# Patient Record
Sex: Female | Born: 1950 | Race: White | Hispanic: No | Marital: Married | Smoking: Never smoker
Health system: Southern US, Community
[De-identification: ages and names within clinical notes are randomized; demographics above are authoritative.]

## PROBLEM LIST (undated history)

## (undated) DIAGNOSIS — N183 Chronic kidney disease, stage 3 unspecified: Secondary | ICD-10-CM

## (undated) DIAGNOSIS — Z7901 Long term (current) use of anticoagulants: Secondary | ICD-10-CM

## (undated) DIAGNOSIS — K219 Gastro-esophageal reflux disease without esophagitis: Secondary | ICD-10-CM

## (undated) DIAGNOSIS — T502X5A Adverse effect of carbonic-anhydrase inhibitors, benzothiadiazides and other diuretics, initial encounter: Secondary | ICD-10-CM

## (undated) DIAGNOSIS — I480 Paroxysmal atrial fibrillation: Secondary | ICD-10-CM

## (undated) DIAGNOSIS — D649 Anemia, unspecified: Secondary | ICD-10-CM

## (undated) DIAGNOSIS — M109 Gout, unspecified: Secondary | ICD-10-CM

## (undated) DIAGNOSIS — M5137 Other intervertebral disc degeneration, lumbosacral region: Secondary | ICD-10-CM

## (undated) DIAGNOSIS — Q8989 Other specified congenital malformations: Secondary | ICD-10-CM

## (undated) DIAGNOSIS — M069 Rheumatoid arthritis, unspecified: Secondary | ICD-10-CM

## (undated) DIAGNOSIS — I700 Atherosclerosis of aorta: Secondary | ICD-10-CM

## (undated) DIAGNOSIS — J449 Chronic obstructive pulmonary disease, unspecified: Secondary | ICD-10-CM

## (undated) DIAGNOSIS — I10 Essential (primary) hypertension: Secondary | ICD-10-CM

## (undated) DIAGNOSIS — G4733 Obstructive sleep apnea (adult) (pediatric): Secondary | ICD-10-CM

## (undated) DIAGNOSIS — Z8739 Personal history of other diseases of the musculoskeletal system and connective tissue: Secondary | ICD-10-CM

## (undated) DIAGNOSIS — F1190 Opioid use, unspecified, uncomplicated: Secondary | ICD-10-CM

## (undated) DIAGNOSIS — F40240 Claustrophobia: Secondary | ICD-10-CM

## (undated) DIAGNOSIS — M797 Fibromyalgia: Secondary | ICD-10-CM

## (undated) DIAGNOSIS — C4491 Basal cell carcinoma of skin, unspecified: Secondary | ICD-10-CM

## (undated) DIAGNOSIS — Z79899 Other long term (current) drug therapy: Secondary | ICD-10-CM

## (undated) DIAGNOSIS — Z7960 Long term (current) use of unspecified immunomodulators and immunosuppressants: Secondary | ICD-10-CM

## (undated) DIAGNOSIS — I5030 Unspecified diastolic (congestive) heart failure: Secondary | ICD-10-CM

## (undated) DIAGNOSIS — E876 Hypokalemia: Secondary | ICD-10-CM

## (undated) DIAGNOSIS — R7303 Prediabetes: Secondary | ICD-10-CM

## (undated) DIAGNOSIS — M51379 Other intervertebral disc degeneration, lumbosacral region without mention of lumbar back pain or lower extremity pain: Secondary | ICD-10-CM

## (undated) DIAGNOSIS — G43909 Migraine, unspecified, not intractable, without status migrainosus: Secondary | ICD-10-CM

## (undated) DIAGNOSIS — M75122 Complete rotator cuff tear or rupture of left shoulder, not specified as traumatic: Secondary | ICD-10-CM

## (undated) DIAGNOSIS — F329 Major depressive disorder, single episode, unspecified: Secondary | ICD-10-CM

## (undated) DIAGNOSIS — I5032 Chronic diastolic (congestive) heart failure: Secondary | ICD-10-CM

## (undated) DIAGNOSIS — M1990 Unspecified osteoarthritis, unspecified site: Secondary | ICD-10-CM

## (undated) DIAGNOSIS — J189 Pneumonia, unspecified organism: Secondary | ICD-10-CM

## (undated) DIAGNOSIS — G4730 Sleep apnea, unspecified: Secondary | ICD-10-CM

## (undated) DIAGNOSIS — R296 Repeated falls: Secondary | ICD-10-CM

## (undated) DIAGNOSIS — M359 Systemic involvement of connective tissue, unspecified: Secondary | ICD-10-CM

## (undated) DIAGNOSIS — I499 Cardiac arrhythmia, unspecified: Secondary | ICD-10-CM

## (undated) DIAGNOSIS — Q898 Other specified congenital malformations: Secondary | ICD-10-CM

## (undated) DIAGNOSIS — K635 Polyp of colon: Secondary | ICD-10-CM

## (undated) DIAGNOSIS — R51 Headache: Secondary | ICD-10-CM

## (undated) DIAGNOSIS — I2510 Atherosclerotic heart disease of native coronary artery without angina pectoris: Secondary | ICD-10-CM

## (undated) DIAGNOSIS — Z9889 Other specified postprocedural states: Secondary | ICD-10-CM

## (undated) DIAGNOSIS — I5189 Other ill-defined heart diseases: Secondary | ICD-10-CM

## (undated) DIAGNOSIS — H269 Unspecified cataract: Secondary | ICD-10-CM

## (undated) DIAGNOSIS — E785 Hyperlipidemia, unspecified: Secondary | ICD-10-CM

## (undated) DIAGNOSIS — R519 Headache, unspecified: Secondary | ICD-10-CM

## (undated) HISTORY — DX: Polyp of colon: K63.5

## (undated) HISTORY — DX: Chronic kidney disease, stage 3 unspecified: N18.30

## (undated) HISTORY — DX: Basal cell carcinoma of skin, unspecified: C44.91

## (undated) HISTORY — DX: Sleep apnea, unspecified: G47.30

## (undated) HISTORY — DX: Anemia, unspecified: D64.9

## (undated) HISTORY — PX: KNEE ARTHROSCOPY: SHX127

## (undated) HISTORY — DX: Rheumatoid arthritis, unspecified: M06.9

## (undated) HISTORY — DX: Other specified congenital malformations: Q89.8

## (undated) HISTORY — DX: Hypokalemia: E87.6

## (undated) HISTORY — PX: SINUSOTOMY: SHX291

## (undated) HISTORY — PX: CHOLECYSTECTOMY: SHX55

## (undated) HISTORY — DX: Morbid (severe) obesity due to excess calories: E66.01

## (undated) HISTORY — PX: EYE SURGERY: SHX253

## (undated) HISTORY — PX: ABDOMINAL HYSTERECTOMY: SHX81

## (undated) HISTORY — DX: Other specified congenital malformations: Q89.89

## (undated) HISTORY — DX: Personal history of other diseases of the musculoskeletal system and connective tissue: Z87.39

## (undated) HISTORY — DX: Unspecified cataract: H26.9

## (undated) HISTORY — DX: Chronic diastolic (congestive) heart failure: I50.32

## (undated) HISTORY — PX: VAGINAL HYSTERECTOMY: SHX2639

## (undated) HISTORY — DX: Hyperlipidemia, unspecified: E78.5

## (undated) HISTORY — DX: Essential (primary) hypertension: I10

---

## 1998-06-15 ENCOUNTER — Other Ambulatory Visit: Admission: RE | Admit: 1998-06-15 | Discharge: 1998-06-15 | Payer: Self-pay | Admitting: Obstetrics and Gynecology

## 1998-11-04 ENCOUNTER — Encounter: Payer: Self-pay | Admitting: Emergency Medicine

## 1998-11-04 ENCOUNTER — Emergency Department (HOSPITAL_COMMUNITY): Admission: EM | Admit: 1998-11-04 | Discharge: 1998-11-04 | Payer: Self-pay | Admitting: Emergency Medicine

## 1999-08-01 ENCOUNTER — Encounter: Payer: Self-pay | Admitting: Emergency Medicine

## 1999-08-01 ENCOUNTER — Encounter: Admission: RE | Admit: 1999-08-01 | Discharge: 1999-08-01 | Payer: Self-pay | Admitting: Emergency Medicine

## 1999-08-30 ENCOUNTER — Encounter: Payer: Self-pay | Admitting: Emergency Medicine

## 1999-08-30 ENCOUNTER — Emergency Department (HOSPITAL_COMMUNITY): Admission: EM | Admit: 1999-08-30 | Discharge: 1999-08-30 | Payer: Self-pay | Admitting: Emergency Medicine

## 1999-09-14 ENCOUNTER — Encounter: Payer: Self-pay | Admitting: Emergency Medicine

## 1999-09-14 ENCOUNTER — Encounter: Admission: RE | Admit: 1999-09-14 | Discharge: 1999-09-14 | Payer: Self-pay | Admitting: Emergency Medicine

## 1999-12-01 ENCOUNTER — Other Ambulatory Visit: Admission: RE | Admit: 1999-12-01 | Discharge: 1999-12-01 | Payer: Self-pay | Admitting: Obstetrics and Gynecology

## 2000-02-04 ENCOUNTER — Encounter: Payer: Self-pay | Admitting: Emergency Medicine

## 2000-03-07 ENCOUNTER — Ambulatory Visit (HOSPITAL_COMMUNITY): Admission: RE | Admit: 2000-03-07 | Discharge: 2000-03-07 | Payer: Self-pay | Admitting: Orthopedic Surgery

## 2000-11-15 ENCOUNTER — Ambulatory Visit (HOSPITAL_COMMUNITY): Admission: RE | Admit: 2000-11-15 | Discharge: 2000-11-15 | Payer: Self-pay | Admitting: Gastroenterology

## 2000-11-15 ENCOUNTER — Encounter (INDEPENDENT_AMBULATORY_CARE_PROVIDER_SITE_OTHER): Payer: Self-pay | Admitting: *Deleted

## 2000-11-30 ENCOUNTER — Encounter: Payer: Self-pay | Admitting: Emergency Medicine

## 2000-11-30 ENCOUNTER — Encounter: Admission: RE | Admit: 2000-11-30 | Discharge: 2000-11-30 | Payer: Self-pay | Admitting: Emergency Medicine

## 2000-12-05 ENCOUNTER — Other Ambulatory Visit: Admission: RE | Admit: 2000-12-05 | Discharge: 2000-12-05 | Payer: Self-pay | Admitting: Obstetrics and Gynecology

## 2001-02-27 DIAGNOSIS — K635 Polyp of colon: Secondary | ICD-10-CM

## 2001-02-27 HISTORY — DX: Polyp of colon: K63.5

## 2002-01-02 ENCOUNTER — Other Ambulatory Visit: Admission: RE | Admit: 2002-01-02 | Discharge: 2002-01-02 | Payer: Self-pay | Admitting: Obstetrics and Gynecology

## 2002-08-21 ENCOUNTER — Encounter: Payer: Self-pay | Admitting: Emergency Medicine

## 2002-08-21 ENCOUNTER — Encounter: Admission: RE | Admit: 2002-08-21 | Discharge: 2002-08-21 | Payer: Self-pay | Admitting: Emergency Medicine

## 2003-01-14 ENCOUNTER — Other Ambulatory Visit: Admission: RE | Admit: 2003-01-14 | Discharge: 2003-01-14 | Payer: Self-pay | Admitting: Obstetrics and Gynecology

## 2003-01-20 ENCOUNTER — Encounter: Admission: RE | Admit: 2003-01-20 | Discharge: 2003-01-20 | Payer: Self-pay | Admitting: Emergency Medicine

## 2003-12-22 ENCOUNTER — Encounter: Admission: RE | Admit: 2003-12-22 | Discharge: 2003-12-22 | Payer: Self-pay | Admitting: Emergency Medicine

## 2004-01-26 ENCOUNTER — Encounter: Admission: RE | Admit: 2004-01-26 | Discharge: 2004-01-26 | Payer: Self-pay | Admitting: Emergency Medicine

## 2004-02-02 ENCOUNTER — Encounter: Admission: RE | Admit: 2004-02-02 | Discharge: 2004-02-02 | Payer: Self-pay | Admitting: Emergency Medicine

## 2004-05-16 ENCOUNTER — Encounter: Admission: RE | Admit: 2004-05-16 | Discharge: 2004-05-16 | Payer: Self-pay | Admitting: Emergency Medicine

## 2004-05-18 ENCOUNTER — Ambulatory Visit (HOSPITAL_COMMUNITY): Admission: RE | Admit: 2004-05-18 | Discharge: 2004-05-18 | Payer: Self-pay | Admitting: Emergency Medicine

## 2004-08-13 ENCOUNTER — Encounter: Admission: RE | Admit: 2004-08-13 | Discharge: 2004-08-13 | Payer: Self-pay | Admitting: Emergency Medicine

## 2005-04-10 ENCOUNTER — Encounter: Admission: RE | Admit: 2005-04-10 | Discharge: 2005-04-10 | Payer: Self-pay | Admitting: Emergency Medicine

## 2006-01-02 ENCOUNTER — Encounter: Admission: RE | Admit: 2006-01-02 | Discharge: 2006-01-02 | Payer: Self-pay | Admitting: Emergency Medicine

## 2006-01-23 ENCOUNTER — Encounter: Admission: RE | Admit: 2006-01-23 | Discharge: 2006-01-23 | Payer: Self-pay | Admitting: Emergency Medicine

## 2007-02-04 ENCOUNTER — Encounter: Admission: RE | Admit: 2007-02-04 | Discharge: 2007-02-04 | Payer: Self-pay | Admitting: Emergency Medicine

## 2007-03-15 ENCOUNTER — Encounter: Payer: Self-pay | Admitting: Orthopedic Surgery

## 2007-03-31 ENCOUNTER — Encounter: Payer: Self-pay | Admitting: Orthopedic Surgery

## 2007-05-06 ENCOUNTER — Encounter: Admission: RE | Admit: 2007-05-06 | Discharge: 2007-05-06 | Payer: Self-pay | Admitting: Orthopedic Surgery

## 2007-10-16 ENCOUNTER — Encounter: Admission: RE | Admit: 2007-10-16 | Discharge: 2007-10-16 | Payer: Self-pay | Admitting: Emergency Medicine

## 2009-08-09 ENCOUNTER — Encounter: Admission: RE | Admit: 2009-08-09 | Discharge: 2009-08-09 | Payer: Self-pay | Admitting: Emergency Medicine

## 2010-02-02 ENCOUNTER — Encounter: Payer: Self-pay | Admitting: Emergency Medicine

## 2010-02-27 ENCOUNTER — Encounter: Payer: Self-pay | Admitting: Emergency Medicine

## 2010-03-30 ENCOUNTER — Encounter: Payer: Self-pay | Admitting: Emergency Medicine

## 2010-05-23 ENCOUNTER — Inpatient Hospital Stay (HOSPITAL_COMMUNITY)
Admission: EM | Admit: 2010-05-23 | Discharge: 2010-05-25 | DRG: 544 | Disposition: A | Discharge status: Home / Self Care | Payer: Federal, State, Local not specified - PPO | Source: Ambulatory Visit | Attending: Cardiology | Admitting: Cardiology

## 2010-05-23 ENCOUNTER — Emergency Department (HOSPITAL_COMMUNITY): Payer: Federal, State, Local not specified - PPO

## 2010-05-23 DIAGNOSIS — J4489 Other specified chronic obstructive pulmonary disease: Secondary | ICD-10-CM | POA: Diagnosis present

## 2010-05-23 DIAGNOSIS — I2510 Atherosclerotic heart disease of native coronary artery without angina pectoris: Secondary | ICD-10-CM | POA: Diagnosis present

## 2010-05-23 DIAGNOSIS — Z7901 Long term (current) use of anticoagulants: Secondary | ICD-10-CM

## 2010-05-23 DIAGNOSIS — I5032 Chronic diastolic (congestive) heart failure: Secondary | ICD-10-CM | POA: Diagnosis present

## 2010-05-23 DIAGNOSIS — M810 Age-related osteoporosis without current pathological fracture: Secondary | ICD-10-CM | POA: Diagnosis present

## 2010-05-23 DIAGNOSIS — R0789 Other chest pain: Secondary | ICD-10-CM

## 2010-05-23 DIAGNOSIS — I10 Essential (primary) hypertension: Secondary | ICD-10-CM | POA: Diagnosis present

## 2010-05-23 DIAGNOSIS — Z880 Allergy status to penicillin: Secondary | ICD-10-CM

## 2010-05-23 DIAGNOSIS — E785 Hyperlipidemia, unspecified: Secondary | ICD-10-CM | POA: Diagnosis present

## 2010-05-23 DIAGNOSIS — J449 Chronic obstructive pulmonary disease, unspecified: Secondary | ICD-10-CM | POA: Diagnosis present

## 2010-05-23 DIAGNOSIS — I509 Heart failure, unspecified: Secondary | ICD-10-CM | POA: Diagnosis present

## 2010-05-23 DIAGNOSIS — M069 Rheumatoid arthritis, unspecified: Secondary | ICD-10-CM | POA: Diagnosis present

## 2010-05-23 DIAGNOSIS — G2581 Restless legs syndrome: Secondary | ICD-10-CM | POA: Diagnosis present

## 2010-05-23 DIAGNOSIS — E876 Hypokalemia: Secondary | ICD-10-CM | POA: Diagnosis present

## 2010-05-23 DIAGNOSIS — Z6841 Body Mass Index (BMI) 40.0 and over, adult: Secondary | ICD-10-CM

## 2010-05-23 DIAGNOSIS — I4891 Unspecified atrial fibrillation: Principal | ICD-10-CM | POA: Diagnosis present

## 2010-05-23 LAB — DIFFERENTIAL
Basophils Absolute: 0.1 10*3/uL (ref 0.0–0.1)
Basophils Relative: 1 % (ref 0–1)
Eosinophils Absolute: 0.7 10*3/uL (ref 0.0–0.7)
Eosinophils Relative: 11 % — ABNORMAL HIGH (ref 0–5)
Lymphocytes Relative: 42 % (ref 12–46)
Lymphs Abs: 2.6 10*3/uL (ref 0.7–4.0)
Monocytes Absolute: 0.6 10*3/uL (ref 0.1–1.0)
Monocytes Relative: 9 % (ref 3–12)
Neutro Abs: 2.3 10*3/uL (ref 1.7–7.7)
Neutrophils Relative %: 38 % — ABNORMAL LOW (ref 43–77)

## 2010-05-23 LAB — BASIC METABOLIC PANEL
BUN: 21 mg/dL (ref 6–23)
CO2: 31 mEq/L (ref 19–32)
Calcium: 9.7 mg/dL (ref 8.4–10.5)
Chloride: 92 mEq/L — ABNORMAL LOW (ref 96–112)
Creatinine, Ser: 0.86 mg/dL (ref 0.4–1.2)
GFR calc Af Amer: >60 mL/min (ref >60)
GFR calc non Af Amer: >60 mL/min (ref >60)
Glucose, Bld: 131 mg/dL — ABNORMAL HIGH (ref 70–99)
Potassium: 2.4 mEq/L — CL (ref 3.5–5.1)
Sodium: 135 mEq/L (ref 135–145)

## 2010-05-23 LAB — CK TOTAL AND CKMB (NOT AT ARMC)
CK, MB: 2.8 ng/mL (ref 0.3–4.0)
CK, MB: 4 ng/mL (ref 0.3–4.0)
Relative Index: 2.4 (ref 0.0–2.5)
Relative Index: 3.3 — ABNORMAL HIGH (ref 0.0–2.5)
Total CK: 115 U/L (ref 7–177)
Total CK: 121 U/L (ref 7–177)

## 2010-05-23 LAB — CBC
HCT: 40.5 % (ref 36.0–46.0)
Hemoglobin: 13.2 g/dL (ref 12.0–15.0)
MCH: 28 pg (ref 26.0–34.0)
MCHC: 32.6 g/dL (ref 30.0–36.0)
MCV: 85.8 fL (ref 78.0–100.0)
Platelets: 220 10*3/uL (ref 150–400)
RBC: 4.72 MIL/uL (ref 3.87–5.11)
RDW: 16.1 % — ABNORMAL HIGH (ref 11.5–15.5)
WBC: 6.2 10*3/uL (ref 4.0–10.5)

## 2010-05-23 LAB — MAGNESIUM: Magnesium: 1.9 mg/dL (ref 1.5–2.5)

## 2010-05-23 LAB — TROPONIN I: Troponin I: 0.03 ng/mL (ref 0.00–0.06)

## 2010-05-23 LAB — TSH: TSH: 4.799 u[IU]/mL — ABNORMAL HIGH (ref 0.350–4.500)

## 2010-05-23 LAB — HEPARIN LEVEL (UNFRACTIONATED): Heparin Unfractionated: <0.1 IU/mL — ABNORMAL LOW (ref 0.30–0.70)

## 2010-05-23 LAB — PROTIME-INR: Prothrombin Time: 12.6 seconds (ref 11.6–15.2)

## 2010-05-24 DIAGNOSIS — I2510 Atherosclerotic heart disease of native coronary artery without angina pectoris: Secondary | ICD-10-CM

## 2010-05-24 DIAGNOSIS — I517 Cardiomegaly: Secondary | ICD-10-CM

## 2010-05-24 HISTORY — PX: CARDIAC CATHETERIZATION: SHX172

## 2010-05-24 LAB — CARDIAC PANEL(CRET KIN+CKTOT+MB+TROPI)
CK, MB: 3.8 ng/mL (ref 0.3–4.0)
CK, MB: 3.1 ng/mL (ref 0.3–4.0)
Relative Index: 3.6 — ABNORMAL HIGH (ref 0.0–2.5)
Relative Index: 2.2 (ref 0.0–2.5)
Total CK: 107 U/L (ref 7–177)
Total CK: 144 U/L (ref 7–177)

## 2010-05-24 LAB — HEMOGLOBIN A1C: Hgb A1c MFr Bld: 6.3 % — ABNORMAL HIGH (ref <5.7)

## 2010-05-24 LAB — LIPID PANEL
Cholesterol: 142 mg/dL (ref 0–200)
LDL Cholesterol: 62 mg/dL (ref 0–99)
Total CHOL/HDL Ratio: 3.4 RATIO

## 2010-05-24 LAB — HEPARIN LEVEL (UNFRACTIONATED): Heparin Unfractionated: <0.1 IU/mL — ABNORMAL LOW (ref 0.30–0.70)

## 2010-05-24 LAB — BASIC METABOLIC PANEL
BUN: 15 mg/dL (ref 6–23)
Calcium: 9.7 mg/dL (ref 8.4–10.5)
Creatinine, Ser: 0.68 mg/dL (ref 0.4–1.2)
GFR calc Af Amer: >60 mL/min (ref >60)
GFR calc non Af Amer: >60 mL/min (ref >60)

## 2010-05-24 LAB — CBC
MCH: 28.8 pg (ref 26.0–34.0)
MCHC: 33.6 g/dL (ref 30.0–36.0)
Platelets: 210 10*3/uL (ref 150–400)

## 2010-05-24 LAB — POTASSIUM: Potassium: 3.6 mEq/L (ref 3.5–5.1)

## 2010-05-24 NOTE — H&P (Addendum)
NAME:  Tina Pacheco, Tina Pacheco               ACCOUNT NO.:  [id number]  MEDICAL RECORD NO.:  [id number]           PATIENT TYPE:  E  LOCATION:  [facility]                         FACILITY:  [facility]  PHYSICIAN:  Madolyn Frieze. Jens Som, MD, FACCDATE OF BIRTH:  31-Oct-1950  DATE OF ADMISSION:  05/23/2010 DATE OF DISCHARGE:                             HISTORY & PHYSICAL   PRIMARY CARDIOLOGIST:  New.  PRIMARY MEDICAL DOCTOR:  Reuben Likes, MD  CHIEF COMPLAINT:  Chest tightness.  HISTORY OF PRESENT ILLNESS:  Ms. Kommer is a 60 year old female with no prior cardiac history with history of rheumatoid arthritis, __________ hypertension and hyperlipidemia who was admitted with complaints of chest tightness.  This morning, she woke up at 5 o'clock as usual.  She went to the bathroom.  While starting to walk back to __________ her bedroom, she began feeling funny.  She denies any exertional chest pain that induces this sort chest tightness and discomfort associated with bilateral arm pain.  She has had arm pain before with prior bursitis, but has never had chest tightness before. She became worried and called EMS.  No nausea, vomiting, diaphoresis, or shortness of breath.  She felt somewhat shaky, continues to feel shaky now.  Initial EKG showed atrial fibrillation with RVR with rate in the 130s.  EKG showed normal sinus rhythm with no acute changes.  She is found to have a potassium of 2.4.  She was given baby aspirin as well as 2 sublingual nitroglycerin and after about 20 minutes, her chest pain resolved completely.  She does get short of breath on occasion, but has been doing better intermittently, which has included going up and down, several steps of stairs without chest pain.  Pain is not worse with inspiration.  Cardiac enzymes are negative x1 and a second set showed troponin is 0.13 with negative CKs and MBs.  PAST MEDICAL HISTORY: 1. Rheumatoid arthritis. 2. Hypertension. 3. Hyperlipidemia. 4.  Asthma/COPD. 5. Basal cell carcinoma due to 2011. 6. Hyperplastic colon polyp in 2003. 7. Possible sleep apnea, has not been normally evaluated. 8. Status post knee arthroscopy bilaterally. 9. Status post hysterectomy. 10.Status post cholecystectomy. 11.Status post C-section.  The patient denies any prior history of known heart problems including heart workup.  OUTPATIENT MEDICATIONS: 1. Trazodone 10 mg at bedtime. 2. Amlodipine 10 mg daily. 3. Leflunomide 20 mg daily. 4. Lyrica 75 mg t.i.d. 5. Meloxicam 7.5 mg daily. 6. Methotrexate 2.5 mg 6 tablets once a week. 7. Metolazone 2.5 mg every other day. 8. Nexium 40 mg p.r.n. 9. Oxybutynin 10 mg b.i.d. 10.Torsemide 20 mg daily. 11.Singulair 10 mg daily.  The patient has received 40 mEq x2 in the ER as well as started on heparin per pharmacy.  She states she was recently changed from Hyzaar to torsemide and metolazone for lower extremity edema.  ALLERGIES:  She used to be allergic to PENICILLIN but apparently grew out of it and has tolerated PENICILLIN since.  SOCIAL HISTORY:  The patient is accompanied by her family today.  She denies any tobacco or drug use.  She drinks alcohol very rarely.  FAMILY HISTORY:  She  states her sister recently had a heart test, which she was told and need to be followed, but she does not know the details of this.  She noticed that her father had atherosclerosis with something to do with some sort of tests __________ but she does not know about this either.  REVIEW OF SYSTEMS:  No fevers, chills, sweats, shortness of breath, orthopnea, PND, nausea, vomiting, diarrhea, bright blood per rectum, melena, or hematemesis.  Positive for lower extremity edema and chest tightness as above.  All other systems reviewed and otherwise negative except as noted in the HPI.  LABORATORY DATA:  WBC is 6.2, hemoglobin 13.2, hematocrit 40.5, and platelet count 220.  Sodium 135,  potassium 2.4, chloride 92, CO2 is  31, glucose 131, BUN 21, creatinine 0.86.  Cardiac enzymes negative x1 and second set showed CK of 121, MB 4.0, troponin 0.13.  His initial EKG showed a AFib with RVR with a rate of 134 beats per minute.  Follow-up showed normal sinus rhythm with prolonged Q waves in III and aVF without acute changes.  RADIOLOGY:  Chest x-ray showed chronic bronchitis, chronic interstitial lung disease.  PHYSICAL EXAMINATION:  VITAL SIGNS:  Temperature 97.0, pulse 70, respirations 14, blood pressure 125/60, and pulse ox 98% on 3 liters. She was 97% on room air on admission. GENERAL:  This is a very pleasant white female in no acute distress. HEENT:  Normocephalic, atraumatic with extraocular movements intact. Sclerae clear.  Nares without discharge. NECK:  Supple without carotid bruit. HEART:  Auscultation of the heart reveals regular rate and rhythm with S1 and S2 without murmurs, rubs, or gallops. LUNGS:  Lung sounds are moderately decreased throughout, but clear without wheezes, rales, or rhonchi. ABDOMEN:  Soft, nontender and nondistended with positive bowel sounds. EXTREMITIES:  Warm and dry with 1+ lower extremity edema bilaterally. NEUROLOGICAL:  She is awake, alert, and oriented x3.  Responds to questions appropriately with a normal affect.  ASSESSMENT AND PLAN:  The patient was seen and examined by Dr. Jens Som and myself.  This is a 60 year old female with past medical history of hypertension and hyperlipidemia, chronic obstructive pulmonary disease/asthma and rheumatoid arthritis who was asked to evaluate in the ER for chest pain, as well as what appears to be the new onset of atrial fibrillation.  She has no prior cardiac history.  She went to the bathroom today this morning and developed chest tightness with radiation to both the upper extremities without nausea, shortness of breath or diaphoresis.  Pain is not pleuritic or positional.  It resolved after 20 minutes after aspirin and  sublingual nitro.  EKG shows AFib with inferior ST depression and converted to sinus spontaneously with EMS. Followup EKG shows normal sinus rhythm with no acute ST or T changes. Potassium is 2.4 and troponin is 0.13. 1. Atrial fibrillation.  The patient is at embolic risk with risk     factors of hypertension and __________ we will add metoprolol for     rate control and discontinue her amlodipine at this time.  She will     need Coumadin long-term after catheterization, so she will be     initiated on heparin per pharmacy.  We will check a TSH as well as     an echocardiogram. 2. Chest pain.  Positive troponin may be due to atrial fibrillation     but also associated with ST depression.  We feel cath is warranted     at this time.  Risks versus  benefits were requested with the     patient and she agrees to proceed this.  We will add aspirin and     heparin.  Lopressor will be started for rate control for AFib.  A     statin will be added pending the results of her lipid panel. 3. Hypokalemia.  We will hold her Demadex and Zaroxolyn.  She had been     taking these for lower extremity edema.  She already received the 8     mEq of potassium in the ER, so we will give her an additional 40 in     approximately 4 hours since her last dose.  She did not tolerate IV     potassium and is refusing it.  We will recheck her potassium in the     morning.  We would recommend to discontinue her Zaroxolyn long-     term, and resume her Demadex at discharge with close outpatient     followup of her potassium level.     Oneda Duffett, P.A.C.   ______________________________ Madolyn Frieze. Jens Som, MD, [facility]    DD/MEDQ  D:  05/23/2010  T:  05/24/2010  Job:  [id number]  cc:   Reuben Likes, M.D.  Electronically Signed by Olga Millers MD [facility] on 05/24/2010 05:09:58 PM Electronically Signed by Ronie Spies  on 06/01/2010 02:16:58 PM

## 2010-05-25 LAB — COMPREHENSIVE METABOLIC PANEL
ALT: 33 U/L (ref 0–35)
Alkaline Phosphatase: 83 U/L (ref 39–117)
BUN: 11 mg/dL (ref 6–23)
CO2: 25 mEq/L (ref 19–32)
Calcium: 9.6 mg/dL (ref 8.4–10.5)
GFR calc non Af Amer: >60 mL/min (ref >60)
Glucose, Bld: 100 mg/dL — ABNORMAL HIGH (ref 70–99)
Sodium: 136 mEq/L (ref 135–145)

## 2010-05-25 LAB — CBC
MCH: 28.1 pg (ref 26.0–34.0)
MCHC: 32.2 g/dL (ref 30.0–36.0)
MCV: 87.4 fL (ref 78.0–100.0)
Platelets: 218 10*3/uL (ref 150–400)
RBC: 4.3 MIL/uL (ref 3.87–5.11)

## 2010-05-27 ENCOUNTER — Other Ambulatory Visit (INDEPENDENT_AMBULATORY_CARE_PROVIDER_SITE_OTHER): Payer: Federal, State, Local not specified - PPO | Admitting: *Deleted

## 2010-05-27 DIAGNOSIS — Z7901 Long term (current) use of anticoagulants: Secondary | ICD-10-CM | POA: Insufficient documentation

## 2010-05-27 DIAGNOSIS — Z79899 Other long term (current) drug therapy: Secondary | ICD-10-CM

## 2010-05-27 DIAGNOSIS — I4891 Unspecified atrial fibrillation: Secondary | ICD-10-CM

## 2010-05-27 LAB — POCT INR: INR: 1

## 2010-05-27 LAB — BASIC METABOLIC PANEL
CO2: 26 mEq/L (ref 19–32)
Calcium: 9.4 mg/dL (ref 8.4–10.5)
Sodium: 140 mEq/L (ref 135–145)

## 2010-05-27 NOTE — Patient Instructions (Signed)
Continue on same dosage 5mg  daily.  Recheck on Wednesday.

## 2010-05-30 ENCOUNTER — Encounter: Payer: Self-pay | Admitting: Cardiovascular Disease

## 2010-05-30 NOTE — Procedures (Signed)
  NAME:  Tina Pacheco, Tina Pacheco               ACCOUNT NO.:  [id number]  MEDICAL RECORD NO.:  [id number]           PATIENT TYPE:  O  LOCATION:  6531                         FACILITY:  [facility]  PHYSICIAN:  Veverly Fells. Excell Seltzer, MD  DATE OF BIRTH:  Nov 26, 1950  DATE OF PROCEDURE:  05/24/2010 DATE OF DISCHARGE:                           CARDIAC CATHETERIZATION   PROCEDURE: 1. Left heart catheterization. 2. Selective coronary angiography. 3. Left ventricular angiography.  PROCEDURAL INDICATION:  [name] is a 60 year old obese woman who presented to the hospital with atrial fibrillation with RVR.  Her cardiac markers were elevated with a troponin up to 0.27.  She had chest pain during the time that she was an atrial fib and also had some ST depression on her ECG.  She was referred for cardiac catheterization to rule out obstructive CAD.  Risks and indication of the procedure were reviewed with the patient. Informed consent was obtained.  The right wrist was prepped, draped, and anesthetized with 1% lidocaine.  Using the modified Seldinger technique, a 5-French sheath was placed in the right radial artery.  5000 units of unfractionated heparin was administered intravenously, 3 mg of verapamil was administered through the radial sheath.  Standard Judkins catheters were used for coronary angiography and left ventriculography.  Catheter exchange were performed over guidewire.  The patient tolerated the procedure well.  There were no immediate complications.  PROCEDURAL FINDINGS:  Aortic pressure 165/77 with a mean of 114, left ventricular pressure 172/27.  CORONARY ANGIOGRAPHY:  The right coronary artery is patent.  There are minor luminal irregularities throughout the proximal and mid vessel. There is no high-grade stenosis present.  Distally, the vessel divides into a PDA and posterolateral branch.  There is no significant stenosis throughout the course of the right coronary artery.  Left  mainstem:  The left main is widely patent.  There is no significant obstructive disease.  The vessel divides into the LAD and left circumflex.  Left circumflex:  The left circumflex is a large vessel that gives off two obtuse marginal branches.  There is no stenosis throughout the territory of the left circumflex.  LAD:  The LAD is also a large vessel that courses to the LV apex.  The caliber of the LAD decreases after the first diagonal branch.  The vessel has 30% ostial stenosis and 30-40% mid stenosis, and the diagonal branches are widely patent.  There is no high-grade stenosis throughout the course of the LAD.  Left ventriculography shows normal LV function.  Ejection fraction is estimated at 60%.  There is no significant mitral regurgitation.  FINAL ASSESSMENT: 1. Mild nonobstructive coronary artery disease as described above. 2. Normal left ventricular systolic function.  RECOMMENDATIONS:  The patient will be started on Coumadin for atrial fibrillation.  If her rhythm and electrolytes are stable in the morning, she will likely be able to be discharged.     Veverly Fells. Excell Seltzer, MD     MDC/MEDQ  D:  05/24/2010  T:  05/25/2010  Job:  [id number]  Electronically Signed by Tonny Bollman MD on 05/30/2010 01:13:52 PM

## 2010-06-01 ENCOUNTER — Ambulatory Visit (INDEPENDENT_AMBULATORY_CARE_PROVIDER_SITE_OTHER): Payer: Federal, State, Local not specified - PPO | Admitting: Emergency Medicine

## 2010-06-01 ENCOUNTER — Telehealth: Payer: Self-pay

## 2010-06-01 DIAGNOSIS — Z7901 Long term (current) use of anticoagulants: Secondary | ICD-10-CM

## 2010-06-01 DIAGNOSIS — I4891 Unspecified atrial fibrillation: Secondary | ICD-10-CM

## 2010-06-01 NOTE — Telephone Encounter (Signed)
Pt states when she went to hospital via EMS her family did not bring her cholesterol med she had previously been rx by her primary MD Crestor unsure of dosage.  While in hospital pt was started on and discharged on Pravastatin 40mg  qd.  Pt wants to know should she stay on the Pravastatin or switch back to Crestor.  Please call and advise. Thanks

## 2010-06-01 NOTE — Telephone Encounter (Signed)
Spoke to pt, she has not been seen by our office yet since hospital other than coumadin visits. She has NP appt 4/25 with Dr. Mariah Milling. Advised pt to continue the Pravastatin (pt would rather do since cheaper), and she has bloodwork at PCP before f/u on 4/25. Told pt that she can discuss with Dr. Mariah Milling at visit depending on cholesterol results whether he would want to continue the Pravastatin. Pt ok with this.

## 2010-06-01 NOTE — Patient Instructions (Signed)
Start taking 1.5 tablets daily.  Recheck in 1 week.

## 2010-06-07 NOTE — Discharge Summary (Signed)
NAME:  Tina Pacheco, Tina Pacheco               ACCOUNT NO.:  [id number]  MEDICAL RECORD NO.:  [id number]           PATIENT TYPE:  O  LOCATION:  6531                         FACILITY:  [facility]  PHYSICIAN:  Madolyn Frieze. Jens Som, MD, FACCDATE OF BIRTH:  1950-07-21  DATE OF ADMISSION:  05/23/2010 DATE OF DISCHARGE:  05/25/2010                              DISCHARGE SUMMARY   PRIMARY CARDIOLOGIST:  Antonieta Iba, MD  PRIMARY CARE PHYSICIAN:  Reuben Likes, MD  DISCHARGE DIAGNOSES: 1. Nonobstructive coronary artery disease.     a.     Cardiac catheterization on May 24, 2010:  Mild      nonobstructive coronary artery disease with normal left      ventricular function. 2. Paroxysmal atrial fibrillation.     a.     Initiated on anticoagulation with Coumadin, follow up in      Coumadin Clinic in [place] on May 27, 2010, at 10 a.m., rate      controlled with Toprol 50 mg p.o. daily. 3. Elevated TSH with normal free T4 (TSH 4.799, free T4 1.13). 4. Chronic diastolic congestive heart failure.     a.     A 2-D echocardiogram on May 24, 2010:  Normal left      ventricular size with mild left ventricular hypertrophy, left      ventricular ejection fraction 60% to 65% with grade 2 diastolic      dysfunction, moderate with elevated left ventricular filling      pressure.  Normal right ventricular size and systolic function.      Aortic sclerosis without significant stenosis. 5. Dyslipidemia.     a.     Low-dose statin, Pravachol 40 mg p.o. at bedtime, liver      function and lipid tests scheduled for Jul 06, 2010, at 8:40 a.m. 6. Morbid obesity, body mass index 53.5. 7. Hypertension.     a.     Systolic blood pressure 123-167 this admission, diastolic      blood pressure 46-88 mmHg this admission. 8. Hypokalemia (potassium nadir 2.4).  SECONDARY DIAGNOSES: 1. Asthma/chronic obstructive pulmonary disease. 2. Rheumatoid arthritis. 3. Suspicion of sleep apnea. 4. History of basal cell  carcinoma. 5. Hyperplastic colonic polyp, 2003. 6. Status post knee arthroscopy bilaterally. 7. Status post hysterectomy. 8. Status post cholecystectomy. 9. Status post cesarean section.  ALLERGIES AND INTOLERANCES:  CODEINE (nausea/vomiting).  PROCEDURES: 1. EKG, May 23, 2010:  Atrial fibrillation with inferior ST     depression. 2. EKG, May 23, 2010:  NSR with no acute ST-T-wave changes. 3. Chest x-ray, May 23, 2010:  Chronic lung changes.  No acute     findings. 4. Cardiac catheterization, May 24, 2010:  LM widely patent.  LAD     with 30% offices stenosis, 30% to 40% mid stenosis, courses to the     apex.  Left circumflex large vessel with two OM branches without     stenosis.  RCA patent with minor luminal irregularities, divides     into PDA and posterolateral, no significant stenosis throughout.     LVEF normal at 60%. 5. A 2-D echocardiogram  on May 24, 2010:  Please see discharge     diagnoses section under diastolic congestive heart failure.  HISTORY OF PRESENT ILLNESS:  Tina Pacheco is a 60 year old female with the above-noted complex medical history and as of yet no history of coronary artery disease who presents for further evaluation of chest tightness.  The patient was in her usual state of health when she awoke at approximately 5 a.m. on date of presentation, walked to the bathroom, and upon return had "funny feeling."  She denied exertional symptoms but did have chest tightness and bilateral upper extremity discomfort.  She has not had similar symptoms in her chest before and became concerned and had contacted EMS.  She denied associated symptoms.  She was "shaky" only.  When EMS arrived, initial EKG showed atrial fibrillation with RVR into the 130s.  The patient was treated with baby aspirin as well as two sublingual nitroglycerin and her symptoms resolved after about 20 minutes.  The patient does describe history of shortness of breath but it  is intermittent, and if anything has been improving lately.  She denies any exertional chest discomfort recently or ever.  She denies any pleuritic component to her symptoms.  In the emergency department, she was noted to have a potassium of 2.4 and cardiac enzymes mildly elevated.  Plan was to admit the patient for following trend of cardiac enzymes as well as further eval/therapy of new diagnosis of paroxysmal atrial fibrillation which spontaneously converted prior to her admission.  The patient was admitted for cardiac enzyme trend review, and further eval/therapy of new onset atrial fibrillation.  HOSPITAL COURSE:  The patient was admitted and cardiac enzymes were cycled.  Cardiac enzymes had flat/downtrend with CK going from 121-107, MB going from 4.0-3.8, and troponins initially going up to 0.27 but then back down to 0.16 on third set (CK-MB relative index negative on third set of enzymes).  Check of lipids revealed total cholesterol of 142, triglycerides of 192, HDL of 42 and LDL of 62.  The patient initiated on low-dose statin therapy with liver function tests and lipid panel scheduled in 6 weeks in [place].  TSH was checked and noted to be mildly elevated at 4.799, but free T4 was within normal limits at 1.13.  The patient followed up by her primary care physician.  Due to the patient's thromboembolic risk given mild nonobstructive coronary disease, diastolic CHF, hypertension, and female sex, she was initiated on anticoagulation with Coumadin and will have INR checked in [place] this Friday, May 27, 2010.  Of note, a 2-D echocardiogram was checked and did show normal left ventricular systolic function with grade 2 diastolic dysfunction.  The patient's diastolic congestive heart failure meds will remain unchanged, but she will have a basic metabolic panel checked at the end of the week and will be followed closely in [place] by her primary cardiologist, Dr.  Julien Nordmann, followup scheduled for June 22, 2010, at 10 a.m.  The patient did receive instructions on how to use Zaroxolyn 2.5 mg p.r.n. for increased edema by Dr. Olga Millers.  On the morning of May 25, 2010, she was seen by Dr. Jens Som and deemed stable for discharge with meds and follow up as outlined in the following sections.  At the time of her discharge, she received new medication list, prescriptions, followup instructions, and postcath instructions.  All questions and concerns were addressed prior to her leaving the hospital.  DISCHARGE LABORATORY DATA:  WBC 6.6, HGB 12.1, HCT 37.6, PLT  count is 218,000.  Protime 13.2, INR 0.98.  Sodium 136, potassium 4.3, chloride 107, bicarb 25, BUN 11, creatinine 0.76, glucose 100.  Total bilirubin 1.8, alkaline phosphatase 83, AST 61, ALT 33, calcium 9.6, magnesium 1.9.  Hemoglobin A1c 6.3%.  First set of cardiac enzymes, CK 121, MB 4.0 with relative index of 3.3, and troponin of 0.13.  Second set, CK 107, MB 3.8, relative index 3.6, troponin 0.27.  Third set, CK 144, MB 3.1, relative index of 2.2, and troponin of 0.16.  Lipids, please see hospital course.  Free T4 1.13.  TSH 4.799.  FOLLOWUP PLANS AND APPOINTMENTS: 1. Coumadin Clinic[facility], May 27, 2010. 2. Primary care physician, Dr. Leslee Home, in 1-2 weeks (the patient     to contact and schedule). 3. Dr. Mariah Milling of [facility], June 22, 2010, at 10     a.m. 4. Liver function tests and lipid panel [facility], Jul 06, 2010, at 8:40 a.m. (the patient to be fasting).  DISCHARGE MEDICATIONS: 1. Coumadin 5 mg p.o. daily. 2. Potassium 20 mEq p.o. daily and extra dose if taking metolazone. 3. Metolazone 2.5 mg p.o. daily p.r.n. 4. Advair Diskus 500/50 one puff daily p.r.n. 5. Fish oil 1 capsule daily. 6. Folic acid 1 tablet daily. 7. Hydrocodone/APAP 5/500 one to two tablets p.o.  b.i.d. p.r.n. 8. Leflunomide 20 mg 1 tablet daily. 9. Lyrica 75 mg 1 capsule t.i.d. 10.Meloxicam 7.5 mg 1 tablet p.o. daily. 11.Methotrexate 2.5 mg 6 tablets p.o. weekly (every Saturday). 12.Multivitamin daily. 13.Nexium 40 mg 1 capsule p.o. daily p.r.n. 14.Odansetron 8 mg 1 tablet sublingual b.i.d. 15.Oxybutynin XL 10 mg 1 tablet p.o. daily. 16.Singulair 10 mg 1 tablet p.o. daily. 17.Terazosin 10 mg 1 tablet p.o. daily. 18.Torsemide 20 mg 1 tablet p.o. daily. 19.Tussionex 1 teaspoon q.12 h. p.r.n. 20.Vitamin D3 1 tablet daily. 21.Zolpidem 10 mg 1 tablet p.o. at bedtime p.r.n.  Duration of discharge encounter including physician time was 45 minutes.     Jarrett Ables, PAC   ______________________________ Madolyn Frieze. Jens Som, MD, [facility]    MS/MEDQ  D:  05/25/2010  T:  05/26/2010  Job:  [id number]  cc:   Antonieta Iba, MD Reuben Likes, M.D.  Electronically Signed by Jarrett Ables PAC on 06/02/2010 02:41:50 PM Electronically Signed by Olga Millers MD [facility] on 06/07/2010 05:43:49 PM

## 2010-06-08 ENCOUNTER — Ambulatory Visit (INDEPENDENT_AMBULATORY_CARE_PROVIDER_SITE_OTHER): Payer: Federal, State, Local not specified - PPO | Admitting: Emergency Medicine

## 2010-06-08 DIAGNOSIS — Z7901 Long term (current) use of anticoagulants: Secondary | ICD-10-CM

## 2010-06-08 DIAGNOSIS — I4891 Unspecified atrial fibrillation: Secondary | ICD-10-CM

## 2010-06-08 LAB — POCT INR: INR: 2

## 2010-06-13 ENCOUNTER — Other Ambulatory Visit: Payer: Self-pay | Admitting: Cardiovascular Disease

## 2010-06-13 ENCOUNTER — Other Ambulatory Visit: Payer: Self-pay | Admitting: Emergency Medicine

## 2010-06-13 MED ORDER — WARFARIN SODIUM 5 MG PO TABS: 5.0000 mg | ORAL_TABLET | Freq: Every day | ORAL | Status: DC

## 2010-06-13 MED ORDER — WARFARIN SODIUM 5 MG PO TABS: 5.0000 mg | ORAL_TABLET | ORAL | Status: DC

## 2010-06-15 ENCOUNTER — Ambulatory Visit (INDEPENDENT_AMBULATORY_CARE_PROVIDER_SITE_OTHER): Payer: Federal, State, Local not specified - PPO | Admitting: Emergency Medicine

## 2010-06-15 DIAGNOSIS — Z7901 Long term (current) use of anticoagulants: Secondary | ICD-10-CM

## 2010-06-15 DIAGNOSIS — I4891 Unspecified atrial fibrillation: Secondary | ICD-10-CM

## 2010-06-15 LAB — POCT INR: INR: 3.6

## 2010-06-22 ENCOUNTER — Ambulatory Visit (INDEPENDENT_AMBULATORY_CARE_PROVIDER_SITE_OTHER): Payer: Federal, State, Local not specified - PPO | Admitting: Emergency Medicine

## 2010-06-22 ENCOUNTER — Encounter: Payer: Self-pay | Admitting: Cardiovascular Disease

## 2010-06-22 ENCOUNTER — Ambulatory Visit (INDEPENDENT_AMBULATORY_CARE_PROVIDER_SITE_OTHER): Payer: Federal, State, Local not specified - PPO | Admitting: Cardiovascular Disease

## 2010-06-22 DIAGNOSIS — I2510 Atherosclerotic heart disease of native coronary artery without angina pectoris: Secondary | ICD-10-CM | POA: Insufficient documentation

## 2010-06-22 DIAGNOSIS — I519 Heart disease, unspecified: Secondary | ICD-10-CM

## 2010-06-22 DIAGNOSIS — E669 Obesity, unspecified: Secondary | ICD-10-CM

## 2010-06-22 DIAGNOSIS — E785 Hyperlipidemia, unspecified: Secondary | ICD-10-CM | POA: Insufficient documentation

## 2010-06-22 DIAGNOSIS — I5189 Other ill-defined heart diseases: Secondary | ICD-10-CM | POA: Insufficient documentation

## 2010-06-22 DIAGNOSIS — I4891 Unspecified atrial fibrillation: Secondary | ICD-10-CM

## 2010-06-22 DIAGNOSIS — Z7901 Long term (current) use of anticoagulants: Secondary | ICD-10-CM

## 2010-06-22 DIAGNOSIS — I10 Essential (primary) hypertension: Secondary | ICD-10-CM | POA: Insufficient documentation

## 2010-06-22 NOTE — Progress Notes (Signed)
   Patient ID: Tina Pacheco, female    DOB: October 16, 1950, 60 y.o.   MRN: [id number]  HPI Comments: Tina Pacheco is a 60 year old woman who was recently admitted to the hospital at [facility] on March 26 for paroxysmal atrial fibrillation, echocardiogram showing normal LV unction with diastolic dysfunction, hyperlipidemia, obesity, hypertension, cardiac catheterization March 27 showing mild nonobstructive coronary artery disease also a history of asthma/COPD, suspected sleep apnea, rheumatoid arthritis who presents to establish care.  Since her discharge, she reports that she has been doing well. She has developed a sinus infection. She denies any significant worsening of her shortness of breath. Her weight has been stable. She reports that a dry weight for her is approximately 300 pounds. She had gained significant weight up to 325 pounds. She takes metolazone and torsemide for swelling. Currently she feels it is reasonably well controlled. She was started on metoprolol succinate 50 mg daily in the hospital for rate and rhythm control. This has caused some mild fatigue.  In the hospital, with diuresis, she had hypokalemia. She was also started on warfarin. Since her discharge, her most recent INR was greater than 4.  Most recent EKG done last week with Dr. Lorenz Coaster shows normal sinus rhythm with rate of 59 beats per minute with no significant ST or T wave changes     Review of Systems  Constitutional: Negative.   HENT: Positive for congestion, rhinorrhea and postnasal drip.   Eyes: Negative.   Respiratory: Positive for shortness of breath.   Cardiovascular: Positive for leg swelling.  Gastrointestinal: Negative.   Musculoskeletal: Negative.   Skin: Negative.   Neurological: Negative.   Hematological: Negative.   Psychiatric/Behavioral: Negative.   All other systems reviewed and are negative.    BP 128/72  Pulse 60  Ht 5' 4.5" (1.638 m)  Wt 311 lb (141.069 kg)  BMI 52.56  kg/m2   Physical Exam  Nursing note and vitals reviewed. Constitutional: She is oriented to person, place, and time. She appears well-developed and well-nourished.       Obesity.  HENT:  Head: Normocephalic.  Nose: Nose normal.  Mouth/Throat: Oropharynx is clear and moist.  Eyes: Conjunctivae are normal. Pupils are equal, round, and reactive to light.  Neck: Normal range of motion. Neck supple. No JVD present.  Cardiovascular: Normal rate, regular rhythm, S1 normal, S2 normal, normal heart sounds and intact distal pulses.  Exam reveals no gallop and no friction rub.   No murmur heard. Pulmonary/Chest: Effort normal and breath sounds normal. No respiratory distress. She has no wheezes. She has no rales. She exhibits no tenderness.  Abdominal: Soft. Bowel sounds are normal. She exhibits no distension. There is no tenderness.  Musculoskeletal: Normal range of motion. She exhibits edema. She exhibits no tenderness.  Lymphadenopathy:    She has no cervical adenopathy.  Neurological: She is alert and oriented to person, place, and time. Coordination normal.  Skin: Skin is warm and dry. No rash noted. No erythema.  Psychiatric: She has a normal mood and affect. Her behavior is normal. Judgment and thought content normal.         Assessment and Plan

## 2010-06-22 NOTE — Assessment & Plan Note (Signed)
We have suggested she stay on her current medication regimen. She was started on Pravachol.

## 2010-06-22 NOTE — Assessment & Plan Note (Addendum)
Rhythm is sinus on clinical exam, recent EKG showing sinus rhythm. We'll continue metoprolol. She is on anticoagulation. If she holds sinus rhythm, we could possibly discontinue her warfarin in the future.

## 2010-06-22 NOTE — Assessment & Plan Note (Signed)
Blood pressure is well controlled on today's visit. No changes made to the medications. 

## 2010-06-22 NOTE — Assessment & Plan Note (Signed)
Currently with no symptoms of angina. No further workup at this time. Continue current medication regimen. 

## 2010-06-22 NOTE — Assessment & Plan Note (Signed)
We'll continue to discuss her weight with her on future visits. She may benefit from surgical intervention if she is unable to lose weight.

## 2010-06-22 NOTE — Patient Instructions (Signed)
You are doing well. No medication changes were made. Please call us if you have new issues that need to be addressed before your next appt.  We will call you for a follow up Appt. In 3 months

## 2010-06-22 NOTE — Assessment & Plan Note (Signed)
History of diastolic dysfunction. Also seen on echocardiogram. She will need chronic diuresis, need to watch her salt and fluid intake. She can take metolazone as needed, torsemide daily.

## 2010-06-29 ENCOUNTER — Ambulatory Visit (INDEPENDENT_AMBULATORY_CARE_PROVIDER_SITE_OTHER): Payer: Federal, State, Local not specified - PPO | Admitting: Emergency Medicine

## 2010-06-29 DIAGNOSIS — I4891 Unspecified atrial fibrillation: Secondary | ICD-10-CM

## 2010-06-29 DIAGNOSIS — Z7901 Long term (current) use of anticoagulants: Secondary | ICD-10-CM

## 2010-07-06 ENCOUNTER — Ambulatory Visit (INDEPENDENT_AMBULATORY_CARE_PROVIDER_SITE_OTHER): Payer: Federal, State, Local not specified - PPO | Admitting: Emergency Medicine

## 2010-07-06 ENCOUNTER — Other Ambulatory Visit (INDEPENDENT_AMBULATORY_CARE_PROVIDER_SITE_OTHER): Payer: Federal, State, Local not specified - PPO | Admitting: *Deleted

## 2010-07-06 DIAGNOSIS — Z7901 Long term (current) use of anticoagulants: Secondary | ICD-10-CM

## 2010-07-06 DIAGNOSIS — E785 Hyperlipidemia, unspecified: Secondary | ICD-10-CM

## 2010-07-06 DIAGNOSIS — I4891 Unspecified atrial fibrillation: Secondary | ICD-10-CM

## 2010-07-06 LAB — LIPID PANEL
Cholesterol: 140 mg/dL (ref 0–200)
HDL: 38 mg/dL — ABNORMAL LOW (ref >39)
LDL Cholesterol: 59 mg/dL (ref 0–99)
Total CHOL/HDL Ratio: 3.7 Ratio
Triglycerides: 213 mg/dL — ABNORMAL HIGH (ref <150)
VLDL: 43 mg/dL — ABNORMAL HIGH (ref 0–40)

## 2010-07-06 LAB — HEPATIC FUNCTION PANEL
ALT: 25 U/L (ref 0–35)
Albumin: 4 g/dL (ref 3.5–5.2)
Total Protein: 7.2 g/dL (ref 6.0–8.3)

## 2010-07-12 ENCOUNTER — Encounter: Payer: Self-pay | Admitting: Cardiovascular Disease

## 2010-07-13 ENCOUNTER — Ambulatory Visit (INDEPENDENT_AMBULATORY_CARE_PROVIDER_SITE_OTHER): Payer: Federal, State, Local not specified - PPO | Admitting: Emergency Medicine

## 2010-07-13 DIAGNOSIS — Z7901 Long term (current) use of anticoagulants: Secondary | ICD-10-CM

## 2010-07-13 DIAGNOSIS — I4891 Unspecified atrial fibrillation: Secondary | ICD-10-CM

## 2010-07-13 LAB — POCT INR: INR: 2.9

## 2010-07-15 NOTE — Procedures (Signed)
[place]  Patient:    MAYMIE, BRUNKE Visit Number: [id number] MRN: [id number]          Service Type: END Location: ENDO Attending Physician:  Charna Elizabeth Dictated by:   Anselmo Rod, M.D. Proc. Date: 11/15/00 Admit Date:  11/15/2000   CC:         Reuben Likes, M.D.   Procedure Report  DATE OF BIRTH:  [birth year]  PROCEDURE PERFORMED:  Colonoscopy with snare polypectomy x 1 and biopsy x 1.  ENDOSCOPIST:  Anselmo Rod, M.D.  INSTRUMENT USED:  Olympus video colonoscope.  INDICATION FOR PROCEDURE:  A 59 year old white female undergoing colonoscopy for guaiac-positive stools.  Rule out colonic polyps, masses, hemorrhoids, etc.  PREPROCEDURE PREPARATION:  Informed consent was procured from the patient. The patient was fasted for eight hours prior to the procedure and prepped with a bottle of magnesium citrate and a gallon of NuLytely the night prior to the procedure.  PREPROCEDURE PHYSICAL:  VITAL SIGNS:  The patient had stable vital signs.  NECK:  Supple.  CHEST:  Clear to auscultation.  CARDIAC:  S1, S2 regular.  ABDOMEN:  Soft with normal bowel sounds.  DESCRIPTION OF PROCEDURE:  The patient was placed in the left lateral decubitus position and sedated with 75 mg of Demerol and 10 mg of Versed intravenously.  Once the patient was adequately sedated and maintained on low flow oxygen and continuous cardiac monitoring, the Olympus video colonoscope was advanced from the rectum to the cecum without difficulty.  The two small polyps were seen in the rectum; one was snared and one was biopsied.  No other masses were seen.  The appendix, orifice, and the ileocecal valve were clearly visualized and photographed.  IMPRESSION:  Healthy-appearing colon except for two small polyps removed from the rectum.  RECOMMENDATIONS: 1. Await pathology results. 2. Repeat guaiac studies on an outpatient basis. 3. Follow up in  the office in the next four weeks. Dictated by:   Anselmo Rod, M.D. Attending Physician:  Charna Elizabeth DD:  11/15/00 TD:  11/15/00 Job: [id number]

## 2010-07-20 ENCOUNTER — Ambulatory Visit (INDEPENDENT_AMBULATORY_CARE_PROVIDER_SITE_OTHER): Payer: Federal, State, Local not specified - PPO | Admitting: Emergency Medicine

## 2010-07-20 DIAGNOSIS — I4891 Unspecified atrial fibrillation: Secondary | ICD-10-CM

## 2010-07-20 DIAGNOSIS — Z7901 Long term (current) use of anticoagulants: Secondary | ICD-10-CM

## 2010-07-26 ENCOUNTER — Other Ambulatory Visit: Payer: Self-pay | Admitting: Emergency Medicine

## 2010-07-26 MED ORDER — METOPROLOL SUCCINATE ER 50 MG PO TB24: 50.0000 mg | ORAL_TABLET | Freq: Every day | ORAL | Status: DC

## 2010-07-27 ENCOUNTER — Encounter: Payer: Self-pay | Admitting: Cardiovascular Disease

## 2010-08-03 ENCOUNTER — Ambulatory Visit (INDEPENDENT_AMBULATORY_CARE_PROVIDER_SITE_OTHER): Payer: Federal, State, Local not specified - PPO | Admitting: Emergency Medicine

## 2010-08-03 DIAGNOSIS — Z7901 Long term (current) use of anticoagulants: Secondary | ICD-10-CM

## 2010-08-03 DIAGNOSIS — I4891 Unspecified atrial fibrillation: Secondary | ICD-10-CM

## 2010-08-03 LAB — POCT INR: INR: 3

## 2010-08-17 ENCOUNTER — Ambulatory Visit (INDEPENDENT_AMBULATORY_CARE_PROVIDER_SITE_OTHER): Payer: Federal, State, Local not specified - PPO | Admitting: Emergency Medicine

## 2010-08-17 DIAGNOSIS — I4891 Unspecified atrial fibrillation: Secondary | ICD-10-CM

## 2010-08-17 DIAGNOSIS — Z7901 Long term (current) use of anticoagulants: Secondary | ICD-10-CM

## 2010-08-17 LAB — POCT INR: INR: 2

## 2010-08-30 ENCOUNTER — Emergency Department (HOSPITAL_COMMUNITY): Payer: Federal, State, Local not specified - PPO

## 2010-08-30 ENCOUNTER — Observation Stay (HOSPITAL_COMMUNITY)
Admission: EM | Admit: 2010-08-30 | Discharge: 2010-08-31 | Disposition: A | Discharge status: Home / Self Care | Payer: Federal, State, Local not specified - PPO | Attending: Cardiology | Admitting: Cardiology

## 2010-08-30 DIAGNOSIS — M069 Rheumatoid arthritis, unspecified: Secondary | ICD-10-CM | POA: Insufficient documentation

## 2010-08-30 DIAGNOSIS — I10 Essential (primary) hypertension: Secondary | ICD-10-CM | POA: Insufficient documentation

## 2010-08-30 DIAGNOSIS — I5032 Chronic diastolic (congestive) heart failure: Secondary | ICD-10-CM | POA: Insufficient documentation

## 2010-08-30 DIAGNOSIS — R079 Chest pain, unspecified: Secondary | ICD-10-CM | POA: Insufficient documentation

## 2010-08-30 DIAGNOSIS — E876 Hypokalemia: Secondary | ICD-10-CM | POA: Insufficient documentation

## 2010-08-30 DIAGNOSIS — I4891 Unspecified atrial fibrillation: Secondary | ICD-10-CM

## 2010-08-30 DIAGNOSIS — E785 Hyperlipidemia, unspecified: Secondary | ICD-10-CM | POA: Insufficient documentation

## 2010-08-30 DIAGNOSIS — I2510 Atherosclerotic heart disease of native coronary artery without angina pectoris: Secondary | ICD-10-CM | POA: Insufficient documentation

## 2010-08-30 LAB — POCT I-STAT, CHEM 8
BUN: 26 mg/dL — ABNORMAL HIGH (ref 6–23)
Chloride: 98 mEq/L (ref 96–112)
Creatinine, Ser: 0.9 mg/dL (ref 0.50–1.10)
Glucose, Bld: 120 mg/dL — ABNORMAL HIGH (ref 70–99)
HCT: 42 % (ref 36.0–46.0)
Potassium: 2.5 mEq/L — CL (ref 3.5–5.1)

## 2010-08-30 LAB — CBC
MCH: 27.9 pg (ref 26.0–34.0)
MCV: 84.3 fL (ref 78.0–100.0)
Platelets: 190 10*3/uL (ref 150–400)
RDW: 16.4 % — ABNORMAL HIGH (ref 11.5–15.5)
WBC: 6 10*3/uL (ref 4.0–10.5)

## 2010-08-30 LAB — TSH: TSH: 1.96 u[IU]/mL (ref 0.350–4.500)

## 2010-08-30 LAB — GLUCOSE, CAPILLARY
Glucose-Capillary: 100 mg/dL — ABNORMAL HIGH (ref 70–99)
Glucose-Capillary: 113 mg/dL — ABNORMAL HIGH (ref 70–99)

## 2010-08-30 LAB — TROPONIN I: Troponin I: <0.3 ng/mL (ref <0.30)

## 2010-08-30 LAB — PROTIME-INR: INR: 1.5 — ABNORMAL HIGH (ref 0.00–1.49)

## 2010-08-30 LAB — BASIC METABOLIC PANEL
Calcium: 10.4 mg/dL (ref 8.4–10.5)
Creatinine, Ser: 0.82 mg/dL (ref 0.50–1.10)
GFR calc Af Amer: >60 mL/min (ref >60)

## 2010-08-30 LAB — CK TOTAL AND CKMB (NOT AT ARMC)
CK, MB: 3.7 ng/mL (ref 0.3–4.0)
Total CK: 81 U/L (ref 7–177)

## 2010-08-30 LAB — DIFFERENTIAL
Eosinophils Absolute: 0.6 10*3/uL (ref 0.0–0.7)
Eosinophils Relative: 10 % — ABNORMAL HIGH (ref 0–5)
Lymphs Abs: 1.8 10*3/uL (ref 0.7–4.0)
Monocytes Relative: 7 % (ref 3–12)

## 2010-08-31 LAB — BASIC METABOLIC PANEL
CO2: 24 mEq/L (ref 19–32)
Chloride: 98 mEq/L (ref 96–112)
Creatinine, Ser: 0.64 mg/dL (ref 0.50–1.10)
GFR calc Af Amer: >60 mL/min (ref >60)
Sodium: 134 mEq/L — ABNORMAL LOW (ref 135–145)

## 2010-08-31 LAB — PROTIME-INR: Prothrombin Time: 16.7 seconds — ABNORMAL HIGH (ref 11.6–15.2)

## 2010-08-31 LAB — HEPARIN LEVEL (UNFRACTIONATED)
Heparin Unfractionated: 0.22 IU/mL — ABNORMAL LOW (ref 0.30–0.70)
Heparin Unfractionated: <0.1 IU/mL — ABNORMAL LOW (ref 0.30–0.70)

## 2010-08-31 LAB — CARDIAC PANEL(CRET KIN+CKTOT+MB+TROPI)
Relative Index: INVALID (ref 0.0–2.5)
Troponin I: <0.3 ng/mL (ref <0.30)

## 2010-08-31 LAB — CBC
MCV: 84.3 fL (ref 78.0–100.0)
Platelets: 214 10*3/uL (ref 150–400)
RBC: 4.71 MIL/uL (ref 3.87–5.11)
RDW: 16.5 % — ABNORMAL HIGH (ref 11.5–15.5)

## 2010-08-31 LAB — GLUCOSE, CAPILLARY: Glucose-Capillary: 123 mg/dL — ABNORMAL HIGH (ref 70–99)

## 2010-09-01 ENCOUNTER — Other Ambulatory Visit: Payer: Self-pay | Admitting: Cardiovascular Disease

## 2010-09-01 DIAGNOSIS — E876 Hypokalemia: Secondary | ICD-10-CM

## 2010-09-07 ENCOUNTER — Ambulatory Visit (INDEPENDENT_AMBULATORY_CARE_PROVIDER_SITE_OTHER): Payer: Federal, State, Local not specified - PPO | Admitting: Emergency Medicine

## 2010-09-07 ENCOUNTER — Ambulatory Visit: Payer: Federal, State, Local not specified - PPO | Admitting: Cardiology

## 2010-09-07 ENCOUNTER — Other Ambulatory Visit (INDEPENDENT_AMBULATORY_CARE_PROVIDER_SITE_OTHER): Payer: Federal, State, Local not specified - PPO | Admitting: *Deleted

## 2010-09-07 DIAGNOSIS — I4891 Unspecified atrial fibrillation: Secondary | ICD-10-CM

## 2010-09-07 DIAGNOSIS — Z7901 Long term (current) use of anticoagulants: Secondary | ICD-10-CM

## 2010-09-07 DIAGNOSIS — E876 Hypokalemia: Secondary | ICD-10-CM

## 2010-09-08 LAB — BASIC METABOLIC PANEL
Chloride: 101 mEq/L (ref 96–112)
Glucose, Bld: 102 mg/dL — ABNORMAL HIGH (ref 70–99)
Potassium: 4.2 mEq/L (ref 3.5–5.3)
Sodium: 139 mEq/L (ref 135–145)

## 2010-09-13 ENCOUNTER — Ambulatory Visit (INDEPENDENT_AMBULATORY_CARE_PROVIDER_SITE_OTHER): Payer: Federal, State, Local not specified - PPO | Admitting: Cardiovascular Disease

## 2010-09-13 ENCOUNTER — Encounter: Payer: Self-pay | Admitting: Cardiovascular Disease

## 2010-09-13 DIAGNOSIS — I2510 Atherosclerotic heart disease of native coronary artery without angina pectoris: Secondary | ICD-10-CM

## 2010-09-13 DIAGNOSIS — E785 Hyperlipidemia, unspecified: Secondary | ICD-10-CM

## 2010-09-13 DIAGNOSIS — I519 Heart disease, unspecified: Secondary | ICD-10-CM

## 2010-09-13 DIAGNOSIS — E669 Obesity, unspecified: Secondary | ICD-10-CM

## 2010-09-13 DIAGNOSIS — I4891 Unspecified atrial fibrillation: Secondary | ICD-10-CM

## 2010-09-13 DIAGNOSIS — I10 Essential (primary) hypertension: Secondary | ICD-10-CM

## 2010-09-13 DIAGNOSIS — I5189 Other ill-defined heart diseases: Secondary | ICD-10-CM

## 2010-09-13 NOTE — Assessment & Plan Note (Signed)
No significant fluid overload at this time. No significant edema. Lungs are clear. Would continue torsemide daily with potassium.

## 2010-09-13 NOTE — Assessment & Plan Note (Signed)
Blood pressure is well controlled on today's visit. No changes made to the medications. 

## 2010-09-13 NOTE — Progress Notes (Signed)
Patient ID: Tina Pacheco, female    DOB: 16-Jul-1950, 60 y.o.   MRN: [id number]  HPI Comments: Tina Pacheco is a 60 year old woman who was  admitted to  [facility] on May 23 2010  for paroxysmal atrial fibrillation, echocardiogram showing normal LV unction with diastolic dysfunction, hyperlipidemia, obesity, hypertension, cardiac catheterization March 27 showing mild nonobstructive coronary artery disease also a history of asthma/COPD, suspected sleep apnea, rheumatoid arthritis who presents for routine followup.   She reports that she has been well though does have problem with her knees. She recently fell. Her gait is poor. She denies any tachycardia palpitations. She did not have any tachycardia palpitations previously when she had a 2 fibrillation but did feel though did feel "poor". She has chronic bilateral shoulder pain.   old EKG shows normal sinus rhythm with rate of 59 beats per minute with no significant ST or T wave changes   , Outpatient Encounter Prescriptions as of 09/13/2010  Medication Sig Dispense Refill  . ASTEPRO 0.15 % SOLN Place 1 spray into the nose as needed.      . chlorpheniramine-HYDROcodone (TUSSIONEX PENNKINETIC ER) 10-8 MG/5ML LQCR Take one teaspoon every twelve hours as needed.       . ergocalciferol (VITAMIN D2) 50000 UNITS capsule Take 50,000 Units by mouth once a week.        . esomeprazole (NEXIUM) 40 MG capsule 40 mg. Take one tablet as needed.       . Fluticasone-Salmeterol (ADVAIR DISKUS) 500-50 MCG/DOSE AEPB Inhale 1 puff into the lungs. One puff daily as needed.       . folic acid (FOLVITE) 1 MG tablet Take 1 mg by mouth daily.        . hydrocodone-acetaminophen (LORCET-HD) 5-500 MG per capsule Take 1 capsule by mouth every 6 (six) hours as needed.        . leflunomide (ARAVA) 20 MG tablet Take 20 mg by mouth daily.        . meloxicam (MOBIC) 7.5 MG tablet Take 7.5 mg by mouth daily.        . methotrexate (RHEUMATREX) 2.5 MG tablet Take 2.5 mg by  mouth once a week. Caution:Chemotherapy. Protect from light. Take six tablets weekly.       . metoprolol (TOPROL-XL) 50 MG 24 hr tablet Take 1 tablet (50 mg total) by mouth daily.  30 tablet  6  . montelukast (SINGULAIR) 10 MG tablet Take 10 mg by mouth at bedtime.        . Multiple Vitamin (MULTIVITAMIN) tablet Take 1 tablet by mouth daily.        Marland Kitchen nystatin (NYSTOP) 100000 UNIT/GM POWD Apply 1 application topically as needed.      . Omega-3 Fatty Acids (FISH OIL) 1000 MG CAPS 1 dose over 46 hours.        Marland Kitchen oxybutynin (DITROPAN-XL) 10 MG 24 hr tablet Take 10 mg by mouth daily.        . potassium chloride SA (K-DUR,KLOR-CON) 20 MEQ tablet 20 mEq. Take one tablet daily with an extra dose if taking metolazone.       . pregabalin (LYRICA) 75 MG capsule Take 75 mg by mouth 3 (three) times daily.        Marland Kitchen PROAIR HFA 108 (90 BASE) MCG/ACT inhaler Inhale 1-2 puffs into the lungs as needed.      . rosuvastatin (CRESTOR) 10 MG tablet Take 10 mg by mouth daily.        Marland Kitchen  SYMBICORT 160-4.5 MCG/ACT inhaler Inhale 2 puffs into the lungs Once daily as needed.      . terazosin (HYTRIN) 10 MG capsule Take 10 mg by mouth at bedtime.        . torsemide (DEMADEX) 20 MG tablet Take 20 mg by mouth daily.        Marland Kitchen warfarin (COUMADIN) 5 MG tablet Take 1 tablet (5 mg total) by mouth daily. May increase to 2 a day  180 tablet  3  . zolpidem (AMBIEN) 10 MG tablet Take 10 mg by mouth at bedtime as needed.           Review of Systems  Constitutional: Negative.   Eyes: Negative.   Respiratory: Positive for shortness of breath.   Gastrointestinal: Negative.   Musculoskeletal: Positive for joint swelling, arthralgias and gait problem.  Skin: Negative.   Neurological: Negative.   Hematological: Negative.   Psychiatric/Behavioral: Negative.   All other systems reviewed and are negative.   BP 159/79  Pulse 66  Ht 5\' 4"  (1.626 m)  Wt 317 lb (143.79 kg)  BMI 54.41 kg/m2   Physical Exam  Nursing note and vitals  reviewed. Constitutional: She is oriented to person, place, and time. She appears well-developed and well-nourished.       Obesity.  HENT:  Head: Normocephalic.  Nose: Nose normal.  Mouth/Throat: Oropharynx is clear and moist.  Eyes: Conjunctivae are normal. Pupils are equal, round, and reactive to light.  Neck: Normal range of motion. Neck supple. No JVD present.  Cardiovascular: Normal rate, regular rhythm, S1 normal, S2 normal, normal heart sounds and intact distal pulses.  Exam reveals no gallop and no friction rub.   No murmur heard. Pulmonary/Chest: Effort normal and breath sounds normal. No respiratory distress. She has no wheezes. She has no rales. She exhibits no tenderness.  Abdominal: Soft. Bowel sounds are normal. She exhibits no distension. There is no tenderness.  Musculoskeletal: Normal range of motion. She exhibits no tenderness.  Lymphadenopathy:    She has no cervical adenopathy.  Neurological: She is alert and oriented to person, place, and time. Coordination normal.  Skin: Skin is warm and dry. No rash noted. No erythema.  Psychiatric: She has a normal mood and affect. Her behavior is normal. Judgment and thought content normal.         Assessment and Plan

## 2010-09-13 NOTE — Patient Instructions (Signed)
You are doing well. No medication changes were made. Please call us if you have new issues that need to be addressed before your next appt.  We will call you for a follow up Appt. In 6 months  

## 2010-09-13 NOTE — Assessment & Plan Note (Signed)
Previous cardiac catheterization showing no significant coronary artery disease.

## 2010-09-13 NOTE — Assessment & Plan Note (Signed)
She appears to have maintained normal sinus rhythm. No further medication changes made.

## 2010-09-13 NOTE — Assessment & Plan Note (Signed)
Her obesity is one of her biggest issues. She has difficulty exercising secondary to knee pain.

## 2010-09-15 NOTE — Discharge Summary (Signed)
NAMEMarland Kitchen  Tina Pacheco, Tina Pacheco NO.:  [id number]  MEDICAL RECORD NO.:  [id number]  LOCATION:  3742                         FACILITY:  [facility]  PHYSICIAN:  Veverly Fells. Excell Seltzer, MD  DATE OF BIRTH:  1950-06-28  DATE OF ADMISSION:  08/30/2010 DATE OF DISCHARGE:  08/31/2010                              DISCHARGE SUMMARY   PRIMARY CARDIOLOGIST:  Antonieta Iba, MD of [facility] in [place].  PRIMARY CARE PROVIDER:  Dr. Radford Pax.  DISCHARGE DIAGNOSES: 1. Paroxysmal atrial fibrillation with rapid ventricular response,     spontaneously converting to normal sinus rhythm. 2. Hypokalemia, resolved. 3. Troponinemia secondary to paroxysmal atrial fibrillation with rapid     ventricular response. 4. Morbid obesity, body mass index greater than 40. 5. Nonobstructive coronary artery disease per cardiac catheterization,     May 25, 2010.  SECONDARY DIAGNOSES: 1. Hypertension. 2. Hyperlipidemia. 3. Rheumatoid arthritis. 4. Chronic diastolic heart failure, ejection fraction 60%.  PROCEDURES/DIAGNOSTICS PERFORMED DURING HOSPITALIZATION:  Chest x-ray on August 30, 2010:  Central peribronchial thickening with no peripheral infiltrates or consolidation.  REASON FOR HOSPITALIZATION:  This is a 60 year old female with the above- stated problem list who states she was in her usual state of health until the morning of admission where she woke to chest pain that radiated to both shoulders.  She has had prior symptoms and a recent hospitalization.  She denied palpitations or associated shortness of breath.  EMS was called and the patient was found to be in atrial fibrillation with rapid ventricular response at 150 beats per minute. The patient was placed on oxygen, brought to [facility] but en route spontaneously converted to sinus rhythm.  The patient's chest pain improved with conversion to sinus rhythm.  Labs in the emergency department showed potassium of 2.4.  Cardiac markers negative x1.   EKG was in sinus rhythm with no acute ischemic changes.  It was felt that the patient's atrial fibrillation could be related to her hypokalemia secondary to her diuretic use.  At that time, the patient was admitted for overnight observation and potassium supplementation.  HOSPITAL COURSE:  The patient was admitted to the telemetry unit and remained in sinus rhythm.  Her potassium was supplemented orally with a repeat potassium of 3.8.  Again, her atrial fibrillation is likely result of her hypokalemia.  Therefore, her metolazone has been stopped for routine use and is only to be used for severe swelling.  On the days, the patient will also take a an additional potassium supplement. The patient's daily potassium supplementation has also been increased twice daily.  The patient voiced understanding.  The patient's cardiac markers were cycled secondary to her complaints of chest pain.  She had a mild troponin leak of 0.39 without acute changes on EKG.  This was felt secondary to the patient's paroxysmal atrial fibrillation with repeat troponin this morning within normal limits.  Of note, the patient is also subtherapeutic on her Coumadin with an INR of 1.3.  Therefore, the patient's Coumadin dose has been increased to 7.5 mg 3 times a week and 2 times weekly was continued 1 tablet the remainder of the days.  At discharge, Dr. Excell Seltzer evaluated the patient and  noted her stable for home.  She will have a BMET and repeat INR in 1 week.  DISCHARGE LABORATORY DATA:  Cardiac enzymes negative x1.  WBC of 8.9, hemoglobin 13.3, hematocrit 39.7, and platelet 214.  INR 1.33.  Sodium 134, potassium 3.8, BUN 22, and creatinine 0.64.  DISCHARGE MEDICATIONS: 1. Metolazone 2.5 mg 1 tablet daily as needed for severe swelling,     take an extra dose of potassium if metolazone is used. 2. Potassium chloride 20 mEq 1 tablet twice daily. 3. Coumadin 5 mg, take 1-1/2 tablets Monday, Wednesday, and Friday      with all other days 1 tablet. 4. Folic acid 1 mg daily. 5. Hydrocodone/APAP 5/500 mg 1 tablet every 6 hours as needed for     pain. 6. Leflunomide 20 mg 1 tablet daily. 7. Levaquin 500 mg 1 tablet every morning until completed, filled July     26. 8. Lyrica 75 mg 1 capsule 3 times a day. 9. Mag-Ox 400 mg 1 tablet every morning. 10.Meloxicam 7.5 mg 1 tablet daily. 11.Methotrexate 2.5 mg 6 tablets weekly on Saturdays. 12.Metoprolol succinate 50 mg 1 tablet daily. 13.Multivitamin daily. 14.Nexium 40 mg 1 capsule daily as needed for reflux. 15.Omega-3 acid ethyl esters 1 g every other day. 16.Oxybutynin XL 10 mg 1 tablet daily. 17.Singulair 10 mg 1 tablet daily. 18.Terazosin 10 mg 1 tablet daily. 19.Torsemide 20 mg 1 tablet daily. 20.Tussionex extended release 1 teaspoon every 12 hours as needed for     cough. 21.Vitamin D2, 50,000 units 1 capsule weekly on Saturday. 22.Zolpidem 10 mg 1 tablet daily at bedtime as needed for sleep.  FOLLOWUP PLANS AND INSTRUCTIONS: 1. The patient will follow up in the [facility] in [place] on     July 11 at 8:45 a.m., at this time, a BMET will be obtained. 2. The patient will follow up with Dr. Mariah Milling in 2 weeks, the office     will call and schedule this appointment. 3. The patient should increase activity slowly. 4. The patient should continue low-sodium, heart-healthy diet. 5. The patient should stop any activity that causes chest pain or     shortness of breath. 6. The patient is to call the office in the interim for any problems     or concerns. 7. The patient is to record daily weights and call the office if there     is an increase in 3-4 pounds overnight.  DURATION OF DISCHARGE:  Greater than 80 minutes with physician and physician assistant time.     Leonette Monarch, PA-C   ______________________________ Veverly Fells. Excell Seltzer, MD    NB/MEDQ  D:  08/31/2010  T:  08/31/2010  Job:  [id number]  cc:   Antonieta Iba, MD Dr.  Radford Pax  Electronically Signed by Alen Blew P.A. on 09/01/2010 07:23:47 PM Electronically Signed by Tonny Bollman MD on 09/15/2010 12:47:00 AM

## 2010-09-21 ENCOUNTER — Ambulatory Visit (INDEPENDENT_AMBULATORY_CARE_PROVIDER_SITE_OTHER): Payer: Federal, State, Local not specified - PPO | Admitting: Emergency Medicine

## 2010-09-21 DIAGNOSIS — Z7901 Long term (current) use of anticoagulants: Secondary | ICD-10-CM

## 2010-09-21 DIAGNOSIS — I4891 Unspecified atrial fibrillation: Secondary | ICD-10-CM

## 2010-09-21 LAB — POCT INR: INR: 2.5

## 2010-09-23 NOTE — H&P (Signed)
NAME:  DAVIE, SAGONA NO.:  [id number]  MEDICAL RECORD NO.:  [id number]  LOCATION:  3742                         FACILITY:  [facility]  PHYSICIAN:  Rollene Rotunda, MD, FACCDATE OF BIRTH:  [birth year]  DATE OF ADMISSION:  08/30/2010 DATE OF DISCHARGE:                             HISTORY & PHYSICAL   PRIMARY CARE PHYSICIAN:  Reuben Likes, MD.  PRIMARY CARDIOLOGIST:  Antonieta Iba, MD.  CHIEF COMPLAINT:  Chest pain/PAF.  HISTORY OF PRESENT ILLNESS:  Ms. Rochefort is a 60 year old female with a history of nonobstructive coronary artery disease by recent cath.  She was in her usual state of health this a.m. and was wakened by chest pain.  The pain was in her chest and radiated to both shoulders.  This is similar to the symptoms for which she came to the hospital in March 2012.  She got up and had orthostatic presyncope.  She describes the pain is dull and throbbing.  It also radiated to the back of her neck. There were no specific aggravating factors.  She had no palpitations and no awareness of her rapid heart rate or that her heart was out of rhythm.  EMS was called and they describe rapid AFib, rate about 150s on the first assessment.  The patient's symptoms improved after oxygen was given.  The pain at its worst was 6 or 7/10.  After she received oxygen by EMS, her symptoms improved.  She spontaneously converted to sinus rhythm prior to arrival in the emergency room.  Per verbal report from the EMS, the pain in her chest as well as the pain in her neck and shoulders gradually improved after she went back into sinus rhythm.  She has maintained sinus rhythm since being in the emergency room.  She was given 1 dose of metoprolol tartrate 50 mg p.o. as well as potassium 20 mEq.  This is the first episode of chest pain or shoulder pain she has had since being in the hospital in March.  She has never had palpitations.  PAST MEDICAL HISTORY: 1. Status post  admission on May 23, 2010, through May 25, 2010,     for chest pain and PAF. 2. Status post echocardiogram on May 24, 2010, showing an EF of 60-     65% with grade 2 diastolic dysfunction and a left ventricular end-     diastolic pressure at least 20 mmHg, aortic valve sclerosis without     stenosis and a moderately calcified mitral valve annulus with     trivial regurgitation, PAS 34. 3. PAF, diagnosed in March 2012. 4. Anticoagulation with Coumadin started in March 2012. 5. Rheumatoid arthritis as well as osteoarthritis. 6. Hypertension. 7. Hyperlipidemia. 8. Super morbid obesity with a body mass index greater than 50. 9. Asthma/COPD. 10.History of basal cell carcinoma. 11.History of hyperplastic colon polyps. 12.Status post cardiac catheterization on May 25, 2010, showing LAD     30% as well as 40%, diagonals patent with circumflex and RCA having     no significant disease, EF 60%. 13.Elevated TSH with normal free T4. 14.Chronic diastolic CHF. 15.Hypokalemia on presentation to the hospital in March 2012 with a  potassium of 2.4.  PAST SURGICAL HISTORY:  She is status post bilateral knee arthroscopy, cholecystectomy, hysterectomy, and C-section as well as colonoscopy with snare polypectomy.  ALLERGIES:  CODEINE.  CURRENT MEDICATIONS:  (Per the [facility] note on August 17, 2010 are) 1. Coumadin 1 tablet daily except for 1-1/2 tablets on Mondays and     Fridays. 2. Tussionex p.r.n. 3. Vitamin D2 50,000 units once a week. 4. Nexium 40 mg p.r.n. 5. Advair 50/500 p.r.n. 6. Folic acid 1 mg daily. 7. Vicodin 5/500 p.r.n. 8. Arava 20 mg a day. 9. Mobic 7.5 mg daily. 10.Methotrexate 2.5 mg 6 tablets once a week. 11.Zaroxolyn 2.5 mg daily p.r.n., takes every other day. 12.Toprol-XL 50 mg a day. 13.Singulair 10 mg a day. 14.Multivitamin daily. 15.Omega 3 daily. 16.Oxybutynin 10 mg daily. 17.Potassium 20 mEq daily. 18.Lyrica 75 t.i.d. 19.Hytrin 10 mg  nightly. 20.Demadex 20 mg daily. 21.Ambien 10 mg nightly p.r.n.  SOCIAL HISTORY:  She lives in [place] with her husband and daughter. She is retired from the [employer].  She denies alcohol, tobacco, or drug abuse.  FAMILY HISTORY:  Mother died at 56 with Parkinson's and her father died at 62 with an MI.  She has 2 sisters and neither one has any cardiac issues.  REVIEW OF SYSTEMS:  She had some sweats.  She had some presyncope.  She has some problems with incontinence.  She has weakness, especially in her left leg.  She has chronic arthralgias and joint pains.  She denies recent GI symptoms and has had no melena.  Full 14-point review of systems is, otherwise, negative except as stated in the HPI.  PHYSICAL EXAMINATION:  VITAL SIGNS:  Temperature is 97.6, blood pressure 122/66, pulse 86, respiratory rate 18, O2 saturation 98% on room air. GENERAL:  She is a well-developed, well-nourished white female in no acute distress. HEENT:  Normal. NECK:  There is no lymphadenopathy, thyromegaly, bruit, or JVD noted. CV:  Heart is regular rate and rhythm with an S1 and S2 and no significant murmur, rub, or gallop is noted.  Distal pulses are intact in all four extremities. LUNGS:  She has some rales in the bases, but no wheezes or rhonchi are noted and her respirations are nonlabored without crackles. SKIN:  She has a few small areas of ecchymosis. ABDOMEN:  Soft and nontender with active bowel sounds. EXTREMITIES:  She has no significant edema and no cyanosis or clubbing is noted. MUSCULOSKELETAL:  There is no joint deformity or effusions and no spine or CVA tenderness. NEURO:  She is alert and oriented.  Cranial nerves II through XII grossly intact.  Chest x-ray shows central peribronchial thickening, but no peripheral infiltrate or consolidation is noted.  EKG is sinus rhythm with possible left atrial enlargement and possible intraventricular conduction defect, but no acute ischemic  changes.  LABORATORY VALUES:  Hemoglobin 13.5, hematocrit 40.8, WBC 6.0, platelets 190.  INR 1.5.  Sodium 135, potassium 2.4, chloride 94, CO2 28, BUN 24, creatinine 0.82, glucose 113.  CK-MB and troponin I negative x1.  IMPRESSION:  Ms. Parrales was seen today by Dr. Antoine Poche, the patient evaluated and the data reviewed. 1. She has paroxysmal atrial fibrillation:  She feels chest pain.  She     denies dyspnea, PND, or orthopnea.  Of note, again her AFib is     associated with low potassium. 2. Atrial fibrillation:  We wonder if this could be related to     hypokalemia secondary to her diuretic use.  At this point,     supplementing the potassium rather than starting an antiarrhythmic     is preferable.  If she continues to have problems with paroxysmal     atrial fibrillation after her potassium is maintained within normal     limits, then consideration can be given to an antiarrhythmic.     Theodore Demark, PA-C   ______________________________ Rollene Rotunda, MD, [facility]    RB/MEDQ  D:  08/30/2010  T:  08/31/2010  Job:  [id number]  Electronically Signed by Theodore Demark PA-C on 09/13/2010 05:05:42 PM Electronically Signed by Rollene Rotunda MD [facility] on 09/23/2010 07:52:48 PM

## 2010-10-12 ENCOUNTER — Ambulatory Visit (INDEPENDENT_AMBULATORY_CARE_PROVIDER_SITE_OTHER): Payer: Federal, State, Local not specified - PPO | Admitting: Emergency Medicine

## 2010-10-12 DIAGNOSIS — I4891 Unspecified atrial fibrillation: Secondary | ICD-10-CM

## 2010-10-12 DIAGNOSIS — Z7901 Long term (current) use of anticoagulants: Secondary | ICD-10-CM

## 2010-10-12 LAB — POCT INR: INR: 2.5

## 2010-10-20 ENCOUNTER — Telehealth: Payer: Self-pay

## 2010-10-20 NOTE — Telephone Encounter (Signed)
The patient has been in & out of A-Fib due to potassium dropping.  She is taking potassium 20 meq two tablets in the am.  She wants to know if she should be taking the potassium 20 meq twice a day. She tends to forget the pm dose is why she is taking the potassium two in the am instead of twice a day. She only has been taken the torsemide 20 mg one tablet daily.  She has not needed any of the zaroxolyn.  Also when do you think she should have her potassium checked again?

## 2010-10-21 NOTE — Telephone Encounter (Signed)
Notified patient need to only take the Potassium 20 meq one tablet daily.  The patient is coming for a BMET on Aug. 29, 2012.  She was told we will call her with lab results & she may need to see Dr. Mariah Milling sooner that next year.

## 2010-10-25 ENCOUNTER — Ambulatory Visit: Payer: Self-pay | Admitting: Emergency Medicine

## 2010-10-26 ENCOUNTER — Ambulatory Visit (INDEPENDENT_AMBULATORY_CARE_PROVIDER_SITE_OTHER): Payer: Federal, State, Local not specified - PPO | Admitting: *Deleted

## 2010-10-26 DIAGNOSIS — Z79899 Other long term (current) drug therapy: Secondary | ICD-10-CM

## 2010-10-26 DIAGNOSIS — I2510 Atherosclerotic heart disease of native coronary artery without angina pectoris: Secondary | ICD-10-CM

## 2010-10-26 LAB — BASIC METABOLIC PANEL
CO2: 25 mEq/L (ref 19–32)
Calcium: 10.4 mg/dL (ref 8.4–10.5)
Sodium: 139 mEq/L (ref 135–145)

## 2010-10-29 ENCOUNTER — Ambulatory Visit: Payer: Self-pay | Admitting: Emergency Medicine

## 2010-11-09 ENCOUNTER — Ambulatory Visit (INDEPENDENT_AMBULATORY_CARE_PROVIDER_SITE_OTHER): Payer: Federal, State, Local not specified - PPO | Admitting: Emergency Medicine

## 2010-11-09 DIAGNOSIS — Z7901 Long term (current) use of anticoagulants: Secondary | ICD-10-CM

## 2010-11-09 DIAGNOSIS — I4891 Unspecified atrial fibrillation: Secondary | ICD-10-CM

## 2010-11-09 LAB — POCT INR: INR: 2.5

## 2010-11-28 ENCOUNTER — Ambulatory Visit: Payer: Self-pay | Admitting: Emergency Medicine

## 2010-12-07 ENCOUNTER — Ambulatory Visit (INDEPENDENT_AMBULATORY_CARE_PROVIDER_SITE_OTHER): Payer: Federal, State, Local not specified - PPO | Admitting: Emergency Medicine

## 2010-12-07 DIAGNOSIS — I4891 Unspecified atrial fibrillation: Secondary | ICD-10-CM

## 2010-12-07 DIAGNOSIS — Z7901 Long term (current) use of anticoagulants: Secondary | ICD-10-CM

## 2010-12-07 LAB — POCT INR: INR: 2.6

## 2010-12-29 ENCOUNTER — Ambulatory Visit: Payer: Self-pay | Admitting: Emergency Medicine

## 2011-01-04 ENCOUNTER — Ambulatory Visit (INDEPENDENT_AMBULATORY_CARE_PROVIDER_SITE_OTHER): Payer: Federal, State, Local not specified - PPO | Admitting: Emergency Medicine

## 2011-01-04 DIAGNOSIS — I4891 Unspecified atrial fibrillation: Secondary | ICD-10-CM

## 2011-01-04 DIAGNOSIS — Z7901 Long term (current) use of anticoagulants: Secondary | ICD-10-CM

## 2011-01-25 ENCOUNTER — Encounter: Payer: Federal, State, Local not specified - PPO | Admitting: Emergency Medicine

## 2011-01-28 ENCOUNTER — Ambulatory Visit: Payer: Self-pay | Admitting: Emergency Medicine

## 2011-02-01 ENCOUNTER — Ambulatory Visit (INDEPENDENT_AMBULATORY_CARE_PROVIDER_SITE_OTHER): Payer: Federal, State, Local not specified - PPO | Admitting: Emergency Medicine

## 2011-02-01 DIAGNOSIS — I4891 Unspecified atrial fibrillation: Secondary | ICD-10-CM

## 2011-02-01 DIAGNOSIS — Z7901 Long term (current) use of anticoagulants: Secondary | ICD-10-CM

## 2011-03-01 ENCOUNTER — Ambulatory Visit (INDEPENDENT_AMBULATORY_CARE_PROVIDER_SITE_OTHER): Payer: Federal, State, Local not specified - PPO | Admitting: Emergency Medicine

## 2011-03-01 DIAGNOSIS — I4891 Unspecified atrial fibrillation: Secondary | ICD-10-CM

## 2011-03-01 DIAGNOSIS — Z7901 Long term (current) use of anticoagulants: Secondary | ICD-10-CM

## 2011-03-01 LAB — POCT INR: INR: 1.8

## 2011-03-03 ENCOUNTER — Encounter: Payer: Self-pay | Admitting: Cardiovascular Disease

## 2011-03-03 ENCOUNTER — Ambulatory Visit (INDEPENDENT_AMBULATORY_CARE_PROVIDER_SITE_OTHER): Payer: Federal, State, Local not specified - PPO | Admitting: Cardiovascular Disease

## 2011-03-03 VITALS — BP 142/90 | HR 64 | Ht 64.5 in | Wt 313.0 lb

## 2011-03-03 DIAGNOSIS — E785 Hyperlipidemia, unspecified: Secondary | ICD-10-CM

## 2011-03-03 DIAGNOSIS — I2510 Atherosclerotic heart disease of native coronary artery without angina pectoris: Secondary | ICD-10-CM

## 2011-03-03 DIAGNOSIS — I519 Heart disease, unspecified: Secondary | ICD-10-CM

## 2011-03-03 DIAGNOSIS — I5189 Other ill-defined heart diseases: Secondary | ICD-10-CM

## 2011-03-03 DIAGNOSIS — I10 Essential (primary) hypertension: Secondary | ICD-10-CM

## 2011-03-03 DIAGNOSIS — E669 Obesity, unspecified: Secondary | ICD-10-CM

## 2011-03-03 DIAGNOSIS — I4891 Unspecified atrial fibrillation: Secondary | ICD-10-CM

## 2011-03-03 DIAGNOSIS — R0789 Other chest pain: Secondary | ICD-10-CM

## 2011-03-03 NOTE — Assessment & Plan Note (Signed)
She is taking her diuretic infrequently. I suspect her recent shortness of breath is secondary to resolving bronchitis, possible mild bronchoconstriction or asthma. Unable to exclude mild diastolic dysfunction and fluid overload. No significant edema noted. I suggested she try extra torsemide as needed if symptoms of shortness of breath return. She does drink a significant amount of fluids during the daytime. If symptoms get worse, she could decrease her fluid intake.

## 2011-03-03 NOTE — Assessment & Plan Note (Signed)
Minimal coronary artery disease by cardiac catheterization

## 2011-03-03 NOTE — Assessment & Plan Note (Signed)
Cholesterol is at goal on the current lipid regimen. No changes to the medications were made.  

## 2011-03-03 NOTE — Assessment & Plan Note (Signed)
She appears to be maintaining normal sinus rhythm. No known episodes since July 2012. We will continue with her current medication regimen.

## 2011-03-03 NOTE — Progress Notes (Signed)
Patient ID: Tina Pacheco, female    DOB: 04-22-1950, 61 y.o.   MRN: [id number]  HPI Comments: Tina Pacheco is a 60 year old woman With obesity,  who was  admitted to  [facility] on May 23 2010  for paroxysmal atrial fibrillation, echocardiogram showing normal LV unction with diastolic dysfunction, hyperlipidemia, obesity, hypertension, cardiac catheterization March 27 showing mild nonobstructive coronary artery disease also a history of asthma/COPD, suspected sleep apnea, rheumatoid arthritis who presents for routine followup.   She reports that over the past month, she has had Trouble breathing. She is currently On amoxicillin for URI. She was not feeling well for many days though started feeling better yesterday. When she does not feel well, her BP Runs  high.  She continues to have problem with her knees. Her gait is poor. She denies any tachycardia palpitations. Her atrial fibrillation is asymptomatic though occasionally when she has these episodes, she will feel "poor", She has chronic bilateral shoulder pain.  Last episodes of atrial fibrillation were March and July 2012.  old EKG shows normal sinus rhythm with rate of 64 beats per minute with no significant ST or T wave changes    Outpatient Encounter Prescriptions as of 03/03/2011  Medication Sig Dispense Refill  . ASTEPRO 0.15 % SOLN Place 1 spray into the nose as needed.      . chlorpheniramine-HYDROcodone (TUSSIONEX PENNKINETIC ER) 10-8 MG/5ML LQCR Take one teaspoon every twelve hours as needed.       . colchicine 0.6 MG tablet Take 0.6 mg by mouth daily. As needed for gout.       . ergocalciferol (VITAMIN D2) 50000 UNITS capsule Take 50,000 Units by mouth once a week.        . esomeprazole (NEXIUM) 40 MG capsule 40 mg. Take one tablet as needed.       . Fluticasone-Salmeterol (ADVAIR DISKUS) 500-50 MCG/DOSE AEPB Inhale 1 puff into the lungs. One puff daily as needed.       . folic acid (FOLVITE) 1 MG tablet Take 1 mg by  mouth daily.        . hydrocodone-acetaminophen (LORCET-HD) 5-500 MG per capsule Take 1 capsule by mouth every 6 (six) hours as needed.        . leflunomide (ARAVA) 20 MG tablet Take 20 mg by mouth daily.        . meloxicam (MOBIC) 7.5 MG tablet Take 7.5 mg by mouth daily.        . methotrexate (RHEUMATREX) 2.5 MG tablet Take 2.5 mg by mouth once a week. Caution:Chemotherapy. Protect from light. Take six tablets weekly.       . metolazone (ZAROXOLYN) 2.5 MG tablet Take 2.5 mg by mouth daily as needed.        . metoprolol (TOPROL-XL) 50 MG 24 hr tablet Take 1 tablet (50 mg total) by mouth daily.  30 tablet  6  . montelukast (SINGULAIR) 10 MG tablet Take 10 mg by mouth at bedtime.        . Multiple Vitamin (MULTIVITAMIN) tablet Take 1 tablet by mouth daily.        Marland Kitchen nystatin (NYSTOP) 100000 UNIT/GM POWD Apply 1 application topically as needed.      . Omega-3 Fatty Acids (FISH OIL) 1000 MG CAPS 1 dose over 46 hours.        Marland Kitchen oxybutynin (DITROPAN-XL) 10 MG 24 hr tablet Take 10 mg by mouth daily.        . potassium chloride SA (  K-DUR,KLOR-CON) 20 MEQ tablet 20 mEq. Take one tablet daily with an extra dose if taking metolazone.       . pregabalin (LYRICA) 75 MG capsule Take 75 mg by mouth 3 (three) times daily.        Marland Kitchen PROAIR HFA 108 (90 BASE) MCG/ACT inhaler Inhale 1-2 puffs into the lungs as needed.      . rosuvastatin (CRESTOR) 10 MG tablet Take 10 mg by mouth daily.        . SYMBICORT 160-4.5 MCG/ACT inhaler Inhale 2 puffs into the lungs Once daily as needed.      . terazosin (HYTRIN) 10 MG capsule Take 10 mg by mouth at bedtime.        . torsemide (DEMADEX) 20 MG tablet Take 20 mg by mouth daily.        Marland Kitchen warfarin (COUMADIN) 5 MG tablet Take 1 tablet (5 mg total) by mouth daily. May increase to 2 a day  180 tablet  3  . zolpidem (AMBIEN) 10 MG tablet Take 10 mg by mouth at bedtime as needed.           Review of Systems  Constitutional: Negative.   HENT: Negative.   Eyes: Negative.     Respiratory: Positive for shortness of breath.   Cardiovascular: Positive for leg swelling.  Gastrointestinal: Negative.   Musculoskeletal: Positive for gait problem.  Skin: Negative.   Neurological: Negative.   Hematological: Negative.   Psychiatric/Behavioral: Negative.   All other systems reviewed and are negative.    BP 142/90  Pulse 64  Ht 5' 4.5" (1.638 m)  Wt 313 lb (141.976 kg)  BMI 52.90 kg/m2  Physical Exam  Nursing note and vitals reviewed. Constitutional: She is oriented to person, place, and time. She appears well-developed and well-nourished.       obese  HENT:  Head: Normocephalic.  Nose: Nose normal.  Mouth/Throat: Oropharynx is clear and moist.  Eyes: Conjunctivae are normal. Pupils are equal, round, and reactive to light.  Neck: Normal range of motion. Neck supple. No JVD present.  Cardiovascular: Normal rate, regular rhythm, S1 normal, S2 normal, normal heart sounds and intact distal pulses.  Exam reveals no gallop and no friction rub.   No murmur heard. Pulmonary/Chest: Effort normal and breath sounds normal. No respiratory distress. She has no wheezes. She has no rales. She exhibits no tenderness.  Abdominal: Soft. Bowel sounds are normal. She exhibits no distension. There is no tenderness.  Musculoskeletal: Normal range of motion. She exhibits no edema and no tenderness.  Lymphadenopathy:    She has no cervical adenopathy.  Neurological: She is alert and oriented to person, place, and time. Coordination normal.  Skin: Skin is warm and dry. No rash noted. No erythema.  Psychiatric: She has a normal mood and affect. Her behavior is normal. Judgment and thought content normal.         Assessment and Plan

## 2011-03-03 NOTE — Patient Instructions (Signed)
You are doing well. No medication changes were made.  Please call us if you have new issues that need to be addressed before your next appt.  Your physician wants you to follow-up in: 6 months.  You will receive a reminder letter in the mail two months in advance. If you don't receive a letter, please call our office to schedule the follow-up appointment.   

## 2011-03-03 NOTE — Assessment & Plan Note (Signed)
Blood pressure is well controlled on today's visit. No changes made to the medications. 

## 2011-03-03 NOTE — Assessment & Plan Note (Signed)
We have encouraged continued exercise, careful diet management in an effort to lose weight. 

## 2011-03-06 ENCOUNTER — Ambulatory Visit (INDEPENDENT_AMBULATORY_CARE_PROVIDER_SITE_OTHER): Payer: Federal, State, Local not specified - PPO | Admitting: Family Medicine

## 2011-03-06 ENCOUNTER — Encounter: Payer: Self-pay | Admitting: Family Medicine

## 2011-03-06 VITALS — BP 122/84 | HR 69 | Temp 97.9°F | Ht 64.5 in | Wt 314.2 lb

## 2011-03-06 DIAGNOSIS — E669 Obesity, unspecified: Secondary | ICD-10-CM

## 2011-03-06 DIAGNOSIS — I4891 Unspecified atrial fibrillation: Secondary | ICD-10-CM

## 2011-03-06 DIAGNOSIS — E785 Hyperlipidemia, unspecified: Secondary | ICD-10-CM

## 2011-03-06 DIAGNOSIS — I10 Essential (primary) hypertension: Secondary | ICD-10-CM

## 2011-03-06 DIAGNOSIS — M069 Rheumatoid arthritis, unspecified: Secondary | ICD-10-CM

## 2011-03-06 NOTE — Progress Notes (Signed)
Patient ID: Tina Pacheco, female    DOB: 02-Apr-1950, 61 y.o.   MRN: [id number]  HPI Comments: Tina Pacheco is a 61 year old woman already established with Dr. Mariah Milling here to establish care.  Previously followed by Dr. Lorenz Coaster who is retiring.  She has a h/oobesity,  who was  admitted to  [facility] on May 23 2010  for paroxysmal atrial fibrillation, echocardiogram showing normal LV unction with diastolic dysfunction, hyperlipidemia, obesity, hypertension, cardiac catheterization March 27 showing mild nonobstructive coronary artery disease also a history of asthma/COPD, suspected sleep apnea, rheumatoid arthritis who presents for routine followup.   She did have a URI last month with associated SOB, that has improved. Was trying to increase frequency of her diuretic to see if that would help with SOB, which it did not.   Feels it resolved on own.  Knee pain- per pt, multiple surgeries, including meniscal repair.  Has RA and OA. She is working on walking more.  A fib-asymptomatic.  Denies any palpitations, CP or fatigue. Last episodes of atrial fibrillation were March and July 2012.  RA- has been on MTX for at least 20 years. Mainly in feet and knees, pretty well controlled although has OA too and sometimes difficult to delineate symptoms.     Outpatient Encounter Prescriptions as of 03/06/2011  Medication Sig Dispense Refill  . chlorpheniramine-HYDROcodone (TUSSIONEX PENNKINETIC ER) 10-8 MG/5ML LQCR Take one teaspoon every twelve hours as needed.       . hydrocodone-acetaminophen (LORCET-HD) 5-500 MG per capsule Take 1 capsule by mouth every 6 (six) hours as needed.        . methotrexate (RHEUMATREX) 2.5 MG tablet Take 2.5 mg by mouth once a week. Caution:Chemotherapy. Protect from light. Take six tablets weekly.       . metoprolol (TOPROL-XL) 50 MG 24 hr tablet Take 1 tablet (50 mg total) by mouth daily.  30 tablet  6  . montelukast (SINGULAIR) 10 MG tablet Take 10 mg by mouth at  bedtime.        . Multiple Vitamin (MULTIVITAMIN) tablet Take 1 tablet by mouth daily.        . Omega-3 Fatty Acids (FISH OIL) 1000 MG CAPS 1 dose over 46 hours.        Marland Kitchen oxybutynin (DITROPAN-XL) 10 MG 24 hr tablet Take 10 mg by mouth daily.        . potassium chloride SA (K-DUR,KLOR-CON) 20 MEQ tablet 20 mEq. Take one tablet daily with an extra dose if taking metolazone.       . SYMBICORT 160-4.5 MCG/ACT inhaler Inhale 2 puffs into the lungs Once daily as needed.      . terazosin (HYTRIN) 10 MG capsule Take 10 mg by mouth at bedtime.        . torsemide (DEMADEX) 20 MG tablet Take 20 mg by mouth daily.        Marland Kitchen warfarin (COUMADIN) 5 MG tablet Take 1 tablet (5 mg total) by mouth daily. May increase to 2 a day  180 tablet  3  . zolpidem (AMBIEN) 10 MG tablet Take 10 mg by mouth at bedtime as needed.        Marland Kitchen DISCONTD: ASTEPRO 0.15 % SOLN Place 1 spray into the nose as needed.      Marland Kitchen DISCONTD: colchicine 0.6 MG tablet Take 0.6 mg by mouth daily. As needed for gout.       Marland Kitchen DISCONTD: ergocalciferol (VITAMIN D2) 50000 UNITS capsule Take 50,000 Units by  mouth once a week.        Marland Kitchen DISCONTD: esomeprazole (NEXIUM) 40 MG capsule 40 mg. Take one tablet as needed.       Marland Kitchen DISCONTD: Fluticasone-Salmeterol (ADVAIR DISKUS) 500-50 MCG/DOSE AEPB Inhale 1 puff into the lungs. One puff daily as needed.       Marland Kitchen DISCONTD: folic acid (FOLVITE) 1 MG tablet Take 1 mg by mouth daily.        Marland Kitchen DISCONTD: leflunomide (ARAVA) 20 MG tablet Take 20 mg by mouth daily.        Marland Kitchen DISCONTD: meloxicam (MOBIC) 7.5 MG tablet Take 7.5 mg by mouth daily.        Marland Kitchen DISCONTD: metolazone (ZAROXOLYN) 2.5 MG tablet Take 2.5 mg by mouth daily as needed.        Marland Kitchen DISCONTD: nystatin (NYSTOP) 100000 UNIT/GM POWD Apply 1 application topically as needed.      Marland Kitchen DISCONTD: pregabalin (LYRICA) 75 MG capsule Take 75 mg by mouth 3 (three) times daily.        Marland Kitchen DISCONTD: PROAIR HFA 108 (90 BASE) MCG/ACT inhaler Inhale 1-2 puffs into the lungs as  needed.      Marland Kitchen DISCONTD: rosuvastatin (CRESTOR) 10 MG tablet Take 10 mg by mouth daily.           Review of Systems  See HPI    BP 122/84  Pulse 69  Temp(Src) 97.9 F (36.6 C) (Oral)  Ht 5' 4.5" (1.638 m)  Wt 314 lb 4 oz (142.543 kg)  BMI 53.11 kg/m2  Physical Exam  Nursing note and vitals reviewed. Constitutional: She is oriented to person, place, and time. She appears well-developed and well-nourished.       obese  HENT:  Head: Normocephalic.  Nose: Nose normal.  Mouth/Throat: Oropharynx is clear and moist.  Eyes: Conjunctivae are normal. Pupils are equal, round, and reactive to light.  Neck: Normal range of motion. Neck supple. No JVD present.  Cardiovascular: Normal rate, regular rhythm, S1 normal, S2 normal, normal heart sounds and intact distal pulses.  Exam reveals no gallop and no friction rub.   No murmur heard. Pulmonary/Chest: Effort normal and breath sounds normal. No respiratory distress. She has no wheezes. She has no rales. She exhibits no tenderness.  Abdominal: Soft. Bowel sounds are normal. She exhibits no distension. There is no tenderness.  Musculoskeletal: Normal range of motion. She exhibits no edema and no tenderness.  Lymphadenopathy:    She has no cervical adenopathy.  Neurological: She is alert and oriented to person, place, and time. Coordination normal.  Skin: Skin is warm and dry. No rash noted. No erythema.  Psychiatric: She has a normal mood and affect. Her behavior is normal. Judgment and thought content normal.         Assessment and Plan   1. HTN (hypertension)  Stable.  2. Rheumatoid arthritis  Well controlled on MTX.  3. Hyperlipidemia  Stable.  4. Obesity  Deteriorated.  Discussed water therapy, increased walking.  5. Atrial fibrillation Rate and rhythm controlled.

## 2011-03-08 ENCOUNTER — Telehealth: Payer: Self-pay

## 2011-03-08 MED ORDER — METOPROLOL SUCCINATE ER 50 MG PO TB24: 50.0000 mg | ORAL_TABLET | Freq: Every day | ORAL | Status: DC

## 2011-03-08 NOTE — Telephone Encounter (Signed)
Refill sent for metoprolol succ 50 mg 

## 2011-03-10 ENCOUNTER — Telehealth: Payer: Self-pay

## 2011-03-10 MED ORDER — METOPROLOL SUCCINATE ER 50 MG PO TB24: 50.0000 mg | ORAL_TABLET | Freq: Every day | ORAL | Status: DC

## 2011-03-10 NOTE — Telephone Encounter (Signed)
Refill sent for metoprolol.  

## 2011-03-15 ENCOUNTER — Ambulatory Visit: Payer: Federal, State, Local not specified - PPO | Admitting: Cardiovascular Disease

## 2011-03-22 ENCOUNTER — Ambulatory Visit (INDEPENDENT_AMBULATORY_CARE_PROVIDER_SITE_OTHER): Payer: Federal, State, Local not specified - PPO | Admitting: Emergency Medicine

## 2011-03-22 DIAGNOSIS — Z7901 Long term (current) use of anticoagulants: Secondary | ICD-10-CM

## 2011-03-22 DIAGNOSIS — I4891 Unspecified atrial fibrillation: Secondary | ICD-10-CM

## 2011-03-28 ENCOUNTER — Telehealth: Payer: Self-pay | Admitting: Family Medicine

## 2011-03-28 NOTE — Telephone Encounter (Signed)
Triage Record Num: [id number] Operator: Revonda Humphrey Patient Name: Tina Pacheco Call Date & Time: 03/28/2011 11:25:18AM Patient Phone: [phone] PCP: Ruthe Mannan Patient Gender: Female PCP Fax : [phone] Patient DOB: [birth year] Practice Name: [facility] Reason for Call: Caller: Rhiannan/Patient; PCP: Ruthe Mannan Nestor Ramp); CB#: [phone]; ; ; Call regarding Headache and Nausea; Watching TV movie made with hand held cameras and became nauseous with headache 2 weeks ago. Sx got so back that had to go to bed in dark quiet place but nausea got worse. Pain in head on top of head and head felt full from ears up like heavy feeling in top of head. Has done this before if watching TV, watching boat on TV, riding in car etc. Headache seems to be intermittent currently at 4/10 level. Sneezing at times, slight congestoin, no drainage in throat or cough. Afebrile. Has been taking ginger in gingerale which helps some. One day at beginning turning fan causing sharp pain in head when hearing click at turns but not happening now. Gave care information for nausea, headache. Will plan OV yet this week unless Sx completely gone. eyes watery at first but not the last week. nnn Protocol(s) Used: Headache Recommended Outcome per Protocol: Provide Home/Self Care Reason for Outcome: All other situations Care Advice: ~ 03/28/2011 12:04:43PM Page 1 of 1 [email or web address] [facility] Triage Call Report Triage Record Num: [id number] Operator: Revonda Humphrey Patient Name: Tina Pacheco Call Date & Time: 03/28/2011 11:25:18AM Patient Phone: [phone] PCP: Ruthe Mannan Patient Gender: Female PCP Fax : [phone] Patient DOB: 13-May-1950 Practice Name[facility] Reason for Call: Caller: Blaike/Patient; PCP: Ruthe Mannan Nestor Ramp); CB#: [phone]; ; ; Call regarding Headache and Nausea; Watching TV movie made with hand held cameras and became nauseous with headache 2 weeks  ago. Sx got so back that had to go to bed in dark quiet place but nausea got worse. Pain in head on top of head and head felt full from ears up like heavy feeling in top of head. Has done this before if watching TV, watching boat on TV, riding in car etc. Headache seems to be intermittent currently at 4/10 level. Sneezing at times, slight congestoin, no drainage in throat or cough. Afebrile. Has been taking ginger in gingerale which helps some. One day at beginning turning fan causing sharp pain in head when hearing click at turns but not happening now. Gave care information for nausea, headache. Will plan OV yet this week unless Sx completely gone. eyes watery at first but not the last week. nnn Protocol(s) Used: Nausea or Vomiting Recommended Outcome per Protocol: See Provider within 72 Hours Reason for Outcome: Loss of appetite for 3 or more days OR nausea for 7 or more days Care Advice: ~ Keep a diary of all food and drink that has been consumed until evaluated by provider. Nausea Care Advice: - Drink small amounts of clear, sweetened liquids or ice cold drinks. - Eat light, bland foods such as saltine crackers or plain bread. - Do not eat high fat, highly seasoned, high fiber, or high sugar content foods. - Avoid mixing hot food and cold foods. - Eat smaller, more frequent meals. - Rest as much as possible in a sitting or in a propped lying position. Do not lie flat for at least 2 hours after eating. - Do not take pain medication (such as aspirin, NSAIDs) while nauseated. - Rest as much as possible until symptoms improve since activity may worsen nausea. ~  03/28/2011 12:04:45PM Page 1 of 1 [email or web address]

## 2011-03-28 NOTE — Telephone Encounter (Signed)
Spoke to patient and was advised that she has tried what the nurse recommended. Patient states that she is feeling some better. Patient states that the symptoms come and go and that she does have a history of motion sickness. Patient states that she will see how she does in the next couple of days and if symptoms continue she will call back for an appointment.

## 2011-03-28 NOTE — Telephone Encounter (Signed)
Please call to check on pt. 

## 2011-03-29 ENCOUNTER — Encounter: Payer: Self-pay | Admitting: Family Medicine

## 2011-03-29 ENCOUNTER — Telehealth: Payer: Self-pay | Admitting: Family Medicine

## 2011-03-29 ENCOUNTER — Ambulatory Visit (INDEPENDENT_AMBULATORY_CARE_PROVIDER_SITE_OTHER): Payer: Federal, State, Local not specified - PPO | Admitting: Family Medicine

## 2011-03-29 VITALS — BP 170/90 | HR 72 | Temp 97.8°F | Wt 311.5 lb

## 2011-03-29 DIAGNOSIS — I10 Essential (primary) hypertension: Secondary | ICD-10-CM

## 2011-03-29 DIAGNOSIS — R42 Dizziness and giddiness: Secondary | ICD-10-CM | POA: Insufficient documentation

## 2011-03-29 MED ORDER — MECLIZINE HCL 25 MG PO TABS: 25.0000 mg | ORAL_TABLET | Freq: Three times a day (TID) | ORAL | Status: AC | PRN

## 2011-03-29 MED ORDER — DOXYCYCLINE HYCLATE 100 MG PO TABS: 100.0000 mg | ORAL_TABLET | Freq: Two times a day (BID) | ORAL | Status: DC

## 2011-03-29 NOTE — Telephone Encounter (Signed)
Returning a call to Dr. Dayton Martes with update on BP.  Patient's blood pressure has not gone down significantly.  It is reading 167/91. Please return call

## 2011-03-29 NOTE — Telephone Encounter (Signed)
Triage Record Num: [id number] Operator: Valene Bors Patient Name: Tina Pacheco Call Date & Time: 03/28/2011 4:34:59PM Patient Phone: [phone] PCP: Ruthe Mannan Patient Gender: Female PCP Fax : [phone] Patient DOB: 1950/07/29 Practice Name[facility] Reason for Call: [name] is calling about BP = 173/73 L and 186/86 on R- BP is a few years old and when she rechecked her pressure throughout the day she is getting a different reading every time. She has been nauseous and has had headache for past few days. She does have Sinus Problems was on an Antibiotic at first of month for sinus infection. Headache making her feel nauseous. Took 2 Advil Migranine at noon and helped some. Legs/feet slightly swollen. Care advice per Hypertension and Headache Protocol and URI Protocol Refernced. She is going to go to pharmacy to have BP Checked and will call back if > 180 systolic or 120 diastolic. She will also call for appnt tomorrow if stomach still bothering her and if headache persists. Protocol(s) Used: Hypertension, Diagnosed or Suspected Recommended Outcome per Protocol: Provide Home/Self Care Reason for Outcome: Single elevated blood pressure reading and has questions Care Advice: ~ Write down questions/concerns to review with provider at next visit. It is important to have blood pressure checked at least annually, or at each provider visit, especially if you have a history of high blood pressure in your family. ~ ~ HEALTH PROMOTION / MAINTENANCE Hypertension is usually diagnosed after 2 elevated blood pressure readings on separate occasions. Now is the time to review your health and decide on changes that will help you to live a healthier life style. These changes may include weight management, exercise, and reduced salt intake. High blood pressure can be controlled by taking all medication that your healthcare provider may prescribe and by following these measures. ~ ~ Call  your provider, if you have not already done so, to make an appointment for a blood pressure check. ~ CAUTIONS 03/28/2011 4:58:58PM Page 1 of 1 [email or web address] [facility] Triage Call Report Triage Record Num: [id number] Operator: Valene Bors Patient Name: Tina Pacheco Call Date & Time: 03/28/2011 4:34:59PM Patient Phone: [phone] PCP: Ruthe Mannan Patient Gender: Female PCP Fax : [phone] Patient DOB: [birth year] Practice Name[facility] Reason for Call: [name] is calling about BP = 173/73 L and 186/86 on R- BP is a few years old and when she rechecked her pressure throughout the day she is getting a different reading every time. She has been nauseous and has had headache for past few days. She does have Sinus Problems was on an Antibiotic at first of month for sinus infection. Headache making her feel nauseous. Took 2 Advil Migranine at noon and helped some. Legs/feet slightly swollen. Care advice per Hypertension and Headache Protocol and URI Protocol Refernced. She is going to go to pharmacy to have BP Checked and will call back if > 180 systolic or 120 diastolic. She will also call for appnt tomorrow if stomach still bothering her and if headache persists. Protocol(s) Used: Headache Recommended Outcome per Protocol: See Provider within 24 hours Reason for Outcome: Typical headache AND usual therapy is not available or is not working Care Advice: ~ Another adult should drive. ~ Do not drink alcoholic beverages or use tobacco products. ~ Avoid known causes and factors that trigger headaches. ~ Call provider if symptoms worsen or new symptoms develop. ~ Do not take aspirin for headache until discussing with your provider. Call EMS 911 immediately if  any of the following occur: any loss of consciousness; new confusion, drowsiness or agitation; difficulty speaking; new weakness or paralysis, severe numbness, or difficulty moving. ~ ~ SYMPTOM / CONDITION  MANAGEMENT ~ CAUTIONS ~ List, or take, all current prescription(s), nonprescription or alternative medication(s) to provider for evaluation. Most adults need to drink 6-10 eight-ounce glasses (1.2-2.0 liters) of fluids per day unless previously told to limit fluid intake for other medical reasons. Limit fluids that contain caffeine, sugar or alcohol. Urine will be a very light yellow color when you drink enough fluids. ~ Analgesic/Antipyretic Advice - Acetaminophen: Consider acetaminophen as directed on label or by pharmacist/provider for pain or fever PRECAUTIONS: - Use if there is no history of liver disease, alcoholism, or intake of three or more alcohol drinks per day - Only if approved by provider during pregnancy or when breastfeeding - During pregnancy, acetaminophen should not be taken more than 3 consecutive days without telling provider - Do not exceed recommended dose or frequency ~ Migraine Self Care: - At the first sign of a migraine apply a cold cloth or cloth-covered ice pack to your head or the back of the neck. - Apply pressure or massage scalp and temples. - Lie down in a quiet, dark room for several hours. - Minimize noise, light, and odors, especially cooking and tobacco odors. - Do not read or use a computer. ~ 03/28/2011 4:59:00PM Page 1 of 1 [email or web address]

## 2011-03-29 NOTE — Progress Notes (Signed)
Patient ID: Tina Pacheco, female    DOB: September 30, 1950, 61 y.o.   MRN: [id number]  HPI Comments: Ms. Gause is a 61 year old with complicated medical history, including  paroxysmal atrial fibrillation, echocardiogram showing normal LV unction with diastolic dysfunction, hyperlipidemia, obesity, hypertension, cardiac catheterization March 27 showing mild nonobstructive coronary artery disease also a history of asthma/COPD, suspected sleep apnea, rheumatoid arthritis here for dizziness and elevated blood pressure.  Two weeks ago, she started developing some nasal congestion and drainage.  About that time, she developed feeling that head was full and dizziness with some nausea. It is worsening by changing positions but says it is not as bad as vertigo she has had in past.  Checked her BP at home and was very elevated (as high as 200 systolic), so she came in today. BP elevated today in office.  BP: 170/90 mmHg   No CP, no SOB. No fevers but did feel a little achy past couple of days.   Outpatient Encounter Prescriptions as of 03/29/2011  Medication Sig Dispense Refill  . chlorpheniramine-HYDROcodone (TUSSIONEX PENNKINETIC ER) 10-8 MG/5ML LQCR Take one teaspoon every twelve hours as needed.       . hydrocodone-acetaminophen (LORCET-HD) 5-500 MG per capsule Take 1 capsule by mouth every 6 (six) hours as needed.        . methotrexate (RHEUMATREX) 2.5 MG tablet Take 2.5 mg by mouth once a week. Caution:Chemotherapy. Protect from light. Take six tablets weekly.       . metoprolol succinate (TOPROL-XL) 50 MG 24 hr tablet Take 1 tablet (50 mg total) by mouth daily.  30 tablet  6  . montelukast (SINGULAIR) 10 MG tablet Take 10 mg by mouth at bedtime.        . Multiple Vitamin (MULTIVITAMIN) tablet Take 1 tablet by mouth daily.        . Omega-3 Fatty Acids (FISH OIL) 1000 MG CAPS 1 dose over 46 hours.        Marland Kitchen oxybutynin (DITROPAN-XL) 10 MG 24 hr tablet Take 10 mg by mouth daily.        . potassium  chloride SA (K-DUR,KLOR-CON) 20 MEQ tablet 20 mEq. Take one tablet daily with an extra dose if taking metolazone.       . SYMBICORT 160-4.5 MCG/ACT inhaler Inhale 2 puffs into the lungs Once daily as needed.      . terazosin (HYTRIN) 10 MG capsule Take 10 mg by mouth at bedtime.        . torsemide (DEMADEX) 20 MG tablet Take 20 mg by mouth daily.        Marland Kitchen warfarin (COUMADIN) 5 MG tablet Take 1 tablet (5 mg total) by mouth daily. May increase to 2 a day  180 tablet  3  . zolpidem (AMBIEN) 10 MG tablet Take 10 mg by mouth at bedtime as needed.           Review of Systems  See HPI   Physical Exam  BP 170/90  Pulse 72  Temp(Src) 97.8 F (36.6 C) (Oral)  Wt 311 lb 8 oz (141.295 kg) BP Readings from Last 3 Encounters:  03/29/11 170/90  03/06/11 122/84  03/03/11 142/90     Constitutional: She is oriented to person, place, and time. She appears well-developed and well-nourished.       obese  HENT:  Head: Normocephalic.  Nose: Nose normal.  Mouth/Throat: +PND Ears:  Pos TM erythema bilaterally with thick fluid, right >left Eyes: Conjunctivae are normal. Pupils are  equal, round, and reactive to light, no nyastagmus.  Neck: Normal range of motion. Neck supple. No JVD present.  Cardiovascular: Normal rate, regular rhythm, S1 normal, S2 normal, normal heart sounds and intact distal pulses.  Exam reveals no gallop and no friction rub.   No murmur heard. Pulmonary/Chest: Effort normal and breath sounds normal. No respiratory distress. She has no wheezes. She has no rales. She exhibits no tenderness.  Abdominal: Soft. Bowel sounds are normal. She exhibits no distension. There is no tenderness.  Musculoskeletal: Normal range of motion. She exhibits no edema and no tenderness.  Lymphadenopathy:    She has no cervical adenopathy.  Neurological: She is alert and oriented to person, place, and time. Coordination normal.  Skin: Skin is warm and dry. No rash noted. No erythema.  Psychiatric: She  has a normal mood and affect. Her behavior is normal. Judgment and thought content normal.         Assessment and Plan   1. HTN (hypertension)   Deteriorated and etiology unclear (unlikely cardiac) but likely due to sinusitis with subsequent vertigo. Advised to take meclizine and recheck BP in 30 minutes- she will call with an update later this afternoon.  2. Dizziness  See above. Treat sinusitis with Doxycycline, avoid decongestants that can increase BP. See will call with an update.

## 2011-03-29 NOTE — Telephone Encounter (Signed)
Spoke with patient and advised her that you wanted her to hear from her tomorrow. Patient will call tomorrow with  bp numbers. Patient not in disstress and was fine with calling tomorrow. I advised if Dr. Dayton Martes had other recommendation we would call back.

## 2011-03-29 NOTE — Patient Instructions (Addendum)
Good to see you. Please call me tomorrow with an update of your blood pressures.  I would like for you to schedule your coumadin appointment a little sooner than you have scheduled, please call cardiology office.  When you get home, please take a meclizine tablet.  Check your blood pressure 30 minute later.  If still very high, please take an extra terazosin.

## 2011-03-29 NOTE — Telephone Encounter (Signed)
Returned pt's call. Since calling office, BP has come down to 150s/60s.  She feels better. Will call again tomorrow.

## 2011-03-29 NOTE — Telephone Encounter (Signed)
Pt seen in office

## 2011-03-31 ENCOUNTER — Telehealth: Payer: Self-pay | Admitting: *Deleted

## 2011-03-31 NOTE — Telephone Encounter (Signed)
Patient called to give Dr. Dayton Martes an update on BP readings.  Yesterday BP was 150/73 earlier in the day and then around 11:00pm it was 161/76.  She stated that when she was here to see the doctor, Dr. Dayton Martes thought that maybe her BP should be changed.  Uses [facility].

## 2011-03-31 NOTE — Telephone Encounter (Signed)
Thanks for update.  I will discuss with her cardiologist and let her know how we can adjust her meds.

## 2011-04-03 MED ORDER — LISINOPRIL 10 MG PO TABS: 10.0000 mg | ORAL_TABLET | Freq: Every day | ORAL | Status: DC

## 2011-04-03 NOTE — Telephone Encounter (Signed)
Rx sent to her pharmacy. Please advise pt to keep [place] posted with her blood pressure readings.

## 2011-04-03 NOTE — Telephone Encounter (Signed)
Patient advised as instructed via telephone. 

## 2011-04-03 NOTE — Telephone Encounter (Signed)
Please call pt to let her know that I have discussed her BP meds with  Dr. Mariah Milling and he  suggested several options.  Has she tried lisinopril or accupril?  I do no see it on her list of intolerances?  If not, let's try this first.

## 2011-04-03 NOTE — Telephone Encounter (Signed)
Patient advised as instructed via telephone, she has not tried Lisinopril or Accupril.  Uses. [facility].

## 2011-04-06 ENCOUNTER — Telehealth: Payer: Self-pay | Admitting: *Deleted

## 2011-04-06 MED ORDER — DOXYCYCLINE HYCLATE 100 MG PO TABS: 100.0000 mg | ORAL_TABLET | Freq: Two times a day (BID) | ORAL | Status: AC

## 2011-04-06 NOTE — Telephone Encounter (Signed)
Patient advised as instructed via telephone. 

## 2011-04-06 NOTE — Telephone Encounter (Signed)
Let's extend for 5 more days.  Rx already sent.  Please call back before next wedsmorning (I will out of office for 10 days after) with an update.

## 2011-04-06 NOTE — Telephone Encounter (Signed)
Patient called to let Dr. Dayton Martes know that she still doesn't feel well from time to time.  She complains of headaches and nausea, she stated that her BP has been running ok but she just doesn't know why she continues to have a headache.  She will finish the doxycycline on 04/08/2011 and wanted to know should the course be extended?  Please advise.

## 2011-04-10 ENCOUNTER — Telehealth: Payer: Self-pay | Admitting: *Deleted

## 2011-04-10 NOTE — Telephone Encounter (Signed)
Patient advised as instructed via telephone.  She stated that she has been taking [employer] brand sinus pressure congestion relief and Advil Migraine.  Her BP has been running 120's/70's.  She will get some Allegra D and let us know how she feels.

## 2011-04-10 NOTE — Telephone Encounter (Signed)
Patient called to give Dr. Dayton Martes an update on her symptoms.  She stated that she continues to have a headache, and is nauseated.  She feels like the Doxycycline is not helping at all.  She has been taking OTC medicine for congestion and it doesn't seem to be doing any good either.  Please advise.

## 2011-04-10 NOTE — Telephone Encounter (Signed)
Noted! Thank you

## 2011-04-10 NOTE — Telephone Encounter (Signed)
All decongestants are OTC. What exactly is she taking? I would recommend Allegra.  Allegra D works best but has sudafed so you have to use cautiously with high blood pressure.

## 2011-04-12 ENCOUNTER — Telehealth: Payer: Self-pay | Admitting: *Deleted

## 2011-04-12 ENCOUNTER — Ambulatory Visit (INDEPENDENT_AMBULATORY_CARE_PROVIDER_SITE_OTHER): Payer: Federal, State, Local not specified - PPO | Admitting: Emergency Medicine

## 2011-04-12 DIAGNOSIS — I4891 Unspecified atrial fibrillation: Secondary | ICD-10-CM

## 2011-04-12 DIAGNOSIS — Z7901 Long term (current) use of anticoagulants: Secondary | ICD-10-CM

## 2011-04-12 MED ORDER — HYDROCOD POLST-CHLORPHEN POLST 10-8 MG/5ML PO LQCR: 5.0000 mL | Freq: Two times a day (BID) | ORAL | Status: DC | PRN

## 2011-04-12 NOTE — Telephone Encounter (Signed)
Please call in as entered below. 

## 2011-04-12 NOTE — Telephone Encounter (Signed)
Patient stated that Allegra D is helping her but now she has developed a cough and sore throat.  She is requesting Tussinex for cough.  [facility]ses [facility].

## 2011-04-12 NOTE — Telephone Encounter (Signed)
Rx called to [facility] and patient was notified as instructed via telephone.

## 2011-04-14 ENCOUNTER — Telehealth: Payer: Self-pay

## 2011-04-14 NOTE — Telephone Encounter (Signed)
Tina Pacheco has a colonoscopy scheduled for 06/05/2011. She is taking coumadin and will need to be off five days prior to the procedure.  Please advise if colonoscopy okay & if okay to come off the coumadin five days prior.

## 2011-04-17 NOTE — Telephone Encounter (Signed)
DR [facility] OFFICE AWARE PT MAY HOLD  COUMADIN PRIOR TO COLONOSCOPY  PHONE NOTE FAXED TO DR MANN'S OFFICE  WITH DR Windell Hummingbird RESPONSE TO INITIAL MESSAGE./CY

## 2011-04-17 NOTE — Telephone Encounter (Signed)
Come off warfarin five days before colo and then back on after. No bridge needed. She is mostly NSR

## 2011-04-19 ENCOUNTER — Encounter: Payer: Federal, State, Local not specified - PPO | Admitting: Emergency Medicine

## 2011-05-04 ENCOUNTER — Encounter: Payer: Self-pay | Admitting: Family Medicine

## 2011-05-04 ENCOUNTER — Ambulatory Visit (INDEPENDENT_AMBULATORY_CARE_PROVIDER_SITE_OTHER): Payer: Federal, State, Local not specified - PPO | Admitting: Family Medicine

## 2011-05-04 VITALS — BP 110/60 | HR 76 | Temp 97.5°F | Wt 292.0 lb

## 2011-05-04 DIAGNOSIS — I4891 Unspecified atrial fibrillation: Secondary | ICD-10-CM

## 2011-05-04 DIAGNOSIS — I10 Essential (primary) hypertension: Secondary | ICD-10-CM

## 2011-05-04 DIAGNOSIS — M069 Rheumatoid arthritis, unspecified: Secondary | ICD-10-CM

## 2011-05-04 MED ORDER — LISINOPRIL 10 MG PO TABS: 10.0000 mg | ORAL_TABLET | Freq: Every day | ORAL | Status: DC

## 2011-05-04 MED ORDER — ROSUVASTATIN CALCIUM 10 MG PO TABS: 10.0000 mg | ORAL_TABLET | Freq: Every day | ORAL | Status: DC

## 2011-05-04 MED ORDER — MONTELUKAST SODIUM 10 MG PO TABS: 10.0000 mg | ORAL_TABLET | Freq: Every day | ORAL | Status: DC

## 2011-05-04 MED ORDER — HYDROCOD POLST-CHLORPHEN POLST 10-8 MG/5ML PO LQCR: 5.0000 mL | Freq: Two times a day (BID) | ORAL | Status: DC | PRN

## 2011-05-04 MED ORDER — TORSEMIDE 20 MG PO TABS: 20.0000 mg | ORAL_TABLET | Freq: Every day | ORAL | Status: DC

## 2011-05-04 NOTE — Progress Notes (Signed)
Patient ID: Tina Pacheco, female    DOB: 1950-03-05, 61 y.o.   MRN: [id number]  HPI Comments: Ms. Litzenberger is a 61 year old with complicated medical history, including  paroxysmal atrial fibrillation, echocardiogram showing normal LV unction with diastolic dysfunction, hyperlipidemia, obesity, hypertension, cardiac catheterization March 27 showing mild nonobstructive coronary artery disease also a history of asthma/COPD, suspected sleep apnea, rheumatoid arthritis here for dizziness and elevated blood pressure.  In January, had episodes of increased BP and dizziness,  Treated for vertigo with meclizine and added  Lisinopril 10 mg to her Toprol (also takes Demadex and Terazosin).  BP much improve.  No CP, no SOB.  Dizziness is resolving as well.  A fib-asymptomatic. Denies any palpitations, CP or fatigue.  Last episodes of atrial fibrillation were March and July 2012.  Followed by Dr. Mariah Milling.   RA- has been on MTX for at least 20 years.  Mainly in feet and knees, pretty well controlled although has OA too and sometimes difficult to delineate symptoms   Scheduled for colonoscopy next month.   Outpatient Encounter Prescriptions as of 05/04/2011  Medication Sig Dispense Refill  . chlorpheniramine-HYDROcodone (TUSSIONEX PENNKINETIC ER) 10-8 MG/5ML LQCR Take 5 mLs by mouth every 12 (twelve) hours as needed. Take one teaspoon every twelve hours as needed.  140 mL  0  . hydrocodone-acetaminophen (LORCET-HD) 5-500 MG per capsule Take 1 capsule by mouth every 6 (six) hours as needed.        Marland Kitchen lisinopril (PRINIVIL,ZESTRIL) 10 MG tablet Take 1 tablet (10 mg total) by mouth daily.  30 tablet  1  . methotrexate (RHEUMATREX) 2.5 MG tablet Take 2.5 mg by mouth once a week. Caution:Chemotherapy. Protect from light. Take six tablets weekly.       . metoprolol succinate (TOPROL-XL) 50 MG 24 hr tablet Take 1 tablet (50 mg total) by mouth daily.  30 tablet  6  . montelukast (SINGULAIR) 10 MG tablet Take 10  mg by mouth at bedtime.        . Multiple Vitamin (MULTIVITAMIN) tablet Take 1 tablet by mouth daily.        . Omega-3 Fatty Acids (FISH OIL) 1000 MG CAPS 1 dose over 46 hours.        Marland Kitchen oxybutynin (DITROPAN-XL) 10 MG 24 hr tablet Take 10 mg by mouth daily.        . potassium chloride SA (K-DUR,KLOR-CON) 20 MEQ tablet 20 mEq. Take one tablet daily with an extra dose if taking metolazone.       . SYMBICORT 160-4.5 MCG/ACT inhaler Inhale 2 puffs into the lungs Once daily as needed.      . terazosin (HYTRIN) 10 MG capsule Take 10 mg by mouth at bedtime.        . torsemide (DEMADEX) 20 MG tablet Take 20 mg by mouth daily.        Marland Kitchen warfarin (COUMADIN) 5 MG tablet Take 1 tablet (5 mg total) by mouth daily. May increase to 2 a day  180 tablet  3  . zolpidem (AMBIEN) 10 MG tablet Take 10 mg by mouth at bedtime as needed.           Review of Systems  See HPI   Physical Exam  BP 110/60  Pulse 76  Temp(Src) 97.5 F (36.4 C) (Oral)  Wt 292 lb (132.45 kg)  Constitutional: She is oriented to person, place, and time. She appears well-developed and well-nourished.       obese  HENT:  Head:  Normocephalic.  Nose: Nose normal.  Eyes: Conjunctivae are normal. Pupils are equal, round, and reactive to light, no nyastagmus.  Neck: Normal range of motion. Neck supple. No JVD present.  Cardiovascular: Normal rate, regular rhythm, S1 normal, S2 normal, normal heart sounds and intact distal pulses.  Exam reveals no gallop and no friction rub.   No murmur heard. Pulmonary/Chest: Effort normal and breath sounds normal. No respiratory distress. She has no wheezes. She has no rales. She exhibits no tenderness.  Abdominal: Soft. Bowel sounds are normal. She exhibits no distension. There is no tenderness.  Musculoskeletal: Normal range of motion. She exhibits no edema and no tenderness.  Lymphadenopathy:    She has no cervical adenopathy.  Neurological: She is alert and oriented to person, place, and time.  Coordination normal.  Skin: Skin is warm and dry. No rash noted. No erythema.  Psychiatric: She has a normal mood and affect. Her behavior is normal. Judgment and thought content normal.         Assessment and Plan  1. HTN (hypertension)  Much improved. Continue current meds. She has a chronic cough, does not think it was worsened by Lisinopril.  2. A fib RRR, will hold coumadin prior to colonoscopy per Dr. Mariah Milling.   3. Rheumatoid arthritis  Stable.

## 2011-05-04 NOTE — Patient Instructions (Signed)
Please call your insurance company to make sure they cover the Shingles Vaccine- is it cheaper for you to get it at your doctor's office or a pharmacy?

## 2011-05-09 ENCOUNTER — Ambulatory Visit (INDEPENDENT_AMBULATORY_CARE_PROVIDER_SITE_OTHER): Payer: Federal, State, Local not specified - PPO | Admitting: *Deleted

## 2011-05-09 DIAGNOSIS — Z23 Encounter for immunization: Secondary | ICD-10-CM

## 2011-05-10 ENCOUNTER — Ambulatory Visit (INDEPENDENT_AMBULATORY_CARE_PROVIDER_SITE_OTHER): Payer: Federal, State, Local not specified - PPO

## 2011-05-10 DIAGNOSIS — Z7901 Long term (current) use of anticoagulants: Secondary | ICD-10-CM

## 2011-05-10 DIAGNOSIS — I4891 Unspecified atrial fibrillation: Secondary | ICD-10-CM

## 2011-05-23 ENCOUNTER — Other Ambulatory Visit: Payer: Self-pay | Admitting: Family Medicine

## 2011-05-24 NOTE — Telephone Encounter (Signed)
Medicine called to pharmacy. 

## 2011-06-07 ENCOUNTER — Ambulatory Visit (INDEPENDENT_AMBULATORY_CARE_PROVIDER_SITE_OTHER): Payer: Federal, State, Local not specified - PPO

## 2011-06-07 DIAGNOSIS — Z7901 Long term (current) use of anticoagulants: Secondary | ICD-10-CM

## 2011-06-07 DIAGNOSIS — I4891 Unspecified atrial fibrillation: Secondary | ICD-10-CM

## 2011-06-12 ENCOUNTER — Encounter (HOSPITAL_COMMUNITY)
Admission: RE | Disposition: A | Discharge status: Home Health | Payer: Self-pay | Source: Ambulatory Visit | Attending: Gastroenterology

## 2011-06-12 ENCOUNTER — Encounter (HOSPITAL_COMMUNITY): Payer: Self-pay

## 2011-06-12 ENCOUNTER — Ambulatory Visit (HOSPITAL_COMMUNITY)
Admission: RE | Admit: 2011-06-12 | Discharge: 2011-06-12 | Disposition: A | Discharge status: Home Health | Payer: Federal, State, Local not specified - PPO | Source: Ambulatory Visit | Attending: Gastroenterology | Admitting: Gastroenterology

## 2011-06-12 DIAGNOSIS — K625 Hemorrhage of anus and rectum: Secondary | ICD-10-CM | POA: Insufficient documentation

## 2011-06-12 HISTORY — PX: COLONOSCOPY: SHX5424

## 2011-06-12 SURGERY — COLONOSCOPY
Anesthesia: Moderate Sedation

## 2011-06-12 MED ORDER — MIDAZOLAM HCL 10 MG/2ML IJ SOLN
INTRAMUSCULAR | Status: DC | PRN
  Administered 2011-06-12 (×3): 2 mg via INTRAVENOUS

## 2011-06-12 MED ORDER — FENTANYL CITRATE 0.05 MG/ML IJ SOLN
INTRAMUSCULAR | Status: AC
  Filled 2011-06-12: qty 2

## 2011-06-12 MED ORDER — FENTANYL CITRATE 0.05 MG/ML IJ SOLN
INTRAMUSCULAR | Status: DC | PRN
  Administered 2011-06-12 (×3): 25 ug via INTRAVENOUS

## 2011-06-12 MED ORDER — MIDAZOLAM HCL 10 MG/2ML IJ SOLN
INTRAMUSCULAR | Status: AC
  Filled 2011-06-12: qty 2

## 2011-06-12 MED ORDER — DIPHENHYDRAMINE HCL 50 MG/ML IJ SOLN
INTRAMUSCULAR | Status: AC
  Filled 2011-06-12: qty 1

## 2011-06-12 MED ORDER — SODIUM CHLORIDE 0.9 % IV SOLN
Freq: Once | INTRAVENOUS | Status: AC
  Administered 2011-06-12: 500 mL via INTRAVENOUS

## 2011-06-12 NOTE — Consult Note (Signed)
Reason for Consult: Colorectal cancer screening. Referring Physician: Ruthe Mannan, MD  Tina Pacheco is an 61 y.o. female.  HPI: Patient has a history of constipation with occasional rectal bleeding when she strains.   Past Medical History  Diagnosis Date  . Hypertension   . Dyslipidemia   . Arrhythmia     paroxysmal atrial fibrillation  . CHF (congestive heart failure)     chronic diastolic  . Morbid obesity   . Hypokalemia   . Coronary artery disease     nonobstructive CAD  . Asthma   . Rheumatoid arthritis   . Basal cell carcinoma   . Hyperplastic colonic polyp 2003    Past Surgical History  Procedure Date  . Cardiac catheterization 05/24/2010    nonobstructive CAD  . Knee arthroscopy     bilateral  . Vaginal hysterectomy   . Cholecystectomy   . Cesarean section     Family History  Problem Relation Age of Onset  . Tuberculosis Mother   . Parkinsonism Mother   . Diabetes type II Sister   . Breast cancer Sister   . Breast cancer Maternal Aunt     Social History:  reports that she has never smoked. She has never used smokeless tobacco. She reports that she drinks alcohol. She reports that she does not use illicit drugs.  Allergies:  Allergies  Allergen Reactions  . Penicillins    Medications: I have reviewed the patient's current medications.  No results found for this or any previous visit (from the past 48 hour(s)).  No results found.  Review of Systems  Constitutional: Negative.   HENT: Negative.   Respiratory: Negative.   Cardiovascular: Negative.   Gastrointestinal: Negative.   Genitourinary: Negative.   Musculoskeletal: Negative.   Skin: Negative.   Neurological: Negative.   Endo/Heme/Allergies: Negative.   Psychiatric/Behavioral: Negative.    Blood pressure 182/92, temperature 98.3 F (36.8 C), temperature source Oral, resp. rate 19, height 5' 4.25" (1.632 m), weight 141.069 kg (311 lb), SpO2 96.00%. Physical Exam  Constitutional: She is  oriented to person, place, and time. She appears well-developed and well-nourished.  HENT:  Head: Normocephalic and atraumatic.  Eyes: Conjunctivae and EOM are normal. Pupils are equal, round, and reactive to light.  Neck: Normal range of motion. Neck supple.  Cardiovascular: Normal rate, regular rhythm and normal heart sounds.   Respiratory: Effort normal and breath sounds normal.  GI: Soft. Bowel sounds are normal.  Musculoskeletal: Normal range of motion.  Neurological: She is alert and oriented to person, place, and time. She has normal reflexes.  Skin: Skin is warm and dry.  Psychiatric: She has a normal mood and affect. Her behavior is normal. Thought content normal.    Assessment/Plan: Colorectal cancer screening: Proceed with a colonoscopy at this time.  Tina Pacheco 06/12/2011, 4:49 PM

## 2011-06-12 NOTE — Discharge Instructions (Signed)
Endoscopy °Care After °Please read the instructions outlined below and refer to this sheet in the next few weeks. These discharge instructions provide you with general information on caring for yourself after you leave the hospital. Your doctor may also give you specific instructions. While your treatment has been planned according to the most current medical practices available, unavoidable complications occasionally occur. If you have any problems or questions after discharge, please call your doctor. °HOME CARE INSTRUCTIONS °Activity °· You may resume your regular activity but move at a slower pace for the next 24 hours.  °· Take frequent rest periods for the next 24 hours.  °· Walking will help expel (get rid of) the air and reduce the bloated feeling in your abdomen.  °· No driving for 24 hours (because of the anesthesia (medicine) used during the test).  °· You may shower.  °· Do not sign any important legal documents or operate any machinery for 24 hours (because of the anesthesia used during the test).  °Nutrition °· Drink plenty of fluids.  °· You may resume your normal diet.  °· Begin with a light meal and progress to your normal diet.  °· Avoid alcoholic beverages for 24 hours or as instructed by your caregiver.  °Medications °You may resume your normal medications unless your caregiver tells you otherwise. °What you can expect today °· You may experience abdominal discomfort such as a feeling of fullness or "gas" pains.  °· You may experience a sore throat for 2 to 3 days. This is normal. Gargling with salt water may help this.  °Follow-up °Your doctor will discuss the results of your test with you. °SEEK IMMEDIATE MEDICAL CARE IF: °· You have excessive nausea (feeling sick to your stomach) and/or vomiting.  °· You have severe abdominal pain and distention (swelling).  °· You have trouble swallowing.  °· You have a temperature over 100° F (37.8° C).  °· You have rectal bleeding or vomiting of blood.    °Document Released: 09/28/2003 Document Revised: 10/26/2010 Document Reviewed: 04/10/2007 °ExitCare® Patient Information ©2012 [employer]. °

## 2011-06-12 NOTE — Op Note (Signed)
[facility] [address] [place], [place]  [address]  OPERATIVE PROCEDURE REPORT  PATIENT:  Otila, Starn  MR#:  [id number] BIRTHDATE:  [birth year]  GENDER:  female ENDOSCOPIST:  Dr. Lorenza Burton, MD ASSISTANT:  Felecia Shelling, RN and Windell Hummingbird, technician.  PROCEDURE DATE:  06/12/2011 PRE-PROCEDURE PREPERATION:  The patient was prepped with 2 dulcolax tablets, one ten-ounce bottle of magnesium citrate, and a gallon of Golytely the night prior to the procedure.  The patient was fasted for 8 hours prior to the procedure. PRE-PROCEDURE PHYSICAL:  Patient has stable vital signs. Neck is supple. There is no JVD, thyromegaly or LAD. Chest clear to auscultation. S1 and S2 regular. Abdomen soft, morbidly obese, non-distended, non-tender with NABS. PROCEDURE:  Diagnostic colonoscopy. ASA CLASS:  Class III INDICATIONS:  1) Rectal bleeding  2) Colorectal cancer screening  MEDICATIONS:  Fentanyl 75 mcg & Versed 6 mg IV.  DESCRIPTION OF PROCEDURE: After the risks, benefits, and alternatives of the procedure were thoroughly explained [including a 10% missed rate of cancer and polyps], informed consent was obtained.  Digital rectal exam was performed.  The Pentax Colonoscope [id number] was introduced through the anus and advanced to the cecum, which was identified by both the appendix and ileocecal valve, without limitations.  The quality of the prep was good.. Multiple washes were done. Small lesions could be missed. The instrument was then slowly withdrawn as the colon was fully examined. <<PROCEDUREIMAGES>>  FINDINGS:  The entire colonic mucosa appeared healthy with a normal vascular pattern.  No masses, polyps, diverticula or AVM's were noted.  The appendiceal orifice and the ICV were identified and photographed.  Retroflexed views revealed small internal hemorrhoids. The patient tolerated the procedure without immediate complications. The scope was then withdrawn from the  patient and the procedure terminated.  IMPRESSION:  Small internal hemorrhoids; otherwise, normal colonoscopy up to the cecum.  RECOMMENDATIONS:  1) Continue surveillance. 2) High fiber diet with liberal fluid intake. 3) OP follow-up is advised on a PRN basis.  REPEAT EXAM:  In 10years; in case the patient has any abnormal GI symptoms in the interim, she should contact the office immediately for further recommendations.  DISCHARGE INSTRUCTIONS:  Standard discharge instructions given.  ______________________________ Dr. Lorenza Burton, MD  CPT CODES: 95621  DIAGNOSIS CODES:  569.3,  [phone]  CC:  Ruthe Mannan, M.D.  n. eSIGNED:   Dr. Lorenza Burton at 06/12/2011 05:23 PM  Devoria Glassing, [phone]

## 2011-06-13 ENCOUNTER — Encounter (HOSPITAL_COMMUNITY): Payer: Self-pay | Admitting: Gastroenterology

## 2011-06-15 ENCOUNTER — Telehealth: Payer: Self-pay | Admitting: Family Medicine

## 2011-06-15 NOTE — Telephone Encounter (Signed)
Form placed up front for pick up, pt advised. 

## 2011-06-15 NOTE — Telephone Encounter (Signed)
Pt dropped off forms for handi-cap sticker. I put them in your inbox on your desk.

## 2011-06-15 NOTE — Telephone Encounter (Signed)
In my box

## 2011-06-16 ENCOUNTER — Other Ambulatory Visit: Payer: Self-pay | Admitting: *Deleted

## 2011-06-16 MED ORDER — HYDROCODONE-ACETAMINOPHEN 5-500 MG PO CAPS: 1.0000 | ORAL_CAPSULE | Freq: Three times a day (TID) | ORAL | Status: DC | PRN

## 2011-06-16 MED ORDER — HYDROCOD POLST-CHLORPHEN POLST 10-8 MG/5ML PO LQCR: 5.0000 mL | Freq: Two times a day (BID) | ORAL | Status: DC | PRN

## 2011-06-16 NOTE — Telephone Encounter (Signed)
Script faxed to [facility]

## 2011-06-16 NOTE — Telephone Encounter (Signed)
Ok to refill per Dr. Dayton Martes. Called to [facility].

## 2011-06-16 NOTE — Telephone Encounter (Signed)
Script printed earlier today had incorrect quantity, pt requests 90 day supply.  OK per Dr. Dayton Martes, this script faxed to [facility], previous script destroyed without being faxed to pharmacy.

## 2011-06-28 ENCOUNTER — Ambulatory Visit (INDEPENDENT_AMBULATORY_CARE_PROVIDER_SITE_OTHER): Payer: Federal, State, Local not specified - PPO

## 2011-06-28 DIAGNOSIS — I4891 Unspecified atrial fibrillation: Secondary | ICD-10-CM

## 2011-06-28 DIAGNOSIS — Z7901 Long term (current) use of anticoagulants: Secondary | ICD-10-CM

## 2011-06-28 LAB — POCT INR: INR: 2.4

## 2011-07-10 ENCOUNTER — Encounter: Payer: Self-pay | Admitting: Family Medicine

## 2011-07-10 ENCOUNTER — Ambulatory Visit (INDEPENDENT_AMBULATORY_CARE_PROVIDER_SITE_OTHER): Payer: Federal, State, Local not specified - PPO | Admitting: Family Medicine

## 2011-07-10 DIAGNOSIS — J309 Allergic rhinitis, unspecified: Secondary | ICD-10-CM | POA: Insufficient documentation

## 2011-07-10 MED ORDER — HYDROCOD POLST-CHLORPHEN POLST 10-8 MG/5ML PO LQCR: 5.0000 mL | Freq: Two times a day (BID) | ORAL | Status: DC | PRN

## 2011-07-10 MED ORDER — FLUTICASONE PROPIONATE 50 MCG/ACT NA SUSP: 2.0000 | Freq: Every day | NASAL | Status: DC

## 2011-07-10 NOTE — Patient Instructions (Signed)
Claritin Allegra Zyrtec Benadryl  We are adding flonase.  Please call me if your symptoms are improving.

## 2011-07-10 NOTE — Progress Notes (Signed)
Subjective:    Patient ID: Tina Pacheco, female    DOB: 1950/09/13, 61 y.o.   MRN: [id number]  HPI  61 yo here for persistent allergy symptoms.  Past several months, worsening runny nose, sneezing, dry cough. Allegra D was helping for awhile but getting worse again. No fevers, chills or SOB. Eyes have not been watering and itching this year. No wheezing or CP.  Patient Active Problem List  Diagnoses  . Atrial fibrillation  . Encounter for long-term (current) use of anticoagulants  . Obesity  . Hyperlipidemia  . HTN (hypertension)  . CAD (coronary artery disease)  . Diastolic dysfunction  . Rheumatoid arthritis  . Dizziness  . Allergic rhinitis   Past Medical History  Diagnosis Date  . Hypertension   . Dyslipidemia   . Arrhythmia     paroxysmal atrial fibrillation  . CHF (congestive heart failure)     chronic diastolic  . Morbid obesity   . Hypokalemia   . Coronary artery disease     nonobstructive CAD  . Asthma   . Rheumatoid arthritis   . Basal cell carcinoma   . Hyperplastic colonic polyp 2003   Past Surgical History  Procedure Date  . Cardiac catheterization 05/24/2010    nonobstructive CAD  . Knee arthroscopy     bilateral  . Vaginal hysterectomy   . Cholecystectomy   . Cesarean section   . Colonoscopy 06/12/2011    Procedure: COLONOSCOPY;  Surgeon: Charna Elizabeth, MD;  Location: [facility];  Service: Endoscopy;  Laterality: N/A;   History  Substance Use Topics  . Smoking status: Never Smoker   . Smokeless tobacco: Never Used  . Alcohol Use: Yes     occasional-red wine   Family History  Problem Relation Age of Onset  . Tuberculosis Mother   . Parkinsonism Mother   . Diabetes type II Sister   . Breast cancer Sister   . Breast cancer Maternal Aunt    Allergies  Allergen Reactions  . Penicillins    Current Outpatient Prescriptions on File Prior to Visit  Medication Sig Dispense Refill  . hydrocodone-acetaminophen (LORCET-HD) 5-500 MG per  capsule Take 1 capsule by mouth every 8 (eight) hours as needed.  270 capsule  3  . lisinopril (PRINIVIL,ZESTRIL) 10 MG tablet Take 1 tablet (10 mg total) by mouth daily.  90 tablet  3  . methotrexate (RHEUMATREX) 2.5 MG tablet Take 2.5 mg by mouth once a week. Caution:Chemotherapy. Protect from light. Take six tablets weekly.       . metoprolol succinate (TOPROL-XL) 50 MG 24 hr tablet Take 1 tablet (50 mg total) by mouth daily.  30 tablet  6  . montelukast (SINGULAIR) 10 MG tablet Take 1 tablet (10 mg total) by mouth at bedtime.  90 tablet  3  . Multiple Vitamin (MULTIVITAMIN) tablet Take 1 tablet by mouth daily.        . Omega-3 Fatty Acids (FISH OIL) 1000 MG CAPS 1 dose over 46 hours.        Marland Kitchen oxybutynin (DITROPAN-XL) 10 MG 24 hr tablet Take 10 mg by mouth daily.        . potassium chloride SA (K-DUR,KLOR-CON) 20 MEQ tablet 20 mEq. Take one tablet daily with an extra dose if taking metolazone.       . rosuvastatin (CRESTOR) 10 MG tablet Take 1 tablet (10 mg total) by mouth daily.  90 tablet  3  . Specialty Vitamins Products (MAGNESIUM, AMINO ACID CHELATE,) 133 MG tablet  Take 1 tablet by mouth once.      . SYMBICORT 160-4.5 MCG/ACT inhaler Inhale 2 puffs into the lungs Once daily as needed.      . terazosin (HYTRIN) 10 MG capsule Take 10 mg by mouth at bedtime.        . torsemide (DEMADEX) 20 MG tablet Take 1 tablet (20 mg total) by mouth daily.  90 tablet  3  . warfarin (COUMADIN) 5 MG tablet Take 1 tablet (5 mg total) by mouth daily. May increase to 2 a day  180 tablet  3  . zolpidem (AMBIEN) 10 MG tablet Take 10 mg by mouth at bedtime as needed.        . fluticasone (FLONASE) 50 MCG/ACT nasal spray Place 2 sprays into the nose daily.  16 g  6     Review of Systems See HPI    Objective:   Physical Exam BP 142/80  Pulse 88  Temp(Src) 97.9 F (36.6 C) (Oral)  Wt 318 lb (144.244 kg)  General:  Well-developed,well-nourished,in no acute distress; alert,appropriate and cooperative  throughout examination Head:  normocephalic and atraumatic.   Eyes:  vision grossly intact, pupils equal, pupils round, and pupils reactive to light.   Ears:  R ear normal and L ear normal.   Nose:  no external deformity, swelling and erythema, boggy turbinates, no obstruction or tenderness to palpation.   Mouth:  good dentition, pos PND  Lungs:  Normal respiratory effort, chest expands symmetrically. Lungs are clear to auscultation, no crackles or wheezes. Heart:  Normal rate and regular rhythm. S1 and S2 normal without gallop, murmur, click, rub or other extra sounds. Skin:  Intact without suspicious lesions or rashes Cervical Nodes:  No lymphadenopathy noted Psych:  Cognition and judgment appear intact. Alert and cooperative with normal attention span and concentration. No apparent delusions, illusions, hallucinations     Assessment & Plan:   1. Allergic rhinitis    Deteriorated but does not seem consistent with infectious process. Discussed alternating different antihistamines- see pt instructions. Restart flonase- rx sent. The patient indicates understanding of these issues and agrees with the plan.

## 2011-07-17 ENCOUNTER — Other Ambulatory Visit: Payer: Self-pay | Admitting: *Deleted

## 2011-07-17 MED ORDER — WARFARIN SODIUM 5 MG PO TABS: 5.0000 mg | ORAL_TABLET | Freq: Every day | ORAL | Status: DC

## 2011-07-21 ENCOUNTER — Other Ambulatory Visit: Payer: Self-pay | Admitting: Family Medicine

## 2011-07-21 ENCOUNTER — Ambulatory Visit (INDEPENDENT_AMBULATORY_CARE_PROVIDER_SITE_OTHER): Payer: Federal, State, Local not specified - PPO | Admitting: Family Medicine

## 2011-07-21 ENCOUNTER — Ambulatory Visit: Payer: Federal, State, Local not specified - PPO | Admitting: Family Medicine

## 2011-07-21 ENCOUNTER — Encounter: Payer: Self-pay | Admitting: Family Medicine

## 2011-07-21 VITALS — BP 140/88 | HR 72 | Temp 97.8°F | Wt 304.0 lb

## 2011-07-21 DIAGNOSIS — M62838 Other muscle spasm: Secondary | ICD-10-CM | POA: Insufficient documentation

## 2011-07-21 MED ORDER — CYCLOBENZAPRINE HCL 10 MG PO TABS: 10.0000 mg | ORAL_TABLET | Freq: Three times a day (TID) | ORAL | Status: AC | PRN

## 2011-07-21 NOTE — Patient Instructions (Signed)
Please take muscle relaxants as directed. Apply heat. If symptoms do not improve, please go to the ER over the weekend.

## 2011-07-21 NOTE — Progress Notes (Signed)
Patient ID: CIERRAH DACE, female    DOB: 07-07-1950, 61 y.o.   MRN: [id number]  HPI Comments: [name] is a 61 year old with complicated medical history, including  paroxysmal atrial fibrillation, CAD, RA here for acute onset of left shoulder, neck and scapular pain.  She was laying in bed, turned her head the wrong way and had acute onset of this pain. Cannot turn her head to left without significant pain- mainly in left neck, upper shoulder and "back of shoulder." No left arm tingling or weakness. No CP. No SOB or diaphoresis.  Vicodin not helping with pain.   Outpatient Encounter Prescriptions as of 07/21/2011  Medication Sig Dispense Refill  . chlorpheniramine-HYDROcodone (TUSSIONEX) 10-8 MG/5ML LQCR take 5 milliliters by mouth every 12 hours if needed  140 mL  0  . fluticasone (FLONASE) 50 MCG/ACT nasal spray Place 2 sprays into the nose daily.  16 g  6  . hydrocodone-acetaminophen (LORCET-HD) 5-500 MG per capsule Take 1 capsule by mouth every 8 (eight) hours as needed.  270 capsule  3  . lisinopril (PRINIVIL,ZESTRIL) 10 MG tablet Take 1 tablet (10 mg total) by mouth daily.  90 tablet  3  . methotrexate (RHEUMATREX) 2.5 MG tablet Take 2.5 mg by mouth once a week. Caution:Chemotherapy. Protect from light. Take six tablets weekly.       . metoprolol succinate (TOPROL-XL) 50 MG 24 hr tablet Take 1 tablet (50 mg total) by mouth daily.  30 tablet  6  . montelukast (SINGULAIR) 10 MG tablet Take 1 tablet (10 mg total) by mouth at bedtime.  90 tablet  3  . Multiple Vitamin (MULTIVITAMIN) tablet Take 1 tablet by mouth daily.        . Omega-3 Fatty Acids (FISH OIL) 1000 MG CAPS 1 dose over 46 hours.        Marland Kitchen oxybutynin (DITROPAN-XL) 10 MG 24 hr tablet Take 10 mg by mouth daily.        . potassium chloride SA (K-DUR,KLOR-CON) 20 MEQ tablet 20 mEq. Take one tablet daily with an extra dose if taking metolazone.       . rosuvastatin (CRESTOR) 10 MG tablet Take 1 tablet (10 mg total) by mouth  daily.  90 tablet  3  . Specialty Vitamins Products (MAGNESIUM, AMINO ACID CHELATE,) 133 MG tablet Take 1 tablet by mouth once.      . SYMBICORT 160-4.5 MCG/ACT inhaler Inhale 2 puffs into the lungs Once daily as needed.      . terazosin (HYTRIN) 10 MG capsule Take 10 mg by mouth at bedtime.        . torsemide (DEMADEX) 20 MG tablet Take 1 tablet (20 mg total) by mouth daily.  90 tablet  3  . warfarin (COUMADIN) 5 MG tablet Take 1 tablet (5 mg total) by mouth daily. May increase to 2 a day  180 tablet  3  . zolpidem (AMBIEN) 10 MG tablet Take 10 mg by mouth at bedtime as needed.        . cyclobenzaprine (FLEXERIL) 10 MG tablet Take 1 tablet (10 mg total) by mouth 3 (three) times daily as needed for muscle spasms.  30 tablet  0     Review of Systems  See HPI   Physical Exam  BP 140/88  Pulse 72  Temp 97.8 F (36.6 C)  Wt 304 lb (137.893 kg)  Constitutional: She is oriented to person, place, and time. She appears well-developed and well-nourished.  obese  HENT:  Head: Normocephalic.  Nose: Nose normal.  Eyes: Conjunctivae are normal. Pupils are equal, round, and reactive to light, no nyastagmus.  Neck: Normal range of motion. Neck supple. No JVD present.  Cardiovascular: Normal rate, regular rhythm, S1 normal, S2 normal, normal heart sounds and intact distal pulses.  Exam reveals no gallop and no friction rub.   No murmur heard. Pulmonary/Chest: Effort normal and breath sounds normal. No respiratory distress. She has no wheezes. She has no rales. She exhibits no tenderness.  Abdominal: Soft. Bowel sounds are normal. She exhibits no distension. There is no tenderness.  Musculoskeletal: TTP over entire left trapezius, pos palpable muscle spasm, unable to lift arm/shoulder to to pain over trapezius area that radiates to shoulder, pain elicited in office with movement of head, neck and arm. Lymphadenopathy:    She has no cervical adenopathy.  Neurological: She is alert and oriented  to person, place, and time. Coordination normal.  Skin: Skin is warm and dry. No rash noted. No erythema.  Psychiatric: She has a normal mood and affect. Her behavior is normal. Judgment and thought content normal.         Assessment and Plan   1. Trapezius muscle spasm   New- reproducibility of pain very reassuring for MSK origin and not cardiac. Treat with flexeril, Heat and rest. See pt instructions for full details and red flag symptoms.

## 2011-07-21 NOTE — Telephone Encounter (Signed)
Medicine called to [facility]. 

## 2011-08-02 ENCOUNTER — Ambulatory Visit (INDEPENDENT_AMBULATORY_CARE_PROVIDER_SITE_OTHER): Payer: Federal, State, Local not specified - PPO

## 2011-08-02 ENCOUNTER — Other Ambulatory Visit: Payer: Self-pay | Admitting: Family Medicine

## 2011-08-02 DIAGNOSIS — I4891 Unspecified atrial fibrillation: Secondary | ICD-10-CM

## 2011-08-02 DIAGNOSIS — Z7901 Long term (current) use of anticoagulants: Secondary | ICD-10-CM

## 2011-08-02 LAB — POCT INR: INR: 2.6

## 2011-08-02 NOTE — Telephone Encounter (Signed)
Medicine called to [facility]. 

## 2011-08-14 ENCOUNTER — Other Ambulatory Visit: Payer: Self-pay | Admitting: Family Medicine

## 2011-08-14 NOTE — Telephone Encounter (Signed)
Received refill request electronically from pharmacy. Called and spoke to patient and was advised that she still has a cough that is productive at time. Patient states that she does not have a fever, but a tickling in her throat that causes here to cough all the time. Is it okay to refill medication?

## 2011-08-14 NOTE — Telephone Encounter (Signed)
Can wait until AM - would rather not refill myself in patient on coumadin that I do not know

## 2011-08-16 NOTE — Telephone Encounter (Signed)
Yes ok to refill. If cough persists, I need to see her.

## 2011-08-16 NOTE — Telephone Encounter (Signed)
Medicine called to [facility], pt's voice mail was full, so I couldn't leave message advising her.

## 2011-08-17 ENCOUNTER — Ambulatory Visit (INDEPENDENT_AMBULATORY_CARE_PROVIDER_SITE_OTHER): Payer: Federal, State, Local not specified - PPO | Admitting: Family Medicine

## 2011-08-17 ENCOUNTER — Encounter: Payer: Self-pay | Admitting: Family Medicine

## 2011-08-17 VITALS — BP 150/90 | HR 84 | Temp 98.0°F | Wt 315.0 lb

## 2011-08-17 DIAGNOSIS — I10 Essential (primary) hypertension: Secondary | ICD-10-CM

## 2011-08-17 DIAGNOSIS — R05 Cough: Secondary | ICD-10-CM

## 2011-08-17 MED ORDER — BENZONATATE 200 MG PO CAPS: 200.0000 mg | ORAL_CAPSULE | Freq: Two times a day (BID) | ORAL | Status: DC | PRN

## 2011-08-17 MED ORDER — ZOLPIDEM TARTRATE 10 MG PO TABS: 10.0000 mg | ORAL_TABLET | Freq: Every evening | ORAL | Status: DC | PRN

## 2011-08-17 MED ORDER — HYDROCHLOROTHIAZIDE 25 MG PO TABS: 25.0000 mg | ORAL_TABLET | Freq: Every day | ORAL | Status: DC

## 2011-08-17 NOTE — Progress Notes (Signed)
Patient ID: Tina Pacheco, female    DOB: November 29, 1950, 61 y.o.   MRN: [id number]  HPI Comments: Tina Pacheco is a 61 year old with complicated medical history, including  paroxysmal atrial fibrillation, echocardiogram showing normal LV unction with diastolic dysfunction, hyperlipidemia, obesity, hypertension, cardiac catheterization March 27 showing mild nonobstructive coronary artery disease also a history of asthma/COPD, suspected sleep apnea, rheumatoid arthritis here for follow up.    Chronic cough- has episodes of tickle in her throat that leads to terrible cough, often has post tussive emesis. Felt this was secondary to her allergic rhinitis- currently taking flonase, singulair, allegra and symbicort.  Tussionex help with cough for short period of time but cough has persisted. Not productive. No CP.  No SOB (other than when she is coughing).  We added  Lisinopril 10 mg to her Toprol (also takes Demadex and Terazosin) earlier this year, BP had improved.    Outpatient Encounter Prescriptions as of 08/17/2011  Medication Sig Dispense Refill  . chlorpheniramine-HYDROcodone (TUSSIONEX) 10-8 MG/5ML LQCR take 1 teaspoonful by mouth every 12 hours if needed  140 mL  0  . fexofenadine (ALLEGRA) 180 MG tablet Take 180 mg by mouth daily.      . fluticasone (FLONASE) 50 MCG/ACT nasal spray Place 2 sprays into the nose daily.  16 g  6  . hydrocodone-acetaminophen (LORCET-HD) 5-500 MG per capsule Take 1 capsule by mouth every 8 (eight) hours as needed.  270 capsule  3  . methotrexate (RHEUMATREX) 2.5 MG tablet Take 2.5 mg by mouth once a week. Caution:Chemotherapy. Protect from light. Take six tablets weekly.       . metoprolol succinate (TOPROL-XL) 50 MG 24 hr tablet Take 1 tablet (50 mg total) by mouth daily.  30 tablet  6  . montelukast (SINGULAIR) 10 MG tablet Take 1 tablet (10 mg total) by mouth at bedtime.  90 tablet  3  . Multiple Vitamin (MULTIVITAMIN) tablet Take 1 tablet by mouth daily.         . Omega-3 Fatty Acids (FISH OIL) 1000 MG CAPS 1 dose over 46 hours.        Marland Kitchen oxybutynin (DITROPAN-XL) 10 MG 24 hr tablet Take 10 mg by mouth daily.        . potassium chloride SA (K-DUR,KLOR-CON) 20 MEQ tablet 20 mEq. Take one tablet daily with an extra dose if taking metolazone.       . rosuvastatin (CRESTOR) 10 MG tablet Take 1 tablet (10 mg total) by mouth daily.  90 tablet  3  . Specialty Vitamins Products (MAGNESIUM, AMINO ACID CHELATE,) 133 MG tablet Take 1 tablet by mouth once.      . SYMBICORT 160-4.5 MCG/ACT inhaler Inhale 2 puffs into the lungs Once daily as needed.      . terazosin (HYTRIN) 10 MG capsule Take 10 mg by mouth at bedtime.        . torsemide (DEMADEX) 20 MG tablet Take 1 tablet (20 mg total) by mouth daily.  90 tablet  3  . warfarin (COUMADIN) 5 MG tablet Take 1 tablet (5 mg total) by mouth daily. May increase to 2 a day  180 tablet  3  . zolpidem (AMBIEN) 10 MG tablet Take 1 tablet (10 mg total) by mouth at bedtime as needed.  30 tablet  1  . DISCONTD: lisinopril (PRINIVIL,ZESTRIL) 10 MG tablet Take 1 tablet (10 mg total) by mouth daily.  90 tablet  3  . DISCONTD: zolpidem (AMBIEN) 10 MG tablet Take  10 mg by mouth at bedtime as needed.        . benzonatate (TESSALON) 200 MG capsule Take 1 capsule (200 mg total) by mouth 2 (two) times daily as needed for cough.  30 capsule  0  . hydrochlorothiazide (HYDRODIURIL) 25 MG tablet Take 1 tablet (25 mg total) by mouth daily.  90 tablet  3     Review of Systems  See HPI   Physical Exam  BP 150/90  Pulse 84  Temp 62 F (36.7 C)  Wt 315 lb (142.883 kg)  Constitutional: She is oriented to person, place, and time. She appears well-developed and well-nourished.       obese  HENT:  Head: Normocephalic.  Nose: Nose normal.  Eyes: Conjunctivae are normal. Pupils are equal, round, and reactive to light, no nyastagmus.  Neck: Normal range of motion. Neck supple. No JVD present.  Cardiovascular: Normal rate, regular rhythm,  S1 normal, S2 normal, normal heart sounds and intact distal pulses.  Exam reveals no gallop and no friction rub.   No murmur heard. Pulmonary/Chest: Effort normal and breath sounds normal. No respiratory distress. She has no wheezes. She has no rales. She exhibits no tenderness.  Abdominal: Soft. Bowel sounds are normal. She exhibits no distension. There is no tenderness.  Musculoskeletal: Normal range of motion. She exhibits no edema and no tenderness.  Lymphadenopathy:    She has no cervical adenopathy.  Neurological: She is alert and oriented to person, place, and time. Coordination normal.  Skin: Skin is warm and dry. No rash noted. No erythema.  Psychiatric: She has a normal mood and affect. Her behavior is normal. Judgment and thought content normal.         Assessment and Plan  1. Chronic cough  Deteriorated. ? Possible link to ACEI.  Lungs are clear. Will d/c ACEI, add mucinex and tessalon.  2. HTN (hypertension)  See above. D/c lisinopril and add HCTZ (already taking potassium due to demadex use). If cough improves, will continue this and recheck labs in 8 weeks. She will follow up with me in 2 weeks. The patient indicates understanding of these issues and agrees with the plan.

## 2011-08-17 NOTE — Patient Instructions (Addendum)
Let's add some mucinex. Try tessalon during the day. Continue your allergy medication. Let's stop the lisinopril. Start HCTZ- come in 2 weeks to get blood pressure recheck.

## 2011-08-25 ENCOUNTER — Other Ambulatory Visit: Payer: Self-pay | Admitting: *Deleted

## 2011-08-25 MED ORDER — POTASSIUM CHLORIDE CRYS ER 20 MEQ PO TBCR: EXTENDED_RELEASE_TABLET | ORAL | Status: DC

## 2011-08-25 NOTE — Telephone Encounter (Signed)
Refilled Klor-Con 

## 2011-08-28 ENCOUNTER — Telehealth: Payer: Self-pay | Admitting: Family Medicine

## 2011-08-28 NOTE — Telephone Encounter (Signed)
Triage Record Num: [id number] Operator: Lesli Albee Patient Name: Tina Pacheco Call Date & Time: 08/26/2011 4:24:18PM Patient Phone: [phone] PCP: Ruthe Mannan Patient Gender: Female PCP Fax : [phone] Patient DOB: Nov 27, 1950 Practice Name[facility] Reason for Call: Caller: [name]/Patient; PCP: Ruthe Mannan Nestor Ramp); CB#: [phone]; Call regarding Gout pain - episode; Pt is calling about gout pain. She has taken both doses of Colchrys and is asking what else can be done. Pt has a hx of gout and had her last attack in Feb. RN provided information. Protocol(s) Used: Medication Questions - Adult Recommended Outcome per Protocol: Provided Health Information Reason for Outcome: Requested information on how to safely dispose of expired or unused medications Care Advice: Safe disposal of medication: - Do not flush medications down the toilet unless product information states it is okay - Use the community drug take-back program (if available) that allows you to take unused medication to a specific location for disposal. Call your city or county government's trash and recycling service to see if the service is available in your community. Your local pharmacist will also usually be able to provide appropriate information for you. - If a take-back program is not available, do the following: - Take medication out of the original containers. Empty capsules and crush solid medications. Mix the crushed material or liquid medication with an undesirable absorbing substance, such as used coffee grounds or kitty litter, to make them unappealing to children, animals or anyone going through the trash. - Place the material in a sealable bag, empty can with a lid, or other container to prevent the medication from leaking or breaking out of a garbage bag. - Remove and destroy identifying personal information (prescription labels) from containers before throwing them in the recycle  bin. ~ 08/26/2011 4:38:07PM Page 1 of 1 [id number]

## 2011-08-30 ENCOUNTER — Ambulatory Visit (INDEPENDENT_AMBULATORY_CARE_PROVIDER_SITE_OTHER)
Admission: RE | Admit: 2011-08-30 | Discharge: 2011-08-30 | Disposition: A | Discharge status: Home / Self Care | Payer: Federal, State, Local not specified - PPO | Source: Ambulatory Visit | Attending: Family Medicine | Admitting: Family Medicine

## 2011-08-30 ENCOUNTER — Ambulatory Visit (INDEPENDENT_AMBULATORY_CARE_PROVIDER_SITE_OTHER): Payer: Federal, State, Local not specified - PPO | Admitting: Family Medicine

## 2011-08-30 ENCOUNTER — Encounter: Payer: Self-pay | Admitting: Family Medicine

## 2011-08-30 VITALS — BP 148/110 | HR 84 | Temp 98.0°F | Wt 322.0 lb

## 2011-08-30 DIAGNOSIS — R05 Cough: Secondary | ICD-10-CM

## 2011-08-30 DIAGNOSIS — R059 Cough, unspecified: Secondary | ICD-10-CM

## 2011-08-30 NOTE — Patient Instructions (Addendum)
I will call you with your xray results this afternoon. If this continues, then we do need to see ENT.

## 2011-08-30 NOTE — Progress Notes (Signed)
Patient ID: Tina Pacheco, female    DOB: 1950/05/03, 61 y.o.   MRN: [id number]  HPI Comments: Tina Pacheco is a 61 year old with complicated medical history, including  paroxysmal atrial fibrillation, echocardiogram showing normal LV unction with diastolic dysfunction, hyperlipidemia, obesity, hypertension, cardiac catheterization March 27 showing mild nonobstructive coronary artery disease also a history of asthma/COPD, suspected sleep apnea, rheumatoid arthritis here for follow up.    Chronic cough- has episodes of tickle in her throat that leads to terrible cough, often has post tussive emesis. Felt this was secondary to her allergic rhinitis- currently taking flonase, singulair, allegra and symbicort.    No CP.  No SOB (other than when she is coughing).  Two weeks ago, D/c'd lisinopril and add HCTZ (already taking potassium due to demadex use) to see if it would help with cough.  Also added Mucinex and Tessalon.  She feels cough as improved but still has these terrible coughing spells with post tussive emesis.  No difficulty swallowing.   Outpatient Encounter Prescriptions as of 62/04/2011  Medication Sig Dispense Refill  . chlorpheniramine-HYDROcodone (TUSSIONEX) 10-8 MG/5ML LQCR take 1 teaspoonful by mouth every 12 hours if needed  140 mL  0  . fexofenadine (ALLEGRA) 180 MG tablet Take 180 mg by mouth daily.      . fluticasone (FLONASE) 50 MCG/ACT nasal spray Place 2 sprays into the nose daily.  16 g  6  . hydrochlorothiazide (HYDRODIURIL) 25 MG tablet Take 1 tablet (25 mg total) by mouth daily.  90 tablet  3  . hydrocodone-acetaminophen (LORCET-HD) 5-500 MG per capsule Take 1 capsule by mouth every 8 (eight) hours as needed.  270 capsule  3  . methotrexate (RHEUMATREX) 2.5 MG tablet Take 2.5 mg by mouth once a week. Caution:Chemotherapy. Protect from light. Take six tablets weekly.       . metoprolol succinate (TOPROL-XL) 50 MG 24 hr tablet Take 1 tablet (50 mg total) by mouth  daily.  30 tablet  6  . montelukast (SINGULAIR) 10 MG tablet Take 1 tablet (10 mg total) by mouth at bedtime.  90 tablet  3  . Multiple Vitamin (MULTIVITAMIN) tablet Take 1 tablet by mouth daily.        . Omega-3 Fatty Acids (FISH OIL) 1000 MG CAPS 1 dose over 46 hours.        Marland Kitchen oxybutynin (DITROPAN-XL) 10 MG 24 hr tablet Take 10 mg by mouth daily.        . potassium chloride SA (K-DUR,KLOR-CON) 20 MEQ tablet Take one tablet daily with an extra dose if taking metolazone.  60 tablet  3  . rosuvastatin (CRESTOR) 10 MG tablet Take 1 tablet (10 mg total) by mouth daily.  90 tablet  3  . Specialty Vitamins Products (MAGNESIUM, AMINO ACID CHELATE,) 133 MG tablet Take 1 tablet by mouth once.      . SYMBICORT 160-4.5 MCG/ACT inhaler Inhale 2 puffs into the lungs Once daily as needed.      . terazosin (HYTRIN) 10 MG capsule Take 10 mg by mouth at bedtime.        . torsemide (DEMADEX) 20 MG tablet Take 1 tablet (20 mg total) by mouth daily.  90 tablet  3  . warfarin (COUMADIN) 5 MG tablet Take 1 tablet (5 mg total) by mouth daily. May increase to 2 a day  180 tablet  3  . zolpidem (AMBIEN) 10 MG tablet Take 1 tablet (10 mg total) by mouth at bedtime as needed.  30 tablet  1     Review of Systems  See HPI   Physical Exam  BP 148/110  Pulse 84  Temp 98 F (36.7 C)  Wt 322 lb (146.058 kg) 156/84 Constitutional: She is oriented to person, place, and time. She appears well-developed and well-nourished.       obese  HENT:  Head: Normocephalic.  Nose: Nose normal.  Eyes: Conjunctivae are normal. Pupils are equal, round, and reactive to light, no nyastagmus.  Neck: Normal range of motion. Neck supple. No JVD present.  Cardiovascular: Normal rate, regular rhythm, S1 normal, S2 normal, normal heart sounds and intact distal pulses.  Exam reveals no gallop and no friction rub.   No murmur heard. Pulmonary/Chest: Effort normal and breath sounds normal. No respiratory distress. She has no wheezes. She  has no rales. She exhibits no tenderness.  Abdominal: Soft. Bowel sounds are normal. She exhibits no distension. There is no tenderness.  Musculoskeletal: Normal range of motion. She exhibits no edema and no tenderness.  Lymphadenopathy:    She has no cervical adenopathy.  Neurological: She is alert and oriented to person, place, and time. Coordination normal.  Skin: Skin is warm and dry. No rash noted. No erythema.  Psychiatric: She has a normal mood and affect. Her behavior is normal. Judgment and thought content normal.         Assessment and Plan  1. Chronic cough  Improved but still having coughing spells. CXR today was negative. If symptoms do not continue to improve, will refer to ENT as she describes "tickle" in her throat that is often unilateral- may be right or left.   2. HTN (hypertension)  Stable on HCTZ and off ACEI.

## 2011-08-31 ENCOUNTER — Other Ambulatory Visit: Payer: Self-pay | Admitting: Family Medicine

## 2011-09-01 ENCOUNTER — Other Ambulatory Visit: Payer: Self-pay | Admitting: Family Medicine

## 2011-09-01 NOTE — Telephone Encounter (Signed)
Looks like she is being worked up for chronic cough Will refil in PCP absence Px written for call in

## 2011-09-01 NOTE — Telephone Encounter (Signed)
Ok to refill 

## 2011-09-01 NOTE — Telephone Encounter (Signed)
It looks like this was just done 7/4 ?

## 2011-09-01 NOTE — Telephone Encounter (Signed)
Called in Rx as directed.  

## 2011-09-05 ENCOUNTER — Ambulatory Visit (INDEPENDENT_AMBULATORY_CARE_PROVIDER_SITE_OTHER): Payer: Federal, State, Local not specified - PPO | Admitting: Cardiovascular Disease

## 2011-09-05 ENCOUNTER — Encounter: Payer: Self-pay | Admitting: Cardiovascular Disease

## 2011-09-05 VITALS — BP 164/98 | HR 79 | Ht 64.0 in | Wt 323.0 lb

## 2011-09-05 DIAGNOSIS — E669 Obesity, unspecified: Secondary | ICD-10-CM

## 2011-09-05 DIAGNOSIS — I4891 Unspecified atrial fibrillation: Secondary | ICD-10-CM

## 2011-09-05 DIAGNOSIS — I10 Essential (primary) hypertension: Secondary | ICD-10-CM

## 2011-09-05 DIAGNOSIS — I2510 Atherosclerotic heart disease of native coronary artery without angina pectoris: Secondary | ICD-10-CM

## 2011-09-05 DIAGNOSIS — R05 Cough: Secondary | ICD-10-CM

## 2011-09-05 MED ORDER — LOSARTAN POTASSIUM 100 MG PO TABS: 100.0000 mg | ORAL_TABLET | Freq: Every day | ORAL | Status: DC

## 2011-09-05 NOTE — Assessment & Plan Note (Signed)
Maintaining normal sinus rhythm. She is interested in pradaxa or Xarelto. It is cheaper for her to do this then warfarin would lab draws. We have given her a prescription for xarelto 20 mg daily. She'll start this when her warfarin runs out.

## 2011-09-05 NOTE — Progress Notes (Signed)
Patient ID: Tina Pacheco, female    DOB: 15-Oct-1950, 61 y.o.   MRN: [id number]  HPI Comments: Tina Pacheco is a 61 year old woman with morbid obesity,  who was  admitted to  [facility] on May 23 2010  for paroxysmal atrial fibrillation, echocardiogram showing normal LV unction with diastolic dysfunction, also with hyperlipidemia,  hypertension, cardiac catheterization March 27 showing mild nonobstructive coronary artery disease,  history of asthma/COPD, suspected sleep apnea, rheumatoid arthritis who presents for routine followup.   She has not been taking metolazone. She rarely takes torsemide. She does take potassium daily for leg cramps. She was previously on an ACE inhibitor, lisinopril which was discontinued for cough. She has continued to have a cough despite stopping the ACE inhibitor. She has some lower extremity edema, not bad. She continues to have problem with her knees. Her gait is poor. She denies any tachycardia palpitations. No report of atrial fibrillation . She has chronic bilateral shoulder pain.  Last episodes of atrial fibrillation were March and July 2012.   EKG shows normal sinus rhythm with rate of 79 beats per minute with no significant ST or T wave changes    Outpatient Encounter Prescriptions as of 09/05/2011  Medication Sig Dispense Refill  . benzonatate (TESSALON) 200 MG capsule Take 200 mg by mouth 2 (two) times daily as needed.      . chlorpheniramine-HYDROcodone (TUSSIONEX) 10-8 MG/5ML LQCR take 1 teaspoonful by mouth every 12 hours if needed  140 mL  0  . colchicine 0.6 MG tablet Take 2 at onset of gout symptoms, then take one an hour later.      . fexofenadine (ALLEGRA) 180 MG tablet Take 180 mg by mouth daily.      . fluticasone (FLONASE) 50 MCG/ACT nasal spray Place 2 sprays into the nose daily.  16 g  6  . hydrochlorothiazide (HYDRODIURIL) 25 MG tablet Take 1 tablet (25 mg total) by mouth daily.  90 tablet  3  . hydrocodone-acetaminophen (LORCET-HD) 5-500  MG per capsule Take 1 capsule by mouth every 8 (eight) hours as needed.  270 capsule  3  . methotrexate (RHEUMATREX) 2.5 MG tablet Take 2.5 mg by mouth once a week. Caution:Chemotherapy. Protect from light. Take six tablets weekly.       . metoprolol succinate (TOPROL-XL) 50 MG 24 hr tablet Take 1 tablet (50 mg total) by mouth daily.  30 tablet  6  . montelukast (SINGULAIR) 10 MG tablet Take 1 tablet (10 mg total) by mouth at bedtime.  90 tablet  3  . Multiple Vitamin (MULTIVITAMIN) tablet Take 1 tablet by mouth daily.        . Omega-3 Fatty Acids (FISH OIL) 1000 MG CAPS 1 dose over 46 hours.        Marland Kitchen oxybutynin (DITROPAN-XL) 10 MG 24 hr tablet Take 10 mg by mouth daily.        . potassium chloride SA (K-DUR,KLOR-CON) 20 MEQ tablet Take one tablet daily with an extra dose if taking metolazone.  60 tablet  3  . rosuvastatin (CRESTOR) 10 MG tablet Take 1 tablet (10 mg total) by mouth daily.  90 tablet  3  . Specialty Vitamins Products (MAGNESIUM, AMINO ACID CHELATE,) 133 MG tablet Take 1 tablet by mouth once.      . SYMBICORT 160-4.5 MCG/ACT inhaler Inhale 2 puffs into the lungs Once daily as needed.      . terazosin (HYTRIN) 10 MG capsule Take 10 mg by mouth at  bedtime.        . torsemide (DEMADEX) 20 MG tablet Take 1 tablet (20 mg total) by mouth daily.  90 tablet  3  . warfarin (COUMADIN) 5 MG tablet Take 1 tablet (5 mg total) by mouth daily. May increase to 2 a day  180 tablet  3  . zolpidem (AMBIEN) 10 MG tablet Take 1 tablet (10 mg total) by mouth at bedtime as needed.  30 tablet  1   Review of Systems  Constitutional: Negative.   HENT: Negative.   Eyes: Negative.   Cardiovascular: Positive for leg swelling.  Gastrointestinal: Negative.   Musculoskeletal: Positive for arthralgias and gait problem.  Skin: Negative.   Neurological: Negative.   Hematological: Negative.   Psychiatric/Behavioral: Negative.   All other systems reviewed and are negative.    BP 164/98  Pulse 79  Ht 5\' 4"   (1.626 m)  Wt 323 lb (146.512 kg)  BMI 55.44 kg/m2  Physical Exam  Nursing note and vitals reviewed. Constitutional: She is oriented to person, place, and time. She appears well-developed and well-nourished.       obese  HENT:  Head: Normocephalic.  Nose: Nose normal.  Mouth/Throat: Oropharynx is clear and moist.  Eyes: Conjunctivae are normal. Pupils are equal, round, and reactive to light.  Neck: Normal range of motion. Neck supple. No JVD present.  Cardiovascular: Normal rate, regular rhythm, S1 normal, S2 normal, normal heart sounds and intact distal pulses.  Exam reveals no gallop and no friction rub.   No murmur heard. Pulmonary/Chest: Effort normal and breath sounds normal. No respiratory distress. She has no wheezes. She has no rales. She exhibits no tenderness.  Abdominal: Soft. Bowel sounds are normal. She exhibits no distension. There is no tenderness.  Musculoskeletal: Normal range of motion. She exhibits no edema and no tenderness.  Lymphadenopathy:    She has no cervical adenopathy.  Neurological: She is alert and oriented to person, place, and time. Coordination normal.  Skin: Skin is warm and dry. No rash noted. No erythema.  Psychiatric: She has a normal mood and affect. Her behavior is normal. Judgment and thought content normal.         Assessment and Plan

## 2011-09-05 NOTE — Assessment & Plan Note (Signed)
Currently with no symptoms of angina. No further workup at this time. Continue current medication regimen. 

## 2011-09-05 NOTE — Assessment & Plan Note (Signed)
Continued cough despite discontinuation of ACE inhibitor. Unable to definitively exclude mild diastolic CHF. No significant edema noted on exam. We have suggested she try several doses of torsemide to see if this would improve her cough symptoms. This would confirm diastolic CHF.

## 2011-09-05 NOTE — Assessment & Plan Note (Signed)
We have encouraged continued exercise, careful diet management in an effort to lose weight. 

## 2011-09-05 NOTE — Patient Instructions (Addendum)
You are doing well. Please start xarelto when you receive it in the  Mail (two days after warfarin has been stopped)  Please start losartan 1/2 pill for blood pressure if needed (if blood pressure high at home) Go up to full pill if needed for high numbers  Please call us if you have new issues that need to be addressed before your next appt.  Your physician wants you to follow-up in: 6 months.  You will receive a reminder letter in the mail two months in advance. If you don't receive a letter, please call our office to schedule the follow-up appointment.

## 2011-09-05 NOTE — Assessment & Plan Note (Signed)
Blood pressure is elevated. We have suggested she start losartan 50 mg daily, titrating up to 100 mg daily after one week if blood pressure continues to be high.

## 2011-09-08 NOTE — Telephone Encounter (Signed)
Rx already done

## 2011-09-13 ENCOUNTER — Ambulatory Visit (INDEPENDENT_AMBULATORY_CARE_PROVIDER_SITE_OTHER): Payer: Federal, State, Local not specified - PPO

## 2011-09-13 DIAGNOSIS — I4891 Unspecified atrial fibrillation: Secondary | ICD-10-CM

## 2011-09-13 DIAGNOSIS — Z7901 Long term (current) use of anticoagulants: Secondary | ICD-10-CM

## 2011-09-13 LAB — POCT INR: INR: 1.9

## 2011-09-14 ENCOUNTER — Other Ambulatory Visit: Payer: Self-pay | Admitting: Family Medicine

## 2011-09-15 ENCOUNTER — Other Ambulatory Visit: Payer: Self-pay

## 2011-09-15 MED ORDER — HYDROCOD POLST-CHLORPHEN POLST 10-8 MG/5ML PO LQCR: ORAL | Status: DC

## 2011-09-15 NOTE — Telephone Encounter (Signed)
Called in Rx as directed.  

## 2011-09-15 NOTE — Telephone Encounter (Signed)
Ok to refill 

## 2011-09-21 ENCOUNTER — Other Ambulatory Visit: Payer: Self-pay | Admitting: Family Medicine

## 2011-10-13 ENCOUNTER — Other Ambulatory Visit: Payer: Self-pay | Admitting: Cardiovascular Disease

## 2011-10-13 ENCOUNTER — Other Ambulatory Visit: Payer: Self-pay | Admitting: Family Medicine

## 2011-10-13 NOTE — Telephone Encounter (Signed)
Medicine called to pharmacy. 

## 2011-10-13 NOTE — Telephone Encounter (Signed)
Refilled Metoprolol

## 2011-10-27 ENCOUNTER — Other Ambulatory Visit: Payer: Self-pay | Admitting: Family Medicine

## 2011-10-28 ENCOUNTER — Other Ambulatory Visit: Payer: Self-pay | Admitting: Family Medicine

## 2011-10-30 NOTE — Telephone Encounter (Signed)
No narcotic cough suppressant refill from julyt... If same cough continuing needs to be seen. Can refill benzonatate.

## 2011-10-31 NOTE — Telephone Encounter (Signed)
Medicine called to pharmacy. 

## 2011-11-01 ENCOUNTER — Encounter: Payer: Self-pay | Admitting: Family Medicine

## 2011-11-01 ENCOUNTER — Telehealth: Payer: Self-pay

## 2011-11-01 ENCOUNTER — Ambulatory Visit (INDEPENDENT_AMBULATORY_CARE_PROVIDER_SITE_OTHER): Payer: Federal, State, Local not specified - PPO | Admitting: Family Medicine

## 2011-11-01 ENCOUNTER — Telehealth: Payer: Self-pay | Admitting: *Deleted

## 2011-11-01 VITALS — BP 138/70 | Temp 99.4°F | Wt 326.0 lb

## 2011-11-01 DIAGNOSIS — R05 Cough: Secondary | ICD-10-CM

## 2011-11-01 MED ORDER — HYDROCOD POLST-CHLORPHEN POLST 10-8 MG/5ML PO LQCR: ORAL | Status: DC

## 2011-11-01 MED ORDER — AZITHROMYCIN 250 MG PO TABS: ORAL_TABLET | ORAL | Status: DC

## 2011-11-01 NOTE — Telephone Encounter (Signed)
Pt seen today; pt got call from [facility] that z pack might interfere with Warfarin. Pt has taken Z pack before while taking warfarin without problem but pt wanted to know if OK to take z pack with warfarin or does pt need different antibiotic. Pt also left message for Dr Mariah Milling but has not heard from Dr Windell Hummingbird office.Please advise. [facility] [employer].

## 2011-11-01 NOTE — Telephone Encounter (Signed)
Yes all antiobiotics have the potential to change thickness of blood while on warfarin. Ok to take zpack. Keep appt.for her neck PT/INR check (please verify this will be within the next week to 10 days--after finishing zpack).

## 2011-11-01 NOTE — Telephone Encounter (Signed)
Returned call to pt, advised Azithromycin does have the potential to have a slight interaction with Coumadin and can increase pt's INR.  Pt states she called her primary MD as well and she recommended pt take the zpack and then have INR checked after she completes.  Made pt a follow-up appt in [facility] for 11/08/11 at 8:40.  Pt aware.

## 2011-11-01 NOTE — Telephone Encounter (Signed)
Pt says she never started Xarelto as prescribed. She remains on warfarin but has not had INR checked since 09/13/11. She was started on azithromycin and asks if ok to take with warfarin. I explained she probably needs to be seen for INR check, but I will forward to Cloyde Reams, RN in coumadin clinic to get further advice. I will call pt back.

## 2011-11-01 NOTE — Patient Instructions (Addendum)
It was great to see you. Take Zpack as directed. Hang in there. Please call me later this week with an update.

## 2011-11-01 NOTE — Progress Notes (Signed)
SUBJECTIVE:  Tina Pacheco is a 61 y.o. female who complains of coryza, congestion, sore throat, productive cough, myalgias and fever for 3 days. She denies a history of anorexia and chest pain and admits to a history of asthma. Patient denies smoke cigarettes.  Has h/o chronic cough, acutely exacerbated by this illness.   Patient Active Problem List  Diagnosis  . Atrial fibrillation  . Encounter for long-term (current) use of anticoagulants  . Obesity  . Hyperlipidemia  . HTN (hypertension)  . CAD (coronary artery disease)  . Diastolic dysfunction  . Rheumatoid arthritis  . Dizziness  . Allergic rhinitis  . Trapezius muscle spasm  . Chronic cough   Past Medical History  Diagnosis Date  . Hypertension   . Dyslipidemia   . Arrhythmia     paroxysmal atrial fibrillation  . CHF (congestive heart failure)     chronic diastolic  . Morbid obesity   . Hypokalemia   . Coronary artery disease     nonobstructive CAD  . Asthma   . Rheumatoid arthritis   . Basal cell carcinoma   . Hyperplastic colonic polyp 2003  . History of gout    Past Surgical History  Procedure Date  . Cardiac catheterization 05/24/2010    nonobstructive CAD  . Knee arthroscopy     bilateral  . Vaginal hysterectomy   . Cholecystectomy   . Cesarean section   . Colonoscopy 06/12/2011    Procedure: COLONOSCOPY;  Surgeon: Charna Elizabeth, MD;  Location: [facility];  Service: Endoscopy;  Laterality: N/A;   History  Substance Use Topics  . Smoking status: Never Smoker   . Smokeless tobacco: Never Used  . Alcohol Use: Yes     occasional-red wine   Family History  Problem Relation Age of Onset  . Tuberculosis Mother   . Parkinsonism Mother   . Diabetes type II Sister   . Breast cancer Sister   . Breast cancer Maternal Aunt    Allergies  Allergen Reactions  . Penicillins    Current Outpatient Prescriptions on File Prior to Visit  Medication Sig Dispense Refill  . benzonatate (TESSALON) 200 MG  capsule take 1 capsule by mouth twice a day if needed for cough  30 capsule  0  . colchicine 0.6 MG tablet Take 2 at onset of gout symptoms, then take one an hour later.      . fexofenadine (ALLEGRA) 180 MG tablet Take 180 mg by mouth daily.      . fluticasone (FLONASE) 50 MCG/ACT nasal spray Place 2 sprays into the nose daily.  16 g  6  . hydrochlorothiazide (HYDRODIURIL) 25 MG tablet Take 1 tablet (25 mg total) by mouth daily.  90 tablet  3  . hydrocodone-acetaminophen (LORCET-HD) 5-500 MG per capsule Take 1 capsule by mouth every 8 (eight) hours as needed.  270 capsule  3  . losartan (COZAAR) 100 MG tablet Take 1 tablet (100 mg total) by mouth daily.  30 tablet  6  . methotrexate (RHEUMATREX) 2.5 MG tablet Take 2.5 mg by mouth once a week. Caution:Chemotherapy. Protect from light. Take six tablets weekly.       . metoprolol succinate (TOPROL-XL) 50 MG 24 hr tablet take 1 tablet by mouth once daily  30 tablet  6  . montelukast (SINGULAIR) 10 MG tablet Take 1 tablet (10 mg total) by mouth at bedtime.  90 tablet  3  . Multiple Vitamin (MULTIVITAMIN) tablet Take 1 tablet by mouth daily.        Marland Kitchen  Omega-3 Fatty Acids (FISH OIL) 1000 MG CAPS 1 dose over 46 hours.        Marland Kitchen oxybutynin (DITROPAN-XL) 10 MG 24 hr tablet Take 10 mg by mouth daily.        . potassium chloride SA (K-DUR,KLOR-CON) 20 MEQ tablet Take one tablet daily with an extra dose if taking metolazone.  60 tablet  3  . Rivaroxaban (XARELTO) 20 MG TABS Take 1 tablet (20 mg total) by mouth daily.  90 tablet  6  . rosuvastatin (CRESTOR) 10 MG tablet Take 1 tablet (10 mg total) by mouth daily.  90 tablet  3  . Specialty Vitamins Products (MAGNESIUM, AMINO ACID CHELATE,) 133 MG tablet Take 1 tablet by mouth once.      . SYMBICORT 160-4.5 MCG/ACT inhaler Inhale 2 puffs into the lungs Once daily as needed.      . terazosin (HYTRIN) 10 MG capsule Take 10 mg by mouth at bedtime.        . torsemide (DEMADEX) 20 MG tablet Take 1 tablet (20 mg total)  by mouth daily.  90 tablet  3  . zolpidem (AMBIEN) 10 MG tablet take 1 tablet by mouth at bedtime if needed  30 tablet  1   The PMH, PSH, Social History, Family History, Medications, and allergies have been reviewed in [facility], and have been updated if relevant.  OBJECTIVE:  BP 138/70  Temp 99.4 F (37.4 C)  Wt 326 lb (147.873 kg)  She appears well, vital signs are as noted. Ears normal.  Throat and pharynx normal.  Neck supple. No adenopathy in the neck. Nose is congested. Sinuses tender. The chest is clear, without wheezes or rales.  ASSESSMENT:  bronchitis  PLAN: Given her history, will treat with Zpack. Symptomatic therapy suggested: push fluids, rest and return office visit prn if symptoms persist or worsen.  Call or return to clinic prn if these symptoms worsen or fail to improve as anticipated.

## 2011-11-01 NOTE — Telephone Encounter (Signed)
Patient mentioned that she is currently on Warfarin at the moment and is suppose to be switching to Xarelto but mentioned due to the cost she is still currently on Warfarin. Pt's doctor has requested her to take azithromycin 250 mg bid for bronchitis but the pharmacist wanted her to check with Dr. Mariah Milling because it may interfer with the Warfarin. Pt mentioned that she has taken azithromycin before while on warfarin and she didn't have any problems but she would like to know if it would be ok or what would you recommend a smaller dosage or if to even take azithromycin?

## 2011-11-01 NOTE — Telephone Encounter (Signed)
Advised patient as instructed.  She doesn't know when her next protime check is, she's waiting for cardiology office to call her back.

## 2011-11-06 ENCOUNTER — Other Ambulatory Visit: Payer: Self-pay | Admitting: Family Medicine

## 2011-11-06 NOTE — Telephone Encounter (Signed)
Pt is requesting refill on z-pack.  She says she's some better and says you told her she may need two rounds, and she has has yellow drainage, cough, body aches.

## 2011-11-06 NOTE — Telephone Encounter (Signed)
Advised pt that medicine has been sent to pharmacy.

## 2011-11-08 ENCOUNTER — Other Ambulatory Visit: Payer: Self-pay | Admitting: *Deleted

## 2011-11-08 ENCOUNTER — Ambulatory Visit (INDEPENDENT_AMBULATORY_CARE_PROVIDER_SITE_OTHER): Payer: Federal, State, Local not specified - PPO

## 2011-11-08 DIAGNOSIS — I4891 Unspecified atrial fibrillation: Secondary | ICD-10-CM

## 2011-11-08 DIAGNOSIS — Z7901 Long term (current) use of anticoagulants: Secondary | ICD-10-CM

## 2011-11-08 LAB — POCT INR: INR: 3.3

## 2011-11-08 MED ORDER — RIVAROXABAN 20 MG PO TABS: 20.0000 mg | ORAL_TABLET | Freq: Every day | ORAL | Status: DC

## 2011-11-08 NOTE — Telephone Encounter (Signed)
Refilled Xarelto. 

## 2011-11-15 ENCOUNTER — Ambulatory Visit (INDEPENDENT_AMBULATORY_CARE_PROVIDER_SITE_OTHER): Payer: Federal, State, Local not specified - PPO

## 2011-11-15 DIAGNOSIS — Z7901 Long term (current) use of anticoagulants: Secondary | ICD-10-CM

## 2011-11-15 DIAGNOSIS — I4891 Unspecified atrial fibrillation: Secondary | ICD-10-CM

## 2011-11-15 LAB — POCT INR: INR: 2.9

## 2011-11-16 ENCOUNTER — Ambulatory Visit (INDEPENDENT_AMBULATORY_CARE_PROVIDER_SITE_OTHER): Payer: Federal, State, Local not specified - PPO | Admitting: Family Medicine

## 2011-11-16 ENCOUNTER — Encounter: Payer: Self-pay | Admitting: Family Medicine

## 2011-11-16 VITALS — BP 140/80 | HR 84 | Temp 98.0°F | Wt 337.0 lb

## 2011-11-16 DIAGNOSIS — R05 Cough: Secondary | ICD-10-CM

## 2011-11-16 DIAGNOSIS — M25569 Pain in unspecified knee: Secondary | ICD-10-CM

## 2011-11-16 DIAGNOSIS — M25561 Pain in right knee: Secondary | ICD-10-CM | POA: Insufficient documentation

## 2011-11-16 NOTE — Patient Instructions (Addendum)
Good to see you. Please stop by to see Shirlee Limerick on your way out.  Try Opcon- A or other antihistamine for allergic conjunctivitis.

## 2011-11-16 NOTE — Progress Notes (Signed)
Patient ID: Tina Pacheco, female    DOB: 1950-11-30, 61 y.o.   MRN: [id number]  HPI Comments: Tina Pacheco is a 61 year old with complicated medical history, including  paroxysmal atrial fibrillation, echocardiogram showing normal LV unction with diastolic dysfunction, hyperlipidemia, obesity, hypertension, cardiac catheterization March 27 showing mild nonobstructive coronary artery disease also a history of asthma/COPD, suspected sleep apnea, rheumatoid arthritis here for:     1.  Chronic cough- has episodes of tickle in her throat that leads to terrible cough, often has post tussive emesis. Felt this was secondary to her allergic rhinitis- currently taking flonase, singulair, allegra and symbicort.  Tussionex help with cough for short period of time but cough has persisted. Not productive. No CP.  No SOB (other than when she is coughing). We stopped ACEI which has helped a little.  She has a cat which she is allergic to and she does not want to get rid off. Eyes have been itchy and watering.  CXR neg 08/2011.  2.  Right knee pain- has had numerous bilateral arthroscopic knee surgeries [facility]). Fell two weeks ago and twisted her right knee.  Due to her body habitus, she is unclear about swelling although she thinks there has been a little swelling. She is having right knee pain- mostly lateral knee, goes down her leg. Pain is worsened by lateral movements.   Outpatient Encounter Prescriptions as of 11/16/2011  Medication Sig Dispense Refill  . benzonatate (TESSALON) 200 MG capsule take 1 capsule by mouth twice a day if needed for cough  30 capsule  0  . chlorpheniramine-HYDROcodone (TUSSIONEX) 10-8 MG/5ML LQCR take 1 teaspoonful by mouth every 12 hours if needed  140 mL  0  . colchicine 0.6 MG tablet Take 2 at onset of gout symptoms, then take one an hour later.      . fexofenadine (ALLEGRA) 180 MG tablet Take 180 mg by mouth daily.      . fluticasone (FLONASE) 50 MCG/ACT nasal spray  Place 2 sprays into the nose daily.  16 g  6  . hydrochlorothiazide (HYDRODIURIL) 25 MG tablet Take 1 tablet (25 mg total) by mouth daily.  90 tablet  3  . hydrocodone-acetaminophen (LORCET-HD) 5-500 MG per capsule Take 1 capsule by mouth every 8 (eight) hours as needed.  270 capsule  3  . losartan (COZAAR) 100 MG tablet Take 1 tablet (100 mg total) by mouth daily.  30 tablet  6  . methotrexate (RHEUMATREX) 2.5 MG tablet Take 2.5 mg by mouth once a week. Caution:Chemotherapy. Protect from light. Take six tablets weekly.       . metoprolol succinate (TOPROL-XL) 50 MG 24 hr tablet take 1 tablet by mouth once daily  30 tablet  6  . montelukast (SINGULAIR) 10 MG tablet Take 1 tablet (10 mg total) by mouth at bedtime.  90 tablet  3  . Multiple Vitamin (MULTIVITAMIN) tablet Take 1 tablet by mouth daily.        . Omega-3 Fatty Acids (FISH OIL) 1000 MG CAPS 1 dose over 46 hours.        Marland Kitchen oxybutynin (DITROPAN-XL) 10 MG 24 hr tablet Take 10 mg by mouth daily.        . potassium chloride SA (K-DUR,KLOR-CON) 20 MEQ tablet Take one tablet daily with an extra dose if taking metolazone.  60 tablet  3  . Rivaroxaban (XARELTO) 20 MG TABS Take 1 tablet (20 mg total) by mouth daily.  90 tablet  6  .  rosuvastatin (CRESTOR) 10 MG tablet Take 1 tablet (10 mg total) by mouth daily.  90 tablet  3  . Specialty Vitamins Products (MAGNESIUM, AMINO ACID CHELATE,) 133 MG tablet Take 1 tablet by mouth once.      . SYMBICORT 160-4.5 MCG/ACT inhaler Inhale 2 puffs into the lungs Once daily as needed.      . terazosin (HYTRIN) 10 MG capsule Take 10 mg by mouth at bedtime.        . torsemide (DEMADEX) 20 MG tablet Take 1 tablet (20 mg total) by mouth daily.  90 tablet  3  . zolpidem (AMBIEN) 10 MG tablet take 1 tablet by mouth at bedtime if needed  30 tablet  1  . DISCONTD: azithromycin (ZITHROMAX) 250 MG tablet take 2 tablets by mouth on day 1 then 1 tablet on days 2 through 5  6 tablet  0     Review of Systems  See  HPI   Physical Exam  BP 140/80  Pulse 84  Temp 98 F (36.7 C)  Wt 337 lb (152.862 kg)  Constitutional: She is oriented to person, place, and time. She appears well-developed and well-nourished.       obese  HENT:  Head: Normocephalic.  Nose: Nose normal.  Eyes: Conjunctivae are normal. Pupils are equal, round, and reactive to light, no nyastagmus.  Neck: Normal range of motion. Neck supple. No JVD present.  Cardiovascular: Normal rate, regular rhythm, S1 normal, S2 normal, normal heart sounds and intact distal pulses.  Exam reveals no gallop and no friction rub.   No murmur heard. Pulmonary/Chest: Effort normal and breath sounds normal. No respiratory distress. She has no wheezes. She has no rales. She exhibits no tenderness.  Abdominal: Soft. Bowel sounds are normal. She exhibits no distension. There is no tenderness.  Musculoskeletal:  Difficult to assess swelling due to body habitus- she does have some pain to palpation over lateral joint line Lymphadenopathy:    She has no cervical adenopathy.  Neurological: She is alert and oriented to person, place, and time. Coordination normal.  Skin: Skin is warm and dry. No rash noted. No erythema.  Psychiatric: She has a normal mood and affect. Her behavior is normal. Judgment and thought content normal.    Assessment and Plan: 1. Right knee pain  New- given h/o previous surgeries and difficulty with exam due to body habitus, will refer to ortho. I suspect she may have a meniscal tear. The patient indicates understanding of these issues and agrees with the plan.  Ambulatory referral to Orthopedic Surgery  2. Chronic cough  Improved but persistent.  Now with watery, itchy eyes.  Seems allergic. Advised to eliminate allergens- ? Possible mold in house as well. Continue current meds, add Opcon for allergic conjunctivitis. The patient indicates understanding of these issues and agrees with the plan.

## 2011-11-17 ENCOUNTER — Other Ambulatory Visit: Payer: Self-pay | Admitting: Family Medicine

## 2011-11-17 NOTE — Telephone Encounter (Signed)
Medicine called to pharmacy. 

## 2011-11-28 ENCOUNTER — Ambulatory Visit: Payer: Self-pay | Admitting: Family Medicine

## 2011-11-28 ENCOUNTER — Encounter: Payer: Self-pay | Admitting: Family Medicine

## 2011-11-28 ENCOUNTER — Ambulatory Visit (INDEPENDENT_AMBULATORY_CARE_PROVIDER_SITE_OTHER): Payer: Federal, State, Local not specified - PPO | Admitting: Family Medicine

## 2011-11-28 VITALS — BP 128/82 | HR 84 | Temp 98.1°F | Wt 326.0 lb

## 2011-11-28 DIAGNOSIS — R1013 Epigastric pain: Secondary | ICD-10-CM

## 2011-11-28 LAB — COMPREHENSIVE METABOLIC PANEL
Albumin: 3.7 g/dL (ref 3.5–5.2)
CO2: 32 mEq/L (ref 19–32)
GFR: 66.62 mL/min (ref >60.00)
Glucose, Bld: 107 mg/dL — ABNORMAL HIGH (ref 70–99)
Potassium: 3.3 mEq/L — ABNORMAL LOW (ref 3.5–5.1)
Sodium: 137 mEq/L (ref 135–145)
Total Bilirubin: 1.3 mg/dL — ABNORMAL HIGH (ref 0.3–1.2)
Total Protein: 7.2 g/dL (ref 6.0–8.3)

## 2011-11-28 LAB — LIPASE: Lipase: 14 U/L (ref 11.0–59.0)

## 2011-11-28 NOTE — Patient Instructions (Addendum)
Try over the counter gas-x (four times a day when necessary) or beano for bloating.   Exclude gas producing foods (beans, onions, celery, carrots, raisins, bananas, apricots, prunes, brussel sprouts, wheat germ, pretzels)   If all labs and xray normal and the above measures do not help, we will refer to stomach doctor for an endoscopy.

## 2011-11-28 NOTE — Progress Notes (Signed)
Patient ID: Tina Pacheco, female    DOB: Sep 15, 1950, 61 y.o.   MRN: [id number]  HPI Comments: Tina Pacheco is a 61 year old with complicated medical history, including  paroxysmal atrial fibrillation, echocardiogram showing normal LV unction with diastolic dysfunction, hyperlipidemia, obesity, hypertension, cardiac catheterization March 27 showing mild nonobstructive coronary artery disease also a history of asthma/COPD, suspected sleep apnea, rheumatoid arthritis here for:  RUQ pain/epigatric pain with increased gassiness for past 3 weeks.  She did start Xarelto last week.  Has had these symptoms on and off for years but feels it has been occuring more frequently for past 3 weeks.  Pain starts in RUQ- radiated to epigastrium and back. Belching resolves the pain completely.  No nausea or vomiting. No diarrhea or blood in stool.  Not taking anything OTC but does make a solution of vinegar and baking soda that sometimes helps.  PMH significant for remote h/o cholecystectomy..   Outpatient Encounter Prescriptions as of 11/28/2011  Medication Sig Dispense Refill  . benzonatate (TESSALON) 200 MG capsule take 1 capsule by mouth twice a day if needed for cough  30 capsule  0  . chlorpheniramine-HYDROcodone (TUSSIONEX) 10-8 MG/5ML LQCR take 1 teaspoonful by mouth every 12 hours if needed  480 mL  0  . colchicine 0.6 MG tablet Take 2 at onset of gout symptoms, then take one an hour later.      . fexofenadine (ALLEGRA) 180 MG tablet Take 180 mg by mouth daily.      . fluticasone (FLONASE) 50 MCG/ACT nasal spray Place 2 sprays into the nose daily.  16 g  6  . hydrochlorothiazide (HYDRODIURIL) 25 MG tablet Take 1 tablet (25 mg total) by mouth daily.  90 tablet  3  . hydrocodone-acetaminophen (LORCET-HD) 5-500 MG per capsule Take 1 capsule by mouth every 8 (eight) hours as needed.  270 capsule  3  . losartan (COZAAR) 100 MG tablet Take 1 tablet (100 mg total) by mouth daily.  30 tablet  6  .  methotrexate (RHEUMATREX) 2.5 MG tablet Take 2.5 mg by mouth once a week. Caution:Chemotherapy. Protect from light. Take six tablets weekly.       . metoprolol succinate (TOPROL-XL) 50 MG 24 hr tablet take 1 tablet by mouth once daily  30 tablet  6  . montelukast (SINGULAIR) 10 MG tablet Take 1 tablet (10 mg total) by mouth at bedtime.  90 tablet  3  . Multiple Vitamin (MULTIVITAMIN) tablet Take 1 tablet by mouth daily.        . Omega-3 Fatty Acids (FISH OIL) 1000 MG CAPS 1 dose over 46 hours.        Marland Kitchen oxybutynin (DITROPAN-XL) 10 MG 24 hr tablet Take 10 mg by mouth daily.        . potassium chloride SA (K-DUR,KLOR-CON) 20 MEQ tablet Take one tablet daily with an extra dose if taking metolazone.  60 tablet  3  . Rivaroxaban (XARELTO) 20 MG TABS Take 1 tablet (20 mg total) by mouth daily.  90 tablet  6  . rosuvastatin (CRESTOR) 10 MG tablet Take 1 tablet (10 mg total) by mouth daily.  90 tablet  3  . Specialty Vitamins Products (MAGNESIUM, AMINO ACID CHELATE,) 133 MG tablet Take 1 tablet by mouth once.      . SYMBICORT 160-4.5 MCG/ACT inhaler Inhale 2 puffs into the lungs Once daily as needed.      . terazosin (HYTRIN) 10 MG capsule Take 10 mg by mouth at bedtime.        Marland Kitchen  torsemide (DEMADEX) 20 MG tablet Take 1 tablet (20 mg total) by mouth daily.  90 tablet  3  . zolpidem (AMBIEN) 10 MG tablet take 1 tablet by mouth at bedtime if needed  30 tablet  1     Review of Systems  See HPI   Physical Exam  BP 128/82  Pulse 84  Temp 98.1 F (36.7 C)  Wt 326 lb (147.873 kg)  Constitutional: She is oriented to person, place, and time. She appears well-developed and well-nourished.       obese  HENT:  Head: Normocephalic.  Nose: Nose normal.  Eyes: Conjunctivae are normal. Pupils are equal, round, and reactive to light, no nyastagmus.  Neck: Normal range of motion. Neck supple. No JVD present.  Cardiovascular: Normal rate, regular rhythm, S1 normal, S2 normal, normal heart sounds and intact  distal pulses.  Exam reveals no gallop and no friction rub.   No murmur heard. Pulmonary/Chest: Effort normal and breath sounds normal. No respiratory distress. She has no wheezes. She has no rales. She exhibits no tenderness.  Abdominal: Soft. Bowel sounds are normal. She exhibits no distension. There is no tenderness.  Musculoskeletal:  Lymphadenopathy:    She has no cervical adenopathy.  Neurological: She is alert and oriented to person, place, and time. Coordination normal.  Skin: Skin is warm and dry. No rash noted. No erythema.  Psychiatric: She has a normal mood and affect. Her behavior is normal. Judgment and thought content normal.    Assessment and Plan:    1. Epigastric pain  Comprehensive metabolic panel, Lipase, DG Abd 1 View  Deteriorated. ?worseneded by Xarelto. Exam benign and no red flag symptoms.  Upon questioning, she has been eating quite a bit of gas producing foods. Will check labs and KUB today. Supportive as care as discussed in patient instructions- including beano/gas x, eliminating gas producing foods. If no improvement in 2 weeks, refer to GI for endoscopy. The patient indicates understanding of these issues and agrees with the plan.

## 2011-12-07 ENCOUNTER — Other Ambulatory Visit: Payer: Self-pay | Admitting: *Deleted

## 2011-12-07 MED ORDER — OXYCODONE HCL 5 MG PO CAPS
5.0000 mg | ORAL_CAPSULE | Freq: Four times a day (QID) | ORAL | Status: DC | PRN
Start: 2011-12-07 — End: 2011-12-07

## 2011-12-07 MED ORDER — OXYCODONE HCL 5 MG PO CAPS: 5.0000 mg | ORAL_CAPSULE | Freq: Three times a day (TID) | ORAL | Status: DC | PRN

## 2011-12-07 NOTE — Telephone Encounter (Signed)
She didn't specifically ask for hydrocodone without tylenol, but she said she doesn't think she needs the tylenol.  She's asking for the lorcet, but in tablet form.  I didn't see her dose of lorcet in tablet form in [facility].

## 2011-12-07 NOTE — Telephone Encounter (Signed)
Oxycodone 5 mg is generic and should provide the same amount of relief as the narcotic part of what she is taking. Please print out as entered and place on my desk for signature.

## 2011-12-07 NOTE — Telephone Encounter (Signed)
Script printed, placed on your desk for signature.

## 2011-12-07 NOTE — Telephone Encounter (Signed)
Yes she can have 270 tablets.

## 2011-12-07 NOTE — Telephone Encounter (Signed)
Pt has been taking lorcet 5-500 capsules but she is asking for a comparable hydrocodone in a tablet.  She needs a new script sent to [facility].  She is asking for a 90 day supply.  Also, she is asking if the new otc overactive bladder patch, oxytrol, would work for her instead of the oxybutynin that she has been taking.  I told her that you may need to know what's in the patch before you can advise her.

## 2011-12-07 NOTE — Telephone Encounter (Signed)
Pt is agreeable to changing, but, will she be able to get a 90 day supply?

## 2011-12-07 NOTE — Telephone Encounter (Signed)
Is she asking for hydrocodone without tylenol? I'm not quite clear as to what she is asking for.  Yes, I would need to know what patch she is talking about.

## 2011-12-08 NOTE — Telephone Encounter (Signed)
Script given to patient

## 2011-12-16 ENCOUNTER — Encounter (HOSPITAL_COMMUNITY): Payer: Self-pay | Admitting: Emergency Medicine

## 2011-12-16 ENCOUNTER — Emergency Department (HOSPITAL_COMMUNITY)
Admission: EM | Admit: 2011-12-16 | Discharge: 2011-12-16 | Disposition: A | Discharge status: Home / Self Care | Payer: Federal, State, Local not specified - PPO | Attending: Emergency Medicine | Admitting: Emergency Medicine

## 2011-12-16 DIAGNOSIS — I2510 Atherosclerotic heart disease of native coronary artery without angina pectoris: Secondary | ICD-10-CM | POA: Insufficient documentation

## 2011-12-16 DIAGNOSIS — Z79899 Other long term (current) drug therapy: Secondary | ICD-10-CM | POA: Insufficient documentation

## 2011-12-16 DIAGNOSIS — I4891 Unspecified atrial fibrillation: Secondary | ICD-10-CM | POA: Insufficient documentation

## 2011-12-16 DIAGNOSIS — I10 Essential (primary) hypertension: Secondary | ICD-10-CM | POA: Insufficient documentation

## 2011-12-16 DIAGNOSIS — E785 Hyperlipidemia, unspecified: Secondary | ICD-10-CM | POA: Insufficient documentation

## 2011-12-16 DIAGNOSIS — R002 Palpitations: Secondary | ICD-10-CM | POA: Insufficient documentation

## 2011-12-16 DIAGNOSIS — I509 Heart failure, unspecified: Secondary | ICD-10-CM | POA: Insufficient documentation

## 2011-12-16 HISTORY — DX: Chronic obstructive pulmonary disease, unspecified: J44.9

## 2011-12-16 LAB — BASIC METABOLIC PANEL
BUN: 9 mg/dL (ref 6–23)
Chloride: 98 mEq/L (ref 96–112)
Creatinine, Ser: 0.83 mg/dL (ref 0.50–1.10)
GFR calc Af Amer: 86 mL/min — ABNORMAL LOW (ref >90)
GFR calc non Af Amer: 75 mL/min — ABNORMAL LOW (ref >90)
Potassium: 3.3 mEq/L — ABNORMAL LOW (ref 3.5–5.1)

## 2011-12-16 LAB — CBC
HCT: 36.3 % (ref 36.0–46.0)
MCHC: 32.8 g/dL (ref 30.0–36.0)
MCV: 88.8 fL (ref 78.0–100.0)
Platelets: 159 10*3/uL (ref 150–400)
RDW: 16.5 % — ABNORMAL HIGH (ref 11.5–15.5)
WBC: 5.4 10*3/uL (ref 4.0–10.5)

## 2011-12-16 LAB — TROPONIN I: Troponin I: <0.3 ng/mL (ref <0.30)

## 2011-12-16 MED ORDER — DILTIAZEM HCL 100 MG IV SOLR
5.0000 mg/h | Freq: Once | INTRAVENOUS | Status: AC
  Administered 2011-12-16: 5 mg/h via INTRAVENOUS
  Filled 2011-12-16 (×2): qty 100

## 2011-12-16 MED ORDER — DILTIAZEM HCL 100 MG IV SOLR: 5.0000 mg/h | INTRAVENOUS | Status: DC

## 2011-12-16 MED ORDER — POTASSIUM CHLORIDE CRYS ER 20 MEQ PO TBCR
40.0000 meq | EXTENDED_RELEASE_TABLET | Freq: Once | ORAL | Status: AC
  Administered 2011-12-16: 40 meq via ORAL
  Filled 2011-12-16: qty 2

## 2011-12-16 MED ORDER — DILTIAZEM HCL 25 MG/5ML IV SOLN
20.0000 mg | Freq: Once | INTRAVENOUS | Status: AC
  Administered 2011-12-16: 20 mg via INTRAVENOUS
  Filled 2011-12-16: qty 5

## 2011-12-16 NOTE — ED Notes (Signed)
Pt discharged to home with family. NAD. NSR on monitor. Patient voiced understanding of discharge instructions.

## 2011-12-16 NOTE — ED Provider Notes (Signed)
History     CSN: [id number]  Arrival date & time 12/16/11  9629   First MD Initiated Contact with Patient 12/16/11 [phone]      Chief complaint: palpitations   (Consider location/radiation/quality/duration/timing/severity/associated sxs/prior treatment) Patient is a 61 y.o. female presenting with palpitations. The history is provided by the patient.  Palpitations  Pertinent negatives include no fever, no chest pain, no abdominal pain, no headaches, no back pain and no shortness of breath.  pt with onset palpitations onset early this morning. States hx afib, feels same. Constant. Denies cp. No recent change in meds, states is compliant w meds. No sob. No nv. No diaphoresis. No fainting/syncope. Pt states recent health at baseline. w afib/palpitations, denies specific exacerbating or alleviating factors. No fever or chills.   Past Medical History  Diagnosis Date  . Hypertension   . Dyslipidemia   . Arrhythmia     paroxysmal atrial fibrillation  . CHF (congestive heart failure)     chronic diastolic  . Morbid obesity   . Hypokalemia   . Coronary artery disease     nonobstructive CAD  . Asthma   . Rheumatoid arthritis   . Basal cell carcinoma   . Hyperplastic colonic polyp 2003  . History of gout   . COPD (chronic obstructive pulmonary disease)     Past Surgical History  Procedure Date  . Cardiac catheterization 05/24/2010    nonobstructive CAD  . Knee arthroscopy     bilateral  . Vaginal hysterectomy   . Cholecystectomy   . Cesarean section   . Colonoscopy 06/12/2011    Procedure: COLONOSCOPY;  Surgeon: Charna Elizabeth, MD;  Location: [facility];  Service: Endoscopy;  Laterality: N/A;    Family History  Problem Relation Age of Onset  . Tuberculosis Mother   . Parkinsonism Mother   . Diabetes type II Sister   . Breast cancer Sister   . Breast cancer Maternal Aunt     History  Substance Use Topics  . Smoking status: Never Smoker   . Smokeless tobacco: Never Used    . Alcohol Use: Yes     occasional-red wine    OB History    Grav Para Term Preterm Abortions TAB SAB Ect Mult Living                  Review of Systems  Constitutional: Negative for fever and chills.  HENT: Negative for neck pain.   Eyes: Negative for redness.  Respiratory: Negative for shortness of breath.   Cardiovascular: Positive for palpitations. Negative for chest pain and leg swelling.  Gastrointestinal: Negative for abdominal pain.  Genitourinary: Negative for flank pain.  Musculoskeletal: Negative for back pain.  Skin: Negative for rash.  Neurological: Negative for headaches.  Hematological: Does not bruise/bleed easily.  Psychiatric/Behavioral: Negative for confusion.    Allergies  Penicillins  Home Medications   Current Outpatient Rx  Name Route Sig Dispense Refill  . BENZONATATE 200 MG PO CAPS  take 1 capsule by mouth twice a day if needed for cough 30 capsule 0  . HYDROCOD POLST-CPM POLST ER 10-8 MG/5ML PO LQCR  take 1 teaspoonful by mouth every 12 hours if needed 480 mL 0  . COLCHICINE 0.6 MG PO TABS  Take 2 at onset of gout symptoms, then take one an hour later.    Marland Kitchen FEXOFENADINE HCL 180 MG PO TABS Oral Take 180 mg by mouth daily.    Marland Kitchen FLUTICASONE PROPIONATE 50 MCG/ACT NA SUSP Nasal Place 2  sprays into the nose daily. 16 g 6  . HYDROCHLOROTHIAZIDE 25 MG PO TABS Oral Take 1 tablet (25 mg total) by mouth daily. 90 tablet 3  . LOSARTAN POTASSIUM 100 MG PO TABS Oral Take 1 tablet (100 mg total) by mouth daily. 30 tablet 6  . METHOTREXATE 2.5 MG PO TABS Oral Take 2.5 mg by mouth once a week. Caution:Chemotherapy. Protect from light. Take six tablets weekly.     Marland Kitchen METOPROLOL SUCCINATE ER 50 MG PO TB24  take 1 tablet by mouth once daily 30 tablet 6  . MONTELUKAST SODIUM 10 MG PO TABS Oral Take 1 tablet (10 mg total) by mouth at bedtime. 90 tablet 3  . ONE-DAILY MULTI VITAMINS PO TABS Oral Take 1 tablet by mouth daily.      Marland Kitchen FISH OIL 1000 MG PO CAPS  1 dose over  46 hours.      . OXYBUTYNIN CHLORIDE ER 10 MG PO TB24 Oral Take 10 mg by mouth daily.      . OXYCODONE HCL 5 MG PO CAPS Oral Take 1 capsule (5 mg total) by mouth every 8 (eight) hours as needed for pain. 270 capsule 0  . POTASSIUM CHLORIDE CRYS ER 20 MEQ PO TBCR  Take one tablet daily with an extra dose if taking metolazone. 60 tablet 3  . RIVAROXABAN 20 MG PO TABS Oral Take 1 tablet (20 mg total) by mouth daily. 90 tablet 6  . ROSUVASTATIN CALCIUM 10 MG PO TABS Oral Take 1 tablet (10 mg total) by mouth daily. 90 tablet 3  . MG-PLUS PROTEIN 133 MG PO TABS Oral Take 1 tablet by mouth once.    . SYMBICORT 160-4.5 MCG/ACT IN AERO Inhalation Inhale 2 puffs into the lungs Once daily as needed.    . TERAZOSIN HCL 10 MG PO CAPS Oral Take 10 mg by mouth at bedtime.      . TORSEMIDE 20 MG PO TABS Oral Take 1 tablet (20 mg total) by mouth daily. 90 tablet 3  . ZOLPIDEM TARTRATE 10 MG PO TABS  take 1 tablet by mouth at bedtime if needed 30 tablet 1    BP 128/83  Pulse 140  Temp 97.5 F (36.4 C) (Oral)  Resp 15  Ht 5' 4.5" (1.638 m)  Wt 317 lb (143.79 kg)  BMI 53.57 kg/m2  SpO2 100%  Physical Exam  Nursing note and vitals reviewed. Constitutional: She appears well-developed and well-nourished. No distress.  HENT:  Nose: Nose normal.  Mouth/Throat: Oropharynx is clear and moist.  Eyes: Conjunctivae normal are normal. No scleral icterus.  Neck: Neck supple. No tracheal deviation present. No thyromegaly present.  Cardiovascular: Normal rate, regular rhythm, normal heart sounds and intact distal pulses.   Pulmonary/Chest: Effort normal and breath sounds normal. No respiratory distress.  Abdominal: Soft. Normal appearance and bowel sounds are normal. She exhibits no distension. There is no tenderness.  Musculoskeletal: She exhibits no edema and no tenderness.  Neurological: She is alert.  Skin: Skin is warm and dry. No rash noted.  Psychiatric: She has a normal mood and affect.    ED Course   Procedures (including critical care time)   Labs Reviewed  CBC  BASIC METABOLIC PANEL  TROPONIN I   Results for orders placed during the hospital encounter of 12/16/11  CBC      Component Value Range   WBC 5.4  4.0 - 10.5 K/uL   RBC 4.09  3.87 - 5.11 MIL/uL   Hemoglobin 11.9 (*)  12.0 - 15.0 g/dL   HCT 16.1  09.6 - 04.5 %   MCV 88.8  78.0 - 100.0 fL   MCH 29.1  26.0 - 34.0 pg   MCHC 32.8  30.0 - 36.0 g/dL   RDW 40.9 (*) 81.1 - 91.4 %   Platelets 159  150 - 400 K/uL  BASIC METABOLIC PANEL      Component Value Range   Sodium 134 (*) 135 - 145 mEq/L   Potassium 3.3 (*) 3.5 - 5.1 mEq/L   Chloride 98  96 - 112 mEq/L   CO2 24  19 - 32 mEq/L   Glucose, Bld 113 (*) 70 - 99 mg/dL   BUN 9  6 - 23 mg/dL   Creatinine, Ser 7.82  0.50 - 1.10 mg/dL   Calcium 9.5  8.4 - 95.6 mg/dL   GFR calc non Af Amer 75 (*) >90 mL/min   GFR calc Af Amer 86 (*) >90 mL/min  TROPONIN I      Component Value Range   Troponin I <0.30  <0.30 ng/mL        MDM  Iv ns. o2 [place]. Labs. Ecg.  Reviewed nursing notes and prior charts for additional history.   cardizem bolus and gtt.     Date: 12/16/2011  Rate: 138  Rhythm: atrial fibrillation  QRS Axis: normal  Intervals: normal  ST/T Wave abnormalities: nonspecific ST/T changes  Conduction Disutrbances:none  Narrative Interpretation:   Old EKG Reviewed: changes noted  After cardizem bolus and gtt hr improved, still a fib.   Date: 12/16/2011  Rate: 101  Rhythm: atrial fibrillation  QRS Axis: normal  Intervals: normal  ST/T Wave abnormalities: nonspecific T wave changes  Conduction Disutrbances:none  Narrative Interpretation:   Old EKG Reviewed: changes noted  Hr improved from prior. Pt comfortable. No cp or sob.  CRITICAL CARE Performed by: Suzi Roots   Total critical care time: 35  Critical care time was exclusive of separately billable procedures and treating other patients.  Critical care was necessary to treat or prevent  imminent or life-threatening deterioration.  Critical care was time spent personally by me on the following activities: development of treatment plan with patient and/or surrogate as well as nursing, discussions with consultants, evaluation of patient's response to treatment, examination of patient, obtaining history from patient or surrogate, ordering and performing treatments and interventions, ordering and review of laboratory studies, ordering and review of radiographic studies, pulse oximetry and re-evaluation of patient's condition.   Noted in nsr on monitor, repeat ecg.   Date: 12/16/2011  Rate: 62  Rhythm: normal sinus rhythm  QRS Axis: normal  Intervals: normal  ST/T Wave abnormalities: normal  Conduction Disutrbances:none  Narrative Interpretation:   Old EKG Reviewed: changes noted   kcl mildly low, kcl po.   Recheck nsr, no cp, no sob.     Suzi Roots, MD 12/18/11 0001

## 2011-12-16 NOTE — ED Notes (Signed)
Pt presents to ED via EMS. Pt called EMS this am for chest pain. EMS gave nitro sl and dropped SBP to 80. PIV inserted and 500ns given. 10 L NRB placed on patient sats 100%, sats 95  On room air. Afib on monitor.

## 2011-12-18 ENCOUNTER — Telehealth: Payer: Self-pay | Admitting: Cardiovascular Disease

## 2011-12-18 NOTE — Telephone Encounter (Signed)
Pt says she was seen in [facility] ED this w/e for atrial fib with RVR. No meds were changed. She says she is feeling ok today but wants to see Dr. Mariah Milling in f/u since this is the "third time this has happened." Scheduled for 12/22/11.

## 2011-12-18 NOTE — Telephone Encounter (Signed)
Pt was seen at [facility] on Saturday for afib wants to know if she needs to see a physician here for f/u

## 2011-12-22 ENCOUNTER — Encounter: Payer: Self-pay | Admitting: Cardiovascular Disease

## 2011-12-22 ENCOUNTER — Ambulatory Visit (INDEPENDENT_AMBULATORY_CARE_PROVIDER_SITE_OTHER): Payer: Federal, State, Local not specified - PPO | Admitting: Cardiovascular Disease

## 2011-12-22 VITALS — BP 95/67 | HR 79 | Ht 64.5 in | Wt 320.5 lb

## 2011-12-22 DIAGNOSIS — R0789 Other chest pain: Secondary | ICD-10-CM

## 2011-12-22 DIAGNOSIS — R0602 Shortness of breath: Secondary | ICD-10-CM

## 2011-12-22 DIAGNOSIS — E669 Obesity, unspecified: Secondary | ICD-10-CM

## 2011-12-22 DIAGNOSIS — I5189 Other ill-defined heart diseases: Secondary | ICD-10-CM

## 2011-12-22 DIAGNOSIS — I10 Essential (primary) hypertension: Secondary | ICD-10-CM

## 2011-12-22 DIAGNOSIS — E785 Hyperlipidemia, unspecified: Secondary | ICD-10-CM

## 2011-12-22 DIAGNOSIS — I4891 Unspecified atrial fibrillation: Secondary | ICD-10-CM

## 2011-12-22 DIAGNOSIS — I2510 Atherosclerotic heart disease of native coronary artery without angina pectoris: Secondary | ICD-10-CM

## 2011-12-22 DIAGNOSIS — I519 Heart disease, unspecified: Secondary | ICD-10-CM

## 2011-12-22 MED ORDER — POTASSIUM CHLORIDE CRYS ER 20 MEQ PO TBCR: 20.0000 meq | EXTENDED_RELEASE_TABLET | Freq: Two times a day (BID) | ORAL | Status: DC

## 2011-12-22 NOTE — Assessment & Plan Note (Signed)
Minimal disease by her previous cardiac catheterization in 2012

## 2011-12-22 NOTE — Assessment & Plan Note (Signed)
We have encouraged continued exercise, careful diet management in an effort to lose weight. 

## 2011-12-22 NOTE — Assessment & Plan Note (Signed)
Cholesterol is at goal on the current lipid regimen. No changes to the medications were made.  

## 2011-12-22 NOTE — Patient Instructions (Addendum)
Check your blood pressures at home. If Blood pressure is low, cut the losartan in 1/2  Please take metoprolol before bed. If you continue to have atrial fib episodes, call the office. We may need anti-arrhythmic  Please take potassium 1 1/2 a day (total of 30 meq a day)  Please call us if you have new issues that need to be addressed before your next appt.  Your physician wants you to follow-up in: 6 months.  You will receive a reminder letter in the mail two months in advance. If you don't receive a letter, please call our office to schedule the follow-up appointment.

## 2011-12-22 NOTE — Progress Notes (Signed)
Patient ID: Tina Pacheco, female    DOB: December 07, 1950, 61 y.o.   MRN: [id number]  HPI Comments: Tina Pacheco is a 61 year old woman with morbid obesity,  who was  admitted to  [facility] on May 23 2010  for paroxysmal atrial fibrillation, echocardiogram showing normal LV unction with diastolic dysfunction, also with hyperlipidemia,  hypertension, cardiac catheterization May 24 2010 showing mild nonobstructive coronary artery disease,  history of asthma/COPD, suspected sleep apnea, rheumatoid arthritis who presents for routine followup.   She reports that she had an episode of atrial fibrillation 12/16/2011. She did not feel well after getting up to go to the bathroom early in the morning. She went back to bed and she had arm discomfort, tingling, felt strange. She called EMS and they gave her aspirin. She was taken to [facility] found to be in atrial fibrillation. She was started on diltiazem infusion and converted to normal sinus rhythm several hours later. Other workup was essentially normal and she was sent home. Last episode was one year ago. She does report significant abdominal gas after eating lobster pretzels  Family thinks that she has obstructive sleep apnea. She does not think that she can sleep with a CPAP. She feels that she takes her Toprol in the morning. All of her atrial fibrillation episodes have occurred first thing in the morning prior to medications. Last episodes of atrial fibrillation were March and July 2012.   EKG shows normal sinus rhythm with rate of 79 beats per minute with no significant ST or T wave changes    Outpatient Encounter Prescriptions as of 12/22/2011  Medication Sig Dispense Refill  . albuterol (PROVENTIL HFA;VENTOLIN HFA) 108 (90 BASE) MCG/ACT inhaler Inhale 2 puffs into the lungs every 6 (six) hours as needed. For shortness of breath       . benzonatate (TESSALON) 200 MG capsule Take 200 mg by mouth 2 (two) times daily as needed. For cough       .  budesonide-formoterol (SYMBICORT) 160-4.5 MCG/ACT inhaler Inhale 2 puffs into the lungs 2 (two) times daily as needed. For shortness of breath       . chlorpheniramine-HYDROcodone (TUSSIONEX) 10-8 MG/5ML LQCR Take 5 mLs by mouth every 12 (twelve) hours as needed. For cough       . fexofenadine (ALLEGRA) 180 MG tablet Take 180 mg by mouth daily.      . fluticasone (FLONASE) 50 MCG/ACT nasal spray Place 2 sprays into the nose daily as needed. For allergies       . hydrochlorothiazide (HYDRODIURIL) 25 MG tablet Take 1 tablet (25 mg total) by mouth daily.  90 tablet  3  . leflunomide (ARAVA) 20 MG tablet Take 20 mg by mouth daily.      Marland Kitchen losartan (COZAAR) 100 MG tablet Take 1 tablet (100 mg total) by mouth daily.  30 tablet  6  . methotrexate (RHEUMATREX) 2.5 MG tablet Take 15 mg by mouth once a week. Caution:Chemotherapy. Protect from light. Take six tablets weekly on saturdays      . metoprolol succinate (TOPROL-XL) 50 MG 24 hr tablet take 1 tablet by mouth once daily  30 tablet  6  . montelukast (SINGULAIR) 10 MG tablet Take 1 tablet (10 mg total) by mouth at bedtime.  90 tablet  3  . Multiple Vitamin (MULTIVITAMIN) tablet Take 1 tablet by mouth daily.        Marland Kitchen oxybutynin (DITROPAN-XL) 10 MG 24 hr tablet Take 10 mg by mouth daily.       Marland Kitchen  potassium chloride SA (K-DUR,KLOR-CON) 20 MEQ tablet Take 20 mEq by mouth 2 (two) times daily. May take extra tab when taking torsemide      . Rivaroxaban (XARELTO) 20 MG TABS Take 1 tablet (20 mg total) by mouth daily.  90 tablet  6  . rosuvastatin (CRESTOR) 10 MG tablet Take 1 tablet (10 mg total) by mouth daily.  90 tablet  3  . Specialty Vitamins Products (MAGNESIUM, AMINO ACID CHELATE,) 133 MG tablet Take 1 tablet by mouth at bedtime.       Marland Kitchen terazosin (HYTRIN) 10 MG capsule Take 10 mg by mouth at bedtime.        . torsemide (DEMADEX) 20 MG tablet Take 20 mg by mouth daily as needed. For fluid retention       . zolpidem (AMBIEN) 10 MG tablet Take 10 mg by  mouth at bedtime as needed. For sleep       . DISCONTD: oxycodone (OXY-IR) 5 MG capsule Take 1 capsule (5 mg total) by mouth every 8 (eight) hours as needed for pain.  270 capsule  0    Review of Systems  Constitutional: Negative.   HENT: Negative.   Eyes: Negative.   Gastrointestinal: Negative.   Musculoskeletal: Positive for arthralgias and gait problem.  Skin: Negative.   Neurological: Negative.   Hematological: Negative.   Psychiatric/Behavioral: Negative.   All other systems reviewed and are negative.    BP 95/67  Pulse 79  Ht 5' 4.5" (1.638 m)  Wt 320 lb 8 oz (145.378 kg)  BMI 54.16 kg/m2  Physical Exam  Nursing note and vitals reviewed. Constitutional: She is oriented to person, place, and time. She appears well-developed and well-nourished.       obese  HENT:  Head: Normocephalic.  Nose: Nose normal.  Mouth/Throat: Oropharynx is clear and moist.  Eyes: Conjunctivae normal are normal. Pupils are equal, round, and reactive to light.  Neck: Normal range of motion. Neck supple. No JVD present.  Cardiovascular: Normal rate, regular rhythm, S1 normal, S2 normal, normal heart sounds and intact distal pulses.  Exam reveals no gallop and no friction rub.   No murmur heard. Pulmonary/Chest: Effort normal and breath sounds normal. No respiratory distress. She has no wheezes. She has no rales. She exhibits no tenderness.  Abdominal: Soft. Bowel sounds are normal. She exhibits no distension. There is no tenderness.  Musculoskeletal: Normal range of motion. She exhibits no edema and no tenderness.  Lymphadenopathy:    She has no cervical adenopathy.  Neurological: She is alert and oriented to person, place, and time. Coordination normal.  Skin: Skin is warm and dry. No rash noted. No erythema.  Psychiatric: She has a normal mood and affect. Her behavior is normal. Judgment and thought content normal.         Assessment and Plan

## 2011-12-22 NOTE — Assessment & Plan Note (Signed)
We have suggested she take metoprolol in the morning. If symptoms recur, she may need antiarrhythmic such as flecainide. She is reluctant to do a sleep study.

## 2011-12-22 NOTE — Assessment & Plan Note (Addendum)
Blood pressure is low. We have suggested if this continues to run low at home, she decrease her losartan to 50 mg daily.

## 2011-12-22 NOTE — Assessment & Plan Note (Signed)
She is on diuretics for her diastolic CHF. Appears relatively euvolemic

## 2011-12-25 ENCOUNTER — Telehealth: Payer: Self-pay | Admitting: *Deleted

## 2011-12-25 NOTE — Telephone Encounter (Signed)
Pt states she couldn't get the oxycodone 5 mg script from 10/10 filled because [facility] told her that medicine has been discontinued.  They sent her the script back and told her she would need to get a new script for something else.  She wants a tablet without tylenol.  Is there something else she can get?  Also, she is asking if she needs to continue the hytrin.  If so, she needs needs new script for that sent to [facility], 90 day supply with refills.  She will also need a 90 day script for the oxycodone replacement.  She can bring the oxycodone script back in for shredding if she needs to.

## 2011-12-25 NOTE — Telephone Encounter (Signed)
Tina Pacheco, can you please call [facility].  Oxycodone has not been discontinued.

## 2011-12-26 NOTE — Telephone Encounter (Signed)
I called [facility] and was transferred to the prior [place] dept who said that as of today oxycodone does not need a prior auth but starting tomorrow it will.  I told them I didn't call for a PA and I told them what the patient had been told.  They said they didn't know why she was told that, but, at any rate the drug will need a prior auth as of tomorrow, but they couldn't send me one, I will have to call back tomorrow.  Also, please advise on whether pt needs to continue hytrin.

## 2011-12-27 MED ORDER — TERAZOSIN HCL 10 MG PO CAPS: 10.0000 mg | ORAL_CAPSULE | Freq: Every day | ORAL | Status: DC

## 2011-12-27 MED ORDER — OXYCODONE HCL 5 MG PO TABS: 5.0000 mg | ORAL_TABLET | Freq: Three times a day (TID) | ORAL | Status: DC | PRN

## 2011-12-27 NOTE — Telephone Encounter (Signed)
Rx printed

## 2011-12-27 NOTE — Telephone Encounter (Signed)
Hytrin sent to [facility], advised patient script for oxycodone is ready to pick up.  She will bring the old one in for shredding.

## 2011-12-27 NOTE — Telephone Encounter (Signed)
It looks like Dr. Mariah Milling did not advise to stop it when he saw her a few days ago.  Has her blood pressure been low?  If not, ok to send refills.

## 2011-12-27 NOTE — Telephone Encounter (Signed)
Pt needs new script for ocycodone tablets, she says [facility] is now telling her the capsules aren't available but the tablets are, without a prior auth.  She is asking for 3 month supply.  She has the old script if you want her to bring it it for shredding.

## 2011-12-31 ENCOUNTER — Other Ambulatory Visit: Payer: Self-pay | Admitting: Family Medicine

## 2012-01-01 NOTE — Telephone Encounter (Signed)
Medicine called to [facility]. 

## 2012-01-23 ENCOUNTER — Other Ambulatory Visit: Payer: Self-pay | Admitting: Family Medicine

## 2012-01-24 NOTE — Telephone Encounter (Signed)
Medicine called to [facility]. 

## 2012-03-03 ENCOUNTER — Other Ambulatory Visit: Payer: Self-pay | Admitting: Family Medicine

## 2012-03-04 NOTE — Telephone Encounter (Signed)
Tina Pacheco called to [facility].

## 2012-03-14 ENCOUNTER — Ambulatory Visit (INDEPENDENT_AMBULATORY_CARE_PROVIDER_SITE_OTHER): Payer: Federal, State, Local not specified - PPO | Admitting: Family Medicine

## 2012-03-14 ENCOUNTER — Encounter: Payer: Self-pay | Admitting: Family Medicine

## 2012-03-14 VITALS — BP 124/80 | HR 72 | Temp 97.9°F | Wt 318.0 lb

## 2012-03-14 DIAGNOSIS — L821 Other seborrheic keratosis: Secondary | ICD-10-CM

## 2012-03-14 DIAGNOSIS — R05 Cough: Secondary | ICD-10-CM

## 2012-03-14 MED ORDER — HYDROCOD POLST-CHLORPHEN POLST 10-8 MG/5ML PO LQCR: ORAL | Status: DC

## 2012-03-14 MED ORDER — BUDESONIDE-FORMOTEROL FUMARATE 160-4.5 MCG/ACT IN AERO: 2.0000 | INHALATION_SPRAY | Freq: Two times a day (BID) | RESPIRATORY_TRACT | Status: DC | PRN

## 2012-03-14 MED ORDER — AZITHROMYCIN 250 MG PO TABS: ORAL_TABLET | ORAL | Status: DC

## 2012-03-14 MED ORDER — ALBUTEROL SULFATE HFA 108 (90 BASE) MCG/ACT IN AERS: 2.0000 | INHALATION_SPRAY | Freq: Four times a day (QID) | RESPIRATORY_TRACT | Status: DC | PRN

## 2012-03-14 NOTE — Progress Notes (Signed)
SUBJECTIVE:  Tina Pacheco is a 62 y.o. female who complains of coryza, congestion, sore throat, productive cough, myalgias and fever for 3 days. She denies a history of anorexia and chest pain and admits to a history of asthma. Patient denies smoke cigarettes.  Has h/o chronic cough, acutely exacerbated by this illness.  She also has a "rough patch" on her lower back that is now bleeding and irritated.   Patient Active Problem List  Diagnosis  . Atrial fibrillation  . Encounter for long-term (current) use of anticoagulants  . Obesity  . Hyperlipidemia  . HTN (hypertension)  . CAD (coronary artery disease)  . Diastolic dysfunction  . Rheumatoid arthritis  . Dizziness  . Allergic rhinitis  . Trapezius muscle spasm  . Chronic cough  . Right knee pain  . Epigastric pain   Past Medical History  Diagnosis Date  . Hypertension   . Dyslipidemia   . Arrhythmia     paroxysmal atrial fibrillation  . CHF (congestive heart failure)     chronic diastolic  . Morbid obesity   . Hypokalemia   . Coronary artery disease     nonobstructive CAD  . Asthma   . Rheumatoid arthritis   . Basal cell carcinoma   . Hyperplastic colonic polyp 2003  . History of gout   . COPD (chronic obstructive pulmonary disease)    Past Surgical History  Procedure Date  . Cardiac catheterization 05/24/2010    nonobstructive CAD  . Knee arthroscopy     bilateral  . Vaginal hysterectomy   . Cholecystectomy   . Cesarean section   . Colonoscopy 06/12/2011    Procedure: COLONOSCOPY;  Surgeon: Charna Elizabeth, MD;  Location: [facility];  Service: Endoscopy;  Laterality: N/A;   History  Substance Use Topics  . Smoking status: Never Smoker   . Smokeless tobacco: Never Used  . Alcohol Use: Yes     Comment: occasional-red wine   Family History  Problem Relation Age of Onset  . Tuberculosis Mother   . Parkinsonism Mother   . Diabetes type II Sister   . Breast cancer Sister   . Breast cancer Maternal Aunt     Allergies  Allergen Reactions  . Penicillins     unknown   Current Outpatient Prescriptions on File Prior to Visit  Medication Sig Dispense Refill  . albuterol (PROVENTIL HFA;VENTOLIN HFA) 108 (90 BASE) MCG/ACT inhaler Inhale 2 puffs into the lungs every 6 (six) hours as needed. For shortness of breath  3 Inhaler  1  . fexofenadine (ALLEGRA) 180 MG tablet Take 180 mg by mouth daily.      . fluticasone (FLONASE) 50 MCG/ACT nasal spray Place 2 sprays into the nose daily as needed. For allergies       . hydrochlorothiazide (HYDRODIURIL) 25 MG tablet Take 1 tablet (25 mg total) by mouth daily.  90 tablet  3  . leflunomide (ARAVA) 20 MG tablet Take 20 mg by mouth daily.      Marland Kitchen losartan (COZAAR) 100 MG tablet Take 1 tablet (100 mg total) by mouth daily.  30 tablet  6  . methotrexate (RHEUMATREX) 2.5 MG tablet Take 15 mg by mouth once a week. Caution:Chemotherapy. Protect from light. Take six tablets weekly on saturdays      . metoprolol succinate (TOPROL-XL) 50 MG 24 hr tablet take 1 tablet by mouth once daily  30 tablet  6  . montelukast (SINGULAIR) 10 MG tablet Take 1 tablet (10 mg total) by mouth  at bedtime.  90 tablet  3  . Multiple Vitamin (MULTIVITAMIN) tablet Take 1 tablet by mouth daily.        Marland Kitchen oxybutynin (DITROPAN-XL) 10 MG 24 hr tablet Take 10 mg by mouth daily.       Marland Kitchen oxyCODONE (ROXICODONE) 5 MG immediate release tablet Take 1 tablet (5 mg total) by mouth every 8 (eight) hours as needed.  270 tablet  0  . potassium chloride SA (K-DUR,KLOR-CON) 20 MEQ tablet Take 1 tablet (20 mEq total) by mouth 2 (two) times daily. May take extra tab when taking torsemide  180 tablet  3  . Rivaroxaban (XARELTO) 20 MG TABS Take 1 tablet (20 mg total) by mouth daily.  90 tablet  6  . rosuvastatin (CRESTOR) 10 MG tablet Take 1 tablet (10 mg total) by mouth daily.  90 tablet  3  . Specialty Vitamins Products (MAGNESIUM, AMINO ACID CHELATE,) 133 MG tablet Take 1 tablet by mouth at bedtime.       Marland Kitchen  terazosin (HYTRIN) 10 MG capsule Take 1 capsule (10 mg total) by mouth at bedtime.  90 capsule  1  . torsemide (DEMADEX) 20 MG tablet Take 20 mg by mouth daily as needed. For fluid retention       . zolpidem (AMBIEN) 10 MG tablet take 1 tablet by mouth at bedtime if needed  30 tablet  1   The PMH, PSH, Social History, Family History, Medications, and allergies have been reviewed in [facility], and have been updated if relevant.  OBJECTIVE:  BP 124/80  Pulse 72  Temp 97.9 F (36.6 C)  Wt 318 lb (144.244 kg)  SpO2 97%  She appears well, vital signs are as noted. Ears normal.  Throat and pharynx normal.  Neck supple. No adenopathy in the neck. Nose is congested. Sinuses tender. The chest is clear, without wheezes or rales. Irritated SK on lower back- no drainage or warmth  ASSESSMENT/PLAN:  1.bronchitis- Given her history, will treat with Zpack. Symptomatic therapy suggested: push fluids, rest and return office visit prn if symptoms persist or worsen.  Call or return to clinic prn if these symptoms worsen or fail to improve as anticipated.  2.  Irritated skin lesion- Given her body habitus, will refer back to her established dermatologist for removal.  The patient indicates understanding of these issues and agrees with the plan.

## 2012-03-25 ENCOUNTER — Encounter: Payer: Self-pay | Admitting: Family Medicine

## 2012-04-07 ENCOUNTER — Other Ambulatory Visit: Payer: Self-pay | Admitting: Cardiovascular Disease

## 2012-04-08 ENCOUNTER — Other Ambulatory Visit: Payer: Self-pay | Admitting: *Deleted

## 2012-04-08 MED ORDER — LOSARTAN POTASSIUM 100 MG PO TABS: 100.0000 mg | ORAL_TABLET | Freq: Every day | ORAL | Status: DC

## 2012-04-08 NOTE — Telephone Encounter (Signed)
Refilled Losartan sent to [facility].

## 2012-04-29 ENCOUNTER — Encounter: Payer: Self-pay | Admitting: Family Medicine

## 2012-04-29 ENCOUNTER — Ambulatory Visit (INDEPENDENT_AMBULATORY_CARE_PROVIDER_SITE_OTHER): Payer: Federal, State, Local not specified - PPO | Admitting: Family Medicine

## 2012-04-29 VITALS — BP 110/82 | HR 84 | Temp 97.8°F | Wt 296.0 lb

## 2012-04-29 DIAGNOSIS — L989 Disorder of the skin and subcutaneous tissue, unspecified: Secondary | ICD-10-CM

## 2012-04-29 DIAGNOSIS — R040 Epistaxis: Secondary | ICD-10-CM

## 2012-04-29 MED ORDER — DOXYCYCLINE HYCLATE 100 MG PO TABS: 100.0000 mg | ORAL_TABLET | Freq: Two times a day (BID) | ORAL | Status: DC

## 2012-04-29 MED ORDER — HYDROCOD POLST-CHLORPHEN POLST 10-8 MG/5ML PO LQCR: ORAL | Status: DC

## 2012-04-29 NOTE — Patient Instructions (Addendum)
Good to see you.Let's try a course of doxcycline - 1 tablet twice daily for 10 days.     Nosebleed Nosebleeds can be caused by many conditions including trauma, infections, polyps, foreign bodies, dry mucous membranes or climate, medications and air conditioning. Most nosebleeds occur in the front of the nose. It is because of this location that most nosebleeds can be controlled by pinching the nostrils gently and continuously. Do this for at least 10 to 20 minutes. The reason for this long continuous pressure is that you must hold it long enough for the blood to clot. If during that 10 to 20 minute time period, pressure is released, the process may have to be started again. The nosebleed may stop by itself, quit with pressure, need concentrated heating (cautery) or stop with pressure from packing. HOME CARE INSTRUCTIONS   If your nose was packed, try to maintain the pack inside until your caregiver removes it. If a gauze pack was used and it starts to fall out, gently replace or cut the end off. Do not cut if a balloon catheter was used to pack the nose. Otherwise, do not remove unless instructed.  Avoid blowing your nose for 12 hours after treatment. This could dislodge the pack or clot and start bleeding again.  If the bleeding starts again, sit up and bending forward, gently pinch the front half of your nose continuously for 20 minutes.  If bleeding was caused by dry mucous membranes, cover the inside of your nose every morning with a petroleum or antibiotic ointment. Use your little fingertip as an applicator. Do this as needed during dry weather. This will keep the mucous membranes moist and allow them to heal.  Maintain humidity in your home by using less air conditioning or using a humidifier.  Do not use aspirin or medications which make bleeding more likely. Your caregiver can give you recommendations on this.  Resume normal activities as able but try to avoid straining, lifting or  bending at the waist for several days.  If the nosebleeds become recurrent and the cause is unknown, your caregiver may suggest laboratory tests. SEEK IMMEDIATE MEDICAL CARE IF:   Bleeding recurs and cannot be controlled.  There is unusual bleeding from or bruising on other parts of the body.  You have a fever.  Nosebleeds continue.  There is any worsening of the condition which originally brought you in.  You become lightheaded, feel faint, become sweaty or vomit blood. MAKE SURE YOU:   Understand these instructions.  Will watch your condition.  Will get help right away if you are not doing well or get worse. Document Released: 11/23/2004 Document Revised: 05/08/2011 Document Reviewed: 01/15/2009 [facility] Patient Information 2013 [place], [place].

## 2012-04-29 NOTE — Progress Notes (Signed)
Subjective:    Patient ID: Tina Pacheco, female    DOB: 10/16/1950, 62 y.o.   MRN: [id number]  HPI  Very pleasant 62 yo female here for:  1.  Irritated biopsy site- Had SK removed and biopsied by [facility] Stall 1 month ago.  Has been painful and irritated since.  She is not sure if there has been any drainage or erythema given the location on her lower back.  2.  Nose bleeds- has had recurrent nose bleeds from left side of nose last 2 weeks.  Episodes do not last longer than minutes. She is on Xarelto.  No blood in stool or urine that she is aware of.  Patient Active Problem List  Diagnosis  . Atrial fibrillation  . Encounter for long-term (current) use of anticoagulants  . Obesity  . Hyperlipidemia  . HTN (hypertension)  . CAD (coronary artery disease)  . Diastolic dysfunction  . Rheumatoid arthritis  . Dizziness  . Allergic rhinitis  . Trapezius muscle spasm  . Chronic cough  . Right knee pain  . Epigastric pain  . Epistaxis  . Skin lesion   Past Medical History  Diagnosis Date  . Hypertension   . Dyslipidemia   . Arrhythmia     paroxysmal atrial fibrillation  . CHF (congestive heart failure)     chronic diastolic  . Morbid obesity   . Hypokalemia   . Coronary artery disease     nonobstructive CAD  . Asthma   . Rheumatoid arthritis   . Basal cell carcinoma   . Hyperplastic colonic polyp 2003  . History of gout   . COPD (chronic obstructive pulmonary disease)    Past Surgical History  Procedure Laterality Date  . Cardiac catheterization  05/24/2010    nonobstructive CAD  . Knee arthroscopy      bilateral  . Vaginal hysterectomy    . Cholecystectomy    . Cesarean section    . Colonoscopy  06/12/2011    Procedure: COLONOSCOPY;  Surgeon: Charna Elizabeth, MD;  Location: [facility];  Service: Endoscopy;  Laterality: N/A;   History  Substance Use Topics  . Smoking status: Never Smoker   . Smokeless tobacco: Never Used  . Alcohol Use: Yes     Comment:  occasional-red wine   Family History  Problem Relation Age of Onset  . Tuberculosis Mother   . Parkinsonism Mother   . Diabetes type II Sister   . Breast cancer Sister   . Breast cancer Maternal Aunt    Allergies  Allergen Reactions  . Penicillins     unknown   Current Outpatient Prescriptions on File Prior to Visit  Medication Sig Dispense Refill  . albuterol (PROVENTIL HFA;VENTOLIN HFA) 108 (90 BASE) MCG/ACT inhaler Inhale 2 puffs into the lungs every 6 (six) hours as needed. For shortness of breath  3 Inhaler  1  . budesonide-formoterol (SYMBICORT) 160-4.5 MCG/ACT inhaler Inhale 2 puffs into the lungs 2 (two) times daily as needed. For shortness of breath  3 Inhaler  1  . fexofenadine (ALLEGRA) 180 MG tablet Take 180 mg by mouth daily.      . fluticasone (FLONASE) 50 MCG/ACT nasal spray Place 2 sprays into the nose daily as needed. For allergies       . hydrochlorothiazide (HYDRODIURIL) 25 MG tablet Take 1 tablet (25 mg total) by mouth daily.  90 tablet  3  . leflunomide (ARAVA) 20 MG tablet Take 20 mg by mouth daily.      Marland Kitchen  losartan (COZAAR) 100 MG tablet Take 1 tablet (100 mg total) by mouth daily.  30 tablet  6  . methotrexate (RHEUMATREX) 2.5 MG tablet Take 15 mg by mouth once a week. Caution:Chemotherapy. Protect from light. Take six tablets weekly on saturdays      . metoprolol succinate (TOPROL-XL) 50 MG 24 hr tablet take 1 tablet by mouth once daily  30 tablet  6  . montelukast (SINGULAIR) 10 MG tablet Take 1 tablet (10 mg total) by mouth at bedtime.  90 tablet  3  . Multiple Vitamin (MULTIVITAMIN) tablet Take 1 tablet by mouth daily.        Marland Kitchen oxybutynin (DITROPAN-XL) 10 MG 24 hr tablet Take 10 mg by mouth daily.       Marland Kitchen oxyCODONE (ROXICODONE) 5 MG immediate release tablet Take 1 tablet (5 mg total) by mouth every 8 (eight) hours as needed.  270 tablet  0  . potassium chloride SA (K-DUR,KLOR-CON) 20 MEQ tablet Take 1 tablet (20 mEq total) by mouth 2 (two) times daily. May  take extra tab when taking torsemide  180 tablet  3  . Rivaroxaban (XARELTO) 20 MG TABS Take 1 tablet (20 mg total) by mouth daily.  90 tablet  6  . rosuvastatin (CRESTOR) 10 MG tablet Take 1 tablet (10 mg total) by mouth daily.  90 tablet  3  . Specialty Vitamins Products (MAGNESIUM, AMINO ACID CHELATE,) 133 MG tablet Take 1 tablet by mouth at bedtime.       Marland Kitchen terazosin (HYTRIN) 10 MG capsule Take 1 capsule (10 mg total) by mouth at bedtime.  90 capsule  1  . torsemide (DEMADEX) 20 MG tablet Take 20 mg by mouth daily as needed. For fluid retention       . zolpidem (AMBIEN) 10 MG tablet take 1 tablet by mouth at bedtime if needed  30 tablet  1   No current facility-administered medications on file prior to visit.   The PMH, PSH, Social History, Family History, Medications, and allergies have been reviewed in [facility], and have been updated if relevant.   Review of Systems  See HPI     Objective:   Physical Exam BP 110/82  Pulse 84  Temp(Src) 97.8 F (36.6 C)  Wt 296 lb (134.265 kg)  BMI 50.04 kg/m2  General:  Well-developed,well-nourished,in no acute distress; alert,appropriate and cooperative throughout examination Head:  normocephalic and atraumatic.   Eyes:  vision grossly intact, pupils equal, pupils round, and pupils reactive to light.   Ears:  R ear normal and L ear normal.   Nose:  no external deformity.   +surface visible blood vessel left nare Mouth:  good dentition.   Lungs:  Normal respiratory effort, chest expands symmetrically. Lungs are clear to auscultation, no crackles or wheezes. Heart:  Normal rate and regular rhythm. S1 and S2 normal without gallop, murmur, click, rub or other extra sounds. Abdomen:  Bowel sounds positive,abdomen soft and non-tender without masses, organomegaly or hernias noted. Msk:  No deformity or scoliosis noted of thoracic or lumbar spine.   Extremities:  No clubbing, cyanosis, edema, or deformity noted with normal full range of motion of all  joints.   Neurologic:  alert & oriented X3 and gait normal.   Skin:   Circular one cm area on lower back with central scabbing, no drainage, does have some warmth with surrounding erythema Cervical Nodes:  No lymphadenopathy noted Axillary Nodes:  No palpable lymphadenopathy Psych:  Cognition and judgment appear intact. Alert  and cooperative with normal attention span and concentration. No apparent delusions, illusions, hallucinations     Assessment & Plan:  1. Epistaxis Recurrent- advised using humidifier and saline rinses.  If worsens, will need cautery. The patient indicates understanding of these issues and agrees with the plan.   2. Skin lesion Irritated SK biopsy site- likely infected given duration of symptoms. Will treat with 10 day course of doxycyline.  Try to keep area clean. The patient indicates understanding of these issues and agrees with the plan.

## 2012-05-02 ENCOUNTER — Other Ambulatory Visit: Payer: Self-pay | Admitting: Family Medicine

## 2012-05-06 NOTE — Telephone Encounter (Signed)
Medicine called to [facility]. 

## 2012-05-08 ENCOUNTER — Telehealth: Payer: Self-pay | Admitting: *Deleted

## 2012-05-08 ENCOUNTER — Other Ambulatory Visit: Payer: Self-pay | Admitting: *Deleted

## 2012-05-08 MED ORDER — OXYBUTYNIN CHLORIDE ER 10 MG PO TB24: 10.0000 mg | ORAL_TABLET | Freq: Every day | ORAL | Status: DC

## 2012-05-08 NOTE — Telephone Encounter (Signed)
At this point, I would recommend going back to see the dermatologist who did that biopsy that is now sore since it is not responding to antibiotics.  Please keep [place] posted.

## 2012-05-08 NOTE — Telephone Encounter (Signed)
Pt has been taking doxycycline for the sore area on her back, but it's not any better.  She's almost finished with the antibiotic..  Skin feels like it's burning.  She's unable to sleep at night, has to use ice on it.  Please advise on what patient should do.

## 2012-05-09 ENCOUNTER — Other Ambulatory Visit: Payer: Self-pay | Admitting: Cardiovascular Disease

## 2012-05-09 NOTE — Telephone Encounter (Signed)
Spoke with patient and advised results, she will call dermatologist for an appt

## 2012-05-10 ENCOUNTER — Other Ambulatory Visit: Payer: Self-pay | Admitting: *Deleted

## 2012-05-10 MED ORDER — METOPROLOL SUCCINATE ER 50 MG PO TB24: ORAL_TABLET | ORAL | Status: DC

## 2012-05-10 NOTE — Telephone Encounter (Signed)
Refilled Metoprolol sent to [facility].

## 2012-05-13 ENCOUNTER — Other Ambulatory Visit: Payer: Self-pay | Admitting: *Deleted

## 2012-05-13 MED ORDER — OXYCODONE HCL 5 MG PO TABS: 5.0000 mg | ORAL_TABLET | Freq: Three times a day (TID) | ORAL | Status: DC | PRN

## 2012-05-13 NOTE — Telephone Encounter (Signed)
Yes ok to refill and print out.

## 2012-05-13 NOTE — Telephone Encounter (Signed)
Pt is asking for refill on oxycodone, to pick up tomorrow.  OK to print out refill?

## 2012-05-13 NOTE — Telephone Encounter (Signed)
On your desk for signature

## 2012-05-14 NOTE — Telephone Encounter (Signed)
Advised patient script is ready for pick up, script placed at front desk. 

## 2012-05-21 ENCOUNTER — Other Ambulatory Visit: Payer: Self-pay | Admitting: *Deleted

## 2012-05-21 MED ORDER — MONTELUKAST SODIUM 10 MG PO TABS: 10.0000 mg | ORAL_TABLET | Freq: Every day | ORAL | Status: DC

## 2012-05-24 ENCOUNTER — Telehealth: Payer: Self-pay | Admitting: *Deleted

## 2012-05-24 NOTE — Telephone Encounter (Signed)
Remus Loffler called to [facility], script ok'd on 05/02/12 was never called to pharmacy.

## 2012-05-27 ENCOUNTER — Telehealth: Payer: Self-pay | Admitting: *Deleted

## 2012-05-27 NOTE — Telephone Encounter (Signed)
Form faxed

## 2012-05-27 NOTE — Telephone Encounter (Signed)
In my box

## 2012-05-27 NOTE — Telephone Encounter (Signed)
Prior Berkley Harvey is needed for Tina Pacheco, form is on your desk.

## 2012-05-28 ENCOUNTER — Other Ambulatory Visit: Payer: Self-pay | Admitting: Family Medicine

## 2012-05-29 NOTE — Telephone Encounter (Signed)
Prior auth given for [employer].  Advised pharmacist.  Approval letter placed on doctor's desk for signature and scanning.

## 2012-06-20 ENCOUNTER — Ambulatory Visit (INDEPENDENT_AMBULATORY_CARE_PROVIDER_SITE_OTHER): Payer: Federal, State, Local not specified - PPO | Admitting: Family Medicine

## 2012-06-20 ENCOUNTER — Encounter: Payer: Self-pay | Admitting: Family Medicine

## 2012-06-20 VITALS — BP 110/72 | HR 80 | Temp 97.7°F | Wt 309.0 lb

## 2012-06-20 DIAGNOSIS — L989 Disorder of the skin and subcutaneous tissue, unspecified: Secondary | ICD-10-CM

## 2012-06-20 DIAGNOSIS — R040 Epistaxis: Secondary | ICD-10-CM

## 2012-06-20 MED ORDER — LIDOCAINE 5 % EX PTCH: 1.0000 | MEDICATED_PATCH | CUTANEOUS | Status: DC

## 2012-06-20 MED ORDER — NYSTATIN 100000 UNIT/GM EX POWD: CUTANEOUS | Status: DC

## 2012-06-20 NOTE — Progress Notes (Signed)
Subjective:    Patient ID: Tina Pacheco, female    DOB: 12/22/1950, 62 y.o.   MRN: [id number]  HPI  Very pleasant 62 yo female here for:  1.  Irritated biopsy site- Had SK removed and biopsied by Campbell Stall in February 2014.  Has been painful and irritated since.  Placed her on doxycyline, no improvement.  Went back to see her and "she cut again per pt."  Told second biopsy still negative.  Still burns.  Feels like a sunburn. Trying Aloe Vera, lidocaine and ice.  Area won't heal and feels like a nerve, shooting pain across her back at level where lesion is.  2.  Nose bleeds- has had recurrent nose bleeds from left side of nose last month.  Now occuring more frequently.  Humidifiers not helping.  Episodes do not last longer than minutes. She is on Xarelto.  No blood in stool or urine that she is aware of.  Patient Active Problem List  Diagnosis  . Atrial fibrillation  . Encounter for long-term (current) use of anticoagulants  . Obesity  . Hyperlipidemia  . HTN (hypertension)  . CAD (coronary artery disease)  . Diastolic dysfunction  . Rheumatoid arthritis  . Dizziness  . Allergic rhinitis  . Trapezius muscle spasm  . Chronic cough  . Right knee pain  . Epigastric pain  . Epistaxis  . Skin lesion   Past Medical History  Diagnosis Date  . Hypertension   . Dyslipidemia   . Arrhythmia     paroxysmal atrial fibrillation  . CHF (congestive heart failure)     chronic diastolic  . Morbid obesity   . Hypokalemia   . Coronary artery disease     nonobstructive CAD  . Asthma   . Rheumatoid arthritis   . Basal cell carcinoma   . Hyperplastic colonic polyp 2003  . History of gout   . COPD (chronic obstructive pulmonary disease)    Past Surgical History  Procedure Laterality Date  . Cardiac catheterization  05/24/2010    nonobstructive CAD  . Knee arthroscopy      bilateral  . Vaginal hysterectomy    . Cholecystectomy    . Cesarean section    . Colonoscopy  06/12/2011    Procedure: COLONOSCOPY;  Surgeon: Charna Elizabeth, MD;  Location: [facility];  Service: Endoscopy;  Laterality: N/A;   History  Substance Use Topics  . Smoking status: Never Smoker   . Smokeless tobacco: Never Used  . Alcohol Use: Yes     Comment: occasional-red wine   Family History  Problem Relation Age of Onset  . Tuberculosis Mother   . Parkinsonism Mother   . Diabetes type II Sister   . Breast cancer Sister   . Breast cancer Maternal Aunt    Allergies  Allergen Reactions  . Penicillins     unknown   Current Outpatient Prescriptions on File Prior to Visit  Medication Sig Dispense Refill  . albuterol (PROVENTIL HFA;VENTOLIN HFA) 108 (90 BASE) MCG/ACT inhaler Inhale 2 puffs into the lungs every 6 (six) hours as needed. For shortness of breath  3 Inhaler  1  . budesonide-formoterol (SYMBICORT) 160-4.5 MCG/ACT inhaler Inhale 2 puffs into the lungs 2 (two) times daily as needed. For shortness of breath  3 Inhaler  1  . chlorpheniramine-HYDROcodone (TUSSIONEX) 10-8 MG/5ML LQCR take 1 teaspoonful by mouth every 12 hours if needed  480 mL  0  . fexofenadine (ALLEGRA) 180 MG tablet Take 180 mg by mouth daily.      Marland Kitchen  fluticasone (FLONASE) 50 MCG/ACT nasal spray Place 2 sprays into the nose daily as needed. For allergies       . hydrochlorothiazide (HYDRODIURIL) 25 MG tablet Take 1 tablet (25 mg total) by mouth daily.  90 tablet  3  . leflunomide (ARAVA) 20 MG tablet Take 20 mg by mouth daily.      Marland Kitchen lisinopril (PRINIVIL,ZESTRIL) 10 MG tablet take 1 tablet by mouth once daily  90 tablet  1  . losartan (COZAAR) 100 MG tablet Take 1 tablet (100 mg total) by mouth daily.  30 tablet  6  . methotrexate (RHEUMATREX) 2.5 MG tablet Take 15 mg by mouth once a week. Caution:Chemotherapy. Protect from light. Take six tablets weekly on saturdays      . metoprolol succinate (TOPROL-XL) 50 MG 24 hr tablet take 1 tablet by mouth once daily  30 tablet  6  . montelukast (SINGULAIR) 10 MG tablet Take 1  tablet (10 mg total) by mouth at bedtime.  90 tablet  1  . Multiple Vitamin (MULTIVITAMIN) tablet Take 1 tablet by mouth daily.        Marland Kitchen oxybutynin (DITROPAN-XL) 10 MG 24 hr tablet Take 1 tablet (10 mg total) by mouth daily.  270 tablet  1  . oxyCODONE (ROXICODONE) 5 MG immediate release tablet Take 1 tablet (5 mg total) by mouth every 8 (eight) hours as needed.  270 tablet  0  . potassium chloride SA (K-DUR,KLOR-CON) 20 MEQ tablet Take 1 tablet (20 mEq total) by mouth 2 (two) times daily. May take extra tab when taking torsemide  180 tablet  3  . Rivaroxaban (XARELTO) 20 MG TABS Take 1 tablet (20 mg total) by mouth daily.  90 tablet  6  . rosuvastatin (CRESTOR) 10 MG tablet Take 1 tablet (10 mg total) by mouth daily.  90 tablet  3  . Specialty Vitamins Products (MAGNESIUM, AMINO ACID CHELATE,) 133 MG tablet Take 1 tablet by mouth at bedtime.       Marland Kitchen terazosin (HYTRIN) 10 MG capsule Take 1 capsule (10 mg total) by mouth at bedtime.  90 capsule  1  . torsemide (DEMADEX) 20 MG tablet Take 20 mg by mouth daily as needed. For fluid retention       . zolpidem (AMBIEN) 10 MG tablet take 1 tablet by mouth at bedtime if needed  30 tablet  1  . [DISCONTINUED] oxybutynin (DITROPAN-XL) 10 MG 24 hr tablet Take 10 mg by mouth daily.        No current facility-administered medications on file prior to visit.   The PMH, PSH, Social History, Family History, Medications, and allergies have been reviewed in [facility], and have been updated if relevant.   Review of Systems  See HPI     Objective:   Physical Exam BP 110/72  Pulse 80  Temp(Src) 97.7 F (36.5 C)  Wt 309 lb (140.161 kg)  BMI 52.24 kg/m2  General:  Well-developed,well-nourished,in no acute distress; alert,appropriate and cooperative throughout examination Head:  normocephalic and atraumatic.   Eyes:  vision grossly intact, pupils equal, pupils round, and pupils reactive to light.   Ears:  R ear normal and L ear normal.   Nose:  no external  deformity.   +surface visible blood vessel left nare Mouth:  good dentition.   Lungs:  Normal respiratory effort, chest expands symmetrically. Lungs are clear to auscultation, no crackles or wheezes. Heart:  Normal rate and regular rhythm. S1 and S2 normal without gallop, murmur, click, rub  or other extra sounds. Abdomen:  Bowel sounds positive,abdomen soft and non-tender without masses, organomegaly or hernias noted. Msk:  No deformity or scoliosis noted of thoracic or lumbar spine.   Extremities:  No clubbing, cyanosis, edema, or deformity noted with normal full range of motion of all joints.   Neurologic:  alert & oriented X3 and gait normal.   Skin:   Circular one cm area on lower back with central scabbing, no drainage, no warmth or surrounding erythema Cervical Nodes:  No lymphadenopathy noted Axillary Nodes:  No palpable lymphadenopathy Psych:  Cognition and judgment appear intact. Alert and cooperative with normal attention span and concentration. No apparent delusions, illusions, hallucinations     Assessment & Plan:  1. Epistaxis Deteriorated. Refer to ENT for cautery.  2. Skin lesion Irritated SK biopsy site- ? Possible nerve irritation. Will start lidoderm patch (not to be used directly over lesion). Call or return to clinic prn if these symptoms worsen or fail to improve as anticipated. The patient indicates understanding of these issues and agrees with the plan.

## 2012-06-20 NOTE — Patient Instructions (Addendum)
Good to see you. See Shirlee Limerick about your ENT referral before you leave today.  Let's try the Lidoderm- please do not place patch directly on wound.  Have a wonderful time at the wedding.

## 2012-06-24 ENCOUNTER — Ambulatory Visit (INDEPENDENT_AMBULATORY_CARE_PROVIDER_SITE_OTHER): Payer: Federal, State, Local not specified - PPO | Admitting: Cardiovascular Disease

## 2012-06-24 ENCOUNTER — Other Ambulatory Visit: Payer: Self-pay | Admitting: Cardiovascular Disease

## 2012-06-24 ENCOUNTER — Encounter: Payer: Self-pay | Admitting: Cardiovascular Disease

## 2012-06-24 VITALS — BP 98/58 | HR 89 | Ht 64.5 in | Wt 308.5 lb

## 2012-06-24 DIAGNOSIS — E876 Hypokalemia: Secondary | ICD-10-CM

## 2012-06-24 DIAGNOSIS — R5381 Other malaise: Secondary | ICD-10-CM

## 2012-06-24 DIAGNOSIS — R42 Dizziness and giddiness: Secondary | ICD-10-CM

## 2012-06-24 DIAGNOSIS — R5383 Other fatigue: Secondary | ICD-10-CM

## 2012-06-24 DIAGNOSIS — I4891 Unspecified atrial fibrillation: Secondary | ICD-10-CM

## 2012-06-24 DIAGNOSIS — E785 Hyperlipidemia, unspecified: Secondary | ICD-10-CM

## 2012-06-24 DIAGNOSIS — I5189 Other ill-defined heart diseases: Secondary | ICD-10-CM

## 2012-06-24 DIAGNOSIS — E669 Obesity, unspecified: Secondary | ICD-10-CM

## 2012-06-24 DIAGNOSIS — I10 Essential (primary) hypertension: Secondary | ICD-10-CM

## 2012-06-24 DIAGNOSIS — R0602 Shortness of breath: Secondary | ICD-10-CM

## 2012-06-24 DIAGNOSIS — I519 Heart disease, unspecified: Secondary | ICD-10-CM

## 2012-06-24 DIAGNOSIS — I2510 Atherosclerotic heart disease of native coronary artery without angina pectoris: Secondary | ICD-10-CM

## 2012-06-24 MED ORDER — LOSARTAN POTASSIUM 50 MG PO TABS: 50.0000 mg | ORAL_TABLET | Freq: Every day | ORAL | Status: DC

## 2012-06-24 NOTE — Assessment & Plan Note (Signed)
Cholesterol is at goal on the current lipid regimen. No changes to the medications were made.  

## 2012-06-24 NOTE — Patient Instructions (Addendum)
You are doing well. Please cut the losartan in 1/2 daily If your blood pressure continue to run low, call the office  We will check labs today  Please call us if you have new issues that need to be addressed before your next appt.  Your physician wants you to follow-up in: 6 months.  You will receive a reminder letter in the mail two months in advance. If you don't receive a letter, please call our office to schedule the follow-up appointment.

## 2012-06-24 NOTE — Assessment & Plan Note (Signed)
Currently with no symptoms of angina. No further workup at this time. Continue current medication regimen. 

## 2012-06-24 NOTE — Assessment & Plan Note (Signed)
Episodes of dizziness. Seem to be positional. In the setting of very low blood pressure, we'll decrease her losartan.

## 2012-06-24 NOTE — Progress Notes (Signed)
Patient ID: Tina Pacheco, female    DOB: [birth year], 62 y.o.   MRN: [id number]  HPI Comments: Tina Pacheco is a 62 year old woman with morbid obesity,  who was  admitted to  [facility] on May 23 2010  for paroxysmal atrial fibrillation, echocardiogram showing normal LV function with diastolic dysfunction, also with hyperlipidemia,  hypertension, cardiac catheterization May 24 2010 showing mild nonobstructive coronary artery disease,  history of asthma/COPD, suspected sleep apnea, rheumatoid arthritis who presents for routine followup. episodes of atrial fibrillation were March and July 2012, October 2013.  episode of atrial fibrillation 12/16/2011. She was started on diltiazem infusion and converted to normal sinus rhythm several hours later.  She denies any recent episodes. Continues to have chronic shortness of breath. Dizziness with bending down and standing up. Blood pressure has been running low. Concerned about her potassium which normally runs low. She takes HCTZ daily. Reports having a bloody nose at times. Scheduled to see ear nose throat  Family previously indicated  that they thought that she has obstructive sleep apnea. She does not think that she can sleep with a CPAP.  All of her atrial fibrillation episodes have occurred first thing in the morning prior to medications.   EKG shows normal sinus rhythm with rate of 89 beats per minute with no significant ST or T wave changes    Outpatient Encounter Prescriptions as of 06/24/2012  Medication Sig Dispense Refill  . albuterol (PROVENTIL HFA;VENTOLIN HFA) 108 (90 BASE) MCG/ACT inhaler Inhale 2 puffs into the lungs every 6 (six) hours as needed. For shortness of breath  3 Inhaler  1  . budesonide-formoterol (SYMBICORT) 160-4.5 MCG/ACT inhaler Inhale 2 puffs into the lungs 2 (two) times daily as needed. For shortness of breath  3 Inhaler  1  . chlorpheniramine-HYDROcodone (TUSSIONEX) 10-8 MG/5ML LQCR take 1 teaspoonful by mouth  every 12 hours if needed  480 mL  0  . fexofenadine (ALLEGRA) 180 MG tablet Take 180 mg by mouth daily.      . fluticasone (FLONASE) 50 MCG/ACT nasal spray Place 2 sprays into the nose daily as needed. For allergies       . hydrochlorothiazide (HYDRODIURIL) 25 MG tablet Take 1 tablet (25 mg total) by mouth daily.  90 tablet  3  . leflunomide (ARAVA) 20 MG tablet Take 20 mg by mouth daily.      Marland Kitchen lidocaine (LIDODERM) 5 % Place 1 patch onto the skin daily. Remove & Discard patch within 12 hours or as directed by MD  30 patch  0  . lisinopril (PRINIVIL,ZESTRIL) 10 MG tablet take 1 tablet by mouth once daily  90 tablet  1  . losartan (COZAAR) 100 MG tablet Take 1 tablet (100 mg total) by mouth daily.  30 tablet  6  . methotrexate (RHEUMATREX) 2.5 MG tablet Take 15 mg by mouth once a week. Caution:Chemotherapy. Protect from light. Take six tablets weekly on saturdays      . metoprolol succinate (TOPROL-XL) 50 MG 24 hr tablet take 1 tablet by mouth once daily  30 tablet  6  . montelukast (SINGULAIR) 10 MG tablet Take 1 tablet (10 mg total) by mouth at bedtime.  90 tablet  1  . Multiple Vitamin (MULTIVITAMIN) tablet Take 1 tablet by mouth daily.        Marland Kitchen nystatin (MYCOSTATIN/NYSTOP) 100000 UNIT/GM POWD Apply to area three times daily as needed for rash  60 g  0  . oxybutynin (DITROPAN-XL) 10 MG 24  hr tablet Take 1 tablet (10 mg total) by mouth daily.  270 tablet  1  . oxyCODONE (ROXICODONE) 5 MG immediate release tablet Take 1 tablet (5 mg total) by mouth every 8 (eight) hours as needed.  270 tablet  0  . potassium chloride SA (K-DUR,KLOR-CON) 20 MEQ tablet Take 1 tablet (20 mEq total) by mouth 2 (two) times daily. May take extra tab when taking torsemide  180 tablet  3  . Rivaroxaban (XARELTO) 20 MG TABS Take 1 tablet (20 mg total) by mouth daily.  90 tablet  6  . rosuvastatin (CRESTOR) 10 MG tablet Take 1 tablet (10 mg total) by mouth daily.  90 tablet  3  . Specialty Vitamins Products (MAGNESIUM, AMINO  ACID CHELATE,) 133 MG tablet Take 1 tablet by mouth at bedtime.       Marland Kitchen terazosin (HYTRIN) 10 MG capsule Take 1 capsule (10 mg total) by mouth at bedtime.  90 capsule  1  . torsemide (DEMADEX) 20 MG tablet Take 20 mg by mouth daily as needed. For fluid retention       . zolpidem (AMBIEN) 10 MG tablet take 1 tablet by mouth at bedtime if needed  30 tablet  1   No facility-administered encounter medications on file as of 06/24/2012.    Review of Systems  Constitutional: Negative.   HENT: Negative.        Bloody nose  Eyes: Negative.   Respiratory: Positive for shortness of breath.   Cardiovascular: Negative.   Gastrointestinal: Negative.   Musculoskeletal: Positive for arthralgias and gait problem.  Skin: Negative.   Neurological: Positive for dizziness.  Psychiatric/Behavioral: Negative.   All other systems reviewed and are negative.    BP 98/58  Pulse 89  Ht 5' 4.5" (1.638 m)  Wt 308 lb 8 oz (139.935 kg)  BMI 52.16 kg/m2  Physical Exam  Nursing note and vitals reviewed. Constitutional: She is oriented to person, place, and time. She appears well-developed and well-nourished.  obese  HENT:  Head: Normocephalic.  Nose: Nose normal.  Mouth/Throat: Oropharynx is clear and moist.  Eyes: Conjunctivae are normal. Pupils are equal, round, and reactive to light.  Neck: Normal range of motion. Neck supple. No JVD present.  Cardiovascular: Normal rate, regular rhythm, S1 normal, S2 normal, normal heart sounds and intact distal pulses.  Exam reveals no gallop and no friction rub.   No murmur heard. Pulmonary/Chest: Effort normal and breath sounds normal. No respiratory distress. She has no wheezes. She has no rales. She exhibits no tenderness.  Abdominal: Soft. Bowel sounds are normal. She exhibits no distension. There is no tenderness.  Musculoskeletal: Normal range of motion. She exhibits no edema and no tenderness.  Lymphadenopathy:    She has no cervical adenopathy.   Neurological: She is alert and oriented to person, place, and time. Coordination normal.  Skin: Skin is warm and dry. No rash noted. No erythema.  Psychiatric: She has a normal mood and affect. Her behavior is normal. Judgment and thought content normal.    Assessment and Plan

## 2012-06-24 NOTE — Assessment & Plan Note (Signed)
Blood pressures running low. We have suggested she decrease her losartan down to 50 mg daily

## 2012-06-24 NOTE — Assessment & Plan Note (Signed)
I think her morbid obesity is likely contributing to chronic underlying shortness of breath. She is very deconditioned.

## 2012-06-24 NOTE — Assessment & Plan Note (Signed)
No recent episodes of atrial fibrillation. Currently on anticoagulation. Some concern of obstructive sleep apnea which could be contributing to arrhythmia.

## 2012-06-24 NOTE — Assessment & Plan Note (Signed)
No significant edema concerning for diastolic CHF. We'll continue current medications, decreasing losartan

## 2012-06-25 LAB — BASIC METABOLIC PANEL
BUN/Creatinine Ratio: 22 (ref 11–26)
Chloride: 102 mmol/L (ref 97–108)
GFR calc Af Amer: 62 mL/min/{1.73_m2} (ref >59)
GFR calc non Af Amer: 53 mL/min/{1.73_m2} — ABNORMAL LOW (ref >59)
Potassium: 5.4 mmol/L — ABNORMAL HIGH (ref 3.5–5.2)
Sodium: 136 mmol/L (ref 134–144)

## 2012-06-26 ENCOUNTER — Other Ambulatory Visit: Payer: Self-pay

## 2012-06-26 MED ORDER — POTASSIUM CHLORIDE CRYS ER 20 MEQ PO TBCR: 10.0000 meq | EXTENDED_RELEASE_TABLET | Freq: Two times a day (BID) | ORAL | Status: DC

## 2012-07-02 ENCOUNTER — Telehealth: Payer: Self-pay

## 2012-07-02 ENCOUNTER — Other Ambulatory Visit: Payer: Self-pay | Admitting: Family Medicine

## 2012-07-02 NOTE — Telephone Encounter (Signed)
Pt reports BP still staying "too low", c/o "no energy" and feeling tired all the time Decreased losartan to 50 mg daily but has not helped symptoms I advised ok to try decreasing further to 25 mg daily to see if this helps I will call her back Friday 5/9 to reassess Understanding verb

## 2012-07-02 NOTE — Telephone Encounter (Signed)
Pt wanted to call and let you know her BP is still running low. Consistently  150/50, "no energy, I have to walk a couple steps and sit down"

## 2012-07-03 ENCOUNTER — Telehealth: Payer: Self-pay | Admitting: Family Medicine

## 2012-07-03 MED ORDER — ONDANSETRON HCL 4 MG PO TABS: 4.0000 mg | ORAL_TABLET | Freq: Three times a day (TID) | ORAL | Status: DC | PRN

## 2012-07-03 NOTE — Telephone Encounter (Signed)
Tried to call patient, voicemail is full. Will try again later.

## 2012-07-03 NOTE — Telephone Encounter (Signed)
Medication for HA all over the counter- Tylenol is safest since she is on Xarelto.  Zofran sent to her pharmacy. Please keep [place] update with symptoms.

## 2012-07-03 NOTE — Telephone Encounter (Signed)
Received refill request electronically. Last office visit 06/20/12. Is it okay to refill?

## 2012-07-03 NOTE — Telephone Encounter (Signed)
Pt has  sinus H/A (pain across forehead above eyes) with N&V and dizziness for 5 days. Pain level 6-7 out of 10 now. Meclizine does not help N&V and pt wants med for h/a and nausea and vomiting sent to [facility]. Pt request call back.

## 2012-07-04 NOTE — Telephone Encounter (Signed)
Spoke with patient, she did get the zofran and is feeling a little better.

## 2012-07-04 NOTE — Telephone Encounter (Signed)
Medicine called to [facility]. 

## 2012-07-05 ENCOUNTER — Ambulatory Visit (INDEPENDENT_AMBULATORY_CARE_PROVIDER_SITE_OTHER): Payer: Federal, State, Local not specified - PPO

## 2012-07-05 ENCOUNTER — Telehealth: Payer: Self-pay

## 2012-07-05 VITALS — BP 115/71

## 2012-07-05 DIAGNOSIS — R5383 Other fatigue: Secondary | ICD-10-CM

## 2012-07-05 DIAGNOSIS — I959 Hypotension, unspecified: Secondary | ICD-10-CM

## 2012-07-05 DIAGNOSIS — R531 Weakness: Secondary | ICD-10-CM

## 2012-07-05 NOTE — Progress Notes (Signed)
Pt here for c/o low BP via home Bp cuff. She brought cuff and we were able to compare her cuff with our manual cuff. Her cuff=115/71 Our manual cuff=114/69  She is getting low BPs at home 80-90 SBP. Already decreased losartan to 25 mg daily  She c/o feeling very fatigued and weak. She appears pale. Denies any active bleeding. No blood in urine/BM, coughing up blood or spitting up blood. CBC was ordered We will call her with results  In meantime, we will have her decrease lisinopril to 5 mg daily and to continue to monitor BP at home She will call us should BP remain below 100 mm hg SBP

## 2012-07-05 NOTE — Patient Instructions (Addendum)
We will check labs today and will call you with results  Decrease lisinopril to 5 mg daily   Call for blood pressures remaining less than 100/70

## 2012-07-05 NOTE — Telephone Encounter (Signed)
Pt reports BP still running low  Last night and all day yesterday=81/61, 80/40 She says she changed batteries in machine and tried adjusting it and kept getting low readings Overall she reports she "feels much better" Continues to take losartan 25 mg daily She asks if we should decrease lisinopril as well. I advised, before we begin making more changes to meds, we should make sure home BP cuff is accurate I advised she come in for comparison of these BP machines She will come in today at 1100

## 2012-07-05 NOTE — Telephone Encounter (Signed)
Assess BP 

## 2012-07-06 LAB — CBC WITH DIFFERENTIAL
Basophils Absolute: 0 10*3/uL (ref 0.0–0.2)
Basos: 1 % (ref 0–3)
Eosinophils Absolute: 0.2 10*3/uL (ref 0.0–0.4)
Hemoglobin: 10.2 g/dL — ABNORMAL LOW (ref 11.1–15.9)
Immature Grans (Abs): 0 10*3/uL (ref 0.0–0.1)
MCH: 28.5 pg (ref 26.6–33.0)
MCHC: 33.4 g/dL (ref 31.5–35.7)
Monocytes Absolute: 0.3 10*3/uL (ref 0.1–0.9)
Neutrophils Absolute: 1.9 10*3/uL (ref 1.4–7.0)
Neutrophils Relative %: 42 % (ref 40–74)
RDW: 16.9 % — ABNORMAL HIGH (ref 12.3–15.4)

## 2012-07-08 NOTE — Telephone Encounter (Signed)
Pt calling back stating that her BP is running low for the last 3 days. Bottom number in the 50's and top in 120's

## 2012-07-08 NOTE — Telephone Encounter (Signed)
Pt reports Bps are "low"-SBP=110-120, DBP=53-71 I reassured her these readings are good and should continue taking meds as she is doing (losartan 25 mg daily and 1/2 lisinopril) Understanding verb Continues to have occasional dizziness but confirms improved energy

## 2012-07-10 ENCOUNTER — Other Ambulatory Visit: Payer: Self-pay | Admitting: *Deleted

## 2012-07-10 MED ORDER — ONDANSETRON HCL 4 MG PO TABS: 4.0000 mg | ORAL_TABLET | Freq: Three times a day (TID) | ORAL | Status: DC | PRN

## 2012-07-10 NOTE — Telephone Encounter (Signed)
Pt is requesting refill on zofran.  She is driving to [place] on Friday and states that she gets car sick.

## 2012-07-14 NOTE — Progress Notes (Signed)
Could hold losartan if BP still low

## 2012-07-15 ENCOUNTER — Telehealth: Payer: Self-pay

## 2012-07-15 NOTE — Telephone Encounter (Signed)
See below

## 2012-07-15 NOTE — Progress Notes (Signed)
Pt informed She will hold losartan to see how BPs respond; still getting readings as follows: 97/45 98/53 94/51  122/68 101/47  Will forward lab results to Dr. Dayton Martes per Dr. Windell Hummingbird request (see lab results note)

## 2012-07-15 NOTE — Telephone Encounter (Signed)
Message copied by Marcelle Overlie on Mon Jul 15, 2012  8:48 AM ------      Message from: Antonieta Iba      Created: Sun Jul 14, 2012 11:26 AM                   ----- Message -----         From: Marcelle Overlie, RN         Sent: 07/05/2012  11:30 AM           To: Antonieta Iba, MD             ------

## 2012-07-15 NOTE — Progress Notes (Signed)
lmtcb

## 2012-07-23 ENCOUNTER — Encounter: Payer: Self-pay | Admitting: Family Medicine

## 2012-07-23 ENCOUNTER — Ambulatory Visit (INDEPENDENT_AMBULATORY_CARE_PROVIDER_SITE_OTHER): Payer: Federal, State, Local not specified - PPO | Admitting: Family Medicine

## 2012-07-23 VITALS — BP 118/76 | HR 84 | Temp 97.9°F | Wt 297.0 lb

## 2012-07-23 DIAGNOSIS — R112 Nausea with vomiting, unspecified: Secondary | ICD-10-CM | POA: Insufficient documentation

## 2012-07-23 DIAGNOSIS — R42 Dizziness and giddiness: Secondary | ICD-10-CM

## 2012-07-23 DIAGNOSIS — R51 Headache: Secondary | ICD-10-CM

## 2012-07-23 DIAGNOSIS — R110 Nausea: Secondary | ICD-10-CM

## 2012-07-23 DIAGNOSIS — R519 Headache, unspecified: Secondary | ICD-10-CM | POA: Insufficient documentation

## 2012-07-23 MED ORDER — SCOPOLAMINE 1 MG/3DAYS TD PT72: 1.0000 | MEDICATED_PATCH | TRANSDERMAL | Status: DC

## 2012-07-23 MED ORDER — SUMATRIPTAN SUCCINATE 25 MG PO TABS: 25.0000 mg | ORAL_TABLET | ORAL | Status: DC | PRN

## 2012-07-23 MED ORDER — TERAZOSIN HCL 10 MG PO CAPS: 10.0000 mg | ORAL_CAPSULE | Freq: Every day | ORAL | Status: DC

## 2012-07-23 NOTE — Progress Notes (Signed)
Subjective:    Patient ID: Tina Pacheco, female    DOB: 01/05/1951, 62 y.o.   MRN: [id number]  HPI  Very pleasant 62 yo female here for HA, nausea and vomiting x 20 days.  Has had history migraines years ago and not sure if this feels similar.  She does have sensitivity to light. She also has a h/o BPV and states this is different.  Room is not spinning. No night time awakenings or early morning vomiting.  No focal neurological deficits.  Zofran is helping a little with nausea.  Recently started on Uloric by rheum for elevated uric acid but states these symptoms started prior to starting Uloric.  Patient Active Problem List   Diagnosis Date Noted  . Nausea with vomiting 07/23/2012  . Headache 07/23/2012  . Skin lesion 04/29/2012  . Epigastric pain 11/28/2011  . Right knee pain 11/16/2011  . Chronic cough 08/17/2011  . Trapezius muscle spasm 07/21/2011  . Allergic rhinitis 07/10/2011  . Dizziness 03/29/2011  . Rheumatoid arthritis 03/06/2011  . Obesity 06/22/2010  . Hyperlipidemia 06/22/2010  . HTN (hypertension) 06/22/2010  . CAD (coronary artery disease) 06/22/2010  . Diastolic dysfunction 06/22/2010  . Atrial fibrillation 05/27/2010  . Encounter for long-term (current) use of anticoagulants 05/27/2010   Past Medical History  Diagnosis Date  . Hypertension   . Dyslipidemia   . Arrhythmia     paroxysmal atrial fibrillation  . CHF (congestive heart failure)     chronic diastolic  . Morbid obesity   . Hypokalemia   . Coronary artery disease     nonobstructive CAD  . Asthma   . Rheumatoid arthritis   . Basal cell carcinoma   . Hyperplastic colonic polyp 2003  . History of gout   . COPD (chronic obstructive pulmonary disease)    Past Surgical History  Procedure Laterality Date  . Cardiac catheterization  05/24/2010    nonobstructive CAD  . Knee arthroscopy      bilateral  . Vaginal hysterectomy    . Cholecystectomy    . Cesarean section    . Colonoscopy   06/12/2011    Procedure: COLONOSCOPY;  Surgeon: Charna Elizabeth, MD;  Location: [facility];  Service: Endoscopy;  Laterality: N/A;   History  Substance Use Topics  . Smoking status: Never Smoker   . Smokeless tobacco: Never Used  . Alcohol Use: Yes     Comment: occasional-red wine   Family History  Problem Relation Age of Onset  . Tuberculosis Mother   . Parkinsonism Mother   . Diabetes type II Sister   . Breast cancer Sister   . Breast cancer Maternal Aunt    Allergies  Allergen Reactions  . Penicillins     unknown   Current Outpatient Prescriptions on File Prior to Visit  Medication Sig Dispense Refill  . albuterol (PROVENTIL HFA;VENTOLIN HFA) 108 (90 BASE) MCG/ACT inhaler Inhale 2 puffs into the lungs every 6 (six) hours as needed. For shortness of breath  3 Inhaler  1  . budesonide-formoterol (SYMBICORT) 160-4.5 MCG/ACT inhaler Inhale 2 puffs into the lungs 2 (two) times daily as needed. For shortness of breath  3 Inhaler  1  . chlorpheniramine-HYDROcodone (TUSSIONEX) 10-8 MG/5ML LQCR take 5 milliliters (1 teaspoonful) by mouth every 12 hours if needed  480 mL  0  . fexofenadine (ALLEGRA) 180 MG tablet Take 180 mg by mouth daily.      . fluticasone (FLONASE) 50 MCG/ACT nasal spray Place 2 sprays into the nose daily  as needed. For allergies       . leflunomide (ARAVA) 20 MG tablet Take 20 mg by mouth daily.      Marland Kitchen lidocaine (LIDODERM) 5 % Place 1 patch onto the skin daily. Remove & Discard patch within 12 hours or as directed by MD  30 patch  0  . losartan (COZAAR) 50 MG tablet Take 25 mg by mouth daily.      . metoprolol succinate (TOPROL-XL) 50 MG 24 hr tablet take 1 tablet by mouth once daily  30 tablet  6  . montelukast (SINGULAIR) 10 MG tablet Take 1 tablet (10 mg total) by mouth at bedtime.  90 tablet  1  . Multiple Vitamin (MULTIVITAMIN) tablet Take 1 tablet by mouth daily.        Marland Kitchen nystatin (MYCOSTATIN/NYSTOP) 100000 UNIT/GM POWD Apply to area three times daily as needed  for rash  60 g  0  . ondansetron (ZOFRAN) 4 MG tablet Take 1 tablet (4 mg total) by mouth every 8 (eight) hours as needed for nausea.  20 tablet  0  . oxyCODONE (ROXICODONE) 5 MG immediate release tablet Take 1 tablet (5 mg total) by mouth every 8 (eight) hours as needed.  270 tablet  0  . potassium chloride SA (K-DUR,KLOR-CON) 20 MEQ tablet Take 0.5 tablets (10 mEq total) by mouth 2 (two) times daily. May take extra tab when taking torsemide  180 tablet  3  . Rivaroxaban (XARELTO) 20 MG TABS Take 1 tablet (20 mg total) by mouth daily.  90 tablet  6  . rosuvastatin (CRESTOR) 10 MG tablet Take 1 tablet (10 mg total) by mouth daily.  90 tablet  3  . Specialty Vitamins Products (MAGNESIUM, AMINO ACID CHELATE,) 133 MG tablet Take 1 tablet by mouth at bedtime.       Marland Kitchen zolpidem (AMBIEN) 10 MG tablet take 1 tablet by mouth at bedtime if needed  30 tablet  1  . oxybutynin (DITROPAN-XL) 10 MG 24 hr tablet Take 1 tablet (10 mg total) by mouth daily.  270 tablet  1  . [DISCONTINUED] oxybutynin (DITROPAN-XL) 10 MG 24 hr tablet Take 10 mg by mouth daily.        No current facility-administered medications on file prior to visit.   The PMH, PSH, Social History, Family History, Medications, and allergies have been reviewed in [facility], and have been updated if relevant.     Review of Systems    See HPI Objective:   Physical Exam BP 118/76  Pulse 84  Temp(Src) 97.9 F (36.6 C)  Wt 297 lb (134.718 kg)  BMI 50.21 kg/m2  General:  Well-developed,well-nourished,in no acute distress; alert,appropriate and cooperative throughout examination Head:  normocephalic and atraumatic.   Eyes:  vision grossly intact, pupils equal, pupils round, and pupils reactive to light.   Neg nystagmus with positional changes. Ears:  R ear normal and L ear normal.   Nose:  no external deformity.   Mouth:  good dentition.   Lungs:  Normal respiratory effort, chest expands symmetrically. Lungs are clear to auscultation, no  crackles or wheezes. Heart:  Normal rate and regular rhythm. S1 and S2 normal without gallop, murmur, click, rub or other extra sounds. Msk:  No deformity or scoliosis noted of thoracic or lumbar spine.   Extremities:  No clubbing, cyanosis, edema, or deformity noted with normal full range of motion of all joints.   Neurologic:  alert & oriented X3 and gait normal.   Skin:  Intact  without suspicious lesions or rashes Psych:  Cognition and judgment appear intact. Alert and cooperative with normal attention span and concentration. No apparent delusions, illusions, hallucinations     Assessment & Plan:   1. Headache With nausea and vomiting. Does seem consistent with migraine.  Will treat with Imitrex.  She will call me this week with an update.  Continue Zofran as needed. If no improvement, consider imaging of head. The patient indicates understanding of these issues and agrees with the plan.

## 2012-07-23 NOTE — Patient Instructions (Addendum)
Good to see you. Please try imitrex- call me this week with an update.

## 2012-07-24 ENCOUNTER — Other Ambulatory Visit: Payer: Self-pay | Admitting: Family Medicine

## 2012-07-24 DIAGNOSIS — D649 Anemia, unspecified: Secondary | ICD-10-CM

## 2012-07-26 ENCOUNTER — Telehealth: Payer: Self-pay | Admitting: *Deleted

## 2012-07-26 NOTE — Telephone Encounter (Signed)
Pt states imitrex is not helping her headache.  She's been taking this for 3 days and still has the headache and nausea.  Please advise on what to do.

## 2012-07-26 NOTE — Telephone Encounter (Signed)
Advised patient.  She is willing to try gabapentin, has not had it before.  Wants a small amount to try to start with.  Uses [facility] [address].

## 2012-07-26 NOTE — Telephone Encounter (Signed)
We could try Gabapentin (neurontin).  Has she taken this before?

## 2012-07-29 MED ORDER — GABAPENTIN 100 MG PO CAPS: 100.0000 mg | ORAL_CAPSULE | Freq: Two times a day (BID) | ORAL | Status: DC

## 2012-07-29 NOTE — Telephone Encounter (Signed)
Advised patient

## 2012-07-29 NOTE — Telephone Encounter (Signed)
Rx sent 

## 2012-08-05 ENCOUNTER — Other Ambulatory Visit: Payer: Self-pay | Admitting: Family Medicine

## 2012-08-06 NOTE — Telephone Encounter (Signed)
Medicine called to [facility]. 

## 2012-08-16 ENCOUNTER — Encounter: Payer: Self-pay | Admitting: Radiology

## 2012-08-19 ENCOUNTER — Other Ambulatory Visit (INDEPENDENT_AMBULATORY_CARE_PROVIDER_SITE_OTHER): Payer: Federal, State, Local not specified - PPO

## 2012-08-19 ENCOUNTER — Encounter: Payer: Self-pay | Admitting: *Deleted

## 2012-08-19 DIAGNOSIS — D649 Anemia, unspecified: Secondary | ICD-10-CM

## 2012-08-19 LAB — IBC PANEL
Iron: 43 ug/dL (ref 42–145)
Transferrin: 215.9 mg/dL (ref 212.0–360.0)

## 2012-08-19 LAB — CBC WITH DIFFERENTIAL/PLATELET
Basophils Relative: 1.5 % (ref 0.0–3.0)
Eosinophils Relative: 10.9 % — ABNORMAL HIGH (ref 0.0–5.0)
Lymphocytes Relative: 49.6 % — ABNORMAL HIGH (ref 12.0–46.0)
Monocytes Relative: 8.7 % (ref 3.0–12.0)
Neutrophils Relative %: 29.3 % — ABNORMAL LOW (ref 43.0–77.0)
RBC: 3.9 Mil/uL (ref 3.87–5.11)
WBC: 4.6 10*3/uL (ref 4.5–10.5)

## 2012-08-19 LAB — VITAMIN B12: Vitamin B-12: 486 pg/mL (ref 211–911)

## 2012-09-02 ENCOUNTER — Encounter: Payer: Self-pay | Admitting: Family Medicine

## 2012-09-10 ENCOUNTER — Encounter: Payer: Self-pay | Admitting: Family Medicine

## 2012-09-10 ENCOUNTER — Ambulatory Visit (INDEPENDENT_AMBULATORY_CARE_PROVIDER_SITE_OTHER): Payer: Federal, State, Local not specified - PPO | Admitting: Family Medicine

## 2012-09-10 VITALS — BP 122/80 | HR 88 | Temp 98.0°F | Wt 299.2 lb

## 2012-09-10 DIAGNOSIS — R05 Cough: Secondary | ICD-10-CM

## 2012-09-10 DIAGNOSIS — M47812 Spondylosis without myelopathy or radiculopathy, cervical region: Secondary | ICD-10-CM

## 2012-09-10 MED ORDER — HYDROCOD POLST-CHLORPHEN POLST 10-8 MG/5ML PO LQCR: ORAL | Status: DC

## 2012-09-10 MED ORDER — OXYCODONE HCL 5 MG PO TABS: 5.0000 mg | ORAL_TABLET | Freq: Three times a day (TID) | ORAL | Status: DC | PRN

## 2012-09-10 NOTE — Progress Notes (Signed)
Patient ID: Tina Pacheco, female    DOB: 01-Aug-1950, 62 y.o.   MRN: [id number]  HPI Comments: Tina Pacheco is a 62 year old with complicated medical history, including  paroxysmal atrial fibrillation here for chronic cough deterioration.  Chronic cough- has episodes of tickle in her throat that leads to terrible cough, often has post tussive emesis. Felt this was secondary to her allergic rhinitis- currently taking flonase, singulair, allegra and symbicort.   CXRs have been neg.  Tussionex has really helped.  No CP.  No SOB (other than when she is coughing).  No fevers or chills.    No difficulty swallowing.  Neck pain- h/o DJD of cervical spine.  Has had some flares lately.  She is working on weight loss which has improved her back pain in general. Wt Readings from Last 3 Encounters:  09/10/12 299 lb 4 oz (135.739 kg)  07/23/12 297 lb (134.718 kg)  06/24/12 308 lb 8 oz (139.935 kg)   No UE weakness or tingling in hands or fingers.  Outpatient Encounter Prescriptions as of 09/10/2012  Medication Sig Dispense Refill  . albuterol (PROVENTIL HFA;VENTOLIN HFA) 108 (90 BASE) MCG/ACT inhaler Inhale 2 puffs into the lungs every 6 (six) hours as needed. For shortness of breath  3 Inhaler  1  . budesonide-formoterol (SYMBICORT) 160-4.5 MCG/ACT inhaler Inhale 2 puffs into the lungs 2 (two) times daily as needed. For shortness of breath  3 Inhaler  1  . chlorpheniramine-HYDROcodone (TUSSIONEX) 10-8 MG/5ML LQCR take 5 milliliters (1 teaspoonful) by mouth every 12 hours if needed  480 mL  0  . Febuxostat (ULORIC PO) Take one by mouth daily, pt unsure of strength      . fexofenadine (ALLEGRA) 180 MG tablet Take 180 mg by mouth daily.      . fluticasone (FLONASE) 50 MCG/ACT nasal spray Place 2 sprays into the nose daily as needed. For allergies       . gabapentin (NEURONTIN) 100 MG capsule Take 1 capsule (100 mg total) by mouth 2 (two) times daily.  90 capsule  3  . leflunomide (ARAVA) 20 MG  tablet Take 20 mg by mouth daily.      Marland Kitchen lidocaine (LIDODERM) 5 % Place 1 patch onto the skin daily. Remove & Discard patch within 12 hours or as directed by MD  30 patch  0  . lisinopril (PRINIVIL,ZESTRIL) 10 MG tablet       . losartan (COZAAR) 50 MG tablet Take 25 mg by mouth daily.      . metoprolol succinate (TOPROL-XL) 50 MG 24 hr tablet take 1 tablet by mouth once daily  30 tablet  6  . montelukast (SINGULAIR) 10 MG tablet Take 1 tablet (10 mg total) by mouth at bedtime.  90 tablet  1  . Multiple Vitamin (MULTIVITAMIN) tablet Take 1 tablet by mouth daily.        Marland Kitchen nystatin (MYCOSTATIN/NYSTOP) 100000 UNIT/GM POWD Apply to area three times daily as needed for rash  60 g  0  . ondansetron (ZOFRAN) 4 MG tablet Take 1 tablet (4 mg total) by mouth every 8 (eight) hours as needed for nausea.  20 tablet  0  . oxybutynin (DITROPAN-XL) 10 MG 24 hr tablet Take 1 tablet (10 mg total) by mouth daily.  270 tablet  1  . oxyCODONE (ROXICODONE) 5 MG immediate release tablet Take 1 tablet (5 mg total) by mouth every 8 (eight) hours as needed.  270 tablet  0  . potassium chloride  SA (K-DUR,KLOR-CON) 20 MEQ tablet Take 0.5 tablets (10 mEq total) by mouth 2 (two) times daily. May take extra tab when taking torsemide  180 tablet  3  . Rivaroxaban (XARELTO) 20 MG TABS Take 1 tablet (20 mg total) by mouth daily.  90 tablet  6  . rosuvastatin (CRESTOR) 10 MG tablet Take 1 tablet (10 mg total) by mouth daily.  90 tablet  3  . scopolamine (TRANSDERM-SCOP) 1.5 MG Place 1 patch (1.5 mg total) onto the skin every 3 (three) days.  10 patch  12  . Specialty Vitamins Products (MAGNESIUM, AMINO ACID CHELATE,) 133 MG tablet Take 1 tablet by mouth at bedtime.       . SUMAtriptan (IMITREX) 25 MG tablet Take 1 tablet (25 mg total) by mouth every 2 (two) hours as needed for migraine.  10 tablet  0  . terazosin (HYTRIN) 10 MG capsule Take 1 capsule (10 mg total) by mouth at bedtime.  90 capsule  1  . zolpidem (AMBIEN) 10 MG tablet  take 1 tablet by mouth at bedtime  30 tablet  1   No facility-administered encounter medications on file as of 09/10/2012.     Review of Systems  See HPI   Physical Exam  BP 122/80  Pulse 88  Temp(Src) 98 F (36.7 C) (Oral)  Wt 299 lb 4 oz (135.739 kg)  BMI 50.59 kg/m2  SpO2 98%  Constitutional: She is oriented to person, place, and time. She appears well-developed and well-nourished.       obese  HENT:  Head: Normocephalic.  Nose: Nose normal.  Eyes: Conjunctivae are normal. Pupils are equal, round, and reactive to light, no nyastagmus.  Neck: Normal range of motion. Neck supple. No JVD present.  Cardiovascular: Normal rate, regular rhythm, S1 normal, S2 normal, normal heart sounds and intact distal pulses.  Exam reveals no gallop and no friction rub.   No murmur heard. Pulmonary/Chest: Effort normal and breath sounds normal. No respiratory distress. She has no wheezes. She has no rales. She exhibits no tenderness.  Abdominal: Soft. Bowel sounds are normal. She exhibits no distension. There is no tenderness.  Musculoskeletal: Normal range of motion. She exhibits no edema and no tenderness.  Lymphadenopathy:    She has no cervical adenopathy.  Neurological: She is alert and oriented to person, place, and time. Coordination normal.  Skin: Skin is warm and dry. No rash noted. No erythema.  Psychiatric: She has a normal mood and affect. Her behavior is normal. Judgment and thought content normal.         Assessment and Plan  1. Chronic cough Deteriorated again without red flag symptoms on exam again. Will treat with short course tussionex, continue other meds.  2. DJD (degenerative joint disease) of cervical spine Discussed tx options.  She would prefer not to get injections or use oral steroids. No red flag symptoms. She will contact me if symptoms worsen.

## 2012-09-10 NOTE — Patient Instructions (Addendum)
Good to see you. Let's continue with what works- Tussionex.  Call me if symptoms worsen or do improve.

## 2012-09-26 ENCOUNTER — Other Ambulatory Visit: Payer: Self-pay | Admitting: Dermatology

## 2012-10-03 ENCOUNTER — Other Ambulatory Visit: Payer: Self-pay | Admitting: Family Medicine

## 2012-10-04 NOTE — Telephone Encounter (Signed)
Plz phone in

## 2012-10-04 NOTE — Telephone Encounter (Signed)
Refill called to [facility]. 

## 2012-11-03 ENCOUNTER — Other Ambulatory Visit: Payer: Self-pay | Admitting: Cardiovascular Disease

## 2012-11-04 ENCOUNTER — Other Ambulatory Visit: Payer: Self-pay | Admitting: *Deleted

## 2012-11-04 MED ORDER — RIVAROXABAN 20 MG PO TABS: 20.0000 mg | ORAL_TABLET | Freq: Every day | ORAL | Status: DC

## 2012-11-04 NOTE — Telephone Encounter (Signed)
Refilled Xarelto sent to [facility].

## 2012-11-12 ENCOUNTER — Other Ambulatory Visit: Payer: Self-pay | Admitting: *Deleted

## 2012-11-12 MED ORDER — MONTELUKAST SODIUM 10 MG PO TABS: 10.0000 mg | ORAL_TABLET | Freq: Every day | ORAL | Status: DC

## 2012-11-14 ENCOUNTER — Other Ambulatory Visit: Payer: Self-pay | Admitting: Family Medicine

## 2012-11-15 NOTE — Telephone Encounter (Signed)
Refill called to [facility]. 

## 2012-11-20 ENCOUNTER — Encounter: Payer: Self-pay | Admitting: Family Medicine

## 2012-11-20 ENCOUNTER — Ambulatory Visit (INDEPENDENT_AMBULATORY_CARE_PROVIDER_SITE_OTHER): Payer: Federal, State, Local not specified - PPO | Admitting: Family Medicine

## 2012-11-20 VITALS — BP 132/70 | HR 60 | Temp 97.5°F | Wt 303.0 lb

## 2012-11-20 DIAGNOSIS — Z23 Encounter for immunization: Secondary | ICD-10-CM

## 2012-11-20 DIAGNOSIS — M069 Rheumatoid arthritis, unspecified: Secondary | ICD-10-CM

## 2012-11-20 DIAGNOSIS — I5189 Other ill-defined heart diseases: Secondary | ICD-10-CM

## 2012-11-20 DIAGNOSIS — E785 Hyperlipidemia, unspecified: Secondary | ICD-10-CM

## 2012-11-20 DIAGNOSIS — I2510 Atherosclerotic heart disease of native coronary artery without angina pectoris: Secondary | ICD-10-CM

## 2012-11-20 DIAGNOSIS — I10 Essential (primary) hypertension: Secondary | ICD-10-CM

## 2012-11-20 DIAGNOSIS — I519 Heart disease, unspecified: Secondary | ICD-10-CM

## 2012-11-20 DIAGNOSIS — M1990 Unspecified osteoarthritis, unspecified site: Secondary | ICD-10-CM | POA: Insufficient documentation

## 2012-11-20 LAB — LIPID PANEL
Cholesterol: 198 mg/dL (ref 0–200)
Triglycerides: 90 mg/dL (ref 0.0–149.0)

## 2012-11-20 LAB — COMPREHENSIVE METABOLIC PANEL
Albumin: 3.6 g/dL (ref 3.5–5.2)
BUN: 25 mg/dL — ABNORMAL HIGH (ref 6–23)
CO2: 29 mEq/L (ref 19–32)
Calcium: 10 mg/dL (ref 8.4–10.5)
GFR: 70.88 mL/min (ref >60.00)
Glucose, Bld: 83 mg/dL (ref 70–99)
Potassium: 4.6 mEq/L (ref 3.5–5.1)
Sodium: 136 mEq/L (ref 135–145)
Total Protein: 6.8 g/dL (ref 6.0–8.3)

## 2012-11-20 MED ORDER — ROSUVASTATIN CALCIUM 10 MG PO TABS: 10.0000 mg | ORAL_TABLET | Freq: Every day | ORAL | Status: DC

## 2012-11-20 NOTE — Patient Instructions (Addendum)
Good to see you. We will call you with your lab results.  [facility] (therapy pool).

## 2012-11-20 NOTE — Addendum Note (Signed)
Addended by: Shon Millet on: 11/20/2012 11:40 AM   Modules accepted: Orders

## 2012-11-20 NOTE — Progress Notes (Signed)
Patient ID: Tina Pacheco, female    DOB: [birth year], 62 y.o.   MRN: [id number]  HPI Comments: [name] is a 62 year old with complicated medical history, including  paroxysmal atrial fibrillation, echocardiogram showing normal LV unction with diastolic dysfunction, hyperlipidemia, obesity, hypertension, cardiac catheterization March 27 showing mild nonobstructive coronary artery disease also a history of asthma/COPD, suspected sleep apnea, rheumatoid arthritis here for 3 month follow up.    Overall, doing "fair."  Has had more joint pain- mainly knees, elbows and feet.  Has h/o OA and RA. Followed by Dr. Dierdre Pacheco.  Just saw him recently and finishing a 10 day course of prednisone which has helped a great deal.  Seeing him again in two weeks.  Saw Dr. Mariah Pacheco in 05/2012- note reviewed.  Seeing him again next month.  HLD- ran out of Crestor.  Due for labs.  Cough has been good.    Trying to use her stationary bike.  Cannot swim due to weather.   Outpatient Encounter Prescriptions as of 11/20/2012  Medication Sig Dispense Refill  . albuterol (PROVENTIL HFA;VENTOLIN HFA) 108 (90 BASE) MCG/ACT inhaler Inhale 2 puffs into the lungs every 6 (six) hours as needed. For shortness of breath  3 Inhaler  1  . budesonide-formoterol (SYMBICORT) 160-4.5 MCG/ACT inhaler Inhale 2 puffs into the lungs 2 (two) times daily as needed. For shortness of breath  3 Inhaler  1  . chlorpheniramine-HYDROcodone (TUSSIONEX) 10-8 MG/5ML LQCR take 5 milliliters (1 teaspoonful) by mouth every 12 hours if needed  480 mL  0  . Febuxostat (ULORIC PO) Take one by mouth daily, pt unsure of strength      . fexofenadine (ALLEGRA) 180 MG tablet Take 180 mg by mouth daily.      . fluticasone (FLONASE) 50 MCG/ACT nasal spray Place 2 sprays into the nose daily as needed. For allergies       . leflunomide (ARAVA) 20 MG tablet Take 20 mg by mouth daily.      Tina Pacheco lisinopril (PRINIVIL,ZESTRIL) 10 MG tablet Take one half (5mg ) daily       . metoprolol succinate (TOPROL-XL) 50 MG 24 hr tablet take 1 tablet by mouth once daily  30 tablet  6  . montelukast (SINGULAIR) 10 MG tablet Take 1 tablet (10 mg total) by mouth at bedtime.  90 tablet  1  . Multiple Vitamin (MULTIVITAMIN) tablet Take 1 tablet by mouth daily.        Tina Pacheco nystatin (MYCOSTATIN/NYSTOP) 100000 UNIT/GM POWD Apply to area three times daily as needed for rash  60 g  0  . oxybutynin (DITROPAN-XL) 10 MG 24 hr tablet Take 10 mg by mouth daily as needed.      Tina Pacheco oxyCODONE (ROXICODONE) 5 MG immediate release tablet Take 1 tablet (5 mg total) by mouth every 8 (eight) hours as needed.  270 tablet  0  . potassium chloride SA (K-DUR,KLOR-CON) 20 MEQ tablet Take 0.5 tablets (10 mEq total) by mouth 2 (two) times daily. May take extra tab when taking torsemide  180 tablet  3  . Rivaroxaban (XARELTO) 20 MG TABS tablet Take 1 tablet (20 mg total) by mouth daily.  90 tablet  3  . terazosin (HYTRIN) 10 MG capsule Take 1 capsule (10 mg total) by mouth at bedtime.  90 capsule  1  . torsemide (DEMADEX) 20 MG tablet Take 20 mg by mouth daily as needed.      . zolpidem (AMBIEN) 10 MG tablet take 1 tablet by mouth  at bedtime  30 tablet  1  . lidocaine (LIDODERM) 5 % Place 1 patch onto the skin daily. Remove & Discard patch within 12 hours or as directed by MD  30 patch  0  . rosuvastatin (CRESTOR) 10 MG tablet Take 1 tablet (10 mg total) by mouth daily.  90 tablet  3  . Specialty Vitamins Products (MAGNESIUM, AMINO ACID CHELATE,) 133 MG tablet Take 1 tablet by mouth at bedtime.       . [DISCONTINUED] losartan (COZAAR) 50 MG tablet Take 25 mg by mouth daily.      . [DISCONTINUED] ondansetron (ZOFRAN) 4 MG tablet Take 1 tablet (4 mg total) by mouth every 8 (eight) hours as needed for nausea.  20 tablet  0  . [DISCONTINUED] scopolamine (TRANSDERM-SCOP) 1.5 MG Place 1 patch (1.5 mg total) onto the skin every 3 (three) days.  10 patch  12  . [DISCONTINUED] SUMAtriptan (IMITREX) 25 MG tablet Take 1  tablet (25 mg total) by mouth every 2 (two) hours as needed for migraine.  10 tablet  0   No facility-administered encounter medications on file as of 11/20/2012.     Review of Systems  See HPI   Physical Exam  BP 132/70  Pulse 60  Temp(Src) 97.5 F (36.4 C) (Oral)  Wt 303 lb (137.44 kg)  BMI 51.23 kg/m2  SpO2 97%  Constitutional: She is oriented to person, place, and time. She appears well-developed and well-nourished.       obese  HENT:  Head: Normocephalic.  Abdominal: Soft. Bowel sounds are normal. She exhibits no distension. There is no tenderness.  Neurological: She is alert and oriented to person, place, and time. Coordination normal.  Skin: Skin is warm and dry. No rash noted. No erythema.  Psychiatric: She has a normal mood and affect. Her behavior is normal. Judgment and thought content normal.    Assessment and Plan:    1. Diastolic dysfunction Followed by Dr. Mariah Pacheco.  No changes in rx.  2. CAD (coronary artery disease) See above.    3. HTN (hypertension) Stable on current rx.  No changes.  4. Hyperlipidemia Refilled Crestor.  Check labs today. - Comprehensive metabolic panel - Lipid Panel  5. Rheumatoid arthritis Followed by Dr. Dierdre Pacheco.  On prednisone.  6. OA (osteoarthritis) See above.

## 2012-11-21 ENCOUNTER — Encounter: Payer: Self-pay | Admitting: *Deleted

## 2012-11-28 ENCOUNTER — Other Ambulatory Visit: Payer: Self-pay | Admitting: Family Medicine

## 2012-11-28 NOTE — Telephone Encounter (Signed)
Last office visit 11/20/2012.  Ok to refill?

## 2012-11-29 NOTE — Telephone Encounter (Signed)
Called into pharmacy

## 2012-12-04 ENCOUNTER — Other Ambulatory Visit: Payer: Self-pay | Admitting: *Deleted

## 2012-12-04 ENCOUNTER — Other Ambulatory Visit: Payer: Self-pay | Admitting: Cardiovascular Disease

## 2012-12-04 MED ORDER — METOPROLOL SUCCINATE ER 50 MG PO TB24: ORAL_TABLET | ORAL | Status: DC

## 2012-12-04 NOTE — Telephone Encounter (Signed)
Refilled Metoprolol sent to [facility].

## 2012-12-05 ENCOUNTER — Other Ambulatory Visit: Payer: Self-pay | Admitting: *Deleted

## 2012-12-05 MED ORDER — OXYCODONE HCL 5 MG PO TABS: 5.0000 mg | ORAL_TABLET | Freq: Three times a day (TID) | ORAL | Status: DC | PRN

## 2012-12-05 NOTE — Telephone Encounter (Signed)
Spoke with patient and advised rx ready for pick-up and it will be at the front desk.  

## 2012-12-05 NOTE — Telephone Encounter (Signed)
Pt will pick up when ready

## 2012-12-25 ENCOUNTER — Encounter: Payer: Self-pay | Admitting: Cardiovascular Disease

## 2012-12-25 ENCOUNTER — Ambulatory Visit (INDEPENDENT_AMBULATORY_CARE_PROVIDER_SITE_OTHER): Payer: Federal, State, Local not specified - PPO | Admitting: Cardiovascular Disease

## 2012-12-25 VITALS — BP 120/64 | HR 72 | Ht 64.0 in | Wt 315.2 lb

## 2012-12-25 DIAGNOSIS — M069 Rheumatoid arthritis, unspecified: Secondary | ICD-10-CM

## 2012-12-25 DIAGNOSIS — I519 Heart disease, unspecified: Secondary | ICD-10-CM

## 2012-12-25 DIAGNOSIS — I4891 Unspecified atrial fibrillation: Secondary | ICD-10-CM

## 2012-12-25 DIAGNOSIS — I5189 Other ill-defined heart diseases: Secondary | ICD-10-CM

## 2012-12-25 DIAGNOSIS — I10 Essential (primary) hypertension: Secondary | ICD-10-CM

## 2012-12-25 DIAGNOSIS — I2510 Atherosclerotic heart disease of native coronary artery without angina pectoris: Secondary | ICD-10-CM

## 2012-12-25 DIAGNOSIS — R0602 Shortness of breath: Secondary | ICD-10-CM

## 2012-12-25 DIAGNOSIS — E785 Hyperlipidemia, unspecified: Secondary | ICD-10-CM

## 2012-12-25 NOTE — Assessment & Plan Note (Signed)
Severe arthritic pain that limits her ability to get around. Also having problems with weight which complicates her symptoms

## 2012-12-25 NOTE — Assessment & Plan Note (Signed)
She take torsemide as needed. No signs of diastolic CHF

## 2012-12-25 NOTE — Assessment & Plan Note (Signed)
Currently with no symptoms of angina. No further workup at this time. Continue current medication regimen. 

## 2012-12-25 NOTE — Progress Notes (Signed)
Patient ID: Tina Pacheco, female    DOB: 06-Jul-1950, 62 y.o.   MRN: [id number]  HPI Comments: [name] is a 62 year old woman with morbid obesity,  who was  admitted to  [facility] on May 23 2010  for paroxysmal atrial fibrillation, echocardiogram showing normal LV function with diastolic dysfunction, also with hyperlipidemia,  hypertension, cardiac catheterization May 24 2010 showing mild nonobstructive coronary artery disease,  history of asthma/COPD, suspected sleep apnea, rheumatoid arthritis who presents for routine followup. episodes of atrial fibrillation were March and July 2012, October 2013.  episode of atrial fibrillation 12/16/2011. She was started on diltiazem infusion and converted to normal sinus rhythm several hours later.   She denies any recent episodes. Continues to have chronic shortness of breath. Her biggest complaint is her arthritis in her knees, legs, all the way to her head. She does have rheumatology followup and was recently on a long course of prednisone Run out of her Crestor and cholesterol increased up to 190 Symptoms of leg pain persisted even without Crestor.  Family previously indicated  that they thought that she has obstructive sleep apnea. She does not think that she can sleep with a CPAP.  All of her atrial fibrillation episodes have occurred first thing in the morning prior to medications.   EKG shows normal sinus rhythm with rate of 72 beats per minute with no significant ST or T wave changes    Outpatient Encounter Prescriptions as of 12/25/2012  Medication Sig Dispense Refill  . albuterol (PROVENTIL HFA;VENTOLIN HFA) 108 (90 BASE) MCG/ACT inhaler Inhale 2 puffs into the lungs every 6 (six) hours as needed. For shortness of breath  3 Inhaler  1  . budesonide-formoterol (SYMBICORT) 160-4.5 MCG/ACT inhaler Inhale 2 puffs into the lungs 2 (two) times daily as needed. For shortness of breath  3 Inhaler  1  . chlorpheniramine-HYDROcodone  (TUSSIONEX) 10-8 MG/5ML LQCR take 5 milliliters (1 teaspoonful) by mouth every 12 hours if needed  480 mL  0  . Febuxostat (ULORIC PO) Take one by mouth daily, pt unsure of strength      . fexofenadine (ALLEGRA) 180 MG tablet Take 180 mg by mouth daily.      . fluticasone (FLONASE) 50 MCG/ACT nasal spray Place 2 sprays into the nose daily as needed. For allergies       . hydroxychloroquine (PLAQUENIL) 200 MG tablet Take 200 mg by mouth 2 (two) times daily.       Marland Kitchen leflunomide (ARAVA) 20 MG tablet Take 20 mg by mouth daily.      Marland Kitchen lidocaine (LIDODERM) 5 % Place 1 patch onto the skin daily. Remove & Discard patch within 12 hours or as directed by MD  30 patch  0  . lisinopril (PRINIVIL,ZESTRIL) 10 MG tablet Take one half (5mg ) daily      . metoprolol succinate (TOPROL-XL) 50 MG 24 hr tablet take 1 tablet by mouth once daily  30 tablet  6  . montelukast (SINGULAIR) 10 MG tablet Take 1 tablet (10 mg total) by mouth at bedtime.  90 tablet  1  . Multiple Vitamin (MULTIVITAMIN) tablet Take 1 tablet by mouth daily.        Marland Kitchen nystatin (MYCOSTATIN/NYSTOP) 100000 UNIT/GM POWD Apply to area three times daily as needed for rash  60 g  0  . oxybutynin (DITROPAN-XL) 10 MG 24 hr tablet Take 10 mg by mouth daily as needed.      Marland Kitchen oxyCODONE (ROXICODONE) 5 MG immediate  release tablet Take 1 tablet (5 mg total) by mouth every 8 (eight) hours as needed.  270 tablet  0  . potassium chloride SA (K-DUR,KLOR-CON) 20 MEQ tablet Take 0.5 tablets (10 mEq total) by mouth 2 (two) times daily. May take extra tab when taking torsemide  180 tablet  3  . Rivaroxaban (XARELTO) 20 MG TABS tablet Take 1 tablet (20 mg total) by mouth daily.  90 tablet  3  . rosuvastatin (CRESTOR) 10 MG tablet Take 1 tablet (10 mg total) by mouth daily.  90 tablet  3  . Specialty Vitamins Products (MAGNESIUM, AMINO ACID CHELATE,) 133 MG tablet Take 1 tablet by mouth at bedtime.       Marland Kitchen terazosin (HYTRIN) 10 MG capsule Take 1 capsule (10 mg total) by  mouth at bedtime.  90 capsule  1  . torsemide (DEMADEX) 20 MG tablet Take 20 mg by mouth daily as needed.      . zolpidem (AMBIEN) 10 MG tablet take 1 tablet by mouth at bedtime  30 tablet  1  . [DISCONTINUED] metoprolol succinate (TOPROL-XL) 50 MG 24 hr tablet take 1 tablet by mouth once daily  30 tablet  6   No facility-administered encounter medications on file as of 12/25/2012.    Review of Systems  Constitutional: Negative.   HENT: Negative.        Bloody nose  Eyes: Negative.   Cardiovascular: Negative.   Gastrointestinal: Negative.   Musculoskeletal: Positive for arthralgias, gait problem and myalgias.  Skin: Negative.   Neurological: Positive for dizziness.  Psychiatric/Behavioral: Negative.   All other systems reviewed and are negative.    BP 120/64  Pulse 72  Ht 5\' 4"  (1.626 m)  Wt 315 lb 4 oz (142.996 kg)  BMI 54.09 kg/m2  Physical Exam  Nursing note and vitals reviewed. Constitutional: She is oriented to person, place, and time. She appears well-developed and well-nourished.  obese  HENT:  Head: Normocephalic.  Nose: Nose normal.  Mouth/Throat: Oropharynx is clear and moist.  Eyes: Conjunctivae are normal. Pupils are equal, round, and reactive to light.  Neck: Normal range of motion. Neck supple. No JVD present.  Cardiovascular: Normal rate, regular rhythm, S1 normal, S2 normal, normal heart sounds and intact distal pulses.  Exam reveals no gallop and no friction rub.   No murmur heard. Pulmonary/Chest: Effort normal and breath sounds normal. No respiratory distress. She has no wheezes. She has no rales. She exhibits no tenderness.  Abdominal: Soft. Bowel sounds are normal. She exhibits no distension. There is no tenderness.  Musculoskeletal: Normal range of motion. She exhibits no edema and no tenderness.  Lymphadenopathy:    She has no cervical adenopathy.  Neurological: She is alert and oriented to person, place, and time. Coordination normal.  Skin: Skin  is warm and dry. No rash noted. No erythema.  Psychiatric: She has a normal mood and affect. Her behavior is normal. Judgment and thought content normal.    Assessment and Plan

## 2012-12-25 NOTE — Assessment & Plan Note (Signed)
Encouraged her to stay on her statin if possible . Cholesterol 140 in the past on Crestor

## 2012-12-25 NOTE — Assessment & Plan Note (Signed)
No further episodes of atrial fibrillation. No changes to her medications

## 2012-12-25 NOTE — Assessment & Plan Note (Signed)
Blood pressure is well controlled on today's visit. No changes made to the medications. 

## 2012-12-25 NOTE — Patient Instructions (Signed)
You are doing well. No medication changes were made.  Please call us if you have new issues that need to be addressed before your next appt.  Your physician wants you to follow-up in: 12 months.  You will receive a reminder letter in the mail two months in advance. If you don't receive a letter, please call our office to schedule the follow-up appointment. 

## 2013-01-13 ENCOUNTER — Telehealth: Payer: Self-pay

## 2013-01-13 NOTE — Telephone Encounter (Signed)
Spoke w/ pt.  Reports that she had a colonoscopy in April and was diagnosed w/ internal hemorrhoids.  Reports that yesterday, there was a lot of bright red blood in her underwear after BM, denies pain or constipation. Same symptoms around 1:15 today. Pt states that she is unsure whether is blood is vaginal or rectal in origin. Sched to see GI tomorrow. Pt took her pm does of xarelto last night, but would like instructions on whether to continue or hold. Please advise.  Thank you!

## 2013-01-13 NOTE — Telephone Encounter (Signed)
Spoke w/ pt.  She is agreeable to plan and will call after her GI appt.

## 2013-01-13 NOTE — Telephone Encounter (Signed)
Would hold xarelto for now. See GI tomorrow  If GI is not concerned, we could restart a lower dose xarelto 15 mg daily She may need a stool softener daily if she has hemorrhoids to avoid further bleeding

## 2013-01-13 NOTE — Telephone Encounter (Signed)
Pt states she has had a colonscopy in April, has started bleeding from her rectum and wants to know if she should stop her Xarelto, please call. Pt has appt with her GI dr tomorrow.

## 2013-01-14 ENCOUNTER — Other Ambulatory Visit: Payer: Self-pay | Admitting: Family Medicine

## 2013-01-21 ENCOUNTER — Telehealth: Payer: Self-pay

## 2013-01-21 NOTE — Telephone Encounter (Signed)
Spoke w/ pt.  She is currently taking 20 mg xarelto and just got a new rx thru mail order. Will leave samples of xarelto 15 mg at front desk for pt to pick up tomorrow.

## 2013-01-21 NOTE — Telephone Encounter (Signed)
Would restart xarelto 15 mg daily She has not had significant atrial fib. Slightly lower dose may be better

## 2013-01-21 NOTE — Telephone Encounter (Signed)
Pt states she is going to start back taking her Xarelto, her GI did not find any problems. Please call.

## 2013-01-31 ENCOUNTER — Other Ambulatory Visit: Payer: Self-pay | Admitting: Family Medicine

## 2013-01-31 NOTE — Telephone Encounter (Signed)
Pt left v/m requesting rx tussionex. Call when ready for pick up. [facility] [employer] request refill zolpidem.Please advise.

## 2013-01-31 NOTE — Telephone Encounter (Signed)
Dr. Elmer Sow last note  In 08/2012 states... "Chronic cough treat with inhalers etc....will treat with short course of tussionex."  Therefore she needs to be reevaluated prior to refills of this controlled substance with hydrocodone in it.  Okay to refill ambien .Marland Kitchen Refill sent.

## 2013-01-31 NOTE — Telephone Encounter (Signed)
Called to [facility] [place]. 

## 2013-02-10 ENCOUNTER — Emergency Department (HOSPITAL_COMMUNITY)
Admission: EM | Admit: 2013-02-10 | Discharge: 2013-02-10 | Disposition: A | Discharge status: Home / Self Care | Payer: Federal, State, Local not specified - PPO | Attending: Emergency Medicine | Admitting: Emergency Medicine

## 2013-02-10 ENCOUNTER — Encounter (HOSPITAL_COMMUNITY): Payer: Self-pay | Admitting: Emergency Medicine

## 2013-02-10 DIAGNOSIS — I509 Heart failure, unspecified: Secondary | ICD-10-CM | POA: Insufficient documentation

## 2013-02-10 DIAGNOSIS — I2510 Atherosclerotic heart disease of native coronary artery without angina pectoris: Secondary | ICD-10-CM | POA: Insufficient documentation

## 2013-02-10 DIAGNOSIS — Z79899 Other long term (current) drug therapy: Secondary | ICD-10-CM | POA: Insufficient documentation

## 2013-02-10 DIAGNOSIS — E785 Hyperlipidemia, unspecified: Secondary | ICD-10-CM | POA: Insufficient documentation

## 2013-02-10 DIAGNOSIS — Z95818 Presence of other cardiac implants and grafts: Secondary | ICD-10-CM | POA: Insufficient documentation

## 2013-02-10 DIAGNOSIS — IMO0002 Reserved for concepts with insufficient information to code with codable children: Secondary | ICD-10-CM | POA: Insufficient documentation

## 2013-02-10 DIAGNOSIS — M069 Rheumatoid arthritis, unspecified: Secondary | ICD-10-CM | POA: Insufficient documentation

## 2013-02-10 DIAGNOSIS — K625 Hemorrhage of anus and rectum: Secondary | ICD-10-CM | POA: Insufficient documentation

## 2013-02-10 DIAGNOSIS — I10 Essential (primary) hypertension: Secondary | ICD-10-CM | POA: Insufficient documentation

## 2013-02-10 DIAGNOSIS — Z860100 Personal history of colon polyps, unspecified: Secondary | ICD-10-CM | POA: Insufficient documentation

## 2013-02-10 DIAGNOSIS — Z85828 Personal history of other malignant neoplasm of skin: Secondary | ICD-10-CM | POA: Insufficient documentation

## 2013-02-10 DIAGNOSIS — J441 Chronic obstructive pulmonary disease with (acute) exacerbation: Secondary | ICD-10-CM | POA: Insufficient documentation

## 2013-02-10 DIAGNOSIS — I4891 Unspecified atrial fibrillation: Secondary | ICD-10-CM | POA: Insufficient documentation

## 2013-02-10 DIAGNOSIS — M109 Gout, unspecified: Secondary | ICD-10-CM | POA: Insufficient documentation

## 2013-02-10 DIAGNOSIS — Z880 Allergy status to penicillin: Secondary | ICD-10-CM | POA: Insufficient documentation

## 2013-02-10 LAB — POCT I-STAT, CHEM 8
Calcium, Ion: 1.06 mmol/L — ABNORMAL LOW (ref 1.13–1.30)
Chloride: 108 mEq/L (ref 96–112)
Creatinine, Ser: 0.9 mg/dL (ref 0.50–1.10)
HCT: 38 % (ref 36.0–46.0)
Hemoglobin: 12.9 g/dL (ref 12.0–15.0)
Potassium: 4.3 mEq/L (ref 3.5–5.1)
TCO2: 25 mmol/L (ref 0–100)

## 2013-02-10 LAB — OCCULT BLOOD, POC DEVICE: Fecal Occult Bld: POSITIVE — AB

## 2013-02-10 NOTE — ED Notes (Signed)
NOTIFIED DR. WARD OF PATIENTS LAB RESULTS OF OCCULT BLOOD STOOL = POSITIVE ( + ) ,@12 :14 PM ,02/10/2013.

## 2013-02-10 NOTE — ED Provider Notes (Signed)
Medical screening examination/treatment/procedure(s) were performed by non-physician practitioner and as supervising physician I was immediately available for consultation/collaboration.  EKG Interpretation   None         Layla Maw Deontae Robson, DO 02/10/13 1643

## 2013-02-10 NOTE — ED Notes (Addendum)
Pt stated that she did not want blood drawn. ED PA notified.

## 2013-02-10 NOTE — ED Provider Notes (Signed)
CSN: [id number]     Arrival date & time 02/10/13  1038 History   First MD Initiated Contact with Patient 02/10/13 1107     Chief Complaint  Patient presents with  . Rectal Bleeding   (Consider location/radiation/quality/duration/timing/severity/associated sxs/prior Treatment) HPI Comments: Patient is a 62 year old female with history of A. Fib, hypertension, dyslipidemia, CHF, or artery disease, COPD who presents today with one day of rectal bleeding. She reports that this has been an ongoing problem for her the past 6 weeks. 6 weeks ago she was seen by Dr. Loreta Ave. At that time the bleeding has stopped and Dr. Loreta Ave was unable to find a reason for her bleeding. Today she went to use the bathroom and found that there was blood on her underwear. She reports there are a few clots. The blood is bright red. She had a colonoscopy in April which showed some internal hemorrhoids. Last bowel movement was yesterday. She denies melena. No pain with bowel movements. She denies any fever, chills, nausea, vomiting, abdominal pain, constipation, diarrhea, fatigue, pallor. She does note that she feels as though she's had an exacerbation of her COPD for the past 3 days. She took none of her COPD medication this morning.  The history is provided by the patient. No language interpreter was used.    Past Medical History  Diagnosis Date  . Hypertension   . Dyslipidemia   . Arrhythmia     paroxysmal atrial fibrillation  . CHF (congestive heart failure)     chronic diastolic  . Morbid obesity   . Hypokalemia   . Coronary artery disease     nonobstructive CAD  . Asthma   . Rheumatoid arthritis(714.0)   . Basal cell carcinoma   . Hyperplastic colonic polyp 2003  . History of gout   . COPD (chronic obstructive pulmonary disease)   . Atrial fibrillation    Past Surgical History  Procedure Laterality Date  . Cardiac catheterization  05/24/2010    nonobstructive CAD  . Knee arthroscopy      bilateral  .  Vaginal hysterectomy    . Cholecystectomy    . Cesarean section    . Colonoscopy  06/12/2011    Procedure: COLONOSCOPY;  Surgeon: Charna Elizabeth, MD;  Location: [facility];  Service: Endoscopy;  Laterality: N/A;   Family History  Problem Relation Age of Onset  . Tuberculosis Mother   . Parkinsonism Mother   . Diabetes type II Sister   . Breast cancer Sister   . Breast cancer Maternal Aunt    History  Substance Use Topics  . Smoking status: Never Smoker   . Smokeless tobacco: Never Used  . Alcohol Use: Yes     Comment: occasional-red wine   OB History   Grav Para Term Preterm Abortions TAB SAB Ect Mult Living                 Review of Systems  Constitutional: Negative for fever and chills.  Respiratory: Positive for shortness of breath.   Cardiovascular: Negative for chest pain.  Gastrointestinal: Positive for blood in stool and anal bleeding. Negative for nausea, vomiting, abdominal pain, diarrhea and constipation.  All other systems reviewed and are negative.    Allergies  Penicillins  Home Medications   Current Outpatient Rx  Name  Route  Sig  Dispense  Refill  . albuterol (PROVENTIL HFA;VENTOLIN HFA) 108 (90 BASE) MCG/ACT inhaler   Inhalation   Inhale 2 puffs into the lungs every 6 (six) hours as  needed. For shortness of breath   3 Inhaler   1   . budesonide-formoterol (SYMBICORT) 160-4.5 MCG/ACT inhaler   Inhalation   Inhale 2 puffs into the lungs 2 (two) times daily as needed. For shortness of breath   3 Inhaler   1   . chlorpheniramine-HYDROcodone (TUSSIONEX) 10-8 MG/5ML LQCR      take 5 milliliters (1 teaspoonful) by mouth every 12 hours if needed   480 mL   0   . Febuxostat (ULORIC PO)      Take one by mouth daily, pt unsure of strength         . fexofenadine (ALLEGRA) 180 MG tablet   Oral   Take 180 mg by mouth daily.         . fluticasone (FLONASE) 50 MCG/ACT nasal spray   Nasal   Place 2 sprays into the nose daily as needed. For  allergies          . hydroxychloroquine (PLAQUENIL) 200 MG tablet   Oral   Take 200 mg by mouth 2 (two) times daily.          Marland Kitchen leflunomide (ARAVA) 20 MG tablet   Oral   Take 20 mg by mouth daily.         Marland Kitchen lidocaine (LIDODERM) 5 %   Transdermal   Place 1 patch onto the skin daily. Remove & Discard patch within 12 hours or as directed by MD   30 patch   0   . lisinopril (PRINIVIL,ZESTRIL) 10 MG tablet      Take one half (5mg ) daily         . metoprolol succinate (TOPROL-XL) 50 MG 24 hr tablet      take 1 tablet by mouth once daily   30 tablet   6   . montelukast (SINGULAIR) 10 MG tablet   Oral   Take 1 tablet (10 mg total) by mouth at bedtime.   90 tablet   1   . Multiple Vitamin (MULTIVITAMIN) tablet   Oral   Take 1 tablet by mouth daily.           Marland Kitchen nystatin (MYCOSTATIN/NYSTOP) 100000 UNIT/GM POWD      Apply to area three times daily as needed for rash   60 g   0   . oxybutynin (DITROPAN-XL) 10 MG 24 hr tablet   Oral   Take 10 mg by mouth daily as needed.         Marland Kitchen oxyCODONE (ROXICODONE) 5 MG immediate release tablet   Oral   Take 1 tablet (5 mg total) by mouth every 8 (eight) hours as needed.   270 tablet   0   . potassium chloride SA (K-DUR,KLOR-CON) 20 MEQ tablet   Oral   Take 0.5 tablets (10 mEq total) by mouth 2 (two) times daily. May take extra tab when taking torsemide   180 tablet   3   . Rivaroxaban (XARELTO) 20 MG TABS tablet   Oral   Take 1 tablet (20 mg total) by mouth daily.   90 tablet   3   . rosuvastatin (CRESTOR) 10 MG tablet   Oral   Take 1 tablet (10 mg total) by mouth daily.   90 tablet   3   . Specialty Vitamins Products (MAGNESIUM, AMINO ACID CHELATE,) 133 MG tablet   Oral   Take 1 tablet by mouth at bedtime.          Marland Kitchen terazosin (HYTRIN) 10 MG  capsule      TAKE 1 CAPSULE AT BEDTIME   90 capsule   1   . torsemide (DEMADEX) 20 MG tablet   Oral   Take 20 mg by mouth daily as needed.         .  zolpidem (AMBIEN) 10 MG tablet      take 1 tablet by mouth at bedtime   30 tablet   0    BP 164/68  Pulse 67  Temp(Src) 97.9 F (36.6 C) (Oral)  Resp 16  Ht 5\' 4"  (1.626 m)  Wt 360 lb (163.295 kg)  BMI 61.76 kg/m2  SpO2 97% Physical Exam  Nursing note and vitals reviewed. Constitutional: She is oriented to person, place, and time. She appears well-developed and well-nourished. No distress.  HENT:  Head: Normocephalic and atraumatic.  Right Ear: External ear normal.  Left Ear: External ear normal.  Nose: Nose normal.  Mouth/Throat: Oropharynx is clear and moist.  Eyes: Conjunctivae are normal.  Neck: Normal range of motion.  Cardiovascular: Normal rate, regular rhythm and normal heart sounds.   Pulmonary/Chest: Effort normal and breath sounds normal. No stridor. No respiratory distress. She has no wheezes. She has no rales.  Abdominal: Soft. She exhibits no distension. There is no tenderness. There is no rigidity, no rebound and no guarding.  Genitourinary: Rectal exam shows no external hemorrhoid, no fissure, no mass, no tenderness and anal tone normal. Guaiac positive stool.  Dried blood seen on buttocks. No BRBPR on my exam. No melena. Stool is brown. No tenderness with rectal exam.   Musculoskeletal: Normal range of motion.  Neurological: She is alert and oriented to person, place, and time. She has normal strength.  Skin: Skin is warm and dry. She is not diaphoretic. No erythema.  Psychiatric: She has a normal mood and affect. Her behavior is normal.    ED Course  Procedures (including critical care time) Labs Review Labs Reviewed  OCCULT BLOOD, POC DEVICE - Abnormal; Notable for the following:    Fecal Occult Bld POSITIVE (*)    All other components within normal limits  POCT I-STAT, CHEM 8 - Abnormal; Notable for the following:    Calcium, Ion 1.06 (*)    All other components within normal limits   Imaging Review No results found.  EKG Interpretation    None       MDM   1. Rectal bleeding    Pt presents with BRBPR. At the time of my evaluation the bleeding had stopped. No external or thrombosed hemorrhoids seen. No anemia at this time. Patient feels well and is hemodynamically stable. She will follow up with Dr. Loreta Ave, her GI doctor who has seen her for this issue in the past. I discussed reasons to return to the emergency room immediately and patient voiced understanding. I discussed this case with Dr. Elesa Massed who agrees with plan. Patient / Family / Caregiver informed of clinical course, understand medical decision-making process, and agree with plan.    Mora Bellman, PA-C 02/10/13 [phone]

## 2013-02-10 NOTE — ED Notes (Addendum)
Pt is on   New blood thinner and she has had on and off rectal bleeding for months states this am it was in commode and in her undies no bm she states bright red blood no abd pain and her stools before have not been black

## 2013-02-13 ENCOUNTER — Telehealth: Payer: Self-pay

## 2013-02-13 ENCOUNTER — Other Ambulatory Visit: Payer: Self-pay | Admitting: Gastroenterology

## 2013-02-13 NOTE — Telephone Encounter (Signed)
Pt called back for status of tussionex refill; pt advised as noted 01/31/13 Dr Daphine Deutscher response. Pt had had persistent cough her entire life; pt scheduled appt 02/17/13 at 10:30 with Nicki Reaper NP. Pt could not come prior to that appt due to pts schedule. Pt will cb if condition worsens prior to appt.

## 2013-02-13 NOTE — Telephone Encounter (Signed)
Spoke w/ pt.  She reports that she went to ED on Monday w/ GI bleed.  Saw Dr. Loreta Ave today and states that Dr. Loreta Ave instructed her that she needs to speak w/ Dr. Mariah Milling about possibly switching to a different blood thinner. Pt reports that Dr. Loreta Ave does not know cause of bleeding, colonoscopy sched for 03/17/13 at [facility]. Pt reports nose bleed this am.  She will hold xarelto until hearing back from our office. Please advise. Thank you.

## 2013-02-13 NOTE — Telephone Encounter (Signed)
Pt called states she is going to have to stop her Xarelto, states she has been bleeding, her GI, Dr. Loreta Ave, requests she switches to something different. Please call and advise.

## 2013-02-14 NOTE — Telephone Encounter (Signed)
Left detailed message on pt's voicemail.  Asked her to call with any questions or concerns. 

## 2013-02-14 NOTE — Telephone Encounter (Signed)
Would hold xarelto as she has rare episodes of atrial fib Proceed with colonoscopy If she converts back to atrial fib, would call the office immediately Also take extra 1/2 dose metoprolol for new atrial fibrillation

## 2013-02-17 ENCOUNTER — Encounter: Payer: Self-pay | Admitting: Internal Medicine

## 2013-02-17 ENCOUNTER — Ambulatory Visit (INDEPENDENT_AMBULATORY_CARE_PROVIDER_SITE_OTHER): Payer: Federal, State, Local not specified - PPO | Admitting: Internal Medicine

## 2013-02-17 VITALS — BP 132/80 | HR 70 | Temp 97.4°F | Wt 312.5 lb

## 2013-02-17 DIAGNOSIS — R05 Cough: Secondary | ICD-10-CM

## 2013-02-17 MED ORDER — HYDROCOD POLST-CHLORPHEN POLST 10-8 MG/5ML PO LQCR: ORAL | Status: DC

## 2013-02-17 NOTE — Progress Notes (Signed)
Pre-visit discussion using our clinic review tool. No additional management support is needed unless otherwise documented below in the visit note.  

## 2013-02-17 NOTE — Progress Notes (Signed)
Subjective:    Patient ID: Tina Pacheco, female    DOB: 1950/09/09, 62 y.o.   MRN: [id number]  HPI  Pt presents to the clinic today with c/o cough and shortness of breath x 2 weeks. She does report that it has gotten much better over the last few days. She does have a history of chronic cough. She normally takes tucionex but she is out of it. She is here for a refill. She denies fever, chills or body aches. She has had sick contacts.  Review of Systems      Past Medical History  Diagnosis Date  . Hypertension   . Dyslipidemia   . Arrhythmia     paroxysmal atrial fibrillation  . CHF (congestive heart failure)     chronic diastolic  . Morbid obesity   . Hypokalemia   . Coronary artery disease     nonobstructive CAD  . Asthma   . Rheumatoid arthritis(714.0)   . Basal cell carcinoma   . Hyperplastic colonic polyp 2003  . History of gout   . COPD (chronic obstructive pulmonary disease)   . Atrial fibrillation     Current Outpatient Prescriptions  Medication Sig Dispense Refill  . albuterol (PROVENTIL HFA;VENTOLIN HFA) 108 (90 BASE) MCG/ACT inhaler Inhale 2 puffs into the lungs every 6 (six) hours as needed. For shortness of breath  3 Inhaler  1  . budesonide-formoterol (SYMBICORT) 160-4.5 MCG/ACT inhaler Inhale 2 puffs into the lungs 2 (two) times daily as needed. For shortness of breath  3 Inhaler  1  . chlorpheniramine-HYDROcodone (TUSSIONEX) 10-8 MG/5ML LQCR take 5 milliliters (1 teaspoonful) by mouth every 12 hours if needed  480 mL  0  . Febuxostat (ULORIC PO) Take one by mouth daily, pt unsure of strength      . fexofenadine (ALLEGRA) 180 MG tablet Take 180 mg by mouth daily.      . fluticasone (FLONASE) 50 MCG/ACT nasal spray Place 2 sprays into the nose daily as needed. For allergies       . hydroxychloroquine (PLAQUENIL) 200 MG tablet Take 200 mg by mouth 2 (two) times daily.       Marland Kitchen leflunomide (ARAVA) 20 MG tablet Take 20 mg by mouth daily.      Marland Kitchen lisinopril  (PRINIVIL,ZESTRIL) 10 MG tablet Take one half (5mg ) daily      . metoprolol succinate (TOPROL-XL) 50 MG 24 hr tablet take 1 tablet by mouth once daily  30 tablet  6  . montelukast (SINGULAIR) 10 MG tablet Take 1 tablet (10 mg total) by mouth at bedtime.  90 tablet  1  . Multiple Vitamin (MULTIVITAMIN) tablet Take 1 tablet by mouth daily.        Marland Kitchen nystatin (MYCOSTATIN/NYSTOP) 100000 UNIT/GM POWD Apply to area three times daily as needed for rash  60 g  0  . oxybutynin (DITROPAN-XL) 10 MG 24 hr tablet Take 10 mg by mouth daily as needed.      Marland Kitchen oxyCODONE (ROXICODONE) 5 MG immediate release tablet Take 1 tablet (5 mg total) by mouth every 8 (eight) hours as needed.  270 tablet  0  . potassium chloride SA (K-DUR,KLOR-CON) 20 MEQ tablet Take 0.5 tablets (10 mEq total) by mouth 2 (two) times daily. May take extra tab when taking torsemide  180 tablet  3  . pregabalin (LYRICA) 75 MG capsule Take 75 mg by mouth 2 (two) times daily.      . rosuvastatin (CRESTOR) 10 MG tablet Take 1  tablet (10 mg total) by mouth daily.  90 tablet  3  . Specialty Vitamins Products (MAGNESIUM, AMINO ACID CHELATE,) 133 MG tablet Take 1 tablet by mouth at bedtime.       Marland Kitchen terazosin (HYTRIN) 10 MG capsule Take 10 mg by mouth at bedtime.      . torsemide (DEMADEX) 20 MG tablet Take 20 mg by mouth daily as needed. Edema      . zolpidem (AMBIEN) 10 MG tablet take 1 tablet by mouth at bedtime  30 tablet  0  . [DISCONTINUED] oxybutynin (DITROPAN-XL) 10 MG 24 hr tablet Take 10 mg by mouth daily.        No current facility-administered medications for this visit.    Allergies  Allergen Reactions  . Penicillins     Childhood allergies. But has tolerated amoxicillin and other cillins since     Family History  Problem Relation Age of Onset  . Tuberculosis Mother   . Parkinsonism Mother   . Diabetes type II Sister   . Breast cancer Sister   . Breast cancer Maternal Aunt     History   Social History  . Marital Status:  Married    Spouse Name: N/A    Number of Children: N/A  . Years of Education: N/A   Occupational History  . Not on file.   Social History Main Topics  . Smoking status: Never Smoker   . Smokeless tobacco: Never Used  . Alcohol Use: Yes     Comment: occasional-red wine  . Drug Use: No  . Sexual Activity: Not on file   Other Topics Concern  . Not on file   Social History Narrative  . No narrative on file     Constitutional: Denies fever, malaise, fatigue, headache or abrupt weight changes.  HEENT: Denies eye pain, eye redness, ear pain, ringing in the ears, wax buildup, runny nose, nasal congestion, bloody nose, or sore throat. Respiratory: Denies difficulty breathing, or sputum production.   Cardiovascular: Denies chest pain, chest tightness, palpitations or swelling in the hands or feet.   No other specific complaints in a complete review of systems (except as listed in HPI above).  Objective:   Physical Exam   BP 132/80  Pulse 70  Temp(Src) 97.4 F (36.3 C) (Oral)  Wt 312 lb 8 oz (141.749 kg)  SpO2 98% Wt Readings from Last 3 Encounters:  02/17/13 312 lb 8 oz (141.749 kg)  02/10/13 360 lb (163.295 kg)  12/25/12 315 lb 4 oz (142.996 kg)    General: Appears her stated age, obese but well developed, well nourished in NAD. HEENT: Head: normal shape and size; Eyes: sclera white, no icterus, conjunctiva pink, PERRLA and EOMs intact; Ears: Tm's gray and intact, normal light reflex; Nose: mucosa pink and moist, septum midline; Throat/Mouth: Teeth present, mucosa pink and moist, no exudate, lesions or ulcerations noted.  Neck: Normal range of motion. Neck supple, trachea midline. No massses, lumps or thyromegaly present.  Cardiovascular: Normal rate and rhythm. S1,S2 noted.  No murmur, rubs or gallops noted. No JVD or BLE edema. No carotid bruits noted. Pulmonary/Chest: Normal effort and positive vesicular breath sounds. No respiratory distress. No wheezes, rales or ronchi  noted.   BMET    Component Value Date/Time   NA 141 02/10/2013 1418   NA 136 06/24/2012 1142   K 4.3 02/10/2013 1418   CL 108 02/10/2013 1418   CO2 29 11/20/2012 1126   GLUCOSE 91 02/10/2013 1418   GLUCOSE  99 06/24/2012 1142   BUN 14 02/10/2013 1418   BUN 24 06/24/2012 1142   CREATININE 0.90 02/10/2013 1418   CREATININE 0.84 10/26/2010 1037   CALCIUM 10.0 11/20/2012 1126   GFRNONAA 53* 06/24/2012 1142   GFRAA 62 06/24/2012 1142    Lipid Panel     Component Value Date/Time   CHOL 198 11/20/2012 1126   TRIG 90.0 11/20/2012 1126   HDL 72.50 11/20/2012 1126   CHOLHDL 3 11/20/2012 1126   VLDL 18.0 11/20/2012 1126   LDLCALC 108* 11/20/2012 1126    CBC    Component Value Date/Time   WBC 4.6 08/19/2012 0807   WBC 4.4 07/05/2012 1120   RBC 3.90 08/19/2012 0807   RBC 3.58* 07/05/2012 1120   HGB 12.9 02/10/2013 1418   HCT 38.0 02/10/2013 1418   PLT 188.0 08/19/2012 0807   MCV 92.0 08/19/2012 0807   MCH 28.5 07/05/2012 1120   MCH 29.1 12/16/2011 0829   MCHC 32.7 08/19/2012 0807   MCHC 33.4 07/05/2012 1120   RDW 18.1* 08/19/2012 0807   RDW 16.9* 07/05/2012 1120   LYMPHSABS 2.3 08/19/2012 0807   LYMPHSABS 2.0 07/05/2012 1120   MONOABS 0.4 08/19/2012 0807   EOSABS 0.5 08/19/2012 0807   EOSABS 0.2 07/05/2012 1120   BASOSABS 0.1 08/19/2012 0807   BASOSABS 0.0 07/05/2012 1120    Hgb A1C Lab Results  Component Value Date   HGBA1C  Value: 6.3 (NOTE)                                                                       According to the ADA Clinical Practice Recommendations for 2011, when HbA1c is used as a screening test:   >=6.5%   Diagnostic of Diabetes Mellitus           (if abnormal result  is confirmed)  5.7-6.4%   Increased risk of developing Diabetes Mellitus  References:Diagnosis and Classification of Diabetes Mellitus,Diabetes Care,2011,34(Suppl 1):S62-S69 and Standards of Medical Care in         Diabetes - 2011,Diabetes Care,2011,34  (Suppl 1):S11-S61.* 05/24/2010        Assessment & Plan:

## 2013-02-17 NOTE — Patient Instructions (Signed)

## 2013-02-17 NOTE — Assessment & Plan Note (Signed)
Will refill tussionex today

## 2013-02-23 ENCOUNTER — Other Ambulatory Visit: Payer: Self-pay | Admitting: Cardiovascular Disease

## 2013-02-25 ENCOUNTER — Other Ambulatory Visit: Payer: Self-pay | Admitting: *Deleted

## 2013-02-25 MED ORDER — POTASSIUM CHLORIDE CRYS ER 20 MEQ PO TBCR: 10.0000 meq | EXTENDED_RELEASE_TABLET | Freq: Two times a day (BID) | ORAL | Status: DC

## 2013-02-25 NOTE — Telephone Encounter (Signed)
Requested Prescriptions   Signed Prescriptions Disp Refills  . potassium chloride SA (K-DUR,KLOR-CON) 20 MEQ tablet 180 tablet 3    Sig: Take 0.5 tablets (10 mEq total) by mouth 2 (two) times daily. May take extra tab when taking torsemide    Authorizing Provider: Antonieta Iba    Ordering User: Kendrick Fries

## 2013-02-26 ENCOUNTER — Other Ambulatory Visit: Payer: Self-pay

## 2013-02-26 ENCOUNTER — Telehealth: Payer: Self-pay

## 2013-02-26 MED ORDER — POTASSIUM CHLORIDE CRYS ER 20 MEQ PO TBCR: 10.0000 meq | EXTENDED_RELEASE_TABLET | Freq: Two times a day (BID) | ORAL | Status: DC

## 2013-02-26 NOTE — Telephone Encounter (Signed)
Refill sent for potassium 20 meq  

## 2013-02-26 NOTE — Telephone Encounter (Signed)
Pt states "[facility] Called regarding Potassium refill, and they state it has been declined". Please call and advise what the problem is.

## 2013-02-26 NOTE — Telephone Encounter (Signed)
Notified patient we will take care of the refill for potassium.

## 2013-02-27 ENCOUNTER — Other Ambulatory Visit: Payer: Self-pay | Admitting: Family Medicine

## 2013-02-28 NOTE — Telephone Encounter (Signed)
Rx called in to requested pharmacy 

## 2013-02-28 NOTE — Telephone Encounter (Signed)
Pt requesting medication refill. Last ov 10/2012. pls advise

## 2013-03-03 ENCOUNTER — Encounter: Payer: Self-pay | Admitting: Family Medicine

## 2013-03-07 ENCOUNTER — Telehealth: Payer: Self-pay

## 2013-03-07 NOTE — Telephone Encounter (Signed)
Faxed cardiac clearance for surgery to [place] at [phone], per Dr. Rockey Situ: "Yes, ok to stop xarelto 2 days prior, last dose 03/14/13)" before colonoscopy w/ Dr. Collene Mares.

## 2013-03-17 ENCOUNTER — Ambulatory Visit (HOSPITAL_COMMUNITY)
Admission: RE | Admit: 2013-03-17 | Discharge: 2013-03-17 | Disposition: A | Discharge status: Home / Self Care | Payer: Federal, State, Local not specified - PPO | Source: Ambulatory Visit | Attending: Gastroenterology | Admitting: Gastroenterology

## 2013-03-17 ENCOUNTER — Encounter (HOSPITAL_COMMUNITY)
Admission: RE | Disposition: A | Discharge status: Home / Self Care | Payer: Self-pay | Source: Ambulatory Visit | Attending: Gastroenterology

## 2013-03-17 ENCOUNTER — Other Ambulatory Visit: Payer: Self-pay | Admitting: Family Medicine

## 2013-03-17 ENCOUNTER — Encounter (HOSPITAL_COMMUNITY): Payer: Self-pay | Admitting: *Deleted

## 2013-03-17 DIAGNOSIS — Z860100 Personal history of colon polyps, unspecified: Secondary | ICD-10-CM | POA: Insufficient documentation

## 2013-03-17 DIAGNOSIS — J45909 Unspecified asthma, uncomplicated: Secondary | ICD-10-CM | POA: Insufficient documentation

## 2013-03-17 DIAGNOSIS — I4891 Unspecified atrial fibrillation: Secondary | ICD-10-CM | POA: Insufficient documentation

## 2013-03-17 DIAGNOSIS — Z9089 Acquired absence of other organs: Secondary | ICD-10-CM | POA: Insufficient documentation

## 2013-03-17 DIAGNOSIS — J4489 Other specified chronic obstructive pulmonary disease: Secondary | ICD-10-CM | POA: Insufficient documentation

## 2013-03-17 DIAGNOSIS — I10 Essential (primary) hypertension: Secondary | ICD-10-CM | POA: Insufficient documentation

## 2013-03-17 DIAGNOSIS — M069 Rheumatoid arthritis, unspecified: Secondary | ICD-10-CM | POA: Insufficient documentation

## 2013-03-17 DIAGNOSIS — I509 Heart failure, unspecified: Secondary | ICD-10-CM | POA: Insufficient documentation

## 2013-03-17 DIAGNOSIS — K625 Hemorrhage of anus and rectum: Secondary | ICD-10-CM | POA: Insufficient documentation

## 2013-03-17 DIAGNOSIS — I2510 Atherosclerotic heart disease of native coronary artery without angina pectoris: Secondary | ICD-10-CM | POA: Insufficient documentation

## 2013-03-17 DIAGNOSIS — E785 Hyperlipidemia, unspecified: Secondary | ICD-10-CM | POA: Insufficient documentation

## 2013-03-17 DIAGNOSIS — J449 Chronic obstructive pulmonary disease, unspecified: Secondary | ICD-10-CM | POA: Insufficient documentation

## 2013-03-17 DIAGNOSIS — Z79899 Other long term (current) drug therapy: Secondary | ICD-10-CM | POA: Insufficient documentation

## 2013-03-17 HISTORY — PX: COLONOSCOPY: SHX5424

## 2013-03-17 SURGERY — COLONOSCOPY
Anesthesia: Moderate Sedation

## 2013-03-17 MED ORDER — MIDAZOLAM HCL 10 MG/2ML IJ SOLN
INTRAMUSCULAR | Status: DC | PRN
  Administered 2013-03-17: 1 mg via INTRAVENOUS
  Administered 2013-03-17: 2 mg via INTRAVENOUS
  Administered 2013-03-17: 1 mg via INTRAVENOUS
  Administered 2013-03-17: 2 mg via INTRAVENOUS
  Administered 2013-03-17: 1 mg via INTRAVENOUS
  Administered 2013-03-17: 2 mg via INTRAVENOUS

## 2013-03-17 MED ORDER — SODIUM CHLORIDE 0.9 % IV SOLN
INTRAVENOUS | Status: DC
  Administered 2013-03-17: 500 mL via INTRAVENOUS

## 2013-03-17 MED ORDER — MIDAZOLAM HCL 10 MG/2ML IJ SOLN
INTRAMUSCULAR | Status: AC
  Filled 2013-03-17: qty 2

## 2013-03-17 MED ORDER — FENTANYL CITRATE 0.05 MG/ML IJ SOLN
INTRAMUSCULAR | Status: AC
  Filled 2013-03-17: qty 2

## 2013-03-17 MED ORDER — DIPHENHYDRAMINE HCL 50 MG/ML IJ SOLN
INTRAMUSCULAR | Status: AC
  Filled 2013-03-17: qty 1

## 2013-03-17 MED ORDER — FENTANYL CITRATE 0.05 MG/ML IJ SOLN
INTRAMUSCULAR | Status: DC | PRN
  Administered 2013-03-17 (×4): 25 ug via INTRAVENOUS

## 2013-03-17 NOTE — H&P (Signed)
Tina Pacheco is an 63 y.o. female.   Chief Complaint: Rectal bleeding. HPI: Patient is here for further evaluation of rectal bleeding. See office notes for further details.   Past Medical History  Diagnosis Date  . Hypertension   . Dyslipidemia   . Arrhythmia     paroxysmal atrial fibrillation  . CHF (congestive heart failure)     chronic diastolic  . Morbid obesity   . Hypokalemia   . Coronary artery disease     nonobstructive CAD  . Asthma   . Rheumatoid arthritis(714.0)   . Basal cell carcinoma   . Hyperplastic colonic polyp 2003  . History of gout   . COPD (chronic obstructive pulmonary disease)   . Atrial fibrillation    Past Surgical History  Procedure Laterality Date  . Cardiac catheterization  05/24/2010    nonobstructive CAD  . Knee arthroscopy      bilateral  . Vaginal hysterectomy    . Cholecystectomy    . Cesarean section    . Colonoscopy  06/12/2011    Procedure: COLONOSCOPY;  Surgeon: Juanita Craver, MD;  Location: [facility];  Service: Endoscopy;  Laterality: N/A;   Family History  Problem Relation Age of Onset  . Tuberculosis Mother   . Parkinsonism Mother   . Diabetes type II Sister   . Breast cancer Sister   . Breast cancer Maternal Aunt    Social History:  reports that she has never smoked. She has never used smokeless tobacco. She reports that she drinks alcohol. She reports that she does not use illicit drugs.  Allergies:  Allergies  Allergen Reactions  . Penicillins     Childhood allergies. But has tolerated amoxicillin and other cillins since    Medications Prior to Admission  Medication Sig Dispense Refill  . albuterol (PROVENTIL HFA;VENTOLIN HFA) 108 (90 BASE) MCG/ACT inhaler Inhale 2 puffs into the lungs every 6 (six) hours as needed. For shortness of breath  3 Inhaler  1  . budesonide-formoterol (SYMBICORT) 160-4.5 MCG/ACT inhaler Inhale 2 puffs into the lungs 2 (two) times daily as needed. For shortness of breath  3 Inhaler  1  .  chlorpheniramine-HYDROcodone (TUSSIONEX) 10-8 MG/5ML LQCR take 5 milliliters (1 teaspoonful) by mouth every 12 hours if needed  480 mL  0  . Febuxostat (ULORIC PO) Take one by mouth daily, pt unsure of strength      . fexofenadine (ALLEGRA) 180 MG tablet Take 180 mg by mouth daily.      . fluticasone (FLONASE) 50 MCG/ACT nasal spray Place 2 sprays into the nose daily as needed. For allergies       . hydroxychloroquine (PLAQUENIL) 200 MG tablet Take 200 mg by mouth 2 (two) times daily.       Marland Kitchen KLOR-CON M20 20 MEQ tablet TAKE 1 TABLET TWICE A DAY, MAY TAKE EXTRA TABLET WHEN TAKING TORSEMIDE  180 tablet  3  . leflunomide (ARAVA) 20 MG tablet Take 20 mg by mouth daily.      Marland Kitchen lisinopril (PRINIVIL,ZESTRIL) 10 MG tablet Take one half (5mg ) daily      . metoprolol succinate (TOPROL-XL) 50 MG 24 hr tablet take 1 tablet by mouth once daily  30 tablet  6  . montelukast (SINGULAIR) 10 MG tablet Take 1 tablet (10 mg total) by mouth at bedtime.  90 tablet  1  . Multiple Vitamin (MULTIVITAMIN) tablet Take 1 tablet by mouth daily.        Marland Kitchen nystatin (MYCOSTATIN/NYSTOP) 100000 UNIT/GM POWD  Apply to area three times daily as needed for rash  60 g  0  . oxybutynin (DITROPAN-XL) 10 MG 24 hr tablet Take 10 mg by mouth daily as needed.      Marland Kitchen oxyCODONE (ROXICODONE) 5 MG immediate release tablet Take 1 tablet (5 mg total) by mouth every 8 (eight) hours as needed.  270 tablet  0  . potassium chloride SA (K-DUR,KLOR-CON) 20 MEQ tablet Take 0.5 tablets (10 mEq total) by mouth 2 (two) times daily. May take extra tab when taking torsemide  180 tablet  3  . pregabalin (LYRICA) 75 MG capsule Take 75 mg by mouth 2 (two) times daily.      . Rivaroxaban (XARELTO) 15 MG TABS tablet Take 15 mg by mouth once.      . rosuvastatin (CRESTOR) 10 MG tablet Take 1 tablet (10 mg total) by mouth daily.  90 tablet  3  . Specialty Vitamins Products (MAGNESIUM, AMINO ACID CHELATE,) 133 MG tablet Take 1 tablet by mouth at bedtime.       Marland Kitchen  terazosin (HYTRIN) 10 MG capsule Take 10 mg by mouth at bedtime.      Marland Kitchen zolpidem (AMBIEN) 10 MG tablet take 1 tablet by mouth at bedtime  30 tablet  0  . torsemide (DEMADEX) 20 MG tablet Take 20 mg by mouth daily as needed. Edema       Review of Systems  Constitutional: Negative.   Eyes: Negative.   Respiratory: Positive for shortness of breath. Negative for cough, hemoptysis, sputum production and wheezing.   Gastrointestinal: Positive for blood in stool. Negative for heartburn, nausea, vomiting, abdominal pain, diarrhea, constipation and melena.  Genitourinary: Positive for frequency. Negative for urgency, hematuria and flank pain.  Musculoskeletal: Positive for back pain, joint pain and myalgias.  Neurological: Negative.   Psychiatric/Behavioral: Negative.    Blood pressure 182/104, pulse 90, temperature 98.2 F (36.8 C), temperature source Oral, resp. rate 15, height 5\' 4"  (1.626 m), weight 141.522 kg (312 lb), SpO2 95.00%. Physical Exam  Constitutional: She is oriented to person, place, and time. She appears well-developed and well-nourished.  HENT:  Head: Normocephalic and atraumatic.  Eyes: Conjunctivae and EOM are normal. Pupils are equal, round, and reactive to light.  Neck: Normal range of motion. Neck supple.  Cardiovascular: Normal rate and regular rhythm.   Respiratory: Effort normal and breath sounds normal.  GI: Soft. Bowel sounds are normal. She exhibits no distension and no mass. There is no tenderness. There is no rebound and no guarding.  Musculoskeletal: Normal range of motion.  Neurological: She is alert and oriented to person, place, and time.  Skin: Skin is warm and dry.  Psychiatric: She has a normal mood and affect. Her behavior is normal. Judgment and thought content normal.    Assessment/Plan Rectal bleeding/CRC screening: proceed with a colonoscopy at this time.   Heidi Lemay 03/17/2013, 2:44 PM

## 2013-03-17 NOTE — Discharge Instructions (Addendum)
Colonoscopy, Care After Refer to this sheet in the next few weeks. These instructions provide you with information on caring for yourself after your procedure. Your health care provider may also give you more specific instructions. Your treatment has been planned according to current medical practices, but problems sometimes occur. Call your health care provider if you have any problems or questions after your procedure. WHAT TO EXPECT AFTER THE PROCEDURE  After your procedure, it is typical to have the following:  A small amount of blood in your stool.  Moderate amounts of gas and mild abdominal cramping or bloating. HOME CARE INSTRUCTIONS  Do not drive, operate machinery, or sign important documents for 24 hours.  You may shower and resume your regular physical activities, but move at a slower pace for the first 24 hours.  Take frequent rest periods for the first 24 hours.  Walk around or put a warm pack on your abdomen to help reduce abdominal cramping and bloating.  Drink enough fluids to keep your urine clear or pale yellow.  You may resume your normal diet as instructed by your health care provider. Avoid heavy or fried foods that are hard to digest.  Avoid drinking alcohol for 24 hours or as instructed by your health care provider.  Only take over-the-counter or prescription medicines as directed by your health care provider.  If a tissue sample (biopsy) was taken during your procedure:  Do not take aspirin or blood thinners for 7 days, or as instructed by your health care provider.  Do not drink alcohol for 7 days, or as instructed by your health care provider.  Eat soft foods for the first 24 hours. SEEK MEDICAL CARE IF: You have persistent spotting of blood in your stool 2 3 days after the procedure. SEEK IMMEDIATE MEDICAL CARE IF:  You have more than a small spotting of blood in your stool.  You pass large blood clots in your stool.  Your abdomen is swollen  (distended).  You have nausea or vomiting.  You have a fever.  You have increasing abdominal pain that is not relieved with medicine. Document Released: 09/28/2003 Document Revised: 12/04/2012 Document Reviewed: 10/21/2012 ExitCare Patient Information 2014 [employer]. Colonoscopy A colonoscopy is an exam to look at the entire large intestine (colon). This exam can help find problems such as tumors, polyps, inflammation, and areas of bleeding. The exam takes about 1 hour.  LET YOUR HEALTH CARE PROVIDER KNOW ABOUT:   Any allergies you have.  All medicines you are taking, including vitamins, herbs, eye drops, creams, and over-the-counter medicines.  Previous problems you or members of your family have had with the use of anesthetics.  Any blood disorders you have.  Previous surgeries you have had.  Medical conditions you have. RISKS AND COMPLICATIONS  Generally, this is a safe procedure. However, as with any procedure, complications can occur. Possible complications include:  Bleeding.  Tearing or rupture of the colon wall.  Reaction to medicines given during the exam.  Infection (rare). BEFORE THE PROCEDURE   Ask your health care provider about changing or stopping your regular medicines.  You may be prescribed an oral bowel prep. This involves drinking a large amount of medicated liquid, starting the day before your procedure. The liquid will cause you to have multiple loose stools until your stool is almost clear or light green. This cleans out your colon in preparation for the procedure.  Do not eat or drink anything else once you have started the   bowel prep, unless your health care provider tells you it is safe to do so.  Arrange for someone to drive you home after the procedure. PROCEDURE   You will be given medicine to help you relax (sedative).  You will lie on your side with your knees bent.  A long, flexible tube with a light and camera on the end  (colonoscope) will be inserted through the rectum and into the colon. The camera sends video back to a computer screen as it moves through the colon. The colonoscope also releases carbon dioxide gas to inflate the colon. This helps your health care provider see the area better.  During the exam, your health care provider may take a small tissue sample (biopsy) to be examined under a microscope if any abnormalities are found.  The exam is finished when the entire colon has been viewed. AFTER THE PROCEDURE   Do not drive for 24 hours after the exam.  You may have a small amount of blood in your stool.  You may pass moderate amounts of gas and have mild abdominal cramping or bloating. This is caused by the gas used to inflate your colon during the exam.  Ask when your test results will be ready and how you will get your results. Make sure you get your test results. Document Released: 02/11/2000 Document Revised: 12/04/2012 Document Reviewed: 10/21/2012 ExitCare Patient Information 2014 [employer].  

## 2013-03-17 NOTE — Op Note (Signed)
[facility] [place], [address]   OPERATIVE PROCEDURE REPORT  PATIENT: Tina Pacheco, Tina Pacheco  MR#: [id number] BIRTHDATE: 02-Jan-1951 GENDER: Female ENDOSCOPIST:  Edmonia James, MD ASSISTANT:   William Dalton, technician Elna Breslow, RN PROCEDURE DATE: 03/17/2013 PRE-PROCEDURE PREPARATION: The patient was prepped with a gallon of Golytely the night prior to the procedure.  The patient was fasted for 4 hours prior to the procedure.  PRE-PROCEDURE PHYSICAL: Patient has stable vital signs.  Neck is supple.  There is no JVD, thyromegaly or LAD.  Chest clear to auscultation.  S1 and S2 regular.  Abdomen soft, morbid obesity, non-distended, non-tender with NABS. PROCEDURE:     Colonoscopy, diagnostic ASA CLASS:     Class IV INDICATIONS:     1. Rectal bleeding.  2.Colorectal cancer screening.  MEDICATIONS:     Fentanyl 100 mcg  and Versed 9 mg IV.  DESCRIPTION OF PROCEDURE: After the risks, benefits, and alternatives of the procedure were thoroughly explained [including a 10% missed rate of cancer and polyps], informed consent was obtained. Digital rectal exam was performed. The Pentax Slim Colonicsope [id number])  was introduced through the anus  and advanced to the cecum, which was identified by both the appendix and ileocecal valve , limited by No adverse events experienced. The quality of the prep was adequate, using [place] . Multiple washes were done. Small lesions could be missed. The instrument was then slowly withdrawn as the colon was fully examined.     COLON FINDINGS: A normal appearing cecum, ileocecal valve, and appendiceal orifice were identified.  The ascending, hepatic flexure, transverse, splenic flexure, descending, sigmoid colon and rectum appeared unremarkable.  No polyps or cancers were seen. Small internal hemorrhoids were found.  The entire colonic mucosa appeared healthy with a normal vascular pattern.  No masses, polyps,  diverticula or AVMs were noted.  The appendiceal orifice and the ICV were identified and photographed. Retroflexed views revealed small internal hemorrhoids. The patient tolerated the procedure without immediate complications.  The scope was then withdrawn from the patient and the procedure terminated.  TIME TO CECUM:   4 minutes 00 seconds WITHDRAW TIME:  6 minutes 00 seconds  IMPRESSION:     1.  Normal appearing colon upto the cecum except for small internal hemorrhoids.  RECOMMENDATIONS:     1.  Continue current medications 2.  High fiber diet with liberal fluid intake.  REPEAT EXAM:      for colonoscopy in 10 years.  If the patient has any abnormal GI symptoms in the interim, she have been advised to contact the office as soon as possible for further recommendations.    CPT CODES:     [id number], Screening Colonoscopy   DIAGNOSIS CODES:     569.3, rectal bleeding V76.51 Colorectal cancer screening   REFERRED ZD:GLOVF Deborra Medina, M.D.  Ida Rogue, M.D.  eSigned:  Dr. Edmonia James, MD 03/17/2013 4:02 PM   PATIENT NAME:  [name], [name] MR#: [id number]

## 2013-03-18 ENCOUNTER — Encounter (HOSPITAL_COMMUNITY): Payer: Self-pay | Admitting: Gastroenterology

## 2013-03-18 NOTE — Telephone Encounter (Signed)
Pt requesting medication refill. Last ov 10/2012 with no future appts scheduled. Medication listed in Hx only. pls advise

## 2013-03-27 ENCOUNTER — Other Ambulatory Visit: Payer: Self-pay | Admitting: Family Medicine

## 2013-03-28 NOTE — Telephone Encounter (Signed)
Pt requesting medication refill. Last ov 01/2013 with no future appts scheduled. pls advise

## 2013-03-28 NOTE — Telephone Encounter (Signed)
Spoke to pt and informed her Rx has been phoned into requested pharmacy 

## 2013-04-01 ENCOUNTER — Telehealth: Payer: Self-pay

## 2013-04-01 NOTE — Telephone Encounter (Signed)
Spoke w/ pt.  She reports that she had her colonoscopy w/ no problems.   Reports that they found 1 polyp, but no reason for her bleeding. Pt states that she does not want to go back on the xarelto. She was previously on coumadin, but could not afford the frequent monitoring.  She would like "something more inexpensive". States that she is unsure if she even needs to be on a blood thinner, as she had to take an extra metoprolol a couple of weeks ago b/c she "felt funny". Reports the last time that she felt symptomatic before that was in Nov 2013. Pt would like Dr. Rockey Situ would recommend another med for her.  Please advise. Thank you.

## 2013-04-01 NOTE — Telephone Encounter (Signed)
Unfortunately no an extensive alternative apart from warfarin All of the others are relatively expensive He would be her choice if she would like to take a blood thinner such as xarelto , or pradaxa or eliquis  When she has symptoms only And take aspirin on other days There is a risk that she might have atrial fibrillation and not realize it and put herself at risk Certainly her choice as to what she does as these medications are expensive

## 2013-04-01 NOTE — Telephone Encounter (Signed)
Pt called and wants to discuss her Xarelto. Pt states she is not going back on Xarelto. Please call.

## 2013-04-02 NOTE — Telephone Encounter (Signed)
Spoke w/ pt.  She is interested in possibly trying Eliquis, but would like to speak w/ her ins co first. She will call back and let me know if she would like an rx called in and some samples left up front for her.

## 2013-04-03 ENCOUNTER — Telehealth: Payer: Self-pay | Admitting: *Deleted

## 2013-04-03 NOTE — Telephone Encounter (Signed)
Spoke w/ pt.  She reports that Eliquis is a tier 4 and will cost her about $100/month. She is interested in trying pradaxa, as it is tier 2 and cheaper.  Advised pt that I would find out what strength Dr. Rockey Situ would like her to be on and I will leave samples at the front desk for her to pick up. She is agreeable to this.

## 2013-04-03 NOTE — Telephone Encounter (Signed)
Please call patient she has several medication questions.

## 2013-04-03 NOTE — Telephone Encounter (Signed)
Spoke w/ pt.  She states that is aware that pradaxa is taken twice a day and that it will still be cheaper for her.  She asked that I wait to send the rx in until after she has tried the samples, as her pharmacy will charge her.  Samples of pradaxa 150mg  left at the front desk for pt to pick up.

## 2013-04-14 ENCOUNTER — Encounter: Payer: Self-pay | Admitting: Radiology

## 2013-04-15 ENCOUNTER — Ambulatory Visit: Payer: Federal, State, Local not specified - PPO | Admitting: Family Medicine

## 2013-04-16 ENCOUNTER — Encounter: Payer: Self-pay | Admitting: Family Medicine

## 2013-04-16 ENCOUNTER — Ambulatory Visit (INDEPENDENT_AMBULATORY_CARE_PROVIDER_SITE_OTHER): Payer: Federal, State, Local not specified - PPO | Admitting: Family Medicine

## 2013-04-16 VITALS — BP 138/78 | HR 75 | Temp 97.6°F | Wt 329.0 lb

## 2013-04-16 DIAGNOSIS — M25561 Pain in right knee: Secondary | ICD-10-CM

## 2013-04-16 DIAGNOSIS — R059 Cough, unspecified: Secondary | ICD-10-CM

## 2013-04-16 DIAGNOSIS — J45909 Unspecified asthma, uncomplicated: Secondary | ICD-10-CM | POA: Insufficient documentation

## 2013-04-16 DIAGNOSIS — M25512 Pain in left shoulder: Secondary | ICD-10-CM

## 2013-04-16 DIAGNOSIS — R053 Chronic cough: Secondary | ICD-10-CM

## 2013-04-16 DIAGNOSIS — M25511 Pain in right shoulder: Secondary | ICD-10-CM | POA: Insufficient documentation

## 2013-04-16 DIAGNOSIS — R05 Cough: Secondary | ICD-10-CM

## 2013-04-16 DIAGNOSIS — M25569 Pain in unspecified knee: Secondary | ICD-10-CM

## 2013-04-16 MED ORDER — PREGABALIN 300 MG PO CAPS: 300.0000 mg | ORAL_CAPSULE | Freq: Every day | ORAL | Status: DC

## 2013-04-16 MED ORDER — OXYCODONE HCL 5 MG PO TABS: 5.0000 mg | ORAL_TABLET | Freq: Three times a day (TID) | ORAL | Status: DC | PRN

## 2013-04-16 MED ORDER — HYDROCOD POLST-CHLORPHEN POLST 10-8 MG/5ML PO LQCR: ORAL | Status: DC

## 2013-04-16 NOTE — Assessment & Plan Note (Signed)
Likely DJD worsened acutely by her recent weight gain. Advised getting new orthotics, working with orth for steroid injections. Rx for oxycodone refilled today.

## 2013-04-16 NOTE — Progress Notes (Signed)
Pre-visit discussion using our clinic review tool. No additional management support is needed unless otherwise documented below in the visit note.  

## 2013-04-16 NOTE — Assessment & Plan Note (Signed)
Chronic issues.  Lung exam again benign and reassuring. Tussionex refilled.

## 2013-04-16 NOTE — Patient Instructions (Signed)
Great to see you. Please update me with your symptoms.

## 2013-04-16 NOTE — Progress Notes (Signed)
Patient ID: Tina Pacheco, female    DOB: 25-Feb-1951, 63 y.o.   MRN: [id number]  HPI Comments: [name] is a 63 year old with complicated medical history, including  paroxysmal atrial fibrillation here for chronic cough deterioration.  Chronic cough- has episodes of tickle in her throat that leads to terrible cough, often has post tussive emesis. H/o allergic rhinitis/asthma- currently taking flonase, singulair, allegra and symbicort.   CXRs have been neg.  Tussionex has really helped. Would like refill today.  No CP.   No fevers or chills.    No difficulty swallowing.  Neck pain- h/o DJD of cervical spine.  Has had some flares lately.  She is working on weight loss which has improved her back pain in general. Wt Readings from Last 3 Encounters:  04/16/13 329 lb (149.233 kg)  03/17/13 312 lb (141.522 kg)  03/17/13 312 lb (141.522 kg)   Knee pain- DJD/ RA- bilaterally, right > left.  Seeing Dr. Amil Amen (rheum).  Wearing orthotics and she is aware her weight is an issue.  Tries not to take oxycodone but sometimes she has to.  Lyrica recently increased to 300 mg a day which has also helped significantly.  Outpatient Encounter Prescriptions as of 04/16/2013  Medication Sig  . albuterol (PROVENTIL HFA;VENTOLIN HFA) 108 (90 BASE) MCG/ACT inhaler Inhale 2 puffs into the lungs every 6 (six) hours as needed. For shortness of breath  . budesonide-formoterol (SYMBICORT) 160-4.5 MCG/ACT inhaler Inhale 2 puffs into the lungs 2 (two) times daily as needed. For shortness of breath  . chlorpheniramine-HYDROcodone (TUSSIONEX) 10-8 MG/5ML LQCR take 5 milliliters (1 teaspoonful) by mouth every 12 hours if needed  . dabigatran (PRADAXA) 150 MG CAPS capsule Take 150 mg by mouth 2 (two) times daily.  . Febuxostat (ULORIC PO) Take one by mouth daily, pt unsure of strength  . fexofenadine (ALLEGRA) 180 MG tablet Take 180 mg by mouth daily.  . fluticasone (FLONASE) 50 MCG/ACT nasal spray Place 2 sprays  into the nose daily as needed. For allergies   . hydroxychloroquine (PLAQUENIL) 200 MG tablet Take 200 mg by mouth 2 (two) times daily.   Marland Kitchen KLOR-CON M20 20 MEQ tablet TAKE 1 TABLET TWICE A DAY, MAY TAKE EXTRA TABLET WHEN TAKING TORSEMIDE  . leflunomide (ARAVA) 20 MG tablet Take 20 mg by mouth daily.  Marland Kitchen lisinopril (PRINIVIL,ZESTRIL) 10 MG tablet Take one half (5mg ) daily  . metoprolol succinate (TOPROL-XL) 50 MG 24 hr tablet take 1 tablet by mouth once daily  . montelukast (SINGULAIR) 10 MG tablet Take 1 tablet (10 mg total) by mouth at bedtime.  . Multiple Vitamin (MULTIVITAMIN) tablet Take 1 tablet by mouth daily.    Marland Kitchen nystatin (MYCOSTATIN/NYSTOP) 100000 UNIT/GM POWD Apply to area three times daily as needed for rash  . oxybutynin (DITROPAN-XL) 10 MG 24 hr tablet Take 10 mg by mouth daily as needed.  Marland Kitchen oxyCODONE (ROXICODONE) 5 MG immediate release tablet Take 1 tablet (5 mg total) by mouth every 8 (eight) hours as needed.  . potassium chloride SA (K-DUR,KLOR-CON) 20 MEQ tablet Take 0.5 tablets (10 mEq total) by mouth 2 (two) times daily. May take extra tab when taking torsemide  . pregabalin (LYRICA) 75 MG capsule Take 75 mg by mouth 2 (two) times daily.  . rosuvastatin (CRESTOR) 10 MG tablet Take 1 tablet (10 mg total) by mouth daily.  Marland Kitchen Specialty Vitamins Products (MAGNESIUM, AMINO ACID CHELATE,) 133 MG tablet Take 1 tablet by mouth at bedtime.   Marland Kitchen terazosin (HYTRIN)  10 MG capsule Take 10 mg by mouth at bedtime.  . torsemide (DEMADEX) 20 MG tablet Take 20 mg by mouth daily as needed. Edema  . zolpidem (AMBIEN) 10 MG tablet take 1 tablet by mouth at bedtime  . [DISCONTINUED] lisinopril (PRINIVIL,ZESTRIL) 10 MG tablet take 1 tablet by mouth once daily     Review of Systems  See HPI   Physical Exam  BP 138/78  Pulse 75  Temp(Src) 97.6 F (36.4 C) (Oral)  Wt 329 lb (149.233 kg)  SpO2 96%  Constitutional: She is oriented to person, place, and time. She appears well-developed and  well-nourished.       obese  HENT:  Head: Normocephalic.  Nose: Nose normal.  Eyes: Conjunctivae are normal. Pupils are equal, round, and reactive to light, no nyastagmus.  Neck: Normal range of motion. Neck supple. No JVD present.  Cardiovascular: Normal rate, regular rhythm, S1 normal, S2 normal, normal heart sounds and intact distal pulses.  Exam reveals no gallop and no friction rub.   No murmur heard. Pulmonary/Chest: Effort normal and breath sounds normal. No respiratory distress. She has no wheezes. She has no rales. She exhibits no tenderness.  Abdominal: Soft. Bowel sounds are normal. She exhibits no distension. There is no tenderness.  Musculoskeletal: Normal range of motion. She exhibits no edema and no tenderness.  Lymphadenopathy:    She has no cervical adenopathy.  Neurological: She is alert and oriented to person, place, and time. Coordination normal.  Skin: Skin is warm and dry. No rash noted. No erythema.  Psychiatric: She has a normal mood and affect. Her behavior is normal. Judgment and thought content normal.         Assessment and Plan

## 2013-04-29 ENCOUNTER — Other Ambulatory Visit: Payer: Self-pay | Admitting: Family Medicine

## 2013-04-29 NOTE — Telephone Encounter (Signed)
Spoke to pt and informed her Rx has been called in to requested pharmacy 

## 2013-04-29 NOTE — Telephone Encounter (Signed)
Pt requesting medication refill. Last ov 02/2013 with no future appts scheduled. pls advise

## 2013-05-23 ENCOUNTER — Other Ambulatory Visit: Payer: Self-pay | Admitting: *Deleted

## 2013-05-23 ENCOUNTER — Other Ambulatory Visit: Payer: Self-pay

## 2013-05-23 MED ORDER — DABIGATRAN ETEXILATE MESYLATE 150 MG PO CAPS: 150.0000 mg | ORAL_CAPSULE | Freq: Two times a day (BID) | ORAL | Status: DC

## 2013-05-23 NOTE — Telephone Encounter (Signed)
Refill sent for Pradaxa 90 day supply to [facility] per the patient request.

## 2013-05-23 NOTE — Telephone Encounter (Signed)
90 day supply

## 2013-05-28 ENCOUNTER — Other Ambulatory Visit: Payer: Self-pay | Admitting: Family Medicine

## 2013-05-29 NOTE — Telephone Encounter (Signed)
Spoke to pt and informed her Rx has been called in to requested pharmacy 

## 2013-05-29 NOTE — Telephone Encounter (Signed)
Pt requesting medication refill. Last ov 03/2013 with no future appts scheduled. pls advise 

## 2013-06-19 ENCOUNTER — Encounter: Payer: Self-pay | Admitting: Internal Medicine

## 2013-06-19 ENCOUNTER — Ambulatory Visit (INDEPENDENT_AMBULATORY_CARE_PROVIDER_SITE_OTHER): Payer: Federal, State, Local not specified - PPO | Admitting: Internal Medicine

## 2013-06-19 VITALS — BP 138/78 | HR 68 | Temp 97.8°F | Wt 335.0 lb

## 2013-06-19 DIAGNOSIS — M549 Dorsalgia, unspecified: Secondary | ICD-10-CM

## 2013-06-19 DIAGNOSIS — M5380 Other specified dorsopathies, site unspecified: Secondary | ICD-10-CM

## 2013-06-19 DIAGNOSIS — M62830 Muscle spasm of back: Secondary | ICD-10-CM

## 2013-06-19 NOTE — Patient Instructions (Addendum)
Muscle Cramps and Spasms Muscle cramps and spasms occur when a muscle or muscles tighten and you have no control over this tightening (involuntary muscle contraction). They are a common problem and can develop in any muscle. The most common place is in the calf muscles of the leg. Both muscle cramps and muscle spasms are involuntary muscle contractions, but they also have differences:   Muscle cramps are sporadic and painful. They may last a few seconds to a quarter of an hour. Muscle cramps are often more forceful and last longer than muscle spasms.  Muscle spasms may or may not be painful. They may also last just a few seconds or much longer. CAUSES  It is uncommon for cramps or spasms to be due to a serious underlying problem. In many cases, the cause of cramps or spasms is unknown. Some common causes are:   Overexertion.   Overuse from repetitive motions (doing the same thing over and over).   Remaining in a certain position for a long period of time.   Improper preparation, form, or technique while performing a sport or activity.   Dehydration.   Injury.   Side effects of some medicines.   Abnormally low levels of the salts and ions in your blood (electrolytes), especially potassium and calcium. This could happen if you are taking water pills (diuretics) or you are pregnant.  Some underlying medical problems can make it more likely to develop cramps or spasms. These include, but are not limited to:   Diabetes.   Parkinson disease.   Hormone disorders, such as thyroid problems.   Alcohol abuse.   Diseases specific to muscles, joints, and bones.   Blood vessel disease where not enough blood is getting to the muscles.  HOME CARE INSTRUCTIONS   Stay well hydrated. Drink enough water and fluids to keep your urine clear or pale yellow.  It may be helpful to massage, stretch, and relax the affected muscle.  For tight or tense muscles, use a warm towel, heating  pad, or hot shower water directed to the affected area.  If you are sore or have pain after a cramp or spasm, applying ice to the affected area may relieve discomfort.  Put ice in a plastic bag.  Place a towel between your skin and the bag.  Leave the ice on for 15-20 minutes, 03-04 times a day.  Medicines used to treat a known cause of cramps or spasms may help reduce their frequency or severity. Only take over-the-counter or prescription medicines as directed by your caregiver. SEEK MEDICAL CARE IF:  Your cramps or spasms get more severe, more frequent, or do not improve over time.  MAKE SURE YOU:   Understand these instructions.  Will watch your condition.  Will get help right away if you are not doing well or get worse. Document Released: 08/05/2001 Document Revised: 06/10/2012 Document Reviewed: 01/31/2012 [facility] Patient Information 2014 [place], [place]. Back Exercises These exercises may help you when beginning to rehabilitate your injury. Your symptoms may resolve with or without further involvement from your physician, physical therapist or athletic trainer. While completing these exercises, remember:   Restoring tissue flexibility helps normal motion to return to the joints. This allows healthier, less painful movement and activity.  An effective stretch should be held for at least 30 seconds.  A stretch should never be painful. You should only feel a gentle lengthening or release in the stretched tissue. STRETCH  Extension, Prone on Elbows   Lie on your  stomach on the floor, a bed will be too soft. Place your palms about shoulder width apart and at the height of your head.  Place your elbows under your shoulders. If this is too painful, stack pillows under your chest.  Allow your body to relax so that your hips drop lower and make contact more completely with the floor.  Hold this position for __________ seconds.  Slowly return to lying flat on the floor. Repeat  __________ times. Complete this exercise __________ times per day.  RANGE OF MOTION  Extension, Prone Press Ups   Lie on your stomach on the floor, a bed will be too soft. Place your palms about shoulder width apart and at the height of your head.  Keeping your back as relaxed as possible, slowly straighten your elbows while keeping your hips on the floor. You may adjust the placement of your hands to maximize your comfort. As you gain motion, your hands will come more underneath your shoulders.  Hold this position __________ seconds.  Slowly return to lying flat on the floor. Repeat __________ times. Complete this exercise __________ times per day.  RANGE OF MOTION- Quadruped, Neutral Spine   Assume a hands and knees position on a firm surface. Keep your hands under your shoulders and your knees under your hips. You may place padding under your knees for comfort.  Drop your head and point your tail bone toward the ground below you. This will round out your low back like an angry cat. Hold this position for __________ seconds.  Slowly lift your head and release your tail bone so that your back sags into a large arch, like an old horse.  Hold this position for __________ seconds.  Repeat this until you feel limber in your low back.  Now, find your "sweet spot." This will be the most comfortable position somewhere between the two previous positions. This is your neutral spine. Once you have found this position, tense your stomach muscles to support your low back.  Hold this position for __________ seconds. Repeat __________ times. Complete this exercise __________ times per day.  STRETCH  Flexion, Single Knee to Chest   Lie on a firm bed or floor with both legs extended in front of you.  Keeping one leg in contact with the floor, bring your opposite knee to your chest. Hold your leg in place by either grabbing behind your thigh or at your knee.  Pull until you feel a gentle stretch in  your low back. Hold __________ seconds.  Slowly release your grasp and repeat the exercise with the opposite side. Repeat __________ times. Complete this exercise __________ times per day.  STRETCH - Hamstrings, Standing  Stand or sit and extend your right / left leg, placing your foot on a chair or foot stool  Keeping a slight arch in your low back and your hips straight forward.  Lead with your chest and lean forward at the waist until you feel a gentle stretch in the back of your right / left knee or thigh. (When done correctly, this exercise requires leaning only a small distance.)  Hold this position for __________ seconds. Repeat __________ times. Complete this stretch __________ times per day. STRENGTHENING  Deep Abdominals, Pelvic Tilt   Lie on a firm bed or floor. Keeping your legs in front of you, bend your knees so they are both pointed toward the ceiling and your feet are flat on the floor.  Tense your lower abdominal muscles to  press your low back into the floor. This motion will rotate your pelvis so that your tail bone is scooping upwards rather than pointing at your feet or into the floor.  With a gentle tension and even breathing, hold this position for __________ seconds. Repeat __________ times. Complete this exercise __________ times per day.  STRENGTHENING  Abdominals, Crunches   Lie on a firm bed or floor. Keeping your legs in front of you, bend your knees so they are both pointed toward the ceiling and your feet are flat on the floor. Cross your arms over your chest.  Slightly tip your chin down without bending your neck.  Tense your abdominals and slowly lift your trunk high enough to just clear your shoulder blades. Lifting higher can put excessive stress on the low back and does not further strengthen your abdominal muscles.  Control your return to the starting position. Repeat __________ times. Complete this exercise __________ times per day.  STRENGTHENING   Quadruped, Opposite UE/LE Lift   Assume a hands and knees position on a firm surface. Keep your hands under your shoulders and your knees under your hips. You may place padding under your knees for comfort.  Find your neutral spine and gently tense your abdominal muscles so that you can maintain this position. Your shoulders and hips should form a rectangle that is parallel with the floor and is not twisted.  Keeping your trunk steady, lift your right hand no higher than your shoulder and then your left leg no higher than your hip. Make sure you are not holding your breath. Hold this position __________ seconds.  Continuing to keep your abdominal muscles tense and your back steady, slowly return to your starting position. Repeat with the opposite arm and leg. Repeat __________ times. Complete this exercise __________ times per day. Document Released: 03/03/2005 Document Revised: 05/08/2011 Document Reviewed: 05/28/2008 [facility] Patient Information 2014 [place], [place].

## 2013-06-19 NOTE — Progress Notes (Signed)
Subjective:    Patient ID: Tina Pacheco, female    DOB: 06-15-1950, 62 y.o.   MRN: [id number]  HPI  Pt presents to the clinic today with c/o back pain and muscles spasms. She reports this started 2 days ago. The pain does radiate down her right leg. She does notice some tingling in her right leg.  She did try a heating pad which did help. She is walking with a cane. She is obese. 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 She already takes oxycodone and lyrica.  Review of Systems      Past Medical History  Diagnosis Date  . Hypertension   . Dyslipidemia   . Arrhythmia     paroxysmal atrial fibrillation  . CHF (congestive heart failure)     chronic diastolic  . Morbid obesity   . Hypokalemia   . Coronary artery disease     nonobstructive CAD  . Asthma   . Rheumatoid arthritis(714.0)   . Basal cell carcinoma   . Hyperplastic colonic polyp 2003  . History of gout   . COPD (chronic obstructive pulmonary disease)   . Atrial fibrillation     Current Outpatient Prescriptions  Medication Sig Dispense Refill  . albuterol (PROVENTIL HFA;VENTOLIN HFA) 108 (90 BASE) MCG/ACT inhaler Inhale 2 puffs into the lungs every 6 (six) hours as needed. For shortness of breath  3 Inhaler  1  . budesonide-formoterol (SYMBICORT) 160-4.5 MCG/ACT inhaler Inhale 2 puffs into the lungs 2 (two) times daily as needed. For shortness of breath  3 Inhaler  1  . chlorpheniramine-HYDROcodone (TUSSIONEX) 10-8 MG/5ML LQCR take 10 milliliters by mouth every 12 hours if needed  480 mL  0  . dabigatran (PRADAXA) 150 MG CAPS capsule Take 1 capsule (150 mg total) by mouth 2 (two) times daily.  180 capsule  3  . Febuxostat (ULORIC PO) Take one by mouth daily, pt unsure of strength      . fexofenadine (ALLEGRA) 180 MG tablet Take 180 mg by mouth daily.      . fluticasone (FLONASE) 50 MCG/ACT nasal spray Place 2 sprays into the nose daily as needed. For allergies       . hydroxychloroquine (PLAQUENIL) 200 MG tablet  Take 200 mg by mouth 2 (two) times daily.       Marland Kitchen KLOR-CON M20 20 MEQ tablet TAKE 1 TABLET TWICE A DAY, MAY TAKE EXTRA TABLET WHEN TAKING TORSEMIDE  180 tablet  3  . leflunomide (ARAVA) 20 MG tablet Take 20 mg by mouth daily.      Marland Kitchen lisinopril (PRINIVIL,ZESTRIL) 10 MG tablet Take one half (5mg ) daily      . metoprolol succinate (TOPROL-XL) 50 MG 24 hr tablet take 1 tablet by mouth once daily  30 tablet  6  . montelukast (SINGULAIR) 10 MG tablet Take 1 tablet (10 mg total) by mouth at bedtime.  90 tablet  1  . Multiple Vitamin (MULTIVITAMIN) tablet Take 1 tablet by mouth daily.        Marland Kitchen nystatin (MYCOSTATIN/NYSTOP) 100000 UNIT/GM POWD Apply to area three times daily as needed for rash  60 g  0  . oxybutynin (DITROPAN-XL) 10 MG 24 hr tablet Take 10 mg by mouth daily as needed.      Marland Kitchen oxyCODONE (ROXICODONE) 5 MG immediate release tablet Take 1 tablet (5 mg total) by mouth every 8 (eight) hours as needed for moderate pain.  270 tablet  0  . potassium chloride SA (  K-DUR,KLOR-CON) 20 MEQ tablet Take 0.5 tablets (10 mEq total) by mouth 2 (two) times daily. May take extra tab when taking torsemide  180 tablet  3  . pregabalin (LYRICA) 300 MG capsule Take 1 capsule (300 mg total) by mouth daily.      . rosuvastatin (CRESTOR) 10 MG tablet Take 1 tablet (10 mg total) by mouth daily.  90 tablet  3  . Specialty Vitamins Products (MAGNESIUM, AMINO ACID CHELATE,) 133 MG tablet Take 1 tablet by mouth at bedtime.       Marland Kitchen terazosin (HYTRIN) 10 MG capsule Take 10 mg by mouth at bedtime.      . torsemide (DEMADEX) 20 MG tablet Take 20 mg by mouth daily as needed. Edema      . zolpidem (AMBIEN) 10 MG tablet take 1 tablet by mouth at bedtime  30 tablet  0  . [DISCONTINUED] oxybutynin (DITROPAN-XL) 10 MG 24 hr tablet Take 10 mg by mouth daily.        No current facility-administered medications for this visit.    Allergies  Allergen Reactions  . Penicillins     Childhood allergies. But has tolerated amoxicillin  and other cillins since     Family History  Problem Relation Age of Onset  . Tuberculosis Mother   . Parkinsonism Mother   . Diabetes type II Sister   . Breast cancer Sister   . Breast cancer Maternal Aunt     History   Social History  . Marital Status: Married    Spouse Name: N/A    Number of Children: N/A  . Years of Education: N/A   Occupational History  . Not on file.   Social History Main Topics  . Smoking status: Never Smoker   . Smokeless tobacco: Never Used  . Alcohol Use: Yes     Comment: occasional-red wine  . Drug Use: No  . Sexual Activity: Not on file   Other Topics Concern  . Not on file   Social History Narrative  . No narrative on file     Constitutional: Denies fever, malaise, fatigue, headache or abrupt weight changes.  Musculoskeletal: Pt reports back pain with muscle spasms.  Skin: Denies redness, rashes, lesions or ulcercations.  Neurological: Denies dizziness, difficulty with memory, difficulty with speech or problems with balance and coordination.   No other specific complaints in a complete review of systems (except as listed in HPI above).  Objective:   Physical Exam   BP 138/78  Pulse 68  Temp(Src) 97.8 F (36.6 C) (Oral)  Wt 335 lb (151.955 kg)  SpO2 98% Wt Readings from Last 3 Encounters:  06/19/13 335 lb (151.955 kg)  04/16/13 329 lb (149.233 kg)  03/17/13 312 lb (141.522 kg)    General: Chronically ill appearing, obese in NAD. Musculoskeletal:  Pain with flexion and extension of the back. Pain with palpation of the right lower back. Gait normal with gait. Neurological: Positive straight leg raise.   BMET    Component Value Date/Time   NA 141 02/10/2013 1418   NA 136 06/24/2012 1142   K 4.3 02/10/2013 1418   CL 108 02/10/2013 1418   CO2 29 11/20/2012 1126   GLUCOSE 91 02/10/2013 1418   GLUCOSE 99 06/24/2012 1142   BUN 14 02/10/2013 1418   BUN 24 06/24/2012 1142   CREATININE 0.90 02/10/2013 1418   CREATININE 0.84  10/26/2010 1037   CALCIUM 10.0 11/20/2012 1126   GFRNONAA 53* 06/24/2012 1142   GFRAA 62 06/24/2012  1142    Lipid Panel     Component Value Date/Time   CHOL 198 11/20/2012 1126   TRIG 90.0 11/20/2012 1126   HDL 72.50 11/20/2012 1126   CHOLHDL 3 11/20/2012 1126   VLDL 18.0 11/20/2012 1126   LDLCALC 108* 11/20/2012 1126    CBC    Component Value Date/Time   WBC 4.6 08/19/2012 0807   WBC 4.4 07/05/2012 1120   RBC 3.90 08/19/2012 0807   RBC 3.58* 07/05/2012 1120   HGB 12.9 02/10/2013 1418   HCT 38.0 02/10/2013 1418   PLT 188.0 08/19/2012 0807   MCV 92.0 08/19/2012 0807   MCH 28.5 07/05/2012 1120   MCH 29.1 12/16/2011 0829   MCHC 32.7 08/19/2012 0807   MCHC 33.4 07/05/2012 1120   RDW 18.1* 08/19/2012 0807   RDW 16.9* 07/05/2012 1120   LYMPHSABS 2.3 08/19/2012 0807   LYMPHSABS 2.0 07/05/2012 1120   MONOABS 0.4 08/19/2012 0807   EOSABS 0.5 08/19/2012 0807   EOSABS 0.2 07/05/2012 1120   BASOSABS 0.1 08/19/2012 0807   BASOSABS 0.0 07/05/2012 1120    Hgb A1C Lab Results  Component Value Date   HGBA1C  Value: 6.3 (NOTE)                                                                       According to the ADA Clinical Practice Recommendations for 2011, when HbA1c is used as a screening test:   >=6.5%   Diagnostic of Diabetes Mellitus           (if abnormal result  is confirmed)  5.7-6.4%   Increased risk of developing Diabetes Mellitus  References:Diagnosis and Classification of Diabetes Mellitus,Diabetes YIRS,8546,27(OJJKK 1):S62-S69 and Standards of Medical Care in         Diabetes - 2011,Diabetes Care,2011,34  (Suppl 1):S11-S61.* 05/24/2010        Assessment & Plan:   Back pain with muscle spasms:  ? Sciatica Refuses xray and predtaper Will take ibuprofen OTC Stretching exercises given Encouraged her to lose weight as this would be beneficial to her back health  RTC as needed or if symptoms persist or worsen

## 2013-06-26 ENCOUNTER — Ambulatory Visit: Payer: Federal, State, Local not specified - PPO | Admitting: Family Medicine

## 2013-06-26 ENCOUNTER — Ambulatory Visit (INDEPENDENT_AMBULATORY_CARE_PROVIDER_SITE_OTHER): Payer: Federal, State, Local not specified - PPO | Admitting: Internal Medicine

## 2013-06-26 ENCOUNTER — Encounter: Payer: Self-pay | Admitting: Internal Medicine

## 2013-06-26 VITALS — BP 128/80 | HR 61 | Temp 97.9°F | Wt 339.0 lb

## 2013-06-26 DIAGNOSIS — M549 Dorsalgia, unspecified: Secondary | ICD-10-CM

## 2013-06-26 MED ORDER — CYCLOBENZAPRINE HCL 10 MG PO TABS: 10.0000 mg | ORAL_TABLET | Freq: Three times a day (TID) | ORAL | Status: DC | PRN

## 2013-06-26 NOTE — Patient Instructions (Addendum)
Back Pain, Adult Low back pain is very common. About 1 in 5 people have back pain.The cause of low back pain is rarely dangerous. The pain often gets better over time.About half of people with a sudden onset of back pain feel better in just 2 weeks. About 8 in 10 people feel better by 6 weeks.  CAUSES Some common causes of back pain include:  Strain of the muscles or ligaments supporting the spine.  Wear and tear (degeneration) of the spinal discs.  Arthritis.  Direct injury to the back. DIAGNOSIS Most of the time, the direct cause of low back pain is not known.However, back pain can be treated effectively even when the exact cause of the pain is unknown.Answering your caregiver's questions about your overall health and symptoms is one of the most accurate ways to make sure the cause of your pain is not dangerous. If your caregiver needs more information, he or she may order lab work or imaging tests (X-rays or MRIs).However, even if imaging tests show changes in your back, this usually does not require surgery. HOME CARE INSTRUCTIONS For many people, back pain returns.Since low back pain is rarely dangerous, it is often a condition that people can learn to manageon their own.   Remain active. It is stressful on the back to sit or stand in one place. Do not sit, drive, or stand in one place for more than 30 minutes at a time. Take short walks on level surfaces as soon as pain allows.Try to increase the length of time you walk each day.  Do not stay in bed.Resting more than 1 or 2 days can delay your recovery.  Do not avoid exercise or work.Your body is made to move.It is not dangerous to be active, even though your back may hurt.Your back will likely heal faster if you return to being active before your pain is gone.  Pay attention to your body when you bend and lift. Many people have less discomfortwhen lifting if they bend their knees, keep the load close to their bodies,and  avoid twisting. Often, the most comfortable positions are those that put less stress on your recovering back.  Find a comfortable position to sleep. Use a firm mattress and lie on your side with your knees slightly bent. If you lie on your back, put a pillow under your knees.  Only take over-the-counter or prescription medicines as directed by your caregiver. Over-the-counter medicines to reduce pain and inflammation are often the most helpful.Your caregiver may prescribe muscle relaxant drugs.These medicines help dull your pain so you can more quickly return to your normal activities and healthy exercise.  Put ice on the injured area.  Put ice in a plastic bag.  Place a towel between your skin and the bag.  Leave the ice on for 15-20 minutes, 03-04 times a day for the first 2 to 3 days. After that, ice and heat may be alternated to reduce pain and spasms.  Ask your caregiver about trying back exercises and gentle massage. This may be of some benefit.  Avoid feeling anxious or stressed.Stress increases muscle tension and can worsen back pain.It is important to recognize when you are anxious or stressed and learn ways to manage it.Exercise is a great option. SEEK MEDICAL CARE IF:  You have pain that is not relieved with rest or medicine.  You have pain that does not improve in 1 week.  You have new symptoms.  You are generally not feeling well. SEEK   IMMEDIATE MEDICAL CARE IF:   You have pain that radiates from your back into your legs.  You develop new bowel or bladder control problems.  You have unusual weakness or numbness in your arms or legs.  You develop nausea or vomiting.  You develop abdominal pain.  You feel faint. Document Released: 02/13/2005 Document Revised: 08/15/2011 Document Reviewed: 07/04/2010 Exit[employer] Patient Information 2014 [employer].  

## 2013-06-26 NOTE — Progress Notes (Signed)
Subjective:    Patient ID: Tina Pacheco, female    DOB: 06-06-1950, 63 y.o.   MRN: [id number]  HPI  Pt presents to the clinic today for followup of back pain. She was seen 1 week ago for back pain. It appeared to be sciatic pain. She had refused to try ibuprofen or prednisone. She used extra of her oxycodone and is now out of her medication 20 days early. She did noted improvement in her pain with the oxycodone but reports she went to a wedding over the weekend. She thinks riding in the car for a long period of time made the pain worse.  Review of Systems      Past Medical History  Diagnosis Date  . Hypertension   . Dyslipidemia   . Arrhythmia     paroxysmal atrial fibrillation  . CHF (congestive heart failure)     chronic diastolic  . Morbid obesity   . Hypokalemia   . Coronary artery disease     nonobstructive CAD  . Asthma   . Rheumatoid arthritis(714.0)   . Basal cell carcinoma   . Hyperplastic colonic polyp 2003  . History of gout   . COPD (chronic obstructive pulmonary disease)   . Atrial fibrillation     Current Outpatient Prescriptions  Medication Sig Dispense Refill  . albuterol (PROVENTIL HFA;VENTOLIN HFA) 108 (90 BASE) MCG/ACT inhaler Inhale 2 puffs into the lungs every 6 (six) hours as needed. For shortness of breath  3 Inhaler  1  . budesonide-formoterol (SYMBICORT) 160-4.5 MCG/ACT inhaler Inhale 2 puffs into the lungs 2 (two) times daily as needed. For shortness of breath  3 Inhaler  1  . chlorpheniramine-HYDROcodone (TUSSIONEX) 10-8 MG/5ML LQCR take 10 milliliters by mouth every 12 hours if needed  480 mL  0  . dabigatran (PRADAXA) 150 MG CAPS capsule Take 1 capsule (150 mg total) by mouth 2 (two) times daily.  180 capsule  3  . Febuxostat (ULORIC PO) Take one by mouth daily, pt unsure of strength      . fexofenadine (ALLEGRA) 180 MG tablet Take 180 mg by mouth daily.      . fluticasone (FLONASE) 50 MCG/ACT nasal spray Place 2 sprays into the nose daily as  needed. For allergies       . hydroxychloroquine (PLAQUENIL) 200 MG tablet Take 200 mg by mouth 2 (two) times daily.       Marland Kitchen KLOR-CON M20 20 MEQ tablet TAKE 1 TABLET TWICE A DAY, MAY TAKE EXTRA TABLET WHEN TAKING TORSEMIDE  180 tablet  3  . leflunomide (ARAVA) 20 MG tablet Take 20 mg by mouth daily.      Marland Kitchen lisinopril (PRINIVIL,ZESTRIL) 10 MG tablet Take one half (5mg ) daily      . metoprolol succinate (TOPROL-XL) 50 MG 24 hr tablet take 1 tablet by mouth once daily  30 tablet  6  . montelukast (SINGULAIR) 10 MG tablet Take 1 tablet (10 mg total) by mouth at bedtime.  90 tablet  1  . Multiple Vitamin (MULTIVITAMIN) tablet Take 1 tablet by mouth daily.        Marland Kitchen nystatin (MYCOSTATIN/NYSTOP) 100000 UNIT/GM POWD Apply to area three times daily as needed for rash  60 g  0  . oxybutynin (DITROPAN-XL) 10 MG 24 hr tablet Take 10 mg by mouth daily as needed.      Marland Kitchen oxyCODONE (ROXICODONE) 5 MG immediate release tablet Take 1 tablet (5 mg total) by mouth every 8 (eight) hours as  needed for moderate pain.  270 tablet  0  . potassium chloride SA (K-DUR,KLOR-CON) 20 MEQ tablet Take 0.5 tablets (10 mEq total) by mouth 2 (two) times daily. May take extra tab when taking torsemide  180 tablet  3  . pregabalin (LYRICA) 300 MG capsule Take 1 capsule (300 mg total) by mouth daily.      . rosuvastatin (CRESTOR) 10 MG tablet Take 1 tablet (10 mg total) by mouth daily.  90 tablet  3  . Specialty Vitamins Products (MAGNESIUM, AMINO ACID CHELATE,) 133 MG tablet Take 1 tablet by mouth at bedtime.       Marland Kitchen terazosin (HYTRIN) 10 MG capsule Take 10 mg by mouth at bedtime.      . torsemide (DEMADEX) 20 MG tablet Take 20 mg by mouth daily as needed. Edema      . zolpidem (AMBIEN) 10 MG tablet take 1 tablet by mouth at bedtime  30 tablet  0  . [DISCONTINUED] oxybutynin (DITROPAN-XL) 10 MG 24 hr tablet Take 10 mg by mouth daily.        No current facility-administered medications for this visit.    Allergies  Allergen  Reactions  . Penicillins     Childhood allergies. But has tolerated amoxicillin and other cillins since     Family History  Problem Relation Age of Onset  . Tuberculosis Mother   . Parkinsonism Mother   . Diabetes type II Sister   . Breast cancer Sister   . Breast cancer Maternal Aunt     History   Social History  . Marital Status: Married    Spouse Name: N/A    Number of Children: N/A  . Years of Education: N/A   Occupational History  . Not on file.   Social History Main Topics  . Smoking status: Never Smoker   . Smokeless tobacco: Never Used  . Alcohol Use: Yes     Comment: occasional-red wine  . Drug Use: No  . Sexual Activity: Not on file   Other Topics Concern  . Not on file   Social History Narrative  . No narrative on file     Constitutional: Denies fever, malaise, fatigue, headache or abrupt weight changes.  Musculoskeletal: Pt reports back pain. Denies difficulty with gait, or joint pain and swelling.    No other specific complaints in a complete review of systems (except as listed in HPI above).  Objective:   Physical Exam  BP 128/80  Pulse 61  Temp(Src) 97.9 F (36.6 C) (Oral)  Wt 339 lb (153.769 kg)  SpO2 97% Wt Readings from Last 3 Encounters:  06/26/13 339 lb (153.769 kg)  06/19/13 335 lb (151.955 kg)  04/16/13 329 lb (149.233 kg)    General: Appears her stated age, obese but well developed, well nourished in NAD.  Cardiovascular: Normal rate and rhythm. S1,S2 noted.  No murmur, rubs or gallops noted. No JVD or BLE edema. No carotid bruits noted. Pulmonary/Chest: Normal effort and positive vesicular breath sounds. No respiratory distress. No wheezes, rales or ronchi noted.  Musculoskeletal: she struggles with flexion, extension and rotation of her back due to her size. No pain with palpation along the spine. Pain with palpation along the right lower back.   BMET    Component Value Date/Time   NA 141 02/10/2013 1418   NA 136  06/24/2012 1142   K 4.3 02/10/2013 1418   CL 108 02/10/2013 1418   CO2 29 11/20/2012 1126   GLUCOSE 91 02/10/2013 1418  GLUCOSE 99 06/24/2012 1142   BUN 14 02/10/2013 1418   BUN 24 06/24/2012 1142   CREATININE 0.90 02/10/2013 1418   CREATININE 0.84 10/26/2010 1037   CALCIUM 10.0 11/20/2012 1126   GFRNONAA 53* 06/24/2012 1142   GFRAA 62 06/24/2012 1142    Lipid Panel     Component Value Date/Time   CHOL 198 11/20/2012 1126   TRIG 90.0 11/20/2012 1126   HDL 72.50 11/20/2012 1126   CHOLHDL 3 11/20/2012 1126   VLDL 18.0 11/20/2012 1126   LDLCALC 108* 11/20/2012 1126    CBC    Component Value Date/Time   WBC 4.6 08/19/2012 0807   WBC 4.4 07/05/2012 1120   RBC 3.90 08/19/2012 0807   RBC 3.58* 07/05/2012 1120   HGB 12.9 02/10/2013 1418   HCT 38.0 02/10/2013 1418   PLT 188.0 08/19/2012 0807   MCV 92.0 08/19/2012 0807   MCH 28.5 07/05/2012 1120   MCH 29.1 12/16/2011 0829   MCHC 32.7 08/19/2012 0807   MCHC 33.4 07/05/2012 1120   RDW 18.1* 08/19/2012 0807   RDW 16.9* 07/05/2012 1120   LYMPHSABS 2.3 08/19/2012 0807   LYMPHSABS 2.0 07/05/2012 1120   MONOABS 0.4 08/19/2012 0807   EOSABS 0.5 08/19/2012 0807   EOSABS 0.2 07/05/2012 1120   BASOSABS 0.1 08/19/2012 0807   BASOSABS 0.0 07/05/2012 1120    Hgb A1C Lab Results  Component Value Date   HGBA1C  Value: 6.3 (NOTE)                                                                       According to the ADA Clinical Practice Recommendations for 2011, when HbA1c is used as a screening test:   >=6.5%   Diagnostic of Diabetes Mellitus           (if abnormal result  is confirmed)  5.7-6.4%   Increased risk of developing Diabetes Mellitus  References:Diagnosis and Classification of Diabetes Mellitus,Diabetes RWER,1540,08(QPYPP 1):S62-S69 and Standards of Medical Care in         Diabetes - 2011,Diabetes Care,2011,34  (Suppl 1):S11-S61.* 05/24/2010         Assessment & Plan:   Right sciatica:  I advised her that I could not refill her pain medication  today Encouraged her to try ibuprofen-she declines this eRx for flexeril to see if this will help  If pain persist or worsens, follow up with PCP

## 2013-06-29 ENCOUNTER — Other Ambulatory Visit: Payer: Self-pay | Admitting: Family Medicine

## 2013-06-30 NOTE — Telephone Encounter (Signed)
Prescription faxed to [facility] [address].

## 2013-06-30 NOTE — Telephone Encounter (Signed)
Last office visit 06/26/2013 with Webb Silversmith.  Last refilled 04/02/215.  Ok to refill?

## 2013-07-01 ENCOUNTER — Telehealth: Payer: Self-pay

## 2013-07-01 NOTE — Telephone Encounter (Signed)
Prior auth has been submitted through cover my meds.

## 2013-07-02 NOTE — Telephone Encounter (Addendum)
Pt left v/m requesting status of zolpidem refill;spoke with Judson Roch at [facility] and zolpidem requires PA; see 07/01/13 phone note; PA requested. Pt voiced understanding.

## 2013-07-04 ENCOUNTER — Telehealth: Payer: Self-pay | Admitting: *Deleted

## 2013-07-04 NOTE — Telephone Encounter (Signed)
Fax received indicating PA not required for ambien. Copy faxed to pharmacy and sent to be scanned

## 2013-07-15 ENCOUNTER — Encounter: Payer: Self-pay | Admitting: Family Medicine

## 2013-07-15 DIAGNOSIS — G4700 Insomnia, unspecified: Secondary | ICD-10-CM | POA: Insufficient documentation

## 2013-07-22 ENCOUNTER — Encounter: Payer: Self-pay | Admitting: Family Medicine

## 2013-07-22 ENCOUNTER — Ambulatory Visit (INDEPENDENT_AMBULATORY_CARE_PROVIDER_SITE_OTHER): Payer: Federal, State, Local not specified - PPO | Admitting: Family Medicine

## 2013-07-22 VITALS — BP 126/76 | HR 76 | Temp 97.7°F | Ht 64.0 in | Wt 322.0 lb

## 2013-07-22 DIAGNOSIS — R053 Chronic cough: Secondary | ICD-10-CM

## 2013-07-22 DIAGNOSIS — M549 Dorsalgia, unspecified: Secondary | ICD-10-CM

## 2013-07-22 DIAGNOSIS — E785 Hyperlipidemia, unspecified: Secondary | ICD-10-CM

## 2013-07-22 DIAGNOSIS — R059 Cough, unspecified: Secondary | ICD-10-CM

## 2013-07-22 DIAGNOSIS — M7741 Metatarsalgia, right foot: Secondary | ICD-10-CM | POA: Insufficient documentation

## 2013-07-22 DIAGNOSIS — R05 Cough: Secondary | ICD-10-CM

## 2013-07-22 DIAGNOSIS — M7750 Other enthesopathy of unspecified foot: Secondary | ICD-10-CM

## 2013-07-22 MED ORDER — ATORVASTATIN CALCIUM 20 MG PO TABS: 20.0000 mg | ORAL_TABLET | Freq: Every day | ORAL | Status: DC

## 2013-07-22 MED ORDER — OXYCODONE HCL 5 MG PO TABS: ORAL_TABLET | ORAL | Status: DC

## 2013-07-22 MED ORDER — TERAZOSIN HCL 10 MG PO CAPS: 10.0000 mg | ORAL_CAPSULE | Freq: Every day | ORAL | Status: DC

## 2013-07-22 MED ORDER — HYDROCOD POLST-CHLORPHEN POLST 10-8 MG/5ML PO LQCR: ORAL | Status: DC

## 2013-07-22 NOTE — Assessment & Plan Note (Signed)
She will schedule follow up with podiatry.

## 2013-07-22 NOTE — Progress Notes (Signed)
Patient ID: Tina Pacheco, female    DOB: 10/04/1950, 63 y.o.   MRN: [id number]  HPI Comments: Tina Pacheco is a 63 year old with complicated medical history, including  paroxysmal atrial fibrillation here for chronic cough deterioration.  Chronic cough- has episodes of tickle in her throat that leads to terrible cough, often has post tussive emesis.  Restarted again 2 weeks ago.    Has not needed Tussionex for past two month.   H/o allergic rhinitis/asthma- currently taking flonase, allegra and symbicort.   Had run out of singulair months ago and has not noticed any difference.  CXRs have been neg.  Tussionex has really helped. Would like refill today.  No CP.   No fevers or chills.    No difficulty swallowing.  Also saw Tina Silversmith, NP, twice last month for right back pain with radiculopathy.  Started on 4/21. She sent in for for flexeril.  Pt refuses NSAID, predtaper and xray.  Did not like flexeril.  Taking her Lyrica.  Feeling a little better.  Also given stretching exercises but doesn't like to stick with them.  Just saw her RA doctor.  Has lost weight. Has been working on diet and exercise.  Also has less fluid in legs. Wt Readings from Last 3 Encounters:  07/22/13 322 lb (146.058 kg)  06/26/13 339 lb (153.769 kg)  06/19/13 335 lb (151.955 kg)    HLD-  On crestor 10 mg daily.  Now a tier 3.  Would like generic rx. Lab Results  Component Value Date   CHOL 198 11/20/2012   HDL 72.50 11/20/2012   LDLCALC 108* 11/20/2012   TRIG 90.0 11/20/2012   CHOLHDL 3 11/20/2012    Outpatient Encounter Prescriptions as of 07/22/2013  Medication Sig  . albuterol (PROVENTIL HFA;VENTOLIN HFA) 108 (90 BASE) MCG/ACT inhaler Inhale 2 puffs into the lungs every 6 (six) hours as needed. For shortness of breath  . budesonide-formoterol (SYMBICORT) 160-4.5 MCG/ACT inhaler Inhale 2 puffs into the lungs 2 (two) times daily as needed. For shortness of breath  . chlorpheniramine-HYDROcodone  (TUSSIONEX) 10-8 MG/5ML LQCR take 10 milliliters by mouth every 12 hours if needed  . dabigatran (PRADAXA) 150 MG CAPS capsule Take 1 capsule (150 mg total) by mouth 2 (two) times daily.  . Febuxostat (ULORIC PO) Take one by mouth daily, pt unsure of strength  . fexofenadine (ALLEGRA) 180 MG tablet Take 180 mg by mouth daily.  . fluticasone (FLONASE) 50 MCG/ACT nasal spray Place 2 sprays into the nose daily as needed. For allergies   . KLOR-CON M20 20 MEQ tablet TAKE 1 TABLET TWICE A DAY, MAY TAKE EXTRA TABLET WHEN TAKING TORSEMIDE  . leflunomide (ARAVA) 20 MG tablet Take 20 mg by mouth daily.  Marland Kitchen lisinopril (PRINIVIL,ZESTRIL) 10 MG tablet Take one half (5mg ) daily  . metoprolol succinate (TOPROL-XL) 50 MG 24 hr tablet take 1 tablet by mouth once daily  . montelukast (SINGULAIR) 10 MG tablet Take 1 tablet (10 mg total) by mouth at bedtime.  . Multiple Vitamin (MULTIVITAMIN) tablet Take 1 tablet by mouth daily.    Marland Kitchen nystatin (MYCOSTATIN/NYSTOP) 100000 UNIT/GM POWD Apply to area three times daily as needed for rash  . oxybutynin (DITROPAN-XL) 10 MG 24 hr tablet Take 10 mg by mouth daily as needed.  Marland Kitchen oxyCODONE (ROXICODONE) 5 MG immediate release tablet Take 1 tablet (5 mg total) by mouth every 8 (eight) hours as needed for moderate pain.  . potassium chloride SA (K-DUR,KLOR-CON) 20 MEQ tablet Take 0.5  tablets (10 mEq total) by mouth 2 (two) times daily. May take extra tab when taking torsemide  . pregabalin (LYRICA) 300 MG capsule Take 1 capsule (300 mg total) by mouth daily.  . rosuvastatin (CRESTOR) 10 MG tablet Take 1 tablet (10 mg total) by mouth daily.  Marland Kitchen Specialty Vitamins Products (MAGNESIUM, AMINO ACID CHELATE,) 133 MG tablet Take 1 tablet by mouth at bedtime.   Marland Kitchen terazosin (HYTRIN) 10 MG capsule Take 10 mg by mouth at bedtime.  . torsemide (DEMADEX) 20 MG tablet Take 20 mg by mouth daily as needed. Edema  . zolpidem (AMBIEN) 10 MG tablet take 1 tablet by mouth at bedtime  . [DISCONTINUED]  cyclobenzaprine (FLEXERIL) 10 MG tablet Take 1 tablet (10 mg total) by mouth 3 (three) times daily as needed for muscle spasms.  . [DISCONTINUED] hydroxychloroquine (PLAQUENIL) 200 MG tablet Take 200 mg by mouth 2 (two) times daily.      Review of Systems  See HPI +right leg pain worse with walking No hematuria   Physical Exam  BP 126/76  Pulse 76  Temp(Src) 97.7 F (36.5 C) (Oral)  Ht 5\' 4"  (1.626 m)  Wt 322 lb (146.058 kg)  BMI 55.24 kg/m2  SpO2 95%  Constitutional: She is oriented to person, place, and time. She appears well-developed and well-nourished.       obese  HENT:  Head: Normocephalic.  Nose: Nose normal.  Eyes: Conjunctivae are normal. Pupils are equal, round, and reactive to light, no nyastagmus.  Neck: Normal range of motion. Neck supple. No JVD present.  Cardiovascular: Normal rate, regular rhythm, S1 normal, S2 normal, normal heart sounds and intact distal pulses.  Exam reveals no gallop and no friction rub.   No murmur heard. Pulmonary/Chest: Effort normal and breath sounds normal. No respiratory distress. She has no wheezes. She has no rales. She exhibits no tenderness.  Abdominal: Soft. Bowel sounds are normal. She exhibits no distension. There is no tenderness.  Musculoskeletal: Normal range of motion. She exhibits no edema and no tenderness.  Lymphadenopathy:    She has no cervical adenopathy.  Neurological: She is alert and oriented to person, place, and time. Coordination normal.  Skin: Skin is warm and dry. No rash noted. No erythema.  Psychiatric: She has a normal mood and affect. Her behavior is normal. Judgment and thought content normal.         Assessment and Plan

## 2013-07-22 NOTE — Patient Instructions (Addendum)
Great to see you. Let me know if you want physical therapy or further evaluation like we discussed today  Please make an appointment with your podiatrist.  Please come to see me in 8 weeks to recheck your cholesterol and liver function.

## 2013-07-22 NOTE — Assessment & Plan Note (Signed)
D/c crestor. Start lipitor 20 mg daily. Follow up labs in 8 weeks.

## 2013-07-22 NOTE — Progress Notes (Signed)
Pre visit review using our clinic review tool, if applicable. No additional management support is needed unless otherwise documented below in the visit note. 

## 2013-07-22 NOTE — Assessment & Plan Note (Signed)
Tussionex refilled 

## 2013-07-23 ENCOUNTER — Telehealth: Payer: Self-pay | Admitting: *Deleted

## 2013-07-23 NOTE — Telephone Encounter (Signed)
Fax received indicating a PA is required for pt to receive ambien. Information submitted to covermymeds for approval/denial

## 2013-08-11 NOTE — Telephone Encounter (Signed)
PA has been received and has been denied. Pharmacy contacted and advised of denial

## 2013-08-20 ENCOUNTER — Telehealth: Payer: Self-pay | Admitting: *Deleted

## 2013-08-20 NOTE — Telephone Encounter (Signed)
Spoke w/ pt.  She reports that she has had chronic nosebleeds for some time now. H/o of cauterizations.  Dr. Loletta Specter would like to perform again.  Saw Dr. Tami Ribas today, who states that pt is not in afib and they cannot proceed until pt is off of her Pradaxa.  Pt reports that her nose bleeds are getting so bad that she cannot stand at the sink to brush her teeth w/o blood dripping down her arm.  Please advise.  Thank you.

## 2013-08-20 NOTE — Telephone Encounter (Signed)
Patient called and wants to know if she needs to stay on Pradaxa or any blood thinner. Patient is having chronic nose bleeds. Please call

## 2013-08-21 NOTE — Telephone Encounter (Signed)
Spoke w/ pt.  Advised her of Dr. Donivan Scull recommendation. She is agreeable and will contact us with further questions or concerns.

## 2013-08-21 NOTE — Telephone Encounter (Signed)
Yes would hold anticoagulation at least 2 days before the procedure, possibly even 3 days Would wait a day or 2 before restarting anticoagulation after procedure

## 2013-09-16 ENCOUNTER — Ambulatory Visit (INDEPENDENT_AMBULATORY_CARE_PROVIDER_SITE_OTHER): Payer: Federal, State, Local not specified - PPO | Admitting: Family Medicine

## 2013-09-16 ENCOUNTER — Encounter: Payer: Self-pay | Admitting: Family Medicine

## 2013-09-16 ENCOUNTER — Encounter: Payer: Self-pay | Admitting: *Deleted

## 2013-09-16 VITALS — BP 120/72 | HR 68 | Temp 97.9°F | Wt 331.5 lb

## 2013-09-16 DIAGNOSIS — R05 Cough: Secondary | ICD-10-CM

## 2013-09-16 DIAGNOSIS — R053 Chronic cough: Secondary | ICD-10-CM

## 2013-09-16 DIAGNOSIS — R059 Cough, unspecified: Secondary | ICD-10-CM

## 2013-09-16 DIAGNOSIS — H1013 Acute atopic conjunctivitis, bilateral: Secondary | ICD-10-CM

## 2013-09-16 DIAGNOSIS — E669 Obesity, unspecified: Secondary | ICD-10-CM

## 2013-09-16 DIAGNOSIS — H1010 Acute atopic conjunctivitis, unspecified eye: Secondary | ICD-10-CM | POA: Insufficient documentation

## 2013-09-16 DIAGNOSIS — J309 Allergic rhinitis, unspecified: Secondary | ICD-10-CM

## 2013-09-16 DIAGNOSIS — H1045 Other chronic allergic conjunctivitis: Secondary | ICD-10-CM

## 2013-09-16 DIAGNOSIS — E785 Hyperlipidemia, unspecified: Secondary | ICD-10-CM

## 2013-09-16 DIAGNOSIS — I10 Essential (primary) hypertension: Secondary | ICD-10-CM

## 2013-09-16 LAB — COMPREHENSIVE METABOLIC PANEL
ALT: 16 U/L (ref 0–35)
AST: 20 U/L (ref 0–37)
Albumin: 3.6 g/dL (ref 3.5–5.2)
Alkaline Phosphatase: 106 U/L (ref 39–117)
BUN: 15 mg/dL (ref 6–23)
CALCIUM: 9.9 mg/dL (ref 8.4–10.5)
CHLORIDE: 108 meq/L (ref 96–112)
CO2: 27 meq/L (ref 19–32)
Creatinine, Ser: 0.9 mg/dL (ref 0.4–1.2)
GFR: 71.65 mL/min (ref >60.00)
Glucose, Bld: 89 mg/dL (ref 70–99)
POTASSIUM: 4.3 meq/L (ref 3.5–5.1)
SODIUM: 140 meq/L (ref 135–145)
Total Bilirubin: 0.9 mg/dL (ref 0.2–1.2)
Total Protein: 6.8 g/dL (ref 6.0–8.3)

## 2013-09-16 LAB — LIPID PANEL
Cholesterol: 114 mg/dL (ref 0–200)
HDL: 43.5 mg/dL (ref >39.00)
LDL Cholesterol: 55 mg/dL (ref 0–99)
NonHDL: 70.5
Total CHOL/HDL Ratio: 3
Triglycerides: 78 mg/dL (ref 0.0–149.0)
VLDL: 15.6 mg/dL (ref 0.0–40.0)

## 2013-09-16 MED ORDER — BEPOTASTINE BESILATE 1.5 % OP SOLN: OPHTHALMIC | Status: DC

## 2013-09-16 NOTE — Progress Notes (Signed)
Patient ID: Tina Pacheco, female    DOB: 10/09/1950, 63 y.o.   MRN: [id number]  HPI Comments: Tina Pacheco is a 63 year old with complicated medical history, including  paroxysmal atrial fibrillation here for follow up.  H/o allergic rhinitis/asthma- currently taking allegra and symbicort.   Had run out of singulair months ago and did not restart it because she thought it wasn't doing much.  Stopped flonase because she was having recurrent nose bleeds requiring cautery.  Has been having very irritated watery eyes for past several week.  HLD-  Was taking crestor 10 mg daily.  Now a tier 3.  Wanted a generic rx so we d/c'd crestor and started lipitor 20 mg daily two months ago.  Denies myalgias or other noticeable side effects. Lab Results  Component Value Date   CHOL 198 11/20/2012   HDL 72.50 11/20/2012   LDLCALC 108* 11/20/2012   TRIG 90.0 11/20/2012   CHOLHDL 3 11/20/2012    Outpatient Encounter Prescriptions as of 09/16/2013  Medication Sig  . albuterol (PROVENTIL HFA;VENTOLIN HFA) 108 (90 BASE) MCG/ACT inhaler Inhale 2 puffs into the lungs every 6 (six) hours as needed. For shortness of breath  . atorvastatin (LIPITOR) 20 MG tablet Take 1 tablet (20 mg total) by mouth daily.  . budesonide-formoterol (SYMBICORT) 160-4.5 MCG/ACT inhaler Inhale 2 puffs into the lungs 2 (two) times daily as needed. For shortness of breath  . chlorpheniramine-HYDROcodone (TUSSIONEX) 10-8 MG/5ML LQCR take 10 milliliters by mouth every 12 hours if needed  . dabigatran (PRADAXA) 150 MG CAPS capsule Take 1 capsule (150 mg total) by mouth 2 (two) times daily.  . Febuxostat (ULORIC PO) Take one by mouth daily, pt unsure of strength  . fexofenadine (ALLEGRA) 180 MG tablet Take 180 mg by mouth daily.  . fluticasone (FLONASE) 50 MCG/ACT nasal spray Place 2 sprays into the nose daily as needed. For allergies   . ketotifen (ZADITOR) 0.025 % ophthalmic solution 1 drop 2 (two) times daily.  Marland Kitchen leflunomide (ARAVA) 20 MG  tablet Take 20 mg by mouth daily.  Marland Kitchen lisinopril (PRINIVIL,ZESTRIL) 10 MG tablet Take one half (5mg ) daily  . metoprolol succinate (TOPROL-XL) 50 MG 24 hr tablet take 1 tablet by mouth once daily  . Multiple Vitamin (MULTIVITAMIN) tablet Take 1 tablet by mouth daily.    Marland Kitchen nystatin (MYCOSTATIN/NYSTOP) 100000 UNIT/GM POWD Apply to area three times daily as needed for rash  . oxybutynin (DITROPAN-XL) 10 MG 24 hr tablet Take 10 mg by mouth daily as needed.  Marland Kitchen oxyCODONE (ROXICODONE) 5 MG immediate release tablet 2 tablets every 12 hours  . potassium chloride SA (K-DUR,KLOR-CON) 20 MEQ tablet Take 0.5 tablets (10 mEq total) by mouth 2 (two) times daily. May take extra tab when taking torsemide  . pregabalin (LYRICA) 300 MG capsule Take 1 capsule (300 mg total) by mouth daily.  Marland Kitchen Specialty Vitamins Products (MAGNESIUM, AMINO ACID CHELATE,) 133 MG tablet Take 1 tablet by mouth at bedtime.   Marland Kitchen terazosin (HYTRIN) 10 MG capsule Take 1 capsule (10 mg total) by mouth at bedtime.  . torsemide (DEMADEX) 20 MG tablet Take 20 mg by mouth daily as needed. Edema  . zolpidem (AMBIEN) 10 MG tablet take 1 tablet by mouth at bedtime     Review of Systems  See HPI  No blurred vision No eye pain No photophobia  Physical Exam  BP 120/72  Pulse 68  Temp(Src) 97.9 F (36.6 C) (Oral)  Wt 331 lb 8 oz (150.367 kg)  SpO2 94%  Constitutional: She is oriented to person, place, and time. She appears well-developed and well-nourished.       obese  HENT:  Head: Normocephalic.  Nose: Nose normal.  Eyes: Pupils are equal, round, and reactive to light, no nyastagmus.  + bilateral conjunctival injection, tearing Neurological: She is alert and oriented to person, place, and time. Coordination normal.  Skin: Skin is warm and dry. No rash noted. No erythema.  Psychiatric: She has a normal mood and affect. Her behavior is normal. Judgment and thought content normal.

## 2013-09-16 NOTE — Assessment & Plan Note (Signed)
Check labs today. Continue current rx. Orders Placed This Encounter  Procedures  . Comprehensive metabolic panel  . Lipid panel

## 2013-09-16 NOTE — Assessment & Plan Note (Signed)
New-  eRx sent for Bepreve. No red flag symptoms. Call or return to clinic prn if these symptoms worsen or fail to improve as anticipated. The patient indicates understanding of these issues and agrees with the plan.

## 2013-09-16 NOTE — Patient Instructions (Signed)
Great to see you. Let's try the Bepreve drops- 1 drop twice daily. Let me know me know this week if no improvement.

## 2013-09-16 NOTE — Progress Notes (Signed)
Pre visit review using our clinic review tool, if applicable. No additional management support is needed unless otherwise documented below in the visit note. 

## 2013-09-17 ENCOUNTER — Telehealth: Payer: Self-pay | Admitting: Family Medicine

## 2013-09-17 NOTE — Telephone Encounter (Signed)
Relevant patient education mailed to patient.  

## 2013-09-19 ENCOUNTER — Other Ambulatory Visit: Payer: Self-pay | Admitting: Family Medicine

## 2013-09-24 ENCOUNTER — Other Ambulatory Visit: Payer: Self-pay | Admitting: Family Medicine

## 2013-09-24 ENCOUNTER — Encounter: Payer: Self-pay | Admitting: Family Medicine

## 2013-09-24 MED ORDER — HYDROCOD POLST-CHLORPHEN POLST 10-8 MG/5ML PO LQCR: ORAL | Status: DC

## 2013-09-24 NOTE — Telephone Encounter (Signed)
Spoke to pt and advised per Dr Deborra Medina. Pt informed Rx is available for pickup from the front desk

## 2013-09-24 NOTE — Telephone Encounter (Signed)
Ok to refill Tussionex. Yes, I would try to change her antihistamine to claritin and continue current dose of eye drops for now.  Keep [place] updated.

## 2013-09-24 NOTE — Telephone Encounter (Signed)
Pt seen on 09/16/13 for allergic conjunctivitis.  She has been using Bepotastine Besilate 1.5 % SOLN 1 gtt BID.  She continues to have itchy, watery eyes and wants to know if she can increase the amount of gtt she is using per day or if she should change her main allergy medication to Claritin to see if that will help.  Also requesting refill on Tussionex.  Last filled 07/22/13.

## 2013-10-15 ENCOUNTER — Encounter: Payer: Self-pay | Admitting: Family Medicine

## 2013-10-23 ENCOUNTER — Other Ambulatory Visit: Payer: Self-pay

## 2013-10-23 MED ORDER — OXYCODONE HCL 5 MG PO TABS: ORAL_TABLET | ORAL | Status: DC

## 2013-10-23 NOTE — Telephone Encounter (Signed)
LM on pts vm informing her Rx is available for pickup at the front desk

## 2013-10-23 NOTE — Telephone Encounter (Signed)
Pt left v/m requesting rx oxycodone. Call when ready for pick up. 

## 2013-12-08 ENCOUNTER — Other Ambulatory Visit: Payer: Self-pay | Admitting: Cardiovascular Disease

## 2013-12-24 ENCOUNTER — Telehealth: Payer: Self-pay

## 2013-12-24 NOTE — Telephone Encounter (Signed)
Pt called and wants to know how often pt will have to have toxicology testing done;pt request cb; lab sheet list low risk.Please advise.

## 2013-12-24 NOTE — Telephone Encounter (Signed)
Spoke to pt and informed her that unfortunately we are unable to disclose when she will have to take a UDS. We do not notify them, so it can be somewhat random and we can verify that they are taking meds as directed.

## 2013-12-29 ENCOUNTER — Encounter: Payer: Self-pay | Admitting: Cardiovascular Disease

## 2013-12-29 ENCOUNTER — Ambulatory Visit (INDEPENDENT_AMBULATORY_CARE_PROVIDER_SITE_OTHER): Payer: Federal, State, Local not specified - PPO | Admitting: Cardiovascular Disease

## 2013-12-29 VITALS — BP 140/72 | HR 95 | Ht 64.0 in | Wt 321.0 lb

## 2013-12-29 DIAGNOSIS — E669 Obesity, unspecified: Secondary | ICD-10-CM

## 2013-12-29 DIAGNOSIS — I480 Paroxysmal atrial fibrillation: Secondary | ICD-10-CM

## 2013-12-29 DIAGNOSIS — I5189 Other ill-defined heart diseases: Secondary | ICD-10-CM

## 2013-12-29 DIAGNOSIS — M1990 Unspecified osteoarthritis, unspecified site: Secondary | ICD-10-CM

## 2013-12-29 DIAGNOSIS — E785 Hyperlipidemia, unspecified: Secondary | ICD-10-CM

## 2013-12-29 DIAGNOSIS — I519 Heart disease, unspecified: Secondary | ICD-10-CM

## 2013-12-29 DIAGNOSIS — I25119 Atherosclerotic heart disease of native coronary artery with unspecified angina pectoris: Secondary | ICD-10-CM

## 2013-12-29 DIAGNOSIS — I10 Essential (primary) hypertension: Secondary | ICD-10-CM

## 2013-12-29 NOTE — Assessment & Plan Note (Signed)
Currently with no symptoms of angina. No further workup at this time. Continue current medication regimen. 

## 2013-12-29 NOTE — Assessment & Plan Note (Signed)
Cholesterol is at goal on the current lipid regimen. No changes to the medications were made.  

## 2013-12-29 NOTE — Assessment & Plan Note (Signed)
She does have shortness of breath, weight gain. No dramatic pitting edema. Recommended she take her torsemide for shortness of breath. She is high risk of diastolic CHF

## 2013-12-29 NOTE — Assessment & Plan Note (Signed)
Blood pressure is well controlled on today's visit. No changes made to the medications. 

## 2013-12-29 NOTE — Patient Instructions (Signed)
You are doing well. No medication changes were made.  Ask Dr. Deborra Medina about how to sleep better, trazadone Sleep study?  Don't forget torsemide for shortness of breath, weight gain, leg swelling   Please call us if you have new issues that need to be addressed before your next appt.  Your physician wants you to follow-up in: 6 months.  You will receive a reminder letter in the mail two months in advance. If you don't receive a letter, please call our office to schedule the follow-up appointment.

## 2013-12-29 NOTE — Assessment & Plan Note (Signed)
Tolerating pradaxa. No recent falls,

## 2013-12-29 NOTE — Assessment & Plan Note (Signed)
Maintaining normal sinus rhythm. No changes to her medications 

## 2013-12-29 NOTE — Assessment & Plan Note (Signed)
We have encouraged continued exercise, careful diet management in an effort to lose weight. 

## 2013-12-29 NOTE — Assessment & Plan Note (Signed)
She continues to have chronic, hip pain. Likely exacerbated by obesity

## 2013-12-29 NOTE — Progress Notes (Signed)
Patient ID: Tina Pacheco, female    DOB: [birth year], 63 y.o.   MRN: [id number]  HPI Comments: Tina Pacheco is a 63 year old woman with morbid obesity, who was admitted to [facility] on May 23 2010 for paroxysmal atrial fibrillation, echocardiogram showing normal LV function with diastolic dysfunction, also with hyperlipidemia, hypertension, cardiac catheterization May 24 2010 showing mild nonobstructive coronary artery disease, history of asthma/COPD, suspected sleep apnea, rheumatoid arthritis who presents for routine followup. episodes of atrial fibrillation were March and July 2012, October 2013.  episode of atrial fibrillation 12/16/2011. She was started on diltiazem infusion and converted to normal sinus rhythm several hours later.   She denies any recent episodes. Continues to have chronic shortness of breath. She reports that she has significant fatigue, is not sleeping well, only several hours per night. Weight is up from 315 pounds now 321 pounds. Not doing any exercise, very deconditioned. Reports that her fibromyalgia and rheumatoid arthritis are slowing her down. Continued significant arthritis in her knees, legs.  Family previously indicated that they thought that she has obstructive sleep apnea. She does not think that she can sleep with a CPAP. All of her atrial fibrillation episodes have occurred first thing in the morning prior to medications.  Recent lab work showing total cholesterol 114, LDL 55  EKG shows normal sinus rhythm with rate of 95 beats per minute with no significant ST or T wave changes     Outpatient Encounter Prescriptions as of 12/29/2013  Medication Sig  . albuterol (PROVENTIL HFA;VENTOLIN HFA) 108 (90 BASE) MCG/ACT inhaler Inhale 2 puffs into the lungs every 6 (six) hours as needed. For shortness of breath  . atorvastatin (LIPITOR) 20 MG tablet Take 1 tablet (20 mg total) by mouth daily.  . budesonide-formoterol (SYMBICORT) 160-4.5  MCG/ACT inhaler Inhale 2 puffs into the lungs 2 (two) times daily as needed. For shortness of breath  . chlorpheniramine-HYDROcodone (TUSSIONEX) 10-8 MG/5ML LQCR take 10 milliliters by mouth every 12 hours if needed  . dabigatran (PRADAXA) 150 MG CAPS capsule Take 1 capsule (150 mg total) by mouth 2 (two) times daily.  . Febuxostat (ULORIC PO) Take one by mouth daily, pt unsure of strength  . fluticasone (FLONASE) 50 MCG/ACT nasal spray Place 2 sprays into the nose daily as needed. For allergies   . leflunomide (ARAVA) 20 MG tablet Take 20 mg by mouth daily.  Marland Kitchen lisinopril (PRINIVIL,ZESTRIL) 10 MG tablet take 1 tablet by mouth once daily  . metoprolol succinate (TOPROL-XL) 50 MG 24 hr tablet take 1 tablet by mouth once daily  . Multiple Vitamin (MULTIVITAMIN) tablet Take 1 tablet by mouth daily.    Marland Kitchen nystatin (MYCOSTATIN/NYSTOP) 100000 UNIT/GM POWD Apply to area three times daily as needed for rash  . oxyCODONE (ROXICODONE) 5 MG immediate release tablet 2 tablets every 12 hours  . potassium chloride SA (K-DUR,KLOR-CON) 20 MEQ tablet Take 0.5 tablets (10 mEq total) by mouth 2 (two) times daily. May take extra tab when taking torsemide  . pregabalin (LYRICA) 300 MG capsule Take 1 capsule (300 mg total) by mouth daily.  Marland Kitchen Specialty Vitamins Products (MAGNESIUM, AMINO ACID CHELATE,) 133 MG tablet Take 1 tablet by mouth at bedtime.   Marland Kitchen terazosin (HYTRIN) 10 MG capsule Take 1 capsule (10 mg total) by mouth at bedtime.  . torsemide (DEMADEX) 20 MG tablet Take 20 mg by mouth daily as needed. Edema  . zolpidem (AMBIEN) 10 MG tablet take 1 tablet by mouth at bedtime  Review of Systems  Constitutional: Positive for fatigue.  HENT: Negative.   Eyes: Negative.   Respiratory: Negative.   Cardiovascular: Negative.   Gastrointestinal: Negative.   Endocrine: Negative.   Musculoskeletal: Positive for arthralgias and gait problem.  Skin: Negative.   Allergic/Immunologic: Negative.   Neurological:  Positive for weakness.  Hematological: Negative.   Psychiatric/Behavioral: Negative.     BP 140/72 mmHg  Pulse 95  Ht 5\' 4"  (1.626 m)  Wt 321 lb (145.605 kg)  BMI 55.07 kg/m2  Physical Exam  Constitutional: She is oriented to person, place, and time. She appears well-developed and well-nourished.  HENT:  Head: Normocephalic.  Nose: Nose normal.  Mouth/Throat: Oropharynx is clear and moist.  Eyes: Conjunctivae are normal. Pupils are equal, round, and reactive to light.  Neck: Normal range of motion. Neck supple. No JVD present.  Cardiovascular: Normal rate, regular rhythm, S1 normal, S2 normal, normal heart sounds and intact distal pulses.  Exam reveals no gallop and no friction rub.   No murmur heard. Pulmonary/Chest: Effort normal and breath sounds normal. No respiratory distress. She has no wheezes. She has no rales. She exhibits no tenderness.  Abdominal: Soft. Bowel sounds are normal. She exhibits no distension. There is no tenderness.  Musculoskeletal: Normal range of motion. She exhibits no edema or tenderness.  Lymphadenopathy:    She has no cervical adenopathy.  Neurological: She is alert and oriented to person, place, and time. Coordination normal.  Skin: Skin is warm and dry. No rash noted. No erythema.  Psychiatric: She has a normal mood and affect. Her behavior is normal. Judgment and thought content normal.    Assessment and Plan  Nursing note and vitals reviewed.

## 2014-01-05 ENCOUNTER — Encounter: Payer: Self-pay | Admitting: Family Medicine

## 2014-01-05 ENCOUNTER — Ambulatory Visit (INDEPENDENT_AMBULATORY_CARE_PROVIDER_SITE_OTHER): Payer: Federal, State, Local not specified - PPO | Admitting: Family Medicine

## 2014-01-05 VITALS — BP 130/72 | HR 76 | Temp 97.4°F | Wt 324.2 lb

## 2014-01-05 DIAGNOSIS — M21962 Unspecified acquired deformity of left lower leg: Secondary | ICD-10-CM

## 2014-01-05 DIAGNOSIS — Z23 Encounter for immunization: Secondary | ICD-10-CM

## 2014-01-05 DIAGNOSIS — M069 Rheumatoid arthritis, unspecified: Secondary | ICD-10-CM

## 2014-01-05 DIAGNOSIS — R053 Chronic cough: Secondary | ICD-10-CM

## 2014-01-05 DIAGNOSIS — M21929 Unspecified acquired deformity of unspecified upper arm: Secondary | ICD-10-CM | POA: Insufficient documentation

## 2014-01-05 DIAGNOSIS — J309 Allergic rhinitis, unspecified: Secondary | ICD-10-CM

## 2014-01-05 DIAGNOSIS — R05 Cough: Secondary | ICD-10-CM

## 2014-01-05 LAB — CBC WITH DIFFERENTIAL/PLATELET
Basophils Absolute: 0 10*3/uL (ref 0.0–0.1)
Basophils Relative: 0.6 % (ref 0.0–3.0)
Eosinophils Absolute: 0.5 10*3/uL (ref 0.0–0.7)
Eosinophils Relative: 6.9 % — ABNORMAL HIGH (ref 0.0–5.0)
HCT: 39 % (ref 36.0–46.0)
Hemoglobin: 12.5 g/dL (ref 12.0–15.0)
Lymphocytes Relative: 23.9 % (ref 12.0–46.0)
Lymphs Abs: 1.6 10*3/uL (ref 0.7–4.0)
MCHC: 32.2 g/dL (ref 30.0–36.0)
MCV: 84.7 fl (ref 78.0–100.0)
Monocytes Absolute: 0.5 10*3/uL (ref 0.1–1.0)
Monocytes Relative: 7.6 % (ref 3.0–12.0)
Neutro Abs: 4.1 10*3/uL (ref 1.4–7.7)
Neutrophils Relative %: 61 % (ref 43.0–77.0)
Platelets: 233 10*3/uL (ref 150.0–400.0)
RBC: 4.6 Mil/uL (ref 3.87–5.11)
RDW: 15.9 % — ABNORMAL HIGH (ref 11.5–15.5)
WBC: 6.8 10*3/uL (ref 4.0–10.5)

## 2014-01-05 MED ORDER — OXYCODONE HCL 5 MG PO TABS: ORAL_TABLET | ORAL | Status: DC

## 2014-01-05 MED ORDER — HYDROCOD POLST-CHLORPHEN POLST 10-8 MG/5ML PO LQCR: ORAL | Status: DC

## 2014-01-05 NOTE — Patient Instructions (Signed)
Good to see you. We will call you with your lab results.   

## 2014-01-05 NOTE — Progress Notes (Signed)
Pre visit review using our clinic review tool, if applicable. No additional management support is needed unless otherwise documented below in the visit note. 

## 2014-01-05 NOTE — Assessment & Plan Note (Signed)
Unclear what is causing her left side to be perceived as larger than her right. Will get cbc today as she is concerned about her lymphatics.  I did suggest she talk to Dr. Amil Amen, her rheumatologist about this as well. The patient indicates understanding of these issues and agrees with the plan.

## 2014-01-05 NOTE — Progress Notes (Signed)
Patient ID: Tina Pacheco, female    DOB: 1951/02/22, 63 y.o.   MRN: [id number]  HPI Comments: [name] is a 63 year old with complicated medical history, including  paroxysmal atrial fibrillation here for follow up.  Chronic cough- has episodes of tickle in her throat that leads to terrible cough, often has post tussive emesis. H/o allergic rhinitis/asthma- currently taking flonase, singulair, allegra and symbicort.  Cough is always worse this time of year. CXRs have been neg.  Tussionex has really helped. Would like refill today. No fevers or chills.   No difficulty swallowing.  Neck pain- h/o DJD of cervical spine.  She is working on weight loss which has improved her back pain in general.   Wt Readings from Last 3 Encounters:  01/05/14 324 lb 4 oz (147.079 kg)  12/29/13 321 lb (145.605 kg)  09/16/13 331 lb 8 oz (150.367 kg)   Knee pain- DJD/ RA- bilaterally, right > left.  Seeing Dr. Amil Amen (rheum).  Wearing orthotics and she is aware her weight is an issue.  Tries not to take oxycodone but sometimes she has to. Also taking Lyrica.  She is very concerned about her left side of her body being larger than her right.  Has always had some left hip asymmetry but now feels her left thigh, arms and abdomen are larger than her right.  She feels her clothes have fit differently over past several months.  No h/o scoliosis.  Lab Results  Component Value Date   WBC 4.6 08/19/2012   HGB 12.9 02/10/2013   HCT 38.0 02/10/2013   MCV 92.0 08/19/2012   PLT 188.0 08/19/2012      Outpatient Encounter Prescriptions as of 01/05/2014  Medication Sig  . albuterol (PROVENTIL HFA;VENTOLIN HFA) 108 (90 BASE) MCG/ACT inhaler Inhale 2 puffs into the lungs every 6 (six) hours as needed. For shortness of breath  . atorvastatin (LIPITOR) 20 MG tablet Take 1 tablet (20 mg total) by mouth daily.  . budesonide-formoterol (SYMBICORT) 160-4.5 MCG/ACT inhaler Inhale 2 puffs into the lungs 2 (two) times daily  as needed. For shortness of breath  . chlorpheniramine-HYDROcodone (TUSSIONEX) 10-8 MG/5ML LQCR take 10 milliliters by mouth every 12 hours if needed  . dabigatran (PRADAXA) 150 MG CAPS capsule Take 1 capsule (150 mg total) by mouth 2 (two) times daily.  . Febuxostat (ULORIC PO) Take one by mouth daily, pt unsure of strength  . fluticasone (FLONASE) 50 MCG/ACT nasal spray Place 2 sprays into the nose daily as needed. For allergies   . leflunomide (ARAVA) 20 MG tablet Take 20 mg by mouth daily.  Marland Kitchen lisinopril (PRINIVIL,ZESTRIL) 10 MG tablet take 1 tablet by mouth once daily  . metoprolol succinate (TOPROL-XL) 50 MG 24 hr tablet take 1 tablet by mouth once daily  . Multiple Vitamin (MULTIVITAMIN) tablet Take 1 tablet by mouth daily.    Marland Kitchen nystatin (MYCOSTATIN/NYSTOP) 100000 UNIT/GM POWD Apply to area three times daily as needed for rash  . oxyCODONE (ROXICODONE) 5 MG immediate release tablet 2 tablets every 12 hours  . potassium chloride SA (K-DUR,KLOR-CON) 20 MEQ tablet Take 0.5 tablets (10 mEq total) by mouth 2 (two) times daily. May take extra tab when taking torsemide  . pregabalin (LYRICA) 300 MG capsule Take 1 capsule (300 mg total) by mouth daily.  Marland Kitchen Specialty Vitamins Products (MAGNESIUM, AMINO ACID CHELATE,) 133 MG tablet Take 1 tablet by mouth at bedtime.   Marland Kitchen terazosin (HYTRIN) 10 MG capsule Take 1 capsule (10 mg total) by  mouth at bedtime.  . torsemide (DEMADEX) 20 MG tablet Take 20 mg by mouth daily as needed. Edema  . [DISCONTINUED] zolpidem (AMBIEN) 10 MG tablet take 1 tablet by mouth at bedtime     Review of Systems  See HPI No abdominal pain No LE weakness No erythema or pain in calf  Physical Exam  BP 130/72 mmHg  Pulse 76  Temp(Src) 97.4 F (36.3 C) (Oral)  Wt 324 lb 4 oz (147.079 kg)  SpO2 96%  Constitutional: She is oriented to person, place, and time. She appears well-developed and well-nourished.       obese  HENT:  Head: Normocephalic.  Nose: Nose normal.   Eyes: Conjunctivae are normal. Pupils are equal, round, and reactive to light, no nyastagmus.  Neck: Normal range of motion. Neck supple. No JVD present.  Cardiovascular: Normal rate, regular rhythm, S1 normal, S2 normal, normal heart sounds and intact distal pulses.  Exam reveals no gallop and no friction rub.   No murmur heard. Pulmonary/Chest: Effort normal and breath sounds normal. No respiratory distress. She has no wheezes. She has no rales. She exhibits no tenderness.  Abdominal: Soft. Bowel sounds are normal. She exhibits no distension. There is no tenderness.  Musculoskeletal: Normal range of motion. She exhibits no edema and no tenderness.  Given body habitus, I am unable to appreciate left side of body being larger than right  Lymphadenopathy:    She has no cervical adenopathy.  Neurological: She is alert and oriented to person, place, and time. Coordination normal.  Skin: Skin is warm and dry. No rash noted. No erythema.  Psychiatric: She has a normal mood and affect. Her behavior is normal. Judgment and thought content normal.

## 2014-01-05 NOTE — Assessment & Plan Note (Signed)
Persistent issue. No red flag symptoms or findings on exam. Rx printed for tussionex.

## 2014-01-05 NOTE — Assessment & Plan Note (Signed)
Deteriorated due to seasonal allergens. Continue current rx.

## 2014-02-03 ENCOUNTER — Other Ambulatory Visit: Payer: Self-pay | Admitting: *Deleted

## 2014-02-03 MED ORDER — TERAZOSIN HCL 10 MG PO CAPS: 10.0000 mg | ORAL_CAPSULE | Freq: Every day | ORAL | Status: DC

## 2014-03-11 ENCOUNTER — Encounter: Payer: Self-pay | Admitting: Family Medicine

## 2014-03-11 ENCOUNTER — Other Ambulatory Visit: Payer: Self-pay

## 2014-03-11 MED ORDER — OXYCODONE HCL 5 MG PO TABS: ORAL_TABLET | ORAL | Status: DC

## 2014-03-11 MED ORDER — HYDROCOD POLST-CHLORPHEN POLST 10-8 MG/5ML PO LQCR: ORAL | Status: DC

## 2014-03-11 NOTE — Telephone Encounter (Signed)
Spoke to pt and informed her Rx is available for pickup at the front desk. Pt advised third party unable to pickup 

## 2014-03-11 NOTE — Telephone Encounter (Signed)
Pt left v/m requesting rx tussionex and oxycodone. Call when ready for pick up. Pt last seen 01/05/14.

## 2014-03-17 ENCOUNTER — Other Ambulatory Visit: Payer: Self-pay | Admitting: Family Medicine

## 2014-03-25 ENCOUNTER — Encounter: Payer: Self-pay | Admitting: Family Medicine

## 2014-03-28 ENCOUNTER — Inpatient Hospital Stay: Payer: Self-pay | Admitting: Surgery

## 2014-03-28 LAB — URINALYSIS, COMPLETE
BILIRUBIN, UR: NEGATIVE
Bacteria: NONE SEEN
Blood: NEGATIVE
Glucose,UR: NEGATIVE mg/dL (ref 0–75)
Ketone: NEGATIVE
Nitrite: NEGATIVE
PH: 5 (ref 4.5–8.0)
Specific Gravity: 1.018 (ref 1.003–1.030)
WBC UR: 72 /HPF (ref 0–5)

## 2014-03-28 LAB — COMPREHENSIVE METABOLIC PANEL
Albumin: 3.1 g/dL — ABNORMAL LOW (ref 3.4–5.0)
Alkaline Phosphatase: 92 U/L (ref 46–116)
Anion Gap: 7 (ref 7–16)
BUN: 14 mg/dL (ref 7–18)
Bilirubin,Total: 0.9 mg/dL (ref 0.2–1.0)
CALCIUM: 9.8 mg/dL (ref 8.5–10.1)
Chloride: 109 mmol/L — ABNORMAL HIGH (ref 98–107)
Co2: 24 mmol/L (ref 21–32)
Creatinine: 1.06 mg/dL (ref 0.60–1.30)
EGFR (Non-African Amer.): 55 — ABNORMAL LOW
GLUCOSE: 109 mg/dL — AB (ref 65–99)
Osmolality: 280 (ref 275–301)
POTASSIUM: 4 mmol/L (ref 3.5–5.1)
SGOT(AST): 35 U/L (ref 15–37)
SGPT (ALT): 18 U/L (ref 14–63)
Sodium: 140 mmol/L (ref 136–145)
Total Protein: 6.8 g/dL (ref 6.4–8.2)

## 2014-03-28 LAB — CBC
HCT: 36.6 % (ref 35.0–47.0)
HGB: 11.8 g/dL — AB (ref 12.0–16.0)
MCH: 27.3 pg (ref 26.0–34.0)
MCHC: 32.4 g/dL (ref 32.0–36.0)
MCV: 85 fL (ref 80–100)
Platelet: 191 10*3/uL (ref 150–440)
RBC: 4.33 10*6/uL (ref 3.80–5.20)
RDW: 14.9 % — ABNORMAL HIGH (ref 11.5–14.5)
WBC: 7.5 10*3/uL (ref 3.6–11.0)

## 2014-03-28 LAB — PROTIME-INR
INR: 1.1
PROTHROMBIN TIME: 13.9 s

## 2014-03-28 LAB — LIPASE, BLOOD: LIPASE: 48 U/L — AB (ref 73–393)

## 2014-03-28 LAB — TROPONIN I: Troponin-I: 0.04 ng/mL

## 2014-03-28 LAB — APTT

## 2014-03-29 LAB — BASIC METABOLIC PANEL
Anion Gap: 6 — ABNORMAL LOW (ref 7–16)
BUN: 11 mg/dL (ref 7–18)
CALCIUM: 9.2 mg/dL (ref 8.5–10.1)
Chloride: 111 mmol/L — ABNORMAL HIGH (ref 98–107)
Co2: 24 mmol/L (ref 21–32)
Creatinine: 0.9 mg/dL (ref 0.60–1.30)
Glucose: 104 mg/dL — ABNORMAL HIGH (ref 65–99)
OSMOLALITY: 281 (ref 275–301)
Potassium: 3.6 mmol/L (ref 3.5–5.1)
SODIUM: 141 mmol/L (ref 136–145)

## 2014-03-29 LAB — CBC WITH DIFFERENTIAL/PLATELET
BASOS ABS: 0 10*3/uL (ref 0.0–0.1)
BASOS PCT: 0.7 %
EOS ABS: 0.1 10*3/uL (ref 0.0–0.7)
Eosinophil %: 2.3 %
HCT: 31.6 % — AB (ref 35.0–47.0)
HGB: 10.2 g/dL — AB (ref 12.0–16.0)
LYMPHS ABS: 2.1 10*3/uL (ref 1.0–3.6)
Lymphocyte %: 36.1 %
MCH: 27.1 pg (ref 26.0–34.0)
MCHC: 32.2 g/dL (ref 32.0–36.0)
MCV: 84 fL (ref 80–100)
Monocyte #: 0.6 x10 3/mm (ref 0.2–0.9)
Monocyte %: 10.1 %
NEUTROS ABS: 2.9 10*3/uL (ref 1.4–6.5)
Neutrophil %: 50.8 %
Platelet: 164 10*3/uL (ref 150–440)
RBC: 3.76 10*6/uL — ABNORMAL LOW (ref 3.80–5.20)
RDW: 15.3 % — AB (ref 11.5–14.5)
WBC: 5.7 10*3/uL (ref 3.6–11.0)

## 2014-03-30 ENCOUNTER — Telehealth: Payer: Self-pay | Admitting: *Deleted

## 2014-03-30 NOTE — Telephone Encounter (Signed)
PLEASE NOTE: All timestamps contained within this report are represented as [place] Standard Time. CONFIDENTIALTY NOTICE: This fax transmission is intended only for the addressee. It contains information that is legally privileged, confidential or otherwise protected from use or disclosure. If you are not the intended recipient, you are strictly prohibited from reviewing, disclosing, copying using or disseminating any of this information or taking any action in reliance on or regarding this information. If you have received this fax in error, please notify us immediately by telephone so that we can arrange for its return to [place]. Phone: [phone], Toll-Free: [phone], Fax: [phone] Page: 1 of 2 Call Id: [id number] [place] Patient Name: Tina Pacheco Gender: Female DOB: 1951-02-21 Age: 64 Y 1 M 22 D Return Phone Number: [phone] (Primary) Address: City/State/Zip: [place] Client [place] Night - Client Client Site [place] Physician Arnette Norris Contact Type Call Call Type Triage / Clinical Relationship To Patient Self Return Phone Number [phone] (Primary) Chief Complaint Vomiting Initial Comment Caller states she is vomiting with diarrhea. PreDisposition Did not know what to do Nurse Assessment Nurse: Malva Cogan, RN, [name] Date/Time [name]: 03/28/2014 8:41:30 AM Confirm and document reason for call. If symptomatic, describe symptoms. ---Caller states that she had onset of vomiting & diarrhea yesterday afternoon. Has the patient traveled out of the country within the last 30 days? ---No Does the patient require triage? ---Yes Related visit to physician within the last 2 weeks? ---No Does the PT have any chronic conditions? (i.e. diabetes, asthma, etc.) ---Yes List chronic conditions. ---RA, atrial fibrillation,  chronic sinus problems, HTN, high cholesterol Guidelines Guideline Title Affirmed Question Affirmed Notes Nurse Date/Time (Eastern Time) Vomiting [1] MODERATE vomiting (e.g., 3 - 5 times/day) AND [2] age > 76 Malva Cogan, RNJuliann Pulse 03/28/2014 8:43:35 AM Disp. Time [name] Disposition Final User 03/28/2014 8:45:52 AM Go to ED Now (or PCP triage) Yes Malva Cogan, RN, Gara Kroner Understands: Yes Disagree/Comply: Comply Care Advice Given Per Guideline PLEASE NOTE: All timestamps contained within this report are represented as [place] Standard Time. CONFIDENTIALTY NOTICE: This fax transmission is intended only for the addressee. It contains information that is legally privileged, confidential or otherwise protected from use or disclosure. If you are not the intended recipient, you are strictly prohibited from reviewing, disclosing, copying using or disseminating any of this information or taking any action in reliance on or regarding this information. If you have received this fax in error, please notify us immediately by telephone so that we can arrange for its return to [place]. Phone: [phone], Toll-Free: [phone], Fax: [phone] Page: 2 of 2 Call Id: [id number] Care Advice Given Per Guideline * IF NO PCP TRIAGE: You need to be seen. Go to the [facility] at _[facility] within the next hour. Leave as soon as you can. CONTAINER: You may wish to bring a bucket or container with you in case there is more vomiting during the drive. BRING MEDICINES: * Please bring a list of your current medicines when you go to see the doctor. * It is also a good idea to bring the pill bottles too. This will help the doctor to make certain you are taking the right medicines and the right dose. CARE ADVICE per Vomiting (Adult) guideline. After Care Instructions Given Call Event Type User Date / Time Description Referrals [facility] - ED

## 2014-04-04 ENCOUNTER — Emergency Department: Payer: Self-pay | Admitting: Emergency Medicine

## 2014-04-07 NOTE — Telephone Encounter (Signed)
Please close encounter if completed

## 2014-04-08 ENCOUNTER — Ambulatory Visit (INDEPENDENT_AMBULATORY_CARE_PROVIDER_SITE_OTHER): Payer: Federal, State, Local not specified - PPO | Admitting: Family Medicine

## 2014-04-08 ENCOUNTER — Encounter: Payer: Self-pay | Admitting: Family Medicine

## 2014-04-08 VITALS — BP 146/90 | HR 69 | Temp 97.4°F | Wt 319.8 lb

## 2014-04-08 DIAGNOSIS — G43709 Chronic migraine without aura, not intractable, without status migrainosus: Secondary | ICD-10-CM

## 2014-04-08 DIAGNOSIS — R112 Nausea with vomiting, unspecified: Secondary | ICD-10-CM | POA: Insufficient documentation

## 2014-04-08 DIAGNOSIS — G43909 Migraine, unspecified, not intractable, without status migrainosus: Secondary | ICD-10-CM | POA: Insufficient documentation

## 2014-04-08 MED ORDER — SUMATRIPTAN SUCCINATE 25 MG PO TABS: 25.0000 mg | ORAL_TABLET | ORAL | Status: DC | PRN

## 2014-04-08 NOTE — Assessment & Plan Note (Signed)
New- does seem very migrainous in nature. Discussed migraines with Ms. Mcgahan, advised keeping a HA journal. eRx for imitrex sent to her pharmacy for abortive therapy- we discussed sedation precautions. She will update me in 1 month. The patient indicates understanding of these issues and agrees with the plan.

## 2014-04-08 NOTE — Assessment & Plan Note (Signed)
Resolved- recent episode likely associated with her migraine. Previous episode- Ileus vs gastroenteritis.

## 2014-04-08 NOTE — Patient Instructions (Addendum)
Good to see you. Keep a headache journal.  Take Imitrex as needed at onset of a migraine.  May repeat 1 time 2 hours later if headache persists.  Please call me next time you get a migraine.

## 2014-04-08 NOTE — Progress Notes (Signed)
Pre visit review using our clinic review tool, if applicable. No additional management support is needed unless otherwise documented below in the visit note. 

## 2014-04-08 NOTE — Progress Notes (Signed)
Subjective:   Patient ID: Tina Pacheco, female    DOB: 26-Dec-1950, 64 y.o.   MRN: [id number]  Tina Pacheco is a pleasant 64 y.o. year old female who presents to clinic today with Hospitalization Follow-up  on 04/08/2014  HPI:  Nausea/vomiting- went to [facility] ER on 1/30- with 2-3 days of nausea and vomiting. Admitted, UA pos- treated for UTI. KUB showed ? Obstruction/mass- CT confirmed this.  Surgery consulted- felt she had an ileus and advised bowel rest and repeat imaging.  Repeat imaging next day showed that mass/obstruction had resolved. D/c'd home on 2/1 and felt good.  On 2/6- severe headache with nausea, vomiting, photophobia. Returned to [facility] ED- notes reviewed.  Head CT neg. Given fiorecet and zofran and sent home. HA resolved.  Years ago, had a h/o severe migraines but has not had them since.  Current Outpatient Prescriptions on File Prior to Visit  Medication Sig Dispense Refill  . albuterol (PROVENTIL HFA;VENTOLIN HFA) 108 (90 BASE) MCG/ACT inhaler Inhale 2 puffs into the lungs every 6 (six) hours as needed. For shortness of breath 3 Inhaler 1  . atorvastatin (LIPITOR) 20 MG tablet Take 1 tablet (20 mg total) by mouth daily. 90 tablet 3  . budesonide-formoterol (SYMBICORT) 160-4.5 MCG/ACT inhaler Inhale 2 puffs into the lungs 2 (two) times daily as needed. For shortness of breath 3 Inhaler 1  . chlorpheniramine-HYDROcodone (TUSSIONEX) 10-8 MG/5ML LQCR take 10 milliliters by mouth every 12 hours if needed 480 mL 0  . dabigatran (PRADAXA) 150 MG CAPS capsule Take 1 capsule (150 mg total) by mouth 2 (two) times daily. 180 capsule 3  . Febuxostat (ULORIC PO) Take one by mouth daily, pt unsure of strength    . fluticasone (FLONASE) 50 MCG/ACT nasal spray Place 2 sprays into the nose daily as needed. For allergies     . leflunomide (ARAVA) 20 MG tablet Take 20 mg by mouth daily.    Marland Kitchen lisinopril (PRINIVIL,ZESTRIL) 10 MG tablet take 1 tablet by mouth once daily 90 tablet 1  .  metoprolol succinate (TOPROL-XL) 50 MG 24 hr tablet take 1 tablet by mouth once daily 30 tablet 6  . Multiple Vitamin (MULTIVITAMIN) tablet Take 1 tablet by mouth daily.      Marland Kitchen nystatin (MYCOSTATIN/NYSTOP) 100000 UNIT/GM POWD Apply to area three times daily as needed for rash 60 g 0  . oxyCODONE (ROXICODONE) 5 MG immediate release tablet 2 tablets every 12 hours 270 tablet 0  . potassium chloride SA (K-DUR,KLOR-CON) 20 MEQ tablet Take 0.5 tablets (10 mEq total) by mouth 2 (two) times daily. May take extra tab when taking torsemide 180 tablet 3  . pregabalin (LYRICA) 300 MG capsule Take 1 capsule (300 mg total) by mouth daily.    Marland Kitchen Specialty Vitamins Products (MAGNESIUM, AMINO ACID CHELATE,) 133 MG tablet Take 1 tablet by mouth at bedtime.     Marland Kitchen terazosin (HYTRIN) 10 MG capsule Take 1 capsule (10 mg total) by mouth at bedtime. 90 capsule 0  . torsemide (DEMADEX) 20 MG tablet Take 20 mg by mouth daily as needed. Edema     No current facility-administered medications on file prior to visit.    Allergies  Allergen Reactions  . Penicillins     Childhood allergies. But has tolerated amoxicillin and other cillins since     Past Medical History  Diagnosis Date  . Hypertension   . Dyslipidemia   . Arrhythmia     paroxysmal atrial fibrillation  . CHF (congestive  heart failure)     chronic diastolic  . Morbid obesity   . Hypokalemia   . Coronary artery disease     nonobstructive CAD  . Asthma   . Rheumatoid arthritis(714.0)   . Basal cell carcinoma   . Hyperplastic colonic polyp 2003  . History of gout   . COPD (chronic obstructive pulmonary disease)   . Atrial fibrillation     Past Surgical History  Procedure Laterality Date  . Cardiac catheterization  05/24/2010    nonobstructive CAD  . Knee arthroscopy      bilateral  . Vaginal hysterectomy    . Cholecystectomy    . Cesarean section    . Colonoscopy  06/12/2011    Procedure: COLONOSCOPY;  Surgeon: Juanita Craver, MD;  Location:  [facility];  Service: Endoscopy;  Laterality: N/A;  . Colonoscopy N/A 03/17/2013    Procedure: COLONOSCOPY;  Surgeon: Juanita Craver, MD;  Location: [facility];  Service: Endoscopy;  Laterality: N/A;    Family History  Problem Relation Age of Onset  . Tuberculosis Mother   . Parkinsonism Mother   . Diabetes type II Sister   . Breast cancer Sister   . Breast cancer Maternal Aunt     History   Social History  . Marital Status: Married    Spouse Name: N/A  . Number of Children: N/A  . Years of Education: N/A   Occupational History  . Not on file.   Social History Main Topics  . Smoking status: Never Smoker   . Smokeless tobacco: Never Used  . Alcohol Use: Yes     Comment: occasional-red wine  . Drug Use: No  . Sexual Activity: Not on file   Other Topics Concern  . Not on file   Social History Narrative   The PMH, PSH, Social History, Family History, Medications, and allergies have been reviewed in [facility], and have been updated if relevant.   Review of Systems  Constitutional: Negative for fever.  HENT: Negative.   Cardiovascular: Negative.   Gastrointestinal: Positive for nausea, vomiting, abdominal pain and diarrhea. Negative for constipation and blood in stool.  Genitourinary: Negative.   Musculoskeletal: Negative.   Skin: Negative.   Psychiatric/Behavioral: Negative.        Objective:    BP 146/90 mmHg  Pulse 69  Temp(Src) 97.4 F (36.3 C) (Oral)  Wt 319 lb 12 oz (145.038 kg)  SpO2 95%   Physical Exam  Constitutional: She is oriented to person, place, and time. She appears well-developed and well-nourished.  Morbidly obese  HENT:  Head: Normocephalic and atraumatic.  Eyes: Conjunctivae are normal.  Neck: Normal range of motion.  Cardiovascular: Normal rate.   Pulmonary/Chest: Effort normal.  Abdominal: Soft.  Musculoskeletal: She exhibits no edema.  Neurological: She is alert and oriented to person, place, and time. No cranial nerve deficit.    Skin: Skin is warm and dry.  Psychiatric: She has a normal mood and affect. Her behavior is normal. Judgment and thought content normal.  Nursing note and vitals reviewed.         Assessment & Plan:   Chronic migraine without aura without status migrainosus, not intractable  Non-intractable vomiting with nausea, vomiting of unspecified type No Follow-up on file.

## 2014-05-08 ENCOUNTER — Other Ambulatory Visit: Payer: Self-pay

## 2014-05-08 MED ORDER — TORSEMIDE 20 MG PO TABS: 20.0000 mg | ORAL_TABLET | Freq: Every day | ORAL | Status: DC | PRN

## 2014-05-08 NOTE — Telephone Encounter (Signed)
Pt left v/m requesting 90 day refill torsemide to [facility].DR Deborra Medina HAS NOT REFILLED SINCE 2013.Please advise. Pt last seen hospital f/u on 04/08/14.

## 2014-05-08 NOTE — Telephone Encounter (Signed)
Ok to refill 

## 2014-05-08 NOTE — Addendum Note (Signed)
Addended by: Modena Nunnery on: 05/08/2014 04:48 PM   Modules accepted: Orders

## 2014-05-08 NOTE — Telephone Encounter (Signed)
Spoke to pt who states that she is aware that she has not had Rx since 2013. She has been using her 2013 Rx on a prn basis. Advised pt this Rx is definitely expired, and should discard it immediately. Pt states that whenever she gains 5lbs in a week, she takes it, and "doubles up" on her K+

## 2014-05-14 ENCOUNTER — Other Ambulatory Visit: Payer: Self-pay | Admitting: Family Medicine

## 2014-05-26 ENCOUNTER — Ambulatory Visit (INDEPENDENT_AMBULATORY_CARE_PROVIDER_SITE_OTHER): Payer: Federal, State, Local not specified - PPO | Admitting: Family Medicine

## 2014-05-26 ENCOUNTER — Encounter: Payer: Self-pay | Admitting: Family Medicine

## 2014-05-26 VITALS — BP 142/84 | HR 66 | Temp 97.5°F | Wt 321.8 lb

## 2014-05-26 DIAGNOSIS — G43709 Chronic migraine without aura, not intractable, without status migrainosus: Secondary | ICD-10-CM | POA: Diagnosis not present

## 2014-05-26 DIAGNOSIS — M1000 Idiopathic gout, unspecified site: Secondary | ICD-10-CM | POA: Diagnosis not present

## 2014-05-26 DIAGNOSIS — M069 Rheumatoid arthritis, unspecified: Secondary | ICD-10-CM | POA: Diagnosis not present

## 2014-05-26 DIAGNOSIS — R202 Paresthesia of skin: Secondary | ICD-10-CM | POA: Diagnosis not present

## 2014-05-26 DIAGNOSIS — G894 Chronic pain syndrome: Secondary | ICD-10-CM

## 2014-05-26 DIAGNOSIS — M79609 Pain in unspecified limb: Secondary | ICD-10-CM | POA: Diagnosis not present

## 2014-05-26 DIAGNOSIS — M109 Gout, unspecified: Secondary | ICD-10-CM | POA: Insufficient documentation

## 2014-05-26 LAB — CBC WITH DIFFERENTIAL/PLATELET
Basophils Absolute: 0 10*3/uL (ref 0.0–0.1)
Basophils Relative: 0.6 % (ref 0.0–3.0)
Eosinophils Absolute: 0.3 10*3/uL (ref 0.0–0.7)
Eosinophils Relative: 4.3 % (ref 0.0–5.0)
HCT: 36.6 % (ref 36.0–46.0)
Hemoglobin: 12 g/dL (ref 12.0–15.0)
Lymphocytes Relative: 30.1 % (ref 12.0–46.0)
Lymphs Abs: 2.4 10*3/uL (ref 0.7–4.0)
MCHC: 32.8 g/dL (ref 30.0–36.0)
MCV: 83.8 fl (ref 78.0–100.0)
MONOS PCT: 6.1 % (ref 3.0–12.0)
Monocytes Absolute: 0.5 10*3/uL (ref 0.1–1.0)
NEUTROS PCT: 58.9 % (ref 43.0–77.0)
Neutro Abs: 4.6 10*3/uL (ref 1.4–7.7)
PLATELETS: 223 10*3/uL (ref 150.0–400.0)
RBC: 4.38 Mil/uL (ref 3.87–5.11)
RDW: 16.9 % — AB (ref 11.5–15.5)
WBC: 7.9 10*3/uL (ref 4.0–10.5)

## 2014-05-26 LAB — COMPREHENSIVE METABOLIC PANEL
ALT: 15 U/L (ref 0–35)
AST: 16 U/L (ref 0–37)
Albumin: 3.6 g/dL (ref 3.5–5.2)
Alkaline Phosphatase: 89 U/L (ref 39–117)
BUN: 23 mg/dL (ref 6–23)
CHLORIDE: 107 meq/L (ref 96–112)
CO2: 29 mEq/L (ref 19–32)
Calcium: 10.2 mg/dL (ref 8.4–10.5)
Creatinine, Ser: 0.86 mg/dL (ref 0.40–1.20)
GFR: 70.54 mL/min (ref >60.00)
Glucose, Bld: 78 mg/dL (ref 70–99)
Potassium: 4.2 mEq/L (ref 3.5–5.1)
Sodium: 139 mEq/L (ref 135–145)
Total Bilirubin: 0.8 mg/dL (ref 0.2–1.2)
Total Protein: 6.5 g/dL (ref 6.0–8.3)

## 2014-05-26 LAB — TSH: TSH: 2.46 u[IU]/mL (ref 0.35–4.50)

## 2014-05-26 LAB — VITAMIN B12: Vitamin B-12: 341 pg/mL (ref 211–911)

## 2014-05-26 MED ORDER — OXYCODONE HCL 5 MG PO TABS: ORAL_TABLET | ORAL | Status: DC

## 2014-05-26 NOTE — Assessment & Plan Note (Signed)
>  25 minutes spent in face to face time with patient, >50% spent in counselling or coordination of care Deteriorated- multifactorial- obesity, RA, OA, gout- followed by rheum.  Uric acid has been normal. Rx printed for oxycodone.  I explained to her that once she becomes tolerant of this dose, it will be beyond what I am comfortable managing and will refer her to pain clinic. She she plans to not take it daily once her acute issues improve, she will update me with her progress. The patient indicates understanding of these issues and agrees with the plan.

## 2014-05-26 NOTE — Assessment & Plan Note (Signed)
Likely multifactorial- mainly from arthritis. Will check labs to rule out other possible contributing factors. Continue lyrica. Orders Placed This Encounter  Procedures  . Vitamin B12  . TSH  . CBC with Differential/Platelet  . Comprehensive metabolic panel   The patient indicates understanding of these issues and agrees with the plan.

## 2014-05-26 NOTE — Patient Instructions (Signed)
Good to see you. We will call you with your lab results.   

## 2014-05-26 NOTE — Assessment & Plan Note (Signed)
No recurrent symptoms. She will keep me updated.

## 2014-05-26 NOTE — Progress Notes (Signed)
Pre visit review using our clinic review tool, if applicable. No additional management support is needed unless otherwise documented below in the visit note. 

## 2014-05-26 NOTE — Progress Notes (Signed)
Subjective:   Patient ID: Tina Pacheco, female    DOB: [birth year], 64 y.o.   MRN: [id number]  Tina Pacheco is a pleasant 64 y.o. year old female who presents to clinic today with Follow-up and Foot Pain  on 05/26/2014  HPI:  Saw her last month for ER follow up on 04/08/14.  Had been to ER on 2/6 for severe headache associated with nausea, vomiting, photophobia.  Head CT was neg and she was given Fioricet and zofran in ER.  Advised keeping HA journal.   eRx sent for imitrex sent at that time.  Has not had a headache since and therefore has needed to take Imitrex.  Chronic pain- followed by Dr. Amil Amen (rheum) for RA, OA, and gout. Was last seen in 02/2014- note reviewed. I prescribe her oxycodone which she is now taking twice daily since her foot and knee pain has been worse. Does not drive after taking them and plans to not take them daily once her pain improved- tripped and fell a few weeks ago which worsened her pain.  She has been having more RLE paresthesia- usually happen when she is driving or sitting. She is taking Lyrica as directed.  Lab Results  Component Value Date   [id number] 195 08/19/2012   Lab Results  Component Value Date   WBC 6.8 01/05/2014   HGB 12.5 01/05/2014   HCT 39.0 01/05/2014   MCV 84.7 01/05/2014   PLT 233.0 01/05/2014   Lab Results  Component Value Date   TSH 1.960 08/30/2010      Current Outpatient Prescriptions on File Prior to Visit  Medication Sig Dispense Refill  . albuterol (PROVENTIL HFA;VENTOLIN HFA) 108 (90 BASE) MCG/ACT inhaler Inhale 2 puffs into the lungs every 6 (six) hours as needed. For shortness of breath 3 Inhaler 1  . atorvastatin (LIPITOR) 20 MG tablet Take 1 tablet (20 mg total) by mouth daily. 90 tablet 3  . budesonide-formoterol (SYMBICORT) 160-4.5 MCG/ACT inhaler Inhale 2 puffs into the lungs 2 (two) times daily as needed. For shortness of breath 3 Inhaler 1  . chlorpheniramine-HYDROcodone (TUSSIONEX) 10-8 MG/5ML  LQCR take 10 milliliters by mouth every 12 hours if needed 480 mL 0  . dabigatran (PRADAXA) 150 MG CAPS capsule Take 1 capsule (150 mg total) by mouth 2 (two) times daily. 180 capsule 3  . Febuxostat (ULORIC PO) Take one by mouth daily, pt unsure of strength    . fluticasone (FLONASE) 50 MCG/ACT nasal spray Place 2 sprays into the nose daily as needed. For allergies     . leflunomide (ARAVA) 20 MG tablet Take 20 mg by mouth daily.    Marland Kitchen lisinopril (PRINIVIL,ZESTRIL) 10 MG tablet take 1 tablet by mouth once daily 90 tablet 1  . metoprolol succinate (TOPROL-XL) 50 MG 24 hr tablet take 1 tablet by mouth once daily 30 tablet 6  . Multiple Vitamin (MULTIVITAMIN) tablet Take 1 tablet by mouth daily.      Marland Kitchen nystatin (MYCOSTATIN/NYSTOP) 100000 UNIT/GM POWD Apply to area three times daily as needed for rash 60 g 0  . oxyCODONE (ROXICODONE) 5 MG immediate release tablet 2 tablets every 12 hours 270 tablet 0  . potassium chloride SA (K-DUR,KLOR-CON) 20 MEQ tablet Take 0.5 tablets (10 mEq total) by mouth 2 (two) times daily. May take extra tab when taking torsemide 180 tablet 3  . pregabalin (LYRICA) 300 MG capsule Take 1 capsule (300 mg total) by mouth daily.    Marland Kitchen Specialty Vitamins Products (MAGNESIUM,  AMINO ACID CHELATE,) 133 MG tablet Take 1 tablet by mouth at bedtime.     . SUMAtriptan (IMITREX) 25 MG tablet Take 1 tablet (25 mg total) by mouth every 2 (two) hours as needed for migraine or headache. May repeat in 2 hours if headache persists or recurs. 10 tablet 0  . terazosin (HYTRIN) 10 MG capsule take 1 capsule by mouth at bedtime 90 capsule 0  . torsemide (DEMADEX) 20 MG tablet Take 1 tablet (20 mg total) by mouth daily as needed. Edema 30 tablet 0   No current facility-administered medications on file prior to visit.    Allergies  Allergen Reactions  . Penicillins     Childhood allergies. But has tolerated amoxicillin and other cillins since     Past Medical History  Diagnosis Date  .  Hypertension   . Dyslipidemia   . Arrhythmia     paroxysmal atrial fibrillation  . CHF (congestive heart failure)     chronic diastolic  . Morbid obesity   . Hypokalemia   . Coronary artery disease     nonobstructive CAD  . Asthma   . Rheumatoid arthritis(714.0)   . Basal cell carcinoma   . Hyperplastic colonic polyp 2003  . History of gout   . COPD (chronic obstructive pulmonary disease)   . Atrial fibrillation     Past Surgical History  Procedure Laterality Date  . Cardiac catheterization  05/24/2010    nonobstructive CAD  . Knee arthroscopy      bilateral  . Vaginal hysterectomy    . Cholecystectomy    . Cesarean section    . Colonoscopy  06/12/2011    Procedure: COLONOSCOPY;  Surgeon: Juanita Craver, MD;  Location: [facility];  Service: Endoscopy;  Laterality: N/A;  . Colonoscopy N/A 03/17/2013    Procedure: COLONOSCOPY;  Surgeon: Juanita Craver, MD;  Location: [facility];  Service: Endoscopy;  Laterality: N/A;    Family History  Problem Relation Age of Onset  . Tuberculosis Mother   . Parkinsonism Mother   . Diabetes type II Sister   . Breast cancer Sister   . Breast cancer Maternal Aunt     History   Social History  . Marital Status: Married    Spouse Name: N/A  . Number of Children: N/A  . Years of Education: N/A   Occupational History  . Not on file.   Social History Main Topics  . Smoking status: Never Smoker   . Smokeless tobacco: Never Used  . Alcohol Use: Yes     Comment: occasional-red wine  . Drug Use: No  . Sexual Activity: Not on file   Other Topics Concern  . Not on file   Social History Narrative   The PMH, PSH, Social History, Family History, Medications, and allergies have been reviewed in [facility], and have been updated if relevant.    Review of Systems  Constitutional: Negative.   HENT: Negative.   Respiratory: Negative.   Cardiovascular: Negative.   Musculoskeletal: Positive for myalgias, back pain, joint swelling, arthralgias  and gait problem.  Neurological: Positive for numbness. Negative for syncope, speech difficulty, weakness, light-headedness and headaches.  Psychiatric/Behavioral: Negative.   All other systems reviewed and are negative.      Objective:    BP 142/84 mmHg  Pulse 66  Temp(Src) 97.5 F (36.4 C) (Oral)  Wt 321 lb 12 oz (145.945 kg)  SpO2 97% Wt Readings from Last 3 Encounters:  05/26/14 321 lb 12 oz (145.945 kg)  04/08/14  319 lb 12 oz (145.038 kg)  01/05/14 324 lb 4 oz (147.079 kg)      Physical Exam  Constitutional: She is oriented to person, place, and time. She appears well-developed and well-nourished. No distress.  obese  HENT:  Head: Normocephalic.  Eyes: Conjunctivae are normal.  Neck: Normal range of motion.  Cardiovascular: Normal rate.   Pulmonary/Chest: Effort normal.  Musculoskeletal: Normal range of motion.  Neurological: She is alert and oriented to person, place, and time. No cranial nerve deficit.  Skin: Skin is warm and dry.  Psychiatric: She has a normal mood and affect. Her behavior is normal. Judgment and thought content normal.          Assessment & Plan:   Chronic migraine without aura without status migrainosus, not intractable  Paresthesia and pain of right extremity - Plan: Vitamin B12, TSH, CBC with Differential/Platelet, Comprehensive metabolic panel  Rheumatoid arthritis  Idiopathic gout, unspecified chronicity, unspecified site No Follow-up on file.

## 2014-05-28 ENCOUNTER — Other Ambulatory Visit: Payer: Self-pay

## 2014-05-28 MED ORDER — HYDROCOD POLST-CHLORPHEN POLST 10-8 MG/5ML PO LQCR: ORAL | Status: DC

## 2014-05-28 NOTE — Telephone Encounter (Signed)
Seen 05/26/14; pt developed non prod cough and request rx tussionex; no fever, wheezing or SOB. Call pt when ready for pick up.

## 2014-05-28 NOTE — Telephone Encounter (Signed)
Spoke to pt and informed her Rx is available for pickup from the front desk 

## 2014-06-17 ENCOUNTER — Ambulatory Visit (INDEPENDENT_AMBULATORY_CARE_PROVIDER_SITE_OTHER): Payer: Federal, State, Local not specified - PPO | Admitting: Cardiovascular Disease

## 2014-06-17 ENCOUNTER — Encounter: Payer: Self-pay | Admitting: Cardiovascular Disease

## 2014-06-17 VITALS — BP 120/66 | HR 74 | Ht 64.0 in | Wt 320.5 lb

## 2014-06-17 DIAGNOSIS — R05 Cough: Secondary | ICD-10-CM

## 2014-06-17 DIAGNOSIS — R053 Chronic cough: Secondary | ICD-10-CM

## 2014-06-17 DIAGNOSIS — I10 Essential (primary) hypertension: Secondary | ICD-10-CM | POA: Diagnosis not present

## 2014-06-17 DIAGNOSIS — I480 Paroxysmal atrial fibrillation: Secondary | ICD-10-CM

## 2014-06-17 DIAGNOSIS — I25119 Atherosclerotic heart disease of native coronary artery with unspecified angina pectoris: Secondary | ICD-10-CM | POA: Diagnosis not present

## 2014-06-17 DIAGNOSIS — E669 Obesity, unspecified: Secondary | ICD-10-CM

## 2014-06-17 DIAGNOSIS — E785 Hyperlipidemia, unspecified: Secondary | ICD-10-CM

## 2014-06-17 MED ORDER — AZITHROMYCIN 250 MG PO TABS: ORAL_TABLET | ORAL | Status: DC

## 2014-06-17 NOTE — Assessment & Plan Note (Signed)
Encouraged her to stay on her anticoagulation. Maintaining normal sinus rhythm High risk of recurrent atrial fibrillation given untreated sleep apnea

## 2014-06-17 NOTE — Assessment & Plan Note (Signed)
Cholesterol is at goal on the current lipid regimen. No changes to the medications were made.  

## 2014-06-17 NOTE — Assessment & Plan Note (Signed)
She reports worsening cough in the past 3 weeks, now with a rattle, thick, wet, nonproductive. Encouraged her to take her torsemide more faithfully given her worsening cough . Unable to exclude acute on chronic diastolic CHF though no clinical signs otherwise of CHF .no lower extremity edema . Unable to exclude a bronchitis given the acute onset and persistent nature of her symptoms . After long discussion, she has requested a Z-Pak. Order has been placed

## 2014-06-17 NOTE — Assessment & Plan Note (Signed)
We have encouraged  careful diet management in an effort to lose weight. Unable to exercise on a regular basis

## 2014-06-17 NOTE — Assessment & Plan Note (Signed)
Blood pressure is well controlled on today's visit. No changes made to the medications. 

## 2014-06-17 NOTE — Assessment & Plan Note (Signed)
Currently with no symptoms of angina. No further workup at this time. Continue current medication regimen. 

## 2014-06-17 NOTE — Patient Instructions (Signed)
You are doing well.  Please start Z-pak 2 pills the first day, then one pill daily for total of 5 days Continue to take torsemide for any fluid build up  Please call us if you have new issues that need to be addressed before your next appt.  Your physician wants you to follow-up in: 6 months.  You will receive a reminder letter in the mail two months in advance. If you don't receive a letter, please call our office to schedule the follow-up appointment.

## 2014-06-17 NOTE — Progress Notes (Signed)
Patient ID: Tina Pacheco, female    DOB: 13-Aug-1950, 64 y.o.   MRN: [id number]  HPI Comments: Tina Pacheco is a 64 year old woman with morbid obesity, who was admitted to [facility] on May 23 2010 for paroxysmal atrial fibrillation, echocardiogram showing normal LV function with diastolic dysfunction, also with hyperlipidemia, hypertension, cardiac catheterization May 24 2010 showing mild nonobstructive coronary artery disease, history of asthma/COPD, suspected sleep apnea, rheumatoid arthritis who presents for routine followup of her atrial fibrillation.  episodes of atrial fibrillation were March and July 2012, October 2013. episode of atrial fibrillation 12/16/2011. She was started on diltiazem infusion and converted to normal sinus rhythm several hours later.   In follow-up today, she denies any tachycardia concerning for atrial fibrillation. She has had a cough for the past 3 weeks, sometimes rattling, sometimes dry but it has persisted daytime and nighttime. She is taking allergy medications reports that her cough is not from allergies. Sometimes symptoms like a rattle, very wet She continues to have chronic shortness of breath. In the past, she reported having significant fatigue,  not sleeping well, only several hours per night. Weight continues to be a major issue, appears stable at 320 Not doing any exercise, very deconditioned. Reports that her fibromyalgia and rheumatoid arthritis are slowing her down. Continued significant arthritis in her knees, legs. She has not been taking much torsemide, only as needed. Possibly 5 times in the past few weeks   EKG on today's visit shows normal sinus rhythm with rate 74 bpm, no significant ST or T-wave changes   Other past medical history Family previously indicated that they thought that she has obstructive sleep apnea. She does not think that she can sleep with a CPAP. All of her atrial fibrillation episodes have occurred first  thing in the morning prior to medications.   lab work showing total cholesterol 114, LDL 55    Allergies  Allergen Reactions  . Penicillins     Childhood allergies. But has tolerated amoxicillin and other cillins since     Outpatient Encounter Prescriptions as of 06/17/2014  Medication Sig  . albuterol (PROVENTIL HFA;VENTOLIN HFA) 108 (90 BASE) MCG/ACT inhaler Inhale 2 puffs into the lungs every 6 (six) hours as needed. For shortness of breath  . atorvastatin (LIPITOR) 20 MG tablet Take 1 tablet (20 mg total) by mouth daily.  . budesonide-formoterol (SYMBICORT) 160-4.5 MCG/ACT inhaler Inhale 2 puffs into the lungs 2 (two) times daily as needed. For shortness of breath  . chlorpheniramine-HYDROcodone (TUSSIONEX) 10-8 MG/5ML LQCR take 10 milliliters by mouth every 12 hours if needed  . dabigatran (PRADAXA) 150 MG CAPS capsule Take 1 capsule (150 mg total) by mouth 2 (two) times daily.  . Febuxostat (ULORIC PO) Take one by mouth daily, pt unsure of strength  . fluticasone (FLONASE) 50 MCG/ACT nasal spray Place 2 sprays into the nose daily as needed. For allergies   . leflunomide (ARAVA) 20 MG tablet Take 20 mg by mouth daily.  Marland Kitchen lisinopril (PRINIVIL,ZESTRIL) 10 MG tablet take 1 tablet by mouth once daily  . metoprolol succinate (TOPROL-XL) 50 MG 24 hr tablet take 1 tablet by mouth once daily  . Multiple Vitamin (MULTIVITAMIN) tablet Take 1 tablet by mouth daily.    Marland Kitchen nystatin (MYCOSTATIN/NYSTOP) 100000 UNIT/GM POWD Apply to area three times daily as needed for rash  . oxyCODONE (ROXICODONE) 5 MG immediate release tablet 2 tablets every 12 hours  . potassium chloride SA (K-DUR,KLOR-CON) 20 MEQ tablet Take 0.5 tablets (10  mEq total) by mouth 2 (two) times daily. May take extra tab when taking torsemide  . pregabalin (LYRICA) 300 MG capsule Take 1 capsule (300 mg total) by mouth daily.  Marland Kitchen Specialty Vitamins Products (MAGNESIUM, AMINO ACID CHELATE,) 133 MG tablet Take 1 tablet by mouth at  bedtime.   . SUMAtriptan (IMITREX) 25 MG tablet Take 1 tablet (25 mg total) by mouth every 2 (two) hours as needed for migraine or headache. May repeat in 2 hours if headache persists or recurs.  Marland Kitchen terazosin (HYTRIN) 10 MG capsule take 1 capsule by mouth at bedtime  . torsemide (DEMADEX) 20 MG tablet Take 1 tablet (20 mg total) by mouth daily as needed. Edema  . azithromycin (ZITHROMAX) 250 MG tablet Take 2 pills on day one, Then one pill daily  . [DISCONTINUED] chlorpheniramine-HYDROcodone ([place]raham) 10-8 MG/5ML LQCR take 10 milliliters by mouth every 12 hours if needed (Patient not taking: Reported on 06/17/2014)    Past Medical History  Diagnosis Date  . Hypertension   . Dyslipidemia   . Arrhythmia     paroxysmal atrial fibrillation  . CHF (congestive heart failure)     chronic diastolic  . Morbid obesity   . Hypokalemia   . Coronary artery disease     nonobstructive CAD  . Asthma   . Rheumatoid arthritis(714.0)   . Basal cell carcinoma   . Hyperplastic colonic polyp 2003  . History of gout   . COPD (chronic obstructive pulmonary disease)   . Atrial fibrillation     Past Surgical History  Procedure Laterality Date  . Cardiac catheterization  05/24/2010    nonobstructive CAD  . Knee arthroscopy      bilateral  . Vaginal hysterectomy    . Cholecystectomy    . Cesarean section    . Colonoscopy  06/12/2011    Procedure: COLONOSCOPY;  Surgeon: Juanita Craver, MD;  Location: [facility];  Service: Endoscopy;  Laterality: N/A;  . Colonoscopy N/A 03/17/2013    Procedure: COLONOSCOPY;  Surgeon: Juanita Craver, MD;  Location: [facility];  Service: Endoscopy;  Laterality: N/A;    Social History  reports that she has never smoked. She has never used smokeless tobacco. She reports that she drinks alcohol. She reports that she does not use illicit drugs.  Family History family history includes Breast cancer in her maternal aunt and sister; Diabetes type II in her sister; Parkinsonism  in her mother; Tuberculosis in her mother.  Review of Systems  Constitutional: Positive for fatigue.  Respiratory: Positive for cough.   Cardiovascular: Negative.   Gastrointestinal: Negative.   Musculoskeletal: Positive for arthralgias and gait problem.  Neurological: Positive for weakness.  Hematological: Negative.   Psychiatric/Behavioral: Negative.   All other systems reviewed and are negative.   BP 120/66 mmHg  Pulse 74  Ht 5\' 4"  (1.626 m)  Wt 320 lb 8 oz (145.378 kg)  BMI 54.99 kg/m2  Physical Exam  Constitutional: She is oriented to person, place, and time. She appears well-developed and well-nourished.  HENT:  Head: Normocephalic.  Nose: Nose normal.  Mouth/Throat: Oropharynx is clear and moist.  Eyes: Conjunctivae are normal. Pupils are equal, round, and reactive to light.  Neck: Normal range of motion. Neck supple. No JVD present.  Cardiovascular: Normal rate, regular rhythm, S1 normal, S2 normal, normal heart sounds and intact distal pulses.  Exam reveals no gallop and no friction rub.   No murmur heard. Pulmonary/Chest: Effort normal and breath sounds normal. No respiratory distress. She has no  wheezes. She has no rales. She exhibits no tenderness.  Abdominal: Soft. Bowel sounds are normal. She exhibits no distension. There is no tenderness.  Musculoskeletal: Normal range of motion. She exhibits no edema or tenderness.  Lymphadenopathy:    She has no cervical adenopathy.  Neurological: She is alert and oriented to person, place, and time. Coordination normal.  Skin: Skin is warm and dry. No rash noted. No erythema.  Psychiatric: She has a normal mood and affect. Her behavior is normal. Judgment and thought content normal.    Assessment and Plan  Nursing note and vitals reviewed.

## 2014-06-28 NOTE — Consult Note (Signed)
Brief Consult Note: Diagnosis: Partial SBO, COPD, High Blood Pressure (Hypertension), Rheumatoid arthritis, Chronic afib, Fibromyalgia.   Patient was seen by consultant.   Consult note dictated.   Orders entered.   Comments: 63y/oF with PMH of High Blood Pressure (Hypertension), chronic afib on pradaxa, Rheumatoid Arthritis (714.0), OA, Fibromyalgia and COPD admitted for N/V and diarrhea for 2 days CT with partial SBO Medical consult requested for multiple medical problems  * Partial SBO- triggered by viral gastroeneteritis, management per surgical team NG tube placed, NPO  * UTI- started rocephin  * Chronic Afib- rate controleld, continue metoprolol pradaxa seems to be held- in case needs surgery? monitor for now  * Rheumatoid Arthritis (714.0) and OA- continue leflunomide and pain meds  * COPD- stable, continue inahlers  * DVT prophylaxis.  Electronic Signatures: Gladstone Lighter (MD)  (Signed 30-Jan-16 17:25)  Authored: Brief Consult Note   Last Updated: 30-Jan-16 17:25 by Gladstone Lighter (MD)

## 2014-06-28 NOTE — H&P (Signed)
Subjective/Chief Complaint nausea, vomiting , diarrhea   History of Present Illness started thursday denies pain, but has "discomfort". has considerable back pain no flatus today, multpile stools, diarrhea no melena no prior episode multiple "dry heaves"   Past History PMH RA, HTN, asthma, Afib PSH hyst, ooph, C section   Past Medical Health Hypertension   ALLERGIES:  Codeine: GI Distress  HOME MEDICATIONS: Medication Instructions Status  Ventolin HFA CFC free 90 mcg/inh inhalation aerosol 2 puff(s) inhaled every 6 hours as needed for shortness of breath. Active  atorvastatin 20 mg oral tablet 1 tab(s) orally once a day Active  budesonide-formoterol 160 mcg-4.5 mcg/inh inhalation aerosol 2 puff(s) inhaled 2 times a day as needed for shortness of breath. Active  dabigatran 150 mg oral capsule 1 cap(s) orally 2 times a day Active  febuxostat 1 tab(s) orally once a day Active  fluticasone nasal 50 mcg/inh nasal spray 2 spray(s) into each nostril once a day as needed for allergies. Active  Tussionex [employer] 8 mg-10 mg/5 mL oral suspension, extended release 10 milliliter(s) orally every 12 hours as needed. Active  leflunomide 20 mg oral tablet 1 tab(s) orally once a day Active  lisinopril 10 mg oral tablet 1 tab(s) orally once a day Active  Metoprolol Succinate ER 50 mg oral tablet, extended release 1 tab(s) orally once a day Active  nystatin topical 100000 units/g topical powder Apply topically to affected area 3 times a day as needed for rash. Active  oxyCODONE 5 mg oral tablet 2 tabs (48m) orally every 12 hours. Active  potassium chloride 20 mEq oral tablet, extended release 0.5 tab (125m) orally 2 times a day. *may take extra tab when taking torsemide* Active  pregabalin 300 mg oral capsule 1 cap(s) orally once a day Active  terazosin 10 mg oral capsule 1 cap(s) orally once a day (at bedtime) Active  torsemide 20 mg oral tablet 1 tab(s) orally once a day as needed for edema.  Active  zolpidem 10 mg oral tablet 1 tab(s) orally once a day (at bedtime) Active  magnesium amino acids chelate 133 mg oral tablet 1 tab(s) orally once a day (at bedtime) Active  multivitamin 1 tab(s) orally once a day Active   Family and Social History:  Family History Non-Contributory   Social History negative tobacco, retired   [facility]   Review of Systems:  Fever/Chills No   Cough No   Abdominal Pain No   Diarrhea Yes   Constipation No   Nausea/Vomiting Yes   SOB/DOE No   Chest Pain No   Dysuria No   Tolerating Diet No  Nauseated  Vomiting   Medications/Allergies Reviewed Medications/Allergies reviewed   Physical Exam:  GEN obese, uncomfortable   HEENT pink conjunctivae   NECK supple   RESP normal resp effort  clear BS  no use of accessory muscles   CARD irregular rate   ABD denies tenderness  no hernia  soft, min distension, not tympanitic, nontender   LYMPH negative neck   EXTR positive edema   SKIN normal to palpation   PSYCH alert, A+O to time, place, person, anxious   Lab Results: Hepatic:  30-Jan-16 10:32   Bilirubin, Total 0.9  Alkaline Phosphatase 92  SGPT (ALT) 18  SGOT (AST) 35  Total Protein, Serum 6.8  Albumin, Serum  3.1  Routine Chem:  30-Jan-16 10:32   Lipase  48 (Result(s) reported on 28 Mar 2014 at 10:57AM.)  Glucose, Serum  109  BUN  14  Creatinine (comp) 1.06  Sodium, Serum 140  Potassium, Serum 4.0  Chloride, Serum  109  CO2, Serum 24  Calcium (Total), Serum 9.8  Osmolality (calc) 280  eGFR (African American) >60  eGFR (Non-African American)  55 (eGFR values <36m/min/1.73 m2 may be an indication of chronic kidney disease (CKD). Calculated eGFR, using the MRDR Study equation, is useful in  patients with stable renal function. The eGFR calculation will not be reliable in acutely ill patients when serum creatinine is changing rapidly. It is not useful in patients on dialysis. The eGFR calculation  may not be applicable to patients at the low and high extremes of body sizes, pregnant women, and vegetarians.)  Anion Gap 7  Cardiac:  30-Jan-16 10:32   Troponin I 0.04 (0.00-0.05 0.05 ng/mL or less: NEGATIVE  Repeat testing in 3-6 hrs  if clinically indicated. >0.05 ng/mL: POTENTIAL  MYOCARDIAL INJURY. Repeat  testing in 3-6 hrs if  clinically indicated. NOTE: An increase or decrease  of 30% or more on serial  testing suggests a  clinically important change)  Routine UA:  30-Jan-16 10:33   Color (UA) Yellow  Clarity (UA) Cloudy  Glucose (UA) Negative  Bilirubin (UA) Negative  Ketones (UA) Negative  Specific Gravity (UA) 1.018  Blood (UA) Negative  pH (UA) 5.0  Protein (UA) 30 mg/dL  Nitrite (UA) Negative  Leukocyte Esterase (UA) 3+ (Result(s) reported on 28 Mar 2014 at 11:21AM.)  RBC (UA) 3 /HPF  WBC (UA) 72 /HPF  Bacteria (UA) NONE SEEN  Epithelial Cells (UA) 17 /HPF  Mucous (UA) PRESENT  Hyaline Cast (UA) 3 /LPF (Result(s) reported on 28 Mar 2014 at 11:21AM.)  Routine Coag:  30-Jan-16 10:33   Prothrombin 13.9 (11.4-15.0 NOTE: New Reference Range  03/27/14)  INR 1.1 (INR reference interval applies to patients on anticoagulant therapy. A single INR therapeutic range for coumarins is not optimal for all indications; however, the suggested range for most indications is 2.0 - 3.0. Exceptions to the INR Reference Range may include: Prosthetic heart valves, acute myocardial infarction, prevention of myocardial infarction, and combinations of aspirin and anticoagulant. The need for a higher or lower target INR must be assessed individually. Reference: The Pharmacology and Management of the Vitamin K  antagonists: the seventh ACCP Conference on Antithrombotic and Thrombolytic Therapy. CIRCVE.9381Sept:126 (3suppl): [id number] A HCT value >55% may artifactually increase the PT.  In one study,  the increase was an average of 25%. Reference:  "Effect on Routine and  Special Coagulation Testing Values of Citrate Anticoagulant Adjustment in Patients with High HCT Values." American Journal of Clinical Pathology 2006;126:400-405.)  Activated PTT (APTT)  < 23.0 (A HCT value >55% may artifactually increase the APTT. In one study, the increase was an average of 19%. Reference: "Effect on Routine and Special Coagulation Testing Values of Citrate Anticoagulant Adjustment in Patients with High HCT Values." American Journal of Clinical Pathology 2006;126:400-405.)  Routine Hem:  30-Jan-16 10:32   WBC (CBC) 7.5  RBC (CBC) 4.33  Hemoglobin (CBC)  11.8  Hematocrit (CBC) 36.6  Platelet Count (CBC) 191 (Result(s) reported on 28 Mar 2014 at 10:49AM.)  MCV 85  MCH 27.3  MCHC 32.4  RDW  14.9   Radiology Results: XRay:    30-Jan-16 10:13, Abdomen Flat and Erect  Abdomen Flat and Erect  REASON FOR EXAM:    vomiting and diarrhea for 3 days  COMMENTS:       PROCEDURE: DXR - DXR ABDOMEN 2 V FLAT AND ERECT  -  Mar 28 2014 10:13AM     CLINICAL DATA:  64 year old female with nausea, vomiting and  diarrhea for 3 days.    EXAM:  ABDOMEN - 2 VIEW    COMPARISON:  11/28/2011.    FINDINGS:  Surgical clips project over the right upper quadrant of the abdomen,  presumably from prior cholecystectomy. Gas and stool are noted  throughout the colon. Paucity of distal rectal gas (nonspecific). No  dilatation of the colon. No pathologic dilatation of small bowel.  There are several loops of bowel in the central abdomen which may be  small bowel loops. These are dilated up to approximately 4.6 cm in  diameter. No air-fluid levels are noted. No pneumoperitoneum. No  gastric dilatation.     IMPRESSION:  Nonspecific but abnormal bowel gas pattern with potential loops of  small bowel dilatation of the central abdomen. The possibility of an  early or partial small bowel obstruction is not excluded, and  further evaluation with contrast-enhanced CT of the abdomen and  pelvis  is recommended at this time.  Electronically Signed    By: Vinnie Langton M.D.    On: 03/28/2014 10:17         Verified By: Etheleen Mayhew, M.D.,  CT:    30-Jan-16 14:27, CT Abdomen and Pelvis With Contrast  CT Abdomen and Pelvis With Contrast  REASON FOR EXAM:    (1) vomiting, diarrhea; (2) vomiting, diarrhea;      NOTE: Nursing to Give Oral CT  COMMENTS:   May transport without cardiac monitor    PROCEDURE: CT  - CT ABDOMEN / PELVIS  W  - Mar 28 2014  2:27PM     CLINICAL DATA:  Three-day history abdominal pain with nausea,  vomiting, and diarrhea    EXAM:  CT ABDOMEN AND PELVIS WITH CONTRAST    TECHNIQUE:  Multidetector CT imaging of the abdomen and pelvis was performed  using the standard protocol following bolus administration of  intravenous contrast. Oral contrast was also administered.    CONTRAST:  125 mL Omnipaque 300 nonionic    COMPARISON:  None.    FINDINGS:  There is patchy bibasilar atelectasis. There is lower lobe  bronchiectatic change bilaterally. There is extensivecalcification  in the mitral annulus.    There is a 1.5 x 1.0 cm apparent cyst in the right lobe near the  dome. Gallbladder is absent. There is no appreciable biliary duct  dilatation. There is an adjacent 4 mm probable cyst in this area. No  other focal liver lesions are identified. Gallbladder is absent.  There is no biliary duct dilatation.    Spleen, pancreas, and adrenals appear normal. Kidneys bilaterally  show no mass or hydronephrosis on either side. There is no renal or  ureteral calculus on either side.    In the pelvis, the urinary bladder is midline with normal wall  thickness. There is no pelvic mass or pelvic fluid collection.  Appendix appears within normal limits.    There is proximal bowel dilatation with a transition zone in the  right mid abdomen in the mid jejunal region or. Oral contrast does  pass through this area. There is no free air or portal venous  air.    There is no appreciable ascites, adenopathy, or abscess in the  abdomen or pelvis. There is atherosclerotic change in the aorta but  no aneurysm. There is degenerative change in the lumbar spine. There  are no blastic or lytic bone lesions.  IMPRESSION:  There is a degree of partial small bowel obstruction at the level of  the mid jejunum. There is a transition zone in this area. Oral  contrast does pass through this area indicating that there is no  complete obstruction. The surrounding mesenteric does not appear  thickened. No abscess. Appendix region appears normal.    No renal or ureteral calculus.  No hydronephrosis.      Electronically Signed    By: Lowella Grip M.D.    On: 03/28/2014 14:36         Verified By: Leafy Kindle. WOODRUFF, M.D.,    Assessment/Admission Diagnosis pSBO vs gastroenteritis stool sample pending NG, hydrate, repeat films will ask PD to assist with mult meds   Electronic Signatures: Florene Glen (MD)  (Signed 30-Jan-16 16:45)  Authored: CHIEF COMPLAINT and HISTORY, ALLERGIES, HOME MEDICATIONS, FAMILY AND SOCIAL HISTORY, REVIEW OF SYSTEMS, PHYSICAL EXAM, LABS, Radiology, ASSESSMENT AND PLAN   Last Updated: 30-Jan-16 16:45 by Florene Glen (MD)

## 2014-06-28 NOTE — Consult Note (Signed)
PATIENT NAME:  Tina Pacheco, Tina Pacheco MR#:  [id number] DATE OF BIRTH:  [birth year]  DATE OF CONSULTATION:  03/28/2014  REFERRING PHYSICIAN:   CONSULTING PHYSICIAN:  Gladstone Lighter, MD  ADMITTING PHYSICIAN: Phoebe Perch, MD   PRIMARY CARE PHYSICIAN: Marciano Sequin. Deborra Medina, MD   REASON FOR CONSULTATION: Management of medical problems.   BRIEF HISTORY: Ms. Zaborowski is a 64 year old obese Caucasian female with multiple past medical problems including hypertension, chronic atrial fibrillation on Pradaxa, osteoarthritis, rheumatoid arthritis, hyperlipidemia, fibromyalgia, who presented to the hospital secondary to worsening nausea, vomiting, and diarrhea for 3 days now. The patient states that she has not eaten anything unusual outside. No recent travel. Started about 3 days ago, initially just an abdominal discomfort and nausea or vomiting, multiple loose stools. Denies any bleeding. It is more retching now. Her abdominal pain started to get worse, so presented to the hospital. She had a CT of the abdomen showing possible partial small bowel obstruction versus gastroenteritis, so she is being admitted to surgical service. Because of her multiple medical problems, medicine consult has been requested to manage medications. At this time, the patient appears to be distressed secondary to significant constant retching.   PAST MEDICAL HISTORY:  1.  Hypertension.  2.  Chronic atrial fibrillation on anticoagulation.  3.  Rheumatoid arthritis.  4.  Osteoarthritis.  5.  Hyperlipidemia.  6.  Fibromyalgia.  7.  Asthma/COPD.  PAST SURGICAL HISTORY:  1.  Hysterectomy with oophorectomy. 2.  C-section.  3.  Cholecystectomy.   ALLERGIES TO MEDICATIONS: INTOLERANT TO CODEINE, THAT CAUSES NAUSEA AND VOMITING.   CURRENT HOME MEDICATIONS:  1.  Atorvastatin 20 mg p.o. daily.  2.  Symbicort 160/4.5 mcg 2 puffs b.i.d.  3.  Pradaxa 150 mg p.o. b.i.d.  4.  Febuxostat 1 tablet daily.  5.  Flonase nasal spray 2 sprays each  nostril once a day.  6.  Leflunomide 20 mg p.o. daily.  7.  Lisinopril 10 mg p.o. daily.  8.  Magnesium amino acid tablet 1 tablet daily.  9.  Toprol 50 mg p.o. daily.  10.  Multivitamin 1 tablet p.o. daily.  11.  Nystatin topical powder 3 times a day as needed for rash.  12.  Oxycodone 10 mg p.o. b.i.d.  13.  Potassium chloride 10 mEq p.o. b.i.d.  14.  Pregabalin 300 mg p.o. daily.  15.  Terazosin 10 mg p.o. at bedtime.  16.  Torsemide 20 mg p.o. daily as needed for edema.  17.  Tussionex 10 mL q. 12 hours as needed for cough.  18.  Ventolin inhaler 2 puffs q. 6 hours p.r.n. for wheezing.  19.  Zolpidem 10 mg p.o. at bedtime for sleep.    SOCIAL HISTORY: Lives at home with her husband. Has a cane, but does not ambulate with a cane, but unsteady on her feet by herself. No smoking or alcohol use.   FAMILY HISTORY: Sister and maternal with breast cancer, mom with Parkinson disease, and dad had severe atherosclerotic heart disease.   REVIEW OF SYSTEMS:  CONSTITUTIONAL: No fever, fatigue, or weakness.  EYES: No blurred vision, double vision, inflammation, or glaucoma. Uses reading glasses.  EAR, NOSE, AND THROAT: No tinnitus, ear pain, hearing loss, epistaxis, or discharge.  RESPIRATORY: Positive for heavy breathing and occasionally and asthma. No hemoptysis or wheezing.  CARDIOVASCULAR: No chest pain, orthopnea, edema, arrhythmia, palpitations, or syncope.  GASTROINTESTINAL: Positive for nausea, vomiting, diarrhea, abdominal pain. No hematemesis or melena.  GENITOURINARY: No dysuria, hematuria, increased frequency of urination  present.  ENDOCRINE: No polyuria, nocturia, thyroid problems, heat or cold intolerance.  HEMATOLOGY: No anemia, easy bruising or bleeding.  SKIN: No acne, rash or lesions.  MUSCULOSKELETAL: Has arthritis and gout.  NEUROLOGICAL: No numbness, weakness, CVA, TIA, or seizures.  PSYCHOLOGICAL: No anxiety, insomnia, depression.   PHYSICAL EXAMINATION:  VITAL SIGNS:  Temperature afebrile, pulse 75, respirations 24, blood pressure 168/89, pulse oximetry 100% on room air.  GENERAL: Heavily built, well-nourished female sitting in bed, not in any acute distress.  HEENT: Normocephalic, atraumatic. Pupils equal, round, reacting to light. Anicteric sclerae. Extraocular movements intact. Oropharynx is clear without erythema, mass, or exudates.  NECK: Supple. No thyromegaly, JVD, or carotid bruits. No lymphadenopathy.  LUNGS: Moving air bilaterally. Decreased bibasilar breath sounds. No wheeze or crackles. No use of accessory muscles for breathing.  CARDIOVASCULAR: S1, S2, regular rate and rhythm. No murmurs, rubs, or gallops.  ABDOMEN: Obese. Some discomfort on palpation,  mild voluntary guarding. No hepatosplenomegaly. No rigidity or rebound tenderness.  EXTREMITIES: Trace pedal edema. No clubbing or cyanosis. Dorsalis pedis pulses palpable bilaterally.  SKIN: No acne, rash, or lesions.  LYMPHATICS: No cervical lymphadenopathy.  NEUROLOGIC: Cranial nerves intact. No focal motor or sensory deficits.  PSYCHOLOGICAL: The patient is awake, alert, oriented x 3.   LABORATORY DATA:  1.  WBC 7.5, hemoglobin 11.8, hematocrit 36.6, platelet count 191,000.  2.  Sodium 140, potassium 4.0, chloride 109, bicarbonate 24, BUN 14, creatinine 1.06, glucose 109, and calcium of 9.8.  3.  ALT 18, AST 35, alkaline phosphatase 92, total bilirubin 0.9, albumin of 3.1. Troponin is 0.04.  4.  Abdominal x-ray showing abnormal bowel gas. Possibility of early or partial small bowel obstruction noted.  5.  Urinalysis with 3+ leukocyte esterase, no bacteria, a few WBCs seen.  6.  INR is 1.1.  7.  CT of the abdomen and pelvis with contrast showing degree of partial small bowel obstruction lower left mid jejunum, transition zone is present. Oral contrast did pass through, as there is no complete obstruction. No abscess noted.   ASSESSMENT AND RECOMMENDATIONS: A 64 year old female with multiple  medical problems including hypertension, chronic atrial fibrillation on Pradaxa, rheumatoid arthritis, osteoarthritis, fibromyalgia, and chronic obstructive pulmonary disease, admitted for nausea, vomiting, and diarrhea for 2 days. CT abdomen showing partial small bowel obstruction. Medical consult requested for multiple medical problems management.  1.  Partial small bowel obstruction: Could be triggered by gastroenteritis. Management per surgical team, not on any antibiotics at this time. NG tube ordered and the patient is kept n.p.o.  2.  Urinary tract infection: Started Rocephin. Could be contamination; however, does complain of increased frequency of urination.  3.  Chronic atrial fibrillation, rate controlled: Continue metoprolol. Pradaxa seems to be held, maybe in case she needs surgery; restart at the earliest time if no plans for surgery. Monitor for now.  4.  Rheumatoid and osteoarthritis: Continue home pain medication. She is also on leflunomide, which is ordered.  5.  Chronic obstructive pulmonary disease/asthma, appears stable: Continue home inhalers.  6.  Deep vein thrombosis prophylaxis.  CODE STATUS: Full code.   TIME SPENT CONSULTATION: 50 minutes.    ____________________________ Gladstone Lighter, MD rk:bm D: 03/28/2014 17:43:54 ET T: 03/29/2014 00:41:28 ET JOB#: [id number]  cc: Gladstone Lighter, MD, <Dictator> Marciano Sequin. Deborra Medina, MD Gladstone Lighter MD ELECTRONICALLY SIGNED 03/29/2014 10:57

## 2014-06-29 ENCOUNTER — Encounter: Payer: Self-pay | Admitting: Family Medicine

## 2014-06-29 ENCOUNTER — Ambulatory Visit (INDEPENDENT_AMBULATORY_CARE_PROVIDER_SITE_OTHER): Payer: Federal, State, Local not specified - PPO | Admitting: Family Medicine

## 2014-06-29 VITALS — BP 134/82 | HR 74 | Temp 97.8°F | Wt 328.0 lb

## 2014-06-29 DIAGNOSIS — R05 Cough: Secondary | ICD-10-CM | POA: Diagnosis not present

## 2014-06-29 DIAGNOSIS — R053 Chronic cough: Secondary | ICD-10-CM

## 2014-06-29 DIAGNOSIS — J309 Allergic rhinitis, unspecified: Secondary | ICD-10-CM

## 2014-06-29 DIAGNOSIS — J45909 Unspecified asthma, uncomplicated: Secondary | ICD-10-CM | POA: Diagnosis not present

## 2014-06-29 MED ORDER — MONTELUKAST SODIUM 10 MG PO TABS: 10.0000 mg | ORAL_TABLET | Freq: Every day | ORAL | Status: DC

## 2014-06-29 NOTE — Progress Notes (Signed)
Patient ID: Tina Pacheco, female    DOB: [birth year], 64 y.o.   MRN: [id number]  HPI Comments: Tina Pacheco is a 64 year old with complicated medical history, including  paroxysmal atrial fibrillation here for chronic cough deterioration.  Chronic cough- has had chronic cough for years.  Previous episodes of tickle in her throat that leads to terrible cough, often has post tussive emesis.    5 weeks ago, develop a different kind of cough.  She feels there is a lot of mucous in her throat but cannot get it up.  Dr. Rockey Pacheco given her a zpack on 06/17/14.  She is not sure if this helped.   H/o allergic rhinitis/asthma- currently taking flonase,zyrtec, and symbicort.     CXRs have been neg previously.  Tussionex usually helps but not as helpful with this cough.     Outpatient Encounter Prescriptions as of 06/29/2014  . Order #: [id number] Class: Print  . Order #: [id number] Class: Normal  . Order #: [id number] Class: Print  . Order #: [id number] Class: Print  . Order #: [id number] Class: Normal  . Order #: [id number] Class: Historical Med  . Order #: [id number] Class: Historical Med  . Order #: [id number] Class: Historical Med  . Order #: [id number] Class: Normal  . Order #: [id number] Class: Normal  . Order #: [id number] Class: Historical Med  . Order #: [id number] Class: Normal  . Order #: [id number] Class: Print  . Order #: [id number] Class: Normal  . Order #: [id number] Class: No Print  . Order #: [id number] Class: Historical Med  . Order #: [id number] Class: Print  . Order #: [id number] Class: Normal  . Order #: [id number] Class: Normal  . [DISCONTINUED] Order #: [id number] Class: Normal   Current Outpatient Prescriptions on File Prior to Visit  Medication Sig Dispense Refill  . albuterol (PROVENTIL HFA;VENTOLIN HFA) 108 (90 BASE) MCG/ACT inhaler Inhale 2 puffs into the lungs every 6 (six) hours as needed. For shortness of breath 3 Inhaler 1  . atorvastatin (LIPITOR) 20 MG tablet Take 1 tablet (20 mg  total) by mouth daily. 90 tablet 3  . budesonide-formoterol (SYMBICORT) 160-4.5 MCG/ACT inhaler Inhale 2 puffs into the lungs 2 (two) times daily as needed. For shortness of breath 3 Inhaler 1  . chlorpheniramine-HYDROcodone (TUSSIONEX) 10-8 MG/5ML LQCR take 10 milliliters by mouth every 12 hours if needed 480 mL 0  . dabigatran (PRADAXA) 150 MG CAPS capsule Take 1 capsule (150 mg total) by mouth 2 (two) times daily. 180 capsule 3  . Febuxostat (ULORIC PO) Take one by mouth daily, pt unsure of strength    . fluticasone (FLONASE) 50 MCG/ACT nasal spray Place 2 sprays into the nose daily as needed. For allergies     . leflunomide (ARAVA) 20 MG tablet Take 20 mg by mouth daily.    Marland Kitchen lisinopril (PRINIVIL,ZESTRIL) 10 MG tablet take 1 tablet by mouth once daily 90 tablet 1  . metoprolol succinate (TOPROL-XL) 50 MG 24 hr tablet take 1 tablet by mouth once daily 30 tablet 6  . Multiple Vitamin (MULTIVITAMIN) tablet Take 1 tablet by mouth daily.      Marland Kitchen nystatin (MYCOSTATIN/NYSTOP) 100000 UNIT/GM POWD Apply to area three times daily as needed for rash 60 g 0  . oxyCODONE (ROXICODONE) 5 MG immediate release tablet 2 tablets every 12 hours 270 tablet 0  . potassium chloride SA (K-DUR,KLOR-CON) 20 MEQ tablet Take 0.5 tablets (10 mEq total) by mouth 2 (two) times daily. May take  extra tab when taking torsemide 180 tablet 3  . pregabalin (LYRICA) 300 MG capsule Take 1 capsule (300 mg total) by mouth daily.    Marland Kitchen Specialty Vitamins Products (MAGNESIUM, AMINO ACID CHELATE,) 133 MG tablet Take 1 tablet by mouth at bedtime.     . SUMAtriptan (IMITREX) 25 MG tablet Take 1 tablet (25 mg total) by mouth every 2 (two) hours as needed for migraine or headache. May repeat in 2 hours if headache persists or recurs. 10 tablet 0  . terazosin (HYTRIN) 10 MG capsule take 1 capsule by mouth at bedtime 90 capsule 0  . torsemide (DEMADEX) 20 MG tablet Take 1 tablet (20 mg total) by mouth daily as needed. Edema 30 tablet 0   No  current facility-administered medications on file prior to visit.    Allergies  Allergen Reactions  . Penicillins     Childhood allergies. But has tolerated amoxicillin and other cillins since     Past Medical History  Diagnosis Date  . Hypertension   . Dyslipidemia   . Arrhythmia     paroxysmal atrial fibrillation  . CHF (congestive heart failure)     chronic diastolic  . Morbid obesity   . Hypokalemia   . Coronary artery disease     nonobstructive CAD  . Asthma   . Rheumatoid arthritis(714.0)   . Basal cell carcinoma   . Hyperplastic colonic polyp 2003  . History of gout   . COPD (chronic obstructive pulmonary disease)   . Atrial fibrillation     Past Surgical History  Procedure Laterality Date  . Cardiac catheterization  05/24/2010    nonobstructive CAD  . Knee arthroscopy      bilateral  . Vaginal hysterectomy    . Cholecystectomy    . Cesarean section    . Colonoscopy  06/12/2011    Procedure: COLONOSCOPY;  Surgeon: Juanita Craver, MD;  Location: [facility];  Service: Endoscopy;  Laterality: N/A;  . Colonoscopy N/A 03/17/2013    Procedure: COLONOSCOPY;  Surgeon: Juanita Craver, MD;  Location: [facility];  Service: Endoscopy;  Laterality: N/A;    Family History  Problem Relation Age of Onset  . Tuberculosis Mother   . Parkinsonism Mother   . Diabetes type II Sister   . Breast cancer Sister   . Breast cancer Maternal Aunt     History   Social History  . Marital Status: Married    Spouse Name: N/A  . Number of Children: N/A  . Years of Education: N/A   Occupational History  . Not on file.   Social History Main Topics  . Smoking status: Never Smoker   . Smokeless tobacco: Never Used  . Alcohol Use: Yes     Comment: occasional-red wine  . Drug Use: No  . Sexual Activity: Not on file   Other Topics Concern  . Not on file   Social History Narrative   The PMH, PSH, Social History, Family History, Medications, and allergies have been reviewed in  [facility], and have been updated if relevant.   Review of Systems  Review of Systems  Constitutional: Negative.   HENT: Positive for rhinorrhea.   Respiratory: Positive for cough. Negative for shortness of breath, wheezing and stridor.   Cardiovascular: Negative.   Gastrointestinal: Negative.   Musculoskeletal: Negative.   Skin: Negative.   Neurological: Negative.   All other systems reviewed and are negative.     Physical Exam  BP 134/82 mmHg  Pulse 74  Temp(Src) 97.8 F (  36.6 C) (Oral)  Wt 328 lb (148.78 kg)  SpO2 95%  Constitutional: She is oriented to person, place, and time. She appears well-developed and well-nourished.       obese  HENT:  Head: Normocephalic.  Nose: Nose normal.  Eyes: Conjunctivae are normal. Pupils are equal, round, and reactive to light, no nyastagmus.  Neck: Normal range of motion. Neck supple. No JVD present.  Cardiovascular: Normal rate, regular rhythm, S1 normal, S2 normal, normal heart sounds and intact distal pulses.  Exam reveals no gallop and no friction rub.   No murmur heard. Pulmonary/Chest: Effort normal and breath sounds normal. No respiratory distress. She has no wheezes. She has no rales. She exhibits no tenderness.  Abdominal: Soft. Bowel sounds are normal. She exhibits no distension. There is no tenderness.  Musculoskeletal: Normal range of motion. She exhibits no edema and no tenderness.  Lymphadenopathy:    She has no cervical adenopathy.  Neurological: She is alert and oriented to person, place, and time. Coordination normal.  Skin: Skin is warm and dry. No rash noted. No erythema.  Psychiatric: She has a normal mood and affect. Her behavior is normal. Judgment and thought content normal.

## 2014-06-29 NOTE — Patient Instructions (Signed)
Good to see you. We will call you with a referral to see an allergist or you can stop by to see Tina Pacheco on your way out today to set this up.  Please restart Singulair and given Mucinex a try (plase drink plenty of water while taking this).

## 2014-06-29 NOTE — Progress Notes (Signed)
Pre visit review using our clinic review tool, if applicable. No additional management support is needed unless otherwise documented below in the visit note. 

## 2014-06-29 NOTE — Assessment & Plan Note (Signed)
Deteriorated but this cough is different than her typical "tickle." This seems more consistent with allergic RAD/rhinitis. Refer to allergist.  Advised to restart singulair (eRx sent) and mucinex. See AVS. Call or return to clinic prn if these symptoms worsen or fail to improve as anticipated. The patient indicates understanding of these issues and agrees with the plan.

## 2014-06-30 ENCOUNTER — Telehealth: Payer: Self-pay | Admitting: *Deleted

## 2014-06-30 NOTE — Telephone Encounter (Signed)
Patient is currently taking Pradaxa. Will forward to Dr. Rockey Situ to review.

## 2014-06-30 NOTE — Telephone Encounter (Signed)
Pt went to pediatrist dr trolxer is putting pt on celebrex  needs to know if this is okay to take Please advise.

## 2014-07-02 NOTE — Telephone Encounter (Signed)
What is dose and how long is she going to stay on the celebrex. Need details There is a slightly higher risk,  But benefit to her may be worth risk

## 2014-07-03 NOTE — Telephone Encounter (Signed)
Spoke w/ pt.  Advised her of Dr. Gollan's recommendation. She verbalizes understanding and is appreciative of the call.  

## 2014-07-03 NOTE — Telephone Encounter (Signed)
Spoke w/ pt.  She reports that she has been having a lot of problems w/ her feet. She was prescribed 200 mg QD, but is unsure of how long she will be on this.  Reports she was on this long term previously w/ good results.  She would like to try it again to see if it will help w/ her pain and if so, how long Dr. Rockey Situ recommends she stay on it if needed.

## 2014-07-03 NOTE — Telephone Encounter (Signed)
200 mg Celebrex is relatively low dose Would start if the benefit is good

## 2014-07-06 ENCOUNTER — Telehealth: Payer: Self-pay

## 2014-07-06 ENCOUNTER — Encounter: Payer: Self-pay | Admitting: Family Medicine

## 2014-07-06 ENCOUNTER — Ambulatory Visit (INDEPENDENT_AMBULATORY_CARE_PROVIDER_SITE_OTHER): Payer: Federal, State, Local not specified - PPO | Admitting: Family Medicine

## 2014-07-06 VITALS — BP 136/78 | HR 77 | Temp 97.9°F | Wt 314.0 lb

## 2014-07-06 DIAGNOSIS — R197 Diarrhea, unspecified: Secondary | ICD-10-CM | POA: Diagnosis not present

## 2014-07-06 DIAGNOSIS — G4700 Insomnia, unspecified: Secondary | ICD-10-CM

## 2014-07-06 DIAGNOSIS — G2581 Restless legs syndrome: Secondary | ICD-10-CM

## 2014-07-06 DIAGNOSIS — M79609 Pain in unspecified limb: Secondary | ICD-10-CM | POA: Diagnosis not present

## 2014-07-06 DIAGNOSIS — R202 Paresthesia of skin: Secondary | ICD-10-CM

## 2014-07-06 LAB — CBC
HCT: 38 % (ref 36.0–46.0)
Hemoglobin: 12.5 g/dL (ref 12.0–15.0)
MCHC: 32.9 g/dL (ref 30.0–36.0)
MCV: 83.6 fl (ref 78.0–100.0)
PLATELETS: 218 10*3/uL (ref 150.0–400.0)
RBC: 4.55 Mil/uL (ref 3.87–5.11)
RDW: 15.8 % — ABNORMAL HIGH (ref 11.5–15.5)
WBC: 6.7 10*3/uL (ref 4.0–10.5)

## 2014-07-06 LAB — COMPREHENSIVE METABOLIC PANEL
ALBUMIN: 3.9 g/dL (ref 3.5–5.2)
ALK PHOS: 92 U/L (ref 39–117)
ALT: 21 U/L (ref 0–35)
AST: 27 U/L (ref 0–37)
BUN: 12 mg/dL (ref 6–23)
CO2: 27 mEq/L (ref 19–32)
Calcium: 11.1 mg/dL — ABNORMAL HIGH (ref 8.4–10.5)
Chloride: 106 mEq/L (ref 96–112)
Creatinine, Ser: 0.81 mg/dL (ref 0.40–1.20)
GFR: 75.56 mL/min (ref >60.00)
Glucose, Bld: 119 mg/dL — ABNORMAL HIGH (ref 70–99)
POTASSIUM: 4.3 meq/L (ref 3.5–5.1)
SODIUM: 138 meq/L (ref 135–145)
TOTAL PROTEIN: 7.1 g/dL (ref 6.0–8.3)
Total Bilirubin: 0.9 mg/dL (ref 0.2–1.2)

## 2014-07-06 LAB — TSH: TSH: 0.65 u[IU]/mL (ref 0.35–4.50)

## 2014-07-06 LAB — MAGNESIUM: Magnesium: 1.9 mg/dL (ref 1.5–2.5)

## 2014-07-06 MED ORDER — GABAPENTIN 300 MG PO CAPS: ORAL_CAPSULE | ORAL | Status: DC

## 2014-07-06 NOTE — Assessment & Plan Note (Signed)
New- >25 minutes spent in face to face time with patient, >50% spent in counselling or coordination of care concerning RLS, insomnia, diarrhea, and peripheral neuropathy. Unclear how all of these separate issues are related.  ? Gastroenteritis which is triggering RLS? She refuses EKG, telling me she is certain this is non cardiac. Check labs today to rule out some possible contributing factors. Start gabapentin for RLS and neuropathy.  The patient indicates understanding of these issues and agrees with the plan.  Orders Placed This Encounter  Procedures  . Magnesium  . CBC  . Comprehensive metabolic panel  . TSH

## 2014-07-06 NOTE — Progress Notes (Signed)
   Subjective:   Patient ID: Tina Pacheco, female    DOB: 12/23/1950, 64 y.o.   MRN: [id number]  Tina Pacheco is a pleasant 64 y.o. year old female who presents to clinic today with cold sweat; Diarrhea; and Leg Pain  on 07/06/2014  HPI:  Here for multiple complaints.  She feels her most pressing issue is that she cannot get comfortable- feels like she has restless leg but all day long.  Feels "antsy."  Has had this in past, went away on its own. No new rxs.  Slept 15 minute intervals past two night.  This am, slept the most she has slept in past two days- 30 minutes.  No CP or SOB.  Saw her cardiologist, Dr. Rockey Situ on 06/17/14.  Two days ago, developed watery diarrhea with mild nausea, no vomiting.  Has not eaten much since.  Has had cold chills, no fever.  No abdominal pain.  No black or blood stools.  Feels her neuropathy is worse.  Has been seeing podiatry for right arch pain Lab Results  Component Value Date   NA 139 05/26/2014   K 4.2 05/26/2014   CL 107 05/26/2014   CO2 29 05/26/2014   Lab Results  Component Value Date   TSH 2.46 05/26/2014     Review of Systems  Constitutional: Positive for fever. Negative for diaphoresis and fatigue.  HENT: Negative.   Respiratory: Negative.   Cardiovascular: Negative.   Gastrointestinal: Positive for nausea and diarrhea. Negative for vomiting, abdominal pain, constipation, blood in stool, abdominal distention, anal bleeding and rectal pain.  Endocrine: Negative.   Genitourinary: Negative.   Musculoskeletal: Positive for myalgias.  Allergic/Immunologic: Negative.   Neurological: Negative.   Hematological: Negative.   Psychiatric/Behavioral: Positive for agitation.       Objective:    BP 136/78 mmHg  Pulse 77  Temp(Src) 97.9 F (36.6 C) (Oral)  Wt 314 lb (142.429 kg)  SpO2 98%   Physical Exam  Constitutional: She is oriented to person, place, and time. She appears well-developed and well-nourished.  Very  restless, cannot seem to sit still in chair, legs constantly moving.  HENT:  Head: Normocephalic.  Eyes: Conjunctivae are normal.  Neck: Normal range of motion.  Cardiovascular: Normal rate.   Pulmonary/Chest: Effort normal.  Musculoskeletal: Normal range of motion.  Neurological: She is alert and oriented to person, place, and time. A cranial nerve deficit is present.  Skin: Skin is warm and dry.  Psychiatric: She has a normal mood and affect. Her behavior is normal. Judgment and thought content normal.  Nursing note and vitals reviewed.         Assessment & Plan:   Diarrhea  Insomnia  Paresthesia and pain of right extremity  RLS (restless legs syndrome) No Follow-up on file.

## 2014-07-06 NOTE — Telephone Encounter (Signed)
PLEASE NOTE: All timestamps contained within this report are represented as [place] Standard Time. CONFIDENTIALTY NOTICE: This fax transmission is intended only for the addressee. It contains information that is legally privileged, confidential or otherwise protected from use or disclosure. If you are not the intended recipient, you are strictly prohibited from reviewing, disclosing, copying using or disseminating any of this information or taking any action in reliance on or regarding this information. If you have received this fax in error, please notify us immediately by telephone so that we can arrange for its return to [place]. Phone: [phone], Toll-Free: [phone], Fax: [phone] Page: 1 of 2 Call Id: [id number] Honor Patient Name: Tina Pacheco Gender: Female DOB: 12-Jul-1950 Age: 64 Y 51 M 1 D Return Phone Number: [phone] (Primary) Address: City/State/Zip: [place] Client [place] Night - Client Client Site [place] Physician Arnette Norris Contact Type Call Call Type Triage / Clinical Relationship To Patient Self Return Phone Number [phone] (Primary) Chief Complaint Fever (non urgent symptom) (> THREE MONTHS) Initial Comment Caller states has fever and chills. The pt starts to warm up and begins to sweat. PreDisposition Home Care Nurse Assessment Nurse: Denyse Amass, RN, Benjamine Mola Date/Time [name]: 07/05/2014 9:06:20 PM Confirm and document reason for call. If symptomatic, describe symptoms. ---Patient states she feels hot then cold. States she can't get comfortable. Temp is 97.4 orally. Started having chills today. No fever. States she is sweating. Denies pain. States she can't sit still and can't get comfortable. Feels anxious. Has the patient traveled out of the country within the last 30 days? ---Not  Applicable Does the patient require triage? ---Yes Related visit to physician within the last 2 weeks? ---No Does the PT have any chronic conditions? (i.e. diabetes, asthma, etc.) ---Yes List chronic conditions. ---Seasonal Allergies Rh Arthritis COPD HTN High Cholesterol A Fib Fibromyalgia Guidelines Guideline Title Affirmed Question Affirmed Notes Nurse Date/Time (Eastern Time) Anxiety and Panic Attack Symptoms interfere with work or school [place], RN, Benjamine Mola 07/05/2014 9:13:21 PM Disp. Time [name] Disposition Final User 07/05/2014 8:48:32 PM Attempt made - no message left [place], RN, Benjamine Mola 07/05/2014 9:19:03 PM See Physician within 24 Hours Yes [place], RN, Lorin Glass Understands: Yes Disagree/Comply: Comply PLEASE NOTE: All timestamps contained within this report are represented as [place] Standard Time. CONFIDENTIALTY NOTICE: This fax transmission is intended only for the addressee. It contains information that is legally privileged, confidential or otherwise protected from use or disclosure. If you are not the intended recipient, you are strictly prohibited from reviewing, disclosing, copying using or disseminating any of this information or taking any action in reliance on or regarding this information. If you have received this fax in error, please notify us immediately by telephone so that we can arrange for its return to [place]. Phone: [phone], Toll-Free: [phone], Fax: [phone] Page: 2 of 2 Call Id: [id number] Care Advice Given Per Guideline SEE PHYSICIAN WITHIN 24 HOURS: * IF OFFICE WILL BE OPEN: You need to be examined within the next 24 hours. Call your doctor when the office opens, and make an appointment. HEALTHY LIFESTYLE BASICS: * Sleep - Try to get sufficient amount of sleep. Most people need 7-8 hours of sleep each night. * Regular exercise will improve your overall health, improve your mood, and is a simple method to reduce  stress. * Eat a balanced healthy diet. * Drink adequate liquids - 6-8  glasses of water daily. AVOID TRIGGERS OF ANXIETY: * Limit your alcohol consumption to no more than 2 drinks a day. After dinner drinking causes insomnia 3-4 hours after falling asleep. * For smokers - Stop or reduce your smoking (Reason: nicotine is a stimulant) * Avoid diet pills (Reason: they act as stimulants.) * Talk to your doctor before taking any herbal supplements (Reason: some have side effects) CALL BACK IF: * You feel like harming yourself * You become worse. After Care Instructions Given Call Event Type User Date / Time Description Comments User: Lin Givens, RN Date/Time [name]: 07/05/2014 8:49:07 PM No Voice Mail on this phone, unable to leave message. User: Norton Blizzard Date/Time [name]: 07/05/2014 9:04:57 PM Missed the call from nurse Referrals REFERRED TO PCP OFFICE

## 2014-07-06 NOTE — Progress Notes (Signed)
Pre visit review using our clinic review tool, if applicable. No additional management support is needed unless otherwise documented below in the visit note. 

## 2014-07-06 NOTE — Telephone Encounter (Signed)
Pt has appt today at 12 noon with Dr Deborra Medina.

## 2014-07-06 NOTE — Patient Instructions (Signed)
Hang in there. I will call you with your lab results tomorrow.

## 2014-07-12 ENCOUNTER — Other Ambulatory Visit: Payer: Self-pay | Admitting: Cardiovascular Disease

## 2014-07-28 ENCOUNTER — Other Ambulatory Visit: Payer: Self-pay | Admitting: Cardiovascular Disease

## 2014-08-03 ENCOUNTER — Encounter: Payer: Self-pay | Admitting: Family Medicine

## 2014-08-03 ENCOUNTER — Ambulatory Visit (INDEPENDENT_AMBULATORY_CARE_PROVIDER_SITE_OTHER): Payer: Federal, State, Local not specified - PPO | Admitting: Family Medicine

## 2014-08-03 VITALS — BP 136/70 | HR 67 | Temp 97.6°F | Wt 328.2 lb

## 2014-08-03 DIAGNOSIS — G894 Chronic pain syndrome: Secondary | ICD-10-CM

## 2014-08-03 DIAGNOSIS — G2581 Restless legs syndrome: Secondary | ICD-10-CM

## 2014-08-03 DIAGNOSIS — M545 Low back pain: Secondary | ICD-10-CM

## 2014-08-03 MED ORDER — GABAPENTIN 300 MG PO CAPS: 300.0000 mg | ORAL_CAPSULE | Freq: Three times a day (TID) | ORAL | Status: DC

## 2014-08-03 MED ORDER — OXYCODONE HCL 5 MG PO TABS: ORAL_TABLET | ORAL | Status: DC

## 2014-08-03 MED ORDER — TORSEMIDE 20 MG PO TABS: 20.0000 mg | ORAL_TABLET | Freq: Every day | ORAL | Status: DC | PRN

## 2014-08-03 NOTE — Assessment & Plan Note (Signed)
Deteriorated. Will request xrays and records from Dr. Amil Amen.  May need MRI vs neurosurg referral given progression of symptoms. The patient indicates understanding of these issues and agrees with the plan.

## 2014-08-03 NOTE — Assessment & Plan Note (Addendum)
Deteriorating now with worsening back pain. >25 minutes spent in face to face time with patient, >50% spent in counselling or coordination of care Rx refilled for oxycodone but we had a long discussion about opioid tolerance and over use. She feels that once she gets her back pain sorted out, ie ? Surgery, she will be able to tolerate her RA and knee pain much better. The patient indicates understanding of these issues and agrees with the plan.

## 2014-08-03 NOTE — Assessment & Plan Note (Signed)
Improving. New rx with three times daily dosing of Gabapentin 300 mg sent to her pharmacy.

## 2014-08-03 NOTE — Progress Notes (Signed)
Pre visit review using our clinic review tool, if applicable. No additional management support is needed unless otherwise documented below in the visit note. 

## 2014-08-03 NOTE — Progress Notes (Signed)
Subjective:   Patient ID: Tina Pacheco, female    DOB: [birth year], 64 y.o.   MRN: [id number]  Tina Pacheco is a pleasant 64 y.o. year old female who presents to clinic today with Follow-up and pain management  on 08/03/2014  HPI:  RLS- she feels the gabapentin has been helping tremendously.  Has been taking 300 mg two to three times daily.  Less RLS.  symptoms.  Sleeping better at night but back pain still waking her up.  Lab Results  Component Value Date   CREATININE 0.81 07/06/2014   Low back pain with bilateral sciatica, right > left.  Ongoing for weeks to months.  Saw her rheumatologist, Dr. Amil Amen, last week per pt.  Per pt, xrays done and she would like me to review them. Denies any bowel or bladder incontinence.  Current Outpatient Prescriptions on File Prior to Visit  Medication Sig Dispense Refill  . albuterol (PROVENTIL HFA;VENTOLIN HFA) 108 (90 BASE) MCG/ACT inhaler Inhale 2 puffs into the lungs every 6 (six) hours as needed. For shortness of breath 3 Inhaler 1  . atorvastatin (LIPITOR) 20 MG tablet Take 1 tablet (20 mg total) by mouth daily. 90 tablet 3  . budesonide-formoterol (SYMBICORT) 160-4.5 MCG/ACT inhaler Inhale 2 puffs into the lungs 2 (two) times daily as needed. For shortness of breath 3 Inhaler 1  . dabigatran (PRADAXA) 150 MG CAPS capsule Take 1 capsule (150 mg total) by mouth 2 (two) times daily. 180 capsule 3  . Febuxostat (ULORIC PO) Take one by mouth daily, pt unsure of strength    . fluticasone (FLONASE) 50 MCG/ACT nasal spray Place 2 sprays into the nose daily as needed. For allergies     . KLOR-CON M20 20 MEQ tablet TAKE 1/2 TABLET (=10MEQ)   TWICE A DAY; MAY TAKE AN   EXTRA TABLET WHEN TAKING   TORSEMIDE 180 tablet 3  . leflunomide (ARAVA) 20 MG tablet Take 20 mg by mouth daily.    Marland Kitchen lisinopril (PRINIVIL,ZESTRIL) 10 MG tablet take 1 tablet by mouth once daily 90 tablet 1  . metoprolol succinate (TOPROL-XL) 50 MG 24 hr tablet take 1 tablet by mouth  once daily 30 tablet 3  . montelukast (SINGULAIR) 10 MG tablet Take 1 tablet (10 mg total) by mouth at bedtime. 30 tablet 3  . Multiple Vitamin (MULTIVITAMIN) tablet Take 1 tablet by mouth daily.      Marland Kitchen nystatin (MYCOSTATIN/NYSTOP) 100000 UNIT/GM POWD Apply to area three times daily as needed for rash 60 g 0  . pregabalin (LYRICA) 300 MG capsule Take 1 capsule (300 mg total) by mouth daily.    Marland Kitchen Specialty Vitamins Products (MAGNESIUM, AMINO ACID CHELATE,) 133 MG tablet Take 1 tablet by mouth at bedtime.     . SUMAtriptan (IMITREX) 25 MG tablet Take 1 tablet (25 mg total) by mouth every 2 (two) hours as needed for migraine or headache. May repeat in 2 hours if headache persists or recurs. 10 tablet 0  . terazosin (HYTRIN) 10 MG capsule take 1 capsule by mouth at bedtime 90 capsule 0   No current facility-administered medications on file prior to visit.    Allergies  Allergen Reactions  . Penicillins     Childhood allergies. But has tolerated amoxicillin and other cillins since     Past Medical History  Diagnosis Date  . Hypertension   . Dyslipidemia   . Arrhythmia     paroxysmal atrial fibrillation  . CHF (congestive heart failure)  chronic diastolic  . Morbid obesity   . Hypokalemia   . Coronary artery disease     nonobstructive CAD  . Asthma   . Rheumatoid arthritis(714.0)   . Basal cell carcinoma   . Hyperplastic colonic polyp 2003  . History of gout   . COPD (chronic obstructive pulmonary disease)   . Atrial fibrillation     Past Surgical History  Procedure Laterality Date  . Cardiac catheterization  05/24/2010    nonobstructive CAD  . Knee arthroscopy      bilateral  . Vaginal hysterectomy    . Cholecystectomy    . Cesarean section    . Colonoscopy  06/12/2011    Procedure: COLONOSCOPY;  Surgeon: Juanita Craver, MD;  Location: [facility];  Service: Endoscopy;  Laterality: N/A;  . Colonoscopy N/A 03/17/2013    Procedure: COLONOSCOPY;  Surgeon: Juanita Craver, MD;   Location: [facility];  Service: Endoscopy;  Laterality: N/A;    Family History  Problem Relation Age of Onset  . Tuberculosis Mother   . Parkinsonism Mother   . Diabetes type II Sister   . Breast cancer Sister   . Breast cancer Maternal Aunt     History   Social History  . Marital Status: Married    Spouse Name: N/A  . Number of Children: N/A  . Years of Education: N/A   Occupational History  . Not on file.   Social History Main Topics  . Smoking status: Never Smoker   . Smokeless tobacco: Never Used  . Alcohol Use: Yes     Comment: occasional-red wine  . Drug Use: No  . Sexual Activity: Not on file   Other Topics Concern  . Not on file   Social History Narrative   The PMH, PSH, Social History, Family History, Medications, and allergies have been reviewed in [facility], and have been updated if relevant.  Review of Systems  Constitutional: Positive for fatigue.  HENT: Negative.   Respiratory: Negative.   Cardiovascular: Negative.   Gastrointestinal: Negative.   Genitourinary: Negative.   Musculoskeletal: Positive for back pain and gait problem. Negative for myalgias, joint swelling and neck pain.  Neurological: Negative for dizziness and weakness.  Hematological: Negative.   Psychiatric/Behavioral: Negative.   All other systems reviewed and are negative.      Objective:    BP 136/70 mmHg  Pulse 67  Temp(Src) 97.6 F (36.4 C) (Oral)  Wt 328 lb 4 oz (148.893 kg)  SpO2 94%   Physical Exam  Constitutional: She is oriented to person, place, and time. She appears well-developed and well-nourished. No distress.  obese  HENT:  Head: Normocephalic.  Eyes: Conjunctivae are normal.  Cardiovascular: Normal rate.   Pulmonary/Chest: Effort normal.  Musculoskeletal: She exhibits no edema.  Ataxic gait  Neurological: She is alert and oriented to person, place, and time. No cranial nerve deficit.  Skin: Skin is warm and dry.  Psychiatric: She has a normal mood and  affect. Her behavior is normal. Judgment and thought content normal.  Nursing note reviewed.         Assessment & Plan:   Chronic pain syndrome  RLS (restless legs syndrome) No Follow-up on file.

## 2014-08-06 ENCOUNTER — Encounter: Payer: Self-pay | Admitting: Family Medicine

## 2014-08-09 ENCOUNTER — Inpatient Hospital Stay
Admission: EM | Admit: 2014-08-09 | Discharge: 2014-08-11 | DRG: 125 | Disposition: A | Discharge status: Home / Self Care | Payer: Federal, State, Local not specified - PPO | Attending: Internal Medicine | Admitting: Internal Medicine

## 2014-08-09 ENCOUNTER — Emergency Department: Payer: Federal, State, Local not specified - PPO

## 2014-08-09 ENCOUNTER — Encounter: Payer: Self-pay | Admitting: Emergency Medicine

## 2014-08-09 ENCOUNTER — Other Ambulatory Visit: Payer: Self-pay

## 2014-08-09 DIAGNOSIS — Z9049 Acquired absence of other specified parts of digestive tract: Secondary | ICD-10-CM | POA: Diagnosis present

## 2014-08-09 DIAGNOSIS — Z820 Family history of epilepsy and other diseases of the nervous system: Secondary | ICD-10-CM | POA: Diagnosis not present

## 2014-08-09 DIAGNOSIS — M545 Low back pain, unspecified: Secondary | ICD-10-CM | POA: Insufficient documentation

## 2014-08-09 DIAGNOSIS — I2510 Atherosclerotic heart disease of native coronary artery without angina pectoris: Secondary | ICD-10-CM | POA: Diagnosis present

## 2014-08-09 DIAGNOSIS — E785 Hyperlipidemia, unspecified: Secondary | ICD-10-CM | POA: Diagnosis present

## 2014-08-09 DIAGNOSIS — M549 Dorsalgia, unspecified: Secondary | ICD-10-CM | POA: Diagnosis present

## 2014-08-09 DIAGNOSIS — H109 Unspecified conjunctivitis: Secondary | ICD-10-CM | POA: Diagnosis present

## 2014-08-09 DIAGNOSIS — Z6841 Body Mass Index (BMI) 40.0 and over, adult: Secondary | ICD-10-CM | POA: Diagnosis not present

## 2014-08-09 DIAGNOSIS — R531 Weakness: Secondary | ICD-10-CM | POA: Diagnosis not present

## 2014-08-09 DIAGNOSIS — Z85828 Personal history of other malignant neoplasm of skin: Secondary | ICD-10-CM | POA: Diagnosis not present

## 2014-08-09 DIAGNOSIS — Z7951 Long term (current) use of inhaled steroids: Secondary | ICD-10-CM | POA: Diagnosis not present

## 2014-08-09 DIAGNOSIS — H00033 Abscess of eyelid right eye, unspecified eyelid: Secondary | ICD-10-CM | POA: Diagnosis present

## 2014-08-09 DIAGNOSIS — Z803 Family history of malignant neoplasm of breast: Secondary | ICD-10-CM

## 2014-08-09 DIAGNOSIS — I5032 Chronic diastolic (congestive) heart failure: Secondary | ICD-10-CM | POA: Diagnosis present

## 2014-08-09 DIAGNOSIS — M25561 Pain in right knee: Secondary | ICD-10-CM | POA: Diagnosis present

## 2014-08-09 DIAGNOSIS — H15001 Unspecified scleritis, right eye: Secondary | ICD-10-CM | POA: Diagnosis present

## 2014-08-09 DIAGNOSIS — I340 Nonrheumatic mitral (valve) insufficiency: Secondary | ICD-10-CM | POA: Diagnosis not present

## 2014-08-09 DIAGNOSIS — I214 Non-ST elevation (NSTEMI) myocardial infarction: Secondary | ICD-10-CM

## 2014-08-09 DIAGNOSIS — H15101 Unspecified episcleritis, right eye: Secondary | ICD-10-CM

## 2014-08-09 DIAGNOSIS — L03211 Cellulitis of face: Secondary | ICD-10-CM

## 2014-08-09 DIAGNOSIS — M109 Gout, unspecified: Secondary | ICD-10-CM | POA: Diagnosis present

## 2014-08-09 DIAGNOSIS — R7989 Other specified abnormal findings of blood chemistry: Secondary | ICD-10-CM | POA: Insufficient documentation

## 2014-08-09 DIAGNOSIS — E876 Hypokalemia: Secondary | ICD-10-CM | POA: Diagnosis present

## 2014-08-09 DIAGNOSIS — H1130 Conjunctival hemorrhage, unspecified eye: Secondary | ICD-10-CM | POA: Diagnosis present

## 2014-08-09 DIAGNOSIS — J45909 Unspecified asthma, uncomplicated: Secondary | ICD-10-CM | POA: Diagnosis present

## 2014-08-09 DIAGNOSIS — J449 Chronic obstructive pulmonary disease, unspecified: Secondary | ICD-10-CM | POA: Diagnosis present

## 2014-08-09 DIAGNOSIS — Z90710 Acquired absence of both cervix and uterus: Secondary | ICD-10-CM | POA: Diagnosis not present

## 2014-08-09 DIAGNOSIS — I10 Essential (primary) hypertension: Secondary | ICD-10-CM | POA: Diagnosis present

## 2014-08-09 DIAGNOSIS — I480 Paroxysmal atrial fibrillation: Secondary | ICD-10-CM | POA: Diagnosis present

## 2014-08-09 DIAGNOSIS — R778 Other specified abnormalities of plasma proteins: Secondary | ICD-10-CM | POA: Insufficient documentation

## 2014-08-09 DIAGNOSIS — W19XXXA Unspecified fall, initial encounter: Secondary | ICD-10-CM | POA: Diagnosis present

## 2014-08-09 DIAGNOSIS — Z833 Family history of diabetes mellitus: Secondary | ICD-10-CM

## 2014-08-09 DIAGNOSIS — G2581 Restless legs syndrome: Secondary | ICD-10-CM | POA: Diagnosis present

## 2014-08-09 DIAGNOSIS — M069 Rheumatoid arthritis, unspecified: Secondary | ICD-10-CM | POA: Diagnosis present

## 2014-08-09 DIAGNOSIS — Z79899 Other long term (current) drug therapy: Secondary | ICD-10-CM

## 2014-08-09 DIAGNOSIS — Z9889 Other specified postprocedural states: Secondary | ICD-10-CM

## 2014-08-09 DIAGNOSIS — M25562 Pain in left knee: Secondary | ICD-10-CM | POA: Diagnosis present

## 2014-08-09 DIAGNOSIS — Z86010 Personal history of colonic polyps: Secondary | ICD-10-CM

## 2014-08-09 DIAGNOSIS — M5440 Lumbago with sciatica, unspecified side: Secondary | ICD-10-CM | POA: Diagnosis not present

## 2014-08-09 HISTORY — DX: Other specified postprocedural states: Z98.890

## 2014-08-09 HISTORY — DX: Other ill-defined heart diseases: I51.89

## 2014-08-09 HISTORY — DX: Paroxysmal atrial fibrillation: I48.0

## 2014-08-09 LAB — CBC WITH DIFFERENTIAL/PLATELET
Basophils Absolute: 0 10*3/uL (ref 0–0.1)
Basophils Relative: 0 %
EOS ABS: 0 10*3/uL (ref 0–0.7)
EOS PCT: 0 %
HCT: 33.2 % — ABNORMAL LOW (ref 35.0–47.0)
Hemoglobin: 11.1 g/dL — ABNORMAL LOW (ref 12.0–16.0)
LYMPHS PCT: 6 %
Lymphs Abs: 0.5 10*3/uL — ABNORMAL LOW (ref 1.0–3.6)
MCH: 28.2 pg (ref 26.0–34.0)
MCHC: 33.4 g/dL (ref 32.0–36.0)
MCV: 84.5 fL (ref 80.0–100.0)
MONO ABS: 0.8 10*3/uL (ref 0.2–0.9)
MONOS PCT: 9 %
NEUTROS ABS: 7.3 10*3/uL — AB (ref 1.4–6.5)
Neutrophils Relative %: 85 %
PLATELETS: 153 10*3/uL (ref 150–440)
RBC: 3.93 MIL/uL (ref 3.80–5.20)
RDW: 15.5 % — ABNORMAL HIGH (ref 11.5–14.5)
WBC: 8.6 10*3/uL (ref 3.6–11.0)

## 2014-08-09 LAB — URINALYSIS COMPLETE WITH MICROSCOPIC (ARMC ONLY)
BACTERIA UA: NONE SEEN
BILIRUBIN URINE: NEGATIVE
Glucose, UA: NEGATIVE mg/dL
Ketones, ur: NEGATIVE mg/dL
Leukocytes, UA: NEGATIVE
Nitrite: NEGATIVE
PH: 6 (ref 5.0–8.0)
Protein, ur: 30 mg/dL — AB
SPECIFIC GRAVITY, URINE: 1.012 (ref 1.005–1.030)

## 2014-08-09 LAB — COMPREHENSIVE METABOLIC PANEL
ALT: 16 U/L (ref 14–54)
ANION GAP: 8 (ref 5–15)
AST: 31 U/L (ref 15–41)
Albumin: 3.5 g/dL (ref 3.5–5.0)
Alkaline Phosphatase: 75 U/L (ref 38–126)
BILIRUBIN TOTAL: 1.5 mg/dL — AB (ref 0.3–1.2)
BUN: 35 mg/dL — AB (ref 6–20)
CO2: 24 mmol/L (ref 22–32)
Calcium: 8.9 mg/dL (ref 8.9–10.3)
Chloride: 106 mmol/L (ref 101–111)
Creatinine, Ser: 1.37 mg/dL — ABNORMAL HIGH (ref 0.44–1.00)
GFR calc Af Amer: 46 mL/min — ABNORMAL LOW (ref >60)
GFR, EST NON AFRICAN AMERICAN: 40 mL/min — AB (ref >60)
GLUCOSE: 118 mg/dL — AB (ref 65–99)
Potassium: 4.3 mmol/L (ref 3.5–5.1)
SODIUM: 138 mmol/L (ref 135–145)
Total Protein: 6.8 g/dL (ref 6.5–8.1)

## 2014-08-09 LAB — TROPONIN I: TROPONIN I: 0.34 ng/mL — AB (ref <0.031)

## 2014-08-09 MED ORDER — PIPERACILLIN-TAZOBACTAM 3.375 G IVPB
3.3750 g | Freq: Once | INTRAVENOUS | Status: AC
  Administered 2014-08-09: 3.375 g via INTRAVENOUS

## 2014-08-09 MED ORDER — SODIUM CHLORIDE 0.9 % IV BOLUS (SEPSIS)
1000.0000 mL | Freq: Once | INTRAVENOUS | Status: AC
  Administered 2014-08-09: 1000 mL via INTRAVENOUS

## 2014-08-09 MED ORDER — PIPERACILLIN-TAZOBACTAM 3.375 G IVPB
INTRAVENOUS | Status: AC
  Administered 2014-08-09: 3.375 g via INTRAVENOUS
  Filled 2014-08-09: qty 50

## 2014-08-09 MED ORDER — ASPIRIN 81 MG PO CHEW
324.0000 mg | CHEWABLE_TABLET | Freq: Once | ORAL | Status: AC
  Administered 2014-08-09: 324 mg via ORAL

## 2014-08-09 MED ORDER — VANCOMYCIN HCL IN DEXTROSE 1-5 GM/200ML-% IV SOLN
1000.0000 mg | Freq: Once | INTRAVENOUS | Status: AC
  Administered 2014-08-09: 1000 mg via INTRAVENOUS

## 2014-08-09 MED ORDER — VANCOMYCIN HCL IN DEXTROSE 1-5 GM/200ML-% IV SOLN
INTRAVENOUS | Status: AC
  Administered 2014-08-09: 1000 mg via INTRAVENOUS
  Filled 2014-08-09: qty 200

## 2014-08-09 MED ORDER — ASPIRIN 81 MG PO CHEW
CHEWABLE_TABLET | ORAL | Status: AC
  Administered 2014-08-09: 324 mg via ORAL
  Filled 2014-08-09: qty 4

## 2014-08-09 NOTE — ED Notes (Signed)
md notified of eleveted troponin.

## 2014-08-09 NOTE — ED Notes (Signed)
Patient with red and swollen right eye. Patient reports that started three days ago.

## 2014-08-09 NOTE — H&P (Signed)
[facility] [place] NAME: [name]    MR#:  [id number]  DATE OF BIRTH:  09-30-1950  DATE OF ADMISSION:  08/09/2014  PRIMARY CARE PHYSICIAN: Arnette Norris, MD   REQUESTING/REFERRING PHYSICIAN: Paduchowski  CHIEF COMPLAINT:   Chief Complaint  Patient presents with  . Weakness    HISTORY OF PRESENT ILLNESS:  Tina Pacheco  is a 64 y.o. female who presents with 3 days of progressive malaise and generalized weakness. Patient states that she developed right eye conjunctivitis 3 days ago, but which got acutely worse today. She presented to the ED after a fall in the bathroom. She did have some left shoulder pain 3 days ago as well, which she attributed to her rheumatoid arthritis. She denies any overt chest pain, shortness of breath, diarrhea. She has had some nausea and vomiting for 1 day. In the ED she was found to have a troponin elevated at 0.3.  On interview, she denied any coronary disease, however on chart review she has a listed, coronary artery disease. She also has A. fib for which she is normally anticoagulated on Pradaxa. She states that due to her nausea and vomiting she has not taken most of her pills, rather has not kept down her medications for the past day and a half. Her right eye conjunctivitis, significantly worse today, with erythema spreading to her eyelid with the appearance of a cellulitis. She also has significant scleral edema and injection. Hospitalists were called for elevated troponin/likely non- STEMI, and possible cellulitis of her right eye.  PAST MEDICAL HISTORY:   Past Medical History  Diagnosis Date  . Hypertension   . Dyslipidemia   . Arrhythmia     paroxysmal atrial fibrillation  . CHF (congestive heart failure)     chronic diastolic  . Morbid obesity   . Hypokalemia   . Coronary artery disease     nonobstructive CAD  . Asthma   . Rheumatoid arthritis(714.0)   . Basal cell carcinoma   .  Hyperplastic colonic polyp 2003  . History of gout   . COPD (chronic obstructive pulmonary disease)   . Atrial fibrillation     PAST SURGICAL HISTORY:   Past Surgical History  Procedure Laterality Date  . Cardiac catheterization  05/24/2010    nonobstructive CAD  . Knee arthroscopy      bilateral  . Vaginal hysterectomy    . Cholecystectomy    . Cesarean section    . Colonoscopy  06/12/2011    Procedure: COLONOSCOPY;  Surgeon: Juanita Craver, MD;  Location: [facility];  Service: Endoscopy;  Laterality: N/A;  . Colonoscopy N/A 03/17/2013    Procedure: COLONOSCOPY;  Surgeon: Juanita Craver, MD;  Location: [facility];  Service: Endoscopy;  Laterality: N/A;    SOCIAL HISTORY:   History  Substance Use Topics  . Smoking status: Never Smoker   . Smokeless tobacco: Never Used  . Alcohol Use: No    FAMILY HISTORY:   Family History  Problem Relation Age of Onset  . Tuberculosis Mother   . Parkinsonism Mother   . Diabetes type II Sister   . Breast cancer Sister   . Breast cancer Maternal Aunt     DRUG ALLERGIES:  No Known Allergies  MEDICATIONS AT HOME:   Prior to Admission medications   Medication Sig Start Date End Date Taking? Authorizing Provider  atorvastatin (LIPITOR) 20 MG tablet Take 1 tablet (20 mg total) by mouth daily. 07/22/13  Yes  Lucille Passy, MD  dabigatran (PRADAXA) 150 MG CAPS capsule Take 1 capsule (150 mg total) by mouth 2 (two) times daily. 05/23/13  Yes Minna Merritts, MD  Febuxostat (ULORIC PO) 40 mg daily. Take one by mouth daily, pt unsure of strength   Yes Historical Provider, MD  fluticasone (FLONASE) 50 MCG/ACT nasal spray Place 2 sprays into the nose daily as needed. For allergies    Yes Historical Provider, MD  gabapentin (NEURONTIN) 300 MG capsule Take 1 capsule (300 mg total) by mouth 3 (three) times daily. Take 1 capsule 2 to 3 hours prior to bedtime. 08/03/14  Yes Lucille Passy, MD  hydroxychloroquine (PLAQUENIL) 200 MG tablet Take by mouth 2 (two)  times daily.   Yes Historical Provider, MD  KLOR-CON M20 20 MEQ tablet TAKE 1/2 TABLET (=10MEQ)   TWICE A DAY; MAY TAKE AN   EXTRA TABLET WHEN TAKING   TORSEMIDE 07/28/14  Yes Minna Merritts, MD  leflunomide (ARAVA) 20 MG tablet Take 20 mg by mouth daily. 09/20/11  Yes Historical Provider, MD  lisinopril (PRINIVIL,ZESTRIL) 10 MG tablet take 1 tablet by mouth once daily 03/17/14  Yes Lucille Passy, MD  loratadine (CLARITIN) 10 MG tablet Take 10 mg by mouth daily.   Yes Historical Provider, MD  metoprolol succinate (TOPROL-XL) 50 MG 24 hr tablet take 1 tablet by mouth once daily 07/13/14  Yes Minna Merritts, MD  montelukast (SINGULAIR) 10 MG tablet Take 1 tablet (10 mg total) by mouth at bedtime. 06/29/14  Yes Lucille Passy, MD  pregabalin (LYRICA) 300 MG capsule Take 1 capsule (300 mg total) by mouth daily. Patient taking differently: Take 150 mg by mouth 2 (two) times daily.  04/16/13  Yes Lucille Passy, MD  terazosin (HYTRIN) 10 MG capsule take 1 capsule by mouth at bedtime 05/15/14  Yes Lucille Passy, MD  torsemide (DEMADEX) 20 MG tablet Take 1 tablet (20 mg total) by mouth daily as needed. Edema 08/03/14  Yes Lucille Passy, MD  albuterol (PROVENTIL HFA;VENTOLIN HFA) 108 (90 BASE) MCG/ACT inhaler Inhale 2 puffs into the lungs every 6 (six) hours as needed. For shortness of breath 03/14/12   Lucille Passy, MD  budesonide-formoterol [facility]) 160-4.5 MCG/ACT inhaler Inhale 2 puffs into the lungs 2 (two) times daily as needed. For shortness of breath 03/14/12   Lucille Passy, MD  Multiple Vitamin (MULTIVITAMIN) tablet Take 1 tablet by mouth daily.      Historical Provider, MD  nystatin (MYCOSTATIN/NYSTOP) 100000 UNIT/GM POWD Apply to area three times daily as needed for rash 06/20/12   Lucille Passy, MD  oxyCODONE (ROXICODONE) 5 MG immediate release tablet 2 tablets every 12 hours 08/03/14   Lucille Passy, MD  Specialty Vitamins Products (MAGNESIUM, AMINO ACID CHELATE,) 133 MG tablet Take 1 tablet by mouth at bedtime.      Historical Provider, MD  SUMAtriptan (IMITREX) 25 MG tablet Take 1 tablet (25 mg total) by mouth every 2 (two) hours as needed for migraine or headache. May repeat in 2 hours if headache persists or recurs. 04/08/14   Lucille Passy, MD    REVIEW OF SYSTEMS:  Review of Systems  Constitutional: Positive for fever (low grade, here in the ED) and malaise/fatigue. Negative for chills and weight loss.  HENT: Negative for ear pain, hearing loss and tinnitus.        Right eye conjunctivitis with scleral injection, see history of present illness for details, nontender.  Eyes: Negative for blurred vision, double vision, pain and redness.  Respiratory: Negative for cough, hemoptysis and shortness of breath.   Cardiovascular: Negative for chest pain, palpitations, orthopnea and leg swelling.  Gastrointestinal: Positive for nausea and vomiting. Negative for abdominal pain, diarrhea and constipation.  Genitourinary: Negative for dysuria, frequency and hematuria.  Musculoskeletal: Positive for joint pain (left shoulder). Negative for back pain and neck pain.  Skin:       Right upper eyelid erythema, No acne, rash, or lesions  Neurological: Positive for weakness. Negative for dizziness, tremors and focal weakness.  Endo/Heme/Allergies: Negative for polydipsia. Does not bruise/bleed easily.  Psychiatric/Behavioral: Negative for depression. The patient is not nervous/anxious and does not have insomnia.      VITAL SIGNS:   Filed Vitals:   08/09/14 1936 08/09/14 2104 08/09/14 2130 08/09/14 2315  BP: 153/66 129/44 148/81 140/64  Pulse: 90 81 76 77  Temp: 100 F (37.8 C)   99.3 F (37.4 C)  TempSrc: Oral     Resp: 18 18 22 20   Height: 5\' 4"  (1.626 m)     Weight: 147.419 kg (325 lb)     SpO2: 95% 94% 93% 94%   Wt Readings from Last 3 Encounters:  08/09/14 147.419 kg (325 lb)  08/03/14 148.893 kg (328 lb 4 oz)  07/06/14 142.429 kg (314 lb)    PHYSICAL EXAMINATION:  Physical Exam   Constitutional: She is oriented to person, place, and time. She appears well-developed and well-nourished. No distress.  HENT:  Head: Normocephalic and atraumatic.  Mouth/Throat: Oropharynx is clear and moist.  Eyes: EOM are normal. Pupils are equal, round, and reactive to light. No scleral icterus.  Right sclera diffusely injected and swollen, with right upper eyelid erythema, nontender  Neck: Normal range of motion. Neck supple. No JVD present. No thyromegaly present.  Cardiovascular: Normal rate, regular rhythm and intact distal pulses.  Exam reveals no gallop and no friction rub.   No murmur heard. Respiratory: Effort normal and breath sounds normal. No respiratory distress. She has no wheezes. She has no rales.  GI: Soft. Bowel sounds are normal. She exhibits no distension. There is no tenderness.  Musculoskeletal: Normal range of motion. She exhibits edema (bilateral lower extremity).  No arthritis, no gout  Lymphadenopathy:    She has no cervical adenopathy.  Neurological: She is alert and oriented to person, place, and time. No cranial nerve deficit.  No dysarthria, no aphasia  Skin: Skin is warm and dry. No rash noted. There is erythema (right upper eyelid).  Psychiatric: She has a normal mood and affect. Her behavior is normal. Judgment and thought content normal.    LABORATORY PANEL:   CBC  Recent Labs Lab 08/09/14 2016  WBC 8.6  HGB 11.1*  HCT 33.2*  PLT 153   ------------------------------------------------------------------------------------------------------------------  Chemistries   Recent Labs Lab 08/09/14 2016  NA 138  K 4.3  CL 106  CO2 24  GLUCOSE 118*  BUN 35*  CREATININE 1.37*  CALCIUM 8.9  AST 31  ALT 16  ALKPHOS 75  BILITOT 1.5*   ------------------------------------------------------------------------------------------------------------------  Cardiac Enzymes  Recent Labs Lab 08/09/14 2016  TROPONINI 0.34*    ------------------------------------------------------------------------------------------------------------------  RADIOLOGY:  Dg Chest Portable 1 View  08/09/2014   CLINICAL DATA:  Poor appetite for 3 days. Status post fall while trying to go to bathroom. Weakness and nausea. Initial encounter.  EXAM: PORTABLE CHEST - 1 VIEW  COMPARISON:  Chest radiograph performed 08/30/2011  FINDINGS: The lungs are well-aerated.  Vascular congestion is noted, with increased interstitial markings, concerning for mild interstitial edema. There is no evidence of pleural effusion or pneumothorax.  The cardiomediastinal silhouette is mildly enlarged. No acute osseous abnormalities are seen.  IMPRESSION: Vascular congestion and mild cardiomegaly, with increased interstitial markings, concerning for mild interstitial edema. No definite displaced rib fracture seen.   Electronically Signed   By: Garald Balding M.D.   On: 08/09/2014 21:05    EKG:   Orders placed or performed in visit on 06/17/14  . EKG 12-Lead    IMPRESSION AND PLAN:  Principal Problem:   NSTEMI (non-ST elevated myocardial infarction) - without typical angina, though she did have an episode of left shoulder pain a couple of days ago which might be an anginal equivalent. Troponin elevated to 0.3 here today. No EKG changes. As she is normally anticoagulated for her A. fib, but has missed her last couple days of Pradaxa, rather has not kept them down due to her vomiting, we will start her on a heparin drip. Order echocardiogram, and cardiology consult. Active Problems:   Scleritis and episcleritis of right eye - phone consult to ophthalmology from the ED by ED provider, likely severe conjunctivitis however, we will have ophthalmology consult in the morning. Nontender, without vision changes.   Atrial fibrillation - currently sinus, and normal rate. Continue home medications, and heparin as above for anticoagulation for now. Due to this we will hold her  home dose of Pradaxa for now.   CAD (coronary artery disease) - continue routine home medications for this, except for anticoagulation as stated above.   HTN (hypertension) - chronic stable, continue home meds   Rheumatoid arthritis - continue home plaquenil   RLS (restless legs syndrome) - continue home meds   All the records are reviewed and case discussed with ED provider. Management plans discussed with the patient and/or family.  DVT PROPHYLAXIS: systemic anticoagulation  ADMISSION STATUS: Inpatient  CODE STATUS: Full  TOTAL TIME TAKING CARE OF THIS PATIENT: 55 minutes.    Tina Pacheco 08/10/2014, 12:11 AM  [facility]  Office  [phone]  CC: Primary care physician; Arnette Norris, MD

## 2014-08-09 NOTE — ED Provider Notes (Signed)
[facility] Emergency Department Provider Note  Time seen: 8:39 PM  I have reviewed the triage vital signs and the nursing notes.   HISTORY  Chief Complaint Weakness    HPI Tina Pacheco is a 64 y.o. female with a past medical history of hypertension, hyperlipidemia, CHF, morbid obesity, hypokalemia, rheumatoid arthritis, COPD, atrial fibrillation who presents to the emergency department with 3-4 days of increased weakness, decreased appetite, unable to ambulate due to fatigue today causing her to fall. Low-grade fevers at home around 100 per palpation. Denies dysuria. Denies abdominal pain, nausea, vomiting, diarrhea. Denies cough or congestion. Patient states she just feels extremely tired, and unable to stand due to weakness. Describes her weakness as severe, but denies any pain. Tonight the patient was attempting to get to the restroom, but became so weak she fell so she came to the emergency department. Denies any injuries from the fall.    Past Medical History  Diagnosis Date  . Hypertension   . Dyslipidemia   . Arrhythmia     paroxysmal atrial fibrillation  . CHF (congestive heart failure)     chronic diastolic  . Morbid obesity   . Hypokalemia   . Coronary artery disease     nonobstructive CAD  . Asthma   . Rheumatoid arthritis(714.0)   . Basal cell carcinoma   . Hyperplastic colonic polyp 2003  . History of gout   . COPD (chronic obstructive pulmonary disease)   . Atrial fibrillation     Patient Active Problem List   Diagnosis Date Noted  . RLS (restless legs syndrome) 07/06/2014  . Paresthesia and pain of right extremity 05/26/2014  . Gout 05/26/2014  . Chronic pain syndrome 05/26/2014  . Migraine 04/08/2014  . Acquired left calf asymmetry 01/05/2014  . Allergic conjunctivitis 09/16/2013  . Back pain 07/22/2013  . Metatarsalgia of right foot 07/22/2013  . Insomnia 07/15/2013  . Shoulder pain, bilateral 04/16/2013  . Asthma,  chronic 04/16/2013  . OA (osteoarthritis) 11/20/2012  . DJD (degenerative joint disease) of cervical spine 09/10/2012  . Right knee pain 11/16/2011  . Chronic cough 08/17/2011  . Allergic rhinitis 07/10/2011  . Rheumatoid arthritis 03/06/2011  . Obesity 06/22/2010  . Hyperlipidemia 06/22/2010  . HTN (hypertension) 06/22/2010  . CAD (coronary artery disease) 06/22/2010  . Diastolic dysfunction 21/30/8657  . Atrial fibrillation 05/27/2010  . Long term (current) use of anticoagulants 05/27/2010    Past Surgical History  Procedure Laterality Date  . Cardiac catheterization  05/24/2010    nonobstructive CAD  . Knee arthroscopy      bilateral  . Vaginal hysterectomy    . Cholecystectomy    . Cesarean section    . Colonoscopy  06/12/2011    Procedure: COLONOSCOPY;  Surgeon: Juanita Craver, MD;  Location: [facility];  Service: Endoscopy;  Laterality: N/A;  . Colonoscopy N/A 03/17/2013    Procedure: COLONOSCOPY;  Surgeon: Juanita Craver, MD;  Location: [facility] ENDOSCOPY;  Service: Endoscopy;  Laterality: N/A;    Current Outpatient Rx  Name  Route  Sig  Dispense  Refill  . albuterol (PROVENTIL HFA;VENTOLIN HFA) 108 (90 BASE) MCG/ACT inhaler   Inhalation   Inhale 2 puffs into the lungs every 6 (six) hours as needed. For shortness of breath   3 Inhaler   1   . atorvastatin (LIPITOR) 20 MG tablet   Oral   Take 1 tablet (20 mg total) by mouth daily.   90 tablet   3   . budesonide-formoterol (  SYMBICORT) 160-4.5 MCG/ACT inhaler   Inhalation   Inhale 2 puffs into the lungs 2 (two) times daily as needed. For shortness of breath   3 Inhaler   1   . dabigatran (PRADAXA) 150 MG CAPS capsule   Oral   Take 1 capsule (150 mg total) by mouth 2 (two) times daily.   180 capsule   3   . Febuxostat (ULORIC PO)      Take one by mouth daily, pt unsure of strength         . fluticasone (FLONASE) 50 MCG/ACT nasal spray   Nasal   Place 2 sprays into the nose daily as needed. For allergies           . gabapentin (NEURONTIN) 300 MG capsule   Oral   Take 1 capsule (300 mg total) by mouth 3 (three) times daily. Take 1 capsule 2 to 3 hours prior to bedtime.   90 capsule   3   . KLOR-CON M20 20 MEQ tablet      TAKE 1/2 TABLET (=10MEQ)   TWICE A DAY; MAY TAKE AN   EXTRA TABLET WHEN TAKING   TORSEMIDE   180 tablet   3   . leflunomide (ARAVA) 20 MG tablet   Oral   Take 20 mg by mouth daily.         Marland Kitchen lisinopril (PRINIVIL,ZESTRIL) 10 MG tablet      take 1 tablet by mouth once daily   90 tablet   1   . metoprolol succinate (TOPROL-XL) 50 MG 24 hr tablet      take 1 tablet by mouth once daily   30 tablet   3   . montelukast (SINGULAIR) 10 MG tablet   Oral   Take 1 tablet (10 mg total) by mouth at bedtime.   30 tablet   3   . Multiple Vitamin (MULTIVITAMIN) tablet   Oral   Take 1 tablet by mouth daily.           Marland Kitchen nystatin (MYCOSTATIN/NYSTOP) 100000 UNIT/GM POWD      Apply to area three times daily as needed for rash   60 g   0   . oxyCODONE (ROXICODONE) 5 MG immediate release tablet      2 tablets every 12 hours   270 tablet   0   . pregabalin (LYRICA) 300 MG capsule   Oral   Take 1 capsule (300 mg total) by mouth daily.         Marland Kitchen Specialty Vitamins Products (MAGNESIUM, AMINO ACID CHELATE,) 133 MG tablet   Oral   Take 1 tablet by mouth at bedtime.          . SUMAtriptan (IMITREX) 25 MG tablet   Oral   Take 1 tablet (25 mg total) by mouth every 2 (two) hours as needed for migraine or headache. May repeat in 2 hours if headache persists or recurs.   10 tablet   0   . terazosin (HYTRIN) 10 MG capsule      take 1 capsule by mouth at bedtime   90 capsule   0   . torsemide (DEMADEX) 20 MG tablet   Oral   Take 1 tablet (20 mg total) by mouth daily as needed. Edema   30 tablet   3     Allergies Review of patient's allergies indicates no active allergies.  Family History  Problem Relation Age of Onset  . Tuberculosis Mother   .  Parkinsonism  Mother   . Diabetes type II Sister   . Breast cancer Sister   . Breast cancer Maternal Aunt     Social History History  Substance Use Topics  . Smoking status: Never Smoker   . Smokeless tobacco: Never Used  . Alcohol Use: No    Review of Systems Constitutional: Low-grade fevers at home. Generalized weakness. Eyes: Right eye infection/erythema 3 days. Cardiovascular: Negative for chest pain. Respiratory: Negative for shortness of breath. Gastrointestinal: Negative for abdominal pain, vomiting and diarrhea. Genitourinary: Negative for dysuria. Skin: Redness and swelling of the right eye Neurological: Negative for headache 10-point ROS otherwise negative.  ____________________________________________   PHYSICAL EXAM:  VITAL SIGNS: ED Triage Vitals  Enc Vitals Group     BP 08/09/14 1936 153/66 mmHg     Pulse Rate 08/09/14 1936 90     Resp 08/09/14 1936 18     Temp 08/09/14 1936 100 F (37.8 C)     Temp Source 08/09/14 1936 Oral     SpO2 08/09/14 1936 95 %     Weight 08/09/14 1936 325 lb (147.419 kg)     Height 08/09/14 1936 5\' 4"  (1.626 m)     Head Cir --      Peak Flow --      Pain Score 08/09/14 1936 2     Pain Loc --      Pain Edu? --      Excl. in [place]? --     Constitutional: Alert and oriented. Lying in bed, appears tired/week. Morbidly obese. Eyes: Right eye lid is significantly swollen, erythematous, upon retraction patient has significant subconjunctival injection, with significant right eye discharge. Left eye appears normal. Intraocular muscles intact. Pupils remain reactive bilaterally. ENT   Head: Normocephalic and atraumatic.   Nose: No congestion/rhinnorhea.   Mouth/Throat: Mucous membranes are moist. Cardiovascular: Normal rate, regular rhythm.  Respiratory: Normal respiratory effort without tachypnea nor retractions. Breath sounds are clear and equal bilaterally. No wheezes/rales/rhonchi. Gastrointestinal: Soft and nontender.  No distention.  Obese. Musculoskeletal: Nontender with normal range of motion in all extremities.  Neurologic:  Normal speech and language. No gross focal neurologic deficits. Skin:  Skin is warm, dry and intact.  Psychiatric: Mood and affect are normal. Speech and behavior are normal.  ____________________________________________    EKG  EKG reviewed and interpreted by myself shows normal sinus rhythm at 90 bpm, narrow QRS, normal axis, normal intervals, no ST changes noted.  ____________________________________________    RADIOLOGY  Chest x-ray consistent with interstitial edema  ____________________________________________   INITIAL IMPRESSION / ASSESSMENT AND PLAN / ED COURSE  Pertinent labs & imaging results that were available during my care of the patient were reviewed by me and considered in my medical decision making (see chart for details).  Patient with generalized weakness for the past 3 days, now with a fall due to weakness. Only concerning finding on exam is right eye blepharitis/periorbital edema/cellulitis, with conjunctival injection and eye discharge, most significant with conjunctivitis with likely cellulitis. Given patient's temperature of 100.0, redness and swelling of the right eyelid with eye discharge, we will cover with antibiotics for likely periorbital cellulitis.  Patient's labs have resulted showing a troponin of 0.3, possible NSTEMI explaining the patient's weakness versus a myocarditis. We will cover with aspirin and admitted to the hospital for further evaluation. I discussed results with the patient is agreeable. Patient also with a slight creatinine elevation, we are currently mildly IV hydrating given her likely interstitial edema on chest x-ray.  CRITICAL CARE Performed by: Harvest Dark   Total critical care time: 30 minutes  Critical care time was exclusive of separately billable procedures and treating other patients.  Critical  care was necessary to treat or prevent imminent or life-threatening deterioration.  Critical care was time spent personally by me on the following activities: development of treatment plan with patient and/or surrogate as well as nursing, discussions with consultants, evaluation of patient's response to treatment, examination of patient, obtaining history from patient or surrogate, ordering and performing treatments and interventions, ordering and review of laboratory studies, ordering and review of radiographic studies, pulse oximetry and re-evaluation of patient's condition.   ____________________________________________   FINAL CLINICAL IMPRESSION(S) / ED DIAGNOSES  Periorbital edema/cellulitis Weakness Fall NSTEMI   Harvest Dark, MD 08/09/14 2138

## 2014-08-09 NOTE — ED Notes (Signed)
X-ray at bedside

## 2014-08-09 NOTE — ED Notes (Signed)
Patient brought in by ems from home. Patient with weakness and poor appetite times 3 days. Patient states that she started to feel nauseated today. Patient fell trying to get to the bathroom. Denies any injuries.

## 2014-08-10 ENCOUNTER — Inpatient Hospital Stay (HOSPITAL_COMMUNITY)
Admit: 2014-08-10 | Discharge: 2014-08-10 | Disposition: A | Discharge status: Home / Self Care | Payer: Federal, State, Local not specified - PPO | Attending: Internal Medicine | Admitting: Internal Medicine

## 2014-08-10 DIAGNOSIS — I340 Nonrheumatic mitral (valve) insufficiency: Secondary | ICD-10-CM

## 2014-08-10 DIAGNOSIS — I480 Paroxysmal atrial fibrillation: Secondary | ICD-10-CM

## 2014-08-10 DIAGNOSIS — R7989 Other specified abnormal findings of blood chemistry: Secondary | ICD-10-CM

## 2014-08-10 LAB — PROTIME-INR
INR: 1.3
Prothrombin Time: 16.4 seconds — ABNORMAL HIGH (ref 11.4–15.0)

## 2014-08-10 LAB — TROPONIN I
TROPONIN I: 0.35 ng/mL — AB (ref <0.031)
Troponin I: 0.79 ng/mL — ABNORMAL HIGH (ref <0.031)
Troponin I: 1.11 ng/mL — ABNORMAL HIGH (ref <0.031)

## 2014-08-10 LAB — CBC
HEMATOCRIT: 33.8 % — AB (ref 35.0–47.0)
HEMOGLOBIN: 10.8 g/dL — AB (ref 12.0–16.0)
MCH: 27.3 pg (ref 26.0–34.0)
MCHC: 32.1 g/dL (ref 32.0–36.0)
MCV: 85.2 fL (ref 80.0–100.0)
Platelets: 146 10*3/uL — ABNORMAL LOW (ref 150–440)
RBC: 3.96 MIL/uL (ref 3.80–5.20)
RDW: 15.8 % — AB (ref 11.5–14.5)
WBC: 7.5 10*3/uL (ref 3.6–11.0)

## 2014-08-10 LAB — HEPARIN LEVEL (UNFRACTIONATED): Heparin Unfractionated: 0.2 IU/mL — ABNORMAL LOW (ref 0.30–0.70)

## 2014-08-10 LAB — BASIC METABOLIC PANEL
ANION GAP: 9 (ref 5–15)
BUN: 31 mg/dL — AB (ref 6–20)
CALCIUM: 8.2 mg/dL — AB (ref 8.9–10.3)
CO2: 23 mmol/L (ref 22–32)
CREATININE: 1.19 mg/dL — AB (ref 0.44–1.00)
Chloride: 109 mmol/L (ref 101–111)
GFR calc Af Amer: 55 mL/min — ABNORMAL LOW (ref >60)
GFR calc non Af Amer: 47 mL/min — ABNORMAL LOW (ref >60)
GLUCOSE: 115 mg/dL — AB (ref 65–99)
Potassium: 4.2 mmol/L (ref 3.5–5.1)
Sodium: 141 mmol/L (ref 135–145)

## 2014-08-10 LAB — APTT: APTT: 84 s — AB (ref 24–36)

## 2014-08-10 MED ORDER — SODIUM CHLORIDE 0.9 % IV SOLN
INTRAVENOUS | Status: DC
Start: 2014-08-10 — End: 2014-08-10
  Administered 2014-08-10: 02:00:00 via INTRAVENOUS

## 2014-08-10 MED ORDER — PIPERACILLIN-TAZOBACTAM 3.375 G IVPB: 3.3750 g | Freq: Once | INTRAVENOUS | Status: DC

## 2014-08-10 MED ORDER — ATORVASTATIN CALCIUM 20 MG PO TABS
20.0000 mg | ORAL_TABLET | Freq: Every day | ORAL | Status: DC
  Administered 2014-08-10 – 2014-08-11 (×2): 20 mg via ORAL
  Filled 2014-08-10 (×2): qty 1

## 2014-08-10 MED ORDER — HEPARIN (PORCINE) IN NACL 100-0.45 UNIT/ML-% IJ SOLN
1200.0000 [IU]/h | INTRAMUSCULAR | Status: DC
  Administered 2014-08-10: 1200 [IU]/h via INTRAVENOUS
  Filled 2014-08-10 (×3): qty 250

## 2014-08-10 MED ORDER — ONDANSETRON HCL 4 MG/2ML IJ SOLN: 4.0000 mg | Freq: Four times a day (QID) | INTRAMUSCULAR | Status: DC | PRN

## 2014-08-10 MED ORDER — ALBUTEROL SULFATE (2.5 MG/3ML) 0.083% IN NEBU: 3.0000 mL | INHALATION_SOLUTION | Freq: Four times a day (QID) | RESPIRATORY_TRACT | Status: DC | PRN

## 2014-08-10 MED ORDER — VANCOMYCIN HCL IN DEXTROSE 1-5 GM/200ML-% IV SOLN: 1000.0000 mg | Freq: Once | INTRAVENOUS | Status: DC

## 2014-08-10 MED ORDER — ACETAMINOPHEN 325 MG PO TABS
650.0000 mg | ORAL_TABLET | Freq: Four times a day (QID) | ORAL | Status: DC | PRN
  Administered 2014-08-10: 650 mg via ORAL
  Filled 2014-08-10: qty 2

## 2014-08-10 MED ORDER — ACETAMINOPHEN 650 MG RE SUPP: 650.0000 mg | Freq: Four times a day (QID) | RECTAL | Status: DC | PRN

## 2014-08-10 MED ORDER — MONTELUKAST SODIUM 10 MG PO TABS
10.0000 mg | ORAL_TABLET | Freq: Every day | ORAL | Status: DC
  Administered 2014-08-10: 10 mg via ORAL
  Filled 2014-08-10: qty 1

## 2014-08-10 MED ORDER — ASPIRIN EC 81 MG PO TBEC: 81.0000 mg | DELAYED_RELEASE_TABLET | Freq: Every day | ORAL | Status: DC

## 2014-08-10 MED ORDER — VANCOMYCIN HCL 10 G IV SOLR
1250.0000 mg | INTRAVENOUS | Status: DC
  Administered 2014-08-10 – 2014-08-11 (×2): 1250 mg via INTRAVENOUS
  Filled 2014-08-10 (×4): qty 1250

## 2014-08-10 MED ORDER — HEPARIN BOLUS VIA INFUSION
4000.0000 [IU] | Freq: Once | INTRAVENOUS | Status: AC
  Administered 2014-08-10: 4000 [IU] via INTRAVENOUS
  Filled 2014-08-10 (×3): qty 4000

## 2014-08-10 MED ORDER — DABIGATRAN ETEXILATE MESYLATE 75 MG PO CAPS
150.0000 mg | ORAL_CAPSULE | Freq: Two times a day (BID) | ORAL | Status: DC
  Administered 2014-08-10 – 2014-08-11 (×3): 150 mg via ORAL
  Filled 2014-08-10 (×3): qty 2

## 2014-08-10 MED ORDER — LEFLUNOMIDE 20 MG PO TABS
20.0000 mg | ORAL_TABLET | Freq: Every day | ORAL | Status: DC
  Administered 2014-08-10: 20 mg via ORAL
  Filled 2014-08-10 (×2): qty 1

## 2014-08-10 MED ORDER — HYDROXYCHLOROQUINE SULFATE 200 MG PO TABS
200.0000 mg | ORAL_TABLET | Freq: Two times a day (BID) | ORAL | Status: DC
  Administered 2014-08-10 – 2014-08-11 (×3): 200 mg via ORAL
  Filled 2014-08-10 (×3): qty 1

## 2014-08-10 MED ORDER — PIPERACILLIN-TAZOBACTAM 3.375 G IVPB
3.3750 g | Freq: Three times a day (TID) | INTRAVENOUS | Status: DC
  Administered 2014-08-10: 3.375 g via INTRAVENOUS
  Filled 2014-08-10 (×5): qty 50

## 2014-08-10 MED ORDER — OXYCODONE HCL 5 MG PO TABS
5.0000 mg | ORAL_TABLET | ORAL | Status: DC | PRN
  Administered 2014-08-10: 5 mg via ORAL
  Filled 2014-08-10: qty 1

## 2014-08-10 MED ORDER — ASPIRIN 81 MG PO CHEW
81.0000 mg | CHEWABLE_TABLET | Freq: Every day | ORAL | Status: DC
  Administered 2014-08-10 – 2014-08-11 (×2): 81 mg via ORAL
  Filled 2014-08-10 (×2): qty 1

## 2014-08-10 MED ORDER — PREDNISONE 20 MG PO TABS
20.0000 mg | ORAL_TABLET | Freq: Every day | ORAL | Status: DC
  Administered 2014-08-10 – 2014-08-11 (×2): 20 mg via ORAL
  Filled 2014-08-10 (×2): qty 1

## 2014-08-10 MED ORDER — PIPERACILLIN-TAZOBACTAM 4.5 G IVPB
4.5000 g | Freq: Three times a day (TID) | INTRAVENOUS | Status: DC
  Administered 2014-08-10 – 2014-08-11 (×3): 4.5 g via INTRAVENOUS
  Filled 2014-08-10 (×7): qty 100

## 2014-08-10 MED ORDER — METOPROLOL SUCCINATE ER 50 MG PO TB24
50.0000 mg | ORAL_TABLET | Freq: Every day | ORAL | Status: DC
  Administered 2014-08-10 – 2014-08-11 (×2): 50 mg via ORAL
  Filled 2014-08-10 (×2): qty 1

## 2014-08-10 MED ORDER — ONDANSETRON HCL 4 MG PO TABS
4.0000 mg | ORAL_TABLET | Freq: Four times a day (QID) | ORAL | Status: DC | PRN
  Administered 2014-08-10: 4 mg via ORAL
  Filled 2014-08-10: qty 1

## 2014-08-10 MED ORDER — GABAPENTIN 300 MG PO CAPS
300.0000 mg | ORAL_CAPSULE | Freq: Three times a day (TID) | ORAL | Status: DC
  Administered 2014-08-10 – 2014-08-11 (×4): 300 mg via ORAL
  Filled 2014-08-10 (×4): qty 1

## 2014-08-10 MED ORDER — BUDESONIDE-FORMOTEROL FUMARATE 160-4.5 MCG/ACT IN AERO
2.0000 | INHALATION_SPRAY | Freq: Two times a day (BID) | RESPIRATORY_TRACT | Status: DC
  Administered 2014-08-10 – 2014-08-11 (×2): 2 via RESPIRATORY_TRACT
  Filled 2014-08-10: qty 6

## 2014-08-10 MED ORDER — SENNOSIDES-DOCUSATE SODIUM 8.6-50 MG PO TABS: 1.0000 | ORAL_TABLET | Freq: Every evening | ORAL | Status: DC | PRN

## 2014-08-10 MED ORDER — ZOLPIDEM TARTRATE 5 MG PO TABS
5.0000 mg | ORAL_TABLET | Freq: Every evening | ORAL | Status: DC | PRN
  Administered 2014-08-10: 5 mg via ORAL
  Filled 2014-08-10: qty 1

## 2014-08-10 MED ORDER — MORPHINE SULFATE 2 MG/ML IJ SOLN
2.0000 mg | INTRAMUSCULAR | Status: DC | PRN
  Administered 2014-08-10 (×3): 2 mg via INTRAVENOUS
  Filled 2014-08-10 (×3): qty 1

## 2014-08-10 MED ORDER — SODIUM CHLORIDE 0.9 % IJ SOLN
3.0000 mL | Freq: Two times a day (BID) | INTRAMUSCULAR | Status: DC
  Administered 2014-08-10 (×2): 3 mL via INTRAVENOUS

## 2014-08-10 NOTE — Progress Notes (Signed)
ANTIBIOTIC CONSULT NOTE - INITIAL  Pharmacy Consult for Vancomycin/Zosyn Indication: R/O orbital cellulitis  No Known Allergies  Patient Measurements: Height: 5\' 4"  (162.6 cm) Weight: (!) 335 lb 9.6 oz (152.227 kg) IBW/kg (Calculated) : 54.7 Adjusted Body Weight: 93.7 kg  Vital Signs: Temp: 98.2 F (36.8 C) (06/13 0115) Temp Source: Oral (06/13 0115) BP: 148/62 mmHg (06/13 0115) Pulse Rate: 73 (06/13 0115) Intake/Output from previous day:   Intake/Output from this shift:    Labs:  Recent Labs  08/09/14 2016  WBC 8.6  HGB 11.1*  PLT 153  CREATININE 1.37*   Estimated Creatinine Clearance: 61.4 mL/min (by C-G formula based on Cr of 1.37). No results for input(s): VANCOTROUGH, VANCOPEAK, VANCORANDOM, GENTTROUGH, GENTPEAK, GENTRANDOM, TOBRATROUGH, TOBRAPEAK, TOBRARND, AMIKACINPEAK, AMIKACINTROU, AMIKACIN in the last 72 hours.   Kinetics:   Ke: 0.055 Vd: 65.6   Microbiology: No results found for this or any previous visit (from the past 720 hour(s)).  Medical History: Past Medical History  Diagnosis Date  . Hypertension   . Dyslipidemia   . Arrhythmia     paroxysmal atrial fibrillation  . CHF (congestive heart failure)     chronic diastolic  . Morbid obesity   . Hypokalemia   . Coronary artery disease     nonobstructive CAD  . Asthma   . Rheumatoid arthritis(714.0)   . Basal cell carcinoma   . Hyperplastic colonic polyp 2003  . History of gout   . COPD (chronic obstructive pulmonary disease)   . Atrial fibrillation     Medications:  Scheduled:  . atorvastatin  20 mg Oral Daily  . budesonide-formoterol  2 puff Inhalation BID  . gabapentin  300 mg Oral TID  . hydroxychloroquine  200 mg Oral BID  . leflunomide  20 mg Oral Daily  . metoprolol succinate  50 mg Oral Daily  . montelukast  10 mg Oral QHS  . piperacillin-tazobactam (ZOSYN)  IV  3.375 g Intravenous Once  . piperacillin-tazobactam (ZOSYN)  IV  3.375 g Intravenous Once  .  piperacillin-tazobactam (ZOSYN)  IV  3.375 g Intravenous 3 times per day  . sodium chloride  3 mL Intravenous Q12H  . vancomycin  1,250 mg Intravenous Q18H  . vancomycin  1,000 mg Intravenous Once   Infusions:  . sodium chloride 75 mL/hr at 08/10/14 0211  . heparin 1,200 Units/hr (08/10/14 0217)   PRN: acetaminophen **OR** acetaminophen, albuterol, ondansetron **OR** ondansetron (ZOFRAN) IV, senna-docusate Anti-infectives    Start     Dose/Rate Route Frequency Ordered Stop   08/10/14 1000  hydroxychloroquine (PLAQUENIL) tablet 200 mg     200 mg Oral 2 times daily 08/10/14 0200     08/10/14 0700  vancomycin (VANCOCIN) 1,250 mg in sodium chloride 0.9 % 250 mL IVPB     1,250 mg 166.7 mL/hr over 90 Minutes Intravenous Every 18 hours 08/10/14 0233     08/10/14 0500  piperacillin-tazobactam (ZOSYN) IVPB 3.375 g     3.375 g 12.5 mL/hr over 240 Minutes Intravenous 3 times per day 08/10/14 0233     08/09/14 2200  vancomycin (VANCOCIN) IVPB 1000 mg/200 mL premix     1,000 mg 200 mL/hr over 60 Minutes Intravenous  Once 08/10/14 0200     08/09/14 2200  piperacillin-tazobactam (ZOSYN) IVPB 3.375 g     3.375 g 12.5 mL/hr over 240 Minutes Intravenous  Once 08/10/14 0200     08/09/14 2145  vancomycin (VANCOCIN) IVPB 1000 mg/200 mL premix     1,000 mg 200 mL/hr  over 60 Minutes Intravenous  Once 08/09/14 2134 08/09/14 2309   08/09/14 2145  piperacillin-tazobactam (ZOSYN) IVPB 3.375 g     3.375 g 12.5 mL/hr over 240 Minutes Intravenous  Once 08/09/14 2134       Assessment: Patient ordered empiric abx for possible orbital cellulitis of right eye.   Goal of Therapy:  Vancomycin trough level 10-15 mcg/ml  Plan:  1. Vancomycin 1000 mg iv once given in ED. Will order vancomycin 1250 mg iv q 18 h stacked dosing and a trough with the 4th dose.   2. Zosyn 3.375 g iv once then 3.375 g EI q 8 h.   Ulice Dash D 08/10/2014,2:36 AM

## 2014-08-10 NOTE — Consult Note (Signed)
Cardiology Consultation Note  Patient ID: Tina Pacheco, MRN: [id number], DOB/AGE: Aug 07, 1950 64 y.o. Admit date: 08/09/2014   Date of Consult: 08/10/2014 Primary Physician: Arnette Norris, MD Primary Cardiologist: Dr. Rockey Situ, MD  Chief Complaint: Generalized malaise, fatigue, nausea, vomiting, and right eye conjunctivitis x 3-4 days  Reason for Consult: Elevated troponin  HPI: 64 y.o. female with h/o nonobstructive CAD by cardiac cath on May 24, 2010, PAF on Pradaxa, diastolic dysfunction,  HLD, HTN, asthma, COPD, suspected sleep apnea, morbid obesity, rheumatoid arthritis, and chronic back pain who presented to [facility] on 6/12 with a 3-4 day history of generalized malaise, fatigue, nausea, vomiting, and right eye conjunctivitis.   She has known nonobstructive CAD by cardiac cath during hospital admission to [facility] during March 2012. She has not had any further ischemic evaluations since that time. She has felt well since then, without anginal symptoms. Echo in 2012 showed normal LV function with diastolic dysfunction. She has known episodes of PAF documented in March 2012, July 2012, and October 2013. She has been on longstanding Pradaxa and tolerating this well. At her last follow up 05/2014 she denied any recent episodes concerning for tachycardia/a-fib. At that time she did have a cough x 3 weeks that was characterized as dry. She felt this was 2/2 her allergies. She also noted significant fatigue at that time as well. She does not exercise on a regular basis 2/2 her RA and chronic back pain. She takes torsemide on a prn basis. Weight has been slowly climbing, though she does not note increased PND, orthopnea, or lower extremity edema.   Over the past 3-4 days she has noted generalized malaise, fatigue, nausea, vomiting, and right eye conjunctivitis. She stopped taking all of her medications on Friday, June 10th secondary to her nausea and vomiting and had not taken any po medications prior to her  arrival to [facility] on 6/12. She had even stopped drinking po liquids 2/2 her nausea and vomiting. She denied any chest pain, shoulder pain, SOB, palpitations, diaphoresis, edema, presyncope, or syncope.   She noted right eye conjunctivitis with matting of the eye in the morning. The left eye is asymptomatic.   Upon her arrival to [facility] she was found to have a troponin of 0.34-->0.35. EKG with NSR, 90 bpm, no significant st/t changes. SCr 1.37 (baseline 0.81). BUN 35. Blood cultures negative x 2 at 12 hours. WBC 8.6. Hgb 11.1. She was restarted on her Pradaxa. She was also started on vancomycin and Zosyn. Her nausea and vomiting have improved. She wants po liquids at this time. Echo is pending.       Past Medical History  Diagnosis Date  . Hypertension   . Dyslipidemia   . PAF (paroxysmal atrial fibrillation)     a. on Pradaxa  . Diastolic dysfunction   . Morbid obesity   . Hypokalemia   . History of cardiac cath     a. 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 Asthma   . Rheumatoid arthritis(714.0)   . Basal cell carcinoma   . Hyperplastic colonic polyp 2003  . History of gout   . COPD (chronic obstructive pulmonary disease)       Most Recent Cardiac Studies: Cardiac cath 05/24/2010:  CORONARY ANGIOGRAPHY: The right coronary artery is patent. There are minor luminal irregularities throughout the proximal and mid vessel. There is no high-grade stenosis present. Distally, the vessel divides into a PDA and posterolateral branch. There is no significant stenosis throughout the course  of the right coronary artery.  Left mainstem: The left main is widely patent. There is no significant obstructive disease. The vessel divides into the LAD and left circumflex.  Left circumflex: The left circumflex is a large vessel that gives off two obtuse marginal branches. There is no stenosis throughout the territory of the left circumflex.  LAD: The LAD is also a large vessel  that courses to the LV apex. The caliber of the LAD decreases after the first diagonal branch. The vessel has 30% ostial stenosis and 30-40% mid stenosis, and the diagonal branches are widely patent. There is no high-grade stenosis throughout the course of the LAD.  Left ventriculography shows normal LV function. Ejection fraction is estimated at 60%. There is no significant mitral regurgitation.  FINAL ASSESSMENT: 1. Mild nonobstructive coronary artery disease as described above. 2. Normal left ventricular systolic function.  RECOMMENDATIONS: The patient will be started on Coumadin for atrial fibrillation. If her rhythm and electrolytes are stable in the morning, she will likely be able to be discharged.    Echo 04/2010:  EF 60-65%, no RWMA, G2DD, aortic valve sclerosis without stenosis, mitral valve moderately calcified annulus with trivial regurgitation, left atrium moderately dilated, PASP 34 mm Hg   Surgical History:  Past Surgical History  Procedure Laterality Date  . Cardiac catheterization  05/24/2010    nonobstructive CAD  . Knee arthroscopy      bilateral  . Vaginal hysterectomy    . Cholecystectomy    . Cesarean section    . Colonoscopy  06/12/2011    Procedure: COLONOSCOPY;  Surgeon: Juanita Craver, MD;  Location: [facility]OSCOPY;  Service: Endoscopy;  Laterality: N/A;  . Colonoscopy N/A 03/17/2013    Procedure: COLONOSCOPY;  Surgeon: Juanita Craver, MD;  Location: [facility] ENDOSCOPY;  Service: Endoscopy;  Laterality: N/A;     Home Meds: Prior to Admission medications   Medication Sig Start Date End Date Taking? Authorizing Provider  atorvastatin (LIPITOR) 20 MG tablet Take 1 tablet (20 mg total) by mouth daily. 07/22/13  Yes Lucille Passy, MD  dabigatran (PRADAXA) 150 MG CAPS capsule Take 1 capsule (150 mg total) by mouth 2 (two) times daily. 05/23/13  Yes Minna Merritts, MD  Febuxostat (ULORIC PO) 40 mg daily. Take one by mouth daily, pt unsure of strength   Yes  Historical Provider, MD  fluticasone (FLONASE) 50 MCG/ACT nasal spray Place 2 sprays into the nose daily as needed. For allergies    Yes Historical Provider, MD  gabapentin (NEURONTIN) 300 MG capsule Take 1 capsule (300 mg total) by mouth 3 (three) times daily. Take 1 capsule 2 to 3 hours prior to bedtime. 08/03/14  Yes Lucille Passy, MD  hydroxychloroquine (PLAQUENIL) 200 MG tablet Take by mouth 2 (two) times daily.   Yes Historical Provider, MD  KLOR-CON M20 20 MEQ tablet TAKE 1/2 TABLET (=10MEQ)   TWICE A DAY; MAY TAKE AN   EXTRA TABLET WHEN TAKING   TORSEMIDE 07/28/14  Yes Minna Merritts, MD  leflunomide (ARAVA) 20 MG tablet Take 20 mg by mouth daily. 09/20/11  Yes Historical Provider, MD  lisinopril (PRINIVIL,ZESTRIL) 10 MG tablet take 1 tablet by mouth once daily 03/17/14  Yes Lucille Passy, MD  loratadine (CLARITIN) 10 MG tablet Take 10 mg by mouth daily.   Yes Historical Provider, MD  metoprolol succinate (TOPROL-XL) 50 MG 24 hr tablet take 1 tablet by mouth once daily 07/13/14  Yes Minna Merritts, MD  montelukast (SINGULAIR) 10 MG  tablet Take 1 tablet (10 mg total) by mouth at bedtime. 06/29/14  Yes Lucille Passy, MD  pregabalin (LYRICA) 300 MG capsule Take 1 capsule (300 mg total) by mouth daily. Patient taking differently: Take 150 mg by mouth 2 (two) times daily.  04/16/13  Yes Lucille Passy, MD  terazosin (HYTRIN) 10 MG capsule take 1 capsule by mouth at bedtime 05/15/14  Yes Lucille Passy, MD  torsemide (DEMADEX) 20 MG tablet Take 1 tablet (20 mg total) by mouth daily as needed. Edema 08/03/14  Yes Lucille Passy, MD  albuterol (PROVENTIL HFA;VENTOLIN HFA) 108 (90 BASE) MCG/ACT inhaler Inhale 2 puffs into the lungs every 6 (six) hours as needed. For shortness of breath 03/14/12   Lucille Passy, MD  budesonide-formoterol [facility]) 160-4.5 MCG/ACT inhaler Inhale 2 puffs into the lungs 2 (two) times daily as needed. For shortness of breath 03/14/12   Lucille Passy, MD  Multiple Vitamin (MULTIVITAMIN)  tablet Take 1 tablet by mouth daily.      Historical Provider, MD  nystatin (MYCOSTATIN/NYSTOP) 100000 UNIT/GM POWD Apply to area three times daily as needed for rash 06/20/12   Lucille Passy, MD  oxyCODONE (ROXICODONE) 5 MG immediate release tablet 2 tablets every 12 hours 08/03/14   Lucille Passy, MD  Specialty Vitamins Products (MAGNESIUM, AMINO ACID CHELATE,) 133 MG tablet Take 1 tablet by mouth at bedtime.     Historical Provider, MD  SUMAtriptan (IMITREX) 25 MG tablet Take 1 tablet (25 mg total) by mouth every 2 (two) hours as needed for migraine or headache. May repeat in 2 hours if headache persists or recurs. 04/08/14   Lucille Passy, MD    Inpatient Medications:  . atorvastatin  20 mg Oral Daily  . budesonide-formoterol  2 puff Inhalation BID  . dabigatran  150 mg Oral Q12H  . gabapentin  300 mg Oral TID  . hydroxychloroquine  200 mg Oral BID  . leflunomide  20 mg Oral Daily  . metoprolol succinate  50 mg Oral Daily  . montelukast  10 mg Oral QHS  . piperacillin-tazobactam (ZOSYN)  IV  3.375 g Intravenous Once  . piperacillin-tazobactam (ZOSYN)  IV  3.375 g Intravenous 3 times per day  . predniSONE  20 mg Oral Q breakfast  . sodium chloride  3 mL Intravenous Q12H  . vancomycin  1,250 mg Intravenous Q18H  . vancomycin  1,000 mg Intravenous Once      Allergies: No Known Allergies  History   Social History  . Marital Status: Married    Spouse Name: N/A  . Number of Children: N/A  . Years of Education: N/A   Occupational History  . Not on file.   Social History Main Topics  . Smoking status: Never Smoker   . Smokeless tobacco: Never Used  . Alcohol Use: No  . Drug Use: No  . Sexual Activity: Not on file   Other Topics Concern  . Not on file   Social History Narrative     Family History  Problem Relation Age of Onset  . Tuberculosis Mother   . Parkinsonism Mother   . Diabetes type II Sister   . Breast cancer Sister   . Breast cancer Maternal Aunt      Review  of Systems: Review of Systems  Constitutional: Positive for fever, chills and malaise/fatigue. Negative for weight loss and diaphoresis.  HENT: Positive for ear pain. Negative for congestion, ear discharge, hearing loss, nosebleeds, sore throat and  tinnitus.   Eyes: Positive for blurred vision, pain, discharge and redness. Negative for double vision and photophobia.  Respiratory: Negative for cough, hemoptysis, sputum production, shortness of breath, wheezing and stridor.   Cardiovascular: Negative for chest pain, palpitations, orthopnea, claudication, leg swelling and PND.  Gastrointestinal: Positive for nausea and vomiting. Negative for heartburn, abdominal pain, diarrhea and constipation.  Genitourinary: Negative for dysuria and hematuria.  Musculoskeletal: Positive for myalgias, back pain, joint pain, falls and neck pain.  Skin: Negative for itching and rash.  Neurological: Positive for dizziness and weakness. Negative for speech change, focal weakness, loss of consciousness and headaches.  Psychiatric/Behavioral: Negative for depression and hallucinations. The patient is not nervous/anxious.   All other systems reviewed and are negative.   Labs:  Recent Labs  08/09/14 2016 08/10/14 0516 08/10/14 0818  TROPONINI 0.34* 0.35* 0.79*   Lab Results  Component Value Date   WBC 7.5 08/10/2014   HGB 10.8* 08/10/2014   HCT 33.8* 08/10/2014   MCV 85.2 08/10/2014   PLT 146* 08/10/2014     Recent Labs Lab 08/09/14 2016 08/10/14 0818  NA 138 141  K 4.3 4.2  CL 106 109  CO2 24 23  BUN 35* 31*  CREATININE 1.37* 1.19*  CALCIUM 8.9 8.2*  PROT 6.8  --   BILITOT 1.5*  --   ALKPHOS 75  --   ALT 16  --   AST 31  --   GLUCOSE 118* 115*   Lab Results  Component Value Date   CHOL 114 09/16/2013   HDL 43.50 09/16/2013   LDLCALC 55 09/16/2013   TRIG 78.0 09/16/2013   No results found for: DDIMER  Radiology/Studies:  Dg Chest Portable 1 View  08/09/2014   CLINICAL DATA:  Poor  appetite for 3 days. Status post fall while trying to go to bathroom. Weakness and nausea. Initial encounter.  EXAM: PORTABLE CHEST - 1 VIEW  COMPARISON:  Chest radiograph performed 08/30/2011  FINDINGS: The lungs are well-aerated. Vascular congestion is noted, with increased interstitial markings, concerning for mild interstitial edema. There is no evidence of pleural effusion or pneumothorax.  The cardiomediastinal silhouette is mildly enlarged. No acute osseous abnormalities are seen.  IMPRESSION: Vascular congestion and mild cardiomegaly, with increased interstitial markings, concerning for mild interstitial edema. No definite displaced rib fracture seen.   Electronically Signed   By: Garald Balding M.D.   On: 08/09/2014 21:05    EKG: NSR, 90 bpm, no significant st/t changes   Weights: Filed Weights   08/09/14 1936 08/10/14 0100  Weight: 325 lb (147.419 kg) 335 lb 9.6 oz (152.227 kg)     Physical Exam: Blood pressure 177/72, pulse 80, temperature 98.9 F (37.2 C), temperature source Oral, resp. rate 21, height 5\' 4"  (1.626 m), weight 335 lb 9.6 oz (152.227 kg), SpO2 97 %. Body mass index is 57.58 kg/(m^2). General: Well developed, well nourished, in no acute distress. Head: Normocephalic, atraumatic, sclera non-icteric, no xanthomas, right eye swollwn and injected, nares are without discharge.  Neck: Negative for carotid bruits. JVD not elevated. Lungs: Clear bilaterally to auscultation without wheezes, rales, or rhonchi. Breathing is unlabored. Heart: RRR with S1 S2. No murmurs, rubs, or gallops appreciated. Abdomen: Morbidly obese, soft, non-tender, non-distended with normoactive bowel sounds. No hepatomegaly. No rebound/guarding. No obvious abdominal masses. Msk:  Strength and tone appear normal for age. Extremities: No clubbing or cyanosis. No edema.   Neuro: Alert and oriented X 3. No facial asymmetry. No focal deficit. Moves all extremities  spontaneously. Psych:  Responds to  questions appropriately with a normal affect.    Assessment and Plan:  64 y.o. 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  1. Elevated troponin: -Troponin mildly elevated with a plateau 0/34-->0.35 -Likely supply demand ischemia in the setting of her nausea, vomiting, and eye infection  -Echo is pending to evaluate LV function and and wall motion  -Recommend continued supportive care for her nausea vomiting, which seems to be improving  -Stop heparin gtt -Can plan for 2 day nuclear stress test as an outpatient when she is feeling better (will need to be a Lexiscan as she is unable to treadmill 2/2 her RA and chronic back and knee pain)  2. Nausea and vomiting: -Generalized malaise and fatigue likely 2/2 her 3-4 days of emesis and poor po intake  -Slow reintroduction of po intake  -Improving   3. Right eye cellulitis/conjunctival hemorrhage: -On IV vancomycin and Zosyn per IM -Will follow up with ophthalmology as outpatient   4. PAF: -In sinus rhythm currently -On Pradaxa -Continue Toprol Xl 50 mg   5. RA/chronic pain: -Complains of back pain -This is not unusual for her -PRN morphine has been added   6. HTN: -Continue home medications -BP is slightly elevated this morning -If this continues to be elevated would add in prn hydralazine   7. HLD: -Continue Lipitor  8. Morbid obesity: -Weight loss is key -Could consider swimming as outpatient    Signed, Christell Faith, PA-C Pager: [phone] 08/10/2014, 9:30 AM

## 2014-08-10 NOTE — Progress Notes (Signed)
Patient ID: Tina Pacheco, female   DOB: 21-Apr-1950, 64 y.o.   MRN: [id number] [facility] PROGRESS NOTE  PCP: Arnette Norris, MD  HPI/Subjective: Patient feels miserable. Her back hurts. Her eye itches and it swollen where she can't open it. When I opened her eyes she does not have any problems with her vision she can see for away normally she wears glasses for reading and does not have her glasses in and she took her contacts out last week. She wants something to drink. No complaints of chest pain. Some shortness of breath while lying flat. She does have pain in her back and her knees and had a recent fall yesterday.  Objective: Filed Vitals:   08/10/14 0459  BP: 177/72  Pulse: 80  Temp: 98.9 F (37.2 C)  Resp: 21    Intake/Output Summary (Last 24 hours) at 08/10/14 0857 Last data filed at 08/10/14 0611  Gross per 24 hour  Intake  346.8 ml  Output   1000 ml  Net -653.2 ml   Filed Weights   08/09/14 1936 08/10/14 0100  Weight: 147.419 kg (325 lb) 152.227 kg (335 lb 9.6 oz)    ROS: Review of Systems  Constitutional: Negative for fever and chills.  Eyes: Positive for discharge and redness. Negative for blurred vision, double vision, photophobia and pain.  Respiratory: Positive for shortness of breath. Negative for cough.   Cardiovascular: Negative for chest pain.  Gastrointestinal: Negative for nausea, vomiting, abdominal pain, diarrhea and constipation.  Genitourinary: Negative for dysuria.  Musculoskeletal: Positive for back pain and joint pain.  Neurological: Negative for dizziness and headaches.   Exam: Physical Exam  HENT:  Nose: No mucosal edema.  Mouth/Throat: No oropharyngeal exudate or posterior oropharyngeal edema.  Eyes: EOM are normal. Pupils are equal, round, and reactive to light.  Right eye conjunctiva hemorrhage. Right eyelid swollen shut erythematous and some crusting. Vision up close 20/70 without reading glasses right eye. distance vision no  problems.  Neck: No JVD present. Carotid bruit is not present. No edema present. No thyroid mass and no thyromegaly present.  Cardiovascular: S1 normal and S2 normal.  Exam reveals no gallop.   No murmur heard. Pulses:      Dorsalis pedis pulses are 1+ on the right side, and 1+ on the left side.  Respiratory: No respiratory distress. She has no wheezes. She has no rhonchi. She has no rales.  GI: Soft. Bowel sounds are normal. There is no tenderness.  Musculoskeletal:       Right ankle: She exhibits swelling.       Left ankle: She exhibits swelling.  Lymphadenopathy:    She has no cervical adenopathy.  Neurological: She is alert. No cranial nerve deficit.  Skin: Skin is warm. No rash noted. Nails show no clubbing.  Psychiatric: She has a normal mood and affect.    Data Reviewed: Basic Metabolic Panel:  Recent Labs Lab 08/09/14 2016  NA 138  K 4.3  CL 106  CO2 24  GLUCOSE 118*  BUN 35*  CREATININE 1.37*  CALCIUM 8.9   Liver Function Tests:  Recent Labs Lab 08/09/14 2016  AST 31  ALT 16  ALKPHOS 75  BILITOT 1.5*  PROT 6.8  ALBUMIN 3.5   CBC:  Recent Labs Lab 08/09/14 2016 08/10/14 0818  WBC 8.6 7.5  NEUTROABS 7.3*  --   HGB 11.1* 10.8*  HCT 33.2* 33.8*  MCV 84.5 85.2  PLT 153 146*   Cardiac Enzymes:  Recent  Labs Lab 08/09/14 2016 08/10/14 0516  TROPONINI 0.34* 0.35*    Dg Chest Portable 1 View  08/09/2014   CLINICAL DATA:  Poor appetite for 3 days. Status post fall while trying to go to bathroom. Weakness and nausea. Initial encounter.  EXAM: PORTABLE CHEST - 1 VIEW  COMPARISON:  Chest radiograph performed 08/30/2011  FINDINGS: The lungs are well-aerated. Vascular congestion is noted, with increased interstitial markings, concerning for mild interstitial edema. There is no evidence of pleural effusion or pneumothorax.  The cardiomediastinal silhouette is mildly enlarged. No acute osseous abnormalities are seen.  IMPRESSION: Vascular congestion and mild  cardiomegaly, with increased interstitial markings, concerning for mild interstitial edema. No definite displaced rib fracture seen.   Electronically Signed   By: Garald Balding M.D.   On: 08/09/2014 21:05    Scheduled Meds: . atorvastatin  20 mg Oral Daily  . budesonide-formoterol  2 puff Inhalation BID  . dabigatran  150 mg Oral Q12H  . gabapentin  300 mg Oral TID  . hydroxychloroquine  200 mg Oral BID  . leflunomide  20 mg Oral Daily  . metoprolol succinate  50 mg Oral Daily  . montelukast  10 mg Oral QHS  . piperacillin-tazobactam (ZOSYN)  IV  3.375 g Intravenous Once  . piperacillin-tazobactam (ZOSYN)  IV  3.375 g Intravenous 3 times per day  . predniSONE  20 mg Oral Q breakfast  . sodium chloride  3 mL Intravenous Q12H  . vancomycin  1,250 mg Intravenous Q18H  . vancomycin  1,000 mg Intravenous Once   Continuous Infusions: . heparin 1,200 Units/hr (08/10/14 0217)    Assessment/Plan:  1. Right eyelid cellulitis, right conjunctiva hemorrhage. I spoke with Dr. Dorita Sciara ophthalmology he would rather follow up as outpatient. I think this is okay to do. I will continue IV antibotics vancomycin and Zosyn at this time. 2. Rheumatoid arthritis with joint pain- I will start low-dose prednisone and continue her other medications for rheumatoid arthritis. Patient asking for some morphine at this point. 3. Elevated troponin likely demand ischemia stop heparin drip and put back on pradaxa. 4. Essential hypertension continue usual medications. 5. Hyperlipidemia unspecified continue atorvastatin 6. Obesity weight loss needed.  Code Status:     Code Status Orders        Start     Ordered   08/10/14 0200  Full code   Continuous     08/10/14 0200     Family Communication: Family at bedside Disposition Plan: Home once better  Consultants:  Cardiology Dr. Fletcher Anon  Time spent: 35 minutes  Loletha Grayer  [facility]

## 2014-08-10 NOTE — Progress Notes (Signed)
Notified Dr. Reece Levy of BP of 177/72. ORdered  To give Toprol early/now.

## 2014-08-10 NOTE — Progress Notes (Signed)
Pain noted    0/10.   

## 2014-08-10 NOTE — Progress Notes (Signed)
ANTICOAGULATION CONSULT NOTE - Initial Consult  Pharmacy Consult for Heparin Indication: chest pain/ACS  No Known Allergies  Patient Measurements: Height: 5\' 4"  (162.6 cm) Weight: (!) 335 lb 9.6 oz (152.227 kg) IBW/kg (Calculated) : 54.7 Heparin Dosing Weight: 92.1 kg  Vital Signs: Temp: 98.2 F (36.8 C) (06/13 0115) Temp Source: Oral (06/13 0115) BP: 148/62 mmHg (06/13 0115) Pulse Rate: 73 (06/13 0115)  Labs:  Recent Labs  08/09/14 2016  HGB 11.1*  HCT 33.2*  PLT 153  CREATININE 1.37*  TROPONINI 0.34*    Estimated Creatinine Clearance: 61.4 mL/min (by C-G formula based on Cr of 1.37).   Medical History: Past Medical History  Diagnosis Date  . Hypertension   . Dyslipidemia   . Arrhythmia     paroxysmal atrial fibrillation  . CHF (congestive heart failure)     chronic diastolic  . Morbid obesity   . Hypokalemia   . Coronary artery disease     nonobstructive CAD  . Asthma   . Rheumatoid arthritis(714.0)   . Basal cell carcinoma   . Hyperplastic colonic polyp 2003  . History of gout   . COPD (chronic obstructive pulmonary disease)   . Atrial fibrillation     Medications:  Scheduled:  . atorvastatin  20 mg Oral Daily  . budesonide-formoterol  2 puff Inhalation BID  . gabapentin  300 mg Oral TID  . hydroxychloroquine  200 mg Oral BID  . leflunomide  20 mg Oral Daily  . metoprolol succinate  50 mg Oral Daily  . montelukast  10 mg Oral QHS  . piperacillin-tazobactam (ZOSYN)  IV  3.375 g Intravenous Once  . piperacillin-tazobactam (ZOSYN)  IV  3.375 g Intravenous Once  . piperacillin-tazobactam (ZOSYN)  IV  3.375 g Intravenous 3 times per day  . sodium chloride  3 mL Intravenous Q12H  . vancomycin  1,250 mg Intravenous Q18H  . vancomycin  1,000 mg Intravenous Once   Infusions:  . sodium chloride 75 mL/hr at 08/10/14 0211  . heparin 1,200 Units/hr (08/10/14 0217)   PRN: acetaminophen **OR** acetaminophen, albuterol, ondansetron **OR** ondansetron  (ZOFRAN) IV, senna-docusate  Assessment: Patient with a h/o afib on Pradaxa PTA admitted with NSTEMI. Last dose of Pradaxa was Friday per patient.   Goal of Therapy:  Heparin level 0.3-0.7 units/ml Monitor platelets by anticoagulation protocol: Yes   Plan:  Will bolus heparin 4000 units iv once then begin infusion at 1200 units/hr. Will check a 6 h HL after starting infusion.   Ulice Dash D 08/10/2014,2:40 AM

## 2014-08-10 NOTE — Progress Notes (Signed)
PT Cancellation Note  Patient Details Name: Tina Pacheco MRN: [id number] DOB: [birth year]   Cancelled Treatment:    Reason Eval/Treat Not Completed: Patient not medically ready;Medical issues which prohibited therapy. Patient continues to trend upwards with her troponins 0.34 --> 0.35 --> 0.79 --> 1.11, and does not appear to be appropriate for mobility evaluation at this time. PT will continue to re-attempt as patient is appropriate and available for mobility evaluation.    Royce Macadamia 08/10/2014, 3:29 PM

## 2014-08-10 NOTE — Progress Notes (Signed)
PT Cancellation Note  Patient Details Name: Tina Pacheco MRN: [id number] DOB: 05/02/1950   Cancelled Treatment:    Reason Eval/Treat Not Completed: Patient at procedure or test/unavailable. Patient currently at echocardiogram and not available for mobility assessment. PT will continue to follow and re-attempt when patient is available and appropriate.   Kerman Passey, PT, DPT   08/10/2014, 11:12 AM

## 2014-08-10 NOTE — Progress Notes (Signed)
Pt alert and oriented x4, no complaints of pain or discomfort.  Bed in low position, call bell within reach.  Bed alarms on and functioning.  Assessment done and charted.  Will continue to monitor and do hourly rounding throughout the shift 

## 2014-08-10 NOTE — Progress Notes (Signed)
*  PRELIMINARY RESULTS* Echocardiogram 2D Echocardiogram has been performed.  Tina Pacheco 08/10/2014, 11:24 AM

## 2014-08-10 NOTE — Progress Notes (Signed)
ANTIBIOTIC CONSULT NOTE - Follow up  Pharmacy Consult for Vancomycin/Zosyn Indication: R/O orbital cellulitis  No Known Allergies  Patient Measurements: Height: 5\' 4"  (162.6 cm) Weight: (!) 335 lb 9.6 oz (152.227 kg) IBW/kg (Calculated) : 54.7 Adjusted Body Weight: 93.7 kg  Vital Signs: Temp: 97.6 F (36.4 C) (06/13 1150) Temp Source: Oral (06/13 0459) BP: 145/63 mmHg (06/13 1150) Pulse Rate: 65 (06/13 1150) Intake/Output from previous day: 06/12 0701 - 06/13 0700 In: 346.8 [I.V.:346.8] Out: 1000 [Urine:1000] Intake/Output from this shift: Total I/O In: -  Out: 550 [Urine:550]  Labs:  Recent Labs  08/09/14 2016 08/10/14 0818  WBC 8.6 7.5  HGB 11.1* 10.8*  PLT 153 146*  CREATININE 1.37* 1.19*   Estimated Creatinine Clearance: 70.6 mL/min (by C-G formula based on Cr of 1.19). No results for input(s): VANCOTROUGH, VANCOPEAK, VANCORANDOM, GENTTROUGH, GENTPEAK, GENTRANDOM, TOBRATROUGH, TOBRAPEAK, TOBRARND, AMIKACINPEAK, AMIKACINTROU, AMIKACIN in the last 72 hours.   Kinetics:   Ke: 0.055 Vd: 65.6   Microbiology: Recent Results (from the past 720 hour(s))  Blood culture (routine x 2)     Status: None (Preliminary result)   Collection Time: 08/09/14  8:16 PM  Result Value Ref Range Status   Specimen Description BLOOD  Final   Special Requests NONE  Final   Culture NO GROWTH < 12 HOURS  Final   Report Status PENDING  Incomplete  Blood culture (routine x 2)     Status: None (Preliminary result)   Collection Time: 08/09/14 10:00 PM  Result Value Ref Range Status   Specimen Description BLOOD  Final   Special Requests NONE  Final   Culture NO GROWTH < 12 HOURS  Final   Report Status PENDING  Incomplete    Medical History: Past Medical History  Diagnosis Date  . Hypertension   . Dyslipidemia   . PAF (paroxysmal atrial fibrillation)     a. on Pradaxa  . Diastolic dysfunction   . Morbid obesity   . Hypokalemia   . History of cardiac cath     a. cardiac cath  05/24/2010 - nonobstructive CAD  . Asthma   . Rheumatoid arthritis(714.0)   . Basal cell carcinoma   . Hyperplastic colonic polyp 2003  . History of gout   . COPD (chronic obstructive pulmonary disease)     Medications:  Scheduled:  . atorvastatin  20 mg Oral Daily  . budesonide-formoterol  2 puff Inhalation BID  . dabigatran  150 mg Oral Q12H  . gabapentin  300 mg Oral TID  . hydroxychloroquine  200 mg Oral BID  . leflunomide  20 mg Oral Daily  . metoprolol succinate  50 mg Oral Daily  . montelukast  10 mg Oral QHS  . piperacillin-tazobactam (ZOSYN)  IV  3.375 g Intravenous Once  . piperacillin-tazobactam (ZOSYN)  IV  4.5 g Intravenous 3 times per day  . predniSONE  20 mg Oral Q breakfast  . sodium chloride  3 mL Intravenous Q12H  . vancomycin  1,250 mg Intravenous Q18H  . vancomycin  1,000 mg Intravenous Once   Infusions:    PRN: acetaminophen **OR** acetaminophen, albuterol, morphine injection, ondansetron **OR** ondansetron (ZOFRAN) IV, oxyCODONE, senna-docusate Anti-infectives    Start     Dose/Rate Route Frequency Ordered Stop   08/10/14 1400  piperacillin-tazobactam (ZOSYN) IVPB 4.5 g     4.5 g 200 mL/hr over 30 Minutes Intravenous 3 times per day 08/10/14 1335     08/10/14 1000  hydroxychloroquine (PLAQUENIL) tablet 200 mg  200 mg Oral 2 times daily 08/10/14 0200     08/10/14 0700  vancomycin (VANCOCIN) 1,250 mg in sodium chloride 0.9 % 250 mL IVPB     1,250 mg 166.7 mL/hr over 90 Minutes Intravenous Every 18 hours 08/10/14 0233     08/10/14 0500  piperacillin-tazobactam (ZOSYN) IVPB 3.375 g  Status:  Discontinued     3.375 g 12.5 mL/hr over 240 Minutes Intravenous 3 times per day 08/10/14 0233 08/10/14 1335   08/09/14 2200  vancomycin (VANCOCIN) IVPB 1000 mg/200 mL premix     1,000 mg 200 mL/hr over 60 Minutes Intravenous  Once 08/10/14 0200     08/09/14 2200  piperacillin-tazobactam (ZOSYN) IVPB 3.375 g     3.375 g 12.5 mL/hr over 240 Minutes Intravenous   Once 08/10/14 0200     08/09/14 2145  vancomycin (VANCOCIN) IVPB 1000 mg/200 mL premix     1,000 mg 200 mL/hr over 60 Minutes Intravenous  Once 08/09/14 2134 08/09/14 2309   08/09/14 2145  piperacillin-tazobactam (ZOSYN) IVPB 3.375 g     3.375 g 12.5 mL/hr over 240 Minutes Intravenous  Once 08/09/14 2134 08/10/14 5188     Assessment: Patient ordered empiric abx for possible orbital cellulitis of right eye.   Goal of Therapy:  Vancomycin trough level 10-15 mcg/ml  Plan:  1. Vancomycin 1000 mg iv once given in ED. Will order vancomycin 1250 mg iv q 18 h stacked dosing and a trough with the 4th dose.   2.Current orders for Zosyn 3.375 g iv once then 3.375 g EI q 8 h. Will increase dose to 4.5 g IV q8h EI for wt >416 kg per policy.  Rayna Sexton L 08/10/2014,1:35 PM

## 2014-08-11 ENCOUNTER — Telehealth: Payer: Self-pay

## 2014-08-11 ENCOUNTER — Encounter: Payer: Self-pay | Admitting: Physician Assistant

## 2014-08-11 DIAGNOSIS — L03211 Cellulitis of face: Secondary | ICD-10-CM | POA: Insufficient documentation

## 2014-08-11 DIAGNOSIS — R7989 Other specified abnormal findings of blood chemistry: Secondary | ICD-10-CM

## 2014-08-11 DIAGNOSIS — R778 Other specified abnormalities of plasma proteins: Secondary | ICD-10-CM | POA: Insufficient documentation

## 2014-08-11 DIAGNOSIS — M545 Low back pain, unspecified: Secondary | ICD-10-CM | POA: Insufficient documentation

## 2014-08-11 DIAGNOSIS — M5440 Lumbago with sciatica, unspecified side: Secondary | ICD-10-CM

## 2014-08-11 DIAGNOSIS — R531 Weakness: Secondary | ICD-10-CM

## 2014-08-11 MED ORDER — DOXYCYCLINE HYCLATE 100 MG PO TABS: 100.0000 mg | ORAL_TABLET | Freq: Two times a day (BID) | ORAL | Status: DC

## 2014-08-11 MED ORDER — PREDNISONE 5 MG PO TABS: ORAL_TABLET | ORAL | Status: DC

## 2014-08-11 MED ORDER — ASPIRIN 81 MG PO CHEW: 81.0000 mg | CHEWABLE_TABLET | Freq: Every day | ORAL | Status: DC

## 2014-08-11 MED ORDER — DOXYCYCLINE HYCLATE 100 MG PO TABS
100.0000 mg | ORAL_TABLET | Freq: Two times a day (BID) | ORAL | Status: DC
  Administered 2014-08-11: 100 mg via ORAL
  Filled 2014-08-11: qty 1

## 2014-08-11 NOTE — Evaluation (Signed)
Physical Therapy Evaluation Patient Details Name: Tina Pacheco MRN: [id number] DOB: Sep 06, 1950 Today's Date: 08/11/2014   History of Present Illness  Tina Pacheco  is a 64 y.o. female who presented with 3 days of progressive malaise and generalized weakness. She presented to the ED after a fall in the bathroom. She states that due to her nausea and vomiting she has not taken most of her pills, rather has not kept down her medications for the past day and a half. Her right eye conjunctivitis, significantly worse today, with erythema spreading to her eyelid with the appearance of a cellulitis. She also has significant scleral edema and injection. Hospitalists admitted patient. Since that time pt has had elevated troponins which are attributed to demand/supply ischemia per cardiology. Pt denies other falls in the last 12 months. Pt would like to return home and has 24/7 assistance from husband. Pt reports chronic low back and chronic L shoulder pain today.  Clinical Impression  Pt demonstrates safety and stability with ambulation and bed mobility during evaluation today. She is safe to return home with [facility] PT for deconditioning and balance when medically appropriate. No DME needs currently. Pt will benefit from skilled PT services to address deficits in strength, balance, and mobility in order to return to full function at home.     Follow Up Recommendations Home health PT    Equipment Recommendations  None recommended by PT    Recommendations for Other Services       Precautions / Restrictions Precautions Precautions: None Restrictions Weight Bearing Restrictions: No      Mobility  Bed Mobility Overal bed mobility: Independent             General bed mobility comments: HOB mildly elevated to simulate patient's home arrangement with pillows. No rails used for bed mobility  Transfers Overall transfer level: Modified independent Equipment used: Rolling walker (2 wheeled)  (Bariatric)             General transfer comment: Good strength and stability noted in LE during transfer. Pt does attempt to pull up on walker  Ambulation/Gait Ambulation/Gait assistance: Min guard Ambulation Distance (Feet): 100 Feet Assistive device: Rolling walker (2 wheeled) (bariatric progressing to IV pole only) Gait Pattern/deviations: Decreased step length - right;Decreased step length - left Gait velocity: Decreased Gait velocity interpretation: <1.8 ft/sec, indicative of risk for recurrent falls General Gait Details: Decreased overall speed and step length. Good stability noted. Pt fatigues with short ambulation. Vitals monitored and remain WNL throughout ambulation except RR increases excessively for such short distance.  Stairs            Wheelchair Mobility    Modified Rankin (Stroke Patients Only)       Balance Overall balance assessment: Needs assistance Sitting-balance support: No upper extremity supported;Feet supported Sitting balance-Leahy Scale: Good     Standing balance support: No upper extremity supported Standing balance-Leahy Scale: Fair   Single Leg Stance - Right Leg: 3 Single Leg Stance - Left Leg: 1     Rhomberg - Eyes Opened: 10 Rhomberg - Eyes Closed: 10                 Pertinent Vitals/Pain Pain Assessment: 0-10 Pain Score: 5  Pain Location: Low back, pt also complains of sore throat and L shoulder pain (chronic) Pain Intervention(s): Monitored during session    Home Living Family/patient expects to be discharged to:: Private residence Living Arrangements: Spouse/significant other;Other relatives Available Help at Discharge: Family Type of  Home: House Home Access: [place]: One [place]: Walker - standard;Cane - single point;Shower seat - built in;Grab bars - tub/shower      Prior Function Level of Independence: Independent with assistive device(s)         Comments:  Assist IADLs     Hand Dominance   Dominant Hand: Right    Extremity/Trunk Assessment   Upper Extremity Assessment: Overall WFL for tasks assessed (Grossly 4+/5 throughout)           Lower Extremity Assessment: Overall WFL for tasks assessed (At least 4+/5 throughout)         Communication   Communication: No difficulties  Cognition Arousal/Alertness: Awake/alert Behavior During Therapy: WFL for tasks assessed/performed Overall Cognitive Status: Within Functional Limits for tasks assessed                      General Comments      Exercises        Assessment/Plan    PT Assessment Patient needs continued PT services  PT Diagnosis Difficulty walking (Deconditioning)   PT Problem List Decreased activity tolerance;Cardiopulmonary status limiting activity;Decreased balance;Decreased strength;Decreased safety awareness;Obesity  PT Treatment Interventions DME instruction;Gait training;Functional mobility training;Therapeutic activities;Therapeutic exercise;Balance training;Neuromuscular re-education;Patient/family education   PT Goals (Current goals can be found in the Care Plan section) Acute Rehab PT Goals Patient Stated Goal: "I'm headed back home" PT Goal Formulation: With patient Time For Goal Achievement: 08/25/14 Potential to Achieve Goals: Fair    Frequency Min 2X/week   Barriers to discharge        Co-evaluation               End of Session Equipment Utilized During Treatment: Gait belt Activity Tolerance: Patient limited by fatigue Patient left: in chair;with call bell/phone within reach Nurse Communication: Mobility status;Precautions (Listed as moderate fall risk, no chair alarm applied)         Time: 7622-6333 PT Time Calculation (min) (ACUTE ONLY): 20 min   Charges:   PT Evaluation $Initial PT Evaluation Tier I: 1 Procedure     PT G Codes:       Lyndel Safe Doyce Stonehouse PT, DPT   Mara Favero 08/11/2014, 9:38 AM

## 2014-08-11 NOTE — Discharge Summary (Signed)
[facility] [place] NAME: Tina Pacheco    MR#:  [id number]  DATE OF BIRTH:  07-28-1950  DATE OF ADMISSION:  08/09/2014 ADMITTING PHYSICIAN: Lance Coon, MD  DATE OF DISCHARGE: 08/11/2014  PRIMARY CARE PHYSICIAN: Arnette Norris, MD    ADMISSION DIAGNOSIS:  Cellulitis of face [L03.211] Weakness [R53.1] NSTEMI (non-ST elevated myocardial infarction) [I21.4]  DISCHARGE DIAGNOSIS:  Active Problems:   Atrial fibrillation   HTN (hypertension)   Rheumatoid arthritis   RLS (restless legs syndrome)   Scleritis and episcleritis of right eye   SECONDARY DIAGNOSIS:   Past Medical History  Diagnosis Date  . Hypertension   . Dyslipidemia   . PAF (paroxysmal atrial fibrillation)     a. on Pradaxa  . Diastolic dysfunction   . Morbid obesity   . Hypokalemia   . History of cardiac cath     a. 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 Asthma   . Rheumatoid arthritis(714.0)   . Basal cell carcinoma   . Hyperplastic colonic polyp 2003  . History of gout   . COPD (chronic obstructive pulmonary disease)   . Diastolic dysfunction     a. echo 07/2014: EF 55-60%, no RWMA, GR2DD, mild MR, LA moderately dilated, PASP 38 mm Hg    HOSPITAL COURSE:   1. Right eyelid cellulitis and conjunctival hemorrhage. Patient was started on aggressive antibiotics of Zosyn and vancomycin. Cellulitis had improved, swelling had been better. Patient did not sleep very well in the hospital and wanted to go home. Since she is now able to open up her eyelid on her own and the redness has improved, I think it will be okay to complete treatment as outpatient. I will prescribe doxycycline orally. I spoke with Dr. Dorita Sciara on 08/10/2014 and he said he will follow-up as outpatient. The conjunctival hemorrhage is pretty much the same. 2. Elevated troponin- in speaking with cardiology they believe this is demand ischemia. Stress test as outpatient. Aspirin. 3.  Rheumatoid arthritis with back pain and knee pain- I gave a quick prednisone taper. Continue her usual medications. 4. Paroxysmal Atrial fibrillation- patient is rate controlled and anticoagulated with pradaxa. 5. Essential hypertension continue usual medications. 6. Obesity- weight loss needed.  DISCHARGE CONDITIONS:   Satisfactory  CONSULTS OBTAINED:  Treatment Team:  Wellington Hampshire, MD  DRUG ALLERGIES:  No Known Allergies  DISCHARGE MEDICATIONS:   Current Discharge Medication List    START taking these medications   Details  aspirin 81 MG chewable tablet Chew 1 tablet (81 mg total) by mouth daily. Qty: 30 tablet, Refills: 0    doxycycline (VIBRA-TABS) 100 MG tablet Take 1 tablet (100 mg total) by mouth every 12 (twelve) hours. Qty: 20 tablet, Refills: 0    predniSONE (DELTASONE) 5 MG tablet Take 4 tabs day1; Take 3 tabs day2; Take 2 tabs day3; Take one tab day4,5 Qty: 11 tablet, Refills: 0      CONTINUE these medications which have NOT CHANGED   Details  atorvastatin (LIPITOR) 20 MG tablet Take 1 tablet (20 mg total) by mouth daily. Qty: 90 tablet, Refills: 3    dabigatran (PRADAXA) 150 MG CAPS capsule Take 1 capsule (150 mg total) by mouth 2 (two) times daily. Qty: 180 capsule, Refills: 3    Febuxostat (ULORIC PO) 40 mg daily. Take one by mouth daily, pt unsure of strength    fluticasone (FLONASE) 50 MCG/ACT nasal spray Place 2 sprays into the nose daily as  needed. For allergies     gabapentin (NEURONTIN) 300 MG capsule Take 1 capsule (300 mg total) by mouth 3 (three) times daily. Take 1 capsule 2 to 3 hours prior to bedtime. Qty: 90 capsule, Refills: 3    hydroxychloroquine (PLAQUENIL) 200 MG tablet Take by mouth 2 (two) times daily.    KLOR-CON M20 20 MEQ tablet TAKE 1/2 TABLET (=10MEQ)   TWICE A DAY; MAY TAKE AN   EXTRA TABLET WHEN TAKING   TORSEMIDE Qty: 180 tablet, Refills: 3    leflunomide (ARAVA) 20 MG tablet Take 20 mg by mouth daily.    lisinopril  (PRINIVIL,ZESTRIL) 10 MG tablet take 1 tablet by mouth once daily Qty: 90 tablet, Refills: 1    loratadine (CLARITIN) 10 MG tablet Take 10 mg by mouth daily.    metoprolol succinate (TOPROL-XL) 50 MG 24 hr tablet take 1 tablet by mouth once daily Qty: 30 tablet, Refills: 3    montelukast (SINGULAIR) 10 MG tablet Take 1 tablet (10 mg total) by mouth at bedtime. Qty: 30 tablet, Refills: 3    terazosin (HYTRIN) 10 MG capsule take 1 capsule by mouth at bedtime Qty: 90 capsule, Refills: 0    torsemide (DEMADEX) 20 MG tablet Take 1 tablet (20 mg total) by mouth daily as needed. Edema Qty: 30 tablet, Refills: 3    albuterol (PROVENTIL HFA;VENTOLIN HFA) 108 (90 BASE) MCG/ACT inhaler Inhale 2 puffs into the lungs every 6 (six) hours as needed. For shortness of breath Qty: 3 Inhaler, Refills: 1    budesonide-formoterol (SYMBICORT) 160-4.5 MCG/ACT inhaler Inhale 2 puffs into the lungs 2 (two) times daily as needed. For shortness of breath Qty: 3 Inhaler, Refills: 1    Multiple Vitamin (MULTIVITAMIN) tablet Take 1 tablet by mouth daily.      nystatin (MYCOSTATIN/NYSTOP) 100000 UNIT/GM POWD Apply to area three times daily as needed for rash Qty: 60 g, Refills: 0    oxyCODONE (ROXICODONE) 5 MG immediate release tablet 2 tablets every 12 hours Qty: 270 tablet, Refills: 0    Specialty Vitamins Products (MAGNESIUM, AMINO ACID CHELATE,) 133 MG tablet Take 1 tablet by mouth at bedtime.     SUMAtriptan (IMITREX) 25 MG tablet Take 1 tablet (25 mg total) by mouth every 2 (two) hours as needed for migraine or headache. May repeat in 2 hours if headache persists or recurs. Qty: 10 tablet, Refills: 0      STOP taking these medications     pregabalin (LYRICA) 300 MG capsule          DISCHARGE INSTRUCTIONS:   Follow-up with Dr. Dorita Sciara ophthalmology this week, follow up with Dr. Rockey Situ cardiology 1 week, follow up with PMD Dr. Aram Candela Ahrendt in 2 weeks.  If you experience worsening of your  admission symptoms, develop shortness of breath, life threatening emergency, suicidal or homicidal thoughts you must seek medical attention immediately by calling 911 or calling your MD immediately  if symptoms less severe.  You Must read complete instructions/literature along with all the possible adverse reactions/side effects for all the Medicines you take and that have been prescribed to you. Take any new Medicines after you have completely understood and accept all the possible adverse reactions/side effects.   Please note  You were cared for by a hospitalist during your hospital stay. If you have any questions about your discharge medications or the care you received while you were in the hospital after you are discharged, you can call the unit and asked to speak with  the hospitalist on call if the hospitalist that took care of you is not available. Once you are discharged, your primary care physician will handle any further medical issues. Please note that NO REFILLS for any discharge medications will be authorized once you are discharged, as it is imperative that you return to your primary care physician (or establish a relationship with a primary care physician if you do not have one) for your aftercare needs so that they can reassess your need for medications and monitor your lab values.    Today   CHIEF COMPLAINT:   Chief Complaint  Patient presents with  . Weakness    HISTORY OF PRESENT ILLNESS:  Cierria Height  is a 64 y.o. female with a known history of rheumatoid arthritis, atrial fibrillation, hypertension presents with fall and redness of the right eyelid. Found to have cellulitis of the right eyelid and conjunctival hemorrhage.  VITAL SIGNS:  Blood pressure 146/62, pulse 69, temperature 98.8 F (37.1 C), temperature source Oral, resp. rate 20, height 5\' 4"  (1.626 m), weight 151.275 kg (333 lb 8 oz), SpO2 97 %.  I/O:   Intake/Output Summary (Last 24 hours) at 08/11/14  0911 Last data filed at 08/11/14 0656  Gross per 24 hour  Intake    570 ml  Output   1650 ml  Net  -1080 ml    PHYSICAL EXAMINATION:  GENERAL:  64 y.o.-year-old patient lying in the bed with no acute distress.  EYES: Pupils equal, round, reactive to light and accommodation. No scleral icterus. Extraocular muscles intact. Right conjunctival hemorrhage unchanged from yesterday. Swelling right eyelid but patient able to open eye today. Redness is less and starting to crust. HEENT: Head atraumatic, normocephalic. Oropharynx and nasopharynx clear.  NECK:  Supple, no jugular venous distention. No thyroid enlargement, no tenderness.  LUNGS: Normal breath sounds bilaterally, no wheezing, rales,rhonchi or crepitation. No use of accessory muscles of respiration.  CARDIOVASCULAR: S1, S2 normal. No murmurs, rubs, or gallops.  ABDOMEN: Soft, non-tender, non-distended. Bowel sounds present. No organomegaly or mass.  EXTREMITIES: No pedal edema, cyanosis, or clubbing.  NEUROLOGIC: Cranial nerves II through XII are intact. Muscle strength 5/5 in all extremities. Sensation intact. Gait not checked.  PSYCHIATRIC: The patient is alert and oriented x 3.  SKIN: No obvious rash, lesion, or ulcer.   DATA REVIEW:   CBC  Recent Labs Lab 08/10/14 0818  WBC 7.5  HGB 10.8*  HCT 33.8*  PLT 146*    Chemistries   Recent Labs Lab 08/09/14 2016 08/10/14 0818  NA 138 141  K 4.3 4.2  CL 106 109  CO2 24 23  GLUCOSE 118* 115*  BUN 35* 31*  CREATININE 1.37* 1.19*  CALCIUM 8.9 8.2*  AST 31  --   ALT 16  --   ALKPHOS 75  --   BILITOT 1.5*  --     Cardiac Enzymes  Recent Labs Lab 08/10/14 1348  TROPONINI 1.11*    Microbiology Results  Results for orders placed or performed during the hospital encounter of 08/09/14  Blood culture (routine x 2)     Status: None (Preliminary result)   Collection Time: 08/09/14  8:16 PM  Result Value Ref Range Status   Specimen Description BLOOD  Final    Special Requests NONE  Final   Culture NO GROWTH 2 DAYS  Final   Report Status PENDING  Incomplete  Blood culture (routine x 2)     Status: None (Preliminary result)   Collection Time:  08/09/14 10:00 PM  Result Value Ref Range Status   Specimen Description BLOOD  Final   Special Requests NONE  Final   Culture NO GROWTH 2 DAYS  Final   Report Status PENDING  Incomplete    RADIOLOGY:  Dg Chest Portable 1 View  08/09/2014   CLINICAL DATA:  Poor appetite for 3 days. Status post fall while trying to go to bathroom. Weakness and nausea. Initial encounter.  EXAM: PORTABLE CHEST - 1 VIEW  COMPARISON:  Chest radiograph performed 08/30/2011  FINDINGS: The lungs are well-aerated. Vascular congestion is noted, with increased interstitial markings, concerning for mild interstitial edema. There is no evidence of pleural effusion or pneumothorax.  The cardiomediastinal silhouette is mildly enlarged. No acute osseous abnormalities are seen.  IMPRESSION: Vascular congestion and mild cardiomegaly, with increased interstitial markings, concerning for mild interstitial edema. No definite displaced rib fracture seen.   Electronically Signed   By: Garald Balding M.D.   On: 08/09/2014 21:05   Management plans discussed with the patient, and she is in agreement.  CODE STATUS:     Code Status Orders        Start     Ordered   08/10/14 0200  Full code   Continuous     08/10/14 0200      TOTAL TIME TAKING CARE OF THIS PATIENT: 35 minutes, greater than 50% of the time was spent in counseling and coordination of care.   Loletha Grayer M.D on 08/11/2014 at 9:11 AM  Between 7am to 6pm - Pager - [phone].  After 6pm go to [email or web address] - password [email or web address] [facility]  Office  [phone]  CC: Primary care physician; Arnette Norris, MD

## 2014-08-11 NOTE — Progress Notes (Addendum)
Patient: Tina Pacheco / Admit Date: 08/09/2014 / Date of Encounter: 08/11/2014, 8:13 AM   Subjective: Continues to feel stressed. Complaining of back pain and right eye pain. Did not sleep well. No chest pain, SOB, or palpitations. Troponin trended to 1.11. Echo with normal LV function, no wall motion abnormalities, grade 1 diastolic dysfunction.   Review of Systems: Review of Systems  Constitutional: Positive for malaise/fatigue. Negative for fever, chills, weight loss and diaphoresis.  HENT: Positive for sore throat.   Eyes: Positive for blurred vision, double vision, photophobia and pain. Negative for discharge and redness.  Respiratory: Negative for cough, hemoptysis, sputum production, shortness of breath and wheezing.   Cardiovascular: Negative for chest pain, palpitations, orthopnea, claudication, leg swelling and PND.  Gastrointestinal: Negative for heartburn, nausea, vomiting, diarrhea and constipation.  Neurological: Positive for weakness and headaches. Negative for dizziness, sensory change and speech change.  Psychiatric/Behavioral: The patient is not nervous/anxious.   All other systems reviewed and are negative.   Objective: Telemetry: NSR, 70's Physical Exam: Blood pressure 146/62, pulse 69, temperature 98.8 F (37.1 C), temperature source Oral, resp. rate 20, height 5\' 4"  (1.626 m), weight 333 lb 8 oz (151.275 kg), SpO2 97 %. Body mass index is 57.22 kg/(m^2). General: Well developed, well nourished, in no acute distress. Head: Normocephalic, atraumatic, sclera non-icteric, no xanthomas, right eye cellulitis improved, nares are without discharge. Neck: Negative for carotid bruits. JVP not elevated. Lungs: Clear bilaterally to auscultation without wheezes, rales, or rhonchi. Breathing is unlabored. Heart: RRR S1 S2 without murmurs, rubs, or gallops.  Abdomen: Morbidly obese, soft, non-tender, non-distended with normoactive bowel sounds. No  rebound/guarding. Extremities: No clubbing or cyanosis. No edema. Distal pedal pulses are 2+ and equal bilaterally. Neuro: Alert and oriented X 3. Moves all extremities spontaneously. Psych:  Responds to questions appropriately with a normal affect.   Intake/Output Summary (Last 24 hours) at 08/11/14 0813 Last data filed at 08/11/14 0656  Gross per 24 hour  Intake    570 ml  Output   1650 ml  Net  -1080 ml    Inpatient Medications:  . aspirin  81 mg Oral Daily  . atorvastatin  20 mg Oral Daily  . budesonide-formoterol  2 puff Inhalation BID  . dabigatran  150 mg Oral Q12H  . gabapentin  300 mg Oral TID  . hydroxychloroquine  200 mg Oral BID  . leflunomide  20 mg Oral Daily  . metoprolol succinate  50 mg Oral Daily  . montelukast  10 mg Oral QHS  . piperacillin-tazobactam (ZOSYN)  IV  3.375 g Intravenous Once  . piperacillin-tazobactam (ZOSYN)  IV  4.5 g Intravenous 3 times per day  . predniSONE  20 mg Oral Q breakfast  . sodium chloride  3 mL Intravenous Q12H  . vancomycin  1,250 mg Intravenous Q18H  . vancomycin  1,000 mg Intravenous Once   Infusions:    Labs:  Recent Labs  08/09/14 2016 08/10/14 0818  NA 138 141  K 4.3 4.2  CL 106 109  CO2 24 23  GLUCOSE 118* 115*  BUN 35* 31*  CREATININE 1.37* 1.19*  CALCIUM 8.9 8.2*    Recent Labs  08/09/14 2016  AST 31  ALT 16  ALKPHOS 75  BILITOT 1.5*  PROT 6.8  ALBUMIN 3.5    Recent Labs  08/09/14 2016 08/10/14 0818  WBC 8.6 7.5  NEUTROABS 7.3*  --   HGB 11.1* 10.8*  HCT 33.2* 33.8*  MCV 84.5 85.2  PLT 153 146*    Recent Labs  08/09/14 2016 08/10/14 0516 08/10/14 0818 08/10/14 1348  TROPONINI 0.34* 0.35* 0.79* 1.11*   Invalid input(s): POCBNP No results for input(s): HGBA1C in the last 72 hours.   Weights: Filed Weights   08/09/14 1936 08/10/14 0100 08/11/14 0432  Weight: 325 lb (147.419 kg) 335 lb 9.6 oz (152.227 kg) 333 lb 8 oz (151.275 kg)     Radiology/Studies:  Dg Chest Portable 1  View  08/09/2014   CLINICAL DATA:  Poor appetite for 3 days. Status post fall while trying to go to bathroom. Weakness and nausea. Initial encounter.  EXAM: PORTABLE CHEST - 1 VIEW  COMPARISON:  Chest radiograph performed 08/30/2011  FINDINGS: The lungs are well-aerated. Vascular congestion is noted, with increased interstitial markings, concerning for mild interstitial edema. There is no evidence of pleural effusion or pneumothorax.  The cardiomediastinal silhouette is mildly enlarged. No acute osseous abnormalities are seen.  IMPRESSION: Vascular congestion and mild cardiomegaly, with increased interstitial markings, concerning for mild interstitial edema. No definite displaced rib fracture seen.   Electronically Signed   By: Garald Balding M.D.   On: 08/09/2014 21:05     Assessment and Plan  64 y.o. 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  1. Elevated troponin: -Troponin trended up to peak of 1.11 on 6/13 -Likely supply demand ischemia in the setting of her nausea, vomiting, and eye infection  -Echo with normal LV function and wall motion  -Recommend continued supportive care for her nausea vomiting, which seems to be improving  -Recommend outpatient nuclear stress testing (2 day Lexiscan - she is unable to tradmill 2/2 RA and knee pain) once she is clear from the above GI illness and eye issues. She prefers outpatient stress testing as well   2. Nausea and vomiting: -Generalized malaise and fatigue likely 2/2 her 3-4 days of emesis and poor po intake  -Slow reintroduction of po intake  -Improving   3. Right eye cellulitis/conjunctival hemorrhage: -Seems to be improving  -On IV  vancomycin and Zosyn per IM -Will follow up with ophthalmology as outpatient   4. PAF: -In sinus rhythm currently -On Pradaxa -Continue Toprol Xl 50 mg   5. RA/chronic pain: -Complains of back pain -This is not unusual for her -PRN morphine has been added   6. HTN: -Continue home medications -BP is slightly elevated this morning -If this continues to be elevated would add in prn hydralazine   7. HLD: -Continue Lipitor  8. Morbid obesity: -Weight loss is key -Could consider swimming as outpatient   Signed, Christell Faith, PA-C Pager: [phone] 08/11/2014, 8:13 AM    Attending Note Patient seen and examined, agree with detailed note above,  Patient presentation and plan discussed on rounds.   Back pain and continued eye swelling. No chest pain symptoms,  No shortness of breath.  Continue medical management of elevated troponin, outpt workup. Suspect secondary to stress given slip and fall on her drink out of the chair, difficulty getting up of the floor, nausea, chronic back pain.   Signed: Esmond Plants  M.D., Ph.D.

## 2014-08-11 NOTE — Discharge Instructions (Addendum)
Cellulitis Cellulitis is an infection of the skin and the tissue beneath it. The infected area is usually red and tender. Cellulitis occurs most often in the arms and lower legs.  CAUSES  Cellulitis is caused by bacteria that enter the skin through cracks or cuts in the skin. The most common types of bacteria that cause cellulitis are staphylococci and streptococci. SIGNS AND SYMPTOMS   Redness and warmth.  Swelling.  Tenderness or pain.  Fever. DIAGNOSIS  Your health care provider can usually determine what is wrong based on a physical exam. Blood tests may also be done. TREATMENT  Treatment usually involves taking an antibiotic medicine. HOME CARE INSTRUCTIONS   Take your antibiotic medicine as directed by your health care provider. Finish the antibiotic even if you start to feel better.  Keep the infected arm or leg elevated to reduce swelling.  Apply a warm cloth to the affected area up to 4 times per day to relieve pain.  Take medicines only as directed by your health care provider.  Keep all follow-up visits as directed by your health care provider. SEEK MEDICAL CARE IF:   You notice red streaks coming from the infected area.  Your red area gets larger or turns dark in color.  Your bone or joint underneath the infected area becomes painful after the skin has healed.  Your infection returns in the same area or another area.  You notice a swollen bump in the infected area.  You develop new symptoms.  You have a fever. SEEK IMMEDIATE MEDICAL CARE IF:   You feel very sleepy.  You develop vomiting or diarrhea.  You have a general ill feeling (malaise) with muscle aches and pains. MAKE SURE YOU:   Understand these instructions.  Will watch your condition.  Will get help right away if you are not doing well or get worse. Document Released: 11/23/2004 Document Revised: 06/30/2013 Document Reviewed: 05/01/2011 [facility] Patient Information 2015 [place], [place].  This information is not intended to replace advice given to you by your health care provider. Make sure you discuss any questions you have with your health care provider.  [facility] - [phone]

## 2014-08-11 NOTE — Telephone Encounter (Signed)
Patient contacted regarding discharge from [facility] on 08/11/14.  Patient understands to follow up with Christell Faith, PA on 08/26/14 at 2:30 at [facility]. Patient understands discharge instructions? yes Patient understands medications and regiment? yes Patient understands to bring all medications to this visit? yes

## 2014-08-11 NOTE — Progress Notes (Signed)
Discharge instructions explained to pt and pts spouse/ verbalized an understanding/ iv and tele removed/ home health ordered/ home meds returned to pt/ transported off  Unit via wheelchair.

## 2014-08-11 NOTE — Telephone Encounter (Signed)
-----   Message from Blain Pais sent at 08/11/2014 10:04 AM EDT ----- Regarding: tcm/ph 6/29 2:30 Tina Faith, PA

## 2014-08-11 NOTE — Care Management (Addendum)
Patient is in agreement with home health physical therapy.  Referral to [facility] as they are in network with [place].  Confirmed demographics and contact information.  Later informed by [facility] unable to accept patient.  Referral to [facility]

## 2014-08-14 LAB — CULTURE, BLOOD (ROUTINE X 2)
Culture: NO GROWTH
Culture: NO GROWTH

## 2014-08-17 ENCOUNTER — Other Ambulatory Visit: Payer: Self-pay | Admitting: Family Medicine

## 2014-08-24 ENCOUNTER — Other Ambulatory Visit: Payer: Self-pay | Admitting: *Deleted

## 2014-08-24 MED ORDER — ATORVASTATIN CALCIUM 20 MG PO TABS: 20.0000 mg | ORAL_TABLET | Freq: Every day | ORAL | Status: DC

## 2014-08-25 ENCOUNTER — Encounter: Payer: Self-pay | Admitting: Physician Assistant

## 2014-08-25 DIAGNOSIS — I2510 Atherosclerotic heart disease of native coronary artery without angina pectoris: Secondary | ICD-10-CM | POA: Insufficient documentation

## 2014-08-25 DIAGNOSIS — I480 Paroxysmal atrial fibrillation: Secondary | ICD-10-CM | POA: Insufficient documentation

## 2014-08-25 DIAGNOSIS — I25119 Atherosclerotic heart disease of native coronary artery with unspecified angina pectoris: Secondary | ICD-10-CM | POA: Insufficient documentation

## 2014-08-26 ENCOUNTER — Ambulatory Visit (INDEPENDENT_AMBULATORY_CARE_PROVIDER_SITE_OTHER): Payer: Federal, State, Local not specified - PPO | Admitting: Physician Assistant

## 2014-08-26 ENCOUNTER — Ambulatory Visit (INDEPENDENT_AMBULATORY_CARE_PROVIDER_SITE_OTHER): Payer: Federal, State, Local not specified - PPO | Admitting: Family Medicine

## 2014-08-26 ENCOUNTER — Encounter: Payer: Self-pay | Admitting: Physician Assistant

## 2014-08-26 ENCOUNTER — Encounter: Payer: Self-pay | Admitting: Family Medicine

## 2014-08-26 VITALS — BP 138/80 | HR 63 | Temp 97.7°F | Wt 335.2 lb

## 2014-08-26 VITALS — BP 120/64 | HR 62 | Ht 64.0 in | Wt 334.0 lb

## 2014-08-26 DIAGNOSIS — L03211 Cellulitis of face: Secondary | ICD-10-CM

## 2014-08-26 DIAGNOSIS — R778 Other specified abnormalities of plasma proteins: Secondary | ICD-10-CM

## 2014-08-26 DIAGNOSIS — I2510 Atherosclerotic heart disease of native coronary artery without angina pectoris: Secondary | ICD-10-CM

## 2014-08-26 DIAGNOSIS — I480 Paroxysmal atrial fibrillation: Secondary | ICD-10-CM | POA: Diagnosis not present

## 2014-08-26 DIAGNOSIS — I10 Essential (primary) hypertension: Secondary | ICD-10-CM | POA: Diagnosis not present

## 2014-08-26 DIAGNOSIS — R531 Weakness: Secondary | ICD-10-CM

## 2014-08-26 DIAGNOSIS — E669 Obesity, unspecified: Secondary | ICD-10-CM

## 2014-08-26 DIAGNOSIS — E785 Hyperlipidemia, unspecified: Secondary | ICD-10-CM

## 2014-08-26 DIAGNOSIS — R748 Abnormal levels of other serum enzymes: Secondary | ICD-10-CM | POA: Diagnosis not present

## 2014-08-26 DIAGNOSIS — R7989 Other specified abnormal findings of blood chemistry: Secondary | ICD-10-CM

## 2014-08-26 DIAGNOSIS — I519 Heart disease, unspecified: Secondary | ICD-10-CM

## 2014-08-26 DIAGNOSIS — G4730 Sleep apnea, unspecified: Secondary | ICD-10-CM

## 2014-08-26 DIAGNOSIS — I5189 Other ill-defined heart diseases: Secondary | ICD-10-CM

## 2014-08-26 LAB — COMPREHENSIVE METABOLIC PANEL
ALT: 14 U/L (ref 0–35)
AST: 20 U/L (ref 0–37)
Albumin: 3.6 g/dL (ref 3.5–5.2)
Alkaline Phosphatase: 73 U/L (ref 39–117)
BILIRUBIN TOTAL: 0.6 mg/dL (ref 0.2–1.2)
BUN: 30 mg/dL — AB (ref 6–23)
CO2: 26 mEq/L (ref 19–32)
Calcium: 10.3 mg/dL (ref 8.4–10.5)
Chloride: 105 mEq/L (ref 96–112)
Creatinine, Ser: 1.21 mg/dL — ABNORMAL HIGH (ref 0.40–1.20)
GFR: 47.53 mL/min — AB (ref >60.00)
GLUCOSE: 90 mg/dL (ref 70–99)
Potassium: 4.4 mEq/L (ref 3.5–5.1)
Sodium: 137 mEq/L (ref 135–145)
Total Protein: 6.6 g/dL (ref 6.0–8.3)

## 2014-08-26 LAB — CBC WITH DIFFERENTIAL/PLATELET
BASOS ABS: 0 10*3/uL (ref 0.0–0.1)
Basophils Relative: 0.6 % (ref 0.0–3.0)
EOS PCT: 7.5 % — AB (ref 0.0–5.0)
Eosinophils Absolute: 0.4 10*3/uL (ref 0.0–0.7)
HEMATOCRIT: 32.7 % — AB (ref 36.0–46.0)
HEMOGLOBIN: 10.6 g/dL — AB (ref 12.0–15.0)
Lymphocytes Relative: 29.6 % (ref 12.0–46.0)
Lymphs Abs: 1.6 10*3/uL (ref 0.7–4.0)
MCHC: 32.2 g/dL (ref 30.0–36.0)
MCV: 84.8 fl (ref 78.0–100.0)
MONO ABS: 0.6 10*3/uL (ref 0.1–1.0)
Monocytes Relative: 10.3 % (ref 3.0–12.0)
NEUTROS PCT: 52 % (ref 43.0–77.0)
Neutro Abs: 2.9 10*3/uL (ref 1.4–7.7)
PLATELETS: 162 10*3/uL (ref 150.0–400.0)
RBC: 3.86 Mil/uL — ABNORMAL LOW (ref 3.87–5.11)
RDW: 16.9 % — AB (ref 11.5–15.5)
WBC: 5.6 10*3/uL (ref 4.0–10.5)

## 2014-08-26 MED ORDER — TORSEMIDE 20 MG PO TABS: 20.0000 mg | ORAL_TABLET | Freq: Every day | ORAL | Status: DC | PRN

## 2014-08-26 MED ORDER — TOLTERODINE TARTRATE 1 MG PO TABS: 1.0000 mg | ORAL_TABLET | Freq: Two times a day (BID) | ORAL | Status: DC

## 2014-08-26 NOTE — Assessment & Plan Note (Signed)
Improving.  Still having chronic back pain- advised to call Dr. Amil Amen to discuss recent imaging he did of her back.

## 2014-08-26 NOTE — Assessment & Plan Note (Signed)
New- likely pre renal. Recheck renal function today.  Orders Placed This Encounter  Procedures  . Comprehensive metabolic panel  . CBC with Differential/Platelet

## 2014-08-26 NOTE — Progress Notes (Signed)
Cardiology Office Note:  Date of Encounter: 08/26/2014  ID: ELLIN FITZGIBBONS, DOB [birth year], MRN [id number]  PCP:  Arnette Norris, MD Primary Cardiologist:  Dr. Rockey Situ, MD  Chief Complaint  Patient presents with  . other    Follow up from [facility]. Meds reviewed by the patient verbally. Pt. c/o LE edema.     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    She has known nonobstructive CAD by cardiac cath during hospital admission to [facility] during March 2012. She has not had any further ischemic evaluations since that time. She has felt well since then without anginal symptoms. Echo in 2012 showed normal LV function with diastolic dysfunction. She has known episodes of PAF documented in March 2012, July 2012, and October 2013. She has been on longstanding Pradaxa and tolerating this well. At her last outpatient follow up in 05/2014 she denied any recent episodes concerning for tachycardia/a-fib. At that time she did have a cough x 3 weeks that was characterized as dry. She felt this was secondary to her allergies. She also noted significant fatigue at that time as well. She does not exercise on a regular basis secondary to her RA and chronic back pain. She takes torsemide on a prn basis. Weight has been slowly climbing, though she does not note increased PND, orthopnea, or lower extremity edema.   She was admitted to [facility] on 6/12 with a 3-4 day history of generalized maliase, fatigue, nausea, vomiting, and right eye. She stopped taking all of her medications on Friday, June 10th secondary  to her nausea and vomiting and had not taken any po medications prior to her arrival to [facility] on 6/12. She had even stopped drinking po liquids 2/2 her nausea and vomiting. She denied any chest pain, shoulder pain, SOB, palpitations, diaphoresis, edema, presyncope, or syncope. Troponin peaked at 1.11. EKG non acute. Echo showed 55-60%, no regional wall motion abnormalities, GR2DD, calcified mitral annulus with mild MR, left atrium was mildly dilated, PASP 38 mm Hg. In the setting of her nausea, vomiting, and right eye cellulitis her elevated troponin was felt to be more likely to be secondary to demand ischemia as she was not having any anginal symptoms. She was treated with supportive care, slow reintroduction of po diet, and given antibiotics for her eye with improvement.    She has felt well since her discharge from the hospital. She is sleeping the best she has in a long time. She has only taken torsemide x 2 since her discharge, possibly 6/26 or 6/27 most recently. She reports some lower extremity edema the past several days. No associated SOB, chest pain, diaphoresis, palpitations, orthopnea, PND, or early satiety. She drinks excessive amounts of water and tea daily, sometimes greater than 2L daily. She has an upcoming trip planned to the beach on 7/2 for that week and does not plan on taking her torsemide during that week as she states she will be out and about not near a restroom.   Again, no anginal symptoms and she has never been with any anginal symptoms throughout any of the above.  She reports increased stress in her life, both physical and emotional. She is starting to get a handle on some it though she continues to deal with daily chronic joint and back pains that cause her some stress.      Past Medical History  Diagnosis Date  . Hypertension   . Dyslipidemia   . PAF (paroxysmal atrial fibrillation)     a. on Pradaxa; b. CHADSVASc at least 2 (HTN & female)  . Diastolic dysfunction   .  Morbid obesity   . Hypokalemia   . History of cardiac cath     a. 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 Asthma   . Rheumatoid arthritis(714.0)   . Basal cell carcinoma   . Hyperplastic colonic polyp 2003  . History of gout   . COPD (chronic obstructive pulmonary disease)   . Diastolic dysfunction     a. echo 07/2014: EF 55-60%, no RWMA, GR2DD, mild MR, LA moderately dilated, PASP 38 mm Hg  :  Past Surgical History  Procedure Laterality Date  . Cardiac catheterization  05/24/2010    nonobstructive CAD  . Knee arthroscopy      bilateral  . Vaginal hysterectomy    . Cholecystectomy    . Cesarean section    . Colonoscopy  06/12/2011    Procedure: COLONOSCOPY;  Surgeon: Juanita Craver, MD;  Location: [facility] ENDOSCOPY;  Service: Endoscopy;  Laterality: N/A;  . Colonoscopy N/A 03/17/2013    Procedure: COLONOSCOPY;  Surgeon: Juanita Craver, MD;  Location: [facility] ENDOSCOPY;  Service: Endoscopy;  Laterality: N/A;  :  Social History:  The patient  reports that she has never smoked. She has never used smokeless tobacco. She reports that she does not drink alcohol or use illicit drugs.   Family History  Problem Relation Age of Onset  . Tuberculosis Mother   . Parkinsonism Mother   . Diabetes type II Sister   . Breast cancer Sister   . Breast cancer Maternal Aunt      Allergies:  No Known Allergies   Home Medications:  Current Outpatient Prescriptions  Medication Sig Dispense Refill  . albuterol (PROVENTIL HFA;VENTOLIN HFA) 108 (90 BASE) MCG/ACT inhaler Inhale 2 puffs into the lungs every 6 (six) hours as needed. For shortness of breath 3 Inhaler 1  . aspirin 81 MG chewable tablet Chew 1 tablet (81 mg total) by mouth daily. 30 tablet 0  . atorvastatin (LIPITOR) 20 MG tablet Take 1 tablet (20 mg total) by mouth daily. 90 tablet 3  . budesonide-formoterol (SYMBICORT) 160-4.5 MCG/ACT inhaler Inhale 2 puffs into the lungs 2 (two) times daily as needed. For shortness of breath 3 Inhaler 1    . dabigatran (PRADAXA) 150 MG CAPS capsule Take 1 capsule (150 mg total) by mouth 2 (two) times daily. 180 capsule 3  . Febuxostat (ULORIC PO) 40 mg daily. Take one by mouth daily, pt unsure of strength    . fluticasone (FLONASE) 50 MCG/ACT nasal spray Place 2 sprays into the nose daily as needed. For allergies     . gabapentin (NEURONTIN) 300 MG capsule Take 1 capsule (300 mg total) by mouth 3 (three) times daily. Take 1 capsule 2 to 3 hours prior to bedtime. 90 capsule 3  . hydroxychloroquine (PLAQUENIL) 200 MG tablet Take by mouth 2 (two) times daily.    Marland Kitchen KLOR-CON M20 20 MEQ tablet TAKE 1/2 TABLET (=10MEQ)   TWICE A DAY; MAY TAKE AN   EXTRA TABLET WHEN TAKING   TORSEMIDE 180  tablet 3  . leflunomide (ARAVA) 20 MG tablet Take 20 mg by mouth daily.    Marland Kitchen lisinopril (PRINIVIL,ZESTRIL) 10 MG tablet take 1 tablet by mouth once daily 90 tablet 1  . loratadine (CLARITIN) 10 MG tablet Take 10 mg by mouth daily.    . metoprolol succinate (TOPROL-XL) 50 MG 24 hr tablet take 1 tablet by mouth once daily 30 tablet 3  . montelukast (SINGULAIR) 10 MG tablet Take 1 tablet (10 mg total) by mouth at bedtime. 30 tablet 3  . Multiple Vitamin (MULTIVITAMIN) tablet Take 1 tablet by mouth daily.      Marland Kitchen nystatin (MYCOSTATIN/NYSTOP) 100000 UNIT/GM POWD Apply to area three times daily as needed for rash 60 g 0  . oxyCODONE (ROXICODONE) 5 MG immediate release tablet 2 tablets every 12 hours 270 tablet 0  . Specialty Vitamins Products (MAGNESIUM, AMINO ACID CHELATE,) 133 MG tablet Take 1 tablet by mouth at bedtime.     . SUMAtriptan (IMITREX) 25 MG tablet Take 1 tablet (25 mg total) by mouth every 2 (two) hours as needed for migraine or headache. May repeat in 2 hours if headache persists or recurs. 10 tablet 0  . terazosin (HYTRIN) 10 MG capsule take 1 capsule by mouth at bedtime 90 capsule 2  . tolterodine (DETROL) 1 MG tablet Take 1 tablet (1 mg total) by mouth 2 (two) times daily. 60 tablet 3  . torsemide (DEMADEX)  20 MG tablet Take 1 tablet (20 mg total) by mouth daily as needed. Edema 30 tablet 3   No current facility-administered medications for this visit.     Review of Systems:  Review of Systems  Constitutional: Negative for fever, chills, weight loss, malaise/fatigue and diaphoresis.  HENT: Negative for congestion.   Eyes: Negative for blurred vision, double vision, photophobia, pain, discharge and redness.  Respiratory: Negative for cough, hemoptysis, sputum production, shortness of breath and wheezing.   Cardiovascular: Positive for leg swelling. Negative for chest pain, palpitations, orthopnea, claudication and PND.  Gastrointestinal: Negative for heartburn, nausea, vomiting, abdominal pain, blood in stool and melena.  Genitourinary: Negative for hematuria.  Musculoskeletal: Positive for myalgias, back pain, joint pain and neck pain. Negative for falls.  Skin: Negative for itching and rash.  Neurological: Negative for sensory change, speech change, focal weakness, weakness and headaches.  Endo/Heme/Allergies: Does not bruise/bleed easily.  Psychiatric/Behavioral: Negative for hallucinations. The patient is not nervous/anxious.   All other systems reviewed and are negative.    Physical Exam:  Pulse 62, height 5\' 4"  (1.626 m), weight 334 lb (151.501 kg). BMI: Body mass index is 57.3 kg/(m^2). General: Pleasant, NAD. Psych: Normal affect. Responds to questions with normal affect.  Neuro: Alert and oriented X 3. Moves all extremities spontaneously. HEENT: Normocephalic, atraumatic. EOM intact. Sclera anicteric. Right eye cellulitis improving.   Neck: Trachea midline. Supple without bruits or JVD. Lungs:  Respirations regular and unlabored. CTA bilaterally without wheezing, crackles, or rhonchi.  Heart: RRR, normal s3, s4. No murmurs, rubs, or gallops.  Abdomen: Obese, soft, non-tender, non-distended, BS + x 4.  Extremities: No clubbing or cyanosis. 1+ non-pitting edema to the bilateral  mid calves.    Accessory Clinical Findings:  EKG: NSR, 62 bpm, low voltage, no significant st/t changes   Recent Labs: 07/06/2014: Magnesium 1.9; TSH 0.65 08/09/2014: ALT 16 08/10/2014: BUN 31*; Creatinine, Ser 1.19*; Hemoglobin 10.8*; Platelets 146*; Potassium 4.2; Sodium 141  09/16/2013: Cholesterol 114; HDL 43.50; LDL Cholesterol 55; Total CHOL/HDL Ratio 3; Triglycerides 78.0; VLDL 15.6  Estimated Creatinine Clearance: 70.4 mL/min (by C-G formula based on Cr of 1.19).  Weights: Wt Readings from Last 3 Encounters:  08/26/14 334 lb (151.501 kg)  08/26/14 335 lb 4 oz (152.068 kg)  08/11/14 333 lb 8 oz (151.275 kg)    Other studies Reviewed: Additional studies/ records that were reviewed today include: [facility] admission, cardiology notes, and PCP notes.  Assessment & Plan:  1. Nonobstructive CAD:  -Recently elevated troponins felt to be supply demand ischemia secondary to GI illness and orbital cellulitis  -Echo with normal LV function and normal wall motion  -She was started on aspirin 81 mg at discharge, this would place her on dual therapy (aspirin and Pradaxa). Given her troponin elevation ok to continue at this time until nuclear stress test is performed. If low risk study would discontinue aspirin and continue only Pradaxa  -Schedule Lexiscan Myoview (will need to be 2-day study). Will need to be Lexiscan based on patient's RA as she cannot tolerate treadmill  -She never had any anginal symptoms and continues to not have any anginal symptoms. She reports she has been under a lot of stress both physically and emotionally lately  -Continue Toprol XL 50 mg, Lipitor 20 mg, lisinopril 10 mg   2. Diastolic dysfunction: -Grade 2 on recent echo 07/2014 -1+ non-pitting edema to the bilateral calves -Weight stable   -She drinks excessive amounts of water and tea, this should be limited  -She is going out of town on 7/2 to the beach for 1 week and states she will likely not take her  torsemide during that time as she will be out and about doing things not close to a restroom  -Will have patient take torsemide scheduled up until her upcoming trip at 20 mg daily, possibly bump up to 40 mg on 6/30 pending bmet that was drawn by PCP today, then back down to 20 mg daily -Advised patient to continue her medications as directed, including torsemide while out of town  [employer] and drink choices while out of town   3. History of PAF:  -Remains in NSR  -Continue Toprol XL 50 mg daily  -Continue Pradaxa 75 mg bid  -CHADSVASc at least 2 (HTN and female) giving her an estimated annual stroke risk of 2.2%  -She is at high risk of recurrent a-fib given her untreated sleep apnea   4. HTN:  -Continue Toprol XL 50 mg, lisinopril 10 mg, Hytrin 10 mg, torsemide 20 mg prn   5. HLD:  -Continue Lipitor 20 mg   6. Obesity:  -Weight loss advised   7. Sleep apnea:  -CPAP usage advised  -Lack of CPAP usage increases her risk of recurrent a-fib   8. GI illness:  -Resolved  9. Orbital cellulitis:  -Improving -Being followed by ophthalmology, Dr. Valentina Gu, MD   Dispo: -Follow up 3 months with Dr. Rockey Situ, MD  Current medicines are reviewed at length with the patient today.  The patient did not have any concerns regarding medicines.   Christell Faith, PA-C [place], [place] [id number] [phone] [place] 08/26/2014, 2:48 PM

## 2014-08-26 NOTE — Patient Instructions (Addendum)
Medication Instructions:  INCREASE Torsemide to 20mg  daily for 3 days. Then RESUME taking Torsemide as needed  Labwork: None   Testing/Procedures: Your physician has requested that you have a lexiscan myoview. For further information please visit [email or web address]. Please follow instruction sheet, as given.   Follow-Up: Your physician recommends that you schedule a follow-up appointment in: 3 months with Dr.Gollan   Any Other Special Instructions Will Be Listed Below (If Applicable).  Lane  Your caregiver has ordered a Stress Test with nuclear imaging. The purpose of this test is to evaluate the blood supply to your heart muscle. This procedure is referred to as a "Non-Invasive Stress Test." This is because other than having an IV started in your vein, nothing is inserted or "invades" your body. Cardiac stress tests are done to find areas of poor blood flow to the heart by determining the extent of coronary artery disease (CAD). Some patients exercise on a treadmill, which naturally increases the blood flow to your heart, while others who are  unable to walk on a treadmill due to physical limitations have a pharmacologic/chemical stress agent called Lexiscan . This medicine will mimic walking on a treadmill by temporarily increasing your coronary blood flow.   Please note: these test may take anywhere between 2-4 hours to complete  PLEASE REPORT TO [facility] AT THE FIRST DESK WILL DIRECT YOU WHERE TO GO  Date of Procedure:__7/13/16 and 7/14/16___________________________________  Arrival Time for Procedure:_______7:15am both days_______________________  Instructions regarding medication:    __x__:  Hold Metoprolol night before procedure and morning of procedure   PLEASE NOTIFY THE OFFICE AT LEAST 24 HOURS IN ADVANCE IF YOU ARE UNABLE TO KEEP YOUR APPOINTMENT.  [phone] AND  PLEASE NOTIFY NUCLEAR MEDICINE AT [facility] AT LEAST 24 HOURS IN  ADVANCE IF YOU ARE UNABLE TO KEEP YOUR APPOINTMENT. [phone]  How to prepare for your Myoview test:   Do not eat or drink after midnight  No caffeine for 24 hours prior to test  No smoking 24 hours prior to test.  Your medication may be taken with water.  If your doctor stopped a medication because of this test, do not take that medication.  Ladies, please do not wear dresses.  Skirts or pants are appropriate. Please wear a short sleeve shirt.  No perfume, cologne or lotion.  Adenosine Stress Electrocardiography An adenosine stress electrocardiography is a test used to detect heart disease (coronary artery disease). Adenosine is a medicine that makes the heart arteries react as if you are exercising. Adenosine is given with a radioactive tracer. The "tracer" is a safe radioactive substance that travels in the bloodstream to the heart arteries. Special imaging cameras detect the tracer and help find blocked arteries in the heart. This test may be done with or without treadmill exercise.  LET [facility] CARE PROVIDER KNOW ABOUT:  Allergies, including latex allergies.  All prescription medicines you taking as well as all non-prescription and over-the-counter medicines, including herbs and vitamins.  Use of steroids (by mouth or creams).  Previous problems with anesthetics or novocaine.  History of blood clots or bleeding problems.  Previous surgery.  Other health problems such as kidney or lung conditions.  Possibility of pregnancy, if this applies. RISKS AND COMPLICATIONS You may develop chest discomfort, shortness of breath, sweating, or light-headedness during the test. On rare occasions, you could experience a heart attack or your heart may go into a very fast or irregular rhythm. This could cause  you to collapse. To ensure your safety, your health care provider will supervise the test. Your blood pressure and electrocardiogram are constantly watched. The test team watches  for and is able to treat any problems. BEFORE THE PROCEDURE  Do not eat or drink caffeine for 12 to 24 hours before the test. This includes all caffeinated beverages and food, such as pop, coffee (roasted, instant, decaffeinated roasted, decaffeinated instant), hot chocolate, tea, and all chocolate.  Do not smoke on the day of your test. Smoking on the day of your test may change your test results.  Do not eat anything 3 hours before the test or as recommended by your health care provider. Eating may cause an unclear image and may also cause nausea. If you are diabetic, talk to your health care provider regarding your insulin coverage.  Bring a list of all the medicines you are taking. Take your medicine as usual before the test except as told by the testing center.  Wear comfortable clothing, such as a short-sleeve shirt and sweatpants. Do not wear an underwire bra or jewelry. A hospital gown can be provided.  Shower before your appointment to reduce the spread of bacteria.  You may want to bring a book to read because there are some waiting periods during the test.  Your health care provider will go over the adenosine stress test with you, such as procedure protocol, what to expect, how long it will take, and results. PROCEDURE   An IV will be started in a vein in your hand or arm.  Electrode patches will be placed on your chest. The electrodes are connected to a monitor so your heart rhythm and heart rate can be watched. Your blood pressure will also be monitored during the test.  Two sets of images are usually taken of your heart. The images compare your heart at rest and when it is "stressed." This first image is a "resting" picture of your heart. The "resting" image is usually done before adenosine is given.  Adenosine is given in the IV over a period of 4 to 6 minutes.  After the adenosine is given, you will be monitored for a few minutes afterward to ensure your heart rate, heart  rhythm, and blood pressure are normal. AFTER THE PROCEDURE  When your test is completed, you may be asked to schedule an office visit with your health care provider to discuss the test results, or your health care provider may choose to call you with the results.  Document Released: 04/23/2006 Document Revised: 06/30/2013 Document Reviewed: 05/31/2011 [facility] Patient Information 2015 [place], [place]. This information is not intended to replace advice given to you by your health care provider. Make sure you discuss any questions you have with your health care provider.

## 2014-08-26 NOTE — Assessment & Plan Note (Signed)
?  stress ischemia. Keep appt with cardiology today.

## 2014-08-26 NOTE — Assessment & Plan Note (Signed)
New- resolving. Followed by optho

## 2014-08-26 NOTE — Progress Notes (Signed)
Pre visit review using our clinic review tool, if applicable. No additional management support is needed unless otherwise documented below in the visit note. 

## 2014-08-26 NOTE — Progress Notes (Signed)
Subjective:   Patient ID: Tina Pacheco, female    DOB: 04/18/1950, 64 y.o.   MRN: [id number]  Tina Pacheco is a pleasant 64 y.o. year old female who presents to clinic today with Hospitalization Follow-up  on 08/26/2014  HPI: Called EMS on 6/12 after she fell in the bathroom.  Days leading up to this had noticed that she couldn't open her right eye and she felt "sick"- was not eating or drinking much.  Felt weak.  Admitted to [facility] 6/12- 6/14.  Notes reviewed.  Right eye lid celluitis with conjunctival hemorrhage- optho consulted.  Started on Zosyn and vanc.  D/'d home on oral doxycline.  Followed up with optho (Dr. Valentina Gu) after discharge.  She is using erythromycin opth ointment.  Symptoms resolving. Has had no recurrent fever. Still weak but feeling better.  Elevated Troponin/?NSTEMI- cardiology consulted (Dr. Rockey Situ who is her cardiologist).  Felt ti was demand ischemia.  She has follow up scheduled with him later today.  RA/chronic pain- was given pred taper in hospital.  Lab Results  Component Value Date   CREATININE 1.19* 08/10/2014   Lab Results  Component Value Date   WBC 7.5 08/10/2014   HGB 10.8* 08/10/2014   HCT 33.8* 08/10/2014   MCV 85.2 08/10/2014   PLT 146* 08/10/2014    Current Outpatient Prescriptions on File Prior to Visit  Medication Sig Dispense Refill  . albuterol (PROVENTIL HFA;VENTOLIN HFA) 108 (90 BASE) MCG/ACT inhaler Inhale 2 puffs into the lungs every 6 (six) hours as needed. For shortness of breath 3 Inhaler 1  . aspirin 81 MG chewable tablet Chew 1 tablet (81 mg total) by mouth daily. 30 tablet 0  . atorvastatin (LIPITOR) 20 MG tablet Take 1 tablet (20 mg total) by mouth daily. 90 tablet 3  . budesonide-formoterol (SYMBICORT) 160-4.5 MCG/ACT inhaler Inhale 2 puffs into the lungs 2 (two) times daily as needed. For shortness of breath 3 Inhaler 1  . dabigatran (PRADAXA) 150 MG CAPS capsule Take 1 capsule (150 mg total) by mouth 2 (two) times  daily. 180 capsule 3  . doxycycline (VIBRA-TABS) 100 MG tablet Take 1 tablet (100 mg total) by mouth every 12 (twelve) hours. 20 tablet 0  . Febuxostat (ULORIC PO) 40 mg daily. Take one by mouth daily, pt unsure of strength    . fluticasone (FLONASE) 50 MCG/ACT nasal spray Place 2 sprays into the nose daily as needed. For allergies     . gabapentin (NEURONTIN) 300 MG capsule Take 1 capsule (300 mg total) by mouth 3 (three) times daily. Take 1 capsule 2 to 3 hours prior to bedtime. 90 capsule 3  . hydroxychloroquine (PLAQUENIL) 200 MG tablet Take by mouth 2 (two) times daily.    Marland Kitchen KLOR-CON M20 20 MEQ tablet TAKE 1/2 TABLET (=10MEQ)   TWICE A DAY; MAY TAKE AN   EXTRA TABLET WHEN TAKING   TORSEMIDE 180 tablet 3  . leflunomide (ARAVA) 20 MG tablet Take 20 mg by mouth daily.    Marland Kitchen lisinopril (PRINIVIL,ZESTRIL) 10 MG tablet take 1 tablet by mouth once daily 90 tablet 1  . loratadine (CLARITIN) 10 MG tablet Take 10 mg by mouth daily.    . metoprolol succinate (TOPROL-XL) 50 MG 24 hr tablet take 1 tablet by mouth once daily 30 tablet 3  . montelukast (SINGULAIR) 10 MG tablet Take 1 tablet (10 mg total) by mouth at bedtime. 30 tablet 3  . Multiple Vitamin (MULTIVITAMIN) tablet Take 1 tablet by mouth daily.      Marland Kitchen  nystatin (MYCOSTATIN/NYSTOP) 100000 UNIT/GM POWD Apply to area three times daily as needed for rash 60 g 0  . oxyCODONE (ROXICODONE) 5 MG immediate release tablet 2 tablets every 12 hours 270 tablet 0  . predniSONE (DELTASONE) 5 MG tablet Take 4 tabs day1; Take 3 tabs day2; Take 2 tabs day3; Take one tab day4,5 11 tablet 0  . Specialty Vitamins Products (MAGNESIUM, AMINO ACID CHELATE,) 133 MG tablet Take 1 tablet by mouth at bedtime.     . SUMAtriptan (IMITREX) 25 MG tablet Take 1 tablet (25 mg total) by mouth every 2 (two) hours as needed for migraine or headache. May repeat in 2 hours if headache persists or recurs. 10 tablet 0  . terazosin (HYTRIN) 10 MG capsule take 1 capsule by mouth at bedtime  90 capsule 2   No current facility-administered medications on file prior to visit.    No Known Allergies  Past Medical History  Diagnosis Date  . Hypertension   . Dyslipidemia   . PAF (paroxysmal atrial fibrillation)     a. on Pradaxa; b. CHADSVASc at least 2 (HTN & female)  . Diastolic dysfunction   . Morbid obesity   . Hypokalemia   . History of cardiac cath     a. 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 Asthma   . Rheumatoid arthritis(714.0)   . Basal cell carcinoma   . Hyperplastic colonic polyp 2003  . History of gout   . COPD (chronic obstructive pulmonary disease)   . Diastolic dysfunction     a. echo 07/2014: EF 55-60%, no RWMA, GR2DD, mild MR, LA moderately dilated, PASP 38 mm Hg    Past Surgical History  Procedure Laterality Date  . Cardiac catheterization  05/24/2010    nonobstructive CAD  . Knee arthroscopy      bilateral  . Vaginal hysterectomy    . Cholecystectomy    . Cesarean section    . Colonoscopy  06/12/2011    Procedure: COLONOSCOPY;  Surgeon: Juanita Craver, MD;  Location: [facility];  Service: Endoscopy;  Laterality: N/A;  . Colonoscopy N/A 03/17/2013    Procedure: COLONOSCOPY;  Surgeon: Juanita Craver, MD;  Location: [facility];  Service: Endoscopy;  Laterality: N/A;    Family History  Problem Relation Age of Onset  . Tuberculosis Mother   . Parkinsonism Mother   . Diabetes type II Sister   . Breast cancer Sister   . Breast cancer Maternal Aunt     History   Social History  . Marital Status: Married    Spouse Name: N/A  . Number of Children: N/A  . Years of Education: N/A   Occupational History  . Not on file.   Social History Main Topics  . Smoking status: Never Smoker   . Smokeless tobacco: Never Used  . Alcohol Use: No  . Drug Use: No  . Sexual Activity: Not on file   Other Topics Concern  . Not on file   Social History Narrative   The PMH, PSH, Social History, Family History, Medications, and allergies have been  reviewed in [facility], and have been updated if relevant.   Review of Systems  Constitutional: Positive for fatigue. Negative for fever.  HENT: Negative.   Respiratory: Negative.   Cardiovascular: Negative.   Gastrointestinal: Negative.   Endocrine: Negative.   Genitourinary: Negative.   Musculoskeletal: Positive for back pain.  Skin: Negative.   Allergic/Immunologic: Negative.   Neurological: Negative.   Hematological: Negative.   Psychiatric/Behavioral: Negative.  All other systems reviewed and are negative.      Objective:    BP 138/80 mmHg  Pulse 63  Temp(Src) 97.7 F (36.5 C) (Oral)  Wt 335 lb 4 oz (152.068 kg)  SpO2 95%   Physical Exam  Constitutional: She is oriented to person, place, and time. She appears well-developed and well-nourished. No distress.  HENT:  Head: Normocephalic.  Eyes: EOM are normal. Pupils are equal, round, and reactive to light. Right conjunctiva is injected. Right conjunctiva has no hemorrhage. Left conjunctiva is not injected. Left conjunctiva has no hemorrhage.  Healing ecchymosis below right eye  Neck: Neck supple.  Cardiovascular: Normal rate.   Pulmonary/Chest: Effort normal.  Musculoskeletal: Normal range of motion.  Neurological: She is alert and oriented to person, place, and time. No cranial nerve deficit.  Skin: Skin is warm and dry.  Psychiatric: She has a normal mood and affect. Her behavior is normal. Judgment and thought content normal.          Assessment & Plan:   Elevated serum creatinine - Plan: Comprehensive metabolic panel  Cellulitis of face - Plan: CBC with Differential/Platelet  Elevated troponin No Follow-up on file.

## 2014-09-01 ENCOUNTER — Telehealth: Payer: Self-pay | Admitting: *Deleted

## 2014-09-01 NOTE — Telephone Encounter (Signed)
Pt is calling for she is at the beach this week and can't make the stress test that is scheduled for tomorrow  Please call pt so we can reschedule it.

## 2014-09-07 ENCOUNTER — Telehealth: Payer: Self-pay | Admitting: *Deleted

## 2014-09-07 NOTE — Telephone Encounter (Signed)
Spoke w/ pt.  2 day Lexi resched for 7/13 & 7/14 @ 7:30.

## 2014-09-07 NOTE — Telephone Encounter (Signed)
Pt is calling stating that she would like to have the stress test on July 13 and 14 th  Please call her when we have done this.

## 2014-09-08 ENCOUNTER — Ambulatory Visit (INDEPENDENT_AMBULATORY_CARE_PROVIDER_SITE_OTHER): Payer: Federal, State, Local not specified - PPO | Admitting: Family Medicine

## 2014-09-08 ENCOUNTER — Ambulatory Visit (INDEPENDENT_AMBULATORY_CARE_PROVIDER_SITE_OTHER)
Admission: RE | Admit: 2014-09-08 | Discharge: 2014-09-08 | Disposition: A | Discharge status: Home / Self Care | Payer: Federal, State, Local not specified - PPO | Source: Ambulatory Visit | Attending: Family Medicine | Admitting: Family Medicine

## 2014-09-08 ENCOUNTER — Encounter: Payer: Self-pay | Admitting: Family Medicine

## 2014-09-08 VITALS — BP 136/74 | HR 71 | Temp 97.6°F | Wt 326.5 lb

## 2014-09-08 DIAGNOSIS — L03211 Cellulitis of face: Secondary | ICD-10-CM | POA: Diagnosis not present

## 2014-09-08 DIAGNOSIS — M25561 Pain in right knee: Secondary | ICD-10-CM

## 2014-09-08 DIAGNOSIS — R748 Abnormal levels of other serum enzymes: Secondary | ICD-10-CM | POA: Diagnosis not present

## 2014-09-08 DIAGNOSIS — D649 Anemia, unspecified: Secondary | ICD-10-CM | POA: Diagnosis not present

## 2014-09-08 DIAGNOSIS — M179 Osteoarthritis of knee, unspecified: Secondary | ICD-10-CM

## 2014-09-08 DIAGNOSIS — R778 Other specified abnormalities of plasma proteins: Secondary | ICD-10-CM

## 2014-09-08 DIAGNOSIS — R7989 Other specified abnormal findings of blood chemistry: Secondary | ICD-10-CM

## 2014-09-08 DIAGNOSIS — M1711 Unilateral primary osteoarthritis, right knee: Secondary | ICD-10-CM

## 2014-09-08 LAB — VITAMIN B12: Vitamin B-12: 403 pg/mL (ref 211–911)

## 2014-09-08 LAB — CBC WITH DIFFERENTIAL/PLATELET
BASOS ABS: 0 10*3/uL (ref 0.0–0.1)
Basophils Relative: 0.6 % (ref 0.0–3.0)
EOS ABS: 0.5 10*3/uL (ref 0.0–0.7)
Eosinophils Relative: 9.2 % — ABNORMAL HIGH (ref 0.0–5.0)
HCT: 34.3 % — ABNORMAL LOW (ref 36.0–46.0)
Hemoglobin: 11.2 g/dL — ABNORMAL LOW (ref 12.0–15.0)
LYMPHS PCT: 24.8 % (ref 12.0–46.0)
Lymphs Abs: 1.3 10*3/uL (ref 0.7–4.0)
MCHC: 32.7 g/dL (ref 30.0–36.0)
MCV: 84.1 fl (ref 78.0–100.0)
Monocytes Absolute: 0.5 10*3/uL (ref 0.1–1.0)
Monocytes Relative: 9.7 % (ref 3.0–12.0)
NEUTROS ABS: 2.9 10*3/uL (ref 1.4–7.7)
Neutrophils Relative %: 55.7 % (ref 43.0–77.0)
Platelets: 193 10*3/uL (ref 150.0–400.0)
RBC: 4.07 Mil/uL (ref 3.87–5.11)
RDW: 16 % — ABNORMAL HIGH (ref 11.5–15.5)
WBC: 5.2 10*3/uL (ref 4.0–10.5)

## 2014-09-08 LAB — FERRITIN: Ferritin: 43.6 ng/mL (ref 10.0–291.0)

## 2014-09-08 LAB — COMPREHENSIVE METABOLIC PANEL
ALBUMIN: 3.7 g/dL (ref 3.5–5.2)
ALT: 16 U/L (ref 0–35)
AST: 21 U/L (ref 0–37)
Alkaline Phosphatase: 78 U/L (ref 39–117)
BILIRUBIN TOTAL: 0.8 mg/dL (ref 0.2–1.2)
BUN: 21 mg/dL (ref 6–23)
CO2: 30 mEq/L (ref 19–32)
Calcium: 10.4 mg/dL (ref 8.4–10.5)
Chloride: 106 mEq/L (ref 96–112)
Creatinine, Ser: 1.12 mg/dL (ref 0.40–1.20)
GFR: 51.96 mL/min — ABNORMAL LOW (ref >60.00)
Glucose, Bld: 94 mg/dL (ref 70–99)
Potassium: 4.5 mEq/L (ref 3.5–5.1)
Sodium: 141 mEq/L (ref 135–145)
Total Protein: 6.9 g/dL (ref 6.0–8.3)

## 2014-09-08 NOTE — Assessment & Plan Note (Signed)
Stress test scheduled for tomorrow.

## 2014-09-08 NOTE — Patient Instructions (Signed)
Good to see you. We will you with your xray and lab results.  We will call you with an ortho appointment.

## 2014-09-08 NOTE — Assessment & Plan Note (Signed)
Likely prerenal.  Recheck renal function today.

## 2014-09-08 NOTE — Assessment & Plan Note (Signed)
Persistent with UTD colonoscopy and no overt bleeding.  Repeat today.  If remains low, refer to hematology. The patient indicates understanding of these issues and agrees with the plan.

## 2014-09-08 NOTE — Assessment & Plan Note (Signed)
Resolved.  No further work up or tx needed at this time.

## 2014-09-08 NOTE — Progress Notes (Signed)
Pre visit review using our clinic review tool, if applicable. No additional management support is needed unless otherwise documented below in the visit note. 

## 2014-09-08 NOTE — Progress Notes (Signed)
Subjective:   Patient ID: Tina Pacheco, female    DOB: [birth year], 64 y.o.   MRN: [id number]  IA Tina Pacheco is a pleasant 64 y.o. year old female who presents to clinic today with Follow-up  on 09/08/2014  HPI:   Saw her for hospital follow up 2 weeks ago.  Called EMS on 6/12 after she fell in the bathroom.  Days leading up to this had noticed that she couldn't open her right eye and she felt "sick"- was not eating or drinking much.  Felt weak.  Admitted to [facility] 6/12- 6/14.  Notes reviewed.  Right eye lid celluitis with conjunctival hemorrhage- doing well.  This has resolved.  Elevated Troponin/?NSTEMI- cardiology consulted (Dr. Rockey Situ who is her cardiologist).  Felt it was demand ischemia. Scheduled for stress test tomorrow.  RA/chronic pain with known OA and h/o previous knee surgeries- was given pred taper in hospital.  Now right knee is giving her more problems.  Pain swelling and pain with walking.  No known injury .  Cr remained elevated, still anemic so I advised her to follow up with me here today to ensure symptoms resolving.  Colonoscopy last year- she has not noticed any blood in her stool or elsewhere.  Lab Results  Component Value Date   CREATININE 1.21* 08/26/2014   Lab Results  Component Value Date   WBC 5.6 08/26/2014   HGB 10.6* 08/26/2014   HCT 32.7* 08/26/2014   MCV 84.8 08/26/2014   PLT 162.0 08/26/2014    Current Outpatient Prescriptions on File Prior to Visit  Medication Sig Dispense Refill  . albuterol (PROVENTIL HFA;VENTOLIN HFA) 108 (90 BASE) MCG/ACT inhaler Inhale 2 puffs into the lungs every 6 (six) hours as needed. For shortness of breath 3 Inhaler 1  . aspirin 81 MG chewable tablet Chew 1 tablet (81 mg total) by mouth daily. 30 tablet 0  . atorvastatin (LIPITOR) 20 MG tablet Take 1 tablet (20 mg total) by mouth daily. 90 tablet 3  . budesonide-formoterol (SYMBICORT) 160-4.5 MCG/ACT inhaler Inhale 2 puffs into the lungs 2 (two) times daily  as needed. For shortness of breath 3 Inhaler 1  . dabigatran (PRADAXA) 150 MG CAPS capsule Take 1 capsule (150 mg total) by mouth 2 (two) times daily. 180 capsule 3  . Febuxostat (ULORIC PO) 40 mg daily. Take one by mouth daily, pt unsure of strength    . fluticasone (FLONASE) 50 MCG/ACT nasal spray Place 2 sprays into the nose daily as needed. For allergies     . gabapentin (NEURONTIN) 300 MG capsule Take 1 capsule (300 mg total) by mouth 3 (three) times daily. Take 1 capsule 2 to 3 hours prior to bedtime. 90 capsule 3  . hydroxychloroquine (PLAQUENIL) 200 MG tablet Take by mouth 2 (two) times daily.    Marland Kitchen KLOR-CON M20 20 MEQ tablet TAKE 1/2 TABLET (=10MEQ)   TWICE A DAY; MAY TAKE AN   EXTRA TABLET WHEN TAKING   TORSEMIDE 180 tablet 3  . leflunomide (ARAVA) 20 MG tablet Take 20 mg by mouth daily.    Marland Kitchen lisinopril (PRINIVIL,ZESTRIL) 10 MG tablet take 1 tablet by mouth once daily 90 tablet 1  . loratadine (CLARITIN) 10 MG tablet Take 10 mg by mouth daily.    . metoprolol succinate (TOPROL-XL) 50 MG 24 hr tablet take 1 tablet by mouth once daily 30 tablet 3  . montelukast (SINGULAIR) 10 MG tablet Take 1 tablet (10 mg total) by mouth at bedtime. 30 tablet 3  .  Multiple Vitamin (MULTIVITAMIN) tablet Take 1 tablet by mouth daily.      Marland Kitchen nystatin (MYCOSTATIN/NYSTOP) 100000 UNIT/GM POWD Apply to area three times daily as needed for rash 60 g 0  . oxyCODONE (ROXICODONE) 5 MG immediate release tablet 2 tablets every 12 hours 270 tablet 0  . Specialty Vitamins Products (MAGNESIUM, AMINO ACID CHELATE,) 133 MG tablet Take 1 tablet by mouth at bedtime.     . SUMAtriptan (IMITREX) 25 MG tablet Take 1 tablet (25 mg total) by mouth every 2 (two) hours as needed for migraine or headache. May repeat in 2 hours if headache persists or recurs. 10 tablet 0  . terazosin (HYTRIN) 10 MG capsule take 1 capsule by mouth at bedtime 90 capsule 2  . tolterodine (DETROL) 1 MG tablet Take 1 tablet (1 mg total) by mouth 2 (two)  times daily. 60 tablet 3  . torsemide (DEMADEX) 20 MG tablet Take 1 tablet (20 mg total) by mouth daily as needed. Edema 30 tablet 3   No current facility-administered medications on file prior to visit.    No Known Allergies  Past Medical History  Diagnosis Date  . Hypertension   . Dyslipidemia   . PAF (paroxysmal atrial fibrillation)     a. on Pradaxa; b. CHADSVASc at least 2 (HTN & female)  . Diastolic dysfunction   . Morbid obesity   . Hypokalemia   . History of cardiac cath     a. 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 Asthma   . Rheumatoid arthritis(714.0)   . Basal cell carcinoma   . Hyperplastic colonic polyp 2003  . History of gout   . COPD (chronic obstructive pulmonary disease)   . Diastolic dysfunction     a. echo 07/2014: EF 55-60%, no RWMA, GR2DD, mild MR, LA moderately dilated, PASP 38 mm Hg  . Sleep apnea     a. not compliant with CPAP    Past Surgical History  Procedure Laterality Date  . Cardiac catheterization  05/24/2010    nonobstructive CAD  . Knee arthroscopy      bilateral  . Vaginal hysterectomy    . Cholecystectomy    . Cesarean section    . Colonoscopy  06/12/2011    Procedure: COLONOSCOPY;  Surgeon: Juanita Craver, MD;  Location: [facility];  Service: Endoscopy;  Laterality: N/A;  . Colonoscopy N/A 03/17/2013    Procedure: COLONOSCOPY;  Surgeon: Juanita Craver, MD;  Location: [facility];  Service: Endoscopy;  Laterality: N/A;    Family History  Problem Relation Age of Onset  . Tuberculosis Mother   . Parkinsonism Mother   . Diabetes type II Sister   . Breast cancer Sister   . Breast cancer Maternal Aunt     History   Social History  . Marital Status: Married    Spouse Name: N/A  . Number of Children: N/A  . Years of Education: N/A   Occupational History  . Not on file.   Social History Main Topics  . Smoking status: Never Smoker   . Smokeless tobacco: Never Used  . Alcohol Use: No  . Drug Use: No  . Sexual Activity:  Not on file   Other Topics Concern  . Not on file   Social History Narrative   The PMH, PSH, Social History, Family History, Medications, and allergies have been reviewed in [facility], and have been updated if relevant.   Review of Systems  Constitutional: Positive for fatigue. Negative for fever.  HENT: Negative.  Respiratory: Negative.   Cardiovascular: Negative.   Gastrointestinal: Negative.  Negative for blood in stool and anal bleeding.  Endocrine: Negative.   Genitourinary: Negative.   Musculoskeletal: Positive for back pain and joint swelling.  Skin: Negative.   Allergic/Immunologic: Negative.   Neurological: Negative.   Hematological: Negative.   Psychiatric/Behavioral: Negative.   All other systems reviewed and are negative.      Objective:    BP 136/74 mmHg  Pulse 71  Temp(Src) 97.6 F (36.4 C) (Oral)  Wt 326 lb 8 oz (148.099 kg)  SpO2 95%   Physical Exam  Constitutional: She is oriented to person, place, and time. She appears well-developed and well-nourished. No distress.  HENT:  Head: Normocephalic.  Eyes: EOM are normal. Pupils are equal, round, and reactive to light. Right conjunctiva is not injected. Right conjunctiva has no hemorrhage. Left conjunctiva is not injected. Left conjunctiva has no hemorrhage.  Ecchymosis resolved below right eye  Neck: Neck supple.  Cardiovascular: Normal rate.   Pulmonary/Chest: Effort normal.  Musculoskeletal: Normal range of motion.       Right knee: She exhibits swelling. She exhibits normal range of motion, no erythema, normal alignment, no LCL laxity, normal patellar mobility, no bony tenderness, normal meniscus and no MCL laxity.  Neurological: She is alert and oriented to person, place, and time. No cranial nerve deficit.  Skin: Skin is warm and dry.  Psychiatric: She has a normal mood and affect. Her behavior is normal. Judgment and thought content normal.          Assessment & Plan:   Elevated serum  creatinine - Plan: Comprehensive metabolic panel  Anemia, unspecified anemia type - Plan: Ferritin, CBC with Differential/Platelet, Vitamin B12  Osteoarthritis of right knee, unspecified osteoarthritis type - Plan: DG Knee Complete 4 Views Right, Ambulatory referral to Orthopedic Surgery  Cellulitis of face No Follow-up on file.

## 2014-09-08 NOTE — Assessment & Plan Note (Signed)
New- with known OA, RA, h/o knee surgeries.  Difficult to exam due to body habitus. Xray today and due to complexity of her case, refer to ortho. The patient indicates understanding of these issues and agrees with the plan.

## 2014-09-09 ENCOUNTER — Encounter
Admission: RE | Admit: 2014-09-09 | Discharge: 2014-09-09 | Disposition: A | Discharge status: Home / Self Care | Payer: Federal, State, Local not specified - PPO | Source: Ambulatory Visit | Attending: Physician Assistant | Admitting: Physician Assistant

## 2014-09-09 ENCOUNTER — Encounter: Payer: Self-pay | Admitting: *Deleted

## 2014-09-09 DIAGNOSIS — R7989 Other specified abnormal findings of blood chemistry: Secondary | ICD-10-CM | POA: Insufficient documentation

## 2014-09-09 DIAGNOSIS — R778 Other specified abnormalities of plasma proteins: Secondary | ICD-10-CM

## 2014-09-09 MED ORDER — TECHNETIUM TC 99M SESTAMIBI - CARDIOLITE
30.0000 | Freq: Once | INTRAVENOUS | Status: AC | PRN
  Administered 2014-09-09: 08:00:00 32.15 via INTRAVENOUS

## 2014-09-09 MED ORDER — REGADENOSON 0.4 MG/5ML IV SOLN
0.4000 mg | Freq: Once | INTRAVENOUS | Status: DC
  Administered 2014-09-09: 0.4 mg via INTRAVENOUS
  Filled 2014-09-09: qty 5

## 2014-09-10 MED ORDER — TECHNETIUM TC 99M SESTAMIBI - CARDIOLITE
33.0000 | Freq: Once | INTRAVENOUS | Status: AC | PRN
  Administered 2014-09-10: 30.568 via INTRAVENOUS

## 2014-09-11 LAB — NM MYOCAR MULTI W/SPECT W/WALL MOTION / EF
CHL CUP NUCLEAR SRS: 11
CHL CUP NUCLEAR SSS: 6
CSEPHR: 61 %
CSEPPHR: 96 {beats}/min
LV dias vol: 22 mL
LVSYSVOL: 86 mL
NUC STRESS TID: 0.9
Rest HR: 71 {beats}/min
SDS: 5

## 2014-09-14 ENCOUNTER — Telehealth: Payer: Self-pay | Admitting: Family Medicine

## 2014-09-14 NOTE — Telephone Encounter (Signed)
Pt scheduled 09/22/14 @ 9:45am with Dr. Sabra Heck at [place]

## 2014-09-14 NOTE — Telephone Encounter (Signed)
Pt called checking on her ortho appointment she would like a call before you make appointment

## 2014-09-17 ENCOUNTER — Other Ambulatory Visit: Payer: Self-pay | Admitting: Family Medicine

## 2014-10-01 ENCOUNTER — Ambulatory Visit (INDEPENDENT_AMBULATORY_CARE_PROVIDER_SITE_OTHER): Payer: Federal, State, Local not specified - PPO | Admitting: Primary Care

## 2014-10-01 ENCOUNTER — Encounter: Payer: Self-pay | Admitting: Primary Care

## 2014-10-01 VITALS — BP 138/76 | HR 69 | Temp 97.9°F | Ht 64.0 in | Wt 327.0 lb

## 2014-10-01 DIAGNOSIS — M79604 Pain in right leg: Secondary | ICD-10-CM

## 2014-10-01 DIAGNOSIS — R05 Cough: Secondary | ICD-10-CM | POA: Diagnosis not present

## 2014-10-01 DIAGNOSIS — R059 Cough, unspecified: Secondary | ICD-10-CM

## 2014-10-01 MED ORDER — HYDROCOD POLST-CPM POLST ER 10-8 MG/5ML PO SUER: 5.0000 mL | Freq: Two times a day (BID) | ORAL | Status: DC | PRN

## 2014-10-01 NOTE — Patient Instructions (Signed)
You may take the Tussionex twice daily as needed for cough. Be cautious as this may make you drowsy.  Stop by the front and speak with Rosaria Ferries to get scheduled for your test.  It was a pleasure meeting you!

## 2014-10-01 NOTE — Progress Notes (Signed)
Subjective:    Patient ID: Tina Pacheco, female    DOB: [birth year], 64 y.o.   MRN: [id number]  HPI  Tina Pacheco is a 64 year old female who presents today with a chief complaint of cough. She reports a history of chronic cough related to allergies. Her cough started 2 days ago with sneezing, sore throat. Denies fever, chills. She has not tried taking anything OTC for her cough this time because she has tried everything in the past.   2) Leg pain: Chronic. Was referred to an orthopedist several weeks ago, had an xray, and was told everything was normal. She was told to wear a walking boot but has not done so due to balance issues. She continues to experience pain and weakness which is not new. Over the last several weeks her pain is present from the right knee down through the calf and achilles tendon. She has also been evaluated by her rheumatologist and podiatrist who report chronic arthritis. She describes her pain as sharp and burning. Denies calf swelling, redness, warmth.   Review of Systems  Constitutional: Negative for fever and chills.  HENT: Positive for postnasal drip. Negative for congestion, ear pain and sinus pressure.        Occasional sore throat  Respiratory: Positive for cough and shortness of breath.   Cardiovascular: Negative for chest pain.  Gastrointestinal: Negative for nausea and vomiting.  Musculoskeletal: Positive for myalgias and arthralgias.       Past Medical History  Diagnosis Date  . Hypertension   . Dyslipidemia   . PAF (paroxysmal atrial fibrillation)     a. on Pradaxa; b. CHADSVASc at least 2 (HTN & female)  . Diastolic dysfunction   . Morbid obesity   . Hypokalemia   . History of cardiac cath     a. 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 Asthma   . Rheumatoid arthritis(714.0)   . Basal cell carcinoma   . Hyperplastic colonic polyp 2003  . History of gout   . COPD (chronic obstructive pulmonary disease)   . Diastolic dysfunction     a. echo 07/2014: EF 55-60%, no RWMA, GR2DD, mild MR, LA moderately dilated, PASP 38 mm Hg  . Sleep apnea     a. not compliant with CPAP    History   Social History  . Marital Status: Married    Spouse Name: N/A  . Number of Children: N/A  . Years of Education: N/A   Occupational History  . Not on file.   Social History Main Topics  . Smoking status: Never Smoker   . Smokeless tobacco: Never Used  . Alcohol Use: No  . Drug Use: No  . Sexual Activity: Not on file   Other Topics Concern  . Not on file   Social History Narrative    Past Surgical History  Procedure Laterality Date  . Cardiac catheterization  05/24/2010    nonobstructive CAD  . Knee arthroscopy      bilateral  . Vaginal hysterectomy    . Cholecystectomy    . Cesarean section    . Colonoscopy  06/12/2011    Procedure: COLONOSCOPY;  Surgeon: Juanita Craver, MD;  Location: [facility];  Service: Endoscopy;  Laterality: N/A;  . Colonoscopy N/A 03/17/2013    Procedure: COLONOSCOPY;  Surgeon: Juanita Craver, MD;  Location: [facility];  Service: Endoscopy;  Laterality: N/A;    Family History  Problem Relation Age of Onset  . Tuberculosis Mother   .  Parkinsonism Mother   . Diabetes type II Sister   . Breast cancer Sister   . Breast cancer Maternal Aunt     No Known Allergies  Current Outpatient Prescriptions on File Prior to Visit  Medication Sig Dispense Refill  . albuterol (PROVENTIL HFA;VENTOLIN HFA) 108 (90 BASE) MCG/ACT inhaler Inhale 2 puffs into the lungs every 6 (six) hours as needed. For shortness of breath 3 Inhaler 1  . aspirin 81 MG chewable tablet Chew 1 tablet (81 mg total) by mouth daily. 30 tablet 0  . atorvastatin (LIPITOR) 20 MG tablet Take 1 tablet (20 mg total) by mouth daily. 90 tablet 3  . budesonide-formoterol (SYMBICORT) 160-4.5 MCG/ACT inhaler Inhale 2 puffs into the lungs 2 (two) times daily as needed. For shortness of breath 3 Inhaler 1  . dabigatran (PRADAXA) 150 MG CAPS capsule  Take 1 capsule (150 mg total) by mouth 2 (two) times daily. 180 capsule 3  . Febuxostat (ULORIC PO) 40 mg daily. Take one by mouth daily, pt unsure of strength    . fluticasone (FLONASE) 50 MCG/ACT nasal spray Place 2 sprays into the nose daily as needed. For allergies     . gabapentin (NEURONTIN) 300 MG capsule Take 1 capsule (300 mg total) by mouth 3 (three) times daily. Take 1 capsule 2 to 3 hours prior to bedtime. 90 capsule 3  . hydroxychloroquine (PLAQUENIL) 200 MG tablet Take by mouth 2 (two) times daily.    Marland Kitchen KLOR-CON M20 20 MEQ tablet TAKE 1/2 TABLET (=10MEQ)   TWICE A DAY; MAY TAKE AN   EXTRA TABLET WHEN TAKING   TORSEMIDE 180 tablet 3  . leflunomide (ARAVA) 20 MG tablet Take 20 mg by mouth daily.    Marland Kitchen lisinopril (PRINIVIL,ZESTRIL) 10 MG tablet take 1 tablet by mouth once daily 90 tablet 3  . loratadine (CLARITIN) 10 MG tablet Take 10 mg by mouth daily.    . metoprolol succinate (TOPROL-XL) 50 MG 24 hr tablet take 1 tablet by mouth once daily 30 tablet 3  . montelukast (SINGULAIR) 10 MG tablet Take 1 tablet (10 mg total) by mouth at bedtime. 30 tablet 3  . Multiple Vitamin (MULTIVITAMIN) tablet Take 1 tablet by mouth daily.      Marland Kitchen nystatin (MYCOSTATIN/NYSTOP) 100000 UNIT/GM POWD Apply to area three times daily as needed for rash 60 g 0  . oxyCODONE (ROXICODONE) 5 MG immediate release tablet 2 tablets every 12 hours 270 tablet 0  . Specialty Vitamins Products (MAGNESIUM, AMINO ACID CHELATE,) 133 MG tablet Take 1 tablet by mouth at bedtime.     . SUMAtriptan (IMITREX) 25 MG tablet Take 1 tablet (25 mg total) by mouth every 2 (two) hours as needed for migraine or headache. May repeat in 2 hours if headache persists or recurs. 10 tablet 0  . terazosin (HYTRIN) 10 MG capsule take 1 capsule by mouth at bedtime 90 capsule 2  . tolterodine (DETROL) 1 MG tablet Take 1 tablet (1 mg total) by mouth 2 (two) times daily. 60 tablet 3  . torsemide (DEMADEX) 20 MG tablet Take 1 tablet (20 mg total) by  mouth daily as needed. Edema 30 tablet 3   No current facility-administered medications on file prior to visit.    BP 138/76 mmHg  Pulse 69  Temp(Src) 97.9 F (36.6 C) (Oral)  Ht 5\' 4"  (1.626 m)  Wt 327 lb (148.326 kg)  BMI 56.10 kg/m2  SpO2 96%    Objective:   Physical Exam  Constitutional:  Ambulatory in clinic with cane.  Cardiovascular: Normal rate and regular rhythm.   Pulses:      Dorsalis pedis pulses are 2+ on the right side, and 2+ on the left side.       Posterior tibial pulses are 2+ on the right side, and 2+ on the left side.  No dependent rubor noted. No calf swelling, tenderness, warmth, redness. Negative homan's sign.  Pulmonary/Chest: Effort normal and breath sounds normal.  Musculoskeletal:  Decreased ROM to right knee, chronic. Tender upon palpation to right chin.  Skin: Skin is warm and dry.          Assessment & Plan:  Leg pain:  Right lower extremity, anterior below knee. Chronic, however worsening "burning and sharp" pain over past 3 weeks. Previously evaluated by ortho, rheumatologist, podiatrist who report arthritis.  Could be vascular problem, but do not suspect DVT. Will order ABI's to rule out PAD. Case discussed with PCP who agrees ABI's to rule out vascular disorder.

## 2014-10-02 ENCOUNTER — Ambulatory Visit (INDEPENDENT_AMBULATORY_CARE_PROVIDER_SITE_OTHER): Payer: Federal, State, Local not specified - PPO

## 2014-10-02 ENCOUNTER — Other Ambulatory Visit: Payer: Self-pay | Admitting: Primary Care

## 2014-10-02 DIAGNOSIS — M79604 Pain in right leg: Secondary | ICD-10-CM

## 2014-10-05 ENCOUNTER — Other Ambulatory Visit: Payer: Self-pay | Admitting: *Deleted

## 2014-10-05 MED ORDER — DABIGATRAN ETEXILATE MESYLATE 150 MG PO CAPS: 150.0000 mg | ORAL_CAPSULE | Freq: Two times a day (BID) | ORAL | Status: DC

## 2014-10-12 ENCOUNTER — Encounter: Payer: Self-pay | Admitting: Family Medicine

## 2014-10-12 ENCOUNTER — Ambulatory Visit (INDEPENDENT_AMBULATORY_CARE_PROVIDER_SITE_OTHER): Payer: Federal, State, Local not specified - PPO | Admitting: Family Medicine

## 2014-10-12 VITALS — BP 146/84 | HR 76 | Temp 97.8°F | Wt 314.5 lb

## 2014-10-12 DIAGNOSIS — R05 Cough: Secondary | ICD-10-CM | POA: Diagnosis not present

## 2014-10-12 DIAGNOSIS — G894 Chronic pain syndrome: Secondary | ICD-10-CM | POA: Diagnosis not present

## 2014-10-12 DIAGNOSIS — G2581 Restless legs syndrome: Secondary | ICD-10-CM

## 2014-10-12 DIAGNOSIS — M25561 Pain in right knee: Secondary | ICD-10-CM | POA: Diagnosis not present

## 2014-10-12 DIAGNOSIS — R059 Cough, unspecified: Secondary | ICD-10-CM

## 2014-10-12 MED ORDER — HYDROCOD POLST-CPM POLST ER 10-8 MG/5ML PO SUER: 5.0000 mL | Freq: Two times a day (BID) | ORAL | Status: DC | PRN

## 2014-10-12 MED ORDER — OXYCODONE HCL 5 MG PO TABS: ORAL_TABLET | ORAL | Status: DC

## 2014-10-12 MED ORDER — CYCLOBENZAPRINE HCL 5 MG PO TABS: 5.0000 mg | ORAL_TABLET | Freq: Three times a day (TID) | ORAL | Status: DC | PRN

## 2014-10-12 NOTE — Assessment & Plan Note (Signed)
Deteriorated. >25 minutes spent in face to face time with patient, >50% spent in counselling or coordination of care Body habitus, OA and RA all contributory. At this point,  I explained that she has multiple specialists and there is not much else I can offer.  I did refill her oxycodone and will try a low dose of flexeril as needed - up to three times daily for her back pain.  Discussed sedation precautions. I would like to avoids NSAIDs since she does take pradaxa and this will increase her risk of bleeds. The patient indicates understanding of these issues and agrees with the plan.

## 2014-10-12 NOTE — Progress Notes (Signed)
Subjective:   Patient ID: Tina Pacheco, female    DOB: Dec 25, 1950, 64 y.o.   MRN: [id number]  Tina Pacheco is a pleasant 64 y.o. year old female who presents to clinic today with Follow-up  on 10/12/2014  HPI:  Persistent leg and knee pain- chronic issue, bilateral.  Knee xray on 7/12- severe degenerative changes of right knee.  Referred her to ortho for this- Saw Dr. Sabra Heck- note reviewed.  Advised NSAID like Mobic which she refused and was told to wear a walking boot which is hard for her to wear due to balancing issues.  Also has been evaluated by her podiatrist and rheumatologist for this complaint over past several months.  Saw Owens Shark, NP for this as well on 8/4- note reviewed. ABI ordered- neg.  Advised to follow up with me here today since pain is persisting.  Has known RA/OA with h/o previous knee surgeries.  Taking Gabapentin, plaquenil and as needed oxycodone for severe pain. Right knee and leg pain continue to bother her, especially at night.  Gabapentin did help a little with RLS but those symptoms were worse last night.  Current Outpatient Prescriptions on File Prior to Visit  Medication Sig Dispense Refill  . albuterol (PROVENTIL HFA;VENTOLIN HFA) 108 (90 BASE) MCG/ACT inhaler Inhale 2 puffs into the lungs every 6 (six) hours as needed. For shortness of breath 3 Inhaler 1  . aspirin 81 MG chewable tablet Chew 1 tablet (81 mg total) by mouth daily. 30 tablet 0  . atorvastatin (LIPITOR) 20 MG tablet Take 1 tablet (20 mg total) by mouth daily. 90 tablet 3  . budesonide-formoterol (SYMBICORT) 160-4.5 MCG/ACT inhaler Inhale 2 puffs into the lungs 2 (two) times daily as needed. For shortness of breath 3 Inhaler 1  . dabigatran (PRADAXA) 150 MG CAPS capsule Take 1 capsule (150 mg total) by mouth 2 (two) times daily. 180 capsule 3  . Febuxostat (ULORIC PO) 40 mg daily. Take one by mouth daily, pt unsure of strength    . fluticasone (FLONASE) 50 MCG/ACT nasal spray  Place 2 sprays into the nose daily as needed. For allergies     . gabapentin (NEURONTIN) 300 MG capsule Take 1 capsule (300 mg total) by mouth 3 (three) times daily. Take 1 capsule 2 to 3 hours prior to bedtime. 90 capsule 3  . hydroxychloroquine (PLAQUENIL) 200 MG tablet Take by mouth 2 (two) times daily.    Marland Kitchen KLOR-CON M20 20 MEQ tablet TAKE 1/2 TABLET (=10MEQ)   TWICE A DAY; MAY TAKE AN   EXTRA TABLET WHEN TAKING   TORSEMIDE 180 tablet 3  . leflunomide (ARAVA) 20 MG tablet Take 20 mg by mouth daily.    Marland Kitchen lisinopril (PRINIVIL,ZESTRIL) 10 MG tablet take 1 tablet by mouth once daily 90 tablet 3  . loratadine (CLARITIN) 10 MG tablet Take 10 mg by mouth daily.    . metoprolol succinate (TOPROL-XL) 50 MG 24 hr tablet take 1 tablet by mouth once daily 30 tablet 3  . montelukast (SINGULAIR) 10 MG tablet Take 1 tablet (10 mg total) by mouth at bedtime. 30 tablet 3  . Multiple Vitamin (MULTIVITAMIN) tablet Take 1 tablet by mouth daily.      Marland Kitchen nystatin (MYCOSTATIN/NYSTOP) 100000 UNIT/GM POWD Apply to area three times daily as needed for rash 60 g 0  . Specialty Vitamins Products (MAGNESIUM, AMINO ACID CHELATE,) 133 MG tablet Take 1 tablet by mouth at bedtime.     . SUMAtriptan (IMITREX) 25 MG  tablet Take 1 tablet (25 mg total) by mouth every 2 (two) hours as needed for migraine or headache. May repeat in 2 hours if headache persists or recurs. 10 tablet 0  . terazosin (HYTRIN) 10 MG capsule take 1 capsule by mouth at bedtime 90 capsule 2  . tolterodine (DETROL) 1 MG tablet Take 1 tablet (1 mg total) by mouth 2 (two) times daily. 60 tablet 3  . torsemide (DEMADEX) 20 MG tablet Take 1 tablet (20 mg total) by mouth daily as needed. Edema 30 tablet 3   No current facility-administered medications on file prior to visit.    No Known Allergies  Past Medical History  Diagnosis Date  . Hypertension   . Dyslipidemia   . PAF (paroxysmal atrial fibrillation)     a. on Pradaxa; b. CHADSVASc at least 2 (HTN &  female)  . Diastolic dysfunction   . Morbid obesity   . Hypokalemia   . History of cardiac cath     a. 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 Asthma   . Rheumatoid arthritis(714.0)   . Basal cell carcinoma   . Hyperplastic colonic polyp 2003  . History of gout   . COPD (chronic obstructive pulmonary disease)   . Diastolic dysfunction     a. echo 07/2014: EF 55-60%, no RWMA, GR2DD, mild MR, LA moderately dilated, PASP 38 mm Hg  . Sleep apnea     a. not compliant with CPAP    Past Surgical History  Procedure Laterality Date  . Cardiac catheterization  05/24/2010    nonobstructive CAD  . Knee arthroscopy      bilateral  . Vaginal hysterectomy    . Cholecystectomy    . Cesarean section    . Colonoscopy  06/12/2011    Procedure: COLONOSCOPY;  Surgeon: Juanita Craver, MD;  Location: [facility];  Service: Endoscopy;  Laterality: N/A;  . Colonoscopy N/A 03/17/2013    Procedure: COLONOSCOPY;  Surgeon: Juanita Craver, MD;  Location: [facility];  Service: Endoscopy;  Laterality: N/A;    Family History  Problem Relation Age of Onset  . Tuberculosis Mother   . Parkinsonism Mother   . Diabetes type II Sister   . Breast cancer Sister   . Breast cancer Maternal Aunt     Social History   Social History  . Marital Status: Married    Spouse Name: N/A  . Number of Children: N/A  . Years of Education: N/A   Occupational History  . Not on file.   Social History Main Topics  . Smoking status: Never Smoker   . Smokeless tobacco: Never Used  . Alcohol Use: No  . Drug Use: No  . Sexual Activity: Not on file   Other Topics Concern  . Not on file   Social History Narrative   The PMH, PSH, Social History, Family History, Medications, and allergies have been reviewed in [facility], and have been updated if relevant.   Review of Systems  Constitutional: Positive for fatigue.  Respiratory: Positive for cough. Negative for shortness of breath, wheezing and stridor.   Cardiovascular:  Negative.   Gastrointestinal: Negative.   Musculoskeletal: Positive for myalgias and back pain.  Skin: Negative.   Hematological: Negative.   Psychiatric/Behavioral: Negative.   All other systems reviewed and are negative.      Objective:    BP 146/84 mmHg  Pulse 76  Temp(Src) 97.8 F (36.6 C) (Oral)  Wt 314 lb 8 oz (142.656 kg)  SpO2 96%  Physical Exam  Constitutional: She is oriented to person, place, and time. She appears well-developed and well-nourished. No distress.  obese  HENT:  Head: Normocephalic.  Eyes: Conjunctivae are normal.  Cardiovascular: Normal rate.   Pulmonary/Chest: Effort normal.  Neurological: She is alert and oriented to person, place, and time. No cranial nerve deficit.  Skin: Skin is warm and dry.  Psychiatric: She has a normal mood and affect. Her behavior is normal. Judgment and thought content normal.          Assessment & Plan:   Cough - Plan: chlorpheniramine-HYDROcodone (TUSSIONEX PENNKINETIC ER) 10-8 MG/5ML SUER  Right knee pain  RLS (restless legs syndrome)  Chronic pain syndrome No Follow-up on file.

## 2014-10-12 NOTE — Progress Notes (Signed)
Pre visit review using our clinic review tool, if applicable. No additional management support is needed unless otherwise documented below in the visit note. 

## 2014-10-15 ENCOUNTER — Other Ambulatory Visit: Payer: Self-pay | Admitting: Family Medicine

## 2014-10-22 ENCOUNTER — Other Ambulatory Visit: Payer: Self-pay

## 2014-10-22 DIAGNOSIS — R059 Cough, unspecified: Secondary | ICD-10-CM

## 2014-10-22 DIAGNOSIS — R05 Cough: Secondary | ICD-10-CM

## 2014-10-22 MED ORDER — HYDROCOD POLST-CPM POLST ER 10-8 MG/5ML PO SUER: 5.0000 mL | Freq: Two times a day (BID) | ORAL | Status: DC | PRN

## 2014-10-22 NOTE — Telephone Encounter (Signed)
Pt left v/m requesting rx for tussionex. Call when ready for pick up. Pt seen and rx printed # 123ml on 10/12/14. Pt is still coughing as when seen on 10/12/14.Please advise.

## 2014-10-22 NOTE — Telephone Encounter (Signed)
Spoke to Dr Deborra Medina, who states she misread message. Pt just received Rx on 10/12/2014 and will not be able to receive another Rx. Rx discarded.

## 2014-10-27 ENCOUNTER — Ambulatory Visit (INDEPENDENT_AMBULATORY_CARE_PROVIDER_SITE_OTHER): Payer: Federal, State, Local not specified - PPO | Admitting: Family Medicine

## 2014-10-27 ENCOUNTER — Encounter: Payer: Self-pay | Admitting: Family Medicine

## 2014-10-27 VITALS — BP 128/66 | HR 74 | Temp 98.3°F | Wt 310.0 lb

## 2014-10-27 DIAGNOSIS — R059 Cough, unspecified: Secondary | ICD-10-CM

## 2014-10-27 DIAGNOSIS — R05 Cough: Secondary | ICD-10-CM

## 2014-10-27 DIAGNOSIS — I10 Essential (primary) hypertension: Secondary | ICD-10-CM

## 2014-10-27 DIAGNOSIS — R053 Chronic cough: Secondary | ICD-10-CM

## 2014-10-27 MED ORDER — BENZONATATE 200 MG PO CAPS: 200.0000 mg | ORAL_CAPSULE | Freq: Two times a day (BID) | ORAL | Status: DC | PRN

## 2014-10-27 MED ORDER — HYDROCOD POLST-CPM POLST ER 10-8 MG/5ML PO SUER: 5.0000 mL | Freq: Two times a day (BID) | ORAL | Status: DC | PRN

## 2014-10-27 MED ORDER — AMLODIPINE BESYLATE 5 MG PO TABS: 5.0000 mg | ORAL_TABLET | Freq: Every day | ORAL | Status: DC

## 2014-10-27 NOTE — Assessment & Plan Note (Signed)
Chronic and likely multifactorial now with codeine over use. D/c ACEI. eRx sent for tessalon and advised using Tussionex only for severe cough. Lung exam again reassuring today and again declines pulmonary referral.

## 2014-10-27 NOTE — Assessment & Plan Note (Signed)
>  25 minutes spent in face to face time with patient, >50% spent in counselling or coordination of care concerning cough and HTN. Discussed trial of stopping lisinopril and she agrees. D/c lisinopril, start norvasc 5 mg daily and continue toprol and demadex at current doses. Follow up with me in 2 weeks.

## 2014-10-27 NOTE — Patient Instructions (Signed)
Great to see you. Please stop taking Lisinopril. Start Norvasc 5 mg daily.  Tessalon for cough, ok to use tussionex for severe cough.  Come see me in 2 weeks.

## 2014-10-27 NOTE — Progress Notes (Signed)
Patient ID: Tina Pacheco, female    DOB: 14-Jan-1951, 64 y.o.   MRN: [id number]  HPI Comments: Ms. Flener is a 64 year old with complicated medical history, including  paroxysmal atrial fibrillation here for chronic cough deterioration.  Chronic cough- has had chronic cough for years.  Previous episodes of tickle in her throat that leads to terrible cough, often has post tussive emesis.    H/o allergic rhinitis/asthma- currently taking flonase,zyrtec, and symbicort.     CXRs have been neg previously.  Tussionex usually helps but she has been requesting refills too often.  We had discussed trial of stopping lisinopril in past but she felt cough started prior to starting lisinopril years ago.  Also takes Toprol XL and demadex (along with potassium) for HTN.      Outpatient Encounter Prescriptions as of 10/27/2014  Medication Sig  . albuterol (PROVENTIL HFA;VENTOLIN HFA) 108 (90 BASE) MCG/ACT inhaler Inhale 2 puffs into the lungs every 6 (six) hours as needed. For shortness of breath  . aspirin 81 MG chewable tablet Chew 1 tablet (81 mg total) by mouth daily.  Marland Kitchen atorvastatin (LIPITOR) 20 MG tablet Take 1 tablet (20 mg total) by mouth daily.  . budesonide-formoterol (SYMBICORT) 160-4.5 MCG/ACT inhaler Inhale 2 puffs into the lungs 2 (two) times daily as needed. For shortness of breath  . cyclobenzaprine (FLEXERIL) 5 MG tablet Take 1 tablet (5 mg total) by mouth 3 (three) times daily as needed for muscle spasms.  . dabigatran (PRADAXA) 150 MG CAPS capsule Take 1 capsule (150 mg total) by mouth 2 (two) times daily.  . Febuxostat (ULORIC PO) 40 mg daily. Take one by mouth daily, pt unsure of strength  . fluticasone (FLONASE) 50 MCG/ACT nasal spray Place 2 sprays into the nose daily as needed. For allergies   . gabapentin (NEURONTIN) 300 MG capsule Take 1 capsule (300 mg total) by mouth 3 (three) times daily. Take 1 capsule 2 to 3 hours prior to bedtime.  . hydroxychloroquine (PLAQUENIL)  200 MG tablet Take by mouth 2 (two) times daily.  Marland Kitchen KLOR-CON M20 20 MEQ tablet TAKE 1/2 TABLET (=10MEQ)   TWICE A DAY; MAY TAKE AN   EXTRA TABLET WHEN TAKING   TORSEMIDE  . leflunomide (ARAVA) 20 MG tablet Take 20 mg by mouth daily.  Marland Kitchen lisinopril (PRINIVIL,ZESTRIL) 10 MG tablet take 1 tablet by mouth once daily  . loratadine (CLARITIN) 10 MG tablet Take 10 mg by mouth daily.  . metoprolol succinate (TOPROL-XL) 50 MG 24 hr tablet take 1 tablet by mouth once daily  . montelukast (SINGULAIR) 10 MG tablet Take 1 tablet (10 mg total) by mouth at bedtime.  . Multiple Vitamin (MULTIVITAMIN) tablet Take 1 tablet by mouth daily.    Marland Kitchen nystatin (MYCOSTATIN/NYSTOP) 100000 UNIT/GM POWD Apply to area three times daily as needed for rash  . oxyCODONE (ROXICODONE) 5 MG immediate release tablet 2 tablets every 12 hours  . Specialty Vitamins Products (MAGNESIUM, AMINO ACID CHELATE,) 133 MG tablet Take 1 tablet by mouth at bedtime.   . SUMAtriptan (IMITREX) 25 MG tablet as directed by prescriber take 1 tablet by mouth if needed for MIGRAINE or headache and may repeat in 2 hours IF headache PERSISTS OR RECURS  . terazosin (HYTRIN) 10 MG capsule take 1 capsule by mouth at bedtime  . tolterodine (DETROL) 1 MG tablet Take 1 tablet (1 mg total) by mouth 2 (two) times daily.  Marland Kitchen torsemide (DEMADEX) 20 MG tablet Take 1 tablet (20 mg total) by  mouth daily as needed. Edema  . [DISCONTINUED] chlorpheniramine-HYDROcodone (TUSSIONEX PENNKINETIC ER) 10-8 MG/5ML SUER Take 5 mLs by mouth every 12 (twelve) hours as needed for cough.   No facility-administered encounter medications on file as of 10/27/2014.   Current Outpatient Prescriptions on File Prior to Visit  Medication Sig Dispense Refill  . albuterol (PROVENTIL HFA;VENTOLIN HFA) 108 (90 BASE) MCG/ACT inhaler Inhale 2 puffs into the lungs every 6 (six) hours as needed. For shortness of breath 3 Inhaler 1  . aspirin 81 MG chewable tablet Chew 1 tablet (81 mg total) by mouth  daily. 30 tablet 0  . atorvastatin (LIPITOR) 20 MG tablet Take 1 tablet (20 mg total) by mouth daily. 90 tablet 3  . budesonide-formoterol (SYMBICORT) 160-4.5 MCG/ACT inhaler Inhale 2 puffs into the lungs 2 (two) times daily as needed. For shortness of breath 3 Inhaler 1  . cyclobenzaprine (FLEXERIL) 5 MG tablet Take 1 tablet (5 mg total) by mouth 3 (three) times daily as needed for muscle spasms. 30 tablet 1  . dabigatran (PRADAXA) 150 MG CAPS capsule Take 1 capsule (150 mg total) by mouth 2 (two) times daily. 180 capsule 3  . Febuxostat (ULORIC PO) 40 mg daily. Take one by mouth daily, pt unsure of strength    . fluticasone (FLONASE) 50 MCG/ACT nasal spray Place 2 sprays into the nose daily as needed. For allergies     . gabapentin (NEURONTIN) 300 MG capsule Take 1 capsule (300 mg total) by mouth 3 (three) times daily. Take 1 capsule 2 to 3 hours prior to bedtime. 90 capsule 3  . hydroxychloroquine (PLAQUENIL) 200 MG tablet Take by mouth 2 (two) times daily.    Marland Kitchen KLOR-CON M20 20 MEQ tablet TAKE 1/2 TABLET (=10MEQ)   TWICE A DAY; MAY TAKE AN   EXTRA TABLET WHEN TAKING   TORSEMIDE 180 tablet 3  . leflunomide (ARAVA) 20 MG tablet Take 20 mg by mouth daily.    Marland Kitchen lisinopril (PRINIVIL,ZESTRIL) 10 MG tablet take 1 tablet by mouth once daily 90 tablet 3  . loratadine (CLARITIN) 10 MG tablet Take 10 mg by mouth daily.    . metoprolol succinate (TOPROL-XL) 50 MG 24 hr tablet take 1 tablet by mouth once daily 30 tablet 3  . montelukast (SINGULAIR) 10 MG tablet Take 1 tablet (10 mg total) by mouth at bedtime. 30 tablet 3  . Multiple Vitamin (MULTIVITAMIN) tablet Take 1 tablet by mouth daily.      Marland Kitchen nystatin (MYCOSTATIN/NYSTOP) 100000 UNIT/GM POWD Apply to area three times daily as needed for rash 60 g 0  . oxyCODONE (ROXICODONE) 5 MG immediate release tablet 2 tablets every 12 hours 270 tablet 0  . Specialty Vitamins Products (MAGNESIUM, AMINO ACID CHELATE,) 133 MG tablet Take 1 tablet by mouth at bedtime.      . SUMAtriptan (IMITREX) 25 MG tablet as directed by prescriber take 1 tablet by mouth if needed for MIGRAINE or headache and may repeat in 2 hours IF headache PERSISTS OR RECURS 10 tablet 0  . terazosin (HYTRIN) 10 MG capsule take 1 capsule by mouth at bedtime 90 capsule 2  . tolterodine (DETROL) 1 MG tablet Take 1 tablet (1 mg total) by mouth 2 (two) times daily. 60 tablet 3  . torsemide (DEMADEX) 20 MG tablet Take 1 tablet (20 mg total) by mouth daily as needed. Edema 30 tablet 3   No current facility-administered medications on file prior to visit.    No Known Allergies  Past Medical History  Diagnosis Date  . Hypertension   . Dyslipidemia   . PAF (paroxysmal atrial fibrillation)     a. on Pradaxa; b. CHADSVASc at least 2 (HTN & female)  . Diastolic dysfunction   . Morbid obesity   . Hypokalemia   . History of cardiac cath     a. 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 Asthma   . Rheumatoid arthritis(714.0)   . Basal cell carcinoma   . Hyperplastic colonic polyp 2003  . History of gout   . COPD (chronic obstructive pulmonary disease)   . Diastolic dysfunction     a. echo 07/2014: EF 55-60%, no RWMA, GR2DD, mild MR, LA moderately dilated, PASP 38 mm Hg  . Sleep apnea     a. not compliant with CPAP    Past Surgical History  Procedure Laterality Date  . Cardiac catheterization  05/24/2010    nonobstructive CAD  . Knee arthroscopy      bilateral  . Vaginal hysterectomy    . Cholecystectomy    . Cesarean section    . Colonoscopy  06/12/2011    Procedure: COLONOSCOPY;  Surgeon: Juanita Craver, MD;  Location: [facility];  Service: Endoscopy;  Laterality: N/A;  . Colonoscopy N/A 03/17/2013    Procedure: COLONOSCOPY;  Surgeon: Juanita Craver, MD;  Location: [facility];  Service: Endoscopy;  Laterality: N/A;    Family History  Problem Relation Age of Onset  . Tuberculosis Mother   . Parkinsonism Mother   . Diabetes type II Sister   . Breast cancer Sister   . Breast  cancer Maternal Aunt     Social History   Social History  . Marital Status: Married    Spouse Name: N/A  . Number of Children: N/A  . Years of Education: N/A   Occupational History  . Not on file.   Social History Main Topics  . Smoking status: Never Smoker   . Smokeless tobacco: Never Used  . Alcohol Use: No  . Drug Use: No  . Sexual Activity: Not on file   Other Topics Concern  . Not on file   Social History Narrative   The PMH, PSH, Social History, Family History, Medications, and allergies have been reviewed in [facility], and have been updated if relevant.   Review of Systems  Review of Systems  Constitutional: Negative.   HENT: Positive for rhinorrhea.   Respiratory: Positive for cough. Negative for shortness of breath, wheezing and stridor.   Cardiovascular: Negative.   Gastrointestinal: Negative.   Musculoskeletal: Negative.   Skin: Negative.   Neurological: Negative.   All other systems reviewed and are negative.     Physical Exam  BP 128/66 mmHg  Pulse 74  Temp(Src) 98.3 F (36.8 C) (Oral)  Wt 310 lb (140.615 kg)  SpO2 95%  Constitutional: She is oriented to person, place, and time. She appears well-developed and well-nourished.       obese  HENT:  Head: Normocephalic.  Nose: Nose normal.  Eyes: Conjunctivae are normal. Pupils are equal, round, and reactive to light, no nyastagmus.  Neck: Normal range of motion. Neck supple. No JVD present.  Cardiovascular: Normal rate, regular rhythm, S1 normal, S2 normal, normal heart sounds and intact distal pulses.  Exam reveals no gallop and no friction rub.   No murmur heard. Pulmonary/Chest: Effort normal and breath sounds normal. No respiratory distress. She has no wheezes. She has no rales. She exhibits no tenderness.  Abdominal: Soft. Bowel sounds are normal. She exhibits no  distension. There is no tenderness.  Musculoskeletal: Normal range of motion. She exhibits no edema and no tenderness.   Lymphadenopathy:    She has no cervical adenopathy.  Neurological: She is alert and oriented to person, place, and time. Coordination normal.  Skin: Skin is warm and dry. No rash noted. No erythema.  Psychiatric: She has a normal mood and affect. Her behavior is normal. Judgment and thought content normal.

## 2014-10-27 NOTE — Progress Notes (Signed)
Pre visit review using our clinic review tool, if applicable. No additional management support is needed unless otherwise documented below in the visit note. 

## 2014-11-04 ENCOUNTER — Other Ambulatory Visit: Payer: Self-pay | Admitting: Family Medicine

## 2014-11-10 ENCOUNTER — Ambulatory Visit (INDEPENDENT_AMBULATORY_CARE_PROVIDER_SITE_OTHER): Payer: Federal, State, Local not specified - PPO | Admitting: Family Medicine

## 2014-11-10 VITALS — BP 136/62 | HR 69 | Temp 97.9°F | Wt 311.8 lb

## 2014-11-10 DIAGNOSIS — R053 Chronic cough: Secondary | ICD-10-CM

## 2014-11-10 DIAGNOSIS — R05 Cough: Secondary | ICD-10-CM

## 2014-11-10 DIAGNOSIS — G2581 Restless legs syndrome: Secondary | ICD-10-CM | POA: Diagnosis not present

## 2014-11-10 DIAGNOSIS — I10 Essential (primary) hypertension: Secondary | ICD-10-CM

## 2014-11-10 MED ORDER — GABAPENTIN 300 MG PO CAPS: 300.0000 mg | ORAL_CAPSULE | Freq: Four times a day (QID) | ORAL | Status: DC

## 2014-11-10 NOTE — Progress Notes (Signed)
Pre visit review using our clinic review tool, if applicable. No additional management support is needed unless otherwise documented below in the visit note. 

## 2014-11-10 NOTE — Patient Instructions (Signed)
Great to see you. Let's continue your current medications for now and keep me updated.

## 2014-11-10 NOTE — Progress Notes (Signed)
Subjective:   Patient ID: Tina Pacheco, female    DOB: 1950/03/15, 63 y.o.   MRN: [id number]  Tina Pacheco is a pleasant 64 y.o. year old female who presents to clinic today with Follow-up  on 11/10/2014  HPI:  Well known to me.  History of chronic cough.  2 weeks ago- d/c'd her ACEI to see if would help with cough.  D/c'd lisinopril and started norvasc 5 mg daily.  Advised to continue toprol and demadex at previous doses. She does feel cough has improved slightly.  NO LE edema.  RLS seems worse- currently taking Gabapentin 300 mg three times daily.  Initially felt gabapentin worked well.\   Tessalon perles for cough- still having some cough.  Trying now to take tussionex.    Current Outpatient Prescriptions on File Prior to Visit  Medication Sig Dispense Refill  . albuterol (PROVENTIL HFA;VENTOLIN HFA) 108 (90 BASE) MCG/ACT inhaler Inhale 2 puffs into the lungs every 6 (six) hours as needed. For shortness of breath 3 Inhaler 1  . amLODipine (NORVASC) 5 MG tablet Take 1 tablet (5 mg total) by mouth daily. 90 tablet 3  . aspirin 81 MG chewable tablet Chew 1 tablet (81 mg total) by mouth daily. 30 tablet 0  . atorvastatin (LIPITOR) 20 MG tablet Take 1 tablet (20 mg total) by mouth daily. 90 tablet 3  . benzonatate (TESSALON) 200 MG capsule Take 1 capsule (200 mg total) by mouth 2 (two) times daily as needed for cough. 30 capsule 0  . budesonide-formoterol (SYMBICORT) 160-4.5 MCG/ACT inhaler Inhale 2 puffs into the lungs 2 (two) times daily as needed. For shortness of breath 3 Inhaler 1  . chlorpheniramine-HYDROcodone (TUSSIONEX PENNKINETIC ER) 10-8 MG/5ML SUER Take 5 mLs by mouth every 12 (twelve) hours as needed for cough. 100 mL 0  . cyclobenzaprine (FLEXERIL) 5 MG tablet Take 1 tablet (5 mg total) by mouth 3 (three) times daily as needed for muscle spasms. 30 tablet 1  . dabigatran (PRADAXA) 150 MG CAPS capsule Take 1 capsule (150 mg total) by mouth 2 (two) times daily. 180  capsule 3  . Febuxostat (ULORIC PO) 40 mg daily. Take one by mouth daily, pt unsure of strength    . fluticasone (FLONASE) 50 MCG/ACT nasal spray Place 2 sprays into the nose daily as needed. For allergies     . gabapentin (NEURONTIN) 300 MG capsule Take 1 capsule (300 mg total) by mouth 3 (three) times daily. Take 1 capsule 2 to 3 hours prior to bedtime. 90 capsule 3  . hydroxychloroquine (PLAQUENIL) 200 MG tablet Take by mouth 2 (two) times daily.    Marland Kitchen KLOR-CON M20 20 MEQ tablet TAKE 1/2 TABLET (=10MEQ)   TWICE A DAY; MAY TAKE AN   EXTRA TABLET WHEN TAKING   TORSEMIDE 180 tablet 3  . leflunomide (ARAVA) 20 MG tablet Take 20 mg by mouth daily.    Marland Kitchen loratadine (CLARITIN) 10 MG tablet Take 10 mg by mouth daily.    . metoprolol succinate (TOPROL-XL) 50 MG 24 hr tablet take 1 tablet by mouth once daily 30 tablet 3  . montelukast (SINGULAIR) 10 MG tablet take 1 tablet by mouth at bedtime 30 tablet 3  . Multiple Vitamin (MULTIVITAMIN) tablet Take 1 tablet by mouth daily.      Marland Kitchen nystatin (MYCOSTATIN/NYSTOP) 100000 UNIT/GM POWD Apply to area three times daily as needed for rash 60 g 0  . Specialty Vitamins Products (MAGNESIUM, AMINO ACID CHELATE,) 133 MG tablet Take  1 tablet by mouth at bedtime.     . SUMAtriptan (IMITREX) 25 MG tablet as directed by prescriber take 1 tablet by mouth if needed for MIGRAINE or headache and may repeat in 2 hours IF headache PERSISTS OR RECURS 10 tablet 0  . terazosin (HYTRIN) 10 MG capsule take 1 capsule by mouth at bedtime 90 capsule 2  . tolterodine (DETROL) 1 MG tablet Take 1 tablet (1 mg total) by mouth 2 (two) times daily. 60 tablet 3  . torsemide (DEMADEX) 20 MG tablet Take 1 tablet (20 mg total) by mouth daily as needed. Edema 30 tablet 3   No current facility-administered medications on file prior to visit.    No Known Allergies  Past Medical History  Diagnosis Date  . Hypertension   . Dyslipidemia   . PAF (paroxysmal atrial fibrillation)     a. on  Pradaxa; b. CHADSVASc at least 2 (HTN & female)  . Diastolic dysfunction   . Morbid obesity   . Hypokalemia   . History of cardiac cath     a. 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 Asthma   . Rheumatoid arthritis(714.0)   . Basal cell carcinoma   . Hyperplastic colonic polyp 2003  . History of gout   . COPD (chronic obstructive pulmonary disease)   . Diastolic dysfunction     a. echo 07/2014: EF 55-60%, no RWMA, GR2DD, mild MR, LA moderately dilated, PASP 38 mm Hg  . Sleep apnea     a. not compliant with CPAP    Past Surgical History  Procedure Laterality Date  . Cardiac catheterization  05/24/2010    nonobstructive CAD  . Knee arthroscopy      bilateral  . Vaginal hysterectomy    . Cholecystectomy    . Cesarean section    . Colonoscopy  06/12/2011    Procedure: COLONOSCOPY;  Surgeon: Juanita Craver, MD;  Location: [facility];  Service: Endoscopy;  Laterality: N/A;  . Colonoscopy N/A 03/17/2013    Procedure: COLONOSCOPY;  Surgeon: Juanita Craver, MD;  Location: [facility];  Service: Endoscopy;  Laterality: N/A;    Family History  Problem Relation Age of Onset  . Tuberculosis Mother   . Parkinsonism Mother   . Diabetes type II Sister   . Breast cancer Sister   . Breast cancer Maternal Aunt     Social History   Social History  . Marital Status: Married    Spouse Name: N/A  . Number of Children: N/A  . Years of Education: N/A   Occupational History  . Not on file.   Social History Main Topics  . Smoking status: Never Smoker   . Smokeless tobacco: Never Used  . Alcohol Use: No  . Drug Use: No  . Sexual Activity: Not on file   Other Topics Concern  . Not on file   Social History Narrative   The PMH, PSH, Social History, Family History, Medications, and allergies have been reviewed in [facility], and have been updated if relevant.   Review of Systems  Constitutional: Negative.   HENT: Negative.   Respiratory: Positive for cough. Negative for shortness of  breath and stridor.   Cardiovascular: Negative.   Gastrointestinal: Negative.   Musculoskeletal: Negative.   Allergic/Immunologic: Negative.   Neurological: Negative.   Psychiatric/Behavioral: Positive for sleep disturbance.  All other systems reviewed and are negative.      Objective:    BP 136/62 mmHg  Pulse 69  Temp(Src) 97.9 F (36.6 C) (  Oral)  Wt 311 lb 12 oz (141.409 kg)  SpO2 92%   Physical Exam  Constitutional: She is oriented to person, place, and time. She appears well-developed and well-nourished. No distress.  HENT:  Head: Normocephalic and atraumatic.  Eyes: Conjunctivae are normal.  Neck: Normal range of motion.  Cardiovascular: Normal rate and regular rhythm.   Pulmonary/Chest: Effort normal and breath sounds normal.  Musculoskeletal: Normal range of motion. She exhibits no edema.  Neurological: She is alert and oriented to person, place, and time. No cranial nerve deficit.  Skin: Skin is warm and dry.  Psychiatric: She has a normal mood and affect. Her behavior is normal. Judgment and thought content normal.  Nursing note and vitals reviewed.         Assessment & Plan:   Essential hypertension  Chronic cough No Follow-up on file.

## 2014-11-10 NOTE — Assessment & Plan Note (Signed)
Deteriorated.  Increase Gabapentin to 300 mg four times daily.  She is aware of sedation potential. Call or return to clinic prn if these symptoms worsen or fail to improve as anticipated. The patient indicates understanding of these issues and agrees with the plan.

## 2014-11-10 NOTE — Assessment & Plan Note (Signed)
Well controlled with recent changes. Continue to monitor BP at home. No changes made today. The patient indicates understanding of these issues and agrees with the plan.

## 2014-11-10 NOTE — Assessment & Plan Note (Signed)
Improved off Lisinopril. NO changes made today.

## 2014-11-16 ENCOUNTER — Other Ambulatory Visit: Payer: Self-pay | Admitting: Cardiovascular Disease

## 2014-11-16 ENCOUNTER — Other Ambulatory Visit: Payer: Self-pay | Admitting: Family Medicine

## 2014-11-16 NOTE — Telephone Encounter (Signed)
Last f/u 10/2014 

## 2014-11-26 ENCOUNTER — Encounter: Payer: Self-pay | Admitting: Cardiovascular Disease

## 2014-11-26 ENCOUNTER — Ambulatory Visit (INDEPENDENT_AMBULATORY_CARE_PROVIDER_SITE_OTHER): Payer: Federal, State, Local not specified - PPO | Admitting: Cardiovascular Disease

## 2014-11-26 VITALS — BP 118/74 | HR 67 | Ht 64.0 in | Wt 320.5 lb

## 2014-11-26 DIAGNOSIS — I4891 Unspecified atrial fibrillation: Secondary | ICD-10-CM

## 2014-11-26 DIAGNOSIS — I10 Essential (primary) hypertension: Secondary | ICD-10-CM | POA: Diagnosis not present

## 2014-11-26 DIAGNOSIS — I480 Paroxysmal atrial fibrillation: Secondary | ICD-10-CM | POA: Diagnosis not present

## 2014-11-26 DIAGNOSIS — I2510 Atherosclerotic heart disease of native coronary artery without angina pectoris: Secondary | ICD-10-CM | POA: Diagnosis not present

## 2014-11-26 DIAGNOSIS — E785 Hyperlipidemia, unspecified: Secondary | ICD-10-CM

## 2014-11-26 NOTE — Patient Instructions (Addendum)
You are doing well. No medication changes were made.  Please hold the aspirin  Please call us if you have new issues that need to be addressed before your next appt.  Your physician wants you to follow-up in: 12 months.  You will receive a reminder letter in the mail two months in advance. If you don't receive a letter, please call our office to schedule the follow-up appointment.

## 2014-11-26 NOTE — Assessment & Plan Note (Signed)
Maintaining normal sinus rhythm. No changes to her medications 

## 2014-11-26 NOTE — Assessment & Plan Note (Signed)
Currently with no symptoms of angina. No further workup at this time. Continue current medication regimen. 

## 2014-11-26 NOTE — Assessment & Plan Note (Signed)
Blood pressure is well controlled on today's visit. No changes made to the medications. 

## 2014-11-26 NOTE — Assessment & Plan Note (Signed)
Cholesterol is at goal on the current lipid regimen. No changes to the medications were made.  

## 2014-11-26 NOTE — Progress Notes (Signed)
Patient ID: Tina Pacheco, female    DOB: Sep 14, 1950, 64 y.o.   MRN: [id number]  HPI Comments: Tina Pacheco is a 64 year old woman with morbid obesity, who was admitted to [facility] on May 23 2010 for paroxysmal atrial fibrillation, echocardiogram showing normal LV function with diastolic dysfunction, also with hyperlipidemia, hypertension, cardiac catheterization May 24 2010 showing mild nonobstructive coronary artery disease, history of asthma/COPD, suspected sleep apnea, rheumatoid arthritis who presents for routine followup of her atrial fibrillation.  episodes of atrial fibrillation were March and July 2012, October 2013. episode of atrial fibrillation 12/16/2011. She was started on diltiazem infusion and converted to normal sinus rhythm several hours later.   In follow-up, she reports that she is doing well. She denies any significant atrial fibrillation. She has been taking torsemide 2-3 times per week (previously was only taking this very sparingly) Denies having any leg edema Lisinopril was changed to Norvasc for cough. Denies any worsening leg edema on Norvasc Still with scant cough, possibly better.  Feel she needs to go to allergy specialist. Takes Flonase, Zyrtec Weight continues to be an issue  EKG on today's visit shows normal sinus rhythm with rate 67 bpm, no significant ST or T-wave changes  Other past medical history  fibromyalgia and rheumatoid arthritis  Family previously indicated that they thought that she has obstructive sleep apnea. She does not think that she can sleep with a CPAP. All of her atrial fibrillation episodes have occurred first thing in the morning prior to medications.   lab work showing total cholesterol 114, LDL 55    No Known Allergies  Outpatient Encounter Prescriptions as of 11/26/2014  Medication Sig  . albuterol (PROVENTIL HFA;VENTOLIN HFA) 108 (90 BASE) MCG/ACT inhaler Inhale 2 puffs into the lungs every 6 (six) hours as  needed. For shortness of breath  . amLODipine (NORVASC) 5 MG tablet Take 1 tablet (5 mg total) by mouth daily.  Marland Kitchen atorvastatin (LIPITOR) 20 MG tablet Take 1 tablet (20 mg total) by mouth daily.  . benzonatate (TESSALON) 200 MG capsule take 1 capsule by mouth twice a day if needed for cough  . budesonide-formoterol (SYMBICORT) 160-4.5 MCG/ACT inhaler Inhale 2 puffs into the lungs 2 (two) times daily as needed. For shortness of breath  . chlorpheniramine-HYDROcodone (TUSSIONEX PENNKINETIC ER) 10-8 MG/5ML SUER Take 5 mLs by mouth every 12 (twelve) hours as needed for cough.  . cyclobenzaprine (FLEXERIL) 5 MG tablet Take 1 tablet (5 mg total) by mouth 3 (three) times daily as needed for muscle spasms.  . dabigatran (PRADAXA) 150 MG CAPS capsule Take 1 capsule (150 mg total) by mouth 2 (two) times daily.  . Febuxostat (ULORIC PO) 40 mg daily. Take one by mouth daily, pt unsure of strength  . fluticasone (FLONASE) 50 MCG/ACT nasal spray Place 2 sprays into the nose daily as needed. For allergies   . gabapentin (NEURONTIN) 300 MG capsule Take 1 capsule (300 mg total) by mouth 4 (four) times daily.  . hydroxychloroquine (PLAQUENIL) 200 MG tablet Take by mouth 2 (two) times daily.  Marland Kitchen KLOR-CON M20 20 MEQ tablet TAKE 1/2 TABLET (=10MEQ)   TWICE A DAY; MAY TAKE AN   EXTRA TABLET WHEN TAKING   TORSEMIDE  . leflunomide (ARAVA) 20 MG tablet Take 20 mg by mouth daily.  Marland Kitchen loratadine (CLARITIN) 10 MG tablet Take 10 mg by mouth daily.  . metoprolol succinate (TOPROL-XL) 50 MG 24 hr tablet take 1 tablet by mouth once daily  . montelukast (SINGULAIR)  10 MG tablet take 1 tablet by mouth at bedtime  . Multiple Vitamin (MULTIVITAMIN) tablet Take 1 tablet by mouth daily.    Marland Kitchen nystatin (MYCOSTATIN/NYSTOP) 100000 UNIT/GM POWD Apply to area three times daily as needed for rash  . Specialty Vitamins Products (MAGNESIUM, AMINO ACID CHELATE,) 133 MG tablet Take 1 tablet by mouth at bedtime.   . SUMAtriptan (IMITREX) 25 MG  tablet as directed by prescriber take 1 tablet by mouth if needed for MIGRAINE or headache and may repeat in 2 hours IF headache PERSISTS OR RECURS  . terazosin (HYTRIN) 10 MG capsule take 1 capsule by mouth at bedtime  . tolterodine (DETROL) 1 MG tablet Take 1 tablet (1 mg total) by mouth 2 (two) times daily.  Marland Kitchen torsemide (DEMADEX) 20 MG tablet Take 1 tablet (20 mg total) by mouth daily as needed. Edema  . [DISCONTINUED] aspirin 81 MG chewable tablet Chew 1 tablet (81 mg total) by mouth daily.   No facility-administered encounter medications on file as of 11/26/2014.    Past Medical History  Diagnosis Date  . Hypertension   . Dyslipidemia   . PAF (paroxysmal atrial fibrillation)     a. on Pradaxa; b. CHADSVASc at least 2 (HTN & female)  . Diastolic dysfunction   . Morbid obesity   . Hypokalemia   . History of cardiac cath     a. 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 Asthma   . Rheumatoid arthritis(714.0)   . Basal cell carcinoma   . Hyperplastic colonic polyp 2003  . History of gout   . COPD (chronic obstructive pulmonary disease)   . Diastolic dysfunction     a. echo 07/2014: EF 55-60%, no RWMA, GR2DD, mild MR, LA moderately dilated, PASP 38 mm Hg  . Sleep apnea     a. not compliant with CPAP    Past Surgical History  Procedure Laterality Date  . Cardiac catheterization  05/24/2010    nonobstructive CAD  . Knee arthroscopy      bilateral  . Vaginal hysterectomy    . Cholecystectomy    . Cesarean section    . Colonoscopy  06/12/2011    Procedure: COLONOSCOPY;  Surgeon: Juanita Craver, MD;  Location: [facility];  Service: Endoscopy;  Laterality: N/A;  . Colonoscopy N/A 03/17/2013    Procedure: COLONOSCOPY;  Surgeon: Juanita Craver, MD;  Location: [facility];  Service: Endoscopy;  Laterality: N/A;    Social History  reports that she has never smoked. She has never used smokeless tobacco. She reports that she does not drink alcohol or use illicit drugs.  Family  History family history includes Breast cancer in her maternal aunt and sister; Diabetes type II in her sister; Parkinsonism in her mother; Tuberculosis in her mother.  Review of Systems  Constitutional: Positive for fatigue.  Respiratory: Positive for cough.   Cardiovascular: Negative.   Gastrointestinal: Negative.   Musculoskeletal: Positive for arthralgias and gait problem.  Neurological: Positive for weakness.  Hematological: Negative.   Psychiatric/Behavioral: Negative.   All other systems reviewed and are negative.   BP 118/74 mmHg  Pulse 67  Ht 5\' 4"  (1.626 m)  Wt 320 lb 8 oz (145.378 kg)  BMI 54.99 kg/m2  Physical Exam  Constitutional: She is oriented to person, place, and time. She appears well-developed and well-nourished.  HENT:  Head: Normocephalic.  Nose: Nose normal.  Mouth/Throat: Oropharynx is clear and moist.  Eyes: Conjunctivae are normal. Pupils are equal, round, and reactive to light.  Neck: Normal range of motion. Neck supple. No JVD present.  Cardiovascular: Normal rate, regular rhythm, S1 normal, S2 normal, normal heart sounds and intact distal pulses.  Exam reveals no gallop and no friction rub.   No murmur heard. Pulmonary/Chest: Effort normal and breath sounds normal. No respiratory distress. She has no wheezes. She has no rales. She exhibits no tenderness.  Abdominal: Soft. Bowel sounds are normal. She exhibits no distension. There is no tenderness.  Musculoskeletal: Normal range of motion. She exhibits no edema or tenderness.  Lymphadenopathy:    She has no cervical adenopathy.  Neurological: She is alert and oriented to person, place, and time. Coordination normal.  Skin: Skin is warm and dry. No rash noted. No erythema.  Psychiatric: She has a normal mood and affect. Her behavior is normal. Judgment and thought content normal.    Assessment and Plan  Nursing note and vitals reviewed.

## 2014-12-11 ENCOUNTER — Encounter: Payer: Self-pay | Admitting: Internal Medicine

## 2014-12-11 ENCOUNTER — Ambulatory Visit (INDEPENDENT_AMBULATORY_CARE_PROVIDER_SITE_OTHER): Payer: Federal, State, Local not specified - PPO | Admitting: Internal Medicine

## 2014-12-11 VITALS — BP 136/70 | HR 69 | Temp 98.5°F | Wt 320.0 lb

## 2014-12-11 DIAGNOSIS — R05 Cough: Secondary | ICD-10-CM | POA: Diagnosis not present

## 2014-12-11 DIAGNOSIS — R0982 Postnasal drip: Secondary | ICD-10-CM | POA: Diagnosis not present

## 2014-12-11 DIAGNOSIS — R059 Cough, unspecified: Secondary | ICD-10-CM

## 2014-12-11 MED ORDER — HYDROCODONE-HOMATROPINE 5-1.5 MG/5ML PO SYRP: 5.0000 mL | ORAL_SOLUTION | Freq: Three times a day (TID) | ORAL | Status: DC | PRN

## 2014-12-11 NOTE — Progress Notes (Signed)
Pre visit review using our clinic review tool, if applicable. No additional management support is needed unless otherwise documented below in the visit note. 

## 2014-12-11 NOTE — Patient Instructions (Signed)

## 2014-12-11 NOTE — Progress Notes (Signed)
HPI  Pt presents to the clinic today with c/o sore throat and cough. This started yesterday. She has noticed a post nasal drip and some difficulty swallowing. Her eyes have been watering. The cough in non productive. She denies chest pain or shortness of breath. She denies fever, chills or body aches. She did have a history of a chronic cough which improved after stopping Lisinopril. She does have a history of AFIB, OSA, asthma, COPD and CHF. She takes Symbicort, Singulair, Albuterol, Claritin and Flonase. She has not had sick contacts.  Review of Systems      Past Medical History  Diagnosis Date  . Hypertension   . Dyslipidemia   . PAF (paroxysmal atrial fibrillation) (HCC)     a. on Pradaxa; b. CHADSVASc at least 2 (HTN & female)  . Diastolic dysfunction   . Morbid obesity ([place])   . Hypokalemia   . History of cardiac cath     a. 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 Asthma   . Rheumatoid arthritis(714.0)   . Basal cell carcinoma   . Hyperplastic colonic polyp 2003  . History of gout   . COPD (chronic obstructive pulmonary disease) ([place])   . Diastolic dysfunction     a. echo 07/2014: EF 55-60%, no RWMA, GR2DD, mild MR, LA moderately dilated, PASP 38 mm Hg  . Sleep apnea     a. not compliant with CPAP    Family History  Problem Relation Age of Onset  . Tuberculosis Mother   . Parkinsonism Mother   . Diabetes type II Sister   . Breast cancer Sister   . Breast cancer Maternal Aunt     Social History   Social History  . Marital Status: Married    Spouse Name: N/A  . Number of Children: N/A  . Years of Education: N/A   Occupational History  . Not on file.   Social History Main Topics  . Smoking status: Never Smoker   . Smokeless tobacco: Never Used  . Alcohol Use: No  . Drug Use: No  . Sexual Activity: Not on file   Other Topics Concern  . Not on file   Social History Narrative    No Known Allergies   Constitutional: Denies headache, fatigue,  fever or abrupt weight changes.  HEENT:  Positive sore throat. Denies eye redness, eye pain, pressure behind the eyes, facial pain, nasal congestion, ear pain, ringing in the ears, wax buildup, runny nose or bloody nose. Respiratory: Positive cough. Denies difficulty breathing or shortness of breath.  Cardiovascular: Denies chest pain, chest tightness, palpitations or swelling in the hands or feet.   No other specific complaints in a complete review of systems (except as listed in HPI above).  Objective:  BP 136/70 mmHg  Pulse 69  Temp(Src) 98.5 F (36.9 C) (Oral)  Wt 320 lb (145.151 kg)  SpO2 96%  Wt Readings from Last 3 Encounters:  12/11/14 320 lb (145.151 kg)  11/26/14 320 lb 8 oz (145.378 kg)  11/10/14 311 lb 12 oz (141.409 kg)     General: Appears her stated age, in NAD. HEENT: Head: normal shape and size, no sinus tenderness noted; Eyes: sclera white, no icterus, conjunctiva pink; Ears: Tm's gray and intact, normal light reflex; Nose: mucosa pink and moist, septum midline; Throat/Mouth: + PND. Teeth present, mucosa erythematous and moist, no exudate noted, no lesions or ulcerations noted.  Neck: No cervical lymphadenopathy.  Cardiovascular: Normal rate and rhythm. S1,S2 noted.  No murmur,  rubs or gallops noted.  Pulmonary/Chest: Normal effort and positive vesicular breath sounds. No respiratory distress. No wheezes, rales or ronchi noted.      Assessment & Plan:   Cough secondary to post nasal drip:  Get some rest and drink plenty of water Continue Claritin, Singulair and Flonase Try adding in some Mucinex for the next few days Rx for Hycodan cough syrup Return precautions given  RTC as needed or if symptoms persist.

## 2014-12-15 ENCOUNTER — Encounter: Payer: Self-pay | Admitting: Family Medicine

## 2014-12-15 ENCOUNTER — Ambulatory Visit (INDEPENDENT_AMBULATORY_CARE_PROVIDER_SITE_OTHER): Payer: Federal, State, Local not specified - PPO | Admitting: Family Medicine

## 2014-12-15 VITALS — BP 120/72 | HR 76 | Temp 98.0°F | Wt 327.0 lb

## 2014-12-15 DIAGNOSIS — R05 Cough: Secondary | ICD-10-CM | POA: Diagnosis not present

## 2014-12-15 DIAGNOSIS — R059 Cough, unspecified: Secondary | ICD-10-CM | POA: Insufficient documentation

## 2014-12-15 DIAGNOSIS — R053 Chronic cough: Secondary | ICD-10-CM

## 2014-12-15 MED ORDER — HYDROCOD POLST-CPM POLST ER 10-8 MG/5ML PO SUER
5.0000 mL | Freq: Two times a day (BID) | ORAL | Status: DC | PRN
Start: 2014-12-15 — End: 2014-12-22

## 2014-12-15 NOTE — Progress Notes (Signed)
Pre visit review using our clinic review tool, if applicable. No additional management support is needed unless otherwise documented below in the visit note. 

## 2014-12-15 NOTE — Assessment & Plan Note (Signed)
Persistent.  Likely viral that has worsened her chronic cough. She is up 7 pounds- advised NOT to miss any demadex doses.  Lung exam reassuring- unlikely CHF exacerbation. Rx given for tussionex. Continue supportive measures. Call or return to clinic prn if these symptoms worsen or fail to improve as anticipated. The patient indicates understanding of these issues and agrees with the plan.

## 2014-12-15 NOTE — Progress Notes (Signed)
HPI  Pleasant 64 yo female well known to me with a history of chronic cough here for follow up of cough.  Saw [place] on 10/14 (4 days ago) - note reviewed.  At that time,  presented to the clinic today c/o sore throat and cough that started the day prior.  She also noticed a post nasal drip and some difficulty swallowing, along with watery and itchy eyes.  Cough is dry as usual.    Of note, her cough had improved some last month when we d/c'd lisinopril.  She also does have a history of AFIB, OSA, asthma, COPD and CHF. She takes Symbicort, Singulair, Albuterol, Claritin and Flonase. She has not had sick contacts.  Rollene Fare advised the following:   Cough secondary to post nasal drip:  Get some rest and drink plenty of water Continue Claritin, Singulair and Flonase Try adding in some Mucinex for the next few days Rx for Hycodan cough syrup Return precautions given  RTC as needed or if symptoms persist.   Feels a little better- less congested but cough is worse.  Feels hycodan is ineffective. Past Medical History  Diagnosis Date  . Hypertension   . Dyslipidemia   . PAF (paroxysmal atrial fibrillation) (HCC)     a. on Pradaxa; b. CHADSVASc at least 2 (HTN & female)  . Diastolic dysfunction   . Morbid obesity ([place])   . Hypokalemia   . History of cardiac cath     a. 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 Asthma   . Rheumatoid arthritis(714.0)   . Basal cell carcinoma   . Hyperplastic colonic polyp 2003  . History of gout   . COPD (chronic obstructive pulmonary disease) ([place])   . Diastolic dysfunction     a. echo 07/2014: EF 55-60%, no RWMA, GR2DD, mild MR, LA moderately dilated, PASP 38 mm Hg  . Sleep apnea     a. not compliant with CPAP    Family History  Problem Relation Age of Onset  . Tuberculosis Mother   . Parkinsonism Mother   . Diabetes type II Sister   . Breast cancer Sister   . Breast cancer Maternal Aunt     Social History   Social History  .  Marital Status: Married    Spouse Name: N/A  . Number of Children: N/A  . Years of Education: N/A   Occupational History  . Not on file.   Social History Main Topics  . Smoking status: Never Smoker   . Smokeless tobacco: Never Used  . Alcohol Use: No  . Drug Use: No  . Sexual Activity: Not on file   Other Topics Concern  . Not on file   Social History Narrative    No Known Allergies   Review of Systems  Constitutional: Negative.   HENT: Positive for congestion and postnasal drip. Negative for sinus pressure, sore throat and tinnitus.   Eyes: Negative.   Respiratory: Positive for cough and shortness of breath. Negative for wheezing and stridor.   Cardiovascular: Negative.   Gastrointestinal: Negative.   Endocrine: Negative.   Genitourinary: Negative.   Musculoskeletal: Negative.   Allergic/Immunologic: Negative.   Neurological: Negative.   Hematological: Negative.   Psychiatric/Behavioral: Negative.   All other systems reviewed and are negative.    Objective:  BP 120/72 mmHg  Pulse 76  Temp(Src) 98 F (36.7 C) (Oral)  Wt 327 lb (148.326 kg)  SpO2 95%  Wt Readings from Last 3 Encounters:  12/15/14 327  lb (148.326 kg)  12/11/14 320 lb (145.151 kg)  11/26/14 320 lb 8 oz (145.378 kg)   Physical Exam  Constitutional: She is oriented to person, place, and time and well-developed, well-nourished, and in no distress. No distress.  HENT:  Head: Normocephalic.  Eyes: Conjunctivae are normal.  Neck: Normal range of motion.  Cardiovascular: Normal rate and regular rhythm.   Pulmonary/Chest: Effort normal and breath sounds normal. No respiratory distress. She has no rales. She exhibits no tenderness.  Coughing during exam- dry, hacking  Musculoskeletal: Normal range of motion. She exhibits no edema.  Neurological: She is alert and oriented to person, place, and time.  Skin: Skin is warm and dry. She is not diaphoretic.  Psychiatric: Mood, memory, affect and  judgment normal.  Nursing note and vitals reviewed.          Assessment & Plan:

## 2014-12-21 ENCOUNTER — Other Ambulatory Visit: Payer: Self-pay | Admitting: Family Medicine

## 2014-12-21 NOTE — Telephone Encounter (Signed)
Last seen 10/18 for cough. Last refilled on that date, along with chlorpheniramine-HYDROcodone

## 2014-12-22 ENCOUNTER — Ambulatory Visit (INDEPENDENT_AMBULATORY_CARE_PROVIDER_SITE_OTHER)
Admission: RE | Admit: 2014-12-22 | Discharge: 2014-12-22 | Disposition: A | Discharge status: Home / Self Care | Payer: Federal, State, Local not specified - PPO | Source: Ambulatory Visit | Attending: Primary Care | Admitting: Primary Care

## 2014-12-22 ENCOUNTER — Encounter: Payer: Self-pay | Admitting: Primary Care

## 2014-12-22 ENCOUNTER — Ambulatory Visit (INDEPENDENT_AMBULATORY_CARE_PROVIDER_SITE_OTHER): Payer: Federal, State, Local not specified - PPO | Admitting: Primary Care

## 2014-12-22 VITALS — BP 130/70 | HR 68 | Temp 97.8°F | Ht 64.0 in | Wt 328.8 lb

## 2014-12-22 DIAGNOSIS — R059 Cough, unspecified: Secondary | ICD-10-CM

## 2014-12-22 DIAGNOSIS — R05 Cough: Secondary | ICD-10-CM

## 2014-12-22 DIAGNOSIS — R053 Chronic cough: Secondary | ICD-10-CM

## 2014-12-22 LAB — BASIC METABOLIC PANEL
BUN: 11 mg/dL (ref 6–23)
CO2: 29 mEq/L (ref 19–32)
Calcium: 10.6 mg/dL — ABNORMAL HIGH (ref 8.4–10.5)
Chloride: 105 mEq/L (ref 96–112)
Creatinine, Ser: 0.74 mg/dL (ref 0.40–1.20)
GFR: 83.75 mL/min (ref >60.00)
GLUCOSE: 115 mg/dL — AB (ref 70–99)
POTASSIUM: 4.2 meq/L (ref 3.5–5.1)
SODIUM: 140 meq/L (ref 135–145)

## 2014-12-22 LAB — CBC WITH DIFFERENTIAL/PLATELET
Basophils Absolute: 0 10*3/uL (ref 0.0–0.1)
Basophils Relative: 0.3 % (ref 0.0–3.0)
EOS PCT: 5.1 % — AB (ref 0.0–5.0)
Eosinophils Absolute: 0.4 10*3/uL (ref 0.0–0.7)
HEMATOCRIT: 36.4 % (ref 36.0–46.0)
HEMOGLOBIN: 11.7 g/dL — AB (ref 12.0–15.0)
LYMPHS ABS: 1.4 10*3/uL (ref 0.7–4.0)
Lymphocytes Relative: 16.4 % (ref 12.0–46.0)
MCHC: 32.2 g/dL (ref 30.0–36.0)
MCV: 81.8 fl (ref 78.0–100.0)
MONOS PCT: 6.3 % (ref 3.0–12.0)
Monocytes Absolute: 0.5 10*3/uL (ref 0.1–1.0)
Neutro Abs: 6 10*3/uL (ref 1.4–7.7)
Neutrophils Relative %: 71.9 % (ref 43.0–77.0)
Platelets: 280 10*3/uL (ref 150.0–400.0)
RBC: 4.45 Mil/uL (ref 3.87–5.11)
RDW: 16.7 % — ABNORMAL HIGH (ref 11.5–15.5)
WBC: 8.4 10*3/uL (ref 4.0–10.5)

## 2014-12-22 LAB — BRAIN NATRIURETIC PEPTIDE: Pro B Natriuretic peptide (BNP): 335 pg/mL — ABNORMAL HIGH (ref 0.0–100.0)

## 2014-12-22 MED ORDER — HYDROCOD POLST-CPM POLST ER 10-8 MG/5ML PO SUER: 5.0000 mL | Freq: Two times a day (BID) | ORAL | Status: DC | PRN

## 2014-12-22 NOTE — Patient Instructions (Addendum)
Complete xray(s) prior to leaving today. I will contact you regarding your results.  Complete lab work prior to leaving today. I will notify you of your results.  You may take the Tussionex every 12 hours as needed for cough.   I will be in touch later today. It was a pleasure to see you today!

## 2014-12-22 NOTE — Progress Notes (Signed)
Subjective:    Patient ID: Tina Pacheco, female    DOB: 1950/10/08, 64 y.o.   MRN: [id number]  HPI  Tina Pacheco is a 64 year old female who presents today with a chief complaint of cough. She was evaluated on 10/14 and again on 10/18 for same symptoms and determined to be viral in nature/related to postnasal drip (both notes reviewed). Her cough has been present since 10/13 but she has a history of chronic cough intermittently for months. She has a history of asthma, CHF, and obesity. She was provided with a prescription for Hycodan during her 10/14 visit which had no improvement in cough. She was provided with a script for Tussionex during her visit on 10/18 with improvement, which has now run out. Her cough continues and is non productive, although she feels congestion in her chest. She's taken Claritin, Mucinex, and Flonase for symptoms without relief. Denies fevers, chills, sore throat, body aches, nausea. Vitals signs in clinic stable.  Review of Systems  Constitutional: Negative for fever and chills.  HENT: Positive for postnasal drip and sinus pressure. Negative for congestion, ear discharge and sore throat.   Respiratory: Positive for cough and shortness of breath.   Cardiovascular: Negative for chest pain.  Gastrointestinal: Negative for nausea and vomiting.       Past Medical History  Diagnosis Date  . Hypertension   . Dyslipidemia   . PAF (paroxysmal atrial fibrillation) (HCC)     a. on Pradaxa; b. CHADSVASc at least 2 (HTN & female)  . Diastolic dysfunction   . Morbid obesity ([place])   . Hypokalemia   . History of cardiac cath     a. 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 Asthma   . Rheumatoid arthritis(714.0)   . Basal cell carcinoma   . Hyperplastic colonic polyp 2003  . History of gout   . COPD (chronic obstructive pulmonary disease) ([place])   . Diastolic dysfunction     a. echo 07/2014: EF 55-60%, no RWMA, GR2DD, mild MR, LA moderately dilated, PASP 38 mm  Hg  . Sleep apnea     a. not compliant with CPAP    Social History   Social History  . Marital Status: Married    Spouse Name: N/A  . Number of Children: N/A  . Years of Education: N/A   Occupational History  . Not on file.   Social History Main Topics  . Smoking status: Never Smoker   . Smokeless tobacco: Never Used  . Alcohol Use: No  . Drug Use: No  . Sexual Activity: Not on file   Other Topics Concern  . Not on file   Social History Narrative    Past Surgical History  Procedure Laterality Date  . Cardiac catheterization  05/24/2010    nonobstructive CAD  . Knee arthroscopy      bilateral  . Vaginal hysterectomy    . Cholecystectomy    . Cesarean section    . Colonoscopy  06/12/2011    Procedure: COLONOSCOPY;  Surgeon: Juanita Craver, MD;  Location: [facility];  Service: Endoscopy;  Laterality: N/A;  . Colonoscopy N/A 03/17/2013    Procedure: COLONOSCOPY;  Surgeon: Juanita Craver, MD;  Location: [facility];  Service: Endoscopy;  Laterality: N/A;    Family History  Problem Relation Age of Onset  . Tuberculosis Mother   . Parkinsonism Mother   . Diabetes type II Sister   . Breast cancer Sister   . Breast cancer Maternal Aunt  No Known Allergies  Current Outpatient Prescriptions on File Prior to Visit  Medication Sig Dispense Refill  . albuterol (PROVENTIL HFA;VENTOLIN HFA) 108 (90 BASE) MCG/ACT inhaler Inhale 2 puffs into the lungs every 6 (six) hours as needed. For shortness of breath 3 Inhaler 1  . amLODipine (NORVASC) 5 MG tablet Take 1 tablet (5 mg total) by mouth daily. 90 tablet 3  . atorvastatin (LIPITOR) 20 MG tablet Take 1 tablet (20 mg total) by mouth daily. 90 tablet 3  . benzonatate (TESSALON) 200 MG capsule take 1 capsule by mouth twice a day if needed for cough 30 capsule 0  . budesonide-formoterol (SYMBICORT) 160-4.5 MCG/ACT inhaler Inhale 2 puffs into the lungs 2 (two) times daily as needed. For shortness of breath 3 Inhaler 1  .  cyclobenzaprine (FLEXERIL) 5 MG tablet Take 1 tablet (5 mg total) by mouth 3 (three) times daily as needed for muscle spasms. 30 tablet 1  . dabigatran (PRADAXA) 150 MG CAPS capsule Take 1 capsule (150 mg total) by mouth 2 (two) times daily. 180 capsule 3  . Febuxostat (ULORIC PO) 40 mg daily. Take one by mouth daily, pt unsure of strength    . fluticasone (FLONASE) 50 MCG/ACT nasal spray Place 2 sprays into the nose daily as needed. For allergies     . gabapentin (NEURONTIN) 300 MG capsule Take 1 capsule (300 mg total) by mouth 4 (four) times daily. 120 capsule 3  . hydroxychloroquine (PLAQUENIL) 200 MG tablet Take by mouth 2 (two) times daily.    Marland Kitchen KLOR-CON M20 20 MEQ tablet TAKE 1/2 TABLET (=10MEQ)   TWICE A DAY; MAY TAKE AN   EXTRA TABLET WHEN TAKING   TORSEMIDE 180 tablet 3  . leflunomide (ARAVA) 20 MG tablet Take 20 mg by mouth daily.    Marland Kitchen loratadine (CLARITIN) 10 MG tablet Take 10 mg by mouth daily.    . metoprolol succinate (TOPROL-XL) 50 MG 24 hr tablet take 1 tablet by mouth once daily 30 tablet 3  . montelukast (SINGULAIR) 10 MG tablet take 1 tablet by mouth at bedtime 30 tablet 3  . Multiple Vitamin (MULTIVITAMIN) tablet Take 1 tablet by mouth daily.      Marland Kitchen nystatin (MYCOSTATIN/NYSTOP) 100000 UNIT/GM POWD Apply to area three times daily as needed for rash 60 g 0  . Specialty Vitamins Products (MAGNESIUM, AMINO ACID CHELATE,) 133 MG tablet Take 1 tablet by mouth at bedtime.     . SUMAtriptan (IMITREX) 25 MG tablet as directed by prescriber take 1 tablet by mouth if needed for MIGRAINE or headache and may repeat in 2 hours IF headache PERSISTS OR RECURS 10 tablet 0  . terazosin (HYTRIN) 10 MG capsule take 1 capsule by mouth at bedtime 90 capsule 2  . tolterodine (DETROL) 1 MG tablet Take 1 tablet (1 mg total) by mouth 2 (two) times daily. 60 tablet 3  . torsemide (DEMADEX) 20 MG tablet Take 1 tablet (20 mg total) by mouth daily as needed. Edema 30 tablet 3   No current  facility-administered medications on file prior to visit.    BP 130/70 mmHg  Pulse 68  Temp(Src) 97.8 F (36.6 C) (Oral)  Ht 5\' 4"  (1.626 m)  Wt 328 lb 12.8 oz (149.143 kg)  BMI 56.41 kg/m2  SpO2 98%     Objective:   Physical Exam  Constitutional: She appears well-nourished.  HENT:  Right Ear: Tympanic membrane and ear canal normal.  Left Ear: Tympanic membrane and ear canal normal.  Nose: Right sinus exhibits maxillary sinus tenderness. Right sinus exhibits no frontal sinus tenderness. Left sinus exhibits maxillary sinus tenderness. Left sinus exhibits no frontal sinus tenderness.  Mouth/Throat: Oropharynx is clear and moist.  Eyes: Conjunctivae are normal. Pupils are equal, round, and reactive to light.  Neck: Neck supple.  Cardiovascular: Normal rate and regular rhythm.   Pulmonary/Chest: Effort normal. She has no wheezes. She has rhonchi in the right lower field and the left lower field.  Lymphadenopathy:    She has no cervical adenopathy.  Skin: Skin is warm and dry.  Vitals reviewed.         Assessment & Plan:

## 2014-12-22 NOTE — Assessment & Plan Note (Signed)
Persistent cough. Mild rhonchi noted to bilateral bases of lungs. Will obtain chest xray to rule out pneumonia. Could be CHF exacerbation as she is up another 1.8 pounds since last visit. Will check BNP, CBC, BMP. She endorses compliance to her torsemide. RX for tussionex provided. Will hold off on antibiotics until chest xray and blood work has returned. If tests are normal, then will consider referral to pulmonary for repeat PFT's as this could be asthma related. Return precautions provided.

## 2014-12-22 NOTE — Progress Notes (Signed)
Pre visit review using our clinic review tool, if applicable. No additional management support is needed unless otherwise documented below in the visit note. 

## 2015-01-18 ENCOUNTER — Telehealth: Payer: Self-pay | Admitting: *Deleted

## 2015-01-18 ENCOUNTER — Encounter: Payer: Self-pay | Admitting: Family Medicine

## 2015-01-18 MED ORDER — OXYCODONE HCL 5 MG PO TABS: ORAL_TABLET | ORAL | Status: DC

## 2015-01-18 NOTE — Telephone Encounter (Signed)
Patient called requesting a refill on Oxycodone 5 mg, take two by mouth every 12 hours as need.  Medication is no longer on list. Patient stated that she last filled it on 10/12/14 #[id number]. Call patient when ready for pickup.

## 2015-01-18 NOTE — Telephone Encounter (Signed)
Spoke to pt and informed her Rx is available for pickup from the front desk. Pt advised third party unable to pickup 

## 2015-01-18 NOTE — Telephone Encounter (Signed)
Ok to refill- print rx and place in my box for signature please.

## 2015-01-18 NOTE — Telephone Encounter (Signed)
Rx printed per Dr Deborra Medina and placed in inbox for sig

## 2015-02-01 ENCOUNTER — Telehealth: Payer: Self-pay | Admitting: *Deleted

## 2015-02-01 NOTE — Telephone Encounter (Signed)
Spoke w/ pt.  She reports that she has had increased SOB and swelling. Normally takes torsemide 20 mg once daily, but has had to take BID w/ no relief.  Wt is up 6 lbs over the past 6-7 days. On discussion, pt does not follow low Na+ diet and does not restrict fluids, she thought that she needed to increase her fluid intake w/ torsemide.  Discussed at length w/ pt the purpose of her diuretic and the effects of fluids and Na+. She is appreciative of the call and will make diet changes, such as eating ice chips to avoid the volume of fluids. Advised her to keep her feet elevated as well, (she does not want to wear compression hose) and that I am mailing some educational material to her. She is appreciative and will call back if her sx do not improve w/ these changes.

## 2015-02-01 NOTE — Telephone Encounter (Signed)
Pt calling stating she is retaining a lot of fluid and having some SOB Pt c/o swelling: STAT is pt has developed SOB within 24 hours  1. How long have you been experiencing swelling? About 2 days been on and off  2. Where is the swelling located?  Legs and feet  3.  Are you currently taking a "fluid pill"? Yes   4.  Are you currently SOB? Yes   5.  Have you traveled recently? no  Pt c/o Shortness Of Breath: STAT if SOB developed within the last 24 hours or pt is noticeably SOB on the phone  1. Are you currently SOB (can you hear that pt is SOB on the phone)? No   2. How long have you been experiencing SOB? About a few days   3. Are you SOB when sitting or when up moving around? More when moving around but not just then  4. Are you currently experiencing any other symptoms? No nothing new.  Was told to call us when this happens again. This happened a few weeks ago. Took 2 fluid pills for 5 days and she did that a few times. But as soon as she stops that, it starts to go back up.

## 2015-02-11 ENCOUNTER — Encounter: Payer: Self-pay | Admitting: Family Medicine

## 2015-02-11 DIAGNOSIS — J340 Abscess, furuncle and carbuncle of nose: Secondary | ICD-10-CM | POA: Diagnosis not present

## 2015-02-11 DIAGNOSIS — J301 Allergic rhinitis due to pollen: Secondary | ICD-10-CM | POA: Diagnosis not present

## 2015-02-11 DIAGNOSIS — K219 Gastro-esophageal reflux disease without esophagitis: Secondary | ICD-10-CM | POA: Diagnosis not present

## 2015-02-17 ENCOUNTER — Other Ambulatory Visit: Payer: Self-pay | Admitting: Family Medicine

## 2015-02-23 ENCOUNTER — Ambulatory Visit (INDEPENDENT_AMBULATORY_CARE_PROVIDER_SITE_OTHER): Payer: Medicare Other | Admitting: Family Medicine

## 2015-02-23 VITALS — BP 138/70 | HR 70 | Temp 98.0°F | Wt 334.5 lb

## 2015-02-23 DIAGNOSIS — R053 Chronic cough: Secondary | ICD-10-CM

## 2015-02-23 DIAGNOSIS — R059 Cough, unspecified: Secondary | ICD-10-CM

## 2015-02-23 DIAGNOSIS — R05 Cough: Secondary | ICD-10-CM | POA: Diagnosis not present

## 2015-02-23 MED ORDER — TORSEMIDE 20 MG PO TABS: 20.0000 mg | ORAL_TABLET | Freq: Two times a day (BID) | ORAL | Status: DC

## 2015-02-23 MED ORDER — HYDROCOD POLST-CPM POLST ER 10-8 MG/5ML PO SUER: 5.0000 mL | Freq: Two times a day (BID) | ORAL | Status: DC | PRN

## 2015-02-23 MED ORDER — BUDESONIDE-FORMOTEROL FUMARATE 160-4.5 MCG/ACT IN AERO: 2.0000 | INHALATION_SPRAY | Freq: Two times a day (BID) | RESPIRATORY_TRACT | Status: DC | PRN

## 2015-02-23 MED ORDER — ALBUTEROL SULFATE HFA 108 (90 BASE) MCG/ACT IN AERS: 2.0000 | INHALATION_SPRAY | Freq: Four times a day (QID) | RESPIRATORY_TRACT | Status: DC | PRN

## 2015-02-23 NOTE — Progress Notes (Signed)
Pre visit review using our clinic review tool, if applicable. No additional management support is needed unless otherwise documented below in the visit note. 

## 2015-02-23 NOTE — Progress Notes (Signed)
HPI  Tina Pacheco 64 yo female well known to me with a history of chronic cough here for follow up of cough.  Chronic cough-  Intermittent, often associated with  post nasal drip and some difficulty swallowing, along with watery and itchy eyes.  Cough is dry as usual.    Of note, her cough had improved some since when we d/c'd lisinopril.  She also does have a history of AFIB, OSA, asthma, COPD and CHF. She takes Symbicort, Singulair, Albuterol, Claritin and Flonase. She has not had sick contacts.  Rollene Fare advised the following:  H/o coughing fits that result in post tussive emesis.  Refill her cough suppressant with codeine quite regularly.  Cough has deteriorated over past 2 weeks, has also gained significant weight. Called her cardiologist, Dr. Rockey Situ, advised to take two torsemide which she has been doing.  Has been to ENT- allergy testing done.  Was told she has some slight swelling in throat.  Having more SOB. Wt Readings from Last 3 Encounters:  02/23/15 334 lb 8 oz (151.728 kg)  12/22/14 328 lb 12.8 oz (149.143 kg)  12/15/14 327 lb (148.326 kg)     Past Medical History  Diagnosis Date  . Hypertension   . Dyslipidemia   . PAF (paroxysmal atrial fibrillation) (HCC)     a. on Pradaxa; b. CHADSVASc at least 2 (HTN & female)  . Diastolic dysfunction   . Morbid obesity ([place])   . Hypokalemia   . History of cardiac cath     a. 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 Asthma   . Rheumatoid arthritis(714.0)   . Basal cell carcinoma   . Hyperplastic colonic polyp 2003  . History of gout   . COPD (chronic obstructive pulmonary disease) ([place])   . Diastolic dysfunction     a. echo 07/2014: EF 55-60%, no RWMA, GR2DD, mild MR, LA moderately dilated, PASP 38 mm Hg  . Sleep apnea     a. not compliant with CPAP    Family History  Problem Relation Age of Onset  . Tuberculosis Mother   . Parkinsonism Mother   . Diabetes type II Sister   . Breast cancer Sister   . Breast  cancer Maternal Aunt     Social History   Social History  . Marital Status: Married    Spouse Name: N/A  . Number of Children: N/A  . Years of Education: N/A   Occupational History  . Not on file.   Social History Main Topics  . Smoking status: Never Smoker   . Smokeless tobacco: Never Used  . Alcohol Use: No  . Drug Use: No  . Sexual Activity: Not on file   Other Topics Concern  . Not on file   Social History Narrative    No Known Allergies   Review of Systems  Constitutional: Negative.   HENT: Positive for congestion and postnasal drip. Negative for sinus pressure, sore throat and tinnitus.   Eyes: Negative.   Respiratory: Positive for cough and shortness of breath. Negative for wheezing and stridor.   Cardiovascular: Negative.   Gastrointestinal: Negative.   Endocrine: Negative.   Genitourinary: Negative.   Musculoskeletal: Negative.   Allergic/Immunologic: Negative.   Neurological: Negative.   Hematological: Negative.   Psychiatric/Behavioral: Negative.   All other systems reviewed and are negative.    Objective:  BP 138/70 mmHg  Pulse 70  Temp(Src) 98 F (36.7 C) (Oral)  Wt 334 lb 8 oz (151.728 kg)  SpO2 94%  Wt Readings from Last 3 Encounters:  02/23/15 334 lb 8 oz (151.728 kg)  12/22/14 328 lb 12.8 oz (149.143 kg)  12/15/14 327 lb (148.326 kg)   Physical Exam  Constitutional: She is oriented to person, place, and time and well-developed, well-nourished, and in no distress. No distress.  HENT:  Head: Normocephalic.  Eyes: Conjunctivae are normal.  Neck: Normal range of motion.  Cardiovascular: Normal rate and regular rhythm.   Pulmonary/Chest: Effort normal and breath sounds normal. No respiratory distress. She has no rales. She exhibits no tenderness.  Coughing during exam- dry, hacking  Musculoskeletal: Normal range of motion. She exhibits no edema.  Neurological: She is alert and oriented to person, place, and time.  Skin: Skin is warm  and dry. She is not diaphoretic.  Psychiatric: Mood, memory, affect and judgment normal.  Nursing note and vitals reviewed.          Assessment & Plan:

## 2015-02-23 NOTE — Assessment & Plan Note (Addendum)
Persistent issue- refer to pulmonary. The patient indicates understanding of these issues and agrees with the plan. Referral placed today. Advised to restart inhalers and tussionex rx refilled as well today. Advised to also follow up with Dr. Rockey Situ since there likely is some component of fluid overload however she is already taking increased dose of torsemide.

## 2015-02-26 ENCOUNTER — Ambulatory Visit (INDEPENDENT_AMBULATORY_CARE_PROVIDER_SITE_OTHER): Payer: Medicare Other | Admitting: Internal Medicine

## 2015-02-26 ENCOUNTER — Ambulatory Visit (INDEPENDENT_AMBULATORY_CARE_PROVIDER_SITE_OTHER)
Admission: RE | Admit: 2015-02-26 | Discharge: 2015-02-26 | Disposition: A | Discharge status: Home / Self Care | Payer: Medicare Other | Source: Ambulatory Visit | Attending: Internal Medicine | Admitting: Internal Medicine

## 2015-02-26 ENCOUNTER — Encounter: Payer: Self-pay | Admitting: Internal Medicine

## 2015-02-26 VITALS — BP 130/80 | HR 83 | Ht 64.0 in | Wt 333.6 lb

## 2015-02-26 DIAGNOSIS — R05 Cough: Secondary | ICD-10-CM

## 2015-02-26 DIAGNOSIS — R058 Other specified cough: Secondary | ICD-10-CM

## 2015-02-26 DIAGNOSIS — J45991 Cough variant asthma: Secondary | ICD-10-CM | POA: Insufficient documentation

## 2015-02-26 MED ORDER — MOMETASONE FURO-FORMOTEROL FUM 100-5 MCG/ACT IN AERO: INHALATION_SPRAY | RESPIRATORY_TRACT | Status: DC

## 2015-02-26 MED ORDER — FAMOTIDINE 20 MG PO TABS: ORAL_TABLET | ORAL | Status: DC

## 2015-02-26 MED ORDER — PREDNISONE 10 MG PO TABS: ORAL_TABLET | ORAL | Status: DC

## 2015-02-26 MED ORDER — TRAMADOL HCL 50 MG PO TABS: ORAL_TABLET | ORAL | Status: DC

## 2015-02-26 NOTE — Patient Instructions (Addendum)
Pantoprazole 40 mg  Take  30-60 min before first meal of the day and Pepcid (famotidine)  20 mg one @  bedtime until return to office - this is the best way to tell whether stomach acid is contributing to your problem.    GERD (REFLUX)  is an extremely common cause of respiratory symptoms just like yours , many times with no obvious heartburn at all.    It can be treated with medication, but also with lifestyle changes including elevation of the head of your bed (ideally with 6 inch  bed blocks),  Smoking cessation, avoidance of late meals, excessive alcohol, and avoid fatty foods, chocolate, peppermint, colas, red wine, and acidic juices such as orange juice.  NO MINT OR MENTHOL PRODUCTS SO NO COUGH DROPS  USE SUGARLESS CANDY INSTEAD (Jolley ranchers or Stover's or Life Savers) or even ice chips will also do - the key is to swallow to prevent all throat clearing. NO OIL BASED VITAMINS - use powdered substitutes.  Prednisone 10 mg take  4 each am x 2 days,   2 each am x 2 days,  1 each am x 2 days and stop    Start on Jan 2>> dulera 100 Take 2 puffs first thing in am and then another 2 puffs about 12 hours later.   Work on inhaler technique:  relax and gently blow all the way out then take a nice smooth deep breath back in, triggering the inhaler at same time you start breathing in.  Hold for up to 5 seconds if you can. Blow out thru nose. Rinse and gargle with water when done  Take delsym two tsp every 12 hours and supplement if needed with  tramadol 50 mg up to 2 every 4 hours to suppress the urge to cough. Swallowing water or using ice chips/non mint and menthol containing candies (such as lifesavers or sugarless jolly ranchers) are also effective.  You should rest your voice and avoid activities that you know make you cough.  Once you have eliminated the cough for 3 straight days try reducing the tramadol first,  then the delsym as tolerated.    Please remember to go to the   x-ray department  downstairs for your tests - we will call you with the results when they are available.  We need your allergist note to complete the work up   Please schedule a follow up office visit in 3 weeks, sooner if needed > bring all medications with you

## 2015-02-26 NOTE — Progress Notes (Signed)
Subjective:    Patient ID: Tina Pacheco, female    DOB: 09-25-1950,     MRN: [id number]  HPI  36 yowf never smoker with tendency to recurrent sinus infections> going into chest x 1990 with recurrence in Sept 2016 and added   symbicort and singulair and still needed tussionex until first week in November 2016 better so continued singulair daily but changed  symbicort to prn but then bad chest  tightness starting mid December assoc with severe dry cough>  no better with symbicort 160 maint (though hfa poor) so referred 02/26/2015 to pulmonary clinic by Dr Marjory Lies.   02/26/2015 1st [facility] office visit/ Michaell Grider   Chief Complaint  Patient presents with  . Pulmonary Consult    Referred by Dr. Marjory Lies. Pt c/o difficulty breathing "for years"- worse x 2 wks. She c/o chest pressure, non prod cough and DOE with very minimal exertion x 2 wks. She also c/o swelling in her legs.   cough is dry with sensation of something stuck in throat worse with activity / no change with perfume exp or eating/ assoc with sense of nasal congestion, sore throat and dysphagia worse day than noct   No obvious other patterns in day to day or daytime variabilty or assoc excess/ purulent sputum or mucus plugs   or cp or chest tightness, subjective wheeze overt sinus or hb symptoms. No unusual exp hx or h/o childhood pna/ asthma or knowledge of premature birth.  Sleeping ok without nocturnal  or early am exacerbation  of respiratory  c/o's or need for noct saba. Also denies any obvious fluctuation of symptoms with weather or environmental changes or other aggravating or alleviating factors except as outlined above   Current Medications, Allergies, Complete Past Medical History, Past Surgical History, Family History, and Social History were reviewed in [employer] record.           Review of Systems  Constitutional: Negative for fever, chills and unexpected weight change.  HENT: Positive for  sore throat and trouble swallowing. Negative for congestion, dental problem, ear pain, nosebleeds, postnasal drip, rhinorrhea, sinus pressure, sneezing and voice change.   Eyes: Negative for visual disturbance.  Respiratory: Positive for cough and shortness of breath. Negative for choking.   Cardiovascular: Positive for chest pain and leg swelling.  Gastrointestinal: Negative for vomiting, abdominal pain and diarrhea.  Genitourinary: Negative for difficulty urinating.  Musculoskeletal: Negative for arthralgias.  Skin: Negative for rash.  Neurological: Negative for tremors, syncope and headaches.  Hematological: Does not bruise/bleed easily.       Objective:   Physical Exam Massively obese amb wf could not climb on exam table with very harsh upper airway pattern dry coughing fits  Wt Readings from Last 3 Encounters:  02/26/15 333 lb 9.6 oz (151.32 kg)  02/23/15 334 lb 8 oz (151.728 kg)  12/22/14 328 lb 12.8 oz (149.143 kg)    Vital signs reviewed  HEENT: nl dentition, turbinates, and oropharynx. Nl external ear canals without cough reflex   NECK :  without JVD/Nodes/TM/ nl carotid upstrokes bilaterally   LUNGS: no acc muscle use,  Nl contour chest which is clear to A and P bilaterally without cough on insp or exp maneuvers - distant bs bilateraly  pseudodwheeze mostly   CV:  RRR  no s3 or murmur or increase in P2,  Trace bilateral pitting lower ext  edema   ABD:  Massive but soft and nontender   No bruits or organomegaly,  bowel sounds nl  MS:  Nl gait/ ext warm without deformities, calf tenderness, cyanosis or clubbing No obvious joint restrictions   SKIN: warm and dry without lesions    NEURO:  alert, approp, nl sensorium with  no motor deficits    CXR PA and Lateral:   02/26/2015 :    I personally reviewed images and agree with radiology impression as follows:   Progressive pulmonary interstitial thickening. Although this could be related to the history of asthma/  COPD, mild pulmonary venous congestion is suspected.  Left lower lobe aeration is improved. Suspect mild residual pleural parenchymal scarring. No well-defined residual consolidation.  degraded lateral view.     Assessment & Plan:

## 2015-02-27 ENCOUNTER — Encounter: Payer: Self-pay | Admitting: Internal Medicine

## 2015-02-27 NOTE — Assessment & Plan Note (Addendum)
The most common causes of chronic cough in immunocompetent adults include the following: upper airway cough syndrome (UACS), previously referred to as postnasal drip syndrome (PNDS), which is caused by variety of rhinosinus conditions; (2) asthma; (3) GERD; (4) chronic bronchitis from cigarette smoking or other inhaled environmental irritants; (5) nonasthmatic eosinophilic bronchitis; and (6) bronchiectasis.   These conditions, singly or in combination, have accounted for up to 94% of the causes of chronic cough in prospective studies.   Other conditions have constituted no >6% of the causes in prospective studies These have included bronchogenic carcinoma, chronic interstitial pneumonia, sarcoidosis, left ventricular failure, ACEI-induced cough, and aspiration from a condition associated with pharyngeal dysfunction.    Chronic cough is often simultaneously caused by more than one condition. A single cause has been found from 38 to 82% of the time, multiple causes from 18 to 62%. Multiple caused cough has been the result of three diseases up to 42% of the time.       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   These are the same pts (now being labeled as having "irritable larynx syndrome" by some cough centers) who not infrequently have a history of having failed to tolerate ace inhibitors,  dry powder inhalers or biphosphonates or report having atypical reflux symptoms that don't respond to standard doses of PPI , and  are easily confused as having aecopd or asthma flares by even experienced allergists/ pulmonologists.   The first step is to maximize acid suppression / GERD rx and eliminate cyclical coughing then regroup in 2 weeks  As I can't be sure she doesn't have a component of cough variant asthma rec reduce ics to dulera 100 2bid  I had an extended discussion with the patient reviewing all relevant studies completed to date and  lasting 35 min  1)   - The proper method of use, as well as anticipated side effects, of a metered-dose inhaler are discussed and demonstrated to the patient. Improved effectiveness after extensive coaching during this visit to a level of approximately 50 % from a baseline of 25%    2) Each maintenance medication was reviewed in detail including most importantly the difference between maintenance and prns and under what circumstances the prns are to be triggered using an action plan format that is not reflected in the computer generated alphabetically organized AVS.    Please see instructions for details which were reviewed in writing and the patient given a copy highlighting the part that I personally wrote and discussed at today's ov.   See instructions for specific recommendations which were reviewed directly with the patient who was given a copy with highlighter outlining the key components.

## 2015-02-27 NOTE — Assessment & Plan Note (Signed)
Body mass index is 57.23 kg/(m^2).  Lab Results  Component Value Date   TSH 0.65 07/06/2014     Contributing to gerd tendency/ doe/reviewed the need and the process to achieve and maintain neg calorie balance > defer f/u primary care including intermittently monitoring thyroid status

## 2015-03-09 ENCOUNTER — Other Ambulatory Visit: Payer: Self-pay | Admitting: Family Medicine

## 2015-03-11 ENCOUNTER — Encounter: Payer: Self-pay | Admitting: Internal Medicine

## 2015-03-21 ENCOUNTER — Other Ambulatory Visit: Payer: Self-pay | Admitting: Cardiovascular Disease

## 2015-03-22 ENCOUNTER — Ambulatory Visit (INDEPENDENT_AMBULATORY_CARE_PROVIDER_SITE_OTHER): Payer: Medicare Other | Admitting: Internal Medicine

## 2015-03-22 ENCOUNTER — Encounter: Payer: Self-pay | Admitting: Internal Medicine

## 2015-03-22 VITALS — BP 134/84 | HR 91 | Ht 64.5 in | Wt 312.6 lb

## 2015-03-22 DIAGNOSIS — R058 Other specified cough: Secondary | ICD-10-CM

## 2015-03-22 DIAGNOSIS — R05 Cough: Secondary | ICD-10-CM

## 2015-03-22 MED ORDER — TRAMADOL HCL 50 MG PO TABS: ORAL_TABLET | ORAL | Status: DC

## 2015-03-22 MED ORDER — MOMETASONE FURO-FORMOTEROL FUM 100-5 MCG/ACT IN AERO: INHALATION_SPRAY | RESPIRATORY_TRACT | Status: DC

## 2015-03-22 MED ORDER — PREDNISONE 10 MG PO TABS: ORAL_TABLET | ORAL | Status: DC

## 2015-03-22 NOTE — Patient Instructions (Signed)
GERD (REFLUX)  is an extremely common cause of respiratory symptoms just like yours , many times with no obvious heartburn at all.    It can be treated with medication, but also with lifestyle changes including elevation of the head of your bed (ideally with 6 inch  bed blocks),  Smoking cessation, avoidance of late meals, excessive alcohol, and avoid fatty foods, chocolate, peppermint, colas, red wine, and acidic juices such as orange juice.  NO MINT OR MENTHOL PRODUCTS SO NO COUGH DROPS  USE SUGARLESS CANDY INSTEAD (Jolley ranchers or Stover's or Life Savers) or even ice chips will also do - the key is to swallow to prevent all throat clearing. NO OIL BASED VITAMINS - use powdered substitutes.  Prednisone 10 mg take  4 each am x 2 days,   2 each am x 2 days,  1 each am x 2 days and stop    Start on Jan 27 >> dulera 100 Take 2 puffs first thing in am and then another 2 puffs about 12 hours later.   For drainage / throat tickle try take CHLORPHENIRAMINE  4 mg - take one every 4 hours as needed - available over the counter- may cause drowsiness so start with just a bedtime dose or two and see how you tolerate it before trying in daytime    Work on inhaler technique:  relax and gently blow all the way out then take a nice smooth deep breath back in, triggering the inhaler at same time you start breathing in.  Hold for up to 5 seconds if you can. Blow out thru nose. Rinse and gargle with water when done  Take delsym two tsp every 12 hours and supplement if needed with  tramadol 50 mg up to 2 every 4 hours to suppress the urge to cough. Swallowing water or using ice chips/non mint and menthol containing candies (such as lifesavers or sugarless jolly ranchers) are also effective.  You should rest your voice and avoid activities that you know make you cough.  Once you have eliminated the cough for 3 straight days try reducing the tramadol first,  then the delsym as tolerated.    See Tammy NP w/in 2  weeks(or first available)  with all your medications, even over the counter meds, separated in two separate bags, the ones you take no matter what vs the ones you stop once you feel better and take only as needed when you feel you need them.   Tammy  will generate for you a new user friendly medication calendar that will put [place] all on the same page re: your medication use.     Without this process, it simply isn't possible to assure that we are providing  your outpatient care  with  the attention to detail we feel you deserve.   If we cannot assure that you're getting that kind of care,  then we cannot manage your problem effectively from this clinic.  Once you have seen Tammy and we are sure that we're all on the same page with your medication use she will arrange follow up with me.

## 2015-03-22 NOTE — Progress Notes (Signed)
Subjective:    Patient ID: Tina Pacheco, female    DOB: 1950-11-22,     MRN: [id number]    Brief patient profile:  62 yowf never smoker with tendency to recurrent sinus infections> going into chest x 1990 with recurrence in Sept 2016 and added   symbicort and singulair and still needed tussionex until first week in November 2016 better so continued singulair daily but changed  symbicort to prn but then bad chest  tightness starting mid December assoc with severe dry cough>  no better with symbicort 160 maint (though hfa poor) so referred 02/26/2015 to pulmonary clinic by Dr Marjory Lies.    History of Present Illness  02/26/2015 1st Tina Pacheco   Chief Complaint  Patient presents with  . Pulmonary Consult    Referred by Dr. Marjory Lies. Pt c/o difficulty breathing "for years"- worse x 2 wks. She c/o chest pressure, non prod cough and DOE with very minimal exertion x 2 wks. She also c/o swelling in her legs.   cough is dry with sensation of something stuck in throat worse with activity / no change with perfume exp or eating/ assoc with sense of nasal congestion, sore throat and dysphagia worse day than noct  rec Pantoprazole 40 mg  Take  30-60 min before first meal of the day and Pepcid (famotidine)  20 mg one @  bedtime until return to office   GERD  Diet  Prednisone 10 mg take  4 each am x 2 days,   2 each am x 2 days,  1 each am x 2 days and stop   Start on Jan 2>> dulera 100 Take 2 puffs first thing in am and then another 2 puffs about 12 hours later.  Work on inhaler technique:    Take delsym two tsp every 12 hours and supplement if needed with  tramadol 50 mg up to 2 every 4 hours   Please schedule a follow up office visit in 3 weeks, sooner if needed > bring all medications with you     03/22/2015  f/u ov/Tina Pacheco re: recurrent cough since 10/2014  did not bring all meds / did not stay on dulera  Chief Complaint  Patient presents with  . Follow-up    Cough had  completely resovled 1 wk after last visit, until a few days ago started sneezing and coughing again. Her breathing has improved. She has not needed albuterol since the last visit.   not clear she really improved s need for tramadol before recurred, confused with maint vs prns  No obvious day to day or daytime variability or assoc excess/ purulent sputum or mucus plugs or hemoptysis or cp or chest tightness, subjective wheeze or overt sinus or hb symptoms. No unusual exp hx or h/o childhood pna/ asthma or knowledge of premature birth.  Sleeping ok without nocturnal  or early am exacerbation  of respiratory  c/o's or need for noct saba. Also denies any obvious fluctuation of symptoms with weather or environmental changes or other aggravating or alleviating factors except as outlined above   Current Medications, Allergies, Complete Past Medical History, Past Surgical History, Family History, and Social History were reviewed in [employer] [facility] record.  ROS  The following are not active complaints unless bolded sore throat, dysphagia, dental problems, itching, sneezing,  nasal congestion or excess/ purulent secretions, ear ache,   fever, chills, sweats, unintended wt loss, classically pleuritic or exertional cp,  orthopnea pnd or leg  swelling, presyncope, palpitations, abdominal pain, anorexia, nausea, vomiting, diarrhea  or change in bowel or bladder habits, change in stools or urine, dysuria,hematuria,  rash, arthralgias, visual complaints, headache, numbness, weakness or ataxia or problems with walking or coordination,  change in mood/affect or memory.                     .         Objective:   Physical Exam    Massively obese amb wf could not climb on exam table with very harsh upper airway pattern dry coughing fits  03/22/2015        313   02/26/15 333 lb 9.6 oz (151.32 kg)  02/23/15 334 lb 8 oz (151.728 kg)  12/22/14 328 lb 12.8 oz (149.143 kg)    Vital signs  reviewed  HEENT: nl dentition, turbinates, and oropharynx. Nl external ear canals without cough reflex   NECK :  without JVD/Nodes/TM/ nl carotid upstrokes bilaterally   LUNGS: no acc muscle use,  Nl contour chest which is clear to A and P bilaterally without cough on insp or exp maneuvers - distant bs bilateraly  pseudodwheeze mostly   CV:  RRR  no s3 or murmur or increase in P2,  Trace bilateral pitting lower ext  edema   ABD:  Massive but soft and nontender   No bruits or organomegaly, bowel sounds nl  MS:  Nl gait/ ext warm without deformities, calf tenderness, cyanosis or clubbing No obvious joint restrictions   SKIN: warm and dry without lesions    NEURO:  alert, approp, nl sensorium with  no motor deficits    CXR PA and Lateral:   02/26/2015 :    I personally reviewed images and agree with radiology impression as follows:   Progressive pulmonary interstitial thickening. Although this could be related to the history of asthma/ COPD, mild pulmonary venous congestion is suspected.  Left lower lobe aeration is improved. Suspect mild residual pleural parenchymal scarring. No well-defined residual consolidation.      Assessment & Plan:

## 2015-03-25 ENCOUNTER — Ambulatory Visit: Payer: Federal, State, Local not specified - PPO | Admitting: Pulmonary Disease

## 2015-03-28 NOTE — Assessment & Plan Note (Addendum)
The standardized cough guidelines published in Chest by Lissa Morales in 2006 are still the best available and consist of a multiple step process (up to 12!) , not a single office visit,  and are intended  to address this problem logically,  with an alogrithm dependent on response to empiric treatment at  each progressive step  to determine a specific diagnosis with  minimal addtional testing needed. Therefore if adherence is an issue or can't be accurately verified,  it's very unlikely the standard evaluation and treatment will be successful here.    Furthermore, response to therapy (other than acute cough suppression, which should only be used short term with avoidance of narcotic containing cough syrups if possible), can be a gradual process for which the patient may perceive immediate benefit.  Unlike going to an eye doctor where the best perscription is almost always the first one and is immediately effective, this is almost never the case in the management of chronic cough syndromes. Therefore the patient needs to commit up front to consistently adhere to recommendations  for up to 6 weeks of therapy directed at the likely underlying problem(s) before the response can be reasonably evaluated.   Key is staying on empirical  rx long enough to confirm she's taking it correctly and next step if she really is = methacholine challenge   To keep things simple, I have asked the patient to first separate medicines that are perceived as maintenance, that is to be taken daily "no matter what", from those medicines that are taken on only on an as-needed basis and I have given the patient examples of both, and then return to see our NP to generate a  detailed  medication calendar which should be followed until the next physician sees the patient and updates it.    - The proper method of use, as well as anticipated side effects, of a metered-dose inhaler are discussed and demonstrated to the patient. Improved  effectiveness after extensive coaching during this visit to a level of approximately 75 % from a baseline of 50 %  > try restart dulera 100 2bid   I had an extended discussion with the patient reviewing all relevant studies completed to date and  lasting 15 to 20 minutes of a 25 minute visit    Each maintenance medication was reviewed in detail including most importantly the difference between maintenance and prns and under what circumstances the prns are to be triggered using an action plan format that is not reflected in the computer generated alphabetically organized AVS.    Please see instructions for details which were reviewed in writing and the patient given a copy highlighting the part that I personally wrote and discussed at today's ov.

## 2015-03-29 DIAGNOSIS — Z79899 Other long term (current) drug therapy: Secondary | ICD-10-CM | POA: Diagnosis not present

## 2015-03-29 DIAGNOSIS — M1A09X0 Idiopathic chronic gout, multiple sites, without tophus (tophi): Secondary | ICD-10-CM | POA: Diagnosis not present

## 2015-03-29 DIAGNOSIS — M797 Fibromyalgia: Secondary | ICD-10-CM | POA: Diagnosis not present

## 2015-03-29 DIAGNOSIS — M0589 Other rheumatoid arthritis with rheumatoid factor of multiple sites: Secondary | ICD-10-CM | POA: Diagnosis not present

## 2015-03-29 DIAGNOSIS — M150 Primary generalized (osteo)arthritis: Secondary | ICD-10-CM | POA: Diagnosis not present

## 2015-03-30 ENCOUNTER — Encounter: Payer: Self-pay | Admitting: Family Medicine

## 2015-03-30 ENCOUNTER — Ambulatory Visit (INDEPENDENT_AMBULATORY_CARE_PROVIDER_SITE_OTHER): Payer: Medicare Other | Admitting: Family Medicine

## 2015-03-30 ENCOUNTER — Ambulatory Visit
Admission: RE | Admit: 2015-03-30 | Discharge: 2015-03-30 | Disposition: A | Discharge status: Home / Self Care | Payer: Medicare Other | Source: Ambulatory Visit | Attending: Family Medicine | Admitting: Family Medicine

## 2015-03-30 VITALS — BP 126/88 | HR 74 | Temp 97.3°F | Wt 308.5 lb

## 2015-03-30 DIAGNOSIS — M1288 Other specific arthropathies, not elsewhere classified, other specified site: Secondary | ICD-10-CM | POA: Insufficient documentation

## 2015-03-30 DIAGNOSIS — Z9049 Acquired absence of other specified parts of digestive tract: Secondary | ICD-10-CM | POA: Insufficient documentation

## 2015-03-30 DIAGNOSIS — M5137 Other intervertebral disc degeneration, lumbosacral region: Secondary | ICD-10-CM | POA: Insufficient documentation

## 2015-03-30 DIAGNOSIS — G894 Chronic pain syndrome: Secondary | ICD-10-CM | POA: Diagnosis not present

## 2015-03-30 DIAGNOSIS — M51379 Other intervertebral disc degeneration, lumbosacral region without mention of lumbar back pain or lower extremity pain: Secondary | ICD-10-CM | POA: Insufficient documentation

## 2015-03-30 DIAGNOSIS — R05 Cough: Secondary | ICD-10-CM

## 2015-03-30 DIAGNOSIS — M47817 Spondylosis without myelopathy or radiculopathy, lumbosacral region: Secondary | ICD-10-CM | POA: Diagnosis not present

## 2015-03-30 DIAGNOSIS — M47814 Spondylosis without myelopathy or radiculopathy, thoracic region: Secondary | ICD-10-CM | POA: Diagnosis not present

## 2015-03-30 DIAGNOSIS — R058 Other specified cough: Secondary | ICD-10-CM

## 2015-03-30 MED ORDER — OXYCODONE HCL 5 MG PO TABS: ORAL_TABLET | ORAL | Status: DC

## 2015-03-30 NOTE — Assessment & Plan Note (Signed)
Improved.  Followed by pulmonary now.

## 2015-03-30 NOTE — Assessment & Plan Note (Signed)
Oxycodone rx printed and given to pt today.

## 2015-03-30 NOTE — Assessment & Plan Note (Signed)
Unfortunately contributing to her cough and chronic pain.

## 2015-03-30 NOTE — Progress Notes (Signed)
Subjective:   Patient ID: Tina Pacheco, female    DOB: 01-05-1951, 65 y.o.   MRN: [id number]  Tina Pacheco is a pleasant 65 y.o. year old female who presents to clinic today with Follow-up and Back Pain  on 03/30/2015  HPI:  Chronic cough- has seen Dr. Melvyn Novas twice for this- notes reviewed from 02/26/15 and 03/22/15. Cough had improved with prednisone and dulera until past few days.  Now having more drainage and cough again but not as severe.  Chronic pain- known DDD of spine, OA of both knees and RA.  Followed by rheum and ortho. Got out of bed last week and left lower back hurt and has not been the same since.  Painful to walk and sit.  Does not radiate down her leg.  No LE weakness.  Prednisone that she is taking for her cough has not helped for this.  Has been taking more oxycodone.  Current Outpatient Prescriptions on File Prior to Visit  Medication Sig Dispense Refill  . albuterol (PROVENTIL HFA;VENTOLIN HFA) 108 (90 BASE) MCG/ACT inhaler Inhale 2 puffs into the lungs every 6 (six) hours as needed. For shortness of breath 3 Inhaler 1  . amLODipine (NORVASC) 5 MG tablet Take 1 tablet (5 mg total) by mouth daily. 90 tablet 3  . atorvastatin (LIPITOR) 20 MG tablet Take 1 tablet (20 mg total) by mouth daily. 90 tablet 3  . cyclobenzaprine (FLEXERIL) 5 MG tablet Take 1 tablet (5 mg total) by mouth 3 (three) times daily as needed for muscle spasms. 30 tablet 1  . dabigatran (PRADAXA) 150 MG CAPS capsule Take 1 capsule (150 mg total) by mouth 2 (two) times daily. 180 capsule 3  . famotidine (PEPCID) 20 MG tablet One at bedtime 30 tablet 11  . Febuxostat (ULORIC PO) 40 mg daily. Take one by mouth daily, pt unsure of strength    . fluticasone (FLONASE) 50 MCG/ACT nasal spray Place 2 sprays into the nose daily as needed. For allergies     . gabapentin (NEURONTIN) 300 MG capsule Take 1 capsule (300 mg total) by mouth 4 (four) times daily. 120 capsule 3  . hydroxychloroquine (PLAQUENIL) 200 MG  tablet Take by mouth 2 (two) times daily.    Marland Kitchen KLOR-CON M20 20 MEQ tablet TAKE 1/2 TABLET (=10MEQ)   TWICE A DAY; MAY TAKE AN   EXTRA TABLET WHEN TAKING   TORSEMIDE 180 tablet 3  . leflunomide (ARAVA) 20 MG tablet Take 20 mg by mouth daily.    . metoprolol succinate (TOPROL-XL) 50 MG 24 hr tablet take 1 tablet by mouth once daily 30 tablet 3  . mometasone-formoterol (DULERA) 100-5 MCG/ACT AERO Take 2 puffs first thing in am and then another 2 puffs about 12 hours later. 1 Inhaler 11  . montelukast (SINGULAIR) 10 MG tablet take 1 tablet by mouth at bedtime 30 tablet 3  . Multiple Vitamin (MULTIVITAMIN) tablet Take 1 tablet by mouth daily.      Marland Kitchen nystatin (MYCOSTATIN/NYSTOP) 100000 UNIT/GM POWD Apply to area three times daily as needed for rash 60 g 0  . pantoprazole (PROTONIX) 40 MG tablet Take 40 mg by mouth daily.    Marland Kitchen terazosin (HYTRIN) 10 MG capsule take 1 capsule by mouth at bedtime 90 capsule 2  . tolterodine (DETROL) 1 MG tablet Take 1 tablet (1 mg total) by mouth 2 (two) times daily. 60 tablet 3  . torsemide (DEMADEX) 20 MG tablet Take 1 tablet (20 mg total) by mouth  2 (two) times daily. Edema 60 tablet 3  . traMADol (ULTRAM) 50 MG tablet 1-2 every 4 hours as needed for cough or pain 40 tablet 0   No current facility-administered medications on file prior to visit.    No Known Allergies  Past Medical History  Diagnosis Date  . Hypertension   . Dyslipidemia   . PAF (paroxysmal atrial fibrillation) (HCC)     a. on Pradaxa; b. CHADSVASc at least 2 (HTN & female)  . Diastolic dysfunction   . Morbid obesity ([place])   . Hypokalemia   . History of cardiac cath     a. 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 Asthma   . Rheumatoid arthritis(714.0)   . Basal cell carcinoma   . Hyperplastic colonic polyp 2003  . History of gout   . COPD (chronic obstructive pulmonary disease) ([place])   . Diastolic dysfunction     a. echo 07/2014: EF 55-60%, no RWMA, GR2DD, mild MR, LA moderately  dilated, PASP 38 mm Hg  . Sleep apnea     a. not compliant with CPAP    Past Surgical History  Procedure Laterality Date  . Cardiac catheterization  05/24/2010    nonobstructive CAD  . Knee arthroscopy      bilateral  . Vaginal hysterectomy    . Cholecystectomy    . Cesarean section    . Colonoscopy  06/12/2011    Procedure: COLONOSCOPY;  Surgeon: Juanita Craver, MD;  Location: [facility];  Service: Endoscopy;  Laterality: N/A;  . Colonoscopy N/A 03/17/2013    Procedure: COLONOSCOPY;  Surgeon: Juanita Craver, MD;  Location: [facility];  Service: Endoscopy;  Laterality: N/A;    Family History  Problem Relation Age of Onset  . Tuberculosis Mother   . Parkinsonism Mother   . Diabetes type II Sister   . Breast cancer Sister   . Breast cancer Maternal Aunt   . Emphysema Father     smoked  . Tuberculosis Sister     Social History   Social History  . Marital Status: Married    Spouse Name: N/A  . Number of Children: N/A  . Years of Education: N/A   Occupational History  . Retired    Social History Main Topics  . Smoking status: Never Smoker   . Smokeless tobacco: Never Used  . Alcohol Use: No  . Drug Use: No  . Sexual Activity: Not on file   Other Topics Concern  . Not on file   Social History Narrative   The PMH, PSH, Social History, Family History, Medications, and allergies have been reviewed in [facility], and have been updated if relevant.     Review of Systems  Constitutional: Negative.   HENT: Positive for congestion. Negative for rhinorrhea, sinus pressure, sneezing, sore throat, trouble swallowing and voice change.   Respiratory: Positive for cough. Negative for shortness of breath and stridor.   Cardiovascular: Negative.   Gastrointestinal: Negative.   Endocrine: Negative.   Genitourinary: Negative.   Musculoskeletal: Positive for back pain. Negative for myalgias, joint swelling, gait problem, neck pain and neck stiffness.  Skin: Negative.     Allergic/Immunologic: Negative.   Neurological: Negative.   Hematological: Negative.   Psychiatric/Behavioral: Negative.   All other systems reviewed and are negative.      Objective:    BP 126/88 mmHg  Pulse 74  Temp(Src) 97.3 F (36.3 C) (Oral)  Wt 308 lb 8 oz (139.935 kg)  SpO2 93%   Physical Exam  Constitutional: She is oriented to person, place, and time. She appears well-developed and well-nourished. No distress.  Morbidly obese  HENT:  Head: Normocephalic.  Eyes: Conjunctivae are normal.  Cardiovascular: Normal rate.   Pulmonary/Chest: Effort normal.  Musculoskeletal:  Ataxic gait, mildly ttp over left lumbar parapsinous muscles, exam somewhat limited by body habitus  Neurological: She is alert and oriented to person, place, and time. No cranial nerve deficit.  Skin: Skin is warm and dry.  Psychiatric: She has a normal mood and affect. Her behavior is normal. Judgment normal.  Nursing note and vitals reviewed.         Assessment & Plan:   DDD (degenerative disc disease), lumbosacral - Plan: DG Lumbar Spine Complete  Morbid obesity, unspecified obesity type (HCC)  Chronic pain syndrome  Upper airway cough syndrome vs cough variant asthma No Follow-up on file.

## 2015-03-30 NOTE — Assessment & Plan Note (Signed)
Deteriorated. Lumbar xray today. Will likely need to be referred back to ortho.

## 2015-03-30 NOTE — Progress Notes (Signed)
Pre visit review using our clinic review tool, if applicable. No additional management support is needed unless otherwise documented below in the visit note. 

## 2015-03-31 DIAGNOSIS — H04123 Dry eye syndrome of bilateral lacrimal glands: Secondary | ICD-10-CM | POA: Diagnosis not present

## 2015-04-09 ENCOUNTER — Encounter: Payer: Self-pay | Admitting: Adult Health

## 2015-04-09 ENCOUNTER — Ambulatory Visit (INDEPENDENT_AMBULATORY_CARE_PROVIDER_SITE_OTHER): Payer: Medicare Other | Admitting: Adult Health

## 2015-04-09 VITALS — BP 118/66 | HR 69 | Temp 97.9°F | Ht 64.0 in | Wt 318.0 lb

## 2015-04-09 DIAGNOSIS — J4540 Moderate persistent asthma, uncomplicated: Secondary | ICD-10-CM | POA: Diagnosis not present

## 2015-04-09 DIAGNOSIS — R05 Cough: Secondary | ICD-10-CM

## 2015-04-09 DIAGNOSIS — R053 Chronic cough: Secondary | ICD-10-CM

## 2015-04-09 NOTE — Progress Notes (Signed)
Subjective:    Patient ID: Tina Pacheco, female    DOB: 12/09/1950,     MRN: [id number]    Brief patient profile:  9 yowf never smoker with tendency to recurrent sinus infections> going into chest x 1990 with recurrence in Sept 2016 and added   symbicort and singulair and still needed tussionex until first week in November 2016 better so continued singulair daily but changed  symbicort to prn but then bad chest  tightness starting mid December assoc with severe dry cough>  no better with symbicort 160 maint (though hfa poor) so referred 02/26/2015 to pulmonary clinic by Dr Marjory Lies.    History of Present Illness  02/26/2015 1st [facility] office visit/ [facility]   Chief Complaint  Patient presents with  . Pulmonary Consult    Referred by Dr. Marjory Lies. Pt c/o difficulty breathing "for years"- worse x 2 wks. She c/o chest pressure, non prod cough and DOE with very minimal exertion x 2 wks. She also c/o swelling in her legs.   cough is dry with sensation of something stuck in throat worse with activity / no change with perfume exp or eating/ assoc with sense of nasal congestion, sore throat and dysphagia worse day than noct  rec Pantoprazole 40 mg  Take  30-60 min before first meal of the day and Pepcid (famotidine)  20 mg one @  bedtime until return to office   GERD  Diet  Prednisone 10 mg take  4 each am x 2 days,   2 each am x 2 days,  1 each am x 2 days and stop   Start on Jan 2>> dulera 100 Take 2 puffs first thing in am and then another 2 puffs about 12 hours later.  Work on inhaler technique:    Take delsym two tsp every 12 hours and supplement if needed with  tramadol 50 mg up to 2 every 4 hours   Please schedule a follow up office visit in 3 weeks, sooner if needed > bring all medications with you     03/22/2015  f/u ov/[facility] re: recurrent cough since 10/2014  did not bring all meds / did not stay on dulera  Chief Complaint  Patient presents with  . Follow-up    Cough had  completely resovled 1 wk after last visit, until a few days ago started sneezing and coughing again. Her breathing has improved. She has not needed albuterol since the last visit.   not clear she really improved s need for tramadol before recurred, confused with maint vs prns >>pred taper , cont on Dulera   04/09/2015 Follow up : Chronic cough , never smoker  Pt returns for 2 week follow up .  Seen in Dec for pulmonary consult for chronic cough  that started in Sept that would not go away despite multiple treatments. Has had a cough for years . Got worse in 10/2014.  She was seen by Dr. Melvyn Novas  And started on prednisone taper, cough suppressants, GERD and AR treament  CXR showed significant increased interstitial markings.   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 No unusual travel or hobbies. No etoh/drug  Hx .  Has cat/dog in house. From this area.  No known hx of amiodarone or macrobid.   Has RA on ARAVA /Plaquenil . Has been on for >3 yrs.  Has been on Methotrexate in past , was on it for >20 yrs , stopped 3 yrs ago.   Continues to have dry  cough chest congestion/tightness, sinus drainage, and SOB with activity. Denies any sinus pressure, wheezing, fever, nausea or vomiting or discolored mucus.   We reviewed all her meds and organized them into med calendar w/ pt education  Appears to be taking correctly.   Current Medications, Allergies, Complete Past Medical History, Past Surgical History, Family History, and Social History were reviewed in [employer] record.  ROS  The following are not active complaints unless bolded sore throat, dysphagia, dental problems, itching, sneezing,  nasal congestion or excess/ purulent secretions, ear ache,   fever, chills, sweats, unintended wt loss, classically pleuritic or exertional cp,  orthopnea pnd or leg swelling, presyncope, palpitations, abdominal pain, anorexia, nausea, vomiting, diarrhea  or change in bowel or bladder habits, change in  stools or urine, dysuria,hematuria,  rash, arthralgias, visual complaints, headache, numbness, weakness or ataxia or problems with walking or coordination,  change in mood/affect or memory.            Objective:   Physical Exam Filed Vitals:   04/09/15 1123  BP: 118/66  Pulse: 69  Temp: 97.9 F (36.6 C)  TempSrc: Oral  Height: 5\' 4"  (1.626 m)  Weight: 318 lb (144.244 kg)  SpO2: 98%    Massively obese amb wf    Vital signs reviewed  HEENT: nl dentition, turbinates, and oropharynx. Nl external ear canals without cough reflex   NECK :  without JVD/Nodes/TM/ nl carotid upstrokes bilaterally   LUNGS: no acc muscle use,  Nl contour chest which is clear to A and P bilaterally without cough on insp or exp maneuvers -    CV:  RRR  no s3 or murmur or increase in P2,  Trace bilateral pitting lower ext  edema   ABD:  Massive but soft and nontender   No bruits or organomegaly, bowel sounds nl  MS:  Nl gait/ ext warm without deformities, calf tenderness, cyanosis or clubbing No obvious joint restrictions   SKIN: warm and dry without lesions    NEURO:  alert, approp, nl sensorium with  no motor deficits    CXR PA and Lateral:   02/26/2015 :     Progressive pulmonary interstitial thickening. Although this could be related to the history of asthma/ COPD, mild pulmonary venous congestion is suspected.   per Dr. Melvyn Novas   Left lower lobe aeration is improved. Suspect mild residual pleural parenchymal scarring. No well-defined residual consolidation.      Assessment & Plan:

## 2015-04-09 NOTE — Progress Notes (Signed)
Chart and office note reviewed in detail  > agree with a/p as outlined    

## 2015-04-09 NOTE — Assessment & Plan Note (Signed)
Cont on current regimen  Will need PFT or spirometry in future.

## 2015-04-09 NOTE — Assessment & Plan Note (Signed)
Patient's medications were reviewed today and patient education was given. Computerized medication calendar was adjusted/completed  Suspect cough is multifactoral with chronic rhinitis , possible GERD .  She has underlying RA and was on Methotrexate in past .  CXR does show increased interstitial markings  Will need HRCT chest to evaluate for possible ILD changes.   Plan  We are setting you up for a HRCT Chest .  Follow med calendar closely and bring to each visit.  Continue on current regimen .  Follow up with Dr. Melvyn Novas  In 4 weeks and As needed

## 2015-04-09 NOTE — Patient Instructions (Addendum)
We are setting you up for a HRCT Chest .  Follow med calendar closely and bring to each visit.  Continue on current regimen .  Follow up with Dr. Melvyn Novas  In 4 weeks and As needed

## 2015-04-14 ENCOUNTER — Ambulatory Visit: Payer: Medicare Other | Attending: Adult Health

## 2015-04-14 NOTE — Addendum Note (Signed)
Addended by: Osa Craver on: 04/14/2015 10:29 AM   Modules accepted: Orders, Medications

## 2015-05-04 ENCOUNTER — Ambulatory Visit (INDEPENDENT_AMBULATORY_CARE_PROVIDER_SITE_OTHER): Payer: Medicare Other | Admitting: Family Medicine

## 2015-05-04 ENCOUNTER — Encounter: Payer: Self-pay | Admitting: Family Medicine

## 2015-05-04 VITALS — BP 167/81 | HR 81 | Temp 98.1°F | Wt 313.5 lb

## 2015-05-04 DIAGNOSIS — I10 Essential (primary) hypertension: Secondary | ICD-10-CM

## 2015-05-04 DIAGNOSIS — R05 Cough: Secondary | ICD-10-CM

## 2015-05-04 DIAGNOSIS — R053 Chronic cough: Secondary | ICD-10-CM

## 2015-05-04 MED ORDER — HYDROCOD POLST-CPM POLST ER 10-8 MG/5ML PO SUER: 5.0000 mL | Freq: Two times a day (BID) | ORAL | Status: DC | PRN

## 2015-05-04 MED ORDER — AZITHROMYCIN 250 MG PO TABS: ORAL_TABLET | ORAL | Status: DC

## 2015-05-04 NOTE — Progress Notes (Signed)
Pre visit review using our clinic review tool, if applicable. No additional management support is needed unless otherwise documented below in the visit note. 

## 2015-05-04 NOTE — Progress Notes (Signed)
Subjective:   Patient ID: Tina Pacheco, female    DOB: 1950-08-31, 65 y.o.   MRN: [id number]  Tina Pacheco is a pleasant 65 y.o. year old female who presents to clinic today with Cough  on 05/04/2015  HPI:  Chronic cough- has seen Dr. Melvyn Novas twice for this- notes reviewed from 02/26/15 and 03/22/15 and Tammy Parrett on 04/09/15. Cough had improved with prednisone and dulera until past few days.  Now having more drainage and severe cough.  Delsym not helping.  Having chills, no fever. Has follow up with pulmonary in 3 days.   BP elevated today, normally well controlled.  Has not taken any of her medications yet today. Current Outpatient Prescriptions on File Prior to Visit  Medication Sig Dispense Refill  . amLODipine (NORVASC) 5 MG tablet Take 1 tablet (5 mg total) by mouth daily. (Patient taking differently: Take 5 mg by mouth every morning. ) 90 tablet 3  . atorvastatin (LIPITOR) 20 MG tablet Take 1 tablet (20 mg total) by mouth daily. (Patient taking differently: Take 20 mg by mouth at bedtime. ) 90 tablet 3  . chlorpheniramine (CHLOR-TRIMETON) 4 MG tablet Take 4 mg by mouth every 4 (four) hours as needed (for drippy nose, drainage, and throat clearing).    . dabigatran (PRADAXA) 150 MG CAPS capsule Take 1 capsule (150 mg total) by mouth 2 (two) times daily. 180 capsule 3  . dextromethorphan (DELSYM) 30 MG/5ML liquid Take 2 tsp twice daily as needed for cough    . famotidine (PEPCID) 20 MG tablet One at bedtime (Patient taking differently: Take 20 mg by mouth at bedtime. ) 30 tablet 11  . Febuxostat (ULORIC PO) Take 40 mg by mouth every morning.     . hydroxychloroquine (PLAQUENIL) 200 MG tablet Take by mouth 2 (two) times daily.    Marland Kitchen KLOR-CON M20 20 MEQ tablet TAKE 1/2 TABLET (=10MEQ)   TWICE A DAY; MAY TAKE AN   EXTRA TABLET WHEN TAKING   TORSEMIDE (Patient taking differently: TAKE 1/2 TABLET (=10MEQ)   TWICE A DAY) 180 tablet 3  . leflunomide (ARAVA) 20 MG tablet Take 20 mg by mouth  every morning.     . metoprolol succinate (TOPROL-XL) 50 MG 24 hr tablet take 1 tablet by mouth once daily (Patient taking differently: TAKE 1 TABLET BY MOUTH EVERY MORNING) 30 tablet 3  . mometasone-formoterol (DULERA) 100-5 MCG/ACT AERO Take 2 puffs first thing in am and then another 2 puffs about 12 hours later. 1 Inhaler 11  . montelukast (SINGULAIR) 10 MG tablet take 1 tablet by mouth at bedtime 30 tablet 3  . Multiple Vitamin (MULTIVITAMIN) tablet Take 1 tablet by mouth every morning.     Marland Kitchen oxyCODONE (ROXICODONE) 5 MG immediate release tablet 2 tablets every 12 hours (Patient taking differently: Take 2 tablets by mouth every 12 hours) 270 tablet 0  . pantoprazole (PROTONIX) 40 MG tablet Take 40 mg by mouth daily before breakfast.     . pregabalin (LYRICA) 150 MG capsule Take 150 mg by mouth 3 (three) times daily.    Marland Kitchen terazosin (HYTRIN) 10 MG capsule take 1 capsule by mouth at bedtime 90 capsule 2  . torsemide (DEMADEX) 20 MG tablet Take 1 tablet (20 mg total) by mouth 2 (two) times daily. Edema (Patient taking differently: Take 20 mg by mouth 2 (two) times daily as needed. ) 60 tablet 3  . traMADol (ULTRAM) 50 MG tablet 1-2 every 4 hours as needed for cough  or pain (Patient taking differently: Take 1-2 every 4 hours as needed for cough) 40 tablet 0   No current facility-administered medications on file prior to visit.    No Known Allergies  Past Medical History  Diagnosis Date  . Hypertension   . Dyslipidemia   . PAF (paroxysmal atrial fibrillation) (HCC)     a. on Pradaxa; b. CHADSVASc at least 2 (HTN & female)  . Diastolic dysfunction   . Morbid obesity ([place])   . Hypokalemia   . History of cardiac cath     a. 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 Asthma   . Rheumatoid arthritis(714.0)   . Basal cell carcinoma   . Hyperplastic colonic polyp 2003  . History of gout   . COPD (chronic obstructive pulmonary disease) ([place])   . Diastolic dysfunction     a. echo 07/2014:  EF 55-60%, no RWMA, GR2DD, mild MR, LA moderately dilated, PASP 38 mm Hg  . Sleep apnea     a. not compliant with CPAP    Past Surgical History  Procedure Laterality Date  . Cardiac catheterization  05/24/2010    nonobstructive CAD  . Knee arthroscopy      bilateral  . Vaginal hysterectomy    . Cholecystectomy    . Cesarean section    . Colonoscopy  06/12/2011    Procedure: COLONOSCOPY;  Surgeon: Juanita Craver, MD;  Location: [facility];  Service: Endoscopy;  Laterality: N/A;  . Colonoscopy N/A 03/17/2013    Procedure: COLONOSCOPY;  Surgeon: Juanita Craver, MD;  Location: [facility];  Service: Endoscopy;  Laterality: N/A;    Family History  Problem Relation Age of Onset  . Tuberculosis Mother   . Parkinsonism Mother   . Diabetes type II Sister   . Breast cancer Sister   . Breast cancer Maternal Aunt   . Emphysema Father     smoked  . Tuberculosis Sister     Social History   Social History  . Marital Status: Married    Spouse Name: N/A  . Number of Children: N/A  . Years of Education: N/A   Occupational History  . Retired    Social History Main Topics  . Smoking status: Never Smoker   . Smokeless tobacco: Never Used  . Alcohol Use: No  . Drug Use: No  . Sexual Activity: Not on file   Other Topics Concern  . Not on file   Social History Narrative   The PMH, PSH, Social History, Family History, Medications, and allergies have been reviewed in [facility], and have been updated if relevant.     Review of Systems  Constitutional: Positive for chills. Negative for fever.  HENT: Positive for congestion, postnasal drip, rhinorrhea, sinus pressure, sneezing and sore throat. Negative for trouble swallowing and voice change.   Respiratory: Positive for cough. Negative for shortness of breath and stridor.   Cardiovascular: Negative.   Gastrointestinal: Negative.   Endocrine: Negative.   Genitourinary: Negative.   Musculoskeletal: Positive for back pain. Negative for  myalgias, joint swelling, gait problem, neck pain and neck stiffness.  Skin: Negative.   Allergic/Immunologic: Negative.   Neurological: Negative.   Hematological: Negative.   Psychiatric/Behavioral: Negative.   All other systems reviewed and are negative.      Objective:    BP 167/81 mmHg  Pulse 81  Temp(Src) 98.1 F (36.7 C) (Oral)  Wt 313 lb 8 oz (142.203 kg)  SpO2 94%  BP Readings from Last 3 Encounters:  05/04/15  167/81  04/09/15 118/66  03/30/15 126/88    Physical Exam  Constitutional: She is oriented to person, place, and time. She appears well-developed and well-nourished. No distress.  Morbidly obese  HENT:  Head: Normocephalic.  Right Ear: Hearing and tympanic membrane normal.  Left Ear: Hearing and tympanic membrane normal.  Nose: Mucosal edema and rhinorrhea present. Right sinus exhibits no maxillary sinus tenderness and no frontal sinus tenderness. Left sinus exhibits no maxillary sinus tenderness and no frontal sinus tenderness.  Eyes: Conjunctivae are normal.  Cardiovascular: Normal rate.   Pulmonary/Chest: Effort normal.  Neurological: She is alert and oriented to person, place, and time. No cranial nerve deficit.  Skin: Skin is warm and dry.  Psychiatric: She has a normal mood and affect. Her behavior is normal. Judgment normal.  Nursing note and vitals reviewed.         Assessment & Plan:   Chronic cough  Essential hypertension No Follow-up on file.

## 2015-05-04 NOTE — Assessment & Plan Note (Signed)
Deteriorated secondary to likely viral illness. Advised likely virus and abx not indicated at this time.  Given rx for zpack to fill only if symptoms progress but I did advise her to keep appt with pulmonary as well. Tussionex as needed only for SEVERE/post tussive emesis inducing cough. Call or return to clinic prn if these symptoms worsen or fail to improve as anticipated. The patient indicates understanding of these issues and agrees with the plan.

## 2015-05-04 NOTE — Assessment & Plan Note (Signed)
Deteriorated likely because she has not had her medications today. Asymptomatic.  Usually well controlled.  Advised to take rxs as soon as she gets home. The patient indicates understanding of these issues and agrees with the plan.

## 2015-05-07 ENCOUNTER — Encounter: Payer: Self-pay | Admitting: Internal Medicine

## 2015-05-07 ENCOUNTER — Ambulatory Visit (INDEPENDENT_AMBULATORY_CARE_PROVIDER_SITE_OTHER): Payer: Medicare Other | Admitting: Internal Medicine

## 2015-05-07 VITALS — BP 148/82 | HR 83 | Ht 64.0 in | Wt 320.6 lb

## 2015-05-07 DIAGNOSIS — R05 Cough: Secondary | ICD-10-CM

## 2015-05-07 DIAGNOSIS — R058 Other specified cough: Secondary | ICD-10-CM

## 2015-05-07 DIAGNOSIS — J45991 Cough variant asthma: Secondary | ICD-10-CM | POA: Diagnosis not present

## 2015-05-07 MED ORDER — TRAMADOL HCL 50 MG PO TABS: ORAL_TABLET | ORAL | Status: DC

## 2015-05-07 MED ORDER — PREDNISONE 10 MG PO TABS: ORAL_TABLET | ORAL | Status: DC

## 2015-05-07 MED ORDER — FLUTTER DEVI: Status: DC

## 2015-05-07 NOTE — Progress Notes (Signed)
Subjective:    Patient ID: Tina Pacheco, female    DOB: 10-02-1950,     MRN: [id number]    Brief patient profile:  78 yowf never smoker with tendency to recurrent sinus infections> going into chest x 1990 with recurrence in Sept 2016 and added   symbicort and singulair and still needed tussionex until first week in November 2016 better so continued singulair daily but changed  symbicort to prn but then bad chest  tightness starting mid December assoc with severe dry cough>  no better with symbicort 160 maint (though hfa poor) so referred 02/26/2015 to pulmonary clinic by Dr Marjory Lies.    History of Present Illness  02/26/2015 1st [name] office visit/ Wert   Chief Complaint  Patient presents with  . Pulmonary Consult    Referred by Dr. Marjory Lies. Pt c/o difficulty breathing "for years"- worse x 2 wks. She c/o chest pressure, non prod cough and DOE with very minimal exertion x 2 wks. She also c/o swelling in her legs.   cough is dry with sensation of something stuck in throat worse with activity / no change with perfume exp or eating/ assoc with sense of nasal congestion, sore throat and dysphagia worse day than noct  rec Pantoprazole 40 mg  Take  30-60 min before first meal of the day and Pepcid (famotidine)  20 mg one @  bedtime until return to office   GERD  Diet  Prednisone 10 mg take  4 each am x 2 days,   2 each am x 2 days,  1 each am x 2 days and stop   Start on Jan 2>> dulera 100 Take 2 puffs first thing in am and then another 2 puffs about 12 hours later.  Work on inhaler technique:    Take delsym two tsp every 12 hours and supplement if needed with  tramadol 50 mg up to 2 every 4 hours   Please schedule a follow up office visit in 3 weeks, sooner if needed > bring all medications with you     03/22/2015  f/u ov/Wert re: recurrent cough since 10/2014  did not bring all meds / did not stay on dulera  Chief Complaint  Patient presents with  . Follow-up    Cough had  completely resovled 1 wk after last visit, until a few days ago started sneezing and coughing again. Her breathing has improved. She has not needed albuterol since the last visit.   not clear she really improved s need for tramadol before recurred, confused with maint vs prns rec GERD  Diet  Prednisone 10 mg take  4 each am x 2 days,   2 each am x 2 days,  1 each am x 2 days and stop   Start on Jan 27 >> dulera 100 Take 2 puffs first thing in am and then another 2 puffs about 12 hours later.  For drainage / throat tickle try take CHLORPHENIRAMINE  4 mg - take one every 4 hours as needed -   Work on inhaler technique:  relax and gently blow all the way out then take a nice smooth deep breath back in, triggering the inhaler at same time you start breathing in.  Hold for up to 5 seconds if you can. Blow out thru nose. Rinse and gargle with water when done Take delsym two tsp every 12 hours and supplement if needed with  tramadol 50 mg up to 2 every 4 hours  Once  you have eliminated the cough for 3 straight days try reducing the tramadol first,  then the delsym as tolerated  04/19/15 NP ov Still some mild dry cough but much better  We are setting you up for a HRCT Chest .  Follow med calendar closely and bring to each visit.      05/07/2015  f/u ov/Wert re:  Chronic cough x 1990's  Worse since sept 2016 / no med calendar(doesn't recognize it)  Chief Complaint  Patient presents with  . Follow-up    Pt c/o cough with yellow mucus, wheezing, SOB, sinus pain/pressure, evening chill/sweats and some nausea since this past weekend. Pt was seen by PCP Tuesday and was given Hycodan and a Zpak. Pt denies CP/tightness. Pt reports that she has not been eating and drinking well.   continued delsym despite "all better"  but no need for tramadol then  acutely ill 04/03/15 then saw [facility] 2/7 zpak / hydrocodone > improved but not back to baseline Stopped roxicodone at onset of cough because took to bed and back pain  not a concern when not up but never took the tramdol that was rec prn for cough      No obvious day to day or daytime variability or assoc excess/ purulent sputum or mucus plugs or hemoptysis or cp or chest tightness, subjective wheeze or overt  hb symptoms. No unusual exp hx or h/o childhood pna/ asthma or knowledge of premature birth.  Sleeping ok without nocturnal  or early am exacerbation  of respiratory  c/o's or need for noct saba. Also denies any obvious fluctuation of symptoms with weather or environmental changes or other aggravating or alleviating factors except as outlined above   Current Medications, Allergies, Complete Past Medical History, Past Surgical History, Family History, and Social History were reviewed in [employer] record.  ROS  The following are not active complaints unless bolded sore throat, dysphagia, dental problems, itching, sneezing,  nasal congestion or excess/ purulent secretions, ear ache,   fever, chills, sweats, unintended wt loss, classically pleuritic or exertional cp,  orthopnea pnd or leg swelling, presyncope, palpitations, abdominal pain, anorexia, nausea, vomiting, diarrhea  or change in bowel or bladder habits, change in stools or urine, dysuria,hematuria,  rash, arthralgias, visual complaints, headache, numbness, weakness or ataxia or problems with walking or coordination,  change in mood/affect or memory.                     .         Objective:   Physical Exam    Massively obese amb wf could not climb on exam table with very harsh upper airway pattern dry coughing fits  .05/07/2015       321 03/22/2015        313   02/26/15 333 lb 9.6 oz (151.32 kg)  02/23/15 334 lb 8 oz (151.728 kg)  12/22/14 328 lb 12.8 oz (149.143 kg)    Vital signs reviewed  HEENT: nl dentition, turbinates, and oropharynx. Nl external ear canals without cough reflex   NECK :  without JVD/Nodes/TM/ nl carotid upstrokes  bilaterally   LUNGS: no acc muscle use,  Nl contour chest which is clear to A and P bilaterally without cough on insp or exp maneuvers - distant bs bilateraly  pseudodwheeze mostly but difficult to sort out due to coughing fits/ may have insp and exp rhonchi as well    CV:  RRR  no s3 or murmur or  increase in P2,  Trace bilateral pitting lower ext  edema   ABD:  Massive but soft and nontender   No bruits or organomegaly, bowel sounds nl  MS:  Nl gait/ ext warm without deformities, calf tenderness, cyanosis or clubbing No obvious joint restrictions   SKIN: warm and dry without lesions    NEURO:  alert, approp, nl sensorium with  no motor deficits          Assessment & Plan:

## 2015-05-07 NOTE — Patient Instructions (Addendum)
GERD (REFLUX)  is an extremely common cause of respiratory symptoms just like yours , many times with no obvious heartburn at all.    It can be treated with medication, but also with lifestyle changes including elevation of the head of your bed (ideally with 6 inch  bed blocks),  Smoking cessation, avoidance of late meals, excessive alcohol, and avoid fatty foods, chocolate, peppermint, colas, red wine, and acidic juices such as orange juice.  NO MINT OR MENTHOL PRODUCTS SO NO COUGH DROPS  USE SUGARLESS CANDY INSTEAD (Jolley ranchers or Stover's or Life Savers) or even ice chips will also do - the key is to swallow to prevent all throat clearing. NO OIL BASED VITAMINS - use powdered substitutes.  Prednisone 10 mg take  4 each am x 2 days,   2 each am x 2 days,  1 each am x 2 days and stop    Continue  dulera 100 Take 2 puffs first thing in am and then another 2 puffs about 12 hours later.   - Work on inhaler technique:  relax and gently blow all the way out then take a nice smooth deep breath back in, triggering the inhaler at same time you start breathing in.  Hold for up to 5 seconds if you can. Blow out thru nose. Rinse and gargle with water when done   For drainage / throat tickle try take CHLORPHENIRAMINE  4 mg - take one every 4 hours as needed - available over the counter- may cause drowsiness so start with just a bedtime dose or two and see how you tolerate it before trying in daytime       Take delsym two tsp every 12 hours and use the flutter valve  and supplement if needed with  tramadol 50 mg up to 2 every 4 hours to suppress the urge to cough. Swallowing water or using ice chips/non mint and menthol containing candies (such as lifesavers or sugarless jolly ranchers) are also effective.  You should rest your voice and avoid activities that you know make you cough.  Once you have eliminated the cough for 3 straight days try reducing the tramadol first,  then the delsym as  tolerated  Please see patient coordinator before you leave today  to schedule sinus CT and high resolution chest CT next week, no sooner   Please schedule a follow up office visit in 4 weeks, sooner if needed  When return bring your medications in 2 separate bags, the ones you take no matter(automatically)  what vs the as needed (only when you feel you need them)

## 2015-05-09 NOTE — Assessment & Plan Note (Signed)
Body mass index is 55    Lab Results  Component Value Date   TSH 0.65 07/06/2014     Contributing to gerd tendency/ doe/reviewed the need and the process to achieve and maintain neg calorie balance > defer f/u primary care including intermittently monitoring thyroid status

## 2015-05-09 NOTE — Assessment & Plan Note (Addendum)
-   The proper method of use, as well as anticipated side effects, of a metered-dose inhaler are discussed and demonstrated to the patient. Improved effectiveness after extensive coaching during this visit to a level of approximately 75 % from a baseline of 50 %   Really again not clear how much is UACS vs asthma but I favor the former strongly and need to cover the latter until sort all this out and in proceed with further w/u to include sinus ct and hrct looking for ILD or bronchiectasis that might explain refractory symptoms   Concerned not really understanding how to use med calendar and keep maint vs prns (action plans) sorted out   I had an extended discussion with the patient reviewing all relevant studies completed to date and  lasting 15 to 20 minutes of a 25 minute visit    Each maintenance medication was reviewed in detail including most importantly the difference between maintenance and prns and under what circumstances the prns are to be triggered using an action plan format that is not reflected in the computer generated alphabetically organized AVS but trather by a customized med calendar that reflects the AVS meds with confirmed 100% correlation.   Please see instructions for details which were reviewed in writing and the patient given a copy highlighting the part that I personally wrote and discussed at today's ov.

## 2015-05-12 ENCOUNTER — Telehealth: Payer: Self-pay

## 2015-05-12 DIAGNOSIS — R05 Cough: Secondary | ICD-10-CM

## 2015-05-12 DIAGNOSIS — R053 Chronic cough: Secondary | ICD-10-CM

## 2015-05-12 NOTE — Telephone Encounter (Signed)
-----   Message from Tanda Rockers, MD sent at 05/12/2015  1:05 PM EDT ----- Regarding: RE: place corrected order I want a sinus ct in any form they will agree to do it. ----- Message -----    From: Kenyon Ana    Sent: 05/12/2015  11:41 AM      To: April H Pait, Tanda Rockers, MD Subject: place corrected order                          Good morning, Your patient is coming in for a CT maxillofacial tomorrow and per protocol we do not do limited exams for the maxillofacial. Please place corrected order Ct Maxillofacial without contrast [id number]). Can you please clarify if you also want to look at the sinuses. Thank you and have a great day.

## 2015-05-12 NOTE — Telephone Encounter (Signed)
Order placed for ct sinus.  Nothing further needed.

## 2015-05-13 ENCOUNTER — Ambulatory Visit: Payer: Medicare Other

## 2015-05-13 ENCOUNTER — Ambulatory Visit
Admission: RE | Admit: 2015-05-13 | Discharge: 2015-05-13 | Disposition: A | Discharge status: Home / Self Care | Payer: Medicare Other | Source: Ambulatory Visit | Attending: Internal Medicine | Admitting: Internal Medicine

## 2015-05-13 DIAGNOSIS — J322 Chronic ethmoidal sinusitis: Secondary | ICD-10-CM | POA: Insufficient documentation

## 2015-05-13 DIAGNOSIS — J219 Acute bronchiolitis, unspecified: Secondary | ICD-10-CM | POA: Diagnosis not present

## 2015-05-13 DIAGNOSIS — R05 Cough: Secondary | ICD-10-CM | POA: Insufficient documentation

## 2015-05-13 DIAGNOSIS — J329 Chronic sinusitis, unspecified: Secondary | ICD-10-CM | POA: Diagnosis not present

## 2015-05-13 DIAGNOSIS — I2510 Atherosclerotic heart disease of native coronary artery without angina pectoris: Secondary | ICD-10-CM | POA: Diagnosis not present

## 2015-05-13 DIAGNOSIS — R220 Localized swelling, mass and lump, head: Secondary | ICD-10-CM | POA: Diagnosis not present

## 2015-05-13 DIAGNOSIS — K769 Liver disease, unspecified: Secondary | ICD-10-CM | POA: Diagnosis not present

## 2015-05-13 DIAGNOSIS — R053 Chronic cough: Secondary | ICD-10-CM

## 2015-05-13 DIAGNOSIS — R599 Enlarged lymph nodes, unspecified: Secondary | ICD-10-CM | POA: Diagnosis not present

## 2015-05-13 DIAGNOSIS — R058 Other specified cough: Secondary | ICD-10-CM

## 2015-05-13 DIAGNOSIS — R918 Other nonspecific abnormal finding of lung field: Secondary | ICD-10-CM | POA: Insufficient documentation

## 2015-05-13 DIAGNOSIS — J45991 Cough variant asthma: Secondary | ICD-10-CM

## 2015-05-14 NOTE — Progress Notes (Signed)
Quick Note:  Spoke with pt and notified of results per Dr. Wert. Pt verbalized understanding and denied any questions.  ______ 

## 2015-05-16 ENCOUNTER — Other Ambulatory Visit: Payer: Self-pay | Admitting: Family Medicine

## 2015-06-15 ENCOUNTER — Ambulatory Visit (INDEPENDENT_AMBULATORY_CARE_PROVIDER_SITE_OTHER): Payer: Medicare Other | Admitting: Family Medicine

## 2015-06-15 ENCOUNTER — Encounter: Payer: Self-pay | Admitting: Internal Medicine

## 2015-06-15 ENCOUNTER — Telehealth: Payer: Self-pay | Admitting: *Deleted

## 2015-06-15 ENCOUNTER — Encounter: Payer: Self-pay | Admitting: Adult Health

## 2015-06-15 ENCOUNTER — Encounter: Payer: Self-pay | Admitting: Family Medicine

## 2015-06-15 ENCOUNTER — Ambulatory Visit (INDEPENDENT_AMBULATORY_CARE_PROVIDER_SITE_OTHER): Payer: Medicare Other | Admitting: Adult Health

## 2015-06-15 VITALS — BP 140/84 | HR 77 | Temp 97.5°F | Wt 323.0 lb

## 2015-06-15 VITALS — BP 116/84 | HR 71 | Temp 97.4°F | Ht 64.0 in | Wt 324.0 lb

## 2015-06-15 DIAGNOSIS — Z23 Encounter for immunization: Secondary | ICD-10-CM | POA: Diagnosis not present

## 2015-06-15 DIAGNOSIS — R05 Cough: Secondary | ICD-10-CM

## 2015-06-15 DIAGNOSIS — M545 Low back pain: Secondary | ICD-10-CM | POA: Diagnosis not present

## 2015-06-15 DIAGNOSIS — J479 Bronchiectasis, uncomplicated: Secondary | ICD-10-CM | POA: Insufficient documentation

## 2015-06-15 DIAGNOSIS — M1990 Unspecified osteoarthritis, unspecified site: Secondary | ICD-10-CM | POA: Diagnosis not present

## 2015-06-15 DIAGNOSIS — M5137 Other intervertebral disc degeneration, lumbosacral region: Secondary | ICD-10-CM

## 2015-06-15 DIAGNOSIS — R053 Chronic cough: Secondary | ICD-10-CM

## 2015-06-15 MED ORDER — MOMETASONE FURO-FORMOTEROL FUM 100-5 MCG/ACT IN AERO: 2.0000 | INHALATION_SPRAY | Freq: Two times a day (BID) | RESPIRATORY_TRACT | Status: DC

## 2015-06-15 MED ORDER — OXYCODONE HCL 5 MG PO TABS: ORAL_TABLET | ORAL | Status: DC

## 2015-06-15 NOTE — Progress Notes (Signed)
Chart and office note reviewed in detail along with available xrays  > agree with a/p as outlined > has bronchiectasis on HRCT but not change in rx needed

## 2015-06-15 NOTE — Progress Notes (Signed)
Subjective:   Patient ID: Tina Pacheco, female    DOB: October 22, 1950, 65 y.o.   MRN: [id number]  Tina Pacheco is a pleasant 64 y.o. year old female with complicated medical history, well known to me, who presents to clinic today with Follow-up and Back Pain  on 06/15/2015  HPI: Chronic cough- has been followed by pulmonary, Dr. Melvyn Novas.  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 Saw pulmonary this morning. She feels Ruthe Mannan is working well.  Chronic pain- known DDD of spine, OA of both knees and RA. Followed by rheum and ortho. Has been taking more oxycodone. She has refused getting epidural injections so we have been managing this with narcotics, she is aware this is not ideal  Morbid obesity-we have discussed that her weight is a contributing factor in her chronic pain.  She has tried weight watchers in the past.  She also looked into nutri system but it is cost prohibitive.  Current Outpatient Prescriptions on File Prior to Visit  Medication Sig Dispense Refill  . amLODipine (NORVASC) 5 MG tablet Take 1 tablet (5 mg total) by mouth daily. (Patient taking differently: Take 5 mg by mouth every morning. ) 90 tablet 3  . atorvastatin (LIPITOR) 20 MG tablet Take 1 tablet (20 mg total) by mouth daily. (Patient taking differently: Take 20 mg by mouth at bedtime. ) 90 tablet 3  . chlorpheniramine (CHLOR-TRIMETON) 4 MG tablet Take 4 mg by mouth every 4 (four) hours as needed (for drippy nose, drainage, and throat clearing).    . dabigatran (PRADAXA) 150 MG CAPS capsule Take 1 capsule (150 mg total) by mouth 2 (two) times daily. 180 capsule 3  . dextromethorphan (DELSYM) 30 MG/5ML liquid Take 2 tsp twice daily as needed for cough    . famotidine (PEPCID) 20 MG tablet One at bedtime (Patient taking differently: Take 20 mg by mouth at bedtime. ) 30 tablet 11  . Febuxostat (ULORIC PO) Take 40 mg by mouth every morning.     . fluticasone (FLONASE) 50 MCG/ACT nasal spray Place 2 sprays into  both nostrils daily as needed (nasal stuffiness).    . gabapentin (NEURONTIN) 300 MG capsule Take 300 mg by mouth 3 (three) times daily.    . hydroxychloroquine (PLAQUENIL) 200 MG tablet Take by mouth 2 (two) times daily.    Marland Kitchen KLOR-CON M20 20 MEQ tablet TAKE 1/2 TABLET (=10MEQ)   TWICE A DAY; MAY TAKE AN   EXTRA TABLET WHEN TAKING   TORSEMIDE (Patient taking differently: TAKE 1/2 TABLET (=10MEQ)   TWICE A DAY) 180 tablet 3  . leflunomide (ARAVA) 20 MG tablet Take 20 mg by mouth every morning.     . metoprolol succinate (TOPROL-XL) 50 MG 24 hr tablet take 1 tablet by mouth once daily 30 tablet 3  . mometasone-formoterol (DULERA) 100-5 MCG/ACT AERO Inhale 2 puffs into the lungs 2 (two) times daily. 1 Inhaler 0  . montelukast (SINGULAIR) 10 MG tablet take 1 tablet by mouth at bedtime 30 tablet 3  . Multiple Vitamin (MULTIVITAMIN) tablet Take 1 tablet by mouth every morning.     . pantoprazole (PROTONIX) 40 MG tablet Take 40 mg by mouth daily before breakfast.     . pregabalin (LYRICA) 150 MG capsule Take 150 mg by mouth 3 (three) times daily.    Marland Kitchen Respiratory Therapy Supplies (FLUTTER) DEVI Use as directed 1 each 0  . terazosin (HYTRIN) 10 MG capsule take 1 capsule by mouth at bedtime 90 capsule 2  .  torsemide (DEMADEX) 20 MG tablet Take 1 tablet (20 mg total) by mouth 2 (two) times daily. Edema (Patient taking differently: Take 20 mg by mouth 2 (two) times daily as needed. ) 60 tablet 3  . traMADol (ULTRAM) 50 MG tablet 1-2 every 4 hours as needed for cough or pain 40 tablet 0   No current facility-administered medications on file prior to visit.    No Known Allergies  Past Medical History  Diagnosis Date  . Hypertension   . Dyslipidemia   . PAF (paroxysmal atrial fibrillation) (HCC)     a. on Pradaxa; b. CHADSVASc at least 2 (HTN & female)  . Diastolic dysfunction   . Morbid obesity ([place])   . Hypokalemia   . History of cardiac cath     a. 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  Asthma   . Rheumatoid arthritis(714.0)   . Basal cell carcinoma   . Hyperplastic colonic polyp 2003  . History of gout   . COPD (chronic obstructive pulmonary disease) ([place])   . Diastolic dysfunction     a. echo 07/2014: EF 55-60%, no RWMA, GR2DD, mild MR, LA moderately dilated, PASP 38 mm Hg  . Sleep apnea     a. not compliant with CPAP    Past Surgical History  Procedure Laterality Date  . Cardiac catheterization  05/24/2010    nonobstructive CAD  . Knee arthroscopy      bilateral  . Vaginal hysterectomy    . Cholecystectomy    . Cesarean section    . Colonoscopy  06/12/2011    Procedure: COLONOSCOPY;  Surgeon: Juanita Craver, MD;  Location: [facility];  Service: Endoscopy;  Laterality: N/A;  . Colonoscopy N/A 03/17/2013    Procedure: COLONOSCOPY;  Surgeon: Juanita Craver, MD;  Location: [facility];  Service: Endoscopy;  Laterality: N/A;    Family History  Problem Relation Age of Onset  . Tuberculosis Mother   . Parkinsonism Mother   . Diabetes type II Sister   . Breast cancer Sister   . Breast cancer Maternal Aunt   . Emphysema Father     smoked  . Tuberculosis Sister     Social History   Social History  . Marital Status: Married    Spouse Name: N/A  . Number of Children: N/A  . Years of Education: N/A   Occupational History  . Retired    Social History Main Topics  . Smoking status: Never Smoker   . Smokeless tobacco: Never Used  . Alcohol Use: No  . Drug Use: No  . Sexual Activity: Not on file   Other Topics Concern  . Not on file   Social History Narrative   The PMH, PSH, Social History, Family History, Medications, and allergies have been reviewed in [facility], and have been updated if relevant.  Review of Systems  Respiratory: Negative for cough and shortness of breath.   Musculoskeletal: Positive for back pain and joint swelling.       Objective:    BP 140/84 mmHg  Pulse 77  Temp(Src) 97.5 F (36.4 C) (Oral)  Wt 323 lb (146.512 kg)  SpO2  95% Wt Readings from Last 3 Encounters:  06/15/15 323 lb (146.512 kg)  06/15/15 324 lb (146.965 kg)  05/07/15 320 lb 9.6 oz (145.423 kg)     Physical Exam  Constitutional: She is oriented to person, place, and time. She appears well-developed and well-nourished. No distress.  Morbidly obese  HENT:  Head: Normocephalic and atraumatic.  Eyes: Conjunctivae are normal.  Cardiovascular: Normal rate.   Pulmonary/Chest: Effort normal.  Neurological: She is alert and oriented to person, place, and time.  Skin: Skin is warm and dry.  Psychiatric: She has a normal mood and affect. Her behavior is normal. Judgment and thought content normal.  Nursing note and vitals reviewed.         Assessment & Plan:   Osteoarthritis, unspecified osteoarthritis type, unspecified site  DDD (degenerative disc disease), lumbosacral  Bilateral low back pain, with sciatica presence unspecified No Follow-up on file.

## 2015-06-15 NOTE — Assessment & Plan Note (Signed)
Recent flare now resolved  Patient's medications were reviewed today and patient education was given. Computerized medication calendar was adjusted/completed   Plan  Prevnar Vaccine today .  Follow med calendar closely and bring to each visit.  Continue on current regimen .  Follow up with Dr. Melvyn Novas  In 3 months  and As needed

## 2015-06-15 NOTE — Progress Notes (Signed)
Subjective:    Patient ID: Tina Pacheco, female    DOB: [birth year],     MRN: [id number]    Brief patient profile:  84 yowf never smoker with tendency to recurrent sinus infections> going into chest x 1990 with recurrence in Sept 2016 and added   symbicort and singulair and still needed tussionex until first week in November 2016 better so continued singulair daily but changed  symbicort to prn but then bad chest  tightness starting mid December assoc with severe dry cough>  no better with symbicort 160 maint (though hfa poor) so referred 02/26/2015 to pulmonary clinic by Dr Marjory Lies.    History of Present Illness  02/26/2015 1st [place] [facility] office visit/ [facility]   Chief Complaint  Patient presents with  . Pulmonary Consult    Referred by Dr. Marjory Lies. Pt c/o difficulty breathing "for years"- worse x 2 wks. She c/o chest pressure, non prod cough and DOE with very minimal exertion x 2 wks. She also c/o swelling in her legs.   cough is dry with sensation of something stuck in throat worse with activity / no change with perfume exp or eating/ assoc with sense of nasal congestion, sore throat and dysphagia worse day than noct  rec Pantoprazole 40 mg  Take  30-60 min before first meal of the day and Pepcid (famotidine)  20 mg one @  bedtime until return to office   GERD  Diet  Prednisone 10 mg take  4 each am x 2 days,   2 each am x 2 days,  1 each am x 2 days and stop   Start on Jan 2>> dulera 100 Take 2 puffs first thing in am and then another 2 puffs about 12 hours later.  Work on inhaler technique:    Take delsym two tsp every 12 hours and supplement if needed with  tramadol 50 mg up to 2 every 4 hours   Please schedule a follow up office visit in 3 weeks, sooner if needed > bring all medications with you     03/22/2015  f/u ov/Wert re: recurrent cough since 10/2014  did not bring all meds / did not stay on dulera  Chief Complaint  Patient presents with  . Follow-up    Cough had  completely resovled 1 wk after last visit, until a few days ago started sneezing and coughing again. Her breathing has improved. She has not needed albuterol since the last visit.   not clear she really improved s need for tramadol before recurred, confused with maint vs prns rec GERD  Diet  Prednisone 10 mg take  4 each am x 2 days,   2 each am x 2 days,  1 each am x 2 days and stop   Start on Jan 27 >> dulera 100 Take 2 puffs first thing in am and then another 2 puffs about 12 hours later.  For drainage / throat tickle try take CHLORPHENIRAMINE  4 mg - take one every 4 hours as needed -   Work on inhaler technique:  relax and gently blow all the way out then take a nice smooth deep breath back in, triggering the inhaler at same time you start breathing in.  Hold for up to 5 seconds if you can. Blow out thru nose. Rinse and gargle with water when done Take delsym two tsp every 12 hours and supplement if needed with  tramadol 50 mg up to 2 every 4 hours  Once  you have eliminated the cough for 3 straight days try reducing the tramadol first,  then the delsym as tolerated  04/19/15 NP ov Still some mild dry cough but much better  We are setting you up for a HRCT Chest .  Follow med calendar closely and bring to each visit.      05/07/2015  f/u ov/Wert re:  Chronic cough x 1990's  Worse since sept 2016 / no med calendar(doesn't recognize it)  Chief Complaint  Patient presents with  . Follow-up    Pt c/o cough with yellow mucus, wheezing, SOB, sinus pain/pressure, evening chill/sweats and some nausea since this past weekend. Pt was seen by PCP Tuesday and was given Hycodan and a Zpak. Pt denies CP/tightness. Pt reports that she has not been eating and drinking well.   continued delsym despite "all better"  but no need for tramadol then  acutely ill 04/03/15 then saw [facility] 2/7 zpak / hydrocodone > improved but not back to baseline Stopped roxicodone at onset of cough because took to bed and back pain  not a concern when not up but never took the tramdol that was rec prn for cough    >Pred taper   06/15/2015 Follow up :  Pt returns for 1 month follow up .  Patient was seen last visit with a cough flare. She was treated with a prednisone taper. She says that she is feeling better. Cough has decreased. She can now take in a deep breath without coughing. We reviewed all her medications organize them into a medication count with patient education. Appears that she is taking her medications correctly. She denies any hemoptysis, orthopnea, PND, or increased leg swelling.   Current Medications, Allergies, Complete Past Medical History, Past Surgical History, Family History, and Social History were reviewed in [employer] record.  ROS  The following are not active complaints unless bolded sore throat, dysphagia, dental problems, itching, sneezing,  or excess/ purulent secretions, ear ache,   fever,  unintended wt loss, classically pleuritic or exertional cp,  orthopnea pnd or leg swelling, presyncope, palpitations, abdominal pain, anorexia,  vomiting, diarrhea  or change in bowel or bladder habits, change in stools or urine, dysuria,hematuria,  rash, arthralgias, visual complaints, headache, numbness, weakness or ataxia or problems with walking or coordination,  change in mood/affect or memory.               Objective:   Physical Exam Obese female in nad.  Filed Vitals:   06/15/15 0950  BP: 116/84  Pulse: 71  Temp: 97.4 F (36.3 C)  TempSrc: Oral  Height: 5\' 4"  (1.626 m)  Weight: 324 lb (146.965 kg)  SpO2: 97%     .05/07/2015       321   Vital signs reviewed  HEENT: nl dentition, turbinates, and oropharynx. Nl external ear canals without cough reflex   NECK :  without JVD/Nodes/TM/ nl carotid upstrokes bilaterally   LUNGS: no acc muscle use,  Nl contour chest which is clear to A and P bilaterally without cough on insp or exp maneuvers -     CV:  RRR  no s3  or murmur or increase in P2,  Trace bilateral pitting lower ext  edema  ABD:  Massive but soft and nontender   No bruits or organomegaly, bowel sounds nl  MS:  Nl gait/ ext warm without deformities, calf tenderness, cyanosis or clubbing No obvious joint restrictions   SKIN: warm and dry without lesions  NEURO:  alert, approp, nl sensorium with  no motor deficits    Tammy Parrett NP-C  [facility]  06/15/2015      Assessment & Plan:

## 2015-06-15 NOTE — Telephone Encounter (Signed)
LMTCB

## 2015-06-15 NOTE — Assessment & Plan Note (Signed)
>  25 minutes spent in face to face time with patient, >50% spent in counselling or coordination of care She is being followed by ortho (records requested) but I am prescribing her narcotics for this.  She is aware that this is not ideal and weight loss is imperative.  We discussed this again today.  She will look into myfitness pal and other apps that are free.  She is still not interested in bariatric sugery.

## 2015-06-15 NOTE — Progress Notes (Signed)
Pre visit review using our clinic review tool, if applicable. No additional management support is needed unless otherwise documented below in the visit note. 

## 2015-06-15 NOTE — Patient Instructions (Addendum)
Prevnar Vaccine today .  Follow med calendar closely and bring to each visit.  Continue on current regimen .  Follow up with Dr. Melvyn Novas  In 3 months  and As needed

## 2015-06-15 NOTE — Telephone Encounter (Signed)
-----   Message from Tanda Rockers, MD sent at 06/15/2015  2:05 PM EDT ----- Set up for earlier ov eg 4 weeks to review progress, med calendar and ct chest results but not change rx in meantime

## 2015-06-16 NOTE — Telephone Encounter (Signed)
Called and spoke to pt. Informed her of the change in f/u. Appt made with MW on 07/13/15. Pt verbalized understanding and denied any further questions or concerns at this time.

## 2015-06-18 DIAGNOSIS — J301 Allergic rhinitis due to pollen: Secondary | ICD-10-CM | POA: Diagnosis not present

## 2015-06-24 DIAGNOSIS — M1A09X0 Idiopathic chronic gout, multiple sites, without tophus (tophi): Secondary | ICD-10-CM | POA: Diagnosis not present

## 2015-06-24 DIAGNOSIS — M0589 Other rheumatoid arthritis with rheumatoid factor of multiple sites: Secondary | ICD-10-CM | POA: Diagnosis not present

## 2015-06-24 DIAGNOSIS — M150 Primary generalized (osteo)arthritis: Secondary | ICD-10-CM | POA: Diagnosis not present

## 2015-06-24 DIAGNOSIS — M797 Fibromyalgia: Secondary | ICD-10-CM | POA: Diagnosis not present

## 2015-06-24 DIAGNOSIS — Z79899 Other long term (current) drug therapy: Secondary | ICD-10-CM | POA: Diagnosis not present

## 2015-07-02 DIAGNOSIS — H16293 Other keratoconjunctivitis, bilateral: Secondary | ICD-10-CM | POA: Diagnosis not present

## 2015-07-08 ENCOUNTER — Other Ambulatory Visit: Payer: Self-pay | Admitting: Family Medicine

## 2015-07-12 ENCOUNTER — Ambulatory Visit (INDEPENDENT_AMBULATORY_CARE_PROVIDER_SITE_OTHER): Payer: Medicare Other | Admitting: Family Medicine

## 2015-07-12 ENCOUNTER — Encounter: Payer: Self-pay | Admitting: Family Medicine

## 2015-07-12 VITALS — BP 158/84 | HR 72 | Temp 98.1°F | Wt 333.0 lb

## 2015-07-12 DIAGNOSIS — M170 Bilateral primary osteoarthritis of knee: Secondary | ICD-10-CM | POA: Diagnosis not present

## 2015-07-12 DIAGNOSIS — M1710 Unilateral primary osteoarthritis, unspecified knee: Secondary | ICD-10-CM | POA: Insufficient documentation

## 2015-07-12 DIAGNOSIS — M25562 Pain in left knee: Secondary | ICD-10-CM

## 2015-07-12 DIAGNOSIS — M179 Osteoarthritis of knee, unspecified: Secondary | ICD-10-CM | POA: Insufficient documentation

## 2015-07-12 NOTE — Progress Notes (Signed)
SUBJECTIVE: Tina Pacheco is a 65 y.o. female who sustained a left knee injury 2 day(s) ago. Mechanism of injury: twisted her knee and fell. Immediate symptoms: immediate pain, inability to bear weight directly after injury. Symptoms have been constant since that time. Prior history of related problems:known end stage OA of both knees.  CLINICAL DATA: Knee pain.  EXAM: RIGHT KNEE - COMPLETE 4+ VIEW  COMPARISON: None.  FINDINGS: Prominent degenerative changes right knee. Degenerative changes most prominent about the medial compartment. No evidence of fracture or dislocation.  IMPRESSION: Severe degenerative changes right knee. The degenerative changes are most prominent about the medial compartment .   Electronically Signed  By: Marcello Moores Register  On: 09/08/2014 11:43 Current Outpatient Prescriptions on File Prior to Visit  Medication Sig Dispense Refill  . amLODipine (NORVASC) 5 MG tablet Take 1 tablet (5 mg total) by mouth daily. (Patient taking differently: Take 5 mg by mouth every morning. ) 90 tablet 3  . atorvastatin (LIPITOR) 20 MG tablet Take 1 tablet (20 mg total) by mouth daily. (Patient taking differently: Take 20 mg by mouth at bedtime. ) 90 tablet 3  . chlorpheniramine (CHLOR-TRIMETON) 4 MG tablet Take 4 mg by mouth every 4 (four) hours as needed (for drippy nose, drainage, and throat clearing).    . dabigatran (PRADAXA) 150 MG CAPS capsule Take 1 capsule (150 mg total) by mouth 2 (two) times daily. 180 capsule 3  . dextromethorphan (DELSYM) 30 MG/5ML liquid Take 2 tsp twice daily as needed for cough    . famotidine (PEPCID) 20 MG tablet One at bedtime (Patient taking differently: Take 20 mg by mouth at bedtime. ) 30 tablet 11  . Febuxostat (ULORIC PO) Take 40 mg by mouth every morning.     . fluticasone (FLONASE) 50 MCG/ACT nasal spray Place 2 sprays into both nostrils daily as needed (nasal stuffiness).    . gabapentin (NEURONTIN) 300 MG capsule Take 300 mg  by mouth 3 (three) times daily.    . hydroxychloroquine (PLAQUENIL) 200 MG tablet Take by mouth 2 (two) times daily.    Marland Kitchen KLOR-CON M20 20 MEQ tablet TAKE 1/2 TABLET (=10MEQ)   TWICE A DAY; MAY TAKE AN   EXTRA TABLET WHEN TAKING   TORSEMIDE (Patient taking differently: TAKE 1/2 TABLET (=10MEQ)   TWICE A DAY) 180 tablet 3  . leflunomide (ARAVA) 20 MG tablet Take 20 mg by mouth every morning.     . metoprolol succinate (TOPROL-XL) 50 MG 24 hr tablet take 1 tablet by mouth once daily 30 tablet 3  . montelukast (SINGULAIR) 10 MG tablet take 1 tablet by mouth at bedtime 30 tablet 3  . Multiple Vitamin (MULTIVITAMIN) tablet Take 1 tablet by mouth every morning.     Marland Kitchen oxyCODONE (ROXICODONE) 5 MG immediate release tablet Take 2 tablets by mouth every 12 hours 270 tablet 0  . pantoprazole (PROTONIX) 40 MG tablet Take 40 mg by mouth daily before breakfast.     . pregabalin (LYRICA) 150 MG capsule Take 150 mg by mouth 3 (three) times daily.    Marland Kitchen Respiratory Therapy Supplies (FLUTTER) DEVI Use as directed 1 each 0  . terazosin (HYTRIN) 10 MG capsule take 1 capsule by mouth at bedtime 90 capsule 2  . torsemide (DEMADEX) 20 MG tablet Take 1 tablet (20 mg total) by mouth 2 (two) times daily. Edema (Patient taking differently: Take 20 mg by mouth 2 (two) times daily as needed. ) 60 tablet 3  . mometasone-formoterol (DULERA) 100-5  MCG/ACT AERO Inhale 2 puffs into the lungs 2 (two) times daily. 1 Inhaler 0   No current facility-administered medications on file prior to visit.    No Known Allergies  Past Medical History  Diagnosis Date  . Hypertension   . Dyslipidemia   . PAF (paroxysmal atrial fibrillation) (HCC)     a. on Pradaxa; b. CHADSVASc at least 2 (HTN & female)  . Diastolic dysfunction   . Morbid obesity ([place])   . Hypokalemia   . History of cardiac cath     a. 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 Asthma   . Rheumatoid arthritis(714.0)   . Basal cell carcinoma   . Hyperplastic  colonic polyp 2003  . History of gout   . COPD (chronic obstructive pulmonary disease) ([place])   . Diastolic dysfunction     a. echo 07/2014: EF 55-60%, no RWMA, GR2DD, mild MR, LA moderately dilated, PASP 38 mm Hg  . Sleep apnea     a. not compliant with CPAP    Past Surgical History  Procedure Laterality Date  . Cardiac catheterization  05/24/2010    nonobstructive CAD  . Knee arthroscopy      bilateral  . Vaginal hysterectomy    . Cholecystectomy    . Cesarean section    . Colonoscopy  06/12/2011    Procedure: COLONOSCOPY;  Surgeon: Juanita Craver, MD;  Location: [facility];  Service: Endoscopy;  Laterality: N/A;  . Colonoscopy N/A 03/17/2013    Procedure: COLONOSCOPY;  Surgeon: Juanita Craver, MD;  Location: [facility];  Service: Endoscopy;  Laterality: N/A;    Family History  Problem Relation Age of Onset  . Tuberculosis Mother   . Parkinsonism Mother   . Diabetes type II Sister   . Breast cancer Sister   . Breast cancer Maternal Aunt   . Emphysema Father     smoked  . Tuberculosis Sister     Social History   Social History  . Marital Status: Married    Spouse Name: N/A  . Number of Children: N/A  . Years of Education: N/A   Occupational History  . Retired    Social History Main Topics  . Smoking status: Never Smoker   . Smokeless tobacco: Never Used  . Alcohol Use: No  . Drug Use: No  . Sexual Activity: Not on file   Other Topics Concern  . Not on file   Social History Narrative   The PMH, PSH, Social History, Family History, Medications, and allergies have been reviewed in [facility], and have been updated if relevant.  OBJECTIVE: BP 158/84 mmHg  Pulse 72  Temp(Src) 98.1 F (36.7 C) (Oral)  Wt 333 lb (151.048 kg)  SpO2 91%  Vital signs as noted above. Appearance: obese, NAD. Knee exam: difficult to assess due to body habitus, inability to bear weight without pain.   ASSESSMENT: Knee internal derangement and underlying chronic DDD  likely  PLAN: rest the injured area as much as practical, referral to Orthopedics for this injury See orders for this visit as documented in the electronic medical record.

## 2015-07-12 NOTE — Progress Notes (Signed)
Pre visit review using our clinic review tool, if applicable. No additional management support is needed unless otherwise documented below in the visit note. 

## 2015-07-12 NOTE — Patient Instructions (Signed)
Great to see you. Please stop by to see Tina Pacheco on your way out.   

## 2015-07-13 ENCOUNTER — Encounter: Payer: Self-pay | Admitting: Internal Medicine

## 2015-07-13 ENCOUNTER — Ambulatory Visit (INDEPENDENT_AMBULATORY_CARE_PROVIDER_SITE_OTHER): Payer: Medicare Other | Admitting: Internal Medicine

## 2015-07-13 VITALS — BP 122/76 | HR 68 | Ht 64.0 in | Wt 343.8 lb

## 2015-07-13 DIAGNOSIS — J479 Bronchiectasis, uncomplicated: Secondary | ICD-10-CM

## 2015-07-13 DIAGNOSIS — J45991 Cough variant asthma: Secondary | ICD-10-CM

## 2015-07-13 NOTE — Assessment & Plan Note (Signed)
HRCT chest 05/13/15  There is airway thickening with cylindrical bronchiectasis most notable in the lower lobes, along with a fairly mild mosaic attenuation pattern which seems somewhat exacerbated on the expiratory images, favoring air trapping due to bronchiolitis. Hypersensitivity pneumonitis or nonspecific interstitial pneumonia considered less likely.  I had an extended final summary discussion with the patient reviewing all relevant studies completed to date and  lasting 15 to 20 minutes of a 25 minute visit on the following issues:    1) pathophysiology of bronchiectasis reviewed  2) use of "prn" dulera 100 2bid ok in this setting, moved to prn part of her med calendar  3) for cough changed delsym to mucinex dm 1200 mg every 12 hours  4)  Each maintenance medication was reviewed in detail including most importantly the difference between maintenance and as needed and under what circumstances the prns are to be used. This was done in the context of a medication calendar review which provided the patient with a user-friendly unambiguous mechanism for medication administration and reconciliation and provides an action plan for all active problems. It is critical that this be shown to every doctor  for modification during the office visit if necessary so the patient can use it as a working document.

## 2015-07-13 NOTE — Assessment & Plan Note (Signed)
05/07/2015  extensive coaching HFA effectiveness =    75%  - Sinus CT 05/09/2015 > chronic sinusitis  Doing better on max gerd rx/ flutter prn > f/u can be prn but note this is very difficult to sort out from bronchiectasis related coughing and she is at risk for both

## 2015-07-13 NOTE — Patient Instructions (Addendum)
Ok to just take the dulera 100 up 2 puffs every 12 hours if having trouble with breaths or the cough persists   See calendar for specific medication instructions and bring it back for each and every office visit for every healthcare provider you see.  Without it,  you may not receive the best quality medical care that we feel you deserve.  You will note that the calendar groups together  your maintenance  medications that are timed at particular times of the day.  Think of this as your checklist for what your doctor has instructed you to do until your next evaluation to see what benefit  there is  to staying on a consistent group of medications intended to keep you well.  The other group at the bottom is entirely up to you to use as you see fit  for specific symptoms that may arise between visits that require you to treat them on an as needed basis.  Think of this as your action plan or "what if" list.   Separating the top medications from the bottom group is fundamental to providing you adequate care going forward.     If you are satisfied with your treatment plan,  let your doctor know and he/she can either refill your medications or you can return here when your prescription runs out.     If in any way you are not 100% satisfied,  please tell us.  If 100% better, tell your friends!  Pulmonary follow up is as needed

## 2015-07-13 NOTE — Progress Notes (Signed)
Subjective:    Patient ID: Tina Pacheco, female    DOB: [birth year],     MRN: [id number]    Brief patient profile:  48 yowf never smoker with tendency to recurrent sinus infections> going into chest x 1990 with recurrence in Sept 2016 and added symbicort and singulair and still needed tussionex until first week in November 2016 better so continued singulair daily but changed  symbicort to prn but then bad chest  tightness starting mid December assoc with severe dry cough>  no better with symbicort 160 maint (though hfa poor) so referred 02/26/2015 to pulmonary clinic by Dr Marjory Lies.    History of Present Illness  02/26/2015 1st [facility] office visit/ Meet Tina Pacheco   Chief Complaint  Patient presents with  . Pulmonary Consult    Referred by Dr. Marjory Lies. Pt c/o difficulty breathing "for years"- worse x 2 wks. She c/o chest pressure, non prod cough and DOE with very minimal exertion x 2 wks. She also c/o swelling in her legs.   cough is dry with sensation of something stuck in throat worse with activity / no change with perfume exp or eating/ assoc with sense of nasal congestion, sore throat and dysphagia worse day than noct  rec Pantoprazole 40 mg  Take  30-60 min before first meal of the day and Pepcid (famotidine)  20 mg one @  bedtime until return to office   GERD  Diet  Prednisone 10 mg take  4 each am x 2 days,   2 each am x 2 days,  1 each am x 2 days and stop   Start on Jan 2>> dulera 100 Take 2 puffs first thing in am and then another 2 puffs about 12 hours later.  Work on inhaler technique:    Take delsym two tsp every 12 hours and supplement if needed with  tramadol 50 mg up to 2 every 4 hours   Please schedule a follow up office visit in 3 weeks, sooner if needed > bring all medications with you     03/22/2015  f/u ov/Tina Pacheco re: recurrent cough since 10/2014  did not bring all meds / did not stay on dulera  Chief Complaint  Patient presents with  . Follow-up    Cough had  completely resovled 1 wk after last visit, until a few days ago started sneezing and coughing again. Her breathing has improved. She has not needed albuterol since the last visit.   not clear she really improved s need for tramadol before recurred, confused with maint vs prns rec GERD  Diet  Prednisone 10 mg take  4 each am x 2 days,   2 each am x 2 days,  1 each am x 2 days and stop   Start on Jan 27 >> dulera 100 Take 2 puffs first thing in am and then another 2 puffs about 12 hours later.  For drainage / throat tickle try take CHLORPHENIRAMINE  4 mg - take one every 4 hours as needed -   Work on inhaler technique:  relax and gently blow all the way out then take a nice smooth deep breath back in, triggering the inhaler at same time you start breathing in.  Hold for up to 5 seconds if you can. Blow out thru nose. Rinse and gargle with water when done Take delsym two tsp every 12 hours and supplement if needed with  tramadol 50 mg up to 2 every 4 hours  Once you have  eliminated the cough for 3 straight days try reducing the tramadol first,  then the delsym as tolerated     05/07/2015  f/u ov/Tina Pacheco re:  Chronic cough x 1990's  Worse since sept 2016 / no med calendar(doesn't recognize it)  Chief Complaint  Patient presents with  . Follow-up    Pt c/o cough with yellow mucus, wheezing, SOB, sinus pain/pressure, evening chill/sweats and some nausea since this past weekend. Pt was seen by PCP Tuesday and was given Hycodan and a Zpak. Pt denies CP/tightness. Pt reports that she has not been eating and drinking well.   continued delsym despite "all better"  but no need for tramadol then  acutely ill 04/03/15 then saw Tina Pacheco 2/7 zpak / hydrocodone > improved but not back to baseline Stopped roxicodone at onset of cough because took to bed and back pain not a concern when not up but never took the tramdol that was rec prn for cough    >Pred taper   06/15/2015 NP Follow up :  Pt returns for 1 month follow  up .  Patient was seen last visit with a cough flare. She was treated with a prednisone taper. She says that she is feeling better. Cough has decreased. She can now take in a deep breath without coughing. We reviewed all her medications organize them into a medication count with patient education. Appears that she is taking her medications correctly. rec Prevnar Vaccine today .  Follow med calendar closely and bring to each visit.  Continue on current regimen     07/13/2015  f/u ov/Tina Pacheco re: chronic cough /bronchiectasis - not using calendar, weaned off dulera100 s flare  Chief Complaint  Patient presents with  . Follow-up    Breathing is doing well. No new co's today.   really Not limited by breathing from desired activities  But by arthritis   No obvious day to day or daytime variability or assoc excess/ purulent sputum or mucus plugs or hemoptysis or cp or chest tightness, subjective wheeze or overt sinus or hb symptoms. No unusual exp hx or h/o childhood pna/ asthma or knowledge of premature birth.  Sleeping ok without nocturnal  or early am exacerbation  of respiratory  c/o's or need for noct saba. Also denies any obvious fluctuation of symptoms with weather or environmental changes or other aggravating or alleviating factors except as outlined above   Current Medications, Allergies, Complete Past Medical History, Past Surgical History, Family History, and Social History were reviewed in [employer] record.  ROS  The following are not active complaints unless bolded sore throat, dysphagia, dental problems, itching, sneezing,  nasal congestion or excess/ purulent secretions, ear ache,   fever, chills, sweats, unintended wt loss, classically pleuritic or exertional cp,  orthopnea pnd or leg swelling, presyncope, palpitations, abdominal pain, anorexia, nausea, vomiting, diarrhea  or change in bowel or bladder habits, change in stools or urine, dysuria,hematuria,  rash,  arthralgias, visual complaints, headache, numbness, weakness or ataxia or problems with walking or coordination,  change in mood/affect or memory.            Objective:   Physical Exam   Obese female in nad.      Wt Readings from Last 3 Encounters:  07/13/15 343 lb 12.8 oz (155.947 kg)  07/12/15 333 lb (151.048 kg)  06/15/15 323 lb (146.512 kg)    Vital signs reviewed        HEENT: nl dentition, turbinates, and oropharynx. Nl external  ear canals without cough reflex   NECK :  without JVD/Nodes/TM/ nl carotid upstrokes bilaterally   LUNGS: no acc muscle use,  Nl contour chest which is clear to A and P bilaterally without cough on insp or exp maneuvers -     CV:  RRR  no s3 or murmur or increase in P2,  Trace bilateral pitting lower ext  edema  ABD:  Massive but soft and nontender   No bruits or organomegaly, bowel sounds nl  MS:  Nl gait/ ext warm without deformities, calf tenderness, cyanosis or clubbing No obvious joint restrictions   SKIN: warm and dry without lesions    NEURO:  alert, approp, nl sensorium with  no motor deficits        Assessment & Plan:

## 2015-07-14 DIAGNOSIS — M170 Bilateral primary osteoarthritis of knee: Secondary | ICD-10-CM | POA: Diagnosis not present

## 2015-07-20 ENCOUNTER — Other Ambulatory Visit: Payer: Self-pay | Admitting: Cardiovascular Disease

## 2015-07-22 DIAGNOSIS — D485 Neoplasm of uncertain behavior of skin: Secondary | ICD-10-CM | POA: Diagnosis not present

## 2015-07-22 DIAGNOSIS — D1801 Hemangioma of skin and subcutaneous tissue: Secondary | ICD-10-CM | POA: Diagnosis not present

## 2015-07-30 DIAGNOSIS — H16223 Keratoconjunctivitis sicca, not specified as Sjogren's, bilateral: Secondary | ICD-10-CM | POA: Diagnosis not present

## 2015-07-30 DIAGNOSIS — H524 Presbyopia: Secondary | ICD-10-CM | POA: Diagnosis not present

## 2015-07-30 DIAGNOSIS — H04123 Dry eye syndrome of bilateral lacrimal glands: Secondary | ICD-10-CM | POA: Diagnosis not present

## 2015-07-30 DIAGNOSIS — Z79899 Other long term (current) drug therapy: Secondary | ICD-10-CM | POA: Diagnosis not present

## 2015-08-02 ENCOUNTER — Encounter: Payer: Self-pay | Admitting: Family Medicine

## 2015-08-02 ENCOUNTER — Ambulatory Visit (INDEPENDENT_AMBULATORY_CARE_PROVIDER_SITE_OTHER): Payer: Medicare Other | Admitting: Family Medicine

## 2015-08-02 VITALS — BP 136/76 | HR 65 | Temp 97.5°F | Wt 311.5 lb

## 2015-08-02 DIAGNOSIS — Z79891 Long term (current) use of opiate analgesic: Secondary | ICD-10-CM | POA: Diagnosis not present

## 2015-08-02 DIAGNOSIS — M170 Bilateral primary osteoarthritis of knee: Secondary | ICD-10-CM

## 2015-08-02 MED ORDER — OXYCODONE HCL 5 MG PO TABS: ORAL_TABLET | ORAL | Status: DC

## 2015-08-02 NOTE — Progress Notes (Signed)
Subjective:   Patient ID: Tina Pacheco, female    DOB: 03/26/1950, 65 y.o.   MRN: [id number]  Tina Pacheco is a pleasant 65 y.o. year old female with complicated medical history, well known to me, who presents to clinic today with Leg Pain  on 08/02/2015  HPI: ll.  Chronic pain- known DDD of spine, OA of both knees and RA. Followed by rheum and ortho.  Saw ortho last week, was told she has no cartilage in her knee but could not be a candidate for knee surgery until she lost 100 pounds.  Morbid obesity-we have discussed that her weight is a contributing factor in her chronic pain.  She has tried weight watchers in the past.  She also looked into nutri system but it is cost prohibitive.  Current Outpatient Prescriptions on File Prior to Visit  Medication Sig Dispense Refill  . amLODipine (NORVASC) 5 MG tablet Take 1 tablet (5 mg total) by mouth daily. (Patient taking differently: Take 5 mg by mouth every morning. ) 90 tablet 3  . atorvastatin (LIPITOR) 20 MG tablet Take 1 tablet (20 mg total) by mouth daily. (Patient taking differently: Take 20 mg by mouth at bedtime. ) 90 tablet 3  . chlorpheniramine (CHLOR-TRIMETON) 4 MG tablet Take 4 mg by mouth every 4 (four) hours as needed (for drippy nose, drainage, and throat clearing).    . dabigatran (PRADAXA) 150 MG CAPS capsule Take 1 capsule (150 mg total) by mouth 2 (two) times daily. 180 capsule 3  . dextromethorphan (DELSYM) 30 MG/5ML liquid Take 2 tsp twice daily as needed for cough    . famotidine (PEPCID) 20 MG tablet One at bedtime (Patient taking differently: Take 20 mg by mouth at bedtime. ) 30 tablet 11  . Febuxostat (ULORIC PO) Take 40 mg by mouth every morning.     . fluticasone (FLONASE) 50 MCG/ACT nasal spray Place 2 sprays into both nostrils daily as needed (nasal stuffiness).    . gabapentin (NEURONTIN) 300 MG capsule Take 300 mg by mouth 3 (three) times daily.    . hydroxychloroquine (PLAQUENIL) 200 MG tablet Take by mouth  2 (two) times daily.    Marland Kitchen KLOR-CON M20 20 MEQ tablet TAKE 1/2 TABLET (=10MEQ)   TWICE A DAY; MAY TAKE AN   EXTRA TABLET WHEN TAKING   TORSEMIDE (Patient taking differently: TAKE 1/2 TABLET (=10MEQ)   TWICE A DAY) 180 tablet 3  . leflunomide (ARAVA) 20 MG tablet Take 20 mg by mouth every morning.     . metoprolol succinate (TOPROL-XL) 50 MG 24 hr tablet take 1 tablet by mouth once daily 30 tablet 3  . montelukast (SINGULAIR) 10 MG tablet take 1 tablet by mouth at bedtime 30 tablet 3  . Multiple Vitamin (MULTIVITAMIN) tablet Take 1 tablet by mouth every morning.     Marland Kitchen oxyCODONE (ROXICODONE) 5 MG immediate release tablet Take 2 tablets by mouth every 12 hours 270 tablet 0  . pantoprazole (PROTONIX) 40 MG tablet Take 40 mg by mouth daily before breakfast.     . pregabalin (LYRICA) 150 MG capsule Take 150 mg by mouth 3 (three) times daily.    Marland Kitchen Respiratory Therapy Supplies (FLUTTER) DEVI Use as directed 1 each 0  . terazosin (HYTRIN) 10 MG capsule take 1 capsule by mouth at bedtime 90 capsule 2  . torsemide (DEMADEX) 20 MG tablet Take 1 tablet (20 mg total) by mouth 2 (two) times daily. Edema (Patient taking differently: Take 20 mg  by mouth 2 (two) times daily as needed. ) 60 tablet 3  . traMADol (ULTRAM) 50 MG tablet Take 50 mg by mouth every 4 (four) hours as needed.    . mometasone-formoterol (DULERA) 100-5 MCG/ACT AERO Inhale 2 puffs into the lungs 2 (two) times daily. 1 Inhaler 0   No current facility-administered medications on file prior to visit.    No Known Allergies  Past Medical History  Diagnosis Date  . Hypertension   . Dyslipidemia   . PAF (paroxysmal atrial fibrillation) (HCC)     a. on Pradaxa; b. CHADSVASc at least 2 (HTN & female)  . Diastolic dysfunction   . Morbid obesity ([place])   . Hypokalemia   . History of cardiac cath     a. 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 Asthma   . Rheumatoid arthritis(714.0)   . Basal cell carcinoma   . Hyperplastic colonic polyp  2003  . History of gout   . COPD (chronic obstructive pulmonary disease) ([place])   . Diastolic dysfunction     a. echo 07/2014: EF 55-60%, no RWMA, GR2DD, mild MR, LA moderately dilated, PASP 38 mm Hg  . Sleep apnea     a. not compliant with CPAP    Past Surgical History  Procedure Laterality Date  . Cardiac catheterization  05/24/2010    nonobstructive CAD  . Knee arthroscopy      bilateral  . Vaginal hysterectomy    . Cholecystectomy    . Cesarean section    . Colonoscopy  06/12/2011    Procedure: COLONOSCOPY;  Surgeon: Juanita Craver, MD;  Location: [facility];  Service: Endoscopy;  Laterality: N/A;  . Colonoscopy N/A 03/17/2013    Procedure: COLONOSCOPY;  Surgeon: Juanita Craver, MD;  Location: [facility];  Service: Endoscopy;  Laterality: N/A;    Family History  Problem Relation Age of Onset  . Tuberculosis Mother   . Parkinsonism Mother   . Diabetes type II Sister   . Breast cancer Sister   . Breast cancer Maternal Aunt   . Emphysema Father     smoked  . Tuberculosis Sister     Social History   Social History  . Marital Status: Married    Spouse Name: N/A  . Number of Children: N/A  . Years of Education: N/A   Occupational History  . Retired    Social History Main Topics  . Smoking status: Never Smoker   . Smokeless tobacco: Never Used  . Alcohol Use: No  . Drug Use: No  . Sexual Activity: Not on file   Other Topics Concern  . Not on file   Social History Narrative   The PMH, PSH, Social History, Family History, Medications, and allergies have been reviewed in [facility], and have been updated if relevant.  Review of Systems  Respiratory: Negative for cough and shortness of breath.   Musculoskeletal: Positive for back pain and joint swelling.       Objective:    BP 136/76 mmHg  Pulse 65  Temp(Src) 97.5 F (36.4 C) (Oral)  Wt 311 lb 8 oz (141.295 kg)  SpO2 95% Wt Readings from Last 3 Encounters:  08/02/15 311 lb 8 oz (141.295 kg)  07/13/15 343 lb  12.8 oz (155.947 kg)  07/12/15 333 lb (151.048 kg)     Physical Exam  Constitutional: She is oriented to person, place, and time. She appears well-developed and well-nourished. No distress.  Morbidly obese  HENT:  Head: Normocephalic and atraumatic.  Eyes: Conjunctivae are normal.  Cardiovascular: Normal rate.   Pulmonary/Chest: Effort normal.  Neurological: She is alert and oriented to person, place, and time.  Skin: Skin is warm and dry.  Psychiatric: She has a normal mood and affect. Her behavior is normal. Judgment and thought content normal.  Nursing note and vitals reviewed.         Assessment & Plan:   Osteoarthritis of both knees, unspecified osteoarthritis type No Follow-up on file.

## 2015-08-02 NOTE — Assessment & Plan Note (Signed)
>  25 minutes spent in face to face time with patient, >50% spent in counselling or coordination of care Agrees to bariatric surgery referral. Referral placed.

## 2015-08-02 NOTE — Progress Notes (Signed)
Pre visit review using our clinic review tool, if applicable. No additional management support is needed unless otherwise documented below in the visit note. 

## 2015-08-02 NOTE — Patient Instructions (Signed)
Great to see you. We are referring you to a bariatric surgeon.

## 2015-08-09 ENCOUNTER — Telehealth: Payer: Self-pay

## 2015-08-09 DIAGNOSIS — Z803 Family history of malignant neoplasm of breast: Secondary | ICD-10-CM | POA: Diagnosis not present

## 2015-08-09 DIAGNOSIS — Z1231 Encounter for screening mammogram for malignant neoplasm of breast: Secondary | ICD-10-CM | POA: Diagnosis not present

## 2015-08-09 NOTE — Telephone Encounter (Signed)
Pt left v/m; pt last seen 08/02/15; pt has DDD of spine and OA and RA of both knees. pts daughter wants pt to have rollator to assist pt in walking. Pt request cb when order ready; pt is aware Dr Deborra Medina out of office.

## 2015-08-10 ENCOUNTER — Encounter: Payer: Self-pay | Admitting: Family Medicine

## 2015-08-11 NOTE — Telephone Encounter (Signed)
Spoke to pt and informed her Rx orders are available for pickup from the front desk

## 2015-08-11 NOTE — Telephone Encounter (Signed)
Rx written and on my desk.

## 2015-08-16 ENCOUNTER — Other Ambulatory Visit: Payer: Self-pay | Admitting: Family Medicine

## 2015-08-16 NOTE — Telephone Encounter (Signed)
She has not had an OV for this particular concern.

## 2015-08-16 NOTE — Telephone Encounter (Addendum)
Tina Pacheco left a message on voicemail stating that they needs office notes to support the need for the Rolator. Tina Pacheco requested that office notes be faxed to [phone].

## 2015-08-16 NOTE — Telephone Encounter (Signed)
Spoke to [place] and advised. States she will contact pt and informed them. Pt will contact office if wanting to be seen to receive supplies

## 2015-08-17 ENCOUNTER — Telehealth: Payer: Self-pay

## 2015-08-17 NOTE — Telephone Encounter (Signed)
Pt left v/m to ck on status of oxycodone PA; spoke with Olegario Shearer at [facility] PA already faxed to Dr Deborra Medina. Pt has appt on 08/18/15.

## 2015-08-17 NOTE — Telephone Encounter (Signed)
Pt requesting mediation refill. pls advise

## 2015-08-18 ENCOUNTER — Telehealth: Payer: Self-pay | Admitting: Family Medicine

## 2015-08-18 ENCOUNTER — Encounter: Payer: Self-pay | Admitting: Family Medicine

## 2015-08-18 ENCOUNTER — Ambulatory Visit (INDEPENDENT_AMBULATORY_CARE_PROVIDER_SITE_OTHER): Payer: Medicare Other | Admitting: Family Medicine

## 2015-08-18 VITALS — BP 152/84 | HR 66 | Temp 97.5°F | Wt 321.5 lb

## 2015-08-18 DIAGNOSIS — M25561 Pain in right knee: Secondary | ICD-10-CM

## 2015-08-18 DIAGNOSIS — M1711 Unilateral primary osteoarthritis, right knee: Secondary | ICD-10-CM

## 2015-08-18 MED ORDER — OXYCODONE HCL 5 MG PO TABS: ORAL_TABLET | ORAL | Status: DC

## 2015-08-18 NOTE — Progress Notes (Signed)
Pre visit review using our clinic review tool, if applicable. No additional management support is needed unless otherwise documented below in the visit note. 

## 2015-08-18 NOTE — Telephone Encounter (Signed)
Pt dropped off letter and returned prescription. I put envelope on cart.

## 2015-08-18 NOTE — Assessment & Plan Note (Signed)
Has been to our office to see me and ortho multiple times for knee, leg back pain with known significant OA and RA. She would very much benefit from a rollator for continued mobility and stability.

## 2015-08-18 NOTE — Telephone Encounter (Signed)
We have not received notification PA is required. Must await notification before completion of PA

## 2015-08-18 NOTE — Progress Notes (Signed)
Subjective:   Patient ID: Tina Pacheco, female    DOB: April 13, 1950, 65 y.o.   MRN: [id number]  Tina Pacheco is a pleasant 65 y.o. year old female with complicated medical history, well known to me, who presents to clinic today with Knee Pain  on 08/18/2015  HPI:   Chronic pain- known DDD of spine, OA of both knees and RA. Followed by rheum and ortho.  Saw ortho, was told she has no cartilage in her knee but could not be a candidate for knee surgery until she lost 100 pounds.  Morbid obesity-we have discussed that her weight is a contributing factor in her chronic pain.  She has tried weight watchers in the past.  She also looked into nutri system but it is cost prohibitive. Referred her to bariatric surgery earler this month.  She is requesting rollator for mobility.   Current Outpatient Prescriptions on File Prior to Visit  Medication Sig Dispense Refill  . amLODipine (NORVASC) 5 MG tablet Take 1 tablet (5 mg total) by mouth daily. (Patient taking differently: Take 5 mg by mouth every morning. ) 90 tablet 3  . atorvastatin (LIPITOR) 20 MG tablet Take 1 tablet (20 mg total) by mouth daily. (Patient taking differently: Take 20 mg by mouth at bedtime. ) 90 tablet 3  . chlorpheniramine (CHLOR-TRIMETON) 4 MG tablet Take 4 mg by mouth every 4 (four) hours as needed (for drippy nose, drainage, and throat clearing).    . dabigatran (PRADAXA) 150 MG CAPS capsule Take 1 capsule (150 mg total) by mouth 2 (two) times daily. 180 capsule 3  . dextromethorphan (DELSYM) 30 MG/5ML liquid Take 2 tsp twice daily as needed for cough    . famotidine (PEPCID) 20 MG tablet One at bedtime (Patient taking differently: Take 20 mg by mouth at bedtime. ) 30 tablet 11  . Febuxostat (ULORIC PO) Take 40 mg by mouth every morning.     . fluticasone (FLONASE) 50 MCG/ACT nasal spray Place 2 sprays into both nostrils daily as needed (nasal stuffiness).    . gabapentin (NEURONTIN) 300 MG capsule take 1 capsule by  mouth three times a day (TAKE 1 CAPSULE 2 TO 3 HOURS PRIOR TO BEDTIME AS DIRECTED) 90 capsule 3  . hydroxychloroquine (PLAQUENIL) 200 MG tablet Take by mouth 2 (two) times daily.    Marland Kitchen KLOR-CON M20 20 MEQ tablet TAKE 1/2 TABLET (=10MEQ)   TWICE A DAY; MAY TAKE AN   EXTRA TABLET WHEN TAKING   TORSEMIDE (Patient taking differently: TAKE 1/2 TABLET (=10MEQ)   TWICE A DAY) 180 tablet 3  . leflunomide (ARAVA) 20 MG tablet Take 20 mg by mouth every morning.     . metoprolol succinate (TOPROL-XL) 50 MG 24 hr tablet take 1 tablet by mouth once daily 30 tablet 3  . montelukast (SINGULAIR) 10 MG tablet take 1 tablet by mouth at bedtime 30 tablet 3  . Multiple Vitamin (MULTIVITAMIN) tablet Take 1 tablet by mouth every morning.     Marland Kitchen oxyCODONE (ROXICODONE) 5 MG immediate release tablet Take 2 tablets by mouth every 12 hours 270 tablet 0  . pantoprazole (PROTONIX) 40 MG tablet Take 40 mg by mouth daily before breakfast.     . pregabalin (LYRICA) 150 MG capsule Take 150 mg by mouth 3 (three) times daily.    Marland Kitchen Respiratory Therapy Supplies (FLUTTER) DEVI Use as directed 1 each 0  . terazosin (HYTRIN) 10 MG capsule take 1 capsule by mouth at bedtime 90 capsule  2  . torsemide (DEMADEX) 20 MG tablet Take 1 tablet (20 mg total) by mouth 2 (two) times daily. Edema (Patient taking differently: Take 20 mg by mouth 2 (two) times daily as needed. ) 60 tablet 3  . traMADol (ULTRAM) 50 MG tablet Take 50 mg by mouth every 4 (four) hours as needed.    . mometasone-formoterol (DULERA) 100-5 MCG/ACT AERO Inhale 2 puffs into the lungs 2 (two) times daily. 1 Inhaler 0   No current facility-administered medications on file prior to visit.    No Known Allergies  Past Medical History  Diagnosis Date  . Hypertension   . Dyslipidemia   . PAF (paroxysmal atrial fibrillation) (HCC)     a. on Pradaxa; b. CHADSVASc at least 2 (HTN & female)  . Diastolic dysfunction   . Morbid obesity ([place])   . Hypokalemia   . History of cardiac  cath     a. 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 Asthma   . Rheumatoid arthritis(714.0)   . Basal cell carcinoma   . Hyperplastic colonic polyp 2003  . History of gout   . COPD (chronic obstructive pulmonary disease) ([place])   . Diastolic dysfunction     a. echo 07/2014: EF 55-60%, no RWMA, GR2DD, mild MR, LA moderately dilated, PASP 38 mm Hg  . Sleep apnea     a. not compliant with CPAP    Past Surgical History  Procedure Laterality Date  . Cardiac catheterization  05/24/2010    nonobstructive CAD  . Knee arthroscopy      bilateral  . Vaginal hysterectomy    . Cholecystectomy    . Cesarean section    . Colonoscopy  06/12/2011    Procedure: COLONOSCOPY;  Surgeon: Juanita Craver, MD;  Location: [facility];  Service: Endoscopy;  Laterality: N/A;  . Colonoscopy N/A 03/17/2013    Procedure: COLONOSCOPY;  Surgeon: Juanita Craver, MD;  Location: [facility];  Service: Endoscopy;  Laterality: N/A;    Family History  Problem Relation Age of Onset  . Tuberculosis Mother   . Parkinsonism Mother   . Diabetes type II Sister   . Breast cancer Sister   . Breast cancer Maternal Aunt   . Emphysema Father     smoked  . Tuberculosis Sister     Social History   Social History  . Marital Status: Married    Spouse Name: N/A  . Number of Children: N/A  . Years of Education: N/A   Occupational History  . Retired    Social History Main Topics  . Smoking status: Never Smoker   . Smokeless tobacco: Never Used  . Alcohol Use: No  . Drug Use: No  . Sexual Activity: Not on file   Other Topics Concern  . Not on file   Social History Narrative   The PMH, PSH, Social History, Family History, Medications, and allergies have been reviewed in [facility], and have been updated if relevant.  Review of Systems  Respiratory: Negative for cough and shortness of breath.   Musculoskeletal: Positive for back pain and joint swelling.       Objective:    BP 152/84 mmHg  Pulse 66  Temp(Src)  97.5 F (36.4 C) (Oral)  Wt 321 lb 8 oz (145.831 kg)  SpO2 97% Wt Readings from Last 3 Encounters:  08/18/15 321 lb 8 oz (145.831 kg)  08/02/15 311 lb 8 oz (141.295 kg)  07/13/15 343 lb 12.8 oz (155.947 kg)     Physical  Exam  Constitutional: She is oriented to person, place, and time. She appears well-developed and well-nourished. No distress.  Morbidly obese  HENT:  Head: Normocephalic and atraumatic.  Eyes: Conjunctivae are normal.  Cardiovascular: Normal rate.   Pulmonary/Chest: Effort normal.  Neurological: She is alert and oriented to person, place, and time.  Skin: Skin is warm and dry.  Psychiatric: She has a normal mood and affect. Her behavior is normal. Judgment and thought content normal.  Nursing note and vitals reviewed.         Assessment & Plan:   Right knee pain  Primary osteoarthritis of right knee  Morbid obesity, unspecified obesity type ([place]) No Follow-up on file.

## 2015-08-19 ENCOUNTER — Encounter: Payer: Self-pay | Admitting: Family Medicine

## 2015-09-13 ENCOUNTER — Ambulatory Visit (INDEPENDENT_AMBULATORY_CARE_PROVIDER_SITE_OTHER): Payer: Medicare Other | Admitting: Family Medicine

## 2015-09-13 ENCOUNTER — Encounter: Payer: Self-pay | Admitting: Family Medicine

## 2015-09-13 VITALS — BP 138/62 | HR 67 | Temp 97.8°F | Wt 322.8 lb

## 2015-09-13 DIAGNOSIS — M5137 Other intervertebral disc degeneration, lumbosacral region: Secondary | ICD-10-CM | POA: Diagnosis not present

## 2015-09-13 MED ORDER — OXYCODONE HCL 5 MG PO TABS: ORAL_TABLET | ORAL | Status: DC

## 2015-09-13 NOTE — Assessment & Plan Note (Signed)
Narcotic rx refilled and given to pt. Will look into bariatric MDs and get in touch with pt. The patient indicates understanding of these issues and agrees with the plan.

## 2015-09-13 NOTE — Progress Notes (Signed)
Pre visit review using our clinic review tool, if applicable. No additional management support is needed unless otherwise documented below in the visit note. 

## 2015-09-13 NOTE — Progress Notes (Signed)
Subjective:   Patient ID: Tina Pacheco, female    DOB: [birth year], 65 y.o.   MRN: [id number]  Tina Pacheco is a pleasant 65 y.o. year old female with complicated medical history, well known to me, who presents to clinic today with Back Pain and Knee Pain  on 09/13/2015  HPI:   Chronic pain- known DDD of spine, OA of both knees and RA. Followed by rheum and ortho.  Saw ortho, was told she has no cartilage in her knee but could not be a candidate for knee surgery until she lost 100 pounds.  Morbid obesity-we have discussed that her weight is a contributing factor in her chronic pain.  She has tried weight watchers in the past.  She also looked into nutri system but it is cost prohibitive. Referred her to bariatric surgery but she is refusing this step "for now."  Asking for referral to bariatric MD who is not a surgeon   Current Outpatient Prescriptions on File Prior to Visit  Medication Sig Dispense Refill  . amLODipine (NORVASC) 5 MG tablet Take 1 tablet (5 mg total) by mouth daily. (Patient taking differently: Take 5 mg by mouth every morning. ) 90 tablet 3  . atorvastatin (LIPITOR) 20 MG tablet Take 1 tablet (20 mg total) by mouth daily. (Patient taking differently: Take 20 mg by mouth at bedtime. ) 90 tablet 3  . chlorpheniramine (CHLOR-TRIMETON) 4 MG tablet Take 4 mg by mouth every 4 (four) hours as needed (for drippy nose, drainage, and throat clearing).    . dabigatran (PRADAXA) 150 MG CAPS capsule Take 1 capsule (150 mg total) by mouth 2 (two) times daily. 180 capsule 3  . dextromethorphan (DELSYM) 30 MG/5ML liquid Take 2 tsp twice daily as needed for cough    . famotidine (PEPCID) 20 MG tablet One at bedtime (Patient taking differently: Take 20 mg by mouth at bedtime. ) 30 tablet 11  . Febuxostat (ULORIC PO) Take 40 mg by mouth every morning.     . fluticasone (FLONASE) 50 MCG/ACT nasal spray Place 2 sprays into both nostrils daily as needed (nasal stuffiness).    .  gabapentin (NEURONTIN) 300 MG capsule take 1 capsule by mouth three times a day (TAKE 1 CAPSULE 2 TO 3 HOURS PRIOR TO BEDTIME AS DIRECTED) 90 capsule 3  . hydroxychloroquine (PLAQUENIL) 200 MG tablet Take by mouth 2 (two) times daily.    Marland Kitchen KLOR-CON M20 20 MEQ tablet TAKE 1/2 TABLET (=10MEQ)   TWICE A DAY; MAY TAKE AN   EXTRA TABLET WHEN TAKING   TORSEMIDE (Patient taking differently: TAKE 1/2 TABLET (=10MEQ)   TWICE A DAY) 180 tablet 3  . leflunomide (ARAVA) 20 MG tablet Take 20 mg by mouth every morning.     . metoprolol succinate (TOPROL-XL) 50 MG 24 hr tablet take 1 tablet by mouth once daily 30 tablet 3  . montelukast (SINGULAIR) 10 MG tablet take 1 tablet by mouth at bedtime 30 tablet 3  . Multiple Vitamin (MULTIVITAMIN) tablet Take 1 tablet by mouth every morning.     Marland Kitchen oxyCODONE (ROXICODONE) 5 MG immediate release tablet Take 2 tablets by mouth every 12 hours 90 tablet 0  . pantoprazole (PROTONIX) 40 MG tablet Take 40 mg by mouth daily before breakfast.     . pregabalin (LYRICA) 150 MG capsule Take 150 mg by mouth 3 (three) times daily.    Marland Kitchen Respiratory Therapy Supplies (FLUTTER) DEVI Use as directed 1 each 0  . terazosin (  HYTRIN) 10 MG capsule take 1 capsule by mouth at bedtime 90 capsule 2  . torsemide (DEMADEX) 20 MG tablet Take 1 tablet (20 mg total) by mouth 2 (two) times daily. Edema (Patient taking differently: Take 20 mg by mouth 2 (two) times daily as needed. ) 60 tablet 3  . traMADol (ULTRAM) 50 MG tablet Take 50 mg by mouth every 4 (four) hours as needed.    . mometasone-formoterol (DULERA) 100-5 MCG/ACT AERO Inhale 2 puffs into the lungs 2 (two) times daily. 1 Inhaler 0   No current facility-administered medications on file prior to visit.    No Known Allergies  Past Medical History  Diagnosis Date  . Hypertension   . Dyslipidemia   . PAF (paroxysmal atrial fibrillation) (HCC)     a. on Pradaxa; b. CHADSVASc at least 2 (HTN & female)  . Diastolic dysfunction   . Morbid  obesity ([place])   . Hypokalemia   . History of cardiac cath     a. 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 Asthma   . Rheumatoid arthritis(714.0)   . Basal cell carcinoma   . Hyperplastic colonic polyp 2003  . History of gout   . COPD (chronic obstructive pulmonary disease) ([place])   . Diastolic dysfunction     a. echo 07/2014: EF 55-60%, no RWMA, GR2DD, mild MR, LA moderately dilated, PASP 38 mm Hg  . Sleep apnea     a. not compliant with CPAP    Past Surgical History  Procedure Laterality Date  . Cardiac catheterization  05/24/2010    nonobstructive CAD  . Knee arthroscopy      bilateral  . Vaginal hysterectomy    . Cholecystectomy    . Cesarean section    . Colonoscopy  06/12/2011    Procedure: COLONOSCOPY;  Surgeon: Juanita Craver, MD;  Location: [facility];  Service: Endoscopy;  Laterality: N/A;  . Colonoscopy N/A 03/17/2013    Procedure: COLONOSCOPY;  Surgeon: Juanita Craver, MD;  Location: [facility];  Service: Endoscopy;  Laterality: N/A;    Family History  Problem Relation Age of Onset  . Tuberculosis Mother   . Parkinsonism Mother   . Diabetes type II Sister   . Breast cancer Sister   . Breast cancer Maternal Aunt   . Emphysema Father     smoked  . Tuberculosis Sister     Social History   Social History  . Marital Status: Married    Spouse Name: N/A  . Number of Children: N/A  . Years of Education: N/A   Occupational History  . Retired    Social History Main Topics  . Smoking status: Never Smoker   . Smokeless tobacco: Never Used  . Alcohol Use: No  . Drug Use: No  . Sexual Activity: Not on file   Other Topics Concern  . Not on file   Social History Narrative   The PMH, PSH, Social History, Family History, Medications, and allergies have been reviewed in [facility], and have been updated if relevant.  Review of Systems  Respiratory: Negative for cough and shortness of breath.   Musculoskeletal: Positive for back pain and joint swelling.         Objective:    There were no vitals taken for this visit. Wt Readings from Last 3 Encounters:  08/18/15 321 lb 8 oz (145.831 kg)  08/02/15 311 lb 8 oz (141.295 kg)  07/13/15 343 lb 12.8 oz (155.947 kg)     Physical Exam  Constitutional: She is oriented to person, place, and time. She appears well-developed and well-nourished. No distress.  Morbidly obese  HENT:  Head: Normocephalic and atraumatic.  Eyes: Conjunctivae are normal.  Cardiovascular: Normal rate.   Pulmonary/Chest: Effort normal.  Neurological: She is alert and oriented to person, place, and time.  Skin: Skin is warm and dry.  Psychiatric: She has a normal mood and affect. Her behavior is normal. Judgment and thought content normal.  Nursing note and vitals reviewed.         Assessment & Plan:   DDD (degenerative disc disease), lumbosacral No Follow-up on file.

## 2015-09-14 ENCOUNTER — Ambulatory Visit: Payer: Medicare Other | Admitting: Internal Medicine

## 2015-09-20 DIAGNOSIS — M25561 Pain in right knee: Secondary | ICD-10-CM | POA: Diagnosis not present

## 2015-09-20 DIAGNOSIS — R262 Difficulty in walking, not elsewhere classified: Secondary | ICD-10-CM | POA: Diagnosis not present

## 2015-09-20 DIAGNOSIS — M25562 Pain in left knee: Secondary | ICD-10-CM | POA: Diagnosis not present

## 2015-09-20 DIAGNOSIS — M170 Bilateral primary osteoarthritis of knee: Secondary | ICD-10-CM | POA: Diagnosis not present

## 2015-10-07 ENCOUNTER — Telehealth: Payer: Self-pay

## 2015-10-07 NOTE — Telephone Encounter (Signed)
I am not very familiar with [employer] but I looked it up and it should not be an issue, but will wait for the final answer from Dr. Deborra Medina upon her return.

## 2015-10-07 NOTE — Telephone Encounter (Signed)
Pt left v/m; pt has decided to participate in [employer] program; if Dr Deborra Medina has any objections call pt; otherwise pt plans to try the [employer] program; pt thinks this may work for her.

## 2015-10-10 ENCOUNTER — Other Ambulatory Visit: Payer: Self-pay | Admitting: Cardiovascular Disease

## 2015-10-14 ENCOUNTER — Telehealth: Payer: Self-pay | Admitting: Family Medicine

## 2015-10-14 MED ORDER — OXYCODONE HCL 5 MG PO TABS: ORAL_TABLET | ORAL | 0 refills | Status: DC

## 2015-10-14 NOTE — Telephone Encounter (Signed)
Pt called to request a refill of her Oxycodone.  She is requesting a 120 pills which is her 30 day supply at 4 pills/day.  She uses [facility]. Thanks!

## 2015-10-14 NOTE — Telephone Encounter (Signed)
Ok to print out and put on my desk for signature. 

## 2015-10-18 ENCOUNTER — Encounter: Payer: Self-pay | Admitting: Family Medicine

## 2015-10-18 DIAGNOSIS — Z79891 Long term (current) use of opiate analgesic: Secondary | ICD-10-CM | POA: Diagnosis not present

## 2015-10-18 NOTE — Telephone Encounter (Signed)
Patient called.  Please call patient when prescription is ready for pick up at [phone].

## 2015-10-18 NOTE — Telephone Encounter (Signed)
Spoke to pt and informed her Rx is available for pickup from the front desk. Pt advised third party unable to pickup 

## 2015-10-20 DIAGNOSIS — M25561 Pain in right knee: Secondary | ICD-10-CM | POA: Diagnosis not present

## 2015-10-20 DIAGNOSIS — M170 Bilateral primary osteoarthritis of knee: Secondary | ICD-10-CM | POA: Diagnosis not present

## 2015-10-20 DIAGNOSIS — M25562 Pain in left knee: Secondary | ICD-10-CM | POA: Diagnosis not present

## 2015-10-20 DIAGNOSIS — R262 Difficulty in walking, not elsewhere classified: Secondary | ICD-10-CM | POA: Diagnosis not present

## 2015-10-25 DIAGNOSIS — M1A09X0 Idiopathic chronic gout, multiple sites, without tophus (tophi): Secondary | ICD-10-CM | POA: Diagnosis not present

## 2015-10-25 DIAGNOSIS — M797 Fibromyalgia: Secondary | ICD-10-CM | POA: Diagnosis not present

## 2015-10-25 DIAGNOSIS — M0589 Other rheumatoid arthritis with rheumatoid factor of multiple sites: Secondary | ICD-10-CM | POA: Diagnosis not present

## 2015-10-25 DIAGNOSIS — Z79899 Other long term (current) drug therapy: Secondary | ICD-10-CM | POA: Diagnosis not present

## 2015-10-25 DIAGNOSIS — M150 Primary generalized (osteo)arthritis: Secondary | ICD-10-CM | POA: Diagnosis not present

## 2015-10-26 DIAGNOSIS — M25562 Pain in left knee: Secondary | ICD-10-CM | POA: Diagnosis not present

## 2015-10-26 DIAGNOSIS — M1712 Unilateral primary osteoarthritis, left knee: Secondary | ICD-10-CM | POA: Diagnosis not present

## 2015-10-28 ENCOUNTER — Other Ambulatory Visit: Payer: Self-pay | Admitting: Family Medicine

## 2015-10-28 DIAGNOSIS — M25561 Pain in right knee: Secondary | ICD-10-CM | POA: Diagnosis not present

## 2015-10-28 DIAGNOSIS — M1711 Unilateral primary osteoarthritis, right knee: Secondary | ICD-10-CM | POA: Diagnosis not present

## 2015-11-03 DIAGNOSIS — M25562 Pain in left knee: Secondary | ICD-10-CM | POA: Diagnosis not present

## 2015-11-03 DIAGNOSIS — M1712 Unilateral primary osteoarthritis, left knee: Secondary | ICD-10-CM | POA: Diagnosis not present

## 2015-11-04 ENCOUNTER — Other Ambulatory Visit: Payer: Self-pay | Admitting: Family Medicine

## 2015-11-04 DIAGNOSIS — M25561 Pain in right knee: Secondary | ICD-10-CM | POA: Diagnosis not present

## 2015-11-04 DIAGNOSIS — M1711 Unilateral primary osteoarthritis, right knee: Secondary | ICD-10-CM | POA: Diagnosis not present

## 2015-11-08 ENCOUNTER — Ambulatory Visit (INDEPENDENT_AMBULATORY_CARE_PROVIDER_SITE_OTHER): Payer: Medicare Other | Admitting: Cardiovascular Disease

## 2015-11-08 ENCOUNTER — Encounter: Payer: Self-pay | Admitting: Cardiovascular Disease

## 2015-11-08 VITALS — BP 134/80 | HR 71 | Ht 64.0 in | Wt 323.0 lb

## 2015-11-08 DIAGNOSIS — I480 Paroxysmal atrial fibrillation: Secondary | ICD-10-CM

## 2015-11-08 DIAGNOSIS — E785 Hyperlipidemia, unspecified: Secondary | ICD-10-CM

## 2015-11-08 DIAGNOSIS — I10 Essential (primary) hypertension: Secondary | ICD-10-CM

## 2015-11-08 DIAGNOSIS — R600 Localized edema: Secondary | ICD-10-CM

## 2015-11-08 DIAGNOSIS — I2510 Atherosclerotic heart disease of native coronary artery without angina pectoris: Secondary | ICD-10-CM | POA: Diagnosis not present

## 2015-11-08 NOTE — Progress Notes (Signed)
Cardiology Office Note  Date:  11/08/2015   ID:  Tina, Pacheco Sep 13, 1950, MRN [id number]  PCP:  Arnette Norris, MD   Chief Complaint  Patient presents with  . Other    1 yr f/uc/o fluid retention and sob. Meds reviewed verbally with pt.    HPI:  [name] is a 65 year old woman with morbid obesity, who was admitted to [facility] on May 23 2010 for paroxysmal atrial fibrillation, echocardiogram showing normal LV function with diastolic dysfunction, also with hyperlipidemia, hypertension, cardiac catheterization May 24 2010 showing mild nonobstructive coronary artery disease, history of asthma/COPD, suspected sleep apnea, rheumatoid arthritis who presents for routine followup of her atrial fibrillation.  episodes of atrial fibrillation were March and July 2012, October 2013. episode of atrial fibrillation 12/16/2011. She was started on diltiazem infusion and converted to normal sinus rhythm several hours later.   In follow-up today, she reports that she feels "poofy" Feels that she is retaining fluid Unable to tolerate torsemide on a regular basis, takes this sparingly Bothered by polyuria Difficulty walking to the bathroom secondary to chronic knee pain Weight up and down 340 to 307, is typically her range ideal weight is 310 per patient Can't exercise,  Trying to watch diet, having trouble losing weight  Previous Echo in 2016 with EF [id number], diastolic dysfunction, results reviewed with her  In 2016, no PAD in legs, results reviewed with her  EKG on today's visit shows normal sinus rhythm with rate 71 bpm, no significant ST or T-wave changes  She denies any significant atrial fibrillation.   Previously, Lisinopril was changed to Norvasc for cough.   EKG on today's visit shows normal sinus rhythm with rate 67 bpm, no significant ST or T-wave changes  Other past medical history  fibromyalgia and rheumatoid arthritis  Family previously indicated that they  thought that she has obstructive sleep apnea. She does not think that she can sleep with a CPAP. All of her atrial fibrillation episodes have occurred first thing in the morning prior to medications.   lab work showing total cholesterol 114, LDL 55  PMH:   has a past medical history of Asthma; Basal cell carcinoma; COPD (chronic obstructive pulmonary disease) ([place]); Diastolic dysfunction; Diastolic dysfunction; Dyslipidemia; History of cardiac cath; History of gout; Hyperplastic colonic polyp (2003); Hypertension; Hypokalemia; Morbid obesity ([place]); PAF (paroxysmal atrial fibrillation) ([place]); Rheumatoid arthritis(714.0); and Sleep apnea.  PSH:    Past Surgical History:  Procedure Laterality Date  . CARDIAC CATHETERIZATION  05/24/2010   nonobstructive CAD  . CESAREAN SECTION    . CHOLECYSTECTOMY    . COLONOSCOPY  06/12/2011   Procedure: COLONOSCOPY;  Surgeon: Juanita Craver, MD;  Location: [facility];  Service: Endoscopy;  Laterality: N/A;  . COLONOSCOPY N/A 03/17/2013   Procedure: COLONOSCOPY;  Surgeon: Juanita Craver, MD;  Location: [facility];  Service: Endoscopy;  Laterality: N/A;  . KNEE ARTHROSCOPY     bilateral  . VAGINAL HYSTERECTOMY      Current Outpatient Prescriptions  Medication Sig Dispense Refill  . amLODipine (NORVASC) 5 MG tablet Take 1 tablet (5 mg total) by mouth daily. COMPLETE PHYSICAL EXAM REQUIRED FOR ADDITIONAL REFILLS 30 tablet 0  . atorvastatin (LIPITOR) 20 MG tablet Take 1 tablet (20 mg total) by mouth daily. (Patient taking differently: Take 20 mg by mouth at bedtime. ) 90 tablet 3  . chlorpheniramine (CHLOR-TRIMETON) 4 MG tablet Take 4 mg by mouth every 4 (four) hours as needed (for drippy nose, drainage, and throat clearing).    Marland Kitchen  dextromethorphan (DELSYM) 30 MG/5ML liquid Take 2 tsp twice daily as needed for cough    . famotidine (PEPCID) 20 MG tablet One at bedtime (Patient taking differently: Take 20 mg by mouth at bedtime. ) 30 tablet 11  . Febuxostat (ULORIC  PO) Take 40 mg by mouth every morning.     . fluticasone (FLONASE) 50 MCG/ACT nasal spray Place 2 sprays into both nostrils daily as needed (nasal stuffiness).    . gabapentin (NEURONTIN) 300 MG capsule take 1 capsule by mouth three times a day (TAKE 1 CAPSULE 2 TO 3 HOURS PRIOR TO BEDTIME AS DIRECTED) 90 capsule 3  . hydroxychloroquine (PLAQUENIL) 200 MG tablet Take by mouth 2 (two) times daily.    Marland Kitchen KLOR-CON M20 20 MEQ tablet TAKE 1/2 TABLET (=10MEQ)   TWICE A DAY; MAY TAKE AN   EXTRA TABLET WHEN TAKING   TORSEMIDE (Patient taking differently: TAKE 1/2 TABLET (=10MEQ)   TWICE A DAY) 180 tablet 3  . leflunomide (ARAVA) 20 MG tablet Take 20 mg by mouth every morning.     . metoprolol succinate (TOPROL-XL) 50 MG 24 hr tablet take 1 tablet by mouth once daily 30 tablet 3  . montelukast (SINGULAIR) 10 MG tablet take 1 tablet by mouth at bedtime 30 tablet 3  . Multiple Vitamin (MULTIVITAMIN) tablet Take 1 tablet by mouth every morning.     Marland Kitchen oxyCODONE (ROXICODONE) 5 MG immediate release tablet Take 2 tablets by mouth every 12 hours 120 tablet 0  . pantoprazole (PROTONIX) 40 MG tablet Take 40 mg by mouth daily before breakfast.     . PRADAXA 150 MG CAPS capsule TAKE 1 CAPSULE TWICE DAILY 180 capsule 0  . pregabalin (LYRICA) 150 MG capsule Take 150 mg by mouth 3 (three) times daily.    Marland Kitchen Respiratory Therapy Supplies (FLUTTER) DEVI Use as directed 1 each 0  . terazosin (HYTRIN) 10 MG capsule take 1 capsule by mouth at bedtime 90 capsule 2  . torsemide (DEMADEX) 20 MG tablet Take 1 tablet (20 mg total) by mouth 2 (two) times daily. Edema (Patient taking differently: Take 20 mg by mouth 2 (two) times daily as needed. ) 60 tablet 3  . traMADol (ULTRAM) 50 MG tablet Take 50 mg by mouth every 4 (four) hours as needed.    . mometasone-formoterol (DULERA) 100-5 MCG/ACT AERO Inhale 2 puffs into the lungs 2 (two) times daily. 1 Inhaler 0   No current facility-administered medications for this visit.       Allergies:   Review of patient's allergies indicates no known allergies.   Social History:  The patient  reports that she has never smoked. She has never used smokeless tobacco. She reports that she does not drink alcohol or use drugs.   Family History:   family history includes Breast cancer in her maternal aunt and sister; Diabetes type II in her sister; Emphysema in her father; Parkinsonism in her mother; Tuberculosis in her mother and sister.    Review of Systems: Review of Systems  Constitutional: Negative.   Respiratory: Negative.   Cardiovascular: Negative.   Gastrointestinal: Negative.   Musculoskeletal: Negative.   Neurological: Negative.   Psychiatric/Behavioral: Negative.   All other systems reviewed and are negative.    PHYSICAL EXAM: VS:  BP 134/80 (BP Location: Left Wrist, Patient Position: Sitting, Cuff Size: Large)   Pulse 71   Ht 5\' 4"  (1.626 m)   Wt (!) 323 lb (146.5 kg)   BMI 55.44 kg/m  ,  BMI Body mass index is 55.44 kg/m. GEN: Well nourished, well developed, in no acute distress, obese  HEENT: normal  Neck: no JVD, carotid bruits, or masses Cardiac: RRR; no murmurs, rubs, or gallops,no edema  Respiratory:  clear to auscultation bilaterally, normal work of breathing GI: soft, nontender, nondistended, + BS MS: no deformity or atrophy  Skin: warm and dry, no rash Neuro:  Strength and sensation are intact Psych: euthymic mood, full affect    Recent Labs: 12/22/2014: BUN 11; Creatinine, Ser 0.74; Hemoglobin 11.7; Platelets 280.0; Potassium 4.2; Pro B Natriuretic peptide (BNP) 335.0; Sodium 140    Lipid Panel Lab Results  Component Value Date   CHOL 114 09/16/2013   HDL 43.50 09/16/2013   LDLCALC 55 09/16/2013   TRIG 78.0 09/16/2013      Wt Readings from Last 3 Encounters:  11/08/15 (!) 323 lb (146.5 kg)  09/13/15 (!) 322 lb 12 oz (146.4 kg)  08/18/15 (!) 321 lb 8 oz (145.8 kg)       ASSESSMENT AND PLAN:  PAF (paroxysmal atrial  fibrillation) (HCC) - Plan: EKG 12-Lead Maintaining normal sinus rhythm. No symptoms concerning for atrial fibrillation Tolerating anticoagulation  Essential hypertension - Plan: EKG 12-Lead Blood pressure is well controlled on today's visit. No changes made to the medications.  Hyperlipidemia Cholesterol is at goal on the current lipid regimen. No changes to the medications were made.  Coronary artery disease, non-occlusive Currently with no symptoms of angina. No further workup at this time. Continue current medication regimen.  Morbid obesity, unspecified obesity type ([place]) Limited in her ability to exercise secondary to arthritis. Tried better  Bilateral leg edema Troubled by her leg edema, does not want to take torsemide on a regular basis Labile weight secondary to fluid retention and diastolic CHF  Chronic diastolic CHF Recommended she try to be more compliant with her torsemide when weight trends upward Minimize fluid intake, salt intake Long discussion   Total encounter time more than 25 minutes  Greater than 50% was spent in counseling and coordination of care with the patient   Disposition:   F/U  6 months   Orders Placed This Encounter  Procedures  . EKG 12-Lead     Signed, Esmond Plants, M.D., Ph.D. 11/08/2015  [place], [place]

## 2015-11-08 NOTE — Patient Instructions (Addendum)
Medication Instructions:   No medication changes made  Labwork:  No new labs needed  We will request labs from [facility]  Testing/Procedures:  No further testing at this time   Follow-Up: It was a pleasure seeing you in the office today. Please call us if you have new issues that need to be addressed before your next appt.  [phone]  Your physician wants you to follow-up in: 12 months.  You will receive a reminder letter in the mail two months in advance. If you don't receive a letter, please call our office to schedule the follow-up appointment.  If you need a refill on your cardiac medications before your next appointment, please call your pharmacy.

## 2015-11-09 DIAGNOSIS — M1712 Unilateral primary osteoarthritis, left knee: Secondary | ICD-10-CM | POA: Diagnosis not present

## 2015-11-09 DIAGNOSIS — M25562 Pain in left knee: Secondary | ICD-10-CM | POA: Diagnosis not present

## 2015-11-11 DIAGNOSIS — M1711 Unilateral primary osteoarthritis, right knee: Secondary | ICD-10-CM | POA: Diagnosis not present

## 2015-11-11 DIAGNOSIS — M25561 Pain in right knee: Secondary | ICD-10-CM | POA: Diagnosis not present

## 2015-11-16 ENCOUNTER — Other Ambulatory Visit: Payer: Self-pay

## 2015-11-16 MED ORDER — OXYCODONE HCL 5 MG PO TABS: ORAL_TABLET | ORAL | 0 refills | Status: DC

## 2015-11-16 NOTE — Telephone Encounter (Signed)
Is she still to receive this medication after she failed her UDS? She has gotten this medication every month since 05/2015

## 2015-11-16 NOTE — Telephone Encounter (Signed)
Pt left v/m requesting rx oxycodone. Call when  Ready for pick up. Last printed # 120 on 10/14/15; last seen 09/13/15.

## 2015-11-17 NOTE — Telephone Encounter (Signed)
Per pt, she ran out early due to increased pain and had not had any in her system when she took the UDS.  She is aware that if she fails again, I can no longer prescribe rx.

## 2015-11-17 NOTE — Telephone Encounter (Signed)
Spoke to pt and informed her Rx is available for pickup from the front desk 

## 2015-11-18 ENCOUNTER — Encounter: Payer: Self-pay | Admitting: Family Medicine

## 2015-11-18 ENCOUNTER — Other Ambulatory Visit: Payer: Self-pay | Admitting: Cardiovascular Disease

## 2015-11-18 DIAGNOSIS — Z79891 Long term (current) use of opiate analgesic: Secondary | ICD-10-CM | POA: Diagnosis not present

## 2015-11-22 DIAGNOSIS — M25562 Pain in left knee: Secondary | ICD-10-CM | POA: Diagnosis not present

## 2015-11-22 DIAGNOSIS — M170 Bilateral primary osteoarthritis of knee: Secondary | ICD-10-CM | POA: Diagnosis not present

## 2015-11-22 DIAGNOSIS — M25561 Pain in right knee: Secondary | ICD-10-CM | POA: Diagnosis not present

## 2015-11-28 ENCOUNTER — Other Ambulatory Visit: Payer: Self-pay | Admitting: Family Medicine

## 2015-12-02 ENCOUNTER — Other Ambulatory Visit: Payer: Self-pay | Admitting: Cardiovascular Disease

## 2015-12-02 ENCOUNTER — Other Ambulatory Visit: Payer: Self-pay | Admitting: Family Medicine

## 2015-12-03 DIAGNOSIS — H16223 Keratoconjunctivitis sicca, not specified as Sjogren's, bilateral: Secondary | ICD-10-CM | POA: Diagnosis not present

## 2015-12-03 DIAGNOSIS — H04123 Dry eye syndrome of bilateral lacrimal glands: Secondary | ICD-10-CM | POA: Diagnosis not present

## 2015-12-03 DIAGNOSIS — H25043 Posterior subcapsular polar age-related cataract, bilateral: Secondary | ICD-10-CM | POA: Diagnosis not present

## 2015-12-09 ENCOUNTER — Telehealth: Payer: Self-pay | Admitting: Family Medicine

## 2015-12-09 NOTE — Telephone Encounter (Signed)
Pt called because she said her RX that was sent to [facility] mail order was denied.  She wants to know why - this was for her atorvastatin.  Can you please call her

## 2015-12-10 NOTE — Telephone Encounter (Signed)
Spoke to pt and advised Rx was denied because she has not had lipid labs since 2015. Pt agreed and states she will view her schedule and contact office back to schedule f/u appt

## 2015-12-14 ENCOUNTER — Ambulatory Visit (INDEPENDENT_AMBULATORY_CARE_PROVIDER_SITE_OTHER): Payer: Medicare Other | Admitting: Family Medicine

## 2015-12-14 ENCOUNTER — Encounter: Payer: Self-pay | Admitting: Family Medicine

## 2015-12-14 VITALS — BP 136/72 | HR 71 | Temp 97.7°F | Ht 63.75 in | Wt 326.0 lb

## 2015-12-14 DIAGNOSIS — M1711 Unilateral primary osteoarthritis, right knee: Secondary | ICD-10-CM

## 2015-12-14 DIAGNOSIS — Z1159 Encounter for screening for other viral diseases: Secondary | ICD-10-CM

## 2015-12-14 DIAGNOSIS — G4730 Sleep apnea, unspecified: Secondary | ICD-10-CM

## 2015-12-14 DIAGNOSIS — Z23 Encounter for immunization: Secondary | ICD-10-CM

## 2015-12-14 DIAGNOSIS — Z0000 Encounter for general adult medical examination without abnormal findings: Secondary | ICD-10-CM | POA: Insufficient documentation

## 2015-12-14 DIAGNOSIS — Z7901 Long term (current) use of anticoagulants: Secondary | ICD-10-CM

## 2015-12-14 DIAGNOSIS — E785 Hyperlipidemia, unspecified: Secondary | ICD-10-CM

## 2015-12-14 DIAGNOSIS — I480 Paroxysmal atrial fibrillation: Secondary | ICD-10-CM | POA: Diagnosis not present

## 2015-12-14 DIAGNOSIS — M5137 Other intervertebral disc degeneration, lumbosacral region: Secondary | ICD-10-CM | POA: Diagnosis not present

## 2015-12-14 DIAGNOSIS — I2510 Atherosclerotic heart disease of native coronary artery without angina pectoris: Secondary | ICD-10-CM

## 2015-12-14 DIAGNOSIS — M51379 Other intervertebral disc degeneration, lumbosacral region without mention of lumbar back pain or lower extremity pain: Secondary | ICD-10-CM

## 2015-12-14 DIAGNOSIS — I10 Essential (primary) hypertension: Secondary | ICD-10-CM

## 2015-12-14 LAB — CBC WITH DIFFERENTIAL/PLATELET
BASOS ABS: 0 10*3/uL (ref 0.0–0.1)
Basophils Relative: 0.7 % (ref 0.0–3.0)
Eosinophils Absolute: 0.5 10*3/uL (ref 0.0–0.7)
Eosinophils Relative: 7 % — ABNORMAL HIGH (ref 0.0–5.0)
HCT: 35.5 % — ABNORMAL LOW (ref 36.0–46.0)
Hemoglobin: 11.6 g/dL — ABNORMAL LOW (ref 12.0–15.0)
LYMPHS ABS: 1.8 10*3/uL (ref 0.7–4.0)
Lymphocytes Relative: 27 % (ref 12.0–46.0)
MCHC: 32.5 g/dL (ref 30.0–36.0)
MCV: 80.4 fl (ref 78.0–100.0)
MONO ABS: 0.6 10*3/uL (ref 0.1–1.0)
MONOS PCT: 8.9 % (ref 3.0–12.0)
NEUTROS ABS: 3.7 10*3/uL (ref 1.4–7.7)
NEUTROS PCT: 56.4 % (ref 43.0–77.0)
PLATELETS: 279 10*3/uL (ref 150.0–400.0)
RBC: 4.42 Mil/uL (ref 3.87–5.11)
RDW: 17.7 % — ABNORMAL HIGH (ref 11.5–15.5)
WBC: 6.5 10*3/uL (ref 4.0–10.5)

## 2015-12-14 LAB — COMPREHENSIVE METABOLIC PANEL
ALT: 10 U/L (ref 0–35)
AST: 15 U/L (ref 0–37)
Albumin: 3.9 g/dL (ref 3.5–5.2)
Alkaline Phosphatase: 93 U/L (ref 39–117)
BUN: 10 mg/dL (ref 6–23)
CO2: 28 meq/L (ref 19–32)
Calcium: 10.5 mg/dL (ref 8.4–10.5)
Chloride: 105 mEq/L (ref 96–112)
Creatinine, Ser: 0.85 mg/dL (ref 0.40–1.20)
GFR: 71.15 mL/min (ref >60.00)
GLUCOSE: 100 mg/dL — AB (ref 70–99)
POTASSIUM: 4.1 meq/L (ref 3.5–5.1)
SODIUM: 140 meq/L (ref 135–145)
TOTAL PROTEIN: 7.3 g/dL (ref 6.0–8.3)
Total Bilirubin: 0.6 mg/dL (ref 0.2–1.2)

## 2015-12-14 LAB — LIPID PANEL
CHOL/HDL RATIO: 4
Cholesterol: 152 mg/dL (ref 0–200)
HDL: 37.6 mg/dL — AB (ref >39.00)
LDL CALC: 95 mg/dL (ref 0–99)
NONHDL: 114.28
Triglycerides: 98 mg/dL (ref 0.0–149.0)
VLDL: 19.6 mg/dL (ref 0.0–40.0)

## 2015-12-14 LAB — TSH: TSH: 3.27 u[IU]/mL (ref 0.35–4.50)

## 2015-12-14 MED ORDER — GABAPENTIN 300 MG PO CAPS: ORAL_CAPSULE | ORAL | 3 refills | Status: DC

## 2015-12-14 MED ORDER — ATORVASTATIN CALCIUM 20 MG PO TABS: 20.0000 mg | ORAL_TABLET | Freq: Every day | ORAL | 3 refills | Status: DC

## 2015-12-14 MED ORDER — OXYCODONE HCL 5 MG PO TABS: ORAL_TABLET | ORAL | 0 refills | Status: DC

## 2015-12-14 NOTE — Assessment & Plan Note (Signed)
The patients weight, height, BMI and visual acuity have been recorded in the chart.  Cognitive function assessed.   I have made referrals, counseling and provided education to the patient based review of the above and I have provided the pt with a written personalized care plan for preventive services.  Influenza vaccine given today.  Orders Placed This Encounter  Procedures  . CBC with Differential/Platelet  . Comprehensive metabolic panel  . Lipid panel  . TSH  . Hepatitis C Antibody

## 2015-12-14 NOTE — Assessment & Plan Note (Signed)
Continue current dose of statin. Due for labs today. 

## 2015-12-14 NOTE — Assessment & Plan Note (Signed)
NSR On pradaxa. No changes made to rxs. Followed by cardiology.

## 2015-12-14 NOTE — Addendum Note (Signed)
Addended by: Modena Nunnery on: 12/14/2015 10:00 AM   Modules accepted: Orders

## 2015-12-14 NOTE — Assessment & Plan Note (Signed)
Receiving knee injections.

## 2015-12-14 NOTE — Assessment & Plan Note (Signed)
Oyxycone rx refilled and given to pt.

## 2015-12-14 NOTE — Patient Instructions (Signed)
Great to see you. We will call you with your results from today. 

## 2015-12-14 NOTE — Assessment & Plan Note (Signed)
Well controlled.  No changes made. 

## 2015-12-14 NOTE — Progress Notes (Signed)
Subjective:   Patient ID: DEKLYN LIKELY, female    DOB: Dec 03, 1950, 65 y.o.   MRN: [id number]  Tina Pacheco is a pleasant 65 y.o. year old female who presents to clinic today with Annual Exam (Medicare)  and follow up of chronic medical conditions on 12/14/2015  HPI:  I have personally reviewed the Medicare Annual Wellness questionnaire and have noted 1. The patient's medical and social history 2. Their use of alcohol, tobacco or illicit drugs 3. Their current medications and supplements 4. The patient's functional ability including ADL's, fall risks, home safety risks and hearing or visual             impairment. 5. Diet and physical activities 6. Evidence for depression or mood disorders  End of life wishes discussed and updated in Social History.  The roster of all physicians providing medical care to patient - is listed in the CareTeams section of the chart.  Remote h/o hysterectomy Colonoscopy 03/17/13 Mammogram 08/09/15 Zostavax 05/09/11 Pneumovax 01/05/14 prevnar 13 06/15/15   Chronic cough- has seen Dr. Melvyn Novas twice for this- notes reviewed from 02/26/15 and 03/22/15. Cough had improved with prednisone and dulera until past few days.  Now having more drainage and cough again but not as severe.  Chronic pain- known DDD of spine, OA of both knees and RA.  Followed by rheum and ortho.  Taking oxycodone.  Also receiving flexogenic injections in her knees.  PAF- followed by cardiology.  NSR.  On anti coagulation.  Lab Results  Component Value Date   CHOL 114 09/16/2013   HDL 43.50 09/16/2013   LDLCALC 55 09/16/2013   TRIG 78.0 09/16/2013   CHOLHDL 3 09/16/2013   Lab Results  Component Value Date   CREATININE 0.74 12/22/2014   Lab Results  Component Value Date   TSH 0.65 07/06/2014   Lab Results  Component Value Date   WBC 8.4 12/22/2014   HGB 11.7 (L) 12/22/2014   HCT 36.4 12/22/2014   MCV 81.8 12/22/2014   PLT 280.0 12/22/2014    Current Outpatient  Prescriptions on File Prior to Visit  Medication Sig Dispense Refill  . amLODipine (NORVASC) 5 MG tablet take 1 tablet by mouth once daily 90 tablet 2  . chlorpheniramine (CHLOR-TRIMETON) 4 MG tablet Take 4 mg by mouth every 4 (four) hours as needed (for drippy nose, drainage, and throat clearing).    Marland Kitchen dextromethorphan (DELSYM) 30 MG/5ML liquid Take 2 tsp twice daily as needed for cough    . famotidine (PEPCID) 20 MG tablet One at bedtime (Patient taking differently: Take 20 mg by mouth at bedtime. ) 30 tablet 11  . Febuxostat (ULORIC PO) Take 40 mg by mouth every morning.     . fluticasone (FLONASE) 50 MCG/ACT nasal spray Place 2 sprays into both nostrils daily as needed (nasal stuffiness).    . hydroxychloroquine (PLAQUENIL) 200 MG tablet Take by mouth 2 (two) times daily.    Marland Kitchen KLOR-CON M20 20 MEQ tablet TAKE 1/2 TABLET (=10MEQ)   TWO TIMES A DAY ; MAY TAKE AND EXTRA TABLET WHEN      TAKING TORSEMIDE 180 tablet 3  . leflunomide (ARAVA) 20 MG tablet Take 20 mg by mouth every morning.     . metoprolol succinate (TOPROL-XL) 50 MG 24 hr tablet take 1 tablet by mouth once daily 30 tablet 3  . montelukast (SINGULAIR) 10 MG tablet take 1 tablet by mouth at bedtime 30 tablet 3  . Multiple Vitamin (MULTIVITAMIN) tablet Take  1 tablet by mouth every morning.     . pantoprazole (PROTONIX) 40 MG tablet Take 40 mg by mouth daily before breakfast.     . PRADAXA 150 MG CAPS capsule TAKE 1 CAPSULE TWICE DAILY 180 capsule 0  . pregabalin (LYRICA) 150 MG capsule Take 150 mg by mouth 3 (three) times daily.    Marland Kitchen Respiratory Therapy Supplies (FLUTTER) DEVI Use as directed 1 each 0  . terazosin (HYTRIN) 10 MG capsule take 1 capsule by mouth at bedtime 90 capsule 2  . torsemide (DEMADEX) 20 MG tablet Take 1 tablet (20 mg total) by mouth 2 (two) times daily. Edema (Patient taking differently: Take 20 mg by mouth 2 (two) times daily as needed. ) 60 tablet 3  . traMADol (ULTRAM) 50 MG tablet Take 50 mg by mouth every 4  (four) hours as needed.    . mometasone-formoterol (DULERA) 100-5 MCG/ACT AERO Inhale 2 puffs into the lungs 2 (two) times daily. 1 Inhaler 0   No current facility-administered medications on file prior to visit.     No Known Allergies  Past Medical History:  Diagnosis Date  . Asthma   . Basal cell carcinoma   . COPD (chronic obstructive pulmonary disease) ([place])   . Diastolic dysfunction   . Diastolic dysfunction    a. echo 07/2014: EF 55-60%, no RWMA, GR2DD, mild MR, LA moderately dilated, PASP 38 mm Hg  . Dyslipidemia   . History of cardiac cath    a. 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 History of gout   . Hyperplastic colonic polyp 2003  . Hypertension   . Hypokalemia   . Morbid obesity ([place])   . PAF (paroxysmal atrial fibrillation) ([facility])    a. on Pradaxa; b. CHADSVASc at least 2 (HTN & female)  . Rheumatoid arthritis(714.0)   . Sleep apnea    a. not compliant with CPAP    Past Surgical History:  Procedure Laterality Date  . CARDIAC CATHETERIZATION  05/24/2010   nonobstructive CAD  . CESAREAN SECTION    . CHOLECYSTECTOMY    . COLONOSCOPY  06/12/2011   Procedure: COLONOSCOPY;  Surgeon: Juanita Craver, MD;  Location: [facility];  Service: Endoscopy;  Laterality: N/A;  . COLONOSCOPY N/A 03/17/2013   Procedure: COLONOSCOPY;  Surgeon: Juanita Craver, MD;  Location: [facility];  Service: Endoscopy;  Laterality: N/A;  . KNEE ARTHROSCOPY     bilateral  . VAGINAL HYSTERECTOMY      Family History  Problem Relation Age of Onset  . Emphysema Father     smoked  . Tuberculosis Mother   . Parkinsonism Mother   . Diabetes type II Sister   . Breast cancer Sister   . Breast cancer Maternal Aunt   . Tuberculosis Sister     Social History   Social History  . Marital status: Married    Spouse name: N/A  . Number of children: N/A  . Years of education: N/A   Occupational History  . Retired    Social History Main Topics  . Smoking status: Never Smoker  .  Smokeless tobacco: Never Used  . Alcohol use No  . Drug use: No  . Sexual activity: Not on file   Other Topics Concern  . Not on file   Social History Narrative  . No narrative on file   The PMH, PSH, Social History, Family History, Medications, and allergies have been reviewed in [facility], and have been updated if relevant.     Review  of Systems  Constitutional: Negative.   HENT: Positive for congestion. Negative for rhinorrhea, sinus pressure, sneezing, sore throat, trouble swallowing and voice change.   Respiratory: Positive for cough. Negative for shortness of breath and stridor.   Cardiovascular: Negative.   Gastrointestinal: Negative.   Endocrine: Negative.   Genitourinary: Negative.   Musculoskeletal: Positive for back pain. Negative for gait problem, joint swelling, myalgias, neck pain and neck stiffness.  Skin: Negative.   Allergic/Immunologic: Negative.   Neurological: Negative.   Hematological: Negative.   Psychiatric/Behavioral: Negative.   All other systems reviewed and are negative.      Objective:    BP 136/72   Pulse 71   Temp 97.7 F (36.5 C) (Oral)   Ht 5' 3.75" (1.619 m)   Wt (!) 326 lb (147.9 kg)   SpO2 95%   BMI 56.40 kg/m    Physical Exam  Constitutional: She is oriented to person, place, and time. She appears well-developed and well-nourished. No distress.  Morbidly obese  HENT:  Head: Normocephalic.  Eyes: Conjunctivae are normal.  Cardiovascular: Normal rate.   Pulmonary/Chest: Effort normal.  Musculoskeletal:  Ataxic gait, mildly ttp over left lumbar parapsinous muscles, exam somewhat limited by body habitus  Neurological: She is alert and oriented to person, place, and time. No cranial nerve deficit.  Skin: Skin is warm and dry.  Psychiatric: She has a normal mood and affect. Her behavior is normal. Judgment normal.  Nursing note and vitals reviewed.         Assessment & Plan:   Essential hypertension  PAF (paroxysmal atrial  fibrillation) (HCC)  Sleep apnea, unspecified type  DDD (degenerative disc disease), lumbosacral  Long term current use of anticoagulant therapy  Severe obesity (BMI >= 40) (HCC)  Hyperlipidemia, unspecified hyperlipidemia type No Follow-up on file.

## 2015-12-15 LAB — HEPATITIS C ANTIBODY: HCV Ab: NEGATIVE

## 2016-01-10 ENCOUNTER — Other Ambulatory Visit: Payer: Self-pay

## 2016-01-10 MED ORDER — OXYCODONE HCL 5 MG PO TABS: ORAL_TABLET | ORAL | 0 refills | Status: DC

## 2016-01-10 NOTE — Telephone Encounter (Signed)
Pt left v/m requesting rx oxycodone. Call when ready for pick up. Last annual exam and last printed # 120 on 12/14/15.

## 2016-01-10 NOTE — Telephone Encounter (Signed)
Spoke to pt and informed her Rx is available for pickup from the front desk 

## 2016-01-13 ENCOUNTER — Other Ambulatory Visit: Payer: Self-pay | Admitting: Cardiovascular Disease

## 2016-01-31 DIAGNOSIS — H04321 Acute dacryocystitis of right lacrimal passage: Secondary | ICD-10-CM | POA: Diagnosis not present

## 2016-02-02 ENCOUNTER — Telehealth: Payer: Self-pay

## 2016-02-02 ENCOUNTER — Ambulatory Visit: Payer: Medicare Other | Admitting: Family Medicine

## 2016-02-02 MED ORDER — TOLTERODINE TARTRATE ER 2 MG PO CP24: 2.0000 mg | ORAL_CAPSULE | Freq: Every day | ORAL | 3 refills | Status: DC

## 2016-02-02 NOTE — Telephone Encounter (Signed)
Patient is requesting a prescription for generic Detrol.  She was on this several months ago and ran out in June.  Since then she has been taking them as needed until the bottle ran out for overactive bladder.  Last seen Office visit: Annual Wellness on10/17/17.   I do not see a record of this medication.  Please advise.  If okay, please send in to [facility] on [address] and she will check with pharmacy tomorrow.   If not okay, she has asked that we call and let her know.

## 2016-02-02 NOTE — Telephone Encounter (Signed)
Yes I sent the eRx refill to [facility]

## 2016-02-14 ENCOUNTER — Other Ambulatory Visit: Payer: Self-pay | Admitting: Family Medicine

## 2016-02-14 ENCOUNTER — Other Ambulatory Visit: Payer: Self-pay

## 2016-02-14 NOTE — Telephone Encounter (Signed)
Ok to print and put on my desk for signature. 

## 2016-02-14 NOTE — Telephone Encounter (Signed)
Pt left v/m requesting rx oxycodone. Call when ready for pick up.last printed # 120 on 01/10/16; last annual on 12/14/15

## 2016-02-15 MED ORDER — OXYCODONE HCL 5 MG PO TABS: ORAL_TABLET | ORAL | 0 refills | Status: DC

## 2016-02-16 NOTE — Telephone Encounter (Signed)
Spoke to pt and informed her Rx is available for pickup from the front desk. Pt advised third party unable to pickup 

## 2016-02-17 ENCOUNTER — Encounter: Payer: Self-pay | Admitting: Family Medicine

## 2016-02-18 ENCOUNTER — Encounter: Payer: Self-pay | Admitting: Family Medicine

## 2016-02-18 DIAGNOSIS — Z79891 Long term (current) use of opiate analgesic: Secondary | ICD-10-CM | POA: Diagnosis not present

## 2016-02-24 DIAGNOSIS — Z79899 Other long term (current) drug therapy: Secondary | ICD-10-CM | POA: Diagnosis not present

## 2016-02-24 DIAGNOSIS — M150 Primary generalized (osteo)arthritis: Secondary | ICD-10-CM | POA: Diagnosis not present

## 2016-02-24 DIAGNOSIS — M0589 Other rheumatoid arthritis with rheumatoid factor of multiple sites: Secondary | ICD-10-CM | POA: Diagnosis not present

## 2016-02-24 DIAGNOSIS — M1A09X0 Idiopathic chronic gout, multiple sites, without tophus (tophi): Secondary | ICD-10-CM | POA: Diagnosis not present

## 2016-02-24 DIAGNOSIS — M797 Fibromyalgia: Secondary | ICD-10-CM | POA: Diagnosis not present

## 2016-02-26 ENCOUNTER — Other Ambulatory Visit: Payer: Self-pay | Admitting: Internal Medicine

## 2016-02-26 DIAGNOSIS — R05 Cough: Secondary | ICD-10-CM

## 2016-02-26 DIAGNOSIS — R058 Other specified cough: Secondary | ICD-10-CM

## 2016-02-28 DIAGNOSIS — Z9842 Cataract extraction status, left eye: Secondary | ICD-10-CM

## 2016-02-28 DIAGNOSIS — Z9841 Cataract extraction status, right eye: Secondary | ICD-10-CM

## 2016-02-28 DIAGNOSIS — H269 Unspecified cataract: Secondary | ICD-10-CM

## 2016-02-28 HISTORY — DX: Unspecified cataract: H26.9

## 2016-02-28 HISTORY — DX: Cataract extraction status, left eye: Z98.42

## 2016-02-28 HISTORY — DX: Cataract extraction status, left eye: Z98.41

## 2016-02-29 ENCOUNTER — Telehealth: Payer: Self-pay | Admitting: Cardiovascular Disease

## 2016-02-29 ENCOUNTER — Other Ambulatory Visit: Payer: Self-pay

## 2016-02-29 DIAGNOSIS — R05 Cough: Secondary | ICD-10-CM

## 2016-02-29 DIAGNOSIS — R058 Other specified cough: Secondary | ICD-10-CM

## 2016-02-29 DIAGNOSIS — H2511 Age-related nuclear cataract, right eye: Secondary | ICD-10-CM | POA: Diagnosis not present

## 2016-02-29 DIAGNOSIS — H02839 Dermatochalasis of unspecified eye, unspecified eyelid: Secondary | ICD-10-CM | POA: Diagnosis not present

## 2016-02-29 DIAGNOSIS — H2513 Age-related nuclear cataract, bilateral: Secondary | ICD-10-CM | POA: Diagnosis not present

## 2016-02-29 DIAGNOSIS — H25043 Posterior subcapsular polar age-related cataract, bilateral: Secondary | ICD-10-CM | POA: Diagnosis not present

## 2016-02-29 DIAGNOSIS — H25013 Cortical age-related cataract, bilateral: Secondary | ICD-10-CM | POA: Diagnosis not present

## 2016-02-29 MED ORDER — FAMOTIDINE 20 MG PO TABS: 20.0000 mg | ORAL_TABLET | Freq: Every day | ORAL | 5 refills | Status: DC

## 2016-02-29 NOTE — Telephone Encounter (Signed)
Received cardiac clearance request for pt to proceed w/ cataract extraction w/ intraocular lens implantation of the right eye followed by the left eye on 04/17/16.  Pt does not need to stop any meds prior to procedure and will be receiving topical anesthesia only. Please [facility], AttnSharyn Lull @ [phone].

## 2016-03-01 NOTE — Telephone Encounter (Signed)
Clearance routed through Epic to number provided.  

## 2016-03-01 NOTE — Telephone Encounter (Signed)
Acceptable risk, no further testing needed 

## 2016-03-09 ENCOUNTER — Other Ambulatory Visit: Payer: Self-pay | Admitting: Family Medicine

## 2016-03-10 DIAGNOSIS — M0589 Other rheumatoid arthritis with rheumatoid factor of multiple sites: Secondary | ICD-10-CM | POA: Diagnosis not present

## 2016-03-10 DIAGNOSIS — M1A09X0 Idiopathic chronic gout, multiple sites, without tophus (tophi): Secondary | ICD-10-CM | POA: Diagnosis not present

## 2016-03-10 DIAGNOSIS — M797 Fibromyalgia: Secondary | ICD-10-CM | POA: Diagnosis not present

## 2016-03-10 DIAGNOSIS — M150 Primary generalized (osteo)arthritis: Secondary | ICD-10-CM | POA: Diagnosis not present

## 2016-03-14 ENCOUNTER — Other Ambulatory Visit: Payer: Self-pay | Admitting: *Deleted

## 2016-03-14 NOTE — Telephone Encounter (Signed)
Patient left a voicemail requesting refill on Oxycodone Last refill 02/15/16 #120 Last office visit 12/14/15

## 2016-03-14 NOTE — Telephone Encounter (Signed)
Ok to print out and leave on my desk for signature. 

## 2016-03-17 MED ORDER — OXYCODONE HCL 5 MG PO TABS: ORAL_TABLET | ORAL | 0 refills | Status: DC

## 2016-03-17 NOTE — Telephone Encounter (Signed)
Spoke to pt and informed her Rx is available for pickup from the front desk 

## 2016-03-17 NOTE — Telephone Encounter (Signed)
Patient will be out of medication tomorrow.  Please call patient at [phone].

## 2016-03-20 ENCOUNTER — Ambulatory Visit (INDEPENDENT_AMBULATORY_CARE_PROVIDER_SITE_OTHER): Payer: Medicare Other | Admitting: Family Medicine

## 2016-03-20 ENCOUNTER — Encounter: Payer: Self-pay | Admitting: Family Medicine

## 2016-03-20 VITALS — BP 134/68 | HR 67 | Temp 97.7°F | Wt 328.8 lb

## 2016-03-20 DIAGNOSIS — M170 Bilateral primary osteoarthritis of knee: Secondary | ICD-10-CM

## 2016-03-20 NOTE — Progress Notes (Signed)
Pre visit review using our clinic review tool, if applicable. No additional management support is needed unless otherwise documented below in the visit note. 

## 2016-03-20 NOTE — Progress Notes (Signed)
Subjective:   Patient ID: Tina Pacheco, female    DOB: [birth year], 66 y.o.   MRN: [id number]  Tina Pacheco is a pleasant 66 y.o. year old female who presents to clinic today with Depression  on 03/20/2016  HPI:   Chronic pain- known DDD of spine, OA of both knees and RA.  Followed by rheum and ortho.  Taking oxycodone.  Also receiving flexogenic injections in her knees.  Just saw rheum last week and was told pain is coming from OA and not RA.  She feels tired and less motivated to get up and do anything.  "pain is depressing." Denies SI or HI. Sleeping ok.  Frustrated with her weight.   Current Outpatient Prescriptions on File Prior to Visit  Medication Sig Dispense Refill  . amLODipine (NORVASC) 5 MG tablet take 1 tablet by mouth once daily 90 tablet 2  . atorvastatin (LIPITOR) 20 MG tablet Take 1 tablet (20 mg total) by mouth daily. 90 tablet 3  . chlorpheniramine (CHLOR-TRIMETON) 4 MG tablet Take 4 mg by mouth every 4 (four) hours as needed (for drippy nose, drainage, and throat clearing).    Marland Kitchen dextromethorphan (DELSYM) 30 MG/5ML liquid Take 2 tsp twice daily as needed for cough    . famotidine (PEPCID) 20 MG tablet Take 1 tablet (20 mg total) by mouth at bedtime. 30 tablet 5  . Febuxostat (ULORIC PO) Take 40 mg by mouth every morning.     . fluticasone (FLONASE) 50 MCG/ACT nasal spray Place 2 sprays into both nostrils daily as needed (nasal stuffiness).    . gabapentin (NEURONTIN) 300 MG capsule take 1 capsule by mouth three times a day (TAKE 1 CAPSULE 2 TO 3 HOURS PRIOR TO BEDTIME AS DIRECTED) 90 capsule 3  . hydroxychloroquine (PLAQUENIL) 200 MG tablet Take by mouth 2 (two) times daily.    Marland Kitchen KLOR-CON M20 20 MEQ tablet TAKE 1/2 TABLET (=10MEQ)   TWO TIMES A DAY ; MAY TAKE AND EXTRA TABLET WHEN      TAKING TORSEMIDE 180 tablet 3  . leflunomide (ARAVA) 20 MG tablet Take 20 mg by mouth every morning.     . metoprolol succinate (TOPROL-XL) 50 MG 24 hr tablet take 1 tablet  by mouth once daily 30 tablet 3  . montelukast (SINGULAIR) 10 MG tablet take 1 tablet by mouth at bedtime 30 tablet 3  . Multiple Vitamin (MULTIVITAMIN) tablet Take 1 tablet by mouth every morning.     Marland Kitchen oxyCODONE (ROXICODONE) 5 MG immediate release tablet Take 2 tablets by mouth every 12 hours 120 tablet 0  . pantoprazole (PROTONIX) 40 MG tablet Take 40 mg by mouth daily before breakfast.     . PRADAXA 150 MG CAPS capsule TAKE 1 CAPSULE TWICE DAILY 180 capsule 3  . pregabalin (LYRICA) 150 MG capsule Take 150 mg by mouth 3 (three) times daily.    Marland Kitchen Respiratory Therapy Supplies (FLUTTER) DEVI Use as directed 1 each 0  . terazosin (HYTRIN) 10 MG capsule take 1 capsule by mouth at bedtime 90 capsule 2  . tolterodine (DETROL LA) 2 MG 24 hr capsule Take 1 capsule (2 mg total) by mouth daily. 303 capsule 3  . torsemide (DEMADEX) 20 MG tablet Take 1 tablet (20 mg total) by mouth 2 (two) times daily. Edema (Patient taking differently: Take 20 mg by mouth 2 (two) times daily as needed. ) 60 tablet 3  . traMADol (ULTRAM) 50 MG tablet Take 50 mg by mouth every 4 (  four) hours as needed.    . mometasone-formoterol (DULERA) 100-5 MCG/ACT AERO Inhale 2 puffs into the lungs 2 (two) times daily. 1 Inhaler 0   No current facility-administered medications on file prior to visit.     No Known Allergies  Past Medical History:  Diagnosis Date  . Asthma   . Basal cell carcinoma   . COPD (chronic obstructive pulmonary disease) ([place])   . Diastolic dysfunction   . Diastolic dysfunction    a. echo 07/2014: EF 55-60%, no RWMA, GR2DD, mild MR, LA moderately dilated, PASP 38 mm Hg  . Dyslipidemia   . History of cardiac cath    a. 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 History of gout   . Hyperplastic colonic polyp 2003  . Hypertension   . Hypokalemia   . Morbid obesity ([place])   . PAF (paroxysmal atrial fibrillation) (HCC)    a. on Pradaxa; b. CHADSVASc at least 2 (HTN & female)  . Rheumatoid  arthritis(714.0)   . Sleep apnea    a. not compliant with CPAP    Past Surgical History:  Procedure Laterality Date  . CARDIAC CATHETERIZATION  05/24/2010   nonobstructive CAD  . CESAREAN SECTION    . CHOLECYSTECTOMY    . COLONOSCOPY  06/12/2011   Procedure: COLONOSCOPY;  Surgeon: Juanita Craver, MD;  Location: [facility];  Service: Endoscopy;  Laterality: N/A;  . COLONOSCOPY N/A 03/17/2013   Procedure: COLONOSCOPY;  Surgeon: Juanita Craver, MD;  Location: [facility];  Service: Endoscopy;  Laterality: N/A;  . KNEE ARTHROSCOPY     bilateral  . VAGINAL HYSTERECTOMY      Family History  Problem Relation Age of Onset  . Emphysema Father     smoked  . Tuberculosis Mother   . Parkinsonism Mother   . Diabetes type II Sister   . Breast cancer Sister   . Breast cancer Maternal Aunt   . Tuberculosis Sister     Social History   Social History  . Marital status: Married    Spouse name: N/A  . Number of children: N/A  . Years of education: N/A   Occupational History  . Retired    Social History Main Topics  . Smoking status: Never Smoker  . Smokeless tobacco: Never Used  . Alcohol use No  . Drug use: No  . Sexual activity: Not on file   Other Topics Concern  . Not on file   Social History Narrative   Desires CPR   The PMH, PSH, Social History, Family History, Medications, and allergies have been reviewed in [facility], and have been updated if relevant.   Review of Systems  Psychiatric/Behavioral: Positive for dysphoric mood. Negative for agitation, behavioral problems, confusion, decreased concentration, hallucinations, self-injury, sleep disturbance and suicidal ideas. The patient is not nervous/anxious and is not hyperactive.   All other systems reviewed and are negative.      Objective:    BP 134/68   Pulse 67   Temp 97.7 F (36.5 C) (Oral)   Wt (!) 328 lb 12 oz (149.1 kg)   SpO2 97%   BMI 56.87 kg/m    Physical Exam  Constitutional: She is oriented to person,  place, and time. No distress.  obese  HENT:  Head: Normocephalic.  Eyes: Conjunctivae are normal.  Cardiovascular: Normal rate.   Pulmonary/Chest: Effort normal.  Musculoskeletal: Normal range of motion.  Neurological: She is alert and oriented to person, place, and time. No cranial nerve deficit.  Skin: Skin  is Pacheco and dry. She is not diaphoretic.  Psychiatric: She has a normal mood and affect. Her behavior is normal. Thought content normal.  Nursing note and vitals reviewed.         Assessment & Plan:   Severe obesity (BMI >= 40) (HCC)  Osteoarthritis of both knees, unspecified osteoarthritis type No Follow-up on file.

## 2016-03-20 NOTE — Assessment & Plan Note (Signed)
>  25 minutes spent in face to face time with patient, >50% spent in counselling or coordination of care discussing OA, obesity and anhedonia.  Discussed possibly starting cymbalta for pain and depressive symptoms and or seeing therapist but she feels she is not depressed and does not want to start another rx at this time. She will continue to keep me updated.

## 2016-03-20 NOTE — Patient Instructions (Signed)
Good to see you.  Cymbalta is the medication I discussed with you today.

## 2016-03-22 ENCOUNTER — Other Ambulatory Visit: Payer: Self-pay | Admitting: Cardiovascular Disease

## 2016-04-10 ENCOUNTER — Other Ambulatory Visit: Payer: Self-pay

## 2016-04-10 DIAGNOSIS — M65342 Trigger finger, left ring finger: Secondary | ICD-10-CM | POA: Diagnosis not present

## 2016-04-10 DIAGNOSIS — M19031 Primary osteoarthritis, right wrist: Secondary | ICD-10-CM | POA: Diagnosis not present

## 2016-04-10 DIAGNOSIS — M19032 Primary osteoarthritis, left wrist: Secondary | ICD-10-CM | POA: Diagnosis not present

## 2016-04-10 DIAGNOSIS — M19071 Primary osteoarthritis, right ankle and foot: Secondary | ICD-10-CM | POA: Diagnosis not present

## 2016-04-10 DIAGNOSIS — M19079 Primary osteoarthritis, unspecified ankle and foot: Secondary | ICD-10-CM | POA: Diagnosis not present

## 2016-04-10 MED ORDER — OXYCODONE HCL 5 MG PO TABS: ORAL_TABLET | ORAL | 0 refills | Status: DC

## 2016-04-10 NOTE — Telephone Encounter (Signed)
Pt left v/m requesting rx oxycodone. Call when ready for pick up. Last printed # 120 on 03/17/16. Pt last seen 03/20/16.

## 2016-04-10 NOTE — Telephone Encounter (Signed)
Spoke to pt and informed her Rx is available for pickup the front desk

## 2016-04-13 DIAGNOSIS — M65342 Trigger finger, left ring finger: Secondary | ICD-10-CM | POA: Diagnosis not present

## 2016-04-13 DIAGNOSIS — M654 Radial styloid tenosynovitis [de Quervain]: Secondary | ICD-10-CM | POA: Diagnosis not present

## 2016-04-25 ENCOUNTER — Encounter: Payer: Self-pay | Admitting: Family Medicine

## 2016-04-25 ENCOUNTER — Ambulatory Visit (INDEPENDENT_AMBULATORY_CARE_PROVIDER_SITE_OTHER): Payer: Medicare Other | Admitting: Family Medicine

## 2016-04-25 VITALS — BP 148/84 | HR 78 | Temp 98.3°F | Ht 63.0 in | Wt 331.8 lb

## 2016-04-25 DIAGNOSIS — R059 Cough, unspecified: Secondary | ICD-10-CM

## 2016-04-25 DIAGNOSIS — R0982 Postnasal drip: Secondary | ICD-10-CM

## 2016-04-25 DIAGNOSIS — J209 Acute bronchitis, unspecified: Secondary | ICD-10-CM

## 2016-04-25 DIAGNOSIS — R05 Cough: Secondary | ICD-10-CM | POA: Diagnosis not present

## 2016-04-25 MED ORDER — PREDNISONE 10 MG PO TABS: ORAL_TABLET | ORAL | 0 refills | Status: DC

## 2016-04-25 MED ORDER — HYDROCODONE-HOMATROPINE 5-1.5 MG/5ML PO SYRP: 5.0000 mL | ORAL_SOLUTION | Freq: Three times a day (TID) | ORAL | 0 refills | Status: DC | PRN

## 2016-04-25 MED ORDER — ALBUTEROL SULFATE HFA 108 (90 BASE) MCG/ACT IN AERS: 2.0000 | INHALATION_SPRAY | Freq: Four times a day (QID) | RESPIRATORY_TRACT | 0 refills | Status: DC | PRN

## 2016-04-25 MED ORDER — DOXYCYCLINE HYCLATE 100 MG PO TABS: 100.0000 mg | ORAL_TABLET | Freq: Two times a day (BID) | ORAL | 0 refills | Status: DC

## 2016-04-25 NOTE — Progress Notes (Signed)
Pre visit review using our clinic review tool, if applicable. No additional management support is needed unless otherwise documented below in the visit note. 

## 2016-04-25 NOTE — Assessment & Plan Note (Signed)
Doxycycline 100 mg twice daily x 10 days, prednisone burst. proair inhaler refilled. Rx printed for hycodan to use for severe cough, ok to use delsym for less mild cough. Call or return to clinic prn if these symptoms worsen or fail to improve as anticipated. The patient indicates understanding of these issues and agrees with the plan.

## 2016-04-25 NOTE — Progress Notes (Signed)
Subjective:   Patient ID: Tina Pacheco, female    DOB: 15-Apr-1950, 66 y.o.   MRN: [id number]  Tina Pacheco is a pleasant 66 y.o. year old female who presents to clinic today with Cough and Shortness of Breath  on 04/25/2016  HPI:  Almost two week so progressive cough, wheezing, SOB. No fever.  But has had chills and body aches.  Husband saw his PCP for similar symptoms yesterday and was placed on abx for bronchitis. She did have some post tussive emesis this weekend.  Takes OTC deslym as needed but that has not helped with this cough, unfortunately.     Current Outpatient Prescriptions on File Prior to Visit  Medication Sig Dispense Refill  . amLODipine (NORVASC) 5 MG tablet take 1 tablet by mouth once daily 90 tablet 2  . atorvastatin (LIPITOR) 20 MG tablet Take 1 tablet (20 mg total) by mouth daily. 90 tablet 3  . chlorpheniramine (CHLOR-TRIMETON) 4 MG tablet Take 4 mg by mouth every 4 (four) hours as needed (for drippy nose, drainage, and throat clearing).    Marland Kitchen dextromethorphan (DELSYM) 30 MG/5ML liquid Take 2 tsp twice daily as needed for cough    . famotidine (PEPCID) 20 MG tablet Take 1 tablet (20 mg total) by mouth at bedtime. 30 tablet 5  . Febuxostat (ULORIC PO) Take 40 mg by mouth every morning.     . fluticasone (FLONASE) 50 MCG/ACT nasal spray Place 2 sprays into both nostrils daily as needed (nasal stuffiness).    . gabapentin (NEURONTIN) 300 MG capsule take 1 capsule by mouth three times a day (TAKE 1 CAPSULE 2 TO 3 HOURS PRIOR TO BEDTIME AS DIRECTED) 90 capsule 3  . hydroxychloroquine (PLAQUENIL) 200 MG tablet Take by mouth 2 (two) times daily.    Marland Kitchen KLOR-CON M20 20 MEQ tablet TAKE 1/2 TABLET (=10MEQ)   TWO TIMES A DAY ; MAY TAKE AND EXTRA TABLET WHEN      TAKING TORSEMIDE 180 tablet 3  . leflunomide (ARAVA) 20 MG tablet Take 20 mg by mouth every morning.     . metoprolol succinate (TOPROL-XL) 50 MG 24 hr tablet take 1 tablet by mouth once daily 30 tablet 3  .  montelukast (SINGULAIR) 10 MG tablet take 1 tablet by mouth at bedtime 30 tablet 3  . Multiple Vitamin (MULTIVITAMIN) tablet Take 1 tablet by mouth every morning.     Marland Kitchen oxyCODONE (ROXICODONE) 5 MG immediate release tablet Take 2 tablets by mouth every 12 hours 120 tablet 0  . pantoprazole (PROTONIX) 40 MG tablet Take 40 mg by mouth daily before breakfast.     . PRADAXA 150 MG CAPS capsule TAKE 1 CAPSULE TWICE DAILY 180 capsule 3  . pregabalin (LYRICA) 150 MG capsule Take 150 mg by mouth 3 (three) times daily.    Marland Kitchen Respiratory Therapy Supplies (FLUTTER) DEVI Use as directed 1 each 0  . terazosin (HYTRIN) 10 MG capsule take 1 capsule by mouth at bedtime 90 capsule 2  . tolterodine (DETROL LA) 2 MG 24 hr capsule Take 1 capsule (2 mg total) by mouth daily. 303 capsule 3  . torsemide (DEMADEX) 20 MG tablet Take 1 tablet (20 mg total) by mouth 2 (two) times daily. Edema (Patient taking differently: Take 20 mg by mouth 2 (two) times daily as needed. ) 60 tablet 3  . traMADol (ULTRAM) 50 MG tablet Take 50 mg by mouth every 4 (four) hours as needed.    . mometasone-formoterol (DULERA) 100-5  MCG/ACT AERO Inhale 2 puffs into the lungs 2 (two) times daily. 1 Inhaler 0   No current facility-administered medications on file prior to visit.     No Known Allergies  Past Medical History:  Diagnosis Date  . Asthma   . Basal cell carcinoma   . COPD (chronic obstructive pulmonary disease) ([place])   . Diastolic dysfunction   . Diastolic dysfunction    a. echo 07/2014: EF 55-60%, no RWMA, GR2DD, mild MR, LA moderately dilated, PASP 38 mm Hg  . Dyslipidemia   . History of cardiac cath    a. 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 History of gout   . Hyperplastic colonic polyp 2003  . Hypertension   . Hypokalemia   . Morbid obesity ([place])   . PAF (paroxysmal atrial fibrillation) ([facility])    a. on Pradaxa; b. CHADSVASc at least 2 (HTN & female)  . Rheumatoid arthritis(714.0)   . Sleep apnea    a. not  compliant with CPAP    Past Surgical History:  Procedure Laterality Date  . CARDIAC CATHETERIZATION  05/24/2010   nonobstructive CAD  . CESAREAN SECTION    . CHOLECYSTECTOMY    . COLONOSCOPY  06/12/2011   Procedure: COLONOSCOPY;  Surgeon: Juanita Craver, MD;  Location: [facility];  Service: Endoscopy;  Laterality: N/A;  . COLONOSCOPY N/A 03/17/2013   Procedure: COLONOSCOPY;  Surgeon: Juanita Craver, MD;  Location: [facility];  Service: Endoscopy;  Laterality: N/A;  . KNEE ARTHROSCOPY     bilateral  . VAGINAL HYSTERECTOMY      Family History  Problem Relation Age of Onset  . Emphysema Father     smoked  . Tuberculosis Mother   . Parkinsonism Mother   . Diabetes type II Sister   . Breast cancer Sister   . Breast cancer Maternal Aunt   . Tuberculosis Sister     Social History   Social History  . Marital status: Married    Spouse name: N/A  . Number of children: N/A  . Years of education: N/A   Occupational History  . Retired    Social History Main Topics  . Smoking status: Never Smoker  . Smokeless tobacco: Never Used  . Alcohol use No  . Drug use: No  . Sexual activity: Not on file   Other Topics Concern  . Not on file   Social History Narrative   Desires CPR   The PMH, PSH, Social History, Family History, Medications, and allergies have been reviewed in [facility], and have been updated if relevant.   Review of Systems  Constitutional: Positive for fatigue. Negative for fever.  HENT: Positive for congestion. Negative for rhinorrhea, sinus pain, sinus pressure, sneezing, sore throat and trouble swallowing.   Respiratory: Positive for cough, shortness of breath and wheezing.   Gastrointestinal: Positive for vomiting. Negative for nausea.  Neurological: Negative for dizziness.  All other systems reviewed and are negative.      Objective:    BP (!) 148/84 (BP Location: Left Arm, Patient Position: Sitting, Cuff Size: Large)   Pulse 78   Temp 98.3 F (36.8 C)  (Oral)   Ht 5\' 3"  (1.6 m)   Wt (!) 331 lb 12.8 oz (150.5 kg)   SpO2 96%   BMI 58.78 kg/m    Physical Exam  Constitutional: She is oriented to person, place, and time. She appears well-developed.  Harsh wet cough, audible wheezing from across the room  HENT:  Head: Normocephalic and atraumatic.  Eyes: Conjunctivae are normal.  Cardiovascular: Normal rate.   Pulmonary/Chest: She has wheezes.  Musculoskeletal: Normal range of motion.  Neurological: She is alert and oriented to person, place, and time. No cranial nerve deficit.  Skin: Skin is dry. She is not diaphoretic.  Psychiatric: She has a normal mood and affect. Her behavior is normal. Judgment and thought content normal.  Nursing note and vitals reviewed.         Assessment & Plan:   Bronchitis, acute, with bronchospasm  Cough - Plan: HYDROcodone-homatropine (HYCODAN) 5-1.5 MG/5ML syrup  Post-nasal drip - Plan: HYDROcodone-homatropine (HYCODAN) 5-1.5 MG/5ML syrup No Follow-up on file.

## 2016-05-08 ENCOUNTER — Other Ambulatory Visit: Payer: Self-pay

## 2016-05-08 DIAGNOSIS — R059 Cough, unspecified: Secondary | ICD-10-CM

## 2016-05-08 DIAGNOSIS — R0982 Postnasal drip: Secondary | ICD-10-CM

## 2016-05-08 DIAGNOSIS — R05 Cough: Secondary | ICD-10-CM

## 2016-05-08 MED ORDER — OXYCODONE HCL 5 MG PO TABS: ORAL_TABLET | ORAL | 0 refills | Status: DC

## 2016-05-08 NOTE — Telephone Encounter (Signed)
Spoke to pt. Rx up front ready for pickup 

## 2016-05-08 NOTE — Telephone Encounter (Signed)
Pt left v/m requesting rx oxycodone. Call when ready for pick up.last printed oxycodone # 120 on 04/10/16. Last seen annual 12/14/15.

## 2016-05-09 DIAGNOSIS — M19171 Post-traumatic osteoarthritis, right ankle and foot: Secondary | ICD-10-CM | POA: Diagnosis not present

## 2016-05-09 DIAGNOSIS — M19071 Primary osteoarthritis, right ankle and foot: Secondary | ICD-10-CM | POA: Diagnosis not present

## 2016-05-09 DIAGNOSIS — M79671 Pain in right foot: Secondary | ICD-10-CM | POA: Diagnosis not present

## 2016-05-09 DIAGNOSIS — S92254P Nondisplaced fracture of navicular [scaphoid] of right foot, subsequent encounter for fracture with malunion: Secondary | ICD-10-CM | POA: Diagnosis not present

## 2016-05-16 DIAGNOSIS — M79671 Pain in right foot: Secondary | ICD-10-CM | POA: Diagnosis not present

## 2016-05-16 DIAGNOSIS — S92254P Nondisplaced fracture of navicular [scaphoid] of right foot, subsequent encounter for fracture with malunion: Secondary | ICD-10-CM | POA: Diagnosis not present

## 2016-05-16 DIAGNOSIS — M19171 Post-traumatic osteoarthritis, right ankle and foot: Secondary | ICD-10-CM | POA: Diagnosis not present

## 2016-06-08 ENCOUNTER — Other Ambulatory Visit: Payer: Self-pay

## 2016-06-08 DIAGNOSIS — M797 Fibromyalgia: Secondary | ICD-10-CM | POA: Diagnosis not present

## 2016-06-08 DIAGNOSIS — M150 Primary generalized (osteo)arthritis: Secondary | ICD-10-CM | POA: Diagnosis not present

## 2016-06-08 DIAGNOSIS — M0589 Other rheumatoid arthritis with rheumatoid factor of multiple sites: Secondary | ICD-10-CM | POA: Diagnosis not present

## 2016-06-08 DIAGNOSIS — M1A09X0 Idiopathic chronic gout, multiple sites, without tophus (tophi): Secondary | ICD-10-CM | POA: Diagnosis not present

## 2016-06-08 MED ORDER — OXYCODONE HCL 5 MG PO TABS: ORAL_TABLET | ORAL | 0 refills | Status: DC

## 2016-06-08 NOTE — Telephone Encounter (Signed)
Pt left v/m requesting rx oxycodone. Call when ready for pick up. Last printed # 120 on 05/08/16. Last seen 03/20/16. Dr Deborra Medina out of office until 06/12/16.

## 2016-06-08 NOTE — Telephone Encounter (Signed)
Called and spoke to Mrs. Maiorana that her prescription is ready for pick up at the front desk.

## 2016-06-09 DIAGNOSIS — M0589 Other rheumatoid arthritis with rheumatoid factor of multiple sites: Secondary | ICD-10-CM | POA: Diagnosis not present

## 2016-06-12 DIAGNOSIS — H2511 Age-related nuclear cataract, right eye: Secondary | ICD-10-CM | POA: Diagnosis not present

## 2016-06-12 DIAGNOSIS — Z961 Presence of intraocular lens: Secondary | ICD-10-CM | POA: Diagnosis not present

## 2016-06-12 HISTORY — PX: CATARACT EXTRACTION W/ INTRAOCULAR LENS IMPLANT: SHX1309

## 2016-06-13 DIAGNOSIS — H2512 Age-related nuclear cataract, left eye: Secondary | ICD-10-CM | POA: Diagnosis not present

## 2016-06-21 DIAGNOSIS — S92254P Nondisplaced fracture of navicular [scaphoid] of right foot, subsequent encounter for fracture with malunion: Secondary | ICD-10-CM | POA: Diagnosis not present

## 2016-06-21 DIAGNOSIS — M19171 Post-traumatic osteoarthritis, right ankle and foot: Secondary | ICD-10-CM | POA: Diagnosis not present

## 2016-06-26 ENCOUNTER — Telehealth: Payer: Self-pay | Admitting: Family Medicine

## 2016-06-26 DIAGNOSIS — H2512 Age-related nuclear cataract, left eye: Secondary | ICD-10-CM | POA: Diagnosis not present

## 2016-06-26 DIAGNOSIS — Z961 Presence of intraocular lens: Secondary | ICD-10-CM | POA: Diagnosis not present

## 2016-06-26 HISTORY — PX: CATARACT EXTRACTION W/ INTRAOCULAR LENS IMPLANT: SHX1309

## 2016-06-26 NOTE — Telephone Encounter (Signed)
Pt has appt with Dr Deborra Medina on 06/28/16 at [place].

## 2016-06-26 NOTE — Telephone Encounter (Signed)
Patient Name: Tina Pacheco  DOB: [birth year]    Initial Comment Caller states having dizziness and balance is off; not new; also foot-problems for months; saw ortho and pod; broken bone in foot and in a boot now; and eyes-had cataract surgery today on left eye; RT was 16th; and hands-saw hand MD and trigger finger has changed some-more pain-will be seeing hand doctor again;    Nurse Assessment  Nurse: Raphael Gibney, RN, Vanita Ingles Date/Time (Eastern Time): 06/26/2016 2:32:50 PM  Confirm and document reason for call. If symptomatic, describe symptoms. ---Caller states she is dizzy and balance is off. Needs handicap permit that needs to be renewed. She has a boot on her foot due to broken bone in right foot. Had cataract surgery on her left eye today and her right eye 2 weeks ago. has seen the hand doctor 2 weeks ago and has a trigger finger. Has pain on her palm.  Does the patient have any new or worsening symptoms? ---Yes  Will a triage be completed? ---Yes  Related visit to physician within the last 2 weeks? ---No  Does the PT have any chronic conditions? (i.e. diabetes, asthma, etc.) ---Yes  List chronic conditions. ---osteoarthritis;  Is this a behavioral health or substance abuse call? ---No     Guidelines    Guideline Title Affirmed Question Affirmed Notes  Dizziness - Vertigo [1] MODERATE dizziness (e.g., vertigo; feels very unsteady, interferes with normal activities) AND [2] has been evaluated by physician for this    Final Disposition User   See PCP When Office is Open (within 3 days) Raphael Gibney, RN, Vanita Ingles    Comments  appt scheduled for 06/28/2016 at 9 am with Dr. Arnette Norris   Referrals  REFERRED TO PCP OFFICE   Disagree/Comply: Comply

## 2016-06-28 ENCOUNTER — Encounter: Payer: Self-pay | Admitting: Family Medicine

## 2016-06-28 ENCOUNTER — Ambulatory Visit (INDEPENDENT_AMBULATORY_CARE_PROVIDER_SITE_OTHER): Payer: Medicare Other | Admitting: Family Medicine

## 2016-06-28 DIAGNOSIS — R42 Dizziness and giddiness: Secondary | ICD-10-CM | POA: Insufficient documentation

## 2016-06-28 NOTE — Progress Notes (Signed)
Subjective:   Patient ID: Tina Pacheco, female    DOB: Apr 16, 1950, 66 y.o.   MRN: [id number]  Tina Pacheco is a pleasant 66 y.o. year old female who presents to clinic today with Dizziness  on 06/28/2016  HPI:  History of BPV.  Had symptoms earlier this week when she was looking up to put eye drops in her eyes.    She actually fell over when this happened but did not injure herself.  Being followed by ortho and has been wearing a boot on her right foot which makes balance difficult. This is why she feels she feel during her spell with vertigo.  Feels less dizzy today.  Does not feel she needs to take meclizine today.  Current Outpatient Prescriptions on File Prior to Visit  Medication Sig Dispense Refill  . albuterol (PROVENTIL HFA;VENTOLIN HFA) 108 (90 Base) MCG/ACT inhaler Inhale 2 puffs into the lungs every 6 (six) hours as needed. 1 Inhaler 0  . amLODipine (NORVASC) 5 MG tablet take 1 tablet by mouth once daily 90 tablet 2  . atorvastatin (LIPITOR) 20 MG tablet Take 1 tablet (20 mg total) by mouth daily. 90 tablet 3  . chlorpheniramine (CHLOR-TRIMETON) 4 MG tablet Take 4 mg by mouth every 4 (four) hours as needed (for drippy nose, drainage, and throat clearing).    Marland Kitchen dextromethorphan (DELSYM) 30 MG/5ML liquid Take 2 tsp twice daily as needed for cough    . doxycycline (VIBRA-TABS) 100 MG tablet Take 1 tablet (100 mg total) by mouth every 12 (twelve) hours. 20 tablet 0  . famotidine (PEPCID) 20 MG tablet Take 1 tablet (20 mg total) by mouth at bedtime. 30 tablet 5  . Febuxostat (ULORIC PO) Take 40 mg by mouth every morning.     . fluticasone (FLONASE) 50 MCG/ACT nasal spray Place 2 sprays into both nostrils daily as needed (nasal stuffiness).    . gabapentin (NEURONTIN) 300 MG capsule take 1 capsule by mouth three times a day (TAKE 1 CAPSULE 2 TO 3 HOURS PRIOR TO BEDTIME AS DIRECTED) 90 capsule 3  . HYDROcodone-homatropine (HYCODAN) 5-1.5 MG/5ML syrup Take 5 mLs by mouth every 8  (eight) hours as needed for cough. 120 mL 0  . hydroxychloroquine (PLAQUENIL) 200 MG tablet Take by mouth 2 (two) times daily.    Marland Kitchen KLOR-CON M20 20 MEQ tablet TAKE 1/2 TABLET (=10MEQ)   TWO TIMES A DAY ; MAY TAKE AND EXTRA TABLET WHEN      TAKING TORSEMIDE 180 tablet 3  . leflunomide (ARAVA) 20 MG tablet Take 20 mg by mouth every morning.     . metoprolol succinate (TOPROL-XL) 50 MG 24 hr tablet take 1 tablet by mouth once daily 30 tablet 3  . mometasone-formoterol (DULERA) 100-5 MCG/ACT AERO Inhale 2 puffs into the lungs 2 (two) times daily. 1 Inhaler 0  . montelukast (SINGULAIR) 10 MG tablet take 1 tablet by mouth at bedtime 30 tablet 3  . Multiple Vitamin (MULTIVITAMIN) tablet Take 1 tablet by mouth every morning.     Marland Kitchen oxyCODONE (ROXICODONE) 5 MG immediate release tablet Take 2 tablets by mouth every 12 hours 120 tablet 0  . pantoprazole (PROTONIX) 40 MG tablet Take 40 mg by mouth daily before breakfast.     . PRADAXA 150 MG CAPS capsule TAKE 1 CAPSULE TWICE DAILY 180 capsule 3  . predniSONE (DELTASONE) 10 MG tablet Take  4 each am x 2 days,   2 each am x 2 days,  1  each am x 2 days and stop 14 tablet 0  . pregabalin (LYRICA) 150 MG capsule Take 150 mg by mouth 3 (three) times daily.    Marland Kitchen Respiratory Therapy Supplies (FLUTTER) DEVI Use as directed 1 each 0  . terazosin (HYTRIN) 10 MG capsule take 1 capsule by mouth at bedtime 90 capsule 2  . tolterodine (DETROL LA) 2 MG 24 hr capsule Take 1 capsule (2 mg total) by mouth daily. 303 capsule 3  . torsemide (DEMADEX) 20 MG tablet Take 1 tablet (20 mg total) by mouth 2 (two) times daily. Edema (Patient taking differently: Take 20 mg by mouth 2 (two) times daily as needed. ) 60 tablet 3  . traMADol (ULTRAM) 50 MG tablet Take 50 mg by mouth every 4 (four) hours as needed.     No current facility-administered medications on file prior to visit.     No Known Allergies  Past Medical History:  Diagnosis Date  . Asthma   . Basal cell carcinoma     . COPD (chronic obstructive pulmonary disease) ([place])   . Diastolic dysfunction   . Diastolic dysfunction    a. echo 07/2014: EF 55-60%, no RWMA, GR2DD, mild MR, LA moderately dilated, PASP 38 mm Hg  . Dyslipidemia   . History of cardiac cath    a. 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 History of gout   . Hyperplastic colonic polyp 2003  . Hypertension   . Hypokalemia   . Morbid obesity ([place])   . PAF (paroxysmal atrial fibrillation) ([facility])    a. on Pradaxa; b. CHADSVASc at least 2 (HTN & female)  . Rheumatoid arthritis(714.0)   . Sleep apnea    a. not compliant with CPAP    Past Surgical History:  Procedure Laterality Date  . CARDIAC CATHETERIZATION  05/24/2010   nonobstructive CAD  . CESAREAN SECTION    . CHOLECYSTECTOMY    . COLONOSCOPY  06/12/2011   Procedure: COLONOSCOPY;  Surgeon: Juanita Craver, MD;  Location: [facility];  Service: Endoscopy;  Laterality: N/A;  . COLONOSCOPY N/A 03/17/2013   Procedure: COLONOSCOPY;  Surgeon: Juanita Craver, MD;  Location: [facility];  Service: Endoscopy;  Laterality: N/A;  . KNEE ARTHROSCOPY     bilateral  . VAGINAL HYSTERECTOMY      Family History  Problem Relation Age of Onset  . Emphysema Father     smoked  . Tuberculosis Mother   . Parkinsonism Mother   . Diabetes type II Sister   . Breast cancer Sister   . Breast cancer Maternal Aunt   . Tuberculosis Sister     Social History   Social History  . Marital status: Married    Spouse name: N/A  . Number of children: N/A  . Years of education: N/A   Occupational History  . Retired    Social History Main Topics  . Smoking status: Never Smoker  . Smokeless tobacco: Never Used  . Alcohol use No  . Drug use: No  . Sexual activity: Not on file   Other Topics Concern  . Not on file   Social History Narrative   Desires CPR   The PMH, PSH, Social History, Family History, Medications, and allergies have been reviewed in [facility], and have been updated if  relevant.   Review of Systems  Musculoskeletal: Positive for back pain and gait problem.  Neurological: Positive for dizziness. Negative for tremors, seizures, syncope, facial asymmetry, speech difficulty, weakness, light-headedness, numbness and headaches.  All other  systems reviewed and are negative.      Objective:    BP 138/70 (BP Location: Left Arm, Patient Position: Sitting, Cuff Size: Large)   Pulse 66   Wt (!) 344 lb 6.4 oz (156.2 kg)   BMI 61.01 kg/m    Physical Exam  Constitutional: She is oriented to person, place, and time. She appears well-developed and well-nourished. No distress.  HENT:  Head: Normocephalic and atraumatic.  Eyes: Conjunctivae are normal.  Cardiovascular: Normal rate.   Pulmonary/Chest: Effort normal.  Musculoskeletal:  Right boot  Neurological: She is alert and oriented to person, place, and time. No cranial nerve deficit.  Skin: Skin is warm and dry. She is not diaphoretic.  Psychiatric: She has a normal mood and affect. Her behavior is normal. Judgment and thought content normal.  Nursing note and vitals reviewed.         Assessment & Plan:   Vertigo No Follow-up on file.

## 2016-06-29 NOTE — Assessment & Plan Note (Signed)
Resolving and exam reassuring. No further tx or work up necessary at this time. Call or return to clinic prn if these symptoms worsen or fail to improve as anticipated. The patient indicates understanding of these issues and agrees with the plan.

## 2016-07-07 ENCOUNTER — Other Ambulatory Visit: Payer: Self-pay

## 2016-07-07 NOTE — Telephone Encounter (Signed)
Ok to print out and put on my desk for signature.

## 2016-07-07 NOTE — Telephone Encounter (Signed)
Will have Araceli print Monday

## 2016-07-07 NOTE — Telephone Encounter (Signed)
Pt left v/m requesting rx oxycodone. Call when ready for pick up. Last printed # 120 on 06/08/16. Last seen to discuss oxycodone 03/20/16 but acute visit 06/28/16.

## 2016-07-10 MED ORDER — OXYCODONE HCL 5 MG PO TABS: ORAL_TABLET | ORAL | 0 refills | Status: DC

## 2016-07-11 ENCOUNTER — Other Ambulatory Visit: Payer: Self-pay | Admitting: Family Medicine

## 2016-07-11 DIAGNOSIS — M810 Age-related osteoporosis without current pathological fracture: Secondary | ICD-10-CM | POA: Diagnosis not present

## 2016-07-12 ENCOUNTER — Encounter: Payer: Self-pay | Admitting: Family Medicine

## 2016-07-12 DIAGNOSIS — Z79891 Long term (current) use of opiate analgesic: Secondary | ICD-10-CM | POA: Diagnosis not present

## 2016-07-12 NOTE — Telephone Encounter (Signed)
rx placed in blue folder, pt aware.

## 2016-07-19 DIAGNOSIS — S92254P Nondisplaced fracture of navicular [scaphoid] of right foot, subsequent encounter for fracture with malunion: Secondary | ICD-10-CM | POA: Diagnosis not present

## 2016-07-19 DIAGNOSIS — M19171 Post-traumatic osteoarthritis, right ankle and foot: Secondary | ICD-10-CM | POA: Diagnosis not present

## 2016-07-21 ENCOUNTER — Other Ambulatory Visit: Payer: Self-pay | Admitting: Cardiovascular Disease

## 2016-07-28 ENCOUNTER — Other Ambulatory Visit: Payer: Self-pay | Admitting: Family Medicine

## 2016-08-06 ENCOUNTER — Other Ambulatory Visit: Payer: Self-pay | Admitting: Family Medicine

## 2016-08-07 ENCOUNTER — Other Ambulatory Visit: Payer: Self-pay

## 2016-08-07 NOTE — Telephone Encounter (Signed)
Last refill 12/14/15 Last OV 06/28/16 Ok to refill?

## 2016-08-07 NOTE — Telephone Encounter (Signed)
Pt left v/m requesting rx oxycodone. Call when ready for pick up.last printed # 120 on 07/10/16; last acute visit 06/28/16.

## 2016-08-08 MED ORDER — OXYCODONE HCL 5 MG PO TABS: ORAL_TABLET | ORAL | 0 refills | Status: DC

## 2016-08-08 NOTE — Telephone Encounter (Signed)
Indication for chronic opioid: DDD  Medication and dose: oxycodone 5 mg - 2 tabs by mouth every 12 hours # pills per month: 120 Last UDS date: 07/02/16 Pain contract signed (Y/N): Y Date narcotic database last reviewed (include red flags): 08/08/16

## 2016-08-09 ENCOUNTER — Encounter: Payer: Self-pay | Admitting: Family Medicine

## 2016-08-09 DIAGNOSIS — Z803 Family history of malignant neoplasm of breast: Secondary | ICD-10-CM | POA: Diagnosis not present

## 2016-08-09 DIAGNOSIS — Z1231 Encounter for screening mammogram for malignant neoplasm of breast: Secondary | ICD-10-CM | POA: Diagnosis not present

## 2016-08-10 ENCOUNTER — Encounter: Payer: Self-pay | Admitting: Family Medicine

## 2016-08-23 ENCOUNTER — Other Ambulatory Visit: Payer: Self-pay | Admitting: Family Medicine

## 2016-09-11 ENCOUNTER — Other Ambulatory Visit: Payer: Self-pay | Admitting: *Deleted

## 2016-09-11 MED ORDER — OXYCODONE HCL 5 MG PO TABS: ORAL_TABLET | ORAL | 0 refills | Status: DC

## 2016-09-11 NOTE — Telephone Encounter (Signed)
Patient left a voicemail requesting refill on Oxycodone Last refill 08/08/16 #120 Last office visit 06/28/16

## 2016-09-11 NOTE — Telephone Encounter (Signed)
Indication for chronic opioid: chronic DDD/DJD/RA Medication and dose: oxycodone 5 mg - 2 tabs every 12 hours # pills per month: 120 Last UDS date: 07/12/16 Pain contract signed (Y/N): Y Date narcotic database last reviewed (include red flags): 09/11/16

## 2016-09-28 ENCOUNTER — Telehealth: Payer: Self-pay | Admitting: Family Medicine

## 2016-09-28 DIAGNOSIS — Z1283 Encounter for screening for malignant neoplasm of skin: Secondary | ICD-10-CM

## 2016-09-28 NOTE — Telephone Encounter (Signed)
Pt would like referral/recomendation for a dermatologist in [place]. She has seen one in [place], but her retired, and she does not like any of the remaining providers. She is asking for one that she can get into and be seen before the end of the year. Please call [phone]

## 2016-09-28 NOTE — Telephone Encounter (Signed)
Referral placed.

## 2016-10-09 ENCOUNTER — Telehealth: Payer: Self-pay | Admitting: *Deleted

## 2016-10-09 NOTE — Telephone Encounter (Signed)
Okay to print out and place on my desk for signature. 

## 2016-10-09 NOTE — Telephone Encounter (Signed)
Patient left a voicemail requesting a refill on Oxycodone. Last refill 09/11/16 #120 Last office visit 06/28/16

## 2016-10-11 MED ORDER — OXYCODONE HCL 5 MG PO TABS: ORAL_TABLET | ORAL | 0 refills | Status: DC

## 2016-10-11 NOTE — Telephone Encounter (Signed)
I tried printing from the computer I was using on Dr Synthia Innocent side and it will not print. Sorry!

## 2016-10-11 NOTE — Telephone Encounter (Signed)
I cannot tell if this had been printed. I will print it now and give it to Dr Deborra Medina to sign tomorrow

## 2016-10-11 NOTE — Telephone Encounter (Signed)
Please sign visit and close encounter when completed.

## 2016-10-12 MED ORDER — OXYCODONE HCL 5 MG PO TABS: ORAL_TABLET | ORAL | 0 refills | Status: DC

## 2016-10-16 NOTE — Telephone Encounter (Signed)
Oh my! I do not know. If I remember correctly, I had [facility] print it out because I was in the lab and it cannot print over there. I will contact the pt to see if she got it.

## 2016-10-16 NOTE — Telephone Encounter (Signed)
Was script printed, signed and given to patient?

## 2016-10-16 NOTE — Telephone Encounter (Signed)
[place] do you know if it was signed and given to patient?

## 2016-10-16 NOTE — Telephone Encounter (Signed)
Spoke to pt. She did come and get the rx.

## 2016-10-17 DIAGNOSIS — M19071 Primary osteoarthritis, right ankle and foot: Secondary | ICD-10-CM | POA: Diagnosis not present

## 2016-10-17 DIAGNOSIS — M79671 Pain in right foot: Secondary | ICD-10-CM | POA: Diagnosis not present

## 2016-10-17 DIAGNOSIS — M19171 Post-traumatic osteoarthritis, right ankle and foot: Secondary | ICD-10-CM | POA: Diagnosis not present

## 2016-10-23 DIAGNOSIS — M1A09X0 Idiopathic chronic gout, multiple sites, without tophus (tophi): Secondary | ICD-10-CM | POA: Diagnosis not present

## 2016-10-23 DIAGNOSIS — M797 Fibromyalgia: Secondary | ICD-10-CM | POA: Diagnosis not present

## 2016-10-23 DIAGNOSIS — M0589 Other rheumatoid arthritis with rheumatoid factor of multiple sites: Secondary | ICD-10-CM | POA: Diagnosis not present

## 2016-10-23 DIAGNOSIS — M150 Primary generalized (osteo)arthritis: Secondary | ICD-10-CM | POA: Diagnosis not present

## 2016-11-07 DIAGNOSIS — M19171 Post-traumatic osteoarthritis, right ankle and foot: Secondary | ICD-10-CM | POA: Diagnosis not present

## 2016-11-07 DIAGNOSIS — M19071 Primary osteoarthritis, right ankle and foot: Secondary | ICD-10-CM | POA: Diagnosis not present

## 2016-11-09 ENCOUNTER — Other Ambulatory Visit: Payer: Self-pay

## 2016-11-09 MED ORDER — OXYCODONE HCL 5 MG PO TABS: ORAL_TABLET | ORAL | 0 refills | Status: DC

## 2016-11-09 NOTE — Telephone Encounter (Signed)
Pt left v/m requesting oxycodone rx. Call when ready for pick up. Last printed # 120 on 10/12/16. Pt last seen 12/14/15 for annual but has acute visits this yr.

## 2016-11-09 NOTE — Telephone Encounter (Signed)
Tina Pacheco notified that her prescription is ready to be picked up at the front desk.

## 2016-11-12 ENCOUNTER — Other Ambulatory Visit: Payer: Self-pay | Admitting: Family Medicine

## 2016-11-16 ENCOUNTER — Ambulatory Visit (INDEPENDENT_AMBULATORY_CARE_PROVIDER_SITE_OTHER): Payer: Medicare Other | Admitting: Family Medicine

## 2016-11-16 VITALS — BP 110/70 | HR 52 | Ht 63.0 in | Wt 289.0 lb

## 2016-11-16 DIAGNOSIS — M1990 Unspecified osteoarthritis, unspecified site: Secondary | ICD-10-CM

## 2016-11-16 DIAGNOSIS — Z23 Encounter for immunization: Secondary | ICD-10-CM | POA: Diagnosis not present

## 2016-11-16 DIAGNOSIS — M47812 Spondylosis without myelopathy or radiculopathy, cervical region: Secondary | ICD-10-CM

## 2016-11-16 MED ORDER — MONTELUKAST SODIUM 10 MG PO TABS: 10.0000 mg | ORAL_TABLET | Freq: Every day | ORAL | 3 refills | Status: DC

## 2016-11-16 MED ORDER — METOPROLOL SUCCINATE ER 50 MG PO TB24: 50.0000 mg | ORAL_TABLET | Freq: Every day | ORAL | 3 refills | Status: DC

## 2016-11-16 NOTE — Progress Notes (Signed)
Subjective:   Patient ID: Tina Pacheco, female    DOB: 02/10/1951, 66 y.o.   MRN: [id number]  Tina Pacheco is a pleasant 66 y.o. year old female who presents to clinic today with Follow-up  on 11/16/2016  HPI:   Chronic pain- known DDD of spine, OA of both knees and feet and RA.  Followed by rheum, podiatry and ortho.  Right foot pain- still in a boot. Has received cortisone injections about 4 weeks ago in her right foot.  Did not seem to help much. Last saw podiatry (Dr. Elvina Mattes) two weeks ago.  Taking oxycodone.  Also receiving flexogenic injections in her knees.  Indication for chronic opioid:DDD of spine, OA Medication and dose: oxycodone 5 mg - 2 tab every 12 hours as needed # pills per month: 120 Last UDS date: 07/12/16 Pain contract signed (Y/N): Y Date narcotic database last reviewed (include red flags): 11/16/16   Rheum feels pain is from OA and not RA.  Has been working on her weight- joined [employer] and has lost 58 pounds. Wt Readings from Last 3 Encounters:  11/16/16 289 lb (131.1 kg)  06/28/16 (!) 344 lb 6.4 oz (156.2 kg)  04/25/16 (!) 331 lb 12.8 oz (150.5 kg)     Current Outpatient Prescriptions on File Prior to Visit  Medication Sig Dispense Refill  . albuterol (PROVENTIL HFA;VENTOLIN HFA) 108 (90 Base) MCG/ACT inhaler Inhale 2 puffs into the lungs every 6 (six) hours as needed. 1 Inhaler 0  . amLODipine (NORVASC) 5 MG tablet take 1 tablet by mouth once daily 90 tablet 0  . atorvastatin (LIPITOR) 20 MG tablet Take 1 tablet (20 mg total) by mouth daily. 90 tablet 3  . chlorpheniramine (CHLOR-TRIMETON) 4 MG tablet Take 4 mg by mouth every 4 (four) hours as needed (for drippy nose, drainage, and throat clearing).    . Febuxostat (ULORIC PO) Take 40 mg by mouth every morning.     . fluticasone (FLONASE) 50 MCG/ACT nasal spray Place 2 sprays into both nostrils daily as needed (nasal stuffiness).    . gabapentin (NEURONTIN) 300 MG capsule take 1  capsule by mouth three times a day TAKE 1 CAPSULE 2-3 HOURS PRIOR TO BEDTIME 90 capsule 3  . hydroxychloroquine (PLAQUENIL) 200 MG tablet Take by mouth 2 (two) times daily.    Marland Kitchen KLOR-CON M20 20 MEQ tablet TAKE 1/2 TABLET (=10MEQ)   TWO TIMES A DAY ; MAY TAKE AND EXTRA TABLET WHEN      TAKING TORSEMIDE 180 tablet 3  . leflunomide (ARAVA) 20 MG tablet Take 20 mg by mouth every morning.     . Multiple Vitamin (MULTIVITAMIN) tablet Take 1 tablet by mouth every morning.     Marland Kitchen oxyCODONE (ROXICODONE) 5 MG immediate release tablet Take 2 tablets by mouth every 12 hours 120 tablet 0  . PRADAXA 150 MG CAPS capsule TAKE 1 CAPSULE TWICE DAILY 180 capsule 3  . pregabalin (LYRICA) 150 MG capsule Take 150 mg by mouth 3 (three) times daily.    Marland Kitchen terazosin (HYTRIN) 10 MG capsule take 1 capsule by mouth at bedtime 90 capsule 0  . tolterodine (DETROL LA) 2 MG 24 hr capsule Take 1 capsule (2 mg total) by mouth daily. 303 capsule 3  . torsemide (DEMADEX) 20 MG tablet take 1 tablet by mouth twice a day for edema 60 tablet 3   No current facility-administered medications on file prior to visit.     No Known Allergies  Past  Medical History:  Diagnosis Date  . Asthma   . Basal cell carcinoma   . COPD (chronic obstructive pulmonary disease) ([place])   . Diastolic dysfunction   . Diastolic dysfunction    a. echo 07/2014: EF 55-60%, no RWMA, GR2DD, mild MR, LA moderately dilated, PASP 38 mm Hg  . Dyslipidemia   . History of cardiac cath    a. 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 History of gout   . Hyperplastic colonic polyp 2003  . Hypertension   . Hypokalemia   . Morbid obesity ([place])   . PAF (paroxysmal atrial fibrillation) (HCC)    a. on Pradaxa; b. CHADSVASc at least 2 (HTN & female)  . Rheumatoid arthritis(714.0)   . Sleep apnea    a. not compliant with CPAP    Past Surgical History:  Procedure Laterality Date  . CARDIAC CATHETERIZATION  05/24/2010   nonobstructive CAD  . CESAREAN SECTION     . CHOLECYSTECTOMY    . COLONOSCOPY  06/12/2011   Procedure: COLONOSCOPY;  Surgeon: Juanita Craver, MD;  Location: [facility];  Service: Endoscopy;  Laterality: N/A;  . COLONOSCOPY N/A 03/17/2013   Procedure: COLONOSCOPY;  Surgeon: Juanita Craver, MD;  Location: [facility];  Service: Endoscopy;  Laterality: N/A;  . EYE SURGERY    . KNEE ARTHROSCOPY     bilateral  . VAGINAL HYSTERECTOMY      Family History  Problem Relation Age of Onset  . Emphysema Father        smoked  . Tuberculosis Mother   . Parkinsonism Mother   . Diabetes type II Sister   . Breast cancer Sister   . Breast cancer Maternal Aunt   . Tuberculosis Sister     Social History   Social History  . Marital status: Married    Spouse name: N/A  . Number of children: N/A  . Years of education: N/A   Occupational History  . Retired    Social History Main Topics  . Smoking status: Never Smoker  . Smokeless tobacco: Never Used  . Alcohol use No  . Drug use: No  . Sexual activity: Not on file   Other Topics Concern  . Not on file   Social History Narrative   Desires CPR   The PMH, PSH, Social History, Family History, Medications, and allergies have been reviewed in [facility], and have been updated if relevant.   Review of Systems  Psychiatric/Behavioral: Negative for agitation, behavioral problems, confusion, decreased concentration, dysphoric mood, hallucinations, self-injury, sleep disturbance and suicidal ideas. The patient is not nervous/anxious and is not hyperactive.   All other systems reviewed and are negative.      Objective:    BP 110/70   Pulse (!) 52   Ht 5\' 3"  (1.6 m)   Wt 289 lb (131.1 kg)   SpO2 97%   BMI 51.19 kg/m  Wt Readings from Last 3 Encounters:  11/16/16 289 lb (131.1 kg)  06/28/16 (!) 344 lb 6.4 oz (156.2 kg)  04/25/16 (!) 331 lb 12.8 oz (150.5 kg)     Physical Exam  Constitutional: She is oriented to person, place, and time. No distress.  obese  HENT:  Head:  Normocephalic.  Eyes: Conjunctivae are normal.  Cardiovascular: Normal rate.   Pulmonary/Chest: Effort normal.  Musculoskeletal: Normal range of motion.  Wearing boot on right foot  Neurological: She is alert and oriented to person, place, and time. No cranial nerve deficit.  Skin: Skin is warm and dry.  She is not diaphoretic.  Psychiatric: She has a normal mood and affect. Her behavior is normal. Thought content normal.  Nursing note and vitals reviewed.         Assessment & Plan:   Need for immunization against influenza - Plan: Flu Vaccine QUAD 6+ mos PF IM (Fluarix Quad PF)  Osteoarthritis, unspecified osteoarthritis type, unspecified site  Osteoarthritis of cervical spine, unspecified spinal osteoarthritis complication status  Severe obesity (BMI >= 40) (HCC) No Follow-up on file.

## 2016-11-16 NOTE — Assessment & Plan Note (Signed)
>  25 minutes spent in face to face time with patient, >50% spent in counselling or coordination of care discussing OA- knees, feet, back. Unfortunately despite losing almost 60 pounds, OA is still quite significant in terms of her discomfort. Continue working with podiatry for injections. She remains in a boot. She does take narcotics for this as well.

## 2016-11-23 ENCOUNTER — Other Ambulatory Visit: Payer: Self-pay | Admitting: Family Medicine

## 2016-11-28 ENCOUNTER — Telehealth: Payer: Self-pay | Admitting: Family Medicine

## 2016-11-28 DIAGNOSIS — M19071 Primary osteoarthritis, right ankle and foot: Secondary | ICD-10-CM | POA: Diagnosis not present

## 2016-11-28 DIAGNOSIS — M19171 Post-traumatic osteoarthritis, right ankle and foot: Secondary | ICD-10-CM | POA: Diagnosis not present

## 2016-11-28 NOTE — Telephone Encounter (Signed)
We try to avoid NSAIDs in patients on anti coagulants but will route to Dr. Rockey Situ, the prescribing MD,  for his advice.

## 2016-11-28 NOTE — Telephone Encounter (Signed)
Per Maudie Mercury, Dr. Elvina Mattes saw this patient today and wants to put her on Celebrex.  200mg  1x/day with food.  They noticed she is on Tradaxa and wants to clear the Celebrex with Dr. Deborra Medina.  Please call them back at [phone]

## 2016-11-28 NOTE — Telephone Encounter (Signed)
Yes, would try to avoid NSAIDS if possible thx TGollan

## 2016-12-04 ENCOUNTER — Other Ambulatory Visit: Payer: Self-pay | Admitting: Family Medicine

## 2016-12-04 MED ORDER — OXYCODONE HCL 5 MG PO TABS: ORAL_TABLET | ORAL | 0 refills | Status: DC

## 2016-12-04 NOTE — Telephone Encounter (Signed)
Patient notified prescription ready for pick up. Rx at front desk.

## 2016-12-04 NOTE — Telephone Encounter (Signed)
Pt left v/m requesting rx oxycodone. Call when ready for pick up. Last printed # 120 on 11/09/16; pt last seen 11/16/16.

## 2016-12-05 ENCOUNTER — Other Ambulatory Visit: Payer: Self-pay | Admitting: Family Medicine

## 2016-12-14 NOTE — Progress Notes (Signed)
Cardiology Office Note  Date:  12/15/2016   ID:  Pacheco, Tina 09-28-1950, MRN [id number]  PCP:  Lucille Passy, MD   Chief Complaint  Patient presents with  . other    12 month follow up. Meds reviewed by the pt. verbally. Pt. c/o shortness of breath with over exertion.     HPI:  Tina Pacheco is a 66 year old woman with  morbid obesity,  May 23 2010  paroxysmal atrial fibrillation,  echocardiogram showing normal LV function with diastolic dysfunction,  hyperlipidemia, hypertension,  cardiac catheterization May 24 2010 mild nonobstructive coronary artery disease,  asthma/COPD,  suspected sleep apnea,  atrial fibrillation March and July 2012, October 2013.(no symptoms) started on diltiazem infusion and converted to normal sinus rhythm  rheumatoid arthritis  who presents for routine followup of her atrial fibrillation.  Boot on the right foot, "broken bone, arthritis in foot"  Weight trending down, 60 pounds and she is following weight watchers Taking torsemide periodically for leg swelling She does some exercise with her recumbent bike  Denies any tachycardia concerning for atrial fibrillation but she was symptomatic in the past Tolerating anticoagulation  Previous Echo in 2016 with EF 42%, diastolic dysfunction, results reviewed with her  In 2016, no PAD in legs  EKG on today's visit shows normal sinus rhythm with rate 54 bpm, no significant ST or T-wave changes  Other past medical history reviewed Previously, Lisinopril was changed to Norvasc for cough.    fibromyalgia and rheumatoid arthritis  Family previously indicated that they thought that she has obstructive sleep apnea. She does not think that she can sleep with a CPAP. All of her atrial fibrillation episodes have occurred first thing in the morning prior to medications.   lab work showing total cholesterol 114, LDL 55  PMH:   has a past medical history of Asthma; Basal cell carcinoma;  COPD (chronic obstructive pulmonary disease) ([place]); Diastolic dysfunction; Diastolic dysfunction; Dyslipidemia; History of cardiac cath; History of gout; Hyperplastic colonic polyp (2003); Hypertension; Hypokalemia; Morbid obesity ([place]); PAF (paroxysmal atrial fibrillation) ([place]); Rheumatoid arthritis(714.0); and Sleep apnea.  PSH:    Past Surgical History:  Procedure Laterality Date  . CARDIAC CATHETERIZATION  05/24/2010   nonobstructive CAD  . CESAREAN SECTION    . CHOLECYSTECTOMY    . COLONOSCOPY  06/12/2011   Procedure: COLONOSCOPY;  Surgeon: Juanita Craver, MD;  Location: [facility];  Service: Endoscopy;  Laterality: N/A;  . COLONOSCOPY N/A 03/17/2013   Procedure: COLONOSCOPY;  Surgeon: Juanita Craver, MD;  Location: [facility];  Service: Endoscopy;  Laterality: N/A;  . EYE SURGERY    . KNEE ARTHROSCOPY     bilateral  . VAGINAL HYSTERECTOMY      Current Outpatient Prescriptions  Medication Sig Dispense Refill  . amLODipine (NORVASC) 5 MG tablet take 1 tablet by mouth once daily 90 tablet 1  . atorvastatin (LIPITOR) 20 MG tablet Take 1 tablet (20 mg total) by mouth daily. 90 tablet 3  . Febuxostat (ULORIC PO) Take 40 mg by mouth every morning.     . fluticasone (FLONASE) 50 MCG/ACT nasal spray Place 2 sprays into both nostrils daily as needed (nasal stuffiness).    . gabapentin (NEURONTIN) 300 MG capsule take 1 capsule by mouth three times a day *TAKE 1 CAPSULE 2-3 HOURS BEFORE BEDTIME 90 capsule 3  . hydroxychloroquine (PLAQUENIL) 200 MG tablet Take by mouth 2 (two) times daily.    Marland Kitchen KLOR-CON M20 20 MEQ tablet TAKE 1/2 TABLET (=10MEQ)  TWO TIMES A DAY ; MAY TAKE AND EXTRA TABLET WHEN      TAKING TORSEMIDE 180 tablet 3  . leflunomide (ARAVA) 20 MG tablet Take 20 mg by mouth every morning.     . loratadine (CLARITIN) 10 MG tablet Take 10 mg by mouth daily.    . metoprolol succinate (TOPROL-XL) 50 MG 24 hr tablet Take 1 tablet (50 mg total) by mouth daily. Take with or immediately  following a meal. 90 tablet 3  . montelukast (SINGULAIR) 10 MG tablet Take 1 tablet (10 mg total) by mouth at bedtime. 90 tablet 3  . Multiple Vitamin (MULTIVITAMIN) tablet Take 1 tablet by mouth every morning.     Marland Kitchen oxyCODONE (ROXICODONE) 5 MG immediate release tablet Take 2 tablets by mouth every 12 hours 120 tablet 0  . PRADAXA 150 MG CAPS capsule TAKE 1 CAPSULE TWICE DAILY 180 capsule 3  . pregabalin (LYRICA) 150 MG capsule Take 150 mg by mouth 3 (three) times daily.    Marland Kitchen terazosin (HYTRIN) 10 MG capsule take 1 capsule by mouth at bedtime 90 capsule 0  . tolterodine (DETROL LA) 2 MG 24 hr capsule Take 1 capsule (2 mg total) by mouth daily. 303 capsule 3  . torsemide (DEMADEX) 20 MG tablet take 1 tablet by mouth twice a day for edema 60 tablet 3   No current facility-administered medications for this visit.      Allergies:   Patient has no known allergies.   Social History:  The patient  reports that she has never smoked. She has never used smokeless tobacco. She reports that she does not drink alcohol or use drugs.   Family History:   family history includes Breast cancer in her maternal aunt and sister; Diabetes type II in her sister; Emphysema in her father; Parkinsonism in her mother; Tuberculosis in her mother and sister.    Review of Systems: Review of Systems  Constitutional: Negative.   Respiratory: Negative.   Cardiovascular: Negative.   Gastrointestinal: Negative.   Musculoskeletal: Negative.   Neurological: Negative.   Psychiatric/Behavioral: Negative.   All other systems reviewed and are negative.    PHYSICAL EXAM: VS:  BP 120/70 (BP Location: Left Wrist, Patient Position: Sitting, Cuff Size: Normal)   Pulse (!) 54   Ht 5\' 3"  (1.6 m)   Wt 287 lb (130.2 kg)   BMI 50.84 kg/m  , BMI Body mass index is 50.84 kg/m.  GEN: Well nourished, well developed, in no acute distress, obese  HEENT: normal  Neck: no JVD, carotid bruits, or masses Cardiac: RRR; no murmurs,  rubs, or gallops,no edema  Respiratory:  clear to auscultation bilaterally, normal work of breathing GI: soft, nontender, nondistended, + BS MS: no deformity or atrophy , boot in place on the right ankle/foot Skin: warm and dry, no rash Neuro:  Strength and sensation are intact Psych: euthymic mood, full affect    Recent Labs: No results found for requested labs within last 8760 hours.    Lipid Panel Lab Results  Component Value Date   CHOL 152 12/14/2015   HDL 37.60 (L) 12/14/2015   LDLCALC 95 12/14/2015   TRIG 98.0 12/14/2015      Wt Readings from Last 3 Encounters:  12/15/16 287 lb (130.2 kg)  11/16/16 289 lb (131.1 kg)  06/28/16 (!) 344 lb 6.4 oz (156.2 kg)       ASSESSMENT AND PLAN:  PAF (paroxysmal atrial fibrillation) (HCC) - Plan: EKG 12-Lead Maintaining normal sinus rhythm. No  symptoms concerning for atrial fibrillation Tolerating anticoagulation, stable  Essential hypertension - Plan: EKG 12-Lead Blood pressure is well controlled on today's visit. No changes made to the medications. Stable.  Recommend she monitor her blood pressure as weight decreases as medications may need to be adjusted downward  Hyperlipidemia Cholesterol is at goal on the current lipid regimen. No changes to the medications were made. Stable, likely improving with weight loss  Coronary artery disease, non-occlusive Currently with no symptoms of angina. No further workup at this time. Continue current medication regimen.  Stable, no further testing ordered  Morbid obesity, unspecified obesity type ([place]) Limited in her ability to exercise secondary to arthritis.  Dramatic weight loss with weight watchers.  Congratulated her  Bilateral leg edema Leg edema much improved with weight loss, periodic torsemide  Chronic diastolic CHF Taking torsemide as needed for leg swelling and abdominal bloating Likely will have less issues as weight is down 60 pounds Diet much better  controlled   Total encounter time more than 25 minutes  Greater than 50% was spent in counseling and coordination of care with the patient   Disposition:   F/U  12 months   Orders Placed This Encounter  Procedures  . EKG 12-Lead     Signed, Esmond Plants, M.D., Ph.D. 12/15/2016  [place], [place]

## 2016-12-15 ENCOUNTER — Encounter: Payer: Self-pay | Admitting: Cardiovascular Disease

## 2016-12-15 ENCOUNTER — Ambulatory Visit (INDEPENDENT_AMBULATORY_CARE_PROVIDER_SITE_OTHER): Payer: Medicare Other | Admitting: Cardiovascular Disease

## 2016-12-15 VITALS — BP 120/70 | HR 54 | Ht 63.0 in | Wt 287.0 lb

## 2016-12-15 DIAGNOSIS — M069 Rheumatoid arthritis, unspecified: Secondary | ICD-10-CM | POA: Diagnosis not present

## 2016-12-15 DIAGNOSIS — Z7901 Long term (current) use of anticoagulants: Secondary | ICD-10-CM | POA: Diagnosis not present

## 2016-12-15 DIAGNOSIS — I480 Paroxysmal atrial fibrillation: Secondary | ICD-10-CM

## 2016-12-15 DIAGNOSIS — E782 Mixed hyperlipidemia: Secondary | ICD-10-CM

## 2016-12-15 DIAGNOSIS — I2510 Atherosclerotic heart disease of native coronary artery without angina pectoris: Secondary | ICD-10-CM

## 2016-12-15 DIAGNOSIS — I10 Essential (primary) hypertension: Secondary | ICD-10-CM

## 2016-12-15 NOTE — Patient Instructions (Signed)

## 2016-12-28 HISTORY — PX: TOOTH EXTRACTION: SUR596

## 2016-12-30 ENCOUNTER — Other Ambulatory Visit: Payer: Self-pay | Admitting: Family Medicine

## 2017-01-01 DIAGNOSIS — M19171 Post-traumatic osteoarthritis, right ankle and foot: Secondary | ICD-10-CM | POA: Diagnosis not present

## 2017-01-01 DIAGNOSIS — M19071 Primary osteoarthritis, right ankle and foot: Secondary | ICD-10-CM | POA: Diagnosis not present

## 2017-01-03 ENCOUNTER — Other Ambulatory Visit: Payer: Self-pay | Admitting: *Deleted

## 2017-01-03 NOTE — Telephone Encounter (Signed)
Last Rx 12/04/2016. Last OV 11/16/2016

## 2017-01-03 NOTE — Telephone Encounter (Signed)
Copied from [place] [id number]. Topic: Inquiry >> Jan 03, 2017  1:03 PM Pricilla Handler wrote: Reason for CRM: Patient needs a prescription for Oxycodone.

## 2017-01-04 MED ORDER — OXYCODONE HCL 5 MG PO TABS: ORAL_TABLET | ORAL | 0 refills | Status: DC

## 2017-01-04 NOTE — Telephone Encounter (Signed)
Pt aware Rx is ready at the front/thx dmf

## 2017-01-09 DIAGNOSIS — D692 Other nonthrombocytopenic purpura: Secondary | ICD-10-CM | POA: Diagnosis not present

## 2017-01-09 DIAGNOSIS — L578 Other skin changes due to chronic exposure to nonionizing radiation: Secondary | ICD-10-CM | POA: Diagnosis not present

## 2017-01-09 DIAGNOSIS — D229 Melanocytic nevi, unspecified: Secondary | ICD-10-CM | POA: Diagnosis not present

## 2017-01-09 DIAGNOSIS — L820 Inflamed seborrheic keratosis: Secondary | ICD-10-CM | POA: Diagnosis not present

## 2017-01-09 DIAGNOSIS — D1800 Hemangioma unspecified site: Secondary | ICD-10-CM | POA: Diagnosis not present

## 2017-01-09 DIAGNOSIS — Z1283 Encounter for screening for malignant neoplasm of skin: Secondary | ICD-10-CM | POA: Diagnosis not present

## 2017-01-09 DIAGNOSIS — L905 Scar conditions and fibrosis of skin: Secondary | ICD-10-CM | POA: Diagnosis not present

## 2017-01-09 DIAGNOSIS — L814 Other melanin hyperpigmentation: Secondary | ICD-10-CM | POA: Diagnosis not present

## 2017-01-09 DIAGNOSIS — L821 Other seborrheic keratosis: Secondary | ICD-10-CM | POA: Diagnosis not present

## 2017-01-28 ENCOUNTER — Other Ambulatory Visit: Payer: Self-pay | Admitting: Family Medicine

## 2017-01-28 ENCOUNTER — Other Ambulatory Visit: Payer: Self-pay | Admitting: Cardiovascular Disease

## 2017-01-29 ENCOUNTER — Telehealth: Payer: Self-pay | Admitting: Family Medicine

## 2017-01-29 MED ORDER — OXYCODONE HCL 5 MG PO TABS: ORAL_TABLET | ORAL | 0 refills | Status: DC

## 2017-01-29 NOTE — Telephone Encounter (Signed)
Copied from [place] [phone]. Topic: Quick Communication - See Telephone Encounter >> Jan 29, 2017  3:39 PM Cleaster Corin, NT wrote: CRM for notification. See Telephone encounter for:   01/29/17. Pt. Calling would like to have rx. Refill on oxycodone please give pt. A call with any questions [phone]

## 2017-01-29 NOTE — Telephone Encounter (Signed)
Printed and placed in TA's box for approval/will advise pt when ready/thx dmf

## 2017-01-30 ENCOUNTER — Other Ambulatory Visit: Payer: Self-pay | Admitting: Family Medicine

## 2017-01-30 MED ORDER — OXYCODONE HCL 5 MG PO TABS: ORAL_TABLET | ORAL | 0 refills | Status: DC

## 2017-01-30 NOTE — Telephone Encounter (Signed)
Leafy Ro, what is the policy on this?

## 2017-01-30 NOTE — Telephone Encounter (Signed)
Copied from [place] [id number]. Topic: Quick Communication - See Telephone Encounter >> Jan 29, 2017  3:39 PM Cleaster Corin, NT wrote: CRM for notification. See Telephone encounter for:   01/29/17. Pt. Calling would like to have rx. Refill on oxycodone please give pt. A call with any questions [phone] >> Jan 30, 2017  9:10 AM Sandi Mariscal E, NT wrote: Pt. Called back about picking up a prescription for on oxyCODONE (ROXICODONE) 5 MG immediate release tablet. Pt said that Dr. Deborra Medina had a prescription for her to pick up and didn't want to drive to [facility] so they shredded it and told her to contact the  [facility] Pt has a new patient appointment with Webb Silversmith on 12/20 but wanted to see if she can get it before appointment. She would like a call back.

## 2017-01-30 NOTE — Telephone Encounter (Signed)
Spoke to pt and advised per [facility], she will need to obtain Rx from Dr Hulen Shouts office at [facility]. Pt also advised that Rollene Fare will not continue to provide her with oxycodone, and she would be referred to the pain clinic should she decide to stay a pt at [facility]. Pt states she will contact office back and advise if she will be going to [place] or stay at [facility] before her next Rx is due, next month. Rx request forwarded back to Dr Deborra Medina

## 2017-01-30 NOTE — Addendum Note (Signed)
Addended by: Modena Nunnery on: 01/30/2017 09:44 AM   Modules accepted: Orders

## 2017-01-30 NOTE — Telephone Encounter (Signed)
I called pt to advise that her Rx is ready/she was very upset that she had called and told the [facility] office that she was remaining there/she cannot come to [place] to P/U this Rx so I have now shredded this although it has been approved  She wants someone from [facility] to fill the Rx of Oxycodone for her as she cannot drive this far/I advised that she go ahead and schedule an appointment to establish care with a new provider as she stated that she fully intends to  Please someone take care of this medication for her and call her when ready to P/U/thx dmf

## 2017-01-31 ENCOUNTER — Other Ambulatory Visit: Payer: Self-pay

## 2017-01-31 MED ORDER — OXYCODONE HCL 5 MG PO TABS: ORAL_TABLET | ORAL | 0 refills | Status: DC

## 2017-01-31 NOTE — Telephone Encounter (Addendum)
Pt is upset that R Baity NP is going to require her to go to pain clinic; Leafy Ro RN said pt might could establish with one of the physicians instead of NP; Jeani Hawking front office mgr will discuss taking as NP and will contact pt after reviews with Dr Silvio Pate; pt also understands she will need to pick up oxycodone rx at [facility] from Dr Deborra Medina until establishes with another provider. Ronnie notified at [place] [facility] CMA was with pt) and office note sent to [place], [place].

## 2017-01-31 NOTE — Telephone Encounter (Signed)
Printed Oxycodone for Dec, Jan, Feb till pt can be seen at [facility] and they are wanting to get her to a pain clinic/TA signed/Pt aware/thx dmf

## 2017-01-31 NOTE — Telephone Encounter (Signed)
We [facility])  have spoken with patient and explained that patient will need to have someone pick up her R/X this last time from [place] by Dr. Deborra Medina until she establishes with another one of our providers here.  We are working to get her in by the end of her next R/X with a physician who will continue to manage her pain management needs at that point.  We apologize for the back and forth that she has received but explained that it is our policy for the pcp responsible to manage the narcotics until the transfer of care has been made.  We cannot have multiple physicians prescribing the same narcotic for the patient.  Dr. Deborra Medina is aware of this protocol and had already agreed to continue filling her patient's narcotics X 1 month after her departure to allow time for the patients to re-establish elsewhere.    Patient verbalizes understanding and does have a family member who will go to [place] today to pick up the written R/X from Dr. Deborra Medina.  She just wanted to be sure that a provider here at [place] can take care of moving forward.  She understands that she will need to establish with one of our Doctors to continue pain management.

## 2017-01-31 NOTE — Telephone Encounter (Signed)
PEC called this office and spoke to [place] was asking for the Oxycodone to be called in/Monica came to me to discuss and I informed her that the pt refused to come to [place] office to P/U Rx and wanted someone at the [facility] office to fill this as she intends to stay with that office/The PEC lady told [employer] are the only office that refuses to call in a prescription." Monica reiterated the information above and advised that this is a medication that MUST be picked up/the [facility] employee hung up on Monica/thx dmf

## 2017-02-11 ENCOUNTER — Other Ambulatory Visit: Payer: Self-pay | Admitting: Family Medicine

## 2017-02-15 ENCOUNTER — Ambulatory Visit (INDEPENDENT_AMBULATORY_CARE_PROVIDER_SITE_OTHER): Payer: Medicare Other

## 2017-02-15 ENCOUNTER — Ambulatory Visit: Payer: Federal, State, Local not specified - PPO | Admitting: Internal Medicine

## 2017-02-15 VITALS — BP 116/60 | HR 49 | Temp 97.9°F | Ht 63.0 in | Wt 293.2 lb

## 2017-02-15 DIAGNOSIS — Z0000 Encounter for general adult medical examination without abnormal findings: Secondary | ICD-10-CM

## 2017-02-15 NOTE — Progress Notes (Signed)
   Subjective:    Patient ID: Tina Pacheco, female    DOB: 1951/02/18, 66 y.o.   MRN: [id number]  HPI I reviewed health advisor's note, was available for consultation, and agree with documentation and plan.    Review of Systems     Objective:   Physical Exam        Assessment & Plan:

## 2017-02-15 NOTE — Progress Notes (Signed)
Subjective:   Tina Pacheco is a 66 y.o. female who presents for Medicare Annual (Subsequent) preventive examination.  Review of Systems:  N/A Cardiac Risk Factors include: advanced age (>71men, >33 women);obesity (BMI >30kg/m2);dyslipidemia;hypertension     Objective:     Vitals: BP 116/60 (BP Location: Right Arm, Patient Position: Sitting, Cuff Size: Large)   Pulse (!) 49   Temp 97.9 F (36.6 C) (Oral)   Ht 5\' 3"  (1.6 m)   Wt 293 lb 4 oz (133 kg)   SpO2 97%   BMI 51.95 kg/m   Body mass index is 51.95 kg/m.  Advanced Directives 02/15/2017 06/15/2015 08/10/2014 08/09/2014 03/17/2013  Does Patient Have a Medical Advance Directive? No No No No Patient does not have advance directive  Would patient like information on creating a medical advance directive? No - Patient declined - No - patient declined information No - patient declined information -  Pre-existing out of facility DNR order (yellow form or pink MOST form) - - - - No    Tobacco Social History   Tobacco Use  Smoking Status Never Smoker  Smokeless Tobacco Never Used     Counseling given: No   Clinical Intake:  Pre-visit preparation completed: Yes  Pain : 0-10 Pain Score: 5  Pain Type: Chronic pain Pain Location: Foot(knee, leg) Pain Orientation: Right Pain Onset: More than a month ago Pain Frequency: Constant     Nutritional Status: BMI > 30  Obese Nutritional Risks: Other (Comment) Diabetes: No  How often do you need to have someone help you when you read instructions, pamphlets, or other written materials from your doctor or pharmacy?: 1 - Never What is the last grade level you completed in school?: Bachelor of Arts  Interpreter Needed?: No  Comments: pt lives with spouse Information entered by :: LPinson, LPN  Past Medical History:  Diagnosis Date  . Asthma   . Basal cell carcinoma   . Cataract 2018   bilateral eyes; corrected with surgery  . COPD (chronic obstructive pulmonary  disease) ([place])   . Diastolic dysfunction   . Diastolic dysfunction    a. echo 07/2014: EF 55-60%, no RWMA, GR2DD, mild MR, LA moderately dilated, PASP 38 mm Hg  . Dyslipidemia   . History of cardiac cath    a. 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 History of gout   . Hyperplastic colonic polyp 2003  . Hypertension   . Hypokalemia   . Morbid obesity ([place])   . PAF (paroxysmal atrial fibrillation) ([facility])    a. on Pradaxa; b. CHADSVASc at least 2 (HTN & female)  . Rheumatoid arthritis(714.0)   . Sleep apnea    a. not compliant with CPAP   Past Surgical History:  Procedure Laterality Date  . CARDIAC CATHETERIZATION  05/24/2010   nonobstructive CAD  . CATARACT EXTRACTION W/ INTRAOCULAR LENS IMPLANT Right 06/12/2016   Dr. Darleen Crocker  . CATARACT EXTRACTION W/ INTRAOCULAR LENS IMPLANT Left 06/26/2016   Dr. Darleen Crocker  . CESAREAN SECTION    . CHOLECYSTECTOMY    . COLONOSCOPY  06/12/2011   Procedure: COLONOSCOPY;  Surgeon: Juanita Craver, MD;  Location: [facility];  Service: Endoscopy;  Laterality: N/A;  . COLONOSCOPY N/A 03/17/2013   Procedure: COLONOSCOPY;  Surgeon: Juanita Craver, MD;  Location: [facility];  Service: Endoscopy;  Laterality: N/A;  . EYE SURGERY    . KNEE ARTHROSCOPY     bilateral  . TOOTH EXTRACTION  12/2016  . VAGINAL HYSTERECTOMY  Family History  Problem Relation Age of Onset  . Emphysema Father        smoked  . Tuberculosis Mother   . Parkinsonism Mother   . Diabetes type II Sister   . Breast cancer Sister   . Breast cancer Maternal Aunt   . Tuberculosis Sister    Social History   Socioeconomic History  . Marital status: Married    Spouse name: None  . Number of children: None  . Years of education: None  . Highest education level: None  Social Needs  . Financial resource strain: None  . Food insecurity - worry: None  . Food insecurity - inability: None  . Transportation needs - medical: None  . Transportation needs - non-medical:  None  Occupational History  . Occupation: Retired  Tobacco Use  . Smoking status: Never Smoker  . Smokeless tobacco: Never Used  Substance and Sexual Activity  . Alcohol use: No  . Drug use: No  . Sexual activity: None  Other Topics Concern  . None  Social History Narrative   Desires CPR    Outpatient Encounter Medications as of 02/15/2017  Medication Sig  . amLODipine (NORVASC) 5 MG tablet take 1 tablet by mouth once daily  . atorvastatin (LIPITOR) 20 MG tablet TAKE 1 TABLET DAILY. NEEDS FASTING LABS NOW.  . celecoxib (CELEBREX) 200 MG capsule Take 200 mg by mouth daily.  . Febuxostat (ULORIC PO) Take 40 mg by mouth every morning.   . fluticasone (FLONASE) 50 MCG/ACT nasal spray Place 2 sprays into both nostrils daily as needed (nasal stuffiness).  . gabapentin (NEURONTIN) 300 MG capsule take 1 capsule by mouth three times a day *TAKE 1 CAPSULE 2-3 HOURS BEFORE BEDTIME  . hydroxychloroquine (PLAQUENIL) 200 MG tablet Take by mouth 2 (two) times daily.  Marland Kitchen KLOR-CON M20 20 MEQ tablet TAKE 1/2 TABLET (=10MEQ)   TWO TIMES A DAY ; MAY TAKE AND EXTRA TABLET WHEN      TAKING TORSEMIDE  . leflunomide (ARAVA) 20 MG tablet Take 20 mg by mouth every morning.   . loratadine (CLARITIN) 10 MG tablet Take 10 mg by mouth daily.  . metoprolol succinate (TOPROL-XL) 50 MG 24 hr tablet Take 1 tablet (50 mg total) by mouth daily. Take with or immediately following a meal.  . montelukast (SINGULAIR) 10 MG tablet Take 1 tablet (10 mg total) by mouth at bedtime.  . Multiple Vitamin (MULTIVITAMIN) tablet Take 1 tablet by mouth every morning.   Marland Kitchen oxyCODONE (ROXICODONE) 5 MG immediate release tablet Take 2 tablets by mouth every 12 hours  . PRADAXA 150 MG CAPS capsule TAKE 1 CAPSULE TWICE DAILY  . pregabalin (LYRICA) 150 MG capsule Take 150 mg by mouth 3 (three) times daily.  Marland Kitchen terazosin (HYTRIN) 10 MG capsule take 1 capsule by mouth at bedtime  . tolterodine (DETROL LA) 2 MG 24 hr capsule Take 1 capsule (2  mg total) by mouth daily. (Patient taking differently: Take 2 mg by mouth as needed. )  . torsemide (DEMADEX) 20 MG tablet take 1 tablet by mouth twice a day for edema  . [DISCONTINUED] atorvastatin (LIPITOR) 20 MG tablet TAKE 1 TABLET DAILY   No facility-administered encounter medications on file as of 02/15/2017.     Activities of Daily Living In your present state of health, do you have any difficulty performing the following activities: 02/15/2017 11/16/2016  Hearing? N N  Vision? N N  Difficulty concentrating or making decisions? N N  Walking or  climbing stairs? Y N  Dressing or bathing? N N  Doing errands, shopping? N N  Preparing Food and eating ? N -  Using the Toilet? N -  In the past six months, have you accidently leaked urine? Y -  Do you have problems with loss of bowel control? N -  Managing your Medications? N -  Managing your Finances? N -  Housekeeping or managing your Housekeeping? N -  Some recent data might be hidden    Patient Care Team: Lucille Passy, MD as PCP - General (Family Medicine) Tanda Rockers, MD as Consulting Physician (Pulmonary Disease) Minna Merritts, MD as Consulting Physician (Cardiology)    Assessment:   This is a routine wellness examination for [facility].   Hearing Screening   125Hz  250Hz  500Hz  1000Hz  2000Hz  3000Hz  4000Hz  6000Hz  8000Hz   Right ear:   40 40 40  40    Left ear:   40 40 40  0    Vision Screening Comments: Last vision exam in May 2018 with Dr. Maryruth Hancock B.     Exercise Activities and Dietary recommendations Current Exercise Habits: The patient does not participate in regular exercise at present, Exercise limited by: orthopedic condition(s)  Goals    . Diet Management     Starting 02/15/2017, I will continue to follow Weight Watchers program and to attend weekly meetings as scheduled.        Fall Risk Fall Risk  02/15/2017 11/16/2016 12/14/2015  Falls in the past year? Yes Yes No  Comment lost balance; brusing only -  -  Number falls in past yr: 2 or more - -  Injury with Fall? Yes - -  Risk Factor Category  High Fall Risk - -   Depression Screen PHQ 2/9 Scores 02/15/2017 11/16/2016 12/14/2015  PHQ - 2 Score 0 0 0  PHQ- 9 Score 0 - -     Cognitive Function MMSE - Mini Mental State Exam 02/15/2017  Orientation to time 5  Orientation to Place 5  Registration 3  Attention/ Calculation 0  Recall 3  Language- name 2 objects 0  Language- repeat 1  Language- follow 3 step command 3  Language- read & follow direction 0  Write a sentence 0  Copy design 0  Total score 20     PLEASE NOTE: A Mini-Cog screen was completed. Maximum score is 20. A value of 0 denotes this part of Folstein MMSE was not completed or the patient failed this part of the Mini-Cog screening.   Mini-Cog Screening Orientation to Time - Max 5 pts Orientation to Place - Max 5 pts Registration - Max 3 pts Recall - Max 3 pts Language Repeat - Max 1 pts Language Follow 3 Step Command - Max 3 pts     Immunization History  Administered Date(s) Administered  . Influenza Split 11/28/2014  . Influenza,inj,Quad PF,6+ Mos 11/20/2012, 12/14/2015, 11/16/2016  . Influenza-Unspecified 12/11/2013  . Pneumococcal Conjugate-13 06/15/2015  . Pneumococcal Polysaccharide-23 01/05/2014  . Zoster 05/09/2011    Screening Tests Health Maintenance  Topic Date Due  . MAMMOGRAM  08/10/2018  . PNA vac Low Risk Adult (2 of 2 - PPSV23) 01/06/2019  . TETANUS/TDAP  06/17/2019  . COLONOSCOPY  03/18/2023  . INFLUENZA VACCINE  Completed  . DEXA SCAN  Completed  . Hepatitis C Screening  Completed       Plan:     I have personally reviewed, addressed, and noted the following in the patient's chart:  A.  Medical and social history B. Use of alcohol, tobacco or illicit drugs  C. Current medications and supplements D. Functional ability and status E.  Nutritional status F.  Physical activity G. Advance directives H. List of other  physicians I.  Hospitalizations, surgeries, and ER visits in previous 12 months J.  [place] to include hearing, vision, cognitive, depression L. Referrals and appointments - none  In addition, I have reviewed and discussed with patient certain preventive protocols, quality metrics, and best practice recommendations. A written personalized care plan for preventive services as well as general preventive health recommendations were provided to patient.  See attached scanned questionnaire for additional information.   Signed,   Lindell Noe, MHA, BS, LPN Health Coach

## 2017-02-15 NOTE — Progress Notes (Signed)
PCP notes:   Health maintenance:  No gaps identified.  Abnormal screenings:   Fall risk - hx of falls with injury but no medical treatment Hearing - failed  Hearing Screening   125Hz  250Hz  500Hz  1000Hz  2000Hz  3000Hz  4000Hz  6000Hz  8000Hz   Right ear:   40 40 40  40    Left ear:   40 40 40  0    Vision Screening Comments: Last vision exam in May 2018 with Dr. Maryruth Hancock B.   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

## 2017-02-15 NOTE — Patient Instructions (Signed)
Tina Pacheco , Thank you for taking time to come for your Medicare Wellness Visit. I appreciate your ongoing commitment to your health goals. Please review the following plan we discussed and let me know if I can assist you in the future.   These are the goals we discussed: Goals    . Diet Management     Starting 02/15/2017, I will continue to follow Weight Watchers program and to attend weekly meetings as scheduled.        This is a list of the screening recommended for you and due dates:  Health Maintenance  Topic Date Due  . Mammogram  08/10/2018  . Pneumonia vaccines (2 of 2 - PPSV23) 01/06/2019  . Tetanus Vaccine  06/17/2019  . Colon Cancer Screening  03/18/2023  . Flu Shot  Completed  . DEXA scan (bone density measurement)  Completed  .  Hepatitis C: One time screening is recommended by Center for Disease Control  (CDC) for  adults born from [birth year] through 1966.   Completed   Preventive Care for Adults  A healthy lifestyle and preventive care can promote health and wellness. Preventive health guidelines for adults include the following key practices.  . A routine yearly physical is a good way to check with your health care provider about your health and preventive screening. It is a chance to share any concerns and updates on your health and to receive a thorough exam.  . Visit your dentist for a routine exam and preventive care every 6 months. Brush your teeth twice a day and floss once a day. Good oral hygiene prevents tooth decay and gum disease.  . The frequency of eye exams is based on your age, health, family medical history, use  of contact lenses, and other factors. Follow your health care provider's recommendations for frequency of eye exams.  . Eat a healthy diet. Foods like vegetables, fruits, whole grains, low-fat dairy products, and lean protein foods contain the nutrients you need without too many calories. Decrease your intake of foods high in solid fats, added  sugars, and salt. Eat the right amount of calories for you. Get information about a proper diet from your health care provider, if necessary.  . Regular physical exercise is one of the most important things you can do for your health. Most adults should get at least 150 minutes of moderate-intensity exercise (any activity that increases your heart rate and causes you to sweat) each week. In addition, most adults need muscle-strengthening exercises on 2 or more days a week.  Silver Sneakers may be a benefit available to you. To determine eligibility, you may visit the website: www.silversneakers.com or contact program at 804-522-7936 Mon-Fri between 8AM-8PM.   . Maintain a healthy weight. The body mass index (BMI) is a screening tool to identify possible weight problems. It provides an estimate of body fat based on height and weight. Your health care provider can find your BMI and can help you achieve or maintain a healthy weight.   For adults 20 years and older: ? A BMI below 18.5 is considered underweight. ? A BMI of 18.5 to 24.9 is normal. ? A BMI of 25 to 29.9 is considered overweight. ? A BMI of 30 and above is considered obese.   . Maintain normal blood lipids and cholesterol levels by exercising and minimizing your intake of saturated fat. Eat a balanced diet with plenty of fruit and vegetables. Blood tests for lipids and cholesterol should begin at age 45  and be repeated every 5 years. If your lipid or cholesterol levels are high, you are over 50, or you are at high risk for heart disease, you may need your cholesterol levels checked more frequently. Ongoing high lipid and cholesterol levels should be treated with medicines if diet and exercise are not working.  . If you smoke, find out from your health care provider how to quit. If you do not use tobacco, please do not start.  . If you choose to drink alcohol, please do not consume more than 2 drinks per day. One drink is considered to  be 12 ounces (355 mL) of beer, 5 ounces (148 mL) of wine, or 1.5 ounces (44 mL) of liquor.  . If you are 86-43 years old, ask your health care provider if you should take aspirin to prevent strokes.  . Use sunscreen. Apply sunscreen liberally and repeatedly throughout the day. You should seek shade when your shadow is shorter than you. Protect yourself by wearing long sleeves, pants, a wide-brimmed hat, and sunglasses year round, whenever you are outdoors.  . Once a month, do a whole body skin exam, using a mirror to look at the skin on your back. Tell your health care provider of new moles, moles that have irregular borders, moles that are larger than a pencil eraser, or moles that have changed in shape or color.

## 2017-02-15 NOTE — Progress Notes (Signed)
Pre visit review using our clinic review tool, if applicable. No additional management support is needed unless otherwise documented below in the visit note. 

## 2017-03-05 ENCOUNTER — Telehealth: Payer: Self-pay | Admitting: Internal Medicine

## 2017-03-05 DIAGNOSIS — M79671 Pain in right foot: Secondary | ICD-10-CM | POA: Diagnosis not present

## 2017-03-05 DIAGNOSIS — M19171 Post-traumatic osteoarthritis, right ankle and foot: Secondary | ICD-10-CM | POA: Diagnosis not present

## 2017-03-05 MED ORDER — OXYCODONE HCL 5 MG PO TABS: ORAL_TABLET | ORAL | 0 refills | Status: DC

## 2017-03-05 NOTE — Telephone Encounter (Signed)
Copied from [place] [id number]. Topic: Quick Communication - Rx Refill/Question >> Mar 05, 2017  8:04 AM Robina Ade, Helene Kelp D wrote: Has the patient contacted their pharmacy? No (Agent: If no, request that the patient contact the pharmacy for the refill.) Preferred Pharmacy (with phone number or street name):[facility], [place] Agent: Please be advised that RX refills may take up to 3 business days. We ask that you follow-up with your pharmacy. Patient needs refill on her oxyCODONE (ROXICODONE) 5 MG immediate release tablet. Please call patient when it is sent.

## 2017-03-05 NOTE — Telephone Encounter (Signed)
Has she transferred to Dr Silvio Pate? She is an Stage manager pt.

## 2017-03-05 NOTE — Telephone Encounter (Signed)
Spoke to pt. Rx up front ready for pickup 

## 2017-03-05 NOTE — Telephone Encounter (Signed)
Please check CSRS--if no Rx other than here, okay to do Rx for 1 month pending her appt with me

## 2017-03-05 NOTE — Telephone Encounter (Signed)
CSRS printed and given to Dr Silvio Pate

## 2017-03-05 NOTE — Telephone Encounter (Signed)
Requesting refill of Oxycodone  LOV 01/16/17

## 2017-03-05 NOTE — Telephone Encounter (Signed)
Per up coming appointments patient is scheduled to see Dr. Silvio Pate 03/09/17.

## 2017-03-05 NOTE — Telephone Encounter (Signed)
Do you need to see her first before filling oxycodone?

## 2017-03-09 ENCOUNTER — Encounter: Payer: Self-pay | Admitting: Internal Medicine

## 2017-03-09 ENCOUNTER — Ambulatory Visit (INDEPENDENT_AMBULATORY_CARE_PROVIDER_SITE_OTHER): Payer: Medicare Other | Admitting: Internal Medicine

## 2017-03-09 VITALS — BP 132/70 | HR 54 | Temp 97.7°F | Wt 292.0 lb

## 2017-03-09 DIAGNOSIS — I480 Paroxysmal atrial fibrillation: Secondary | ICD-10-CM | POA: Diagnosis not present

## 2017-03-09 DIAGNOSIS — M069 Rheumatoid arthritis, unspecified: Secondary | ICD-10-CM

## 2017-03-09 DIAGNOSIS — J479 Bronchiectasis, uncomplicated: Secondary | ICD-10-CM | POA: Diagnosis not present

## 2017-03-09 DIAGNOSIS — F1120 Opioid dependence, uncomplicated: Secondary | ICD-10-CM

## 2017-03-09 DIAGNOSIS — G894 Chronic pain syndrome: Secondary | ICD-10-CM | POA: Diagnosis not present

## 2017-03-09 NOTE — Assessment & Plan Note (Signed)
Marked weight loss recently with Weight Watchers

## 2017-03-09 NOTE — Progress Notes (Addendum)
Subjective:   Patient ID: Tina Pacheco, female    DOB: 05-23-1950, 67 y.o.   MRN: [id number]  HPI Here to establish care---transfer from Dr Deborra Medina  Currently in [facility] walker for chronic right foot problems May need surgery Not driving for now  Not sleeping well Stopped ambien in 2015 Awakens ~8:15Am but still can't go to sleep till 2AM at the earliest Naps in day at times---minutes to an hour Not able to exercise No striking daytime somnolence  Uses oxycodone regularly For her back, foot and other areas  Has issues with fluid retention Needs diuretic---per Dr Rockey Situ Severe urgency if she has to take 2 of them (for increased retention) detrol stops her up and messes things up--uses only prn (like if going out) Mostly urge incontinence Tries Kegel's --not much help  Rheumatoid arthritis Sees Dr Dossie Der She keeps up with labs  Eye exam due to the plaquenil  Paroxysmal atrial fib On pradaxa No chest pain, SOB or palpitations  CT evidence of bronchiectasis Seen Dr Melvyn Novas in the past No recent problems with this  Current Outpatient Medications on File Prior to Visit  Medication Sig Dispense Refill  . amLODipine (NORVASC) 5 MG tablet take 1 tablet by mouth once daily 90 tablet 1  . atorvastatin (LIPITOR) 20 MG tablet TAKE 1 TABLET DAILY. NEEDS FASTING LABS NOW. 90 tablet 0  . celecoxib (CELEBREX) 200 MG capsule Take 200 mg by mouth daily.  0  . Febuxostat (ULORIC PO) Take 40 mg by mouth every morning.     . fluticasone (FLONASE) 50 MCG/ACT nasal spray Place 2 sprays into both nostrils daily as needed (nasal stuffiness).    . gabapentin (NEURONTIN) 300 MG capsule take 1 capsule by mouth three times a day *TAKE 1 CAPSULE 2-3 HOURS BEFORE BEDTIME 90 capsule 3  . hydroxychloroquine (PLAQUENIL) 200 MG tablet Take by mouth 2 (two) times daily.    Marland Kitchen KLOR-CON M20 20 MEQ tablet TAKE 1/2 TABLET (=10MEQ)   TWO TIMES A DAY ; MAY TAKE AND EXTRA TABLET WHEN      TAKING TORSEMIDE 180 tablet 3   . leflunomide (ARAVA) 20 MG tablet Take 20 mg by mouth every morning.     . loratadine (CLARITIN) 10 MG tablet Take 10 mg by mouth daily.    . metoprolol succinate (TOPROL-XL) 50 MG 24 hr tablet Take 1 tablet (50 mg total) by mouth daily. Take with or immediately following a meal. 90 tablet 3  . montelukast (SINGULAIR) 10 MG tablet Take 1 tablet (10 mg total) by mouth at bedtime. 90 tablet 3  . Multiple Vitamin (MULTIVITAMIN) tablet Take 1 tablet by mouth every morning.     Marland Kitchen oxyCODONE (ROXICODONE) 5 MG immediate release tablet Take 2 tablets by mouth every 12 hours 120 tablet 0  . PRADAXA 150 MG CAPS capsule TAKE 1 CAPSULE TWICE DAILY 180 capsule 3  . pregabalin (LYRICA) 150 MG capsule Take 150 mg by mouth 3 (three) times daily.    Marland Kitchen terazosin (HYTRIN) 10 MG capsule take 1 capsule by mouth at bedtime 90 capsule 0  . tolterodine (DETROL LA) 2 MG 24 hr capsule Take 1 capsule (2 mg total) by mouth daily. (Patient taking differently: Take 2 mg by mouth as needed. ) 303 capsule 3  . torsemide (DEMADEX) 20 MG tablet take 1 tablet by mouth twice a day for edema 60 tablet 3   No current facility-administered medications on file prior to visit.     No Known  Allergies  Past Medical History:  Diagnosis Date  . Asthma   . Basal cell carcinoma   . Cataract 2018   bilateral eyes; corrected with surgery  . COPD (chronic obstructive pulmonary disease) ([place])   . Diastolic dysfunction    a. echo 07/2014: EF 55-60%, no RWMA, GR2DD, mild MR, LA moderately dilated, PASP 38 mm Hg  . Dyslipidemia   . Hemihypertrophy   . History of cardiac cath    a. 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 History of gout   . Hyperplastic colonic polyp 2003  . Hypertension   . Hypokalemia   . Morbid obesity ([place])   . PAF (paroxysmal atrial fibrillation) ([facility])    a. on Pradaxa; b. CHADSVASc at least 2 (HTN & female)  . Rheumatoid arthritis(714.0)   . Sleep apnea    a. not compliant with CPAP    Past  Surgical History:  Procedure Laterality Date  . CARDIAC CATHETERIZATION  05/24/2010   nonobstructive CAD  . CATARACT EXTRACTION W/ INTRAOCULAR LENS IMPLANT Right 06/12/2016   Dr. Darleen Crocker  . CATARACT EXTRACTION W/ INTRAOCULAR LENS IMPLANT Left 06/26/2016   Dr. Darleen Crocker  . CESAREAN SECTION    . CHOLECYSTECTOMY    . COLONOSCOPY  06/12/2011   Procedure: COLONOSCOPY;  Surgeon: Juanita Craver, MD;  [facility];  Service: Endoscopy;  Laterality: N/A;  . COLONOSCOPY N/A 03/17/2013   Procedure: COLONOSCOPY;  Surgeon: Juanita Craver, MD;  [facility];  Service: Endoscopy;  Laterality: N/A;  . EYE SURGERY    . KNEE ARTHROSCOPY     bilateral  . TOOTH EXTRACTION  12/2016  . VAGINAL HYSTERECTOMY      Family History  Problem Relation Age of Onset  . Emphysema Father        smoked  . Tuberculosis Mother   . Parkinsonism Mother   . Diabetes type II Sister   . Breast cancer Sister   . Breast cancer Maternal Aunt   . Tuberculosis Sister     Social History   Socioeconomic History  . Marital status: Married    Spouse name: Not on file  . Number of children: 1  . Years of education: Not on file  . Highest education level: Not on file  Social Needs  . Financial resource strain: Not on file  . Food insecurity - worry: Not on file  . Food insecurity - inability: Not on file  . Transportation needs - medical: Not on file  . Transportation needs - non-medical: Not on file  Occupational History  . 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 Smoking status: Never Smoker  . Smokeless tobacco: Never Used  Substance and Sexual Activity  . Alcohol use: No  . Drug use: No  . Sexual activity: Not on file  Other Topics Concern  . Not on file  Social History Narrative   No living will   Husband, then daughter should be decision maker   Would accept resuscitation attempts   Not sure about tube feeds   Review of  Systems Has lost a lot of weight---60# per her Weight Watchers Bowels are okay--uses gummy laxative. Discussed adding miralax/senna s No skin problems Has old, ailing dog--feels responsibility No persistent depressed mood  Objective:   Physical Exam  Constitutional: No distress.  Neck: No thyromegaly present.  Cardiovascular: Normal rate, regular rhythm and normal heart sounds. Exam reveals no gallop.  No  murmur heard. Pulmonary/Chest: Effort normal and breath sounds normal. No respiratory distress. She has no wheezes. She has no rales.  Abdominal: Soft. There is no tenderness.  Musculoskeletal:  Thick calves without pitting  Lymphadenopathy:    She has no cervical adenopathy.  Psychiatric: She has a normal mood and affect. Her behavior is normal.          Assessment & Plan:

## 2017-03-09 NOTE — Assessment & Plan Note (Signed)
Multiple sites  On the oxycodone

## 2017-03-09 NOTE — Assessment & Plan Note (Signed)
Sees Dr Dossie Der Reasonable control

## 2017-03-09 NOTE — Assessment & Plan Note (Signed)
Will check UDS No concerns with CSRS

## 2017-03-09 NOTE — Assessment & Plan Note (Signed)
Rate 60 and regular No symptoms On pradaxa

## 2017-03-09 NOTE — Assessment & Plan Note (Signed)
No recent symptoms Observation only

## 2017-03-12 ENCOUNTER — Telehealth: Payer: Self-pay | Admitting: Internal Medicine

## 2017-03-12 NOTE — Telephone Encounter (Signed)
Copied from [place] [id number]. Topic: Quick Communication - See Telephone Encounter >> Mar 12, 2017 12:16 PM Bea Graff, NT wrote: CRM for notification. See Telephone encounter for: Patient is needing a letter that states she cannot do jury duty. She has some specifics she needs in the letter that she would like to discuss with Dr. Silvio Pate or his nurse. Please advise.   03/12/17.

## 2017-03-12 NOTE — Telephone Encounter (Signed)
Mrs Stryker request letter to excuse pt from jury duty on 04/26/17  and pt request documentation of disability; broken bone in foot with osteoarthritis and rheumatoid arthritis; pt wearing a boot and cannot drive in letter; pt is also considered a fall risk due to foot condition. Pt cannot sit for any length of time due to diuretic med. Pt also has degenerative disc disease.Pt request cb when letter is ready for pick up.

## 2017-03-13 ENCOUNTER — Encounter: Payer: Self-pay | Admitting: Internal Medicine

## 2017-03-13 DIAGNOSIS — Z0279 Encounter for issue of other medical certificate: Secondary | ICD-10-CM

## 2017-03-13 NOTE — Telephone Encounter (Signed)
Patient notified letter is ready for pick up and $20 charge. °

## 2017-03-13 NOTE — Telephone Encounter (Signed)
Note written I have only seen her once so only feel comfortable with a 6 month deferral for now. $20 charge.

## 2017-03-16 LAB — PAIN MGMT, PROFILE 8 W/CONF, U
6 Acetylmorphine: NEGATIVE ng/mL (ref <10)
Alcohol Metabolites: NEGATIVE ng/mL (ref <500)
Amphetamines: NEGATIVE ng/mL (ref <500)
BUPRENORPHINE, URINE: NEGATIVE ng/mL (ref <5)
BUPRENORPHINE: NEGATIVE ng/mL (ref <2)
Benzodiazepines: NEGATIVE ng/mL (ref <100)
CREATININE: 80.1 mg/dL
Cocaine Metabolite: NEGATIVE ng/mL (ref <150)
Codeine: NEGATIVE ng/mL (ref <50)
Hydrocodone: NEGATIVE ng/mL (ref <50)
Hydromorphone: NEGATIVE ng/mL (ref <50)
MARIJUANA METABOLITE: NEGATIVE ng/mL (ref <20)
MDMA: NEGATIVE ng/mL (ref <500)
MORPHINE: NEGATIVE ng/mL (ref <50)
NORHYDROCODONE: NEGATIVE ng/mL (ref <50)
Norbuprenorphine: NEGATIVE ng/mL (ref <2)
Noroxycodone: 1798 ng/mL — ABNORMAL HIGH (ref <50)
OXIDANT: NEGATIVE ug/mL (ref <200)
OXYCODONE: 1205 ng/mL — AB (ref <50)
Opiates: NEGATIVE ng/mL (ref <100)
Oxycodone: POSITIVE ng/mL — AB (ref <100)
Oxymorphone: 692 ng/mL — ABNORMAL HIGH (ref <50)
PH: 5.52 (ref 4.5–9.0)

## 2017-03-26 DIAGNOSIS — M1A09X0 Idiopathic chronic gout, multiple sites, without tophus (tophi): Secondary | ICD-10-CM | POA: Diagnosis not present

## 2017-03-26 DIAGNOSIS — Z79899 Other long term (current) drug therapy: Secondary | ICD-10-CM | POA: Diagnosis not present

## 2017-03-26 DIAGNOSIS — M5136 Other intervertebral disc degeneration, lumbar region: Secondary | ICD-10-CM | POA: Diagnosis not present

## 2017-03-26 DIAGNOSIS — R5383 Other fatigue: Secondary | ICD-10-CM | POA: Diagnosis not present

## 2017-03-26 DIAGNOSIS — M150 Primary generalized (osteo)arthritis: Secondary | ICD-10-CM | POA: Diagnosis not present

## 2017-03-26 DIAGNOSIS — M0589 Other rheumatoid arthritis with rheumatoid factor of multiple sites: Secondary | ICD-10-CM | POA: Diagnosis not present

## 2017-03-26 DIAGNOSIS — M797 Fibromyalgia: Secondary | ICD-10-CM | POA: Diagnosis not present

## 2017-04-02 ENCOUNTER — Other Ambulatory Visit: Payer: Self-pay | Admitting: Internal Medicine

## 2017-04-02 NOTE — Telephone Encounter (Signed)
Requesting oxycodone rx. Last refilled and qty: #120 on 03/05/17 Last seen: 03/09/17 Last UDS: 07/12/16. [facility] [address]; pt had called early to be sure she can pick up on 03/05/17.

## 2017-04-02 NOTE — Telephone Encounter (Signed)
°  Relation to JP:ETKK Call back number:[phone] / [phone]   Reason for call:  Patient requesting oxyCODONE (ROXICODONE) 5 MG immediate release tablet refill, patient aware please allow 48 to 72 hour turn around, please advise

## 2017-04-03 MED ORDER — OXYCODONE HCL 5 MG PO TABS: ORAL_TABLET | ORAL | 0 refills | Status: DC

## 2017-04-17 ENCOUNTER — Telehealth: Payer: Self-pay | Admitting: Internal Medicine

## 2017-04-17 NOTE — Telephone Encounter (Signed)
Pt  Husband   Was  Admitted  To  Hospital   Early yest  He  Is  The  Hospital  Now   Diagnosed  With  Flu  Type  A .  The  Pt  Is   Asking  If  She  Needs    Tamiflu .  At  This  Point  Has symptoms  Of  Scratchy throat   Mild   Headache     But n  Signs  Of  Fever.  She is   Wearing a  Mask  While in her  Husbands   Room .   She  Is   Encouraged  To   Drink  Lots  Of fluids  And  Rest . She  Is  Just  Wondering  If  She  Needs  tamiflu ?    Pts  Phone  Number is   [phone] .    Pts requested  Pharmacy is   [place]

## 2017-04-17 NOTE — Telephone Encounter (Signed)
Attempted to call the pt's cell phone to see if she was experiencing any flu like symptoms but no answer at this time. Called home phone initially but someone else answered the phone and stated that she could be contacted on her cell phone.  Mailbox is full and can not accept any messages at this time.

## 2017-04-17 NOTE — Telephone Encounter (Signed)
Copied from [place]. Topic: Quick Communication - See Telephone Encounter >> Apr 17, 2017  4:35 PM Arletha Grippe wrote: CRM for notification. See Telephone encounter for:   04/17/17. Pt called - her husband is hospitalized with type a flu.  Pt is asking for tamiflu to be called in fpr preventative.  Pt can not get into office, her husband drives her normally, she also has a boot on her foot, and her sister who can drive is sick too.   [id number] - [place] [address], [place] [phone]  Please call cell number at [phone]

## 2017-04-18 MED ORDER — OSELTAMIVIR PHOSPHATE 75 MG PO CAPS: 75.0000 mg | ORAL_CAPSULE | Freq: Two times a day (BID) | ORAL | 0 refills | Status: DC

## 2017-04-18 NOTE — Telephone Encounter (Signed)
Spoke to pt. She started running a fever last night and cough worsened. She will start taking tamiflu bid and stay home.

## 2017-04-18 NOTE — Telephone Encounter (Signed)
I would recommend tamiflu 75 mg daily for 10 days (unless she is starting with true flu like symptoms---in which case she should stay home and take 75mg  bid x 5 days)

## 2017-05-02 DIAGNOSIS — M19171 Post-traumatic osteoarthritis, right ankle and foot: Secondary | ICD-10-CM | POA: Diagnosis not present

## 2017-05-02 DIAGNOSIS — M79671 Pain in right foot: Secondary | ICD-10-CM | POA: Diagnosis not present

## 2017-05-04 ENCOUNTER — Other Ambulatory Visit: Payer: Self-pay | Admitting: Internal Medicine

## 2017-05-04 MED ORDER — OXYCODONE HCL 5 MG PO TABS: ORAL_TABLET | ORAL | 0 refills | Status: DC

## 2017-05-04 NOTE — Telephone Encounter (Signed)
Last refill 04/03/17 #120 Last office visit 03/09/17

## 2017-05-04 NOTE — Telephone Encounter (Signed)
Copied from [place] [phone]. Topic: Quick Communication - Rx Refill/Question >> May 04, 2017 11:57 AM Scherrie Gerlach wrote: Medication: oxyCODONE (ROXICODONE) 5 MG immediate release tablet   Has the patient contacted their pharmacy? yes  Pt states [facility] to [facility] dr for the refill. Pt was instructed to call her pharmacy, now pharmacy refused to do for her.  Pt very frustrated.  Advised pt I would follow through with request for her refill.  [id number] - [address], [place] [phone] (Phone) [phone] (Fax)

## 2017-05-08 ENCOUNTER — Ambulatory Visit (INDEPENDENT_AMBULATORY_CARE_PROVIDER_SITE_OTHER): Payer: Medicare Other | Admitting: Internal Medicine

## 2017-05-08 ENCOUNTER — Encounter: Payer: Self-pay | Admitting: Internal Medicine

## 2017-05-08 VITALS — BP 118/76 | HR 78 | Temp 97.6°F | Wt 275.0 lb

## 2017-05-08 DIAGNOSIS — R110 Nausea: Secondary | ICD-10-CM | POA: Insufficient documentation

## 2017-05-08 MED ORDER — ONDANSETRON HCL 4 MG PO TABS: 4.0000 mg | ORAL_TABLET | Freq: Three times a day (TID) | ORAL | 1 refills | Status: DC

## 2017-05-08 NOTE — Progress Notes (Signed)
Subjective:    Patient ID: Tina Pacheco, female    DOB: 1950-12-27, 67 y.o.   MRN: [id number]  HPI Here due to headache and skin symptoms  Had flu symptoms-- started 2/19 Cough persisted till just a few days ago  Now still with headache and nausea Is prone to motion sickness--and it is kind of like that Feels it even if sitting still Has tried dramamine---brief help  No vomiting Headache sort of like migraine---worse with movement, bright lights or sounds Tried excedrin migraine--- not as helpful as in the past Usually just sits still and it goes away in variable time Seems to be continuous--but variable intensity  Current Outpatient Medications on File Prior to Visit  Medication Sig Dispense Refill  . amLODipine (NORVASC) 5 MG tablet take 1 tablet by mouth once daily 90 tablet 1  . atorvastatin (LIPITOR) 20 MG tablet TAKE 1 TABLET DAILY. NEEDS FASTING LABS NOW. 90 tablet 0  . Febuxostat (ULORIC PO) Take 40 mg by mouth every morning.     . fluticasone (FLONASE) 50 MCG/ACT nasal spray Place 2 sprays into both nostrils daily as needed (nasal stuffiness).    . gabapentin (NEURONTIN) 300 MG capsule TAKE 1 CAPSULE BY MOUTH THREE TIMES A DAY(TAKE 1 CAPSULE 2-3 HOURS BEFORE BEDTIME) 90 capsule 2  . hydroxychloroquine (PLAQUENIL) 200 MG tablet Take by mouth 2 (two) times daily.    Marland Kitchen KLOR-CON M20 20 MEQ tablet TAKE 1/2 TABLET (=10MEQ)   TWO TIMES A DAY ; MAY TAKE AND EXTRA TABLET WHEN      TAKING TORSEMIDE 180 tablet 3  . leflunomide (ARAVA) 20 MG tablet Take 20 mg by mouth every morning.     . loratadine (CLARITIN) 10 MG tablet Take 10 mg by mouth daily.    . metoprolol succinate (TOPROL-XL) 50 MG 24 hr tablet Take 1 tablet (50 mg total) by mouth daily. Take with or immediately following a meal. 90 tablet 3  . montelukast (SINGULAIR) 10 MG tablet Take 1 tablet (10 mg total) by mouth at bedtime. 90 tablet 3  . Multiple Vitamin (MULTIVITAMIN) tablet Take 1 tablet by mouth every morning.      Marland Kitchen oxyCODONE (ROXICODONE) 5 MG immediate release tablet Take 2 tablets by mouth every 12 hours 120 tablet 0  . PRADAXA 150 MG CAPS capsule TAKE 1 CAPSULE TWICE DAILY 180 capsule 3  . pregabalin (LYRICA) 150 MG capsule Take 150 mg by mouth 3 (three) times daily.    Marland Kitchen terazosin (HYTRIN) 10 MG capsule take 1 capsule by mouth at bedtime 90 capsule 0  . tolterodine (DETROL LA) 2 MG 24 hr capsule Take 1 capsule (2 mg total) by mouth daily. (Patient taking differently: Take 2 mg by mouth as needed. ) 303 capsule 3  . torsemide (DEMADEX) 20 MG tablet take 1 tablet by mouth twice a day for edema 60 tablet 3   No current facility-administered medications on file prior to visit.     No Known Allergies  Past Medical History:  Diagnosis Date  . Asthma   . Basal cell carcinoma   . Cataract 2018   bilateral eyes; corrected with surgery  . COPD (chronic obstructive pulmonary disease) ([place])   . Diastolic dysfunction    a. echo 07/2014: EF 55-60%, no RWMA, GR2DD, mild MR, LA moderately dilated, PASP 38 mm Hg  . Dyslipidemia   . Hemihypertrophy   . History of cardiac cath    a. 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  History of gout   . Hyperplastic colonic polyp 2003  . Hypertension   . Hypokalemia   . Morbid obesity ([place])   . PAF (paroxysmal atrial fibrillation) ([facility])    a. on Pradaxa; b. CHADSVASc at least 2 (HTN & female)  . Rheumatoid arthritis(714.0)   . Sleep apnea    a. not compliant with CPAP    Past Surgical History:  Procedure Laterality Date  . CARDIAC CATHETERIZATION  05/24/2010   nonobstructive CAD  . CATARACT EXTRACTION W/ INTRAOCULAR LENS IMPLANT Right 06/12/2016   Dr. Darleen Crocker  . CATARACT EXTRACTION W/ INTRAOCULAR LENS IMPLANT Left 06/26/2016   Dr. Darleen Crocker  . CESAREAN SECTION    . CHOLECYSTECTOMY    . COLONOSCOPY  06/12/2011   Procedure: COLONOSCOPY;  Surgeon: Juanita Craver, MD;  Location: [facility];  Service: Endoscopy;  Laterality: N/A;  .  COLONOSCOPY N/A 03/17/2013   Procedure: COLONOSCOPY;  Surgeon: Juanita Craver, MD;  Location: [facility];  Service: Endoscopy;  Laterality: N/A;  . EYE SURGERY    . KNEE ARTHROSCOPY     bilateral  . TOOTH EXTRACTION  12/2016  . VAGINAL HYSTERECTOMY      Family History  Problem Relation Age of Onset  . Emphysema Father        smoked  . Tuberculosis Mother   . Parkinsonism Mother   . Diabetes type II Sister   . Breast cancer Sister   . Breast cancer Maternal Aunt   . Tuberculosis Sister     Social History   Socioeconomic History  . Marital status: Married    Spouse name: Not on file  . Number of children: 1  . Years of education: Not on file  . Highest education level: Not on file  Social Needs  . Financial resource strain: Not on file  . Food insecurity - worry: Not on file  . Food insecurity - inability: Not on file  . Transportation needs - medical: Not on file  . Transportation needs - non-medical: Not on file  Occupational History  . 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 Smoking status: Never Smoker  . Smokeless tobacco: Never Used  Substance and Sexual Activity  . Alcohol use: No  . Drug use: No  . Sexual activity: Not on file  Other Topics Concern  . Not on file  Social History Narrative   No living will   Husband, then daughter should be decision maker   Would accept resuscitation attempts   Not sure about tube feeds   Review of Systems  No vision loss No focal weakness --but has pain in back ("like skin is abraded") Nose fairly clear--but has slight "stuffed" feeling Appetite is poor No true vertigo    Objective:   Physical Exam  Constitutional: No distress.  Appears tired but not in distress  Eyes:  No nystagmus  Neck: No thyromegaly present.  Abdominal: Soft. She exhibits no distension. There is no tenderness. There is no rebound and no guarding.  Lymphadenopathy:    She has no cervical  adenopathy.  Neurological:  No facial asymmetry  Normal UE strength Antalgic gait due to right foot          Assessment & Plan:

## 2017-05-08 NOTE — Assessment & Plan Note (Signed)
Post illness Associated with migraine type syndrome which is low level but continuous No worrisome neuro findings Will try zofran regularly---change to phenergan if not effective Discussed limiting caffeine

## 2017-05-10 ENCOUNTER — Ambulatory Visit: Payer: Self-pay | Admitting: *Deleted

## 2017-05-10 ENCOUNTER — Encounter: Payer: Self-pay | Admitting: *Deleted

## 2017-05-10 MED ORDER — PROMETHAZINE HCL 25 MG PO TABS: ORAL_TABLET | ORAL | 1 refills | Status: DC

## 2017-05-10 NOTE — Telephone Encounter (Signed)
This encounter was created in error - please disregard.

## 2017-05-10 NOTE — Telephone Encounter (Signed)
Okay to send Rx for phenergan 25mg  #30 x 1 12.5-25mg  tid for nausea

## 2017-05-10 NOTE — Telephone Encounter (Signed)
Pt  Seen By Dr  Silvio Pate  2  Days  Ago  Pt  Was  Rx  Zofran  -  It  Is  Not  Working She  Reports Pt reports  Still has  A bit of headache   Reports  Nausea  That  Will not  Go  Away   Pt  Has  intermittant  Dry  Heaves  OV  Reviewed   Note  Stated  If  Zofran   Not  Effective  Will try Phenergan   Pts  Call back  [phone]   Pharmacy of  Choice   [facility]  [place]

## 2017-05-10 NOTE — Addendum Note (Signed)
Addended by: Pilar Grammes on: 05/10/2017 01:28 PM   Modules accepted: Orders

## 2017-05-10 NOTE — Telephone Encounter (Signed)
Spoke to pt. She said at her recent OV, it was mentioned that if Zofran did not work, she could try phenergan. She does not have any. Will need rx.

## 2017-05-10 NOTE — Telephone Encounter (Signed)
Spoke to pt

## 2017-05-14 ENCOUNTER — Other Ambulatory Visit: Payer: Self-pay | Admitting: Family Medicine

## 2017-05-15 NOTE — Telephone Encounter (Signed)
SB-Plz see refill req/thx dmf 

## 2017-05-16 ENCOUNTER — Telehealth: Payer: Self-pay | Admitting: Internal Medicine

## 2017-05-16 ENCOUNTER — Other Ambulatory Visit: Payer: Self-pay | Admitting: Internal Medicine

## 2017-05-16 NOTE — Telephone Encounter (Signed)
Spoke to pt. She had an issue with constipation yesterday but that is clearing up. She wants to see how she does tonight and will call if she needs to be seen.

## 2017-05-16 NOTE — Telephone Encounter (Signed)
Copied from [place] [phone]. Topic: Quick Communication - See Telephone Encounter >> May 16, 2017 12:21 PM Antonieta Iba C wrote:    CRM for notification. See Telephone encounter for: pt called in to be advised. She said that provider gave her phenergan and she is still not feeling well. Pt says that she is still feeling nauseous. Pt would like suggestions on what she should do next?    CB: [phone]   05/16/17.

## 2017-05-16 NOTE — Telephone Encounter (Signed)
She is probably going to need to come back in again. If she still has the headache/migraine type symptoms--it may be worth having her come in for a cortisone shot (to see if that aborts it). If she wants to try this, give depomedrol. 80mg  IM and then she will need appt if symptoms persist. Not sure if GI or neuro specialist might be helpful if it persists

## 2017-05-24 ENCOUNTER — Other Ambulatory Visit: Payer: Self-pay | Admitting: Family Medicine

## 2017-05-26 NOTE — Telephone Encounter (Signed)
Approved: okay x 1 year 

## 2017-05-27 ENCOUNTER — Other Ambulatory Visit: Payer: Self-pay | Admitting: Cardiovascular Disease

## 2017-05-28 NOTE — Telephone Encounter (Signed)
Please review for refill. Thanks!  

## 2017-06-04 ENCOUNTER — Other Ambulatory Visit: Payer: Self-pay | Admitting: Internal Medicine

## 2017-06-04 MED ORDER — OXYCODONE HCL 5 MG PO TABS
ORAL_TABLET | ORAL | 0 refills | Status: DC
Start: 2017-06-04 — End: 2017-06-29

## 2017-06-04 NOTE — Telephone Encounter (Signed)
Fortunately I caught that this was being sent to [facility] and I changed it to the [facility]

## 2017-06-04 NOTE — Telephone Encounter (Signed)
Oxycodone refill request    Control substance   Last Refilled 05/04/2017  LOV 05/08/2017  Pharmacy requested [address]

## 2017-06-04 NOTE — Telephone Encounter (Unsigned)
Copied from [place] [phone]. Topic: Quick Communication - Rx Refill/Question >> Jun 04, 2017  4:07 PM Percell Belt A wrote: Medication: oxyCODONE (ROXICODONE) 5 MG immediate release tablet [[id number]] Has the patient contacted their pharmacy? No  (Agent: If no, request that the patient contact the pharmacy for the refill.) Preferred Pharmacy (with phone number or street name): [place]  Agent: Please be advised that RX refills may take up to 3 business days. We ask that you follow-up with your pharmacy.

## 2017-06-04 NOTE — Telephone Encounter (Signed)
Last refill 05/04/17 #120

## 2017-06-06 ENCOUNTER — Ambulatory Visit (INDEPENDENT_AMBULATORY_CARE_PROVIDER_SITE_OTHER): Payer: Medicare Other | Admitting: Internal Medicine

## 2017-06-06 ENCOUNTER — Encounter: Payer: Self-pay | Admitting: Internal Medicine

## 2017-06-06 VITALS — BP 118/78 | HR 56 | Temp 97.6°F | Ht 65.0 in | Wt 281.0 lb

## 2017-06-06 DIAGNOSIS — M7741 Metatarsalgia, right foot: Secondary | ICD-10-CM | POA: Diagnosis not present

## 2017-06-06 DIAGNOSIS — F1120 Opioid dependence, uncomplicated: Secondary | ICD-10-CM

## 2017-06-06 NOTE — Assessment & Plan Note (Signed)
Reviewed CSRS--no surprises No change needed

## 2017-06-06 NOTE — Assessment & Plan Note (Signed)
This is her biggest pain issue right now Considering surgery with the podiatrist

## 2017-06-06 NOTE — Progress Notes (Signed)
Subjective:    Patient ID: Tina Pacheco, female    DOB: September 26, 1950, 67 y.o.   MRN: [id number]  HPI Here for follow up of chronic pain and narcotic dependence  Right foot pain is worse Still in walking boot Considering surgery with Dr Elvina Mattes soon Can't even use her recumbent bicycle due to this  [facility] with rollator (but hard to use when out of her home)  RA generally controlled lyrica from Dr Dossie Der  Still uses the oxycodone 10 bid For back, foot, etc  Current Outpatient Medications on File Prior to Visit  Medication Sig Dispense Refill  . amLODipine (NORVASC) 5 MG tablet TAKE 1 TABLET BY MOUTH ONCE DAILY 90 tablet 3  . atorvastatin (LIPITOR) 20 MG tablet Take 1 tablet (20 mg total) by mouth daily at 6 PM. 90 tablet 1  . Febuxostat (ULORIC PO) Take 40 mg by mouth every morning.     . fluticasone (FLONASE) 50 MCG/ACT nasal spray Place 2 sprays into both nostrils daily as needed (nasal stuffiness).    . gabapentin (NEURONTIN) 300 MG capsule TAKE 1 CAPSULE BY MOUTH THREE TIMES A DAY(TAKE 1 CAPSULE 2-3 HOURS BEFORE BEDTIME) 90 capsule 2  . hydroxychloroquine (PLAQUENIL) 200 MG tablet Take by mouth 2 (two) times daily.    Marland Kitchen KLOR-CON M20 20 MEQ tablet TAKE 1/2 TABLET (=10MEQ)   TWO TIMES A DAY ; MAY TAKE AND EXTRA TABLET WHEN      TAKING TORSEMIDE 180 tablet 3  . leflunomide (ARAVA) 20 MG tablet Take 20 mg by mouth every morning.     . loratadine (CLARITIN) 10 MG tablet Take 10 mg by mouth daily.    . metoprolol succinate (TOPROL-XL) 50 MG 24 hr tablet Take 1 tablet (50 mg total) by mouth daily. Take with or immediately following a meal. 90 tablet 3  . montelukast (SINGULAIR) 10 MG tablet Take 1 tablet (10 mg total) by mouth at bedtime. 90 tablet 3  . Multiple Vitamin (MULTIVITAMIN) tablet Take 1 tablet by mouth every morning.     Marland Kitchen oxyCODONE (ROXICODONE) 5 MG immediate release tablet Take 2 tablets by mouth every 12 hours 120 tablet 0  . PRADAXA 150 MG CAPS capsule TAKE 1 CAPSULE  TWICE DAILY 180 capsule 3  . pregabalin (LYRICA) 150 MG capsule Take 150 mg by mouth 3 (three) times daily.    . promethazine (PHENERGAN) 25 MG tablet 1/2 to 1 tablet 3 times a day as needed for nausea 30 tablet 1  . terazosin (HYTRIN) 10 MG capsule TAKE 1 CAPSULE BY MOUTH AT BEDTIME 90 capsule 3  . tolterodine (DETROL LA) 2 MG 24 hr capsule Take 1 capsule (2 mg total) by mouth daily. (Patient taking differently: Take 2 mg by mouth as needed. ) 303 capsule 3  . torsemide (DEMADEX) 20 MG tablet take 1 tablet by mouth twice a day for edema 60 tablet 3   No current facility-administered medications on file prior to visit.     No Known Allergies  Past Medical History:  Diagnosis Date  . Asthma   . Basal cell carcinoma   . Cataract 2018   bilateral eyes; corrected with surgery  . COPD (chronic obstructive pulmonary disease) ([place])   . Diastolic dysfunction    a. echo 07/2014: EF 55-60%, no RWMA, GR2DD, mild MR, LA moderately dilated, PASP 38 mm Hg  . Dyslipidemia   . Hemihypertrophy   . History of cardiac cath    a. cardiac cath 05/24/2010 - nonobstructive  CAD  . History of gout   . Hyperplastic colonic polyp 2003  . Hypertension   . Hypokalemia   . Morbid obesity ([place])   . PAF (paroxysmal atrial fibrillation) ([facility])    a. on Pradaxa; b. CHADSVASc at least 2 (HTN & female)  . Rheumatoid arthritis(714.0)   . Sleep apnea    a. not compliant with CPAP    Past Surgical History:  Procedure Laterality Date  . CARDIAC CATHETERIZATION  05/24/2010   nonobstructive CAD  . CATARACT EXTRACTION W/ INTRAOCULAR LENS IMPLANT Right 06/12/2016   Dr. Darleen Crocker  . CATARACT EXTRACTION W/ INTRAOCULAR LENS IMPLANT Left 06/26/2016   Dr. Darleen Crocker  . CESAREAN SECTION    . CHOLECYSTECTOMY    . COLONOSCOPY  06/12/2011   Procedure: COLONOSCOPY;  Surgeon: Juanita Craver, MD;  Location: [facility];  Service: Endoscopy;  Laterality: N/A;  . COLONOSCOPY N/A 03/17/2013   Procedure: COLONOSCOPY;  Surgeon:  Juanita Craver, MD;  Location: [facility];  Service: Endoscopy;  Laterality: N/A;  . EYE SURGERY    . KNEE ARTHROSCOPY     bilateral  . TOOTH EXTRACTION  12/2016  . VAGINAL HYSTERECTOMY      Family History  Problem Relation Age of Onset  . Emphysema Father        smoked  . Tuberculosis Mother   . Parkinsonism Mother   . Diabetes type II Sister   . Breast cancer Sister   . Breast cancer Maternal Aunt   . Tuberculosis Sister     Social History   Socioeconomic History  . Marital status: Married    Spouse name: Not on file  . Number of children: 1  . Years of education: Not on file  . Highest education level: Not on file  Occupational History  . 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 Financial resource strain: Not on file  . Food insecurity:    Worry: Not on file    Inability: Not on file  . Transportation needs:    Medical: Not on file    Non-medical: Not on file  Tobacco Use  . Smoking status: Never Smoker  . Smokeless tobacco: Never Used  Substance and Sexual Activity  . Alcohol use: No  . Drug use: No  . Sexual activity: Not on file  Lifestyle  . Physical activity:    Days per week: Not on file    Minutes per session: Not on file  . Stress: Not on file  Relationships  . Social connections:    Talks on phone: Not on file    Gets together: Not on file    Attends religious service: Not on file    Active member of club or organization: Not on file    Attends meetings of clubs or organizations: Not on file    Relationship status: Not on file  . Intimate partner violence:    Fear of current or ex partner: Not on file    Emotionally abused: Not on file    Physically abused: Not on file    Forced sexual activity: Not on file  Other Topics Concern  . Not on file  Social History Narrative   No living will   Husband, then daughter should be decision maker   Would accept resuscitation attempts   Not sure about  tube feeds   Review of Systems Appetite is okay Hoping to get back on [employer] Bad  flu---had some confusion during that illness---seems back to herself again    Objective:   Physical Exam  Constitutional: No distress.  Psychiatric: She has a normal mood and affect. Her behavior is normal.          Assessment & Plan:

## 2017-06-12 DIAGNOSIS — M19171 Post-traumatic osteoarthritis, right ankle and foot: Secondary | ICD-10-CM | POA: Diagnosis not present

## 2017-06-12 DIAGNOSIS — S92254P Nondisplaced fracture of navicular [scaphoid] of right foot, subsequent encounter for fracture with malunion: Secondary | ICD-10-CM | POA: Diagnosis not present

## 2017-06-12 DIAGNOSIS — M79671 Pain in right foot: Secondary | ICD-10-CM | POA: Diagnosis not present

## 2017-06-27 DIAGNOSIS — M150 Primary generalized (osteo)arthritis: Secondary | ICD-10-CM | POA: Diagnosis not present

## 2017-06-27 DIAGNOSIS — M0589 Other rheumatoid arthritis with rheumatoid factor of multiple sites: Secondary | ICD-10-CM | POA: Diagnosis not present

## 2017-06-27 DIAGNOSIS — M5136 Other intervertebral disc degeneration, lumbar region: Secondary | ICD-10-CM | POA: Diagnosis not present

## 2017-06-27 DIAGNOSIS — R5383 Other fatigue: Secondary | ICD-10-CM | POA: Diagnosis not present

## 2017-06-27 DIAGNOSIS — M79671 Pain in right foot: Secondary | ICD-10-CM | POA: Diagnosis not present

## 2017-06-27 DIAGNOSIS — M797 Fibromyalgia: Secondary | ICD-10-CM | POA: Diagnosis not present

## 2017-06-27 DIAGNOSIS — E669 Obesity, unspecified: Secondary | ICD-10-CM | POA: Diagnosis not present

## 2017-06-27 DIAGNOSIS — Z79899 Other long term (current) drug therapy: Secondary | ICD-10-CM | POA: Diagnosis not present

## 2017-06-27 DIAGNOSIS — M1A09X0 Idiopathic chronic gout, multiple sites, without tophus (tophi): Secondary | ICD-10-CM | POA: Diagnosis not present

## 2017-06-28 ENCOUNTER — Other Ambulatory Visit: Payer: Self-pay | Admitting: Internal Medicine

## 2017-06-28 NOTE — Telephone Encounter (Signed)
Copied from [place] [phone]. Topic: Quick Communication - See Telephone Encounter >> Jun 28, 2017  5:02 PM Ivar Drape wrote: CRM for notification. See Telephone encounter for: 06/28/17. Patient would like a refill on her oxyCODONE (ROXICODONE) 5 MG immediate release tablet medication and sent to her preferred pharmacy [facility] [place]. [place]

## 2017-06-28 NOTE — Telephone Encounter (Signed)
Pt called to check status of RX. Advised that it was received 5/1 2:52pm and to allow up to 3 business days for refill processing. Advised her that it has not yet been approved.

## 2017-06-28 NOTE — Telephone Encounter (Signed)
Request just received today (06/28/17 at 2:52pm). Sent refill.

## 2017-06-29 ENCOUNTER — Ambulatory Visit: Payer: Federal, State, Local not specified - PPO | Admitting: Internal Medicine

## 2017-06-29 NOTE — Telephone Encounter (Signed)
Spoke with pt and she does have enough meds to last through the weekend until it's filled on Monday, she was just trying to call a few days ahead to make sure it was filled on due date, routed back to Dr. Glori Bickers to fill for Monday

## 2017-06-29 NOTE — Telephone Encounter (Signed)
I think that is too early - 30 days would be due Monday - let me know if I am wrong please  This is in PCP absence

## 2017-06-29 NOTE — Telephone Encounter (Signed)
Requesting ref[facility] [address] Last refilled # 120 on 06/04/17 Last seen 06/06/17 Last UDS: 03/09/17 Dr Silvio Pate out of office until 07/05/17.

## 2017-06-29 NOTE — Telephone Encounter (Signed)
Request for refill of oxycodone 5mg   Request for refill of oxy IR, last filled on 06/04/17 #120.  Dr. Hilton Cork  [address]

## 2017-07-02 MED ORDER — OXYCODONE HCL 5 MG PO TABS: ORAL_TABLET | ORAL | 0 refills | Status: DC

## 2017-07-02 NOTE — Telephone Encounter (Signed)
Send electronically in pcp absence

## 2017-07-03 DIAGNOSIS — M19071 Primary osteoarthritis, right ankle and foot: Secondary | ICD-10-CM | POA: Diagnosis not present

## 2017-07-03 DIAGNOSIS — M79671 Pain in right foot: Secondary | ICD-10-CM | POA: Diagnosis not present

## 2017-07-03 DIAGNOSIS — M19171 Post-traumatic osteoarthritis, right ankle and foot: Secondary | ICD-10-CM | POA: Diagnosis not present

## 2017-07-04 ENCOUNTER — Other Ambulatory Visit: Payer: Self-pay | Admitting: Podiatry

## 2017-07-04 ENCOUNTER — Telehealth: Payer: Self-pay | Admitting: Cardiovascular Disease

## 2017-07-04 NOTE — Telephone Encounter (Signed)
I talked with dr. troxler on the phone Ok to hold pradaxa 2-3 days should be enough but I think Dr. Elvina Mattes may have wanted more He mentioned 5 days which is fine He will use lovenox post surgery for a week  then change back to pradaxa

## 2017-07-04 NOTE — Telephone Encounter (Signed)
Pt is having foot surgery on 5/17, by Dr troxler, states she needs to know about her Pradaxa. Please advise.

## 2017-07-04 NOTE — Telephone Encounter (Signed)
Patient states that she has a upcoming foot surgery with Dr. Elvina Mattes and she wanted to know when she should hold her Pradaxa. Let her know that typically his office will send [place] a request for clearance and instructions. She verbalized understanding and requested that we also call her with instructions on holding her Pradaxa. Let her know that I would send this over to Dr. Rockey Situ for review and then I will call back with those instructions. She denied any changes since her last visit and had no further questions at this time.

## 2017-07-05 NOTE — Telephone Encounter (Signed)
Spoke with patient and reviewed that Dr. Rockey Situ has discussed her medication with Dr. Elvina Mattes and that he will advise on when to stop the medication in question. She verbalized understanding, was appreciative for the call back, and had no further questions at this time.

## 2017-07-06 ENCOUNTER — Encounter: Payer: Self-pay | Admitting: Internal Medicine

## 2017-07-06 ENCOUNTER — Ambulatory Visit (INDEPENDENT_AMBULATORY_CARE_PROVIDER_SITE_OTHER): Payer: Medicare Other | Admitting: Internal Medicine

## 2017-07-06 VITALS — BP 112/70 | HR 56 | Temp 97.6°F | Ht 65.0 in | Wt 299.0 lb

## 2017-07-06 DIAGNOSIS — Z01818 Encounter for other preprocedural examination: Secondary | ICD-10-CM | POA: Diagnosis not present

## 2017-07-06 DIAGNOSIS — I480 Paroxysmal atrial fibrillation: Secondary | ICD-10-CM

## 2017-07-06 DIAGNOSIS — I5032 Chronic diastolic (congestive) heart failure: Secondary | ICD-10-CM

## 2017-07-06 DIAGNOSIS — J479 Bronchiectasis, uncomplicated: Secondary | ICD-10-CM | POA: Diagnosis not present

## 2017-07-06 DIAGNOSIS — I5033 Acute on chronic diastolic (congestive) heart failure: Secondary | ICD-10-CM | POA: Insufficient documentation

## 2017-07-06 NOTE — Assessment & Plan Note (Signed)
This has not been a problem lately Should be mobilized early and consider incentive spirometry

## 2017-07-06 NOTE — Assessment & Plan Note (Signed)
Regular Should stop xarelto 2-3 days before the procedure and resume as soon as possible post op

## 2017-07-06 NOTE — Progress Notes (Signed)
Subjective:    Patient ID: Tina Pacheco, female    DOB: 23-Jul-1950, 67 y.o.   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 No PND. Nocturia x 1-2 ----stable. No chest pain No palpitations No dizziness or syncope  No fever or cough Last illness was flu in February Cough is rare now in general  Current Outpatient Medications on File Prior to Visit  Medication Sig Dispense Refill  . amLODipine (NORVASC) 5 MG tablet TAKE 1 TABLET BY MOUTH ONCE DAILY (Patient taking differently: Take 5 mgs by mouth once daily in the evening) 90 tablet 3  . atorvastatin (LIPITOR) 20 MG tablet Take 1 tablet (20 mg total) by mouth daily at 6 PM. (Patient taking differently: Take 20 mg by mouth every evening. ) 90 tablet 1  . diphenhydrAMINE (BENADRYL) 25 MG tablet Take 25 mg by mouth daily as needed for allergies.    . febuxostat (ULORIC) 40 MG tablet Take 40 mg by mouth daily.    . folic acid (FOLVITE) 1 MG tablet Take 1 mg by mouth daily.    Marland Kitchen gabapentin (NEURONTIN) 300 MG capsule TAKE 1 CAPSULE BY MOUTH THREE TIMES A DAY(TAKE 1 CAPSULE 2-3 HOURS BEFORE BEDTIME) 90 capsule 5  . hydroxychloroquine (PLAQUENIL) 200 MG tablet Take 200 mg by mouth 2 (two) times daily.     Marland Kitchen ibuprofen (ADVIL,MOTRIN) 200 MG tablet Take 400-600 mg by mouth 2 (two) times daily as needed.    Marland Kitchen KLOR-CON M20 20 MEQ tablet TAKE 1/2 TABLET (=10MEQ)   TWO TIMES A DAY ; MAY TAKE AND EXTRA TABLET WHEN      TAKING TORSEMIDE (Patient taking differently: TAKE 1/2 TABLET (=10MEQ)   TWO TIMES A DAY, INCREASE TO 20 MEQ TWICE DAILY WHEN TAKING TORSEMIDE) 180 tablet 3  . leflunomide (ARAVA) 20 MG tablet  Take 20 mg by mouth every morning.     . metoprolol succinate (TOPROL-XL) 50 MG 24 hr tablet Take 1 tablet (50 mg total) by mouth daily. Take with or immediately following a meal. (Patient taking differently: Take 50 mg by mouth every evening. Take with or immediately following a meal.) 90 tablet 3  . montelukast (SINGULAIR) 10 MG tablet Take 1 tablet (10 mg total) by mouth at bedtime. 90 tablet 3  . Multiple Vitamin (MULTIVITAMIN) tablet Take 1 tablet by mouth daily.     Marland Kitchen oxyCODONE (ROXICODONE) 5 MG immediate release tablet Take 2 tablets by mouth every 12 hours 120 tablet 0  . PRADAXA 150 MG CAPS capsule TAKE 1 CAPSULE TWICE DAILY 180 capsule 3  . pregabalin (LYRICA) 150 MG capsule Take 150 mg by mouth 3 (three) times daily.    . promethazine (PHENERGAN) 25 MG tablet 1/2 to 1 tablet 3 times a day as needed for nausea 30 tablet 1  . SF 5000 PLUS 1.1 % CREA dental cream Place 1 application onto teeth 2 (two) times daily.  1  . sodium chloride (OCEAN) 0.65 % SOLN nasal spray Place 1 spray into both nostrils as needed for congestion.    Marland Kitchen terazosin (HYTRIN) 10 MG capsule TAKE 1 CAPSULE BY MOUTH AT BEDTIME 90 capsule 3  . tolterodine (DETROL  LA) 2 MG 24 hr capsule Take 1 capsule (2 mg total) by mouth daily. (Patient taking differently: Take 2 mg by mouth daily as needed (bladder spasms). ) 303 capsule 3  . torsemide (DEMADEX) 20 MG tablet take 1 tablet by mouth twice a day for edema (Patient taking differently: Take 20 mg by mouth twice daily as needed for swelling) 60 tablet 3   No current facility-administered medications on file prior to visit.     No Known Allergies  Past Medical History:  Diagnosis Date  . Asthma   . Basal cell carcinoma   . Cataract 2018   bilateral eyes; corrected with surgery  . COPD (chronic obstructive pulmonary disease) ([place])   . Diastolic dysfunction    a. echo 07/2014: EF 55-60%, no RWMA, GR2DD, mild MR, LA moderately dilated, PASP 38 mm Hg  . Dyslipidemia   .  Hemihypertrophy   . History of cardiac cath    a. 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 History of gout   . Hyperplastic colonic polyp 2003  . Hypertension   . Hypokalemia   . Morbid obesity ([place])   . PAF (paroxysmal atrial fibrillation) ([facility])    a. on Pradaxa; b. CHADSVASc at least 2 (HTN & female)  . Rheumatoid arthritis(714.0)   . Sleep apnea    a. not compliant with CPAP    Past Surgical History:  Procedure Laterality Date  . CARDIAC CATHETERIZATION  05/24/2010   nonobstructive CAD  . CATARACT EXTRACTION W/ INTRAOCULAR LENS IMPLANT Right 06/12/2016   Dr. Darleen Crocker  . CATARACT EXTRACTION W/ INTRAOCULAR LENS IMPLANT Left 06/26/2016   Dr. Darleen Crocker  . CESAREAN SECTION    . CHOLECYSTECTOMY    . COLONOSCOPY  06/12/2011   Procedure: COLONOSCOPY;  Surgeon: Juanita Craver, MD;  Location: [facility];  Service: Endoscopy;  Laterality: N/A;  . COLONOSCOPY N/A 03/17/2013   Procedure: COLONOSCOPY;  Surgeon: Juanita Craver, MD;  Location: [facility];  Service: Endoscopy;  Laterality: N/A;  . EYE SURGERY    . KNEE ARTHROSCOPY     bilateral  . TOOTH EXTRACTION  12/2016  . VAGINAL HYSTERECTOMY      Family History  Problem Relation Age of Onset  . Emphysema Father        smoked  . Tuberculosis Mother   . Parkinsonism Mother   . Diabetes type II Sister   . Breast cancer Sister   . Breast cancer Maternal Aunt   . Tuberculosis Sister     Social History   Socioeconomic History  . Marital status: Married    Spouse name: Not on file  . Number of children: 1  . Years of education: Not on file  . Highest education level: Not on file  Occupational History  . 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 Financial resource strain: Not on file  . Food insecurity:    Worry: Not on file    Inability: Not on file  . Transportation needs:    Medical: Not on file    Non-medical: Not on file  Tobacco Use  . Smoking status:  Never Smoker  . Smokeless tobacco: Never Used  Substance and Sexual Activity  . Alcohol use: No  . Drug use: No  . Sexual activity: Not on file  Lifestyle  . Physical activity:    Days per week: Not on file    Minutes per session: Not on file  .  Stress: Not on file  Relationships  . Social connections:    Talks on phone: Not on file    Gets together: Not on file    Attends religious service: Not on file    Active member of club or organization: Not on file    Attends meetings of clubs or organizations: Not on file    Relationship status: Not on file  . Intimate partner violence:    Fear of current or ex partner: Not on file    Emotionally abused: Not on file    Physically abused: Not on file    Forced sexual activity: Not on file  Other Topics Concern  . Not on file  Social History Narrative   No living will   Husband, then daughter should be decision maker   Would accept resuscitation attempts   Not sure about tube feeds   Review of Systems Appetite is okay Sleep is variable---mostly related to getting comfortable in bed due to chronic pain     Objective:   Physical Exam  Constitutional: She appears well-developed. No distress.  Neck: No thyromegaly present.  Cardiovascular: Normal rate, regular rhythm, normal heart sounds and intact distal pulses. Exam reveals no gallop.  No murmur heard. Pulmonary/Chest: Effort normal and breath sounds normal. No stridor. No respiratory distress. She has no wheezes.  Musculoskeletal:  Trace ankle edema  Lymphadenopathy:    She has no cervical adenopathy.  Psychiatric: She has a normal mood and affect. Her behavior is normal.          Assessment & Plan:

## 2017-07-06 NOTE — Assessment & Plan Note (Addendum)
Seems to be optimized for surgery EKG --sinus bradycardia at 51. Normal axis and intervals.non specific ST changes which appear to be a normal variant. No sig change since 12/15/16  See other instructions

## 2017-07-06 NOTE — Assessment & Plan Note (Signed)
Compensated now Weight up slightly Recommended the diuretic daily for 3 days preop and monitor weight closely

## 2017-07-09 ENCOUNTER — Other Ambulatory Visit: Payer: Self-pay

## 2017-07-09 ENCOUNTER — Encounter
Admission: RE | Admit: 2017-07-09 | Discharge: 2017-07-09 | Disposition: A | Discharge status: Home / Self Care | Payer: Medicare Other | Source: Ambulatory Visit | Attending: Podiatry | Admitting: Podiatry

## 2017-07-09 DIAGNOSIS — Z01812 Encounter for preprocedural laboratory examination: Secondary | ICD-10-CM | POA: Diagnosis not present

## 2017-07-09 HISTORY — DX: Cardiac arrhythmia, unspecified: I49.9

## 2017-07-09 HISTORY — DX: Headache: R51

## 2017-07-09 HISTORY — DX: Unspecified osteoarthritis, unspecified site: M19.90

## 2017-07-09 HISTORY — DX: Headache, unspecified: R51.9

## 2017-07-09 HISTORY — DX: Claustrophobia: F40.240

## 2017-07-09 LAB — CBC
HEMATOCRIT: 38.8 % (ref 35.0–47.0)
HEMOGLOBIN: 12.9 g/dL (ref 12.0–16.0)
MCH: 28.9 pg (ref 26.0–34.0)
MCHC: 33.2 g/dL (ref 32.0–36.0)
MCV: 87.2 fL (ref 80.0–100.0)
Platelets: 155 10*3/uL (ref 150–440)
RBC: 4.45 MIL/uL (ref 3.80–5.20)
RDW: 16 % — ABNORMAL HIGH (ref 11.5–14.5)
WBC: 4.8 10*3/uL (ref 3.6–11.0)

## 2017-07-09 LAB — BASIC METABOLIC PANEL
ANION GAP: 8 (ref 5–15)
BUN: 27 mg/dL — AB (ref 6–20)
CHLORIDE: 101 mmol/L (ref 101–111)
CO2: 29 mmol/L (ref 22–32)
Calcium: 9.9 mg/dL (ref 8.9–10.3)
Creatinine, Ser: 1.06 mg/dL — ABNORMAL HIGH (ref 0.44–1.00)
GFR calc Af Amer: >60 mL/min (ref >60)
GFR calc non Af Amer: 53 mL/min — ABNORMAL LOW (ref >60)
GLUCOSE: 86 mg/dL (ref 65–99)
Potassium: 3.6 mmol/L (ref 3.5–5.1)
Sodium: 138 mmol/L (ref 135–145)

## 2017-07-09 NOTE — Pre-Procedure Instructions (Signed)
PATIENT CALLED BACK AND STOPPED PREDAXA AS OF 07/08/17

## 2017-07-09 NOTE — Anesthesia Pain Management Evaluation Note (Addendum)
CANT LAY ON RIGHT SIDE AND DIFFICULTY ON BACK DR TROXLER WANTS LMA WITH POPLITEAL BLOCK

## 2017-07-09 NOTE — Pre-Procedure Instructions (Signed)
CANT LAY ON RIGHT SIDE AND DIFFICULTY BEING ON BACK

## 2017-07-09 NOTE — Patient Instructions (Signed)
Your procedure is scheduled on: 07/13/17 Report to Day Surgery.[facility]OND FLOOR To find out your arrival time please call [phone] between 1PM - 3PM on 07/12/17.  Remember: Instructions that are not followed completely may result in serious medical risk, up to and including death, or upon the discretion of your surgeon and anesthesiologist your surgery may need to be rescheduled.     _X__ 1. Do not eat food after midnight the night before your procedure.                 No gum chewing or hard candies. You may drink clear liquids up to 2 hours                 before you are scheduled to arrive for your surgery- DO not drink clear                 liquids within 2 hours of the start of your surgery.                 Clear Liquids include:  water, apple juice without pulp, clear carbohydrate                 drink such as Clearfast of Gartorade, Black Coffee or Tea (Do not add                 anything to coffee or tea).  __X__2.  On the morning of surgery brush your teeth with toothpaste and water, you                 may rinse your mouth with mouthwash if you wish.  Do not swallow any              toothpaste of mouthwash.     _X__ 3.  No Alcohol for 24 hours before or after surgery.   _X__ 4.  Do Not Smoke or use e-cigarettes For 24 Hours Prior to Your Surgery.                 Do not use any chewable tobacco products for at least 6 hours prior to                 surgery.  ____  5.  Bring all medications with you on the day of surgery if instructed.   __X__  6.  Notify your doctor if there is any change in your medical condition      (cold, fever, infections).     Do not wear jewelry, make-up, hairpins, clips or nail polish. Do not wear lotions, powders, or perfumes. You may wear deodorant. Do not shave 48 hours prior to surgery. Men may shave face and neck. Do not bring valuables to the hospital.    [facility] is not responsible for any belongings or  valuables.  Contacts, dentures or bridgework may not be worn into surgery. Leave your suitcase in the car. After surgery it may be brought to your room. For patients admitted to the hospital, discharge time is determined by your treatment team.   Patients discharged the day of surgery will not be allowed to drive home.   Please read over the following fact sheets that you were given:   Surgical Site Infection Prevention   _X___ Take these medicines the morning of surgery with A SIP OF WATER:    1. ULORIC  2. GABAPENTIN  3. LEFLUNOMIDE  4. LYRICA  5.DETROL  6.  ____ Nile Dear  Enema (as directed)   _X___ Use CHG Soap as directed  ____ Use inhalers on the day of surgery  ____ Stop metformin 2 days prior to surgery    ____ Take 1/2 of usual insulin dose the night before surgery. No insulin the morning          of surgery.   ____ Stop Coumadin/Plavix/aspirin on  ____ Stop Anti-inflammatories on   ____ Stop supplements until after surgery.    ____ Bring C-Pap to the hospital.

## 2017-07-12 ENCOUNTER — Encounter: Payer: Self-pay | Admitting: Anesthesiology

## 2017-07-13 ENCOUNTER — Encounter
Admission: RE | Disposition: A | Discharge status: Skilled Nursing Facility | Payer: Self-pay | Source: Ambulatory Visit | Attending: Specialist

## 2017-07-13 ENCOUNTER — Ambulatory Visit: Payer: Medicare Other | Admitting: Anesthesiology

## 2017-07-13 ENCOUNTER — Other Ambulatory Visit: Payer: Self-pay

## 2017-07-13 ENCOUNTER — Inpatient Hospital Stay
Admission: RE | Admit: 2017-07-13 | Discharge: 2017-07-16 | DRG: 504 | Disposition: A | Discharge status: Skilled Nursing Facility | Payer: Medicare Other | Source: Ambulatory Visit | Attending: Specialist | Admitting: Specialist

## 2017-07-13 DIAGNOSIS — Z79899 Other long term (current) drug therapy: Secondary | ICD-10-CM | POA: Diagnosis not present

## 2017-07-13 DIAGNOSIS — F40240 Claustrophobia: Secondary | ICD-10-CM | POA: Diagnosis present

## 2017-07-13 DIAGNOSIS — G4730 Sleep apnea, unspecified: Secondary | ICD-10-CM | POA: Diagnosis present

## 2017-07-13 DIAGNOSIS — I110 Hypertensive heart disease with heart failure: Secondary | ICD-10-CM | POA: Diagnosis present

## 2017-07-13 DIAGNOSIS — Z4789 Encounter for other orthopedic aftercare: Secondary | ICD-10-CM | POA: Diagnosis not present

## 2017-07-13 DIAGNOSIS — M2550 Pain in unspecified joint: Secondary | ICD-10-CM | POA: Diagnosis present

## 2017-07-13 DIAGNOSIS — M79671 Pain in right foot: Secondary | ICD-10-CM | POA: Diagnosis not present

## 2017-07-13 DIAGNOSIS — I2510 Atherosclerotic heart disease of native coronary artery without angina pectoris: Secondary | ICD-10-CM | POA: Diagnosis present

## 2017-07-13 DIAGNOSIS — G4700 Insomnia, unspecified: Secondary | ICD-10-CM | POA: Diagnosis not present

## 2017-07-13 DIAGNOSIS — R2689 Other abnormalities of gait and mobility: Secondary | ICD-10-CM | POA: Diagnosis not present

## 2017-07-13 DIAGNOSIS — I480 Paroxysmal atrial fibrillation: Secondary | ICD-10-CM | POA: Diagnosis present

## 2017-07-13 DIAGNOSIS — Z9119 Patient's noncompliance with other medical treatment and regimen: Secondary | ICD-10-CM

## 2017-07-13 DIAGNOSIS — G629 Polyneuropathy, unspecified: Secondary | ICD-10-CM | POA: Diagnosis present

## 2017-07-13 DIAGNOSIS — M069 Rheumatoid arthritis, unspecified: Secondary | ICD-10-CM | POA: Diagnosis present

## 2017-07-13 DIAGNOSIS — J449 Chronic obstructive pulmonary disease, unspecified: Secondary | ICD-10-CM | POA: Diagnosis present

## 2017-07-13 DIAGNOSIS — M19071 Primary osteoarthritis, right ankle and foot: Secondary | ICD-10-CM | POA: Diagnosis present

## 2017-07-13 DIAGNOSIS — M25571 Pain in right ankle and joints of right foot: Secondary | ICD-10-CM | POA: Diagnosis not present

## 2017-07-13 DIAGNOSIS — N3281 Overactive bladder: Secondary | ICD-10-CM | POA: Diagnosis not present

## 2017-07-13 DIAGNOSIS — I5032 Chronic diastolic (congestive) heart failure: Secondary | ICD-10-CM | POA: Diagnosis present

## 2017-07-13 DIAGNOSIS — Z85828 Personal history of other malignant neoplasm of skin: Secondary | ICD-10-CM

## 2017-07-13 DIAGNOSIS — I4891 Unspecified atrial fibrillation: Secondary | ICD-10-CM | POA: Diagnosis not present

## 2017-07-13 DIAGNOSIS — Z7901 Long term (current) use of anticoagulants: Secondary | ICD-10-CM | POA: Diagnosis not present

## 2017-07-13 DIAGNOSIS — G8918 Other acute postprocedural pain: Secondary | ICD-10-CM | POA: Diagnosis not present

## 2017-07-13 DIAGNOSIS — I10 Essential (primary) hypertension: Secondary | ICD-10-CM | POA: Diagnosis not present

## 2017-07-13 DIAGNOSIS — Z7401 Bed confinement status: Secondary | ICD-10-CM | POA: Diagnosis not present

## 2017-07-13 DIAGNOSIS — E785 Hyperlipidemia, unspecified: Secondary | ICD-10-CM | POA: Diagnosis present

## 2017-07-13 DIAGNOSIS — M6281 Muscle weakness (generalized): Secondary | ICD-10-CM | POA: Diagnosis not present

## 2017-07-13 DIAGNOSIS — M19171 Post-traumatic osteoarthritis, right ankle and foot: Secondary | ICD-10-CM | POA: Diagnosis not present

## 2017-07-13 DIAGNOSIS — D649 Anemia, unspecified: Secondary | ICD-10-CM | POA: Diagnosis present

## 2017-07-13 DIAGNOSIS — F419 Anxiety disorder, unspecified: Secondary | ICD-10-CM | POA: Diagnosis not present

## 2017-07-13 DIAGNOSIS — Z6841 Body Mass Index (BMI) 40.0 and over, adult: Secondary | ICD-10-CM

## 2017-07-13 HISTORY — PX: FOOT ARTHRODESIS: SHX1655

## 2017-07-13 SURGERY — FUSION, JOINT, FOOT
Anesthesia: General | Laterality: Right | Wound class: "Clean "

## 2017-07-13 MED ORDER — OXYCODONE HCL 5 MG PO TABS
10.0000 mg | ORAL_TABLET | ORAL | Status: DC | PRN
  Administered 2017-07-13 – 2017-07-16 (×11): 10 mg via ORAL
  Filled 2017-07-13 (×11): qty 2

## 2017-07-13 MED ORDER — HYDROXYCHLOROQUINE SULFATE 200 MG PO TABS: 200.0000 mg | ORAL_TABLET | Freq: Two times a day (BID) | ORAL | Status: DC

## 2017-07-13 MED ORDER — ACETAMINOPHEN 650 MG RE SUPP: 650.0000 mg | Freq: Four times a day (QID) | RECTAL | Status: DC | PRN

## 2017-07-13 MED ORDER — MIDAZOLAM HCL 2 MG/2ML IJ SOLN
INTRAMUSCULAR | Status: AC
  Filled 2017-07-13: qty 2

## 2017-07-13 MED ORDER — LIDOCAINE HCL (CARDIAC) PF 100 MG/5ML IV SOSY
PREFILLED_SYRINGE | INTRAVENOUS | Status: DC | PRN
  Administered 2017-07-13: 40 mg via INTRAVENOUS

## 2017-07-13 MED ORDER — CHLORHEXIDINE GLUCONATE 4 % EX LIQD: 60.0000 mL | Freq: Once | CUTANEOUS | Status: DC

## 2017-07-13 MED ORDER — CEFAZOLIN SODIUM-DEXTROSE 2-4 GM/100ML-% IV SOLN
INTRAVENOUS | Status: AC
  Filled 2017-07-13: qty 100

## 2017-07-13 MED ORDER — TERAZOSIN HCL 5 MG PO CAPS
10.0000 mg | ORAL_CAPSULE | Freq: Every day | ORAL | Status: DC
  Administered 2017-07-13 – 2017-07-15 (×3): 10 mg via ORAL
  Filled 2017-07-13 (×4): qty 2

## 2017-07-13 MED ORDER — ADULT MULTIVITAMIN W/MINERALS CH
1.0000 | ORAL_TABLET | Freq: Every day | ORAL | Status: DC
  Administered 2017-07-14 – 2017-07-16 (×3): 1 via ORAL
  Filled 2017-07-13 (×3): qty 1

## 2017-07-13 MED ORDER — FOLIC ACID 1 MG PO TABS
1.0000 mg | ORAL_TABLET | Freq: Every day | ORAL | Status: DC
  Administered 2017-07-14 – 2017-07-16 (×3): 1 mg via ORAL
  Filled 2017-07-13 (×3): qty 1

## 2017-07-13 MED ORDER — HYDROCODONE-ACETAMINOPHEN 5-325 MG PO TABS
1.0000 | ORAL_TABLET | Freq: Four times a day (QID) | ORAL | Status: DC | PRN
  Administered 2017-07-13: 1 via ORAL
  Filled 2017-07-13: qty 1

## 2017-07-13 MED ORDER — ZOLPIDEM TARTRATE 5 MG PO TABS
5.0000 mg | ORAL_TABLET | Freq: Once | ORAL | Status: AC
  Administered 2017-07-13: 5 mg via ORAL
  Filled 2017-07-13: qty 1

## 2017-07-13 MED ORDER — BUPIVACAINE-EPINEPHRINE 0.5% -1:200000 IJ SOLN
INTRAMUSCULAR | Status: DC | PRN
  Administered 2017-07-13: 10 mL

## 2017-07-13 MED ORDER — FENTANYL CITRATE (PF) 100 MCG/2ML IJ SOLN
INTRAMUSCULAR | Status: AC
  Administered 2017-07-13: 50 ug via INTRAVENOUS
  Filled 2017-07-13: qty 2

## 2017-07-13 MED ORDER — EPHEDRINE SULFATE 50 MG/ML IJ SOLN
INTRAMUSCULAR | Status: AC
  Filled 2017-07-13: qty 1

## 2017-07-13 MED ORDER — FESOTERODINE FUMARATE ER 4 MG PO TB24
4.0000 mg | ORAL_TABLET | Freq: Every day | ORAL | Status: DC
  Administered 2017-07-14 – 2017-07-16 (×3): 4 mg via ORAL
  Filled 2017-07-13 (×3): qty 1

## 2017-07-13 MED ORDER — FAMOTIDINE 20 MG PO TABS
ORAL_TABLET | ORAL | Status: AC
  Administered 2017-07-13: 20 mg via ORAL
  Filled 2017-07-13: qty 1

## 2017-07-13 MED ORDER — SODIUM CHLORIDE 0.9% FLUSH
3.0000 mL | Freq: Two times a day (BID) | INTRAVENOUS | Status: DC
  Administered 2017-07-13 – 2017-07-16 (×5): 3 mL via INTRAVENOUS

## 2017-07-13 MED ORDER — FEBUXOSTAT 40 MG PO TABS
40.0000 mg | ORAL_TABLET | Freq: Every day | ORAL | Status: DC
  Administered 2017-07-14 – 2017-07-16 (×3): 40 mg via ORAL
  Filled 2017-07-13 (×3): qty 1

## 2017-07-13 MED ORDER — FENTANYL CITRATE (PF) 100 MCG/2ML IJ SOLN
25.0000 ug | INTRAMUSCULAR | Status: AC | PRN
  Administered 2017-07-13 (×6): 25 ug via INTRAVENOUS

## 2017-07-13 MED ORDER — SALINE SPRAY 0.65 % NA SOLN
1.0000 | NASAL | Status: DC | PRN
  Filled 2017-07-13: qty 44

## 2017-07-13 MED ORDER — BUPIVACAINE HCL (PF) 0.25 % IJ SOLN
INTRAMUSCULAR | Status: AC
  Filled 2017-07-13: qty 30

## 2017-07-13 MED ORDER — OXYCODONE HCL 5 MG PO TABS: 5.0000 mg | ORAL_TABLET | Freq: Four times a day (QID) | ORAL | Status: DC | PRN

## 2017-07-13 MED ORDER — BUPIVACAINE HCL (PF) 0.5 % IJ SOLN
INTRAMUSCULAR | Status: DC | PRN
  Administered 2017-07-13: 2.8 mL

## 2017-07-13 MED ORDER — DIPHENHYDRAMINE HCL 25 MG PO CAPS: 25.0000 mg | ORAL_CAPSULE | Freq: Every day | ORAL | Status: DC | PRN

## 2017-07-13 MED ORDER — MIDAZOLAM HCL 2 MG/2ML IJ SOLN
INTRAMUSCULAR | Status: AC
  Administered 2017-07-13: 1 mg via INTRAVENOUS
  Filled 2017-07-13: qty 2

## 2017-07-13 MED ORDER — LIDOCAINE HCL (PF) 1 % IJ SOLN
INTRAMUSCULAR | Status: AC
  Filled 2017-07-13: qty 30

## 2017-07-13 MED ORDER — ONDANSETRON HCL 4 MG/2ML IJ SOLN
INTRAMUSCULAR | Status: DC | PRN
  Administered 2017-07-13: 4 mg via INTRAVENOUS

## 2017-07-13 MED ORDER — BUPIVACAINE LIPOSOME 1.3 % IJ SUSP
INTRAMUSCULAR | Status: DC | PRN
  Administered 2017-07-13: 20 mL

## 2017-07-13 MED ORDER — FENTANYL CITRATE (PF) 100 MCG/2ML IJ SOLN
INTRAMUSCULAR | Status: DC | PRN
  Administered 2017-07-13: 25 ug via INTRAVENOUS

## 2017-07-13 MED ORDER — BUPIVACAINE HCL (PF) 0.5 % IJ SOLN
INTRAMUSCULAR | Status: AC
  Filled 2017-07-13: qty 30

## 2017-07-13 MED ORDER — SODIUM CHLORIDE 0.9% FLUSH
3.0000 mL | INTRAVENOUS | Status: DC | PRN
Start: 2017-07-13 — End: 2017-07-16

## 2017-07-13 MED ORDER — EPHEDRINE SULFATE 50 MG/ML IJ SOLN
INTRAMUSCULAR | Status: DC | PRN
  Administered 2017-07-13: 10 mg via INTRAVENOUS
  Administered 2017-07-13 (×2): 5 mg via INTRAVENOUS

## 2017-07-13 MED ORDER — BUPIVACAINE LIPOSOME 1.3 % IJ SUSP
INTRAMUSCULAR | Status: AC
  Filled 2017-07-13: qty 20

## 2017-07-13 MED ORDER — ROPIVACAINE HCL 5 MG/ML IJ SOLN
INTRAMUSCULAR | Status: AC
  Filled 2017-07-13: qty 30

## 2017-07-13 MED ORDER — DEXAMETHASONE SODIUM PHOSPHATE 10 MG/ML IJ SOLN
INTRAMUSCULAR | Status: AC
  Filled 2017-07-13: qty 1

## 2017-07-13 MED ORDER — TORSEMIDE 20 MG PO TABS: 20.0000 mg | ORAL_TABLET | Freq: Two times a day (BID) | ORAL | Status: DC | PRN

## 2017-07-13 MED ORDER — PREGABALIN 75 MG PO CAPS
150.0000 mg | ORAL_CAPSULE | Freq: Three times a day (TID) | ORAL | Status: DC
  Administered 2017-07-13 – 2017-07-16 (×9): 150 mg via ORAL
  Filled 2017-07-13 (×9): qty 2

## 2017-07-13 MED ORDER — MORPHINE SULFATE (PF) 4 MG/ML IV SOLN: 2.0000 mg | INTRAVENOUS | Status: DC | PRN

## 2017-07-13 MED ORDER — ONDANSETRON HCL 4 MG/2ML IJ SOLN
INTRAMUSCULAR | Status: AC
  Filled 2017-07-13: qty 2

## 2017-07-13 MED ORDER — FENTANYL CITRATE (PF) 100 MCG/2ML IJ SOLN
50.0000 ug | Freq: Once | INTRAMUSCULAR | Status: AC
  Administered 2017-07-13: 50 ug via INTRAVENOUS

## 2017-07-13 MED ORDER — ATORVASTATIN CALCIUM 20 MG PO TABS
20.0000 mg | ORAL_TABLET | Freq: Every evening | ORAL | Status: DC
  Administered 2017-07-13 – 2017-07-15 (×3): 20 mg via ORAL
  Filled 2017-07-13 (×3): qty 1

## 2017-07-13 MED ORDER — LIDOCAINE HCL (PF) 2 % IJ SOLN
INTRAMUSCULAR | Status: AC
  Filled 2017-07-13: qty 10

## 2017-07-13 MED ORDER — FENTANYL CITRATE (PF) 100 MCG/2ML IJ SOLN
INTRAMUSCULAR | Status: AC
  Filled 2017-07-13: qty 2

## 2017-07-13 MED ORDER — PROPOFOL 500 MG/50ML IV EMUL
INTRAVENOUS | Status: AC
  Filled 2017-07-13: qty 50

## 2017-07-13 MED ORDER — FAMOTIDINE 20 MG PO TABS
20.0000 mg | ORAL_TABLET | Freq: Once | ORAL | Status: AC
  Administered 2017-07-13: 20 mg via ORAL

## 2017-07-13 MED ORDER — MONTELUKAST SODIUM 10 MG PO TABS
10.0000 mg | ORAL_TABLET | Freq: Every day | ORAL | Status: DC
  Administered 2017-07-13 – 2017-07-15 (×3): 10 mg via ORAL
  Filled 2017-07-13 (×3): qty 1

## 2017-07-13 MED ORDER — AMLODIPINE BESYLATE 5 MG PO TABS
5.0000 mg | ORAL_TABLET | Freq: Every day | ORAL | Status: DC
  Administered 2017-07-14 – 2017-07-16 (×3): 5 mg via ORAL
  Filled 2017-07-13 (×3): qty 1

## 2017-07-13 MED ORDER — ACETAMINOPHEN 325 MG PO TABS
650.0000 mg | ORAL_TABLET | Freq: Four times a day (QID) | ORAL | Status: DC | PRN
Start: 2017-07-13 — End: 2017-07-16
  Filled 2017-07-13: qty 2

## 2017-07-13 MED ORDER — ONDANSETRON HCL 4 MG/2ML IJ SOLN: 4.0000 mg | Freq: Once | INTRAMUSCULAR | Status: DC | PRN

## 2017-07-13 MED ORDER — FENTANYL CITRATE (PF) 100 MCG/2ML IJ SOLN
INTRAMUSCULAR | Status: AC
  Administered 2017-07-13: 25 ug via INTRAVENOUS
  Filled 2017-07-13: qty 2

## 2017-07-13 MED ORDER — BISACODYL 10 MG RE SUPP
10.0000 mg | Freq: Every day | RECTAL | Status: DC | PRN
  Filled 2017-07-13: qty 1

## 2017-07-13 MED ORDER — BUPIVACAINE-EPINEPHRINE (PF) 0.5% -1:200000 IJ SOLN
INTRAMUSCULAR | Status: AC
  Filled 2017-07-13: qty 30

## 2017-07-13 MED ORDER — SEVOFLURANE IN SOLN
RESPIRATORY_TRACT | Status: AC
  Filled 2017-07-13: qty 250

## 2017-07-13 MED ORDER — METOPROLOL SUCCINATE ER 50 MG PO TB24
50.0000 mg | ORAL_TABLET | Freq: Every evening | ORAL | Status: DC
  Administered 2017-07-14 – 2017-07-15 (×2): 50 mg via ORAL
  Filled 2017-07-13 (×3): qty 1

## 2017-07-13 MED ORDER — PROPOFOL 500 MG/50ML IV EMUL
INTRAVENOUS | Status: DC | PRN
  Administered 2017-07-13: 35 ug/kg/min via INTRAVENOUS

## 2017-07-13 MED ORDER — LACTATED RINGERS IV SOLN
INTRAVENOUS | Status: DC
  Administered 2017-07-13: 14:00:00 via INTRAVENOUS
  Administered 2017-07-13: 75 mL/h via INTRAVENOUS

## 2017-07-13 MED ORDER — DOCUSATE SODIUM 100 MG PO CAPS
100.0000 mg | ORAL_CAPSULE | Freq: Two times a day (BID) | ORAL | Status: DC
  Administered 2017-07-13 – 2017-07-16 (×6): 100 mg via ORAL
  Filled 2017-07-13 (×6): qty 1

## 2017-07-13 MED ORDER — SUCCINYLCHOLINE CHLORIDE 20 MG/ML IJ SOLN
INTRAMUSCULAR | Status: AC
  Filled 2017-07-13: qty 1

## 2017-07-13 MED ORDER — MIDAZOLAM HCL 2 MG/2ML IJ SOLN
1.0000 mg | Freq: Once | INTRAMUSCULAR | Status: AC
  Administered 2017-07-13: 1 mg via INTRAVENOUS

## 2017-07-13 MED ORDER — ONDANSETRON HCL 4 MG/2ML IJ SOLN: 4.0000 mg | Freq: Four times a day (QID) | INTRAMUSCULAR | Status: DC | PRN

## 2017-07-13 MED ORDER — SODIUM CHLORIDE 0.9 % IV SOLN: 250.0000 mL | INTRAVENOUS | Status: DC | PRN

## 2017-07-13 MED ORDER — MORPHINE SULFATE (PF) 2 MG/ML IV SOLN
2.0000 mg | INTRAVENOUS | Status: DC | PRN
Start: 2017-07-13 — End: 2017-07-14
  Administered 2017-07-13 (×2): 2 mg via INTRAVENOUS
  Filled 2017-07-13 (×2): qty 1

## 2017-07-13 MED ORDER — CEFAZOLIN SODIUM-DEXTROSE 2-4 GM/100ML-% IV SOLN
2.0000 g | INTRAVENOUS | Status: AC
  Administered 2017-07-13: 2 g via INTRAVENOUS

## 2017-07-13 MED ORDER — PHENYLEPHRINE HCL 10 MG/ML IJ SOLN
INTRAMUSCULAR | Status: AC
  Filled 2017-07-13: qty 1

## 2017-07-13 MED ORDER — POTASSIUM CHLORIDE CRYS ER 10 MEQ PO TBCR
10.0000 meq | EXTENDED_RELEASE_TABLET | Freq: Every day | ORAL | Status: DC
  Administered 2017-07-14 – 2017-07-16 (×3): 10 meq via ORAL
  Filled 2017-07-13 (×3): qty 1

## 2017-07-13 MED ORDER — ENOXAPARIN SODIUM 40 MG/0.4ML ~~LOC~~ SOLN
40.0000 mg | SUBCUTANEOUS | Status: DC
  Administered 2017-07-14: 40 mg via SUBCUTANEOUS
  Filled 2017-07-13 (×2): qty 0.4

## 2017-07-13 MED ORDER — POLYETHYLENE GLYCOL 3350 17 G PO PACK
17.0000 g | PACK | Freq: Every day | ORAL | Status: DC | PRN
  Administered 2017-07-15: 17 g via ORAL
  Filled 2017-07-13: qty 1

## 2017-07-13 MED ORDER — LACTATED RINGERS IV SOLN
INTRAVENOUS | Status: DC
  Administered 2017-07-13 (×2): via INTRAVENOUS

## 2017-07-13 MED ORDER — SODIUM FLUORIDE 1.1 % DT CREA: 1.0000 "application " | TOPICAL_CREAM | Freq: Two times a day (BID) | DENTAL | Status: DC

## 2017-07-13 MED ORDER — ROPIVACAINE HCL 5 MG/ML IJ SOLN
INTRAMUSCULAR | Status: DC | PRN
  Administered 2017-07-13: 20 mL via EPIDURAL

## 2017-07-13 MED ORDER — POVIDONE-IODINE 7.5 % EX SOLN: Freq: Once | CUTANEOUS | Status: DC

## 2017-07-13 MED ORDER — MIDAZOLAM HCL 2 MG/2ML IJ SOLN
INTRAMUSCULAR | Status: DC | PRN
  Administered 2017-07-13: 1 mg via INTRAVENOUS

## 2017-07-13 MED ORDER — ONDANSETRON HCL 4 MG PO TABS: 4.0000 mg | ORAL_TABLET | Freq: Four times a day (QID) | ORAL | Status: DC | PRN

## 2017-07-13 MED ORDER — LEFLUNOMIDE 20 MG PO TABS
20.0000 mg | ORAL_TABLET | ORAL | Status: DC
  Administered 2017-07-14 – 2017-07-16 (×3): 20 mg via ORAL
  Filled 2017-07-13 (×3): qty 1

## 2017-07-13 SURGICAL SUPPLY — 81 items
"PENCIL ELECTRO HAND CTR " (MISCELLANEOUS) ×1 IMPLANT
BANDAGE ELASTIC 4 LF NS (GAUZE/BANDAGES/DRESSINGS) ×2 IMPLANT
BIT DRILL 2 FENESTRATED (MISCELLANEOUS) IMPLANT
BIT DRILL SOLID 2.0 X 110MM (DRILL) IMPLANT
BIT DRILLL 2 FENESTRATED (MISCELLANEOUS) ×1
BLADE MED AGGRESSIVE (BLADE) ×2 IMPLANT
BLADE OSC/SAGITTAL MD 5.5X18 (BLADE) ×2 IMPLANT
BLADE SURG 15 STRL LF DISP TIS (BLADE) IMPLANT
BLADE SURG 15 STRL SS (BLADE) ×3
BLADE SURG MINI STRL (BLADE) ×2 IMPLANT
BNDG CMPR MED 5X4 ELC HKLP NS (GAUZE/BANDAGES/DRESSINGS) ×1
BNDG COHESIVE 4X5 TAN STRL (GAUZE/BANDAGES/DRESSINGS) ×1 IMPLANT
BNDG CONFORM 3 STRL LF (GAUZE/BANDAGES/DRESSINGS) ×3 IMPLANT
BNDG ESMARK 4X12 TAN STRL LF (GAUZE/BANDAGES/DRESSINGS) ×2 IMPLANT
BNDG GAUZE 4.5X4.1 6PLY STRL (MISCELLANEOUS) ×2 IMPLANT
BONE STAPLE NITINOL 15X15X15 (Miscellaneous) ×2 IMPLANT
BUR 4X45 EGG (BURR) ×1 IMPLANT
CANISTER SUCT 1200ML W/VALVE (MISCELLANEOUS) ×2 IMPLANT
CNTNR SPEC 2.5X3XGRAD LEK (MISCELLANEOUS) ×1
CONT SPEC 4OZ STER OR WHT (MISCELLANEOUS) ×1
CONT SPEC 4OZ STRL OR WHT (MISCELLANEOUS) ×1
CONTAINER SPEC 2.5X3XGRAD LEK (MISCELLANEOUS) IMPLANT
COVER PIN YLW 0.028-062 (MISCELLANEOUS) ×8 IMPLANT
CUFF TOURN 18 STER (MISCELLANEOUS) ×2 IMPLANT
CUFF TOURN DUAL PL 12 NO SLV (MISCELLANEOUS) ×2 IMPLANT
DRAPE FLUOR MINI C-ARM 54X84 (DRAPES) ×2 IMPLANT
DRILL SOLID 2.0 X 110MM (DRILL) ×2
DURAPREP 26ML APPLICATOR (WOUND CARE) ×2 IMPLANT
ELECT REM PT RETURN 9FT ADLT (ELECTROSURGICAL) ×2
ELECTRODE REM PT RTRN 9FT ADLT (ELECTROSURGICAL) ×1 IMPLANT
GAUZE PETRO XEROFOAM 1X8 (MISCELLANEOUS) ×3 IMPLANT
GAUZE SPONGE 4X4 12PLY STRL (GAUZE/BANDAGES/DRESSINGS) ×3 IMPLANT
GLOVE BIO SURGEON STRL SZ8 (GLOVE) ×2 IMPLANT
GLOVE INDICATOR 8.0 STRL GRN (GLOVE) ×2 IMPLANT
GOWN STRL REUS W/ TWL LRG LVL3 (GOWN DISPOSABLE) ×2 IMPLANT
GOWN STRL REUS W/TWL LRG LVL3 (GOWN DISPOSABLE) ×2
GRAFT TRIN ELITE MED MUSC TRAN (Graft) ×2 IMPLANT
IMPL SINGLE PACK 20X20X20 (Staple) IMPLANT
IMPLANT SINGLE PACK 20X20X20 (Staple) ×6 IMPLANT
K-WIRE SMOOTH TROCAR 2.0X150 (WIRE) ×4
KIT IMPLANT DYNAFORCE 15X18X18 (Staple) ×3 IMPLANT
KIT STAPLE BONE HIMAX 15X15X15 (Miscellaneous) IMPLANT
KIT TURNOVER KIT A (KITS) ×2 IMPLANT
KWIRE SMOOTH TROCAR 2.0X150 (WIRE) IMPLANT
LABEL OR SOLS (LABEL) ×2 IMPLANT
NDL FILTER BLUNT 18X1 1/2 (NEEDLE) ×1 IMPLANT
NDL HYPO 25X1 1.5 SAFETY (NEEDLE) ×1 IMPLANT
NEEDLE FILTER BLUNT 18X 1/2SAF (NEEDLE) ×1
NEEDLE FILTER BLUNT 18X1 1/2 (NEEDLE) ×1 IMPLANT
NEEDLE HYPO 25X1 1.5 SAFETY (NEEDLE) ×2 IMPLANT
NS IRRIG 500ML POUR BTL (IV SOLUTION) ×2 IMPLANT
PACK EXTREMITY ARMC (MISCELLANEOUS) ×2 IMPLANT
PAD PREP 24X41 OB/GYN DISP (PERSONAL CARE ITEMS) ×2 IMPLANT
PENCIL ELECTRO HAND CTR (MISCELLANEOUS) ×2 IMPLANT
PLATE 28 4 HOLE LOCKING (Plate) ×1 IMPLANT
PLATE STRAIGHT 4-HOLE (Plate) ×1 IMPLANT
RASP SM TEAR CROSS CUT (RASP) ×2 IMPLANT
SCREW LOCK 3 3.5X18 (Screw) ×1 IMPLANT
SCREW LOCK PLATE R3 2.7X16 ×1 IMPLANT
SCREW LOCK PLATE R3 2.7X18 (Screw) ×2 IMPLANT
SCREW LOCK PLATE R3 2.7X20 ×1 IMPLANT
SCREW LOCK PLATE R3 2.7X28 (Screw) ×3 IMPLANT
SCREW LOCK PLATE R3 2.7X28 GRL (Screw) IMPLANT
SCREW NLOCK 40X2.7XNS GORILLA (Screw) IMPLANT
SCREW NON-LOCKING 2.7X40 (Screw) ×1 IMPLANT
SET YANKAUER POOLE SUCT (MISCELLANEOUS) ×1 IMPLANT
SPLINT FAST PLASTER 5X30 (CAST SUPPLIES) ×1
SPLINT PLASTER CAST FAST 5X30 (CAST SUPPLIES) ×1 IMPLANT
SPONGE XRAY 4X4 16PLY STRL (MISCELLANEOUS) ×3 IMPLANT
STAPLE BONE 20X20X20 (Staple) ×2 IMPLANT
STOCKINETTE STRL 6IN 960660 (GAUZE/BANDAGES/DRESSINGS) ×2 IMPLANT
STRIP CLOSURE SKIN 1/4X4 (GAUZE/BANDAGES/DRESSINGS) ×3 IMPLANT
SUT ETHILON 5-0 FS-2 18 BLK (SUTURE) ×2 IMPLANT
SUT VIC AB 3-0 SH 27 (SUTURE) ×2
SUT VIC AB 3-0 SH 27X BRD (SUTURE) IMPLANT
SUT VIC AB 4-0 FS2 27 (SUTURE) ×2 IMPLANT
SYR 10ML LL (SYRINGE) ×2 IMPLANT
SYR 20CC LL (SYRINGE) ×5 IMPLANT
WIRE OLIVE SMOOTH 1.4MMX60MM (WIRE) ×1 IMPLANT
WIRE Z .045 C-WIRE SPADE TIP (WIRE) ×4 IMPLANT
WIRE Z .062 C-WIRE SPADE TIP (WIRE) ×4 IMPLANT

## 2017-07-13 NOTE — Anesthesia Preprocedure Evaluation (Addendum)
Anesthesia Evaluation  Patient identified by MRN, date of birth, ID band Patient awake    Reviewed: Allergy & Precautions, NPO status , Patient's Chart, lab work & pertinent test results, reviewed documented beta blocker date and time   Airway Mallampati: III  TM Distance: >3 FB     Dental  (+) Chipped, Missing   Pulmonary asthma , sleep apnea , COPD,           Cardiovascular hypertension, Pt. on medications and Pt. on home beta blockers + CAD  + dysrhythmias Atrial Fibrillation      Neuro/Psych  Headaches, Anxiety    GI/Hepatic   Endo/Other  Morbid obesity  Renal/GU      Musculoskeletal  (+) Arthritis ,   Abdominal   Peds  Hematology  (+) anemia ,   Anesthesia Other Findings Echo ok. Heart and lungs OK.  Reproductive/Obstetrics                            Anesthesia Physical Anesthesia Plan  ASA: III  Anesthesia Plan: General   Post-op Pain Management:    Induction: Intravenous  PONV Risk Score and Plan:   Airway Management Planned: LMA  Additional Equipment:   Intra-op Plan:   Post-operative Plan:   Informed Consent: I have reviewed the patients History and Physical, chart, labs and discussed the procedure including the risks, benefits and alternatives for the proposed anesthesia with the patient or authorized representative who has indicated his/her understanding and acceptance.     Plan Discussed with: CRNA  Anesthesia Plan Comments:         Anesthesia Quick Evaluation

## 2017-07-13 NOTE — Anesthesia Procedure Notes (Signed)
Anesthesia Regional Block: Popliteal block   Pre-Anesthetic Checklist: ,, timeout performed, Correct Patient, Correct Site, Correct Laterality, Correct Procedure, Correct Position, site marked, Risks and benefits discussed,  Surgical consent,  Pre-op evaluation,  At surgeon's request and post-op pain management  Laterality: Lower  Prep: chloraprep       Needles:  Injection technique: Single-shot  Needle Type: Echogenic Needle     Needle Length: 9cm  Needle Gauge: 21     Additional Needles:   Procedures:,,,, ultrasound used (permanent image in chart),,,,  Narrative:  Injection made incrementally with aspirations every 5 mL.  Performed by: Personally  Anesthesiologist: Piscitello, Joseph K, MD  Additional Notes: Functioning IV was confirmed and monitors were applied.  A echogenic needle was used. Sterile prep,hand hygiene and sterile gloves were used. Minimal sedation used for procedure.   No paresthesia endorsed by patient during the procedure.  Negative aspiration and negative test dose prior to incremental administration of local anesthetic. The patient tolerated the procedure well with no immediate complications.      

## 2017-07-13 NOTE — OR Nursing (Signed)
Dr Elvina Mattes in to see patient, assessed area on right leg where boot she was wearing rubbed. Area scabbed over with s/s of infection.  Dr Marcello Moores assessed heart and lung this am.

## 2017-07-13 NOTE — Consult Note (Signed)
[facility]                                                      Patient Demographics  Tina Pacheco, is a 67 y.o. female   MRN: [id number]   DOB - Aug 13, 1950  Admit Date - 07/13/2017    Outpatient Primary MD for the patient is Venia Carbon, MD  Consult requested in the Hospital by Salary, Avel Peace, MD, On 07/13/2017    Reason for consult follow-up after right foot surgery   With History of -  Past Medical History:  Diagnosis Date  . Arthritis    RA  . Asthma   . Basal cell carcinoma   . Cataract 2018   bilateral eyes; corrected with surgery  . Claustrophobia   . COPD (chronic obstructive pulmonary disease) ([place])   . Diastolic dysfunction    a. echo 07/2014: EF 55-60%, no RWMA, GR2DD, mild MR, LA moderately dilated, PASP 38 mm Hg  . Dyslipidemia   . Dysrhythmia   . Headache    migraines  . Hemihypertrophy   . History of cardiac cath    a. 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 History of gout   . Hyperplastic colonic polyp 2003  . Hypertension   . Hypokalemia   . Morbid obesity ([place])   . PAF (paroxysmal atrial fibrillation) (HCC)    a. on Pradaxa; b. CHADSVASc at least 2 (HTN & female)  . Rheumatoid arthritis(714.0)   . Sleep apnea    a. not compliant with CPAP      Past Surgical History:  Procedure Laterality Date  . ABDOMINAL HYSTERECTOMY    . CARDIAC CATHETERIZATION  05/24/2010   nonobstructive CAD  . CATARACT EXTRACTION W/ INTRAOCULAR LENS IMPLANT Right 06/12/2016   Dr. Darleen Crocker  . CATARACT EXTRACTION W/ INTRAOCULAR LENS IMPLANT Left 06/26/2016   Dr. Darleen Crocker  . CESAREAN SECTION    . CHOLECYSTECTOMY    . COLONOSCOPY  06/12/2011   Procedure: COLONOSCOPY;  Surgeon: Juanita Craver, MD;  Location: [facility];  Service: Endoscopy;  Laterality: N/A;  . COLONOSCOPY N/A 03/17/2013   Procedure: COLONOSCOPY;   Surgeon: Juanita Craver, MD;  Location: [facility];  Service: Endoscopy;  Laterality: N/A;  . EYE SURGERY    . KNEE ARTHROSCOPY     bilateral  . SINUSOTOMY    . TOOTH EXTRACTION  12/2016  . VAGINAL HYSTERECTOMY      in for   No chief complaint on file.    HPI  Tina Pacheco  is a 67 y.o. female, patient was admitted this morning after surgery due to pain following extensive right foot surgery.  She underwent fusion of 6 different joints in the right foot this morning that required significant dissection.  She currently states that she is having severe pain at a level 8.  Oral medicines are not particularly working well.    Review of Systems: Patient is alert well oriented.  She denies any fevers she states she is cold but her temperature and vital signs within normal limits.  In addition to the HPI above,  No Fever-chills, No Headache, No changes with Vision or hearing, No problems swallowing food or Liquids, No Chest pain, Cough or Shortness of Breath, No Abdominal pain, No Nausea or Vommitting, Bowel movements are regular, No Blood  in stool or Urine, No dysuria, No new skin rashes or bruises, No new joints pains-aches,  No new weakness, tingling, numbness in any extremity, No recent weight gain or loss, No polyuria, polydypsia or polyphagia, No significant Mental Stressors.  A full 10 point Review of Systems was done, except as stated above, all other Review of Systems were negative.   Social History Social History   Tobacco Use  . Smoking status: Never Smoker  . Smokeless tobacco: Never Used  Substance Use Topics  . Alcohol use: No    Family History Family History  Problem Relation Age of Onset  . Emphysema Father        smoked  . Tuberculosis Mother   . Parkinsonism Mother   . Diabetes type II Sister   . Breast cancer Sister   . Breast cancer Maternal Aunt   . Tuberculosis Sister     Prior to Admission medications   Medication Sig Start Date End Date  Taking? Authorizing Provider  amLODipine (NORVASC) 5 MG tablet TAKE 1 TABLET BY MOUTH ONCE DAILY Patient taking differently: Take 5 mgs by mouth once daily in the evening 05/28/17  Yes Venia Carbon, MD  atorvastatin (LIPITOR) 20 MG tablet Take 1 tablet (20 mg total) by mouth daily at 6 PM. Patient taking differently: Take 20 mg by mouth every evening.  05/16/17  Yes Venia Carbon, MD  diphenhydrAMINE (BENADRYL) 25 MG tablet Take 25 mg by mouth daily as needed for allergies.   Yes [provider]  febuxostat (ULORIC) 40 MG tablet Take 40 mg by mouth daily.   Yes [provider]  folic acid (FOLVITE) 1 MG tablet Take 1 mg by mouth daily.   Yes [provider]  hydroxychloroquine (PLAQUENIL) 200 MG tablet Take 200 mg by mouth 2 (two) times daily.    Yes [provider]  KLOR-CON M20 20 MEQ tablet TAKE 1/2 TABLET (=10MEQ)   TWO TIMES A DAY ; MAY TAKE AND EXTRA TABLET WHEN      TAKING TORSEMIDE 01/29/17  Yes Gollan, Kathlene November, MD  leflunomide (ARAVA) 20 MG tablet Take 20 mg by mouth every morning.  09/20/11  Yes [provider]  metoprolol succinate (TOPROL-XL) 50 MG 24 hr tablet Take 1 tablet (50 mg total) by mouth daily. Take with or immediately following a meal. Patient taking differently: Take 50 mg by mouth every evening. Take with or immediately following a meal. 11/16/16  Yes Lucille Passy, MD  montelukast (SINGULAIR) 10 MG tablet Take 1 tablet (10 mg total) by mouth at bedtime. 11/16/16  Yes Lucille Passy, MD  Multiple Vitamin (MULTIVITAMIN) tablet Take 1 tablet by mouth daily.    Yes [provider]  oxyCODONE (ROXICODONE) 5 MG immediate release tablet Take 2 tablets by mouth every 12 hours 07/02/17  Yes Tower, Wynelle Fanny, MD  PRADAXA 150 MG CAPS capsule TAKE 1 CAPSULE TWICE DAILY 05/28/17  Yes Gollan, Kathlene November, MD  pregabalin (LYRICA) 150 MG capsule Take 150 mg by mouth 3 (three) times daily.   Yes [provider]  terazosin (HYTRIN)  10 MG capsule TAKE 1 CAPSULE BY MOUTH AT BEDTIME 05/16/17  Yes Viviana Simpler I, MD  tolterodine (DETROL LA) 2 MG 24 hr capsule Take 1 capsule (2 mg total) by mouth daily. Patient taking differently: Take 2 mg by mouth daily as needed (bladder spasms).  02/02/16  Yes Lucille Passy, MD  torsemide (DEMADEX) 20 MG tablet take 1 tablet by  mouth twice a day for edema Patient taking differently: Take 20 mg by mouth twice daily as needed for swelling 07/28/16  Yes Lucille Passy, MD  ibuprofen (ADVIL,MOTRIN) 200 MG tablet Take 400-600 mg by mouth 2 (two) times daily as needed.    [provider]  SF 5000 PLUS 1.1 % CREA dental cream Place 1 application onto teeth 2 (two) times daily. 06/01/17   [provider]  sodium chloride (OCEAN) 0.65 % SOLN nasal spray Place 1 spray into both nostrils as needed for congestion.    [provider]    Anti-infectives (From admission, onward)   Start     Dose/Rate Route Frequency Ordered Stop   07/13/17 1400  hydroxychloroquine (PLAQUENIL) tablet 200 mg  Status:  Discontinued     200 mg Oral 2 times daily 07/13/17 1315 07/13/17 1617   07/13/17 0615  ceFAZolin (ANCEF) IVPB 2g/100 mL premix     2 g 200 mL/hr over 30 Minutes Intravenous On call to O.R. 07/13/17 0604 07/13/17 0808   07/13/17 0610  ceFAZolin (ANCEF) 2-4 GM/100ML-% IVPB    Note to Pharmacy:  Dewayne Hatch   : cabinet override      07/13/17 0610 07/13/17 0758      Scheduled Meds: . amLODipine  5 mg Oral Daily  . atorvastatin  20 mg Oral QPM  . docusate sodium  100 mg Oral BID  . enoxaparin (LOVENOX) injection  40 mg Subcutaneous Q24H  . [START ON 07/14/2017] febuxostat  40 mg Oral Daily  . [START ON 07/14/2017] fesoterodine  4 mg Oral Daily  . folic acid  1 mg Oral Daily  . [START ON 07/14/2017] leflunomide  20 mg Oral BH-q7a  . metoprolol succinate  50 mg Oral QPM  . montelukast  10 mg Oral QHS  . multivitamin with minerals  1 tablet Oral Daily  . potassium chloride SA  10  mEq Oral Daily  . pregabalin  150 mg Oral TID  . sodium chloride flush  3 mL Intravenous Q12H  . terazosin  10 mg Oral QHS   Continuous Infusions: . sodium chloride    . lactated ringers 75 mL/hr at 07/13/17 1405   PRN Meds:.sodium chloride, acetaminophen **OR** acetaminophen, bisacodyl, diphenhydrAMINE, HYDROcodone-acetaminophen, morphine injection, ondansetron **OR** ondansetron (ZOFRAN) IV, oxyCODONE, polyethylene glycol, sodium chloride, sodium chloride flush, torsemide  No Known Allergies  Physical Exam  Vitals  Blood pressure (!) 126/49, pulse (!) 59, temperature (!) 97.5 F (36.4 C), temperature source Oral, resp. rate 18, weight 131.5 kg (290 lb), SpO2 100 %.  Lower Extremity exam:  Vascular: DP pulses are +2/4 bilateral  Dermatological: She has had no wounds.  She is postop  Neurological: Normal sensation to both lower extremities  Ortho: Status post right foot surgery today  Data Review  CBC Recent Labs  Lab 07/09/17 0810  WBC 4.8  HGB 12.9  HCT 38.8  PLT 155  MCV 87.2  MCH 28.9  MCHC 33.2  RDW 16.0*   ------------------------------------------------------------------------------------------------------------------  Chemistries  Recent Labs  Lab 07/09/17 0810  NA 138  K 3.6  CL 101  CO2 29  GLUCOSE 86  BUN 27*  CREATININE 1.06*  CALCIUM 9.9   ------------------------------------------------------------------------------------------------------------------ estimated creatinine clearance is 70.6 mL/min (A) (by C-G formula based on SCr of 1.06 mg/dL (H)). ------------------------------------------------------------------------------------------------------------------ No results for input(s): TSH, T4TOTAL, T3FREE, THYROIDAB in the last 72 hours.  Invalid input(s): FREET3 Urinalysis    Component Value Date/Time   COLORURINE YELLOW (A) 08/09/2014 1951  APPEARANCEUR CLEAR (A) 08/09/2014 1951   APPEARANCEUR Cloudy 03/28/2014 1033   LABSPEC  1.012 08/09/2014 1951   LABSPEC 1.018 03/28/2014 1033   PHURINE 6.0 08/09/2014 1951   GLUCOSEU NEGATIVE 08/09/2014 1951   GLUCOSEU Negative 03/28/2014 1033   HGBUR 1+ (A) 08/09/2014 1951   BILIRUBINUR NEGATIVE 08/09/2014 1951   BILIRUBINUR Negative 03/28/2014 1033   [place] 08/09/2014 1951   PROTEINUR 30 (A) 08/09/2014 1951   NITRITE NEGATIVE 08/09/2014 1951   LEUKOCYTESUR NEGATIVE 08/09/2014 1951   LEUKOCYTESUR 3+ 03/28/2014 1033     Imaging results:   No results found.  Assessment & Plan: I explained to the patient the reason for admission and will have to monitor her progress with pain over the next several days.  She also has to be completely nonweightbearing and will need physical therapy if she can tolerate that.  Active Problems:   Acute pain in joint   Family Communication: Plan discussed with patient   Albertine Patricia M.D on 07/13/2017 at 4:17 PM  Thank you for the consult, we will follow the patient with you in the Hospital.

## 2017-07-13 NOTE — NC FL2 (Signed)
[place] LEVEL OF CARE SCREENING TOOL     IDENTIFICATION  Patient Name: Tina Pacheco Birthdate: [birth year] Sex: female Admission Date (Current Location): 07/13/2017  [place] and [place] Number:  Engineering geologist and Address:  [facility], [address], [place], [place] [address]      Provider Number: [id number]  Attending Physician Name and Address:  Gorden Harms, MD  Relative Name and Phone Number:       Current Level of Care: Hospital Recommended Level of Care: [place] Prior Approval Number:    Date Approved/Denied:   PASRR Number: [id number]  Discharge Plan: SNF    Current Diagnoses: Patient Active Problem List   Diagnosis Date Noted  . Acute pain in joint 07/13/2017  . Preop examination 07/06/2017  . Chronic diastolic heart failure ([name]) 07/06/2017  . Nausea 05/08/2017  . Narcotic dependence ([place]) 03/09/2017  . Vertigo 06/28/2016  . Bronchiectasis without acute exacerbation ([place]) 06/15/2015  . DDD (degenerative disc disease), lumbosacral 03/30/2015  . Anemia 09/08/2014  . Sleep apnea   . Coronary artery disease, non-occlusive 08/25/2014  . PAF (paroxysmal atrial fibrillation) ([place]) 08/25/2014  . 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 RLS (restless legs syndrome) 07/06/2014  . Gout 05/26/2014  . Chronic pain syndrome 05/26/2014  . Migraine 04/08/2014  . Acquired left calf asymmetry 01/05/2014  . Allergic conjunctivitis 09/16/2013  . Back pain 07/22/2013  . Metatarsalgia of right foot 07/22/2013  . Insomnia 07/15/2013  . OA (osteoarthritis) 11/20/2012  . DJD (degenerative joint disease) of cervical spine 09/10/2012  . Chronic cough 08/17/2011  . Allergic rhinitis 07/10/2011  . Rheumatoid arthritis ([place]) 03/06/2011  . Severe obesity (BMI >= 40) ([place]) 06/22/2010  . Hyperlipidemia 06/22/2010  . HTN (hypertension) 06/22/2010  . Diastolic dysfunction 56/25/6389  . Long  term current use of anticoagulant therapy 05/27/2010    Orientation RESPIRATION BLADDER Height & Weight     Self, Time, Situation, Place  O2(2 Liters ) Continent Weight: 290 lb (131.5 kg) Height:     BEHAVIORAL SYMPTOMS/MOOD NEUROLOGICAL BOWEL NUTRITION STATUS  (none) (none) Continent Diet(Heart Healthy )  AMBULATORY STATUS COMMUNICATION OF NEEDS Skin   Extensive Assist Verbally Surgical wounds(Right Foot )                       Personal Care Assistance Level of Assistance  Bathing, Feeding, Dressing Bathing Assistance: Limited assistance Feeding assistance: Independent Dressing Assistance: Limited assistance     Functional Limitations Info  Sight, Hearing, Speech Sight Info: Adequate Hearing Info: Adequate Speech Info: Adequate    SPECIAL CARE FACTORS FREQUENCY  PT (By licensed PT), OT (By licensed OT)     PT Frequency: 5x/week OT Frequency: 5x/week             Contractures Contractures Info: Not present    Additional Factors Info  Code Status, Allergies Code Status Info: Full code  Allergies Info: NKA           Current Medications (07/13/2017):  This is the current hospital active medication list Current Facility-Administered Medications  Medication Dose Route Frequency Provider Last Rate Last Dose  . 0.9 %  sodium chloride infusion  250 mL Intravenous PRN Salary, Montell D, MD      . acetaminophen (TYLENOL) tablet 650 mg  650 mg Oral Q6H PRN Salary, Montell D, MD       Or  . acetaminophen (TYLENOL) suppository 650 mg  650 mg  Rectal Q6H PRN Salary, Montell D, MD      . amLODipine (NORVASC) tablet 5 mg  5 mg Oral Daily Salary, Montell D, MD      . atorvastatin (LIPITOR) tablet 20 mg  20 mg Oral QPM Salary, Montell D, MD      . bisacodyl (DULCOLAX) suppository 10 mg  10 mg Rectal Daily PRN Salary, Montell D, MD      . diphenhydrAMINE (BENADRYL) capsule 25 mg  25 mg Oral Daily PRN Salary, Montell D, MD      . docusate sodium (COLACE) capsule 100 mg  100  mg Oral BID Salary, Montell D, MD      . enoxaparin (LOVENOX) injection 40 mg  40 mg Subcutaneous Q24H Albertine Patricia, DPM      . [place]ill Memo ON 07/14/2017] febuxostat (ULORIC) tablet 40 mg  40 mg Oral Daily Salary, Montell D, MD      . Derrill Memo ON 07/14/2017] fesoterodine (TOVIAZ) tablet 4 mg  4 mg Oral Daily Salary, Montell D, MD      . folic acid (FOLVITE) tablet 1 mg  1 mg Oral Daily Salary, Montell D, MD      . HYDROcodone-acetaminophen (NORCO/VICODIN) 5-325 MG per tablet 1 tablet  1 tablet Oral Q6H PRN Troxler, Rodman Key, DPM      . hydroxychloroquine (PLAQUENIL) tablet 200 mg  200 mg Oral BID Salary, Montell D, MD      . lactated ringers infusion   Intravenous Continuous Troxler, Rodman Key, DPM      . [START ON 07/14/2017] leflunomide (ARAVA) tablet 20 mg  20 mg Oral BH-q7a Salary, Montell D, MD      . metoprolol succinate (TOPROL-XL) 24 hr tablet 50 mg  50 mg Oral QPM Salary, Montell D, MD      . montelukast (SINGULAIR) tablet 10 mg  10 mg Oral QHS Salary, Montell D, MD      . morphine 2 MG/ML injection 2 mg  2 mg Intravenous Q2H PRN Salary, Montell D, MD      . multivitamin with minerals tablet 1 tablet  1 tablet Oral Daily Salary, Montell D, MD      . ondansetron (ZOFRAN) tablet 4 mg  4 mg Oral Q6H PRN Salary, Montell D, MD       Or  . ondansetron (ZOFRAN) injection 4 mg  4 mg Intravenous Q6H PRN Salary, Montell D, MD      . oxyCODONE (Oxy IR/ROXICODONE) immediate release tablet 5 mg  5 mg Oral Q6H PRN Salary, Montell D, MD      . polyethylene glycol (MIRALAX / GLYCOLAX) packet 17 g  17 g Oral Daily PRN Salary, Montell D, MD      . potassium chloride (K-DUR,KLOR-CON) CR tablet 10 mEq  10 mEq Oral Daily Salary, Montell D, MD      . pregabalin (LYRICA) capsule 150 mg  150 mg Oral TID Salary, Montell D, MD      . sodium chloride (OCEAN) 0.65 % nasal spray 1 spray  1 spray Each Nare PRN Salary, Montell D, MD      . sodium chloride flush (NS) 0.9 % injection 3 mL  3 mL Intravenous Q12H Salary, Montell  D, MD      . sodium chloride flush (NS) 0.9 % injection 3 mL  3 mL Intravenous PRN Salary, Montell D, MD      . terazosin (HYTRIN) capsule 10 mg  10 mg Oral QHS Salary, Avel Peace, MD      .  torsemide (DEMADEX) tablet 20 mg  20 mg Oral BID PRN Salary, Avel Peace, MD         Discharge Medications: Please see discharge summary for a list of discharge medications.  Relevant Imaging Results:  Relevant Lab Results:   Additional Information SSN [id number]  Annamaria Boots, [place]

## 2017-07-13 NOTE — Progress Notes (Signed)
Chaplain responded to an OR for an AD. Pt had family Husband and daughter by the bedside. Chaplain offered education. Pt and family will review and discuss.    07/13/17 1400  Clinical Encounter Type  Visited With Patient and family together  Visit Type Initial  Referral From Physician  Spiritual Encounters  Spiritual Needs Brochure;Prayer

## 2017-07-13 NOTE — Care Management (Signed)
MD reached out to this [facility] concerning transition of care. He is concerned with patient's ability to maintain weight bearing status with her weight/weakness to other leg.  She will be non-weight bearing for some time.  PT is ordered. Patient has had a block so it may be tomorrow before PT can evaluate patient's ability. RNCM team to follow.

## 2017-07-13 NOTE — Op Note (Signed)
Operative note   Surgeon: Dr. Albertine Patricia, DPM.    Assistant: Dr. 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  Arthrodesis talar navicular joint right foot with staple fixation   2.  Arthrodesis navicular cuneiform joints intermediate and medial and inner cuneiform areas with staple fixation   3.  Arthrodesis second metatarsal phalangeal joint right foot with screw and plate fixation 4.  Arthrodesis third metatarsal cuneiform joint right foot with plate and screw fixation. Plate and screws were from [facility] set.  Other joints across the talonavicular and naviculocuneiform regions were fixated with compression staples    EBL: 30 cc    Anesthesia: Spinal anesthesia and popliteal block delivered by Dr. Marcello Moores.  During the course of the procedure I utilized 10 cc of 0.5% Marcaine with epinephrine.  At the end the case I injected Exparel mixed with Marcaine 50-50 mixture.  20 cc was delivered to crown the operative site.    Hemostasis: Mid calf tourniquet initially up for 90 minutes released for 15 minutes and then put back up for another 60 minutes.    Specimen: Degenerative bone and cartilage from the dorsal aspect of multiple joints on the right foot    Complications: Significant partial arthritic arthrodesis several joints.  No specific complications with surgery process.    Operative indications: Chronic arthritic pain dorsum of the right foot.  Patient had an injury and fractured her navicular.  This degenerated led to arthritis across the talonavicular joint and navicular cuneiform joints and ultimately the tarsometatarsal joints.  Severe and pronounced bone proliferation was noted medially and dorsally on the involved joints.  Patient was unable to walk and in spite of multiple  conservative treatments including manufacturing of specific ankle-foot orthoses she continued to have pain and irritation.    Procedure:  Patient was brought into the OR and placed on the operating table in thesupine position. After anesthesia was obtained theright lower extremity was prepped and draped in usual sterile fashion.  Operative Report: This time attention directed to the dorsum of the right foot where a 9 cm linear incision was made along the medial dorsal aspect of the talonavicular and navicular cuneiform joints.  The incision was deepened sharp dissection bleeders clamped and bovied as required.  Vital structures retracted medial laterally.  Periosteum capsule tissue was identified incised longitudinally along these joints.  This is free medial laterally away from the involved bones and joints.  Extensive bone proliferation was noted on the medial and dorsal aspects of these areas and complicated and increased difficulty in exposure of the joints.  Once these bone proliferated areas were resected we were able to open the talonavicular and navicular cuneiform joints.  Significant degenerative changes were noted at this timeframe.  All residual cartilage was removed from the areas including the talonavicular joint and the navicular cuneiform joints.  Once good bone was noted these regions ears were drilled and fenestrated and at this point with the rest of the areas.  The tourniquet was released at this point for approximately 10 to 15 minutes.  After reevaluating the area and reinflated the tourniquet incision was made lateral to the dorsalis pedis artery over the second metatarsal and third metatarsophalangeal joints linearly.  This was deepened sharp blunt dissection bleeders clamped and bovied as required vital  structures retracted medial laterally.  This point once again significant dorsal proliferation of bone was encountered.  This had to be resected and rasped smoothly in order to  reduce the prominence but also to find the damaged joints.  The third metatarsal cuneiform joint was identified the second metatarsal phalangeal joint was partially autofused and was not able to be freed up all the way.  The area was cleared up and any residual cartilage was removed.  Fibrotic bone and tissue was removed from the areas on both joints.  At this point these areas were fenestrated on both sides of the joint as well.  At this point residual good bone that is been resected from the fractured navicular laterally was harvested and combined with Trinity bone graft material.  This was packed into the second and third met cuneiform joint areas and to straight placed were placed across each joint with 2 screws proximal and 2 screws distally.  On the third met cuneiform joint we utilized a compression screw as well.  Has noted a large portion of the lateral navicular approximately the lateral third of it had fractured off and was separated this had to be removed in toto.  This is also another complicating factor to the surgery.  At this time tissue directed to the navicular cuneiform and the talar navicular joints.  More Trinity bone graft material was placed across the joints that earlier been fenestrated the joints were then positioned in appropriate position and 2 18 mm staples were placed across the naviculocuneiform joints.  These fixated the area well.  Checked FluoroScan good position correction were noted.  This time to navicular joint was identified packed with Trinity and 3 staples dorsal lateral dorsal medial and more medial were then placed across the talonavicular joint.  There is were checked FluoroScan at this point good position correction fixation were noted.  No lucencies were noted.  Patient will also be fitted with a bone stimulator at this point.  This point after copious irrigation capsular and periosteal tissue was closed on both areas with 3-0 Vicryl simple interrupted and  continuous stitch.  Deep superficial fascial layers were closed both incisions with 3-0 Vicryl in continuous stitch and skin was closed with 3-0 Vicryl in a subcuticular fashion. At this point the Exparel was injected into the operative sites and then a sterile compressive dressing was placed across wound consisting of Steri-Strips Xeroform gauze 4 x 4's, and forearm and Kerlix.  A posterior splint with cross stirrups were then placed across the area as well.    Patient tolerated the procedure and anesthesia well.  Was transported from the OR to the PACU with all vital signs stable and vascular status intact.

## 2017-07-13 NOTE — Discharge Instructions (Signed)
Peripheral Nerve Block (Lower Extremity) Discharge Instructions   1.  For your surgery you have received a femoral Nerve Block.  2. Your Nerve Block is expected to last for about 4 to 12 hours.  This is an estimated time frame; the results of your nerve block may wear off sooner or may last longer.  3. If needed, your surgeon will give you a prescription for pain medication.  It will take about 60 minutes for the oral pain medication to become fully effective.  So, it is recommended that you start taking this medication before the nerve block first begins to wear off, or when you first begin to feel discomfort.  4. Keep in mind that nerve blocks often wear off in the middle of the night.   If you are going to bed and the block has not started to wear off or you have not started to have any discomfort, consider setting an alarm for 2 to 3 hours, so you can assess your block.  If you notice the block is wearing off or you are starting to have discomfort, you can take your pain medication.  5. Take your pain medication only as prescribed.  Pain medication can cause sedation and decrease your breathing if you take more than you need for the level of pain that you have.  6. Nausea is a common side effect of many pain medications.  You may want to eat something before taking your pain medicine to prevent nausea.  7. After a Peripheral Nerve block, you cannot feel pain, pressure or extremes in temperature in the effected leg.  Because your leg is numb it is at an increased risk for injury.  To decrease the possibility of injury, please practice the following:  a. While you are awake change the position of your leg frequently to prevent too much pressure on any one area for prolonged periods of time.  b.  If you have a cast or tight dressing, check the color or your toes every couple of hours.  Call your surgeon with the appearance of any discoloration (white or blue).  c. You may have difficulty bearing  weight on the effected leg.  Have someone assist you with walking until the nerve block has completely worn off.   d. If you surgeon prescribed a brace to be worn after surgery, DO NOT GET UP AT NIGHT WITHOUT YOUR BRACE.  e. If your surgeon has restricted the amount of weight you should bear on the effected leg, i.e. No Weight, Partial Weight, or Touch Down Only, DO NOT BEAR MORE WEIGHT THAN INSTRUCTED.   If you experience any problems or concerns, please contact your surgeon.

## 2017-07-13 NOTE — Anesthesia Procedure Notes (Signed)
Spinal  Patient location during procedure: OR Staffing Anesthesiologist: Idalee Foxworthy, MD Performed: anesthesiologist  Preanesthetic Checklist Completed: patient identified, site marked, surgical consent, pre-op evaluation, timeout performed, IV checked and risks and benefits discussed Spinal Block Patient position: sitting Prep: ChloraPrep Patient monitoring: heart rate, cardiac monitor, continuous pulse ox and blood pressure Approach: midline Location: L3-4 Injection technique: single-shot Needle Needle type: Pencil-Tip  Needle gauge: 25 G Needle length: 9 cm Assessment Sensory level: T10     

## 2017-07-13 NOTE — Clinical Social Work Placement (Signed)
   CLINICAL SOCIAL WORK PLACEMENT  NOTE  Date:  07/13/2017  Patient Details  Name: EVELISSE SZALKOWSKI MRN: [id number] Date of Birth: 09/13/1950  Clinical Social Work is seeking post-discharge placement for this patient at the [place] level of care (*CSW will initial, date and re-position this form in  chart as items are completed):  Yes   Patient/family provided with [place] Work Department's list of facilities offering this level of care within the geographic area requested by the patient (or if unable, by the patient's family).  Yes   Patient/family informed of their freedom to choose among providers that offer the needed level of care, that participate in Medicare, Medicaid or managed care program needed by the patient, have an available bed and are willing to accept the patient.  Yes   Patient/family informed of [employer]'s ownership interest in [facility] and [facility], as well as of the fact that they are under no obligation to receive care at these facilities.  PASRR submitted to EDS on 07/13/17     PASRR number received on 07/13/17     Existing PASRR number confirmed on       FL2 transmitted to all facilities in geographic area requested by pt/family on       FL2 transmitted to all facilities within larger geographic area on       Patient informed that his/her managed care company has contracts with or will negotiate with certain facilities, including the following:            Patient/family informed of bed offers received.  Patient chooses bed at       Physician recommends and patient chooses bed at      Patient to be transferred to   on  .  Patient to be transferred to facility by       Patient family notified on   of transfer.  Name of family member notified:        PHYSICIAN       Additional Comment:    _______________________________________________ Annamaria Boots, [place] 07/13/2017, 2:22 PM

## 2017-07-13 NOTE — H&P (Signed)
[facility] [place] NAME: Tina Pacheco    MR#:  [id number]  DATE OF BIRTH:  01/06/1951  DATE OF ADMISSION:  07/13/2017  PRIMARY CARE PHYSICIAN: Venia Carbon, MD   REQUESTING/REFERRING PHYSICIAN:   CHIEF COMPLAINT: Right foot pain  HISTORY OF PRESENT ILLNESS: Tina Pacheco  is a 67 y.o. female with a known history per below status post elective right foot surgery with 6 fusions by podiatry/Dr. Elvina Mattes, hospitalist service was asked to admit from the PACU postoperatively after elective procedure for pain control and need for probable skilled nursing facility placement given intractable pain, patient evaluated in the PACU, currently without complaint, no issues per nursing staff, patient is now being admitted for acute intractable right foot pain secondary to osteoarthritis/rheumatoid arthritis.  PAST MEDICAL HISTORY:   Past Medical History:  Diagnosis Date  . Arthritis    RA  . Asthma   . Basal cell carcinoma   . Cataract 2018   bilateral eyes; corrected with surgery  . Claustrophobia   . COPD (chronic obstructive pulmonary disease) ([place])   . Diastolic dysfunction    a. echo 07/2014: EF 55-60%, no RWMA, GR2DD, mild MR, LA moderately dilated, PASP 38 mm Hg  . Dyslipidemia   . Dysrhythmia   . Headache    migraines  . Hemihypertrophy   . History of cardiac cath    a. 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 History of gout   . Hyperplastic colonic polyp 2003  . Hypertension   . Hypokalemia   . Morbid obesity ([place])   . PAF (paroxysmal atrial fibrillation) ([facility])    a. on Pradaxa; b. CHADSVASc at least 2 (HTN & female)  . Rheumatoid arthritis(714.0)   . Sleep apnea    a. not compliant with CPAP    PAST SURGICAL HISTORY:  Past Surgical History:  Procedure Laterality Date  . ABDOMINAL HYSTERECTOMY    . CARDIAC CATHETERIZATION  05/24/2010   nonobstructive CAD  . CATARACT EXTRACTION W/ INTRAOCULAR LENS IMPLANT Right  06/12/2016   Dr. Darleen Crocker  . CATARACT EXTRACTION W/ INTRAOCULAR LENS IMPLANT Left 06/26/2016   Dr. Darleen Crocker  . CESAREAN SECTION    . CHOLECYSTECTOMY    . COLONOSCOPY  06/12/2011   Procedure: COLONOSCOPY;  Surgeon: Juanita Craver, MD;  Location: [facility];  Service: Endoscopy;  Laterality: N/A;  . COLONOSCOPY N/A 03/17/2013   Procedure: COLONOSCOPY;  Surgeon: Juanita Craver, MD;  Location: [facility];  Service: Endoscopy;  Laterality: N/A;  . EYE SURGERY    . KNEE ARTHROSCOPY     bilateral  . SINUSOTOMY    . TOOTH EXTRACTION  12/2016  . VAGINAL HYSTERECTOMY      SOCIAL HISTORY:  Social History   Tobacco Use  . Smoking status: Never Smoker  . Smokeless tobacco: Never Used  Substance Use Topics  . Alcohol use: No    FAMILY HISTORY:  Family History  Problem Relation Age of Onset  . Emphysema Father        smoked  . Tuberculosis Mother   . Parkinsonism Mother   . Diabetes type II Sister   . Breast cancer Sister   . Breast cancer Maternal Aunt   . Tuberculosis Sister     DRUG ALLERGIES: No Known Allergies  REVIEW OF SYSTEMS:   CONSTITUTIONAL: No fever, fatigue or weakness.  EYES: No blurred or double vision.  EARS, NOSE, AND THROAT: No tinnitus or ear pain.  RESPIRATORY: No cough,  shortness of breath, wheezing or hemoptysis.  CARDIOVASCULAR: No chest pain, orthopnea, edema.  GASTROINTESTINAL: No nausea, vomiting, diarrhea or abdominal pain.  GENITOURINARY: No dysuria, hematuria.  ENDOCRINE: No polyuria, nocturia,  HEMATOLOGY: No anemia, easy bruising or bleeding SKIN: No rash or lesion. MUSCULOSKELETAL: Acute on chronic right foot pain NEUROLOGIC: No tingling, numbness, weakness.  PSYCHIATRY: No anxiety or depression.   MEDICATIONS AT HOME:  Prior to Admission medications   Medication Sig Start Date End Date Taking? Authorizing Provider  amLODipine (NORVASC) 5 MG tablet TAKE 1 TABLET BY MOUTH ONCE DAILY Patient taking differently: Take 5 mgs by mouth  once daily in the evening 05/28/17  Yes Venia Carbon, MD  atorvastatin (LIPITOR) 20 MG tablet Take 1 tablet (20 mg total) by mouth daily at 6 PM. Patient taking differently: Take 20 mg by mouth every evening.  05/16/17  Yes Venia Carbon, MD  diphenhydrAMINE (BENADRYL) 25 MG tablet Take 25 mg by mouth daily as needed for allergies.   Yes [provider]  febuxostat (ULORIC) 40 MG tablet Take 40 mg by mouth daily.   Yes [provider]  folic acid (FOLVITE) 1 MG tablet Take 1 mg by mouth daily.   Yes [provider]  hydroxychloroquine (PLAQUENIL) 200 MG tablet Take 200 mg by mouth 2 (two) times daily.    Yes [provider]  KLOR-CON M20 20 MEQ tablet TAKE 1/2 TABLET (=10MEQ)   TWO TIMES A DAY ; MAY TAKE AND EXTRA TABLET WHEN      TAKING TORSEMIDE 01/29/17  Yes Gollan, Kathlene November, MD  leflunomide (ARAVA) 20 MG tablet Take 20 mg by mouth every morning.  09/20/11  Yes [provider]  metoprolol succinate (TOPROL-XL) 50 MG 24 hr tablet Take 1 tablet (50 mg total) by mouth daily. Take with or immediately following a meal. Patient taking differently: Take 50 mg by mouth every evening. Take with or immediately following a meal. 11/16/16  Yes Lucille Passy, MD  montelukast (SINGULAIR) 10 MG tablet Take 1 tablet (10 mg total) by mouth at bedtime. 11/16/16  Yes Lucille Passy, MD  Multiple Vitamin (MULTIVITAMIN) tablet Take 1 tablet by mouth daily.    Yes [provider]  oxyCODONE (ROXICODONE) 5 MG immediate release tablet Take 2 tablets by mouth every 12 hours 07/02/17  Yes Tower, Wynelle Fanny, MD  PRADAXA 150 MG CAPS capsule TAKE 1 CAPSULE TWICE DAILY 05/28/17  Yes Gollan, Kathlene November, MD  pregabalin (LYRICA) 150 MG capsule Take 150 mg by mouth 3 (three) times daily.   Yes [provider]  terazosin (HYTRIN) 10 MG capsule TAKE 1 CAPSULE BY MOUTH AT BEDTIME 05/16/17  Yes Viviana Simpler I, MD  tolterodine (DETROL LA) 2 MG 24 hr capsule Take 1 capsule (2  mg total) by mouth daily. Patient taking differently: Take 2 mg by mouth daily as needed (bladder spasms).  02/02/16  Yes Lucille Passy, MD  torsemide (DEMADEX) 20 MG tablet take 1 tablet by mouth twice a day for edema Patient taking differently: Take 20 mg by mouth twice daily as needed for swelling 07/28/16  Yes Lucille Passy, MD  ibuprofen (ADVIL,MOTRIN) 200 MG tablet Take 400-600 mg by mouth 2 (two) times daily as needed.    [provider]  SF 5000 PLUS 1.1 % CREA dental cream Place 1 application onto teeth 2 (two) times daily. 06/01/17   [provider]  sodium chloride (OCEAN) 0.65 % SOLN nasal spray Place 1 spray  into both nostrils as needed for congestion.    [provider]      PHYSICAL EXAMINATION:   VITAL SIGNS: Blood pressure (!) 128/54, pulse (!) 55, temperature 98 F (36.7 C), resp. rate 18, weight 131.5 kg (290 lb), SpO2 97 %.  GENERAL:  67 y.o.-year-old patient lying in the bed with no acute distress.  Extreme morbid obesity EYES: Pupils equal, round, reactive to light and accommodation. No scleral icterus. Extraocular muscles intact.  HEENT: Head atraumatic, normocephalic. Oropharynx and nasopharynx clear.  NECK:  Supple, no jugular venous distention. No thyroid enlargement, no tenderness.  LUNGS: Normal breath sounds bilaterally, no wheezing, rales,rhonchi or crepitation. No use of accessory muscles of respiration.  CARDIOVASCULAR: S1, S2 normal. No murmurs, rubs, or gallops.  ABDOMEN: Soft, nontender, nondistended. Bowel sounds present. No organomegaly or mass.  EXTREMITIES: No pedal edema, cyanosis, or clubbing.  Right lower extremity with postoperative dressing intact/clean/dry NEUROLOGIC: Cranial nerves II through XII are intact. Muscle strength 5/5 in all extremities. Sensation intact. Gait not checked.  PSYCHIATRIC: The patient is alert and oriented x 3.  SKIN: No obvious rash, lesion, or ulcer.   LABORATORY PANEL:   CBC Recent Labs  Lab  07/09/17 0810  WBC 4.8  HGB 12.9  HCT 38.8  PLT 155  MCV 87.2  MCH 28.9  MCHC 33.2  RDW 16.0*   ------------------------------------------------------------------------------------------------------------------  Chemistries  Recent Labs  Lab 07/09/17 0810  NA 138  K 3.6  CL 101  CO2 29  GLUCOSE 86  BUN 27*  CREATININE 1.06*  CALCIUM 9.9   ------------------------------------------------------------------------------------------------------------------ estimated creatinine clearance is 70.6 mL/min (A) (by C-G formula based on SCr of 1.06 mg/dL (H)). ------------------------------------------------------------------------------------------------------------------ No results for input(s): TSH, T4TOTAL, T3FREE, THYROIDAB in the last 72 hours.  Invalid input(s): FREET3   Coagulation profile No results for input(s): INR, PROTIME in the last 168 hours. ------------------------------------------------------------------------------------------------------------------- No results for input(s): DDIMER in the last 72 hours. -------------------------------------------------------------------------------------------------------------------  Cardiac Enzymes No results for input(s): CKMB, TROPONINI, MYOGLOBIN in the last 168 hours.  Invalid input(s): CK ------------------------------------------------------------------------------------------------------------------ Invalid input(s): POCBNP  ---------------------------------------------------------------------------------------------------------------  Urinalysis    Component Value Date/Time   COLORURINE YELLOW (A) 08/09/2014 1951   APPEARANCEUR CLEAR (A) 08/09/2014 1951   APPEARANCEUR Cloudy 03/28/2014 1033   LABSPEC 1.012 08/09/2014 1951   LABSPEC 1.018 03/28/2014 1033   PHURINE 6.0 08/09/2014 1951   GLUCOSEU NEGATIVE 08/09/2014 1951   GLUCOSEU Negative 03/28/2014 1033   HGBUR 1+ (A) 08/09/2014 1951   BILIRUBINUR  NEGATIVE 08/09/2014 1951   BILIRUBINUR Negative 03/28/2014 1033   [place] 08/09/2014 1951   PROTEINUR 30 (A) 08/09/2014 1951   NITRITE NEGATIVE 08/09/2014 1951   LEUKOCYTESUR NEGATIVE 08/09/2014 1951   LEUKOCYTESUR 3+ 03/28/2014 1033     RADIOLOGY: No results found.  EKG: Orders placed or performed in visit on 07/06/17  . EKG 12-Lead    IMPRESSION AND PLAN: 1 acute intractable right foot pain secondary to osteoarthritis/rheumatoid arthritis Status post right foot surgery with 6 fusions by podiatry Consult podiatry for continuity of care, adult pain protocol  2 history of chronic A. Fib Stable Continue Toprol-XL, Pradaxa currently on hold for operative intervention-in discussion with podiatry/will resume on tomorrow  3 chronic benign essential hypertension Stable Continue home regiment  4 chronic extreme morbid obesity Most likely secondary to excess calories Lifestyle modification recommended  5 chronic asthma without exacerbation Stable Breathing treatments as needed   Disposition to skilled nursing facility in 2 to 3 days barring any complications  All the records are reviewed and case discussed with ED provider. Management plans discussed with the patient, family and they are in agreement.  CODE STATUS:full    Code Status Orders  (From admission, onward)        Start     Ordered   07/13/17 1316  Full code  Continuous     07/13/17 1315    Code Status History    Date Active Date Inactive Code Status Order ID Comments User Context   08/10/2014 0200 08/11/2014 1605 Full Code [id number]  Lance Coon, MD Inpatient       TOTAL TIME TAKING CARE OF THIS PATIENT: 45 minutes.    Avel Peace Marigny Borre M.D on 07/13/2017   Between 7am to 6pm - Pager - [phone]  After 6pm go to [email or web address] - password [email or web address] [facility]  Office  [phone]  CC: Primary care physician; Venia Carbon, MD   Note: This dictation was  prepared with Dragon dictation along with smaller phrase technology. Any transcriptional errors that result from this process are unintentional.

## 2017-07-13 NOTE — Progress Notes (Signed)
Pt c/o being restless and unable to sleep, right foot kept elevated on multiple pillows. Dr. Jannifer Franklin paged and ordered for one time dose of Ambien 5mg  PO. Will administer and continue to monitor.

## 2017-07-13 NOTE — H&P (Signed)
H and P has been reviewed and no changes are noted.  

## 2017-07-13 NOTE — Transfer of Care (Signed)
Immediate Anesthesia Transfer of Care Note  Patient: Tina Pacheco  Procedure(s) Performed: ARTHRODESIS FOOT-MULTI.FUSIONS (6 JOINTS) (Right )  Patient Location: PACU  Anesthesia Type:Spinal  Level of Consciousness: awake, alert  and oriented  Airway & Oxygen Therapy: Patient Spontanous Breathing and Patient connected to face mask oxygen  Post-op Assessment: Report given to RN and Post -op Vital signs reviewed and stable  Post vital signs: Reviewed and stable  Last Vitals:  Vitals Value Taken Time  BP 114/57 07/13/2017 11:59 AM  Temp    Pulse 59 07/13/2017 11:59 AM  Resp 16 07/13/2017 11:59 AM  SpO2 100 % 07/13/2017 11:59 AM  Vitals shown include unvalidated device data.  Last Pain:  Vitals:   07/13/17 0610  TempSrc: Temporal         Complications: No apparent anesthesia complications

## 2017-07-13 NOTE — Anesthesia Post-op Follow-up Note (Signed)
Anesthesia QCDR form completed.        

## 2017-07-13 NOTE — Anesthesia Postprocedure Evaluation (Signed)
Anesthesia Post Note  Patient: Tina Pacheco  Procedure(s) Performed: ARTHRODESIS FOOT-MULTI.FUSIONS (6 JOINTS) (Right )  Patient location during evaluation: PACU Anesthesia Type: Spinal Level of consciousness: awake and alert Pain management: pain level controlled Vital Signs Assessment: post-procedure vital signs reviewed and stable Respiratory status: spontaneous breathing, nonlabored ventilation, respiratory function stable and patient connected to nasal cannula oxygen Cardiovascular status: blood pressure returned to baseline and stable Postop Assessment: no apparent nausea or vomiting Anesthetic complications: no     Last Vitals:  Vitals:   07/13/17 1255 07/13/17 1333  BP: (!) 128/54 (!) 136/52  Pulse: (!) 55 (!) 56  Resp: 18 18  Temp: 36.9 C   SpO2: 97% 100%    Last Pain:  Vitals:   07/13/17 1255  TempSrc:   PainSc: [place]

## 2017-07-13 NOTE — Clinical Social Work Note (Signed)
Clinical Social Work Assessment  Patient Details  Name: Tina Pacheco MRN: [id number] Date of Birth: [birth year]  Date of referral:  07/13/17               Reason for consult:  Facility Placement                Permission sought to share information with:  Case Manager, Customer service manager, Family Supports Permission granted to share information::  Yes, Verbal Permission Granted  Name::      SNF  Agency::   [facility]   Relationship::     Contact Information:     Housing/Transportation Living arrangements for the past 2 months:  Export of Information:  Patient Patient Interpreter Needed:    Criminal Activity/Legal Involvement Pertinent to Current Situation/Hospitalization:    Significant Relationships:  Spouse, Adult Children Lives with:  Spouse Do you feel safe going back to the place where you live?  Yes Need for family participation in patient care:  Yes (Comment)  Care giving concerns:  Patient lives with husband in [place]    Social Worker assessment / plan:  CSW consulted due to possible SNF placement. CSW met with patient, husband Tina Pacheco [phone] and daughter Tina Pacheco [phone]. Patient states that she is usually independent at baseline but will be non weight bearing for 8-10 weeks per MD. CSW explained role and explained the bed search process. Patient is interested in going to SNF because she does not have anyone at home that can help her since she is non weight bearing. CSW explained that patient is currently inpatient but would have to maintain inpatient status for 3 nights in order to qualify for Medicare to pay for SNF. CSW also explained that many facilities do not have bariatric equipment. Patient states that she understands that but would still like to pursue SNF placement. Patient would prefer [facility] or [facility] due to proximity to her home. CSW initiated bed search and will give patient bed  offers once received.   Employment status:  Unemployed Forensic scientist:  Medicare PT Recommendations:  [place] / Referral to community resources:  [employer]  Patient/Family's Response to care:  Patient is understanding of her current condition.   Patient/Family's Understanding of and Emotional Response to Diagnosis, Current Treatment, and Prognosis:  Patient's husband and daughter are supportive but unable to provide medical care for her in the home.   Emotional Assessment Appearance:  Appears stated age Attitude/Demeanor/Rapport:    Affect (typically observed):  Accepting, Pleasant Orientation:  Oriented to Self, Oriented to Place, Oriented to  Time, Oriented to Situation Alcohol / Substance use:  Not Applicable Psych involvement (Current and /or in the community):  No (Comment)  Discharge Needs  Concerns to be addressed:  Discharge Planning Concerns Readmission within the last 30 days:  No Current discharge risk:  Dependent with Mobility Barriers to Discharge:  Continued Medical Work up   [employer], [place] 07/13/2017, 2:14 PM

## 2017-07-14 MED ORDER — MORPHINE SULFATE (PF) 4 MG/ML IV SOLN
4.0000 mg | INTRAVENOUS | Status: DC | PRN
  Administered 2017-07-14 – 2017-07-15 (×7): 4 mg via INTRAVENOUS
  Filled 2017-07-14 (×7): qty 1

## 2017-07-14 MED ORDER — ZOLPIDEM TARTRATE 5 MG PO TABS
5.0000 mg | ORAL_TABLET | Freq: Every evening | ORAL | Status: DC | PRN
  Administered 2017-07-15: 5 mg via ORAL
  Filled 2017-07-14: qty 1

## 2017-07-14 NOTE — Progress Notes (Signed)
[facility]                                                      Patient Demographics  Tina Pacheco, is a 67 y.o. female   MRN: [id number]   DOB - 04/17/1950  Admit Date - 07/13/2017    Outpatient Primary MD for the patient is Venia Carbon, MD  Consult requested in the Hospital by Henreitta Leber, MD, On 07/14/2017     With History of -  Past Medical History:  Diagnosis Date  . Arthritis    RA  . Asthma   . Basal cell carcinoma   . Cataract 2018   bilateral eyes; corrected with surgery  . Claustrophobia   . COPD (chronic obstructive pulmonary disease) ([place])   . Diastolic dysfunction    a. echo 07/2014: EF 55-60%, no RWMA, GR2DD, mild MR, LA moderately dilated, PASP 38 mm Hg  . Dyslipidemia   . Dysrhythmia   . Headache    migraines  . Hemihypertrophy   . History of cardiac cath    a. 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 History of gout   . Hyperplastic colonic polyp 2003  . Hypertension   . Hypokalemia   . Morbid obesity ([place])   . PAF (paroxysmal atrial fibrillation) (HCC)    a. on Pradaxa; b. CHADSVASc at least 2 (HTN & female)  . Rheumatoid arthritis(714.0)   . Sleep apnea    a. not compliant with CPAP      Past Surgical History:  Procedure Laterality Date  . ABDOMINAL HYSTERECTOMY    . CARDIAC CATHETERIZATION  05/24/2010   nonobstructive CAD  . CATARACT EXTRACTION W/ INTRAOCULAR LENS IMPLANT Right 06/12/2016   Dr. Darleen Crocker  . CATARACT EXTRACTION W/ INTRAOCULAR LENS IMPLANT Left 06/26/2016   Dr. Darleen Crocker  . CESAREAN SECTION    . CHOLECYSTECTOMY    . COLONOSCOPY  06/12/2011   Procedure: COLONOSCOPY;  Surgeon: Juanita Craver, MD;  Location: [facility];  Service: Endoscopy;  Laterality: N/A;  . COLONOSCOPY N/A 03/17/2013   Procedure: COLONOSCOPY;  Surgeon: Juanita Craver, MD;  Location: [facility];   Service: Endoscopy;  Laterality: N/A;  . EYE SURGERY    . KNEE ARTHROSCOPY     bilateral  . SINUSOTOMY    . TOOTH EXTRACTION  12/2016  . VAGINAL HYSTERECTOMY      in for   No chief complaint on file.    HPI  Tina Pacheco  is a 67 y.o. female, 1 day postop following multi-joint fusion to the right foot.    Social History Social History   Tobacco Use  . Smoking status: Never Smoker  . Smokeless tobacco: Never Used  Substance Use Topics  . Alcohol use: No    Family History Family History  Problem Relation Age of Onset  . Emphysema Father        smoked  . Tuberculosis Mother   . Parkinsonism Mother   . Diabetes type II Sister   . Breast cancer Sister   . Breast cancer Maternal Aunt   . Tuberculosis Sister     Prior to Admission medications   Medication Sig Start Date End Date Taking? Authorizing Provider  amLODipine (NORVASC) 5 MG tablet TAKE 1 TABLET BY MOUTH ONCE DAILY Patient taking differently: Take 5 mgs  by mouth once daily in the evening 05/28/17  Yes Venia Carbon, MD  atorvastatin (LIPITOR) 20 MG tablet Take 1 tablet (20 mg total) by mouth daily at 6 PM. Patient taking differently: Take 20 mg by mouth every evening.  05/16/17  Yes Venia Carbon, MD  diphenhydrAMINE (BENADRYL) 25 MG tablet Take 25 mg by mouth daily as needed for allergies.   Yes [provider]  febuxostat (ULORIC) 40 MG tablet Take 40 mg by mouth daily.   Yes [provider]  folic acid (FOLVITE) 1 MG tablet Take 1 mg by mouth daily.   Yes [provider]  hydroxychloroquine (PLAQUENIL) 200 MG tablet Take 200 mg by mouth 2 (two) times daily.    Yes [provider]  KLOR-CON M20 20 MEQ tablet TAKE 1/2 TABLET (=10MEQ)   TWO TIMES A DAY ; MAY TAKE AND EXTRA TABLET WHEN      TAKING TORSEMIDE 01/29/17  Yes Gollan, Kathlene November, MD  leflunomide (ARAVA) 20 MG tablet Take 20 mg by mouth every morning.  09/20/11  Yes [provider]  metoprolol succinate  (TOPROL-XL) 50 MG 24 hr tablet Take 1 tablet (50 mg total) by mouth daily. Take with or immediately following a meal. Patient taking differently: Take 50 mg by mouth every evening. Take with or immediately following a meal. 11/16/16  Yes Lucille Passy, MD  montelukast (SINGULAIR) 10 MG tablet Take 1 tablet (10 mg total) by mouth at bedtime. 11/16/16  Yes Lucille Passy, MD  Multiple Vitamin (MULTIVITAMIN) tablet Take 1 tablet by mouth daily.    Yes [provider]  oxyCODONE (ROXICODONE) 5 MG immediate release tablet Take 2 tablets by mouth every 12 hours 07/02/17  Yes Tower, Wynelle Fanny, MD  PRADAXA 150 MG CAPS capsule TAKE 1 CAPSULE TWICE DAILY 05/28/17  Yes Gollan, Kathlene November, MD  pregabalin (LYRICA) 150 MG capsule Take 150 mg by mouth 3 (three) times daily.   Yes [provider]  terazosin (HYTRIN) 10 MG capsule TAKE 1 CAPSULE BY MOUTH AT BEDTIME 05/16/17  Yes Viviana Simpler I, MD  tolterodine (DETROL LA) 2 MG 24 hr capsule Take 1 capsule (2 mg total) by mouth daily. Patient taking differently: Take 2 mg by mouth daily as needed (bladder spasms).  02/02/16  Yes Lucille Passy, MD  torsemide (DEMADEX) 20 MG tablet take 1 tablet by mouth twice a day for edema Patient taking differently: Take 20 mg by mouth twice daily as needed for swelling 07/28/16  Yes Lucille Passy, MD  ibuprofen (ADVIL,MOTRIN) 200 MG tablet Take 400-600 mg by mouth 2 (two) times daily as needed.    [provider]  SF 5000 PLUS 1.1 % CREA dental cream Place 1 application onto teeth 2 (two) times daily. 06/01/17   [provider]  sodium chloride (OCEAN) 0.65 % SOLN nasal spray Place 1 spray into both nostrils as needed for congestion.    [provider]    Anti-infectives (From admission, onward)   Start     Dose/Rate Route Frequency Ordered Stop   07/13/17 1400  hydroxychloroquine (PLAQUENIL) tablet 200 mg  Status:  Discontinued     200 mg Oral 2 times daily 07/13/17 1315 07/13/17 1617    07/13/17 0615  ceFAZolin (ANCEF) IVPB 2g/100 mL premix     2 g 200 mL/hr over 30 Minutes Intravenous On call to O.R. 07/13/17 0604 07/13/17 0808   07/13/17 0610  ceFAZolin (ANCEF) 2-4 GM/100ML-% IVPB  Note to Pharmacy:  Dewayne Hatch   : cabinet override      07/13/17 0610 07/13/17 0758      Scheduled Meds: . amLODipine  5 mg Oral Daily  . atorvastatin  20 mg Oral QPM  . docusate sodium  100 mg Oral BID  . enoxaparin (LOVENOX) injection  40 mg Subcutaneous Q24H  . febuxostat  40 mg Oral Daily  . fesoterodine  4 mg Oral Daily  . folic acid  1 mg Oral Daily  . leflunomide  20 mg Oral [id number]  . metoprolol succinate  50 mg Oral QPM  . montelukast  10 mg Oral QHS  . multivitamin with minerals  1 tablet Oral Daily  . potassium chloride SA  10 mEq Oral Daily  . pregabalin  150 mg Oral TID  . sodium chloride flush  3 mL Intravenous Q12H  . terazosin  10 mg Oral QHS   Continuous Infusions: . sodium chloride    . lactated ringers 75 mL/hr (07/13/17 2335)   PRN Meds:.sodium chloride, acetaminophen **OR** acetaminophen, bisacodyl, diphenhydrAMINE, morphine injection, ondansetron **OR** ondansetron (ZOFRAN) IV, oxyCODONE, polyethylene glycol, sodium chloride, sodium chloride flush, torsemide  No Known Allergies  Physical Exam: Patient's alert this morning states she still having quite a bit of pain.  Currently is using both morphine as well as oxycodone.  She states she gets some relief from that but does not last long.  Vitals  Blood pressure (!) 144/57, pulse 85, temperature 99.7 F (37.6 C), temperature source Oral, resp. rate 20, height 5\' 5"  (1.651 m), weight 132.5 kg (292 lb 1.8 oz), SpO2 96 %.  Lower Extremity exam: Exam of the foot today shows the dressing is intact.  There is some bleeding occurring on the bandage but it does not seem to excessive.  She is an quite a bit of pain.  She is uncomfortable when I move the foot.  The bone stimulator has rotated is not an  appropriate place at this point.  Data Review  CBC Recent Labs  Lab 07/09/17 0810  WBC 4.8  HGB 12.9  HCT 38.8  PLT 155  MCV 87.2  MCH 28.9  MCHC 33.2  RDW 16.0*   ------------------------------------------------------------------------------------------------------------------  Chemistries  Recent Labs  Lab 07/09/17 0810  NA 138  K 3.6  CL 101  CO2 29  GLUCOSE 86  BUN 27*  CREATININE 1.06*  CALCIUM 9.9   ------------------------------------------------------------------------------------------------------------------ estimated creatinine clearance is 70.9 mL/min (A) (by C-G formula based on SCr of 1.06 mg/dL (H)). ------------------------------------------------------------------------------------------------------------------ No results for input(s): TSH, T4TOTAL, T3FREE, THYROIDAB in the last 72 hours.  Invalid input(s): FREET3 Urinalysis    Component Value Date/Time   COLORURINE YELLOW (A) 08/09/2014 1951   APPEARANCEUR CLEAR (A) 08/09/2014 1951   APPEARANCEUR Cloudy 03/28/2014 1033   LABSPEC 1.012 08/09/2014 1951   LABSPEC 1.018 03/28/2014 1033   PHURINE 6.0 08/09/2014 1951   GLUCOSEU NEGATIVE 08/09/2014 1951   GLUCOSEU Negative 03/28/2014 1033   HGBUR 1+ (A) 08/09/2014 1951   BILIRUBINUR NEGATIVE 08/09/2014 1951   BILIRUBINUR Negative 03/28/2014 1033   [place] 08/09/2014 1951   PROTEINUR 30 (A) 08/09/2014 1951   NITRITE NEGATIVE 08/09/2014 1951   LEUKOCYTESUR NEGATIVE 08/09/2014 1951   LEUKOCYTESUR 3+ 03/28/2014 1033     Imaging results:   No results found.  Assessment & Plan: Patient is 1 day postop following fusion of the talar navicular joint the navicular cuneiform joints and second third tarsometatarsal joints.  She did have extensive dissection  due to the excessive bone proliferation across these areas as well.  Also a portion of the fracture navicular had to be removed.  Plan: We are going to bump up her morphine from 2 mg to 4  mg to get 34 hours.  She is on oxycodone 10 mg every 4 hours also.  See if that helps to keep a bit more comfortable from overall standpoint.  Reassess tomorrow with her pain levels.  Hopefully she will get some improvement after today as far as reducing pain.  Also split the splint on top of her foot in order to take some pressure off the area to see if that does not help her some as well.  She still needs remain nonweightbearing I would not recommend physical therapy today due to her pain level.  She also wants to continue to keep the Foley catheter in because she is not comfortable using the bedpan and does not think she can do it effectively.  Letter will be able to remove this tomorrow also.  Active Problems:   Acute pain in joint   Family Communication: Plan discussed with patient   Albertine Patricia M.D on 07/14/2017 at 6:31 AM  Thank you for the consult, we will follow the patient with you in the Hospital.

## 2017-07-14 NOTE — Progress Notes (Signed)
PT Cancellation Note  Patient Details Name: Tina Pacheco MRN: [id number] DOB: 03-11-1950   Cancelled Treatment:    Reason Eval/Treat Not Completed: Other (comment). Order received and chart reviewed. Pt is s/p Sx on R ankle fusion in 6 joints. Per MD, not appropriate for PT intervention this date due to poor pain control. Will re-attempt next date.   Kruz Chiu 07/14/2017, 9:29 AM Greggory Stallion, PT, DPT [phone]

## 2017-07-14 NOTE — Progress Notes (Signed)
[place] at [place] NAME: Tina Pacheco    MR#:  [id number]  DATE OF BIRTH:  [birth year]  SUBJECTIVE:   Patient here due to severe osteoarthritis of the right foot and status post multiple right foot plates and screw placement.  She was admitted due to significant pain control to the right foot.  Pain still seems uncontrolled and patient getting some IV morphine.  REVIEW OF SYSTEMS:    Review of Systems  Constitutional: Negative for chills and fever.  HENT: Negative for congestion and tinnitus.   Eyes: Negative for blurred vision and double vision.  Respiratory: Negative for cough, shortness of breath and wheezing.   Cardiovascular: Negative for chest pain, orthopnea and PND.  Gastrointestinal: Negative for abdominal pain, diarrhea, nausea and vomiting.  Genitourinary: Negative for dysuria and hematuria.  Musculoskeletal: Positive for joint pain (Right Foot).  Neurological: Negative for dizziness, sensory change and focal weakness.  All other systems reviewed and are negative.   Nutrition: Heart Healthy Tolerating Diet: Yes Tolerating PT: Await Eval.     DRUG ALLERGIES:  No Known Allergies  VITALS:  Blood pressure (!) 130/49, pulse 79, temperature 99.3 F (37.4 C), temperature source Oral, resp. rate 18, height 5\' 5"  (1.651 m), weight 132.5 kg (292 lb 1.8 oz), SpO2 97 %.  PHYSICAL EXAMINATION:   Physical Exam  GENERAL:  67 y.o.-year-old patient lying in bed in no acute distress.  EYES: Pupils equal, round, reactive to light and accommodation. No scleral icterus. Extraocular muscles intact.  HEENT: Head atraumatic, normocephalic. Oropharynx and nasopharynx clear.  NECK:  Supple, no jugular venous distention. No thyroid enlargement, no tenderness.  LUNGS: Normal breath sounds bilaterally, no wheezing, rales, rhonchi. No use of accessory muscles of respiration.  CARDIOVASCULAR: S1, S2 normal. No murmurs, rubs, or gallops.  ABDOMEN:  Soft, nontender, nondistended. Bowel sounds present. No organomegaly or mass.  EXTREMITIES: No cyanosis, clubbing or edema b/l.  Right foot dressing in place from surgery with no acute drainage.   NEUROLOGIC: Cranial nerves II through XII are intact. No focal Motor or sensory deficits b/l.   PSYCHIATRIC: The patient is alert and oriented x 3.  SKIN: No obvious rash, lesion, or ulcer.    LABORATORY PANEL:   CBC Recent Labs  Lab 07/09/17 0810  WBC 4.8  HGB 12.9  HCT 38.8  PLT 155   ------------------------------------------------------------------------------------------------------------------  Chemistries  Recent Labs  Lab 07/09/17 0810  NA 138  K 3.6  CL 101  CO2 29  GLUCOSE 86  BUN 27*  CREATININE 1.06*  CALCIUM 9.9   ------------------------------------------------------------------------------------------------------------------  Cardiac Enzymes No results for input(s): TROPONINI in the last 168 hours. ------------------------------------------------------------------------------------------------------------------  RADIOLOGY:  No results found.   ASSESSMENT AND PLAN:   67 year old female with past medical history of essential hypertension, osteoarthritis/rheumatoid arthritis, paroxysmal atrial fibrillation, hyperlipidemia, COPD, history of chronic diastolic CHF who presented to the hospital after right foot surgery for debilitating OA/rheumatoid arthritis of the right foot status post multiple screws and plate placements.  1.  Right foot pain-patient is status post multiple screws and plate placement to the right foot secondary to debilitating osteoarthritis/rheumatoid arthritis.   -patient's pain is still not well controlled.  Continue IV morphine, oxycodone has been increased as per podiatry. -Further care as per podiatry.  2.  Essential hypertension- continue Norvasc, Toprol.  3.  Hyperlipidemia-continue atorvastatin.  4. Hx of RA - cont. Arava, Folic  Acid, Uloric.   5.  Neuropathy-continue Lyrica.  6.  Insomnia - cont. PRN ambien.   Await PT eval.  Possible discharge to SNF.   All the records are reviewed and case discussed with Care Management/Social Worker. Management plans discussed with the patient, family and they are in agreement.  CODE STATUS: Full code  DVT Prophylaxis: Lovenox  TOTAL TIME TAKING CARE OF THIS PATIENT: 30 minutes.   POSSIBLE D/C IN 1-2 DAYS, DEPENDING ON CLINICAL CONDITION.   Henreitta Leber M.D on 07/14/2017 at 1:00 PM  Between 7am to 6pm - Pager - [phone]  After 6pm go to [email or web address] - Proofreader  [facility]  Office  [phone]  CC: Primary care physician; Venia Carbon, MD

## 2017-07-14 NOTE — Progress Notes (Signed)
Pt is post op day 1 with foley catheter due to be taken out this time however pt refused. Pt stated that she "is not comfortable with the idea of using the bedpan" and she is "still in a lot of pain when moving" despite pain management regimen. Pt educated about potential effect of keeping foley longer but still refused to discontinue. Dr. Elvina Mattes on the floor and rounded, made aware. Per Dr. Elvina Mattes, okay to keep the foley catheter for now.

## 2017-07-15 LAB — CBC
HCT: 32 % — ABNORMAL LOW (ref 35.0–47.0)
HEMOGLOBIN: 10.7 g/dL — AB (ref 12.0–16.0)
MCH: 28.8 pg (ref 26.0–34.0)
MCHC: 33.4 g/dL (ref 32.0–36.0)
MCV: 86.3 fL (ref 80.0–100.0)
Platelets: 117 10*3/uL — ABNORMAL LOW (ref 150–440)
RBC: 3.7 MIL/uL — AB (ref 3.80–5.20)
RDW: 15.7 % — ABNORMAL HIGH (ref 11.5–14.5)
WBC: 7.3 10*3/uL (ref 3.6–11.0)

## 2017-07-15 LAB — URINALYSIS, COMPLETE (UACMP) WITH MICROSCOPIC
Bilirubin Urine: NEGATIVE
GLUCOSE, UA: NEGATIVE mg/dL
Hgb urine dipstick: NEGATIVE
KETONES UR: NEGATIVE mg/dL
NITRITE: NEGATIVE
PH: 7 (ref 5.0–8.0)
Protein, ur: NEGATIVE mg/dL
Specific Gravity, Urine: 1.005 (ref 1.005–1.030)

## 2017-07-15 LAB — CREATININE, SERUM: CREATININE: 0.78 mg/dL (ref 0.44–1.00)

## 2017-07-15 MED ORDER — DABIGATRAN ETEXILATE MESYLATE 150 MG PO CAPS
150.0000 mg | ORAL_CAPSULE | Freq: Two times a day (BID) | ORAL | Status: DC
  Administered 2017-07-15 – 2017-07-16 (×3): 150 mg via ORAL
  Filled 2017-07-15 (×4): qty 1

## 2017-07-15 NOTE — Progress Notes (Signed)
[place] at [place] NAME: Tina Pacheco    MR#:  [id number]  DATE OF BIRTH:  [birth year]  SUBJECTIVE:   Pain in the right foot has improved since yesterday.  Seen by physical therapy recommending rehab.  Discussed plan of care with podiatry today.  REVIEW OF SYSTEMS:    Review of Systems  Constitutional: Negative for chills and fever.  HENT: Negative for congestion and tinnitus.   Eyes: Negative for blurred vision and double vision.  Respiratory: Negative for cough, shortness of breath and wheezing.   Cardiovascular: Negative for chest pain, orthopnea and PND.  Gastrointestinal: Negative for abdominal pain, diarrhea, nausea and vomiting.  Genitourinary: Negative for dysuria and hematuria.  Musculoskeletal: Positive for joint pain (Right Foot).  Neurological: Negative for dizziness, sensory change and focal weakness.  All other systems reviewed and are negative.   Nutrition: Heart Healthy Tolerating Diet: Yes Tolerating PT: Await Eval.     DRUG ALLERGIES:  No Known Allergies  VITALS:  Blood pressure (!) 135/52, pulse 66, temperature 99.9 F (37.7 C), resp. rate 16, height 5\' 5"  (1.651 m), weight 132.5 kg (292 lb 1.8 oz), SpO2 97 %.  PHYSICAL EXAMINATION:   Physical Exam  GENERAL:  67 y.o.-year-old patient lying in bed in no acute distress.  EYES: Pupils equal, round, reactive to light and accommodation. No scleral icterus. Extraocular muscles intact.  HEENT: Head atraumatic, normocephalic. Oropharynx and nasopharynx clear.  NECK:  Supple, no jugular venous distention. No thyroid enlargement, no tenderness.  LUNGS: Normal breath sounds bilaterally, no wheezing, rales, rhonchi. No use of accessory muscles of respiration.  CARDIOVASCULAR: S1, S2 normal. No murmurs, rubs, or gallops.  ABDOMEN: Soft, nontender, nondistended. Bowel sounds present. No organomegaly or mass.  EXTREMITIES: No cyanosis, clubbing or edema b/l.  Right foot  dressing in place from surgery with no acute drainage.   NEUROLOGIC: Cranial nerves II through XII are intact. No focal Motor or sensory deficits b/l.   PSYCHIATRIC: The patient is alert and oriented x 3.  SKIN: No obvious rash, lesion, or ulcer.    LABORATORY PANEL:   CBC Recent Labs  Lab 07/15/17 0940  WBC 7.3  HGB 10.7*  HCT 32.0*  PLT 117*   ------------------------------------------------------------------------------------------------------------------  Chemistries  Recent Labs  Lab 07/09/17 0810 07/15/17 0940  NA 138  --   K 3.6  --   CL 101  --   CO2 29  --   GLUCOSE 86  --   BUN 27*  --   CREATININE 1.06* 0.78  CALCIUM 9.9  --    ------------------------------------------------------------------------------------------------------------------  Cardiac Enzymes No results for input(s): TROPONINI in the last 168 hours. ------------------------------------------------------------------------------------------------------------------  RADIOLOGY:  No results found.   ASSESSMENT AND PLAN:   67 year old female with past medical history of essential hypertension, osteoarthritis/rheumatoid arthritis, paroxysmal atrial fibrillation, hyperlipidemia, COPD, history of chronic diastolic CHF who presented to the hospital after right foot surgery for debilitating OA/rheumatoid arthritis of the right foot status post multiple screws and plate placements.  1.  Right foot pain-patient is status post multiple screws and plate placement to the right foot secondary to debilitating osteoarthritis/rheumatoid arthritis.   Pain is a little bit better controlled today compared to yesterday.  Continue IV morphine, oxycodone.  Discussed with podiatry and possible discharge to rehab on Oral pain meds in next 24-48 hrs.  -Further care as per podiatry.  2.  Essential hypertension- continue Norvasc, Toprol. - BP stable.   3.  Hyperlipidemia-continue  atorvastatin.  4. Hx of RA - cont.  Arava, Folic Acid, Uloric.   5.  Neuropathy-continue Lyrica.  6. Insomnia - cont. PRN ambien.   PT Eval noted and they are recommending sNF/STR and will make social work aware.   All the records are reviewed and case discussed with Care Management/Social Worker. Management plans discussed with the patient, family and they are in agreement.  CODE STATUS: Full code  DVT Prophylaxis: Lovenox  TOTAL TIME TAKING CARE OF THIS PATIENT: 30 minutes.   POSSIBLE D/C IN 1-2 DAYS, DEPENDING ON CLINICAL CONDITION.   Henreitta Leber M.D on 07/15/2017 at 12:32 PM  Between 7am to 6pm - Pager - [phone]  After 6pm go to [email or web address] - Proofreader  [facility]  Office  [phone]  CC: Primary care physician; Venia Carbon, MD

## 2017-07-15 NOTE — Progress Notes (Signed)
[facility] Podiatry                                                      Patient Demographics  Tina Pacheco, is a 67 y.o. female   MRN: [id number]   DOB - [birth year]  Admit Date - 07/13/2017    Outpatient Primary MD for the patient is Venia Carbon, MD  Consult requested in the Hospital by Henreitta Leber, MD, On 07/15/2017     With History of -  Past Medical History:  Diagnosis Date  . Arthritis    RA  . Asthma   . Basal cell carcinoma   . Cataract 2018   bilateral eyes; corrected with surgery  . Claustrophobia   . COPD (chronic obstructive pulmonary disease) ([place])   . Diastolic dysfunction    a. echo 07/2014: EF 55-60%, no RWMA, GR2DD, mild MR, LA moderately dilated, PASP 38 mm Hg  . Dyslipidemia   . Dysrhythmia   . Headache    migraines  . Hemihypertrophy   . History of cardiac cath    a. 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 History of gout   . Hyperplastic colonic polyp 2003  . Hypertension   . Hypokalemia   . Morbid obesity ([place])   . PAF (paroxysmal atrial fibrillation) (HCC)    a. on Pradaxa; b. CHADSVASc at least 2 (HTN & female)  . Rheumatoid arthritis(714.0)   . Sleep apnea    a. not compliant with CPAP      Past Surgical History:  Procedure Laterality Date  . ABDOMINAL HYSTERECTOMY    . CARDIAC CATHETERIZATION  05/24/2010   nonobstructive CAD  . CATARACT EXTRACTION W/ INTRAOCULAR LENS IMPLANT Right 06/12/2016   Dr. Darleen Crocker  . CATARACT EXTRACTION W/ INTRAOCULAR LENS IMPLANT Left 06/26/2016   Dr. Darleen Crocker  . CESAREAN SECTION    . CHOLECYSTECTOMY    . COLONOSCOPY  06/12/2011   Procedure: COLONOSCOPY;  Surgeon: Juanita Craver, MD;  Location: [facility];  Service: Endoscopy;  Laterality: N/A;  . COLONOSCOPY N/A 03/17/2013   Procedure: COLONOSCOPY;  Surgeon: Juanita Craver, MD;  Location: [facility] ENDOSCOPY;   Service: Endoscopy;  Laterality: N/A;  . EYE SURGERY    . KNEE ARTHROSCOPY     bilateral  . SINUSOTOMY    . TOOTH EXTRACTION  12/2016  . VAGINAL HYSTERECTOMY      in for   No chief complaint on file.    HPI  Tina Pacheco  is a 67 y.o. female, 2 days status post fusions multiple joints right foot and removal of pronounced exostoses and also portion of the fractured navicular bone from the right foot.  She is in the hospital for pain management.  States she still having considerable pain around 7 and 8 but the pain medicines of help keep it in better shape.  States she feels a little better today than yesterday.    Prior to Admission medications   Medication Sig Start Date End Date Taking? Authorizing Provider  amLODipine (NORVASC) 5 MG tablet TAKE 1 TABLET BY MOUTH ONCE DAILY Patient taking differently: Take 5 mgs by mouth once daily in the evening 05/28/17  Yes Viviana Simpler I, MD  atorvastatin (LIPITOR) 20 MG tablet Take 1 tablet (20 mg total) by mouth daily at 6 PM. Patient taking differently:  Take 20 mg by mouth every evening.  05/16/17  Yes Venia Carbon, MD  diphenhydrAMINE (BENADRYL) 25 MG tablet Take 25 mg by mouth daily as needed for allergies.   Yes [provider]  febuxostat (ULORIC) 40 MG tablet Take 40 mg by mouth daily.   Yes [provider]  folic acid (FOLVITE) 1 MG tablet Take 1 mg by mouth daily.   Yes [provider]  hydroxychloroquine (PLAQUENIL) 200 MG tablet Take 200 mg by mouth 2 (two) times daily.    Yes [provider]  KLOR-CON M20 20 MEQ tablet TAKE 1/2 TABLET (=10MEQ)   TWO TIMES A DAY ; MAY TAKE AND EXTRA TABLET WHEN      TAKING TORSEMIDE 01/29/17  Yes Gollan, Kathlene November, MD  leflunomide (ARAVA) 20 MG tablet Take 20 mg by mouth every morning.  09/20/11  Yes [provider]  metoprolol succinate (TOPROL-XL) 50 MG 24 hr tablet Take 1 tablet (50 mg total) by mouth daily. Take with or immediately following a  meal. Patient taking differently: Take 50 mg by mouth every evening. Take with or immediately following a meal. 11/16/16  Yes Lucille Passy, MD  montelukast (SINGULAIR) 10 MG tablet Take 1 tablet (10 mg total) by mouth at bedtime. 11/16/16  Yes Lucille Passy, MD  Multiple Vitamin (MULTIVITAMIN) tablet Take 1 tablet by mouth daily.    Yes [provider]  oxyCODONE (ROXICODONE) 5 MG immediate release tablet Take 2 tablets by mouth every 12 hours 07/02/17  Yes Tower, Wynelle Fanny, MD  PRADAXA 150 MG CAPS capsule TAKE 1 CAPSULE TWICE DAILY 05/28/17  Yes Gollan, Kathlene November, MD  pregabalin (LYRICA) 150 MG capsule Take 150 mg by mouth 3 (three) times daily.   Yes [provider]  terazosin (HYTRIN) 10 MG capsule TAKE 1 CAPSULE BY MOUTH AT BEDTIME 05/16/17  Yes Viviana Simpler I, MD  tolterodine (DETROL LA) 2 MG 24 hr capsule Take 1 capsule (2 mg total) by mouth daily. Patient taking differently: Take 2 mg by mouth daily as needed (bladder spasms).  02/02/16  Yes Lucille Passy, MD  torsemide (DEMADEX) 20 MG tablet take 1 tablet by mouth twice a day for edema Patient taking differently: Take 20 mg by mouth twice daily as needed for swelling 07/28/16  Yes Lucille Passy, MD  ibuprofen (ADVIL,MOTRIN) 200 MG tablet Take 400-600 mg by mouth 2 (two) times daily as needed.    [provider]  SF 5000 PLUS 1.1 % CREA dental cream Place 1 application onto teeth 2 (two) times daily. 06/01/17   [provider]  sodium chloride (OCEAN) 0.65 % SOLN nasal spray Place 1 spray into both nostrils as needed for congestion.    [provider]    Anti-infectives (From admission, onward)   Start     Dose/Rate Route Frequency Ordered Stop   07/13/17 1400  hydroxychloroquine (PLAQUENIL) tablet 200 mg  Status:  Discontinued     200 mg Oral 2 times daily 07/13/17 1315 07/13/17 1617   07/13/17 0615  ceFAZolin (ANCEF) IVPB 2g/100 mL premix     2 g 200 mL/hr over 30 Minutes Intravenous On call to O.R.  07/13/17 0604 07/13/17 0808   07/13/17 0610  ceFAZolin (ANCEF) 2-4 GM/100ML-% IVPB    Note to Pharmacy:  Dewayne Hatch   : cabinet override      07/13/17 0610 07/13/17 0758      Scheduled Meds: . amLODipine  5 mg Oral Daily  .  atorvastatin  20 mg Oral QPM  . dabigatran  150 mg Oral BID  . docusate sodium  100 mg Oral BID  . febuxostat  40 mg Oral Daily  . fesoterodine  4 mg Oral Daily  . folic acid  1 mg Oral Daily  . leflunomide  20 mg Oral [id number]  . metoprolol succinate  50 mg Oral QPM  . montelukast  10 mg Oral QHS  . multivitamin with minerals  1 tablet Oral Daily  . potassium chloride SA  10 mEq Oral Daily  . pregabalin  150 mg Oral TID  . sodium chloride flush  3 mL Intravenous Q12H  . terazosin  10 mg Oral QHS   Continuous Infusions: . sodium chloride     PRN Meds:.sodium chloride, acetaminophen **OR** acetaminophen, bisacodyl, diphenhydrAMINE, morphine injection, ondansetron **OR** ondansetron (ZOFRAN) IV, oxyCODONE, polyethylene glycol, sodium chloride, sodium chloride flush, torsemide, zolpidem  No Known Allergies Vitals  Blood pressure (!) 135/52, pulse 66, temperature 99.9 F (37.7 C), resp. rate 16, height 5\' 5"  (1.651 m), weight 132.5 kg (292 lb 1.8 oz), SpO2 97 %.  Lower Extremity exam: Overall she states the pain is a little less than today than yesterday.  She can wiggle her toes and color to her toes is good.  She has a 1 to 2-second capillary refill time on her digits.  Bandages dry now do not see any fresh bleeding on the bandage.  It appears to have dried blood on it. CBC Recent Labs  Lab 07/09/17 0810  WBC 4.8  HGB 12.9  HCT 38.8  PLT 155  MCV 87.2  MCH 28.9  MCHC 33.2  RDW 16.0*   ------------------------------------------------------------------------------------------------------------------  Chemistries  Recent Labs  Lab 07/09/17 0810  NA 138  K 3.6  CL 101  CO2 29  GLUCOSE 86  BUN 27*  CREATININE 1.06*  CALCIUM 9.9    -----------------------------------------------------------------------------------   Assessment & Plan: Overall think the patient's a little better today than yesterday but still needs IV pain medicine control.  We will leave her current doses of the morphine and oxycodone.  Try to get physical therapy to work with her again later this afternoon and see if she can move her leg a little bit better.  I would like for her to be to get up and transfer to a chair not putting any weight on the right foot she will need assistance and a walker more than likely in order to achieve that.  Also like to see her again on a bedside toilet.  She states she is unable to use a bedpan appropriately.  We will see how she progresses today may try to change some of her pain medicines tomorrow in hopes of being able to discharge her to skilled nursing facility.  Active Problems:   Acute pain in joint   Family Communication: Plan discussed with patient  Albertine Patricia M.D on 07/15/2017 at 9:20 AM

## 2017-07-15 NOTE — Clinical Social Work Note (Signed)
The CSW met with the patient and her husband at bedside to discuss bed offers for SNF placement. The patient has selected [facility], but did request that the CSW contact [facility] tomorrow morning in case they have an opening. If they do, that is her preference due to location. CSW will continue to follow.  Santiago Bumpers, MSW, Latanya Presser [phone]

## 2017-07-15 NOTE — Evaluation (Signed)
Physical Therapy Evaluation Patient Details Name: Tina Pacheco MRN: [id number] DOB: [birth year] Today's Date: 07/15/2017   History of Present Illness  Pt admitted for R foot surgery. Pt is now s/p joint fusions on R ankle including talar navicular jt, navicular cuneiform, 2nt MPT and 3 MT fusions. History includes RA, cardiac cath, COPD, and HTN. CLeared to work with therapy this date, however still remains on bedrest. Per RN may progress to OOB next date. Strict R NWB noted.  Clinical Impression  Pt is a pleasant 67 year old female who was admitted for complex R ankle fusion sx. Pt performs bed mobility including rolling with max assist and unable to tolerate further mobility attempts. Discussed with RN about bed rest orders. Pt demonstrates deficits with strength/pain/mobility. L LE weak as well, gave HEP for performance today with OOB attempts next date. Pt will not let therapist touch R foot or assist with movement/positioning despite education. Agreeable to OOB attempts next date. Is currently not at baseline level. Discussed complete NWB on R LE. Would benefit from skilled PT to address above deficits and promote optimal return to PLOF; recommend transition to STR upon discharge from acute hospitalization.       Follow Up Recommendations SNF    Equipment Recommendations  Rolling walker with 5" wheels    Recommendations for Other Services       Precautions / Restrictions Precautions Precautions: Fall Restrictions Weight Bearing Restrictions: Yes RLE Weight Bearing: Non weight bearing      Mobility  Bed Mobility Overal bed mobility: Needs Assistance Bed Mobility: Rolling Rolling: Max assist         General bed mobility comments: needs assist for rolling to B sides with improved effort towards R side. Does not let therapist touch R leg with mobility efforts. Refused further mobility attempts at this time due to pain  Transfers                 General transfer  comment: unable  Ambulation/Gait             General Gait Details: unable  Stairs            Wheelchair Mobility    Modified Rankin (Stroke Patients Only)       Balance Overall balance assessment: Needs assistance;History of Falls                                           Pertinent Vitals/Pain Pain Assessment: 0-10 Pain Score: 7  Pain Location: R ankle Pain Descriptors / Indicators: Operative site guarding;Pounding;Sharp;Radiating Pain Intervention(s): Limited activity within patient's tolerance;Premedicated before session;Repositioned    Home Living Family/patient expects to be discharged to:: Private residence Living Arrangements: Spouse/significant other Available Help at Discharge: Family Type of Home: House Home Access: Ramped entrance     Home Layout: Multi-level;Able to live on main level with bedroom/bathroom(does not go to basement) Home Equipment: Cane - single point;Walker - 4 wheels      Prior Function Level of Independence: Independent with assistive device(s)         Comments: was ambulatory short distances with rollater in community and in home with SPC. Distance limited due to R foot pain.     Hand Dominance        Extremity/Trunk Assessment   Upper Extremity Assessment Upper Extremity Assessment: Generalized weakness(B UE grossly 4/5)    Lower Extremity  Assessment Lower Extremity Assessment: Generalized weakness(L LE grossly 3+/5; R LE grossly 2/5)       Communication   Communication: No difficulties  Cognition Arousal/Alertness: Awake/alert Behavior During Therapy: WFL for tasks assessed/performed Overall Cognitive Status: Within Functional Limits for tasks assessed                                        General Comments      Exercises Other Exercises Other Exercises: Performed supine ther-ex on L LE including ankle pumps, quad sets, hip abd/add, and SLRs. Also performed on R LE  including knee squeezes and leg raises. Needs mod assist on L and max assist on R LE.   Assessment/Plan    PT Assessment Patient needs continued PT services  PT Problem List Decreased strength;Decreased activity tolerance;Decreased balance;Decreased mobility;Obesity;Pain       PT Treatment Interventions DME instruction;Gait training;Therapeutic exercise;Balance training    PT Goals (Current goals can be found in the Care Plan section)  Acute Rehab PT Goals Patient Stated Goal: to go to rehab PT Goal Formulation: With patient Time For Goal Achievement: 07/29/17 Potential to Achieve Goals: Good    Frequency BID   Barriers to discharge Inaccessible home environment;Decreased caregiver support      Co-evaluation               AM-PAC PT "6 Clicks" Daily Activity  Outcome Measure Difficulty turning over in bed (including adjusting bedclothes, sheets and blankets)?: Unable Difficulty moving from lying on back to sitting on the side of the bed? : Unable Difficulty sitting down on and standing up from a chair with arms (e.g., wheelchair, bedside commode, etc,.)?: Unable Help needed moving to and from a bed to chair (including a wheelchair)?: Total Help needed walking in hospital room?: Total Help needed climbing 3-5 steps with a railing? : Total 6 Click Score: 6    End of Session   Activity Tolerance: Patient limited by pain Patient left: in bed;with bed alarm set;with SCD's reapplied Nurse Communication: Mobility status PT Visit Diagnosis: Muscle weakness (generalized) (M62.81);History of falling (Z91.81);Difficulty in walking, not elsewhere classified (R26.2);Pain Pain - Right/Left: Right Pain - part of body: Ankle and joints of foot    Time: 3664-4034 PT Time Calculation (min) (ACUTE ONLY): 24 min   Charges:   PT Evaluation $PT Eval Moderate Complexity: 1 Mod PT Treatments $Therapeutic Exercise: 8-22 mins   PT G Codes:        Greggory Stallion, PT,  DPT [phone]   Lilibeth Opie 07/15/2017, 10:48 AM

## 2017-07-16 ENCOUNTER — Encounter: Payer: Self-pay | Admitting: Podiatry

## 2017-07-16 DIAGNOSIS — Z6841 Body Mass Index (BMI) 40.0 and over, adult: Secondary | ICD-10-CM | POA: Diagnosis not present

## 2017-07-16 DIAGNOSIS — G4730 Sleep apnea, unspecified: Secondary | ICD-10-CM | POA: Diagnosis present

## 2017-07-16 DIAGNOSIS — Y848 Other medical procedures as the cause of abnormal reaction of the patient, or of later complication, without mention of misadventure at the time of the procedure: Secondary | ICD-10-CM | POA: Diagnosis present

## 2017-07-16 DIAGNOSIS — M25512 Pain in left shoulder: Secondary | ICD-10-CM | POA: Diagnosis not present

## 2017-07-16 DIAGNOSIS — M1990 Unspecified osteoarthritis, unspecified site: Secondary | ICD-10-CM | POA: Diagnosis not present

## 2017-07-16 DIAGNOSIS — F40240 Claustrophobia: Secondary | ICD-10-CM | POA: Diagnosis present

## 2017-07-16 DIAGNOSIS — E669 Obesity, unspecified: Secondary | ICD-10-CM | POA: Diagnosis not present

## 2017-07-16 DIAGNOSIS — G629 Polyneuropathy, unspecified: Secondary | ICD-10-CM | POA: Diagnosis present

## 2017-07-16 DIAGNOSIS — I10 Essential (primary) hypertension: Secondary | ICD-10-CM | POA: Diagnosis not present

## 2017-07-16 DIAGNOSIS — I5032 Chronic diastolic (congestive) heart failure: Secondary | ICD-10-CM | POA: Diagnosis present

## 2017-07-16 DIAGNOSIS — I480 Paroxysmal atrial fibrillation: Secondary | ICD-10-CM | POA: Diagnosis present

## 2017-07-16 DIAGNOSIS — Z7901 Long term (current) use of anticoagulants: Secondary | ICD-10-CM | POA: Diagnosis not present

## 2017-07-16 DIAGNOSIS — T8131XA Disruption of external operation (surgical) wound, not elsewhere classified, initial encounter: Secondary | ICD-10-CM | POA: Diagnosis present

## 2017-07-16 DIAGNOSIS — I110 Hypertensive heart disease with heart failure: Secondary | ICD-10-CM | POA: Diagnosis present

## 2017-07-16 DIAGNOSIS — K5900 Constipation, unspecified: Secondary | ICD-10-CM | POA: Diagnosis present

## 2017-07-16 DIAGNOSIS — E785 Hyperlipidemia, unspecified: Secondary | ICD-10-CM | POA: Diagnosis present

## 2017-07-16 DIAGNOSIS — G8929 Other chronic pain: Secondary | ICD-10-CM | POA: Diagnosis present

## 2017-07-16 DIAGNOSIS — I4891 Unspecified atrial fibrillation: Secondary | ICD-10-CM | POA: Diagnosis not present

## 2017-07-16 DIAGNOSIS — T148XXA Other injury of unspecified body region, initial encounter: Secondary | ICD-10-CM | POA: Diagnosis not present

## 2017-07-16 DIAGNOSIS — I2510 Atherosclerotic heart disease of native coronary artery without angina pectoris: Secondary | ICD-10-CM | POA: Diagnosis present

## 2017-07-16 DIAGNOSIS — Z4789 Encounter for other orthopedic aftercare: Secondary | ICD-10-CM | POA: Diagnosis not present

## 2017-07-16 DIAGNOSIS — Z90710 Acquired absence of both cervix and uterus: Secondary | ICD-10-CM | POA: Diagnosis not present

## 2017-07-16 DIAGNOSIS — N3281 Overactive bladder: Secondary | ICD-10-CM | POA: Diagnosis not present

## 2017-07-16 DIAGNOSIS — Z9119 Patient's noncompliance with other medical treatment and regimen: Secondary | ICD-10-CM | POA: Diagnosis not present

## 2017-07-16 DIAGNOSIS — M79671 Pain in right foot: Secondary | ICD-10-CM | POA: Diagnosis not present

## 2017-07-16 DIAGNOSIS — J449 Chronic obstructive pulmonary disease, unspecified: Secondary | ICD-10-CM | POA: Diagnosis present

## 2017-07-16 DIAGNOSIS — M25571 Pain in right ankle and joints of right foot: Secondary | ICD-10-CM | POA: Diagnosis not present

## 2017-07-16 DIAGNOSIS — R2689 Other abnormalities of gait and mobility: Secondary | ICD-10-CM | POA: Diagnosis not present

## 2017-07-16 DIAGNOSIS — Z85828 Personal history of other malignant neoplasm of skin: Secondary | ICD-10-CM | POA: Diagnosis not present

## 2017-07-16 DIAGNOSIS — B9561 Methicillin susceptible Staphylococcus aureus infection as the cause of diseases classified elsewhere: Secondary | ICD-10-CM | POA: Diagnosis present

## 2017-07-16 DIAGNOSIS — M069 Rheumatoid arthritis, unspecified: Secondary | ICD-10-CM | POA: Diagnosis present

## 2017-07-16 DIAGNOSIS — M19171 Post-traumatic osteoarthritis, right ankle and foot: Secondary | ICD-10-CM | POA: Diagnosis not present

## 2017-07-16 DIAGNOSIS — T8130XA Disruption of wound, unspecified, initial encounter: Secondary | ICD-10-CM | POA: Diagnosis present

## 2017-07-16 DIAGNOSIS — Z7401 Bed confinement status: Secondary | ICD-10-CM | POA: Diagnosis not present

## 2017-07-16 DIAGNOSIS — M6281 Muscle weakness (generalized): Secondary | ICD-10-CM | POA: Diagnosis not present

## 2017-07-16 DIAGNOSIS — L03115 Cellulitis of right lower limb: Secondary | ICD-10-CM | POA: Diagnosis present

## 2017-07-16 DIAGNOSIS — Z9889 Other specified postprocedural states: Secondary | ICD-10-CM | POA: Diagnosis not present

## 2017-07-16 MED ORDER — OXYCODONE HCL 10 MG PO TABS: 10.0000 mg | ORAL_TABLET | ORAL | 0 refills | Status: DC | PRN

## 2017-07-16 MED ORDER — POLYETHYLENE GLYCOL 3350 17 G PO PACK: 17.0000 g | PACK | Freq: Every day | ORAL | 0 refills | Status: AC | PRN

## 2017-07-16 MED ORDER — DOCUSATE SODIUM 100 MG PO CAPS: 100.0000 mg | ORAL_CAPSULE | Freq: Two times a day (BID) | ORAL | 0 refills | Status: DC

## 2017-07-16 NOTE — Clinical Social Work Placement (Signed)
   CLINICAL SOCIAL WORK PLACEMENT  NOTE  Date:  07/16/2017  Patient Details  Name: Tina Pacheco MRN: [id number] Date of Birth: [birth year]  Clinical Social Work is seeking post-discharge placement for this patient at the [place] level of care (*CSW will initial, date and re-position this form in  chart as items are completed):  Yes   Patient/family provided with [place] Work Department's list of facilities offering this level of care within the geographic area requested by the patient (or if unable, by the patient's family).  Yes   Patient/family informed of their freedom to choose among providers that offer the needed level of care, that participate in Medicare, Medicaid or managed care program needed by the patient, have an available bed and are willing to accept the patient.  Yes   Patient/family informed of [facility]'s ownership interest in [facility] and [facility], as well as of the fact that they are under no obligation to receive care at these facilities.  PASRR submitted to EDS on 07/13/17     PASRR number received on 07/13/17     Existing PASRR number confirmed on       FL2 transmitted to all facilities in geographic area requested by pt/family on 07/13/17     FL2 transmitted to all facilities within larger geographic area on       Patient informed that his/her managed care company has contracts with or will negotiate with certain facilities, including the following:        Yes   Patient/family informed of bed offers received.  Patient chooses bed at [facility] )     Physician recommends and patient chooses bed at      Patient to be transferred to [facility] ) on 07/16/17.  Patient to be transferred to facility by [facility] )     Patient family notified on 07/16/17 of transfer.  Name of family member notified:  (Patient's husband [name] is at bedside and aware of D/C today. )     PHYSICIAN        Additional Comment:    _______________________________________________ Ellaina Schuler, Veronia Beets, LCSW 07/16/2017, 12:20 PM

## 2017-07-16 NOTE — Progress Notes (Signed)
Clinical Education officer, museum (CSW) met with patient and her husband [name] to present bed offers. They chose [facility]. Patient is medically stable for D/C to [facility] today. Per Tabitha admissions coordinator at [facility] patient can come today. RN will call report and arrange EMS for transport. CSW sent D/C orders to [facility] via [place]. Patient and her husband [name] are aware of above. Please reconsult if future social work needs arise. CSW signing off.   [employer], LCSW [phone]

## 2017-07-16 NOTE — Progress Notes (Signed)
Called facility for report, Sherrell LPN. EMS notified for transport. BM today. Patient medicated and ready for transfer.

## 2017-07-16 NOTE — Progress Notes (Signed)
Physical Therapy Treatment Patient Details Name: Tina Pacheco MRN: [id number] DOB: 01/13/1951 Today's Date: 07/16/2017    History of Present Illness Pt admitted for R foot surgery. Pt is now s/p joint fusions on R ankle including talar navicular jt, navicular cuneiform, 2nt MPT and 3 MT fusions. History includes RA, cardiac cath, COPD, and HTN. CLeared to work with therapy this date, however still remains on bedrest. Per RN may progress to OOB next date. Strict R NWB noted.    PT Comments    Pt presents with deficits in strength, transfers, mobility, gait, balance, and activity tolerance.  Pt required min A with extensive time and effort including frequent stopping for therapeutic rest breaks during bed mobility tasks.  Multiple attempts made from various bed heights at sit to/from stand transfers with pt able unable to clear the surface of the bed.  Pt was able to perform a sliding board transfer from bed to chair with Mod A and extensive cues for sequencing.  Pt again required extra time and effort along with frequent stopping for therapeutic rest breaks during the transfer process.  Pt will benefit from PT services in a SNF setting upon discharge to safely address above deficits for decreased caregiver assistance and eventual return to PLOF.    Follow Up Recommendations  SNF     Equipment Recommendations       Recommendations for Other Services       Precautions / Restrictions Precautions Precautions: Fall Restrictions Weight Bearing Restrictions: Yes RLE Weight Bearing: Non weight bearing    Mobility  Bed Mobility Overal bed mobility: Needs Assistance Bed Mobility: Supine to Sit;Sit to Supine;Rolling Rolling: Min assist   Supine to sit: Min assist Sit to supine: Min assist   General bed mobility comments: Min A for all bed mobility tasks along with extra time and effort and min verbal cues for sequencing  Transfers Overall transfer level: Needs assistance Equipment  used: Rolling walker (2 wheeled) Transfers: Sit to/from Stand;Lateral/Scoot Transfers Sit to Stand: From elevated surface        Lateral/Scoot Transfers: With slide board;Mod assist;+2 safety/equipment General transfer comment: Multiple attempts at sit to/from stand from elevated EOB with pt unable to clear the surface of the bed; Pt able to perform sliding board transfer with mod A and mod verbal and tactile cues for sequencing  Ambulation/Gait             General Gait Details: unable   Stairs             Wheelchair Mobility    Modified Rankin (Stroke Patients Only)       Balance                                            Cognition Arousal/Alertness: Awake/alert Behavior During Therapy: WFL for tasks assessed/performed Overall Cognitive Status: Within Functional Limits for tasks assessed                                        Exercises Total Joint Exercises Ankle Circles/Pumps: Left;5 reps;10 reps;Strengthening Quad Sets: Strengthening;Both;5 reps;10 reps Gluteal Sets: Strengthening;Both;5 reps;10 reps Heel Slides: AAROM;Left;5 reps Hip ABduction/ADduction: AAROM;Both;10 reps Straight Leg Raises: AAROM;Both;5 reps Long Arc Quad: AROM;Both;10 reps Knee Flexion: AROM;Both;10 reps    General Comments  Pertinent Vitals/Pain Pain Assessment: 0-10 Pain Score: 6  Pain Location: R ankle Pain Descriptors / Indicators: Operative site guarding;Pounding;Sharp;Radiating Pain Intervention(s): Premedicated before session;Monitored during session    Home Living                      Prior Function            PT Goals (current goals can now be found in the care plan section) Progress towards PT goals: Progressing toward goals    Frequency    BID      PT Plan Current plan remains appropriate    Co-evaluation              AM-PAC PT "6 Clicks" Daily Activity  Outcome Measure                    End of Session Equipment Utilized During Treatment: Gait belt Activity Tolerance: Patient tolerated treatment well Patient left: in chair;with chair alarm set;with call bell/phone within reach;with family/visitor present Nurse Communication: Mobility status PT Visit Diagnosis: Muscle weakness (generalized) (M62.81);History of falling (Z91.81);Difficulty in walking, not elsewhere classified (R26.2);Pain Pain - Right/Left: Right Pain - part of body: Ankle and joints of foot     Time: 0911-0952 PT Time Calculation (min) (ACUTE ONLY): 41 min  Charges:  $Therapeutic Exercise: 8-22 mins $Therapeutic Activity: 23-37 mins                    G Codes:       DRoyetta Asal PT, DPT 07/16/17, 12:21 PM

## 2017-07-16 NOTE — Care Management Important Message (Signed)
Important Message  Patient Details  Name: Tina Pacheco MRN: [id number] Date of Birth: May 20, 1950   Medicare Important Message Given:  Yes    [name] A Kaula Klenke 07/16/2017, 11:18 AM

## 2017-07-16 NOTE — Discharge Summary (Signed)
[facility] [place] NAME: Tina Pacheco    MR#:  [id number]  DATE OF BIRTH:  1950-07-11  DATE OF ADMISSION:  07/13/2017 ADMITTING PHYSICIAN: Albertine Patricia, DPM  DATE OF DISCHARGE: 07/16/2017  PRIMARY CARE PHYSICIAN: Venia Carbon, MD    ADMISSION DIAGNOSIS:  Post tramatic osteoarthritis-Right Foot pain-Right  DISCHARGE DIAGNOSIS:  Active Problems:   Acute pain in joint   SECONDARY DIAGNOSIS:   Past Medical History:  Diagnosis Date  . Arthritis    RA  . Asthma   . Basal cell carcinoma   . Cataract 2018   bilateral eyes; corrected with surgery  . Claustrophobia   . COPD (chronic obstructive pulmonary disease) ([place])   . Diastolic dysfunction    a. echo 07/2014: EF 55-60%, no RWMA, GR2DD, mild MR, LA moderately dilated, PASP 38 mm Hg  . Dyslipidemia   . Dysrhythmia   . Headache    migraines  . Hemihypertrophy   . History of cardiac cath    a. 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 History of gout   . Hyperplastic colonic polyp 2003  . Hypertension   . Hypokalemia   . Morbid obesity ([place])   . PAF (paroxysmal atrial fibrillation) ([facility])    a. on Pradaxa; b. CHADSVASc at least 2 (HTN & female)  . Rheumatoid arthritis(714.0)   . Sleep apnea    a. not compliant with CPAP    HOSPITAL COURSE:   67 year old female with past medical history of essential hypertension, osteoarthritis/rheumatoid arthritis, paroxysmal atrial fibrillation, hyperlipidemia, COPD, history of chronic diastolic CHF who presented to the hospital after right foot surgery for debilitating OA/rheumatoid arthritis of the right foot status post multiple screws and plate placements.  1.  Right foot pain-patient is status post multiple screws and plate placement to the right foot POD # 3 today secondary to debilitating osteoarthritis/rheumatoid arthritis.   Patient's pain was uncontrolled and therefore placed on IV morphine along with oxycodone. -Her  pain is better controlled now on just oral medications.  She is therefore being discharged just on oral oxycodone and will continue physical therapy and rehab at short-term rehab. -This was discussed with podiatry who in agreement with this plan.  Patient will follow up with Dr. Elvina Mattes as an outpatient. -Patient is to be nonweightbearing on the right side for the next 6 to 8 weeks until further discussion as per podiatry.  2.  Essential hypertension- she will continue Norvasc, Toprol. - BP stable.   3.  Hyperlipidemia- she will continue atorvastatin.  4. Hx of RA - she will cont. Arava, Folic Acid, Uloric, Plaquenil.   5.  Neuropathy- she will continue Lyrica.  6. Hx of Chronic a. Fib -patient remained rate controlled while in the hospital.  She will continue her Toprol.  Her Pradaxa was initially held due to surgery but it has been since then resumed and she is tolerating it without any acute bleeding.    DISCHARGE CONDITIONS:   Stable  CONSULTS OBTAINED:  Treatment Team:  Albertine Patricia, DPM  DRUG ALLERGIES:  No Known Allergies  DISCHARGE MEDICATIONS:   Allergies as of 07/16/2017   No Known Allergies     Medication List    TAKE these medications   amLODipine 5 MG tablet Commonly known as:  NORVASC TAKE 1 TABLET BY MOUTH ONCE DAILY What changed:    how much to take  how to take this  when to take this   atorvastatin  20 MG tablet Commonly known as:  LIPITOR Take 1 tablet (20 mg total) by mouth daily at 6 PM. What changed:  when to take this   diphenhydrAMINE 25 MG tablet Commonly known as:  BENADRYL Take 25 mg by mouth daily as needed for allergies.   docusate sodium 100 MG capsule Commonly known as:  COLACE Take 1 capsule (100 mg total) by mouth 2 (two) times daily.   febuxostat 40 MG tablet Commonly known as:  ULORIC Take 40 mg by mouth daily.   folic acid 1 MG tablet Commonly known as:  FOLVITE Take 1 mg by mouth daily.    hydroxychloroquine 200 MG tablet Commonly known as:  PLAQUENIL Take 200 mg by mouth 2 (two) times daily.   ibuprofen 200 MG tablet Commonly known as:  ADVIL,MOTRIN Take 400-600 mg by mouth 2 (two) times daily as needed.   KLOR-CON M20 20 MEQ tablet Generic drug:  potassium chloride SA TAKE 1/2 TABLET (=10MEQ)   TWO TIMES A DAY ; MAY TAKE AND EXTRA TABLET WHEN      TAKING TORSEMIDE   leflunomide 20 MG tablet Commonly known as:  ARAVA Take 20 mg by mouth every morning.   metoprolol succinate 50 MG 24 hr tablet Commonly known as:  TOPROL-XL Take 1 tablet (50 mg total) by mouth daily. Take with or immediately following a meal. What changed:    when to take this  additional instructions   montelukast 10 MG tablet Commonly known as:  SINGULAIR Take 1 tablet (10 mg total) by mouth at bedtime.   multivitamin tablet Take 1 tablet by mouth daily.   Oxycodone HCl 10 MG Tabs Commonly known as:  ROXICODONE Take 1 tablet (10 mg total) by mouth every 4 (four) hours as needed for moderate pain. Take 2 tablets by mouth every 12 hours What changed:    medication strength  how much to take  how to take this  when to take this  reasons to take this   polyethylene glycol packet Commonly known as:  MIRALAX / GLYCOLAX Take 17 g by mouth daily as needed for mild constipation.   PRADAXA 150 MG Caps capsule Generic drug:  dabigatran TAKE 1 CAPSULE TWICE DAILY   pregabalin 150 MG capsule Commonly known as:  LYRICA Take 150 mg by mouth 3 (three) times daily.   SF 5000 PLUS 1.1 % Crea dental cream Generic drug:  sodium fluoride Place 1 application onto teeth 2 (two) times daily.   sodium chloride 0.65 % Soln nasal spray Commonly known as:  OCEAN Place 1 spray into both nostrils as needed for congestion.   terazosin 10 MG capsule Commonly known as:  HYTRIN TAKE 1 CAPSULE BY MOUTH AT BEDTIME   tolterodine 2 MG 24 hr capsule Commonly known as:  DETROL LA Take 1 capsule (2  mg total) by mouth daily. What changed:    when to take this  reasons to take this   torsemide 20 MG tablet Commonly known as:  DEMADEX take 1 tablet by mouth twice a day for edema What changed:  See the new instructions.         DISCHARGE INSTRUCTIONS:   DIET:  Cardiac diet  DISCHARGE CONDITION:  Stable  ACTIVITY:  Activity as tolerated  OXYGEN:  Home Oxygen: No.   Oxygen Delivery: room air  DISCHARGE LOCATION:  nursing home   If you experience worsening of your admission symptoms, develop shortness of breath, life threatening emergency, suicidal or homicidal thoughts you  must seek medical attention immediately by calling 911 or calling your MD immediately  if symptoms less severe.  You Must read complete instructions/literature along with all the possible adverse reactions/side effects for all the Medicines you take and that have been prescribed to you. Take any new Medicines after you have completely understood and accpet all the possible adverse reactions/side effects.   Please note  You were cared for by a hospitalist during your hospital stay. If you have any questions about your discharge medications or the care you received while you were in the hospital after you are discharged, you can call the unit and asked to speak with the hospitalist on call if the hospitalist that took care of you is not available. Once you are discharged, your primary care physician will handle any further medical issues. Please note that NO REFILLS for any discharge medications will be authorized once you are discharged, as it is imperative that you return to your primary care physician (or establish a relationship with a primary care physician if you do not have one) for your aftercare needs so that they can reassess your need for medications and monitor your lab values.     Today   No acute events overnight.  Pain is better controlled with Oral Oxycodone. Pt. Was having some  urinary frequency yesterday but UA is (-) for UTI.  Will d/c to SNF today for ongoing rehab.   VITAL SIGNS:  Blood pressure (!) 129/55, pulse 64, temperature 98.2 F (36.8 C), temperature source Oral, resp. rate 18, height 5\' 5"  (1.651 m), weight 132.5 kg (292 lb 1.8 oz), SpO2 96 %.  I/O:    Intake/Output Summary (Last 24 hours) at 07/16/2017 1116 Last data filed at 07/16/2017 0454 Gross per 24 hour  Intake 960 ml  Output 1700 ml  Net -740 ml    PHYSICAL EXAMINATION:   GENERAL:  67 y.o.-year-old patient lying in bed in no acute distress.  EYES: Pupils equal, round, reactive to light and accommodation. No scleral icterus. Extraocular muscles intact.  HEENT: Head atraumatic, normocephalic. Oropharynx and nasopharynx clear.  NECK:  Supple, no jugular venous distention. No thyroid enlargement, no tenderness.  LUNGS: Normal breath sounds bilaterally, no wheezing, rales, rhonchi. No use of accessory muscles of respiration.  CARDIOVASCULAR: S1, S2 normal. No murmurs, rubs, or gallops.  ABDOMEN: Soft, nontender, nondistended. Bowel sounds present. No organomegaly or mass.  EXTREMITIES: No cyanosis, clubbing or edema b/l.  Right foot dressing in place from surgery with no acute drainage.   NEUROLOGIC: Cranial nerves II through XII are intact. No focal Motor or sensory deficits b/l.   PSYCHIATRIC: The patient is alert and oriented x 3.  SKIN: No obvious rash, lesion, or ulcer.   DATA REVIEW:   CBC Recent Labs  Lab 07/15/17 0940  WBC 7.3  HGB 10.7*  HCT 32.0*  PLT 117*    Chemistries  Recent Labs  Lab 07/15/17 0940  CREATININE 0.78    Cardiac Enzymes No results for input(s): TROPONINI in the last 168 hours.  RADIOLOGY:  No results found.    Management plans discussed with the patient, family and they are in agreement.  CODE STATUS:     Code Status Orders  (From admission, onward)        Start     Ordered   07/13/17 1316  Full code  Continuous     07/13/17 1315     Code Status History    Date Active Date Inactive Code  Status Order ID Comments User Context   08/10/2014 0200 08/11/2014 1605 Full Code [id number]  Lance Coon, MD Inpatient      TOTAL TIME TAKING CARE OF THIS PATIENT: 40 minutes.    Henreitta Leber M.D on 07/16/2017 at 11:16 AM  Between 7am to 6pm - Pager - [phone]  After 6pm go to [email or web address] - Proofreader  [facility]  Office  [phone]  CC: Primary care physician; Venia Carbon, MD

## 2017-07-16 NOTE — Progress Notes (Signed)
Physical Therapy Treatment Patient Details Name: Tina Pacheco MRN: [id number] DOB: 1950-09-08 Today's Date: 07/16/2017    History of Present Illness Pt admitted for R foot surgery. Pt is now s/p joint fusions on R ankle including talar navicular jt, navicular cuneiform, 2nt MPT and 3 MT fusions. History includes RA, cardiac cath, COPD, and HTN. CLeared to work with therapy this date, however still remains on bedrest. Per RN may progress to OOB next date. Strict R NWB noted.    PT Comments    Pt presents with deficits in strength, transfers, mobility, gait, balance, and activity tolerance.  Pt required decreased assistance with sliding board transfer this session from chair to bed with Min A provided along with cues for sequencing to maintain RLE NWB status.  Pt continues to require extra time and effort during transfers and bed mobility tasks but showed good effort and motivation to participate and is slowly progressing towards goals.  Pt will benefit from PT services in a SNF setting upon discharge to safely address above deficits for decreased caregiver assistance and eventual return to PLOF.     Follow Up Recommendations  SNF     Equipment Recommendations       Recommendations for Other Services       Precautions / Restrictions Precautions Precautions: Fall Restrictions Weight Bearing Restrictions: Yes RLE Weight Bearing: Non weight bearing    Mobility  Bed Mobility Overal bed mobility: Needs Assistance Bed Mobility: Sit to Supine Rolling: Min assist   Supine to sit: Min assist Sit to supine: Min assist   General bed mobility comments: Min A for sit to sup with extra time and effort and min verbal cues for sequencing  Transfers Overall transfer level: Needs assistance Equipment used: Rolling walker (2 wheeled) Transfers: Sit to/from Stand;Lateral/Scoot Transfers Sit to Stand: From elevated surface        Lateral/Scoot Transfers: Min assist;With International aid/development worker transfer comment: Min A and mod verbal cues for sequencing during sliding board transfer training  Ambulation/Gait             General Gait Details: unable   Stairs             Wheelchair Mobility    Modified Rankin (Stroke Patients Only)       Balance                                            Cognition Arousal/Alertness: Awake/alert Behavior During Therapy: WFL for tasks assessed/performed Overall Cognitive Status: Within Functional Limits for tasks assessed                                        Exercises Total Joint Exercises Ankle Circles/Pumps: Left;5 reps;10 reps;Strengthening Quad Sets: Strengthening;Both;5 reps;10 reps Gluteal Sets: Strengthening;Both;5 reps;10 reps Heel Slides: AAROM;Left;5 reps Hip ABduction/ADduction: AAROM;Both;10 reps Straight Leg Raises: AAROM;Both;5 reps Long Arc Quad: AROM;Both;10 reps Knee Flexion: AROM;Both;10 reps    General Comments        Pertinent Vitals/Pain Pain Assessment: 0-10 Pain Score: 7  Pain Location: R ankle Pain Descriptors / Indicators: Operative site guarding;Pounding;Sharp;Radiating Pain Intervention(s): Premedicated before session;Monitored during session    Home Living  Prior Function            PT Goals (current goals can now be found in the care plan section) Progress towards PT goals: Progressing toward goals    Frequency    BID      PT Plan Current plan remains appropriate    Co-evaluation              AM-PAC PT "6 Clicks" Daily Activity  Outcome Measure                   End of Session Equipment Utilized During Treatment: Gait belt Activity Tolerance: Patient tolerated treatment well Patient left: in bed;with bed alarm set;with family/visitor present;with call bell/phone within reach Nurse Communication: Mobility status PT Visit Diagnosis: Muscle weakness (generalized)  (M62.81);History of falling (Z91.81);Difficulty in walking, not elsewhere classified (R26.2);Pain Pain - Right/Left: Right Pain - part of body: Ankle and joints of foot     Time: 8184-0375 PT Time Calculation (min) (ACUTE ONLY): 17 min  Charges:  $Therapeutic Exercise: 8-22 mins $Therapeutic Activity: 8-22 mins                    G Codes:       DRoyetta Asal PT, DPT 07/16/17, 12:27 PM

## 2017-07-16 NOTE — Progress Notes (Signed)
[facility] ([place]) Podiatry                                                      Patient Demographics  Tina Pacheco, is a 67 y.o. female   MRN: [id number]   DOB - [birth year]  Admit Date - 07/13/2017    Outpatient Primary MD for the patient is Venia Carbon, MD  Consult requested in the Hospital by Henreitta Leber, MD, On 07/16/2017  With History of -  Past Medical History:  Diagnosis Date  . Arthritis    RA  . Asthma   . Basal cell carcinoma   . Cataract 2018   bilateral eyes; corrected with surgery  . Claustrophobia   . COPD (chronic obstructive pulmonary disease) ([place])   . Diastolic dysfunction    a. echo 07/2014: EF 55-60%, no RWMA, GR2DD, mild MR, LA moderately dilated, PASP 38 mm Hg  . Dyslipidemia   . Dysrhythmia   . Headache    migraines  . Hemihypertrophy   . History of cardiac cath    a. 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 History of gout   . Hyperplastic colonic polyp 2003  . Hypertension   . Hypokalemia   . Morbid obesity ([place])   . PAF (paroxysmal atrial fibrillation) (HCC)    a. on Pradaxa; b. CHADSVASc at least 2 (HTN & female)  . Rheumatoid arthritis(714.0)   . Sleep apnea    a. not compliant with CPAP      Past Surgical History:  Procedure Laterality Date  . ABDOMINAL HYSTERECTOMY    . CARDIAC CATHETERIZATION  05/24/2010   nonobstructive CAD  . CATARACT EXTRACTION W/ INTRAOCULAR LENS IMPLANT Right 06/12/2016   Dr. Darleen Crocker  . CATARACT EXTRACTION W/ INTRAOCULAR LENS IMPLANT Left 06/26/2016   Dr. Darleen Crocker  . CESAREAN SECTION    . CHOLECYSTECTOMY    . COLONOSCOPY  06/12/2011   Procedure: COLONOSCOPY;  Surgeon: Juanita Craver, MD;  Location: [facility];  Service: Endoscopy;  Laterality: N/A;  . COLONOSCOPY N/A 03/17/2013   Procedure: COLONOSCOPY;  Surgeon: Juanita Craver, MD;  Location: [facility];  Service:  Endoscopy;  Laterality: N/A;  . EYE SURGERY    . FOOT ARTHRODESIS Right 07/13/2017   Procedure: ARTHRODESIS FOOT-MULTI.FUSIONS (6 JOINTS);  Surgeon: Albertine Patricia, DPM;  Location: [facility] ORS;  Service: Podiatry;  Laterality: Right;  . KNEE ARTHROSCOPY     bilateral  . SINUSOTOMY    . TOOTH EXTRACTION  12/2016  . VAGINAL HYSTERECTOMY     Systems were negative.   Social History Social History   Tobacco Use  . Smoking status: Never Smoker  . Smokeless tobacco: Never Used  Substance Use Topics  . Alcohol use: No    Family History Family History  Problem Relation Age of Onset  . Emphysema Father        smoked  . Tuberculosis Mother   . Parkinsonism Mother   . Diabetes type II Sister   . Breast cancer Sister   . Breast cancer Maternal Aunt   . Tuberculosis Sister     Prior to Admission medications   Medication Sig Start Date End Date Taking? Authorizing Provider  amLODipine (NORVASC) 5 MG tablet TAKE 1 TABLET BY MOUTH ONCE DAILY Patient taking differently: Take 5 mgs by mouth once daily in the  evening 05/28/17  Yes Venia Carbon, MD  atorvastatin (LIPITOR) 20 MG tablet Take 1 tablet (20 mg total) by mouth daily at 6 PM. Patient taking differently: Take 20 mg by mouth every evening.  05/16/17  Yes Venia Carbon, MD  diphenhydrAMINE (BENADRYL) 25 MG tablet Take 25 mg by mouth daily as needed for allergies.   Yes [provider]  febuxostat (ULORIC) 40 MG tablet Take 40 mg by mouth daily.   Yes [provider]  folic acid (FOLVITE) 1 MG tablet Take 1 mg by mouth daily.   Yes [provider]  hydroxychloroquine (PLAQUENIL) 200 MG tablet Take 200 mg by mouth 2 (two) times daily.    Yes [provider]  KLOR-CON M20 20 MEQ tablet TAKE 1/2 TABLET (=10MEQ)   TWO TIMES A DAY ; MAY TAKE AND EXTRA TABLET WHEN      TAKING TORSEMIDE 01/29/17  Yes Gollan, Kathlene November, MD  leflunomide (ARAVA) 20 MG tablet Take 20 mg by mouth every morning.  09/20/11  Yes  [provider]  metoprolol succinate (TOPROL-XL) 50 MG 24 hr tablet Take 1 tablet (50 mg total) by mouth daily. Take with or immediately following a meal. Patient taking differently: Take 50 mg by mouth every evening. Take with or immediately following a meal. 11/16/16  Yes Lucille Passy, MD  montelukast (SINGULAIR) 10 MG tablet Take 1 tablet (10 mg total) by mouth at bedtime. 11/16/16  Yes Lucille Passy, MD  Multiple Vitamin (MULTIVITAMIN) tablet Take 1 tablet by mouth daily.    Yes [provider]  PRADAXA 150 MG CAPS capsule TAKE 1 CAPSULE TWICE DAILY 05/28/17  Yes Gollan, Kathlene November, MD  pregabalin (LYRICA) 150 MG capsule Take 150 mg by mouth 3 (three) times daily.   Yes [provider]  terazosin (HYTRIN) 10 MG capsule TAKE 1 CAPSULE BY MOUTH AT BEDTIME 05/16/17  Yes Viviana Simpler I, MD  tolterodine (DETROL LA) 2 MG 24 hr capsule Take 1 capsule (2 mg total) by mouth daily. Patient taking differently: Take 2 mg by mouth daily as needed (bladder spasms).  02/02/16  Yes Lucille Passy, MD  torsemide (DEMADEX) 20 MG tablet take 1 tablet by mouth twice a day for edema Patient taking differently: Take 20 mg by mouth twice daily as needed for swelling 07/28/16  Yes Lucille Passy, MD  docusate sodium (COLACE) 100 MG capsule Take 1 capsule (100 mg total) by mouth 2 (two) times daily. 07/16/17   Henreitta Leber, MD  ibuprofen (ADVIL,MOTRIN) 200 MG tablet Take 400-600 mg by mouth 2 (two) times daily as needed.    [provider]  Oxycodone HCl 10 MG TABS Take 1 tablet (10 mg total) by mouth every 4 (four) hours as needed for moderate pain. Take 2 tablets by mouth every 12 hours 07/16/17   Henreitta Leber, MD  polyethylene glycol (MIRALAX / GLYCOLAX) packet Take 17 g by mouth daily as needed for mild constipation. 07/16/17   Sainani, Belia Heman, MD  SF 5000 PLUS 1.1 % CREA dental cream Place 1 application onto teeth 2 (two) times daily. 06/01/17   [provider]  sodium  chloride (OCEAN) 0.65 % SOLN nasal spray Place 1 spray into both nostrils as needed for congestion.    [provider]    Anti-infectives (From admission, onward)   Start     Dose/Rate Route Frequency Ordered Stop   07/13/17 1400  hydroxychloroquine (PLAQUENIL) tablet 200 mg  Status:  Discontinued     200 mg Oral 2 times daily 07/13/17 1315 07/13/17 1617   07/13/17 0615  ceFAZolin (ANCEF) IVPB 2g/100 mL premix     2 g 200 mL/hr over 30 Minutes Intravenous On call to O.R. 07/13/17 0604 07/13/17 0808   07/13/17 0610  ceFAZolin (ANCEF) 2-4 GM/100ML-% IVPB    Note to Pharmacy:  Dewayne Hatch   : cabinet override      07/13/17 0610 07/13/17 0758      Scheduled Meds: . amLODipine  5 mg Oral Daily  . atorvastatin  20 mg Oral QPM  . dabigatran  150 mg Oral BID  . docusate sodium  100 mg Oral BID  . febuxostat  40 mg Oral Daily  . fesoterodine  4 mg Oral Daily  . folic acid  1 mg Oral Daily  . leflunomide  20 mg Oral [id number]  . metoprolol succinate  50 mg Oral QPM  . montelukast  10 mg Oral QHS  . multivitamin with minerals  1 tablet Oral Daily  . potassium chloride SA  10 mEq Oral Daily  . pregabalin  150 mg Oral TID  . sodium chloride flush  3 mL Intravenous Q12H  . terazosin  10 mg Oral QHS   Continuous Infusions: . sodium chloride     PRN Meds:.sodium chloride, acetaminophen **OR** acetaminophen, bisacodyl, diphenhydrAMINE, morphine injection, ondansetron **OR** ondansetron (ZOFRAN) IV, oxyCODONE, polyethylene glycol, sodium chloride, sodium chloride flush, torsemide, zolpidem  No Known Allergies  Physical Exam  Vitals  Blood pressure (!) 129/55, pulse 64, temperature 98.2 F (36.8 C), temperature source Oral, resp. rate 18, height 5\' 5"  (1.651 m), weight 132.5 kg (292 lb 1.8 oz), SpO2 96 %.  Lower Extremity exam: Patient is doing much better today.  Her pain is under much more control and she is been able to go for much longer periods of time before she needs pain  medicine.  She is mostly shifted to oral swelling at the point now where we can let her go to the skilled nursing facility.  Dressing was removed today and inspected the incision margins are intact stable and dry there is a small fracture blister type of finding on the dorsum of the foot but is only about a centimeter in diameter.  It is not erythematous or red around the region.  Does not appear to be purulent.  Patient is able to move her foot and leg much better now without the degree of pain she was having prior.  Physical therapy is gone fairly well and she is improving with her ability to transfer. Data Review  CBC Recent Labs  Lab 07/15/17 0940  WBC 7.3  HGB 10.7*  HCT 32.0*  PLT 117*  MCV 86.3  MCH 28.8  MCHC 33.4  RDW 15.7*   ------------------------------------------------------------------------------------------------------------------  Chemistries  Recent Labs  Lab 07/15/17 0940  CREATININE 0.78   ------------------------------------------------------------------------------------------------------------------ estimated creatinine clearance is 93.9 mL/min (by C-G formula based on SCr of 0.78 mg/dL). ------------------------------------------------------------------------------------------------------------------ No results for input(s): TSH, T4TOTAL, T3FREE, THYROIDAB in the last 72 hours.  Invalid input(s): FREET3 Urinalysis    Component Value Date/Time   COLORURINE STRAW (A) 07/15/2017 1739   APPEARANCEUR CLOUDY (A) 07/15/2017 1739   APPEARANCEUR Cloudy 03/28/2014 1033   LABSPEC 1.005 07/15/2017 1739   LABSPEC 1.018 03/28/2014 1033   PHURINE 7.0 07/15/2017 1739   GLUCOSEU NEGATIVE 07/15/2017 1739   GLUCOSEU Negative 03/28/2014 1033   HGBUR NEGATIVE 07/15/2017 1739   BILIRUBINUR NEGATIVE 07/15/2017  [place] Negative 03/28/2014 [place] 07/15/2017 1739   PROTEINUR NEGATIVE 07/15/2017 1739   NITRITE NEGATIVE 07/15/2017 1739    LEUKOCYTESUR MODERATE (A) 07/15/2017 1739   LEUKOCYTESUR 3+ 03/28/2014 1033    Assessment & Plan: Overall the foot appears to be stable.  I redressed that area today with a new sterile compressive dressing but I put the old splint back on with Ace wrap since she still to remain completely nonweightbearing with this.  She should be eligible for discharge today to skilled nursing facility and I will see her next week on Tuesday or Wednesday will be fine.  Active Problems:   Acute pain in joint   Family Communication: Plan discussed with patient   Albertine Patricia M.D on 07/16/2017 at 1:20 PM  Thank you for the consult, we will follow the patient with you in the Hospital.

## 2017-07-17 DIAGNOSIS — Z9889 Other specified postprocedural states: Secondary | ICD-10-CM | POA: Diagnosis not present

## 2017-07-17 DIAGNOSIS — M1990 Unspecified osteoarthritis, unspecified site: Secondary | ICD-10-CM | POA: Diagnosis not present

## 2017-07-17 DIAGNOSIS — M069 Rheumatoid arthritis, unspecified: Secondary | ICD-10-CM | POA: Diagnosis not present

## 2017-07-17 LAB — SURGICAL PATHOLOGY

## 2017-07-18 DIAGNOSIS — N3281 Overactive bladder: Secondary | ICD-10-CM | POA: Diagnosis not present

## 2017-07-18 DIAGNOSIS — R2689 Other abnormalities of gait and mobility: Secondary | ICD-10-CM | POA: Diagnosis not present

## 2017-07-18 DIAGNOSIS — M25571 Pain in right ankle and joints of right foot: Secondary | ICD-10-CM | POA: Diagnosis not present

## 2017-07-20 DIAGNOSIS — N3281 Overactive bladder: Secondary | ICD-10-CM | POA: Diagnosis not present

## 2017-07-20 DIAGNOSIS — K5900 Constipation, unspecified: Secondary | ICD-10-CM | POA: Diagnosis not present

## 2017-07-20 DIAGNOSIS — M25571 Pain in right ankle and joints of right foot: Secondary | ICD-10-CM | POA: Diagnosis not present

## 2017-07-20 DIAGNOSIS — R2689 Other abnormalities of gait and mobility: Secondary | ICD-10-CM | POA: Diagnosis not present

## 2017-07-24 DIAGNOSIS — Z9889 Other specified postprocedural states: Secondary | ICD-10-CM | POA: Diagnosis not present

## 2017-07-24 DIAGNOSIS — I10 Essential (primary) hypertension: Secondary | ICD-10-CM | POA: Diagnosis not present

## 2017-07-25 DIAGNOSIS — M19171 Post-traumatic osteoarthritis, right ankle and foot: Secondary | ICD-10-CM | POA: Diagnosis not present

## 2017-07-27 DIAGNOSIS — N3281 Overactive bladder: Secondary | ICD-10-CM | POA: Diagnosis not present

## 2017-07-27 DIAGNOSIS — M25571 Pain in right ankle and joints of right foot: Secondary | ICD-10-CM | POA: Diagnosis not present

## 2017-07-27 DIAGNOSIS — R2689 Other abnormalities of gait and mobility: Secondary | ICD-10-CM | POA: Diagnosis not present

## 2017-07-27 DIAGNOSIS — K5900 Constipation, unspecified: Secondary | ICD-10-CM | POA: Diagnosis not present

## 2017-07-31 DIAGNOSIS — N3281 Overactive bladder: Secondary | ICD-10-CM | POA: Diagnosis not present

## 2017-07-31 DIAGNOSIS — R2689 Other abnormalities of gait and mobility: Secondary | ICD-10-CM | POA: Diagnosis not present

## 2017-07-31 DIAGNOSIS — M25571 Pain in right ankle and joints of right foot: Secondary | ICD-10-CM | POA: Diagnosis not present

## 2017-08-03 DIAGNOSIS — R2689 Other abnormalities of gait and mobility: Secondary | ICD-10-CM | POA: Diagnosis not present

## 2017-08-03 DIAGNOSIS — I10 Essential (primary) hypertension: Secondary | ICD-10-CM | POA: Diagnosis not present

## 2017-08-03 DIAGNOSIS — M25571 Pain in right ankle and joints of right foot: Secondary | ICD-10-CM | POA: Diagnosis not present

## 2017-08-03 DIAGNOSIS — Z9889 Other specified postprocedural states: Secondary | ICD-10-CM | POA: Diagnosis not present

## 2017-08-03 DIAGNOSIS — N3281 Overactive bladder: Secondary | ICD-10-CM | POA: Diagnosis not present

## 2017-08-07 DIAGNOSIS — M25571 Pain in right ankle and joints of right foot: Secondary | ICD-10-CM | POA: Diagnosis not present

## 2017-08-07 DIAGNOSIS — M25512 Pain in left shoulder: Secondary | ICD-10-CM | POA: Diagnosis not present

## 2017-08-07 DIAGNOSIS — R2689 Other abnormalities of gait and mobility: Secondary | ICD-10-CM | POA: Diagnosis not present

## 2017-08-07 DIAGNOSIS — N3281 Overactive bladder: Secondary | ICD-10-CM | POA: Diagnosis not present

## 2017-08-08 ENCOUNTER — Inpatient Hospital Stay
Admission: AD | Admit: 2017-08-08 | Discharge: 2017-08-11 | DRG: 902 | Disposition: A | Discharge status: Skilled Nursing Facility | Payer: Medicare Other | Source: Ambulatory Visit | Attending: Internal Medicine | Admitting: Internal Medicine

## 2017-08-08 ENCOUNTER — Other Ambulatory Visit: Payer: Self-pay

## 2017-08-08 DIAGNOSIS — L0390 Cellulitis, unspecified: Secondary | ICD-10-CM | POA: Diagnosis present

## 2017-08-08 DIAGNOSIS — L97413 Non-pressure chronic ulcer of right heel and midfoot with necrosis of muscle: Secondary | ICD-10-CM | POA: Diagnosis not present

## 2017-08-08 DIAGNOSIS — G4730 Sleep apnea, unspecified: Secondary | ICD-10-CM | POA: Diagnosis present

## 2017-08-08 DIAGNOSIS — G629 Polyneuropathy, unspecified: Secondary | ICD-10-CM | POA: Diagnosis present

## 2017-08-08 DIAGNOSIS — Z85828 Personal history of other malignant neoplasm of skin: Secondary | ICD-10-CM

## 2017-08-08 DIAGNOSIS — I5032 Chronic diastolic (congestive) heart failure: Secondary | ICD-10-CM | POA: Diagnosis present

## 2017-08-08 DIAGNOSIS — Z4789 Encounter for other orthopedic aftercare: Secondary | ICD-10-CM | POA: Diagnosis not present

## 2017-08-08 DIAGNOSIS — K5900 Constipation, unspecified: Secondary | ICD-10-CM | POA: Diagnosis present

## 2017-08-08 DIAGNOSIS — T8131XA Disruption of external operation (surgical) wound, not elsewhere classified, initial encounter: Secondary | ICD-10-CM | POA: Diagnosis present

## 2017-08-08 DIAGNOSIS — L03115 Cellulitis of right lower limb: Secondary | ICD-10-CM | POA: Diagnosis present

## 2017-08-08 DIAGNOSIS — I480 Paroxysmal atrial fibrillation: Secondary | ICD-10-CM | POA: Diagnosis not present

## 2017-08-08 DIAGNOSIS — Z7901 Long term (current) use of anticoagulants: Secondary | ICD-10-CM

## 2017-08-08 DIAGNOSIS — Y848 Other medical procedures as the cause of abnormal reaction of the patient, or of later complication, without mention of misadventure at the time of the procedure: Secondary | ICD-10-CM | POA: Diagnosis present

## 2017-08-08 DIAGNOSIS — J449 Chronic obstructive pulmonary disease, unspecified: Secondary | ICD-10-CM | POA: Diagnosis present

## 2017-08-08 DIAGNOSIS — I10 Essential (primary) hypertension: Secondary | ICD-10-CM | POA: Diagnosis not present

## 2017-08-08 DIAGNOSIS — Z9119 Patient's noncompliance with other medical treatment and regimen: Secondary | ICD-10-CM | POA: Diagnosis not present

## 2017-08-08 DIAGNOSIS — M069 Rheumatoid arthritis, unspecified: Secondary | ICD-10-CM | POA: Diagnosis present

## 2017-08-08 DIAGNOSIS — I2510 Atherosclerotic heart disease of native coronary artery without angina pectoris: Secondary | ICD-10-CM | POA: Diagnosis present

## 2017-08-08 DIAGNOSIS — J45909 Unspecified asthma, uncomplicated: Secondary | ICD-10-CM | POA: Diagnosis not present

## 2017-08-08 DIAGNOSIS — E669 Obesity, unspecified: Secondary | ICD-10-CM | POA: Diagnosis not present

## 2017-08-08 DIAGNOSIS — B9561 Methicillin susceptible Staphylococcus aureus infection as the cause of diseases classified elsewhere: Secondary | ICD-10-CM | POA: Diagnosis present

## 2017-08-08 DIAGNOSIS — F40240 Claustrophobia: Secondary | ICD-10-CM | POA: Diagnosis present

## 2017-08-08 DIAGNOSIS — M1990 Unspecified osteoarthritis, unspecified site: Secondary | ICD-10-CM | POA: Diagnosis not present

## 2017-08-08 DIAGNOSIS — Z7401 Bed confinement status: Secondary | ICD-10-CM | POA: Diagnosis not present

## 2017-08-08 DIAGNOSIS — Z6841 Body Mass Index (BMI) 40.0 and over, adult: Secondary | ICD-10-CM | POA: Diagnosis not present

## 2017-08-08 DIAGNOSIS — N3281 Overactive bladder: Secondary | ICD-10-CM | POA: Diagnosis not present

## 2017-08-08 DIAGNOSIS — I110 Hypertensive heart disease with heart failure: Secondary | ICD-10-CM | POA: Diagnosis not present

## 2017-08-08 DIAGNOSIS — M79671 Pain in right foot: Secondary | ICD-10-CM | POA: Diagnosis not present

## 2017-08-08 DIAGNOSIS — T8130XA Disruption of wound, unspecified, initial encounter: Secondary | ICD-10-CM | POA: Diagnosis present

## 2017-08-08 DIAGNOSIS — E785 Hyperlipidemia, unspecified: Secondary | ICD-10-CM | POA: Diagnosis present

## 2017-08-08 DIAGNOSIS — Z90710 Acquired absence of both cervix and uterus: Secondary | ICD-10-CM

## 2017-08-08 DIAGNOSIS — G8929 Other chronic pain: Secondary | ICD-10-CM | POA: Diagnosis present

## 2017-08-08 DIAGNOSIS — T148XXA Other injury of unspecified body region, initial encounter: Secondary | ICD-10-CM | POA: Diagnosis not present

## 2017-08-08 DIAGNOSIS — L089 Local infection of the skin and subcutaneous tissue, unspecified: Secondary | ICD-10-CM | POA: Diagnosis not present

## 2017-08-08 DIAGNOSIS — M6281 Muscle weakness (generalized): Secondary | ICD-10-CM | POA: Diagnosis not present

## 2017-08-08 DIAGNOSIS — R2689 Other abnormalities of gait and mobility: Secondary | ICD-10-CM | POA: Diagnosis not present

## 2017-08-08 LAB — URINALYSIS, COMPLETE (UACMP) WITH MICROSCOPIC
BILIRUBIN URINE: NEGATIVE
GLUCOSE, UA: NEGATIVE mg/dL
Hgb urine dipstick: NEGATIVE
KETONES UR: NEGATIVE mg/dL
NITRITE: NEGATIVE
PROTEIN: NEGATIVE mg/dL
Specific Gravity, Urine: 1.008 (ref 1.005–1.030)
Squamous Epithelial / LPF: NONE SEEN (ref 0–5)
pH: 7 (ref 5.0–8.0)

## 2017-08-08 LAB — CBC
HCT: 35.8 % (ref 35.0–47.0)
Hemoglobin: 11.9 g/dL — ABNORMAL LOW (ref 12.0–16.0)
MCH: 28.9 pg (ref 26.0–34.0)
MCHC: 33.2 g/dL (ref 32.0–36.0)
MCV: 86.8 fL (ref 80.0–100.0)
PLATELETS: 173 10*3/uL (ref 150–440)
RBC: 4.12 MIL/uL (ref 3.80–5.20)
RDW: 15.2 % — ABNORMAL HIGH (ref 11.5–14.5)
WBC: 4.7 10*3/uL (ref 3.6–11.0)

## 2017-08-08 LAB — COMPREHENSIVE METABOLIC PANEL
ALT: 11 U/L — AB (ref 14–54)
AST: 23 U/L (ref 15–41)
Albumin: 3.5 g/dL (ref 3.5–5.0)
Alkaline Phosphatase: 87 U/L (ref 38–126)
Anion gap: 8 (ref 5–15)
BUN: 21 mg/dL — AB (ref 6–20)
CHLORIDE: 104 mmol/L (ref 101–111)
CO2: 28 mmol/L (ref 22–32)
CREATININE: 1.11 mg/dL — AB (ref 0.44–1.00)
Calcium: 9.5 mg/dL (ref 8.9–10.3)
GFR calc Af Amer: 58 mL/min — ABNORMAL LOW (ref >60)
GFR calc non Af Amer: 50 mL/min — ABNORMAL LOW (ref >60)
Glucose, Bld: 93 mg/dL (ref 65–99)
Potassium: 3.6 mmol/L (ref 3.5–5.1)
SODIUM: 140 mmol/L (ref 135–145)
Total Bilirubin: 1 mg/dL (ref 0.3–1.2)
Total Protein: 6.8 g/dL (ref 6.5–8.1)

## 2017-08-08 LAB — PROTIME-INR
INR: 1.9
PROTHROMBIN TIME: 21.6 s — AB (ref 11.4–15.2)

## 2017-08-08 MED ORDER — LEFLUNOMIDE 20 MG PO TABS
20.0000 mg | ORAL_TABLET | Freq: Every day | ORAL | Status: DC
  Administered 2017-08-10 – 2017-08-11 (×2): 20 mg via ORAL
  Filled 2017-08-08 (×4): qty 1

## 2017-08-08 MED ORDER — DIPHENHYDRAMINE HCL 25 MG PO CAPS: 25.0000 mg | ORAL_CAPSULE | Freq: Every day | ORAL | Status: DC | PRN

## 2017-08-08 MED ORDER — ATORVASTATIN CALCIUM 20 MG PO TABS
20.0000 mg | ORAL_TABLET | Freq: Every evening | ORAL | Status: DC
  Administered 2017-08-08 – 2017-08-11 (×4): 20 mg via ORAL
  Filled 2017-08-08 (×4): qty 1

## 2017-08-08 MED ORDER — PREGABALIN 75 MG PO CAPS
150.0000 mg | ORAL_CAPSULE | Freq: Three times a day (TID) | ORAL | Status: DC
  Administered 2017-08-08 – 2017-08-11 (×9): 150 mg via ORAL
  Filled 2017-08-08 (×9): qty 2

## 2017-08-08 MED ORDER — ENOXAPARIN SODIUM 40 MG/0.4ML ~~LOC~~ SOLN: 40.0000 mg | SUBCUTANEOUS | Status: DC

## 2017-08-08 MED ORDER — METOPROLOL SUCCINATE ER 50 MG PO TB24
50.0000 mg | ORAL_TABLET | Freq: Every evening | ORAL | Status: DC
  Administered 2017-08-10: 50 mg via ORAL
  Filled 2017-08-08 (×4): qty 1

## 2017-08-08 MED ORDER — TERAZOSIN HCL 5 MG PO CAPS
10.0000 mg | ORAL_CAPSULE | Freq: Every day | ORAL | Status: DC
  Administered 2017-08-08 – 2017-08-10 (×3): 10 mg via ORAL
  Filled 2017-08-08 (×4): qty 2

## 2017-08-08 MED ORDER — FOLIC ACID 1 MG PO TABS
1.0000 mg | ORAL_TABLET | Freq: Every day | ORAL | Status: DC
  Administered 2017-08-10 – 2017-08-11 (×2): 1 mg via ORAL
  Filled 2017-08-08 (×2): qty 1

## 2017-08-08 MED ORDER — MORPHINE SULFATE (PF) 2 MG/ML IV SOLN
2.0000 mg | INTRAVENOUS | Status: DC | PRN
Start: 2017-08-08 — End: 2017-08-11

## 2017-08-08 MED ORDER — ACETAMINOPHEN 650 MG RE SUPP
650.0000 mg | Freq: Four times a day (QID) | RECTAL | Status: DC | PRN
Start: 2017-08-08 — End: 2017-08-11

## 2017-08-08 MED ORDER — ADULT MULTIVITAMIN W/MINERALS CH
1.0000 | ORAL_TABLET | Freq: Every day | ORAL | Status: DC
  Administered 2017-08-10 – 2017-08-11 (×2): 1 via ORAL
  Filled 2017-08-08 (×2): qty 1

## 2017-08-08 MED ORDER — FEBUXOSTAT 40 MG PO TABS
40.0000 mg | ORAL_TABLET | Freq: Every day | ORAL | Status: DC
  Administered 2017-08-10 – 2017-08-11 (×2): 40 mg via ORAL
  Filled 2017-08-08 (×4): qty 1

## 2017-08-08 MED ORDER — MONTELUKAST SODIUM 10 MG PO TABS
10.0000 mg | ORAL_TABLET | Freq: Every day | ORAL | Status: DC
  Administered 2017-08-08 – 2017-08-10 (×3): 10 mg via ORAL
  Filled 2017-08-08 (×3): qty 1

## 2017-08-08 MED ORDER — SALINE SPRAY 0.65 % NA SOLN
1.0000 | NASAL | Status: DC | PRN
  Filled 2017-08-08: qty 44

## 2017-08-08 MED ORDER — HYDROXYCHLOROQUINE SULFATE 200 MG PO TABS
200.0000 mg | ORAL_TABLET | Freq: Two times a day (BID) | ORAL | Status: DC
  Administered 2017-08-08 – 2017-08-11 (×5): 200 mg via ORAL
  Filled 2017-08-08 (×5): qty 1

## 2017-08-08 MED ORDER — POLYETHYLENE GLYCOL 3350 17 G PO PACK: 17.0000 g | PACK | Freq: Every day | ORAL | Status: DC | PRN

## 2017-08-08 MED ORDER — ACETAMINOPHEN 325 MG PO TABS: 650.0000 mg | ORAL_TABLET | Freq: Four times a day (QID) | ORAL | Status: DC | PRN

## 2017-08-08 MED ORDER — TORSEMIDE 20 MG PO TABS
20.0000 mg | ORAL_TABLET | Freq: Every day | ORAL | Status: DC
  Administered 2017-08-11: 20 mg via ORAL
  Filled 2017-08-08 (×2): qty 1

## 2017-08-08 MED ORDER — ONDANSETRON HCL 4 MG PO TABS: 4.0000 mg | ORAL_TABLET | Freq: Four times a day (QID) | ORAL | Status: DC | PRN

## 2017-08-08 MED ORDER — POTASSIUM CHLORIDE CRYS ER 10 MEQ PO TBCR
10.0000 meq | EXTENDED_RELEASE_TABLET | Freq: Every day | ORAL | Status: DC
  Administered 2017-08-10 – 2017-08-11 (×2): 10 meq via ORAL
  Filled 2017-08-08 (×2): qty 1

## 2017-08-08 MED ORDER — AMLODIPINE BESYLATE 5 MG PO TABS
5.0000 mg | ORAL_TABLET | Freq: Every day | ORAL | Status: DC
  Administered 2017-08-11: 5 mg via ORAL
  Filled 2017-08-08 (×2): qty 1

## 2017-08-08 MED ORDER — OXYCODONE HCL 5 MG PO TABS
10.0000 mg | ORAL_TABLET | ORAL | Status: DC | PRN
  Administered 2017-08-08 – 2017-08-11 (×7): 10 mg via ORAL
  Filled 2017-08-08 (×7): qty 2

## 2017-08-08 MED ORDER — ENOXAPARIN SODIUM 40 MG/0.4ML ~~LOC~~ SOLN
40.0000 mg | Freq: Two times a day (BID) | SUBCUTANEOUS | Status: DC
  Administered 2017-08-09 – 2017-08-10 (×2): 40 mg via SUBCUTANEOUS
  Filled 2017-08-08 (×3): qty 0.4

## 2017-08-08 MED ORDER — ONDANSETRON HCL 4 MG/2ML IJ SOLN
4.0000 mg | Freq: Four times a day (QID) | INTRAMUSCULAR | Status: DC | PRN
  Administered 2017-08-09 – 2017-08-11 (×2): 4 mg via INTRAVENOUS
  Filled 2017-08-08 (×2): qty 2

## 2017-08-08 MED ORDER — DOCUSATE SODIUM 100 MG PO CAPS
100.0000 mg | ORAL_CAPSULE | Freq: Two times a day (BID) | ORAL | Status: DC
  Administered 2017-08-09 – 2017-08-11 (×4): 100 mg via ORAL
  Filled 2017-08-08 (×5): qty 1

## 2017-08-08 NOTE — Progress Notes (Signed)
Chaplain responded to and OR for an AD. Chaplain remembered Pt for another visit and providing education. Chaplain offered education and left for completion by daughter. Pt was have nurse submit order when ready.   08/08/17 1300  Clinical Encounter Type  Visited With Patient;Family  Visit Type Spiritual support  Referral From Nurse  Spiritual Encounters  Spiritual Needs Brochure

## 2017-08-08 NOTE — H&P (Signed)
[place] [facility] [place] NAME: Tina Pacheco    MR#:  [id number]  DATE OF BIRTH:  02-17-1951  DATE OF ADMISSION:  08/08/2017  PRIMARY CARE PHYSICIAN: Venia Carbon, MD   REQUESTING/REFERRING PHYSICIAN: Dr. Blima Rich  CHIEF COMPLAINT:  No chief complaint on file.   HISTORY OF PRESENT ILLNESS:  Tina Pacheco  is a 67 y.o. female with a known history of rheumatoid arthritis, osteoarthritis, atrial fibrillation on Pradaxa, COPD, diastolic CHF, hypertension, chronic nonhealing right foot fracture status post an extensive surgery on the foot about 3 to 4 weeks ago was sent in from podiatry office today secondary to wound dehiscence and possible infection. Patient had dealt with her right foot wound for almost 2 years now.  She was using a Rollator before.  Had extensive surgery on the foot about a month ago.  Was discharged to rehab in a hard cast and nonweightbearing to the leg.  Her follow-up visit after the surgery showed improvement in wound healing.  However today when she went for follow-up visit, there has been noted wound  dehiscence which is significant and also likely from oozing blood and hematoma in the hard cast.  Podiatry recommended admission for IV antibiotics and possible debridement and wound VAC placement.  Patient denies any complaints.  Has had significant pain in the leg.  But also has chronic pains from her arthritis.  No fevers or chills, no nausea or vomiting.  PAST MEDICAL HISTORY:   Past Medical History:  Diagnosis Date  . Arthritis    RA  . Asthma   . Basal cell carcinoma   . Cataract 2018   bilateral eyes; corrected with surgery  . Claustrophobia   . COPD (chronic obstructive pulmonary disease) ([place])   . Diastolic dysfunction    a. echo 07/2014: EF 55-60%, no RWMA, GR2DD, mild MR, LA moderately dilated, PASP 38 mm Hg  . Dyslipidemia   . Dysrhythmia   . Headache    migraines  . Hemihypertrophy   . History of  cardiac cath    a. 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 History of gout   . Hyperplastic colonic polyp 2003  . Hypertension   . Hypokalemia   . Morbid obesity ([place])   . PAF (paroxysmal atrial fibrillation) ([facility])    a. on Pradaxa; b. CHADSVASc at least 2 (HTN & female)  . Rheumatoid arthritis(714.0)   . Sleep apnea    a. not compliant with CPAP    PAST SURGICAL HISTORY:   Past Surgical History:  Procedure Laterality Date  . ABDOMINAL HYSTERECTOMY    . CARDIAC CATHETERIZATION  05/24/2010   nonobstructive CAD  . CATARACT EXTRACTION W/ INTRAOCULAR LENS IMPLANT Right 06/12/2016   Dr. Darleen Crocker  . CATARACT EXTRACTION W/ INTRAOCULAR LENS IMPLANT Left 06/26/2016   Dr. Darleen Crocker  . CESAREAN SECTION    . CHOLECYSTECTOMY    . COLONOSCOPY  06/12/2011   Procedure: COLONOSCOPY;  Surgeon: Juanita Craver, MD;  Location: [facility];  Service: Endoscopy;  Laterality: N/A;  . COLONOSCOPY N/A 03/17/2013   Procedure: COLONOSCOPY;  Surgeon: Juanita Craver, MD;  Location: [facility];  Service: Endoscopy;  Laterality: N/A;  . EYE SURGERY    . FOOT ARTHRODESIS Right 07/13/2017   Procedure: ARTHRODESIS FOOT-MULTI.FUSIONS (6 JOINTS);  Surgeon: Albertine Patricia, DPM;  Location: [facility];  Service: Podiatry;  Laterality: Right;  . KNEE ARTHROSCOPY     bilateral  . SINUSOTOMY    .  TOOTH EXTRACTION  12/2016  . VAGINAL HYSTERECTOMY      SOCIAL HISTORY:   Social History   Tobacco Use  . Smoking status: Never Smoker  . Smokeless tobacco: Never Used  Substance Use Topics  . Alcohol use: No    FAMILY HISTORY:   Family History  Problem Relation Age of Onset  . Emphysema Father        smoked  . Tuberculosis Mother   . Parkinsonism Mother   . Diabetes type II Sister   . Breast cancer Sister   . Breast cancer Maternal Aunt   . Tuberculosis Sister     DRUG ALLERGIES:  No Known Allergies  REVIEW OF SYSTEMS:   Review of Systems  Constitutional: Negative for chills, fever,  malaise/fatigue and weight loss.  HENT: Negative for ear discharge, ear pain, hearing loss, nosebleeds and tinnitus.   Eyes: Negative for blurred vision, double vision and photophobia.  Respiratory: Negative for cough, hemoptysis, shortness of breath and wheezing.   Cardiovascular: Positive for leg swelling. Negative for chest pain, palpitations and orthopnea.  Gastrointestinal: Negative for abdominal pain, constipation, diarrhea, heartburn, melena, nausea and vomiting.  Genitourinary: Negative for dysuria, frequency, hematuria and urgency.  Musculoskeletal: Positive for joint pain and myalgias. Negative for back pain and neck pain.  Skin: Negative for rash.  Neurological: Negative for dizziness, tingling, tremors, sensory change, speech change, focal weakness and headaches.  Endo/Heme/Allergies: Does not bruise/bleed easily.  Psychiatric/Behavioral: Negative for depression.    MEDICATIONS AT HOME:   Prior to Admission medications   Medication Sig Start Date End Date Taking? Authorizing Provider  amLODipine (NORVASC) 5 MG tablet TAKE 1 TABLET BY MOUTH ONCE DAILY Patient taking differently: Take 5 mgs by mouth once daily in the evening 05/28/17   Venia Carbon, MD  atorvastatin (LIPITOR) 20 MG tablet Take 1 tablet (20 mg total) by mouth daily at 6 PM. Patient taking differently: Take 20 mg by mouth every evening.  05/16/17   Venia Carbon, MD  diphenhydrAMINE (BENADRYL) 25 MG tablet Take 25 mg by mouth daily as needed for allergies.    [provider]  docusate sodium (COLACE) 100 MG capsule Take 1 capsule (100 mg total) by mouth 2 (two) times daily. 07/16/17   Henreitta Leber, MD  febuxostat (ULORIC) 40 MG tablet Take 40 mg by mouth daily.    [provider]  folic acid (FOLVITE) 1 MG tablet Take 1 mg by mouth daily.    [provider]  hydroxychloroquine (PLAQUENIL) 200 MG tablet Take 200 mg by mouth 2 (two) times daily.     [provider]    ibuprofen (ADVIL,MOTRIN) 200 MG tablet Take 400-600 mg by mouth 2 (two) times daily as needed.    [provider]  KLOR-CON M20 20 MEQ tablet TAKE 1/2 TABLET (=10MEQ)   TWO TIMES A DAY ; MAY TAKE AND EXTRA TABLET WHEN      TAKING TORSEMIDE 01/29/17   Minna Merritts, MD  leflunomide (ARAVA) 20 MG tablet Take 20 mg by mouth every morning.  09/20/11   [provider]  metoprolol succinate (TOPROL-XL) 50 MG 24 hr tablet Take 1 tablet (50 mg total) by mouth daily. Take with or immediately following a meal. Patient taking differently: Take 50 mg by mouth every evening. Take with or immediately following a meal. 11/16/16   Lucille Passy, MD  montelukast (SINGULAIR) 10 MG tablet Take 1 tablet (10 mg total) by mouth at bedtime. 11/16/16  Lucille Passy, MD  Multiple Vitamin (MULTIVITAMIN) tablet Take 1 tablet by mouth daily.     [provider]  Oxycodone HCl 10 MG TABS Take 1 tablet (10 mg total) by mouth every 4 (four) hours as needed for moderate pain. Take 2 tablets by mouth every 12 hours 07/16/17   Henreitta Leber, MD  polyethylene glycol (MIRALAX / GLYCOLAX) packet Take 17 g by mouth daily as needed for mild constipation. 07/16/17   Henreitta Leber, MD  PRADAXA 150 MG CAPS capsule TAKE 1 CAPSULE TWICE DAILY 05/28/17   Minna Merritts, MD  pregabalin (LYRICA) 150 MG capsule Take 150 mg by mouth 3 (three) times daily.    [provider]  SF 5000 PLUS 1.1 % CREA dental cream Place 1 application onto teeth 2 (two) times daily. 06/01/17   [provider]  sodium chloride (OCEAN) 0.65 % SOLN nasal spray Place 1 spray into both nostrils as needed for congestion.    [provider]  terazosin (HYTRIN) 10 MG capsule TAKE 1 CAPSULE BY MOUTH AT BEDTIME 05/16/17   Viviana Simpler I, MD  tolterodine (DETROL LA) 2 MG 24 hr capsule Take 1 capsule (2 mg total) by mouth daily. Patient taking differently: Take 2 mg by mouth daily as needed (bladder spasms).  02/02/16    Lucille Passy, MD  torsemide (DEMADEX) 20 MG tablet take 1 tablet by mouth twice a day for edema Patient taking differently: Take 20 mg by mouth twice daily as needed for swelling 07/28/16   Lucille Passy, MD      VITAL SIGNS:  Blood pressure 124/74, pulse 62, temperature 97.7 F (36.5 C), temperature source Oral, resp. rate 18, SpO2 95 %.  PHYSICAL EXAMINATION:   Physical Exam  GENERAL:  67 y.o.-year-old patient lying in the bed with no acute distress.  EYES: Pupils equal, round, reactive to light and accommodation. No scleral icterus. Extraocular muscles intact.  HEENT: Head atraumatic, normocephalic. Oropharynx and nasopharynx clear.  NECK:  Supple, no jugular venous distention. No thyroid enlargement, no tenderness.  LUNGS: Normal breath sounds bilaterally, no wheezing, rales,rhonchi or crepitation. No use of accessory muscles of respiration.  Decreased bibasilar breath sounds. CARDIOVASCULAR: S1, S2 normal. No murmurs, rubs, or gallops.  ABDOMEN: Soft, nontender, nondistended. Bowel sounds present. No organomegaly or mass.  EXTREMITIES: No pedal edema, cyanosis, or clubbing. Right leg wrapped in soft cast and ACE bandage NEUROLOGIC: Cranial nerves II through XII are intact. Muscle strength 5/5 in all extremities. Sensation intact. Gait not checked.  PSYCHIATRIC: The patient is alert and oriented x 3.  SKIN: No obvious rash, lesion, or ulcer.   LABORATORY PANEL:   CBC Recent Labs  Lab 08/08/17 1216  WBC 4.7  HGB 11.9*  HCT 35.8  PLT 173   ------------------------------------------------------------------------------------------------------------------  Chemistries  Recent Labs  Lab 08/08/17 1216  NA 140  K 3.6  CL 104  CO2 28  GLUCOSE 93  BUN 21*  CREATININE 1.11*  CALCIUM 9.5  AST 23  ALT 11*  ALKPHOS 87  BILITOT 1.0   ------------------------------------------------------------------------------------------------------------------  Cardiac Enzymes No  results for input(s): TROPONINI in the last 168 hours. ------------------------------------------------------------------------------------------------------------------  RADIOLOGY:  No results found.  EKG:   Orders placed or performed in visit on 07/06/17  . EKG 12-Lead    IMPRESSION AND PLAN:   Tina Pacheco  is a 67 y.o. female with a known history of rheumatoid arthritis, osteoarthritis, atrial fibrillation on Pradaxa, COPD, diastolic CHF, hypertension, chronic  nonhealing right foot fracture status post an extensive surgery on the foot about 3 to 4 weeks ago was sent in from podiatry office today secondary to wound dehiscence and possible infection.  1.  Right foot infection from wound dehiscence-podiatry consult -Broad-spectrum antibiotics with vancomycin and Zosyn -Possible wound debridement and wound VAC placement tomorrow -Nonweightbearing at this time.  2.  Chronic pain from arthritis-continue home pain medications  3.  Rheumatoid arthritis-on leflunomide and Plaquenil.  4.  Atrial fibrillation-rate controlled.  Currently in sinus rhythm.  Continue Toprol.  Hold Pradaxa for procedure tomorrow  5.  Diastolic CHF-continue torsemide.  Well compensated at this time.  6.  DVT prophylaxis-Pradaxa is on hold.  If INR is low, can use Lovenox for bridging.  Social worker consult as patient is from nursing home   All the records are reviewed and case discussed with ED provider. Management plans discussed with the patient, family and they are in agreement.  CODE STATUS: Full code  TOTAL TIME TAKING CARE OF THIS PATIENT: 50 minutes.    Gladstone Lighter M.D on 08/08/2017 at 3:22 PM  Between 7am to 6pm - Pager - [phone]  After 6pm go to [email or web address] - password [email or web address] [facility]  Office  [phone]  CC: Primary care physician; Venia Carbon, MD

## 2017-08-08 NOTE — Consult Note (Signed)
[facility]                                                      Patient Demographics  Tina Pacheco, is a 67 y.o. female   MRN: [id number]   DOB - Jan 06, 1951  Admit Date - 08/08/2017    Outpatient Primary MD for the patient is Tina Carbon, MD  Consult requested in the Hospital by Gladstone Lighter, MD, On 08/08/2017    Reason for consult:wound dehisce right foot   With History of -  Past Medical History:  Diagnosis Date  . Arthritis    RA  . Asthma   . Basal cell carcinoma   . Cataract 2018   bilateral eyes; corrected with surgery  . Claustrophobia   . COPD (chronic obstructive pulmonary disease) ([place])   . Diastolic dysfunction    a. echo 07/2014: EF 55-60%, no RWMA, GR2DD, mild MR, LA moderately dilated, PASP 38 mm Hg  . Dyslipidemia   . Dysrhythmia   . Headache    migraines  . Hemihypertrophy   . History of cardiac cath    a. 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 History of gout   . Hyperplastic colonic polyp 2003  . Hypertension   . Hypokalemia   . Morbid obesity ([place])   . PAF (paroxysmal atrial fibrillation) (HCC)    a. on Pradaxa; b. CHADSVASc at least 2 (HTN & female)  . Rheumatoid arthritis(714.0)   . Sleep apnea    a. not compliant with CPAP      Past Surgical History:  Procedure Laterality Date  . ABDOMINAL HYSTERECTOMY    . CARDIAC CATHETERIZATION  05/24/2010   nonobstructive CAD  . CATARACT EXTRACTION W/ INTRAOCULAR LENS IMPLANT Right 06/12/2016   Dr. Darleen Crocker  . CATARACT EXTRACTION W/ INTRAOCULAR LENS IMPLANT Left 06/26/2016   Dr. Darleen Crocker  . CESAREAN SECTION    . CHOLECYSTECTOMY    . COLONOSCOPY  06/12/2011   Procedure: COLONOSCOPY;  Surgeon: Juanita Craver, MD;  Location: [facility];  Service: Endoscopy;  Laterality: N/A;  . COLONOSCOPY N/A 03/17/2013   Procedure: COLONOSCOPY;  Surgeon:  Juanita Craver, MD;  Location: [facility];  Service: Endoscopy;  Laterality: N/A;  . EYE SURGERY    . FOOT ARTHRODESIS Right 07/13/2017   Procedure: ARTHRODESIS FOOT-MULTI.FUSIONS (6 JOINTS);  Surgeon: Albertine Patricia, DPM;  Location: [facility] ORS;  Service: Podiatry;  Laterality: Right;  . KNEE ARTHROSCOPY     bilateral  . SINUSOTOMY    . TOOTH EXTRACTION  12/2016  . VAGINAL HYSTERECTOMY      in for   No chief complaint on file.    HPI  Tina Pacheco  is a 67 y.o. female, the patient underwent extensive surgery on the right foot about 3 and at 4 weeks ago. She included fusion of the talonavicular joint and navicular cuneiform joints and tarsometatarsal joints. She initially progressed uneventfully and her last office with.signs of significant improvement in wound healing. She came in today for cast change and had significant dehiscence across 2 incision margins.x-rays last office visit showed stable and intact hardware also. Good alignment and position of the bones that were fused.she's been nonweightbearing and then in a rehabilitation center since surgery.    Review of Systems    In addition to the HPI above,  No Fever-chills, No Headache, No changes with Vision or hearing, No problems swallowing food or Liquids, No Chest pain, Cough or Shortness of Breath, No Abdominal pain, No Nausea or Vommitting, Bowel movements are regular, No Blood in stool or Urine, No dysuria, No new skin rashes or bruises, No new joints pains-aches,  No new weakness, tingling, numbness in any extremity, No recent weight gain or loss, No polyuria, polydypsia or polyphagia, No significant Mental Stressors.  A full 10 point Review of Systems was done, except as stated above, all other Review of Systems were negative.   Social History Social History   Tobacco Use  . Smoking status: Never Smoker  . Smokeless tobacco: Never Used  Substance Use Topics  . Alcohol use: No    Family History Family  History  Problem Relation Age of Onset  . Emphysema Father        smoked  . Tuberculosis Mother   . Parkinsonism Mother   . Diabetes type II Sister   . Breast cancer Sister   . Breast cancer Maternal Aunt   . Tuberculosis Sister     Prior to Admission medications   Medication Sig Start Date End Date Taking? Authorizing Provider  amLODipine (NORVASC) 5 MG tablet TAKE 1 TABLET BY MOUTH ONCE DAILY Patient taking differently: Take 5 mgs by mouth once daily in the evening 05/28/17   Tina Carbon, MD  atorvastatin (LIPITOR) 20 MG tablet Take 1 tablet (20 mg total) by mouth daily at 6 PM. Patient taking differently: Take 20 mg by mouth every evening.  05/16/17   Tina Carbon, MD  diphenhydrAMINE (BENADRYL) 25 MG tablet Take 25 mg by mouth daily as needed for allergies.    [provider]  docusate sodium (COLACE) 100 MG capsule Take 1 capsule (100 mg total) by mouth 2 (two) times daily. 07/16/17   Henreitta Leber, MD  febuxostat (ULORIC) 40 MG tablet Take 40 mg by mouth daily.    [provider]  folic acid (FOLVITE) 1 MG tablet Take 1 mg by mouth daily.    [provider]  hydroxychloroquine (PLAQUENIL) 200 MG tablet Take 200 mg by mouth 2 (two) times daily.     [provider]  ibuprofen (ADVIL,MOTRIN) 200 MG tablet Take 400-600 mg by mouth 2 (two) times daily as needed.    [provider]  KLOR-CON M20 20 MEQ tablet TAKE 1/2 TABLET (=10MEQ)   TWO TIMES A DAY ; MAY TAKE AND EXTRA TABLET WHEN      TAKING TORSEMIDE 01/29/17   Minna Merritts, MD  leflunomide (ARAVA) 20 MG tablet Take 20 mg by mouth every morning.  09/20/11   [provider]  metoprolol succinate (TOPROL-XL) 50 MG 24 hr tablet Take 1 tablet (50 mg total) by mouth daily. Take with or immediately following a meal. Patient taking differently: Take 50 mg by mouth every evening. Take with or immediately following a meal. 11/16/16   Lucille Passy, MD  montelukast (SINGULAIR)  10 MG tablet Take 1 tablet (10 mg total) by mouth at bedtime. 11/16/16   Lucille Passy, MD  Multiple Vitamin (MULTIVITAMIN) tablet Take 1 tablet by mouth daily.     [provider]  Oxycodone HCl 10 MG TABS Take 1 tablet (10 mg total) by mouth every 4 (four) hours as needed for moderate pain. Take 2 tablets by mouth every 12 hours 07/16/17   Henreitta Leber, MD  polyethylene glycol (MIRALAX / Floria Raveling) packet  Take 17 g by mouth daily as needed for mild constipation. 07/16/17   Henreitta Leber, MD  PRADAXA 150 MG CAPS capsule TAKE 1 CAPSULE TWICE DAILY 05/28/17   Minna Merritts, MD  pregabalin (LYRICA) 150 MG capsule Take 150 mg by mouth 3 (three) times daily.    [provider]  SF 5000 PLUS 1.1 % CREA dental cream Place 1 application onto teeth 2 (two) times daily. 06/01/17   [provider]  sodium chloride (OCEAN) 0.65 % SOLN nasal spray Place 1 spray into both nostrils as needed for congestion.    [provider]  terazosin (HYTRIN) 10 MG capsule TAKE 1 CAPSULE BY MOUTH AT BEDTIME 05/16/17   Viviana Simpler I, MD  tolterodine (DETROL LA) 2 MG 24 hr capsule Take 1 capsule (2 mg total) by mouth daily. Patient taking differently: Take 2 mg by mouth daily as needed (bladder spasms).  02/02/16   Lucille Passy, MD  torsemide (DEMADEX) 20 MG tablet take 1 tablet by mouth twice a day for edema Patient taking differently: Take 20 mg by mouth twice daily as needed for swelling 07/28/16   Lucille Passy, MD    Anti-infectives (From admission, onward)   Start     Dose/Rate Route Frequency Ordered Stop   08/08/17 1200  hydroxychloroquine (PLAQUENIL) tablet 200 mg     200 mg Oral 2 times daily 08/08/17 1132        Scheduled Meds: . amLODipine  5 mg Oral Daily  . atorvastatin  20 mg Oral QPM  . docusate sodium  100 mg Oral BID  . enoxaparin (LOVENOX) injection  40 mg Subcutaneous Q24H  . febuxostat  40 mg Oral Daily  . folic acid  1 mg Oral Daily  . hydroxychloroquine   200 mg Oral BID  . leflunomide  20 mg Oral Daily  . metoprolol succinate  50 mg Oral QPM  . montelukast  10 mg Oral QHS  . multivitamin with minerals  1 tablet Oral Daily  . potassium chloride SA  10 mEq Oral Daily  . pregabalin  150 mg Oral TID  . terazosin  10 mg Oral QHS  . torsemide  20 mg Oral Daily   Continuous Infusions: PRN Meds:.acetaminophen **OR** acetaminophen, diphenhydrAMINE, morphine injection, ondansetron **OR** ondansetron (ZOFRAN) IV, oxyCODONE, polyethylene glycol, sodium chloride  No Known Allergies  Physical Exam  Vitals  Blood pressure 124/74, pulse 62, temperature 97.7 F (36.5 C), temperature source Oral, resp. rate 18, SpO2 95 %.  Lower Extremity exam:  Vascular:vascular status is intact in both DP and PT arteries  Dermatological:wound dehiscence to the distal third of the medial incision and the distal one half of teral incision. I debrided the some in the office today with a 15 scalpel blade to excise some of that dead material and she showed good bleeding to the regions but there is still some necrotic tissue that needs to be cleaned up. Some mild erythema around the region of both wounds. No frank pus was noted at the time. Evidence of hematoma was an area.  Neurological:some peripheral neuropathy but still has some feeling in the foot as well.  Ortho:status post fusion  Navicular cuneiform medial intermediate and second and third metatarsocuneiform joints. Previous x-ray showed stable fixation and position.  Data Review  CBC No results for input(s): WBC, HGB, HCT, PLT, MCV, MCH, MCHC, RDW, LYMPHSABS, MONOABS, EOSABS, BASOSABS, BANDABS in the last 168 hours.  Invalid input(s): NEUTRABS, BANDSABD ------------------------------------------------------------------------------------------------------------------  Chemistries  No results for input(s): NA, K, CL, CO2, GLUCOSE, BUN, CREATININE, CALCIUM, MG, AST, ALT, ALKPHOS, BILITOT in the last 168  hours.  Invalid input(s): GFRCGP ------------------------------------------------------------------------------------------------------------------ CrCl cannot be calculated (Patient's most recent lab result is older than the maximum 21 days allowed.). ------------------------------------------------------------------------------------------------------------------ No results for input(s): TSH, T4TOTAL, T3FREE, THYROIDAB in the last 72 hours.  Invalid input(s): FREET3 Urinalysis    Component Value Date/Time   COLORURINE STRAW (A) 07/15/2017 1739   APPEARANCEUR CLOUDY (A) 07/15/2017 1739   APPEARANCEUR Cloudy 03/28/2014 1033   LABSPEC 1.005 07/15/2017 1739   LABSPEC 1.018 03/28/2014 1033   PHURINE 7.0 07/15/2017 [place] 07/15/2017 1739   GLUCOSEU Negative 03/28/2014 1033   HGBUR NEGATIVE 07/15/2017 1739   BILIRUBINUR NEGATIVE 07/15/2017 1739   BILIRUBINUR Negative 03/28/2014 [place] 07/15/2017 1739   PROTEINUR NEGATIVE 07/15/2017 1739   NITRITE NEGATIVE 07/15/2017 1739   LEUKOCYTESUR MODERATE (A) 07/15/2017 1739   LEUKOCYTESUR 3+ 03/28/2014 1033     Imaging results:   No results found.  Assessment & Plan: Basically as was needed to be cleaned up more I'll utilize a versa jet on her tomorrow and start a wound VAC and then we should [place] her back to rehabilitation at some point in order to continue to heal. She'll still need to stay nonweightbearing this also. I got her scheduled for tomorrow morning around 11:00 for surgical debridement and wound VAC placement.I explained to the patient and her husband bout why we had to do this and likey culprit .she has been on anticoagulants to combat atrial fibrillation as well as potential blood cloti withher reduced activity and nonweightbearing status. I think she ended up with gradual seeping and bleeding the collected underneath the incision margins and let to the dehiscence.  Active Problems:    Cellulitis   Family Communication: Plan discussed with patient.  Albertine Patricia M.D on 08/08/2017 at 12:26 PM  Thank you for the consult, we will follow the patient with you in the Hospital.

## 2017-08-08 NOTE — Progress Notes (Signed)
Pharmacy Lovenox Dosing  67 y.o. female admitted with cellulitis.   Patient ordered Lovenox 40 mg daily for VTE prophylaxis.   There were no vitals filed for this visit.  There is no height or weight on file to calculate BMI.  Weight = 128 kg Height= 65 inches SCr= 1.11 CrCl (Adjusted body weight)= 66 ml/min  CrCl cannot be calculated (Unknown ideal weight.).  Will adjust Lovenox dosing to 40 mg bid with weight > 100 kg and BMI > 40.  Ulice Dash D 08/08/2017 2:02 PM

## 2017-08-09 ENCOUNTER — Encounter
Admission: AD | Disposition: A | Discharge status: Skilled Nursing Facility | Payer: Self-pay | Source: Ambulatory Visit | Attending: Internal Medicine

## 2017-08-09 ENCOUNTER — Encounter: Payer: Self-pay | Admitting: Anesthesiology

## 2017-08-09 ENCOUNTER — Inpatient Hospital Stay: Payer: Medicare Other | Admitting: Anesthesiology

## 2017-08-09 ENCOUNTER — Inpatient Hospital Stay: Admission: RE | Admit: 2017-08-09 | Payer: Medicare Other | Source: Ambulatory Visit | Admitting: Podiatry

## 2017-08-09 HISTORY — PX: IRRIGATION AND DEBRIDEMENT FOOT: SHX6602

## 2017-08-09 HISTORY — PX: APPLICATION OF WOUND VAC: SHX5189

## 2017-08-09 LAB — BASIC METABOLIC PANEL
Anion gap: 6 (ref 5–15)
BUN: 23 mg/dL — ABNORMAL HIGH (ref 6–20)
CALCIUM: 9.9 mg/dL (ref 8.9–10.3)
CO2: 28 mmol/L (ref 22–32)
CREATININE: 0.97 mg/dL (ref 0.44–1.00)
Chloride: 105 mmol/L (ref 101–111)
GFR calc non Af Amer: 59 mL/min — ABNORMAL LOW (ref >60)
Glucose, Bld: 96 mg/dL (ref 65–99)
Potassium: 3.5 mmol/L (ref 3.5–5.1)
SODIUM: 139 mmol/L (ref 135–145)

## 2017-08-09 LAB — CBC
HCT: 33.4 % — ABNORMAL LOW (ref 35.0–47.0)
Hemoglobin: 11.1 g/dL — ABNORMAL LOW (ref 12.0–16.0)
MCH: 28.7 pg (ref 26.0–34.0)
MCHC: 33.2 g/dL (ref 32.0–36.0)
MCV: 86.4 fL (ref 80.0–100.0)
PLATELETS: 161 10*3/uL (ref 150–440)
RBC: 3.87 MIL/uL (ref 3.80–5.20)
RDW: 14.9 % — ABNORMAL HIGH (ref 11.5–14.5)
WBC: 4.5 10*3/uL (ref 3.6–11.0)

## 2017-08-09 LAB — SURGICAL PCR SCREEN
MRSA, PCR: NEGATIVE
STAPHYLOCOCCUS AUREUS: POSITIVE — AB

## 2017-08-09 LAB — HIV ANTIBODY (ROUTINE TESTING W REFLEX): HIV SCREEN 4TH GENERATION: NONREACTIVE

## 2017-08-09 SURGERY — IRRIGATION AND DEBRIDEMENT FOOT
Anesthesia: General | Laterality: Right

## 2017-08-09 MED ORDER — FESOTERODINE FUMARATE ER 4 MG PO TB24
4.0000 mg | ORAL_TABLET | Freq: Every day | ORAL | Status: DC
  Administered 2017-08-09 – 2017-08-11 (×3): 4 mg via ORAL
  Filled 2017-08-09 (×4): qty 1

## 2017-08-09 MED ORDER — LIDOCAINE HCL (CARDIAC) PF 100 MG/5ML IV SOSY
PREFILLED_SYRINGE | INTRAVENOUS | Status: DC | PRN
  Administered 2017-08-09: 40 mg via INTRAVENOUS

## 2017-08-09 MED ORDER — NON FORMULARY: 2.0000 mg | Freq: Every day | Status: DC

## 2017-08-09 MED ORDER — BUPIVACAINE-EPINEPHRINE (PF) 0.5% -1:200000 IJ SOLN
INTRAMUSCULAR | Status: DC | PRN
  Administered 2017-08-09: 5 mL via PERINEURAL

## 2017-08-09 MED ORDER — LACTATED RINGERS IV SOLN
INTRAVENOUS | Status: DC | PRN
  Administered 2017-08-09: 11:00:00 via INTRAVENOUS

## 2017-08-09 MED ORDER — PIPERACILLIN-TAZOBACTAM 3.375 G IVPB
3.3750 g | Freq: Three times a day (TID) | INTRAVENOUS | Status: DC
  Administered 2017-08-09 – 2017-08-10 (×3): 3.375 g via INTRAVENOUS
  Filled 2017-08-09 (×3): qty 50

## 2017-08-09 MED ORDER — PIPERACILLIN-TAZOBACTAM 3.375 G IVPB 30 MIN
3.3750 g | Freq: Three times a day (TID) | INTRAVENOUS | Status: DC
  Administered 2017-08-09: 3.375 g via INTRAVENOUS

## 2017-08-09 MED ORDER — ONDANSETRON HCL 4 MG/2ML IJ SOLN
INTRAMUSCULAR | Status: DC | PRN
  Administered 2017-08-09: 4 mg via INTRAVENOUS

## 2017-08-09 MED ORDER — FENTANYL CITRATE (PF) 100 MCG/2ML IJ SOLN
25.0000 ug | INTRAMUSCULAR | Status: DC | PRN
  Administered 2017-08-09 (×2): 50 ug via INTRAVENOUS

## 2017-08-09 MED ORDER — MIDAZOLAM HCL 2 MG/2ML IJ SOLN
INTRAMUSCULAR | Status: AC
  Filled 2017-08-09: qty 2

## 2017-08-09 MED ORDER — ONDANSETRON HCL 4 MG/2ML IJ SOLN: 4.0000 mg | Freq: Once | INTRAMUSCULAR | Status: DC | PRN

## 2017-08-09 MED ORDER — BUPIVACAINE-EPINEPHRINE (PF) 0.5% -1:200000 IJ SOLN
INTRAMUSCULAR | Status: AC
  Filled 2017-08-09: qty 30

## 2017-08-09 MED ORDER — PROPOFOL 10 MG/ML IV BOLUS
INTRAVENOUS | Status: DC | PRN
  Administered 2017-08-09: 150 mg via INTRAVENOUS

## 2017-08-09 MED ORDER — FENTANYL CITRATE (PF) 100 MCG/2ML IJ SOLN
INTRAMUSCULAR | Status: AC
  Filled 2017-08-09: qty 2

## 2017-08-09 MED ORDER — SUCCINYLCHOLINE CHLORIDE 20 MG/ML IJ SOLN
INTRAMUSCULAR | Status: DC | PRN
  Administered 2017-08-09: 100 mg via INTRAVENOUS

## 2017-08-09 MED ORDER — MIDAZOLAM HCL 2 MG/2ML IJ SOLN
INTRAMUSCULAR | Status: DC | PRN
  Administered 2017-08-09: 2 mg via INTRAVENOUS

## 2017-08-09 MED ORDER — PIPERACILLIN-TAZOBACTAM 3.375 G IVPB
INTRAVENOUS | Status: AC
  Filled 2017-08-09: qty 50

## 2017-08-09 MED ORDER — PROPOFOL 500 MG/50ML IV EMUL
INTRAVENOUS | Status: AC
  Filled 2017-08-09: qty 50

## 2017-08-09 SURGICAL SUPPLY — 75 items
"PENCIL ELECTRO HAND CTR " (MISCELLANEOUS) ×1 IMPLANT
BAG COUNTER SPONGE EZ (MISCELLANEOUS) ×2 IMPLANT
BANDAGE ACE 4X5 VEL STRL LF (GAUZE/BANDAGES/DRESSINGS) ×3 IMPLANT
BANDAGE ELASTIC 3 LF NS (GAUZE/BANDAGES/DRESSINGS) ×3 IMPLANT
BANDAGE ELASTIC 4 LF NS (GAUZE/BANDAGES/DRESSINGS) ×3 IMPLANT
BLADE OSC/SAGITTAL MD 5.5X18 (BLADE) IMPLANT
BLADE OSCILLATING/SAGITTAL (BLADE)
BLADE SURG 15 STRL LF DISP TIS (BLADE) ×1 IMPLANT
BLADE SURG 15 STRL SS (BLADE) ×3
BLADE SW THK.38XMED LNG THN (BLADE) IMPLANT
BNDG COHESIVE 4X5 TAN STRL (GAUZE/BANDAGES/DRESSINGS) ×3 IMPLANT
BNDG COHESIVE 6X5 TAN STRL LF (GAUZE/BANDAGES/DRESSINGS) ×3 IMPLANT
BNDG CONFORM 3 STRL LF (GAUZE/BANDAGES/DRESSINGS) ×3 IMPLANT
BNDG ESMARK 4X12 TAN STRL LF (GAUZE/BANDAGES/DRESSINGS) ×3 IMPLANT
BNDG GAUZE 4.5X4.1 6PLY STRL (MISCELLANEOUS) ×3 IMPLANT
CANISTER SUCT 1200ML W/VALVE (MISCELLANEOUS) ×3 IMPLANT
CANISTER SUCT 3000ML PPV (MISCELLANEOUS) ×3 IMPLANT
COUNTER SPONGE BAG EZ (MISCELLANEOUS) ×1
CUFF TOURN 18 STER (MISCELLANEOUS) ×3 IMPLANT
CUFF TOURN DUAL PL 12 NO SLV (MISCELLANEOUS) ×3 IMPLANT
DRAPE FLUOR MINI C-ARM 54X84 (DRAPES) ×3 IMPLANT
DRAPE XRAY CASSETTE 23X24 (DRAPES) IMPLANT
DRESSING ALLEVYN 4X4 (MISCELLANEOUS) IMPLANT
DURAPREP 26ML APPLICATOR (WOUND CARE) ×3 IMPLANT
ELECT REM PT RETURN 9FT ADLT (ELECTROSURGICAL) ×3
ELECTRODE REM PT RTRN 9FT ADLT (ELECTROSURGICAL) ×1 IMPLANT
GAUZE PACKING 1/4 X5 YD (GAUZE/BANDAGES/DRESSINGS) ×3 IMPLANT
GAUZE PACKING IODOFORM 1X5 (MISCELLANEOUS) ×3 IMPLANT
GAUZE PETRO XEROFOAM 1X8 (MISCELLANEOUS) ×3 IMPLANT
GAUZE SPONGE 4X4 12PLY STRL (GAUZE/BANDAGES/DRESSINGS) ×3 IMPLANT
GAUZE STRETCH 2X75IN STRL (MISCELLANEOUS) ×3 IMPLANT
GLOVE BIO SURGEON STRL SZ7.5 (GLOVE) ×3 IMPLANT
GLOVE BIO SURGEON STRL SZ8 (GLOVE) ×3 IMPLANT
GLOVE INDICATOR 8.0 STRL GRN (GLOVE) ×3 IMPLANT
GOWN STRL REUS W/ TWL LRG LVL3 (GOWN DISPOSABLE) ×2 IMPLANT
GOWN STRL REUS W/TWL LRG LVL3 (GOWN DISPOSABLE) ×6
GOWN STRL REUS W/TWL MED LVL3 (GOWN DISPOSABLE) ×3 IMPLANT
HANDPIECE VERSAJET DEBRIDEMENT (MISCELLANEOUS) ×2 IMPLANT
IV NS 1000ML (IV SOLUTION) ×2
IV NS 1000ML BAXH (IV SOLUTION) ×1 IMPLANT
KIT DRSG VAC SLVR GRANUFM (MISCELLANEOUS) ×5 IMPLANT
KIT TURNOVER KIT A (KITS) ×3 IMPLANT
LABEL OR SOLS (LABEL) ×3 IMPLANT
NDL FILTER BLUNT 18X1 1/2 (NEEDLE) ×1 IMPLANT
NDL HYPO 25X1 1.5 SAFETY (NEEDLE) ×2 IMPLANT
NEEDLE FILTER BLUNT 18X 1/2SAF (NEEDLE) ×2
NEEDLE FILTER BLUNT 18X1 1/2 (NEEDLE) ×1 IMPLANT
NEEDLE HYPO 25X1 1.5 SAFETY (NEEDLE) ×6 IMPLANT
NS IRRIG 500ML POUR BTL (IV SOLUTION) ×3 IMPLANT
PACK EXTREMITY ARMC (MISCELLANEOUS) ×3 IMPLANT
PAD ABD DERMACEA PRESS 5X9 (GAUZE/BANDAGES/DRESSINGS) ×3 IMPLANT
PENCIL ELECTRO HAND CTR (MISCELLANEOUS) ×3 IMPLANT
PULSAVAC PLUS IRRIG FAN TIP (DISPOSABLE) ×3
RASP SM TEAR CROSS CUT (RASP) IMPLANT
SHIELD FULL FACE ANTIFOG 7M (MISCELLANEOUS) ×3 IMPLANT
SOL .9 NS 3000ML IRR  AL (IV SOLUTION) ×2
SOL .9 NS 3000ML IRR AL (IV SOLUTION) ×1
SOL .9 NS 3000ML IRR UROMATIC (IV SOLUTION) ×1 IMPLANT
STOCKINETTE IMPERVIOUS 9X36 MD (GAUZE/BANDAGES/DRESSINGS) ×3 IMPLANT
STOCKINETTE STRL 6IN 960660 (GAUZE/BANDAGES/DRESSINGS) ×3 IMPLANT
SUT ETHILON 2 0 FS 18 (SUTURE) ×6 IMPLANT
SUT ETHILON 3-0 FS-10 30 BLK (SUTURE) ×3
SUT ETHILON 4-0 (SUTURE) ×3
SUT ETHILON 4-0 FS2 18XMFL BLK (SUTURE) ×1
SUT VIC AB 3-0 SH 27 (SUTURE) ×3
SUT VIC AB 3-0 SH 27X BRD (SUTURE) ×1 IMPLANT
SUT VIC AB 4-0 FS2 27 (SUTURE) ×3 IMPLANT
SUTURE EHLN 3-0 FS-10 30 BLK (SUTURE) ×1 IMPLANT
SUTURE ETHLN 4-0 FS2 18XMF BLK (SUTURE) ×1 IMPLANT
SWAB CULTURE AMIES ANAERIB BLU (MISCELLANEOUS) IMPLANT
SYR 10ML LL (SYRINGE) ×3 IMPLANT
SYR 3ML LL SCALE MARK (SYRINGE) ×6 IMPLANT
TIP FAN IRRIG PULSAVAC PLUS (DISPOSABLE) ×1 IMPLANT
TRAY PREP VAG/GEN (MISCELLANEOUS) ×3 IMPLANT
WND VAC CANISTER 500ML (MISCELLANEOUS) ×3 IMPLANT

## 2017-08-09 NOTE — Progress Notes (Signed)
[place] [facility] [place] NAME: Tina Pacheco    MR#:  [id number]  DATE OF BIRTH:  [birth year]  SUBJECTIVE:   No acute events overnight.  Plan for surgery this morning.  REVIEW OF SYSTEMS:    Review of Systems  Constitutional: Negative for fever, chills weight loss HENT: Negative for ear pain, nosebleeds, congestion, facial swelling, rhinorrhea, neck pain, neck stiffness and ear discharge.   Respiratory: Negative for cough, shortness of breath, wheezing  Cardiovascular: Negative for chest pain, palpitations and leg swelling.  Gastrointestinal: Negative for heartburn, abdominal pain, vomiting, diarrhea or consitpation Genitourinary: Negative for dysuria, urgency, frequency, hematuria Musculoskeletal: Negative for back pain or joint pain Neurological: Negative for dizziness, seizures, syncope, focal weakness,  numbness and headaches.  Hematological: Does not bruise/bleed easily.  Psychiatric/Behavioral: Negative for hallucinations, confusion, dysphoric mood Skin: Nonhealing right foot fracture with wound dehiscence   Tolerating Diet: npo      DRUG ALLERGIES:  No Known Allergies  VITALS:  Blood pressure (!) 140/57, pulse (!) 57, temperature 97.9 F (36.6 C), temperature source Oral, resp. rate 18, height 5\' 3"  (1.6 m), weight 130.4 kg (287 lb 6.4 oz), SpO2 96 %.  PHYSICAL EXAMINATION:  Constitutional: Appears well-developed and well-nourished. No distress. HENT: Normocephalic. Marland Kitchen Oropharynx is clear and moist.  Eyes: Conjunctivae and EOM are normal. PERRLA, no scleral icterus.  Neck: Normal ROM. Neck supple. No JVD. No tracheal deviation. CVS: RRR, S1/S2 +, no murmurs, no gallops, no carotid bruit.  Pulmonary: Effort and breath sounds normal, no stridor, rhonchi, wheezes, rales.  Abdominal: Soft. BS +,  no distension, tenderness, rebound or guarding.  Musculoskeletal: Normal range of motion. No edema and no tenderness.  Neuro: Alert. CN  2-12 grossly intact. No focal deficits. Skin: Right foot is wrapped in Ace bandage/soft cast psychiatric: Normal mood and affect.      LABORATORY PANEL:   CBC Recent Labs  Lab 08/09/17 0437  WBC 4.5  HGB 11.1*  HCT 33.4*  PLT 161   ------------------------------------------------------------------------------------------------------------------  Chemistries  Recent Labs  Lab 08/08/17 1216 08/09/17 0437  NA 140 139  K 3.6 3.5  CL 104 105  CO2 28 28  GLUCOSE 93 96  BUN 21* 23*  CREATININE 1.11* 0.97  CALCIUM 9.5 9.9  AST 23  --   ALT 11*  --   ALKPHOS 87  --   BILITOT 1.0  --    ------------------------------------------------------------------------------------------------------------------  Cardiac Enzymes No results for input(s): TROPONINI in the last 168 hours. ------------------------------------------------------------------------------------------------------------------  RADIOLOGY:  No results found.   ASSESSMENT AND PLAN:   67 year old female with history of rheumatoid arthritis, PAF on anticoagulation and chronic diastolic heart failure who presented to the emergency room from the podiatry office due to wound dehiscence for chronic nonhealing right foot fracture status post extensive surgery.  1.  Right foot infection from wound dehiscence: Plan for debridement and possible wound VAC placement today Continue vancomycin and Zosyn Management as per podiatry.  2.  Rheumatoid arthritis: Continue Arava and Plaquenil  3.  Essential hypertension: Continue Norvasc and metoprolol  4.  Chronic diastolic heart failure without signs of exacerbation Continue Demadex  5.  PAF: Continue metoprolol for heart rate control Pradaxa discontinued for now due to surgery Restart after surgery when okay by podiatry.  6.  Hyperlipidemia: Continue statin     Management plans discussed with the patient and she is in agreement.  CODE STATUS: limited  TOTAL  TIME TAKING CARE OF THIS  PATIENT: 30 minutes.     POSSIBLE D/C 2-3 days, DEPENDING ON CLINICAL CONDITION.   Bindu Docter M.D on 08/09/2017 at 7:56 AM  Between 7am to 6pm - Pager - [phone] After 6pm go to [email or web address] - password [email or web address] [place] [facility]  Office  [phone]  CC: Primary care physician; Venia Carbon, MD  Note: This dictation was prepared with Dragon dictation along with smaller phrase technology. Any transcriptional errors that result from this process are unintentional.

## 2017-08-09 NOTE — Clinical Social Work Note (Signed)
Clinical Social Work Assessment  Patient Details  Name: Tina Pacheco MRN: [id number] Date of Birth: 07-29-1950  Date of referral:  08/09/17               Reason for consult:  Facility Placement                Permission sought to share information with:  Case Manager, Customer service manager, Family Supports Permission granted to share information::  Yes, Verbal Permission Granted  Name::        Agency::     Relationship::     Contact Information:     Housing/Transportation Living arrangements for the past 2 months:  [place] of Information:  Patient Patient Interpreter Needed:  None Criminal Activity/Legal Involvement Pertinent to Current Situation/Hospitalization:  No - Comment as needed Significant Relationships:  Adult Children, Spouse Lives with:  Spouse Do you feel safe going back to the place where you live?  Yes Need for family participation in patient care:  Yes (Comment)  Care giving concerns:  Patient normally lives with her husband but has been at [facility] for short term rehab for the past 3 weeks.    Social Worker assessment / plan:  CSW consulted for facility placement. CSW met with patient, husband Tina Pacheco and daughter Tina Pacheco were at bedside. CSW introduced self and role. Patient states that she was here at [facility] about 3 weeks ago for the same problem and was discharged to [facility] for rehab. She plans to return to [facility] to continue rehab. Patient is frustrated that she had to come back to the hospital for her foot. She states that she really just wants to get better and go home. She does understand that she needs to continue with her rehab and is willing to do so. CSW also spoke with Tina Pacheco at [facility]. Tina Pacheco states that patient is from [facility] for rehab and they will accept her back when she is ready for discharge. CSW will continue to follow for discharge planning.   Employment status:   Retired Forensic scientist:  Medicare PT Recommendations:  Not assessed at this time [place] / Referral to community resources:     Patient/Family's Response to care:  Patient is frustrated that she is having so many complications with her foot   Patient/Family's Understanding of and Emotional Response to Diagnosis, Current Treatment, and Prognosis:  Family is very supportive and understand current plan.   Emotional Assessment Appearance:  Appears stated age Attitude/Demeanor/Rapport:    Affect (typically observed):  Appropriate, Calm, Frustrated Orientation:  Oriented to Self, Oriented to Place, Oriented to  Time, Oriented to Situation Alcohol / Substance use:  Not Applicable Psych involvement (Current and /or in the community):  No (Comment)  Discharge Needs  Concerns to be addressed:  Discharge Planning Concerns Readmission within the last 30 days:  Yes Current discharge risk:  Dependent with Mobility Barriers to Discharge:  Continued Medical Work up   [employer], [place] 08/09/2017, 10:14 AM

## 2017-08-09 NOTE — Anesthesia Preprocedure Evaluation (Signed)
Anesthesia Evaluation  Patient identified by MRN, date of birth, ID band Patient awake    Reviewed: Allergy & Precautions, NPO status , Patient's Chart, lab work & pertinent test results, reviewed documented beta blocker date and time   Airway Mallampati: III  TM Distance: >3 FB     Dental  (+) Chipped, Missing   Pulmonary asthma , sleep apnea , COPD,           Cardiovascular hypertension, Pt. on medications and Pt. on home beta blockers + CAD  + dysrhythmias Atrial Fibrillation      Neuro/Psych  Headaches, Anxiety    GI/Hepatic   Endo/Other  Morbid obesity  Renal/GU      Musculoskeletal  (+) Arthritis ,   Abdominal   Peds  Hematology  (+) anemia ,   Anesthesia Other Findings Echo ok. Heart and lungs OK.  Reproductive/Obstetrics                             Anesthesia Physical  Anesthesia Plan  ASA: III  Anesthesia Plan: General   Post-op Pain Management:    Induction: Intravenous  PONV Risk Score and Plan:   Airway Management Planned: Oral ETT  Additional Equipment:   Intra-op Plan:   Post-operative Plan: Extubation in OR  Informed Consent: I have reviewed the patients History and Physical, chart, labs and discussed the procedure including the risks, benefits and alternatives for the proposed anesthesia with the patient or authorized representative who has indicated his/her understanding and acceptance.     Plan Discussed with: CRNA and Surgeon  Anesthesia Plan Comments:         Anesthesia Quick Evaluation

## 2017-08-09 NOTE — Progress Notes (Signed)
Patient take off the floor by OR transport to Pre-OP, report previously given to [place], Therapist, sports

## 2017-08-09 NOTE — NC FL2 (Signed)
[place] LEVEL OF CARE SCREENING TOOL     IDENTIFICATION  Patient Name: Tina Pacheco Birthdate: 1950-11-15 Sex: female Admission Date (Current Location): 08/08/2017  [place] and [place] Number:  Engineering geologist and Address:  [facility], [address] [place], [place] [address]      Provider Number: [id number]  Attending Physician Name and Address:  Bettey Costa, MD  Relative Name and Phone Number:  Lindi, Abram [phone]    Current Level of Care: Hospital Recommended Level of Care: [place] Prior Approval Number:    Date Approved/Denied:   PASRR Number:    Discharge Plan: SNF    Current Diagnoses: Patient Active Problem List   Diagnosis Date Noted  . Cellulitis 08/08/2017  . Acute pain in joint 07/13/2017  . Preop examination 07/06/2017  . Chronic diastolic heart failure ([place]) 07/06/2017  . Nausea 05/08/2017  . Narcotic dependence ([place]) 03/09/2017  . Vertigo 06/28/2016  . Bronchiectasis without acute exacerbation ([place]) 06/15/2015  . DDD (degenerative disc disease), lumbosacral 03/30/2015  . Anemia 09/08/2014  . Sleep apnea   . Coronary artery disease, non-occlusive 08/25/2014  . PAF (paroxysmal atrial fibrillation) ([place]) 08/25/2014  . 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 RLS (restless legs syndrome) 07/06/2014  . Gout 05/26/2014  . Chronic pain syndrome 05/26/2014  . Migraine 04/08/2014  . Acquired left calf asymmetry 01/05/2014  . Allergic conjunctivitis 09/16/2013  . Back pain 07/22/2013  . Metatarsalgia of right foot 07/22/2013  . Insomnia 07/15/2013  . OA (osteoarthritis) 11/20/2012  . DJD (degenerative joint disease) of cervical spine 09/10/2012  . Chronic cough 08/17/2011  . Allergic rhinitis 07/10/2011  . Rheumatoid arthritis ([place]) 03/06/2011  . Severe obesity (BMI >= 40) ([place]) 06/22/2010  . Hyperlipidemia 06/22/2010  . HTN (hypertension) 06/22/2010  .  Diastolic dysfunction 52/77/8242  . Long term current use of anticoagulant therapy 05/27/2010    Orientation RESPIRATION BLADDER Height & Weight     Self, Time, Situation, Place  Normal Continent Weight: 287 lb 6.4 oz (130.4 kg) Height:  5\' 3"  (160 cm)  BEHAVIORAL SYMPTOMS/MOOD NEUROLOGICAL BOWEL NUTRITION STATUS  (None) (None) Continent Diet(NPO to be advanced )  AMBULATORY STATUS COMMUNICATION OF NEEDS Skin   Extensive Assist Verbally Surgical wounds(Open incision on foot )                       Personal Care Assistance Level of Assistance  Bathing, Feeding, Dressing Bathing Assistance: Limited assistance Feeding assistance: Independent Dressing Assistance: Limited assistance     Functional Limitations Info  Sight, Hearing, Speech Sight Info: Adequate Hearing Info: Adequate Speech Info: Adequate    SPECIAL CARE FACTORS FREQUENCY  PT (By licensed PT), OT (By licensed OT)     PT Frequency: 5x/week OT Frequency: 5x/week            Contractures Contractures Info: Not present    Additional Factors Info  Code Status, Allergies Code Status Info: Full Code  Allergies Info: NKA           Current Medications (08/09/2017):  This is the current hospital active medication list Current Facility-Administered Medications  Medication Dose Route Frequency Provider Last Rate Last Dose  . acetaminophen (TYLENOL) tablet 650 mg  650 mg Oral Q6H PRN Gladstone Lighter, MD       Or  . acetaminophen (TYLENOL) suppository 650 mg  650 mg Rectal Q6H PRN Gladstone Lighter, MD      .  amLODipine (NORVASC) tablet 5 mg  5 mg Oral Daily Gladstone Lighter, MD      . atorvastatin (LIPITOR) tablet 20 mg  20 mg Oral QPM Gladstone Lighter, MD   20 mg at 08/08/17 1830  . diphenhydrAMINE (BENADRYL) capsule 25 mg  25 mg Oral Daily PRN Gladstone Lighter, MD      . docusate sodium (COLACE) capsule 100 mg  100 mg Oral BID Gladstone Lighter, MD      . enoxaparin (LOVENOX) injection 40 mg  40  mg Subcutaneous Q12H Gladstone Lighter, MD      . febuxostat (ULORIC) tablet 40 mg  40 mg Oral Daily Gladstone Lighter, MD      . folic acid (FOLVITE) tablet 1 mg  1 mg Oral Daily Gladstone Lighter, MD      . hydroxychloroquine (PLAQUENIL) tablet 200 mg  200 mg Oral BID Gladstone Lighter, MD   200 mg at 08/08/17 2131  . leflunomide (ARAVA) tablet 20 mg  20 mg Oral Daily Gladstone Lighter, MD      . metoprolol succinate (TOPROL-XL) 24 hr tablet 50 mg  50 mg Oral QPM Gladstone Lighter, MD      . montelukast (SINGULAIR) tablet 10 mg  10 mg Oral QHS Gladstone Lighter, MD   10 mg at 08/08/17 2131  . morphine 2 MG/ML injection 2 mg  2 mg Intravenous Q4H PRN Gladstone Lighter, MD      . multivitamin with minerals tablet 1 tablet  1 tablet Oral Daily Gladstone Lighter, MD      . ondansetron [facility]) tablet 4 mg  4 mg Oral Q6H PRN Gladstone Lighter, MD       Or  . ondansetron (ZOFRAN) injection 4 mg  4 mg Intravenous Q6H PRN Gladstone Lighter, MD   4 mg at 08/09/17 0840  . oxyCODONE (Oxy IR/ROXICODONE) immediate release tablet 10 mg  10 mg Oral Q4H PRN Gladstone Lighter, MD   10 mg at 08/08/17 2144  . polyethylene glycol (MIRALAX / GLYCOLAX) packet 17 g  17 g Oral Daily PRN Gladstone Lighter, MD      . potassium chloride (K-DUR,KLOR-CON) CR tablet 10 mEq  10 mEq Oral Daily Gladstone Lighter, MD      . pregabalin (LYRICA) capsule 150 mg  150 mg Oral TID Gladstone Lighter, MD   150 mg at 08/08/17 2130  . sodium chloride (OCEAN) 0.65 % nasal spray 1 spray  1 spray Each Nare PRN Gladstone Lighter, MD      . terazosin (HYTRIN) capsule 10 mg  10 mg Oral QHS Gladstone Lighter, MD   10 mg at 08/08/17 2131  . torsemide (DEMADEX) tablet 20 mg  20 mg Oral Daily Gladstone Lighter, MD         Discharge Medications: Please see discharge summary for a list of discharge medications.  Relevant Imaging Results:  Relevant Lab Results:   Additional Information    Zeenat Jeanbaptiste  Louretta Shorten,  LCSWA

## 2017-08-09 NOTE — Progress Notes (Signed)
While rounding Chaplain saw daughter in hallway. Daughter said mother is going to surgery. Chaplain asked if they would like prayer. Daughter said yes and checked the room. Mother was sleep. Chaplain prayer silently over the Pt and for the family. Chaplain will follow up after surgery.    08/09/17 1000  Clinical Encounter Type  Visited With Patient and family together  Visit Type Spiritual support  Referral From [place]

## 2017-08-09 NOTE — Progress Notes (Signed)
Sleeping without distress. Call bell in reach. Rt leg elevated.

## 2017-08-09 NOTE — Anesthesia Post-op Follow-up Note (Signed)
Anesthesia QCDR form completed.        

## 2017-08-09 NOTE — Progress Notes (Signed)
Family Meeting Note  Advance Directive:no  Today a meeting took place with the Patient.   The following clinical team members were present during this meeting:MD  The following were discussed:Patient's diagnosis: Chronic foot infection status post surgery with wound dehiscence PAF Chronic diastolic heart failure, Patient's progosis: > 12 months and Goals for treatment: Limited code okay to do CPR but no mechanical ventilation  Additional follow-up to be provided: She has information regarding creating advanced directives at bedside. She will be working on this while in the hospital.  Time spent during discussion: 16 minutes  Einer Meals, MD

## 2017-08-09 NOTE — Transfer of Care (Signed)
Immediate Anesthesia Transfer of Care Note  Patient: Tina Pacheco  Procedure(s) Performed: IRRIGATION AND DEBRIDEMENT FOOT (Right ) APPLICATION OF WOUND VAC (Right )  Patient Location: PACU  Anesthesia Type:General  Level of Consciousness: awake  Airway & Oxygen Therapy: Patient Spontanous Breathing and Patient connected to face mask oxygen  Post-op Assessment: Report given to RN and Post -op Vital signs reviewed and stable  Post vital signs: Reviewed and stable  Last Vitals:  Vitals Value Taken Time  BP 111/98 08/09/2017 12:23 PM  Temp    Pulse 61 08/09/2017 12:26 PM  Resp 17 08/09/2017 12:26 PM  SpO2 98 % 08/09/2017 12:26 PM  Vitals shown include unvalidated device data.  Last Pain:  Vitals:   08/09/17 1107  TempSrc: Tympanic  PainSc: 0-No pain         Complications: No apparent anesthesia complications

## 2017-08-09 NOTE — H&P (Signed)
H and P has been reviewed and no changes are noted.  

## 2017-08-09 NOTE — Op Note (Signed)
Operative note   Surgeon: Dr. Albertine Patricia, DPM.    Assistant: None    Preop diagnosis: Wound dehiscence to dorsal wounds right foot secondary to hematoma     Postop diagnosis: Same    Procedure:   1.  Debridement of wounds, excisional debridement, including skin structures and subcutaneous and fat tissue.  Right foot   2.  Application of wound VAC to both wounds.  Right foot       EBL: 15 cc    Anesthesia:general delivered by the anesthesia team and I injected 5 cc of 0.5% Marcaine plain with epinephrine around the wounds to control bleeding during the surgery.    Hemostasis: See above no tourniquet was used.    Specimen: None but there was a culture that I did in some of the deeper tissue.    Complications: None    Operative indications: Late stage wound dehiscence approximately 6 weeks postop    Procedure:  Patient was brought into the OR and placed on the operating table in thesupine position. After anesthesia was obtained theright lower extremity was prepped and draped in usual sterile fashion.  Operative Report: At this time attention was directed to the dorsum of the right foot.  Wound VAC was set to level 2 and used to debride devitalized necrotic skin and subcutaneous tissue and fat.  Once this was removed there was good bleeding tissue that seem to be healthy.  This was done from both wounds.  Both wounds were compressed and explored no pus pockets were identified.  No sinus tracts were identified no evidence of purulence in the wound at all.  I did do a culture as mentioned above. Once the wounds were clean I applied a silver granular foam and started a KCI wound VAC at 125 mm continuous.  Blood loss is fairly minimal during the procedure.    Patient tolerated the procedure and anesthesia well.  Was transported from the OR to the PACU with all vital signs stable and vascular status intact. To be discharged per routine protocol.  Will follow up in approximately 1 week  in the outpatient clinic.

## 2017-08-09 NOTE — Progress Notes (Signed)
Assessment done. Awake in bed with rt foot elevated on pillows. Pt is understanding of npo status after mn and scheduled procedure tomorrow. Pt wants to speak with MD in am prior to signing consent . Call bell in reah and pt instructed to call for needs.

## 2017-08-09 NOTE — Anesthesia Procedure Notes (Signed)
Procedure Name: Intubation Date/Time: 08/09/2017 1:35 PM Performed by: Allean Found, CRNA Pre-anesthesia Checklist: Patient identified, Emergency Drugs available, Suction available, Patient being monitored and Timeout performed Patient Re-evaluated:Patient Re-evaluated prior to induction Oxygen Delivery Method: Circle system utilized Preoxygenation: Pre-oxygenation with 100% oxygen Induction Type: IV induction Ventilation: Mask ventilation without difficulty Laryngoscope Size: Mac and 3 Grade View: Grade II Tube type: Oral Number of attempts: 1 Placement Confirmation: ETT inserted through vocal cords under direct vision,  positive ETCO2 and breath sounds checked- equal and bilateral Secured at: 21 cm Tube secured with: Tape Dental Injury: Teeth and Oropharynx as per pre-operative assessment

## 2017-08-10 LAB — CBC
HEMATOCRIT: 32.9 % — AB (ref 35.0–47.0)
Hemoglobin: 10.8 g/dL — ABNORMAL LOW (ref 12.0–16.0)
MCH: 28.6 pg (ref 26.0–34.0)
MCHC: 32.8 g/dL (ref 32.0–36.0)
MCV: 86.9 fL (ref 80.0–100.0)
Platelets: 146 10*3/uL — ABNORMAL LOW (ref 150–440)
RBC: 3.79 MIL/uL — ABNORMAL LOW (ref 3.80–5.20)
RDW: 14.9 % — AB (ref 11.5–14.5)
WBC: 3.8 10*3/uL (ref 3.6–11.0)

## 2017-08-10 LAB — BASIC METABOLIC PANEL
Anion gap: 4 — ABNORMAL LOW (ref 5–15)
BUN: 17 mg/dL (ref 6–20)
CALCIUM: 9.3 mg/dL (ref 8.9–10.3)
CO2: 27 mmol/L (ref 22–32)
CREATININE: 0.89 mg/dL (ref 0.44–1.00)
Chloride: 108 mmol/L (ref 101–111)
GFR calc Af Amer: >60 mL/min (ref >60)
GFR calc non Af Amer: >60 mL/min (ref >60)
GLUCOSE: 86 mg/dL (ref 65–99)
Potassium: 3.7 mmol/L (ref 3.5–5.1)
Sodium: 139 mmol/L (ref 135–145)

## 2017-08-10 MED ORDER — CHLORHEXIDINE GLUCONATE CLOTH 2 % EX PADS
6.0000 | MEDICATED_PAD | Freq: Every day | CUTANEOUS | Status: DC
  Administered 2017-08-10: 6 via TOPICAL

## 2017-08-10 MED ORDER — POLYETHYLENE GLYCOL 3350 17 G PO PACK
17.0000 g | PACK | Freq: Every day | ORAL | Status: DC
  Administered 2017-08-10 – 2017-08-11 (×2): 17 g via ORAL
  Filled 2017-08-10 (×2): qty 1

## 2017-08-10 MED ORDER — VANCOMYCIN HCL 10 G IV SOLR
1500.0000 mg | Freq: Once | INTRAVENOUS | Status: AC
  Administered 2017-08-10: 1500 mg via INTRAVENOUS
  Filled 2017-08-10: qty 1500

## 2017-08-10 MED ORDER — BISACODYL 10 MG RE SUPP
10.0000 mg | Freq: Every day | RECTAL | Status: DC
  Administered 2017-08-10: 10 mg via RECTAL
  Filled 2017-08-10: qty 1

## 2017-08-10 MED ORDER — DABIGATRAN ETEXILATE MESYLATE 150 MG PO CAPS
150.0000 mg | ORAL_CAPSULE | Freq: Two times a day (BID) | ORAL | Status: DC
  Administered 2017-08-10 – 2017-08-11 (×2): 150 mg via ORAL
  Filled 2017-08-10 (×4): qty 1

## 2017-08-10 MED ORDER — MUPIROCIN 2 % EX OINT
1.0000 "application " | TOPICAL_OINTMENT | Freq: Two times a day (BID) | CUTANEOUS | Status: DC
  Administered 2017-08-10 (×2): 1 via NASAL
  Filled 2017-08-10: qty 22

## 2017-08-10 MED ORDER — VANCOMYCIN HCL 10 G IV SOLR
1500.0000 mg | INTRAVENOUS | Status: DC
  Administered 2017-08-11: 1500 mg via INTRAVENOUS
  Filled 2017-08-10: qty 1500

## 2017-08-10 NOTE — Progress Notes (Signed)
[place] [facility] [place] NAME: Tina Pacheco    MR#:  [id number]  DATE OF BIRTH:  September 01, 1950  SUBJECTIVE:   Patient has wound VAC placed.  No acute events overnight.  REVIEW OF SYSTEMS:    Review of Systems  Constitutional: Negative for fever, chills weight loss HENT: Negative for ear pain, nosebleeds, congestion, facial swelling, rhinorrhea, neck pain, neck stiffness and ear discharge.   Respiratory: Negative for cough, shortness of breath, wheezing  Cardiovascular: Negative for chest pain, palpitations and leg swelling.  Gastrointestinal: Negative for heartburn, abdominal pain, vomiting, diarrhea positive consitpation Genitourinary: Negative for dysuria, urgency, frequency, hematuria Musculoskeletal: Negative for back pain or joint pain Neurological: Negative for dizziness, seizures, syncope, focal weakness,  numbness and headaches.  Hematological: Does not bruise/bleed easily.  Psychiatric/Behavioral: Negative for hallucinations, confusion, dysphoric mood    Tolerating Diet: yes      DRUG ALLERGIES:  No Known Allergies  VITALS:  Blood pressure (!) 136/49, pulse (!) 54, temperature 97.7 F (36.5 C), resp. rate 18, height 5\' 3"  (1.6 m), weight 130.2 kg (287 lb), SpO2 95 %.  PHYSICAL EXAMINATION:  Constitutional: Appears well-developed and well-nourished. No distress. HENT: Normocephalic. Marland Kitchen Oropharynx is clear and moist.  Eyes: Conjunctivae and EOM are normal. PERRLA, no scleral icterus.  Neck: Normal ROM. Neck supple. No JVD. No tracheal deviation. CVS: RRR, S1/S2 +, no murmurs, no gallops, no carotid bruit.  Pulmonary: Effort and breath sounds normal, no stridor, rhonchi, wheezes, rales.  Abdominal: Soft. BS +,  no distension, tenderness, rebound or guarding.  Musculoskeletal: Normal range of motion. No edema and no tenderness.  Neuro: Alert. CN 2-12 grossly intact. No focal deficits. Skin: Wound VAC placed  psychiatric: Normal  mood and affect.      LABORATORY PANEL:   CBC Recent Labs  Lab 08/10/17 0602  WBC 3.8  HGB 10.8*  HCT 32.9*  PLT 146*   ------------------------------------------------------------------------------------------------------------------  Chemistries  Recent Labs  Lab 08/08/17 1216  08/10/17 0602  NA 140   < > 139  K 3.6   < > 3.7  CL 104   < > 108  CO2 28   < > 27  GLUCOSE 93   < > 86  BUN 21*   < > 17  CREATININE 1.11*   < > 0.89  CALCIUM 9.5   < > 9.3  AST 23  --   --   ALT 11*  --   --   ALKPHOS 87  --   --   BILITOT 1.0  --   --    < > = values in this interval not displayed.   ------------------------------------------------------------------------------------------------------------------  Cardiac Enzymes No results for input(s): TROPONINI in the last 168 hours. ------------------------------------------------------------------------------------------------------------------  RADIOLOGY:  No results found.   ASSESSMENT AND PLAN:   67 year old female with history of rheumatoid arthritis, PAF on anticoagulation and chronic diastolic heart failure who presented to the emergency room from the podiatry office due to wound dehiscence for chronic nonhealing right foot fracture status post extensive surgery.  1.  Right foot infection from wound dehiscence: Patient is postoperative day #1 debridement and  wound VAC placement  Plan to change wound VAC tomorrow  Wound culture showing GPC  continue vancomycin and Zosyn   2.  Rheumatoid arthritis: Continue Arava and Plaquenil  3.  Essential hypertension: Continue Norvasc and metoprolol  4.  Chronic diastolic heart failure without signs of exacerbation Continue Demadex  5.  PAF: Continue  metoprolol for heart rate control Continue Pradaxa  6.  Hyperlipidemia: Continue statin 7.  Constipation: Add stool softeners  Discussed with Dr. Vickki Muff  Management plans discussed with the patient and she is in  agreement.  CODE STATUS: Full TOTAL TIME TAKING CARE OF THIS PATIENT: 24 minutes.     POSSIBLE D/C tomorrow to skilled nursing facility  Tina Pacheco M.D on 08/10/2017 at 11:12 AM  Between 7am to 6pm - Pager - [phone] After 6pm go to [email or web address] - password [email or web address] [facility]  Office  [phone]  CC: Primary care physician; Venia Carbon, MD  Note: This dictation was prepared with Dragon dictation along with smaller phrase technology. Any transcriptional errors that result from this process are unintentional.

## 2017-08-10 NOTE — Care Management Important Message (Signed)
Copy of signed IM left with patient in room.  

## 2017-08-10 NOTE — Progress Notes (Signed)
Pharmacy Antibiotic Note  [place] is a 67 y.o. female admitted on 08/08/2017 with wound dehiscence to dorsal wounds right foot secondary hematoma. Pharmacy has been consulted for vancomycin dosing. Patient's right foot culture growing abundant staphylococcus aureus.   Plan: Vancomycin 1500mg  IV x 1. Will continue patient on vancomycin 1500mg  IV Q24hr for goal trough of 15-20 with next dose scheduled for 0400 on 6/15. Will obtain trough prior to dose on 6/18.   Height: 5\' 3"  (160 cm) Weight: 287 lb (130.2 kg) IBW/kg (Calculated) : 52.4  Temp (24hrs), Avg:98 F (36.7 C), Min:97.7 F (36.5 C), Max:98.6 F (37 C)  Recent Labs  Lab 08/08/17 1216 08/09/17 0437 08/10/17 0602  WBC 4.7 4.5 3.8  CREATININE 1.11* 0.97 0.89    Estimated Creatinine Clearance: 80.9 mL/min (by C-G formula based on SCr of 0.89 mg/dL).    No Known Allergies  Antimicrobials this admission: Zosyn 6/13 >> 6/14  Vancomycin 6/14 >>   Dose adjustments this admission: N/A  Microbiology results: 6/13 Right Foot Cx: Abundant Staph Aureus  6/12 MRSA PCR: Negative; Staphylococcus aureus: positive   Thank you for allowing pharmacy to be a part of this patient's care.  Simpson,Michael L 08/10/2017 3:58 PM

## 2017-08-10 NOTE — Anesthesia Postprocedure Evaluation (Signed)
Anesthesia Post Note  Patient: Tina Pacheco  Procedure(s) Performed: IRRIGATION AND DEBRIDEMENT FOOT (Right ) APPLICATION OF WOUND VAC (Right )  Patient location during evaluation: PACU Anesthesia Type: General Level of consciousness: awake and alert and oriented Pain management: pain level controlled Vital Signs Assessment: post-procedure vital signs reviewed and stable Respiratory status: spontaneous breathing Cardiovascular status: blood pressure returned to baseline Anesthetic complications: no     Last Vitals:  Vitals:   08/10/17 0824 08/10/17 1200  BP: (!) 136/49 (!) 121/54  Pulse: (!) 54 (!) 56  Resp: 18   Temp: 36.5 C 36.7 C  SpO2: 95% 95%    Last Pain:  Vitals:   08/10/17 1200  TempSrc: Oral  PainSc:                  [name]

## 2017-08-11 DIAGNOSIS — R2689 Other abnormalities of gait and mobility: Secondary | ICD-10-CM | POA: Diagnosis not present

## 2017-08-11 DIAGNOSIS — Z7401 Bed confinement status: Secondary | ICD-10-CM | POA: Diagnosis not present

## 2017-08-11 DIAGNOSIS — N3281 Overactive bladder: Secondary | ICD-10-CM | POA: Diagnosis not present

## 2017-08-11 DIAGNOSIS — M6281 Muscle weakness (generalized): Secondary | ICD-10-CM | POA: Diagnosis not present

## 2017-08-11 DIAGNOSIS — T148XXA Other injury of unspecified body region, initial encounter: Secondary | ICD-10-CM | POA: Diagnosis not present

## 2017-08-11 DIAGNOSIS — I480 Paroxysmal atrial fibrillation: Secondary | ICD-10-CM | POA: Diagnosis not present

## 2017-08-11 DIAGNOSIS — M79671 Pain in right foot: Secondary | ICD-10-CM | POA: Diagnosis not present

## 2017-08-11 DIAGNOSIS — Z9889 Other specified postprocedural states: Secondary | ICD-10-CM | POA: Diagnosis not present

## 2017-08-11 DIAGNOSIS — Z4789 Encounter for other orthopedic aftercare: Secondary | ICD-10-CM | POA: Diagnosis not present

## 2017-08-11 DIAGNOSIS — M1990 Unspecified osteoarthritis, unspecified site: Secondary | ICD-10-CM | POA: Diagnosis not present

## 2017-08-11 DIAGNOSIS — I10 Essential (primary) hypertension: Secondary | ICD-10-CM | POA: Diagnosis not present

## 2017-08-11 DIAGNOSIS — M069 Rheumatoid arthritis, unspecified: Secondary | ICD-10-CM | POA: Diagnosis not present

## 2017-08-11 DIAGNOSIS — M25512 Pain in left shoulder: Secondary | ICD-10-CM | POA: Diagnosis not present

## 2017-08-11 DIAGNOSIS — E785 Hyperlipidemia, unspecified: Secondary | ICD-10-CM | POA: Diagnosis not present

## 2017-08-11 DIAGNOSIS — K5900 Constipation, unspecified: Secondary | ICD-10-CM | POA: Diagnosis not present

## 2017-08-11 DIAGNOSIS — J449 Chronic obstructive pulmonary disease, unspecified: Secondary | ICD-10-CM | POA: Diagnosis not present

## 2017-08-11 DIAGNOSIS — M25571 Pain in right ankle and joints of right foot: Secondary | ICD-10-CM | POA: Diagnosis not present

## 2017-08-11 LAB — BASIC METABOLIC PANEL
ANION GAP: 5 (ref 5–15)
BUN: 18 mg/dL (ref 6–20)
CALCIUM: 9 mg/dL (ref 8.9–10.3)
CO2: 24 mmol/L (ref 22–32)
Chloride: 110 mmol/L (ref 101–111)
Creatinine, Ser: 0.74 mg/dL (ref 0.44–1.00)
Glucose, Bld: 94 mg/dL (ref 65–99)
POTASSIUM: 3.6 mmol/L (ref 3.5–5.1)
Sodium: 139 mmol/L (ref 135–145)

## 2017-08-11 MED ORDER — ATORVASTATIN CALCIUM 20 MG PO TABS: 20.0000 mg | ORAL_TABLET | Freq: Every evening | ORAL | Status: DC

## 2017-08-11 MED ORDER — AMLODIPINE BESYLATE 5 MG PO TABS: ORAL_TABLET | ORAL | 3 refills | Status: DC

## 2017-08-11 MED ORDER — TOLTERODINE TARTRATE ER 2 MG PO CP24: 2.0000 mg | ORAL_CAPSULE | Freq: Every day | ORAL | Status: DC | PRN

## 2017-08-11 MED ORDER — METOPROLOL SUCCINATE ER 50 MG PO TB24: 50.0000 mg | ORAL_TABLET | Freq: Every evening | ORAL | Status: DC

## 2017-08-11 MED ORDER — TORSEMIDE 20 MG PO TABS: ORAL_TABLET | ORAL | 3 refills | Status: DC

## 2017-08-11 MED ORDER — SULFAMETHOXAZOLE-TRIMETHOPRIM 800-160 MG PO TABS: 1.0000 | ORAL_TABLET | Freq: Two times a day (BID) | ORAL | 0 refills | Status: AC

## 2017-08-11 MED ORDER — SULFAMETHOXAZOLE-TRIMETHOPRIM 800-160 MG PO TABS: 1.0000 | ORAL_TABLET | Freq: Two times a day (BID) | ORAL | Status: DC

## 2017-08-11 NOTE — Discharge Summary (Signed)
[facility] [place] NAME: Tina Pacheco    MR#:  [id number]  DATE OF BIRTH:  1950/05/25  DATE OF ADMISSION:  08/08/2017 ADMITTING PHYSICIAN: Gladstone Lighter, MD  DATE OF DISCHARGE: August 11, 2017  PRIMARY CARE PHYSICIAN: Venia Carbon, MD    ADMISSION DIAGNOSIS:  infected surg sight IND  DISCHARGE DIAGNOSIS:  Active Problems: MSSA right foot infection  SECONDARY DIAGNOSIS:   Past Medical History:  Diagnosis Date  . Arthritis    RA  . Asthma   . Basal cell carcinoma   . Cataract 2018   bilateral eyes; corrected with surgery  . Claustrophobia   . COPD (chronic obstructive pulmonary disease) ([place])   . Diastolic dysfunction    a. echo 07/2014: EF 55-60%, no RWMA, GR2DD, mild MR, LA moderately dilated, PASP 38 mm Hg  . Dyslipidemia   . Dysrhythmia   . Headache    migraines  . Hemihypertrophy   . History of cardiac cath    a. 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 History of gout   . Hyperplastic colonic polyp 2003  . Hypertension   . Hypokalemia   . Morbid obesity ([place])   . PAF (paroxysmal atrial fibrillation) (HCC)    a. on Pradaxa; b. CHADSVASc at least 2 (HTN & female)  . Rheumatoid arthritis(714.0)   . Sleep apnea    a. not compliant with CPAP    HOSPITAL COURSE:  68 year old female with history of rheumatoid arthritis, PAF on anticoagulation and chronic diastolic heart failure who presented to the emergency room from the podiatry office due to wound dehiscence for chronic nonhealing right foot fracture status post extensive surgery.  1.    Pansensitive Staphylococcus aureus right foot infection from wound dehiscence: Patient is postoperative day #2 debridement and  wound VAC placement  Patient will need wound VAC change every 3 days.  Wound VAC was changed on the day of discharge.  Patient will follow-up with Dr. Elvina Mattes in 1 week. Weightbearing as tolerated however not on the right foot  2.   Rheumatoid arthritis: Continue Arava and Plaquenil  3.  Essential hypertension: Continue Norvasc and metoprolol  4.  Chronic diastolic heart failure without signs of exacerbation Continue Demadex  5.  PAF: Continue metoprolol for heart rate control Continue Pradaxa  6.  Hyperlipidemia: Continue statin 7.  Constipation: Resolved    DISCHARGE CONDITIONS AND DIET:  Regular diet stable for discharge  CONSULTS OBTAINED:    DRUG ALLERGIES:  No Known Allergies  DISCHARGE MEDICATIONS:   Allergies as of 08/11/2017   No Known Allergies     Medication List    TAKE these medications   amLODipine 5 MG tablet Commonly known as:  NORVASC Take 5 mgs by mouth once daily in the evening What changed:    how much to take  how to take this  when to take this  additional instructions   atorvastatin 20 MG tablet Commonly known as:  LIPITOR Take 1 tablet (20 mg total) by mouth every evening.   diphenhydrAMINE 25 MG tablet Commonly known as:  BENADRYL Take 25 mg by mouth daily as needed for allergies.   docusate sodium 100 MG capsule Commonly known as:  COLACE Take 1 capsule (100 mg total) by mouth 2 (two) times daily.   febuxostat 40 MG tablet Commonly known as:  ULORIC Take 40 mg by mouth daily.   folic acid 1 MG tablet Commonly known as:  FOLVITE Take  1 mg by mouth daily.   hydroxychloroquine 200 MG tablet Commonly known as:  PLAQUENIL Take 200 mg by mouth 2 (two) times daily.   KLOR-CON M20 20 MEQ tablet Generic drug:  potassium chloride SA TAKE 1/2 TABLET (=10MEQ)   TWO TIMES A DAY ; MAY TAKE AND EXTRA TABLET WHEN      TAKING TORSEMIDE   leflunomide 20 MG tablet Commonly known as:  ARAVA Take 20 mg by mouth every morning.   metoprolol succinate 50 MG 24 hr tablet Commonly known as:  TOPROL-XL Take 1 tablet (50 mg total) by mouth every evening. Take with or immediately following a meal. What changed:  when to take this   montelukast 10 MG  tablet Commonly known as:  SINGULAIR Take 1 tablet (10 mg total) by mouth at bedtime.   multivitamin tablet Take 1 tablet by mouth daily.   Oxycodone HCl 10 MG Tabs Commonly known as:  ROXICODONE Take 1 tablet (10 mg total) by mouth every 4 (four) hours as needed for moderate pain. Take 2 tablets by mouth every 12 hours   polyethylene glycol packet Commonly known as:  MIRALAX / GLYCOLAX Take 17 g by mouth daily as needed for mild constipation.   PRADAXA 150 MG Caps capsule Generic drug:  dabigatran TAKE 1 CAPSULE TWICE DAILY   pregabalin 150 MG capsule Commonly known as:  LYRICA Take 150 mg by mouth 3 (three) times daily.   SF 5000 PLUS 1.1 % Crea dental cream Generic drug:  sodium fluoride Place 1 application onto teeth 2 (two) times daily.   sodium chloride 0.65 % Soln nasal spray Commonly known as:  OCEAN Place 1 spray into both nostrils as needed for congestion.   sulfamethoxazole-trimethoprim 800-160 MG tablet Commonly known as:  BACTRIM DS,SEPTRA DS Take 1 tablet by mouth every 12 (twelve) hours for 14 days.   terazosin 10 MG capsule Commonly known as:  HYTRIN TAKE 1 CAPSULE BY MOUTH AT BEDTIME   tolterodine 2 MG 24 hr capsule Commonly known as:  DETROL LA Take 1 capsule (2 mg total) by mouth daily as needed (bladder spasms).   torsemide 20 MG tablet Commonly known as:  DEMADEX Take 20 mg by mouth twice daily as needed for swelling         Today   CHIEF COMPLAINT:  Patient doing well this morning.  No acute events.   VITAL SIGNS:  Blood pressure 131/63, pulse (!) 55, temperature 98.3 F (36.8 C), temperature source Oral, resp. rate 20, height 5\' 3"  (1.6 m), weight 130.2 kg (287 lb), SpO2 95 %.   REVIEW OF SYSTEMS:  Review of Systems  Constitutional: Negative.  Negative for chills, fever and malaise/fatigue.  HENT: Negative.  Negative for ear discharge, ear pain, hearing loss, nosebleeds and sore throat.   Eyes: Negative.  Negative for blurred  vision and pain.  Respiratory: Negative.  Negative for cough, hemoptysis, shortness of breath and wheezing.   Cardiovascular: Negative.  Negative for chest pain, palpitations and leg swelling.  Gastrointestinal: Negative.  Negative for abdominal pain, blood in stool, diarrhea, nausea and vomiting.  Genitourinary: Negative.  Negative for dysuria.  Musculoskeletal: Negative.  Negative for back pain.  Skin: Negative.   Neurological: Negative for dizziness, tremors, speech change, focal weakness, seizures and headaches.  Endo/Heme/Allergies: Negative.  Does not bruise/bleed easily.  Psychiatric/Behavioral: Negative.  Negative for depression, hallucinations and suicidal ideas.     PHYSICAL EXAMINATION:  GENERAL:  67 y.o.-year-old patient lying in the bed with  no acute distress.  NECK:  Supple, no jugular venous distention. No thyroid enlargement, no tenderness.  LUNGS: Normal breath sounds bilaterally, no wheezing, rales,rhonchi  No use of accessory muscles of respiration.  CARDIOVASCULAR: S1, S2 normal. No murmurs, rubs, or gallops.  ABDOMEN: Soft, non-tender, non-distended. Bowel sounds present. No organomegaly or mass.  EXTREMITIES: No pedal edema, cyanosis, or clubbing.  PSYCHIATRIC: The patient is alert and oriented x 3.  SKIN: No obvious rash, lesion, or ulcer.  Wound VAC placed right foot DATA REVIEW:   CBC Recent Labs  Lab 08/10/17 0602  WBC 3.8  HGB 10.8*  HCT 32.9*  PLT 146*    Chemistries  Recent Labs  Lab 08/08/17 1216  08/11/17 0522  NA 140   < > 139  K 3.6   < > 3.6  CL 104   < > 110  CO2 28   < > 24  GLUCOSE 93   < > 94  BUN 21*   < > 18  CREATININE 1.11*   < > 0.74  CALCIUM 9.5   < > 9.0  AST 23  --   --   ALT 11*  --   --   ALKPHOS 87  --   --   BILITOT 1.0  --   --    < > = values in this interval not displayed.    Cardiac Enzymes No results for input(s): TROPONINI in the last 168 hours.  Microbiology Results  @MICRORSLT48 @  RADIOLOGY:  No  results found.    Allergies as of 08/11/2017   No Known Allergies     Medication List    TAKE these medications   amLODipine 5 MG tablet Commonly known as:  NORVASC Take 5 mgs by mouth once daily in the evening What changed:    how much to take  how to take this  when to take this  additional instructions   atorvastatin 20 MG tablet Commonly known as:  LIPITOR Take 1 tablet (20 mg total) by mouth every evening.   diphenhydrAMINE 25 MG tablet Commonly known as:  BENADRYL Take 25 mg by mouth daily as needed for allergies.   docusate sodium 100 MG capsule Commonly known as:  COLACE Take 1 capsule (100 mg total) by mouth 2 (two) times daily.   febuxostat 40 MG tablet Commonly known as:  ULORIC Take 40 mg by mouth daily.   folic acid 1 MG tablet Commonly known as:  FOLVITE Take 1 mg by mouth daily.   hydroxychloroquine 200 MG tablet Commonly known as:  PLAQUENIL Take 200 mg by mouth 2 (two) times daily.   KLOR-CON M20 20 MEQ tablet Generic drug:  potassium chloride SA TAKE 1/2 TABLET (=10MEQ)   TWO TIMES A DAY ; MAY TAKE AND EXTRA TABLET WHEN      TAKING TORSEMIDE   leflunomide 20 MG tablet Commonly known as:  ARAVA Take 20 mg by mouth every morning.   metoprolol succinate 50 MG 24 hr tablet Commonly known as:  TOPROL-XL Take 1 tablet (50 mg total) by mouth every evening. Take with or immediately following a meal. What changed:  when to take this   montelukast 10 MG tablet Commonly known as:  SINGULAIR Take 1 tablet (10 mg total) by mouth at bedtime.   multivitamin tablet Take 1 tablet by mouth daily.   Oxycodone HCl 10 MG Tabs Commonly known as:  ROXICODONE Take 1 tablet (10 mg total) by mouth every 4 (four) hours as needed  for moderate pain. Take 2 tablets by mouth every 12 hours   polyethylene glycol packet Commonly known as:  MIRALAX / GLYCOLAX Take 17 g by mouth daily as needed for mild constipation.   PRADAXA 150 MG Caps capsule Generic  drug:  dabigatran TAKE 1 CAPSULE TWICE DAILY   pregabalin 150 MG capsule Commonly known as:  LYRICA Take 150 mg by mouth 3 (three) times daily.   SF 5000 PLUS 1.1 % Crea dental cream Generic drug:  sodium fluoride Place 1 application onto teeth 2 (two) times daily.   sodium chloride 0.65 % Soln nasal spray Commonly known as:  OCEAN Place 1 spray into both nostrils as needed for congestion.   sulfamethoxazole-trimethoprim 800-160 MG tablet Commonly known as:  BACTRIM DS,SEPTRA DS Take 1 tablet by mouth every 12 (twelve) hours for 14 days.   terazosin 10 MG capsule Commonly known as:  HYTRIN TAKE 1 CAPSULE BY MOUTH AT BEDTIME   tolterodine 2 MG 24 hr capsule Commonly known as:  DETROL LA Take 1 capsule (2 mg total) by mouth daily as needed (bladder spasms).   torsemide 20 MG tablet Commonly known as:  DEMADEX Take 20 mg by mouth twice daily as needed for swelling         Management plans discussed with the patient and she is in agreement. Stable for discharge snf  Patient should follow up with pcp/podiatry  CODE STATUS:     Code Status Orders  (From admission, onward)        Start     Ordered   08/08/17 1132  Full code  Continuous     08/08/17 1132    Code Status History    Date Active Date Inactive Code Status Order ID Comments User Context   07/13/2017 1315 07/16/2017 2126 Full Code [id number]  Gorden Harms, MD Inpatient   08/10/2014 0200 08/11/2014 1605 Full Code [id number]  Lance Coon, MD Inpatient      TOTAL TIME TAKING CARE OF THIS PATIENT: 38 minutes.    Note: This dictation was prepared with Dragon dictation along with smaller phrase technology. Any transcriptional errors that result from this process are unintentional.  Anasophia Pecor M.D on 08/11/2017 at 10:02 AM  Between 7am to 6pm - Pager - [phone] After 6pm go to [email or web address] - password [email or web address] [facility]  Office  [phone]  CC: Primary care physician;  Venia Carbon, MD

## 2017-08-11 NOTE — Clinical Social Work Note (Signed)
CSW has spoken with Linus Orn from [facility] who will call back with discharge instructions. The patient may discharge with a wound vac; however, wound care is pending per the attending RN. CSW will continue to facilitate discharge as able.  Santiago Bumpers, MSW, Latanya Presser [phone]

## 2017-08-11 NOTE — Clinical Social Work Note (Addendum)
CSW contacted [employer] [phone]) to order a home wound vac to be delivered today to [facility] as authorized by the admissions coordinator, Linus Orn. The wound vac (Vac Simplicity) reference # for delivery is [id number].   The patient will discharge to [facility] today via non-emergent EMS. The patient and facility are aware and in agreement. The CSW has delivered the discharge paperwork. The CSW is signing off. Please consult should additional needs arise.  Santiago Bumpers, MSW, Latanya Presser [phone]

## 2017-08-11 NOTE — Progress Notes (Signed)
Pt s/p debridment and wound vac placement.  Wound vac changed by myself.  Good granular tissue to 2 separate wounds both approx 3-4 cm in diameter.  No purulence or signs of infection.  Should remain NWB and c/w wound vac 3 x's per week  Wound vac dressings:  159mm hg continuous, changed 3 x's per week.  F/u in 1-2 weeks.

## 2017-08-11 NOTE — Discharge Summary (Signed)
[facility] at [place] NAME: Tina Pacheco    MR#:  [id number]  DATE OF BIRTH:  1950-07-11  DATE OF ADMISSION:  08/08/2017 ADMITTING PHYSICIAN: Gladstone Lighter, MD  DATE OF DISCHARGE: August 11, 2017  PRIMARY CARE PHYSICIAN: Venia Carbon, MD    ADMISSION DIAGNOSIS:  infected surg sight IND  DISCHARGE DIAGNOSIS:  Active Problems: MSSA right foot infection  SECONDARY DIAGNOSIS:   Past Medical History:  Diagnosis Date  . Arthritis    RA  . Asthma   . Basal cell carcinoma   . Cataract 2018   bilateral eyes; corrected with surgery  . Claustrophobia   . COPD (chronic obstructive pulmonary disease) ([place])   . Diastolic dysfunction    a. echo 07/2014: EF 55-60%, no RWMA, GR2DD, mild MR, LA moderately dilated, PASP 38 mm Hg  . Dyslipidemia   . Dysrhythmia   . Headache    migraines  . Hemihypertrophy   . History of cardiac cath    a. 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 History of gout   . Hyperplastic colonic polyp 2003  . Hypertension   . Hypokalemia   . Morbid obesity ([place])   . PAF (paroxysmal atrial fibrillation) ([facility])    a. on Pradaxa; b. CHADSVASc at least 2 (HTN & female)  . Rheumatoid arthritis(714.0)   . Sleep apnea    a. not compliant with CPAP    HOSPITAL COURSE:  67 year old female with history of rheumatoid arthritis, PAF on anticoagulation and chronic diastolic heart failure who presented to the emergency room from the podiatry office due to wound dehiscence for chronic nonhealing right foot fracture status post extensive surgery.  1.    Pansensitive Staphylococcus aureus right foot infection from wound dehiscence: Patient is postoperative day #2 debridement and  wound VAC placement  Patient will need wound VAC change every 3 days with 125 mL continous.  Wound VAC was changed on the day of discharge.  Patient will follow-up with Dr. Elvina Mattes in 1 week. Weightbearing as tolerated however not on the right  foot  2.  Rheumatoid arthritis: Continue Arava and Plaquenil  3.  Essential hypertension: Continue Norvasc and metoprolol  4.  Chronic diastolic heart failure without signs of exacerbation Continue Demadex  5.  PAF: Continue metoprolol for heart rate control Continue Pradaxa  6.  Hyperlipidemia: Continue statin 7.  Constipation: Resolved    DISCHARGE CONDITIONS AND DIET:  Regular diet stable for discharge  CONSULTS OBTAINED:    DRUG ALLERGIES:  No Known Allergies  DISCHARGE MEDICATIONS:   Allergies as of 08/11/2017   No Known Allergies     Medication List    TAKE these medications   amLODipine 5 MG tablet Commonly known as:  NORVASC Take 5 mgs by mouth once daily in the evening What changed:    how much to take  how to take this  when to take this  additional instructions   atorvastatin 20 MG tablet Commonly known as:  LIPITOR Take 1 tablet (20 mg total) by mouth every evening.   diphenhydrAMINE 25 MG tablet Commonly known as:  BENADRYL Take 25 mg by mouth daily as needed for allergies.   docusate sodium 100 MG capsule Commonly known as:  COLACE Take 1 capsule (100 mg total) by mouth 2 (two) times daily.   febuxostat 40 MG tablet Commonly known as:  ULORIC Take 40 mg by mouth daily.   folic acid 1 MG tablet Commonly known  as:  FOLVITE Take 1 mg by mouth daily.   hydroxychloroquine 200 MG tablet Commonly known as:  PLAQUENIL Take 200 mg by mouth 2 (two) times daily.   KLOR-CON M20 20 MEQ tablet Generic drug:  potassium chloride SA TAKE 1/2 TABLET (=10MEQ)   TWO TIMES A DAY ; MAY TAKE AND EXTRA TABLET WHEN      TAKING TORSEMIDE   leflunomide 20 MG tablet Commonly known as:  ARAVA Take 20 mg by mouth every morning.   metoprolol succinate 50 MG 24 hr tablet Commonly known as:  TOPROL-XL Take 1 tablet (50 mg total) by mouth every evening. Take with or immediately following a meal. What changed:  when to take this   montelukast 10 MG  tablet Commonly known as:  SINGULAIR Take 1 tablet (10 mg total) by mouth at bedtime.   multivitamin tablet Take 1 tablet by mouth daily.   Oxycodone HCl 10 MG Tabs Commonly known as:  ROXICODONE Take 1 tablet (10 mg total) by mouth every 4 (four) hours as needed for moderate pain. Take 2 tablets by mouth every 12 hours   polyethylene glycol packet Commonly known as:  MIRALAX / GLYCOLAX Take 17 g by mouth daily as needed for mild constipation.   PRADAXA 150 MG Caps capsule Generic drug:  dabigatran TAKE 1 CAPSULE TWICE DAILY   pregabalin 150 MG capsule Commonly known as:  LYRICA Take 150 mg by mouth 3 (three) times daily.   SF 5000 PLUS 1.1 % Crea dental cream Generic drug:  sodium fluoride Place 1 application onto teeth 2 (two) times daily.   sodium chloride 0.65 % Soln nasal spray Commonly known as:  OCEAN Place 1 spray into both nostrils as needed for congestion.   sulfamethoxazole-trimethoprim 800-160 MG tablet Commonly known as:  BACTRIM DS,SEPTRA DS Take 1 tablet by mouth every 12 (twelve) hours for 14 days.   terazosin 10 MG capsule Commonly known as:  HYTRIN TAKE 1 CAPSULE BY MOUTH AT BEDTIME   tolterodine 2 MG 24 hr capsule Commonly known as:  DETROL LA Take 1 capsule (2 mg total) by mouth daily as needed (bladder spasms).   torsemide 20 MG tablet Commonly known as:  DEMADEX Take 20 mg by mouth twice daily as needed for swelling         Today   CHIEF COMPLAINT:  Patient doing well this morning.  No acute events.   VITAL SIGNS:  Blood pressure 131/63, pulse (!) 55, temperature 98.3 F (36.8 C), temperature source Oral, resp. rate 20, height 5\' 3"  (1.6 m), weight 130.2 kg (287 lb), SpO2 95 %.   REVIEW OF SYSTEMS:  Review of Systems  Constitutional: Negative.  Negative for chills, fever and malaise/fatigue.  HENT: Negative.  Negative for ear discharge, ear pain, hearing loss, nosebleeds and sore throat.   Eyes: Negative.  Negative for blurred  vision and pain.  Respiratory: Negative.  Negative for cough, hemoptysis, shortness of breath and wheezing.   Cardiovascular: Negative.  Negative for chest pain, palpitations and leg swelling.  Gastrointestinal: Negative.  Negative for abdominal pain, blood in stool, diarrhea, nausea and vomiting.  Genitourinary: Negative.  Negative for dysuria.  Musculoskeletal: Negative.  Negative for back pain.  Skin: Negative.   Neurological: Negative for dizziness, tremors, speech change, focal weakness, seizures and headaches.  Endo/Heme/Allergies: Negative.  Does not bruise/bleed easily.  Psychiatric/Behavioral: Negative.  Negative for depression, hallucinations and suicidal ideas.     PHYSICAL EXAMINATION:  GENERAL:  67 y.o.-year-old patient lying  in the bed with no acute distress.  NECK:  Supple, no jugular venous distention. No thyroid enlargement, no tenderness.  LUNGS: Normal breath sounds bilaterally, no wheezing, rales,rhonchi  No use of accessory muscles of respiration.  CARDIOVASCULAR: S1, S2 normal. No murmurs, rubs, or gallops.  ABDOMEN: Soft, non-tender, non-distended. Bowel sounds present. No organomegaly or mass.  EXTREMITIES: No pedal edema, cyanosis, or clubbing.  PSYCHIATRIC: The patient is alert and oriented x 3.  SKIN: No obvious rash, lesion, or ulcer.  Wound VAC placed right foot DATA REVIEW:   CBC Recent Labs  Lab 08/10/17 0602  WBC 3.8  HGB 10.8*  HCT 32.9*  PLT 146*    Chemistries  Recent Labs  Lab 08/08/17 1216  08/11/17 0522  NA 140   < > 139  K 3.6   < > 3.6  CL 104   < > 110  CO2 28   < > 24  GLUCOSE 93   < > 94  BUN 21*   < > 18  CREATININE 1.11*   < > 0.74  CALCIUM 9.5   < > 9.0  AST 23  --   --   ALT 11*  --   --   ALKPHOS 87  --   --   BILITOT 1.0  --   --    < > = values in this interval not displayed.    Cardiac Enzymes No results for input(s): TROPONINI in the last 168 hours.  Microbiology Results  @MICRORSLT48 @  RADIOLOGY:  No  results found.    Allergies as of 08/11/2017   No Known Allergies     Medication List    TAKE these medications   amLODipine 5 MG tablet Commonly known as:  NORVASC Take 5 mgs by mouth once daily in the evening What changed:    how much to take  how to take this  when to take this  additional instructions   atorvastatin 20 MG tablet Commonly known as:  LIPITOR Take 1 tablet (20 mg total) by mouth every evening.   diphenhydrAMINE 25 MG tablet Commonly known as:  BENADRYL Take 25 mg by mouth daily as needed for allergies.   docusate sodium 100 MG capsule Commonly known as:  COLACE Take 1 capsule (100 mg total) by mouth 2 (two) times daily.   febuxostat 40 MG tablet Commonly known as:  ULORIC Take 40 mg by mouth daily.   folic acid 1 MG tablet Commonly known as:  FOLVITE Take 1 mg by mouth daily.   hydroxychloroquine 200 MG tablet Commonly known as:  PLAQUENIL Take 200 mg by mouth 2 (two) times daily.   KLOR-CON M20 20 MEQ tablet Generic drug:  potassium chloride SA TAKE 1/2 TABLET (=10MEQ)   TWO TIMES A DAY ; MAY TAKE AND EXTRA TABLET WHEN      TAKING TORSEMIDE   leflunomide 20 MG tablet Commonly known as:  ARAVA Take 20 mg by mouth every morning.   metoprolol succinate 50 MG 24 hr tablet Commonly known as:  TOPROL-XL Take 1 tablet (50 mg total) by mouth every evening. Take with or immediately following a meal. What changed:  when to take this   montelukast 10 MG tablet Commonly known as:  SINGULAIR Take 1 tablet (10 mg total) by mouth at bedtime.   multivitamin tablet Take 1 tablet by mouth daily.   Oxycodone HCl 10 MG Tabs Commonly known as:  ROXICODONE Take 1 tablet (10 mg total) by mouth every 4 (  four) hours as needed for moderate pain. Take 2 tablets by mouth every 12 hours   polyethylene glycol packet Commonly known as:  MIRALAX / GLYCOLAX Take 17 g by mouth daily as needed for mild constipation.   PRADAXA 150 MG Caps capsule Generic  drug:  dabigatran TAKE 1 CAPSULE TWICE DAILY   pregabalin 150 MG capsule Commonly known as:  LYRICA Take 150 mg by mouth 3 (three) times daily.   SF 5000 PLUS 1.1 % Crea dental cream Generic drug:  sodium fluoride Place 1 application onto teeth 2 (two) times daily.   sodium chloride 0.65 % Soln nasal spray Commonly known as:  OCEAN Place 1 spray into both nostrils as needed for congestion.   sulfamethoxazole-trimethoprim 800-160 MG tablet Commonly known as:  BACTRIM DS,SEPTRA DS Take 1 tablet by mouth every 12 (twelve) hours for 14 days.   terazosin 10 MG capsule Commonly known as:  HYTRIN TAKE 1 CAPSULE BY MOUTH AT BEDTIME   tolterodine 2 MG 24 hr capsule Commonly known as:  DETROL LA Take 1 capsule (2 mg total) by mouth daily as needed (bladder spasms).   torsemide 20 MG tablet Commonly known as:  DEMADEX Take 20 mg by mouth twice daily as needed for swelling         Management plans discussed with the patient and she is in agreement. Stable for discharge snf  Patient should follow up with pcp/podiatry  CODE STATUS:     Code Status Orders  (From admission, onward)        Start     Ordered   08/08/17 1132  Full code  Continuous     08/08/17 1132    Code Status History    Date Active Date Inactive Code Status Order ID Comments User Context   07/13/2017 1315 07/16/2017 2126 Full Code [id number]  Gorden Harms, MD Inpatient   08/10/2014 0200 08/11/2014 1605 Full Code [id number]  Lance Coon, MD Inpatient      TOTAL TIME TAKING CARE OF THIS PATIENT: 38 minutes.    Note: This dictation was prepared with Dragon dictation along with smaller phrase technology. Any transcriptional errors that result from this process are unintentional.  Aveline Daus M.D on 08/11/2017 at 10:50 AM  Between 7am to 6pm - Pager - [phone] After 6pm go to [email or web address] - password [email or web address] [facility]  Office  [phone]  CC: Primary care physician;  Venia Carbon, MD

## 2017-08-11 NOTE — Progress Notes (Signed)
Patient is discharged to [facility]. IV removed with cath intact. Wound vac changed by MD this shift. Disconnected before discharge. Report called. Script and paperwork send with patient via EMS.

## 2017-08-13 ENCOUNTER — Telehealth: Payer: Self-pay | Admitting: Internal Medicine

## 2017-08-13 ENCOUNTER — Telehealth: Payer: Self-pay

## 2017-08-13 DIAGNOSIS — M25571 Pain in right ankle and joints of right foot: Secondary | ICD-10-CM | POA: Diagnosis not present

## 2017-08-13 DIAGNOSIS — R2689 Other abnormalities of gait and mobility: Secondary | ICD-10-CM | POA: Diagnosis not present

## 2017-08-13 MED ORDER — TOLTERODINE TARTRATE ER 2 MG PO CP24: 2.0000 mg | ORAL_CAPSULE | Freq: Every day | ORAL | 5 refills | Status: DC | PRN

## 2017-08-13 NOTE — Telephone Encounter (Addendum)
Dr Silvio Pate had sent me a note to see how the pt was doing. According to the person that answered the phone at her house, she is in the hospital. I am not sure if she is in a Rehab.

## 2017-08-13 NOTE — Telephone Encounter (Signed)
Pt spouse, [name], came in said pt is at [facility] and will be there for another week. He scheduled HFU 6/28.

## 2017-08-13 NOTE — Telephone Encounter (Signed)
Rx sent electronically.  

## 2017-08-13 NOTE — Telephone Encounter (Signed)
Copied from [place] [id number]. Topic: Quick Communication - Rx Refill/Question >> Aug 13, 2017 11:27 AM Nathanial Millman J wrote: Medication: tolterodine (DETROL LA) 2 MG 24 hr capsule  Has the patient contacted their pharmacy? Yes.   (Agent: If no, request that the patient contact the pharmacy for the refill.) (Agent: If yes, when and what did the pharmacy advise?)  Preferred Pharmacy (with phone number or street name): [id number] [facility]  Cell is if any questions is [phone] Sherian Rein does not have the rx according to pt      Agent: Please be advised that RX refills may take up to 3 business days. We ask that you follow-up with your pharmacy.

## 2017-08-14 LAB — AEROBIC/ANAEROBIC CULTURE W GRAM STAIN (SURGICAL/DEEP WOUND)

## 2017-08-14 LAB — AEROBIC/ANAEROBIC CULTURE (SURGICAL/DEEP WOUND)

## 2017-08-15 DIAGNOSIS — M25512 Pain in left shoulder: Secondary | ICD-10-CM | POA: Diagnosis not present

## 2017-08-15 DIAGNOSIS — M25571 Pain in right ankle and joints of right foot: Secondary | ICD-10-CM | POA: Diagnosis not present

## 2017-08-15 DIAGNOSIS — R2689 Other abnormalities of gait and mobility: Secondary | ICD-10-CM | POA: Diagnosis not present

## 2017-08-17 DIAGNOSIS — R2689 Other abnormalities of gait and mobility: Secondary | ICD-10-CM | POA: Diagnosis not present

## 2017-08-17 DIAGNOSIS — M25571 Pain in right ankle and joints of right foot: Secondary | ICD-10-CM | POA: Diagnosis not present

## 2017-08-17 DIAGNOSIS — I10 Essential (primary) hypertension: Secondary | ICD-10-CM | POA: Diagnosis not present

## 2017-08-17 DIAGNOSIS — M1990 Unspecified osteoarthritis, unspecified site: Secondary | ICD-10-CM | POA: Diagnosis not present

## 2017-08-17 DIAGNOSIS — Z9889 Other specified postprocedural states: Secondary | ICD-10-CM | POA: Diagnosis not present

## 2017-08-17 DIAGNOSIS — M069 Rheumatoid arthritis, unspecified: Secondary | ICD-10-CM | POA: Diagnosis not present

## 2017-08-20 DIAGNOSIS — M069 Rheumatoid arthritis, unspecified: Secondary | ICD-10-CM | POA: Diagnosis not present

## 2017-08-20 DIAGNOSIS — J45909 Unspecified asthma, uncomplicated: Secondary | ICD-10-CM | POA: Diagnosis not present

## 2017-08-20 DIAGNOSIS — G4730 Sleep apnea, unspecified: Secondary | ICD-10-CM | POA: Diagnosis not present

## 2017-08-20 DIAGNOSIS — S92901G Unspecified fracture of right foot, subsequent encounter for fracture with delayed healing: Secondary | ICD-10-CM | POA: Diagnosis not present

## 2017-08-20 DIAGNOSIS — I110 Hypertensive heart disease with heart failure: Secondary | ICD-10-CM | POA: Diagnosis not present

## 2017-08-20 DIAGNOSIS — I480 Paroxysmal atrial fibrillation: Secondary | ICD-10-CM | POA: Diagnosis not present

## 2017-08-20 DIAGNOSIS — N3281 Overactive bladder: Secondary | ICD-10-CM | POA: Diagnosis not present

## 2017-08-20 DIAGNOSIS — F4541 Pain disorder exclusively related to psychological factors: Secondary | ICD-10-CM | POA: Diagnosis not present

## 2017-08-20 DIAGNOSIS — Z4801 Encounter for change or removal of surgical wound dressing: Secondary | ICD-10-CM | POA: Diagnosis not present

## 2017-08-20 DIAGNOSIS — B9561 Methicillin susceptible Staphylococcus aureus infection as the cause of diseases classified elsewhere: Secondary | ICD-10-CM | POA: Diagnosis not present

## 2017-08-20 DIAGNOSIS — H25813 Combined forms of age-related cataract, bilateral: Secondary | ICD-10-CM | POA: Diagnosis not present

## 2017-08-20 DIAGNOSIS — M6281 Muscle weakness (generalized): Secondary | ICD-10-CM | POA: Diagnosis not present

## 2017-08-20 DIAGNOSIS — Z79899 Other long term (current) drug therapy: Secondary | ICD-10-CM | POA: Diagnosis not present

## 2017-08-20 DIAGNOSIS — T8140XD Infection following a procedure, unspecified, subsequent encounter: Secondary | ICD-10-CM | POA: Diagnosis not present

## 2017-08-20 DIAGNOSIS — I5032 Chronic diastolic (congestive) heart failure: Secondary | ICD-10-CM | POA: Diagnosis not present

## 2017-08-20 DIAGNOSIS — J449 Chronic obstructive pulmonary disease, unspecified: Secondary | ICD-10-CM | POA: Diagnosis not present

## 2017-08-20 DIAGNOSIS — Z79891 Long term (current) use of opiate analgesic: Secondary | ICD-10-CM | POA: Diagnosis not present

## 2017-08-20 DIAGNOSIS — E785 Hyperlipidemia, unspecified: Secondary | ICD-10-CM | POA: Diagnosis not present

## 2017-08-21 DIAGNOSIS — T8140XD Infection following a procedure, unspecified, subsequent encounter: Secondary | ICD-10-CM | POA: Diagnosis not present

## 2017-08-21 DIAGNOSIS — B9561 Methicillin susceptible Staphylococcus aureus infection as the cause of diseases classified elsewhere: Secondary | ICD-10-CM | POA: Diagnosis not present

## 2017-08-21 DIAGNOSIS — S92901G Unspecified fracture of right foot, subsequent encounter for fracture with delayed healing: Secondary | ICD-10-CM | POA: Diagnosis not present

## 2017-08-21 DIAGNOSIS — J449 Chronic obstructive pulmonary disease, unspecified: Secondary | ICD-10-CM | POA: Diagnosis not present

## 2017-08-21 DIAGNOSIS — I110 Hypertensive heart disease with heart failure: Secondary | ICD-10-CM | POA: Diagnosis not present

## 2017-08-21 DIAGNOSIS — M069 Rheumatoid arthritis, unspecified: Secondary | ICD-10-CM | POA: Diagnosis not present

## 2017-08-22 DIAGNOSIS — S92901G Unspecified fracture of right foot, subsequent encounter for fracture with delayed healing: Secondary | ICD-10-CM | POA: Diagnosis not present

## 2017-08-22 DIAGNOSIS — I110 Hypertensive heart disease with heart failure: Secondary | ICD-10-CM | POA: Diagnosis not present

## 2017-08-22 DIAGNOSIS — J449 Chronic obstructive pulmonary disease, unspecified: Secondary | ICD-10-CM | POA: Diagnosis not present

## 2017-08-22 DIAGNOSIS — B9561 Methicillin susceptible Staphylococcus aureus infection as the cause of diseases classified elsewhere: Secondary | ICD-10-CM | POA: Diagnosis not present

## 2017-08-22 DIAGNOSIS — M069 Rheumatoid arthritis, unspecified: Secondary | ICD-10-CM | POA: Diagnosis not present

## 2017-08-22 DIAGNOSIS — T8140XD Infection following a procedure, unspecified, subsequent encounter: Secondary | ICD-10-CM | POA: Diagnosis not present

## 2017-08-23 DIAGNOSIS — T8131XA Disruption of external operation (surgical) wound, not elsewhere classified, initial encounter: Secondary | ICD-10-CM | POA: Diagnosis not present

## 2017-08-24 ENCOUNTER — Encounter: Payer: Self-pay | Admitting: Internal Medicine

## 2017-08-24 ENCOUNTER — Ambulatory Visit (INDEPENDENT_AMBULATORY_CARE_PROVIDER_SITE_OTHER): Payer: Medicare Other | Admitting: Internal Medicine

## 2017-08-24 VITALS — BP 110/76 | HR 53 | Temp 97.9°F | Ht 65.0 in

## 2017-08-24 DIAGNOSIS — I110 Hypertensive heart disease with heart failure: Secondary | ICD-10-CM | POA: Diagnosis not present

## 2017-08-24 DIAGNOSIS — G894 Chronic pain syndrome: Secondary | ICD-10-CM

## 2017-08-24 DIAGNOSIS — F1120 Opioid dependence, uncomplicated: Secondary | ICD-10-CM | POA: Diagnosis not present

## 2017-08-24 DIAGNOSIS — J449 Chronic obstructive pulmonary disease, unspecified: Secondary | ICD-10-CM | POA: Diagnosis not present

## 2017-08-24 DIAGNOSIS — I5032 Chronic diastolic (congestive) heart failure: Secondary | ICD-10-CM

## 2017-08-24 DIAGNOSIS — L03115 Cellulitis of right lower limb: Secondary | ICD-10-CM

## 2017-08-24 DIAGNOSIS — M069 Rheumatoid arthritis, unspecified: Secondary | ICD-10-CM | POA: Diagnosis not present

## 2017-08-24 DIAGNOSIS — B9561 Methicillin susceptible Staphylococcus aureus infection as the cause of diseases classified elsewhere: Secondary | ICD-10-CM | POA: Diagnosis not present

## 2017-08-24 DIAGNOSIS — T8140XD Infection following a procedure, unspecified, subsequent encounter: Secondary | ICD-10-CM | POA: Diagnosis not present

## 2017-08-24 DIAGNOSIS — S92901G Unspecified fracture of right foot, subsequent encounter for fracture with delayed healing: Secondary | ICD-10-CM | POA: Diagnosis not present

## 2017-08-24 NOTE — Progress Notes (Signed)
Subjective:    Patient ID: Tina Pacheco, female    DOB: [birth year], 67 y.o.   MRN: [id number]  HPI Here for follow up after hospital and rehab stay  Was admitted with wound dehiscence and MSSA infection Had to have repeat surgery and cleaning of the wound Still using wound vac----being reapplied today Has some visible tendons in wound now Did have follow up with Dr Elvina Mattes yesterday--he told her he was going to put her on 2 weeks of cipro now  [facility] rehab --after original surgery and back after repeat procedure Home for a week Not able to shower Can't get into bathroom---sponge baths via sink, bedside commode Husband is doing all instrumental ADLs Foot pain is better--but no weight bearing  Breathing is okay No increased edema   Using the oxycodone as per past routine Back to 10mg  bid (had been up for a while in hospital and rehab)  Current Outpatient Medications on File Prior to Visit  Medication Sig Dispense Refill  . amLODipine (NORVASC) 5 MG tablet Take 5 mgs by mouth once daily in the evening 90 tablet 3  . atorvastatin (LIPITOR) 20 MG tablet Take 1 tablet (20 mg total) by mouth every evening.    . diphenhydrAMINE (BENADRYL) 25 MG tablet Take 25 mg by mouth daily as needed for allergies.    . febuxostat (ULORIC) 40 MG tablet Take 40 mg by mouth daily.    . folic acid (FOLVITE) 1 MG tablet Take 1 mg by mouth daily.    . hydroxychloroquine (PLAQUENIL) 200 MG tablet Take 200 mg by mouth 2 (two) times daily.     Marland Kitchen KLOR-CON M20 20 MEQ tablet TAKE 1/2 TABLET (=10MEQ)   TWO TIMES A DAY ; MAY TAKE AND EXTRA TABLET WHEN      TAKING TORSEMIDE 180 tablet 3  . leflunomide (ARAVA) 20 MG tablet Take 20 mg by mouth every morning.     . metoprolol succinate (TOPROL-XL) 50 MG 24 hr tablet Take 1 tablet (50 mg total) by mouth every evening. Take with or immediately following a meal.    . montelukast (SINGULAIR) 10 MG tablet Take 1 tablet (10 mg total) by mouth at bedtime. 90  tablet 3  . Multiple Vitamin (MULTIVITAMIN) tablet Take 1 tablet by mouth daily.     . Oxycodone HCl 10 MG TABS Take 1 tablet (10 mg total) by mouth every 4 (four) hours as needed for moderate pain. Take 2 tablets by mouth every 12 hours 30 tablet 0  . polyethylene glycol (MIRALAX / GLYCOLAX) packet Take 17 g by mouth daily as needed for mild constipation. 14 each 0  . PRADAXA 150 MG CAPS capsule TAKE 1 CAPSULE TWICE DAILY 180 capsule 3  . pregabalin (LYRICA) 150 MG capsule Take 150 mg by mouth 3 (three) times daily.    . SF 5000 PLUS 1.1 % CREA dental cream Place 1 application onto teeth 2 (two) times daily.  1  . sodium chloride (OCEAN) 0.65 % SOLN nasal spray Place 1 spray into both nostrils as needed for congestion.    Marland Kitchen terazosin (HYTRIN) 10 MG capsule TAKE 1 CAPSULE BY MOUTH AT BEDTIME 90 capsule 3  . tolterodine (DETROL LA) 2 MG 24 hr capsule Take 1 capsule (2 mg total) by mouth daily as needed (bladder spasms). 30 capsule 5  . torsemide (DEMADEX) 20 MG tablet Take 20 mg by mouth twice daily as needed for swelling 60 tablet 3  . sulfamethoxazole-trimethoprim (BACTRIM DS,SEPTRA DS)  800-160 MG tablet Take 1 tablet by mouth every 12 (twelve) hours for 14 days. 28 tablet 0   No current facility-administered medications on file prior to visit.     No Known Allergies  Past Medical History:  Diagnosis Date  . Arthritis    RA  . Asthma   . Basal cell carcinoma   . Cataract 2018   bilateral eyes; corrected with surgery  . Claustrophobia   . COPD (chronic obstructive pulmonary disease) ([place])   . Diastolic dysfunction    a. echo 07/2014: EF 55-60%, no RWMA, GR2DD, mild MR, LA moderately dilated, PASP 38 mm Hg  . Dyslipidemia   . Dysrhythmia   . Headache    migraines  . Hemihypertrophy   . History of cardiac cath    a. 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 History of gout   . Hyperplastic colonic polyp 2003  . Hypertension   . Hypokalemia   . Morbid obesity ([place])   . PAF  (paroxysmal atrial fibrillation) (H[facility])    a. on Pradaxa; b. CHADSVASc at least 2 (HTN & female)  . Rheumatoid arthritis(714.0)   . Sleep apnea    a. not compliant with CPAP    Past Surgical History:  Procedure Laterality Date  . ABDOMINAL HYSTERECTOMY    . APPLICATION OF WOUND VAC Right 08/09/2017   Procedure: APPLICATION OF WOUND VAC;  Surgeon: Albertine Patricia, DPM;  Location: [facility] ORS;  Service: Podiatry;  Laterality: Right;  . CARDIAC CATHETERIZATION  05/24/2010   nonobstructive CAD  . CATARACT EXTRACTION W/ INTRAOCULAR LENS IMPLANT Right 06/12/2016   Dr. Darleen Crocker  . CATARACT EXTRACTION W/ INTRAOCULAR LENS IMPLANT Left 06/26/2016   Dr. Darleen Crocker  . CESAREAN SECTION    . CHOLECYSTECTOMY    . COLONOSCOPY  06/12/2011   Procedure: COLONOSCOPY;  Surgeon: Juanita Craver, MD;  Location: [facility];  Service: Endoscopy;  Laterality: N/A;  . COLONOSCOPY N/A 03/17/2013   Procedure: COLONOSCOPY;  Surgeon: Juanita Craver, MD;  Location: [facility];  Service: Endoscopy;  Laterality: N/A;  . EYE SURGERY    . FOOT ARTHRODESIS Right 07/13/2017   Procedure: ARTHRODESIS FOOT-MULTI.FUSIONS (6 JOINTS);  Surgeon: Albertine Patricia, DPM;  Location: [facility] OR[facility];  Service: Podiatry;  Laterality: Right;  . IRRIGATION AND DEBRIDEMENT FOOT Right 08/09/2017   Procedure: IRRIGATION AND DEBRIDEMENT FOOT;  Surgeon: Albertine Patricia, DPM;  Location: [facility];  Service: Podiatry;  Laterality: Right;  . KNEE ARTHROSCOPY     bilateral  . SINUSOTOMY    . TOOTH EXTRACTION  12/2016  . VAGINAL HYSTERECTOMY      Family History  Problem Relation Age of Onset  . Emphysema Father        smoked  . Tuberculosis Mother   . Parkinsonism Mother   . Diabetes type II Sister   . Breast cancer Sister   . Breast cancer Maternal Aunt   . Tuberculosis Sister     Social History   Socioeconomic History  . Marital status: Married    Spouse name: Not on file  . Number of children: 1  . Years of education: Not on file    . Highest education level: Not on file  Occupational History  . 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 Financial resource strain: Not on file  . Food insecurity:    Worry: Not on file    Inability: Not on file  . Transportation needs:  Medical: Not on file    Non-medical: Not on file  Tobacco Use  . Smoking status: Never Smoker  . Smokeless tobacco: Never Used  Substance and Sexual Activity  . Alcohol use: No  . Drug use: No  . Sexual activity: Not on file  Lifestyle  . Physical activity:    Days per week: Not on file    Minutes per session: Not on file  . Stress: Not on file  Relationships  . Social connections:    Talks on phone: Not on file    Gets together: Not on file    Attends religious service: Not on file    Active member of club or organization: Not on file    Attends meetings of clubs or organizations: Not on file    Relationship status: Not on file  . Intimate partner violence:    Fear of current or ex partner: Not on file    Emotionally abused: Not on file    Physically abused: Not on file    Forced sexual activity: Not on file  Other Topics Concern  . Not on file  Social History Narrative   No living will   Husband, then daughter should be decision maker   Would accept resuscitation attempts   Not sure about tube feeds   Review of Systems  Appetite is good Not sleeping great--does have hospital bed at home (nice to move around but mattress not that comfortable)     Objective:   Physical Exam  Constitutional: She appears well-developed. No distress.  Neck: No thyromegaly present.  Cardiovascular: Normal rate, regular rhythm and normal heart sounds. Exam reveals no gallop.  No murmur heard. Respiratory: Effort normal and breath sounds normal. No respiratory distress. She has no wheezes. She has no rales.  Musculoskeletal:  Thick calves without pitting  Lymphadenopathy:    She has no cervical  adenopathy.  Psychiatric: She has a normal mood and affect. Her behavior is normal.           Assessment & Plan:

## 2017-08-24 NOTE — Assessment & Plan Note (Signed)
CSRS reviewed  Nothing of concern

## 2017-08-24 NOTE — Assessment & Plan Note (Signed)
Stable status Briefly on higher oxycodone post op---now back to usual On lyrica as well

## 2017-08-24 NOTE — Assessment & Plan Note (Signed)
Compensated. No changes needed. 

## 2017-08-24 NOTE — Assessment & Plan Note (Signed)
Post op infection requiring surgery again Still with open wound (not examined) Using wound vac Dr Elvina Mattes managing care

## 2017-08-27 DIAGNOSIS — T8140XD Infection following a procedure, unspecified, subsequent encounter: Secondary | ICD-10-CM | POA: Diagnosis not present

## 2017-08-27 DIAGNOSIS — S92901G Unspecified fracture of right foot, subsequent encounter for fracture with delayed healing: Secondary | ICD-10-CM | POA: Diagnosis not present

## 2017-08-27 DIAGNOSIS — M069 Rheumatoid arthritis, unspecified: Secondary | ICD-10-CM | POA: Diagnosis not present

## 2017-08-27 DIAGNOSIS — J449 Chronic obstructive pulmonary disease, unspecified: Secondary | ICD-10-CM | POA: Diagnosis not present

## 2017-08-27 DIAGNOSIS — B9561 Methicillin susceptible Staphylococcus aureus infection as the cause of diseases classified elsewhere: Secondary | ICD-10-CM | POA: Diagnosis not present

## 2017-08-27 DIAGNOSIS — I110 Hypertensive heart disease with heart failure: Secondary | ICD-10-CM | POA: Diagnosis not present

## 2017-08-28 ENCOUNTER — Ambulatory Visit: Payer: Federal, State, Local not specified - PPO | Admitting: Internal Medicine

## 2017-08-28 DIAGNOSIS — J449 Chronic obstructive pulmonary disease, unspecified: Secondary | ICD-10-CM | POA: Diagnosis not present

## 2017-08-28 DIAGNOSIS — T8140XD Infection following a procedure, unspecified, subsequent encounter: Secondary | ICD-10-CM | POA: Diagnosis not present

## 2017-08-28 DIAGNOSIS — M069 Rheumatoid arthritis, unspecified: Secondary | ICD-10-CM | POA: Diagnosis not present

## 2017-08-28 DIAGNOSIS — B9561 Methicillin susceptible Staphylococcus aureus infection as the cause of diseases classified elsewhere: Secondary | ICD-10-CM | POA: Diagnosis not present

## 2017-08-28 DIAGNOSIS — I110 Hypertensive heart disease with heart failure: Secondary | ICD-10-CM | POA: Diagnosis not present

## 2017-08-28 DIAGNOSIS — S92901G Unspecified fracture of right foot, subsequent encounter for fracture with delayed healing: Secondary | ICD-10-CM | POA: Diagnosis not present

## 2017-08-29 DIAGNOSIS — M069 Rheumatoid arthritis, unspecified: Secondary | ICD-10-CM | POA: Diagnosis not present

## 2017-08-29 DIAGNOSIS — I110 Hypertensive heart disease with heart failure: Secondary | ICD-10-CM | POA: Diagnosis not present

## 2017-08-29 DIAGNOSIS — T8140XD Infection following a procedure, unspecified, subsequent encounter: Secondary | ICD-10-CM | POA: Diagnosis not present

## 2017-08-29 DIAGNOSIS — S92901G Unspecified fracture of right foot, subsequent encounter for fracture with delayed healing: Secondary | ICD-10-CM | POA: Diagnosis not present

## 2017-08-29 DIAGNOSIS — B9561 Methicillin susceptible Staphylococcus aureus infection as the cause of diseases classified elsewhere: Secondary | ICD-10-CM | POA: Diagnosis not present

## 2017-08-29 DIAGNOSIS — J449 Chronic obstructive pulmonary disease, unspecified: Secondary | ICD-10-CM | POA: Diagnosis not present

## 2017-08-31 DIAGNOSIS — B9561 Methicillin susceptible Staphylococcus aureus infection as the cause of diseases classified elsewhere: Secondary | ICD-10-CM | POA: Diagnosis not present

## 2017-08-31 DIAGNOSIS — J449 Chronic obstructive pulmonary disease, unspecified: Secondary | ICD-10-CM | POA: Diagnosis not present

## 2017-08-31 DIAGNOSIS — I110 Hypertensive heart disease with heart failure: Secondary | ICD-10-CM | POA: Diagnosis not present

## 2017-08-31 DIAGNOSIS — M069 Rheumatoid arthritis, unspecified: Secondary | ICD-10-CM | POA: Diagnosis not present

## 2017-08-31 DIAGNOSIS — T8140XD Infection following a procedure, unspecified, subsequent encounter: Secondary | ICD-10-CM | POA: Diagnosis not present

## 2017-08-31 DIAGNOSIS — S92901G Unspecified fracture of right foot, subsequent encounter for fracture with delayed healing: Secondary | ICD-10-CM | POA: Diagnosis not present

## 2017-09-03 DIAGNOSIS — T8140XD Infection following a procedure, unspecified, subsequent encounter: Secondary | ICD-10-CM | POA: Diagnosis not present

## 2017-09-03 DIAGNOSIS — B9561 Methicillin susceptible Staphylococcus aureus infection as the cause of diseases classified elsewhere: Secondary | ICD-10-CM | POA: Diagnosis not present

## 2017-09-03 DIAGNOSIS — I110 Hypertensive heart disease with heart failure: Secondary | ICD-10-CM | POA: Diagnosis not present

## 2017-09-03 DIAGNOSIS — S92901G Unspecified fracture of right foot, subsequent encounter for fracture with delayed healing: Secondary | ICD-10-CM | POA: Diagnosis not present

## 2017-09-03 DIAGNOSIS — J449 Chronic obstructive pulmonary disease, unspecified: Secondary | ICD-10-CM | POA: Diagnosis not present

## 2017-09-03 DIAGNOSIS — M069 Rheumatoid arthritis, unspecified: Secondary | ICD-10-CM | POA: Diagnosis not present

## 2017-09-04 DIAGNOSIS — J449 Chronic obstructive pulmonary disease, unspecified: Secondary | ICD-10-CM | POA: Diagnosis not present

## 2017-09-04 DIAGNOSIS — T8140XD Infection following a procedure, unspecified, subsequent encounter: Secondary | ICD-10-CM | POA: Diagnosis not present

## 2017-09-04 DIAGNOSIS — I110 Hypertensive heart disease with heart failure: Secondary | ICD-10-CM | POA: Diagnosis not present

## 2017-09-04 DIAGNOSIS — B9561 Methicillin susceptible Staphylococcus aureus infection as the cause of diseases classified elsewhere: Secondary | ICD-10-CM | POA: Diagnosis not present

## 2017-09-04 DIAGNOSIS — S92901G Unspecified fracture of right foot, subsequent encounter for fracture with delayed healing: Secondary | ICD-10-CM | POA: Diagnosis not present

## 2017-09-04 DIAGNOSIS — M069 Rheumatoid arthritis, unspecified: Secondary | ICD-10-CM | POA: Diagnosis not present

## 2017-09-05 ENCOUNTER — Ambulatory Visit: Payer: Federal, State, Local not specified - PPO | Admitting: Internal Medicine

## 2017-09-05 DIAGNOSIS — T8131XA Disruption of external operation (surgical) wound, not elsewhere classified, initial encounter: Secondary | ICD-10-CM | POA: Diagnosis not present

## 2017-09-07 ENCOUNTER — Other Ambulatory Visit: Payer: Self-pay | Admitting: Internal Medicine

## 2017-09-07 DIAGNOSIS — J449 Chronic obstructive pulmonary disease, unspecified: Secondary | ICD-10-CM | POA: Diagnosis not present

## 2017-09-07 DIAGNOSIS — B9561 Methicillin susceptible Staphylococcus aureus infection as the cause of diseases classified elsewhere: Secondary | ICD-10-CM | POA: Diagnosis not present

## 2017-09-07 DIAGNOSIS — T8140XD Infection following a procedure, unspecified, subsequent encounter: Secondary | ICD-10-CM | POA: Diagnosis not present

## 2017-09-07 DIAGNOSIS — M069 Rheumatoid arthritis, unspecified: Secondary | ICD-10-CM | POA: Diagnosis not present

## 2017-09-07 DIAGNOSIS — S92901G Unspecified fracture of right foot, subsequent encounter for fracture with delayed healing: Secondary | ICD-10-CM | POA: Diagnosis not present

## 2017-09-07 DIAGNOSIS — I110 Hypertensive heart disease with heart failure: Secondary | ICD-10-CM | POA: Diagnosis not present

## 2017-09-07 MED ORDER — OXYCODONE HCL 10 MG PO TABS: 10.0000 mg | ORAL_TABLET | Freq: Two times a day (BID) | ORAL | 0 refills | Status: DC | PRN

## 2017-09-07 NOTE — Telephone Encounter (Signed)
Copied from [place] [phone]. Topic: Quick Communication - Rx Refill/Question >> Sep 07, 2017  9:29 AM Celedonio Savage L wrote: Medication: Oxycodone HCl 10 MG TABS  Has the patient contacted their pharmacy? No. Pt states that every time she call they tell her to call her provider (Agent: If no, request that the patient contact the pharmacy for the refill.) (Agent: If yes, when and what did the pharmacy advise?)  Preferred Pharmacy (with phone number or street name):     [id number] - [place], [place] - [place] [phone] (Phone) [phone] (Fax)      Agent: Please be advised that RX refills may take up to 3 business days. We ask that you follow-up with your pharmacy.

## 2017-09-07 NOTE — Telephone Encounter (Signed)
Name of Medication: oxycodone  Name of Pharmacy: [facility] Last Fill or Written Date and Quantity: 5/20 #30 Last Office Visit and Type: 6/28-acute Next Office Visit and Type: 9/30 f/u Last Controlled Substance Agreement Date: 03/09/17 Last UDS: 03/09/2017

## 2017-09-10 DIAGNOSIS — I110 Hypertensive heart disease with heart failure: Secondary | ICD-10-CM | POA: Diagnosis not present

## 2017-09-10 DIAGNOSIS — T8140XD Infection following a procedure, unspecified, subsequent encounter: Secondary | ICD-10-CM | POA: Diagnosis not present

## 2017-09-10 DIAGNOSIS — J449 Chronic obstructive pulmonary disease, unspecified: Secondary | ICD-10-CM | POA: Diagnosis not present

## 2017-09-10 DIAGNOSIS — S92901G Unspecified fracture of right foot, subsequent encounter for fracture with delayed healing: Secondary | ICD-10-CM | POA: Diagnosis not present

## 2017-09-10 DIAGNOSIS — B9561 Methicillin susceptible Staphylococcus aureus infection as the cause of diseases classified elsewhere: Secondary | ICD-10-CM | POA: Diagnosis not present

## 2017-09-10 DIAGNOSIS — M069 Rheumatoid arthritis, unspecified: Secondary | ICD-10-CM | POA: Diagnosis not present

## 2017-09-12 DIAGNOSIS — S92901G Unspecified fracture of right foot, subsequent encounter for fracture with delayed healing: Secondary | ICD-10-CM | POA: Diagnosis not present

## 2017-09-12 DIAGNOSIS — B9561 Methicillin susceptible Staphylococcus aureus infection as the cause of diseases classified elsewhere: Secondary | ICD-10-CM | POA: Diagnosis not present

## 2017-09-12 DIAGNOSIS — T8140XD Infection following a procedure, unspecified, subsequent encounter: Secondary | ICD-10-CM | POA: Diagnosis not present

## 2017-09-12 DIAGNOSIS — J449 Chronic obstructive pulmonary disease, unspecified: Secondary | ICD-10-CM | POA: Diagnosis not present

## 2017-09-12 DIAGNOSIS — I110 Hypertensive heart disease with heart failure: Secondary | ICD-10-CM | POA: Diagnosis not present

## 2017-09-12 DIAGNOSIS — M069 Rheumatoid arthritis, unspecified: Secondary | ICD-10-CM | POA: Diagnosis not present

## 2017-09-14 DIAGNOSIS — T8140XD Infection following a procedure, unspecified, subsequent encounter: Secondary | ICD-10-CM | POA: Diagnosis not present

## 2017-09-14 DIAGNOSIS — B9561 Methicillin susceptible Staphylococcus aureus infection as the cause of diseases classified elsewhere: Secondary | ICD-10-CM | POA: Diagnosis not present

## 2017-09-14 DIAGNOSIS — I110 Hypertensive heart disease with heart failure: Secondary | ICD-10-CM | POA: Diagnosis not present

## 2017-09-14 DIAGNOSIS — S92901G Unspecified fracture of right foot, subsequent encounter for fracture with delayed healing: Secondary | ICD-10-CM | POA: Diagnosis not present

## 2017-09-14 DIAGNOSIS — J449 Chronic obstructive pulmonary disease, unspecified: Secondary | ICD-10-CM | POA: Diagnosis not present

## 2017-09-14 DIAGNOSIS — M069 Rheumatoid arthritis, unspecified: Secondary | ICD-10-CM | POA: Diagnosis not present

## 2017-09-17 DIAGNOSIS — J449 Chronic obstructive pulmonary disease, unspecified: Secondary | ICD-10-CM | POA: Diagnosis not present

## 2017-09-17 DIAGNOSIS — S92901G Unspecified fracture of right foot, subsequent encounter for fracture with delayed healing: Secondary | ICD-10-CM | POA: Diagnosis not present

## 2017-09-17 DIAGNOSIS — I110 Hypertensive heart disease with heart failure: Secondary | ICD-10-CM | POA: Diagnosis not present

## 2017-09-17 DIAGNOSIS — T8140XD Infection following a procedure, unspecified, subsequent encounter: Secondary | ICD-10-CM | POA: Diagnosis not present

## 2017-09-17 DIAGNOSIS — M069 Rheumatoid arthritis, unspecified: Secondary | ICD-10-CM | POA: Diagnosis not present

## 2017-09-17 DIAGNOSIS — B9561 Methicillin susceptible Staphylococcus aureus infection as the cause of diseases classified elsewhere: Secondary | ICD-10-CM | POA: Diagnosis not present

## 2017-09-18 DIAGNOSIS — M19171 Post-traumatic osteoarthritis, right ankle and foot: Secondary | ICD-10-CM | POA: Diagnosis not present

## 2017-09-18 DIAGNOSIS — L97412 Non-pressure chronic ulcer of right heel and midfoot with fat layer exposed: Secondary | ICD-10-CM | POA: Diagnosis not present

## 2017-09-20 DIAGNOSIS — I110 Hypertensive heart disease with heart failure: Secondary | ICD-10-CM | POA: Diagnosis not present

## 2017-09-20 DIAGNOSIS — M069 Rheumatoid arthritis, unspecified: Secondary | ICD-10-CM | POA: Diagnosis not present

## 2017-09-20 DIAGNOSIS — T8140XD Infection following a procedure, unspecified, subsequent encounter: Secondary | ICD-10-CM | POA: Diagnosis not present

## 2017-09-20 DIAGNOSIS — S92901G Unspecified fracture of right foot, subsequent encounter for fracture with delayed healing: Secondary | ICD-10-CM | POA: Diagnosis not present

## 2017-09-20 DIAGNOSIS — B9561 Methicillin susceptible Staphylococcus aureus infection as the cause of diseases classified elsewhere: Secondary | ICD-10-CM | POA: Diagnosis not present

## 2017-09-20 DIAGNOSIS — J449 Chronic obstructive pulmonary disease, unspecified: Secondary | ICD-10-CM | POA: Diagnosis not present

## 2017-09-24 DIAGNOSIS — T8140XD Infection following a procedure, unspecified, subsequent encounter: Secondary | ICD-10-CM | POA: Diagnosis not present

## 2017-09-24 DIAGNOSIS — J449 Chronic obstructive pulmonary disease, unspecified: Secondary | ICD-10-CM | POA: Diagnosis not present

## 2017-09-24 DIAGNOSIS — M069 Rheumatoid arthritis, unspecified: Secondary | ICD-10-CM | POA: Diagnosis not present

## 2017-09-24 DIAGNOSIS — I110 Hypertensive heart disease with heart failure: Secondary | ICD-10-CM | POA: Diagnosis not present

## 2017-09-24 DIAGNOSIS — S92901G Unspecified fracture of right foot, subsequent encounter for fracture with delayed healing: Secondary | ICD-10-CM | POA: Diagnosis not present

## 2017-09-24 DIAGNOSIS — B9561 Methicillin susceptible Staphylococcus aureus infection as the cause of diseases classified elsewhere: Secondary | ICD-10-CM | POA: Diagnosis not present

## 2017-09-26 DIAGNOSIS — B9561 Methicillin susceptible Staphylococcus aureus infection as the cause of diseases classified elsewhere: Secondary | ICD-10-CM | POA: Diagnosis not present

## 2017-09-26 DIAGNOSIS — S92901G Unspecified fracture of right foot, subsequent encounter for fracture with delayed healing: Secondary | ICD-10-CM | POA: Diagnosis not present

## 2017-09-26 DIAGNOSIS — I110 Hypertensive heart disease with heart failure: Secondary | ICD-10-CM | POA: Diagnosis not present

## 2017-09-26 DIAGNOSIS — M069 Rheumatoid arthritis, unspecified: Secondary | ICD-10-CM | POA: Diagnosis not present

## 2017-09-26 DIAGNOSIS — T8140XD Infection following a procedure, unspecified, subsequent encounter: Secondary | ICD-10-CM | POA: Diagnosis not present

## 2017-09-26 DIAGNOSIS — J449 Chronic obstructive pulmonary disease, unspecified: Secondary | ICD-10-CM | POA: Diagnosis not present

## 2017-09-27 DIAGNOSIS — T8140XD Infection following a procedure, unspecified, subsequent encounter: Secondary | ICD-10-CM | POA: Diagnosis not present

## 2017-09-27 DIAGNOSIS — I110 Hypertensive heart disease with heart failure: Secondary | ICD-10-CM | POA: Diagnosis not present

## 2017-09-27 DIAGNOSIS — S92901G Unspecified fracture of right foot, subsequent encounter for fracture with delayed healing: Secondary | ICD-10-CM | POA: Diagnosis not present

## 2017-09-27 DIAGNOSIS — J449 Chronic obstructive pulmonary disease, unspecified: Secondary | ICD-10-CM | POA: Diagnosis not present

## 2017-09-27 DIAGNOSIS — M069 Rheumatoid arthritis, unspecified: Secondary | ICD-10-CM | POA: Diagnosis not present

## 2017-09-27 DIAGNOSIS — B9561 Methicillin susceptible Staphylococcus aureus infection as the cause of diseases classified elsewhere: Secondary | ICD-10-CM | POA: Diagnosis not present

## 2017-09-28 DIAGNOSIS — S92901G Unspecified fracture of right foot, subsequent encounter for fracture with delayed healing: Secondary | ICD-10-CM | POA: Diagnosis not present

## 2017-09-28 DIAGNOSIS — J449 Chronic obstructive pulmonary disease, unspecified: Secondary | ICD-10-CM | POA: Diagnosis not present

## 2017-09-28 DIAGNOSIS — B9561 Methicillin susceptible Staphylococcus aureus infection as the cause of diseases classified elsewhere: Secondary | ICD-10-CM | POA: Diagnosis not present

## 2017-09-28 DIAGNOSIS — I110 Hypertensive heart disease with heart failure: Secondary | ICD-10-CM | POA: Diagnosis not present

## 2017-09-28 DIAGNOSIS — T8140XD Infection following a procedure, unspecified, subsequent encounter: Secondary | ICD-10-CM | POA: Diagnosis not present

## 2017-09-28 DIAGNOSIS — M069 Rheumatoid arthritis, unspecified: Secondary | ICD-10-CM | POA: Diagnosis not present

## 2017-10-01 DIAGNOSIS — S92901G Unspecified fracture of right foot, subsequent encounter for fracture with delayed healing: Secondary | ICD-10-CM | POA: Diagnosis not present

## 2017-10-01 DIAGNOSIS — J449 Chronic obstructive pulmonary disease, unspecified: Secondary | ICD-10-CM | POA: Diagnosis not present

## 2017-10-01 DIAGNOSIS — T8140XD Infection following a procedure, unspecified, subsequent encounter: Secondary | ICD-10-CM | POA: Diagnosis not present

## 2017-10-01 DIAGNOSIS — M069 Rheumatoid arthritis, unspecified: Secondary | ICD-10-CM | POA: Diagnosis not present

## 2017-10-01 DIAGNOSIS — B9561 Methicillin susceptible Staphylococcus aureus infection as the cause of diseases classified elsewhere: Secondary | ICD-10-CM | POA: Diagnosis not present

## 2017-10-01 DIAGNOSIS — I110 Hypertensive heart disease with heart failure: Secondary | ICD-10-CM | POA: Diagnosis not present

## 2017-10-03 DIAGNOSIS — M069 Rheumatoid arthritis, unspecified: Secondary | ICD-10-CM | POA: Diagnosis not present

## 2017-10-03 DIAGNOSIS — J449 Chronic obstructive pulmonary disease, unspecified: Secondary | ICD-10-CM | POA: Diagnosis not present

## 2017-10-03 DIAGNOSIS — B9561 Methicillin susceptible Staphylococcus aureus infection as the cause of diseases classified elsewhere: Secondary | ICD-10-CM | POA: Diagnosis not present

## 2017-10-03 DIAGNOSIS — I110 Hypertensive heart disease with heart failure: Secondary | ICD-10-CM | POA: Diagnosis not present

## 2017-10-03 DIAGNOSIS — S92901G Unspecified fracture of right foot, subsequent encounter for fracture with delayed healing: Secondary | ICD-10-CM | POA: Diagnosis not present

## 2017-10-03 DIAGNOSIS — T8140XD Infection following a procedure, unspecified, subsequent encounter: Secondary | ICD-10-CM | POA: Diagnosis not present

## 2017-10-05 DIAGNOSIS — M069 Rheumatoid arthritis, unspecified: Secondary | ICD-10-CM | POA: Diagnosis not present

## 2017-10-05 DIAGNOSIS — T8140XD Infection following a procedure, unspecified, subsequent encounter: Secondary | ICD-10-CM | POA: Diagnosis not present

## 2017-10-05 DIAGNOSIS — S92901G Unspecified fracture of right foot, subsequent encounter for fracture with delayed healing: Secondary | ICD-10-CM | POA: Diagnosis not present

## 2017-10-05 DIAGNOSIS — I110 Hypertensive heart disease with heart failure: Secondary | ICD-10-CM | POA: Diagnosis not present

## 2017-10-05 DIAGNOSIS — B9561 Methicillin susceptible Staphylococcus aureus infection as the cause of diseases classified elsewhere: Secondary | ICD-10-CM | POA: Diagnosis not present

## 2017-10-05 DIAGNOSIS — J449 Chronic obstructive pulmonary disease, unspecified: Secondary | ICD-10-CM | POA: Diagnosis not present

## 2017-10-08 ENCOUNTER — Other Ambulatory Visit: Payer: Self-pay | Admitting: Internal Medicine

## 2017-10-08 DIAGNOSIS — I110 Hypertensive heart disease with heart failure: Secondary | ICD-10-CM | POA: Diagnosis not present

## 2017-10-08 DIAGNOSIS — B9561 Methicillin susceptible Staphylococcus aureus infection as the cause of diseases classified elsewhere: Secondary | ICD-10-CM | POA: Diagnosis not present

## 2017-10-08 DIAGNOSIS — J449 Chronic obstructive pulmonary disease, unspecified: Secondary | ICD-10-CM | POA: Diagnosis not present

## 2017-10-08 DIAGNOSIS — S92901G Unspecified fracture of right foot, subsequent encounter for fracture with delayed healing: Secondary | ICD-10-CM | POA: Diagnosis not present

## 2017-10-08 DIAGNOSIS — T8140XD Infection following a procedure, unspecified, subsequent encounter: Secondary | ICD-10-CM | POA: Diagnosis not present

## 2017-10-08 DIAGNOSIS — M069 Rheumatoid arthritis, unspecified: Secondary | ICD-10-CM | POA: Diagnosis not present

## 2017-10-08 MED ORDER — OXYCODONE HCL 10 MG PO TABS: 10.0000 mg | ORAL_TABLET | Freq: Two times a day (BID) | ORAL | 0 refills | Status: DC | PRN

## 2017-10-08 NOTE — Telephone Encounter (Signed)
Copied from [place] [id number]. Topic: Quick Communication - See Telephone Encounter >> Oct 08, 2017  8:46 AM Hewitt Shorts wrote: Pt is needing a refill on oxycodone  [address]   [employer]

## 2017-10-08 NOTE — Telephone Encounter (Signed)
Name of Medication:oxycodone 10 mg  Name of Pharmacy: [facility] [address] or Written Date and Quantity: # 120 on 09/07/17 Last Office Visit and Type: 08/24/17 Next Office Visit and Type: 11/26/17 FU Last Controlled Substance Agreement Date: 03/09/17 Last UDS:03/09/17

## 2017-10-08 NOTE — Telephone Encounter (Signed)
Refill of Oxycodone  LRF 09/07/17  #120  0 refills  LOV 08/24/17 Dr. Silvio Pate  [id number] - [place], [place] - [place]

## 2017-10-09 DIAGNOSIS — M19171 Post-traumatic osteoarthritis, right ankle and foot: Secondary | ICD-10-CM | POA: Diagnosis not present

## 2017-10-10 ENCOUNTER — Other Ambulatory Visit: Payer: Self-pay | Admitting: Internal Medicine

## 2017-10-10 DIAGNOSIS — T8140XD Infection following a procedure, unspecified, subsequent encounter: Secondary | ICD-10-CM | POA: Diagnosis not present

## 2017-10-10 DIAGNOSIS — S92901G Unspecified fracture of right foot, subsequent encounter for fracture with delayed healing: Secondary | ICD-10-CM | POA: Diagnosis not present

## 2017-10-10 DIAGNOSIS — M069 Rheumatoid arthritis, unspecified: Secondary | ICD-10-CM | POA: Diagnosis not present

## 2017-10-10 DIAGNOSIS — B9561 Methicillin susceptible Staphylococcus aureus infection as the cause of diseases classified elsewhere: Secondary | ICD-10-CM | POA: Diagnosis not present

## 2017-10-10 DIAGNOSIS — J449 Chronic obstructive pulmonary disease, unspecified: Secondary | ICD-10-CM | POA: Diagnosis not present

## 2017-10-10 DIAGNOSIS — I110 Hypertensive heart disease with heart failure: Secondary | ICD-10-CM | POA: Diagnosis not present

## 2017-10-15 DIAGNOSIS — B9561 Methicillin susceptible Staphylococcus aureus infection as the cause of diseases classified elsewhere: Secondary | ICD-10-CM | POA: Diagnosis not present

## 2017-10-15 DIAGNOSIS — J449 Chronic obstructive pulmonary disease, unspecified: Secondary | ICD-10-CM | POA: Diagnosis not present

## 2017-10-15 DIAGNOSIS — M069 Rheumatoid arthritis, unspecified: Secondary | ICD-10-CM | POA: Diagnosis not present

## 2017-10-15 DIAGNOSIS — T8140XD Infection following a procedure, unspecified, subsequent encounter: Secondary | ICD-10-CM | POA: Diagnosis not present

## 2017-10-15 DIAGNOSIS — S92901G Unspecified fracture of right foot, subsequent encounter for fracture with delayed healing: Secondary | ICD-10-CM | POA: Diagnosis not present

## 2017-10-15 DIAGNOSIS — I110 Hypertensive heart disease with heart failure: Secondary | ICD-10-CM | POA: Diagnosis not present

## 2017-10-16 DIAGNOSIS — T8140XD Infection following a procedure, unspecified, subsequent encounter: Secondary | ICD-10-CM | POA: Diagnosis not present

## 2017-10-16 DIAGNOSIS — M069 Rheumatoid arthritis, unspecified: Secondary | ICD-10-CM | POA: Diagnosis not present

## 2017-10-16 DIAGNOSIS — J449 Chronic obstructive pulmonary disease, unspecified: Secondary | ICD-10-CM | POA: Diagnosis not present

## 2017-10-16 DIAGNOSIS — S92901G Unspecified fracture of right foot, subsequent encounter for fracture with delayed healing: Secondary | ICD-10-CM | POA: Diagnosis not present

## 2017-10-16 DIAGNOSIS — I110 Hypertensive heart disease with heart failure: Secondary | ICD-10-CM | POA: Diagnosis not present

## 2017-10-16 DIAGNOSIS — B9561 Methicillin susceptible Staphylococcus aureus infection as the cause of diseases classified elsewhere: Secondary | ICD-10-CM | POA: Diagnosis not present

## 2017-10-18 DIAGNOSIS — T8140XD Infection following a procedure, unspecified, subsequent encounter: Secondary | ICD-10-CM | POA: Diagnosis not present

## 2017-10-18 DIAGNOSIS — S92901G Unspecified fracture of right foot, subsequent encounter for fracture with delayed healing: Secondary | ICD-10-CM | POA: Diagnosis not present

## 2017-10-18 DIAGNOSIS — M069 Rheumatoid arthritis, unspecified: Secondary | ICD-10-CM | POA: Diagnosis not present

## 2017-10-18 DIAGNOSIS — J449 Chronic obstructive pulmonary disease, unspecified: Secondary | ICD-10-CM | POA: Diagnosis not present

## 2017-10-18 DIAGNOSIS — B9561 Methicillin susceptible Staphylococcus aureus infection as the cause of diseases classified elsewhere: Secondary | ICD-10-CM | POA: Diagnosis not present

## 2017-10-18 DIAGNOSIS — I110 Hypertensive heart disease with heart failure: Secondary | ICD-10-CM | POA: Diagnosis not present

## 2017-10-19 DIAGNOSIS — I480 Paroxysmal atrial fibrillation: Secondary | ICD-10-CM | POA: Diagnosis not present

## 2017-10-19 DIAGNOSIS — S92901G Unspecified fracture of right foot, subsequent encounter for fracture with delayed healing: Secondary | ICD-10-CM | POA: Diagnosis not present

## 2017-10-19 DIAGNOSIS — I5032 Chronic diastolic (congestive) heart failure: Secondary | ICD-10-CM | POA: Diagnosis not present

## 2017-10-19 DIAGNOSIS — T8140XD Infection following a procedure, unspecified, subsequent encounter: Secondary | ICD-10-CM | POA: Diagnosis not present

## 2017-10-19 DIAGNOSIS — M6281 Muscle weakness (generalized): Secondary | ICD-10-CM | POA: Diagnosis not present

## 2017-10-19 DIAGNOSIS — Z4801 Encounter for change or removal of surgical wound dressing: Secondary | ICD-10-CM | POA: Diagnosis not present

## 2017-10-19 DIAGNOSIS — F4541 Pain disorder exclusively related to psychological factors: Secondary | ICD-10-CM | POA: Diagnosis not present

## 2017-10-19 DIAGNOSIS — I110 Hypertensive heart disease with heart failure: Secondary | ICD-10-CM | POA: Diagnosis not present

## 2017-10-19 DIAGNOSIS — Z79899 Other long term (current) drug therapy: Secondary | ICD-10-CM | POA: Diagnosis not present

## 2017-10-19 DIAGNOSIS — E785 Hyperlipidemia, unspecified: Secondary | ICD-10-CM | POA: Diagnosis not present

## 2017-10-19 DIAGNOSIS — N3281 Overactive bladder: Secondary | ICD-10-CM | POA: Diagnosis not present

## 2017-10-19 DIAGNOSIS — M069 Rheumatoid arthritis, unspecified: Secondary | ICD-10-CM | POA: Diagnosis not present

## 2017-10-19 DIAGNOSIS — J449 Chronic obstructive pulmonary disease, unspecified: Secondary | ICD-10-CM | POA: Diagnosis not present

## 2017-10-19 DIAGNOSIS — G4730 Sleep apnea, unspecified: Secondary | ICD-10-CM | POA: Diagnosis not present

## 2017-10-19 DIAGNOSIS — J45909 Unspecified asthma, uncomplicated: Secondary | ICD-10-CM | POA: Diagnosis not present

## 2017-10-19 DIAGNOSIS — H25813 Combined forms of age-related cataract, bilateral: Secondary | ICD-10-CM | POA: Diagnosis not present

## 2017-10-19 DIAGNOSIS — Z79891 Long term (current) use of opiate analgesic: Secondary | ICD-10-CM | POA: Diagnosis not present

## 2017-10-24 DIAGNOSIS — T8140XD Infection following a procedure, unspecified, subsequent encounter: Secondary | ICD-10-CM | POA: Diagnosis not present

## 2017-10-24 DIAGNOSIS — I110 Hypertensive heart disease with heart failure: Secondary | ICD-10-CM | POA: Diagnosis not present

## 2017-10-24 DIAGNOSIS — S92901G Unspecified fracture of right foot, subsequent encounter for fracture with delayed healing: Secondary | ICD-10-CM | POA: Diagnosis not present

## 2017-10-24 DIAGNOSIS — M069 Rheumatoid arthritis, unspecified: Secondary | ICD-10-CM | POA: Diagnosis not present

## 2017-10-24 DIAGNOSIS — J449 Chronic obstructive pulmonary disease, unspecified: Secondary | ICD-10-CM | POA: Diagnosis not present

## 2017-10-24 DIAGNOSIS — I5032 Chronic diastolic (congestive) heart failure: Secondary | ICD-10-CM | POA: Diagnosis not present

## 2017-10-25 DIAGNOSIS — I110 Hypertensive heart disease with heart failure: Secondary | ICD-10-CM | POA: Diagnosis not present

## 2017-10-25 DIAGNOSIS — T8140XD Infection following a procedure, unspecified, subsequent encounter: Secondary | ICD-10-CM | POA: Diagnosis not present

## 2017-10-25 DIAGNOSIS — J449 Chronic obstructive pulmonary disease, unspecified: Secondary | ICD-10-CM | POA: Diagnosis not present

## 2017-10-25 DIAGNOSIS — M069 Rheumatoid arthritis, unspecified: Secondary | ICD-10-CM | POA: Diagnosis not present

## 2017-10-25 DIAGNOSIS — S92901G Unspecified fracture of right foot, subsequent encounter for fracture with delayed healing: Secondary | ICD-10-CM | POA: Diagnosis not present

## 2017-10-25 DIAGNOSIS — I5032 Chronic diastolic (congestive) heart failure: Secondary | ICD-10-CM | POA: Diagnosis not present

## 2017-10-31 DIAGNOSIS — I110 Hypertensive heart disease with heart failure: Secondary | ICD-10-CM | POA: Diagnosis not present

## 2017-10-31 DIAGNOSIS — I5032 Chronic diastolic (congestive) heart failure: Secondary | ICD-10-CM | POA: Diagnosis not present

## 2017-10-31 DIAGNOSIS — J449 Chronic obstructive pulmonary disease, unspecified: Secondary | ICD-10-CM | POA: Diagnosis not present

## 2017-10-31 DIAGNOSIS — T8140XD Infection following a procedure, unspecified, subsequent encounter: Secondary | ICD-10-CM | POA: Diagnosis not present

## 2017-10-31 DIAGNOSIS — S92901G Unspecified fracture of right foot, subsequent encounter for fracture with delayed healing: Secondary | ICD-10-CM | POA: Diagnosis not present

## 2017-10-31 DIAGNOSIS — M069 Rheumatoid arthritis, unspecified: Secondary | ICD-10-CM | POA: Diagnosis not present

## 2017-11-01 DIAGNOSIS — T8140XD Infection following a procedure, unspecified, subsequent encounter: Secondary | ICD-10-CM | POA: Diagnosis not present

## 2017-11-01 DIAGNOSIS — J449 Chronic obstructive pulmonary disease, unspecified: Secondary | ICD-10-CM | POA: Diagnosis not present

## 2017-11-01 DIAGNOSIS — S92901G Unspecified fracture of right foot, subsequent encounter for fracture with delayed healing: Secondary | ICD-10-CM | POA: Diagnosis not present

## 2017-11-01 DIAGNOSIS — M069 Rheumatoid arthritis, unspecified: Secondary | ICD-10-CM | POA: Diagnosis not present

## 2017-11-01 DIAGNOSIS — I5032 Chronic diastolic (congestive) heart failure: Secondary | ICD-10-CM | POA: Diagnosis not present

## 2017-11-01 DIAGNOSIS — I110 Hypertensive heart disease with heart failure: Secondary | ICD-10-CM | POA: Diagnosis not present

## 2017-11-05 ENCOUNTER — Telehealth: Payer: Self-pay | Admitting: Internal Medicine

## 2017-11-05 NOTE — Telephone Encounter (Signed)
Noted  

## 2017-11-05 NOTE — Telephone Encounter (Signed)
Copied from [place] [phone]. Topic: Quick Communication - See Telephone Encounter >> Nov 05, 2017  2:23 PM Synthia Innocent wrote: CRM for notification. See Telephone encounter for: 11/05/17. Larena Glassman Case Manager from [facility] is assisting patient with her needs, if Dr Silvio Pate needs anythign please let her know.

## 2017-11-06 DIAGNOSIS — M069 Rheumatoid arthritis, unspecified: Secondary | ICD-10-CM | POA: Diagnosis not present

## 2017-11-06 DIAGNOSIS — S92901G Unspecified fracture of right foot, subsequent encounter for fracture with delayed healing: Secondary | ICD-10-CM | POA: Diagnosis not present

## 2017-11-06 DIAGNOSIS — J449 Chronic obstructive pulmonary disease, unspecified: Secondary | ICD-10-CM | POA: Diagnosis not present

## 2017-11-06 DIAGNOSIS — T8140XD Infection following a procedure, unspecified, subsequent encounter: Secondary | ICD-10-CM | POA: Diagnosis not present

## 2017-11-06 DIAGNOSIS — I5032 Chronic diastolic (congestive) heart failure: Secondary | ICD-10-CM | POA: Diagnosis not present

## 2017-11-06 DIAGNOSIS — I110 Hypertensive heart disease with heart failure: Secondary | ICD-10-CM | POA: Diagnosis not present

## 2017-11-07 ENCOUNTER — Other Ambulatory Visit: Payer: Self-pay | Admitting: Internal Medicine

## 2017-11-07 DIAGNOSIS — Z9889 Other specified postprocedural states: Secondary | ICD-10-CM | POA: Diagnosis not present

## 2017-11-07 DIAGNOSIS — M19171 Post-traumatic osteoarthritis, right ankle and foot: Secondary | ICD-10-CM | POA: Diagnosis not present

## 2017-11-07 MED ORDER — OXYCODONE HCL 10 MG PO TABS: 10.0000 mg | ORAL_TABLET | Freq: Two times a day (BID) | ORAL | 0 refills | Status: DC | PRN

## 2017-11-07 NOTE — Telephone Encounter (Signed)
Copied from [place] [phone]. Topic: Quick Communication - Rx Refill/Question >> Nov 07, 2017  9:12 AM Bea Graff, NT wrote: Medication: Oxycodone HCl 10 MG TABS   Has the patient contacted their pharmacy? Yes.   (Agent: If no, request that the patient contact the pharmacy for the refill.) (Agent: If yes, when and what did the pharmacy advise?)  Preferred Pharmacy (with phone number or street name): [id number] - [place], [place] - [place] [phone] (Phone) [phone] (Fax)    Agent: Please be advised that RX refills may take up to 3 business days. We ask that you follow-up with your pharmacy.

## 2017-11-07 NOTE — Telephone Encounter (Signed)
Name of Medication:oxycodone 10 mg  Name of Pharmacy: [facility] [address] [place] or Written Date and Quantity: # 120 on 10/08/17 Last Office Visit and Type: 08/24/17 rehab FU Next Office Visit and Type: 11/26/17 3 mth FU Last Controlled Substance Agreement Date: 03/09/17 Last UDS:03/09/17

## 2017-11-09 DIAGNOSIS — S92901G Unspecified fracture of right foot, subsequent encounter for fracture with delayed healing: Secondary | ICD-10-CM | POA: Diagnosis not present

## 2017-11-09 DIAGNOSIS — I110 Hypertensive heart disease with heart failure: Secondary | ICD-10-CM | POA: Diagnosis not present

## 2017-11-09 DIAGNOSIS — T8140XD Infection following a procedure, unspecified, subsequent encounter: Secondary | ICD-10-CM | POA: Diagnosis not present

## 2017-11-09 DIAGNOSIS — I5032 Chronic diastolic (congestive) heart failure: Secondary | ICD-10-CM | POA: Diagnosis not present

## 2017-11-09 DIAGNOSIS — M069 Rheumatoid arthritis, unspecified: Secondary | ICD-10-CM | POA: Diagnosis not present

## 2017-11-09 DIAGNOSIS — J449 Chronic obstructive pulmonary disease, unspecified: Secondary | ICD-10-CM | POA: Diagnosis not present

## 2017-11-12 DIAGNOSIS — I110 Hypertensive heart disease with heart failure: Secondary | ICD-10-CM | POA: Diagnosis not present

## 2017-11-12 DIAGNOSIS — T8140XD Infection following a procedure, unspecified, subsequent encounter: Secondary | ICD-10-CM | POA: Diagnosis not present

## 2017-11-12 DIAGNOSIS — I5032 Chronic diastolic (congestive) heart failure: Secondary | ICD-10-CM | POA: Diagnosis not present

## 2017-11-12 DIAGNOSIS — S92901G Unspecified fracture of right foot, subsequent encounter for fracture with delayed healing: Secondary | ICD-10-CM | POA: Diagnosis not present

## 2017-11-12 DIAGNOSIS — J449 Chronic obstructive pulmonary disease, unspecified: Secondary | ICD-10-CM | POA: Diagnosis not present

## 2017-11-12 DIAGNOSIS — M069 Rheumatoid arthritis, unspecified: Secondary | ICD-10-CM | POA: Diagnosis not present

## 2017-11-14 DIAGNOSIS — I5032 Chronic diastolic (congestive) heart failure: Secondary | ICD-10-CM | POA: Diagnosis not present

## 2017-11-14 DIAGNOSIS — S92901G Unspecified fracture of right foot, subsequent encounter for fracture with delayed healing: Secondary | ICD-10-CM | POA: Diagnosis not present

## 2017-11-14 DIAGNOSIS — I110 Hypertensive heart disease with heart failure: Secondary | ICD-10-CM | POA: Diagnosis not present

## 2017-11-14 DIAGNOSIS — M069 Rheumatoid arthritis, unspecified: Secondary | ICD-10-CM | POA: Diagnosis not present

## 2017-11-14 DIAGNOSIS — T8140XD Infection following a procedure, unspecified, subsequent encounter: Secondary | ICD-10-CM | POA: Diagnosis not present

## 2017-11-14 DIAGNOSIS — J449 Chronic obstructive pulmonary disease, unspecified: Secondary | ICD-10-CM | POA: Diagnosis not present

## 2017-11-19 ENCOUNTER — Other Ambulatory Visit: Payer: Self-pay | Admitting: Family Medicine

## 2017-11-20 DIAGNOSIS — T8140XD Infection following a procedure, unspecified, subsequent encounter: Secondary | ICD-10-CM | POA: Diagnosis not present

## 2017-11-20 DIAGNOSIS — I110 Hypertensive heart disease with heart failure: Secondary | ICD-10-CM | POA: Diagnosis not present

## 2017-11-20 DIAGNOSIS — J449 Chronic obstructive pulmonary disease, unspecified: Secondary | ICD-10-CM | POA: Diagnosis not present

## 2017-11-20 DIAGNOSIS — I5032 Chronic diastolic (congestive) heart failure: Secondary | ICD-10-CM | POA: Diagnosis not present

## 2017-11-20 DIAGNOSIS — M069 Rheumatoid arthritis, unspecified: Secondary | ICD-10-CM | POA: Diagnosis not present

## 2017-11-20 DIAGNOSIS — S92901G Unspecified fracture of right foot, subsequent encounter for fracture with delayed healing: Secondary | ICD-10-CM | POA: Diagnosis not present

## 2017-11-20 NOTE — Telephone Encounter (Signed)
SB-Plz see refill req/thx dmf 

## 2017-11-26 ENCOUNTER — Encounter: Payer: Self-pay | Admitting: Internal Medicine

## 2017-11-26 ENCOUNTER — Encounter (INDEPENDENT_AMBULATORY_CARE_PROVIDER_SITE_OTHER): Payer: Self-pay

## 2017-11-26 ENCOUNTER — Ambulatory Visit (INDEPENDENT_AMBULATORY_CARE_PROVIDER_SITE_OTHER): Payer: Medicare Other | Admitting: Internal Medicine

## 2017-11-26 VITALS — BP 122/84 | HR 67 | Temp 97.4°F | Ht 65.0 in | Wt 313.0 lb

## 2017-11-26 DIAGNOSIS — F1120 Opioid dependence, uncomplicated: Secondary | ICD-10-CM

## 2017-11-26 DIAGNOSIS — I890 Lymphedema, not elsewhere classified: Secondary | ICD-10-CM | POA: Diagnosis not present

## 2017-11-26 DIAGNOSIS — G894 Chronic pain syndrome: Secondary | ICD-10-CM | POA: Diagnosis not present

## 2017-11-26 DIAGNOSIS — Z23 Encounter for immunization: Secondary | ICD-10-CM

## 2017-11-26 NOTE — Progress Notes (Signed)
Subjective:    Patient ID: Tina Pacheco, female    DOB: [birth year], 67 y.o.   MRN: [id number]  HPI Here for follow up of chronic pain and narcotic dependence  Recovery from the multiple foot surgeries has been very prolonged "it looks like a real foot again" No sig pain there---unless she steps wrong (still in surgical boot)  Still with severe back pain Tends to hunch over when walking Trying to walk straighter, good breathing and proper use of rollator  Pain control mostly fair "unless it gets too far in front of me" Still uses 20mg  every 12 hours---"holds it at bay"  Current Outpatient Medications on File Prior to Visit  Medication Sig Dispense Refill  . amLODipine (NORVASC) 5 MG tablet Take 5 mgs by mouth once daily in the evening 90 tablet 3  . atorvastatin (LIPITOR) 20 MG tablet Take 1 tablet (20 mg total) by mouth every evening.    Marland Kitchen atorvastatin (LIPITOR) 20 MG tablet TAKE 1 TABLET DAILY AT 6PM 90 tablet 1  . diphenhydrAMINE (BENADRYL) 25 MG tablet Take 25 mg by mouth daily as needed for allergies.    . febuxostat (ULORIC) 40 MG tablet Take 40 mg by mouth daily.    . folic acid (FOLVITE) 1 MG tablet Take 1 mg by mouth daily.    . hydroxychloroquine (PLAQUENIL) 200 MG tablet Take 200 mg by mouth 2 (two) times daily.     Marland Kitchen KLOR-CON M20 20 MEQ tablet TAKE 1/2 TABLET (=10MEQ)   TWO TIMES A DAY ; MAY TAKE AND EXTRA TABLET WHEN      TAKING TORSEMIDE 180 tablet 3  . leflunomide (ARAVA) 20 MG tablet Take 20 mg by mouth every morning.     . metoprolol succinate (TOPROL-XL) 50 MG 24 hr tablet Take 1 tablet (50 mg total) by mouth every evening. Take with or immediately following a meal.    . montelukast (SINGULAIR) 10 MG tablet TAKE 1 TABLET BY MOUTH AT BEDTIME 90 tablet 0  . Multiple Vitamin (MULTIVITAMIN) tablet Take 1 tablet by mouth daily.     . Oxycodone HCl 10 MG TABS Take 1-2 tablets (10-20 mg total) by mouth 2 (two) times daily as needed. 120 tablet 0  . polyethylene glycol  (MIRALAX / GLYCOLAX) packet Take 17 g by mouth daily as needed for mild constipation. 14 each 0  . PRADAXA 150 MG CAPS capsule TAKE 1 CAPSULE TWICE DAILY 180 capsule 3  . pregabalin (LYRICA) 150 MG capsule Take 150 mg by mouth 3 (three) times daily.    . SF 5000 PLUS 1.1 % CREA dental cream Place 1 application onto teeth 2 (two) times daily.  1  . sodium chloride (OCEAN) 0.65 % SOLN nasal spray Place 1 spray into both nostrils as needed for congestion.    Marland Kitchen terazosin (HYTRIN) 10 MG capsule TAKE 1 CAPSULE BY MOUTH AT BEDTIME 90 capsule 3  . tolterodine (DETROL LA) 2 MG 24 hr capsule Take 1 capsule (2 mg total) by mouth daily as needed (bladder spasms). 30 capsule 5  . torsemide (DEMADEX) 20 MG tablet Take 20 mg by mouth twice daily as needed for swelling 60 tablet 3   No current facility-administered medications on file prior to visit.     No Known Allergies  Past Medical History:  Diagnosis Date  . Arthritis    RA  . Asthma   . Basal cell carcinoma   . Cataract 2018   bilateral eyes; corrected with surgery  .  Claustrophobia   . COPD (chronic obstructive pulmonary disease) ([place])   . Diastolic dysfunction    a. echo 07/2014: EF 55-60%, no RWMA, GR2DD, mild MR, LA moderately dilated, PASP 38 mm Hg  . Dyslipidemia   . Dysrhythmia   . Headache    migraines  . Hemihypertrophy   . History of cardiac cath    a. 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 History of gout   . Hyperplastic colonic polyp 2003  . Hypertension   . Hypokalemia   . Morbid obesity ([place])   . PAF (paroxysmal atrial fibrillation) ([facility])    a. on Pradaxa; b. CHADSVASc at least 2 (HTN & female)  . Rheumatoid arthritis(714.0)   . Sleep apnea    a. not compliant with CPAP    Past Surgical History:  Procedure Laterality Date  . ABDOMINAL HYSTERECTOMY    . APPLICATION OF WOUND VAC Right 08/09/2017   Procedure: APPLICATION OF WOUND VAC;  Surgeon: Albertine Patricia, DPM;  Location: [facility];  Service: Podiatry;   Laterality: Right;  . CARDIAC CATHETERIZATION  05/24/2010   nonobstructive CAD  . CATARACT EXTRACTION W/ INTRAOCULAR LENS IMPLANT Right 06/12/2016   Dr. Darleen Crocker  . CATARACT EXTRACTION W/ INTRAOCULAR LENS IMPLANT Left 06/26/2016   Dr. Darleen Crocker  . CESAREAN SECTION    . CHOLECYSTECTOMY    . COLONOSCOPY  06/12/2011   Procedure: COLONOSCOPY;  Surgeon: Juanita Craver, MD;  [facility]: [facility];  Service: Endoscopy;  Laterality: N/A;  . COLONOSCOPY N/A 03/17/2013   Procedure: COLONOSCOPY;  Surgeon: Juanita Craver, MD;  Location: [facility];  Service: Endoscopy;  Laterality: N/A;  . EYE SURGERY    . FOOT ARTHRODESIS Right 07/13/2017   Procedure: ARTHRODESIS FOOT-MULTI.FUSIONS (6 JOINTS);  Surgeon: Albertine Patricia, DPM;  Location: [facility];  Service: Podiatry;  Laterality: Right;  . IRRIGATION AND DEBRIDEMENT FOOT Right 08/09/2017   Procedure: IRRIGATION AND DEBRIDEMENT FOOT;  Surgeon: Albertine Patricia, DPM;  Location: [facility];  Service: Podiatry;  Laterality: Right;  . KNEE ARTHROSCOPY     bilateral  . SINUSOTOMY    . TOOTH EXTRACTION  12/2016  . VAGINAL HYSTERECTOMY      Family History  Problem Relation Age of Onset  . Emphysema Father        smoked  . Tuberculosis Mother   . Parkinsonism Mother   . Diabetes type II Sister   . Breast cancer Sister   . Breast cancer Maternal Aunt   . Tuberculosis Sister     Social History   Socioeconomic History  . Marital status: Married    Spouse name: Not on file  . Number of children: 1  . Years of education: Not on file  . Highest education level: Not on file  Occupational History  . 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 Financial resource strain: Not on file  . Food insecurity:    Worry: Not on file    Inability: Not on file  . Transportation needs:    Medical: Not on file    Non-medical: Not on file  Tobacco Use  . Smoking status: Never Smoker  . Smokeless tobacco: Never  Used  Substance and Sexual Activity  . Alcohol use: No  . Drug use: No  . Sexual activity: Not on file  Lifestyle  . Physical activity:    Days per week: Not on file    Minutes per session: Not on file  .  Stress: Not on file  Relationships  . Social connections:    Talks on phone: Not on file    Gets together: Not on file    Attends religious service: Not on file    Active member of club or organization: Not on file    Attends meetings of clubs or organizations: Not on file    Relationship status: Not on file  . Intimate partner violence:    Fear of current or ex partner: Not on file    Emotionally abused: Not on file    Physically abused: Not on file    Forced sexual activity: Not on file  Other Topics Concern  . Not on file  Social History Narrative   No living will   Husband, then daughter should be decision maker   Would accept resuscitation attempts   Not sure about tube feeds   Review of Systems Still on lyrica from rheumatologist Now finished with home health PT---ready to start outpatient PT (Dr Elvina Mattes to manage this) Concerns about ongoing lymphedema---swelling mostly in thighs (much more so on the left)    Objective:   Physical Exam  Constitutional: No distress.  Musculoskeletal:  2-3+ non pitting swelling in calves Massive enlargement in thighs---much more prominent on the left           Assessment & Plan:

## 2017-11-26 NOTE — Assessment & Plan Note (Signed)
Continues on the regular oxycodone and pain control is acceptable

## 2017-11-26 NOTE — Assessment & Plan Note (Signed)
Presumed reason for asymmetric thigh swelling Not venous disease May be affecting her successful ambulation Will set up with vascular surgeon to evaluate

## 2017-11-26 NOTE — Assessment & Plan Note (Signed)
CSRS reviewed--no concerns 

## 2017-12-05 DIAGNOSIS — T8131XA Disruption of external operation (surgical) wound, not elsewhere classified, initial encounter: Secondary | ICD-10-CM | POA: Diagnosis not present

## 2017-12-05 DIAGNOSIS — M19171 Post-traumatic osteoarthritis, right ankle and foot: Secondary | ICD-10-CM | POA: Diagnosis not present

## 2017-12-06 ENCOUNTER — Ambulatory Visit (INDEPENDENT_AMBULATORY_CARE_PROVIDER_SITE_OTHER): Payer: Medicare Other | Admitting: Nurse Practitioner

## 2017-12-06 ENCOUNTER — Encounter (INDEPENDENT_AMBULATORY_CARE_PROVIDER_SITE_OTHER): Payer: Self-pay | Admitting: Nurse Practitioner

## 2017-12-06 ENCOUNTER — Other Ambulatory Visit: Payer: Self-pay | Admitting: Internal Medicine

## 2017-12-06 VITALS — BP 168/80 | HR 64 | Resp 16 | Ht 63.0 in | Wt 306.0 lb

## 2017-12-06 DIAGNOSIS — I890 Lymphedema, not elsewhere classified: Secondary | ICD-10-CM

## 2017-12-06 DIAGNOSIS — I10 Essential (primary) hypertension: Secondary | ICD-10-CM

## 2017-12-06 DIAGNOSIS — M1990 Unspecified osteoarthritis, unspecified site: Secondary | ICD-10-CM | POA: Diagnosis not present

## 2017-12-06 MED ORDER — OXYCODONE HCL 10 MG PO TABS: 10.0000 mg | ORAL_TABLET | Freq: Two times a day (BID) | ORAL | 0 refills | Status: DC | PRN

## 2017-12-06 NOTE — Telephone Encounter (Signed)
Requested medication (s) are due for refill today: yes  Requested medication (s) are on the active medication list: yes  Last refill:  11/07/17  Future visit scheduled: yes  Notes to clinic:  undelegated    Requested Prescriptions  Pending Prescriptions Disp Refills   Oxycodone HCl 10 MG TABS 120 tablet 0    Sig: Take 1-2 tablets (10-20 mg total) by mouth 2 (two) times daily as needed.     Not Delegated - Analgesics:  Opioid Agonists Failed - 12/06/2017 12:07 PM      Failed - This refill cannot be delegated      Failed - Urine Drug Screen completed in last 360 days.      Passed - Valid encounter within last 6 months    Recent Outpatient Visits          1 week ago Chronic pain syndrome   [facility] at [place], MD   3 months ago Cellulitis of right lower extremity   [place] [facility] [place], MD   5 months ago Preop examination   [facility]rap[facility] at [place], MD   6 months ago Metatarsalgia of right Chief Strategy Officer at [place], MD   7 months ago Nausea   [facility]rap[facility] at [place], MD      Future Appointments            In 3 months Silvio Pate, Theophilus Kinds, MD [employer] at [place], [facility]

## 2017-12-06 NOTE — Telephone Encounter (Signed)
Name of Medication: oxycodone 10 mg Name of [facility] [address] Last Fill or Written Date and Quantity: # 120 on 11/07/17 Last Office Visit and Type: 11/26/17 3 mth FU narcotic Next Office Visit and Type:03/20/18 CPX  Last Controlled Substance Agreement Date: 03/09/17 Last UDS:03/09/17

## 2017-12-06 NOTE — Telephone Encounter (Signed)
Copied from [place] [id number]. Topic: Quick Communication - See Telephone Encounter >> Dec 06, 2017 11:41 AM Conception Chancy, NT wrote: CRM for notification. See Telephone encounter for: 12/06/17.  Patient is requesting a refill on Oxycodone HCl 10 MG TABS.  [id number] - [place], [place] - [place] [facility] [place] [address] [place] [address] Phone: [phone] Fax: [phone]

## 2017-12-07 ENCOUNTER — Encounter (INDEPENDENT_AMBULATORY_CARE_PROVIDER_SITE_OTHER): Payer: Self-pay | Admitting: Nurse Practitioner

## 2017-12-07 NOTE — Progress Notes (Signed)
Subjective:    Patient ID: Tina Pacheco, female    DOB: 08-11-1950, 67 y.o.   MRN: [id number] Chief Complaint  Patient presents with  . New Patient (Initial Visit)    ref Tina Pacheco for lymphedema    HPI  Tina Pacheco is a 67 y.o. female that is a referral from Tina Pate, MD regarding Lymphedema.  Tina Pacheco has had asymmetrical left upper extremity and lower extremity edema for approximately 2 years.  Initially it was not bothersome, however now the swelling is progressed to the point where she is unable to lie on her right side due to the heaviness of the edema.  She states that she even has edema in her abdomen, greater so on the left.  She states that her left lower extremity is heavy and she is unable to ambulate without significant effort because of it.  She recently underwent foot surgery in May.  She denies any other surgeries such as lymph node removal, knee replacement, permanent Port-A-Cath placement.  She denies any history of cancer.  She states that she lost approximately 50 pounds and despite the weight loss the heaviness is sustained in the upper and lower left extremities.  Tina Pacheco also has diastolic congestive heart failure, which she states she was placed on furosemide for an exacerbation.  Despite the exacerbation and loop diuretics, the edema persisted.  The patient states that there approximately 1 to 2 inches bigger than the opposite extremity.  She denies any recent chest pain or shortness of breath.  She denies any fever, chills, nausea, vomiting, diarrhea.  She denies any extensive periods of immobility.  She states that this is now becoming a lifestyle limiting problem and would like to discover what the cause may be.  Past Medical History:  Diagnosis Date  . Arthritis    RA  . Asthma   . Basal cell carcinoma   . Cataract 2018   bilateral eyes; corrected with surgery  . Claustrophobia   . COPD (chronic obstructive pulmonary disease) ([place])   . Diastolic  dysfunction    a. echo 07/2014: EF 55-60%, no RWMA, GR2DD, mild MR, LA moderately dilated, PASP 38 mm Hg  . Dyslipidemia   . Dysrhythmia   . Headache    migraines  . Hemihypertrophy   . History of cardiac cath    a. 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 History of gout   . Hyperplastic colonic polyp 2003  . Hypertension   . Hypokalemia   . Morbid obesity ([place])   . PAF (paroxysmal atrial fibrillation) ([facility])    a. on Pradaxa; b. CHADSVASc at least 2 (HTN & female)  . Rheumatoid arthritis(714.0)   . Sleep apnea    a. not compliant with CPAP    Social History   Socioeconomic History  . Marital status: Married    Spouse name: Not on file  . Number of children: 1  . Years of education: Not on file  . Highest education level: Not on file  Occupational History  . 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 Financial resource strain: Not on file  . Food insecurity:    Worry: Not on file    Inability: Not on file  . Transportation needs:    Medical: Not on file    Non-medical: Not on file  Tobacco Use  . Smoking status: Never Smoker  . Smokeless tobacco: Never Used  Substance and Sexual Activity  . Alcohol use: No  . Drug use: No  . Sexual activity: Not on file  Lifestyle  . Physical activity:    Days per week: Not on file    Minutes per session: Not on file  . Stress: Not on file  Relationships  . Social connections:    Talks on phone: Not on file    Gets together: Not on file    Attends religious service: Not on file    Active member of club or organization: Not on file    Attends meetings of clubs or organizations: Not on file    Relationship status: Not on file  . Intimate partner violence:    Fear of current or ex partner: Not on file    Emotionally abused: Not on file    Physically abused: Not on file    Forced sexual activity: Not on file  Other Topics Concern  . Not on file  Social History Narrative     No living will   Husband, then daughter should be decision maker   Would accept resuscitation attempts   Not sure about tube feeds    Past Surgical History:  Procedure Laterality Date  . ABDOMINAL HYSTERECTOMY    . APPLICATION OF WOUND VAC Right 08/09/2017   Procedure: APPLICATION OF WOUND VAC;  Surgeon: Albertine Patricia, DPM;  Location: [facility];  Service: Podiatry;  Laterality: Right;  . CARDIAC CATHETERIZATION  05/24/2010   nonobstructive CAD  . CATARACT EXTRACTION W/ INTRAOCULAR LENS IMPLANT Right 06/12/2016   Dr. Darleen Crocker  . CATARACT EXTRACTION W/ INTRAOCULAR LENS IMPLANT Left 06/26/2016   Dr. Darleen Crocker  . CESAREAN SECTION    . CHOLECYSTECTOMY    . COLONOSCOPY  06/12/2011   Procedure: COLONOSCOPY;  Surgeon: Juanita Craver, MD;  Location: [facility];  Service: Endoscopy;  Laterality: N/A;  . COLONOSCOPY N/A 03/17/2013   Procedure: COLONOSCOPY;  Surgeon: Juanita Craver, MD;  Location: [facility];  Service: Endoscopy;  Laterality: N/A;  . EYE SURGERY    . FOOT ARTHRODESIS Right 07/13/2017   Procedure: ARTHRODESIS FOOT-MULTI.FUSIONS (6 JOINTS);  Surgeon: Albertine Patricia, DPM;  Location: [facility];  Service: Podiatry;  Laterality: Right;  . IRRIGATION AND DEBRIDEMENT FOOT Right 08/09/2017   Procedure: IRRIGATION AND DEBRIDEMENT FOOT;  Surgeon: Albertine Patricia, DPM;  Location: [facility];  Service: Podiatry;  Laterality: Right;  . KNEE ARTHROSCOPY     bilateral  . SINUSOTOMY    . TOOTH EXTRACTION  12/2016  . VAGINAL HYSTERECTOMY      Family History  Problem Relation Age of Onset  . Emphysema Father        smoked  . Tuberculosis Mother   . Parkinsonism Mother   . Diabetes type II Sister   . Breast cancer Sister   . Breast cancer Maternal Aunt   . Tuberculosis Sister     No Known Allergies   Review of Systems   Review of Systems: Negative Unless Checked Constitutional: [] Weight loss  [] Fever  [] Chills Cardiac: [] Chest pain   [x]  Atrial Fibrillation   [] Palpitations   [] Shortness of breath when laying flat   [] Shortness of breath with exertion. Vascular:  [] Pain in legs with walking   [] Pain in legs with standing  [] History of DVT   [] Phlebitis   [x] Swelling in legs   [x] Varicose veins   [] Non-healing ulcers Pulmonary:   [] Uses home oxygen   [] Productive cough   [] Hemoptysis   [] Wheeze  [] COPD   [] Asthma Neurologic:  []   Dizziness   [] Seizures   [] History of stroke   [] History of TIA  [] Aphasia   [] Vissual changes   [] Weakness or numbness in arm   [x] Weakness or numbness in leg Musculoskeletal:   [] Joint swelling   [] Joint pain   [] Low back pain  []  History of Knee Replacement Hematologic:  [] Easy bruising  [] Easy bleeding   [] Hypercoagulable state   [] Anemic Gastrointestinal:  [] Diarrhea   [] Vomiting  [x] Gastroesophageal reflux/heartburn   [] Difficulty swallowing. Genitourinary:  [] Chronic kidney disease   [] Difficult urination  [] Anuric   [] Blood in urine Skin:  [] Rashes   [] Ulcers  Psychological:  [] History of anxiety   []  History of major depression  []  Memory Difficulties     Objective:   Physical Exam  BP (!) 168/80 (BP Location: Right Arm)   Pulse 64   Resp 16   Ht 5\' 3"  (1.6 m)   Wt (!) 306 lb (138.8 kg)   BMI 54.21 kg/m   Gen: WD/WN, NAD Head: [place]/AT, No temporalis wasting.  Ear/Nose/Throat: Hearing grossly intact, nares w/o erythema or drainage Eyes: PER, EOMI, sclera nonicteric.  Neck: Supple, no masses.  No JVD.  Pulmonary:  Good air movement, no use of accessory muscles.  Cardiac: RRR Vascular:  Large body habitus, however left upper extremity is wider in diameter in comparison to right upper extremity.  The left lower extremity is also more edematous and more enlarged than her right lower extremity.  2+ pitting edema on the left lower extremity.  2+ soft edema on the left upper extremity. Vessel Right Left  Radial Palpable Palpable   Gastrointestinal: soft, non-distended. No guarding/no peritoneal signs.   Musculoskeletal: M/S 5/5 throughout.  No deformity or atrophy.  Uses walker to ambulate Neurologic: Pain and light touch intact in extremities.  Symmetrical.  Speech is fluent. Motor exam as listed above. Psychiatric: Judgment intact, Mood & affect appropriate for pt's clinical situation. Dermatologic: No Venous rashes. No Ulcers Noted.  No changes consistent with cellulitis. Lymph : Dermal thickening on Left lower extremity       Assessment & Plan:   1. Lymphedema I have had a long discussion with the patient regarding swelling and why it  causes symptoms.  Patient will begin wearing graduated compression stockings class 1 (20-30 mmHg) on a daily basis a prescription was given. The patient will  beginning wearing the stockings first thing in the morning and removing them in the evening. The patient is instructed specifically not to sleep in the stockings.   In addition, behavioral modification will be initiated.  This will include frequent elevation, use of over the counter pain medications and exercise such as walking.  I have reviewed systemic causes for chronic edema such as liver, kidney and cardiac etiologies.  The patient has chronic diastolic heart failure, which she has been diuresed for lately without significant changes of the asymmetrical edema.  Consideration for a lymph pump will also be made based upon the effectiveness of conservative therapy.  This would help to improve the edema control and prevent sequela such as ulcers and infections   Patient should undergo duplex ultrasound of the venous system to ensure that DVT or reflux is not present.  Also because the patient's presentation of lymphedema is not typical, if the ultrasounds are unrevealing as to a cause of her asymmetrical edema, we may explore further causes.  In order to explore these causes a CT scan as well as blood work including a CMP, B6, B12 may be ordered.  I will discuss these options with the patient upon  follow-up depending on results of the ultrasound.   The patient will follow-up with me after the ultrasound.   - VAS [place] UPPER EXTREMITY VENOUS DUPLEX; Future - VAS [place] LOWER EXTREMITY VENOUS REFLUX; Future  2. Essential hypertension Continue antihypertensive medications as already ordered, these medications have been reviewed and there are no changes at this time.   3. Osteoarthritis, unspecified osteoarthritis type, unspecified site Continue NSAID medications as already ordered, these medications have been reviewed and there are no changes at this time.  Continued activity and therapy was stressed.    Current Outpatient Medications on File Prior to Visit  Medication Sig Dispense Refill  . amLODipine (NORVASC) 5 MG tablet Take 5 mgs by mouth once daily in the evening 90 tablet 3  . atorvastatin (LIPITOR) 20 MG tablet Take 1 tablet (20 mg total) by mouth every evening.    Marland Kitchen atorvastatin (LIPITOR) 20 MG tablet TAKE 1 TABLET DAILY AT 6PM 90 tablet 1  . diphenhydrAMINE (BENADRYL) 25 MG tablet Take 25 mg by mouth daily as needed for allergies.    . febuxostat (ULORIC) 40 MG tablet Take 40 mg by mouth daily.    . folic acid (FOLVITE) 1 MG tablet Take 1 mg by mouth daily.    . hydroxychloroquine (PLAQUENIL) 200 MG tablet Take 200 mg by mouth 2 (two) times daily.     Marland Kitchen KLOR-CON M20 20 MEQ tablet TAKE 1/2 TABLET (=10MEQ)   TWO TIMES A DAY ; MAY TAKE AND EXTRA TABLET WHEN      TAKING TORSEMIDE 180 tablet 3  . leflunomide (ARAVA) 20 MG tablet Take 20 mg by mouth every morning.     . metoprolol succinate (TOPROL-XL) 50 MG 24 hr tablet Take 1 tablet (50 mg total) by mouth every evening. Take with or immediately following a meal.    . montelukast (SINGULAIR) 10 MG tablet TAKE 1 TABLET BY MOUTH AT BEDTIME 90 tablet 0  . Multiple Vitamin (MULTIVITAMIN) tablet Take 1 tablet by mouth daily.     . polyethylene glycol (MIRALAX / GLYCOLAX) packet Take 17 g by mouth daily as needed for mild constipation.  14 each 0  . PRADAXA 150 MG CAPS capsule TAKE 1 CAPSULE TWICE DAILY 180 capsule 3  . pregabalin (LYRICA) 150 MG capsule Take 150 mg by mouth 3 (three) times daily.    . SF 5000 PLUS 1.1 % CREA dental cream Place 1 application onto teeth 2 (two) times daily.  1  . sodium chloride (OCEAN) 0.65 % SOLN nasal spray Place 1 spray into both nostrils as needed for congestion.    Marland Kitchen terazosin (HYTRIN) 10 MG capsule TAKE 1 CAPSULE BY MOUTH AT BEDTIME 90 capsule 3  . tolterodine (DETROL LA) 2 MG 24 hr capsule Take 1 capsule (2 mg total) by mouth daily as needed (bladder spasms). 30 capsule 5  . torsemide (DEMADEX) 20 MG tablet Take 20 mg by mouth twice daily as needed for swelling 60 tablet 3   No current facility-administered medications on file prior to visit.     There are no Patient Instructions on file for this visit. No follow-ups on file.   Kris Hartmann, NP

## 2017-12-12 ENCOUNTER — Ambulatory Visit (INDEPENDENT_AMBULATORY_CARE_PROVIDER_SITE_OTHER): Payer: Medicare Other | Admitting: Nurse Practitioner

## 2017-12-12 ENCOUNTER — Ambulatory Visit (INDEPENDENT_AMBULATORY_CARE_PROVIDER_SITE_OTHER): Payer: Medicare Other

## 2017-12-12 ENCOUNTER — Encounter (INDEPENDENT_AMBULATORY_CARE_PROVIDER_SITE_OTHER): Payer: Self-pay | Admitting: Nurse Practitioner

## 2017-12-12 VITALS — BP 153/69 | HR 58 | Resp 18 | Ht 66.0 in | Wt 295.0 lb

## 2017-12-12 DIAGNOSIS — I890 Lymphedema, not elsewhere classified: Secondary | ICD-10-CM

## 2017-12-12 DIAGNOSIS — R600 Localized edema: Secondary | ICD-10-CM | POA: Diagnosis not present

## 2017-12-12 DIAGNOSIS — R1907 Generalized intra-abdominal and pelvic swelling, mass and lump: Secondary | ICD-10-CM | POA: Diagnosis not present

## 2017-12-16 ENCOUNTER — Encounter (INDEPENDENT_AMBULATORY_CARE_PROVIDER_SITE_OTHER): Payer: Self-pay | Admitting: Nurse Practitioner

## 2017-12-16 DIAGNOSIS — G8929 Other chronic pain: Secondary | ICD-10-CM | POA: Insufficient documentation

## 2017-12-16 DIAGNOSIS — M79606 Pain in leg, unspecified: Secondary | ICD-10-CM

## 2017-12-16 NOTE — Progress Notes (Signed)
Subjective:    Patient ID: Tina Pacheco, female    DOB: 1950-12-30, 67 y.o.   MRN: [id number] Chief Complaint  Patient presents with  . Follow-up    LLE Venous reflux and UE reflux follow up     HPI  Tina Pacheco is a 67 y.o. female that is a referral from Silvio Pate, MD regarding Lymphedema.  Tina Pacheco has had asymmetrical left upper extremity and lower extremity edema for approximately 2 years.  Initially it was not bothersome, however now the swelling is progressed to the point where she is unable to lie on her right side due to the heaviness of the edema.  She states that she even has edema in her abdomen, greater so on the left.  She states that her left lower extremity is heavy and she is unable to ambulate without significant effort because of it.  She recently underwent foot surgery in May.  She denies any other surgeries such as lymph node removal, knee replacement, permanent Port-A-Cath placement.  She denies any history of cancer.  She states that she lost approximately 50 pounds and despite the weight loss the heaviness is sustained in the upper and lower left extremities.  Tina Pacheco also has diastolic congestive heart failure, which she states she was placed on furosemide for an exacerbation.  Despite the exacerbation and loop diuretics, the edema persisted.  The patient states that there approximately 1 to 2 inches bigger than the opposite extremity.  She denies any recent chest pain or shortness of breath.  She denies any fever, chills, nausea, vomiting, diarrhea.  She denies any extensive periods of immobility.  She states that this is now becoming a lifestyle limiting problem and would like to discover what the cause may be.  Today Tina Pacheco underwent a upper extremity venous study which revealed no evidence of DVT in the upper extremity no evidence of superficial venous thrombosis in the upper extremity either.  She also underwent a lower extremity venous reflux study  of her left lower extremity which revealed no evidence of DVT or superficial venous thrombosis.  She has a 3.84 cm Baker's cyst in the medial popliteal space.  Full compressibility of vessels in her upper and lower extremity.  Past Medical History:  Diagnosis Date  . Arthritis    RA  . Asthma   . Basal cell carcinoma   . Cataract 2018   bilateral eyes; corrected with surgery  . Claustrophobia   . COPD (chronic obstructive pulmonary disease) ([place])   . Diastolic dysfunction    a. echo 07/2014: EF 55-60%, no RWMA, GR2DD, mild MR, LA moderately dilated, PASP 38 mm Hg  . Dyslipidemia   . Dysrhythmia   . Headache    migraines  . Hemihypertrophy   . History of cardiac cath    a. 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 History of gout   . Hyperplastic colonic polyp 2003  . Hypertension   . Hypokalemia   . Morbid obesity ([place])   . PAF (paroxysmal atrial fibrillation) ([facility])    a. on Pradaxa; b. CHADSVASc at least 2 (HTN & female)  . Rheumatoid arthritis(714.0)   . Sleep apnea    a. not compliant with CPAP    Social History   Socioeconomic History  . Marital status: Married    Spouse name: Not on file  . Number of children: 1  . Years of education: Not on file  . Highest education level: Not on file  Occupational History  . 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 Financial resource strain: Not on file  . Food insecurity:    Worry: Not on file    Inability: Not on file  . Transportation needs:    Medical: Not on file    Non-medical: Not on file  Tobacco Use  . Smoking status: Never Smoker  . Smokeless tobacco: Never Used  Substance and Sexual Activity  . Alcohol use: No  . Drug use: No  . Sexual activity: Not on file  Lifestyle  . Physical activity:    Days per week: Not on file    Minutes per session: Not on file  . Stress: Not on file  Relationships  . Social connections:    Talks on phone: Not on file     Gets together: Not on file    Attends religious service: Not on file    Active member of club or organization: Not on file    Attends meetings of clubs or organizations: Not on file    Relationship status: Not on file  . Intimate partner violence:    Fear of current or ex partner: Not on file    Emotionally abused: Not on file    Physically abused: Not on file    Forced sexual activity: Not on file  Other Topics Concern  . Not on file  Social History Narrative   No living will   Husband, then daughter should be decision maker   Would accept resuscitation attempts   Not sure about tube feeds    Past Surgical History:  Procedure Laterality Date  . ABDOMINAL HYSTERECTOMY    . APPLICATION OF WOUND VAC Right 08/09/2017   Procedure: APPLICATION OF WOUND VAC;  Surgeon: Albertine Patricia, DPM;  Location: [facility];  Service: Podiatry;  Laterality: Right;  . CARDIAC CATHETERIZATION  05/24/2010   nonobstructive CAD  . CATARACT EXTRACTION W/ INTRAOCULAR LENS IMPLANT Right 06/12/2016   Dr. Darleen Crocker  . CATARACT EXTRACTION W/ INTRAOCULAR LENS IMPLANT Left 06/26/2016   Dr. Darleen Crocker  . CESAREAN SECTION    . CHOLECYSTECTOMY    . COLONOSCOPY  06/12/2011   Procedure: COLONOSCOPY;  Surgeon: Juanita Craver, MD;  Location: [facility];  Service: Endoscopy;  Laterality: N/A;  . COLONOSCOPY N/A 03/17/2013   Procedure: COLONOSCOPY;  Surgeon: Juanita Craver, MD;  Location: [facility];  Service: Endoscopy;  Laterality: N/A;  . EYE SURGERY    . FOOT ARTHRODESIS Right 07/13/2017   Procedure: ARTHRODESIS FOOT-MULTI.FUSIONS (6 JOINTS);  Surgeon: Albertine Patricia, DPM;  Location: [facility];  Service: Podiatry;  Laterality: Right;  . IRRIGATION AND DEBRIDEMENT FOOT Right 08/09/2017   Procedure: IRRIGATION AND DEBRIDEMENT FOOT;  Surgeon: Albertine Patricia, DPM;  Location: [facility];  Service: Podiatry;  Laterality: Right;  . KNEE ARTHROSCOPY     bilateral  . SINUSOTOMY    . TOOTH EXTRACTION  12/2016  .  VAGINAL HYSTERECTOMY      Family History  Problem Relation Age of Onset  . Emphysema Father        smoked  . Tuberculosis Mother   . Parkinsonism Mother   . Diabetes type II Sister   . Breast cancer Sister   . Breast cancer Maternal Aunt   . Tuberculosis Sister     No Known Allergies   Review of Systems   Review of Systems: Negative Unless Checked Constitutional: [x] Weight loss  [] Fever  [] Chills Cardiac: []   Chest pain   [x]  Atrial Fibrillation  [] Palpitations   [] Shortness of breath when laying flat   [] Shortness of breath with exertion. Vascular:  [] Pain in legs with walking   [x] Pain in legs with standing  [] History of DVT   [] Phlebitis   [x] Swelling in legs   [x] Varicose veins   [] Non-healing ulcers Pulmonary:   [] Uses home oxygen   [] Productive cough   [] Hemoptysis   [] Wheeze  [] COPD   [] Asthma Neurologic:  [] Dizziness   [] Seizures   [] History of stroke   [] History of TIA  [] Aphasia   [] Vissual changes   [] Weakness or numbness in arm   [x] Weakness or numbness in leg Musculoskeletal:   [] Joint swelling   [] Joint pain   [] Low back pain  []  History of Knee Replacement Hematologic:  [] Easy bruising  [] Easy bleeding   [] Hypercoagulable state   [] Anemic Gastrointestinal:  [] Diarrhea   [] Vomiting  [x] Gastroesophageal reflux/heartburn   [] Difficulty swallowing. Genitourinary:  [] Chronic kidney disease   [] Difficult urination  [] Anuric   [] Blood in urine Skin:  [] Rashes   [] Ulcers  Psychological:  [] History of anxiety   []  History of major depression  []  Memory Difficulties     Objective:   Physical Exam  BP (!) 153/69 (BP Location: Right Arm, Patient Position: Sitting)   Pulse (!) 58   Resp 18   Ht 5\' 6"  (1.676 m)   Wt 295 lb (133.8 kg)   BMI 47.61 kg/m   Gen: WD/WN, NAD Head: [place]/AT, No temporalis wasting.  Ear/Nose/Throat: Hearing grossly intact, nares w/o erythema or drainage Eyes: PER, EOMI, sclera nonicteric.  Neck: Supple, no masses.  No JVD.  Pulmonary:  Good air  movement, no use of accessory muscles.  Cardiac: RRR Vascular:  Large body habitus, however left upper extremity is wider in diameter in comparison to right upper extremity.  The left lower extremity is also more edematous and more enlarged than her right lower extremity.  2+ pitting edema on the left lower extremity.  2+ soft edema on the left upper extremity. Vessel Right Left  Radial Palpable Palpable   Gastrointestinal: More firm to palpation on the left.. No guarding/no peritoneal signs.  Musculoskeletal: M/S 5/5 throughout.  No deformity or atrophy.  Uses walker to ambulate Neurologic: Pain and light touch intact in extremities.  Symmetrical.  Speech is fluent. Motor exam as listed above. Psychiatric: Judgment intact, Mood & affect appropriate for pt's clinical situation. Dermatologic: No Venous rashes. No Ulcers Noted.  No changes consistent with cellulitis. Lymph : Dermal thickening on Left lower extremity       Assessment & Plan:   1. Lymphedema of left upper extremity Today Tina Pacheco underwent a upper extremity venous study which revealed no evidence of DVT in the upper extremity no evidence of superficial venous thrombosis in the upper extremity either.  She also underwent a lower extremity venous reflux study of her left lower extremity which revealed no evidence of DVT or superficial venous thrombosis.  She has a 3.84 cm Baker's cyst in the medial popliteal space.  Full compressibility of vessels in her upper and lower extremity.  I have spoken with Tina Pacheco about utilizing conservative therapy and the interim.  I spoke with her about compression sleeves of the upper extremity as well as elevation.    2. Lymphedema of left lower extremity  She also underwent a lower extremity venous reflux study of her left lower extremity which revealed no evidence of DVT or superficial venous thrombosis.  She  has a 3.84 cm Baker's cyst in the medial popliteal space.  Full  compressibility of vessels in her upper and lower extremity.  Recommend:  No surgery or intervention at this point in time.    I have reviewed my previous discussion with the patient regarding swelling and why it causes symptoms.  Patient will continue wearing graduated compression stockings class 1 (20-30 mmHg) on a daily basis. The patient will  beginning wearing the stockings first thing in the morning and removing them in the evening. The patient is instructed specifically not to sleep in the stockings.    In addition, behavioral modification including several periods of elevation of the lower extremities during the day will be continued.  This was reviewed with the patient during the initial visit.  The patient will also continue routine exercise, especially walking on a daily basis as was discussed during the initial visit.    Despite conservative treatments including graduated compression therapy class 1 and behavioral modification including exercise and elevation the patient  has not obtained adequate control of the lymphedema.  The patient still has stage 3 lymphedema and therefore, I believe that a lymph pump should be added to improve the control of the patient's lymphedema.  Additionally, a lymph pump is warranted because it will reduce the risk of cellulitis and ulceration in the future.  Patient should follow-up in six months     3. Localized edema  We will obtain blood work to try to ascertain if there are any other physiological reasons to explain the asymmetrical edema.  We will follow patient once the results are obtained.   - COMPLETE METABOLIC PANEL WITH GFR; Future - TSH; Future - CBC with Differential; Future  4. Generalized intra-abdominal and pelvic swelling, mass and lump  Ultrasound has not revealed reason as to why the patient may be suffering from asymmetrical swelling and edema.  Although the patient does have a history of heart failure, typically edema is  not seen in the arms and it is not asymmetrical.  The patient has been ruled out for DVTs via ultrasound.  We will obtain a CT of her abdomen and pelvis as well as her chest to determine if there are any other possible causes for her asymmetrical swelling.  We will follow-up with patient after.  - CT Angio Chest W/Cm &/Or Wo Cm; Future - CT Angio Abd/Pel w/ and/or w/o; Future    Current Outpatient Medications on File Prior to Visit  Medication Sig Dispense Refill  . amLODipine (NORVASC) 5 MG tablet Take 5 mgs by mouth once daily in the evening 90 tablet 3  . atorvastatin (LIPITOR) 20 MG tablet TAKE 1 TABLET DAILY AT 6PM 90 tablet 1  . diphenhydrAMINE (BENADRYL) 25 MG tablet Take 25 mg by mouth daily as needed for allergies.    . febuxostat (ULORIC) 40 MG tablet Take 40 mg by mouth daily.    . folic acid (FOLVITE) 1 MG tablet Take 1 mg by mouth daily.    Marland Kitchen gabapentin (NEURONTIN) 300 MG capsule   5  . hydroxychloroquine (PLAQUENIL) 200 MG tablet Take 200 mg by mouth 2 (two) times daily.     Marland Kitchen KLOR-CON M20 20 MEQ tablet TAKE 1/2 TABLET (=10MEQ)   TWO TIMES A DAY ; MAY TAKE AND EXTRA TABLET WHEN      TAKING TORSEMIDE 180 tablet 3  . leflunomide (ARAVA) 20 MG tablet Take 20 mg by mouth every morning.     . metoprolol succinate (TOPROL-XL)  50 MG 24 hr tablet Take 1 tablet (50 mg total) by mouth every evening. Take with or immediately following a meal.    . montelukast (SINGULAIR) 10 MG tablet TAKE 1 TABLET BY MOUTH AT BEDTIME 90 tablet 0  . Multiple Vitamin (MULTIVITAMIN) tablet Take 1 tablet by mouth daily.     . Oxycodone HCl 10 MG TABS Take 1-2 tablets (10-20 mg total) by mouth 2 (two) times daily as needed. 120 tablet 0  . polyethylene glycol (MIRALAX / GLYCOLAX) packet Take 17 g by mouth daily as needed for mild constipation. 14 each 0  . PRADAXA 150 MG CAPS capsule TAKE 1 CAPSULE TWICE DAILY 180 capsule 3  . pregabalin (LYRICA) 150 MG capsule Take 150 mg by mouth 3 (three) times daily.    .  SF 5000 PLUS 1.1 % CREA dental cream Place 1 application onto teeth 2 (two) times daily.  1  . sodium chloride (OCEAN) 0.65 % SOLN nasal spray Place 1 spray into both nostrils as needed for congestion.    Marland Kitchen terazosin (HYTRIN) 10 MG capsule TAKE 1 CAPSULE BY MOUTH AT BEDTIME 90 capsule 3  . tolterodine (DETROL LA) 2 MG 24 hr capsule Take 1 capsule (2 mg total) by mouth daily as needed (bladder spasms). 30 capsule 5  . torsemide (DEMADEX) 20 MG tablet Take 20 mg by mouth twice daily as needed for swelling 60 tablet 3   No current facility-administered medications on file prior to visit.     There are no Patient Instructions on file for this visit. No follow-ups on file.   Kris Hartmann, NP

## 2017-12-19 DIAGNOSIS — M79671 Pain in right foot: Secondary | ICD-10-CM | POA: Diagnosis not present

## 2017-12-26 DIAGNOSIS — M79671 Pain in right foot: Secondary | ICD-10-CM | POA: Diagnosis not present

## 2017-12-28 ENCOUNTER — Other Ambulatory Visit
Admission: RE | Admit: 2017-12-28 | Discharge: 2017-12-28 | Disposition: A | Discharge status: Home / Self Care | Payer: Medicare Other | Source: Ambulatory Visit | Attending: Nurse Practitioner | Admitting: Nurse Practitioner

## 2017-12-28 DIAGNOSIS — R600 Localized edema: Secondary | ICD-10-CM

## 2017-12-28 DIAGNOSIS — M79671 Pain in right foot: Secondary | ICD-10-CM | POA: Diagnosis not present

## 2017-12-28 LAB — CBC WITH DIFFERENTIAL/PLATELET
Abs Immature Granulocytes: 0.01 10*3/uL (ref 0.00–0.07)
BASOS ABS: 0.1 10*3/uL (ref 0.0–0.1)
Basophils Relative: 2 %
EOS ABS: 0.4 10*3/uL (ref 0.0–0.5)
EOS PCT: 10 %
HEMATOCRIT: 40.8 % (ref 36.0–46.0)
Hemoglobin: 12.3 g/dL (ref 12.0–15.0)
Immature Granulocytes: 0 %
LYMPHS ABS: 1.2 10*3/uL (ref 0.7–4.0)
Lymphocytes Relative: 30 %
MCH: 26.3 pg (ref 26.0–34.0)
MCHC: 30.1 g/dL (ref 30.0–36.0)
MCV: 87.2 fL (ref 80.0–100.0)
Monocytes Absolute: 0.3 10*3/uL (ref 0.1–1.0)
Monocytes Relative: 8 %
Neutro Abs: 1.9 10*3/uL (ref 1.7–7.7)
Neutrophils Relative %: 50 %
Platelets: 198 10*3/uL (ref 150–400)
RBC: 4.68 MIL/uL (ref 3.87–5.11)
RDW: 16.1 % — AB (ref 11.5–15.5)
WBC: 3.9 10*3/uL — AB (ref 4.0–10.5)
nRBC: 0 % (ref 0.0–0.2)

## 2017-12-28 LAB — COMPREHENSIVE METABOLIC PANEL
ALK PHOS: 105 U/L (ref 38–126)
ALT: 10 U/L (ref 0–44)
AST: 18 U/L (ref 15–41)
Albumin: 3.9 g/dL (ref 3.5–5.0)
Anion gap: 9 (ref 5–15)
BILIRUBIN TOTAL: 1 mg/dL (ref 0.3–1.2)
BUN: 16 mg/dL (ref 8–23)
CALCIUM: 10 mg/dL (ref 8.9–10.3)
CO2: 25 mmol/L (ref 22–32)
Chloride: 106 mmol/L (ref 98–111)
Creatinine, Ser: 0.76 mg/dL (ref 0.44–1.00)
GFR calc non Af Amer: >60 mL/min (ref >60)
GLUCOSE: 93 mg/dL (ref 70–99)
Potassium: 4.3 mmol/L (ref 3.5–5.1)
Sodium: 140 mmol/L (ref 135–145)
TOTAL PROTEIN: 7.5 g/dL (ref 6.5–8.1)

## 2017-12-28 LAB — TSH: TSH: 4.149 u[IU]/mL (ref 0.350–4.500)

## 2017-12-28 NOTE — Addendum Note (Signed)
Addended by: Santiago Bur on: 12/28/2017 11:15 AM   Modules accepted: Orders

## 2017-12-31 DIAGNOSIS — M79671 Pain in right foot: Secondary | ICD-10-CM | POA: Diagnosis not present

## 2018-01-01 ENCOUNTER — Other Ambulatory Visit: Payer: Self-pay | Admitting: Family Medicine

## 2018-01-02 NOTE — Telephone Encounter (Signed)
Approved 1 year

## 2018-01-10 ENCOUNTER — Other Ambulatory Visit: Payer: Self-pay | Admitting: Internal Medicine

## 2018-01-10 MED ORDER — OXYCODONE HCL 10 MG PO TABS: 10.0000 mg | ORAL_TABLET | Freq: Two times a day (BID) | ORAL | 0 refills | Status: DC | PRN

## 2018-01-10 NOTE — Telephone Encounter (Signed)
° ° ° °  1. Which medications need to be refilled? (please list name of each medication and dose if known) oxycodone   2. Which pharmacy/location (including street and city if local pharmacy) is medication to be sent to?[facility]

## 2018-01-10 NOTE — Telephone Encounter (Signed)
Name of Medication: Oxycodone Name of Pharmacy: [place]. [place] or Written Date and Quantity: 12-06-17 #120 Last Office Visit and Type: 11-26-17 3 Month F/U Next Office Visit and Type: 03-20-18 3 Month F/U Last Controlled Substance Agreement Date: 03-09-17 Last UDS: 03-09-17

## 2018-01-14 ENCOUNTER — Other Ambulatory Visit: Payer: Self-pay

## 2018-01-16 ENCOUNTER — Ambulatory Visit
Admission: RE | Admit: 2018-01-16 | Discharge: 2018-01-16 | Disposition: A | Discharge status: Home / Self Care | Payer: Medicare Other | Source: Ambulatory Visit | Attending: Nurse Practitioner | Admitting: Nurse Practitioner

## 2018-01-16 DIAGNOSIS — I700 Atherosclerosis of aorta: Secondary | ICD-10-CM | POA: Diagnosis not present

## 2018-01-16 DIAGNOSIS — R1907 Generalized intra-abdominal and pelvic swelling, mass and lump: Secondary | ICD-10-CM | POA: Diagnosis not present

## 2018-01-16 DIAGNOSIS — J479 Bronchiectasis, uncomplicated: Secondary | ICD-10-CM | POA: Insufficient documentation

## 2018-01-16 DIAGNOSIS — J8410 Pulmonary fibrosis, unspecified: Secondary | ICD-10-CM | POA: Diagnosis not present

## 2018-01-16 HISTORY — DX: Systemic involvement of connective tissue, unspecified: M35.9

## 2018-01-16 MED ORDER — IOPAMIDOL (ISOVUE-370) INJECTION 76%
125.0000 mL | Freq: Once | INTRAVENOUS | Status: AC | PRN
  Administered 2018-01-16: 125 mL via INTRAVENOUS

## 2018-01-16 NOTE — Progress Notes (Signed)
MRN : [id number]  Tina Pacheco is a 67 y.o. (Jul 15, 1950) female who presents with chief complaint of No chief complaint on file. Marland Kitchen  History of Present Illness: The patient returns to the office for followup evaluation regarding leg swelling and review of the CT scan.  The swelling has persisted and the pain associated with swelling continues to be a major issue for the patient.  There have not been any interval development of a ulcerations or wounds.  Since the previous visit the patient has been wearing graduated compression stockings and has noted little if any improvement in the lymphedema. The patient has been using compression routinely morning until night.  The patient also states elevation during the day and exercise is being done too.  CT scan is reviewed by me and shows the following there are no significant vascular issues identified, there is no significant pelvic or abdominal lymphadenopathy, there are no masses or other obstructing lesions that would explain her lymphedema.  The positive findings are of an enlarged heart and pulmonary fibrosis both of which contribute to right-sided heart failure and likely her chronic lymphedema based on this problem.  Review of her complete metabolic panel demonstrates normal renal function and liver function albumin is within the normal range.  No abnormalities to suggest contribution to her lymphedema.   No outpatient medications have been marked as taking for the 01/17/18 encounter (Appointment) with Delana Meyer, Dolores Lory, MD.    Past Medical History:  Diagnosis Date  . Arthritis    RA  . Asthma   . Basal cell carcinoma   . Cataract 2018   bilateral eyes; corrected with surgery  . Claustrophobia   . Collagen vascular disease (HCC)    RA  . COPD (chronic obstructive pulmonary disease) ([place])   . Diastolic dysfunction    a. echo 07/2014: EF 55-60%, no RWMA, GR2DD, mild MR, LA moderately dilated, PASP 38 mm Hg  . Dyslipidemia   .  Dysrhythmia   . Headache    migraines  . Hemihypertrophy   . History of cardiac cath    a. 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 History of gout   . Hyperplastic colonic polyp 2003  . Hypertension   . Hypokalemia   . Morbid obesity ([place])   . PAF (paroxysmal atrial fibrillation) (HCC)    a. on Pradaxa; b. CHADSVASc at least 2 (HTN & female)  . Rheumatoid arthritis(714.0)   . Sleep apnea    a. not compliant with CPAP    Past Surgical History:  Procedure Laterality Date  . ABDOMINAL HYSTERECTOMY    . APPLICATION OF WOUND VAC Right 08/09/2017   Procedure: APPLICATION OF WOUND VAC;  Surgeon: Albertine Patricia, DPM;  Location: [facility];  Service: Podiatry;  Laterality: Right;  . CARDIAC CATHETERIZATION  05/24/2010   nonobstructive CAD  . CATARACT EXTRACTION W/ INTRAOCULAR LENS IMPLANT Right 06/12/2016   Dr. Darleen Crocker  . CATARACT EXTRACTION W/ INTRAOCULAR LENS IMPLANT Left 06/26/2016   Dr. Darleen Crocker  . CESAREAN SECTION    . CHOLECYSTECTOMY    . COLONOSCOPY  06/12/2011   Procedure: COLONOSCOPY;  Surgeon: Juanita Craver, MD;  Location: [facility];  Service: Endoscopy;  Laterality: N/A;  . COLONOSCOPY N/A 03/17/2013   Procedure: COLONOSCOPY;  Surgeon: Juanita Craver, MD;  Location: [facility];  Service: Endoscopy;  Laterality: N/A;  . EYE SURGERY    . FOOT ARTHRODESIS Right 07/13/2017   Procedure: ARTHRODESIS FOOT-MULTI.FUSIONS (6 JOINTS);  Surgeon: Albertine Patricia,  DPM;  Location: [facility];  Service: Podiatry;  Laterality: Right;  . IRRIGATION AND DEBRIDEMENT FOOT Right 08/09/2017   Procedure: IRRIGATION AND DEBRIDEMENT FOOT;  Surgeon: Albertine Patricia, DPM;  Location: [facility];  Service: Podiatry;  Laterality: Right;  . KNEE ARTHROSCOPY     bilateral  . SINUSOTOMY    . TOOTH EXTRACTION  12/2016  . VAGINAL HYSTERECTOMY      Social History Social History   Tobacco Use  . Smoking status: Never Smoker  . Smokeless tobacco: Never Used  Substance Use Topics  . Alcohol  use: No  . Drug use: No    Family History Family History  Problem Relation Age of Onset  . Emphysema Father        smoked  . Tuberculosis Mother   . Parkinsonism Mother   . Diabetes type II Sister   . Breast cancer Sister   . Breast cancer Maternal Aunt   . Tuberculosis Sister     Allergies  Allergen Reactions  . Codeine     Nausea and vomiting     REVIEW OF SYSTEMS (Negative unless checked)  Constitutional: [] Weight loss  [] Fever  [] Chills Cardiac: [] Chest pain   [] Chest pressure   [] Palpitations   [] Shortness of breath when laying flat   [] Shortness of breath with exertion. Vascular:  [] Pain in legs with walking   [x] Pain in legs with dependency [] History of DVT   [] Phlebitis   [x] Swelling in legs   [] Varicose veins   [] Non-healing ulcers Pulmonary:   [] Uses home oxygen   [] Productive cough   [] Hemoptysis   [] Wheeze  [] COPD   [] Asthma Neurologic:  [] Dizziness   [] Seizures   [] History of stroke   [] History of TIA  [] Aphasia   [] Vissual changes   [] Weakness or numbness in arm   [] Weakness or numbness in leg Musculoskeletal:   [] Joint swelling   [] Joint pain   [] Low back pain Hematologic:  [] Easy bruising  [] Easy bleeding   [] Hypercoagulable state   [] Anemic Gastrointestinal:  [] Diarrhea   [] Vomiting  [] Gastroesophageal reflux/heartburn   [] Difficulty swallowing. Genitourinary:  [] Chronic kidney disease   [] Difficult urination  [] Frequent urination   [] Blood in urine Skin:  [] Rashes   [] Ulcers  Psychological:  [] History of anxiety   []  History of major depression.  Physical Examination  There were no vitals filed for this visit. There is no height or weight on file to calculate BMI. Gen: WD/WN, NAD Head: [place]/AT, No temporalis wasting.  Ear/Nose/Throat: Hearing grossly intact, nares w/o erythema or drainage Eyes: PER, EOMI, sclera nonicteric.  Neck: Supple, no large masses.   Pulmonary:  Good air movement, no audible wheezing bilaterally, no use of accessory muscles.    Cardiac: RRR, no JVD Vascular: scattered varicosities present bilaterally.  Mild to moderate venous stasis changes to the legs bilaterally.  2-3+ soft pitting edema Vessel Right Left  Radial Palpable Palpable  Gastrointestinal: Non-distended. No guarding/no peritoneal signs.  Musculoskeletal: M/S 5/5 throughout.  No deformity or atrophy.  Neurologic: CN 2-12 intact. Symmetrical.  Speech is fluent. Motor exam as listed above. Psychiatric: Judgment intact, Mood & affect appropriate for pt's clinical situation. Dermatologic: Venous rashes no ulcers noted.  No changes consistent with cellulitis. Lymph : No lichenification or skin changes of chronic lymphedema.  CBC Lab Results  Component Value Date   WBC 3.9 (L) 12/28/2017   HGB 12.3 12/28/2017   HCT 40.8 12/28/2017   MCV 87.2 12/28/2017   PLT 198 12/28/2017    BMET  Component Value Date/Time   NA 140 12/28/2017 1130   NA 141 03/29/2014 0522   K 4.3 12/28/2017 1130   K 3.6 03/29/2014 0522   CL 106 12/28/2017 1130   CL 111 (H) 03/29/2014 0522   CO2 25 12/28/2017 1130   CO2 24 03/29/2014 0522   GLUCOSE 93 12/28/2017 1130   GLUCOSE 104 (H) 03/29/2014 0522   BUN 16 12/28/2017 1130   BUN 11 03/29/2014 0522   CREATININE 0.76 12/28/2017 1130   CREATININE 0.90 03/29/2014 0522   CREATININE 0.84 10/26/2010 1037   CALCIUM 10.0 12/28/2017 1130   CALCIUM 9.2 03/29/2014 0522   GFRNONAA >60 12/28/2017 1130   GFRNONAA >60 03/29/2014 0522   GFRAA >60 12/28/2017 1130   GFRAA >60 03/29/2014 0522   CrCl cannot be calculated (Unknown ideal weight.).  COAG Lab Results  Component Value Date   INR 1.90 08/08/2017   INR 1.30 08/10/2014   INR 1.1 03/28/2014    Radiology Ct Angio Chest W/cm &/or Wo Cm  Result Date: 01/16/2018 CLINICAL DATA:  Generalized intra-abdominal and pelvic swelling. EXAM: CT ANGIOGRAPHY CHEST, ABDOMEN AND PELVIS TECHNIQUE: Multidetector CT imaging through the chest, abdomen and pelvis was performed using the  standard protocol during bolus administration of intravenous contrast. Multiplanar reconstructed images and MIPs were obtained and reviewed to evaluate the vascular anatomy. CONTRAST:  116mL ISOVUE-370 IOPAMIDOL (ISOVUE-370) INJECTION 76% COMPARISON:  Chest CT 05/13/2015.  Abdominal CT 03/28/2014 FINDINGS: CTA CHEST FINDINGS Cardiovascular: Heart is mildly enlarged. Aorta is normal caliber with maximum diameter in the ascending aorta of 3.6 cm. Mild atherosclerotic change in the distal descending thoracic aorta. Moderate coronary artery calcifications most notable in the left main and left anterior descending coronary arteries. No filling defects in the pulmonary arteries to suggest pulmonary emboli. Mediastinum/Nodes: No mediastinal, hilar, or axillary adenopathy. Few scattered borderline size mediastinal lymph nodes including 11 mm high right paratracheal lymph node, likely reactive. This is stable dating back to prior study from 2017. Lungs/Pleura: Mild airway thickening bilaterally with cylindrical bronchiectasis most notable in the lower lobes. Findings are similar prior study. Early peripheral interstitial thickening/fibrosis in the mid and lower lungs, also stable. No effusions. No suspicious pulmonary nodules or confluent opacities. Musculoskeletal: No acute bony abnormality. Chest wall soft tissues unremarkable. Review of the MIP images confirms the above findings. CTA ABDOMEN AND PELVIS FINDINGS VASCULAR Aorta: Atherosclerotic calcifications.  No aneurysm or dissection. Celiac: Widely patent SMA: Widely patent Renals: Single bilaterally, widely patent IMA: Widely patent Inflow: No aneurysm or dissection.  Mild atherosclerotic change. Veins: Grossly unremarkable. Review of the MIP images confirms the above findings. NON-VASCULAR Hepatobiliary: Small low-density lesion in the right hepatic lobe measures 15 mm and is stable dating back to 2016. Prior cholecystectomy. No biliary ductal dilatation. Pancreas: No  focal abnormality or ductal dilatation. Spleen: No focal abnormality.  Normal size. Adrenals/Urinary Tract: No adrenal abnormality. No focal renal abnormality. No stones or hydronephrosis. Urinary bladder is unremarkable. Stomach/Bowel: Moderate stool burden throughout the colon. Stomach, large and small bowel grossly unremarkable. Appendix not definitively seen. No pericecal inflammation. Lymphatic: No adenopathy. Reproductive: Prior hysterectomy.  No adnexal masses. Other: No free fluid or free air.  No ventral wall hernia. Musculoskeletal: No acute bony abnormality. Degenerative disc disease diffusely throughout the lumbar spine. Review of the MIP images confirms the above findings. IMPRESSION: No evidence of aortic aneurysm or dissection. Mild atherosclerotic disease in the distal descending thoracic aorta and throughout the abdominal aorta and iliac vessels. Mild airway thickening and  lower lobe bronchiectasis. Early peripheral fibrosis. Appearance of the lungs is stable dating back to 2017. No acute findings in the abdomen or pelvis. Moderate stool burden throughout the colon. Electronically Signed   By: Rolm Baptise M.D.   On: 01/16/2018 09:31   Ct Angio Abd/pel W/ And/or W/o  Result Date: 01/16/2018 CLINICAL DATA:  Generalized intra-abdominal and pelvic swelling. EXAM: CT ANGIOGRAPHY CHEST, ABDOMEN AND PELVIS TECHNIQUE: Multidetector CT imaging through the chest, abdomen and pelvis was performed using the standard protocol during bolus administration of intravenous contrast. Multiplanar reconstructed images and MIPs were obtained and reviewed to evaluate the vascular anatomy. CONTRAST:  146mL ISOVUE-370 IOPAMIDOL (ISOVUE-370) INJECTION 76% COMPARISON:  Chest CT 05/13/2015.  Abdominal CT 03/28/2014 FINDINGS: CTA CHEST FINDINGS Cardiovascular: Heart is mildly enlarged. Aorta is normal caliber with maximum diameter in the ascending aorta of 3.6 cm. Mild atherosclerotic change in the distal descending  thoracic aorta. Moderate coronary artery calcifications most notable in the left main and left anterior descending coronary arteries. No filling defects in the pulmonary arteries to suggest pulmonary emboli. Mediastinum/Nodes: No mediastinal, hilar, or axillary adenopathy. Few scattered borderline size mediastinal lymph nodes including 11 mm high right paratracheal lymph node, likely reactive. This is stable dating back to prior study from 2017. Lungs/Pleura: Mild airway thickening bilaterally with cylindrical bronchiectasis most notable in the lower lobes. Findings are similar prior study. Early peripheral interstitial thickening/fibrosis in the mid and lower lungs, also stable. No effusions. No suspicious pulmonary nodules or confluent opacities. Musculoskeletal: No acute bony abnormality. Chest wall soft tissues unremarkable. Review of the MIP images confirms the above findings. CTA ABDOMEN AND PELVIS FINDINGS VASCULAR Aorta: Atherosclerotic calcifications.  No aneurysm or dissection. Celiac: Widely patent SMA: Widely patent Renals: Single bilaterally, widely patent IMA: Widely patent Inflow: No aneurysm or dissection.  Mild atherosclerotic change. Veins: Grossly unremarkable. Review of the MIP images confirms the above findings. NON-VASCULAR Hepatobiliary: Small low-density lesion in the right hepatic lobe measures 15 mm and is stable dating back to 2016. Prior cholecystectomy. No biliary ductal dilatation. Pancreas: No focal abnormality or ductal dilatation. Spleen: No focal abnormality.  Normal size. Adrenals/Urinary Tract: No adrenal abnormality. No focal renal abnormality. No stones or hydronephrosis. Urinary bladder is unremarkable. Stomach/Bowel: Moderate stool burden throughout the colon. Stomach, large and small bowel grossly unremarkable. Appendix not definitively seen. No pericecal inflammation. Lymphatic: No adenopathy. Reproductive: Prior hysterectomy.  No adnexal masses. Other: No free fluid or free  air.  No ventral wall hernia. Musculoskeletal: No acute bony abnormality. Degenerative disc disease diffusely throughout the lumbar spine. Review of the MIP images confirms the above findings. IMPRESSION: No evidence of aortic aneurysm or dissection. Mild atherosclerotic disease in the distal descending thoracic aorta and throughout the abdominal aorta and iliac vessels. Mild airway thickening and lower lobe bronchiectasis. Early peripheral fibrosis. Appearance of the lungs is stable dating back to 2017. No acute findings in the abdomen or pelvis. Moderate stool burden throughout the colon. Electronically Signed   By: Rolm Baptise M.D.   On: 01/16/2018 09:31    Assessment/Plan 1. Lymphedema No surgery or intervention at this point in time.  I have reviewed my discussion with the patient regarding venous insufficiency and why it causes symptoms. I have discussed with the patient the chronic skin changes that accompany venous insufficiency and the long term sequela such as ulceration. Patient will contnue wearing graduated compression stockings on a daily basis, as this has provided excellent control of his edema. The patient will put  the stockings on first thing in the morning and removing them in the evening. The patient is reminded not to sleep in the stockings.  In addition, behavioral modification including elevation during the day will be initiated. Exercise is strongly encouraged.  CT scan of the abdomen and pelvis shows normal lymph nodes no mass no tumors  Enlarged heart and fibrosis of the lungs noted  Given the patient's good control and lack of any problems regarding the venous insufficiency and lymphedema a lymph pump in not need at this time.  The patient will follow up with me PRN should anything change.  The patient voices agreement with this plan.   2. Chronic diastolic heart failure ([place]) Follow up with cardiology  3. Coronary artery disease, non-occlusive Continue cardiac and  antihypertensive medications as already ordered and reviewed, no changes at this time.  Continue statin as ordered and reviewed, no changes at this time  Nitrates PRN for chest pain   4. Essential hypertension Continue antihypertensive medications as already ordered, these medications have been reviewed and there are no changes at this time.   5. PAF (paroxysmal atrial fibrillation) (HCC) Continue antiarrhythmia medications as already ordered, these medications have been reviewed and there are no changes at this time.  Continue anticoagulation as ordered by Cardiology Service     Hortencia Pilar, MD  01/16/2018 10:36 AM

## 2018-01-17 ENCOUNTER — Ambulatory Visit (INDEPENDENT_AMBULATORY_CARE_PROVIDER_SITE_OTHER): Payer: Medicare Other | Admitting: Vascular Surgery

## 2018-01-17 ENCOUNTER — Encounter (INDEPENDENT_AMBULATORY_CARE_PROVIDER_SITE_OTHER): Payer: Self-pay | Admitting: Vascular Surgery

## 2018-01-17 VITALS — BP 127/62 | HR 63 | Resp 20 | Ht 63.0 in | Wt 292.0 lb

## 2018-01-17 DIAGNOSIS — I890 Lymphedema, not elsewhere classified: Secondary | ICD-10-CM

## 2018-01-17 DIAGNOSIS — I2510 Atherosclerotic heart disease of native coronary artery without angina pectoris: Secondary | ICD-10-CM | POA: Diagnosis not present

## 2018-01-17 DIAGNOSIS — I5032 Chronic diastolic (congestive) heart failure: Secondary | ICD-10-CM | POA: Diagnosis not present

## 2018-01-17 DIAGNOSIS — I10 Essential (primary) hypertension: Secondary | ICD-10-CM

## 2018-01-17 DIAGNOSIS — I480 Paroxysmal atrial fibrillation: Secondary | ICD-10-CM

## 2018-01-23 ENCOUNTER — Ambulatory Visit: Payer: Federal, State, Local not specified - PPO | Admitting: Cardiovascular Disease

## 2018-01-24 ENCOUNTER — Other Ambulatory Visit: Payer: Self-pay | Admitting: Internal Medicine

## 2018-02-06 DIAGNOSIS — M792 Neuralgia and neuritis, unspecified: Secondary | ICD-10-CM | POA: Diagnosis not present

## 2018-02-08 ENCOUNTER — Encounter: Payer: Self-pay | Admitting: Internal Medicine

## 2018-02-08 ENCOUNTER — Ambulatory Visit (INDEPENDENT_AMBULATORY_CARE_PROVIDER_SITE_OTHER): Payer: Medicare Other | Admitting: Internal Medicine

## 2018-02-08 VITALS — BP 108/62 | HR 59 | Temp 97.6°F | Ht 63.0 in | Wt 289.0 lb

## 2018-02-08 DIAGNOSIS — I2510 Atherosclerotic heart disease of native coronary artery without angina pectoris: Secondary | ICD-10-CM

## 2018-02-08 DIAGNOSIS — R110 Nausea: Secondary | ICD-10-CM

## 2018-02-08 MED ORDER — MECLIZINE HCL 25 MG PO TABS: 25.0000 mg | ORAL_TABLET | Freq: Three times a day (TID) | ORAL | 11 refills | Status: DC

## 2018-02-08 NOTE — Progress Notes (Signed)
Subjective:    Patient ID: Tina Pacheco, female    DOB: 08/04/1950, 67 y.o.   MRN: [id number]  HPI Here due to nausea Will have spells of feeling bad, can't sleep or eat, nausea Mostly since her foot surgery Some vomiting with first spell--but not since Will last for days or even a week  Gets better--then will recur Uses the promethazine--helps a little Has tried dramamine and pepto--do help some No clear food problems --just can't eat in these spells  No fevers but will have chills with this Just can't sleep First noticed this years ago--but now "ramped up" in past 2 months No new medications  Did have upper stomach upset once (epigastric) But usually no pain  Chronic vertigo--variable Has occurred in bed with turning  Current Outpatient Medications on File Prior to Visit  Medication Sig Dispense Refill  . amLODipine (NORVASC) 5 MG tablet Take 5 mgs by mouth once daily in the evening 90 tablet 3  . atorvastatin (LIPITOR) 20 MG tablet TAKE 1 TABLET DAILY AT 6PM 90 tablet 1  . diphenhydrAMINE (BENADRYL) 25 MG tablet Take 25 mg by mouth daily as needed for allergies.    . febuxostat (ULORIC) 40 MG tablet Take 40 mg by mouth daily.    . folic acid (FOLVITE) 1 MG tablet Take 1 mg by mouth daily.    Marland Kitchen gabapentin (NEURONTIN) 300 MG capsule TAKE 1 CAPSULE BY MOUTH THREE TIMES A DAY(TAKE 1 CAPSULE 2-3 HOURS BEFORE BEDTIME) 90 capsule 5  . hydroxychloroquine (PLAQUENIL) 200 MG tablet Take 200 mg by mouth 2 (two) times daily.     Marland Kitchen KLOR-CON M20 20 MEQ tablet TAKE 1/2 TABLET (=10MEQ)   TWO TIMES A DAY ; MAY TAKE AND EXTRA TABLET WHEN      TAKING TORSEMIDE 180 tablet 3  . leflunomide (ARAVA) 20 MG tablet Take 20 mg by mouth every morning.     . metoprolol succinate (TOPROL-XL) 50 MG 24 hr tablet TAKE 1 TABLET BY MOUTH ONCE DAILY WITH OR IMMEDIATELY FOLLOWING A MEAL 90 tablet 3  . montelukast (SINGULAIR) 10 MG tablet TAKE 1 TABLET BY MOUTH AT BEDTIME 90 tablet 0  . Multiple Vitamin  (MULTIVITAMIN) tablet Take 1 tablet by mouth daily.     . Oxycodone HCl 10 MG TABS Take 1-2 tablets (10-20 mg total) by mouth 2 (two) times daily as needed. 120 tablet 0  . polyethylene glycol (MIRALAX / GLYCOLAX) packet Take 17 g by mouth daily as needed for mild constipation. 14 each 0  . PRADAXA 150 MG CAPS capsule TAKE 1 CAPSULE TWICE DAILY 180 capsule 3  . pregabalin (LYRICA) 150 MG capsule Take 150 mg by mouth 3 (three) times daily.    . promethazine (PHENERGAN) 25 MG tablet TK 1/2 TO 1 T PO TID PRN N  1  . SF 5000 PLUS 1.1 % CREA dental cream Place 1 application onto teeth 2 (two) times daily.  1  . sodium chloride (OCEAN) 0.65 % SOLN nasal spray Place 1 spray into both nostrils as needed for congestion.    Marland Kitchen terazosin (HYTRIN) 10 MG capsule TAKE 1 CAPSULE BY MOUTH AT BEDTIME 90 capsule 3  . tolterodine (DETROL LA) 2 MG 24 hr capsule Take 1 capsule (2 mg total) by mouth daily as needed (bladder spasms). 30 capsule 5  . torsemide (DEMADEX) 20 MG tablet Take 20 mg by mouth twice daily as needed for swelling 60 tablet 3   No current facility-administered medications on file  prior to visit.     Allergies  Allergen Reactions  . Codeine     Nausea and vomiting    Past Medical History:  Diagnosis Date  . Arthritis    RA  . Asthma   . Basal cell carcinoma   . Cataract 2018   bilateral eyes; corrected with surgery  . Claustrophobia   . Collagen vascular disease (HCC)    RA  . COPD (chronic obstructive pulmonary disease) ([place])   . Diastolic dysfunction    a. echo 07/2014: EF 55-60%, no RWMA, GR2DD, mild MR, LA moderately dilated, PASP 38 mm Hg  . Dyslipidemia   . Dysrhythmia   . Headache    migraines  . Hemihypertrophy   . History of cardiac cath    a. 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 History of gout   . Hyperplastic colonic polyp 2003  . Hypertension   . Hypokalemia   . Morbid obesity ([place])   . PAF (paroxysmal atrial fibrillation) (HCC)    a. on Pradaxa; b.  CHADSVASc at least 2 (HTN & female)  . Rheumatoid arthritis(714.0)   . Sleep apnea    a. not compliant with CPAP    Past Surgical History:  Procedure Laterality Date  . ABDOMINAL HYSTERECTOMY    . APPLICATION OF WOUND VAC Right 08/09/2017   Procedure: APPLICATION OF WOUND VAC;  Surgeon: Albertine Patricia, DPM;  Location: [facility];  Service: Podiatry;  Laterality: Right;  . CARDIAC CATHETERIZATION  05/24/2010   nonobstructive CAD  . CATARACT EXTRACTION W/ INTRAOCULAR LENS IMPLANT Right 06/12/2016   Dr. Darleen Crocker  . CATARACT EXTRACTION W/ INTRAOCULAR LENS IMPLANT Left 06/26/2016   Dr. Darleen Crocker  . CESAREAN SECTION    . CHOLECYSTECTOMY    . COLONOSCOPY  06/12/2011   Procedure: COLONOSCOPY;  Surgeon: Juanita Craver, MD;  Location: [facility];  Service: Endoscopy;  Laterality: N/A;  . COLONOSCOPY N/A 03/17/2013   Procedure: COLONOSCOPY;  Surgeon: Juanita Craver, MD;  Location: [facility];  Service: Endoscopy;  Laterality: N/A;  . EYE SURGERY    . FOOT ARTHRODESIS Right 07/13/2017   Procedure: ARTHRODESIS FOOT-MULTI.FUSIONS (6 JOINTS);  Surgeon: Albertine Patricia, DPM;  Location: [facility];  Service: Podiatry;  Laterality: Right;  . IRRIGATION AND DEBRIDEMENT FOOT Right 08/09/2017   Procedure: IRRIGATION AND DEBRIDEMENT FOOT;  Surgeon: Albertine Patricia, DPM;  Location: [facility];  Service: Podiatry;  Laterality: Right;  . KNEE ARTHROSCOPY     bilateral  . SINUSOTOMY    . TOOTH EXTRACTION  12/2016  . VAGINAL HYSTERECTOMY      Family History  Problem Relation Age of Onset  . Emphysema Father        smoked  . Tuberculosis Mother   . Parkinsonism Mother   . Diabetes type II Sister   . Breast cancer Sister   . Breast cancer Maternal Aunt   . Tuberculosis Sister     Social History   Socioeconomic History  . Marital status: Married    Spouse name: Not on file  . Number of children: 1  . Years of education: Not on file  . Highest education level: Not on file  Occupational History   . 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 Financial resource strain: Not on file  . Food insecurity:    Worry: Not on file    Inability: Not on file  . Transportation needs:    Medical: Not  on file    Non-medical: Not on file  Tobacco Use  . Smoking status: Never Smoker  . Smokeless tobacco: Never Used  Substance and Sexual Activity  . Alcohol use: No  . Drug use: No  . Sexual activity: Not on file  Lifestyle  . Physical activity:    Days per week: Not on file    Minutes per session: Not on file  . Stress: Not on file  Relationships  . Social connections:    Talks on phone: Not on file    Gets together: Not on file    Attends religious service: Not on file    Active member of club or organization: Not on file    Attends meetings of clubs or organizations: Not on file    Relationship status: Not on file  . Intimate partner violence:    Fear of current or ex partner: Not on file    Emotionally abused: Not on file    Physically abused: Not on file    Forced sexual activity: Not on file  Other Topics Concern  . Not on file  Social History Narrative   No living will   Husband, then daughter should be decision maker   Would accept resuscitation attempts   Not sure about tube feeds   Review of Systems Has bad motion sickness---her spells remind her of this Sleeps okay if not having the spells---frequent nocturia Bowels are okay No tinnitus or hearing loss    Objective:   Physical Exam  Constitutional: She appears well-developed. No distress.  Neck: No thyromegaly present.  Cardiovascular: Normal rate, regular rhythm and normal heart sounds. Exam reveals no gallop.  No murmur heard. Respiratory: Effort normal and breath sounds normal. No respiratory distress. She has no wheezes. She has no rales.  GI: Soft. She exhibits no distension. There is no abdominal tenderness. There is no rebound and no guarding.    Lymphadenopathy:    She has no cervical adenopathy.           Assessment & Plan:

## 2018-02-08 NOTE — Assessment & Plan Note (Signed)
Doesn't sound GI Actually feels on the verge often---and seeing movement on TV can bring it on---suggesting vestibular No worrisome features Will try meclizine regularly--then prn if works Consider ENT/neurology

## 2018-02-11 ENCOUNTER — Other Ambulatory Visit: Payer: Self-pay | Admitting: *Deleted

## 2018-02-11 MED ORDER — OXYCODONE HCL 10 MG PO TABS: 10.0000 mg | ORAL_TABLET | Freq: Two times a day (BID) | ORAL | 0 refills | Status: DC | PRN

## 2018-02-11 NOTE — Progress Notes (Deleted)
Cardiology Office Note  Date:  02/11/2018   ID:  Tina Pacheco, Rhem May 18, 1950, MRN [id number]  PCP:  Venia Carbon, MD   No chief complaint on file.   HPI:  Ms. Coker is a 67 year old woman with  morbid obesity,  May 23 2010  paroxysmal atrial fibrillation,  echocardiogram showing normal LV function with diastolic dysfunction,  hyperlipidemia, hypertension,  cardiac catheterization May 24 2010 mild nonobstructive coronary artery disease,  asthma/COPD,  suspected sleep apnea,  atrial fibrillation March and July 2012, October 2013.(no symptoms) started on diltiazem infusion and converted to normal sinus rhythm  rheumatoid arthritis  who presents for routine followup of her atrial fibrillation.  Boot on the right foot, "broken bone, arthritis in foot"  Weight trending down, 60 pounds and she is following weight watchers Taking torsemide periodically for leg swelling She does some exercise with her recumbent bike  Denies any tachycardia concerning for atrial fibrillation but she was symptomatic in the past Tolerating anticoagulation  Previous Echo in 2016 with EF 34%, diastolic dysfunction, results reviewed with her  In 2016, no PAD in legs  EKG on today's visit shows normal sinus rhythm with rate 54 bpm, no significant ST or T-wave changes  Other past medical history reviewed Previously, Lisinopril was changed to Norvasc for cough.    fibromyalgia and rheumatoid arthritis  Family previously indicated that they thought that she has obstructive sleep apnea. She does not think that she can sleep with a CPAP. All of her atrial fibrillation episodes have occurred first thing in the morning prior to medications.   lab work showing total cholesterol 114, LDL 55  PMH:   has a past medical history of Arthritis, Asthma, Basal cell carcinoma, Cataract (2018), Claustrophobia, Collagen vascular disease ([place]), COPD (chronic obstructive pulmonary disease)  ([place]), Diastolic dysfunction, Dyslipidemia, Dysrhythmia, Headache, Hemihypertrophy, History of cardiac cath, History of gout, Hyperplastic colonic polyp (2003), Hypertension, Hypokalemia, Morbid obesity ([place]), PAF (paroxysmal atrial fibrillation) ([place]), Rheumatoid arthritis(714.0), and Sleep apnea.  PSH:    Past Surgical History:  Procedure Laterality Date  . ABDOMINAL HYSTERECTOMY    . APPLICATION OF WOUND VAC Right 08/09/2017   Procedure: APPLICATION OF WOUND VAC;  Surgeon: Albertine Patricia, DPM;  Location: [facility];  Service: Podiatry;  Laterality: Right;  . CARDIAC CATHETERIZATION  05/24/2010   nonobstructive CAD  . CATARACT EXTRACTION W/ INTRAOCULAR LENS IMPLANT Right 06/12/2016   Dr. Darleen Crocker  . CATARACT EXTRACTION W/ INTRAOCULAR LENS IMPLANT Left 06/26/2016   Dr. Darleen Crocker  . CESAREAN SECTION    . CHOLECYSTECTOMY    . COLONOSCOPY  06/12/2011   Procedure: COLONOSCOPY;  Surgeon: Juanita Craver, MD;  Location: [facility];  Service: Endoscopy;  Laterality: N/A;  . COLONOSCOPY N/A 03/17/2013   Procedure: COLONOSCOPY;  Surgeon: Juanita Craver, MD;  Location: [facility];  Service: Endoscopy;  Laterality: N/A;  . EYE SURGERY    . FOOT ARTHRODESIS Right 07/13/2017   Procedure: ARTHRODESIS FOOT-MULTI.FUSIONS (6 JOINTS);  Surgeon: Albertine Patricia, DPM;  Location: [facility];  Service: Podiatry;  Laterality: Right;  . IRRIGATION AND DEBRIDEMENT FOOT Right 08/09/2017   Procedure: IRRIGATION AND DEBRIDEMENT FOOT;  Surgeon: Albertine Patricia, DPM;  Location: [facility];  Service: Podiatry;  Laterality: Right;  . KNEE ARTHROSCOPY     bilateral  . SINUSOTOMY    . TOOTH EXTRACTION  12/2016  . VAGINAL HYSTERECTOMY      Current Outpatient Medications  Medication Sig Dispense Refill  . amLODipine (NORVASC) 5 MG tablet Take 5 mgs  by mouth once daily in the evening 90 tablet 3  . atorvastatin (LIPITOR) 20 MG tablet TAKE 1 TABLET DAILY AT 6PM 90 tablet 1  . diphenhydrAMINE (BENADRYL) 25 MG tablet  Take 25 mg by mouth daily as needed for allergies.    . febuxostat (ULORIC) 40 MG tablet Take 40 mg by mouth daily.    . folic acid (FOLVITE) 1 MG tablet Take 1 mg by mouth daily.    Marland Kitchen gabapentin (NEURONTIN) 300 MG capsule TAKE 1 CAPSULE BY MOUTH THREE TIMES A DAY(TAKE 1 CAPSULE 2-3 HOURS BEFORE BEDTIME) 90 capsule 5  . hydroxychloroquine (PLAQUENIL) 200 MG tablet Take 200 mg by mouth 2 (two) times daily.     Marland Kitchen KLOR-CON M20 20 MEQ tablet TAKE 1/2 TABLET (=10MEQ)   TWO TIMES A DAY ; MAY TAKE AND EXTRA TABLET WHEN      TAKING TORSEMIDE 180 tablet 3  . leflunomide (ARAVA) 20 MG tablet Take 20 mg by mouth every morning.     . meclizine (ANTIVERT) 25 MG tablet Take 1 tablet (25 mg total) by mouth 3 (three) times daily. 90 tablet 11  . metoprolol succinate (TOPROL-XL) 50 MG 24 hr tablet TAKE 1 TABLET BY MOUTH ONCE DAILY WITH OR IMMEDIATELY FOLLOWING A MEAL 90 tablet 3  . montelukast (SINGULAIR) 10 MG tablet TAKE 1 TABLET BY MOUTH AT BEDTIME 90 tablet 0  . Multiple Vitamin (MULTIVITAMIN) tablet Take 1 tablet by mouth daily.     . Oxycodone HCl 10 MG TABS Take 1-2 tablets (10-20 mg total) by mouth 2 (two) times daily as needed. 120 tablet 0  . polyethylene glycol (MIRALAX / GLYCOLAX) packet Take 17 g by mouth daily as needed for mild constipation. 14 each 0  . PRADAXA 150 MG CAPS capsule TAKE 1 CAPSULE TWICE DAILY 180 capsule 3  . pregabalin (LYRICA) 150 MG capsule Take 150 mg by mouth 3 (three) times daily.    . promethazine (PHENERGAN) 25 MG tablet TK 1/2 TO 1 T PO TID PRN N  1  . SF 5000 PLUS 1.1 % CREA dental cream Place 1 application onto teeth 2 (two) times daily.  1  . sodium chloride (OCEAN) 0.65 % SOLN nasal spray Place 1 spray into both nostrils as needed for congestion.    Marland Kitchen terazosin (HYTRIN) 10 MG capsule TAKE 1 CAPSULE BY MOUTH AT BEDTIME 90 capsule 3  . tolterodine (DETROL LA) 2 MG 24 hr capsule Take 1 capsule (2 mg total) by mouth daily as needed (bladder spasms). 30 capsule 5  .  torsemide (DEMADEX) 20 MG tablet Take 20 mg by mouth twice daily as needed for swelling 60 tablet 3   No current facility-administered medications for this visit.      Allergies:   Codeine   Social History:  The patient  reports that she has never smoked. She has never used smokeless tobacco. She reports that she does not drink alcohol or use drugs.   Family History:   family history includes Breast cancer in her maternal aunt and sister; Diabetes type II in her sister; Emphysema in her father; Parkinsonism in her mother; Tuberculosis in her mother and sister.    Review of Systems: Review of Systems  Constitutional: Negative.   Respiratory: Negative.   Cardiovascular: Negative.   Gastrointestinal: Negative.   Musculoskeletal: Negative.   Neurological: Negative.   Psychiatric/Behavioral: Negative.   All other systems reviewed and are negative.    PHYSICAL EXAM: VS:  There were no vitals taken  for this visit. , BMI There is no height or weight on file to calculate BMI.  GEN: Well nourished, well developed, in no acute distress, obese  HEENT: normal  Neck: no JVD, carotid bruits, or masses Cardiac: RRR; no murmurs, rubs, or gallops,no edema  Respiratory:  clear to auscultation bilaterally, normal work of breathing GI: soft, nontender, nondistended, + BS MS: no deformity or atrophy , boot in place on the right ankle/foot Skin: warm and dry, no rash Neuro:  Strength and sensation are intact Psych: euthymic mood, full affect    Recent Labs: 12/28/2017: ALT 10; BUN 16; Creatinine, Ser 0.76; Hemoglobin 12.3; Platelets 198; Potassium 4.3; Sodium 140; TSH 4.149    Lipid Panel Lab Results  Component Value Date   CHOL 152 12/14/2015   HDL 37.60 (L) 12/14/2015   LDLCALC 95 12/14/2015   TRIG 98.0 12/14/2015      Wt Readings from Last 3 Encounters:  02/08/18 289 lb (131.1 kg)  01/17/18 292 lb (132.5 kg)  12/12/17 295 lb (133.8 kg)       ASSESSMENT AND PLAN:  PAF  (paroxysmal atrial fibrillation) ([place]) - Plan: EKG 12-Lead Maintaining normal sinus rhythm. No symptoms concerning for atrial fibrillation Tolerating anticoagulation, stable  Essential hypertension - Plan: EKG 12-Lead Blood pressure is well controlled on today's visit. No changes made to the medications. Stable.  Recommend she monitor her blood pressure as weight decreases as medications may need to be adjusted downward  Hyperlipidemia Cholesterol is at goal on the current lipid regimen. No changes to the medications were made. Stable, likely improving with weight loss  Coronary artery disease, non-occlusive Currently with no symptoms of angina. No further workup at this time. Continue current medication regimen.  Stable, no further testing ordered  Morbid obesity, unspecified obesity type ([place]) Limited in her ability to exercise secondary to arthritis.  Dramatic weight loss with weight watchers.  Congratulated her  Bilateral leg edema Leg edema much improved with weight loss, periodic torsemide  Chronic diastolic CHF Taking torsemide as needed for leg swelling and abdominal bloating Likely will have less issues as weight is down 60 pounds Diet much better controlled   Total encounter time more than 25 minutes  Greater than 50% was spent in counseling and coordination of care with the patient   Disposition:   F/U  12 months   No orders of the defined types were placed in this encounter.    Signed, Esmond Plants, M.D., Ph.D. 02/11/2018  [place], [place]

## 2018-02-11 NOTE — Telephone Encounter (Signed)
Ok to refill in Dr Alla German absence.  Name of Medication: Oxycodone Name of Pharmacy: [facility] [address] Last [place] or Written Date and Quantity: 01/10/2018 #120 Last Office Visit and Type: 02/08/2018 Acute  Next Office Visit and Type: 03/20/18 3 Month F/U Last Controlled Substance Agreement Date: 03/09/17 Last UDS: 03/09/17

## 2018-02-12 ENCOUNTER — Ambulatory Visit: Payer: Federal, State, Local not specified - PPO | Admitting: Cardiovascular Disease

## 2018-02-21 ENCOUNTER — Other Ambulatory Visit: Payer: Self-pay | Admitting: Internal Medicine

## 2018-03-11 ENCOUNTER — Telehealth: Payer: Self-pay | Admitting: Internal Medicine

## 2018-03-11 NOTE — Telephone Encounter (Signed)
Please check with her These type of forms are usually for folks who require help with activities of daily living like bathing and dressing. She has had outpatient therapy recently--is she now homebound and can't go out? That is usually the criteria for doing home therapy. I am also unclear of why she needs home skilled nursing

## 2018-03-11 NOTE — Telephone Encounter (Signed)
Pt's husband dropped off forms for [facility] that needs to be filled out. Form placed in prescription tower for Dr. Silvio Pate in front office.

## 2018-03-11 NOTE — Telephone Encounter (Signed)
Forms placed in Dr Letvak's inbox on his desk. 

## 2018-03-12 ENCOUNTER — Other Ambulatory Visit: Payer: Self-pay

## 2018-03-12 DIAGNOSIS — Z0279 Encounter for issue of other medical certificate: Secondary | ICD-10-CM

## 2018-03-12 MED ORDER — OXYCODONE HCL 10 MG PO TABS: 10.0000 mg | ORAL_TABLET | Freq: Two times a day (BID) | ORAL | 0 refills | Status: DC | PRN

## 2018-03-12 NOTE — Telephone Encounter (Signed)
Spoke to pt. She said it is for retro when she got out of [facility] June 24 to September. It is a Scientist, physiological.

## 2018-03-12 NOTE — Telephone Encounter (Signed)
Form done $20

## 2018-03-12 NOTE — Telephone Encounter (Signed)
error 

## 2018-03-12 NOTE — Telephone Encounter (Signed)
Name of Medication: oxycodone 10 mg Name of Pharmacy: [facility] [address] Last Fill or Written Date and Quantity: # 120 on 02/11/18 Last Office Visit and Type: 02/08/18 acute; 11/26/17 for 3 mth pain mgt Next Office Visit and Type: 03/20/18 CPX Last Controlled Substance Agreement Date: 03/09/17 Last UDS:03/09/17

## 2018-03-12 NOTE — Telephone Encounter (Signed)
Patient notified form ready. Form mailed to patient. Patient aware of $20 charge.

## 2018-03-20 ENCOUNTER — Ambulatory Visit (INDEPENDENT_AMBULATORY_CARE_PROVIDER_SITE_OTHER): Payer: Medicare Other | Admitting: Internal Medicine

## 2018-03-20 ENCOUNTER — Encounter: Payer: Self-pay | Admitting: Internal Medicine

## 2018-03-20 VITALS — BP 112/72 | HR 67 | Temp 97.6°F | Ht 64.5 in | Wt 308.0 lb

## 2018-03-20 DIAGNOSIS — G894 Chronic pain syndrome: Secondary | ICD-10-CM

## 2018-03-20 DIAGNOSIS — Z7189 Other specified counseling: Secondary | ICD-10-CM

## 2018-03-20 DIAGNOSIS — I5032 Chronic diastolic (congestive) heart failure: Secondary | ICD-10-CM | POA: Diagnosis not present

## 2018-03-20 DIAGNOSIS — M069 Rheumatoid arthritis, unspecified: Secondary | ICD-10-CM

## 2018-03-20 DIAGNOSIS — J479 Bronchiectasis, uncomplicated: Secondary | ICD-10-CM

## 2018-03-20 DIAGNOSIS — Z0000 Encounter for general adult medical examination without abnormal findings: Secondary | ICD-10-CM | POA: Insufficient documentation

## 2018-03-20 DIAGNOSIS — F39 Unspecified mood [affective] disorder: Secondary | ICD-10-CM | POA: Insufficient documentation

## 2018-03-20 DIAGNOSIS — I10 Essential (primary) hypertension: Secondary | ICD-10-CM

## 2018-03-20 DIAGNOSIS — F1120 Opioid dependence, uncomplicated: Secondary | ICD-10-CM

## 2018-03-20 DIAGNOSIS — I480 Paroxysmal atrial fibrillation: Secondary | ICD-10-CM | POA: Diagnosis not present

## 2018-03-20 NOTE — Assessment & Plan Note (Signed)
Foot better but back still bad On lyrica/oxycodone

## 2018-03-20 NOTE — Progress Notes (Signed)
Hearing Screening   Method: Audiometry   125Hz  250Hz  500Hz  1000Hz  2000Hz  3000Hz  4000Hz  6000Hz  8000Hz   Right ear:   20 20 20  20     Left ear:   20 20 20  20       Visual Acuity Screening   Right eye Left eye Both eyes  Without correction: 20/25 20/30 20/20   With correction:

## 2018-03-20 NOTE — Assessment & Plan Note (Signed)
See social history 

## 2018-03-20 NOTE — Patient Instructions (Signed)
Please set up your screening mammogram. 

## 2018-03-20 NOTE — Assessment & Plan Note (Signed)
I have personally reviewed the Medicare Annual Wellness questionnaire and have noted 1. The patient's medical and social history 2. Their use of alcohol, tobacco or illicit drugs 3. Their current medications and supplements 4. The patient's functional ability including ADL's, fall risks, home safety risks and hearing or visual             impairment. 5. Diet and physical activities 6. Evidence for depression or mood disorders  The patients weight, height, BMI and visual acuity have been recorded in the chart I have made referrals, counseling and provided education to the patient based review of the above and I have provided the pt with a written personalized care plan for preventive services.  I have provided you with a copy of your personalized plan for preventive services. Please take the time to review along with your updated medication list.  Yearly flu vaccine Colon due 2025 Mammogram due in the next few months--she will set up Consider shingrix Not really able to exercise

## 2018-03-20 NOTE — Assessment & Plan Note (Signed)
Seems to have reactive depression from pain, holidays, ?winter Not MDD Hold off on meds unless worsens

## 2018-03-20 NOTE — Assessment & Plan Note (Signed)
No symptoms Regular now On pradaxa

## 2018-03-20 NOTE — Assessment & Plan Note (Signed)
Reviewed CSRS---no problems Due for UDS

## 2018-03-20 NOTE — Progress Notes (Signed)
Subjective:    Patient ID: Tina Pacheco, female    DOB: 04-16-1950, 68 y.o.   MRN: [id number]  HPI Here for Medicare wellness visit and follow up of chronic health conditions Reviewed form and advanced directives Reviewed other doctors No alcohol or tobacco Not able to exercise much--short periods on stationery bike Hasn't been driving--hard to get around with heavy rollator Does own ADLs---not really any housework Mild memory issues No falls Some hopeless feelings. Hard to get motivated to get up. Some degree of anhedonia. Mostly related to pain. Seems to be worse lately---might be the holidays and she hopes to improve Minor memory issues Vision is okay. Hearing is fine  Foot has improved some Ongoing bad back pain---hard to "walk right" Has to sleep in recliner--back much worse if she sleeps in bed Continues on the oxycodone lyrica from Dr Wadie Lessen on lefunomide and plaquenil for the RA  No palpitations No chest pain Chronic SOB--especially after lying down or with activity No worsening Still has edema in the surgical foot---no change in calves  Had lost some weight Put it back on---due to "sitting around" Plans to go back to [employer]  Current Outpatient Medications on File Prior to Visit  Medication Sig Dispense Refill  . amLODipine (NORVASC) 5 MG tablet Take 5 mgs by mouth once daily in the evening 90 tablet 3  . atorvastatin (LIPITOR) 20 MG tablet TAKE 1 TABLET DAILY AT 6PM 90 tablet 1  . diphenhydrAMINE (BENADRYL) 25 MG tablet Take 25 mg by mouth daily as needed for allergies.    . febuxostat (ULORIC) 40 MG tablet Take 40 mg by mouth daily.    . folic acid (FOLVITE) 1 MG tablet Take 1 mg by mouth daily.    Marland Kitchen gabapentin (NEURONTIN) 300 MG capsule TAKE 1 CAPSULE BY MOUTH THREE TIMES A DAY(TAKE 1 CAPSULE 2-3 HOURS BEFORE BEDTIME) 90 capsule 5  . hydroxychloroquine (PLAQUENIL) 200 MG tablet Take 200 mg by mouth 2 (two) times daily.     Marland Kitchen KLOR-CON M20 20  MEQ tablet TAKE 1/2 TABLET (=10MEQ)   TWO TIMES A DAY ; MAY TAKE AND EXTRA TABLET WHEN      TAKING TORSEMIDE 180 tablet 3  . leflunomide (ARAVA) 20 MG tablet Take 20 mg by mouth every morning.     . meclizine (ANTIVERT) 25 MG tablet Take 1 tablet (25 mg total) by mouth 3 (three) times daily. 90 tablet 11  . metoprolol succinate (TOPROL-XL) 50 MG 24 hr tablet TAKE 1 TABLET BY MOUTH ONCE DAILY WITH OR IMMEDIATELY FOLLOWING A MEAL 90 tablet 3  . montelukast (SINGULAIR) 10 MG tablet TAKE 1 TABLET BY MOUTH AT BEDTIME 90 tablet 3  . Multiple Vitamin (MULTIVITAMIN) tablet Take 1 tablet by mouth daily.     . Oxycodone HCl 10 MG TABS Take 1-2 tablets (10-20 mg total) by mouth 2 (two) times daily as needed. 120 tablet 0  . polyethylene glycol (MIRALAX / GLYCOLAX) packet Take 17 g by mouth daily as needed for mild constipation. 14 each 0  . PRADAXA 150 MG CAPS capsule TAKE 1 CAPSULE TWICE DAILY 180 capsule 3  . pregabalin (LYRICA) 150 MG capsule Take 150 mg by mouth 3 (three) times daily.    . promethazine (PHENERGAN) 25 MG tablet TK 1/2 TO 1 T PO TID PRN N  1  . SF 5000 PLUS 1.1 % CREA dental cream Place 1 application onto teeth 2 (two) times daily.  1  . sodium chloride (  OCEAN) 0.65 % SOLN nasal spray Place 1 spray into both nostrils as needed for congestion.    Marland Kitchen terazosin (HYTRIN) 10 MG capsule TAKE 1 CAPSULE BY MOUTH AT BEDTIME 90 capsule 3  . tolterodine (DETROL LA) 2 MG 24 hr capsule Take 1 capsule (2 mg total) by mouth daily as needed (bladder spasms). 30 capsule 5  . torsemide (DEMADEX) 20 MG tablet Take 20 mg by mouth twice daily as needed for swelling 60 tablet 3   No current facility-administered medications on file prior to visit.     Allergies  Allergen Reactions  . Codeine     Nausea and vomiting    Past Medical History:  Diagnosis Date  . Arthritis    RA  . Asthma   . Basal cell carcinoma   . Cataract 2018   bilateral eyes; corrected with surgery  . Claustrophobia   . Collagen  vascular disease (H[facility])    RA  . COPD (chronic obstructive pulmonary disease) ([place])   . Diastolic dysfunction    a. echo 07/2014: EF 55-60%, no RWMA, GR2DD, mild MR, LA moderately dilated, PASP 38 mm Hg  . Dyslipidemia   . Dysrhythmia   . Headache    migraines  . Hemihypertrophy   . History of cardiac cath    a. 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 History of gout   . Hyperplastic colonic polyp 2003  . Hypertension   . Hypokalemia   . Morbid obesity ([place])   . PAF (paroxysmal atrial fibrillation) (H[facility])    a. on Pradaxa; b. CHADSVASc at least 2 (HTN & female)  . Rheumatoid arthritis(714.0)   . Sleep apnea    a. not compliant with CPAP    Past Surgical History:  Procedure Laterality Date  . ABDOMINAL HYSTERECTOMY    . APPLICATION OF WOUND VAC Right 08/09/2017   Procedure: APPLICATION OF WOUND VAC;  Surgeon: Albertine Patricia, DPM;  Location: [facility];  Service: Podiatry;  Laterality: Right;  . CARDIAC CATHETERIZATION  05/24/2010   nonobstructive CAD  . CATARACT EXTRACTION W/ INTRAOCULAR LENS IMPLANT Right 06/12/2016   Dr. Darleen Crocker  . CATARACT EXTRACTION W/ INTRAOCULAR LENS IMPLANT Left 06/26/2016   Dr. Darleen Crocker  . CESAREAN SECTION    . CHOLECYSTECTOMY    . COLONOSCOPY  06/12/2011   Procedure: COLONOSCOPY;  Surgeon: Juanita Craver, MD;  [facility];  Service: Endoscopy;  Laterality: N/A;  . COLONOSCOPY N/A 03/17/2013   Procedure: COLONOSCOPY;  Surgeon: Juanita Craver, MD;  [facility];  Service: Endoscopy;  Laterality: N/A;  . EYE SURGERY    . FOOT ARTHRODESIS Right 07/13/2017   Procedure: ARTHRODESIS FOOT-MULTI.FUSIONS (6 JOINTS);  Surgeon: Albertine Patricia, DPM;  Location: [facility];  Service: Podiatry;  Laterality: Right;  . IRRIGATION AND DEBRIDEMENT FOOT Right 08/09/2017   Procedure: IRRIGATION AND DEBRIDEMENT FOOT;  Surgeon: Albertine Patricia, DPM;  Location: [facility];  Service: Podiatry;  Laterality: Right;  . KNEE ARTHROSCOPY     bilateral    . SINUSOTOMY    . TOOTH EXTRACTION  12/2016  . VAGINAL HYSTERECTOMY      Family History  Problem Relation Age of Onset  . Emphysema Father        smoked  . Tuberculosis Mother   . Parkinsonism Mother   . Diabetes type II Sister   . Breast cancer Sister   . Breast cancer Maternal Aunt   . Tuberculosis Sister     Social History   Socioeconomic History  .  Marital status: Married    Spouse name: Not on file  . Number of children: 1  . Years of education: Not on file  . Highest education level: Not on file  Occupational History  . 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 Financial resource strain: Not on file  . Food insecurity:    Worry: Not on file    Inability: Not on file  . Transportation needs:    Medical: Not on file    Non-medical: Not on file  Tobacco Use  . Smoking status: Never Smoker  . Smokeless tobacco: Never Used  Substance and Sexual Activity  . Alcohol use: No  . Drug use: No  . Sexual activity: Not on file  Lifestyle  . Physical activity:    Days per week: Not on file    Minutes per session: Not on file  . Stress: Not on file  Relationships  . Social connections:    Talks on phone: Not on file    Gets together: Not on file    Attends religious service: Not on file    Active member of club or organization: Not on file    Attends meetings of clubs or organizations: Not on file    Relationship status: Not on file  . Intimate partner violence:    Fear of current or ex partner: Not on file    Emotionally abused: Not on file    Physically abused: Not on file    Forced sexual activity: Not on file  Other Topics Concern  . Not on file  Social History Narrative   No living will   Husband, then daughter should be decision maker   Would accept resuscitation attempts   Not sure about tube feeds   Review of Systems Appetite is not great---even though she gained weight Some sleep issues--better in  recliner No suspicious skin lesions. Does have dermatologist--but no visit lately Wears seat belt Teeth not great--keeps up with dentist Occasional heartburn---OTC effective. No dysphagia Bowels fine--no blood Nausea is some better---not needing the meclizine regularly now    Objective:   Physical Exam  Constitutional: She is oriented to person, place, and time. No distress.  HENT:  Mouth/Throat: Oropharynx is clear and moist. No oropharyngeal exudate.  Neck: No thyromegaly present.  Cardiovascular: Normal rate, regular rhythm and normal heart sounds. Exam reveals no gallop.  No murmur heard. Feet warm 1+ pulse on left, can't palpate on right  Respiratory: Effort normal and breath sounds normal. No respiratory distress. She has no wheezes. She has no rales.  GI: Soft. There is no abdominal tenderness.  Musculoskeletal:     Comments: 2+ non pitting edema right foot and calf 1+ without pitting left calf  Lymphadenopathy:    She has no cervical adenopathy.  Neurological: She is alert and oriented to person, place, and time.  President--"Trump, Obama, Bush" [id number] D-l-r-[id number]-w Recall 3/3  Skin: No rash noted. No erythema.  Psychiatric:  No clear depression Normal interaction           Assessment & Plan:

## 2018-03-20 NOTE — Assessment & Plan Note (Signed)
Compensated on current regimen No change needed

## 2018-03-20 NOTE — Assessment & Plan Note (Signed)
Known cylindrical bronchiectasis No recent infections

## 2018-03-20 NOTE — Assessment & Plan Note (Signed)
BP Readings from Last 3 Encounters:  03/20/18 112/72  02/08/18 108/62  01/17/18 127/62   Good control

## 2018-03-20 NOTE — Assessment & Plan Note (Signed)
Controlled on her regimen Hopes to switch to [facility] for closer care

## 2018-03-20 NOTE — Assessment & Plan Note (Signed)
Plans to go back to [employer]

## 2018-03-24 LAB — PAIN MGMT, PROFILE 8 W/CONF, U
6 Acetylmorphine: NEGATIVE ng/mL (ref <10)
Alcohol Metabolites: NEGATIVE ng/mL (ref <500)
Amphetamines: NEGATIVE ng/mL (ref <500)
BUPRENORPHINE, URINE: NEGATIVE ng/mL (ref <5)
Benzodiazepines: NEGATIVE ng/mL (ref <100)
Buprenorphine: NEGATIVE ng/mL (ref <2)
Cocaine Metabolite: NEGATIVE ng/mL (ref <150)
Codeine: NEGATIVE ng/mL (ref <50)
Creatinine: 67 mg/dL
Hydrocodone: NEGATIVE ng/mL (ref <50)
Hydromorphone: NEGATIVE ng/mL (ref <50)
MDMA: NEGATIVE ng/mL (ref <500)
Marijuana Metabolite: NEGATIVE ng/mL (ref <20)
Morphine: NEGATIVE ng/mL (ref <50)
Norbuprenorphine: NEGATIVE ng/mL (ref <2)
Norhydrocodone: NEGATIVE ng/mL (ref <50)
Noroxycodone: 2330 ng/mL — ABNORMAL HIGH (ref <50)
Opiates: NEGATIVE ng/mL (ref <100)
Oxidant: NEGATIVE ug/mL (ref <200)
Oxycodone: 1037 ng/mL — ABNORMAL HIGH (ref <50)
Oxycodone: POSITIVE ng/mL — AB (ref <100)
Oxymorphone: 1892 ng/mL — ABNORMAL HIGH (ref <50)
pH: 6.27 (ref 4.5–9.0)

## 2018-03-25 DIAGNOSIS — T8131XA Disruption of external operation (surgical) wound, not elsewhere classified, initial encounter: Secondary | ICD-10-CM | POA: Diagnosis not present

## 2018-03-25 DIAGNOSIS — M792 Neuralgia and neuritis, unspecified: Secondary | ICD-10-CM | POA: Diagnosis not present

## 2018-03-25 DIAGNOSIS — M19171 Post-traumatic osteoarthritis, right ankle and foot: Secondary | ICD-10-CM | POA: Diagnosis not present

## 2018-03-25 DIAGNOSIS — Z9889 Other specified postprocedural states: Secondary | ICD-10-CM | POA: Diagnosis not present

## 2018-03-29 ENCOUNTER — Other Ambulatory Visit: Payer: Self-pay | Admitting: Internal Medicine

## 2018-04-01 DIAGNOSIS — G2581 Restless legs syndrome: Secondary | ICD-10-CM | POA: Diagnosis not present

## 2018-04-01 DIAGNOSIS — E6609 Other obesity due to excess calories: Secondary | ICD-10-CM | POA: Diagnosis not present

## 2018-04-01 DIAGNOSIS — M069 Rheumatoid arthritis, unspecified: Secondary | ICD-10-CM | POA: Diagnosis not present

## 2018-04-01 DIAGNOSIS — M1A00X0 Idiopathic chronic gout, unspecified site, without tophus (tophi): Secondary | ICD-10-CM | POA: Insufficient documentation

## 2018-04-01 DIAGNOSIS — Z79899 Other long term (current) drug therapy: Secondary | ICD-10-CM | POA: Diagnosis not present

## 2018-04-01 DIAGNOSIS — Z6836 Body mass index (BMI) 36.0-36.9, adult: Secondary | ICD-10-CM | POA: Diagnosis not present

## 2018-04-09 ENCOUNTER — Other Ambulatory Visit: Payer: Self-pay

## 2018-04-09 MED ORDER — OXYCODONE HCL 10 MG PO TABS: 10.0000 mg | ORAL_TABLET | Freq: Two times a day (BID) | ORAL | 0 refills | Status: DC | PRN

## 2018-04-09 NOTE — Telephone Encounter (Signed)
Name of Medication: Oxycodone Name of Pharmacy: [facility] [address]  Last Fill or Written Date and Quantity: 03/12/18, #120 Last Office Visit and Type: 03/20/18, CPE Next Office Visit and Type: 06/19/18, 3 mo f/u Last Controlled Substance Agreement Date: 03/09/17 Last UDS: 03/20/17  Also, pt is requesting an increase in amount of gabapentin she takes.  Pls advise pt at [phone].

## 2018-04-18 ENCOUNTER — Other Ambulatory Visit: Payer: Self-pay | Admitting: Internal Medicine

## 2018-05-06 ENCOUNTER — Other Ambulatory Visit: Payer: Self-pay | Admitting: *Deleted

## 2018-05-06 DIAGNOSIS — M19071 Primary osteoarthritis, right ankle and foot: Secondary | ICD-10-CM | POA: Diagnosis not present

## 2018-05-06 DIAGNOSIS — M84374A Stress fracture, right foot, initial encounter for fracture: Secondary | ICD-10-CM | POA: Diagnosis not present

## 2018-05-06 MED ORDER — OXYCODONE HCL 10 MG PO TABS: 10.0000 mg | ORAL_TABLET | Freq: Two times a day (BID) | ORAL | 0 refills | Status: DC | PRN

## 2018-05-06 NOTE — Telephone Encounter (Signed)
Patient called requesting a refill  Name of Medication: Oxycodone Name of Pharmacy: [place] or Written Date and Quantity: 04/09/2018 #120 Last Office Visit and Type: 03/20/18 AMW Next Office Visit and Type: 06/19/2018 Last Controlled Substance Agreement Date: 03/09/17 Last UDS: 03/20/17

## 2018-05-07 ENCOUNTER — Ambulatory Visit: Payer: Federal, State, Local not specified - PPO | Admitting: Cardiovascular Disease

## 2018-05-14 DIAGNOSIS — Z79899 Other long term (current) drug therapy: Secondary | ICD-10-CM | POA: Diagnosis not present

## 2018-05-14 DIAGNOSIS — Z9842 Cataract extraction status, left eye: Secondary | ICD-10-CM | POA: Diagnosis not present

## 2018-05-14 DIAGNOSIS — H52222 Regular astigmatism, left eye: Secondary | ICD-10-CM | POA: Diagnosis not present

## 2018-05-14 DIAGNOSIS — Z9841 Cataract extraction status, right eye: Secondary | ICD-10-CM | POA: Diagnosis not present

## 2018-05-15 ENCOUNTER — Encounter: Payer: Self-pay | Admitting: Internal Medicine

## 2018-05-15 ENCOUNTER — Other Ambulatory Visit: Payer: Self-pay

## 2018-05-15 ENCOUNTER — Telehealth: Payer: Self-pay

## 2018-05-15 ENCOUNTER — Ambulatory Visit (INDEPENDENT_AMBULATORY_CARE_PROVIDER_SITE_OTHER): Payer: Medicare Other | Admitting: Internal Medicine

## 2018-05-15 VITALS — BP 132/64 | HR 60 | Temp 97.4°F | Ht 64.5 in | Wt 314.0 lb

## 2018-05-15 DIAGNOSIS — R0789 Other chest pain: Secondary | ICD-10-CM | POA: Diagnosis not present

## 2018-05-15 NOTE — Telephone Encounter (Signed)
Call to patient to reschedule in regards to COVID-19 restrictions.    She denies any issues and is comfortable moving appt at this time and denies any new, urgent or worsening sx.     COVID-19 Pre-Screening:  1. Have you been in contact with someone who was sick?  no 2. Do you have any of the following symptoms (cough, fever, muscle pain, vomiting, diarrhea, weakness abdominal pain, rash, red eye, bruising or bleeding, joint pain, severe headache)? no 3. Have you travelled internationally or out of state in the last month? no 4. Do you need any refills at this time? no  Patient aware of the following: Please be advised that we require, no one but yourself to come to appointment. If necessary, only one visitor may come with you into the building. They will also be asked the same screening questions. You will be contacted at a later time to reschedule. However, this will depend on ongoing evaluation of the Covid-19 situation.  Please call us if any new questions or concerns arise. We are here for advice.  Routing to COVID cancel pool.

## 2018-05-15 NOTE — Progress Notes (Signed)
Subjective:    Patient ID: Tina Pacheco, female    DOB: 04/07/1950, 68 y.o.   MRN: [id number]  HPI Here due to abdominal pain  Hurt her left side about a year ago Trying to get something off the side of the couch---landed on a water bottle  Didn't get any evaluation --had the flu then Mostly had resolved--but not completely  About 6 weeks ago, was reaching with her left hand and got sharp pain in the same spot Did settle down a bit---but was taking it very easy  About 10 days ago, was trying to get clothes out of washer Reached on tiptoes and came down on the same spot hard Bad pain right away Not really improving Notes it if she coughs, breathes too big, moves the wrong way Not quite as bad as the very first time--but pretty bad  Breathing shallow--but no SOB  Current Outpatient Medications on File Prior to Visit  Medication Sig Dispense Refill  . amLODipine (NORVASC) 5 MG tablet Take 5 mgs by mouth once daily in the evening 90 tablet 3  . atorvastatin (LIPITOR) 20 MG tablet TAKE 1 TABLET DAILY AT 6PM 90 tablet 0  . diphenhydrAMINE (BENADRYL) 25 MG tablet Take 25 mg by mouth daily as needed for allergies.    . febuxostat (ULORIC) 40 MG tablet Take 40 mg by mouth daily.    . folic acid (FOLVITE) 1 MG tablet Take 1 mg by mouth daily.    Marland Kitchen gabapentin (NEURONTIN) 300 MG capsule TAKE 1 CAPSULE BY MOUTH THREE TIMES A DAY(TAKE 1 CAPSULE 2-3 HOURS BEFORE BEDTIME) 90 capsule 5  . hydroxychloroquine (PLAQUENIL) 200 MG tablet Take 200 mg by mouth 2 (two) times daily.     Marland Kitchen KLOR-CON M20 20 MEQ tablet TAKE 1/2 TABLET (=10MEQ)   TWO TIMES A DAY ; MAY TAKE AND EXTRA TABLET WHEN      TAKING TORSEMIDE 180 tablet 3  . leflunomide (ARAVA) 20 MG tablet Take 20 mg by mouth every morning.     . meclizine (ANTIVERT) 25 MG tablet Take 1 tablet (25 mg total) by mouth 3 (three) times daily. 90 tablet 11  . metoprolol succinate (TOPROL-XL) 50 MG 24 hr tablet TAKE 1 TABLET BY MOUTH ONCE DAILY WITH OR  IMMEDIATELY FOLLOWING A MEAL 90 tablet 3  . montelukast (SINGULAIR) 10 MG tablet TAKE 1 TABLET BY MOUTH AT BEDTIME 90 tablet 3  . Multiple Vitamin (MULTIVITAMIN) tablet Take 1 tablet by mouth daily.     . Oxycodone HCl 10 MG TABS Take 1-2 tablets (10-20 mg total) by mouth 2 (two) times daily as needed. 120 tablet 0  . polyethylene glycol (MIRALAX / GLYCOLAX) packet Take 17 g by mouth daily as needed for mild constipation. 14 each 0  . PRADAXA 150 MG CAPS capsule TAKE 1 CAPSULE TWICE DAILY 180 capsule 3  . promethazine (PHENERGAN) 25 MG tablet TK 1/2 TO 1 T PO TID PRN N  1  . SF 5000 PLUS 1.1 % CREA dental cream Place 1 application onto teeth 2 (two) times daily.  1  . sodium chloride (OCEAN) 0.65 % SOLN nasal spray Place 1 spray into both nostrils as needed for congestion.    Marland Kitchen terazosin (HYTRIN) 10 MG capsule TAKE 1 CAPSULE BY MOUTH AT BEDTIME 90 capsule 3  . tolterodine (DETROL LA) 2 MG 24 hr capsule Take 1 capsule (2 mg total) by mouth daily as needed (bladder spasms). 30 capsule 5  . torsemide (DEMADEX) 20  MG tablet Take 20 mg by mouth twice daily as needed for swelling 60 tablet 3   No current facility-administered medications on file prior to visit.     Allergies  Allergen Reactions  . Codeine     Nausea and vomiting    Past Medical History:  Diagnosis Date  . Arthritis    RA  . Asthma   . Basal cell carcinoma   . Cataract 2018   bilateral eyes; corrected with surgery  . Claustrophobia   . Collagen vascular disease (HCC)    RA  . COPD (chronic obstructive pulmonary disease) ([place])   . Diastolic dysfunction    a. echo 07/2014: EF 55-60%, no RWMA, GR2DD, mild MR, LA moderately dilated, PASP 38 mm Hg  . Dyslipidemia   . Dysrhythmia   . Headache    migraines  . Hemihypertrophy   . History of cardiac cath    a. 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 History of gout   . Hyperplastic colonic polyp 2003  . Hypertension   . Hypokalemia   . Morbid obesity ([place])   .  PAF (paroxysmal atrial fibrillation) (HCC)    a. on Pradaxa; b. CHADSVASc at least 2 (HTN & female)  . Rheumatoid arthritis(714.0)   . Sleep apnea    a. not compliant with CPAP    Past Surgical History:  Procedure Laterality Date  . ABDOMINAL HYSTERECTOMY    . APPLICATION OF WOUND VAC Right 08/09/2017   Procedure: APPLICATION OF WOUND VAC;  Surgeon: Albertine Patricia, DPM;  Location: [facility];  Service: Podiatry;  Laterality: Right;  . CARDIAC CATHETERIZATION  05/24/2010   nonobstructive CAD  . CATARACT EXTRACTION W/ INTRAOCULAR LENS IMPLANT Right 06/12/2016   Dr. Darleen Crocker  . CATARACT EXTRACTION W/ INTRAOCULAR LENS IMPLANT Left 06/26/2016   Dr. Darleen Crocker  . CESAREAN SECTION    . CHOLECYSTECTOMY    . COLONOSCOPY  06/12/2011   Procedure: COLONOSCOPY;  Surgeon: Juanita Craver, MD;  Location: [facility];  Service: Endoscopy;  Laterality: N/A;  . COLONOSCOPY N/A 03/17/2013   Procedure: COLONOSCOPY;  Surgeon: Juanita Craver, MD;  Location: [facility];  Service: Endoscopy;  Laterality: N/A;  . EYE SURGERY    . FOOT ARTHRODESIS Right 07/13/2017   Procedure: ARTHRODESIS FOOT-MULTI.FUSIONS (6 JOINTS);  Surgeon: Albertine Patricia, DPM;  Location: [facility];  Service: Podiatry;  Laterality: Right;  . IRRIGATION AND DEBRIDEMENT FOOT Right 08/09/2017   Procedure: IRRIGATION AND DEBRIDEMENT FOOT;  Surgeon: Albertine Patricia, DPM;  Location: [facility];  Service: Podiatry;  Laterality: Right;  . KNEE ARTHROSCOPY     bilateral  . SINUSOTOMY    . TOOTH EXTRACTION  12/2016  . VAGINAL HYSTERECTOMY      Family History  Problem Relation Age of Onset  . Emphysema Father        smoked  . Tuberculosis Mother   . Parkinsonism Mother   . Diabetes type II Sister   . Breast cancer Sister   . Breast cancer Maternal Aunt   . Tuberculosis Sister     Social History   Socioeconomic History  . Marital status: Married    Spouse name: Not on file  . Number of children: 1  . Years of education: Not on  file  . Highest education level: Not on file  Occupational History  . 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 Financial resource strain: Not on file  . Food  insecurity:    Worry: Not on file    Inability: Not on file  . Transportation needs:    Medical: Not on file    Non-medical: Not on file  Tobacco Use  . Smoking status: Never Smoker  . Smokeless tobacco: Never Used  Substance and Sexual Activity  . Alcohol use: No  . Drug use: No  . Sexual activity: Not on file  Lifestyle  . Physical activity:    Days per week: Not on file    Minutes per session: Not on file  . Stress: Not on file  Relationships  . Social connections:    Talks on phone: Not on file    Gets together: Not on file    Attends religious service: Not on file    Active member of club or organization: Not on file    Attends meetings of clubs or organizations: Not on file    Relationship status: Not on file  . Intimate partner violence:    Fear of current or ex partner: Not on file    Emotionally abused: Not on file    Physically abused: Not on file    Forced sexual activity: Not on file  Other Topics Concern  . Not on file  Social History Narrative   No living will   Husband, then daughter should be decision maker   Would accept resuscitation attempts   Not sure about tube feeds   Review of Systems New fracture in right foot No cough No fever Eating fine. Bowels are good     Objective:   Physical Exam  Constitutional: She appears well-developed. No distress.  Neck: No thyromegaly present.  Cardiovascular: Normal rate, regular rhythm and normal heart sounds. Exam reveals no gallop and no friction rub.  No murmur heard. Respiratory: Effort normal and breath sounds normal. No respiratory distress. She has no wheezes. She has no rales.  Significant point tenderness over ~T10 on left in anterior axillary line. No clear bony abnormality palpated  GI:  Soft. There is no abdominal tenderness.  Lymphadenopathy:    She has no cervical adenopathy.           Assessment & Plan:

## 2018-05-15 NOTE — Telephone Encounter (Signed)
Call to patient to reschedule in regards to COVID-19 restrictions.  ?  No answer. No VM available.

## 2018-05-15 NOTE — Progress Notes (Incomplete)
Cardiology Office Note  Date:  05/15/2018   ID:  Tina, Pacheco Sep 06, 1950, MRN [id number]  PCP:  Venia Carbon, MD   No chief complaint on file.   HPI:  Tina Pacheco is a 68 y.o. woman with  morbid obesity,  May 23 2010  paroxysmal atrial fibrillation,  echocardiogram showing normal LV function with diastolic dysfunction,  hyperlipidemia, hypertension,  cardiac catheterization May 24 2010 mild nonobstructive coronary artery disease,  asthma/COPD,  suspected sleep apnea,  atrial fibrillation March and July 2012, October 2013.(no symptoms) started on diltiazem infusion and converted to normal sinus rhythm  rheumatoid arthritis  who presents for routine followup of her atrial fibrillation.  INTERVAL HISTORY: The patient reports today for follow up.  ***  Blood pressure ***/*** CR 0.74 Glucose 93   EKG personally reviewed by myself on todays visit Shows *** rhythm. *** bpm. ***   {Boot on the right foot, "broken bone, arthritis in foot"  Weight trending down, 60 pounds and she is following weight watchers Taking torsemide periodically for leg swelling She does some exercise with her recumbent bike  Denies any tachycardia concerning for atrial fibrillation but she was symptomatic in the past Tolerating anticoagulation  In 2016, no PAD in legs  EKG on today's visit shows normal sinus rhythm with rate 54 bpm, no significant ST or T-wave changes}  OTHER PAST MEDICAL HISTORY REVIEWED BY ME FOR TODAY'S VISIT: Previous Echo in 2016 with EF 36%, diastolic dysfunction, results reviewed with her.  Previously, Lisinopril was changed to Norvasc for cough.    fibromyalgia and rheumatoid arthritis  Family previously indicated that they thought that she has obstructive sleep apnea. She does not think that she can sleep with a CPAP. All of her atrial fibrillation episodes have occurred first thing in the morning prior to medications.   lab work showing  total cholesterol 114, LDL 55  PMH:   has a past medical history of Arthritis, Asthma, Basal cell carcinoma, Cataract (2018), Claustrophobia, Collagen vascular disease ([place]), COPD (chronic obstructive pulmonary disease) ([place]), Diastolic dysfunction, Dyslipidemia, Dysrhythmia, Headache, Hemihypertrophy, History of cardiac cath, History of gout, Hyperplastic colonic polyp (2003), Hypertension, Hypokalemia, Morbid obesity ([place]), PAF (paroxysmal atrial fibrillation) ([place]), Rheumatoid arthritis(714.0), and Sleep apnea.  PSH:    Past Surgical History:  Procedure Laterality Date   ABDOMINAL HYSTERECTOMY     APPLICATION OF WOUND VAC Right 08/09/2017   Procedure: APPLICATION OF WOUND VAC;  Surgeon: Albertine Patricia, DPM;  Location: [facility];  Service: Podiatry;  Laterality: Right;   CARDIAC CATHETERIZATION  05/24/2010   nonobstructive CAD   CATARACT EXTRACTION W/ INTRAOCULAR LENS IMPLANT Right 06/12/2016   Dr. Darleen Crocker   CATARACT EXTRACTION W/ INTRAOCULAR LENS IMPLANT Left 06/26/2016   Dr. Darleen Crocker   CESAREAN SECTION     CHOLECYSTECTOMY     COLONOSCOPY  06/12/2011   Procedure: COLONOSCOPY;  Surgeon: Juanita Craver, MD;  Location: [facility];  Service: Endoscopy;  Laterality: N/A;   COLONOSCOPY N/A 03/17/2013   Procedure: COLONOSCOPY;  Surgeon: Juanita Craver, MD;  Location: [facility];  Service: Endoscopy;  Laterality: N/A;   EYE SURGERY     FOOT ARTHRODESIS Right 07/13/2017   Procedure: ARTHRODESIS FOOT-MULTI.FUSIONS (6 JOINTS);  Surgeon: Albertine Patricia, DPM;  Location: [facility];  Service: Podiatry;  Laterality: Right;   IRRIGATION AND DEBRIDEMENT FOOT Right 08/09/2017   Procedure: IRRIGATION AND DEBRIDEMENT FOOT;  Surgeon: Albertine Patricia, DPM;  Location: [facility];  Service: Podiatry;  Laterality: Right;   KNEE ARTHROSCOPY  bilateral   SINUSOTOMY     TOOTH EXTRACTION  12/2016   VAGINAL HYSTERECTOMY      Current Outpatient Medications  Medication Sig Dispense Refill    amLODipine (NORVASC) 5 MG tablet Take 5 mgs by mouth once daily in the evening 90 tablet 3   atorvastatin (LIPITOR) 20 MG tablet TAKE 1 TABLET DAILY AT 6PM 90 tablet 0   diphenhydrAMINE (BENADRYL) 25 MG tablet Take 25 mg by mouth daily as needed for allergies.     febuxostat (ULORIC) 40 MG tablet Take 40 mg by mouth daily.     folic acid (FOLVITE) 1 MG tablet Take 1 mg by mouth daily.     gabapentin (NEURONTIN) 300 MG capsule TAKE 1 CAPSULE BY MOUTH THREE TIMES A DAY(TAKE 1 CAPSULE 2-3 HOURS BEFORE BEDTIME) 90 capsule 5   hydroxychloroquine (PLAQUENIL) 200 MG tablet Take 200 mg by mouth 2 (two) times daily.      KLOR-CON M20 20 MEQ tablet TAKE 1/2 TABLET (=10MEQ)   TWO TIMES A DAY ; MAY TAKE AND EXTRA TABLET WHEN      TAKING TORSEMIDE 180 tablet 3   leflunomide (ARAVA) 20 MG tablet Take 20 mg by mouth every morning.      meclizine (ANTIVERT) 25 MG tablet Take 1 tablet (25 mg total) by mouth 3 (three) times daily. 90 tablet 11   metoprolol succinate (TOPROL-XL) 50 MG 24 hr tablet TAKE 1 TABLET BY MOUTH ONCE DAILY WITH OR IMMEDIATELY FOLLOWING A MEAL 90 tablet 3   montelukast (SINGULAIR) 10 MG tablet TAKE 1 TABLET BY MOUTH AT BEDTIME 90 tablet 3   Multiple Vitamin (MULTIVITAMIN) tablet Take 1 tablet by mouth daily.      Oxycodone HCl 10 MG TABS Take 1-2 tablets (10-20 mg total) by mouth 2 (two) times daily as needed. 120 tablet 0   polyethylene glycol (MIRALAX / GLYCOLAX) packet Take 17 g by mouth daily as needed for mild constipation. 14 each 0   PRADAXA 150 MG CAPS capsule TAKE 1 CAPSULE TWICE DAILY 180 capsule 3   promethazine (PHENERGAN) 25 MG tablet TK 1/2 TO 1 T PO TID PRN N  1   SF 5000 PLUS 1.1 % CREA dental cream Place 1 application onto teeth 2 (two) times daily.  1   sodium chloride (OCEAN) 0.65 % SOLN nasal spray Place 1 spray into both nostrils as needed for congestion.     terazosin (HYTRIN) 10 MG capsule TAKE 1 CAPSULE BY MOUTH AT BEDTIME 90 capsule 3    tolterodine (DETROL LA) 2 MG 24 hr capsule Take 1 capsule (2 mg total) by mouth daily as needed (bladder spasms). 30 capsule 5   torsemide (DEMADEX) 20 MG tablet Take 20 mg by mouth twice daily as needed for swelling 60 tablet 3   No current facility-administered medications for this visit.      Allergies:   Codeine   Social History:  The patient  reports that she has never smoked. She has never used smokeless tobacco. She reports that she does not drink alcohol or use drugs.   Family History:   family history includes Breast cancer in her maternal aunt and sister; Diabetes type II in her sister; Emphysema in her father; Parkinsonism in her mother; Tuberculosis in her mother and sister.    Review of Systems: Review of Systems  Constitutional: Negative.   Eyes: Negative.   Respiratory: Negative.   Cardiovascular: Negative.   Gastrointestinal: Negative.   Genitourinary: Negative.   Musculoskeletal: Negative.  Neurological: Negative.   Psychiatric/Behavioral: Negative.   All other systems reviewed and are negative.    PHYSICAL EXAM: VS:  There were no vitals taken for this visit. , BMI There is no height or weight on file to calculate BMI.  Constitutional:  oriented to person, place, and time. No distress.  HENT: normal Head: Grossly normal Eyes:  no discharge. No scleral icterus.  Neck: No JVD, no carotid bruits  Cardiovascular: Regular rate and rhythm, no murmurs appreciated *** Pulmonary/Chest: Clear to auscultation bilaterally, no wheezes or rales Abdominal: Soft.  no distension.  no tenderness.  Musculoskeletal: Normal range of motion Neurological:  normal muscle tone. Coordination normal. No atrophy Skin: Skin warm and dry Psychiatric: normal affect, pleasant    Recent Labs: 12/28/2017: ALT 10; BUN 16; Creatinine, Ser 0.76; Hemoglobin 12.3; Platelets 198; Potassium 4.3; Sodium 140; TSH 4.149    Lipid Panel Lab Results  Component Value Date   CHOL 152  12/14/2015   HDL 37.60 (L) 12/14/2015   LDLCALC 95 12/14/2015   TRIG 98.0 12/14/2015      Wt Readings from Last 3 Encounters:  05/15/18 (!) 314 lb (142.4 kg)  03/20/18 (!) 308 lb (139.7 kg)  02/08/18 289 lb (131.1 kg)       ASSESSMENT AND PLAN:  PAF (paroxysmal atrial fibrillation) (HCC)  Plan: EKG 12-Lead Maintaining normal sinus rhythm. No symptoms concerning for atrial fibrillation Tolerating anticoagulation, stable  Essential hypertension  Plan: EKG 12-Lead Blood pressure is well controlled on today's visit. No changes made to the medications. Stable.  Recommend she monitor her blood pressure as weight decreases as medications may need to be adjusted downward  Hyperlipidemia Cholesterol is at goal on the current lipid regimen. No changes to the medications were made. Stable, likely improving with weight loss  Coronary artery disease, non-occlusive Currently with no symptoms of angina. No further workup at this time. Continue current medication regimen.  Stable, no further testing ordered  Morbid obesity, unspecified obesity type ([place]) Limited in her ability to exercise secondary to arthritis.  Dramatic weight loss with weight watchers.  Congratulated her  Bilateral leg edema Leg edema much improved with weight loss, periodic torsemide  Chronic diastolic CHF Taking torsemide as needed for leg swelling and abdominal bloating Likely will have less issues as weight is down 60 pounds Diet much better controlled   Total encounter time more than *** minutes  Greater than 50% was spent in counseling and coordination of care with the patient   Disposition:   F/U  *** months   No orders of the defined types were placed in this encounter.  I, Jesus Reyes am acting as a Education administrator for Ida Rogue, M.D., Ph.D.  {Add scribe attestation statement}   Signed, Esmond Plants, M.D., Ph.D. 05/15/2018  [place], [place]

## 2018-05-15 NOTE — Assessment & Plan Note (Signed)
Has re-injured the same area 3 times. No rib fracture seen on CT in November--so probably just bruising No worrisome features and nothing to suggest pulmonary, cardiac or GI damage Reassured Try heat

## 2018-05-16 ENCOUNTER — Ambulatory Visit: Payer: Federal, State, Local not specified - PPO | Admitting: Cardiovascular Disease

## 2018-05-20 DIAGNOSIS — T849XXD Unspecified complication of internal orthopedic prosthetic device, implant and graft, subsequent encounter: Secondary | ICD-10-CM | POA: Diagnosis not present

## 2018-05-20 DIAGNOSIS — M84374A Stress fracture, right foot, initial encounter for fracture: Secondary | ICD-10-CM | POA: Diagnosis not present

## 2018-05-20 DIAGNOSIS — M19071 Primary osteoarthritis, right ankle and foot: Secondary | ICD-10-CM | POA: Diagnosis not present

## 2018-05-20 DIAGNOSIS — S92014D Nondisplaced fracture of body of right calcaneus, subsequent encounter for fracture with routine healing: Secondary | ICD-10-CM | POA: Diagnosis not present

## 2018-05-20 DIAGNOSIS — S92901K Unspecified fracture of right foot, subsequent encounter for fracture with nonunion: Secondary | ICD-10-CM | POA: Diagnosis not present

## 2018-05-23 ENCOUNTER — Other Ambulatory Visit: Payer: Self-pay | Admitting: Cardiovascular Disease

## 2018-05-24 ENCOUNTER — Telehealth: Payer: Self-pay | Admitting: Cardiovascular Disease

## 2018-05-24 NOTE — Telephone Encounter (Signed)
Scheduled 3-30 at 840

## 2018-05-27 ENCOUNTER — Telehealth (INDEPENDENT_AMBULATORY_CARE_PROVIDER_SITE_OTHER): Payer: Medicare Other | Admitting: Cardiovascular Disease

## 2018-05-27 ENCOUNTER — Other Ambulatory Visit: Payer: Self-pay

## 2018-05-27 DIAGNOSIS — M069 Rheumatoid arthritis, unspecified: Secondary | ICD-10-CM

## 2018-05-27 DIAGNOSIS — E782 Mixed hyperlipidemia: Secondary | ICD-10-CM | POA: Diagnosis not present

## 2018-05-27 DIAGNOSIS — I10 Essential (primary) hypertension: Secondary | ICD-10-CM

## 2018-05-27 DIAGNOSIS — I480 Paroxysmal atrial fibrillation: Secondary | ICD-10-CM | POA: Diagnosis not present

## 2018-05-27 MED ORDER — DABIGATRAN ETEXILATE MESYLATE 150 MG PO CAPS: 150.0000 mg | ORAL_CAPSULE | Freq: Two times a day (BID) | ORAL | 3 refills | Status: DC

## 2018-05-27 MED ORDER — POTASSIUM CHLORIDE CRYS ER 20 MEQ PO TBCR: EXTENDED_RELEASE_TABLET | ORAL | 3 refills | Status: DC

## 2018-05-27 NOTE — Progress Notes (Addendum)
Virtual Visit via Video Note   This visit type was conducted due to national recommendations for restrictions regarding the COVID-19 Pandemic (e.g. social distancing) in an effort to limit this patient's exposure and mitigate transmission in our community.  Due to her co-morbid illnesses, this patient is at least at moderate risk for complications without adequate follow up.  This format is felt to be most appropriate for this patient at this time.  All issues noted in this document were discussed and addressed.  A limited physical exam was performed with this format.  Please refer to the patient's chart for her consent to telehealth for [facility].    Date:  05/27/2018   ID:  Tina Pacheco, Tina Pacheco 1950/05/20, MRN [id number]  Patient Location:  [place] [address]   Provider location:   [facility], [place] office  PCP:  Venia Carbon, MD  Cardiologist:  No primary care provider on file.   Chief Complaint:  Weakness, joint pain, fractures  WEBVISIT  History of Present Illness:    Tina Pacheco is a 68 y.o. female who presents via audio/video conferencing for a telehealth visit today.   The patient does not symptoms concerning for COVID-19 infection (fever, chills, cough, or new SHORTNESS OF BREATH).  Please refer to prior office visit for complete details: Patient has a past medical history of  Patient has a past medical history of: morbid obesity , fibromyalgia  March 26,2012paroxysmal atrial fibrillation,  echocardiogram showing normal LV function with diastolic dysfunction,  hyperlipidemia, hypertension,  cardiac catheterization May 24 2010 mild nonobstructive coronary artery disease,  asthma/COPD,  suspected sleep apnea,  atrial fibrillation March and July 2012, October 2013.(no symptoms) started on diltiazem infusion and converted to normal sinus rhythm  rheumatoid arthritis  who presents for routine followup of her atrial  fibrillation.  On her last clinic visit,  [place] down, 60 pounds and she is following weight watchers Taking torsemide periodically for leg swelling some exercise with her recumbent bike  Denies any tachycardia concerning for atrial fibrillation but she was symptomatic in the past Tolerating anticoagulation  Previous Echo in 2016 with EF 50%, diastolic dysfunction, results reviewed with her  Previous Echo in 2016 with EF 93%, diastolic dysfunction, results reviewed with her  Had surgery last may still recovering Dr. troxler  Needed to have repeat surgery,  Fracture in heal, in a boot Very sedentary at baseline  Lives with husband, Daughter comes in to help, with groceries  Denies having significant arrhythmia, denies atrial fibrillation BP running low No orthostasis  Had vertigo, better on meclizine  Take torsemide sparingly, can't get to the  Bathroom fast enough Does not have much swelling in her legs  Previous EKG reviewed from 07/06/2017 showed Sinus Bradycardia -Nonspecific ST depression. 51 bpm.  Date Total Chol. LDL Glucose Creatine TSH  07/2017   94 0.74   12/2017   93 0.76 4.149           Prior CV studies:   The following studies were reviewed today:  Echo 2016 Left ventricle: The cavity size was normal. There was moderate   concentric hypertrophy. Systolic function was normal. The   estimated ejection fraction was in the range of 55% to 60%. Wall   motion was normal; there were no regional wall motion   abnormalities. Features are consistent with a pseudonormal left   ventricular filling pattern, with concomitant abnormal relaxation   and increased filling pressure (grade 2  diastolic dysfunction). - Mitral valve: Calcified annulus. There was mild regurgitation. - Left atrium: The atrium was moderately dilated. - Pulmonary arteries: Systolic pressure was mildly increased. PA   peak pressure: 38 mm Hg (S).  Past Medical  History:  Diagnosis Date  . Arthritis    RA  . Asthma   . Basal cell carcinoma   . Cataract 2018   bilateral eyes; corrected with surgery  . Claustrophobia   . Collagen vascular disease (HCC)    RA  . COPD (chronic obstructive pulmonary disease) ([place])   . Diastolic dysfunction    a. echo 07/2014: EF 55-60%, no RWMA, GR2DD, mild MR, LA moderately dilated, PASP 38 mm Hg  . Dyslipidemia   . Dysrhythmia   . Headache    migraines  . Hemihypertrophy   . History of cardiac cath    a. 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 History of gout   . Hyperplastic colonic polyp 2003  . Hypertension   . Hypokalemia   . Morbid obesity ([place])   . PAF (paroxysmal atrial fibrillation) (HCC)    a. on Pradaxa; b. CHADSVASc at least 2 (HTN & female)  . Rheumatoid arthritis(714.0)   . Sleep apnea    a. not compliant with CPAP   Past Surgical History:  Procedure Laterality Date  . ABDOMINAL HYSTERECTOMY    . APPLICATION OF WOUND VAC Right 08/09/2017   Procedure: APPLICATION OF WOUND VAC;  Surgeon: Albertine Patricia, DPM;  Location: [facility] ORS;  Service: Podiatry;  Laterality: Right;  . CARDIAC CATHETERIZATION  05/24/2010   nonobstructive CAD  . CATARACT EXTRACTION W/ INTRAOCULAR LENS IMPLANT Right 06/12/2016   Dr. Darleen Crocker  . CATARACT EXTRACTION W/ INTRAOCULAR LENS IMPLANT Left 06/26/2016   Dr. Darleen Crocker  . CESAREAN SECTION    . CHOLECYSTECTOMY    . COLONOSCOPY  06/12/2011   Procedure: COLONOSCOPY;  Surgeon: Juanita Craver, MD;  Location: [facility];  Service: Endoscopy;  Laterality: N/A;  . COLONOSCOPY N/A 03/17/2013   Procedure: COLONOSCOPY;  Surgeon: Juanita Craver, MD;  Location: [facility];  Service: Endoscopy;  Laterality: N/A;  . EYE SURGERY    . FOOT ARTHRODESIS Right 07/13/2017   Procedure: ARTHRODESIS FOOT-MULTI.FUSIONS (6 JOINTS);  Surgeon: Albertine Patricia, DPM;  Location: [facility] ORS;  Service: Podiatry;  Laterality: Right;  . IRRIGATION AND DEBRIDEMENT FOOT Right 08/09/2017    Procedure: IRRIGATION AND DEBRIDEMENT FOOT;  Surgeon: Albertine Patricia, DPM;  Location: [facility];  Service: Podiatry;  Laterality: Right;  . KNEE ARTHROSCOPY     bilateral  . SINUSOTOMY    . TOOTH EXTRACTION  12/2016  . VAGINAL HYSTERECTOMY       Current Outpatient Medications on File Prior to Visit  Medication Sig Dispense Refill  . amLODipine (NORVASC) 5 MG tablet Take 5 mgs by mouth once daily in the evening 90 tablet 3  . atorvastatin (LIPITOR) 20 MG tablet TAKE 1 TABLET DAILY AT 6PM 90 tablet 0  . diphenhydrAMINE (BENADRYL) 25 MG tablet Take 25 mg by mouth daily as needed for allergies.    . febuxostat (ULORIC) 40 MG tablet Take 40 mg by mouth daily.    . folic acid (FOLVITE) 1 MG tablet Take 1 mg by mouth daily.    Marland Kitchen gabapentin (NEURONTIN) 300 MG capsule TAKE 1 CAPSULE BY MOUTH THREE TIMES A DAY(TAKE 1 CAPSULE 2-3 HOURS BEFORE BEDTIME) 90 capsule 5  . hydroxychloroquine (PLAQUENIL) 200 MG tablet Take 200 mg by mouth 2 (two) times daily.     Marland Kitchen  KLOR-CON M20 20 MEQ tablet TAKE 1/2 TABLET (=10MEQ)   TWO TIMES A DAY ; MAY TAKE AND EXTRA TABLET WHEN      TAKING TORSEMIDE 180 tablet 3  . leflunomide (ARAVA) 20 MG tablet Take 20 mg by mouth every morning.     . meclizine (ANTIVERT) 25 MG tablet Take 1 tablet (25 mg total) by mouth 3 (three) times daily. 90 tablet 11  . metoprolol succinate (TOPROL-XL) 50 MG 24 hr tablet TAKE 1 TABLET BY MOUTH ONCE DAILY WITH OR IMMEDIATELY FOLLOWING A MEAL 90 tablet 3  . montelukast (SINGULAIR) 10 MG tablet TAKE 1 TABLET BY MOUTH AT BEDTIME 90 tablet 3  . Multiple Vitamin (MULTIVITAMIN) tablet Take 1 tablet by mouth daily.     . Oxycodone HCl 10 MG TABS Take 1-2 tablets (10-20 mg total) by mouth 2 (two) times daily as needed. 120 tablet 0  . polyethylene glycol (MIRALAX / GLYCOLAX) packet Take 17 g by mouth daily as needed for mild constipation. 14 each 0  . PRADAXA 150 MG CAPS capsule TAKE 1 CAPSULE TWICE DAILY 180 capsule 3  . promethazine (PHENERGAN) 25 MG  tablet TK 1/2 TO 1 T PO TID PRN N  1  . SF 5000 PLUS 1.1 % CREA dental cream Place 1 application onto teeth 2 (two) times daily.  1  . sodium chloride (OCEAN) 0.65 % SOLN nasal spray Place 1 spray into both nostrils as needed for congestion.    Marland Kitchen terazosin (HYTRIN) 10 MG capsule TAKE 1 CAPSULE BY MOUTH AT BEDTIME 90 capsule 3  . tolterodine (DETROL LA) 2 MG 24 hr capsule Take 1 capsule (2 mg total) by mouth daily as needed (bladder spasms). 30 capsule 5  . torsemide (DEMADEX) 20 MG tablet Take 20 mg by mouth twice daily as needed for swelling 60 tablet 3   No current facility-administered medications on file prior to visit.     Allergies:   Codeine   Social History   Tobacco Use  . Smoking status: Never Smoker  . Smokeless tobacco: Never Used  Substance Use Topics  . Alcohol use: No  . Drug use: No     Family Hx: The patient's family history includes Breast cancer in her maternal aunt and sister; Diabetes type II in her sister; Emphysema in her father; Parkinsonism in her mother; Tuberculosis in her mother and sister.  ROS:   Please see the history of present illness.    Review of Systems  Constitutional: Negative.        Weak  Respiratory: Negative.   Cardiovascular: Negative.   Gastrointestinal: Negative.   Musculoskeletal: Positive for joint pain.  Neurological: Negative.   Psychiatric/Behavioral: Negative.   All other systems reviewed and are negative.     Labs/Other Tests and Data Reviewed:    Recent Labs: 12/28/2017: ALT 10; BUN 16; Creatinine, Ser 0.76; Hemoglobin 12.3; Platelets 198; Potassium 4.3; Sodium 140; TSH 4.149   Recent Lipid Panel Lab Results  Component Value Date/Time   CHOL 152 12/14/2015 10:09 AM   TRIG 98.0 12/14/2015 10:09 AM   HDL 37.60 (L) 12/14/2015 10:09 AM   CHOLHDL 4 12/14/2015 10:09 AM   LDLCALC 95 12/14/2015 10:09 AM    Wt Readings from Last 3 Encounters:  05/15/18 (!) 314 lb (142.4 kg)  03/20/18 (!) 308 lb (139.7 kg)  02/08/18  289 lb (131.1 kg)     Exam:    Vital Signs:  There were no vitals taken for this visit.  Well nourished, well developed female in no acute distress.   ASSESSMENT & PLAN:     1. PAF (paroxysmal atrial fibrillation) (HCC) No sx, doing well, no med changes  2. Essential hypertension Running low Discussed orthostatic symptoms to watch for If blood pressure does run low would hold the amlodipine  3. Mixed hyperlipidemia Cholesterol is at goal on the current lipid regimen. No changes to the medications were made.  4. Rheumatoid arthritis, involving unspecified site, unspecified rheumatoid factor presence (HCC) Significant pain, numerous joints affected with pain, fractures Resting at home  5.  Morbid obesity Unable to exercise, weight is actually trending upward We have encouraged continued exercise, careful diet management in an effort to lose weight.   COVID-19 Education: The signs and symptoms of COVID-19 were discussed with the patient and how to seek care for testing (follow up with PCP or arrange E-visit).  The importance of social distancing was discussed today.  Patient Risk:   After full review of this patients clinical status, I feel that they are at least moderate risk at this time.  Time:   Today, I have spent 25 minutes with the patient with telehealth technology discussing  Atrial fib, joint pain, fractures, low BP, orthostasis, balance, weakness, deconditioning.     Medication Adjustments/Labs and Tests Ordered: Current medicines are reviewed at length with the patient today.  Concerns regarding medicines are outlined above.   Tests Ordered: No tests ordered   Medication Changes: No changes made   Disposition: Follow-up in 6 months   Signed, Ida Rogue, MD  05/27/2018 8:32 AM    [place] [facility] [id number], [place], [place] [id number]

## 2018-05-27 NOTE — Patient Instructions (Signed)
Medication Instructions:  Needs refill on pradaxa and potassium, do not fill until end of the week  If you need a refill on your cardiac medications before your next appointment, please call your pharmacy.    Lab work: No new labs needed   If you have labs (blood work) drawn today and your tests are completely normal, you will receive your results only by: Marland Kitchen MyChart Message (if you have MyChart) OR . A paper copy in the mail If you have any lab test that is abnormal or we need to change your treatment, we will call you to review the results.   Testing/Procedures: No new testing needed   Follow-Up: At [facility], you and your health needs are our priority.  As part of our continuing mission to provide you with exceptional heart care, we have created designated Provider Care Teams.  These Care Teams include your primary Cardiologist (physician) and Advanced Practice Providers (APPs -  Physician Assistants and Nurse Practitioners) who all work together to provide you with the care you need, when you need it.  . You will need a follow up appointment in 12 months .   Please call our office 2 months in advance to schedule this appointment.    . Providers on your designated Care Team:   . Murray Hodgkins, NP . Christell Faith, PA-C . Marrianne Mood, PA-C  Any Other Special Instructions Will Be Listed Below (If Applicable).  For educational health videos Log in to : [email or web address] Or : [email or web address], password : [email or web address]

## 2018-05-29 NOTE — Telephone Encounter (Signed)
Virtual Visit Pre-Appointment Phone Call  Steps For Call:  1. Confirm consent - "In the setting of the current Covid19 crisis, you are scheduled for a (phone or video) visit with your provider on (date) at (time).  Just as we do with many in-office visits, in order for you to participate in this visit, we must obtain consent.  If you'd like, I can send this to your mychart (if signed up) or email for you to review.  Otherwise, I can obtain your verbal consent now.  All virtual visits are billed to your insurance company just like a normal visit would be.  By agreeing to a virtual visit, we'd like you to understand that the technology does not allow for your provider to perform an examination, and thus may limit your provider's ability to fully assess your condition.  Finally, though the technology is pretty good, we cannot assure that it will always work on either your or our end, and in the setting of a video visit, we may have to convert it to a phone-only visit.  In either situation, we cannot ensure that we have a secure connection.  Are you willing to proceed?"  2. Give patient instructions for WebEx download to smartphone as below if video visit  3. Advise patient to be prepared with any vital sign or heart rhythm information, their current medicines, and a piece of paper and pen handy for any instructions they may receive the day of their visit  4. Inform patient they will receive a phone call 15 minutes prior to their appointment time (may be from unknown caller ID) so they should be prepared to answer  5. Confirm that appointment type is correct in Epic appointment notes (video vs telephone)    TELEPHONE CALL NOTE  Tina Pacheco has been deemed a candidate for a follow-up tele-health visit to limit community exposure during the Covid-19 pandemic. I spoke with the patient via phone to ensure availability of phone/video source, confirm preferred email & phone number, and discuss  instructions and expectations.  I reminded Tina Pacheco to be prepared with any vital sign and/or heart rhythm information that could potentially be obtained via home monitoring, at the time of her visit. I reminded Tina Pacheco to expect a phone call at the time of her visit if her visit.  Did the patient verbally acknowledge consent to treatment? yes  Clarisse Gouge 05/29/2018 3:20 PM   DOWNLOADING THE [place], go to [employer] and type in WebEx in the search bar. [place] [employer], the blue/green circle. The app is free but as with any other app downloads, their phone may require them to verify saved payment information or Apple password. The patient does NOT have to create an account.  - If Android, ask patient to go to [employer] and type in WebEx in the search bar. [place] [employer], the blue/green circle. The app is free but as with any other app downloads, their phone may require them to verify saved payment information or Android password. The patient does NOT have to create an account.   CONSENT FOR TELE-HEALTH VISIT - PLEASE REVIEW  I hereby voluntarily request, consent and authorize [facility] and its employed or contracted physicians, physician assistants, nurse practitioners or other licensed health care professionals (the Practitioner), to provide me with telemedicine health care services (the Services") as deemed necessary by the treating Practitioner. I  acknowledge and consent to receive the Services by the Practitioner via telemedicine. I understand that the telemedicine visit will involve communicating with the Practitioner through live audiovisual communication technology and the disclosure of certain medical information by electronic transmission. I acknowledge that I have been given the opportunity to request an in-person assessment or other available alternative prior to the telemedicine visit and am  voluntarily participating in the telemedicine visit.  I understand that I have the right to withhold or withdraw my consent to the use of telemedicine in the course of my care at any time, without affecting my right to future care or treatment, and that the Practitioner or I may terminate the telemedicine visit at any time. I understand that I have the right to inspect all information obtained and/or recorded in the course of the telemedicine visit and may receive copies of available information for a reasonable fee.  I understand that some of the potential risks of receiving the Services via telemedicine include:   Delay or interruption in medical evaluation due to technological equipment failure or disruption;  Information transmitted may not be sufficient (e.g. poor resolution of images) to allow for appropriate medical decision making by the Practitioner; and/or   In rare instances, security protocols could fail, causing a breach of personal health information.  Furthermore, I acknowledge that it is my responsibility to provide information about my medical history, conditions and care that is complete and accurate to the best of my ability. I acknowledge that Practitioner's advice, recommendations, and/or decision may be based on factors not within their control, such as incomplete or inaccurate data provided by me or distortions of diagnostic images or specimens that may result from electronic transmissions. I understand that the practice of medicine is not an exact science and that Practitioner makes no warranties or guarantees regarding treatment outcomes. I acknowledge that I will receive a copy of this consent concurrently upon execution via email to the email address I last provided but may also request a printed copy by calling the office of [facility].    I understand that my insurance will be billed for this visit.   I have read or had this consent read to me.  I understand the  contents of this consent, which adequately explains the benefits and risks of the Services being provided via telemedicine.   I have been provided ample opportunity to ask questions regarding this consent and the Services and have had my questions answered to my satisfaction.  I give my informed consent for the services to be provided through the use of telemedicine in my medical care  By participating in this telemedicine visit I agree to the above.

## 2018-06-03 ENCOUNTER — Other Ambulatory Visit: Payer: Self-pay | Admitting: Internal Medicine

## 2018-06-03 ENCOUNTER — Other Ambulatory Visit: Payer: Self-pay | Admitting: Cardiovascular Disease

## 2018-06-03 DIAGNOSIS — S92014D Nondisplaced fracture of body of right calcaneus, subsequent encounter for fracture with routine healing: Secondary | ICD-10-CM | POA: Diagnosis not present

## 2018-06-03 DIAGNOSIS — M79671 Pain in right foot: Secondary | ICD-10-CM | POA: Diagnosis not present

## 2018-06-03 MED ORDER — OXYCODONE HCL 10 MG PO TABS: 10.0000 mg | ORAL_TABLET | Freq: Two times a day (BID) | ORAL | 0 refills | Status: DC | PRN

## 2018-06-03 MED ORDER — DABIGATRAN ETEXILATE MESYLATE 150 MG PO CAPS: 150.0000 mg | ORAL_CAPSULE | Freq: Two times a day (BID) | ORAL | 1 refills | Status: DC

## 2018-06-03 NOTE — Telephone Encounter (Signed)
Rx for Pradaxa sent to [facility]

## 2018-06-03 NOTE — Telephone Encounter (Signed)
Name of Medication:oxycodone 10 mg  Name of Pharmacy:[facility] [address]  Last Fill or Written Date and Quantity: #120 on 05/06/18 Last Office Visit and Type:05/15/18 acute  Next Office Visit and Type:06/19/18 3 mth FU  Last Controlled Substance Agreement Date: 03/25/18 Last UDS:03/20/18

## 2018-06-03 NOTE — Telephone Encounter (Signed)
°*  STAT* If patient is at the pharmacy, call can be transferred to refill team.   1. Which medications need to be refilled? (please list name of each medication and dose if known) Pradaxa 150 MG 1 capsule 2 times daily   2. Which pharmacy/location (including street and city if local pharmacy) is medication to be sent to? [facility] [phone] - patient states prescription was sent to [facility], patient would like to cancel that order and switch to [facility].  Do not switch the potassium, that will stay with [facility].   3. Do they need a 30 day or 90 day supply? 90 day

## 2018-06-03 NOTE — Telephone Encounter (Signed)
Caller Name: Arantxa Piercey Relationship to Patient: self Best Number: [phone] Pharmacy:  [facility]  Reason for call: Hydrocodone refill, has 2 days left

## 2018-06-17 ENCOUNTER — Telehealth: Payer: Self-pay

## 2018-06-17 NOTE — Telephone Encounter (Signed)
I spoke to patient and she wanted to make sure her office visit would still be the virtual office visit.  I let her know it'll be the virtual office visit.

## 2018-06-17 NOTE — Telephone Encounter (Signed)
[facility] Night - Client Nonclinical Telephone Record [facility] Night - Client Client Site [place] Physician Viviana Simpler - MD Contact Type Call Who Is Calling Patient / Member / Family / Caregiver Caller Name Brecklynn Jian Caller Phone Number [phone] Patient Name Tina Pacheco Patient DOB December 21, 1950 Call Type Message Only Information Provided Reason for Call Request for General Office Information Initial Comment Caller states she got a reminder for an appointment she has and she is wanting to discuss this. Additional Comment Call Closed By: Memory Argue Transaction Date/Time: 06/14/2018 5:35:36 PM (ET)

## 2018-06-19 ENCOUNTER — Encounter: Payer: Self-pay | Admitting: Internal Medicine

## 2018-06-19 ENCOUNTER — Ambulatory Visit (INDEPENDENT_AMBULATORY_CARE_PROVIDER_SITE_OTHER): Payer: Medicare Other | Admitting: Internal Medicine

## 2018-06-19 DIAGNOSIS — F39 Unspecified mood [affective] disorder: Secondary | ICD-10-CM | POA: Diagnosis not present

## 2018-06-19 DIAGNOSIS — F1120 Opioid dependence, uncomplicated: Secondary | ICD-10-CM

## 2018-06-19 DIAGNOSIS — G894 Chronic pain syndrome: Secondary | ICD-10-CM

## 2018-06-19 NOTE — Assessment & Plan Note (Signed)
PDMP review shows no concerns

## 2018-06-19 NOTE — Assessment & Plan Note (Signed)
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

## 2018-06-19 NOTE — Assessment & Plan Note (Signed)
Ongoing back, foot and other pains She wonders about increasing oxycodone----I suggested that it would be better to add the duloxetine if she felt she needed more Rx She wishes to hold off on this for now

## 2018-06-19 NOTE — Progress Notes (Signed)
Subjective:    Patient ID: Tina Pacheco, female    DOB: 24-May-1950, 68 y.o.   MRN: [id number]  HPI Virtual visit for follow up of mood issues and chronic pain/narcotic dependence Identification done Reviewed billing and consent given She is at home--I am in my office  Chronic pain in low back Has limited her walking---but still in boot from her foot surgery  Mood is "not that great" Does cry fairly regularly Usually stays home anyway--so not affected by pandemic Daughter or husband do the shopping Emotional with certain TV shows---"but more than just that" Doesn't feel overly depressed Not really anhedonic--but less pleasure. Doesn't even enjoy reading as much Does take dog out every couple of hours---sits out while he is out on a leash  Still needs the 4 oxycodone daily Tries to delay the first dose Sleeps in the recliner due to pain---oxycodone helps if near bedtime  Current Outpatient Medications on File Prior to Visit  Medication Sig Dispense Refill  . amLODipine (NORVASC) 5 MG tablet Take 5 mgs by mouth once daily in the evening 90 tablet 3  . atorvastatin (LIPITOR) 20 MG tablet TAKE 1 TABLET DAILY AT 6PM 90 tablet 0  . dabigatran (PRADAXA) 150 MG CAPS capsule Take 1 capsule (150 mg total) by mouth 2 (two) times daily. 180 capsule 1  . diphenhydrAMINE (BENADRYL) 25 MG tablet Take 25 mg by mouth daily as needed for allergies.    . febuxostat (ULORIC) 40 MG tablet Take 40 mg by mouth daily.    . folic acid (FOLVITE) 1 MG tablet Take 1 mg by mouth daily.    Marland Kitchen gabapentin (NEURONTIN) 300 MG capsule TAKE 1 CAPSULE BY MOUTH THREE TIMES A DAY(TAKE 1 CAPSULE 2-3 HOURS BEFORE BEDTIME) 90 capsule 5  . hydroxychloroquine (PLAQUENIL) 200 MG tablet Take 200 mg by mouth 2 (two) times daily.     Marland Kitchen leflunomide (ARAVA) 20 MG tablet Take 20 mg by mouth every morning.     . meclizine (ANTIVERT) 25 MG tablet Take 1 tablet (25 mg total) by mouth 3 (three) times daily. 90 tablet 11  .  metoprolol succinate (TOPROL-XL) 50 MG 24 hr tablet TAKE 1 TABLET BY MOUTH ONCE DAILY WITH OR IMMEDIATELY FOLLOWING A MEAL 90 tablet 3  . montelukast (SINGULAIR) 10 MG tablet TAKE 1 TABLET BY MOUTH AT BEDTIME 90 tablet 3  . Multiple Vitamin (MULTIVITAMIN) tablet Take 1 tablet by mouth daily.     . Oxycodone HCl 10 MG TABS Take 1-2 tablets (10-20 mg total) by mouth 2 (two) times daily as needed. 120 tablet 0  . polyethylene glycol (MIRALAX / GLYCOLAX) packet Take 17 g by mouth daily as needed for mild constipation. 14 each 0  . potassium chloride SA (KLOR-CON M20) 20 MEQ tablet TAKE 1/2 TABLET (=10MEQ) TWO TIMES A DAY ; MAY TAKE AND EXTRA TABLET WHEN TAKING TORSEMIDE 180 tablet 3  . promethazine (PHENERGAN) 25 MG tablet TK 1/2 TO 1 T PO TID PRN N  1  . SF 5000 PLUS 1.1 % CREA dental cream Place 1 application onto teeth 2 (two) times daily.  1  . sodium chloride (OCEAN) 0.65 % SOLN nasal spray Place 1 spray into both nostrils as needed for congestion.    Marland Kitchen terazosin (HYTRIN) 10 MG capsule TAKE 1 CAPSULE BY MOUTH AT BEDTIME 90 capsule 3  . tolterodine (DETROL LA) 2 MG 24 hr capsule Take 1 capsule (2 mg total) by mouth daily as needed (bladder spasms). 30 capsule  5  . torsemide (DEMADEX) 20 MG tablet Take 20 mg by mouth twice daily as needed for swelling 60 tablet 3   No current facility-administered medications on file prior to visit.     Allergies  Allergen Reactions  . Codeine     Nausea and vomiting    Past Medical History:  Diagnosis Date  . Arthritis    RA  . Asthma   . Basal cell carcinoma   . Cataract 2018   bilateral eyes; corrected with surgery  . Claustrophobia   . Collagen vascular disease (HCC)    RA  . COPD (chronic obstructive pulmonary disease) ([place])   . Diastolic dysfunction    a. echo 07/2014: EF 55-60%, no RWMA, GR2DD, mild MR, LA moderately dilated, PASP 38 mm Hg  . Dyslipidemia   . Dysrhythmia   . Headache    migraines  . Hemihypertrophy   . History of cardiac  cath    a. 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 History of gout   . Hyperplastic colonic polyp 2003  . Hypertension   . Hypokalemia   . Morbid obesity ([place])   . PAF (paroxysmal atrial fibrillation) (HCC)    a. on Pradaxa; b. CHADSVASc at least 2 (HTN & female)  . Rheumatoid arthritis(714.0)   . Sleep apnea    a. not compliant with CPAP    Past Surgical History:  Procedure Laterality Date  . ABDOMINAL HYSTERECTOMY    . APPLICATION OF WOUND VAC Right 08/09/2017   Procedure: APPLICATION OF WOUND VAC;  Surgeon: Albertine Patricia, DPM;  Location: [facility];  Service: Podiatry;  Laterality: Right;  . CARDIAC CATHETERIZATION  05/24/2010   nonobstructive CAD  . CATARACT EXTRACTION W/ INTRAOCULAR LENS IMPLANT Right 06/12/2016   Dr. Darleen Crocker  . CATARACT EXTRACTION W/ INTRAOCULAR LENS IMPLANT Left 06/26/2016   Dr. Darleen Crocker  . CESAREAN SECTION    . CHOLECYSTECTOMY    . COLONOSCOPY  06/12/2011   Procedure: COLONOSCOPY;  Surgeon: Juanita Craver, MD;  Location: [facility];  Service: Endoscopy;  Laterality: N/A;  . COLONOSCOPY N/A 03/17/2013   Procedure: COLONOSCOPY;  Surgeon: Juanita Craver, MD;  Location: [facility];  Service: Endoscopy;  Laterality: N/A;  . EYE SURGERY    . FOOT ARTHRODESIS Right 07/13/2017   Procedure: ARTHRODESIS FOOT-MULTI.FUSIONS (6 JOINTS);  Surgeon: Albertine Patricia, DPM;  Location: [facility];  Service: Podiatry;  Laterality: Right;  . IRRIGATION AND DEBRIDEMENT FOOT Right 08/09/2017   Procedure: IRRIGATION AND DEBRIDEMENT FOOT;  Surgeon: Albertine Patricia, DPM;  Location: [facility];  Service: Podiatry;  Laterality: Right;  . KNEE ARTHROSCOPY     bilateral  . SINUSOTOMY    . TOOTH EXTRACTION  12/2016  . VAGINAL HYSTERECTOMY      Family History  Problem Relation Age of Onset  . Emphysema Father        smoked  . Tuberculosis Mother   . Parkinsonism Mother   . Diabetes type II Sister   . Breast cancer Sister   . Breast cancer Maternal Aunt   .  Tuberculosis Sister     Social History   Socioeconomic History  . Marital status: Married    Spouse name: Not on file  . Number of children: 1  . Years of education: Not on file  . Highest education level: Not on file  Occupational History  . 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 Financial resource strain:  Not on file  . Food insecurity:    Worry: Not on file    Inability: Not on file  . Transportation needs:    Medical: Not on file    Non-medical: Not on file  Tobacco Use  . Smoking status: Never Smoker  . Smokeless tobacco: Never Used  Substance and Sexual Activity  . Alcohol use: No  . Drug use: No  . Sexual activity: Not on file  Lifestyle  . Physical activity:    Days per week: Not on file    Minutes per session: Not on file  . Stress: Not on file  Relationships  . Social connections:    Talks on phone: Not on file    Gets together: Not on file    Attends religious service: Not on file    Active member of club or organization: Not on file    Attends meetings of clubs or organizations: Not on file    Relationship status: Not on file  . Intimate partner violence:    Fear of current or ex partner: Not on file    Emotionally abused: Not on file    Physically abused: Not on file    Forced sexual activity: Not on file  Other Topics Concern  . Not on file  Social History Narrative   No living will   Husband, then daughter should be decision maker   Would accept resuscitation attempts   Not sure about tube feeds   Review of Systems Flank pain is better---as long as she doesn't lean over the dryer Appetite is off---husband not feeling great so not cooking like he has in the past. Has to force herself at times Weight is stable Will get scattered pain---like in shoulders    Objective:   Physical Exam  Constitutional: She appears well-developed. No distress.  Respiratory: Effort normal. No respiratory  distress.  Psychiatric:  Doesn't seem depressed Fairly lively conversation Appropriate affect           Assessment & Plan:

## 2018-06-25 DIAGNOSIS — S92014D Nondisplaced fracture of body of right calcaneus, subsequent encounter for fracture with routine healing: Secondary | ICD-10-CM | POA: Diagnosis not present

## 2018-07-01 ENCOUNTER — Other Ambulatory Visit: Payer: Self-pay | Admitting: Internal Medicine

## 2018-07-01 DIAGNOSIS — M1A00X0 Idiopathic chronic gout, unspecified site, without tophus (tophi): Secondary | ICD-10-CM | POA: Diagnosis not present

## 2018-07-01 DIAGNOSIS — Z79899 Other long term (current) drug therapy: Secondary | ICD-10-CM | POA: Diagnosis not present

## 2018-07-01 DIAGNOSIS — M069 Rheumatoid arthritis, unspecified: Secondary | ICD-10-CM | POA: Diagnosis not present

## 2018-07-01 MED ORDER — OXYCODONE HCL 10 MG PO TABS: 10.0000 mg | ORAL_TABLET | Freq: Two times a day (BID) | ORAL | 0 refills | Status: DC | PRN

## 2018-07-01 NOTE — Telephone Encounter (Signed)
Name of Medication: Oxycodone 10 mg Name of Pharmacy:[facility] [address]  Last Fill or Written Date and Quantity: #120 on 06/03/18 Last Office Visit and Type:06/19/18  FU pain mgt Next Office Visit and Type:09/18/18 for 3 mth FU  Last Controlled Substance Agreement Date:03/25/18  Last UDS:03/20/18

## 2018-07-01 NOTE — Telephone Encounter (Signed)
Pt called to get refill of oxycodone sent to [facility]. Pt has 2 days left

## 2018-07-18 ENCOUNTER — Other Ambulatory Visit: Payer: Self-pay | Admitting: Internal Medicine

## 2018-07-18 NOTE — Telephone Encounter (Signed)
The Detrol was last filled 06/2017 with 5 refills... please advise if appropriate to refill

## 2018-07-24 DIAGNOSIS — M19071 Primary osteoarthritis, right ankle and foot: Secondary | ICD-10-CM | POA: Diagnosis not present

## 2018-07-24 DIAGNOSIS — G8929 Other chronic pain: Secondary | ICD-10-CM | POA: Diagnosis not present

## 2018-07-24 DIAGNOSIS — M79671 Pain in right foot: Secondary | ICD-10-CM | POA: Diagnosis not present

## 2018-07-24 DIAGNOSIS — T849XXD Unspecified complication of internal orthopedic prosthetic device, implant and graft, subsequent encounter: Secondary | ICD-10-CM | POA: Diagnosis not present

## 2018-07-24 DIAGNOSIS — S92014D Nondisplaced fracture of body of right calcaneus, subsequent encounter for fracture with routine healing: Secondary | ICD-10-CM | POA: Diagnosis not present

## 2018-07-29 ENCOUNTER — Other Ambulatory Visit: Payer: Self-pay | Admitting: Internal Medicine

## 2018-07-29 MED ORDER — OXYCODONE HCL 10 MG PO TABS: 10.0000 mg | ORAL_TABLET | Freq: Two times a day (BID) | ORAL | 0 refills | Status: DC | PRN

## 2018-07-29 NOTE — Telephone Encounter (Signed)
Name of Medication: Oxycodone Name of Pharmacy: [facility] or Written Date and Quantity: 07-01-18 #120 Last Office Visit and Type: 06-19-18 Next Office Visit and Type: 09-18-18 Last Controlled Substance Agreement Date: 03-20-18  Last UDS: 03-20-18

## 2018-07-29 NOTE — Telephone Encounter (Signed)
Best number [phone] Pt stated she was told to call her for rx refill Oxycodone [facility] [address] [place] Pt has 1 day left

## 2018-07-31 DIAGNOSIS — M069 Rheumatoid arthritis, unspecified: Secondary | ICD-10-CM | POA: Diagnosis not present

## 2018-07-31 DIAGNOSIS — M25562 Pain in left knee: Secondary | ICD-10-CM | POA: Diagnosis not present

## 2018-07-31 DIAGNOSIS — Z79899 Other long term (current) drug therapy: Secondary | ICD-10-CM | POA: Diagnosis not present

## 2018-07-31 DIAGNOSIS — M170 Bilateral primary osteoarthritis of knee: Secondary | ICD-10-CM | POA: Diagnosis not present

## 2018-07-31 DIAGNOSIS — M25561 Pain in right knee: Secondary | ICD-10-CM | POA: Diagnosis not present

## 2018-07-31 DIAGNOSIS — M1A00X0 Idiopathic chronic gout, unspecified site, without tophus (tophi): Secondary | ICD-10-CM | POA: Diagnosis not present

## 2018-07-31 DIAGNOSIS — G8929 Other chronic pain: Secondary | ICD-10-CM | POA: Diagnosis not present

## 2018-08-01 DIAGNOSIS — M1712 Unilateral primary osteoarthritis, left knee: Secondary | ICD-10-CM | POA: Diagnosis not present

## 2018-08-01 DIAGNOSIS — M1711 Unilateral primary osteoarthritis, right knee: Secondary | ICD-10-CM | POA: Diagnosis not present

## 2018-08-19 DIAGNOSIS — S92014D Nondisplaced fracture of body of right calcaneus, subsequent encounter for fracture with routine healing: Secondary | ICD-10-CM | POA: Diagnosis not present

## 2018-08-26 ENCOUNTER — Other Ambulatory Visit: Payer: Self-pay | Admitting: Internal Medicine

## 2018-08-26 MED ORDER — OXYCODONE HCL 10 MG PO TABS: 10.0000 mg | ORAL_TABLET | Freq: Two times a day (BID) | ORAL | 0 refills | Status: DC | PRN

## 2018-08-26 MED ORDER — AMLODIPINE BESYLATE 5 MG PO TABS: ORAL_TABLET | ORAL | 3 refills | Status: DC

## 2018-08-26 NOTE — Telephone Encounter (Signed)
Name of Medication:oxycodone 10 mg  Name of Pharmacy:[facility] [address]  Last Fill or Written Date and Quantity: # 120 on 07/29/18 Last Office Visit and Type: 06/19/18 FU Next Office Visit and Type: 09/18/18 for 3 mth FU Last Controlled Substance Agreement Date: 03/25/18 Last UDS:03/20/18  Pt has 1 day of meds left.

## 2018-08-26 NOTE — Telephone Encounter (Signed)
Patient is also requesting a new prescription sent into the pharmacy,   Amlodipine 5 MG tablet   Tina Pacheco

## 2018-08-26 NOTE — Telephone Encounter (Signed)
Patient requesting refill  OXYCODONE  Patient has 1 day left    [place]   Patient C/B #  [phone]

## 2018-08-26 NOTE — Telephone Encounter (Signed)
Pt request refill amlodipine; last filled # 90 x 3 on 08/11/17 by Dr Bettey Costa.Please advise.

## 2018-09-18 ENCOUNTER — Other Ambulatory Visit: Payer: Self-pay

## 2018-09-18 ENCOUNTER — Encounter: Payer: Self-pay | Admitting: Internal Medicine

## 2018-09-18 ENCOUNTER — Ambulatory Visit (INDEPENDENT_AMBULATORY_CARE_PROVIDER_SITE_OTHER): Payer: Medicare Other | Admitting: Internal Medicine

## 2018-09-18 DIAGNOSIS — G894 Chronic pain syndrome: Secondary | ICD-10-CM

## 2018-09-18 DIAGNOSIS — M79605 Pain in left leg: Secondary | ICD-10-CM | POA: Diagnosis not present

## 2018-09-18 DIAGNOSIS — M62838 Other muscle spasm: Secondary | ICD-10-CM | POA: Diagnosis not present

## 2018-09-18 DIAGNOSIS — F1120 Opioid dependence, uncomplicated: Secondary | ICD-10-CM | POA: Diagnosis not present

## 2018-09-18 DIAGNOSIS — G2581 Restless legs syndrome: Secondary | ICD-10-CM

## 2018-09-18 NOTE — Progress Notes (Signed)
   Subjective:    Patient ID: Tina Pacheco, female    DOB: 02/14/1951, 68 y.o.   MRN: [id number]  HPI Here for review of chronic pain syndrome--and she has other issues as well  Having pain in the back of her left leg--hamstring problem Will radiate down past her knee Not able to bend over like usual----limited due to the leg (has to pull herself up on something) Sharp stabbing pain--down below her knee when trying to step up out of pool using that leg Doesn't feel like past sciatica Doesn't remember any injury Everything is weak--nothing particularly with that leg Trouble sitting even on regular chair due to pain Left leg always bigger than right---vascular evaluation negative in past Right foot fracture is better--but still not normal  Has had 3-4 episodes of sharp right shoulder pain "like someone with a sharp flat headed screwdriver was stabbing into my shoulder" Pain starts in mid trapezius and radiates down shoulder to elbow Heat does help some Review of Systems No chest pain Chronic easy dyspnea Some trouble getting comfortable sleeping---can't lie down    Objective:   Physical Exam  Constitutional: No distress.  Musculoskeletal:     Comments: No sig calf tenderness Does have tenderness along left adductor ----not really posteriorly in the calf  Trapezius on right is tight/spasm--very tender ROM okay in shoulder  Neurological:  No weakness in arms or grip strength  Psychiatric:  Mild depression Affect is appropriate           Assessment & Plan:

## 2018-09-18 NOTE — Assessment & Plan Note (Signed)
Seems to be worse since off lyrica

## 2018-09-18 NOTE — Patient Instructions (Signed)
Please increase the gabapentin to 300mg  capsule twice a day and 2 of the capsules at bedtime. We can consider increasing the daytime doses as well

## 2018-09-18 NOTE — Assessment & Plan Note (Signed)
Ongoing severe disability Continues on complex pain regimen

## 2018-09-18 NOTE — Assessment & Plan Note (Signed)
PDMP reviewed No concerns 

## 2018-09-18 NOTE — Assessment & Plan Note (Signed)
New for her Nothing to suggest DVT Unclear if this might be radicular or related to muscle injury Not sciatica Consider sports medicine or ortho evaluation Not sure if it could be related to walking differently with right foot fracture

## 2018-09-18 NOTE — Assessment & Plan Note (Signed)
Discussed heat and massage I doubt this is cervical radiculopathy--but possible Has been off the lyrica--- stopped it and didn't notice a difference at first Not sure if this is related

## 2018-09-19 ENCOUNTER — Other Ambulatory Visit: Payer: Self-pay

## 2018-09-19 DIAGNOSIS — R6889 Other general symptoms and signs: Secondary | ICD-10-CM | POA: Diagnosis not present

## 2018-09-19 DIAGNOSIS — Z20822 Contact with and (suspected) exposure to covid-19: Secondary | ICD-10-CM

## 2018-09-20 NOTE — Progress Notes (Signed)
Khalon Cansler T. Sandrea Boer, MD Primary Care and [place] at [facility] [place], [address] Phone: [phone]  FAX: [place] - 68 y.o. female  MRN [id number]  Date of Birth: [birth year]  Visit Date: 09/23/2018  PCP: Venia Carbon, MD  Referred by: Venia Carbon, MD  Chief Complaint  Patient presents with  . Leg Pain    Left x 1 month  . Shoulder Pain    Bilateral   Subjective:   Tina Pacheco is a 69 y.o. very pleasant female patient who presents with the following:  68 yo with RA on Plaquenil, Body mass index is 52.13 kg/m., on chronic narcotics who presents with leg and UE pain.  Sees Dr. Meda Coffee, was seeing Garrett.  R  Shoulder, has had them for years.  R > L shoulder.  Starts very sharp.  Will wake up Rom ? Limited.  Not injured.  No radicular symptoms.   She is basically here for bilateral shoulder pain, right worse than left.  This is been an intermittent thing off and on for years.  Is particular gotten worse over the last few weeks.  In the past she has been told that she has "bone spurs".  She has not had any traumatic injury, nor does she have any prior surgery of the shoulders.  No significant neck history.  Past Medical History, Surgical History, Social History, Family History, Problem List, Medications, and Allergies have been reviewed and updated if relevant.  Patient Active Problem List   Diagnosis Date Noted  . Left leg pain 09/18/2018  . Chest wall pain 05/15/2018  . Preventative health care 03/20/2018  . Mood disorder ([place]) 03/20/2018  . Advance directive discussed with patient 03/20/2018  . Ongoing leg pain 12/16/2017  . Lymphedema 11/26/2017  . Chronic diastolic heart failure ([place]) 07/06/2017  . Nausea 05/08/2017  . Narcotic dependence ([place]) 03/09/2017  . Vertigo 06/28/2016  . Bronchiectasis without acute exacerbation ([place]) 06/15/2015  . DDD  (degenerative disc disease), lumbosacral 03/30/2015  . Sleep apnea   . Coronary artery disease, non-occlusive 08/25/2014  . PAF (paroxysmal atrial fibrillation) ([place]) 08/25/2014  . 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 RLS (restless legs syndrome) 07/06/2014  . Gout 05/26/2014  . Chronic pain syndrome 05/26/2014  . Migraine 04/08/2014  . Back pain 07/22/2013  . Metatarsalgia of right foot 07/22/2013  . Insomnia 07/15/2013  . OA (osteoarthritis) 11/20/2012  . DJD (degenerative joint disease) of cervical spine 09/10/2012  . Trapezius muscle spasm 07/21/2011  . Allergic rhinitis 07/10/2011  . Rheumatoid arthritis ([place]) 03/06/2011  . Morbid obesity ([place]) 06/22/2010  . Hyperlipidemia 06/22/2010  . HTN (hypertension) 06/22/2010  . Diastolic dysfunction 16/02/930    Past Medical History:  Diagnosis Date  . Arthritis    RA  . Asthma   . Basal cell carcinoma   . Cataract 2018   bilateral eyes; corrected with surgery  . Claustrophobia   . Collagen vascular disease (HCC)    RA  . COPD (chronic obstructive pulmonary disease) ([place])   . Diastolic dysfunction    a. echo 07/2014: EF 55-60%, no RWMA, GR2DD, mild MR, LA moderately dilated, PASP 38 mm Hg  . Dyslipidemia   . Dysrhythmia   . Headache    migraines  . Hemihypertrophy   . History of cardiac cath    a. 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 History of  gout   . Hyperplastic colonic polyp 2003  . Hypertension   . Hypokalemia   . Morbid obesity ([place])   . PAF (paroxysmal atrial fibrillation) (HCC)    a. on Pradaxa; b. CHADSVASc at least 2 (HTN & female)  . Rheumatoid arthritis(714.0)   . Sleep apnea    a. not compliant with CPAP    Past Surgical History:  Procedure Laterality Date  . ABDOMINAL HYSTERECTOMY    . APPLICATION OF WOUND VAC Right 08/09/2017   Procedure: APPLICATION OF WOUND VAC;  Surgeon: Albertine Patricia, DPM;  Location: [facility] OR[facility];  Service: Podiatry;  Laterality: Right;  . CARDIAC  CATHETERIZATION  05/24/2010   nonobstructive CAD  . CATARACT EXTRACTION W/ INTRAOCULAR LENS IMPLANT Right 06/12/2016   Dr. Darleen Crocker  . CATARACT EXTRACTION W/ INTRAOCULAR LENS IMPLANT Left 06/26/2016   Dr. Darleen Crocker  . CESAREAN SECTION    . CHOLECYSTECTOMY    . COLONOSCOPY  06/12/2011   Procedure: COLONOSCOPY;  Surgeon: Juanita Craver, MD;  Location: [facility];  Service: Endoscopy;  Laterality: N/A;  . COLONOSCOPY N/A 03/17/2013   Procedure: COLONOSCOPY;  Surgeon: Juanita Craver, MD;  Location: [facility];  Service: Endoscopy;  Laterality: N/A;  . EYE SURGERY    . FOOT ARTHRODESIS Right 07/13/2017   Procedure: ARTHRODESIS FOOT-MULTI.FUSIONS (6 JOINTS);  Surgeon: Albertine Patricia, DPM;  Location: [facility];  Service: Podiatry;  Laterality: Right;  . IRRIGATION AND DEBRIDEMENT FOOT Right 08/09/2017   Procedure: IRRIGATION AND DEBRIDEMENT FOOT;  Surgeon: Albertine Patricia, DPM;  Location: [facility];  Service: Podiatry;  Laterality: Right;  . KNEE ARTHROSCOPY     bilateral  . SINUSOTOMY    . TOOTH EXTRACTION  12/2016  . VAGINAL HYSTERECTOMY      Social History   Socioeconomic History  . Marital status: Married    Spouse name: Not on file  . Number of children: 1  . Years of education: Not on file  . Highest education level: Not on file  Occupational History  . 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 Financial resource strain: Not on file  . Food insecurity    Worry: Not on file    Inability: Not on file  . Transportation needs    Medical: Not on file    Non-medical: Not on file  Tobacco Use  . Smoking status: Never Smoker  . Smokeless tobacco: Never Used  Substance and Sexual Activity  . Alcohol use: No  . Drug use: No  . Sexual activity: Not on file  Lifestyle  . Physical activity    Days per week: Not on file    Minutes per session: Not on file  . Stress: Not on file  Relationships  . Social Herbalist on  phone: Not on file    Gets together: Not on file    Attends religious service: Not on file    Active member of club or organization: Not on file    Attends meetings of clubs or organizations: Not on file    Relationship status: Not on file  . Intimate partner violence    Fear of current or ex partner: Not on file    Emotionally abused: Not on file    Physically abused: Not on file    Forced sexual activity: Not on file  Other Topics Concern  . Not on file  Social History Narrative   No living will  Husband, then daughter should be decision maker   Would accept resuscitation attempts   Not sure about tube feeds    Family History  Problem Relation Age of Onset  . Emphysema Father        smoked  . Tuberculosis Mother   . Parkinsonism Mother   . Diabetes type II Sister   . Breast cancer Sister   . Breast cancer Maternal Aunt   . Tuberculosis Sister     Allergies  Allergen Reactions  . Codeine     Nausea and vomiting    Medication list reviewed and updated in full in [place].  GEN: No fevers, chills. Nontoxic. Primarily MSK c/o today. MSK: Detailed in the HPI GI: tolerating PO intake without difficulty Neuro: No numbness, parasthesias, or tingling associated. Otherwise the pertinent positives of the ROS are noted above.   Objective:   BP (!) 146/74   Pulse 68   Temp 97.9 F (36.6 C) (Temporal)   Ht 5\' 5"  (1.651 m)   Wt (!) 313 lb 4 oz (142.1 kg)   BMI 52.13 kg/m    GEN: WDWN, NAD, Non-toxic, Alert & Oriented x 3 HEENT: Atraumatic, Normocephalic.  Ears and Nose: No external deformity. EXTR: No clubbing/cyanosis/edema NEURO: Normal gait.  PSYCH: Normally interactive. Conversant. Not depressed or anxious appearing.  Calm demeanor.   Shoulder: R and L Inspection: No muscle wasting or winging Ecchymosis/edema: neg  AC joint, scapula, clavicle: NT Cervical spine: NT, full ROM Spurling's: neg ABNORMAL SIDE TESTED: R and L UNLESS OTHERWISE NOTED,  THE CONTRALATERAL SIDE HAS FULL RANGE OF MOTION. Abduction: 5/5, LIMITED TO 140 on both sides DEGREES Flexion: 5/5, LIMITED To 160 DEG ROM  IR, lift-off: 5/5. TESTED AT 90 DEGREES OF ABDUCTION, LIMITED TO 10 DEGREES ER at neutral:  5/5, TESTED AT 90 DEGREES OF ABDUCTION, LIMITED TO 75 DEGREES AC crossover and compression: PAIN Drop Test: neg Empty Can: neg Supraspinatus insertion: NT Bicipital groove: NT ALL OTHER SPECIAL TESTING EQUIVOCAL GIVEN LOSS OF MOTION C5-T1 intact Sensation intact Grip 5/5    Radiology: Dg Shoulder Right  Result Date: 09/23/2018 CLINICAL DATA:  Chronic pain in both shoulders question osteoarthritis, history of rheumatoid arthritis EXAM: RIGHT SHOULDER - 2+ VIEW COMPARISON:  None FINDINGS: Osseous demineralization. Degenerative changes at RIGHT [facility] joint. No definite fracture, dislocation or bone destruction. Visualized RIGHT ribs unremarkable. IMPRESSION: Osseous demineralization with degenerative changes RIGHT AC joint. Electronically Signed   By: Lavonia Dana M.D.   On: 09/23/2018 16:44   Dg Shoulder Left  Result Date: 09/23/2018 CLINICAL DATA:  Chronic pain in both shoulders question osteoarthritis, history of rheumatoid arthritis EXAM: LEFT SHOULDER - 2+ VIEW COMPARISON:  None FINDINGS: Osseous demineralization. AC joint alignment normal. No definite fracture, dislocation or bone destruction. Visualized LEFT ribs intact. IMPRESSION: No acute abnormalities. Electronically Signed   By: Lavonia Dana M.D.   On: 09/23/2018 16:48    Assessment and Plan:     ICD-10-CM   1. Chronic pain of both shoulders  M25.511 DG Shoulder Right   G89.29 DG Shoulder Left   M25.512   2. Rheumatoid arthritis, involving unspecified site, unspecified rheumatoid factor presence (HCC)  M06.9 DG Shoulder Right    DG Shoulder Left  3. Adhesive capsulitis of right shoulder  M75.01   4. Adhesive capsulitis of left shoulder  M75.02    She functionally has adhesive capsulitis with loss of  motion in all directions.  Minimal glenohumeral arthritis. Pred for all joint complaints  I  gave her home exercise program from [place].  Certainly rheumatoid arthritis could be playing a part of this as well, and I recommended that she discuss some of this with her rheumatologist as well.  She also began talking about some leg pain, but at this point we have been in the office for more than 30 minutes, so she will need to follow-up with me with rheumatology at a later point.  Follow-up: No follow-ups on file.  Meds ordered this encounter  Medications  . predniSONE (DELTASONE) 20 MG tablet    Sig: 2 tabs po for 7 days, then 1 tab po for 7 days    Dispense:  21 tablet    Refill:  0   Orders Placed This Encounter  Procedures  . DG Shoulder Right  . DG Shoulder Left    Signed,  Frederico Hamman T. Shmiel Morton, MD   Outpatient Encounter Medications as of 09/23/2018  Medication Sig  . amLODipine (NORVASC) 5 MG tablet Take 5 mgs by mouth once daily in the evening  . atorvastatin (LIPITOR) 20 MG tablet TAKE 1 TABLET DAILY AT 6PM  . dabigatran (PRADAXA) 150 MG CAPS capsule Take 1 capsule (150 mg total) by mouth 2 (two) times daily.  . diphenhydrAMINE (BENADRYL) 25 MG tablet Take 25 mg by mouth daily as needed for allergies.  . folic acid (FOLVITE) 1 MG tablet Take 1 mg by mouth daily.  Marland Kitchen gabapentin (NEURONTIN) 300 MG capsule TAKE 1 CAPSULE BY MOUTH THREE TIMES A DAY(TAKE 1 CAPSULE 2-3 HOURS BEFORE BEDTIME)  . hydroxychloroquine (PLAQUENIL) 200 MG tablet Take 200 mg by mouth 2 (two) times daily.   Marland Kitchen leflunomide (ARAVA) 20 MG tablet Take 20 mg by mouth every morning.   . meclizine (ANTIVERT) 25 MG tablet Take 1 tablet (25 mg total) by mouth 3 (three) times daily.  . metoprolol succinate (TOPROL-XL) 50 MG 24 hr tablet TAKE 1 TABLET BY MOUTH ONCE DAILY WITH OR IMMEDIATELY FOLLOWING A MEAL  . montelukast (SINGULAIR) 10 MG tablet TAKE 1 TABLET BY MOUTH AT BEDTIME  . Multiple Vitamin (MULTIVITAMIN)  tablet Take 1 tablet by mouth daily.   . polyethylene glycol (MIRALAX / GLYCOLAX) packet Take 17 g by mouth daily as needed for mild constipation.  . potassium chloride SA (KLOR-CON M20) 20 MEQ tablet TAKE 1/2 TABLET (=10MEQ) TWO TIMES A DAY ; MAY TAKE AND EXTRA TABLET WHEN TAKING TORSEMIDE  . promethazine (PHENERGAN) 25 MG tablet TK 1/2 TO 1 T PO TID PRN N  . SF 5000 PLUS 1.1 % CREA dental cream Place 1 application onto teeth 2 (two) times daily.  . sodium chloride (OCEAN) 0.65 % SOLN nasal spray Place 1 spray into both nostrils as needed for congestion.  Marland Kitchen terazosin (HYTRIN) 10 MG capsule TAKE 1 CAPSULE BY MOUTH AT BEDTIME  . tolterodine (DETROL LA) 2 MG 24 hr capsule TAKE 1 CAPSULE(2 MG) BY MOUTH DAILY AS NEEDED FOR BLADDER SPASMS  . torsemide (DEMADEX) 20 MG tablet Take 20 mg by mouth twice daily as needed for swelling  . [DISCONTINUED] Oxycodone HCl 10 MG TABS Take 1-2 tablets (10-20 mg total) by mouth 2 (two) times daily as needed.  . predniSONE (DELTASONE) 20 MG tablet 2 tabs po for 7 days, then 1 tab po for 7 days  . [DISCONTINUED] febuxostat (ULORIC) 40 MG tablet Take 40 mg by mouth daily.   No facility-administered encounter medications on file as of 09/23/2018.

## 2018-09-22 LAB — NOVEL CORONAVIRUS, NAA: SARS-CoV-2, NAA: NOT DETECTED

## 2018-09-23 ENCOUNTER — Ambulatory Visit (INDEPENDENT_AMBULATORY_CARE_PROVIDER_SITE_OTHER): Payer: Medicare Other | Admitting: Family Medicine

## 2018-09-23 ENCOUNTER — Other Ambulatory Visit: Payer: Self-pay

## 2018-09-23 ENCOUNTER — Telehealth: Payer: Self-pay | Admitting: Internal Medicine

## 2018-09-23 ENCOUNTER — Ambulatory Visit (INDEPENDENT_AMBULATORY_CARE_PROVIDER_SITE_OTHER)
Admission: RE | Admit: 2018-09-23 | Discharge: 2018-09-23 | Disposition: A | Discharge status: Home / Self Care | Payer: Medicare Other | Source: Ambulatory Visit | Attending: Family Medicine | Admitting: Family Medicine

## 2018-09-23 ENCOUNTER — Encounter: Payer: Self-pay | Admitting: Family Medicine

## 2018-09-23 VITALS — BP 146/74 | HR 68 | Temp 97.9°F | Ht 65.0 in | Wt 313.2 lb

## 2018-09-23 DIAGNOSIS — M069 Rheumatoid arthritis, unspecified: Secondary | ICD-10-CM

## 2018-09-23 DIAGNOSIS — G8929 Other chronic pain: Secondary | ICD-10-CM

## 2018-09-23 DIAGNOSIS — M19011 Primary osteoarthritis, right shoulder: Secondary | ICD-10-CM | POA: Diagnosis not present

## 2018-09-23 DIAGNOSIS — M25511 Pain in right shoulder: Secondary | ICD-10-CM

## 2018-09-23 DIAGNOSIS — M7502 Adhesive capsulitis of left shoulder: Secondary | ICD-10-CM

## 2018-09-23 DIAGNOSIS — M7501 Adhesive capsulitis of right shoulder: Secondary | ICD-10-CM | POA: Diagnosis not present

## 2018-09-23 DIAGNOSIS — M25512 Pain in left shoulder: Secondary | ICD-10-CM | POA: Diagnosis not present

## 2018-09-23 MED ORDER — PREDNISONE 20 MG PO TABS: ORAL_TABLET | ORAL | 0 refills | Status: DC

## 2018-09-23 MED ORDER — OXYCODONE HCL 10 MG PO TABS: 10.0000 mg | ORAL_TABLET | Freq: Two times a day (BID) | ORAL | 0 refills | Status: DC | PRN

## 2018-09-23 NOTE — Telephone Encounter (Signed)
Name of Medication: oxycodone 10 mg Name of Pharmacy: [facility] [address] Last Fill or Written Date and Quantity:# 120 on 08/26/18  Last Office Visit and Type: 09/23/18 leg pain  Next Office Visit and Type:12/23/18 for 3 mth FU Last Controlled Substance Agreement Date:03/25/18  Last UDS:03/20/18

## 2018-09-23 NOTE — Telephone Encounter (Signed)
Best number [phone] Pt called needs a refill on  Oxycodone [facility] [address] [place] Pt will be out on wednesday

## 2018-09-23 NOTE — Telephone Encounter (Signed)
refilled 

## 2018-09-30 DIAGNOSIS — I87303 Chronic venous hypertension (idiopathic) without complications of bilateral lower extremity: Secondary | ICD-10-CM | POA: Diagnosis not present

## 2018-09-30 DIAGNOSIS — M19071 Primary osteoarthritis, right ankle and foot: Secondary | ICD-10-CM | POA: Diagnosis not present

## 2018-10-01 DIAGNOSIS — Z79899 Other long term (current) drug therapy: Secondary | ICD-10-CM | POA: Diagnosis not present

## 2018-10-22 ENCOUNTER — Other Ambulatory Visit: Payer: Self-pay | Admitting: Internal Medicine

## 2018-10-22 MED ORDER — OXYCODONE HCL 10 MG PO TABS: 10.0000 mg | ORAL_TABLET | Freq: Two times a day (BID) | ORAL | 0 refills | Status: DC | PRN

## 2018-10-22 NOTE — Telephone Encounter (Signed)
Name of Medication: oxycodone 10 mg Name of Pharmacy: [facility] [address] Last Fill or Written Date and Quantity:# 120 on 09/23/18  Last Office Visit and Type: 09/23/18 leg pain Next Office Visit and Type: 12/23/18 for 3 mth FU Last Controlled Substance Agreement Date:03/25/18  Last UDS:03/20/18

## 2018-10-22 NOTE — Telephone Encounter (Signed)
Patient called to get a refill on Oxycodone.  Patient uses [facility].

## 2018-10-28 DIAGNOSIS — M79671 Pain in right foot: Secondary | ICD-10-CM | POA: Diagnosis not present

## 2018-11-19 ENCOUNTER — Other Ambulatory Visit: Payer: Self-pay | Admitting: Internal Medicine

## 2018-11-19 NOTE — Telephone Encounter (Signed)
Patient called to get a refill on Oxycodone.  Patient uses [facility].

## 2018-11-19 NOTE — Telephone Encounter (Signed)
Name of Medication:oxycodone 10 mg  Name of Pharmacy: [facility] [address] Last Fill or Written Date and Quantity: # 120 on 10/22/18 Last Office Visit and Type: 09/23/18 chronic pain & leg and shoulder pain Next Office Visit and Type:12/23/18 for 3 mth FU  Last Controlled Substance Agreement Date: 03/25/18 Last UDS:03/20/18

## 2018-11-20 MED ORDER — OXYCODONE HCL 10 MG PO TABS: 10.0000 mg | ORAL_TABLET | Freq: Two times a day (BID) | ORAL | 0 refills | Status: DC | PRN

## 2018-11-28 ENCOUNTER — Ambulatory Visit (INDEPENDENT_AMBULATORY_CARE_PROVIDER_SITE_OTHER): Payer: Medicare Other

## 2018-11-28 DIAGNOSIS — Z23 Encounter for immunization: Secondary | ICD-10-CM | POA: Diagnosis not present

## 2018-12-03 ENCOUNTER — Telehealth: Payer: Self-pay | Admitting: Internal Medicine

## 2018-12-03 MED ORDER — ATORVASTATIN CALCIUM 20 MG PO TABS: ORAL_TABLET | ORAL | 3 refills | Status: DC

## 2018-12-03 NOTE — Telephone Encounter (Signed)
Rx sent electronically.  

## 2018-12-03 NOTE — Telephone Encounter (Signed)
Patient is requesting her prescription be sent to local pharmacy is [place]   Atorvastatin [place]

## 2018-12-19 ENCOUNTER — Telehealth: Payer: Self-pay | Admitting: Internal Medicine

## 2018-12-19 MED ORDER — OXYCODONE HCL 10 MG PO TABS: 10.0000 mg | ORAL_TABLET | Freq: Two times a day (BID) | ORAL | 0 refills | Status: DC | PRN

## 2018-12-19 NOTE — Telephone Encounter (Signed)
Name of Medication:oxycodone 10 mg  Name of Pharmacy: [facility] [address] Last Fill or Written Date and Quantity: # 120 on 11/20/18 Last Office Visit and Type: 09/23/18 chronic pain & leg and shoulder pain Next Office Visit and Type:12/23/18 for 3 mth FU  Last Controlled Substance Agreement Date: 03/25/18 Last UDS:03/20/18

## 2018-12-19 NOTE — Telephone Encounter (Signed)
Pt called to get a refill on oxycodone [facility] [address] [place]  Pt will be out of meds tomorrow

## 2018-12-23 ENCOUNTER — Encounter: Payer: Self-pay | Admitting: Internal Medicine

## 2018-12-23 ENCOUNTER — Other Ambulatory Visit: Payer: Self-pay

## 2018-12-23 ENCOUNTER — Ambulatory Visit (INDEPENDENT_AMBULATORY_CARE_PROVIDER_SITE_OTHER): Payer: Medicare Other | Admitting: Internal Medicine

## 2018-12-23 DIAGNOSIS — G894 Chronic pain syndrome: Secondary | ICD-10-CM | POA: Diagnosis not present

## 2018-12-23 DIAGNOSIS — N3941 Urge incontinence: Secondary | ICD-10-CM | POA: Insufficient documentation

## 2018-12-23 DIAGNOSIS — F1120 Opioid dependence, uncomplicated: Secondary | ICD-10-CM | POA: Diagnosis not present

## 2018-12-23 NOTE — Progress Notes (Signed)
Subjective:    Patient ID: Tina Pacheco, female    DOB: 13-May-1950, 68 y.o.   MRN: [id number]  HPI Here for follow up of chronic pain as well as urinary incontinence  Ongoing chronic pain Using the oxycodone same way--takes away the constant nagging pain Continues to walk with rollator walker Doesn't do much around the house--husband does most of the instrumental ADLs Foot is some better---back is bad  Ongoing urge incontinence No stress component Does do scheduled voids Does note delayed initiation on this--no other clear side effects Doesn't use it all the time--mostly if she is going to go out  Current Outpatient Medications on File Prior to Visit  Medication Sig Dispense Refill  . amLODipine (NORVASC) 5 MG tablet Take 5 mgs by mouth once daily in the evening 90 tablet 3  . atorvastatin (LIPITOR) 20 MG tablet TAKE 1 TABLET DAILY AT 6PM 90 tablet 3  . dabigatran (PRADAXA) 150 MG CAPS capsule Take 1 capsule (150 mg total) by mouth 2 (two) times daily. 180 capsule 1  . diphenhydrAMINE (BENADRYL) 25 MG tablet Take 25 mg by mouth daily as needed for allergies.    . folic acid (FOLVITE) 1 MG tablet Take 1 mg by mouth daily.    Marland Kitchen gabapentin (NEURONTIN) 300 MG capsule TAKE 1 CAPSULE BY MOUTH THREE TIMES A DAY(TAKE 1 CAPSULE 2-3 HOURS BEFORE BEDTIME) 90 capsule 5  . hydroxychloroquine (PLAQUENIL) 200 MG tablet Take 200 mg by mouth 2 (two) times daily.     Marland Kitchen leflunomide (ARAVA) 20 MG tablet Take 20 mg by mouth every morning.     . meclizine (ANTIVERT) 25 MG tablet Take 1 tablet (25 mg total) by mouth 3 (three) times daily. 90 tablet 11  . metoprolol succinate (TOPROL-XL) 50 MG 24 hr tablet TAKE 1 TABLET BY MOUTH ONCE DAILY WITH OR IMMEDIATELY FOLLOWING A MEAL 90 tablet 3  . montelukast (SINGULAIR) 10 MG tablet TAKE 1 TABLET BY MOUTH AT BEDTIME 90 tablet 3  . Multiple Vitamin (MULTIVITAMIN) tablet Take 1 tablet by mouth daily.     . Oxycodone HCl 10 MG TABS Take 1-2 tablets (10-20 mg  total) by mouth 2 (two) times daily as needed. 120 tablet 0  . polyethylene glycol (MIRALAX / GLYCOLAX) packet Take 17 g by mouth daily as needed for mild constipation. 14 each 0  . potassium chloride SA (KLOR-CON M20) 20 MEQ tablet TAKE 1/2 TABLET (=10MEQ) TWO TIMES A DAY ; MAY TAKE AND EXTRA TABLET WHEN TAKING TORSEMIDE 180 tablet 3  . SF 5000 PLUS 1.1 % CREA dental cream Place 1 application onto teeth 2 (two) times daily.  1  . sodium chloride (OCEAN) 0.65 % SOLN nasal spray Place 1 spray into both nostrils as needed for congestion.    Marland Kitchen terazosin (HYTRIN) 10 MG capsule TAKE 1 CAPSULE BY MOUTH AT BEDTIME 90 capsule 3  . tolterodine (DETROL LA) 2 MG 24 hr capsule TAKE 1 CAPSULE(2 MG) BY MOUTH DAILY AS NEEDED FOR BLADDER SPASMS 30 capsule 5  . torsemide (DEMADEX) 20 MG tablet Take 20 mg by mouth twice daily as needed for swelling 60 tablet 3   No current facility-administered medications on file prior to visit.     Allergies  Allergen Reactions  . Codeine     Nausea and vomiting    Past Medical History:  Diagnosis Date  . Arthritis    RA  . Asthma   . Basal cell carcinoma   . Cataract 2018  bilateral eyes; corrected with surgery  . Claustrophobia   . Collagen vascular disease (HCC)    RA  . COPD (chronic obstructive pulmonary disease) ([place])   . Diastolic dysfunction    a. echo 07/2014: EF 55-60%, no RWMA, GR2DD, mild MR, LA moderately dilated, PASP 38 mm Hg  . Dyslipidemia   . Dysrhythmia   . Headache    migraines  . Hemihypertrophy   . History of cardiac cath    a. 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 History of gout   . Hyperplastic colonic polyp 2003  . Hypertension   . Hypokalemia   . Morbid obesity ([place])   . PAF (paroxysmal atrial fibrillation) (HCC)    a. on Pradaxa; b. CHADSVASc at least 2 (HTN & female)  . Rheumatoid arthritis(714.0)   . Sleep apnea    a. not compliant with CPAP    Past Surgical History:  Procedure Laterality Date  . ABDOMINAL  HYSTERECTOMY    . APPLICATION OF WOUND VAC Right 08/09/2017   Procedure: APPLICATION OF WOUND VAC;  Surgeon: Albertine Patricia, DPM;  Location: [facility] OR[facility];  Service: Podiatry;  Laterality: Right;  . CARDIAC CATHETERIZATION  05/24/2010   nonobstructive CAD  . CATARACT EXTRACTION W/ INTRAOCULAR LENS IMPLANT Right 06/12/2016   Dr. Darleen Crocker  . CATARACT EXTRACTION W/ INTRAOCULAR LENS IMPLANT Left 06/26/2016   Dr. Darleen Crocker  . CESAREAN SECTION    . CHOLECYSTECTOMY    . COLONOSCOPY  06/12/2011   Procedure: COLONOSCOPY;  Surgeon: Juanita Craver, MD;  [facility];  Service: Endoscopy;  Laterality: N/A;  . COLONOSCOPY N/A 03/17/2013   Procedure: COLONOSCOPY;  Surgeon: Juanita Craver, MD;  Location: [facility];  Service: Endoscopy;  Laterality: N/A;  . EYE SURGERY    . FOOT ARTHRODESIS Right 07/13/2017   Procedure: ARTHRODESIS FOOT-MULTI.FUSIONS (6 JOINTS);  Surgeon: Albertine Patricia, DPM;  Location: [facility];  Service: Podiatry;  Laterality: Right;  . IRRIGATION AND DEBRIDEMENT FOOT Right 08/09/2017   Procedure: IRRIGATION AND DEBRIDEMENT FOOT;  Surgeon: Albertine Patricia, DPM;  Location: [facility];  Service: Podiatry;  Laterality: Right;  . KNEE ARTHROSCOPY     bilateral  . SINUSOTOMY    . TOOTH EXTRACTION  12/2016  . VAGINAL HYSTERECTOMY      Family History  Problem Relation Age of Onset  . Emphysema Father        smoked  . Tuberculosis Mother   . Parkinsonism Mother   . Diabetes type II Sister   . Breast cancer Sister   . Breast cancer Maternal Aunt   . Tuberculosis Sister     Social History   Socioeconomic History  . Marital status: Married    Spouse name: Not on file  . Number of children: 1  . Years of education: Not on file  . Highest education level: Not on file  Occupational History  . 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 Financial resource strain: Not on file  . Food insecurity    Worry: Not on file     Inability: Not on file  . Transportation needs    Medical: Not on file    Non-medical: Not on file  Tobacco Use  . Smoking status: Never Smoker  . Smokeless tobacco: Never Used  Substance and Sexual Activity  . Alcohol use: No  . Drug use: No  . Sexual activity: Not on file  Lifestyle  . Physical activity  Days per week: Not on file    Minutes per session: Not on file  . Stress: Not on file  Relationships  . Social Herbalist on phone: Not on file    Gets together: Not on file    Attends religious service: Not on file    Active member of club or organization: Not on file    Attends meetings of clubs or organizations: Not on file    Relationship status: Not on file  . Intimate partner violence    Fear of current or ex partner: Not on file    Emotionally abused: Not on file    Physically abused: Not on file    Forced sexual activity: Not on file  Other Topics Concern  . Not on file  Social History Narrative   No living will   Husband, then daughter should be decision maker   Would accept resuscitation attempts   Not sure about tube feeds   Review of Systems Has gained 20# since last visit Eating more--discussed Uses Weight Watchers--but did better with in person meetings Hasn't been able to do any exercise due to back Stable DOE--no chest pain    Objective:   Physical Exam  Constitutional: No distress.  Psychiatric: She has a normal mood and affect. Her behavior is normal.           Assessment & Plan:

## 2018-12-23 NOTE — Assessment & Plan Note (Signed)
Uses detrol prn Discussed trying 4mg  for when she uses it Reviewed behavioral means

## 2018-12-23 NOTE — Assessment & Plan Note (Signed)
PDMP reviewed No concerns 

## 2018-12-23 NOTE — Assessment & Plan Note (Signed)
Ongoing chronic back pain Foot is not as bad Continues on the narcotics Discussed fitness, weight, etc

## 2018-12-31 DIAGNOSIS — M1A00X0 Idiopathic chronic gout, unspecified site, without tophus (tophi): Secondary | ICD-10-CM | POA: Diagnosis not present

## 2018-12-31 DIAGNOSIS — M170 Bilateral primary osteoarthritis of knee: Secondary | ICD-10-CM | POA: Diagnosis not present

## 2018-12-31 DIAGNOSIS — G8929 Other chronic pain: Secondary | ICD-10-CM | POA: Diagnosis not present

## 2018-12-31 DIAGNOSIS — Z79899 Other long term (current) drug therapy: Secondary | ICD-10-CM | POA: Diagnosis not present

## 2018-12-31 DIAGNOSIS — M0579 Rheumatoid arthritis with rheumatoid factor of multiple sites without organ or systems involvement: Secondary | ICD-10-CM | POA: Diagnosis not present

## 2018-12-31 DIAGNOSIS — M25512 Pain in left shoulder: Secondary | ICD-10-CM | POA: Diagnosis not present

## 2018-12-31 DIAGNOSIS — G894 Chronic pain syndrome: Secondary | ICD-10-CM | POA: Diagnosis not present

## 2019-01-01 DIAGNOSIS — M79671 Pain in right foot: Secondary | ICD-10-CM | POA: Diagnosis not present

## 2019-01-01 DIAGNOSIS — R600 Localized edema: Secondary | ICD-10-CM | POA: Diagnosis not present

## 2019-01-01 DIAGNOSIS — M19071 Primary osteoarthritis, right ankle and foot: Secondary | ICD-10-CM | POA: Diagnosis not present

## 2019-01-01 DIAGNOSIS — G8929 Other chronic pain: Secondary | ICD-10-CM | POA: Diagnosis not present

## 2019-01-12 ENCOUNTER — Other Ambulatory Visit: Payer: Self-pay | Admitting: Internal Medicine

## 2019-01-14 NOTE — Telephone Encounter (Signed)
Looks like this was last filled by Rollene Fare, you saw pt 10/26 for chronic pain no mention of continuing Gabapentin, please advise if okay to refill under you

## 2019-01-15 ENCOUNTER — Other Ambulatory Visit: Payer: Self-pay | Admitting: Internal Medicine

## 2019-01-15 MED ORDER — OXYCODONE HCL 10 MG PO TABS: 10.0000 mg | ORAL_TABLET | Freq: Two times a day (BID) | ORAL | 0 refills | Status: DC | PRN

## 2019-01-15 MED ORDER — GABAPENTIN 300 MG PO CAPS: ORAL_CAPSULE | ORAL | 11 refills | Status: DC

## 2019-01-15 NOTE — Telephone Encounter (Signed)
Pt called to get a refill on oxycodone  Pt will be out of her meds tomorrow  [facility] [address] [place]

## 2019-01-15 NOTE — Telephone Encounter (Signed)
Pt called checking on this refill She is out of her gabapentin   Best number [phone]

## 2019-01-15 NOTE — Telephone Encounter (Signed)
Name of Medication: Oxycodone 10 mg Name of Pharmacy:[facility] [address] Last Written Date and Quantity: 12/19/18#120  Last Office Visit and Type:12/23/18 Next Office Visit and Type: 04/04/19 Last Controlled Substance Agreement Date:03/25/18 Last UDS:03/20/18  Spoke to pt. She said she is calling early to make sure the pharmacy has it and 2 weeks ago she said she had to take a few extra for 2 days due to pain. She has 5 left.

## 2019-01-16 ENCOUNTER — Other Ambulatory Visit: Payer: Self-pay | Admitting: Internal Medicine

## 2019-01-21 DIAGNOSIS — K625 Hemorrhage of anus and rectum: Secondary | ICD-10-CM | POA: Diagnosis not present

## 2019-01-21 DIAGNOSIS — K5900 Constipation, unspecified: Secondary | ICD-10-CM | POA: Diagnosis not present

## 2019-01-21 DIAGNOSIS — R21 Rash and other nonspecific skin eruption: Secondary | ICD-10-CM | POA: Diagnosis not present

## 2019-02-07 ENCOUNTER — Other Ambulatory Visit: Payer: Self-pay | Admitting: Internal Medicine

## 2019-02-07 NOTE — Telephone Encounter (Signed)
Last filled by [facility] 06/2018 with 5 refills... please advise if appropriate to refill from you

## 2019-02-13 ENCOUNTER — Other Ambulatory Visit: Payer: Self-pay | Admitting: Internal Medicine

## 2019-02-13 NOTE — Telephone Encounter (Signed)
Patient is requesting a refill for Oxycodone   She stated she has enough to make it until Saturday   [facility] [address] [place] [place]

## 2019-02-14 MED ORDER — OXYCODONE HCL 10 MG PO TABS: 10.0000 mg | ORAL_TABLET | Freq: Two times a day (BID) | ORAL | 0 refills | Status: DC | PRN

## 2019-02-14 NOTE — Telephone Encounter (Signed)
Name of Medication: oxycodone 10 mg Name of Pharmacy: [facility] [address] Last Fill or Written Date and Quantity: # 120 on 01/15/19 Last Office Visit and Type: 12/23/18 for 3 mth FU Next Office Visit and Type:04/04/2019 AWV  Last Controlled Substance Agreement Date: 03/25/18 Last UDS:03/20/18   Pt has enough med to last until 02/15/19.

## 2019-02-28 DIAGNOSIS — J449 Chronic obstructive pulmonary disease, unspecified: Secondary | ICD-10-CM | POA: Insufficient documentation

## 2019-02-28 DIAGNOSIS — I2510 Atherosclerotic heart disease of native coronary artery without angina pectoris: Secondary | ICD-10-CM | POA: Insufficient documentation

## 2019-02-28 DIAGNOSIS — G4730 Sleep apnea, unspecified: Secondary | ICD-10-CM | POA: Insufficient documentation

## 2019-02-28 DIAGNOSIS — M109 Gout, unspecified: Secondary | ICD-10-CM | POA: Insufficient documentation

## 2019-02-28 DIAGNOSIS — I480 Paroxysmal atrial fibrillation: Secondary | ICD-10-CM | POA: Insufficient documentation

## 2019-03-07 ENCOUNTER — Other Ambulatory Visit: Payer: Self-pay

## 2019-03-07 NOTE — Telephone Encounter (Signed)
Rx last refilled on 02/08/18 for #90 with 11 refills. Patient has upcoming CPE on 04/04/19. Ok to refill?

## 2019-03-09 MED ORDER — MECLIZINE HCL 25 MG PO TABS: 25.0000 mg | ORAL_TABLET | Freq: Three times a day (TID) | ORAL | 5 refills | Status: DC | PRN

## 2019-03-10 DIAGNOSIS — B372 Candidiasis of skin and nail: Secondary | ICD-10-CM | POA: Diagnosis not present

## 2019-03-10 DIAGNOSIS — R21 Rash and other nonspecific skin eruption: Secondary | ICD-10-CM | POA: Diagnosis not present

## 2019-03-14 ENCOUNTER — Other Ambulatory Visit: Payer: Self-pay | Admitting: Internal Medicine

## 2019-03-14 NOTE — Telephone Encounter (Signed)
Pt called needing to get a refill on   Oxycodone [facility] [address]  Pt is due for refill on monday

## 2019-03-14 NOTE — Telephone Encounter (Signed)
Name of Medication: oxycodone 10 mg Name of Pharmacy: [facility] [address] Last Fill or Written Date and Quantity: # 120 on 02/14/2019 Last Office Visit and Type: 12/23/18 for 3 mth FU Next Office Visit and Type:04/04/2019 AWV  Last Controlled Substance Agreement Date: 03/25/18 Last UDS:03/20/18

## 2019-03-16 MED ORDER — OXYCODONE HCL 10 MG PO TABS: 10.0000 mg | ORAL_TABLET | Freq: Two times a day (BID) | ORAL | 0 refills | Status: DC | PRN

## 2019-03-25 DIAGNOSIS — L97129 Non-pressure chronic ulcer of left thigh with unspecified severity: Secondary | ICD-10-CM | POA: Diagnosis not present

## 2019-03-25 DIAGNOSIS — B372 Candidiasis of skin and nail: Secondary | ICD-10-CM | POA: Diagnosis not present

## 2019-03-30 ENCOUNTER — Inpatient Hospital Stay
Admission: EM | Admit: 2019-03-30 | Discharge: 2019-04-04 | DRG: 292 | Disposition: A | Discharge status: Skilled Nursing Facility | Payer: Medicare Other | Attending: Internal Medicine | Admitting: Internal Medicine

## 2019-03-30 ENCOUNTER — Encounter: Payer: Self-pay | Admitting: Family Medicine

## 2019-03-30 ENCOUNTER — Other Ambulatory Visit: Payer: Self-pay

## 2019-03-30 ENCOUNTER — Emergency Department: Payer: Medicare Other

## 2019-03-30 DIAGNOSIS — R5381 Other malaise: Secondary | ICD-10-CM | POA: Diagnosis not present

## 2019-03-30 DIAGNOSIS — G4730 Sleep apnea, unspecified: Secondary | ICD-10-CM | POA: Diagnosis present

## 2019-03-30 DIAGNOSIS — Z9842 Cataract extraction status, left eye: Secondary | ICD-10-CM

## 2019-03-30 DIAGNOSIS — M069 Rheumatoid arthritis, unspecified: Secondary | ICD-10-CM | POA: Diagnosis present

## 2019-03-30 DIAGNOSIS — Z9841 Cataract extraction status, right eye: Secondary | ICD-10-CM

## 2019-03-30 DIAGNOSIS — I5033 Acute on chronic diastolic (congestive) heart failure: Secondary | ICD-10-CM | POA: Diagnosis present

## 2019-03-30 DIAGNOSIS — W1811XA Fall from or off toilet without subsequent striking against object, initial encounter: Secondary | ICD-10-CM | POA: Diagnosis present

## 2019-03-30 DIAGNOSIS — Z86010 Personal history of colonic polyps: Secondary | ICD-10-CM

## 2019-03-30 DIAGNOSIS — M170 Bilateral primary osteoarthritis of knee: Secondary | ICD-10-CM | POA: Diagnosis present

## 2019-03-30 DIAGNOSIS — M0579 Rheumatoid arthritis with rheumatoid factor of multiple sites without organ or systems involvement: Secondary | ICD-10-CM | POA: Diagnosis not present

## 2019-03-30 DIAGNOSIS — G629 Polyneuropathy, unspecified: Secondary | ICD-10-CM | POA: Diagnosis present

## 2019-03-30 DIAGNOSIS — M109 Gout, unspecified: Secondary | ICD-10-CM | POA: Diagnosis present

## 2019-03-30 DIAGNOSIS — R296 Repeated falls: Secondary | ICD-10-CM | POA: Diagnosis present

## 2019-03-30 DIAGNOSIS — I25119 Atherosclerotic heart disease of native coronary artery with unspecified angina pectoris: Secondary | ICD-10-CM | POA: Diagnosis present

## 2019-03-30 DIAGNOSIS — Z9181 History of falling: Secondary | ICD-10-CM | POA: Diagnosis not present

## 2019-03-30 DIAGNOSIS — Z79899 Other long term (current) drug therapy: Secondary | ICD-10-CM

## 2019-03-30 DIAGNOSIS — I10 Essential (primary) hypertension: Secondary | ICD-10-CM | POA: Diagnosis not present

## 2019-03-30 DIAGNOSIS — Z6841 Body Mass Index (BMI) 40.0 and over, adult: Secondary | ICD-10-CM | POA: Diagnosis not present

## 2019-03-30 DIAGNOSIS — E7849 Other hyperlipidemia: Secondary | ICD-10-CM | POA: Diagnosis not present

## 2019-03-30 DIAGNOSIS — R748 Abnormal levels of other serum enzymes: Secondary | ICD-10-CM | POA: Diagnosis present

## 2019-03-30 DIAGNOSIS — I2510 Atherosclerotic heart disease of native coronary artery without angina pectoris: Secondary | ICD-10-CM | POA: Diagnosis present

## 2019-03-30 DIAGNOSIS — J449 Chronic obstructive pulmonary disease, unspecified: Secondary | ICD-10-CM | POA: Diagnosis not present

## 2019-03-30 DIAGNOSIS — Z209 Contact with and (suspected) exposure to unspecified communicable disease: Secondary | ICD-10-CM | POA: Diagnosis not present

## 2019-03-30 DIAGNOSIS — Z825 Family history of asthma and other chronic lower respiratory diseases: Secondary | ICD-10-CM

## 2019-03-30 DIAGNOSIS — I480 Paroxysmal atrial fibrillation: Secondary | ICD-10-CM | POA: Diagnosis present

## 2019-03-30 DIAGNOSIS — L8990 Pressure ulcer of unspecified site, unspecified stage: Secondary | ICD-10-CM | POA: Insufficient documentation

## 2019-03-30 DIAGNOSIS — M25561 Pain in right knee: Secondary | ICD-10-CM | POA: Diagnosis not present

## 2019-03-30 DIAGNOSIS — I110 Hypertensive heart disease with heart failure: Principal | ICD-10-CM | POA: Diagnosis present

## 2019-03-30 DIAGNOSIS — M19071 Primary osteoarthritis, right ankle and foot: Secondary | ICD-10-CM | POA: Diagnosis present

## 2019-03-30 DIAGNOSIS — G2581 Restless legs syndrome: Secondary | ICD-10-CM | POA: Diagnosis present

## 2019-03-30 DIAGNOSIS — I5032 Chronic diastolic (congestive) heart failure: Secondary | ICD-10-CM

## 2019-03-30 DIAGNOSIS — M19072 Primary osteoarthritis, left ankle and foot: Secondary | ICD-10-CM | POA: Diagnosis present

## 2019-03-30 DIAGNOSIS — Z20822 Contact with and (suspected) exposure to covid-19: Secondary | ICD-10-CM | POA: Diagnosis present

## 2019-03-30 DIAGNOSIS — Y92002 Bathroom of unspecified non-institutional (private) residence single-family (private) house as the place of occurrence of the external cause: Secondary | ICD-10-CM | POA: Diagnosis not present

## 2019-03-30 DIAGNOSIS — L89302 Pressure ulcer of unspecified buttock, stage 2: Secondary | ICD-10-CM | POA: Diagnosis present

## 2019-03-30 DIAGNOSIS — I509 Heart failure, unspecified: Secondary | ICD-10-CM | POA: Diagnosis not present

## 2019-03-30 DIAGNOSIS — W19XXXA Unspecified fall, initial encounter: Secondary | ICD-10-CM | POA: Diagnosis present

## 2019-03-30 DIAGNOSIS — E785 Hyperlipidemia, unspecified: Secondary | ICD-10-CM | POA: Diagnosis present

## 2019-03-30 DIAGNOSIS — M25562 Pain in left knee: Secondary | ICD-10-CM | POA: Diagnosis not present

## 2019-03-30 DIAGNOSIS — I5023 Acute on chronic systolic (congestive) heart failure: Secondary | ICD-10-CM | POA: Diagnosis not present

## 2019-03-30 DIAGNOSIS — M6281 Muscle weakness (generalized): Secondary | ICD-10-CM | POA: Diagnosis not present

## 2019-03-30 DIAGNOSIS — R531 Weakness: Secondary | ICD-10-CM

## 2019-03-30 DIAGNOSIS — R52 Pain, unspecified: Secondary | ICD-10-CM | POA: Diagnosis not present

## 2019-03-30 DIAGNOSIS — F40240 Claustrophobia: Secondary | ICD-10-CM | POA: Diagnosis present

## 2019-03-30 DIAGNOSIS — Z7401 Bed confinement status: Secondary | ICD-10-CM | POA: Diagnosis not present

## 2019-03-30 DIAGNOSIS — M549 Dorsalgia, unspecified: Secondary | ICD-10-CM | POA: Diagnosis present

## 2019-03-30 DIAGNOSIS — J45998 Other asthma: Secondary | ICD-10-CM | POA: Diagnosis not present

## 2019-03-30 DIAGNOSIS — G894 Chronic pain syndrome: Secondary | ICD-10-CM | POA: Diagnosis present

## 2019-03-30 DIAGNOSIS — Z79891 Long term (current) use of opiate analgesic: Secondary | ICD-10-CM

## 2019-03-30 DIAGNOSIS — Z885 Allergy status to narcotic agent status: Secondary | ICD-10-CM

## 2019-03-30 DIAGNOSIS — R29898 Other symptoms and signs involving the musculoskeletal system: Secondary | ICD-10-CM | POA: Diagnosis not present

## 2019-03-30 DIAGNOSIS — Z961 Presence of intraocular lens: Secondary | ICD-10-CM | POA: Diagnosis present

## 2019-03-30 DIAGNOSIS — Z85828 Personal history of other malignant neoplasm of skin: Secondary | ICD-10-CM

## 2019-03-30 DIAGNOSIS — R2681 Unsteadiness on feet: Secondary | ICD-10-CM | POA: Diagnosis not present

## 2019-03-30 DIAGNOSIS — Z981 Arthrodesis status: Secondary | ICD-10-CM

## 2019-03-30 DIAGNOSIS — R0602 Shortness of breath: Secondary | ICD-10-CM | POA: Diagnosis not present

## 2019-03-30 DIAGNOSIS — M2550 Pain in unspecified joint: Secondary | ICD-10-CM | POA: Diagnosis not present

## 2019-03-30 DIAGNOSIS — I5031 Acute diastolic (congestive) heart failure: Secondary | ICD-10-CM | POA: Diagnosis not present

## 2019-03-30 LAB — CBC WITH DIFFERENTIAL/PLATELET
Abs Immature Granulocytes: 0.05 10*3/uL (ref 0.00–0.07)
Basophils Absolute: 0.1 10*3/uL (ref 0.0–0.1)
Basophils Relative: 1 %
Eosinophils Absolute: 0 10*3/uL (ref 0.0–0.5)
Eosinophils Relative: 0 %
HCT: 37 % (ref 36.0–46.0)
Hemoglobin: 11.9 g/dL — ABNORMAL LOW (ref 12.0–15.0)
Immature Granulocytes: 1 %
Lymphocytes Relative: 11 %
Lymphs Abs: 1.1 10*3/uL (ref 0.7–4.0)
MCH: 27.5 pg (ref 26.0–34.0)
MCHC: 32.2 g/dL (ref 30.0–36.0)
MCV: 85.6 fL (ref 80.0–100.0)
Monocytes Absolute: 1 10*3/uL (ref 0.1–1.0)
Monocytes Relative: 11 %
Neutro Abs: 7.4 10*3/uL (ref 1.7–7.7)
Neutrophils Relative %: 76 %
Platelets: 142 10*3/uL — ABNORMAL LOW (ref 150–400)
RBC: 4.32 MIL/uL (ref 3.87–5.11)
RDW: 15.2 % (ref 11.5–15.5)
WBC: 9.7 10*3/uL (ref 4.0–10.5)
nRBC: 0 % (ref 0.0–0.2)

## 2019-03-30 LAB — URINALYSIS, COMPLETE (UACMP) WITH MICROSCOPIC
Bilirubin Urine: NEGATIVE
Glucose, UA: NEGATIVE mg/dL
Ketones, ur: 5 mg/dL — AB
Leukocytes,Ua: NEGATIVE
Nitrite: NEGATIVE
Protein, ur: 100 mg/dL — AB
Specific Gravity, Urine: 1.017 (ref 1.005–1.030)
pH: 5 (ref 5.0–8.0)

## 2019-03-30 LAB — TROPONIN I (HIGH SENSITIVITY)
Troponin I (High Sensitivity): 13 ng/L (ref <18)
Troponin I (High Sensitivity): 14 ng/L (ref <18)

## 2019-03-30 LAB — COMPREHENSIVE METABOLIC PANEL
ALT: 21 U/L (ref 0–44)
AST: 42 U/L — ABNORMAL HIGH (ref 15–41)
Albumin: 3.2 g/dL — ABNORMAL LOW (ref 3.5–5.0)
Alkaline Phosphatase: 84 U/L (ref 38–126)
Anion gap: 11 (ref 5–15)
BUN: 15 mg/dL (ref 8–23)
CO2: 23 mmol/L (ref 22–32)
Calcium: 9.4 mg/dL (ref 8.9–10.3)
Chloride: 98 mmol/L (ref 98–111)
Creatinine, Ser: 0.77 mg/dL (ref 0.44–1.00)
GFR calc Af Amer: >60 mL/min (ref >60)
GFR calc non Af Amer: >60 mL/min (ref >60)
Glucose, Bld: 114 mg/dL — ABNORMAL HIGH (ref 70–99)
Potassium: 3.5 mmol/L (ref 3.5–5.1)
Sodium: 132 mmol/L — ABNORMAL LOW (ref 135–145)
Total Bilirubin: 2.5 mg/dL — ABNORMAL HIGH (ref 0.3–1.2)
Total Protein: 6.9 g/dL (ref 6.5–8.1)

## 2019-03-30 LAB — C-REACTIVE PROTEIN: CRP: 20.8 mg/dL — ABNORMAL HIGH (ref <1.0)

## 2019-03-30 LAB — LACTIC ACID, PLASMA
Lactic Acid, Venous: 1.4 mmol/L (ref 0.5–1.9)
Lactic Acid, Venous: 1 mmol/L (ref 0.5–1.9)

## 2019-03-30 LAB — SEDIMENTATION RATE: Sed Rate: 56 mm/hr — ABNORMAL HIGH (ref 0–30)

## 2019-03-30 LAB — POC SARS CORONAVIRUS 2 AG: SARS Coronavirus 2 Ag: NEGATIVE

## 2019-03-30 LAB — CK: Total CK: 307 U/L — ABNORMAL HIGH (ref 38–234)

## 2019-03-30 LAB — BRAIN NATRIURETIC PEPTIDE: B Natriuretic Peptide: 285 pg/mL — ABNORMAL HIGH (ref 0.0–100.0)

## 2019-03-30 LAB — SARS CORONAVIRUS 2 (TAT 6-24 HRS): SARS Coronavirus 2: NEGATIVE

## 2019-03-30 MED ORDER — HYDRALAZINE HCL 25 MG PO TABS: 25.0000 mg | ORAL_TABLET | Freq: Three times a day (TID) | ORAL | Status: DC | PRN

## 2019-03-30 MED ORDER — FUROSEMIDE 10 MG/ML IJ SOLN
40.0000 mg | Freq: Every day | INTRAMUSCULAR | Status: DC
  Administered 2019-03-31: 40 mg via INTRAVENOUS
  Filled 2019-03-30: qty 4

## 2019-03-30 MED ORDER — OXYCODONE HCL 5 MG PO TABS
10.0000 mg | ORAL_TABLET | Freq: Four times a day (QID) | ORAL | Status: DC | PRN
  Administered 2019-03-30 – 2019-04-04 (×12): 10 mg via ORAL
  Filled 2019-03-30 (×12): qty 2

## 2019-03-30 MED ORDER — MECLIZINE HCL 25 MG PO TABS
25.0000 mg | ORAL_TABLET | Freq: Three times a day (TID) | ORAL | Status: DC | PRN
  Administered 2019-04-02 – 2019-04-04 (×2): 25 mg via ORAL
  Filled 2019-03-30 (×5): qty 1

## 2019-03-30 MED ORDER — SODIUM CHLORIDE 0.9% FLUSH
3.0000 mL | Freq: Two times a day (BID) | INTRAVENOUS | Status: DC
  Administered 2019-03-30 – 2019-04-04 (×9): 3 mL via INTRAVENOUS

## 2019-03-30 MED ORDER — BUMETANIDE 0.25 MG/ML IJ SOLN
1.0000 mg | Freq: Once | INTRAMUSCULAR | Status: AC
  Administered 2019-03-30: 1 mg via INTRAVENOUS
  Filled 2019-03-30: qty 4

## 2019-03-30 MED ORDER — IPRATROPIUM BROMIDE HFA 17 MCG/ACT IN AERS: 2.0000 | INHALATION_SPRAY | Freq: Four times a day (QID) | RESPIRATORY_TRACT | Status: DC

## 2019-03-30 MED ORDER — FOLIC ACID 1 MG PO TABS
1.0000 mg | ORAL_TABLET | Freq: Every day | ORAL | Status: DC
  Administered 2019-03-30 – 2019-04-04 (×6): 1 mg via ORAL
  Filled 2019-03-30 (×7): qty 1

## 2019-03-30 MED ORDER — PREDNISONE 20 MG PO TABS
20.0000 mg | ORAL_TABLET | Freq: Every day | ORAL | Status: DC
  Administered 2019-03-30 – 2019-04-04 (×6): 20 mg via ORAL
  Filled 2019-03-30 (×7): qty 1

## 2019-03-30 MED ORDER — ACETAMINOPHEN 325 MG PO TABS
650.0000 mg | ORAL_TABLET | Freq: Four times a day (QID) | ORAL | Status: DC | PRN
  Administered 2019-03-31: 650 mg via ORAL
  Filled 2019-03-30: qty 2

## 2019-03-30 MED ORDER — METOPROLOL SUCCINATE ER 50 MG PO TB24
50.0000 mg | ORAL_TABLET | Freq: Every day | ORAL | Status: DC
  Administered 2019-03-30 – 2019-04-04 (×5): 50 mg via ORAL
  Filled 2019-03-30 (×5): qty 1

## 2019-03-30 MED ORDER — SODIUM CHLORIDE 0.9 % IV SOLN: 250.0000 mL | INTRAVENOUS | Status: DC | PRN

## 2019-03-30 MED ORDER — DIPHENHYDRAMINE HCL 25 MG PO CAPS
25.0000 mg | ORAL_CAPSULE | Freq: Every day | ORAL | Status: DC | PRN
  Filled 2019-03-30: qty 1

## 2019-03-30 MED ORDER — LEFLUNOMIDE 20 MG PO TABS
20.0000 mg | ORAL_TABLET | ORAL | Status: DC
  Administered 2019-03-30 – 2019-04-04 (×6): 20 mg via ORAL
  Filled 2019-03-30 (×6): qty 1

## 2019-03-30 MED ORDER — MONTELUKAST SODIUM 10 MG PO TABS
10.0000 mg | ORAL_TABLET | Freq: Every day | ORAL | Status: DC
  Administered 2019-03-30 – 2019-04-04 (×5): 10 mg via ORAL
  Filled 2019-03-30 (×5): qty 1

## 2019-03-30 MED ORDER — FEBUXOSTAT 40 MG PO TABS
40.0000 mg | ORAL_TABLET | Freq: Every day | ORAL | Status: DC
  Administered 2019-03-30 – 2019-04-04 (×5): 40 mg via ORAL
  Filled 2019-03-30 (×7): qty 1

## 2019-03-30 MED ORDER — MORPHINE SULFATE (PF) 4 MG/ML IV SOLN
4.0000 mg | Freq: Once | INTRAVENOUS | Status: AC
  Administered 2019-03-30: 4 mg via INTRAVENOUS
  Filled 2019-03-30: qty 1

## 2019-03-30 MED ORDER — SODIUM CHLORIDE 0.9% FLUSH: 3.0000 mL | INTRAVENOUS | Status: DC | PRN

## 2019-03-30 MED ORDER — BUMETANIDE 0.25 MG/ML IJ SOLN: 1.0000 mg | Freq: Once | INTRAMUSCULAR | Status: DC

## 2019-03-30 MED ORDER — DM-GUAIFENESIN ER 30-600 MG PO TB12
1.0000 | ORAL_TABLET | Freq: Two times a day (BID) | ORAL | Status: DC
  Administered 2019-03-30 – 2019-04-04 (×7): 1 via ORAL
  Filled 2019-03-30 (×6): qty 1

## 2019-03-30 MED ORDER — DABIGATRAN ETEXILATE MESYLATE 150 MG PO CAPS
150.0000 mg | ORAL_CAPSULE | Freq: Two times a day (BID) | ORAL | Status: DC
  Administered 2019-03-30 – 2019-04-04 (×11): 150 mg via ORAL
  Filled 2019-03-30 (×12): qty 1

## 2019-03-30 MED ORDER — ADULT MULTIVITAMIN W/MINERALS CH
1.0000 | ORAL_TABLET | Freq: Every day | ORAL | Status: DC
  Administered 2019-03-30 – 2019-04-04 (×6): 1 via ORAL
  Filled 2019-03-30 (×6): qty 1

## 2019-03-30 MED ORDER — TERAZOSIN HCL 5 MG PO CAPS
10.0000 mg | ORAL_CAPSULE | Freq: Every day | ORAL | Status: DC
  Administered 2019-03-30 – 2019-04-04 (×5): 10 mg via ORAL
  Filled 2019-03-30 (×6): qty 2

## 2019-03-30 MED ORDER — ONDANSETRON HCL 4 MG/2ML IJ SOLN
4.0000 mg | Freq: Three times a day (TID) | INTRAMUSCULAR | Status: DC | PRN
  Administered 2019-04-01: 4 mg via INTRAVENOUS
  Filled 2019-03-30: qty 2

## 2019-03-30 MED ORDER — AMLODIPINE BESYLATE 5 MG PO TABS
5.0000 mg | ORAL_TABLET | Freq: Every evening | ORAL | Status: DC
  Administered 2019-03-30 – 2019-04-03 (×5): 5 mg via ORAL
  Filled 2019-03-30 (×5): qty 1

## 2019-03-30 MED ORDER — LISINOPRIL 5 MG PO TABS
2.5000 mg | ORAL_TABLET | Freq: Every day | ORAL | Status: DC
  Administered 2019-03-30 – 2019-04-01 (×3): 2.5 mg via ORAL
  Filled 2019-03-30 (×4): qty 1

## 2019-03-30 MED ORDER — CHLORHEXIDINE GLUCONATE CLOTH 2 % EX PADS
6.0000 | MEDICATED_PAD | Freq: Every day | CUTANEOUS | Status: DC
  Administered 2019-03-30: 6 via TOPICAL

## 2019-03-30 MED ORDER — MORPHINE SULFATE (PF) 2 MG/ML IV SOLN
1.0000 mg | INTRAVENOUS | Status: DC | PRN
  Administered 2019-03-30 – 2019-04-01 (×4): 1 mg via INTRAVENOUS
  Filled 2019-03-30 (×4): qty 1

## 2019-03-30 MED ORDER — ALBUTEROL SULFATE HFA 108 (90 BASE) MCG/ACT IN AERS: 2.0000 | INHALATION_SPRAY | RESPIRATORY_TRACT | Status: DC | PRN

## 2019-03-30 MED ORDER — SODIUM FLUORIDE 1.1 % DT CREA: 1.0000 "application " | TOPICAL_CREAM | Freq: Two times a day (BID) | DENTAL | Status: DC

## 2019-03-30 MED ORDER — HYDROXYCHLOROQUINE SULFATE 200 MG PO TABS
200.0000 mg | ORAL_TABLET | Freq: Two times a day (BID) | ORAL | Status: DC
  Administered 2019-03-30 – 2019-04-04 (×11): 200 mg via ORAL
  Filled 2019-03-30 (×12): qty 1

## 2019-03-30 MED ORDER — ATORVASTATIN CALCIUM 20 MG PO TABS
20.0000 mg | ORAL_TABLET | Freq: Every day | ORAL | Status: DC
  Administered 2019-03-30 – 2019-03-31 (×2): 20 mg via ORAL
  Filled 2019-03-30 (×2): qty 1

## 2019-03-30 MED ORDER — ONDANSETRON HCL 4 MG/2ML IJ SOLN
4.0000 mg | Freq: Once | INTRAMUSCULAR | Status: AC
  Administered 2019-03-30: 4 mg via INTRAVENOUS
  Filled 2019-03-30: qty 2

## 2019-03-30 MED ORDER — SALINE SPRAY 0.65 % NA SOLN
1.0000 | NASAL | Status: DC | PRN
  Filled 2019-03-30: qty 44

## 2019-03-30 MED ORDER — FESOTERODINE FUMARATE ER 4 MG PO TB24
4.0000 mg | ORAL_TABLET | Freq: Every day | ORAL | Status: DC
  Administered 2019-03-30 – 2019-04-04 (×6): 4 mg via ORAL
  Filled 2019-03-30 (×6): qty 1

## 2019-03-30 MED ORDER — POLYETHYLENE GLYCOL 3350 17 G PO PACK
17.0000 g | PACK | Freq: Every day | ORAL | Status: DC | PRN
  Administered 2019-04-01 – 2019-04-03 (×3): 17 g via ORAL
  Filled 2019-03-30 (×3): qty 1

## 2019-03-30 MED ORDER — GABAPENTIN 300 MG PO CAPS
300.0000 mg | ORAL_CAPSULE | Freq: Three times a day (TID) | ORAL | Status: DC
  Administered 2019-03-30 – 2019-04-04 (×15): 300 mg via ORAL
  Filled 2019-03-30 (×15): qty 1

## 2019-03-30 NOTE — ED Triage Notes (Signed)
Pt comes from home via [facility] Pt reports increased weakness for the past week, reports tonight went to bathroom unable to stand reports slid off to the ground did not fall, denies any injuries. Pt reports history of RA, and Osteoarthritis. Pt talks in complete sentence no distress noted

## 2019-03-30 NOTE — Progress Notes (Signed)
Physical Therapy Evaluation Patient Details Name: Tina Pacheco MRN: [id number] DOB: 1950-03-07 Today's Date: 03/30/2019   History of Present Illness  er MD note:Tina Pacheco is a 69 y.o. female with medical history significant of hypertension, hyperlipidemia, COPD, asthma, gout, rheumatoid arthritis, peripheral Pradaxa, dCHF, CAD, who presents with generalized weakness, diffused joint pain, fall, shortness breath  Clinical Impression  Patient is morbidly obese with BLE weakness and pain in her joints that limits her ability to perform bed mobility, transfers and ambulation. She has 2/5 strength BLE and BLE ankle swelling. She was able to perform supine <> sit with max assit and extra time. She is not able to stand due to fatigue and pain in most of  her joints. She has good sitting balance. She will benefit from skilled PT to improve mobility and strength.   Follow Up Recommendations SNF    Equipment Recommendations  None recommended by PT    Recommendations for Other Services       Precautions / Restrictions Precautions Precautions: Fall Restrictions Weight Bearing Restrictions: No      Mobility  Bed Mobility Overal bed mobility: Needs Assistance Bed Mobility: Supine to Sit;Sit to Supine     Supine to sit: Max assist Sit to supine: Max assist   General bed mobility comments: (need to use the hospital bed, extra time due/ morbid obesity)  Transfers Overall transfer level: (unable)                  Ambulation/Gait Ambulation/Gait assistance: (unable)              Stairs            Wheelchair Mobility    Modified Rankin (Stroke Patients Only)       Balance                                             Pertinent Vitals/Pain Pain Assessment: (reports pain in most joints)    Home Living Family/patient expects to be discharged to:: Private residence Living Arrangements: Spouse/significant other Available Help at  Discharge: Family Type of Home: House Home Access: Level entry     Home Layout: Two level        Prior Function Level of Independence: Independent with assistive device(s)               Hand Dominance        Extremity/Trunk Assessment   Upper Extremity Assessment Upper Extremity Assessment: Overall WFL for tasks assessed    Lower Extremity Assessment Lower Extremity Assessment: Generalized weakness(2/5 BLE hips and knees)       Communication   Communication: No difficulties  Cognition Arousal/Alertness: Awake/alert Behavior During Therapy: WFL for tasks assessed/performed Overall Cognitive Status: Within Functional Limits for tasks assessed                                        General Comments      Exercises     Assessment/Plan    PT Assessment Patient needs continued PT services  PT Problem List         PT Treatment Interventions Gait training;Therapeutic activities;Therapeutic exercise    PT Goals (Current goals can be found in the Care Plan section)  Acute Rehab PT Goals Patient Stated  Goal: to move better PT Goal Formulation: Patient unable to participate in goal setting Time For Goal Achievement: 04/13/19 Potential to Achieve Goals: Fair    Frequency Min 2X/week   Barriers to discharge        Co-evaluation               AM-PAC PT "6 Clicks" Mobility  Outcome Measure Help needed turning from your back to your side while in a flat bed without using bedrails?: A Lot Help needed moving from lying on your back to sitting on the side of a flat bed without using bedrails?: A Lot Help needed moving to and from a bed to a chair (including a wheelchair)?: Total Help needed standing up from a chair using your arms (e.g., wheelchair or bedside chair)?: Total Help needed to walk in hospital room?: Total Help needed climbing 3-5 steps with a railing? : Total 6 Click Score: 8    End of Session   Activity Tolerance:  Patient limited by fatigue;Patient limited by pain Patient left: in bed;with bed alarm set Nurse Communication: Mobility status PT Visit Diagnosis: Muscle weakness (generalized) (M62.81);Difficulty in walking, not elsewhere classified (R26.2);Pain;Other (comment)(morbid obesity)    Time: [id number] PT Time Calculation (min) (ACUTE ONLY): 33 min   Charges:   PT Evaluation $PT Eval Low Complexity: 1 Low PT Treatments $Therapeutic Activity: 23-37 mins          [place], Sherryl Barters, PT DPT 03/30/2019, 3:20 PM

## 2019-03-30 NOTE — Progress Notes (Signed)
Pt admitted to 2A, 8/10 pain, MD notified. Pt expressed need for extra pillows and adjusting in bed.

## 2019-03-30 NOTE — H&P (Addendum)
History and Physical    ALISSIA LORY Pacheco:[id number] DOB: [birth year] DOA: 03/30/2019  Referring MD/NP/PA:   PCP: Venia Carbon, MD   Patient coming from:  The patient is coming from home.  At baseline, pt is independent for most of ADL.        Chief Complaint: Generalized weakness, joint pain, fall, shortness breath  HPI: Tina Pacheco is a 69 y.o. female with medical history significant of hypertension, hyperlipidemia, COPD, asthma, gout, rheumatoid arthritis, peripheral Pradaxa, dCHF, CAD, who presents with generalized weakness, diffused joint pain, fall, shortness breath.  Patient states that she she has been feeling weak recently, which has been progressively worsening. She fell at least 3 times, without any injury.  She states that she just slided onto the floor.  Strongly denies any head or neck injury.  Refused CT scan for head and neck.  She does not have unilateral numbness or tingling symptoms this.  No facial droop or slurred speech.  She has history of rheumatoid arthritis. She states that her joint pain has been worsening recently. She has worsening pain in multiple joints, including both hands and both knees, both ankles.  She also reports mild shortness of breath, mild dry cough, no chest pain, fever or chills.  Denies nausea, vomiting, diarrhea, abdominal pain, symptoms of UTI.  ED Course: pt was found to have WBC 9.7, BNP 285, troponin 13 -->14, lactic acid 1.4, 1.0, CK 307, ESR 56, CRP 20.8, renal function okay, negative urinalysis, negative Covid Ag test, pending Covid PCR, temperature normal, blood pressure 164/73, 127/81, heart rate 73, oxygen saturation 91 to 94% on room air.  Chest x-ray showed cardiomegaly and mild pulmonary edema.  Patient is admitted to telemetry bed as inpatient.  Review of Systems:   General: no fevers, chills, no body weight gain, has poor appetite, has fatigue HEENT: no blurry vision, hearing changes or sore throat Respiratory: has dyspnea,  coughing, no wheezing CV: no chest pain, no palpitations GI: no nausea, vomiting, abdominal pain, diarrhea, constipation GU: no dysuria, burning on urination, increased urinary frequency, hematuria  Ext: has trace leg edema Neuro: no unilateral weakness, numbness, or tingling, no vision change or hearing loss. Has fall. Skin: no rash, no skin tear. MSK: has diffuse joint pain Heme: No easy bruising.  Travel history: No recent long distant travel.  Allergy:  Allergies  Allergen Reactions  . Codeine     Nausea and vomiting    Past Medical History:  Diagnosis Date  . Acute CHF (congestive heart failure) ([place]) 03/30/2019  . Arthritis    RA  . Asthma   . Basal cell carcinoma   . Cataract 2018   bilateral eyes; corrected with surgery  . Claustrophobia   . Collagen vascular disease (HCC)    RA  . COPD (chronic obstructive pulmonary disease) ([place])   . Diastolic dysfunction    a. echo 07/2014: EF 55-60%, no RWMA, GR2DD, mild MR, LA moderately dilated, PASP 38 mm Hg  . Dyslipidemia   . Dysrhythmia   . Headache    migraines  . Hemihypertrophy   . History of cardiac cath    a. 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 History of gout   . Hyperplastic colonic polyp 2003  . Hypertension   . Hypokalemia   . Morbid obesity ([place])   . PAF (paroxysmal atrial fibrillation) (HCC)    a. on Pradaxa; b. CHADSVASc at least 2 (HTN & female)  . Rheumatoid arthritis(714.0)   .  Sleep apnea    a. not compliant with CPAP    Past Surgical History:  Procedure Laterality Date  . ABDOMINAL HYSTERECTOMY    . APPLICATION OF WOUND VAC Right 08/09/2017   Procedure: APPLICATION OF WOUND VAC;  Surgeon: Albertine Patricia, DPM;  Location: [facility];  Service: Podiatry;  Laterality: Right;  . CARDIAC CATHETERIZATION  05/24/2010   nonobstructive CAD  . CATARACT EXTRACTION W/ INTRAOCULAR LENS IMPLANT Right 06/12/2016   Dr. Darleen Crocker  . CATARACT EXTRACTION W/ INTRAOCULAR LENS IMPLANT Left 06/26/2016    Dr. Darleen Crocker  . CESAREAN SECTION    . CHOLECYSTECTOMY    . COLONOSCOPY  06/12/2011   Procedure: COLONOSCOPY;  Surgeon: Juanita Craver, MD;  Location: [facility];  Service: Endoscopy;  Laterality: N/A;  . COLONOSCOPY N/A 03/17/2013   Procedure: COLONOSCOPY;  Surgeon: Juanita Craver, MD;  Location: [facility];  Service: Endoscopy;  Laterality: N/A;  . EYE SURGERY    . FOOT ARTHRODESIS Right 07/13/2017   Procedure: ARTHRODESIS FOOT-MULTI.FUSIONS (6 JOINTS);  Surgeon: Albertine Patricia, DPM;  Location: [facility];  Service: Podiatry;  Laterality: Right;  . IRRIGATION AND DEBRIDEMENT FOOT Right 08/09/2017   Procedure: IRRIGATION AND DEBRIDEMENT FOOT;  Surgeon: Albertine Patricia, DPM;  Location: [facility];  Service: Podiatry;  Laterality: Right;  . KNEE ARTHROSCOPY     bilateral  . SINUSOTOMY    . TOOTH EXTRACTION  12/2016  . VAGINAL HYSTERECTOMY      Social History:  reports that she has never smoked. She has never used smokeless tobacco. She reports that she does not drink alcohol or use drugs.  Family History:  Family History  Problem Relation Age of Onset  . Emphysema Father        smoked  . Tuberculosis Mother   . Parkinsonism Mother   . Diabetes type II Sister   . Breast cancer Sister   . Breast cancer Maternal Aunt   . Tuberculosis Sister      Prior to Admission medications   Medication Sig Start Date End Date Taking? Authorizing Provider  amLODipine (NORVASC) 5 MG tablet Take 5 mgs by mouth once daily in the evening 08/26/18  Yes Viviana Simpler I, MD  atorvastatin (LIPITOR) 20 MG tablet TAKE 1 TABLET DAILY AT 6PM 12/03/18  Yes Venia Carbon, MD  dabigatran (PRADAXA) 150 MG CAPS capsule Take 1 capsule (150 mg total) by mouth 2 (two) times daily. 06/03/18  Yes Dunn, Areta Haber, PA-C  diphenhydrAMINE (BENADRYL) 25 MG tablet Take 25 mg by mouth daily as needed for allergies.   Yes [provider]  folic acid (FOLVITE) 1 MG tablet Take 1 mg by mouth daily.   Yes [provider]  gabapentin (NEURONTIN) 300 MG capsule Take 1 capsule 3 times a day (Take 1 capsule 2-3 hours before bedtime) 01/15/19  Yes Venia Carbon, MD  hydroxychloroquine (PLAQUENIL) 200 MG tablet Take 200 mg by mouth 2 (two) times daily.    Yes [provider]  leflunomide (ARAVA) 20 MG tablet Take 20 mg by mouth every morning.  09/20/11  Yes [provider]  meclizine (ANTIVERT) 25 MG tablet Take 1 tablet (25 mg total) by mouth 3 (three) times daily as needed for dizziness. 03/09/19  Yes Venia Carbon, MD  metoprolol succinate (TOPROL-XL) 50 MG 24 hr tablet TAKE 1 TABLET BY MOUTH EVERY DAY WITH OR IMMEDIATELY FOLLOWING A MEAL Patient taking differently: Take 50 mg by mouth at bedtime.  01/16/19  Yes Venia Carbon,  MD  montelukast (SINGULAIR) 10 MG tablet TAKE 1 TABLET BY MOUTH AT BEDTIME 02/07/19  Yes Venia Carbon, MD  Oxycodone HCl 10 MG TABS Take 1-2 tablets (10-20 mg total) by mouth 2 (two) times daily as needed. 03/16/19  Yes Venia Carbon, MD  polyethylene glycol (MIRALAX / GLYCOLAX) packet Take 17 g by mouth daily as needed for mild constipation. 07/16/17  Yes Sainani, Belia Heman, MD  potassium chloride SA (KLOR-CON M20) 20 MEQ tablet TAKE 1/2 TABLET (=10MEQ) TWO TIMES A DAY ; MAY TAKE AND EXTRA TABLET WHEN TAKING TORSEMIDE 05/27/18  Yes Gollan, Kathlene November, MD  SF 5000 PLUS 1.1 % CREA dental cream Place 1 application onto teeth 2 (two) times daily. 06/01/17  Yes [provider]  tolterodine (DETROL LA) 2 MG 24 hr capsule TAKE 1 CAPSULE(2 MG) BY MOUTH DAILY AS NEEDED FOR BLADDER SPASMS 02/07/19  Yes Venia Carbon, MD  torsemide (DEMADEX) 20 MG tablet Take 20 mg by mouth twice daily as needed for swelling 08/11/17  Yes Mody, Ulice Bold, MD  Multiple Vitamin (MULTIVITAMIN) tablet Take 1 tablet by mouth daily.     [provider]  sodium chloride (OCEAN) 0.65 % SOLN nasal spray Place 1 spray into both nostrils as needed for congestion.    [provider]  terazosin (HYTRIN) 10 MG capsule TAKE 1 CAPSULE BY MOUTH AT BEDTIME 04/19/18   Venia Carbon, MD  ULORIC 40 MG tablet Take 40 mg by mouth daily. 01/01/19   [provider]    Physical Exam: Vitals:   03/30/19 0800 03/30/19 0845 03/30/19 1036 03/30/19 1038  BP: 140/66 137/69  120/63  Pulse: 65 65  67  Resp: _0 Temp:    98.3 F (36.8 C)  TempSrc:    Oral  SpO2: 91% 92%  93%  Weight:   (!) 144.4 kg   Height:       General: Not in acute distress HEENT:       Eyes: PERRL, EOMI, no scleral icterus.       ENT: No discharge from the ears and nose, no pharynx injection, no tonsillar enlargement.        Neck: Difficult to assess JVD due to obesity, no bruit, no mass felt. Heme: No neck lymph node enlargement. Cardiac: S1/S2, RRR, No murmurs, No gallops or rubs. Respiratory: No rales, wheezing, rhonchi or rubs. GI: Soft, nondistended, nontender, no rebound pain, no organomegaly, BS present. GU: No hematuria Ext: has trace leg edema bilaterally. 2+DP/PT pulse bilaterally. Musculoskeletal: has diffuse joint tenderness Skin: No rashes.  Neuro: Alert, oriented X3, cranial nerves II-XII grossly intact, moves all extremities. Psych: Patient is not psychotic, no suicidal or hemocidal ideation.  Labs on Admission: I have personally reviewed following labs and imaging studies  CBC: Recent Labs  Lab 03/30/19 0207  WBC 9.7  NEUTROABS 7.4  HGB 11.9*  HCT 37.0  MCV 85.6  PLT 741*   Basic Metabolic Panel: Recent Labs  Lab 03/30/19 0207  NA 132*  K 3.5  CL 98  CO2 23  GLUCOSE 114*  BUN 15  CREATININE 0.77  CALCIUM 9.4   GFR: Estimated Creatinine Clearance: 93.5 mL/min (by C-G formula based on SCr of 0.77 mg/dL). Liver Function Tests: Recent Labs  Lab 03/30/19 0207  AST 42*  ALT 21  ALKPHOS 84  BILITOT 2.5*  PROT 6.9  ALBUMIN 3.2*   No results for input(s): LIPASE, AMYLASE in the last 168 hours. No results  for input(s): AMMONIA in the  last 168 hours. Coagulation Profile: No results for input(s): INR, PROTIME in the last 168 hours. Cardiac Enzymes: Recent Labs  Lab 03/30/19 0157  CKTOTAL 307*   BNP (last 3 results) No results for input(s): PROBNP in the last 8760 hours. HbA1C: No results for input(s): HGBA1C in the last 72 hours. CBG: No results for input(s): GLUCAP in the last 168 hours. Lipid Profile: No results for input(s): CHOL, HDL, LDLCALC, TRIG, CHOLHDL, LDLDIRECT in the last 72 hours. Thyroid Function Tests: No results for input(s): TSH, T4TOTAL, FREET4, T3FREE, THYROIDAB in the last 72 hours. Anemia Panel: No results for input(s): VITAMINB12, FOLATE, FERRITIN, TIBC, IRON, RETICCTPCT in the last 72 hours. Urine analysis:    Component Value Date/Time   COLORURINE AMBER (A) 03/30/2019 0336   APPEARANCEUR CLEAR (A) 03/30/2019 0336   APPEARANCEUR Cloudy 03/28/2014 1033   LABSPEC 1.017 03/30/2019 0336   LABSPEC 1.018 03/28/2014 1033   PHURINE 5.0 03/30/2019 0336   GLUCOSEU NEGATIVE 03/30/2019 0336   GLUCOSEU Negative 03/28/2014 1033   HGBUR SMALL (A) 03/30/2019 0336   BILIRUBINUR NEGATIVE 03/30/2019 0336   BILIRUBINUR Negative 03/28/2014 1033   KETONESUR 5 (A) 03/30/2019 0336   PROTEINUR 100 (A) 03/30/2019 0336   NITRITE NEGATIVE 03/30/2019 0336   LEUKOCYTESUR NEGATIVE 03/30/2019 0336   LEUKOCYTESUR 3+ 03/28/2014 1033   Sepsis Labs: _0 (procalcitonin:4,lacticidven:4) ) Recent Results (from the past 240 hour(s))  Culture, blood (routine x 2)     Status: None (Preliminary result)   Collection Time: 03/30/19  1:57 AM   Specimen: BLOOD  Result Value Ref Range Status   Specimen Description BLOOD LEFT HAND  Final   Special Requests   Final    BOTTLES DRAWN AEROBIC AND ANAEROBIC Blood Culture adequate volume   Culture   Final    NO GROWTH < 12 HOURS Performed at [facility], [address] [place], [place] [address]    Report Status PENDING  Incomplete  SARS CORONAVIRUS 2  (TAT 6-24 HRS) Nasopharyngeal Nasopharyngeal Swab     Status: None   Collection Time: 03/30/19  1:58 AM   Specimen: Nasopharyngeal Swab  Result Value Ref Range Status   SARS Coronavirus 2 NEGATIVE NEGATIVE Final    Comment: (NOTE) SARS-CoV-2 target nucleic acids are NOT DETECTED. The SARS-CoV-2 RNA is generally detectable in upper and lower respiratory specimens during the acute phase of infection. Negative results do not preclude SARS-CoV-2 infection, do not rule out co-infections with other pathogens, and should not be used as the sole basis for treatment or other patient management decisions. Negative results must be combined with clinical observations, patient history, and epidemiological information. The expected result is Negative. Fact Sheet for Patients: SugarRoll.be Fact Sheet for Healthcare Providers: https://www.woods-mathews.com/ This test is not yet approved or cleared by the [place] FDA and  has been authorized for detection and/or diagnosis of SARS-CoV-2 by FDA under an Emergency Use Authorization (EUA). This EUA will remain  in effect (meaning this test can be used) for the duration of the COVID-19 declaration under Section 56 4(b)(1) of the Act, 21 U.S.C. section 360bbb-3(b)(1), unless the authorization is terminated or revoked sooner. Performed at [facility], [place] [address], [place], [place] [address]   Culture, blood (routine x 2)     Status: None (Preliminary result)   Collection Time: 03/30/19  2:06 AM   Specimen: Left Antecubital; Blood  Result Value Ref Range Status   Specimen Description LEFT ANTECUBITAL  Final  Special Requests   Final    Blood Culture results may not be optimal due to an excessive volume of blood received in culture bottles   Culture   Final    NO GROWTH < 12 HOURS Performed at [facility], [address], [place],  [address]    Report Status PENDING  Incomplete      Radiological Exams on Admission: DG Chest Port 1 View  Result Date: 03/30/2019 CLINICAL DATA:  Weakness. EXAM: PORTABLE CHEST 1 VIEW COMPARISON:  02/26/2015 FINDINGS: Cardiomegaly. Interstitial thickening. Streaky bibasilar opacities. No pneumothorax or large pleural effusion. Degenerative change of the right shoulder. Portable technique and soft tissue attenuation from habitus limits assessment. IMPRESSION: Cardiomegaly. Interstitial thickening which appears slightly increased from prior, may represent pulmonary edema or atypical infection superimposed on chronic lung disease. Electronically Signed   By: Keith Rake M.D.   On: 03/30/2019 03:05     EKG: Independently reviewed.  Sinus rhythm, QTC 460, low voltage, LAE, nonspecific T wave change  Assessment/Plan Principal Problem:   Acute on chronic diastolic CHF (congestive heart failure) (HCC) Active Problems:   HLD (hyperlipidemia)   HTN (hypertension)   Rheumatoid arthritis (HCC)   Gout   Coronary artery disease, non-occlusive   PAF (paroxysmal atrial fibrillation) (HCC)   Generalized weakness   Fall   COPD (chronic obstructive pulmonary disease) (HCC)   Elevated CK   Acute on chronic diastolic CHF (congestive heart failure) ([place]): 2D echo on 08/10/2014 showed EF 60-85%.  It is difficult to evaluate volume status due to obesity, but the patient has trace leg edema, elevated BNP 285, and chest x-ray showed cardiomegaly with mild pulmonary edema, indicating possible mild CHF exacerbation.  -will admit to tele bed as inpt. -Lasix 40 mg dailuy by IV (pt received 1 mg of Bumex in the ED) -2d echo -Daily weights -strict I/O's -Low salt diet -Fluid restriction  Rheumatoid arthritis: Patient has a worsening diffusely joint pain.  ESR 56, CRP 20.8 -Start prednisone 20 mg daily -Continue home Plaquenil, leflunomide  Hx of gout: -Uloric  Fall and generalized weakness: Most likely due to multifactorial etiology, including  diffused joint pain, CHF exacerbation.  No focal neurologic deficits on physical examination -PT/OT  HLD (hyperlipidemia) -lipitor  HTN:  -Continue home medications: Metoprolol, amlodipine, -hydralazine prn  Coronary artery disease, non-occlusive: no CP -continue Lipitor, BB  PAF (paroxysmal atrial fibrillation) ([place]): CHA2DS2-VASc Score is 5, needs oral anticoagulation. Patient is on pradaxa at home. Heart rate is well controlled. -continue Pradaxa, metoprolol  COPD (chronic obstructive pulmonary disease) and asthma: -Continue Singulair, bronchodilators  Elevated CK: CK 307, likelu due to fall.  -will repeat CK in AM -will not give IVF due to CHF since her renal function is normal.     Inpatient status:  # Patient requires inpatient status due to high intensity of service, high risk for further deterioration and high frequency of surveillance required.  I certify that at the point of admission it is my clinical judgment that the patient will require inpatient hospital care spanning beyond 2 midnights from the point of admission.  . This patient has multiple chronic comorbidities including hypertension, hyperlipidemia, COPD, asthma, gout, rheumatoid arthritis, peripheral Pradaxa, dCHF, CAD . Now patient has presenting with generalized weakness, multiple fall, CHF exacerbation, worsening joint pain . The worrisome physical exam findings include diffuse joint tenderness . The initial radiographic and laboratory data are worrisome because of elevated proBNP 285, chest x-ray showed cardiomegaly and mild pulmonary edema, elevated  ESR and CRP . Current medical needs: please see my assessment and plan . Predictability of an adverse outcome (risk): Patient has multiple comorbidities as listed above. Now presents with generalized weakness, multiple fall, CHF exacerbation, worsening joint pain. Patient's presentation is highly complicated.  Patient is at high risk of deteriorating.  Will  need to be treated in hospital for at least 2 days.      DVT ppx: on Pradaxa Code Status: Full code Family Communication: None at bed side.    Disposition Plan:  Anticipate discharge back to previous home environment Consults called:  none Admission status:  Tele bed as inpt    Date of Service 03/30/2019    [facility]   If 7PM-7AM, please contact night-coverage [email or web address] Password [facility] 03/30/2019, 2:56 PM

## 2019-03-30 NOTE — ED Notes (Signed)
Called floor pt is assigned to. They stated they are unable to take pt at this time. Informed charge Therapist, sports.

## 2019-03-30 NOTE — ED Provider Notes (Signed)
[facility] Emergency Department Provider Note   ____________________________________________   First MD Initiated Contact with Patient 03/30/19 0136     (approximate)  I have reviewed the triage vital signs and the nursing notes.   HISTORY  Chief Complaint Weakness   HPI Tina Pacheco is a 69 y.o. female brought to the ED via EMS from home with a chief complaint of generalized weakness.  Patient reports this is her third fall this week.  Describes them more as a "slide" from furniture.  Tonight she slid onto the floor from the commode and was unable to get back up even with her husband's assistance.  Also reports decreased oral intake.  EMS reports fever.  Patient denies cough, chest pain, shortness of breath, abdominal pain, nausea, vomiting or diarrhea.  States she has been having pain in all of her joints.  Denies COVID-19 exposure.  Ambulates with walker at baseline.  History of rheumatoid arthritis.  Takes Pradaxa for atrial fibrillation.       Past Medical History:  Diagnosis Date  . Arthritis    RA  . Asthma   . Basal cell carcinoma   . Cataract 2018   bilateral eyes; corrected with surgery  . Claustrophobia   . Collagen vascular disease (HCC)    RA  . COPD (chronic obstructive pulmonary disease) ([place])   . Diastolic dysfunction    a. echo 07/2014: EF 55-60%, no RWMA, GR2DD, mild MR, LA moderately dilated, PASP 38 mm Hg  . Dyslipidemia   . Dysrhythmia   . Headache    migraines  . Hemihypertrophy   . History of cardiac cath    a. 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 History of gout   . Hyperplastic colonic polyp 2003  . Hypertension   . Hypokalemia   . Morbid obesity ([place])   . PAF (paroxysmal atrial fibrillation) (HCC)    a. on Pradaxa; b. CHADSVASc at least 2 (HTN & female)  . Rheumatoid arthritis(714.0)   . Sleep apnea    a. not compliant with CPAP    Patient Active Problem List   Diagnosis Date Noted  . Urge  incontinence of urine 12/23/2018  . Left leg pain 09/18/2018  . Preventative health care 03/20/2018  . Mood disorder ([place]) 03/20/2018  . Advance directive discussed with patient 03/20/2018  . Ongoing leg pain 12/16/2017  . Lymphedema 11/26/2017  . Chronic diastolic heart failure ([place]) 07/06/2017  . Nausea 05/08/2017  . Narcotic dependence ([place]) 03/09/2017  . Vertigo 06/28/2016  . Bronchiectasis without acute exacerbation ([place]) 06/15/2015  . DDD (degenerative disc disease), lumbosacral 03/30/2015  . Sleep apnea   . Coronary artery disease, non-occlusive 08/25/2014  . PAF (paroxysmal atrial fibrillation) ([place]) 08/25/2014  . 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 RLS (restless legs syndrome) 07/06/2014  . Gout 05/26/2014  . Chronic pain syndrome 05/26/2014  . Migraine 04/08/2014  . Back pain 07/22/2013  . Metatarsalgia of right foot 07/22/2013  . Insomnia 07/15/2013  . OA (osteoarthritis) 11/20/2012  . DJD (degenerative joint disease) of cervical spine 09/10/2012  . Trapezius muscle spasm 07/21/2011  . Allergic rhinitis 07/10/2011  . Rheumatoid arthritis ([place]) 03/06/2011  . Morbid obesity ([place]) 06/22/2010  . Hyperlipidemia 06/22/2010  . HTN (hypertension) 06/22/2010  . Diastolic dysfunction 0000000    Past Surgical History:  Procedure Laterality Date  . ABDOMINAL HYSTERECTOMY    . APPLICATION OF WOUND VAC Right 08/09/2017   Procedure: APPLICATION OF WOUND VAC;  Surgeon: Albertine Patricia, DPM;  Location: [facility] ORS;  Service: Podiatry;  Laterality: Right;  . CARDIAC CATHETERIZATION  05/24/2010   nonobstructive CAD  . CATARACT EXTRACTION W/ INTRAOCULAR LENS IMPLANT Right 06/12/2016   Dr. Darleen Crocker  . CATARACT EXTRACTION W/ INTRAOCULAR LENS IMPLANT Left 06/26/2016   Dr. Darleen Crocker  . CESAREAN SECTION    . CHOLECYSTECTOMY    . COLONOSCOPY  06/12/2011   Procedure: COLONOSCOPY;  Surgeon: Juanita Craver, MD;  Location: [facility];  Service: Endoscopy;   Laterality: N/A;  . COLONOSCOPY N/A 03/17/2013   Procedure: COLONOSCOPY;  Surgeon: Juanita Craver, MD;  Location: [facility] ENDOSCOPY;  Service: Endoscopy;  Laterality: N/A;  . EYE SURGERY    . FOOT ARTHRODESIS Right 07/13/2017   Procedure: ARTHRODESIS FOOT-MULTI.FUSIONS (6 JOINTS);  Surgeon: Albertine Patricia, DPM;  Location: [facility] ORS;  Service: Podiatry;  Laterality: Right;  . IRRIGATION AND DEBRIDEMENT FOOT Right 08/09/2017   Procedure: IRRIGATION AND DEBRIDEMENT FOOT;  Surgeon: Albertine Patricia, DPM;  Location: [facility] ORS;  Service: Podiatry;  Laterality: Right;  . KNEE ARTHROSCOPY     bilateral  . SINUSOTOMY    . TOOTH EXTRACTION  12/2016  . VAGINAL HYSTERECTOMY      Prior to Admission medications   Medication Sig Start Date End Date Taking? Authorizing Provider  amLODipine (NORVASC) 5 MG tablet Take 5 mgs by mouth once daily in the evening 08/26/18  Yes Viviana Simpler I, MD  atorvastatin (LIPITOR) 20 MG tablet TAKE 1 TABLET DAILY AT 6PM 12/03/18  Yes Venia Carbon, MD  dabigatran (PRADAXA) 150 MG CAPS capsule Take 1 capsule (150 mg total) by mouth 2 (two) times daily. 06/03/18  Yes Dunn, Areta Haber, PA-C  diphenhydrAMINE (BENADRYL) 25 MG tablet Take 25 mg by mouth daily as needed for allergies.   Yes [provider]  folic acid (FOLVITE) 1 MG tablet Take 1 mg by mouth daily.   Yes [provider]  gabapentin (NEURONTIN) 300 MG capsule Take 1 capsule 3 times a day (Take 1 capsule 2-3 hours before bedtime) 01/15/19  Yes Venia Carbon, MD  hydroxychloroquine (PLAQUENIL) 200 MG tablet Take 200 mg by mouth 2 (two) times daily.    Yes [provider]  leflunomide (ARAVA) 20 MG tablet Take 20 mg by mouth every morning.  09/20/11  Yes [provider]  meclizine (ANTIVERT) 25 MG tablet Take 1 tablet (25 mg total) by mouth 3 (three) times daily as needed for dizziness. 03/09/19  Yes Venia Carbon, MD  metoprolol succinate (TOPROL-XL) 50 MG 24 hr tablet TAKE 1 TABLET BY  MOUTH EVERY DAY WITH OR IMMEDIATELY FOLLOWING A MEAL Patient taking differently: Take 50 mg by mouth at bedtime.  01/16/19  Yes Viviana Simpler I, MD  montelukast (SINGULAIR) 10 MG tablet TAKE 1 TABLET BY MOUTH AT BEDTIME 02/07/19  Yes Viviana Simpler I, MD  Oxycodone HCl 10 MG TABS Take 1-2 tablets (10-20 mg total) by mouth 2 (two) times daily as needed. 03/16/19  Yes Venia Carbon, MD  polyethylene glycol (MIRALAX / GLYCOLAX) packet Take 17 g by mouth daily as needed for mild constipation. 07/16/17  Yes Sainani, Belia Heman, MD  potassium chloride SA (KLOR-CON M20) 20 MEQ tablet TAKE 1/2 TABLET (=10MEQ) TWO TIMES A DAY ; MAY TAKE AND EXTRA TABLET WHEN TAKING TORSEMIDE 05/27/18  Yes Gollan, Kathlene November, MD  SF 5000 PLUS 1.1 % CREA dental cream Place 1 application onto teeth 2 (two) times daily. 06/01/17  Yes [provider]  tolterodine (DETROL LA) 2 MG 24 hr capsule TAKE 1 CAPSULE(2 MG) BY MOUTH DAILY AS NEEDED FOR BLADDER SPASMS 02/07/19  Yes Venia Carbon, MD  torsemide (DEMADEX) 20 MG tablet Take 20 mg by mouth twice daily as needed for swelling 08/11/17  Yes Mody, Ulice Bold, MD  Multiple Vitamin (MULTIVITAMIN) tablet Take 1 tablet by mouth daily.     [provider]  sodium chloride (OCEAN) 0.65 % SOLN nasal spray Place 1 spray into both nostrils as needed for congestion.    [provider]  terazosin (HYTRIN) 10 MG capsule TAKE 1 CAPSULE BY MOUTH AT BEDTIME 04/19/18   Venia Carbon, MD  ULORIC 40 MG tablet Take 40 mg by mouth daily. 01/01/19   [provider]    Allergies Codeine  Family History  Problem Relation Age of Onset  . Emphysema Father        smoked  . Tuberculosis Mother   . Parkinsonism Mother   . Diabetes type II Sister   . Breast cancer Sister   . Breast cancer Maternal Aunt   . Tuberculosis Sister     Social History Social History   Tobacco Use  . Smoking status: Never Smoker  . Smokeless tobacco: Never Used  Substance Use Topics   . Alcohol use: No  . Drug use: No    Review of Systems  Constitutional: Positive for generalized weakness and frequent falls.  No fever/chills Eyes: No visual changes. ENT: No sore throat. Cardiovascular: Denies chest pain. Respiratory: Denies shortness of breath. Gastrointestinal: No abdominal pain.  No nausea, no vomiting.  No diarrhea.  No constipation. Genitourinary: Negative for dysuria. Musculoskeletal: Positive for joint pains.  Negative for back pain. Skin: Negative for rash. Neurological: Negative for headaches, focal weakness or numbness.   ____________________________________________   PHYSICAL EXAM:  VITAL SIGNS: ED Triage Vitals  Enc Vitals Group     BP      Pulse      Resp      Temp      Temp src      SpO2      Weight      Height      Head Circumference      Peak Flow      Pain Score      Pain Loc      Pain Edu?      Excl. in [place]?     Constitutional: Alert and oriented.  Tired appearing and in mild acute distress. Eyes: Conjunctivae are normal. PERRL. EOMI. Head: Atraumatic. Nose: No congestion/rhinnorhea. Mouth/Throat: Mucous membranes are mildly dry.   Neck: No stridor.  No cervical spine tenderness to palpation. Cardiovascular: Normal rate, regular rhythm. Grossly normal heart sounds.  Good peripheral circulation. Respiratory: Normal respiratory effort.  No retractions. Lungs CTAB. Gastrointestinal: Obese.  Soft and nontender to light or deep palpation. No distention. No abdominal bruits. No CVA tenderness. Musculoskeletal: No lower extremity tenderness.  2+ BLE nonpitting edema.  No joint effusions. Neurologic: Alert and oriented x3.  CN II-XII grossly intact.  Normal speech and language. No gross focal neurologic deficits are appreciated.  Skin:  Skin is warm, dry and intact. No rash noted. Psychiatric: Mood and affect are normal. Speech and behavior are normal.  ____________________________________________   LABS (all labs ordered are  listed, but only abnormal results are displayed)  Labs Reviewed  CBC WITH DIFFERENTIAL/PLATELET - Abnormal; Notable for the following components:      Result Value   Hemoglobin 11.9 (*)  Platelets 142 (*)    All other components within normal limits  COMPREHENSIVE METABOLIC PANEL - Abnormal; Notable for the following components:   Sodium 132 (*)    Glucose, Bld 114 (*)    Albumin 3.2 (*)    AST 42 (*)    Total Bilirubin 2.5 (*)    All other components within normal limits  URINALYSIS, COMPLETE (UACMP) WITH MICROSCOPIC - Abnormal; Notable for the following components:   Color, Urine AMBER (*)    APPearance CLEAR (*)    Hgb urine dipstick SMALL (*)    Ketones, ur 5 (*)    Protein, ur 100 (*)    Bacteria, UA RARE (*)    All other components within normal limits  SEDIMENTATION RATE - Abnormal; Notable for the following components:   Sed Rate 56 (*)    All other components within normal limits  CK - Abnormal; Notable for the following components:   Total CK 307 (*)    All other components within normal limits  BRAIN NATRIURETIC PEPTIDE - Abnormal; Notable for the following components:   B Natriuretic Peptide 285.0 (*)    All other components within normal limits  CULTURE, BLOOD (ROUTINE X 2)  CULTURE, BLOOD (ROUTINE X 2)  URINE CULTURE  SARS CORONAVIRUS 2 (TAT 6-24 HRS)  LACTIC ACID, PLASMA  LACTIC ACID, PLASMA  C-REACTIVE PROTEIN  POC SARS CORONAVIRUS 2 AG -  ED  POC SARS CORONAVIRUS 2 AG  TROPONIN I (HIGH SENSITIVITY)  TROPONIN I (HIGH SENSITIVITY)   ____________________________________________  EKG  ED ECG REPORT I, SUNG,JADE J, the attending physician, personally viewed and interpreted this ECG.   Date: 03/30/2019  EKG Time: 0150  Rate: 84  Rhythm: normal EKG, normal sinus rhythm  Axis: Normal  Intervals:none  ST&T Change: Nonspecific  ____________________________________________  RADIOLOGY  ED MD interpretation: Pulmonary edema versus atypical  infection  Official radiology report(s): DG Chest Port 1 View  Result Date: 03/30/2019 CLINICAL DATA:  Weakness. EXAM: PORTABLE CHEST 1 VIEW COMPARISON:  02/26/2015 FINDINGS: Cardiomegaly. Interstitial thickening. Streaky bibasilar opacities. No pneumothorax or large pleural effusion. Degenerative change of the right shoulder. Portable technique and soft tissue attenuation from habitus limits assessment. IMPRESSION: Cardiomegaly. Interstitial thickening which appears slightly increased from prior, may represent pulmonary edema or atypical infection superimposed on chronic lung disease. Electronically Signed   By: Keith Rake M.D.   On: 03/30/2019 03:05    ____________________________________________   PROCEDURES  Procedure(s) performed (including Critical Care):  Procedures   ____________________________________________   INITIAL IMPRESSION / ASSESSMENT AND PLAN / ED COURSE  As part of my medical decision making, I reviewed the following data within the [place] notes reviewed and incorporated, Labs reviewed, EKG interpreted, Old chart reviewed, Radiograph reviewed, Discussed with admitting physician and Notes from prior ED visits     JAMIYAH PLESCIA was evaluated in Emergency Department on 03/30/2019 for the symptoms described in the history of present illness. She was evaluated in the context of the global COVID-19 pandemic, which necessitated consideration that the patient might be at risk for infection with the SARS-CoV-2 virus that causes COVID-19. Institutional protocols and algorithms that pertain to the evaluation of patients at risk for COVID-19 are in a state of rapid change based on information released by regulatory bodies including the CDC and federal and state organizations. These policies and algorithms were followed during the patient's care in the ED.    69 year old female presenting with generalized weakness and frequent falls  over the  past several days.  Differential diagnosis includes but is not limited to ACS, infectious, metabolic etiologies, etc.  Will obtain lab work, chest x-ray, COVID-19 swab.  Anticipate hospitalization given patient's frequent falls and generalized weakness.      ____________________________________________   FINAL CLINICAL IMPRESSION(S) / ED DIAGNOSES  Final diagnoses:  Generalized weakness  Fall, initial encounter  Acute on chronic congestive heart failure, unspecified heart failure type [facility])     ED Discharge Orders    None       Note:  This document was prepared using Dragon voice recognition software and may include unintentional dictation errors.   Paulette Blanch, MD 03/30/19 [phone]

## 2019-03-31 ENCOUNTER — Inpatient Hospital Stay (HOSPITAL_COMMUNITY)
Admit: 2019-03-31 | Discharge: 2019-03-31 | Disposition: A | Discharge status: Home / Self Care | Payer: Medicare Other | Attending: Internal Medicine | Admitting: Internal Medicine

## 2019-03-31 DIAGNOSIS — L8990 Pressure ulcer of unspecified site, unspecified stage: Secondary | ICD-10-CM | POA: Insufficient documentation

## 2019-03-31 DIAGNOSIS — I5031 Acute diastolic (congestive) heart failure: Secondary | ICD-10-CM

## 2019-03-31 DIAGNOSIS — I5033 Acute on chronic diastolic (congestive) heart failure: Secondary | ICD-10-CM

## 2019-03-31 LAB — URINE CULTURE: Culture: NO GROWTH

## 2019-03-31 LAB — ECHOCARDIOGRAM COMPLETE
Height: 63 in
Weight: 5178.16 oz

## 2019-03-31 LAB — BASIC METABOLIC PANEL
Anion gap: 8 (ref 5–15)
BUN: 19 mg/dL (ref 8–23)
CO2: 29 mmol/L (ref 22–32)
Calcium: 9.8 mg/dL (ref 8.9–10.3)
Chloride: 101 mmol/L (ref 98–111)
Creatinine, Ser: 0.67 mg/dL (ref 0.44–1.00)
GFR calc Af Amer: >60 mL/min (ref >60)
GFR calc non Af Amer: >60 mL/min (ref >60)
Glucose, Bld: 108 mg/dL — ABNORMAL HIGH (ref 70–99)
Potassium: 4.3 mmol/L (ref 3.5–5.1)
Sodium: 138 mmol/L (ref 135–145)

## 2019-03-31 LAB — CK: Total CK: 44 U/L (ref 38–234)

## 2019-03-31 LAB — HIV ANTIBODY (ROUTINE TESTING W REFLEX): HIV Screen 4th Generation wRfx: NONREACTIVE

## 2019-03-31 MED ORDER — TORSEMIDE 20 MG PO TABS
20.0000 mg | ORAL_TABLET | Freq: Every day | ORAL | Status: DC
  Administered 2019-04-01 – 2019-04-02 (×2): 20 mg via ORAL
  Filled 2019-03-31 (×4): qty 1

## 2019-03-31 MED ORDER — PERFLUTREN LIPID MICROSPHERE
1.0000 mL | INTRAVENOUS | Status: AC | PRN
  Administered 2019-03-31: 2 mL via INTRAVENOUS
  Filled 2019-03-31: qty 10

## 2019-03-31 MED ORDER — FUROSEMIDE 40 MG PO TABS: 40.0000 mg | ORAL_TABLET | Freq: Every day | ORAL | Status: DC

## 2019-03-31 NOTE — Progress Notes (Signed)
PROGRESS NOTE    Tina Pacheco  JHE:[id number] DOB: 11/24/1950 DOA: 03/30/2019 PCP: Venia Carbon, MD    Brief Narrative:  Tina Pacheco is a 69 y.o. female with medical history significant of hypertension, hyperlipidemia, COPD, asthma, gout, rheumatoid arthritis, peripheral Pradaxa, dCHF, CAD, who presents with generalized weakness, diffused joint pain, fall, shortness breath.  Patient states that she she has been feeling weak recently, which has been progressively worsening. She fell at least 3 times, without any injury.  She states that she just slided onto the floor.  Strongly denies any head or neck injury.  Refused CT scan for head and neck.  She does not have unilateral numbness or tingling symptoms this.  No facial droop or slurred speech.  She has history of rheumatoid arthritis. She states that her joint pain has been worsening recently. She has worsening pain in multiple joints, including both hands and both knees, both ankles.  She also reports mild shortness of breath, mild dry cough, no chest pain, fever or chills.  Denies nausea, vomiting, diarrhea, abdominal pain, symptoms of UTI.  2/1: Patient seen and examined.  On room air.  Diuresed effectively.  Clinical presentation is more consistent with rheumatoid arthritis exacerbation rather than acute decompensated heart failure.  Patient endorses severe weakness and inability to perform activities of daily living.  Worsening pain in multiple joints bilaterally.   Assessment & Plan:   Principal Problem:   Acute on chronic diastolic CHF (congestive heart failure) (HCC) Active Problems:   HLD (hyperlipidemia)   HTN (hypertension)   Rheumatoid arthritis (HCC)   Gout   Coronary artery disease, non-occlusive   PAF (paroxysmal atrial fibrillation) (HCC)   Generalized weakness   Fall   COPD (chronic obstructive pulmonary disease) (HCC)   Elevated CK   Pressure injury of skin  Rheumatoid arthritis, suspect acute flare:    Patient has a worsening diffusely joint pain.   ESR 56, CRP 20.8 Suspect that RA flare is the primary driver for patients admission -Continue prednisone 20 mg daily (not chronically taken, started on admission) -Continue home Plaquenil, leflunomide  Acute on chronic diastolic CHF (congestive heart failure) ([place]):  2D echo on 08/10/2014 showed EF 60-65%.  Patient improving over interval, now on RA Do not suspect over HF flare Plan: DC IV Lasix Restart home torsemide 53m daily -2d echo: pending -Daily weights -strict I/O's -Low salt diet -Fluid restriction  Hx of gout: -Uloric  Fall and generalized weakness Most likely due to multifactorial etiology, including diffused joint pain, CHF exacerbation.  No focal neurologic deficits on physical examination -PT/OT  HLD (hyperlipidemia) -lipitor  HTN:  -Continue home medications: Metoprolol, amlodipine, -hydralazine prn  Coronary artery disease, non-occlusive: no CP -continue Lipitor, BB  PAF (paroxysmal atrial fibrillation) ([place]: CHA2DS2-VASc Score is 5, needs oral anticoagulation. Patient is on pradaxa at home. Heart rate is well controlled. -continue Pradaxa, metoprolol  COPD (chronic obstructive pulmonary disease) and asthma: -Continue Singulair, bronchodilators  Elevated CK: CK 307, likelu due to fall.  -will repeat CK in AM -will not give IVF due to CHF since her renal function is normal.   DVT prophylaxis: Pradaxa Code Status: Full Family Communication: None today Disposition Plan: Home versus skilled nursing facility, pending improvement in joint pain and mobility   Consultants:   None  Procedures:   None  Antimicrobials:   None   Subjective: Seen and examined Per nursing patient's mobility is improved since admission Patient continues to endorse severe joint pain and weakness  Objective: Vitals:   03/31/19 0450 03/31/19 0602 03/31/19 0827 03/31/19 1146  BP: 136/73  (!) 147/65 131/62   Pulse: 61  66 66  Resp: '16  18 18  ' Temp: 97.8 F (36.6 C)  97.7 F (36.5 C) 97.8 F (36.6 C)  TempSrc: Oral  Oral Oral  SpO2: 95%  93% 96%  Weight:  (!) 146.8 kg    Height:        Intake/Output Summary (Last 24 hours) at 03/31/2019 1425 Last data filed at 03/31/2019 1413 Gross per 24 hour  Intake --  Output 2700 ml  Net -2700 ml   Filed Weights   03/30/19 0154 03/30/19 1036 03/31/19 0602  Weight: (!) 144.2 kg (!) 144.4 kg (!) 146.8 kg    Examination:  General exam: Appears calm and comfortable Respiratory system: Mild bibasilar crackles.  Normal work of breathing Cardiovascular system: S1-S2 heard, RRR, no murmurs, no pedal edema  gastrointestinal system: Obese, NT/ND.  Positive bowel sounds  Central nervous system: Alert and oriented. No focal neurological deficits. Extremities: Diffusely decreased strength Skin: No rashes, lesions or ulcers Psychiatry: Judgement and insight appear normal. Mood & affect appropriate.     Data Reviewed: I have personally reviewed following labs and imaging studies  CBC: Recent Labs  Lab 03/30/19 0207  WBC 9.7  NEUTROABS 7.4  HGB 11.9*  HCT 37.0  MCV 85.6  PLT 270*   Basic Metabolic Panel: Recent Labs  Lab 03/30/19 0207 03/31/19 0548  NA 132* 138  K 3.5 4.3  CL 98 101  CO2 23 29  GLUCOSE 114* 108*  BUN 15 19  CREATININE 0.77 0.67  CALCIUM 9.4 9.8   GFR: Estimated Creatinine Clearance: 94.5 mL/min (by C-G formula based on SCr of 0.67 mg/dL). Liver Function Tests: Recent Labs  Lab 03/30/19 0207  AST 42*  ALT 21  ALKPHOS 84  BILITOT 2.5*  PROT 6.9  ALBUMIN 3.2*   No results for input(s): LIPASE, AMYLASE in the last 168 hours. No results for input(s): AMMONIA in the last 168 hours. Coagulation Profile: No results for input(s): INR, PROTIME in the last 168 hours. Cardiac Enzymes: Recent Labs  Lab 03/30/19 0157 03/31/19 0548  CKTOTAL 307* 44   BNP (last 3 results) No results for input(s): PROBNP in the  last 8760 hours. HbA1C: No results for input(s): HGBA1C in the last 72 hours. CBG: No results for input(s): GLUCAP in the last 168 hours. Lipid Profile: No results for input(s): CHOL, HDL, LDLCALC, TRIG, CHOLHDL, LDLDIRECT in the last 72 hours. Thyroid Function Tests: No results for input(s): TSH, T4TOTAL, FREET4, T3FREE, THYROIDAB in the last 72 hours. Anemia Panel: No results for input(s): VITAMINB12, FOLATE, FERRITIN, TIBC, IRON, RETICCTPCT in the last 72 hours. Sepsis Labs: Recent Labs  Lab 03/30/19 0157 03/30/19 0400  LATICACIDVEN 1.4 1.0    Recent Results (from the past 240 hour(s))  Culture, blood (routine x 2)     Status: None (Preliminary result)   Collection Time: 03/30/19  1:57 AM   Specimen: BLOOD  Result Value Ref Range Status   Specimen Description BLOOD LEFT HAND  Final   Special Requests   Final    BOTTLES DRAWN AEROBIC AND ANAEROBIC Blood Culture adequate volume   Culture   Final    NO GROWTH 1 DAY Performed at [facility], [address] [place], [place] [address]    Report Status PENDING  Incomplete  Urine culture     Status: None   Collection Time: 03/30/19  1:54 AM   Specimen: Urine, Random  Result Value Ref Range Status   Specimen Description   Final    URINE, RANDOM Performed at [facility], [address], [place], [place] [address]    Special Requests   Final    NONE Performed at [facility], [address], [place], [place] [address]    Culture   Final    NO GROWTH Performed at [facility], [place] [address] [place], [place] [address]    Report Status 03/31/2019 FINAL  Final  SARS CORONAVIRUS 2 (TAT 6-24 HRS) Nasopharyngeal Nasopharyngeal Swab     Status: None   Collection Time: 03/30/19  1:58 AM   Specimen: Nasopharyngeal Swab  Result Value Ref Range Status   SARS Coronavirus 2 NEGATIVE NEGATIVE Final    Comment: (NOTE) SARS-CoV-2 target nucleic acids are NOT DETECTED. The SARS-CoV-2 RNA is  generally detectable in upper and lower respiratory specimens during the acute phase of infection. Negative results do not preclude SARS-CoV-2 infection, do not rule out co-infections with other pathogens, and should not be used as the sole basis for treatment or other patient management decisions. Negative results must be combined with clinical observations, patient history, and epidemiological information. The expected result is Negative. Fact Sheet for Patients: SugarRoll.be Fact Sheet for Healthcare Providers: https://www.woods-mathews.com/ This test is not yet approved or cleared by the [place] FDA and  has been authorized for detection and/or diagnosis of SARS-CoV-2 by FDA under an Emergency Use Authorization (EUA). This EUA will remain  in effect (meaning this test can be used) for the duration of the COVID-19 declaration under Section 56 4(b)(1) of the Act, 21 U.S.C. section 360bbb-3(b)(1), unless the authorization is terminated or revoked sooner. Performed at [facility], [place] [address] [place], [place] [address]   Culture, blood (routine x 2)     Status: None (Preliminary result)   Collection Time: 03/30/19  2:06 AM   Specimen: Left Antecubital; Blood  Result Value Ref Range Status   Specimen Description LEFT ANTECUBITAL  Final   Special Requests   Final    Blood Culture results may not be optimal due to an excessive volume of blood received in culture bottles   Culture   Final    NO GROWTH 1 DAY Performed at [facility], [address], [place], [place] [address]    Report Status PENDING  Incomplete         Radiology Studies: DG Chest Port 1 View  Result Date: 03/30/2019 CLINICAL DATA:  Weakness. EXAM: PORTABLE CHEST 1 VIEW COMPARISON:  02/26/2015 FINDINGS: Cardiomegaly. Interstitial thickening. Streaky bibasilar opacities. No pneumothorax or large pleural effusion. Degenerative change of the right  shoulder. Portable technique and soft tissue attenuation from habitus limits assessment. IMPRESSION: Cardiomegaly. Interstitial thickening which appears slightly increased from prior, may represent pulmonary edema or atypical infection superimposed on chronic lung disease. Electronically Signed   By: Keith Rake M.D.   On: 03/30/2019 03:05   ECHOCARDIOGRAM COMPLETE  Result Date: 03/31/2019   ECHOCARDIOGRAM REPORT   Patient Name:   Tina Pacheco Date of Exam: 03/31/2019 Medical Rec #:  [id number]       Height:       63.0 in Accession #:    [id number]      Weight:       323.6 lb Date of Birth:  1950/05/24       BSA:  2.37 m Patient Age:    33 years        BP:           136/73 mmHg Patient Gender: F               HR:           66 bpm. Exam Location:  [facility] Procedure: 2D Echo, Color Doppler, Cardiac Doppler and Intracardiac            Opacification Agent Indications:     I50.31 CHF-Acute Diastolic  History:         Patient has prior history of Echocardiogram examinations. CHF,                  COPD, Arrythmias:PAF; Risk Factors:Dyslipidemia.  Sonographer:     Charmayne Sheer RDCS (AE) Referring Phys:  Baker Janus Soledad Gerlach NIU Diagnosing Phys: Kathlyn Sacramento MD  Sonographer Comments: Technically difficult study due to poor echo windows. Image acquisition challenging due to patient body habitus and Image acquisition challenging due to COPD. IMPRESSIONS  1. Left ventricular ejection fraction, by visual estimation, is 55 to 60%. The left ventricle has normal function. There is mildly increased left ventricular hypertrophy.  2. Definity contrast agent was given IV to delineate the left ventricular endocardial borders.  3. Left ventricular diastolic parameters are consistent with Grade I diastolic dysfunction (impaired relaxation).  4. The left ventricle has no regional wall motion abnormalities.  5. Global right ventricle has normal systolic function.The right ventricular size is normal. No increase in right ventricular wall  thickness.  6. Left atrial size was mild-moderately dilated.  7. Right atrial size was normal.  8. Moderate mitral annular calcification.  9. The mitral valve is normal in structure. No evidence of mitral valve regurgitation. No evidence of mitral stenosis. 10. The tricuspid valve is normal in structure. Tricuspid valve regurgitation is not demonstrated. 11. The aortic valve is normal in structure. Aortic valve regurgitation is not visualized. Mild to moderate aortic valve sclerosis/calcification without any evidence of aortic stenosis. 12. The pulmonic valve was normal in structure. Pulmonic valve regurgitation is trivial. 13. TR signal is inadequate for assessing pulmonary artery systolic pressure. FINDINGS  Left Ventricle: Left ventricular ejection fraction, by visual estimation, is 55 to 60%. The left ventricle has normal function. Definity contrast agent was given IV to delineate the left ventricular endocardial borders. The left ventricle has no regional wall motion abnormalities. There is mildly increased left ventricular hypertrophy. Left ventricular diastolic parameters are consistent with Grade I diastolic dysfunction (impaired relaxation). Normal left atrial pressure. Right Ventricle: The right ventricular size is normal. No increase in right ventricular wall thickness. Global RV systolic function is has normal systolic function. Left Atrium: Left atrial size was mild-moderately dilated. Right Atrium: Right atrial size was normal in size Pericardium: There is no evidence of pericardial effusion. Mitral Valve: The mitral valve is normal in structure. Moderate mitral annular calcification. No evidence of mitral valve regurgitation. No evidence of mitral valve stenosis by observation. MV peak gradient, 5.1 mmHg. Tricuspid Valve: The tricuspid valve is normal in structure. Tricuspid valve regurgitation is not demonstrated. Aortic Valve: The aortic valve is normal in structure. Aortic valve regurgitation is  not visualized. Mild to moderate aortic valve sclerosis/calcification is present, without any evidence of aortic stenosis. Aortic valve mean gradient measures 5.0 mmHg. Aortic valve peak gradient measures 11.4 mmHg. Aortic valve area, by VTI measures 3.82 cm. Pulmonic Valve: The pulmonic valve was normal in structure. Pulmonic valve  regurgitation is trivial. Pulmonic regurgitation is trivial. Aorta: The aortic root, ascending aorta and aortic arch are all structurally normal, with no evidence of dilitation or obstruction. Venous: The inferior vena cava was not well visualized. IAS/Shunts: No atrial level shunt detected by color flow Doppler. There is no evidence of a patent foramen ovale. No ventricular septal defect is seen or detected. There is no evidence of an atrial septal defect.  LEFT VENTRICLE PLAX 2D LVIDd:         5.32 cm  Diastology LVIDs:         4.09 cm  LV e' lateral:   6.20 cm/s LV PW:         1.20 cm  LV E/e' lateral: 12.5 LV IVS:        0.92 cm  LV e' medial:    5.77 cm/s LVOT diam:     2.30 cm  LV E/e' medial:  13.4 LV SV:         63 ml LV SV Index:   23.63 LVOT Area:     4.15 cm  LEFT ATRIUM           Index LA diam:      5.00 cm 2.11 cm/m LA Vol (A4C): 93.3 ml 39.33 ml/m  AORTIC VALVE                    PULMONIC VALVE AV Area (Vmax):    2.75 cm     PV Vmax:       1.55 m/s AV Area (Vmean):   2.78 cm     PV Vmean:      104.000 cm/s AV Area (VTI):     3.82 cm     PV VTI:        0.424 m AV Vmax:           169.00 cm/s  PV Peak grad:  9.6 mmHg AV Vmean:          103.000 cm/s PV Mean grad:  5.0 mmHg AV VTI:            0.332 m AV Peak Grad:      11.4 mmHg AV Mean Grad:      5.0 mmHg LVOT Vmax:         112.00 cm/s LVOT Vmean:        68.900 cm/s LVOT VTI:          0.305 m LVOT/AV VTI ratio: 0.92  AORTA Ao Root diam: 3.00 cm MITRAL VALVE MV Area (PHT): 2.62 cm             SHUNTS MV Peak grad:  5.1 mmHg             Systemic VTI:  0.30 m MV Mean grad:  2.0 mmHg             Systemic Diam: 2.30 cm MV  Vmax:       1.13 m/s MV Vmean:      73.5 cm/s MV VTI:        0.31 m MV PHT:        83.81 msec MV Decel Time: 289 msec MV E velocity: 77.60 cm/s 103 cm/s MV A velocity: 87.30 cm/s 70.3 cm/s MV E/A ratio:  0.89       1.5  Kathlyn Sacramento MD Electronically signed by Kathlyn Sacramento MD Signature Date/Time: 03/31/2019/11:45:06 AM    Final         Scheduled Meds: . amLODipine  5  mg Oral QPM  . atorvastatin  20 mg Oral q1800  . dabigatran  150 mg Oral BID  . dextromethorphan-guaiFENesin  1 tablet Oral BID  . febuxostat  40 mg Oral Daily  . fesoterodine  4 mg Oral Daily  . folic acid  1 mg Oral Daily  . [START ON 04/01/2019] furosemide  40 mg Oral Daily  . gabapentin  300 mg Oral TID  . hydroxychloroquine  200 mg Oral BID  . leflunomide  20 mg Oral BH-q7a  . lisinopril  2.5 mg Oral Daily  . metoprolol succinate  50 mg Oral QHS  . montelukast  10 mg Oral QHS  . multivitamin with minerals  1 tablet Oral Daily  . predniSONE  20 mg Oral Q breakfast  . sodium chloride flush  3 mL Intravenous Q12H  . terazosin  10 mg Oral QHS   Continuous Infusions: . sodium chloride       LOS: 1 day    Time spent: 35 minutes    Sidney Ace, MD [facility] Pager [phone]  If 7PM-7AM, please contact night-coverage 03/31/2019, 2:25 PM

## 2019-03-31 NOTE — NC FL2 (Signed)
[place] LEVEL OF CARE SCREENING TOOL     IDENTIFICATION  Patient Name: Tina Pacheco Birthdate: 12-18-1950 Sex: female Admission Date (Current Location): 03/30/2019  [place] and [place] Number:  Engineering geologist and Address:  [facility], [address], [place], [place] [address]      Provider Number: [id number]  Attending Physician Name and Address:  Sidney Ace, MD  Relative Name and Phone Number:       Current Level of Care: Hospital Recommended Level of Care: [place] Prior Approval Number:    Date Approved/Denied:   PASRR Number: [id number]  Discharge Plan: SNF    Current Diagnoses: Patient Active Problem List   Diagnosis Date Noted  . Pressure injury of skin 03/31/2019  . Generalized weakness 03/30/2019  . Fall 03/30/2019  . COPD (chronic obstructive pulmonary disease) ([place]) 03/30/2019  . Elevated CK 03/30/2019  . Urge incontinence of urine 12/23/2018  . Left leg pain 09/18/2018  . Preventative health care 03/20/2018  . Mood disorder ([place]) 03/20/2018  . Advance directive discussed with patient 03/20/2018  . Ongoing leg pain 12/16/2017  . Lymphedema 11/26/2017  . Acute on chronic diastolic CHF (congestive heart failure) ([place]) 07/06/2017  . Nausea 05/08/2017  . Narcotic dependence ([place]) 03/09/2017  . Vertigo 06/28/2016  . Bronchiectasis without acute exacerbation ([place] 06/15/2015  . DDD (degenerative disc disease), lumbosacral 03/30/2015  . Sleep apnea   . Coronary artery disease, non-occlusive 08/25/2014  . PAF (paroxysmal atrial fibrillation) ([place]) 08/25/2014  . 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 RLS (restless legs syndrome) 07/06/2014  . Gout 05/26/2014  . Chronic pain syndrome 05/26/2014  . Migraine 04/08/2014  . Back pain 07/22/2013  . Metatarsalgia of right foot 07/22/2013  . Insomnia 07/15/2013  . OA (osteoarthritis) 11/20/2012  . DJD (degenerative joint  disease) of cervical spine 09/10/2012  . Trapezius muscle spasm 07/21/2011  . Allergic rhinitis 07/10/2011  . Rheumatoid arthritis ([place]) 03/06/2011  . Morbid obesity ([place]) 06/22/2010  . HLD (hyperlipidemia) 06/22/2010  . HTN (hypertension) 06/22/2010  . Diastolic dysfunction 0000000    Orientation RESPIRATION BLADDER Height & Weight     Self, Time, Situation, Place  Normal External catheter Weight: (!) 146.8 kg Height:  5\' 3"  (160 cm)  BEHAVIORAL SYMPTOMS/MOOD NEUROLOGICAL BOWEL NUTRITION STATUS      Continent Diet(Heart Healthy)  AMBULATORY STATUS COMMUNICATION OF NEEDS Skin   Extensive Assist Verbally Normal                       Personal Care Assistance Level of Assistance  Dressing, Bathing Bathing Assistance: Limited assistance   Dressing Assistance: Limited assistance     Functional Limitations Info             SPECIAL CARE FACTORS FREQUENCY  PT (By licensed PT), OT (By licensed OT)                    Contractures Contractures Info: Not present    Additional Factors Info  Code Status, Allergies Code Status Info: Full Allergies Info: Codeine           Current Medications (03/31/2019):  This is the current hospital active medication list Current Facility-Administered Medications  Medication Dose Route Frequency Provider Last Rate Last Admin  . 0.9 %  sodium chloride infusion  250 mL Intravenous PRN Ivor Costa, MD      . acetaminophen (TYLENOL) tablet 650 mg  650 mg  Oral Q6H PRN Ivor Costa, MD      . albuterol (VENTOLIN HFA) 108 (90 Base) MCG/ACT inhaler 2 puff  2 puff Inhalation Q4H PRN Ivor Costa, MD      . amLODipine (NORVASC) tablet 5 mg  5 mg Oral QPM Ivor Costa, MD   5 mg at 03/30/19 1624  . atorvastatin (LIPITOR) tablet 20 mg  20 mg Oral q1800 Ivor Costa, MD   20 mg at 03/30/19 1624  . dabigatran (PRADAXA) capsule 150 mg  150 mg Oral BID Ivor Costa, MD   150 mg at 03/31/19 0848  . dextromethorphan-guaiFENesin (MUCINEX DM) 30-600 MG per  12 hr tablet 1 tablet  1 tablet Oral BID Ivor Costa, MD   1 tablet at 03/30/19 2150  . diphenhydrAMINE (BENADRYL) capsule 25 mg  25 mg Oral Daily PRN Ivor Costa, MD      . febuxostat (ULORIC) tablet 40 mg  40 mg Oral Daily Ivor Costa, MD   40 mg at 03/31/19 0848  . fesoterodine (TOVIAZ) tablet 4 mg  4 mg Oral Daily Ivor Costa, MD   4 mg at 03/31/19 0848  . folic acid (FOLVITE) tablet 1 mg  1 mg Oral Daily Ivor Costa, MD   1 mg at 03/31/19 0845  . furosemide (LASIX) injection 40 mg  40 mg Intravenous Daily Ivor Costa, MD   40 mg at 03/31/19 [id number]  . gabapentin (NEURONTIN) capsule 300 mg  300 mg Oral TID Ivor Costa, MD   300 mg at 03/31/19 0847  . hydrALAZINE (APRESOLINE) tablet 25 mg  25 mg Oral TID PRN Ivor Costa, MD      . hydroxychloroquine (PLAQUENIL) tablet 200 mg  200 mg Oral BID Ivor Costa, MD   200 mg at 03/31/19 0847  . leflunomide (ARAVA) tablet 20 mg  20 mg Oral Gaye Alken, MD   20 mg at 03/31/19 0601  . lisinopril (ZESTRIL) tablet 2.5 mg  2.5 mg Oral Daily Ivor Costa, MD   2.5 mg at 03/31/19 0846  . meclizine (ANTIVERT) tablet 25 mg  25 mg Oral TID PRN Ivor Costa, MD      . metoprolol succinate (TOPROL-XL) 24 hr tablet 50 mg  50 mg Oral QHS Ivor Costa, MD   50 mg at 03/30/19 2151  . montelukast (SINGULAIR) tablet 10 mg  10 mg Oral QHS Ivor Costa, MD   10 mg at 03/30/19 2148  . morphine 2 MG/ML injection 1 mg  1 mg Intravenous Q4H PRN Ivor Costa, MD   1 mg at 03/30/19 1646  . multivitamin with minerals tablet 1 tablet  1 tablet Oral Daily Ivor Costa, MD   1 tablet at 03/31/19 0846  . ondansetron (ZOFRAN) injection 4 mg  4 mg Intravenous Q8H PRN Ivor Costa, MD      . oxyCODONE (Oxy IR/ROXICODONE) immediate release tablet 10 mg  10 mg Oral Q6H PRN Ivor Costa, MD   10 mg at 03/31/19 0847  . polyethylene glycol (MIRALAX / GLYCOLAX) packet 17 g  17 g Oral Daily PRN Ivor Costa, MD      . predniSONE (DELTASONE) tablet 20 mg  20 mg Oral Q breakfast Ivor Costa, MD   20 mg at 03/31/19 0846   . sodium chloride (OCEAN) 0.65 % nasal spray 1 spray  1 spray Each Nare PRN Ivor Costa, MD      . sodium chloride flush (NS) 0.9 % injection 3 mL  3 mL Intravenous Q12H Ivor Costa, MD   3  mL at 03/30/19 2152  . sodium chloride flush (NS) 0.9 % injection 3 mL  3 mL Intravenous PRN Ivor Costa, MD      . terazosin (HYTRIN) capsule 10 mg  10 mg Oral QHS Ivor Costa, MD   10 mg at 03/30/19 2150   Facility-Administered Medications Ordered in Other Encounters  Medication Dose Route Frequency Provider Last Rate Last Admin  . perflutren lipid microspheres (DEFINITY) IV suspension  1-10 mL Intravenous PRN Ivor Costa, MD   2 mL at 03/31/19 [id number]     Discharge Medications: Please see discharge summary for a list of discharge medications.  Relevant Imaging Results:  Relevant Lab Results:   Additional Information    Shelbie Ammons, RN

## 2019-03-31 NOTE — Progress Notes (Signed)
*  PRELIMINARY RESULTS* Echocardiogram 2D Echocardiogram has been performed.  Tina Pacheco 03/31/2019, 9:00 AM

## 2019-03-31 NOTE — Consult Note (Signed)
Reason for Consult: Rheumatoid arthritis  Referring Physician: Hospitalist  Tina Pacheco   HPI: 69 year old white female.  Long history of rheumatoid arthritis.  Status post methotrexate.  Currently on leflunomide and Plaquenil.  Thought to be stable Long history of osteoarthritis of the knees.  2 arthroscopies.   A year ago she had right foot surgery.  Plates.  Has been less ambulatory since.  Using a walker.  Last x-ray last year showed significant joint space narrowing.  She has not had knee replacements.  Has been walking with a walker Chronic back pain.  Sometimes walks with a walker behind her because she otherwise stoops forward.  Sleeps in a recliner.  Chronic analgesics.  OxyContin 10 mg twice daily Neuropathy.  No diabetes  Recent UTI.  Was on Cipro.  While taking Cipro she felt like she was a little weaker.  Slid and tried to go to the bathroom onto the floor on 3 occasions.  On the third occasion she came to the hospital.  She has not had significant numbness in the feet.  Feels like she was hurting all over from the tips of her toes to her neck.  There was some numbness in the toes when her feet were hyperextended but not since Concerned about congestive heart failure.  Had some diuretics No significant neck pain Obesity.  No history of sleep apnea In hospital CPK mildly elevated at 307.  Has been on statins.  Sed rate 56.  CRP 20.  CRP was 30 several months ago Did get up to the walker today..  Had a catheter urinary.  PMH: Obesity.  Gout.  COPD.  SURGICAL HISTORY: Cholecystectomy.  Right foot surgery,.  Bilateral knee arthroscopy  Family History: Diabetes  Social History: No cigarettes or alcohol  Allergies:  Allergies  Allergen Reactions  . Codeine     Nausea and vomiting    Medications:  Scheduled: . amLODipine  5 mg Oral QPM  . atorvastatin  20 mg Oral q1800  . dabigatran  150 mg Oral BID  . dextromethorphan-guaiFENesin  1 tablet Oral BID  . febuxostat   40 mg Oral Daily  . fesoterodine  4 mg Oral Daily  . folic acid  1 mg Oral Daily  . gabapentin  300 mg Oral TID  . hydroxychloroquine  200 mg Oral BID  . leflunomide  20 mg Oral BH-q7a  . lisinopril  2.5 mg Oral Daily  . metoprolol succinate  50 mg Oral QHS  . montelukast  10 mg Oral QHS  . multivitamin with minerals  1 tablet Oral Daily  . predniSONE  20 mg Oral Q breakfast  . sodium chloride flush  3 mL Intravenous Q12H  . terazosin  10 mg Oral QHS  . [START ON 04/01/2019] torsemide  20 mg Oral Daily        ROS: No chest pain.  No abdominal pain.  No diarrhea.   PHYSICAL EXAM: Blood pressure 124/61, pulse 67, temperature 97.9 F (36.6 C), resp. rate 17, height 5\' 3"  (1.6 m), weight (!) 146.8 kg, SpO2 96 %. Pleasant female.  Alert.  Able to communicate Sclera clear.  Clear chest.  Regular rhythm.  Nontender abdomen.  No visceromegaly.  1+ edema. Musculoskeletal: Decreased range of motion of her cervical spine.  Shoulders move reasonably.  Elbows without synovitis.  Tightness across the MCPs and wrists bilaterally.  PIPs not particularly tender.  Hips roll well.  Both knees have laxity.  Both knees valgus.  Decreased range  of motion both ankles worse on the right Neurologic: Grip is 5/5 bilaterally.  IVs in left arm but right arm biceps triceps 5/5.  Next flexors 5/5.  She can raise both of her legs against gravity but not against resistance.  Plantar and dorsiflexion in the bed seem to be 5/5.  She has bilateral knee jerks ankle jerks and biceps triceps jerks  Assessment: Recent weakness with slips/ falls.  It seems like her ambulation has been gradually decreasing after her foot surgery and with her significant arthritis of the knees.  Would always be concerned about a component of spinal stenosis given her chronic back pain or or from the cervical spine but it is not clearly apparent with exam today as she has reflexes.  Recent episode did occur in the setting of Cipro added to her  usual medicines  Component of rheumatoid arthritis, with no significant chronic deformities, but not well controlled given her symptoms sed rate and CRP.  However I am not sure this explains all of her decreased ambulation.  Chronic back pain.  Cannot rule out stenosis  Elevated CPK, mildly..  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  If we do not make much progress with therapy she will need some further evaluation of her back and neck( MRIs) to rule out significant stenosis. Continue Plaquenil and leflunomide for now.  We can reassess whether she needs another agent as an outpatient May need knee replacements on down the road. Recommend holding her statin with her elevated CPK  Tina Pacheco 03/31/2019, 5:12 PM

## 2019-03-31 NOTE — Plan of Care (Signed)

## 2019-03-31 NOTE — TOC Initial Note (Signed)
Transition of Care [facility]) - Initial/Assessment Note    Patient Details  Name: Tina Pacheco MRN: [id number] Date of Birth: 1950-04-02  Transition of [facility]) CM/SW Contact:    Shelbie Ammons, RN Phone Number: 03/31/2019, 10:25 AM  Clinical Narrative:    RNCM assessed patient at bedside who was sitting up in bed on phone and waiting to eat breakfast. Patient reports to feeling ok today, feels better that yesterday she reports. Patient reports she lives in single level home with her husband and her sister lives in the basement. Patient reports she no longer drives but other than that she is relatively independent at home. Patient reports that she has been to [facility] in the past when she had to have foot surgery. Patient reports that she does not want to go back into a rehab but understands it is important. Patient is aggreeable to beginning a bed search.           Expected Discharge Plan: Skilled Nursing Facility Barriers to Discharge: No Barriers Identified   Patient Goals and CMS Choice Patient states their goals for this hospitalization and ongoing recovery are:: I want to get strong enough to get back home.   Choice offered to / list presented to : Patient  Expected Discharge Plan and Services Expected Discharge Plan: [place] Acute Care Choice: [place] arrangements for the past 2 months: Single Family Home                                      Prior Living Arrangements/Services Living arrangements for the past 2 months: Single Family Home Lives with:: Spouse, Relatives Patient language and need for interpreter reviewed:: Yes Do you feel safe going back to the place where you live?: Yes      Need for Family Participation in Patient Care: Yes (Comment) Care giver support system in place?: Yes (comment)   Criminal Activity/Legal Involvement Pertinent to Current Situation/Hospitalization: No - Comment as  needed  Activities of Daily Living Home Assistive Devices/Equipment: Walker (specify type) ADL Screening (condition at time of admission) Patient's cognitive ability adequate to safely complete daily activities?: Yes Is the patient deaf or have difficulty hearing?: No Does the patient have difficulty seeing, even when wearing glasses/contacts?: No Does the patient have difficulty concentrating, remembering, or making decisions?: No Patient able to express need for assistance with ADLs?: Yes Does the patient have difficulty dressing or bathing?: Yes Independently performs ADLs?: Yes (appropriate for developmental age) Does the patient have difficulty walking or climbing stairs?: Yes Weakness of Legs: Both Weakness of Arms/Hands: Both  Permission Sought/Granted                  Emotional Assessment Appearance:: Appears stated age Attitude/Demeanor/Rapport: Engaged Affect (typically observed): Appropriate Orientation: : Oriented to Self, Oriented to Place, Oriented to  Time, Oriented to Situation Alcohol / Substance Use: Never Used Psych Involvement: No (comment)  Admission diagnosis:  Acute CHF (congestive heart failure) (HCC) [I50.9] Generalized weakness [R53.1] Acute on chronic diastolic CHF (congestive heart failure) ([place]) [I50.33] Fall, initial encounter [W19.XXXA] Acute on chronic congestive heart failure, unspecified heart failure type [facility]) [I50.9] Patient Active Problem List   Diagnosis Date Noted  . Pressure injury of skin 03/31/2019  . Generalized weakness 03/30/2019  . Fall 03/30/2019  . COPD (chronic obstructive pulmonary disease) ([place]) 03/30/2019  . Elevated  CK 03/30/2019  . Urge incontinence of urine 12/23/2018  . Left leg pain 09/18/2018  . Preventative health care 03/20/2018  . Mood disorder ([place]) 03/20/2018  . Advance directive discussed with patient 03/20/2018  . Ongoing leg pain 12/16/2017  . Lymphedema 11/26/2017  . Acute on chronic diastolic CHF  (congestive heart failure) ([place]) 07/06/2017  . Nausea 05/08/2017  . Narcotic dependence ([place]) 03/09/2017  . Vertigo 06/28/2016  . Bronchiectasis without acute exacerbation ([place]) 06/15/2015  . DDD (degenerative disc disease), lumbosacral 03/30/2015  . Sleep apnea   . Coronary artery disease, non-occlusive 08/25/2014  . PAF (paroxysmal atrial fibrillation) ([place]) 08/25/2014  . 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 RLS (restless legs syndrome) 07/06/2014  . Gout 05/26/2014  . Chronic pain syndrome 05/26/2014  . Migraine 04/08/2014  . Back pain 07/22/2013  . Metatarsalgia of right foot 07/22/2013  . Insomnia 07/15/2013  . OA (osteoarthritis) 11/20/2012  . DJD (degenerative joint disease) of cervical spine 09/10/2012  . Trapezius muscle spasm 07/21/2011  . Allergic rhinitis 07/10/2011  . Rheumatoid arthritis ([place]) 03/06/2011  . Morbid obesity ([place]) 06/22/2010  . HLD (hyperlipidemia) 06/22/2010  . HTN (hypertension) 06/22/2010  . Diastolic dysfunction 0000000   PCP:  Venia Carbon, MD Pharmacy:   [facility] [place] [place] [address] Phone: [phone] Fax: [phone]  [facility], [place] to [facility] [place] [address] Phone: [phone] Fax: [phone]     Social Determinants of Health (SDOH) Interventions    Readmission Risk Interventions No flowsheet data found.

## 2019-03-31 NOTE — Plan of Care (Signed)
  Problem: Education: Goal: Knowledge of General Education information will improve Description: Including pain rating scale, medication(s)/side effects and non-pharmacologic comfort measures Outcome: Progressing   Problem: Health Behavior/Discharge Planning: Goal: Ability to manage health-related needs will improve Outcome: Progressing   Problem: Clinical Measurements: Goal: Respiratory complications will improve Outcome: Progressing Goal: Cardiovascular complication will be avoided Outcome: Progressing   Problem: Pain Managment: Goal: General experience of comfort will improve Outcome: Progressing

## 2019-03-31 NOTE — Evaluation (Signed)
Occupational Therapy Evaluation Patient Details Name: Tina Pacheco MRN: [id number] DOB: Dec 20, 1950 Today's Date: 03/31/2019    History of Present Illness per MD note: GAYLEEN BLANKLEY is a 69 y.o. female with medical history significant of hypertension, hyperlipidemia, COPD, asthma, gout, rheumatoid arthritis, dCHF, CAD, who presents with generalized weakness, diffused joint pain, fall, shortness breath. Of note, pt describes recent falls as sliding down d/t joints giving way more so than actual fall.   Clinical Impression   Pt was seen for OT evaluation this date. Prior to hospital admission, pt was MOD I with 4WW primarily for fxl mobility, performed BADLs I'ly and required assist from husband for IADLs such as cooking, cleaning and transportation (pt hasn't driven since sx [id number]). Pt lives with spouse and her sister who is 45 years older and unable to assist. Currently pt demonstrates impairments as described below (See OT problem list) which functionally limit her ability to perform ADL/self-care tasks. Pt currently requires MOD A for bed mobility, MIN A for sit<>stand with RW from raised EOB, MOD A with LB dressing, and setup for UB ADLs. Pt only tolerates static standing x2-3 mins d/t pain and fatigue and extended time is required for all aspects of mobilization.  Pt would benefit from skilled OT to address noted impairments and functional limitations (see below for any additional details) in order to maximize safety and independence while minimizing falls risk and caregiver burden. Upon hospital discharge, recommend STR to maximize pt safety and return to PLOF.     Follow Up Recommendations  SNF    Equipment Recommendations  3 in 1 bedside commode;Hospital bed(BSC with drop arm)    Recommendations for Other Services       Precautions / Restrictions Precautions Precautions: Fall Restrictions Weight Bearing Restrictions: No      Mobility Bed Mobility Overal bed mobility: Needs  Assistance Bed Mobility: Supine to Sit;Sit to Supine     Supine to sit: Min assist Sit to supine: Mod assist      Transfers Overall transfer level: Needs assistance Equipment used: Rolling walker (2 wheeled) Transfers: Sit to/from Stand Sit to Stand: Min assist;From elevated surface              Balance Overall balance assessment: Needs assistance Sitting-balance support: No upper extremity supported Sitting balance-Leahy Scale: Good     Standing balance support: Bilateral upper extremity supported Standing balance-Leahy Scale: Fair Standing balance comment: Pt requires UE support and CGA for static standing balance.                           ADL either performed or assessed with clinical judgement   ADL                                         General ADL Comments: Pt requires MOD A to thread socks in EOB sitting using externally rotated hip position and able to perform UB ADLs with setup and increased time d/t R UE limited ROM. Pt performs ADL transfers with MIN A from elevated surface with RW.     Vision Patient Visual Report: No change from baseline       Perception     Praxis      Pertinent Vitals/Pain Pain Assessment: 0-10 Pain Score: 8  Pain Location: R shoulder with use, not as significant at rest. Pain Descriptors /  Indicators: Aching;Discomfort Pain Intervention(s): Limited activity within patient's tolerance;Monitored during session     Hand Dominance Right   Extremity/Trunk Assessment Upper Extremity Assessment Upper Extremity Assessment: RUE deficits/detail;LUE deficits/detail RUE Deficits / Details: shld 3-/5 (completes 3/4 arc of motion), elbow 3+/5, grip 3+/5 LUE Deficits / Details: shld and elbow 3+/5, grip 4-/5   Lower Extremity Assessment Lower Extremity Assessment: Defer to PT evaluation;Generalized weakness       Communication Communication Communication: No difficulties   Cognition  Arousal/Alertness: Awake/alert Behavior During Therapy: WFL for tasks assessed/performed Overall Cognitive Status: Within Functional Limits for tasks assessed                                 General Comments: A&O x4   General Comments       Exercises Other Exercises Other Exercises: OT facilitates education re: fall prevention including call light use and notifying pt of bed alarm. Pt verbalized understanding. Other Exercises: OT facilitates education re: importance of OOB activity including prevention of skin breakdown and prevention of opportunistic infection like PNA. Pt verbalized understanding.   Shoulder Instructions      Home Living Family/patient expects to be discharged to:: Private residence Living Arrangements: Spouse/significant other Available Help at Discharge: Family Type of Home: House Home Access: Level entry     Home Layout: Two level;Able to live on main level with bedroom/bathroom     Bathroom Shower/Tub: Walk-in shower(6" step into shower)         Home Equipment: Walker - 4 wheels;Cane - single point;Wheelchair - manual;Bedside commode;Grab bars - toilet;Shower seat - built in(vertical grab bar, right outside of shower, but next to commode, meant to be used for commode transfer, but pt uses to get in/out of tub/shower as well.)   Additional Comments: only used w/c after sx in 2019, reports primarily using 4WW-rollator      Prior Functioning/Environment Level of Independence: Independent with assistive device(s)        Comments: Pt reports peforming most ADLs I'ly, reports husband does cooking, cleaning, and driving. Pt performs light meal prep, sleeps in recliner most recently. Uses sit/lateral leabn techniuque for LB dressing        OT Problem List: Decreased strength;Decreased range of motion;Decreased activity tolerance;Impaired balance (sitting and/or standing);Obesity;Pain      OT Treatment/Interventions: Self-care/ADL  training;Therapeutic exercise;Energy conservation;DME and/or AE instruction;Therapeutic activities;Patient/family education;Balance training    OT Goals(Current goals can be found in the care plan section) Acute Rehab OT Goals Patient Stated Goal: to move better OT Goal Formulation: With patient Time For Goal Achievement: 04/14/19 Potential to Achieve Goals: Good  OT Frequency: Min 1X/week   Barriers to D/C:            Co-evaluation              AM-PAC OT "6 Clicks" Daily Activity     Outcome Measure Help from another person eating meals?: None Help from another person taking care of personal grooming?: A Little Help from another person toileting, which includes using toliet, bedpan, or urinal?: A Lot Help from another person bathing (including washing, rinsing, drying)?: A Lot Help from another person to put on and taking off regular upper body clothing?: A Little Help from another person to put on and taking off regular lower body clothing?: A Lot 6 Click Score: 16   End of Session Equipment Utilized During Treatment: Gait belt;Rolling walker Nurse Communication: Mobility status  Activity Tolerance: Patient tolerated treatment well Patient left: in bed;with call bell/phone within reach;with bed alarm set  OT Visit Diagnosis: Unsteadiness on feet (R26.81);Muscle weakness (generalized) (M62.81)                Time: [id number] OT Time Calculation (min): 77 min Charges:  OT General Charges $OT Visit: 1 Visit OT Evaluation $OT Eval Moderate Complexity: 1 Mod OT Treatments $Self Care/Home Management : 23-37 mins $Therapeutic Activity: 23-37 mins  Gerrianne Scale, MS, OTR/L ascom [phone] 03/31/19, 1:14 PM

## 2019-04-01 LAB — BASIC METABOLIC PANEL
Anion gap: 12 (ref 5–15)
BUN: 23 mg/dL (ref 8–23)
CO2: 27 mmol/L (ref 22–32)
Calcium: 9.6 mg/dL (ref 8.9–10.3)
Chloride: 101 mmol/L (ref 98–111)
Creatinine, Ser: 0.67 mg/dL (ref 0.44–1.00)
GFR calc Af Amer: >60 mL/min (ref >60)
GFR calc non Af Amer: >60 mL/min (ref >60)
Glucose, Bld: 104 mg/dL — ABNORMAL HIGH (ref 70–99)
Potassium: 3.7 mmol/L (ref 3.5–5.1)
Sodium: 140 mmol/L (ref 135–145)

## 2019-04-01 LAB — ABO/RH: ABO/RH(D): O POS

## 2019-04-01 MED ORDER — ENSURE ENLIVE PO LIQD
237.0000 mL | Freq: Two times a day (BID) | ORAL | Status: DC
  Administered 2019-04-02: 237 mL via ORAL

## 2019-04-01 MED ORDER — METHOCARBAMOL 500 MG PO TABS
500.0000 mg | ORAL_TABLET | Freq: Three times a day (TID) | ORAL | Status: DC
  Administered 2019-04-01 – 2019-04-04 (×10): 500 mg via ORAL
  Filled 2019-04-01 (×12): qty 1

## 2019-04-01 MED ORDER — DIPHENHYDRAMINE HCL 25 MG PO CAPS
25.0000 mg | ORAL_CAPSULE | Freq: Every evening | ORAL | Status: DC | PRN
  Administered 2019-04-01: 25 mg via ORAL

## 2019-04-01 NOTE — Progress Notes (Signed)
Physical Therapy Treatment Patient Details Name: Tina Pacheco MRN: [id number] DOB: 19-Dec-1950 Today's Date: 04/01/2019    History of Present Illness per MD note: Tina Pacheco is a 69 y.o. female with medical history significant of hypertension, hyperlipidemia, COPD, asthma, gout, rheumatoid arthritis, dCHF, CAD, who presents with generalized weakness, diffused joint pain, fall, shortness breath. Of note, pt describes recent falls as sliding down d/t joints giving way more so than actual fall.    PT Comments    Pt agrees to session despite fatigue from poor sleep.  Stated she needs to void but cannot use bedpan and purewick not working correctly for her.  Encouraged use of commode and she agrees.  To EOB with HOB raised and no physical assist.  She does not want to use RW and stands before I can don gait belt and does not sit back down on request.  "I'll be fine.  I'll just fall onto the bed if it do."  She is able to transfer using commode rails to bedside and voids.  Self care in standing then transfers back to bed by facing the bed and turning - the long way - vs to her right which is safer.  Once facing the bed she tries to put her knee up on the bed but was unable.  When questioned, she stated she got in bed like this at rehab because the bed was lower.  Instructed pt to sit and I assisted her LE's up onto the bed.  Discussed mobility with RN and tech.   Follow Up Recommendations  SNF     Equipment Recommendations  None recommended by PT    Recommendations for Other Services       Precautions / Restrictions Precautions Precautions: Fall Restrictions Weight Bearing Restrictions: No    Mobility  Bed Mobility Overal bed mobility: Needs Assistance Bed Mobility: Supine to Sit;Sit to Supine     Supine to sit: Min guard Sit to supine: Mod assist   General bed mobility comments: assist for legs back into bed.  sleeps in recliner at home.  Transfers Overall transfer level:  Needs assistance Equipment used: Rolling walker (2 wheeled) Transfers: Sit to/from Stand Sit to Stand: Min guard         General transfer comment: able to stand pivot to commode and back.  declined use of walker  Ambulation/Gait                 Stairs             Wheelchair Mobility    Modified Rankin (Stroke Patients Only)       Balance Overall balance assessment: Needs assistance Sitting-balance support: No upper extremity supported Sitting balance-Leahy Scale: Good     Standing balance support: Bilateral upper extremity supported Standing balance-Leahy Scale: Fair Standing balance comment: Pt requires UE support and CGA for static standing balance.                            Cognition Arousal/Alertness: Awake/alert Behavior During Therapy: WFL for tasks assessed/performed Overall Cognitive Status: Within Functional Limits for tasks assessed                                 General Comments: A&O x4      Exercises Other Exercises Other Exercises: to commode to void    General Comments  Pertinent Vitals/Pain Pain Assessment: 0-10 Pain Score: 4  Pain Location: general achiness all over Pain Descriptors / Indicators: Aching;Discomfort Pain Intervention(s): Limited activity within patient's tolerance;Monitored during session    Home Living                      Prior Function            PT Goals (current goals can now be found in the care plan section) Progress towards PT goals: Progressing toward goals    Frequency    Min 2X/week      PT Plan Current plan remains appropriate    Co-evaluation              AM-PAC PT "6 Clicks" Mobility   Outcome Measure  Help needed turning from your back to your side while in a flat bed without using bedrails?: A Little Help needed moving from lying on your back to sitting on the side of a flat bed without using bedrails?: A Lot Help needed moving  to and from a bed to a chair (including a wheelchair)?: A Little Help needed standing up from a chair using your arms (e.g., wheelchair or bedside chair)?: A Little Help needed to walk in hospital room?: A Lot Help needed climbing 3-5 steps with a railing? : Total 6 Click Score: 14    End of Session   Activity Tolerance: Patient tolerated treatment well Patient left: in bed;with bed alarm set;with call bell/phone within reach Nurse Communication: Mobility status;Other (comment)       Time: [id number] PT Time Calculation (min) (ACUTE ONLY): 25 min  Charges:  $Therapeutic Activity: 23-37 mins                     Chesley Noon, PTA 04/01/19, 10:14 AM

## 2019-04-01 NOTE — Progress Notes (Signed)
Nutrition Education Note  RD consulted for nutrition education regarding CHF.  69 y.o.femalewith medical history significant ofhypertension, hyperlipidemia, COPD, asthma, gout, rheumatoid arthritis, peripheral Pradaxa,dCHF, CAD, who presents with generalized weakness, diffusedjoint pain, fall, shortness breath.  Spoke with pt via phone. Pt reports decreased appetite and oral intake in hospital; pt reports eating 50% of her meals. Pt reports good appetite and oral intake at baseline. Pt lives alone and does not cook. Pt eats mostly processed and canned foods. Per pt's daughter, pt snacks on cookies and chips throughout the day.     RD provided "Low Sodium Nutrition Therapy" handout from the Academy of [employer] and [employer] via mail. Reviewed patient's dietary recall. Provided examples on ways to decrease sodium intake in diet. Discouraged intake of processed foods and use of salt shaker. Encouraged fresh fruits and vegetables as well as whole grain sources of carbohydrates to maximize fiber intake.  RD discussed why it is important for patient to adhere to diet recommendations, and emphasized the role of fluids, foods to avoid, and importance of weighing self daily. Teach back method used.  Expect poor compliance.  Body mass index is 56.14 kg/m. Pt meets criteria for morbid obesity based on current BMI.  Current diet order is HH, patient is consuming approximately 50% of meals at this time. Labs and medications reviewed.  RD will add Ensure Enlive po BID to help pt meet her estimated needs, each supplement provides 350 kcal and 20 grams of protein   No further nutrition interventions warranted at this time. RD contact information provided. If additional nutrition issues arise, please re-consult RD.   Koleen Distance MS, RD, LDN Pager #- [phone] Office#- [phone] After Hours Pager: [phone]

## 2019-04-01 NOTE — Progress Notes (Signed)
ReDS clip reading cannot be performed d/t pt's elevated BMI.

## 2019-04-01 NOTE — Progress Notes (Signed)
Patient requests sleep aide, BRandol Kern notified.

## 2019-04-01 NOTE — TOC Progression Note (Signed)
Transition of Care [facility]) - Progression Note    Patient Details  Name: SHIRRELL DELMONICO MRN: [id number] Date of Birth: [birth year]  Transition of Care [facility]) CM/SW [place], RN Phone Number: 04/01/2019, 10:05 AM  Clinical Narrative:      Spoke with patient, accepted bed offer at [facility].  Spoke with Provider, patient is not ready for discharge today, possibly tomorrow.  [place] will need another Covid test.  Bed will be held until Thursday and we will revisit if patient is not ready for discharge.  Covid Test For [facility] are good for 72 hours.  RNCM will continue to follow patient   Expected Discharge Plan: [place] Barriers to Discharge: Continued Medical Work up  Expected Discharge Plan and Services Expected Discharge Plan: [place] Choice: [place] arrangements for the past 2 months: Single Family Home                 DME Arranged: N/A DME Agency: NA       HH Arranged: NA HH Agency: NA         Social Determinants of Health (SDOH) Interventions    Readmission Risk Interventions No flowsheet data found.

## 2019-04-01 NOTE — Progress Notes (Signed)
PROGRESS NOTE    Tina Pacheco  [id number] DOB: 07-13-1950 DOA: 03/30/2019 PCP: Venia Carbon, MD    Brief Narrative:  Tina Pacheco is a 69 y.o. female with medical history significant of hypertension, hyperlipidemia, COPD, asthma, gout, rheumatoid arthritis, peripheral Pradaxa, dCHF, CAD, who presents with generalized weakness, diffused joint pain, fall, shortness breath.  Patient states that she she has been feeling weak recently, which has been progressively worsening. She fell at least 3 times, without any injury.  She states that she just slided onto the floor.  Strongly denies any head or neck injury.  Refused CT scan for head and neck.  She does not have unilateral numbness or tingling symptoms this.  No facial droop or slurred speech.  She has history of rheumatoid arthritis. She states that her joint pain has been worsening recently. She has worsening pain in multiple joints, including both hands and both knees, both ankles.  She also reports mild shortness of breath, mild dry cough, no chest pain, fever or chills.  Denies nausea, vomiting, diarrhea, abdominal pain, symptoms of UTI.  2/1: Patient seen and examined.  On room air.  Diuresed effectively.  Clinical presentation is more consistent with rheumatoid arthritis exacerbation rather than acute decompensated heart failure.  Patient endorses severe weakness and inability to perform activities of daily living.  Worsening pain in multiple joints bilaterally.  2/2: Patient seen and examined.  Remains on room air.  Seen by rheumatology yesterday.  Greatly appreciate consultation.  Appears to be responding to the prednisone therapy however continues to endorse severe pain and weakness bilaterally in both large and small joints.  Also continues to endorse back pain.   Assessment & Plan:   Principal Problem:   Acute on chronic diastolic CHF (congestive heart failure) (HCC) Active Problems:   HLD (hyperlipidemia)   HTN  (hypertension)   Rheumatoid arthritis (HCC)   Gout   Coronary artery disease, non-occlusive   PAF (paroxysmal atrial fibrillation) (HCC)   Generalized weakness   Fall   COPD (chronic obstructive pulmonary disease) (HCC)   Elevated CK   Pressure injury of skin  Rheumatoid arthritis, suspect acute flare:  Patient has a worsening diffusely joint pain.   ESR 56, CRP 20.8 Suspect that RA flare is the primary driver for symptoms leading up to patient's admission -Continue prednisone 20 mg daily (not chronically taken, started on admission) -Continue home Plaquenil, leflunomide -Rheumatology consult greatly appreciated -Recommend trial of burst dose prednisone, with plans to initiate long-term taper on discharge -May also be an element of osteoarthritis that is contributing to the complaints -Trial of Robaxin 500 mg p.o. 3 times daily -PT and OT consults, recommending skilled nursing facility -If pain improved plan on discharge to skilled nursing facility for rehabilitation with follow-up with established rheumatologist -If pain persists despite interventions consider advanced imaging of the spine for evaluation of spinal stenosis  Acute on chronic diastolic CHF (congestive heart failure) ([name]):  2D echo on 08/10/2014 showed EF 60-65%.  Patient improving over interval, now on RA Do not suspect over HF flare Plan: Continue home torsemide 51m daily -2d echo: EF 55 to 60% with no wall motion abnormalities -Daily weights -strict I/O's -Low salt diet -Fluid restriction  Hx of gout: -U[place]ic  Fall and generalized weakness Most likely due to multifactorial etiology, including diffused joint pain, CHF exacerbation.  No focal neurologic deficits on physical examination -PT/OT, recommending skilled nursing facility  HLD (hyperlipidemia) -lipitor, on hold due to elevated CK  HTN:  -Continue home medications: Metoprolol, amlodipine, -hydralazine prn  Coronary artery disease,  non-occlusive: no CP Continue beta-blocker Statin on hold due to elevated CK  PAF (paroxysmal atrial fibrillation) ([place]): CHA2DS2-VASc Score is 5, needs oral anticoagulation. Patient is on pradaxa at home. Heart rate is well controlled. -continue Pradaxa, metoprolol  COPD (chronic obstructive pulmonary disease) and asthma: -Continue Singulair, bronchodilators  Elevated CK: CK 307, likelu due to fall.  CK improving Statin on hold   DVT prophylaxis: Pradaxa Code Status: Full Family Communication: None today Disposition Plan: Skilled nursing facility, pending improvement in pain control  Consultants:   Rheumatology-Dr. Jefm Bryant  Procedures:   None  Antimicrobials:   None   Subjective: Seen and examined Per nursing patient's mobility is improved since admission Patient continues to endorse severe joint pain and weakness  Objective: Vitals:   03/31/19 2044 04/01/19 0610 04/01/19 0746 04/01/19 1141  BP: 131/63 (!) 142/72 138/70 (!) 144/65  Pulse: 64 64 63 69  Resp:  '18 16 18  ' Temp: 97.9 F (36.6 C) 97.7 F (36.5 C) 97.9 F (36.6 C) 97.6 F (36.4 C)  TempSrc: Oral Oral Oral Oral  SpO2: 95% 94% 95% 94%  Weight:  (!) 143.7 kg    Height:        Intake/Output Summary (Last 24 hours) at 04/01/2019 1327 Last data filed at 04/01/2019 6226 Gross per 24 hour  Intake --  Output 1850 ml  Net -1850 ml   Filed Weights   03/30/19 1036 03/31/19 0602 04/01/19 0610  Weight: (!) 144.4 kg (!) 146.8 kg (!) 143.7 kg    Examination:  General exam: Appears calm and comfortable Respiratory system: Mild bibasilar crackles.  Normal work of breathing Cardiovascular system: S1-S2 heard, RRR, no murmurs, no pedal edema  gastrointestinal system: Obese, NT/ND.  Positive bowel sounds  Central nervous system: Alert and oriented. No focal neurological deficits. Extremities: Diffusely decreased strength Skin: No rashes, lesions or ulcers Psychiatry: Judgement and insight appear  normal. Mood & affect appropriate.     Data Reviewed: I have personally reviewed following labs and imaging studies  CBC: Recent Labs  Lab 03/30/19 0207  WBC 9.7  NEUTROABS 7.4  HGB 11.9*  HCT 37.0  MCV 85.6  PLT 333*   Basic Metabolic Panel: Recent Labs  Lab 03/30/19 0207 03/31/19 0548 04/01/19 0642  NA 132* 138 140  K 3.5 4.3 3.7  CL 98 101 101  CO2 '23 29 27  ' GLUCOSE 114* 108* 104*  BUN '15 19 23  ' CREATININE 0.77 0.67 0.67  CALCIUM 9.4 9.8 9.6   GFR: Estimated Creatinine Clearance: 93.1 mL/min (by C-G formula based on SCr of 0.67 mg/dL). Liver Function Tests: Recent Labs  Lab 03/30/19 0207  AST 42*  ALT 21  ALKPHOS 84  BILITOT 2.5*  PROT 6.9  ALBUMIN 3.2*   No results for input(s): LIPASE, AMYLASE in the last 168 hours. No results for input(s): AMMONIA in the last 168 hours. Coagulation Profile: No results for input(s): INR, PROTIME in the last 168 hours. Cardiac Enzymes: Recent Labs  Lab 03/30/19 0157 03/31/19 0548  CKTOTAL 307* 44   BNP (last 3 results) No results for input(s): PROBNP in the last 8760 hours. HbA1C: No results for input(s): HGBA1C in the last 72 hours. CBG: No results for input(s): GLUCAP in the last 168 hours. Lipid Profile: No results for input(s): CHOL, HDL, LDLCALC, TRIG, CHOLHDL, LDLDIRECT in the last 72 hours. Thyroid Function Tests: No results for input(s): TSH, T4TOTAL, FREET4, T3FREE,  THYROIDAB in the last 72 hours. Anemia Panel: No results for input(s): VITAMINB12, FOLATE, FERRITIN, TIBC, IRON, RETICCTPCT in the last 72 hours. Sepsis Labs: Recent Labs  Lab 03/30/19 0157 03/30/19 0400  LATICACIDVEN 1.4 1.0    Recent Results (from the past 240 hour(s))  Culture, blood (routine x 2)     Status: None (Preliminary result)   Collection Time: 03/30/19  1:57 AM   Specimen: BLOOD  Result Value Ref Range Status   Specimen Description BLOOD LEFT HAND  Final   Special Requests   Final    BOTTLES DRAWN AEROBIC AND  ANAEROBIC Blood Culture adequate volume   Culture   Final    NO GROWTH 2 DAYS Performed at [facility], [address] [place], [place] [address]    Report Status PENDING  Incomplete  Urine culture     Status: None   Collection Time: 03/30/19  1:57 AM   Specimen: Urine, Random  Result Value Ref Range Status   Specimen Description   Final    URINE, RANDOM Performed at [facility], [address], [place], [place] [address]    Special Requests   Final    NONE Performed at [facility], [address], [place], [place] [address]    Culture   Final    NO GROWTH Performed at [facility], [place] [address], [place], [place] [address]    Report Status 03/31/2019 FINAL  Final  SARS CORONAVIRUS 2 (TAT 6-24 HRS) Nasopharyngeal Nasopharyngeal Swab     Status: None   Collection Time: 03/30/19  1:58 AM   Specimen: Nasopharyngeal Swab  Result Value Ref Range Status   SARS Coronavirus 2 NEGATIVE NEGATIVE Final    Comment: (NOTE) SARS-CoV-2 target nucleic acids are NOT DETECTED. The SARS-CoV-2 RNA is generally detectable in upper and lower respiratory specimens during the acute phase of infection. Negative results do not preclude SARS-CoV-2 infection, do not rule out co-infections with other pathogens, and should not be used as the sole basis for treatment or other patient management decisions. Negative results must be combined with clinical observations, patient history, and epidemiological information. The expected result is Negative. Fact Sheet for Patients: SugarRoll.be Fact Sheet for Healthcare Providers: https://www.woods-mathews.com/ This test is not yet approved or cleared by the [place] FDA and  has been authorized for detection and/or diagnosis of SARS-CoV-2 by FDA under an Emergency Use Authorization (EUA). This EUA will remain  in effect (meaning this test can be used) for the duration of  the COVID-19 declaration under Section 56 4(b)(1) of the Act, 21 U.S.C. section 360bbb-3(b)(1), unless the authorization is terminated or revoked sooner. Performed at [facility], [place] [address], [place], [place] [address]   Culture, blood (routine x 2)     Status: None (Preliminary result)   Collection Time: 03/30/19  2:06 AM   Specimen: Left Antecubital; Blood  Result Value Ref Range Status   Specimen Description LEFT ANTECUBITAL  Final   Special Requests   Final    Blood Culture results may not be optimal due to an excessive volume of blood received in culture bottles   Culture   Final    NO GROWTH 2 DAYS Performed at [facility], [address], [place], [place] [address]    Report Status PENDING  Incomplete         Radiology Studies: ECHOCARDIOGRAM COMPLETE  Result Date: 03/31/2019   ECHOCARDIOGRAM REPORT   Patient Name:   [facility]  Patrick Jupiter Date of Exam: 03/31/2019 Medical Rec #:  [id number]       Height:       63.0 in Accession #:    [id number]      Weight:       323.6 lb Date of Birth:  [birth year]       BSA:          2.37 m Patient Age:    69 years        BP:           136/73 mmHg Patient Gender: F               HR:           66 bpm. Exam Location:  [facility] Procedure: 2D Echo, Color Doppler, Cardiac Doppler and Intracardiac            Opacification Agent Indications:     I50.31 CHF-Acute Diastolic  History:         Patient has prior history of Echocardiogram examinations. CHF,                  COPD, Arrythmias:PAF; Risk Factors:Dyslipidemia.  Sonographer:     Charmayne Sheer RDCS (AE) Referring Phys:  Baker Janus Soledad Gerlach NIU Diagnosing Phys: Kathlyn Sacramento MD  Sonographer Comments: Technically difficult study due to poor echo windows. Image acquisition challenging due to patient body habitus and Image acquisition challenging due to COPD. IMPRESSIONS  1. Left ventricular ejection fraction, by visual estimation, is 55 to 60%. The left ventricle has normal function. There is mildly  increased left ventricular hypertrophy.  2. Definity contrast agent was given IV to delineate the left ventricular endocardial borders.  3. Left ventricular diastolic parameters are consistent with Grade I diastolic dysfunction (impaired relaxation).  4. The left ventricle has no regional wall motion abnormalities.  5. Global right ventricle has normal systolic function.The right ventricular size is normal. No increase in right ventricular wall thickness.  6. Left atrial size was mild-moderately dilated.  7. Right atrial size was normal.  8. Moderate mitral annular calcification.  9. The mitral valve is normal in structure. No evidence of mitral valve regurgitation. No evidence of mitral stenosis. 10. The tricuspid valve is normal in structure. Tricuspid valve regurgitation is not demonstrated. 11. The aortic valve is normal in structure. Aortic valve regurgitation is not visualized. Mild to moderate aortic valve sclerosis/calcification without any evidence of aortic stenosis. 12. The pulmonic valve was normal in structure. Pulmonic valve regurgitation is trivial. 13. TR signal is inadequate for assessing pulmonary artery systolic pressure. FINDINGS  Left Ventricle: Left ventricular ejection fraction, by visual estimation, is 55 to 60%. The left ventricle has normal function. Definity contrast agent was given IV to delineate the left ventricular endocardial borders. The left ventricle has no regional wall motion abnormalities. There is mildly increased left ventricular hypertrophy. Left ventricular diastolic parameters are consistent with Grade I diastolic dysfunction (impaired relaxation). Normal left atrial pressure. Right Ventricle: The right ventricular size is normal. No increase in right ventricular wall thickness. Global RV systolic function is has normal systolic function. Left Atrium: Left atrial size was mild-moderately dilated. Right Atrium: Right atrial size was normal in size Pericardium: There is no  evidence of pericardial effusion. Mitral Valve: The mitral valve is normal in structure. Moderate mitral annular calcification. No evidence of mitral valve regurgitation. No evidence of mitral valve stenosis by observation. MV peak gradient, 5.1 mmHg. Tricuspid Valve: The tricuspid valve is normal in structure. Tricuspid  valve regurgitation is not demonstrated. Aortic Valve: The aortic valve is normal in structure. Aortic valve regurgitation is not visualized. Mild to moderate aortic valve sclerosis/calcification is present, without any evidence of aortic stenosis. Aortic valve mean gradient measures 5.0 mmHg. Aortic valve peak gradient measures 11.4 mmHg. Aortic valve area, by VTI measures 3.82 cm. Pulmonic Valve: The pulmonic valve was normal in structure. Pulmonic valve regurgitation is trivial. Pulmonic regurgitation is trivial. Aorta: The aortic root, ascending aorta and aortic arch are all structurally normal, with no evidence of dilitation or obstruction. Venous: The inferior vena cava was not well visualized. IAS/Shunts: No atrial level shunt detected by color flow Doppler. There is no evidence of a patent foramen ovale. No ventricular septal defect is seen or detected. There is no evidence of an atrial septal defect.  LEFT VENTRICLE PLAX 2D LVIDd:         5.32 cm  Diastology LVIDs:         4.09 cm  LV e' lateral:   6.20 cm/s LV PW:         1.20 cm  LV E/e' lateral: 12.5 LV IVS:        0.92 cm  LV e' medial:    5.77 cm/s LVOT diam:     2.30 cm  LV E/e' medial:  13.4 LV SV:         63 ml LV SV Index:   23.63 LVOT Area:     4.15 cm  LEFT ATRIUM           Index LA diam:      5.00 cm 2.11 cm/m LA Vol (A4C): 93.3 ml 39.33 ml/m  AORTIC VALVE                    PULMONIC VALVE AV Area (Vmax):    2.75 cm     PV Vmax:       1.55 m/s AV Area (Vmean):   2.78 cm     PV Vmean:      104.000 cm/s AV Area (VTI):     3.82 cm     PV VTI:        0.424 m AV Vmax:           169.00 cm/s  PV Peak grad:  9.6 mmHg AV Vmean:           103.000 cm/s PV Mean grad:  5.0 mmHg AV VTI:            0.332 m AV Peak Grad:      11.4 mmHg AV Mean Grad:      5.0 mmHg LVOT Vmax:         112.00 cm/s LVOT Vmean:        68.900 cm/s LVOT VTI:          0.305 m LVOT/AV VTI ratio: 0.92  AORTA Ao Root diam: 3.00 cm MITRAL VALVE MV Area (PHT): 2.62 cm             SHUNTS MV Peak grad:  5.1 mmHg             Systemic VTI:  0.30 m MV Mean grad:  2.0 mmHg             Systemic Diam: 2.30 cm MV Vmax:       1.13 m/s MV Vmean:      73.5 cm/s MV VTI:        0.31 m MV PHT:  83.81 msec MV Decel Time: 289 msec MV E velocity: 77.60 cm/s 103 cm/s MV A velocity: 87.30 cm/s 70.3 cm/s MV E/A ratio:  0.89       1.5  Kathlyn Sacramento MD Electronically signed by Kathlyn Sacramento MD Signature Date/Time: 03/31/2019/11:45:06 AM    Final         Scheduled Meds: . amLODipine  5 mg Oral QPM  . dabigatran  150 mg Oral BID  . dextromethorphan-guaiFENesin  1 tablet Oral BID  . febuxostat  40 mg Oral Daily  . fesoterodine  4 mg Oral Daily  . folic acid  1 mg Oral Daily  . gabapentin  300 mg Oral TID  . hydroxychloroquine  200 mg Oral BID  . leflunomide  20 mg Oral BH-q7a  . lisinopril  2.5 mg Oral Daily  . methocarbamol  500 mg Oral TID  . metoprolol succinate  50 mg Oral QHS  . montelukast  10 mg Oral QHS  . multivitamin with minerals  1 tablet Oral Daily  . predniSONE  20 mg Oral Q breakfast  . sodium chloride flush  3 mL Intravenous Q12H  . terazosin  10 mg Oral QHS  . torsemide  20 mg Oral Daily   Continuous Infusions: . sodium chloride       LOS: 2 days    Time spent: 35 minutes    Sidney Ace, MD [facility] Pager [phone]  If 7PM-7AM, please contact night-coverage 04/01/2019, 1:27 PM

## 2019-04-02 LAB — BASIC METABOLIC PANEL
Anion gap: 11 (ref 5–15)
BUN: 21 mg/dL (ref 8–23)
CO2: 30 mmol/L (ref 22–32)
Calcium: 9.5 mg/dL (ref 8.9–10.3)
Chloride: 101 mmol/L (ref 98–111)
Creatinine, Ser: 0.71 mg/dL (ref 0.44–1.00)
GFR calc Af Amer: >60 mL/min (ref >60)
GFR calc non Af Amer: >60 mL/min (ref >60)
Glucose, Bld: 105 mg/dL — ABNORMAL HIGH (ref 70–99)
Potassium: 3.5 mmol/L (ref 3.5–5.1)
Sodium: 142 mmol/L (ref 135–145)

## 2019-04-02 LAB — SARS CORONAVIRUS 2 (TAT 6-24 HRS): SARS Coronavirus 2: NEGATIVE

## 2019-04-02 MED ORDER — POTASSIUM CHLORIDE CRYS ER 20 MEQ PO TBCR
40.0000 meq | EXTENDED_RELEASE_TABLET | Freq: Once | ORAL | Status: AC
  Administered 2019-04-02: 40 meq via ORAL
  Filled 2019-04-02: qty 2

## 2019-04-02 MED ORDER — LISINOPRIL 5 MG PO TABS
5.0000 mg | ORAL_TABLET | Freq: Every day | ORAL | Status: DC
  Administered 2019-04-02 – 2019-04-04 (×3): 5 mg via ORAL
  Filled 2019-04-02 (×2): qty 1

## 2019-04-02 NOTE — TOC Progression Note (Signed)
Transition of Care [facility]) - Progression Note    Patient Details  Name: Tina Pacheco MRN: [id number] Date of Birth: May 27, 1950  Transition of Care [facility]) CM/SW [place], RN Phone Number: 04/02/2019, 2:15 PM  Clinical Narrative:     Patient is not medically ready for discharge today, she still have bed offer at [facility].  Spoke with daughter today, she has a concern and is interested in a lift and a transfer bench for tub shower. Patient has rolling walker at home but has had increased falling at home.  Will continue to follow patient.    Expected Discharge Plan: [place] Barriers to Discharge: Continued Medical Work up  Expected Discharge Plan and Services Expected Discharge Plan: [place] Choice: [place] arrangements for the past 2 months: Single Family Home                 DME Arranged: N/A DME Agency: NA       HH Arranged: NA HH Agency: NA         Social Determinants of Health (SDOH) Interventions    Readmission Risk Interventions No flowsheet data found.

## 2019-04-02 NOTE — TOC Progression Note (Signed)
Transition of Care [facility]) - Progression Note    Patient Details  Name: Tina Pacheco MRN: [id number] Date of Birth: 01/03/1951  Transition of Care [facility]) CM/SW [place], RN Phone Number: 04/02/2019, 2:20 PM  Clinical Narrative:     Damaris Schooner with Ler[facility] Sea at [place], he will contact daughter to discuss lift and transfer bench for patient.   Expected Discharge Plan: [place] Barriers to Discharge: Continued Medical Work up  Expected Discharge Plan and Services Expected Discharge Plan: [place] Choice: [place] arrangements for the past 2 months: Single Family Home                 DME Arranged: N/A DME Agency: NA       HH Arranged: NA HH Agency: NA         Social Determinants of Health (SDOH) Interventions    Readmission Risk Interventions No flowsheet data found.

## 2019-04-02 NOTE — Plan of Care (Signed)
  Problem: Pain Managment: Goal: General experience of comfort will improve Outcome: Progressing   

## 2019-04-02 NOTE — Care Management Important Message (Signed)
Important Message  Patient Details  Name: Tina Pacheco MRN: [id number] Date of Birth: 1950-11-23   Medicare Important Message Given:  Yes     Dannette Barbara 04/02/2019, 11:07 AM

## 2019-04-02 NOTE — Progress Notes (Signed)
Physical Therapy Treatment Patient Details Name: Tina Pacheco MRN: [id number] DOB: 1951/02/19 Today's Date: 04/02/2019    History of Present Illness per MD note: Tina Pacheco is a 69 y.o. female with medical history significant of hypertension, hyperlipidemia, COPD, asthma, gout, rheumatoid arthritis, dCHF, CAD, who presents with generalized weakness, diffused joint pain, fall, shortness breath. Of note, pt describes recent falls as sliding down d/t joints giving way more so than actual fall.    PT Comments    Patient received in bed, room dark, reports she did not sleep much last night either. Agrees to PT session. Reports pain generally all over, more specifically in right shoulder. Patient requires min assist with bed exercises, supervision with mod independence for supine to sit. Require min assist for sit to supine. Sit to stand with supervision. Patient transferred to [facility] and back to bed. She will continue to benefit from skilled PT while here to improve strength and functional mobility.        Follow Up Recommendations  SNF     Equipment Recommendations  None recommended by PT    Recommendations for Other Services       Precautions / Restrictions Precautions Precautions: Fall Restrictions Weight Bearing Restrictions: No    Mobility  Bed Mobility Overal bed mobility: Needs Assistance Bed Mobility: Supine to Sit;Sit to Supine     Supine to sit: Supervision Sit to supine: Min assist   General bed mobility comments: assist needed to bring LEs back up onto bed, sleeps in recliner at home  Transfers Overall transfer level: Needs assistance Equipment used: None Transfers: Sit to/from [employer] Transfers Sit to Stand: Supervision Stand pivot transfers: Min guard          Ambulation/Gait             General Gait Details: patient did not ambulate   Stairs             Wheelchair Mobility    Modified Rankin (Stroke Patients Only)        Balance Overall balance assessment: Needs assistance Sitting-balance support: Feet supported Sitting balance-Leahy Scale: Good     Standing balance support: Single extremity supported;During functional activity Standing balance-Leahy Scale: Fair Standing balance comment: requires at least single UE support for pivot transfer and sit to stand                            Cognition Arousal/Alertness: Awake/alert Behavior During Therapy: WFL for tasks assessed/performed Overall Cognitive Status: Within Functional Limits for tasks assessed                                        Exercises Other Exercises Other Exercises: B Supine LE exercises: ap, heel slides, hip abd/add x 10 reps each, seated LAQ, marching x 10 reps Other Exercises: STS x 5 reps    General Comments        Pertinent Vitals/Pain Pain Assessment: 0-10 Pain Score: 8  Pain Location: Right shoulder, but reports pain generally everywhere. Pain Descriptors / Indicators: Aching;Discomfort;Sore Pain Intervention(s): Monitored during session;RN gave pain meds during session;Repositioned    Home Living                      Prior Function            PT Goals (current goals can  now be found in the care plan section) Acute Rehab PT Goals Patient Stated Goal: to move better, decrease pain PT Goal Formulation: With patient Time For Goal Achievement: 04/13/19 Potential to Achieve Goals: Fair Progress towards PT goals: Progressing toward goals    Frequency    Min 2X/week      PT Plan Current plan remains appropriate    Co-evaluation              AM-PAC PT "6 Clicks" Mobility   Outcome Measure  Help needed turning from your back to your side while in a flat bed without using bedrails?: A Little Help needed moving from lying on your back to sitting on the side of a flat bed without using bedrails?: A Lot Help needed moving to and from a bed to a chair (including  a wheelchair)?: A Little Help needed standing up from a chair using your arms (e.g., wheelchair or bedside chair)?: A Little Help needed to walk in hospital room?: A Lot Help needed climbing 3-5 steps with a railing? : Total 6 Click Score: 14    End of Session   Activity Tolerance: Patient tolerated treatment well Patient left: in bed;with call bell/phone within reach Nurse Communication: Mobility status PT Visit Diagnosis: Other abnormalities of gait and mobility (R26.89);Repeated falls (R29.6);Pain;Difficulty in walking, not elsewhere classified (R26.2);Muscle weakness (generalized) (M62.81) Pain - Right/Left: (all over, more specifically right shoulder) Pain - part of body: Shoulder     Time: [id number] PT Time Calculation (min) (ACUTE ONLY): 47 min  Charges:  $Therapeutic Exercise: 8-22 mins $Therapeutic Activity: 8-22 mins                     Daylin Gruszka, PT, GCS 04/02/19,10:35 AM

## 2019-04-02 NOTE — Progress Notes (Signed)
PROGRESS NOTE    Tina Pacheco  [id number] DOB: Sep 02, 1950 DOA: 03/30/2019 PCP: Venia Carbon, MD    Brief Narrative:  Tina Pacheco is a 69 y.o. female with medical history significant of hypertension, hyperlipidemia, COPD, asthma, gout, rheumatoid arthritis, peripheral Pradaxa, dCHF, CAD, who presents with generalized weakness, diffused joint pain, fall, shortness breath.  Patient states that she she has been feeling weak recently, which has been progressively worsening. She fell at least 3 times, without any injury.  She states that she just slided onto the floor.  Strongly denies any head or neck injury.  Refused CT scan for head and neck.  She does not have unilateral numbness or tingling symptoms this.  No facial droop or slurred speech.  She has history of rheumatoid arthritis. She states that her joint pain has been worsening recently. She has worsening pain in multiple joints, including both hands and both knees, both ankles.  She also reports mild shortness of breath, mild dry cough, no chest pain, fever or chills.  Denies nausea, vomiting, diarrhea, abdominal pain, symptoms of UTI.  2/1: Patient seen and examined.  On room air.  Diuresed effectively.  Clinical presentation is more consistent with rheumatoid arthritis exacerbation rather than acute decompensated heart failure.  Patient endorses severe weakness and inability to perform activities of daily living.  Worsening pain in multiple joints bilaterally.  2/2: Patient seen and examined.  Remains on room air.  Seen by rheumatology yesterday.  Greatly appreciate consultation.  Appears to be responding to the prednisone therapy however continues to endorse severe pain and weakness bilaterally in both large and small joints.  Also continues to endorse back pain.  2/3: unable to sleep well last night. Joints less achy today. remains weak   Assessment & Plan:   Principal Problem:   Acute on chronic diastolic CHF  (congestive heart failure) (HCC) Active Problems:   HLD (hyperlipidemia)   HTN (hypertension)   Rheumatoid arthritis (HCC)   Gout   Coronary artery disease, non-occlusive   PAF (paroxysmal atrial fibrillation) (HCC)   Generalized weakness   Fall   COPD (chronic obstructive pulmonary disease) (HCC)   Elevated CK   Pressure injury of skin  Rheumatoid arthritis, suspect acute flare:  Patient has a worsening diffusely joint pain but improving ESR 56, CRP 20.8 Suspect that RA flare is the primary driver for symptoms leading up to patient's admission -Continue prednisone 20 mg daily (not chronically taken, started on admission) -Continue home Plaquenil, leflunomide -Rheumatology consult greatly appreciated -Recommend trial of burst dose prednisone, with plans to initiate long-term taper on discharge -May also be an element of osteoarthritis that is contributing to the complaints -Trial of Robaxin 500 mg p.o. 3 times daily -PT and OT consults, recommending skilled nursing facility -If pain improved plan on discharge to skilled nursing facility for rehabilitation with follow-up with established rheumatologist -If pain persists despite interventions consider advanced imaging of the spine for evaluation of spinal stenosis  Acute on chronic diastolic CHF (congestive heart failure) ([place]):  2D echo on 08/10/2014 showed EF 60-65%.  Patient improving over interval, now on RA Do not suspect over HF flare Plan: Continue home torsemide 10m daily -2d echo: EF 55 to 60% with no wall motion abnormalities -Daily weights -strict I/O's -Low salt diet -Fluid restriction  Hx of gout: -Uloric  Fall and generalized weakness Most likely due to multifactorial etiology, including diffused joint pain, CHF exacerbation.  No focal neurologic deficits on physical examination -PT/OT, recommending  skilled nursing facility - has bed available   HLD (hyperlipidemia) -lipitor, on hold due to elevated  CK  HTN:  -Continue home medications: Metoprolol, amlodipine, -hydralazine prn  Coronary artery disease, non-occlusive: no CP Continue beta-blocker Statin on hold due to elevated CK  PAF (paroxysmal atrial fibrillation) ([place]): CHA2DS2-VASc Score is 5, needs oral anticoagulation. Patient is on pradaxa at home. Heart rate is well controlled. -continue Pradaxa, metoprolol  COPD (chronic obstructive pulmonary disease) and asthma: -Continue Singulair, bronchodilators  Elevated CK: CK 307, likelu due to fall.  CK improving Statin on hold   DVT prophylaxis: Pradaxa Code Status: Full Family Communication: None today Disposition Plan: Skilled nursing facility, pending improvement in pain control  Consultants:   Rheumatology-Dr. Jefm Bryant  Procedures:   None  Antimicrobials:   None   Subjective: Seen and examined Per nursing patient's mobility is improved since admission Patient continues to endorse joint pain and weakness  Objective: Vitals:   04/02/19 0737 04/02/19 1101 04/02/19 1538 04/02/19 2002  BP: (!) 142/65 (!) 139/54 138/86 137/64  Pulse: 66 64 67 61  Resp: '20 20  20  ' Temp: 98 F (36.7 C) 97.7 F (36.5 C)  97.7 F (36.5 C)  TempSrc: Oral Oral  Oral  SpO2: 91% 95% 94% 96%  Weight:      Height:        Intake/Output Summary (Last 24 hours) at 04/02/2019 2008 Last data filed at 04/02/2019 1741 Gross per 24 hour  Intake 960 ml  Output 1100 ml  Net -140 ml   Filed Weights   03/31/19 0602 04/01/19 0610 04/02/19 0454  Weight: (!) 146.8 kg (!) 143.7 kg (!) 144.9 kg    Examination:  General exam: Appears calm and comfortable Respiratory system: Mild bibasilar crackles.  Normal work of breathing Cardiovascular system: S1-S2 heard, RRR, no murmurs, no pedal edema  gastrointestinal system: Obese, NT/ND.  Positive bowel sounds  Central nervous system: Alert and oriented. No focal neurological deficits. Extremities: Diffusely decreased strength Skin: No  rashes, lesions or ulcers Psychiatry: Judgement and insight appear normal. Mood & affect appropriate.     Data Reviewed: I have personally reviewed following labs and imaging studies  CBC: Recent Labs  Lab 03/30/19 0207  WBC 9.7  NEUTROABS 7.4  HGB 11.9*  HCT 37.0  MCV 85.6  PLT 545*   Basic Metabolic Panel: Recent Labs  Lab 03/30/19 0207 03/31/19 0548 04/01/19 0642 04/02/19 0741  NA 132* 138 140 142  K 3.5 4.3 3.7 3.5  CL 98 101 101 101  CO2 '23 29 27 30  ' GLUCOSE 114* 108* 104* 105*  BUN '15 19 23 21  ' CREATININE 0.77 0.67 0.67 0.71  CALCIUM 9.4 9.8 9.6 9.5   GFR: Estimated Creatinine Clearance: 93.7 mL/min (by C-G formula based on SCr of 0.71 mg/dL). Liver Function Tests: Recent Labs  Lab 03/30/19 0207  AST 42*  ALT 21  ALKPHOS 84  BILITOT 2.5*  PROT 6.9  ALBUMIN 3.2*   No results for input(s): LIPASE, AMYLASE in the last 168 hours. No results for input(s): AMMONIA in the last 168 hours. Coagulation Profile: No results for input(s): INR, PROTIME in the last 168 hours. Cardiac Enzymes: Recent Labs  Lab 03/30/19 0157 03/31/19 0548  CKTOTAL 307* 44   BNP (last 3 results) No results for input(s): PROBNP in the last 8760 hours. HbA1C: No results for input(s): HGBA1C in the last 72 hours. CBG: No results for input(s): GLUCAP in the last 168 hours. Lipid Profile: No results  for input(s): CHOL, HDL, LDLCALC, TRIG, CHOLHDL, LDLDIRECT in the last 72 hours. Thyroid Function Tests: No results for input(s): TSH, T4TOTAL, FREET4, T3FREE, THYROIDAB in the last 72 hours. Anemia Panel: No results for input(s): VITAMINB12, FOLATE, FERRITIN, TIBC, IRON, RETICCTPCT in the last 72 hours. Sepsis Labs: Recent Labs  Lab 03/30/19 0157 03/30/19 0400  LATICACIDVEN 1.4 1.0    Recent Results (from the past 240 hour(s))  Culture, blood (routine x 2)     Status: None (Preliminary result)   Collection Time: 03/30/19  1:57 AM   Specimen: BLOOD  Result Value Ref Range  Status   Specimen Description BLOOD LEFT HAND  Final   Special Requests   Final    BOTTLES DRAWN AEROBIC AND ANAEROBIC Blood Culture adequate volume   Culture   Final    NO GROWTH 3 DAYS Performed at [facility], [address] [place], [place] [address]    Report Status PENDING  Incomplete  Urine culture     Status: None   Collection Time: 03/30/19  1:57 AM   Specimen: Urine, Random  Result Value Ref Range Status   Specimen Description   Final    URINE, RANDOM Performed at [facility], [address] [place], [place] [address]    Special Requests   Final    NONE Performed at [facility], [address], [place], [place] [address]    Culture   Final    NO GROWTH Performed at [facility], [place] [address], [place], [place] [address]    Report Status 03/31/2019 FINAL  Final  SARS CORONAVIRUS 2 (TAT 6-24 HRS) Nasopharyngeal Nasopharyngeal Swab     Status: None   Collection Time: 03/30/19  1:58 AM   Specimen: Nasopharyngeal Swab  Result Value Ref Range Status   SARS Coronavirus 2 NEGATIVE NEGATIVE Final    Comment: (NOTE) SARS-CoV-2 target nucleic acids are NOT DETECTED. The SARS-CoV-2 RNA is generally detectable in upper and lower respiratory specimens during the acute phase of infection. Negative results do not preclude SARS-CoV-2 infection, do not rule out co-infections with other pathogens, and should not be used as the sole basis for treatment or other patient management decisions. Negative results must be combined with clinical observations, patient history, and epidemiological information. The expected result is Negative. Fact Sheet for Patients: SugarRoll.be Fact Sheet for Healthcare Providers: https://www.woods-mathews.com/ This test is not yet approved or cleared by the [place] FDA and  has been authorized for detection and/or diagnosis of SARS-CoV-2 by FDA under an Emergency Use  Authorization (EUA). This EUA will remain  in effect (meaning this test can be used) for the duration of the COVID-19 declaration under Section 56 4(b)(1) of the Act, 21 U.S.C. section 360bbb-3(b)(1), unless the authorization is terminated or revoked sooner. Performed at [facility], [place] [address], [place], [place] [address]   Culture, blood (routine x 2)     Status: None (Preliminary result)   Collection Time: 03/30/19  2:06 AM   Specimen: Left Antecubital; Blood  Result Value Ref Range Status   Specimen Description LEFT ANTECUBITAL  Final   Special Requests   Final    Blood Culture results may not be optimal due to an excessive volume of blood received in culture bottles   Culture   Final    NO GROWTH 3 DAYS Performed at [facility], [address] [place], [place] [address]    Report Status PENDING  Incomplete  SARS CORONAVIRUS  2 (TAT 6-24 HRS) Nasopharyngeal Nasopharyngeal Swab     Status: None   Collection Time: 04/01/19  5:27 PM   Specimen: Nasopharyngeal Swab  Result Value Ref Range Status   SARS Coronavirus 2 NEGATIVE NEGATIVE Final    Comment: (NOTE) SARS-CoV-2 target nucleic acids are NOT DETECTED. The SARS-CoV-2 RNA is generally detectable in upper and lower respiratory specimens during the acute phase of infection. Negative results do not preclude SARS-CoV-2 infection, do not rule out co-infections with other pathogens, and should not be used as the sole basis for treatment or other patient management decisions. Negative results must be combined with clinical observations, patient history, and epidemiological information. The expected result is Negative. Fact Sheet for Patients: SugarRoll.be Fact Sheet for Healthcare Providers: https://www.woods-mathews.com/ This test is not yet approved or cleared by the [place] FDA and  has been authorized for detection and/or diagnosis of SARS-CoV-2 by FDA under  an Emergency Use Authorization (EUA). This EUA will remain  in effect (meaning this test can be used) for the duration of the COVID-19 declaration under Section 56 4(b)(1) of the Act, 21 U.S.C. section 360bbb-3(b)(1), unless the authorization is terminated or revoked sooner. Performed at [facility], [place] [address] [place], [place] [address]          Radiology Studies: No results found.      Scheduled Meds: . amLODipine  5 mg Oral QPM  . dabigatran  150 mg Oral BID  . dextromethorphan-guaiFENesin  1 tablet Oral BID  . febuxostat  40 mg Oral Daily  . feeding supplement (ENSURE ENLIVE)  237 mL Oral BID BM  . fesoterodine  4 mg Oral Daily  . folic acid  1 mg Oral Daily  . gabapentin  300 mg Oral TID  . hydroxychloroquine  200 mg Oral BID  . leflunomide  20 mg Oral BH-q7a  . lisinopril  5 mg Oral Daily  . methocarbamol  500 mg Oral TID  . metoprolol succinate  50 mg Oral QHS  . montelukast  10 mg Oral QHS  . multivitamin with minerals  1 tablet Oral Daily  . predniSONE  20 mg Oral Q breakfast  . sodium chloride flush  3 mL Intravenous Q12H  . terazosin  10 mg Oral QHS  . torsemide  20 mg Oral Daily   Continuous Infusions: . sodium chloride       LOS: 3 days    Time spent: 35 minutes    Max Sane, MD [facility]  If 7PM-7AM, please contact night-coverage 04/02/2019, 8:08 PM

## 2019-04-03 DIAGNOSIS — I2510 Atherosclerotic heart disease of native coronary artery without angina pectoris: Secondary | ICD-10-CM

## 2019-04-03 LAB — BASIC METABOLIC PANEL
Anion gap: 8 (ref 5–15)
BUN: 24 mg/dL — ABNORMAL HIGH (ref 8–23)
CO2: 30 mmol/L (ref 22–32)
Calcium: 9.6 mg/dL (ref 8.9–10.3)
Chloride: 101 mmol/L (ref 98–111)
Creatinine, Ser: 0.78 mg/dL (ref 0.44–1.00)
GFR calc Af Amer: >60 mL/min (ref >60)
GFR calc non Af Amer: >60 mL/min (ref >60)
Glucose, Bld: 116 mg/dL — ABNORMAL HIGH (ref 70–99)
Potassium: 4 mmol/L (ref 3.5–5.1)
Sodium: 139 mmol/L (ref 135–145)

## 2019-04-03 LAB — SARS CORONAVIRUS 2 (TAT 6-24 HRS): SARS Coronavirus 2: NEGATIVE

## 2019-04-03 NOTE — Progress Notes (Signed)
PROGRESS NOTE    Tina Pacheco  HCW:[id number] DOB: 05-Dec-1950 DOA: 03/30/2019 PCP: Venia Carbon, MD    Brief Narrative:  Tina Pacheco is a 69 y.o. female with medical history significant of hypertension, hyperlipidemia, COPD, asthma, gout, rheumatoid arthritis, peripheral Pradaxa, dCHF, CAD, who presents with generalized weakness, diffused joint pain, fall, shortness breath.  Patient states that she she has been feeling weak recently, which has been progressively worsening. She fell at least 3 times, without any injury.  She states that she just slided onto the floor.  Strongly denies any head or neck injury.  Refused CT scan for head and neck.  She does not have unilateral numbness or tingling symptoms this.  No facial droop or slurred speech.  She has history of rheumatoid arthritis. She states that her joint pain has been worsening recently. She has worsening pain in multiple joints, including both hands and both knees, both ankles.  She also reports mild shortness of breath, mild dry cough, no chest pain, fever or chills.  Denies nausea, vomiting, diarrhea, abdominal pain, symptoms of UTI.  2/1: Patient seen and examined.  On room air.  Diuresed effectively.  Clinical presentation is more consistent with rheumatoid arthritis exacerbation rather than acute decompensated heart failure.  Patient endorses severe weakness and inability to perform activities of daily living.  Worsening pain in multiple joints bilaterally.  2/2: Patient seen and examined.  Remains on room air.  Seen by rheumatology yesterday.  Greatly appreciate consultation.  Appears to be responding to the prednisone therapy however continues to endorse severe pain and weakness bilaterally in both large and small joints.  Also continues to endorse back pain.  2/3: unable to sleep well last night. Joints less achy today. remains weak  2/4: slept better but not feeling quite well, very weak and would like to work with  therapy today before leaving   Assessment & Plan:   Principal Problem:   Acute on chronic diastolic CHF (congestive heart failure) ([place]) Active Problems:   HLD (hyperlipidemia)   HTN (hypertension)   Rheumatoid arthritis (HCC)   Gout   Coronary artery disease, non-occlusive   PAF (paroxysmal atrial fibrillation) (HCC)   Generalized weakness   Fall   COPD (chronic obstructive pulmonary disease) (HCC)   Elevated CK   Pressure injury of skin  Rheumatoid arthritis, suspect acute flare:  Patient has a worsening diffusely joint pain but improving ESR 56, CRP 20.8 Suspect that RA flare is the primary driver for symptoms leading up to patient's admission -Continue prednisone 20 mg daily (not chronically taken, started on admission) -Continue home Plaquenil, leflunomide -Rheumatology consult greatly appreciated -continue trial of burst dose prednisone, with plans to initiate long-term taper on discharge -May also be an element of osteoarthritis that is contributing to the complaints -Trial of Robaxin 500 mg p.o. 3 times daily -PT and OT recommending skilled nursing facility  Acute on chronic diastolic CHF (congestive heart failure) ([place]):  2D echo on 08/10/2014 showed EF 60-65%.  Patient improving over interval, now on RA Do not suspect over HF flare Continue home torsemide 34m daily -2d echo: EF 55 to 60% with no wall motion abnormalities -Daily weights -strict I/O's -Low salt diet -Fluid restriction  Hx of gout: -Uloric  Fall and generalized weakness Most likely due to multifactorial etiology, including diffused joint pain, CHF exacerbation.  No focal neurologic deficits on physical examination -PT/OT, recommending skilled nursing facility   HLD (hyperlipidemia) -lipitor, on hold due to elevated CK  HTN:  -Continue home medications: Metoprolol, amlodipine, -hydralazine prn  Coronary artery disease, non-occlusive: no CP Continue beta-blocker Statin on hold due  to elevated CK  PAF (paroxysmal atrial fibrillation) ([place]): CHA2DS2-VASc Score is 5, needs oral anticoagulation. Patient is on pradaxa at home. Heart rate is well controlled. -continue Pradaxa, metoprolol  COPD (chronic obstructive pulmonary disease) and asthma: -Continue Singulair, bronchodilators  Elevated CK: CK 307, likelu due to fall.  CK improving Statin on hold   DVT prophylaxis: Pradaxa Code Status: Full Family Communication: None today Disposition Plan: Skilled nursing facility tomorrow, COVID recheck neg 2/4   Consultants:   Rheumatology-Dr. Jefm Bryant  Procedures:   None  Antimicrobials:   None   Subjective: Seen and examined Per nursing patient's mobility is improved since admission Patient continues to endorse joint pain and weakness  Objective: Vitals:   04/03/19 0751 04/03/19 1222 04/03/19 1627 04/03/19 2027  BP: (!) 151/65 131/73 (!) 107/56 131/63  Pulse: 60 (!) 54 62 (!) 54  Resp: '16 18 16 16  ' Temp: 97.6 F (36.4 C) 97.9 F (36.6 C) 97.7 F (36.5 C) 97.8 F (36.6 C)  TempSrc: Oral Oral Oral Oral  SpO2: 95% 94% 91% 95%  Weight:      Height:        Intake/Output Summary (Last 24 hours) at 04/03/2019 2050 Last data filed at 04/03/2019 2027 Gross per 24 hour  Intake 840 ml  Output 1200 ml  Net -360 ml   Filed Weights   04/01/19 0610 04/02/19 0454 04/03/19 0453  Weight: (!) 143.7 kg (!) 144.9 kg (!) 141.4 kg    Examination:  General exam: Appears calm and comfortable Respiratory system: Mild bibasilar crackles.  Normal work of breathing Cardiovascular system: S1-S2 heard, RRR, no murmurs, no pedal edema  gastrointestinal system: Obese, NT/ND.  Positive bowel sounds  Central nervous system: Alert and oriented. No focal neurological deficits. Extremities: Diffusely decreased strength Skin: No rashes, lesions or ulcers Psychiatry: Judgement and insight appear normal. Mood & affect appropriate.     Data Reviewed: I have personally  reviewed following labs and imaging studies  CBC: Recent Labs  Lab 03/30/19 0207  WBC 9.7  NEUTROABS 7.4  HGB 11.9*  HCT 37.0  MCV 85.6  PLT 808*   Basic Metabolic Panel: Recent Labs  Lab 03/30/19 0207 03/31/19 0548 04/01/19 0642 04/02/19 0741 04/03/19 0658  NA 132* 138 140 142 139  K 3.5 4.3 3.7 3.5 4.0  CL 98 101 101 101 101  CO2 '23 29 27 30 30  ' GLUCOSE 114* 108* 104* 105* 116*  BUN '15 19 23 21 ' 24*  CREATININE 0.77 0.67 0.67 0.71 0.78  CALCIUM 9.4 9.8 9.6 9.5 9.6   GFR: Estimated Creatinine Clearance: 92.2 mL/min (by C-G formula based on SCr of 0.78 mg/dL). Liver Function Tests: Recent Labs  Lab 03/30/19 0207  AST 42*  ALT 21  ALKPHOS 84  BILITOT 2.5*  PROT 6.9  ALBUMIN 3.2*   No results for input(s): LIPASE, AMYLASE in the last 168 hours. No results for input(s): AMMONIA in the last 168 hours. Coagulation Profile: No results for input(s): INR, PROTIME in the last 168 hours. Cardiac Enzymes: Recent Labs  Lab 03/30/19 0157 03/31/19 0548  CKTOTAL 307* 44   BNP (last 3 results) No results for input(s): PROBNP in the last 8760 hours. HbA1C: No results for input(s): HGBA1C in the last 72 hours. CBG: No results for input(s): GLUCAP in the last 168 hours. Lipid Profile: No results for input(s): CHOL,  HDL, LDLCALC, TRIG, CHOLHDL, LDLDIRECT in the last 72 hours. Thyroid Function Tests: No results for input(s): TSH, T4TOTAL, FREET4, T3FREE, THYROIDAB in the last 72 hours. Anemia Panel: No results for input(s): VITAMINB12, FOLATE, FERRITIN, TIBC, IRON, RETICCTPCT in the last 72 hours. Sepsis Labs: Recent Labs  Lab 03/30/19 0157 03/30/19 0400  LATICACIDVEN 1.4 1.0    Recent Results (from the past 240 hour(s))  Culture, blood (routine x 2)     Status: None (Preliminary result)   Collection Time: 03/30/19  1:57 AM   Specimen: BLOOD  Result Value Ref Range Status   Specimen Description BLOOD LEFT HAND  Final   Special Requests   Final    BOTTLES  DRAWN AEROBIC AND ANAEROBIC Blood Culture adequate volume   Culture   Final    NO GROWTH 4 DAYS Performed at [facility], [address], [place], [place] [address]    Report Status PENDING  Incomplete  Urine culture     Status: None   Collection Time: 03/30/19  1:57 AM   Specimen: Urine, Random  Result Value Ref Range Status   Specimen Description   Final    URINE, RANDOM Performed at [facility], [address], [place], [place] [address]    Special Requests   Final    NONE Performed at [facility], [address] [place], [place] [address]    Culture   Final    NO GROWTH Performed at [facility], [place] [address] [place], [place] [address]    Report Status 03/31/2019 FINAL  Final  SARS CORONAVIRUS 2 (TAT 6-24 HRS) Nasopharyngeal Nasopharyngeal Swab     Status: None   Collection Time: 03/30/19  1:58 AM   Specimen: Nasopharyngeal Swab  Result Value Ref Range Status   SARS Coronavirus 2 NEGATIVE NEGATIVE Final    Comment: (NOTE) SARS-CoV-2 target nucleic acids are NOT DETECTED. The SARS-CoV-2 RNA is generally detectable in upper and lower respiratory specimens during the acute phase of infection. Negative results do not preclude SARS-CoV-2 infection, do not rule out co-infections with other pathogens, and should not be used as the sole basis for treatment or other patient management decisions. Negative results must be combined with clinical observations, patient history, and epidemiological information. The expected result is Negative. Fact Sheet for Patients: SugarRoll.be Fact Sheet for Healthcare Providers: https://www.woods-mathews.com/ This test is not yet approved or cleared by the [place] FDA and  has been authorized for detection and/or diagnosis of SARS-CoV-2 by FDA under an Emergency Use Authorization (EUA). This EUA will remain  in effect (meaning this test can be used) for the  duration of the COVID-19 declaration under Section 56 4(b)(1) of the Act, 21 U.S.C. section 360bbb-3(b)(1), unless the authorization is terminated or revoked sooner. Performed at [facility], [place] [address], [place], [place] [address]   Culture, blood (routine x 2)     Status: None (Preliminary result)   Collection Time: 03/30/19  2:06 AM   Specimen: Left Antecubital; Blood  Result Value Ref Range Status   Specimen Description LEFT ANTECUBITAL  Final   Special Requests   Final    Blood Culture results may not be optimal due to an excessive volume of blood received in culture bottles   Culture   Final    NO GROWTH 4 DAYS Performed at [facility], [address], [place], [name] [address]    Report Status PENDING  Incomplete  SARS CORONAVIRUS 2 (TAT 6-24  HRS) Nasopharyngeal Nasopharyngeal Swab     Status: None   Collection Time: 04/01/19  5:27 PM   Specimen: Nasopharyngeal Swab  Result Value Ref Range Status   SARS Coronavirus 2 NEGATIVE NEGATIVE Final    Comment: (NOTE) SARS-CoV-2 target nucleic acids are NOT DETECTED. The SARS-CoV-2 RNA is generally detectable in upper and lower respiratory specimens during the acute phase of infection. Negative results do not preclude SARS-CoV-2 infection, do not rule out co-infections with other pathogens, and should not be used as the sole basis for treatment or other patient management decisions. Negative results must be combined with clinical observations, patient history, and epidemiological information. The expected result is Negative. Fact Sheet for Patients: SugarRoll.be Fact Sheet for Healthcare Providers: https://www.woods-mathews.com/ This test is not yet approved or cleared by the [place] FDA and  has been authorized for detection and/or diagnosis of SARS-CoV-2 by FDA under an Emergency Use Authorization (EUA). This EUA will remain  in effect (meaning this test can  be used) for the duration of the COVID-19 declaration under Section 56 4(b)(1) of the Act, 21 U.S.C. section 360bbb-3(b)(1), unless the authorization is terminated or revoked sooner. Performed at [facility], [place] [address] [place] [address], [place], [place] [address]   SARS CORONAVIRUS 2 (TAT 6-24 HRS) Nasopharyngeal Nasopharyngeal Swab     Status: None   Collection Time: 04/03/19  9:28 AM   Specimen: Nasopharyngeal Swab  Result Value Ref Range Status   SARS Coronavirus 2 NEGATIVE NEGATIVE Final    Comment: (NOTE) SARS-CoV-2 target nucleic acids are NOT DETECTED. The SARS-CoV-2 RNA is generally detectable in upper and lower respiratory specimens during the acute phase of infection. Negative results do not preclude SARS-CoV-2 infection, do not rule out co-infections with other pathogens, and should not be used as the sole basis for treatment or other patient management decisions. Negative results must be combined with clinical observations, patient history, and epidemiological information. The expected result is Negative. Fact Sheet for Patients: SugarRoll.be Fact Sheet for Healthcare Providers: https://www.woods-mathews.com/ This test is not yet approved or cleared by the [place] FDA and  has been authorized for detection and/or diagnosis of SARS-CoV-2 by FDA under an Emergency Use Authorization (EUA). This EUA will remain  in effect (meaning this test can be used) for the duration of the COVID-19 declaration under Section 56 4(b)(1) of the Act, 21 U.S.C. section 360bbb-3(b)(1), unless the authorization is terminated or revoked sooner. Performed at [facility], [place] [address] [place], [place] [address]          Radiology Studies: No results found.      Scheduled Meds: . amLODipine  5 mg Oral QPM  . dabigatran  150 mg Oral BID  . dextromethorphan-guaiFENesin  1 tablet Oral BID  . febuxostat  40 mg Oral Daily  . feeding  supplement (ENSURE ENLIVE)  237 mL Oral BID BM  . fesoterodine  4 mg Oral Daily  . folic acid  1 mg Oral Daily  . gabapentin  300 mg Oral TID  . hydroxychloroquine  200 mg Oral BID  . leflunomide  20 mg Oral BH-q7a  . lisinopril  5 mg Oral Daily  . methocarbamol  500 mg Oral TID  . metoprolol succinate  50 mg Oral QHS  . montelukast  10 mg Oral QHS  . multivitamin with minerals  1 tablet Oral Daily  . predniSONE  20 mg Oral Q breakfast  . sodium chloride flush  3 mL Intravenous Q12H  .  terazosin  10 mg Oral QHS  . torsemide  20 mg Oral Daily   Continuous Infusions: . sodium chloride       LOS: 4 days    Time spent: 35 minutes    Max Sane, MD [facility]  If 7PM-7AM, please contact night-coverage 04/03/2019, 8:50 PM

## 2019-04-03 NOTE — TOC Progression Note (Signed)
Transition of Care [facility]) - Progression Note    Patient Details  Name: BLIA ALVARDO MRN: [id number] Date of Birth: 1950/07/02  Transition of Care [facility]) CM/SW [place], RN Phone Number: 04/03/2019, 9:39 AM  Clinical Narrative:     Checked bed availability at [facility], they will continue to hold bed for tomorrow, they are asking for Covid to be repeated since discharge will not be until tomorrow.     Expected Discharge Plan: [place] Barriers to Discharge: Continued Medical Work up  Expected Discharge Plan and Services Expected Discharge Plan: [place] Choice: [place] arrangements for the past 2 months: Single Family Home                 DME Arranged: N/A DME Agency: NA       HH Arranged: NA HH Agency: NA         Social Determinants of Health (SDOH) Interventions    Readmission Risk Interventions No flowsheet data found.

## 2019-04-03 NOTE — Progress Notes (Signed)
Occupational Therapy Treatment Patient Details Name: Tina Pacheco MRN: [id number] DOB: 1951/01/22 Today's Date: 04/03/2019    History of present illness per MD note: Tina Pacheco is a 69 y.o. female with medical history significant of hypertension, hyperlipidemia, COPD, asthma, gout, rheumatoid arthritis, dCHF, CAD, who presents with generalized weakness, diffused joint pain, fall, shortness breath. Of note, pt describes recent falls as sliding down d/t joints giving way more so than actual fall.   OT comments  Pt seen for OT tx this date to f/u re: safety with ADLs/ADL mobility. Pt requires CGA for ADL transfers with RW, demos G static standing balance with RW. Able to stand sink-side and perform fxl mobility, standing for 6-7 mins mins with minimal knee buckling requiring MIN A, but essentially requires only CGA for majority of standing tasks. Pt performs SPS t/f to/from [facility] with CGA and performs peri care with CGA with lateral lean technique. MIN A for standing clothing mgt. Pt returned to bed, left with call light in reach and bed alarm on. Overall, pt still with relatively low fxl activity tolerance and some unsteadiness in standing placing her at risk for falls. D/c to SNF for some short term rehab remains safest d/c disposition from OT standpoint.    Follow Up Recommendations  SNF    Equipment Recommendations  3 in 1 bedside commode;Hospital bed    Recommendations for Other Services      Precautions / Restrictions Precautions Precautions: Fall Restrictions Weight Bearing Restrictions: No       Mobility Bed Mobility Overal bed mobility: Needs Assistance Bed Mobility: Supine to Sit;Sit to Supine     Supine to sit: Supervision Sit to supine: Min assist   General bed mobility comments: MIN A for LEs to get back to bed  Transfers Overall transfer level: Needs assistance Equipment used: Rolling walker (2 wheeled) Transfers: Sit to/from [facility] Sit  to Stand: Min guard Stand pivot transfers: Min guard       General transfer comment: SPS to BSC with CGA with RW    Balance Overall balance assessment: Needs assistance Sitting-balance support: Feet supported Sitting balance-Leahy Scale: Good     Standing balance support: Single extremity supported;During functional activity Standing balance-Leahy Scale: Fair Standing balance comment: requires UE support through RW for standing balance                           ADL either performed or assessed with clinical judgement   ADL                                         General ADL Comments: Pt standing sink side with RW to brush teeth with CGA to MIN A for standing balance, but completes actual functional task with setup only. performs BSC t/f with SPS with CGA, performs peri care and clothing mgt with CGA with lateral lean technique. Performs hand hygiene with setup while seated.     Vision Patient Visual Report: No change from baseline     Perception     Praxis      Cognition Arousal/Alertness: Awake/alert Behavior During Therapy: WFL for tasks assessed/performed Overall Cognitive Status: Within Functional Limits for tasks assessed  Exercises     Shoulder Instructions       General Comments      Pertinent Vitals/ Pain       Pain Assessment: 0-10 Pain Score: 5  Pain Location: knees Pain Descriptors / Indicators: Aching;Discomfort;Sore Pain Intervention(s): Limited activity within patient's tolerance;Monitored during session  Home Living                                          Prior Functioning/Environment              Frequency  Min 1X/week        Progress Toward Goals  OT Goals(current goals can now be found in the care plan section)  Progress towards OT goals: Progressing toward goals  Acute Rehab OT Goals Patient Stated Goal: to move  better, decrease pain OT Goal Formulation: With patient Time For Goal Achievement: 04/14/19 Potential to Achieve Goals: Good  Plan Discharge plan remains appropriate;Frequency remains appropriate    Co-evaluation                 AM-PAC OT "6 Clicks" Daily Activity     Outcome Measure   Help from another person eating meals?: None Help from another person taking care of personal grooming?: A Little Help from another person toileting, which includes using toliet, bedpan, or urinal?: A Little Help from another person bathing (including washing, rinsing, drying)?: A Lot Help from another person to put on and taking off regular upper body clothing?: A Little Help from another person to put on and taking off regular lower body clothing?: A Lot 6 Click Score: 17    End of Session Equipment Utilized During Treatment: Gait belt;Rolling walker  OT Visit Diagnosis: Unsteadiness on feet (R26.81);Muscle weakness (generalized) (M62.81)   Activity Tolerance Patient tolerated treatment well   Patient Left in bed;with call bell/phone within reach;with bed alarm set   Nurse Communication Mobility status        Time: [id number] OT Time Calculation (min): 38 min  Charges: OT General Charges $OT Visit: 1 Visit OT Treatments $Self Care/Home Management : 23-37 mins $Therapeutic Activity: 8-22 mins  Gerrianne Scale, [place], OTR/L ascom [phone] 04/03/19, 5:11 PM

## 2019-04-04 ENCOUNTER — Encounter: Payer: Federal, State, Local not specified - PPO | Admitting: Internal Medicine

## 2019-04-04 DIAGNOSIS — G4730 Sleep apnea, unspecified: Secondary | ICD-10-CM | POA: Diagnosis not present

## 2019-04-04 DIAGNOSIS — M2550 Pain in unspecified joint: Secondary | ICD-10-CM | POA: Diagnosis not present

## 2019-04-04 DIAGNOSIS — I2510 Atherosclerotic heart disease of native coronary artery without angina pectoris: Secondary | ICD-10-CM | POA: Diagnosis not present

## 2019-04-04 DIAGNOSIS — I10 Essential (primary) hypertension: Secondary | ICD-10-CM | POA: Diagnosis not present

## 2019-04-04 DIAGNOSIS — I5032 Chronic diastolic (congestive) heart failure: Secondary | ICD-10-CM

## 2019-04-04 DIAGNOSIS — R2681 Unsteadiness on feet: Secondary | ICD-10-CM | POA: Diagnosis not present

## 2019-04-04 DIAGNOSIS — I5033 Acute on chronic diastolic (congestive) heart failure: Secondary | ICD-10-CM | POA: Diagnosis not present

## 2019-04-04 DIAGNOSIS — I509 Heart failure, unspecified: Secondary | ICD-10-CM | POA: Diagnosis not present

## 2019-04-04 DIAGNOSIS — W19XXXA Unspecified fall, initial encounter: Secondary | ICD-10-CM | POA: Diagnosis not present

## 2019-04-04 DIAGNOSIS — J449 Chronic obstructive pulmonary disease, unspecified: Secondary | ICD-10-CM | POA: Diagnosis not present

## 2019-04-04 DIAGNOSIS — M6281 Muscle weakness (generalized): Secondary | ICD-10-CM | POA: Diagnosis not present

## 2019-04-04 DIAGNOSIS — J45998 Other asthma: Secondary | ICD-10-CM | POA: Diagnosis not present

## 2019-04-04 DIAGNOSIS — R0602 Shortness of breath: Secondary | ICD-10-CM | POA: Diagnosis not present

## 2019-04-04 DIAGNOSIS — M069 Rheumatoid arthritis, unspecified: Secondary | ICD-10-CM | POA: Diagnosis not present

## 2019-04-04 DIAGNOSIS — E7849 Other hyperlipidemia: Secondary | ICD-10-CM | POA: Diagnosis not present

## 2019-04-04 DIAGNOSIS — E782 Mixed hyperlipidemia: Secondary | ICD-10-CM | POA: Insufficient documentation

## 2019-04-04 DIAGNOSIS — Z7401 Bed confinement status: Secondary | ICD-10-CM | POA: Diagnosis not present

## 2019-04-04 DIAGNOSIS — E785 Hyperlipidemia, unspecified: Secondary | ICD-10-CM | POA: Diagnosis not present

## 2019-04-04 DIAGNOSIS — Z23 Encounter for immunization: Secondary | ICD-10-CM | POA: Diagnosis not present

## 2019-04-04 DIAGNOSIS — I480 Paroxysmal atrial fibrillation: Secondary | ICD-10-CM | POA: Diagnosis not present

## 2019-04-04 DIAGNOSIS — R531 Weakness: Secondary | ICD-10-CM | POA: Diagnosis not present

## 2019-04-04 DIAGNOSIS — Z9181 History of falling: Secondary | ICD-10-CM | POA: Diagnosis not present

## 2019-04-04 DIAGNOSIS — R5381 Other malaise: Secondary | ICD-10-CM | POA: Diagnosis not present

## 2019-04-04 DIAGNOSIS — M109 Gout, unspecified: Secondary | ICD-10-CM | POA: Diagnosis not present

## 2019-04-04 LAB — CBC
HCT: 39.8 % (ref 36.0–46.0)
Hemoglobin: 12.5 g/dL (ref 12.0–15.0)
MCH: 27.6 pg (ref 26.0–34.0)
MCHC: 31.4 g/dL (ref 30.0–36.0)
MCV: 87.9 fL (ref 80.0–100.0)
Platelets: 275 10*3/uL (ref 150–400)
RBC: 4.53 MIL/uL (ref 3.87–5.11)
RDW: 15.6 % — ABNORMAL HIGH (ref 11.5–15.5)
WBC: 7.4 10*3/uL (ref 4.0–10.5)
nRBC: 0 % (ref 0.0–0.2)

## 2019-04-04 LAB — CULTURE, BLOOD (ROUTINE X 2)
Culture: NO GROWTH
Culture: NO GROWTH
Special Requests: ADEQUATE

## 2019-04-04 LAB — BASIC METABOLIC PANEL
Anion gap: 8 (ref 5–15)
BUN: 23 mg/dL (ref 8–23)
CO2: 29 mmol/L (ref 22–32)
Calcium: 9.6 mg/dL (ref 8.9–10.3)
Chloride: 102 mmol/L (ref 98–111)
Creatinine, Ser: 0.71 mg/dL (ref 0.44–1.00)
GFR calc Af Amer: >60 mL/min (ref >60)
GFR calc non Af Amer: >60 mL/min (ref >60)
Glucose, Bld: 101 mg/dL — ABNORMAL HIGH (ref 70–99)
Potassium: 4 mmol/L (ref 3.5–5.1)
Sodium: 139 mmol/L (ref 135–145)

## 2019-04-04 MED ORDER — PREDNISONE 10 MG PO TABS: 10.0000 mg | ORAL_TABLET | Freq: Every day | ORAL | 0 refills | Status: DC

## 2019-04-04 MED ORDER — OXYCODONE HCL 10 MG PO TABS: 10.0000 mg | ORAL_TABLET | Freq: Four times a day (QID) | ORAL | 0 refills | Status: DC | PRN

## 2019-04-04 NOTE — TOC Transition Note (Signed)
Transition of Care [facility]) - CM/SW Discharge Note   Patient Details  Name: Tina Pacheco MRN: [id number] Date of Birth: 06-30-1950  [facility]) CM/SW Contact:  Victorino Dike, RN Phone Number: 04/04/2019, 8:45 AM   Clinical Narrative:     Left message with daughter Tina Pacheco last evening, her mother to transport to [facility] today for Short term rehab.  Discharge packet completed and given to nurse Joy.  Joy to call report at 930 when bed is available.  EMS called and told patient can transport at 930. No further TOC needs at this time, please re-consult for new needs.     Final next level of care: Skilled Nursing Facility Barriers to Discharge: Barriers Resolved   Patient Goals and CMS Choice Patient states their goals for this hospitalization and ongoing recovery are:: I want to get strong enough to get back home.   Choice offered to / list presented to : Patient  Discharge Placement              Patient chooses bed at: [facility] Patient to be transferred to facility by: [facility] Name of family member notified: Tina Pacheco Daughter Patient and family notified of of transfer: 04/03/19  Discharge Plan and Services     Post Acute Care Choice: [place]          DME Arranged: N/A DME Agency: NA       HH Arranged: NA HH Agency: NA        Social Determinants of Health (SDOH) Interventions     Readmission Risk Interventions No flowsheet data found.

## 2019-04-04 NOTE — Discharge Instructions (Signed)
Rheumatoid Arthritis Rheumatoid arthritis (RA) is a long-term (chronic) disease. RA causes inflammation in your joints. Your joints may feel painful, stiff, swollen, and warm. RA may start slowly. It most often affects the small joints of the hands and feet. It can also affect other parts of the body. Symptoms of RA often come and go. There is no cure for RA, but medicines can help your symptoms. What are the causes?  RA is an autoimmune disease. This means that your body's defense system (immune system) attacks healthy parts of your body by mistake. The exact cause of RA is not known. What increases the risk?  Being a woman.  Having a family history of RA or other diseases like RA.  Smoking.  Being overweight.  Being exposed to pollutants or chemicals. What are the signs or symptoms?  Morning stiffness that lasts longer than 30 minutes. This is often the first symptom.  Symptoms start slowly. They are often worse in the morning.  As RA gets worse, symptoms may include: ? Pain, stiffness, swelling, warmth, and tenderness in joints on both sides of your body. ? Loss of energy. ? Not feeling hungry. ? Weight loss. ? A low fever. ? Dry eyes and a dry mouth. ? Firm lumps that grow under your skin. ? Changes in the way your joints look. ? Changes in the way your joints work.  Symptoms vary and they: ? Often come and go. ? Sometimes get worse for a period of time. These are called flares. How is this treated?   Treatment may include: ? Taking good care of yourself. Be sure to rest as needed, eat a healthy diet, and exercise. ? Medicines. These may include:  Pain relievers.  Medicines to help with inflammation.  Disease-modifying antirheumatic drugs (DMARDs).  Medicines called biologic response modifiers. ? Physical therapy and occupational therapy. ? Surgery, if joint damage is very bad. Your doctor will work with you to find the best treatments. Follow these  instructions at home: Activity  Return to your normal activities as told by your doctor. Ask your doctor what activities are safe for you.  Rest when you have a flare.  Exercise as told by your doctor. General instructions  Take over-the-counter and prescription medicines only as told by your doctor.  Keep all follow-up visits as told by your doctor. This is important. Where to find more information  American College of Rheumatology: www.rheumatology.org  Arthritis Foundation: www.arthritis.org Contact a doctor if:  You have a flare.  You have a fever.  You have problems because of your medicines. Get help right away if:  You have chest pain.  You have trouble breathing.  You get a hot, painful joint all of a sudden, and it is worse than your normal joint aches. Summary  RA is a long-term disease.  Symptoms of RA start slowly. They are often worse in the morning.  RA causes inflammation in your joints. This information is not intended to replace advice given to you by your health care provider. Make sure you discuss any questions you have with your health care provider. Document Revised: 10/17/2017 Document Reviewed: 10/17/2017 Elsevier Patient Education  2020 Elsevier Inc.  

## 2019-04-04 NOTE — Progress Notes (Signed)
PT Cancellation Note  Patient Details Name: Tina Pacheco MRN: [id number] DOB: 11-15-1950   Cancelled Treatment:    Reason Eval/Treat Not Completed: Other (comment)   Offered and encouraged session.  Pt awaiting breakfast and transfer to SNF.  Declined session as she stated she anticipates "a day" today.   Chesley Noon 04/04/2019, 9:09 AM

## 2019-04-04 NOTE — Discharge Summary (Signed)
[facility] [place] NAME: Tina Pacheco    MR#:  [id number]  DATE OF BIRTH:  18-Apr-1950  DATE OF ADMISSION:  03/30/2019   ADMITTING PHYSICIAN: Ivor Costa, MD  DATE OF DISCHARGE: 04/04/2019  PRIMARY CARE PHYSICIAN: Venia Carbon, MD   ADMISSION DIAGNOSIS:  Acute CHF (congestive heart failure) (HCC) [I50.9] Generalized weakness [R53.1] Acute on chronic diastolic CHF (congestive heart failure) ([place]) [I50.33] Fall, initial encounter [W19.XXXA] Acute on chronic congestive heart failure, unspecified heart failure type ([place]) [I50.9] DISCHARGE DIAGNOSIS:  Principal Problem:   Acute on chronic diastolic CHF (congestive heart failure) (HCC) Active Problems:   HLD (hyperlipidemia)   HTN (hypertension)   Rheumatoid arthritis (HCC)   Gout   Coronary artery disease, non-occlusive   PAF (paroxysmal atrial fibrillation) (HCC)   Generalized weakness   Fall   COPD (chronic obstructive pulmonary disease) (HCC)   Elevated CK   Pressure injury of skin   Acute on chronic congestive heart failure ([place])  SECONDARY DIAGNOSIS:   Past Medical History:  Diagnosis Date  . Acute CHF (congestive heart failure) ([place]) 03/30/2019  . Arthritis    RA  . Asthma   . Basal cell carcinoma   . Cataract 2018   bilateral eyes; corrected with surgery  . Claustrophobia   . Collagen vascular disease (HCC)    RA  . COPD (chronic obstructive pulmonary disease) ([place])   . Diastolic dysfunction    a. echo 07/2014: EF 55-60%, no RWMA, GR2DD, mild MR, LA moderately dilated, PASP 38 mm Hg  . Dyslipidemia   . Dysrhythmia   . Headache    migraines  . Hemihypertrophy   . History of cardiac cath    a. 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 History of gout   . Hyperplastic colonic polyp 2003  . Hypertension   . Hypokalemia   . Morbid obesity ([place])   . PAF (paroxysmal atrial fibrillation) (HCC)    a. on Pradaxa; b. CHADSVASc at least 2 (HTN & female)  . Rheumatoid  arthritis(714.0)   . Sleep apnea    a. not compliant with CPAP   HOSPITAL COURSE:  Tina Pacheco a 70 y.o.femalewith medical history significant ofhypertension, hyperlipidemia, COPD, asthma, gout, rheumatoid arthritis, peripheral Pradaxa,dCHF, CAD, admitted with generalized weakness, diffusedjoint pain, fall, shortness breath.  Patient states that she she has been feeling weak recently, which has been progressively worsening.She fellat least 3 times, without any injury. She states that she justslidedonto the floor.Strongly denies any head or neck injury. Refused CT scanfor head and neck. She does not have unilateral numbness or tingling symptoms this. No facial droop or slurred speech. She has history of rheumatoid arthritis. She states that her joint pain has been worseningrecently.She has worsening pain in multiple joints, including both hands andboth knees, both ankles. She also reports mild shortness of breath, mild dry cough, no chest pain, fever or chills.   Rheumatoid arthritis - acute flare: Patient has a worsening diffusely joint pain but improving ESR 56, CRP 20.8 - RA flare is the primary driver for symptoms leading up to patient's admission -Discharging on Prednisone 10 mg daily for 2 weeks and f/up with Rheum -Continue home Plaquenil, leflunomide -May also be an element of osteoarthritis that is contributing to the complaints -PT and OT recommending skilled nursing facility where she is being discharged  Acute on chronic diastolic CHF (congestive heart failure) ([place]): -2d echo: EF 55 to 60% with no wall motion abnormalities, well  compensated at this time  Hx of gout: -Uloric  Falland generalized weakness Most likely due to multifactorial etiology, including diffused joint pain, CHF exacerbation.No focal neurologic deficits on physical examination -PT/OT, recommending skilled nursing facility   HLD (hyperlipidemia) -lipitor  HTN:    -Continue home medications:Metoprolol, amlodipine, -hydralazine prn  Coronary artery disease, non-occlusive:no CP Continue beta-blocker Statin on hold while in the hospital due to elevated CK, resume at discharge  PAF (paroxysmal atrial fibrillation) (HCC):CHA2DS2-VASc Scoreis 5, needs oral anticoagulation. Patient is onpradaxa at home. Heart rate is well controlled. -continuePradaxa, metoprolol  COPD (chronic obstructive pulmonary disease)and asthma: -Continue Singulair, bronchodilators  Elevated JG:OTLXBW due to fall.  CK normalized Statin on hold while in the hospital.  Resume at discharge DISCHARGE CONDITIONS:  stable CONSULTS OBTAINED:  Treatment Team:  Emmaline Kluver., MD DRUG ALLERGIES:   Allergies  Allergen Reactions  . Codeine     Nausea and vomiting   DISCHARGE MEDICATIONS:   Allergies as of 04/04/2019      Reactions   Codeine    Nausea and vomiting      Medication List    TAKE these medications   amLODipine 5 MG tablet Commonly known as: NORVASC Take 5 mgs by mouth once daily in the evening   atorvastatin 20 MG tablet Commonly known as: LIPITOR TAKE 1 TABLET DAILY AT 6PM   dabigatran 150 MG Caps capsule Commonly known as: Pradaxa Take 1 capsule (150 mg total) by mouth 2 (two) times daily.   diphenhydrAMINE 25 MG tablet Commonly known as: BENADRYL Take 25 mg by mouth daily as needed for allergies.   folic acid 1 MG tablet Commonly known as: FOLVITE Take 1 mg by mouth daily.   gabapentin 300 MG capsule Commonly known as: NEURONTIN Take 1 capsule 3 times a day (Take 1 capsule 2-3 hours before bedtime)   hydroxychloroquine 200 MG tablet Commonly known as: PLAQUENIL Take 200 mg by mouth 2 (two) times daily.   leflunomide 20 MG tablet Commonly known as: ARAVA Take 20 mg by mouth every morning.   meclizine 25 MG tablet Commonly known as: ANTIVERT Take 1 tablet (25 mg total) by mouth 3 (three) times daily as needed for  dizziness.   metoprolol succinate 50 MG 24 hr tablet Commonly known as: TOPROL-XL TAKE 1 TABLET BY MOUTH EVERY DAY WITH OR IMMEDIATELY FOLLOWING A MEAL What changed: See the new instructions.   montelukast 10 MG tablet Commonly known as: SINGULAIR TAKE 1 TABLET BY MOUTH AT BEDTIME   multivitamin tablet Take 1 tablet by mouth daily.   Oxycodone HCl 10 MG Tabs Take 1 tablet (10 mg total) by mouth every 6 (six) hours as needed (moderate pain). What changed:   how much to take  when to take this  reasons to take this   polyethylene glycol 17 g packet Commonly known as: MIRALAX / GLYCOLAX Take 17 g by mouth daily as needed for mild constipation.   potassium chloride SA 20 MEQ tablet Commonly known as: Klor-Con M20 TAKE 1/2 TABLET (=10MEQ) TWO TIMES A DAY ; MAY TAKE AND EXTRA TABLET WHEN TAKING TORSEMIDE   predniSONE 10 MG tablet Commonly known as: DELTASONE Take 1 tablet (10 mg total) by mouth daily with breakfast.   SF 5000 Plus 1.1 % Crea dental cream Generic drug: sodium fluoride Place 1 application onto teeth 2 (two) times daily.   sodium chloride 0.65 % Soln nasal spray Commonly known as: OCEAN Place 1 spray into both nostrils as needed  for congestion.   terazosin 10 MG capsule Commonly known as: HYTRIN TAKE 1 CAPSULE BY MOUTH AT BEDTIME   tolterodine 2 MG 24 hr capsule Commonly known as: DETROL LA TAKE 1 CAPSULE(2 MG) BY MOUTH DAILY AS NEEDED FOR BLADDER SPASMS   torsemide 20 MG tablet Commonly known as: DEMADEX Take 20 mg by mouth twice daily as needed for swelling   Uloric 40 MG tablet Generic drug: febuxostat Take 40 mg by mouth daily.      DISCHARGE INSTRUCTIONS:   DIET:  Cardiac diet DISCHARGE CONDITION:  Stable ACTIVITY:  Activity as tolerated OXYGEN:  Home Oxygen: No.  Oxygen Delivery: room air DISCHARGE LOCATION:  nursing home   If you experience worsening of your admission symptoms, develop shortness of breath, life threatening  emergency, suicidal or homicidal thoughts you must seek medical attention immediately by calling 911 or calling your MD immediately  if symptoms less severe.  You Must read complete instructions/literature along with all the possible adverse reactions/side effects for all the Medicines you take and that have been prescribed to you. Take any new Medicines after you have completely understood and accpet all the possible adverse reactions/side effects.   Please note  You were cared for by a hospitalist during your hospital stay. If you have any questions about your discharge medications or the care you received while you were in the hospital after you are discharged, you can call the unit and asked to speak with the hospitalist on call if the hospitalist that took care of you is not available. Once you are discharged, your primary care physician will handle any further medical issues. Please note that NO REFILLS for any discharge medications will be authorized once you are discharged, as it is imperative that you return to your primary care physician (or establish a relationship with a primary care physician if you do not have one) for your aftercare needs so that they can reassess your need for medications and monitor your lab values.    On the day of Discharge:  VITAL SIGNS:  Blood pressure 139/77, pulse (!) 57, temperature 97.6 F (36.4 C), temperature source Oral, resp. rate 18, height _0  (1.6 m), weight (!) 142.7 kg, SpO2 96 %. PHYSICAL EXAMINATION:  GENERAL:  69 y.o.-year-old patient lying in the bed with no acute distress.  EYES: Pupils equal, round, reactive to light and accommodation. No scleral icterus. Extraocular muscles intact.  HEENT: Head atraumatic, normocephalic. Oropharynx and nasopharynx clear.  NECK:  Supple, no jugular venous distention. No thyroid enlargement, no tenderness.  LUNGS: Normal breath sounds bilaterally, no wheezing, rales,rhonchi or crepitation. No use of  accessory muscles of respiration.  CARDIOVASCULAR: S1, S2 normal. No murmurs, rubs, or gallops.  ABDOMEN: Soft, non-tender, non-distended. Bowel sounds present. No organomegaly or mass.  EXTREMITIES: No pedal edema, cyanosis, or clubbing.  NEUROLOGIC: Cranial nerves II through XII are intact. Muscle strength 5/5 in all extremities. Sensation intact. Gait not checked.  PSYCHIATRIC: The patient is alert and oriented x 3.  SKIN: No obvious rash, lesion, or ulcer.  DATA REVIEW:   CBC Recent Labs  Lab 04/04/19 0539  WBC 7.4  HGB 12.5  HCT 39.8  PLT 275    Chemistries  Recent Labs  Lab 03/30/19 0207 03/31/19 0548 04/04/19 0539  NA 132*   < > 139  K 3.5   < > 4.0  CL 98   < > 102  CO2 23   < > 29  GLUCOSE 114*   < >  101*  BUN 15   < > 23  CREATININE 0.77   < > 0.71  CALCIUM 9.4   < > 9.6  AST 42*  --   --   ALT 21  --   --   ALKPHOS 84  --   --   BILITOT 2.5*  --   --    < > = values in this interval not displayed.     Microbiology Results  Results for orders placed or performed during the hospital encounter of 03/30/19  Culture, blood (routine x 2)     Status: None   Collection Time: 03/30/19  1:57 AM   Specimen: BLOOD  Result Value Ref Range Status   Specimen Description BLOOD LEFT HAND  Final   Special Requests   Final    BOTTLES DRAWN AEROBIC AND ANAEROBIC Blood Culture adequate volume   Culture   Final    NO GROWTH 5 DAYS Performed at [facility], [address] [place], [place] [address]    Report Status 04/04/2019 FINAL  Final  Urine culture     Status: None   Collection Time: 03/30/19  1:57 AM   Specimen: Urine, Random  Result Value Ref Range Status   Specimen Description   Final    URINE, RANDOM Performed at [facility], [address] [place], [place] [address]    Special Requests   Final    NONE Performed at [facility], [address] [place], [place] [address]    Culture   Final    NO GROWTH Performed at  [facility], [place] [address], [place], [place] [address]    Report Status 03/31/2019 FINAL  Final  SARS CORONAVIRUS 2 (TAT 6-24 HRS) Nasopharyngeal Nasopharyngeal Swab     Status: None   Collection Time: 03/30/19  1:58 AM   Specimen: Nasopharyngeal Swab  Result Value Ref Range Status   SARS Coronavirus 2 NEGATIVE NEGATIVE Final    Comment: (NOTE) SARS-CoV-2 target nucleic acids are NOT DETECTED. The SARS-CoV-2 RNA is generally detectable in upper and lower respiratory specimens during the acute phase of infection. Negative results do not preclude SARS-CoV-2 infection, do not rule out co-infections with other pathogens, and should not be used as the sole basis for treatment or other patient management decisions. Negative results must be combined with clinical observations, patient history, and epidemiological information. The expected result is Negative. Fact Sheet for Patients: SugarRoll.be Fact Sheet for Healthcare Providers: https://www.woods-mathews.com/ This test is not yet approved or cleared by the [place] FDA and  has been authorized for detection and/or diagnosis of SARS-CoV-2 by FDA under an Emergency Use Authorization (EUA). This EUA will remain  in effect (meaning this test can be used) for the duration of the COVID-19 declaration under Section 56 4(b)(1) of the Act, 21 U.S.C. section 360bbb-3(b)(1), unless the authorization is terminated or revoked sooner. Performed at [facility], [place] [address], [place], [place] [address]   Culture, blood (routine x 2)     Status: None   Collection Time: 03/30/19  2:06 AM   Specimen: Left Antecubital; Blood  Result Value Ref Range Status   Specimen Description LEFT ANTECUBITAL  Final   Special Requests   Final    Blood Culture results may not be optimal due to an excessive volume of blood received in culture bottles   Culture   Final    NO GROWTH 5 DAYS Performed at  [facility], [address], [place], [place] [address]    Report Status 04/04/2019 FINAL  Final  SARS CORONAVIRUS 2 (TAT 6-24 HRS) Nasopharyngeal Nasopharyngeal Swab     Status: None   Collection Time: 04/01/19  5:27 PM   Specimen: Nasopharyngeal Swab  Result Value Ref Range Status   SARS Coronavirus 2 NEGATIVE NEGATIVE Final    Comment: (NOTE) SARS-CoV-2 target nucleic acids are NOT DETECTED. The SARS-CoV-2 RNA is generally detectable in upper and lower respiratory specimens during the acute phase of infection. Negative results do not preclude SARS-CoV-2 infection, do not rule out co-infections with other pathogens, and should not be used as the sole basis for treatment or other patient management decisions. Negative results must be combined with clinical observations, patient history, and epidemiological information. The expected result is Negative. Fact Sheet for Patients: SugarRoll.be Fact Sheet for Healthcare Providers: https://www.woods-mathews.com/ This test is not yet approved or cleared by the [place] FDA and  has been authorized for detection and/or diagnosis of SARS-CoV-2 by FDA under an Emergency Use Authorization (EUA). This EUA will remain  in effect (meaning this test can be used) for the duration of the COVID-19 declaration under Section 56 4(b)(1) of the Act, 21 U.S.C. section 360bbb-3(b)(1), unless the authorization is terminated or revoked sooner. Performed at [facility], [place] [address], [place], [place] [address]   SARS CORONAVIRUS 2 (TAT 6-24 HRS) Nasopharyngeal Nasopharyngeal Swab     Status: None   Collection Time: 04/03/19  9:28 AM   Specimen: Nasopharyngeal Swab  Result Value Ref Range Status   SARS Coronavirus 2 NEGATIVE NEGATIVE Final    Comment: (NOTE) SARS-CoV-2 target nucleic acids are NOT DETECTED. The SARS-CoV-2 RNA is generally detectable in upper and lower respiratory specimens  during the acute phase of infection. Negative results do not preclude SARS-CoV-2 infection, do not rule out co-infections with other pathogens, and should not be used as the sole basis for treatment or other patient management decisions. Negative results must be combined with clinical observations, patient history, and epidemiological information. The expected result is Negative. Fact Sheet for Patients: SugarRoll.be Fact Sheet for Healthcare Providers: https://www.woods-mathews.com/ This test is not yet approved or cleared by the [place] FDA and  has been authorized for detection and/or diagnosis of SARS-CoV-2 by FDA under an Emergency Use Authorization (EUA). This EUA will remain  in effect (meaning this test can be used) for the duration of the COVID-19 declaration under Section 56 4(b)(1) of the Act, 21 U.S.C. section 360bbb-3(b)(1), unless the authorization is terminated or revoked sooner. Performed at [facility], [place] [address] [place], [place] [address]      Contact information for follow-up providers    [facility] Follow up on 04/09/2019.   Specialty: Cardiology Why: at 10:30am. Enter through the [place] entrance Contact information: [place]       Venia Carbon, MD. Go on 04/11/2019.   Specialties: Internal Medicine, Pediatrics Why: appointment at 12pm Contact information: [place] [address] [phone]        Emmaline Kluver., MD. Schedule an appointment as soon as possible for a visit in 2 weeks.   Specialty: Rheumatology Contact information: [place] [id number] [phone]            Contact information for after-discharge care    Destination  [facility] .     Service: Skilled Nursing Contact information: [place] [phone]                   Management plans discussed with the patient, family and they are in agreement.  CODE STATUS: Full Code   TOTAL TIME TAKING CARE OF THIS PATIENT: 45 minutes.    Max Sane M.D on 04/04/2019 at 8:27 AM  [facility]   CC: Primary care physician; Venia Carbon, MD   Note: This dictation was prepared with Dragon dictation along with smaller phrase technology. Any transcriptional errors that result from this process are unintentional.

## 2019-04-07 DIAGNOSIS — Z23 Encounter for immunization: Secondary | ICD-10-CM | POA: Diagnosis not present

## 2019-04-08 ENCOUNTER — Telehealth: Payer: Self-pay

## 2019-04-08 ENCOUNTER — Telehealth: Payer: Self-pay | Admitting: Internal Medicine

## 2019-04-08 ENCOUNTER — Other Ambulatory Visit: Payer: Self-pay

## 2019-04-08 DIAGNOSIS — M069 Rheumatoid arthritis, unspecified: Secondary | ICD-10-CM | POA: Diagnosis not present

## 2019-04-08 DIAGNOSIS — R5381 Other malaise: Secondary | ICD-10-CM | POA: Diagnosis not present

## 2019-04-08 DIAGNOSIS — E785 Hyperlipidemia, unspecified: Secondary | ICD-10-CM | POA: Diagnosis not present

## 2019-04-08 DIAGNOSIS — I509 Heart failure, unspecified: Secondary | ICD-10-CM | POA: Diagnosis not present

## 2019-04-08 MED ORDER — TERAZOSIN HCL 10 MG PO CAPS: 10.0000 mg | ORAL_CAPSULE | Freq: Every day | ORAL | 3 refills | Status: DC

## 2019-04-08 NOTE — Telephone Encounter (Signed)
Hank you. I have updated the chart

## 2019-04-08 NOTE — Telephone Encounter (Signed)
Error

## 2019-04-08 NOTE — Telephone Encounter (Signed)
Pt called to let you know she received her 1st covid vaccine yesterday 04/07/2019 at rehab she has no idea which manufacture

## 2019-04-08 NOTE — Telephone Encounter (Signed)
Spoke to pt. She thinks it may have been [employer]. But, she will ask them and let us know.

## 2019-04-08 NOTE — Telephone Encounter (Signed)
Patient called and stated she had the Moderna covid vaccine.

## 2019-04-09 ENCOUNTER — Telehealth: Payer: Self-pay | Admitting: Family

## 2019-04-09 ENCOUNTER — Ambulatory Visit: Payer: Federal, State, Local not specified - PPO | Admitting: Family

## 2019-04-09 NOTE — Telephone Encounter (Signed)
Unable to reach patient due to her being at [facility] health care. Patient Refused and cancelled  her New patient [facility]F Clinic appointment we had after her recent hospital d/c and therefore was unable to follow up with her on how she is feeling since. [facility] did state they are able to check her weight daily but she just got there so hasnt started yet but are following a low sodium diet for her and so far is taking meds without issue.   Alyse Low, [place]

## 2019-04-10 DIAGNOSIS — Z9181 History of falling: Secondary | ICD-10-CM | POA: Diagnosis not present

## 2019-04-10 DIAGNOSIS — J449 Chronic obstructive pulmonary disease, unspecified: Secondary | ICD-10-CM | POA: Diagnosis not present

## 2019-04-10 DIAGNOSIS — I480 Paroxysmal atrial fibrillation: Secondary | ICD-10-CM | POA: Diagnosis not present

## 2019-04-10 DIAGNOSIS — M109 Gout, unspecified: Secondary | ICD-10-CM | POA: Diagnosis not present

## 2019-04-11 ENCOUNTER — Ambulatory Visit: Payer: Federal, State, Local not specified - PPO | Admitting: Internal Medicine

## 2019-04-11 ENCOUNTER — Telehealth: Payer: Self-pay

## 2019-04-11 NOTE — Telephone Encounter (Signed)
Okay I will sign them when they come

## 2019-04-11 NOTE — Telephone Encounter (Signed)
I received a call from Judson Roch with [facility], and she states she just wanted to give a FYI to Dr. Silvio Pate that they will be caring for patient, and that they will be sending over orders for patient for him to sign if needed.  Will route to Dr. Silvio Pate as Juluis Rainier. Thanks!

## 2019-04-14 ENCOUNTER — Other Ambulatory Visit: Payer: Self-pay | Admitting: *Deleted

## 2019-04-14 DIAGNOSIS — I5033 Acute on chronic diastolic (congestive) heart failure: Secondary | ICD-10-CM

## 2019-04-14 NOTE — Patient Outreach (Signed)
Member screened for potential [facility] Care Management needs as a benefit of [place] [employer].  Verified in Patient Pear[place] that Mrs. Schmoll discharged from [facility] on 04/10/19.  She transitioned to home prior to [employer].  Will make referral to [facility] for complex case management.   Mrs. Crisci has a medical history of CHF, HLD, HTN, gout, CAD, PAF, fall, COPD.  Marthenia Rolling, MSN-Ed, RN,BSN [facility] Acute Care Coordinator [phone] [facility]) [phone]  (Toll free office)

## 2019-04-15 ENCOUNTER — Telehealth: Payer: Self-pay

## 2019-04-15 ENCOUNTER — Telehealth: Payer: Self-pay | Admitting: Internal Medicine

## 2019-04-15 DIAGNOSIS — I5033 Acute on chronic diastolic (congestive) heart failure: Secondary | ICD-10-CM | POA: Diagnosis not present

## 2019-04-15 DIAGNOSIS — I2510 Atherosclerotic heart disease of native coronary artery without angina pectoris: Secondary | ICD-10-CM | POA: Diagnosis not present

## 2019-04-15 DIAGNOSIS — G4730 Sleep apnea, unspecified: Secondary | ICD-10-CM | POA: Diagnosis not present

## 2019-04-15 DIAGNOSIS — I480 Paroxysmal atrial fibrillation: Secondary | ICD-10-CM | POA: Diagnosis not present

## 2019-04-15 DIAGNOSIS — Z9181 History of falling: Secondary | ICD-10-CM | POA: Diagnosis not present

## 2019-04-15 DIAGNOSIS — E782 Mixed hyperlipidemia: Secondary | ICD-10-CM | POA: Diagnosis not present

## 2019-04-15 DIAGNOSIS — M069 Rheumatoid arthritis, unspecified: Secondary | ICD-10-CM | POA: Diagnosis not present

## 2019-04-15 DIAGNOSIS — I110 Hypertensive heart disease with heart failure: Secondary | ICD-10-CM | POA: Diagnosis not present

## 2019-04-15 DIAGNOSIS — M109 Gout, unspecified: Secondary | ICD-10-CM | POA: Diagnosis not present

## 2019-04-15 DIAGNOSIS — J449 Chronic obstructive pulmonary disease, unspecified: Secondary | ICD-10-CM | POA: Diagnosis not present

## 2019-04-15 NOTE — Telephone Encounter (Signed)
Mickel Baas PT with [facility] ? Left v/m requesting verbal orders for [facility] PT 2 x a wk for 3 wks and  1 x a wk for 3 wks.

## 2019-04-15 NOTE — Telephone Encounter (Signed)
40 minutes is good.

## 2019-04-15 NOTE — Telephone Encounter (Signed)
Can on of you see Dr Silvio Pate  Pt for hospital follow up

## 2019-04-15 NOTE — Telephone Encounter (Signed)
Happy to see her sooner. Would recommend keeping the 3/17 appointment in case additional follow-up is needed.   Lesleigh Noe

## 2019-04-15 NOTE — Telephone Encounter (Signed)
If she needs to be seen in the next 2 weeks, we will have to ask another provider if they would be willing to do her hospital follow-up in Dr Alla German absence.

## 2019-04-15 NOTE — Telephone Encounter (Signed)
I scheduled pt hospital follow up 05/14/2019.  She stated she was told she needed to follow up within 2 weeks.  Is it ok to wait ??

## 2019-04-15 NOTE — Telephone Encounter (Signed)
Thank you I scheduled her for 2/23  I scheduled her 16min or do you want 20.

## 2019-04-16 ENCOUNTER — Other Ambulatory Visit: Payer: Self-pay | Admitting: *Deleted

## 2019-04-16 ENCOUNTER — Encounter: Payer: Self-pay | Admitting: *Deleted

## 2019-04-16 NOTE — Telephone Encounter (Signed)
Verbal orders given to Laura. 

## 2019-04-16 NOTE — Telephone Encounter (Signed)
okay

## 2019-04-16 NOTE — Patient Outreach (Addendum)
[facility]) Care Management [place], Transition of Care day # 1 Post- SNF discharge day # 6 EMMI Red Alert notification- General Discharge  04/16/2019  Tina Pacheco 11-28-1950 [id number]  EMMI Red Alert notification- General Discharge EMMI call date/ day # : Tuesday April 15, 2019; day # 4 Red Alert reason(s): "No scheduled follow up"  Successful telephone outreach to Tina Pacheco, 69 y/o female referred initially to [facility] RN CM for transition of care after recent hospitalization January 31- April 04, 2019 for weakness, multiple falls at home, shortness of breath/ acute on chronic CHF.  Patient was discharged to SNF for rehabilitation and was subsequently discharged from SNF to home on April 10, 2019 with home health services.  Notified of EMMI Red alert this morning.  Patient has history including, but not limited to, morbid obesity; HTN/ HLD; chronic pain related to osteoarthritis/ rheumatoid arthritis; ongoing use of narcotics; p- Atrial fibrillation; CHF; and COPD.  HIPAA/ identity verified and purpose of call/ [facility] CM services discussed with patient; patient provides verbal consent for [name] CM involvement in her care and she provides verbal consent to speak with her husband "Tina Pacheco" at any time if indicated; husband noted to be on [place] as well.  Today patient reports "doing much better" since discharge from SNF.  She denies pain but admits to intermittent chronic pain due to rheumatoid arthritis.  Denies clinical concerns and sounds to be in no distress throughout 75 minute phone call today.  Patient further reports:  Medications: -- Has all medicationsand takes as prescribed;denies questions/ concerns around current medications.  -- self-manage medications; takes directly out of prescription bottles, does not use weekly pill planner box stating she has tried pill boxes before and they "don't work for" her -- patient declines  medication review today but is agreeable at next outreach  Home health [facility]) services: -- [name] services for PT in [facility] through [facility] -- confirms that [name] services have begun; had PT visit yesterday; does not believes she needs additional RN home health services; confirms that she has contact information for home health agency; expects next visit on Friday -- discussed differences between home health and [name] CM services with patient; encouraged patient's active participation in home health PT and discussed benefits of PT for strengthening/ balance post- SNF discharge   Provider appointments: -- All upcoming provider appointments were reviewed with patient today: confirms that she has a scheduled office visit with PCP next week  Safety/ Mobility/ Falls: -- denies new/ recent falls post- recent SNF discharge; admits to several falls over course of last year; states she had foot surgery "about a year ago" which she attributes to her falls; admits that her balance is "off" intermittently -- assistive devices: uses rollator walker, cane, and motorized scooter -- general fall risks/ prevention education discussed with patient today  Holiday representative needs: -- currently denies community resource needs, stating supportive family members that assist with care needs as indicated; has local daughter that has arranged for meal delivery services for her post- SNF discharge -- family- primarily husband- provides transportation for patient to all provider appointments, errands, etc -- SDOH completed for: depression, transportation, food insecurity- no concerns identified  Advanced Directive (AD) Planning:   --reports does not currently have exisisting AD in place and agrees to my sending printed educational material/ planning packet for same; basics of Advanced directive planning were discussed with patient who endorses herself as "full" code  Self-health management of  CHF: -- currently does not  monitor/ record daily weights, although she acknowledges that she was told to begin this practice: discussed with patient basic pathophysiology of fluid retention in setting CHF, with daily weight monitoring at home as best early indicator of fluid retention- patient agrees to begin daily weight monitoring/ recording at home -- discussed early late signs of CHF and weight guidelines/ action plan in setting of CHF -- reports that in the past she has been instructed to take "extra" torsemide for swelling or shortness of breath- discussed benefits of following established action plan for weight gain (early indicator) instead of waiting for swelling (late indicator) -- "tires" to follow low salt diet, but admits she likes salty foods; reports does not add salt to food, but does occasionally eat high salt snacks such as potato chips- pathophysiology of fluid retention in setting of high salt intake discussed with patient and she was encouraged to limit salty foods -- received first vaccine for corona virus while at SNF; patient is not sure how her second dose will be scheduled/ administered now that she is home from SNF and verbalizes plans to contact SNF for information; encouraged patient to let me know if I can assist in obtaining this information  Patient denies further issues, concerns, or problems today.  I provided/ confirmed that patient has my direct phone number, the main [facility] CM office phone number, and the [facility] CM 24-hour nurse advice phone number should issues arise prior to next scheduled [facility] CM outreach next week post- PCP provider appointment for ongoing transition of care/ medication review and completion of [facility] CM initial assessment.  Encouraged patient to contact me directly if needs, questions, issues, or concerns arise prior to next scheduled outreach; patient agreed to do so.  Plan:  Patient will take medications as prescribed and will attend all scheduled provider appointments  Patient  will promptly notify care providers for any new concerns/ issues/ problems that arise  Patient will actively participate in home health services as ordered post-hospital discharge  Patient will begin monitoring/ recording daily weights   I will make patient's PCP aware of [facility] RN CM involvement in patient's care-- will send barriers letter  I will send patient printed educational material around daily weight monitoring in setting of CHF; low salt diet; and Advanced directive planning  [place] outreach for ongoing transition of care  to continue with scheduled phone call next week   [facility] CM Care Plan Problem One     Most Recent Value  Care Plan Problem One  High Risk for hospital re-admission related to/ as evidenced by recent hospitalization for CHF exacerbation January 31- April 04, 2019 with SNF rehabilitation visit  Role Documenting the Problem One  Care Management Coordinator  Care Plan for Problem One  Active  [facility]N Long Term Goal   Over the next 31 days, patient will not experience unplanned hospital re- admission as evidenced by patient reporting and review of EMR during [facility] RN CM outreach  [facility] Long Term Goal Start Date  04/16/19  Interventions for Problem One Long Term Goal  Initiated [facility] CM services and [facility] program,  discussed with patient her current clinical condition and confirmed that she does not have any current clinical concerns,  confirmed that she has a scheduled PCP appointment next week and has no transportation concerns,  confirmed that patient has and is taking all prescribed medications,  discussed fall prevention with patient  [facility] CM Short Term Goal #1  Over the next 30 days, patient will continue participating in home health services for PT as evidenced by patient reportating and collaboration with home health team as indicated during [facility] RN CM outreach  [facility] CM Short Term Goal #1 Start Date  04/16/19  Interventions for Short Term Goal #1  Confirmed  that patient has been engaged and visited by home health team,  confirmed that she has contact information for home health team,  encouraged patient's ongoing active participation with home health services and discussed difference between home health and [facility] CM services  [facility] CM Short Term Goal #2   Over the next 21 days, patient will complete medication review with [facility] RN CM as evidenced by successful completion of medication review during [facility] RN CM outreach  [facility] CM Short Term Goal #2 Start Date  04/16/19  Interventions for Short Term Goal #2  Discussed importance of prompt medication review with patient,  confirmed that patient has all prescribed medications and does not have any current medications concerns  [facility]N CM Short Term Goal #3  Over the next 7 days, patient will monitor and record daily weights at home, as evidenced by patient reporting/ review of same during [facility] RN CM outreach  [facility] CM Short Term Goal #3 Start Date  04/16/19  Interventions for Short Tern Goal #3  Discussed with patient purpose of daily weights at home in setting of CHF,  using teachback method, provided education around signs/ symptoms CHF along with corresponding action plan,  discussed weight gain guidelines in CHF and corresponding action plan     I appreciate the opportunity to participate in Amariana's care,  Oneta Rack, RN, BSN, [employer] Coordinator [facility] Care Management  [phone]

## 2019-04-22 ENCOUNTER — Ambulatory Visit (INDEPENDENT_AMBULATORY_CARE_PROVIDER_SITE_OTHER): Payer: Medicare Other | Admitting: Family Medicine

## 2019-04-22 ENCOUNTER — Other Ambulatory Visit: Payer: Self-pay

## 2019-04-22 ENCOUNTER — Encounter: Payer: Self-pay | Admitting: Family Medicine

## 2019-04-22 VITALS — BP 128/64 | HR 96 | Temp 97.7°F | Ht 63.0 in | Wt 322.0 lb

## 2019-04-22 DIAGNOSIS — J449 Chronic obstructive pulmonary disease, unspecified: Secondary | ICD-10-CM | POA: Diagnosis not present

## 2019-04-22 DIAGNOSIS — I10 Essential (primary) hypertension: Secondary | ICD-10-CM

## 2019-04-22 DIAGNOSIS — G894 Chronic pain syndrome: Secondary | ICD-10-CM | POA: Diagnosis not present

## 2019-04-22 DIAGNOSIS — I480 Paroxysmal atrial fibrillation: Secondary | ICD-10-CM | POA: Diagnosis not present

## 2019-04-22 DIAGNOSIS — I5032 Chronic diastolic (congestive) heart failure: Secondary | ICD-10-CM

## 2019-04-22 DIAGNOSIS — I110 Hypertensive heart disease with heart failure: Secondary | ICD-10-CM | POA: Diagnosis not present

## 2019-04-22 DIAGNOSIS — M109 Gout, unspecified: Secondary | ICD-10-CM | POA: Diagnosis not present

## 2019-04-22 DIAGNOSIS — F1120 Opioid dependence, uncomplicated: Secondary | ICD-10-CM

## 2019-04-22 DIAGNOSIS — M1990 Unspecified osteoarthritis, unspecified site: Secondary | ICD-10-CM | POA: Diagnosis not present

## 2019-04-22 DIAGNOSIS — M069 Rheumatoid arthritis, unspecified: Secondary | ICD-10-CM | POA: Diagnosis not present

## 2019-04-22 DIAGNOSIS — I5033 Acute on chronic diastolic (congestive) heart failure: Secondary | ICD-10-CM | POA: Diagnosis not present

## 2019-04-22 DIAGNOSIS — I2510 Atherosclerotic heart disease of native coronary artery without angina pectoris: Secondary | ICD-10-CM | POA: Diagnosis not present

## 2019-04-22 MED ORDER — OXYCODONE HCL 10 MG PO TABS: 20.0000 mg | ORAL_TABLET | Freq: Two times a day (BID) | ORAL | 0 refills | Status: DC | PRN

## 2019-04-22 NOTE — Assessment & Plan Note (Signed)
Pt weight is up today. Advised taking torsemide when she goes home. She did not go to the HF clinic but follows with cardiology. She has diastolic dysfunction. Advised that she call cardiology to discuss follow-up and see if they want her to be seen at HF clinic.

## 2019-04-22 NOTE — Assessment & Plan Note (Signed)
Regular rate today. Continue treatment.

## 2019-04-22 NOTE — Patient Instructions (Signed)
I'm glad you are doing better.   1) Call your cardiologist and see if they want to see you for follow-up. -- continue to check your weight and take torsemide as needed -- if you are noticing more weight gain and no improvement -- call our office or cardiology to discuss  2) Good luck with the rheumatology appointment tomorrow  3) Return to see Dr. Silvio Pate next month

## 2019-04-22 NOTE — Assessment & Plan Note (Signed)
Reviewed database. Prescriptions appropriate. 1 month refill of oxycodone provided. Has f/u with PCP in 1 month.

## 2019-04-22 NOTE — Assessment & Plan Note (Signed)
Recent hospitalization for RA Flare. Sees Rheumatology tomorrow. Appreciate their input, pt plans to discuss steroid prescription with them.

## 2019-04-22 NOTE — Progress Notes (Signed)
Subjective:     Tina Pacheco is a 69 y.o. female presenting for Hospitalization Follow-up     HPI   #RA Flare - had several falls - still with right shoulder, left knee, and right back pain - pain comes and goes - currently throbbing - will occur with activity and with rest - not as bad as when she went to the hospital - cannot function at her normal level - felt that every joint was hurting  - still taking prednisone - will discuss with rheumatology   #CHF - breathing has been fine - echo was stable - she does not think this is serious - some up and down weight changes - did take torsemide yesterday - weight up this morning but did not take torsemide as had this appointment  Went to rehab for a few weeks  #Chronic pain - on oxycodone - reviewed database - last fill by Dr. Silvio Pate was 03/16/2019 #120 pills and Oxycodone 10 mg - had a prescription for #3 that she didn't request and not sure why this was prescribed - oxycodone allows her to move and do activity and feel like a normal person  #Afib - seemed to be consistently associated with low potassium - diagnosed 6 years ago - taking metoprolol and pradaxa  Review of Systems  1/31-04/04/2019: Hospital - CHF exacerbation, fall, RA flare - discharged on prednisone with Rheum f/u. Afib not on anticoag but score of 5.   Social History   Tobacco Use  Smoking Status Never Smoker  Smokeless Tobacco Never Used        Objective:    BP Readings from Last 3 Encounters:  04/22/19 128/64  04/04/19 (!) 121/96  12/23/18 130/74   Wt Readings from Last 3 Encounters:  04/22/19 (!) 322 lb (146.1 kg)  04/04/19 (!) 314 lb 8 oz (142.7 kg)  12/23/18 (!) 334 lb (151.5 kg)    BP 128/64 (BP Location: Left Arm, Patient Position: Sitting, Cuff Size: Normal)   Pulse 96   Temp 97.7 F (36.5 C) (Temporal)   Ht 5\' 3"  (1.6 m)   Wt (!) 322 lb (146.1 kg)   SpO2 98%   BMI 57.04 kg/m    Physical Exam Constitutional:      General: She is not in acute distress.    Appearance: She is well-developed. She is obese. She is not diaphoretic.     Comments: Ambulates with walker  HENT:     Right Ear: External ear normal.     Left Ear: External ear normal.     Nose: Nose normal.  Eyes:     Conjunctiva/sclera: Conjunctivae normal.  Cardiovascular:     Rate and Rhythm: Normal rate and regular rhythm.  Pulmonary:     Effort: Pulmonary effort is normal.     Breath sounds: Normal breath sounds. No rhonchi or rales.  Musculoskeletal:     Cervical back: Neck supple.     Right lower leg: Edema (1+ ankle) present.     Left lower leg: Edema (1+ ankle) present.  Skin:    General: Skin is warm and dry.     Capillary Refill: Capillary refill takes less than 2 seconds.  Neurological:     Mental Status: She is alert. Mental status is at baseline.  Psychiatric:        Mood and Affect: Mood normal.        Behavior: Behavior normal.           Assessment &  Plan:   Problem List Items Addressed This Visit      Cardiovascular and Mediastinum   HTN (hypertension) - Primary   PAF (paroxysmal atrial fibrillation) (HCC)    Regular rate today. Continue treatment.       CHF (congestive heart failure) (HCC)    Pt weight is up today. Advised taking torsemide when she goes home. She did not go to the HF clinic but follows with cardiology. She has diastolic dysfunction. Advised that she call cardiology to discuss follow-up and see if they want her to be seen at HF clinic.         Musculoskeletal and Integument   Rheumatoid arthritis ([place])    Recent hospitalization for RA Flare. Sees Rheumatology tomorrow. Appreciate their input, pt plans to discuss steroid prescription with them.       Relevant Medications   Oxycodone HCl 10 MG TABS   OA (osteoarthritis)   Relevant Medications   Oxycodone HCl 10 MG TABS     Other   Chronic pain syndrome    Reviewed database. Prescriptions appropriate. 1 month refill of oxycodone  provided. Has f/u with PCP in 1 month.       Relevant Medications   Oxycodone HCl 10 MG TABS   Narcotic dependence ([place])       Return in about 4 weeks (around 05/20/2019).  Lesleigh Noe, MD

## 2019-04-23 ENCOUNTER — Encounter: Payer: Self-pay | Admitting: *Deleted

## 2019-04-23 ENCOUNTER — Other Ambulatory Visit: Payer: Self-pay | Admitting: *Deleted

## 2019-04-23 DIAGNOSIS — Z79899 Other long term (current) drug therapy: Secondary | ICD-10-CM | POA: Diagnosis not present

## 2019-04-23 DIAGNOSIS — Z6836 Body mass index (BMI) 36.0-36.9, adult: Secondary | ICD-10-CM | POA: Diagnosis not present

## 2019-04-23 DIAGNOSIS — M0579 Rheumatoid arthritis with rheumatoid factor of multiple sites without organ or systems involvement: Secondary | ICD-10-CM | POA: Diagnosis not present

## 2019-04-23 DIAGNOSIS — E6609 Other obesity due to excess calories: Secondary | ICD-10-CM | POA: Diagnosis not present

## 2019-04-23 DIAGNOSIS — M170 Bilateral primary osteoarthritis of knee: Secondary | ICD-10-CM | POA: Diagnosis not present

## 2019-04-23 NOTE — Patient Outreach (Signed)
[facility]) Care Management [facility] CM Telephone Outreach, Transition of Care day # 8 Post- SNF discharge day # 13 04/23/2019  Tina Pacheco 1950-04-14 [id number]  Successful telephone outreach to Tina Pacheco, 69 y/o female referred initially to [facility] RN CM for transition of care after recent hospitalization January 31- April 04, 2019 for weakness, multiple falls at home, shortness of breath/ acute on chronic CHF.  Patient was discharged to SNF for rehabilitation and was subsequently discharged from SNF to home on April 10, 2019 with home health services.    Patient has history including, but not limited to, morbid obesity; HTN/ HLD; chronic pain related to osteoarthritis/ rheumatoid arthritis; ongoing use of narcotics; p- Atrial fibrillation; CHF; and COPD.  HIPAA/ identity verified.  Patient reports "doing pretty good" and confirms that she attended appointment with provider today for rheumatoid arthritis.  She denies pain outside of her baseline and denies new/ recent falls.  Sounds to be in no distress throughout call.  Reports her husband was recently hospitalized but is now back at home.  Patient further reports:  Medications: -- Has all medicationsand takes as prescribed;denies questions/ concerns/ recent changes to current medications and is able to verbalize accurate understanding of adjustment of prednisone, as instructed by provider today.  -- continues to self-manage medications; takes directly out of prescription bottles -- denies issues with swallowing medications; continues able to afford medications -- patient was recently discharged from hospital and all medications were reviewed with her today- no concerns/ discrepancies noted  Home health [facility]) services: -- [facility] services remain active for PT in place through [facility] -- confirms that [facility] services have begun; had PT visit yesterday; confirms that she has contact information for home health  agency; expects next visit tomorrow or Friday  Provider appointments: -- All recent and upcoming provider appointments were reviewed with patient today: confirms that she has attended all scheduled appointments; daughter has provided transportation while her husband was hospitalized  Self-health management of CHF: -- has been monitoring/ recording daily weights, as we discussed last week; weights at home reviewed with patient; reiterated previously provided education around basic pathophysiology of fluid retention in setting CHF, with daily weight monitoring at home as best early indicator of fluid retention ---- reports weight ranges over last week between 316-320 lbs, and acknowledges a 5 pound weight gain overnight on 2/22: appropriately took prn torsemide, which decreased following day's weight back by 5 pounds; able to verbalize appropriate weight gain guideline for taking prn diuretic ---- reports this morning's weight as "316 lbs" ---- denies excessive swelling and shortness of breath outside of baseline- patient sounds slightly short of breath as she talks to me while moving around her home; she describes this as her baseline and promptly recovers with rest/ sitting down ---- has not yet received previously mailed educational material around self-health management of CHF/ Advanced directives: encouraged patient to promptly review once she receives and she verbalizes agreement  Patient denies further issues, concerns, or problems today. I provided/ confirmed that patient hasmy direct phone number, the main [facility] CM office phone number, and the [facility] CM 24-hour nurse advice phone number should issues arise prior to next scheduled [facility] CM outreach next week post- PCP provider appointment for ongoing transition of care/ medication review and completion of [facility] CM initial assessment.  Encouraged patient to contact me directly if needs, questions, issues, or concerns arise prior to next scheduled  outreach; patient agreed to do so.  Plan:  Patient  will take medications as prescribed and will attend all scheduled provider appointments  Patient will promptly notify care providers for any new concerns/ issues/ problems that arise  Patient will actively participate in home health services as ordered post-hospital discharge  Patient will continue monitoring/ recording daily weights   Patient will review printed educational material around daily weight monitoring in setting of CHF; low salt diet; and Advanced directive planning once she receives in mail  [place] outreach for ongoing transition of care to continue with scheduled phone call next week  [facility] CM Care Plan Problem One     Most Recent Value  Care Plan Problem One  High Risk for hospital re-admission related to/ as evidenced by recent hospitalization for CHF exacerbation January 31- April 04, 2019 with SNF rehabilitation visit  Role Documenting the Problem One  Care Management Coordinator  Care Plan for Problem One  Active  [facility]N Long Term Goal   Over the next 31 days, patient will not experience unplanned hospital re- admission as evidenced by patient reporting and review of EMR during [facility] RN CM outreach  [facility] Long Term Goal Start Date  04/16/19  Interventions for Problem One Long Term Goal  Discussed current clinical conditin with patient and confirmed that she has no current clinical concerns,  reviewed post- provider office visit medications/ instructions with patient,  encouraged patient to promptly notify care providers for any new concerns/ issues/ problems that arise  [facility] CM Short Term Goal #1   Over the next 30 days, patient will continue participating in home health services for PT as evidenced by patient reportating and collaboration with home health team as indicated during [facility] RN CM outreach  [facility] CM Short Term Goal #1 Start Date  04/16/19  Interventions for Short Term Goal #1  Confirmed that patient  contiues actively participating in home health services for PT and reviewed recent visits with her,  encouraged her ongoing active participation- positive reinforcement provided that patient has tried to perform home health PT exercises daily, even on days when not visited by PT  [facility] CM Short Term Goal #2   Over the next 21 days, patient will complete medication review with [facility] RN CM as evidenced by successful completion of medication review during [facility] RN CM outreach  [facility] CM Short Term Goal #2 Start Date  04/16/19  [facility] CM Short Term Goal #2 Met Date  04/23/19 [Goal Met]  Interventions for Short Term Goal #2  Medication review completed with patient,  no concerns identified post- review,  confirmed that patient continues self-managing medications  [facility] CM Short Term Goal #3  Over the next 23 days, patient will monitor and record daily weights at home, as evidenced by patient reporting/ review of same during [facility] CM outreach  [facility] CM Short Term Goal #3 Start Date  04/23/19 [Goal re-established and extended]  Interventions for Short Tern Goal #3  Confirmed that patient has monitored and recorded daily weights at home post- recent hospital discharge,  positive reinforcement provided,  using teach back method, reiterated previously provided education around purpose of daily weight monitoring at home, along with weight gain guidelines in setting of CHF,  reviewed weights over last week with patient     Oneta Rack, RN, BSN, [place] Coordinator [facility] Care Management  [phone]

## 2019-04-24 DIAGNOSIS — J449 Chronic obstructive pulmonary disease, unspecified: Secondary | ICD-10-CM | POA: Diagnosis not present

## 2019-04-24 DIAGNOSIS — M069 Rheumatoid arthritis, unspecified: Secondary | ICD-10-CM | POA: Diagnosis not present

## 2019-04-24 DIAGNOSIS — M109 Gout, unspecified: Secondary | ICD-10-CM | POA: Diagnosis not present

## 2019-04-24 DIAGNOSIS — I5033 Acute on chronic diastolic (congestive) heart failure: Secondary | ICD-10-CM | POA: Diagnosis not present

## 2019-04-24 DIAGNOSIS — I110 Hypertensive heart disease with heart failure: Secondary | ICD-10-CM | POA: Diagnosis not present

## 2019-04-24 DIAGNOSIS — I2510 Atherosclerotic heart disease of native coronary artery without angina pectoris: Secondary | ICD-10-CM | POA: Diagnosis not present

## 2019-04-25 DIAGNOSIS — I480 Paroxysmal atrial fibrillation: Secondary | ICD-10-CM

## 2019-04-25 DIAGNOSIS — M069 Rheumatoid arthritis, unspecified: Secondary | ICD-10-CM | POA: Diagnosis not present

## 2019-04-25 DIAGNOSIS — E782 Mixed hyperlipidemia: Secondary | ICD-10-CM

## 2019-04-25 DIAGNOSIS — I2510 Atherosclerotic heart disease of native coronary artery without angina pectoris: Secondary | ICD-10-CM | POA: Diagnosis not present

## 2019-04-25 DIAGNOSIS — M109 Gout, unspecified: Secondary | ICD-10-CM

## 2019-04-25 DIAGNOSIS — G4730 Sleep apnea, unspecified: Secondary | ICD-10-CM

## 2019-04-25 DIAGNOSIS — J449 Chronic obstructive pulmonary disease, unspecified: Secondary | ICD-10-CM

## 2019-04-25 DIAGNOSIS — I5033 Acute on chronic diastolic (congestive) heart failure: Secondary | ICD-10-CM | POA: Diagnosis not present

## 2019-04-25 DIAGNOSIS — I110 Hypertensive heart disease with heart failure: Secondary | ICD-10-CM | POA: Diagnosis not present

## 2019-04-25 DIAGNOSIS — Z9181 History of falling: Secondary | ICD-10-CM

## 2019-04-29 DIAGNOSIS — I2510 Atherosclerotic heart disease of native coronary artery without angina pectoris: Secondary | ICD-10-CM | POA: Diagnosis not present

## 2019-04-29 DIAGNOSIS — M069 Rheumatoid arthritis, unspecified: Secondary | ICD-10-CM | POA: Diagnosis not present

## 2019-04-29 DIAGNOSIS — I5033 Acute on chronic diastolic (congestive) heart failure: Secondary | ICD-10-CM | POA: Diagnosis not present

## 2019-04-29 DIAGNOSIS — J449 Chronic obstructive pulmonary disease, unspecified: Secondary | ICD-10-CM | POA: Diagnosis not present

## 2019-04-29 DIAGNOSIS — I110 Hypertensive heart disease with heart failure: Secondary | ICD-10-CM | POA: Diagnosis not present

## 2019-04-29 DIAGNOSIS — M109 Gout, unspecified: Secondary | ICD-10-CM | POA: Diagnosis not present

## 2019-04-30 ENCOUNTER — Other Ambulatory Visit: Payer: Self-pay | Admitting: *Deleted

## 2019-04-30 ENCOUNTER — Encounter: Payer: Self-pay | Admitting: *Deleted

## 2019-04-30 NOTE — Patient Outreach (Signed)
[facility]) Care Management [facility] CM Telephone Outreach, Transition of Care day 15 Post- SNF discharge day # 20 04/30/2019  Tina Pacheco 08-01-1950 [id number]  Successful telephone outreach to Tina Pacheco, 69 y/o female referred initially to [facility] RN CM for transition of care after recent hospitalization January 31- April 04, 2019 for weakness, multiple falls at home, shortness of breath/ acute on chronic CHF. Patient was discharged to SNF for rehabilitation and was subsequently discharged from SNF to home on April 10, 2019 with home health services.   Patient has history including, but not limited to, morbid obesity; HTN/ HLD; chronic pain related to osteoarthritis/ rheumatoid arthritis; ongoing use of narcotics; p- Atrial fibrillation; CHF; and COPD.  HIPAA/ identity verified.  Patient reports "doing okay" and confirms that she continues participating in home health services and tolerating well; verbalizes frustration that her recuperation is "taking so long;" encouraged patient to continue with her efforts, not to over-do, but to consistently follow established plan of care.  She denies pain outside of her baseline and denies new/ recent falls.  Sounds to be in no distress throughout call.   Patient further reports:  -- has and is taking all medications as prescribed; denies new/ recent changes to medications; continues to self- manage medications -- reviewed recent and upcoming expected home health visits- patient endorses active participation and this was encouraged -- accurate understanding of all upcoming provider appointments/ plans to attend all; family continues providing transportation -- continues monitoring/ recording daily weights at home: reports ranges over last week between "316-320 lbs" with a weight this morning of "316 lbs; previously provided education around purpose of daily weights at home and weight gain guidelines insetting of CHF  reiterated with patient -- has received previously provided printed educational material around self-health management of chronic state of CHF: has not reviewed/ read- encouraged to do so  Patient denies further issues, concerns, or problems today. Iconfirmed that patient hasmy direct phone number, the main [facility] CM office phone number, and the [facility] CM 24-hour nurse advice phone number should issues arise prior to next scheduled [facility] CM outreachnext week for ongoing transition of care. Encouraged patient to contact me directly if needs, questions, issues, or concerns arise prior to next scheduled outreach; patient agreed to do so.  Plan:  Patient will take medications as prescribed and will attend all scheduled provider appointments  Patient will promptly notify care providers for any new concerns/ issues/ problems that arise  Patient will actively participate in home health services as ordered post-hospital discharge  Patient willcontinuemonitoring/ recording daily weights   Patient will review printed educational material around daily weight monitoring in setting of CHF; low salt diet; and Advanced directive planning   [facility] CM outreachfor ongoing transition of careto continue with scheduled phone call next week  [facility] CM Care Plan Problem One     Most Recent Value  Care Plan Problem One  High Risk for hospital re-admission related to/ as evidenced by recent hospitalization for CHF exacerbation January 31- April 04, 2019 with SNF rehabilitation visit  Role Documenting the Problem One  Care Management [place] for Problem One  Active  [facility] Long Term Goal   Over the next 31 days, patient will not experience unplanned hospital re- admission as evidenced by patient reporting and review of EMR during [facility] RN CM outreach  [facility] Long Term Goal Start Date  04/16/19  Interventions for Problem One Long Term Goal  Confirmed with  patient that she has not experienced  hospital readmission,  discussed current clinical conditions with patient and confirmed that she has no current clinical concerns and is generally feeling better,  encouraged patient to continue promptly notifying care providers for any concerns/ problems that arise  [facility] CM Short Term Goal #1   Over the next 30 days, patient will continue participating in home health services for PT as evidenced by patient reportating and collaboration with home health team as indicated during [facility] RN CM outreach  [facility] CM Short Term Goal #1 Start Date  04/16/19  Interventions for Short Term Goal #1  Confirmed that patient continues actively participating in home health services- positive reinforcement provided,  confirmed that patient has contact information for home health team,  reviewed with patient recent and expected upcoming home health visits and her perception of progress made with home health PT  [facility] CM Short Term Goal #3  Over the next 23 days, patient will monitor and record daily weights at home, as evidenced by patient reporting/ review of same during [place] outreach  [facility] CM Short Term Goal #3 Start Date  04/23/19  Interventions for Short Tern Goal #3  Confirmed that patient continues monitoring and recording daily weights at home,  confirmed that she has received printed educational material around self-health management of CHF, and encouraged her to review.  Using teachback method, reiterated previously provided education around purpose of daily weights at home/ signs/ symptoms CHF exacerbation,  reviewed with patient recent weights at home     Oneta Rack, [place], BSN, [employer] Coordinator [facility] Care Management  [phone]

## 2019-05-01 DIAGNOSIS — M069 Rheumatoid arthritis, unspecified: Secondary | ICD-10-CM | POA: Diagnosis not present

## 2019-05-01 DIAGNOSIS — J449 Chronic obstructive pulmonary disease, unspecified: Secondary | ICD-10-CM | POA: Diagnosis not present

## 2019-05-01 DIAGNOSIS — M109 Gout, unspecified: Secondary | ICD-10-CM | POA: Diagnosis not present

## 2019-05-01 DIAGNOSIS — I5033 Acute on chronic diastolic (congestive) heart failure: Secondary | ICD-10-CM | POA: Diagnosis not present

## 2019-05-01 DIAGNOSIS — I110 Hypertensive heart disease with heart failure: Secondary | ICD-10-CM | POA: Diagnosis not present

## 2019-05-01 DIAGNOSIS — I2510 Atherosclerotic heart disease of native coronary artery without angina pectoris: Secondary | ICD-10-CM | POA: Diagnosis not present

## 2019-05-02 ENCOUNTER — Telehealth: Payer: Self-pay

## 2019-05-02 NOTE — Telephone Encounter (Signed)
Mickel Baas PT with [facility] notified as instructed and voiced understanding. Nothing further needed.

## 2019-05-02 NOTE — Telephone Encounter (Signed)
Donalynn Furlong PT with [facility] request verbal orders for [name] PT 2 x a wk for 2 wks and 1 x a wk for 1 wk.

## 2019-05-02 NOTE — Telephone Encounter (Signed)
That is fine 

## 2019-05-05 DIAGNOSIS — Z23 Encounter for immunization: Secondary | ICD-10-CM | POA: Diagnosis not present

## 2019-05-06 DIAGNOSIS — I2510 Atherosclerotic heart disease of native coronary artery without angina pectoris: Secondary | ICD-10-CM | POA: Diagnosis not present

## 2019-05-06 DIAGNOSIS — J449 Chronic obstructive pulmonary disease, unspecified: Secondary | ICD-10-CM | POA: Diagnosis not present

## 2019-05-06 DIAGNOSIS — M069 Rheumatoid arthritis, unspecified: Secondary | ICD-10-CM | POA: Diagnosis not present

## 2019-05-06 DIAGNOSIS — I110 Hypertensive heart disease with heart failure: Secondary | ICD-10-CM | POA: Diagnosis not present

## 2019-05-06 DIAGNOSIS — M109 Gout, unspecified: Secondary | ICD-10-CM | POA: Diagnosis not present

## 2019-05-06 DIAGNOSIS — I5033 Acute on chronic diastolic (congestive) heart failure: Secondary | ICD-10-CM | POA: Diagnosis not present

## 2019-05-07 ENCOUNTER — Other Ambulatory Visit: Payer: Self-pay | Admitting: *Deleted

## 2019-05-07 ENCOUNTER — Encounter: Payer: Self-pay | Admitting: *Deleted

## 2019-05-07 NOTE — Patient Outreach (Signed)
Tina Pacheco  05/07/2019  Tina Pacheco 05-20-1950 [id number]  Successful telephone outreach to Tina Pacheco, 69 y/o female referred initially to [facility] RN CM for transition of care after recent hospitalization January 31- April 04, 2019 for weakness, multiple falls at home, shortness of breath/ acute on chronic CHF. Patient was discharged to SNF for rehabilitation and was subsequently discharged from SNF to home on April 10, 2019 with home health services.   Patient has history including, but not limited to, morbid obesity; HTN/ HLD; chronic pain related to osteoarthritis/ rheumatoid arthritis; ongoing use of narcotics; p- Atrial fibrillation; CHF; and COPD.  HIPAA/ identity verified. Patient reports "doing pretty good," and confirms that she continues participating in home health services and tolerating well; again verbalizes frustration that her recuperation is "taking so long;" wishes it "would be quicker."  Confirms that obtained COVID vaccine dose # 2 on Monday 05/05/19.  Patient denies new/ recent falls and sounds to be in no distress throughout call today.  Patient further reports:  -- has and is taking all medications as prescribed; denies new/ recent changes to medications; continues to self- manage medications -- reviewed recent and upcoming expected home health visits- patient endorses active participation and this was again encouraged -- accurate understanding of all upcoming provider appointments/ plans to attend all; family continues providing transportation -- continues monitoring/ recording daily weights at home: reports weight gain "late last week into this week," stating that over the course of the week, her weights gradually increased to "321- 323 lbs;" (over previously reported baseline of 316-320 lbs;" reports that she took torsemide dose as  instructed and that her weight has gradually decreased with weight today of "321 lbs."  Denies other signs/ symptoms yellow CHF zone-- positive reinforcement provided for patient following established action plan for weight gain.  Using teach back method, reviewed with patient previously provided education around action plan for CHF yellow zone and importance of daily weights at home.  Patient denies further issues, concerns, or problems today. Iconfirmed that patient hasmy direct phone number, the main [facility] CM office phone number, and the [facility] CM 24-hour nurse advice phone number should issues arise prior to next scheduled [facility] CM outreachnext week for ongoing transition of care. Encouraged patient to contact me directly if needs, questions, issues, or concerns arise prior to next scheduled outreach; patient agreed to do so.  Plan:  Patient will take medications as prescribed and will attend all scheduled provider appointments  Patient will promptly notify care providers for any new concerns/ issues/ problems that arise  Patient will actively participate in home health services as ordered post-hospital discharge  Patient willcontinuemonitoring/ recording daily weights and will follow established action plan for weight gain  [place] outreachfor ongoing transition of careto continue with scheduled phone call next week  [facility] CM Care Plan Problem One     Most Recent Value  Care Plan Problem One  High Risk for hospital re-admission related to/ as evidenced by recent hospitalization for CHF exacerbation January 31- April 04, 2019 with SNF rehabilitation visit  Role Documenting the Problem One  Care Management [place] for Problem One  Active  [facility]N Long Term Goal   Over the next 31 days, patient will not experience unplanned hospital re- admission as evidenced by patient reporting and review of EMR during [facility] RN CM outreach  [facility] Long Term Goal Start Date  04/16/19   Interventions for Problem One Long Term Goal  Discussed with patient current clinical condition and confirmed that she believes she is doing well and has no current clinical concerns/ issues/ problems,  encouraged patient to promptly notify care providers for any new concerns that arise  [facility] CM Short Term Goal #1   Over the next 30 days, patient will continue participating in home health services for PT as evidenced by patient reportating and collaboration with home health team as indicated during [facility] RN CM outreach  [facility] CM Short Term Goal #1 Start Date  04/16/19  Interventions for Short Term Goal #1  Confirmed with patient that home health services continue and that she continues to actively participate,  confirmed that she has contact information for home health team  [facility] CM Short Term Goal #3  Over the next 23 days, patient will monitor and record daily weights at home, as evidenced by patient reporting/ review of same during [place] outreach  [facility] CM Short Term Goal #3 Start Date  04/23/19  Interventions for Short Tern Goal #3  Confirmed that patient continues monitoring and recording daily weights at home,  reviewed with patient recent daily weights and discussed her weight gain last week and that she followed action plan as instructed to take extra diuretic,  confirmed that patient is able to verbalize weight gain guidelines in setting of CHF along with corresponding action plan     Oneta Rack, RN, BSN, [place] Coordinator [facility] Care Management  [phone]

## 2019-05-08 DIAGNOSIS — I110 Hypertensive heart disease with heart failure: Secondary | ICD-10-CM | POA: Diagnosis not present

## 2019-05-08 DIAGNOSIS — I2510 Atherosclerotic heart disease of native coronary artery without angina pectoris: Secondary | ICD-10-CM | POA: Diagnosis not present

## 2019-05-08 DIAGNOSIS — M069 Rheumatoid arthritis, unspecified: Secondary | ICD-10-CM | POA: Diagnosis not present

## 2019-05-08 DIAGNOSIS — J449 Chronic obstructive pulmonary disease, unspecified: Secondary | ICD-10-CM | POA: Diagnosis not present

## 2019-05-08 DIAGNOSIS — I5033 Acute on chronic diastolic (congestive) heart failure: Secondary | ICD-10-CM | POA: Diagnosis not present

## 2019-05-08 DIAGNOSIS — M109 Gout, unspecified: Secondary | ICD-10-CM | POA: Diagnosis not present

## 2019-05-13 DIAGNOSIS — I5033 Acute on chronic diastolic (congestive) heart failure: Secondary | ICD-10-CM | POA: Diagnosis not present

## 2019-05-13 DIAGNOSIS — M109 Gout, unspecified: Secondary | ICD-10-CM | POA: Diagnosis not present

## 2019-05-13 DIAGNOSIS — M069 Rheumatoid arthritis, unspecified: Secondary | ICD-10-CM | POA: Diagnosis not present

## 2019-05-13 DIAGNOSIS — I110 Hypertensive heart disease with heart failure: Secondary | ICD-10-CM | POA: Diagnosis not present

## 2019-05-13 DIAGNOSIS — J449 Chronic obstructive pulmonary disease, unspecified: Secondary | ICD-10-CM | POA: Diagnosis not present

## 2019-05-13 DIAGNOSIS — I2510 Atherosclerotic heart disease of native coronary artery without angina pectoris: Secondary | ICD-10-CM | POA: Diagnosis not present

## 2019-05-14 ENCOUNTER — Ambulatory Visit (INDEPENDENT_AMBULATORY_CARE_PROVIDER_SITE_OTHER): Payer: Medicare Other | Admitting: Internal Medicine

## 2019-05-14 ENCOUNTER — Other Ambulatory Visit: Payer: Self-pay

## 2019-05-14 ENCOUNTER — Encounter: Payer: Self-pay | Admitting: Internal Medicine

## 2019-05-14 VITALS — BP 132/82 | HR 62 | Temp 97.8°F | Wt 341.0 lb

## 2019-05-14 DIAGNOSIS — M0579 Rheumatoid arthritis with rheumatoid factor of multiple sites without organ or systems involvement: Secondary | ICD-10-CM | POA: Diagnosis not present

## 2019-05-14 DIAGNOSIS — I5032 Chronic diastolic (congestive) heart failure: Secondary | ICD-10-CM

## 2019-05-14 DIAGNOSIS — F1120 Opioid dependence, uncomplicated: Secondary | ICD-10-CM | POA: Diagnosis not present

## 2019-05-14 DIAGNOSIS — G894 Chronic pain syndrome: Secondary | ICD-10-CM

## 2019-05-14 MED ORDER — TORSEMIDE 20 MG PO TABS: ORAL_TABLET | ORAL | 3 refills | Status: DC

## 2019-05-14 NOTE — Assessment & Plan Note (Signed)
Fluid status seems okay despite weight gain (probably prednisone) EF normal on echo while in hospital Will continue torsemide Cardiology follow up

## 2019-05-14 NOTE — Progress Notes (Signed)
Subjective:    Patient ID: Tina Pacheco, female    DOB: [birth year], 69 y.o.   MRN: [id number]  HPI Here for hospital follow up and review of chronic pain  This visit occurred during the SARS-CoV-2 public health emergency.  Safety protocols were in place, including screening questions prior to the visit, additional usage of staff PPE, and extensive cleaning of exam room while observing appropriate contact time as indicated for disinfecting solutions.   Had generalized joint pains and weakness Diagnosed with flare of RA Went to [facility] Got prednisone burst and now weaning On hydroxychlorquine and leflunomide Had improved but now having increased pain in right shoulder  Also diagnosed with diastolic CHF Is on torsemide Weighing regularly--- has gone up (eating more and "not doing anything")  Walking with rollator Showers and dresses on her own---but trouble with bra due to shoulder She does her laundry Husband doing some cooking---also getting on line sent meals (to be microwaved)  Ongoing chronic back pain Now with other joint pains as well Continues on the oxycodone  Current Outpatient Medications on File Prior to Visit  Medication Sig Dispense Refill  . amLODipine (NORVASC) 5 MG tablet Take 5 mgs by mouth once daily in the evening 90 tablet 3  . atorvastatin (LIPITOR) 20 MG tablet TAKE 1 TABLET DAILY AT 6PM 90 tablet 3  . dabigatran (PRADAXA) 150 MG CAPS capsule Take 1 capsule (150 mg total) by mouth 2 (two) times daily. 180 capsule 1  . diphenhydrAMINE (BENADRYL) 25 MG tablet Take 25 mg by mouth daily as needed for allergies.    . folic acid (FOLVITE) 1 MG tablet Take 1 mg by mouth daily.    Marland Kitchen gabapentin (NEURONTIN) 300 MG capsule Take 1 capsule 3 times a day (Take 1 capsule 2-3 hours before bedtime) 90 capsule 11  . hydroxychloroquine (PLAQUENIL) 200 MG tablet Take 200 mg by mouth 2 (two) times daily.     Marland Kitchen leflunomide (ARAVA) 20 MG tablet Take 20 mg by  mouth every morning.     . meclizine (ANTIVERT) 25 MG tablet Take 1 tablet (25 mg total) by mouth 3 (three) times daily as needed for dizziness. 90 tablet 5  . metoprolol succinate (TOPROL-XL) 50 MG 24 hr tablet TAKE 1 TABLET BY MOUTH EVERY DAY WITH OR IMMEDIATELY FOLLOWING A MEAL (Patient taking differently: Take 50 mg by mouth at bedtime. ) 90 tablet 3  . montelukast (SINGULAIR) 10 MG tablet TAKE 1 TABLET BY MOUTH AT BEDTIME 90 tablet 3  . Multiple Vitamin (MULTIVITAMIN) tablet Take 1 tablet by mouth daily.     . Oxycodone HCl 10 MG TABS Take 2 tablets (20 mg total) by mouth 2 (two) times daily as needed (moderate pain). Patient is taking 2 tablets in the morning and 2 tablets at bedtime 120 tablet 0  . polyethylene glycol (MIRALAX / GLYCOLAX) packet Take 17 g by mouth daily as needed for mild constipation. 14 each 0  . potassium chloride SA (KLOR-CON M20) 20 MEQ tablet TAKE 1/2 TABLET (=10MEQ) TWO TIMES A DAY ; MAY TAKE AND EXTRA TABLET WHEN TAKING TORSEMIDE 180 tablet 3  . predniSONE (DELTASONE) 10 MG tablet Take 1 tablet (10 mg total) by mouth daily with breakfast. 14 tablet 0  . SF 5000 PLUS 1.1 % CREA dental cream Place 1 application onto teeth 2 (two) times daily.  1  . sodium chloride (OCEAN) 0.65 % SOLN nasal spray Place 1 spray into both nostrils as needed for  congestion.    Marland Kitchen terazosin (HYTRIN) 10 MG capsule Take 1 capsule (10 mg total) by mouth at bedtime. 90 capsule 3  . tolterodine (DETROL LA) 2 MG 24 hr capsule TAKE 1 CAPSULE(2 MG) BY MOUTH DAILY AS NEEDED FOR BLADDER SPASMS 30 capsule 5  . torsemide (DEMADEX) 20 MG tablet Take 20 mg by mouth twice daily as needed for swelling 60 tablet 3   No current facility-administered medications on file prior to visit.    Allergies  Allergen Reactions  . Codeine     Nausea and vomiting    Past Medical History:  Diagnosis Date  . Acute CHF (congestive heart failure) ([place]) 03/30/2019  . Arthritis    RA  . Asthma   . Basal cell  carcinoma   . Cataract 2018   bilateral eyes; corrected with surgery  . Claustrophobia   . Collagen vascular disease (HCC)    RA  . COPD (chronic obstructive pulmonary disease) ([place])   . Diastolic dysfunction    a. echo 07/2014: EF 55-60%, no RWMA, GR2DD, mild MR, LA moderately dilated, PASP 38 mm Hg  . Dyslipidemia   . Dysrhythmia   . Headache    migraines  . Hemihypertrophy   . History of cardiac cath    a. 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 History of gout   . Hyperplastic colonic polyp 2003  . Hypertension   . Hypokalemia   . Morbid obesity ([place])   . PAF (paroxysmal atrial fibrillation) (HCC)    a. on Pradaxa; b. CHADSVASc at least 2 (HTN & female)  . Rheumatoid arthritis(714.0)   . Sleep apnea    a. not compliant with CPAP    Past Surgical History:  Procedure Laterality Date  . ABDOMINAL HYSTERECTOMY    . APPLICATION OF WOUND VAC Right 08/09/2017   Procedure: APPLICATION OF WOUND VAC;  Surgeon: Albertine Patricia, DPM;  Location: [facility] ORS;  Service: Podiatry;  Laterality: Right;  . CARDIAC CATHETERIZATION  05/24/2010   nonobstructive CAD  . CATARACT EXTRACTION W/ INTRAOCULAR LENS IMPLANT Right 06/12/2016   Dr. Darleen Crocker  . CATARACT EXTRACTION W/ INTRAOCULAR LENS IMPLANT Left 06/26/2016   Dr. Darleen Crocker  . CESAREAN SECTION    . CHOLECYSTECTOMY    . COLONOSCOPY  06/12/2011   Procedure: COLONOSCOPY;  Surgeon: Juanita Craver, MD;  Location: [facility];  Service: Endoscopy;  Laterality: N/A;  . COLONOSCOPY N/A 03/17/2013   Procedure: COLONOSCOPY;  Surgeon: Juanita Craver, MD;  Location: [facility] ENDOSCOPY;  Service: Endoscopy;  Laterality: N/A;  . EYE SURGERY    . FOOT ARTHRODESIS Right 07/13/2017   Procedure: ARTHRODESIS FOOT-MULTI.FUSIONS (6 JOINTS);  Surgeon: Albertine Patricia, DPM;  Location: [facility] ORS;  Service: Podiatry;  Laterality: Right;  . IRRIGATION AND DEBRIDEMENT FOOT Right 08/09/2017   Procedure: IRRIGATION AND DEBRIDEMENT FOOT;  Surgeon: Albertine Patricia,  DPM;  Location: [facility] ORS;  Service: Podiatry;  Laterality: Right;  . KNEE ARTHROSCOPY     bilateral  . SINUSOTOMY    . TOOTH EXTRACTION  12/2016  . VAGINAL HYSTERECTOMY      Family History  Problem Relation Age of Onset  . Emphysema Father        smoked  . Tuberculosis Mother   . Parkinsonism Mother   . Diabetes type II Sister   . Breast cancer Sister   . Breast cancer Maternal Aunt   . Tuberculosis Sister     Social History   Socioeconomic History  . Marital status: Married  Spouse name: Not on file  . Number of children: 1  . Years of education: Not on file  . Highest education level: Not on file  Occupational History  . 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 Smoking status: Never Smoker  . Smokeless tobacco: Never Used  Substance and Sexual Activity  . Alcohol use: No  . Drug use: No  . Sexual activity: Not on file  Other Topics Concern  . Not on file  Social History Narrative   No living will   Husband, then daughter should be decision maker   Would accept resuscitation attempts   Not sure about tube feeds   Social Determinants of Health   Financial Resource Strain:   . Difficulty of Paying Living Expenses:   Food Insecurity: No Food Insecurity  . Worried About Charity fundraiser in the Last Year: Never true  . Ran Out of Food in the Last Year: Never true  Transportation Needs: No Transportation Needs  . Lack of Transportation (Medical): No  . Lack of Transportation (Non-Medical): No  Physical Activity:   . Days of Exercise per Week:   . Minutes of Exercise per Session:   Stress:   . Feeling of Stress :   Social Connections:   . Frequency of Communication with Friends and Family:   . Frequency of Social Gatherings with Friends and Family:   . Attends Religious Services:   . Active Member of Clubs or Organizations:   . Attends Archivist Meetings:   Marland Kitchen Marital Status:   Intimate Partner  Violence:   . Fear of Current or Ex-Partner:   . Emotionally Abused:   Marland Kitchen Physically Abused:   . Sexually Abused:    Review of Systems Still has to sleep in a recliner Bowels are moving okay    Objective:   Physical Exam  Constitutional: She appears well-developed. No distress.  Neck: No thyromegaly present.  Cardiovascular: Normal rate, regular rhythm and normal heart sounds. Exam reveals no gallop.  No murmur heard. Respiratory: Effort normal and breath sounds normal. No respiratory distress. She has no wheezes. She has no rales.  Musculoskeletal:     Comments: Calves and thighs very thick--but no clear edema  Lymphadenopathy:    She has no cervical adenopathy.  Psychiatric: She has a normal mood and affect. Her behavior is normal.           Assessment & Plan:

## 2019-05-14 NOTE — Assessment & Plan Note (Signed)
This has worsened due to the RA Continues on the oxycodone

## 2019-05-14 NOTE — Assessment & Plan Note (Signed)
Not clear if shoulder pain is exacerbation with prednisone wean (not typical) On dual therapy Seeing Dr Meda Coffee

## 2019-05-14 NOTE — Assessment & Plan Note (Signed)
PDMP reviewed No concerns 

## 2019-05-15 ENCOUNTER — Telehealth: Payer: Self-pay | Admitting: Cardiovascular Disease

## 2019-05-15 DIAGNOSIS — J449 Chronic obstructive pulmonary disease, unspecified: Secondary | ICD-10-CM | POA: Diagnosis not present

## 2019-05-15 DIAGNOSIS — M069 Rheumatoid arthritis, unspecified: Secondary | ICD-10-CM | POA: Diagnosis not present

## 2019-05-15 DIAGNOSIS — I480 Paroxysmal atrial fibrillation: Secondary | ICD-10-CM | POA: Diagnosis not present

## 2019-05-15 DIAGNOSIS — Z9181 History of falling: Secondary | ICD-10-CM | POA: Diagnosis not present

## 2019-05-15 DIAGNOSIS — M109 Gout, unspecified: Secondary | ICD-10-CM | POA: Diagnosis not present

## 2019-05-15 DIAGNOSIS — E782 Mixed hyperlipidemia: Secondary | ICD-10-CM | POA: Diagnosis not present

## 2019-05-15 DIAGNOSIS — G4730 Sleep apnea, unspecified: Secondary | ICD-10-CM | POA: Diagnosis not present

## 2019-05-15 DIAGNOSIS — I110 Hypertensive heart disease with heart failure: Secondary | ICD-10-CM | POA: Diagnosis not present

## 2019-05-15 DIAGNOSIS — I2510 Atherosclerotic heart disease of native coronary artery without angina pectoris: Secondary | ICD-10-CM | POA: Diagnosis not present

## 2019-05-15 DIAGNOSIS — I5033 Acute on chronic diastolic (congestive) heart failure: Secondary | ICD-10-CM | POA: Diagnosis not present

## 2019-05-15 NOTE — Telephone Encounter (Signed)
Spoke with patient. Patient taking prednisone 10 mg daily for arthritis flare for a few weeks. States she has been eating too much which is not helping with her weight gain. Admits to not taking Torsemide everyday due to the inconvenience of voiding while out of the house.  Swelling is generalized. SOB is present but the same as usual for her. Weight is normally around 316 last week and now is 336. She again said she has not been eating the way she should to prevent this.  Torsemide is prescribed as 20 mg two times a day as needed for swelling. Advised patient to take Torsemide 20 mg two times a day for the next 1-2 days, work on eating appropriately, continue daily weights. Moved her appointment for Monday, 05/20/19, with Laurann [place], NP. Advised patient to call tomorrow or the on call if weight does not improve over the next day or so for further advice. She was appreciative. Routing to Dr Rockey Situ for further recommendations.

## 2019-05-15 NOTE — Telephone Encounter (Signed)
Pt c/o swelling: STAT is pt has developed SOB within 24 hours  1) How much weight have you gained and in what time span? ~20 pounds in a week   2) If swelling, where is the swelling located? All over  3) Are you currently taking a fluid pill? Yes, torsemide  4) Are you currently SOB? Yes, but not more than usual   5) Do you have a log of your daily weights (if so, list)?  Last week 316 to 321 now up 336  6) Have you gained 3 pounds in a day or 5 pounds in a week? yes  7) Have you traveled recently?

## 2019-05-16 ENCOUNTER — Other Ambulatory Visit: Payer: Self-pay | Admitting: *Deleted

## 2019-05-16 ENCOUNTER — Encounter: Payer: Self-pay | Admitting: *Deleted

## 2019-05-16 NOTE — Patient Outreach (Signed)
[facility]) Care Management [place], Transition of Care day 31 Post- SNF discharge day # 23- without hospital re-admission  05/16/2019  ALEATHIA PURDY 07/19/1950 [id number]  Successful telephone outreach to [name], 69 y/o female referred initially to [facility] RN CM for transition of care after recent hospitalization January 31- April 04, 2019 for weakness, multiple falls at home, shortness of breath/ acute on chronic CHF. Patient was discharged to SNF for rehabilitation and was subsequently discharged from SNF to home on April 10, 2019 with home health services.   Patient has history including, but not limited to, morbid obesity; HTN/ HLD; chronic pain related to osteoarthritis/ rheumatoid arthritis; ongoing use of narcotics; p- Atrial fibrillation; CHF; and COPD.  HIPAA/ identity verified. Patient reports "doingokay," and confirms that shecontinues participating in home health services and tolerating well; states that she expects home health team to sign off in the coming days, as she has made good progress with PT sessions- positive reinforcement provided.  Reports baseline chronic pain and denies new/ recent falls.  Sounds to be in no distress throughout call today.  Patient reports she attended PCP office visit 05/14/19; acknowledges that she has been experiencing weight gain over several weeks; states that PCP evaluated and assessed, and believes her weight gain is related to ongoing prednisone therapy for chronic arthritis pain.  Patient remains frustrated that she is not progressing quicker through her recuperation.  Confirms that she plans to attend cardiology appointment scheduled for Monday 05/19/19; confirms husband will continue to provide transportation.   Patient further reports:  -- has and is taking all medications as prescribed; denies new/ recent changes to medications; continues to self- manage medications; reports has  been taking diuretic for ongoing weight gain; plans to discuss with cardiologist at time of upcoming scheduled office visit- encouraged patient to take all of weights recorded at home to visit with her for provider evaluation- patient verbalizes agreement  -- continues monitoring/ recording daily weights at home: previous reported weight ranges at home initially between 316-320 lbs; gradually increased to "321- 323 lbs;" with ongoing gain-- reported weight this morning of "336 lbs;" confirms that PCP is aware and addressed as noted above during office visit 05/14/19 ---- Denies other signs/ symptoms yellow CHF zone-- positive reinforcement provided for patient following established action plan for weight gain.  Using teach back method, again reviewed with patient previously provided education around action plan for CHF yellow zone and importance of daily weights at home; patient remains unable to independently verbalize all signs/ symptoms fluid weight gain, and we went over this numerous times today; patient reported physically writing these signs/ symptoms down to help her memorize; action plan for all CHF zones were reiterated with patient using teach back method.  Encouraged patient to review which "zone" she is in daily when she obtains her morning weight.  Patient denies further issues, concerns, or problems today. Iconfirmed that patient hasmy direct phone number, the main [facility] CM office phone number, and the [facility] CM 24-hour nurse advice phone number should issues arise prior to next scheduled [facility] CM outreachwithin 2-3 weeks, or sooner if indicated.Encouraged patient to contact me directly if needs, questions, issues, or concerns arise prior to next scheduled outreach; patient agreed to do so.  Plan:  Patient will take medications as prescribed and will attend all scheduled provider appointments  Patient will promptly notify care providers for any new concerns/ issues/ problems that  arise  Patient will actively participate  in home health services as ordered post-hospital discharge, as long as these services are in place  Patient willcontinuemonitoring/ recording daily weights and will follow established action plan for weight gain  Patient will review signs/ symptoms CHF zones daily  [facility] CM outreachto continue with scheduled phone call in 2-3 weeks, unless indicated sooner  [facility] CM Care Plan Problem One     Most Recent Value  Care Plan Problem One  High Risk for hospital re-admission related to/ as evidenced by recent hospitalization for CHF exacerbation January 31- April 04, 2019 with SNF rehabilitation visit  Role Documenting the Problem One  Care Management Coordinator  Care Plan for Problem One  Not Active  [facility] Long Term Goal   Over the next 31 days, patient will not experience unplanned hospital re- admission as evidenced by patient reporting and review of EMR during [facility] RN CM outreach  [place] Long Term Goal Start Date  04/16/19  [facility] Long Term Goal Met Date  05/16/19 [facility] met]  Interventions for Problem One Long Term Goal  Discussed with patient current clinical condition and her concerns with ongoing weight gain due to prednisone therapy for arthritis: provided positive reinforcement for contacting care providers promptly and reviewed with patient this week's PCP appointment and post-office visit instructions,  confirmed that patient understands to attend upcoming scheduled cardiology provider office visit.  Using teachback method, reviewed previously provided education around signs/ symptoms CHF zone, weight gain guidelines in setting of CHF and corresponding action plan for both signs/ symptoms and weight gain due to fluid  [facility] CM Short Term Goal #1   Over the next 30 days, patient will continue participating in home health services for PT as evidenced by patient reportating and collaboration with home health team as indicated during [facility] RN CM outreach   [facility] CM Short Term Goal #1 Start Date  04/16/19  [facility] CM Short Term Goal #1 Met Date  05/16/19 [Goal met]  Interventions for Short Term Goal #1  Confirmed that patient has continued to actively participate in home health PT sevrices,  discussed upcoming discharge plan and provided positive reinforcement for the progress patient has made  [facility] CM Short Term Goal #3  Over the next 23 days, patient will monitor and record daily weights at home, as evidenced by patient reporting/ review of same during [facility] RN CM outreach  [facility] CM Short Term Goal #3 Start Date  04/23/19  [facility] CM Short Term Goal #3 Met Date  05/16/19 [Goal Met]  Interventions for Short Tern Goal #3  Confirmed that patient has continued monitoring and recording daily weights at home and encouraged her to continue to do so,  reviewed recent daily weights at home and at PCP appointment- discussed other signs/ symptoms associated with yellow CHF zone to help patient differentiate between fluid weight gain and prednisone therapy weight gain    [facility] CM Care Plan Problem Two     Most Recent Value  Care Plan Problem Two  Need for ongoing reinforcement of self-health management strategies of chronic disease state of CHF, as evidenced by patient reporting  Role Documenting the Problem Two  Care Management Coordinator  Care Plan for Problem Two  Active  Interventions for Problem Two Long Term Goal   Interventions as noted above-- care plan for self-health management of CHF re-established today and previously provided education was reiterated with patient totday using teach back method  [facility] Long Term Goal  Over the next 60 days, patient will monitor  and record daily weights at home as evidenced by review of same with patient during [place] outreach  [place] Term Goal Start Date  05/16/19  [facility] CM Short Term Goal #1   Over the next 30 days, patient will independently verbalize signs/ symptoms CHF yellow zone along with corresponding action plan, as  evidenced by patient reporting during [id number] RN CM outreach  [facility] CM Short Term Goal #1 Start Date  05/16/19  Interventions for Short Term Goal #2   Using teach back method, re-introduced concept/ signs/ symptoms of stop-light zones for CHF and corresponding action plans for each zone,  encouraged patient to review her personal zones daily when monitoring daily weights     Oneta Rack, RN, BSN, [place] Coordinator [facility] Care Management  [phone]

## 2019-05-17 LAB — PAIN MGMT, PROFILE 8 W/CONF, U
6 Acetylmorphine: NEGATIVE ng/mL
Alcohol Metabolites: NEGATIVE ng/mL (ref <500)
Amphetamines: NEGATIVE ng/mL
Benzodiazepines: NEGATIVE ng/mL
Buprenorphine, Urine: NEGATIVE ng/mL
Buprenorphine: NEGATIVE ng/mL
Cocaine Metabolite: NEGATIVE ng/mL
Codeine: NEGATIVE ng/mL
Creatinine: 135.8 mg/dL
Hydrocodone: NEGATIVE ng/mL
Hydromorphone: NEGATIVE ng/mL
MDMA: NEGATIVE ng/mL
Marijuana Metabolite: NEGATIVE ng/mL
Morphine: NEGATIVE ng/mL
Norbuprenorphine: NEGATIVE ng/mL
Norhydrocodone: NEGATIVE ng/mL
Noroxycodone: 7635 ng/mL
Opiates: NEGATIVE ng/mL
Oxidant: NEGATIVE ug/mL
Oxycodone: 5887 ng/mL
Oxycodone: POSITIVE ng/mL
Oxymorphone: 1619 ng/mL
pH: 5.3 (ref 4.5–9.0)

## 2019-05-18 NOTE — Progress Notes (Signed)
Office Visit    Patient Name: Tina Pacheco Date of Encounter: 05/19/2019  Primary Care Provider:  Venia Carbon, MD Primary Cardiologist:  Ida Rogue, MD Electrophysiologist:  None   Chief Complaint    Tina Pacheco is a 69 y.o. female with a hx of diastolic heart failure, HLD, HTN, rheumatoid arthritis, PAF on Pradaxa, CAD, COPD, falls, morbid obesity presents today for LE edema and weight gain   Past Medical History    Past Medical History:  Diagnosis Date  . Acute CHF (congestive heart failure) ([place]) 03/30/2019  . Arthritis    RA  . Asthma   . Basal cell carcinoma   . Cataract 2018   bilateral eyes; corrected with surgery  . Claustrophobia   . Collagen vascular disease (HCC)    RA  . COPD (chronic obstructive pulmonary disease) ([place])   . Diastolic dysfunction    a. echo 07/2014: EF 55-60%, no RWMA, GR2DD, mild MR, LA moderately dilated, PASP 38 mm Hg  . Dyslipidemia   . Dysrhythmia   . Headache    migraines  . Hemihypertrophy   . History of cardiac cath    a. 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 History of gout   . Hyperplastic colonic polyp 2003  . Hypertension   . Hypokalemia   . Morbid obesity ([place])   . PAF (paroxysmal atrial fibrillation) (HCC)    a. on Pradaxa; b. CHADSVASc at least 2 (HTN & female)  . Rheumatoid arthritis(714.0)   . Sleep apnea    a. not compliant with CPAP   Past Surgical History:  Procedure Laterality Date  . ABDOMINAL HYSTERECTOMY    . APPLICATION OF WOUND VAC Right 08/09/2017   Procedure: APPLICATION OF WOUND VAC;  Surgeon: Albertine Patricia, DPM;  Location: [facility] ORS;  Service: Podiatry;  Laterality: Right;  . CARDIAC CATHETERIZATION  05/24/2010   nonobstructive CAD  . CATARACT EXTRACTION W/ INTRAOCULAR LENS IMPLANT Right 06/12/2016   Dr. Darleen Crocker  . CATARACT EXTRACTION W/ INTRAOCULAR LENS IMPLANT Left 06/26/2016   Dr. Darleen Crocker  . CESAREAN SECTION    . CHOLECYSTECTOMY    . COLONOSCOPY   06/12/2011   Procedure: COLONOSCOPY;  Surgeon: Juanita Craver, MD;  Location: [facility];  Service: Endoscopy;  Laterality: N/A;  . COLONOSCOPY N/A 03/17/2013   Procedure: COLONOSCOPY;  Surgeon: Juanita Craver, MD;  Location: [facility] ENDOSCOPY;  Service: Endoscopy;  Laterality: N/A;  . EYE SURGERY    . FOOT ARTHRODESIS Right 07/13/2017   Procedure: ARTHRODESIS FOOT-MULTI.FUSIONS (6 JOINTS);  Surgeon: Albertine Patricia, DPM;  Location: [facility] ORS;  Service: Podiatry;  Laterality: Right;  . IRRIGATION AND DEBRIDEMENT FOOT Right 08/09/2017   Procedure: IRRIGATION AND DEBRIDEMENT FOOT;  Surgeon: Albertine Patricia, DPM;  Location: [facility] ORS;  Service: Podiatry;  Laterality: Right;  . KNEE ARTHROSCOPY     bilateral  . SINUSOTOMY    . TOOTH EXTRACTION  12/2016  . VAGINAL HYSTERECTOMY      Allergies  Allergies  Allergen Reactions  . Codeine     Nausea and vomiting    History of Present Illness    Tina Pacheco is a 69 y.o. female with a hx of diastolic heart failure, HLD, HTN, rheumatoid arthritis, PAF on Pradaxa, CAD, COPD, falls, morbid obesity last seen 05/27/18 by Dr. Rockey Situ.  Diagnosed with PAF in 2012, anticoagulated with Pradaxa. Cardiac cath 2012 with mild nonobstructive CAD. Echo 2016 LVEF 55-60%, no RWMA, gr2DD, mild MR.   She was admitted  03/30/19-04/04/19 for weakness, multiple falls at home, SOB, and acute on chronic CHF exacerbation. Declined head CT while hospitalized. Echo 04/01/19 LVEF 55-60%, mild LVH, gr1DD, no RWMA, LA mild-moderately dilated, moderate MV calcification without regurgitation or stenosis.  She was discharged to SNF and subsequently returned home 04/10/19 with home health.   She called the office 05/15/19 noting weight gain and swelling, but no shortness of breath. She has been on prednisone 10mg  daily for arthritis. Told nurse she was not taking Torsemide everyday as prescribed and not following a heart healthy diet.  Tells me her weight 317/21 on her home scale was 337 pounds.   Her weight today and her holds at home scale was 325 pounds.  Weight loss of 12 pounds over 5 days.  Tells me she took her torsemide 2 days over the weekend.  We discussed that both gabapentin and prednisone can contribute to her fluid retention as well as dietary indiscretion.  She routinely elevates her lower extremity when sitting.  Does eat high sodium foods such as potato chips.  Endorses understanding of need for a low-sodium diet.  Reports no shortness of breath at rest.  Does endorse dyspnea on exertion.  Tells me this is at her baseline.  Continues to work with home PT.  No chest pain, pressure, tightness.  No orthopnea nor PND.  No lightheadedness, dizziness, near syncope.   EKGs/Labs/Other Studies Reviewed:   The following studies were reviewed today: Echo 04/01/19 1. Left ventricular ejection fraction, by visual estimation, is 55 to  60%. The left ventricle has normal function. There is mildly increased  left ventricular hypertrophy.   2. Definity contrast agent was given IV to delineate the left ventricular  endocardial borders.   3. Left ventricular diastolic parameters are consistent with Grade I  diastolic dysfunction (impaired relaxation).   4. The left ventricle has no regional wall motion abnormalities.   5. Global right ventricle has normal systolic function.The right  ventricular size is normal. No increase in right ventricular wall  thickness.   6. Left atrial size was mild-moderately dilated.   7. Right atrial size was normal.   8. Moderate mitral annular calcification.   9. The mitral valve is normal in structure. No evidence of mitral valve  regurgitation. No evidence of mitral stenosis.  10. The tricuspid valve is normal in structure. Tricuspid valve  regurgitation is not demonstrated.  11. The aortic valve is normal in structure. Aortic valve regurgitation is  not visualized. Mild to moderate aortic valve sclerosis/calcification  without any evidence of  aortic stenosis.  12. The pulmonic valve was normal in structure. Pulmonic valve  regurgitation is trivial.  13. TR signal is inadequate for assessing pulmonary artery systolic  pressure.   EKG:  EKG is ordered today.  The ekg ordered today demonstrates SR 63 bpm with no acute ST/T wave changes.   Recent Labs: 03/30/2019: ALT 21; B Natriuretic Peptide 285.0 04/04/2019: BUN 23; Creatinine, Ser 0.71; Hemoglobin 12.5; Platelets 275; Potassium 4.0; Sodium 139  Recent Lipid Panel    Component Value Date/Time   CHOL 152 12/14/2015 1009   TRIG 98.0 12/14/2015 1009   HDL 37.60 (L) 12/14/2015 1009   CHOLHDL 4 12/14/2015 1009   VLDL 19.6 12/14/2015 1009   LDLCALC 95 12/14/2015 1009    Home Medications   Current Meds  Medication Sig  . amLODipine (NORVASC) 5 MG tablet Take 5 mgs by mouth once daily in the evening  . atorvastatin (LIPITOR) 20 MG tablet TAKE  1 TABLET DAILY AT 6PM  . dabigatran (PRADAXA) 150 MG CAPS capsule Take 1 capsule (150 mg total) by mouth 2 (two) times daily.  . diphenhydrAMINE (BENADRYL) 25 MG tablet Take 25 mg by mouth daily as needed for allergies.  . folic acid (FOLVITE) 1 MG tablet Take 1 mg by mouth daily.  Marland Kitchen gabapentin (NEURONTIN) 300 MG capsule Take 1 capsule 3 times a day (Take 1 capsule 2-3 hours before bedtime)  . hydroxychloroquine (PLAQUENIL) 200 MG tablet Take 200 mg by mouth 2 (two) times daily.   Marland Kitchen leflunomide (ARAVA) 20 MG tablet Take 20 mg by mouth every morning.   . meclizine (ANTIVERT) 25 MG tablet Take 1 tablet (25 mg total) by mouth 3 (three) times daily as needed for dizziness.  . metoprolol succinate (TOPROL-XL) 50 MG 24 hr tablet TAKE 1 TABLET BY MOUTH EVERY DAY WITH OR IMMEDIATELY FOLLOWING A MEAL (Patient taking differently: Take 50 mg by mouth at bedtime. )  . montelukast (SINGULAIR) 10 MG tablet TAKE 1 TABLET BY MOUTH AT BEDTIME  . Multiple Vitamin (MULTIVITAMIN) tablet Take 1 tablet by mouth daily.   . Oxycodone HCl 10 MG TABS Take 2 tablets  (20 mg total) by mouth 2 (two) times daily as needed (moderate pain). Patient is taking 2 tablets in the morning and 2 tablets at bedtime  . polyethylene glycol (MIRALAX / GLYCOLAX) packet Take 17 g by mouth daily as needed for mild constipation.  . potassium chloride SA (KLOR-CON M20) 20 MEQ tablet TAKE 1/2 TABLET (=10MEQ) TWO TIMES A DAY ; MAY TAKE AND EXTRA TABLET WHEN TAKING TORSEMIDE  . predniSONE (DELTASONE) 10 MG tablet Take 1 tablet (10 mg total) by mouth daily with breakfast.  . SF 5000 PLUS 1.1 % CREA dental cream Place 1 application onto teeth 2 (two) times daily.  . sodium chloride (OCEAN) 0.65 % SOLN nasal spray Place 1 spray into both nostrils as needed for congestion.  Marland Kitchen terazosin (HYTRIN) 10 MG capsule Take 1 capsule (10 mg total) by mouth at bedtime.  . tolterodine (DETROL LA) 2 MG 24 hr capsule TAKE 1 CAPSULE(2 MG) BY MOUTH DAILY AS NEEDED FOR BLADDER SPASMS  . torsemide (DEMADEX) 20 MG tablet Take 20 mg by mouth twice daily as needed for swelling (Patient taking differently: Take 20 mg by mouth daily as needed. Take 20 mg by mouth twice daily as needed for swelling)      Review of Systems    Review of Systems  Constitution: Positive for malaise/fatigue. Negative for chills and fever.  Cardiovascular: Positive for dyspnea on exertion and leg swelling. Negative for chest pain, irregular heartbeat, near-syncope, orthopnea, palpitations and syncope.  Respiratory: Negative for cough, shortness of breath and wheezing.   Gastrointestinal: Negative for melena, nausea and vomiting.  Genitourinary: Negative for hematuria.  Neurological: Negative for dizziness, light-headedness and weakness.   All other systems reviewed and are otherwise negative except as noted above.  Physical Exam    VS:  BP 130/70 (BP Location: Left Arm, Patient Position: Sitting, Cuff Size: Large)   Pulse 63   Ht 5\' 3"  (1.6 m)   Wt (!) 329 lb 2 oz (149.3 kg)   SpO2 95%   BMI 58.30 kg/m  , BMI Body mass  index is 58.3 kg/m. GEN: Well nourished, morbidly obese, well developed, in no acute distress. HEENT: normal. Neck: Supple, no JVD, carotid bruits, or masses. Cardiac: RRR, no murmurs, rubs, or gallops. No clubbing, cyanosis.  Nonpitting bilateral ankle edema.  Radials/DP/PT 2+ and equal bilaterally.  Respiratory:  Respirations regular and unlabored, clear to auscultation bilaterally.  Lung sounds diminished in the bases likely due to body habitus. GI: Soft, nontender, nondistended, BS + x 4. MS: No deformity or atrophy. Skin: Warm and dry, no rash. Neuro:  Strength and sensation are intact. Psych: Normal affect.  Accessory Clinical Findings    ECG personally reviewed by me today -sinus rhythm 63 bpm no acute ST/T wave changes.- no acute changes.  Assessment & Plan    1. Diastolic heart failure -NYHA II symptoms.  Echo 03/31/2019 with LVEF 55 to 60%, mild LVH, grade 1 diastolic dysfunction.  Dyspnea on exertion likely multifactorial COPD, deconditioning, obesity, diastolic heart failure.  Her weight is down 12 pounds over the last 5 days by home scale.  She takes her torsemide only as needed and does not like to take very often.  Recommended she take every other day in particular for weight gain of 3 pounds overnight or 5 pounds in 1 week.  We discussed that her prednisone and dietary indiscretion are likely contributing to fluid retention.  Heart failure lifestyle changes education provided.  2. PAF/Chronic anticoagulation -maintaining normal sinus rhythm on metoprolol.  Continue Pradaxa for anticoagulation.  CHA2DS2-VASc of at least 4.  Denies bleeding complications.  3. Mild nonobstructive CAD - By cardiac cath 2012.  No anginal symptoms-no indication for ischemic evaluation at this time.  Guideline directed therapy includes beta-blocker, statin.  No aspirin secondary to chronic anticoagulation.  4. HTN -blood pressure well controlled.  Continue present antihypertensive regimen including  Toprol 50 mg daily.  5. HLD -follows with primary care.  Continue atorvastatin 20 mg daily.  6. COPD -follows with her primary care provider.  May benefit from maintenance inhaler.  Discussed pursed lip breathing.  7. Rheumatoid arthritis -follows with rheumatology.  Presently on prednisone 10 mg daily, Plaquenil.  Discussed role that prednisone could have on edema.  8. Morbid obesity -weight loss via diet and exercise encouraged.  We discussed that due to her rheumatoid arthritis and difficulties with physical activities much of her weight loss will have to come from dietary changes.  Disposition: Follow up in 3 month(s) with Dr. Rockey Situ or APP   Loel Dubonnet, NP 05/19/2019, 10:14 AM

## 2019-05-19 ENCOUNTER — Encounter: Payer: Self-pay | Admitting: Family

## 2019-05-19 ENCOUNTER — Other Ambulatory Visit: Payer: Self-pay

## 2019-05-19 ENCOUNTER — Ambulatory Visit (INDEPENDENT_AMBULATORY_CARE_PROVIDER_SITE_OTHER): Payer: Medicare Other | Admitting: Family

## 2019-05-19 VITALS — BP 130/70 | HR 63 | Ht 63.0 in | Wt 329.1 lb

## 2019-05-19 DIAGNOSIS — M1990 Unspecified osteoarthritis, unspecified site: Secondary | ICD-10-CM

## 2019-05-19 DIAGNOSIS — I480 Paroxysmal atrial fibrillation: Secondary | ICD-10-CM

## 2019-05-19 DIAGNOSIS — I2510 Atherosclerotic heart disease of native coronary artery without angina pectoris: Secondary | ICD-10-CM | POA: Diagnosis not present

## 2019-05-19 DIAGNOSIS — I5032 Chronic diastolic (congestive) heart failure: Secondary | ICD-10-CM | POA: Diagnosis not present

## 2019-05-19 DIAGNOSIS — Z7901 Long term (current) use of anticoagulants: Secondary | ICD-10-CM

## 2019-05-19 DIAGNOSIS — J449 Chronic obstructive pulmonary disease, unspecified: Secondary | ICD-10-CM

## 2019-05-19 DIAGNOSIS — E782 Mixed hyperlipidemia: Secondary | ICD-10-CM

## 2019-05-19 DIAGNOSIS — G894 Chronic pain syndrome: Secondary | ICD-10-CM

## 2019-05-19 DIAGNOSIS — I10 Essential (primary) hypertension: Secondary | ICD-10-CM

## 2019-05-19 MED ORDER — OXYCODONE HCL 10 MG PO TABS: 20.0000 mg | ORAL_TABLET | Freq: Two times a day (BID) | ORAL | 0 refills | Status: DC | PRN

## 2019-05-19 NOTE — Patient Instructions (Addendum)
Medication Instructions:  No medication changes today.  Would recommend taking your Torsemide about every other day or three times per week.  *If you need a refill on your cardiac medications before your next appointment, please call your pharmacy*  Lab Work: None ordered today.   Testing/Procedures: Your EKG today showed normal sinus rhythm which is a good result!   Follow-Up: At [facility], you and your health needs are our priority.  As part of our continuing mission to provide you with exceptional heart care, we have created designated Provider Care Teams.  These Care Teams include your primary Cardiologist (physician) and Advanced Practice Providers (APPs -  Physician Assistants and Nurse Practitioners) who all work together to provide you with the care you need, when you need it.  We recommend signing up for the patient portal called "MyChart".  Sign up information is provided on this After Visit Summary.  MyChart is used to connect with patients for Virtual Visits (Telemedicine).  Patients are able to view lab/test results, encounter notes, upcoming appointments, etc.  Non-urgent messages can be sent to your provider as well.   To learn more about what you can do with MyChart, go to [email or web address].    Your next appointment:   3 month(s)  The format for your next appointment:   In Person  Provider:    You may see Ida Rogue, MD or one of the following Advanced Practice Providers on your designated Care Team:    Murray Hodgkins, NP  Christell Faith, PA-C  Marrianne Mood, PA-C  Other Instructions  Recommend checking your weight daily. If you gain 3lbs overnight or 5 lbs in a week, would recommend taking your Torsemide.  Recommend elevating your lower extremities when sitting.   Recommend low salt diet.    Low-Sodium Eating Plan Sodium, which is an element that makes up salt, helps you maintain a healthy balance of fluids in your body. Too much sodium  can increase your blood pressure and cause fluid and waste to be held in your body. Your health care provider or dietitian may recommend following this plan if you have high blood pressure (hypertension), kidney disease, liver disease, or heart failure. Eating less sodium can help lower your blood pressure, reduce swelling, and protect your heart, liver, and kidneys. What are tips for following this plan? General guidelines  Most people on this plan should limit their sodium intake to 1,500-2,000 mg (milligrams) of sodium each day. Reading food labels   The Nutrition Facts label lists the amount of sodium in one serving of the food. If you eat more than one serving, you must multiply the listed amount of sodium by the number of servings.  Choose foods with less than 140 mg of sodium per serving.  Avoid foods with 300 mg of sodium or more per serving. Shopping  Look for lower-sodium products, often labeled as "low-sodium" or "no salt added."  Always check the sodium content even if foods are labeled as "unsalted" or "no salt added".  Buy fresh foods. ? Avoid canned foods and premade or frozen meals. ? Avoid canned, cured, or processed meats  Buy breads that have less than 80 mg of sodium per slice. Cooking  Eat more home-cooked food and less restaurant, buffet, and fast food.  Avoid adding salt when cooking. Use salt-free seasonings or herbs instead of table salt or sea salt. Check with your health care provider or pharmacist before using salt substitutes.  Cook with plant-based oils, such as canola,  sunflower, or olive oil. Meal planning  When eating at a restaurant, ask that your food be prepared with less salt or no salt, if possible.  Avoid foods that contain MSG (monosodium glutamate). MSG is sometimes added to [place] food, bouillon, and some canned foods. What foods are recommended? The items listed may not be a complete list. Talk with your dietitian about what dietary  choices are best for you. Grains Low-sodium cereals, including oats, puffed wheat and rice, and shredded wheat. Low-sodium crackers. Unsalted rice. Unsalted pasta. Low-sodium bread. Whole-grain breads and whole-grain pasta. Vegetables Fresh or frozen vegetables. "No salt added" canned vegetables. "No salt added" tomato sauce and paste. Low-sodium or reduced-sodium tomato and vegetable juice. Fruits Fresh, frozen, or canned fruit. Fruit juice. Meats and other protein foods Fresh or frozen (no salt added) meat, poultry, seafood, and fish. Low-sodium canned tuna and salmon. Unsalted nuts. Dried peas, beans, and lentils without added salt. Unsalted canned beans. Eggs. Unsalted nut butters. Dairy Milk. Soy milk. Cheese that is naturally low in sodium, such as ricotta cheese, fresh mozzarella, or Swiss cheese Low-sodium or reduced-sodium cheese. Cream cheese. Yogurt. Fats and oils Unsalted butter. Unsalted margarine with no trans fat. Vegetable oils such as canola or olive oils. Seasonings and other foods Fresh and dried herbs and spices. Salt-free seasonings. Low-sodium mustard and ketchup. Sodium-free salad dressing. Sodium-free light mayonnaise. Fresh or refrigerated horseradish. Lemon juice. Vinegar. Homemade, reduced-sodium, or low-sodium soups. Unsalted popcorn and pretzels. Low-salt or salt-free chips. What foods are not recommended? The items listed may not be a complete list. Talk with your dietitian about what dietary choices are best for you. Grains Instant hot cereals. Bread stuffing, pancake, and biscuit mixes. Croutons. Seasoned rice or pasta mixes. Noodle soup cups. Boxed or frozen macaroni and cheese. Regular salted crackers. Self-rising flour. Vegetables Sauerkraut, pickled vegetables, and relishes. Olives. [place] fries. Onion rings. Regular canned vegetables (not low-sodium or reduced-sodium). Regular canned tomato sauce and paste (not low-sodium or reduced-sodium). Regular tomato and  vegetable juice (not low-sodium or reduced-sodium). Frozen vegetables in sauces. Meats and other protein foods Meat or fish that is salted, canned, smoked, spiced, or pickled. Bacon, ham, sausage, hotdogs, corned beef, chipped beef, packaged lunch meats, salt pork, jerky, pickled herring, anchovies, regular canned tuna, sardines, salted nuts. Dairy Processed cheese and cheese spreads. Cheese curds. Blue cheese. Feta cheese. String cheese. Regular cottage cheese. Buttermilk. Canned milk. Fats and oils Salted butter. Regular margarine. Ghee. Bacon fat. Seasonings and other foods Onion salt, garlic salt, seasoned salt, table salt, and sea salt. Canned and packaged gravies. Worcestershire sauce. Tartar sauce. Barbecue sauce. Teriyaki sauce. Soy sauce, including reduced-sodium. Steak sauce. Fish sauce. Oyster sauce. Cocktail sauce. Horseradish that you find on the shelf. Regular ketchup and mustard. Meat flavorings and tenderizers. Bouillon cubes. Hot sauce and Tabasco sauce. Premade or packaged marinades. Premade or packaged taco seasonings. Relishes. Regular salad dressings. Salsa. Potato and tortilla chips. Corn chips and puffs. Salted popcorn and pretzels. Canned or dried soups. Pizza. Frozen entrees and pot pies. Summary  Eating less sodium can help lower your blood pressure, reduce swelling, and protect your heart, liver, and kidneys.  Most people on this plan should limit their sodium intake to 1,500-2,000 mg (milligrams) of sodium each day.  Canned, boxed, and frozen foods are high in sodium. Restaurant foods, fast foods, and pizza are also very high in sodium. You also get sodium by adding salt to food.  Try to cook at home, eat more fresh fruits and  vegetables, and eat less fast food, canned, processed, or prepared foods. This information is not intended to replace advice given to you by your health care provider. Make sure you discuss any questions you have with your health care  provider. Document Revised: 01/26/2017 Document Reviewed: 02/07/2016 Elsevier Patient Education  2020 [employer].

## 2019-05-19 NOTE — Telephone Encounter (Signed)
Patient contacted the office asking for a refill on Oxycodone HCL 10mg  tablets. This was last refilled on 04/22/19 for #120 with 0 refills. Patient was last seen on 05/14/19 and has a CPE scheduled on 08/27/19. She would like this sent in to the [facility] on [facility].  Dr. Silvio Pate, is this ok to refill?

## 2019-05-22 DIAGNOSIS — J449 Chronic obstructive pulmonary disease, unspecified: Secondary | ICD-10-CM | POA: Diagnosis not present

## 2019-05-22 DIAGNOSIS — M069 Rheumatoid arthritis, unspecified: Secondary | ICD-10-CM | POA: Diagnosis not present

## 2019-05-22 DIAGNOSIS — M109 Gout, unspecified: Secondary | ICD-10-CM | POA: Diagnosis not present

## 2019-05-22 DIAGNOSIS — I110 Hypertensive heart disease with heart failure: Secondary | ICD-10-CM | POA: Diagnosis not present

## 2019-05-22 DIAGNOSIS — I2510 Atherosclerotic heart disease of native coronary artery without angina pectoris: Secondary | ICD-10-CM | POA: Diagnosis not present

## 2019-05-22 DIAGNOSIS — I5033 Acute on chronic diastolic (congestive) heart failure: Secondary | ICD-10-CM | POA: Diagnosis not present

## 2019-05-24 ENCOUNTER — Other Ambulatory Visit: Payer: Self-pay | Admitting: Internal Medicine

## 2019-05-25 DIAGNOSIS — J449 Chronic obstructive pulmonary disease, unspecified: Secondary | ICD-10-CM | POA: Diagnosis not present

## 2019-05-25 DIAGNOSIS — I5033 Acute on chronic diastolic (congestive) heart failure: Secondary | ICD-10-CM | POA: Diagnosis not present

## 2019-05-25 DIAGNOSIS — I2510 Atherosclerotic heart disease of native coronary artery without angina pectoris: Secondary | ICD-10-CM | POA: Diagnosis not present

## 2019-05-25 DIAGNOSIS — M109 Gout, unspecified: Secondary | ICD-10-CM | POA: Diagnosis not present

## 2019-05-25 DIAGNOSIS — M069 Rheumatoid arthritis, unspecified: Secondary | ICD-10-CM | POA: Diagnosis not present

## 2019-05-25 DIAGNOSIS — I110 Hypertensive heart disease with heart failure: Secondary | ICD-10-CM | POA: Diagnosis not present

## 2019-05-26 ENCOUNTER — Telehealth: Payer: Self-pay | Admitting: Internal Medicine

## 2019-05-26 ENCOUNTER — Ambulatory Visit: Payer: Federal, State, Local not specified - PPO | Admitting: Cardiovascular Disease

## 2019-05-26 NOTE — Telephone Encounter (Signed)
I thought that might be the case She will have her usual follow up and we can review then

## 2019-05-26 NOTE — Telephone Encounter (Signed)
Davene with [facility] called. She stated they have added on Nursing for the patient. She also stated that the patient may be going through depression. She was able speak with the patient about depression. The patient was hesitant at first but after speaking with her she admitting to possible being depressed. Davene advised the patient to schedule an appointment with her provider as soon as possible. Davene stated the patient is stable and not in any danger. Davene can be reached at [phone].

## 2019-05-26 NOTE — Telephone Encounter (Signed)
She doesn't have an appt till the end of June. If she really feels depressed daily--and wants to consider Rx--please set her up for an appt soon

## 2019-05-26 NOTE — Telephone Encounter (Signed)
Spoke to pt. She is down about her current situation. Not happy about it, but not depressed. Has not been outside since dog passed. She does not feel she needs medication but will think about it. May do a virtual visit soon.

## 2019-05-27 DIAGNOSIS — M069 Rheumatoid arthritis, unspecified: Secondary | ICD-10-CM | POA: Diagnosis not present

## 2019-05-27 DIAGNOSIS — I2510 Atherosclerotic heart disease of native coronary artery without angina pectoris: Secondary | ICD-10-CM | POA: Diagnosis not present

## 2019-05-27 DIAGNOSIS — I5033 Acute on chronic diastolic (congestive) heart failure: Secondary | ICD-10-CM | POA: Diagnosis not present

## 2019-05-27 DIAGNOSIS — M109 Gout, unspecified: Secondary | ICD-10-CM | POA: Diagnosis not present

## 2019-05-27 DIAGNOSIS — I110 Hypertensive heart disease with heart failure: Secondary | ICD-10-CM | POA: Diagnosis not present

## 2019-05-27 DIAGNOSIS — J449 Chronic obstructive pulmonary disease, unspecified: Secondary | ICD-10-CM | POA: Diagnosis not present

## 2019-05-29 ENCOUNTER — Other Ambulatory Visit: Payer: Self-pay | Admitting: Internal Medicine

## 2019-05-29 ENCOUNTER — Telehealth: Payer: Self-pay

## 2019-05-29 DIAGNOSIS — I2510 Atherosclerotic heart disease of native coronary artery without angina pectoris: Secondary | ICD-10-CM | POA: Diagnosis not present

## 2019-05-29 DIAGNOSIS — I5033 Acute on chronic diastolic (congestive) heart failure: Secondary | ICD-10-CM | POA: Diagnosis not present

## 2019-05-29 DIAGNOSIS — J449 Chronic obstructive pulmonary disease, unspecified: Secondary | ICD-10-CM | POA: Diagnosis not present

## 2019-05-29 DIAGNOSIS — M069 Rheumatoid arthritis, unspecified: Secondary | ICD-10-CM | POA: Diagnosis not present

## 2019-05-29 DIAGNOSIS — M109 Gout, unspecified: Secondary | ICD-10-CM | POA: Diagnosis not present

## 2019-05-29 DIAGNOSIS — I110 Hypertensive heart disease with heart failure: Secondary | ICD-10-CM | POA: Diagnosis not present

## 2019-05-29 NOTE — Telephone Encounter (Signed)
noted 

## 2019-05-29 NOTE — Telephone Encounter (Signed)
Donalynn Furlong PT with [facility] said that pt is being discharged from [facility] PT due to pt reaching her maximum potential. FYI to Dr Silvio Pate.

## 2019-06-05 ENCOUNTER — Encounter: Payer: Self-pay | Admitting: *Deleted

## 2019-06-05 ENCOUNTER — Other Ambulatory Visit: Payer: Self-pay | Admitting: *Deleted

## 2019-06-05 NOTE — Patient Outreach (Signed)
[place] [facility]) Care Management [facility] CM Telephone Outreach Post- SNF discharge day # 12 without unplanned hospital re-admission  06/05/2019  Tina Pacheco 08-17-1950 [id number]  Successful telephone outreach to Tina Pacheco, 69 y/o female referred initially to [facility] RN CM for transition of care after recent hospitalization January 31- April 04, 2019 for weakness, multiple falls at home, shortness of breath/ acute on chronic CHF. Patient was discharged to SNF for rehabilitation and was subsequently discharged from SNF to home on April 10, 2019 with home health services.   Patient has history including, but not limited to, morbid obesity; HTN/ HLD; chronic pain related to osteoarthritis/ rheumatoid arthritis; ongoing use of narcotics; p- Atrial fibrillation; CHF; and COPD.  HIPAA/ identity verified. Patient reports "doingpretty good,"and reports ongoing chronic shoulder pain; declines quantifying pain today and confirms that she has scheduled an orthopedic provider follow up appointment to evaluate her chronic pain-- scheduled for next week, 06/11/19; verbalizes plans to attend; states husband will provide transportation.  Patient denies clinical concerns otherwise and sounds to be in no distress throughout phone call today.  Patient further reports:  -- has and is taking all medications as prescribed; denies new/ recent changes to medications and confirms that she has increased her weekly dosing of torsemide as recommended during recent cardiology provider appointment; continues to self- manage medications.  Reports taking torsemide "about 3 times per week;" states took dose most recently "yesterday."  Confirms that she completed prednisone on Tuesday 06/03/19  -- home health services have signed off, stated "they said I was doing good and had met my goals;" verbalizes agreement with home health discharge  -- has attended all recent scheduled provider office  visits; reviewed with patient post- cardiology office visit instructions; patient verbalizes good understanding of same.  Verbalizes plans to attend orthopedic provider office visit next week as scheduled; 4/03/03/19  -- continues monitoring/ recording daily weights at home: previous reported weight ranges at home initially between 316-320 lbs; gradually increased to "321- 323 lbs;" with ongoing gain-- reported weight this morning of "337 lbs;" confirms that PCP and cardiologist both are aware and have addressed; reports she was told by cardiology provider that some of her other medications may be contributing to weight gain; she denies other signs/ symptoms yellow CHF zone and is able to independently verbalize signs/ symptoms of same along with corresponding action plan ---- discussed with patient recent recommendation made by cardiology provider to follow more strict low sodium/ salt diet; discussed patient's struggle in following this diet as she "loves" salty foods and continues to snack on potato chips/ etc; provided education around strategies to incorporate small dietary changes into her ongoing routine, and she reports she has reviewed the printed educational material provided to her by cardiology provider around low-salt diet; patient declines need for ongoing education, stating, "I know what to do, I just can't make myself do it yet."  Encouraged patient to begin making small changes rather than overwhelming herself trying to change entire diet overnight; discussed portion control and provided education around rationale for low salt diet in setting of CHF; patient declines my offer to provide additional printed educational material  ---- reviewed with patient signs/ symptoms CVA/ MI along with corresponding action plan and provided education around reasons to contact care providers vs. seeking urgent/ emergent care, and she confirms that she understands same  Patient denies further issues, concerns,  or problems today. Iconfirmed that patient hasmy direct phone number, the main [facility] CM office  phone number, and the [facility] CM 24-hour nurse advice phone number should issues arise prior to next scheduled [facility] CM outreachnext monht, or sooner if indicated.Encouraged patient to contact me directly if needs, questions, issues, or concerns arise prior to next scheduled outreach; patient agreed to do so.  Plan:  Patient will take medications as prescribed and will attend all scheduled provider appointments  Patient will promptly notify care providers for any new concerns/ issues/ problems that arise  Patient willcontinuemonitoring/ recording daily weightsand will follow established action plan for weight gain  Patient will review signs/ symptoms CHF zones daily  Patient will begin taking steps to incorporate low sodium diet into her ongoing routine  [facility] CM outreachto continue with scheduled phone call next month, unless indicated sooner  [facility] CM Care Plan Problem Two     Most Recent Value  Care Plan Problem Two  Need for ongoing reinforcement of self-health management strategies of chronic disease state of CHF, as evidenced by patient reporting  Role Documenting the Problem Two  Care Management Coordinator  Care Plan for Problem Two  Active  Interventions for Problem Two Long Term Goal   Confirmed that patient continues monitoring and recording daily weights at home,  reviewed recent weights at home and cardiology provider office visit with patient,  reviewed use of prn diuretic with patient,  reviewed recent cardiology provider appointment instructions with patient  [facility] Long Term Goal  Over the next 60 days, patient will monitor and record daily weights at home as evidenced by review of same with patient during [facility] RN CM outreach  [facility] Long Term Goal Start Date  05/16/19  [facility] CM Short Term Goal #1   Over the next 30 days, patient will independently verbalize signs/ symptoms CHF  yellow zone along with corresponding action plan, as evidenced by patient reporting during [facility] RN CM outreach  [facility] CM Short Term Goal #1 Start Date  05/16/19  [facility] CM Short Term Goal #1 Met Date   06/05/19 [Goal Met]  Interventions for Short Term Goal #2   Confirmed that patient is able to independently verbalize accurate signs/ symptoms yellow CHF zone, along with corresponding action plan for same,  positive reinforcement provided.  Reviewed with patient current clinical condition and confirmed that she has no current signs/ symptoms yellow CHF zone,  reviewed with patient signs/ symptoms CVA/ MI, along with corresponding action plan,  discussed with pateint reasons to contact care provider teams vs. seek urgent/ emergent care  [facility] CM Short Term Goal #3   Over the next 30 days, patient will verbalize strategies she has taken to incorporate low salt diet into her routine, as evidenced by patient reporting during [facility] RN CM outreach  [facility] CM Short Term Goal #3 Start Date  06/05/19  Interventions for Short Term Goal #3  Reviewed with patient cardiology provider recommendations to follow low salt diet,  confirmed that patient reviewed post-office visit educational material around low salt diet,  discussed with patient her challenges around following low salt diet,  encouraged patient to begin making small changes to diet to facilitate permanent and lasting changes over time,  discussed with patient rationale for low salt diet in setting of CHF     Oneta Rack, RN, BSN, [employer] Coordinator [facility] Care Management  [phone]

## 2019-06-07 ENCOUNTER — Other Ambulatory Visit: Payer: Self-pay | Admitting: Cardiovascular Disease

## 2019-06-08 DIAGNOSIS — M109 Gout, unspecified: Secondary | ICD-10-CM | POA: Diagnosis not present

## 2019-06-08 DIAGNOSIS — J449 Chronic obstructive pulmonary disease, unspecified: Secondary | ICD-10-CM | POA: Diagnosis not present

## 2019-06-08 DIAGNOSIS — I5033 Acute on chronic diastolic (congestive) heart failure: Secondary | ICD-10-CM | POA: Diagnosis not present

## 2019-06-08 DIAGNOSIS — M069 Rheumatoid arthritis, unspecified: Secondary | ICD-10-CM | POA: Diagnosis not present

## 2019-06-08 DIAGNOSIS — I2510 Atherosclerotic heart disease of native coronary artery without angina pectoris: Secondary | ICD-10-CM | POA: Diagnosis not present

## 2019-06-08 DIAGNOSIS — I110 Hypertensive heart disease with heart failure: Secondary | ICD-10-CM | POA: Diagnosis not present

## 2019-06-11 DIAGNOSIS — M25511 Pain in right shoulder: Secondary | ICD-10-CM | POA: Diagnosis not present

## 2019-06-11 DIAGNOSIS — R2231 Localized swelling, mass and lump, right upper limb: Secondary | ICD-10-CM | POA: Diagnosis not present

## 2019-06-17 ENCOUNTER — Other Ambulatory Visit: Payer: Self-pay | Admitting: Internal Medicine

## 2019-06-17 DIAGNOSIS — G894 Chronic pain syndrome: Secondary | ICD-10-CM

## 2019-06-17 DIAGNOSIS — M1990 Unspecified osteoarthritis, unspecified site: Secondary | ICD-10-CM

## 2019-06-17 MED ORDER — OXYCODONE HCL 10 MG PO TABS: 20.0000 mg | ORAL_TABLET | Freq: Two times a day (BID) | ORAL | 0 refills | Status: DC | PRN

## 2019-06-17 NOTE — Telephone Encounter (Signed)
Name of Medication: oxycodone 10 mg Name of Pharmacy: [facility] [address] Last Fill or Written Date and Quantity: # 120 on 05/19/19 Last Office Visit and Type:05/14/19 HFU& pain mgt  Next Office Visit and Type:08/27/19 Med AWV  Last Controlled Substance Agreement Date:05/23/19  Last UDS:05/14/19

## 2019-06-17 NOTE — Telephone Encounter (Signed)
Patient called requesting a refill  Oxycodone HCl 10 MG TABS  She stated she has 1 tablet left for tomorrow then she will be out of medication     [id number] - [place], [place]

## 2019-06-27 DIAGNOSIS — R2231 Localized swelling, mass and lump, right upper limb: Secondary | ICD-10-CM | POA: Diagnosis not present

## 2019-07-07 ENCOUNTER — Other Ambulatory Visit: Payer: Self-pay | Admitting: *Deleted

## 2019-07-07 ENCOUNTER — Encounter: Payer: Self-pay | Admitting: *Deleted

## 2019-07-07 NOTE — Patient Outreach (Signed)
[facility]) Care Management [facility] CM Telephone Outreach Post- SNF discharge day # 88 without unplanned hospital re-admission  07/07/2019  Tina Pacheco 1950-07-27 [id number]  Successful telephone outreach to Tina Pacheco, 69 y/o female referred initially to [facility] RN CM for transition of care after recent hospitalization January 31- April 04, 2019 for weakness, multiple falls at home, shortness of breath/ acute on chronic CHF. Patient was discharged to SNF for rehabilitation and was subsequently discharged from SNF to home on April 10, 2019 with home health services.   Patient has history including, but not limited to, morbid obesity; HTN/ HLD; chronic pain related to osteoarthritis/ rheumatoid arthritis; ongoing use of narcotics; p- Atrial fibrillation; CHF; and COPD.  HIPAA/ identity verified. Patient reports "doingokay overall,"and states that she has a "bad headache today;" declines quantifying pain level; states, "this is my usual headache; I am taking my medicine and it just comes and goes over last few days."  Admits to ongoing shoulder pain, again declines to quantifying shoulder pain.  She denies new/ recent falls, stating "no falls" post- SNF discharge in February 2021.  She sounds to be in no distress throughout phone call today.  Patient further reports:  -- has and is taking all medicationsas prescribed; reports she is no longer taking prednisone, states completed dose.  States orthopedic provider added new prescription for meloxicam- medication list in EHR updated accordingly.  Reports continues taking diuretic as prescribed, stating "been taking about every day."  Declines offer to complete medication review today, stating that she doesn't feel up to it because of her headache; she is agreeable to completing at time of next outreach  -- has attended all recent scheduled provider office visits; reviewed with patient upcoming scheduled provider  office visits- she has accurate understanding of all along with plans to attend all; family will continue to provide transportation ---- Thursday May 13: MRI/ shoulder ---- May 26: orthopedic provider ---- June 28: cardiology provider ---- June 30: PCP provider  -- continues monitoring/ recording daily weights at home:baseline weight ranges previously reported between316-320 lbs; with last outreach reported at 337 lbs ---- Again reports slow gradual increasing weight levels "as high as 350 lbs last week" ---- She declines telling me her specific weight ranges today, stating that she "is just too embarrassed;" had a long conversation with patient around role of [facility] CM team and need for her to feel comfortable to talk to her care providers for maximum value/ benefit to assist her in meeting her previously established goals; explained there is no judgement; patient reports, "I am judging myself because I love to eat and I just can't seem to stop eating; I just have no motivation to make any real changes; I do pretty good one day, but the next day I am right back to eating all the wrong stuff and too much of it."  Emotional support provided and depression screening completed. ---- eventually she tells me that her weight today was "347 lbs" ---- continues able to verbalize weight gain guidelines in setting of CHF along with corresponding action plan; she tells me she "knows weight gain is not fluid," again admitting that she "knows she is eating too much and all the wrong stuff." ---- denies signs/ symptoms CHF yellow zone; able to accurately verbalize same ---- again discussed value of starting out with small changes and incorporating into routine; patient states she "does good for a few days" but is unable to "stick with it for any  amount of time;" encouraged her to discuss with her care providers, which she reports "they already know" again admitting that 'the problem is with me." ---- patient again  declines my offer to provide additional printed educational material stating that she has all of the information, she just "can't make myself do it." ---- tells me she "slurged" and had a "safe-step" walk-in tub installed in her home, which she reports "helps me get a bath without risking any falls;" positive reinforcement provided  Patient denies further issues, concerns, or problems today. Iconfirmed that patient hasmy direct phone number, the main [facility] CM office phone number, and the [facility] CM 24-hour nurse advice phone number should issues arise prior to next scheduled [facility] CM outreach post- upcoming provider office visits, per patient request; will outreach sooner if indicated.Discussed with patient possibility of transfer to [place] at time of next outreach, patient is agreeable.  Encouraged patient to contact me directly if needs, questions, issues, or concerns arise prior to next scheduled outreach; patient agreed to do so.  Plan:  Patient will take medications as prescribed and will attend all scheduled provider appointments  Patient will promptly notify care providers for any new concerns/ issues/ problems that arise  Patient willcontinuemonitoring/ recording daily weightsand will follow established action plan for weight gain  Patient will continue reviewing signs/ symptoms CHF zones daily  Patient will continue taking steps to incorporate low sodium diet into her ongoing routine  I will share today's [facility] CM notes/ care plan with patient's PCP as [facility] CM quarterly update  [facility] CM outreachto continue with scheduled phone callpost-upcoming provider office visits, unless indicated sooner  [facility] CM Care Plan Problem One     Most Recent Value  Care Plan Problem One  Lack of patient motivation to take steps meet stated goals around self-health management of chronic disease state of CHF in setting of morbid obesity, as evidenced by patient reporting  Role Documenting  the Problem One  Care Management Coordinator  Care Plan for Problem One  Active  [facility] Long Term Goal   Over the next 60 days, patient will attend all provider appointments as scheduled, as evidenced by patient reporting and collaboration with care providers as indicated during [facility] RN CM   [facility] Long Term Goal Start Date  07/07/19  Interventions for Problem One Long Term Goal  Confirmed that patient has accurate understanding of upcoming provider appointments, along with plans to attend all,  confirmed that she continues to have reliable transportation to all scheduled provider appointments through family members,  confirmed that patient denies signs/ symptoms depression and feels comfortable talking with her care providers,  encouraged her to discuss her lack of motivation with her care providers at scheduled upcoming provider office visits,  discussed value of starting with small changes that can be more easily incorporated into her daily routine,  discussed possibility of transfer to [place] at time of next scheduled outreach     [facility] CM Care Plan Problem Two     Most Recent Value  Care Plan Problem Two  Need for ongoing reinforcement of self-health management strategies of chronic disease state of CHF, as evidenced by patient reporting  Role Documenting the Problem Two  Care Management Coordinator  Care Plan for Problem Two  Not Active  Interventions for Problem Two Long Term Goal   Confirmed that patient has continued to monitor and record daily weights at home,  discussed with patient value of sharing specific  weight ranges with [facility] RN CM,  explained that there is no judgement incurred in her reported weight gain,  re-explained role of [facility] CM team in helping patient meet her goals,  attempted to review and update medications with patient, which she declined,  shared with patient's PCP today's notes and care plan as [facility] CM quarterly update   [facility] Long Term Goal  Over the next 60 days, patient  will monitor and record daily weights at home as evidenced by review of same with patient during [facility] RN CM outreach  [facility] Long Term Goal Start Date  05/16/19  [facility] Long Term Goal Met Date  07/07/19 [Goal met]  [facility] CM Short Term Goal #3   Over the next 30 days, patient will verbalize strategies she has taken to incorporate low salt diet into her routine, as evidenced by patient reporting during [facility] RN CM outreach  [facility] CM Short Term Goal #3 Start Date  06/05/19  [facility] CM Short Term Goal #3 Met Date  07/07/19 [Goal met]  Interventions for Short Term Goal #3  Discussed with patient previously provided education around low salt diet,  discussed patient's lack of motivation to incorporate previously discussed strategies into her regular routine,  completed depression screening and encouraged patient to continue trying to make small changes that she can stick with for her ongoing daily dietary routine,  again offered to provide patient with printed educational material to assist her in continuing to meet her goal, which she again declines today     Oneta Rack, RN, BSN, [employer] Coordinator [facility] Care Management  [phone]

## 2019-07-16 ENCOUNTER — Other Ambulatory Visit: Payer: Self-pay

## 2019-07-16 ENCOUNTER — Other Ambulatory Visit: Payer: Self-pay | Admitting: Physician Assistant

## 2019-07-16 DIAGNOSIS — M1990 Unspecified osteoarthritis, unspecified site: Secondary | ICD-10-CM

## 2019-07-16 DIAGNOSIS — G894 Chronic pain syndrome: Secondary | ICD-10-CM

## 2019-07-16 MED ORDER — OXYCODONE HCL 10 MG PO TABS: 20.0000 mg | ORAL_TABLET | Freq: Two times a day (BID) | ORAL | 0 refills | Status: DC | PRN

## 2019-07-16 NOTE — Telephone Encounter (Signed)
Patient contacted the office asking for a refill on Oxycodone 10mg . Last refilled 06/17/19 for #120 with 0 refills. Patient was last seen 05/14/19 and has an upcoming appt on 08/27/19. Ok to refill?

## 2019-08-11 ENCOUNTER — Other Ambulatory Visit: Payer: Self-pay

## 2019-08-11 NOTE — Telephone Encounter (Signed)
Error

## 2019-08-14 ENCOUNTER — Other Ambulatory Visit: Payer: Self-pay | Admitting: Internal Medicine

## 2019-08-14 DIAGNOSIS — G894 Chronic pain syndrome: Secondary | ICD-10-CM

## 2019-08-14 DIAGNOSIS — M1990 Unspecified osteoarthritis, unspecified site: Secondary | ICD-10-CM

## 2019-08-14 MED ORDER — OXYCODONE HCL 10 MG PO TABS: 20.0000 mg | ORAL_TABLET | Freq: Two times a day (BID) | ORAL | 0 refills | Status: DC | PRN

## 2019-08-14 NOTE — Telephone Encounter (Signed)
Patient called to get a refill on Oxycodone. Patient has enough pills for today.  Patient uses [facility] [employer].

## 2019-08-14 NOTE — Telephone Encounter (Signed)
Name of Medication: oxycodone 10 mg Name of Pharmacy:[facility] [address] Last Fill or Written Date and Quantity:# 120 on 07/16/19 Last Office Visit and Type: 05/14/19 HFU & med refill Next Office Visit and Type: 08/27/19 AWV Last Controlled Substance Agreement Date:05/23/19  Last UDS:05/14/19

## 2019-08-23 NOTE — Progress Notes (Addendum)
Date:  08/25/2019   ID:  Tina Pacheco, [name] 12/24/1950, MRN [id number]  Patient Location:  [place] Tina Pacheco [id number]   Provider location:   [facility], [place] office  PCP:  Venia Carbon, MD  Cardiologist:  Ida Rogue, MD   Chief Complaint  Patient presents with  . Other    3 month follow up. Patient c/o weight gain and SOB. Meds reviewed verbally with patient.      History of Present Illness:    Tina Pacheco is a 69 y.o. female  past medical history of: morbid obesity , fibromyalgia  March 26,2012paroxysmal atrial fibrillation,  echocardiogram showing normal LV function with diastolic dysfunction,  hyperlipidemia, hypertension,  cardiac catheterization May 24 2010 mild nonobstructive coronary artery disease,  asthma/COPD,  suspected sleep apnea,  atrial fibrillation March and July 2012, October 2013.(no symptoms) started on diltiazem infusion and converted to normal sinus rhythm  rheumatoid arthritis  who presents for routine followup of her atrial fibrillation, chronic diastolic CHF.  On her last clinic visit with me,  weight was down 60 pounds she was following weight watchers and was using torsemide periodically for leg swelling Was exercising with her recumbent bike  Since then she has been seen in clinic by one of our providers May 19, 2019 for leg edema weight gain She takes torsemide as needed, was recommended she take more torsemide for weight gain  In follow-up today, reports feeling depressed Lost dog, battling arthritis  Weight up 20 pounds, 350 pounds Feels prednisone caused the weight Lots of snacking, ginger ale  Difficulty taking torsemide Difficulty making it to the bathroom, "Diaper rash" can't get to the  Bathroom fast enough  Lives with husband, Daughter comes in to help, with groceries Has a pool, can't get in  Drinks a lot of fluids, ginger ale (not diet)  Denies any tachycardia  concerning for atrial fibrillation  Having headaches Sees Dr. Silvio Pate this week Overall she is very discouraged by the details above and her inability to do anything  EKG personally reviewed by myself on todays visit NSR rate 61 bpm no ST or T wave changes  Previous Echo in 2016 with EF 25%, diastolic dysfunction 10/5636: EF 55%  prior hx of vertigo, better on meclizine   Prior CV studies:   The following studies were reviewed today:  Echo 2016 Left ventricle: The cavity size was normal. There was moderate   concentric hypertrophy. Systolic function was normal. The   estimated ejection fraction was in the range of 55% to 60%. Wall   motion was normal; there were no regional wall motion   abnormalities. Features are consistent with a pseudonormal left   ventricular filling pattern, with concomitant abnormal relaxation   and increased filling pressure (grade 2 diastolic dysfunction). - Mitral valve: Calcified annulus. There was mild regurgitation. - Left atrium: The atrium was moderately dilated. - Pulmonary arteries: Systolic pressure was mildly increased. PA   peak pressure: 38 mm Hg (S).  Past Medical History:  Diagnosis Date  . Acute CHF (congestive heart failure) ([place]) 03/30/2019  . Arthritis    RA  . Asthma   . Basal cell carcinoma   . Cataract 2018   bilateral eyes; corrected with surgery  . Claustrophobia   . Collagen vascular disease (HCC)    RA  . COPD (chronic obstructive pulmonary disease) ([place])   . Diastolic dysfunction    a. echo 07/2014: EF 55-60%, no RWMA,  GR2DD, mild MR, LA moderately dilated, PASP 38 mm Hg  . Dyslipidemia   . Dysrhythmia   . Headache    migraines  . Hemihypertrophy   . History of cardiac cath    a. 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 History of gout   . Hyperplastic colonic polyp 2003  . Hypertension   . Hypokalemia   . Morbid obesity ([place])   . PAF (paroxysmal atrial fibrillation) (HCC)    a. on Pradaxa; b. CHADSVASc  at least 2 (HTN & female)  . Rheumatoid arthritis(714.0)   . Sleep apnea    a. not compliant with CPAP   Past Surgical History:  Procedure Laterality Date  . ABDOMINAL HYSTERECTOMY    . APPLICATION OF WOUND VAC Right 08/09/2017   Procedure: APPLICATION OF WOUND VAC;  Surgeon: Albertine Patricia, DPM;  Location: [facility] ORS;  Service: Podiatry;  Laterality: Right;  . CARDIAC CATHETERIZATION  05/24/2010   nonobstructive CAD  . CATARACT EXTRACTION W/ INTRAOCULAR LENS IMPLANT Right 06/12/2016   Dr. Darleen Crocker  . CATARACT EXTRACTION W/ INTRAOCULAR LENS IMPLANT Left 06/26/2016   Dr. Darleen Crocker  . CESAREAN SECTION    . CHOLECYSTECTOMY    . COLONOSCOPY  06/12/2011   Procedure: COLONOSCOPY;  Surgeon: Juanita Craver, MD;  Location: [facility];  Service: Endoscopy;  Laterality: N/A;  . COLONOSCOPY N/A 03/17/2013   Procedure: COLONOSCOPY;  Surgeon: Juanita Craver, MD;  Location: [facility] END[facility]COPY;  Service: Endoscopy;  Laterality: N/A;  . EYE SURGERY    . FOOT ARTHRODESIS Right 07/13/2017   Procedure: ARTHRODESIS FOOT-MULTI.FUSIONS (6 JOINTS);  Surgeon: Albertine Patricia, DPM;  Location: [facility] ORS;  Service: Podiatry;  Laterality: Right;  . IRRIGATION AND DEBRIDEMENT FOOT Right 08/09/2017   Procedure: IRRIGATION AND DEBRIDEMENT FOOT;  Surgeon: Albertine Patricia, DPM;  Location: [facility] ORS;  Service: Podiatry;  Laterality: Right;  . KNEE ARTHROSCOPY     bilateral  . SINUSOTOMY    . TOOTH EXTRACTION  12/2016  . VAGINAL HYSTERECTOMY       Current Outpatient Medications on File Prior to Visit  Medication Sig Dispense Refill  . amLODipine (NORVASC) 5 MG tablet TAKE 1 TABLET BY MOUTH EVERY DAY IN THE EVENING 90 tablet 3  . atorvastatin (LIPITOR) 20 MG tablet TAKE 1 TABLET DAILY AT 6PM 90 tablet 3  . diphenhydrAMINE (BENADRYL) 25 MG tablet Take 25 mg by mouth daily as needed for allergies.    . folic acid (FOLVITE) 1 MG tablet Take 1 mg by mouth daily.    Marland Kitchen gabapentin (NEURONTIN) 300 MG capsule Take 1 capsule 3  times a day (Take 1 capsule 2-3 hours before bedtime) 90 capsule 11  . hydroxychloroquine (PLAQUENIL) 200 MG tablet Take 200 mg by mouth 2 (two) times daily.     Marland Kitchen leflunomide (ARAVA) 20 MG tablet Take 20 mg by mouth every morning.     . meclizine (ANTIVERT) 25 MG tablet Take 1 tablet (25 mg total) by mouth 3 (three) times daily as needed for dizziness. 90 tablet 5  . meloxicam (MOBIC) 7.5 MG tablet Take 7.5 mg by mouth 2 (two) times daily.    . metoprolol succinate (TOPROL-XL) 50 MG 24 hr tablet TAKE 1 TABLET BY MOUTH EVERY DAY WITH OR IMMEDIATELY FOLLOWING A MEAL (Patient taking differently: Take 50 mg by mouth at bedtime. ) 90 tablet 3  . montelukast (SINGULAIR) 10 MG tablet TAKE 1 TABLET BY MOUTH AT BEDTIME 90 tablet 3  . Multiple Vitamin (MULTIVITAMIN) tablet Take 1 tablet  by mouth daily.     . Oxycodone HCl 10 MG TABS Take 2 tablets (20 mg total) by mouth 2 (two) times daily as needed (moderate pain). Patient is taking 2 tablets in the morning and 2 tablets at bedtime 120 tablet 0  . polyethylene glycol (MIRALAX / GLYCOLAX) packet Take 17 g by mouth daily as needed for mild constipation. 14 each 0  . potassium chloride SA (KLOR-CON) 20 MEQ tablet TAKE 1/2 TABLET BY MOUTH TWICE A DAY(MAY TAKE AN EXTRA TABLET WHEN TAKING TORSEMIDE) 180 tablet 3  . PRADAXA 150 MG CAPS capsule TAKE 1 CAPSULE TWICE DAILY 180 capsule 0  . SF 5000 PLUS 1.1 % CREA dental cream Place 1 application onto teeth 2 (two) times daily.  1  . sodium chloride (OCEAN) 0.65 % SOLN nasal spray Place 1 spray into both nostrils as needed for congestion.    Marland Kitchen terazosin (HYTRIN) 10 MG capsule Take 1 capsule (10 mg total) by mouth at bedtime. 90 capsule 3  . tolterodine (DETROL LA) 2 MG 24 hr capsule TAKE 1 CAPSULE(2 MG) BY MOUTH DAILY AS NEEDED FOR BLADDER SPASMS 90 capsule 3  . torsemide (DEMADEX) 20 MG tablet Take 20 mg by mouth twice daily as needed for swelling (Patient taking differently: Take 20 mg by mouth daily as needed. Take  20 mg by mouth twice daily as needed for swelling) 180 tablet 3   No current facility-administered medications on file prior to visit.    Allergies:   Codeine   Social History   Tobacco Use  . Smoking status: Never Smoker  . Smokeless tobacco: Never Used  Vaping Use  . Vaping Use: Never used  Substance Use Topics  . Alcohol use: No  . Drug use: No     Family Hx: The patient's family history includes Breast cancer in her maternal aunt and sister; Diabetes type II in her sister; Emphysema in her father; Parkinsonism in her mother; Tuberculosis in her mother and sister.  ROS:   Please see the history of present illness.    Review of Systems  Constitutional: Negative.        Weak  Respiratory: Negative.   Cardiovascular: Negative.   Gastrointestinal: Negative.   Musculoskeletal: Positive for joint pain.  Neurological: Negative.   Psychiatric/Behavioral: Negative.   All other systems reviewed and are negative.     Labs/Other Tests and Data Reviewed:    Recent Labs: 03/30/2019: ALT 21; B Natriuretic Peptide 285.0 04/04/2019: BUN 23; Creatinine, Ser 0.71; Hemoglobin 12.5; Platelets 275; Potassium 4.0; Sodium 139   Recent Lipid Panel Lab Results  Component Value Date/Time   CHOL 152 12/14/2015 10:09 AM   TRIG 98.0 12/14/2015 10:09 AM   HDL 37.60 (L) 12/14/2015 10:09 AM   CHOLHDL 4 12/14/2015 10:09 AM   LDLCALC 95 12/14/2015 10:09 AM    Wt Readings from Last 3 Encounters:  08/25/19 (!) 349 lb (158.3 kg)  05/19/19 (!) 329 lb 2 oz (149.3 kg)  05/14/19 (!) 341 lb (154.7 kg)     Exam:    Vital Signs:  BP 130/66 (BP Location: Right Arm, Patient Position: Sitting, Cuff Size: Large)   Pulse 61   Ht 5\' 3"  (1.6 m)   Wt (!) 349 lb (158.3 kg)   SpO2 98%   BMI 61.82 kg/m   Constitutional:  oriented to person, place, and time. No distress.  HENT:  Head: Grossly normal Eyes:  no discharge. No scleral icterus.  Neck: No JVD, no carotid bruits  Cardiovascular: Regular  rate and rhythm, no murmurs appreciated Pulmonary/Chest: Clear to auscultation bilaterally, no wheezes or rails Abdominal: Soft.  no distension.  no tenderness.  Musculoskeletal: Normal range of motion Neurological:  normal muscle tone. Coordination normal. No atrophy Skin: Skin warm and dry Psychiatric: normal affect, pleasant   ASSESSMENT & PLAN:     1. PAF (paroxysmal atrial fibrillation) (HCC) No sx, doing well, no med changes Does not appear to be having breakthrough arrhythmia  2. Essential hypertension Blood pressure is well controlled on today's visit. No changes made to the medications.  3. Mixed hyperlipidemia Cholesterol is at goal on the current lipid regimen. No changes to the medications were made.  4. Rheumatoid arthritis, involving unspecified site, unspecified rheumatoid factor presence ([place]) Limiting her ability to exercise  5.  Morbid obesity With hypertension, paroxysmal atrial fibrillation, rheumatoid arthritis, chronic diastolic CHF, depression Really debilitating, unable to even get into a swimming pool to exercise Discussed various strategies, recommend she try to get into her pool for some water aerobics daily Depression likely getting in the way  6.  Depression Discussed with her on today's visit, she feels that it is playing a role in her inability to get going.  Suggested she discuss this with Dr. Silvio Pate, she may do well with a trial of SSRIs.  Will defer to his office  Long discussion concerning depression, Morbid obesity, strategies for weight loss, Family dynamics  Total encounter time more than 45 minutes  Greater than 50% was spent in counseling and coordination of care with the patient   Disposition: Follow-up in 6 months   Signed, Ida Rogue, MD  08/25/2019 1:36 PM    [place] [facility] [address] [place] [address], [place], [place] [address]

## 2019-08-25 ENCOUNTER — Other Ambulatory Visit: Payer: Self-pay

## 2019-08-25 ENCOUNTER — Ambulatory Visit (INDEPENDENT_AMBULATORY_CARE_PROVIDER_SITE_OTHER): Payer: Medicare Other | Admitting: Cardiovascular Disease

## 2019-08-25 ENCOUNTER — Encounter: Payer: Self-pay | Admitting: Cardiovascular Disease

## 2019-08-25 VITALS — BP 130/66 | HR 61 | Ht 63.0 in | Wt 349.0 lb

## 2019-08-25 DIAGNOSIS — J449 Chronic obstructive pulmonary disease, unspecified: Secondary | ICD-10-CM

## 2019-08-25 DIAGNOSIS — I10 Essential (primary) hypertension: Secondary | ICD-10-CM

## 2019-08-25 DIAGNOSIS — I5032 Chronic diastolic (congestive) heart failure: Secondary | ICD-10-CM

## 2019-08-25 DIAGNOSIS — I480 Paroxysmal atrial fibrillation: Secondary | ICD-10-CM

## 2019-08-25 DIAGNOSIS — E782 Mixed hyperlipidemia: Secondary | ICD-10-CM

## 2019-08-25 DIAGNOSIS — I2510 Atherosclerotic heart disease of native coronary artery without angina pectoris: Secondary | ICD-10-CM

## 2019-08-25 NOTE — Patient Instructions (Signed)

## 2019-08-26 NOTE — Progress Notes (Signed)
Hearing Screening   125Hz  250Hz  500Hz  1000Hz  2000Hz  3000Hz  4000Hz  6000Hz  8000Hz   Right ear:   20 20 20  20     Left ear:   20 20 20  25     Vision Screening Comments: Refused, plans to make an appointment

## 2019-08-27 ENCOUNTER — Other Ambulatory Visit: Payer: Self-pay

## 2019-08-27 ENCOUNTER — Encounter: Payer: Self-pay | Admitting: Internal Medicine

## 2019-08-27 ENCOUNTER — Ambulatory Visit (INDEPENDENT_AMBULATORY_CARE_PROVIDER_SITE_OTHER): Payer: Medicare Other | Admitting: Internal Medicine

## 2019-08-27 VITALS — BP 152/70 | HR 66 | Ht 63.0 in | Wt 344.0 lb

## 2019-08-27 DIAGNOSIS — F321 Major depressive disorder, single episode, moderate: Secondary | ICD-10-CM | POA: Diagnosis not present

## 2019-08-27 DIAGNOSIS — F1120 Opioid dependence, uncomplicated: Secondary | ICD-10-CM

## 2019-08-27 DIAGNOSIS — Z0000 Encounter for general adult medical examination without abnormal findings: Secondary | ICD-10-CM | POA: Diagnosis not present

## 2019-08-27 DIAGNOSIS — I480 Paroxysmal atrial fibrillation: Secondary | ICD-10-CM

## 2019-08-27 DIAGNOSIS — I2510 Atherosclerotic heart disease of native coronary artery without angina pectoris: Secondary | ICD-10-CM | POA: Diagnosis not present

## 2019-08-27 DIAGNOSIS — I5032 Chronic diastolic (congestive) heart failure: Secondary | ICD-10-CM

## 2019-08-27 DIAGNOSIS — Z7189 Other specified counseling: Secondary | ICD-10-CM

## 2019-08-27 DIAGNOSIS — I25119 Atherosclerotic heart disease of native coronary artery with unspecified angina pectoris: Secondary | ICD-10-CM | POA: Diagnosis not present

## 2019-08-27 DIAGNOSIS — J479 Bronchiectasis, uncomplicated: Secondary | ICD-10-CM

## 2019-08-27 LAB — LIPID PANEL
Cholesterol: 113 mg/dL (ref 0–200)
HDL: 43.7 mg/dL (ref >39.00)
LDL Cholesterol: 56 mg/dL (ref 0–99)
NonHDL: 69.67
Total CHOL/HDL Ratio: 3
Triglycerides: 70 mg/dL (ref 0.0–149.0)
VLDL: 14 mg/dL (ref 0.0–40.0)

## 2019-08-27 LAB — COMPREHENSIVE METABOLIC PANEL
ALT: 10 U/L (ref 0–35)
AST: 18 U/L (ref 0–37)
Albumin: 4.2 g/dL (ref 3.5–5.2)
Alkaline Phosphatase: 106 U/L (ref 39–117)
BUN: 14 mg/dL (ref 6–23)
CO2: 25 mEq/L (ref 19–32)
Calcium: 10.5 mg/dL (ref 8.4–10.5)
Chloride: 103 mEq/L (ref 96–112)
Creatinine, Ser: 0.88 mg/dL (ref 0.40–1.20)
GFR: 63.61 mL/min (ref >60.00)
Glucose, Bld: 104 mg/dL — ABNORMAL HIGH (ref 70–99)
Potassium: 4 mEq/L (ref 3.5–5.1)
Sodium: 140 mEq/L (ref 135–145)
Total Bilirubin: 1.3 mg/dL — ABNORMAL HIGH (ref 0.2–1.2)
Total Protein: 6.8 g/dL (ref 6.0–8.3)

## 2019-08-27 LAB — CBC
HCT: 37.2 % (ref 36.0–46.0)
Hemoglobin: 12.2 g/dL (ref 12.0–15.0)
MCHC: 32.7 g/dL (ref 30.0–36.0)
MCV: 86.7 fl (ref 78.0–100.0)
Platelets: 191 10*3/uL (ref 150.0–400.0)
RBC: 4.28 Mil/uL (ref 3.87–5.11)
RDW: 15.4 % (ref 11.5–15.5)
WBC: 7.6 10*3/uL (ref 4.0–10.5)

## 2019-08-27 LAB — T4, FREE: Free T4: 0.98 ng/dL (ref 0.60–1.60)

## 2019-08-27 MED ORDER — DULOXETINE HCL 30 MG PO CPEP: 30.0000 mg | ORAL_CAPSULE | Freq: Every day | ORAL | 3 refills | Status: DC

## 2019-08-27 NOTE — Assessment & Plan Note (Signed)
See social history 

## 2019-08-27 NOTE — Addendum Note (Signed)
Addended by: Minna Merritts on: 08/27/2019 12:51 PM   Modules accepted: Level of Service

## 2019-08-27 NOTE — Assessment & Plan Note (Signed)
Regular now On pradaxa

## 2019-08-27 NOTE — Progress Notes (Signed)
Subjective:    Patient ID: Tina Pacheco, female    DOB: 1950-04-26, 69 y.o.   MRN: [id number]  HPI Here for Medicare wellness visit and follow up of chronic health conditions This visit occurred during the SARS-CoV-2 public health emergency.  Safety protocols were in place, including screening questions prior to the visit, additional usage of staff PPE, and extensive cleaning of exam room while observing appropriate contact time as indicated for disinfecting solutions.   Reviewed advanced directives Reviewed other doctors--Dr Gollan--cardiology, Dr Bock--rheumatology, Dr Heide Scales, Dr Oren Bracket, Dr Moye?--derm Was hospitalized in January-February for CHF No surgery Has safe step tub recently--easier to shower She does ADLs but not really housework Walks with rollator---can't do stairs No alcohol or tobacco Vision and hearing are okay Fell once--slid to floor. No injury Now is depressed No apparent memory issues  Visit 2 days ago with Dr Rockey Situ Has gained more weight Diuresing okay but he thinks it may be fluid Will actually get perineal rash and incontinence (so she can't even get out after the meds)  She now feels more depressed for 2.5-3 months Some anhedonia Feels bad every day No suicidal thoughts She still thinks the trigger for this was her dog dying  Ongoing pain with RA Still on lefunomide and hydroxychloroquine Brief prednisone burst---part of the weight gain  No chest pain Some SOB at times--even at rest Some PND Ongoing edema No regular cough Did have bronchiectasis on CT scan   Current Outpatient Medications on File Prior to Visit  Medication Sig Dispense Refill  . amLODipine (NORVASC) 5 MG tablet TAKE 1 TABLET BY MOUTH EVERY DAY IN THE EVENING 90 tablet 3  . atorvastatin (LIPITOR) 20 MG tablet TAKE 1 TABLET DAILY AT 6PM 90 tablet 3  . diphenhydrAMINE (BENADRYL) 25 MG tablet Take 25 mg by mouth daily as needed for allergies.    . folic acid  (FOLVITE) 1 MG tablet Take 1 mg by mouth daily.    Marland Kitchen gabapentin (NEURONTIN) 300 MG capsule Take 1 capsule 3 times a day (Take 1 capsule 2-3 hours before bedtime) 90 capsule 11  . hydroxychloroquine (PLAQUENIL) 200 MG tablet Take 200 mg by mouth 2 (two) times daily.     Marland Kitchen leflunomide (ARAVA) 20 MG tablet Take 20 mg by mouth every morning.     . meclizine (ANTIVERT) 25 MG tablet Take 1 tablet (25 mg total) by mouth 3 (three) times daily as needed for dizziness. 90 tablet 5  . meloxicam (MOBIC) 7.5 MG tablet Take 7.5 mg by mouth 2 (two) times daily.    . metoprolol succinate (TOPROL-XL) 50 MG 24 hr tablet TAKE 1 TABLET BY MOUTH EVERY DAY WITH OR IMMEDIATELY FOLLOWING A MEAL (Patient taking differently: Take 50 mg by mouth at bedtime. ) 90 tablet 3  . montelukast (SINGULAIR) 10 MG tablet TAKE 1 TABLET BY MOUTH AT BEDTIME 90 tablet 3  . Multiple Vitamin (MULTIVITAMIN) tablet Take 1 tablet by mouth daily.     . Oxycodone HCl 10 MG TABS Take 2 tablets (20 mg total) by mouth 2 (two) times daily as needed (moderate pain). Patient is taking 2 tablets in the morning and 2 tablets at bedtime 120 tablet 0  . polyethylene glycol (MIRALAX / GLYCOLAX) packet Take 17 g by mouth daily as needed for mild constipation. 14 each 0  . potassium chloride SA (KLOR-CON) 20 MEQ tablet TAKE 1/2 TABLET BY MOUTH TWICE A DAY(MAY TAKE AN EXTRA TABLET WHEN TAKING TORSEMIDE) 180 tablet 3  .  PRADAXA 150 MG CAPS capsule TAKE 1 CAPSULE TWICE DAILY 180 capsule 0  . SF 5000 PLUS 1.1 % CREA dental cream Place 1 application onto teeth 2 (two) times daily.  1  . sodium chloride (OCEAN) 0.65 % SOLN nasal spray Place 1 spray into both nostrils as needed for congestion.    Marland Kitchen terazosin (HYTRIN) 10 MG capsule Take 1 capsule (10 mg total) by mouth at bedtime. 90 capsule 3  . tolterodine (DETROL LA) 2 MG 24 hr capsule TAKE 1 CAPSULE(2 MG) BY MOUTH DAILY AS NEEDED FOR BLADDER SPASMS 90 capsule 3  . torsemide (DEMADEX) 20 MG tablet Take 20 mg by  mouth twice daily as needed for swelling (Patient taking differently: Take 20 mg by mouth daily as needed. Take 20 mg by mouth twice daily as needed for swelling) 180 tablet 3   No current facility-administered medications on file prior to visit.    Allergies  Allergen Reactions  . Codeine     Nausea and vomiting    Past Medical History:  Diagnosis Date  . Acute CHF (congestive heart failure) ([place]) 03/30/2019  . Arthritis    RA  . Asthma   . Basal cell carcinoma   . Cataract 2018   bilateral eyes; corrected with surgery  . Claustrophobia   . Collagen vascular disease (H[facility])    RA  . COPD (chronic obstructive pulmonary disease) ([place])   . Diastolic dysfunction    a. echo 07/2014: EF 55-60%, no RWMA, GR2DD, mild MR, LA moderately dilated, PASP 38 mm Hg  . Dyslipidemia   . Dysrhythmia   . Headache    migraines  . Hemihypertrophy   . History of cardiac cath    a. 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 History of gout   . Hyperplastic colonic polyp 2003  . Hypertension   . Hypokalemia   . Morbid obesity ([place])   . PAF (paroxysmal atrial fibrillation) (HCC)    a. on Pradaxa; b. CHADSVASc at least 2 (HTN & female)  . Rheumatoid arthritis(714.0)   . Sleep apnea    a. not compliant with CPAP    Past Surgical History:  Procedure Laterality Date  . ABDOMINAL HYSTERECTOMY    . APPLICATION OF WOUND VAC Right 08/09/2017   Procedure: APPLICATION OF WOUND VAC;  Surgeon: Albertine Patricia, DPM;  Location: [facility] ORS;  Service: Podiatry;  Laterality: Right;  . CARDIAC CATHETERIZATION  05/24/2010   nonobstructive CAD  . CATARACT EXTRACTION W/ INTRAOCULAR LENS IMPLANT Right 06/12/2016   Dr. Darleen Crocker  . CATARACT EXTRACTION W/ INTRAOCULAR LENS IMPLANT Left 06/26/2016   Dr. Darleen Crocker  . CESAREAN SECTION    . CHOLECYSTECTOMY    . COLONOSCOPY  06/12/2011   Procedure: COLONOSCOPY;  Surgeon: Juanita Craver, MD;  Location: [facility];  Service: Endoscopy;  Laterality: N/A;  .  COLONOSCOPY N/A 03/17/2013   Procedure: COLONOSCOPY;  Surgeon: Juanita Craver, MD;  Location: [facility];  Service: Endoscopy;  Laterality: N/A;  . EYE SURGERY    . FOOT ARTHRODESIS Right 07/13/2017   Procedure: ARTHRODESIS FOOT-MULTI.FUSIONS (6 JOINTS);  Surgeon: Albertine Patricia, DPM;  Location: [facility] OR[facility];  Service: Podiatry;  Laterality: Right;  . IRRIGATION AND DEBRIDEMENT FOOT Right 08/09/2017   Procedure: IRRIGATION AND DEBRIDEMENT FOOT;  Surgeon: Albertine Patricia, DPM;  Location: [facility];  Service: Podiatry;  Laterality: Right;  . KNEE ARTHROSCOPY     bilateral  . SINUSOTOMY    . TOOTH EXTRACTION  12/2016  . VAGINAL HYSTERECTOMY  Family History  Problem Relation Age of Onset  . Emphysema Father        smoked  . Tuberculosis Mother   . Parkinsonism Mother   . Diabetes type II Sister   . Breast cancer Sister   . Breast cancer Maternal Aunt   . Tuberculosis Sister     Social History   Socioeconomic History  . Marital status: Married    Spouse name: Not on file  . Number of children: 1  . Years of education: Not on file  . Highest education level: Not on file  Occupational History  . 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 Smoking status: Never Smoker  . Smokeless tobacco: Never Used  Vaping Use  . Vaping Use: Never used  Substance and Sexual Activity  . Alcohol use: No  . Drug use: No  . Sexual activity: Not on file  Other Topics Concern  . Not on file  Social History Narrative   No living will   Husband, then daughter should be decision maker   Would accept resuscitation attempts   Not sure about tube feeds   Social Determinants of Health   Financial Resource Strain:   . Difficulty of Paying Living Expenses:   Food Insecurity: No Food Insecurity  . Worried About Charity fundraiser in the Last Year: Never true  . Ran Out of Food in the Last Year: Never true  Transportation Needs: No Transportation Needs    . Lack of Transportation (Medical): No  . Lack of Transportation (Non-Medical): No  Physical Activity:   . Days of Exercise per Week:   . Minutes of Exercise per Session:   Stress:   . Feeling of Stress :   Social Connections:   . Frequency of Communication with Friends and Family:   . Frequency of Social Gatherings with Friends and Family:   . Attends Religious Services:   . Active Member of Clubs or Organizations:   . Attends Archivist Meetings:   Marland Kitchen Marital Status:   Intimate Partner Violence:   . Fear of Current or Ex-Partner:   . Emotionally Abused:   Marland Kitchen Physically Abused:   . Sexually Abused:    Review of Systems Has been eating more due to the depression Weight up 15# in past 3 months Not sleeping Wears seat belt Ongoing problems with teeth---may need extractions Does see derm--no suspicious lesions now No heartburn or dysphagia (other than some trouble with pills) Bowels are okay. Does see some blood on toilet paper from hemorrhoids Some AM headache--meclizine helps    Objective:   Physical Exam Constitutional:      General: She is not in acute distress.    Appearance: Normal appearance. She is obese.  HENT:     Head: Normocephalic and atraumatic.     Mouth/Throat:     Mouth: Mucous membranes are moist.     Comments: No oral lesions Cardiovascular:     Rate and Rhythm: Normal rate and regular rhythm.     Heart sounds: No murmur heard.  No gallop.      Comments: Faint pedal pulses Pulmonary:     Breath sounds: Normal breath sounds. No wheezing or rales.     Comments: Gets SOB even bending over Abdominal:     Palpations: Abdomen is soft.     Tenderness: There is no abdominal tenderness.  Musculoskeletal:     Comments: 3+ tense  edema in both calves/feet Active synovitis in left wrist  Lymphadenopathy:     Cervical: No cervical adenopathy.  Skin:    Findings: No rash.     Comments: No foot ulcers  Neurological:     Mental Status: She is  oriented to person, place, and time.     Comments: President--- "Tina Pacheco, Obama" [id number] Recall 3/3  Psychiatric:        Behavior: Behavior normal.     Comments: depressed            Assessment & Plan:

## 2019-08-27 NOTE — Assessment & Plan Note (Signed)
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

## 2019-08-27 NOTE — Assessment & Plan Note (Signed)
DOE may be anginal equivalent Keeps up with Dr Rockey Situ

## 2019-08-27 NOTE — Assessment & Plan Note (Signed)
I have personally reviewed the Medicare Annual Wellness questionnaire and have noted 1. The patient's medical and social history 2. Their use of alcohol, tobacco or illicit drugs 3. Their current medications and supplements 4. The patient's functional ability including ADL's, fall risks, home safety risks and hearing or visual             impairment. 5. Diet and physical activities 6. Evidence for depression or mood disorders  The patients weight, height, BMI and visual acuity have been recorded in the chart I have made referrals, counseling and provided education to the patient based review of the above and I have provided the pt with a written personalized care plan for preventive services.  I have provided you with a copy of your personalized plan for preventive services. Please take the time to review along with your updated medication list.  Flu vaccine in fall Consider shingrix at pharmacy Colon due 2025 Not able to exercise Had COVID vaccine Mammogram done in January No pap due to age

## 2019-08-27 NOTE — Assessment & Plan Note (Signed)
Seen on chest CTs as far back as 2017 No current symptoms No pulmonologist for now--will hold off unless more problems

## 2019-08-27 NOTE — Assessment & Plan Note (Signed)
PDMP reviewed No concerns 

## 2019-08-27 NOTE — Assessment & Plan Note (Signed)
Weight up considerably Needs to take the diuretics consistently

## 2019-08-27 NOTE — Assessment & Plan Note (Signed)
BMI is up Some is fluid She is going to try to be more careful with eating (treat the depression)

## 2019-08-28 ENCOUNTER — Other Ambulatory Visit: Payer: Self-pay | Admitting: *Deleted

## 2019-08-28 ENCOUNTER — Encounter: Payer: Self-pay | Admitting: *Deleted

## 2019-08-28 NOTE — Patient Outreach (Signed)
[facility]) Care Management [place] Telephone Outreach  08/28/2019  [facility] PHA 09/15/1950 [id number]  Successful telephone outreach to [name], 69 y/o female referred initially to [facility] RN CM for transition of care after hospitalization January 31- April 04, 2019 for weakness, multiple falls at home, shortness of breath/ acute on chronic CHF. Patient was discharged to SNF for rehabilitation and was subsequently discharged from SNF to home on April 10, 2019.  Patient has history including, but not limited to, morbid obesity; HTN/ HLD; chronic pain related to osteoarthritis/ rheumatoid arthritis; ongoing use of narcotics; p- Atrial fibrillation; CHF; and COPD.  HIPAA/ identity verified. Patient reports "just not doingvery good;" stating that she is having a "bad day" related to ongoing chronic bilateral shoulder pain.  She denies new/ recent falls and reports that she continues using rollator walker for ambulation. She sounds to be in no distress throughout phone call today.  Patient further reports:  -- attended recent PCP and cardiology provider appointments; reviewed with patient post-office instructions and medication changes; confirms that she discussed her lack of motivation to follow prescribed plan of care for CHF; confirms that she has initiated antidepressant as prescribed to help with lack of motivation to follow recommended plan of care ---- continues to deny depression; states she has "normal sadness," but her "main problem" is that she "really just does not care" about "doing the things I am supposed to do;" she declines desire to join support groups for weight loss and reports "I know it's just me, I just get tired and don't want to do what I know I should." ---- has had difficulty adhering to prescribed medication therapy for CHF- "It just sends me to the bathroom all day, I can't do anything else" from being in bathroom so much; reports discussed with  cardiology provider who "does not want to change my medicines."  States that she is going to re-try resuming diuretic therapy as prescribed now that she is taking antidepressant to help her "get motivated" ---- follow up appointment with PCP to assess antidepressant effectiveness September 24, 2019  -- daily weights at home "staying around 350 lbs" (baseline) --- reiterated previously provided education around signs/ symptoms with corresponding action plan for CHF exacerbation; patient able to verbalize some; continues to require ongoing reinforcement of same; denies clinical concerns around breathing today ---- not consistently adhering to low salt heart healthy diet, again stating lack of motivation; continues to report "good days/ bad days" with recommended dietary adherence    Patient denies further issues, concerns, or problems today. Iconfirmed that patient hasmy direct phone number, the main [facility] CM office phone number, and the [facility] CM 24-hour nurse advice phone number should issues arise prior to next scheduled [facility] CM outreach next month post- follow up PCP office visit.  Again discussed with patient possibility of transfer to [facility] at time of next outreach, patient is agreeable.  Encouraged patient to contact me directly if needs, questions, issues, or concerns arise prior to next scheduled outreach; patient agreed to do so.  Plan:  Patient will take medications as prescribed and will attend all scheduled provider appointments  Patient will promptly notify care providers for any new concerns/ issues/ problems that arise  Patient willcontinuemonitoring/ recording daily weightsand will follow established action plan for weight gain  Patient will continue reviewing signs/ symptoms CHF zones daily  Patient will continue taking steps to incorporate low sodium diet into her ongoing routine  [facility] CM  outreachto continue with scheduled phone callnext month, unless  indicated sooner  Oneta Rack, RN, BSN, [facility]  [phone]

## 2019-09-04 ENCOUNTER — Other Ambulatory Visit: Payer: Self-pay | Admitting: Internal Medicine

## 2019-09-11 ENCOUNTER — Other Ambulatory Visit: Payer: Self-pay

## 2019-09-11 DIAGNOSIS — M1990 Unspecified osteoarthritis, unspecified site: Secondary | ICD-10-CM

## 2019-09-11 DIAGNOSIS — G894 Chronic pain syndrome: Secondary | ICD-10-CM

## 2019-09-11 MED ORDER — OXYCODONE HCL 10 MG PO TABS: 20.0000 mg | ORAL_TABLET | Freq: Two times a day (BID) | ORAL | 0 refills | Status: DC | PRN

## 2019-09-11 NOTE — Telephone Encounter (Signed)
Name of Medication: oxycodone 10 mg Name of Pharmacy:[facility] [address] Last Fill or Written Date and Quantity: 08-14-19 #120 Last Office Visit and Type: 08-27-19  Next Office Visit and Type: 09-24-19 Last Controlled Substance Agreement Date:05/23/19  Last UDS:05/14/19

## 2019-09-19 DIAGNOSIS — M1A00X0 Idiopathic chronic gout, unspecified site, without tophus (tophi): Secondary | ICD-10-CM | POA: Diagnosis not present

## 2019-09-19 DIAGNOSIS — M170 Bilateral primary osteoarthritis of knee: Secondary | ICD-10-CM | POA: Diagnosis not present

## 2019-09-19 DIAGNOSIS — Z79899 Other long term (current) drug therapy: Secondary | ICD-10-CM | POA: Diagnosis not present

## 2019-09-19 DIAGNOSIS — M0579 Rheumatoid arthritis with rheumatoid factor of multiple sites without organ or systems involvement: Secondary | ICD-10-CM | POA: Diagnosis not present

## 2019-09-24 ENCOUNTER — Other Ambulatory Visit: Payer: Self-pay

## 2019-09-24 ENCOUNTER — Encounter: Payer: Self-pay | Admitting: Internal Medicine

## 2019-09-24 ENCOUNTER — Ambulatory Visit (INDEPENDENT_AMBULATORY_CARE_PROVIDER_SITE_OTHER): Payer: Medicare Other | Admitting: Internal Medicine

## 2019-09-24 DIAGNOSIS — F321 Major depressive disorder, single episode, moderate: Secondary | ICD-10-CM

## 2019-09-24 DIAGNOSIS — I2510 Atherosclerotic heart disease of native coronary artery without angina pectoris: Secondary | ICD-10-CM | POA: Diagnosis not present

## 2019-09-24 MED ORDER — DULOXETINE HCL 60 MG PO CPEP: 60.0000 mg | ORAL_CAPSULE | Freq: Every day | ORAL | 3 refills | Status: DC

## 2019-09-24 NOTE — Progress Notes (Signed)
Subjective:    Patient ID: Tina Pacheco, female    DOB: 03/06/1950, 69 y.o.   MRN: [id number]  HPI Here for follow up of worsened depression This visit occurred during the SARS-CoV-2 public health emergency.  Safety protocols were in place, including screening questions prior to the visit, additional usage of staff PPE, and extensive cleaning of exam room while observing appropriate contact time as indicated for disinfecting solutions.   Can't even tell she is taking the new medication Depression daily Sleep is very bad--- can fall asleep for hours in the day, and still sleep at night Other nights--can't sleep even without daytime napping  Husband hasn't been cooking as much Eating okay--simple like sandwiches, etc  She was considering a new dog--but husband said no (she really can't take care of a dog)  Current Outpatient Medications on File Prior to Visit  Medication Sig Dispense Refill  . amLODipine (NORVASC) 5 MG tablet TAKE 1 TABLET BY MOUTH EVERY DAY IN THE EVENING 90 tablet 3  . atorvastatin (LIPITOR) 20 MG tablet TAKE 1 TABLET DAILY AT 6PM 90 tablet 3  . diphenhydrAMINE (BENADRYL) 25 MG tablet Take 25 mg by mouth daily as needed for allergies.    . DULoxetine (CYMBALTA) 30 MG capsule Take 1 capsule (30 mg total) by mouth daily. 30 capsule 3  . folic acid (FOLVITE) 1 MG tablet Take 1 mg by mouth daily.    Marland Kitchen gabapentin (NEURONTIN) 300 MG capsule Take 1 capsule 3 times a day (Take 1 capsule 2-3 hours before bedtime) 90 capsule 11  . hydroxychloroquine (PLAQUENIL) 200 MG tablet Take 200 mg by mouth 2 (two) times daily.     Marland Kitchen leflunomide (ARAVA) 20 MG tablet Take 20 mg by mouth every morning.     . meclizine (ANTIVERT) 25 MG tablet Take 1 tablet (25 mg total) by mouth 3 (three) times daily as needed for dizziness. 90 tablet 5  . metoprolol succinate (TOPROL-XL) 50 MG 24 hr tablet TAKE 1 TABLET BY MOUTH EVERY DAY WITH OR IMMEDIATELY FOLLOWING A MEAL (Patient taking differently:  Take 50 mg by mouth at bedtime. ) 90 tablet 3  . montelukast (SINGULAIR) 10 MG tablet TAKE 1 TABLET BY MOUTH AT BEDTIME 90 tablet 3  . Multiple Vitamin (MULTIVITAMIN) tablet Take 1 tablet by mouth daily.     . Oxycodone HCl 10 MG TABS Take 2 tablets (20 mg total) by mouth 2 (two) times daily as needed (moderate pain). Patient is taking 2 tablets in the morning and 2 tablets at bedtime 120 tablet 0  . polyethylene glycol (MIRALAX / GLYCOLAX) packet Take 17 g by mouth daily as needed for mild constipation. 14 each 0  . potassium chloride SA (KLOR-CON) 20 MEQ tablet TAKE 1/2 TABLET BY MOUTH TWICE A DAY(MAY TAKE AN EXTRA TABLET WHEN TAKING TORSEMIDE) 180 tablet 3  . PRADAXA 150 MG CAPS capsule TAKE 1 CAPSULE TWICE DAILY 180 capsule 0  . SF 5000 PLUS 1.1 % CREA dental cream Place 1 application onto teeth 2 (two) times daily.  1  . sodium chloride (OCEAN) 0.65 % SOLN nasal spray Place 1 spray into both nostrils as needed for congestion.    Marland Kitchen terazosin (HYTRIN) 10 MG capsule Take 1 capsule (10 mg total) by mouth at bedtime. 90 capsule 3  . tolterodine (DETROL LA) 2 MG 24 hr capsule TAKE 1 CAPSULE(2 MG) BY MOUTH DAILY AS NEEDED FOR BLADDER SPASMS 90 capsule 3  . torsemide (DEMADEX) 20 MG tablet Take  20 mg by mouth twice daily as needed for swelling (Patient taking differently: Take 20 mg by mouth daily as needed. Take 20 mg by mouth twice daily as needed for swelling) 180 tablet 3   No current facility-administered medications on file prior to visit.    Allergies  Allergen Reactions  . Codeine     Nausea and vomiting    Past Medical History:  Diagnosis Date  . Acute CHF (congestive heart failure) ([place]) 03/30/2019  . Arthritis    RA  . Asthma   . Basal cell carcinoma   . Cataract 2018   bilateral eyes; corrected with surgery  . Claustrophobia   . Collagen vascular disease (HCC)    RA  . COPD (chronic obstructive pulmonary disease) ([place])   . Diastolic dysfunction    a. echo 07/2014: EF  55-60%, no RWMA, GR2DD, mild MR, LA moderately dilated, PASP 38 mm Hg  . Dyslipidemia   . Dysrhythmia   . Headache    migraines  . Hemihypertrophy   . History of cardiac cath    a. 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 History of gout   . Hyperplastic colonic polyp 2003  . Hypertension   . Hypokalemia   . Morbid obesity ([place])   . PAF (paroxysmal atrial fibrillation) (HCC)    a. on Pradaxa; b. CHADSVASc at least 2 (HTN & female)  . Rheumatoid arthritis(714.0)   . Sleep apnea    a. not compliant with CPAP    Past Surgical History:  Procedure Laterality Date  . ABDOMINAL HYSTERECTOMY    . APPLICATION OF WOUND VAC Right 08/09/2017   Procedure: APPLICATION OF WOUND VAC;  Surgeon: Albertine Patricia, DPM;  Location: [facility] ORS;  Service: Podiatry;  Laterality: Right;  . CARDIAC CATHETERIZATION  05/24/2010   nonobstructive CAD  . CATARACT EXTRACTION W/ INTRAOCULAR LENS IMPLANT Right 06/12/2016   Dr. Darleen Crocker  . CATARACT EXTRACTION W/ INTRAOCULAR LENS IMPLANT Left 06/26/2016   Dr. Darleen Crocker  . CESAREAN SECTION    . CHOLECYSTECTOMY    . COLONOSCOPY  06/12/2011   Procedure: COLONOSCOPY;  Surgeon: Juanita Craver, MD;  Location: [facility];  Service: Endoscopy;  Laterality: N/A;  . COLONOSCOPY N/A 03/17/2013   Procedure: COLONOSCOPY;  Surgeon: Juanita Craver, MD;  Location: [facility];  Service: Endoscopy;  Laterality: N/A;  . EYE SURGERY    . FOOT ARTHRODESIS Right 07/13/2017   Procedure: ARTHRODESIS FOOT-MULTI.FUSIONS (6 JOINTS);  Surgeon: Albertine Patricia, DPM;  Location: [facility] OR[facility];  Service: Podiatry;  Laterality: Right;  . IRRIGATION AND DEBRIDEMENT FOOT Right 08/09/2017   Procedure: IRRIGATION AND DEBRIDEMENT FOOT;  Surgeon: Albertine Patricia, DPM;  Location: [facility];  Service: Podiatry;  Laterality: Right;  . KNEE ARTHROSCOPY     bilateral  . SINUSOTOMY    . TOOTH EXTRACTION  12/2016  . VAGINAL HYSTERECTOMY      Family History  Problem Relation Age of Onset  .  Emphysema Father        smoked  . Tuberculosis Mother   . Parkinsonism Mother   . Diabetes type II Sister   . Breast cancer Sister   . Breast cancer Maternal Aunt   . Tuberculosis Sister     Social History   Socioeconomic History  . Marital status: Married    Spouse name: Not on file  . Number of children: 1  . Years of education: Not on file  . Highest education level: Not on file  Occupational History  . Occupation:  Taxpayer service    Employer: [employer]    Comment: Retired 2007  Tobacco Use  . Smoking status: Never Smoker  . Smokeless tobacco: Never Used  Vaping Use  . Vaping Use: Never used  Substance and Sexual Activity  . Alcohol use: No  . Drug use: No  . Sexual activity: Not on file  Other Topics Concern  . Not on file  Social History Narrative   No living will   Husband, then daughter should be decision maker   Would accept resuscitation attempts   Not sure about tube feeds   Social Determinants of Health   Financial Resource Strain:   . Difficulty of Paying Living Expenses:   Food Insecurity: No Food Insecurity  . Worried About Charity fundraiser in the Last Year: Never true  . Ran Out of Food in the Last Year: Never true  Transportation Needs: No Transportation Needs  . Lack of Transportation (Medical): No  . Lack of Transportation (Non-Medical): No  Physical Activity:   . Days of Exercise per Week:   . Minutes of Exercise per Session:   Stress:   . Feeling of Stress :   Social Connections:   . Frequency of Communication with Friends and Family:   . Frequency of Social Gatherings with Friends and Family:   . Attends Religious Services:   . Active Member of Clubs or Organizations:   . Attends Archivist Meetings:   Marland Kitchen Marital Status:   Intimate Partner Violence:   . Fear of Current or Ex-Partner:   . Emotionally Abused:   Marland Kitchen Physically Abused:   . Sexually Abused:    Review of Systems Pain is about the same---did have rheumatology  follow up (now with new issues with arms and ?ganglion)    Objective:   Physical Exam Constitutional:      Appearance: Normal appearance.  Neurological:     Mental Status: She is alert.  Psychiatric:     Comments: Mild depressed mood Normal appearance and speech            Assessment & Plan:

## 2019-09-24 NOTE — Assessment & Plan Note (Signed)
No real effect from the duloxetine--but no side effects Will increase to 60mg  now Consider increase to 90 in 3-4 weeks----she will let me know

## 2019-09-24 NOTE — Patient Instructions (Signed)
Please let me know if you have any problems with the higher dose (60mg ) of the duloxetine. If you are not better with the depression in 3-4 weeks, send me an email and we can try increasing to 90mg 

## 2019-09-29 ENCOUNTER — Encounter: Payer: Self-pay | Admitting: *Deleted

## 2019-09-29 ENCOUNTER — Other Ambulatory Visit: Payer: Self-pay | Admitting: *Deleted

## 2019-09-29 NOTE — Patient Outreach (Signed)
[place] [facility]) Care Management [facility] CM Telephone Outreach Case closure- transfer to [place]  09/29/2019  EMALIA WITKOP 01-29-1951 [id number]  Successful telephone outreach to [name], 69 y/o female referred [facility] to [facility] RN CM for transition of care after hospitalization January 31- April 04, 2019 for weakness, multiple falls at home, shortness of breath/ acute on chronic CHF. Patient was discharged to SNF for rehabilitation and was subsequently discharged from SNF to home on April 10, 2019.  Patient has history including, but not limited to, morbid obesity; HTN/ HLD; chronic pain related to osteoarthritis/ rheumatoid arthritis; ongoing use of narcotics; p- Atrial fibrillation; CHF; and COPD.  HIPAA/ identity verified. Patient reports "not having a good day;" due to chronic headache with nausea.  She denies new/ recent falls and reports that she continues using rollator walker for ambulation. She sounds to be in no distress throughout phone call today.  Patient further reports:  -- attended recent PCP office visit and discussed depression as previously suggested; antidepressant medication increased; patient reports "thinks" it is "starting to work;" however, continues to report "lack of motivation" around doing the things she wants to do, such as following plan of care for daily weights/ taking diuretics; going to her pool; walking around her home; states, "that is just who I am." -- full medication review completed: no discrepancies or concerns identified -- continues trying to follow appropriate diet for CHF, however continues to admit that she loves salty food and has to watch her intake closely-- this was encouraged  -- performing daily weights at home "about every day; weight has come down some;" previously reported daily weights > 350 lbs, now down to "336 lbs" this morning, and "staying in that range;" declines additional review of weight values  today --- reiterated previously provided education around signs/ symptoms with corresponding action plan for CHF exacerbation; patient able to verbalize some; continues to require ongoing reinforcement of same; denies clinical concerns around breathing today ---- as noted above, not consistently adhering to low salt heart healthy diet, again stating lack of motivation; continues to report "good days/ bad days" with recommended dietary adherence    Patient denies further issues, concerns, or problems today. Again discussed with patient possibility of transfer to [place], patient is agreeable to this today.Encouraged patient to contact me directly if needs, questions, issues, or concerns arise prior to [place] outreach next month; patient agreed to do so.  Plan:  Patient will take medications as prescribed and will attend all scheduled provider appointments  Patient will promptly notify care providers for any new concerns/ issues/ problems that arise  Patient willcontinuemonitoring/ recording daily weightsand will follow established action plan for weight gain  Patient willcontinuereviewingsigns/ symptoms CHF zones daily  Patient willcontinuetaking steps to incorporate low sodium diet into her ongoing routine  I will place referral for [place] and make PCP aware of same- will send [facility] CM discipline closure letter as quarterly update/ summary (previously sent 07/07/19)  It has been a pleasure caring for [place],  Oneta Rack, RN, BSN, [place]  [phone]

## 2019-10-06 ENCOUNTER — Other Ambulatory Visit: Payer: Medicare Other

## 2019-10-10 ENCOUNTER — Telehealth: Payer: Self-pay | Admitting: Internal Medicine

## 2019-10-10 DIAGNOSIS — G894 Chronic pain syndrome: Secondary | ICD-10-CM

## 2019-10-10 DIAGNOSIS — M1990 Unspecified osteoarthritis, unspecified site: Secondary | ICD-10-CM

## 2019-10-10 MED ORDER — OXYCODONE HCL 10 MG PO TABS: 20.0000 mg | ORAL_TABLET | Freq: Two times a day (BID) | ORAL | 0 refills | Status: DC | PRN

## 2019-10-10 MED ORDER — OXYCODONE HCL 20 MG PO TABS: 1.0000 | ORAL_TABLET | Freq: Two times a day (BID) | ORAL | 0 refills | Status: DC

## 2019-10-10 NOTE — Telephone Encounter (Signed)
Pt needs refill to oxycodone sent to [place].

## 2019-10-10 NOTE — Addendum Note (Signed)
Addended by: Pilar Grammes on: 10/10/2019 08:26 AM   Modules accepted: Orders

## 2019-10-10 NOTE — Telephone Encounter (Signed)
Pt called stating [facility] does not 10mg  of oxycodone.  They wanted to substitute 20mg  instead 10mg .  Pt is ok with this

## 2019-10-10 NOTE — Telephone Encounter (Signed)
rx sent. Routed to PCP as FYI.

## 2019-10-10 NOTE — Telephone Encounter (Signed)
Oxycodone 10mg  is on backorder. I spoke to Dr Silvio Pate by telephone and he is fine with allowing a substitution to 20mg  and cut in half or 5mg  and double. He asked if another provider would send it in for him as he is away from the office and not at a computer.

## 2019-10-10 NOTE — Telephone Encounter (Signed)
Name of Medication:oxycodone 10 mg Name of Pharmacy:[facility] [address] Last Fill or Written Date and Quantity: 09-11-19 #120 Last Office Visit and Type:08-27-19  Next Office Visit and Type:09-24-19 Last Controlled Substance Agreement Date:05/23/19 Last UDS:05/14/19

## 2019-10-10 NOTE — Addendum Note (Signed)
Addended by: Tonia Ghent on: 10/10/2019 03:27 PM   Modules accepted: Orders

## 2019-10-10 NOTE — Addendum Note (Signed)
Addended by: Viviana Simpler I on: 10/10/2019 01:48 PM   Modules accepted: Orders

## 2019-10-20 ENCOUNTER — Other Ambulatory Visit: Payer: Self-pay | Admitting: Physician Assistant

## 2019-10-20 ENCOUNTER — Other Ambulatory Visit: Payer: Self-pay | Admitting: Internal Medicine

## 2019-10-24 ENCOUNTER — Telehealth: Payer: Self-pay | Admitting: Internal Medicine

## 2019-10-24 DIAGNOSIS — F321 Major depressive disorder, single episode, moderate: Secondary | ICD-10-CM

## 2019-10-24 NOTE — Telephone Encounter (Signed)
Pt called to let you know  She is almost at the end of duloxetine 60mg  for a month  and she can not tell that it is helping her  What does pt need to do  [facility] [address] [place]

## 2019-10-24 NOTE — Telephone Encounter (Signed)
Please advise 

## 2019-10-26 NOTE — Telephone Encounter (Signed)
It was my plan to increase to 90mg  daily at this point if no side effects. Please confirm with her if there have been any problems with this---if not, change Rx to duloxetine 30mg  --- 3 daily (#90 x 2)

## 2019-10-27 MED ORDER — DULOXETINE HCL 30 MG PO CPEP: 90.0000 mg | ORAL_CAPSULE | Freq: Every day | ORAL | 2 refills | Status: DC

## 2019-10-27 NOTE — Telephone Encounter (Signed)
Pt denies any side effects of the medication and reports she does not feel any change at all from the medication yet. Educated pt on side effects of med and if she had any to contact office. Pt did mention she may be sleeping a little better. Advised pt a new script would be sent in at 90 mg daily. Advised if any problems with new dose to contact office. Pt verbalized understanding.  Pharmacy verified with pt.   Sent in script for 90 mg daily

## 2019-10-28 ENCOUNTER — Other Ambulatory Visit: Payer: Self-pay | Admitting: *Deleted

## 2019-10-28 NOTE — Patient Outreach (Signed)
[place] [facility]) Care Management  10/28/2019  Tina Pacheco [birth year] [id number]  Unsuccessful outreach attempt made to patient. RN Health Coach left HIPAA compliant voicemail message along with her contact information.  Plan: RN Health Coach will call patient within the month of September.  Emelia Loron RN, BSN [name] [phone] [name]

## 2019-11-07 ENCOUNTER — Telehealth: Payer: Self-pay | Admitting: Internal Medicine

## 2019-11-07 MED ORDER — OXYCODONE HCL 20 MG PO TABS
1.0000 | ORAL_TABLET | Freq: Two times a day (BID) | ORAL | 0 refills | Status: DC
Start: 2019-11-07 — End: 2019-12-08

## 2019-11-07 MED ORDER — OXYCODONE HCL 10 MG PO TABS: 10.0000 mg | ORAL_TABLET | Freq: Two times a day (BID) | ORAL | 0 refills | Status: DC | PRN

## 2019-11-07 NOTE — Telephone Encounter (Signed)
Pt called to get refill on oxycodone [facility] [address] Pt will run out over weekend

## 2019-11-07 NOTE — Addendum Note (Signed)
Addended by: Viviana Simpler I on: 11/07/2019 01:20 PM   Modules accepted: Orders

## 2019-11-07 NOTE — Telephone Encounter (Signed)
20mg  was sent in because 10mg  was on backorder last month

## 2019-11-07 NOTE — Telephone Encounter (Signed)
Tina Pacheco with [facility] left message at Triage. Oxycodone 10 mg is still on back order. Judson Roch is requesting PCP send in the 20 mg Rx like he did last month

## 2019-11-07 NOTE — Telephone Encounter (Signed)
Name of Medication:oxycodone 10 mg Name of Pharmacy:[facility] [address] or Written Date and Quantity:10-10-19 #120 Last Office Visit and Type:08-27-19 Next Office Visit and Type:09-24-19 Last Controlled Substance Agreement Date:05/23/19 Last UDS:05/14/19

## 2019-11-26 ENCOUNTER — Other Ambulatory Visit: Payer: Self-pay | Admitting: *Deleted

## 2019-11-26 NOTE — Patient Outreach (Signed)
[facility]) Care Management  11/26/2019  VESTA WHEELAND 1951/02/05 [id number]  Successful telephone outreach call to patient. HIPAA identifiers obtained. Nurse spoke with patient for a few moments and introduced herself. Soon, the patient's sister came to visit and the patient was no longer able to speak. She did schedule a phone appointment with this nurse for tomorrow.    Plan: RN Health Coach will call patient tomorrow.  Emelia Loron RN, BSN [place] [phone] [email or web address]

## 2019-11-27 ENCOUNTER — Telehealth: Payer: Self-pay | Admitting: Internal Medicine

## 2019-11-27 ENCOUNTER — Other Ambulatory Visit: Payer: Self-pay | Admitting: *Deleted

## 2019-11-27 MED ORDER — FLUOXETINE HCL 20 MG PO CAPS: 20.0000 mg | ORAL_CAPSULE | Freq: Every day | ORAL | 3 refills | Status: DC

## 2019-11-27 NOTE — Telephone Encounter (Signed)
Let her know I sent a new prescription for her---generic of prozac. She should take it in the morning. Have her cut back the duloxetine to 60mg  daily for now. Set up appt in 3 weeks or so to review how she is doing (virtual is okay)

## 2019-11-27 NOTE — Telephone Encounter (Signed)
Pt called regarding duloxetine  Her stating it does not seem to be working for her.  She wanted to know if there is something else she can try  [facility]  [address] [place]

## 2019-11-28 ENCOUNTER — Encounter: Payer: Self-pay | Admitting: *Deleted

## 2019-11-28 NOTE — Telephone Encounter (Signed)
Spoke to pt. She has her routine 3 month follow-up on 10-27.

## 2019-11-28 NOTE — Patient Outreach (Signed)
[facility]) Care Management  [place]  11/27/19  CELSA NORDAHL 1950-04-27 [id number]  Subjective: Successful telephone outreach call to patient. HIPAA identifiers obtained. Patient states that she is doing fairly well. She denies having any falls since January and explains she is very careful when she ambulates with her rollator. Patient states her home environment is safe, she denies any needs for DME, and she has support from her husband [place]. Patient has a step safe walk in tub and states she is independent with her ADLs. Her husband helps and supports her with IADLs and transportation to her providers appointments.  Patient states that she has not been self-managing her heart failure at home. She explains that she is not motivated and continues to have chronic headaches and nausea most days. Also, she admits that she is just "not motivated to do what I know I need to do to take care of myself." Adding, that she has in the past made improvements with her health and then something will happen like a surgery and all that she has accomplished is lost and she will need to start over; "I'm just tired."  Patient stated that her PCP has prescribed Duloxetine and has increased the dose but thus far it has not made any difference. Patient denies any thoughts of not wanting to be here or hurting herself in anyway. Nurse discussed referring patient to a [facility] LCSW for counseling but the patient states that she was not interested at this time but let the nurse know if she changes her mind. Nurse did strongly encourage the patient to call her PCP and inform them that the prescribe antidepressant medication was not working for her. The patient verbalized understanding and stated that she would call.   After further discussion about managing her CHF, the patient did agree to start to weigh herself every M-W-F. She stated that she will decrease that amount of salty snacks she is eating,  and will work on starting a grocery list with her husband of food items that are healthier. Patient explained that she will leave notes by her medication to remind her to take her as needed Torsemide daily in the morning hours; she has not been taking it as routinely as she should due to frequent urination at night and some incontinence that has led to diaper rash. Patient also explained that she is trying to be more active by getting up throughout the day and accomplishing small tasks. She is also motivated to start doing some leg lifts several times daily while she is watching television. Patient has goals of improving her strength and ambulatory status to be able to get another dog. Patient reports that she lost her dog several months back and this has really impacted her emotionally. Her other goal is to gain enough strength to walk outside to her pool where she could enjoy the pool and get more exercise by walking.  Patient did not have any further questions or concerns today and she did confirm that she had this nurse's contact number to call her if needed.   Encounter Medications:  Outpatient Encounter Medications as of 11/27/2019  Medication Sig Note  . amLODipine (NORVASC) 5 MG tablet TAKE 1 TABLET BY MOUTH EVERY DAY IN THE EVENING   . atorvastatin (LIPITOR) 20 MG tablet TAKE 1 TABLET DAILY AT 6PM   . diphenhydrAMINE (BENADRYL) 25 MG tablet Take 25 mg by mouth daily as needed for allergies.    . DULoxetine (CYMBALTA)  30 MG capsule Take 3 capsules (90 mg total) by mouth daily.   . folic acid (FOLVITE) 1 MG tablet Take 1 mg by mouth daily.   Marland Kitchen gabapentin (NEURONTIN) 300 MG capsule Take 1 capsule 3 times a day (Take 1 capsule 2-3 hours before bedtime)   . hydroxychloroquine (PLAQUENIL) 200 MG tablet Take 200 mg by mouth 2 (two) times daily.    Marland Kitchen leflunomide (ARAVA) 20 MG tablet Take 20 mg by mouth every morning.    . meclizine (ANTIVERT) 25 MG tablet TAKE 1 TABLET(25 MG) BY MOUTH THREE TIMES  DAILY AS NEEDED FOR DIZZINESS   . metoprolol succinate (TOPROL-XL) 50 MG 24 hr tablet TAKE 1 TABLET BY MOUTH EVERY DAY WITH OR IMMEDIATELY FOLLOWING A MEAL (Patient taking differently: Take 50 mg by mouth at bedtime. )   . montelukast (SINGULAIR) 10 MG tablet TAKE 1 TABLET BY MOUTH AT BEDTIME   . Multiple Vitamin (MULTIVITAMIN) tablet Take 1 tablet by mouth daily.     . Oxycodone HCl 20 MG TABS Take 1 tablet (20 mg total) by mouth in the morning and at bedtime. Substitute for prev rx   . polyethylene glycol (MIRALAX / GLYCOLAX) packet Take 17 g by mouth daily as needed for mild constipation.   . potassium chloride SA (KLOR-CON) 20 MEQ tablet TAKE 1/2 TABLET BY MOUTH TWICE A DAY(MAY TAKE AN EXTRA TABLET WHEN TAKING TORSEMIDE)   . PRADAXA 150 MG CAPS capsule TAKE 1 CAPSULE TWICE DAILY   . SF 5000 PLUS 1.1 % CREA dental cream Place 1 application onto teeth 2 (two) times daily.   . sodium chloride (OCEAN) 0.65 % SOLN nasal spray Place 1 spray into both nostrils as needed for congestion.   Marland Kitchen terazosin (HYTRIN) 10 MG capsule Take 1 capsule (10 mg total) by mouth at bedtime.   . tolterodine (DETROL LA) 2 MG 24 hr capsule TAKE 1 CAPSULE(2 MG) BY MOUTH DAILY AS NEEDED FOR BLADDER SPASMS   . torsemide (DEMADEX) 20 MG tablet Take 20 mg by mouth twice daily as needed for swelling (Patient taking differently: Take 20 mg by mouth daily as needed. Take 20 mg by mouth twice daily as needed for swelling) 11/27/2019: Patient takes occasionally.   No facility-administered encounter medications on file as of 11/27/2019.    Functional Status:  In your present state of health, do you have any difficulty performing the following activities: 04/16/2019 03/30/2019  Hearing? N N  Vision? N N  Difficulty concentrating or making decisions? N N  Walking or climbing stairs? Y Y  Dressing or bathing? N Y  Doing errands, shopping? Y N  Comment Husband provides transportation Facilities manager and eating ? N -  Using the  Toilet? N -  In the past six months, have you accidently leaked urine? Y -  Comment related to diuretic use -  Do you have problems with loss of bowel control? N -  Managing your Medications? N -  Managing your Finances? N -  Comment Family assists as indicated -  Housekeeping or managing your Housekeeping? N -  Comment Family assists as indicated -  Some recent data might be hidden    Fall/Depression Screening: Fall Risk  11/27/2019 11/27/2019 07/07/2019  Falls in the past year? '1 1 1  ' Comment - - Denies falls post SNF discharge in February 2021  Number falls in past yr: '1 1 1  ' Injury with Fall? 0 0 0  Risk Factor Category  - - -  Risk for fall due to : Impaired mobility;Impaired balance/gait;History of fall(s) Impaired mobility;Impaired balance/gait;History of fall(s) History of fall(s);Impaired mobility;Medication side effect;Orthopedic patient;Other (Comment)  Risk for fall due to: Comment - - morbid obesity  Follow up Falls prevention discussed;Education provided;Falls evaluation completed Falls prevention discussed;Education provided;Falls evaluation completed Education provided;Falls prevention discussed   PHQ 2/9 Scores 11/27/2019 08/28/2019 07/07/2019 04/16/2019 03/20/2018 02/15/2017 11/16/2016  PHQ - 2 Score '3 1 1 ' 0 - 0 0  PHQ- 9 Score 11 - - - - 0 -  Exception Documentation - - - - Medical reason - -   Goals Addressed              This Visit's Progress     Patient Stated   .  COMPLETED: "I would like to be able to get outside some and go down to my pool" (pt-stated)        [place] (see longitudinal plan of care for additional care plan information)  Current Barriers:  . Chronic Disease Management support and education needs related to CHF management in setting of chronic pain and morbid obesity . Intermittent Non-adherence to prescribed medication regimen . Stated personal lack of motivation to follow plan of care  Nurse Case Manager Clinical Goal(s):  Marland Kitchen Over  the next 60 days, patient will demonstrate improved adherence to prescribed treatment plan for CHF as evidenced by patient reporting 09/29/19: on track to meeting goal  Interventions:  . Inter-disciplinary care team collaboration (see longitudinal plan of care) . Evaluation of current treatment plan related to CHF and patient's adherence to plan as established by provider. . Reviewed medications with patient and discussed recent provider office visit instructions for medications . Discussed plans with patient for ongoing care management follow up and provided patient with direct contact information for care management team . Reviewed scheduled/upcoming provider appointments including: PCP- September 24, 2019 . Advised patient, providing education and rationale, to weigh daily and record, calling cardiology provider for weight gain of 3lbs overnight or 5 pounds in a week.  09/29/19: -- reviewed previously provided education around signs/ symptoms and corresponding action plan for CHF yellow zone; patient continue to require reinforcement of same -- reviewed recent PCP office visit and post-office visit instructions with patient -- reviewed medications with patient and confirmed no medication concerns -- discussed role of [facility] RN health Coach and placed referral for same; encouraged patient to actively engage with [facility] coach -- updated SDOH   Patient Self Care Activities:  . Patient verbalizes understanding of plan to weigh daily and take medications . Self administers medications as prescribed- verbalizes agreement to resume taking diuretics as instructed . Attends all scheduled provider appointments . Calls provider office for new concerns or questions . Does not adhere to prescribed medication regimen; history of stated non-adherence to taking diuretics . Lack of motivation to follow plan of care for medications and diet  Initial goal documentation Updated 09/29/19/ LMT  Oneta Rack, RN, BSN, [place] [facility]  [phone]   Resolved due to duplicate goal        Other   .  COMPLETED: Diet Management        Starting 02/15/2017, I will continue to follow Weight Watchers program and to attend weekly meetings as scheduled.   Resolved due to patient can no longer attend meeting due to covid and the online meetings do not work for her.     .  Eat  Healthy        Follow Up Date 01/27/20    - limit fast food meals to no more than 1 per week - manage portion size - set a realistic goal    Why is this important?   When you are ready to manage your nutrition or weight, having a plan and setting goals will help.  Taking small steps to change how you eat and exercise is a good place to start.    Notes: Patient states she will makes healthier choices and attempt to reduce sodium intake and snacking on junk. Patient will work with husband to compile a grocery list of healthier food choices    .  Lifestyle Change        Follow Up Date 01/27/20   - agree on reward when goals are met - agree to work together to make changes - ask questions to understand    Why is this important?   The changes that you are asked to make may be hard to do.  This is especially true when the changes are life-long.  Knowing why it is important to you is the first step.  Working on the change with your family or support person helps you not feel alone.  Reward yourself and family or support person when goals are met. This can be an activity you choose like bowling, hiking, biking, swimming or shooting hoops.     Notes: Patient has goal of improving health so she can care for another dog. Patient also wants to be able to get into her swimming pool to enjoy and walk for exercise. Patient states she will get up more often and accomplish small task around the house. She will also start to do leg lifts to help her gain strength when she is watching  television.    .  Make and Keep All Appointments        Follow Up Date 01/27/20    - ask family or friend for a ride - call to cancel if needed - keep a calendar with prescription refill dates - keep a calendar with appointment dates    Why is this important?   Part of staying healthy is seeing the doctor for follow-up care.  If you forget your appointments, there are some things you can do to stay on track.    Notes: Patient states that she is able to keep up with her provider appointments and her husband is able to provide transportation    .  Manage My Medicine        Follow Up Date 01/27/20    - call for medicine refill 2 or 3 days before it runs out - call if I am sick and can't take my medicine - keep a list of all the medicines I take; vitamins and herbals too - learn to read medicine labels    Why is this important?   These steps will help you keep on track with your medicines.    Notes: Patient states that pillboxes do not work for her. Patient states she will leave a note to remind her to take her as needed Torsemide more rountinely    .  Track and Manage Fluids and Swelling        Follow Up Date 01/27/20    - call office if I gain more than 2 pounds in one day or 5 pounds in one week - keep legs up while sitting - track weight in diary -  use salt in moderation - watch for swelling in feet, ankles and legs every day    Why is this important?   It is important to check your weight daily and watch how much salt and liquids you have.  It will help you to manage your heart failure.    Notes: Patient reports that she has not been weighing herself due to lack of motivation. She states that she will begin to weigh herself every M-W-F adding she will commit to doing that much today.     .  Track and Manage Symptoms        Follow Up Date 01/27/20    - eat more whole grains, fruits and vegetables, lean meats and healthy fats - know when to call the doctor - track  symptoms and what helps feel better or worse    Why is this important?   You will be able to handle your symptoms better if you keep track of them.  Making some simple changes to your lifestyle will help.  Eating healthy is one thing you can do to take good care of yourself.    Notes: Patient states that she does call the doctor for an appointment when she is feeling symptomatic.       Plan: RN Health Coach will send PCP a barrier letter and today's assessment note, will send patient Advance Directive Documents, will call patient within the month of November, and patient agrees to future outreach calls.   Emelia Loron RN, BSN [place] [phone] [email or web address]

## 2019-12-05 NOTE — Telephone Encounter (Addendum)
Spoke to pt. Stopped on her med list

## 2019-12-05 NOTE — Telephone Encounter (Signed)
Pt called triage. She is taking 2 Duloxetine and 1 fluoxetine. She is having nightmares and doing weird things while sleeping (her family has said). She has a few fluoxetine left. When she is awake she thinks she may be better but she is focused on the weird things that she has been told she does. Asking what she needs to do. Just go ahead and stop the fluoxetine now.

## 2019-12-05 NOTE — Addendum Note (Signed)
Addended by: Pilar Grammes on: 12/05/2019 11:13 AM   Modules accepted: Orders

## 2019-12-05 NOTE — Telephone Encounter (Signed)
Please have her stop the duloxetine and continue the fluoxetine. I think it is the combination that is causing the problems. Have her let us know if the dreams don't stop off the duloxetine

## 2019-12-08 ENCOUNTER — Telehealth: Payer: Self-pay | Admitting: Internal Medicine

## 2019-12-08 MED ORDER — OXYCODONE HCL 20 MG PO TABS
1.0000 | ORAL_TABLET | Freq: Two times a day (BID) | ORAL | 0 refills | Status: DC
Start: 2019-12-08 — End: 2020-01-06

## 2019-12-08 NOTE — Telephone Encounter (Signed)
Pt is checking on status of her oxycodone refill.  She says she ran out yesterday.  Please advise. Thank you!

## 2019-12-08 NOTE — Telephone Encounter (Signed)
Name of Medication:oxycodone 10 mg Name of Pharmacy:[facility] [address] [place] or Written Date and Quantity:11-07-19 #120 Last Office Visit and Type:08-27-19 Next Office Visit and Type:09-24-19 Last Controlled Substance Agreement Date:05/23/19 Last UDS:05/14/19

## 2019-12-08 NOTE — Telephone Encounter (Signed)
Patient is requesting a refill on her oxycodone to be sent to [facility] on [address].

## 2019-12-09 ENCOUNTER — Emergency Department: Payer: Medicare Other

## 2019-12-09 ENCOUNTER — Other Ambulatory Visit: Payer: Self-pay

## 2019-12-09 ENCOUNTER — Emergency Department
Admission: EM | Admit: 2019-12-09 | Discharge: 2019-12-10 | Disposition: A | Discharge status: Home / Self Care | Payer: Medicare Other | Attending: Emergency Medicine | Admitting: Emergency Medicine

## 2019-12-09 DIAGNOSIS — J45909 Unspecified asthma, uncomplicated: Secondary | ICD-10-CM | POA: Insufficient documentation

## 2019-12-09 DIAGNOSIS — R52 Pain, unspecified: Secondary | ICD-10-CM | POA: Diagnosis not present

## 2019-12-09 DIAGNOSIS — J449 Chronic obstructive pulmonary disease, unspecified: Secondary | ICD-10-CM | POA: Diagnosis not present

## 2019-12-09 DIAGNOSIS — Z85828 Personal history of other malignant neoplasm of skin: Secondary | ICD-10-CM | POA: Diagnosis not present

## 2019-12-09 DIAGNOSIS — Z20822 Contact with and (suspected) exposure to covid-19: Secondary | ICD-10-CM | POA: Insufficient documentation

## 2019-12-09 DIAGNOSIS — R519 Headache, unspecified: Secondary | ICD-10-CM | POA: Diagnosis not present

## 2019-12-09 DIAGNOSIS — R0602 Shortness of breath: Secondary | ICD-10-CM | POA: Diagnosis not present

## 2019-12-09 DIAGNOSIS — R5383 Other fatigue: Secondary | ICD-10-CM | POA: Diagnosis not present

## 2019-12-09 DIAGNOSIS — I10 Essential (primary) hypertension: Secondary | ICD-10-CM | POA: Diagnosis not present

## 2019-12-09 DIAGNOSIS — Z7951 Long term (current) use of inhaled steroids: Secondary | ICD-10-CM | POA: Insufficient documentation

## 2019-12-09 DIAGNOSIS — Z79899 Other long term (current) drug therapy: Secondary | ICD-10-CM | POA: Diagnosis not present

## 2019-12-09 DIAGNOSIS — I5032 Chronic diastolic (congestive) heart failure: Secondary | ICD-10-CM | POA: Diagnosis not present

## 2019-12-09 DIAGNOSIS — I110 Hypertensive heart disease with heart failure: Secondary | ICD-10-CM | POA: Insufficient documentation

## 2019-12-09 DIAGNOSIS — R42 Dizziness and giddiness: Secondary | ICD-10-CM | POA: Diagnosis not present

## 2019-12-09 DIAGNOSIS — G4489 Other headache syndrome: Secondary | ICD-10-CM | POA: Diagnosis not present

## 2019-12-09 LAB — BLOOD GAS, VENOUS
Acid-Base Excess: 2.2 mmol/L — ABNORMAL HIGH (ref 0.0–2.0)
Bicarbonate: 27.3 mmol/L (ref 20.0–28.0)
O2 Saturation: 99.2 %
Patient temperature: 37
pCO2, Ven: 43 mmHg — ABNORMAL LOW (ref 44.0–60.0)
pH, Ven: 7.41 (ref 7.250–7.430)
pO2, Ven: 144 mmHg — ABNORMAL HIGH (ref 32.0–45.0)

## 2019-12-09 LAB — COMPREHENSIVE METABOLIC PANEL
ALT: 22 U/L (ref 0–44)
AST: 34 U/L (ref 15–41)
Albumin: 3.4 g/dL — ABNORMAL LOW (ref 3.5–5.0)
Alkaline Phosphatase: 88 U/L (ref 38–126)
Anion gap: 10 (ref 5–15)
BUN: 14 mg/dL (ref 8–23)
CO2: 24 mmol/L (ref 22–32)
Calcium: 10.1 mg/dL (ref 8.9–10.3)
Chloride: 102 mmol/L (ref 98–111)
Creatinine, Ser: 0.52 mg/dL (ref 0.44–1.00)
GFR, Estimated: >60 mL/min (ref >60)
Glucose, Bld: 113 mg/dL — ABNORMAL HIGH (ref 70–99)
Potassium: 4.6 mmol/L (ref 3.5–5.1)
Sodium: 136 mmol/L (ref 135–145)
Total Bilirubin: 1.1 mg/dL (ref 0.3–1.2)
Total Protein: 6.7 g/dL (ref 6.5–8.1)

## 2019-12-09 LAB — CBC WITH DIFFERENTIAL/PLATELET
Abs Immature Granulocytes: 0.03 10*3/uL (ref 0.00–0.07)
Basophils Absolute: 0.1 10*3/uL (ref 0.0–0.1)
Basophils Relative: 1 %
Eosinophils Absolute: 0.6 10*3/uL — ABNORMAL HIGH (ref 0.0–0.5)
Eosinophils Relative: 7 %
HCT: 38.7 % (ref 36.0–46.0)
Hemoglobin: 12.2 g/dL (ref 12.0–15.0)
Immature Granulocytes: 0 %
Lymphocytes Relative: 16 %
Lymphs Abs: 1.3 10*3/uL (ref 0.7–4.0)
MCH: 27.7 pg (ref 26.0–34.0)
MCHC: 31.5 g/dL (ref 30.0–36.0)
MCV: 88 fL (ref 80.0–100.0)
Monocytes Absolute: 0.7 10*3/uL (ref 0.1–1.0)
Monocytes Relative: 8 %
Neutro Abs: 5.7 10*3/uL (ref 1.7–7.7)
Neutrophils Relative %: 68 %
Platelets: 277 10*3/uL (ref 150–400)
RBC: 4.4 MIL/uL (ref 3.87–5.11)
RDW: 15.7 % — ABNORMAL HIGH (ref 11.5–15.5)
WBC: 8.4 10*3/uL (ref 4.0–10.5)
nRBC: 0 % (ref 0.0–0.2)

## 2019-12-09 LAB — TROPONIN I (HIGH SENSITIVITY): Troponin I (High Sensitivity): 5 ng/L (ref <18)

## 2019-12-09 MED ORDER — DIPHENHYDRAMINE HCL 50 MG/ML IJ SOLN
12.5000 mg | Freq: Once | INTRAMUSCULAR | Status: AC
  Administered 2019-12-10: 12.5 mg via INTRAVENOUS
  Filled 2019-12-09: qty 1

## 2019-12-09 MED ORDER — PROCHLORPERAZINE EDISYLATE 10 MG/2ML IJ SOLN
5.0000 mg | Freq: Once | INTRAMUSCULAR | Status: AC
  Administered 2019-12-10: 5 mg via INTRAVENOUS
  Filled 2019-12-09: qty 2

## 2019-12-09 MED ORDER — IPRATROPIUM-ALBUTEROL 0.5-2.5 (3) MG/3ML IN SOLN
3.0000 mL | Freq: Once | RESPIRATORY_TRACT | Status: AC
  Administered 2019-12-09: 3 mL via RESPIRATORY_TRACT
  Filled 2019-12-09: qty 3

## 2019-12-09 NOTE — Telephone Encounter (Signed)
I sent it yesterday and it is in the record as having been sent. If the pharmacy doesn't have it, please have someone there redo it for me

## 2019-12-09 NOTE — ED Notes (Signed)
Pt to CT

## 2019-12-09 NOTE — ED Triage Notes (Signed)
PT to ED from home via [place] with c/o episode of dizziness and near syncope today where she leaned forward in her chair and got dizzy and fell. Did not hit head, no LOC. PT has had headache with intermittent dizziness and nausea for 4 days. Pt alert and oriented on arrival.

## 2019-12-09 NOTE — ED Provider Notes (Signed)
[facility] Emergency Department Provider Note  ____________________________________________   First MD Initiated Contact with Patient 12/09/19 2330     (approximate)  I have reviewed the triage vital signs and the nursing notes.   HISTORY  Chief Complaint Dizziness and Headache    HPI Tina Pacheco is a 69 y.o. female with past medical history of CHF, RA, COPD, headaches, morbid obesity, here with headache for the last week.  The patient states that for the last week, she has been getting progressively worse generalized headaches.  She describes them as somewhat similar to her previous migraines, although she has not had migraines in multiple years.  The pain seems somewhat worse with bright lights and loud noises.  No neck pain or neck stiffness.  No fevers or chills.  She states that she has also felt somewhat more fatigued, though she is having difficulty sleeping due to the aching headaches.  She is feels like she has not slept in the last 3 to 4 days.  No known sick contacts.  No Covid exposures.  No other medical complaints.        Past Medical History:  Diagnosis Date  . Acute CHF (congestive heart failure) ([place]) 03/30/2019  . Arthritis    RA  . Asthma   . Basal cell carcinoma   . Cataract 2018   bilateral eyes; corrected with surgery  . Claustrophobia   . Collagen vascular disease (HCC)    RA  . COPD (chronic obstructive pulmonary disease) ([place])   . Diastolic dysfunction    a. echo 07/2014: EF 55-60%, no RWMA, GR2DD, mild MR, LA moderately dilated, PASP 38 mm Hg  . Dyslipidemia   . Dysrhythmia   . Headache    migraines  . Hemihypertrophy   . History of cardiac cath    a. 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 History of gout   . Hyperplastic colonic polyp 2003  . Hypertension   . Hypokalemia   . Morbid obesity ([place])   . PAF (paroxysmal atrial fibrillation) (HCC)    a. on Pradaxa; b. CHADSVASc at least 2 (HTN & female)  .  Rheumatoid arthritis(714.0)   . Sleep apnea    a. not compliant with CPAP    Patient Active Problem List   Diagnosis Date Noted  . Chronic diastolic heart failure ([place])   . Pressure injury of skin 03/31/2019  . COPD (chronic obstructive pulmonary disease) ([place]) 03/30/2019  . Urge incontinence of urine 12/23/2018  . Left leg pain 09/18/2018  . 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 Preventative health care 03/20/2018  . MDD (major depressive disorder), single episode, moderate ([place]) 03/20/2018  . Advance directive discussed with patient 03/20/2018  . Ongoing leg pain 12/16/2017  . Lymphedema 11/26/2017  . Narcotic dependence ([place]) 03/09/2017  . Vertigo 06/28/2016  . Bronchiectasis without acute exacerbation ([place]) 06/15/2015  . DDD (degenerative disc disease), lumbosacral 03/30/2015  . Sleep apnea   . Atherosclerotic heart disease of native coronary artery with angina pectoris ([place]) 08/25/2014  . PAF (paroxysmal atrial fibrillation) ([place]) 08/25/2014  . 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 RLS (restless legs syndrome) 07/06/2014  . Gout 05/26/2014  . Chronic pain syndrome 05/26/2014  . Migraine 04/08/2014  . Acquired deformity of arm 01/05/2014  . Back pain 07/22/2013  . Metatarsalgia of right foot 07/22/2013  . Insomnia 07/15/2013  . OA (osteoarthritis) 11/20/2012  . DJD (degenerative joint disease) of cervical spine 09/10/2012  .  Trapezius muscle spasm 07/21/2011  . Allergic rhinitis 07/10/2011  . Rheumatoid arthritis ([place]) 03/06/2011  . Morbid obesity ([place]) 06/22/2010  . HLD (hyperlipidemia) 06/22/2010  . HTN (hypertension) 06/22/2010    Past Surgical History:  Procedure Laterality Date  . ABDOMINAL HYSTERECTOMY    . APPLICATION OF WOUND VAC Right 08/09/2017   Procedure: APPLICATION OF WOUND VAC;  Surgeon: Albertine Patricia, DPM;  Location: [facility] ORS;  Service: Podiatry;  Laterality: Right;  . CARDIAC CATHETERIZATION  05/24/2010    nonobstructive CAD  . CATARACT EXTRACTION W/ INTRAOCULAR LENS IMPLANT Right 06/12/2016   Dr. Darleen Crocker  . CATARACT EXTRACTION W/ INTRAOCULAR LENS IMPLANT Left 06/26/2016   Dr. Darleen Crocker  . CESAREAN SECTION    . CHOLECYSTECTOMY    . COLONOSCOPY  06/12/2011   Procedure: COLONOSCOPY;  Surgeon: Juanita Craver, MD;  Location: [facility] END[facility]COPY;  Service: Endoscopy;  Laterality: N/A;  . COLONOSCOPY N/A 03/17/2013   Procedure: COLONOSCOPY;  Surgeon: Juanita Craver, MD;  Location: [facility] ENDOSCOPY;  Service: Endoscopy;  Laterality: N/A;  . EYE SURGERY    . FOOT ARTHRODESIS Right 07/13/2017   Procedure: ARTHRODESIS FOOT-MULTI.FUSIONS (6 JOINTS);  Surgeon: Albertine Patricia, DPM;  Location: [facility] ORS;  Service: Podiatry;  Laterality: Right;  . IRRIGATION AND DEBRIDEMENT FOOT Right 08/09/2017   Procedure: IRRIGATION AND DEBRIDEMENT FOOT;  Surgeon: Albertine Patricia, DPM;  Location: [facility] ORS;  Service: Podiatry;  Laterality: Right;  . KNEE ARTHROSCOPY     bilateral  . SINUSOTOMY    . TOOTH EXTRACTION  12/2016  . VAGINAL HYSTERECTOMY      Prior to Admission medications   Medication Sig Start Date End Date Taking? Authorizing Provider  amLODipine (NORVASC) 5 MG tablet TAKE 1 TABLET BY MOUTH EVERY DAY IN THE EVENING 09/04/19   Viviana Simpler I, MD  atorvastatin (LIPITOR) 20 MG tablet TAKE 1 TABLET DAILY AT 6PM 12/03/18   Venia Carbon, MD  diphenhydrAMINE (BENADRYL) 25 MG tablet Take 25 mg by mouth daily as needed for allergies.     [provider]  FLUoxetine (PROZAC) 20 MG capsule Take 1 capsule (20 mg total) by mouth daily. 11/27/19   Venia Carbon, MD  folic acid (FOLVITE) 1 MG tablet Take 1 mg by mouth daily.    [provider]  gabapentin (NEURONTIN) 300 MG capsule Take 1 capsule 3 times a day (Take 1 capsule 2-3 hours before bedtime) 01/15/19   Venia Carbon, MD  hydroxychloroquine (PLAQUENIL) 200 MG tablet Take 200 mg by mouth 2 (two) times daily.     [provider]    leflunomide (ARAVA) 20 MG tablet Take 20 mg by mouth every morning.  09/20/11   [provider]  meclizine (ANTIVERT) 25 MG tablet TAKE 1 TABLET(25 MG) BY MOUTH THREE TIMES DAILY AS NEEDED FOR DIZZINESS 10/20/19   Venia Carbon, MD  metoprolol succinate (TOPROL-XL) 50 MG 24 hr tablet TAKE 1 TABLET BY MOUTH EVERY DAY WITH OR IMMEDIATELY FOLLOWING A MEAL Patient taking differently: Take 50 mg by mouth at bedtime.  01/16/19   Venia Carbon, MD  montelukast (SINGULAIR) 10 MG tablet TAKE 1 TABLET BY MOUTH AT BEDTIME 02/07/19   Venia Carbon, MD  Multiple Vitamin (MULTIVITAMIN) tablet Take 1 tablet by mouth daily.      [provider]  Oxycodone HCl 20 MG TABS Take 1 tablet (20 mg total) by mouth in the morning and at bedtime. Substitute for prev rx 12/08/19   Venia Carbon, MD  polyethylene  glycol (MIRALAX / GLYCOLAX) packet Take 17 g by mouth daily as needed for mild constipation. 07/16/17   Henreitta Leber, MD  potassium chloride SA (KLOR-CON) 20 MEQ tablet TAKE 1/2 TABLET BY MOUTH TWICE A DAY(MAY TAKE AN EXTRA TABLET WHEN TAKING TORSEMIDE) 06/09/19   Walker, Martie Lee, NP  PRADAXA 150 MG CAPS capsule TAKE 1 CAPSULE TWICE DAILY 10/21/19   Gollan, Kathlene November, MD  SF 5000 PLUS 1.1 % CREA dental cream Place 1 application onto teeth 2 (two) times daily. 06/01/17   [provider]  sodium chloride (OCEAN) 0.65 % SOLN nasal spray Place 1 spray into both nostrils as needed for congestion.    [provider]  terazosin (HYTRIN) 10 MG capsule Take 1 capsule (10 mg total) by mouth at bedtime. 04/08/19   Venia Carbon, MD  tolterodine (DETROL LA) 2 MG 24 hr capsule TAKE 1 CAPSULE(2 MG) BY MOUTH DAILY AS NEEDED FOR BLADDER SPASMS 05/30/19   Venia Carbon, MD  torsemide (DEMADEX) 20 MG tablet Take 20 mg by mouth twice daily as needed for swelling Patient taking differently: Take 20 mg by mouth daily as needed. Take 20 mg by mouth twice daily as needed for swelling  05/14/19   Venia Carbon, MD    Allergies Codeine  Family History  Problem Relation Age of Onset  . Emphysema Father        smoked  . Tuberculosis Mother   . Parkinsonism Mother   . Diabetes type II Sister   . Breast cancer Sister   . Breast cancer Maternal Aunt   . Tuberculosis Sister     Social History Social History   Tobacco Use  . Smoking status: Never Smoker  . Smokeless tobacco: Never Used  Vaping Use  . Vaping Use: Never used  Substance Use Topics  . Alcohol use: No  . Drug use: No    Review of Systems  Review of Systems  Constitutional: Positive for fatigue. Negative for fever.  HENT: Negative for congestion and sore throat.   Eyes: Negative for visual disturbance.  Respiratory: Negative for cough and shortness of breath.   Cardiovascular: Negative for chest pain.  Gastrointestinal: Negative for abdominal pain, diarrhea, nausea and vomiting.  Genitourinary: Negative for flank pain.  Musculoskeletal: Negative for back pain and neck pain.  Skin: Negative for rash and wound.  Neurological: Positive for headaches. Negative for weakness.  All other systems reviewed and are negative.    ____________________________________________  PHYSICAL EXAM:      VITAL SIGNS: ED Triage Vitals  Enc Vitals Group     BP 12/09/19 2134 (!) 185/87     Pulse Rate 12/09/19 2134 75     Resp 12/09/19 2134 13     Temp 12/09/19 2135 97.7 F (36.5 C)     Temp Source 12/09/19 2135 Oral     SpO2 12/09/19 2133 100 %     Weight 12/09/19 2134 (!) 339 lb 15.2 oz (154.2 kg)     Height 12/09/19 2134 5\' 3"  (1.6 m)     Head Circumference --      Peak Flow --      Pain Score 12/09/19 2134 9     Pain Loc --      Pain Edu? --      Excl. in [place]? --      Physical Exam    ____________________________________________   LABS (all labs ordered are listed, but only abnormal results are displayed)  Labs Reviewed  CBC WITH DIFFERENTIAL/PLATELET - Abnormal; Notable for the  following components:      Result Value   RDW 15.7 (*)    Eosinophils Absolute 0.6 (*)    All other components within normal limits  COMPREHENSIVE METABOLIC PANEL - Abnormal; Notable for the following components:   Glucose, Bld 113 (*)    Albumin 3.4 (*)    All other components within normal limits  URINALYSIS, COMPLETE (UACMP) WITH MICROSCOPIC - Abnormal; Notable for the following components:   Color, Urine YELLOW (*)    APPearance CLEAR (*)    Protein, ur 30 (*)    All other components within normal limits  BLOOD GAS, VENOUS - Abnormal; Notable for the following components:   pCO2, Ven 43 (*)    pO2, Ven 144.0 (*)    Acid-Base Excess 2.2 (*)    All other components within normal limits  BRAIN NATRIURETIC PEPTIDE - Abnormal; Notable for the following components:   B Natriuretic Peptide 171.4 (*)    All other components within normal limits  RESP PANEL BY RT PCR (RSV, FLU A&B, COVID)  TROPONIN I (HIGH SENSITIVITY)  TROPONIN I (HIGH SENSITIVITY)    ____________________________________________  EKG:  ________________________________________  RADIOLOGY All imaging, including plain films, CT scans, and ultrasounds, independently reviewed by me, and interpretations confirmed via formal radiology reads.  ED MD interpretation:   CT Head: [place]  Official radiology report(s): CT Head Wo Contrast  Result Date: 12/09/2019 CLINICAL DATA:  68 year old female with headache. EXAM: CT HEAD WITHOUT CONTRAST TECHNIQUE: Contiguous axial images were obtained from the base of the skull through the vertex without intravenous contrast. COMPARISON:  Head CT dated 04/04/2014. FINDINGS: Brain: There is mild age-related atrophy and moderate chronic microvascular ischemic changes. There is no acute intracranial hemorrhage. No mass effect or midline shift. No extra-axial fluid collection. Vascular: No hyperdense vessel or unexpected calcification. Skull: Normal. Negative for fracture or focal lesion.  Sinuses/Orbits: There is mild mucoperiosteal thickening of paranasal sinuses with partial opacification of the left sphenoid sinus. No air-fluid level. The mastoid air cells are clear. Other: None IMPRESSION: 1. No acute intracranial pathology. 2. Age-related atrophy and chronic microvascular ischemic changes. Electronically Signed   By: Anner Crete M.D.   On: 12/09/2019 22:39    ____________________________________________  PROCEDURES   Procedure(s) performed (including Critical Care):  Procedures  ____________________________________________  INITIAL IMPRESSION / MDM / [place] / ED COURSE  As part of my medical decision making, I reviewed the following data within the [facility] notes reviewed and incorporated, Old chart reviewed, Notes from prior ED visits, and [place] Controlled Substance Database       *LILLIEANNA TUOHY was evaluated in Emergency Department on 12/10/2019 for the symptoms described in the history of present illness. She was evaluated in the context of the global COVID-19 pandemic, which necessitated consideration that the patient might be at risk for infection with the SARS-CoV-2 virus that causes COVID-19. Institutional protocols and algorithms that pertain to the evaluation of patients at risk for COVID-19 are in a state of rapid change based on information released by regulatory bodies including the CDC and federal and state organizations. These policies and algorithms were followed during the patient's care in the ED.  Some ED evaluations and interventions may be delayed as a result of limited staffing during the pandemic.*     Medical Decision Making: 69 year old female here with generalized headache.  On exam, the patient is overall well-appearing.  She  has no focal neurological deficits.  She has moderate hypertension here, but I suspect this is related to the pain from her headaches.  Patient does carry diagnosis of migraines,  so primary consideration is primary headache syndrome with secondary hypertension.  CT head reviewed by me, shows no acute abnormality.  As mentioned, no focal neuro deficits or signs of stroke.  Her screening lab work thus far is unremarkable with no leukocytosis.  She has no fever, neck stiffness, or signs to suggest meningitis or encephalitis.  Of note, she does have history of COPD and describes some mild increase in wheezing.  Will check a VBG for hypercapnia.  Will treat her migraine.  Plan to reassess after labs, including urinalysis given possible UTI symptoms.  Suspect she can be managed as an outpatient if symptoms are improved.  ____________________________________________  FINAL CLINICAL IMPRESSION(S) / ED DIAGNOSES  Final diagnoses:  Acute nonintractable headache, unspecified headache type     MEDICATIONS GIVEN DURING THIS VISIT:  Medications  ipratropium-albuterol (DUONEB) 0.5-2.5 (3) MG/3ML nebulizer solution 3 mL (3 mLs Nebulization Given 12/09/19 2251)  prochlorperazine (COMPAZINE) injection 5 mg (5 mg Intravenous Given 12/10/19 0135)  diphenhydrAMINE (BENADRYL) injection 12.5 mg (12.5 mg Intravenous Given 12/10/19 0131)     ED Discharge Orders    None       Note:  This document was prepared using Dragon voice recognition software and may include unintentional dictation errors.   Duffy Bruce, MD 12/10/19 0200

## 2019-12-10 ENCOUNTER — Telehealth: Payer: Self-pay

## 2019-12-10 DIAGNOSIS — R519 Headache, unspecified: Secondary | ICD-10-CM | POA: Diagnosis not present

## 2019-12-10 LAB — URINALYSIS, COMPLETE (UACMP) WITH MICROSCOPIC
Bacteria, UA: NONE SEEN
Bilirubin Urine: NEGATIVE
Glucose, UA: NEGATIVE mg/dL
Hgb urine dipstick: NEGATIVE
Ketones, ur: NEGATIVE mg/dL
Leukocytes,Ua: NEGATIVE
Nitrite: NEGATIVE
Protein, ur: 30 mg/dL — AB
Specific Gravity, Urine: 1.009 (ref 1.005–1.030)
pH: 7 (ref 5.0–8.0)

## 2019-12-10 LAB — TROPONIN I (HIGH SENSITIVITY): Troponin I (High Sensitivity): 5 ng/L (ref <18)

## 2019-12-10 LAB — RESP PANEL BY RT PCR (RSV, FLU A&B, COVID)
Influenza A by PCR: NEGATIVE
Influenza B by PCR: NEGATIVE
Respiratory Syncytial Virus by PCR: NEGATIVE
SARS Coronavirus 2 by RT PCR: NEGATIVE

## 2019-12-10 LAB — BRAIN NATRIURETIC PEPTIDE: B Natriuretic Peptide: 171.4 pg/mL — ABNORMAL HIGH (ref 0.0–100.0)

## 2019-12-10 MED ORDER — ACETAMINOPHEN 500 MG PO TABS
1000.0000 mg | ORAL_TABLET | Freq: Once | ORAL | Status: AC
  Administered 2019-12-10: 1000 mg via ORAL
  Filled 2019-12-10: qty 2

## 2019-12-10 MED ORDER — METOCLOPRAMIDE HCL 5 MG/ML IJ SOLN
10.0000 mg | Freq: Once | INTRAMUSCULAR | Status: AC
  Administered 2019-12-10: 10 mg via INTRAVENOUS
  Filled 2019-12-10: qty 2

## 2019-12-10 NOTE — ED Provider Notes (Signed)
Assumed care of this patient from Dr. Ellender Hose at 11:30 PM.  Patient is here with a migraine.  Negative CT.  Normal exam.  I was asked to follow-up the results of the respiratory viral panel, urinalysis, and VBG since patient also has some wheezing.  I was also asked to reassess her clinical status after medications given for her migraine.  _________________________ 4:41 AM on 12/10/2019 -----------------------------------------  Headache has resolved.  Patient looks markedly improved.  Respiratory panel, UA, VBG with no acute findings.  Patient tells me that she has been having difficulty sleeping for the last month and daily headaches as well.  Her headaches are most likely due to insomnia.  She was on Ambien several years ago but her insurance stopped covering for it.  Recommended her to call her primary care doctor in the morning for a prescription to help her sleep.  Discussed maybe follow-up with neurology if the headaches don't improve after she is able to sleep.  Otherwise she remains extremely well-appearing with no neurological deficits, no neck stiffness, no fever.  Discussed return precautions with patient and her husband is at bedside.   Rudene Re, MD 12/10/19 [phone]

## 2019-12-10 NOTE — Telephone Encounter (Signed)
Spoke to pt. She got her rx.

## 2019-12-10 NOTE — Telephone Encounter (Signed)
Has been having headaches, nausea, dizziness. She is feeling better today. Has appt with Dr Silvio Pate 12-19-19.

## 2019-12-19 ENCOUNTER — Encounter: Payer: Self-pay | Admitting: Internal Medicine

## 2019-12-19 ENCOUNTER — Other Ambulatory Visit: Payer: Self-pay

## 2019-12-19 ENCOUNTER — Ambulatory Visit (INDEPENDENT_AMBULATORY_CARE_PROVIDER_SITE_OTHER): Payer: Medicare Other | Admitting: Internal Medicine

## 2019-12-19 VITALS — BP 114/62 | HR 71 | Temp 97.8°F | Ht 63.0 in | Wt 364.0 lb

## 2019-12-19 DIAGNOSIS — I480 Paroxysmal atrial fibrillation: Secondary | ICD-10-CM

## 2019-12-19 DIAGNOSIS — F321 Major depressive disorder, single episode, moderate: Secondary | ICD-10-CM

## 2019-12-19 DIAGNOSIS — G894 Chronic pain syndrome: Secondary | ICD-10-CM | POA: Diagnosis not present

## 2019-12-19 DIAGNOSIS — I2510 Atherosclerotic heart disease of native coronary artery without angina pectoris: Secondary | ICD-10-CM

## 2019-12-19 DIAGNOSIS — I5032 Chronic diastolic (congestive) heart failure: Secondary | ICD-10-CM | POA: Diagnosis not present

## 2019-12-19 DIAGNOSIS — Z23 Encounter for immunization: Secondary | ICD-10-CM | POA: Diagnosis not present

## 2019-12-19 NOTE — Assessment & Plan Note (Signed)
Doesn't seem as depressed Didn't do well with duloxetine and has had adverse response to fluoxetine Will stop meds for now Recheck in 2-3 weeks Consider alternatives (?tricyclic at night?)

## 2019-12-19 NOTE — Progress Notes (Signed)
Subjective:    Patient ID: Tina Pacheco, female    DOB: [birth year], 69 y.o.   MRN: [id number]  HPI Here for ER follow up This visit occurred during the SARS-CoV-2 public health emergency.  Safety protocols were in place, including screening questions prior to the visit, additional usage of staff PPE, and extensive cleaning of exam room while observing appropriate contact time as indicated for disinfecting solutions.   Reviewed ER records Had headache---?migraine CT scan benign and labs  Not sleeping well Awakens right after falling asleep Getting times mixed up "Always feels slightly dizzy" Feels shaky---on the inside and sometimes on the outside  [place] feelings started when taking the duloxetine with the fluoxetine Never had side effects with the duloxetine--even at 90mg   Current Outpatient Medications on File Prior to Visit  Medication Sig Dispense Refill  . amLODipine (NORVASC) 5 MG tablet TAKE 1 TABLET BY MOUTH EVERY DAY IN THE EVENING 90 tablet 3  . atorvastatin (LIPITOR) 20 MG tablet TAKE 1 TABLET DAILY AT 6PM 90 tablet 3  . diphenhydrAMINE (BENADRYL) 25 MG tablet Take 25 mg by mouth daily as needed for allergies.     Marland Kitchen FLUoxetine (PROZAC) 20 MG capsule Take 1 capsule (20 mg total) by mouth daily. 30 capsule 3  . folic acid (FOLVITE) 1 MG tablet Take 1 mg by mouth daily.    Marland Kitchen gabapentin (NEURONTIN) 300 MG capsule Take 1 capsule 3 times a day (Take 1 capsule 2-3 hours before bedtime) 90 capsule 11  . hydroxychloroquine (PLAQUENIL) 200 MG tablet Take 200 mg by mouth 2 (two) times daily.     Marland Kitchen leflunomide (ARAVA) 20 MG tablet Take 20 mg by mouth every morning.     . meclizine (ANTIVERT) 25 MG tablet TAKE 1 TABLET(25 MG) BY MOUTH THREE TIMES DAILY AS NEEDED FOR DIZZINESS 90 tablet 5  . metoprolol succinate (TOPROL-XL) 50 MG 24 hr tablet TAKE 1 TABLET BY MOUTH EVERY DAY WITH OR IMMEDIATELY FOLLOWING A MEAL (Patient taking differently: Take 50 mg by mouth at bedtime. ) 90 tablet  3  . montelukast (SINGULAIR) 10 MG tablet TAKE 1 TABLET BY MOUTH AT BEDTIME 90 tablet 3  . Multiple Vitamin (MULTIVITAMIN) tablet Take 1 tablet by mouth daily.      . Oxycodone HCl 20 MG TABS Take 1 tablet (20 mg total) by mouth in the morning and at bedtime. Substitute for prev rx 60 tablet 0  . polyethylene glycol (MIRALAX / GLYCOLAX) packet Take 17 g by mouth daily as needed for mild constipation. 14 each 0  . potassium chloride SA (KLOR-CON) 20 MEQ tablet TAKE 1/2 TABLET BY MOUTH TWICE A DAY(MAY TAKE AN EXTRA TABLET WHEN TAKING TORSEMIDE) 180 tablet 3  . PRADAXA 150 MG CAPS capsule TAKE 1 CAPSULE TWICE DAILY 180 capsule 0  . SF 5000 PLUS 1.1 % CREA dental cream Place 1 application onto teeth 2 (two) times daily.  1  . sodium chloride (OCEAN) 0.65 % SOLN nasal spray Place 1 spray into both nostrils as needed for congestion.    Marland Kitchen terazosin (HYTRIN) 10 MG capsule Take 1 capsule (10 mg total) by mouth at bedtime. 90 capsule 3  . tolterodine (DETROL LA) 2 MG 24 hr capsule TAKE 1 CAPSULE(2 MG) BY MOUTH DAILY AS NEEDED FOR BLADDER SPASMS 90 capsule 3  . torsemide (DEMADEX) 20 MG tablet Take 20 mg by mouth twice daily as needed for swelling (Patient taking differently: Take 20 mg by mouth daily as needed. Take 20  mg by mouth twice daily as needed for swelling) 180 tablet 3   No current facility-administered medications on file prior to visit.    Allergies  Allergen Reactions  . Codeine     Nausea and vomiting    Past Medical History:  Diagnosis Date  . Acute CHF (congestive heart failure) ([place]) 03/30/2019  . Arthritis    RA  . Asthma   . Basal cell carcinoma   . Cataract 2018   bilateral eyes; corrected with surgery  . Claustrophobia   . Collagen vascular disease (H[facility])    RA  . COPD (chronic obstructive pulmonary disease) ([place])   . Diastolic dysfunction    a. echo 07/2014: EF 55-60%, no RWMA, GR2DD, mild MR, LA moderately dilated, PASP 38 mm Hg  . Dyslipidemia   . Dysrhythmia   .  Headache    migraines  . Hemihypertrophy   . History of cardiac cath    a. 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 History of gout   . Hyperplastic colonic polyp 2003  . Hypertension   . Hypokalemia   . Morbid obesity ([place])   . PAF (paroxysmal atrial fibrillation) (HCC)    a. on Pradaxa; b. CHADSVASc at least 2 (HTN & female)  . Rheumatoid arthritis(714.0)   . Sleep apnea    a. not compliant with CPAP    Past Surgical History:  Procedure Laterality Date  . ABDOMINAL HYSTERECTOMY    . APPLICATION OF WOUND VAC Right 08/09/2017   Procedure: APPLICATION OF WOUND VAC;  Surgeon: Albertine Patricia, DPM;  Location: [facility] OR[facility];  Service: Podiatry;  Laterality: Right;  . CARDIAC CATHETERIZATION  05/24/2010   nonobstructive CAD  . CATARACT EXTRACTION W/ INTRAOCULAR LENS IMPLANT Right 06/12/2016   Dr. Darleen Crocker  . CATARACT EXTRACTION W/ INTRAOCULAR LENS IMPLANT Left 06/26/2016   Dr. Darleen Crocker  . CESAREAN SECTION    . CHOLECYSTECTOMY    . COLONOSCOPY  06/12/2011   Procedure: COLONOSCOPY;  Surgeon: Juanita Craver, MD;  Location: [facility];  Service: Endoscopy;  Laterality: N/A;  . COLONOSCOPY N/A 03/17/2013   Procedure: COLONOSCOPY;  Surgeon: Juanita Craver, MD;  Location: [facility];  Service: Endoscopy;  Laterality: N/A;  . EYE SURGERY    . FOOT ARTHRODESIS Right 07/13/2017   Procedure: ARTHRODESIS FOOT-MULTI.FUSIONS (6 JOINTS);  Surgeon: Albertine Patricia, DPM;  Location: [facility];  Service: Podiatry;  Laterality: Right;  . IRRIGATION AND DEBRIDEMENT FOOT Right 08/09/2017   Procedure: IRRIGATION AND DEBRIDEMENT FOOT;  Surgeon: Albertine Patricia, DPM;  Location: [facility];  Service: Podiatry;  Laterality: Right;  . KNEE ARTHROSCOPY     bilateral  . SINUSOTOMY    . TOOTH EXTRACTION  12/2016  . VAGINAL HYSTERECTOMY      Family History  Problem Relation Age of Onset  . Emphysema Father        smoked  . Tuberculosis Mother   . Parkinsonism Mother   . Diabetes type II Sister   .  Breast cancer Sister   . Breast cancer Maternal Aunt   . Tuberculosis Sister     Social History   Socioeconomic History  . Marital status: Married    Spouse name: Not on file  . Number of children: 1  . Years of education: Not on file  . Highest education level: Not on file  Occupational History  . 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 Smoking status: Never Smoker  .  Smokeless tobacco: Never Used  Vaping Use  . Vaping Use: Never used  Substance and Sexual Activity  . Alcohol use: No  . Drug use: No  . Sexual activity: Not Currently  Other Topics Concern  . Not on file  Social History Narrative   No living will   Husband, then daughter should be decision maker   Would accept resuscitation attempts   Not sure about tube feeds   Social Determinants of Health   Financial Resource Strain:   . Difficulty of Paying Living Expenses: Not on file  Food Insecurity: No Food Insecurity  . Worried About Charity fundraiser in the Last Year: Never true  . Ran Out of Food in the Last Year: Never true  Transportation Needs: No Transportation Needs  . Lack of Transportation (Medical): No  . Lack of Transportation (Non-Medical): No  Physical Activity:   . Days of Exercise per Week: Not on file  . Minutes of Exercise per Session: Not on file  Stress:   . Feeling of Stress : Not on file  Social Connections:   . Frequency of Communication with Friends and Family: Not on file  . Frequency of Social Gatherings with Friends and Family: Not on file  . Attends Religious Services: Not on file  . Active Member of Clubs or Organizations: Not on file  . Attends Archivist Meetings: Not on file  . Marital Status: Not on file  Intimate Partner Violence:   . Fear of Current or Ex-Partner: Not on file  . Emotionally Abused: Not on file  . Physically Abused: Not on file  . Sexually Abused: Not on file   Review of Systems  Gained  20# suddenly Noticed some increased nocturia Has been fluctuating the torsemide use No SOB      Objective:   Physical Exam Cardiovascular:     Rate and Rhythm: Normal rate and regular rhythm.     Heart sounds: No murmur heard.  No gallop.   Pulmonary:     Effort: Pulmonary effort is normal.     Breath sounds: Normal breath sounds. No wheezing or rales.  Musculoskeletal:     Cervical back: Neck supple.  Lymphadenopathy:     Cervical: No cervical adenopathy.            Assessment & Plan:

## 2019-12-19 NOTE — Addendum Note (Signed)
Addended by: Pilar Grammes on: 12/19/2019 12:01 PM   Modules accepted: Orders

## 2019-12-19 NOTE — Assessment & Plan Note (Signed)
Regular today Will continue on the pradaxa and metoprolol

## 2019-12-19 NOTE — Assessment & Plan Note (Signed)
Might want to consider increasing night dosing to help sleep (mostly uses for RLS--and mustard helps)

## 2019-12-19 NOTE — Assessment & Plan Note (Signed)
Asked her to take the torsemide every day Weight up quite a bit

## 2019-12-24 ENCOUNTER — Ambulatory Visit: Payer: Medicare Other | Admitting: Internal Medicine

## 2019-12-29 DIAGNOSIS — M170 Bilateral primary osteoarthritis of knee: Secondary | ICD-10-CM | POA: Diagnosis not present

## 2019-12-29 DIAGNOSIS — Z111 Encounter for screening for respiratory tuberculosis: Secondary | ICD-10-CM | POA: Diagnosis not present

## 2019-12-29 DIAGNOSIS — M1A00X0 Idiopathic chronic gout, unspecified site, without tophus (tophi): Secondary | ICD-10-CM | POA: Diagnosis not present

## 2019-12-29 DIAGNOSIS — M0579 Rheumatoid arthritis with rheumatoid factor of multiple sites without organ or systems involvement: Secondary | ICD-10-CM | POA: Diagnosis not present

## 2019-12-29 DIAGNOSIS — Z79899 Other long term (current) drug therapy: Secondary | ICD-10-CM | POA: Diagnosis not present

## 2020-01-03 ENCOUNTER — Other Ambulatory Visit: Payer: Self-pay | Admitting: Internal Medicine

## 2020-01-05 ENCOUNTER — Emergency Department: Payer: Medicare Other

## 2020-01-05 ENCOUNTER — Telehealth: Payer: Self-pay

## 2020-01-05 ENCOUNTER — Telehealth: Payer: Self-pay | Admitting: Internal Medicine

## 2020-01-05 ENCOUNTER — Other Ambulatory Visit: Payer: Self-pay

## 2020-01-05 ENCOUNTER — Observation Stay
Admission: EM | Admit: 2020-01-05 | Discharge: 2020-01-06 | Disposition: A | Discharge status: Home / Self Care | Payer: Medicare Other | Attending: Internal Medicine | Admitting: Internal Medicine

## 2020-01-05 DIAGNOSIS — G4730 Sleep apnea, unspecified: Secondary | ICD-10-CM | POA: Diagnosis present

## 2020-01-05 DIAGNOSIS — E782 Mixed hyperlipidemia: Secondary | ICD-10-CM

## 2020-01-05 DIAGNOSIS — I10 Essential (primary) hypertension: Secondary | ICD-10-CM | POA: Diagnosis not present

## 2020-01-05 DIAGNOSIS — I509 Heart failure, unspecified: Secondary | ICD-10-CM

## 2020-01-05 DIAGNOSIS — R0600 Dyspnea, unspecified: Secondary | ICD-10-CM | POA: Diagnosis not present

## 2020-01-05 DIAGNOSIS — J45901 Unspecified asthma with (acute) exacerbation: Secondary | ICD-10-CM | POA: Insufficient documentation

## 2020-01-05 DIAGNOSIS — R52 Pain, unspecified: Secondary | ICD-10-CM | POA: Diagnosis not present

## 2020-01-05 DIAGNOSIS — J449 Chronic obstructive pulmonary disease, unspecified: Secondary | ICD-10-CM | POA: Insufficient documentation

## 2020-01-05 DIAGNOSIS — I25119 Atherosclerotic heart disease of native coronary artery with unspecified angina pectoris: Secondary | ICD-10-CM | POA: Diagnosis not present

## 2020-01-05 DIAGNOSIS — B372 Candidiasis of skin and nail: Secondary | ICD-10-CM | POA: Diagnosis not present

## 2020-01-05 DIAGNOSIS — J811 Chronic pulmonary edema: Secondary | ICD-10-CM | POA: Diagnosis not present

## 2020-01-05 DIAGNOSIS — G8929 Other chronic pain: Secondary | ICD-10-CM | POA: Diagnosis not present

## 2020-01-05 DIAGNOSIS — Z79899 Other long term (current) drug therapy: Secondary | ICD-10-CM | POA: Diagnosis not present

## 2020-01-05 DIAGNOSIS — E785 Hyperlipidemia, unspecified: Secondary | ICD-10-CM | POA: Diagnosis present

## 2020-01-05 DIAGNOSIS — I110 Hypertensive heart disease with heart failure: Secondary | ICD-10-CM | POA: Diagnosis not present

## 2020-01-05 DIAGNOSIS — M545 Low back pain, unspecified: Secondary | ICD-10-CM

## 2020-01-05 DIAGNOSIS — I5023 Acute on chronic systolic (congestive) heart failure: Principal | ICD-10-CM | POA: Insufficient documentation

## 2020-01-05 DIAGNOSIS — R0602 Shortness of breath: Secondary | ICD-10-CM | POA: Diagnosis not present

## 2020-01-05 DIAGNOSIS — Z20822 Contact with and (suspected) exposure to covid-19: Secondary | ICD-10-CM | POA: Insufficient documentation

## 2020-01-05 DIAGNOSIS — R609 Edema, unspecified: Secondary | ICD-10-CM | POA: Diagnosis not present

## 2020-01-05 DIAGNOSIS — I5043 Acute on chronic combined systolic (congestive) and diastolic (congestive) heart failure: Secondary | ICD-10-CM | POA: Diagnosis not present

## 2020-01-05 DIAGNOSIS — I517 Cardiomegaly: Secondary | ICD-10-CM | POA: Diagnosis not present

## 2020-01-05 DIAGNOSIS — J479 Bronchiectasis, uncomplicated: Secondary | ICD-10-CM

## 2020-01-05 DIAGNOSIS — J90 Pleural effusion, not elsewhere classified: Secondary | ICD-10-CM | POA: Diagnosis not present

## 2020-01-05 DIAGNOSIS — I890 Lymphedema, not elsewhere classified: Secondary | ICD-10-CM

## 2020-01-05 DIAGNOSIS — M549 Dorsalgia, unspecified: Secondary | ICD-10-CM | POA: Diagnosis present

## 2020-01-05 DIAGNOSIS — L304 Erythema intertrigo: Secondary | ICD-10-CM | POA: Diagnosis not present

## 2020-01-05 DIAGNOSIS — M109 Gout, unspecified: Secondary | ICD-10-CM | POA: Diagnosis present

## 2020-01-05 DIAGNOSIS — I480 Paroxysmal atrial fibrillation: Secondary | ICD-10-CM | POA: Diagnosis present

## 2020-01-05 DIAGNOSIS — J9811 Atelectasis: Secondary | ICD-10-CM | POA: Diagnosis not present

## 2020-01-05 LAB — CBC
HCT: 36.2 % (ref 36.0–46.0)
Hemoglobin: 10.8 g/dL — ABNORMAL LOW (ref 12.0–15.0)
MCH: 26.5 pg (ref 26.0–34.0)
MCHC: 29.8 g/dL — ABNORMAL LOW (ref 30.0–36.0)
MCV: 88.9 fL (ref 80.0–100.0)
Platelets: 230 10*3/uL (ref 150–400)
RBC: 4.07 MIL/uL (ref 3.87–5.11)
RDW: 15.6 % — ABNORMAL HIGH (ref 11.5–15.5)
WBC: 5.7 10*3/uL (ref 4.0–10.5)
nRBC: 0 % (ref 0.0–0.2)

## 2020-01-05 LAB — TROPONIN I (HIGH SENSITIVITY)
Troponin I (High Sensitivity): 3 ng/L (ref <18)
Troponin I (High Sensitivity): 5 ng/L (ref <18)

## 2020-01-05 LAB — RESPIRATORY PANEL BY RT PCR (FLU A&B, COVID)
Influenza A by PCR: NEGATIVE
Influenza B by PCR: NEGATIVE
SARS Coronavirus 2 by RT PCR: NEGATIVE

## 2020-01-05 LAB — HEPATIC FUNCTION PANEL
ALT: 13 U/L (ref 0–44)
AST: 20 U/L (ref 15–41)
Albumin: 3.2 g/dL — ABNORMAL LOW (ref 3.5–5.0)
Alkaline Phosphatase: 88 U/L (ref 38–126)
Bilirubin, Direct: 0.2 mg/dL (ref 0.0–0.2)
Indirect Bilirubin: 0.6 mg/dL (ref 0.3–0.9)
Total Bilirubin: 0.8 mg/dL (ref 0.3–1.2)
Total Protein: 6.5 g/dL (ref 6.5–8.1)

## 2020-01-05 LAB — BASIC METABOLIC PANEL
Anion gap: 9 (ref 5–15)
BUN: 9 mg/dL (ref 8–23)
CO2: 24 mmol/L (ref 22–32)
Calcium: 9.7 mg/dL (ref 8.9–10.3)
Chloride: 107 mmol/L (ref 98–111)
Creatinine, Ser: 0.7 mg/dL (ref 0.44–1.00)
GFR, Estimated: >60 mL/min (ref >60)
Glucose, Bld: 116 mg/dL — ABNORMAL HIGH (ref 70–99)
Potassium: 4.3 mmol/L (ref 3.5–5.1)
Sodium: 140 mmol/L (ref 135–145)

## 2020-01-05 LAB — BRAIN NATRIURETIC PEPTIDE: B Natriuretic Peptide: 283 pg/mL — ABNORMAL HIGH (ref 0.0–100.0)

## 2020-01-05 MED ORDER — OXYCODONE HCL 5 MG PO TABS
10.0000 mg | ORAL_TABLET | ORAL | Status: DC | PRN
  Administered 2020-01-05 – 2020-01-06 (×3): 10 mg via ORAL
  Filled 2020-01-05 (×4): qty 2

## 2020-01-05 MED ORDER — NYSTATIN 100000 UNIT/GM EX POWD
Freq: Three times a day (TID) | CUTANEOUS | Status: DC
  Filled 2020-01-05: qty 15

## 2020-01-05 MED ORDER — ALBUTEROL SULFATE HFA 108 (90 BASE) MCG/ACT IN AERS
2.0000 | INHALATION_SPRAY | Freq: Four times a day (QID) | RESPIRATORY_TRACT | Status: DC | PRN
  Filled 2020-01-05: qty 6.7

## 2020-01-05 MED ORDER — FUROSEMIDE 10 MG/ML IJ SOLN
60.0000 mg | Freq: Once | INTRAMUSCULAR | Status: AC
  Administered 2020-01-05: 60 mg via INTRAVENOUS
  Filled 2020-01-05: qty 8

## 2020-01-05 MED ORDER — IPRATROPIUM-ALBUTEROL 0.5-2.5 (3) MG/3ML IN SOLN
3.0000 mL | Freq: Three times a day (TID) | RESPIRATORY_TRACT | Status: DC
  Administered 2020-01-05: 3 mL via RESPIRATORY_TRACT
  Filled 2020-01-05: qty 3

## 2020-01-05 MED ORDER — LIDOCAINE 5 % EX PTCH
1.0000 | MEDICATED_PATCH | CUTANEOUS | Status: DC
  Administered 2020-01-05: 1 via TRANSDERMAL
  Filled 2020-01-05 (×2): qty 1

## 2020-01-05 MED ORDER — IPRATROPIUM-ALBUTEROL 0.5-2.5 (3) MG/3ML IN SOLN
3.0000 mL | Freq: Once | RESPIRATORY_TRACT | Status: AC
  Administered 2020-01-05: 3 mL via RESPIRATORY_TRACT
  Filled 2020-01-05: qty 3

## 2020-01-05 MED ORDER — FUROSEMIDE 10 MG/ML IJ SOLN
60.0000 mg | Freq: Two times a day (BID) | INTRAMUSCULAR | Status: DC
  Administered 2020-01-05 – 2020-01-06 (×2): 60 mg via INTRAVENOUS
  Filled 2020-01-05 (×2): qty 8

## 2020-01-05 NOTE — ED Notes (Signed)
Hospitalist at bedside 

## 2020-01-05 NOTE — ED Notes (Signed)
IV team at bedside 

## 2020-01-05 NOTE — ED Notes (Signed)
IV Consult ordered for patient.

## 2020-01-05 NOTE — Telephone Encounter (Signed)
Per chart review tab pt is at [facility] ED. 

## 2020-01-05 NOTE — Progress Notes (Signed)
Called report; nurse unavailable and will call back.

## 2020-01-05 NOTE — ED Triage Notes (Signed)
Pt comes into the ED via EMS from home with c/o SOB over the past several days, pt admits that she has not taken her fluid pills in over a week and did not take them regularly before that because she did not want to have to get up to the BR every 19min. Pt also states she hit her right rib area a couple of years ago and sometimes it flares up and feel like that has more to do with her SOB.

## 2020-01-05 NOTE — Progress Notes (Signed)
Gave report to Tracy, RN

## 2020-01-05 NOTE — ED Notes (Signed)
Sent MD message regarding pt pain

## 2020-01-05 NOTE — ED Provider Notes (Signed)
[facility] Emergency Department Provider Note    First MD Initiated Contact with Patient 01/05/20 1328     (approximate)  I have reviewed the triage vital signs and the nursing notes.   HISTORY  Chief Complaint Shortness of Breath    HPI Tina Pacheco is a 68 y.o. female with below listed past medical history presents to the ER for worsening shortness of breath swelling exertional dyspnea.  Patient does not wear home oxygen she has has been intolerant of her Lasix due to frequent urination keeping her up at night and having worsening exertional dyspnea.  So she stopped taking her diuretic for the past few days and has had worsening shortness of breath and dyspnea.  Denies any measured fevers or chills.  Also has a history of COPD and does feel like she is wheezing more.    Past Medical History:  Diagnosis Date  . Acute CHF (congestive heart failure) ([place]) 03/30/2019  . Arthritis    RA  . Asthma   . Basal cell carcinoma   . Cataract 2018   bilateral eyes; corrected with surgery  . Claustrophobia   . Collagen vascular disease (HCC)    RA  . COPD (chronic obstructive pulmonary disease) ([place])   . Diastolic dysfunction    a. echo 07/2014: EF 55-60%, no RWMA, GR2DD, mild MR, LA moderately dilated, PASP 38 mm Hg  . Dyslipidemia   . Dysrhythmia   . Headache    migraines  . Hemihypertrophy   . History of cardiac cath    a. 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 History of gout   . Hyperplastic colonic polyp 2003  . Hypertension   . Hypokalemia   . Morbid obesity ([place])   . PAF (paroxysmal atrial fibrillation) (HCC)    a. on Pradaxa; b. CHADSVASc at least 2 (HTN & female)  . Rheumatoid arthritis(714.0)   . Sleep apnea    a. not compliant with CPAP   Family History  Problem Relation Age of Onset  . Emphysema Father        smoked  . Tuberculosis Mother   . Parkinsonism Mother   . Diabetes type II Sister   . Breast cancer Sister   .  Breast cancer Maternal Aunt   . Tuberculosis Sister    Past Surgical History:  Procedure Laterality Date  . ABDOMINAL HYSTERECTOMY    . APPLICATION OF WOUND VAC Right 08/09/2017   Procedure: APPLICATION OF WOUND VAC;  Surgeon: Albertine Patricia, DPM;  Location: [facility] ORS;  Service: Podiatry;  Laterality: Right;  . CARDIAC CATHETERIZATION  05/24/2010   nonobstructive CAD  . CATARACT EXTRACTION W/ INTRAOCULAR LENS IMPLANT Right 06/12/2016   Dr. Darleen Crocker  . CATARACT EXTRACTION W/ INTRAOCULAR LENS IMPLANT Left 06/26/2016   Dr. Darleen Crocker  . CESAREAN SECTION    . CHOLECYSTECTOMY    . COLONOSCOPY  06/12/2011   Procedure: COLONOSCOPY;  Surgeon: Juanita Craver, MD;  Location: [facility];  Service: Endoscopy;  Laterality: N/A;  . COLONOSCOPY N/A 03/17/2013   Procedure: COLONOSCOPY;  Surgeon: Juanita Craver, MD;  Location: [facility];  Service: Endoscopy;  Laterality: N/A;  . EYE SURGERY    . FOOT ARTHRODESIS Right 07/13/2017   Procedure: ARTHRODESIS FOOT-MULTI.FUSIONS (6 JOINTS);  Surgeon: Albertine Patricia, DPM;  Location: [facility] ORS;  Service: Podiatry;  Laterality: Right;  . IRRIGATION AND DEBRIDEMENT FOOT Right 08/09/2017   Procedure: IRRIGATION AND DEBRIDEMENT FOOT;  Surgeon: Albertine Patricia, DPM;  Location: [facility] OR[facility];  Service: Podiatry;  Laterality: Right;  . KNEE ARTHROSCOPY     bilateral  . SINUSOTOMY    . TOOTH EXTRACTION  12/2016  . VAGINAL HYSTERECTOMY     Patient Active Problem List   Diagnosis Date Noted  . Chronic diastolic heart failure ([place])   . Pressure injury of skin 03/31/2019  . COPD (chronic obstructive pulmonary disease) ([place]) 03/30/2019  . Urge incontinence of urine 12/23/2018  . Left leg pain 09/18/2018  . 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 Preventative health care 03/20/2018  . MDD (major depressive disorder), single episode, moderate ([place]) 03/20/2018  . Advance directive discussed with patient 03/20/2018  . Ongoing leg pain 12/16/2017  .  Lymphedema 11/26/2017  . Narcotic dependence ([place]) 03/09/2017  . Vertigo 06/28/2016  . Bronchiectasis without acute exacerbation ([place]) 06/15/2015  . DDD (degenerative disc disease), lumbosacral 03/30/2015  . Sleep apnea   . Atherosclerotic heart disease of native coronary artery with angina pectoris ([place]) 08/25/2014  . PAF (paroxysmal atrial fibrillation) ([place]) 08/25/2014  . 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 RLS (restless legs syndrome) 07/06/2014  . Gout 05/26/2014  . Chronic pain syndrome 05/26/2014  . Migraine 04/08/2014  . Acquired deformity of arm 01/05/2014  . Back pain 07/22/2013  . Metatarsalgia of right foot 07/22/2013  . Insomnia 07/15/2013  . OA (osteoarthritis) 11/20/2012  . DJD (degenerative joint disease) of cervical spine 09/10/2012  . Trapezius muscle spasm 07/21/2011  . Allergic rhinitis 07/10/2011  . Rheumatoid arthritis ([place]) 03/06/2011  . Morbid obesity ([place]) 06/22/2010  . HLD (hyperlipidemia) 06/22/2010  . HTN (hypertension) 06/22/2010      Prior to Admission medications   Medication Sig Start Date End Date Taking? Authorizing Provider  amLODipine (NORVASC) 5 MG tablet TAKE 1 TABLET BY MOUTH EVERY DAY IN THE EVENING 09/04/19   Viviana Simpler I, MD  atorvastatin (LIPITOR) 20 MG tablet TAKE 1 TABLET DAILY AT 6PM 12/03/18   Venia Carbon, MD  diphenhydrAMINE (BENADRYL) 25 MG tablet Take 25 mg by mouth daily as needed for allergies.     [provider]  folic acid (FOLVITE) 1 MG tablet Take 1 mg by mouth daily.    [provider]  gabapentin (NEURONTIN) 300 MG capsule Take 1 capsule 3 times a day (Take 1 capsule 2-3 hours before bedtime) 01/15/19   Venia Carbon, MD  hydroxychloroquine (PLAQUENIL) 200 MG tablet Take 200 mg by mouth 2 (two) times daily.     [provider]  leflunomide (ARAVA) 20 MG tablet Take 20 mg by mouth every morning.  09/20/11   [provider]  meclizine (ANTIVERT) 25 MG tablet  TAKE 1 TABLET(25 MG) BY MOUTH THREE TIMES DAILY AS NEEDED FOR DIZZINESS 10/20/19   Venia Carbon, MD  metoprolol succinate (TOPROL-XL) 50 MG 24 hr tablet TAKE 1 TABLET BY MOUTH EVERY DAY WITH OR IMMEDIATELY FOLLOWING A MEAL Patient taking differently: Take 50 mg by mouth at bedtime.  01/16/19   Venia Carbon, MD  montelukast (SINGULAIR) 10 MG tablet TAKE 1 TABLET BY MOUTH AT BEDTIME 02/07/19   Venia Carbon, MD  Multiple Vitamin (MULTIVITAMIN) tablet Take 1 tablet by mouth daily.      [provider]  Oxycodone HCl 20 MG TABS Take 1 tablet (20 mg total) by mouth in the morning and at bedtime. Substitute for prev rx 12/08/19   Venia Carbon, MD  polyethylene glycol (MIRALAX / GLYCOLAX) packet Take 17 g by mouth daily  as needed for mild constipation. 07/16/17   Henreitta Leber, MD  potassium chloride SA (KLOR-CON) 20 MEQ tablet TAKE 1/2 TABLET BY MOUTH TWICE A DAY(MAY TAKE AN EXTRA TABLET WHEN TAKING TORSEMIDE) 06/09/19   Walker, Martie Lee, NP  PRADAXA 150 MG CAPS capsule TAKE 1 CAPSULE TWICE DAILY 10/21/19   Gollan, Kathlene November, MD  SF 5000 PLUS 1.1 % CREA dental cream Place 1 application onto teeth 2 (two) times daily. 06/01/17   [provider]  sodium chloride (OCEAN) 0.65 % SOLN nasal spray Place 1 spray into both nostrils as needed for congestion.    [provider]  terazosin (HYTRIN) 10 MG capsule TAKE 1 CAPSULE(10 MG) BY MOUTH AT BEDTIME 01/03/20   Viviana Simpler I, MD  tolterodine (DETROL LA) 2 MG 24 hr capsule TAKE 1 CAPSULE(2 MG) BY MOUTH DAILY AS NEEDED FOR BLADDER SPASMS 05/30/19   Venia Carbon, MD  torsemide (DEMADEX) 20 MG tablet Take 20 mg by mouth twice daily as needed for swelling Patient taking differently: Take 20 mg by mouth daily as needed. Take 20 mg by mouth twice daily as needed for swelling 05/14/19   Venia Carbon, MD    Allergies Fluoxetine and Codeine    Social History Social History   Tobacco Use  . Smoking status: Never  Smoker  . Smokeless tobacco: Never Used  Vaping Use  . Vaping Use: Never used  Substance Use Topics  . Alcohol use: No  . Drug use: No    Review of Systems Patient denies headaches, rhinorrhea, blurry vision, numbness, shortness of breath, chest pain, edema, cough, abdominal pain, nausea, vomiting, diarrhea, dysuria, fevers, rashes or hallucinations unless otherwise stated above in HPI. ____________________________________________   PHYSICAL EXAM:  VITAL SIGNS: Vitals:   01/05/20 1046 01/05/20 1408  BP: 130/61 (!) 155/69  Pulse: 63 66  Resp: 18 18  Temp: 97.7 F (36.5 C)   SpO2: 96% 100%    Constitutional: Alert and oriented.  Appears very short of breath Eyes: Conjunctivae are normal.  Head: Atraumatic. Nose: No congestion/rhinnorhea. Mouth/Throat: Mucous membranes are moist.   Neck: No stridor. Painless ROM.  Cardiovascular: Normal rate, regular rhythm. Grossly normal heart sounds.  Good peripheral circulation. Respiratory: Use of accessory muscles speaking in 1 or 2 word phrases.  Diminished breath sounds throughout.  Faint expiratory wheezes appreciated.   Gastrointestinal: Soft and nontender. No distention. No abdominal bruits. No CVA tenderness. Genitourinary:  Musculoskeletal: No lower extremity tenderness, 2+ BLE edema.  No joint effusions. Neurologic:  Normal speech and language. No gross focal neurologic deficits are appreciated. No facial droop Skin:  Skin is warm, dry and intact. No rash noted. Psychiatric: Mood and affect are normal. Speech and behavior are normal.  ____________________________________________   LABS (all labs ordered are listed, but only abnormal results are displayed)  Results for orders placed or performed during the hospital encounter of 01/05/20 (from the past 24 hour(s))  Basic metabolic panel     Status: Abnormal   Collection Time: 01/05/20 11:02 AM  Result Value Ref Range   Sodium 140 135 - 145 mmol/L   Potassium 4.3 3.5 - 5.1  mmol/L   Chloride 107 98 - 111 mmol/L   CO2 24 22 - 32 mmol/L   Glucose, Bld 116 (H) 70 - 99 mg/dL   BUN 9 8 - 23 mg/dL   Creatinine, Ser 0.70 0.44 - 1.00 mg/dL   Calcium 9.7 8.9 - 10.3 mg/dL   GFR, Estimated >60 >60  mL/min   Anion gap 9 5 - 15  CBC     Status: Abnormal   Collection Time: 01/05/20 11:02 AM  Result Value Ref Range   WBC 5.7 4.0 - 10.5 K/uL   RBC 4.07 3.87 - 5.11 MIL/uL   Hemoglobin 10.8 (L) 12.0 - 15.0 g/dL   HCT 36.2 36 - 46 %   MCV 88.9 80.0 - 100.0 fL   MCH 26.5 26.0 - 34.0 pg   MCHC 29.8 (L) 30.0 - 36.0 g/dL   RDW 15.6 (H) 11.5 - 15.5 %   Platelets 230 150 - 400 K/uL   nRBC 0.0 0.0 - 0.2 %  Hepatic function panel     Status: Abnormal   Collection Time: 01/05/20 11:02 AM  Result Value Ref Range   Total Protein 6.5 6.5 - 8.1 g/dL   Albumin 3.2 (L) 3.5 - 5.0 g/dL   AST 20 15 - 41 U/L   ALT 13 0 - 44 U/L   Alkaline Phosphatase 88 38 - 126 U/L   Total Bilirubin 0.8 0.3 - 1.2 mg/dL   Bilirubin, Direct 0.2 0.0 - 0.2 mg/dL   Indirect Bilirubin 0.6 0.3 - 0.9 mg/dL  Troponin I (High Sensitivity)     Status: None   Collection Time: 01/05/20 11:02 AM  Result Value Ref Range   Troponin I (High Sensitivity) 3 <18 ng/L  Respiratory Panel by RT PCR (Flu A&B, Covid) - Nasopharyngeal Swab     Status: None   Collection Time: 01/05/20  2:06 PM   Specimen: Nasopharyngeal Swab  Result Value Ref Range   SARS Coronavirus 2 by RT PCR NEGATIVE NEGATIVE   Influenza A by PCR NEGATIVE NEGATIVE   Influenza B by PCR NEGATIVE NEGATIVE  Brain natriuretic peptide     Status: Abnormal   Collection Time: 01/05/20  2:06 PM  Result Value Ref Range   B Natriuretic Peptide 283.0 (H) 0.0 - 100.0 pg/mL  Troponin I (High Sensitivity)     Status: None   Collection Time: 01/05/20  2:06 PM  Result Value Ref Range   Troponin I (High Sensitivity) 5 <18 ng/L   ____________________________________________  EKG My review and personal interpretation at Time: 11:03   Indication: sob  Rate: 65   Rhythm: sinus Axis: normal Other: normal intervals, no stemi ____________________________________________  RADIOLOGY  I personally reviewed all radiographic images ordered to evaluate for the above acute complaints and reviewed radiology reports and findings.  These findings were personally discussed with the patient.  Please see medical record for radiology report.  ____________________________________________   PROCEDURES  Procedure(s) performed:  Procedures    Critical Care performed: no ____________________________________________   INITIAL IMPRESSION / ASSESSMENT AND PLAN / ED COURSE  Pertinent labs & imaging results that were available during my care of the patient were reviewed by me and considered in my medical decision making (see chart for details).   DDX: Asthma, copd, CHF, pna, ptx, malignancy, Pe, anemia   ANAMARIE HUNN is a 69 y.o. who presents to the ED with presentation as described above.  Patient protecting airway but is very dyspneic appearing even at rest.  Exam as described above.  I have a high suspicion this is volume overload and acute on chronic congestive heart failure.  Does have some faint wheezing therefore will trial nebulizer she also has a history of COPD.  Does not seem consistent with PE or pneumonia.  The patient will be placed on continuous pulse oximetry and telemetry for monitoring.  Laboratory evaluation will be sent to evaluate for the above complaints.     Clinical Course as of Jan 05 1552  [facility] Jan 05, 2020  1552 Patient does seem to be having some response with IV Lasix and diuresis.  Did not feel that much of a difference after breathing treatments.  On review the record she has had over 20 pound weight gain in the past week or 2.  Differential require hospitalization for aggressive IV diuresis.   [PR]    Clinical Course User Index [PR] Merlyn Lot, MD    The patient was evaluated in Emergency Department today for the  symptoms described in the history of present illness. He/she was evaluated in the context of the global COVID-19 pandemic, which necessitated consideration that the patient might be at risk for infection with the SARS-CoV-2 virus that causes COVID-19. Institutional protocols and algorithms that pertain to the evaluation of patients at risk for COVID-19 are in a state of rapid change based on information released by regulatory bodies including the CDC and federal and state organizations. These policies and algorithms were followed during the patient's care in the ED.  As part of my medical decision making, I reviewed the following data within the [place] notes reviewed and incorporated, Labs reviewed, notes from prior ED visits and [place] Controlled Substance Database   ____________________________________________   FINAL CLINICAL IMPRESSION(S) / ED DIAGNOSES  Final diagnoses:  Dyspnea, unspecified type  Acute on chronic combined systolic and diastolic CHF (congestive heart failure) ([place])      NEW MEDICATIONS STARTED DURING THIS VISIT:  New Prescriptions   No medications on file     Note:  This document was prepared using Dragon voice recognition software and may include unintentional dictation errors.    Merlyn Lot, MD 01/05/20 1553

## 2020-01-05 NOTE — H&P (Addendum)
History and Physical   Tina Pacheco:[id number] DOB: 08/27/1950 DOA: 01/05/2020  PCP: Venia Carbon, MD  Outpatient Specialists: Dr. Rockey Situ, [facility] and Dr. Shirlyn Goltz Patient coming from: home  I have personally briefly reviewed patient's old medical records in [place].  Chief Concern: shortness of breath worsening x3 days  HPI: Tina Pacheco is a 69 y.o. female with medical history significant for rheumatoid arthritis, stage II moderate active, chronic gout, osteoarthritis of the right knee, long-term use of high-risk medications, depressive disorder, neuropathy, hyperlipidemia, morbid obesity, hypertension grade 1 diastolic dysfunction, presents to the emergency department for chief concerns of worsening shortness of breath x3 days.  She reports that in the last 3 days, she has experienced worsening shortness of breath walking from bedroom to bathroom.  She endorses increased urination and increased urgency.  She states that she has not taken her torsemide for the last 3 days because she has been peeing a lot.  She endorses some baseline shortness of breath however this is worse than her baseline.  Denies HA, vision changes, nausea, vomiting, chest pain, abdominal pain, dysuria, hematuria, diarrhea, fever, chills.  She endorses right mid thoracic back pain from launching herself on couch this past few days.  He states that she experiences insomnia and sleeps in recliner.  Has not changed.  Social history: lives at home with husband, retired and worked for [employer], denies tobacco, etoh, recreational.   ED Course: Discussed with ED provider to admit for heart failure exacerbation.  Review of Systems: As per HPI otherwise 10 point review of systems negative.  Assessment/Plan  Principal Problem:   Acute exacerbation of congestive heart failure (HCC) Active Problems:   HLD (hyperlipidemia)   HTN (hypertension)   Back pain   Gout   PAF (paroxysmal atrial  fibrillation) (HCC)   Sleep apnea   Bronchiectasis without acute exacerbation (HCC)   Lymphedema   COPD (chronic obstructive pulmonary disease) (HCC)   Intertrigo of genitocrural region due to Candida species   Intertrigo   Heart failure (HCC)   Shortness of breath-multifactorial, including heart failure exacerbation versus COPD exacerbation versus possible UTI versus untreated OSA with OHS Acute on chronic heart failure exacerbation-secondary to not taking her medications Grade 1 diastolic dysfunction -Elevated BNP at 283 -Echo ordered -Lasix 60 mg IV twice daily -Strict I's and O's -Reassess volume status daily  -heart healthy diet with 2 L fluid restriction  COPD-from second hand smoke -Her father smoked inside the house and her husband smokes inside the house -DuoNeb scheduled as patient states that she does not ask for them if they are ordered as as needed -Albuterol as needed  Urgency and frequency-suspect UTI -UA ordered in place -Ceftriaxone IV 2 g  Midthoracic lateral to the right breast musculoskeletal pain-present on admission secondary to patient reporting that she left across the couch to get a bottle of soda -Reproducible with palpation -Lidocaine patch ordered  Candida intertrigo bilateral inguinal and under breast - present on admission -Nystatin powder 3 times daily to the under breasts and bilateral groin  Rheumatoid arthritis-in remission, hydroxychloroquine 200 mg daily, leflunomide 20 mg oral daily  Morbid obesity suspect OSA with OHS-CPAP nightly, patient would benefit from outpatient evaluation for sleep study  Paroxysmal A. fib-controlled resume home Pradaxa 150 mg twice daily and metoprolol succinate 50 mg daily Chronic low back pain-resumed home oxycodone 20 mg twice daily as needed for back pain Hypertension-amlodipine 5 mg daily resumed Hyperlipidemia-20 mg nightly Gout-in remission  DVT prophylaxis: Enoxaparin Code Status: full code Diet:  heart healthy Family Communication: Husband is at bedside Disposition Plan: Pending clinical course Consults called: None at this time Admission status: Observation with telemetry  Past Medical History:  Diagnosis Date  . Acute CHF (congestive heart failure) ([place]) 03/30/2019  . Arthritis    RA  . Asthma   . Basal cell carcinoma   . Cataract 2018   bilateral eyes; corrected with surgery  . Claustrophobia   . Collagen vascular disease (HCC)    RA  . COPD (chronic obstructive pulmonary disease) ([place])   . Diastolic dysfunction    a. echo 07/2014: EF 55-60%, no RWMA, GR2DD, mild MR, LA moderately dilated, PASP 38 mm Hg  . Dyslipidemia   . Dysrhythmia   . Headache    migraines  . Hemihypertrophy   . History of cardiac cath    a. 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 History of gout   . Hyperplastic colonic polyp 2003  . Hypertension   . Hypokalemia   . Morbid obesity ([place])   . PAF (paroxysmal atrial fibrillation) (HCC)    a. on Pradaxa; b. CHADSVASc at least 2 (HTN & female)  . Rheumatoid arthritis(714.0)   . Sleep apnea    a. not compliant with CPAP   Past Surgical History:  Procedure Laterality Date  . ABDOMINAL HYSTERECTOMY    . APPLICATION OF WOUND VAC Right 08/09/2017   Procedure: APPLICATION OF WOUND VAC;  Surgeon: Albertine Patricia, DPM;  Location: [facility] ORS;  Service: Podiatry;  Laterality: Right;  . CARDIAC CATHETERIZATION  05/24/2010   nonobstructive CAD  . CATARACT EXTRACTION W/ INTRAOCULAR LENS IMPLANT Right 06/12/2016   Dr. Darleen Crocker  . CATARACT EXTRACTION W/ INTRAOCULAR LENS IMPLANT Left 06/26/2016   Dr. Darleen Crocker  . CESAREAN SECTION    . CHOLECYSTECTOMY    . COLONOSCOPY  06/12/2011   Procedure: COLONOSCOPY;  Surgeon: Juanita Craver, MD;  Location: [facility];  Service: Endoscopy;  Laterality: N/A;  . COLONOSCOPY N/A 03/17/2013   Procedure: COLONOSCOPY;  Surgeon: Juanita Craver, MD;  Location: [facility];  Service: Endoscopy;  Laterality: N/A;  .  EYE SURGERY    . FOOT ARTHRODESIS Right 07/13/2017   Procedure: ARTHRODESIS FOOT-MULTI.FUSIONS (6 JOINTS);  Surgeon: Albertine Patricia, DPM;  Location: [facility] ORS;  Service: Podiatry;  Laterality: Right;  . IRRIGATION AND DEBRIDEMENT FOOT Right 08/09/2017   Procedure: IRRIGATION AND DEBRIDEMENT FOOT;  Surgeon: Albertine Patricia, DPM;  Location: [facility] OR[facility];  Service: Podiatry;  Laterality: Right;  . KNEE ARTHROSCOPY     bilateral  . SINUSOTOMY    . TOOTH EXTRACTION  12/2016  . VAGINAL HYSTERECTOMY     Social History:  reports that she has never smoked. She has never used smokeless tobacco. She reports that she does not drink alcohol and does not use drugs.  Allergies  Allergen Reactions  . Fluoxetine     Headache, shaking, sleep issues  . Codeine     Nausea and vomiting/only when taking too much   Family History  Problem Relation Age of Onset  . Emphysema Father        smoked  . Tuberculosis Mother   . Parkinsonism Mother   . Diabetes type II Sister   . Breast cancer Sister   . Breast cancer Maternal Aunt   . Tuberculosis Sister    Family history: Family history reviewed and father had emphysema  Prior to Admission medications   Medication Sig Start Date End Date Taking?  Authorizing Provider  amLODipine (NORVASC) 5 MG tablet TAKE 1 TABLET BY MOUTH EVERY DAY IN THE EVENING 09/04/19   Viviana Simpler I, MD  atorvastatin (LIPITOR) 20 MG tablet TAKE 1 TABLET DAILY AT 6PM 12/03/18   Venia Carbon, MD  diphenhydrAMINE (BENADRYL) 25 MG tablet Take 25 mg by mouth daily as needed for allergies.     [provider]  folic acid (FOLVITE) 1 MG tablet Take 1 mg by mouth daily.    [provider]  gabapentin (NEURONTIN) 300 MG capsule Take 1 capsule 3 times a day (Take 1 capsule 2-3 hours before bedtime) 01/15/19   Venia Carbon, MD  hydroxychloroquine (PLAQUENIL) 200 MG tablet Take 200 mg by mouth 2 (two) times daily.     [provider]  leflunomide (ARAVA) 20 MG  tablet Take 20 mg by mouth every morning.  09/20/11   [provider]  meclizine (ANTIVERT) 25 MG tablet TAKE 1 TABLET(25 MG) BY MOUTH THREE TIMES DAILY AS NEEDED FOR DIZZINESS 10/20/19   Venia Carbon, MD  metoprolol succinate (TOPROL-XL) 50 MG 24 hr tablet TAKE 1 TABLET BY MOUTH EVERY DAY WITH OR IMMEDIATELY FOLLOWING A MEAL Patient taking differently: Take 50 mg by mouth at bedtime.  01/16/19   Venia Carbon, MD  montelukast (SINGULAIR) 10 MG tablet TAKE 1 TABLET BY MOUTH AT BEDTIME 02/07/19   Venia Carbon, MD  Multiple Vitamin (MULTIVITAMIN) tablet Take 1 tablet by mouth daily.      [provider]  Oxycodone HCl 20 MG TABS Take 1 tablet (20 mg total) by mouth in the morning and at bedtime. Substitute for prev rx 12/08/19   Venia Carbon, MD  polyethylene glycol (MIRALAX / GLYCOLAX) packet Take 17 g by mouth daily as needed for mild constipation. 07/16/17   Henreitta Leber, MD  potassium chloride SA (KLOR-CON) 20 MEQ tablet TAKE 1/2 TABLET BY MOUTH TWICE A DAY(MAY TAKE AN EXTRA TABLET WHEN TAKING TORSEMIDE) 06/09/19   Walker, Martie Lee, NP  PRADAXA 150 MG CAPS capsule TAKE 1 CAPSULE TWICE DAILY 10/21/19   Gollan, Kathlene November, MD  SF 5000 PLUS 1.1 % CREA dental cream Place 1 application onto teeth 2 (two) times daily. 06/01/17   [provider]  sodium chloride (OCEAN) 0.65 % SOLN nasal spray Place 1 spray into both nostrils as needed for congestion.    [provider]  terazosin (HYTRIN) 10 MG capsule TAKE 1 CAPSULE(10 MG) BY MOUTH AT BEDTIME 01/03/20   Viviana Simpler I, MD  tolterodine (DETROL LA) 2 MG 24 hr capsule TAKE 1 CAPSULE(2 MG) BY MOUTH DAILY AS NEEDED FOR BLADDER SPASMS 05/30/19   Venia Carbon, MD  torsemide (DEMADEX) 20 MG tablet Take 20 mg by mouth twice daily as needed for swelling Patient taking differently: Take 20 mg by mouth daily as needed. Take 20 mg by mouth twice daily as needed for swelling 05/14/19   Venia Carbon, MD    Physical Exam: Vitals:   01/05/20 1916 01/05/20 2024 01/05/20 2128 01/05/20 2246  BP: 133/76 (!) 158/67 (!) 156/65 (!) 155/65  Pulse: 60 65 68 66  Resp: 17 14 17 20   Temp: 97.9 F (36.6 C) 98 F (36.7 C) 98 F (36.7 C) 98 F (36.7 C)  TempSrc:      SpO2: 98% 98% 95% 95%  Weight:      Height:       Constitutional: appears age appropriate, NAD, calm, comfortable Eyes: PERRL, lids  and conjunctivae normal ENMT: Mucous membranes are moist. Posterior pharynx clear of any exudate or lesions. Age-appropriate dentition. Hearing appropriate Neck: normal, supple, no masses, no thyromegaly Respiratory: clear to auscultation bilaterally, no crackles. Mild wheezing in left lung, Increased respiratory effort. Use of accessory muscle use.  Cardiovascular: Regular rate and rhythm, no murmurs / rubs / gallops. 2+ pitting b/l lower extremity edema. 2+ pedal pulses. No carotid bruits.  Abdomen: obese with panus, no tenderness, no masses palpated, no hepatosplenomegaly. Bowel sounds positive.  Musculoskeletal: no clubbing / cyanosis. No joint deformity upper and lower extremities. Good ROM, no contractures, no atrophy. Normal muscle tone.  Skin: bilateral inguinal rashes in groin and bilateral under breast, lesions, ulcers. No induration Neurologic: CN 2-12 grossly intact. Sensation intact. Strength 5/5 in all 4.  Psychiatric: Normal judgment and insight. Alert and oriented x 3. Normal mood.   EKG: Independently reviewed, showing normal sinus rhythm, rate of 64, no ST elevation, QTc 445  Chest x-ray on Admission: Personally reviewed and I agree with radiologist reading as below.  DG Chest 2 View  Result Date: 01/05/2020 CLINICAL DATA:  Shortness of breath. EXAM: CHEST - 2 VIEW COMPARISON:  One-view chest x-ray 03/30/2019 FINDINGS: The heart is enlarged. Moderate pass scratched at moderate pulmonary vascular congestion is present. Mild edema is noted. Small effusion is present. Minimal fluid is noted  in the right minor fissure as well. Mild atelectasis is present without other airspace disease. Exaggerated thoracic kyphosis is again noted. IMPRESSION: Cardiomegaly with moderate pulmonary vascular congestion and mild edema compatible with congestive heart failure. Small effusions are present with associated atelectasis. Electronically Signed   By: San Morelle M.D.   On: 01/05/2020 11:28   Labs on Admission: I have personally reviewed following labs  CBC: Recent Labs  Lab 01/05/20 1102  WBC 5.7  HGB 10.8*  HCT 36.2  MCV 88.9  PLT 297   Basic Metabolic Panel: Recent Labs  Lab 01/05/20 1102  NA 140  K 4.3  CL 107  CO2 24  GLUCOSE 116*  BUN 9  CREATININE 0.70  CALCIUM 9.7   GFR: Estimated Creatinine Clearance: 102.2 mL/min (by C-G formula based on SCr of 0.7 mg/dL). Liver Function Tests: Recent Labs  Lab 01/05/20 1102  AST 20  ALT 13  ALKPHOS 88  BILITOT 0.8  PROT 6.5  ALBUMIN 3.2*   Urine analysis:    Component Value Date/Time   COLORURINE YELLOW (A) 12/10/2019 0140   APPEARANCEUR CLEAR (A) 12/10/2019 0140   APPEARANCEUR Cloudy 03/28/2014 1033   LABSPEC 1.009 12/10/2019 0140   LABSPEC 1.018 03/28/2014 1033   PHURINE 7.0 12/10/2019 0140   GLUCOSEU NEGATIVE 12/10/2019 0140   GLUCOSEU Negative 03/28/2014 1033   HGBUR NEGATIVE 12/10/2019 0140   BILIRUBINUR NEGATIVE 12/10/2019 0140   BILIRUBINUR Negative 03/28/2014 1033   KETONESUR NEGATIVE 12/10/2019 0140   PROTEINUR 30 (A) 12/10/2019 0140   NITRITE NEGATIVE 12/10/2019 0140   LEUKOCYTESUR NEGATIVE 12/10/2019 0140   LEUKOCYTESUR 3+ 03/28/2014 1033   Emelynn Rance N Felise Georgia D.O. [facility]  If 12AM-7AM, please contact overnight-coverage provider If 7AM-7PM, please contact day coverage provider [email or web address]  01/06/2020, 12:15 AM

## 2020-01-05 NOTE — Telephone Encounter (Signed)
Has been triaged but not seen yet. Will await their evaluation

## 2020-01-05 NOTE — Telephone Encounter (Signed)
Opened in error sent to traige

## 2020-01-05 NOTE — Telephone Encounter (Signed)
[facility] - Client TELEPHONE ADVICE RECORD [facility] Patient Name: CAELI LINEHAN Gender: Female DOB: September 01, 1950 Age: 69 Y 30 M Return Phone Number: [phone] (Primary) Address: City/State/Zip: [place] [address] Client [facility] - Client Client Site [place] - Day Physician [name] Contact Type Call Who Is Calling Patient / Member / Family / Caregiver Call Type Triage / Clinical Relationship To Patient Self Return Phone Number [phone] (Primary) Chief Complaint BREATHING - shortness of breath or sounds breathless Reason for Call Symptomatic / Request for [place] states she has a rash and pain on her side with shortness of breath. [facility]. Translation No Nurse Assessment Nurse: Raenette Rover, RN, Zella Ball Date/Time [name]: 01/05/2020 8:55:52 AM Confirm and document reason for call. If symptomatic, describe symptoms. ---Caller states she has a rash and pain on her side with shortness of breath. Pain is under the right breast. Area is tender from previous injury and reinjured a few days ago and now every time she breaths she cant take a deep breath. Unable to get to the bathroom without getting sever sob. Does the patient have any new or worsening symptoms? ---Yes Will a triage be completed? ---Yes Related visit to physician within the last 2 weeks? ---No Does the PT have any chronic conditions? (i.e. diabetes, asthma, this includes High risk factors for pregnancy, etc.) ---Yes List chronic conditions. ---htn, high cholesterol Is this a behavioral health or substance abuse call? ---No Guidelines Guideline Title Affirmed Question Affirmed Notes Nurse Date/Time (Eastern Time) Breathing Difficulty SEVERE difficulty breathing (e.g., struggling for each breath, speaks in single words) Raenette Rover, RN, Zella Ball  01/05/2020 8:58:14 AM Disp. Time [name] Disposition Final User 01/05/2020 8:53:08 AM Send to Urgent Queue Peggye Fothergill 01/05/2020 9:08:20 AM 911 Outcome Documentation Raenette Rover, RN, Zella Ball PLEASE NOTE: All timestamps contained within this report are represented as [place] Standard Time. CONFIDENTIALTY NOTICE: This fax transmission is intended only for the addressee. It contains information that is legally privileged, confidential or otherwise protected from use or disclosure. If you are not the intended recipient, you are strictly prohibited from reviewing, disclosing, copying using or disseminating any of this information or taking any action in reliance on or regarding this information. If you have received this fax in error, please notify us immediately by telephone so that we can arrange for its return to [place]. Phone: [phone], Toll-Free: [phone], Fax: [phone] Page: 2 of 2 Call Id: [id number] [place]. Time [name] Disposition Final User Reason: EMS on the scene. 01/05/2020 8:59:47 AM Call EMS 911 Now Yes Raenette Rover, RN, Herbert Deaner Disagree/Comply Comply Caller Understands Yes PreDisposition Did not know what to do Care Advice Given Per Guideline CALL EMS 911 NOW: CARE ADVICE given per Breathing Difficulty (Adult) guideline. Referrals GO TO FACILITY OTHER - SPECIFY

## 2020-01-06 ENCOUNTER — Telehealth: Payer: Self-pay | Admitting: Internal Medicine

## 2020-01-06 ENCOUNTER — Observation Stay
Admit: 2020-01-06 | Discharge: 2020-01-06 | Disposition: A | Discharge status: Home / Self Care | Payer: Medicare Other | Attending: Internal Medicine | Admitting: Internal Medicine

## 2020-01-06 DIAGNOSIS — I509 Heart failure, unspecified: Secondary | ICD-10-CM

## 2020-01-06 DIAGNOSIS — R0600 Dyspnea, unspecified: Secondary | ICD-10-CM | POA: Diagnosis not present

## 2020-01-06 DIAGNOSIS — I5043 Acute on chronic combined systolic (congestive) and diastolic (congestive) heart failure: Secondary | ICD-10-CM | POA: Diagnosis not present

## 2020-01-06 DIAGNOSIS — I5023 Acute on chronic systolic (congestive) heart failure: Secondary | ICD-10-CM | POA: Diagnosis not present

## 2020-01-06 LAB — ECHOCARDIOGRAM COMPLETE
AR max vel: 2.64 cm2
AV Area VTI: 2.68 cm2
AV Area mean vel: 2.6 cm2
AV Mean grad: 3.5 mmHg
AV Peak grad: 6.6 mmHg
Ao pk vel: 1.29 m/s
Height: 63 in
S' Lateral: 2.83 cm
Weight: 5827.2 oz

## 2020-01-06 LAB — URINALYSIS, ROUTINE W REFLEX MICROSCOPIC
Bacteria, UA: NONE SEEN
Bilirubin Urine: NEGATIVE
Glucose, UA: NEGATIVE mg/dL
Hgb urine dipstick: NEGATIVE
Ketones, ur: NEGATIVE mg/dL
Leukocytes,Ua: NEGATIVE
Nitrite: NEGATIVE
Protein, ur: NEGATIVE mg/dL
Specific Gravity, Urine: 1.005 (ref 1.005–1.030)
pH: 5 (ref 5.0–8.0)

## 2020-01-06 LAB — BASIC METABOLIC PANEL
Anion gap: 10 (ref 5–15)
BUN: 9 mg/dL (ref 8–23)
CO2: 28 mmol/L (ref 22–32)
Calcium: 9.4 mg/dL (ref 8.9–10.3)
Chloride: 103 mmol/L (ref 98–111)
Creatinine, Ser: 0.8 mg/dL (ref 0.44–1.00)
GFR, Estimated: >60 mL/min (ref >60)
Glucose, Bld: 102 mg/dL — ABNORMAL HIGH (ref 70–99)
Potassium: 3.8 mmol/L (ref 3.5–5.1)
Sodium: 141 mmol/L (ref 135–145)

## 2020-01-06 MED ORDER — POLYETHYLENE GLYCOL 3350 17 G PO PACK: 17.0000 g | PACK | Freq: Every day | ORAL | Status: DC | PRN

## 2020-01-06 MED ORDER — SODIUM CHLORIDE 0.9% FLUSH: 3.0000 mL | INTRAVENOUS | Status: DC | PRN

## 2020-01-06 MED ORDER — ENOXAPARIN SODIUM 40 MG/0.4ML ~~LOC~~ SOLN: 40.0000 mg | SUBCUTANEOUS | Status: DC

## 2020-01-06 MED ORDER — METOPROLOL SUCCINATE ER 50 MG PO TB24
50.0000 mg | ORAL_TABLET | Freq: Every day | ORAL | Status: DC
  Administered 2020-01-06: 50 mg via ORAL
  Filled 2020-01-06: qty 1

## 2020-01-06 MED ORDER — AMLODIPINE BESYLATE 5 MG PO TABS
5.0000 mg | ORAL_TABLET | Freq: Every day | ORAL | Status: DC
  Administered 2020-01-06: 5 mg via ORAL
  Filled 2020-01-06: qty 1

## 2020-01-06 MED ORDER — SODIUM CHLORIDE 0.9 % IV SOLN: 250.0000 mL | INTRAVENOUS | Status: DC | PRN

## 2020-01-06 MED ORDER — OXYCODONE HCL 20 MG PO TABS: 1.0000 | ORAL_TABLET | Freq: Two times a day (BID) | ORAL | 0 refills | Status: DC

## 2020-01-06 MED ORDER — MECLIZINE HCL 25 MG PO TABS
25.0000 mg | ORAL_TABLET | Freq: Three times a day (TID) | ORAL | Status: DC | PRN
  Filled 2020-01-06: qty 1

## 2020-01-06 MED ORDER — IPRATROPIUM-ALBUTEROL 0.5-2.5 (3) MG/3ML IN SOLN
3.0000 mL | Freq: Three times a day (TID) | RESPIRATORY_TRACT | Status: DC
Start: 2020-01-06 — End: 2020-01-06
  Administered 2020-01-06: 3 mL via RESPIRATORY_TRACT
  Filled 2020-01-06 (×2): qty 3

## 2020-01-06 MED ORDER — MONTELUKAST SODIUM 10 MG PO TABS: 10.0000 mg | ORAL_TABLET | Freq: Every day | ORAL | Status: DC

## 2020-01-06 MED ORDER — HYDROXYCHLOROQUINE SULFATE 200 MG PO TABS
200.0000 mg | ORAL_TABLET | Freq: Two times a day (BID) | ORAL | Status: DC
  Administered 2020-01-06: 200 mg via ORAL
  Filled 2020-01-06: qty 1

## 2020-01-06 MED ORDER — ATORVASTATIN CALCIUM 20 MG PO TABS: 20.0000 mg | ORAL_TABLET | Freq: Every day | ORAL | Status: DC

## 2020-01-06 MED ORDER — DABIGATRAN ETEXILATE MESYLATE 150 MG PO CAPS
150.0000 mg | ORAL_CAPSULE | Freq: Two times a day (BID) | ORAL | Status: DC
  Administered 2020-01-06: 150 mg via ORAL
  Filled 2020-01-06 (×2): qty 1

## 2020-01-06 MED ORDER — FOLIC ACID 1 MG PO TABS: 1.0000 mg | ORAL_TABLET | Freq: Every day | ORAL | Status: DC

## 2020-01-06 MED ORDER — LEFLUNOMIDE 20 MG PO TABS
20.0000 mg | ORAL_TABLET | ORAL | Status: DC
  Administered 2020-01-06: 20 mg via ORAL
  Filled 2020-01-06 (×2): qty 1

## 2020-01-06 MED ORDER — TERAZOSIN HCL 5 MG PO CAPS
10.0000 mg | ORAL_CAPSULE | Freq: Every day | ORAL | Status: DC
  Filled 2020-01-06: qty 2

## 2020-01-06 MED ORDER — GABAPENTIN 300 MG PO CAPS: 300.0000 mg | ORAL_CAPSULE | Freq: Three times a day (TID) | ORAL | Status: DC

## 2020-01-06 MED ORDER — SODIUM CHLORIDE 0.9% FLUSH
3.0000 mL | Freq: Two times a day (BID) | INTRAVENOUS | Status: DC
  Administered 2020-01-06 (×2): 3 mL via INTRAVENOUS

## 2020-01-06 MED ORDER — SODIUM CHLORIDE 0.9 % IV SOLN
2.0000 g | Freq: Every day | INTRAVENOUS | Status: DC
  Administered 2020-01-06: 2 g via INTRAVENOUS
  Filled 2020-01-06 (×2): qty 20

## 2020-01-06 MED ORDER — OXYCODONE HCL ER 20 MG PO T12A
20.0000 mg | EXTENDED_RELEASE_TABLET | Freq: Two times a day (BID) | ORAL | Status: DC | PRN
  Administered 2020-01-06: 20 mg via ORAL
  Filled 2020-01-06: qty 1

## 2020-01-06 MED ORDER — DIPHENHYDRAMINE HCL 25 MG PO CAPS
25.0000 mg | ORAL_CAPSULE | Freq: Four times a day (QID) | ORAL | Status: DC | PRN
  Administered 2020-01-06: 25 mg via ORAL
  Filled 2020-01-06: qty 1

## 2020-01-06 MED ORDER — ACETAMINOPHEN 325 MG PO TABS: 650.0000 mg | ORAL_TABLET | ORAL | Status: DC | PRN

## 2020-01-06 MED ORDER — GABAPENTIN 300 MG PO CAPS
300.0000 mg | ORAL_CAPSULE | Freq: Three times a day (TID) | ORAL | Status: DC
  Administered 2020-01-06 (×2): 300 mg via ORAL
  Filled 2020-01-06 (×2): qty 1

## 2020-01-06 MED ORDER — ONDANSETRON HCL 4 MG/2ML IJ SOLN: 4.0000 mg | Freq: Four times a day (QID) | INTRAMUSCULAR | Status: DC | PRN

## 2020-01-06 NOTE — Progress Notes (Signed)
*  PRELIMINARY RESULTS* Echocardiogram 2D Echocardiogram has been performed.  Sherrie Sport 01/06/2020, 2:27 PM

## 2020-01-06 NOTE — TOC Initial Note (Signed)
Transition of Care [facility]) - Initial/Assessment Note    Patient Details  Name: Tina Pacheco MRN: [id number] Date of Birth: 07-May-1950  [facility]) CM/SW Contact:    Candie Chroman, LCSW Phone Number: 01/06/2020, 12:27 PM  Clinical Narrative:  CSW met with patient. No supports at bedside. CSW introduced role and explained that discharge planning would be discussed. PCP is Viviana Simpler, MD. Patient has transportation to appointments. [facility] is [facility] [place]. No issues obtaining medications. No home health prior to admission. Patient is not interested in home health at this time. She stated she knows what she needs to do to manage her health but reports needing to work harder to do so. Patient has a scale at home but her weight exceeds what it can read. She does not want to get a new scale because she wants to lose enough weight to use her current scale. Patient has a rollator and bedside commode at home. No further concerns. CSW encouraged patient to contact CSW as needed. CSW will continue to follow patient for support and facilitate return home, potentially today.              Expected Discharge Plan: Home/Self Care Barriers to Discharge: Continued Medical Work up   Patient Goals and CMS Choice     Choice offered to / list presented to : NA  Expected Discharge Plan and Services Expected Discharge Plan: Home/Self Care     Post Acute Care Choice: NA Living arrangements for the past 2 months: Single Family Home                                      Prior Living Arrangements/Services Living arrangements for the past 2 months: Single Family Home Lives with:: Spouse Patient language and need for interpreter reviewed:: Yes Do you feel safe going back to the place where you live?: Yes      Need for Family Participation in Patient Care: Yes (Comment) Care giver support system in place?: Yes (comment) Current home services:  DME Criminal Activity/Legal Involvement Pertinent to Current Situation/Hospitalization: No - Comment as needed  Activities of Daily Living Home Assistive Devices/Equipment: Gilford Rile (specify type) ADL Screening (condition at time of admission) Patient's cognitive ability adequate to safely complete daily activities?: Yes Is the patient deaf or have difficulty hearing?: No Does the patient have difficulty seeing, even when wearing glasses/contacts?: No Does the patient have difficulty concentrating, remembering, or making decisions?: No Patient able to express need for assistance with ADLs?: Yes Does the patient have difficulty dressing or bathing?: No Independently performs ADLs?: Yes (appropriate for developmental age) Does the patient have difficulty walking or climbing stairs?: Yes Weakness of Legs: None Weakness of Arms/Hands: None  Permission Sought/Granted                  Emotional Assessment Appearance:: Appears stated age Attitude/Demeanor/Rapport: Engaged, Gracious Affect (typically observed): Accepting, Appropriate, Calm, Pleasant Orientation: : Oriented to Self, Oriented to Place, Oriented to  Time, Oriented to Situation Alcohol / Substance Use: Not Applicable Psych Involvement: No (comment)  Admission diagnosis:  Heart failure (HCC) [I50.9] Acute on chronic combined systolic and diastolic CHF (congestive heart failure) (HCC) [I50.43] Dyspnea, unspecified type [R06.00] Patient Active Problem List   Diagnosis Date Noted   Acute exacerbation of congestive heart failure ([place]) 01/06/2020   Intertrigo of genitocrural region due  to Candida species 01/05/2020   Intertrigo 01/05/2020   Heart failure ([place]) 01/05/2020   Chronic diastolic heart failure (HCC)    Pressure injury of skin 03/31/2019   COPD (chronic obstructive pulmonary disease) ([place]) 03/30/2019   Urge incontinence of urine 12/23/2018   Left leg pain 09/18/2018   Chronic gouty arthropathy without  tophi 04/01/2018   Preventative health care 03/20/2018   MDD (major depressive disorder), single episode, moderate ([place]) 03/20/2018   Advance directive discussed with patient 03/20/2018   Ongoing leg pain 12/16/2017   Lymphedema 11/26/2017   Narcotic dependence ([place]) 03/09/2017   Vertigo 06/28/2016   Bronchiectasis without acute exacerbation ([place]) 06/15/2015   DDD (degenerative disc disease), lumbosacral 03/30/2015   Sleep apnea    Atherosclerotic heart disease of native coronary artery with angina pectoris ([place]) 08/25/2014   PAF (paroxysmal atrial fibrillation) ([place]) 08/25/2014   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   RLS (restless legs syndrome) 07/06/2014   Gout 05/26/2014   Chronic pain syndrome 05/26/2014   Migraine 04/08/2014   Acquired deformity of arm 01/05/2014   Back pain 07/22/2013   Metatarsalgia of right foot 07/22/2013   Insomnia 07/15/2013   OA (osteoarthritis) 11/20/2012   DJD (degenerative joint disease) of cervical spine 09/10/2012   Trapezius muscle spasm 07/21/2011   Allergic rhinitis 07/10/2011   Rheumatoid arthritis ([place]) 03/06/2011   Morbid obesity ([place]) 06/22/2010   HLD (hyperlipidemia) 06/22/2010   HTN (hypertension) 06/22/2010   PCP:  Venia Carbon, MD Pharmacy:   [facility] [place], [place] - [address] AT [address] [place] [address] Phone: [phone] Fax: [phone]  [facility] [address] AT Portal to Registered [employer] [place] [address] Phone: [phone] Fax: [phone]     Social Determinants of Health (SDOH) Interventions    Readmission Risk Interventions No flowsheet data found.

## 2020-01-06 NOTE — Telephone Encounter (Signed)
She is in the hospital now--and I can't reach her. I did do the refill though--she should be out quickly

## 2020-01-06 NOTE — Discharge Summary (Signed)
Physician Discharge Summary  STARLA DELLER TAV:[id number] DOB: Dec 30, 1950 DOA: 01/05/2020  PCP: Venia Carbon, MD  Admit date: 01/05/2020 Discharge date: 01/06/2020  Admitted From: Home Disposition: Home  Recommendations for Outpatient Follow-up:  1. Follow up with PCP in 1-2 weeks 2. Request referral to urology for urinary frequency  Home Health: No Equipment/Devices: None Discharge Condition: Stable CODE STATUS: Full Diet recommendation: Heart Healthy / Carb Modified  Brief/Interim Summary: 69 y.o. female with medical history significant for rheumatoid arthritis, stage II moderate active, chronic gout, osteoarthritis of the right knee, long-term use of high-risk medications, depressive disorder, neuropathy, hyperlipidemia, morbid obesity, hypertension grade 1 diastolic dysfunction, presents to the emergency department for chief concerns of worsening shortness of breath x3 days.  She reports that in the last 3 days, she has experienced worsening shortness of breath walking from bedroom to bathroom.  She endorses increased urination and increased urgency.  She states that she has not taken her torsemide for the last 3 days because she has been peeing a lot.  She endorses some baseline shortness of breath however this is worse than her baseline.  11/9: Patient was started on aggressive IV diuresis.  Diuresed 5.3 L over a 19-hour course.  Dry weight is difficult ascertain however with this degree of diuresis patient reported her symptoms had improved.  She is medically stable for discharge at this time.  She was seen by the heart failure specialist while in-house and given instructions regarding outpatient heart failure management.  Patient states Dr. Rockey Situ for cardiology.  She is instructed to follow-up with him as directed.  Regarding her urinary frequency she is encouraged to see her primary care physician and request referral to urology for consideration for urodynamic studies and  further evaluation of her urinary frequency.  I encouraged her to take her torsemide daily without missing doses.  I suggested that she take her torsemide at the same time every morning to gain some control over her urinary habits.  She also does not weigh herself nor does she follow a low-sodium diet.  She was educated on the importance of these measures.  Discharge Diagnoses:  Principal Problem:   Acute exacerbation of congestive heart failure (HCC) Active Problems:   HLD (hyperlipidemia)   HTN (hypertension)   Back pain   Gout   PAF (paroxysmal atrial fibrillation) (HCC)   Sleep apnea   Bronchiectasis without acute exacerbation (HCC)   Lymphedema   COPD (chronic obstructive pulmonary disease) (HCC)   Intertrigo of genitocrural region due to Candida species   Intertrigo   Heart failure (HCC)  Shortness of breath Acute on chronic diastolic congestive heart failure -secondary to not taking her medications -Patient endorses not taking her torsemide due to urinary frequency -Elevated BNP at 283 Diurese appropriately 5.3 L net negative since admission Dry weight difficult to ascertain Stable for discharge at this time Instructed to follow-up with cardiology post discharge Instructed to see her primary care physician and request a referral to urology for urinary frequency work-up Plan: DC home Continue home diuretic dosing Educated on the importance of diuretic dosing and heart failure management at home  COPD-from second hand smoke -Her father smoked inside the house and her husband smokes inside the house -DuoNeb scheduled as patient states that she does not ask for them if they are ordered as as needed -Albuterol as needed  Urgency and frequency-suspect UTI -UA negative.  Not indicative of infection.  No antibiotics  Midthoracic lateral to the right breast musculoskeletal  pain-present on admission secondary to patient reporting that she left across the couch to get a  bottle of soda -Reproducible with palpation -Lidocaine patch ordered  Candida intertrigo bilateral inguinal and under breast - present on admission -Nystatin powder 3 times daily to the under breasts and bilateral groin  Rheumatoid arthritis-in remission, hydroxychloroquine 200 mg daily, leflunomide 20 mg oral daily  Morbid obesity suspect OSA with OHS-CPAP nightly, patient would benefit from outpatient evaluation for sleep study  Paroxysmal A. fib-controlled resume home Pradaxa 150 mg twice daily and metoprolol succinate 50 mg daily Chronic low back pain-resumed home oxycodone 20 mg twice daily as needed for back pain Hypertension-amlodipine 5 mg daily resumed Hyperlipidemia-20 mg nightly Gout-in remission  Discharge Instructions  Discharge Instructions    Diet - low sodium heart healthy   Complete by: As directed    Increase activity slowly   Complete by: As directed      Allergies as of 01/06/2020      Reactions   Fluoxetine    Headache, shaking, sleep issues   Codeine    Nausea and vomiting/only when taking too much      Medication List    TAKE these medications   amLODipine 5 MG tablet Commonly known as: NORVASC TAKE 1 TABLET BY MOUTH EVERY DAY IN THE EVENING What changed:   how much to take  how to take this  when to take this  additional instructions   atorvastatin 20 MG tablet Commonly known as: LIPITOR TAKE 1 TABLET DAILY AT 6PM What changed:   how much to take  how to take this  when to take this   diphenhydrAMINE 25 MG tablet Commonly known as: BENADRYL Take 25 mg by mouth daily as needed for allergies.   folic acid 1 MG tablet Commonly known as: FOLVITE Take 1 mg by mouth daily.   gabapentin 300 MG capsule Commonly known as: NEURONTIN Take 1 capsule 3 times a day (Take 1 capsule 2-3 hours before bedtime) What changed:   how much to take  how to take this  when to take this   hydroxychloroquine 200 MG tablet Commonly known  as: PLAQUENIL Take 200 mg by mouth 2 (two) times daily.   ketoconazole 2 % cream Commonly known as: NIZORAL Apply 1 application topically 2 (two) times daily.   leflunomide 20 MG tablet Commonly known as: ARAVA Take 20 mg by mouth every morning.   meclizine 25 MG tablet Commonly known as: ANTIVERT TAKE 1 TABLET(25 MG) BY MOUTH THREE TIMES DAILY AS NEEDED FOR DIZZINESS What changed: See the new instructions.   metoprolol succinate 50 MG 24 hr tablet Commonly known as: TOPROL-XL TAKE 1 TABLET BY MOUTH EVERY DAY WITH OR IMMEDIATELY FOLLOWING A MEAL What changed: See the new instructions.   montelukast 10 MG tablet Commonly known as: SINGULAIR TAKE 1 TABLET BY MOUTH AT BEDTIME   multivitamin tablet Take 1 tablet by mouth daily.   Oxycodone HCl 20 MG Tabs Take 1 tablet (20 mg total) by mouth in the morning and at bedtime. Substitute for prev rx   polyethylene glycol 17 g packet Commonly known as: MIRALAX / GLYCOLAX Take 17 g by mouth daily as needed for mild constipation.   potassium chloride SA 20 MEQ tablet Commonly known as: KLOR-CON TAKE 1/2 TABLET BY MOUTH TWICE A DAY(MAY TAKE AN EXTRA TABLET WHEN TAKING TORSEMIDE) What changed:   how much to take  how to take this  when to take this   Pradaxa  150 MG Caps capsule Generic drug: dabigatran TAKE 1 CAPSULE TWICE DAILY What changed: how much to take   SF 5000 Plus 1.1 % Crea dental cream Generic drug: sodium fluoride Place 1 application onto teeth 2 (two) times daily.   sodium chloride 0.65 % Soln nasal spray Commonly known as: OCEAN Place 1 spray into both nostrils as needed for congestion.   terazosin 10 MG capsule Commonly known as: HYTRIN TAKE 1 CAPSULE(10 MG) BY MOUTH AT BEDTIME What changed: See the new instructions.   tolterodine 2 MG 24 hr capsule Commonly known as: DETROL LA TAKE 1 CAPSULE(2 MG) BY MOUTH DAILY AS NEEDED FOR BLADDER SPASMS What changed: See the new instructions.   torsemide 20  MG tablet Commonly known as: DEMADEX Take 20 mg by mouth twice daily as needed for swelling What changed:   how much to take  how to take this  when to take this  reasons to take this       Follow-up Information    [facility] up on 01/19/2020.   Specialty: Cardiology Why: at 2:00pm. Enter through the [place] entrance Contact information: [address] [place]       Venia Carbon, MD. Schedule an appointment as soon as possible for a visit in 1 week.   Specialties: Internal Medicine, Pediatrics Why: Request referral to urology for urinary frequency Contact information: [place] [address] [phone]        Minna Merritts, MD.   Specialty: Cardiology Contact information: [address] [place] [address] [phone]              Allergies  Allergen Reactions  . Fluoxetine     Headache, shaking, sleep issues  . Codeine     Nausea and vomiting/only when taking too much    Consultations:  None   Procedures/Studies: DG Chest 2 View  Result Date: 01/05/2020 CLINICAL DATA:  Shortness of breath. EXAM: CHEST - 2 VIEW COMPARISON:  One-view chest x-ray 03/30/2019 FINDINGS: The heart is enlarged. Moderate pass scratched at moderate pulmonary vascular congestion is present. Mild edema is noted. Small effusion is present. Minimal fluid is noted in the right minor fissure as well. Mild atelectasis is present without other airspace disease. Exaggerated thoracic kyphosis is again noted. IMPRESSION: Cardiomegaly with moderate pulmonary vascular congestion and mild edema compatible with congestive heart failure. Small effusions are present with associated atelectasis. Electronically Signed   By: San Morelle M.D.   On: 01/05/2020 11:28   CT Head Wo Contrast  Result Date: 12/09/2019 CLINICAL DATA:   69 year old female with headache. EXAM: CT HEAD WITHOUT CONTRAST TECHNIQUE: Contiguous axial images were obtained from the base of the skull through the vertex without intravenous contrast. COMPARISON:  Head CT dated 04/04/2014. FINDINGS: Brain: There is mild age-related atrophy and moderate chronic microvascular ischemic changes. There is no acute intracranial hemorrhage. No mass effect or midline shift. No extra-axial fluid collection. Vascular: No hyperdense vessel or unexpected calcification. Skull: Normal. Negative for fracture or focal lesion. Sinuses/Orbits: There is mild mucoperiosteal thickening of paranasal sinuses with partial opacification of the left sphenoid sinus. No air-fluid level. The mastoid air cells are clear. Other: None IMPRESSION: 1. No acute intracranial pathology. 2. Age-related atrophy and chronic microvascular ischemic changes. Electronically Signed   By: Anner Crete M.D.   On: 12/09/2019 22:39    (Echo, Carotid, EGD,  Colonoscopy, ERCP)    Subjective: Seen and examined on the date of discharge.  No complaints.  Diuresed appropriately.  Stable for discharge  Discharge Exam: Vitals:   01/06/20 0832 01/06/20 1058  BP: 111/89 (!) 132/57  Pulse: 63 (!) 59  Resp:    Temp: 98.1 F (36.7 C) 98.3 F (36.8 C)  SpO2: 96% 96%   Vitals:   01/06/20 0300 01/06/20 0731 01/06/20 0832 01/06/20 1058  BP: (!) 154/63  111/89 (!) 132/57  Pulse: 63 (!) 58 63 (!) 59  Resp:  16    Temp: 98.1 F (36.7 C)  98.1 F (36.7 C) 98.3 F (36.8 C)  TempSrc: Oral  Oral Oral  SpO2: 96% 94% 96% 96%  Weight:      Height:        General: Alert and awake.  No distress Cardiovascular: Distant heart sounds.  Regular rate and rhythm.  No appreciable murmurs Respiratory: Air entry equal bilaterally.  Bibasilar crackles.  Normal work of breathing.  Room air Abdominal: Obese, soft, nontender, nondistended, normal bowel sounds Extremities: 1+ pitting edema bilaterally    The results of  significant diagnostics from this hospitalization (including imaging, microbiology, ancillary and laboratory) are listed below for reference.     Microbiology: Recent Results (from the past 240 hour(s))  Respiratory Panel by RT PCR (Flu A&B, Covid) - Nasopharyngeal Swab     Status: None   Collection Time: 01/05/20  2:06 PM   Specimen: Nasopharyngeal Swab  Result Value Ref Range Status   SARS Coronavirus 2 by RT PCR NEGATIVE NEGATIVE Final    Comment: (NOTE) SARS-CoV-2 target nucleic acids are NOT DETECTED.  The SARS-CoV-2 RNA is generally detectable in upper respiratoy specimens during the acute phase of infection. The lowest concentration of SARS-CoV-2 viral copies this assay can detect is 131 copies/mL. A negative result does not preclude SARS-Cov-2 infection and should not be used as the sole basis for treatment or other patient management decisions. A negative result may occur with  improper specimen collection/handling, submission of specimen other than nasopharyngeal swab, presence of viral mutation(s) within the areas targeted by this assay, and inadequate number of viral copies (<131 copies/mL). A negative result must be combined with clinical observations, patient history, and epidemiological information. The expected result is Negative.  Fact Sheet for Patients:  PinkCheek.be  Fact Sheet for Healthcare Providers:  GravelBags.it  This test is no t yet approved or cleared by the [place] FDA and  has been authorized for detection and/or diagnosis of SARS-CoV-2 by FDA under an Emergency Use Authorization (EUA). This EUA will remain  in effect (meaning this test can be used) for the duration of the COVID-19 declaration under Section 564(b)(1) of the Act, 21 U.S.C. section 360bbb-3(b)(1), unless the authorization is terminated or revoked sooner.     Influenza A by PCR NEGATIVE NEGATIVE Final   Influenza B by  PCR NEGATIVE NEGATIVE Final    Comment: (NOTE) The Xpert Xpress SARS-CoV-2/FLU/RSV assay is intended as an aid in  the diagnosis of influenza from Nasopharyngeal swab specimens and  should not be used as a sole basis for treatment. Nasal washings and  aspirates are unacceptable for Xpert Xpress SARS-CoV-2/FLU/RSV  testing.  Fact Sheet for Patients: PinkCheek.be  Fact Sheet for Healthcare Providers: GravelBags.it  This test is not yet approved or cleared by the [place] FDA and  has been authorized for detection and/or diagnosis of SARS-CoV-2 by  FDA under an Emergency Use Authorization (EUA). This EUA  will remain  in effect (meaning this test can be used) for the duration of the  Covid-19 declaration under Section 564(b)(1) of the Act, 21  U.S.C. section 360bbb-3(b)(1), unless the authorization is  terminated or revoked. Performed at [facility], [address] [place], [place] [address]      Labs: BNP (last 3 results) Recent Labs    03/30/19 0207 12/10/19 0010 01/05/20 1406  BNP 285.0* 171.4* 191.4*   Basic Metabolic Panel: Recent Labs  Lab 01/05/20 1102 01/06/20 0504  NA 140 141  K 4.3 3.8  CL 107 103  CO2 24 28  GLUCOSE 116* 102*  BUN 9 9  CREATININE 0.70 0.80  CALCIUM 9.7 9.4   Liver Function Tests: Recent Labs  Lab 01/05/20 1102  AST 20  ALT 13  ALKPHOS 88  BILITOT 0.8  PROT 6.5  ALBUMIN 3.2*   No results for input(s): LIPASE, AMYLASE in the last 168 hours. No results for input(s): AMMONIA in the last 168 hours. CBC: Recent Labs  Lab 01/05/20 1102  WBC 5.7  HGB 10.8*  HCT 36.2  MCV 88.9  PLT 230   Cardiac Enzymes: No results for input(s): CKTOTAL, CKMB, CKMBINDEX, TROPONINI in the last 168 hours. BNP: Invalid input(s): POCBNP CBG: No results for input(s): GLUCAP in the last 168 hours. D-Dimer No results for input(s): DDIMER in the last 72 hours. Hgb A1c No  results for input(s): HGBA1C in the last 72 hours. Lipid Profile No results for input(s): CHOL, HDL, LDLCALC, TRIG, CHOLHDL, LDLDIRECT in the last 72 hours. Thyroid function studies No results for input(s): TSH, T4TOTAL, T3FREE, THYROIDAB in the last 72 hours.  Invalid input(s): FREET3 Anemia work up No results for input(s): VITAMINB12, FOLATE, FERRITIN, TIBC, IRON, RETICCTPCT in the last 72 hours. Urinalysis    Component Value Date/Time   COLORURINE STRAW (A) 01/05/2020 0125   APPEARANCEUR CLEAR (A) 01/05/2020 0125   APPEARANCEUR Cloudy 03/28/2014 1033   LABSPEC 1.005 01/05/2020 0125   LABSPEC 1.018 03/28/2014 1033   PHURINE 5.0 01/05/2020 0125   GLUCOSEU NEGATIVE 01/05/2020 0125   GLUCOSEU Negative 03/28/2014 1033   HGBUR NEGATIVE 01/05/2020 0125   BILIRUBINUR NEGATIVE 01/05/2020 0125   BILIRUBINUR Negative 03/28/2014 1033   KETONESUR NEGATIVE 01/05/2020 0125   PROTEINUR NEGATIVE 01/05/2020 0125   NITRITE NEGATIVE 01/05/2020 0125   LEUKOCYTESUR NEGATIVE 01/05/2020 0125   LEUKOCYTESUR 3+ 03/28/2014 1033   Sepsis Labs Invalid input(s): PROCALCITONIN,  WBC,  LACTICIDVEN Microbiology Recent Results (from the past 240 hour(s))  Respiratory Panel by RT PCR (Flu A&B, Covid) - Nasopharyngeal Swab     Status: None   Collection Time: 01/05/20  2:06 PM   Specimen: Nasopharyngeal Swab  Result Value Ref Range Status   SARS Coronavirus 2 by RT PCR NEGATIVE NEGATIVE Final    Comment: (NOTE) SARS-CoV-2 target nucleic acids are NOT DETECTED.  The SARS-CoV-2 RNA is generally detectable in upper respiratoy specimens during the acute phase of infection. The lowest concentration of SARS-CoV-2 viral copies this assay can detect is 131 copies/mL. A negative result does not preclude SARS-Cov-2 infection and should not be used as the sole basis for treatment or other patient management decisions. A negative result may occur with  improper specimen collection/handling, submission of specimen  other than nasopharyngeal swab, presence of viral mutation(s) within the areas targeted by this assay, and inadequate number of viral copies (<131 copies/mL). A negative result must be combined with clinical observations, patient history, and epidemiological information. The expected result is  Negative.  Fact Sheet for Patients:  PinkCheek.be  Fact Sheet for Healthcare Providers:  GravelBags.it  This test is no t yet approved or cleared by the [place] FDA and  has been authorized for detection and/or diagnosis of SARS-CoV-2 by FDA under an Emergency Use Authorization (EUA). This EUA will remain  in effect (meaning this test can be used) for the duration of the COVID-19 declaration under Section 564(b)(1) of the Act, 21 U.S.C. section 360bbb-3(b)(1), unless the authorization is terminated or revoked sooner.     Influenza A by PCR NEGATIVE NEGATIVE Final   Influenza B by PCR NEGATIVE NEGATIVE Final    Comment: (NOTE) The Xpert Xpress SARS-CoV-2/FLU/RSV assay is intended as an aid in  the diagnosis of influenza from Nasopharyngeal swab specimens and  should not be used as a sole basis for treatment. Nasal washings and  aspirates are unacceptable for Xpert Xpress SARS-CoV-2/FLU/RSV  testing.  Fact Sheet for Patients: PinkCheek.be  Fact Sheet for Healthcare Providers: GravelBags.it  This test is not yet approved or cleared by the [place] FDA and  has been authorized for detection and/or diagnosis of SARS-CoV-2 by  FDA under an Emergency Use Authorization (EUA). This EUA will remain  in effect (meaning this test can be used) for the duration of the  Covid-19 declaration under Section 564(b)(1) of the Act, 21  U.S.C. section 360bbb-3(b)(1), unless the authorization is  terminated or revoked. Performed at [facility], [address],  [place], [place] [address]      Time coordinating discharge: Over 30 minutes  SIGNED:   Sidney Ace, MD  [facility] 01/06/2020, 2:13 PM Pager   If 7PM-7AM, please contact night-coverage

## 2020-01-06 NOTE — Progress Notes (Signed)
Pt stated she does not use CPAP and does not wish to use one today.

## 2020-01-06 NOTE — Telephone Encounter (Signed)
Name of Medication:oxycodone 10 mg Name of Pharmacy:[facility] [address] Last Fill or Written Date and Quantity:12-08-19 #120 Last Office Visit and Type:12-19-19 Next Office Visit and Type:No OV Scheduled Last Controlled Substance Agreement Date:05/23/19 Last UDS:05/14/19

## 2020-01-06 NOTE — TOC Transition Note (Signed)
Transition of Care [facility]) - CM/SW Discharge Note   Patient Details  Name: Tina Pacheco MRN: [id number] Date of Birth: 12/25/1950  [facility] of [facility] CM/SW Contact:  Candie Chroman, LCSW Phone Number: 01/06/2020, 2:18 PM   Clinical Narrative:  Patient has orders to discharge home today. No further concerns. CSW signing off.   Final next level of care: Home/Self Care Barriers to Discharge: Barriers Resolved   Patient Goals and CMS Choice     Choice offered to / list presented to : NA  Discharge Placement                    Patient and family notified of of transfer: 01/06/20  Discharge Plan and Services     Post Acute Care Choice: NA                               Social Determinants of Health (SDOH) Interventions     Readmission Risk Interventions No flowsheet data found.

## 2020-01-06 NOTE — Progress Notes (Signed)
Called report to [facility], RN

## 2020-01-06 NOTE — Progress Notes (Addendum)
°  Heart Failure Nurse Navigator Note  HFpEF 01-75%, grade I Diastolic Dysfunction.  Normal right ventricular function.  Mild to moderate aortic sclerosis.  She presented with worsening SOB for 3 days, had not taken her diuretic due do frequent urination and incontenience.  Co morbidities:  Paroxsymal atrial fibrillation RA/OA  Gout Depression Hyperlipidemia Morbid obesity OSA Hypertension   Labs;  Sodium 141, potassium 3.8, BUN 9, creatinine 0.8, troponin 5, BNP 283, albumin 3.2, AST 20, ALT 13.  BMI 64.5 Weight 165.2 kg admission weight was 165.1- she put out 5 liters after admission.  Intake 300 ml Output 5400 ml  Reds clip vest reading was completed on this patient, but her BMI is greater than the upper limit of 38. Her BMI is 64.5.  Assessment:  General- she is awake and alert, no acute distress noted.    HEENT- pupils are equal, non icterus.  Cardiac- heart tones are regular.  Chest- lungs clear.  Abdomen- soft, rounded non tender  Musculoskeletal - 2= pitting edema.  Neuro- moves all exrtremities, speech is clear.  Psych- is pleasant and appropriate   Initial visit with patient.  Discussed the type of heart failure she has  But she states that she does not have chest pain, explained that does not necessarily a sign of heart failure.    Discussed the importance of weighing daily and recording.  She does not weigh herself as she weighs more than the scale limit.  She rejected the idea of getting a different scale that would accurately weigh her.  She does not check her blood pressure on a daily basis, encouraged to do so.  She does not follow a low sodium diet.  Explained the relationship between sodium and fluids.  Also discussed the importance of a fluid restriction, no more than 64 ounces in the 24 hour period.  Also explained to her if she is taking a diuretic it defeats the purpose of the medication to remove extra fluid to take in more  than  allotted. She states he husband does most of the cooking and does cook with salt, she does add it at the table.  She states when he does not feel like cooking that they eat Lean Cuisine.  Discussed wearing compression stocking she states she has tried in the past but are too hard to put on.  Asked her to look into the compression stockings that have zippers.  Instructed she can find on line. They would facilitate moving the fluid from her legs to her abdomen.  She voices understanding.  She states at home she sleeps in recliner but it is not due to PND or orthopnea but is more comfortable with her RA.  Also discussed the importance of her following with Otila Kluver NP in the heart failure clinic.  She was given heart failure teaching booklet along with zone magnet and was discussed.  Pricilla Riffle RN, CHFN

## 2020-01-06 NOTE — Progress Notes (Signed)
   01/06/20 0118  ReDS Vest / Clip  BMI (Calculated) 64.53  Station Marker B  Ruler Value 33  ReDS Value Range (!) > 40  ReDS Actual Value 47

## 2020-01-06 NOTE — Telephone Encounter (Signed)
Pt called needs a refill on her oxycodone. She was triaged and was told that she needed to go to ER and she went and she is being discharged today.

## 2020-01-07 ENCOUNTER — Telehealth: Payer: Self-pay

## 2020-01-07 NOTE — Telephone Encounter (Signed)
Pt wants nystatin powder. I told her about Medicare Part D not covering the powder sometimes. But, that it is relatively cheap with a GoodRx card. I can send it to [facility] for her if it is too expensive at [facility].  Will get approval from Dr Silvio Pate.  The nebulizer solution is albuterol 0.83%

## 2020-01-07 NOTE — Telephone Encounter (Signed)
Okay to Rx ketoconazole cream 2% #60gm x 1 Use bid prn  Find out what medication she needs for nebulizer

## 2020-01-07 NOTE — Telephone Encounter (Signed)
Transition Care Management Follow-up Telephone Call  Date of discharge and from where: 01/06/2020, [facility]  How have you been since you were released from the hospital? Patient is doing fine since her discharge home from the hospital.   Any questions or concerns? Yes, Patient states that she needs a referral to urology. Patient needs some breathing medication prescribed for her nebulizer. Patient also needs some cream prescribed for the yeast infection on her legs.  Items Reviewed:  Did the pt receive and understand the discharge instructions provided? Yes   Medications obtained and verified? Yes   Other? No   Any new allergies since your discharge? No   Dietary orders reviewed? Yes  Do you have support at home? Yes   Home Care and Equipment/Supplies: Were home health services ordered? not applicable If so, what is the name of the agency? N/A  Has the agency set up a time to come to the patient's home? not applicable Were any new equipment or medical supplies ordered?  No What is the name of the medical supply agency? N/A Were you able to get the supplies/equipment? not applicable Do you have any questions related to the use of the equipment or supplies? No  Functional Questionnaire: (I = Independent and D = Dependent) ADLs: I  Bathing/Dressing- I  Meal Prep- I  Eating- I  Maintaining continence- I  Transferring/Ambulation- I  Managing Meds- I  Follow up appointments reviewed:   PCP Hospital f/u appt confirmed? Yes  Scheduled to see Dr. Silvio Pate on 01/14/2020 @ 9 am.  [facility] f/u appt confirmed? follow up with cardiology   Are transportation arrangements needed? No   If their condition worsens, is the pt aware to call PCP or go to the Emergency Dept.? Yes  Was the patient provided with contact information for the PCP's office or ED? Yes  Was to pt encouraged to call back with questions or concerns? Yes

## 2020-01-08 ENCOUNTER — Other Ambulatory Visit: Payer: Self-pay | Admitting: *Deleted

## 2020-01-08 MED ORDER — NYSTATIN 100000 UNIT/GM EX POWD: 1.0000 "application " | Freq: Three times a day (TID) | CUTANEOUS | 11 refills | Status: DC

## 2020-01-08 MED ORDER — ALBUTEROL SULFATE (2.5 MG/3ML) 0.083% IN NEBU: 2.5000 mg | INHALATION_SOLUTION | Freq: Four times a day (QID) | RESPIRATORY_TRACT | 1 refills | Status: DC | PRN

## 2020-01-08 NOTE — Telephone Encounter (Signed)
Fine to use the nystatin powder  Okay to send rx for the albuterol as well  Let her know we can discuss the referral at the visit

## 2020-01-08 NOTE — Addendum Note (Signed)
Addended by: Pilar Grammes on: 01/08/2020 06:34 PM   Modules accepted: Orders

## 2020-01-08 NOTE — Patient Outreach (Signed)
[facility]) Care Management  [place]  01/08/2020   Tina Pacheco May 28, 1950 [id number]  Subjective: Successful telephone outreach call to patient. HIPAA identifiers obtained. Patient reports that she was hospitalized 01/05/20 due to fluid overload. She was diuresed and lost over 5 liters of fluid. Patient states that prior to admission she was not taking prescribed Torsemide due to it causing her to urinate too frequently and the incontinence causes the candida irritation in her groin area to become worse and painful. Patient did share that she does feel more motivated about working towards improving her CHF after a nurse came to talk to her in the hospital and gave her CHF education with color zones and action plans. Patient explained that this nurse was able to explain things differently in a way that the patient was more accepting and understood better about her CHF. Patient states that she will start to take her as needed prescribed Torsemide medication daily despite the frequent urination. Her plan is to take it in the morning and then go to the bathroom every 2 hours with or without the urge to urinate to lessen the chances of her being incontinent. Patient also states she will start to do better with decreasing the amount of sodium she consumes and she is discussing this with her husband who is the primary cook. Patient explained that her weight exceeds the limit of what her home scale will read. Nurse offered to send patient a new scale. Per patient she does not want to get a new scale because that would un-motivate her to lose weight and may make her more resistant to managing her health.  This nurse encouraged patient to review the education she received in the hospital closely, to monitor other signs and symptoms of CHF, and to follow the yellow zone action plan of calling her provider. Patient verbalized understanding. Patient does have her follow up appointment  scheduled with her PCP on 01/14/20. She denies any needs after her hospitalization and states she is well supported by her husband Tina Pacheco and her sister. Patient did not have any further questions or concerns today. She did confirm that she has this nurse's contact information and she will call her if needed.   Encounter Medications:  Outpatient Encounter Medications as of 01/08/2020  Medication Sig Note  . torsemide (DEMADEX) 20 MG tablet Take 20 mg by mouth twice daily as needed for swelling (Patient taking differently: Take 20 mg by mouth daily as needed. Take 20 mg by mouth twice daily as needed for swelling) 01/08/2020: Patient states she is now taking daily since her hospitalization  . amLODipine (NORVASC) 5 MG tablet TAKE 1 TABLET BY MOUTH EVERY DAY IN THE EVENING (Patient taking differently: Take 5 mg by mouth daily. TAKE 1 TABLET BY MOUTH EVERY DAY IN THE EVENING)   . atorvastatin (LIPITOR) 20 MG tablet TAKE 1 TABLET DAILY AT 6PM (Patient taking differently: Take 20 mg by mouth daily. TAKE 1 TABLET DAILY AT 6PM)   . diphenhydrAMINE (BENADRYL) 25 MG tablet Take 25 mg by mouth daily as needed for allergies.    . folic acid (FOLVITE) 1 MG tablet Take 1 mg by mouth daily. (Patient not taking: Reported on 01/05/2020)   . gabapentin (NEURONTIN) 300 MG capsule Take 1 capsule 3 times a day (Take 1 capsule 2-3 hours before bedtime) (Patient taking differently: Take 300 mg by mouth 3 (three) times daily. Take 1 capsule 3 times a day (Take 1 capsule 2-3  hours before bedtime))   . hydroxychloroquine (PLAQUENIL) 200 MG tablet Take 200 mg by mouth 2 (two) times daily.    Marland Kitchen ketoconazole (NIZORAL) 2 % cream Apply 1 application topically 2 (two) times daily.   Marland Kitchen leflunomide (ARAVA) 20 MG tablet Take 20 mg by mouth every morning.    . meclizine (ANTIVERT) 25 MG tablet TAKE 1 TABLET(25 MG) BY MOUTH THREE TIMES DAILY AS NEEDED FOR DIZZINESS (Patient taking differently: Take 25 mg by mouth 3 (three) times daily as  needed for dizziness. )   . metoprolol succinate (TOPROL-XL) 50 MG 24 hr tablet TAKE 1 TABLET BY MOUTH EVERY DAY WITH OR IMMEDIATELY FOLLOWING A MEAL (Patient taking differently: Take 50 mg by mouth at bedtime. )   . montelukast (SINGULAIR) 10 MG tablet TAKE 1 TABLET BY MOUTH AT BEDTIME (Patient taking differently: Take 10 mg by mouth at bedtime. )   . Multiple Vitamin (MULTIVITAMIN) tablet Take 1 tablet by mouth daily.   (Patient not taking: Reported on 01/05/2020)   . Oxycodone HCl 20 MG TABS Take 1 tablet (20 mg total) by mouth in the morning and at bedtime. Substitute for prev rx   . polyethylene glycol (MIRALAX / GLYCOLAX) packet Take 17 g by mouth daily as needed for mild constipation.   . potassium chloride SA (KLOR-CON) 20 MEQ tablet TAKE 1/2 TABLET BY MOUTH TWICE A DAY(MAY TAKE AN EXTRA TABLET WHEN TAKING TORSEMIDE) (Patient taking differently: Take 10 mEq by mouth 2 (two) times daily. TAKE 1/2 TABLET BY MOUTH TWICE A DAY(MAY TAKE AN EXTRA TABLET WHEN TAKING TORSEMIDE))   . PRADAXA 150 MG CAPS capsule TAKE 1 CAPSULE TWICE DAILY (Patient taking differently: Take 150 mg by mouth 2 (two) times daily. )   . SF 5000 PLUS 1.1 % CREA dental cream Place 1 application onto teeth 2 (two) times daily.   . sodium chloride (OCEAN) 0.65 % SOLN nasal spray Place 1 spray into both nostrils as needed for congestion.   Marland Kitchen terazosin (HYTRIN) 10 MG capsule TAKE 1 CAPSULE(10 MG) BY MOUTH AT BEDTIME (Patient taking differently: Take 10 mg by mouth at bedtime. )   . tolterodine (DETROL LA) 2 MG 24 hr capsule TAKE 1 CAPSULE(2 MG) BY MOUTH DAILY AS NEEDED FOR BLADDER SPASMS (Patient taking differently: Take 2 mg by mouth daily as needed (bladder spasms). )    No facility-administered encounter medications on file as of 01/08/2020.    Functional Status:  In your present state of health, do you have any difficulty performing the following activities: 01/06/2020 04/16/2019  Hearing? N N  Vision? N N  Difficulty  concentrating or making decisions? N N  Walking or climbing stairs? Y Y  Dressing or bathing? N N  Doing errands, shopping? Y Y  Comment - Husband provides Copywriter, advertising and eating ? - N  Using the Toilet? - N  In the past six months, have you accidently leaked urine? - Y  Comment - related to diuretic use  Do you have problems with loss of bowel control? - N  Managing your Medications? - N  Managing your Finances? - N  Comment - Family assists as indicated  Housekeeping or managing your Housekeeping? - N  Comment - Family assists as indicated  Some recent data might be hidden    Fall/Depression Screening: Fall Risk  01/08/2020 11/27/2019 11/27/2019  Falls in the past year? 1 1 1   Comment - - -  Number falls in past yr: 1 1  1  Injury with Fall? 0 0 0  Risk Factor Category  - - -  Risk for fall due to : Impaired mobility;Impaired balance/gait;History of fall(s) Impaired mobility;Impaired balance/gait;History of fall(s) Impaired mobility;Impaired balance/gait;History of fall(s)  Risk for fall due to: Comment - - -  Follow up Falls prevention discussed;Education provided;Falls evaluation completed Falls prevention discussed;Education provided;Falls evaluation completed Falls prevention discussed;Education provided;Falls evaluation completed   PHQ 2/9 Scores 11/27/2019 08/28/2019 07/07/2019 04/16/2019 03/20/2018 02/15/2017 11/16/2016  PHQ - 2 Score 3 1 1  0 - 0 0  PHQ- 9 Score 11 - - - - 0 -  Exception Documentation - - - - Medical reason - -    Assessment:  Goals Addressed   None    Plan: RN Health Coach will send PCP today's assessment note, will call patient within the month of December and patient agrees to future outreach calls.   Emelia Loron RN, BSN [place] [phone] [email or web address]

## 2020-01-08 NOTE — Telephone Encounter (Signed)
Rxs sent electronically.  

## 2020-01-14 ENCOUNTER — Other Ambulatory Visit: Payer: Self-pay

## 2020-01-14 ENCOUNTER — Encounter: Payer: Self-pay | Admitting: Internal Medicine

## 2020-01-14 ENCOUNTER — Ambulatory Visit (INDEPENDENT_AMBULATORY_CARE_PROVIDER_SITE_OTHER): Payer: Medicare Other | Admitting: Internal Medicine

## 2020-01-14 DIAGNOSIS — B372 Candidiasis of skin and nail: Secondary | ICD-10-CM | POA: Diagnosis not present

## 2020-01-14 DIAGNOSIS — I5032 Chronic diastolic (congestive) heart failure: Secondary | ICD-10-CM

## 2020-01-14 DIAGNOSIS — N3941 Urge incontinence: Secondary | ICD-10-CM | POA: Diagnosis not present

## 2020-01-14 DIAGNOSIS — J449 Chronic obstructive pulmonary disease, unspecified: Secondary | ICD-10-CM | POA: Diagnosis not present

## 2020-01-14 MED ORDER — TORSEMIDE 20 MG PO TABS
40.0000 mg | ORAL_TABLET | Freq: Every day | ORAL | 3 refills | Status: DC
Start: 2020-01-14 — End: 2020-05-24

## 2020-01-14 MED ORDER — SPIRONOLACTONE 25 MG PO TABS: 25.0000 mg | ORAL_TABLET | Freq: Every day | ORAL | 3 refills | Status: DC

## 2020-01-14 MED ORDER — FLUCONAZOLE 100 MG PO TABS: 100.0000 mg | ORAL_TABLET | Freq: Every day | ORAL | 3 refills | Status: DC

## 2020-01-14 NOTE — Assessment & Plan Note (Addendum)
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

## 2020-01-14 NOTE — Progress Notes (Signed)
Subjective:    Patient ID: Tina Pacheco, female    DOB: 14-Jun-1950, 69 y.o.   MRN: [id number]  HPI Here for hospital follow up This visit occurred during the SARS-CoV-2 public health emergency.  Safety protocols were in place, including screening questions prior to the visit, additional usage of staff PPE, and extensive cleaning of exam room while observing appropriate contact time as indicated for disinfecting solutions.   Reviewed hospital records, echocardiogram and discharge summary  She went to ER due to trouble breathing--after calling here Diagnosed with CHF Did diurese over 5 liters quickly with IV  Still has DOE---just walking or putting on her shoes  Taking the torsemide daily since discharge Still holding all the weight/fluid  Bad rash under pannus---"feels like sunburn" Using nystatin powder---helps but doesn't resolve (hard to keep it there)  No chest pain (other than right side pain from old rib injury) No racing heart---may be aware of it "working harder" Clear edema in legs---especially on right Sleeps in chair Only occasional PND---vague SOB feeling ("panic thing"----better if sits up)  Has cardiologist---Dr Rockey Situ Not sure of next appointment  Current Outpatient Medications on File Prior to Visit  Medication Sig Dispense Refill  . albuterol (PROVENTIL) (2.5 MG/3ML) 0.083% nebulizer solution Take 3 mLs (2.5 mg total) by nebulization every 6 (six) hours as needed for wheezing or shortness of breath. 150 mL 1  . amLODipine (NORVASC) 5 MG tablet TAKE 1 TABLET BY MOUTH EVERY DAY IN THE EVENING (Patient taking differently: Take 5 mg by mouth daily. TAKE 1 TABLET BY MOUTH EVERY DAY IN THE EVENING) 90 tablet 3  . atorvastatin (LIPITOR) 20 MG tablet TAKE 1 TABLET DAILY AT 6PM (Patient taking differently: Take 20 mg by mouth daily. TAKE 1 TABLET DAILY AT 6PM) 90 tablet 3  . diphenhydrAMINE (BENADRYL) 25 MG tablet Take 25 mg by mouth daily as needed for allergies.       Marland Kitchen gabapentin (NEURONTIN) 300 MG capsule Take 1 capsule 3 times a day (Take 1 capsule 2-3 hours before bedtime) (Patient taking differently: Take 300 mg by mouth 3 (three) times daily. Take 1 capsule 3 times a day (Take 1 capsule 2-3 hours before bedtime)) 90 capsule 11  . hydroxychloroquine (PLAQUENIL) 200 MG tablet Take 200 mg by mouth 2 (two) times daily.     Marland Kitchen ketoconazole (NIZORAL) 2 % cream Apply 1 application topically 2 (two) times daily.    Marland Kitchen leflunomide (ARAVA) 20 MG tablet Take 20 mg by mouth every morning.     . meclizine (ANTIVERT) 25 MG tablet TAKE 1 TABLET(25 MG) BY MOUTH THREE TIMES DAILY AS NEEDED FOR DIZZINESS (Patient taking differently: Take 25 mg by mouth 3 (three) times daily as needed for dizziness. ) 90 tablet 5  . metoprolol succinate (TOPROL-XL) 50 MG 24 hr tablet TAKE 1 TABLET BY MOUTH EVERY DAY WITH OR IMMEDIATELY FOLLOWING A MEAL (Patient taking differently: Take 50 mg by mouth at bedtime. ) 90 tablet 3  . montelukast (SINGULAIR) 10 MG tablet TAKE 1 TABLET BY MOUTH AT BEDTIME (Patient taking differently: Take 10 mg by mouth at bedtime. ) 90 tablet 3  . Multiple Vitamin (MULTIVITAMIN) tablet Take 1 tablet by mouth daily.     Marland Kitchen nystatin (MYCOSTATIN/NYSTOP) powder Apply 1 application topically 3 (three) times daily. 90 g 11  . Oxycodone HCl 20 MG TABS Take 1 tablet (20 mg total) by mouth in the morning and at bedtime. Substitute for prev rx 60 tablet 0  .  polyethylene glycol (MIRALAX / GLYCOLAX) packet Take 17 g by mouth daily as needed for mild constipation. 14 each 0  . potassium chloride SA (KLOR-CON) 20 MEQ tablet TAKE 1/2 TABLET BY MOUTH TWICE A DAY(MAY TAKE AN EXTRA TABLET WHEN TAKING TORSEMIDE) (Patient taking differently: Take 10 mEq by mouth 2 (two) times daily. TAKE 1/2 TABLET BY MOUTH TWICE A DAY(MAY TAKE AN EXTRA TABLET WHEN TAKING TORSEMIDE)) 180 tablet 3  . PRADAXA 150 MG CAPS capsule TAKE 1 CAPSULE TWICE DAILY (Patient taking differently: Take 150 mg by mouth 2  (two) times daily. ) 180 capsule 0  . SF 5000 PLUS 1.1 % CREA dental cream Place 1 application onto teeth 2 (two) times daily.  1  . sodium chloride (OCEAN) 0.65 % SOLN nasal spray Place 1 spray into both nostrils as needed for congestion.    Marland Kitchen terazosin (HYTRIN) 10 MG capsule TAKE 1 CAPSULE(10 MG) BY MOUTH AT BEDTIME (Patient taking differently: Take 10 mg by mouth at bedtime. ) 90 capsule 3  . tolterodine (DETROL LA) 2 MG 24 hr capsule TAKE 1 CAPSULE(2 MG) BY MOUTH DAILY AS NEEDED FOR BLADDER SPASMS (Patient taking differently: Take 2 mg by mouth daily as needed (bladder spasms). ) 90 capsule 3  . torsemide (DEMADEX) 20 MG tablet Take 20 mg by mouth twice daily as needed for swelling (Patient taking differently: Take 20 mg by mouth daily as needed. Take 20 mg by mouth twice daily as needed for swelling) 503 tablet 3  . folic acid (FOLVITE) 1 MG tablet Take 1 mg by mouth daily.  (Patient not taking: Reported on 01/14/2020)     No current facility-administered medications on file prior to visit.    Allergies  Allergen Reactions  . Fluoxetine     Headache, shaking, sleep issues  . Codeine     Nausea and vomiting/only when taking too much    Past Medical History:  Diagnosis Date  . Acute CHF (congestive heart failure) ([place]) 03/30/2019  . Arthritis    RA  . Asthma   . Basal cell carcinoma   . Cataract 2018   bilateral eyes; corrected with surgery  . Claustrophobia   . Collagen vascular disease (HCC)    RA  . COPD (chronic obstructive pulmonary disease) ([place])   . Diastolic dysfunction    a. echo 07/2014: EF 55-60%, no RWMA, GR2DD, mild MR, LA moderately dilated, PASP 38 mm Hg  . Dyslipidemia   . Dysrhythmia   . Headache    migraines  . Hemihypertrophy   . History of cardiac cath    a. 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 History of gout   . Hyperplastic colonic polyp 2003  . Hypertension   . Hypokalemia   . Morbid obesity ([place])   . PAF (paroxysmal atrial  fibrillation) (HCC)    a. on Pradaxa; b. CHADSVASc at least 2 (HTN & female)  . Rheumatoid arthritis(714.0)   . Sleep apnea    a. not compliant with CPAP    Past Surgical History:  Procedure Laterality Date  . ABDOMINAL HYSTERECTOMY    . APPLICATION OF WOUND VAC Right 08/09/2017   Procedure: APPLICATION OF WOUND VAC;  Surgeon: Albertine Patricia, DPM;  Location: [facility] ORS;  Service: Podiatry;  Laterality: Right;  . CARDIAC CATHETERIZATION  05/24/2010   nonobstructive CAD  . CATARACT EXTRACTION W/ INTRAOCULAR LENS IMPLANT Right 06/12/2016   Dr. Darleen Crocker  . CATARACT EXTRACTION W/ INTRAOCULAR LENS IMPLANT Left 06/26/2016   Dr.  Darleen Crocker  . CESAREAN SECTION    . CHOLECYSTECTOMY    . COLONOSCOPY  06/12/2011   Procedure: COLONOSCOPY;  Surgeon: Juanita Craver, MD;  Location: [facility];  Service: Endoscopy;  Laterality: N/A;  . COLONOSCOPY N/A 03/17/2013   Procedure: COLONOSCOPY;  Surgeon: Juanita Craver, MD;  Location: [facility]OS[facility];  Service: Endoscopy;  Laterality: N/A;  . EYE SURGERY    . FOOT ARTHRODESIS Right 07/13/2017   Procedure: ARTHRODESIS FOOT-MULTI.FUSIONS (6 JOINTS);  Surgeon: Albertine Patricia, DPM;  Location: [facility] ORS;  Service: Podiatry;  Laterality: Right;  . IRRIGATION AND DEBRIDEMENT FOOT Right 08/09/2017   Procedure: IRRIGATION AND DEBRIDEMENT FOOT;  Surgeon: Albertine Patricia, DPM;  Location: [facility] ORS;  Service: Podiatry;  Laterality: Right;  . KNEE ARTHROSCOPY     bilateral  . SINUSOTOMY    . TOOTH EXTRACTION  12/2016  . VAGINAL HYSTERECTOMY      Family History  Problem Relation Age of Onset  . Emphysema Father        smoked  . Tuberculosis Mother   . Parkinsonism Mother   . Diabetes type II Sister   . Breast cancer Sister   . Breast cancer Maternal Aunt   . Tuberculosis Sister     Social History   Socioeconomic History  . Marital status: Married    Spouse name: Not on file  . Number of children: 1  . Years of education: Not on file  . Highest education  level: Not on file  Occupational History  . 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 Smoking status: Never Smoker  . Smokeless tobacco: Never Used  Vaping Use  . Vaping Use: Never used  Substance and Sexual Activity  . Alcohol use: No  . Drug use: No  . Sexual activity: Not Currently  Other Topics Concern  . Not on file  Social History Narrative   No living will   Husband, then daughter should be decision maker   Would accept resuscitation attempts   Not sure about tube feeds   Social Determinants of Health   Financial Resource Strain:   . Difficulty of Paying Living Expenses: Not on file  Food Insecurity: No Food Insecurity  . Worried About Charity fundraiser in the Last Year: Never true  . Ran Out of Food in the Last Year: Never true  Transportation Needs: No Transportation Needs  . Lack of Transportation (Medical): No  . Lack of Transportation (Non-Medical): No  Physical Activity:   . Days of Exercise per Week: Not on file  . Minutes of Exercise per Session: Not on file  Stress:   . Feeling of Stress : Not on file  Social Connections:   . Frequency of Communication with Friends and Family: Not on file  . Frequency of Social Gatherings with Friends and Family: Not on file  . Attends Religious Services: Not on file  . Active Member of Clubs or Organizations: Not on file  . Attends Archivist Meetings: Not on file  . Marital Status: Not on file  Intimate Partner Violence:   . Fear of Current or Ex-Partner: Not on file  . Emotionally Abused: Not on file  . Physically Abused: Not on file  . Sexually Abused: Not on file   Review of Systems Appetite is not good--drinking lots of fluids (but not hungry) Has cut out salt---husband does the cooking Having urgency, frequency and some incontinence without sensation---wondering about  urologist. Not taking detrol now    Objective:   Physical Exam Constitutional:        Appearance: She is obese.  Cardiovascular:     Rate and Rhythm: Normal rate and regular rhythm.     Heart sounds: No murmur heard.  No gallop.   Pulmonary:     Effort: Pulmonary effort is normal.     Breath sounds: No wheezing or rales.     Comments: Muffled breath sounds but clear Abdominal:     Palpations: Abdomen is soft.     Tenderness: There is no abdominal tenderness.     Comments: Marked intertrigo under large abdominal pannus  Musculoskeletal:     Comments: 4+ non pitting edema in legs  Lymphadenopathy:     Cervical: No cervical adenopathy.  Neurological:     Mental Status: She is alert.  Psychiatric:     Comments: Frustrated but not overtly depressed            Assessment & Plan:

## 2020-01-14 NOTE — Assessment & Plan Note (Signed)
Will add oral fluconazole Hopefully will improve if we can get the fluid off

## 2020-01-14 NOTE — Patient Instructions (Signed)
Please double the torsemide to 40mg  (two of the 20mg  tabs) every morning and add the spironolactone every morning as well.

## 2020-01-14 NOTE — Assessment & Plan Note (Signed)
This will not improve until her fluid status is corrected Will hold off on urologist for now Not on detrol now--best to hold off on Rx now

## 2020-01-14 NOTE — Assessment & Plan Note (Signed)
Has albuterol for nebs but mostly her dyspnea seems fluid related

## 2020-01-16 NOTE — Progress Notes (Signed)
Patient ID: Tina Pacheco, female    DOB: 27-Aug-1950, 69 y.o.   MRN: [id number]  HPI  Tina Pacheco is a 69 y/o female with a history of asthma, HTN, dyslipidemia, PAF, rhaumatoid arthritis, depression, COPD, sleep apnea, claustrophobia and chronic heart failure.   Echo report from 01/06/20 reviewed and showed an EF of 60-65% along with mld LVH.  Admitted 01/05/20 due to HF exacerbation after not taking her diuretics for previous 3 days. Initially given IV lasix with transition to oral diuretics with subsequent 5.3L loss of fluids. Discharged the following day.   She presents today for her initial visit with a chief complaint of moderate fatigue upon little exertion. She describes this as chronic in nature having been present for several years. She has associated head congestion, shortness of breath, dizziness, headaches, pedal edema and chronic back pain along with this. She denies any difficulty sleeping, abdominal distention, palpitations, chest pain or cough.   Has been drinking powerade along with water and says that she's been told to drink ~ 36 ounces of water but admits that it's hard to stick to that.   Has scales at home but they don't go up to 350 pounds so she hasn't been weighing daily. She recently had spironolactone added along with increase in her torsemide. Having to wear depends due to urgency. Continues to have a rash along abdominal folds that just isn't getting any better and is quite irritating.   Past Medical History:  Diagnosis Date  . Acute CHF (congestive heart failure) ([place]) 03/30/2019  . Arthritis    RA  . Asthma   . Basal cell carcinoma   . Cataract 2018   bilateral eyes; corrected with surgery  . Claustrophobia   . Collagen vascular disease (HCC)    RA  . COPD (chronic obstructive pulmonary disease) ([place])   . Diastolic dysfunction    a. echo 07/2014: EF 55-60%, no RWMA, GR2DD, mild MR, LA moderately dilated, PASP 38 mm Hg  . Dyslipidemia   . Dysrhythmia   .  Headache    migraines  . Hemihypertrophy   . History of cardiac cath    a. 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 History of gout   . Hyperplastic colonic polyp 2003  . Hypertension   . Hypokalemia   . Morbid obesity ([place])   . PAF (paroxysmal atrial fibrillation) (HCC)    a. on Pradaxa; b. CHADSVASc at least 2 (HTN & female)  . Rheumatoid arthritis(714.0)   . Sleep apnea    a. not compliant with CPAP   Past Surgical History:  Procedure Laterality Date  . ABDOMINAL HYSTERECTOMY    . APPLICATION OF WOUND VAC Right 08/09/2017   Procedure: APPLICATION OF WOUND VAC;  Surgeon: Albertine Patricia, DPM;  Location: [facility];  Service: Podiatry;  Laterality: Right;  . CARDIAC CATHETERIZATION  05/24/2010   nonobstructive CAD  . CATARACT EXTRACTION W/ INTRAOCULAR LENS IMPLANT Right 06/12/2016   Dr. Darleen Crocker  . CATARACT EXTRACTION W/ INTRAOCULAR LENS IMPLANT Left 06/26/2016   Dr. Darleen Crocker  . CESAREAN SECTION    . CHOLECYSTECTOMY    . COLONOSCOPY  06/12/2011   Procedure: COLONOSCOPY;  Surgeon: Juanita Craver, MD;  Location: [facility];  Service: Endoscopy;  Laterality: N/A;  . COLONOSCOPY N/A 03/17/2013   Procedure: COLONOSCOPY;  Surgeon: Juanita Craver, MD;  Location: [facility];  Service: Endoscopy;  Laterality: N/A;  . EYE SURGERY    . FOOT ARTHRODESIS Right 07/13/2017   Procedure:  ARTHRODESIS FOOT-MULTI.FUSIONS (6 JOINTS);  Surgeon: Albertine Patricia, DPM;  Location: [facility];  Service: Podiatry;  Laterality: Right;  . IRRIGATION AND DEBRIDEMENT FOOT Right 08/09/2017   Procedure: IRRIGATION AND DEBRIDEMENT FOOT;  Surgeon: Albertine Patricia, DPM;  Location: [facility];  Service: Podiatry;  Laterality: Right;  . KNEE ARTHROSCOPY     bilateral  . SINUSOTOMY    . TOOTH EXTRACTION  12/2016  . VAGINAL HYSTERECTOMY     Family History  Problem Relation Age of Onset  . Emphysema Father        smoked  . Tuberculosis Mother   . Parkinsonism Mother   . Diabetes type II Sister   .  Breast cancer Sister   . Breast cancer Maternal Aunt   . Tuberculosis Sister    Social History   Tobacco Use  . Smoking status: Never Smoker  . Smokeless tobacco: Never Used  Substance Use Topics  . Alcohol use: No   Allergies  Allergen Reactions  . Fluoxetine     Headache, shaking, sleep issues  . Codeine     Nausea and vomiting/only when taking too much   Prior to Admission medications   Medication Sig Start Date End Date Taking? Authorizing Provider  albuterol (PROVENTIL) (2.5 MG/3ML) 0.083% nebulizer solution Take 3 mLs (2.5 mg total) by nebulization every 6 (six) hours as needed for wheezing or shortness of breath. 01/08/20  Yes Viviana Simpler I, MD  amLODipine (NORVASC) 5 MG tablet TAKE 1 TABLET BY MOUTH EVERY DAY IN THE EVENING Patient taking differently: Take 5 mg by mouth daily. TAKE 1 TABLET BY MOUTH EVERY DAY IN THE EVENING 09/04/19  Yes Venia Carbon, MD  atorvastatin (LIPITOR) 20 MG tablet TAKE 1 TABLET DAILY AT 6PM Patient taking differently: Take 20 mg by mouth daily. TAKE 1 TABLET DAILY AT 6PM 12/03/18  Yes Venia Carbon, MD  fluconazole (DIFLUCAN) 100 MG tablet Take 1 tablet (100 mg total) by mouth daily. Repeat every few weeks as needed 01/14/20  Yes Venia Carbon, MD  folic acid (FOLVITE) 1 MG tablet Take 1 mg by mouth daily.    Yes [provider]  gabapentin (NEURONTIN) 300 MG capsule Take 1 capsule 3 times a day (Take 1 capsule 2-3 hours before bedtime) Patient taking differently: Take 300 mg by mouth 3 (three) times daily. Take 1 capsule 3 times a day (Take 1 capsule 2-3 hours before bedtime) 01/15/19  Yes Venia Carbon, MD  hydroxychloroquine (PLAQUENIL) 200 MG tablet Take 200 mg by mouth 2 (two) times daily.    Yes [provider]  ketoconazole (NIZORAL) 2 % cream Apply 1 application topically 2 (two) times daily. 12/30/19  Yes [provider]  leflunomide (ARAVA) 20 MG tablet Take 20 mg by mouth every morning.  09/20/11   Yes [provider]  meclizine (ANTIVERT) 25 MG tablet TAKE 1 TABLET(25 MG) BY MOUTH THREE TIMES DAILY AS NEEDED FOR DIZZINESS Patient taking differently: Take 25 mg by mouth 3 (three) times daily as needed for dizziness.  10/20/19  Yes Venia Carbon, MD  metoprolol succinate (TOPROL-XL) 50 MG 24 hr tablet TAKE 1 TABLET BY MOUTH EVERY DAY WITH OR IMMEDIATELY FOLLOWING A MEAL Patient taking differently: Take 50 mg by mouth at bedtime.  01/16/19  Yes Venia Carbon, MD  montelukast (SINGULAIR) 10 MG tablet TAKE 1 TABLET BY MOUTH AT BEDTIME Patient taking differently: Take 10 mg by mouth at bedtime.  02/07/19  Yes Venia Carbon, MD  Multiple Vitamin (MULTIVITAMIN) tablet Take 1 tablet by mouth daily.    Yes [provider]  nystatin (MYCOSTATIN/NYSTOP) powder Apply 1 application topically 3 (three) times daily. 01/08/20  Yes Venia Carbon, MD  Oxycodone HCl 20 MG TABS Take 1 tablet (20 mg total) by mouth in the morning and at bedtime. Substitute for prev rx 01/06/20  Yes Venia Carbon, MD  polyethylene glycol (MIRALAX / GLYCOLAX) packet Take 17 g by mouth daily as needed for mild constipation. 07/16/17  Yes Sainani, Belia Heman, MD  potassium chloride SA (KLOR-CON) 20 MEQ tablet TAKE 1/2 TABLET BY MOUTH TWICE A DAY(MAY TAKE AN EXTRA TABLET WHEN TAKING TORSEMIDE) Patient taking differently: Take 10 mEq by mouth 2 (two) times daily. TAKE 1/2 TABLET BY MOUTH TWICE A DAY(MAY TAKE AN EXTRA TABLET WHEN TAKING TORSEMIDE) 06/09/19  Yes Walker, Martie Lee, NP  PRADAXA 150 MG CAPS capsule TAKE 1 CAPSULE TWICE DAILY Patient taking differently: Take 150 mg by mouth 2 (two) times daily.  10/21/19  Yes Gollan, Kathlene November, MD  SF 5000 PLUS 1.1 % CREA dental cream Place 1 application onto teeth 2 (two) times daily. 06/01/17  Yes [provider]  sodium chloride (OCEAN) 0.65 % SOLN nasal spray Place 1 spray into both nostrils as needed for congestion.   Yes [provider]   spironolactone (ALDACTONE) 25 MG tablet Take 1 tablet (25 mg total) by mouth daily. 01/14/20  Yes Venia Carbon, MD  terazosin (HYTRIN) 10 MG capsule TAKE 1 CAPSULE(10 MG) BY MOUTH AT BEDTIME Patient taking differently: Take 10 mg by mouth at bedtime.  01/03/20  Yes Venia Carbon, MD  torsemide (DEMADEX) 20 MG tablet Take 2 tablets (40 mg total) by mouth daily. 01/14/20  Yes Venia Carbon, MD    Review of Systems  Constitutional: Positive for fatigue. Negative for appetite change.  HENT: Positive for congestion. Negative for rhinorrhea and sore throat.   Eyes: Negative.   Respiratory: Positive for shortness of breath. Negative for cough.   Cardiovascular: Positive for leg swelling. Negative for chest pain and palpitations.  Gastrointestinal: Negative for abdominal distention and abdominal pain.  Endocrine: Negative.   Genitourinary: Positive for urgency.  Musculoskeletal: Positive for back pain. Negative for neck pain.  Skin: Positive for rash (folds of abdomen).  Allergic/Immunologic: Negative.   Neurological: Positive for dizziness (at times) and headaches (at times). Negative for light-headedness.  Hematological: Negative for adenopathy. Does not bruise/bleed easily.  Psychiatric/Behavioral: Negative for agitation, dysphoric mood and sleep disturbance.   Vitals:   01/19/20 1355  BP: (!) 150/71  Pulse: 72  Resp: 18  SpO2: 97%  Weight: (!) 349 lb 2 oz (158.4 kg)  Height: 5\' 3"  (1.6 m)   Wt Readings from Last 3 Encounters:  01/19/20 (!) 349 lb 2 oz (158.4 kg)  01/14/20 (!) 363 lb (164.7 kg)  01/06/20 (!) 364 lb 3.2 oz (165.2 kg)   Lab Results  Component Value Date   CREATININE 0.80 01/06/2020   CREATININE 0.70 01/05/2020   CREATININE 0.52 12/09/2019    Physical Exam Vitals and nursing note reviewed.  Constitutional:      Appearance: She is well-developed.  HENT:     Head: Normocephalic and atraumatic.  Neck:     Vascular: No JVD.  Cardiovascular:      Rate and Rhythm: Normal rate and regular rhythm.  Pulmonary:     Effort: Pulmonary effort is normal. No respiratory distress.     Breath sounds: No  wheezing or rales.  Abdominal:     Palpations: Abdomen is soft.     Tenderness: There is no abdominal tenderness.  Musculoskeletal:     Cervical back: Neck supple.     Right lower leg: No tenderness. Edema present.     Left lower leg: Tenderness present. Edema present.  Skin:    General: Skin is warm and dry.  Neurological:     General: No focal deficit present.     Mental Status: She is alert and oriented to person, place, and time.  Psychiatric:        Mood and Affect: Mood normal.        Behavior: Behavior normal.    Assessment & Plan:  1: Chronic heart failure with preserved ejection fraction- - NYHA class III - euvolemic today - not weighing daily because her scale doesn't go up far enough; scales given to her today and instructed to weigh daily and call for an overnight weight gain of >2 pounds or a weekly weight gain of >5 pounds - cautioned about powerade due to sodium content; explained that she could drink ~ 60 ounces of fluid daily - saw cardiology Rockey Situ) 08/25/19 - BNP 01/05/20 was 283.0 - reports receiving her flu vaccine for this season  2: HTN- - BP mildly elevated today - saw PCP Silvio Pate) 01/14/20 with addition of spironolactone and doubling of her torsemide - will check BMP today - BMP from 01/19/20 reviewed and showed sodium 139, potassium 4.5, creatinine 0.93 and GFR >60  3: COPD- - uses nebulizer PRN  4: Lymphedema- - stage 2 - limited in her ability to exercise due to her pain - elevates in her legs when sitting in her recliner - unable to wear support socks because they are too tight and "cut into" her legs - discussed possible referral to lymphedema clinic but she would like to wait until she gets this rash healed up - could consider lymphapress compression boots as well   Patient did not bring her  medications nor a list. Each medication was verbally reviewed with the patient and she was encouraged to bring the bottles to every visit to confirm accuracy of list.  Return in 6 weeks or sooner for any questions/problems before then.

## 2020-01-19 ENCOUNTER — Telehealth: Payer: Self-pay | Admitting: Internal Medicine

## 2020-01-19 ENCOUNTER — Ambulatory Visit: Payer: Medicare Other | Admitting: Family

## 2020-01-19 ENCOUNTER — Other Ambulatory Visit: Payer: Self-pay

## 2020-01-19 ENCOUNTER — Other Ambulatory Visit
Admission: RE | Admit: 2020-01-19 | Discharge: 2020-01-19 | Disposition: A | Discharge status: Home / Self Care | Payer: Medicare Other | Source: Ambulatory Visit | Attending: Family | Admitting: Family

## 2020-01-19 ENCOUNTER — Encounter: Payer: Self-pay | Admitting: Family

## 2020-01-19 VITALS — BP 150/71 | HR 72 | Resp 18 | Ht 63.0 in | Wt 349.1 lb

## 2020-01-19 DIAGNOSIS — I110 Hypertensive heart disease with heart failure: Secondary | ICD-10-CM | POA: Insufficient documentation

## 2020-01-19 DIAGNOSIS — R21 Rash and other nonspecific skin eruption: Secondary | ICD-10-CM | POA: Insufficient documentation

## 2020-01-19 DIAGNOSIS — I890 Lymphedema, not elsewhere classified: Secondary | ICD-10-CM | POA: Insufficient documentation

## 2020-01-19 DIAGNOSIS — Z79899 Other long term (current) drug therapy: Secondary | ICD-10-CM | POA: Insufficient documentation

## 2020-01-19 DIAGNOSIS — J449 Chronic obstructive pulmonary disease, unspecified: Secondary | ICD-10-CM | POA: Insufficient documentation

## 2020-01-19 DIAGNOSIS — I5032 Chronic diastolic (congestive) heart failure: Secondary | ICD-10-CM | POA: Insufficient documentation

## 2020-01-19 DIAGNOSIS — E785 Hyperlipidemia, unspecified: Secondary | ICD-10-CM | POA: Insufficient documentation

## 2020-01-19 DIAGNOSIS — I2510 Atherosclerotic heart disease of native coronary artery without angina pectoris: Secondary | ICD-10-CM | POA: Insufficient documentation

## 2020-01-19 DIAGNOSIS — Z7901 Long term (current) use of anticoagulants: Secondary | ICD-10-CM | POA: Insufficient documentation

## 2020-01-19 DIAGNOSIS — I10 Essential (primary) hypertension: Secondary | ICD-10-CM

## 2020-01-19 LAB — BASIC METABOLIC PANEL
Anion gap: 6 (ref 5–15)
BUN: 11 mg/dL (ref 8–23)
CO2: 30 mmol/L (ref 22–32)
Calcium: 9.6 mg/dL (ref 8.9–10.3)
Chloride: 103 mmol/L (ref 98–111)
Creatinine, Ser: 0.93 mg/dL (ref 0.44–1.00)
GFR, Estimated: >60 mL/min (ref >60)
Glucose, Bld: 104 mg/dL — ABNORMAL HIGH (ref 70–99)
Potassium: 4.5 mmol/L (ref 3.5–5.1)
Sodium: 139 mmol/L (ref 135–145)

## 2020-01-19 NOTE — Telephone Encounter (Signed)
She is established with [facility]. She will call and see if she can get in with them.

## 2020-01-19 NOTE — Telephone Encounter (Signed)
Pt called in due to she is stating that the rash is getting worse and need a different prescription if anything is stronger and its spreading.

## 2020-01-19 NOTE — Patient Instructions (Signed)
Continue weighing daily and call for an overnight weight gain of > 2 pounds or a weekly weight gain of >5 pounds. 

## 2020-01-19 NOTE — Telephone Encounter (Signed)
Left message for pt to call back  °

## 2020-01-19 NOTE — Telephone Encounter (Signed)
I am not sure what to add to the topical and oral fluconazole. I think she probably needs to see a dermatologist---find out if she needs a referral

## 2020-01-20 ENCOUNTER — Telehealth: Payer: Self-pay | Admitting: Internal Medicine

## 2020-01-20 ENCOUNTER — Other Ambulatory Visit: Payer: Self-pay | Admitting: Internal Medicine

## 2020-01-20 NOTE — Telephone Encounter (Signed)
Spoke to pt. She said she has enough to last until she hears from the dermatologist. It is not covered by her insurance. But it is fairly cheap with [employer].

## 2020-01-20 NOTE — Telephone Encounter (Signed)
Pt called in wanted to let Dr.Letvak know she has not been able to reach a dermatologist and she is using Triminolone .01 ointment

## 2020-01-20 NOTE — Telephone Encounter (Signed)
Pt called in [facility]  [place], [place]  [phone]

## 2020-01-25 ENCOUNTER — Other Ambulatory Visit: Payer: Self-pay | Admitting: Internal Medicine

## 2020-01-26 ENCOUNTER — Other Ambulatory Visit: Payer: Self-pay | Admitting: Cardiovascular Disease

## 2020-01-26 MED ORDER — DABIGATRAN ETEXILATE MESYLATE 150 MG PO CAPS
150.0000 mg | ORAL_CAPSULE | Freq: Two times a day (BID) | ORAL | 0 refills | Status: DC
Start: 2020-01-26 — End: 2020-04-26

## 2020-01-26 NOTE — Telephone Encounter (Signed)
Requested Prescriptions   Signed Prescriptions Disp Refills   dabigatran (PRADAXA) 150 MG CAPS capsule 180 capsule 0    Sig: Take 1 capsule (150 mg total) by mouth 2 (two) times daily.    Authorizing Provider: Rise Mu    Ordering User: Britt Bottom

## 2020-01-26 NOTE — Telephone Encounter (Signed)
*  STAT* If patient is at the pharmacy, call can be transferred to refill team.   1. Which medications need to be refilled? (please list name of each medication and dose if known)    Pradaxa 150 mg po BID  2. Which pharmacy/location (including street and city if local pharmacy) is medication to be sent to?  [facility]   3. Do they need a 30 day or 90 day supply? [place]

## 2020-01-28 ENCOUNTER — Observation Stay
Admission: EM | Admit: 2020-01-28 | Discharge: 2020-01-29 | Disposition: A | Discharge status: Home / Self Care | Payer: Medicare Other | Attending: Internal Medicine | Admitting: Internal Medicine

## 2020-01-28 ENCOUNTER — Emergency Department: Payer: Medicare Other

## 2020-01-28 ENCOUNTER — Other Ambulatory Visit: Payer: Self-pay

## 2020-01-28 DIAGNOSIS — I5032 Chronic diastolic (congestive) heart failure: Secondary | ICD-10-CM | POA: Diagnosis not present

## 2020-01-28 DIAGNOSIS — L304 Erythema intertrigo: Secondary | ICD-10-CM | POA: Diagnosis present

## 2020-01-28 DIAGNOSIS — M2550 Pain in unspecified joint: Secondary | ICD-10-CM | POA: Diagnosis not present

## 2020-01-28 DIAGNOSIS — R0902 Hypoxemia: Secondary | ICD-10-CM | POA: Diagnosis not present

## 2020-01-28 DIAGNOSIS — I25110 Atherosclerotic heart disease of native coronary artery with unstable angina pectoris: Secondary | ICD-10-CM | POA: Diagnosis not present

## 2020-01-28 DIAGNOSIS — R5381 Other malaise: Secondary | ICD-10-CM | POA: Diagnosis not present

## 2020-01-28 DIAGNOSIS — Z79899 Other long term (current) drug therapy: Secondary | ICD-10-CM | POA: Insufficient documentation

## 2020-01-28 DIAGNOSIS — Z6841 Body Mass Index (BMI) 40.0 and over, adult: Secondary | ICD-10-CM | POA: Diagnosis present

## 2020-01-28 DIAGNOSIS — G4700 Insomnia, unspecified: Secondary | ICD-10-CM | POA: Diagnosis not present

## 2020-01-28 DIAGNOSIS — M069 Rheumatoid arthritis, unspecified: Secondary | ICD-10-CM | POA: Insufficient documentation

## 2020-01-28 DIAGNOSIS — J811 Chronic pulmonary edema: Secondary | ICD-10-CM | POA: Diagnosis not present

## 2020-01-28 DIAGNOSIS — I4891 Unspecified atrial fibrillation: Secondary | ICD-10-CM | POA: Diagnosis not present

## 2020-01-28 DIAGNOSIS — R0602 Shortness of breath: Secondary | ICD-10-CM | POA: Diagnosis not present

## 2020-01-28 DIAGNOSIS — Z85828 Personal history of other malignant neoplasm of skin: Secondary | ICD-10-CM | POA: Insufficient documentation

## 2020-01-28 DIAGNOSIS — I480 Paroxysmal atrial fibrillation: Secondary | ICD-10-CM | POA: Diagnosis present

## 2020-01-28 DIAGNOSIS — R531 Weakness: Secondary | ICD-10-CM

## 2020-01-28 DIAGNOSIS — I110 Hypertensive heart disease with heart failure: Secondary | ICD-10-CM | POA: Diagnosis not present

## 2020-01-28 DIAGNOSIS — W19XXXA Unspecified fall, initial encounter: Secondary | ICD-10-CM | POA: Insufficient documentation

## 2020-01-28 DIAGNOSIS — E785 Hyperlipidemia, unspecified: Secondary | ICD-10-CM | POA: Insufficient documentation

## 2020-01-28 DIAGNOSIS — J45909 Unspecified asthma, uncomplicated: Secondary | ICD-10-CM | POA: Insufficient documentation

## 2020-01-28 DIAGNOSIS — I10 Essential (primary) hypertension: Secondary | ICD-10-CM | POA: Diagnosis present

## 2020-01-28 DIAGNOSIS — E669 Obesity, unspecified: Secondary | ICD-10-CM | POA: Diagnosis not present

## 2020-01-28 DIAGNOSIS — M549 Dorsalgia, unspecified: Secondary | ICD-10-CM | POA: Diagnosis present

## 2020-01-28 DIAGNOSIS — Z723 Lack of physical exercise: Secondary | ICD-10-CM | POA: Insufficient documentation

## 2020-01-28 DIAGNOSIS — Z20822 Contact with and (suspected) exposure to covid-19: Secondary | ICD-10-CM | POA: Diagnosis not present

## 2020-01-28 DIAGNOSIS — M545 Low back pain, unspecified: Secondary | ICD-10-CM | POA: Diagnosis not present

## 2020-01-28 DIAGNOSIS — I517 Cardiomegaly: Secondary | ICD-10-CM | POA: Diagnosis not present

## 2020-01-28 DIAGNOSIS — J90 Pleural effusion, not elsewhere classified: Secondary | ICD-10-CM | POA: Diagnosis not present

## 2020-01-28 LAB — BASIC METABOLIC PANEL
Anion gap: 12 (ref 5–15)
BUN: 18 mg/dL (ref 8–23)
CO2: 27 mmol/L (ref 22–32)
Calcium: 10 mg/dL (ref 8.9–10.3)
Chloride: 94 mmol/L — ABNORMAL LOW (ref 98–111)
Creatinine, Ser: 1 mg/dL (ref 0.44–1.00)
GFR, Estimated: >60 mL/min (ref >60)
Glucose, Bld: 130 mg/dL — ABNORMAL HIGH (ref 70–99)
Potassium: 3.7 mmol/L (ref 3.5–5.1)
Sodium: 133 mmol/L — ABNORMAL LOW (ref 135–145)

## 2020-01-28 LAB — CBC
HCT: 35.6 % — ABNORMAL LOW (ref 36.0–46.0)
Hemoglobin: 11 g/dL — ABNORMAL LOW (ref 12.0–15.0)
MCH: 25.9 pg — ABNORMAL LOW (ref 26.0–34.0)
MCHC: 30.9 g/dL (ref 30.0–36.0)
MCV: 83.8 fL (ref 80.0–100.0)
Platelets: 220 10*3/uL (ref 150–400)
RBC: 4.25 MIL/uL (ref 3.87–5.11)
RDW: 15.2 % (ref 11.5–15.5)
WBC: 11.5 10*3/uL — ABNORMAL HIGH (ref 4.0–10.5)
nRBC: 0 % (ref 0.0–0.2)

## 2020-01-28 LAB — RESP PANEL BY RT-PCR (FLU A&B, COVID) ARPGX2
Influenza A by PCR: NEGATIVE
Influenza B by PCR: NEGATIVE
SARS Coronavirus 2 by RT PCR: NEGATIVE

## 2020-01-28 LAB — URINALYSIS, COMPLETE (UACMP) WITH MICROSCOPIC
Bilirubin Urine: NEGATIVE
Glucose, UA: NEGATIVE mg/dL
Hgb urine dipstick: NEGATIVE
Ketones, ur: NEGATIVE mg/dL
Leukocytes,Ua: NEGATIVE
Nitrite: NEGATIVE
Protein, ur: NEGATIVE mg/dL
Specific Gravity, Urine: 1.016 (ref 1.005–1.030)
pH: 5 (ref 5.0–8.0)

## 2020-01-28 LAB — CBG MONITORING, ED: Glucose-Capillary: 137 mg/dL — ABNORMAL HIGH (ref 70–99)

## 2020-01-28 LAB — BRAIN NATRIURETIC PEPTIDE: B Natriuretic Peptide: 256.1 pg/mL — ABNORMAL HIGH (ref 0.0–100.0)

## 2020-01-28 LAB — TROPONIN I (HIGH SENSITIVITY)
Troponin I (High Sensitivity): 11 ng/L (ref <18)
Troponin I (High Sensitivity): 9 ng/L (ref <18)

## 2020-01-28 MED ORDER — ONDANSETRON HCL 4 MG/2ML IJ SOLN: 4.0000 mg | Freq: Four times a day (QID) | INTRAMUSCULAR | Status: DC | PRN

## 2020-01-28 MED ORDER — NYSTATIN 100000 UNIT/GM EX POWD
1.0000 "application " | Freq: Three times a day (TID) | CUTANEOUS | Status: DC
  Administered 2020-01-29 (×2): 1 via TOPICAL
  Filled 2020-01-28: qty 15

## 2020-01-28 MED ORDER — ALBUTEROL SULFATE (2.5 MG/3ML) 0.083% IN NEBU: 2.5000 mg | INHALATION_SOLUTION | Freq: Four times a day (QID) | RESPIRATORY_TRACT | Status: DC | PRN

## 2020-01-28 MED ORDER — MECLIZINE HCL 25 MG PO TABS
25.0000 mg | ORAL_TABLET | Freq: Three times a day (TID) | ORAL | Status: DC | PRN
  Filled 2020-01-28: qty 1

## 2020-01-28 MED ORDER — LEFLUNOMIDE 20 MG PO TABS
20.0000 mg | ORAL_TABLET | ORAL | Status: DC
  Filled 2020-01-28: qty 1

## 2020-01-28 MED ORDER — ACETAMINOPHEN 650 MG RE SUPP: 650.0000 mg | Freq: Four times a day (QID) | RECTAL | Status: DC | PRN

## 2020-01-28 MED ORDER — OXYCODONE HCL 5 MG PO TABS
20.0000 mg | ORAL_TABLET | Freq: Two times a day (BID) | ORAL | Status: DC
  Administered 2020-01-28 – 2020-01-29 (×2): 20 mg via ORAL
  Filled 2020-01-28 (×2): qty 4

## 2020-01-28 MED ORDER — HYDROXYCHLOROQUINE SULFATE 200 MG PO TABS
200.0000 mg | ORAL_TABLET | Freq: Two times a day (BID) | ORAL | Status: DC
  Administered 2020-01-28 – 2020-01-29 (×2): 200 mg via ORAL
  Filled 2020-01-28 (×2): qty 1

## 2020-01-28 MED ORDER — GABAPENTIN 300 MG PO CAPS
300.0000 mg | ORAL_CAPSULE | Freq: Three times a day (TID) | ORAL | Status: DC
  Administered 2020-01-28 – 2020-01-29 (×3): 300 mg via ORAL
  Filled 2020-01-28 (×3): qty 1

## 2020-01-28 MED ORDER — FUROSEMIDE 10 MG/ML IJ SOLN: 40.0000 mg | Freq: Once | INTRAMUSCULAR | Status: DC

## 2020-01-28 MED ORDER — SPIRONOLACTONE 25 MG PO TABS
25.0000 mg | ORAL_TABLET | Freq: Every day | ORAL | Status: DC
  Administered 2020-01-29: 25 mg via ORAL
  Filled 2020-01-28: qty 1

## 2020-01-28 MED ORDER — ATORVASTATIN CALCIUM 20 MG PO TABS
40.0000 mg | ORAL_TABLET | Freq: Every day | ORAL | Status: DC
  Administered 2020-01-28 – 2020-01-29 (×2): 40 mg via ORAL
  Filled 2020-01-28 (×2): qty 2

## 2020-01-28 MED ORDER — POLYETHYLENE GLYCOL 3350 17 G PO PACK: 17.0000 g | PACK | Freq: Every day | ORAL | Status: DC | PRN

## 2020-01-28 MED ORDER — AMLODIPINE BESYLATE 5 MG PO TABS
5.0000 mg | ORAL_TABLET | Freq: Every day | ORAL | Status: DC
  Administered 2020-01-28 – 2020-01-29 (×2): 5 mg via ORAL
  Filled 2020-01-28 (×2): qty 1

## 2020-01-28 MED ORDER — ONDANSETRON HCL 4 MG PO TABS: 4.0000 mg | ORAL_TABLET | Freq: Four times a day (QID) | ORAL | Status: DC | PRN

## 2020-01-28 MED ORDER — FUROSEMIDE 40 MG PO TABS
40.0000 mg | ORAL_TABLET | Freq: Once | ORAL | Status: AC
  Administered 2020-01-28: 40 mg via ORAL
  Filled 2020-01-28: qty 1

## 2020-01-28 MED ORDER — METOPROLOL SUCCINATE ER 50 MG PO TB24
50.0000 mg | ORAL_TABLET | Freq: Every day | ORAL | Status: DC
  Administered 2020-01-28 – 2020-01-29 (×2): 50 mg via ORAL
  Filled 2020-01-28 (×2): qty 1

## 2020-01-28 MED ORDER — DABIGATRAN ETEXILATE MESYLATE 150 MG PO CAPS
150.0000 mg | ORAL_CAPSULE | Freq: Two times a day (BID) | ORAL | Status: DC
  Administered 2020-01-28 – 2020-01-29 (×2): 150 mg via ORAL
  Filled 2020-01-28 (×3): qty 1

## 2020-01-28 MED ORDER — MONTELUKAST SODIUM 10 MG PO TABS
10.0000 mg | ORAL_TABLET | Freq: Every day | ORAL | Status: DC
  Administered 2020-01-28: 10 mg via ORAL
  Filled 2020-01-28: qty 1

## 2020-01-28 MED ORDER — PREDNISONE 20 MG PO TABS
60.0000 mg | ORAL_TABLET | Freq: Once | ORAL | Status: AC
  Administered 2020-01-28: 60 mg via ORAL
  Filled 2020-01-28: qty 3

## 2020-01-28 MED ORDER — ACETAMINOPHEN 325 MG PO TABS: 325.0000 mg | ORAL_TABLET | Freq: Four times a day (QID) | ORAL | Status: DC | PRN

## 2020-01-28 MED ORDER — TERAZOSIN HCL 5 MG PO CAPS
10.0000 mg | ORAL_CAPSULE | Freq: Every day | ORAL | Status: DC
  Administered 2020-01-28: 10 mg via ORAL
  Filled 2020-01-28 (×2): qty 2

## 2020-01-28 MED ORDER — PREDNISONE 20 MG PO TABS
50.0000 mg | ORAL_TABLET | Freq: Every day | ORAL | Status: DC
  Administered 2020-01-29: 50 mg via ORAL
  Filled 2020-01-28: qty 3

## 2020-01-28 MED ORDER — ENOXAPARIN SODIUM 40 MG/0.4ML ~~LOC~~ SOLN: 40.0000 mg | SUBCUTANEOUS | Status: DC

## 2020-01-28 NOTE — ED Notes (Signed)
Messaged floor RN Dawn to give report. Awaiting response.

## 2020-01-28 NOTE — ED Triage Notes (Signed)
Pt comes into the ED via EMS from home with a fall last night, states she has been having increased weakness ,. Normally uses a walker at home, pt is a/ox4, denies any injury from the fall. Pt has BL shoulder pain from her chronic RA.Tina Pacheco

## 2020-01-28 NOTE — H&P (Signed)
History and Physical   [name] JZP:[id number] DOB: [birth year] DOA: 01/28/2020  PCP: Venia Carbon, MD  Outpatient Specialists: [facility], Rheumatology, Dr. Shirlyn Goltz Patient coming from: Home via EMS  I have personally briefly reviewed patient's old medical records in [place].  Chief Concern: Weakness  HPI: Tina Pacheco is a 69 y.o. female with medical history significant for rheumatoid arthritis, stage II moderate active, chronic gout, osteoarthritis of the right knee, long-term use of high-risk medications, depressive disorder, neuropathy, hyperlipidemia, morbid obesity, hypertension, grade 1 diastolic dysfunction presented to the emergency department for chief concerns of acute weakness that started after urination.  She reports she was leaving restroom for urination and she felt weak. She endorsed acute weakness of her knees, arms, and back with ambulation. She felt shaky.  She reports this episode is similar to her presentation end of January early February of this year.  ROS: was headache, fever, chills, vision changes, dysuria, hematuria, chest pain, musculoskeletal pain, abdominal pain, hematuria, diarrhea, constipation, blood in stool. She endorses appetite loss and lack of energy to chew her food.   She endorses one episode of nausea and vomiting. She endorses shortness of breath whenever she had has joint pain.   Social History: lives with spouse, formerly worked for [employer] and currently retired. Denies tobacco use, etoh, recreational drug use. Endorses second hand smoking via father and spouse  Vaccination: endorses both Moderna vaccines   ED Course: Discussed with ED provider   Review of Systems: As per HPI otherwise 10 point review of systems negative.   Assessment/Plan  Principal Problem:   Rheumatoid arthritis flare (HCC) Active Problems:   Morbid obesity (HCC)   HLD (hyperlipidemia)   HTN (hypertension)   Insomnia   Back pain    PAF (paroxysmal atrial fibrillation) (HCC)   Intertrigo   Debility   Lack of physical exercise   Physical deconditioning  Acute weakness-suspect multifactorial including rheumatoid arthritis flare with superimposed debility and deconditioning from lack of physical exercise -Status post furosemide 40 mg p.o. in the ED, prednisone 60 mg p.o. in the ED with improvement of symptoms -PT/OT -Prednisone 50 mg daily -Rheumatologist outpatient follow-up  Paroxysmal atrial fibrillation-rate controlled with home metoprolol succinate 50 mg daily, -Anticoagulate with Pradaxa 150 mg p.o. twice daily resumed  Chronic low back pain-resumed home oxycodone 20 mg p.o. 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 daily nightly  Morbid obesity-extensive discussion regarding benefits of weight loss and physical activity with patient while daughter at bedside -Patient is in agreement -PT OT  As needed medications: Acetaminophen and ondansetron  Chart reviewed.   DVT prophylaxis: Dabigatran 150 p.o. twice daily Code Status: full  Diet: Heart healthy diet Family Communication: Updated daughter at bedside Disposition Plan: Pending clinical course Consults called: Not indicated at this time Admission status: Observation with telemetry  Past Medical History:  Diagnosis Date  . Acute CHF (congestive heart failure) ([place]) 03/30/2019  . Arthritis    RA  . Asthma   . Basal cell carcinoma   . Cataract 2018   bilateral eyes; corrected with surgery  .  Claustrophobia   . Collagen vascular disease (HCC)    RA  . COPD (chronic obstructive pulmonary disease) ([place])   . Diastolic dysfunction    a. echo 07/2014: EF 55-60%, no RWMA, GR2DD, mild MR, LA moderately dilated, PASP 38 mm Hg  . Dyslipidemia   . Dysrhythmia   . Headache    migraines  . Hemihypertrophy   . History of cardiac cath    a. 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 History of gout   . Hyperplastic colonic polyp 2003  . Hypertension   . Hypokalemia   . Morbid obesity ([place])   . PAF (paroxysmal atrial fibrillation) (HCC)    a. on Pradaxa; b. CHADSVASc at least 2 (HTN & female)  . Rheumatoid arthritis(714.0)   . Sleep apnea    a. not compliant with CPAP   Past Surgical History:  Procedure Laterality Date  . ABDOMINAL HYSTERECTOMY    . APPLICATION OF WOUND VAC Right 08/09/2017   Procedure: APPLICATION OF WOUND VAC;  Surgeon: Albertine Patricia, DPM;  Location: [facility] ORS;  Service: Podiatry;  Laterality: Right;  . CARDIAC CATHETERIZATION  05/24/2010   nonobstructive CAD  . CATARACT EXTRACTION W/ INTRAOCULAR LENS IMPLANT Right 06/12/2016   Dr. Darleen Crocker  . CATARACT EXTRACTION W/ INTRAOCULAR LENS IMPLANT Left 06/26/2016   Dr. Darleen Crocker  . CESAREAN SECTION    . CHOLECYSTECTOMY    . COLONOSCOPY  06/12/2011   Procedure: COLONOSCOPY;  Surgeon: Juanita Craver, MD;  Location: [facility];  Service: Endoscopy;  Laterality: N/A;  . COLONOSCOPY N/A 03/17/2013   Procedure: COLONOSCOPY;  Surgeon: Juanita Craver, MD;  Location: [facility];  Service: Endoscopy;  Laterality: N/A;  . EYE SURGERY    . FOOT ARTHRODESIS Right 07/13/2017   Procedure: ARTHRODESIS FOOT-MULTI.FUSIONS (6 JOINTS);  Surgeon: Albertine Patricia, DPM;  Location: [facility] ORS;  Service: Podiatry;  Laterality: Right;  . IRRIGATION AND DEBRIDEMENT FOOT Right 08/09/2017   Procedure: IRRIGATION AND DEBRIDEMENT FOOT;  Surgeon: Albertine Patricia, DPM;  Location: [facility] ORS;  Service: Podiatry;  Laterality: Right;  . KNEE  ARTHROSCOPY     bilateral  . SINUSOTOMY    . TOOTH EXTRACTION  12/2016  . VAGINAL HYSTERECTOMY     Social History:  reports that she has never smoked. She has never used smokeless tobacco. She reports that she does not drink alcohol and does not use drugs.  Allergies  Allergen Reactions  . Fluoxetine     Headache, shaking, sleep issues  . Codeine     Nausea and vomiting/only when taking too much   Family History  Problem Relation Age of Onset  . Emphysema Father        smoked  . Tuberculosis Mother   . Parkinsonism Mother   . Diabetes type II Sister   . Breast cancer Sister   . Breast cancer Maternal Aunt   . Tuberculosis Sister    Family history: Family history reviewed and not pertinent.  Prior to Admission medications   Medication Sig Start Date End Date Taking? Authorizing Provider  albuterol (PROVENTIL) (2.5 MG/3ML) 0.083% nebulizer solution Take 3 mLs (2.5 mg total) by nebulization every 6 (six) hours as needed for wheezing or shortness of breath. 01/08/20   Venia Carbon, MD  amLODipine (NORVASC) 5 MG tablet TAKE 1 TABLET BY MOUTH EVERY DAY IN THE EVENING Patient taking differently: Take 5 mg by mouth daily. TAKE 1 TABLET BY MOUTH EVERY DAY IN THE EVENING 09/04/19   Venia Carbon, MD  atorvastatin (LIPITOR) 20 MG tablet TAKE 1 TABLET BY MOUTH DAILY AT 6 PM 01/25/20   Venia Carbon, MD  dabigatran (PRADAXA) 150 MG CAPS capsule Take 1 capsule (150 mg total) by mouth 2 (two) times daily. 01/26/20   Dunn, Areta Haber, PA-C  fluconazole (DIFLUCAN) 100 MG tablet Take 1 tablet (100 mg total) by mouth daily. Repeat every few weeks as needed 01/14/20   Venia Carbon, MD  folic acid (FOLVITE) 1 MG tablet Take 1 mg by mouth daily.     [provider]  gabapentin (NEURONTIN) 300 MG capsule Take 1 capsule (300 mg total) by mouth 4 (four) times daily. 01/20/20   Venia Carbon, MD  hydroxychloroquine (PLAQUENIL) 200 MG tablet Take 200 mg by mouth 2 (two) times  daily.     [provider]  ketoconazole (NIZORAL) 2 % cream Apply 1 application topically 2 (two) times daily. 12/30/19   [provider]  leflunomide (ARAVA) 20 MG tablet Take 20 mg by mouth every morning.  09/20/11   [provider]  meclizine (ANTIVERT) 25 MG tablet TAKE 1 TABLET(25 MG) BY MOUTH THREE TIMES DAILY AS NEEDED FOR DIZZINESS Patient taking differently: Take 25 mg by mouth 3 (three) times daily as needed for dizziness.  10/20/19   Venia Carbon, MD  metoprolol succinate (TOPROL-XL) 50 MG 24 hr tablet TAKE 1 TABLET BY MOUTH EVERY DAY WITH OR IMMEDIATELY FOLLOWING A MEAL 01/25/20   Viviana Simpler I, MD  montelukast (SINGULAIR) 10 MG tablet TAKE 1 TABLET BY MOUTH AT BEDTIME Patient taking differently: Take 10 mg by mouth at bedtime.  02/07/19   Venia Carbon, MD  Multiple Vitamin (MULTIVITAMIN) tablet Take 1 tablet by mouth daily.     [provider]  nystatin (MYCOSTATIN/NYSTOP) powder Apply 1 application topically 3 (three) times daily. 01/08/20   Venia Carbon, MD  Oxycodone HCl 20 MG TABS Take 1 tablet (20 mg total) by mouth in the morning and at bedtime. Substitute for prev rx 01/06/20   Venia Carbon, MD  polyethylene glycol (MIRALAX / GLYCOLAX) packet Take 17 g by mouth daily as needed for mild constipation. 07/16/17   Henreitta Leber, MD  potassium chloride SA (KLOR-CON) 20 MEQ tablet TAKE 1/2 TABLET BY MOUTH TWICE A DAY(MAY TAKE AN EXTRA TABLET WHEN TAKING TORSEMIDE) Patient taking differently: Take 10 mEq by mouth 2 (two) times daily. TAKE 1/2 TABLET BY MOUTH TWICE A DAY(MAY TAKE AN EXTRA TABLET WHEN TAKING TORSEMIDE) 06/09/19   Walker, Martie Lee, NP  SF 5000 PLUS 1.1 % CREA dental cream Place 1 application onto teeth 2 (two) times daily. 06/01/17   [provider]  sodium chloride (OCEAN) 0.65 % SOLN nasal spray Place 1 spray into both nostrils as needed for congestion.    [provider]  spironolactone  (ALDACTONE) 25 MG tablet Take 1 tablet (25 mg total) by mouth daily. 01/14/20   Venia Carbon, MD  terazosin (HYTRIN) 10 MG capsule TAKE 1 CAPSULE(10 MG) BY MOUTH AT BEDTIME Patient taking differently: Take 10 mg by mouth at bedtime.  01/03/20   Venia Carbon, MD  torsemide (DEMADEX) 20 MG tablet Take 2 tablets (40 mg total) by mouth daily. 01/14/20   Venia Carbon, MD   Physical Exam: Vitals:   01/28/20 1400 01/28/20 1717 01/28/20 1938 01/28/20 2112  BP: 108/72 118/72 121/77 136/62  Pulse: 62 80 81 (!) 59  Resp: 17 19 16 18   Temp:  98 F (36.7 C) 97.8 F (36.6 C)  TempSrc:   Oral Oral  SpO2: 93% 96% 96% 97%  Weight:      Height:       Constitutional: appears older than chronological age, NAD, calm, comfortable Eyes: PERRL, lids and conjunctivae normal ENMT: Mucous membranes are moist. Posterior pharynx clear of any exudate or lesions. Age-appropriate dentition. Hearing appropriate Neck: normal, supple, no masses, no thyromegaly Respiratory: clear to auscultation bilaterally, no wheezing, no crackles. Normal respiratory effort. No accessory muscle use.  Cardiovascular: Regular rate and rhythm, no murmurs / rubs / gallops. No extremity edema. 2+ pedal pulses. No carotid bruits.  Abdomen: Morbid obesity, no tenderness, no masses palpated, no hepatosplenomegaly. Bowel sounds positive.  Musculoskeletal: no clubbing / cyanosis. No joint deformity upper and lower extremities. Good ROM, no contractures, no atrophy. Normal muscle tone.  Skin: no rashes, lesions, ulcers. No induration Neurologic: Sensation intact. Strength 5/5 in all 4.  Psychiatric: Normal judgment and insight. Alert and oriented x 3. Normal mood.   EKG: Independently reviewed, showing normal sinus rhythm with rate of 65, QTc 476  Chest x-ray on Admission: Personally reviewed and I agree with radiologist reading as below.  DG Chest Portable 1 View  Result Date: 01/28/2020 CLINICAL DATA:  Status post fall last  night. EXAM: PORTABLE CHEST 1 VIEW COMPARISON:  PA and lateral chest 01/05/2020. FINDINGS: Cardiomegaly and vascular congestion. No consolidative process or pneumothorax. Small pleural effusions. No acute bony abnormality. IMPRESSION: Cardiomegaly and pulmonary vascular congestion. Small pleural effusions. Electronically Signed   By: Inge Rise M.D.   On: 01/28/2020 12:59   Labs on Admission: I have personally reviewed following labs  CBC: Recent Labs  Lab 01/28/20 1112  WBC 11.5*  HGB 11.0*  HCT 35.6*  MCV 83.8  PLT 007   Basic Metabolic Panel: Recent Labs  Lab 01/28/20 1112  NA 133*  K 3.7  CL 94*  CO2 27  GLUCOSE 130*  BUN 18  CREATININE 1.00  CALCIUM 10.0   CBG: Recent Labs  Lab 01/28/20 1129  GLUCAP 137*   Urine analysis:    Component Value Date/Time   COLORURINE YELLOW (A) 01/28/2020 1256   APPEARANCEUR HAZY (A) 01/28/2020 1256   APPEARANCEUR Cloudy 03/28/2014 1033   LABSPEC 1.016 01/28/2020 1256   LABSPEC 1.018 03/28/2014 1033   PHURINE 5.0 01/28/2020 1256   GLUCOSEU NEGATIVE 01/28/2020 1256   GLUCOSEU Negative 03/28/2014 [place] 01/28/2020 [place] 01/28/2020 1256   BILIRUBINUR Negative 03/28/2014 [place] 01/28/2020 1256   PROTEINUR NEGATIVE 01/28/2020 1256   NITRITE NEGATIVE 01/28/2020 1256   LEUKOCYTESUR NEGATIVE 01/28/2020 1256   LEUKOCYTESUR 3+ 03/28/2014 1033   [place] Alvelo N Maleia Weems D.O. [facility]  If 12AM-7AM, please contact overnight-coverage provider If 7AM-7PM, please contact day coverage provider [email or web address]  01/28/2020, 9:25 PM

## 2020-01-28 NOTE — ED Notes (Signed)
Pt resting at this time. Daughter at bedside. Purewick in place and working at this time.

## 2020-01-28 NOTE — ED Provider Notes (Signed)
[facility] Emergency Department Provider Note ____________________________________________   First MD Initiated Contact with Patient 01/28/20 1211     (approximate)  I have reviewed the triage vital signs and the nursing notes.   HISTORY  Chief Complaint Fall and Weakness    HPI Tina Pacheco is a 69 y.o. female with PMH as noted below including CHF, COPD, hypertension, gout, and obesity who presents with generalized weakness since yesterday, gradual onset, and associated with the fall last night.  The patient states that she normally is able to walk with a walker, but has not been able to get around or get up.  She denies hitting her head or having any other injury from the fall, though she does report chronic joint pain to multiple joints.  She reports one episode of vomiting last night.  She does not feel particularly more short of breath than normal and states that she has been urinating frequently due to being on a diuretic but has no dysuria.  She denies any fever or chills, diarrhea, or cough.   Past Medical History:  Diagnosis Date  . Acute CHF (congestive heart failure) ([place]) 03/30/2019  . Arthritis    RA  . Asthma   . Basal cell carcinoma   . Cataract 2018   bilateral eyes; corrected with surgery  . Claustrophobia   . Collagen vascular disease (HCC)    RA  . COPD (chronic obstructive pulmonary disease) ([place])   . Diastolic dysfunction    a. echo 07/2014: EF 55-60%, no RWMA, GR2DD, mild MR, LA moderately dilated, PASP 38 mm Hg  . Dyslipidemia   . Dysrhythmia   . Headache    migraines  . Hemihypertrophy   . History of cardiac cath    a. 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 History of gout   . Hyperplastic colonic polyp 2003  . Hypertension   . Hypokalemia   . Morbid obesity ([place])   . PAF (paroxysmal atrial fibrillation) (HCC)    a. on Pradaxa; b. CHADSVASc at least 2 (HTN & female)  . Rheumatoid arthritis(714.0)   . Sleep  apnea    a. not compliant with CPAP    Patient Active Problem List   Diagnosis Date Noted  . Intertrigo of genitocrural region due to Candida species 01/05/2020  . Intertrigo 01/05/2020  . Heart failure ([place]) 01/05/2020  . Chronic diastolic heart failure ([place])   . Pressure injury of skin 03/31/2019  . COPD (chronic obstructive pulmonary disease) ([place]) 03/30/2019  . Urge incontinence of urine 12/23/2018  . Left leg pain 09/18/2018  . 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 Preventative health care 03/20/2018  . MDD (major depressive disorder), single episode, moderate ([place]) 03/20/2018  . Advance directive discussed with patient 03/20/2018  . Lymphedema 11/26/2017  . Narcotic dependence ([place]) 03/09/2017  . Vertigo 06/28/2016  . Bronchiectasis without acute exacerbation ([place]) 06/15/2015  . DDD (degenerative disc disease), lumbosacral 03/30/2015  . Sleep apnea   . Atherosclerotic heart disease of native coronary artery with angina pectoris ([place]) 08/25/2014  . PAF (paroxysmal atrial fibrillation) ([place]) 08/25/2014  . 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 RLS (restless legs syndrome) 07/06/2014  . Gout 05/26/2014  . Chronic pain syndrome 05/26/2014  . Migraine 04/08/2014  . Acquired deformity of arm 01/05/2014  . Back pain 07/22/2013  . Metatarsalgia of right foot 07/22/2013  . Insomnia 07/15/2013  . OA (osteoarthritis) 11/20/2012  . DJD (degenerative joint disease)  of cervical spine 09/10/2012  . Trapezius muscle spasm 07/21/2011  . Allergic rhinitis 07/10/2011  . Rheumatoid arthritis ([place]) 03/06/2011  . Morbid obesity ([place]) 06/22/2010  . HLD (hyperlipidemia) 06/22/2010  . HTN (hypertension) 06/22/2010    Past Surgical History:  Procedure Laterality Date  . ABDOMINAL HYSTERECTOMY    . APPLICATION OF WOUND VAC Right 08/09/2017   Procedure: APPLICATION OF WOUND VAC;  Surgeon: Albertine Patricia, DPM;  Location: [facility] ORS;  Service: Podiatry;   Laterality: Right;  . CARDIAC CATHETERIZATION  05/24/2010   nonobstructive CAD  . CATARACT EXTRACTION W/ INTRAOCULAR LENS IMPLANT Right 06/12/2016   Dr. Darleen Crocker  . CATARACT EXTRACTION W/ INTRAOCULAR LENS IMPLANT Left 06/26/2016   Dr. Darleen Crocker  . CESAREAN SECTION    . CHOLECYSTECTOMY    . COLONOSCOPY  06/12/2011   Procedure: COLONOSCOPY;  Surgeon: Juanita Craver, MD;  Location: [facility];  Service: Endoscopy;  Laterality: N/A;  . COLONOSCOPY N/A 03/17/2013   Procedure: COLONOSCOPY;  Surgeon: Juanita Craver, MD;  Location: [facility] ENDOSCOPY;  Service: Endoscopy;  Laterality: N/A;  . EYE SURGERY    . FOOT ARTHRODESIS Right 07/13/2017   Procedure: ARTHRODESIS FOOT-MULTI.FUSIONS (6 JOINTS);  Surgeon: Albertine Patricia, DPM;  Location: [facility] ORS;  Service: Podiatry;  Laterality: Right;  . IRRIGATION AND DEBRIDEMENT FOOT Right 08/09/2017   Procedure: IRRIGATION AND DEBRIDEMENT FOOT;  Surgeon: Albertine Patricia, DPM;  Location: [facility] ORS;  Service: Podiatry;  Laterality: Right;  . KNEE ARTHROSCOPY     bilateral  . SINUSOTOMY    . TOOTH EXTRACTION  12/2016  . VAGINAL HYSTERECTOMY      Prior to Admission medications   Medication Sig Start Date End Date Taking? Authorizing Provider  albuterol (PROVENTIL) (2.5 MG/3ML) 0.083% nebulizer solution Take 3 mLs (2.5 mg total) by nebulization every 6 (six) hours as needed for wheezing or shortness of breath. 01/08/20   Venia Carbon, MD  amLODipine (NORVASC) 5 MG tablet TAKE 1 TABLET BY MOUTH EVERY DAY IN THE EVENING Patient taking differently: Take 5 mg by mouth daily. TAKE 1 TABLET BY MOUTH EVERY DAY IN THE EVENING 09/04/19   Viviana Simpler I, MD  atorvastatin (LIPITOR) 20 MG tablet TAKE 1 TABLET BY MOUTH DAILY AT 6 PM 01/25/20   Venia Carbon, MD  dabigatran (PRADAXA) 150 MG CAPS capsule Take 1 capsule (150 mg total) by mouth 2 (two) times daily. 01/26/20   Dunn, Areta Haber, PA-C  fluconazole (DIFLUCAN) 100 MG tablet Take 1 tablet (100 mg total) by mouth  daily. Repeat every few weeks as needed 01/14/20   Venia Carbon, MD  folic acid (FOLVITE) 1 MG tablet Take 1 mg by mouth daily.     [provider]  gabapentin (NEURONTIN) 300 MG capsule Take 1 capsule (300 mg total) by mouth 4 (four) times daily. 01/20/20   Venia Carbon, MD  hydroxychloroquine (PLAQUENIL) 200 MG tablet Take 200 mg by mouth 2 (two) times daily.     [provider]  ketoconazole (NIZORAL) 2 % cream Apply 1 application topically 2 (two) times daily. 12/30/19   [provider]  leflunomide (ARAVA) 20 MG tablet Take 20 mg by mouth every morning.  09/20/11   [provider]  meclizine (ANTIVERT) 25 MG tablet TAKE 1 TABLET(25 MG) BY MOUTH THREE TIMES DAILY AS NEEDED FOR DIZZINESS Patient taking differently: Take 25 mg by mouth 3 (three) times daily as needed for dizziness.  10/20/19   Venia Carbon, MD  metoprolol succinate (TOPROL-XL) 50  MG 24 hr tablet TAKE 1 TABLET BY MOUTH EVERY DAY WITH OR IMMEDIATELY FOLLOWING A MEAL 01/25/20   Viviana Simpler I, MD  montelukast (SINGULAIR) 10 MG tablet TAKE 1 TABLET BY MOUTH AT BEDTIME Patient taking differently: Take 10 mg by mouth at bedtime.  02/07/19   Venia Carbon, MD  Multiple Vitamin (MULTIVITAMIN) tablet Take 1 tablet by mouth daily.     [provider]  nystatin (MYCOSTATIN/NYSTOP) powder Apply 1 application topically 3 (three) times daily. 01/08/20   Venia Carbon, MD  Oxycodone HCl 20 MG TABS Take 1 tablet (20 mg total) by mouth in the morning and at bedtime. Substitute for prev rx 01/06/20   Venia Carbon, MD  polyethylene glycol (MIRALAX / GLYCOLAX) packet Take 17 g by mouth daily as needed for mild constipation. 07/16/17   Henreitta Leber, MD  potassium chloride SA (KLOR-CON) 20 MEQ tablet TAKE 1/2 TABLET BY MOUTH TWICE A DAY(MAY TAKE AN EXTRA TABLET WHEN TAKING TORSEMIDE) Patient taking differently: Take 10 mEq by mouth 2 (two) times daily. TAKE 1/2 TABLET BY MOUTH  TWICE A DAY(MAY TAKE AN EXTRA TABLET WHEN TAKING TORSEMIDE) 06/09/19   Walker, Martie Lee, NP  SF 5000 PLUS 1.1 % CREA dental cream Place 1 application onto teeth 2 (two) times daily. 06/01/17   [provider]  sodium chloride (OCEAN) 0.65 % SOLN nasal spray Place 1 spray into both nostrils as needed for congestion.    [provider]  spironolactone (ALDACTONE) 25 MG tablet Take 1 tablet (25 mg total) by mouth daily. 01/14/20   Venia Carbon, MD  terazosin (HYTRIN) 10 MG capsule TAKE 1 CAPSULE(10 MG) BY MOUTH AT BEDTIME Patient taking differently: Take 10 mg by mouth at bedtime.  01/03/20   Venia Carbon, MD  torsemide (DEMADEX) 20 MG tablet Take 2 tablets (40 mg total) by mouth daily. 01/14/20   Venia Carbon, MD    Allergies Fluoxetine and Codeine  Family History  Problem Relation Age of Onset  . Emphysema Father        smoked  . Tuberculosis Mother   . Parkinsonism Mother   . Diabetes type II Sister   . Breast cancer Sister   . Breast cancer Maternal Aunt   . Tuberculosis Sister     Social History Social History   Tobacco Use  . Smoking status: Never Smoker  . Smokeless tobacco: Never Used  Vaping Use  . Vaping Use: Never used  Substance Use Topics  . Alcohol use: No  . Drug use: No    Review of Systems  Constitutional: No fever. Eyes: No redness. ENT: No sore throat. Cardiovascular: Denies chest pain. Respiratory: Denies acute shortness of breath. Gastrointestinal: Positive for resolved vomiting. Genitourinary: Negative for dysuria.  Positive for frequency. Musculoskeletal: Positive for back pain. Skin: Negative for rash. Neurological: Negative for headache.   ____________________________________________   PHYSICAL EXAM:  VITAL SIGNS: ED Triage Vitals  Enc Vitals Group     BP 01/28/20 1113 (!) 141/55     Pulse Rate 01/28/20 1113 65     Resp 01/28/20 1113 19     Temp 01/28/20 1113 99.3 F (37.4 C)     Temp Source 01/28/20  1113 Oral     SpO2 01/28/20 1113 94 %     Weight 01/28/20 1104 265 lb 3.2 oz (120.3 kg)     Height 01/28/20 1104 5\' 3"  (1.6 m)     Head Circumference --  Peak Flow --      Pain Score 01/28/20 1104 9     Pain Loc --      Pain Edu? --      Excl. in [place]? --     Constitutional: Alert and oriented.  Somewhat weak appearing but in no acute distress. Eyes: Conjunctivae are normal.  EOMI. Head: Atraumatic. Nose: No congestion/rhinnorhea. Mouth/Throat: Mucous membranes are dry.   Neck: Normal range of motion.  Cardiovascular: Normal rate, regular rhythm. Grossly normal heart sounds.  Good peripheral circulation. Respiratory: Normal respiratory effort.  No retractions. Lungs CTAB. Gastrointestinal: Soft and nontender. No distention.  Genitourinary: No flank tenderness. Musculoskeletal: 2+ bilateral lower extremity edema.  Extremities warm and well perfused.  Neurologic:  Normal speech and language. No gross focal neurologic deficits are appreciated.  Skin:  Skin is warm and dry. No rash noted. Psychiatric: Mood and affect are normal. Speech and behavior are normal.  ____________________________________________   LABS (all labs ordered are listed, but only abnormal results are displayed)  Labs Reviewed  BASIC METABOLIC PANEL - Abnormal; Notable for the following components:      Result Value   Sodium 133 (*)    Chloride 94 (*)    Glucose, Bld 130 (*)    All other components within normal limits  CBC - Abnormal; Notable for the following components:   WBC 11.5 (*)    Hemoglobin 11.0 (*)    HCT 35.6 (*)    MCH 25.9 (*)    All other components within normal limits  URINALYSIS, COMPLETE (UACMP) WITH MICROSCOPIC - Abnormal; Notable for the following components:   Color, Urine YELLOW (*)    APPearance HAZY (*)    All other components within normal limits  BRAIN NATRIURETIC PEPTIDE - Abnormal; Notable for the following components:   B Natriuretic Peptide 256.1 (*)    All other  components within normal limits  CBG MONITORING, ED - Abnormal; Notable for the following components:   Glucose-Capillary 137 (*)    All other components within normal limits  RESP PANEL BY RT-PCR (FLU A&B, COVID) [id number]  TROPONIN I (HIGH SENSITIVITY)  TROPONIN I (HIGH SENSITIVITY)   ____________________________________________  EKG  ED ECG REPORT I, Arta Silence, the attending physician, personally viewed and interpreted this ECG.  Date: 01/28/2020 EKG Time: 1117 Rate: 65 Rhythm: normal sinus rhythm QRS Axis: normal Intervals: normal ST/T Wave abnormalities: normal Narrative Interpretation: no evidence of acute ischemia  ____________________________________________  RADIOLOGY  CXR interpreted by me shows vascular congestion with no focal infiltrate or acute edema  ____________________________________________   PROCEDURES  Procedure(s) performed: No  Procedures  Critical Care performed: No ____________________________________________   INITIAL IMPRESSION / ASSESSMENT AND PLAN / ED COURSE  Pertinent labs & imaging results that were available during my care of the patient were reviewed by me and considered in my medical decision making (see chart for details).  69 year old female with PMH as noted above presents with generalized weakness and a fall last night.  The patient has not been able to get around with her walker.  She denies any significant increase in her shortness of breath.  I reviewed the past medical records in [place].  She was seen in the ED last month with increased shortness of breath with concern for acute CHF, and was admitted and diuresed.  However, the patient states that she felt about the same then as she does today and that the shortness of breath was never the primary issue.  On exam,  she is relatively comfortable appearing.  Her vital signs are normal except for borderline elevated temperature.  There is no evidence of respiratory  distress.  Lungs are clear bilaterally although difficult to auscultate thoroughly due to the patient's habitus.  She does have some peripheral edema.  Exam is otherwise as described above.  Differential includes acute on chronic CHF, UTI or other infection, electrolyte abnormality other metabolic cause.  We will obtain labs, chest x-ray, and reassess.  ----------------------------------------- 6:19 PM on 01/28/2020 -----------------------------------------  Lab work-up is largely unremarkable.  The patient has no evidence of acute infection.  She feels that this is primarily an arthritis/gout flare and states that the joint pain is really what is bothering her the most.  She reports that prednisone has helped significantly with similar episodes in the past.  I gave a dose of prednisone and some Lasix for diuresis.  The patient continues to feel weak and feel like she cannot function at home.  Her daughter is here and confirms this.  Since this is an acute change in her status, I think she warrants admission to monitor her symptoms after treatment and discharge when it is safe to do so based on her function.  The patient agrees with this.  I discussed the case with Dr. Tobie Poet from the hospitalist service. ____________________________________________   FINAL CLINICAL IMPRESSION(S) / ED DIAGNOSES  Final diagnoses:  Generalized weakness  Arthralgia, unspecified joint      NEW MEDICATIONS STARTED DURING THIS VISIT:  New Prescriptions   No medications on file     Note:  This document was prepared using Dragon voice recognition software and may include unintentional dictation errors.   Arta Silence, MD 01/28/20 1820

## 2020-01-28 NOTE — ED Notes (Signed)
Pt refuses to keep BP cuff and pulse ox on at this time.

## 2020-01-28 NOTE — ED Notes (Signed)
[place] was sent to lab. Urine cup was given,she does not have to urinate at this time.

## 2020-01-28 NOTE — ED Notes (Signed)
IV unsuccessful x2. Will put in IV Team consult.

## 2020-01-28 NOTE — ED Notes (Signed)
Pt presents to ED with c/o of having a fall last night, pt states a HX of rheumatoid arthritis and also "all over arthritis" and states she was weak prior to fall  Secondary to the pain, pt denies dizziness prior to fall. Pt denies any pain associated with fall and states "I just always hurt all over". Pt is morbidly obese and states its hard for her to get around. Pt is A&Ox4. NAD noted.

## 2020-01-28 NOTE — ED Notes (Signed)
Lab called and informed of the add-ons for troponin and BNP. [place], with lab, verified this information.

## 2020-01-29 DIAGNOSIS — I10 Essential (primary) hypertension: Secondary | ICD-10-CM

## 2020-01-29 DIAGNOSIS — M2550 Pain in unspecified joint: Secondary | ICD-10-CM | POA: Diagnosis not present

## 2020-01-29 DIAGNOSIS — M069 Rheumatoid arthritis, unspecified: Secondary | ICD-10-CM | POA: Diagnosis not present

## 2020-01-29 LAB — CBC
HCT: 37.7 % (ref 36.0–46.0)
Hemoglobin: 11.7 g/dL — ABNORMAL LOW (ref 12.0–15.0)
MCH: 25.8 pg — ABNORMAL LOW (ref 26.0–34.0)
MCHC: 31 g/dL (ref 30.0–36.0)
MCV: 83.2 fL (ref 80.0–100.0)
Platelets: 223 10*3/uL (ref 150–400)
RBC: 4.53 MIL/uL (ref 3.87–5.11)
RDW: 15 % (ref 11.5–15.5)
WBC: 7.7 10*3/uL (ref 4.0–10.5)
nRBC: 0 % (ref 0.0–0.2)

## 2020-01-29 LAB — BASIC METABOLIC PANEL
Anion gap: 9 (ref 5–15)
BUN: 18 mg/dL (ref 8–23)
CO2: 27 mmol/L (ref 22–32)
Calcium: 10.2 mg/dL (ref 8.9–10.3)
Chloride: 100 mmol/L (ref 98–111)
Creatinine, Ser: 0.77 mg/dL (ref 0.44–1.00)
GFR, Estimated: >60 mL/min (ref >60)
Glucose, Bld: 127 mg/dL — ABNORMAL HIGH (ref 70–99)
Potassium: 4 mmol/L (ref 3.5–5.1)
Sodium: 136 mmol/L (ref 135–145)

## 2020-01-29 MED ORDER — PREDNISONE 10 MG PO TABS: ORAL_TABLET | ORAL | 0 refills | Status: DC

## 2020-01-29 MED ORDER — LEFLUNOMIDE 20 MG PO TABS
20.0000 mg | ORAL_TABLET | Freq: Every morning | ORAL | Status: DC
  Administered 2020-01-29: 20 mg via ORAL
  Filled 2020-01-29: qty 1

## 2020-01-29 NOTE — Evaluation (Signed)
Occupational Therapy Evaluation Patient Details Name: Tina Pacheco MRN: [id number] DOB: [birth year] Today's Date: 01/29/2020    History of Present Illness Tina Pacheco is a 69 y.o. female with medical history significant for rheumatoid arthritis, stage II moderate active, chronic gout, osteoarthritis of the right knee, long-term use of high-risk medications, depressive disorder, neuropathy, hyperlipidemia, morbid obesity, hypertension, grade 1 diastolic dysfunction presented to the emergency department for chief concerns of acute weakness that started after urination.   Clinical Impression   Pt was seen for OT evaluation this date. Prior to hospital admission, pt was generally independent when not having a flare up, PRN assist for donning socks/shoes and husband doing most cooking and cleaning tasks. Pt takes seated shower and has significantly limited her participation in meaningful occupations such as cleaning 2/2 RA. Pt lives with her husband. Currently pt demonstrates impairments as described below (See OT problem list) which functionally limit her ability to perform ADL/self-care tasks. Pt currently requires Min A for LB ADL, Min A for ADL transfers. Pt educated in falls prevention, bladder mgt strategies, AE/adaptive strategies to maximize safety/indep despite RA flares, encouraged meaningful engagement in occupations, educated in role of [place] in addressing pt's goals. Pt would benefit from skilled OT services to address noted impairments and functional limitations (see below for any additional details) in order to maximize safety and independence while minimizing falls risk and caregiver burden. Upon hospital discharge, recommend HHOT to maximize pt safety and return to functional independence during meaningful occupations of daily life.     Follow Up Recommendations  Home health OT    Equipment Recommendations  None recommended by OT    Recommendations for Other Services        Precautions / Restrictions Precautions Precautions: Fall Restrictions Weight Bearing Restrictions: No      Mobility Bed Mobility               General bed mobility comments: deferred 2/2 fatigue    Transfers                 General transfer comment: deferred 2/2 fatigue    Balance Overall balance assessment: Modified Independent                                         ADL either performed or assessed with clinical judgement   ADL Overall ADL's : Needs assistance/impaired                                       General ADL Comments: Min A for LB ADL tasks and anticipate Min A for ADL transfers/mobility     Vision Patient Visual Report: No change from baseline       Perception     Praxis      Pertinent Vitals/Pain Pain Assessment: No/denies pain     Hand Dominance Right   Extremity/Trunk Assessment Upper Extremity Assessment Upper Extremity Assessment: RUE deficits/detail;LUE deficits/detail;Generalized weakness RUE Deficits / Details: unable to reach up above 90 degrees bilaterally RUE Coordination: decreased gross motor LUE Deficits / Details: unable to reach up above 90 degrees bilaterally LUE Coordination: decreased gross motor   Lower Extremity Assessment Lower Extremity Assessment: Generalized weakness   Cervical / Trunk Assessment Cervical / Trunk Assessment: Normal   Communication Communication Communication: No difficulties  Cognition Arousal/Alertness: Awake/alert Behavior During Therapy: WFL for tasks assessed/performed Overall Cognitive Status: Within Functional Limits for tasks assessed                                 General Comments: endorses fatigue   General Comments       Exercises Other Exercises Other Exercises: Pt educated in falls prevention, bladder mgt strategies, AE/adaptive strategies to maximize safety/indep despite RA flares, encouraged meaningful engagement  in occupations, educated in role of HHOT in addressing pt's goals   Shoulder Instructions      Home Living Family/patient expects to be discharged to:: Private residence Living Arrangements: Spouse/significant other;Other relatives Available Help at Discharge: Family Type of Home: House Home Access: Level entry     Home Layout: Two level;Able to live on main level with bedroom/bathroom     Bathroom Shower/Tub: Walk-in shower         Home Equipment: Environmental consultant - 4 wheels;Cane - single point;Wheelchair - manual;Bedside commode;Grab bars - toilet;Shower seat - built in   Additional Comments: uses rollator      Prior Functioning/Environment Level of Independence: Independent with assistive device(s)        Comments: Pt reports peforming most ADLs I'ly, reports husband does cooking, cleaning, and driving. Pt performs light meal prep, sleeps in recliner most recently. Uses sit/lateral leabn techniuque for LB dressing        OT Problem List: Decreased strength;Decreased coordination;Decreased activity tolerance;Impaired balance (sitting and/or standing);Decreased knowledge of use of DME or AE;Pain      OT Treatment/Interventions: Self-care/ADL training;Therapeutic exercise;Therapeutic activities;Energy conservation;DME and/or AE instruction;Patient/family education;Balance training    OT Goals(Current goals can be found in the care plan section) Acute Rehab OT Goals Patient Stated Goal: to get stronger, return home, be able to do things she enjoys like washing dishes, cleaning OT Goal Formulation: With patient Time For Goal Achievement: 02/12/20 Potential to Achieve Goals: Good ADL Goals Pt Will Transfer to Toilet: with supervision;ambulating;bedside commode (LRAD for amb) Additional ADL Goal #1: Pt will verbalize plan to implement at least 1 learned bladder mgt strategy to maximize safety and minimize risk of falls/injury resulting from rushing to bathroom/bladder leak  concern. Additional ADL Goal #2: Pt will verbalize plan to implement at least 2 learned falls prevention strategies to max safety  OT Frequency: Min 1X/week   Barriers to D/C:            Co-evaluation              AM-PAC OT "6 Clicks" Daily Activity     Outcome Measure Help from another person eating meals?: None Help from another person taking care of personal grooming?: A Little Help from another person toileting, which includes using toliet, bedpan, or urinal?: A Little Help from another person bathing (including washing, rinsing, drying)?: A Little Help from another person to put on and taking off regular upper body clothing?: None Help from another person to put on and taking off regular lower body clothing?: A Little 6 Click Score: 20   End of Session    Activity Tolerance: Patient tolerated treatment well Patient left: in bed;with call bell/phone within reach;with bed alarm set  OT Visit Diagnosis: Other abnormalities of gait and mobility (R26.89);Muscle weakness (generalized) (M62.81)                Time: 1441-1510 OT Time Calculation (min): 29 min Charges:  OT General Charges $OT  Visit: 1 Visit OT Evaluation $OT Eval Moderate Complexity: 1 Mod OT Treatments $Self Care/Home Management : 8-22 mins  Jeni Salles, MPH, MS, OTR/L ascom [phone] 01/29/20, 3:53 PM

## 2020-01-29 NOTE — Plan of Care (Signed)

## 2020-01-29 NOTE — Discharge Summary (Signed)
Physician Discharge Summary  Patient ID: Tina Pacheco MRN: [id number] DOB/AGE: 03-20-1950 69 y.o.  Admit date: 01/28/2020 Discharge date: 01/29/2020  Admission Diagnoses:  Discharge Diagnoses:  Principal Problem:   Rheumatoid arthritis flare ([place]) Active Problems:   Morbid obesity ([place])   HLD (hyperlipidemia)   HTN (hypertension)   Insomnia   Back pain   PAF (paroxysmal atrial fibrillation) (HCC)   Intertrigo   Debility   Lack of physical exercise   Physical deconditioning   Discharged Condition: good  Hospital Course:  Tina Pacheco is a 69 y.o. female with medical history significant for rheumatoid arthritis, stage II moderate active, chronic gout, osteoarthritis of the right knee, long-term use of high-risk medications, depressive disorder, neuropathy, hyperlipidemia, morbid obesity, hypertension, grade 1 diastolic dysfunction presented to the emergency department for chief concerns of acute weakness that started after urination.  She is also complaining significant joint pain.  She is admitted to the hospital for admission.  She was given oral steroids.  She feels much better today.  Joint pain is better, at this point, will continue steroid taper.  She will follow up with her PCP and rheumatologist as outpatient.   Consults: None  Significant Diagnostic Studies:   Treatments: Steroids  Discharge Exam: Blood pressure 138/68, pulse (!) 53, temperature 97.9 F (36.6 C), temperature source Oral, resp. rate 20, height 5\' 3"  (1.6 m), weight 120.3 kg, SpO2 97 %. General appearance: alert and cooperative Resp: clear to auscultation bilaterally Cardio: regular rate and rhythm, S1, S2 normal, no murmur, click, rub or gallop GI: soft, non-tender; bowel sounds normal; no masses,  no organomegaly Extremities: extremities normal, atraumatic, no cyanosis or edema  Disposition: Discharge disposition: 01-Home or Self Care       Discharge Instructions    Diet - low sodium  heart healthy   Complete by: As directed    Increase activity slowly   Complete by: As directed      Allergies as of 01/29/2020      Reactions   Fluoxetine    Headache, shaking, sleep issues   Codeine    Nausea and vomiting/only when taking too much      Medication List    TAKE these medications   albuterol (2.5 MG/3ML) 0.083% nebulizer solution Commonly known as: PROVENTIL Take 3 mLs (2.5 mg total) by nebulization every 6 (six) hours as needed for wheezing or shortness of breath.   amLODipine 5 MG tablet Commonly known as: NORVASC TAKE 1 TABLET BY MOUTH EVERY DAY IN THE EVENING What changed:   how much to take  how to take this  when to take this  additional instructions   atorvastatin 20 MG tablet Commonly known as: LIPITOR TAKE 1 TABLET BY MOUTH DAILY AT 6 PM   dabigatran 150 MG Caps capsule Commonly known as: Pradaxa Take 1 capsule (150 mg total) by mouth 2 (two) times daily.   fluconazole 100 MG tablet Commonly known as: DIFLUCAN Take 1 tablet (100 mg total) by mouth daily. Repeat every few weeks as needed   gabapentin 300 MG capsule Commonly known as: NEURONTIN Take 1 capsule (300 mg total) by mouth 4 (four) times daily.   hydroxychloroquine 200 MG tablet Commonly known as: PLAQUENIL Take 200 mg by mouth 2 (two) times daily.   ketoconazole 2 % cream Commonly known as: NIZORAL Apply 1 application topically 2 (two) times daily.   leflunomide 20 MG tablet Commonly known as: ARAVA Take 20 mg by mouth every morning.  meclizine 25 MG tablet Commonly known as: ANTIVERT TAKE 1 TABLET(25 MG) BY MOUTH THREE TIMES DAILY AS NEEDED FOR DIZZINESS What changed: See the new instructions.   metoprolol succinate 50 MG 24 hr tablet Commonly known as: TOPROL-XL TAKE 1 TABLET BY MOUTH EVERY DAY WITH OR IMMEDIATELY FOLLOWING A MEAL   montelukast 10 MG tablet Commonly known as: SINGULAIR TAKE 1 TABLET BY MOUTH AT BEDTIME   nystatin powder Commonly known as:  MYCOSTATIN/NYSTOP Apply 1 application topically 3 (three) times daily. What changed:   when to take this  reasons to take this   Oxycodone HCl 20 MG Tabs Take 1 tablet (20 mg total) by mouth in the morning and at bedtime. Substitute for prev rx   polyethylene glycol 17 g packet Commonly known as: MIRALAX / GLYCOLAX Take 17 g by mouth daily as needed for mild constipation.   potassium chloride SA 20 MEQ tablet Commonly known as: KLOR-CON TAKE 1/2 TABLET BY MOUTH TWICE A DAY(MAY TAKE AN EXTRA TABLET WHEN TAKING TORSEMIDE) What changed:   how much to take  how to take this  when to take this   predniSONE 10 MG tablet Commonly known as: DELTASONE Take 4 tablets (40 mg total) by mouth daily for 3 days, THEN 2 tablets (20 mg total) daily for 3 days, THEN 1 tablet (10 mg total) daily for 3 days. Start taking on: January 29, 2020   SF 5000 Plus 1.1 % Crea dental cream Generic drug: sodium fluoride Place 1 application onto teeth 2 (two) times daily.   spironolactone 25 MG tablet Commonly known as: ALDACTONE Take 1 tablet (25 mg total) by mouth daily.   terazosin 10 MG capsule Commonly known as: HYTRIN TAKE 1 CAPSULE(10 MG) BY MOUTH AT BEDTIME What changed: See the new instructions.   torsemide 20 MG tablet Commonly known as: DEMADEX Take 2 tablets (40 mg total) by mouth daily.       Follow-up Information    Viviana Simpler I, MD Follow up in 1 week(s).   Specialties: Internal Medicine, Pediatrics Contact information: [place] [address] [phone]        Minna Merritts, MD .   Specialty: Cardiology Contact information: [place] [address] [phone]               Signed: Sharen Hones 01/29/2020, 1:24 PM

## 2020-01-29 NOTE — Evaluation (Signed)
Physical Therapy Evaluation Patient Details Name: Tina Pacheco MRN: [id number] DOB: 1950-11-13 Today's Date: 01/29/2020   History of Present Illness  Tina Pacheco is a 69 y.o. female with medical history significant for rheumatoid arthritis, stage II moderate active, chronic gout, osteoarthritis of the right knee, long-term use of high-risk medications, depressive disorder, neuropathy, hyperlipidemia, morbid obesity, hypertension, grade 1 diastolic dysfunction presented to the emergency department for chief concerns of acute weakness that started after urination.  Clinical Impression  Patient received in bed, she is agreeable to PT assessment. Reports she is feeling stronger than yesterday. Performed supine to sit with mod independence. Use of rail, increased time. Requires min assist to bring LEs back up onto bed. She is able to perform sit to stand with min assist and took a few side steps at edge of bed with min assist. Patient will continue to benefit from skilled PT while here to improve strength and functional independence for return home.     Follow Up Recommendations Home health PT;Supervision for mobility/OOB    Equipment Recommendations  None recommended by PT    Recommendations for Other Services       Precautions / Restrictions Precautions Precautions: Fall Restrictions Weight Bearing Restrictions: No      Mobility  Bed Mobility Overal bed mobility: Modified Independent             General bed mobility comments: increased time and effort, use of bed rails.    Transfers Overall transfer level: Needs assistance Equipment used: 1 person hand held assist Transfers: Sit to/from Stand Sit to Stand: Min assist         General transfer comment: patient able to stand from edge of bed with min assist.  Ambulation/Gait Ambulation/Gait assistance: Min assist Gait Distance (Feet): 3 Feet Assistive device: 1 person hand held assist Gait Pattern/deviations:  Step-to pattern;Decreased step length - right;Decreased step length - left Gait velocity: decr   General Gait Details: patient able to take a few steps along edge of bed with min assist.  Stairs            Wheelchair Mobility    Modified Rankin (Stroke Patients Only)       Balance Overall balance assessment: Modified Independent                                           Pertinent Vitals/Pain Pain Assessment: Faces Faces Pain Scale: Hurts little more Pain Location: back, shoulder, knees Pain Descriptors / Indicators: Aching;Grimacing;Guarding;Sore Pain Intervention(s): Limited activity within patient's tolerance;Monitored during session;Repositioned    Home Living Family/patient expects to be discharged to:: Private residence Living Arrangements: Spouse/significant other;Other relatives Available Help at Discharge: Family Type of Home: House Home Access: Level entry     Home Layout: Two level;Able to live on main level with bedroom/bathroom Home Equipment: Walker - 4 wheels;Cane - single point;Wheelchair - manual;Bedside commode;Grab bars - toilet;Shower seat - built in Additional Comments: uses rollator    Prior Function Level of Independence: Independent with assistive device(s)         Comments: Pt reports peforming most ADLs I'ly, reports husband does cooking, cleaning, and driving. Pt performs light meal prep, sleeps in recliner most recently. Uses sit/lateral leabn techniuque for LB dressing     Hand Dominance   Dominant Hand: Right    Extremity/Trunk Assessment   Upper Extremity Assessment Upper Extremity  Assessment: RUE deficits/detail;LUE deficits/detail RUE Coordination: decreased gross motor LUE Deficits / Details: unable to reach up above 90 degrees bilaterally LUE Coordination: decreased gross motor    Lower Extremity Assessment Lower Extremity Assessment: Generalized weakness    Cervical / Trunk Assessment Cervical /  Trunk Assessment: Normal  Communication   Communication: No difficulties  Cognition Arousal/Alertness: Awake/alert Behavior During Therapy: WFL for tasks assessed/performed Overall Cognitive Status: Within Functional Limits for tasks assessed                                        General Comments      Exercises     Assessment/Plan    PT Assessment Patient needs continued PT services  PT Problem List Decreased strength;Decreased mobility;Decreased activity tolerance;Decreased balance;Pain;Obesity       PT Treatment Interventions Therapeutic activities;Gait training;Therapeutic exercise;Patient/family education;Functional mobility training    PT Goals (Current goals can be found in the Care Plan section)  Acute Rehab PT Goals Patient Stated Goal: to get stronger, return home PT Goal Formulation: With patient Time For Goal Achievement: 02/05/20 Potential to Achieve Goals: Fair    Frequency Min 2X/week   Barriers to discharge        Co-evaluation               AM-PAC PT "6 Clicks" Mobility  Outcome Measure Help needed turning from your back to your side while in a flat bed without using bedrails?: A Little Help needed moving from lying on your back to sitting on the side of a flat bed without using bedrails?: A Little Help needed moving to and from a bed to a chair (including a wheelchair)?: A Little Help needed standing up from a chair using your arms (e.g., wheelchair or bedside chair)?: A Little Help needed to walk in hospital room?: A Lot Help needed climbing 3-5 steps with a railing? : Total 6 Click Score: 15    End of Session   Activity Tolerance: Patient tolerated treatment well;Patient limited by lethargy Patient left: in bed;with call bell/phone within reach;with bed alarm set Nurse Communication: Mobility status PT Visit Diagnosis: Muscle weakness (generalized) (M62.81);Unsteadiness on feet (R26.81);Other abnormalities of gait and  mobility (R26.89);Difficulty in walking, not elsewhere classified (R26.2);Pain Pain - part of body:  (multiple areas)    Time: 1005-1032 PT Time Calculation (min) (ACUTE ONLY): 27 min   Charges:   PT Evaluation $PT Eval Moderate Complexity: 1 Mod PT Treatments $Therapeutic Activity: 8-22 mins        [name], PT, GCS 01/29/20,11:52 AM

## 2020-01-29 NOTE — Treatment Plan (Signed)
Pt is refusing tele at this time, MD Roosevelt Locks made aware via secure message.

## 2020-01-30 ENCOUNTER — Telehealth: Payer: Self-pay

## 2020-01-30 ENCOUNTER — Ambulatory Visit: Payer: Medicare Other | Admitting: Family

## 2020-01-30 NOTE — Telephone Encounter (Signed)
Transition Care Management Follow-up Telephone Call  Date of discharge and from where: 01/29/2020, [facility]  How have you been since you were released from the hospital? Patient is feeling better now. Pain is improving in her arms.  Any questions or concerns? No  Items Reviewed:  Did the pt receive and understand the discharge instructions provided? Yes   Medications obtained and verified? Yes   Other? No   Any new allergies since your discharge? No   Dietary orders reviewed? Yes  Do you have support at home? Yes   Home Care and Equipment/Supplies: Were home health services ordered? not applicable If so, what is the name of the agency? N/A  Has the agency set up a time to come to the patient's home? not applicable Were any new equipment or medical supplies ordered?  No What is the name of the medical supply agency? N/A Were you able to get the supplies/equipment? not applicable Do you have any questions related to the use of the equipment or supplies? No  Functional Questionnaire: (I = Independent and D = Dependent) ADLs: I  Bathing/Dressing- I  Meal Prep- I  Eating- I  Maintaining continence- I  Transferring/Ambulation- I  Managing Meds- I  Follow up appointments reviewed:   PCP Hospital f/u appt confirmed? Yes  Scheduled to see Dr. Silvio Pate on 02/05/2020 @ 4 pm.  [place] f/u appt confirmed? Yes  Scheduled to see Dr. Rockey Situ on 02/24/2020 @ 11 am.  Are transportation arrangements needed? No   If their condition worsens, is the pt aware to call PCP or go to the Emergency Dept.? Yes  Was the patient provided with contact information for the PCP's office or ED? Yes  Was to pt encouraged to call back with questions or concerns? Yes

## 2020-01-30 NOTE — Telephone Encounter (Signed)
Noted  

## 2020-02-02 ENCOUNTER — Other Ambulatory Visit: Payer: Self-pay | Admitting: Internal Medicine

## 2020-02-03 ENCOUNTER — Other Ambulatory Visit: Payer: Self-pay | Admitting: *Deleted

## 2020-02-03 ENCOUNTER — Encounter: Payer: Self-pay | Admitting: Internal Medicine

## 2020-02-03 ENCOUNTER — Ambulatory Visit (INDEPENDENT_AMBULATORY_CARE_PROVIDER_SITE_OTHER): Payer: Medicare Other | Admitting: Internal Medicine

## 2020-02-03 ENCOUNTER — Other Ambulatory Visit: Payer: Self-pay

## 2020-02-03 VITALS — BP 138/84 | HR 61 | Temp 97.4°F | Ht 63.0 in | Wt 311.0 lb

## 2020-02-03 DIAGNOSIS — M069 Rheumatoid arthritis, unspecified: Secondary | ICD-10-CM | POA: Diagnosis not present

## 2020-02-03 DIAGNOSIS — M353 Polymyalgia rheumatica: Secondary | ICD-10-CM | POA: Diagnosis not present

## 2020-02-03 DIAGNOSIS — I5032 Chronic diastolic (congestive) heart failure: Secondary | ICD-10-CM

## 2020-02-03 LAB — SEDIMENTATION RATE: Sed Rate: 62 mm/hr — ABNORMAL HIGH (ref 0–30)

## 2020-02-03 LAB — RENAL FUNCTION PANEL
Albumin: 4 g/dL (ref 3.5–5.2)
BUN: 26 mg/dL — ABNORMAL HIGH (ref 6–23)
CO2: 28 mEq/L (ref 19–32)
Calcium: 11.2 mg/dL — ABNORMAL HIGH (ref 8.4–10.5)
Chloride: 98 mEq/L (ref 96–112)
Creatinine, Ser: 1.05 mg/dL (ref 0.40–1.20)
GFR: 54.03 mL/min — ABNORMAL LOW (ref >60.00)
Glucose, Bld: 99 mg/dL (ref 70–99)
Phosphorus: 3.1 mg/dL (ref 2.3–4.6)
Potassium: 3.8 mEq/L (ref 3.5–5.1)
Sodium: 138 mEq/L (ref 135–145)

## 2020-02-03 MED ORDER — PREDNISONE 10 MG PO TABS: 20.0000 mg | ORAL_TABLET | Freq: Every day | ORAL | 2 refills | Status: DC

## 2020-02-03 MED ORDER — POTASSIUM CHLORIDE CRYS ER 20 MEQ PO TBCR
10.0000 meq | EXTENDED_RELEASE_TABLET | Freq: Two times a day (BID) | ORAL | 0 refills | Status: DC
Start: 2020-02-03 — End: 2020-04-30

## 2020-02-03 MED ORDER — OXYCODONE HCL 20 MG PO TABS: 1.0000 | ORAL_TABLET | Freq: Two times a day (BID) | ORAL | 0 refills | Status: DC

## 2020-02-03 NOTE — Assessment & Plan Note (Signed)
I don't think this was a flare Is on the leflunomide

## 2020-02-03 NOTE — Progress Notes (Signed)
Subjective:    Patient ID: Tina Pacheco, female    DOB: August 04, 1950, 69 y.o.   MRN: [id number]  HPI Here for hospital follow up This visit occurred during the SARS-CoV-2 public health emergency.  Safety protocols were in place, including screening questions prior to the visit, additional usage of staff PPE, and extensive cleaning of exam room while observing appropriate contact time as indicated for disinfecting solutions.   Got up at night to go to the bathroom Has sudden severe pain in legs/arms and shaking Couldn't stand or hold herself up Hard to even hold up her arms [place] down  Needed EMS to help her up Didn't go to the ER Happened again the next morning--so back  No specific joint swelling No fevers Felt immediately better after 1 dose of prednisone  Reviewed hospital records and labs  Current Outpatient Medications on File Prior to Visit  Medication Sig Dispense Refill  . albuterol (PROVENTIL) (2.5 MG/3ML) 0.083% nebulizer solution Take 3 mLs (2.5 mg total) by nebulization every 6 (six) hours as needed for wheezing or shortness of breath. 150 mL 1  . amLODipine (NORVASC) 5 MG tablet TAKE 1 TABLET BY MOUTH EVERY DAY IN THE EVENING (Patient taking differently: Take 5 mg by mouth daily. TAKE 1 TABLET BY MOUTH EVERY DAY IN THE EVENING) 90 tablet 3  . atorvastatin (LIPITOR) 20 MG tablet TAKE 1 TABLET BY MOUTH DAILY AT 6 PM 90 tablet 3  . dabigatran (PRADAXA) 150 MG CAPS capsule Take 1 capsule (150 mg total) by mouth 2 (two) times daily. 180 capsule 0  . fluconazole (DIFLUCAN) 100 MG tablet Take 1 tablet (100 mg total) by mouth daily. Repeat every few weeks as needed 7 tablet 3  . gabapentin (NEURONTIN) 300 MG capsule Take 1 capsule (300 mg total) by mouth 4 (four) times daily. 120 capsule 11  . hydroxychloroquine (PLAQUENIL) 200 MG tablet Take 200 mg by mouth 2 (two) times daily.     Marland Kitchen ketoconazole (NIZORAL) 2 % cream Apply 1 application topically 2 (two) times daily.    Marland Kitchen  leflunomide (ARAVA) 20 MG tablet Take 20 mg by mouth every morning.     . meclizine (ANTIVERT) 25 MG tablet TAKE 1 TABLET(25 MG) BY MOUTH THREE TIMES DAILY AS NEEDED FOR DIZZINESS (Patient taking differently: Take 25 mg by mouth 3 (three) times daily as needed for dizziness. ) 90 tablet 5  . metoprolol succinate (TOPROL-XL) 50 MG 24 hr tablet TAKE 1 TABLET BY MOUTH EVERY DAY WITH OR IMMEDIATELY FOLLOWING A MEAL 90 tablet 3  . montelukast (SINGULAIR) 10 MG tablet TAKE 1 TABLET BY MOUTH AT BEDTIME 90 tablet 3  . nystatin (MYCOSTATIN/NYSTOP) powder Apply 1 application topically 3 (three) times daily. (Patient taking differently: Apply 1 application topically 3 (three) times daily as needed. ) 90 g 11  . Oxycodone HCl 20 MG TABS Take 1 tablet (20 mg total) by mouth in the morning and at bedtime. Substitute for prev rx 60 tablet 0  . polyethylene glycol (MIRALAX / GLYCOLAX) packet Take 17 g by mouth daily as needed for mild constipation. 14 each 0  . potassium chloride SA (KLOR-CON) 20 MEQ tablet TAKE 1/2 TABLET BY MOUTH TWICE A DAY(MAY TAKE AN EXTRA TABLET WHEN TAKING TORSEMIDE) (Patient taking differently: Take 10 mEq by mouth 2 (two) times daily. TAKE 1/2 TABLET BY MOUTH TWICE A DAY(MAY TAKE AN EXTRA TABLET WHEN TAKING TORSEMIDE)) 180 tablet 3  . predniSONE (DELTASONE) 10 MG tablet Take 4 tablets (  40 mg total) by mouth daily for 3 days, THEN 2 tablets (20 mg total) daily for 3 days, THEN 1 tablet (10 mg total) daily for 3 days. 21 tablet 0  . SF 5000 PLUS 1.1 % CREA dental cream Place 1 application onto teeth 2 (two) times daily.  1  . spironolactone (ALDACTONE) 25 MG tablet Take 1 tablet (25 mg total) by mouth daily. 30 tablet 3  . terazosin (HYTRIN) 10 MG capsule TAKE 1 CAPSULE(10 MG) BY MOUTH AT BEDTIME (Patient taking differently: Take 10 mg by mouth at bedtime. ) 90 capsule 3  . torsemide (DEMADEX) 20 MG tablet Take 2 tablets (40 mg total) by mouth daily. 180 tablet 3   No current  facility-administered medications on file prior to visit.    Allergies  Allergen Reactions  . Fluoxetine     Headache, shaking, sleep issues  . Codeine     Nausea and vomiting/only when taking too much    Past Medical History:  Diagnosis Date  . Acute CHF (congestive heart failure) ([place]) 03/30/2019  . Arthritis    RA  . Asthma   . Basal cell carcinoma   . Cataract 2018   bilateral eyes; corrected with surgery  . Claustrophobia   . Collagen vascular disease (HCC)    RA  . COPD (chronic obstructive pulmonary disease) ([place])   . Diastolic dysfunction    a. echo 07/2014: EF 55-60%, no RWMA, GR2DD, mild MR, LA moderately dilated, PASP 38 mm Hg  . Dyslipidemia   . Dysrhythmia   . Headache    migraines  . Hemihypertrophy   . History of cardiac cath    a. 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 History of gout   . Hyperplastic colonic polyp 2003  . Hypertension   . Hypokalemia   . Morbid obesity ([place])   . PAF (paroxysmal atrial fibrillation) (HCC)    a. on Pradaxa; b. CHADSVASc at least 2 (HTN & female)  . Rheumatoid arthritis(714.0)   . Sleep apnea    a. not compliant with CPAP    Past Surgical History:  Procedure Laterality Date  . ABDOMINAL HYSTERECTOMY    . APPLICATION OF WOUND VAC Right 08/09/2017   Procedure: APPLICATION OF WOUND VAC;  Surgeon: Albertine Patricia, DPM;  Location: [facility] ORS;  Service: Podiatry;  Laterality: Right;  . CARDIAC CATHETERIZATION  05/24/2010   nonobstructive CAD  . CATARACT EXTRACTION W/ INTRAOCULAR LENS IMPLANT Right 06/12/2016   Dr. Darleen Crocker  . CATARACT EXTRACTION W/ INTRAOCULAR LENS IMPLANT Left 06/26/2016   Dr. Darleen Crocker  . CESAREAN SECTION    . CHOLECYSTECTOMY    . COLONOSCOPY  06/12/2011   Procedure: COLONOSCOPY;  Surgeon: Juanita Craver, MD;  Location: [facility];  Service: Endoscopy;  Laterality: N/A;  . COLONOSCOPY N/A 03/17/2013   Procedure: COLONOSCOPY;  Surgeon: Juanita Craver, MD;  Location: [facility] ENDOSCOPY;  Service:  Endoscopy;  Laterality: N/A;  . EYE SURGERY    . FOOT ARTHRODESIS Right 07/13/2017   Procedure: ARTHRODESIS FOOT-MULTI.FUSIONS (6 JOINTS);  Surgeon: Albertine Patricia, DPM;  Location: [facility] ORS;  Service: Podiatry;  Laterality: Right;  . IRRIGATION AND DEBRIDEMENT FOOT Right 08/09/2017   Procedure: IRRIGATION AND DEBRIDEMENT FOOT;  Surgeon: Albertine Patricia, DPM;  Location: [facility] ORS;  Service: Podiatry;  Laterality: Right;  . KNEE ARTHROSCOPY     bilateral  . SINUSOTOMY    . TOOTH EXTRACTION  12/2016  . VAGINAL HYSTERECTOMY      Family History  Problem Relation  Age of Onset  . Emphysema Father        smoked  . Tuberculosis Mother   . Parkinsonism Mother   . Diabetes type II Sister   . Breast cancer Sister   . Breast cancer Maternal Aunt   . Tuberculosis Sister     Social History   Socioeconomic History  . Marital status: Married    Spouse name: Not on file  . Number of children: 1  . Years of education: Not on file  . Highest education level: Not on file  Occupational History  . 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 Smoking status: Never Smoker  . Smokeless tobacco: Never Used  Vaping Use  . Vaping Use: Never used  Substance and Sexual Activity  . Alcohol use: No  . Drug use: No  . Sexual activity: Not Currently  Other Topics Concern  . Not on file  Social History Narrative   No living will   Husband, then daughter should be decision maker   Would accept resuscitation attempts   Not sure about tube feeds   Social Determinants of Health   Financial Resource Strain:   . Difficulty of Paying Living Expenses: Not on file  Food Insecurity: No Food Insecurity  . Worried About Charity fundraiser in the Last Year: Never true  . Ran Out of Food in the Last Year: Never true  Transportation Needs: No Transportation Needs  . Lack of Transportation (Medical): No  . Lack of Transportation (Non-Medical): No  Physical  Activity:   . Days of Exercise per Week: Not on file  . Minutes of Exercise per Session: Not on file  Stress:   . Feeling of Stress : Not on file  Social Connections:   . Frequency of Communication with Friends and Family: Not on file  . Frequency of Social Gatherings with Friends and Family: Not on file  . Attends Religious Services: Not on file  . Active Member of Clubs or Organizations: Not on file  . Attends Archivist Meetings: Not on file  . Marital Status: Not on file  Intimate Partner Violence:   . Fear of Current or Ex-Partner: Not on file  . Emotionally Abused: Not on file  . Physically Abused: Not on file  . Sexually Abused: Not on file   Review of Systems Has been weighing daily after getting scale Now diuresing and has lost 30+ # Notices a difference in how much her legs weigh Breathing is some better    Objective:   Physical Exam Cardiovascular:     Rate and Rhythm: Normal rate and regular rhythm.     Heart sounds: No murmur heard.  No gallop.   Pulmonary:     Effort: Pulmonary effort is normal.     Breath sounds: Normal breath sounds. No wheezing or rales.  Musculoskeletal:     Cervical back: Neck supple.     Comments: Thick calves but not hard with fluid like in the past No active synovitis  Lymphadenopathy:     Cervical: No cervical adenopathy.  Neurological:     Comments: No focal weakness  Psychiatric:        Mood and Affect: Mood normal.        Behavior: Behavior normal.            Assessment & Plan:

## 2020-02-03 NOTE — Assessment & Plan Note (Signed)
Marked weight/fluid loss with torsemide/spironolactone Will keep follow up with CHF clinic and Dr Rockey Situ

## 2020-02-03 NOTE — Assessment & Plan Note (Signed)
Was diagnosed with RA flare in hospital---but has no synovitis or specific joint symptoms Most consistent with PMR Instant response to prednisone is typical Will continue prednisone 20mg  for now----consider wean in about 1 month Needs to see Dr Patel--rheumatology to confirm

## 2020-02-03 NOTE — Patient Outreach (Signed)
[name]) Care Management  02/03/2020  Tina Pacheco 18-Mar-1950 [id number]  Unsuccessful outreach attempt made to patient. Patient answered that phone and stated today was not a good time for her to speak. Patient did request that this nurse call her back at a later date.   Plan: RN Health Coach will call patient within the month of January.   Tina Loron RN, BSN [place] [phone] [email or web address]

## 2020-02-05 ENCOUNTER — Ambulatory Visit: Payer: Medicare Other | Admitting: Internal Medicine

## 2020-02-06 ENCOUNTER — Telehealth: Payer: Self-pay | Admitting: Internal Medicine

## 2020-02-06 NOTE — Telephone Encounter (Signed)
Patient called to let Dr. Silvio Pate know she has gained 5 lbs over 2 days. Patient uses [employer].

## 2020-02-06 NOTE — Telephone Encounter (Signed)
She is taking 2 torsemide and 1 spironolactone daily. 4# yesterday and 1# this a.m. She is also asking how much fluid should she drink a day.

## 2020-02-06 NOTE — Telephone Encounter (Signed)
She should take an additional dose of torsemide --- 2 of the 20mg  tabs after lunch daily until her weight goes back down again. For now, just take the spironolactone with the morning dose

## 2020-02-06 NOTE — Telephone Encounter (Signed)
Spoke to pt. She will do this. Advised her there is not a fluid restriction but Dr Silvio Pate said to watch salt intake.

## 2020-02-10 ENCOUNTER — Other Ambulatory Visit: Payer: Self-pay

## 2020-02-10 ENCOUNTER — Ambulatory Visit (INDEPENDENT_AMBULATORY_CARE_PROVIDER_SITE_OTHER): Payer: Medicare Other | Admitting: Dermatology

## 2020-02-10 DIAGNOSIS — I2510 Atherosclerotic heart disease of native coronary artery without angina pectoris: Secondary | ICD-10-CM

## 2020-02-10 DIAGNOSIS — B372 Candidiasis of skin and nail: Secondary | ICD-10-CM | POA: Diagnosis not present

## 2020-02-10 MED ORDER — KETOCONAZOLE 2 % EX CREA: 1.0000 "application " | TOPICAL_CREAM | Freq: Two times a day (BID) | CUTANEOUS | 3 refills | Status: AC

## 2020-02-10 MED ORDER — EUCRISA 2 % EX OINT: TOPICAL_OINTMENT | CUTANEOUS | 3 refills | Status: DC

## 2020-02-10 NOTE — Progress Notes (Signed)
    Follow-Up Visit   Subjective  Tina Pacheco is a 69 y.o. female who presents for the following: Rash (Of the abdomen and thighs - She was recently prescribed a "fluid pill" which makes her urinate more frequently. Due to this she has been using an adult diaper and think rash may be due to irritation and yeast. She has been using her husbands's TMC 0.1% ointment which has seemed to help. She has also been prescribed Fluconazole with refills. ).   The following portions of the chart were reviewed this encounter and updated as appropriate:       Review of Systems:  No other skin or systemic complaints except as noted in HPI or Assessment and Plan.  Objective  Well appearing patient in no apparent distress; mood and affect are within normal limits.  A focused examination was performed including abdomen, groin, and thighs. Relevant physical exam findings are noted in the Assessment and Plan.  Objective  abdomen, inguinal creases, thighs: Mild erythema on the inguinal crease, upper thigh, and abdomen.   Assessment & Plan  Candidal intertrigo abdomen, inguinal creases, thighs  Chronic condition- with recent flare improved with Oral fluconazole, With irritant dermatitis   Advised patient that she shouldn't be using TMC 0.1% ointment in body creases/folds more than a few days at a time due to risk of atrophy. Topical steroids (such as triamcinolone, fluocinolone, fluocinonide, mometasone, clobetasol, halobetasol, betamethasone, hydrocortisone) can cause thinning and lightening of the skin if they are used for too long in the same area. Your physician has selected the right strength medicine for your problem and area affected on the body. Please use your medication only as directed by your physician to prevent side effects.   Start Ketoconazole 2% cream to aa's QD - OK to cover with Vaseline, Desitin, or other zinc oxide cream as skin protectant.  Start Eucrisa ointment bid to aas skin  folds, apply over ketoconazole cream  Continue Fluconazole 100mg  as prescribed by other physician - use PRN flares.   Crisaborole (EUCRISA) 2 % OINT - abdomen, inguinal creases, thighs  ketoconazole (NIZORAL) 2 % cream - abdomen, inguinal creases, thighs  Return if symptoms worsen or fail to improve.  Luther Redo, CMA, am acting as scribe for Brendolyn Patty, MD .  Documentation: I have reviewed the above documentation for accuracy and completeness, and I agree with the above.  Brendolyn Patty MD

## 2020-02-18 DIAGNOSIS — M0579 Rheumatoid arthritis with rheumatoid factor of multiple sites without organ or systems involvement: Secondary | ICD-10-CM | POA: Diagnosis not present

## 2020-02-23 NOTE — Progress Notes (Signed)
Date:  02/24/2020   ID:  Tina, Pacheco Dec 03, 1950, MRN [id number]  Patient Location:  [place] [address]   Provider location:   [facility], [place] office  PCP:  Venia Carbon, MD  Cardiologist:  Ida Rogue, MD   Chief Complaint  Patient presents with  . Other    6 month f/u no complaints today. Meds reviewed verbally with pt.     History of Present Illness:    Tina Pacheco is a 69 y.o. female  past medical history of: morbid obesity , fibromyalgia  March 26,2012paroxysmal atrial fibrillation,  echocardiogram showing normal LV function with diastolic dysfunction,  hyperlipidemia, hypertension,  cardiac catheterization May 24 2010 mild nonobstructive coronary artery disease,  asthma/COPD,  suspected sleep apnea,  atrial fibrillation March and July 2012, October 2013.(no symptoms) started on diltiazem infusion and converted to normal sinus rhythm  rheumatoid arthritis  who presents for routine followup of her atrial fibrillation, chronic diastolic CHF.  Recent weight gain 60 pounds In hospital November into 01/2020, discharged on December 2 Records reviewed in detail Treated with IV Lasix D/c weight recorded as 120 KG/264 pounds, likely inaccurate,  Doubled up on torsemide since discharge, weight at home and in the office down to [place] with Dr Silvio Pate: 3 weeks ago Cr 1.05, BUN 26  Taking torsemide 40 daily currently, feels there is more fluid to come off  EKG personally reviewed by myself on todays visit NSR rate 51 bpm no ST or T wave changes Denies any symptoms from bradycardia  Previous Echo in 2016 with EF [id number], diastolic dysfunction [id number]: EF 55%   Prior CV studies:   The following studies were reviewed today:  Echo 2016 Left ventricle: The cavity size was normal. There was moderate   concentric hypertrophy. Systolic function was normal. The   estimated ejection fraction was in the range  of 55% to 60%. Wall   motion was normal; there were no regional wall motion   abnormalities. Features are consistent with a pseudonormal left   ventricular filling pattern, with concomitant abnormal relaxation   and increased filling pressure (grade 2 diastolic dysfunction). - Mitral valve: Calcified annulus. There was mild regurgitation. - Left atrium: The atrium was moderately dilated. - Pulmonary arteries: Systolic pressure was mildly increased. PA   peak pressure: 38 mm Hg (S).  Past Medical History:  Diagnosis Date  . Acute CHF (congestive heart failure) ([place]) 03/30/2019  . Arthritis    RA  . Asthma   . Basal cell carcinoma   . Cataract 2018   bilateral eyes; corrected with surgery  . Claustrophobia   . Collagen vascular disease (HCC)    RA  . COPD (chronic obstructive pulmonary disease) ([place])   . Diastolic dysfunction    a. echo 07/2014: EF 55-60%, no RWMA, GR2DD, mild MR, LA moderately dilated, PASP 38 mm Hg  . Dyslipidemia   . Dysrhythmia   . Headache    migraines  . Hemihypertrophy   . History of cardiac cath    a. 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 History of gout   . Hyperplastic colonic polyp 2003  . Hypertension   . Hypokalemia   . Morbid obesity ([place])   . PAF (paroxysmal atrial fibrillation) (HCC)    a. on Pradaxa; b. CHADSVASc at least 2 (HTN & female)  . Rheumatoid arthritis(714.0)   . Sleep apnea    a. not compliant with  CPAP   Past Surgical History:  Procedure Laterality Date  . ABDOMINAL HYSTERECTOMY    . APPLICATION OF WOUND VAC Right 08/09/2017   Procedure: APPLICATION OF WOUND VAC;  Surgeon: Albertine Patricia, DPM;  Location: [facility];  Service: Podiatry;  Laterality: Right;  . CARDIAC CATHETERIZATION  05/24/2010   nonobstructive CAD  . CATARACT EXTRACTION W/ INTRAOCULAR LENS IMPLANT Right 06/12/2016   Dr. Darleen Crocker  . CATARACT EXTRACTION W/ INTRAOCULAR LENS IMPLANT Left 06/26/2016   Dr. Darleen Crocker  . CESAREAN SECTION    .  CHOLECYSTECTOMY    . COLONOSCOPY  06/12/2011   Procedure: COLONOSCOPY;  Surgeon: Juanita Craver, MD;  Location: [facility];  Service: Endoscopy;  Laterality: N/A;  . COLONOSCOPY N/A 03/17/2013   Procedure: COLONOSCOPY;  Surgeon: Juanita Craver, MD;  Location: [facility];  Service: Endoscopy;  Laterality: N/A;  . EYE SURGERY    . FOOT ARTHRODESIS Right 07/13/2017   Procedure: ARTHRODESIS FOOT-MULTI.FUSIONS (6 JOINTS);  Surgeon: Albertine Patricia, DPM;  Location: [facility];  Service: Podiatry;  Laterality: Right;  . IRRIGATION AND DEBRIDEMENT FOOT Right 08/09/2017   Procedure: IRRIGATION AND DEBRIDEMENT FOOT;  Surgeon: Albertine Patricia, DPM;  Location: [facility];  Service: Podiatry;  Laterality: Right;  . KNEE ARTHROSCOPY     bilateral  . SINUSOTOMY    . TOOTH EXTRACTION  12/2016  . VAGINAL HYSTERECTOMY       Current Outpatient Medications on File Prior to Visit  Medication Sig Dispense Refill  . albuterol (PROVENTIL) (2.5 MG/3ML) 0.083% nebulizer solution Take 3 mLs (2.5 mg total) by nebulization every 6 (six) hours as needed for wheezing or shortness of breath. 150 mL 1  . amLODipine (NORVASC) 5 MG tablet TAKE 1 TABLET BY MOUTH EVERY DAY IN THE EVENING (Patient taking differently: Take 5 mg by mouth daily. TAKE 1 TABLET BY MOUTH EVERY DAY IN THE EVENING) 90 tablet 3  . atorvastatin (LIPITOR) 20 MG tablet TAKE 1 TABLET BY MOUTH DAILY AT 6 PM 90 tablet 3  . Certolizumab Pegol (CIMZIA [place]) Inject into the skin every 14 (fourteen) days.    Stasia Cavalier (EUCRISA) 2 % OINT Apply to aa's BID PRN flares. 60 g 3  . dabigatran (PRADAXA) 150 MG CAPS capsule Take 1 capsule (150 mg total) by mouth 2 (two) times daily. 180 capsule 0  . fluconazole (DIFLUCAN) 100 MG tablet Take 1 tablet (100 mg total) by mouth daily. Repeat every few weeks as needed 7 tablet 3  . gabapentin (NEURONTIN) 300 MG capsule Take 1 capsule (300 mg total) by mouth 4 (four) times daily. 120 capsule 11  . hydroxychloroquine (PLAQUENIL) 200  MG tablet Take 200 mg by mouth 2 (two) times daily.     Marland Kitchen ketoconazole (NIZORAL) 2 % cream Apply 1 application topically 2 (two) times daily.    Marland Kitchen ketoconazole (NIZORAL) 2 % cream Apply 1 application topically in the morning and at bedtime. 60 g 3  . leflunomide (ARAVA) 20 MG tablet Take 20 mg by mouth every morning.     . meclizine (ANTIVERT) 25 MG tablet TAKE 1 TABLET(25 MG) BY MOUTH THREE TIMES DAILY AS NEEDED FOR DIZZINESS (Patient taking differently: Take 25 mg by mouth 3 (three) times daily as needed for dizziness.) 90 tablet 5  . metoprolol succinate (TOPROL-XL) 50 MG 24 hr tablet TAKE 1 TABLET BY MOUTH EVERY DAY WITH OR IMMEDIATELY FOLLOWING A MEAL 90 tablet 3  . montelukast (SINGULAIR) 10 MG tablet TAKE 1 TABLET BY MOUTH AT BEDTIME 90 tablet  3  . nystatin (MYCOSTATIN/NYSTOP) powder Apply 1 application topically 3 (three) times daily. (Patient taking differently: Apply 1 application topically 3 (three) times daily as needed.) 90 g 11  . Oxycodone HCl 20 MG TABS Take 1 tablet (20 mg total) by mouth in the morning and at bedtime. Substitute for prev rx 60 tablet 0  . polyethylene glycol (MIRALAX / GLYCOLAX) packet Take 17 g by mouth daily as needed for mild constipation. 14 each 0  . potassium chloride SA (KLOR-CON) 20 MEQ tablet Take 0.5 tablets (10 mEq total) by mouth 2 (two) times daily. 1 tablet 0  . predniSONE (DELTASONE) 10 MG tablet Take 2 tablets (20 mg total) by mouth daily with breakfast. Wean as directed 60 tablet 2  . SF 5000 PLUS 1.1 % CREA dental cream Place 1 application onto teeth 2 (two) times daily.  1  . spironolactone (ALDACTONE) 25 MG tablet Take 1 tablet (25 mg total) by mouth daily. 30 tablet 3  . terazosin (HYTRIN) 10 MG capsule TAKE 1 CAPSULE(10 MG) BY MOUTH AT BEDTIME (Patient taking differently: Take 10 mg by mouth at bedtime.) 90 capsule 3  . torsemide (DEMADEX) 20 MG tablet Take 2 tablets (40 mg total) by mouth daily. (Patient taking differently: Take 40 mg by mouth  in the morning and at bedtime. Take twice daily when weight is up.) 180 tablet 3   No current facility-administered medications on file prior to visit.    Allergies:   Fluoxetine and Codeine   Social History   Tobacco Use  . Smoking status: Never Smoker  . Smokeless tobacco: Never Used  Vaping Use  . Vaping Use: Never used  Substance Use Topics  . Alcohol use: No  . Drug use: No     Family Hx: The patient's family history includes Breast cancer in her maternal aunt and sister; Diabetes type II in her sister; Emphysema in her father; Parkinsonism in her mother; Tuberculosis in her mother and sister.  ROS:   Please see the history of present illness.    Review of Systems  Constitutional: Negative.        Weak  Respiratory: Negative.   Cardiovascular: Negative.   Gastrointestinal: Negative.   Musculoskeletal: Positive for joint pain.  Neurological: Negative.   Psychiatric/Behavioral: Negative.   All other systems reviewed and are negative.    Labs/Other Tests and Data Reviewed:    Recent Labs: 01/05/2020: ALT 13 01/28/2020: B Natriuretic Peptide 256.1 01/29/2020: Hemoglobin 11.7; Platelets 223 02/03/2020: BUN 26; Creatinine, Ser 1.05; Potassium 3.8; Sodium 138   Recent Lipid Panel Lab Results  Component Value Date/Time   CHOL 113 08/27/2019 12:47 PM   TRIG 70.0 08/27/2019 12:47 PM   HDL 43.70 08/27/2019 12:47 PM   CHOLHDL 3 08/27/2019 12:47 PM   LDLCALC 56 08/27/2019 12:47 PM    Wt Readings from Last 3 Encounters:  02/24/20 (!) 308 lb 8 oz (139.9 kg)  02/03/20 (!) 311 lb (141.1 kg)  01/28/20 265 lb 3.2 oz (120.3 kg)     Exam:    Vital Signs:  BP 112/70 (BP Location: Left Arm, Patient Position: Sitting, Cuff Size: Large)   Pulse (!) 51   Ht 5\' 3"  (1.6 m)   Wt (!) 308 lb 8 oz (139.9 kg)   SpO2 97%   BMI 54.65 kg/m   Constitutional:  oriented to person, place, and time. No distress.  HENT:  Head: Grossly normal Eyes:  no discharge. No scleral icterus.   Neck: No JVD,  no carotid bruits  Cardiovascular: Regular rate and rhythm, no murmurs appreciated Pulmonary/Chest: Clear to auscultation bilaterally, no wheezes or rails Abdominal: Soft.  no distension.  no tenderness.  Musculoskeletal: Normal range of motion Neurological:  normal muscle tone. Coordination normal. No atrophy Skin: Skin warm and dry Psychiatric: normal affect, pleasant  ASSESSMENT & PLAN:     1. PAF (paroxysmal atrial fibrillation) ([place]) Denies any significant symptoms, continue metoprolol with anticoagulation  2. Essential hypertension Blood pressure low but asymptomatic, does not want to change her medications No changes made  3. Mixed hyperlipidemia Cholesterol at goal, no changes made  4. Rheumatoid arthritis, involving unspecified site, unspecified rheumatoid factor presence ([place]) Started on new medication, Concerned about potential for side effects but not having any at this time  5.  Morbid obesity Not active, poor diet Discussed dietary changes Previously doing weight watchers, unable to do this as she needs to go to the meetings she feels  6.  Depression Managed by primary care  7.  Chronic diastolic CHF Goal weight likely less than 210 pounds She will continue torsemide 40 daily, extra torsemide in the afternoon for weight gain We will restart low-dose potassium 10 twice daily   Signed, Ida Rogue, MD  02/24/2020 11:50 AM    [place] Office [address], [place], [place] [address]

## 2020-02-24 ENCOUNTER — Ambulatory Visit (INDEPENDENT_AMBULATORY_CARE_PROVIDER_SITE_OTHER): Payer: Medicare Other | Admitting: Cardiovascular Disease

## 2020-02-24 ENCOUNTER — Encounter: Payer: Self-pay | Admitting: Cardiovascular Disease

## 2020-02-24 ENCOUNTER — Other Ambulatory Visit: Payer: Self-pay

## 2020-02-24 VITALS — BP 112/70 | HR 51 | Ht 63.0 in | Wt 308.5 lb

## 2020-02-24 DIAGNOSIS — E782 Mixed hyperlipidemia: Secondary | ICD-10-CM

## 2020-02-24 DIAGNOSIS — I5032 Chronic diastolic (congestive) heart failure: Secondary | ICD-10-CM | POA: Diagnosis not present

## 2020-02-24 DIAGNOSIS — I10 Essential (primary) hypertension: Secondary | ICD-10-CM

## 2020-02-24 DIAGNOSIS — I480 Paroxysmal atrial fibrillation: Secondary | ICD-10-CM | POA: Diagnosis not present

## 2020-02-24 DIAGNOSIS — J449 Chronic obstructive pulmonary disease, unspecified: Secondary | ICD-10-CM

## 2020-02-24 DIAGNOSIS — I890 Lymphedema, not elsewhere classified: Secondary | ICD-10-CM | POA: Diagnosis not present

## 2020-02-24 DIAGNOSIS — I2510 Atherosclerotic heart disease of native coronary artery without angina pectoris: Secondary | ICD-10-CM | POA: Diagnosis not present

## 2020-02-24 NOTE — Patient Instructions (Addendum)
Weight daily, Take extra torsemide for weight over 307  Medication Instructions:  Restart potassium as listed  If you need a refill on your cardiac medications before your next appointment, please call your pharmacy.    Lab work: No new labs needed   If you have labs (blood work) drawn today and your tests are completely normal, you will receive your results only by: Marland Kitchen MyChart Message (if you have MyChart) OR . A paper copy in the mail If you have any lab test that is abnormal or we need to change your treatment, we will call you to review the results.   Testing/Procedures: No new testing needed   Follow-Up: At [facility], you and your health needs are our priority.  As part of our continuing mission to provide you with exceptional heart care, we have created designated Provider Care Teams.  These Care Teams include your primary Cardiologist (physician) and Advanced Practice Providers (APPs -  Physician Assistants and Nurse Practitioners) who all work together to provide you with the care you need, when you need it.  . You will need a follow up appointment in 3 months  . Providers on your designated Care Team:   . Nicolasa Ducking, NP . Eula Listen, PA-C . Marisue Ivan, PA-C  Any Other Special Instructions Will Be Listed Below (If Applicable).  COVID-19 Vaccine Information can be found at: [email or web address] For questions related to vaccine distribution or appointments, please email [email or web address] or call [phone].

## 2020-03-01 ENCOUNTER — Ambulatory Visit: Payer: Medicare Other | Admitting: Family

## 2020-03-03 ENCOUNTER — Other Ambulatory Visit: Payer: Self-pay | Admitting: Internal Medicine

## 2020-03-03 MED ORDER — OXYCODONE HCL 20 MG PO TABS: 1.0000 | ORAL_TABLET | Freq: Two times a day (BID) | ORAL | 0 refills | Status: DC

## 2020-03-03 NOTE — Telephone Encounter (Signed)
Pt called in needed a refill on oxycodone

## 2020-03-03 NOTE — Telephone Encounter (Signed)
Name of Medication:oxycodone 10 mg Name of Pharmacy:[facility] [address] Last Fill or Written Date and Quantity:02-03-20 #120 Last Office Visit and Type:02-03-20 Next Office Visit and Type: 03-09-20 Last Controlled Substance Agreement Date:05/23/19 Last UDS:05/14/19

## 2020-03-04 DIAGNOSIS — M0579 Rheumatoid arthritis with rheumatoid factor of multiple sites without organ or systems involvement: Secondary | ICD-10-CM | POA: Diagnosis not present

## 2020-03-08 ENCOUNTER — Telehealth: Payer: Self-pay

## 2020-03-08 NOTE — Telephone Encounter (Signed)
It is actually being used to treat the irritant dermatitis of the body fold, not the candida.  We can send in tacrolimus 0.1% ointment instead 60 gm qd/bid.  It is being used for dermatitis, not Candida.

## 2020-03-08 NOTE — Telephone Encounter (Signed)
Eucrisa denied by insurance. They state that due to the diagnosis of candidal intertrigo it is considered an experimental or investigative use.

## 2020-03-09 ENCOUNTER — Ambulatory Visit: Payer: Medicare Other | Admitting: Internal Medicine

## 2020-03-10 ENCOUNTER — Other Ambulatory Visit: Payer: Self-pay | Admitting: *Deleted

## 2020-03-10 NOTE — Patient Outreach (Signed)
[facility]) Care Management  [place]  03/10/2020   CLORIA CIRESI 1951/02/11 [id number]  Subjective: Successful telephone outreach call to patient. HIPAA identifiers obtained. Patient reports she is doing well. She has started to weigh herself daily, record the values, and take her Torsemide as prescribed. Patient is more motivated to take care of her health. She has begun to do chair exercises, use portion control when eating, and limits the amount of sodium she is eating. Patient reports that the candida irritation around her groin area has improved after seeing the dermatologist and states she will make an appointment soon to go to her dentist and eye doctor. Patient states she has not had any recent falls and explains she is well supported by her husband [name] and family. Patient did not have any further questions or concerns today and did confirm that she has this nurse's contact number to call her if needed.   Encounter Medications: Outpatient Encounter Medications as of 03/10/2020  Medication Sig  . albuterol (PROVENTIL) (2.5 MG/3ML) 0.083% nebulizer solution Take 3 mLs (2.5 mg total) by nebulization every 6 (six) hours as needed for wheezing or shortness of breath.  Marland Kitchen amLODipine (NORVASC) 5 MG tablet TAKE 1 TABLET BY MOUTH EVERY DAY IN THE EVENING (Patient taking differently: Take 5 mg by mouth daily. TAKE 1 TABLET BY MOUTH EVERY DAY IN THE EVENING)  . atorvastatin (LIPITOR) 20 MG tablet TAKE 1 TABLET BY MOUTH DAILY AT 6 PM  . Certolizumab Pegol (CIMZIA Mathews) Inject into the skin every 14 (fourteen) days.  Stasia Cavalier (EUCRISA) 2 % OINT Apply to aa's BID PRN flares.  . dabigatran (PRADAXA) 150 MG CAPS capsule Take 1 capsule (150 mg total) by mouth 2 (two) times daily.  . fluconazole (DIFLUCAN) 100 MG tablet Take 1 tablet (100 mg total) by mouth daily. Repeat every few weeks as needed  . gabapentin (NEURONTIN) 300 MG capsule Take 1 capsule (300 mg total) by  mouth 4 (four) times daily.  . hydroxychloroquine (PLAQUENIL) 200 MG tablet Take 200 mg by mouth 2 (two) times daily.   Marland Kitchen ketoconazole (NIZORAL) 2 % cream Apply 1 application topically 2 (two) times daily.  Marland Kitchen ketoconazole (NIZORAL) 2 % cream Apply 1 application topically in the morning and at bedtime.  Marland Kitchen leflunomide (ARAVA) 20 MG tablet Take 20 mg by mouth every morning.   . meclizine (ANTIVERT) 25 MG tablet TAKE 1 TABLET(25 MG) BY MOUTH THREE TIMES DAILY AS NEEDED FOR DIZZINESS (Patient taking differently: Take 25 mg by mouth 3 (three) times daily as needed for dizziness.)  . metoprolol succinate (TOPROL-XL) 50 MG 24 hr tablet TAKE 1 TABLET BY MOUTH EVERY DAY WITH OR IMMEDIATELY FOLLOWING A MEAL  . montelukast (SINGULAIR) 10 MG tablet TAKE 1 TABLET BY MOUTH AT BEDTIME  . nystatin (MYCOSTATIN/NYSTOP) powder Apply 1 application topically 3 (three) times daily. (Patient taking differently: Apply 1 application topically 3 (three) times daily as needed.)  . Oxycodone HCl 20 MG TABS Take 1 tablet (20 mg total) by mouth in the morning and at bedtime. Substitute for prev rx  . polyethylene glycol (MIRALAX / GLYCOLAX) packet Take 17 g by mouth daily as needed for mild constipation.  . potassium chloride SA (KLOR-CON) 20 MEQ tablet Take 0.5 tablets (10 mEq total) by mouth 2 (two) times daily.  . predniSONE (DELTASONE) 10 MG tablet Take 2 tablets (20 mg total) by mouth daily with breakfast. Wean as directed  . SF 5000 PLUS 1.1 %  CREA dental cream Place 1 application onto teeth 2 (two) times daily.  Marland Kitchen spironolactone (ALDACTONE) 25 MG tablet Take 1 tablet (25 mg total) by mouth daily.  Marland Kitchen terazosin (HYTRIN) 10 MG capsule TAKE 1 CAPSULE(10 MG) BY MOUTH AT BEDTIME (Patient taking differently: Take 10 mg by mouth at bedtime.)  . torsemide (DEMADEX) 20 MG tablet Take 2 tablets (40 mg total) by mouth daily. (Patient taking differently: Take 40 mg by mouth in the morning and at bedtime. Take twice daily when weight is  up.)   No facility-administered encounter medications on file as of 03/10/2020.    Functional Status:  In your present state of health, do you have any difficulty performing the following activities: 01/28/2020 01/19/2020  Hearing? N N  Vision? N N  Difficulty concentrating or making decisions? N N  Walking or climbing stairs? Y N  Dressing or bathing? N N  Doing errands, shopping? Y N  Comment Husband helps -  Preparing Food and eating ? - -  Using the Toilet? - -  In the past six months, have you accidently leaked urine? - -  Comment - -  Do you have problems with loss of bowel control? - -  Managing your Medications? - -  Managing your Finances? - -  Comment - -  Housekeeping or managing your Housekeeping? - -  Comment - -  Some recent data might be hidden    Fall/Depression Screening: Fall Risk  03/10/2020 03/10/2020 01/19/2020  Falls in the past year? '1 1 1  ' Comment - - -  Number falls in past yr: '1 1 1  ' Injury with Fall? 0 0 0  Risk Factor Category  - - -  Risk for fall due to : Impaired mobility;Impaired balance/gait;History of fall(s) Impaired mobility;Impaired balance/gait;History of fall(s) History of fall(s);Impaired balance/gait  Risk for fall due to: Comment - - -  Follow up Falls prevention discussed;Education provided;Falls evaluation completed Falls prevention discussed;Education provided;Falls evaluation completed Falls evaluation completed   PHQ 2/9 Scores 11/27/2019 08/28/2019 07/07/2019 04/16/2019 03/20/2018 02/15/2017 11/16/2016  PHQ - 2 Score '3 1 1 ' 0 - 0 0  PHQ- 9 Score 11 - - - - 0 -  Exception Documentation - - - - Medical reason - -    Assessment:  Goals Addressed            This Visit's Progress   . Eat Healthy       Timeframe:  Long-Range Goal Priority:  High Start Date:  01/08/20                           Expected End Date:  08/26/20                     Follow Up Date 06/26/20   - change to whole grain breads, cereal, pasta - limit fast food  meals to no more than 1 per week - manage portion size - read food labels for fat, fiber, carbohydrates and portion size - set a realistic goal    Why is this important?   When you are ready to manage your nutrition or weight, having a plan and setting goals will help.  Taking small steps to change how you eat and exercise is a good place to start.    Notes: Patient states she will makes healthier choices and attempt to reduce sodium intake and snacking on junk. Patient will work with husband to compile  a grocery list of healthier food choices  Updated: 01/08/20    . Lifestyle Change       Timeframe:  Long-Range Goal Priority:  High Start Date: 01/08/20                            Expected End Date:  08/26/20                    Follow Up Date 06/26/20   - agree on reward when goals are met - agree to work together to make changes - ask questions to understand    Why is this important?   The changes that you are asked to make may be hard to do.  This is especially true when the changes are life-long.  Knowing why it is important to you is the first step.  Working on the change with your family or support person helps you not feel alone.  Reward yourself and family or support person when goals are met. This can be an activity you choose like bowling, hiking, biking, swimming or shooting hoops.     Notes: Patient has goal of improving health so she can care for another dog. Patient also wants to be able to get into her swimming pool to enjoy and walk for exercise. Patient does get up more often to accomplish small tasks around the house. She does  leg lifts and lifts a 5 pound weight with her arms to help her gain strength when she is watching television.      . COMPLETED: Make and Keep All Appointments        Follow Up Date 01/08/20   - ask family or friend for a ride - call to cancel if needed - keep a calendar with prescription refill dates - keep a calendar with appointment  dates    Why is this important?   Part of staying healthy is seeing the doctor for follow-up care.  If you forget your appointments, there are some things you can do to stay on track.    Notes: Patient states that she is able to keep up with her provider appointments and her husband is able to provide transportation  Updated and completed on 01/08/20    . COMPLETED: Manage My Medicine       Timeframe:  Long-Range Goal Priority:  High Start Date: 01/08/20                            Expected End Date: 03/10/20                     Follow Up Date 03/10/20   - call for medicine refill 2 or 3 days before it runs out - call if I am sick and can't take my medicine - keep a list of all the medicines I take; vitamins and herbals too - learn to read medicine labels    Why is this important?   These steps will help you keep on track with your medicines.    Notes: Patient reports taking all of her medications daily and as prescribed. She denies needing any assistance with managing her medications.    . Track and Manage Fluids and Swelling       Timeframe:  Long-Range Goal Priority:  High Start Date:   01/08/20  Expected End Date: 08/26/20                      Follow Up Date 06/26/20   - call office if I gain more than 2 pounds in one day or 5 pounds in one week - keep legs up while sitting - track weight in diary - use salt in moderation - watch for swelling in feet, ankles and legs every day    Why is this important?   It is important to check your weight daily and watch how much salt and liquids you have.  It will help you to manage your heart failure.    Notes: Patient reports that she is weighing herself daily and recording the values. Her cardiologist has her take an additional Torsemide if she gains weight.    . Track and Manage Symptoms       Timeframe:  Long-Range Goal Priority:  High Start Date:  01/08/20                           Expected End Date:   08/26/20                    Follow Up Date 06/26/20   - begin a heart failure diary - bring diary to all appointments - develop a rescue plan - eat more whole grains, fruits and vegetables, lean meats and healthy fats - follow rescue plan if symptoms flare-up - know when to call the doctor - track symptoms and what helps feel better or worse    Why is this important?   You will be able to handle your symptoms better if you keep track of them.  Making some simple changes to your lifestyle will help.  Eating healthy is one thing you can do to take good care of yourself.    Notes: Patient states that she does call the doctor for an appointment when she is feeling symptomatic. Patient states she is more motivated now after her recent heart failure hospital admission. She received education from a cardiology nurse which connected to patient. Per patient she did receive education about CHF zones and actions plans. Patient does take the prescribed Torsemide medication daily, she is decreasing the amount of sodium she consumes, and is doing chair exercises to help gain strength and mobility.      Plan: RN Health Coach will call patient within the month of April. Follow-up:  Patient agrees to Care Plan and Follow-up.  Emelia Loron RN, BSN [place] [phone] [email or web address]

## 2020-03-10 NOTE — Patient Instructions (Addendum)
Goals Addressed            This Visit's Progress   . Eat Healthy       Timeframe:  Long-Range Goal Priority:  High Start Date:  01/08/20                           Expected End Date:  08/26/20                     Follow Up Date 06/26/20   - change to whole grain breads, cereal, pasta - limit fast food meals to no more than 1 per week - manage portion size - read food labels for fat, fiber, carbohydrates and portion size - set a realistic goal    Why is this important?   When you are ready to manage your nutrition or weight, having a plan and setting goals will help.  Taking small steps to change how you eat and exercise is a good place to start.    Notes: Patient states she will makes healthier choices and attempt to reduce sodium intake and snacking on junk. Patient will work with husband to compile a grocery list of healthier food choices  Updated: 01/08/20    . Lifestyle Change       Timeframe:  Long-Range Goal Priority:  High Start Date: 01/08/20                            Expected End Date:  08/26/20                    Follow Up Date 06/26/20   - agree on reward when goals are met - agree to work together to make changes - ask questions to understand    Why is this important?   The changes that you are asked to make may be hard to do.  This is especially true when the changes are life-long.  Knowing why it is important to you is the first step.  Working on the change with your family or support person helps you not feel alone.  Reward yourself and family or support person when goals are met. This can be an activity you choose like bowling, hiking, biking, swimming or shooting hoops.     Notes: Patient has goal of improving health so she can care for another dog. Patient also wants to be able to get into her swimming pool to enjoy and walk for exercise. Patient does get up more often to accomplish small tasks around the house. She does  leg lifts and lifts a 5 pound  weight with her arms to help her gain strength when she is watching television.      . COMPLETED: Make and Keep All Appointments        Follow Up Date 01/08/20   - ask family or friend for a ride - call to cancel if needed - keep a calendar with prescription refill dates - keep a calendar with appointment dates    Why is this important?   Part of staying healthy is seeing the doctor for follow-up care.  If you forget your appointments, there are some things you can do to stay on track.    Notes: Patient states that she is able to keep up with her provider appointments and her husband is able to provide transportation  Updated and completed on 01/08/20    .  COMPLETED: Manage My Medicine       Timeframe:  Long-Range Goal Priority:  High Start Date: 01/08/20                            Expected End Date: 03/10/20                     Follow Up Date 03/10/20   - call for medicine refill 2 or 3 days before it runs out - call if I am sick and can't take my medicine - keep a list of all the medicines I take; vitamins and herbals too - learn to read medicine labels    Why is this important?   These steps will help you keep on track with your medicines.    Notes: Patient reports taking all of her medications daily and as prescribed. She denies needing any assistance with managing her medications.    . Track and Manage Fluids and Swelling       Timeframe:  Long-Range Goal Priority:  High Start Date:   01/08/20                         Expected End Date: 08/26/20                      Follow Up Date 06/26/20   - call office if I gain more than 2 pounds in one day or 5 pounds in one week - keep legs up while sitting - track weight in diary - use salt in moderation - watch for swelling in feet, ankles and legs every day    Why is this important?   It is important to check your weight daily and watch how much salt and liquids you have.  It will help you to manage your heart failure.     Notes: Patient reports that she is weighing herself daily and recording the values. Her cardiologist has her take an additional Torsemide if she gains weight.    . Track and Manage Symptoms       Timeframe:  Long-Range Goal Priority:  High Start Date:  01/08/20                           Expected End Date:  08/26/20                    Follow Up Date 06/26/20   - begin a heart failure diary - bring diary to all appointments - develop a rescue plan - eat more whole grains, fruits and vegetables, lean meats and healthy fats - follow rescue plan if symptoms flare-up - know when to call the doctor - track symptoms and what helps feel better or worse    Why is this important?   You will be able to handle your symptoms better if you keep track of them.  Making some simple changes to your lifestyle will help.  Eating healthy is one thing you can do to take good care of yourself.    Notes: Patient states that she does call the doctor for an appointment when she is feeling symptomatic. Patient states she is more motivated now after her recent heart failure hospital admission. She received education from a cardiology nurse which connected to patient. Per patient she did receive education about CHF zones and actions plans.  Patient does take the prescribed Torsemide medication daily, she is decreasing the amount of sodium she consumes, and is doing chair exercises to help gain strength and mobility.

## 2020-03-17 ENCOUNTER — Ambulatory Visit: Payer: Medicare Other | Admitting: Internal Medicine

## 2020-03-18 DIAGNOSIS — M0579 Rheumatoid arthritis with rheumatoid factor of multiple sites without organ or systems involvement: Secondary | ICD-10-CM | POA: Diagnosis not present

## 2020-03-22 ENCOUNTER — Other Ambulatory Visit: Payer: Self-pay

## 2020-03-22 ENCOUNTER — Ambulatory Visit (INDEPENDENT_AMBULATORY_CARE_PROVIDER_SITE_OTHER): Payer: Medicare Other | Admitting: Dermatology

## 2020-03-22 DIAGNOSIS — X12XXXA Contact with other hot fluids, initial encounter: Secondary | ICD-10-CM

## 2020-03-22 DIAGNOSIS — T2120XA Burn of second degree of trunk, unspecified site, initial encounter: Secondary | ICD-10-CM

## 2020-03-22 DIAGNOSIS — T2121XA Burn of second degree of chest wall, initial encounter: Secondary | ICD-10-CM

## 2020-03-22 DIAGNOSIS — T1490XA Injury, unspecified, initial encounter: Secondary | ICD-10-CM | POA: Diagnosis not present

## 2020-03-22 DIAGNOSIS — T2122XA Burn of second degree of abdominal wall, initial encounter: Secondary | ICD-10-CM | POA: Diagnosis not present

## 2020-03-22 MED ORDER — DOXYCYCLINE MONOHYDRATE 100 MG PO CAPS: 100.0000 mg | ORAL_CAPSULE | Freq: Two times a day (BID) | ORAL | 0 refills | Status: AC

## 2020-03-22 MED ORDER — MUPIROCIN 2 % EX OINT: 1.0000 "application " | TOPICAL_OINTMENT | Freq: Two times a day (BID) | CUTANEOUS | 3 refills | Status: DC
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
## 2020-03-24 ENCOUNTER — Encounter: Payer: Self-pay | Admitting: Internal Medicine

## 2020-03-24 ENCOUNTER — Ambulatory Visit (INDEPENDENT_AMBULATORY_CARE_PROVIDER_SITE_OTHER): Payer: Medicare Other | Admitting: Internal Medicine

## 2020-03-24 ENCOUNTER — Other Ambulatory Visit: Payer: Self-pay

## 2020-03-24 DIAGNOSIS — M353 Polymyalgia rheumatica: Secondary | ICD-10-CM | POA: Diagnosis not present

## 2020-03-24 DIAGNOSIS — F1120 Opioid dependence, uncomplicated: Secondary | ICD-10-CM

## 2020-03-24 MED ORDER — PREDNISONE 5 MG PO TABS
15.0000 mg | ORAL_TABLET | Freq: Every day | ORAL | 5 refills | Status: DC
Start: 2020-03-24 — End: 2020-06-29

## 2020-03-24 NOTE — Assessment & Plan Note (Signed)
Clear improvement immediately on the prednisone She is anxious to wean off so will try to get her down quickly Instructions given

## 2020-03-24 NOTE — Assessment & Plan Note (Signed)
Oxycodone for chronic pain Reviewed PDMP--no concerns

## 2020-03-24 NOTE — Patient Instructions (Signed)
Please start taking 15 mg of prednisone daily on even days, and 20mg  on odd days. Continue this for 2 weeks---and if your muscle aching hasn't worsened--you can reduce it to 15 mg every day. Wait another 2-3 weeks, and then you can decrease to 10mg  alternating with 15mg  and finally 10mg  daily a few weeks later.  If the pain worsens at any time, go back to the previous dose where you had no symptoms.
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
## 2020-03-24 NOTE — Assessment & Plan Note (Signed)
Has gained back a little weight on prednisone BMI 55 Will try to wean this quickly

## 2020-04-01 ENCOUNTER — Other Ambulatory Visit: Payer: Self-pay | Admitting: Internal Medicine

## 2020-04-01 MED ORDER — OXYCODONE HCL 20 MG PO TABS: 1.0000 | ORAL_TABLET | Freq: Two times a day (BID) | ORAL | 0 refills | Status: DC

## 2020-04-01 NOTE — Telephone Encounter (Signed)
Patient needs a refill on her oxycodone  Pharmacy: [facility]

## 2020-04-01 NOTE — Addendum Note (Signed)
Addended by: Pilar Grammes on: 04/01/2020 10:46 AM   Modules accepted: Orders

## 2020-04-01 NOTE — Addendum Note (Signed)
Addended by: Viviana Simpler I on: 04/01/2020 01:52 PM   Modules accepted: Orders

## 2020-04-01 NOTE — Telephone Encounter (Signed)
Name of Medication:oxycodone 10 mg Name of Pharmacy:[facility] [address] or Written Date and Quantity:02-03-20 #120 Last Office Visit and Type:03-24-20 Next Office Visit and Type: 06-22-20 Last Controlled Substance Agreement Date:05/23/19 Last UDS:05/14/19

## 2020-04-05 ENCOUNTER — Other Ambulatory Visit: Payer: Self-pay

## 2020-04-05 ENCOUNTER — Ambulatory Visit (INDEPENDENT_AMBULATORY_CARE_PROVIDER_SITE_OTHER): Payer: Medicare Other | Admitting: Dermatology

## 2020-04-05 DIAGNOSIS — T2122XD Burn of second degree of abdominal wall, subsequent encounter: Secondary | ICD-10-CM | POA: Diagnosis not present

## 2020-04-05 DIAGNOSIS — T2121XD Burn of second degree of chest wall, subsequent encounter: Secondary | ICD-10-CM

## 2020-04-05 DIAGNOSIS — T1490XA Injury, unspecified, initial encounter: Secondary | ICD-10-CM

## 2020-04-05 DIAGNOSIS — T25212D Burn of second degree of left ankle, subsequent encounter: Secondary | ICD-10-CM
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
## 2020-04-07 ENCOUNTER — Ambulatory Visit: Payer: Medicare Other | Admitting: Family

## 2020-04-15 DIAGNOSIS — M0579 Rheumatoid arthritis with rheumatoid factor of multiple sites without organ or systems involvement: Secondary | ICD-10-CM | POA: Diagnosis not present

## 2020-04-19 ENCOUNTER — Ambulatory Visit (INDEPENDENT_AMBULATORY_CARE_PROVIDER_SITE_OTHER): Payer: Medicare Other | Admitting: Dermatology

## 2020-04-19 ENCOUNTER — Other Ambulatory Visit: Payer: Self-pay

## 2020-04-19 DIAGNOSIS — T1490XA Injury, unspecified, initial encounter: Secondary | ICD-10-CM | POA: Diagnosis not present

## 2020-04-19 DIAGNOSIS — T2122XD Burn of second degree of abdominal wall, subsequent encounter: Secondary | ICD-10-CM

## 2020-04-19 DIAGNOSIS — T25212D Burn of second degree of left ankle, subsequent encounter: Secondary | ICD-10-CM

## 2020-04-19 DIAGNOSIS — T2121XD Burn of second degree of chest wall, subsequent encounter: Secondary | ICD-10-CM
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
## 2020-04-24 ENCOUNTER — Other Ambulatory Visit: Payer: Self-pay | Admitting: Physician Assistant

## 2020-04-26 DIAGNOSIS — M0579 Rheumatoid arthritis with rheumatoid factor of multiple sites without organ or systems involvement: Secondary | ICD-10-CM | POA: Diagnosis not present

## 2020-04-26 DIAGNOSIS — Z79899 Other long term (current) drug therapy: Secondary | ICD-10-CM | POA: Diagnosis not present

## 2020-04-26 DIAGNOSIS — M1A00X0 Idiopathic chronic gout, unspecified site, without tophus (tophi): Secondary | ICD-10-CM | POA: Diagnosis not present

## 2020-04-28 ENCOUNTER — Other Ambulatory Visit: Payer: Self-pay | Admitting: Internal Medicine

## 2020-04-28 ENCOUNTER — Other Ambulatory Visit: Payer: Self-pay

## 2020-04-28 NOTE — Addendum Note (Signed)
Addended by: Pilar Grammes on: 04/28/2020 03:58 PM   Modules accepted: Orders

## 2020-04-28 NOTE — Telephone Encounter (Signed)
*  STAT* If patient is at the pharmacy, call can be transferred to refill team.   1. Which medications need to be refilled? (please list name of each medication and dose if known) Pradaxa  2. Which pharmacy/location (including street and city if local pharmacy) is medication to be sent to? [facility]   3. Do they need a 30 day or 90 day supply? [place]

## 2020-04-28 NOTE — Telephone Encounter (Signed)
Patient is needing a refill on her oxycodone  Pharmacy [facility] [address]

## 2020-04-28 NOTE — Telephone Encounter (Signed)
Name of Medication:oxycodone 10 mg Name of Pharmacy:[facility] [address] [place] or Written Date and Quantity:04-01-20 #120 Last Office Visit and Type:03-24-20 Next Office Visit and Type:06-22-20 Last Controlled Substance Agreement Date:05/23/19 Last UDS:05/14/19

## 2020-04-29 MED ORDER — OXYCODONE HCL 20 MG PO TABS: 1.0000 | ORAL_TABLET | Freq: Two times a day (BID) | ORAL | 0 refills | Status: DC

## 2020-04-29 NOTE — Addendum Note (Signed)
Addended by: Viviana Simpler I on: 04/29/2020 05:04 PM   Modules accepted: Orders

## 2020-04-30 ENCOUNTER — Other Ambulatory Visit: Payer: Self-pay | Admitting: Cardiovascular Disease

## 2020-04-30 MED ORDER — POTASSIUM CHLORIDE CRYS ER 20 MEQ PO TBCR
10.0000 meq | EXTENDED_RELEASE_TABLET | Freq: Two times a day (BID) | ORAL | 0 refills | Status: DC
Start: 2020-04-30 — End: 2020-07-01

## 2020-04-30 NOTE — Telephone Encounter (Signed)
Requested Prescriptions   Pending Prescriptions Disp Refills  . dabigatran (PRADAXA) 150 MG CAPS capsule 180 capsule 0    Sig: Take 1 capsule (150 mg total) by mouth 2 (two) times daily.   Signed Prescriptions Disp Refills  . potassium chloride SA (KLOR-CON) 20 MEQ tablet 90 tablet 0    Sig: Take 0.5 tablets (10 mEq total) by mouth 2 (two) times daily.    Authorizing Provider: Minna Merritts    Ordering User: Britt Bottom

## 2020-04-30 NOTE — Telephone Encounter (Signed)
*  STAT* If patient is at the pharmacy, call can be transferred to refill team.   1. Which medications need to be refilled? (please list name of each medication and dose if known)   Potassium 10 mEq po BID ( out now )  [facility]   Pradaxa  150 mg po BID - [facility]  May need Prior Auth   2. Which pharmacy/location (including street and city if local pharmacy) is medication to be sent to?   [facility] [address] [facility]   3. Do they need a 30 day or 90 day supply? [place]

## 2020-05-03 ENCOUNTER — Other Ambulatory Visit: Payer: Self-pay

## 2020-05-03 ENCOUNTER — Ambulatory Visit: Payer: Medicare Other | Attending: Family | Admitting: Family

## 2020-05-03 ENCOUNTER — Encounter: Payer: Self-pay | Admitting: Family

## 2020-05-03 ENCOUNTER — Telehealth: Payer: Self-pay | Admitting: Cardiovascular Disease

## 2020-05-03 VITALS — BP 138/72 | HR 62 | Resp 18 | Ht 63.0 in | Wt 313.0 lb

## 2020-05-03 DIAGNOSIS — J449 Chronic obstructive pulmonary disease, unspecified: Secondary | ICD-10-CM | POA: Diagnosis not present

## 2020-05-03 DIAGNOSIS — I110 Hypertensive heart disease with heart failure: Secondary | ICD-10-CM | POA: Insufficient documentation

## 2020-05-03 DIAGNOSIS — I2510 Atherosclerotic heart disease of native coronary artery without angina pectoris: Secondary | ICD-10-CM | POA: Diagnosis not present

## 2020-05-03 DIAGNOSIS — M069 Rheumatoid arthritis, unspecified: Secondary | ICD-10-CM | POA: Insufficient documentation

## 2020-05-03 DIAGNOSIS — I5032 Chronic diastolic (congestive) heart failure: Secondary | ICD-10-CM | POA: Insufficient documentation

## 2020-05-03 DIAGNOSIS — R0602 Shortness of breath: Secondary | ICD-10-CM | POA: Diagnosis not present

## 2020-05-03 DIAGNOSIS — R42 Dizziness and giddiness: Secondary | ICD-10-CM | POA: Diagnosis not present

## 2020-05-03 DIAGNOSIS — G8929 Other chronic pain: Secondary | ICD-10-CM | POA: Insufficient documentation

## 2020-05-03 DIAGNOSIS — I890 Lymphedema, not elsewhere classified: Secondary | ICD-10-CM | POA: Diagnosis not present

## 2020-05-03 DIAGNOSIS — Z79899 Other long term (current) drug therapy: Secondary | ICD-10-CM | POA: Insufficient documentation

## 2020-05-03 DIAGNOSIS — E785 Hyperlipidemia, unspecified: Secondary | ICD-10-CM | POA: Insufficient documentation

## 2020-05-03 DIAGNOSIS — R531 Weakness: Secondary | ICD-10-CM | POA: Insufficient documentation

## 2020-05-03 DIAGNOSIS — Z7901 Long term (current) use of anticoagulants: Secondary | ICD-10-CM | POA: Insufficient documentation

## 2020-05-03 DIAGNOSIS — I480 Paroxysmal atrial fibrillation: Secondary | ICD-10-CM | POA: Insufficient documentation

## 2020-05-03 DIAGNOSIS — I10 Essential (primary) hypertension: Secondary | ICD-10-CM

## 2020-05-03 DIAGNOSIS — Z7952 Long term (current) use of systemic steroids: Secondary | ICD-10-CM | POA: Diagnosis not present

## 2020-05-03 MED ORDER — DABIGATRAN ETEXILATE MESYLATE 150 MG PO CAPS
150.0000 mg | ORAL_CAPSULE | Freq: Two times a day (BID) | ORAL | 0 refills | Status: DC
Start: 2020-05-03 — End: 2020-05-07

## 2020-05-03 NOTE — Patient Instructions (Addendum)
Continue weighing daily and call for an overnight weight gain of > 2 pounds or a weekly weight gain of >5 pounds.   Call us in the future if you'd like to schedule another appointment 

## 2020-05-03 NOTE — Telephone Encounter (Signed)
48f, 142kg, scr 1.05 02/03/20, ccr 112, lovw/gollan 02/24/20

## 2020-05-03 NOTE — Progress Notes (Signed)
Patient ID: Tina Pacheco, female    DOB: [birth year], 70 y.o.   MRN: [id number]  HPI  Tina Pacheco is a 70 y/o female with a history of asthma, HTN, dyslipidemia, PAF, rhaumatoid arthritis, depression, COPD, sleep apnea, claustrophobia and chronic heart failure.   Echo report from 01/06/20 reviewed and showed an EF of 60-65% along with mld LVH.  Admitted 01/28/20 due to weakness. Given oral steroids for joint pain and symptoms improved. Discharged the following day. Admitted 01/05/20 due to HF exacerbation after not taking her diuretics for previous 3 days. Initially given IV lasix with transition to oral diuretics with subsequent 5.3L loss of fluids. Discharged the following day.   She presents today for a follow-up visit with a chief complaint of moderate fatigue upon minimal exertion. She describes this as chronic in nature having been present for several years. She has associated shortness of breath, pedal edema, chronic pain, dizziness, weakness and fluctuating weight along with this. She denies any difficulty sleeping, abdominal distention, palpitations, chest pain or cough.   Feels like her rheumatoid pain is getting under control.   Past Medical History:  Diagnosis Date  . Acute CHF (congestive heart failure) ([place]) 03/30/2019  . Arthritis    RA  . Asthma   . Basal cell carcinoma   . Cataract 2018   bilateral eyes; corrected with surgery  . Claustrophobia   . Collagen vascular disease (HCC)    RA  . COPD (chronic obstructive pulmonary disease) ([place])   . Diastolic dysfunction    a. echo 07/2014: EF 55-60%, no RWMA, GR2DD, mild MR, LA moderately dilated, PASP 38 mm Hg  . Dyslipidemia   . Dysrhythmia   . Headache    migraines  . Hemihypertrophy   . History of cardiac cath    a. 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 History of gout   . Hyperplastic colonic polyp 2003  . Hypertension   . Hypokalemia   . Morbid obesity ([place])   . PAF (paroxysmal atrial fibrillation)  (HCC)    a. on Pradaxa; b. CHADSVASc at least 2 (HTN & female)  . Rheumatoid arthritis(714.0)   . Sleep apnea    a. not compliant with CPAP   Past Surgical History:  Procedure Laterality Date  . ABDOMINAL HYSTERECTOMY    . APPLICATION OF WOUND VAC Right 08/09/2017   Procedure: APPLICATION OF WOUND VAC;  Surgeon: Albertine Patricia, DPM;  Location: [facility];  Service: Podiatry;  Laterality: Right;  . CARDIAC CATHETERIZATION  05/24/2010   nonobstructive CAD  . CATARACT EXTRACTION W/ INTRAOCULAR LENS IMPLANT Right 06/12/2016   Dr. Darleen Crocker  . CATARACT EXTRACTION W/ INTRAOCULAR LENS IMPLANT Left 06/26/2016   Dr. Darleen Crocker  . CESAREAN SECTION    . CHOLECYSTECTOMY    . COLONOSCOPY  06/12/2011   Procedure: COLONOSCOPY;  Surgeon: Juanita Craver, MD;  Location: [facility];  Service: Endoscopy;  Laterality: N/A;  . COLONOSCOPY N/A 03/17/2013   Procedure: COLONOSCOPY;  Surgeon: Juanita Craver, MD;  Location: [facility];  Service: Endoscopy;  Laterality: N/A;  . EYE SURGERY    . FOOT ARTHRODESIS Right 07/13/2017   Procedure: ARTHRODESIS FOOT-MULTI.FUSIONS (6 JOINTS);  Surgeon: Albertine Patricia, DPM;  Location: [facility];  Service: Podiatry;  Laterality: Right;  . IRRIGATION AND DEBRIDEMENT FOOT Right 08/09/2017   Procedure: IRRIGATION AND DEBRIDEMENT FOOT;  Surgeon: Albertine Patricia, DPM;  Location: [facility];  Service: Podiatry;  Laterality: Right;  . KNEE ARTHROSCOPY     bilateral  .  SINUSOTOMY    . TOOTH EXTRACTION  12/2016  . VAGINAL HYSTERECTOMY     Family History  Problem Relation Age of Onset  . Emphysema Father        smoked  . Tuberculosis Mother   . Parkinsonism Mother   . Diabetes type II Sister   . Breast cancer Sister   . Breast cancer Maternal Aunt   . Tuberculosis Sister    Social History   Tobacco Use  . Smoking status: Never Smoker  . Smokeless tobacco: Never Used  Substance Use Topics  . Alcohol use: No   Allergies  Allergen Reactions  . Fluoxetine      Headache, shaking, sleep issues  . Codeine     Nausea and vomiting/only when taking too much   Prior to Admission medications   Medication Sig Start Date End Date Taking? Authorizing Provider  albuterol (PROVENTIL) (2.5 MG/3ML) 0.083% nebulizer solution Take 3 mLs (2.5 mg total) by nebulization every 6 (six) hours as needed for wheezing or shortness of breath. 01/08/20  Yes Viviana Simpler I, MD  amLODipine (NORVASC) 5 MG tablet TAKE 1 TABLET BY MOUTH EVERY DAY IN THE EVENING Patient taking differently: Take 5 mg by mouth daily. TAKE 1 TABLET BY MOUTH EVERY DAY IN THE EVENING 09/04/19  Yes Viviana Simpler I, MD  atorvastatin (LIPITOR) 20 MG tablet TAKE 1 TABLET BY MOUTH DAILY AT 6 PM 01/25/20  Yes Venia Carbon, MD  Certolizumab Pegol [facility]) Inject into the skin every 30 (thirty) days.   Yes [provider]  Crisaborole (EUCRISA) 2 % OINT Apply to aa's BID PRN flares. 02/10/20  Yes Brendolyn Patty, MD  dabigatran (PRADAXA) 150 MG CAPS capsule Take 1 capsule (150 mg total) by mouth 2 (two) times daily. 05/03/20  Yes Minna Merritts, MD  fluconazole (DIFLUCAN) 100 MG tablet Take 1 tablet (100 mg total) by mouth daily. Repeat every few weeks as needed 01/14/20  Yes Venia Carbon, MD  gabapentin (NEURONTIN) 300 MG capsule Take 1 capsule (300 mg total) by mouth 4 (four) times daily. 01/20/20  Yes Venia Carbon, MD  ketoconazole (NIZORAL) 2 % cream Apply 1 application topically 2 (two) times daily. 12/30/19  Yes [provider]  leflunomide (ARAVA) 20 MG tablet Take 20 mg by mouth daily.   Yes [provider]  meclizine (ANTIVERT) 25 MG tablet TAKE 1 TABLET(25 MG) BY MOUTH THREE TIMES DAILY AS NEEDED FOR DIZZINESS Patient taking differently: Take 25 mg by mouth 3 (three) times daily as needed for dizziness. 10/20/19  Yes Viviana Simpler I, MD  metoprolol succinate (TOPROL-XL) 50 MG 24 hr tablet TAKE 1 TABLET BY MOUTH EVERY DAY WITH OR IMMEDIATELY FOLLOWING A MEAL  01/25/20  Yes Viviana Simpler I, MD  montelukast (SINGULAIR) 10 MG tablet TAKE 1 TABLET BY MOUTH AT BEDTIME 02/02/20  Yes Venia Carbon, MD  mupirocin ointment (BACTROBAN) 2 % Apply 1 application topically 2 (two) times daily. Qd to burn wounds 03/22/20  Yes Brendolyn Patty, MD  nystatin (MYCOSTATIN/NYSTOP) powder Apply 1 application topically 3 (three) times daily. Patient taking differently: Apply 1 application topically 3 (three) times daily as needed. 01/08/20  Yes Venia Carbon, MD  Oxycodone HCl 20 MG TABS Take 1 tablet (20 mg total) by mouth in the morning and at bedtime. Substitute for prev rx 04/29/20  Yes Venia Carbon, MD  polyethylene glycol (MIRALAX / GLYCOLAX) packet Take 17 g by mouth daily as needed for mild constipation.  07/16/17  Yes Sainani, Belia Heman, MD  potassium chloride SA (KLOR-CON) 20 MEQ tablet Take 0.5 tablets (10 mEq total) by mouth 2 (two) times daily. 04/30/20  Yes Minna Merritts, MD  predniSONE (DELTASONE) 5 MG tablet Take 3-4 tablets (15-20 mg total) by mouth daily with breakfast. Wean as directed 03/24/20  Yes Venia Carbon, MD  SF 5000 PLUS 1.1 % CREA dental cream Place 1 application onto teeth 2 (two) times daily. 06/01/17  Yes [provider]  spironolactone (ALDACTONE) 25 MG tablet Take 1 tablet (25 mg total) by mouth daily. 01/14/20  Yes Venia Carbon, MD  terazosin (HYTRIN) 10 MG capsule TAKE 1 CAPSULE(10 MG) BY MOUTH AT BEDTIME Patient taking differently: Take 10 mg by mouth at bedtime. 01/03/20  Yes Venia Carbon, MD  torsemide (DEMADEX) 20 MG tablet Take 2 tablets (40 mg total) by mouth daily. Patient taking differently: Take 40 mg by mouth in the morning and at bedtime. Take twice daily when weight is up. 01/14/20  Yes Venia Carbon, MD    Review of Systems  Constitutional: Positive for fatigue. Negative for appetite change.  HENT: Positive for congestion. Negative for rhinorrhea and sore throat.   Eyes: Negative.    Respiratory: Positive for shortness of breath. Negative for cough.   Cardiovascular: Positive for leg swelling. Negative for chest pain and palpitations.  Gastrointestinal: Negative for abdominal distention and abdominal pain.  Endocrine: Negative.   Genitourinary: Positive for urgency.  Musculoskeletal: Positive for arthralgias (shoulders) and back pain. Negative for neck pain.  Skin: Positive for rash (folds of abdomen).  Allergic/Immunologic: Negative.   Neurological: Positive for dizziness (at times), weakness and headaches (at times). Negative for light-headedness.  Hematological: Negative for adenopathy. Does not bruise/bleed easily.  Psychiatric/Behavioral: Negative for agitation, dysphoric mood and sleep disturbance.   Vitals:   05/03/20 0958 05/03/20 1016  BP: (!) 173/86 138/72  Pulse: 62   Resp: 18   SpO2: 96%   Weight: (!) 313 lb (142 kg)   Height: 5\' 3"  (1.6 m)    Wt Readings from Last 3 Encounters:  05/03/20 (!) 313 lb (142 kg)  03/24/20 (!) 313 lb (142 kg)  02/24/20 (!) 308 lb 8 oz (139.9 kg)   Lab Results  Component Value Date   CREATININE 1.05 02/03/2020   CREATININE 0.77 01/29/2020   CREATININE 1.00 01/28/2020   Physical Exam Vitals and nursing note reviewed.  Constitutional:      Appearance: She is well-developed.  HENT:     Head: Normocephalic and atraumatic.  Neck:     Vascular: No JVD.  Cardiovascular:     Rate and Rhythm: Normal rate and regular rhythm.  Pulmonary:     Effort: Pulmonary effort is normal. No respiratory distress.     Breath sounds: No wheezing or rales.  Abdominal:     Palpations: Abdomen is soft.     Tenderness: There is no abdominal tenderness.  Musculoskeletal:     Cervical back: Neck supple.     Right lower leg: No tenderness. Edema (1+ pitting) present.     Left lower leg: Tenderness present. Edema (1+ pitting) present.  Skin:    General: Skin is warm and dry.  Neurological:     General: No focal deficit present.      Mental Status: She is alert and oriented to person, place, and time.  Psychiatric:        Mood and Affect: Mood normal.  Behavior: Behavior normal.    Assessment & Plan:  1: Chronic heart failure with preserved ejection fraction- - NYHA class III - euvolemic today - weighing daily and home weight chart reviewed; weight ranges from 308-313; reminded to call for an overnight weight gain of >2 pounds or a weekly weight gain of >5 pounds - not adding salt to her food - saw cardiology Rockey Situ) 02/24/20 - BNP 01/05/20 was 283.0 - reports receiving her flu vaccine for this season  2: HTN- - BP elevated initially (173/86) but had improved after rechecking with manual cuff at the end of the visit (138/72) - saw PCP Silvio Pate) 03/24/20 - BMP from 02/03/20 reviewed and showed sodium 138, potassium 3.8, creatinine 1.05 and GFR 54.03  3: Rheumatoid arthritis- - saw rheumatologist Posey Pronto) 04/26/20  4: Lymphedema- - stage 2 - limited in her ability to exercise due to her pain - elevates her legs when sitting in her recliner - unable to wear support socks because they are too tight and "cut into" her legs - discussed lymphedema clinic as well as compression boots but she would like to hold off on those; she says that when her weight is down the swelling isn't bad.  - advised that referrals for either of those things could be placed at anytime.    Patient did not bring her medications nor a list. Each medication was verbally reviewed with the patient and she was encouraged to bring the bottles to every visit to confirm accuracy of list.  Due to HF stability, will not make a return appointment for patient at this time. Advised patient that she needed to maintain close follow-up with cardiology but that she could call back at anytime to schedule another appointment and she was comfortable with this plan.

## 2020-05-03 NOTE — Telephone Encounter (Signed)
Dr Rockey Situ, Ok to switch to Xarelto?

## 2020-05-03 NOTE — Telephone Encounter (Signed)
Patient came by office  States that Pradaxa medication will no longer be covered by her insurance Would need to know other options that may be covered  Patient would prefer to not do coumadin  Please call to discuss

## 2020-05-04 NOTE — Telephone Encounter (Signed)
Able to return Tina Pacheco phone call, advised that pharmacy will send an automatic text message stating her Dabigatran (Pradaxa) is ready for pick up until she picks up the medication. Advised that Dr. Rockey Situ is aware of her insurance dropping coverage of Pradaxa and secure message sent to him and PhamD. PharmD is suggesting Xarelto, although Dr. Rockey Situ reports will need to review pt's chart and history to determine best substitution choice. Tina Pacheco is not wanting warfarin. Tina Pacheco grateful for return phone and explanation, will await phone call for updated medication change.

## 2020-05-04 NOTE — Telephone Encounter (Signed)
Patient calling back in to state she received a test stating a prescription was ready for her at the pharmacy and patient is confused as to what she would be taking. Patient states again that her insurance is not covering pradaxa, she needs dabigatran or the generic version NOT warfarin  Please advise

## 2020-05-07 MED ORDER — APIXABAN 5 MG PO TABS: 5.0000 mg | ORAL_TABLET | Freq: Two times a day (BID) | ORAL | 1 refills | Status: DC

## 2020-05-07 NOTE — Addendum Note (Signed)
Addended by: Marcelle Overlie D on: 05/07/2020 11:46 AM   Modules accepted: Orders

## 2020-05-07 NOTE — Telephone Encounter (Signed)
Recommend switching to Eliquis. I have sent in Rx to pharmacy Copay card activated RX BIN [id number] PCN [employer] GRP [id number] ID [id number]  Cost now $30. Called pt and made her aware. She was very appreciative of the help.

## 2020-05-17 ENCOUNTER — Other Ambulatory Visit: Payer: Self-pay | Admitting: Internal Medicine

## 2020-05-23 NOTE — Progress Notes (Signed)
Date:  05/24/2020   ID:  Tina Pacheco, Tina Pacheco [birth year], MRN [id number]  Patient Location:  [facility] [place] [address]   Provider location:   [facility], [place] office  PCP:  Venia Carbon, MD  Cardiologist:  Ida Rogue, MD   Chief Complaint  Patient presents with  . Follow-up    Patient c/o SOB, worsening with exertion, and weight gain    History of Present Illness:    Tina Pacheco is a 70 y.o. female  past medical history of: morbid obesity , fibromyalgia  March 26,2012paroxysmal atrial fibrillation,  echocardiogram showing normal LV function with diastolic dysfunction,  hyperlipidemia, hypertension,  cardiac catheterization May 24 2010 mild nonobstructive coronary artery disease,  asthma/COPD,  suspected sleep apnea,  atrial fibrillation March and July 2012, October 2013.(no symptoms) started on diltiazem infusion and converted to normal sinus rhythm  rheumatoid arthritis  who presents for routine followup of her atrial fibrillation, chronic diastolic CHF.  LOV 01/2020 Seen in CHF clinic 05/03/2020  On today's visit, Weight up 25 pounds compared to 01/2020 in our office  Reports that she is taking torsemide 40 daily Sometimes 3 pills a day for total of 60 mg, no improvement in her swelling  Husband does the cooking, brings the food in Will eat out sometimes  Feels her leg swelling worse Sedentary, presents in wheelchair  Prior records reviewed In hospital November into 01/2020, discharged on December 2 Tremendous diuresis Baseline weight likely low 300s  EKG personally reviewed by myself on todays visit NSR rate 51 bpm no ST or T wave changes Denies any symptoms from bradycardia  Previous Echo in 2016 with EF 76%, diastolic dysfunction 03/2631: EF 55%   Prior CV studies:   The following studies were reviewed today:  Echo 2016 Left ventricle: The cavity size was normal. There was moderate   concentric  hypertrophy. Systolic function was normal. The   estimated ejection fraction was in the range of 55% to 60%. Wall   motion was normal; there were no regional wall motion   abnormalities. Features are consistent with a pseudonormal left   ventricular filling pattern, with concomitant abnormal relaxation   and increased filling pressure (grade 2 diastolic dysfunction). - Mitral valve: Calcified annulus. There was mild regurgitation. - Left atrium: The atrium was moderately dilated. - Pulmonary arteries: Systolic pressure was mildly increased. PA   peak pressure: 38 mm Hg (S).  Past Medical History:  Diagnosis Date  . Acute CHF (congestive heart failure) ([place]) 03/30/2019  . Arthritis    RA  . Asthma   . Basal cell carcinoma   . Cataract 2018   bilateral eyes; corrected with surgery  . Claustrophobia   . Collagen vascular disease (HCC)    RA  . COPD (chronic obstructive pulmonary disease) ([place])   . Diastolic dysfunction    a. echo 07/2014: EF 55-60%, no RWMA, GR2DD, mild MR, LA moderately dilated, PASP 38 mm Hg  . Dyslipidemia   . Dysrhythmia   . Headache    migraines  . Hemihypertrophy   . History of cardiac cath    a. 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 History of gout   . Hyperplastic colonic polyp 2003  . Hypertension   . Hypokalemia   . Morbid obesity ([place])   . PAF (paroxysmal atrial fibrillation) (HCC)    a. on Pradaxa; b. CHADSVASc at least 2 (HTN & female)  . Rheumatoid arthritis(714.0)   .  Sleep apnea    a. not compliant with CPAP   Past Surgical History:  Procedure Laterality Date  . ABDOMINAL HYSTERECTOMY    . APPLICATION OF WOUND VAC Right 08/09/2017   Procedure: APPLICATION OF WOUND VAC;  Surgeon: Albertine Patricia, DPM;  Location: [facility];  Service: Podiatry;  Laterality: Right;  . CARDIAC CATHETERIZATION  05/24/2010   nonobstructive CAD  . CATARACT EXTRACTION W/ INTRAOCULAR LENS IMPLANT Right 06/12/2016   Dr. Darleen Crocker  . CATARACT EXTRACTION  W/ INTRAOCULAR LENS IMPLANT Left 06/26/2016   Dr. Darleen Crocker  . CESAREAN SECTION    . CHOLECYSTECTOMY    . COLONOSCOPY  06/12/2011   Procedure: COLONOSCOPY;  Surgeon: Juanita Craver, MD;  Location: [facility];  Service: Endoscopy;  Laterality: N/A;  . COLONOSCOPY N/A 03/17/2013   Procedure: COLONOSCOPY;  Surgeon: Juanita Craver, MD;  Location: [facility];  Service: Endoscopy;  Laterality: N/A;  . EYE SURGERY    . FOOT ARTHRODESIS Right 07/13/2017   Procedure: ARTHRODESIS FOOT-MULTI.FUSIONS (6 JOINTS);  Surgeon: Albertine Patricia, DPM;  Location: [facility];  Service: Podiatry;  Laterality: Right;  . IRRIGATION AND DEBRIDEMENT FOOT Right 08/09/2017   Procedure: IRRIGATION AND DEBRIDEMENT FOOT;  Surgeon: Albertine Patricia, DPM;  Location: [facility];  Service: Podiatry;  Laterality: Right;  . KNEE ARTHROSCOPY     bilateral  . SINUSOTOMY    . TOOTH EXTRACTION  12/2016  . VAGINAL HYSTERECTOMY       Current Outpatient Medications on File Prior to Visit  Medication Sig Dispense Refill  . albuterol (PROVENTIL) (2.5 MG/3ML) 0.083% nebulizer solution Take 3 mLs (2.5 mg total) by nebulization every 6 (six) hours as needed for wheezing or shortness of breath. 150 mL 1  . amLODipine (NORVASC) 5 MG tablet TAKE 1 TABLET BY MOUTH EVERY DAY IN THE EVENING 90 tablet 3  . apixaban (ELIQUIS) 5 MG TABS tablet Take 1 tablet (5 mg total) by mouth 2 (two) times daily. 180 tablet 1  . atorvastatin (LIPITOR) 20 MG tablet TAKE 1 TABLET BY MOUTH DAILY AT 6 PM 90 tablet 3  . Certolizumab Pegol (CIMZIA [place]) Inject into the skin every 30 (thirty) days.    Stasia Cavalier (EUCRISA) 2 % OINT Apply to aa's BID PRN flares. 60 g 3  . fluconazole (DIFLUCAN) 100 MG tablet Take 1 tablet (100 mg total) by mouth daily. Repeat every few weeks as needed 7 tablet 3  . gabapentin (NEURONTIN) 300 MG capsule Take 1 capsule (300 mg total) by mouth 4 (four) times daily. 120 capsule 11  . ketoconazole (NIZORAL) 2 % cream Apply 1 application  topically 2 (two) times daily.    Tina Pacheco leflunomide (ARAVA) 20 MG tablet Take 20 mg by mouth daily.    . meclizine (ANTIVERT) 25 MG tablet TAKE 1 TABLET(25 MG) BY MOUTH THREE TIMES DAILY AS NEEDED FOR DIZZINESS 90 tablet 5  . metoprolol succinate (TOPROL-XL) 50 MG 24 hr tablet TAKE 1 TABLET BY MOUTH EVERY DAY WITH OR IMMEDIATELY FOLLOWING A MEAL 90 tablet 3  . montelukast (SINGULAIR) 10 MG tablet TAKE 1 TABLET BY MOUTH AT BEDTIME 90 tablet 3  . mupirocin ointment (BACTROBAN) 2 % Apply 1 application topically 2 (two) times daily. Qd to burn wounds 30 g 3  . nystatin (NYSTATIN) powder Apply 1 application topically 3 (three) times daily as needed.    . Oxycodone HCl 20 MG TABS Take 1 tablet (20 mg total) by mouth in the morning and at bedtime. Substitute for prev rx 60 tablet  0  . polyethylene glycol (MIRALAX / GLYCOLAX) packet Take 17 g by mouth daily as needed for mild constipation. 14 each 0  . potassium chloride SA (KLOR-CON) 20 MEQ tablet Take 0.5 tablets (10 mEq total) by mouth 2 (two) times daily. 90 tablet 0  . predniSONE (DELTASONE) 5 MG tablet Take 3-4 tablets (15-20 mg total) by mouth daily with breakfast. Wean as directed 120 tablet 5  . SF 5000 PLUS 1.1 % CREA dental cream Place 1 application onto teeth 2 (two) times daily.  1  . spironolactone (ALDACTONE) 25 MG tablet TAKE 1 TABLET(25 MG) BY MOUTH DAILY 30 tablet 11  . terazosin (HYTRIN) 10 MG capsule TAKE 1 CAPSULE(10 MG) BY MOUTH AT BEDTIME 90 capsule 3  . torsemide (DEMADEX) 20 MG tablet Take 40 mg by mouth daily. Takes an extra tablet when feeling SOB or has noticeable edema     No current facility-administered medications on file prior to visit.    Allergies:   Fluoxetine and Codeine   Social History   Tobacco Use  . Smoking status: Never Smoker  . Smokeless tobacco: Never Used  Vaping Use  . Vaping Use: Never used  Substance Use Topics  . Alcohol use: No  . Drug use: No     Family Hx: The patient's family history  includes Breast cancer in her maternal aunt and sister; Diabetes type II in her sister; Emphysema in her father; Parkinsonism in her mother; Tuberculosis in her mother and sister.  ROS:   Please see the history of present illness.    Review of Systems  Constitutional: Negative.        Weak  HENT: Negative.   Respiratory: Positive for shortness of breath.   Cardiovascular: Positive for leg swelling.  Gastrointestinal: Negative.   Musculoskeletal: Positive for joint pain.  Neurological: Negative.   Psychiatric/Behavioral: Negative.   All other systems reviewed and are negative.    Labs/Other Tests and Data Reviewed:    Recent Labs: 01/05/2020: ALT 13 01/28/2020: B Natriuretic Peptide 256.1 01/29/2020: Hemoglobin 11.7; Platelets 223 02/03/2020: BUN 26; Creatinine, Ser 1.05; Potassium 3.8; Sodium 138   Recent Lipid Panel Lab Results  Component Value Date/Time   CHOL 113 08/27/2019 12:47 PM   TRIG 70.0 08/27/2019 12:47 PM   HDL 43.70 08/27/2019 12:47 PM   CHOLHDL 3 08/27/2019 12:47 PM   LDLCALC 56 08/27/2019 12:47 PM    Wt Readings from Last 3 Encounters:  05/24/20 (!) 334 lb (151.5 kg)  05/03/20 (!) 313 lb (142 kg)  03/24/20 (!) 313 lb (142 kg)     Exam:    Vital Signs:  BP 140/82 (BP Location: Left Arm, Patient Position: Sitting, Cuff Size: Large)   Pulse 68   Ht 5\' 3"  (1.6 m)   Wt (!) 334 lb (151.5 kg)   SpO2 96%   BMI 59.17 kg/m   Constitutional:  oriented to person, place, and time. No distress.  HENT:  Head: Grossly normal Eyes:  no discharge. No scleral icterus.  Neck: Unable to estimate JVD, no carotid bruits  Cardiovascular: Regular rate and rhythm, no murmurs appreciated 1+ pitting lower extremity edema Pulmonary/Chest: Clear to auscultation bilaterally, no wheezes or rails Abdominal: Soft.  no distension.  no tenderness.  Musculoskeletal: Normal range of motion Neurological:  normal muscle tone. Coordination normal. No atrophy Skin: Skin warm and  dry Psychiatric: normal affect, pleasant   ASSESSMENT & PLAN:     1. PAF (paroxysmal atrial fibrillation) (HCC) Maintaining normal sinus rhythm  rate 68 bpm No changes to the medications  2. Essential hypertension Blood pressure is well controlled on today's visit. No changes made to the medications.  3. Mixed hyperlipidemia Cholesterol is at goal on the current lipid regimen. No changes to the medications were made.  4. Rheumatoid arthritis, involving unspecified site, unspecified rheumatoid factor presence ([place]) Started on new medication, Concerned about potential for side effects but not having any at this time  5.  Morbid obesity Weight up over 20 pounds, recommended low carbohydrate foods, Has been doing the cooking and the shopping, he is not present for today's visit No regular exercise program  6.  Depression Managed by primary care  7.  Chronic diastolic CHF Goal weight likely less than 210 pounds Massive weight gain on today's visit up 20 pounds She will continue torsemide 40 daily, extra torsemide in the afternoon for weight gain Potassium 20 daily We have given metolazone 2.5 mg for her to take sparingly Recommend twice a week with the metolazone holding for weight less than 210 -Dietary discretion recommended  Close follow-up in clinic   Total encounter time more than 25 minutes  Greater than 50% was spent in counseling and coordination of care with the patient   Signed, Ida Rogue, MD  05/24/2020 11:52 AM    [place] Office [address], [place], [place] [address]

## 2020-05-24 ENCOUNTER — Other Ambulatory Visit: Payer: Self-pay

## 2020-05-24 ENCOUNTER — Ambulatory Visit (INDEPENDENT_AMBULATORY_CARE_PROVIDER_SITE_OTHER): Payer: Medicare Other | Admitting: Cardiovascular Disease

## 2020-05-24 ENCOUNTER — Encounter: Payer: Self-pay | Admitting: Cardiovascular Disease

## 2020-05-24 VITALS — BP 140/82 | HR 68 | Ht 63.0 in | Wt 334.0 lb

## 2020-05-24 DIAGNOSIS — I2510 Atherosclerotic heart disease of native coronary artery without angina pectoris: Secondary | ICD-10-CM

## 2020-05-24 DIAGNOSIS — I480 Paroxysmal atrial fibrillation: Secondary | ICD-10-CM | POA: Diagnosis not present

## 2020-05-24 DIAGNOSIS — E782 Mixed hyperlipidemia: Secondary | ICD-10-CM | POA: Diagnosis not present

## 2020-05-24 DIAGNOSIS — I10 Essential (primary) hypertension: Secondary | ICD-10-CM | POA: Diagnosis not present

## 2020-05-24 DIAGNOSIS — I5032 Chronic diastolic (congestive) heart failure: Secondary | ICD-10-CM | POA: Diagnosis not present

## 2020-05-24 DIAGNOSIS — J449 Chronic obstructive pulmonary disease, unspecified: Secondary | ICD-10-CM

## 2020-05-24 MED ORDER — METOLAZONE 2.5 MG PO TABS: 2.5000 mg | ORAL_TABLET | Freq: Every day | ORAL | 2 refills | Status: DC | PRN

## 2020-05-24 NOTE — Patient Instructions (Addendum)
Medication Instructions:  Start: Metolazone sparingly 2.5 mg daily PRN  No more than 1-2 x a week  For every metolazone, take an additional potassium pill (20 meq)  Continue on the torsemide 40 mg in the morning  Lab work: No new labs needed  Testing/Procedures: No new testing needed  Follow-Up:   . You will need a follow up appointment in 1 month  . Providers on your designated Care Team:   . Murray Hodgkins, NP . Christell Faith, PA-C . Marrianne Mood, PA-C

## 2020-05-27 ENCOUNTER — Other Ambulatory Visit: Payer: Self-pay | Admitting: Internal Medicine

## 2020-05-27 MED ORDER — OXYCODONE HCL 20 MG PO TABS
1.0000 | ORAL_TABLET | Freq: Two times a day (BID) | ORAL | 0 refills | Status: DC
Start: 2020-05-27 — End: 2020-06-08

## 2020-05-27 NOTE — Telephone Encounter (Signed)
  LAST APPOINTMENT DATE: 05/17/2020   NEXT APPOINTMENT DATE:@4 /26/2022  MEDICATION: oxycodone  PHARMACY: [facility]- [id number]   Let patient know to contact pharmacy at the end of the day to make sure medication is ready.  Please notify patient to allow 48-72 hours to process  Encourage patient to contact the pharmacy for refills or they can request refills through [place]:   LAST REFILL:  QTY:  REFILL DATE:    OTHER COMMENTS:    Okay for refill?  Please advise

## 2020-05-27 NOTE — Telephone Encounter (Signed)
Name of Medication:oxycodone 10 mg Name of Pharmacy:[facility] [address] Last Fill or Written Date and Quantity:04-29-20 #120 Last Office Visit and Type:03-24-20 Next Office Visit and Type:06-22-20 Last Controlled Substance Agreement Date:05/23/19 Last UDS:05/14/19

## 2020-05-28 ENCOUNTER — Emergency Department: Payer: Medicare Other

## 2020-05-28 ENCOUNTER — Inpatient Hospital Stay
Admission: EM | Admit: 2020-05-28 | Discharge: 2020-06-08 | DRG: 286 | Disposition: A | Discharge status: Skilled Nursing Facility | Payer: Medicare Other | Attending: Internal Medicine | Admitting: Internal Medicine

## 2020-05-28 DIAGNOSIS — I509 Heart failure, unspecified: Secondary | ICD-10-CM | POA: Diagnosis not present

## 2020-05-28 DIAGNOSIS — I25119 Atherosclerotic heart disease of native coronary artery with unspecified angina pectoris: Secondary | ICD-10-CM | POA: Diagnosis not present

## 2020-05-28 DIAGNOSIS — R0602 Shortness of breath: Secondary | ICD-10-CM | POA: Diagnosis not present

## 2020-05-28 DIAGNOSIS — I480 Paroxysmal atrial fibrillation: Secondary | ICD-10-CM | POA: Diagnosis not present

## 2020-05-28 DIAGNOSIS — G894 Chronic pain syndrome: Secondary | ICD-10-CM | POA: Diagnosis present

## 2020-05-28 DIAGNOSIS — I2510 Atherosclerotic heart disease of native coronary artery without angina pectoris: Secondary | ICD-10-CM | POA: Diagnosis present

## 2020-05-28 DIAGNOSIS — I4891 Unspecified atrial fibrillation: Secondary | ICD-10-CM | POA: Diagnosis not present

## 2020-05-28 DIAGNOSIS — T501X5A Adverse effect of loop [high-ceiling] diuretics, initial encounter: Secondary | ICD-10-CM | POA: Diagnosis not present

## 2020-05-28 DIAGNOSIS — G629 Polyneuropathy, unspecified: Secondary | ICD-10-CM | POA: Diagnosis present

## 2020-05-28 DIAGNOSIS — M069 Rheumatoid arthritis, unspecified: Secondary | ICD-10-CM | POA: Diagnosis not present

## 2020-05-28 DIAGNOSIS — R9439 Abnormal result of other cardiovascular function study: Secondary | ICD-10-CM

## 2020-05-28 DIAGNOSIS — E785 Hyperlipidemia, unspecified: Secondary | ICD-10-CM | POA: Diagnosis present

## 2020-05-28 DIAGNOSIS — E782 Mixed hyperlipidemia: Secondary | ICD-10-CM | POA: Diagnosis not present

## 2020-05-28 DIAGNOSIS — Z825 Family history of asthma and other chronic lower respiratory diseases: Secondary | ICD-10-CM

## 2020-05-28 DIAGNOSIS — I493 Ventricular premature depolarization: Secondary | ICD-10-CM | POA: Diagnosis present

## 2020-05-28 DIAGNOSIS — K5900 Constipation, unspecified: Secondary | ICD-10-CM | POA: Diagnosis not present

## 2020-05-28 DIAGNOSIS — J9621 Acute and chronic respiratory failure with hypoxia: Secondary | ICD-10-CM | POA: Diagnosis present

## 2020-05-28 DIAGNOSIS — F40240 Claustrophobia: Secondary | ICD-10-CM | POA: Diagnosis present

## 2020-05-28 DIAGNOSIS — R278 Other lack of coordination: Secondary | ICD-10-CM | POA: Diagnosis not present

## 2020-05-28 DIAGNOSIS — I110 Hypertensive heart disease with heart failure: Secondary | ICD-10-CM | POA: Diagnosis present

## 2020-05-28 DIAGNOSIS — R2681 Unsteadiness on feet: Secondary | ICD-10-CM | POA: Diagnosis not present

## 2020-05-28 DIAGNOSIS — M2550 Pain in unspecified joint: Secondary | ICD-10-CM | POA: Diagnosis not present

## 2020-05-28 DIAGNOSIS — R079 Chest pain, unspecified: Secondary | ICD-10-CM | POA: Diagnosis not present

## 2020-05-28 DIAGNOSIS — G4733 Obstructive sleep apnea (adult) (pediatric): Secondary | ICD-10-CM | POA: Diagnosis present

## 2020-05-28 DIAGNOSIS — J449 Chronic obstructive pulmonary disease, unspecified: Secondary | ICD-10-CM | POA: Diagnosis not present

## 2020-05-28 DIAGNOSIS — Z6841 Body Mass Index (BMI) 40.0 and over, adult: Secondary | ICD-10-CM

## 2020-05-28 DIAGNOSIS — R296 Repeated falls: Secondary | ICD-10-CM | POA: Diagnosis present

## 2020-05-28 DIAGNOSIS — I5033 Acute on chronic diastolic (congestive) heart failure: Secondary | ICD-10-CM | POA: Diagnosis present

## 2020-05-28 DIAGNOSIS — G2581 Restless legs syndrome: Secondary | ICD-10-CM | POA: Diagnosis present

## 2020-05-28 DIAGNOSIS — E876 Hypokalemia: Secondary | ICD-10-CM | POA: Diagnosis not present

## 2020-05-28 DIAGNOSIS — R031 Nonspecific low blood-pressure reading: Secondary | ICD-10-CM | POA: Diagnosis not present

## 2020-05-28 DIAGNOSIS — I248 Other forms of acute ischemic heart disease: Secondary | ICD-10-CM | POA: Diagnosis present

## 2020-05-28 DIAGNOSIS — Z20822 Contact with and (suspected) exposure to covid-19: Secondary | ICD-10-CM | POA: Diagnosis present

## 2020-05-28 DIAGNOSIS — M109 Gout, unspecified: Secondary | ICD-10-CM | POA: Diagnosis present

## 2020-05-28 DIAGNOSIS — I499 Cardiac arrhythmia, unspecified: Secondary | ICD-10-CM | POA: Diagnosis not present

## 2020-05-28 DIAGNOSIS — Z9119 Patient's noncompliance with other medical treatment and regimen: Secondary | ICD-10-CM

## 2020-05-28 DIAGNOSIS — Z7952 Long term (current) use of systemic steroids: Secondary | ICD-10-CM

## 2020-05-28 DIAGNOSIS — I517 Cardiomegaly: Secondary | ICD-10-CM | POA: Diagnosis not present

## 2020-05-28 DIAGNOSIS — I10 Essential (primary) hypertension: Secondary | ICD-10-CM | POA: Diagnosis not present

## 2020-05-28 DIAGNOSIS — Z888 Allergy status to other drugs, medicaments and biological substances status: Secondary | ICD-10-CM

## 2020-05-28 DIAGNOSIS — Z7401 Bed confinement status: Secondary | ICD-10-CM | POA: Diagnosis not present

## 2020-05-28 DIAGNOSIS — M353 Polymyalgia rheumatica: Secondary | ICD-10-CM | POA: Diagnosis present

## 2020-05-28 DIAGNOSIS — Z90710 Acquired absence of both cervix and uterus: Secondary | ICD-10-CM

## 2020-05-28 DIAGNOSIS — R778 Other specified abnormalities of plasma proteins: Secondary | ICD-10-CM | POA: Diagnosis not present

## 2020-05-28 DIAGNOSIS — Z885 Allergy status to narcotic agent status: Secondary | ICD-10-CM

## 2020-05-28 DIAGNOSIS — J811 Chronic pulmonary edema: Secondary | ICD-10-CM | POA: Diagnosis not present

## 2020-05-28 DIAGNOSIS — I5031 Acute diastolic (congestive) heart failure: Secondary | ICD-10-CM | POA: Diagnosis not present

## 2020-05-28 DIAGNOSIS — Z85828 Personal history of other malignant neoplasm of skin: Secondary | ICD-10-CM

## 2020-05-28 DIAGNOSIS — Z79899 Other long term (current) drug therapy: Secondary | ICD-10-CM

## 2020-05-28 DIAGNOSIS — N179 Acute kidney failure, unspecified: Secondary | ICD-10-CM | POA: Diagnosis present

## 2020-05-28 DIAGNOSIS — R42 Dizziness and giddiness: Secondary | ICD-10-CM | POA: Diagnosis not present

## 2020-05-28 DIAGNOSIS — M6281 Muscle weakness (generalized): Secondary | ICD-10-CM | POA: Diagnosis not present

## 2020-05-28 DIAGNOSIS — Z79891 Long term (current) use of opiate analgesic: Secondary | ICD-10-CM

## 2020-05-28 DIAGNOSIS — T502X5A Adverse effect of carbonic-anhydrase inhibitors, benzothiadiazides and other diuretics, initial encounter: Secondary | ICD-10-CM | POA: Diagnosis not present

## 2020-05-28 DIAGNOSIS — N3281 Overactive bladder: Secondary | ICD-10-CM | POA: Diagnosis not present

## 2020-05-28 DIAGNOSIS — R5381 Other malaise: Secondary | ICD-10-CM | POA: Diagnosis not present

## 2020-05-28 DIAGNOSIS — Z4789 Encounter for other orthopedic aftercare: Secondary | ICD-10-CM | POA: Diagnosis not present

## 2020-05-28 DIAGNOSIS — R000 Tachycardia, unspecified: Secondary | ICD-10-CM | POA: Diagnosis not present

## 2020-05-28 DIAGNOSIS — I5032 Chronic diastolic (congestive) heart failure: Secondary | ICD-10-CM

## 2020-05-28 DIAGNOSIS — R2689 Other abnormalities of gait and mobility: Secondary | ICD-10-CM | POA: Diagnosis not present

## 2020-05-28 DIAGNOSIS — Z7901 Long term (current) use of anticoagulants: Secondary | ICD-10-CM

## 2020-05-28 LAB — CBC WITH DIFFERENTIAL/PLATELET
Abs Immature Granulocytes: 0.01 10*3/uL (ref 0.00–0.07)
Basophils Absolute: 0.1 10*3/uL (ref 0.0–0.1)
Basophils Relative: 1 %
Eosinophils Absolute: 0.2 10*3/uL (ref 0.0–0.5)
Eosinophils Relative: 3 %
HCT: 42.7 % (ref 36.0–46.0)
Hemoglobin: 13.6 g/dL (ref 12.0–15.0)
Immature Granulocytes: 0 %
Lymphocytes Relative: 32 %
Lymphs Abs: 3.1 10*3/uL (ref 0.7–4.0)
MCH: 28.9 pg (ref 26.0–34.0)
MCHC: 31.9 g/dL (ref 30.0–36.0)
MCV: 90.9 fL (ref 80.0–100.0)
Monocytes Absolute: 0.8 10*3/uL (ref 0.1–1.0)
Monocytes Relative: 9 %
Neutro Abs: 5.4 10*3/uL (ref 1.7–7.7)
Neutrophils Relative %: 55 %
Platelets: 253 10*3/uL (ref 150–400)
RBC: 4.7 MIL/uL (ref 3.87–5.11)
RDW: 15.4 % (ref 11.5–15.5)
WBC: 9.6 10*3/uL (ref 4.0–10.5)
nRBC: 0 % (ref 0.0–0.2)

## 2020-05-28 LAB — APTT: aPTT: 32 seconds (ref 24–36)

## 2020-05-28 LAB — COMPREHENSIVE METABOLIC PANEL
ALT: 23 U/L (ref 0–44)
AST: 36 U/L (ref 15–41)
Albumin: 3.9 g/dL (ref 3.5–5.0)
Alkaline Phosphatase: 103 U/L (ref 38–126)
Anion gap: 14 (ref 5–15)
BUN: 22 mg/dL (ref 8–23)
CO2: 27 mmol/L (ref 22–32)
Calcium: 10.1 mg/dL (ref 8.9–10.3)
Chloride: 94 mmol/L — ABNORMAL LOW (ref 98–111)
Creatinine, Ser: 1.1 mg/dL — ABNORMAL HIGH (ref 0.44–1.00)
GFR, Estimated: 54 mL/min — ABNORMAL LOW (ref >60)
Glucose, Bld: 127 mg/dL — ABNORMAL HIGH (ref 70–99)
Potassium: 3.4 mmol/L — ABNORMAL LOW (ref 3.5–5.1)
Sodium: 135 mmol/L (ref 135–145)
Total Bilirubin: 1.6 mg/dL — ABNORMAL HIGH (ref 0.3–1.2)
Total Protein: 6.9 g/dL (ref 6.5–8.1)

## 2020-05-28 LAB — MAGNESIUM: Magnesium: 1.7 mg/dL (ref 1.7–2.4)

## 2020-05-28 LAB — TROPONIN I (HIGH SENSITIVITY)
Troponin I (High Sensitivity): 17 ng/L (ref <18)
Troponin I (High Sensitivity): 28 ng/L — ABNORMAL HIGH (ref <18)

## 2020-05-28 LAB — PROTIME-INR
INR: 1.2 (ref 0.8–1.2)
Prothrombin Time: 14.6 seconds (ref 11.4–15.2)

## 2020-05-28 LAB — BRAIN NATRIURETIC PEPTIDE: B Natriuretic Peptide: 57.6 pg/mL (ref 0.0–100.0)

## 2020-05-28 MED ORDER — DILTIAZEM HCL 25 MG/5ML IV SOLN
25.0000 mg | Freq: Once | INTRAVENOUS | Status: AC
  Administered 2020-05-28: 25 mg via INTRAVENOUS
  Filled 2020-05-28: qty 5

## 2020-05-28 MED ORDER — POTASSIUM CHLORIDE CRYS ER 20 MEQ PO TBCR
40.0000 meq | EXTENDED_RELEASE_TABLET | Freq: Once | ORAL | Status: AC
  Administered 2020-05-28: 40 meq via ORAL
  Filled 2020-05-28: qty 2

## 2020-05-28 MED ORDER — POTASSIUM CHLORIDE 10 MEQ/100ML IV SOLN
10.0000 meq | INTRAVENOUS | Status: AC
  Administered 2020-05-28: 10 meq via INTRAVENOUS
  Filled 2020-05-28: qty 100

## 2020-05-28 MED ORDER — DILTIAZEM HCL 25 MG/5ML IV SOLN
15.0000 mg | Freq: Once | INTRAVENOUS | Status: AC
  Administered 2020-05-28: 15 mg via INTRAVENOUS
  Filled 2020-05-28: qty 5

## 2020-05-28 MED ORDER — FUROSEMIDE 10 MG/ML IJ SOLN
80.0000 mg | Freq: Once | INTRAMUSCULAR | Status: AC
  Administered 2020-05-28: 80 mg via INTRAVENOUS
  Filled 2020-05-28: qty 8

## 2020-05-28 MED ORDER — AMIODARONE LOAD VIA INFUSION
150.0000 mg | Freq: Once | INTRAVENOUS | Status: AC
  Administered 2020-05-28: 150 mg via INTRAVENOUS
  Filled 2020-05-28: qty 83.34

## 2020-05-28 MED ORDER — AMIODARONE HCL IN DEXTROSE 360-4.14 MG/200ML-% IV SOLN
60.0000 mg/h | INTRAVENOUS | Status: DC
  Administered 2020-05-28 – 2020-05-29 (×2): 60 mg/h via INTRAVENOUS
  Filled 2020-05-28 (×2): qty 200

## 2020-05-28 MED ORDER — AMIODARONE HCL IN DEXTROSE 360-4.14 MG/200ML-% IV SOLN
30.0000 mg/h | INTRAVENOUS | Status: DC
  Administered 2020-05-29: 30 mg/h via INTRAVENOUS
  Filled 2020-05-28: qty 200
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
## 2020-05-28 NOTE — ED Provider Notes (Signed)
[facility] Emergency Department Provider Note  ____________________________________________   Event Date/Time   First MD Initiated Contact with Patient 05/28/20 2016     (approximate)  I have reviewed the triage vital signs and the nursing notes.   HISTORY  Chief Complaint  SOB and chest pain.  HPI Tina Pacheco is a 70 y.o. female with CHF, COPD, nonobstructive cath in 2012, paroxysmal A. fib on Eliquis comes in with chest pain shortness of breath. Pt reports symptoms started around 7pm. Reports pain is in middle chest, constant, nothing makes better or worse. Reports recent weight gain and them adding on new diuretic. She reports compliance with her eliquis. Does report bilateral leg swelling. No fevers.           Past Medical History:  Diagnosis Date  . Acute CHF (congestive heart failure) ([place]) 03/30/2019  . Arthritis    RA  . Asthma   . Basal cell carcinoma   . Cataract 2018   bilateral eyes; corrected with surgery  . Claustrophobia   . Collagen vascular disease (HCC)    RA  . COPD (chronic obstructive pulmonary disease) ([place])   . Diastolic dysfunction    a. echo 07/2014: EF 55-60%, no RWMA, GR2DD, mild MR, LA moderately dilated, PASP 38 mm Hg  . Dyslipidemia   . Dysrhythmia   . Headache    migraines  . Hemihypertrophy   . History of cardiac cath    a. 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 History of gout   . Hyperplastic colonic polyp 2003  . Hypertension   . Hypokalemia   . Morbid obesity ([place])   . PAF (paroxysmal atrial fibrillation) (HCC)    a. on Pradaxa; b. CHADSVASc at least 2 (HTN & female)  . Rheumatoid arthritis(714.0)   . Sleep apnea    a. not compliant with CPAP    Patient Active Problem List   Diagnosis Date Noted  . Polymyalgia rheumatica ([place]) 02/03/2020  . Debility 01/28/2020  . Lack of physical exercise 01/28/2020  . Physical deconditioning 01/28/2020  . Intertrigo of genitocrural region due to  Candida species 01/05/2020  . Intertrigo 01/05/2020  . Heart failure ([place]) 01/05/2020  . Chronic diastolic heart failure ([place])   . Pressure injury of skin 03/31/2019  . COPD (chronic obstructive pulmonary disease) ([place]) 03/30/2019  . Urge incontinence of urine 12/23/2018  . Left leg pain 09/18/2018  . 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 Preventative health care 03/20/2018  . MDD (major depressive disorder), single episode, moderate ([place]) 03/20/2018  . Advance directive discussed with patient 03/20/2018  . Lymphedema 11/26/2017  . Narcotic dependence ([place]) 03/09/2017  . Vertigo 06/28/2016  . Bronchiectasis without acute exacerbation ([place]) 06/15/2015  . DDD (degenerative disc disease), lumbosacral 03/30/2015  . Sleep apnea   . Atherosclerotic heart disease of native coronary artery with angina pectoris ([place]) 08/25/2014  . PAF (paroxysmal atrial fibrillation) ([place]) 08/25/2014  . 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 RLS (restless legs syndrome) 07/06/2014  . Gout 05/26/2014  . Chronic pain syndrome 05/26/2014  . Migraine 04/08/2014  . Acquired deformity of arm 01/05/2014  . Back pain 07/22/2013  . Metatarsalgia of right foot 07/22/2013  . Insomnia 07/15/2013  . OA (osteoarthritis) 11/20/2012  . DJD (degenerative joint disease) of cervical spine 09/10/2012  . Trapezius muscle spasm 07/21/2011  . Allergic rhinitis 07/10/2011  . Rheumatoid arthritis ([place]) 03/06/2011  . Morbid obesity ([place]) 06/22/2010  . HLD (  hyperlipidemia) 06/22/2010  . HTN (hypertension) 06/22/2010    Past Surgical History:  Procedure Laterality Date  . ABDOMINAL HYSTERECTOMY    . APPLICATION OF WOUND VAC Right 08/09/2017   Procedure: APPLICATION OF WOUND VAC;  Surgeon: Albertine Patricia, DPM;  Location: [facility] ORS;  Service: Podiatry;  Laterality: Right;  . CARDIAC CATHETERIZATION  05/24/2010   nonobstructive CAD  . CATARACT EXTRACTION W/ INTRAOCULAR LENS IMPLANT Right  06/12/2016   Dr. Darleen Crocker  . CATARACT EXTRACTION W/ INTRAOCULAR LENS IMPLANT Left 06/26/2016   Dr. Darleen Crocker  . CESAREAN SECTION    . CHOLECYSTECTOMY    . COLONOSCOPY  06/12/2011   Procedure: COLONOSCOPY;  Surgeon: Juanita Craver, MD;  Location: [facility];  Service: Endoscopy;  Laterality: N/A;  . COLONOSCOPY N/A 03/17/2013   Procedure: COLONOSCOPY;  Surgeon: Juanita Craver, MD;  Location: [facility] ENDOSCOPY;  Service: Endoscopy;  Laterality: N/A;  . EYE SURGERY    . FOOT ARTHRODESIS Right 07/13/2017   Procedure: ARTHRODESIS FOOT-MULTI.FUSIONS (6 JOINTS);  Surgeon: Albertine Patricia, DPM;  Location: [facility] ORS;  Service: Podiatry;  Laterality: Right;  . IRRIGATION AND DEBRIDEMENT FOOT Right 08/09/2017   Procedure: IRRIGATION AND DEBRIDEMENT FOOT;  Surgeon: Albertine Patricia, DPM;  Location: [facility] ORS;  Service: Podiatry;  Laterality: Right;  . KNEE ARTHROSCOPY     bilateral  . SINUSOTOMY    . TOOTH EXTRACTION  12/2016  . VAGINAL HYSTERECTOMY      Prior to Admission medications   Medication Sig Start Date End Date Taking? Authorizing Provider  albuterol (PROVENTIL) (2.5 MG/3ML) 0.083% nebulizer solution Take 3 mLs (2.5 mg total) by nebulization every 6 (six) hours as needed for wheezing or shortness of breath. 01/08/20   Venia Carbon, MD  amLODipine (NORVASC) 5 MG tablet TAKE 1 TABLET BY MOUTH EVERY DAY IN THE EVENING 09/04/19   Venia Carbon, MD  apixaban (ELIQUIS) 5 MG TABS tablet Take 1 tablet (5 mg total) by mouth 2 (two) times daily. 05/07/20   Minna Merritts, MD  atorvastatin (LIPITOR) 20 MG tablet TAKE 1 TABLET BY MOUTH DAILY AT 6 PM 01/25/20   Venia Carbon, MD  Certolizumab Pegol [facility]) Inject into the skin every 30 (thirty) days.    [provider]  Crisaborole (EUCRISA) 2 % OINT Apply to aa's BID PRN flares. 02/10/20   Brendolyn Patty, MD  fluconazole (DIFLUCAN) 100 MG tablet Take 1 tablet (100 mg total) by mouth daily. Repeat every few weeks as needed 01/14/20    Venia Carbon, MD  gabapentin (NEURONTIN) 300 MG capsule Take 1 capsule (300 mg total) by mouth 4 (four) times daily. 01/20/20   Venia Carbon, MD  ketoconazole (NIZORAL) 2 % cream Apply 1 application topically 2 (two) times daily. 12/30/19   [provider]  leflunomide (ARAVA) 20 MG tablet Take 20 mg by mouth daily.    [provider]  meclizine (ANTIVERT) 25 MG tablet TAKE 1 TABLET(25 MG) BY MOUTH THREE TIMES DAILY AS NEEDED FOR DIZZINESS 10/20/19   Venia Carbon, MD  metolazone (ZAROXOLYN) 2.5 MG tablet Take 1 tablet (2.5 mg total) by mouth daily as needed. 05/24/20   Minna Merritts, MD  metoprolol succinate (TOPROL-XL) 50 MG 24 hr tablet TAKE 1 TABLET BY MOUTH EVERY DAY WITH OR IMMEDIATELY FOLLOWING A MEAL 01/25/20   Viviana Simpler I, MD  montelukast (SINGULAIR) 10 MG tablet TAKE 1 TABLET BY MOUTH AT BEDTIME 02/02/20   Venia Carbon, MD  mupirocin ointment (BACTROBAN) 2 %  Apply 1 application topically 2 (two) times daily. Qd to burn wounds 03/22/20   Brendolyn Patty, MD  nystatin (NYSTATIN) powder Apply 1 application topically 3 (three) times daily as needed.    [provider]  Oxycodone HCl 20 MG TABS Take 1 tablet (20 mg total) by mouth in the morning and at bedtime. Substitute for prev rx 05/27/20   Venia Carbon, MD  polyethylene glycol (MIRALAX / GLYCOLAX) packet Take 17 g by mouth daily as needed for mild constipation. 07/16/17   Henreitta Leber, MD  potassium chloride SA (KLOR-CON) 20 MEQ tablet Take 0.5 tablets (10 mEq total) by mouth 2 (two) times daily. 04/30/20   Minna Merritts, MD  predniSONE (DELTASONE) 5 MG tablet Take 3-4 tablets (15-20 mg total) by mouth daily with breakfast. Wean as directed 03/24/20   Venia Carbon, MD  SF 5000 PLUS 1.1 % CREA dental cream Place 1 application onto teeth 2 (two) times daily. 06/01/17   [provider]  spironolactone (ALDACTONE) 25 MG tablet TAKE 1 TABLET(25 MG) BY MOUTH DAILY 05/17/20    Viviana Simpler I, MD  terazosin (HYTRIN) 10 MG capsule TAKE 1 CAPSULE(10 MG) BY MOUTH AT BEDTIME 01/03/20   Viviana Simpler I, MD  torsemide (DEMADEX) 20 MG tablet Take 40 mg by mouth daily. Takes an extra tablet when feeling SOB or has noticeable edema    [provider]    Allergies Fluoxetine and Codeine  Family History  Problem Relation Age of Onset  . Emphysema Father        smoked  . Tuberculosis Mother   . Parkinsonism Mother   . Diabetes type II Sister   . Breast cancer Sister   . Breast cancer Maternal Aunt   . Tuberculosis Sister     Social History Social History   Tobacco Use  . Smoking status: Never Smoker  . Smokeless tobacco: Never Used  Vaping Use  . Vaping Use: Never used  Substance Use Topics  . Alcohol use: No  . Drug use: No      Review of Systems Constitutional: No fever/chills Eyes: No visual changes. ENT: No sore throat. Cardiovascular: + chest pain . Respiratory: + sob Gastrointestinal: No abdominal pain.  No nausea, no vomiting.  No diarrhea.  No constipation. Genitourinary: Negative for dysuria. Musculoskeletal: Negative for back pain. Skin: Negative for rash. Neurological: Negative for headaches, focal weakness or numbness. All other ROS negative ____________________________________________   PHYSICAL EXAM:  VITAL SIGNS: ED Triage Vitals [05/28/20 2015]  Enc Vitals Group     BP      Pulse      Resp      Temp      Temp src      SpO2      Weight (!) 319 lb (144.7 kg)     Height 5\' 3"  (1.6 m)     Head Circumference      Peak Flow      Pain Score 2     Pain Loc      Pain Edu?      Excl. in [place]?     Constitutional: Alert and oriented. Well appearing and in no acute distress. Eyes: Conjunctivae are normal. EOMI. Head: Atraumatic. Nose: No congestion/rhinnorhea. Mouth/Throat: Mucous membranes are moist.   Neck: No stridor. Trachea Midline. FROM Cardiovascular: irregular tachy. Grossly normal heart sounds.  Good  peripheral circulation. Respiratory: mild increased WOB  No retractions. Lungs CTAB. Gastrointestinal: Soft and nontender. No distention. No  abdominal bruits.  Musculoskeletal: + edema bilaterally.  No joint effusions. Neurologic:  Normal speech and language. No gross focal neurologic deficits are appreciated.  Skin:  Skin is warm, dry and intact. No rash noted. Psychiatric: Mood and affect are normal. Speech and behavior are normal. GU: Deferred   ____________________________________________   LABS (all labs ordered are listed, but only abnormal results are displayed)  Labs Reviewed  COMPREHENSIVE METABOLIC PANEL - Abnormal; Notable for the following components:      Result Value   Potassium 3.4 (*)    Chloride 94 (*)    Glucose, Bld 127 (*)    Creatinine, Ser 1.10 (*)    Total Bilirubin 1.6 (*)    GFR, Estimated 54 (*)    All other components within normal limits  BASIC METABOLIC PANEL - Abnormal; Notable for the following components:   Chloride 96 (*)    Glucose, Bld 129 (*)    Creatinine, Ser 1.09 (*)    GFR, Estimated 55 (*)    Anion gap 16 (*)    All other components within normal limits  TROPONIN I (HIGH SENSITIVITY) - Abnormal; Notable for the following components:   Troponin I (High Sensitivity) 28 (*)    All other components within normal limits  TROPONIN I (HIGH SENSITIVITY) - Abnormal; Notable for the following components:   Troponin I (High Sensitivity) 35 (*)    All other components within normal limits  TROPONIN I (HIGH SENSITIVITY) - Abnormal; Notable for the following components:   Troponin I (High Sensitivity) 37 (*)    All other components within normal limits  SARS CORONAVIRUS 2 (TAT 6-24 HRS)  CBC WITH DIFFERENTIAL/PLATELET  BRAIN NATRIURETIC PEPTIDE  PROTIME-INR  APTT  MAGNESIUM  HIV ANTIBODY (ROUTINE TESTING W REFLEX)  TROPONIN I (HIGH SENSITIVITY)   ____________________________________________   ED ECG REPORT I, Vanessa Ajo, the attending  physician, personally viewed and interpreted this ECG.  afib without evidence of stemi, normal intervals, normal t waves. Occasional PVC ____________________________________________  RADIOLOGY Robert Bellow, personally viewed and evaluated these images (plain radiographs) as part of my medical decision making, as well as reviewing the written report by the radiologist.  ED MD interpretation:  pulm edema  Official radiology report(s): DG Chest Portable 1 View  Result Date: 05/28/2020 CLINICAL DATA:  Shortness of breath EXAM: PORTABLE CHEST 1 VIEW COMPARISON:  01/28/2020 FINDINGS: Mild cardiomegaly. Interstitial opacities, likely mild pulmonary edema. No lobar consolidation. No pneumothorax or pleural effusion. IMPRESSION: Cardiomegaly mild interstitial pulmonary edema. Electronically Signed   By: Ulyses Jarred M.D.   On: 05/28/2020 20:50    ____________________________________________   PROCEDURES  Procedure(s) performed (including Critical Care):  .Critical Care Performed by: Vanessa [place], MD Authorized by: Vanessa [place], MD   Critical care provider statement:    Critical care time (minutes):  45   Critical care was necessary to treat or prevent imminent or life-threatening deterioration of the following conditions:  Cardiac failure   Critical care was time spent personally by me on the following activities:  Discussions with consultants, evaluation of patient's response to treatment, examination of patient, ordering and performing treatments and interventions, ordering and review of laboratory studies, ordering and review of radiographic studies, pulse oximetry, re-evaluation of patient's condition, obtaining history from patient or surrogate and review of old charts .1-3 Lead EKG Interpretation Performed by: Vanessa [place]on, MD Authorized by: Vanessa [place], MD     Interpretation: abnormal     ECG rate:  120-160s   ECG rate assessment: tachycardic     Rhythm: atrial  fibrillation     Ectopy: PVCs     Conduction: normal       ____________________________________________   INITIAL IMPRESSION / ASSESSMENT AND PLAN / ED COURSE  Tina Pacheco was evaluated in Emergency Department on 05/28/2020 for the symptoms described in the history of present illness. She was evaluated in the context of the global COVID-19 pandemic, which necessitated consideration that the patient might be at risk for infection with the SARS-CoV-2 virus that causes COVID-19. Institutional protocols and algorithms that pertain to the evaluation of patients at risk for COVID-19 are in a state of rapid change based on information released by regulatory bodies including the CDC and federal and state organizations. These policies and algorithms were followed during the patient's care in the ED.      Pt comes in with Afib with chest pain and SOB.  Last visit with cards she was normal sinus. Most likely in afib due to fluid overload. Consider cardioversion given on eliquis but suspect she will go back into it given fluid overload. Discussed different options and ok to proceed with meds first. Will check electrolytes, cardiac markers to evluate for ACS. Low supsicion for PE given on eliquis.   Pt given dilt without much improvement.  Started on amio drip  Chest xray pulm edema given 80 IV lasix.  K low will replete  Remains in afib on cardiac monitor. But pt feels better after amio started  Will d/w hopsital for admission     ____________________________________________   FINAL CLINICAL IMPRESSION(S) / ED DIAGNOSES   Final diagnoses:  Atrial fibrillation with rapid ventricular response ([place])  Acute on chronic congestive heart failure, unspecified heart failure type ([place])      MEDICATIONS GIVEN DURING THIS VISIT:  Medications  amiodarone (NEXTERONE PREMIX) 360-4.14 MG/200ML-% (1.8 mg/mL) IV infusion (60 mg/hr Intravenous New Bag/Given 05/29/20 0113)    Followed by  amiodarone  (NEXTERONE PREMIX) 360-4.14 MG/200ML-% (1.8 mg/mL) IV infusion (30 mg/hr Intravenous Rate/Dose Change 05/29/20 0354)  potassium chloride 10 mEq in 100 mL IVPB (10 mEq Intravenous Patient Refused/Not Given 05/29/20 0016)  leflunomide (ARAVA) tablet 20 mg (has no administration in time range)  oxyCODONE (Oxy IR/ROXICODONE) immediate release tablet 20 mg (20 mg Oral Given 05/29/20 0244)  amLODipine (NORVASC) tablet 5 mg (5 mg Oral Given 05/29/20 0112)  atorvastatin (LIPITOR) tablet 20 mg (20 mg Oral Given 05/29/20 0244)  metoprolol succinate (TOPROL-XL) 24 hr tablet 50 mg (has no administration in time range)  spironolactone (ALDACTONE) tablet 25 mg (has no administration in time range)  terazosin (HYTRIN) capsule 10 mg (10 mg Oral Given 05/29/20 0244)  apixaban (ELIQUIS) tablet 5 mg (5 mg Oral Given 05/29/20 0244)  gabapentin (NEURONTIN) capsule 300 mg (has no administration in time range)  potassium chloride (KLOR-CON) CR tablet 10 mEq (10 mEq Oral Given 05/29/20 0243)  albuterol (PROVENTIL) (2.5 MG/3ML) 0.083% nebulizer solution 2.5 mg (has no administration in time range)  montelukast (SINGULAIR) tablet 10 mg (10 mg Oral Given 05/29/20 0244)  sodium chloride flush (NS) 0.9 % injection 3 mL (3 mLs Intravenous Given 05/29/20 0159)  sodium chloride flush (NS) 0.9 % injection 3 mL (has no administration in time range)  0.9 %  sodium chloride infusion (has no administration in time range)  acetaminophen (TYLENOL) tablet 650 mg (has no administration in time range)  ondansetron (ZOFRAN) injection 4 mg (has no administration in time range)  furosemide (  LASIX) injection 80 mg (has no administration in time range)  diltiazem (CARDIZEM) injection 15 mg (15 mg Intravenous Given 05/28/20 2042)  diltiazem (CARDIZEM) injection 25 mg (25 mg Intravenous Given 05/28/20 2058)  amiodarone (NEXTERONE) 1.8 mg/mL load via infusion 150 mg (150 mg Intravenous Bolus from Bag 05/28/20 2148)  potassium chloride SA (KLOR-CON) CR tablet 40 mEq  (40 mEq Oral Given 05/28/20 2148)  furosemide (LASIX) injection 80 mg (80 mg Intravenous Given 05/28/20 2147)     ED Discharge Orders    None       Note:  This document was prepared using Dragon voice recognition software and may include unintentional dictation errors.   Vanessa Clyman, MD 05/29/20 [phone]

## 2020-05-28 NOTE — ED Notes (Signed)
Pt placed on 4L O2 via  for comfort at this time.

## 2020-05-28 NOTE — H&P (Signed)
[facility] History and Physical  Tina Pacheco UUV:[id number] DOB: 1950-05-28 DOA: 05/28/2020  Referring physician: Dr. Jari Pigg PCP: Venia Carbon, MD   Chief Complaint: shortness of breath  HPI: Tina Pacheco is a 70 y.o. female with history of A. fib on Eliquis, diastolic CHF, COPD, nonobstructive CAD, rheumatoid arthritis, hyperlipidemia, hypertension, chronic pain on chronic opioids, who presents after sudden onset of chest pain shortness of breath.  Patient reports that at exactly 7:00 she suddenly felt a pain in her chest, shortness of breath, and pain as if she had been lifting weights in both of her arms.  Not initially but over the next 20 minutes she developed nausea and sweating as well.  She was seen by her cardiologist 4 days ago and at that time had had significant weight gain since last time she had been seen in clinic concerning for volume overload.  Note from that time also mentions that she was in sinus rhythm at 68 bpm.  She was sent home with an increased dose of torsemide 40 as well as metolazone and has noted some improvement in her weight as well as a decrease in swelling in her legs.  Over the last several weeks she denies any dietary indiscretions, missed medications, or episodes of chest pain.  Review of chart shows her last echocardiogram was in November 2021, at that time EF was 60 to 65% with no wall motion abnormalities and normal biatrial size.  Patient also reports cath in 2012 that showed 30% stenoses but no major obstructive CAD.  In the ED initial vital signs notable for significant tachycardia ranging from 120s to 160s as well as hypertension and mild hypoxia responsive to nasal cannula 2 L/min.  Initial labs notable for CMP with barely elevated creatinine at 1.1 and K of 3.4 but otherwise unremarkable.  CBC was unremarkable.  BNP was normal at 57, context being her morbid obesity.  Initial troponin was normal at 17 (drawn at 830), next troponin was  uptrending and abnormal at 28.  Chest x-ray showed mild pulmonary edema.  EKG showed A. fib with RVR without significant ischemic changes.  She was initially treated for RVR with 2 trials of IV diltiazem and briefly converted after each but subsequently reverted to RVR.  She was subsequently started on an amiodarone drip.  After these treatments patient reported feeling significantly better and denied any current chest or arm pain.  She did endorse some mild shortness of breath at the time of admission.  Review of Systems:  Pertinent positives and negative per HPI, all others reviewed and negative  Past Medical History:  Diagnosis Date  . Acute CHF (congestive heart failure) ([place]) 03/30/2019  . Arthritis    RA  . Asthma   . Basal cell carcinoma   . Cataract 2018   bilateral eyes; corrected with surgery  . Claustrophobia   . Collagen vascular disease (HCC)    RA  . COPD (chronic obstructive pulmonary disease) ([place])   . Diastolic dysfunction    a. echo 07/2014: EF 55-60%, no RWMA, GR2DD, mild MR, LA moderately dilated, PASP 38 mm Hg  . Dyslipidemia   . Dysrhythmia   . Headache    migraines  . Hemihypertrophy   . History of cardiac cath    a. 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 History of gout   . Hyperplastic colonic polyp 2003  . Hypertension   . Hypokalemia   . Morbid obesity ([place])   . PAF (  paroxysmal atrial fibrillation) (HCC)    a. on Pradaxa; b. CHADSVASc at least 2 (HTN & female)  . Rheumatoid arthritis(714.0)   . Sleep apnea    a. not compliant with CPAP   Past Surgical History:  Procedure Laterality Date  . ABDOMINAL HYSTERECTOMY    . APPLICATION OF WOUND VAC Right 08/09/2017   Procedure: APPLICATION OF WOUND VAC;  Surgeon: Albertine Patricia, DPM;  Location: [facility] ORS;  Service: Podiatry;  Laterality: Right;  . CARDIAC CATHETERIZATION  05/24/2010   nonobstructive CAD  . CATARACT EXTRACTION W/ INTRAOCULAR LENS IMPLANT Right 06/12/2016   Dr. Darleen Crocker  .  CATARACT EXTRACTION W/ INTRAOCULAR LENS IMPLANT Left 06/26/2016   Dr. Darleen Crocker  . CESAREAN SECTION    . CHOLECYSTECTOMY    . COLONOSCOPY  06/12/2011   Procedure: COLONOSCOPY;  Surgeon: Juanita Craver, MD;  Location: [facility];  Service: Endoscopy;  Laterality: N/A;  . COLONOSCOPY N/A 03/17/2013   Procedure: COLONOSCOPY;  Surgeon: Juanita Craver, MD;  Location: [facility];  Service: Endoscopy;  Laterality: N/A;  . EYE SURGERY    . FOOT ARTHRODESIS Right 07/13/2017   Procedure: ARTHRODESIS FOOT-MULTI.FUSIONS (6 JOINTS);  Surgeon: Albertine Patricia, DPM;  Location: [facility] OR[facility];  Service: Podiatry;  Laterality: Right;  . IRRIGATION AND DEBRIDEMENT FOOT Right 08/09/2017   Procedure: IRRIGATION AND DEBRIDEMENT FOOT;  Surgeon: Albertine Patricia, DPM;  Location: [facility];  Service: Podiatry;  Laterality: Right;  . KNEE ARTHROSCOPY     bilateral  . SINUSOTOMY    . TOOTH EXTRACTION  12/2016  . VAGINAL HYSTERECTOMY     Social History:  reports that she has never smoked. She has never used smokeless tobacco. She reports that she does not drink alcohol and does not use drugs.  Allergies  Allergen Reactions  . Fluoxetine     Headache, shaking, sleep issues  . Codeine     Nausea and vomiting/only when taking too much    Family History  Problem Relation Age of Onset  . Emphysema Father        smoked  . Tuberculosis Mother   . Parkinsonism Mother   . Diabetes type II Sister   . Breast cancer Sister   . Breast cancer Maternal Aunt   . Tuberculosis Sister      Prior to Admission medications   Medication Sig Start Date End Date Taking? Authorizing Provider  albuterol (PROVENTIL) (2.5 MG/3ML) 0.083% nebulizer solution Take 3 mLs (2.5 mg total) by nebulization every 6 (six) hours as needed for wheezing or shortness of breath. 01/08/20   Venia Carbon, MD  amLODipine (NORVASC) 5 MG tablet TAKE 1 TABLET BY MOUTH EVERY DAY IN THE EVENING 09/04/19   Venia Carbon, MD  apixaban (ELIQUIS) 5 MG TABS  tablet Take 1 tablet (5 mg total) by mouth 2 (two) times daily. 05/07/20   Minna Merritts, MD  atorvastatin (LIPITOR) 20 MG tablet TAKE 1 TABLET BY MOUTH DAILY AT 6 PM 01/25/20   Venia Carbon, MD  Certolizumab Pegol [facility]) Inject into the skin every 30 (thirty) days.    [provider]  Crisaborole (EUCRISA) 2 % OINT Apply to aa's BID PRN flares. 02/10/20   Brendolyn Patty, MD  fluconazole (DIFLUCAN) 100 MG tablet Take 1 tablet (100 mg total) by mouth daily. Repeat every few weeks as needed 01/14/20   Venia Carbon, MD  gabapentin (NEURONTIN) 300 MG capsule Take 1 capsule (300 mg total) by mouth 4 (four) times daily. 01/20/20  Venia Carbon, MD  ketoconazole (NIZORAL) 2 % cream Apply 1 application topically 2 (two) times daily. 12/30/19   [provider]  leflunomide (ARAVA) 20 MG tablet Take 20 mg by mouth daily.    [provider]  meclizine (ANTIVERT) 25 MG tablet TAKE 1 TABLET(25 MG) BY MOUTH THREE TIMES DAILY AS NEEDED FOR DIZZINESS 10/20/19   Venia Carbon, MD  metolazone (ZAROXOLYN) 2.5 MG tablet Take 1 tablet (2.5 mg total) by mouth daily as needed. 05/24/20   Minna Merritts, MD  metoprolol succinate (TOPROL-XL) 50 MG 24 hr tablet TAKE 1 TABLET BY MOUTH EVERY DAY WITH OR IMMEDIATELY FOLLOWING A MEAL 01/25/20   Viviana Simpler I, MD  montelukast (SINGULAIR) 10 MG tablet TAKE 1 TABLET BY MOUTH AT BEDTIME 02/02/20   Venia Carbon, MD  mupirocin ointment (BACTROBAN) 2 % Apply 1 application topically 2 (two) times daily. Qd to burn wounds 03/22/20   Brendolyn Patty, MD  nystatin (NYSTATIN) powder Apply 1 application topically 3 (three) times daily as needed.    [provider]  Oxycodone HCl 20 MG TABS Take 1 tablet (20 mg total) by mouth in the morning and at bedtime. Substitute for prev rx 05/27/20   Venia Carbon, MD  polyethylene glycol (MIRALAX / GLYCOLAX) packet Take 17 g by mouth daily as needed for mild constipation. 07/16/17    Henreitta Leber, MD  potassium chloride SA (KLOR-CON) 20 MEQ tablet Take 0.5 tablets (10 mEq total) by mouth 2 (two) times daily. 04/30/20   Minna Merritts, MD  predniSONE (DELTASONE) 5 MG tablet Take 3-4 tablets (15-20 mg total) by mouth daily with breakfast. Wean as directed 03/24/20   Venia Carbon, MD  SF 5000 PLUS 1.1 % CREA dental cream Place 1 application onto teeth 2 (two) times daily. 06/01/17   [provider]  spironolactone (ALDACTONE) 25 MG tablet TAKE 1 TABLET(25 MG) BY MOUTH DAILY 05/17/20   Viviana Simpler I, MD  terazosin (HYTRIN) 10 MG capsule TAKE 1 CAPSULE(10 MG) BY MOUTH AT BEDTIME 01/03/20   Viviana Simpler I, MD  torsemide (DEMADEX) 20 MG tablet Take 40 mg by mouth daily. Takes an extra tablet when feeling SOB or has noticeable edema    [provider]   Physical Exam: Vitals:   05/28/20 2130 05/28/20 2200 05/28/20 2230 05/28/20 2330  BP: (!) 160/148 (!) 157/134 (!) 174/155 (!) 130/113  Pulse: 87 (!) 116 (!) 145 (!) 59  Resp: 10 20 20 12   Temp:      TempSrc:      SpO2: 99% 100% 99% 100%  Weight:      Height:        Wt Readings from Last 3 Encounters:  05/28/20 (!) 144.7 kg  05/24/20 (!) 151.5 kg  05/03/20 (!) 142 kg     . General:  Appears calm and comfortable . Eyes: PERRL, normal lids, irises & conjunctiva . ENT: grossly normal hearing, lips & tongue . Neck: no masses . Cardiovascular: irregular, tachycardic. No pitting LE edema. JVD difficult to assess due to body habitus.  . Telemetry: Afib w RVR . Respiratory: CTA bilaterally, no w/r/r. Normal respiratory effort. . Abdomen: soft, ntnd . Skin: no rash or induration seen on limited exam . Musculoskeletal: grossly normal tone BUE/BLE . Psychiatric: grossly normal mood and affect, speech fluent and appropriate . Neurologic: grossly non-focal.          Labs on Admission:  Basic Metabolic Panel: Recent Labs  Lab 05/28/20 2031  NA 135  K 3.4*  CL 94*  CO2 27  GLUCOSE 127*   BUN 22  CREATININE 1.10*  CALCIUM 10.1  MG 1.7   Liver Function Tests: Recent Labs  Lab 05/28/20 2031  AST 36  ALT 23  ALKPHOS 103  BILITOT 1.6*  PROT 6.9  ALBUMIN 3.9   No results for input(s): LIPASE, AMYLASE in the last 168 hours. No results for input(s): AMMONIA in the last 168 hours. CBC: Recent Labs  Lab 05/28/20 2031  WBC 9.6  NEUTROABS 5.4  HGB 13.6  HCT 42.7  MCV 90.9  PLT 253   Cardiac Enzymes: No results for input(s): CKTOTAL, CKMB, CKMBINDEX, TROPONINI in the last 168 hours.  BNP (last 3 results) Recent Labs    01/05/20 1406 01/28/20 1115 05/28/20 2031  BNP 283.0* 256.1* 57.6    ProBNP (last 3 results) No results for input(s): PROBNP in the last 8760 hours.  CBG: No results for input(s): GLUCAP in the last 168 hours.  Radiological Exams on Admission: DG Chest Portable 1 View  Result Date: 05/28/2020 CLINICAL DATA:  Shortness of breath EXAM: PORTABLE CHEST 1 VIEW COMPARISON:  01/28/2020 FINDINGS: Mild cardiomegaly. Interstitial opacities, likely mild pulmonary edema. No lobar consolidation. No pneumothorax or pleural effusion. IMPRESSION: Cardiomegaly mild interstitial pulmonary edema. Electronically Signed   By: Ulyses Jarred M.D.   On: 05/28/2020 20:50    EKG: Independently reviewed. Afib w RVR, no significant change besides rate from prior  Assessment/Plan Active Problems:   Morbid obesity (HCC)   HLD (hyperlipidemia)   HTN (hypertension)   Rheumatoid arthritis (HCC)   Chronic pain syndrome   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)   COPD (chronic obstructive pulmonary disease) (HCC)   Chronic diastolic heart failure (HCC)   Atrial fibrillation with RVR (HCC)   Tina Pacheco is a 70 y.o. female with history of A. fib on Eliquis, diastolic CHF, COPD, nonobstructive CAD, rheumatoid arthritis, hyperlipidemia, hypertension, chronic pain on chronic opioids, who presents after sudden onset of  chest pain shortness of breath and found to be in acute heart failure exacerbation and A. fib with RVR.  #Acute on chronic diastolic heart failure #A. fib with RVR #Elevated troponin Patient presenting with history of several weeks to months of weight gain and sudden episode of onset of shortness of breath and chest pain.  Suspect that her chronically progressive volume overload from her CHF has set off an episode of A. fib with RVR which precipitated her symptoms.  BNP is normal but must be interpreted with caution in setting of her morbid obesity.  Failed 2 trials of IV diltiazem and now trialing amiodarone for rhythm control.  Suspect that her RVR will not improve until she diuresis further.  Still unclear however why she has had progressive weight gain, reports compliance with diet/medications and no recent illnesses, she may need an ischemic evaluation once she is euvolemic.  Troponin currently mildly positive and uptrending, patient is asymptomatic at this time, suspect that this is due to demand in setting of RVR. -IV Lasix 80 mg twice daily -Continue amiodarone gtt -Strict I&O, daily weights -Telemetry -Consult cardiology in a.m. -TTE ordered -Trend troponin -Continue apixaban   #Known medical problems COPD-continue albuterol Hypertension-continue amlodipine, metoprolol, spironolactone, Terazosin Hyperlipidemia-continue atorvastatin Neuropathy-continue gabapentin Rheumatoid arthritis-continue leflunomide Chronic pain-continue oxycodone 20 mg twice daily, PMP confirmed   Code Status: Full code, confirmed DVT Prophylaxis: On full dose anticoagulation Family Communication: Daughter  updated at bedside Disposition Plan: Inpatient, progressive cardiac  Time spent: 82 min  Clarnce Flock MD/[facility]  Note:  This document was prepared using Set designer software and may include unintentional dictation errors.

## 2020-05-28 NOTE — ED Triage Notes (Signed)
Pt BIB EMS from home for complaints of substernal chest pain and shortness of breath.  EMS found afib rvr on 12 lead.  Pt took 162 of ASA at home.

## 2020-05-28 NOTE — ED Notes (Signed)
Report received from [facility]

## 2020-05-29 ENCOUNTER — Inpatient Hospital Stay (HOSPITAL_COMMUNITY)
Admit: 2020-05-29 | Discharge: 2020-05-29 | Disposition: A | Discharge status: Home / Self Care | Payer: Medicare Other | Attending: Family Medicine | Admitting: Family Medicine

## 2020-05-29 ENCOUNTER — Other Ambulatory Visit: Payer: Self-pay

## 2020-05-29 ENCOUNTER — Encounter: Payer: Self-pay | Admitting: Family Medicine

## 2020-05-29 DIAGNOSIS — I5031 Acute diastolic (congestive) heart failure: Secondary | ICD-10-CM

## 2020-05-29 DIAGNOSIS — E782 Mixed hyperlipidemia: Secondary | ICD-10-CM | POA: Diagnosis not present

## 2020-05-29 DIAGNOSIS — I509 Heart failure, unspecified: Secondary | ICD-10-CM | POA: Diagnosis not present

## 2020-05-29 DIAGNOSIS — J449 Chronic obstructive pulmonary disease, unspecified: Secondary | ICD-10-CM | POA: Diagnosis not present

## 2020-05-29 DIAGNOSIS — I4891 Unspecified atrial fibrillation: Secondary | ICD-10-CM | POA: Diagnosis not present

## 2020-05-29 LAB — TROPONIN I (HIGH SENSITIVITY)
Troponin I (High Sensitivity): 35 ng/L — ABNORMAL HIGH (ref <18)
Troponin I (High Sensitivity): 37 ng/L — ABNORMAL HIGH (ref <18)

## 2020-05-29 LAB — BASIC METABOLIC PANEL
Anion gap: 16 — ABNORMAL HIGH (ref 5–15)
BUN: 20 mg/dL (ref 8–23)
CO2: 25 mmol/L (ref 22–32)
Calcium: 10.1 mg/dL (ref 8.9–10.3)
Chloride: 96 mmol/L — ABNORMAL LOW (ref 98–111)
Creatinine, Ser: 1.09 mg/dL — ABNORMAL HIGH (ref 0.44–1.00)
GFR, Estimated: 55 mL/min — ABNORMAL LOW (ref >60)
Glucose, Bld: 129 mg/dL — ABNORMAL HIGH (ref 70–99)
Potassium: 3.5 mmol/L (ref 3.5–5.1)
Sodium: 137 mmol/L (ref 135–145)

## 2020-05-29 LAB — HIV ANTIBODY (ROUTINE TESTING W REFLEX): HIV Screen 4th Generation wRfx: NONREACTIVE

## 2020-05-29 LAB — SARS CORONAVIRUS 2 (TAT 6-24 HRS): SARS Coronavirus 2: NEGATIVE

## 2020-05-29 MED ORDER — POTASSIUM CHLORIDE CRYS ER 10 MEQ PO TBCR
10.0000 meq | EXTENDED_RELEASE_TABLET | Freq: Two times a day (BID) | ORAL | Status: DC
  Administered 2020-05-29 – 2020-05-30 (×5): 10 meq via ORAL
  Filled 2020-05-29 (×5): qty 1

## 2020-05-29 MED ORDER — FUROSEMIDE 10 MG/ML IJ SOLN
80.0000 mg | Freq: Two times a day (BID) | INTRAMUSCULAR | Status: DC
  Administered 2020-05-29 – 2020-05-30 (×3): 80 mg via INTRAVENOUS
  Filled 2020-05-29 (×3): qty 8

## 2020-05-29 MED ORDER — SPIRONOLACTONE 25 MG PO TABS
25.0000 mg | ORAL_TABLET | Freq: Every day | ORAL | Status: DC
  Administered 2020-05-29 – 2020-06-08 (×11): 25 mg via ORAL
  Filled 2020-05-29 (×11): qty 1

## 2020-05-29 MED ORDER — AMLODIPINE BESYLATE 5 MG PO TABS
5.0000 mg | ORAL_TABLET | Freq: Every evening | ORAL | Status: DC
  Administered 2020-05-29 – 2020-05-31 (×4): 5 mg via ORAL
  Filled 2020-05-29 (×4): qty 1

## 2020-05-29 MED ORDER — FUROSEMIDE 10 MG/ML IJ SOLN
40.0000 mg | Freq: Two times a day (BID) | INTRAMUSCULAR | Status: DC
Start: 2020-05-29 — End: 2020-05-29

## 2020-05-29 MED ORDER — SODIUM CHLORIDE 0.9% FLUSH
3.0000 mL | Freq: Two times a day (BID) | INTRAVENOUS | Status: DC
  Administered 2020-05-29 – 2020-06-03 (×12): 3 mL via INTRAVENOUS

## 2020-05-29 MED ORDER — ALBUTEROL SULFATE (2.5 MG/3ML) 0.083% IN NEBU: 2.5000 mg | INHALATION_SOLUTION | Freq: Four times a day (QID) | RESPIRATORY_TRACT | Status: DC | PRN

## 2020-05-29 MED ORDER — TERAZOSIN HCL 5 MG PO CAPS
10.0000 mg | ORAL_CAPSULE | Freq: Every day | ORAL | Status: DC
  Administered 2020-05-29 – 2020-06-07 (×11): 10 mg via ORAL
  Filled 2020-05-29 (×12): qty 2

## 2020-05-29 MED ORDER — SODIUM CHLORIDE 0.9 % IV SOLN: 250.0000 mL | INTRAVENOUS | Status: DC | PRN

## 2020-05-29 MED ORDER — AMIODARONE HCL 200 MG PO TABS
400.0000 mg | ORAL_TABLET | Freq: Two times a day (BID) | ORAL | Status: AC
  Administered 2020-05-29 – 2020-06-03 (×11): 400 mg via ORAL
  Filled 2020-05-29 (×10): qty 2

## 2020-05-29 MED ORDER — ONDANSETRON HCL 4 MG/2ML IJ SOLN
4.0000 mg | Freq: Four times a day (QID) | INTRAMUSCULAR | Status: DC | PRN
  Administered 2020-05-30 – 2020-05-31 (×2): 4 mg via INTRAVENOUS
  Filled 2020-05-29 (×2): qty 2

## 2020-05-29 MED ORDER — APIXABAN 5 MG PO TABS
5.0000 mg | ORAL_TABLET | Freq: Two times a day (BID) | ORAL | Status: DC
  Administered 2020-05-29 – 2020-06-01 (×8): 5 mg via ORAL
  Filled 2020-05-29 (×9): qty 1

## 2020-05-29 MED ORDER — FUROSEMIDE 10 MG/ML IJ SOLN
80.0000 mg | Freq: Two times a day (BID) | INTRAMUSCULAR | Status: DC
  Administered 2020-05-29: 80 mg via INTRAVENOUS
  Filled 2020-05-29: qty 8

## 2020-05-29 MED ORDER — ATORVASTATIN CALCIUM 20 MG PO TABS
20.0000 mg | ORAL_TABLET | Freq: Every day | ORAL | Status: DC
  Administered 2020-05-29 – 2020-06-07 (×11): 20 mg via ORAL
  Filled 2020-05-29: qty 2
  Filled 2020-05-29 (×10): qty 1

## 2020-05-29 MED ORDER — OXYCODONE HCL 5 MG PO TABS
20.0000 mg | ORAL_TABLET | Freq: Two times a day (BID) | ORAL | Status: DC
  Administered 2020-05-29 – 2020-06-08 (×22): 20 mg via ORAL
  Filled 2020-05-29 (×22): qty 4

## 2020-05-29 MED ORDER — MONTELUKAST SODIUM 10 MG PO TABS
10.0000 mg | ORAL_TABLET | Freq: Every day | ORAL | Status: DC
  Administered 2020-05-29 – 2020-06-07 (×11): 10 mg via ORAL
  Filled 2020-05-29 (×11): qty 1

## 2020-05-29 MED ORDER — ACETAMINOPHEN 325 MG PO TABS
650.0000 mg | ORAL_TABLET | ORAL | Status: DC | PRN
  Administered 2020-05-29 – 2020-05-30 (×2): 650 mg via ORAL
  Filled 2020-05-29 (×2): qty 2

## 2020-05-29 MED ORDER — SODIUM CHLORIDE 0.9% FLUSH: 3.0000 mL | INTRAVENOUS | Status: DC | PRN

## 2020-05-29 MED ORDER — GABAPENTIN 300 MG PO CAPS
300.0000 mg | ORAL_CAPSULE | Freq: Four times a day (QID) | ORAL | Status: DC
  Administered 2020-05-29 – 2020-06-08 (×27): 300 mg via ORAL
  Filled 2020-05-29 (×36): qty 1

## 2020-05-29 MED ORDER — METOPROLOL SUCCINATE ER 50 MG PO TB24
50.0000 mg | ORAL_TABLET | Freq: Every day | ORAL | Status: DC
  Administered 2020-05-29 – 2020-06-03 (×6): 50 mg via ORAL
  Filled 2020-05-29 (×6): qty 1

## 2020-05-29 MED ORDER — SILVER SULFADIAZINE 1 % EX CREA
TOPICAL_CREAM | Freq: Two times a day (BID) | CUTANEOUS | Status: DC
  Filled 2020-05-29: qty 85

## 2020-05-29 MED ORDER — LEFLUNOMIDE 20 MG PO TABS
20.0000 mg | ORAL_TABLET | Freq: Every day | ORAL | Status: DC
  Administered 2020-05-29 – 2020-06-08 (×10): 20 mg via ORAL
  Filled 2020-05-29 (×11): qty 1

## 2020-05-29 NOTE — Consult Note (Signed)
  Amiodarone Drug - Drug Interaction Consult Note  Recommendations: 05/28/20: QT/QTcB 316/508 Apixaban: benefits outweigh risks at current time, will continue current regimen Diltiazem: 3 doses given, no further doses intended/scheduled currently Ondansetron: ordered as PRN, no doses currently given. Will continue to monitor.  Amiodarone is metabolized by the cytochrome P450 system and therefore has the potential to cause many drug interactions. Amiodarone has an average plasma half-life of 50 days (range 20 to 100 days).   There is potential for drug interactions to occur several weeks or months after stopping treatment and the onset of drug interactions may be slow after initiating amiodarone.   []  Statins: Increased risk of myopathy. Simvastatin- restrict dose to 20mg  daily. Other statins: counsel patients to report any muscle pain or weakness immediately.  [x]  Anticoagulants: Amiodarone can increase anticoagulant effect. Consider warfarin dose reduction. Patients should be monitored closely and the dose of anticoagulant altered accordingly, remembering that amiodarone levels take several weeks to stabilize.  []  Antiepileptics: Amiodarone can increase plasma concentration of phenytoin, the dose should be reduced. Note that small changes in phenytoin dose can result in large changes in levels. Monitor patient and counsel on signs of toxicity.  []  Beta blockers: increased risk of bradycardia, AV block and myocardial depression. Sotalol - avoid concomitant use.  [x]   Calcium channel blockers (diltiazem and verapamil): increased risk of bradycardia, AV block and myocardial depression.  []   Cyclosporine: Amiodarone increases levels of cyclosporine. Reduced dose of cyclosporine is recommended.  []  Digoxin dose should be halved when amiodarone is started.  []  Diuretics: increased risk of cardiotoxicity if hypokalemia occurs.  []  Oral hypoglycemic agents (glyburide, glipizide, glimepiride):  increased risk of hypoglycemia. Patient's glucose levels should be monitored closely when initiating amiodarone therapy.   [x]  Drugs that prolong the QT interval:  Torsades de pointes risk may be increased with concurrent use - avoid if possible.  Monitor QTc, also keep magnesium/potassium WNL if concurrent therapy can't be avoided. Marland Kitchen Antibiotics: e.g. fluoroquinolones, erythromycin. . Antiarrhythmics: e.g. quinidine, procainamide, disopyramide, sotalol. . Antipsychotics: e.g. phenothiazines, haloperidol.  . Lithium, tricyclic antidepressants, and methadone. Thank You,  Pearla Dubonnet  05/29/2020 12:08 PM

## 2020-05-29 NOTE — Progress Notes (Signed)
PT Cancellation Note  Patient Details Name: Tina Pacheco MRN: [id number] DOB: [birth year]   Cancelled Treatment:    Reason Eval/Treat Not Completed: Patient not medically ready. PT orders received, chart reviewed. Pt new to IV amiodarone drip (meds started at 05/29/20 0345) & per therapy protocols pt needs to be on IV meds for 24 hrs before initiating PT evaluation. Will f/u as able & as pt is medically appropriate to participate.  Lavone Nian, PT, DPT 05/29/20, 1:11 PM    [name] 05/29/2020, 1:09 PM

## 2020-05-29 NOTE — ED Notes (Signed)
Messaged [facility] MD regarding pt's HR still high on amiodarone. Pt denies chest pain. Endorses some SOB

## 2020-05-29 NOTE — ED Notes (Signed)
Pt now NSR on monitor. Pt to Rm 234, this RN to transport

## 2020-05-29 NOTE — ED Notes (Signed)
Called floor to give report. Floor unable to take pt with this blood pressure. Damita Dunnings MD contacted regarding HR and BP

## 2020-05-29 NOTE — Progress Notes (Signed)
PROGRESS NOTE  Tina Pacheco XHB:[id number] DOB: Jun 16, 1950 DOA: 05/28/2020 PCP: Venia Carbon, MD   LOS: 1 day   Brief Narrative / Interim history: 70 year old female with history of paroxysmal A. fib on Eliquis, diastolic CHF, COPD, nonobstructive CAD, RA, HTN, HLD, chronic pain, obesity comes into the hospital with sudden onset of chest pain shortness of breath.  Over the last week or more she has noticed that she is having more weight gain, saw by her cardiologist 4 days ago at that time given significant weight gain her torsemide dose was increased.  She was in sinus rhythm in the office.  She has noticed improvement in her weight but all of a sudden prior to admission experienced chest pain, shortness of breath, upper body heaviness and came to the ER.  In the ER she was found to be fluid overloaded, chest x-ray showed mild pulmonary edema and she was in A. fib with RVR.  She did not respond to diltiazem and was placed on amiodarone infusion.  She was given IV Lasix and admitted to the hospital.  Subjective / 24h Interval events: Feels better this morning, she denies any chest pain, breathing feels fine at rest.  She has not been up.  Assessment & Plan: Principal Problem Acute hypoxic respiratory failure due to acute pulmonary edema in the setting of acute on chronic diastolic heart failure -Long discussion with the patient at bedside this morning, she is compliant with all her medications however it seems that at times she is struggling with her diet and also with her fluid intake.  Suspect this may be part of the problem -She has been started on IV Lasix, she is responding well, continue.  -She is net -2 L already, her weight is 315 this morning from 319 yesterday.  Of note, at her peak last week she was 332, per patient, at home -A repeat 2D echo is pending  Active Problems Paroxysmal A. fib, now with RVR -Patient has been started on amiodarone infusion given the fact that she did  not respond to IV diltiazem.  She is chronically anticoagulated with Eliquis.  She is now alternating between sinus rhythm and rate controlled A. fib with rates in the 70s-90s.  Discussed with Dr. Curt Bears with cardiology, he recommends to continue amiodarone, will do an oral load 400 mg BID for 2 weeks then 200 mg daily.  He will ensure that she has outpatient follow-up shortly after discharge  Elevated troponin -Overall flat, not in a pattern consistent with ACS, no chest pain.  2D echo pending, if significant abnormalities are present, consult cardiology  COPD -No wheezing, this appears stable  Essential hypertension -Continue home medications  Hyperlipidemia-continue atorvastatin  Rheumatoid arthritis, chronic pain-continue home medications  Scheduled Meds: . amLODipine  5 mg Oral QPM  . apixaban  5 mg Oral BID  . atorvastatin  20 mg Oral QHS  . furosemide  80 mg Intravenous BID  . gabapentin  300 mg Oral QID  . leflunomide  20 mg Oral Daily  . metoprolol succinate  50 mg Oral Daily  . montelukast  10 mg Oral QHS  . oxyCODONE  20 mg Oral BID  . potassium chloride SA  10 mEq Oral BID  . sodium chloride flush  3 mL Intravenous Q12H  . spironolactone  25 mg Oral Daily  . terazosin  10 mg Oral QHS   Continuous Infusions: . sodium chloride    . amiodarone 30 mg/hr (05/29/20 1023)   PRN  Meds:.sodium chloride, acetaminophen, albuterol, ondansetron (ZOFRAN) IV, sodium chloride flush  Diet Orders (From admission, onward)    Start     Ordered   05/29/20 0032  Diet Heart Room service appropriate? Yes; Fluid consistency: Thin  Diet effective now       Question Answer Comment  Room service appropriate? Yes   Fluid consistency: Thin      05/29/20 0031          DVT prophylaxis:  apixaban (ELIQUIS) tablet 5 mg     Code Status: Full Code  Family Communication: no family present   Status is: Inpatient  Remains inpatient appropriate because:Inpatient level of care  appropriate due to severity of illness   Dispo: The patient is from: Home              Anticipated d/c is to: Home              Patient currently is not medically stable to d/c.   Difficult to place patient No  Level of care: Progressive Cardiac  Consultants:  None   Procedures:  2D echo: pending  Microbiology  none  Antimicrobials: none    Objective: Vitals:   05/29/20 0400 05/29/20 0417 05/29/20 0804 05/29/20 1044  BP: (!) 143/69  (!) 142/68   Pulse: 85  79   Resp: 17  18   Temp:   98.1 F (36.7 C)   TempSrc:   Oral   SpO2: 97%  93%   Weight:  (!) 144.7 kg  (!) 143.2 kg  Height:        Intake/Output Summary (Last 24 hours) at 05/29/2020 1058 Last data filed at 05/29/2020 1023 Gross per 24 hour  Intake 365.99 ml  Output 2400 ml  Net -2034.01 ml   Filed Weights   05/29/20 0032 05/29/20 0417 05/29/20 1044  Weight: (!) 144.7 kg (!) 144.7 kg (!) 143.2 kg    Examination:  Constitutional: NAD Eyes: no scleral icterus ENMT: Mucous membranes are moist.  Neck: normal, supple Respiratory: Overall distant breath sounds due to body habitus, no wheezing, no crackles. Normal respiratory effort. No accessory muscle use.  Cardiovascular: Regular rate and rhythm, no murmurs / rubs / gallops.  1+ LE edema. Good peripheral pulses Abdomen: non distended, no tenderness. Bowel sounds positive.  Musculoskeletal: no clubbing / cyanosis.  Skin: no rashes Neurologic: No focal deficits, equal strength Psychiatric: Normal judgment and insight. Alert and oriented x 3. Normal mood.    Data Reviewed: I have independently reviewed following labs and imaging studies   CBC: Recent Labs  Lab 05/28/20 2031  WBC 9.6  NEUTROABS 5.4  HGB 13.6  HCT 42.7  MCV 90.9  PLT 355   Basic Metabolic Panel: Recent Labs  Lab 05/28/20 2031 05/29/20 0243  NA 135 137  K 3.4* 3.5  CL 94* 96*  CO2 27 25  GLUCOSE 127* 129*  BUN 22 20  CREATININE 1.10* 1.09*  CALCIUM 10.1 10.1  MG 1.7  --     Liver Function Tests: Recent Labs  Lab 05/28/20 2031  AST 36  ALT 23  ALKPHOS 103  BILITOT 1.6*  PROT 6.9  ALBUMIN 3.9   Coagulation Profile: Recent Labs  Lab 05/28/20 2031  INR 1.2   HbA1C: No results for input(s): HGBA1C in the last 72 hours. CBG: No results for input(s): GLUCAP in the last 168 hours.  Recent Results (from the past 240 hour(s))  SARS CORONAVIRUS 2 (TAT 6-24 HRS) Nasopharyngeal Nasopharyngeal Swab  Status: None   Collection Time: 05/28/20 11:06 PM   Specimen: Nasopharyngeal Swab  Result Value Ref Range Status   SARS Coronavirus 2 NEGATIVE NEGATIVE Final    Comment: (NOTE) SARS-CoV-2 target nucleic acids are NOT DETECTED.  The SARS-CoV-2 RNA is generally detectable in upper and lower respiratory specimens during the acute phase of infection. Negative results do not preclude SARS-CoV-2 infection, do not rule out co-infections with other pathogens, and should not be used as the sole basis for treatment or other patient management decisions. Negative results must be combined with clinical observations, patient history, and epidemiological information. The expected result is Negative.  Fact Sheet for Patients: SugarRoll.be  Fact Sheet for Healthcare Providers: https://www.woods-mathews.com/  This test is not yet approved or cleared by the [place] FDA and  has been authorized for detection and/or diagnosis of SARS-CoV-2 by FDA under an Emergency Use Authorization (EUA). This EUA will remain  in effect (meaning this test can be used) for the duration of the COVID-19 declaration under Se ction 564(b)(1) of the Act, 21 U.S.C. section 360bbb-3(b)(1), unless the authorization is terminated or revoked sooner.  Performed at [facility], [place] [address], [place], [place] [address]      Radiology Studies: DG Chest Portable 1 View  Result Date: 05/28/2020 CLINICAL DATA:  Shortness of breath  EXAM: PORTABLE CHEST 1 VIEW COMPARISON:  01/28/2020 FINDINGS: Mild cardiomegaly. Interstitial opacities, likely mild pulmonary edema. No lobar consolidation. No pneumothorax or pleural effusion. IMPRESSION: Cardiomegaly mild interstitial pulmonary edema. Electronically Signed   By: Ulyses Jarred M.D.   On: 05/28/2020 20:50   Marzetta Board, MD, PhD [facility]  Between 7 am - 7 pm I am available, please contact me via Amion (for emergencies) or Securechat (non urgent messages)  Between 7 pm - 7 am I am not available, please contact night coverage MD/APP via Amion

## 2020-05-29 NOTE — ED Notes (Signed)
ED secretary paging Dione Plover MD. Per secretary MD is not here and Damita Dunnings MD has assumed care of pt

## 2020-05-29 NOTE — Progress Notes (Signed)
Patient arrived to unit in no distress. VSS 

## 2020-05-29 NOTE — Progress Notes (Signed)
Message to Wound Care RN via secure chat: I measured wounds and documented them in [place]. Did not see space for measurements so I added it into the comments section. Supply Chain closed early so unable to get Interdry; placed our wicking pillow cases between abdominal folds that had been cleansed. Will obtain interdry tomorrow from supply chain. Patient sees dermatologist OP for MASD treatment.

## 2020-05-29 NOTE — ED Notes (Signed)
Pt reports potassium infusion is burning. Decreased rate, pt still endorses severe burning. States she will take however many pills she needs but does not want IV potassium

## 2020-05-29 NOTE — Plan of Care (Signed)
  Problem: Education: Goal: Knowledge of disease or condition will improve Outcome: Progressing   Problem: Respiratory: Goal: Levels of oxygenation will improve Outcome: Progressing   

## 2020-05-29 NOTE — Progress Notes (Signed)
*  PRELIMINARY RESULTS* Echocardiogram 2D Echocardiogram has been performed.  Tina Pacheco 05/29/2020, 3:47 PM
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
## 2020-05-29 NOTE — ED Notes (Signed)
Pt's BP cuff on L left arm for previous BP documented by this RN. Placed larger cuff and moved it up to L upper arm. BP with wider pulse pressure now. Will give ordered amlodipine per floor RN

## 2020-05-30 ENCOUNTER — Encounter: Payer: Self-pay | Admitting: Family Medicine

## 2020-05-30 DIAGNOSIS — E782 Mixed hyperlipidemia: Secondary | ICD-10-CM | POA: Diagnosis not present

## 2020-05-30 DIAGNOSIS — J449 Chronic obstructive pulmonary disease, unspecified: Secondary | ICD-10-CM | POA: Diagnosis not present

## 2020-05-30 DIAGNOSIS — I4891 Unspecified atrial fibrillation: Secondary | ICD-10-CM | POA: Diagnosis not present

## 2020-05-30 DIAGNOSIS — E785 Hyperlipidemia, unspecified: Secondary | ICD-10-CM

## 2020-05-30 DIAGNOSIS — I509 Heart failure, unspecified: Secondary | ICD-10-CM | POA: Diagnosis not present

## 2020-05-30 DIAGNOSIS — R778 Other specified abnormalities of plasma proteins: Secondary | ICD-10-CM

## 2020-05-30 DIAGNOSIS — R079 Chest pain, unspecified: Secondary | ICD-10-CM

## 2020-05-30 LAB — ECHOCARDIOGRAM COMPLETE
AR max vel: 2.01 cm2
AV Peak grad: 8 mmHg
Ao pk vel: 1.41 m/s
Area-P 1/2: 2.5 cm2
Height: 63 in
S' Lateral: 3.01 cm
Weight: 5103.98 oz

## 2020-05-30 LAB — BASIC METABOLIC PANEL
Anion gap: 14 (ref 5–15)
BUN: 26 mg/dL — ABNORMAL HIGH (ref 8–23)
CO2: 27 mmol/L (ref 22–32)
Calcium: 9.7 mg/dL (ref 8.9–10.3)
Chloride: 92 mmol/L — ABNORMAL LOW (ref 98–111)
Creatinine, Ser: 1.13 mg/dL — ABNORMAL HIGH (ref 0.44–1.00)
GFR, Estimated: 52 mL/min — ABNORMAL LOW (ref >60)
Glucose, Bld: 98 mg/dL (ref 70–99)
Potassium: 3.9 mmol/L (ref 3.5–5.1)
Sodium: 133 mmol/L — ABNORMAL LOW (ref 135–145)

## 2020-05-30 LAB — TROPONIN I (HIGH SENSITIVITY): Troponin I (High Sensitivity): 37 ng/L — ABNORMAL HIGH (ref <18)

## 2020-05-30 MED ORDER — AMIODARONE HCL IN DEXTROSE 360-4.14 MG/200ML-% IV SOLN
30.0000 mg/h | INTRAVENOUS | Status: DC
  Administered 2020-05-30 – 2020-05-31 (×3): 30 mg/h via INTRAVENOUS
  Filled 2020-05-30 (×2): qty 200

## 2020-05-30 MED ORDER — SODIUM CHLORIDE 0.9 % IV BOLUS
500.0000 mL | Freq: Once | INTRAVENOUS | Status: AC
  Administered 2020-05-30: 500 mL via INTRAVENOUS

## 2020-05-30 MED ORDER — PREDNISONE 10 MG PO TABS
5.0000 mg | ORAL_TABLET | Freq: Every day | ORAL | Status: DC
  Administered 2020-05-30 – 2020-06-08 (×10): 5 mg via ORAL
  Filled 2020-05-30 (×10): qty 1

## 2020-05-30 MED ORDER — NITROGLYCERIN 0.4 MG SL SUBL
SUBLINGUAL_TABLET | SUBLINGUAL | Status: AC
  Administered 2020-05-30: 0.4 mg
  Filled 2020-05-30: qty 3

## 2020-05-30 MED ORDER — AMIODARONE HCL IN DEXTROSE 360-4.14 MG/200ML-% IV SOLN: 60.0000 mg/h | INTRAVENOUS | Status: AC

## 2020-05-30 MED ORDER — AMIODARONE HCL IN DEXTROSE 360-4.14 MG/200ML-% IV SOLN
INTRAVENOUS | Status: AC
  Administered 2020-05-30: 60 mg/h via INTRAVENOUS
  Filled 2020-05-30: qty 200

## 2020-05-30 MED ORDER — NITROGLYCERIN 0.4 MG SL SUBL
0.4000 mg | SUBLINGUAL_TABLET | SUBLINGUAL | Status: DC | PRN
  Administered 2020-05-30: 0.4 mg via SUBLINGUAL

## 2020-05-30 NOTE — Progress Notes (Signed)
PT Cancellation Note  Patient Details Name: Tina Pacheco MRN: [id number] DOB: 08/22/1950   Cancelled Treatment:    Reason Eval/Treat Not Completed: Patient not medically ready PT orders received, chart reviewed. Pt noted to have rapid response called this morning & re-started on new amiodarone drip (05/30/20 at 0745 AM). Per therapy protocols, will hold PT intervention until 24 hrs after initiation of amiodarone drip. Will f/u as able & as pt is medically able to participate.  Lavone Nian, PT, DPT 05/30/20, 11:20 AM    [name] 05/30/2020, 11:19 AM

## 2020-05-30 NOTE — Progress Notes (Signed)
Rapid Response Event Note   Reason for Call : Unrelieved chest pain   Initial Focused Assessment: RN called to patients room by patient for c/o Chest pain 10/10. Chest pain protocol implemented: nitro 0.4mg  SL administered, 12 Lead ECG completed and showed Afib RVR. Patient initially stated the nitro was working a little then a few moments later reported the pain was getting worse.  On-call cardiologist called, spoke to Dr.  Josefa Half who informs this nurse was calling the wrong cardiology group. RN paged cardiologist Dr. Neena Rhymes and then paged house coverage  Dr. Damita Dunnings. after the second dose on nitro the blood pressure dropped top 74/60, patient reported feeling dizzy like she was going to pass out and vomit. Rapid response called at that time. Meanwhile, Dr. Damita Dunnings called back and gave orders for 500cc bolus. A few mins later Dr. Neena Rhymes called back and ordered to restart the Amio grip. Orders noted and carried out. Drip now stared. Patient reports some relief. Report given ot oncoming RN .       Interventions:    Plan of Care:     Event Summary:   MD Notified:  Call Time: Arrival Time: End Time:  Gerhard Perches, RN

## 2020-05-30 NOTE — Consult Note (Addendum)
Cardiology Consultation:   Patient ID: Tina Pacheco; [id number]; [birth year]   Admit date: 05/28/2020 Date of Consult: 05/30/2020  Primary Care Provider: Venia Carbon, MD Primary Cardiologist: Rockey Situ Primary Electrophysiologist:  None   Patient Profile:   Tina Pacheco is a 70 y.o. female with a hx of nonobstructive CAD, HFpEF, PAF on Eliquis, HTN, HLD, COPD, rheumatoid arthritis, falls, morbid obesity, and OSA who is being seen today for the evaluation of Afib with RVR at the request of Dr. Cruzita Lederer.  History of Present Illness:   Tina Pacheco underwent cardiac cath in 2012 which showed nonobstructive CAD.  She was diagnosed with A. fib in 2012 and has been anticoagulated with DOAC.  Echo in 2016 showed an EF of 55 to 60%, no regional wall motion normalities, grade 2 diastolic dysfunction, and mild mitral regurgitation.  She was admitted in 02/2019 with weakness and multiple falls and acute on chronic diastolic CHF.  Echo showed an EF of 55 to 60%, mild LVH, grade 1 diastolic dysfunction, no regional wall motion abnormalities, mildly to moderately dilated left atrium, moderate mitral valve calcification without regurgitation or stenosis.  She was admitted in 12/2019 with volume overload and weight gain.  She was IV diuresed.  Echo showed an EF of 60 to 65%, no regional wall motion abnormalities, mild LVH, normal LV diastolic function parameters, normal RV systolic function and ventricular cavity size, trivial mitral regurgitation, and trivial aortic valve insufficiency.  She was most recently seen by her primary cardiologist on 05/24/2020 with her weight being up 25 pounds when compared to her visit in our office in 01/2020.  She reported she was taking torsemide 40 mg daily sometimes up to 60 mg daily with no significant improvement in her swelling.  She was maintaining sinus rhythm and continued on Toprol-XL and Eliquis with regards to her A. fib.  With regards to her weight gain she  was continued on torsemide 40 mg daily with recommendation to take an extra torsemide in the afternoon as needed.  She was also advised to take metolazone 2.5 mg sparingly.  It was noted she was struggling with a low-sodium diet.  She was admitted on 05/28/2020 with acute hypoxic respiratory failure due to pulmonary edema in the setting of acute on chronic diastolic CHF and A. fib with RVR.  She reported some improvement in her volume status following the above recommendations at her visit on 3/28.  In the ED she was felt to be volume overloaded with chest x-ray showing mild pulmonary edema and was noted to be in A. fib with RVR which did not respond to diltiazem prompting amiodarone drip.  With amiodarone drip she was alternating between sinus rhythm and rate controlled A. fib.  Case was discussed with Dr. Curt Bears with recommendation to transition from IV amiodarone to oral and atypical loading fashion in an effort to maintain sinus rhythm.  With regards to her volume overload she was IV diuresed with improvement in volume status.  High-sensitivity troponin was mildly elevated peaking at 37 and felt to be supply demand ischemia.  Echo has been performed and is pending.  In the early morning hours of 4/3 patient complained of 10 out of 10 chest pain.  Repeat troponin at that time was unchanged at 37.  She was noted to be back in Afib with RVR, which began around 6 AM.  She was given SL NTG with some improvement in symptoms though given recurrence of chest pain she was  given a second SL NTG with subsequent drop in blood pressure to 74/60 with associated dizziness and presyncope.  In this setting she was given 500 cc normal saline bolus.  After discussing the case with cardiology it was recommended the patient be restarted on amnio drip.  With this, she converted back to sinus rhythm around 8:30 AM and has maintained sinus rhythm since. She does note, the reapplied supplemental oxygen seemed to help with her symptoms  as well. She has a documented UOP of 4.5 L for the admission, though reports a UOP of 6-7 L. Weight trend 334 (3/28) to 317 pounds today.  Currently, chest pain free.    Past Medical History:  Diagnosis Date  . Arthritis    RA  . Asthma   . Basal cell carcinoma   . Cataract 2018   bilateral eyes; corrected with surgery  . Claustrophobia   . Collagen vascular disease (HCC)    RA  . COPD (chronic obstructive pulmonary disease) ([place])   . Diastolic dysfunction    a. echo 07/2014: EF 55-60%, no RWMA, GR2DD, mild MR, LA moderately dilated, PASP 38 mm Hg  . Dyslipidemia   . Headache    migraines  . Hemihypertrophy   . History of cardiac cath    a. 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 History of gout   . Hyperplastic colonic polyp 2003  . Hypertension   . Hypokalemia   . Morbid obesity ([place])   . PAF (paroxysmal atrial fibrillation) (HCC)    a. on Pradaxa; b. CHADSVASc at least 2 (HTN & female)  . Rheumatoid arthritis(714.0)   . Sleep apnea    a. not compliant with CPAP    Past Surgical History:  Procedure Laterality Date  . ABDOMINAL HYSTERECTOMY    . APPLICATION OF WOUND VAC Right 08/09/2017   Procedure: APPLICATION OF WOUND VAC;  Surgeon: Albertine Patricia, DPM;  Location: [facility] ORS;  Service: Podiatry;  Laterality: Right;  . CARDIAC CATHETERIZATION  05/24/2010   nonobstructive CAD  . CATARACT EXTRACTION W/ INTRAOCULAR LENS IMPLANT Right 06/12/2016   Dr. Darleen Crocker  . CATARACT EXTRACTION W/ INTRAOCULAR LENS IMPLANT Left 06/26/2016   Dr. Darleen Crocker  . CESAREAN SECTION    . CHOLECYSTECTOMY    . COLONOSCOPY  06/12/2011   Procedure: COLONOSCOPY;  Surgeon: Juanita Craver, MD;  Location: [facility];  Service: Endoscopy;  Laterality: N/A;  . COLONOSCOPY N/A 03/17/2013   Procedure: COLONOSCOPY;  Surgeon: Juanita Craver, MD;  Location: [facility];  Service: Endoscopy;  Laterality: N/A;  . EYE SURGERY    . FOOT ARTHRODESIS Right 07/13/2017   Procedure: ARTHRODESIS  FOOT-MULTI.FUSIONS (6 JOINTS);  Surgeon: Albertine Patricia, DPM;  Location: [facility];  Service: Podiatry;  Laterality: Right;  . IRRIGATION AND DEBRIDEMENT FOOT Right 08/09/2017   Procedure: IRRIGATION AND DEBRIDEMENT FOOT;  Surgeon: Albertine Patricia, DPM;  Location: [facility];  Service: Podiatry;  Laterality: Right;  . KNEE ARTHROSCOPY     bilateral  . SINUSOTOMY    . TOOTH EXTRACTION  12/2016  . VAGINAL HYSTERECTOMY       Home Meds: Prior to Admission medications   Medication Sig Start Date End Date Taking? Authorizing Provider  amLODipine (NORVASC) 5 MG tablet TAKE 1 TABLET BY MOUTH EVERY DAY IN THE EVENING 09/04/19  Yes Venia Carbon, MD  albuterol (PROVENTIL) (2.5 MG/3ML) 0.083% nebulizer solution Take 3 mLs (2.5 mg total) by nebulization every 6 (six) hours as needed for wheezing or shortness of breath. Patient  not taking: Reported on 05/29/2020 01/08/20   Venia Carbon, MD  apixaban (ELIQUIS) 5 MG TABS tablet Take 1 tablet (5 mg total) by mouth 2 (two) times daily. 05/07/20   Minna Merritts, MD  atorvastatin (LIPITOR) 20 MG tablet TAKE 1 TABLET BY MOUTH DAILY AT 6 PM 01/25/20   Venia Carbon, MD  Certolizumab Pegol [facility]) Inject into the skin every 30 (thirty) days.    [provider]  Crisaborole (EUCRISA) 2 % OINT Apply to aa's BID PRN flares. 02/10/20   Brendolyn Patty, MD  fluconazole (DIFLUCAN) 100 MG tablet Take 1 tablet (100 mg total) by mouth daily. Repeat every few weeks as needed 01/14/20   Venia Carbon, MD  gabapentin (NEURONTIN) 300 MG capsule Take 1 capsule (300 mg total) by mouth 4 (four) times daily. 01/20/20   Venia Carbon, MD  ketoconazole (NIZORAL) 2 % cream Apply 1 application topically 2 (two) times daily. 12/30/19   [provider]  leflunomide (ARAVA) 20 MG tablet Take 20 mg by mouth daily.    [provider]  meclizine (ANTIVERT) 25 MG tablet TAKE 1 TABLET(25 MG) BY MOUTH THREE TIMES DAILY AS NEEDED FOR DIZZINESS  10/20/19   Venia Carbon, MD  metolazone (ZAROXOLYN) 2.5 MG tablet Take 1 tablet (2.5 mg total) by mouth daily as needed. 05/24/20   Minna Merritts, MD  metoprolol succinate (TOPROL-XL) 50 MG 24 hr tablet TAKE 1 TABLET BY MOUTH EVERY DAY WITH OR IMMEDIATELY FOLLOWING A MEAL 01/25/20   Viviana Simpler I, MD  montelukast (SINGULAIR) 10 MG tablet TAKE 1 TABLET BY MOUTH AT BEDTIME 02/02/20   Venia Carbon, MD  mupirocin ointment (BACTROBAN) 2 % Apply 1 application topically 2 (two) times daily. Qd to burn wounds 03/22/20   Brendolyn Patty, MD  nystatin (NYSTATIN) powder Apply 1 application topically 3 (three) times daily as needed.    [provider]  Oxycodone HCl 20 MG TABS Take 1 tablet (20 mg total) by mouth in the morning and at bedtime. Substitute for prev rx 05/27/20   Venia Carbon, MD  polyethylene glycol (MIRALAX / GLYCOLAX) packet Take 17 g by mouth daily as needed for mild constipation. 07/16/17   Henreitta Leber, MD  potassium chloride SA (KLOR-CON) 20 MEQ tablet Take 0.5 tablets (10 mEq total) by mouth 2 (two) times daily. 04/30/20   Minna Merritts, MD  predniSONE (DELTASONE) 5 MG tablet Take 3-4 tablets (15-20 mg total) by mouth daily with breakfast. Wean as directed 03/24/20   Venia Carbon, MD  SF 5000 PLUS 1.1 % CREA dental cream Place 1 application onto teeth 2 (two) times daily. 06/01/17   [provider]  spironolactone (ALDACTONE) 25 MG tablet TAKE 1 TABLET(25 MG) BY MOUTH DAILY 05/17/20   Viviana Simpler I, MD  terazosin (HYTRIN) 10 MG capsule TAKE 1 CAPSULE(10 MG) BY MOUTH AT BEDTIME 01/03/20   Viviana Simpler I, MD  torsemide (DEMADEX) 20 MG tablet Take 40 mg by mouth daily. Takes an extra tablet when feeling SOB or has noticeable edema    [provider]    Inpatient Medications: Scheduled Meds: . amiodarone  400 mg Oral BID  . amLODipine  5 mg Oral QPM  . apixaban  5 mg Oral BID  . atorvastatin  20 mg Oral QHS  . furosemide  80 mg  Intravenous BID  . gabapentin  300 mg Oral QID  . leflunomide  20 mg Oral Daily  .  metoprolol succinate  50 mg Oral Daily  . montelukast  10 mg Oral QHS  . oxyCODONE  20 mg Oral BID  . potassium chloride SA  10 mEq Oral BID  . predniSONE  5 mg Oral Q breakfast  . silver sulfADIAZINE   Topical BID  . sodium chloride flush  3 mL Intravenous Q12H  . spironolactone  25 mg Oral Daily  . terazosin  10 mg Oral QHS   Continuous Infusions: . sodium chloride    . amiodarone 60 mg/hr (05/30/20 1038)  . amiodarone     PRN Meds: sodium chloride, acetaminophen, albuterol, nitroGLYCERIN, ondansetron (ZOFRAN) IV, sodium chloride flush  Allergies:   Allergies  Allergen Reactions  . Fluoxetine     Headache, shaking, sleep issues  . Codeine     Nausea and vomiting/only when taking too much    Social History:   Social History   Socioeconomic History  . Marital status: Married    Spouse name: Not on file  . Number of children: 1  . Years of education: Not on file  . Highest education level: Not on file  Occupational History  . 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 Smoking status: Never Smoker  . Smokeless tobacco: Never Used  Vaping Use  . Vaping Use: Never used  Substance and Sexual Activity  . Alcohol use: No  . Drug use: No  . Sexual activity: Not Currently  Other Topics Concern  . Not on file  Social History Narrative   No living [place]   Husband, then daughter should be decision maker   Would accept resuscitation attempts   Not sure about tube feeds   Social Determinants of Health   Financial Resource Strain: Not on file  Food Insecurity: No Food Insecurity  . Worried About Charity fundraiser in the Last Year: Never true  . Ran Out of Food in the Last Year: Never true  Transportation Needs: No Transportation Needs  . Lack of Transportation (Medical): No  . Lack of Transportation (Non-Medical): No  Physical Activity:  Not on file  Stress: Not on file  Social Connections: Not on file  Intimate Partner Violence: Not on file     Family History:   Family History  Problem Relation Age of Onset  . Emphysema Father        smoked  . Tuberculosis Mother   . Parkinsonism Mother   . Diabetes type II Sister   . Breast cancer Sister   . Breast cancer Maternal Aunt   . Tuberculosis Sister     ROS:  Review of Systems  Constitutional: Positive for malaise/fatigue. Negative for chills, diaphoresis, fever and weight loss.  HENT: Negative for congestion.   Eyes: Negative for discharge and redness.  Respiratory: Positive for shortness of breath. Negative for cough, sputum production and wheezing.   Cardiovascular: Positive for chest pain and palpitations. Negative for orthopnea, claudication, leg swelling and PND.  Gastrointestinal: Negative for abdominal pain, heartburn, nausea and vomiting.  Musculoskeletal: Positive for joint pain. Negative for falls and myalgias.  Skin: Negative for rash.  Neurological: Positive for weakness. Negative for dizziness, tingling, tremors, sensory change, speech change, focal weakness and loss of consciousness.  Endo/Heme/Allergies: Does not bruise/bleed easily.  Psychiatric/Behavioral: Negative for substance abuse. The patient is not nervous/anxious.   All other systems reviewed and are negative.     Physical Exam/Data:   Vitals:   05/30/20  0700 05/30/20 0705 05/30/20 0741 05/30/20 1057  BP: (!) 165/121 104/65 117/72 (!) 122/50  Pulse: (!) 40 73 69 65  Resp: 17 14 (!) 25 17  Temp:   97.9 F (36.6 C) 98.2 F (36.8 C)  TempSrc:   Oral Oral  SpO2:   95% 98%  Weight: (!) 143.8 kg     Height:        Intake/Output Summary (Last 24 hours) at 05/30/2020 1111 Last data filed at 05/30/2020 1049 Gross per 24 hour  Intake 1176.98 ml  Output 3700 ml  Net -2523.02 ml   Filed Weights   05/29/20 1044 05/30/20 0411 05/30/20 0700  Weight: (!) 143.2 kg (!) 144.6 kg (!) 143.8 kg    Body mass index is 56.15 kg/m.   Physical Exam: General: Well developed, well nourished, in no acute distress. Obese.  Head: Normocephalic, atraumatic, sclera non-icteric, no xanthomas, nares without discharge.  Neck: Negative for carotid bruits. JVD difficult to assess secondary to body habitus. Lungs: Distant breath sounds without wheezing, crackles, or rhonchi. Breathing is unlabored. Heart: RRR with S1 S2. No murmurs, rubs, or gallops appreciated. Abdomen: Soft, non-tender, non-distended with normoactive bowel sounds. No hepatomegaly. No rebound/guarding. No obvious abdominal masses. Msk:  Strength and tone appear normal for age. Extremities: No clubbing or cyanosis. Trace bilateral lower extremity edema. Distal pedal pulses are 2+ and equal bilaterally. Neuro: Alert and oriented X 3. No facial asymmetry. No focal deficit. Moves all extremities spontaneously. Psych:  Responds to questions appropriately with a normal affect.   EKG:  The EKG was personally reviewed and demonstrates: 4/1 -A. fib with RVR, 155 bpm, rare PVC, nonspecific ST-T change. 4/3 - not in Epic for review Telemetry:  Telemetry was personally reviewed and demonstrates: SR with redevelopment of Afib with RVR at ~ 6 AM this morning persisting until ~ 8:30 AM with conversion to sinus rhythm without significant post termination pause. Currently in sinus rhythm  Weights: Filed Weights   05/29/20 1044 05/30/20 0411 05/30/20 0700  Weight: (!) 143.2 kg (!) 144.6 kg (!) 143.8 kg    Relevant CV Studies:  2D echo 05/29/2020: Pending __________  2D echo 12/2019: 1. Left ventricular ejection fraction, by estimation, is 60 to 65%. The  left ventricle has normal function. The left ventricle has no regional  wall motion abnormalities. There is mild left ventricular hypertrophy.  Left ventricular diastolic parameters  were normal.  2. Right ventricular systolic function is normal. The right ventricular  size is normal.   3. The mitral valve is grossly normal. Trivial mitral valve  regurgitation.  4. The aortic valve was not well visualized. Aortic valve regurgitation  is trivial.  __________  2D echo 03/2019: 1. Left ventricular ejection fraction, by visual estimation, is 55 to  60%. The left ventricle has normal function. There is mildly increased  left ventricular hypertrophy.  2. Definity contrast agent was given IV to delineate the left ventricular  endocardial borders.  3. Left ventricular diastolic parameters are consistent with Grade I  diastolic dysfunction (impaired relaxation).  4. The left ventricle has no regional wall motion abnormalities.  5. Global right ventricle has normal systolic function.The right  ventricular size is normal. No increase in right ventricular wall  thickness.  6. Left atrial size was mild-moderately dilated.  7. Right atrial size was normal.  8. Moderate mitral annular calcification.  9. The mitral valve is normal in structure. No evidence of mitral valve  regurgitation. No evidence of mitral stenosis.  10. The tricuspid valve is normal in structure. Tricuspid valve  regurgitation is not demonstrated.  11. The aortic valve is normal in structure. Aortic valve regurgitation is  not visualized. Mild to moderate aortic valve sclerosis/calcification  without any evidence of aortic stenosis.  12. The pulmonic valve was normal in structure. Pulmonic valve  regurgitation is trivial.  13. TR signal is inadequate for assessing pulmonary artery systolic  pressure.  __________  Carlton Adam MPI 08/2014:  There was no ST segment deviation noted during stress.  Defect 1: There is a medium defect of moderate severity present in the mid anterior, apical anterior and apex location. This is likely due to breast attenuation.  The study is normal.  This is a low risk study.  The left ventricular ejection fraction is normal (55-65%).  overall, the study is  suboptimal. Correlate clinically.  __________  2D echo 07/2014: - Left ventricle: The cavity size was normal. There was moderate  concentric hypertrophy. Systolic function was normal. The  estimated ejection fraction was in the range of 55% to 60%. Wall  motion was normal; there were no regional wall motion  abnormalities. Features are consistent with a pseudonormal left  ventricular filling pattern, with concomitant abnormal relaxation  and increased filling pressure (grade 2 diastolic dysfunction).  - Mitral valve: Calcified annulus. There was mild regurgitation.  - Left atrium: The atrium was moderately dilated.  - Pulmonary arteries: Systolic pressure was mildly increased. PA  peak pressure: 38 mm Hg (S).    Laboratory Data:  Chemistry Recent Labs  Lab 05/28/20 2031 05/29/20 0243 05/30/20 0349  NA 135 137 133*  K 3.4* 3.5 3.9  CL 94* 96* 92*  CO2 27 25 27   GLUCOSE 127* 129* 98  BUN 22 20 26*  CREATININE 1.10* 1.09* 1.13*  CALCIUM 10.1 10.1 9.7  GFRNONAA 54* 55* 52*  ANIONGAP 14 16* 14    Recent Labs  Lab 05/28/20 2031  PROT 6.9  ALBUMIN 3.9  AST 36  ALT 23  ALKPHOS 103  BILITOT 1.6*   Hematology Recent Labs  Lab 05/28/20 2031  WBC 9.6  RBC 4.70  HGB 13.6  HCT 42.7  MCV 90.9  MCH 28.9  MCHC 31.9  RDW 15.4  PLT 253   Cardiac EnzymesNo results for input(s): TROPONINI in the last 168 hours. No results for input(s): TROPIPOC in the last 168 hours.  BNP Recent Labs  Lab 05/28/20 2031  BNP 57.6    DDimer No results for input(s): DDIMER in the last 168 hours.  Radiology/Studies:  DG Chest Portable 1 View  Result Date: 05/28/2020 IMPRESSION: Cardiomegaly mild interstitial pulmonary edema. Electronically Signed   By: Ulyses Jarred M.D.   On: 05/28/2020 20:50    Assessment and Plan:   1.  Acute hypoxic respiratory failure secondary to acute on chronic HFpEF: -Respiratory status improved, though not yet back to baseline, feels much  better with supplemental O2 via nasal cannula -Documented UOP of 4.5 L for the admission, though she indicates 6-7 L total -Current weight of 317 pounds which is improved from a weight of 334 pounds at her most recent visit in our office on 3/28, with suspected dry weight approximately 310 pounds -Renal function is overall relatively stable when looking at her trend, monitor with diuresis, may be able to transition back to torsemide 4/4 -IV Lasix 80 mg bid with KCL repletion as indicated  -Spironolactone  -Recommend standing weights if able -Strict I/O   2.  PAF with RVR: -  Currently in sinus rhythm -Tachycardic rates exacerbating underlying diastolic CHF -Following transition of IV amiodarone to oral on 4/2, she redeveloped Afib with RVR -Continue IV amiodarone load for today with transition back to oral amiodarone, if maintaining sinus rhythm on 4/4 -If added rate control is needed, could transition amlodipine to diltiazem -Continue Toprol-XL -CHA2DS2-VASc at least 6 (CHF, HTN, age x1, DM, vascular disease, sex category) -Eliquis -Update TSH, free T4 normal in 07/2019 -Potassium improved with a current value of 3.9, currently on spironolactone with low-dose KCl ordered  3.  Elevated troponin/chest pain: -Minimally elevated peaking at 37 and flat trending, not consistent with ACS -Echo pending -Once her ventricular rates are improved recommend Lexiscan MPI to evaluate for high risk ischemia, potentially on 4/4 if rate and rhythm allow -Shatara Stanek make n.p.o. at midnight -On Eliquis in place of aspirin  4. HTN: -Blood pressure initially elevated which was likely contributing to her diastolic CHF though is currently well controlled -Continue current medical therapy as outlined above -Low-sodium diet recommended  5. HLD: -Lipitor  6. Morbid obesity with suspected OSA and possible OHS: -Weight loss advised -No formal sleep study  -Recommend Itamar study in the outpatient setting, she is  agreeable   For questions or updates, please contact [place] Please consult [email or web address] for contact info under Cardiology/STEMI.   Signed, Christell Faith, PA-C [place] Pager: [phone] 05/30/2020, 11:11 AM   I have seen and examined this patient with Christell Faith.  Agree with above, note added to reflect my findings.  On exam, RRR, no murmurs. Presented to the hospital with weight gain and volume overload. Has thus far diuresed net 4.5L. On presentation to the hospital was noted to be in atrial fibrillation. Initially was placed on amiodarone which converted her to sinus rhythm but when switched to PO went back into AF. She continues to be volume overloaded requiring IV diuresis. Would continue IV amiodarone at least for today with potentially switching back to PO tomorrow. As volume status improves, atrial fibrillation control Sharonlee Nine also improve. She likely also has OSA and Dylann Gallier need a sleep study as an outpatient. If OSA is diagnosed and she is compliant with CPAP, Karcyn Menn make volume and AF control much easier.  Lakiah Dhingra M. Laural Eiland MD 05/30/2020 12:02 PM

## 2020-05-30 NOTE — Progress Notes (Signed)
PROGRESS NOTE  Tina Pacheco [id number]:[id number] DOB: [birth year] DOA: 05/28/2020 PCP: Venia Carbon, MD   LOS: 2 days   Brief Narrative / Interim history: 70 year old female with history of paroxysmal A. fib on Eliquis, diastolic CHF, COPD, nonobstructive CAD, RA, HTN, HLD, chronic pain, obesity comes into the hospital with sudden onset of chest pain shortness of breath.  Over the last week or more she has noticed that she is having more weight gain, saw by her cardiologist 4 days ago at that time given significant weight gain her torsemide dose was increased.  She was in sinus rhythm in the office.  She has noticed improvement in her weight but all of a sudden prior to admission experienced chest pain, shortness of breath, upper body heaviness and came to the ER.  In the ER she was found to be fluid overloaded, chest x-ray showed mild pulmonary edema and she was in A. fib with RVR.  She did not respond to diltiazem and was placed on amiodarone infusion.  She was given IV Lasix and admitted to the hospital.  Subjective / 24h Interval events: Feels better this morning, she denies any chest pain, breathing feels fine at rest.  She has not been up.  Assessment & Plan: Principal Problem Acute hypoxic respiratory failure due to acute pulmonary edema in the setting of acute on chronic diastolic heart failure -she is compliant with all her medications however it seems that at times she is struggling with her diet and also with her fluid intake.  Suspect this may be part of the problem -She has been started on IV Lasix, she is responding well, continue.  She is net -4.2 L this morning -Repeat 2D echo is pending  Active Problems Paroxysmal A. fib, now with RVR -Patient has been started on amiodarone infusion given the fact that she did not respond to IV diltiazem.  She is chronically anticoagulated with Eliquis.  She initially responded well to intravenous amiodarone however after she was converted to  oral amiodarone redeveloped A. fib with RVR with rates into the 130s-140s along with chest pain.  Cardiology formally consulted today, appreciate input.  Elevated troponin -Overall flat, not in a pattern consistent with ACS, she has intermittent chest pains likely related to RVR.  2D echo pending this morning  COPD -No wheezing, this appears stable  Essential hypertension -Continue home medications  Hyperlipidemia-continue atorvastatin  Rheumatoid arthritis, chronic pain-continue home medications  Scheduled Meds: . amiodarone  400 mg Oral BID  . amLODipine  5 mg Oral QPM  . apixaban  5 mg Oral BID  . atorvastatin  20 mg Oral QHS  . furosemide  80 mg Intravenous BID  . gabapentin  300 mg Oral QID  . leflunomide  20 mg Oral Daily  . metoprolol succinate  50 mg Oral Daily  . montelukast  10 mg Oral QHS  . oxyCODONE  20 mg Oral BID  . potassium chloride SA  10 mEq Oral BID  . silver sulfADIAZINE   Topical BID  . sodium chloride flush  3 mL Intravenous Q12H  . spironolactone  25 mg Oral Daily  . terazosin  10 mg Oral QHS   Continuous Infusions: . sodium chloride    . amiodarone 60 mg/hr (05/30/20 0657)  . amiodarone     PRN Meds:.sodium chloride, acetaminophen, albuterol, nitroGLYCERIN, ondansetron (ZOFRAN) IV, sodium chloride flush  Diet Orders (From admission, onward)    Start     Ordered   05/29/20 0032  Diet Heart Room service appropriate? Yes; Fluid consistency: Thin  Diet effective now       Question Answer Comment  Room service appropriate? Yes   Fluid consistency: Thin      05/29/20 0031          DVT prophylaxis:  apixaban (ELIQUIS) tablet 5 mg     Code Status: Full Code  Family Communication: no family present   Status is: Inpatient  Remains inpatient appropriate because:Inpatient level of care appropriate due to severity of illness   Dispo: The patient is from: Home              Anticipated d/c is to: Home              Patient currently is not  medically stable to d/c.   Difficult to place patient No  Level of care: Progressive Cardiac  Consultants:  Cardiology  Procedures:  2D echo: pending  Microbiology  none  Antimicrobials: none    Objective: Vitals:   05/30/20 0655 05/30/20 0700 05/30/20 0705 05/30/20 0741  BP:  (!) 165/121 104/65 117/72  Pulse: 79 (!) 40 73 69  Resp: 16 17 14  (!) 25  Temp:    97.9 F (36.6 C)  TempSrc:    Oral  SpO2:    95%  Weight:  (!) 143.8 kg    Height:        Intake/Output Summary (Last 24 hours) at 05/30/2020 1022 Last data filed at 05/30/2020 0900 Gross per 24 hour  Intake 1210.7 ml  Output 3700 ml  Net -2489.3 ml   Filed Weights   05/29/20 1044 05/30/20 0411 05/30/20 0700  Weight: (!) 143.2 kg (!) 144.6 kg (!) 143.8 kg    Examination:  Constitutional: No significant distress Eyes: No scleral icterus ENMT: Moist mucous membranes Neck: normal, supple Respiratory: Distant breath sounds due to body habitus but no wheezing or crackles heard Cardiovascular: Irregularly irregular, tachycardic, trace edema Abdomen: Soft, NT, ND, bowel sounds positive Musculoskeletal: no clubbing / cyanosis.  Skin: No rashes seen Neurologic: Nonfocal, equal strength   Data Reviewed: I have independently reviewed following labs and imaging studies   CBC: Recent Labs  Lab 05/28/20 2031  WBC 9.6  NEUTROABS 5.4  HGB 13.6  HCT 42.7  MCV 90.9  PLT 962   Basic Metabolic Panel: Recent Labs  Lab 05/28/20 2031 05/29/20 0243 05/30/20 0349  NA 135 137 133*  K 3.4* 3.5 3.9  CL 94* 96* 92*  CO2 27 25 27   GLUCOSE 127* 129* 98  BUN 22 20 26*  CREATININE 1.10* 1.09* 1.13*  CALCIUM 10.1 10.1 9.7  MG 1.7  --   --    Liver Function Tests: Recent Labs  Lab 05/28/20 2031  AST 36  ALT 23  ALKPHOS 103  BILITOT 1.6*  PROT 6.9  ALBUMIN 3.9   Coagulation Profile: Recent Labs  Lab 05/28/20 2031  INR 1.2   HbA1C: No results for input(s): HGBA1C in the last 72 hours. CBG: No  results for input(s): GLUCAP in the last 168 hours.  Recent Results (from the past 240 hour(s))  SARS CORONAVIRUS 2 (TAT 6-24 HRS) Nasopharyngeal Nasopharyngeal Swab     Status: None   Collection Time: 05/28/20 11:06 PM   Specimen: Nasopharyngeal Swab  Result Value Ref Range Status   SARS Coronavirus 2 NEGATIVE NEGATIVE Final    Comment: (NOTE) SARS-CoV-2 target nucleic acids are NOT DETECTED.  The SARS-CoV-2 RNA is generally detectable in upper and lower respiratory  specimens during the acute phase of infection. Negative results do not preclude SARS-CoV-2 infection, do not rule out co-infections with other pathogens, and should not be used as the sole basis for treatment or other patient management decisions. Negative results must be combined with clinical observations, patient history, and epidemiological information. The expected result is Negative.  Fact Sheet for Patients: SugarRoll.be  Fact Sheet for Healthcare Providers: https://www.woods-mathews.com/  This test is not yet approved or cleared by the [place] FDA and  has been authorized for detection and/or diagnosis of SARS-CoV-2 by FDA under an Emergency Use Authorization (EUA). This EUA will remain  in effect (meaning this test can be used) for the duration of the COVID-19 declaration under Se ction 564(b)(1) of the Act, 21 U.S.C. section 360bbb-3(b)(1), unless the authorization is terminated or revoked sooner.  Performed at [facility], [place] [address] [place], [place] [address]      Radiology Studies: No results found. Marzetta Board, MD, PhD [facility]  Between 7 am - 7 pm I am available, please contact me via Amion (for emergencies) or Securechat (non urgent messages)  Between 7 pm - 7 am I am not available, please contact night coverage MD/APP via Amion

## 2020-05-31 ENCOUNTER — Inpatient Hospital Stay (HOSPITAL_COMMUNITY): Payer: Medicare Other

## 2020-05-31 ENCOUNTER — Telehealth: Payer: Self-pay | Admitting: Cardiovascular Disease

## 2020-05-31 DIAGNOSIS — J449 Chronic obstructive pulmonary disease, unspecified: Secondary | ICD-10-CM | POA: Diagnosis not present

## 2020-05-31 DIAGNOSIS — E782 Mixed hyperlipidemia: Secondary | ICD-10-CM | POA: Diagnosis not present

## 2020-05-31 DIAGNOSIS — R079 Chest pain, unspecified: Secondary | ICD-10-CM

## 2020-05-31 DIAGNOSIS — I4891 Unspecified atrial fibrillation: Secondary | ICD-10-CM | POA: Diagnosis not present

## 2020-05-31 DIAGNOSIS — I509 Heart failure, unspecified: Secondary | ICD-10-CM | POA: Diagnosis not present

## 2020-05-31 DIAGNOSIS — I248 Other forms of acute ischemic heart disease: Secondary | ICD-10-CM

## 2020-05-31 LAB — CBC
HCT: 43.3 % (ref 36.0–46.0)
Hemoglobin: 13.8 g/dL (ref 12.0–15.0)
MCH: 29 pg (ref 26.0–34.0)
MCHC: 31.9 g/dL (ref 30.0–36.0)
MCV: 91 fL (ref 80.0–100.0)
Platelets: 214 10*3/uL (ref 150–400)
RBC: 4.76 MIL/uL (ref 3.87–5.11)
RDW: 14.9 % (ref 11.5–15.5)
WBC: 6.2 10*3/uL (ref 4.0–10.5)
nRBC: 0 % (ref 0.0–0.2)

## 2020-05-31 LAB — COMPREHENSIVE METABOLIC PANEL
ALT: 18 U/L (ref 0–44)
AST: 24 U/L (ref 15–41)
Albumin: 3.5 g/dL (ref 3.5–5.0)
Alkaline Phosphatase: 87 U/L (ref 38–126)
Anion gap: 10 (ref 5–15)
BUN: 29 mg/dL — ABNORMAL HIGH (ref 8–23)
CO2: 32 mmol/L (ref 22–32)
Calcium: 9.3 mg/dL (ref 8.9–10.3)
Chloride: 89 mmol/L — ABNORMAL LOW (ref 98–111)
Creatinine, Ser: 1.3 mg/dL — ABNORMAL HIGH (ref 0.44–1.00)
GFR, Estimated: 44 mL/min — ABNORMAL LOW (ref >60)
Glucose, Bld: 112 mg/dL — ABNORMAL HIGH (ref 70–99)
Potassium: 2.6 mmol/L — CL (ref 3.5–5.1)
Sodium: 131 mmol/L — ABNORMAL LOW (ref 135–145)
Total Bilirubin: 1.1 mg/dL (ref 0.3–1.2)
Total Protein: 6.6 g/dL (ref 6.5–8.1)

## 2020-05-31 LAB — POTASSIUM: Potassium: 3.3 mmol/L — ABNORMAL LOW (ref 3.5–5.1)

## 2020-05-31 MED ORDER — TORSEMIDE 20 MG PO TABS
40.0000 mg | ORAL_TABLET | Freq: Every day | ORAL | Status: DC
  Administered 2020-05-31 – 2020-06-03 (×4): 40 mg via ORAL
  Filled 2020-05-31 (×4): qty 2

## 2020-05-31 MED ORDER — POTASSIUM CHLORIDE CRYS ER 20 MEQ PO TBCR
40.0000 meq | EXTENDED_RELEASE_TABLET | Freq: Once | ORAL | Status: AC
  Administered 2020-05-31: 40 meq via ORAL
  Filled 2020-05-31: qty 2

## 2020-05-31 MED ORDER — POTASSIUM CHLORIDE CRYS ER 20 MEQ PO TBCR
40.0000 meq | EXTENDED_RELEASE_TABLET | ORAL | Status: AC
  Administered 2020-05-31 (×3): 40 meq via ORAL
  Filled 2020-05-31 (×2): qty 2
  Filled 2020-05-31: qty 4

## 2020-05-31 NOTE — Telephone Encounter (Signed)
-----   Message from Rise Mu, PA-C sent at 05/29/2020  9:50 AM EDT ----- Please schedule hospital follow up in 7-10 days. Thanks!

## 2020-05-31 NOTE — Progress Notes (Signed)
PROGRESS NOTE  Tina Pacheco [id number]:[id number] DOB: Oct 26, 1950 DOA: 05/28/2020 PCP: Venia Carbon, MD   LOS: 3 days   Brief Narrative / Interim history: 70 year old female with history of paroxysmal A. fib on Eliquis, diastolic CHF, COPD, nonobstructive CAD, RA, HTN, HLD, chronic pain, obesity comes into the hospital with sudden onset of chest pain shortness of breath.  Over the last week or more she has noticed that she is having more weight gain, saw by her cardiologist 4 days ago at that time given significant weight gain her torsemide dose was increased.  She was in sinus rhythm in the office.  She has noticed improvement in her weight but all of a sudden prior to admission experienced chest pain, shortness of breath, upper body heaviness and came to the ER.  In the ER she was found to be fluid overloaded, chest x-ray showed mild pulmonary edema and she was in A. fib with RVR.  She did not respond to diltiazem and was placed on amiodarone infusion.  She was given IV Lasix and admitted to the hospital.  Subjective / 24h Interval events: No chest pain, no shortness of breath. No nausea/vomiting. Denies palpitations  Assessment & Plan: Principal Problem Acute hypoxic respiratory failure due to acute pulmonary edema in the setting of acute on chronic diastolic heart failure -she is compliant with all her medications however it seems that at times she is struggling with her diet and also with her fluid intake.  Suspect this may be part of the problem -She has been started on IV Lasix, she is responding well, however developed mild AKI this morning. Net negative 5.7 L. Hold lasix, appreciate cards input -echo yesterday showed normal EF, grade 1 DD  Active Problems Paroxysmal A. fib, now with RVR -Patient has been started on amiodarone infusion given the fact that she did not respond to IV diltiazem.  She is chronically anticoagulated with Eliquis.  She initially responded well to intravenous  amiodarone however after she was converted to oral amiodarone redeveloped A. fib with RVR with rates into the 130s-140s along with chest pain.  Back on amiodaron infusion. Cards seeing.  Hypokalemia - due to lasix, hold Lasix, replete potassium  Creatinine elevation - 1.3 this morning, from 1.1 on admission. Due to Lasix, monitor  Elevated troponin -Overall flat, not in a pattern consistent with ACS, she has intermittent chest pains likely related to RVR.   -cardiology plans a myoview  COPD -No wheezing, this appears stable  Essential hypertension -Continue home medications  Hyperlipidemia-continue atorvastatin  Rheumatoid arthritis, chronic pain-continue home medications  Scheduled Meds: . amiodarone  400 mg Oral BID  . amLODipine  5 mg Oral QPM  . apixaban  5 mg Oral BID  . atorvastatin  20 mg Oral QHS  . gabapentin  300 mg Oral QID  . leflunomide  20 mg Oral Daily  . metoprolol succinate  50 mg Oral Daily  . montelukast  10 mg Oral QHS  . oxyCODONE  20 mg Oral BID  . potassium chloride  40 mEq Oral Q3H  . predniSONE  5 mg Oral Q breakfast  . silver sulfADIAZINE   Topical BID  . sodium chloride flush  3 mL Intravenous Q12H  . spironolactone  25 mg Oral Daily  . terazosin  10 mg Oral QHS   Continuous Infusions: . sodium chloride    . amiodarone 30 mg/hr (05/30/20 1602)   PRN Meds:.sodium chloride, acetaminophen, albuterol, nitroGLYCERIN, ondansetron (ZOFRAN) IV, sodium chloride flush  Diet Orders (From admission, onward)    Start     Ordered   05/31/20 0001  Diet NPO time specified Except for: Sips with Meds  Diet effective midnight       Question:  Except for  Answer:  Sips with Meds   05/30/20 1111          DVT prophylaxis:  apixaban (ELIQUIS) tablet 5 mg     Code Status: Full Code  Family Communication: no family present   Status is: Inpatient  Remains inpatient appropriate because:Inpatient level of care appropriate due to severity of  illness   Dispo: The patient is from: Home              Anticipated d/c is to: Home              Patient currently is not medically stable to d/c.   Difficult to place patient No  Level of care: Progressive Cardiac  Consultants:  Cardiology  Procedures:  2D echo  Microbiology  none  Antimicrobials: none    Objective: Vitals:   05/30/20 2000 05/30/20 2200 05/30/20 2206 05/30/20 2208  BP:   (!) 155/77   Pulse: 60 (!) 59 63 61  Resp: 15 (!) 8 (!) 21 13  Temp:    98.7 F (37.1 C)  TempSrc:    Oral  SpO2: 98% 95% 95% 96%  Weight:      Height:        Intake/Output Summary (Last 24 hours) at 05/31/2020 0855 Last data filed at 05/30/2020 2100 Gross per 24 hour  Intake 1052.27 ml  Output 2100 ml  Net -1047.73 ml   Filed Weights   05/29/20 1044 05/30/20 0411 05/30/20 0700  Weight: (!) 143.2 kg (!) 144.6 kg (!) 143.8 kg    Examination:  Constitutional: NAD in bed Eyes: no icterus  ENMT: mmm Neck: normal, supple Respiratory: no wheezing, no crackles Cardiovascular: regular, bradycardic, no edema Abdomen: soft, nt, nd, bs+ Musculoskeletal: no clubbing / cyanosis.  Skin: no rashes Neurologic: no focal deficits   Data Reviewed: I have independently reviewed following labs and imaging studies   CBC: Recent Labs  Lab 05/28/20 2031 05/31/20 0342  WBC 9.6 6.2  NEUTROABS 5.4  --   HGB 13.6 13.8  HCT 42.7 43.3  MCV 90.9 91.0  PLT 253 646   Basic Metabolic Panel: Recent Labs  Lab 05/28/20 2031 05/29/20 0243 05/30/20 0349 05/31/20 0342  NA 135 137 133* 131*  K 3.4* 3.5 3.9 2.6*  CL 94* 96* 92* 89*  CO2 27 25 27  32  GLUCOSE 803* 129* 98 112*  BUN 22 20 26* 29*  CREATININE 1.10* 1.09* 1.13* 1.30*  CALCIUM 10.1 10.1 9.7 9.3  MG 1.7  --   --   --    Liver Function Tests: Recent Labs  Lab 05/28/20 2031 05/31/20 0342  AST 36 24  ALT 23 18  ALKPHOS 103 87  BILITOT 1.6* 1.1  PROT 6.9 6.6  ALBUMIN 3.9 3.5   Coagulation Profile: Recent Labs  Lab  05/28/20 2031  INR 1.2   HbA1C: No results for input(s): HGBA1C in the last 72 hours. CBG: No results for input(s): GLUCAP in the last 168 hours.  Recent Results (from the past 240 hour(s))  SARS CORONAVIRUS 2 (TAT 6-24 HRS) Nasopharyngeal Nasopharyngeal Swab     Status: None   Collection Time: 05/28/20 11:06 PM   Specimen: Nasopharyngeal Swab  Result Value Ref Range Status   SARS Coronavirus 2  NEGATIVE NEGATIVE Final    Comment: (NOTE) SARS-CoV-2 target nucleic acids are NOT DETECTED.  The SARS-CoV-2 RNA is generally detectable in upper and lower respiratory specimens during the acute phase of infection. Negative results do not preclude SARS-CoV-2 infection, do not rule out co-infections with other pathogens, and should not be used as the sole basis for treatment or other patient management decisions. Negative results must be combined with clinical observations, patient history, and epidemiological information. The expected result is Negative.  Fact Sheet for Patients: SugarRoll.be  Fact Sheet for Healthcare Providers: https://www.woods-mathews.com/  This test is not yet approved or cleared by the [place] FDA and  has been authorized for detection and/or diagnosis of SARS-CoV-2 by FDA under an Emergency Use Authorization (EUA). This EUA will remain  in effect (meaning this test can be used) for the duration of the COVID-19 declaration under Se ction 564(b)(1) of the Act, 21 U.S.C. section 360bbb-3(b)(1), unless the authorization is terminated or revoked sooner.  Performed at [facility], [place] [address] [place], [place] [address]      Radiology Studies: No results found. Marzetta Board, MD, PhD [facility]  Between 7 am - 7 pm I am available, please contact me via Amion (for emergencies) or Securechat (non urgent messages)  Between 7 pm - 7 am I am not available, please contact night coverage MD/APP via  Amion

## 2020-05-31 NOTE — Care Management Important Message (Signed)
Important Message  Patient Details  Name: Tina Pacheco MRN: [id number] Date of Birth: 1950-04-27   Medicare Important Message Given:  Yes     Dannette Barbara 05/31/2020, 12:19 PM

## 2020-05-31 NOTE — Progress Notes (Signed)
05/31/2020 5:16 AM    Test: K+ Critical Value: 2.6  Name of Provider Notified: Damita Dunnings  Orders Received? Or Actions Taken?: Awaiting response

## 2020-05-31 NOTE — Progress Notes (Signed)
Progress Note  Patient Name: Tina Pacheco Date of Encounter: 05/31/2020  [facility] Cardiologist: Ida Rogue, MD   Subjective   Patient is NPO for stress test today. No further chest pain. Potassium low at 2.6. Creatinine mildly up from yesterday. Lasix held.   Inpatient Medications    Scheduled Meds: . amiodarone  400 mg Oral BID  . amLODipine  5 mg Oral QPM  . apixaban  5 mg Oral BID  . atorvastatin  20 mg Oral QHS  . gabapentin  300 mg Oral QID  . leflunomide  20 mg Oral Daily  . metoprolol succinate  50 mg Oral Daily  . montelukast  10 mg Oral QHS  . oxyCODONE  20 mg Oral BID  . potassium chloride  40 mEq Oral Q3H  . predniSONE  5 mg Oral Q breakfast  . silver sulfADIAZINE   Topical BID  . sodium chloride flush  3 mL Intravenous Q12H  . spironolactone  25 mg Oral Daily  . terazosin  10 mg Oral QHS   Continuous Infusions: . sodium chloride    . amiodarone 30 mg/hr (05/31/20 0919)   PRN Meds: sodium chloride, acetaminophen, albuterol, nitroGLYCERIN, ondansetron (ZOFRAN) IV, sodium chloride flush   Vital Signs    Vitals:   05/30/20 2000 05/30/20 2200 05/30/20 2206 05/30/20 2208  BP:   (!) 155/77   Pulse: 60 (!) 59 63 61  Resp: 15 (!) 8 (!) 21 13  Temp:    98.7 F (37.1 C)  TempSrc:    Oral  SpO2: 98% 95% 95% 96%  Weight:      Height:        Intake/Output Summary (Last 24 hours) at 05/31/2020 0936 Last data filed at 05/30/2020 2100 Gross per 24 hour  Intake 812.27 ml  Output 2100 ml  Net -1287.73 ml   Last 3 Weights 05/30/2020 05/30/2020 05/29/2020  Weight (lbs) 317 lb 318 lb 12.6 oz 315 lb 11.7 oz  Weight (kg) 143.79 kg 144.6 kg 143.216 kg      Telemetry    SB/SR HR 50-60 - Personally Reviewed  ECG    No new tracing - Personally Reviewed  Physical Exam   GEN: No acute distress.   Neck: + JVD Cardiac: RRR, no murmurs, rubs, or gallops.  Respiratory: mild Crackles at bases. GI: Soft, nontender, non-distended  MS: 1+ lower leg edema; No  deformity. Neuro:  Nonfocal  Psych: Normal affect   Labs    High Sensitivity Troponin:   Recent Labs  Lab 05/28/20 2031 05/28/20 2209 05/29/20 0040 05/29/20 0243 05/30/20 0754  TROPONINIHS 17 28* 35* 37* 37*      Chemistry Recent Labs  Lab 05/28/20 2031 05/29/20 0243 05/30/20 0349 05/31/20 0342  NA 135 137 133* 131*  K 3.4* 3.5 3.9 2.6*  CL 94* 96* 92* 89*  CO2 27 25 27  32  GLUCOSE 127* 129* 98 112*  BUN 22 20 26* 29*  CREATININE 1.10* 1.09* 1.13* 1.30*  CALCIUM 10.1 10.1 9.7 9.3  PROT 6.9  --   --  6.6  ALBUMIN 3.9  --   --  3.5  AST 36  --   --  24  ALT 23  --   --  18  ALKPHOS 103  --   --  87  BILITOT 1.6*  --   --  1.1  GFRNONAA 54* 55* 52* 44*  ANIONGAP 14 16* 14 10     Hematology Recent Labs  Lab 05/28/20  2031 05/31/20 0342  WBC 9.6 6.2  RBC 4.70 4.76  HGB 13.6 13.8  HCT 42.7 43.3  MCV 90.9 91.0  MCH 28.9 29.0  MCHC 31.9 31.9  RDW 15.4 14.9  PLT 253 214    BNP Recent Labs  Lab 05/28/20 2031  BNP 57.6     DDimer No results for input(s): DDIMER in the last 168 hours.   Radiology    ECHOCARDIOGRAM COMPLETE  Result Date: 05/30/2020    ECHOCARDIOGRAM REPORT   Patient Name:   Tina Pacheco Date of Exam: 05/29/2020 Medical Rec #:  [id number]       Height:       63.0 in Accession #:    [id number]      Weight:       315.7 lb Date of Birth:  [birth year]       BSA:          2.348 m Patient Age:    70 years        BP:           142/68 mmHg Patient Gender: F               HR:           79 bpm. Exam Location:  [facility] Procedure: 2D Echo, Cardiac Doppler and Color Doppler Indications:     CHF-Acute Diastolic O16.07  History:         Patient has prior history of Echocardiogram examinations. CHF,                  Arrythmias:Atrial Fibrillation; Risk Factors:Hypertension.  Sonographer:     Alyse Low Roar Referring Phys:  [id number] Clarnce Flock Diagnosing Phys: Ida Rogue MD IMPRESSIONS  1. Left ventricular ejection fraction, by estimation, is 60 to 65%. The  left ventricle has normal function. The left ventricle has no regional wall motion abnormalities. There is mild left ventricular hypertrophy. Left ventricular diastolic parameters are consistent with Grade I diastolic dysfunction (impaired relaxation).  2. Right ventricular systolic function is normal. The right ventricular size is normal. Tricuspid regurgitation signal is inadequate for assessing PA pressure.  3. Left atrial size was moderately dilated. FINDINGS  Left Ventricle: Left ventricular ejection fraction, by estimation, is 60 to 65%. The left ventricle has normal function. The left ventricle has no regional wall motion abnormalities. The left ventricular internal cavity size was normal in size. There is  mild left ventricular hypertrophy. Left ventricular diastolic parameters are consistent with Grade I diastolic dysfunction (impaired relaxation). Right Ventricle: The right ventricular size is normal. No increase in right ventricular wall thickness. Right ventricular systolic function is normal. Tricuspid regurgitation signal is inadequate for assessing PA pressure. Left Atrium: Left atrial size was moderately dilated. Right Atrium: Right atrial size was normal in size. Pericardium: There is no evidence of pericardial effusion. Mitral Valve: The mitral valve is normal in structure. Mild mitral annular calcification. No evidence of mitral valve regurgitation. No evidence of mitral valve stenosis. Tricuspid Valve: The tricuspid valve is normal in structure. Tricuspid valve regurgitation is not demonstrated. No evidence of tricuspid stenosis. Aortic Valve: The aortic valve is normal in structure. Aortic valve regurgitation is not visualized. Mild aortic valve sclerosis is present, with no evidence of aortic valve stenosis. Aortic valve peak gradient measures 8.0 mmHg. Pulmonic Valve: The pulmonic valve was normal in structure. Pulmonic valve regurgitation is not visualized. No evidence of pulmonic stenosis.  Aorta: The aortic root is normal in size and structure.  Venous: The inferior vena cava is normal in size with greater than 50% respiratory variability, suggesting right atrial pressure of 3 mmHg. IAS/Shunts: No atrial level shunt detected by color flow Doppler.  LEFT VENTRICLE PLAX 2D LVIDd:         4.42 cm  Diastology LVIDs:         3.01 cm  LV e' medial:    6.64 cm/s LV PW:         1.20 cm  LV E/e' medial:  10.9 LV IVS:        1.27 cm  LV e' lateral:   7.51 cm/s LVOT diam:     1.70 cm  LV E/e' lateral: 9.6 LVOT Area:     2.27 cm  RIGHT VENTRICLE RV Mid diam:    2.65 cm RV S prime:     16.30 cm/s TAPSE (M-mode): 1.8 cm LEFT ATRIUM             Index       RIGHT ATRIUM           Index LA diam:        4.00 cm 1.70 cm/m  RA Area:     11.90 cm LA Vol (A2C):   66.8 ml 28.45 ml/m RA Volume:   23.80 ml  10.14 ml/m LA Vol (A4C):   81.2 ml 34.59 ml/m LA Biplane Vol: 80.1 ml 34.12 ml/m  AORTIC VALVE                PULMONIC VALVE AV Area (Vmax): 2.01 cm    PV Vmax:        1.10 m/s AV Vmax:        141.00 cm/s PV Peak grad:   4.8 mmHg AV Peak Grad:   8.0 mmHg    RVOT Peak grad: 3 mmHg LVOT Vmax:      125.00 cm/s  AORTA Ao Root diam: 2.90 cm MITRAL VALVE MV Area (PHT): 2.50 cm     SHUNTS MV Decel Time: 304 msec     Systemic Diam: 1.70 cm MV E velocity: 72.30 cm/s MV A velocity: 108.00 cm/s MV E/A ratio:  0.67 MV A Prime:    6.5 cm/s Ida Rogue MD Electronically signed by Ida Rogue MD Signature Date/Time: 05/30/2020/11:43:21 AM    Final     Cardiac Studies   Echo 05/29/20 1. Left ventricular ejection fraction, by estimation, is 60 to 65%. The  left ventricle has normal function. The left ventricle has no regional  wall motion abnormalities. There is mild left ventricular hypertrophy.  Left ventricular diastolic parameters  are consistent with Grade I diastolic dysfunction (impaired relaxation).  2. Right ventricular systolic function is normal. The right ventricular  size is normal. Tricuspid  regurgitation signal is inadequate for assessing  PA pressure.  3. Left atrial size was moderately dilated.    Patient Profile     70 y.o. female with pmh of nonobstructive cAD, HFpEF, PAF on Eliquis, HTN, HLD, COPD, rheumatoid arthritis, falls, morbid obesity, OSA who is being seen fro [facility] R[facility].   Assessment & Plan    Acute hypoxic respiratory failure Acute on chronic HFpEF -Volume status is improving.  Suspect volume overload in the setting of A. fib RVR -IV Lasix 80 mg twice daily> held 4/4 for rising creatinine -PTA torsemide 40 mg daily -Urine output overnight -2.1 L, net -6.5 L -Continue spironolactone -Weight down to 317 pounds yesterday, todays weight pending.  Suspect a dry weight 310 pounds.  -  I think she still has some minor volume overload. I will restart her home Torsemide  PAF with RVR -Remains in sinus rhythm/SB -Transition IV amiodarone to oral amio -Continue Toprol -CHA2DS2-VASc at least 6.  Continue Eliquis  Chest pain/elevated troponin -Chest pain in the setting of A. fib RVR -Troponin minimally elevated -Echo ordered -Stress test today -No aspirin with Eliquis  Hypertension - amlodipine 5mg  daily, Toprol-XL 50 mg daily, spiro - pressures reasonable  Hyperlipidemia - Continue statin  Obesity Possible OSA -Commend outpatient sleep study  Hypokalemia - 2.6 today - supplementation ordered  For questions or updates, please contact [facility] Please consult [email or web address] for contact info under        Signed, Avarey Yaeger Ninfa Meeker, PA-C  05/31/2020, 9:36 AM

## 2020-05-31 NOTE — Telephone Encounter (Signed)
Patient currently admitted

## 2020-05-31 NOTE — Progress Notes (Signed)
PT Cancellation Note  Patient Details Name: Tina Pacheco MRN: [id number] DOB: January 03, 1951   Cancelled Treatment:    Reason Eval/Treat Not Completed: Other (comment). Chart reviewed, pt with low K+ at 2.6 this date and is currently NPO for imaging study with cardio. Not appropriate for exertional activity. Due to 3 attempts, will cancel current order. Please re-order once pt is medically stable and able to participate.   Jeane Cashatt 05/31/2020, 9:12 AM Greggory Stallion, PT, DPT [phone]

## 2020-06-01 ENCOUNTER — Other Ambulatory Visit: Payer: Self-pay | Admitting: Internal Medicine

## 2020-06-01 ENCOUNTER — Inpatient Hospital Stay: Payer: Medicare Other

## 2020-06-01 DIAGNOSIS — I4891 Unspecified atrial fibrillation: Secondary | ICD-10-CM | POA: Diagnosis not present

## 2020-06-01 DIAGNOSIS — I248 Other forms of acute ischemic heart disease: Secondary | ICD-10-CM

## 2020-06-01 LAB — CBC
HCT: 40.2 % (ref 36.0–46.0)
Hemoglobin: 13.1 g/dL (ref 12.0–15.0)
MCH: 29.7 pg (ref 26.0–34.0)
MCHC: 32.6 g/dL (ref 30.0–36.0)
MCV: 91.2 fL (ref 80.0–100.0)
Platelets: 224 10*3/uL (ref 150–400)
RBC: 4.41 MIL/uL (ref 3.87–5.11)
RDW: 14.6 % (ref 11.5–15.5)
WBC: 7.7 10*3/uL (ref 4.0–10.5)
nRBC: 0 % (ref 0.0–0.2)

## 2020-06-01 LAB — NM MYOCAR MULTI W/SPECT W/WALL MOTION / EF
LV dias vol: 63 mL (ref 46–106)
LV sys vol: 28 mL
MPHR: 150 {beats}/min
Peak HR: 75 {beats}/min
Percent HR: 50 %
Rest HR: 64 {beats}/min
TID: 0.76

## 2020-06-01 LAB — COMPREHENSIVE METABOLIC PANEL
ALT: 17 U/L (ref 0–44)
AST: 25 U/L (ref 15–41)
Albumin: 3.6 g/dL (ref 3.5–5.0)
Alkaline Phosphatase: 93 U/L (ref 38–126)
Anion gap: 9 (ref 5–15)
BUN: 32 mg/dL — ABNORMAL HIGH (ref 8–23)
CO2: 32 mmol/L (ref 22–32)
Calcium: 10.4 mg/dL — ABNORMAL HIGH (ref 8.9–10.3)
Chloride: 95 mmol/L — ABNORMAL LOW (ref 98–111)
Creatinine, Ser: 1.23 mg/dL — ABNORMAL HIGH (ref 0.44–1.00)
GFR, Estimated: 47 mL/min — ABNORMAL LOW (ref >60)
Glucose, Bld: 103 mg/dL — ABNORMAL HIGH (ref 70–99)
Potassium: 3.9 mmol/L (ref 3.5–5.1)
Sodium: 136 mmol/L (ref 135–145)
Total Bilirubin: 1.2 mg/dL (ref 0.3–1.2)
Total Protein: 6.7 g/dL (ref 6.5–8.1)

## 2020-06-01 LAB — HEPARIN LEVEL (UNFRACTIONATED): Heparin Unfractionated: >2.2 IU/mL — ABNORMAL HIGH (ref 0.30–0.70)

## 2020-06-01 MED ORDER — HEPARIN BOLUS VIA INFUSION
4000.0000 [IU] | Freq: Once | INTRAVENOUS | Status: AC
  Administered 2020-06-01: 4000 [IU] via INTRAVENOUS
  Filled 2020-06-01: qty 4000

## 2020-06-01 MED ORDER — HEPARIN (PORCINE) 25000 UT/250ML-% IV SOLN
1300.0000 [IU]/h | INTRAVENOUS | Status: DC
  Administered 2020-06-01: 1300 [IU]/h via INTRAVENOUS
  Filled 2020-06-01 (×2): qty 250

## 2020-06-01 MED ORDER — TECHNETIUM TC 99M TETROFOSMIN IV KIT
30.0000 | PACK | Freq: Once | INTRAVENOUS | Status: AC | PRN
  Administered 2020-06-01: 31.041 via INTRAVENOUS

## 2020-06-01 MED ORDER — ASPIRIN EC 81 MG PO TBEC
81.0000 mg | DELAYED_RELEASE_TABLET | Freq: Every day | ORAL | Status: DC
  Administered 2020-06-02 – 2020-06-03 (×2): 81 mg via ORAL
  Filled 2020-06-01 (×2): qty 1

## 2020-06-01 MED ORDER — AMLODIPINE BESYLATE 10 MG PO TABS
10.0000 mg | ORAL_TABLET | Freq: Every evening | ORAL | Status: DC
  Administered 2020-06-01 – 2020-06-07 (×7): 10 mg via ORAL
  Filled 2020-06-01 (×7): qty 1

## 2020-06-01 NOTE — TOC Transition Note (Signed)
Transition of Care [facility]) - CM/SW Discharge Note   Patient Details  Name: Tina Pacheco MRN: [id number] Date of Birth: 1950-04-29  [facility] of [facility]) CM/SW Contact:  Eileen Stanford, LCSW Phone Number: 06/01/2020, 1:20 PM   Clinical Narrative:   MD messaged CSW requesting that CSW set pt up with [place]. CSW spoke with pt and she is agreeable. Pt states she has used several agencies in the past and didn't have a prefence. CSW has spoke to [facility] agencies and [facility] service (RN, PT). No additional needs.    Final next level of care: Home w Home Health Services Barriers to Discharge: No Barriers Identified   Patient Goals and CMS Choice Patient states their goals for this hospitalization and ongoing recovery are:: to get home      Discharge Placement                    Patient and family notified of of transfer: 06/01/20  Discharge Plan and Services In-house Referral: Clinical Social Work   Post Acute Care Choice: NA                    HH Arranged: PT,RN [place] Agency: [place] ([facility]) Date HH Agency Contacted: 06/01/20 Time [place]: [phone] Representative spoke with at [place]: [place] ([place]) Interventions     Readmission Risk Interventions Readmission Risk Prevention Plan 06/01/2020  Transportation Screening Complete  PCP or Specialist Appt within 3-5 Days Complete  HRI or [place] Complete  Social Work Consult for [place] Planning/Counseling Complete  Palliative Care Screening Not Applicable  Medication Review [employer]) (No Data)  Some recent data might be hidden

## 2020-06-01 NOTE — Progress Notes (Signed)
Progress Note  Patient Name: Tina Pacheco Date of Encounter: 06/01/2020  [facility] Cardiologist: Ida Rogue, MD   Subjective   Plan for rest cardiac images today. UOP -2.4L. kidney function stable. BP still mildly elevated. Patient is feeling good today. No chest pain or sob. Remains is SR/SB.   Inpatient Medications    Scheduled Meds: . amiodarone  400 mg Oral BID  . amLODipine  5 mg Oral QPM  . apixaban  5 mg Oral BID  . atorvastatin  20 mg Oral QHS  . gabapentin  300 mg Oral QID  . leflunomide  20 mg Oral Daily  . metoprolol succinate  50 mg Oral Daily  . montelukast  10 mg Oral QHS  . oxyCODONE  20 mg Oral BID  . predniSONE  5 mg Oral Q breakfast  . silver sulfADIAZINE   Topical BID  . sodium chloride flush  3 mL Intravenous Q12H  . spironolactone  25 mg Oral Daily  . terazosin  10 mg Oral QHS  . torsemide  40 mg Oral Daily   Continuous Infusions: . sodium chloride     PRN Meds: sodium chloride, acetaminophen, albuterol, nitroGLYCERIN, ondansetron (ZOFRAN) IV, sodium chloride flush   Vital Signs    Vitals:   05/31/20 2242 06/01/20 0016 06/01/20 0500 06/01/20 0730  BP:  (!) 133/57 (!) 147/65 (!) 142/67  Pulse: (!) 59 72 (!) 56 60  Resp: 11 (!) 24 12 16   Temp:  98.2 F (36.8 C) 97.9 F (36.6 C) 97.8 F (36.6 C)  TempSrc:   Oral Oral  SpO2: 94% 97% 96% 95%  Weight:      Height:        Intake/Output Summary (Last 24 hours) at 06/01/2020 0946 Last data filed at 06/01/2020 0745 Gross per 24 hour  Intake 1004.49 ml  Output 1900 ml  Net -895.51 ml   Last 3 Weights 05/31/2020 05/30/2020 05/30/2020  Weight (lbs) 301 lb 6.4 oz 317 lb 318 lb 12.6 oz  Weight (kg) 136.714 kg 143.79 kg 144.6 kg      Telemetry    SR/SB, HR 50-60s - Personally Reviewed  ECG    No new tracing - Personally Reviewed  Physical Exam   GEN: No acute distress.   Neck: No JVD Cardiac: RRR, no murmurs, rubs, or gallops.  Respiratory: Clear to auscultation bilaterally. GI:  Soft, nontender, non-distended  MS: No edema; No deformity. Neuro:  Nonfocal  Psych: Normal affect   Labs    High Sensitivity Troponin:   Recent Labs  Lab 05/28/20 2031 05/28/20 2209 05/29/20 0040 05/29/20 0243 05/30/20 0754  TROPONINIHS 17 28* 35* 37* 37*      Chemistry Recent Labs  Lab 05/28/20 2031 05/29/20 0243 05/30/20 0349 05/31/20 0342 05/31/20 1552 06/01/20 0402  NA 135   < > 133* 131*  --  136  K 3.4*   < > 3.9 2.6* 3.3* 3.9  CL 94*   < > 92* 89*  --  95*  CO2 27   < > 27 32  --  32  GLUCOSE 127*   < > 98 112*  --  103*  BUN 22   < > 26* 29*  --  32*  CREATININE 1.10*   < > 1.13* 1.30*  --  1.23*  CALCIUM 10.1   < > 9.7 9.3  --  10.4*  PROT 6.9  --   --  6.6  --  6.7  ALBUMIN 3.9  --   --  3.5  --  3.6  AST 36  --   --  24  --  25  ALT 23  --   --  18  --  17  ALKPHOS 103  --   --  87  --  93  BILITOT 1.6*  --   --  1.1  --  1.2  GFRNONAA 54*   < > 52* 44*  --  47*  ANIONGAP 14   < > 14 10  --  9   < > = values in this interval not displayed.     Hematology Recent Labs  Lab 05/28/20 2031 05/31/20 0342 06/01/20 0402  WBC 9.6 6.2 7.7  RBC 4.70 4.76 4.41  HGB 13.6 13.8 13.1  HCT 42.7 43.3 40.2  MCV 90.9 91.0 91.2  MCH 28.9 29.0 29.7  MCHC 31.9 31.9 32.6  RDW 15.4 14.9 14.6  PLT 253 214 224    BNP Recent Labs  Lab 05/28/20 2031  BNP 57.6     DDimer No results for input(s): DDIMER in the last 168 hours.   Radiology    No results found.  Cardiac Studies    Echo 05/29/20 1. Left ventricular ejection fraction, by estimation, is 60 to 65%. The  left ventricle has normal function. The left ventricle has no regional  wall motion abnormalities. There is mild left ventricular hypertrophy.  Left ventricular diastolic parameters  are consistent with Grade I diastolic dysfunction (impaired relaxation).  2. Right ventricular systolic function is normal. The right ventricular  size is normal. Tricuspid regurgitation signal is inadequate  for assessing  PA pressure.  3. Left atrial size was moderately dilated.    Patient Profile     70 y.o. female with pmh of nonobstructive cAD, HFpEF, PAF on Eliquis, HTN, HLD, COPD, rheumatoid arthritis, falls, morbid obesity, OSA who is being seen fro Afib RVR.   Assessment & Plan    Acute hypoxic respiratory failure Acute on chronic HFpEF -Suspect volume overload in the setting of A. fib RVR -IV Lasix 80 mg twice daily> held 4/4 for rising creatinine and started on home torsemide 40mg  daily -Appears euvolemic on exam -Continue spironolactone and BB - Echo showed normal LVSF, G1DD, mod dilated LA -Weight appear inaccurate today. Suspect a dry weight 310 pounds.   PAF with RVR -Remains in sinus rhythm/SB -Transition IV amiodarone to oral amio -Continue Toprol -CHA2DS2-VASc at least 6.  Continue Eliquis  Chest pain/elevated troponin -Chest pain in the setting of A. fib RVR -Troponin minimally elevated -Echo with normal LV function and no WMA -Stress images yesterday with rest images today -No aspirin with Eliquis  Hypertension - amlodipine 5mg  daily, Toprol-XL 50 mg daily, spiro - pressures mildly elevated. I will increase amlodipine.  Hyperlipidemia - Continue statin  Obesity Possible OSA -Commend outpatient sleep study  Hypokalemia - improved,  3.6 today  For questions or updates, please contact [place] Please consult [email or web address] for contact info under        Signed, Cheyne Boulden Ninfa Meeker, PA-C  06/01/2020, 9:46 AM

## 2020-06-01 NOTE — Progress Notes (Signed)
PROGRESS NOTE  Tina Pacheco MQK:[id number] DOB: July 31, 1950 DOA: 05/28/2020 PCP: Venia Carbon, MD   LOS: 4 days   Brief Narrative / Interim history: 70 year old female with history of paroxysmal A. fib on Eliquis, diastolic CHF, COPD, nonobstructive CAD, RA, HTN, HLD, chronic pain, obesity comes into the hospital with sudden onset of chest pain shortness of breath.  Over the last week or more she has noticed that she is having more weight gain, saw by her cardiologist 4 days ago at that time given significant weight gain her torsemide dose was increased.  She was in sinus rhythm in the office.  She has noticed improvement in her weight but all of a sudden prior to admission experienced chest pain, shortness of breath, upper body heaviness and came to the ER.  In the ER she was found to be fluid overloaded, chest x-ray showed mild pulmonary edema and she was in A. fib with RVR.  She did not respond to diltiazem and was placed on amiodarone infusion.  She was given IV Lasix and admitted to the hospital. Cardiology consulted. Due to intermittent chest pains while hospitalized she underwent a stress test which was abnormal, and a cardiac catheterization is planned for Friday once off Eliquis for 2 days   Subjective / 24h Interval events: Feels well, denies chest pain, denies shortness of breath. No abdominal pain, no nausea or vomiting.   Assessment & Plan: Principal Problem Acute hypoxic respiratory failure due to acute pulmonary edema in the setting of acute on chronic diastolic heart failure -she is compliant with all her medications however it seems that at times she is struggling with her diet and also with her fluid intake.  Suspect this may be part of the problem -cardiology consulted. She was initially diuresed with IV Lasix for few days with excellent response, and once Cr started to rise IV diuresis was converted to po torsemide. She is now net negative ~8L and weight this morning is 301  (standing) from 319 (in bed) on admission -echo showed normal EF, grade 1 DD  Active Problems Paroxysmal A. fib, now with RVR -Patient has been started on amiodarone infusion given the fact that she did not respond to IV diltiazem.  She is chronically anticoagulated with Eliquis. She is now alternating between sinus rhythm and rate controlled A fib, rates in the 60-70s.  Chest pain -with her episodes of A fib mostly but also outside. She underwent a stress test on 4/4-4/5 (2 day test) which was abnormal, showing reversible defect in the mi/apical anterior/lateral distribution. D/w cardiology, patient will have a cath this Friday, she needs to be off Eliquis until then. Place on heparin gtt meanwhile.  Hypokalemia - due to lasix, replete K as needed  Creatinine elevation - up to 1.3, IV lasix stopped, on torsemide, Cr 1.23 his morning. Monitor   Elevated troponin -Overall flat, not in a pattern consistent with ACS, she has intermittent chest pains likely related to RVR.   -Myoview positive as above, she will get a cath on Friday   COPD -No wheezing, this appears stable  Essential hypertension -Continue home medications  Hyperlipidemia-continue atorvastatin  Rheumatoid arthritis, chronic pain-continue home medications  Scheduled Meds: . amiodarone  400 mg Oral BID  . amLODipine  10 mg Oral QPM  . [START ON 06/02/2020] aspirin EC  81 mg Oral Daily  . atorvastatin  20 mg Oral QHS  . gabapentin  300 mg Oral QID  . leflunomide  20 mg Oral  Daily  . metoprolol succinate  50 mg Oral Daily  . montelukast  10 mg Oral QHS  . oxyCODONE  20 mg Oral BID  . predniSONE  5 mg Oral Q breakfast  . silver sulfADIAZINE   Topical BID  . sodium chloride flush  3 mL Intravenous Q12H  . spironolactone  25 mg Oral Daily  . terazosin  10 mg Oral QHS  . torsemide  40 mg Oral Daily   Continuous Infusions: . sodium chloride     PRN Meds:.sodium chloride, acetaminophen, albuterol, nitroGLYCERIN,  ondansetron (ZOFRAN) IV, sodium chloride flush  Diet Orders (From admission, onward)    Start     Ordered   05/31/20 1305  Diet Heart Room service appropriate? Yes; Fluid consistency: Thin  Diet effective now       Question Answer Comment  Room service appropriate? Yes   Fluid consistency: Thin      05/31/20 1304          DVT prophylaxis:      Code Status: Full Code  Family Communication: no family present   Status is: Inpatient  Remains inpatient appropriate because:Inpatient level of care appropriate due to severity of illness   Dispo: The patient is from: Home              Anticipated d/c is to: Home              Patient currently is not medically stable to d/c.   Difficult to place patient No  Level of care: Progressive Cardiac  Consultants:  Cardiology  Procedures:  2D echo  Microbiology  none  Antimicrobials: none    Objective: Vitals:   06/01/20 0730 06/01/20 0900 06/01/20 1011 06/01/20 1200  BP: (!) 142/68   (!) 168/73  Pulse: 60 62    Resp: 16 15  18   Temp: 97.8 F (36.6 C) 98.2 F (36.8 C)  98.4 F (36.9 C)  TempSrc: Oral Oral  Oral  SpO2: 95% 95%    Weight:   (!) 141.4 kg   Height:        Intake/Output Summary (Last 24 hours) at 06/01/2020 1515 Last data filed at 06/01/2020 1330 Gross per 24 hour  Intake 920 ml  Output 2350 ml  Net -1430 ml   Filed Weights   05/30/20 0700 05/31/20 1530 06/01/20 1011  Weight: (!) 143.8 kg (!) 136.7 kg (!) 141.4 kg    Examination:  Constitutional: nad, in bed Eyes: no icterus  ENMT: mmm Neck: normal, supple Respiratory: no wheezing, no crackles Cardiovascular: irregular, no mrg, no edema  Abdomen: soft, nt, nd, bs+ Musculoskeletal: no clubbing / cyanosis.  Skin: no rashes seen Neurologic: non focal    Data Reviewed: I have independently reviewed following labs and imaging studies   CBC: Recent Labs  Lab 05/28/20 2031 05/31/20 0342 06/01/20 0402  WBC 9.6 6.2 7.7  NEUTROABS 5.4  --    --   HGB 13.6 13.8 13.1  HCT 42.7 43.3 40.2  MCV 90.9 91.0 91.2  PLT 253 214 938   Basic Metabolic Panel: Recent Labs  Lab 05/28/20 2031 05/29/20 0243 05/30/20 0349 05/31/20 0342 05/31/20 1552 06/01/20 0402  NA 135 137 133* 131*  --  136  K 3.4* 3.5 3.9 2.6* 3.3* 3.9  CL 94* 96* 92* 89*  --  95*  CO2 27 25 27  32  --  32  GLUCOSE 127* 129* 98 112*  --  103*  BUN 22 20 26* 29*  --  32*  CREATININE 1.10* 1.09* 1.13* 1.30*  --  1.23*  CALCIUM 10.1 10.1 9.7 9.3  --  10.4*  MG 1.7  --   --   --   --   --    Liver Function Tests: Recent Labs  Lab 05/28/20 2031 05/31/20 0342 06/01/20 0402  AST 36 24 25  ALT 23 18 17   ALKPHOS 103 87 93  BILITOT 1.6* 1.1 1.2  PROT 6.9 6.6 6.7  ALBUMIN 3.9 3.5 3.6   Coagulation Profile: Recent Labs  Lab 05/28/20 2031  INR 1.2   HbA1C: No results for input(s): HGBA1C in the last 72 hours. CBG: No results for input(s): GLUCAP in the last 168 hours.  Recent Results (from the past 240 hour(s))  SARS CORONAVIRUS 2 (TAT 6-24 HRS) Nasopharyngeal Nasopharyngeal Swab     Status: None   Collection Time: 05/28/20 11:06 PM   Specimen: Nasopharyngeal Swab  Result Value Ref Range Status   SARS Coronavirus 2 NEGATIVE NEGATIVE Final    Comment: (NOTE) SARS-CoV-2 target nucleic acids are NOT DETECTED.  The SARS-CoV-2 RNA is generally detectable in upper and lower respiratory specimens during the acute phase of infection. Negative results do not preclude SARS-CoV-2 infection, do not rule out co-infections with other pathogens, and should not be used as the sole basis for treatment or other patient management decisions. Negative results must be combined with clinical observations, patient history, and epidemiological information. The expected result is Negative.  Fact Sheet for Patients: SugarRoll.be  Fact Sheet for Healthcare Providers: https://www.woods-mathews.com/  This test is not yet approved or  cleared by the [place] FDA and  has been authorized for detection and/or diagnosis of SARS-CoV-2 by FDA under an Emergency Use Authorization (EUA). This EUA will remain  in effect (meaning this test can be used) for the duration of the COVID-19 declaration under Se ction 564(b)(1) of the Act, 21 U.S.C. section 360bbb-3(b)(1), unless the authorization is terminated or revoked sooner.  Performed at [facility], [place] [address], [place], [place] [address]      Radiology Studies: NM Myocar Multi W/Spect W/Wall Motion / EF  Result Date: 06/01/2020  Abnormal pharmacologic myocardial perfusion stress test.  There is a large in size, moderate in severity, completely reversible defect involving the mid anterior, anterolateral, and inferolateral segments, as well as the apical anterior and lateral segments. Defect is concerning for ischemia, though artifact cannot be excluded (shifting breast attenuation).  Left ventricular systolic function is normal (LVEF 58%).  Attenuation correction CT shows coronary artery calcification, aortic atherosclerosis, and dense mitral annular calcification.  This is an intermediate risk study.  Sensitivity and specificity of the study are degraded by the patient's morbid obesity.    Marzetta Board, MD, PhD [facility]  Between 7 am - 7 pm I am available, please contact me via Amion (for emergencies) or Securechat (non urgent messages)  Between 7 pm - 7 am I am not available, please contact night coverage MD/APP via Amion

## 2020-06-01 NOTE — TOC Transition Note (Signed)
Transition of Care [facility]) - CM/SW Discharge Note   Patient Details  Name: Tina Pacheco MRN: [id number] Date of Birth: 07-Mar-1950  [facility]) CM/SW Contact:  Eileen Stanford, LCSW Phone Number: 06/01/2020, 12:47 PM   Clinical Narrative: Pt states she lives at home with her spouse. Pt's spouse takes hr to doctor appointments. Pt sees Dr Silvio Pate. Pt gets her meds from [place] [facility]. Pt has no issues getting meds. Pt also weighs herself daily and understands to contact her md if there is weight gain.        Barriers to Discharge: Continued Medical Work up   Patient Goals and CMS Choice Patient states their goals for this hospitalization and ongoing recovery are:: to get home      Discharge Placement                       Discharge Plan and Services In-house Referral: Clinical Social Work   Post Acute Care Choice: NA                               Social Determinants of Health (SDOH) Interventions     Readmission Risk Interventions Readmission Risk Prevention Plan 06/01/2020  Transportation Screening Complete  PCP or Specialist Appt within 3-5 Days Complete  HRI or [place] Complete  Social Work Consult for [place] Planning/Counseling Complete  Palliative Care Screening Not Applicable  Medication Review Press photographer) (No Data)  Some recent data might be hidden

## 2020-06-01 NOTE — Consult Note (Signed)
   Heart Failure Nurse Navigator Note  HFpEf status 65%.  Mild LVH.  Grade 1 diastolic dysfunction normal right ventricular systolic function.  Mild left atrial enlargement.  She presented to the emergency room with complaints of bilateral arm weakness, midsternal chest discomfort and shortness of breath.  Comorbidities:  Nonobstructive coronary artery disease Paroxysmal atrial fibrillation Asthma/COPD Hyperlipidemia Hypertension Rheumatoid arthritis Sleep apnea noncompliant with CPAP   Medications:  Amiodarone 400 mg 2 times a day Eliquis 5 mg 2 times a day Atorvastatin 20 mg daily Metoprolol succinate 50 mg daily Spironolactone 25 mg daily Torsemide 40 mg daily  Labs:  Sodium 136, potassium 3.9, chloride 95, CO2 32, BUN 32, creatinine 1.23 Intake 1004 mL Output 2400 mL Weight 311 pounds by open bed weight yesterday weight 301 bed weight does not appear accurate.   Assessment:  General -morbidly obese female lying quietly in bed in no acute distress.  HEENT-pupils are equal, unable to assess for JVD due to body habitus.  Cardiac-heart tones of regular rate and rhythm.  Chest-breath sounds are clear to auscultation bilaterally.  Abdomen-rounded soft nontender  Muscular skeletal-there is no lower extremity edema.  Psych-is pleasant and appropriate, makes good eye contact.   Neurologic-speech is clear moves all extremities without difficulty.   Met with patient today, she states that she weighs herself daily at home.  Reinforced to call for weight gain of 2 to 3 pounds overnight or 5 pounds within a week.  She states yesterday here at the hospital with a bed weight she weighed 301 now today she weighed 311.  Discussed her diet.  She admits to drinking a lot of fluids throughout the day.  Will drink 320 ounce powerful aids during the day in addition to Pepsi's and water.  Reminded her that Powerade contains 125 mg of sodium.  Reminded of 2000 cc fluid  restriction in 24 hours voices understanding.  Discussed her appointment for follow-up in the outpatient heart failure clinic.  Pricilla Riffle RN [facility]

## 2020-06-01 NOTE — Plan of Care (Signed)

## 2020-06-01 NOTE — Consult Note (Addendum)
[place] for Eliquis >> Hep gtt Indication: atrial fibrillation (cath to w/u abn'l stress test on Friday 4/8)  Allergies  Allergen Reactions  . Fluoxetine     Headache, shaking, sleep issues  . Codeine     Nausea and vomiting/only when taking too much    Patient Measurements: Height: 5\' 3"  (160 cm) Weight: (!) 141.4 kg (311 lb 11.2 oz) IBW/kg (Calculated) : 52.4 Heparin Dosing Weight: 89.3 kg  Vital Signs: Temp: 98.4 F (36.9 C) (04/05 1200) Temp Source: Oral (04/05 1200) BP: 168/73 (04/05 1200) Pulse Rate: 62 (04/05 0900)  Labs: Recent Labs    05/30/20 0349 05/30/20 0754 05/31/20 0342 06/01/20 0402  HGB  --   --  13.8 13.1  HCT  --   --  43.3 40.2  PLT  --   --  214 224  CREATININE 1.13*  --  1.30* 1.23*  TROPONINIHS  --  37*  --   --     Estimated Creatinine Clearance: 59.1 mL/min (A) (by C-G formula based on SCr of 1.23 mg/dL (H)).   Medications:  No AC/APT related drug allergies Inpatient: eliquis 5mg  BID (last dose 4/5 0900), asa 81mg  QD,  PTA: eliquis 5mg  BID  Heparin Dosing Weight: 89.3 kg  Assessment: 70 yo F w/ h/o parAFib (on Eliquis), diaCHF, COPD, nonobs CAD, RA, HTN, HLD, chronic pain, obesity comes into the hospital with sudden onset of chest pain shortness of breath.  In the ER she was found to be fluid overloaded despite recent outpatient up-titration of diuretic, chest x-ray showed mild pulmonary edema and she was noted to be in A. fib with RVR. On day 4 of admission (4/5) pt had abnormal stress test and planning heart cath on Friday to further assess. Pharmacy consulted for the transition of Eliquis to Heparin gtt during interim.    BL labs: aPTT (32s), INR 1.2, H/H/plts - WNL  Goal of Therapy:  Heparin level 0.3-0.7 units/ml aPTT 66-102s seconds Monitor platelets by anticoagulation protocol: Yes   Plan:  -- Hold Eliquis (last dose 4/5 0900). -- Check BL aPTT/anti-Xa 4/5@2030  prior to  infusion.  --Initiate heparin at next dosing interval (4/5 2100).  Heparin plan Give 4000 units bolus x 1 (4/5 2100) Start heparin infusion at 1300 units/hr (4/5 2100) Check aPTT in 8 hours and daily while on heparin (4/6 0500) Continue to monitor H&H and platelets  Shanon Brow Charmian Forbis 06/01/2020,3:35 PM

## 2020-06-01 NOTE — Progress Notes (Signed)
Mobility Specialist - Progress Note   06/01/20 1500  Mobility  Activity Ambulated in room  Range of Motion/Exercises Right leg;Left leg (AP, SLR, ABD, HS, QS)  Level of Assistance Minimal assist, patient does 75% or more  Assistive Device Front wheel walker  Distance Ambulated (ft) 20 ft  Mobility Response Tolerated well  Mobility performed by Mobility specialist  $Mobility charge 1 Mobility    Pre-mobility: 62 HR, 91% SpO2 During mobility: 83 HR, 94% SpO2 Post-mobility: 67 HR, 97% SpO2   Pt participated in bed-level therex: ankle pumps, straight leg raises, abduction, heel slides, and quad sets. Pt states that she feels like she's using all extremities to properly perform straight leg raises and abduction. Pt sat EOB with supervision and extra time. Pt reports pain in back with movement. BLE weakness with more difficulty in lifting RLE during ambulation. 1 standing rest break. Fair standing balance with LOB noted x2, corrected by CGA. Pt returned supine with supervision and extra time. Repositioned to [facility] with use of bed rails. Rolator use at baseline. MD entered mid-session. Daughter at bedside. RA.   Kathee Delton Mobility Specialist 06/01/20, 3:50 PM

## 2020-06-02 DIAGNOSIS — I480 Paroxysmal atrial fibrillation: Secondary | ICD-10-CM

## 2020-06-02 DIAGNOSIS — G894 Chronic pain syndrome: Secondary | ICD-10-CM | POA: Diagnosis not present

## 2020-06-02 DIAGNOSIS — J449 Chronic obstructive pulmonary disease, unspecified: Secondary | ICD-10-CM

## 2020-06-02 DIAGNOSIS — I4891 Unspecified atrial fibrillation: Secondary | ICD-10-CM | POA: Diagnosis not present

## 2020-06-02 DIAGNOSIS — M069 Rheumatoid arthritis, unspecified: Secondary | ICD-10-CM

## 2020-06-02 DIAGNOSIS — I509 Heart failure, unspecified: Secondary | ICD-10-CM | POA: Diagnosis not present

## 2020-06-02 LAB — COMPREHENSIVE METABOLIC PANEL
ALT: 18 U/L (ref 0–44)
AST: 24 U/L (ref 15–41)
Albumin: 3.4 g/dL — ABNORMAL LOW (ref 3.5–5.0)
Alkaline Phosphatase: 83 U/L (ref 38–126)
Anion gap: 11 (ref 5–15)
BUN: 30 mg/dL — ABNORMAL HIGH (ref 8–23)
CO2: 31 mmol/L (ref 22–32)
Calcium: 10.1 mg/dL (ref 8.9–10.3)
Chloride: 92 mmol/L — ABNORMAL LOW (ref 98–111)
Creatinine, Ser: 1.1 mg/dL — ABNORMAL HIGH (ref 0.44–1.00)
GFR, Estimated: 54 mL/min — ABNORMAL LOW (ref >60)
Glucose, Bld: 100 mg/dL — ABNORMAL HIGH (ref 70–99)
Potassium: 3.8 mmol/L (ref 3.5–5.1)
Sodium: 134 mmol/L — ABNORMAL LOW (ref 135–145)
Total Bilirubin: 1.4 mg/dL — ABNORMAL HIGH (ref 0.3–1.2)
Total Protein: 6.3 g/dL — ABNORMAL LOW (ref 6.5–8.1)

## 2020-06-02 LAB — MAGNESIUM: Magnesium: 1.9 mg/dL (ref 1.7–2.4)

## 2020-06-02 LAB — CBC
HCT: 38.7 % (ref 36.0–46.0)
Hemoglobin: 12.4 g/dL (ref 12.0–15.0)
MCH: 29.5 pg (ref 26.0–34.0)
MCHC: 32 g/dL (ref 30.0–36.0)
MCV: 91.9 fL (ref 80.0–100.0)
Platelets: 218 10*3/uL (ref 150–400)
RBC: 4.21 MIL/uL (ref 3.87–5.11)
RDW: 14.6 % (ref 11.5–15.5)
WBC: 6.8 10*3/uL (ref 4.0–10.5)
nRBC: 0 % (ref 0.0–0.2)

## 2020-06-02 LAB — APTT
aPTT: 60 seconds — ABNORMAL HIGH (ref 24–36)
aPTT: 54 seconds — ABNORMAL HIGH (ref 24–36)
aPTT: 73 seconds — ABNORMAL HIGH (ref 24–36)

## 2020-06-02 LAB — HEPARIN LEVEL (UNFRACTIONATED): Heparin Unfractionated: >2.2 IU/mL — ABNORMAL HIGH (ref 0.30–0.70)

## 2020-06-02 MED ORDER — HEPARIN BOLUS VIA INFUSION
1300.0000 [IU] | Freq: Once | INTRAVENOUS | Status: AC
  Administered 2020-06-02: 1300 [IU] via INTRAVENOUS
  Filled 2020-06-02: qty 1300

## 2020-06-02 MED ORDER — HEPARIN (PORCINE) 25000 UT/250ML-% IV SOLN
1600.0000 [IU]/h | INTRAVENOUS | Status: DC
  Administered 2020-06-02 (×2): 1450 [IU]/h via INTRAVENOUS
  Administered 2020-06-03: 1600 [IU]/h via INTRAVENOUS
  Filled 2020-06-02: qty 250

## 2020-06-02 NOTE — Consult Note (Signed)
[place] for Eliquis >> Hep gtt Indication: atrial fibrillation (cath to w/u abn'l stress test on Friday 4/8)  Allergies  Allergen Reactions  . Fluoxetine     Headache, shaking, sleep issues  . Codeine     Nausea and vomiting/only when taking too much    Patient Measurements: Height: 5\' 3"  (160 cm) Weight: (!) 144.6 kg (318 lb 12.6 oz) IBW/kg (Calculated) : 52.4 Heparin Dosing Weight: 89.3 kg  Vital Signs: Temp: 98.1 F (36.7 C) (04/06 1233) Temp Source: Oral (04/06 1233) BP: 140/50 (04/06 1233) Pulse Rate: 57 (04/06 1233)  Labs: Recent Labs    05/31/20 0342 06/01/20 0402 06/01/20 2026 06/02/20 0401 06/02/20 1429  HGB 13.8 13.1  --  12.4  --   HCT 43.3 40.2  --  38.7  --   PLT 214 224  --  218  --   APTT  --   --   --  60* 54*  HEPARINUNFRC  --   --  >2.20* >2.20*  --   CREATININE 1.30* 1.23*  --  1.10*  --     Estimated Creatinine Clearance: 67.1 mL/min (A) (by C-G formula based on SCr of 1.1 mg/dL (H)).   Medications:  No AC/APT related drug allergies Inpatient: eliquis 5mg  BID (last dose 4/5 0900), asa 81mg  QD,  PTA: eliquis 5mg  BID  Heparin Dosing Weight: 89.3 kg  Assessment: 70 yo F w/ h/o parAFib (on Eliquis), diaCHF, COPD, nonobs CAD, RA, HTN, HLD, chronic pain, obesity comes into the hospital with sudden onset of chest pain shortness of breath.  In the ER she was found to be fluid overloaded despite recent outpatient up-titration of diuretic, chest x-ray showed mild pulmonary edema and she was noted to be in A. fib with RVR. On day 4 of admission (4/5) pt had abnormal stress test and planning heart cath on Friday to further assess. Pharmacy consulted for the transition of Eliquis to Heparin gtt during interim.    BL labs: aPTT (32s), INR 1.2, H/H/plts - WNL  Goal of Therapy:  Heparin level 0.3-0.7 units/ml Anti-Xa level 0.6-1 units/ml 4hrs after LMWH dose given Monitor platelets by anticoagulation  protocol: Yes  -aPTT range 66-102  4/6 0401 aPTT 60 subtherapeutic, HL remains >2.2, 1300 bolus, increase to 1450 units/hr 4/6 1429 aPTT 54 subtherapeutic     Plan  APTT level lower than before following bolus and dose increase, spoke to nurse to see if any problems with line, per nurse: line ran dry before new bag hung  Unsure how long line was dry, will slightly increase heparin infusion to 1600 units/hr  Re-check aPTT in 6 hrs and daily with HL until they correlate.  Continue to monitor H&H and platelets.  Dorothe Pea, PharmD, BCPS Clinical Pharmacist  06/02/2020 3:35 PM

## 2020-06-02 NOTE — Progress Notes (Signed)
[facility] NAME: Tina Pacheco    MR#:  [id number]  DATE OF BIRTH:  12/05/1950  SUBJECTIVE:   Denies any complaints. Husband at bedside. IV heparin drip. Anticipated cardiac cath on Friday REVIEW OF SYSTEMS:   Review of Systems  Constitutional: Negative for chills, fever and weight loss.  HENT: Negative for ear discharge, ear pain and nosebleeds.   Eyes: Negative for blurred vision, pain and discharge.  Respiratory: Negative for sputum production, shortness of breath, wheezing and stridor.   Cardiovascular: Negative for chest pain, palpitations, orthopnea and PND.  Gastrointestinal: Negative for abdominal pain, diarrhea, nausea and vomiting.  Genitourinary: Negative for frequency and urgency.  Musculoskeletal: Negative for back pain and joint pain.  Neurological: Positive for weakness. Negative for sensory change, speech change and focal weakness.  Psychiatric/Behavioral: Negative for depression and hallucinations. The patient is not nervous/anxious.    Tolerating Diet:yes Tolerating PT:   DRUG ALLERGIES:   Allergies  Allergen Reactions  . Fluoxetine     Headache, shaking, sleep issues  . Codeine     Nausea and vomiting/only when taking too much    VITALS:  Blood pressure (!) 140/50, pulse (!) 57, temperature 98.1 F (36.7 C), temperature source Oral, resp. rate 18, height 5\' 3"  (1.6 m), weight (!) 144.6 kg, SpO2 93 %.  PHYSICAL EXAMINATION:   Physical Exam  GENERAL:  70 y.o.-year-old patient lying in the bed with no acute distress.  Morbid obesity LUNGS: Normal breath sounds bilaterally, no wheezing, rales, rhonchi. No use of accessory muscles of respiration.  CARDIOVASCULAR: S1, S2 normal. No murmurs, rubs, or gallops.  ABDOMEN: Soft, nontender, nondistended. Bowel sounds present. No organomegaly or mass.  EXTREMITIES: No cyanosis, clubbing or edema b/l.    NEUROLOGIC: Cranial nerves II through XII are intact. No  focal Motor or sensory deficits b/l.   PSYCHIATRIC:  patient is alert and oriented x 3.  SKIN: No obvious rash, lesion, or ulcer.   LABORATORY PANEL:  CBC Recent Labs  Lab 06/02/20 0401  WBC 6.8  HGB 12.4  HCT 38.7  PLT 218    Chemistries  Recent Labs  Lab 06/02/20 0401  NA 134*  K 3.8  CL 92*  CO2 31  GLUCOSE 100*  BUN 30*  CREATININE 1.10*  CALCIUM 10.1  MG 1.9  AST 24  ALT 18  ALKPHOS 83  BILITOT 1.4*   Cardiac Enzymes No results for input(s): TROPONINI in the last 168 hours. RADIOLOGY:  NM Myocar Multi W/Spect W/Wall Motion / EF  Result Date: 06/01/2020  Abnormal pharmacologic myocardial perfusion stress test.  There is a large in size, moderate in severity, completely reversible defect involving the mid anterior, anterolateral, and inferolateral segments, as well as the apical anterior and lateral segments. Defect is concerning for ischemia, though artifact cannot be excluded (shifting breast attenuation).  Left ventricular systolic function is normal (LVEF 58%).  Attenuation correction CT shows coronary artery calcification, aortic atherosclerosis, and dense mitral annular calcification.  This is an intermediate risk study.  Sensitivity and specificity of the study are degraded by the patient's morbid obesity.    ASSESSMENT AND PLAN:  70 year old female with history of paroxysmal A. fib on Eliquis, diastolic CHF, COPD, nonobstructive CAD, RA, HTN, HLD, chronic pain, obesity comes into the hospital with sudden onset of chest pain shortness of breath. In the ER she was found to be fluid overloaded, chest x-ray showed mild pulmonary edema and she was in  A. fib with RVR.  She did not respond to diltiazem and was placed on amiodarone infusion  Acute hypoxic respiratory failure due to acute pulmonary edema in the setting of acute on chronic diastolic heart failure --she is compliant with all her medications however it seems that at times she is struggling with her  diet and also with her fluid intake.  Suspect this may be part of the problem --She has been started on IV Lasix, she is responding well, however developed mild AKI this morning. Net negative 5.7 L. Hold lasix, appreciate cards input --echo yesterday showed normal EF, grade 1 DD --resumed torsemide by cardiology  Paroxysmal A. fib, now with RVR -Patient had been on amiodarone infusion given the fact that she did not respond to IV diltiazem.  -- She is chronically anticoagulated with Eliquis (onhold) -- IV heparin drip for anticoagulation -- patient currently on oral amiodarone 400 mg BID and metoprolol.  Hypokalemia -  replete potassium  AKIsuspect due to over diuresis with IV Lasix  -- currently on oral torsemide --creat 1.1 today  Elevated troponin -Overall flat, not in a pattern consistent with ACS, she has intermittent chest pains likely related to RVR.   -- two days stress test shows mid and apical anterior,  anterolateral and inflow lateral reversible defect. Cardiology plans to do heart catheterization on Friday once eliquis effect wears off  COPD -No wheezing, this appears stable  Essential hypertension -Continue home medications  Hyperlipidemia-continue atorvastatin  Rheumatoid arthritis, chronic pain-continue home medications  Morbid obesity-- complicates course and prognosis  Physical therapy to see patient  Procedures: Family communication : husband in the room Consults : cardiology CODE STATUS: full DVT Prophylaxis : heparin drip Level of care: Progressive Cardiac Status is: Inpatient  Remains inpatient appropriate because:Inpatient level of care appropriate due to severity of illness   Dispo: The patient is from: Home              Anticipated d/c is to: Home              Patient currently is not medically stable to d/c.   Difficult to place patient No   Abnormal myoview stress test. Cardiac cath plan by cardiology on Friday, April 8th      TOTAL TIME TAKING CARE OF THIS PATIENT: 30 minutes.  >50% time spent on counselling and coordination of care  Note: This dictation was prepared with Dragon dictation along with smaller phrase technology. Any transcriptional errors that result from this process are unintentional.  Fritzi Mandes M.D    [facility]   CC: Primary care physician; Venia Carbon, MDPatient ID: Tina Pacheco, female   DOB: 1950-11-10, 70 y.o.   MRN: [id number]

## 2020-06-02 NOTE — Consult Note (Signed)
[facility] for Eliquis >> Hep gtt Indication: atrial fibrillation (cath to w/u abn'l stress test on Friday 4/8)  Allergies  Allergen Reactions  . Fluoxetine     Headache, shaking, sleep issues  . Codeine     Nausea and vomiting/only when taking too much    Patient Measurements: Height: 5\' 3"  (160 cm) Weight: (!) 144.6 kg (318 lb 12.6 oz) IBW/kg (Calculated) : 52.4 Heparin Dosing Weight: 89.3 kg  Vital Signs: Temp: 98.6 F (37 C) (04/06 1943) Temp Source: Oral (04/06 1613) BP: 153/53 (04/06 1943) Pulse Rate: 64 (04/06 1943)  Labs: Recent Labs    05/31/20 0342 06/01/20 0402 06/01/20 2026 06/02/20 0401 06/02/20 1429 06/02/20 2140  HGB 13.8 13.1  --  12.4  --   --   HCT 43.3 40.2  --  38.7  --   --   PLT 214 224  --  218  --   --   APTT  --   --   --  60* 54* 73*  HEPARINUNFRC  --   --  >2.20* >2.20*  --   --   CREATININE 1.30* 1.23*  --  1.10*  --   --     Estimated Creatinine Clearance: 67.1 mL/min (A) (by C-G formula based on SCr of 1.1 mg/dL (H)).   Medications:  No AC/APT related drug allergies Inpatient: eliquis 5mg  BID (last dose 4/5 0900), asa 81mg  QD,  PTA: eliquis 5mg  BID  Heparin Dosing Weight: 89.3 kg  Assessment: 70 yo F w/ h/o parAFib (on Eliquis), diaCHF, COPD, nonobs CAD, RA, HTN, HLD, chronic pain, obesity comes into the hospital with sudden onset of chest pain shortness of breath.  In the ER she was found to be fluid overloaded despite recent outpatient up-titration of diuretic, chest x-ray showed mild pulmonary edema and she was noted to be in A. fib with RVR. On day 4 of admission (4/5) pt had abnormal stress test and planning heart cath on Friday to further assess. Pharmacy consulted for the transition of Eliquis to Heparin gtt during interim.    BL labs: aPTT (32s), INR 1.2, H/H/plts - WNL  Goal of Therapy:  Heparin level 0.3-0.7 units/ml Anti-Xa level 0.6-1 units/ml 4hrs after LMWH dose  given Monitor platelets by anticoagulation protocol: Yes  -aPTT range 66-102  4/6 0401 aPTT 60 subtherapeutic, HL remains >2.2, 1300 bolus, increase to 1450 units/hr 4/6 1429 aPTT 54 subtherapeutic  4/6 2140 aPTT 73, therapeutic x 1    Plan  Will continue heparin infusion at 1600 units/hr  Re-check aPTT in 6 hrs to confirm, then daily with HL until they correlate.  Continue to monitor H&H and platelets.  Renda Rolls, PharmD, [facility] 06/02/2020 11:47 PM

## 2020-06-02 NOTE — Consult Note (Signed)
[facility] for Eliquis >> Hep gtt Indication: atrial fibrillation (cath to w/u abn'l stress test on Friday 4/8)  Allergies  Allergen Reactions  . Fluoxetine     Headache, shaking, sleep issues  . Codeine     Nausea and vomiting/only when taking too much    Patient Measurements: Height: 5\' 3"  (160 cm) Weight: (!) 144.6 kg (318 lb 12.6 oz) IBW/kg (Calculated) : 52.4 Heparin Dosing Weight: 89.3 kg  Vital Signs: Temp: 97.6 F (36.4 C) (04/06 0419) Temp Source: Oral (04/05 1915) BP: 140/52 (04/06 0419) Pulse Rate: 56 (04/06 0419)  Labs: Recent Labs    05/30/20 0754 05/31/20 0342 05/31/20 0342 06/01/20 0402 06/01/20 2026 06/02/20 0401  HGB  --  13.8   < > 13.1  --  12.4  HCT  --  43.3  --  40.2  --  38.7  PLT  --  214  --  224  --  218  APTT  --   --   --   --   --  60*  HEPARINUNFRC  --   --   --   --  >2.20*  --   CREATININE  --  1.30*  --  1.23*  --  1.10*  TROPONINIHS 37*  --   --   --   --   --    < > = values in this interval not displayed.    Estimated Creatinine Clearance: 67.1 mL/min (A) (by C-G formula based on SCr of 1.1 mg/dL (H)).   Medications:  No AC/APT related drug allergies Inpatient: eliquis 5mg  BID (last dose 4/5 0900), asa 81mg  QD,  PTA: eliquis 5mg  BID  Heparin Dosing Weight: 89.3 kg  Assessment: 70 yo F w/ h/o parAFib (on Eliquis), diaCHF, COPD, nonobs CAD, RA, HTN, HLD, chronic pain, obesity comes into the hospital with sudden onset of chest pain shortness of breath.  In the ER she was found to be fluid overloaded despite recent outpatient up-titration of diuretic, chest x-ray showed mild pulmonary edema and she was noted to be in A. fib with RVR. On day 4 of admission (4/5) pt had abnormal stress test and planning heart cath on Friday to further assess. Pharmacy consulted for the transition of Eliquis to Heparin gtt during interim.    BL labs: aPTT (32s), INR 1.2, H/H/plts - WNL  Goal of  Therapy:  Heparin level 0.3-0.7 units/ml Anti-Xa level 0.6-1 units/ml 4hrs after LMWH dose given Monitor platelets by anticoagulation protocol: Yes  -aPTT range 66-102  4/6 0401 aPTT 60 subtherapeutic, HL remains >2.2   Plan:  -- Hold Eliquis (last dose 4/5 0900). -- Check BL aPTT/anti-Xa 4/5@2030  prior to infusion.  --Initiate heparin at next dosing interval (4/5 2100).  Heparin plan Give 1300 units bolus x 1 Increase heparin nfusion to 1450 units/hr  Recheck aPTT in 8 hrs and daily with HL until they correlate. Continue to monitor H&H and platelets.  Renda Rolls, PharmD, [facility] 06/02/2020 6:07 AM

## 2020-06-02 NOTE — Progress Notes (Addendum)
Mobility Specialist - Progress Note   06/02/20 1500  Mobility  Activity Ambulated in hall  Level of Assistance Standby assist, set-up cues, supervision of patient - no hands on  Assistive Device Front wheel walker  Distance Ambulated (ft) 80 ft  Mobility Response Tolerated well  Mobility performed by Mobility specialist  $Mobility charge 1 Mobility    Pre-mobility: 65 HR, 100% SpO2 During mobility: 93 HR, 95% SpO2 Post-mobility: 79 HR, 96% SpO2   Pt ambulated in hallway with RW. RA. No LOB. Several short rest breaks taken to manage pain as pt is heavily reliant on AD for UE support. Pt states she uses RUE to offset LLE pain. Shoulder pain after activity. Denies chest pain. Spouse at bedside. SBA.    Kathee Delton Mobility Specialist 06/02/20, 3:46 PM

## 2020-06-02 NOTE — Plan of Care (Signed)

## 2020-06-02 NOTE — Progress Notes (Signed)
Progress Note  Patient Name: Tina Pacheco Date of Encounter: 06/02/2020  [facility] Cardiologist: Ida Rogue, MD   Subjective   Feels well, denies chest pain or shortness of breath.  Husband at bedside.  States if possible will like heart cath performed on left arm instead of right.  Inpatient Medications    Scheduled Meds: . amiodarone  400 mg Oral BID  . amLODipine  10 mg Oral QPM  . aspirin EC  81 mg Oral Daily  . atorvastatin  20 mg Oral QHS  . gabapentin  300 mg Oral QID  . leflunomide  20 mg Oral Daily  . metoprolol succinate  50 mg Oral Daily  . montelukast  10 mg Oral QHS  . oxyCODONE  20 mg Oral BID  . predniSONE  5 mg Oral Q breakfast  . silver sulfADIAZINE   Topical BID  . sodium chloride flush  3 mL Intravenous Q12H  . spironolactone  25 mg Oral Daily  . terazosin  10 mg Oral QHS  . torsemide  40 mg Oral Daily   Continuous Infusions: . sodium chloride    . heparin 1,450 Units/hr (06/02/20 0629)   PRN Meds: sodium chloride, acetaminophen, albuterol, nitroGLYCERIN, ondansetron (ZOFRAN) IV, sodium chloride flush   Vital Signs    Vitals:   06/01/20 1915 06/02/20 0419 06/02/20 0755 06/02/20 1233  BP: (!) 136/55 (!) 140/52 (!) 136/48 (!) 140/50  Pulse: (!) 58 (!) 56 (!) 56 (!) 57  Resp: (!) 22 15 16 18   Temp: 97.7 F (36.5 C) 97.6 F (36.4 C) 98.5 F (36.9 C) 98.1 F (36.7 C)  TempSrc: Oral   Oral  SpO2: 92% 92% 94% 93%  Weight:  (!) 144.6 kg    Height:        Intake/Output Summary (Last 24 hours) at 06/02/2020 1308 Last data filed at 06/02/2020 1235 Gross per 24 hour  Intake 1960.56 ml  Output 2375 ml  Net -414.44 ml   Last 3 Weights 06/02/2020 06/01/2020 05/31/2020  Weight (lbs) 318 lb 12.6 oz 311 lb 11.2 oz 301 lb 6.4 oz  Weight (kg) 144.6 kg 141.386 kg 136.714 kg      Telemetry    Sinus rhythm, heart rate 59- Personally Reviewed  ECG    No new tracing obtained- Personally Reviewed  Physical Exam   GEN: No acute distress.    Neck: No JVD Cardiac: RRR, no murmurs, rubs, or gallops.  Respiratory: Clear to auscultation bilaterally. GI: Soft, nontender, non-distended  MS: No edema; No deformity. Neuro:  Nonfocal  Psych: Normal affect   Labs    High Sensitivity Troponin:   Recent Labs  Lab 05/28/20 2031 05/28/20 2209 05/29/20 0040 05/29/20 0243 05/30/20 0754  TROPONINIHS 17 28* 35* 37* 37*      Chemistry Recent Labs  Lab 05/31/20 0342 05/31/20 1552 06/01/20 0402 06/02/20 0401  NA 131*  --  136 134*  K 2.6* 3.3* 3.9 3.8  CL 89*  --  95* 92*  CO2 32  --  32 31  GLUCOSE 112*  --  103* 100*  BUN 29*  --  32* 30*  CREATININE 1.30*  --  1.23* 1.10*  CALCIUM 9.3  --  10.4* 10.1  PROT 6.6  --  6.7 6.3*  ALBUMIN 3.5  --  3.6 3.4*  AST 24  --  25 24  ALT 18  --  17 18  ALKPHOS 87  --  93 83  BILITOT 1.1  --  1.2 1.4*  GFRNONAA 44*  --  47* 54*  ANIONGAP 10  --  9 11     Hematology Recent Labs  Lab 05/31/20 0342 06/01/20 0402 06/02/20 0401  WBC 6.2 7.7 6.8  RBC 4.76 4.41 4.21  HGB 13.8 13.1 12.4  HCT 43.3 40.2 38.7  MCV 91.0 91.2 91.9  MCH 29.0 29.7 29.5  MCHC 31.9 32.6 32.0  RDW 14.9 14.6 14.6  PLT 214 224 218    BNP Recent Labs  Lab 05/28/20 2031  BNP 57.6     DDimer No results for input(s): DDIMER in the last 168 hours.   Radiology    NM Myocar Multi W/Spect W/Wall Motion / EF  Result Date: 06/01/2020  Abnormal pharmacologic myocardial perfusion stress test.  There is a large in size, moderate in severity, completely reversible defect involving the mid anterior, anterolateral, and inferolateral segments, as well as the apical anterior and lateral segments. Defect is concerning for ischemia, though artifact cannot be excluded (shifting breast attenuation).  Left ventricular systolic function is normal (LVEF 58%).  Attenuation correction CT shows coronary artery calcification, aortic atherosclerosis, and dense mitral annular calcification.  This is an intermediate risk  study.  Sensitivity and specificity of the study are degraded by the patient's morbid obesity.     Cardiac Studies   TTE 05/29/2020 1. Left ventricular ejection fraction, by estimation, is 60 to 65%. The  left ventricle has normal function. The left ventricle has no regional  wall motion abnormalities. There is mild left ventricular hypertrophy.  Left ventricular diastolic parameters  are consistent with Grade I diastolic dysfunction (impaired relaxation).  2. Right ventricular systolic function is normal. The right ventricular  size is normal. Tricuspid regurgitation signal is inadequate for assessing  PA pressure.  3. Left atrial size was moderately dilated.   Patient Profile     70 y.o. female with history of nonobstructive CAD, paroxysmal atrial fibrillation on Eliquis, hypertension, OSA, being seen for atrial fibrillation and also abnormal stress test.  Assessment & Plan    1.  Paroxysmal atrial fibrillation -Maintaining sinus rhythm -Continue amiodarone, heparin drip. -Switch to Eliquis upon discharge  2.  Abnormal Myoview, history of nonobstructive CAD -Plan for left heart cath Friday (48 hours after Eliquis dose) if EAN -Aspirin, Lipitor, Toprol-XL.  3.  HFpEF -Appears euvolemic -Aldactone, torsemide 40 mg daily   Total encounter time 35 minutes  Greater than 50% was spent in counseling and coordination of care with the patient     Signed, Kate Sable, MD  06/02/2020, 1:08 PM

## 2020-06-03 DIAGNOSIS — J449 Chronic obstructive pulmonary disease, unspecified: Secondary | ICD-10-CM | POA: Diagnosis not present

## 2020-06-03 DIAGNOSIS — I4891 Unspecified atrial fibrillation: Secondary | ICD-10-CM | POA: Diagnosis not present

## 2020-06-03 DIAGNOSIS — G894 Chronic pain syndrome: Secondary | ICD-10-CM | POA: Diagnosis not present

## 2020-06-03 DIAGNOSIS — I509 Heart failure, unspecified: Secondary | ICD-10-CM | POA: Diagnosis not present

## 2020-06-03 LAB — CBC
HCT: 39.6 % (ref 36.0–46.0)
Hemoglobin: 12.7 g/dL (ref 12.0–15.0)
MCH: 29.3 pg (ref 26.0–34.0)
MCHC: 32.1 g/dL (ref 30.0–36.0)
MCV: 91.2 fL (ref 80.0–100.0)
Platelets: 200 10*3/uL (ref 150–400)
RBC: 4.34 MIL/uL (ref 3.87–5.11)
RDW: 14.5 % (ref 11.5–15.5)
WBC: 7.1 10*3/uL (ref 4.0–10.5)
nRBC: 0 % (ref 0.0–0.2)

## 2020-06-03 LAB — HEPARIN LEVEL (UNFRACTIONATED)
Heparin Unfractionated: 1.72 IU/mL — ABNORMAL HIGH (ref 0.30–0.70)
Heparin Unfractionated: 1.54 IU/mL — ABNORMAL HIGH (ref 0.30–0.70)

## 2020-06-03 LAB — APTT
aPTT: 109 seconds — ABNORMAL HIGH (ref 24–36)
aPTT: 54 seconds — ABNORMAL HIGH (ref 24–36)

## 2020-06-03 MED ORDER — ASPIRIN 81 MG PO CHEW
81.0000 mg | CHEWABLE_TABLET | ORAL | Status: AC
  Administered 2020-06-04: 81 mg via ORAL
  Filled 2020-06-03: qty 1

## 2020-06-03 MED ORDER — SODIUM CHLORIDE 0.9 % IV SOLN: 250.0000 mL | INTRAVENOUS | Status: DC | PRN

## 2020-06-03 MED ORDER — SODIUM CHLORIDE 0.9% FLUSH: 3.0000 mL | INTRAVENOUS | Status: DC | PRN

## 2020-06-03 MED ORDER — SODIUM CHLORIDE 0.9% FLUSH
3.0000 mL | Freq: Two times a day (BID) | INTRAVENOUS | Status: DC
  Administered 2020-06-03 – 2020-06-08 (×8): 3 mL via INTRAVENOUS

## 2020-06-03 MED ORDER — AMIODARONE HCL 200 MG PO TABS: 200.0000 mg | ORAL_TABLET | Freq: Every day | ORAL | Status: DC

## 2020-06-03 MED ORDER — POLYETHYLENE GLYCOL 3350 17 G PO PACK
17.0000 g | PACK | Freq: Every day | ORAL | Status: DC
  Administered 2020-06-03 – 2020-06-07 (×4): 17 g via ORAL
  Filled 2020-06-03 (×5): qty 1

## 2020-06-03 MED ORDER — CARVEDILOL 12.5 MG PO TABS
12.5000 mg | ORAL_TABLET | Freq: Two times a day (BID) | ORAL | Status: DC
  Administered 2020-06-04 – 2020-06-08 (×7): 12.5 mg via ORAL
  Filled 2020-06-03 (×8): qty 1

## 2020-06-03 MED ORDER — SENNOSIDES-DOCUSATE SODIUM 8.6-50 MG PO TABS
1.0000 | ORAL_TABLET | Freq: Two times a day (BID) | ORAL | Status: DC
  Administered 2020-06-03 – 2020-06-07 (×8): 1 via ORAL
  Filled 2020-06-03 (×10): qty 1

## 2020-06-03 MED ORDER — HEPARIN (PORCINE) 25000 UT/250ML-% IV SOLN
1550.0000 [IU]/h | INTRAVENOUS | Status: DC
  Administered 2020-06-03: 1550 [IU]/h via INTRAVENOUS
  Filled 2020-06-03: qty 250

## 2020-06-03 MED ORDER — AMIODARONE HCL 200 MG PO TABS
200.0000 mg | ORAL_TABLET | Freq: Two times a day (BID) | ORAL | Status: DC
  Administered 2020-06-04 – 2020-06-08 (×9): 200 mg via ORAL
  Filled 2020-06-03 (×9): qty 1

## 2020-06-03 MED ORDER — HEPARIN BOLUS VIA INFUSION
2500.0000 [IU] | Freq: Once | INTRAVENOUS | Status: AC
  Administered 2020-06-03: 2500 [IU] via INTRAVENOUS
  Filled 2020-06-03: qty 2500

## 2020-06-03 MED ORDER — SODIUM CHLORIDE 0.9 % IV SOLN: INTRAVENOUS | Status: DC

## 2020-06-03 NOTE — Progress Notes (Addendum)
Progress Note  Patient Name: Tina Pacheco Date of Encounter: 06/03/2020  Primary Cardiologist: Ida Rogue, MD  Subjective   Feeling well today sitting up in chair eating lunch. Denies chest discomfort, SOB. Able to feel that her heart rate is more controlled, a little more tired today than yesterday. Awaiting cardiac cath tomorrow.   Inpatient Medications    Scheduled Meds: . [START ON 06/04/2020] amiodarone  200 mg Oral BID   Followed by  . [START ON 06/11/2020] amiodarone  200 mg Oral Daily  . amiodarone  400 mg Oral BID  . amLODipine  10 mg Oral QPM  . aspirin EC  81 mg Oral Daily  . atorvastatin  20 mg Oral QHS  . gabapentin  300 mg Oral QID  . leflunomide  20 mg Oral Daily  . metoprolol succinate  50 mg Oral Daily  . montelukast  10 mg Oral QHS  . oxyCODONE  20 mg Oral BID  . polyethylene glycol  17 g Oral Daily  . predniSONE  5 mg Oral Q breakfast  . senna-docusate  1 tablet Oral BID  . silver sulfADIAZINE   Topical BID  . sodium chloride flush  3 mL Intravenous Q12H  . spironolactone  25 mg Oral Daily  . terazosin  10 mg Oral QHS  . torsemide  40 mg Oral Daily   Continuous Infusions: . sodium chloride    . heparin 1,500 Units/hr (06/03/20 0716)    Vital Signs    Vitals:   06/02/20 1943 06/03/20 0445 06/03/20 0835 06/03/20 1030  BP: (!) 153/53 (!) 145/61 (!) 153/57   Pulse: 64 (!) 52    Resp: 18 15    Temp: 98.6 F (37 C) 98.8 F (37.1 C) 98.6 F (37 C)   TempSrc:  Oral Oral   SpO2: 96% 94%    Weight:  (!) 146.4 kg  (!) 142.6 kg  Height:        Intake/Output Summary (Last 24 hours) at 06/03/2020 1153 Last data filed at 06/03/2020 1000 Gross per 24 hour  Intake 2335.35 ml  Output 1825 ml  Net 510.35 ml   Filed Weights   06/02/20 0419 06/03/20 0445 06/03/20 1030  Weight: (!) 144.6 kg (!) 146.4 kg (!) 142.6 kg    Physical Exam   GEN: Obese, well nourished, in no acute distress.  HEENT: Grossly normal.  Neck: Supple, no JVD, carotid bruits,  or masses. Cardiac: RRR, distant heart sounds, no murmurs, rubs, or gallops. No clubbing, cyanosis. Mild non-pitting bilateral lower extremity edema. Radials 2+, DP/PT 2+ and equal bilaterally.  Respiratory:  Respirations regular and unlabored, clear to auscultation bilaterally. GI: Soft, nontender, nondistended, BS + x 4. MS: no deformity or atrophy. Skin: warm and dry, no rash. Neuro:  Strength and sensation are intact. Psych: AAOx3.  Normal affect.  Labs    Chemistry Recent Labs  Lab 05/31/20 0342 05/31/20 1552 06/01/20 0402 06/02/20 0401  NA 131*  --  136 134*  K 2.6* 3.3* 3.9 3.8  CL 89*  --  95* 92*  CO2 32  --  32 31  GLUCOSE 112*  --  103* 100*  BUN 29*  --  32* 30*  CREATININE 1.30*  --  1.23* 1.10*  CALCIUM 9.3  --  10.4* 10.1  PROT 6.6  --  6.7 6.3*  ALBUMIN 3.5  --  3.6 3.4*  AST 24  --  25 24  ALT 18  --  17 18  ALKPHOS  87  --  93 83  BILITOT 1.1  --  1.2 1.4*  GFRNONAA 44*  --  47* 54*  ANIONGAP 10  --  9 11     Hematology Recent Labs  Lab 06/01/20 0402 06/02/20 0401 06/03/20 0606  WBC 7.7 6.8 7.1  RBC 4.41 4.21 4.34  HGB 13.1 12.4 12.7  HCT 40.2 38.7 39.6  MCV 91.2 91.9 91.2  MCH 29.7 29.5 29.3  MCHC 32.6 32.0 32.1  RDW 14.6 14.6 14.5  PLT 224 218 200    Cardiac Enzymes  Recent Labs  Lab 05/28/20 2031 05/28/20 2209 05/29/20 0040 05/29/20 0243 05/30/20 0754  TROPONINIHS 17 28* 35* 37* 37*      BNP Recent Labs  Lab 05/28/20 2031  BNP 57.6     Lipids  Lab Results  Component Value Date   CHOL 113 08/27/2019   HDL 43.70 08/27/2019   LDLCALC 56 08/27/2019   TRIG 70.0 08/27/2019   CHOLHDL 3 08/27/2019    Radiology    NM Myocar Multi W/Spect W/Wall Motion / EF  Result Date: 06/01/2020  Abnormal pharmacologic myocardial perfusion stress test.  There is a large in size, moderate in severity, completely reversible defect involving the mid anterior, anterolateral, and inferolateral segments, as well as the apical anterior and  lateral segments. Defect is concerning for ischemia, though artifact cannot be excluded (shifting breast attenuation).  Left ventricular systolic function is normal (LVEF 58%).  Attenuation correction CT shows coronary artery calcification, aortic atherosclerosis, and dense mitral annular calcification.  This is an intermediate risk study.  Sensitivity and specificity of the study are degraded by the patient's morbid obesity.     Telemetry    RSR 60's bpm - no significant ectopy - Personally Reviewed  ECG    No new tracing - Personally Reviewed  Cardiac Studies   TTE 05/29/2020 1. Left ventricular ejection fraction, by estimation, is 60 to 65%. The  left ventricle has normal function. The left ventricle has no regional  wall motion abnormalities. There is mild left ventricular hypertrophy.  Left ventricular diastolic parameters  are consistent with Grade I diastolic dysfunction (impaired relaxation).  2. Right ventricular systolic function is normal. The right ventricular  size is normal. Tricuspid regurgitation signal is inadequate for assessing  PA pressure.  3. Left atrial size was moderately dilated.   Patient Profile     70 y.o. female with history of nonobstructive CAD, PAF on Eliquis, HTN, OSA, who is being seen for atrial fibrillation w/RVR, chest pain, and abnormal stress test.   Assessment & Plan    1. PAF: She is currently maintaining sinus rhythm on oral amiodarone, heparin gtt in preparation for cardiac cath tomorrow (holding Eliquis x 48 hours). She was admitted on 4/1 for a fib with RVR and mid-sternal chest pain. Prior to 4/1, she did not note significant symptoms with a fib, unclear as to how often she is out of rhythm. Will resume Eliquis after cath. Continue  blocker, amiodarone.   2. Abnormal myocardial perfusion study/CAD/Demand Ischemia: Hs Troponin minimally elevated with 28 35  37 37 with flat curve. Prior to 4/4, the date of admission, she did not note  significant chest discomfort but at times thought she was having "reflux." Chest discomfort felt on 4/4 was sudden and accompanied by bilateral severe arm weakness. She called EMS and was found to be in a fib w/RVR upon arrival to [facility] ED. 2 day stress test this admission shows mid and apical anterior,  anterolateral, and inferolateral reversible defect. These findings could represent artifact, however due to multiple risk factors including obesity, chest pain, hyperlipidemia, and cardiac cath 10 years ago showed mild to mod mid LAD disease, will plan for [facility] 4/8. On heparin gtt. Patient is agreeable to proceed and will be npo after mn.  The patient understands that risks include but are not limited to stroke (1 in 1000), death (1 in 19), kidney failure [usually temporary] (1 in 500), bleeding (1 in 200), allergic reaction [possibly serious] (1 in 200), and agrees to proceed.  Cont asa, statin,  blocker.  Hold torsemide in AM.  3. Acute on chronic HFpEF: LVEF 60-65%, G1DD, mild LVH, no rwa by echo 05/29/20. Recent significant weight gain prior to admission with adjustment in medications, treated with IV Lasix here but transitioned back to home torsemide 40 mg daily due to bump in creatinine. She reports improvement in SOB but has had fluctuations in daily weights b/t bed weight and standing scales. Suspect that her dry weight is around 310 lb. Continue spironolactone,  blocker.   F/u bmet.  4. Essential hypertension: BP elevated since admission. On spironolactone,  blocker, amlodipine, diuretic. Will transition metoprolol to carvedilol for more BP lowering.  Can consider ARB post-cath if she cont to trend high.  5. Hyperlipidemia: LDL 56 June 2021. Continue statin.   6. Obesity: Not very active at home, states she cannot move herself around easily. Wakes up at 0330 almost daily due to inability to stay asleep. Agree with home sleep study for OSA evaluation.    Signed, Murray Hodgkins, NP  06/03/2020,  11:53 AM    For questions or updates, please contact   Please consult [email or web address] for contact info under Cardiology/STEMI.

## 2020-06-03 NOTE — H&P (View-Only) (Signed)
Progress Note  Patient Name: Tina Pacheco Date of Encounter: 06/03/2020  Primary Cardiologist: Ida Rogue, MD  Subjective   Feeling well today sitting up in chair eating lunch. Denies chest discomfort, SOB. Able to feel that her heart rate is more controlled, a little more tired today than yesterday. Awaiting cardiac cath tomorrow.   Inpatient Medications    Scheduled Meds: . [START ON 06/04/2020] amiodarone  200 mg Oral BID   Followed by  . [START ON 06/11/2020] amiodarone  200 mg Oral Daily  . amiodarone  400 mg Oral BID  . amLODipine  10 mg Oral QPM  . aspirin EC  81 mg Oral Daily  . atorvastatin  20 mg Oral QHS  . gabapentin  300 mg Oral QID  . leflunomide  20 mg Oral Daily  . metoprolol succinate  50 mg Oral Daily  . montelukast  10 mg Oral QHS  . oxyCODONE  20 mg Oral BID  . polyethylene glycol  17 g Oral Daily  . predniSONE  5 mg Oral Q breakfast  . senna-docusate  1 tablet Oral BID  . silver sulfADIAZINE   Topical BID  . sodium chloride flush  3 mL Intravenous Q12H  . spironolactone  25 mg Oral Daily  . terazosin  10 mg Oral QHS  . torsemide  40 mg Oral Daily   Continuous Infusions: . sodium chloride    . heparin 1,500 Units/hr (06/03/20 0716)    Vital Signs    Vitals:   06/02/20 1943 06/03/20 0445 06/03/20 0835 06/03/20 1030  BP: (!) 153/53 (!) 145/61 (!) 153/57   Pulse: 64 (!) 52    Resp: 18 15    Temp: 98.6 F (37 C) 98.8 F (37.1 C) 98.6 F (37 C)   TempSrc:  Oral Oral   SpO2: 96% 94%    Weight:  (!) 146.4 kg  (!) 142.6 kg  Height:        Intake/Output Summary (Last 24 hours) at 06/03/2020 1153 Last data filed at 06/03/2020 1000 Gross per 24 hour  Intake 2335.35 ml  Output 1825 ml  Net 510.35 ml   Filed Weights   06/02/20 0419 06/03/20 0445 06/03/20 1030  Weight: (!) 144.6 kg (!) 146.4 kg (!) 142.6 kg    Physical Exam   GEN: Obese, well nourished, in no acute distress.  HEENT: Grossly normal.  Neck: Supple, no JVD, carotid bruits,  or masses. Cardiac: RRR, distant heart sounds, no murmurs, rubs, or gallops. No clubbing, cyanosis. Mild non-pitting bilateral lower extremity edema. Radials 2+, DP/PT 2+ and equal bilaterally.  Respiratory:  Respirations regular and unlabored, clear to auscultation bilaterally. GI: Soft, nontender, nondistended, BS + x 4. MS: no deformity or atrophy. Skin: warm and dry, no rash. Neuro:  Strength and sensation are intact. Psych: AAOx3.  Normal affect.  Labs    Chemistry Recent Labs  Lab 05/31/20 0342 05/31/20 1552 06/01/20 0402 06/02/20 0401  NA 131*  --  136 134*  K 2.6* 3.3* 3.9 3.8  CL 89*  --  95* 92*  CO2 32  --  32 31  GLUCOSE 112*  --  103* 100*  BUN 29*  --  32* 30*  CREATININE 1.30*  --  1.23* 1.10*  CALCIUM 9.3  --  10.4* 10.1  PROT 6.6  --  6.7 6.3*  ALBUMIN 3.5  --  3.6 3.4*  AST 24  --  25 24  ALT 18  --  17 18  ALKPHOS  87  --  93 83  BILITOT 1.1  --  1.2 1.4*  GFRNONAA 44*  --  47* 54*  ANIONGAP 10  --  9 11     Hematology Recent Labs  Lab 06/01/20 0402 06/02/20 0401 06/03/20 0606  WBC 7.7 6.8 7.1  RBC 4.41 4.21 4.34  HGB 13.1 12.4 12.7  HCT 40.2 38.7 39.6  MCV 91.2 91.9 91.2  MCH 29.7 29.5 29.3  MCHC 32.6 32.0 32.1  RDW 14.6 14.6 14.5  PLT 224 218 200    Cardiac Enzymes  Recent Labs  Lab 05/28/20 2031 05/28/20 2209 05/29/20 0040 05/29/20 0243 05/30/20 0754  TROPONINIHS 17 28* 35* 37* 37*      BNP Recent Labs  Lab 05/28/20 2031  BNP 57.6     Lipids  Lab Results  Component Value Date   CHOL 113 08/27/2019   HDL 43.70 08/27/2019   LDLCALC 56 08/27/2019   TRIG 70.0 08/27/2019   CHOLHDL 3 08/27/2019    Radiology    NM Myocar Multi W/Spect W/Wall Motion / EF  Result Date: 06/01/2020  Abnormal pharmacologic myocardial perfusion stress test.  There is a large in size, moderate in severity, completely reversible defect involving the mid anterior, anterolateral, and inferolateral segments, as well as the apical anterior and  lateral segments. Defect is concerning for ischemia, though artifact cannot be excluded (shifting breast attenuation).  Left ventricular systolic function is normal (LVEF 58%).  Attenuation correction CT shows coronary artery calcification, aortic atherosclerosis, and dense mitral annular calcification.  This is an intermediate risk study.  Sensitivity and specificity of the study are degraded by the patient's morbid obesity.     Telemetry    RSR 60's bpm - no significant ectopy - Personally Reviewed  ECG    No new tracing - Personally Reviewed  Cardiac Studies   TTE 05/29/2020 1. Left ventricular ejection fraction, by estimation, is 60 to 65%. The  left ventricle has normal function. The left ventricle has no regional  wall motion abnormalities. There is mild left ventricular hypertrophy.  Left ventricular diastolic parameters  are consistent with Grade I diastolic dysfunction (impaired relaxation).  2. Right ventricular systolic function is normal. The right ventricular  size is normal. Tricuspid regurgitation signal is inadequate for assessing  PA pressure.  3. Left atrial size was moderately dilated.   Patient Profile     70 y.o. female with history of nonobstructive CAD, PAF on Eliquis, HTN, OSA, who is being seen for atrial fibrillation w/RVR, chest pain, and abnormal stress test.   Assessment & Plan    1. PAF: She is currently maintaining sinus rhythm on oral amiodarone, heparin gtt in preparation for cardiac cath tomorrow (holding Eliquis x 48 hours). She was admitted on 4/1 for a fib with RVR and mid-sternal chest pain. Prior to 4/1, she did not note significant symptoms with a fib, unclear as to how often she is out of rhythm. Will resume Eliquis after cath. Continue  blocker, amiodarone.   2. Abnormal myocardial perfusion study/CAD/Demand Ischemia: Hs Troponin minimally elevated with 28 35  37 37 with flat curve. Prior to 4/4, the date of admission, she did not note  significant chest discomfort but at times thought she was having "reflux." Chest discomfort felt on 4/4 was sudden and accompanied by bilateral severe arm weakness. She called EMS and was found to be in a fib w/RVR upon arrival to [facility] ED. 2 day stress test this admission shows mid and apical anterior,  anterolateral, and inferolateral reversible defect. These findings could represent artifact, however due to multiple risk factors including obesity, chest pain, hyperlipidemia, and cardiac cath 10 years ago showed mild to mod mid LAD disease, will plan for [facility] 4/8. On heparin gtt. Patient is agreeable to proceed and will be npo after mn.  The patient understands that risks include but are not limited to stroke (1 in 1000), death (1 in 73), kidney failure [usually temporary] (1 in 500), bleeding (1 in 200), allergic reaction [possibly serious] (1 in 200), and agrees to proceed.  Cont asa, statin,  blocker.  Hold torsemide in AM.  3. Acute on chronic HFpEF: LVEF 60-65%, G1DD, mild LVH, no rwa by echo 05/29/20. Recent significant weight gain prior to admission with adjustment in medications, treated with IV Lasix here but transitioned back to home torsemide 40 mg daily due to bump in creatinine. She reports improvement in SOB but has had fluctuations in daily weights b/t bed weight and standing scales. Suspect that her dry weight is around 310 lb. Continue spironolactone,  blocker.   F/u bmet.  4. Essential hypertension: BP elevated since admission. On spironolactone,  blocker, amlodipine, diuretic. Will transition metoprolol to carvedilol for more BP lowering.  Can consider ARB post-cath if she cont to trend high.  5. Hyperlipidemia: LDL 56 June 2021. Continue statin.   6. Obesity: Not very active at home, states she cannot move herself around easily. Wakes up at 0330 almost daily due to inability to stay asleep. Agree with home sleep study for OSA evaluation.    Signed, Murray Hodgkins, NP  06/03/2020,  11:53 AM    For questions or updates, please contact   Please consult [email or web address] for contact info under Cardiology/STEMI.

## 2020-06-03 NOTE — Evaluation (Signed)
Physical Therapy Evaluation Patient Details Name: Tina Pacheco MRN: [id number] DOB: [birth year] Today's Date: 06/03/2020   History of Present Illness  Pt admitted for Afib with complaints of SOB symptoms. History includes Afib, CHF, COPD, RA, and HTN. Pending cardiac cath tomorrow.  Clinical Impression  Pt is a pleasant 70 year old female who was admitted for Afib. Pt performs bed mobility/transfers with mod I and ambulation with min assist and bari RW. Pt demonstrates deficits with decreased endurance/mobility/obesity/pain. Heavy reliance on B UE on RW as B LE are extremely weak. Needed seated rest break with exertion. Recommend chair follow for all extended ambulation. High risk for falls. Would benefit from skilled PT to address above deficits and promote optimal return to PLOF; recommend transition to STR upon discharge from acute hospitalization.     Follow Up Recommendations SNF    Equipment Recommendations  None recommended by PT    Recommendations for Other Services       Precautions / Restrictions Precautions Precautions: Fall Restrictions Weight Bearing Restrictions: No      Mobility  Bed Mobility Overal bed mobility: Modified Independent             General bed mobility comments: safe technique with exertional activity. Once seated at EOB, upright posture noted    Transfers Overall transfer level: Modified independent Equipment used: Rolling walker (2 wheeled)             General transfer comment: needs to push from seated surface. ONce standing, upright posture noted  Ambulation/Gait Ambulation/Gait assistance: Min assist Gait Distance (Feet): 40 Feet Assistive device: Rolling walker (2 wheeled) Gait Pattern/deviations: Step-through pattern     General Gait Details: ambulated in hallway with RW. Pt fatigues quickly, needing seated rest break 1/2 way. Very slow gait speed with reciprocal gait pattern. Multiple times needs to stop and rest on  railing. Reports she feels her legs are buckling.  Stairs            Wheelchair Mobility    Modified Rankin (Stroke Patients Only)       Balance Overall balance assessment: History of Falls;Needs assistance Sitting-balance support: No upper extremity supported Sitting balance-Leahy Scale: Good     Standing balance support: Bilateral upper extremity supported Standing balance-Leahy Scale: Fair                               Pertinent Vitals/Pain Pain Assessment: Faces Faces Pain Scale: Hurts little more Pain Location: B shoulders Pain Descriptors / Indicators: Aching Pain Intervention(s): Limited activity within patient's tolerance    Home Living Family/patient expects to be discharged to:: Private residence Living Arrangements: Spouse/significant other Available Help at Discharge: Family Type of Home: House Home Access: Ramped entrance     Home Layout: Two level;Able to live on main level with bedroom/bathroom Home Equipment: Walker - 4 wheels;Cane - single point;Bedside commode (motorized chair; adapted tub)      Prior Function Level of Independence: Independent with assistive device(s)         Comments: Has recently been utilizing rollator and less able to perform ADLs. Reports she is unable to prepare a meal right now. Fatigues with quick distance     Hand Dominance        Extremity/Trunk Assessment   Upper Extremity Assessment Upper Extremity Assessment: Generalized weakness (B UE grossly 3+/5)    Lower Extremity Assessment Lower Extremity Assessment: Generalized weakness (B LE grossly 3+/5)  Communication   Communication: No difficulties  Cognition Arousal/Alertness: Awake/alert Behavior During Therapy: WFL for tasks assessed/performed Overall Cognitive Status: Within Functional Limits for tasks assessed                                        General Comments      Exercises Other Exercises Other  Exercises: able to ambulated outside of door to stand on scale. CNA able to document. Other Exercises: encouraged to remain seated in recliner at end of session   Assessment/Plan    PT Assessment Patient needs continued PT services  PT Problem List Decreased strength;Decreased activity tolerance;Decreased balance;Decreased mobility;Cardiopulmonary status limiting activity;Obesity       PT Treatment Interventions Gait training;DME instruction;Therapeutic exercise;Balance training    PT Goals (Current goals can be found in the Care Plan section)  Acute Rehab PT Goals Patient Stated Goal: to have less falls PT Goal Formulation: With patient Time For Goal Achievement: 06/17/20 Potential to Achieve Goals: Good    Frequency Min 2X/week   Barriers to discharge        Co-evaluation               AM-PAC PT "6 Clicks" Mobility  Outcome Measure Help needed turning from your back to your side while in a flat bed without using bedrails?: A Little Help needed moving from lying on your back to sitting on the side of a flat bed without using bedrails?: A Little Help needed moving to and from a bed to a chair (including a wheelchair)?: A Little Help needed standing up from a chair using your arms (e.g., wheelchair or bedside chair)?: A Little Help needed to walk in hospital room?: A Little Help needed climbing 3-5 steps with a railing? : A Lot 6 Click Score: 17    End of Session Equipment Utilized During Treatment: Gait belt Activity Tolerance: Patient limited by fatigue Patient left: in chair Nurse Communication: Mobility status PT Visit Diagnosis: Muscle weakness (generalized) (M62.81);Unsteadiness on feet (R26.81);Repeated falls (R29.6);Difficulty in walking, not elsewhere classified (R26.2)    Time: 4462-8638 PT Time Calculation (min) (ACUTE ONLY): 33 min   Charges:   PT Evaluation $PT Eval Low Complexity: 1 Low PT Treatments $Gait Training: 8-22 mins         Greggory Stallion, PT, DPT [phone]   Wilian Kwong 06/03/2020, 12:58 PM

## 2020-06-03 NOTE — Plan of Care (Signed)

## 2020-06-03 NOTE — Progress Notes (Signed)
[place] [facility] [place] NAME: [name]    MR#:  [id number]  DATE OF BIRTH:  04-13-1950  SUBJECTIVE:   Denies any complaints. . IV heparin drip. Anticipated cardiac cath on Friday REVIEW OF SYSTEMS:   Review of Systems  Constitutional: Negative for chills, fever and weight loss.  HENT: Negative for ear discharge, ear pain and nosebleeds.   Eyes: Negative for blurred vision, pain and discharge.  Respiratory: Negative for sputum production, shortness of breath, wheezing and stridor.   Cardiovascular: Negative for chest pain, palpitations, orthopnea and PND.  Gastrointestinal: Negative for abdominal pain, diarrhea, nausea and vomiting.  Genitourinary: Negative for frequency and urgency.  Musculoskeletal: Negative for back pain and joint pain.  Neurological: Positive for weakness. Negative for sensory change, speech change and focal weakness.  Psychiatric/Behavioral: Negative for depression and hallucinations. The patient is not nervous/anxious.    Tolerating Diet:yes Tolerating PT: rec SNF  DRUG ALLERGIES:   Allergies  Allergen Reactions  . Fluoxetine     Headache, shaking, sleep issues  . Codeine     Nausea and vomiting/only when taking too much    VITALS:  Blood pressure (!) 153/57, pulse (!) 52, temperature 98.6 F (37 C), temperature source Oral, resp. rate 15, height 5\' 3"  (1.6 m), weight (!) 142.6 kg, SpO2 94 %.  PHYSICAL EXAMINATION:   Physical Exam  GENERAL:  70 y.o.-year-old patient lying in the bed with no acute distress.  Morbid obesity LUNGS: Normal breath sounds bilaterally, no wheezing, rales, rhonchi. No use of accessory muscles of respiration.  CARDIOVASCULAR: S1, S2 normal. No murmurs, rubs, or gallops.  ABDOMEN: Soft, nontender, nondistended. Bowel sounds present. No organomegaly or mass.  EXTREMITIES: No cyanosis, clubbing or edema b/l.    NEUROLOGIC: Cranial nerves II through XII are intact. No focal Motor  or sensory deficits b/l.   PSYCHIATRIC:  patient is alert and oriented x 3.  SKIN: No obvious rash, lesion, or ulcer.   LABORATORY PANEL:  CBC Recent Labs  Lab 06/03/20 0606  WBC 7.1  HGB 12.7  HCT 39.6  PLT 200    Chemistries  Recent Labs  Lab 06/02/20 0401  NA 134*  K 3.8  CL 92*  CO2 31  GLUCOSE 100*  BUN 30*  CREATININE 1.10*  CALCIUM 10.1  MG 1.9  AST 24  ALT 18  ALKPHOS 83  BILITOT 1.4*   Cardiac Enzymes No results for input(s): TROPONINI in the last 168 hours. RADIOLOGY:  No results found. ASSESSMENT AND PLAN:  70 year old female with history of paroxysmal A. fib on Eliquis, diastolic CHF, COPD, nonobstructive CAD, RA, HTN, HLD, chronic pain, obesity comes into the hospital with sudden onset of chest pain shortness of breath. In the ER she was found to be fluid overloaded, chest x-ray showed mild pulmonary edema and she was in A. fib with RVR.  She did not respond to diltiazem and was placed on amiodarone infusion  Acute hypoxic respiratory failure due to acute pulmonary edema in the setting of acute on chronic diastolic heart failure --she is compliant with all her medications however it seems that at times she is struggling with her diet and also with her fluid intake.  Suspect this may be part of the problem --She has been started on IV Lasix, she is responding well, however developed mild AKI this morning. Net negative 5.7 L. Hold lasix, appreciate cards input --echo yesterday showed normal EF, grade 1 DD --resumed torsemide by  cardiology  Paroxysmal A. fib, now with RVR -Patient had been on amiodarone infusion given the fact that she did not respond to IV diltiazem.  -- She is chronically anticoagulated with Eliquis (onhold) -- IV heparin drip for anticoagulation -- patient currently on oral amiodarone 400 mg BID and metoprolol--HR much improved  Hypokalemia -  replete potassium  AKIsuspect due to over diuresis with IV Lasix  -- currently on oral  torsemide --creat 1.1 today  Elevated troponin -Overall flat, not in a pattern consistent with ACS, she has intermittent chest pains likely related to RVR.   -- two days stress test shows mid and apical anterior,  anterolateral and inflow lateral reversible defect. Cardiology plans to do heart catheterization on Friday once eliquis effect wears off  COPD -No wheezing, this appears stable  Essential hypertension -Continue home medications  Hyperlipidemia-continue atorvastatin  Rheumatoid arthritis, chronic pain-continue home medications  Morbid obesity-- complicates course and prognosis  Physical therapy recommends rehab. TOC consulted  Procedures: Family communication : husband in the room Consults : cardiology CODE STATUS: full DVT Prophylaxis : heparin drip Level of care: Progressive Cardiac Status is: Inpatient  Remains inpatient appropriate because:Inpatient level of care appropriate due to severity of illness   Dispo: The patient is from: Home              Anticipated d/c is to: ?SNF              Patient currently is not medically stable to d/c.   Difficult to place patient No   Abnormal myoview stress test. Cardiac cath plan by cardiology on Friday, April 8th     TOTAL TIME TAKING CARE OF THIS PATIENT: 30 minutes.  >50% time spent on counselling and coordination of care  Note: This dictation was prepared with Dragon dictation along with smaller phrase technology. Any transcriptional errors that result from this process are unintentional.  Fritzi Mandes M.D    [facility]   CC: Primary care physician; Venia Carbon, MDPatient ID: Tina Pacheco, female   DOB: 1950/07/06, 70 y.o.   MRN: [id number]

## 2020-06-03 NOTE — Care Management Important Message (Signed)
Important Message  Patient Details  Name: Tina Pacheco MRN: [id number] Date of Birth: January 09, 1951   Medicare Important Message Given:  Yes     Dannette Barbara 06/03/2020, 12:54 PM

## 2020-06-03 NOTE — Consult Note (Signed)
Tina Pacheco for Tina Pacheco >> Hep gtt Indication: atrial fibrillation (cath to w/u abn'l stress test on Friday 4/8)  Patient Measurements: Heparin Dosing Weight: 89.3 kg  Labs: Recent Labs    06/01/20 0402 06/01/20 2026 06/02/20 0401 06/02/20 1429 06/02/20 2140 06/03/20 0606 06/03/20 1525  HGB 13.1  --  12.4  --   --  12.7  --   HCT 40.2  --  38.7  --   --  39.6  --   PLT 224  --  218  --   --  200  --   APTT  --   --  60*   < > 73* 109* 54*  HEPARINUNFRC  --    < > >2.20*  --   --  1.72* 1.54*  CREATININE 1.23*  --  1.10*  --   --   --   --    < > = values in this interval not displayed.    Estimated Creatinine Clearance: 66.5 mL/min (A) (by C-G formula based on SCr of 1.1 mg/dL (H)).   Medications:  No AC/APT related drug allergies Inpatient: Tina Pacheco 5mg  BID (last dose 4/5 0900), asa 81mg  QD,  PTA: Tina Pacheco 5mg  BID  Heparin Dosing Weight: 89.3 kg  Assessment: 70 yo F w/ h/o parAFib (on Tina Pacheco), diaCHF, COPD, nonobs CAD, RA, HTN, HLD, chronic pain, obesity comes into the hospital with sudden onset of chest pain shortness of breath.  In the ER she was found to be fluid overloaded despite recent outpatient up-titration of diuretic, chest x-ray showed mild pulmonary edema and she was noted to be in A. fib with RVR. On day 4 of admission (4/5) pt had abnormal stress test and planning heart cath on Friday to further assess. Pharmacy consulted for the transition of Tina Pacheco to Heparin gtt during interim.    BL labs: aPTT (32s), INR 1.2, H/H/plts - WNL  Goal of Therapy:  Heparin level 0.3-0.7 units/ml Anti-Xa level 0.6-1 units/ml 4hrs after LMWH dose given Monitor platelets by anticoagulation protocol: Yes  -aPTT range 66-102  4/6 0401 aPTT 60 subtherapeutic, HL remains >2.2, 1300 bolus, increase to 1450 units/hr 4/6 1429 aPTT 54 subtherapeutic  4/6 2140 aPTT 73, therapeutic x 1 4/7 0606 aPTT 109, supratherapeutic, HL 1.72 4/7 1525 aPTT 54,  subtherapeutic, HL 1.54  Plan --aPTT is subtherapeutic. HL remains supratherapeutic. Will give heparin 2500 unit bolus x 1 and increase heparin infusion to 1550 units/hr as patient was previously supratherapeutic on 1600 units/hr --Re-check aPTT 8 hours after rate change --Daily CBC per protocol while on IV heparin  Tina Pacheco 06/03/2020 6:48 PM

## 2020-06-03 NOTE — Consult Note (Signed)
[place] for Eliquis >> Hep gtt Indication: atrial fibrillation (cath to w/u abn'l stress test on Friday 4/8)  Allergies  Allergen Reactions  . Fluoxetine     Headache, shaking, sleep issues  . Codeine     Nausea and vomiting/only when taking too much    Patient Measurements: Height: 5\' 3"  (160 cm) Weight: (!) 146.4 kg (322 lb 12.1 oz) IBW/kg (Calculated) : 52.4 Heparin Dosing Weight: 89.3 kg  Vital Signs: Temp: 98.8 F (37.1 C) (04/07 0445) Temp Source: Oral (04/07 0445) BP: 145/61 (04/07 0445) Pulse Rate: 52 (04/07 0445)  Labs: Recent Labs    06/01/20 0402 06/01/20 0402 06/01/20 2026 06/02/20 0401 06/02/20 1429 06/02/20 2140 06/03/20 0606  HGB 13.1  --   --  12.4  --   --  12.7  HCT 40.2  --   --  38.7  --   --  39.6  PLT 224  --   --  218  --   --  200  APTT  --    < >  --  60* 54* 73* 109*  HEPARINUNFRC  --   --  >2.20* >2.20*  --   --   --   CREATININE 1.23*  --   --  1.10*  --   --   --    < > = values in this interval not displayed.    Estimated Creatinine Clearance: 67.6 mL/min (A) (by C-G formula based on SCr of 1.1 mg/dL (H)).   Medications:  No AC/APT related drug allergies Inpatient: eliquis 5mg  BID (last dose 4/5 0900), asa 81mg  QD,  PTA: eliquis 5mg  BID  Heparin Dosing Weight: 89.3 kg  Assessment: 70 yo F w/ h/o parAFib (on Eliquis), diaCHF, COPD, nonobs CAD, RA, HTN, HLD, chronic pain, obesity comes into the hospital with sudden onset of chest pain shortness of breath.  In the ER she was found to be fluid overloaded despite recent outpatient up-titration of diuretic, chest x-ray showed mild pulmonary edema and she was noted to be in A. fib with RVR. On day 4 of admission (4/5) pt had abnormal stress test and planning heart cath on Friday to further assess. Pharmacy consulted for the transition of Eliquis to Heparin gtt during interim.    BL labs: aPTT (32s), INR 1.2, H/H/plts - WNL  Goal of  Therapy:  Heparin level 0.3-0.7 units/ml Anti-Xa level 0.6-1 units/ml 4hrs after LMWH dose given Monitor platelets by anticoagulation protocol: Yes  -aPTT range 66-102  4/6 0401 aPTT 60 subtherapeutic, HL remains >2.2, 1300 bolus, increase to 1450 units/hr 4/6 1429 aPTT 54 subtherapeutic  4/6 2140 aPTT 73, therapeutic x 1 4/7 0606 aPTT 109, slightly supratherapeutic, HL 1.72    Plan  Will decrease heparin infusion to 1500 units/hr  Re-check aPTT in 8 hrs after rate change.  HL daily until it correlates w/ aPTT, then follow HL.  Continue to monitor H&H and platelets.  Renda Rolls, PharmD, [facility] 06/03/2020 6:35 AM

## 2020-06-03 NOTE — Evaluation (Signed)
Occupational Therapy Evaluation Patient Details Name: Tina Pacheco MRN: [id number] DOB: 1950/08/30 Today's Date: 06/03/2020    History of Present Illness Pt admitted for Afib with complaints of SOB symptoms. History includes Afib, CHF, COPD, RA, and HTN. Pending cardiac cath tomorrow.   Clinical Impression   Pt seen for OT evaluation this date. Upon arrival to room, pt seated upright in chair. Pt agreeable to OT eval/tx. Prior to admission, pt was living with spouse in a 2-story home and was MOD-I with rollator for functional mobility and ADLs, however reports becoming easily fatigued with short distances and requiring increased effort/use of compensatory strategies to perform ADLs. Pt endorses a history of falls.   Pt currently requires MIN GUARD for seated LB dressing and SUPERVISION for standing grooming tasks with unilateral UE support on RW/sink d/t current functional impairments (See OT Problem List below). Pt would benefit from additional skilled OT services to maximize independence with ADLs/functional mobility and facilitate the implementation of energy conservation strategies into daily routines. Upon discharge, recommend SNF.     Follow Up Recommendations  SNF    Equipment Recommendations  Other (comment) (defer to next venue of care)       Precautions / Restrictions Precautions Precautions: Fall Restrictions Weight Bearing Restrictions: No      Mobility Bed Mobility Overal bed mobility: Modified Independent             General bed mobility comments: In recliner upon arrival    Transfers Overall transfer level: Modified independent Equipment used: Rolling walker (2 wheeled)             General transfer comment: needs to push from seated surface. Once standing, upright posture noted    Balance Overall balance assessment: History of Falls;Needs assistance Sitting-balance support: No upper extremity supported Sitting balance-Leahy Scale: Good Sitting  balance - Comments: Good sitting balance during LB dressing, although requires increase time d/t decreased sitting balance   Standing balance support: Single extremity supported;During functional activity Standing balance-Leahy Scale: Fair Standing balance comment: Requires unilateral UE support during standing grooming tasks                           ADL either performed or assessed with clinical judgement   ADL Overall ADL's : Needs assistance/impaired     Grooming: Oral care;Applying deodorant;Supervision/safety;Standing               Lower Body Dressing: Min guard;Sitting/lateral leans Lower Body Dressing Details (indicate cue type and reason): to don/doff socks             Functional mobility during ADLs: Min guard;Rolling walker       Vision Patient Visual Report: No change from baseline              Pertinent Vitals/Pain Pain Assessment: Faces Faces Pain Scale: Hurts little more Pain Location: Back Pain Descriptors / Indicators: Aching Pain Intervention(s): Limited activity within patient's tolerance;Monitored during session;Repositioned     Hand Dominance Right   Extremity/Trunk Assessment Upper Extremity Assessment Upper Extremity Assessment: Generalized weakness (B UE grossly 3+/5)   Lower Extremity Assessment Lower Extremity Assessment: Generalized weakness (B UE grossly 3+/5)       Communication Communication Communication: No difficulties   Cognition Arousal/Alertness: Awake/alert Behavior During Therapy: WFL for tasks assessed/performed Overall Cognitive Status: Within Functional Limits for tasks assessed  General Comments  SpO2 desat 88% following LB dressing, able to recover to >93% following cessation of activity            Home Living Family/patient expects to be discharged to:: Private residence Living Arrangements: Spouse/significant other Available Help at  Discharge: Family Type of Home: House Home Access: [place]: Two level;Able to live on main level with bedroom/bathroom     Bathroom Shower/Tub: Walk-in shower         Home Equipment: Environmental consultant - 4 wheels;Cane - single point;Bedside commode (motorized chair; adapted tub)          Prior Functioning/Environment Level of Independence: Independent with assistive device(s)        Comments: Has recently been utilizing rollator and requires increased effort/compensatory strategies to perform ADLs. Reports she is unable to prepare a meal right now. Fatigues with quick distance        OT Problem List: Decreased strength;Decreased activity tolerance;Impaired balance (sitting and/or standing);Cardiopulmonary status limiting activity      OT Treatment/Interventions: Self-care/ADL training;Therapeutic exercise;Energy conservation;DME and/or AE instruction;Therapeutic activities;Patient/family education;Balance training    OT Goals(Current goals can be found in the care plan section) Acute Rehab OT Goals Patient Stated Goal: to go home OT Goal Formulation: With patient Time For Goal Achievement: 06/17/20 Potential to Achieve Goals: Fair ADL Goals Pt Will Perform Grooming: with modified independence;standing Pt Will Transfer to Toilet: with modified independence;ambulating;regular height toilet Pt Will Perform Toileting - Clothing Manipulation and hygiene: with supervision;sitting/lateral leans  OT Frequency: Min 1X/week    AM-PAC OT "6 Clicks" Daily Activity     Outcome Measure Help from another person eating meals?: None Help from another person taking care of personal grooming?: A Little Help from another person toileting, which includes using toliet, bedpan, or urinal?: A Lot Help from another person bathing (including washing, rinsing, drying)?: A Lot Help from another person to put on and taking off regular upper body clothing?: A Little Help from another  person to put on and taking off regular lower body clothing?: A Little 6 Click Score: 17   End of Session Equipment Utilized During Treatment: Gait belt;Rolling walker Nurse Communication: Mobility status  Activity Tolerance: Patient tolerated treatment well Patient left: in chair;with call bell/phone within reach;with chair alarm set  OT Visit Diagnosis: Unsteadiness on feet (R26.81);Muscle weakness (generalized) (M62.81)                Time: 9381-0175 OT Time Calculation (min): 40 min Charges:  OT General Charges $OT Visit: 1 Visit OT Evaluation $OT Eval Moderate Complexity: 1 Mod OT Treatments $Self Care/Home Management : 23-37 mins  Fredirick Maudlin, OTR/L [place]

## 2020-06-04 ENCOUNTER — Encounter
Admission: EM | Disposition: A | Discharge status: Skilled Nursing Facility | Payer: Self-pay | Source: Home / Self Care | Attending: Internal Medicine

## 2020-06-04 DIAGNOSIS — G894 Chronic pain syndrome: Secondary | ICD-10-CM | POA: Diagnosis not present

## 2020-06-04 DIAGNOSIS — I4891 Unspecified atrial fibrillation: Secondary | ICD-10-CM | POA: Diagnosis not present

## 2020-06-04 DIAGNOSIS — I2510 Atherosclerotic heart disease of native coronary artery without angina pectoris: Secondary | ICD-10-CM

## 2020-06-04 DIAGNOSIS — I509 Heart failure, unspecified: Secondary | ICD-10-CM | POA: Diagnosis not present

## 2020-06-04 DIAGNOSIS — R9439 Abnormal result of other cardiovascular function study: Secondary | ICD-10-CM

## 2020-06-04 DIAGNOSIS — J449 Chronic obstructive pulmonary disease, unspecified: Secondary | ICD-10-CM | POA: Diagnosis not present

## 2020-06-04 HISTORY — PX: LEFT HEART CATH AND CORONARY ANGIOGRAPHY: CATH118249

## 2020-06-04 LAB — CBC
HCT: 39 % (ref 36.0–46.0)
Hemoglobin: 12.8 g/dL (ref 12.0–15.0)
MCH: 29.7 pg (ref 26.0–34.0)
MCHC: 32.8 g/dL (ref 30.0–36.0)
MCV: 90.5 fL (ref 80.0–100.0)
Platelets: 211 10*3/uL (ref 150–400)
RBC: 4.31 MIL/uL (ref 3.87–5.11)
RDW: 14.4 % (ref 11.5–15.5)
WBC: 9 10*3/uL (ref 4.0–10.5)
nRBC: 0 % (ref 0.0–0.2)

## 2020-06-04 LAB — APTT: aPTT: 119 seconds — ABNORMAL HIGH (ref 24–36)

## 2020-06-04 SURGERY — LEFT HEART CATH AND CORONARY ANGIOGRAPHY
Anesthesia: Moderate Sedation

## 2020-06-04 MED ORDER — FENTANYL CITRATE (PF) 100 MCG/2ML IJ SOLN
INTRAMUSCULAR | Status: DC | PRN
  Administered 2020-06-04: 50 ug via INTRAVENOUS

## 2020-06-04 MED ORDER — SODIUM CHLORIDE 0.9% FLUSH: 3.0000 mL | INTRAVENOUS | Status: DC | PRN

## 2020-06-04 MED ORDER — VERAPAMIL HCL 2.5 MG/ML IV SOLN
INTRAVENOUS | Status: AC
  Filled 2020-06-04: qty 2

## 2020-06-04 MED ORDER — HEPARIN SODIUM (PORCINE) 1000 UNIT/ML IJ SOLN
INTRAMUSCULAR | Status: DC | PRN
Start: 2020-06-04 — End: 2020-06-04
  Administered 2020-06-04: 6000 [IU] via INTRAVENOUS

## 2020-06-04 MED ORDER — LIDOCAINE HCL (PF) 1 % IJ SOLN
INTRAMUSCULAR | Status: DC | PRN
  Administered 2020-06-04: 2 mL

## 2020-06-04 MED ORDER — MIDAZOLAM HCL 2 MG/2ML IJ SOLN
INTRAMUSCULAR | Status: AC
  Filled 2020-06-04: qty 2

## 2020-06-04 MED ORDER — SODIUM CHLORIDE 0.9% FLUSH
3.0000 mL | Freq: Two times a day (BID) | INTRAVENOUS | Status: DC
  Administered 2020-06-05 – 2020-06-06 (×3): 3 mL via INTRAVENOUS

## 2020-06-04 MED ORDER — VERAPAMIL HCL 2.5 MG/ML IV SOLN
INTRAVENOUS | Status: DC | PRN
  Administered 2020-06-04: 2.5 mg via INTRA_ARTERIAL

## 2020-06-04 MED ORDER — IOHEXOL 300 MG/ML  SOLN
INTRAMUSCULAR | Status: DC | PRN
  Administered 2020-06-04: 42 mL

## 2020-06-04 MED ORDER — APIXABAN 5 MG PO TABS
5.0000 mg | ORAL_TABLET | Freq: Two times a day (BID) | ORAL | Status: DC
  Administered 2020-06-05 – 2020-06-08 (×7): 5 mg via ORAL
  Filled 2020-06-04 (×8): qty 1

## 2020-06-04 MED ORDER — HEPARIN (PORCINE) 25000 UT/250ML-% IV SOLN
1450.0000 [IU]/h | INTRAVENOUS | Status: DC
  Administered 2020-06-04: 1450 [IU]/h via INTRAVENOUS

## 2020-06-04 MED ORDER — LIDOCAINE HCL (PF) 1 % IJ SOLN
INTRAMUSCULAR | Status: AC
  Filled 2020-06-04: qty 30

## 2020-06-04 MED ORDER — SODIUM CHLORIDE 0.9 % IV SOLN: 250.0000 mL | INTRAVENOUS | Status: DC | PRN

## 2020-06-04 MED ORDER — HEPARIN SODIUM (PORCINE) 1000 UNIT/ML IJ SOLN
INTRAMUSCULAR | Status: AC
  Filled 2020-06-04: qty 1

## 2020-06-04 MED ORDER — FENTANYL CITRATE (PF) 100 MCG/2ML IJ SOLN
INTRAMUSCULAR | Status: AC
  Filled 2020-06-04: qty 2

## 2020-06-04 MED ORDER — HEPARIN (PORCINE) IN NACL 2000-0.9 UNIT/L-% IV SOLN
INTRAVENOUS | Status: DC | PRN
  Administered 2020-06-04: 1000 mL

## 2020-06-04 MED ORDER — MIDAZOLAM HCL 2 MG/2ML IJ SOLN
INTRAMUSCULAR | Status: DC | PRN
  Administered 2020-06-04: 1 mg via INTRAVENOUS

## 2020-06-04 SURGICAL SUPPLY — 12 items
CATH INFINITI 5FR JK (CATHETERS) ×2 IMPLANT
COVER EZ STRL 42X30 (DRAPES) ×2 IMPLANT
DEVICE RAD TR BAND REGULAR (VASCULAR PRODUCTS) ×2 IMPLANT
DRAPE BRACHIAL (DRAPES) ×2 IMPLANT
GLIDESHEATH SLEND SS 6F .021 (SHEATH) ×2 IMPLANT
GUIDEWIRE INQWIRE 1.5J.035X260 (WIRE) ×1 IMPLANT
INQWIRE 1.5J .035X260CM (WIRE) ×2
PACK CARDIAC CATH (CUSTOM PROCEDURE TRAY) ×2 IMPLANT
PROTECTION STATION PRESSURIZED (MISCELLANEOUS) ×2
SET ATX SIMPLICITY (MISCELLANEOUS) ×2 IMPLANT
STATION PROTECTION PRESSURIZED (MISCELLANEOUS) ×1 IMPLANT
WIRE NITINOL .018 (WIRE) ×2 IMPLANT

## 2020-06-04 NOTE — TOC Progression Note (Signed)
Transition of Care [facility]) - Progression Note    Patient Details  Name: AMEY HOSSAIN MRN: [id number] Date of Birth: 1950-05-22  [facility]) CM/SW [place], LCSW Phone Number: 06/04/2020, 8:44 AM  Clinical Narrative:   Spoke with patient who reported she does not want to go to SNF. Agreeable to home with home health, no agency preference. Confirmed home address listed. Referral made to [facility]. Patient reported she has a ride home when discharged.    Expected Discharge Plan: Home/Self Care Barriers to Discharge: No Barriers Identified  Expected Discharge Plan and Services Expected Discharge Plan: Home/Self Care In-house Referral: Clinical Social Work   Post Acute Care Choice: NA Living arrangements for the past 2 months: [place]: PT,RN [place] ([facility]) Date [place]: 06/01/20 Time [place]: 1319 Representative spoke with at [place]: [place] ([place]) Interventions    Readmission Risk Interventions Readmission Risk Prevention Plan 06/01/2020  Transportation Screening Complete  PCP or Specialist Appt within 3-5 Days Complete  HRI or [place] Complete  Social Work Consult for [place] Planning/Counseling Complete  Palliative Care Screening Not Applicable  Medication Review Press photographer) (No Data)  Some recent data might be hidden

## 2020-06-04 NOTE — Progress Notes (Signed)
[facility] [place] NAME: Tina Pacheco    MR#:  [id number]  DATE OF BIRTH:  November 22, 1950  SUBJECTIVE:   Denies any complaints. . IV heparin drip. Status post cardiac cath. Denies any chest pain. REVIEW OF SYSTEMS:   Review of Systems  Constitutional: Negative for chills, fever and weight loss.  HENT: Negative for ear discharge, ear pain and nosebleeds.   Eyes: Negative for blurred vision, pain and discharge.  Respiratory: Negative for sputum production, shortness of breath, wheezing and stridor.   Cardiovascular: Negative for chest pain, palpitations, orthopnea and PND.  Gastrointestinal: Negative for abdominal pain, diarrhea, nausea and vomiting.  Genitourinary: Negative for frequency and urgency.  Musculoskeletal: Negative for back pain and joint pain.  Neurological: Positive for weakness. Negative for sensory change, speech change and focal weakness.  Psychiatric/Behavioral: Negative for depression and hallucinations. The patient is not nervous/anxious.    Tolerating Diet:yes Tolerating PT: rec SNF--pt wants to go home  DRUG ALLERGIES:   Allergies  Allergen Reactions  . Fluoxetine     Headache, shaking, sleep issues  . Codeine     Nausea and vomiting/only when taking too much    VITALS:  Blood pressure (!) 101/51, pulse (!) 50, temperature 98.2 F (36.8 C), temperature source Oral, resp. rate 16, height 5\' 3"  (1.6 m), weight (!) 142.9 kg, SpO2 93 %.  PHYSICAL EXAMINATION:   Physical Exam  GENERAL:  70 y.o.-year-old patient lying in the bed with no acute distress.  Morbid obesity LUNGS: Normal breath sounds bilaterally, no wheezing, rales, rhonchi. No use of accessory muscles of respiration.  CARDIOVASCULAR: S1, S2 normal. No murmurs, rubs, or gallops.  ABDOMEN: Soft, nontender, nondistended. Bowel sounds present. No organomegaly or mass.  EXTREMITIES: No cyanosis, clubbing or edema b/l.    NEUROLOGIC: Cranial nerves II  through XII are intact. No focal Motor or sensory deficits b/l.   PSYCHIATRIC:  patient is alert and oriented x 3.  SKIN: No obvious rash, lesion, or ulcer.   LABORATORY PANEL:  CBC Recent Labs  Lab 06/04/20 0301  WBC 9.0  HGB 12.8  HCT 39.0  PLT 211    Chemistries  Recent Labs  Lab 06/02/20 0401  NA 134*  K 3.8  CL 92*  CO2 31  GLUCOSE 100*  BUN 30*  CREATININE 1.10*  CALCIUM 10.1  MG 1.9  AST 24  ALT 18  ALKPHOS 83  BILITOT 1.4*   Cardiac Enzymes No results for input(s): TROPONINI in the last 168 hours. RADIOLOGY:  CARDIAC CATHETERIZATION  Result Date: 06/04/2020  Dist LAD lesion is 30% stenosed.  Ost LAD to Mid LAD lesion is 40% stenosed.  1.  No evidence of obstructive coronary artery disease.  Relatively stable mild to moderate proximal LAD disease. 2.  Left ventricular angiography was not performed.  EF is normal by echo. 3.  Mildly elevated left ventricular end-diastolic pressure. Recommendations: Continue medical therapy. Eliquis can be resumed tomorrow morning.   ASSESSMENT AND PLAN:  70 year old female with history of paroxysmal A. fib on Eliquis, diastolic CHF, COPD, nonobstructive CAD, RA, HTN, HLD, chronic pain, obesity comes into the hospital with sudden onset of chest pain shortness of breath. In the ER she was found to be fluid overloaded, chest x-ray showed mild pulmonary edema and she was in A. fib with RVR.  She did not respond to diltiazem and was placed on amiodarone infusion  Acute hypoxic respiratory failure due to acute pulmonary edema in  the setting of acute on chronic diastolic heart failure --she is compliant with all her medications however it seems that at times she is struggling with her diet and also with her fluid intake.  Suspect this may be part of the problem --She has been started on IV Lasix, she is responding well, however developed mild AKI this morning. Net negative 5.7 L. Hold lasix, appreciate cards input --echo yesterday  showed normal EF, grade 1 DD --resumed torsemide by cardiology  Paroxysmal A. fib, now with RVR -Patient had been on amiodarone infusion given the fact that she did not respond to IV diltiazem.  -- She is chronically anticoagulated with Eliquis (onhold)-- resume and from tomorrow -- patient currently on oral amiodarone 400 mg BID and metoprolol--HR much improved  Hypokalemia -  replete potassium  AKIsuspect due to over diuresis with IV Lasix  -- currently on oral torsemide --creat 1.1 today  Elevated troponin/abnormal stress test -Overall flat, not in a pattern consistent with ACS, she has intermittent chest pains likely related to RVR.   -- two days stress test shows mid and apical anterior,  anterolateral and inflow lateral reversible defect.   -- cardiac cath showed Dist LAD lesion is 30% stenosed. Ost LAD to Mid LAD lesion is 40% stenosed.   1.  No evidence of obstructive coronary artery disease.  Relatively stable mild to moderate proximal LAD disease. 2.  Left ventricular angiography was not performed.  EF is normal by echo. 3.  Mildly elevated left ventricular end-diastolic pressure.  Recommendations-- per cardiology Continue medical therapy. Eliquis can be resumed tomorrow morning  COPD -No wheezing, this appears stable  Essential hypertension -Continue home medications  Hyperlipidemia-continue atorvastatin  Rheumatoid arthritis, chronic pain-continue home medications  Morbid obesity-- complicates course and prognosis  Physical therapy recommends rehab. However patient desires to go home with home health  Procedures: Family communication : husband on the phone 4/8 Consults : cardiology CODE STATUS: full DVT Prophylaxis : heparin drip Level of care: Progressive Cardiac Status is: Inpatient  Remains inpatient appropriate because:Inpatient level of care appropriate due to severity of illness   Dispo: The patient is from: Home               Anticipated d/c is to: home              Patient currently is not medically stable to d/c.   Difficult to place patient No   Patient is status post cardiac cath. Will continue to monitor overnight resume eliquis tomorrow and plan to discharge her home with home health if she remains stable.     TOTAL TIME TAKING CARE OF THIS PATIENT: 30 minutes.  >50% time spent on counselling and coordination of care  Note: This dictation was prepared with Dragon dictation along with smaller phrase technology. Any transcriptional errors that result from this process are unintentional.  Fritzi Mandes M.D    [facility]   CC: Primary care physician; Venia Carbon, MDPatient ID: Tina Pacheco, female   DOB: Nov 24, 1950, 70 y.o.   MRN: [id number]

## 2020-06-04 NOTE — Progress Notes (Signed)
Progress Note  Patient Name: Tina Pacheco Date of Encounter: 06/04/2020  Primary Cardiologist: Ida Rogue, MD  Subjective   Sitting up in bed with husband and daughter at bedside. Denies c/o chest discomfort, dyspnea, syncope, palpitations, orthopnea, PND. Awaiting cardiac cath later today.   Inpatient Medications    Scheduled Meds: . amiodarone  200 mg Oral BID   Followed by  . [START ON 06/11/2020] amiodarone  200 mg Oral Daily  . amLODipine  10 mg Oral QPM  . aspirin EC  81 mg Oral Daily  . atorvastatin  20 mg Oral QHS  . carvedilol  12.5 mg Oral BID WC  . gabapentin  300 mg Oral QID  . leflunomide  20 mg Oral Daily  . montelukast  10 mg Oral QHS  . oxyCODONE  20 mg Oral BID  . polyethylene glycol  17 g Oral Daily  . predniSONE  5 mg Oral Q breakfast  . senna-docusate  1 tablet Oral BID  . silver sulfADIAZINE   Topical BID  . sodium chloride flush  3 mL Intravenous Q12H  . sodium chloride flush  3 mL Intravenous Q12H  . spironolactone  25 mg Oral Daily  . terazosin  10 mg Oral QHS   Continuous Infusions: . sodium chloride    . sodium chloride    . sodium chloride 75 mL/hr at 06/04/20 0635  . heparin 1,450 Units/hr (06/04/20 0507)    Vital Signs    Vitals:   06/03/20 1655 06/03/20 2013 06/04/20 0310 06/04/20 0500  BP: (!) 154/87 (!) 149/65 (!) 141/67   Pulse:  (!) 58 (!) 55   Resp: 11 18 18    Temp: 98.7 F (37.1 C) 98.2 F (36.8 C) 97.7 F (36.5 C)   TempSrc: Oral Oral Oral   SpO2: 97% 97% 99%   Weight:    (!) 143.2 kg  Height:        Intake/Output Summary (Last 24 hours) at 06/04/2020 0738 Last data filed at 06/04/2020 0507 Gross per 24 hour  Intake 2672.43 ml  Output 1300 ml  Net 1372.43 ml   Filed Weights   06/03/20 0445 06/03/20 1030 06/04/20 0500  Weight: (!) 146.4 kg (!) 142.6 kg (!) 143.2 kg    Physical Exam   GEN: Obese, well nourished, in no acute distress.  HEENT: Grossly normal.  Neck: Supple, difficult to assess JVD due to  girth. No carotid bruits, or masses. Cardiac: RRR, no murmurs, rubs, or gallops. No clubbing, cyanosis. Mild bilateral non-pitting edema. Radials 2+, DP/PT 2+ and equal bilaterally.  Respiratory:  Respirations regular and unlabored, clear to auscultation bilaterally. GI: Soft, nontender, nondistended, BS + x 4. MS: no deformity or atrophy. Skin: warm and dry, no rash. Neuro:  Strength and sensation are intact. Psych: AAOx3.  Normal affect.  Labs    Chemistry Recent Labs  Lab 05/31/20 0342 05/31/20 1552 06/01/20 0402 06/02/20 0401  NA 131*  --  136 134*  K 2.6* 3.3* 3.9 3.8  CL 89*  --  95* 92*  CO2 32  --  32 31  GLUCOSE 112*  --  103* 100*  BUN 29*  --  32* 30*  CREATININE 1.30*  --  1.23* 1.10*  CALCIUM 9.3  --  10.4* 10.1  PROT 6.6  --  6.7 6.3*  ALBUMIN 3.5  --  3.6 3.4*  AST 24  --  25 24  ALT 18  --  17 18  ALKPHOS 87  --  93 83  BILITOT 1.1  --  1.2 1.4*  GFRNONAA 44*  --  47* 54*  ANIONGAP 10  --  9 11     Hematology Recent Labs  Lab 06/02/20 0401 06/03/20 0606 06/04/20 0301  WBC 6.8 7.1 9.0  RBC 4.21 4.34 4.31  HGB 12.4 12.7 12.8  HCT 38.7 39.6 39.0  MCV 91.9 91.2 90.5  MCH 29.5 29.3 29.7  MCHC 32.0 32.1 32.8  RDW 14.6 14.5 14.4  PLT 218 200 211    Cardiac Enzymes  Recent Labs  Lab 05/28/20 2031 05/28/20 2209 05/29/20 0040 05/29/20 0243 05/30/20 0754  TROPONINIHS 17 28* 35* 37* 37*      BNP Recent Labs  Lab 05/28/20 2031  BNP 57.6     Lipids  Lab Results  Component Value Date   CHOL 113 08/27/2019   HDL 43.70 08/27/2019   LDLCALC 56 08/27/2019   TRIG 70.0 08/27/2019   CHOLHDL 3 08/27/2019    Radiology    NM Myocar Multi W/Spect W/Wall Motion / EF  Result Date: 06/01/2020  Abnormal pharmacologic myocardial perfusion stress test.  There is a large in size, moderate in severity, completely reversible defect involving the mid anterior, anterolateral, and inferolateral segments, as well as the apical anterior and lateral  segments. Defect is concerning for ischemia, though artifact cannot be excluded (shifting breast attenuation).  Left ventricular systolic function is normal (LVEF 58%).  Attenuation correction CT shows coronary artery calcification, aortic atherosclerosis, and dense mitral annular calcification.  This is an intermediate risk study.  Sensitivity and specificity of the study are degraded by the patient's morbid obesity.     Telemetry    RSR 60's bpm - Personally Reviewed  Cardiac Studies   2D Echocardiogram 05/29/20 1. Left ventricular ejection fraction, by estimation, is 60 to 65%. The  left ventricle has normal function. The left ventricle has no regional  wall motion abnormalities. There is mild left ventricular hypertrophy.  Left ventricular diastolic parameters  are consistent with Grade I diastolic dysfunction (impaired relaxation).  2. Right ventricular systolic function is normal. The right ventricular  size is normal. Tricuspid regurgitation signal is inadequate for assessing  PA pressure.  3. Left atrial size was moderately dilated.   Patient Profile     70 y.o. female with history of nonobstructive CAD, PAF on Eliquis, HTN, OSA, who is being seen for atrial fibrillation w/RVR, chest pain, and abnormal stress test.   Assessment & Plan    1. PAF: Currently maintaining sinus rhythm on oral amiodarone, heparing gtt in preparation for cath today (Eliquis held x 48 hours). She was admitted on 4/1 for a fib with RVR and mid-sternal chest pain. Prior to admission, she did not have significant symptoms of a fib. Will resume Eliquis after cath. Continue  blocker, amiodarone.  2. Abnormal myocardial perfusion study/CAD/Demand ischemia: Hs Troponin minimally elevated with flat curve this admission. She experienced mid-sternal chest pain with bilateral severe arm weakness on 4/4 and was subsequently found to be in a fib with RVR upon arrival to [facility] ED. Her myoview on 4/5 showed mid and  apical anterior, anterolateral, and inferolateral reversible defects. She has had no further episodes of chest pain since 4/3 and is currently chest pain free. It is felt that with her hx of non-obstructive disease of LAD seen on cath 10 years ago and her multiple risk factors, we should proceed with LHC, which is scheduled for today. Holding torsemide this morning in preparation for caht.  Continue  blocker, asa, statin.   3. Acute on chronic HFpEF: LVEF 60-65%, G1DD, mild LVH, no rwa by echo 05/29/20. She had significant weight gain prior to this admission and weight has continued to fluctuate during this admission with current trend at +1 lb today. Suspect dry weight is 310 lb. Torsemide is held this AM in preparation for cardiac cath later today. She denies dyspnea. Counseling on low sodium diet and strict daily weights. Continue spironolactone,  blocker. Can consider ARB post-cath if BP continues to trend high. We switched metoprolol to carvedilol on 4/7. Low BP this am with elevated reading of 173/74 following. Will continue to monitor.   4. Essential hypertension: One low BP reading this morning, she was resting in bed at the time and was asymptomatic. Follow-up BP was elevated and trend during this admission has been elevated. On spironolactone,  blocker, amlodipine, diuretic. Changed her from metoprolol to carvedilol on 4/7 for further BP lowering. Will continue to monitor.   5. Hyperlipidemia: LDL 56 June 2021. Continue statin.   6. Obesity: Very limited activity at home, very deconditioned. Counseling on low sodium diet and strict daily weights. Agree with home sleep study for OSA evaluation.   Signed, Murray Hodgkins, NP  06/04/2020, 7:38 AM    For questions or updates, please contact   Please consult [email or web address] for contact info under Cardiology/STEMI.

## 2020-06-04 NOTE — Consult Note (Signed)
[facility] for Eliquis >> Hep gtt Indication: atrial fibrillation (cath to w/u abn'l stress test on Friday 4/8)  Patient Measurements: Heparin Dosing Weight: 89.3 kg  Labs: Recent Labs    06/01/20 0402 06/01/20 2026 06/02/20 0401 06/02/20 1429 06/03/20 0606 06/03/20 1525 06/04/20 0301  HGB 13.1  --  12.4  --  12.7  --  12.8  HCT 40.2  --  38.7  --  39.6  --  39.0  PLT 224  --  218  --  200  --  211  APTT  --   --  60*   < > 109* 54* 119*  HEPARINUNFRC  --    < > >2.20*  --  1.72* 1.54*  --   CREATININE 1.23*  --  1.10*  --   --   --   --    < > = values in this interval not displayed.    Estimated Creatinine Clearance: 66.5 mL/min (A) (by C-G formula based on SCr of 1.1 mg/dL (H)).   Medications:  No AC/APT related drug allergies Inpatient: eliquis 5mg  BID (last dose 4/5 0900), asa 81mg  QD,  PTA: eliquis 5mg  BID  Heparin Dosing Weight: 89.3 kg  Assessment: 70 yo F w/ h/o parAFib (on Eliquis), diaCHF, COPD, nonobs CAD, RA, HTN, HLD, chronic pain, obesity comes into the hospital with sudden onset of chest pain shortness of breath.  In the ER she was found to be fluid overloaded despite recent outpatient up-titration of diuretic, chest x-ray showed mild pulmonary edema and she was noted to be in A. fib with RVR. On day 4 of admission (4/5) pt had abnormal stress test and planning heart cath on Friday to further assess. Pharmacy consulted for the transition of Eliquis to Heparin gtt during interim.    BL labs: aPTT (32s), INR 1.2, H/H/plts - WNL  Goal of Therapy:  Heparin level 0.3-0.7 units/ml Anti-Xa level 0.6-1 units/ml 4hrs after LMWH dose given Monitor platelets by anticoagulation protocol: Yes  -aPTT range 66-102  4/6 0401 aPTT 60 subtherapeutic, HL remains >2.2, 1300 bolus, increase to 1450 units/hr 4/6 1429 aPTT 54 subtherapeutic  4/6 2140 aPTT 73, therapeutic x 1 4/7 0606 aPTT 109, supratherapeutic, HL 1.72 4/7 1525 aPTT 54,  subtherapeutic, HL 1.54 4/8 0301 aPTT 119, supratherapeutic  Plan --aPTT is supratherapeutic. Will decrease heparin drip rate to 1450 units/hr. --Re-check aPTT & HL 8 hours after rate change --Daily CBC per protocol while on IV heparin  Renda Rolls, PharmD, [facility] 06/04/2020 3:56 AM

## 2020-06-04 NOTE — Interval H&P Note (Signed)
History and Physical Interval Note:  06/04/2020 12:51 PM  Tina Pacheco  has presented today for surgery, with the diagnosis of abnormal stress test.  The various methods of treatment have been discussed with the patient and family. After consideration of risks, benefits and other options for treatment, the patient has consented to  Procedure(s): LEFT HEART CATH AND CORONARY ANGIOGRAPHY (N/A) as a surgical intervention.  The patient's history has been reviewed, patient examined, no change in status, stable for surgery.  I have reviewed the patient's chart and labs.  Questions were answered to the patient's satisfaction.     Kathlyn Sacramento

## 2020-06-05 DIAGNOSIS — I25119 Atherosclerotic heart disease of native coronary artery with unspecified angina pectoris: Secondary | ICD-10-CM | POA: Diagnosis not present

## 2020-06-05 DIAGNOSIS — G894 Chronic pain syndrome: Secondary | ICD-10-CM | POA: Diagnosis not present

## 2020-06-05 DIAGNOSIS — I509 Heart failure, unspecified: Secondary | ICD-10-CM | POA: Diagnosis not present

## 2020-06-05 DIAGNOSIS — I4891 Unspecified atrial fibrillation: Secondary | ICD-10-CM | POA: Diagnosis not present

## 2020-06-05 MED ORDER — AMIODARONE HCL 200 MG PO TABS
200.0000 mg | ORAL_TABLET | Freq: Two times a day (BID) | ORAL | 1 refills | Status: DC
Start: 2020-06-05 — End: 2020-06-29

## 2020-06-05 MED ORDER — NITROGLYCERIN 0.4 MG SL SUBL: 0.4000 mg | SUBLINGUAL_TABLET | SUBLINGUAL | 12 refills | Status: DC | PRN

## 2020-06-05 NOTE — Progress Notes (Signed)
Patient ID: Tina Pacheco, female   DOB: 1950-10-01, 70 y.o.   MRN: [id number]  Call by RN to inform patient was very weak and difficult to balance herself in order to transfer to wheelchair. She barely made it to the bathroom. Unsafe discharge plan. Discussed with patient she is now in agreement with rehab.  We can TOC Pamala Hurry informed to start process for rehab. Will discharge once rehab bed available.

## 2020-06-05 NOTE — NC FL2 (Addendum)
[place] LEVEL OF CARE SCREENING TOOL     IDENTIFICATION  Patient Name: Tina Pacheco Birthdate: [birth year] Sex: female Admission Date (Current Location): 05/28/2020  [place] and [place] Number:  Engineering geologist and Address:  [facility], [address] [place], [place] [address]      Provider Number: [id number]  Attending Physician Name and Address:  Fritzi Mandes, MD  Relative Name and Phone Number:  [name] [phone]    Current Level of Care: Hospital Recommended Level of Care: [place] Prior Approval Number:    Date Approved/Denied:   PASRR Number: [id number]  Discharge Plan: SNF    Current Diagnoses: Patient Active Problem List   Diagnosis Date Noted  . Abnormal nuclear stress test   . Acute on chronic congestive heart failure ([place])   . Demand ischemia ([place])   . Atrial fibrillation with rapid ventricular response ([place]) 05/28/2020  . Polymyalgia rheumatica ([place]) 02/03/2020  . Debility 01/28/2020  . Lack of physical exercise 01/28/2020  . Physical deconditioning 01/28/2020  . Intertrigo of genitocrural region due to Candida species 01/05/2020  . Intertrigo 01/05/2020  . Heart failure ([place]) 01/05/2020  . Chronic diastolic heart failure ([place])   . Pressure injury of skin 03/31/2019  . COPD (chronic obstructive pulmonary disease) ([place]) 03/30/2019  . Urge incontinence of urine 12/23/2018  . Left leg pain 09/18/2018  . 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 Preventative health care 03/20/2018  . MDD (major depressive disorder), single episode, moderate ([place]) 03/20/2018  . Advance directive discussed with patient 03/20/2018  . Lymphedema 11/26/2017  . Narcotic dependence ([place]) 03/09/2017  . Vertigo 06/28/2016  . Bronchiectasis without acute exacerbation ([place]) 06/15/2015  . DDD (degenerative disc disease), lumbosacral 03/30/2015  . Sleep apnea   . Atherosclerotic heart disease of  native coronary artery with angina pectoris ([place]) 08/25/2014  . Paroxysmal A-fib ([place]) 08/25/2014  . 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 RLS (restless legs syndrome) 07/06/2014  . Gout 05/26/2014  . Chronic pain syndrome 05/26/2014  . Migraine 04/08/2014  . Acquired deformity of arm 01/05/2014  . Back pain 07/22/2013  . Metatarsalgia of right foot 07/22/2013  . Insomnia 07/15/2013  . OA (osteoarthritis) 11/20/2012  . DJD (degenerative joint disease) of cervical spine 09/10/2012  . Trapezius muscle spasm 07/21/2011  . Allergic rhinitis 07/10/2011  . Rheumatoid arthritis ([place]) 03/06/2011  . Morbid obesity ([place]) 06/22/2010  . HLD (hyperlipidemia) 06/22/2010  . HTN (hypertension) 06/22/2010    Orientation RESPIRATION BLADDER Height & Weight     Self,Time,Situation,Place  Normal Incontinent (Mostly due to pain/weakness in movements/back/knees.) Weight: (!) 142.5 kg Height:  5\' 3"  (160 cm)  BEHAVIORAL SYMPTOMS/MOOD NEUROLOGICAL BOWEL NUTRITION STATUS      Continent Diet  AMBULATORY STATUS COMMUNICATION OF NEEDS Skin   Extensive Assist (Weakness in UE and pain/weakness in knees.) Verbally Other (Comment) (Contact dermatitis abdomen/groin)                       Personal Care Assistance Level of Assistance  Dressing,Bathing,Feeding (Weakness) Bathing Assistance: Limited assistance Feeding assistance: Limited assistance Dressing Assistance: Maximum assistance (weakness/pain)     Functional Limitations Info             SPECIAL CARE FACTORS FREQUENCY  PT (By licensed PT),OT (By licensed OT)     PT Frequency: 5X/week OT Frequency: 5x/week            Contractures Contractures Info:  Not present    Additional Factors Info  Code Status,Allergies Code Status Info: Full Code Allergies Info: Codeine (N&V) and Fluoxetine (Headache, shaking, sleep issues).           Current Medications (06/05/2020):  This is the current hospital active medication  list Current Facility-Administered Medications  Medication Dose Route Frequency Provider Last Rate Last Admin  . 0.9 %  sodium chloride infusion  250 mL Intravenous PRN Kathlyn Sacramento A, MD      . 0.9 %  sodium chloride infusion  250 mL Intravenous PRN Kathlyn Sacramento A, MD      . acetaminophen (TYLENOL) tablet 650 mg  650 mg Oral Q4H PRN Wellington Hampshire, MD   650 mg at 05/30/20 1433  . albuterol (PROVENTIL) (2.5 MG/3ML) 0.083% nebulizer solution 2.5 mg  2.5 mg Nebulization Q6H PRN Kathlyn Sacramento A, MD      . amiodarone (PACERONE) tablet 200 mg  200 mg Oral BID Kathlyn Sacramento A, MD   200 mg at 06/05/20 0844   Followed by  . [START ON 06/11/2020] amiodarone (PACERONE) tablet 200 mg  200 mg Oral Daily Kathlyn Sacramento A, MD      . amLODipine (NORVASC) tablet 10 mg  10 mg Oral QPM Kathlyn Sacramento A, MD   10 mg at 06/04/20 1624  . apixaban (ELIQUIS) tablet 5 mg  5 mg Oral BID Fritzi Mandes, MD   5 mg at 06/05/20 0844  . atorvastatin (LIPITOR) tablet 20 mg  20 mg Oral QHS Wellington Hampshire, MD   20 mg at 06/04/20 2118  . carvedilol (COREG) tablet 12.5 mg  12.5 mg Oral BID WC Kathlyn Sacramento A, MD   12.5 mg at 06/05/20 0843  . gabapentin (NEURONTIN) capsule 300 mg  300 mg Oral QID Kathlyn Sacramento A, MD   300 mg at 06/05/20 1425  . leflunomide (ARAVA) tablet 20 mg  20 mg Oral Daily Kathlyn Sacramento A, MD   20 mg at 06/05/20 0845  . montelukast (SINGULAIR) tablet 10 mg  10 mg Oral QHS Wellington Hampshire, MD   10 mg at 06/04/20 2118  . nitroGLYCERIN (NITROSTAT) SL tablet 0.4 mg  0.4 mg Sublingual Q5 min PRN Wellington Hampshire, MD   0.4 mg at 05/30/20 2426  . ondansetron (ZOFRAN) injection 4 mg  4 mg Intravenous Q6H PRN Wellington Hampshire, MD   4 mg at 05/31/20 1158  . oxyCODONE (Oxy IR/ROXICODONE) immediate release tablet 20 mg  20 mg Oral BID Kathlyn Sacramento A, MD   20 mg at 06/05/20 0843  . polyethylene glycol (MIRALAX / GLYCOLAX) packet 17 g  17 g Oral Daily Kathlyn Sacramento A, MD   17 g at 06/05/20 0843   . predniSONE (DELTASONE) tablet 5 mg  5 mg Oral Q breakfast Kathlyn Sacramento A, MD   5 mg at 06/05/20 0844  . senna-docusate (Senokot-S) tablet 1 tablet  1 tablet Oral BID Wellington Hampshire, MD   1 tablet at 06/05/20 0843  . silver sulfADIAZINE (SILVADENE) 1 % cream   Topical BID Wellington Hampshire, MD   Given at 06/04/20 2118  . sodium chloride flush (NS) 0.9 % injection 3 mL  3 mL Intravenous Q12H Wellington Hampshire, MD   3 mL at 06/03/20 0814  . sodium chloride flush (NS) 0.9 % injection 3 mL  3 mL Intravenous PRN Kathlyn Sacramento A, MD      . sodium chloride flush (NS) 0.9 % injection 3 mL  3  mL Intravenous Q12H Wellington Hampshire, MD   3 mL at 06/05/20 0846  . sodium chloride flush (NS) 0.9 % injection 3 mL  3 mL Intravenous Q12H Kathlyn Sacramento A, MD   3 mL at 06/05/20 0845  . sodium chloride flush (NS) 0.9 % injection 3 mL  3 mL Intravenous PRN Kathlyn Sacramento A, MD      . spironolactone (ALDACTONE) tablet 25 mg  25 mg Oral Daily Kathlyn Sacramento A, MD   25 mg at 06/05/20 0844  . terazosin (HYTRIN) capsule 10 mg  10 mg Oral QHS Wellington Hampshire, MD   10 mg at 06/04/20 2117     Discharge Medications: Please see discharge summary for a list of discharge medications.  Relevant Imaging Results:  Relevant Lab Results:   Additional Information  SSN: [id number]  Izola Price, RN

## 2020-06-05 NOTE — Discharge Summary (Addendum)
[name]    MR#:  [id number]  DATE OF BIRTH:  1950-12-22  DATE OF ADMISSION:  05/28/2020 ADMITTING PHYSICIAN: Clarnce Flock, MD  DATE OF DISCHARGE:06/05/2020  PRIMARY CARE PHYSICIAN: Venia Carbon, MD    ADMISSION DIAGNOSIS:  Atrial fibrillation with rapid ventricular response (HCC) [I48.91] Atrial fibrillation with RVR (HCC) [I48.91] Acute on chronic congestive heart failure, unspecified heart failure type ([place]) [I50.9]  DISCHARGE DIAGNOSIS:  Afib with RVR--new [place] ischemia with abnormal stress test s/p cath--medical management Acute on chronic CHF--diastolic  SECONDARY DIAGNOSIS:   Past Medical History:  Diagnosis Date  . Arthritis    RA  . Asthma   . Basal cell carcinoma   . Cataract 2018   bilateral eyes; corrected with surgery  . Claustrophobia   . Collagen vascular disease (HCC)    RA  . COPD (chronic obstructive pulmonary disease) ([place])   . Diastolic dysfunction    a. echo 07/2014: EF 55-60%, no RWMA, GR2DD, mild MR, LA moderately dilated, PASP 38 mm Hg  . Dyslipidemia   . Headache    migraines  . Hemihypertrophy   . History of cardiac cath    a. 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 History of gout   . Hyperplastic colonic polyp 2003  . Hypertension   . Hypokalemia   . Morbid obesity ([place])   . PAF (paroxysmal atrial fibrillation) (HCC)    a. on Pradaxa; b. CHADSVASc at least 2 (HTN & female)  . Rheumatoid arthritis(714.0)   . Sleep apnea    a. not compliant with CPAP    HOSPITAL COURSE:   70 year old female with history of paroxysmal A. fib on Eliquis, diastolic CHF, COPD, nonobstructive CAD, RA, HTN, HLD, chronic pain, obesity comes into the hospital with sudden onset of chest pain shortness of breath. In the ER she was found to be fluid overloaded, chest x-ray showed mild pulmonary edema and she was in A. fib with RVR. She did not respond to diltiazem and  was placed on amiodarone infusion  Acute hypoxic respiratory failure due to acute pulmonary edema in the setting of acute on chronic diastolic heart failure --echo yesterday showed normal EF, grade 1 DD --resumed torsemide by cardiology --cont BB,amlodipine  Paroxysmal A. fib, now with RVR -Patient had been on amiodarone infusion given the fact that she did not respond to IV diltiazem.  --She is chronically anticoagulated with Eliquis ([place])-- resume and from today -- patient currently on oral amiodarone 200 mg BID x7 days and then 200 mg daily  and metoprolol--HR much improved  Hypokalemia -  repleted potassium  AKIsuspect due to over diuresis with IV Lasix  -- currently on oral torsemide --creat 1.1 today  Elevated troponin/abnormal stress test -Overall flat, not in a pattern consistent with ACS, she has intermittent chest pains likely related to RVR. -- two days stress test shows mid and apical anterior,  anterolateral and inflow lateral reversible defect.  --s/p cath--chronic CAD--cont medical rx  -- cardiac cath showed Dist LAD lesion is 30% stenosed. Ost LAD to Mid LAD lesion is 40% stenosed.  1. No evidence of obstructive coronary artery disease. Relatively stable mild to moderate proximal LAD disease. 2. Left ventricular angiography was not performed. EF is normal by echo. 3. Mildly elevated left ventricular end-diastolic pressure. Recommendations-- per cardiology Continue medical therapy. Eliquis can be resumed today morning  COPD -No wheezing, this appears stable  Essential hypertension -  Continue home medications  Hyperlipidemia-continue atorvastatin  Rheumatoid arthritis, chronic pain-continue home medications  Morbid obesity-- complicates course and prognosis  Physical therapy recommends rehab. However patient desires to go home with home health  Procedures: Family communication : husband on the phone 4/8 Consults :  cardiology CODE STATUS: full DVT Prophylaxis : eliquis Level of care: Progressive Cardiac Status is: Inpatient  Dispo: The patient is from: Home  Anticipated d/c is to: home  Patient currently ismedically stable to d/c.              Difficult to place patient No ok from cardiology standpoint to go home.  CONSULTS OBTAINED:  Treatment Team:  Constance Haw, MD  DRUG ALLERGIES:   Allergies  Allergen Reactions  . Fluoxetine     Headache, shaking, sleep issues  . Codeine     Nausea and vomiting/only when taking too much    DISCHARGE MEDICATIONS:   Allergies as of 06/05/2020      Reactions   Fluoxetine    Headache, shaking, sleep issues   Codeine    Nausea and vomiting/only when taking too much      Medication List    STOP taking these medications   albuterol (2.5 MG/3ML) 0.083% nebulizer solution Commonly known as: PROVENTIL   fluconazole 100 MG tablet Commonly known as: DIFLUCAN   metolazone 2.5 MG tablet Commonly known as: ZAROXOLYN     TAKE these medications   amiodarone 200 MG tablet Commonly known as: PACERONE Take 1 tablet (200 mg total) by mouth 2 (two) times daily. Then from 06/11/2020--take 200 mg daily   amLODipine 5 MG tablet Commonly known as: NORVASC TAKE 1 TABLET BY MOUTH EVERY DAY IN THE EVENING   apixaban 5 MG Tabs tablet Commonly known as: Eliquis Take 1 tablet (5 mg total) by mouth 2 (two) times daily.   atorvastatin 20 MG tablet Commonly known as: LIPITOR TAKE 1 TABLET BY MOUTH DAILY AT 6 PM   CIMZIA [place] Inject into the skin every 30 (thirty) days.   Eucrisa 2 % Oint Generic drug: Crisaborole Apply to aa's BID PRN flares.   gabapentin 300 MG capsule Commonly known as: NEURONTIN Take 1 capsule (300 mg total) by mouth 4 (four) times daily.   ketoconazole 2 % cream Commonly known as: NIZORAL Apply 1 application topically 2 (two) times daily.   leflunomide 20 MG tablet Commonly known as:  ARAVA Take 20 mg by mouth daily.   meclizine 25 MG tablet Commonly known as: ANTIVERT TAKE 1 TABLET(25 MG) BY MOUTH THREE TIMES DAILY AS NEEDED FOR DIZZINESS   metoprolol succinate 50 MG 24 hr tablet Commonly known as: TOPROL-XL TAKE 1 TABLET BY MOUTH EVERY DAY WITH OR IMMEDIATELY FOLLOWING A MEAL   montelukast 10 MG tablet Commonly known as: SINGULAIR TAKE 1 TABLET BY MOUTH AT BEDTIME   mupirocin ointment 2 % Commonly known as: BACTROBAN Apply 1 application topically 2 (two) times daily. Qd to burn wounds   nitroGLYCERIN 0.4 MG SL tablet Commonly known as: NITROSTAT Place 1 tablet (0.4 mg total) under the tongue every 5 (five) minutes as needed for chest pain.   nystatin powder Generic drug: nystatin Apply 1 application topically 3 (three) times daily as needed.   Oxycodone HCl 20 MG Tabs Take 1 tablet (20 mg total) by mouth in the morning and at bedtime. Substitute for prev rx   polyethylene glycol 17 g packet Commonly known as: MIRALAX / GLYCOLAX Take 17 g by mouth daily as needed  for mild constipation.   potassium chloride SA 20 MEQ tablet Commonly known as: KLOR-CON Take 0.5 tablets (10 mEq total) by mouth 2 (two) times daily.   predniSONE 5 MG tablet Commonly known as: DELTASONE Take 3-4 tablets (15-20 mg total) by mouth daily with breakfast. Wean as directed   SF 5000 Plus 1.1 % Crea dental cream Generic drug: sodium fluoride Place 1 application onto teeth 2 (two) times daily.   spironolactone 25 MG tablet Commonly known as: ALDACTONE TAKE 1 TABLET(25 MG) BY MOUTH DAILY   terazosin 10 MG capsule Commonly known as: HYTRIN TAKE 1 CAPSULE(10 MG) BY MOUTH AT BEDTIME   torsemide 20 MG tablet Commonly known as: DEMADEX Take 40 mg by mouth daily. Takes an extra tablet when feeling SOB or has noticeable edema            Discharge Care Instructions  (From admission, onward)         Start     Ordered   06/05/20 0000  Discharge wound care:        Comments: Apply to left breast and left abdomen thermal injury (burn) (Present on admission):  Cleanse with NS, pat gently dry. Apply a thin layer of silver sulfadiazine cream, top with saline moistened gauze 4x4 (opened).  Cover with ABD pad and secure with paper tape.  Change twice daily.   06/05/20 0814          If you experience worsening of your admission symptoms, develop shortness of breath, life threatening emergency, suicidal or homicidal thoughts you must seek medical attention immediately by calling 911 or calling your MD immediately  if symptoms less severe.  You Must read complete instructions/literature along with all the possible adverse reactions/side effects for all the Medicines you take and that have been prescribed to you. Take any new Medicines after you have completely understood and accept all the possible adverse reactions/side effects.   Please note  You were cared for by a hospitalist during your hospital stay. If you have any questions about your discharge medications or the care you received while you were in the hospital after you are discharged, you can call the unit and asked to speak with the hospitalist on call if the hospitalist that took care of you is not available. Once you are discharged, your primary care physician will handle any further medical issues. Please note that NO REFILLS for any discharge medications will be authorized once you are discharged, as it is imperative that you return to your primary care physician (or establish a relationship with a primary care physician if you do not have one) for your aftercare needs so that they can reassess your need for medications and monitor your lab values. Today   SUBJECTIVE   No new complaints  VITAL SIGNS:  Blood pressure (!) 126/96, pulse (!) 59, temperature 98 F (36.7 C), temperature source Oral, resp. rate 12, height 5\' 3"  (1.6 m), weight (!) 142.5 kg, SpO2 94 %.  I/O:    Intake/Output Summary  (Last 24 hours) at 06/05/2020 0815 Last data filed at 06/05/2020 0300 Gross per 24 hour  Intake 120 ml  Output 1100 ml  Net -980 ml    PHYSICAL EXAMINATION:  GENERAL:  70 y.o.-year-old patient lying in the bed with no acute distress.  Morbid obesity LUNGS: Normal breath sounds bilaterally, no wheezing, rales, rhonchi. No use of accessory muscles of respiration.  CARDIOVASCULAR: S1, S2 normal. No murmurs, rubs, or gallops.  ABDOMEN: Soft, nontender, nondistended.  Bowel sounds present. No organomegaly or mass.  EXTREMITIES: No cyanosis, clubbing or edema b/l.    NEUROLOGIC: Cranial nerves II through XII are intact. No focal Motor or sensory deficits b/l.   PSYCHIATRIC:  patient is alert and oriented x 3.  SKIN:  Pressure Injury 03/30/19 Buttocks Anterior Stage 2 -  Partial thickness loss of dermis presenting as a shallow open injury with a red, pink wound bed without slough. (Active)  03/30/19 0953  Location: Buttocks  Location Orientation: Anterior  Staging: Stage 2 -  Partial thickness loss of dermis presenting as a shallow open injury with a red, pink wound bed without slough.  Wound Description (Comments):   Present on Admission: Yes    DATA REVIEW:   CBC  Recent Labs  Lab 06/04/20 0301  WBC 9.0  HGB 12.8  HCT 39.0  PLT 211    Chemistries  Recent Labs  Lab 06/02/20 0401  NA 134*  K 3.8  CL 92*  CO2 31  GLUCOSE 100*  BUN 30*  CREATININE 1.10*  CALCIUM 10.1  MG 1.9  AST 24  ALT 18  ALKPHOS 83  BILITOT 1.4*    Microbiology Results   Recent Results (from the past 240 hour(s))  SARS CORONAVIRUS 2 (TAT 6-24 HRS) Nasopharyngeal Nasopharyngeal Swab     Status: None   Collection Time: 05/28/20 11:06 PM   Specimen: Nasopharyngeal Swab  Result Value Ref Range Status   SARS Coronavirus 2 NEGATIVE NEGATIVE Final    Comment: (NOTE) SARS-CoV-2 target nucleic acids are NOT DETECTED.  The SARS-CoV-2 RNA is generally detectable in upper and lower respiratory  specimens during the acute phase of infection. Negative results do not preclude SARS-CoV-2 infection, do not rule out co-infections with other pathogens, and should not be used as the sole basis for treatment or other patient management decisions. Negative results must be combined with clinical observations, patient history, and epidemiological information. The expected result is Negative.  Fact Sheet for Patients: SugarRoll.be  Fact Sheet for Healthcare Providers: https://www.woods-mathews.com/  This test is not yet approved or cleared by the [place] FDA and  has been authorized for detection and/or diagnosis of SARS-CoV-2 by FDA under an Emergency Use Authorization (EUA). This EUA will remain  in effect (meaning this test can be used) for the duration of the COVID-19 declaration under Se ction 564(b)(1) of the Act, 21 U.S.C. section 360bbb-3(b)(1), unless the authorization is terminated or revoked sooner.  Performed at [facility], [place] [address], [place], [place] [address]     RADIOLOGY:  CARDIAC CATHETERIZATION  Result Date: 06/04/2020  Dist LAD lesion is 30% stenosed.  Ost LAD to Mid LAD lesion is 40% stenosed.  1.  No evidence of obstructive coronary artery disease.  Relatively stable mild to moderate proximal LAD disease. 2.  Left ventricular angiography was not performed.  EF is normal by echo. 3.  Mildly elevated left ventricular end-diastolic pressure. Recommendations: Continue medical therapy. Eliquis can be resumed tomorrow morning.     CODE STATUS:     Code Status Orders  (From admission, onward)         Start     Ordered   05/29/20 0032  Full code  Continuous        05/29/20 0031        Code Status History    Date Active Date Inactive Code Status Order ID Comments User Context   01/28/2020 2121 01/29/2020 2324 Full Code [id number]  Cox, Amy Delane Ginger,  DO Inpatient   01/06/2020 0009 01/06/2020 2130 Full Code  [id number]  Criss Alvine, DO ED   03/30/2019 1035 04/04/2019 1520 Full Code [id number]  Ivor Costa, MD Inpatient   08/08/2017 1132 08/11/2017 2128 Full Code [id number]  Gladstone Lighter, MD Inpatient   07/13/2017 1315 07/16/2017 2126 Full Code [id number]  Gorden Harms, MD Inpatient   08/10/2014 0200 08/11/2014 1605 Full Code [id number]  Lance Coon, MD Inpatient   Advance Care Planning Activity       TOTAL TIME TAKING CARE OF THIS PATIENT: *40* minutes.    Fritzi Mandes M.D  [facility]    CC: Primary care physician; Venia Carbon, MD

## 2020-06-05 NOTE — TOC Transition Note (Addendum)
Transition of Care [facility]) - CM/SW Discharge Note   Patient Details  Name: Tina Pacheco MRN: [id number] Date of Birth: [birth year]  [facility] CM/SW Contact:  Izola Price, RN Phone Number: 06/05/2020, 10:50 AM   Clinical Narrative: Patient to be discharged Home with [facility] services via [place] arranged 06/01/20. No DME recommendations. Patient reported to refused SNF. Documentation notes patient has a ride home upon discharge. Simmie Davies RN CM [phone] 1052 am. 06/05/20    Update: Discharge canceled and recommendation if now for SNF. Simmie Davies RN CM     Final next level of care: Home w Home Health Services Barriers to Discharge: Barriers Resolved   Patient Goals and CMS Choice Patient states their goals for this hospitalization and ongoing recovery are:: to get home      Discharge Placement                    Patient and family notified of of transfer: 06/01/20  Discharge Plan and Services In-house Referral: Clinical Social Work   Post Acute Care Choice: NA          DME Arranged: N/A (None ordered.) DME Agency: NA       HH Arranged: PT,RN [place]: [place] ([place]) Date HH Agency Contacted: 06/01/20 Time [place]: 1319 Representative spoke with at [place]: [place] ([place]) Interventions     Readmission Risk Interventions Readmission Risk Prevention Plan 06/01/2020  Transportation Screening Complete  PCP or Specialist Appt within 3-5 Days Complete  HRI or [place] Complete  Social Work Consult for [place] Planning/Counseling [place] Not Applicable  Medication Review Press photographer) (No Data)  Some recent data might be hidden

## 2020-06-06 DIAGNOSIS — J449 Chronic obstructive pulmonary disease, unspecified: Secondary | ICD-10-CM | POA: Diagnosis not present

## 2020-06-06 DIAGNOSIS — G894 Chronic pain syndrome: Secondary | ICD-10-CM | POA: Diagnosis not present

## 2020-06-06 DIAGNOSIS — I509 Heart failure, unspecified: Secondary | ICD-10-CM | POA: Diagnosis not present

## 2020-06-06 DIAGNOSIS — I4891 Unspecified atrial fibrillation: Secondary | ICD-10-CM | POA: Diagnosis not present

## 2020-06-06 MED ORDER — BISACODYL 10 MG RE SUPP
10.0000 mg | Freq: Every day | RECTAL | Status: DC
  Administered 2020-06-06: 10 mg via RECTAL
  Filled 2020-06-06 (×2): qty 1

## 2020-06-06 NOTE — Progress Notes (Signed)
Patient has refused wound care. Patient has not been able to have a bowel movement.

## 2020-06-06 NOTE — TOC Progression Note (Signed)
Transition of Care [facility]) - Progression Note    Patient Details  Name: Tina Pacheco MRN: [id number] Date of Birth: 07/24/1950  Transition of Care [facility]) CM/SW Contact  Izola Price, RN Phone Number: 06/06/2020, 9:28 AM  Clinical Narrative:  FL2 and bed search started. [facility] preferred as she has been there before. Simmie Davies RN CM      Expected Discharge Plan: Home/Self Care Barriers to Discharge: Continued Medical Work up,SNF Pending bed offer (Patient was too weak upon RN trying to discharge. Had refused SNF but now accepts the need.)  Expected Discharge Plan and Services Expected Discharge Plan: Home/Self Care In-house Referral: Clinical Social Work Discharge Planning Services: CM Consult Post Acute Care Choice: NA Living arrangements for the past 2 months: Single Family Home Expected Discharge Date: 06/05/20               DME Arranged: N/A (None ordered.) DME Agency: NA       HH Arranged: PT,RN [place]: [place] ([facility]) Date HH Agency Contacted: 06/01/20 Time [place]: 1319 Representative spoke with at [place]: [place] ([place]) Interventions    Readmission Risk Interventions Readmission Risk Prevention Plan 06/01/2020  Transportation Screening Complete  PCP or Specialist Appt within 3-5 Days Complete  HRI or [place] Complete  Social Work Consult for [place] Planning/Counseling Complete  Palliative Care Screening Not Applicable  Medication Review [employer]) (No Data)  Some recent data might be hidden

## 2020-06-06 NOTE — Progress Notes (Signed)
[place] at [place] NAME: Tina Pacheco    MR#:  [id number]  DATE OF BIRTH:  1951-02-25  SUBJECTIVE:  complains of constipation. Felt weak yesterday when were trying to discharge her. Now agreeable for rehab. Denies any complaints  REVIEW OF SYSTEMS:   Review of Systems  Constitutional: Negative for chills, fever and weight loss.  HENT: Negative for ear discharge, ear pain and nosebleeds.   Eyes: Negative for blurred vision, pain and discharge.  Respiratory: Negative for sputum production, shortness of breath, wheezing and stridor.   Cardiovascular: Negative for chest pain, palpitations, orthopnea and PND.  Gastrointestinal: Positive for constipation. Negative for abdominal pain, diarrhea, nausea and vomiting.  Genitourinary: Negative for frequency and urgency.  Musculoskeletal: Negative for back pain and joint pain.  Neurological: Negative for sensory change, speech change, focal weakness and weakness.  Psychiatric/Behavioral: Negative for depression and hallucinations. The patient is not nervous/anxious.    Tolerating Diet:yes Tolerating PT: rehab  DRUG ALLERGIES:   Allergies  Allergen Reactions  . Fluoxetine     Headache, shaking, sleep issues  . Codeine     Nausea and vomiting/only when taking too much    VITALS:  Blood pressure (!) 135/93, pulse (!) 58, temperature 98 F (36.7 C), temperature source Oral, resp. rate 16, height 5\' 3"  (1.6 m), weight (!) 142.5 kg, SpO2 96 %.  PHYSICAL EXAMINATION:   GENERAL:70 y.o.-year-old patient lying in the bed with no acute distress.  Morbid obesity LUNGS: Normal breath sounds bilaterally, no wheezing, rales, rhonchi. No use of accessory muscles of respiration.  CARDIOVASCULAR: S1, S2 normal. No murmurs, rubs, or gallops.  ABDOMEN: Soft, nontender, nondistended. Bowel sounds present. No organomegaly or mass.  EXTREMITIES: No cyanosis, clubbing or edema b/l.  NEUROLOGIC: Cranial  nerves II through XII are intact. No focal Motor or sensory deficits b/l.  PSYCHIATRIC: patient is alert and oriented x 3.  SKIN:  Pressure Injury 03/30/19 Buttocks Anterior Stage 2 -  Partial thickness loss of dermis presenting as a shallow open injury with a red, pink wound bed without slough. (Active)  03/30/19 0953  Location: Buttocks  Location Orientation: Anterior  Staging: Stage 2 -  Partial thickness loss of dermis presenting as a shallow open injury with a red, pink wound bed without slough.  Wound Description (Comments):   Present on Admission: Yes      LABORATORY PANEL:  CBC Recent Labs  Lab 06/04/20 0301  WBC 9.0  HGB 12.8  HCT 39.0  PLT 211    Chemistries  Recent Labs  Lab 06/02/20 0401  NA 134*  K 3.8  CL 92*  CO2 31  GLUCOSE 100*  BUN 30*  CREATININE 1.10*  CALCIUM 10.1  MG 1.9  AST 24  ALT 18  ALKPHOS 83  BILITOT 1.4*   Cardiac Enzymes No results for input(s): TROPONINI in the last 168 hours. RADIOLOGY:  CARDIAC CATHETERIZATION  Result Date: 06/04/2020  Dist LAD lesion is 30% stenosed.  Ost LAD to Mid LAD lesion is 40% stenosed.  1.  No evidence of obstructive coronary artery disease.  Relatively stable mild to moderate proximal LAD disease. 2.  Left ventricular angiography was not performed.  EF is normal by echo. 3.  Mildly elevated left ventricular end-diastolic pressure. Recommendations: Continue medical therapy. Eliquis can be resumed tomorrow morning.   ASSESSMENT AND PLAN:  70 year old female with history of paroxysmal A. fib on Eliquis, diastolic CHF, COPD, nonobstructive CAD, RA, HTN, HLD, chronic  pain, obesity comes into the hospital with sudden onset of chest pain shortness of breath. In the ER she was found to be fluid overloaded, chest x-ray showed mild pulmonary edema and she was in A. fib with RVR. She did not respond to diltiazem and was placed on amiodarone infusion  Acute hypoxic respiratory failure due to acute pulmonary  edema in the setting of acute on chronic diastolic heart failure --echo yesterday showed normal EF, grade 1 DD --resumed torsemide by cardiology --cont BB,amlodipine  Paroxysmal A. fib, now with RVR -Patient had been on amiodarone infusion given the fact that she did not respond to IV diltiazem.  --She is chronically anticoagulated with Eliquis (onhold)--resume and from today -- patient currently on oral amiodarone 200 mg BID x7 days and then 200 mg daily  and metoprolol--HR much improved  Hypokalemia - repleted potassium  AKIsuspect due to over diuresis with IV Lasix  -- currently on oral torsemide --creat 1.1 today  Elevated troponin/abnormal stress test -Overall flat, not in a pattern consistent with ACS, she has intermittent chest pains likely related to RVR. -- two days stress test shows mid and apical anterior, anterolateral and inflow lateral reversible defect.  --s/p cath--chronic CAD--cont medical rx  --cardiac cath showed Dist LAD lesion is 30% stenosed. Ost LAD to Mid LAD lesion is 40% stenosed.  1. No evidence of obstructive coronary artery disease. Relatively stable mild to moderate proximal LAD disease. 2. Left ventricular angiography was not performed. EF is normal by echo. 3. Mildly elevated left ventricular end-diastolic pressure. Recommendations--per cardiology Continue medical therapy. Eliquis can be resumed today morning  COPD -No wheezing, this appears stable  Essential hypertension -Continue home medications  Hyperlipidemia-continue atorvastatin  Rheumatoid arthritis, chronic pain-continue home medications  Morbid obesity-- complicates course and prognosis  Physical therapy recommends rehab.However patient desires to go home with home health  Procedures: Family communication: husband on the phone 4/8 Consults: cardiology CODE STATUS:full DVT Prophylaxis: eliquis Level of care:Progressive Cardiac Status is:  Inpatient  Dispo: The patient is from: Home Anticipated d/c is SW:NIOEV Patient currently ismedically stable to d/c. Difficult to place patient No  Patient is medically stable for discharge once rehab bed is available. TOC aware       TOTAL TIME TAKING CARE OF THIS PATIENT: *20* minutes.  >50% time spent on counselling and coordination of care  Note: This dictation was prepared with Dragon dictation along with smaller phrase technology. Any transcriptional errors that result from this process are unintentional.  Fritzi Mandes M.D    [facility]   CC: Primary care physician; Venia Carbon, MDPatient ID: Tina Pacheco, female   DOB: 1950/11/17, 70 y.o.   MRN: [id number]

## 2020-06-07 ENCOUNTER — Encounter: Payer: Self-pay | Admitting: Cardiovascular Disease

## 2020-06-07 LAB — CBC
HCT: 37.7 % (ref 36.0–46.0)
Hemoglobin: 12.3 g/dL (ref 12.0–15.0)
MCH: 29.9 pg (ref 26.0–34.0)
MCHC: 32.6 g/dL (ref 30.0–36.0)
MCV: 91.5 fL (ref 80.0–100.0)
Platelets: 187 10*3/uL (ref 150–400)
RBC: 4.12 MIL/uL (ref 3.87–5.11)
RDW: 14.7 % (ref 11.5–15.5)
WBC: 10 10*3/uL (ref 4.0–10.5)
nRBC: 0 % (ref 0.0–0.2)

## 2020-06-07 LAB — SARS CORONAVIRUS 2 (TAT 6-24 HRS): SARS Coronavirus 2: NEGATIVE

## 2020-06-07 NOTE — Care Management Important Message (Signed)
Important Message  Patient Details  Name: Tina Pacheco MRN: [id number] Date of Birth: 10-14-1950   Medicare Important Message Given:  Yes     Dannette Barbara 06/07/2020, 11:03 AM

## 2020-06-07 NOTE — Progress Notes (Signed)
[place] at [place] NAME: Tina Pacheco    MR#:  [id number]  DATE OF BIRTH:  [birth year]  SUBJECTIVE:   Felt weak yesterday when were trying to discharge her. Now agreeable for rehab. Denies any complaints  Had BM last pm  REVIEW OF SYSTEMS:   Review of Systems  Constitutional: Negative for chills, fever and weight loss.  HENT: Negative for ear discharge, ear pain and nosebleeds.   Eyes: Negative for blurred vision, pain and discharge.  Respiratory: Negative for sputum production, shortness of breath, wheezing and stridor.   Cardiovascular: Negative for chest pain, palpitations, orthopnea and PND.  Gastrointestinal: Negative for abdominal pain, diarrhea, nausea and vomiting.  Genitourinary: Negative for frequency and urgency.  Musculoskeletal: Negative for back pain and joint pain.  Neurological: Negative for sensory change, speech change, focal weakness and weakness.  Psychiatric/Behavioral: Negative for depression and hallucinations. The patient is not nervous/anxious.    Tolerating Diet:yes Tolerating PT: rehab  DRUG ALLERGIES:   Allergies  Allergen Reactions  . Fluoxetine     Headache, shaking, sleep issues  . Codeine     Nausea and vomiting/only when taking too much    VITALS:  Blood pressure (!) 167/52, pulse (!) 57, temperature 97.9 F (36.6 C), temperature source Oral, resp. rate 14, height 5\' 3"  (1.6 m), weight (!) 142.5 kg, SpO2 94 %.  PHYSICAL EXAMINATION:   GENERAL:70-year-old patient lying in the bed with no acute distress. patient lying in the bed with no acute distress.  Morbid obesity LUNGS: Normal breath sounds bilaterally, no wheezing, rales, rhonchi. No use of accessory muscles of respiration.  CARDIOVASCULAR: S1, S2 normal. No murmurs, rubs, or gallops.  ABDOMEN: Soft, nontender, nondistended. Bowel sounds present. No organomegaly or mass.  EXTREMITIES: No cyanosis, clubbing or edema b/l.  NEUROLOGIC: Cranial nerves II through XII are intact.  No focal Motor or sensory deficits b/l.  PSYCHIATRIC: patient is alert and oriented x 3.  SKIN:  Pressure Injury 03/30/19 Buttocks Anterior Stage 2 -  Partial thickness loss of dermis presenting as a shallow open injury with a red, pink wound bed without slough. (Active)  03/30/19 0953  Location: Buttocks  Location Orientation: Anterior  Staging: Stage 2 -  Partial thickness loss of dermis presenting as a shallow open injury with a red, pink wound bed without slough.  Wound Description (Comments):   Present on Admission: Yes      LABORATORY PANEL:  CBC Recent Labs  Lab 06/07/20 0539  WBC 10.0  HGB 12.3  HCT 37.7  PLT 187    Chemistries  Recent Labs  Lab 06/02/20 0401  NA 134*  K 3.8  CL 92*  CO2 31  GLUCOSE 100*  BUN 30*  CREATININE 1.10*  CALCIUM 10.1  MG 1.9  AST 24  ALT 18  ALKPHOS 83  BILITOT 1.4*   Cardiac Enzymes No results for input(s): TROPONINI in the last 168 hours. RADIOLOGY:  No results found. ASSESSMENT AND PLAN:  70 year old female with history of paroxysmal A. fib on Eliquis, diastolic CHF, COPD, nonobstructive CAD, RA, HTN, HLD, chronic pain, obesity comes into the hospital with sudden onset of chest pain shortness of breath. with history of paroxysmal A. fib on Eliquis, diastolic CHF, COPD, nonobstructive CAD, RA, HTN, HLD, chronic pain, obesity comes into the hospital with sudden onset of chest pain shortness of breath. In the ER she was found to be fluid overloaded, chest x-ray showed mild pulmonary edema and she was in A. fib with RVR. She did not respond to diltiazem and was placed on amiodarone infusion  Acute hypoxic respiratory failure due to acute pulmonary edema in the setting of acute on chronic diastolic heart failure --echo yesterday  showed normal EF, grade 1 DD --resumed torsemide by cardiology --cont BB,amlodipine  Paroxysmal A. fib, now with RVR -Patient had been on amiodarone infusion given the fact that she did not respond to IV diltiazem.  --She is chronically anticoagulated with Eliquis (onhold)--resume and from today -- patient currently on oral amiodarone 200 mg BID x7 days and then 200 mg daily  and  metoprolol--HR much improved  Hypokalemia - repleted potassium  AKIsuspect due to over diuresis with IV Lasix  -- currently on oral torsemide --creat 1.1 today  Elevated troponin/abnormal stress test -Overall flat, not in a pattern consistent with ACS, she has intermittent chest pains likely related to RVR. -- two days stress test shows mid and apical anterior, anterolateral and inflow lateral reversible defect.  --s/p cath--chronic CAD--cont medical rx  --cardiac cath showed Dist LAD lesion is 30% stenosed. Ost LAD to Mid LAD lesion is 40% stenosed.  1. No evidence of obstructive coronary artery disease. Relatively stable mild to moderate proximal LAD disease. 2. Left ventricular angiography was not performed. EF is normal by echo. 3. Mildly elevated left ventricular end-diastolic pressure. Recommendations--per cardiology Continue medical therapy. Eliquis can be resumed today morning  COPD -No wheezing, this appears stable  Essential hypertension -Continue home medications  Hyperlipidemia-continue atorvastatin  Rheumatoid arthritis, chronic pain-continue home medications  Morbid obesity-- complicates course and prognosis   Procedures: Family communication: husband on the phone 4/9 Consults: cardiology CODE STATUS:full DVT Prophylaxis: eliquis Level of care:Progressive Cardiac Status is: Inpatient  Dispo: The patient is from: Home Anticipated d/c is EB:RAXEN Patient currently ismedically stable to d/c. Difficult to place patient No  Patient is medically stable for discharge once rehab bed is available. TOC aware       TOTAL TIME TAKING CARE OF THIS PATIENT: *20* minutes.  >50% time spent on counselling and coordination of care  Note: This dictation was prepared with Dragon dictation along with smaller phrase technology. Any transcriptional errors that result from this process are  unintentional.  Fritzi Mandes M.D    [facility]   CC: Primary care physician; Venia Carbon, MDPatient ID: [name], female   DOB: Dec 29, 1950, 70 y.o.   MRN: [id number]

## 2020-06-08 DIAGNOSIS — Z4789 Encounter for other orthopedic aftercare: Secondary | ICD-10-CM | POA: Diagnosis not present

## 2020-06-08 DIAGNOSIS — M6281 Muscle weakness (generalized): Secondary | ICD-10-CM | POA: Diagnosis not present

## 2020-06-08 DIAGNOSIS — M545 Low back pain, unspecified: Secondary | ICD-10-CM | POA: Diagnosis not present

## 2020-06-08 DIAGNOSIS — I890 Lymphedema, not elsewhere classified: Secondary | ICD-10-CM | POA: Diagnosis not present

## 2020-06-08 DIAGNOSIS — I5032 Chronic diastolic (congestive) heart failure: Secondary | ICD-10-CM | POA: Diagnosis not present

## 2020-06-08 DIAGNOSIS — I4891 Unspecified atrial fibrillation: Secondary | ICD-10-CM | POA: Diagnosis not present

## 2020-06-08 DIAGNOSIS — I2510 Atherosclerotic heart disease of native coronary artery without angina pectoris: Secondary | ICD-10-CM | POA: Diagnosis not present

## 2020-06-08 DIAGNOSIS — I10 Essential (primary) hypertension: Secondary | ICD-10-CM | POA: Diagnosis not present

## 2020-06-08 DIAGNOSIS — Z7401 Bed confinement status: Secondary | ICD-10-CM | POA: Diagnosis not present

## 2020-06-08 DIAGNOSIS — I5033 Acute on chronic diastolic (congestive) heart failure: Secondary | ICD-10-CM | POA: Diagnosis not present

## 2020-06-08 DIAGNOSIS — E785 Hyperlipidemia, unspecified: Secondary | ICD-10-CM | POA: Diagnosis not present

## 2020-06-08 DIAGNOSIS — R278 Other lack of coordination: Secondary | ICD-10-CM | POA: Diagnosis not present

## 2020-06-08 DIAGNOSIS — M2550 Pain in unspecified joint: Secondary | ICD-10-CM | POA: Diagnosis not present

## 2020-06-08 DIAGNOSIS — E782 Mixed hyperlipidemia: Secondary | ICD-10-CM | POA: Diagnosis not present

## 2020-06-08 DIAGNOSIS — R9439 Abnormal result of other cardiovascular function study: Secondary | ICD-10-CM

## 2020-06-08 DIAGNOSIS — R2689 Other abnormalities of gait and mobility: Secondary | ICD-10-CM | POA: Diagnosis not present

## 2020-06-08 DIAGNOSIS — R2681 Unsteadiness on feet: Secondary | ICD-10-CM | POA: Diagnosis not present

## 2020-06-08 DIAGNOSIS — J449 Chronic obstructive pulmonary disease, unspecified: Secondary | ICD-10-CM | POA: Diagnosis not present

## 2020-06-08 DIAGNOSIS — G4733 Obstructive sleep apnea (adult) (pediatric): Secondary | ICD-10-CM | POA: Diagnosis not present

## 2020-06-08 DIAGNOSIS — N3281 Overactive bladder: Secondary | ICD-10-CM | POA: Diagnosis not present

## 2020-06-08 DIAGNOSIS — I509 Heart failure, unspecified: Secondary | ICD-10-CM | POA: Diagnosis not present

## 2020-06-08 DIAGNOSIS — I480 Paroxysmal atrial fibrillation: Secondary | ICD-10-CM | POA: Diagnosis not present

## 2020-06-08 DIAGNOSIS — R609 Edema, unspecified: Secondary | ICD-10-CM | POA: Diagnosis not present

## 2020-06-08 DIAGNOSIS — M069 Rheumatoid arthritis, unspecified: Secondary | ICD-10-CM | POA: Diagnosis not present

## 2020-06-08 DIAGNOSIS — R5381 Other malaise: Secondary | ICD-10-CM | POA: Diagnosis not present

## 2020-06-08 DIAGNOSIS — I25119 Atherosclerotic heart disease of native coronary artery with unspecified angina pectoris: Secondary | ICD-10-CM | POA: Diagnosis not present

## 2020-06-08 MED ORDER — OXYCODONE HCL 20 MG PO TABS: 1.0000 | ORAL_TABLET | Freq: Two times a day (BID) | ORAL | 0 refills | Status: DC

## 2020-06-08 NOTE — Progress Notes (Signed)
Physical Therapy Treatment Patient Details Name: Tina Pacheco MRN: [id number] DOB: [birth year] Today's Date: 06/08/2020    History of Present Illness Pt admitted for Afib with complaints of SOB symptoms. History includes Afib, CHF, COPD, RA, and HTN. Pending cardiac cath tomorrow.    PT Comments    Pt was pleasant and motivated to participate during the session.  Pt reported feeling much weaker over the last several days and being unable to come to standing.  Pt put forth good effort during the session and ultimately was able to come to standing with increased time and effort and then amb 3 x 6 feet with slow cadence and short shuffling steps.  Pt required seated rest breaks between walks and subjectively reported feeling as if her LE's would buckle if she tried to lift her feet up to avoid shuffling.  Pt will benefit from PT services in a SNF setting upon discharge to safely address deficits listed in patient problem list for decreased caregiver assistance and eventual return to PLOF.      Follow Up Recommendations  SNF     Equipment Recommendations  None recommended by PT    Recommendations for Other Services       Precautions / Restrictions Precautions Precautions: Fall Restrictions Weight Bearing Restrictions: No    Mobility  Bed Mobility Overal bed mobility: Modified Independent             General bed mobility comments: Extra time and effort    Transfers Overall transfer level: Needs assistance Equipment used: Rolling walker (2 wheeled) Transfers: Sit to/from Stand Sit to Stand: From elevated surface;Min guard         General transfer comment: Extra time and effort required with heavy BUE assist  Ambulation/Gait Ambulation/Gait assistance: Min guard Gait Distance (Feet): 6 Feet x 3 Assistive device: Rolling walker (2 wheeled) Gait Pattern/deviations: Step-to pattern;Trunk flexed;Shuffle Gait velocity: decreased   General Gait Details: Pt able to amb  3 x 6 feet with seated therapeutic rest breaks between sessions; heavy lean on the RW with poor clearance when advancing LE's   Stairs             Wheelchair Mobility    Modified Rankin (Stroke Patients Only)       Balance Overall balance assessment: History of Falls;Needs assistance Sitting-balance support: No upper extremity supported Sitting balance-Leahy Scale: Good     Standing balance support: Bilateral upper extremity supported;During functional activity Standing balance-Leahy Scale: Fair Standing balance comment: Heavy lean on the RW for support in standing                            Cognition Arousal/Alertness: Awake/alert Behavior During Therapy: WFL for tasks assessed/performed Overall Cognitive Status: Within Functional Limits for tasks assessed                                        Exercises Other Exercises Other Exercises: Rolling left/right for activity tolerance and core therex    General Comments        Pertinent Vitals/Pain Pain Assessment: 0-10 Pain Score: 6  Pain Location: Back Pain Descriptors / Indicators: Aching Pain Intervention(s): Premedicated before session;Monitored during session    Home Living                      Prior Function  PT Goals (current goals can now be found in the care plan section) Progress towards PT goals: Progressing toward goals    Frequency    Min 2X/week      PT Plan Current plan remains appropriate    Co-evaluation              AM-PAC PT "6 Clicks" Mobility   Outcome Measure  Help needed turning from your back to your side while in a flat bed without using bedrails?: A Little Help needed moving from lying on your back to sitting on the side of a flat bed without using bedrails?: A Little Help needed moving to and from a bed to a chair (including a wheelchair)?: A Little Help needed standing up from a chair using your arms (e.g.,  wheelchair or bedside chair)?: A Little Help needed to walk in hospital room?: A Lot Help needed climbing 3-5 steps with a railing? : Total 6 Click Score: 15    End of Session Equipment Utilized During Treatment: Gait belt Activity Tolerance: Patient tolerated treatment well Patient left: in bed;with call bell/phone within reach;with bed alarm set Nurse Communication: Mobility status PT Visit Diagnosis: Muscle weakness (generalized) (M62.81);Unsteadiness on feet (R26.81);Repeated falls (R29.6);Difficulty in walking, not elsewhere classified (R26.2)     Time: 4144-3601 PT Time Calculation (min) (ACUTE ONLY): 28 min  Charges:  $Gait Training: 8-22 mins $Therapeutic Activity: 8-22 mins                     D. Scott Mavric Cortright PT, DPT 06/08/20, 1:30 PM

## 2020-06-08 NOTE — Discharge Planning (Signed)
IV and tele removed.  Discharge papers printed and placed in facility packet for [facility].  Called and s/w Evonnie Dawes, LPN @ [facility] for report.  [facility] contacted to transport about 1430.
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
## 2020-06-08 NOTE — TOC Transition Note (Signed)
Transition of Care [facility]) - CM/SW Discharge Note   Patient Details  Name: Tina Pacheco MRN: [id number] Date of Birth: 1950-09-14  [facility] CM/SW Contact:  Eileen Stanford, LCSW Phone Number: 06/08/2020, 10:47 AM   Clinical Narrative:   Clinical Social Worker facilitated patient discharge including contacting patient family and facility to confirm patient discharge plans.  Clinical information faxed to facility and family agreeable with plan.  CSW arranged ambulance transport via [facility] (2:30) to [facility]  RN to call [phone] for report prior to discharge.      Final next level of care: Skilled Nursing Facility Barriers to Discharge: No Barriers Identified   Patient Goals and CMS Choice Patient states their goals for this hospitalization and ongoing recovery are:: to get home   Choice offered to / list presented to : Patient  Discharge Placement              Patient chooses bed at:  [facility]) Patient to be transferred to facility by: [facility]   Patient and family notified of of transfer: 06/08/20  Discharge Plan and Services In-house Referral: Clinical Social Work Discharge Planning Services: CM Consult Post Acute Care Choice: NA          DME Arranged: N/A (None ordered.) DME Agency: NA       HH Arranged: PT,RN [place]: [place] ([place]) Date [place]: 06/01/20 Time [place]: 1319 Representative spoke with at [place]: [place] ([place]) Interventions     Readmission Risk Interventions Readmission Risk Prevention Plan 06/01/2020  Transportation Screening Complete  PCP or Specialist Appt within 3-5 Days Complete  HRI or [place] Complete  Social Work Consult for [place] Planning/Counseling Complete  Palliative Care Screening Not Applicable  Medication Review Press photographer) (No Data)  Some recent data might be hidden

## 2020-06-09 ENCOUNTER — Other Ambulatory Visit: Payer: Self-pay | Admitting: *Deleted

## 2020-06-09 NOTE — Patient Outreach (Signed)
[place] [facility]) Care Management  06/09/2020  Tina Pacheco 09/17/1950 [id number]  Unsuccessful outreach attempt made to patient. RN Health Coach left HIPAA compliant voicemail message along with her contact information.  Plan: RN Health Coach will call patient within the month of May.  Emelia Loron RN, BSN [employer] [phone] [name]

## 2020-06-10 DIAGNOSIS — M069 Rheumatoid arthritis, unspecified: Secondary | ICD-10-CM | POA: Diagnosis not present

## 2020-06-10 DIAGNOSIS — R2681 Unsteadiness on feet: Secondary | ICD-10-CM | POA: Diagnosis not present

## 2020-06-10 DIAGNOSIS — G4733 Obstructive sleep apnea (adult) (pediatric): Secondary | ICD-10-CM | POA: Diagnosis not present

## 2020-06-10 DIAGNOSIS — E782 Mixed hyperlipidemia: Secondary | ICD-10-CM | POA: Diagnosis not present

## 2020-06-10 DIAGNOSIS — J449 Chronic obstructive pulmonary disease, unspecified: Secondary | ICD-10-CM | POA: Diagnosis not present

## 2020-06-10 DIAGNOSIS — I10 Essential (primary) hypertension: Secondary | ICD-10-CM | POA: Diagnosis not present

## 2020-06-10 DIAGNOSIS — I2510 Atherosclerotic heart disease of native coronary artery without angina pectoris: Secondary | ICD-10-CM | POA: Diagnosis not present

## 2020-06-10 DIAGNOSIS — I4891 Unspecified atrial fibrillation: Secondary | ICD-10-CM | POA: Diagnosis not present

## 2020-06-10 DIAGNOSIS — I5033 Acute on chronic diastolic (congestive) heart failure: Secondary | ICD-10-CM | POA: Diagnosis not present

## 2020-06-10 DIAGNOSIS — M545 Low back pain, unspecified: Secondary | ICD-10-CM | POA: Diagnosis not present

## 2020-06-10 DIAGNOSIS — M6281 Muscle weakness (generalized): Secondary | ICD-10-CM | POA: Diagnosis not present

## 2020-06-14 DIAGNOSIS — I10 Essential (primary) hypertension: Secondary | ICD-10-CM | POA: Diagnosis not present

## 2020-06-14 DIAGNOSIS — I509 Heart failure, unspecified: Secondary | ICD-10-CM | POA: Diagnosis not present

## 2020-06-14 DIAGNOSIS — I480 Paroxysmal atrial fibrillation: Secondary | ICD-10-CM | POA: Diagnosis not present

## 2020-06-14 DIAGNOSIS — R5381 Other malaise: Secondary | ICD-10-CM | POA: Diagnosis not present

## 2020-06-15 DIAGNOSIS — R5381 Other malaise: Secondary | ICD-10-CM | POA: Diagnosis not present

## 2020-06-15 DIAGNOSIS — R2681 Unsteadiness on feet: Secondary | ICD-10-CM | POA: Diagnosis not present

## 2020-06-15 DIAGNOSIS — E782 Mixed hyperlipidemia: Secondary | ICD-10-CM | POA: Diagnosis not present

## 2020-06-15 DIAGNOSIS — M545 Low back pain, unspecified: Secondary | ICD-10-CM | POA: Diagnosis not present

## 2020-06-15 DIAGNOSIS — M069 Rheumatoid arthritis, unspecified: Secondary | ICD-10-CM | POA: Diagnosis not present

## 2020-06-15 DIAGNOSIS — M6281 Muscle weakness (generalized): Secondary | ICD-10-CM | POA: Diagnosis not present

## 2020-06-15 DIAGNOSIS — I4891 Unspecified atrial fibrillation: Secondary | ICD-10-CM | POA: Diagnosis not present

## 2020-06-15 DIAGNOSIS — G4733 Obstructive sleep apnea (adult) (pediatric): Secondary | ICD-10-CM | POA: Diagnosis not present

## 2020-06-15 DIAGNOSIS — I2510 Atherosclerotic heart disease of native coronary artery without angina pectoris: Secondary | ICD-10-CM | POA: Diagnosis not present

## 2020-06-15 DIAGNOSIS — E785 Hyperlipidemia, unspecified: Secondary | ICD-10-CM | POA: Diagnosis not present

## 2020-06-15 DIAGNOSIS — J449 Chronic obstructive pulmonary disease, unspecified: Secondary | ICD-10-CM | POA: Diagnosis not present

## 2020-06-15 DIAGNOSIS — I5033 Acute on chronic diastolic (congestive) heart failure: Secondary | ICD-10-CM | POA: Diagnosis not present

## 2020-06-15 DIAGNOSIS — I10 Essential (primary) hypertension: Secondary | ICD-10-CM | POA: Diagnosis not present

## 2020-06-15 DIAGNOSIS — I509 Heart failure, unspecified: Secondary | ICD-10-CM | POA: Diagnosis not present

## 2020-06-16 ENCOUNTER — Other Ambulatory Visit: Payer: Self-pay | Admitting: *Deleted

## 2020-06-16 NOTE — Patient Outreach (Signed)
[place] [facility]) Care Management  06/16/2020  JENNFER GASSEN 11-Feb-1951 [id number]  Patient was admitted to the hospital on 05/28/20 and is currently being cared for by [facility]. Nurse Health Coach will perform case closure and has communicated with [place] Coordinator who will refer the patient to the [facility] embedded care coordination team.   Plan: [place] Closure letter sent to PCP   Emelia Loron RN, [place] [phone] [email or web address]

## 2020-06-16 NOTE — Patient Outreach (Signed)
Member screened for potential [facility] Care Management needs. Tina Pacheco resides in [facility].   Telephone call made to [facility] SW to inquire about transition plans. Care plan meeting scheduled today. SW will have more information after care plan meeting.   Discussed member was active with [place] prior. PCP has [facility] embedded care coordination team. Will continue to follow while member resides in SNF and make appropriate [facility] referrals as necessary.   Communication sent to [place] to make aware Tina Pacheco is in SNF and Probation officer is following.   Will touch base with [facility] SW at later time.    Marthenia Rolling, MSN, RN,BSN [place] Acute Care Coordinator [phone] [facility]) [phone]  (Toll free office)

## 2020-06-17 DIAGNOSIS — R609 Edema, unspecified: Secondary | ICD-10-CM | POA: Diagnosis not present

## 2020-06-22 ENCOUNTER — Ambulatory Visit: Payer: Medicare Other | Admitting: Internal Medicine

## 2020-06-22 DIAGNOSIS — I4891 Unspecified atrial fibrillation: Secondary | ICD-10-CM | POA: Diagnosis not present

## 2020-06-22 DIAGNOSIS — G4733 Obstructive sleep apnea (adult) (pediatric): Secondary | ICD-10-CM | POA: Diagnosis not present

## 2020-06-22 DIAGNOSIS — M069 Rheumatoid arthritis, unspecified: Secondary | ICD-10-CM | POA: Diagnosis not present

## 2020-06-22 DIAGNOSIS — M6281 Muscle weakness (generalized): Secondary | ICD-10-CM | POA: Diagnosis not present

## 2020-06-22 DIAGNOSIS — J449 Chronic obstructive pulmonary disease, unspecified: Secondary | ICD-10-CM | POA: Diagnosis not present

## 2020-06-22 DIAGNOSIS — E782 Mixed hyperlipidemia: Secondary | ICD-10-CM | POA: Diagnosis not present

## 2020-06-22 DIAGNOSIS — M545 Low back pain, unspecified: Secondary | ICD-10-CM | POA: Diagnosis not present

## 2020-06-22 DIAGNOSIS — I5033 Acute on chronic diastolic (congestive) heart failure: Secondary | ICD-10-CM | POA: Diagnosis not present

## 2020-06-22 DIAGNOSIS — R2681 Unsteadiness on feet: Secondary | ICD-10-CM | POA: Diagnosis not present

## 2020-06-22 DIAGNOSIS — I2510 Atherosclerotic heart disease of native coronary artery without angina pectoris: Secondary | ICD-10-CM | POA: Diagnosis not present

## 2020-06-22 DIAGNOSIS — I10 Essential (primary) hypertension: Secondary | ICD-10-CM | POA: Diagnosis not present

## 2020-06-22 NOTE — Progress Notes (Signed)
Cardiology Office Note:    Date:  06/23/2020   ID:  Tina Pacheco, DOB May 10, 1950, MRN [id number]  PCP:  Venia Carbon, MD  [facility] Cardiologist:  Ida Rogue, MD  [place] Electrophysiologist:  None   Referring MD: Venia Carbon, MD   Chief Complaint: Hospital f/u  History of Present Illness:    Tina Pacheco is a 70 y.o. 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    Patient underwent cardiac cath in 2012 which showed nonobstructive CAD.  She was diagnosed with A. fib in 2012 and has been anticoagulated with DOAC.  Echo in 2016 showed EF of 55 to 60%, no wall motion abnormality, grade 2 diastolic dysfunction, mild MR.  She was admitted in January 2021 with weakness and multiple falls and acute heart failure.  Echo showed EF of 55 to 60%, mild LVH, grade 1 diastolic dysfunction, no wall motion abnormality, mild to moderate dilated left atrium, moderate mitral valve calcification without regurgitation or stenosis.  She was admitted November 2021 for volume overload and weight gain.  She was IV diuresed.  Echo showed EF 60 to 65%.  Patient was admitted 05/28/20 with hypoxic respiratory failure due to pulmonary edema in the setting of acute on chronic diastolic CHF and afib RVR.  He also reported chest pain. She had abnormal stress test and underwent cardiac cath showing stable nonobstructive CAD. She was started on amiodarone.  Eliquis was continued.  Aspirin was discontinued due to risk of bleeding.  She was discharged on torsemide 40 mg daily.  Weight on 06/08/2020 discharge day was 326 pounds  She has been in rehab, will possible be there until Jul 08, 2020. She does PT an hour a day, she feels it is not helping. Today, the patient reports weights have been going up. She was 324lbs when she got to the rehab center. IT went 348lbs and they restarted the metolazone 2.5mg  and  torsemide was increased from 40mg  to 60mg . Went down to 345lbs.  Most recent, today was 342lbs, which patient feels is too low. Patient reports at home dry weight around 315lbs, which review of Dr. Donivan Scull notes confirms this. No salt diet at rehab. Not drinking a lot. Reports shortness of breath on exertion, possibly worse than before. She has lower leg edema on exam, but overall difficult to assess volume overload given body habitus. No chest pain. No recent fever chills. Patient uses a Corporate investment banker. She is in SR. Taking amiodarone and Eliquis. No bleeding issues. Heart rates in the 60s.   Past Medical History:  Diagnosis Date  . Arthritis    RA  . Asthma   . Basal cell carcinoma   . Cataract 2018   bilateral eyes; corrected with surgery  . Claustrophobia   . Collagen vascular disease (HCC)    RA  . COPD (chronic obstructive pulmonary disease) ([place])   . Diastolic dysfunction    a. echo 07/2014: EF 55-60%, no RWMA, GR2DD, mild MR, LA moderately dilated, PASP 38 mm Hg  . Dyslipidemia   . Headache    migraines  . Hemihypertrophy   . History of cardiac cath    a. 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 History of gout   . Hyperplastic colonic polyp 2003  . Hypertension   . Hypokalemia   . Morbid obesity ([place])   . PAF (paroxysmal atrial fibrillation) ([place])  a. on Pradaxa; b. CHADSVASc at least 2 (HTN & female)  . Rheumatoid arthritis(714.0)   . Sleep apnea    a. not compliant with CPAP    Past Surgical History:  Procedure Laterality Date  . ABDOMINAL HYSTERECTOMY    . APPLICATION OF WOUND VAC Right 08/09/2017   Procedure: APPLICATION OF WOUND VAC;  Surgeon: Albertine Patricia, DPM;  Location: [facility] ORS;  Service: Podiatry;  Laterality: Right;  . CARDIAC CATHETERIZATION  05/24/2010   nonobstructive CAD  . CATARACT EXTRACTION W/ INTRAOCULAR LENS IMPLANT Right 06/12/2016   Dr. Darleen Crocker  . CATARACT EXTRACTION W/ INTRAOCULAR LENS IMPLANT Left 06/26/2016   Dr. Darleen Crocker  .  CESAREAN SECTION    . CHOLECYSTECTOMY    . COLONOSCOPY  06/12/2011   Procedure: COLONOSCOPY;  Surgeon: Juanita Craver, MD;  Location: [facility];  Service: Endoscopy;  Laterality: N/A;  . COLONOSCOPY N/A 03/17/2013   Procedure: COLONOSCOPY;  Surgeon: Juanita Craver, MD;  Location: [facility];  Service: Endoscopy;  Laterality: N/A;  . EYE SURGERY    . FOOT ARTHRODESIS Right 07/13/2017   Procedure: ARTHRODESIS FOOT-MULTI.FUSIONS (6 JOINTS);  Surgeon: Albertine Patricia, DPM;  Location: [facility] ORS;  Service: Podiatry;  Laterality: Right;  . IRRIGATION AND DEBRIDEMENT FOOT Right 08/09/2017   Procedure: IRRIGATION AND DEBRIDEMENT FOOT;  Surgeon: Albertine Patricia, DPM;  Location: [facility] ORS;  Service: Podiatry;  Laterality: Right;  . KNEE ARTHROSCOPY     bilateral  . LEFT HEART CATH AND CORONARY ANGIOGRAPHY N/A 06/04/2020   Procedure: LEFT HEART CATH AND CORONARY ANGIOGRAPHY;  Surgeon: Wellington Hampshire, MD;  Location: [place] [facility];  Service: Cardiovascular;  Laterality: N/A;  . SINUSOTOMY    . TOOTH EXTRACTION  12/2016  . VAGINAL HYSTERECTOMY      Current Medications: No outpatient medications have been marked as taking for the 06/23/20 encounter (Appointment) with Kathlen Mody, Dirk Vanaman H, PA-C.     Allergies:   Fluoxetine and Codeine   Social History   Socioeconomic History  . Marital status: Married    Spouse name: Not on file  . Number of children: 1  . Years of education: Not on file  . Highest education level: Not on file  Occupational History  . 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 Smoking status: Never Smoker  . Smokeless tobacco: Never Used  Vaping Use  . Vaping Use: Never used  Substance and Sexual Activity  . Alcohol use: No  . Drug use: No  . Sexual activity: Not Currently  Other Topics Concern  . Not on file  Social History Narrative   No living will   Husband, then daughter should be decision maker   Would accept  resuscitation attempts   Not sure about tube feeds   Social Determinants of Health   Financial Resource Strain: Not on file  Food Insecurity: No Food Insecurity  . Worried About Charity fundraiser in the Last Year: Never true  . Ran Out of Food in the Last Year: Never true  Transportation Needs: No Transportation Needs  . Lack of Transportation (Medical): No  . Lack of Transportation (Non-Medical): No  Physical Activity: Not on file  Stress: Not on file  Social Connections: Not on file     Family History: The patient's family history includes Breast cancer in her maternal aunt and sister; Diabetes type II in her sister; Emphysema in her father; Parkinsonism in her mother; Tuberculosis in  her mother and sister.  ROS:   Please see the history of present illness.     All other systems reviewed and are negative.  EKGs/Labs/Other Studies Reviewed:    The following studies were reviewed today: Cardiac cath 06/04/2020 Conclusion    Dist LAD lesion is 30% stenosed.  Ost LAD to Mid LAD lesion is 40% stenosed.   1.  No evidence of obstructive coronary artery disease.  Relatively stable mild to moderate proximal LAD disease. 2.  Left ventricular angiography was not performed.  EF is normal by echo. 3.  Mildly elevated left ventricular end-diastolic pressure.  Recommendations: Continue medical therapy. Eliquis can be resumed tomorrow morning.  Echo 05/29/2020 1. Left ventricular ejection fraction, by estimation, is 60 to 65%. The  left ventricle has normal function. The left ventricle has no regional  wall motion abnormalities. There is mild left ventricular hypertrophy.  Left ventricular diastolic parameters  are consistent with Grade I diastolic dysfunction (impaired relaxation).  2. Right ventricular systolic function is normal. The right ventricular  size is normal. Tricuspid regurgitation signal is inadequate for assessing  PA pressure.  3. Left atrial size was  moderately dilated.   EKG:  EKG is ordered today.  The ekg ordered today demonstrates NSR 63bpm, nonspecific T wave changes  Recent Labs: 05/28/2020: B Natriuretic Peptide 57.6 06/02/2020: ALT 18; BUN 30; Creatinine, Ser 1.10; Magnesium 1.9; Potassium 3.8; Sodium 134 06/07/2020: Hemoglobin 12.3; Platelets 187  Recent Lipid Panel    Component Value Date/Time   CHOL 113 08/27/2019 1247   TRIG 70.0 08/27/2019 1247   HDL 43.70 08/27/2019 1247   CHOLHDL 3 08/27/2019 1247   VLDL 14.0 08/27/2019 1247   LDLCALC 56 08/27/2019 1247       Physical Exam:    VS:  There were no vitals taken for this visit.    Wt Readings from Last 3 Encounters:  06/08/20 (!) 326 lb 6.4 oz (148.1 kg)  05/24/20 (!) 334 lb (151.5 kg)  05/03/20 (!) 313 lb (142 kg)     GEN: Obese WF HEENT: Normal NECK: No JVD; No carotid bruits LYMPHATICS: No lymphadenopathy CARDIAC: RRR, no murmurs, rubs, gallops RESPIRATORY:  Clear to auscultation without rales, wheezing or rhonchi  ABDOMEN: Soft, non-tender, non-distended MUSCULOSKELETAL:  +lower leg edema, L>R; No deformity  SKIN: Warm and dry NEUROLOGIC:  Alert and oriented x 3 PSYCHIATRIC:  Normal affect   ASSESSMENT:    1. Chronic diastolic heart failure ([place])   2. Coronary artery disease, non-occlusive   3. Essential hypertension   4. Mixed hyperlipidemia    PLAN:    In order of problems listed above:  Paroxysmal A. Fib In SR. Continue amiodarone and BB. Continue Eliquis  Nonobstructive CAD No chest pain today. Nonobstructive CAD by recent cath, known LAD disease. Continue statin and BB. No ASA with DOAC. Will   HFpEF Recent echo showed LVEF 60-65%, G1DD, mild LVH. Weight has been slowly climbing up. At discharge she was 324-326lbs and went up to 348lbs at Rehab center.  Yesterday torsemide was increased to 60mg  daily and metolazone 2.5mg  daily was started. Difficult to assess volume given body habitus. She has lower leg edema on exam.  No RedsVest with  BMI. Says she has not not been eating or drinking more than normal. Follows low salt diet. Suspect she has volume overload. Normal kidney function by labs 4/6.  I will increase torsemide to 80mg  BID and take metolazone 2.5mg  on Thursday and Saturday. BMET and BNP today. Follow-up  in a week. High risk for rehospitalization.  Hypertension BP wnl. Continue current medicaitons  Hyperlipidemia Continue statin. Most recent statin at goal.  Obesity Recommend weight loss.   Disposition: Follow up in 1 week(s) with MD/APP   Signed, Anden Bartolo Ninfa Meeker, PA-C  06/23/2020 8:04 AM    [facility]

## 2020-06-23 ENCOUNTER — Ambulatory Visit (INDEPENDENT_AMBULATORY_CARE_PROVIDER_SITE_OTHER): Payer: Medicare Other | Admitting: Medical

## 2020-06-23 ENCOUNTER — Encounter: Payer: Self-pay | Admitting: Medical

## 2020-06-23 ENCOUNTER — Other Ambulatory Visit
Admission: RE | Admit: 2020-06-23 | Discharge: 2020-06-23 | Disposition: A | Discharge status: Home / Self Care | Payer: Medicare Other | Source: Ambulatory Visit | Attending: Medical | Admitting: Medical

## 2020-06-23 ENCOUNTER — Other Ambulatory Visit: Payer: Self-pay

## 2020-06-23 VITALS — BP 128/60 | HR 63 | Ht 63.0 in | Wt 351.0 lb

## 2020-06-23 DIAGNOSIS — I2510 Atherosclerotic heart disease of native coronary artery without angina pectoris: Secondary | ICD-10-CM | POA: Insufficient documentation

## 2020-06-23 DIAGNOSIS — I10 Essential (primary) hypertension: Secondary | ICD-10-CM

## 2020-06-23 DIAGNOSIS — I5032 Chronic diastolic (congestive) heart failure: Secondary | ICD-10-CM | POA: Diagnosis not present

## 2020-06-23 DIAGNOSIS — I480 Paroxysmal atrial fibrillation: Secondary | ICD-10-CM | POA: Diagnosis not present

## 2020-06-23 DIAGNOSIS — E782 Mixed hyperlipidemia: Secondary | ICD-10-CM

## 2020-06-23 LAB — BASIC METABOLIC PANEL
Anion gap: 13 (ref 5–15)
BUN: 34 mg/dL — ABNORMAL HIGH (ref 8–23)
CO2: 27 mmol/L (ref 22–32)
Calcium: 9.8 mg/dL (ref 8.9–10.3)
Chloride: 95 mmol/L — ABNORMAL LOW (ref 98–111)
Creatinine, Ser: 1.67 mg/dL — ABNORMAL HIGH (ref 0.44–1.00)
GFR, Estimated: 33 mL/min — ABNORMAL LOW (ref >60)
Glucose, Bld: 112 mg/dL — ABNORMAL HIGH (ref 70–99)
Potassium: 3.2 mmol/L — ABNORMAL LOW (ref 3.5–5.1)
Sodium: 135 mmol/L (ref 135–145)

## 2020-06-23 LAB — BRAIN NATRIURETIC PEPTIDE: B Natriuretic Peptide: 89 pg/mL (ref 0.0–100.0)

## 2020-06-23 NOTE — Patient Instructions (Addendum)
Medication Instructions:  Your physician has recommended you make the following change in your medication:   1. INCREASE Torsemide 40 mg twice a day 2. TAKE Metolazone 2.5 mg on Thursday & Saturday 30 minutes before Torsemide.   *If you need a refill on your cardiac medications before your next appointment, please call your pharmacy*   Lab Work: BMET & BNP over at the [facility]. Check in at registration.  If you have labs (blood work) drawn today and your tests are completely normal, you will receive your results only by: Marland Kitchen MyChart Message (if you have MyChart) OR . A paper copy in the mail If you have any lab test that is abnormal or we need to change your treatment, we will call you to review the results.   Testing/Procedures: None   Follow-Up: At [facility], you and your health needs are our priority.  As part of our continuing mission to provide you with exceptional heart care, we have created designated Provider Care Teams.  These Care Teams include your primary Cardiologist (physician) and Advanced Practice Providers (APPs -  Physician Assistants and Nurse Practitioners) who all work together to provide you with the care you need, when you need it.  Your next appointment:   1 week(s)  The format for your next appointment:   In Person  Provider:   You may see Ida Rogue, MD or one of the following Advanced Practice Providers on your designated Care Team:    Murray Hodgkins, NP  Christell Faith, PA-C  Marrianne Mood, PA-C  Cadence [place], [place]  Laurann Montana, NP

## 2020-06-24 ENCOUNTER — Other Ambulatory Visit: Payer: Self-pay | Admitting: *Deleted

## 2020-06-24 NOTE — Patient Outreach (Signed)
[place] Coordinator follow up. Member screened for potential [facility] care coordination needs.   Mrs. Grimme resides in [facility]. Telephone call made to SNF SW who reports member's care plan meeting is scheduled for today at 1330 pm. States transition plan is to return home with spouse. Will have [facility] (formerly [facility]) for home health services. Likely transition home this week or next week.   Discussed that member was active with [facility] prior to admission. Mrs. Jimmye Norman PCP office has [facility] embedded care management team.   Will plan to call Mrs. Colclasure to discuss care coordination needs.    Marthenia Rolling, MSN, RN,BSN [place] Acute Care Coordinator [phone] [facility]) [phone]  (Toll free office)

## 2020-06-25 ENCOUNTER — Telehealth: Payer: Self-pay | Admitting: *Deleted

## 2020-06-25 ENCOUNTER — Other Ambulatory Visit: Payer: Self-pay | Admitting: *Deleted

## 2020-06-25 NOTE — Telephone Encounter (Signed)
Called [facility] and he transferred call then it disconnects. After 2 attempts he took my information and will go give her nurse my number to call back.

## 2020-06-25 NOTE — Telephone Encounter (Signed)
Spoke with patients spouse per release form and he reports her being at [facility]. Will reach out to them with updated orders.

## 2020-06-25 NOTE — Telephone Encounter (Signed)
Called back to facility in order to speak with someone regarding medication recommendations. Spoke with Lorriane Shire LPN at [facility] and reviewed that patient should take Potassium 20 meq on Thursday and Saturday. On all other days she should remain on Potassium 10 meq twice a day. She verbalized understanding of instructions and read back information. She had no further questions at this time. Will update provider that facility has been provided with new instructions.

## 2020-06-25 NOTE — Patient Outreach (Signed)
[facility] Post- Acute Care Coordinator follow up. Member screened for potential [facility] care coordination needs.  Mrs. Westergard resides in [facility]. Previously active with [facility]. Has PCP that has [facility] embedded care coordination team.   Attempted to reach Mrs. Mcchesney [phone] to discuss follow up with [facility] embedded team at PCP office. No answer. HIPAA compliant voicemail message left requesting return call.   Will plan outreach again at later time. SNF SW previously indicated Mrs. Vollman will transition home soon. See writer's note from 06/24/20.   Marthenia Rolling, MSN, RN,BSN [place] Acute Care Coordinator [phone] [facility]) [phone]  (Toll free office)

## 2020-06-25 NOTE — Telephone Encounter (Signed)
-----   Message from [place], PA-C sent at 06/24/2020  4:39 PM EDT ----- Please call patient and let her know potassium was low. Request that she take 69mEQ daily with the increased torsemide and metolazone dose.

## 2020-06-28 ENCOUNTER — Other Ambulatory Visit: Payer: Self-pay | Admitting: *Deleted

## 2020-06-28 ENCOUNTER — Ambulatory Visit: Payer: Self-pay | Admitting: *Deleted

## 2020-06-28 DIAGNOSIS — I5033 Acute on chronic diastolic (congestive) heart failure: Secondary | ICD-10-CM

## 2020-06-28 DIAGNOSIS — I480 Paroxysmal atrial fibrillation: Secondary | ICD-10-CM | POA: Diagnosis not present

## 2020-06-28 DIAGNOSIS — I509 Heart failure, unspecified: Secondary | ICD-10-CM | POA: Diagnosis not present

## 2020-06-28 DIAGNOSIS — R609 Edema, unspecified: Secondary | ICD-10-CM | POA: Diagnosis not present

## 2020-06-28 NOTE — Telephone Encounter (Signed)
After calling multiple times with call being transferred, then disconnecting I was able to speak with Tina Pacheco caring for this patient. Instructed her to please give patient Potassium 10 meq and take 2 tablets (20 meq) in the AM and evening dose of 1 tablet (10 meq) for total of 3 tablets daily of the 10 meq pills daily. Patient does have appointment tomorrow with Dr. Rockey Situ here in our office and will update that nurse on this dosing and to check with provider on repeat labs.

## 2020-06-28 NOTE — Telephone Encounter (Signed)
Clarified with provider that patient is already on Potassium 10 meq twice a day for total of 20 meq daily and she provided verbal instructions to increase Potassium to 30 meq daily with repeat BMET in 5-7 days. Will reach out to facility with these updated instructions.

## 2020-06-28 NOTE — Patient Outreach (Signed)
[facility] Post- Acute Care Coordinator follow up. Mrs. Rehm resides in [facility].   Telephone call made to Mrs. Stankovic again at [phone]. Patient identifiers confirmed. Mrs. Strawderman endorses she will return home on tomorrow 06/29/20 with spouse. She will have home health. However, she cannot remember what agency. Writer to follow up with facility SW to inquire.   Discussed with Mrs. Jimmye Norman that her PCP office has [facility] embedded care management/care coordination team. Discussed how she was previously followed by [facility]. However, writer will make referral to the embedded team since she has been in hospital and now SNF. Mrs. Palma in agreeable to Probation officer making referral to [facility] embedded team.    Mrs. Sano states she has follow up appointment with the cardiologist. States " I keep having fluid build up. They are thinking it is from my atrial fib."  Received confirmation from SNF SW that [facility]  (formerly [facility]) was arranged for PT/OT/RN services.   Mrs. Hofmeister confirmed home phone is preferable  [phone]. Also has mobile [phone].  Mrs. Lanter has medical history of atrial fibrillation, HTN, CHF, COPD, CAD, HLD..  Will make referral to [facility] embedded care coordination team at [facility] for [facility] follow up. Mrs. Lineman was active with [facility] prior to admission.    Marthenia Rolling, MSN, RN,BSN [place] Acute Care Coordinator [phone] [facility]) [phone]  (Toll free office)

## 2020-06-28 NOTE — Progress Notes (Signed)
Date:  06/29/2020   ID:  Tina Pacheco, Tina Pacheco 04-Jun-1950, MRN [id number]  Patient Location:  [place] [address]   Provider location:   [facility], [place] office  PCP:  Venia Carbon, MD  Cardiologist:  Ida Rogue, MD   Chief Complaint  Patient presents with  . PHQ-9 12 Week Follow-up    Patient c/o LE edema, rash all over her body and shortness of breath. Medications reviewed by the patient's medication list from [facility].     History of Present Illness:    Tina Pacheco is a 70 y.o. female  past medical history of: morbid obesity , fibromyalgia  March 26,2012paroxysmal atrial fibrillation,  echocardiogram showing normal LV function with diastolic dysfunction,  hyperlipidemia, hypertension,  cardiac catheterization May 24 2010 mild nonobstructive coronary artery disease,  asthma/COPD,  suspected sleep apnea,  atrial fibrillation March and July 2012, October 2013.(no symptoms) started on diltiazem infusion and converted to normal sinus rhythm  rheumatoid arthritis  who presents for routine followup of her atrial fibrillation, chronic diastolic CHF.  Last office visit with myself May 24, 2020 On that visit, Weight up 25 pounds compared to 01/2020  Was taking torsemide 40s and sometimes 60 with no improvement of swelling Has been bringing in takeout, restaurant food Swelling was worse, more sedentary,  Recommended metolazone 2.5 mg for her to take sparingly Recommend twice a week with the metolazone holding for weight less than 210  With metolazone use, lab work June 23, 2020 creatinine 1.67 up from baseline 1.1 up to 1.3 Potassium 3.2  At [facility] 4/12 to 06/29/20   Feels lots of fluid      Prior records reviewed In hospital November into 01/2020, discharged on December 2 Tremendous diuresis Baseline weight likely low 300s  EKG personally reviewed by myself on todays visit NSR rate 51 bpm no  ST or T wave changes Denies any symptoms from bradycardia  Previous Echo in 2016 with EF 36%, diastolic dysfunction 07/4401: EF 55%   Prior CV studies:   The following studies were reviewed today:  Echo 2016 Left ventricle: The cavity size was normal. There was moderate   concentric hypertrophy. Systolic function was normal. The   estimated ejection fraction was in the range of 55% to 60%. Wall   motion was normal; there were no regional wall motion   abnormalities. Features are consistent with a pseudonormal left   ventricular filling pattern, with concomitant abnormal relaxation   and increased filling pressure (grade 2 diastolic dysfunction). - Mitral valve: Calcified annulus. There was mild regurgitation. - Left atrium: The atrium was moderately dilated. - Pulmonary arteries: Systolic pressure was mildly increased. PA   peak pressure: 38 mm Hg (S).  Past Medical History:  Diagnosis Date  . Arthritis    RA  . Asthma   . Basal cell carcinoma   . Cataract 2018   bilateral eyes; corrected with surgery  . Claustrophobia   . Collagen vascular disease (HCC)    RA  . COPD (chronic obstructive pulmonary disease) ([place])   . Diastolic dysfunction    a. echo 07/2014: EF 55-60%, no RWMA, GR2DD, mild MR, LA moderately dilated, PASP 38 mm Hg  . Dyslipidemia   . Headache    migraines  . Hemihypertrophy   . History of cardiac cath    a. 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 History of gout   . Hyperplastic colonic polyp 2003  .  Hypertension   . Hypokalemia   . Morbid obesity ([place])   . PAF (paroxysmal atrial fibrillation) (HCC)    a. on Pradaxa; b. CHADSVASc at least 2 (HTN & female)  . Rheumatoid arthritis(714.0)   . Sleep apnea    a. not compliant with CPAP   Past Surgical History:  Procedure Laterality Date  . ABDOMINAL HYSTERECTOMY    . APPLICATION OF WOUND VAC Right 08/09/2017   Procedure: APPLICATION OF WOUND VAC;  Surgeon: Albertine Patricia, DPM;  Location: [facility]  ORS;  Service: Podiatry;  Laterality: Right;  . CARDIAC CATHETERIZATION  05/24/2010   nonobstructive CAD  . CATARACT EXTRACTION W/ INTRAOCULAR LENS IMPLANT Right 06/12/2016   Dr. Darleen Crocker  . CATARACT EXTRACTION W/ INTRAOCULAR LENS IMPLANT Left 06/26/2016   Dr. Darleen Crocker  . CESAREAN SECTION    . CHOLECYSTECTOMY    . COLONOSCOPY  06/12/2011   Procedure: COLONOSCOPY;  Surgeon: Juanita Craver, MD;  Location: [facility];  Service: Endoscopy;  Laterality: N/A;  . COLONOSCOPY N/A 03/17/2013   Procedure: COLONOSCOPY;  Surgeon: Juanita Craver, MD;  Location: [facility] ENDOSCOPY;  Service: Endoscopy;  Laterality: N/A;  . EYE SURGERY    . FOOT ARTHRODESIS Right 07/13/2017   Procedure: ARTHRODESIS FOOT-MULTI.FUSIONS (6 JOINTS);  Surgeon: Albertine Patricia, DPM;  Location: [facility] ORS;  Service: Podiatry;  Laterality: Right;  . IRRIGATION AND DEBRIDEMENT FOOT Right 08/09/2017   Procedure: IRRIGATION AND DEBRIDEMENT FOOT;  Surgeon: Albertine Patricia, DPM;  Location: [facility] ORS;  Service: Podiatry;  Laterality: Right;  . KNEE ARTHROSCOPY     bilateral  . LEFT HEART CATH AND CORONARY ANGIOGRAPHY N/A 06/04/2020   Procedure: LEFT HEART CATH AND CORONARY ANGIOGRAPHY;  Surgeon: Wellington Hampshire, MD;  Location: [place] [facility];  Service: Cardiovascular;  Laterality: N/A;  . SINUSOTOMY    . TOOTH EXTRACTION  12/2016  . VAGINAL HYSTERECTOMY       Current Outpatient Medications on File Prior to Visit  Medication Sig Dispense Refill  . amiodarone (PACERONE) 200 MG tablet Take 1 tablet (200 mg total) by mouth 2 (two) times daily. Then from 06/11/2020--take 200 mg daily 60 tablet 1  . amLODipine (NORVASC) 5 MG tablet TAKE 1 TABLET BY MOUTH EVERY DAY IN THE EVENING 90 tablet 3  . apixaban (ELIQUIS) 5 MG TABS tablet Take 1 tablet (5 mg total) by mouth 2 (two) times daily. 180 tablet 1  . atorvastatin (LIPITOR) 20 MG tablet TAKE 1 TABLET BY MOUTH DAILY AT 6 PM 90 tablet 3  . Certolizumab Pegol (CIMZIA [place]) Inject into the  skin every 30 (thirty) days.    Stasia Cavalier (EUCRISA) 2 % OINT Apply to aa's BID PRN flares. 60 g 3  . gabapentin (NEURONTIN) 300 MG capsule Take 1 capsule (300 mg total) by mouth 4 (four) times daily. 120 capsule 11  . ketoconazole (NIZORAL) 2 % cream Apply 1 application topically 2 (two) times daily.    Marland Kitchen leflunomide (ARAVA) 20 MG tablet Take 20 mg by mouth daily.    . meclizine (ANTIVERT) 25 MG tablet TAKE 1 TABLET(25 MG) BY MOUTH THREE TIMES DAILY AS NEEDED FOR DIZZINESS 90 tablet 5  . metolazone (ZAROXOLYN) 2.5 MG tablet Take 2.5 mg by mouth daily as needed.    . metoprolol succinate (TOPROL-XL) 50 MG 24 hr tablet TAKE 1 TABLET BY MOUTH EVERY DAY WITH OR IMMEDIATELY FOLLOWING A MEAL 90 tablet 3  . montelukast (SINGULAIR) 10 MG tablet TAKE 1 TABLET BY MOUTH AT BEDTIME 90 tablet 3  .  mupirocin ointment (BACTROBAN) 2 % Apply 1 application topically 2 (two) times daily. Qd to burn wounds 30 g 3  . nitroGLYCERIN (NITROSTAT) 0.4 MG SL tablet Place 1 tablet (0.4 mg total) under the tongue every 5 (five) minutes as needed for chest pain. 20 tablet 12  . nystatin (MYCOSTATIN/NYSTOP) powder Apply 1 application topically 3 (three) times daily as needed.    . Oxycodone HCl 20 MG TABS Take 1 tablet (20 mg total) by mouth in the morning and at bedtime. Substitute for prev rx 5 tablet 0  . polyethylene glycol (MIRALAX / GLYCOLAX) packet Take 17 g by mouth daily as needed for mild constipation. 14 each 0  . potassium chloride SA (KLOR-CON) 20 MEQ tablet Take 0.5 tablets (10 mEq total) by mouth 2 (two) times daily. 90 tablet 0  . SF 5000 PLUS 1.1 % CREA dental cream Place 1 application onto teeth 2 (two) times daily.  1  . spironolactone (ALDACTONE) 25 MG tablet TAKE 1 TABLET(25 MG) BY MOUTH DAILY 30 tablet 11  . terazosin (HYTRIN) 10 MG capsule TAKE 1 CAPSULE(10 MG) BY MOUTH AT BEDTIME 90 capsule 3  . torsemide (DEMADEX) 20 MG tablet Take 40 mg by mouth daily. Takes an extra tablet when feeling SOB or has  noticeable edema     No current facility-administered medications on file prior to visit.    Allergies:   Fluoxetine and Codeine   Social History   Tobacco Use  . Smoking status: Never Smoker  . Smokeless tobacco: Never Used  Vaping Use  . Vaping Use: Never used  Substance Use Topics  . Alcohol use: No  . Drug use: No     Family Hx: The patient's family history includes Breast cancer in her maternal aunt and sister; Diabetes type II in her sister; Emphysema in her father; Parkinsonism in her mother; Tuberculosis in her mother and sister.  ROS:   Please see the history of present illness.    Review of Systems  Constitutional: Negative.   HENT: Negative.   Respiratory: Negative.   Cardiovascular: Positive for leg swelling.  Gastrointestinal: Negative.   Musculoskeletal: Positive for joint pain.  Neurological: Negative.   Psychiatric/Behavioral: Negative.   All other systems reviewed and are negative.    Labs/Other Tests and Data Reviewed:    Recent Labs: 06/02/2020: ALT 18; Magnesium 1.9 06/07/2020: Hemoglobin 12.3; Platelets 187 06/23/2020: B Natriuretic Peptide 89.0; BUN 34; Creatinine, Ser 1.67; Potassium 3.2; Sodium 135   Recent Lipid Panel Lab Results  Component Value Date/Time   CHOL 113 08/27/2019 12:47 PM   TRIG 70.0 08/27/2019 12:47 PM   HDL 43.70 08/27/2019 12:47 PM   CHOLHDL 3 08/27/2019 12:47 PM   LDLCALC 56 08/27/2019 12:47 PM    Wt Readings from Last 3 Encounters:  06/29/20 (!) 353 lb 2 oz (160.2 kg)  06/23/20 (!) 351 lb (159.2 kg)  06/08/20 (!) 326 lb 6.4 oz (148.1 kg)     Exam:    Vital Signs:  BP (!) 100/58 (BP Location: Left Wrist, Patient Position: Sitting, Cuff Size: Normal)   Pulse (!) 55   Ht 5\' 3"  (1.6 m)   Wt (!) 353 lb 2 oz (160.2 kg)   SpO2 96%   BMI 62.55 kg/m   Constitutional:  oriented to person, place, and time. No distress.  HENT:  Head: Grossly normal Eyes:  no discharge. No scleral icterus.  Neck: Unable to estimate  JVD, no carotid bruits  Cardiovascular: Regular rate and rhythm, no  murmurs appreciated 1+ pitting lower extremity edema Pulmonary/Chest: Clear to auscultation bilaterally, no wheezes or rails Abdominal: Soft.  no distension.  no tenderness.  Musculoskeletal: Normal range of motion Neurological:  normal muscle tone. Coordination normal. No atrophy Skin: Skin warm and dry Psychiatric: normal affect, pleasant   ASSESSMENT & PLAN:     1. PAF (paroxysmal atrial fibrillation) (HCC) Maintaining normal sinus rhythm  No changes to the medications  2. Essential hypertension Blood pressure is well controlled on today's visit. No changes made to the medications.  3. Mixed hyperlipidemia Cholesterol is at goal on the current lipid regimen. No changes to the medications were made.  4. Rheumatoid arthritis, involving unspecified site, unspecified rheumatoid factor presence ([place]) Started on new medication, Concerned about potential for side effects but not having any at this time  5.  Morbid obesity Long discussion today, daughter present for conversation, Strongly recommended dietary changes listing potato chips among other things that she needs to cut out entirely Weight continues to trend upwards secondary to dietary indiscretion Daughter provides the food, we have recommended that he cut it out entirely Weight needs to go down 100 pounds in order to live  6.  Depression Managed by primary care  7. Lymphedema Likely a major component of her leg swelling weight gain She is not responding to torsemide 40 twice daily given metolazone, renal function going up, BNP low Sitting most of the day, unable to tolerate Unna wraps, compression hose Will refer to vein and vascular for consideration of lymphedema compression pumps  8.  Chronic diastolic CHF Torsemide 40 twice daily, hold all metolazone, lab work today Suspect running prerenal, low BNP Leg swelling from lymphedema, exacerbated by  morbid obesity     Total encounter time more than 5 minutes  Greater than 50% was spent in counseling and coordination of care with the patient   Signed, Ida Rogue, MD  06/29/2020 9:34 AM    [place] [facility] [address], [place], [place] [address]

## 2020-06-29 ENCOUNTER — Encounter: Payer: Self-pay | Admitting: Cardiovascular Disease

## 2020-06-29 ENCOUNTER — Other Ambulatory Visit: Payer: Self-pay

## 2020-06-29 ENCOUNTER — Ambulatory Visit (INDEPENDENT_AMBULATORY_CARE_PROVIDER_SITE_OTHER): Payer: Medicare Other | Admitting: Cardiovascular Disease

## 2020-06-29 VITALS — BP 100/58 | HR 55 | Ht 63.0 in | Wt 353.1 lb

## 2020-06-29 DIAGNOSIS — I5032 Chronic diastolic (congestive) heart failure: Secondary | ICD-10-CM | POA: Diagnosis not present

## 2020-06-29 DIAGNOSIS — J449 Chronic obstructive pulmonary disease, unspecified: Secondary | ICD-10-CM | POA: Diagnosis not present

## 2020-06-29 DIAGNOSIS — I890 Lymphedema, not elsewhere classified: Secondary | ICD-10-CM

## 2020-06-29 DIAGNOSIS — E782 Mixed hyperlipidemia: Secondary | ICD-10-CM

## 2020-06-29 DIAGNOSIS — I10 Essential (primary) hypertension: Secondary | ICD-10-CM

## 2020-06-29 DIAGNOSIS — I2510 Atherosclerotic heart disease of native coronary artery without angina pectoris: Secondary | ICD-10-CM

## 2020-06-29 DIAGNOSIS — I480 Paroxysmal atrial fibrillation: Secondary | ICD-10-CM

## 2020-06-29 MED ORDER — AMIODARONE HCL 200 MG PO TABS: 200.0000 mg | ORAL_TABLET | Freq: Every day | ORAL | 11 refills | Status: DC

## 2020-06-29 NOTE — Patient Instructions (Addendum)
Rerferral to Vein and vascular in  for lymphedema  They will call to schedule  needing lymph pumps for both legs  Medication Instructions:  STOP amlodipine  Will need to monitor blood pressure daily  PLEASE CONTINUE YOUR  torsemide 40 twice a day  Amiodarone 200 mg daily  PLEASE take Metolazone only as needed,after labs have resulted, we will instruct to continue or only as needed  If you need a refill on your cardiac medications before your next appointment, please call your pharmacy.    Lab work: CMP  LABS WILL APPEAR ON MYCHART, ABNORMAL RESULTS  WILL BE CALLED   Testing/Procedures: No new testing needed   Follow-Up:  At [facility], you and your health needs are our priority.  As part of our continuing mission to provide you with exceptional heart care, we have created designated Provider Care Teams.  These Care Teams include your primary Cardiologist (physician) and Advanced Practice Providers (APPs -  Physician Assistants and Nurse Practitioners) who all work together to provide you with the care you need, when you need it.  . You will need a follow up appointment in 1 month  . Providers on your designated Care Team:   . Murray Hodgkins, NP . Christell Faith, PA-C . Marrianne Mood, PA-C  Please monitor blood pressures and keep a log of your readings.   Bring this log with you at your follow-up appt in one month   How to use a home blood pressure monitor. . Be still. . Measure at the same time every day. It's important to take the readings at the same time each day, such as morning and evening. Take reading approximately 1 1/2 to 2 hours after BP medications.   AVOID these things for 30 minutes before checking your blood pressure:  Drinking caffeine.  Drinking alcohol.  Eating.  Smoking.  Exercising.   Five minutes before checking your blood pressure:  Pee.  Sit in a dining chair. Avoid sitting in a soft couch or armchair.  Be quiet.  Do not talk.       Sit correctly. Sit with your back straight and supported (on a dining chair, rather than a sofa). Your feet should be flat on the floor and your legs should not be crossed. Your arm should be supported on a flat surface (such as a table) with the upper arm at heart level. Make sure the bottom of the cuff is placed directly above the bend of the elbow.    COVID-19 Vaccine Information can be found at: ShippingScam.co.uk For questions related to vaccine distribution or appointments, please email vaccine@[place] .com or call [phone].

## 2020-06-30 ENCOUNTER — Telehealth: Payer: Self-pay | Admitting: *Deleted

## 2020-06-30 ENCOUNTER — Ambulatory Visit: Payer: Medicare Other | Admitting: Family

## 2020-06-30 LAB — COMPREHENSIVE METABOLIC PANEL
ALT: 17 IU/L (ref 0–32)
AST: 27 IU/L (ref 0–40)
Albumin/Globulin Ratio: 1.7 (ref 1.2–2.2)
Albumin: 4 g/dL (ref 3.8–4.8)
Alkaline Phosphatase: 107 IU/L (ref 44–121)
BUN/Creatinine Ratio: 23 (ref 12–28)
BUN: 37 mg/dL — ABNORMAL HIGH (ref 8–27)
Bilirubin Total: 1.1 mg/dL (ref 0.0–1.2)
CO2: 26 mmol/L (ref 20–29)
Calcium: 10.2 mg/dL (ref 8.7–10.3)
Chloride: 92 mmol/L — ABNORMAL LOW (ref 96–106)
Creatinine, Ser: 1.62 mg/dL — ABNORMAL HIGH (ref 0.57–1.00)
Globulin, Total: 2.3 g/dL (ref 1.5–4.5)
Glucose: 95 mg/dL (ref 65–99)
Potassium: 3 mmol/L — ABNORMAL LOW (ref 3.5–5.2)
Sodium: 139 mmol/L (ref 134–144)
Total Protein: 6.3 g/dL (ref 6.0–8.5)
eGFR: 34 mL/min/{1.73_m2} — ABNORMAL LOW (ref >59)

## 2020-06-30 NOTE — Chronic Care Management (AMB) (Signed)
  Chronic Care Management   Note  06/30/2020 Name: Tina Pacheco MRN: [id number] DOB: [birth year]  Tina Pacheco is a 70 y.o. year old female who is a primary care patient of Venia Carbon, MD. I reached out to [name] by phone today in response to a referral sent by Tina Pacheco's PCP, Venia Carbon, MD.     Tina Pacheco was given information about Chronic Care Management services today including:  1. CCM service includes personalized support from designated clinical staff supervised by her physician, including individualized plan of care and coordination with other care providers 2. 24/7 contact phone numbers for assistance for urgent and routine care needs. 3. Service will only be billed when office clinical staff spend 20 minutes or more in a month to coordinate care. 4. Only one practitioner may furnish and bill the service in a calendar month. 5. The patient may stop CCM services at any time (effective at the end of the month) by phone call to the office staff. 6. The patient will be responsible for cost sharing (co-pay) of up to 20% of the service fee (after annual deductible is met).  Patient agreed to services and verbal consent obtained.   Follow up plan: Telephone appointment with care management team member scheduled for:07/06/2020  [place] [facility]

## 2020-07-01 ENCOUNTER — Telehealth: Payer: Self-pay

## 2020-07-01 DIAGNOSIS — I4891 Unspecified atrial fibrillation: Secondary | ICD-10-CM | POA: Diagnosis not present

## 2020-07-01 DIAGNOSIS — M069 Rheumatoid arthritis, unspecified: Secondary | ICD-10-CM | POA: Diagnosis not present

## 2020-07-01 DIAGNOSIS — E669 Obesity, unspecified: Secondary | ICD-10-CM | POA: Diagnosis not present

## 2020-07-01 DIAGNOSIS — I890 Lymphedema, not elsewhere classified: Secondary | ICD-10-CM | POA: Diagnosis not present

## 2020-07-01 DIAGNOSIS — M47819 Spondylosis without myelopathy or radiculopathy, site unspecified: Secondary | ICD-10-CM | POA: Diagnosis not present

## 2020-07-01 DIAGNOSIS — J449 Chronic obstructive pulmonary disease, unspecified: Secondary | ICD-10-CM | POA: Diagnosis not present

## 2020-07-01 DIAGNOSIS — G629 Polyneuropathy, unspecified: Secondary | ICD-10-CM | POA: Diagnosis not present

## 2020-07-01 DIAGNOSIS — J9601 Acute respiratory failure with hypoxia: Secondary | ICD-10-CM | POA: Diagnosis not present

## 2020-07-01 DIAGNOSIS — N179 Acute kidney failure, unspecified: Secondary | ICD-10-CM | POA: Diagnosis not present

## 2020-07-01 DIAGNOSIS — E876 Hypokalemia: Secondary | ICD-10-CM | POA: Diagnosis not present

## 2020-07-01 DIAGNOSIS — E785 Hyperlipidemia, unspecified: Secondary | ICD-10-CM | POA: Diagnosis not present

## 2020-07-01 DIAGNOSIS — I5033 Acute on chronic diastolic (congestive) heart failure: Secondary | ICD-10-CM | POA: Diagnosis not present

## 2020-07-01 DIAGNOSIS — K5900 Constipation, unspecified: Secondary | ICD-10-CM | POA: Diagnosis not present

## 2020-07-01 DIAGNOSIS — I110 Hypertensive heart disease with heart failure: Secondary | ICD-10-CM | POA: Diagnosis not present

## 2020-07-01 DIAGNOSIS — Z85828 Personal history of other malignant neoplasm of skin: Secondary | ICD-10-CM | POA: Diagnosis not present

## 2020-07-01 DIAGNOSIS — I2510 Atherosclerotic heart disease of native coronary artery without angina pectoris: Secondary | ICD-10-CM | POA: Diagnosis not present

## 2020-07-01 DIAGNOSIS — Z7901 Long term (current) use of anticoagulants: Secondary | ICD-10-CM | POA: Diagnosis not present

## 2020-07-01 DIAGNOSIS — G4730 Sleep apnea, unspecified: Secondary | ICD-10-CM | POA: Diagnosis not present

## 2020-07-01 DIAGNOSIS — M109 Gout, unspecified: Secondary | ICD-10-CM | POA: Diagnosis not present

## 2020-07-01 DIAGNOSIS — Z79891 Long term (current) use of opiate analgesic: Secondary | ICD-10-CM | POA: Diagnosis not present

## 2020-07-01 DIAGNOSIS — M1990 Unspecified osteoarthritis, unspecified site: Secondary | ICD-10-CM | POA: Diagnosis not present

## 2020-07-01 MED ORDER — POTASSIUM CHLORIDE ER 20 MEQ PO TBCR: 20.0000 meq | EXTENDED_RELEASE_TABLET | Freq: Two times a day (BID) | ORAL | 3 refills | Status: DC

## 2020-07-01 NOTE — Telephone Encounter (Signed)
Able to reach pt regarding her recent lab work, Dr. Rockey Situ had a chance to review her results and advised   'Elevated renal function consistent with running on the dry side  Suspect this is consistent with her overall picture, lymphedema  With metolazone use potassium has dropped  Will take 3 of the 20 mill equivalent potassium is to get her to low normal range  On a daily basis we will need to increase potassium from a half a pill up to a whole pill ,20"  Tina Pacheco is currently with in-home therapy for her lymphoedema, she is grateful for the phone call, will start with potassium 20 mEq (whole pill ) BID, one month f/u with Dr. Rockey Situ in June, will repeat BMP then for potassium levels. Otherwise, questions or concerns were address and no additional concerns at this time. Agreeable to plan, will call back for anything further.

## 2020-07-02 DIAGNOSIS — I110 Hypertensive heart disease with heart failure: Secondary | ICD-10-CM | POA: Diagnosis not present

## 2020-07-02 DIAGNOSIS — I5033 Acute on chronic diastolic (congestive) heart failure: Secondary | ICD-10-CM | POA: Diagnosis not present

## 2020-07-02 DIAGNOSIS — I4891 Unspecified atrial fibrillation: Secondary | ICD-10-CM | POA: Diagnosis not present

## 2020-07-02 DIAGNOSIS — I2510 Atherosclerotic heart disease of native coronary artery without angina pectoris: Secondary | ICD-10-CM | POA: Diagnosis not present

## 2020-07-02 DIAGNOSIS — J9601 Acute respiratory failure with hypoxia: Secondary | ICD-10-CM | POA: Diagnosis not present

## 2020-07-02 DIAGNOSIS — I890 Lymphedema, not elsewhere classified: Secondary | ICD-10-CM | POA: Diagnosis not present

## 2020-07-05 ENCOUNTER — Other Ambulatory Visit: Payer: Self-pay

## 2020-07-05 ENCOUNTER — Ambulatory Visit (INDEPENDENT_AMBULATORY_CARE_PROVIDER_SITE_OTHER): Payer: Medicare Other | Admitting: Nurse Practitioner

## 2020-07-05 ENCOUNTER — Encounter (INDEPENDENT_AMBULATORY_CARE_PROVIDER_SITE_OTHER): Payer: Self-pay | Admitting: Nurse Practitioner

## 2020-07-05 VITALS — BP 180/76 | HR 60 | Resp 17 | Ht 63.0 in | Wt 340.0 lb

## 2020-07-05 DIAGNOSIS — N1832 Chronic kidney disease, stage 3b: Secondary | ICD-10-CM

## 2020-07-05 DIAGNOSIS — I5030 Unspecified diastolic (congestive) heart failure: Secondary | ICD-10-CM | POA: Diagnosis not present

## 2020-07-05 DIAGNOSIS — I890 Lymphedema, not elsewhere classified: Secondary | ICD-10-CM

## 2020-07-06 ENCOUNTER — Ambulatory Visit (INDEPENDENT_AMBULATORY_CARE_PROVIDER_SITE_OTHER): Payer: Medicare Other

## 2020-07-06 DIAGNOSIS — I5032 Chronic diastolic (congestive) heart failure: Secondary | ICD-10-CM

## 2020-07-06 DIAGNOSIS — Z9181 History of falling: Secondary | ICD-10-CM

## 2020-07-06 DIAGNOSIS — I480 Paroxysmal atrial fibrillation: Secondary | ICD-10-CM

## 2020-07-06 DIAGNOSIS — I890 Lymphedema, not elsewhere classified: Secondary | ICD-10-CM

## 2020-07-07 ENCOUNTER — Encounter: Payer: Self-pay | Admitting: Internal Medicine

## 2020-07-07 ENCOUNTER — Other Ambulatory Visit: Payer: Self-pay

## 2020-07-07 ENCOUNTER — Ambulatory Visit (INDEPENDENT_AMBULATORY_CARE_PROVIDER_SITE_OTHER): Payer: Medicare Other | Admitting: Internal Medicine

## 2020-07-07 DIAGNOSIS — F1120 Opioid dependence, uncomplicated: Secondary | ICD-10-CM

## 2020-07-07 DIAGNOSIS — G894 Chronic pain syndrome: Secondary | ICD-10-CM | POA: Diagnosis not present

## 2020-07-07 DIAGNOSIS — I480 Paroxysmal atrial fibrillation: Secondary | ICD-10-CM | POA: Diagnosis not present

## 2020-07-07 DIAGNOSIS — I2510 Atherosclerotic heart disease of native coronary artery without angina pectoris: Secondary | ICD-10-CM | POA: Diagnosis not present

## 2020-07-07 DIAGNOSIS — F39 Unspecified mood [affective] disorder: Secondary | ICD-10-CM

## 2020-07-07 DIAGNOSIS — N1832 Chronic kidney disease, stage 3b: Secondary | ICD-10-CM | POA: Insufficient documentation

## 2020-07-07 DIAGNOSIS — I5032 Chronic diastolic (congestive) heart failure: Secondary | ICD-10-CM | POA: Diagnosis not present

## 2020-07-07 NOTE — Chronic Care Management (AMB) (Signed)
Chronic Care Management   CCM RN Visit Note  07/08/2020 Name: Tina Pacheco MRN: [id number] DOB: 1950/06/13  Subjective: Tina Pacheco is a 69 y.o. year old female who is a primary care patient of Venia Carbon, MD. The care management team was consulted for assistance with disease management and care coordination needs.    Engaged with patient by telephone for initial visit in response to provider referral for case management and/or care coordination services.   Consent to Services:  The patient was given the following information about Chronic Care Management services today, agreed to services, and gave verbal consent: 1. CCM service includes personalized support from designated clinical staff supervised by the primary care provider, including individualized plan of care and coordination with other care providers 2. 24/7 contact phone numbers for assistance for urgent and routine care needs. 3. Service will only be billed when office clinical staff spend 20 minutes or more in a month to coordinate care. 4. Only one practitioner may furnish and bill the service in a calendar month. 5.The patient may stop CCM services at any time (effective at the end of the month) by phone call to the office staff. 6. The patient will be responsible for cost sharing (co-pay) of up to 20% of the service fee (after annual deductible is met). Patient agreed to services and consent obtained.  Patient agreed to services and verbal consent obtained.   Assessment: Review of patient past medical history, allergies, medications, health status, including review of consultants reports, laboratory and other test data, was performed as part of comprehensive evaluation and provision of chronic care management services.   SDOH (Social Determinants of Health) assessments and interventions performed:  SDOH Interventions   Flowsheet Row Most Recent Value  SDOH Interventions   Food Insecurity Interventions Intervention  Not Indicated  Housing Interventions Intervention Not Indicated  Transportation Interventions Intervention Not Indicated  Depression Interventions/Treatment  --  [refer patient to Education officer, museum for follow up]       CCM Care Plan  Allergies  Allergen Reactions  . Fluoxetine     Headache, shaking, sleep issues  . Codeine     Nausea and vomiting/only when taking too much    Outpatient Encounter Medications as of 07/06/2020  Medication Sig Note  . amiodarone (PACERONE) 200 MG tablet Take 1 tablet (200 mg total) by mouth daily. Take one tab daily.   Marland Kitchen apixaban (ELIQUIS) 5 MG TABS tablet Take 1 tablet (5 mg total) by mouth 2 (two) times daily.   Marland Kitchen atorvastatin (LIPITOR) 20 MG tablet TAKE 1 TABLET BY MOUTH DAILY AT 6 PM   . Crisaborole (EUCRISA) 2 % OINT Apply to aa's BID PRN flares.   Marland Kitchen ketoconazole (NIZORAL) 2 % cream Apply 1 application topically 2 (two) times daily.   Marland Kitchen leflunomide (ARAVA) 20 MG tablet Take 20 mg by mouth daily.   . meclizine (ANTIVERT) 25 MG tablet TAKE 1 TABLET(25 MG) BY MOUTH THREE TIMES DAILY AS NEEDED FOR DIZZINESS   . metolazone (ZAROXOLYN) 2.5 MG tablet Take 2.5 mg by mouth daily as needed. 07/06/2020: Patient states on Thursdays and Saturdays  . metoprolol succinate (TOPROL-XL) 50 MG 24 hr tablet TAKE 1 TABLET BY MOUTH EVERY DAY WITH OR IMMEDIATELY FOLLOWING A MEAL   . montelukast (SINGULAIR) 10 MG tablet TAKE 1 TABLET BY MOUTH AT BEDTIME   . mupirocin ointment (BACTROBAN) 2 % Apply 1 application topically 2 (two) times daily. Qd to burn wounds   . nystatin (MYCOSTATIN/NYSTOP)  powder Apply 1 application topically 3 (three) times daily as needed.   . Oxycodone HCl 20 MG TABS Take 1 tablet (20 mg total) by mouth in the morning and at bedtime. Substitute for prev rx   . polyethylene glycol (MIRALAX / GLYCOLAX) packet Take 17 g by mouth daily as needed for mild constipation.   . Potassium Chloride ER 20 MEQ TBCR Take 20 mEq by mouth in the morning and at bedtime.   Marland Kitchen  spironolactone (ALDACTONE) 25 MG tablet TAKE 1 TABLET(25 MG) BY MOUTH DAILY   . terazosin (HYTRIN) 10 MG capsule TAKE 1 CAPSULE(10 MG) BY MOUTH AT BEDTIME   . torsemide (DEMADEX) 20 MG tablet Take 40 mg by mouth daily. Takes an extra tablet when feeling SOB or has noticeable edema   . Certolizumab Pegol (CIMZIA [place]) Inject into the skin every 30 (thirty) days.   Marland Kitchen gabapentin (NEURONTIN) 300 MG capsule Take 1 capsule (300 mg total) by mouth 4 (four) times daily. 07/06/2020: Patient states she takes as needed  . nitroGLYCERIN (NITROSTAT) 0.4 MG SL tablet Place 1 tablet (0.4 mg total) under the tongue every 5 (five) minutes as needed for chest pain.   . SF 5000 PLUS 1.1 % CREA dental cream Place 1 application onto teeth 2 (two) times daily.    No facility-administered encounter medications on file as of 07/06/2020.    Patient Active Problem List   Diagnosis Date Noted  . Stage 3b chronic kidney disease ([place]) 07/07/2020  . Abnormal nuclear stress test   . Demand ischemia ([place])   . Atrial fibrillation with rapid ventricular response ([place]) 05/28/2020  . Polymyalgia rheumatica ([place]) 02/03/2020  . Debility 01/28/2020  . Intertrigo of genitocrural region due to Candida species 01/05/2020  . Intertrigo 01/05/2020  . Heart failure ([place]) 01/05/2020  . Chronic diastolic heart failure ([place])   . Pressure injury of skin 03/31/2019  . COPD (chronic obstructive pulmonary disease) ([place]) 03/30/2019  . Urge incontinence of urine 12/23/2018  . Left leg pain 09/18/2018  . 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 Preventative health care 03/20/2018  . Mood disorder ([place]) 03/20/2018  . Advance directive discussed with patient 03/20/2018  . Lymphedema 11/26/2017  . Narcotic dependence ([place]) 03/09/2017  . Vertigo 06/28/2016  . Bronchiectasis without acute exacerbation ([place]) 06/15/2015  . DDD (degenerative disc disease), lumbosacral 03/30/2015  . Sleep apnea   . Atherosclerotic heart disease of  native coronary artery with angina pectoris ([place]) 08/25/2014  . Paroxysmal A-fib ([place]) 08/25/2014  . 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 RLS (restless legs syndrome) 07/06/2014  . Gout 05/26/2014  . Chronic pain syndrome 05/26/2014  . Migraine 04/08/2014  . Acquired deformity of arm 01/05/2014  . Back pain 07/22/2013  . Metatarsalgia of right foot 07/22/2013  . Insomnia 07/15/2013  . OA (osteoarthritis) 11/20/2012  . DJD (degenerative joint disease) of cervical spine 09/10/2012  . Trapezius muscle spasm 07/21/2011  . Allergic rhinitis 07/10/2011  . Rheumatoid arthritis ([place]) 03/06/2011  . Morbid obesity ([place]) 06/22/2010  . HLD (hyperlipidemia) 06/22/2010  . HTN (hypertension) 06/22/2010    Conditions to be addressed/monitored:Atrial Fibrillation, CHF and Lymphedema and falls  Care Plan : Cardiovascular  Updates made by Dannielle Karvonen, RN since 07/08/2020 12:00 AM  Problem: Knowledge deficit related to long term care management of heart failure and atrial fibrillation.   Priority: High  Long-Range Goal: Symptom exacerbation for atrial fibrillation/ heart failure prevented/ minimized   Start Date: 01/08/2020  Expected  End Date: 10/27/2020  This Visit's Progress: On track  Priority: High  Current Barriers: 70 year old female with history of paroxysmal A. fib on Eliquis and diastolic CHF.  Patient reports she was hospitalized from 05/28/20 to 06/08/20 due to A. Fib then transitioned to Skilled nursing facility from 06/08/20 to 06/29/2020. Patient states she currently has home health services for nursing, PT and OT. She states she has difficulty walking and uses a Rollator for assistance.  Patient states she is followed by her primary care provider, cardiologist and vascular doctor.  She report her next follow up visit with her primary care provider is 07/07/2020. Patient states she has transportation to her appointments and takes her medications as prescribed. She reports  weighing and recording weights daily. Patient states she is has difficulty using her cuff blood pressure monitor. She does not feel she is using it correctly.   RNCM advised patient to have the home health nurse demonstrate how to use the blood pressure monitor. Patient verbalized agreement.  . Knowledge deficit related to basic pathophysiology and self care management of heart failure and atrial fibrillation . No Advanced Directives in place:  Advanced directive discussed and packet mailed to patient.  Nurse Case Manager Clinical Goal(s):   patient will take all medications as prescribed and will call provider and / or RN care manager for medication related questions  patient will verbalize understanding of Afib and heart failure Action Plan and when to call doctor  patient will attend scheduled medical appointments:  Patient reports she had an initial visit with the vascular doctor on 07/05/2020 with next visit being on 07/13/2020.  Patient report her next cardiology follow visit is scheduled for 07/30/2020.   patient will work with CM clinical Education officer, museum regarding anxiety/ depression symptoms.   the patient will demonstrate ongoing self health care management ability as evidenced by weighing and recording weights daily, checking blood pressure daily, taking medications as prescribed, and adhering to a heart healthy low sodium diet.   the patient will work with the care management team towards completion of advanced directives Interventions:  . Collaboration with Venia Carbon, MD regarding development and update of comprehensive plan of care as evidenced by provider attestation and co-signature . Inter-disciplinary care team collaboration (see longitudinal plan of care) . Discussed basic overview of afib and heart failure disease state provided . Reviewed and discussed medications  . Written and verbal education provided regarding Heart failure and Afib action plan. . Referred patient to  social worker for anxiety/ depression symptoms.  . Written education information provided to patient on heart healthy diet low sodium diet. . Discussed importance of daily weight and advised patient to weigh and record daily . Advised patient, providing education and rationale, to monitor blood pressure daily and record. Contact doctor for concerns.  . Reviewed scheduled/upcoming provider appointments. . Discussed plans with patient for ongoing care management follow up and provided patient with direct contact information for care management team. Self-Care Activities: Self administers medications as prescribed.  Attends all scheduled provider appointments Calls pharmacy for medication refills Calls provider office for new concerns or questions Patient Goals: - check pulse (heart) rate once a day (you can purchase a pulse oximetry at any drug store) - take medicine as prescribed - eat more whole grains, fruits and vegetables, lean meats and healthy fats and follow a low salt diet - follow rescue plan if symptoms flare-up (Follow your doctor instructions if you have shortness of breath and increase swelling) -  Call your doctor if you have questions or concerns. - call office if I gain more than 2 pounds in one day or 5 pounds in one week (call doctor for these symptoms) - do ankle pumps when sitting - keep legs up while sitting - watch for swelling in feet, ankles and legs every day (report increase to your doctor) - weigh yourself daily and record in log/ diary Follow Up Plan: The patient has been provided with contact information for the care management team and has been advised to call with any health related questions or concerns.  The care management team will reach out to the patient again over the next 30 days.      Care Plan : lymphedema  Updates made by Dannielle Karvonen, RN since 07/08/2020 12:00 AM  Problem: knowledge deficit related to long term care self management of lymphedema  and risk for falls   Priority: High  Long-Range Goal: Lymphedema symptoms minimized/ fall prevention   Start Date: 07/05/2020  Expected End Date: 10/27/2020  This Visit's Progress: On track  Priority: High  Current Barriers:   Knowledge deficits related to long term self health management of lymphedema and fall prevention:  70 year old with recent hospital admission on 05/28/2020 for Atrial fibrillation and congestive heart failure exacerbation. Concern for lymphedema. Patient  states she was seen by her cardiologist on 06/29/2020. She reports she had gain approximately 25 lbs and has lower extremity swelling. Patient denies wearing unna wraps or compression hose. Patient reports her cardiologist referred her to a vascular doctor.  Patient reports using a Rollator walker because she has difficulty walking. She reports having approximately 5-6 falls within the past year without injury.  Patient reports she has seen the vascular doctor and is scheduled to have an ultrasound in approximately 1 week.  Clinical Goal(s):  Marland Kitchen Collaboration with Venia Carbon, MD regarding development and update of comprehensive plan of care as evidenced by provider attestation and co-signature . Inter-disciplinary care team collaboration as needed (see longitudinal plan of care) . patient will demonstrate improved adherence to prescribed treatment plan for lymphedema and decreasing falls as evidenced by patient reporting and review of EMR  patient will work with care management team to address care coordination and chronic disease management needs   Patient will take her medications as prescribed.   Patient will attend scheduled medical appointments.  Interventions:   Evaluation of current treatment plan related to Atrial Fibrillation and Fall prevention  Collaboration with Venia Carbon, MD regarding development and update of comprehensive plan of care as evidenced by provider attestation       and  co-signature  Inter-disciplinary care team collaboration (see longitudinal plan of care)  Provided patient with verbal and written education material on lymphedema and falls prevention  Discussed plans with patient for ongoing care management follow up and provided patient with direct contact information for care management team  Medication review completed with patient.   Fall risk reduction strategies discussed with patient.   Reviewed scheduled / upcoming provider appointments.  Patient Goals: - utilize assistive device (Rollator) appropriately when ambulating. - Follow fall prevention recommendations:  Marland Kitchen Make sure there is good lighting throughout your home . Make sure walkways are clear of clutter, cords and throw rugs . Make sure furniture is secure, sturdy and does not swivel . Grab bars are suggested near the toilet, tub and shower . Turn on the lights when you go into a dark area.  Use night-  lights . Keep items that you use often in easy to reach places.  - Keep follow up appointments with your providers - Review education material for Lymphedema and fall prevention provided to you by your RN case manager.  - Elevate legs when sitting or lying down.  - Clean your skin daily. Apply lotion to prevent dry skin.  - Eat a diet rich in fruits and vegetables. - Contact your doctor for increased or new symptoms or concerns.  - Take your medications as prescribed.   Follow Up Plan: The patient has been provided with contact information for the care management team and has been advised to call with any health related questions or concerns.  The care management team will reach out to the patient again over the next 30 days.       Plan:The patient has been provided with contact information for the care management team and has been advised to call with any health related questions or concerns.  and The care management team will reach out to the patient again over the next 30  days.  Quinn Plowman RN,BSN,CCM RN Case Manager [place]  [phone]

## 2020-07-07 NOTE — Assessment & Plan Note (Signed)
PDMP reviewed No concerns 

## 2020-07-07 NOTE — Assessment & Plan Note (Signed)
Compensated to some degree Rate is controlled On torsemide, spironolactone, metolazone May be candidate for lymphedema pump

## 2020-07-07 NOTE — Assessment & Plan Note (Signed)
Ongoing issues--especially with back Continues on the oxycodone bid

## 2020-07-07 NOTE — Assessment & Plan Note (Signed)
Regular again on amiodarone and metoprolol Is on the eliquis

## 2020-07-07 NOTE — Assessment & Plan Note (Signed)
Renal function worse on extent of diuretics Will be monitored by cardiology or I will recheck next time

## 2020-07-07 NOTE — Assessment & Plan Note (Signed)
Mostly reactive depression No medications for now

## 2020-07-07 NOTE — Progress Notes (Signed)
Subjective:    Patient ID: Tina Pacheco, female    DOB: 1950/08/22, 70 y.o.   MRN: [id number]  HPI Here with husband--Lucky for follow up after hospitalization and rehab This visit occurred during the SARS-CoV-2 public health emergency.  Safety protocols were in place, including screening questions prior to the visit, additional usage of staff PPE, and extensive cleaning of exam room while observing appropriate contact time as indicated for disinfecting solutions.   Was hospitalized April 1---rapid atrial fibrillation CHF exacerbation Ongoing arthritis problems and arm symptoms Was diuresed Still on the eliquis--now on amiodarone  Has been on the cimzia since December Off this for now  Reviewed hospital records--then to [facility]  Weight still up despite the diuretics Reviewed Dr Donivan Scull note from last week Labs show low potassium-- GFR 34 Concern for lymphedema---going to vascular as well  Considering lymphedema pump  Some early SOB--even just sitting (better if she sits up straight) Sleeping in chair--no PND Did lose 20# since last week  Ongoing chronic pain On the oxycodone bid--mostly for back  Ongoing mood issues Gets down with her health issues but no daily symptoms No meds  Current Outpatient Medications on File Prior to Visit  Medication Sig Dispense Refill  . amiodarone (PACERONE) 200 MG tablet Take 1 tablet (200 mg total) by mouth daily. Take one tab daily. 30 tablet 11  . apixaban (ELIQUIS) 5 MG TABS tablet Take 1 tablet (5 mg total) by mouth 2 (two) times daily. 180 tablet 1  . atorvastatin (LIPITOR) 20 MG tablet TAKE 1 TABLET BY MOUTH DAILY AT 6 PM 90 tablet 3  . Certolizumab Pegol (CIMZIA Carlyss) Inject into the skin every 30 (thirty) days.    Stasia Cavalier (EUCRISA) 2 % OINT Apply to aa's BID PRN flares. 60 g 3  . gabapentin (NEURONTIN) 300 MG capsule Take 1 capsule (300 mg total) by mouth 4 (four) times daily. 120 capsule 11  . ketoconazole (NIZORAL) 2  % cream Apply 1 application topically 2 (two) times daily.    Marland Kitchen leflunomide (ARAVA) 20 MG tablet Take 20 mg by mouth daily.    . meclizine (ANTIVERT) 25 MG tablet TAKE 1 TABLET(25 MG) BY MOUTH THREE TIMES DAILY AS NEEDED FOR DIZZINESS 90 tablet 5  . metolazone (ZAROXOLYN) 2.5 MG tablet Take 2.5 mg by mouth daily as needed.    . metoprolol succinate (TOPROL-XL) 50 MG 24 hr tablet TAKE 1 TABLET BY MOUTH EVERY DAY WITH OR IMMEDIATELY FOLLOWING A MEAL 90 tablet 3  . montelukast (SINGULAIR) 10 MG tablet TAKE 1 TABLET BY MOUTH AT BEDTIME 90 tablet 3  . mupirocin ointment (BACTROBAN) 2 % Apply 1 application topically 2 (two) times daily. Qd to burn wounds 30 g 3  . nitroGLYCERIN (NITROSTAT) 0.4 MG SL tablet Place 1 tablet (0.4 mg total) under the tongue every 5 (five) minutes as needed for chest pain. 20 tablet 12  . nystatin (MYCOSTATIN/NYSTOP) powder Apply 1 application topically 3 (three) times daily as needed.    . Oxycodone HCl 20 MG TABS Take 1 tablet (20 mg total) by mouth in the morning and at bedtime. Substitute for prev rx 5 tablet 0  . polyethylene glycol (MIRALAX / GLYCOLAX) packet Take 17 g by mouth daily as needed for mild constipation. 14 each 0  . Potassium Chloride ER 20 MEQ TBCR Take 20 mEq by mouth in the morning and at bedtime. 180 tablet 3  . SF 5000 PLUS 1.1 % CREA dental cream Place 1  application onto teeth 2 (two) times daily.  1  . spironolactone (ALDACTONE) 25 MG tablet TAKE 1 TABLET(25 MG) BY MOUTH DAILY 30 tablet 11  . terazosin (HYTRIN) 10 MG capsule TAKE 1 CAPSULE(10 MG) BY MOUTH AT BEDTIME 90 capsule 3  . torsemide (DEMADEX) 20 MG tablet Take 40 mg by mouth daily. Takes an extra tablet when feeling SOB or has noticeable edema     No current facility-administered medications on file prior to visit.    Allergies  Allergen Reactions  . Fluoxetine     Headache, shaking, sleep issues  . Codeine     Nausea and vomiting/only when taking too much    Past Medical History:   Diagnosis Date  . Arthritis    RA  . Asthma   . Basal cell carcinoma   . Cataract 2018   bilateral eyes; corrected with surgery  . Claustrophobia   . Collagen vascular disease (HCC)    RA  . COPD (chronic obstructive pulmonary disease) ([place])   . Diastolic dysfunction    a. echo 07/2014: EF 55-60%, no RWMA, GR2DD, mild MR, LA moderately dilated, PASP 38 mm Hg  . Dyslipidemia   . Headache    migraines  . Hemihypertrophy   . History of cardiac cath    a. 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 History of gout   . Hyperplastic colonic polyp 2003  . Hypertension   . Hypokalemia   . Morbid obesity ([place])   . PAF (paroxysmal atrial fibrillation) (HCC)    a. on Pradaxa; b. CHADSVASc at least 2 (HTN & female)  . Rheumatoid arthritis(714.0)   . Sleep apnea    a. not compliant with CPAP    Past Surgical History:  Procedure Laterality Date  . ABDOMINAL HYSTERECTOMY    . APPLICATION OF WOUND VAC Right 08/09/2017   Procedure: APPLICATION OF WOUND VAC;  Surgeon: Albertine Patricia, DPM;  Location: [facility];  Service: Podiatry;  Laterality: Right;  . CARDIAC CATHETERIZATION  05/24/2010   nonobstructive CAD  . CATARACT EXTRACTION W/ INTRAOCULAR LENS IMPLANT Right 06/12/2016   Dr. Darleen Crocker  . CATARACT EXTRACTION W/ INTRAOCULAR LENS IMPLANT Left 06/26/2016   Dr. Darleen Crocker  . CESAREAN SECTION    . CHOLECYSTECTOMY    . COLONOSCOPY  06/12/2011   Procedure: COLONOSCOPY;  Surgeon: Juanita Craver, MD;  Location: [facility];  Service: Endoscopy;  Laterality: N/A;  . COLONOSCOPY N/A 03/17/2013   Procedure: COLONOSCOPY;  Surgeon: Juanita Craver, MD;  Location: [facility];  Service: Endoscopy;  Laterality: N/A;  . EYE SURGERY    . FOOT ARTHRODESIS Right 07/13/2017   Procedure: ARTHRODESIS FOOT-MULTI.FUSIONS (6 JOINTS);  Surgeon: Albertine Patricia, DPM;  Location: [facility];  Service: Podiatry;  Laterality: Right;  . IRRIGATION AND DEBRIDEMENT FOOT Right 08/09/2017   Procedure: IRRIGATION  AND DEBRIDEMENT FOOT;  Surgeon: Albertine Patricia, DPM;  Location: [facility];  Service: Podiatry;  Laterality: Right;  . KNEE ARTHROSCOPY     bilateral  . LEFT HEART CATH AND CORONARY ANGIOGRAPHY N/A 06/04/2020   Procedure: LEFT HEART CATH AND CORONARY ANGIOGRAPHY;  Surgeon: Wellington Hampshire, MD;  [facility];  Service: Cardiovascular;  Laterality: N/A;  . SINUSOTOMY    . TOOTH EXTRACTION  12/2016  . VAGINAL HYSTERECTOMY      Family History  Problem Relation Age of Onset  . Emphysema Father        smoked  . Arthritis Father   . Tuberculosis Mother   .  Parkinsonism Mother   . Cancer Sister   . Diabetes type II Sister   . Breast cancer Sister   . Breast cancer Maternal Aunt   . Tuberculosis Sister     Social History   Socioeconomic History  . Marital status: Married    Spouse name: Not on file  . Number of children: 1  . Years of education: Not on file  . Highest education level: Not on file  Occupational History  . 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 Smoking status: Never Smoker  . Smokeless tobacco: Never Used  Vaping Use  . Vaping Use: Never used  Substance and Sexual Activity  . Alcohol use: No  . Drug use: No  . Sexual activity: Not Currently  Other Topics Concern  . Not on file  Social History Narrative   No living will   Husband, then daughter should be decision maker   Would accept resuscitation attempts   Not sure about tube feeds   Social Determinants of Health   Financial Resource Strain: Not on file  Food Insecurity: No Food Insecurity  . Worried About Charity fundraiser in the Last Year: Never true  . Ran Out of Food in the Last Year: Never true  Transportation Needs: No Transportation Needs  . Lack of Transportation (Medical): No  . Lack of Transportation (Non-Medical): No  Physical Activity: Not on file  Stress: Not on file  Social Connections: Not on file  Intimate  Partner Violence: Not on file   Review of Systems Concern about sleep apnea--considering home test Appetite is okay---no salt Husband handles cooking, shopping, instrumental ADLs    Objective:   Physical Exam Constitutional:      Appearance: She is obese.  Cardiovascular:     Rate and Rhythm: Normal rate and regular rhythm.     Heart sounds: No murmur heard. No gallop.   Pulmonary:     Effort: Pulmonary effort is normal.     Breath sounds: Normal breath sounds. No wheezing or rales.  Musculoskeletal:     Cervical back: Neck supple.     Comments: 4+ non pitting edema bilaterally  Lymphadenopathy:     Cervical: No cervical adenopathy.  Neurological:     Mental Status: She is alert.  Psychiatric:        Mood and Affect: Mood normal.        Behavior: Behavior normal.     Comments: Frustrated but no clearly depressed            Assessment & Plan:

## 2020-07-08 NOTE — Patient Instructions (Addendum)
Visit Information:  Thank you for taking the time to speak with me today.   PATIENT GOALS:  Goals Addressed    . Management of lymphedema and falls   On track    Timeframe:  Long-Range Goal Priority:  High Start Date: 07/06/2020                            Expected End Date:  10/27/2020  Follow up: 08/05/2020  Patient Goals:      - utilize assistive device (Rollator) appropriately when ambulating. - Follow fall prevention recommendations:  Marland Kitchen Make sure there is good lighting throughout your home . Make sure walkways are clear of clutter, cords and throw rugs . Make sure furniture is secure, sturdy and does not swivel . Grab bars are suggested near the toilet, tub and shower . Turn on the lights when you go into a dark area.  Use night- lights . Keep items that you use often in easy to reach places.  - Keep follow up appointments with your providers - Review education material for Lymphedema and fall prevention provided to you by your RN case manager.  - Elevate legs when sitting or lying down.  - Clean your skin daily. Apply lotion to prevent dry skin.  - Eat a diet rich in fruits and vegetables. - Contact your doctor for increased or new symptoms or concerns.  - Take your medications as prescribed.                     . Track and Manage Symptoms related to Atrial fibrillation and heart failure   On track    Timeframe:  Long-Range Goal Priority:  High Start Date:  07/06/2020                     Expected End Date:  10/27/2020                  Follow Up Date 08/05/2020  Patient Goals: - check pulse (heart) rate once a day (you can purchase a pulse oximetry at any drug store) - take medicine as prescribed - eat more whole grains, fruits and vegetables, lean meats and healthy fats and follow a low salt diet - follow rescue plan if symptoms flare-up (Follow your doctor instructions if you have shortness of breath and increase swelling) - Call your doctor if you have questions or  concerns. - call office if I gain more than 2 pounds in one day or 5 pounds in one week (call doctor for these symptoms) - do ankle pumps when sitting - keep legs up while sitting - watch for swelling in feet, ankles and legs every day (report increase to your doctor) - weigh yourself daily and record in log/ diary   Why is this important?   You will be able to handle your symptoms better if you keep track of them.  Making some simple changes to your lifestyle will help.  Eating healthy is one thing you can do to take good care of yourself.      Lymphedema  Lymphedema is swelling that is caused by the abnormal collection of lymph in the tissues under the skin. Lymph is excess fluid from the tissues in your body that is removed through the lymphatic system. This system is part of your body's defense system (immune system) and includes lymph nodes and lymph vessels. The lymph vessels collect and carry the  excess fluid, fats, proteins, and waste from the tissues of the body to the bloodstream. This system also works to clean and remove bacteria and waste products from the body. Lymphedema occurs when the lymphatic system is blocked. When the lymph vessels or lymph nodes are blocked or damaged, lymph does not drain properly. This causes an abnormal buildup of lymph, which leads to swelling in the affected area. This may include the trunk area, or an arm or leg. Lymphedema cannot be cured by medicines, but various methods can be used to help reduce the swelling. What are the causes? The cause of this condition depends on the type of lymphedema that you have.  Primary lymphedema is caused by the absence of lymph vessels or having abnormal lymph vessels at birth.  Secondary lymphedema occurs when lymph vessels are blocked or damaged. Secondary lymphedema is more common. Common causes of lymph vessel blockage include: ? Skin infection, such as cellulitis. ? Infection by parasites  (filariasis). ? Injury. ? Radiation therapy. ? Cancer. ? Formation of scar tissue. ? Surgery. What are the signs or symptoms? Symptoms of this condition include:  Swelling of the arm or leg.  A heavy or tight feeling in the arm or leg.  Swelling of the feet, toes, or fingers. Shoes or rings may fit more tightly than before.  Redness of the skin over the affected area.  Limited movement of the affected limb.  Sensitivity to touch or discomfort in the affected limb. How is this diagnosed? This condition may be diagnosed based on:  Your symptoms and medical history.  A physical exam.  Bioimpedance spectroscopy. In this test, painless electrical currents are used to measure fluid levels in your body.  Imaging tests, such as: ? MRI. ? CT scan. ? Duplex ultrasound. This test uses sound waves to produce images of the vessels and the blood flow on a screen. ? Lymphoscintigraphy. In this test, a low dose of a radioactive substance is injected to trace the flow of lymph through your lymph vessels. ? Lymphangiography. In this test, a contrast dye is injected into the lymph vessel to help show blockages. How is this treated? If an underlying condition is causing the lymphedema, that condition will be treated. For example, antibiotic medicines may be used to treat an infection. Treatment for this condition will depend on the cause of your lymphedema. Treatment may include:  Complete decongestive therapy (CDT). This is done by a certified lymphedema therapist to reduce fluid congestion. This therapy includes: ? Skin care. ? Compression wrapping of the affected area. ? Manual lymph drainage. This is a special massage technique that promotes lymph drainage out of a limb. ? Specific exercises. Certain exercises can help fluid move out of the affected limb.  Compression. Various methods may be used to apply pressure to the affected limb to reduce the swelling. They include: ? Wearing  compression stockings or sleeves on the affected limb. ? Wrapping the affected limb with special bandages.  Surgery. This is usually done for severe cases only. For example, surgery may be done if you have trouble moving the limb or if the swelling does not get better with other treatments.   Follow these instructions at home: Self-care  The affected area is more likely to become injured or infected. Take these steps to help prevent infection: ? Keep the affected area clean and dry. ? Use approved creams or lotions to keep the skin moisturized. ? Protect your skin from cuts:  Use gloves while  cooking or gardening.  Do not walk barefoot.  If you shave the affected area, use an Copy.  Do not wear tight clothes, shoes, or jewelry.  Eat a healthy diet that includes a lot of fruits and vegetables. Activity  Do exercises as told by your health care provider.  Do not sit with your legs crossed.  When possible, keep the affected limb raised (elevated) above the level of your heart.  Avoid carrying things with an arm that is affected by lymphedema. General instructions  Wear compression stockings or sleeves as told by your health care provider.  Note any changes in size of the affected limb. You may be instructed to take regular measurements and keep track of them.  Take over-the-counter and prescription medicines only as told by your health care provider.  If you were prescribed an antibiotic medicine, take or apply it as told by your health care provider. Do not stop using the antibiotic even if you start to feel better or if your condition improves.  Do not use heating pads or ice packs on the affected area.  Avoid having blood draws, IV insertions, or blood pressure checks on the affected limb.  Keep all follow-up visits. This is important. Contact a health care provider if you:  Continue to have swelling in your limb.  Have fluid leaking from the skin of your  swollen limb.  Have a cut that does not heal.  Have redness or pain in the affected area.  Develop purplish spots, rash, blisters, or sores (lesions) on your affected limb. Get help right away if you:  Have new swelling in your limb that starts suddenly.  Have shortness of breath or chest pain.  Have a fever or chills. These symptoms may represent a serious problem that is an emergency. Do not wait to see if the symptoms will go away. Get medical help right away. Call your local emergency services (911 in the [place] Do not drive yourself to the hospital. Summary  Lymphedema is swelling that is caused by the abnormal collection of lymph in the tissues under the skin.  Lymph is fluid from the tissues in your body that is removed through the lymphatic system. This system collects and carries excess fluid, fats, proteins, and wastes from the tissues of the body to the bloodstream.  Lymphedema causes swelling, pain, and redness in the affected area. This may include the trunk area, or an arm or leg.  Treatment for this condition may depend on the cause of your lymphedema. Treatment may include treating the underlying cause, complete decongestive therapy (CDT), compression methods, or surgery. This information is not intended to replace advice given to you by your health care provider. Make sure you discuss any questions you have with your health care provider. Document Revised: 12/10/2019 Document Reviewed: 12/10/2019 Elsevier Patient Education  2021 [place] Prevention in the Home, Adult Falls can cause injuries and can happen to people of all ages. There are many things you can do to make your home safe and to help prevent falls. Ask for help when making these changes. What actions can I take to prevent falls? General Instructions  Use good lighting in all rooms. Replace any light bulbs that burn out.  Turn on the lights in dark areas. Use night-lights.  Keep items that  you use often in easy-to-reach places. Lower the shelves around your home if needed.  Set up your furniture so you have a clear path. Avoid moving  your furniture around.  Do not have throw rugs or other things on the floor that can make you trip.  Avoid walking on wet floors.  If any of your floors are uneven, fix them.  Add color or contrast paint or tape to clearly mark and help you see: ? Grab bars or handrails. ? First and last steps of staircases. ? Where the edge of each step is.  If you use a stepladder: ? Make sure that it is fully opened. Do not climb a closed stepladder. ? Make sure the sides of the stepladder are locked in place. ? Ask someone to hold the stepladder while you use it.  Know where your pets are when moving through your home. What can I do in the bathroom?  Keep the floor dry. Clean up any water on the floor right away.  Remove soap buildup in the tub or shower.  Use nonskid mats or decals on the floor of the tub or shower.  Attach bath mats securely with double-sided, nonslip rug tape.  If you need to sit down in the shower, use a plastic, nonslip stool.  Install grab bars by the toilet and in the tub and shower. Do not use towel bars as grab bars.      What can I do in the bedroom?  Make sure that you have a light by your bed that is easy to reach.  Do not use any sheets or blankets for your bed that hang to the floor.  Have a firm chair with side arms that you can use for support when you get dressed. What can I do in the kitchen?  Clean up any spills right away.  If you need to reach something above you, use a step stool with a grab bar.  Keep electrical cords out of the way.  Do not use floor polish or wax that makes floors slippery. What can I do with my stairs?  Do not leave any items on the stairs.  Make sure that you have a light switch at the top and the bottom of the stairs.  Make sure that there are handrails on both sides  of the stairs. Fix handrails that are broken or loose.  Install nonslip stair treads on all your stairs.  Avoid having throw rugs at the top or bottom of the stairs.  Choose a carpet that does not hide the edge of the steps on the stairs.  Check carpeting to make sure that it is firmly attached to the stairs. Fix carpet that is loose or worn. What can I do on the outside of my home?  Use bright outdoor lighting.  Fix the edges of walkways and driveways and fix any cracks.  Remove anything that might make you trip as you walk through a door, such as a raised step or threshold.  Trim any bushes or trees on paths to your home.  Check to see if handrails are loose or broken and that both sides of all steps have handrails.  Install guardrails along the edges of any raised decks and porches.  Clear paths of anything that can make you trip, such as tools or rocks.  Have leaves, snow, or ice cleared regularly.  Use sand or salt on paths during winter.  Clean up any spills in your garage right away. This includes grease or oil spills. What other actions can I take?  Wear shoes that: ? Have a low heel. Do not wear high heels. ?  Have rubber bottoms. ? Feel good on your feet and fit well. ? Are closed at the toe. Do not wear open-toe sandals.  Use tools that help you move around if needed. These include: ? Canes. ? Walkers. ? Scooters. ? Crutches.  Review your medicines with your doctor. Some medicines can make you feel dizzy. This can increase your chance of falling. Ask your doctor what else you can do to help prevent falls. Where to find more information  Centers for Disease Control and Prevention, [employer]: http://www.wolf.info/  National Institute on Aging: http://kim-miller.com/ Contact a doctor if:  You are afraid of falling at home.  You feel weak, drowsy, or dizzy at home.  You fall at home. Summary  There are many simple things that you can do to make your home safe and to help  prevent falls.  Ways to make your home safe include removing things that can make you trip and installing grab bars in the bathroom.  Ask for help when making these changes in your home. This information is not intended to replace advice given to you by your health care provider. Make sure you discuss any questions you have with your health care provider. Document Revised: 09/17/2019 Document Reviewed: 09/17/2019 Elsevier Patient Education  2021 [place].   Consent to CCM Services: Ms. Waldorf was given information about Chronic Care Management services today including:  1. CCM service includes personalized support from designated clinical staff supervised by her physician, including individualized plan of care and coordination with other care providers 2. 24/7 contact phone numbers for assistance for urgent and routine care needs. 3. Service will only be billed when office clinical staff spend 20 minutes or more in a month to coordinate care. 4. Only one practitioner may furnish and bill the service in a calendar month. 5. The patient may stop CCM services at any time (effective at the end of the month) by phone call to the office staff. 6. The patient will be responsible for cost sharing (co-pay) of up to 20% of the service fee (after annual deductible is met).  Patient agreed to services and verbal consent obtained.   Patient verbalizes understanding of instructions provided today and agrees to view in [place].   The patient has been provided with contact information for the care management team and has been advised to call with any health related questions or concerns.  The care management team will reach out to the patient again over the next 30 days.   Quinn Plowman RN,BSN,CCM RN Case Manager [facility]  [phone]    CLINICAL CARE PLAN: Patient Care Plan: Cardiovascular  Problem Identified: Knowledge deficit related to long term care management of heart  failure and atrial fibrillation.   Priority: High  Long-Range Goal: Symptom exacerbation for atrial fibrillation/ heart failure prevented/ minimized   Start Date: 01/08/2020  Expected End Date: 10/27/2020  This Visit's Progress: On track  Priority: High  Current Barriers: 70 year old female with history of paroxysmal A. fib on Eliquis and diastolic CHF.  Patient reports she was hospitalized from 05/28/20 to 06/08/20 due to A. Fib then transitioned to Skilled nursing facility from 06/08/20 to 06/29/2020. Patient states she currently has home health services for nursing, PT and OT. She states she has difficulty walking and uses a Rollator for assistance.  Patient states she is followed by her primary care provider, cardiologist and vascular doctor.  She report her next follow up visit with her primary care provider is 07/07/2020. Patient states  she has transportation to her appointments and takes her medications as prescribed. She reports weighing and recording weights daily. Patient states she is has difficulty using her cuff blood pressure monitor. She does not feel she is using it correctly.   RNCM advised patient to have the home health nurse demonstrate how to use the blood pressure monitor. Patient verbalized agreement.  . Knowledge deficit related to basic pathophysiology and self care management of heart failure and atrial fibrillation . No Advanced Directives in place:  Advanced directive discussed and packet mailed to patient.  Nurse Case Manager Clinical Goal(s):   patient will take all medications as prescribed and will call provider and / or RN care manager for medication related questions  patient will verbalize understanding of Afib and heart failure Action Plan and when to call doctor  patient will attend scheduled medical appointments:  Patient reports she had an initial visit with the vascular doctor on 07/05/2020 with next visit being on 07/13/2020.  Patient report her next cardiology follow  visit is scheduled for 07/30/2020.   patient will work with CM clinical Education officer, [employer] regarding anxiety/ depression symptoms.   the patient will demonstrate ongoing self health care management ability as evidenced by weighing and recording weights daily, checking blood pressure daily, taking medications as prescribed, and adhering to a heart healthy low sodium diet.   the patient will work with the care management team towards completion of advanced directives Interventions:  . Collaboration with Venia Carbon, MD regarding development and update of comprehensive plan of care as evidenced by provider attestation and co-signature . Inter-disciplinary care team collaboration (see longitudinal plan of care) . Discussed basic overview of afib and heart failure disease state provided . Reviewed and discussed medications  . Written and verbal education provided regarding Heart failure and Afib action plan. . Referred patient to social worker for anxiety/ depression symptoms.  . Written education information provided to patient on heart healthy diet low sodium diet. . Discussed importance of daily weight and advised patient to weigh and record daily . Advised patient, providing education and rationale, to monitor blood pressure daily and record. Contact doctor for concerns.  . Reviewed scheduled/upcoming provider appointments. . Discussed plans with patient for ongoing care management follow up and provided patient with direct contact information for care management team. Self-Care Activities: Self administers medications as prescribed.  Attends all scheduled provider appointments Calls pharmacy for medication refills Calls provider office for new concerns or questions Patient Goals: - check pulse (heart) rate once a day (you can purchase a pulse oximetry at any drug store) - take medicine as prescribed - eat more whole grains, fruits and vegetables, lean meats and healthy fats and follow a low  salt diet - follow rescue plan if symptoms flare-up (Follow your doctor instructions if you have shortness of breath and increase swelling) - Call your doctor if you have questions or concerns. - call office if I gain more than 2 pounds in one day or 5 pounds in one week (call doctor for these symptoms) - do ankle pumps when sitting - keep legs up while sitting - watch for swelling in feet, ankles and legs every day (report increase to your doctor) - weigh yourself daily and record in log/ diary Follow Up Plan: The patient has been provided with contact information for the care management team and has been advised to call with any health related questions or concerns.  The care management team will reach out to the  patient again over the next 30 days.      Patient Care Plan: lymphedema  Problem Identified: knowledge deficit related to long term care self management of lymphedema and risk for falls   Priority: High  Long-Range Goal: Lymphedema symptoms minimized/ fall prevention   Start Date: 07/05/2020  Expected End Date: 10/27/2020  This Visit's Progress: On track  Priority: High  Current Barriers:   Knowledge deficits related to long term self health management of lymphedema and fall prevention:  70 year old with recent hospital admission on 05/28/2020 for Atrial fibrillation and congestive heart failure exacerbation. Concern for lymphedema. Patient  states she was seen by her cardiologist on 06/29/2020. She reports she had gain approximately 25 lbs and has lower extremity swelling. Patient denies wearing unna wraps or compression hose. Patient reports her cardiologist referred her to a vascular doctor.  Patient reports using a Rollator walker because she has difficulty walking. She reports having approximately 5-6 falls within the past year without injury.  Patient reports she has seen the vascular doctor and is scheduled to have an ultrasound in approximately 1 week.  Clinical Goal(s):   Marland Kitchen Collaboration with Venia Carbon, MD regarding development and update of comprehensive plan of care as evidenced by provider attestation and co-signature . Inter-disciplinary care team collaboration as needed (see longitudinal plan of care) . patient will demonstrate improved adherence to prescribed treatment plan for lymphedema and decreasing falls as evidenced by patient reporting and review of EMR  patient will work with care management team to address care coordination and chronic disease management needs   Patient will take her medications as prescribed.   Patient will attend scheduled medical appointments.  Interventions:   Evaluation of current treatment plan related to Atrial Fibrillation and Fall prevention  Collaboration with Venia Carbon, MD regarding development and update of comprehensive plan of care as evidenced by provider attestation       and co-signature  Inter-disciplinary care team collaboration (see longitudinal plan of care)  Provided patient with verbal and written education material on lymphedema and falls prevention  Discussed plans with patient for ongoing care management follow up and provided patient with direct contact information for care management team  Medication review completed with patient.   Fall risk reduction strategies discussed with patient.   Reviewed scheduled / upcoming provider appointments.  Patient Goals: - utilize assistive device (Rollator) appropriately when ambulating. - Follow fall prevention recommendations:  Marland Kitchen Make sure there is good lighting throughout your home . Make sure walkways are clear of clutter, cords and throw rugs . Make sure furniture is secure, sturdy and does not swivel . Grab bars are suggested near the toilet, tub and shower . Turn on the lights when you go into a dark area.  Use night- lights . Keep items that you use often in easy to reach places.  - Keep follow up appointments with your  providers - Review education material for Lymphedema and fall prevention provided to you by your RN case manager.  - Elevate legs when sitting or lying down.  - Clean your skin daily. Apply lotion to prevent dry skin.  - Eat a diet rich in fruits and vegetables. - Contact your doctor for increased or new symptoms or concerns.  - Take your medications as prescribed.   Follow Up Plan: The patient has been provided with contact information for the care management team and has been advised to call with any health related questions or concerns.  The care management team will reach out to the patient again over the next 30 days.

## 2020-07-09 ENCOUNTER — Telehealth: Payer: Self-pay | Admitting: Internal Medicine

## 2020-07-09 ENCOUNTER — Other Ambulatory Visit (INDEPENDENT_AMBULATORY_CARE_PROVIDER_SITE_OTHER): Payer: Self-pay | Admitting: Nurse Practitioner

## 2020-07-09 DIAGNOSIS — I890 Lymphedema, not elsewhere classified: Secondary | ICD-10-CM

## 2020-07-09 NOTE — Telephone Encounter (Signed)
Okay I did just see her in the office

## 2020-07-09 NOTE — Telephone Encounter (Signed)
Home Health verbal orders-caller/Agency: [facility]   Callback number: [phone]   Requesting OT/PT/Skilled nursing/Social Work/Speech: PT, nursing  Reason: she was in rehab at [facility] place   Frequency: nursing 1w6 , PT 1w1,2w3 1w5

## 2020-07-09 NOTE — Telephone Encounter (Signed)
Verbal orders given to Maggie at [employer].

## 2020-07-12 ENCOUNTER — Encounter (INDEPENDENT_AMBULATORY_CARE_PROVIDER_SITE_OTHER): Payer: Medicare Other

## 2020-07-13 ENCOUNTER — Other Ambulatory Visit: Payer: Self-pay

## 2020-07-13 ENCOUNTER — Encounter (INDEPENDENT_AMBULATORY_CARE_PROVIDER_SITE_OTHER): Payer: Self-pay | Admitting: Nurse Practitioner

## 2020-07-13 ENCOUNTER — Ambulatory Visit (INDEPENDENT_AMBULATORY_CARE_PROVIDER_SITE_OTHER): Payer: Medicare Other

## 2020-07-13 ENCOUNTER — Ambulatory Visit (INDEPENDENT_AMBULATORY_CARE_PROVIDER_SITE_OTHER): Payer: Medicare Other | Admitting: Nurse Practitioner

## 2020-07-13 VITALS — BP 188/77 | HR 60 | Resp 18 | Wt 323.4 lb

## 2020-07-13 DIAGNOSIS — I4891 Unspecified atrial fibrillation: Secondary | ICD-10-CM | POA: Diagnosis not present

## 2020-07-13 DIAGNOSIS — I890 Lymphedema, not elsewhere classified: Secondary | ICD-10-CM

## 2020-07-13 DIAGNOSIS — I5030 Unspecified diastolic (congestive) heart failure: Secondary | ICD-10-CM | POA: Diagnosis not present

## 2020-07-13 DIAGNOSIS — N1832 Chronic kidney disease, stage 3b: Secondary | ICD-10-CM

## 2020-07-13 DIAGNOSIS — I110 Hypertensive heart disease with heart failure: Secondary | ICD-10-CM | POA: Diagnosis not present

## 2020-07-13 DIAGNOSIS — I2510 Atherosclerotic heart disease of native coronary artery without angina pectoris: Secondary | ICD-10-CM | POA: Diagnosis not present

## 2020-07-13 DIAGNOSIS — I5033 Acute on chronic diastolic (congestive) heart failure: Secondary | ICD-10-CM | POA: Diagnosis not present

## 2020-07-13 DIAGNOSIS — J9601 Acute respiratory failure with hypoxia: Secondary | ICD-10-CM | POA: Diagnosis not present

## 2020-07-13 NOTE — Progress Notes (Signed)
Subjective:    Patient ID: Tina Pacheco, female    DOB: [birth year], 70 y.o.   MRN: [id number] Chief Complaint  Patient presents with  . New Patient (Initial Visit)    Ref Gollan lymphedema needs bilateral leg pumps    Tina Pacheco is a 70 year old woman that presents today for worsening leg swelling.  The patient's cardiologist, Dr. Rockey Situ has referred her for possible lymphedema pumps.  The patient's daughter gives much of the history but the patient has been struggling with maintaining her weight due to her atrial fibrillation and heart failure.  They note that she will get down to about 300 pounds with the use of fluid pills and be stable for a short time before her weight increased as high as 350 pounds.  She then has to be diuresed and her weight goes down once again.  The patient elevates her lower extremities is much as possible.  We have previously seen the patient in 2019 and at that time she had good control of her lower extremity edema.   Review of Systems  Cardiovascular: Positive for leg swelling.  Neurological: Positive for weakness.  All other systems reviewed and are negative.      Objective:   Physical Exam Vitals reviewed.  Constitutional:      Appearance: She is obese.  HENT:     Head: Normocephalic.  Cardiovascular:     Rate and Rhythm: Normal rate.  Musculoskeletal:     Right lower leg: Edema present.     Left lower leg: Edema present.  Neurological:     Mental Status: She is alert and oriented to person, place, and time.     Motor: Weakness present.     Gait: Gait abnormal.  Psychiatric:        Mood and Affect: Mood normal.        Behavior: Behavior normal.        Thought Content: Thought content normal.        Judgment: Judgment normal.     BP (!) 180/76 (BP Location: Left Arm)   Pulse 60   Resp 17   Ht 5\' 3"  (1.6 m)   Wt (!) 340 lb (154.2 kg)   BMI 60.23 kg/m   Past Medical History:  Diagnosis Date  . Arthritis    RA  . Asthma   .  Basal cell carcinoma   . Cataract 2018   bilateral eyes; corrected with surgery  . Claustrophobia   . Collagen vascular disease (HCC)    RA  . COPD (chronic obstructive pulmonary disease) ([place])   . Diastolic dysfunction    a. echo 07/2014: EF 55-60%, no RWMA, GR2DD, mild MR, LA moderately dilated, PASP 38 mm Hg  . Dyslipidemia   . Headache    migraines  . Hemihypertrophy   . History of cardiac cath    a. 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 History of gout   . Hyperplastic colonic polyp 2003  . Hypertension   . Hypokalemia   . Morbid obesity ([place])   . PAF (paroxysmal atrial fibrillation) (HCC)    a. on Pradaxa; b. CHADSVASc at least 2 (HTN & female)  . Rheumatoid arthritis(714.0)   . Sleep apnea    a. not compliant with CPAP    Social History   Socioeconomic History  . Marital status: Married    Spouse name: Not on file  . Number of children: 1  . Years of education: Not on file  .  Highest education level: Not on file  Occupational History  . 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 Smoking status: Never Smoker  . Smokeless tobacco: Never Used  Vaping Use  . Vaping Use: Never used  Substance and Sexual Activity  . Alcohol use: No  . Drug use: No  . Sexual activity: Not Currently  Other Topics Concern  . Not on file  Social History Narrative   No living will   Husband, then daughter should be decision maker   Would accept resuscitation attempts   Not sure about tube feeds   Social Determinants of Health   Financial Resource Strain: Not on file  Food Insecurity: No Food Insecurity  . Worried About Charity fundraiser in the Last Year: Never true  . Ran Out of Food in the Last Year: Never true  Transportation Needs: No Transportation Needs  . Lack of Transportation (Medical): No  . Lack of Transportation (Non-Medical): No  Physical Activity: Not on file  Stress: Not on file  Social Connections: Not  on file  Intimate Partner Violence: Not on file    Past Surgical History:  Procedure Laterality Date  . ABDOMINAL HYSTERECTOMY    . APPLICATION OF WOUND VAC Right 08/09/2017   Procedure: APPLICATION OF WOUND VAC;  Surgeon: Albertine Patricia, DPM;  Location: [facility] ORS;  Service: Podiatry;  Laterality: Right;  . CARDIAC CATHETERIZATION  05/24/2010   nonobstructive CAD  . CATARACT EXTRACTION W/ INTRAOCULAR LENS IMPLANT Right 06/12/2016   Dr. Darleen Crocker  . CATARACT EXTRACTION W/ INTRAOCULAR LENS IMPLANT Left 06/26/2016   Dr. Darleen Crocker  . CESAREAN SECTION    . CHOLECYSTECTOMY    . COLONOSCOPY  06/12/2011   Procedure: COLONOSCOPY;  Surgeon: Juanita Craver, MD;  Location: [facility];  Service: Endoscopy;  Laterality: N/A;  . COLONOSCOPY N/A 03/17/2013   Procedure: COLONOSCOPY;  Surgeon: Juanita Craver, MD;  Location: [facility];  Service: Endoscopy;  Laterality: N/A;  . EYE SURGERY    . FOOT ARTHRODESIS Right 07/13/2017   Procedure: ARTHRODESIS FOOT-MULTI.FUSIONS (6 JOINTS);  Surgeon: Albertine Patricia, DPM;  Location: [facility];  Service: Podiatry;  Laterality: Right;  . IRRIGATION AND DEBRIDEMENT FOOT Right 08/09/2017   Procedure: IRRIGATION AND DEBRIDEMENT FOOT;  Surgeon: Albertine Patricia, DPM;  Location: [facility];  Service: Podiatry;  Laterality: Right;  . KNEE ARTHROSCOPY     bilateral  . LEFT HEART CATH AND CORONARY ANGIOGRAPHY N/A 06/04/2020   Procedure: LEFT HEART CATH AND CORONARY ANGIOGRAPHY;  Surgeon: Wellington Hampshire, MD;  Location: [facility];  Service: Cardiovascular;  Laterality: N/A;  . SINUSOTOMY    . TOOTH EXTRACTION  12/2016  . VAGINAL HYSTERECTOMY      Family History  Problem Relation Age of Onset  . Emphysema Father        smoked  . Arthritis Father   . Tuberculosis Mother   . Parkinsonism Mother   . Cancer Sister   . Diabetes type II Sister   . Breast cancer Sister   . Breast cancer Maternal Aunt   . Tuberculosis Sister     Allergies  Allergen  Reactions  . Fluoxetine     Headache, shaking, sleep issues  . Codeine     Nausea and vomiting/only when taking too much    CBC Latest Ref Rng & Units 06/07/2020 06/04/2020 06/03/2020  WBC 4.0 - 10.5 K/uL 10.0 9.0 7.1  Hemoglobin 12.0 - 15.0  g/dL 12.3 12.8 12.7  Hematocrit 36.0 - 46.0 % 37.7 39.0 39.6  Platelets 150 - 400 K/uL 187 211 200      CMP     Component Value Date/Time   NA 139 06/29/2020 1028   NA 141 03/29/2014 0522   K 3.0 (L) 06/29/2020 1028   K 3.6 03/29/2014 0522   CL 92 (L) 06/29/2020 1028   CL 111 (H) 03/29/2014 0522   CO2 26 06/29/2020 1028   CO2 24 03/29/2014 0522   GLUCOSE 95 06/29/2020 1028   GLUCOSE 112 (H) 06/23/2020 1236   GLUCOSE 104 (H) 03/29/2014 0522   BUN 37 (H) 06/29/2020 1028   BUN 11 03/29/2014 0522   CREATININE 1.62 (H) 06/29/2020 1028   CREATININE 0.90 03/29/2014 0522   CREATININE 0.84 10/26/2010 1037   CALCIUM 10.2 06/29/2020 1028   CALCIUM 9.2 03/29/2014 0522   PROT 6.3 06/29/2020 1028   PROT 6.8 03/28/2014 1032   ALBUMIN 4.0 06/29/2020 1028   ALBUMIN 3.1 (L) 03/28/2014 1032   AST 27 06/29/2020 1028   AST 35 03/28/2014 1032   ALT 17 06/29/2020 1028   ALT 18 03/28/2014 1032   ALKPHOS 107 06/29/2020 1028   ALKPHOS 92 03/28/2014 1032   BILITOT 1.1 06/29/2020 1028   BILITOT 0.9 03/28/2014 1032   GFRNONAA 33 (L) 06/23/2020 1236   GFRNONAA >60 03/29/2014 0522   GFRAA >60 04/04/2019 0539   GFRAA >60 03/29/2014 0522     No results found.     Assessment & Plan:   1. Stage 3b chronic kidney disease ([place]) This is also another contributing factor to her fluid retention  2. Diastolic heart failure, unspecified HF chronicity (HCC) A large contributing factor to her ongoing fluid retention.  We will continue to follow cardiology  3. Lymphedema We previously saw the patient in 2019.  And conservative therapy was advised.  The patient has had worsening swelling since.  While the patient's lymphedema may certainly be exacerbating her  lower extremity swelling it is not the cause of her gaining and retaining fluid.  That is largely related to her heart failure and her chronic kidney disease.  We will repeat noninvasive testing to ensure that there is no chronic venous insufficiency contributing to her swelling.  Otherwise we will have a discussion about a lymphedema pump.  However, it was discussed today that a lymphedema pump may not be in the patient's best interest because her heart failure has been largely uncontrolled and typically lymphedema pump is contraindicated for uncontrolled heart failure.   Current Outpatient Medications on File Prior to Visit  Medication Sig Dispense Refill  . amiodarone (PACERONE) 200 MG tablet Take 1 tablet (200 mg total) by mouth daily. Take one tab daily. 30 tablet 11  . apixaban (ELIQUIS) 5 MG TABS tablet Take 1 tablet (5 mg total) by mouth 2 (two) times daily. 180 tablet 1  . atorvastatin (LIPITOR) 20 MG tablet TAKE 1 TABLET BY MOUTH DAILY AT 6 PM 90 tablet 3  . Certolizumab Pegol (CIMZIA [place]) Inject into the skin every 30 (thirty) days.    Stasia Cavalier (EUCRISA) 2 % OINT Apply to aa's BID PRN flares. 60 g 3  . gabapentin (NEURONTIN) 300 MG capsule Take 1 capsule (300 mg total) by mouth 4 (four) times daily. 120 capsule 11  . ketoconazole (NIZORAL) 2 % cream Apply 1 application topically 2 (two) times daily.    Marland Kitchen leflunomide (ARAVA) 20 MG tablet Take 20 mg by mouth daily.    Marland Kitchen  meclizine (ANTIVERT) 25 MG tablet TAKE 1 TABLET(25 MG) BY MOUTH THREE TIMES DAILY AS NEEDED FOR DIZZINESS 90 tablet 5  . metolazone (ZAROXOLYN) 2.5 MG tablet Take 2.5 mg by mouth daily as needed.    . metoprolol succinate (TOPROL-XL) 50 MG 24 hr tablet TAKE 1 TABLET BY MOUTH EVERY DAY WITH OR IMMEDIATELY FOLLOWING A MEAL 90 tablet 3  . montelukast (SINGULAIR) 10 MG tablet TAKE 1 TABLET BY MOUTH AT BEDTIME 90 tablet 3  . mupirocin ointment (BACTROBAN) 2 % Apply 1 application topically 2 (two) times daily. Qd to burn  wounds 30 g 3  . nitroGLYCERIN (NITROSTAT) 0.4 MG SL tablet Place 1 tablet (0.4 mg total) under the tongue every 5 (five) minutes as needed for chest pain. 20 tablet 12  . nystatin (MYCOSTATIN/NYSTOP) powder Apply 1 application topically 3 (three) times daily as needed.    . Oxycodone HCl 20 MG TABS Take 1 tablet (20 mg total) by mouth in the morning and at bedtime. Substitute for prev rx 5 tablet 0  . polyethylene glycol (MIRALAX / GLYCOLAX) packet Take 17 g by mouth daily as needed for mild constipation. 14 each 0  . Potassium Chloride ER 20 MEQ TBCR Take 20 mEq by mouth in the morning and at bedtime. 180 tablet 3  . SF 5000 PLUS 1.1 % CREA dental cream Place 1 application onto teeth 2 (two) times daily.  1  . spironolactone (ALDACTONE) 25 MG tablet TAKE 1 TABLET(25 MG) BY MOUTH DAILY 30 tablet 11  . terazosin (HYTRIN) 10 MG capsule TAKE 1 CAPSULE(10 MG) BY MOUTH AT BEDTIME 90 capsule 3  . torsemide (DEMADEX) 20 MG tablet Take 40 mg by mouth daily. Takes an extra tablet when feeling SOB or has noticeable edema     No current facility-administered medications on file prior to visit.    There are no Patient Instructions on file for this visit. No follow-ups on file.   Kris Hartmann, NP

## 2020-07-15 DIAGNOSIS — J9601 Acute respiratory failure with hypoxia: Secondary | ICD-10-CM | POA: Diagnosis not present

## 2020-07-15 DIAGNOSIS — M0579 Rheumatoid arthritis with rheumatoid factor of multiple sites without organ or systems involvement: Secondary | ICD-10-CM | POA: Diagnosis not present

## 2020-07-15 DIAGNOSIS — I4891 Unspecified atrial fibrillation: Secondary | ICD-10-CM | POA: Diagnosis not present

## 2020-07-15 DIAGNOSIS — I5033 Acute on chronic diastolic (congestive) heart failure: Secondary | ICD-10-CM | POA: Diagnosis not present

## 2020-07-15 DIAGNOSIS — I890 Lymphedema, not elsewhere classified: Secondary | ICD-10-CM | POA: Diagnosis not present

## 2020-07-15 DIAGNOSIS — I110 Hypertensive heart disease with heart failure: Secondary | ICD-10-CM | POA: Diagnosis not present

## 2020-07-15 DIAGNOSIS — I2510 Atherosclerotic heart disease of native coronary artery without angina pectoris: Secondary | ICD-10-CM | POA: Diagnosis not present

## 2020-07-16 ENCOUNTER — Telehealth: Payer: Self-pay

## 2020-07-16 NOTE — Telephone Encounter (Signed)
Marlowe Kays OT with [facility] left v/m requesting verbal orders for [facility] OT 1 x a wk for 6 wks.

## 2020-07-16 NOTE — Telephone Encounter (Signed)
That is okay.

## 2020-07-16 NOTE — Telephone Encounter (Signed)
Verbal orders given to Connie. 

## 2020-07-19 ENCOUNTER — Encounter (INDEPENDENT_AMBULATORY_CARE_PROVIDER_SITE_OTHER): Payer: Self-pay | Admitting: Nurse Practitioner

## 2020-07-19 NOTE — Progress Notes (Signed)
Subjective:    Patient ID: Tina Pacheco, female    DOB: 1950-04-17, 70 y.o.   MRN: [id number] Chief Complaint  Patient presents with  . Follow-up    Ultrasound follow up    Tina Pacheco is a 70 year old woman that presents today for follow-up evaluation in regards to worsening lower extremity edema.  The patient's daughter does help assist with much of the history.  The patient has had difficulty with maintaining her weight due to numerous medical factors such as chronic kidney disease and heart failure.  The patient also has atrial fibrillation which is also contributing.  The patient's baseline weight is around 300 pounds.  However her weight has vacillated up to around 315.  The patient will use diuretics and her weight will slowly return back to 300.  The patient has lost approximately 10 to 15 pounds since her last office visit with [place].  This is fluid weight.  The patient continues to try to elevate her lower extremities is much as possible.  She is also working on eating within her calorie deficit as well as trying to be more active.  There are no wounds or ulcerations.  The lower extremities do not have any evidence of bleeding.  Today noninvasive studies show no evidence of DVT or superficial thrombophlebitis bilaterally.  Right lower extremity has no evidence of deep venous insufficiency or superficial venous reflux.  Left has superficial venous reflux in the great saphenous vein at the saphenofemoral junction.  The patient's left knee also has a Baker's cyst measuring 4.68 cm x 0.9 cm x 1.92 cm.   Review of Systems  Cardiovascular: Positive for leg swelling.  Neurological: Positive for weakness.  All other systems reviewed and are negative.      Objective:   Physical Exam Vitals reviewed.  Constitutional:      Appearance: She is obese.  HENT:     Head: Normocephalic.  Cardiovascular:     Rate and Rhythm: Normal rate.  Pulmonary:     Effort: Pulmonary effort is normal.   Musculoskeletal:     Right lower leg: 2+ Edema present.     Left lower leg: 2+ Edema present.  Neurological:     Mental Status: She is alert and oriented to person, place, and time.  Psychiatric:        Mood and Affect: Mood normal.        Thought Content: Thought content normal.        Judgment: Judgment normal.     BP (!) 188/77 (BP Location: Right Arm)   Pulse 60   Resp 18   Wt (!) 323 lb 6.4 oz (146.7 kg)   BMI 57.29 kg/m   Past Medical History:  Diagnosis Date  . Arthritis    RA  . Asthma   . Basal cell carcinoma   . Cataract 2018   bilateral eyes; corrected with surgery  . Claustrophobia   . Collagen vascular disease (HCC)    RA  . COPD (chronic obstructive pulmonary disease) ([place])   . Diastolic dysfunction    a. echo 07/2014: EF 55-60%, no RWMA, GR2DD, mild MR, LA moderately dilated, PASP 38 mm Hg  . Dyslipidemia   . Headache    migraines  . Hemihypertrophy   . History of cardiac cath    a. 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 History of gout   . Hyperplastic colonic polyp 2003  . Hypertension   . Hypokalemia   . Morbid  obesity ([place])   . PAF (paroxysmal atrial fibrillation) (HCC)    a. on Pradaxa; b. CHADSVASc at least 2 (HTN & female)  . Rheumatoid arthritis(714.0)   . Sleep apnea    a. not compliant with CPAP    Social History   Socioeconomic History  . Marital status: Married    Spouse name: Not on file  . Number of children: 1  . Years of education: Not on file  . Highest education level: Not on file  Occupational History  . 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 Smoking status: Never Smoker  . Smokeless tobacco: Never Used  Vaping Use  . Vaping Use: Never used  Substance and Sexual Activity  . Alcohol use: No  . Drug use: No  . Sexual activity: Not Currently  Other Topics Concern  . Not on file  Social History Narrative   No living will   Husband, then daughter should be  decision maker   Would accept resuscitation attempts   Not sure about tube feeds   Social Determinants of Health   Financial Resource Strain: Not on file  Food Insecurity: No Food Insecurity  . Worried About Charity fundraiser in the Last Year: Never true  . Ran Out of Food in the Last Year: Never true  Transportation Needs: No Transportation Needs  . Lack of Transportation (Medical): No  . Lack of Transportation (Non-Medical): No  Physical Activity: Not on file  Stress: Not on file  Social Connections: Not on file  Intimate Partner Violence: Not on file    Past Surgical History:  Procedure Laterality Date  . ABDOMINAL HYSTERECTOMY    . APPLICATION OF WOUND VAC Right 08/09/2017   Procedure: APPLICATION OF WOUND VAC;  Surgeon: Albertine Patricia, DPM;  Location: [facility];  Service: Podiatry;  Laterality: Right;  . CARDIAC CATHETERIZATION  05/24/2010   nonobstructive CAD  . CATARACT EXTRACTION W/ INTRAOCULAR LENS IMPLANT Right 06/12/2016   Dr. Darleen Crocker  . CATARACT EXTRACTION W/ INTRAOCULAR LENS IMPLANT Left 06/26/2016   Dr. Darleen Crocker  . CESAREAN SECTION    . CHOLECYSTECTOMY    . COLONOSCOPY  06/12/2011   Procedure: COLONOSCOPY;  Surgeon: Juanita Craver, MD;  Location: [facility];  Service: Endoscopy;  Laterality: N/A;  . COLONOSCOPY N/A 03/17/2013   Procedure: COLONOSCOPY;  Surgeon: Juanita Craver, MD;  Location: [facility];  Service: Endoscopy;  Laterality: N/A;  . EYE SURGERY    . FOOT ARTHRODESIS Right 07/13/2017   Procedure: ARTHRODESIS FOOT-MULTI.FUSIONS (6 JOINTS);  Surgeon: Albertine Patricia, DPM;  Location: [facility];  Service: Podiatry;  Laterality: Right;  . IRRIGATION AND DEBRIDEMENT FOOT Right 08/09/2017   Procedure: IRRIGATION AND DEBRIDEMENT FOOT;  Surgeon: Albertine Patricia, DPM;  Location: [facility];  Service: Podiatry;  Laterality: Right;  . KNEE ARTHROSCOPY     bilateral  . LEFT HEART CATH AND CORONARY ANGIOGRAPHY N/A 06/04/2020   Procedure: LEFT HEART CATH AND  CORONARY ANGIOGRAPHY;  Surgeon: Wellington Hampshire, MD;  Location: [facility];  Service: Cardiovascular;  Laterality: N/A;  . SINUSOTOMY    . TOOTH EXTRACTION  12/2016  . VAGINAL HYSTERECTOMY      Family History  Problem Relation Age of Onset  . Emphysema Father        smoked  . Arthritis Father   . Tuberculosis Mother   . Parkinsonism Mother   . Cancer Sister   . Diabetes type  II Sister   . Breast cancer Sister   . Breast cancer Maternal Aunt   . Tuberculosis Sister     Allergies  Allergen Reactions  . Fluoxetine     Headache, shaking, sleep issues  . Codeine     Nausea and vomiting/only when taking too much    CBC Latest Ref Rng & Units 06/07/2020 06/04/2020 06/03/2020  WBC 4.0 - 10.5 K/uL 10.0 9.0 7.1  Hemoglobin 12.0 - 15.0 g/dL 12.3 12.8 12.7  Hematocrit 36.0 - 46.0 % 37.7 39.0 39.6  Platelets 150 - 400 K/uL 187 211 200      CMP     Component Value Date/Time   NA 139 06/29/2020 1028   NA 141 03/29/2014 0522   K 3.0 (L) 06/29/2020 1028   K 3.6 03/29/2014 0522   CL 92 (L) 06/29/2020 1028   CL 111 (H) 03/29/2014 0522   CO2 26 06/29/2020 1028   CO2 24 03/29/2014 0522   GLUCOSE 95 06/29/2020 1028   GLUCOSE 112 (H) 06/23/2020 1236   GLUCOSE 104 (H) 03/29/2014 0522   BUN 37 (H) 06/29/2020 1028   BUN 11 03/29/2014 0522   CREATININE 1.62 (H) 06/29/2020 1028   CREATININE 0.90 03/29/2014 0522   CREATININE 0.84 10/26/2010 1037   CALCIUM 10.2 06/29/2020 1028   CALCIUM 9.2 03/29/2014 0522   PROT 6.3 06/29/2020 1028   PROT 6.8 03/28/2014 1032   ALBUMIN 4.0 06/29/2020 1028   ALBUMIN 3.1 (L) 03/28/2014 1032   AST 27 06/29/2020 1028   AST 35 03/28/2014 1032   ALT 17 06/29/2020 1028   ALT 18 03/28/2014 1032   ALKPHOS 107 06/29/2020 1028   ALKPHOS 92 03/28/2014 1032   BILITOT 1.1 06/29/2020 1028   BILITOT 0.9 03/28/2014 1032   GFRNONAA 33 (L) 06/23/2020 1236   GFRNONAA >60 03/29/2014 0522   GFRAA >60 04/04/2019 0539   GFRAA >60 03/29/2014 0522     No  results found.     Assessment & Plan:   1. Lymphedema After discussion with the patient and her daughter we would not move forward with lymphedema pump at this time but we will try to actively engage in conservative therapy.  We have discussed normally compression socks but compression wraps (he also notes Farrow wraps), that may be more beneficial to the patient.  The patient is also going to continue to try to watch her diet including things like salt intake and fluid intake.  The patient is also working on eating a calorie deficit for weight loss as well.  The patient is also going to attempt to be more active as well.  We will have the patient to 100 these conservative tactics prior to moving forward with a lymphedema pump.  The patient will follow-up in 6 months for evaluation of progression.  However it is noted that if the patient begins to have weeping or ulcerations she should follow-up sooner for further evaluation.  2. Stage 3b chronic kidney disease ([place]) This is also contributing to the patient's swelling as well.  3. Diastolic heart failure, unspecified HF chronicity ([place]) This is a contributing cause to the patient's vacillating weight and fluid status   Current Outpatient Medications on File Prior to Visit  Medication Sig Dispense Refill  . amiodarone (PACERONE) 200 MG tablet Take 1 tablet (200 mg total) by mouth daily. Take one tab daily. 30 tablet 11  . apixaban (ELIQUIS) 5 MG TABS tablet Take 1 tablet (5 mg total) by mouth 2 (two) times  daily. 180 tablet 1  . atorvastatin (LIPITOR) 20 MG tablet TAKE 1 TABLET BY MOUTH DAILY AT 6 PM 90 tablet 3  . Certolizumab Pegol (CIMZIA [place]) Inject into the skin every 30 (thirty) days.    Stasia Cavalier (EUCRISA) 2 % OINT Apply to aa's BID PRN flares. 60 g 3  . gabapentin (NEURONTIN) 300 MG capsule Take 1 capsule (300 mg total) by mouth 4 (four) times daily. 120 capsule 11  . ketoconazole (NIZORAL) 2 % cream Apply 1 application topically 2  (two) times daily.    Marland Kitchen leflunomide (ARAVA) 20 MG tablet Take 20 mg by mouth daily.    . meclizine (ANTIVERT) 25 MG tablet TAKE 1 TABLET(25 MG) BY MOUTH THREE TIMES DAILY AS NEEDED FOR DIZZINESS 90 tablet 5  . metolazone (ZAROXOLYN) 2.5 MG tablet Take 2.5 mg by mouth daily as needed.    . metoprolol succinate (TOPROL-XL) 50 MG 24 hr tablet TAKE 1 TABLET BY MOUTH EVERY DAY WITH OR IMMEDIATELY FOLLOWING A MEAL 90 tablet 3  . montelukast (SINGULAIR) 10 MG tablet TAKE 1 TABLET BY MOUTH AT BEDTIME 90 tablet 3  . mupirocin ointment (BACTROBAN) 2 % Apply 1 application topically 2 (two) times daily. Qd to burn wounds 30 g 3  . nitroGLYCERIN (NITROSTAT) 0.4 MG SL tablet Place 1 tablet (0.4 mg total) under the tongue every 5 (five) minutes as needed for chest pain. 20 tablet 12  . nystatin (MYCOSTATIN/NYSTOP) powder Apply 1 application topically 3 (three) times daily as needed.    . Oxycodone HCl 20 MG TABS Take 1 tablet (20 mg total) by mouth in the morning and at bedtime. Substitute for prev rx 5 tablet 0  . polyethylene glycol (MIRALAX / GLYCOLAX) packet Take 17 g by mouth daily as needed for mild constipation. 14 each 0  . Potassium Chloride ER 20 MEQ TBCR Take 20 mEq by mouth in the morning and at bedtime. 180 tablet 3  . SF 5000 PLUS 1.1 % CREA dental cream Place 1 application onto teeth 2 (two) times daily.  1  . spironolactone (ALDACTONE) 25 MG tablet TAKE 1 TABLET(25 MG) BY MOUTH DAILY 30 tablet 11  . terazosin (HYTRIN) 10 MG capsule TAKE 1 CAPSULE(10 MG) BY MOUTH AT BEDTIME 90 capsule 3  . torsemide (DEMADEX) 20 MG tablet Take 40 mg by mouth daily. Takes an extra tablet when feeling SOB or has noticeable edema     No current facility-administered medications on file prior to visit.    There are no Patient Instructions on file for this visit. No follow-ups on file.   Kris Hartmann, NP

## 2020-07-20 ENCOUNTER — Other Ambulatory Visit: Payer: Self-pay | Admitting: Internal Medicine

## 2020-07-20 DIAGNOSIS — I5033 Acute on chronic diastolic (congestive) heart failure: Secondary | ICD-10-CM | POA: Diagnosis not present

## 2020-07-20 DIAGNOSIS — I2510 Atherosclerotic heart disease of native coronary artery without angina pectoris: Secondary | ICD-10-CM | POA: Diagnosis not present

## 2020-07-20 DIAGNOSIS — I890 Lymphedema, not elsewhere classified: Secondary | ICD-10-CM | POA: Diagnosis not present

## 2020-07-20 DIAGNOSIS — I110 Hypertensive heart disease with heart failure: Secondary | ICD-10-CM | POA: Diagnosis not present

## 2020-07-20 DIAGNOSIS — I4891 Unspecified atrial fibrillation: Secondary | ICD-10-CM | POA: Diagnosis not present

## 2020-07-20 DIAGNOSIS — J9601 Acute respiratory failure with hypoxia: Secondary | ICD-10-CM | POA: Diagnosis not present

## 2020-07-22 DIAGNOSIS — I4891 Unspecified atrial fibrillation: Secondary | ICD-10-CM | POA: Diagnosis not present

## 2020-07-22 DIAGNOSIS — I2510 Atherosclerotic heart disease of native coronary artery without angina pectoris: Secondary | ICD-10-CM | POA: Diagnosis not present

## 2020-07-22 DIAGNOSIS — I890 Lymphedema, not elsewhere classified: Secondary | ICD-10-CM | POA: Diagnosis not present

## 2020-07-22 DIAGNOSIS — I110 Hypertensive heart disease with heart failure: Secondary | ICD-10-CM | POA: Diagnosis not present

## 2020-07-22 DIAGNOSIS — J9601 Acute respiratory failure with hypoxia: Secondary | ICD-10-CM | POA: Diagnosis not present

## 2020-07-22 DIAGNOSIS — I5033 Acute on chronic diastolic (congestive) heart failure: Secondary | ICD-10-CM | POA: Diagnosis not present

## 2020-07-23 ENCOUNTER — Telehealth: Payer: Self-pay | Admitting: Internal Medicine

## 2020-07-23 NOTE — Telephone Encounter (Signed)
Tina Pacheco called in and stated she wasn't feeling well yesterday or today and will do OT next week

## 2020-07-23 NOTE — Telephone Encounter (Signed)
That is fine 

## 2020-07-27 DIAGNOSIS — J9601 Acute respiratory failure with hypoxia: Secondary | ICD-10-CM | POA: Diagnosis not present

## 2020-07-27 DIAGNOSIS — I890 Lymphedema, not elsewhere classified: Secondary | ICD-10-CM | POA: Diagnosis not present

## 2020-07-27 DIAGNOSIS — I110 Hypertensive heart disease with heart failure: Secondary | ICD-10-CM | POA: Diagnosis not present

## 2020-07-27 DIAGNOSIS — I2510 Atherosclerotic heart disease of native coronary artery without angina pectoris: Secondary | ICD-10-CM | POA: Diagnosis not present

## 2020-07-27 DIAGNOSIS — I5033 Acute on chronic diastolic (congestive) heart failure: Secondary | ICD-10-CM | POA: Diagnosis not present

## 2020-07-27 DIAGNOSIS — I4891 Unspecified atrial fibrillation: Secondary | ICD-10-CM | POA: Diagnosis not present

## 2020-07-29 ENCOUNTER — Other Ambulatory Visit: Payer: Self-pay | Admitting: Internal Medicine

## 2020-07-29 DIAGNOSIS — I110 Hypertensive heart disease with heart failure: Secondary | ICD-10-CM | POA: Diagnosis not present

## 2020-07-29 DIAGNOSIS — I890 Lymphedema, not elsewhere classified: Secondary | ICD-10-CM | POA: Diagnosis not present

## 2020-07-29 DIAGNOSIS — J9601 Acute respiratory failure with hypoxia: Secondary | ICD-10-CM | POA: Diagnosis not present

## 2020-07-29 DIAGNOSIS — I5033 Acute on chronic diastolic (congestive) heart failure: Secondary | ICD-10-CM | POA: Diagnosis not present

## 2020-07-29 DIAGNOSIS — I4891 Unspecified atrial fibrillation: Secondary | ICD-10-CM | POA: Diagnosis not present

## 2020-07-29 DIAGNOSIS — I2510 Atherosclerotic heart disease of native coronary artery without angina pectoris: Secondary | ICD-10-CM | POA: Diagnosis not present

## 2020-07-29 MED ORDER — OXYCODONE HCL 20 MG PO TABS: 1.0000 | ORAL_TABLET | Freq: Two times a day (BID) | ORAL | 0 refills | Status: DC

## 2020-07-29 NOTE — Progress Notes (Signed)
Date:  07/30/2020   ID:  Vieva, Brummitt [birth year], MRN [id number]  Patient Location:  [place] [address]   Provider location:   [facility], [place] office  PCP:  Venia Carbon, MD  Cardiologist:  Ida Rogue, MD   Chief Complaint  Patient presents with  . 1 month follow up     History of Present Illness:    Tina Pacheco is a 70 y.o. female  past medical history of: morbid obesity , fibromyalgia  March 26,2012paroxysmal atrial fibrillation,  echocardiogram showing normal LV function with diastolic dysfunction,  hyperlipidemia, hypertension,  cardiac catheterization May 24 2010 mild nonobstructive coronary artery disease,  asthma/COPD,  suspected sleep apnea,  atrial fibrillation March and July 2012, October 2013.(no symptoms) started on diltiazem infusion and converted to normal sinus rhythm  rheumatoid arthritis  who presents for routine followup of her atrial fibrillation, chronic diastolic CHF.  Last seen in clinic Jun 29, 2020 Long discussion on that visit concerning need to change her diet Family was bringing in the food for her including potato chips etc. Weight was trending upwards  Leg swelling felt to be predominate lymphedema Was not responding to torsemide 40 twice daily and metolazone with worsening renal function Sedentary, sits most of the day  unable to tolerate Unna wraps, compression hose Has been referred to vein and vascular for consideration of lymphedema compression pumps  Weight down 40 pounds past 2 months Diet better, TAKING HER PILLS ...somewhat, less takout Complains of headache,   Torsemide script for 40 BID Often will miss afterdose dose Still taking metolazone (was told not to)  At [facility] 4/12 to 06/29/20   Baseline weight likely low 300s  EKG personally reviewed by myself on todays visit NSR rate 66 bpm no ST or T wave changes Denies any symptoms from  bradycardia   Prior records reviewed In hospital November into 01/2020, discharged on December 2 Tremendous diuresis  Previous Echo in 2016 with EF 51%, diastolic dysfunction 08/6158: EF 55%   Prior CV studies:   The following studies were reviewed today:  Echo 2016 Left ventricle: The cavity size was normal. There was moderate   concentric hypertrophy. Systolic function was normal. The   estimated ejection fraction was in the range of 55% to 60%. Wall   motion was normal; there were no regional wall motion   abnormalities. Features are consistent with a pseudonormal left   ventricular filling pattern, with concomitant abnormal relaxation   and increased filling pressure (grade 2 diastolic dysfunction). - Mitral valve: Calcified annulus. There was mild regurgitation. - Left atrium: The atrium was moderately dilated. - Pulmonary arteries: Systolic pressure was mildly increased. PA   peak pressure: 38 mm Hg (S).  Past Medical History:  Diagnosis Date  . Arthritis    RA  . Asthma   . Basal cell carcinoma   . Cataract 2018   bilateral eyes; corrected with surgery  . Claustrophobia   . Collagen vascular disease (HCC)    RA  . COPD (chronic obstructive pulmonary disease) ([place])   . Diastolic dysfunction    a. echo 07/2014: EF 55-60%, no RWMA, GR2DD, mild MR, LA moderately dilated, PASP 38 mm Hg  . Dyslipidemia   . Headache    migraines  . Hemihypertrophy   . History of cardiac cath    a. 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 History of gout   . Hyperplastic colonic  polyp 2003  . Hypertension   . Hypokalemia   . Morbid obesity ([place])   . PAF (paroxysmal atrial fibrillation) (HCC)    a. on Pradaxa; b. CHADSVASc at least 2 (HTN & female)  . Rheumatoid arthritis(714.0)   . Sleep apnea    a. not compliant with CPAP   Past Surgical History:  Procedure Laterality Date  . ABDOMINAL HYSTERECTOMY    . APPLICATION OF WOUND VAC Right 08/09/2017   Procedure: APPLICATION  OF WOUND VAC;  Surgeon: Albertine Patricia, DPM;  Location: [facility];  Service: Podiatry;  Laterality: Right;  . CARDIAC CATHETERIZATION  05/24/2010   nonobstructive CAD  . CATARACT EXTRACTION W/ INTRAOCULAR LENS IMPLANT Right 06/12/2016   Dr. Darleen Crocker  . CATARACT EXTRACTION W/ INTRAOCULAR LENS IMPLANT Left 06/26/2016   Dr. Darleen Crocker  . CESAREAN SECTION    . CHOLECYSTECTOMY    . COLONOSCOPY  06/12/2011   Procedure: COLONOSCOPY;  Surgeon: Juanita Craver, MD;  Location: [facility];  Service: Endoscopy;  Laterality: N/A;  . COLONOSCOPY N/A 03/17/2013   Procedure: COLONOSCOPY;  Surgeon: Juanita Craver, MD;  Location: [facility];  Service: Endoscopy;  Laterality: N/A;  . EYE SURGERY    . FOOT ARTHRODESIS Right 07/13/2017   Procedure: ARTHRODESIS FOOT-MULTI.FUSIONS (6 JOINTS);  Surgeon: Albertine Patricia, DPM;  Location: [facility];  Service: Podiatry;  Laterality: Right;  . IRRIGATION AND DEBRIDEMENT FOOT Right 08/09/2017   Procedure: IRRIGATION AND DEBRIDEMENT FOOT;  Surgeon: Albertine Patricia, DPM;  Location: [facility];  Service: Podiatry;  Laterality: Right;  . KNEE ARTHROSCOPY     bilateral  . LEFT HEART CATH AND CORONARY ANGIOGRAPHY N/A 06/04/2020   Procedure: LEFT HEART CATH AND CORONARY ANGIOGRAPHY;  Surgeon: Wellington Hampshire, MD;  Location: [facility];  Service: Cardiovascular;  Laterality: N/A;  . SINUSOTOMY    . TOOTH EXTRACTION  12/2016  . VAGINAL HYSTERECTOMY       Current Outpatient Medications on File Prior to Visit  Medication Sig Dispense Refill  . amiodarone (PACERONE) 200 MG tablet Take 1 tablet (200 mg total) by mouth daily. Take one tab daily. 30 tablet 11  . apixaban (ELIQUIS) 5 MG TABS tablet Take 1 tablet (5 mg total) by mouth 2 (two) times daily. 180 tablet 1  . atorvastatin (LIPITOR) 20 MG tablet TAKE 1 TABLET BY MOUTH DAILY AT 6 PM 90 tablet 3  . Certolizumab Pegol (CIMZIA [place]) Inject into the skin every 30 (thirty) days.    Stasia Cavalier (EUCRISA) 2 % OINT  Apply to aa's BID PRN flares. 60 g 3  . gabapentin (NEURONTIN) 300 MG capsule Take 1 capsule (300 mg total) by mouth 4 (four) times daily. 120 capsule 11  . ketoconazole (NIZORAL) 2 % cream Apply 1 application topically 2 (two) times daily.    Marland Kitchen leflunomide (ARAVA) 20 MG tablet Take 20 mg by mouth daily.    . meclizine (ANTIVERT) 25 MG tablet TAKE 1 TABLET(25 MG) BY MOUTH THREE TIMES DAILY AS NEEDED FOR DIZZINESS 90 tablet 5  . metolazone (ZAROXOLYN) 2.5 MG tablet Take 2.5 mg by mouth daily as needed.    . metoprolol succinate (TOPROL-XL) 50 MG 24 hr tablet TAKE 1 TABLET BY MOUTH EVERY DAY WITH OR IMMEDIATELY FOLLOWING A MEAL 90 tablet 3  . montelukast (SINGULAIR) 10 MG tablet TAKE 1 TABLET BY MOUTH AT BEDTIME 90 tablet 3  . mupirocin ointment (BACTROBAN) 2 % Apply 1 application topically 2 (two) times daily. Qd to burn wounds 30 g 3  .  nitroGLYCERIN (NITROSTAT) 0.4 MG SL tablet Place 1 tablet (0.4 mg total) under the tongue every 5 (five) minutes as needed for chest pain. 20 tablet 12  . nystatin (MYCOSTATIN/NYSTOP) powder Apply 1 application topically 3 (three) times daily as needed.    . Oxycodone HCl 20 MG TABS Take 1 tablet (20 mg total) by mouth in the morning and at bedtime. 120 tablet 0  . polyethylene glycol (MIRALAX / GLYCOLAX) packet Take 17 g by mouth daily as needed for mild constipation. 14 each 0  . Potassium Chloride ER 20 MEQ TBCR Take 20 mEq by mouth in the morning and at bedtime. 180 tablet 3  . SF 5000 PLUS 1.1 % CREA dental cream Place 1 application onto teeth 2 (two) times daily.  1  . spironolactone (ALDACTONE) 25 MG tablet TAKE 1 TABLET(25 MG) BY MOUTH DAILY 30 tablet 11  . terazosin (HYTRIN) 10 MG capsule TAKE 1 CAPSULE(10 MG) BY MOUTH AT BEDTIME 90 capsule 3  . torsemide (DEMADEX) 20 MG tablet Take 40 mg by mouth daily. Takes an extra tablet when feeling SOB or has noticeable edema     No current facility-administered medications on file prior to visit.    Allergies:    Fluoxetine and Codeine   Social History   Tobacco Use  . Smoking status: Never Smoker  . Smokeless tobacco: Never Used  Vaping Use  . Vaping Use: Never used  Substance Use Topics  . Alcohol use: No  . Drug use: No     Family Hx: The patient's family history includes Arthritis in her father; Breast cancer in her maternal aunt and sister; Cancer in her sister; Diabetes type II in her sister; Emphysema in her father; Parkinsonism in her mother; Tuberculosis in her mother and sister.  ROS:   Please see the history of present illness.    Review of Systems  Constitutional: Negative.   HENT: Negative.   Respiratory: Negative.   Cardiovascular: Positive for leg swelling.  Gastrointestinal: Negative.   Musculoskeletal: Positive for joint pain.  Neurological: Negative.   Psychiatric/Behavioral: Negative.   All other systems reviewed and are negative.    Labs/Other Tests and Data Reviewed:    Recent Labs: 06/02/2020: Magnesium 1.9 06/07/2020: Hemoglobin 12.3; Platelets 187 06/23/2020: B Natriuretic Peptide 89.0 06/29/2020: ALT 17; BUN 37; Creatinine, Ser 1.62; Potassium 3.0; Sodium 139   Recent Lipid Panel Lab Results  Component Value Date/Time   CHOL 113 08/27/2019 12:47 PM   TRIG 70.0 08/27/2019 12:47 PM   HDL 43.70 08/27/2019 12:47 PM   CHOLHDL 3 08/27/2019 12:47 PM   LDLCALC 56 08/27/2019 12:47 PM    Wt Readings from Last 3 Encounters:  07/13/20 (!) 323 lb 6.4 oz (146.7 kg)  07/07/20 (!) 336 lb (152.4 kg)  07/05/20 (!) 340 lb (154.2 kg)     Exam:    Vital Signs:  There were no vitals taken for this visit.  Constitutional:  oriented to person, place, and time. No distress.  HENT:  Head: Grossly normal Eyes:  no discharge. No scleral icterus.  Neck: Unable to estimate JVD, no carotid bruits  Cardiovascular: Regular rate and rhythm, no murmurs appreciated 1+ pitting lower extremity edema Pulmonary/Chest: Clear to auscultation bilaterally, no wheezes or  rails Abdominal: Soft.  no distension.  no tenderness.  Musculoskeletal: Normal range of motion Neurological:  normal muscle tone. Coordination normal. No atrophy Skin: Skin warm and dry Psychiatric: normal affect, pleasant   ASSESSMENT & PLAN:     1.  PAF (paroxysmal atrial fibrillation) (HCC) Maintaining normal sinus rhythm  No changes to meds  2. Essential hypertension Blood pressure is well controlled on today's visit. No changes made to the medications.  3. Mixed hyperlipidemia Cholesterol is at goal on the current lipid regimen. No changes to the medications were made.  4. Rheumatoid arthritis, involving unspecified site, unspecified rheumatoid factor presence ([place]) Started on new medication, Concerned about potential for side effects but not having any at this time  5.  Morbid obesity Stressed strict diet weigth down 40 pounds in 6 weeks to 2 months  6.  Depression Managed by primary care  7. Lymphedema Swelling better  8.  Chronic diastolic CHF Torsemide 40 twice daily,  hold all metolazone unless weight >310   Total encounter time more than 25 minutes  Greater than 50% was spent in counseling and coordination of care with the patient   Signed, Ida Rogue, MD  07/30/2020 8:30 AM    [facility] [id number], [place], [place] [address]

## 2020-07-29 NOTE — Telephone Encounter (Signed)
Name of Medication:oxycodone 10 mg Name of Pharmacy:[facility] [address] or Written Date and Quantity:06-01-20#120 Last Office Visit and Type:07-07-20 Next Office Visit and Type:10-12-20 Last Controlled Substance Agreement Date:05/23/19 Last UDS:05/14/19

## 2020-07-29 NOTE — Telephone Encounter (Signed)
Pt called in wanted to know about why she only received 2 days worth of prescription of the oxycodone.

## 2020-07-29 NOTE — Telephone Encounter (Signed)
  LAST APPOINTMENT DATE: 07/23/2020   NEXT APPOINTMENT DATE:@6 /10/2020  MEDICATION: oxycodone hcl 20 mg tabs   PHARMACY: [facility] [address] [place]  Patient only has 3 days worth.   Let patient know to contact pharmacy at the end of the day to make sure medication is ready.  Please notify patient to allow 48-72 hours to process  Encourage patient to contact the pharmacy for refills or they can request refills through [place]:   LAST REFILL:  QTY:  REFILL DATE:    OTHER COMMENTS:    Okay for refill?  Please advise

## 2020-07-29 NOTE — Addendum Note (Signed)
Addended by: Pilar Grammes on: 07/29/2020 03:39 PM   Modules accepted: Orders

## 2020-07-29 NOTE — Telephone Encounter (Signed)
I forgot to change the quantity on the rx when it was built and sent to Dr Silvio Pate.

## 2020-07-29 NOTE — Addendum Note (Signed)
Addended by: Viviana Simpler I on: 07/29/2020 05:25 PM   Modules accepted: Orders

## 2020-07-29 NOTE — Addendum Note (Signed)
Addended by: Pilar Grammes on: 07/29/2020 09:34 AM   Modules accepted: Orders

## 2020-07-29 NOTE — Addendum Note (Signed)
Addended by: Viviana Simpler I on: 07/29/2020 01:35 PM   Modules accepted: Orders

## 2020-07-30 ENCOUNTER — Ambulatory Visit (INDEPENDENT_AMBULATORY_CARE_PROVIDER_SITE_OTHER): Payer: Medicare Other | Admitting: Cardiovascular Disease

## 2020-07-30 ENCOUNTER — Telehealth: Payer: Self-pay

## 2020-07-30 ENCOUNTER — Other Ambulatory Visit: Payer: Self-pay

## 2020-07-30 VITALS — BP 120/70 | HR 66 | Ht 63.0 in | Wt 301.2 lb

## 2020-07-30 DIAGNOSIS — E782 Mixed hyperlipidemia: Secondary | ICD-10-CM | POA: Diagnosis not present

## 2020-07-30 DIAGNOSIS — I480 Paroxysmal atrial fibrillation: Secondary | ICD-10-CM

## 2020-07-30 DIAGNOSIS — I10 Essential (primary) hypertension: Secondary | ICD-10-CM

## 2020-07-30 DIAGNOSIS — I110 Hypertensive heart disease with heart failure: Secondary | ICD-10-CM | POA: Diagnosis not present

## 2020-07-30 DIAGNOSIS — I2510 Atherosclerotic heart disease of native coronary artery without angina pectoris: Secondary | ICD-10-CM

## 2020-07-30 DIAGNOSIS — I5032 Chronic diastolic (congestive) heart failure: Secondary | ICD-10-CM | POA: Diagnosis not present

## 2020-07-30 DIAGNOSIS — I890 Lymphedema, not elsewhere classified: Secondary | ICD-10-CM

## 2020-07-30 DIAGNOSIS — J449 Chronic obstructive pulmonary disease, unspecified: Secondary | ICD-10-CM | POA: Diagnosis not present

## 2020-07-30 DIAGNOSIS — J9601 Acute respiratory failure with hypoxia: Secondary | ICD-10-CM | POA: Diagnosis not present

## 2020-07-30 DIAGNOSIS — I5033 Acute on chronic diastolic (congestive) heart failure: Secondary | ICD-10-CM | POA: Diagnosis not present

## 2020-07-30 DIAGNOSIS — I4891 Unspecified atrial fibrillation: Secondary | ICD-10-CM | POA: Diagnosis not present

## 2020-07-30 NOTE — Telephone Encounter (Signed)
Received call from  Patient looks like when second script was sent in it was for 120 and should be for 60. She has not received call from pharmacy that it is ready thinks that they are holding and not filling due to wrong #

## 2020-07-30 NOTE — Patient Instructions (Signed)
Medication Instructions:  No changes  If you need a refill on your cardiac medications before your next appointment, please call your pharmacy.    Lab work: No new labs needed   If you have labs (blood work) drawn today and your tests are completely normal, you will receive your results only by: Marland Kitchen MyChart Message (if you have MyChart) OR . A paper copy in the mail If you have any lab test that is abnormal or we need to change your treatment, we will call you to review the results.   Testing/Procedures: No new testing needed   Follow-Up: At [facility], you and your health needs are our priority.  As part of our continuing mission to provide you with exceptional heart care, we have created designated Provider Care Teams.  These Care Teams include your primary Cardiologist (physician) and Advanced Practice Providers (APPs -  Physician Assistants and Nurse Practitioners) who all work together to provide you with the care you need, when you need it.  . You will need a follow up appointment in 3 months, APP ok  . Providers on your designated Care Team:   . Murray Hodgkins, NP . Christell Faith, PA-C . Marrianne Mood, PA-C  Any Other Special Instructions Will Be Listed Below (If Applicable).  COVID-19 Vaccine Information can be found at: ShippingScam.co.uk For questions related to vaccine distribution or appointments, please email [email or web address] or call (573)136-5843.

## 2020-07-30 NOTE — Telephone Encounter (Signed)
Approved from 06-30-20 to 01-26-21

## 2020-07-30 NOTE — Telephone Encounter (Signed)
Received fax overnight from pharmacy for PA for oxycodone. Just got time to work on it. Cover MyMeds states it will send questions to answer. I will keep checking the site until I have to leave.

## 2020-07-31 ENCOUNTER — Other Ambulatory Visit: Payer: Self-pay | Admitting: Family Medicine

## 2020-07-31 DIAGNOSIS — Z85828 Personal history of other malignant neoplasm of skin: Secondary | ICD-10-CM | POA: Diagnosis not present

## 2020-07-31 DIAGNOSIS — M069 Rheumatoid arthritis, unspecified: Secondary | ICD-10-CM | POA: Diagnosis not present

## 2020-07-31 DIAGNOSIS — J449 Chronic obstructive pulmonary disease, unspecified: Secondary | ICD-10-CM | POA: Diagnosis not present

## 2020-07-31 DIAGNOSIS — M109 Gout, unspecified: Secondary | ICD-10-CM | POA: Diagnosis not present

## 2020-07-31 DIAGNOSIS — I2510 Atherosclerotic heart disease of native coronary artery without angina pectoris: Secondary | ICD-10-CM | POA: Diagnosis not present

## 2020-07-31 DIAGNOSIS — Z79891 Long term (current) use of opiate analgesic: Secondary | ICD-10-CM | POA: Diagnosis not present

## 2020-07-31 DIAGNOSIS — M47819 Spondylosis without myelopathy or radiculopathy, site unspecified: Secondary | ICD-10-CM | POA: Diagnosis not present

## 2020-07-31 DIAGNOSIS — J9601 Acute respiratory failure with hypoxia: Secondary | ICD-10-CM | POA: Diagnosis not present

## 2020-07-31 DIAGNOSIS — I110 Hypertensive heart disease with heart failure: Secondary | ICD-10-CM | POA: Diagnosis not present

## 2020-07-31 DIAGNOSIS — M1990 Unspecified osteoarthritis, unspecified site: Secondary | ICD-10-CM | POA: Diagnosis not present

## 2020-07-31 DIAGNOSIS — E876 Hypokalemia: Secondary | ICD-10-CM | POA: Diagnosis not present

## 2020-07-31 DIAGNOSIS — N179 Acute kidney failure, unspecified: Secondary | ICD-10-CM | POA: Diagnosis not present

## 2020-07-31 DIAGNOSIS — I5033 Acute on chronic diastolic (congestive) heart failure: Secondary | ICD-10-CM | POA: Diagnosis not present

## 2020-07-31 DIAGNOSIS — E785 Hyperlipidemia, unspecified: Secondary | ICD-10-CM | POA: Diagnosis not present

## 2020-07-31 DIAGNOSIS — G4730 Sleep apnea, unspecified: Secondary | ICD-10-CM | POA: Diagnosis not present

## 2020-07-31 DIAGNOSIS — G629 Polyneuropathy, unspecified: Secondary | ICD-10-CM | POA: Diagnosis not present

## 2020-07-31 DIAGNOSIS — I890 Lymphedema, not elsewhere classified: Secondary | ICD-10-CM | POA: Diagnosis not present

## 2020-07-31 DIAGNOSIS — K5900 Constipation, unspecified: Secondary | ICD-10-CM | POA: Diagnosis not present

## 2020-07-31 DIAGNOSIS — I4891 Unspecified atrial fibrillation: Secondary | ICD-10-CM | POA: Diagnosis not present

## 2020-07-31 DIAGNOSIS — Z7901 Long term (current) use of anticoagulants: Secondary | ICD-10-CM | POA: Diagnosis not present

## 2020-07-31 DIAGNOSIS — E669 Obesity, unspecified: Secondary | ICD-10-CM | POA: Diagnosis not present

## 2020-07-31 NOTE — Progress Notes (Signed)
Called about oxycodone rx.  Was sent for #120.  Talked with pharmacy.  She can fill for #60 and that should address the issue.  Routed to PCP as FYI.  I didn't send new rx.

## 2020-08-01 NOTE — Progress Notes (Signed)
Please find out what happened with this and make sure she is taken care of

## 2020-08-02 NOTE — Progress Notes (Signed)
Spoke to pt. We thought about it and realized that a few months ago oxycodonr 20mg  wasn't available so we had to do 10 mg #120. Now she is back to getting the 20mg  she only needs #60.

## 2020-08-02 NOTE — Telephone Encounter (Signed)
This was taken care of in another message

## 2020-08-02 NOTE — Telephone Encounter (Signed)
[place] Night - Client TELEPHONE ADVICE RECORD [facility] Patient Name: Tina Pacheco Gender: Female DOB: 08-11-1950 Age: 70 Y 3 M 27 D Return Phone Number: [phone] (Primary), [phone] (Secondary) [place]  [address] [name] Client Site [name] Physician [name] Contact Type Call Who Is Calling Patient / Member / Family / Caregiver Call Type Triage / Clinical Relationship To Patient Self Return Phone Number [phone] (Primary) Chief Complaint Back Pain - General Reason for Call Symptomatic / Request for Health Information Initial Comment Caller states she called Thursday for her 30 day supply of oxycodone . Caller states the pharmacy had a prescription of 5 pills so she called the office back and the sent in a prescription for 120 pills and her insurance will not cover that many pills. Caller states she only is supposed to get a 30 day supply so her insurance will cover it . Caller is experiencing back pain and she takes this medication on a regular basis and she only has one pill left. Translation No Nurse Assessment Nurse: [name], RN, [name] Date/Time (Eastern Time): 07/31/2020 12:30:50 PM Confirm and document reason for call. If symptomatic, describe symptoms. ---Caller states she called Thursday for her 30 day supply of oxycodone . Caller states the pharmacy had a prescription of 5 pills so she called the office back and the sent in a prescription for 120 pills and her insurance will not cover that many pills. Caller states she only is supposed to get a 30 day supply so her insurance will cover it . Caller is experiencing back pain and she takes this medication on a regular basis and she only has one pill left. Does the patient have any new or worsening symptoms? ---No Disp. Time [name]  Disposition Final User 07/31/2020 11:29:07 AM Send to [name], RN, April 07/31/2020 12:31:51 PM Pharmacy Call [name], RN, [name] Reason: Festus Barren [phone] 07/31/2020 12:32:47 PM Called On-Call Provider [name], RN, [name] 07/31/2020 12:43:46 PM Clinical Call Yes [name], RN, [name] NOTE: All timestamps contained within this report are represented as [place] Standard Time. CONFIDENTIALTY NOTICE: This fax transmission is intended only for the addressee. It contains information that is legally privileged, confidential or otherwise protected from use or disclosure. If you are not the intended recipient, you are strictly prohibited from reviewing, disclosing, copying using or disseminating any of this information or taking any action in reliance on or regarding this information. If you have received this fax in error, please notify us immediately by telephone so that we can arrange for its return to [place]. Phone: [phone], Toll-Free: [phone], Fax: [phone] Page: 2 of 2 Call Id: [id number] Comments User: [name], RN Date/Time [name]: 07/31/2020 12:41:30 PM [name] to handle the medication. Pharmacy advised that the pharmacist is dealing with a emergency. [name]. On call will call back in 30 minutes. On call is looking up information and I will call the patient and let the patient know that the on call is working on the RX issue. User: [name], RN Date/Time [name]: 07/31/2020 12:43:25 PM Caller made aware that on call is working on looking up med and working with the pharmacy. No questions at this time. User: [name], RN Date/Time [name]: 07/31/2020 12:45:30 PM Pharmacy, [name], advised that the rx was for 120 pills, 2 pills  a day, insurance will not cover. Caller advised that he can change to 60 pills and insurance would cover. On call notified. Paging DoctorName Phone DateTime Result/ Outcome  Message Type Notes [name] [phone] 07/31/2020 12:32:47 PM Called On Call Provider - Reached Doctor Paged [name] 07/31/2020 12:38:34 PM Spoke with On Call - General Message Result [name] to handle the medication. Pharmacy advised that the pharmascist is dealing with a emergency. [name]. On call will call back in 30 minutes.

## 2020-08-03 ENCOUNTER — Telehealth: Payer: Self-pay | Admitting: Cardiovascular Disease

## 2020-08-03 DIAGNOSIS — I4891 Unspecified atrial fibrillation: Secondary | ICD-10-CM | POA: Diagnosis not present

## 2020-08-03 DIAGNOSIS — R0602 Shortness of breath: Secondary | ICD-10-CM | POA: Diagnosis not present

## 2020-08-03 DIAGNOSIS — I890 Lymphedema, not elsewhere classified: Secondary | ICD-10-CM | POA: Diagnosis not present

## 2020-08-03 DIAGNOSIS — I5033 Acute on chronic diastolic (congestive) heart failure: Secondary | ICD-10-CM | POA: Diagnosis not present

## 2020-08-03 DIAGNOSIS — J9601 Acute respiratory failure with hypoxia: Secondary | ICD-10-CM | POA: Diagnosis not present

## 2020-08-03 DIAGNOSIS — I110 Hypertensive heart disease with heart failure: Secondary | ICD-10-CM | POA: Diagnosis not present

## 2020-08-03 DIAGNOSIS — I2510 Atherosclerotic heart disease of native coronary artery without angina pectoris: Secondary | ICD-10-CM | POA: Diagnosis not present

## 2020-08-03 DIAGNOSIS — R0902 Hypoxemia: Secondary | ICD-10-CM | POA: Diagnosis not present

## 2020-08-03 NOTE — Telephone Encounter (Signed)
Patient c/o Palpitations:  High priority if patient c/o lightheadedness, shortness of breath, or chest pain  1) How long have you had palpitations/irregular HR/ Afib? Are you having the symptoms now? Started about 3:30 am and lasted about 45 minutes. Patient feeling better now  2) Are you currently experiencing lightheadedness, SOB or CP? experienced chest pain, SOB and shoulders/arm were hurting   3) Do you have a history of afib (atrial fibrillation) or irregular heart rhythm? Yes,a fib  4) Have you checked your BP or HR? (document readings if available): 120/70  60  5) Are you experiencing any other symptoms?   EMS came and patient chose to not go to ED. Patient curious if amiodarone RX should be changed

## 2020-08-03 NOTE — Telephone Encounter (Signed)
Was able to return call to Tina Pacheco, she reports early this morning around 03:30 am she woke up with having CP and SOB, CP radiated into her left shoulder and her left arm was hurting. Reports it lasted only about 45 mins, did not check BP or pulse, but reports felt "my heart like it was doing something" so pt took a half pill of amiodarone.  Pt had EMS come out, they assess her and contained vitals BP 120/70 HR 60, EKG showed Afib per pt, but controlled rate. Pt refused transport to the ED for evaluation since "everything looked good and I felt better".   Advised pt that given her symptoms she reported could warrant a cardiac work-up where troponin marks can be obtain along with cardiac scans (xray or CT). Educated to pt that even though her "vitals and EKG" were "okay" that does not mean a cardiac event did or did not occur. Most time the troponin (by obtaining labs) is the best indicator to let us know if there was some sort of cardia event.   Question pt regarding her Nitro, pt reports she did not know she had any, reports they were given to her while in the hospital but did not know she had a script for them. Advised pt that they are at her [facility] with 12 refills. Educated pt on how to take Nitro during episodes of having CP instead of reaching to her amiodarone as first choice.   For as needed Nitroglycerin, if you develop chest pain:  Sit and rest 5 minutes. If chest pain does not resolve place 1 nitroglycerin under your tongue and wait 5 minutes.  If chest pain does not resolve, place a 2nd nitroglycerin under your tongue and wait 5 more minutes.  If chest pain does not resolve, place a 3rd nitroglycerin under your tongue and seek emergency services.  [name] verbalized understanding, is very thankful for the quick return phone call and information on Nitro, reports she is calling pharmacy to have them filled and pick them up today, will utilize when episodes of CP  occurs, pt understands when to call 911 for medical needs and that may need to seek ED for full cardiac work-up, at this time she reframes from being seen d/t "feeling better".   Will call back with anything further or if another even occurs.

## 2020-08-05 ENCOUNTER — Telehealth: Payer: Self-pay | Admitting: Internal Medicine

## 2020-08-05 ENCOUNTER — Ambulatory Visit (INDEPENDENT_AMBULATORY_CARE_PROVIDER_SITE_OTHER): Payer: Medicare Other

## 2020-08-05 DIAGNOSIS — I5032 Chronic diastolic (congestive) heart failure: Secondary | ICD-10-CM

## 2020-08-05 DIAGNOSIS — I480 Paroxysmal atrial fibrillation: Secondary | ICD-10-CM | POA: Diagnosis not present

## 2020-08-05 DIAGNOSIS — I4891 Unspecified atrial fibrillation: Secondary | ICD-10-CM | POA: Diagnosis not present

## 2020-08-05 DIAGNOSIS — J9601 Acute respiratory failure with hypoxia: Secondary | ICD-10-CM | POA: Diagnosis not present

## 2020-08-05 DIAGNOSIS — I2510 Atherosclerotic heart disease of native coronary artery without angina pectoris: Secondary | ICD-10-CM | POA: Diagnosis not present

## 2020-08-05 DIAGNOSIS — I5033 Acute on chronic diastolic (congestive) heart failure: Secondary | ICD-10-CM | POA: Diagnosis not present

## 2020-08-05 DIAGNOSIS — I890 Lymphedema, not elsewhere classified: Secondary | ICD-10-CM

## 2020-08-05 DIAGNOSIS — I110 Hypertensive heart disease with heart failure: Secondary | ICD-10-CM | POA: Diagnosis not present

## 2020-08-05 DIAGNOSIS — Z9181 History of falling: Secondary | ICD-10-CM

## 2020-08-05 NOTE — Telephone Encounter (Signed)
Called pt stating she is ready for a home health aide. She has a neighbor who works for a company and has been talking to her about it. She has a form that will need to be filled out and faxed back. She will have someone help her get it attached to MyChart and sent to [place] over the weekend

## 2020-08-05 NOTE — Chronic Care Management (AMB) (Signed)
Chronic Care Management   CCM RN Visit Note  08/05/2020 Name: Tina Pacheco MRN: [id number] DOB: 03-Jul-1950  Subjective: Tina Pacheco is a 70 y.o. year old female who is a primary care patient of Venia Carbon, MD. The care management team was consulted for assistance with disease management and care coordination needs.    Engaged with patient by telephone for follow up visit in response to provider referral for case management and/or care coordination services.   Consent to Services:  The patient was given information about Chronic Care Management services, agreed to services, and gave verbal consent prior to initiation of services.  Please see initial visit note for detailed documentation.   Patient agreed to services and verbal consent obtained.   Assessment: Review of patient past medical history, allergies, medications, health status, including review of consultants reports, laboratory and other test data, was performed as part of comprehensive evaluation and provision of chronic care management services.   SDOH (Social Determinants of Health) assessments and interventions performed:    CCM Care Plan  Allergies  Allergen Reactions   Fluoxetine     Headache, shaking, sleep issues   Codeine     Nausea and vomiting/only when taking too much    Outpatient Encounter Medications as of 08/05/2020  Medication Sig Note   amiodarone (PACERONE) 200 MG tablet Take 1 tablet (200 mg total) by mouth daily. Take one tab daily.    apixaban (ELIQUIS) 5 MG TABS tablet Take 1 tablet (5 mg total) by mouth 2 (two) times daily.    atorvastatin (LIPITOR) 20 MG tablet TAKE 1 TABLET BY MOUTH DAILY AT 6 PM    Certolizumab Pegol (CIMZIA [place]) Inject into the skin every 30 (thirty) days.    Crisaborole (EUCRISA) 2 % OINT Apply to aa's BID PRN flares.    gabapentin (NEURONTIN) 300 MG capsule Take 1 capsule (300 mg total) by mouth 4 (four) times daily. 07/06/2020: Patient states she takes as needed    ketoconazole (NIZORAL) 2 % cream Apply 1 application topically 2 (two) times daily. 08/05/2020: Patient states she takes as needed   leflunomide (ARAVA) 20 MG tablet Take 20 mg by mouth daily.    meclizine (ANTIVERT) 25 MG tablet TAKE 1 TABLET(25 MG) BY MOUTH THREE TIMES DAILY AS NEEDED FOR DIZZINESS    metolazone (ZAROXOLYN) 2.5 MG tablet Take 2.5 mg by mouth daily as needed. 08/05/2020: Patient states if weight goes up to 310lb   metoprolol succinate (TOPROL-XL) 50 MG 24 hr tablet TAKE 1 TABLET BY MOUTH EVERY DAY WITH OR IMMEDIATELY FOLLOWING A MEAL    montelukast (SINGULAIR) 10 MG tablet TAKE 1 TABLET BY MOUTH AT BEDTIME    nitroGLYCERIN (NITROSTAT) 0.4 MG SL tablet Place 1 tablet (0.4 mg total) under the tongue every 5 (five) minutes as needed for chest pain.    nystatin (MYCOSTATIN/NYSTOP) powder Apply 1 application topically 3 (three) times daily as needed.    Oxycodone HCl 20 MG TABS Take 1 tablet (20 mg total) by mouth in the morning and at bedtime.    polyethylene glycol (MIRALAX / GLYCOLAX) packet Take 17 g by mouth daily as needed for mild constipation.    Potassium Chloride ER 20 MEQ TBCR Take 20 mEq by mouth in the morning and at bedtime.    SF 5000 PLUS 1.1 % CREA dental cream Place 1 application onto teeth 2 (two) times daily.    spironolactone (ALDACTONE) 25 MG tablet TAKE 1 TABLET(25 MG) BY MOUTH DAILY  terazosin (HYTRIN) 10 MG capsule TAKE 1 CAPSULE(10 MG) BY MOUTH AT BEDTIME    torsemide (DEMADEX) 20 MG tablet Take 40 mg by mouth daily. Takes an extra tablet when feeling SOB or has noticeable edema    mupirocin ointment (BACTROBAN) 2 % Apply 1 application topically 2 (two) times daily. Qd to burn wounds (Patient not taking: Reported on 08/05/2020)    No facility-administered encounter medications on file as of 08/05/2020.    Patient Active Problem List   Diagnosis Date Noted   Stage 3b chronic kidney disease ([place]) 07/07/2020   Abnormal nuclear stress test    Demand ischemia [facility])     Atrial fibrillation with rapid ventricular response ([place]) 05/28/2020   Polymyalgia rheumatica ([place]) 02/03/2020   Debility 01/28/2020   Intertrigo of genitocrural region due to Candida species 01/05/2020   Intertrigo 01/05/2020   Heart failure ([place]) 01/05/2020   Chronic diastolic heart failure (H[facility])    Pressure injury of skin 03/31/2019   COPD (chronic obstructive pulmonary disease) ([place]) 03/30/2019   Urge incontinence of urine 12/23/2018   Left leg pain 09/18/2018   Chronic gouty arthropathy without tophi 04/01/2018   Preventative health care 03/20/2018   Mood disorder ([place]) 03/20/2018   Advance directive discussed with patient 03/20/2018   Lymphedema 11/26/2017   Narcotic dependence ([place]) 03/09/2017   Vertigo 06/28/2016   Bronchiectasis without acute exacerbation ([place]) 06/15/2015   DDD (degenerative disc disease), lumbosacral 03/30/2015   Sleep apnea    Atherosclerotic heart disease of native coronary artery with angina pectoris ([place]) 08/25/2014   Paroxysmal A-fib ([place]) 08/25/2014   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   RLS (restless legs syndrome) 07/06/2014   Gout 05/26/2014   Chronic pain syndrome 05/26/2014   Migraine 04/08/2014   Acquired deformity of arm 01/05/2014   Back pain 07/22/2013   Metatarsalgia of right foot 07/22/2013   Insomnia 07/15/2013   OA (osteoarthritis) 11/20/2012   DJD (degenerative joint disease) of cervical spine 09/10/2012   Trapezius muscle spasm 07/21/2011   Allergic rhinitis 07/10/2011   Rheumatoid arthritis ([place]) 03/06/2011   Morbid obesity ([place]) 06/22/2010   HLD (hyperlipidemia) 06/22/2010   HTN (hypertension) 06/22/2010    Conditions to be addressed/monitored:Atrial Fibrillation, CHF, and Lymphedema, falls  Care Plan : Cardiovascular  Updates made by Dannielle Karvonen, RN since 08/05/2020 12:00 AM   Problem: Knowledge deficit related to long term care management of heart failure and atrial fibrillation.   Priority: High    Long-Range Goal: Symptom exacerbation for atrial fibrillation/ heart failure prevented/ minimized   Start Date: 01/08/2020  Expected End Date: 10/27/2020  This Visit's Progress: On track  Recent Progress: On track  Priority: High  Current Barriers: Patient states she had to call EMS 2 days ago due to symptoms of chest/ shoulder pain and shortness of breath.  She states she was found to be in A-Fib but her symptoms resolved.  Patient states she also did not have nitroglycerin on hand.  She reports having it now.  Patient denies any shortness of breath or increase in swelling at this time.  She states she is taking her medications as prescribed. She reports having ongoing care with [place].  Patient reports today's blood pressure was 120/82, Pulse 48.  Patient states she had a follow up visit with her cardiologist on 07/30/2020. She states her weight is down to 300 lbs.  Patient states she continues to need assistance with her ADL's and IADL's.  She states she becomes "  worn out" when trying to do her own bathing and states she has difficulty walking even when using her Rollator.  Patient states she needs in home assistance with her care. She reports having a secondary insurance plan that may cover in home care needs. She states she will need the doctor to fill out a form showing she requires the assistance.   RNCM advised patient to have the home health nurse demonstrate how to use the blood pressure monitor. Patient verbalized agreement.  Knowledge deficit related to basic pathophysiology and self care management of heart failure and atrial fibrillation No Advanced Directives in place:    Nurse Case Manager Clinical Goal(s):  patient will take all medications as prescribed and will call provider and / or RN care manager for medication related questions patient will verbalize understanding of Afib and heart failure Action Plan and when to call doctor patient will attend scheduled medical  appointments:  Patient reports having her follow up visit with the vascular doctor on 07/13/2020 and her cardiologist on 07/30/2020. Patient states both doctors agreed to manage her condition conservatively with ongoing healthy eating/ weight management.   patient will work with CM clinical Education officer, museum regarding anxiety/ depression symptoms.  the patient will demonstrate ongoing self health care management ability as evidenced by weighing and recording weights daily, checking blood pressure daily, taking medications as prescribed, and adhering to a heart healthy low sodium diet.  the patient will work with the care management team towards completion of advanced directives Interventions:  Collaboration with Venia Carbon, MD regarding development and update of comprehensive plan of care as evidenced by provider attestation and co-signature Inter-disciplinary care team collaboration (see longitudinal plan of care) Discussed basic overview of afib and heart failure disease state provided Reviewed and discussed medications  Referred patient to social worker for anxiety/ depression symptoms.  Discussed heart healthy diet low sodium diet. Advised to monitor portion sizes, limit between meal snack, and consider caloric deficit meal planning (dieting education information sent to client in [place]) Discussed importance of daily weight and advised patient to weigh and record daily Advised patient, providing education and rationale, to monitor blood pressure daily and record. Contact doctor for concerns.  Reviewed scheduled/upcoming provider appointments. Discussed plans with patient for ongoing care management follow up and provided patient with direct contact information for care management team. Self-Care Activities: Self administers medications as prescribed.  Attends all scheduled provider appointments Calls pharmacy for medication refills Calls provider office for new concerns or questions Patient  Goals: - check pulse (heart) rate once a day (you can purchase a pulse oximetry at any drug store) - Continue to take your medicine as prescribed - eat more whole grains, fruits and vegetables, lean meats and healthy fats and follow a low salt diet (see education article on dieting in your MyChart) - follow rescue plan if symptoms flare-up (Follow your doctor instructions if you have shortness of breath and increase swelling) - Call your doctor if you have questions or concerns. - call office if I gain more than 2 pounds in one day or 5 pounds in one week (call doctor for these symptoms) and/ or Atrial fibrillation symptoms - do ankle pumps when sitting - keep legs up while sitting - watch for swelling in feet, ankles and legs every day (report increase to your doctor) - weigh yourself daily and record in log/ diary - Contact your provider office regarding need for in home personal care services and submit insurance form for  completion.  Follow Up Plan: The patient has been provided with contact information for the care management team and has been advised to call with any health related questions or concerns.  The care management team will reach out to the patient again over the next 30 days.       Care Plan : lymphedema  Updates made by Dannielle Karvonen, RN since 08/05/2020 12:00 AM   Problem: knowledge deficit related to long term care self management of lymphedema and risk for falls   Priority: High   Long-Range Goal: Lymphedema symptoms minimized/ fall prevention   Start Date: 07/05/2020  Expected End Date: 10/27/2020  This Visit's Progress: On track  Recent Progress: On track  Priority: High  Current Barriers:  Knowledge deficits related to long term self health management of lymphedema and fall prevention:  Patient reports seeing the vascular doctor on 07/13/2020. She states he wants her to continue to work on dieting / weight loss. She states her legs were not as stiff and hard.  Does  not want to consider Lymphedema compression pumps at this time. Would like to take a more conservative approach.  Patient states she had a follow up visit with her cardiologist on 07/30/2020. She states her weight is down to 300 lbs and he to would like for her to continue to work on the weight loss.   She reports having ongoing care with [facility].  Patient reports today's blood pressure was 120/82, Pulse 48.   Patient states she continues to need assistance with her ADL's and IADL's.  She states she becomes "worn out" when trying to do her own bathing and states she has difficulty walking even when using her Rollator.  Patient states she needs in home assistance with her care. She reports having a secondary insurance plan that may cover in home care needs. She states she will need the doctor to fill out a form showing she requires the assistance.   RNCM advised patient to have the home health nurse demonstrate how to use the blood pressure monitor. Patient verbalized agreement. Clinical Goal(s):  Collaboration with Venia Carbon, MD regarding development and update of comprehensive plan of care as evidenced by provider attestation and co-signature Inter-disciplinary care team collaboration as needed (see longitudinal plan of care) patient will demonstrate improved adherence to prescribed treatment plan for lymphedema and decreasing falls as evidenced by patient reporting and review of EMR patient will work with care management team to address care coordination and chronic disease management needs  Patient will take her medications as prescribed.  Patient will attend scheduled medical appointments.  Interventions:  Evaluation of current treatment plan related to Atrial Fibrillation and Fall prevention Collaboration with Venia Carbon, MD regarding development and update of comprehensive plan of care as evidenced by provider attestation       and co-signature Inter-disciplinary care  team collaboration (see longitudinal plan of care) Provided patient with verbal and written education material on lymphedema and falls prevention Discussed plans with patient for ongoing care management follow up and provided patient with direct contact information for care management team Medication review completed with patient.  Fall risk reduction strategies discussed with patient.  Reviewed scheduled / upcoming provider appointments.  Patient Goals: - utilize assistive device Agricultural consultant) appropriately when ambulating. - Follow fall prevention recommendations:  Make sure there is good lighting throughout your home Make sure walkways are clear of clutter, cords and throw rugs Make sure furniture is secure, sturdy and  does not swivel Grab bars are suggested near the toilet, tub and shower Turn on the lights when you go into a dark area.  Use night- lights Keep items that you use often in easy to reach places.  - Keep follow up appointments with your providers - Elevate legs when sitting or lying down.  - Clean your skin daily. Apply lotion to prevent dry skin.  - Eat a diet rich in fruits and vegetables. - Contact your doctor for increased or new symptoms or concerns.  - Continue to take your medications as prescribed.  - Contact your provider office regarding need for in home personal care services and submit insurance form for completion.   Follow Up Plan: The patient has been provided with contact information for the care management team and has been advised to call with any health related questions or concerns.  The care management team will reach out to the patient again over the next 30 days.         Plan:The patient has been provided with contact information for the care management team and has been advised to call with any health related questions or concerns.  and The care management team will reach out to the patient again over the next 30 days. Quinn Plowman RN,BSN,CCM RN  Case Manager [place]  [phone]

## 2020-08-05 NOTE — Telephone Encounter (Signed)
Mrs. Florio called in wanted to speak with Ms.Larene Beach about getting a home health company to come out to her house.

## 2020-08-05 NOTE — Patient Instructions (Signed)
Visit Information:  Thank you for taking the time to speak with me today.   PATIENT GOALS:  Goals Addressed             This Visit's Progress    Management of lymphedema and falls   On track    Timeframe:  Long-Range Goal Priority:  High Start Date: 07/06/2020                            Expected End Date:  10/27/2020  Follow up: 09/07/2020  Patient Goals:   - utilize assistive device (Rollator) appropriately when ambulating. - Follow fall prevention recommendations:  Make sure there is good lighting throughout your home Make sure walkways are clear of clutter, cords and throw rugs Make sure furniture is secure, sturdy and does not swivel Grab bars are suggested near the toilet, tub and shower Turn on the lights when you go into a dark area.  Use night- lights Keep items that you use often in easy to reach places.  - Keep follow up appointments with your providers - Elevate legs when sitting or lying down.  - Clean your skin daily. Apply lotion to prevent dry skin.  - Eat a diet rich in fruits and vegetables. - Contact your doctor for increased or new symptoms or concerns.  - Continue to take your medications as prescribed.  - Contact your provider office regarding need for in home personal care services and submit insurance form for completion.                     Track and Manage Symptoms related to Atrial fibrillation and heart failure   On track    Timeframe:  Long-Range Goal Priority:  High Start Date:  07/06/2020                     Expected End Date:  10/27/2020                  Follow Up Date 09/07/2020  Patient Goals: - check pulse (heart) rate once a day (you can purchase a pulse oximetry at any drug store) - Continue to take your medicine as prescribed - eat more whole grains, fruits and vegetables, lean meats and healthy fats and follow a low salt diet (see education article on dieting in your MyChart) - follow rescue plan if symptoms flare-up (Follow your doctor  instructions if you have shortness of breath and increase swelling) - Call your doctor if you have questions or concerns. - call office if I gain more than 2 pounds in one day or 5 pounds in one week (call doctor for these symptoms) and/ or Atrial fibrillation symptoms - do ankle pumps when sitting - keep legs up while sitting - watch for swelling in feet, ankles and legs every day (report increase to your doctor) - weigh yourself daily and record in log/ diary   Why is this important?   You will be able to handle your symptoms better if you keep track of them.  Making some simple changes to your lifestyle will help.  Eating healthy is one thing you can do to take good care of yourself.           Patient verbalizes understanding of instructions provided today and agrees to view in [place].   The patient has been provided with contact information for the care management team and has been advised to  call with any health related questions or concerns.  The care management team will reach out to the patient again over the next 30 days.   Quinn Plowman RN,BSN,CCM RN Case Manager [place]  [phone]

## 2020-08-06 DIAGNOSIS — I890 Lymphedema, not elsewhere classified: Secondary | ICD-10-CM | POA: Diagnosis not present

## 2020-08-06 DIAGNOSIS — J9601 Acute respiratory failure with hypoxia: Secondary | ICD-10-CM | POA: Diagnosis not present

## 2020-08-06 DIAGNOSIS — I110 Hypertensive heart disease with heart failure: Secondary | ICD-10-CM | POA: Diagnosis not present

## 2020-08-06 DIAGNOSIS — I2510 Atherosclerotic heart disease of native coronary artery without angina pectoris: Secondary | ICD-10-CM | POA: Diagnosis not present

## 2020-08-06 DIAGNOSIS — I5033 Acute on chronic diastolic (congestive) heart failure: Secondary | ICD-10-CM | POA: Diagnosis not present

## 2020-08-06 DIAGNOSIS — I4891 Unspecified atrial fibrillation: Secondary | ICD-10-CM | POA: Diagnosis not present

## 2020-08-09 NOTE — Telephone Encounter (Signed)
Form printed and placed in Dr Alla German inbox on his desk. Pt is aware he was out of the office all last week and it may be a few days before this is ready.

## 2020-08-11 DIAGNOSIS — I110 Hypertensive heart disease with heart failure: Secondary | ICD-10-CM | POA: Diagnosis not present

## 2020-08-11 DIAGNOSIS — I890 Lymphedema, not elsewhere classified: Secondary | ICD-10-CM | POA: Diagnosis not present

## 2020-08-11 DIAGNOSIS — I5033 Acute on chronic diastolic (congestive) heart failure: Secondary | ICD-10-CM | POA: Diagnosis not present

## 2020-08-11 DIAGNOSIS — J9601 Acute respiratory failure with hypoxia: Secondary | ICD-10-CM | POA: Diagnosis not present

## 2020-08-11 DIAGNOSIS — I2510 Atherosclerotic heart disease of native coronary artery without angina pectoris: Secondary | ICD-10-CM | POA: Diagnosis not present

## 2020-08-11 DIAGNOSIS — I4891 Unspecified atrial fibrillation: Secondary | ICD-10-CM | POA: Diagnosis not present

## 2020-08-12 DIAGNOSIS — M0579 Rheumatoid arthritis with rheumatoid factor of multiple sites without organ or systems involvement: Secondary | ICD-10-CM | POA: Diagnosis not present

## 2020-08-12 DIAGNOSIS — I110 Hypertensive heart disease with heart failure: Secondary | ICD-10-CM | POA: Diagnosis not present

## 2020-08-12 DIAGNOSIS — I890 Lymphedema, not elsewhere classified: Secondary | ICD-10-CM | POA: Diagnosis not present

## 2020-08-12 DIAGNOSIS — J9601 Acute respiratory failure with hypoxia: Secondary | ICD-10-CM | POA: Diagnosis not present

## 2020-08-12 DIAGNOSIS — I4891 Unspecified atrial fibrillation: Secondary | ICD-10-CM | POA: Diagnosis not present

## 2020-08-12 DIAGNOSIS — I2510 Atherosclerotic heart disease of native coronary artery without angina pectoris: Secondary | ICD-10-CM | POA: Diagnosis not present

## 2020-08-12 DIAGNOSIS — I5033 Acute on chronic diastolic (congestive) heart failure: Secondary | ICD-10-CM | POA: Diagnosis not present

## 2020-08-18 DIAGNOSIS — I890 Lymphedema, not elsewhere classified: Secondary | ICD-10-CM | POA: Diagnosis not present

## 2020-08-18 DIAGNOSIS — I5033 Acute on chronic diastolic (congestive) heart failure: Secondary | ICD-10-CM | POA: Diagnosis not present

## 2020-08-18 DIAGNOSIS — J9601 Acute respiratory failure with hypoxia: Secondary | ICD-10-CM | POA: Diagnosis not present

## 2020-08-18 DIAGNOSIS — I110 Hypertensive heart disease with heart failure: Secondary | ICD-10-CM | POA: Diagnosis not present

## 2020-08-18 DIAGNOSIS — I2510 Atherosclerotic heart disease of native coronary artery without angina pectoris: Secondary | ICD-10-CM | POA: Diagnosis not present

## 2020-08-18 DIAGNOSIS — I4891 Unspecified atrial fibrillation: Secondary | ICD-10-CM | POA: Diagnosis not present

## 2020-08-19 DIAGNOSIS — I2510 Atherosclerotic heart disease of native coronary artery without angina pectoris: Secondary | ICD-10-CM | POA: Diagnosis not present

## 2020-08-19 DIAGNOSIS — I890 Lymphedema, not elsewhere classified: Secondary | ICD-10-CM | POA: Diagnosis not present

## 2020-08-19 DIAGNOSIS — I110 Hypertensive heart disease with heart failure: Secondary | ICD-10-CM | POA: Diagnosis not present

## 2020-08-19 DIAGNOSIS — I5033 Acute on chronic diastolic (congestive) heart failure: Secondary | ICD-10-CM | POA: Diagnosis not present

## 2020-08-19 DIAGNOSIS — I4891 Unspecified atrial fibrillation: Secondary | ICD-10-CM | POA: Diagnosis not present

## 2020-08-19 DIAGNOSIS — J9601 Acute respiratory failure with hypoxia: Secondary | ICD-10-CM | POA: Diagnosis not present

## 2020-08-24 DIAGNOSIS — M7551 Bursitis of right shoulder: Secondary | ICD-10-CM | POA: Diagnosis not present

## 2020-08-24 DIAGNOSIS — Z79899 Other long term (current) drug therapy: Secondary | ICD-10-CM | POA: Diagnosis not present

## 2020-08-24 DIAGNOSIS — M0579 Rheumatoid arthritis with rheumatoid factor of multiple sites without organ or systems involvement: Secondary | ICD-10-CM | POA: Diagnosis not present

## 2020-08-24 DIAGNOSIS — M1A00X0 Idiopathic chronic gout, unspecified site, without tophus (tophi): Secondary | ICD-10-CM | POA: Diagnosis not present

## 2020-08-24 DIAGNOSIS — M170 Bilateral primary osteoarthritis of knee: Secondary | ICD-10-CM | POA: Diagnosis not present

## 2020-08-25 DIAGNOSIS — J9601 Acute respiratory failure with hypoxia: Secondary | ICD-10-CM | POA: Diagnosis not present

## 2020-08-25 DIAGNOSIS — I2510 Atherosclerotic heart disease of native coronary artery without angina pectoris: Secondary | ICD-10-CM | POA: Diagnosis not present

## 2020-08-25 DIAGNOSIS — I890 Lymphedema, not elsewhere classified: Secondary | ICD-10-CM | POA: Diagnosis not present

## 2020-08-25 DIAGNOSIS — I4891 Unspecified atrial fibrillation: Secondary | ICD-10-CM | POA: Diagnosis not present

## 2020-08-25 DIAGNOSIS — I5033 Acute on chronic diastolic (congestive) heart failure: Secondary | ICD-10-CM | POA: Diagnosis not present

## 2020-08-25 DIAGNOSIS — I110 Hypertensive heart disease with heart failure: Secondary | ICD-10-CM | POA: Diagnosis not present

## 2020-08-26 ENCOUNTER — Telehealth: Payer: Self-pay | Admitting: Internal Medicine

## 2020-08-26 DIAGNOSIS — I110 Hypertensive heart disease with heart failure: Secondary | ICD-10-CM | POA: Diagnosis not present

## 2020-08-26 DIAGNOSIS — J9601 Acute respiratory failure with hypoxia: Secondary | ICD-10-CM | POA: Diagnosis not present

## 2020-08-26 DIAGNOSIS — I890 Lymphedema, not elsewhere classified: Secondary | ICD-10-CM | POA: Diagnosis not present

## 2020-08-26 DIAGNOSIS — I2510 Atherosclerotic heart disease of native coronary artery without angina pectoris: Secondary | ICD-10-CM | POA: Diagnosis not present

## 2020-08-26 DIAGNOSIS — I5033 Acute on chronic diastolic (congestive) heart failure: Secondary | ICD-10-CM | POA: Diagnosis not present

## 2020-08-26 DIAGNOSIS — I4891 Unspecified atrial fibrillation: Secondary | ICD-10-CM | POA: Diagnosis not present

## 2020-08-26 NOTE — Telephone Encounter (Signed)
  LAST APPOINTMENT DATE: 08/05/2020   NEXT APPOINTMENT DATE:@7 /01/2021  MEDICATION: oxycodone  PHARMACY: [facility]- [address]   Let patient know to contact pharmacy at the end of the day to make sure medication is ready.  Please notify patient to allow 48-72 hours to process  Encourage patient to contact the pharmacy for refills or they can request refills through [place]:   LAST REFILL:  QTY:  REFILL DATE:    OTHER COMMENTS:    Okay for refill?  Please advise

## 2020-08-27 ENCOUNTER — Telehealth: Payer: Self-pay

## 2020-08-27 ENCOUNTER — Telehealth: Payer: Self-pay | Admitting: Internal Medicine

## 2020-08-27 NOTE — Telephone Encounter (Signed)
That is fine 

## 2020-08-27 NOTE — Telephone Encounter (Signed)
Marlowe Kays OT with [facility] left v/m requesting verbal orders for hh OT to continue 1 x a wk for 4 wks

## 2020-08-27 NOTE — Telephone Encounter (Signed)
2nd call about medication refill   LAST APPOINTMENT DATE: 08/27/2020   NEXT APPOINTMENT DATE:@7 /01/2021  MEDICATION:Oxycodone HCl 20 MG TABS PHARMACY:[id number] [address], [place] -  Let patient know to contact pharmacy at the end of the day to make sure medication is ready.  Please notify patient to allow 48-72 hours to process  Encourage patient to contact the pharmacy for refills or they can request refills through [place]:   LAST REFILL:  QTY:  REFILL DATE:    OTHER COMMENTS:    Okay for refill?  Please advise

## 2020-08-30 DIAGNOSIS — Z79891 Long term (current) use of opiate analgesic: Secondary | ICD-10-CM | POA: Diagnosis not present

## 2020-08-30 DIAGNOSIS — N179 Acute kidney failure, unspecified: Secondary | ICD-10-CM | POA: Diagnosis not present

## 2020-08-30 DIAGNOSIS — Z7901 Long term (current) use of anticoagulants: Secondary | ICD-10-CM | POA: Diagnosis not present

## 2020-08-30 DIAGNOSIS — M1990 Unspecified osteoarthritis, unspecified site: Secondary | ICD-10-CM | POA: Diagnosis not present

## 2020-08-30 DIAGNOSIS — M47819 Spondylosis without myelopathy or radiculopathy, site unspecified: Secondary | ICD-10-CM | POA: Diagnosis not present

## 2020-08-30 DIAGNOSIS — M069 Rheumatoid arthritis, unspecified: Secondary | ICD-10-CM | POA: Diagnosis not present

## 2020-08-30 DIAGNOSIS — J9601 Acute respiratory failure with hypoxia: Secondary | ICD-10-CM | POA: Diagnosis not present

## 2020-08-30 DIAGNOSIS — I4891 Unspecified atrial fibrillation: Secondary | ICD-10-CM | POA: Diagnosis not present

## 2020-08-30 DIAGNOSIS — J449 Chronic obstructive pulmonary disease, unspecified: Secondary | ICD-10-CM | POA: Diagnosis not present

## 2020-08-30 DIAGNOSIS — K5900 Constipation, unspecified: Secondary | ICD-10-CM | POA: Diagnosis not present

## 2020-08-30 DIAGNOSIS — G4730 Sleep apnea, unspecified: Secondary | ICD-10-CM | POA: Diagnosis not present

## 2020-08-30 DIAGNOSIS — E785 Hyperlipidemia, unspecified: Secondary | ICD-10-CM | POA: Diagnosis not present

## 2020-08-30 DIAGNOSIS — E669 Obesity, unspecified: Secondary | ICD-10-CM | POA: Diagnosis not present

## 2020-08-30 DIAGNOSIS — I890 Lymphedema, not elsewhere classified: Secondary | ICD-10-CM | POA: Diagnosis not present

## 2020-08-30 DIAGNOSIS — I2510 Atherosclerotic heart disease of native coronary artery without angina pectoris: Secondary | ICD-10-CM | POA: Diagnosis not present

## 2020-08-30 DIAGNOSIS — E876 Hypokalemia: Secondary | ICD-10-CM | POA: Diagnosis not present

## 2020-08-30 DIAGNOSIS — I5033 Acute on chronic diastolic (congestive) heart failure: Secondary | ICD-10-CM | POA: Diagnosis not present

## 2020-08-30 DIAGNOSIS — M109 Gout, unspecified: Secondary | ICD-10-CM | POA: Diagnosis not present

## 2020-08-30 DIAGNOSIS — I110 Hypertensive heart disease with heart failure: Secondary | ICD-10-CM | POA: Diagnosis not present

## 2020-08-30 DIAGNOSIS — Z85828 Personal history of other malignant neoplasm of skin: Secondary | ICD-10-CM | POA: Diagnosis not present

## 2020-08-30 DIAGNOSIS — G629 Polyneuropathy, unspecified: Secondary | ICD-10-CM | POA: Diagnosis not present

## 2020-08-31 ENCOUNTER — Telehealth: Payer: Self-pay | Admitting: Internal Medicine

## 2020-08-31 ENCOUNTER — Other Ambulatory Visit: Payer: Self-pay | Admitting: Internal Medicine

## 2020-08-31 DIAGNOSIS — I110 Hypertensive heart disease with heart failure: Secondary | ICD-10-CM | POA: Diagnosis not present

## 2020-08-31 DIAGNOSIS — I4891 Unspecified atrial fibrillation: Secondary | ICD-10-CM | POA: Diagnosis not present

## 2020-08-31 DIAGNOSIS — I890 Lymphedema, not elsewhere classified: Secondary | ICD-10-CM | POA: Diagnosis not present

## 2020-08-31 DIAGNOSIS — J9601 Acute respiratory failure with hypoxia: Secondary | ICD-10-CM | POA: Diagnosis not present

## 2020-08-31 DIAGNOSIS — I2510 Atherosclerotic heart disease of native coronary artery without angina pectoris: Secondary | ICD-10-CM | POA: Diagnosis not present

## 2020-08-31 DIAGNOSIS — I5033 Acute on chronic diastolic (congestive) heart failure: Secondary | ICD-10-CM | POA: Diagnosis not present

## 2020-08-31 MED ORDER — OXYCODONE HCL 20 MG PO TABS: 1.0000 | ORAL_TABLET | Freq: Two times a day (BID) | ORAL | 0 refills | Status: DC

## 2020-08-31 NOTE — Telephone Encounter (Signed)
  Tag  Copy   Springer, Vida Roller, NT   Nurse [employer] Encounter  Signed  Creation Time:  08/27/2020  2:01 PM           Signed      2nd call about medication refill     LAST APPOINTMENT DATE: 08/27/2020    NEXT APPOINTMENT DATE:@7 /01/2021   MEDICATION:Oxycodone HCl 20 MG TABS PHARMACY:[id number] [address], [place] -  Let patient know to contact pharmacy at the end of the day to make sure medication is ready.   Please notify patient to allow 48-72 hours to process   Encourage patient to contact the pharmacy for refills or they can request refills through [place]:   LAST REFILL:   QTY:   REFILL DATE:       OTHER COMMENTS:     Okay for refill?   Please advise

## 2020-08-31 NOTE — Telephone Encounter (Signed)
  LAST APPOINTMENT DATE: 08/27/2020   NEXT APPOINTMENT DATE:@7 /01/2021  MEDICATION: oxycodone   PHARMACY: [facility]- [address]   Let patient know to contact pharmacy at the end of the day to make sure medication is ready.  Please notify patient to allow 48-72 hours to process  Encourage patient to contact the pharmacy for refills or they can request refills through [place]:   LAST REFILL:  QTY:  REFILL DATE:    OTHER COMMENTS:    Okay for refill?  Please advise

## 2020-08-31 NOTE — Telephone Encounter (Signed)
Name of Medication: oxycodone 10 mg Name of Pharmacy:[facility] [address] Last Fill or Written Date and Quantity: 07-29-20 #120 Last Office Visit and Type: 07-07-20 Next Office Visit and Type: 10-12-20 Last Controlled Substance Agreement Date:05/23/19  Last UDS:05/14/19

## 2020-08-31 NOTE — Telephone Encounter (Signed)
Spoke to pt. She said she wanted to let Dr Silvio Pate know she gets #60 and not #120 of oxycodone. So, she will make this rx last for 2 months and then the next rx will be for #60 in early September. We think confusing came from being in rehab.

## 2020-08-31 NOTE — Telephone Encounter (Signed)
Duplicate request

## 2020-08-31 NOTE — Telephone Encounter (Signed)
I have copied this on to the previous note

## 2020-08-31 NOTE — Telephone Encounter (Signed)
I called the pt to apologize for the delay in her medication not being sent. She knows this is not the usual for Dr Silvio Pate. I advised her he would be sending this to the pharmacy in the next hour.

## 2020-08-31 NOTE — Telephone Encounter (Signed)
Verbal orders left on verified VM for [facility]

## 2020-08-31 NOTE — Telephone Encounter (Signed)
[place] Night - Client TELEPHONE ADVICE RECORD Access[facility]urse Patient Name: [facility] Tina Pacheco Gender: Female DOB: [birth year] Age: 70 Y 32 M 24 D Return Phone Number: [phone] (Primary), [phone] (Secondary) Address: City/ State/ Zip: [place]   [address] Client [facility] Night - Client Client Site [place] Physician [name] Contact Type Call Who Is Calling Patient / Member / Family / Caregiver Call Type Triage / Clinical Relationship To Patient Self Return Phone Number [phone] (Primary) Chief Complaint Prescription Refill or Medication Request (non symptomatic) Reason for Call Medication Question / Request Initial Comment Caller states she is calling for her Oxycodone refill. Translation No Nurse Assessment Nurse: Hardin Negus, RN, Mardene Celeste Date/Time [name]: 08/28/2020 10:21:58 AM Please select the assessment type ---Refill Additional Documentation ---oxycodone Does the patient have enough medication to last until the office opens? ---No Disp. Time [name] Disposition Final User 08/28/2020 9:55:33 AM Send To Nurse Ria Comment, RN, April 08/28/2020 10:23:11 AM Clinical Call Yes Hardin Negus, RN, [place] Understands Yes

## 2020-08-31 NOTE — Telephone Encounter (Signed)
Patient call in stated she has questions about her medication  and would like a call back

## 2020-08-31 NOTE — Telephone Encounter (Signed)
Mrs. Aslinger called in wanted Larene [place] to call her its important its about a prescription

## 2020-08-31 NOTE — Telephone Encounter (Signed)
Vennie Homans E      Telephone Encounter  Signed  Creation Time:  08/26/2020  2:20 PM

## 2020-09-07 ENCOUNTER — Ambulatory Visit (INDEPENDENT_AMBULATORY_CARE_PROVIDER_SITE_OTHER): Payer: Medicare Other

## 2020-09-07 DIAGNOSIS — I5032 Chronic diastolic (congestive) heart failure: Secondary | ICD-10-CM | POA: Diagnosis not present

## 2020-09-07 DIAGNOSIS — I890 Lymphedema, not elsewhere classified: Secondary | ICD-10-CM

## 2020-09-07 DIAGNOSIS — I480 Paroxysmal atrial fibrillation: Secondary | ICD-10-CM

## 2020-09-07 DIAGNOSIS — Z9181 History of falling: Secondary | ICD-10-CM

## 2020-09-07 NOTE — Patient Instructions (Addendum)
Visit Information:  Thank you for taking the time to speak with me today.   PATIENT GOALS:  Goals Addressed             This Visit's Progress    Management of lymphedema and falls   On track    Timeframe:  Long-Range Goal Priority:  High Start Date: 07/06/2020                            Expected End Date:  11/26/2020  Follow up: 10/12/2020  Patient Goals:      - Continue to utilize assistive device (Rollator) appropriately when ambulating. -  Always follow fall prevention recommendations:  Make sure there is good lighting throughout your home Make sure walkways are clear of clutter, cords and throw rugs Make sure furniture is secure, sturdy and does not swivel Grab bars are suggested near the toilet, tub and shower Turn on the lights when you go into a dark area.  Use night- lights Keep items that you use often in easy to reach places.  - Keep follow up appointments with your providers - Continue to elevate legs when sitting or lying down.  - Clean your skin daily. Apply lotion to prevent dry skin.  - Eat a diet rich in fruits and vegetables. - Contact your doctor for increased or new symptoms or concerns.  - Continue to take your medications as prescribed.  - Contact your provider office regarding need for in home personal care services and submit insurance form for completion.                Track and Manage Symptoms related to Atrial fibrillation and heart failure   On track    Timeframe:  Long-Range Goal Priority:  High Start Date:  07/06/2020                     Expected End Date:  11/26/2020                 Follow Up Date 10/12/2020  Patient Goals: - Continue to check pulse (heart) rate once a day (you can purchase a pulse oximetry at any drug store) - Continue to take your medicine as prescribed - eat more whole grains, fruits and vegetables, lean meats and healthy fats and follow a low salt diet  - follow rescue plan if symptoms flare-up (Follow your doctor  instructions if you have shortness of breath and increase swelling) - Call your doctor if you have questions or concerns. - call office if you gain more than 3 pounds overnight or 5 pounds in one week (call doctor for these symptoms) and/ or Atrial fibrillation symptoms - Continue to do ankle pumps when sitting - keep legs up while sitting - watch for swelling in feet, ankles and legs every day (report increase to your doctor) - weigh yourself daily and record in log/ diary - Contact your provider office regarding need for in home personal care services and submit insurance form for completion.   Why is this important?   You will be able to handle your symptoms better if you keep track of them.  Making some simple changes to your lifestyle will help.  Eating healthy is one thing you can do to take good care of yourself.           Patient verbalizes understanding of instructions provided today and agrees to view in [place].   The  patient has been provided with contact information for the care management team and has been advised to call with any health related questions or concerns.  The care management team will reach out to the patient again over the next 45 days.   Quinn Plowman RN,BSN,CCM RN Case Manager [place]  [phone]

## 2020-09-07 NOTE — Chronic Care Management (AMB) (Signed)
Chronic Care Management   CCM RN Visit Note  09/08/2020 Name: Tina Pacheco MRN: [id number] DOB: Nov 20, 1950  Subjective: Tina Pacheco is a 70 y.o. year old female who is a primary care patient of Venia Carbon, MD. The care management team was consulted for assistance with disease management and care coordination needs.    Engaged with patient by telephone for follow up visit in response to provider referral for case management and/or care coordination services.   Consent to Services:  The patient was given information about Chronic Care Management services, agreed to services, and gave verbal consent prior to initiation of services.  Please see initial visit note for detailed documentation.   Patient agreed to services and verbal consent obtained.   Assessment: Review of patient past medical history, allergies, medications, health status, including review of consultants reports, laboratory and other test data, was performed as part of comprehensive evaluation and provision of chronic care management services.   SDOH (Social Determinants of Health) assessments and interventions performed:    CCM Care Plan  Allergies  Allergen Reactions   Fluoxetine     Headache, shaking, sleep issues   Codeine     Nausea and vomiting/only when taking too much    Outpatient Encounter Medications as of 09/07/2020  Medication Sig Note   amiodarone (PACERONE) 200 MG tablet Take 1 tablet (200 mg total) by mouth daily. Take one tab daily.    apixaban (ELIQUIS) 5 MG TABS tablet Take 1 tablet (5 mg total) by mouth 2 (two) times daily.    atorvastatin (LIPITOR) 20 MG tablet TAKE 1 TABLET BY MOUTH DAILY AT 6 PM    Certolizumab Pegol (CIMZIA [place]) Inject into the skin every 30 (thirty) days.    Crisaborole (EUCRISA) 2 % OINT Apply to aa's BID PRN flares.    gabapentin (NEURONTIN) 300 MG capsule Take 1 capsule (300 mg total) by mouth 4 (four) times daily. 07/06/2020: Patient states she takes as needed    ketoconazole (NIZORAL) 2 % cream Apply 1 application topically 2 (two) times daily. 08/05/2020: Patient states she takes as needed   leflunomide (ARAVA) 20 MG tablet Take 20 mg by mouth daily.    meclizine (ANTIVERT) 25 MG tablet TAKE 1 TABLET(25 MG) BY MOUTH THREE TIMES DAILY AS NEEDED FOR DIZZINESS    metolazone (ZAROXOLYN) 2.5 MG tablet Take 2.5 mg by mouth daily as needed. 08/05/2020: Patient states if weight goes up to 310lb   metoprolol succinate (TOPROL-XL) 50 MG 24 hr tablet TAKE 1 TABLET BY MOUTH EVERY DAY WITH OR IMMEDIATELY FOLLOWING A MEAL    montelukast (SINGULAIR) 10 MG tablet TAKE 1 TABLET BY MOUTH AT BEDTIME    mupirocin ointment (BACTROBAN) 2 % Apply 1 application topically 2 (two) times daily. Qd to burn wounds (Patient not taking: Reported on 08/05/2020)    nitroGLYCERIN (NITROSTAT) 0.4 MG SL tablet Place 1 tablet (0.4 mg total) under the tongue every 5 (five) minutes as needed for chest pain.    nystatin (MYCOSTATIN/NYSTOP) powder Apply 1 application topically 3 (three) times daily as needed.    Oxycodone HCl 20 MG TABS Take 1 tablet (20 mg total) by mouth in the morning and at bedtime.    polyethylene glycol (MIRALAX / GLYCOLAX) packet Take 17 g by mouth daily as needed for mild constipation.    Potassium Chloride ER 20 MEQ TBCR Take 20 mEq by mouth in the morning and at bedtime.    SF 5000 PLUS 1.1 % CREA dental  cream Place 1 application onto teeth 2 (two) times daily.    spironolactone (ALDACTONE) 25 MG tablet TAKE 1 TABLET(25 MG) BY MOUTH DAILY    terazosin (HYTRIN) 10 MG capsule TAKE 1 CAPSULE(10 MG) BY MOUTH AT BEDTIME    torsemide (DEMADEX) 20 MG tablet Take 40 mg by mouth daily. Takes an extra tablet when feeling SOB or has noticeable edema    No facility-administered encounter medications on file as of 09/07/2020.    Patient Active Problem List   Diagnosis Date Noted   Stage 3b chronic kidney disease ([place]) 07/07/2020   Abnormal nuclear stress test    Demand ischemia  [facility])    Atrial fibrillation with rapid ventricular response ([place]) 05/28/2020   Polymyalgia rheumatica ([place]) 02/03/2020   Debility 01/28/2020   Intertrigo of genitocrural region due to Candida species 01/05/2020   Intertrigo 01/05/2020   Heart failure ([place]) 01/05/2020   Chronic diastolic heart failure ([facility])    Pressure injury of skin 03/31/2019   COPD (chronic obstructive pulmonary disease) ([place]) 03/30/2019   Urge incontinence of urine 12/23/2018   Left leg pain 09/18/2018   Chronic gouty arthropathy without tophi 04/01/2018   Preventative health care 03/20/2018   Mood disorder ([place]) 03/20/2018   Advance directive discussed with patient 03/20/2018   Lymphedema 11/26/2017   Narcotic dependence ([place]) 03/09/2017   Vertigo 06/28/2016   Bronchiectasis without acute exacerbation ([place]) 06/15/2015   DDD (degenerative disc disease), lumbosacral 03/30/2015   Sleep apnea    Atherosclerotic heart disease of native coronary artery with angina pectoris ([place]) 08/25/2014   Paroxysmal A-fib ([place]) 08/25/2014   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   RLS (restless legs syndrome) 07/06/2014   Gout 05/26/2014   Chronic pain syndrome 05/26/2014   Migraine 04/08/2014   Acquired deformity of arm 01/05/2014   Back pain 07/22/2013   Metatarsalgia of right foot 07/22/2013   Insomnia 07/15/2013   OA (osteoarthritis) 11/20/2012   DJD (degenerative joint disease) of cervical spine 09/10/2012   Trapezius muscle spasm 07/21/2011   Allergic rhinitis 07/10/2011   Rheumatoid arthritis ([place]) 03/06/2011   Morbid obesity ([place]) 06/22/2010   HLD (hyperlipidemia) 06/22/2010   HTN (hypertension) 06/22/2010    Conditions to be addressed/monitored:Atrial Fibrillation, CHF, and Lymphedema, Falls  Care Plan : Cardiovascular  Updates made by Dannielle Karvonen, RN since 09/08/2020 12:00 AM   Problem: Knowledge deficit related to long term care management of heart failure and atrial fibrillation.    Priority: High   Long-Range Goal: Symptom exacerbation for atrial fibrillation/ heart failure prevented/ minimized   Start Date: 01/08/2020  Expected End Date: 11/26/2020  This Visit's Progress: On track  Recent Progress: On track  Priority: High  Current Barriers: Patient denies any new onset of symptoms related to her heart failure and atrial A-fib.  She reports her last cardiology visit was on 07/30/2020 with no changes to her treatment plan.   Patient states she continues to try to improve on her weight loss. She reports her current weight is 286 lbs down from 301 lbs.   Patient reports receiving the exercise book in the mail.  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   Knowledge deficit related to basic pathophysiology and self care management of heart failure and atrial fibrillation No Advanced Directives in place:    Nurse Case Manager Clinical Goal(s):  patient will  take all medications as prescribed and will call provider and / or RN care manager for medication related questions patient will verbalize understanding of Afib and heart failure Action Plan and when to call doctor patient will attend scheduled medical appointments:  Patient reports having her follow up visit with her primary provider on 10/12/2020.     patient will work with CM clinical Education officer, museum regarding anxiety/ depression symptoms.  the patient will demonstrate ongoing self health care management ability as evidenced by weighing and recording weights daily, checking blood pressure daily, taking medications as prescribed, and adhering to a heart healthy low sodium diet.  the patient will work with the care management team towards completion of advanced directives Interventions:  Collaboration with Venia Carbon, MD regarding development and update of comprehensive plan of care as evidenced by provider  attestation and co-signature Inter-disciplinary care team collaboration (see longitudinal plan of care) Discussed basic overview of afib and heart failure disease state provided Discussed heart healthy diet low sodium diet. Advised to monitor portion sizes, limit between meal snack, and consider caloric deficit meal planning (dieting education information sent to client in [place]) Discussed importance of daily weight and advised patient to weigh and record daily Advised patient to monitor blood pressure daily and record. Contact doctor for concerns.  Reviewed scheduled/upcoming provider appointments. Discussed plans with patient for ongoing care management follow up and provided patient with direct contact information for care management team. Self-Care Activities: Self administers medications as prescribed.  Attends all scheduled provider appointments Calls pharmacy for medication refills Calls provider office for new concerns or questions Patient Goals: - Continue to check pulse (heart) rate once a day (you can purchase a pulse oximetry at any drug store) - Continue to take your medicine as prescribed - eat more whole grains, fruits and vegetables, lean meats and healthy fats and follow a low salt diet  - follow rescue plan if symptoms flare-up (Follow your doctor instructions if you have shortness of breath and increase swelling) - Call your doctor if you have questions or concerns. - call office if you gain more than 3 pounds overnight or 5 pounds in one week (call doctor for these symptoms) and/ or Atrial fibrillation symptoms - Continue to do ankle pumps when sitting - keep legs up while sitting - watch for swelling in feet, ankles and legs every day (report increase to your doctor) - weigh yourself daily and record in log/ diary - Contact your provider office regarding need for in home personal care services and submit insurance form for completion.  Follow Up Plan: The patient has  been provided with contact information for the care management team and has been advised to call with any health related questions or concerns.  The care management team will reach out to the patient again over the next 45 days.       Care Plan : lymphedema  Updates made by Dannielle Karvonen, RN since 09/08/2020 12:00 AM   Problem: knowledge deficit related to long term care self management of lymphedema and risk for falls   Priority: High   Long-Range Goal: Lymphedema symptoms minimized/ fall prevention   Start Date: 07/05/2020  Expected End Date: 11/26/2020  This Visit's Progress: On track  Recent Progress: On track  Priority: High  Current Barriers:  Knowledge deficits related to long term self health management of lymphedema and fall prevention:  Patient states she continues to do well regarding her lymphedema.  She states she continues to  elevate her legs when sitting.  Denies any increase in swelling.  Patient states her next follow up appointment with her vascular doctor is 01/13/2021.  Patient states she continues to work with [facility] home health OT.  She states she also has a CNA 3 1/2 hours 3 days per week. She reports her current weight is 286 lbs. She states she continues to adhere to her diet plan.  Patient reports receiving the exercise book in the mail. RNCM encouraged her to review the book for chair exercises that she is able to tolerate.  Denies any recent falls.  Clinical Goal(s):  Collaboration with Venia Carbon, MD regarding development and update of comprehensive plan of care as evidenced by provider attestation and co-signature Inter-disciplinary care team collaboration as needed (see longitudinal plan of care) patient will demonstrate improved adherence to prescribed treatment plan for lymphedema and decreasing falls as evidenced by patient reporting and review of EMR patient will work with care management team to address care coordination and chronic disease  management needs  Patient will take her medications as prescribed.  Patient will attend scheduled medical appointments.  Interventions:  Evaluation of current treatment plan related to Atrial Fibrillation and Fall prevention Collaboration with Venia Carbon, MD regarding development and update of comprehensive plan of care as evidenced by provider attestation       and co-signature Inter-disciplinary care team collaboration (see longitudinal plan of care) Provided patient with verbal education  on lymphedema and falls prevention Discussed plans with patient for ongoing care management follow up and provided patient with direct contact information for care management team Medication review completed with patient.  Fall risk reduction strategies discussed with patient.  Reviewed scheduled / upcoming provider appointments.  Patient Goals: - Continue to utilize assistive device Agricultural consultant) appropriately when ambulating. -  Always follow fall prevention recommendations:  Make sure there is good lighting throughout your home Make sure walkways are clear of clutter, cords and throw rugs Make sure furniture is secure, sturdy and does not swivel Grab bars are suggested near the toilet, tub and shower Turn on the lights when you go into a dark area.  Use night- lights Keep items that you use often in easy to reach places.  - Keep follow up appointments with your providers - Continue to elevate legs when sitting or lying down.  - Clean your skin daily. Apply lotion to prevent dry skin.  - Eat a diet rich in fruits and vegetables. - Contact your doctor for increased or new symptoms or concerns.  - Continue to take your medications as prescribed.  - Contact your provider office regarding need for in home personal care services and submit insurance form for completion.   Follow Up Plan: The patient has been provided with contact information for the care management team and has been advised to call  with any health related questions or concerns.  The care management team will reach out to the patient again over the next 30 days.       Plan:The patient has been provided with contact information for the care management team and has been advised to call with any health related questions or concerns.  and The care management team will reach out to the patient again over the next 45 days. Quinn Plowman RN,BSN,CCM RN Case Manager [place]  [phone]

## 2020-09-08 DIAGNOSIS — I4891 Unspecified atrial fibrillation: Secondary | ICD-10-CM | POA: Diagnosis not present

## 2020-09-08 DIAGNOSIS — I5033 Acute on chronic diastolic (congestive) heart failure: Secondary | ICD-10-CM | POA: Diagnosis not present

## 2020-09-08 DIAGNOSIS — I2510 Atherosclerotic heart disease of native coronary artery without angina pectoris: Secondary | ICD-10-CM | POA: Diagnosis not present

## 2020-09-08 DIAGNOSIS — I110 Hypertensive heart disease with heart failure: Secondary | ICD-10-CM | POA: Diagnosis not present

## 2020-09-08 DIAGNOSIS — I890 Lymphedema, not elsewhere classified: Secondary | ICD-10-CM | POA: Diagnosis not present

## 2020-09-08 DIAGNOSIS — J9601 Acute respiratory failure with hypoxia: Secondary | ICD-10-CM | POA: Diagnosis not present

## 2020-09-09 DIAGNOSIS — I4891 Unspecified atrial fibrillation: Secondary | ICD-10-CM | POA: Diagnosis not present

## 2020-09-09 DIAGNOSIS — I5033 Acute on chronic diastolic (congestive) heart failure: Secondary | ICD-10-CM | POA: Diagnosis not present

## 2020-09-09 DIAGNOSIS — I890 Lymphedema, not elsewhere classified: Secondary | ICD-10-CM | POA: Diagnosis not present

## 2020-09-09 DIAGNOSIS — J9601 Acute respiratory failure with hypoxia: Secondary | ICD-10-CM | POA: Diagnosis not present

## 2020-09-09 DIAGNOSIS — I2510 Atherosclerotic heart disease of native coronary artery without angina pectoris: Secondary | ICD-10-CM | POA: Diagnosis not present

## 2020-09-09 DIAGNOSIS — I110 Hypertensive heart disease with heart failure: Secondary | ICD-10-CM | POA: Diagnosis not present

## 2020-09-13 DIAGNOSIS — M0579 Rheumatoid arthritis with rheumatoid factor of multiple sites without organ or systems involvement: Secondary | ICD-10-CM | POA: Diagnosis not present

## 2020-09-14 DIAGNOSIS — I5033 Acute on chronic diastolic (congestive) heart failure: Secondary | ICD-10-CM | POA: Diagnosis not present

## 2020-09-14 DIAGNOSIS — I110 Hypertensive heart disease with heart failure: Secondary | ICD-10-CM | POA: Diagnosis not present

## 2020-09-14 DIAGNOSIS — I4891 Unspecified atrial fibrillation: Secondary | ICD-10-CM | POA: Diagnosis not present

## 2020-09-14 DIAGNOSIS — I2510 Atherosclerotic heart disease of native coronary artery without angina pectoris: Secondary | ICD-10-CM | POA: Diagnosis not present

## 2020-09-14 DIAGNOSIS — I890 Lymphedema, not elsewhere classified: Secondary | ICD-10-CM | POA: Diagnosis not present

## 2020-09-14 DIAGNOSIS — J9601 Acute respiratory failure with hypoxia: Secondary | ICD-10-CM | POA: Diagnosis not present

## 2020-09-20 DIAGNOSIS — I890 Lymphedema, not elsewhere classified: Secondary | ICD-10-CM | POA: Diagnosis not present

## 2020-09-20 DIAGNOSIS — J9601 Acute respiratory failure with hypoxia: Secondary | ICD-10-CM | POA: Diagnosis not present

## 2020-09-20 DIAGNOSIS — I2510 Atherosclerotic heart disease of native coronary artery without angina pectoris: Secondary | ICD-10-CM | POA: Diagnosis not present

## 2020-09-20 DIAGNOSIS — I110 Hypertensive heart disease with heart failure: Secondary | ICD-10-CM | POA: Diagnosis not present

## 2020-09-20 DIAGNOSIS — I4891 Unspecified atrial fibrillation: Secondary | ICD-10-CM | POA: Diagnosis not present

## 2020-09-20 DIAGNOSIS — I5033 Acute on chronic diastolic (congestive) heart failure: Secondary | ICD-10-CM | POA: Diagnosis not present

## 2020-09-22 DIAGNOSIS — J9601 Acute respiratory failure with hypoxia: Secondary | ICD-10-CM | POA: Diagnosis not present

## 2020-09-22 DIAGNOSIS — I5033 Acute on chronic diastolic (congestive) heart failure: Secondary | ICD-10-CM | POA: Diagnosis not present

## 2020-09-22 DIAGNOSIS — I4891 Unspecified atrial fibrillation: Secondary | ICD-10-CM | POA: Diagnosis not present

## 2020-09-22 DIAGNOSIS — I2510 Atherosclerotic heart disease of native coronary artery without angina pectoris: Secondary | ICD-10-CM | POA: Diagnosis not present

## 2020-09-22 DIAGNOSIS — I890 Lymphedema, not elsewhere classified: Secondary | ICD-10-CM | POA: Diagnosis not present

## 2020-09-22 DIAGNOSIS — I110 Hypertensive heart disease with heart failure: Secondary | ICD-10-CM | POA: Diagnosis not present

## 2020-09-28 DIAGNOSIS — J9601 Acute respiratory failure with hypoxia: Secondary | ICD-10-CM | POA: Diagnosis not present

## 2020-09-28 DIAGNOSIS — I4891 Unspecified atrial fibrillation: Secondary | ICD-10-CM | POA: Diagnosis not present

## 2020-09-28 DIAGNOSIS — I5033 Acute on chronic diastolic (congestive) heart failure: Secondary | ICD-10-CM | POA: Diagnosis not present

## 2020-09-28 DIAGNOSIS — I2510 Atherosclerotic heart disease of native coronary artery without angina pectoris: Secondary | ICD-10-CM | POA: Diagnosis not present

## 2020-09-28 DIAGNOSIS — I110 Hypertensive heart disease with heart failure: Secondary | ICD-10-CM | POA: Diagnosis not present

## 2020-09-28 DIAGNOSIS — I890 Lymphedema, not elsewhere classified: Secondary | ICD-10-CM | POA: Diagnosis not present

## 2020-09-29 DIAGNOSIS — M069 Rheumatoid arthritis, unspecified: Secondary | ICD-10-CM

## 2020-09-29 DIAGNOSIS — G4730 Sleep apnea, unspecified: Secondary | ICD-10-CM

## 2020-09-29 DIAGNOSIS — I4891 Unspecified atrial fibrillation: Secondary | ICD-10-CM

## 2020-09-29 DIAGNOSIS — G629 Polyneuropathy, unspecified: Secondary | ICD-10-CM

## 2020-09-29 DIAGNOSIS — M47819 Spondylosis without myelopathy or radiculopathy, site unspecified: Secondary | ICD-10-CM | POA: Diagnosis not present

## 2020-09-29 DIAGNOSIS — J449 Chronic obstructive pulmonary disease, unspecified: Secondary | ICD-10-CM

## 2020-09-29 DIAGNOSIS — K5900 Constipation, unspecified: Secondary | ICD-10-CM | POA: Diagnosis not present

## 2020-09-29 DIAGNOSIS — E669 Obesity, unspecified: Secondary | ICD-10-CM

## 2020-09-29 DIAGNOSIS — E876 Hypokalemia: Secondary | ICD-10-CM | POA: Diagnosis not present

## 2020-09-29 DIAGNOSIS — I890 Lymphedema, not elsewhere classified: Secondary | ICD-10-CM | POA: Diagnosis not present

## 2020-09-29 DIAGNOSIS — E785 Hyperlipidemia, unspecified: Secondary | ICD-10-CM | POA: Diagnosis not present

## 2020-09-29 DIAGNOSIS — Z85828 Personal history of other malignant neoplasm of skin: Secondary | ICD-10-CM

## 2020-09-29 DIAGNOSIS — J9601 Acute respiratory failure with hypoxia: Secondary | ICD-10-CM

## 2020-09-29 DIAGNOSIS — Z7901 Long term (current) use of anticoagulants: Secondary | ICD-10-CM | POA: Diagnosis not present

## 2020-09-29 DIAGNOSIS — I5033 Acute on chronic diastolic (congestive) heart failure: Secondary | ICD-10-CM

## 2020-09-29 DIAGNOSIS — M109 Gout, unspecified: Secondary | ICD-10-CM

## 2020-09-29 DIAGNOSIS — M1990 Unspecified osteoarthritis, unspecified site: Secondary | ICD-10-CM

## 2020-09-29 DIAGNOSIS — Z79891 Long term (current) use of opiate analgesic: Secondary | ICD-10-CM | POA: Diagnosis not present

## 2020-09-29 DIAGNOSIS — I110 Hypertensive heart disease with heart failure: Secondary | ICD-10-CM

## 2020-09-29 DIAGNOSIS — N179 Acute kidney failure, unspecified: Secondary | ICD-10-CM

## 2020-09-29 DIAGNOSIS — I2510 Atherosclerotic heart disease of native coronary artery without angina pectoris: Secondary | ICD-10-CM

## 2020-10-06 DIAGNOSIS — I4891 Unspecified atrial fibrillation: Secondary | ICD-10-CM | POA: Diagnosis not present

## 2020-10-06 DIAGNOSIS — J9601 Acute respiratory failure with hypoxia: Secondary | ICD-10-CM | POA: Diagnosis not present

## 2020-10-06 DIAGNOSIS — I890 Lymphedema, not elsewhere classified: Secondary | ICD-10-CM | POA: Diagnosis not present

## 2020-10-06 DIAGNOSIS — I110 Hypertensive heart disease with heart failure: Secondary | ICD-10-CM | POA: Diagnosis not present

## 2020-10-06 DIAGNOSIS — I2510 Atherosclerotic heart disease of native coronary artery without angina pectoris: Secondary | ICD-10-CM | POA: Diagnosis not present

## 2020-10-06 DIAGNOSIS — I5033 Acute on chronic diastolic (congestive) heart failure: Secondary | ICD-10-CM | POA: Diagnosis not present

## 2020-10-11 ENCOUNTER — Encounter: Payer: Self-pay | Admitting: Internal Medicine

## 2020-10-12 ENCOUNTER — Other Ambulatory Visit: Payer: Self-pay

## 2020-10-12 ENCOUNTER — Ambulatory Visit (INDEPENDENT_AMBULATORY_CARE_PROVIDER_SITE_OTHER): Payer: Medicare Other | Admitting: Internal Medicine

## 2020-10-12 ENCOUNTER — Telehealth: Payer: Medicare Other

## 2020-10-12 ENCOUNTER — Encounter: Payer: Self-pay | Admitting: Internal Medicine

## 2020-10-12 ENCOUNTER — Telehealth: Payer: Self-pay | Admitting: *Deleted

## 2020-10-12 VITALS — BP 118/72 | HR 51 | Temp 97.2°F | Ht 63.0 in | Wt 308.0 lb

## 2020-10-12 DIAGNOSIS — I480 Paroxysmal atrial fibrillation: Secondary | ICD-10-CM | POA: Diagnosis not present

## 2020-10-12 DIAGNOSIS — I5032 Chronic diastolic (congestive) heart failure: Secondary | ICD-10-CM

## 2020-10-12 DIAGNOSIS — I2510 Atherosclerotic heart disease of native coronary artery without angina pectoris: Secondary | ICD-10-CM | POA: Diagnosis not present

## 2020-10-12 DIAGNOSIS — F1120 Opioid dependence, uncomplicated: Secondary | ICD-10-CM | POA: Diagnosis not present

## 2020-10-12 DIAGNOSIS — M0579 Rheumatoid arthritis with rheumatoid factor of multiple sites without organ or systems involvement: Secondary | ICD-10-CM | POA: Diagnosis not present

## 2020-10-12 DIAGNOSIS — G894 Chronic pain syndrome: Secondary | ICD-10-CM

## 2020-10-12 LAB — RENAL FUNCTION PANEL
Albumin: 3.9 g/dL (ref 3.5–5.2)
BUN: 19 mg/dL (ref 6–23)
CO2: 31 mEq/L (ref 19–32)
Calcium: 10.7 mg/dL — ABNORMAL HIGH (ref 8.4–10.5)
Chloride: 99 mEq/L (ref 96–112)
Creatinine, Ser: 1.17 mg/dL (ref 0.40–1.20)
GFR: 47.22 mL/min — ABNORMAL LOW (ref >60.00)
Glucose, Bld: 87 mg/dL (ref 70–99)
Phosphorus: 2.5 mg/dL (ref 2.3–4.6)
Potassium: 3.8 mEq/L (ref 3.5–5.1)
Sodium: 138 mEq/L (ref 135–145)

## 2020-10-12 NOTE — Assessment & Plan Note (Signed)
Monitors weight daily--up just slightly On torsemide and spironolactone Metolazone if weight stays up

## 2020-10-12 NOTE — Progress Notes (Signed)
Subjective:    Patient ID: Tina Pacheco, female    DOB: 11-21-1950, 70 y.o.   MRN: [id number]  HPI Here for follow up of chronic pain and other medical conditions This visit occurred during the SARS-CoV-2 public health emergency.  Safety protocols were in place, including screening questions prior to the visit, additional usage of staff PPE, and extensive cleaning of exam room while observing appropriate contact time as indicated for disinfecting solutions.   Has had trouble with her foot---the one with past surgery (right) Hoping to avoid need for further surgery Chronic pain is mostly back Takes the oxycodone twice a day  RA under control with cimzia injections On leflunomide also  Is monitoring weight Up a few pounds right now Continues on the diuretics Some AM SOB---will improve if she sits and then walks around No chest pain No palpitations  Current Outpatient Medications on File Prior to Visit  Medication Sig Dispense Refill   amiodarone (PACERONE) 200 MG tablet Take 1 tablet (200 mg total) by mouth daily. Take one tab daily. 30 tablet 11   apixaban (ELIQUIS) 5 MG TABS tablet Take 1 tablet (5 mg total) by mouth 2 (two) times daily. 180 tablet 1   atorvastatin (LIPITOR) 20 MG tablet TAKE 1 TABLET BY MOUTH DAILY AT 6 PM 90 tablet 3   Certolizumab Pegol (CIMZIA [place]) Inject into the skin every 30 (thirty) days.     Crisaborole (EUCRISA) 2 % OINT Apply to aa's BID PRN flares. 60 g 3   gabapentin (NEURONTIN) 300 MG capsule Take 1 capsule (300 mg total) by mouth 4 (four) times daily. 120 capsule 11   ketoconazole (NIZORAL) 2 % cream Apply 1 application topically 2 (two) times daily.     leflunomide (ARAVA) 20 MG tablet Take 20 mg by mouth daily.     meclizine (ANTIVERT) 25 MG tablet TAKE 1 TABLET(25 MG) BY MOUTH THREE TIMES DAILY AS NEEDED FOR DIZZINESS 90 tablet 5   metolazone (ZAROXOLYN) 2.5 MG tablet Take 2.5 mg by mouth daily as needed.     metoprolol succinate (TOPROL-XL)  50 MG 24 hr tablet TAKE 1 TABLET BY MOUTH EVERY DAY WITH OR IMMEDIATELY FOLLOWING A MEAL 90 tablet 3   montelukast (SINGULAIR) 10 MG tablet TAKE 1 TABLET BY MOUTH AT BEDTIME 90 tablet 3   mupirocin ointment (BACTROBAN) 2 % Apply 1 application topically 2 (two) times daily. Qd to burn wounds 30 g 3   nitroGLYCERIN (NITROSTAT) 0.4 MG SL tablet Place 1 tablet (0.4 mg total) under the tongue every 5 (five) minutes as needed for chest pain. 20 tablet 12   nystatin (MYCOSTATIN/NYSTOP) powder Apply 1 application topically 3 (three) times daily as needed.     Oxycodone HCl 20 MG TABS Take 1 tablet (20 mg total) by mouth in the morning and at bedtime. 120 tablet 0   polyethylene glycol (MIRALAX / GLYCOLAX) packet Take 17 g by mouth daily as needed for mild constipation. 14 each 0   Potassium Chloride ER 20 MEQ TBCR Take 20 mEq by mouth in the morning and at bedtime. 180 tablet 3   SF 5000 PLUS 1.1 % CREA dental cream Place 1 application onto teeth 2 (two) times daily.  1   spironolactone (ALDACTONE) 25 MG tablet TAKE 1 TABLET(25 MG) BY MOUTH DAILY 30 tablet 11   terazosin (HYTRIN) 10 MG capsule TAKE 1 CAPSULE(10 MG) BY MOUTH AT BEDTIME 90 capsule 3   torsemide (DEMADEX) 20 MG tablet Take 40 mg  by mouth daily. Takes an extra tablet when feeling SOB or has noticeable edema     No current facility-administered medications on file prior to visit.    Allergies  Allergen Reactions   Fluoxetine     Headache, shaking, sleep issues   Codeine     Nausea and vomiting/only when taking too much    Past Medical History:  Diagnosis Date   Arthritis    RA   Asthma    Basal cell carcinoma    Cataract 2018   bilateral eyes; corrected with surgery   Claustrophobia    Collagen vascular disease ([place])    RA   COPD (chronic obstructive pulmonary disease) ([place])    Diastolic dysfunction    a. echo 07/2014: EF 55-60%, no RWMA, GR2DD, mild MR, LA moderately dilated, PASP 38 mm Hg   Dyslipidemia    Headache     migraines   Hemihypertrophy    History of cardiac cath    a. 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 on Pradaxa; b. CHADSVASc at least 2 (HTN & female)   Rheumatoid arthritis(714.0)    Sleep apnea    a. not compliant with CPAP    Past Surgical History:  Procedure Laterality Date   ABDOMINAL HYSTERECTOMY     APPLICATION OF WOUND VAC Right 08/09/2017   Procedure: APPLICATION OF WOUND VAC;  Surgeon: Albertine Patricia, DPM;  Location: [facility];  Service: Podiatry;  Laterality: Right;   CARDIAC CATHETERIZATION  05/24/2010   nonobstructive CAD   CATARACT EXTRACTION W/ INTRAOCULAR LENS IMPLANT Right 06/12/2016   Dr. Darleen Crocker   CATARACT EXTRACTION W/ INTRAOCULAR LENS IMPLANT Left 06/26/2016   Dr. Darleen Crocker   CESAREAN SECTION     CHOLECYSTECTOMY     COLONOSCOPY  06/12/2011   Procedure: COLONOSCOPY;  Surgeon: Juanita Craver, MD;  Location: [facility];  Service: Endoscopy;  Laterality: N/A;   COLONOSCOPY N/A 03/17/2013   Procedure: COLONOSCOPY;  Surgeon: Juanita Craver, MD;  Location: [facility];  Service: Endoscopy;  Laterality: N/A;   EYE SURGERY     FOOT ARTHRODESIS Right 07/13/2017   Procedure: ARTHRODESIS FOOT-MULTI.FUSIONS (6 JOINTS);  Surgeon: Albertine Patricia, DPM;  Location: [facility];  Service: Podiatry;  Laterality: Right;   IRRIGATION AND DEBRIDEMENT FOOT Right 08/09/2017   Procedure: IRRIGATION AND DEBRIDEMENT FOOT;  Surgeon: Albertine Patricia, DPM;  Location: [facility];  Service: Podiatry;  Laterality: Right;   KNEE ARTHROSCOPY     bilateral   LEFT HEART CATH AND CORONARY ANGIOGRAPHY N/A 06/04/2020   Procedure: LEFT HEART CATH AND CORONARY ANGIOGRAPHY;  Surgeon: Wellington Hampshire, MD;  Location: [facility];  Service: Cardiovascular;  Laterality: N/A;   SINUSOTOMY     TOOTH EXTRACTION  12/2016   VAGINAL  HYSTERECTOMY      Family History  Problem Relation Age of Onset   Emphysema Father        smoked   Arthritis Father    Tuberculosis Mother    Parkinsonism Mother    Cancer Sister    Diabetes type II Sister    Breast cancer Sister    Breast cancer Maternal Aunt    Tuberculosis Sister     Social History   Socioeconomic History   Marital status: Married    Spouse name: Not  on file   Number of children: 1   Years of education: Not on file   Highest education level: Not on file  Occupational History   Occupation: Taxpayer service    Employer: [employer]    Comment: Retired 2007  Tobacco Use   Smoking status: Never   Smokeless tobacco: Never  Vaping Use   Vaping Use: Never used  Substance and Sexual Activity   Alcohol use: No   Drug use: No   Sexual activity: Not Currently  Other Topics Concern   Not on file  Social History Narrative   No living will   Husband, then daughter should be decision maker   Would accept resuscitation attempts   Not sure about tube feeds   Social Determinants of Health   Financial Resource Strain: Not on file  Food Insecurity: No Food Insecurity   Worried About Charity fundraiser in the Last Year: Never true   [place] in the Last Year: Never true  Transportation Needs: No Transportation Needs   Lack of Transportation (Medical): No   Lack of Transportation (Non-Medical): No  Physical Activity: Not on file  Stress: Not on file  Social Connections: Not on file  Intimate Partner Violence: Not on file   Review of Systems Appetite is okay Chronic mood issues with pain and other chronic issues     Objective:   Physical Exam Constitutional:      Appearance: Normal appearance.  Cardiovascular:     Rate and Rhythm: Normal rate and regular rhythm.     Heart sounds: No murmur heard.   No gallop.  Pulmonary:     Effort: Pulmonary effort is normal.     Breath sounds: Normal breath sounds. No wheezing or rales.  Musculoskeletal:      Cervical back: Neck supple.     Comments: Thick calves without pitting  Lymphadenopathy:     Cervical: No cervical adenopathy.  Neurological:     Mental Status: She is alert.           Assessment & Plan:

## 2020-10-12 NOTE — Chronic Care Management (AMB) (Signed)
  Care Management   Note  10/12/2020 Name: Tina Pacheco MRN: [id number] DOB: [birth year]  [name] is a 70 y.o. year old female who is a primary care patient of Venia Carbon, MD and is actively engaged with the care management team. I reached out to [name] by phone today to assist with re-scheduling a follow up visit with the RN Case Manager  Follow up plan: Telephone appointment with care management team member scheduled for: 10/14/2020  Julian Hy, [place], [place] Management  Direct Dial: [phone]

## 2020-10-12 NOTE — Assessment & Plan Note (Signed)
Back and now foot alos Uses the oxycodone bid

## 2020-10-12 NOTE — Assessment & Plan Note (Signed)
In regular rhythm on the amiodarone On eliquis as well

## 2020-10-12 NOTE — Chronic Care Management (AMB) (Signed)
  Care Management   Note  10/12/2020 Name: Tina Pacheco MRN: [id number] DOB: 08/19/1950  Tina Pacheco is a 70 y.o. year old female who is a primary care patient of Venia Carbon, MD and is actively engaged with the care management team. I reached out to [name] by phone today to assist with re-scheduling a follow up visit with the RN Case Manager  Follow up plan:  Pt will call back after she looks at her schedule   Julian Hy, [place], [place] Management  Direct Dial: [phone]

## 2020-10-12 NOTE — Assessment & Plan Note (Signed)
PDMP reviewed No concerns 

## 2020-10-13 ENCOUNTER — Telehealth: Payer: Self-pay | Admitting: Cardiovascular Disease

## 2020-10-13 MED ORDER — APIXABAN 5 MG PO TABS
ORAL_TABLET | ORAL | 1 refills | Status: DC
Start: 2020-10-13 — End: 2020-12-18

## 2020-10-13 NOTE — Telephone Encounter (Signed)
Eliquis mg refill request received. Patient is 70 years old, weight-139.7 kg, Crea- 1.17 on 10/12/20 via epic , Diagnosis-afib, and last seen by Dr. Rockey Situ on 07/30/20. Dose is appropriate based on dosing criteria. Will send in refill to requested pharmacy.

## 2020-10-13 NOTE — Telephone Encounter (Signed)
Refill sent to [facility] as requested.

## 2020-10-13 NOTE — Telephone Encounter (Signed)
Refill request

## 2020-10-13 NOTE — Addendum Note (Signed)
Addended by: Dede Query R on: 10/13/2020 03:17 PM   Modules accepted: Orders

## 2020-10-13 NOTE — Telephone Encounter (Signed)
Patient requesting prescription be sent to [facility]

## 2020-10-14 ENCOUNTER — Ambulatory Visit (INDEPENDENT_AMBULATORY_CARE_PROVIDER_SITE_OTHER): Payer: Medicare Other

## 2020-10-14 DIAGNOSIS — I110 Hypertensive heart disease with heart failure: Secondary | ICD-10-CM | POA: Diagnosis not present

## 2020-10-14 DIAGNOSIS — I480 Paroxysmal atrial fibrillation: Secondary | ICD-10-CM

## 2020-10-14 DIAGNOSIS — I2510 Atherosclerotic heart disease of native coronary artery without angina pectoris: Secondary | ICD-10-CM | POA: Diagnosis not present

## 2020-10-14 DIAGNOSIS — Z9181 History of falling: Secondary | ICD-10-CM

## 2020-10-14 DIAGNOSIS — I5033 Acute on chronic diastolic (congestive) heart failure: Secondary | ICD-10-CM | POA: Diagnosis not present

## 2020-10-14 DIAGNOSIS — I5032 Chronic diastolic (congestive) heart failure: Secondary | ICD-10-CM

## 2020-10-14 DIAGNOSIS — I890 Lymphedema, not elsewhere classified: Secondary | ICD-10-CM

## 2020-10-14 DIAGNOSIS — I4891 Unspecified atrial fibrillation: Secondary | ICD-10-CM | POA: Diagnosis not present

## 2020-10-14 DIAGNOSIS — J9601 Acute respiratory failure with hypoxia: Secondary | ICD-10-CM | POA: Diagnosis not present

## 2020-10-14 NOTE — Patient Instructions (Signed)
Visit Information:  Thank you for taking the time to speak with me today.  PATIENT GOALS:  Goals Addressed             This Visit's Progress    Management of lymphedema and falls   On track    Timeframe:  Long-Range Goal Priority:  High Start Date: 07/06/2020                            Expected End Date:  01/26/2021  Follow up: 11/19/2020  Patient Goals:      - Continue to utilize assistive device (Rollator) appropriately when ambulating. -  Always follow fall prevention recommendations:  Make sure there is good lighting throughout your home Make sure walkways are clear of clutter, cords and throw rugs Make sure furniture is secure, sturdy and does not swivel Grab bars are suggested near the toilet, tub and shower Turn on the lights when you go into a dark area.  Use night- lights Keep items that you use often in easy to reach places.  - Keep follow up appointments with your providers - Continue to elevate legs when sitting or lying down.  - Clean your skin daily. Apply lotion to prevent dry skin.  - Eat a diet rich in fruits and vegetables. - Contact your doctor for increased or new symptoms or concerns.  - Continue to take your medications as prescribed.  - Contact your provider office regarding need for in home personal care services and submit insurance form for completion.  - Schedule follow up appointment with podiatrist if no improvement with foot pain.       Track and Manage Symptoms related to Atrial fibrillation and heart failure   On track    Timeframe:  Long-Range Goal Priority:  High Start Date:  07/06/2020                     Expected End Date: 01/26/2021               Follow Up Date 11/19/2020  Patient Goals: - Continue to check pulse (heart) rate once a day (you can purchase a pulse oximetry at any drug store) - Continue to take your medicine as prescribed and refill timely.  - follow rescue plan if symptoms flare-up (Follow your doctor instructions if you  have shortness of breath and increase swelling) - Call your doctor if you have questions or concerns. - call office if you gain more than 3 pounds overnight or 5 pounds in one week (call doctor for these symptoms) and/ or Atrial fibrillation symptoms ( Continue to follow heart failure action plan given to you by your doctor)  - Continue to do ankle pumps when sitting - Continue to follow  a low salt diet.  - keep legs up while sitting - watch for swelling in feet, ankles and legs every day (report increase to your doctor) - weigh yourself daily and record in log/ diary  Why is this important?   You will be able to handle your symptoms better if you keep track of them.  Making some simple changes to your lifestyle will help.  Eating healthy is one thing you can do to take good care of yourself.          Patient verbalizes understanding of instructions provided today and agrees to view in [place].   The patient has been provided with contact information for the care management  team and has been advised to call with any health related questions or concerns.  The care management team will reach out to the patient again over the next 45 days.   Quinn Plowman RN,BSN,CCM RN Case Manager [place]  [phone]
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
## 2020-10-15 LAB — DRUG MONITORING, PANEL 8 WITH CONFIRMATION, URINE
6 Acetylmorphine: NEGATIVE ng/mL (ref <10)
Alcohol Metabolites: NEGATIVE ng/mL (ref <500)
Amphetamines: NEGATIVE ng/mL (ref <500)
Benzodiazepines: NEGATIVE ng/mL (ref <100)
Buprenorphine, Urine: NEGATIVE ng/mL (ref <5)
Cocaine Metabolite: NEGATIVE ng/mL (ref <150)
Codeine: NEGATIVE ng/mL (ref <50)
Creatinine: 94.2 mg/dL (ref >=20.0)
Hydrocodone: NEGATIVE ng/mL (ref <50)
Hydromorphone: NEGATIVE ng/mL (ref <50)
MDMA: NEGATIVE ng/mL (ref <500)
Marijuana Metabolite: NEGATIVE ng/mL (ref <20)
Morphine: NEGATIVE ng/mL (ref <50)
Norhydrocodone: NEGATIVE ng/mL (ref <50)
Noroxycodone: 2534 ng/mL — ABNORMAL HIGH (ref <50)
Opiates: NEGATIVE ng/mL (ref <100)
Oxidant: NEGATIVE ug/mL (ref <200)
Oxycodone: 1107 ng/mL — ABNORMAL HIGH (ref <50)
Oxycodone: POSITIVE ng/mL — AB (ref <100)
Oxymorphone: 1566 ng/mL — ABNORMAL HIGH (ref <50)
pH: 6.1 (ref 4.5–9.0)

## 2020-10-15 LAB — DM TEMPLATE

## 2020-10-19 ENCOUNTER — Other Ambulatory Visit: Payer: Self-pay

## 2020-10-19 ENCOUNTER — Ambulatory Visit (INDEPENDENT_AMBULATORY_CARE_PROVIDER_SITE_OTHER): Payer: Medicare Other | Admitting: Dermatology

## 2020-10-19 DIAGNOSIS — D1800 Hemangioma unspecified site: Secondary | ICD-10-CM

## 2020-10-19 DIAGNOSIS — Z1283 Encounter for screening for malignant neoplasm of skin: Secondary | ICD-10-CM | POA: Diagnosis not present

## 2020-10-19 DIAGNOSIS — L905 Scar conditions and fibrosis of skin: Secondary | ICD-10-CM | POA: Diagnosis not present

## 2020-10-19 DIAGNOSIS — I2510 Atherosclerotic heart disease of native coronary artery without angina pectoris: Secondary | ICD-10-CM

## 2020-10-19 DIAGNOSIS — D692 Other nonthrombocytopenic purpura: Secondary | ICD-10-CM | POA: Diagnosis not present

## 2020-10-19 DIAGNOSIS — L304 Erythema intertrigo: Secondary | ICD-10-CM | POA: Diagnosis not present

## 2020-10-19 DIAGNOSIS — D229 Melanocytic nevi, unspecified: Secondary | ICD-10-CM | POA: Diagnosis not present

## 2020-10-19 DIAGNOSIS — L578 Other skin changes due to chronic exposure to nonionizing radiation: Secondary | ICD-10-CM

## 2020-10-19 DIAGNOSIS — I890 Lymphedema, not elsewhere classified: Secondary | ICD-10-CM | POA: Diagnosis not present

## 2020-10-19 DIAGNOSIS — J9601 Acute respiratory failure with hypoxia: Secondary | ICD-10-CM | POA: Diagnosis not present

## 2020-10-19 DIAGNOSIS — I5033 Acute on chronic diastolic (congestive) heart failure: Secondary | ICD-10-CM | POA: Diagnosis not present

## 2020-10-19 DIAGNOSIS — B372 Candidiasis of skin and nail: Secondary | ICD-10-CM | POA: Diagnosis not present

## 2020-10-19 DIAGNOSIS — I4891 Unspecified atrial fibrillation: Secondary | ICD-10-CM | POA: Diagnosis not present

## 2020-10-19 DIAGNOSIS — L814 Other melanin hyperpigmentation: Secondary | ICD-10-CM | POA: Diagnosis not present

## 2020-10-19 DIAGNOSIS — L821 Other seborrheic keratosis: Secondary | ICD-10-CM

## 2020-10-19 DIAGNOSIS — I110 Hypertensive heart disease with heart failure: Secondary | ICD-10-CM | POA: Diagnosis not present

## 2020-10-19 MED ORDER — FLUCONAZOLE 200 MG PO TABS: 200.0000 mg | ORAL_TABLET | Freq: Every day | ORAL | 3 refills | Status: DC

## 2020-10-19 NOTE — Patient Instructions (Addendum)
Zeasorb af powder to apply to keep affected areas dry    Melanoma ABCDEs  Melanoma is the most dangerous type of skin cancer, and is the leading cause of death from skin disease.  You are more likely to develop melanoma if you: Have light-colored skin, light-colored eyes, or red or blond hair Spend a lot of time in the sun Tan regularly, either outdoors or in a tanning bed Have had blistering sunburns, especially during childhood Have a close family member who has had a melanoma Have atypical moles or large birthmarks  Early detection of melanoma is key since treatment is typically straightforward and cure rates are extremely high if we catch it early.   The first sign of melanoma is often a change in a mole or a new dark spot.  The ABCDE system is a way of remembering the signs of melanoma.  A for asymmetry:  The two halves do not match. B for border:  The edges of the growth are irregular. C for color:  A mixture of colors are present instead of an even brown color. D for diameter:  Melanomas are usually (but not always) greater than 21m - the size of a pencil eraser. E for evolution:  The spot keeps changing in size, shape, and color.  Please check your skin once per month between visits. You can use a small mirror in front and a large mirror behind you to keep an eye on the back side or your body.   If you see any new or changing lesions before your next follow-up, please call to schedule a visit.  Please continue daily skin protection including broad spectrum sunscreen SPF 30+ to sun-exposed areas, reapplying every 2 hours as needed when you're outdoors.  If you have any questions or concerns for your doctor, please call our main line at [phone] press option 4 to reach your doctor's medical assistant. If no one answers, please leave a voicemail as directed and we will return your call as soon as possible. Messages left after 4 pm will be answered the following business day.    You may also send [place] message via [place] We typically respond to MyChart messages within 1-2 business days.  For prescription refills, please ask your pharmacy to contact our office. Our fax number is [phone]  If you have an urgent issue when the clinic is closed that cannot wait until the next business day, you can page your doctor at the number below.    Please note that while we do our best to be available for urgent issues outside of office hours, we are not available 24/7.   If you have an urgent issue and are unable to reach [place] you may choose to seek medical care at your doctor's office, retail clinic, urgent care center, or emergency room.  If you have a medical emergency, please immediately call 911 or go to the emergency department.  Pager Numbers  - Dr. KNehemiah Massed [phone] - Dr. MLaurence Ferrari [phone] - Dr. SNicole Kindred [phone] In the event of inclement weather, please call our main line at [phone] an update on the status of any delays or closures.  Dermatology Medication Tips: Please keep the boxes that topical medications come in in order to help keep track of the instructions about where and how to use these. Pharmacies typically print the medication instructions only on the boxes and not directly on the medication tubes.   If your medication is too  expensive, please contact our office at [phone] option 4 or send [place] a message through [place].   We are unable to tell what your co-pay for medications will be in advance as this is different depending on your insurance coverage. However, we may be able to find a substitute medication at lower cost or fill out paperwork to get insurance to cover a needed medication.   If a prior authorization is required to get your medication covered by your insurance company, please allow [place] 1-2 business days to complete this process.  Drug prices often vary depending on where the prescription is filled and some  pharmacies may offer cheaper prices.  The website www.goodrx.com contains coupons for medications through different pharmacies. The prices here do not account for what the cost may be with help from insurance (it may be cheaper with your insurance), but the website can give you the price if you did not use any insurance.  - You can print the associated coupon and take it with your prescription to the pharmacy.  - You may also stop by our office during regular business hours and pick up a GoodRx coupon card.  - If you need your prescription sent electronically to a different pharmacy, notify our office through [facility] or by phone at [phone] option 4.
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
## 2020-10-22 DIAGNOSIS — I110 Hypertensive heart disease with heart failure: Secondary | ICD-10-CM | POA: Diagnosis not present

## 2020-10-22 DIAGNOSIS — I4891 Unspecified atrial fibrillation: Secondary | ICD-10-CM | POA: Diagnosis not present

## 2020-10-22 DIAGNOSIS — J9601 Acute respiratory failure with hypoxia: Secondary | ICD-10-CM | POA: Diagnosis not present

## 2020-10-22 DIAGNOSIS — I5033 Acute on chronic diastolic (congestive) heart failure: Secondary | ICD-10-CM | POA: Diagnosis not present

## 2020-10-22 DIAGNOSIS — I2510 Atherosclerotic heart disease of native coronary artery without angina pectoris: Secondary | ICD-10-CM | POA: Diagnosis not present

## 2020-10-22 DIAGNOSIS — I890 Lymphedema, not elsewhere classified: Secondary | ICD-10-CM | POA: Diagnosis not present

## 2020-10-26 DIAGNOSIS — I2510 Atherosclerotic heart disease of native coronary artery without angina pectoris: Secondary | ICD-10-CM | POA: Diagnosis not present

## 2020-10-26 DIAGNOSIS — J9601 Acute respiratory failure with hypoxia: Secondary | ICD-10-CM | POA: Diagnosis not present

## 2020-10-26 DIAGNOSIS — I5033 Acute on chronic diastolic (congestive) heart failure: Secondary | ICD-10-CM | POA: Diagnosis not present

## 2020-10-26 DIAGNOSIS — I110 Hypertensive heart disease with heart failure: Secondary | ICD-10-CM | POA: Diagnosis not present

## 2020-10-26 DIAGNOSIS — I890 Lymphedema, not elsewhere classified: Secondary | ICD-10-CM | POA: Diagnosis not present

## 2020-10-26 DIAGNOSIS — I4891 Unspecified atrial fibrillation: Secondary | ICD-10-CM | POA: Diagnosis not present

## 2020-10-27 ENCOUNTER — Other Ambulatory Visit: Payer: Self-pay | Admitting: Dermatology

## 2020-10-28 ENCOUNTER — Telehealth: Payer: Self-pay | Admitting: Internal Medicine

## 2020-10-28 NOTE — Telephone Encounter (Signed)
  Encourage patient to contact the pharmacy for refills or they can request refills through [place]:  Please schedule appointment if longer than 1 year  NEXT APPOINTMENT DATE:  MEDICATION: oxycodone  Is the patient out of medication? yes  PHARMACY: [facility]- [id number]  [address]  Let patient know to contact pharmacy at the end of the day to make sure medication is ready.  Please notify patient to allow 48-72 hours to process  CLINICAL FILLS OUT ALL BELOW:   LAST REFILL:  QTY:  REFILL DATE:    OTHER COMMENTS:    Okay for refill?  Please advise

## 2020-10-29 ENCOUNTER — Telehealth: Payer: Self-pay | Admitting: Internal Medicine

## 2020-10-29 NOTE — Telephone Encounter (Signed)
Mrs. Perot called in wanted to know about getting her refill

## 2020-10-31 MED ORDER — OXYCODONE HCL 20 MG PO TABS: 1.0000 | ORAL_TABLET | Freq: Two times a day (BID) | ORAL | 0 refills | Status: DC

## 2020-10-31 NOTE — Telephone Encounter (Signed)
Rx filled

## 2020-11-02 NOTE — Progress Notes (Deleted)
Cardiology Office Note:    Date:  11/02/2020   ID:  DIANN DEBES, DOB 02/08/1951, MRN [id number]  PCP:  Venia Carbon, MD  [facility] Cardiologist:  Ida Rogue, MD  [place] Electrophysiologist:  None   Referring MD: Venia Carbon, MD   Chief Complaint: 3 month follow-up  History of Present Illness:    Tina Pacheco is a 70 y.o. 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     Patient underwent cardiac cath in 2012 which showed nonobstructive CAD.  She was diagnosed with A. fib in 2012 and has been anticoagulated with DOAC.  Echo in 2016 showed EF of 55 to 60%, no wall motion abnormality, grade 2 diastolic dysfunction, mild MR.  She was admitted in January 2021 with weakness and multiple falls and acute heart failure.  Echo showed EF of 55 to 60%, mild LVH, grade 1 diastolic dysfunction, no wall motion abnormality, mild to moderate dilated left atrium, moderate mitral valve calcification without regurgitation or stenosis.  She was admitted November 2021 for volume overload and weight gain.  She was IV diuresed.  Echo showed EF 60 to 65%.   Patient was admitted 05/28/20 with hypoxic respiratory failure due to pulmonary edema in the setting of acute on chronic diastolic CHF and afib RVR.  He also reported chest pain. She had abnormal stress test and underwent cardiac cath showing stable nonobstructive CAD. She was started on amiodarone.  Eliquis was continued.  Aspirin was discontinued due to risk of bleeding.  She was discharged on torsemide 40 mg daily.  Weight on 06/08/2020 discharge day was 326 pounds.  Seen 06/29/20 and weight was up. It was recommended she take metolazone 2.'5mg'$  twice a week. In follow-up 07/30/20 felt swelling mostly from lymphedema. Had been referred to vein and vascular for consideration of lymphedema pumps.   Today,    Past Medical History:  Diagnosis Date    Arthritis    RA   Asthma    Basal cell carcinoma    Cataract 2018   bilateral eyes; corrected with surgery   Claustrophobia    Collagen vascular disease ([place])    RA   COPD (chronic obstructive pulmonary disease) (HCC)    Diastolic dysfunction    a. echo 07/2014: EF 55-60%, no RWMA, GR2DD, mild MR, LA moderately dilated, PASP 38 mm Hg   Dyslipidemia    Headache    migraines   Hemihypertrophy    History of cardiac cath    a. cardiac cath 05/24/2010 - nonobstructive CAD   History of gout    Hyperplastic colonic polyp 2003   Hypertension    Hypokalemia    Morbid obesity ([place])    PAF (paroxysmal atrial fibrillation) ([place])    a. on Pradaxa; b. CHADSVASc at least 2 (HTN & female)   Rheumatoid arthritis(714.0)    Sleep apnea    a. not compliant with CPAP    Past Surgical History:  Procedure Laterality Date   ABDOMINAL HYSTERECTOMY     APPLICATION OF WOUND VAC Right 08/09/2017   Procedure: APPLICATION OF WOUND VAC;  Surgeon: Albertine Patricia, DPM;  Location: [facility] ORS;  Service: Podiatry;  Laterality: Right;   CARDIAC CATHETERIZATION  05/24/2010   nonobstructive CAD   CATARACT EXTRACTION W/ INTRAOCULAR LENS IMPLANT Right 06/12/2016   Dr. Darleen Crocker   CATARACT EXTRACTION W/ INTRAOCULAR LENS IMPLANT Left 06/26/2016   Dr. Christia Reading  [place]   CESAREAN SECTION     CHOLECYSTECTOMY     COLONOSCOPY  06/12/2011   Procedure: COLONOSCOPY;  Surgeon: Juanita Craver, MD;  Location: [facility];  Service: Endoscopy;  Laterality: N/A;   COLONOSCOPY N/A 03/17/2013   Procedure: COLONOSCOPY;  Surgeon: Juanita Craver, MD;  Location: [facility];  Service: Endoscopy;  Laterality: N/A;   EYE SURGERY     FOOT ARTHRODESIS Right 07/13/2017   Procedure: ARTHRODESIS FOOT-MULTI.FUSIONS (6 JOINTS);  Surgeon: Albertine Patricia, DPM;  Location: [facility] OR[facility];  Service: Podiatry;  Laterality: Right;   IRRIGATION AND DEBRIDEMENT FOOT Right 08/09/2017   Procedure: IRRIGATION AND DEBRIDEMENT FOOT;  Surgeon: Albertine Patricia,  DPM;  Location: [facility];  Service: Podiatry;  Laterality: Right;   KNEE ARTHROSCOPY     bilateral   LEFT HEART CATH AND CORONARY ANGIOGRAPHY N/A 06/04/2020   Procedure: LEFT HEART CATH AND CORONARY ANGIOGRAPHY;  Surgeon: Wellington Hampshire, MD;  Location: [place] [facility];  Service: Cardiovascular;  Laterality: N/A;   SINUSOTOMY     TOOTH EXTRACTION  12/2016   VAGINAL HYSTERECTOMY      Current Medications: No outpatient medications have been marked as taking for the 11/03/20 encounter (Appointment) with Kathlen Mody, Catori Panozzo H, PA-C.     Allergies:   Fluoxetine and Codeine   Social History   Socioeconomic History   Marital status: Married    Spouse name: Not on file   Number of children: 1   Years of education: Not on file   Highest education level: Not on file  Occupational History   Occupation: Frank service    Employer: [employer]    Comment: Retired 2007  Tobacco Use   Smoking status: Never   Smokeless tobacco: Never  Vaping Use   Vaping Use: Never used  Substance and Sexual Activity   Alcohol use: No   Drug use: No   Sexual activity: Not Currently  Other Topics Concern   Not on file  Social History Narrative   No living will   Husband, then daughter should be decision maker   Would accept resuscitation attempts   Not sure about tube feeds   Social Determinants of Health   Financial Resource Strain: Not on file  Food Insecurity: No Food Insecurity   Worried About Charity fundraiser in the Last Year: Never true   [place] in the Last Year: Never true  Transportation Needs: No Transportation Needs   Lack of Transportation (Medical): No   Lack of Transportation (Non-Medical): No  Physical Activity: Not on file  Stress: Not on file  Social Connections: Not on file     Family History: The patient's family history includes Arthritis in her father; Breast cancer in her maternal aunt and sister; Cancer in her sister; Diabetes type II in her sister; Emphysema in  her father; Parkinsonism in her mother; Tuberculosis in her mother and sister.  ROS:   Please see the history of present illness.     All other systems reviewed and are negative.  EKGs/Labs/Other Studies Reviewed:    The following studies were reviewed today:  Cardiac cath 06/04/20 Dist LAD lesion is 30% stenosed. Ost LAD to Mid LAD lesion is 40% stenosed.   1.  No evidence of obstructive coronary artery disease.  Relatively stable mild to moderate proximal LAD disease. 2.  Left ventricular angiography was not performed.  EF is normal by echo. 3.  Mildly elevated left ventricular end-diastolic pressure.   Recommendations: Continue medical therapy. Eliquis  can be resumed tomorrow morning.  Echo 05/29/20  1. Left ventricular ejection fraction, by estimation, is 60 to 65%. The  left ventricle has normal function. The left ventricle has no regional  wall motion abnormalities. There is mild left ventricular hypertrophy.  Left ventricular diastolic parameters  are consistent with Grade I diastolic dysfunction (impaired relaxation).   2. Right ventricular systolic function is normal. The right ventricular  size is normal. Tricuspid regurgitation signal is inadequate for assessing  PA pressure.   3. Left atrial size was moderately dilated.      EKG:  EKG is *** ordered today.  The ekg ordered today demonstrates ***  Recent Labs: 06/02/2020: Magnesium 1.9 06/07/2020: Hemoglobin 12.3; Platelets 187 06/23/2020: B Natriuretic Peptide 89.0 06/29/2020: ALT 17 10/12/2020: BUN 19; Creatinine, Ser 1.17; Potassium 3.8; Sodium 138  Recent Lipid Panel    Component Value Date/Time   CHOL 113 08/27/2019 1247   TRIG 70.0 08/27/2019 1247   HDL 43.70 08/27/2019 1247   CHOLHDL 3 08/27/2019 1247   VLDL 14.0 08/27/2019 1247   LDLCALC 56 08/27/2019 1247     Risk Assessment/Calculations:   {Does this patient have ATRIAL FIBRILLATION?:[id number]}   Physical Exam:    VS:  There were no vitals taken  for this visit.    Wt Readings from Last 3 Encounters:  10/12/20 (!) 308 lb (139.7 kg)  07/30/20 (!) 301 lb 4 oz (136.6 kg)  07/13/20 (!) 323 lb 6.4 oz (146.7 kg)     GEN: *** Well nourished, well developed in no acute distress HEENT: Normal NECK: No JVD; No carotid bruits LYMPHATICS: No lymphadenopathy CARDIAC: ***RRR, no murmurs, rubs, gallops RESPIRATORY:  Clear to auscultation without rales, wheezing or rhonchi  ABDOMEN: Soft, non-tender, non-distended MUSCULOSKELETAL:  No edema; No deformity  SKIN: Warm and dry NEUROLOGIC:  Alert and oriented x 3 PSYCHIATRIC:  Normal affect   ASSESSMENT:    No diagnosis found. PLAN:    In order of problems listed above:  Paroxysmal Afib  Nonobstructive CAD  HFpEF  HTN  HLD  Obesity  Disposition: Follow up {follow up:15908} with ***   Shared Decision Making/Informed Consent   {Are you ordering a CV Procedure (e.g. stress test, cath, DCCV, TEE, etc)?   Press F2        :[id number]    Signed, Kamoni Depree Ninfa Meeker, PA-C  11/02/2020 12:47 PM    [facility]

## 2020-11-02 NOTE — Telephone Encounter (Signed)
error 

## 2020-11-03 ENCOUNTER — Ambulatory Visit: Payer: Medicare Other | Admitting: Medical

## 2020-11-04 ENCOUNTER — Telehealth: Payer: Self-pay | Admitting: Internal Medicine

## 2020-11-04 NOTE — Telephone Encounter (Signed)
Spoke to pt. Said she had an episode of increased pain in the right shoulder the last week. It has gotten better now. Wanted Dr Silvio Pate know.

## 2020-11-04 NOTE — Telephone Encounter (Signed)
Patient called in would like to a call back regarding her shoulder pain [phone]

## 2020-11-04 NOTE — Telephone Encounter (Signed)
I am glad to hear that she is better. No action now

## 2020-11-09 ENCOUNTER — Telehealth: Payer: Self-pay | Admitting: *Deleted

## 2020-11-09 NOTE — Telephone Encounter (Signed)
I will have to review with her whether this was CBD or THC. If it was CBD, perhaps a lower dose might help her chronic pain without the CNS problems

## 2020-11-09 NOTE — Telephone Encounter (Signed)
PLEASE NOTE: All timestamps contained within this report are represented as [place] Standard Time. CONFIDENTIALTY NOTICE: This fax transmission is intended only for the addressee. It contains information that is legally privileged, confidential or otherwise protected from use or disclosure. If you are not the intended recipient, you are strictly prohibited from reviewing, disclosing, copying using or disseminating any of this information or taking any action in reliance on or regarding this information. If you have received this fax in error, please notify us immediately by telephone so that we can arrange for its return to [place]. Phone: [phone], Toll-Free: [phone], Fax: [phone] Page: 1 of 2 Call Id: [id number] [place] [facility] - Client TELEPHONE ADVICE RECORD [facility] Patient Name: Tina Pacheco Gender: Female DOB: 08-31-1950 Age: 70 Y 9 M 5 D Return Phone Number: [phone] (Primary), [phone] (Secondary) Address: [place]  [address] Client  [facility] - Client Client Site [place] - Day Physician [name] Contact Type Call Who Is Calling Patient / Member / Family / Caregiver Call Type Triage / Clinical Caller Name [name] Relationship To Patient Daughter Return Phone Number [phone] (Secondary) Chief Complaint CONFUSION - new onset Reason for Call Symptomatic / Request for Health Information Initial Comment Caller's mom is having severe shoulder pain and new onset cognitive issues. The office is transferring due to no appt. She is not able to form sentences, and got confused walking in her small house. Confusion happens when the shoulder pain is the issue. She is on oxy and she is not sure is taking it as prescribed. Translation No Nurse Assessment Nurse: Nyoka Cowden, RN, [name] Hawks Date/Time (Eastern Time): 11/09/2020 10:56:22 AM Confirm and  document reason for call. If symptomatic, describe symptoms. ---Caller states the patient is having severe shoulder pain for the last 6 weeks, caller believes she injured it but she is unsure how. She is also having cognitive issues, got lost finding her way out of the kitchen. It has been going on for 6 weeks. Does the patient have any new or worsening symptoms? ---Yes Will a triage be completed? ---Yes Related visit to physician within the last 2 weeks? ---No Does the PT have any chronic conditions? (i.e. diabetes, asthma, this includes High risk factors for pregnancy, etc.) ---Yes List chronic conditions. ---arthritis, chf, a-fib, hypertension Is this a behavioral health or substance abuse call? ---No Guidelines Guideline Title Affirmed Question Affirmed Notes Nurse Date/Time [name] Confusion - Delirium [1] Acting confused (e.g., disoriented, Nyoka Cowden, [place], [place] 11/09/2020 11:01:33 AM PLEASE NOTE: All timestamps contained within this report are represented as [place] Standard Time. CONFIDENTIALTY NOTICE: This fax transmission is intended only for the addressee. It contains information that is legally privileged, confidential or otherwise protected from use or disclosure. If you are not the intended recipient, you are strictly prohibited from reviewing, disclosing, copying using or disseminating any of this information or taking any action in reliance on or regarding this information. If you have received this fax in error, please notify us immediately by telephone so that we can arrange for its return to [place]. Phone: [phone], Toll-Free: [phone], Fax: [phone] Page: 2 of 2 Call Id: [id number] Guidelines Guideline Title Affirmed Question Affirmed Notes Nurse Date/Time [name] slurred speech) AND [2] brief (now gone) Disp. Time [name] Disposition Final User 11/09/2020 10:53:25 AM Send to Urgent Ashok Pall 11/09/2020 11:09:45 AM Go to ED  Now (or PCP triage) Yes Nyoka Cowden, RN, [place] Disposition  Overriden: See HCP within 4 Hours (or PCP triage) Override Reason: Patient's symptoms need a higher level of care Caller Disagree/Comply Comply Caller Understands Yes PreDisposition Call Doctor Care Advice Given Per Guideline GO TO ED NOW (OR PCP TRIAGE): * IF NO PCP (PRIMARY CARE PROVIDER) SECOND-LEVEL TRIAGE: You need to be seen within the next hour. Go to the [facility] at _____________ [place] as soon as you can. ANOTHER ADULT SHOULD DRIVE: CARE ADVICE given per Confusion-Delirium (Adult) guideline. Comments User: Shanon Rosser, RN Date/Time [name]: 11/09/2020 11:11:52 AM patient upgraded do to severity of symptoms, confusion, on blood thinners, daughter not [id number] certain that if she had an injury 6 weeks ago that she didn't hit her head. As well as the severe shoulder pain she has been experiencing over the last 6 weeks. Referrals [facility] - ED

## 2020-11-09 NOTE — Telephone Encounter (Signed)
Spoke to patient's daughter [name] and was advised that she has figured out what is going on with her mom. [name] stated that her mom is high. [name] stated that her mom has been taking THC Hemp gummies and is probably taking more than she should. [name] stated that her mom is also drinking Hemp tea. [name] stated that she figured this out because her mom told her that she is feeling really happy and carefree. [name] stated that she is going to take the Hemp items from her mom to make sure that she is not taking them anymore. [name] stated that she has scheduled her mom an appointment with her orthopedist on Monday for her shoulder pain.  [name] was given ER precautions and she verbalized understanding.

## 2020-11-10 ENCOUNTER — Inpatient Hospital Stay
Admission: EM | Admit: 2020-11-10 | Discharge: 2020-11-18 | DRG: 483 | Disposition: A | Discharge status: Skilled Nursing Facility | Payer: Medicare Other | Attending: Internal Medicine | Admitting: Internal Medicine

## 2020-11-10 ENCOUNTER — Emergency Department: Payer: Medicare Other

## 2020-11-10 ENCOUNTER — Other Ambulatory Visit: Payer: Self-pay

## 2020-11-10 DIAGNOSIS — Z831 Family history of other infectious and parasitic diseases: Secondary | ICD-10-CM

## 2020-11-10 DIAGNOSIS — Z419 Encounter for procedure for purposes other than remedying health state, unspecified: Secondary | ICD-10-CM

## 2020-11-10 DIAGNOSIS — M109 Gout, unspecified: Secondary | ICD-10-CM | POA: Diagnosis present

## 2020-11-10 DIAGNOSIS — I130 Hypertensive heart and chronic kidney disease with heart failure and stage 1 through stage 4 chronic kidney disease, or unspecified chronic kidney disease: Secondary | ICD-10-CM | POA: Diagnosis present

## 2020-11-10 DIAGNOSIS — J189 Pneumonia, unspecified organism: Secondary | ICD-10-CM | POA: Diagnosis not present

## 2020-11-10 DIAGNOSIS — I480 Paroxysmal atrial fibrillation: Secondary | ICD-10-CM | POA: Diagnosis present

## 2020-11-10 DIAGNOSIS — M069 Rheumatoid arthritis, unspecified: Secondary | ICD-10-CM | POA: Diagnosis present

## 2020-11-10 DIAGNOSIS — Z833 Family history of diabetes mellitus: Secondary | ICD-10-CM

## 2020-11-10 DIAGNOSIS — I2510 Atherosclerotic heart disease of native coronary artery without angina pectoris: Secondary | ICD-10-CM | POA: Diagnosis present

## 2020-11-10 DIAGNOSIS — R918 Other nonspecific abnormal finding of lung field: Secondary | ICD-10-CM | POA: Diagnosis not present

## 2020-11-10 DIAGNOSIS — Z6841 Body Mass Index (BMI) 40.0 and over, adult: Secondary | ICD-10-CM

## 2020-11-10 DIAGNOSIS — Z885 Allergy status to narcotic agent status: Secondary | ICD-10-CM

## 2020-11-10 DIAGNOSIS — S43004A Unspecified dislocation of right shoulder joint, initial encounter: Secondary | ICD-10-CM

## 2020-11-10 DIAGNOSIS — M24411 Recurrent dislocation, right shoulder: Principal | ICD-10-CM | POA: Diagnosis present

## 2020-11-10 DIAGNOSIS — Z96611 Presence of right artificial shoulder joint: Secondary | ICD-10-CM

## 2020-11-10 DIAGNOSIS — Z85828 Personal history of other malignant neoplasm of skin: Secondary | ICD-10-CM

## 2020-11-10 DIAGNOSIS — Z8719 Personal history of other diseases of the digestive system: Secondary | ICD-10-CM

## 2020-11-10 DIAGNOSIS — I10 Essential (primary) hypertension: Secondary | ICD-10-CM | POA: Diagnosis present

## 2020-11-10 DIAGNOSIS — Z23 Encounter for immunization: Secondary | ICD-10-CM | POA: Diagnosis not present

## 2020-11-10 DIAGNOSIS — M353 Polymyalgia rheumatica: Secondary | ICD-10-CM | POA: Diagnosis present

## 2020-11-10 DIAGNOSIS — R059 Cough, unspecified: Secondary | ICD-10-CM

## 2020-11-10 DIAGNOSIS — M25311 Other instability, right shoulder: Secondary | ICD-10-CM | POA: Diagnosis present

## 2020-11-10 DIAGNOSIS — X500XXA Overexertion from strenuous movement or load, initial encounter: Secondary | ICD-10-CM

## 2020-11-10 DIAGNOSIS — L89152 Pressure ulcer of sacral region, stage 2: Secondary | ICD-10-CM | POA: Diagnosis present

## 2020-11-10 DIAGNOSIS — M75101 Unspecified rotator cuff tear or rupture of right shoulder, not specified as traumatic: Secondary | ICD-10-CM | POA: Diagnosis present

## 2020-11-10 DIAGNOSIS — I5032 Chronic diastolic (congestive) heart failure: Secondary | ICD-10-CM | POA: Diagnosis present

## 2020-11-10 DIAGNOSIS — Z888 Allergy status to other drugs, medicaments and biological substances status: Secondary | ICD-10-CM

## 2020-11-10 DIAGNOSIS — G4733 Obstructive sleep apnea (adult) (pediatric): Secondary | ICD-10-CM | POA: Diagnosis present

## 2020-11-10 DIAGNOSIS — Z20822 Contact with and (suspected) exposure to covid-19: Secondary | ICD-10-CM | POA: Diagnosis not present

## 2020-11-10 DIAGNOSIS — Z820 Family history of epilepsy and other diseases of the nervous system: Secondary | ICD-10-CM

## 2020-11-10 DIAGNOSIS — Z9119 Patient's noncompliance with other medical treatment and regimen: Secondary | ICD-10-CM

## 2020-11-10 DIAGNOSIS — Z90710 Acquired absence of both cervix and uterus: Secondary | ICD-10-CM

## 2020-11-10 DIAGNOSIS — M7551 Bursitis of right shoulder: Secondary | ICD-10-CM | POA: Diagnosis present

## 2020-11-10 DIAGNOSIS — N1831 Chronic kidney disease, stage 3a: Secondary | ICD-10-CM | POA: Diagnosis present

## 2020-11-10 DIAGNOSIS — Z7901 Long term (current) use of anticoagulants: Secondary | ICD-10-CM

## 2020-11-10 DIAGNOSIS — S42201A Unspecified fracture of upper end of right humerus, initial encounter for closed fracture: Secondary | ICD-10-CM | POA: Diagnosis not present

## 2020-11-10 DIAGNOSIS — Z803 Family history of malignant neoplasm of breast: Secondary | ICD-10-CM

## 2020-11-10 DIAGNOSIS — Z8261 Family history of arthritis: Secondary | ICD-10-CM

## 2020-11-10 DIAGNOSIS — E785 Hyperlipidemia, unspecified: Secondary | ICD-10-CM | POA: Diagnosis present

## 2020-11-10 DIAGNOSIS — S43014A Anterior dislocation of right humerus, initial encounter: Secondary | ICD-10-CM | POA: Diagnosis not present

## 2020-11-10 DIAGNOSIS — Z825 Family history of asthma and other chronic lower respiratory diseases: Secondary | ICD-10-CM

## 2020-11-10 DIAGNOSIS — S42291A Other displaced fracture of upper end of right humerus, initial encounter for closed fracture: Secondary | ICD-10-CM | POA: Diagnosis present

## 2020-11-10 DIAGNOSIS — S43005A Unspecified dislocation of left shoulder joint, initial encounter: Secondary | ICD-10-CM

## 2020-11-10 DIAGNOSIS — S43006A Unspecified dislocation of unspecified shoulder joint, initial encounter: Secondary | ICD-10-CM | POA: Insufficient documentation

## 2020-11-10 DIAGNOSIS — Z9181 History of falling: Secondary | ICD-10-CM

## 2020-11-10 DIAGNOSIS — Z79899 Other long term (current) drug therapy: Secondary | ICD-10-CM

## 2020-11-10 DIAGNOSIS — S43011A Anterior subluxation of right humerus, initial encounter: Secondary | ICD-10-CM | POA: Diagnosis not present

## 2020-11-10 DIAGNOSIS — J449 Chronic obstructive pulmonary disease, unspecified: Secondary | ICD-10-CM | POA: Diagnosis present

## 2020-11-10 DIAGNOSIS — G4730 Sleep apnea, unspecified: Secondary | ICD-10-CM | POA: Diagnosis present

## 2020-11-10 LAB — CBC WITH DIFFERENTIAL/PLATELET
Abs Immature Granulocytes: 0.02 10*3/uL (ref 0.00–0.07)
Basophils Absolute: 0.1 10*3/uL (ref 0.0–0.1)
Basophils Relative: 1 %
Eosinophils Absolute: 0.3 10*3/uL (ref 0.0–0.5)
Eosinophils Relative: 4 %
HCT: 39.4 % (ref 36.0–46.0)
Hemoglobin: 12.7 g/dL (ref 12.0–15.0)
Immature Granulocytes: 0 %
Lymphocytes Relative: 27 %
Lymphs Abs: 1.9 10*3/uL (ref 0.7–4.0)
MCH: 30.4 pg (ref 26.0–34.0)
MCHC: 32.2 g/dL (ref 30.0–36.0)
MCV: 94.3 fL (ref 80.0–100.0)
Monocytes Absolute: 0.5 10*3/uL (ref 0.1–1.0)
Monocytes Relative: 7 %
Neutro Abs: 4.2 10*3/uL (ref 1.7–7.7)
Neutrophils Relative %: 61 %
Platelets: 165 10*3/uL (ref 150–400)
RBC: 4.18 MIL/uL (ref 3.87–5.11)
RDW: 16.5 % — ABNORMAL HIGH (ref 11.5–15.5)
WBC: 7 10*3/uL (ref 4.0–10.5)
nRBC: 0 % (ref 0.0–0.2)

## 2020-11-10 LAB — BASIC METABOLIC PANEL
Anion gap: 10 (ref 5–15)
BUN: 28 mg/dL — ABNORMAL HIGH (ref 8–23)
CO2: 25 mmol/L (ref 22–32)
Calcium: 10.2 mg/dL (ref 8.9–10.3)
Chloride: 103 mmol/L (ref 98–111)
Creatinine, Ser: 1.19 mg/dL — ABNORMAL HIGH (ref 0.44–1.00)
GFR, Estimated: 49 mL/min — ABNORMAL LOW (ref >60)
Glucose, Bld: 88 mg/dL (ref 70–99)
Potassium: 3.9 mmol/L (ref 3.5–5.1)
Sodium: 138 mmol/L (ref 135–145)

## 2020-11-10 LAB — RESP PANEL BY RT-PCR (FLU A&B, COVID) ARPGX2
Influenza A by PCR: NEGATIVE
Influenza B by PCR: NEGATIVE
SARS Coronavirus 2 by RT PCR: NEGATIVE

## 2020-11-10 MED ORDER — MIDAZOLAM HCL 2 MG/2ML IJ SOLN
INTRAMUSCULAR | Status: AC | PRN
  Administered 2020-11-10: 2 mg via INTRAVENOUS
  Administered 2020-11-10: 4 mg via INTRAVENOUS

## 2020-11-10 MED ORDER — HYDROCODONE-ACETAMINOPHEN 5-325 MG PO TABS
1.0000 | ORAL_TABLET | ORAL | Status: DC | PRN
  Administered 2020-11-11 – 2020-11-17 (×19): 2 via ORAL
  Filled 2020-11-10 (×19): qty 2

## 2020-11-10 MED ORDER — ACETAMINOPHEN 650 MG RE SUPP: 650.0000 mg | Freq: Four times a day (QID) | RECTAL | Status: DC | PRN

## 2020-11-10 MED ORDER — ONDANSETRON HCL 4 MG/2ML IJ SOLN
4.0000 mg | Freq: Four times a day (QID) | INTRAMUSCULAR | Status: DC | PRN
  Administered 2020-11-11: 4 mg via INTRAVENOUS
  Filled 2020-11-10: qty 2

## 2020-11-10 MED ORDER — APIXABAN 5 MG PO TABS: 5.0000 mg | ORAL_TABLET | Freq: Two times a day (BID) | ORAL | Status: DC

## 2020-11-10 MED ORDER — CEFAZOLIN SODIUM-DEXTROSE 2-4 GM/100ML-% IV SOLN: 2.0000 g | INTRAVENOUS | Status: DC

## 2020-11-10 MED ORDER — MIDAZOLAM HCL 2 MG/2ML IJ SOLN
4.0000 mg | Freq: Once | INTRAMUSCULAR | Status: DC
  Filled 2020-11-10: qty 4

## 2020-11-10 MED ORDER — ETOMIDATE 2 MG/ML IV SOLN: 20.0000 mg | Freq: Once | INTRAVENOUS | Status: DC

## 2020-11-10 MED ORDER — ACETAMINOPHEN 325 MG PO TABS: 650.0000 mg | ORAL_TABLET | Freq: Four times a day (QID) | ORAL | Status: DC | PRN

## 2020-11-10 MED ORDER — MIDAZOLAM HCL (PF) 10 MG/2ML IJ SOLN: 10.0000 mg | Freq: Once | INTRAMUSCULAR | Status: DC

## 2020-11-10 MED ORDER — FENTANYL CITRATE (PF) 100 MCG/2ML IJ SOLN
INTRAMUSCULAR | Status: AC | PRN
  Administered 2020-11-10: 100 ug via INTRAVENOUS
  Administered 2020-11-10: 50 ug via INTRAVENOUS

## 2020-11-10 MED ORDER — FENTANYL CITRATE PF 50 MCG/ML IJ SOSY
100.0000 ug | PREFILLED_SYRINGE | Freq: Once | INTRAMUSCULAR | Status: DC
  Filled 2020-11-10: qty 2

## 2020-11-10 MED ORDER — FENTANYL CITRATE PF 50 MCG/ML IJ SOSY: 50.0000 ug | PREFILLED_SYRINGE | Freq: Once | INTRAMUSCULAR | Status: DC

## 2020-11-10 MED ORDER — ONDANSETRON HCL 4 MG PO TABS: 4.0000 mg | ORAL_TABLET | Freq: Four times a day (QID) | ORAL | Status: DC | PRN

## 2020-11-10 MED ORDER — MIDAZOLAM HCL 2 MG/2ML IJ SOLN
INTRAMUSCULAR | Status: AC
  Filled 2020-11-10: qty 2

## 2020-11-10 MED ORDER — MIDAZOLAM HCL 2 MG/2ML IJ SOLN: 2.0000 mg | Freq: Once | INTRAMUSCULAR | Status: DC

## 2020-11-10 MED ORDER — MORPHINE SULFATE (PF) 2 MG/ML IV SOLN
2.0000 mg | INTRAVENOUS | Status: DC | PRN
  Administered 2020-11-10 – 2020-11-16 (×4): 2 mg via INTRAVENOUS
  Filled 2020-11-10 (×4): qty 1

## 2020-11-10 MED ORDER — FENTANYL CITRATE PF 50 MCG/ML IJ SOSY
PREFILLED_SYRINGE | INTRAMUSCULAR | Status: AC
  Filled 2020-11-10: qty 1

## 2020-11-10 NOTE — H&P (Signed)
History and Physical    Tina Pacheco [id number] DOB: 04/21/1950 DOA: 11/10/2020  PCP: Venia Carbon, MD   Patient coming from: home  I have personally briefly reviewed patient's old medical records in [place]  Chief Complaint: right shoulder dislocation  HPI: Tina Pacheco is a 70 y.o. female with medical history significant for Rheumatoid arthritis on Arava, CAD, paroxysmal A. fib on Eliquis, chronic diastolic heart failure, HTN, COPD, sleep apnea, morbid obesity with BMI of 50 who presents to the ED on referral from [facility], for management of right shoulder dislocation.  Patient was apparently in her usual state of health when 2 weeks prior she raised her right arm and had sudden onset of pain in the right shoulder.  In spite of her pain she was able to continue with her usual activities however over the past several days the pain has become acutely worse and she has difficulty using her walker to ambulate thus prompting her visit to the urgent care whereupon she was sent to the ED.  Patient denies a fall or trauma to the area.  She denied numbness or tingling in the arm.  She is otherwise in her usual state of health, and denies any chest pain, shortness of breath, cough, fever or chills.  Has no nausea or vomiting, no abdominal pain, change in bowel habits.  Daughter at bedside contributes to history  ED course: On arrival, BP 145/76, pulse 51 with O2 sat 96% on room air Blood work pending (currently being drawn)  Imaging: Right anterior shoulder dislocation on x-ray.  CT right shoulder pending  Patient had unsuccessful attempted reduction with procedural sedation with midazolam and fentanyl in the ED. Orthopedist Dr. Roland Rack consulted who will take patient to the OR in the a.m. for shoulder reduction under anesthesia Hospitalist consulted for admission  Review of Systems: As per HPI otherwise all other systems on review of systems negative.    Past Medical  History:  Diagnosis Date   Arthritis    RA   Asthma    Basal cell carcinoma    Cataract 2018   bilateral eyes; corrected with surgery   Claustrophobia    Collagen vascular disease ([place])    RA   COPD (chronic obstructive pulmonary disease) (HCC)    Diastolic dysfunction    a. echo 07/2014: EF 55-60%, no RWMA, GR2DD, mild MR, LA moderately dilated, PASP 38 mm Hg   Dyslipidemia    Headache    migraines   Hemihypertrophy    History of cardiac cath    a. cardiac cath 05/24/2010 - nonobstructive CAD   History of gout    Hyperplastic colonic polyp 2003   Hypertension    Hypokalemia    Morbid obesity ([place])    PAF (paroxysmal atrial fibrillation) ([place])    a. on Pradaxa; b. CHADSVASc at least 2 (HTN & female)   Rheumatoid arthritis(714.0)    Sleep apnea    a. not compliant with CPAP    Past Surgical History:  Procedure Laterality Date   ABDOMINAL HYSTERECTOMY     APPLICATION OF WOUND VAC Right 08/09/2017   Procedure: APPLICATION OF WOUND VAC;  Surgeon: Albertine Patricia, DPM;  Location: [facility] ORS;  Service: Podiatry;  Laterality: Right;   CARDIAC CATHETERIZATION  05/24/2010   nonobstructive CAD   CATARACT EXTRACTION W/ INTRAOCULAR LENS IMPLANT Right 06/12/2016   Dr. Darleen Crocker   CATARACT EXTRACTION W/ INTRAOCULAR LENS IMPLANT Left 06/26/2016   Dr. Darleen Crocker   CESAREAN  SECTION     CHOLECYSTECTOMY     COLONOSCOPY  06/12/2011   Procedure: COLONOSCOPY;  Surgeon: Juanita Craver, MD;  Location: [facility];  Service: Endoscopy;  Laterality: N/A;   COLONOSCOPY N/A 03/17/2013   Procedure: COLONOSCOPY;  Surgeon: Juanita Craver, MD;  Location: [facility];  Service: Endoscopy;  Laterality: N/A;   EYE SURGERY     FOOT ARTHRODESIS Right 07/13/2017   Procedure: ARTHRODESIS FOOT-MULTI.FUSIONS (6 JOINTS);  Surgeon: Albertine Patricia, DPM;  Location: [facility];  Service: Podiatry;  Laterality: Right;   IRRIGATION AND DEBRIDEMENT FOOT Right 08/09/2017   Procedure: IRRIGATION AND DEBRIDEMENT FOOT;   Surgeon: Albertine Patricia, DPM;  Location: [facility];  Service: Podiatry;  Laterality: Right;   KNEE ARTHROSCOPY     bilateral   LEFT HEART CATH AND CORONARY ANGIOGRAPHY N/A 06/04/2020   Procedure: LEFT HEART CATH AND CORONARY ANGIOGRAPHY;  Surgeon: Wellington Hampshire, MD;  Location: [facility];  Service: Cardiovascular;  Laterality: N/A;   SINUSOTOMY     TOOTH EXTRACTION  12/2016   VAGINAL HYSTERECTOMY       reports that she has never smoked. She has never used smokeless tobacco. She reports that she does not drink alcohol and does not use drugs.  Allergies  Allergen Reactions   Fluoxetine     Headache, shaking, sleep issues   Codeine     Nausea and vomiting/only when taking too much    Family History  Problem Relation Age of Onset   Emphysema Father        smoked   Arthritis Father    Tuberculosis Mother    Parkinsonism Mother    Cancer Sister    Diabetes type II Sister    Breast cancer Sister    Breast cancer Maternal Aunt    Tuberculosis Sister       Prior to Admission medications   Medication Sig Start Date End Date Taking? Authorizing Provider  amiodarone (PACERONE) 200 MG tablet Take 1 tablet (200 mg total) by mouth daily. Take one tab daily. 06/29/20   Minna Merritts, MD  apixaban (ELIQUIS) 5 MG TABS tablet TAKE 1 TABLET(5 MG) BY MOUTH TWICE DAILY 10/13/20   Minna Merritts, MD  atorvastatin (LIPITOR) 20 MG tablet TAKE 1 TABLET BY MOUTH DAILY AT 6 PM 01/25/20   Venia Carbon, MD  Certolizumab Pegol [name]) Inject into the skin every 30 (thirty) days.    [provider]  Crisaborole (EUCRISA) 2 % OINT Apply to aa's BID PRN flares. 02/10/20   Brendolyn Patty, MD  fluconazole (DIFLUCAN) 200 MG tablet Take 1 tablet (200 mg total) by mouth daily. For 7 days for 1 week 10/19/20   Brendolyn Patty, MD  gabapentin (NEURONTIN) 300 MG capsule Take 1 capsule (300 mg total) by mouth 4 (four) times daily. 01/20/20   Venia Carbon, MD  ketoconazole  (NIZORAL) 2 % cream APPLY A SMALL AMOUNT TO AFFECTED AREA ONCE A DAY TO RASH ON BUTTOCKS, GROIN 10/27/20   Brendolyn Patty, MD  leflunomide (ARAVA) 20 MG tablet Take 20 mg by mouth daily.    [provider]  meclizine (ANTIVERT) 25 MG tablet TAKE 1 TABLET(25 MG) BY MOUTH THREE TIMES DAILY AS NEEDED FOR DIZZINESS 10/20/19   Venia Carbon, MD  metolazone (ZAROXOLYN) 2.5 MG tablet Take 2.5 mg by mouth daily as needed.    [provider]  metoprolol succinate (TOPROL-XL) 50 MG 24 hr tablet TAKE 1 TABLET BY MOUTH EVERY DAY WITH OR  IMMEDIATELY FOLLOWING A MEAL 01/25/20   Viviana Simpler I, MD  montelukast (SINGULAIR) 10 MG tablet TAKE 1 TABLET BY MOUTH AT BEDTIME 02/02/20   Venia Carbon, MD  mupirocin ointment (BACTROBAN) 2 % Apply 1 application topically 2 (two) times daily. Qd to burn wounds 03/22/20   Brendolyn Patty, MD  nitroGLYCERIN (NITROSTAT) 0.4 MG SL tablet Place 1 tablet (0.4 mg total) under the tongue every 5 (five) minutes as needed for chest pain. 06/05/20   Fritzi Mandes, MD  nystatin (MYCOSTATIN/NYSTOP) powder Apply 1 application topically 3 (three) times daily as needed.    [provider]  Oxycodone HCl 20 MG TABS Take 1 tablet (20 mg total) by mouth in the morning and at bedtime. 10/31/20   Venia Carbon, MD  polyethylene glycol (MIRALAX / Floria Raveling) packet Take 17 g by mouth daily as needed for mild constipation. 07/16/17   Henreitta Leber, MD  Potassium Chloride ER 20 MEQ TBCR Take 20 mEq by mouth in the morning and at bedtime. 07/01/20   Gollan, Kathlene November, MD  SF 5000 PLUS 1.1 % CREA dental cream Place 1 application onto teeth 2 (two) times daily. 06/01/17   [provider]  spironolactone (ALDACTONE) 25 MG tablet TAKE 1 TABLET(25 MG) BY MOUTH DAILY 05/17/20   Viviana Simpler I, MD  terazosin (HYTRIN) 10 MG capsule TAKE 1 CAPSULE(10 MG) BY MOUTH AT BEDTIME 01/03/20   Viviana Simpler I, MD  torsemide (DEMADEX) 20 MG tablet Take 40 mg by mouth daily. Takes an  extra tablet when feeling SOB or has noticeable edema    [provider]    Physical Exam: Vitals:   11/10/20 1933 11/10/20 1934 11/10/20 1938 11/10/20 1949  BP:  134/61    Pulse: (!) 45 (!) 45 (!) 45 (!) 47  Resp: (!) 8 (!) 7 (!) 7 12  Temp:      TempSrc:      SpO2: 94% 93% 95% 96%  Weight:      Height:         Vitals:   11/10/20 1933 11/10/20 1934 11/10/20 1938 11/10/20 1949  BP:  134/61    Pulse: (!) 45 (!) 45 (!) 45 (!) 47  Resp: (!) 8 (!) 7 (!) 7 12  Temp:      TempSrc:      SpO2: 94% 93% 95% 96%  Weight:      Height:          Constitutional: Alert and oriented x 3 .  In some pain discomfort HEENT:      Head: Normocephalic and atraumatic.         Eyes: PERLA, EOMI, Conjunctivae are normal. Sclera is non-icteric.       Mouth/Throat: Mucous membranes are moist.       Neck: Supple with no signs of meningismus. Cardiovascular: Regular rate and rhythm. No murmurs, gallops, or rubs. 2+ symmetrical distal pulses are present . No JVD.  3+ bilateral LE edema Respiratory: Respiratory effort normal .Lungs sounds clear bilaterally. No wheezes, crackles, or rhonchi.  Gastrointestinal: Soft, non tender, and non distended with positive bowel sounds.  Genitourinary: No CVA tenderness. Musculoskeletal: Bulge anterior right shoulder suggestive of dislocation.  Right arm in sling. No cyanosis, or erythema of extremities.  Neurovascularly intact Neurologic:  Face is symmetric. Moving all extremities. No gross focal neurologic deficits . Skin: Skin is warm, dry.  No rash or ulcers Psychiatric: Mood and affect are normal    Labs on Admission: I  have personally reviewed following labs and imaging studies  CBC: No results for input(s): WBC, NEUTROABS, HGB, HCT, MCV, PLT in the last 168 hours. Basic Metabolic Panel: No results for input(s): NA, K, CL, CO2, GLUCOSE, BUN, CREATININE, CALCIUM, MG, PHOS in the last 168 hours. GFR: CrCl cannot be calculated (Patient's most  recent lab result is older than the maximum 21 days allowed.). Liver Function Tests: No results for input(s): AST, ALT, ALKPHOS, BILITOT, PROT, ALBUMIN in the last 168 hours. No results for input(s): LIPASE, AMYLASE in the last 168 hours. No results for input(s): AMMONIA in the last 168 hours. Coagulation Profile: No results for input(s): INR, PROTIME in the last 168 hours. Cardiac Enzymes: No results for input(s): CKTOTAL, CKMB, CKMBINDEX, TROPONINI in the last 168 hours. BNP (last 3 results) No results for input(s): PROBNP in the last 8760 hours. HbA1C: No results for input(s): HGBA1C in the last 72 hours. CBG: No results for input(s): GLUCAP in the last 168 hours. Lipid Profile: No results for input(s): CHOL, HDL, LDLCALC, TRIG, CHOLHDL, LDLDIRECT in the last 72 hours. Thyroid Function Tests: No results for input(s): TSH, T4TOTAL, FREET4, T3FREE, THYROIDAB in the last 72 hours. Anemia Panel: No results for input(s): VITAMINB12, FOLATE, FERRITIN, TIBC, IRON, RETICCTPCT in the last 72 hours. Urine analysis:    Component Value Date/Time   COLORURINE YELLOW (A) 01/28/2020 1256   APPEARANCEUR HAZY (A) 01/28/2020 1256   APPEARANCEUR Cloudy 03/28/2014 1033   LABSPEC 1.016 01/28/2020 1256   LABSPEC 1.018 03/28/2014 1033   PHURINE 5.0 01/28/2020 1256   GLUCOSEU NEGATIVE 01/28/2020 1256   GLUCOSEU Negative 03/28/2014 1033   HGBUR NEGATIVE 01/28/2020 1256   BILIRUBINUR NEGATIVE 01/28/2020 1256   BILIRUBINUR Negative 03/28/2014 1033   KETONESUR NEGATIVE 01/28/2020 1256   PROTEINUR NEGATIVE 01/28/2020 1256   NITRITE NEGATIVE 01/28/2020 1256   LEUKOCYTESUR NEGATIVE 01/28/2020 1256   LEUKOCYTESUR 3+ 03/28/2014 1033    Radiological Exams on Admission: DG Shoulder Right  Result Date: 11/10/2020 CLINICAL DATA:  dislocation EXAM: RIGHT SHOULDER - 2+ VIEW COMPARISON:  Shoulder x-ray dated September 23, 2018; chest x-ray dated May 28, 2020 FINDINGS: Right anterior shoulder dislocation. No  definite fracture. Mild degenerative changes of the acromioclavicular joint. Mild interstitial opacities of the visualized lung, similar to prior chest x-ray. IMPRESSION: Right anterior shoulder dislocation. No definite fracture, although evaluation is limited due to overlying soft tissues. Correlate with shoulder CT. Electronically Signed   By: Yetta Glassman M.D.   On: 11/10/2020 19:55     Assessment/Plan 70 year old female with history of rheumatoid arthritis on Arava, CAD, paroxysmal A. fib on Eliquis, chronic diastolic heart failure, HTN,  sleep apnea, morbid obesity with BMI of 50 who presenting with nontraumatic  right shoulder dislocation occurring two weeks prior to arrival.    Nontraumatic closed dislocation of right shoulder Fracture of the posterosuperior humeral head -Unsuccessful attempted reduction in the ED - Ortho, Dr Roland Rack will take patient to the OR in the a.m. - Pain control - Continue sling for now -N.p.o. after midnight -Addendum: CT: Anterior shoulder dislocation with impaction fracture of the posterosuperior humeral head. Severe subacromial/subdeltoid bursitis - We will replace Eliquis with Lovenox as surgery might be needed  Paroxysmal atrial fibrillation   Long term current use of anticoagulant therapy - Rate controlled on metoprolol and amiodarone - lovenox overnight    Morbid obesity with BMI of 50.0-59.9, adult (HCC) - Complicating factor to overall prognosis    HTN (hypertension) - Continue metoprolol    CAD (  coronary artery disease) - No complaints of chest pain - Patient had a cardiac cath in May 2022 that showed nonobstructive CAD - Continue metoprolol, atorvastatin, nitroglycerin    Rheumatoid arthritis (HCC) - Continue Arava  Chronic diastolic heart failure - Continue metoprolol, spironolactone, torsemide    Sleep apnea - CPAP if desired     DVT prophylaxis: lovenox Code Status: full code  Family Communication: Daughter at  bedside Disposition Plan: Back to previous home environment Consults called: nonePoggi  Status:observation    Athena Masse MD [facility]     11/10/2020, 8:05 PM

## 2020-11-10 NOTE — ED Provider Notes (Signed)
[facility] Emergency Department Provider Note  .Sedation  Date/Time: 11/10/2020 8:28 PM Performed by: Nance Pear, MD Authorized by: Nance Pear, MD   Consent:    Consent obtained:  Verbal   Consent given by:  Patient   Risks discussed:  Allergic reaction, nausea, vomiting, prolonged sedation necessitating reversal and inadequate sedation   Alternatives discussed:  Analgesia without sedation Universal protocol:    Immediately prior to procedure, a time out was called: yes   Indications:    Procedure performed:  Dislocation reduction   Procedure necessitating sedation performed by:  Different physician Pre-sedation assessment:    Time since last food or drink:  Unknown   NPO status caution: urgency dictates proceeding with non-ideal NPO status     ASA classification: class 3 - patient with severe systemic disease     Mouth opening:  2 finger widths   Mallampati score:  IV - only hard palate visible   Neck mobility: reduced     Pre-sedation assessments completed and reviewed: airway patency, cardiovascular function, hydration status, mental status, nausea/vomiting, pain level, respiratory function and temperature   Immediate pre-procedure details:    Reassessment: Patient reassessed immediately prior to procedure     Reviewed: vital signs   Procedure details (see MAR for exact dosages):    Preoxygenation:  Nasal cannula   Sedation:  Midazolam   Intended level of sedation: moderate (conscious sedation)   Analgesia:  Fentanyl   Intra-procedure monitoring:  Blood pressure monitoring, cardiac monitor, continuous pulse oximetry, frequent LOC assessments and frequent vital sign checks   Intra-procedure events: none     Total Provider sedation time (minutes):  15 Post-procedure details:    Post-sedation assessments completed and reviewed: airway patency, cardiovascular function, hydration status, mental status, nausea/vomiting, pain level, respiratory  function and temperature     Patient is stable for discharge or admission: yes     Procedure completion:  Tolerated well, no immediate complications    Nance Pear, MD 11/10/20 2035

## 2020-11-10 NOTE — ED Provider Notes (Signed)
[facility] Emergency Department Provider Note ____________________________________________   Event Date/Time   First MD Initiated Contact with Patient 11/10/20 1717     (approximate)  I have reviewed the triage vital signs and the nursing notes.   HISTORY  Chief Complaint Shoulder Injury  HPI ALGIA LEWISON is a 70 y.o. female with history of COPD, A. fib, and remaining history as listed below presents to the emergency department for treatment and evaluation of shoulder pain for the past 2 weeks.  She was evaluated at [facility] who performed imaging and sent her to the emergency department due to shoulder dislocation.  Patient states that injury occurred by raising her arm in the air and she immediately felt pain.  No other injuries..         Past Medical History:  Diagnosis Date   Arthritis    RA   Asthma    Basal cell carcinoma    Cataract 2018   bilateral eyes; corrected with surgery   Claustrophobia    Collagen vascular disease ([place])    RA   COPD (chronic obstructive pulmonary disease) (HCC)    Diastolic dysfunction    a. echo 07/2014: EF 55-60%, no RWMA, GR2DD, mild MR, LA moderately dilated, PASP 38 mm Hg   Dyslipidemia    Headache    migraines   Hemihypertrophy    History of cardiac cath    a. cardiac cath 05/24/2010 - nonobstructive CAD   History of gout    Hyperplastic colonic polyp 2003   Hypertension    Hypokalemia    Morbid obesity ([place])    PAF (paroxysmal atrial fibrillation) ([place])    a. on Pradaxa; b. CHADSVASc at least 2 (HTN & female)   Rheumatoid arthritis(714.0)    Sleep apnea    a. not compliant with CPAP    Patient Active Problem List   Diagnosis Date Noted   Closed dislocation of right shoulder 11/10/2020   Chronic dislocation of right shoulder 11/10/2020   Stage 3b chronic kidney disease ([place]) 07/07/2020   Abnormal nuclear stress test    Demand ischemia ([place])    Polymyalgia rheumatica ([place]) 02/03/2020    Debility 01/28/2020   Intertrigo of genitocrural region due to Candida species 01/05/2020   Intertrigo 01/05/2020   Chronic diastolic heart failure ([place])    Pressure injury of skin 03/31/2019   COPD (chronic obstructive pulmonary disease) ([place]) 03/30/2019   Urge incontinence of urine 12/23/2018   Left leg pain 09/18/2018   Chronic gouty arthropathy without tophi 04/01/2018   Preventative health care 03/20/2018   Mood disorder ([place]) 03/20/2018   Advance directive discussed with patient 03/20/2018   Lymphedema 11/26/2017   Narcotic dependence ([place]) 03/09/2017   Vertigo 06/28/2016   Bronchiectasis without acute exacerbation ([place]) 06/15/2015   DDD (degenerative disc disease), lumbosacral 03/30/2015   Sleep apnea    Atherosclerotic heart disease of native coronary artery with angina pectoris ([place]) 08/25/2014   Paroxysmal A-fib ([place]) 08/25/2014   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   RLS (restless legs syndrome) 07/06/2014   Gout 05/26/2014   Chronic pain syndrome 05/26/2014   Migraine 04/08/2014   Acquired deformity of arm 01/05/2014   Back pain 07/22/2013   Metatarsalgia of right foot 07/22/2013   Insomnia 07/15/2013   OA (osteoarthritis) 11/20/2012   DJD (degenerative joint disease) of cervical spine 09/10/2012   Trapezius muscle spasm 07/21/2011   Allergic rhinitis 07/10/2011   Rheumatoid arthritis ([place]) 03/06/2011   Morbid obesity with  BMI of 50.0-59.9, adult ([place]) 06/22/2010   HLD (hyperlipidemia) 06/22/2010   HTN (hypertension) 06/22/2010   CAD (coronary artery disease) 06/22/2010    Past Surgical History:  Procedure Laterality Date   ABDOMINAL HYSTERECTOMY     APPLICATION OF WOUND VAC Right 08/09/2017   Procedure: APPLICATION OF WOUND VAC;  Surgeon: Albertine Patricia, DPM;  Location: [facility] ORS;  Service: Podiatry;  Laterality: Right;   CARDIAC CATHETERIZATION  05/24/2010   nonobstructive CAD   CATARACT EXTRACTION W/ INTRAOCULAR LENS IMPLANT Right 06/12/2016    Dr. Darleen Crocker   CATARACT EXTRACTION W/ INTRAOCULAR LENS IMPLANT Left 06/26/2016   Dr. Darleen Crocker   CESAREAN SECTION     CHOLECYSTECTOMY     COLONOSCOPY  06/12/2011   Procedure: COLONOSCOPY;  Surgeon: Juanita Craver, MD;  Location: [facility];  Service: Endoscopy;  Laterality: N/A;   COLONOSCOPY N/A 03/17/2013   Procedure: COLONOSCOPY;  Surgeon: Juanita Craver, MD;  Location: [facility];  Service: Endoscopy;  Laterality: N/A;   EYE SURGERY     FOOT ARTHRODESIS Right 07/13/2017   Procedure: ARTHRODESIS FOOT-MULTI.FUSIONS (6 JOINTS);  Surgeon: Albertine Patricia, DPM;  Location: [facility] ORS;  Service: Podiatry;  Laterality: Right;   IRRIGATION AND DEBRIDEMENT FOOT Right 08/09/2017   Procedure: IRRIGATION AND DEBRIDEMENT FOOT;  Surgeon: Albertine Patricia, DPM;  Location: [facility] ORS;  Service: Podiatry;  Laterality: Right;   KNEE ARTHROSCOPY     bilateral   LEFT HEART CATH AND CORONARY ANGIOGRAPHY N/A 06/04/2020   Procedure: LEFT HEART CATH AND CORONARY ANGIOGRAPHY;  Surgeon: Wellington Hampshire, MD;  Location: [facility];  Service: Cardiovascular;  Laterality: N/A;   SINUSOTOMY     TOOTH EXTRACTION  12/2016   VAGINAL HYSTERECTOMY      Prior to Admission medications   Medication Sig Start Date End Date Taking? Authorizing Provider  amiodarone (PACERONE) 200 MG tablet Take 1 tablet (200 mg total) by mouth daily. Take one tab daily. 06/29/20   Minna Merritts, MD  apixaban (ELIQUIS) 5 MG TABS tablet TAKE 1 TABLET(5 MG) BY MOUTH TWICE DAILY 10/13/20   Minna Merritts, MD  atorvastatin (LIPITOR) 20 MG tablet TAKE 1 TABLET BY MOUTH DAILY AT 6 PM 01/25/20   Venia Carbon, MD  Certolizumab [facility]) Inject into the skin every 30 (thirty) days.    [provider]  Crisaborole (EUCRISA) 2 % OINT Apply to aa's BID PRN flares. 02/10/20   Brendolyn Patty, MD  fluconazole (DIFLUCAN) 200 MG tablet Take 1 tablet (200 mg total) by mouth daily. For 7 days for 1 week 10/19/20   Brendolyn Patty, MD  gabapentin (NEURONTIN) 300 MG capsule Take 1 capsule (300 mg total) by mouth 4 (four) times daily. 01/20/20   Venia Carbon, MD  ketoconazole (NIZORAL) 2 % cream APPLY A SMALL AMOUNT TO AFFECTED AREA ONCE A DAY TO RASH ON BUTTOCKS, GROIN 10/27/20   Brendolyn Patty, MD  leflunomide (ARAVA) 20 MG tablet Take 20 mg by mouth daily.    [provider]  meclizine (ANTIVERT) 25 MG tablet TAKE 1 TABLET(25 MG) BY MOUTH THREE TIMES DAILY AS NEEDED FOR DIZZINESS 10/20/19   Venia Carbon, MD  metolazone (ZAROXOLYN) 2.5 MG tablet Take 2.5 mg by mouth daily as needed.    [provider]  metoprolol succinate (TOPROL-XL) 50 MG 24 hr tablet TAKE 1 TABLET BY MOUTH EVERY DAY WITH OR IMMEDIATELY FOLLOWING A MEAL 01/25/20   Viviana Simpler I, MD  montelukast (SINGULAIR) 10 MG tablet  TAKE 1 TABLET BY MOUTH AT BEDTIME 02/02/20   Venia Carbon, MD  mupirocin ointment (BACTROBAN) 2 % Apply 1 application topically 2 (two) times daily. Qd to burn wounds 03/22/20   Brendolyn Patty, MD  nitroGLYCERIN (NITROSTAT) 0.4 MG SL tablet Place 1 tablet (0.4 mg total) under the tongue every 5 (five) minutes as needed for chest pain. 06/05/20   Fritzi Mandes, MD  nystatin (MYCOSTATIN/NYSTOP) powder Apply 1 application topically 3 (three) times daily as needed.    [provider]  Oxycodone HCl 20 MG TABS Take 1 tablet (20 mg total) by mouth in the morning and at bedtime. 10/31/20   Venia Carbon, MD  polyethylene glycol (MIRALAX / Floria Raveling) packet Take 17 g by mouth daily as needed for mild constipation. 07/16/17   Henreitta Leber, MD  Potassium Chloride ER 20 MEQ TBCR Take 20 mEq by mouth in the morning and at bedtime. 07/01/20   Gollan, Kathlene November, MD  SF 5000 PLUS 1.1 % CREA dental cream Place 1 application onto teeth 2 (two) times daily. 06/01/17   [provider]  spironolactone (ALDACTONE) 25 MG tablet TAKE 1 TABLET(25 MG) BY MOUTH DAILY 05/17/20   Viviana Simpler I, MD  terazosin (HYTRIN)  10 MG capsule TAKE 1 CAPSULE(10 MG) BY MOUTH AT BEDTIME 01/03/20   Viviana Simpler I, MD  torsemide (DEMADEX) 20 MG tablet Take 40 mg by mouth daily. Takes an extra tablet when feeling SOB or has noticeable edema    [provider]    Allergies Fluoxetine and Codeine  Family History  Problem Relation Age of Onset   Emphysema Father        smoked   Arthritis Father    Tuberculosis Mother    Parkinsonism Mother    Cancer Sister    Diabetes type II Sister    Breast cancer Sister    Breast cancer Maternal Aunt    Tuberculosis Sister     Social History Social History   Tobacco Use   Smoking status: Never   Smokeless tobacco: Never  Vaping Use   Vaping Use: Never used  Substance Use Topics   Alcohol use: No   Drug use: No    Review of Systems  Constitutional: No fever/chills Eyes: No visual changes. ENT: No sore throat. Cardiovascular: Denies chest pain. Respiratory: Denies shortness of breath. Gastrointestinal: No abdominal pain.  No nausea, no vomiting.  No diarrhea.  No constipation. Genitourinary: Negative for dysuria. Musculoskeletal: Positive for right shoulder pain Skin: Negative for rash. Neurological: Negative for headaches, focal weakness or numbness.  ____________________________________________   PHYSICAL EXAM:  VITAL SIGNS: ED Triage Vitals  Enc Vitals Group     BP 11/10/20 1702 (!) 144/91     Pulse Rate 11/10/20 1702 61     Resp 11/10/20 1702 20     Temp 11/10/20 1702 97.9 F (36.6 C)     Temp Source 11/10/20 1702 Oral     SpO2 11/10/20 1702 96 %     Weight 11/10/20 1700 290 lb (131.5 kg)     Height 11/10/20 1700 '5\' 3"'$  (1.6 m)     Head Circumference --      Peak Flow --      Pain Score 11/10/20 1702 10     Pain Loc --      Pain Edu? --      Excl. in [place]? --     Constitutional: Alert and oriented.  Chronically ill appearing and in no acute  distress. Eyes: Conjunctivae are normal. PERRL. EOMI. Head: Atraumatic. Nose: No  congestion/rhinnorhea. Mouth/Throat: Mucous membranes are moist.  Oropharynx non-erythematous. Neck: No stridor.   Hematological/Lymphatic/Immunilogical: No cervical lymphadenopathy. Cardiovascular: Normal rate, regular rhythm. Grossly normal heart sounds.  Good peripheral circulation. Respiratory: Normal respiratory effort.  No retractions. Lungs CTAB. Gastrointestinal: Soft and nontender. No distention. No abdominal bruits. Genitourinary:  Musculoskeletal: Pain with any attempt to move the right upper extremity.. Neurologic:  Normal speech and language. No gross focal neurologic deficits are appreciated. No gait instability. Skin:  Skin is warm, dry and intact. No rash noted. Psychiatric: Mood and affect are normal. Speech and behavior are normal.  ____________________________________________   LABS (all labs ordered are listed, but only abnormal results are displayed)  Labs Reviewed  RESP PANEL BY RT-PCR (FLU A&B, COVID) ARPGX2  BASIC METABOLIC PANEL  CBC WITH DIFFERENTIAL/PLATELET   ____________________________________________  EKG  Not indicated ____________________________________________  RADIOLOGY  ED MD interpretation:    Images brought from [facility] show shoulder dislocation.  I, Sherrie George, personally viewed and evaluated these images (plain radiographs) as part of my medical decision making, as well as reviewing the written report by the radiologist.  Official radiology report(s): DG Shoulder Right  Result Date: 11/10/2020 CLINICAL DATA:  dislocation EXAM: RIGHT SHOULDER - 2+ VIEW COMPARISON:  Shoulder x-ray dated September 23, 2018; chest x-ray dated May 28, 2020 FINDINGS: Right anterior shoulder dislocation. No definite fracture. Mild degenerative changes of the acromioclavicular joint. Mild interstitial opacities of the visualized lung, similar to prior chest x-ray. IMPRESSION: Right anterior shoulder dislocation. No definite fracture, although evaluation is  limited due to overlying soft tissues. Correlate with shoulder CT. Electronically Signed   By: Yetta Glassman M.D.   On: 11/10/2020 19:55    ____________________________________________   PROCEDURES  Procedure(s) performed (including Critical Care):  Reduction of dislocation  Date/Time: 11/10/2020 7:46 PM Performed by: Victorino Dike, FNP Authorized by: Victorino Dike, FNP  Consent: Verbal consent obtained. Risks and benefits: risks, benefits and alternatives were discussed Consent given by: patient Patient understanding: patient states understanding of the procedure being performed Imaging studies: imaging studies available Patient identity confirmed: verbally with patient and arm band Time out: Immediately prior to procedure a "time out" was called to verify the correct patient, procedure, equipment, support staff and site/side marked as required.  Sedation: Patient sedated: yes Sedation type: moderate (conscious) sedation Sedatives: midazolam Analgesia: fentanyl Vitals: Vital signs were monitored during sedation.  Patient tolerance: patient tolerated the procedure well with no immediate complications Comments: Reduction not successful.    ____________________________________________   INITIAL IMPRESSION / ASSESSMENT AND PLAN     70 year old female presenting to the emergency department for reduction of shoulder dislocation.  See HPI for further details.   ED COURSE  Procedure regarding attempt to perform reduction of the right shoulder was explained to the patient and family member.  Risks and benefits discussed.  Patient states that she has not had any previous difficulty with sedation or anesthesia.   Time out called prior to administration of sedatives. Dr. Archie Balboa in room for sedation procedure as well as RN and RT.  Reduction not successful.  Dr. Roland Rack with orthopedics consulted.  Plan will be to get repeat images including a CT and admit via hospitalist  service. Patient's family aware of the plan. Patient stable at this time. Respirations are even and unlabored. She awakens to voice.  Dr. Damita Dunnings accepts patient for admission.     ___________________________________________   FINAL  CLINICAL IMPRESSION(S) / ED DIAGNOSES  Final diagnoses:  Shoulder dislocation, right, initial encounter     ED Discharge Orders     None        ALONNI BERGGREN was evaluated in Emergency Department on 11/10/2020 for the symptoms described in the history of present illness. She was evaluated in the context of the global COVID-19 pandemic, which necessitated consideration that the patient might be at risk for infection with the SARS-CoV-2 virus that causes COVID-19. Institutional protocols and algorithms that pertain to the evaluation of patients at risk for COVID-19 are in a state of rapid change based on information released by regulatory bodies including the CDC and federal and state organizations. These policies and algorithms were followed during the patient's care in the ED.   Note:  This document was prepared using Dragon voice recognition software and may include unintentional dictation errors.    Victorino Dike, FNP 11/10/20 Patrecia Pour    Nance Pear, MD 11/10/20 2001

## 2020-11-10 NOTE — ED Notes (Signed)
IV team at bedside 

## 2020-11-10 NOTE — ED Notes (Signed)
Pt assisted to the bathroom to urinate. Hand hygiene preformed. Pt taken to rm 19 and assisted to the stretcher. Joellen Jersey, RN at bedside.

## 2020-11-10 NOTE — ED Notes (Signed)
Called lab for help with blood draw

## 2020-11-10 NOTE — ED Notes (Signed)
This RN and Sam RN attempted iv start x2. Unsuccessful with IV start. Order placed for IV team.

## 2020-11-10 NOTE — Sedation Documentation (Signed)
Pt to CT

## 2020-11-10 NOTE — ED Triage Notes (Signed)
Pt to ED for ED with daughter for right shoulder dislocation confirmed by xray. Unsure mechanism of injury. Denies fall Obvious deformity noted

## 2020-11-11 ENCOUNTER — Observation Stay: Payer: Medicare Other

## 2020-11-11 ENCOUNTER — Observation Stay: Payer: Medicare Other | Admitting: Anesthesiology

## 2020-11-11 ENCOUNTER — Encounter: Payer: Self-pay | Admitting: Internal Medicine

## 2020-11-11 ENCOUNTER — Encounter
Admission: EM | Disposition: A | Discharge status: Skilled Nursing Facility | Payer: Self-pay | Source: Home / Self Care | Attending: Internal Medicine

## 2020-11-11 DIAGNOSIS — Z4789 Encounter for other orthopedic aftercare: Secondary | ICD-10-CM | POA: Diagnosis not present

## 2020-11-11 DIAGNOSIS — S43004D Unspecified dislocation of right shoulder joint, subsequent encounter: Secondary | ICD-10-CM | POA: Diagnosis not present

## 2020-11-11 DIAGNOSIS — M75121 Complete rotator cuff tear or rupture of right shoulder, not specified as traumatic: Secondary | ICD-10-CM | POA: Diagnosis not present

## 2020-11-11 DIAGNOSIS — M353 Polymyalgia rheumatica: Secondary | ICD-10-CM | POA: Diagnosis present

## 2020-11-11 DIAGNOSIS — Z23 Encounter for immunization: Secondary | ICD-10-CM | POA: Diagnosis not present

## 2020-11-11 DIAGNOSIS — X500XXA Overexertion from strenuous movement or load, initial encounter: Secondary | ICD-10-CM | POA: Diagnosis not present

## 2020-11-11 DIAGNOSIS — R600 Localized edema: Secondary | ICD-10-CM | POA: Diagnosis not present

## 2020-11-11 DIAGNOSIS — S42291A Other displaced fracture of upper end of right humerus, initial encounter for closed fracture: Secondary | ICD-10-CM | POA: Diagnosis present

## 2020-11-11 DIAGNOSIS — R2681 Unsteadiness on feet: Secondary | ICD-10-CM | POA: Diagnosis not present

## 2020-11-11 DIAGNOSIS — J449 Chronic obstructive pulmonary disease, unspecified: Secondary | ICD-10-CM | POA: Diagnosis present

## 2020-11-11 DIAGNOSIS — J189 Pneumonia, unspecified organism: Secondary | ICD-10-CM | POA: Diagnosis not present

## 2020-11-11 DIAGNOSIS — Z9889 Other specified postprocedural states: Secondary | ICD-10-CM | POA: Diagnosis not present

## 2020-11-11 DIAGNOSIS — S43004S Unspecified dislocation of right shoulder joint, sequela: Secondary | ICD-10-CM | POA: Diagnosis not present

## 2020-11-11 DIAGNOSIS — Z20822 Contact with and (suspected) exposure to covid-19: Secondary | ICD-10-CM | POA: Diagnosis present

## 2020-11-11 DIAGNOSIS — M069 Rheumatoid arthritis, unspecified: Secondary | ICD-10-CM | POA: Diagnosis present

## 2020-11-11 DIAGNOSIS — R1312 Dysphagia, oropharyngeal phase: Secondary | ICD-10-CM | POA: Diagnosis not present

## 2020-11-11 DIAGNOSIS — E785 Hyperlipidemia, unspecified: Secondary | ICD-10-CM | POA: Diagnosis present

## 2020-11-11 DIAGNOSIS — N1832 Chronic kidney disease, stage 3b: Secondary | ICD-10-CM | POA: Diagnosis not present

## 2020-11-11 DIAGNOSIS — Z471 Aftercare following joint replacement surgery: Secondary | ICD-10-CM | POA: Diagnosis not present

## 2020-11-11 DIAGNOSIS — I5032 Chronic diastolic (congestive) heart failure: Secondary | ICD-10-CM | POA: Diagnosis present

## 2020-11-11 DIAGNOSIS — M25311 Other instability, right shoulder: Secondary | ICD-10-CM | POA: Diagnosis present

## 2020-11-11 DIAGNOSIS — M75101 Unspecified rotator cuff tear or rupture of right shoulder, not specified as traumatic: Secondary | ICD-10-CM | POA: Diagnosis present

## 2020-11-11 DIAGNOSIS — Z85828 Personal history of other malignant neoplasm of skin: Secondary | ICD-10-CM | POA: Diagnosis not present

## 2020-11-11 DIAGNOSIS — M6281 Muscle weakness (generalized): Secondary | ICD-10-CM | POA: Diagnosis not present

## 2020-11-11 DIAGNOSIS — Z888 Allergy status to other drugs, medicaments and biological substances status: Secondary | ICD-10-CM | POA: Diagnosis not present

## 2020-11-11 DIAGNOSIS — Z885 Allergy status to narcotic agent status: Secondary | ICD-10-CM | POA: Diagnosis not present

## 2020-11-11 DIAGNOSIS — Z7901 Long term (current) use of anticoagulants: Secondary | ICD-10-CM | POA: Diagnosis not present

## 2020-11-11 DIAGNOSIS — I2510 Atherosclerotic heart disease of native coronary artery without angina pectoris: Secondary | ICD-10-CM | POA: Diagnosis present

## 2020-11-11 DIAGNOSIS — Z96611 Presence of right artificial shoulder joint: Secondary | ICD-10-CM | POA: Diagnosis not present

## 2020-11-11 DIAGNOSIS — I130 Hypertensive heart and chronic kidney disease with heart failure and stage 1 through stage 4 chronic kidney disease, or unspecified chronic kidney disease: Secondary | ICD-10-CM | POA: Diagnosis present

## 2020-11-11 DIAGNOSIS — S43004A Unspecified dislocation of right shoulder joint, initial encounter: Secondary | ICD-10-CM | POA: Diagnosis not present

## 2020-11-11 DIAGNOSIS — Z6841 Body Mass Index (BMI) 40.0 and over, adult: Secondary | ICD-10-CM | POA: Diagnosis not present

## 2020-11-11 DIAGNOSIS — M24411 Recurrent dislocation, right shoulder: Secondary | ICD-10-CM | POA: Diagnosis present

## 2020-11-11 DIAGNOSIS — I480 Paroxysmal atrial fibrillation: Secondary | ICD-10-CM | POA: Diagnosis present

## 2020-11-11 DIAGNOSIS — L89152 Pressure ulcer of sacral region, stage 2: Secondary | ICD-10-CM | POA: Diagnosis present

## 2020-11-11 DIAGNOSIS — R059 Cough, unspecified: Secondary | ICD-10-CM | POA: Diagnosis not present

## 2020-11-11 DIAGNOSIS — G4733 Obstructive sleep apnea (adult) (pediatric): Secondary | ICD-10-CM | POA: Diagnosis present

## 2020-11-11 DIAGNOSIS — N1831 Chronic kidney disease, stage 3a: Secondary | ICD-10-CM | POA: Diagnosis present

## 2020-11-11 HISTORY — PX: SHOULDER INJECTION: SHX5048

## 2020-11-11 HISTORY — PX: SHOULDER CLOSED REDUCTION: SHX1051

## 2020-11-11 SURGERY — CLOSED REDUCTION, SHOULDER
Anesthesia: General | Site: Shoulder | Laterality: Right

## 2020-11-11 MED ORDER — FENTANYL CITRATE (PF) 100 MCG/2ML IJ SOLN
INTRAMUSCULAR | Status: DC | PRN
  Administered 2020-11-11 (×2): 50 ug via INTRAVENOUS

## 2020-11-11 MED ORDER — METOCLOPRAMIDE HCL 5 MG/ML IJ SOLN: 5.0000 mg | Freq: Three times a day (TID) | INTRAMUSCULAR | Status: DC | PRN

## 2020-11-11 MED ORDER — OXYCODONE HCL 5 MG PO TABS
ORAL_TABLET | ORAL | Status: AC
  Administered 2020-11-11: 5 mg via ORAL
  Filled 2020-11-11: qty 1

## 2020-11-11 MED ORDER — APIXABAN 5 MG PO TABS
5.0000 mg | ORAL_TABLET | Freq: Two times a day (BID) | ORAL | Status: DC
  Administered 2020-11-11 – 2020-11-15 (×8): 5 mg via ORAL
  Filled 2020-11-11 (×9): qty 1

## 2020-11-11 MED ORDER — ENOXAPARIN SODIUM 40 MG/0.4ML IJ SOSY: 40.0000 mg | PREFILLED_SYRINGE | INTRAMUSCULAR | Status: DC

## 2020-11-11 MED ORDER — MONTELUKAST SODIUM 10 MG PO TABS
10.0000 mg | ORAL_TABLET | Freq: Every day | ORAL | Status: DC
  Administered 2020-11-11 – 2020-11-17 (×7): 10 mg via ORAL
  Filled 2020-11-11 (×7): qty 1

## 2020-11-11 MED ORDER — BUPIVACAINE HCL (PF) 0.5 % IJ SOLN
INTRAMUSCULAR | Status: AC
  Filled 2020-11-11: qty 30

## 2020-11-11 MED ORDER — EPHEDRINE 5 MG/ML INJ
INTRAVENOUS | Status: AC
  Filled 2020-11-11: qty 5

## 2020-11-11 MED ORDER — TORSEMIDE 20 MG PO TABS
40.0000 mg | ORAL_TABLET | Freq: Every day | ORAL | Status: DC
  Administered 2020-11-11 – 2020-11-18 (×8): 40 mg via ORAL
  Filled 2020-11-11 (×8): qty 2

## 2020-11-11 MED ORDER — LEFLUNOMIDE 20 MG PO TABS
20.0000 mg | ORAL_TABLET | Freq: Every day | ORAL | Status: DC
  Administered 2020-11-11 – 2020-11-18 (×8): 20 mg via ORAL
  Filled 2020-11-11 (×8): qty 1

## 2020-11-11 MED ORDER — CEFAZOLIN SODIUM-DEXTROSE 2-4 GM/100ML-% IV SOLN
INTRAVENOUS | Status: AC
  Filled 2020-11-11: qty 100

## 2020-11-11 MED ORDER — OXYCODONE HCL 5 MG PO TABS
5.0000 mg | ORAL_TABLET | Freq: Once | ORAL | Status: AC | PRN
Start: 2020-11-11 — End: 2020-11-11

## 2020-11-11 MED ORDER — TERAZOSIN HCL 5 MG PO CAPS
10.0000 mg | ORAL_CAPSULE | Freq: Every day | ORAL | Status: DC
  Administered 2020-11-11 – 2020-11-17 (×6): 10 mg via ORAL
  Filled 2020-11-11 (×8): qty 2

## 2020-11-11 MED ORDER — SODIUM CHLORIDE 0.9 % IV SOLN: INTRAVENOUS | Status: DC

## 2020-11-11 MED ORDER — SUGAMMADEX SODIUM 500 MG/5ML IV SOLN
INTRAVENOUS | Status: DC | PRN
  Administered 2020-11-11: 350 mg via INTRAVENOUS

## 2020-11-11 MED ORDER — OXYCODONE HCL 5 MG/5ML PO SOLN: 5.0000 mg | Freq: Once | ORAL | Status: AC | PRN

## 2020-11-11 MED ORDER — ONDANSETRON HCL 4 MG/2ML IJ SOLN: 4.0000 mg | Freq: Four times a day (QID) | INTRAMUSCULAR | Status: DC | PRN

## 2020-11-11 MED ORDER — KETOCONAZOLE 2 % EX CREA
TOPICAL_CREAM | Freq: Two times a day (BID) | CUTANEOUS | Status: DC | PRN
  Filled 2020-11-11: qty 15

## 2020-11-11 MED ORDER — SUGAMMADEX SODIUM 500 MG/5ML IV SOLN
INTRAVENOUS | Status: AC
  Filled 2020-11-11: qty 5

## 2020-11-11 MED ORDER — BISACODYL 10 MG RE SUPP: 10.0000 mg | Freq: Every day | RECTAL | Status: DC | PRN

## 2020-11-11 MED ORDER — PROPOFOL 10 MG/ML IV BOLUS
INTRAVENOUS | Status: DC | PRN
Start: 2020-11-11 — End: 2020-11-11
  Administered 2020-11-11: 110 mg via INTRAVENOUS

## 2020-11-11 MED ORDER — ONDANSETRON HCL 4 MG/2ML IJ SOLN
INTRAMUSCULAR | Status: DC | PRN
  Administered 2020-11-11: 4 mg via INTRAVENOUS

## 2020-11-11 MED ORDER — METOCLOPRAMIDE HCL 10 MG PO TABS: 5.0000 mg | ORAL_TABLET | Freq: Three times a day (TID) | ORAL | Status: DC | PRN

## 2020-11-11 MED ORDER — INFLUENZA VAC A&B SA ADJ QUAD 0.5 ML IM PRSY
0.5000 mL | PREFILLED_SYRINGE | INTRAMUSCULAR | Status: AC
  Administered 2020-11-13: 0.5 mL via INTRAMUSCULAR
  Filled 2020-11-11: qty 0.5

## 2020-11-11 MED ORDER — ROCURONIUM BROMIDE 10 MG/ML (PF) SYRINGE
PREFILLED_SYRINGE | INTRAVENOUS | Status: AC
  Filled 2020-11-11: qty 10

## 2020-11-11 MED ORDER — KETAMINE HCL 50 MG/5ML IJ SOSY
PREFILLED_SYRINGE | INTRAMUSCULAR | Status: AC
  Filled 2020-11-11: qty 5

## 2020-11-11 MED ORDER — DIPHENHYDRAMINE HCL 12.5 MG/5ML PO ELIX: 12.5000 mg | ORAL_SOLUTION | ORAL | Status: DC | PRN

## 2020-11-11 MED ORDER — ATORVASTATIN CALCIUM 20 MG PO TABS
20.0000 mg | ORAL_TABLET | ORAL | Status: DC
  Administered 2020-11-11 – 2020-11-17 (×7): 20 mg via ORAL
  Filled 2020-11-11 (×7): qty 1

## 2020-11-11 MED ORDER — POLYETHYLENE GLYCOL 3350 17 G PO PACK
17.0000 g | PACK | Freq: Every day | ORAL | Status: DC | PRN
  Administered 2020-11-14 – 2020-11-15 (×2): 17 g via ORAL
  Filled 2020-11-11 (×2): qty 1

## 2020-11-11 MED ORDER — SODIUM FLUORIDE 1.1 % DT CREA: 1.0000 "application " | TOPICAL_CREAM | Freq: Two times a day (BID) | DENTAL | Status: DC

## 2020-11-11 MED ORDER — SUCCINYLCHOLINE CHLORIDE 200 MG/10ML IV SOSY
PREFILLED_SYRINGE | INTRAVENOUS | Status: DC | PRN
  Administered 2020-11-11: 130 mg via INTRAVENOUS

## 2020-11-11 MED ORDER — ACETAMINOPHEN 10 MG/ML IV SOLN: 1000.0000 mg | Freq: Once | INTRAVENOUS | Status: DC | PRN

## 2020-11-11 MED ORDER — POTASSIUM CHLORIDE CRYS ER 20 MEQ PO TBCR
20.0000 meq | EXTENDED_RELEASE_TABLET | Freq: Every day | ORAL | Status: DC
  Administered 2020-11-12 – 2020-11-18 (×7): 20 meq via ORAL
  Filled 2020-11-11 (×7): qty 1

## 2020-11-11 MED ORDER — GABAPENTIN 300 MG PO CAPS
300.0000 mg | ORAL_CAPSULE | Freq: Four times a day (QID) | ORAL | Status: DC
  Administered 2020-11-11 – 2020-11-18 (×27): 300 mg via ORAL
  Filled 2020-11-11 (×27): qty 1

## 2020-11-11 MED ORDER — FENTANYL CITRATE (PF) 100 MCG/2ML IJ SOLN
INTRAMUSCULAR | Status: AC
  Administered 2020-11-11: 50 ug via INTRAVENOUS
  Filled 2020-11-11: qty 2

## 2020-11-11 MED ORDER — OXYCODONE HCL 5 MG PO TABS
20.0000 mg | ORAL_TABLET | Freq: Two times a day (BID) | ORAL | Status: DC
  Administered 2020-11-11 – 2020-11-18 (×14): 20 mg via ORAL
  Filled 2020-11-11 (×14): qty 4

## 2020-11-11 MED ORDER — ONDANSETRON HCL 4 MG PO TABS: 4.0000 mg | ORAL_TABLET | Freq: Four times a day (QID) | ORAL | Status: DC | PRN

## 2020-11-11 MED ORDER — LIDOCAINE HCL (PF) 2 % IJ SOLN
INTRAMUSCULAR | Status: AC
  Filled 2020-11-11: qty 5

## 2020-11-11 MED ORDER — TRIAMCINOLONE ACETONIDE 40 MG/ML IJ SUSP
INTRAMUSCULAR | Status: AC
  Filled 2020-11-11: qty 1

## 2020-11-11 MED ORDER — FENTANYL CITRATE (PF) 100 MCG/2ML IJ SOLN
25.0000 ug | INTRAMUSCULAR | Status: AC | PRN
  Administered 2020-11-11 (×2): 25 ug via INTRAVENOUS
  Administered 2020-11-11: 50 ug via INTRAVENOUS
  Administered 2020-11-11: 25 ug via INTRAVENOUS

## 2020-11-11 MED ORDER — AMIODARONE HCL 200 MG PO TABS
200.0000 mg | ORAL_TABLET | Freq: Every day | ORAL | Status: DC
  Administered 2020-11-11 – 2020-11-15 (×5): 200 mg via ORAL
  Filled 2020-11-11 (×8): qty 1

## 2020-11-11 MED ORDER — METOPROLOL SUCCINATE ER 50 MG PO TB24
50.0000 mg | ORAL_TABLET | Freq: Every day | ORAL | Status: DC
  Administered 2020-11-11 – 2020-11-15 (×5): 50 mg via ORAL
  Filled 2020-11-11 (×8): qty 1

## 2020-11-11 MED ORDER — DEXAMETHASONE SODIUM PHOSPHATE 10 MG/ML IJ SOLN
INTRAMUSCULAR | Status: DC | PRN
  Administered 2020-11-11: 5 mg via INTRAVENOUS

## 2020-11-11 MED ORDER — KETAMINE HCL 10 MG/ML IJ SOLN
INTRAMUSCULAR | Status: DC | PRN
  Administered 2020-11-11: 20 mg via INTRAVENOUS

## 2020-11-11 MED ORDER — SPIRONOLACTONE 25 MG PO TABS
25.0000 mg | ORAL_TABLET | Freq: Every day | ORAL | Status: DC
  Administered 2020-11-11 – 2020-11-18 (×8): 25 mg via ORAL
  Filled 2020-11-11 (×8): qty 1

## 2020-11-11 MED ORDER — NYSTATIN 100000 UNIT/GM EX POWD
1.0000 "application " | Freq: Three times a day (TID) | CUTANEOUS | Status: DC | PRN
  Administered 2020-11-12: 1 via TOPICAL
  Filled 2020-11-11 (×2): qty 15

## 2020-11-11 MED ORDER — LIDOCAINE HCL (CARDIAC) PF 100 MG/5ML IV SOSY
PREFILLED_SYRINGE | INTRAVENOUS | Status: DC | PRN
  Administered 2020-11-11: 100 mg via INTRAVENOUS

## 2020-11-11 MED ORDER — LACTATED RINGERS IV SOLN: INTRAVENOUS | Status: DC | PRN

## 2020-11-11 MED ORDER — TRIAMCINOLONE ACETONIDE 40 MG/ML IJ SUSP
INTRAMUSCULAR | Status: DC | PRN
  Administered 2020-11-11: 10 mL

## 2020-11-11 MED ORDER — MECLIZINE HCL 25 MG PO TABS
25.0000 mg | ORAL_TABLET | Freq: Three times a day (TID) | ORAL | Status: DC | PRN
  Filled 2020-11-11: qty 1

## 2020-11-11 MED ORDER — FLEET ENEMA 7-19 GM/118ML RE ENEM: 1.0000 | ENEMA | Freq: Once | RECTAL | Status: DC | PRN

## 2020-11-11 MED ORDER — FENTANYL CITRATE (PF) 100 MCG/2ML IJ SOLN
INTRAMUSCULAR | Status: AC
  Administered 2020-11-11: 25 ug via INTRAVENOUS
  Filled 2020-11-11: qty 2

## 2020-11-11 MED ORDER — EPHEDRINE SULFATE 50 MG/ML IJ SOLN
INTRAMUSCULAR | Status: DC | PRN
  Administered 2020-11-11: 10 mg via INTRAVENOUS

## 2020-11-11 MED ORDER — DOCUSATE SODIUM 100 MG PO CAPS
100.0000 mg | ORAL_CAPSULE | Freq: Two times a day (BID) | ORAL | Status: DC
  Administered 2020-11-11 – 2020-11-16 (×10): 100 mg via ORAL
  Filled 2020-11-11 (×10): qty 1

## 2020-11-11 MED ORDER — FENTANYL CITRATE (PF) 100 MCG/2ML IJ SOLN
INTRAMUSCULAR | Status: AC
  Filled 2020-11-11: qty 2

## 2020-11-11 MED ORDER — NITROGLYCERIN 0.4 MG SL SUBL: 0.4000 mg | SUBLINGUAL_TABLET | SUBLINGUAL | Status: DC | PRN

## 2020-11-11 MED ORDER — MAGNESIUM HYDROXIDE 400 MG/5ML PO SUSP
30.0000 mL | Freq: Every day | ORAL | Status: DC | PRN
  Administered 2020-11-14 – 2020-11-15 (×2): 30 mL via ORAL
  Filled 2020-11-11 (×2): qty 30

## 2020-11-11 MED ORDER — PROPOFOL 10 MG/ML IV BOLUS
INTRAVENOUS | Status: AC
  Filled 2020-11-11: qty 20

## 2020-11-11 SURGICAL SUPPLY — 7 items
KIT TURNOVER KIT A (KITS) ×2 IMPLANT
NDL HPO THNWL 1X22GA REG BVL (NEEDLE) ×1 IMPLANT
NDL SAFETY ECLIPSE 18X1.5 (NEEDLE) ×1 IMPLANT
NEEDLE HYPO 18GX1.5 SHARP (NEEDLE) ×2
NEEDLE SAFETY 22GX1 (NEEDLE) ×2
SLING ULTRA II LG (MISCELLANEOUS) ×2 IMPLANT
SYR 10ML LL (SYRINGE) ×2 IMPLANT

## 2020-11-11 NOTE — Anesthesia Preprocedure Evaluation (Addendum)
Anesthesia Evaluation  Patient identified by MRN, date of birth, ID band Patient awake    Reviewed: Allergy & Precautions, NPO status , Patient's Chart, lab work & pertinent test results, reviewed documented beta blocker date and time   Airway Mallampati: III  TM Distance: >3 FB     Dental  (+) Chipped, Poor Dentition,    Pulmonary asthma , sleep apnea , COPD,    Pulmonary exam normal        Cardiovascular Exercise Tolerance: Poor METS: < 3 Mets hypertension, Pt. on medications and Pt. on home beta blockers + CAD and +CHF (chronic diastolic heart failure)  + dysrhythmias Atrial Fibrillation + Valvular Problems/Murmurs MR  Rhythm:Regular Rate:Bradycardia  ECHO and Heart Cath 05/2020:  Dist LAD lesion is 30% stenosed.  Ost LAD to Mid LAD lesion is 40% stenosed.   1.  No evidence of obstructive coronary artery disease.  Relatively stable mild to moderate proximal LAD disease. 2.  Left ventricular angiography was not performed.  EF is normal by echo. 3.  Mildly elevated left ventricular end-diastolic pressure.    Neuro/Psych  Headaches (controlled), Anxiety Chronic Pain Syndrome     GI/Hepatic negative GI ROS, Neg liver ROS,   Endo/Other  Morbid obesity  Renal/GU CRFRenal disease     Musculoskeletal  (+) Arthritis , Rheumatoid disorders,  DJD (degenerative joint disease) of cervical spine OA (osteoarthritis) Back pain Metatarsalgia of right foot  Polymyalgia rheumatica   Right shoulder dislocation   Abdominal (+) + obese,   Peds  Hematology  (+) anemia ,   Anesthesia Other Findings 05/29/2020: IMPRESSIONS    1. Left ventricular ejection fraction, by estimation, is 60 to 65%. The  left ventricle has normal function. The left ventricle has no regional  wall motion abnormalities. There is mild left ventricular hypertrophy.  Left ventricular diastolic parameters  are consistent with Grade I diastolic  dysfunction (impaired relaxation).  2. Right ventricular systolic function is normal. The right ventricular  size is normal. Tricuspid regurgitation signal is inadequate for assessing  PA pressure.  3. Left atrial size was moderately dilated.   Reproductive/Obstetrics                          Anesthesia Physical  Anesthesia Plan  ASA: III  Anesthesia Plan: General   Post-op Pain Management:    Induction: Intravenous  PONV Risk Score and Plan: Treatment may vary due to age or medical condition and Ondansetron  Airway Management Planned: Oral ETT  Additional Equipment:   Intra-op Plan:   Post-operative Plan: Extubation in OR  Informed Consent: I have reviewed the patients History and Physical, chart, labs and discussed the procedure including the risks, benefits and alternatives for the proposed anesthesia with the patient or authorized representative who has indicated his/her understanding and acceptance.     Dental advisory given  Plan Discussed with: CRNA and Anesthesiologist  Anesthesia Plan Comments:       Anesthesia Quick Evaluation

## 2020-11-11 NOTE — Op Note (Signed)
11/11/2020  2:31 PM  Patient:   Tina Pacheco  Pre-Op Diagnosis:   Subacute closed anterior dislocation, right shoulder.  Post-Op Diagnosis:   Same  Procedure:   Closed reduction of anterior dislocation and steroid injection, right shoulder.  Surgeon:   Pascal Lux, MD  Assistant:   Rennis Chris, PA-S  Anesthesia:   GET  Findings:   As above.  Complications:   None  Fluids:   300 cc crystalloid  EBL:   0 cc  UOP:   None  TT:   None  Drains:   None  Closure:   None  Brief Clinical Note:   The patient is a 70 year old orbitally obese woman with multiple medical problems who apparently injured her shoulder over 2 weeks ago.  She is not sure how she did it but wonders if it may have resulted from when she tried to wave to a friend while in a pool and felt a "shift" in her shoulder.  She did not seek treatment immediately, but finally presented to the emergency room last evening where x-rays demonstrated the above-noted injury.  An attempt at reduction in the emergency room was unsuccessful.  She presents at this time for definitive management of her injury.  Procedure:   The patient was brought into the operating room and laid in the supine position.  After adequate general endotracheal intubation and anesthesia were obtained, a timeout was performed to verify the appropriate surgical site.    Using traction/countertraction, the shoulder was reduced.  The adequacy of reduction was verified using C arm imaging in multiple projections as well as under fluoroscopic imaging.  Using sterile technique, a total of 1 cc of Kenalog-40 (40 mg) and 9 cc of 0.5% plain Sensorcaine was injected into the glenohumeral joint to help with postoperative discomfort before the patient was placed into a shoulder immobilizer.  The patient was then awakened, extubated, and returned to the recovery room in satisfactory condition after tolerating the procedure well.

## 2020-11-11 NOTE — Progress Notes (Signed)
Pt admitted to room 159. Pt oriented to room, VSS, call bell in reach.   11/11/20 1621  Vitals  Temp 97.8 F (36.6 C)  BP (!) 153/65  MAP (mmHg) 89  BP Location Left Wrist  BP Method Automatic  Patient Position (if appropriate) Sitting  Pulse Rate 63  Pulse Rate Source Monitor  Resp 18  MEWS COLOR  MEWS Score Color Green  Oxygen Therapy  SpO2 94 %  O2 Device Room [employer]

## 2020-11-11 NOTE — Anesthesia Procedure Notes (Signed)
Procedure Name: Intubation Date/Time: 11/11/2020 1:20 PM Performed by: Fredderick Phenix, CRNA Pre-anesthesia Checklist: Patient identified, Emergency Drugs available, Suction available and Patient being monitored Patient Re-evaluated:Patient Re-evaluated prior to induction Oxygen Delivery Method: Circle system utilized Preoxygenation: Pre-oxygenation with 100% oxygen Induction Type: IV induction Ventilation: Mask ventilation without difficulty Laryngoscope Size: McGraph and 4 Grade View: Grade I Tube type: Oral Tube size: 7.0 mm Number of attempts: 1 Airway Equipment and Method: Stylet and Oral airway Placement Confirmation: ETT inserted through vocal cords under direct vision, positive ETCO2 and breath sounds checked- equal and bilateral Secured at: 21 cm Tube secured with: Tape Dental Injury: Teeth and Oropharynx as per pre-operative assessment

## 2020-11-11 NOTE — Progress Notes (Signed)
  Chaplain On-Call responded to Order Requisition for patient "to create or update Advance Directive".  Chaplain met the patient who was recovering from shoulder surgery today.  The patient stated her desire to discuss the Advance Directive documents with her daughter. This Chaplain provided the documents and brief education with the patient.  Chaplain will ask the Morning Chaplain to follow-up tomorrow, September 16.  Chaplain Pollyann Samples M.Div., [facility]

## 2020-11-11 NOTE — Consult Note (Signed)
ORTHOPAEDIC CONSULTATION  REQUESTING PHYSICIAN: Athena Masse, MD  Chief Complaint:   Right shoulder pain  History of Present Illness: ASHALEY Pacheco is a 70 y.o. female with multiple medical problems including hypertension, COPD, sleep apnea, coronary artery disease, paroxysmal atrial fibrillation for which she is on Pradaxa, gout, and morbid obesity who lives independently.  The patient describes a several week history of right shoulder pain associated with difficulty moving her arm.  She does not recall any specific injury, other than feeling a shifting sensation in her shoulder when reaching up to wave to somebody while in her pool.  However, despite the fact that she was unable to lift her arm or to move her arm without significant pain, she did not seek treatment immediately.  Because of continued symptoms, she presented to [facility]'s urgent care facility last evening where x-rays demonstrated an anteriorly dislocated right shoulder.  Therefore, the patient was sent to the emergency room where an attempted reduction under IV sedation was unsuccessful.  The patient subsequently was admitted for pain control and definitive treatment of her injury.  The patient notes that she has had discomfort in her right shoulder previously but has not sought definitive treatment.  She describes the pain is radiating up into her neck as well as down her arm to her hand, but denies any true numbness or paresthesias to her hand.  Past Medical History:  Diagnosis Date   Arthritis    RA   Asthma    Basal cell carcinoma    Cataract 2018   bilateral eyes; corrected with surgery   Claustrophobia    Collagen vascular disease ([place])    RA   COPD (chronic obstructive pulmonary disease) (HCC)    Diastolic dysfunction    a. echo 07/2014: EF 55-60%, no RWMA, GR2DD, mild MR, LA moderately dilated, PASP 38 mm Hg   Dyslipidemia    Headache     migraines   Hemihypertrophy    History of cardiac cath    a. cardiac cath 05/24/2010 - nonobstructive CAD   History of gout    Hyperplastic colonic polyp 2003   Hypertension    Hypokalemia    Morbid obesity ([place])    PAF (paroxysmal atrial fibrillation) ([place])    a. on Pradaxa; b. CHADSVASc at least 2 (HTN & female)   Rheumatoid arthritis(714.0)    Sleep apnea    a. not compliant with CPAP   Past Surgical History:  Procedure Laterality Date   ABDOMINAL HYSTERECTOMY     APPLICATION OF WOUND VAC Right 08/09/2017   Procedure: APPLICATION OF WOUND VAC;  Surgeon: Albertine Patricia, DPM;  Location: [facility];  Service: Podiatry;  Laterality: Right;   CARDIAC CATHETERIZATION  05/24/2010   nonobstructive CAD   CATARACT EXTRACTION W/ INTRAOCULAR LENS IMPLANT Right 06/12/2016   Dr. Darleen Crocker   CATARACT EXTRACTION W/ INTRAOCULAR LENS IMPLANT Left 06/26/2016   Dr. Darleen Crocker   CESAREAN SECTION     CHOLECYSTECTOMY     COLONOSCOPY  06/12/2011   Procedure: COLONOSCOPY;  Surgeon: Juanita Craver, MD;  Location: [facility];  Service: Endoscopy;  Laterality: N/A;   COLONOSCOPY N/A 03/17/2013   Procedure: COLONOSCOPY;  Surgeon: Juanita Craver, MD;  Location: [facility];  Service: Endoscopy;  Laterality: N/A;   EYE SURGERY     FOOT ARTHRODESIS Right 07/13/2017   Procedure: ARTHRODESIS FOOT-MULTI.FUSIONS (6 JOINTS);  Surgeon: Albertine Patricia, DPM;  Location: [facility];  Service: Podiatry;  Laterality: Right;   IRRIGATION AND DEBRIDEMENT FOOT  Right 08/09/2017   Procedure: IRRIGATION AND DEBRIDEMENT FOOT;  Surgeon: Albertine Patricia, DPM;  Location: [facility];  Service: Podiatry;  Laterality: Right;   KNEE ARTHROSCOPY     bilateral   LEFT HEART CATH AND CORONARY ANGIOGRAPHY N/A 06/04/2020   Procedure: LEFT HEART CATH AND CORONARY ANGIOGRAPHY;  Surgeon: Wellington Hampshire, MD;  Location: [place] [facility];  Service: Cardiovascular;  Laterality: N/A;   SINUSOTOMY     TOOTH EXTRACTION  12/2016   VAGINAL  HYSTERECTOMY     Social History   Socioeconomic History   Marital status: Married    Spouse name: Not on file   Number of children: 1   Years of education: Not on file   Highest education level: Not on file  Occupational History   Occupation: [place] service    Employer: [employer]    Comment: Retired 2007  Tobacco Use   Smoking status: Never   Smokeless tobacco: Never  Vaping Use   Vaping Use: Never used  Substance and Sexual Activity   Alcohol use: No   Drug use: No   Sexual activity: Not Currently  Other Topics Concern   Not on file  Social History Narrative   No living will   Husband, then daughter should be decision maker   Would accept resuscitation attempts   Not sure about tube feeds   Social Determinants of Health   Financial Resource Strain: Not on file  Food Insecurity: No Food Insecurity   Worried About Charity fundraiser in the Last Year: Never true   [place] in the Last Year: Never true  Transportation Needs: No Transportation Needs   Lack of Transportation (Medical): No   Lack of Transportation (Non-Medical): No  Physical Activity: Not on file  Stress: Not on file  Social Connections: Not on file   Family History  Problem Relation Age of Onset   Emphysema Father        smoked   Arthritis Father    Tuberculosis Mother    Parkinsonism Mother    Cancer Sister    Diabetes type II Sister    Breast cancer Sister    Breast cancer Maternal Aunt    Tuberculosis Sister    Allergies  Allergen Reactions   Fluoxetine Other (See Comments)    Headache, shaking, sleep issues Headache, shaking, sleep issues   Codeine     Nausea and vomiting/only when taking too much   Prior to Admission medications   Medication Sig Start Date End Date Taking? Authorizing Provider  amiodarone (PACERONE) 200 MG tablet Take 1 tablet (200 mg total) by mouth daily. Take one tab daily. 06/29/20  Yes Gollan, Kathlene November, MD  apixaban (ELIQUIS) 5 MG TABS tablet TAKE 1 TABLET(5  MG) BY MOUTH TWICE DAILY 10/13/20  Yes Gollan, Kathlene November, MD  atorvastatin (LIPITOR) 20 MG tablet TAKE 1 TABLET BY MOUTH DAILY AT 6 PM 01/25/20  Yes Venia Carbon, MD  Certolizumab Pegol [facility]) Inject into the skin every 30 (thirty) days.   Yes [provider]  gabapentin (NEURONTIN) 300 MG capsule Take 1 capsule (300 mg total) by mouth 4 (four) times daily. 01/20/20  Yes Viviana Simpler I, MD  ketoconazole (NIZORAL) 2 % cream APPLY A SMALL AMOUNT TO AFFECTED AREA ONCE A DAY TO RASH ON BUTTOCKS, GROIN 10/27/20  Yes Brendolyn Patty, MD  leflunomide (ARAVA) 20 MG tablet Take 20 mg by mouth daily.   Yes [provider]  metoprolol succinate (  TOPROL-XL) 50 MG 24 hr tablet TAKE 1 TABLET BY MOUTH EVERY DAY WITH OR IMMEDIATELY FOLLOWING A MEAL 01/25/20  Yes Viviana Simpler I, MD  montelukast (SINGULAIR) 10 MG tablet TAKE 1 TABLET BY MOUTH AT BEDTIME 02/02/20  Yes Venia Carbon, MD  Oxycodone HCl 20 MG TABS Take 1 tablet (20 mg total) by mouth in the morning and at bedtime. 10/31/20  Yes Venia Carbon, MD  Potassium Chloride ER 20 MEQ TBCR Take 20 mEq by mouth in the morning and at bedtime. 07/01/20  Yes Gollan, Kathlene November, MD  SF 5000 PLUS 1.1 % CREA dental cream Place 1 application onto teeth 2 (two) times daily. 06/01/17  Yes [provider]  spironolactone (ALDACTONE) 25 MG tablet TAKE 1 TABLET(25 MG) BY MOUTH DAILY 05/17/20  Yes Viviana Simpler I, MD  terazosin (HYTRIN) 10 MG capsule TAKE 1 CAPSULE(10 MG) BY MOUTH AT BEDTIME 01/03/20  Yes Viviana Simpler I, MD  torsemide (DEMADEX) 20 MG tablet Take 40 mg by mouth daily. Takes an extra tablet when feeling SOB or has noticeable edema   Yes [provider]  Crisaborole (EUCRISA) 2 % OINT Apply to aa's BID PRN flares. Patient not taking: No sig reported 02/10/20   Brendolyn Patty, MD  fluconazole (DIFLUCAN) 200 MG tablet Take 1 tablet (200 mg total) by mouth daily. For 7 days for 1 week Patient not taking: No sig  reported 10/19/20   Brendolyn Patty, MD  meclizine (ANTIVERT) 25 MG tablet TAKE 1 TABLET(25 MG) BY MOUTH THREE TIMES DAILY AS NEEDED FOR DIZZINESS 10/20/19   Venia Carbon, MD  metolazone (ZAROXOLYN) 2.5 MG tablet Take 2.5 mg by mouth daily as needed.    [provider]  mupirocin ointment (BACTROBAN) 2 % Apply 1 application topically 2 (two) times daily. Qd to burn wounds 03/22/20   Brendolyn Patty, MD  nitroGLYCERIN (NITROSTAT) 0.4 MG SL tablet Place 1 tablet (0.4 mg total) under the tongue every 5 (five) minutes as needed for chest pain. 06/05/20   Fritzi Mandes, MD  nystatin (MYCOSTATIN/NYSTOP) powder Apply 1 application topically 3 (three) times daily as needed.    [provider]  polyethylene glycol (MIRALAX / GLYCOLAX) packet Take 17 g by mouth daily as needed for mild constipation. 07/16/17   Henreitta Leber, MD  predniSONE (DELTASONE) 5 MG tablet Take 15-20 mg by mouth every morning. Patient not taking: No sig reported 10/27/20   [provider]   DG Shoulder Right  Result Date: 11/10/2020 CLINICAL DATA:  dislocation EXAM: RIGHT SHOULDER - 2+ VIEW COMPARISON:  Shoulder x-ray dated September 23, 2018; chest x-ray dated May 28, 2020 FINDINGS: Right anterior shoulder dislocation. No definite fracture. Mild degenerative changes of the acromioclavicular joint. Mild interstitial opacities of the visualized lung, similar to prior chest x-ray. IMPRESSION: Right anterior shoulder dislocation. No definite fracture, although evaluation is limited due to overlying soft tissues. Correlate with shoulder CT. Electronically Signed   By: Yetta Glassman M.D.   On: 11/10/2020 19:55   CT Shoulder Right Wo Contrast  Result Date: 11/10/2020 CLINICAL DATA:  Right shoulder dislocation. EXAM: CT OF THE UPPER RIGHT EXTREMITY WITHOUT CONTRAST TECHNIQUE: Multidetector CT imaging of the upper right extremity was performed according to the standard protocol. COMPARISON:  Right shoulder x-rays from same  day. FINDINGS: Bones/Joint/Cartilage Anterior dislocation of the humeral head with respect to the glenoid. Impaction fracture of the posterosuperior humeral head (series 6, image 46). The glenoid appears intact. Moderate acromioclavicular osteoarthritis. No joint  effusion. Large amount of fluid in the subacromial/subdeltoid bursa. Ligaments Ligaments are suboptimally evaluated by CT. Muscles and Tendons Grossly intact.  No muscle atrophy. Soft tissue No fluid collection or hematoma.  No soft tissue mass. IMPRESSION: 1. Anterior shoulder dislocation with impaction fracture of the posterosuperior humeral head. 2. Severe subacromial/subdeltoid bursitis. Electronically Signed   By: Titus Dubin M.D.   On: 11/10/2020 20:18    Positive ROS: All other systems have been reviewed and were otherwise negative with the exception of those mentioned in the HPI and as above.  Physical Exam: General:  Alert, no acute distress Psychiatric:  Patient is competent for consent with normal mood and affect   Cardiovascular:  No pedal edema Respiratory:  No wheezing, non-labored breathing GI:  Abdomen is soft and non-tender Skin:  No lesions in the area of chief complaint Neurologic:  Sensation intact distally Lymphatic:  No axillary or cervical lymphadenopathy  Orthopedic Exam:  Orthopedic examination is limited to the right shoulder and upper extremity.  Skin inspection around the right shoulder is notable for some mild asymmetry as compared to the left, but otherwise is unremarkable.  No swelling, erythema, ecchymosis, abrasions, or other skin abnormalities are identified.  She has mild tenderness to palpation over the anterior lateral aspects of the shoulder.  She has more severe pain with any attempted active or passive motion of the shoulder.  She is neurovascularly intact to the right upper extremity and hand, including intact sensation to light touch over the axillary nerve and musculocutaneous nerve  distributions.  X-rays:  Recent x-rays and a CT scan of the right shoulder are available for review and have been reviewed by myself.  These films demonstrate an anteriorly dislocated humeral head which appears to be perched on the anterior glenoid rim.  There is a medium sized Hill-Sachs lesion on the posterior aspect of the humeral head.  No significant degenerative changes are identified.  No obvious muscle atrophy is noted.  Assessment: Subacute anterior dislocation right shoulder.  Plan: The treatment options have been discussed with the patient, including both surgical and nonsurgical choices.  The patient would like to proceed with surgical intervention to include a closed reduction, possible open reduction of the right glenohumeral joint.  The risks (including bleeding, infection, nerve and/or blood vessel injury, persistent or recurrent pain, loosening or failure of the components, leg length inequality, dislocation, need for further surgery, blood clots, strokes, heart attacks or arrhythmias, pneumonia, etc.) and benefits of the surgical procedure were discussed.  The patient states her understanding and agrees to proceed.  She also understands that there may be some underlying rotator cuff tears or recurrent instability that may need to be addressed at a later date.  A formal written consent will be obtained by the nursing staff.  Thank you for asking me to participate in the care of this most pleasant yet unfortunate woman.  I will be happy to follow her with you.   Pascal Lux, MD  Beeper #:  [phone]  11/11/2020 12:26 PM

## 2020-11-11 NOTE — Transfer of Care (Signed)
Immediate Anesthesia Transfer of Care Note  Patient: Tina Pacheco  Procedure(s) Performed: CLOSED REDUCTION SHOULDER (Right: Shoulder)  Patient Location: PACU  Anesthesia Type:General  Level of Consciousness: awake and alert   Airway & Oxygen Therapy: Patient Spontanous Breathing and Patient connected to nasal cannula oxygen  Post-op Assessment: Report given to RN and Post -op Vital signs reviewed and stable  Post vital signs: Reviewed and stable  Last Vitals:  Vitals Value Taken Time  BP 129/91 11/11/20 1432  Temp    Pulse 66 11/11/20 1436  Resp 20 11/11/20 1436  SpO2 96 % 11/11/20 1436  Vitals shown include unvalidated device data.  Last Pain:  Vitals:   11/11/20 1153  TempSrc: Temporal  PainSc: 10-Worst pain ever         Complications: No notable events documented.

## 2020-11-11 NOTE — ED Notes (Signed)
Visitor at bedside.

## 2020-11-11 NOTE — Anesthesia Postprocedure Evaluation (Signed)
Anesthesia Post Note  Patient: Tina Pacheco  Procedure(s) Performed: CLOSED REDUCTION SHOULDER (Right: Shoulder)  Patient location during evaluation: PACU Anesthesia Type: General Level of consciousness: awake and alert Pain management: pain level controlled Vital Signs Assessment: post-procedure vital signs reviewed and stable Respiratory status: spontaneous breathing, nonlabored ventilation and respiratory function stable Cardiovascular status: blood pressure returned to baseline and stable Postop Assessment: no apparent nausea or vomiting Anesthetic complications: no   No notable events documented.   Last Vitals:  Vitals:   11/11/20 1515 11/11/20 1530  BP: 129/72 (!) 137/56  Pulse: 62 (!) 54  Resp: 14 19  Temp:    SpO2: 97% 95%    Last Pain:  Vitals:   11/11/20 1530  TempSrc:   PainSc: [place]

## 2020-11-11 NOTE — Progress Notes (Signed)
Progress Note    Tina Pacheco  [id number] DOB: Mar 10, 1950  DOA: 11/10/2020 PCP: Venia Carbon, MD      Brief Narrative:    Medical records reviewed and are as summarized below:  Tina Pacheco is a 70 y.o. female with medical history significant for Rheumatoid arthritis on Arava, CAD, paroxysmal A. fib on Eliquis, chronic diastolic heart failure, HTN, COPD, sleep apnea, morbid obesity with BMI of 50 who presents to the ED on referral from [facility], for management of right shoulder dislocation.      Assessment/Plan:   Principal Problem:   Closed dislocation of right shoulder Active Problems:   Long term current use of anticoagulant therapy   Morbid obesity with BMI of 50.0-59.9, adult (HCC)   HTN (hypertension)   CAD (coronary artery disease)   Rheumatoid arthritis (HCC)   Paroxysmal A-fib (HCC)   Sleep apnea   COPD (chronic obstructive pulmonary disease) (HCC)   Chronic dislocation of right shoulder    Body mass index is 51.35 kg/m.  (Morbid obesity)   Nontraumatic close dislocation of right shoulder, fracture of right posterior superior humeral head, severe subacromial/subdeltoid bursitis: Attempt to reduce dislocated shoulder in the emergency room was unsuccessful.  Plan for reduction of right shoulder dislocation in the OR today.  Continue analgesics as needed for pain.  Paroxysmal atrial fibrillation: Continue metoprolol and amiodarone  Chronic diastolic CHF: Compensated.  Continue diuretics  Rheumatoid arthritis: Continue Arava  Other comorbidities include hypertension, CAD, OSA   Diet Order     None          Consultants: Orthopedic surgeon  Procedures: Plan for reduction of shoulder right shoulder dislocation in the OR today    Medications:    [MAR Hold] enoxaparin (LOVENOX) injection  40 mg Subcutaneous Q24H   [MAR Hold] fentaNYL (SUBLIMAZE) injection  100 mcg Intravenous Once   [MAR Hold] fentaNYL (SUBLIMAZE)  injection  50 mcg Intravenous Once   [MAR Hold] midazolam  2 mg Intravenous Once   [MAR Hold] midazolam  4 mg Intravenous Once   Continuous Infusions:  acetaminophen     ceFAZolin     [MAR Hold]  ceFAZolin (ANCEF) IV Stopped (11/11/20 0800)     Anti-infectives (From admission, onward)    Start     Dose/Rate Route Frequency Ordered Stop   11/11/20 1203  ceFAZolin (ANCEF) 2-4 GM/100ML-% IVPB       Note to Pharmacy: Lyman Bishop   : cabinet override      11/11/20 1203 11/12/20 0014   11/11/20 0800  [MAR Hold]  ceFAZolin (ANCEF) IVPB 2g/100 mL premix        (MAR Hold since Thu 11/11/2020 at 1153.Hold Reason: Transfer to a Procedural area)   2 g 200 mL/hr over 30 Minutes Intravenous 30 min pre-op 11/10/20 2016                Family Communication/Anticipated D/C date and plan/Code Status   DVT prophylaxis: enoxaparin (LOVENOX) injection 40 mg Start: 11/12/20 1000     Code Status: Full Code  Family Communication: Plan discussed with her husband at the bedside Disposition Plan:    Status is: Observation  The patient will require care spanning > 2 midnights and should be moved to inpatient because: Ongoing active pain requiring inpatient pain management and Inpatient level of care appropriate due to severity of illness  Dispo: The patient is from: Home  Anticipated d/c is to: Home              Patient currently is not medically stable to d/c.   Difficult to place patient No           Subjective:   C/o pain in the right shoulder  Objective:    Vitals:   11/11/20 1510 11/11/20 1515 11/11/20 1530 11/11/20 1545  BP: 130/61 129/72 (!) 137/56 (!) 137/59  Pulse: (!) 56 62 (!) 54 (!) 57  Resp: '12 14 19 15  '$ Temp:      TempSrc:      SpO2: 94% 97% 95% 95%  Weight:      Height:       No data found.   Intake/Output Summary (Last 24 hours) at 11/11/2020 1554 Last data filed at 11/11/2020 1549 Gross per 24 hour  Intake 840 ml  Output 850 ml   Net -10 ml   Filed Weights   11/10/20 1700 11/11/20 1153  Weight: 131.5 kg 131.5 kg    Exam:  GEN: NAD SKIN: Erythematous areas on the right and left groin and suprapubic area EYES: EOMI ENT: MMM CV: RRR PULM: CTA B ABD: soft, obese, NT, +BS CNS: AAO x 3, non focal EXT: Trace bilateral leg edema, no erythema or tenderness MSK: Tenderness in the right shoulder but no erythema or significant swelling.  Right arm is immobilized in a sling.      Data Reviewed:   I have personally reviewed following labs and imaging studies:  Labs: Labs show the following:   Basic Metabolic Panel: Recent Labs  Lab 11/10/20 2012  NA 138  K 3.9  CL 103  CO2 25  GLUCOSE 88  BUN 28*  CREATININE 1.19*  CALCIUM 10.2   GFR Estimated Creatinine Clearance: 58.3 mL/min (A) (by C-G formula based on SCr of 1.19 mg/dL (H)). Liver Function Tests: No results for input(s): AST, ALT, ALKPHOS, BILITOT, PROT, ALBUMIN in the last 168 hours. No results for input(s): LIPASE, AMYLASE in the last 168 hours. No results for input(s): AMMONIA in the last 168 hours. Coagulation profile No results for input(s): INR, PROTIME in the last 168 hours.  CBC: Recent Labs  Lab 11/10/20 2012  WBC 7.0  NEUTROABS 4.2  HGB 12.7  HCT 39.4  MCV 94.3  PLT 165   Cardiac Enzymes: No results for input(s): CKTOTAL, CKMB, CKMBINDEX, TROPONINI in the last 168 hours. BNP (last 3 results) No results for input(s): PROBNP in the last 8760 hours. CBG: No results for input(s): GLUCAP in the last 168 hours. D-Dimer: No results for input(s): DDIMER in the last 72 hours. Hgb A1c: No results for input(s): HGBA1C in the last 72 hours. Lipid Profile: No results for input(s): CHOL, HDL, LDLCALC, TRIG, CHOLHDL, LDLDIRECT in the last 72 hours. Thyroid function studies: No results for input(s): TSH, T4TOTAL, T3FREE, THYROIDAB in the last 72 hours.  Invalid input(s): FREET3 Anemia work up: No results for input(s):  VITAMINB12, FOLATE, FERRITIN, TIBC, IRON, RETICCTPCT in the last 72 hours. Sepsis Labs: Recent Labs  Lab 11/10/20 2012  WBC 7.0    Microbiology Recent Results (from the past 240 hour(s))  Resp Panel by RT-PCR (Flu A&B, Covid) Nasopharyngeal Swab     Status: None   Collection Time: 11/10/20  7:49 PM   Specimen: Nasopharyngeal Swab; Nasopharyngeal(NP) swabs in vial transport medium  Result Value Ref Range Status   SARS Coronavirus 2 by RT PCR NEGATIVE NEGATIVE Final    Comment: (NOTE) SARS-CoV-2  target nucleic acids are NOT DETECTED.  The SARS-CoV-2 RNA is generally detectable in upper respiratory specimens during the acute phase of infection. The lowest concentration of SARS-CoV-2 viral copies this assay can detect is 138 copies/mL. A negative result does not preclude SARS-Cov-2 infection and should not be used as the sole basis for treatment or other patient management decisions. A negative result may occur with  improper specimen collection/handling, submission of specimen other than nasopharyngeal swab, presence of viral mutation(s) within the areas targeted by this assay, and inadequate number of viral copies(<138 copies/mL). A negative result must be combined with clinical observations, patient history, and epidemiological information. The expected result is Negative.  Fact Sheet for Patients:  EntrepreneurPulse.com.au  Fact Sheet for Healthcare Providers:  IncredibleEmployment.be  This test is no t yet approved or cleared by the [place] FDA and  has been authorized for detection and/or diagnosis of SARS-CoV-2 by FDA under an Emergency Use Authorization (EUA). This EUA will remain  in effect (meaning this test can be used) for the duration of the COVID-19 declaration under Section 564(b)(1) of the Act, 21 U.S.C.section 360bbb-3(b)(1), unless the authorization is terminated  or revoked sooner.       Influenza A by PCR NEGATIVE  NEGATIVE Final   Influenza B by PCR NEGATIVE NEGATIVE Final    Comment: (NOTE) The Xpert Xpress SARS-CoV-2/FLU/RSV plus assay is intended as an aid in the diagnosis of influenza from Nasopharyngeal swab specimens and should not be used as a sole basis for treatment. Nasal washings and aspirates are unacceptable for Xpert Xpress SARS-CoV-2/FLU/RSV testing.  Fact Sheet for Patients: EntrepreneurPulse.com.au  Fact Sheet for Healthcare Providers: IncredibleEmployment.be  This test is not yet approved or cleared by the [place] FDA and has been authorized for detection and/or diagnosis of SARS-CoV-2 by FDA under an Emergency Use Authorization (EUA). This EUA will remain in effect (meaning this test can be used) for the duration of the COVID-19 declaration under Section 564(b)(1) of the Act, 21 U.S.C. section 360bbb-3(b)(1), unless the authorization is terminated or revoked.  Performed at [facility], [address] [place], [place] [address]     Procedures and diagnostic studies:  DG Shoulder Right  Result Date: 11/10/2020 CLINICAL DATA:  dislocation EXAM: RIGHT SHOULDER - 2+ VIEW COMPARISON:  Shoulder x-ray dated September 23, 2018; chest x-ray dated May 28, 2020 FINDINGS: Right anterior shoulder dislocation. No definite fracture. Mild degenerative changes of the acromioclavicular joint. Mild interstitial opacities of the visualized lung, similar to prior chest x-ray. IMPRESSION: Right anterior shoulder dislocation. No definite fracture, although evaluation is limited due to overlying soft tissues. Correlate with shoulder CT. Electronically Signed   By: Yetta Glassman M.D.   On: 11/10/2020 19:55   CT Shoulder Right Wo Contrast  Result Date: 11/10/2020 CLINICAL DATA:  Right shoulder dislocation. EXAM: CT OF THE UPPER RIGHT EXTREMITY WITHOUT CONTRAST TECHNIQUE: Multidetector CT imaging of the upper right extremity was performed according  to the standard protocol. COMPARISON:  Right shoulder x-rays from same day. FINDINGS: Bones/Joint/Cartilage Anterior dislocation of the humeral head with respect to the glenoid. Impaction fracture of the posterosuperior humeral head (series 6, image 46). The glenoid appears intact. Moderate acromioclavicular osteoarthritis. No joint effusion. Large amount of fluid in the subacromial/subdeltoid bursa. Ligaments Ligaments are suboptimally evaluated by CT. Muscles and Tendons Grossly intact.  No muscle atrophy. Soft tissue No fluid collection or hematoma.  No soft tissue mass. IMPRESSION: 1. Anterior shoulder dislocation with impaction fracture of the posterosuperior  humeral head. 2. Severe subacromial/subdeltoid bursitis. Electronically Signed   By: Titus Dubin M.D.   On: 11/10/2020 20:18   DG Humerus Right  Result Date: 11/11/2020 CLINICAL DATA:  surgery EXAM: DG C-ARM 1-60 MIN; RIGHT HUMERUS - 2+ VIEW FLUOROSCOPY TIME:  Fluoroscopy Time:  33 seconds. Radiation Exposure Index (if provided by the fluoroscopic device): 5.65 mGy. Number of Acquired Spot Images: 2 COMPARISON:  11/10/2020. FINDINGS: Two C-arm fluoroscopic images were obtained intraoperatively and submitted for post operative interpretation. Alignment appears improved when compared to 11/10/2020 radiographs; however, limited assessment of alignment due to single provided projection. Please see the performing provider's procedural report for further detail. IMPRESSION: 1. Intraoperative fluoroscopy during close reduction of right shoulder dislocation. Alignment appears improved when compared to 11/10/2020 radiographs; however, limited assessment of alignment due to single provided projection. Dedicated shoulder radiographs could confirm reduction if clinically indicated. 2. Please see recent CT of the shoulder for characterization of impaction fracture. Electronically Signed   By: Margaretha Sheffield M.D.   On: 11/11/2020 14:46   DG C-Arm 1-60  Min  Result Date: 11/11/2020 CLINICAL DATA:  surgery EXAM: DG C-ARM 1-60 MIN; RIGHT HUMERUS - 2+ VIEW FLUOROSCOPY TIME:  Fluoroscopy Time:  33 seconds. Radiation Exposure Index (if provided by the fluoroscopic device): 5.65 mGy. Number of Acquired Spot Images: 2 COMPARISON:  11/10/2020. FINDINGS: Two C-arm fluoroscopic images were obtained intraoperatively and submitted for post operative interpretation. Alignment appears improved when compared to 11/10/2020 radiographs; however, limited assessment of alignment due to single provided projection. Please see the performing provider's procedural report for further detail. IMPRESSION: 1. Intraoperative fluoroscopy during close reduction of right shoulder dislocation. Alignment appears improved when compared to 11/10/2020 radiographs; however, limited assessment of alignment due to single provided projection. Dedicated shoulder radiographs could confirm reduction if clinically indicated. 2. Please see recent CT of the shoulder for characterization of impaction fracture. Electronically Signed   By: Margaretha Sheffield M.D.   On: 11/11/2020 14:46               LOS: 0 days   Johnnay Pleitez  [facility]   Pager on [email or web address]. If 7PM-7AM, please contact night-coverage at [email or web address]     11/11/2020, 3:54 PM

## 2020-11-11 NOTE — ED Notes (Signed)
External catheter in place. Pts brief changed and pt moved up in bed for comfort. Hospital bed ordered.

## 2020-11-12 ENCOUNTER — Encounter: Payer: Self-pay | Admitting: Surgery

## 2020-11-12 DIAGNOSIS — S43004A Unspecified dislocation of right shoulder joint, initial encounter: Secondary | ICD-10-CM | POA: Diagnosis not present

## 2020-11-12 DIAGNOSIS — L89152 Pressure ulcer of sacral region, stage 2: Secondary | ICD-10-CM | POA: Diagnosis present

## 2020-11-12 NOTE — Evaluation (Signed)
**Note Tina-Identified via Obfuscation** Physical Therapy Evaluation Patient Details Name: DEMII Pacheco MRN: [id number] DOB: 01-11-1951 Today's Date: 11/12/2020  History of Present Illness  70 y.o. female with medical history significant for Rheumatoid arthritis on Arava, CAD, paroxysmal A. fib on Eliquis, chronic diastolic heart failure, HTN, COPD, sleep apnea, morbid obesity with BMI of 50 who presents to the ED on referral from [facility], for management of right shoulder dislocation.  Clinical Impression  Pt received supine in bed, agreeable to therapy. Very thorough historian. She reports living in a single-level home, ramped entrance, with her husband. She states her husband is not in great health and is unable to provide physical assistance at home. A caregiver comes to the house for 3 hours, 3 days/week. She uses Rollator at home and is able to walk about 81f at a time. AD do not fit through narrow bathroom doorways. Mod I with gait and transfers, needed assist with ADLs and IADLs.   Pt performed bed mobility and STS with CGA for safety and steadying. Pt refused to use AD stating she does not trust them. She also stated she does not trust anyone to hold onto her to prevent a fall. Forward ambulation was not performed as she is not able to use Rollator due to NWB status of RUE and pt lack of trust in AD and ability to steady. Lateral stepping required increased time with VC on sequencing and foot clearance. R shoulder pain increased with all mobility. MD entered room midway through session; pt provided thorough subjective information to MD as well. Due to lack of physical assistance at home, PT rec SNF for safety due to impaired balance and inability to utilize RUE to steady. Would benefit from skilled PT to address above deficits and promote optimal return to PLOF.      Recommendations for follow up therapy are one component of a multi-disciplinary discharge planning process, led by the attending physician.  Recommendations may be  updated based on patient status, additional functional criteria and insurance authorization.  Follow Up Recommendations      Equipment Recommendations       Recommendations for Other Services       Precautions / Restrictions Precautions Precautions: Fall Required Braces or Orthoses: Sling Restrictions Other Position/Activity Restrictions: Per Dr. PRoland Rack keep the sling on as much as possible for the next several weeks, removing it for hygiene purposes only      Mobility  Bed Mobility Overal bed mobility: Needs Assistance Bed Mobility: Supine to Sit;Sit to Supine     Supine to sit: Min guard;HOB elevated Sit to supine: Min guard   General bed mobility comments: for safety as pt was moving towards EOB due to short stature    Transfers Overall transfer level: Needs assistance   Transfers: Sit to/from Stand Sit to Stand: Min guard         General transfer comment: pt pulled herself to standing using sink countertop and LUE only. CGA to steady and for safety. RUE remained immobilized. Pt refused AD offered by PT stating she does not trust AD.  Ambulation/Gait             General Gait Details: 1 foot lateral steps with significantly increased time to complete, forward trunk lean as pt would not release counter top. Decreased foot clearance bilaterally with shuffling appearance. Pt refused AD offered. Decreased awareness regarding safety with mobility and alignment with sitting surface.  Stairs            WEmergency planning/management officer  Modified Rankin (Stroke Patients Only)       Balance Overall balance assessment: Needs assistance Sitting-balance support: Single extremity supported Sitting balance-Leahy Scale: Fair     Standing balance support: Single extremity supported Standing balance-Leahy Scale: Poor Standing balance comment: required LUE support on counter top during static and dynamic standing                             Pertinent  Vitals/Pain      Home Living Family/patient expects to be discharged to:: Private residence Living Arrangements: Spouse/significant other Available Help at Discharge: Family (minimal assist by husband due to poor health) Type of Home: House Home Access: Ramped entrance     Home Layout: One level Home Equipment: Walker - 4 wheels;Cane - single point;Bedside commode Additional Comments: uses rollator at baseline, is able to ambulate ~65f. does not use AD in bathroom as it will not fit. Pt holds onto sink counter, grab bars and tub for stability.    Prior Function Level of Independence: Needs assistance   Gait / Transfers Assistance Needed: Mod I  ADL's / Homemaking Assistance Needed: Assist with showering, full body dressing (pt can partially dress herself), cooking, cleaning.  Comments: Pt reports poor balance, 2 falls over the past 6 months due to LOB. She has caregivers out to the house for 3 hours 3x/week.     Hand Dominance   Dominant Hand: Right    Extremity/Trunk Assessment   Upper Extremity Assessment Upper Extremity Assessment: RUE deficits/detail RUE Deficits / Details: unable to assess ROM/strength due to recent dislocation. Sensation intact    Lower Extremity Assessment Lower Extremity Assessment: Generalized weakness    Cervical / Trunk Assessment Cervical / Trunk Assessment: Kyphotic  Communication   Communication: No difficulties  Cognition Arousal/Alertness: Awake/alert Behavior During Therapy: WFL for tasks assessed/performed Overall Cognitive Status: Within Functional Limits for tasks assessed                                 General Comments: A&Ox4      General Comments      Exercises     Assessment/Plan    PT Assessment    PT Problem List         PT Treatment Interventions      PT Goals (Current goals can be found in the Care Plan section)  Acute Rehab PT Goals Patient Stated Goal: to not go to a nursing home PT Goal  Formulation: With patient Time For Goal Achievement: 11/26/20 Potential to Achieve Goals: Fair    Frequency     Barriers to discharge        Co-evaluation               AM-PAC PT "6 Clicks" Mobility  Outcome Measure                  End of Session              Time: [id number] Time Calculation (min) (ACUTE ONLY): 55 min   Charges:   PT Evaluation $PT Eval Moderate Complexity: 1 Mod PT Treatments $Therapeutic Activity: 38-52 mins       Tina LeveringPT, DPT 11/12/20 3:11 PM [id number]  KR[facility] Lab9/16/2022, 3:00 PM

## 2020-11-12 NOTE — Progress Notes (Addendum)
Progress Note    Tina Pacheco  [id number] DOB: 04/09/1950  DOA: 11/10/2020 PCP: Venia Carbon, MD      Brief Narrative:    Medical records reviewed and are as summarized below:  Tina Pacheco is a 70 y.o. female with medical history significant for Rheumatoid arthritis on Arava, CAD, paroxysmal A. fib on Eliquis, chronic diastolic heart failure, HTN, COPD, sleep apnea, morbid obesity with BMI of 50 who presents to the ED on referral from [facility], for management of right shoulder dislocation.      Assessment/Plan:   Principal Problem:   Closed dislocation of right shoulder Active Problems:   Long term current use of anticoagulant therapy   Morbid obesity with BMI of 50.0-59.9, adult (HCC)   HTN (hypertension)   CAD (coronary artery disease)   Rheumatoid arthritis (HCC)   Paroxysmal A-fib (HCC)   Sleep apnea   COPD (chronic obstructive pulmonary disease) (HCC)   Decubitus ulcer of sacral region, stage 2 (HCC)    Body mass index is 51.35 kg/m.  (Morbid obesity)   Nontraumatic close dislocation of right shoulder, fracture of right posterior superior humeral head, severe subacromial/subdeltoid bursitis: S/p reduction of right shoulder dislocation in the OR on 11/11/2020.  Continue analgesics as needed for pain.  PT and OT evaluation.  Paroxysmal atrial fibrillation: Continue Eliquis, metoprolol and amiodarone  Chronic diastolic CHF: Compensated.  Continue diuretics  Rheumatoid arthritis: Continue Arava  Other comorbidities include hypertension, CAD, OSA  Possible discharge to home tomorrow.   Diet Order             Diet Heart Room service appropriate? Yes; Fluid consistency: Thin  Diet effective now                      Consultants: Orthopedic surgeon  Procedures: S/p reduction of shoulder right shoulder dislocation in the OR on 11/11/2020   Medications:    amiodarone  200 mg Oral Daily   apixaban  5 mg Oral BID    atorvastatin  20 mg Oral PC supper   docusate sodium  100 mg Oral BID   gabapentin  300 mg Oral QID   influenza vaccine adjuvanted  0.5 mL Intramuscular Tomorrow-1000   leflunomide  20 mg Oral Daily   metoprolol succinate  50 mg Oral Daily   montelukast  10 mg Oral QHS   oxyCODONE  20 mg Oral BID   potassium chloride SA  20 mEq Oral Q breakfast   spironolactone  25 mg Oral Daily   terazosin  10 mg Oral QHS   torsemide  40 mg Oral Daily   Continuous Infusions:     Anti-infectives (From admission, onward)    Start     Dose/Rate Route Frequency Ordered Stop   11/11/20 1203  ceFAZolin (ANCEF) 2-4 GM/100ML-% IVPB       Note to Pharmacy: Lyman Bishop   : cabinet override      11/11/20 1203 11/12/20 0014   11/11/20 0800  ceFAZolin (ANCEF) IVPB 2g/100 mL premix  Status:  Discontinued        2 g 200 mL/hr over 30 Minutes Intravenous 30 min pre-op 11/10/20 2016 11/11/20 1703              Family Communication/Anticipated D/C date and plan/Code Status   DVT prophylaxis: Foot Pump / plexipulse Start: 11/11/20 1704 apixaban (ELIQUIS) tablet 5 mg     Code Status: Full Code  Family Communication: None Disposition  Plan:    Status is: Observation  The patient will require care spanning > 2 midnights and should be moved to inpatient because: Ongoing active pain requiring inpatient pain management and Inpatient level of care appropriate due to severity of illness  Dispo: The patient is from: Home              Anticipated d/c is to: Home              Patient currently is not medically stable to d/c.   Difficult to place patient No           Subjective:   Interval events noted.  She complains of severe right shoulder pain.  Objective:    Vitals:   11/12/20 0832 11/12/20 0848 11/12/20 1223 11/12/20 1359  BP:  108/89 (!) 126/58   Pulse: 61 (!) 50 (!) 49   Resp:  17 17   Temp:  97.7 F (36.5 C) 97.8 F (36.6 C)   TempSrc:  Oral Oral   SpO2:  98% 95% 97%   Weight:      Height:       No data found.   Intake/Output Summary (Last 24 hours) at 11/12/2020 1509 Last data filed at 11/11/2020 2000 Gross per 24 hour  Intake 640 ml  Output 500 ml  Net 140 ml   Filed Weights   11/10/20 1700 11/11/20 1153  Weight: 131.5 kg 131.5 kg    Exam:  GEN: NAD SKIN: Erythematous areas on the lower abdomen.  Stage II sacral decubitus ulcer (present on admission) EYES: No pallor or icterus ENT: MMM CV: RRR PULM: CTA B ABD: soft, obese, NT, +BS CNS: AAO x 3, non focal EXT: Right shoulder tenderness.  Right arm is immobilized in a sling.        Data Reviewed:   I have personally reviewed following labs and imaging studies:  Labs: Labs show the following:   Basic Metabolic Panel: Recent Labs  Lab 11/10/20 2012  NA 138  K 3.9  CL 103  CO2 25  GLUCOSE 88  BUN 28*  CREATININE 1.19*  CALCIUM 10.2   GFR Estimated Creatinine Clearance: 58.3 mL/min (A) (by C-G formula based on SCr of 1.19 mg/dL (H)). Liver Function Tests: No results for input(s): AST, ALT, ALKPHOS, BILITOT, PROT, ALBUMIN in the last 168 hours. No results for input(s): LIPASE, AMYLASE in the last 168 hours. No results for input(s): AMMONIA in the last 168 hours. Coagulation profile No results for input(s): INR, PROTIME in the last 168 hours.  CBC: Recent Labs  Lab 11/10/20 2012  WBC 7.0  NEUTROABS 4.2  HGB 12.7  HCT 39.4  MCV 94.3  PLT 165   Cardiac Enzymes: No results for input(s): CKTOTAL, CKMB, CKMBINDEX, TROPONINI in the last 168 hours. BNP (last 3 results) No results for input(s): PROBNP in the last 8760 hours. CBG: No results for input(s): GLUCAP in the last 168 hours. D-Dimer: No results for input(s): DDIMER in the last 72 hours. Hgb A1c: No results for input(s): HGBA1C in the last 72 hours. Lipid Profile: No results for input(s): CHOL, HDL, LDLCALC, TRIG, CHOLHDL, LDLDIRECT in the last 72 hours. Thyroid function studies: No results for  input(s): TSH, T4TOTAL, T3FREE, THYROIDAB in the last 72 hours.  Invalid input(s): FREET3 Anemia work up: No results for input(s): VITAMINB12, FOLATE, FERRITIN, TIBC, IRON, RETICCTPCT in the last 72 hours. Sepsis Labs: Recent Labs  Lab 11/10/20 2012  WBC 7.0    Microbiology Recent Results (  from the past 240 hour(s))  Resp Panel by RT-PCR (Flu A&B, Covid) Nasopharyngeal Swab     Status: None   Collection Time: 11/10/20  7:49 PM   Specimen: Nasopharyngeal Swab; Nasopharyngeal(NP) swabs in vial transport medium  Result Value Ref Range Status   SARS Coronavirus 2 by RT PCR NEGATIVE NEGATIVE Final    Comment: (NOTE) SARS-CoV-2 target nucleic acids are NOT DETECTED.  The SARS-CoV-2 RNA is generally detectable in upper respiratory specimens during the acute phase of infection. The lowest concentration of SARS-CoV-2 viral copies this assay can detect is 138 copies/mL. A negative result does not preclude SARS-Cov-2 infection and should not be used as the sole basis for treatment or other patient management decisions. A negative result may occur with  improper specimen collection/handling, submission of specimen other than nasopharyngeal swab, presence of viral mutation(s) within the areas targeted by this assay, and inadequate number of viral copies(<138 copies/mL). A negative result must be combined with clinical observations, patient history, and epidemiological information. The expected result is Negative.  Fact Sheet for Patients:  EntrepreneurPulse.com.au  Fact Sheet for Healthcare Providers:  IncredibleEmployment.be  This test is no t yet approved or cleared by the [place] FDA and  has been authorized for detection and/or diagnosis of SARS-CoV-2 by FDA under an Emergency Use Authorization (EUA). This EUA will remain  in effect (meaning this test can be used) for the duration of the COVID-19 declaration under Section 564(b)(1) of the  Act, 21 U.S.C.section 360bbb-3(b)(1), unless the authorization is terminated  or revoked sooner.       Influenza A by PCR NEGATIVE NEGATIVE Final   Influenza B by PCR NEGATIVE NEGATIVE Final    Comment: (NOTE) The Xpert Xpress SARS-CoV-2/FLU/RSV plus assay is intended as an aid in the diagnosis of influenza from Nasopharyngeal swab specimens and should not be used as a sole basis for treatment. Nasal washings and aspirates are unacceptable for Xpert Xpress SARS-CoV-2/FLU/RSV testing.  Fact Sheet for Patients: EntrepreneurPulse.com.au  Fact Sheet for Healthcare Providers: IncredibleEmployment.be  This test is not yet approved or cleared by the [place] FDA and has been authorized for detection and/or diagnosis of SARS-CoV-2 by FDA under an Emergency Use Authorization (EUA). This EUA will remain in effect (meaning this test can be used) for the duration of the COVID-19 declaration under Section 564(b)(1) of the Act, 21 U.S.C. section 360bbb-3(b)(1), unless the authorization is terminated or revoked.  Performed at [facility], [address] [place], [place] [address]     Procedures and diagnostic studies:  DG Shoulder Right  Result Date: 11/10/2020 CLINICAL DATA:  dislocation EXAM: RIGHT SHOULDER - 2+ VIEW COMPARISON:  Shoulder x-ray dated September 23, 2018; chest x-ray dated May 28, 2020 FINDINGS: Right anterior shoulder dislocation. No definite fracture. Mild degenerative changes of the acromioclavicular joint. Mild interstitial opacities of the visualized lung, similar to prior chest x-ray. IMPRESSION: Right anterior shoulder dislocation. No definite fracture, although evaluation is limited due to overlying soft tissues. Correlate with shoulder CT. Electronically Signed   By: Yetta Glassman M.D.   On: 11/10/2020 19:55   CT Shoulder Right Wo Contrast  Result Date: 11/10/2020 CLINICAL DATA:  Right shoulder dislocation. EXAM:  CT OF THE UPPER RIGHT EXTREMITY WITHOUT CONTRAST TECHNIQUE: Multidetector CT imaging of the upper right extremity was performed according to the standard protocol. COMPARISON:  Right shoulder x-rays from same day. FINDINGS: Bones/Joint/Cartilage Anterior dislocation of the humeral head with respect to the glenoid. Impaction fracture of the posterosuperior  humeral head (series 6, image 46). The glenoid appears intact. Moderate acromioclavicular osteoarthritis. No joint effusion. Large amount of fluid in the subacromial/subdeltoid bursa. Ligaments Ligaments are suboptimally evaluated by CT. Muscles and Tendons Grossly intact.  No muscle atrophy. Soft tissue No fluid collection or hematoma.  No soft tissue mass. IMPRESSION: 1. Anterior shoulder dislocation with impaction fracture of the posterosuperior humeral head. 2. Severe subacromial/subdeltoid bursitis. Electronically Signed   By: Titus Dubin M.D.   On: 11/10/2020 20:18   DG Humerus Right  Result Date: 11/11/2020 CLINICAL DATA:  surgery EXAM: DG C-ARM 1-60 MIN; RIGHT HUMERUS - 2+ VIEW FLUOROSCOPY TIME:  Fluoroscopy Time:  33 seconds. Radiation Exposure Index (if provided by the fluoroscopic device): 5.65 mGy. Number of Acquired Spot Images: 2 COMPARISON:  11/10/2020. FINDINGS: Two C-arm fluoroscopic images were obtained intraoperatively and submitted for post operative interpretation. Alignment appears improved when compared to 11/10/2020 radiographs; however, limited assessment of alignment due to single provided projection. Please see the performing provider's procedural report for further detail. IMPRESSION: 1. Intraoperative fluoroscopy during close reduction of right shoulder dislocation. Alignment appears improved when compared to 11/10/2020 radiographs; however, limited assessment of alignment due to single provided projection. Dedicated shoulder radiographs could confirm reduction if clinically indicated. 2. Please see recent CT of the shoulder for  characterization of impaction fracture. Electronically Signed   By: Margaretha Sheffield M.D.   On: 11/11/2020 14:46   DG C-Arm 1-60 Min  Result Date: 11/11/2020 CLINICAL DATA:  surgery EXAM: DG C-ARM 1-60 MIN; RIGHT HUMERUS - 2+ VIEW FLUOROSCOPY TIME:  Fluoroscopy Time:  33 seconds. Radiation Exposure Index (if provided by the fluoroscopic device): 5.65 mGy. Number of Acquired Spot Images: 2 COMPARISON:  11/10/2020. FINDINGS: Two C-arm fluoroscopic images were obtained intraoperatively and submitted for post operative interpretation. Alignment appears improved when compared to 11/10/2020 radiographs; however, limited assessment of alignment due to single provided projection. Please see the performing provider's procedural report for further detail. IMPRESSION: 1. Intraoperative fluoroscopy during close reduction of right shoulder dislocation. Alignment appears improved when compared to 11/10/2020 radiographs; however, limited assessment of alignment due to single provided projection. Dedicated shoulder radiographs could confirm reduction if clinically indicated. 2. Please see recent CT of the shoulder for characterization of impaction fracture. Electronically Signed   By: Margaretha Sheffield M.D.   On: 11/11/2020 14:46               LOS: 1 day   Haywood Meinders  [facility]   Pager on [email or web address]. If 7PM-7AM, please contact night-coverage at [email or web address]     11/12/2020, 3:09 PM

## 2020-11-12 NOTE — Evaluation (Signed)
Occupational Therapy Evaluation Patient Details Name: Tina Pacheco MRN: [id number] DOB: 18-Jul-1950 Today's Date: 11/12/2020   History of Present Illness 70 y.o. female with medical history significant for Rheumatoid arthritis on Arava, CAD, paroxysmal A. fib on Eliquis, chronic diastolic heart failure, HTN, COPD, sleep apnea, morbid obesity with BMI of 50 who presents to the ED on referral from [facility], for management of right shoulder dislocation.   Clinical Impression   Patient presenting with decreased I in self care, balance, functional mobility/transfers, endurance, and safety awareness. Patient reports living at home with husband and performing self care and functional mobility at mod I level PTA. Pt ambulates 10' at baseline. Pt reports her husband is unable to provide physical assistance at discharge.  Patient reports 10/10 pain in R shoulder and declined OOB activity. OT provided max -total A for repositioning in bed and total A to properly fix sling. OT again reviewed precautions with pt as she was noted to be attempting to lift R UE while in sling.  Patient will benefit from acute OT to increase overall independence in the areas of ADLs, functional mobility, safety awareness in order to safely discharge to next venue of care.      Recommendations for follow up therapy are one component of a multi-disciplinary discharge planning process, led by the attending physician.  Recommendations may be updated based on patient status, additional functional criteria and insurance authorization.   Follow Up Recommendations  SNF;Supervision/Assistance - 24 hour    Equipment Recommendations  Other (comment) (defer to next venue of care)       Precautions / Restrictions Precautions Precautions: Fall Required Braces or Orthoses: Sling Restrictions Weight Bearing Restrictions: Yes RUE Weight Bearing: Non weight bearing Other Position/Activity Restrictions: Per Dr. Roland Rack: keep the sling on  as much as possible for the next several weeks, removing it for hygiene purposes only      Mobility Bed Mobility Overal bed mobility: Needs Assistance Bed Mobility: Rolling Rolling: Max assist   Supine to sit: Min guard;HOB elevated Sit to supine: Min guard   General bed mobility comments: max A for repositioning    Transfers Overall transfer level: Needs assistance   Transfers: Sit to/from Stand Sit to Stand: Min guard         General transfer comment: pt declined secondary to pain    Balance Overall balance assessment: Needs assistance Sitting-balance support: Single extremity supported Sitting balance-Leahy Scale: Fair     Standing balance support: Single extremity supported Standing balance-Leahy Scale: Poor Standing balance comment: required LUE support on counter top during static and dynamic standing                           ADL either performed or assessed with clinical judgement   ADL Overall ADL's : Needs assistance/impaired                                       General ADL Comments: Pt able to perform grooming tasks with set up A and pt would likely need heavy max - total A for self care tasks secondary to pain this session.     Vision Patient Visual Report: No change from baseline              Pertinent Vitals/Pain Pain Assessment: Faces Faces Pain Scale: Hurts worst Pain Location: R shoulder Pain Descriptors / Indicators:  Aching;Discomfort;Grimacing Pain Intervention(s): Limited activity within patient's tolerance;Monitored during session;Premedicated before session;Repositioned     Hand Dominance Right   Extremity/Trunk Assessment Upper Extremity Assessment Upper Extremity Assessment: RUE deficits/detail RUE Deficits / Details: unable to assess ROM/strength due to recent dislocation. Sensation intact   Lower Extremity Assessment Lower Extremity Assessment: Generalized weakness   Cervical / Trunk  Assessment Cervical / Trunk Assessment: Kyphotic   Communication Communication Communication: No difficulties   Cognition Arousal/Alertness: Awake/alert Behavior During Therapy: WFL for tasks assessed/performed Overall Cognitive Status: Within Functional Limits for tasks assessed                                 General Comments: A&Ox4              Home Living Family/patient expects to be discharged to:: Private residence Living Arrangements: Spouse/significant other Available Help at Discharge: Family Type of Home: House Home Access: Ramped entrance     Home Layout: One level     Bathroom Shower/Tub: Tub/shower unit         Home Equipment: Environmental consultant - 4 wheels;Cane - single point;Bedside commode   Additional Comments: uses rollator at baseline, is able to ambulate ~85f. does not use AD in bathroom as it will not fit. Pt holds onto sink counter, grab bars and tub for stability.      Prior Functioning/Environment Level of Independence: Needs assistance  Gait / Transfers Assistance Needed: Mod I ADL's / Homemaking Assistance Needed: Assist with showering, full body dressing (pt can partially dress herself), cooking, cleaning.   Comments: Pt reports poor balance, 2 falls over the past 6 months due to LOB. She has caregivers out to the house for 3 hours 3x/week.        OT Problem List: Decreased strength;Decreased activity tolerance;Impaired balance (sitting and/or standing);Decreased safety awareness;Decreased knowledge of use of DME or AE;Impaired UE functional use;Obesity      OT Treatment/Interventions: Self-care/ADL training;Therapeutic exercise;Therapeutic activities;Cognitive remediation/compensation;Energy conservation;DME and/or AE instruction;Patient/family education;Balance training    OT Goals(Current goals can be found in the care plan section) Acute Rehab OT Goals Patient Stated Goal: to go home OT Goal Formulation: With patient Time For  Goal Achievement: 11/26/20 Potential to Achieve Goals: Good ADL Goals Pt Will Perform Grooming: with supervision;standing Pt Will Perform Upper Body Dressing: with min assist;sitting Pt Will Perform Lower Body Dressing: with min assist;sit to/from stand Pt Will Transfer to Toilet: with min assist;bedside commode Pt Will Perform Toileting - Clothing Manipulation and hygiene: with min assist;sit to/from stand  OT Frequency: Min 2X/week   Barriers to D/C: Decreased caregiver support  husband unable to assist at home          AM-PAC OT "6 Clicks" Daily Activity     Outcome Measure Help from another person eating meals?: A Little Help from another person taking care of personal grooming?: A Little Help from another person toileting, which includes using toliet, bedpan, or urinal?: Total Help from another person bathing (including washing, rinsing, drying)?: Total Help from another person to put on and taking off regular upper body clothing?: Total Help from another person to put on and taking off regular lower body clothing?: Total 6 Click Score: 10   End of Session Equipment Utilized During Treatment: Other (comment) (sling) Nurse Communication: Mobility status  Activity Tolerance: Patient tolerated treatment well Patient left: in bed;with call bell/phone within reach;with bed alarm set  OT Visit Diagnosis: Unsteadiness  on feet (R26.81);Muscle weakness (generalized) (M62.81)                Time: [id number] OT Time Calculation (min): 22 min Charges:  OT General Charges $OT Visit: 1 Visit OT Evaluation $OT Eval Moderate Complexity: 1 Mod OT Treatments $Therapeutic Activity: 8-22 mins  Darleen Crocker, MS, OTR/L , CBIS ascom [phone]  11/12/20, 4:31 PM

## 2020-11-13 ENCOUNTER — Inpatient Hospital Stay: Payer: Medicare Other

## 2020-11-13 DIAGNOSIS — S43004A Unspecified dislocation of right shoulder joint, initial encounter: Secondary | ICD-10-CM | POA: Diagnosis not present

## 2020-11-13 LAB — CBC WITH DIFFERENTIAL/PLATELET
Abs Immature Granulocytes: 0.03 10*3/uL (ref 0.00–0.07)
Basophils Absolute: 0 10*3/uL (ref 0.0–0.1)
Basophils Relative: 0 %
Eosinophils Absolute: 0 10*3/uL (ref 0.0–0.5)
Eosinophils Relative: 0 %
HCT: 38.4 % (ref 36.0–46.0)
Hemoglobin: 12.5 g/dL (ref 12.0–15.0)
Immature Granulocytes: 0 %
Lymphocytes Relative: 20 %
Lymphs Abs: 1.5 10*3/uL (ref 0.7–4.0)
MCH: 30.3 pg (ref 26.0–34.0)
MCHC: 32.6 g/dL (ref 30.0–36.0)
MCV: 93.2 fL (ref 80.0–100.0)
Monocytes Absolute: 0.6 10*3/uL (ref 0.1–1.0)
Monocytes Relative: 8 %
Neutro Abs: 5.1 10*3/uL (ref 1.7–7.7)
Neutrophils Relative %: 72 %
Platelets: 186 10*3/uL (ref 150–400)
RBC: 4.12 MIL/uL (ref 3.87–5.11)
RDW: 16.5 % — ABNORMAL HIGH (ref 11.5–15.5)
WBC: 7.3 10*3/uL (ref 4.0–10.5)
nRBC: 0 % (ref 0.0–0.2)

## 2020-11-13 LAB — LACTIC ACID, PLASMA: Lactic Acid, Venous: 1.9 mmol/L (ref 0.5–1.9)

## 2020-11-13 LAB — PROCALCITONIN: Procalcitonin: <0.1 ng/mL

## 2020-11-13 LAB — BRAIN NATRIURETIC PEPTIDE: B Natriuretic Peptide: 100.2 pg/mL — ABNORMAL HIGH (ref 0.0–100.0)

## 2020-11-13 MED ORDER — METHOCARBAMOL 500 MG PO TABS
500.0000 mg | ORAL_TABLET | Freq: Four times a day (QID) | ORAL | Status: DC | PRN
  Administered 2020-11-13 – 2020-11-16 (×3): 500 mg via ORAL
  Filled 2020-11-13 (×3): qty 1

## 2020-11-13 MED ORDER — BENZONATATE 100 MG PO CAPS
100.0000 mg | ORAL_CAPSULE | Freq: Three times a day (TID) | ORAL | Status: DC
  Administered 2020-11-13 – 2020-11-17 (×13): 100 mg via ORAL
  Filled 2020-11-13 (×13): qty 1

## 2020-11-13 NOTE — Progress Notes (Signed)
Progress Note    Tina Pacheco  [id number] DOB: [birth year]  DOA: 11/10/2020 PCP: Venia Carbon, MD      Brief Narrative:    Medical records reviewed and are as summarized below:  Tina Pacheco is a 70 y.o. female with medical history significant for Rheumatoid arthritis on Arava, CAD, paroxysmal A. fib on Eliquis, chronic diastolic heart failure, HTN, COPD, sleep apnea, morbid obesity with BMI of 50 who presents to the ED on referral from [facility], for management of right shoulder dislocation.      Assessment/Plan:   Principal Problem:   Closed dislocation of right shoulder Active Problems:   Long term current use of anticoagulant therapy   Morbid obesity with BMI of 50.0-59.9, adult (HCC)   HTN (hypertension)   CAD (coronary artery disease)   Rheumatoid arthritis (HCC)   Paroxysmal A-fib (HCC)   Sleep apnea   COPD (chronic obstructive pulmonary disease) (HCC)   Decubitus ulcer of sacral region, stage 2 (HCC)    Body mass index is 51.35 kg/m.  (Morbid obesity)   Nontraumatic close dislocation of right shoulder, fracture of right posterior superior humeral head, severe subacromial/subdeltoid bursitis: S/p reduction of right shoulder dislocation in the OR on 11/11/2020.  Continue analgesics as needed for pain.  PT and OT evaluation.  Cough, suspected left midlung pneumonia: Chest x-ray done today showed infiltrate in the left midlung.  CBC, blood cultures and procalcitonin level have been ordered.  Start Augmentin.  Paroxysmal atrial fibrillation: Continue Eliquis, metoprolol and amiodarone  Chronic diastolic CHF: Compensated.  Continue diuretics  Rheumatoid arthritis: Continue Arava  Other comorbidities include hypertension, CAD, OSA  Possible discharge to home tomorrow.   Diet Order             Diet Heart Room service appropriate? Yes; Fluid consistency: Thin  Diet effective now                      Consultants: Orthopedic  surgeon  Procedures: S/p reduction of shoulder right shoulder dislocation in the OR on 11/11/2020   Medications:    amiodarone  200 mg Oral Daily   apixaban  5 mg Oral BID   atorvastatin  20 mg Oral PC supper   benzonatate  100 mg Oral TID   docusate sodium  100 mg Oral BID   gabapentin  300 mg Oral QID   leflunomide  20 mg Oral Daily   metoprolol succinate  50 mg Oral Daily   montelukast  10 mg Oral QHS   oxyCODONE  20 mg Oral BID   potassium chloride SA  20 mEq Oral Q breakfast   spironolactone  25 mg Oral Daily   terazosin  10 mg Oral QHS   torsemide  40 mg Oral Daily   Continuous Infusions:     Anti-infectives (From admission, onward)    Start     Dose/Rate Route Frequency Ordered Stop   11/11/20 1203  ceFAZolin (ANCEF) 2-4 GM/100ML-% IVPB       Note to Pharmacy: Lyman Bishop   : cabinet override      11/11/20 1203 11/12/20 0014   11/11/20 0800  ceFAZolin (ANCEF) IVPB 2g/100 mL premix  Status:  Discontinued        2 g 200 mL/hr over 30 Minutes Intravenous 30 min pre-op 11/10/20 2016 11/11/20 1703              Family Communication/Anticipated D/C date and plan/Code Status  DVT prophylaxis: Foot Pump / plexipulse Start: 11/11/20 1704 apixaban (ELIQUIS) tablet 5 mg     Code Status: Full Code  Family Communication: None Disposition Plan:    Status is: Inpatient  Remains inpatient appropriate because:Unsafe d/c plan and Inpatient level of care appropriate due to severity of illness  Dispo: The patient is from: Home              Anticipated d/c is to: Home              Patient currently is not medically stable to d/c.   Difficult to place patient No                 Subjective:   C/o productive cough and pleuritic chest pain from coughing.  No shortness of breath, hemoptysis or fever.  Objective:    Vitals:   11/12/20 1744 11/12/20 2111 11/13/20 0515 11/13/20 0700  BP: (!) 159/69 (!) 151/52 (!) 162/63 (!) 158/69  Pulse: (!)  51 (!) 49 (!) 50 (!) 55  Resp: '16 18 20 20  '$ Temp: 98.3 F (36.8 C) 98.1 F (36.7 C) 98 F (36.7 C) 98.3 F (36.8 C)  TempSrc: Oral Oral Oral Oral  SpO2: 96% 96% 94% 96%  Weight:      Height:       No data found.   Intake/Output Summary (Last 24 hours) at 11/13/2020 1216 Last data filed at 11/13/2020 1138 Gross per 24 hour  Intake 240 ml  Output 1350 ml  Net -1110 ml   Filed Weights   11/10/20 1700 11/11/20 1153  Weight: 131.5 kg 131.5 kg    Exam:  GEN: NAD SKIN: Erythematous areas on the lower abdomen.  Stage II sacral decubitus ulcer. EYES: EOMI ENT: MMM CV: RRR PULM: No wheezing or rales heard ABD: soft, obese, NT, +BS CNS: AAO x 3, non focal EXT: Right shoulder tenderness.  Right arm is immobilized in a sling.         Data Reviewed:   I have personally reviewed following labs and imaging studies:  Labs: Labs show the following:   Basic Metabolic Panel: Recent Labs  Lab 11/10/20 2012  NA 138  K 3.9  CL 103  CO2 25  GLUCOSE 88  BUN 28*  CREATININE 1.19*  CALCIUM 10.2   GFR Estimated Creatinine Clearance: 58.3 mL/min (A) (by C-G formula based on SCr of 1.19 mg/dL (H)). Liver Function Tests: No results for input(s): AST, ALT, ALKPHOS, BILITOT, PROT, ALBUMIN in the last 168 hours. No results for input(s): LIPASE, AMYLASE in the last 168 hours. No results for input(s): AMMONIA in the last 168 hours. Coagulation profile No results for input(s): INR, PROTIME in the last 168 hours.  CBC: Recent Labs  Lab 11/10/20 2012  WBC 7.0  NEUTROABS 4.2  HGB 12.7  HCT 39.4  MCV 94.3  PLT 165   Cardiac Enzymes: No results for input(s): CKTOTAL, CKMB, CKMBINDEX, TROPONINI in the last 168 hours. BNP (last 3 results) No results for input(s): PROBNP in the last 8760 hours. CBG: No results for input(s): GLUCAP in the last 168 hours. D-Dimer: No results for input(s): DDIMER in the last 72 hours. Hgb A1c: No results for input(s): HGBA1C in the last  72 hours. Lipid Profile: No results for input(s): CHOL, HDL, LDLCALC, TRIG, CHOLHDL, LDLDIRECT in the last 72 hours. Thyroid function studies: No results for input(s): TSH, T4TOTAL, T3FREE, THYROIDAB in the last 72 hours.  Invalid input(s): FREET3 Anemia work up: No  results for input(s): VITAMINB12, FOLATE, FERRITIN, TIBC, IRON, RETICCTPCT in the last 72 hours. Sepsis Labs: Recent Labs  Lab 11/10/20 2012  WBC 7.0    Microbiology Recent Results (from the past 240 hour(s))  Resp Panel by RT-PCR (Flu A&B, Covid) Nasopharyngeal Swab     Status: None   Collection Time: 11/10/20  7:49 PM   Specimen: Nasopharyngeal Swab; Nasopharyngeal(NP) swabs in vial transport medium  Result Value Ref Range Status   SARS Coronavirus 2 by RT PCR NEGATIVE NEGATIVE Final    Comment: (NOTE) SARS-CoV-2 target nucleic acids are NOT DETECTED.  The SARS-CoV-2 RNA is generally detectable in upper respiratory specimens during the acute phase of infection. The lowest concentration of SARS-CoV-2 viral copies this assay can detect is 138 copies/mL. A negative result does not preclude SARS-Cov-2 infection and should not be used as the sole basis for treatment or other patient management decisions. A negative result may occur with  improper specimen collection/handling, submission of specimen other than nasopharyngeal swab, presence of viral mutation(s) within the areas targeted by this assay, and inadequate number of viral copies(<138 copies/mL). A negative result must be combined with clinical observations, patient history, and epidemiological information. The expected result is Negative.  Fact Sheet for Patients:  EntrepreneurPulse.com.au  Fact Sheet for Healthcare Providers:  IncredibleEmployment.be  This test is no t yet approved or cleared by the [place] FDA and  has been authorized for detection and/or diagnosis of SARS-CoV-2 by FDA under an Emergency Use  Authorization (EUA). This EUA will remain  in effect (meaning this test can be used) for the duration of the COVID-19 declaration under Section 564(b)(1) of the Act, 21 U.S.C.section 360bbb-3(b)(1), unless the authorization is terminated  or revoked sooner.       Influenza A by PCR NEGATIVE NEGATIVE Final   Influenza B by PCR NEGATIVE NEGATIVE Final    Comment: (NOTE) The Xpert Xpress SARS-CoV-2/FLU/RSV plus assay is intended as an aid in the diagnosis of influenza from Nasopharyngeal swab specimens and should not be used as a sole basis for treatment. Nasal washings and aspirates are unacceptable for Xpert Xpress SARS-CoV-2/FLU/RSV testing.  Fact Sheet for Patients: EntrepreneurPulse.com.au  Fact Sheet for Healthcare Providers: IncredibleEmployment.be  This test is not yet approved or cleared by the [place] FDA and has been authorized for detection and/or diagnosis of SARS-CoV-2 by FDA under an Emergency Use Authorization (EUA). This EUA will remain in effect (meaning this test can be used) for the duration of the COVID-19 declaration under Section 564(b)(1) of the Act, 21 U.S.C. section 360bbb-3(b)(1), unless the authorization is terminated or revoked.  Performed at [facility], [address], [place], [place] [address]     Procedures and diagnostic studies:  DG Chest [facility] 1 View  Result Date: 11/13/2020 CLINICAL DATA:  Cough. EXAM: PORTABLE CHEST 1 VIEW COMPARISON:  May 28, 2020 FINDINGS: Mild opacity in the left mid lung suspected. The heart, hila, mediastinum, and pleura are normal. No other acute abnormalities. IMPRESSION: Suspected mild opacity in left mid lung may represent subtle pneumonia. Recommend short-term follow-up imaging after treatment. Electronically Signed   By: Dorise Bullion III M.D.   On: 11/13/2020 11:31   DG Humerus Right  Result Date: 11/11/2020 CLINICAL DATA:  surgery EXAM: DG C-ARM 1-60 MIN;  RIGHT HUMERUS - 2+ VIEW FLUOROSCOPY TIME:  Fluoroscopy Time:  33 seconds. Radiation Exposure Index (if provided by the fluoroscopic device): 5.65 mGy. Number of Acquired Spot Images: 2 COMPARISON:  11/10/2020. FINDINGS: Two  C-arm fluoroscopic images were obtained intraoperatively and submitted for post operative interpretation. Alignment appears improved when compared to 11/10/2020 radiographs; however, limited assessment of alignment due to single provided projection. Please see the performing provider's procedural report for further detail. IMPRESSION: 1. Intraoperative fluoroscopy during close reduction of right shoulder dislocation. Alignment appears improved when compared to 11/10/2020 radiographs; however, limited assessment of alignment due to single provided projection. Dedicated shoulder radiographs could confirm reduction if clinically indicated. 2. Please see recent CT of the shoulder for characterization of impaction fracture. Electronically Signed   By: Margaretha Sheffield M.D.   On: 11/11/2020 14:46   DG C-Arm 1-60 Min  Result Date: 11/11/2020 CLINICAL DATA:  surgery EXAM: DG C-ARM 1-60 MIN; RIGHT HUMERUS - 2+ VIEW FLUOROSCOPY TIME:  Fluoroscopy Time:  33 seconds. Radiation Exposure Index (if provided by the fluoroscopic device): 5.65 mGy. Number of Acquired Spot Images: 2 COMPARISON:  11/10/2020. FINDINGS: Two C-arm fluoroscopic images were obtained intraoperatively and submitted for post operative interpretation. Alignment appears improved when compared to 11/10/2020 radiographs; however, limited assessment of alignment due to single provided projection. Please see the performing provider's procedural report for further detail. IMPRESSION: 1. Intraoperative fluoroscopy during close reduction of right shoulder dislocation. Alignment appears improved when compared to 11/10/2020 radiographs; however, limited assessment of alignment due to single provided projection. Dedicated shoulder radiographs  could confirm reduction if clinically indicated. 2. Please see recent CT of the shoulder for characterization of impaction fracture. Electronically Signed   By: Margaretha Sheffield M.D.   On: 11/11/2020 14:46               LOS: 2 days   Jep Dyas  [facility]   Pager on [email or web address]. If 7PM-7AM, please contact night-coverage at [email or web address]     11/13/2020, 12:16 PM

## 2020-11-13 NOTE — Plan of Care (Signed)
Patient rested well over night, no complaints. Patients vitals signs are stable, good output charted. Will continue to monitor.  Problem: Education: Goal: Knowledge of General Education information will improve Description: Including pain rating scale, medication(s)/side effects and non-pharmacologic comfort measures Outcome: Progressing   Problem: Health Behavior/Discharge Planning: Goal: Ability to manage health-related needs will improve Outcome: Progressing   Problem: Clinical Measurements: Goal: Ability to maintain clinical measurements within normal limits will improve Outcome: Progressing Goal: Will remain free from infection Outcome: Progressing Goal: Diagnostic test results will improve Outcome: Progressing Goal: Respiratory complications will improve Outcome: Progressing Goal: Cardiovascular complication will be avoided Outcome: Progressing

## 2020-11-14 DIAGNOSIS — S43004A Unspecified dislocation of right shoulder joint, initial encounter: Secondary | ICD-10-CM | POA: Diagnosis not present

## 2020-11-14 LAB — BASIC METABOLIC PANEL
Anion gap: 7 (ref 5–15)
BUN: 37 mg/dL — ABNORMAL HIGH (ref 8–23)
CO2: 31 mmol/L (ref 22–32)
Calcium: 9.7 mg/dL (ref 8.9–10.3)
Chloride: 99 mmol/L (ref 98–111)
Creatinine, Ser: 1.24 mg/dL — ABNORMAL HIGH (ref 0.44–1.00)
GFR, Estimated: 47 mL/min — ABNORMAL LOW (ref >60)
Glucose, Bld: 117 mg/dL — ABNORMAL HIGH (ref 70–99)
Potassium: 3.7 mmol/L (ref 3.5–5.1)
Sodium: 137 mmol/L (ref 135–145)

## 2020-11-14 LAB — PROCALCITONIN: Procalcitonin: <0.1 ng/mL

## 2020-11-14 MED ORDER — BENZOCAINE 10 % MT GEL
Freq: Three times a day (TID) | OROMUCOSAL | Status: DC | PRN
  Filled 2020-11-14: qty 9

## 2020-11-14 NOTE — Progress Notes (Signed)
Progress Note    Tina Pacheco  [id number] DOB: 1950-08-25  DOA: 11/10/2020 PCP: Venia Carbon, MD      Brief Narrative:    Medical records reviewed and are as summarized below:  Tina Pacheco is a 70 y.o. female with medical history significant for Rheumatoid arthritis on Arava, CAD, paroxysmal A. fib on Eliquis, chronic diastolic heart failure, HTN, COPD, sleep apnea, morbid obesity with BMI of 50 who presents to the ED on referral from [facility], for management of right shoulder dislocation.      Assessment/Plan:   Principal Problem:   Closed dislocation of right shoulder Active Problems:   Long term current use of anticoagulant therapy   Morbid obesity with BMI of 50.0-59.9, adult (HCC)   HTN (hypertension)   CAD (coronary artery disease)   Rheumatoid arthritis (HCC)   Paroxysmal A-fib (HCC)   Sleep apnea   COPD (chronic obstructive pulmonary disease) (HCC)   Decubitus ulcer of sacral region, stage 2 (HCC)    Body mass index is 51.35 kg/m.  (Morbid obesity)   Nontraumatic close dislocation of right shoulder, fracture of right posterior superior humeral head, severe subacromial/subdeltoid bursitis: S/p reduction of right shoulder dislocation in the OR on 11/11/2020.  Continue analgesics as needed for pain.  PT recommends discharge to SNF.  Cough, suspected left midlung pneumonia: Chest x-ray done on 11/13/2020 showed infiltrate in the left midlung.  Lactic acid and procalcitonin levels were normal.  Continue Augmentin.  Paroxysmal atrial fibrillation: Continue Eliquis, metoprolol and amiodarone  Chronic diastolic CHF: Compensated.  Continue diuretics  Rheumatoid arthritis: Continue Arava  Other comorbidities include hypertension, CAD, OSA  Awaiting placement to SNF   Diet Order             Diet Heart Room service appropriate? Yes; Fluid consistency: Thin  Diet effective now                      Consultants: Orthopedic  surgeon  Procedures: S/p reduction of shoulder right shoulder dislocation in the OR on 11/11/2020   Medications:    amiodarone  200 mg Oral Daily   apixaban  5 mg Oral BID   atorvastatin  20 mg Oral PC supper   benzonatate  100 mg Oral TID   docusate sodium  100 mg Oral BID   gabapentin  300 mg Oral QID   leflunomide  20 mg Oral Daily   metoprolol succinate  50 mg Oral Daily   montelukast  10 mg Oral QHS   oxyCODONE  20 mg Oral BID   potassium chloride SA  20 mEq Oral Q breakfast   spironolactone  25 mg Oral Daily   terazosin  10 mg Oral QHS   torsemide  40 mg Oral Daily   Continuous Infusions:     Anti-infectives (From admission, onward)    Start     Dose/Rate Route Frequency Ordered Stop   11/11/20 1203  ceFAZolin (ANCEF) 2-4 GM/100ML-% IVPB       Note to Pharmacy: Lyman Bishop   : cabinet override      11/11/20 1203 11/12/20 0014   11/11/20 0800  ceFAZolin (ANCEF) IVPB 2g/100 mL premix  Status:  Discontinued        2 g 200 mL/hr over 30 Minutes Intravenous 30 min pre-op 11/10/20 2016 11/11/20 1703              Family Communication/Anticipated D/C date and plan/Code Status   DVT  prophylaxis: Foot Pump / plexipulse Start: 11/11/20 1704 apixaban (ELIQUIS) tablet 5 mg     Code Status: Full Code  Family Communication: None Disposition Plan:    Status is: Inpatient  Remains inpatient appropriate because:Unsafe d/c plan and Inpatient level of care appropriate due to severity of illness  Dispo: The patient is from: Home              Anticipated d/c is to: Home              Patient currently is not medically stable to d/c.   Difficult to place patient No                 Subjective:   C/o right shoulder pain.  She still has a cough towards a little better today.  No shortness of breath or chest pain.  Objective:    Vitals:   11/13/20 2111 11/14/20 0356 11/14/20 0841 11/14/20 1150  BP: (!) 124/54 (!) 151/65 (!) 153/64 (!) 141/73   Pulse: (!) 51 (!) 48 (!) 47 (!) 51  Resp: '18 17 16 15  '$ Temp: 98.5 F (36.9 C) 98.1 F (36.7 C) (!) 97.5 F (36.4 C) 97.6 F (36.4 C)  TempSrc:   Oral   SpO2: 96% 93% 95% 93%  Weight:      Height:       No data found.   Intake/Output Summary (Last 24 hours) at 11/14/2020 1229 Last data filed at 11/14/2020 1146 Gross per 24 hour  Intake 480 ml  Output 1000 ml  Net -520 ml   Filed Weights   11/10/20 1700 11/11/20 1153  Weight: 131.5 kg 131.5 kg    Exam:  GEN: NAD SKIN: Erythematous areas on the lower abdomen EYES: No pallor or icterus ENT: MMM CV: RRR PULM: CTA B ABD: soft, obese, NT, +BS CNS: AAO x 3, non focal EXT: Right shoulder tenderness.  Right arm is immobilized in a sling.           Data Reviewed:   I have personally reviewed following labs and imaging studies:  Labs: Labs show the following:   Basic Metabolic Panel: Recent Labs  Lab 11/10/20 2012 11/14/20 0613  NA 138 137  K 3.9 3.7  CL 103 99  CO2 25 31  GLUCOSE 88 117*  BUN 28* 37*  CREATININE 1.19* 1.24*  CALCIUM 10.2 9.7   GFR Estimated Creatinine Clearance: 56 mL/min (A) (by C-G formula based on SCr of 1.24 mg/dL (H)). Liver Function Tests: No results for input(s): AST, ALT, ALKPHOS, BILITOT, PROT, ALBUMIN in the last 168 hours. No results for input(s): LIPASE, AMYLASE in the last 168 hours. No results for input(s): AMMONIA in the last 168 hours. Coagulation profile No results for input(s): INR, PROTIME in the last 168 hours.  CBC: Recent Labs  Lab 11/10/20 2012 11/13/20 1258  WBC 7.0 7.3  NEUTROABS 4.2 5.1  HGB 12.7 12.5  HCT 39.4 38.4  MCV 94.3 93.2  PLT 165 186   Cardiac Enzymes: No results for input(s): CKTOTAL, CKMB, CKMBINDEX, TROPONINI in the last 168 hours. BNP (last 3 results) No results for input(s): PROBNP in the last 8760 hours. CBG: No results for input(s): GLUCAP in the last 168 hours. D-Dimer: No results for input(s): DDIMER in the last 72  hours. Hgb A1c: No results for input(s): HGBA1C in the last 72 hours. Lipid Profile: No results for input(s): CHOL, HDL, LDLCALC, TRIG, CHOLHDL, LDLDIRECT in the last 72 hours. Thyroid function studies: No  results for input(s): TSH, T4TOTAL, T3FREE, THYROIDAB in the last 72 hours.  Invalid input(s): FREET3 Anemia work up: No results for input(s): VITAMINB12, FOLATE, FERRITIN, TIBC, IRON, RETICCTPCT in the last 72 hours. Sepsis Labs: Recent Labs  Lab 11/10/20 2012 11/13/20 1258 11/14/20 [id number]  PROCALCITON  --  <0.10 <0.10  WBC 7.0 7.3  --   LATICACIDVEN  --  1.9  --     Microbiology Recent Results (from the past 240 hour(s))  Resp Panel by RT-PCR (Flu A&B, Covid) Nasopharyngeal Swab     Status: None   Collection Time: 11/10/20  7:49 PM   Specimen: Nasopharyngeal Swab; Nasopharyngeal(NP) swabs in vial transport medium  Result Value Ref Range Status   SARS Coronavirus 2 by RT PCR NEGATIVE NEGATIVE Final    Comment: (NOTE) SARS-CoV-2 target nucleic acids are NOT DETECTED.  The SARS-CoV-2 RNA is generally detectable in upper respiratory specimens during the acute phase of infection. The lowest concentration of SARS-CoV-2 viral copies this assay can detect is 138 copies/mL. A negative result does not preclude SARS-Cov-2 infection and should not be used as the sole basis for treatment or other patient management decisions. A negative result may occur with  improper specimen collection/handling, submission of specimen other than nasopharyngeal swab, presence of viral mutation(s) within the areas targeted by this assay, and inadequate number of viral copies(<138 copies/mL). A negative result must be combined with clinical observations, patient history, and epidemiological information. The expected result is Negative.  Fact Sheet for Patients:  EntrepreneurPulse.com.au  Fact Sheet for Healthcare Providers:  IncredibleEmployment.be  This test  is no t yet approved or cleared by the [place] FDA and  has been authorized for detection and/or diagnosis of SARS-CoV-2 by FDA under an Emergency Use Authorization (EUA). This EUA will remain  in effect (meaning this test can be used) for the duration of the COVID-19 declaration under Section 564(b)(1) of the Act, 21 U.S.C.section 360bbb-3(b)(1), unless the authorization is terminated  or revoked sooner.       Influenza A by PCR NEGATIVE NEGATIVE Final   Influenza B by PCR NEGATIVE NEGATIVE Final    Comment: (NOTE) The Xpert Xpress SARS-CoV-2/FLU/RSV plus assay is intended as an aid in the diagnosis of influenza from Nasopharyngeal swab specimens and should not be used as a sole basis for treatment. Nasal washings and aspirates are unacceptable for Xpert Xpress SARS-CoV-2/FLU/RSV testing.  Fact Sheet for Patients: EntrepreneurPulse.com.au  Fact Sheet for Healthcare Providers: IncredibleEmployment.be  This test is not yet approved or cleared by the [place] FDA and has been authorized for detection and/or diagnosis of SARS-CoV-2 by FDA under an Emergency Use Authorization (EUA). This EUA will remain in effect (meaning this test can be used) for the duration of the COVID-19 declaration under Section 564(b)(1) of the Act, 21 U.S.C. section 360bbb-3(b)(1), unless the authorization is terminated or revoked.  Performed at [facility], [address] [place], [place] [address]   CULTURE, BLOOD (ROUTINE X 2) w Reflex to ID Panel     Status: None (Preliminary result)   Collection Time: 11/13/20  1:01 PM   Specimen: BLOOD LEFT HAND  Result Value Ref Range Status   Specimen Description BLOOD LEFT HAND  Final   Special Requests   Final    BOTTLES DRAWN AEROBIC AND ANAEROBIC Blood Culture results may not be optimal due to an inadequate volume of blood received in culture bottles   Culture   Final    NO GROWTH < 24  HOURS Performed at [facility], [address] [place], [place] [address]    Report Status PENDING  Incomplete  CULTURE, BLOOD (ROUTINE X 2) w Reflex to ID Panel     Status: None (Preliminary result)   Collection Time: 11/13/20  1:03 PM   Specimen: BLOOD  Result Value Ref Range Status   Specimen Description BLOOD BLOOD LEFT WRIST  Final   Special Requests   Final    BOTTLES DRAWN AEROBIC ONLY Blood Culture results may not be optimal due to an inadequate volume of blood received in culture bottles   Culture   Final    NO GROWTH < 24 HOURS Performed at [facility], [address] [place], [place] [address]    Report Status PENDING  Incomplete    Procedures and diagnostic studies:  DG Chest Port 1 View  Result Date: 11/13/2020 CLINICAL DATA:  Cough. EXAM: PORTABLE CHEST 1 VIEW COMPARISON:  May 28, 2020 FINDINGS: Mild opacity in the left mid lung suspected. The heart, hila, mediastinum, and pleura are normal. No other acute abnormalities. IMPRESSION: Suspected mild opacity in left mid lung may represent subtle pneumonia. Recommend short-term follow-up imaging after treatment. Electronically Signed   By: Dorise Bullion III M.D.   On: 11/13/2020 11:31               LOS: 3 days   Vanellope Pass[place]  [facility]   Pager on [email or web address]. If 7PM-7AM, please contact night-coverage at [email or web address]     11/14/2020, 12:29 PM

## 2020-11-14 NOTE — NC FL2 (Signed)
[place] LEVEL OF CARE SCREENING TOOL     IDENTIFICATION  Patient Name: Tina Pacheco Birthdate: 11/16/1950 Sex: female Admission Date (Current Location): 11/10/2020  [facility] [place] Number:  Engineering geologist and Address:  [facility], [address] [place], [place] [address]      Provider Number:    Attending Physician Name and Address:  Jennye Boroughs, MD  Relative Name and Phone Number:  Britzel, Forys (Spouse)   [phone] (Mobile)    Current Level of Care: Hospital Recommended Level of Care: [place] Prior Approval Number:    Date Approved/Denied: 07/13/17 PASRR Number: [id number]  Discharge Plan: SNF    Current Diagnoses: Patient Active Problem List   Diagnosis Date Noted   Decubitus ulcer of sacral region, stage 2 ([place]) 11/12/2020   Closed dislocation of right shoulder 11/10/2020   Stage 3b chronic kidney disease ([place]) 07/07/2020   Abnormal nuclear stress test    Demand ischemia ([place])    Polymyalgia rheumatica ([place]) 02/03/2020   Debility 01/28/2020   Intertrigo of genitocrural region due to Candida species 01/05/2020   Intertrigo 01/05/2020   Chronic diastolic heart failure ([place])    Pressure injury of skin 03/31/2019   COPD (chronic obstructive pulmonary disease) ([place]) 03/30/2019   Urge incontinence of urine 12/23/2018   Left leg pain 09/18/2018   Chronic gouty arthropathy without tophi 04/01/2018   Preventative health care 03/20/2018   Mood disorder ([place]) 03/20/2018   Advance directive discussed with patient 03/20/2018   Lymphedema 11/26/2017   Narcotic dependence ([place]) 03/09/2017   Vertigo 06/28/2016   Bronchiectasis without acute exacerbation ([place]) 06/15/2015   DDD (degenerative disc disease), lumbosacral 03/30/2015   Sleep apnea    Atherosclerotic heart disease of native coronary artery with angina pectoris ([place]) 08/25/2014   Paroxysmal A-fib ([place]) 08/25/2014   Scleritis  and episcleritis of right eye 08/09/2014   RLS (restless legs syndrome) 07/06/2014   Gout 05/26/2014   Chronic pain syndrome 05/26/2014   Migraine 04/08/2014   Acquired deformity of arm 01/05/2014   Back pain 07/22/2013   Metatarsalgia of right foot 07/22/2013   Insomnia 07/15/2013   OA (osteoarthritis) 11/20/2012   DJD (degenerative joint disease) of cervical spine 09/10/2012   Trapezius muscle spasm 07/21/2011   Allergic rhinitis 07/10/2011   Rheumatoid arthritis ([place]) 03/06/2011   Morbid obesity with BMI of 50.0-59.9, adult ([place]) 06/22/2010   HLD (hyperlipidemia) 06/22/2010   HTN (hypertension) 06/22/2010   CAD (coronary artery disease) 06/22/2010   Long term current use of anticoagulant therapy 05/27/2010    Orientation RESPIRATION BLADDER Height & Weight     Self, Time  Normal Continent Weight: 131.5 kg Height:  '5\' 3"'$  (160 cm)  BEHAVIORAL SYMPTOMS/MOOD NEUROLOGICAL BOWEL NUTRITION STATUS      Continent Diet  AMBULATORY STATUS COMMUNICATION OF NEEDS Skin   Extensive Assist (balance issues/RA) Verbally Normal                       Personal Care Assistance Level of Assistance  Bathing, Dressing Bathing Assistance: Limited assistance   Dressing Assistance: Limited assistance     Functional Limitations Info             SPECIAL CARE FACTORS FREQUENCY  PT (By licensed PT), OT (By licensed OT)     PT Frequency: 5x/week OT Frequency: 5x/week            Contractures Contractures Info: Not present    Additional  Factors Info  Code Status, Allergies Code Status Info: Full Code Allergies Info: Fluoxetine Medium Other (See Comments) Headache, shaking, sleep issues Headache, shaking, sleep issues.  Codeine Low  Nausea and vomiting/only when taking too much           Current Medications (11/14/2020):  This is the current hospital active medication list Current Facility-Administered Medications  Medication Dose Route Frequency Provider Last Rate Last Admin    amiodarone (PACERONE) tablet 200 mg  200 mg Oral Daily Poggi, Marshall Cork, MD   200 mg at 11/14/20 [id number]   apixaban (ELIQUIS) tablet 5 mg  5 mg Oral BID Poggi, Marshall Cork, MD   5 mg at 11/14/20 0820   atorvastatin (LIPITOR) tablet 20 mg  20 mg Oral PC supper Poggi, Marshall Cork, MD   20 mg at 11/13/20 1736   benzocaine (ORAJEL) 10 % mucosal gel   Mouth/Throat TID PRN Jennye Boroughs, MD   Given at 11/14/20 1230   benzonatate (TESSALON) capsule 100 mg  100 mg Oral TID Jennye Boroughs, MD   100 mg at 11/14/20 1408   bisacodyl (DULCOLAX) suppository 10 mg  10 mg Rectal Daily PRN Poggi, Marshall Cork, MD       diphenhydrAMINE (BENADRYL) 12.5 MG/5ML elixir 12.5-25 mg  12.5-25 mg Oral Q4H PRN Poggi, Marshall Cork, MD       docusate sodium (COLACE) capsule 100 mg  100 mg Oral BID Poggi, Marshall Cork, MD   100 mg at 11/14/20 0820   gabapentin (NEURONTIN) capsule 300 mg  300 mg Oral QID Corky Mull, MD   300 mg at 11/14/20 1408   HYDROcodone-acetaminophen (NORCO/VICODIN) 5-325 MG per tablet 1-2 tablet  1-2 tablet Oral Q4H PRN Poggi, Marshall Cork, MD   2 tablet at 11/14/20 1413   ketoconazole (NIZORAL) 2 % cream   Topical BID PRN Poggi, Marshall Cork, MD       leflunomide (ARAVA) tablet 20 mg  20 mg Oral Daily Poggi, Marshall Cork, MD   20 mg at 11/14/20 0820   magnesium hydroxide (MILK OF MAGNESIA) suspension 30 mL  30 mL Oral Daily PRN Poggi, Marshall Cork, MD       meclizine (ANTIVERT) tablet 25 mg  25 mg Oral TID PRN Poggi, Marshall Cork, MD       methocarbamol (ROBAXIN) tablet 500 mg  500 mg Oral Q6H PRN Jennye Boroughs, MD   500 mg at 11/13/20 1736   metoCLOPramide (REGLAN) tablet 5-10 mg  5-10 mg Oral Q8H PRN Poggi, Marshall Cork, MD       Or   metoCLOPramide (REGLAN) injection 5-10 mg  5-10 mg Intravenous Q8H PRN Poggi, Marshall Cork, MD       metoprolol succinate (TOPROL-XL) 24 hr tablet 50 mg  50 mg Oral Daily Poggi, Marshall Cork, MD   50 mg at 11/14/20 0820   montelukast (SINGULAIR) tablet 10 mg  10 mg Oral QHS Poggi, Marshall Cork, MD   10 mg at 11/13/20 2008   morphine 2 MG/ML injection  2 mg  2 mg Intravenous Q2H PRN Poggi, Marshall Cork, MD   2 mg at 11/11/20 [id number]   nitroGLYCERIN (NITROSTAT) SL tablet 0.4 mg  0.4 mg Sublingual Q5 min PRN Poggi, Marshall Cork, MD       nystatin (MYCOSTATIN/NYSTOP) topical powder 1 application  1 application Topical TID PRN Poggi, Marshall Cork, MD   1 application at [id number]   ondansetron (ZOFRAN) tablet 4 mg  4 mg Oral Q6H PRN Poggi, Marshall Cork,  MD       Or   ondansetron (ZOFRAN) injection 4 mg  4 mg Intravenous Q6H PRN Poggi, Marshall Cork, MD       oxyCODONE (Oxy IR/ROXICODONE) immediate release tablet 20 mg  20 mg Oral BID Poggi, Marshall Cork, MD   20 mg at 11/14/20 0820   polyethylene glycol (MIRALAX / GLYCOLAX) packet 17 g  17 g Oral Daily PRN Poggi, Marshall Cork, MD   17 g at 11/14/20 1144   potassium chloride SA (KLOR-CON) CR tablet 20 mEq  20 mEq Oral Q breakfast Poggi, Marshall Cork, MD   20 mEq at 11/14/20 0820   sodium phosphate (FLEET) 7-19 GM/118ML enema 1 enema  1 enema Rectal Once PRN Poggi, Marshall Cork, MD       spironolactone (ALDACTONE) tablet 25 mg  25 mg Oral Daily Poggi, Marshall Cork, MD   25 mg at 11/14/20 [id number]   terazosin (HYTRIN) capsule 10 mg  10 mg Oral QHS Poggi, Marshall Cork, MD   10 mg at 11/13/20 2008   torsemide (DEMADEX) tablet 40 mg  40 mg Oral Daily Poggi, Marshall Cork, MD   40 mg at 11/14/20 0820     Discharge Medications: Please see discharge summary for a list of discharge medications.  Relevant Imaging Results:  Relevant Lab Results:   Additional Information    Izola Price, RN

## 2020-11-14 NOTE — TOC Initial Note (Addendum)
Transition of [facility]) - Initial/Assessment Note    Patient Details  Name: Tina Pacheco MRN: [id number] Date of Birth: [birth year]  [facility]) CM/SW Contact:    Izola Price, RN Phone Number: 11/14/2020, 3:37 PM  Clinical Narrative:  : Patient admitted for closed reduction of Right dislocated shoulder. PMH significant for multiple issues. Lives with spouse. Spouse drives patient for needs, no issues getting medications. DME: [employer]--used rollator at baseline daily. Prior [facility] agency was A Doctor, general practice. Has been in [facility] and is preference for STR placement. PCP/Rx/Address/Contact information all correct in EHR.  Discussed PT recommendations and patient is agreeable to start bed search with [facility] as preference. If not [facility], she may wish to see if [facility] could be set up for her needs. Discussed that weekday CM will follow up with STR/SNF facility acceptances and go from there. Patient pleasant, cooperative, expressed wishes. Simmie Davies RN CM 344pm.                Dalena, Graci (Spouse)  [phone] (Mobile) PCP: Viviana Simpler [id number] [facility] [phone] - , [place], [place] SITE Expected Discharge Plan: Skilled Nursing Facility Barriers to Discharge: Continued Medical Work up   Patient Goals and CMS Choice Patient states their goals for this hospitalization and ongoing recovery are:: Get home as soon as possible. CMS Medicare.gov Compare Post Acute Care list provided to:: Patient Choice offered to / list presented to : Patient  Expected Discharge Plan and Services Expected Discharge Plan: [place]   Discharge Planning Services: CM Consult Post Acute Care Choice: [place] arrangements for the past 2 months: Single Family  Home                 DME Arranged: N/A DME Agency: NA                  Prior Living Arrangements/Services Living arrangements for the past 2 months: Single Family Home Lives with:: Spouse Patient language and need for interpreter reviewed:: No        Need for Family Participation in Patient Care: Yes (Comment) Care giver support system in place?: Yes (comment) (Limited physical support) Current home services: DME, Homehealth aide, Home PT ([facility], Has [facility] Cane.) Criminal Activity/Legal Involvement Pertinent to Current Situation/Hospitalization: No - Comment as needed  Activities of Daily Living Home Assistive Devices/Equipment: Bedside commode/3-in-1, Cane (specify quad or straight), Grab bars around toilet, Grab bars in shower, Walker (specify type) ADL Screening (condition at time of admission) Patient's cognitive ability adequate to safely complete daily activities?: Yes Is the patient deaf or have difficulty hearing?: No Does the patient have difficulty seeing, even when wearing glasses/contacts?: No Does the patient have difficulty concentrating, remembering, or making decisions?: Yes Patient able to express need for assistance with ADLs?: Yes Does the patient have difficulty dressing or bathing?: Yes Independently performs ADLs?: No Does the patient have difficulty walking or climbing stairs?: Yes Weakness of Legs: Both Weakness of Arms/Hands: Both  Permission Sought/Granted   Permission granted to share information with : Yes, Verbal Permission Granted  Share Information with NAME: Latashia Timberlake  Permission granted to share info w AGENCY: STR/SNF facilities, DME agencies, "anyone neccessary".  Permission granted to share info w Relationship: Spouse  Permission granted to share info w Contact Information: Temprence, Body (Spouse)   [phone] (Mobile  Emotional Assessment Appearance:: Appears stated age Attitude/Demeanor/Rapport:  Engaged Affect (typically observed): Accepting, Calm, Pleasant Orientation: : Oriented to Self, Oriented to Place, Oriented to  Time, Oriented to Situation Alcohol / Substance Use: Not Applicable Psych Involvement: No (comment)  Admission diagnosis:  Shoulder dislocation, right, initial encounter [S43.004A] Chronic dislocation of right shoulder [M24.411] Closed dislocation of right shoulder [S43.004A] Patient Active Problem List   Diagnosis Date Noted   Decubitus ulcer of sacral region, stage 2 ([place]) 11/12/2020   Closed dislocation of right shoulder 11/10/2020   Stage 3b chronic kidney disease ([place]) 07/07/2020   Abnormal nuclear stress test    Demand ischemia ([place])    Polymyalgia rheumatica ([place]) 02/03/2020   Debility 01/28/2020   Intertrigo of genitocrural region due to Candida species 01/05/2020   Intertrigo 01/05/2020   Chronic diastolic heart failure (HCC)    Pressure injury of skin 03/31/2019   COPD (chronic obstructive pulmonary disease) ([place]) 03/30/2019   Urge incontinence of urine 12/23/2018   Left leg pain 09/18/2018   Chronic gouty arthropathy without tophi 04/01/2018   Preventative health care 03/20/2018   Mood disorder ([place]) 03/20/2018   Advance directive discussed with patient 03/20/2018   Lymphedema 11/26/2017   Narcotic dependence ([place]) 03/09/2017   Vertigo 06/28/2016   Bronchiectasis without acute exacerbation ([place]) 06/15/2015   DDD (degenerative disc disease), lumbosacral 03/30/2015   Sleep apnea    Atherosclerotic heart disease of native coronary artery with angina pectoris ([place]) 08/25/2014   Paroxysmal A-fib ([place]) 08/25/2014   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   RLS (restless legs syndrome) 07/06/2014   Gout 05/26/2014   Chronic pain syndrome 05/26/2014   Migraine 04/08/2014   Acquired deformity of arm 01/05/2014   Back pain 07/22/2013   Metatarsalgia of right foot 07/22/2013   Insomnia 07/15/2013   OA (osteoarthritis) 11/20/2012    DJD (degenerative joint disease) of cervical spine 09/10/2012   Trapezius muscle spasm 07/21/2011   Allergic rhinitis 07/10/2011   Rheumatoid arthritis ([place]) 03/06/2011   Morbid obesity with BMI of 50.0-59.9, adult ([place]) 06/22/2010   HLD (hyperlipidemia) 06/22/2010   HTN (hypertension) 06/22/2010   CAD (coronary artery disease) 06/22/2010   Long term current use of anticoagulant therapy 05/27/2010   PCP:  Venia Carbon, MD Pharmacy:   [facility] [place], [place] - [address] [place] [address] Phone: [phone] Fax: [phone]  [facility] [place] [address] [facility] [address] to Registered [employer] [place] [address] Phone: [phone] Fax: [phone]     Social Determinants of Health (SDOH) Interventions    Readmission Risk Interventions Readmission Risk Prevention Plan 06/01/2020  Transportation Screening Complete  PCP or Specialist Appt within 3-5 Days Complete  HRI or Home Care Consult Complete  Social Work Consult for [place] Planning/Counseling Complete  Palliative Care Screening Not Applicable  Medication Review Press photographer) (No Data)  Some recent data might be hidden

## 2020-11-15 DIAGNOSIS — S43004A Unspecified dislocation of right shoulder joint, initial encounter: Secondary | ICD-10-CM | POA: Diagnosis not present

## 2020-11-15 LAB — PROCALCITONIN: Procalcitonin: <0.1 ng/mL

## 2020-11-15 MED ORDER — CEFAZOLIN IN SODIUM CHLORIDE 3-0.9 GM/100ML-% IV SOLN
3.0000 g | INTRAVENOUS | Status: AC
  Administered 2020-11-16: 3 g via INTRAVENOUS
  Filled 2020-11-15: qty 100

## 2020-11-15 NOTE — Progress Notes (Signed)
Called on call ortho Dr Marry Guan. Notified him patient is scheduled for reverse total right shoulder tomorrow but has been taking her Eliquis for afib. Orders received to hold the Eliquis tonight and tomorrow. He will discuss with surgeon Dr Roland Rack to make decisions regarding surgery. Erling Conte, RN

## 2020-11-15 NOTE — Progress Notes (Signed)
Dr Roland Rack called and gave order to hold Eliquis today and tomorrow and will proceed with surgery tomorrow. Erling Conte, RN

## 2020-11-15 NOTE — Progress Notes (Signed)
Patient ID: Tina Pacheco, female   DOB: 10/23/1950, 70 y.o.   MRN: [id number]  Subjective: The patient notes the recurrence of her right shoulder pain over the weekend despite being in her shoulder immobilizer.  She denies any reinjury to the shoulder and does not recall feeling any pop or other similar symptom while trying to shift in bed or to get out of bed over the course of the weekend.  However, a chest x-ray from 2 days ago does demonstrate a recurrent anterior dislocation of the right shoulder.  The patient denies any numbness or paresthesias down her arm to her hand.  Objective: Vital signs in last 24 hours: Temp:  [97.2 F (36.2 C)-97.9 F (36.6 C)] 97.2 F (36.2 C) (09/19 0745) Pulse Rate:  [50-56] 56 (09/19 0745) Resp:  [15-18] 17 (09/19 0745) BP: (124-143)/(45-80) 138/80 (09/19 0745) SpO2:  [95 %-96 %] 95 % (09/19 0452)  Intake/Output from previous day: 09/18 0701 - 09/19 0700 In: 480 [P.O.:480] Out: 1000 [Urine:1000] Intake/Output this shift: Total I/O In: 470 [P.O.:470] Out: 1150 [Urine:1150]  Recent Labs    11/13/20 1258  HGB 12.5   Recent Labs    11/13/20 1258  WBC 7.3  RBC 4.12  HCT 38.4  PLT 186   Recent Labs    11/14/20 0613  NA 137  K 3.7  CL 99  CO2 31  BUN 37*  CREATININE 1.24*  GLUCOSE 117*  CALCIUM 9.7   No results for input(s): LABPT, INR in the last 72 hours.  Physical Exam: Orthopedic examination is limited to the right shoulder and upper extremity.  Skin inspection around the right shoulder again is notable for some mild asymmetry as compared to the left, but otherwise is unremarkable.  No swelling, erythema, ecchymosis, abrasions, or other skin abnormalities are identified.  There is mild tenderness to palpation over the anterior and lateral aspects of the shoulder.  She exhibits more severe pain with any attempted active or passive motion of the shoulder.  She remains neurovascularly intact to the right upper extremity and hand,  including intact sensation to light touch over the axillary nerve and musculocutaneous nerve distributions.  X-rays: An AP chest x-ray from 2 days ago was available for review and has been reviewed by myself.  This film demonstrates a recurrent anterior inferior dislocation of the right glenohumeral joint.  No fractures, lytic lesions, or significant degenerative changes are identified.  Assessment: Recurrent dislocation of right shoulder.  Plan: The treatment options, including both surgical and nonsurgical choices, have been discussed in detail with the patient and her family.  The patient is quite frustrated by the recurrent dislocation and persistent symptoms she is experiencing, and is ready to consider more aggressive treatment options.  Therefore, I have suggested that we proceed with surgical procedure, specifically a reverse right total shoulder arthroplasty.  The risks (including bleeding, infection, nerve and/or blood vessel injury, persistent or recurrent pain, loosening or failure of the components, leg length inequality, dislocation, need for further surgery, blood clots, strokes, heart attacks or arrhythmias, pneumonia, etc.) and benefits of the surgical procedure were discussed.  The patient states his/her understanding and agrees to proceed.  A formal written consent will be obtained by the nursing staff.   Marshall Cork Yonas Bunda 11/15/2020, 3:22 PM

## 2020-11-15 NOTE — Progress Notes (Signed)
Physical Therapy Treatment Patient Details Name: Tina Pacheco MRN: [id number] DOB: 10/24/1950 Today's Date: 11/15/2020   History of Present Illness 70 y.o. female with medical history significant for Rheumatoid arthritis on Arava, CAD, paroxysmal A. fib on Eliquis, chronic diastolic heart failure, HTN, COPD, sleep apnea, morbid obesity with BMI of 50 who presents to the ED on referral from [facility], for management of right shoulder dislocation.    PT Comments    Pt seen for PT tx with pt reporting RUE pain but wishing to get OOB to [facility].  Pt is able to complete bed mobility with supervision, HOB elevated, bed rails and extra time to complete movement. Pt is able to complete stand pivot bed>BSC with supervision with cuing for hand placement on BSC armrest. Pt left on [facility] with call bell in reach as pt reports she needs extra time to have a BM, nursing staff notified.    Recommendations for follow up therapy are one component of a multi-disciplinary discharge planning process, led by the attending physician.  Recommendations may be updated based on patient status, additional functional criteria and insurance authorization.  Follow Up Recommendations  SNF     Equipment Recommendations  None recommended by PT    Recommendations for Other Services       Precautions / Restrictions Precautions Precautions: Fall Required Braces or Orthoses: Sling Restrictions Weight Bearing Restrictions: Yes RUE Weight Bearing: Non weight bearing Other Position/Activity Restrictions: Per Dr. Roland Rack: keep the sling on as much as possible for the next several weeks, removing it for hygiene purposes only     Mobility  Bed Mobility Overal bed mobility: Needs Assistance Bed Mobility: Supine to Sit     Supine to sit: Supervision;HOB elevated     General bed mobility comments: extra time to complete supine>sit with use of bed rails & scooting to EOB    Transfers Overall transfer level: Needs  assistance   Transfers: Sit to/from Stand;Stand Pivot Transfers Sit to Stand: Supervision Stand pivot transfers: Supervision       General transfer comment: Pt completes sit>stand & stand pivot bed>BSC with LUE support on BSC armrest  Ambulation/Gait                 Stairs             Wheelchair Mobility    Modified Rankin (Stroke Patients Only)       Balance Overall balance assessment: Needs assistance Sitting-balance support: Single extremity supported;Feet supported Sitting balance-Leahy Scale: Good     Standing balance support: Single extremity supported;During functional activity Standing balance-Leahy Scale: Fair                              Cognition Arousal/Alertness: Awake/alert Behavior During Therapy: WFL for tasks assessed/performed Overall Cognitive Status: Within Functional Limits for tasks assessed                                 General Comments: A&Ox4      Exercises      General Comments General comments (skin integrity, edema, etc.): Pt notes her balance is not that good      Pertinent Vitals/Pain Pain Assessment: No/denies pain Faces Pain Scale: Hurts even more Pain Location: R shoulder Pain Descriptors / Indicators: Aching;Discomfort;Grimacing Pain Intervention(s): Limited activity within patient's tolerance;Monitored during session    Home Living  Prior Function            PT Goals (current goals can now be found in the care plan section) Acute Rehab PT Goals Patient Stated Goal: to go home PT Goal Formulation: With patient Time For Goal Achievement: 11/26/20 Potential to Achieve Goals: Fair Progress towards PT goals: Progressing toward goals    Frequency    Min 2X/week      PT Plan Current plan remains appropriate    Co-evaluation              AM-PAC PT "6 Clicks" Mobility   Outcome Measure  Help needed turning from your back to your side  while in a flat bed without using bedrails?: A Little Help needed moving from lying on your back to sitting on the side of a flat bed without using bedrails?: A Lot Help needed moving to and from a bed to a chair (including a wheelchair)?: A Little Help needed standing up from a chair using your arms (e.g., wheelchair or bedside chair)?: A Little Help needed to walk in hospital room?: A Lot Help needed climbing 3-5 steps with a railing? : Total 6 Click Score: 14    End of Session Equipment Utilized During Treatment:  (RUE sling) Activity Tolerance: Patient tolerated treatment well Patient left:  (on [facility] with call bell in hand & instructions to call nursing staff when finished - nurse tech made aware) Nurse Communication: Mobility status PT Visit Diagnosis: Unsteadiness on feet (R26.81);Other abnormalities of gait and mobility (R26.89);History of falling (Z91.81);Muscle weakness (generalized) (M62.81);Pain Pain - Right/Left: Right Pain - part of body: Shoulder     Time: [id number] PT Time Calculation (min) (ACUTE ONLY): 8 min  Charges:  $Therapeutic Activity: 8-22 mins                     Lavone Nian, PT, DPT 11/15/20, 3:40 PM    [name] 11/15/2020, 3:25 PM

## 2020-11-15 NOTE — TOC Progression Note (Addendum)
Transition of Care [facility]) - Progression Note    Patient Details  Name: Tina Pacheco MRN: [id number] Date of Birth: 1951/01/20  Transition of Care [facility]) CM/SW [place], RN Phone Number: 11/15/2020, 2:17 PM  Clinical Narrative:   Met with the patient and reviewed the bed offers, she has been to [facility] twice and accepted the bed offer, I notified [employer] both via phone and hub, she will need a covid test prior to DC, I received a message from [facility] stating that the bed will not be available until later in the week. I  Explained to the patient and reviewed the other options, she chose [facility]. I notified [facility], they stated that they would touch base with me in the morning for what bed to go to Update, I received a call from [facility], I notified them that she will have Surgery on her shoulder and then will DC possibly the next day, (Wed)    Expected Discharge Plan: [place] Barriers to Discharge: Continued Medical Work up  Expected Discharge Plan and Services Expected Discharge Plan: [place]   Discharge Planning Services: CM Consult Post Acute Care Choice: [place] arrangements for the past 2 months: Single Family Home                 DME Arranged: N/A DME Agency: NA                   Social Determinants of Health (SDOH) Interventions    Readmission Risk Interventions Readmission Risk Prevention Plan 11/14/2020 06/01/2020  Transportation Screening Complete Complete  PCP or Specialist Appt within 3-5 Days - Complete  HRI or [place] - Complete  Social Work Consult for [place] Planning/Counseling - Complete  Palliative Care Screening - Not Applicable  Medication Review Press photographer) Complete (No Data)  PCP or Specialist appointment within 3-5 days of discharge Complete -  HRI or [place] (No Data) -  SW Recovery Care/Counseling Consult  Complete -  Palliative Care Screening Not Applicable -  Skilled Nursing Facility Complete -  Some recent data might be hidden

## 2020-11-15 NOTE — Progress Notes (Addendum)
Progress Note    Tina Pacheco  HYW:[id number] DOB: 1951-02-01  DOA: 11/10/2020 PCP: Venia Carbon, MD      Brief Narrative:    Medical records reviewed and are as summarized below:  Tina Pacheco is a 70 y.o. female with medical history significant for Rheumatoid arthritis on Arava, CAD, paroxysmal A. fib on Eliquis, chronic diastolic heart failure, HTN, CKD stage IIIa, COPD, sleep apnea, morbid obesity with BMI of 50 who presents to the ED on referral from [facility], for management of right shoulder dislocation.      Assessment/Plan:   Principal Problem:   Closed dislocation of right shoulder Active Problems:   Long term current use of anticoagulant therapy   Morbid obesity with BMI of 50.0-59.9, adult (HCC)   HTN (hypertension)   CAD (coronary artery disease)   Rheumatoid arthritis (HCC)   Paroxysmal A-fib (HCC)   Sleep apnea   COPD (chronic obstructive pulmonary disease) (HCC)   Decubitus ulcer of sacral region, stage 2 (HCC)    Body mass index is 51.35 kg/m.  (Morbid obesity)   Nontraumatic close dislocation of right shoulder, fracture of right posterior superior humeral head, severe subacromial/subdeltoid bursitis: S/p reduction of right shoulder dislocation in the OR on 11/11/2020.  Continue analgesics as needed for pain.  Patient requested to see Dr. Roland Rack, orthopedic surgeon, to reevaluate her right shoulder.  Dr. Roland Rack was notified via secure chat.   Cough, suspected left midlung pneumonia: Chest x-ray done on 11/13/2020 showed infiltrate in the left midlung.  Lactic acid and procalcitonin levels were normal.  No growth on cultures thus far.  Continue Augmentin for total of 5 days.    Paroxysmal atrial fibrillation: Continue Eliquis, metoprolol and amiodarone  Chronic diastolic CHF: Compensated.  Continue diuretics  Rheumatoid arthritis: Continue Arava  Other comorbidities include hypertension, CAD, OSA, CKD stage IIIa  Awaiting placement to  SNF   Diet Order             Diet Heart Room service appropriate? Yes; Fluid consistency: Thin  Diet effective now                      Consultants: Orthopedic surgeon  Procedures: S/p reduction of shoulder right shoulder dislocation in the OR on 11/11/2020   Medications:    amiodarone  200 mg Oral Daily   apixaban  5 mg Oral BID   atorvastatin  20 mg Oral PC supper   benzonatate  100 mg Oral TID   docusate sodium  100 mg Oral BID   gabapentin  300 mg Oral QID   leflunomide  20 mg Oral Daily   metoprolol succinate  50 mg Oral Daily   montelukast  10 mg Oral QHS   oxyCODONE  20 mg Oral BID   potassium chloride SA  20 mEq Oral Q breakfast   spironolactone  25 mg Oral Daily   terazosin  10 mg Oral QHS   torsemide  40 mg Oral Daily   Continuous Infusions:     Anti-infectives (From admission, onward)    Start     Dose/Rate Route Frequency Ordered Stop   11/11/20 1203  ceFAZolin (ANCEF) 2-4 GM/100ML-% IVPB       Note to Pharmacy: Lyman Bishop   : cabinet override      11/11/20 1203 11/12/20 0014   11/11/20 0800  ceFAZolin (ANCEF) IVPB 2g/100 mL premix  Status:  Discontinued        2  g 200 mL/hr over 30 Minutes Intravenous 30 min pre-op 11/10/20 2016 11/11/20 1703              Family Communication/Anticipated D/C date and plan/Code Status   DVT prophylaxis: Foot Pump / plexipulse Start: 11/11/20 1704 apixaban (ELIQUIS) tablet 5 mg     Code Status: Full Code  Family Communication: None Disposition Plan:    Status is: Inpatient  Remains inpatient appropriate because:Unsafe d/c plan and Inpatient level of care appropriate due to severity of illness  Dispo: The patient is from: Home              Anticipated d/c is to: Home              Patient currently is not medically stable to d/c.   Difficult to place patient No                 Subjective:   She still complains of right shoulder.  Cough is better.  No shortness of  breath or chest pain.  Objective:    Vitals:   11/14/20 1556 11/14/20 2048 11/15/20 0452 11/15/20 0745  BP: (!) 124/48 (!) 143/45 (!) 137/56 138/80  Pulse: (!) 51 (!) 50 (!) 52 (!) 56  Resp: 15 18 17 17   Temp: (!) 97.3 F (36.3 C) 97.9 F (36.6 C) 97.7 F (36.5 C) (!) 97.2 F (36.2 C)  TempSrc:   Oral Oral  SpO2: 95% 96% 95%   Weight:      Height:       No data found.   Intake/Output Summary (Last 24 hours) at 11/15/2020 1249 Last data filed at 11/15/2020 0905 Gross per 24 hour  Intake 350 ml  Output 1100 ml  Net -750 ml   Filed Weights   11/10/20 1700 11/11/20 1153  Weight: 131.5 kg 131.5 kg    Exam:  GEN: NAD SKIN: Stage II sacral decubitus ulcer, erythematous areas on the lower abdomen EYES: No pallor or icterus ENT: MMM CV: RRR PULM: CTA B ABD: soft, obese, NT, +BS CNS: AAO x 3, non focal EXT: Right shoulder tenderness.  Right arm is immobilized in a sling         Data Reviewed:   I have personally reviewed following labs and imaging studies:  Labs: Labs show the following:   Basic Metabolic Panel: Recent Labs  Lab 11/10/20 2012 11/14/20 0613  NA 138 137  K 3.9 3.7  CL 103 99  CO2 25 31  GLUCOSE 88 117*  BUN 28* 37*  CREATININE 1.19* 1.24*  CALCIUM 10.2 9.7   GFR Estimated Creatinine Clearance: 56 mL/min (A) (by C-G formula based on SCr of 1.24 mg/dL (H)). Liver Function Tests: No results for input(s): AST, ALT, ALKPHOS, BILITOT, PROT, ALBUMIN in the last 168 hours. No results for input(s): LIPASE, AMYLASE in the last 168 hours. No results for input(s): AMMONIA in the last 168 hours. Coagulation profile No results for input(s): INR, PROTIME in the last 168 hours.  CBC: Recent Labs  Lab 11/10/20 2012 11/13/20 1258  WBC 7.0 7.3  NEUTROABS 4.2 5.1  HGB 12.7 12.5  HCT 39.4 38.4  MCV 94.3 93.2  PLT 165 186   Cardiac Enzymes: No results for input(s): CKTOTAL, CKMB, CKMBINDEX, TROPONINI in the last 168 hours. BNP (last 3  results) No results for input(s): PROBNP in the last 8760 hours. CBG: No results for input(s): GLUCAP in the last 168 hours. D-Dimer: No results for input(s): DDIMER in the last  72 hours. Hgb A1c: No results for input(s): HGBA1C in the last 72 hours. Lipid Profile: No results for input(s): CHOL, HDL, LDLCALC, TRIG, CHOLHDL, LDLDIRECT in the last 72 hours. Thyroid function studies: No results for input(s): TSH, T4TOTAL, T3FREE, THYROIDAB in the last 72 hours.  Invalid input(s): FREET3 Anemia work up: No results for input(s): VITAMINB12, FOLATE, FERRITIN, TIBC, IRON, RETICCTPCT in the last 72 hours. Sepsis Labs: Recent Labs  Lab 11/10/20 2012 11/13/20 1258 11/14/20 0613 11/15/20 0626  PROCALCITON  --  <0.10 <0.10 <0.10  WBC 7.0 7.3  --   --   LATICACIDVEN  --  1.9  --   --     Microbiology Recent Results (from the past 240 hour(s))  Resp Panel by RT-PCR (Flu A&B, Covid) Nasopharyngeal Swab     Status: None   Collection Time: 11/10/20  7:49 PM   Specimen: Nasopharyngeal Swab; Nasopharyngeal(NP) swabs in vial transport medium  Result Value Ref Range Status   SARS Coronavirus 2 by RT PCR NEGATIVE NEGATIVE Final    Comment: (NOTE) SARS-CoV-2 target nucleic acids are NOT DETECTED.  The SARS-CoV-2 RNA is generally detectable in upper respiratory specimens during the acute phase of infection. The lowest concentration of SARS-CoV-2 viral copies this assay can detect is 138 copies/mL. A negative result does not preclude SARS-Cov-2 infection and should not be used as the sole basis for treatment or other patient management decisions. A negative result may occur with  improper specimen collection/handling, submission of specimen other than nasopharyngeal swab, presence of viral mutation(s) within the areas targeted by this assay, and inadequate number of viral copies(<138 copies/mL). A negative result must be combined with clinical observations, patient history, and  epidemiological information. The expected result is Negative.  Fact Sheet for Patients:  EntrepreneurPulse.com.au  Fact Sheet for Healthcare Providers:  IncredibleEmployment.be  This test is no t yet approved or cleared by the [place] FDA and  has been authorized for detection and/or diagnosis of SARS-CoV-2 by FDA under an Emergency Use Authorization (EUA). This EUA will remain  in effect (meaning this test can be used) for the duration of the COVID-19 declaration under Section 564(b)(1) of the Act, 21 U.S.C.section 360bbb-3(b)(1), unless the authorization is terminated  or revoked sooner.       Influenza A by PCR NEGATIVE NEGATIVE Final   Influenza B by PCR NEGATIVE NEGATIVE Final    Comment: (NOTE) The Xpert Xpress SARS-CoV-2/FLU/RSV plus assay is intended as an aid in the diagnosis of influenza from Nasopharyngeal swab specimens and should not be used as a sole basis for treatment. Nasal washings and aspirates are unacceptable for Xpert Xpress SARS-CoV-2/FLU/RSV testing.  Fact Sheet for Patients: EntrepreneurPulse.com.au  Fact Sheet for Healthcare Providers: IncredibleEmployment.be  This test is not yet approved or cleared by the [place] FDA and has been authorized for detection and/or diagnosis of SARS-CoV-2 by FDA under an Emergency Use Authorization (EUA). This EUA will remain in effect (meaning this test can be used) for the duration of the COVID-19 declaration under Section 564(b)(1) of the Act, 21 U.S.C. section 360bbb-3(b)(1), unless the authorization is terminated or revoked.  Performed at [facility], [place] [place], [place] [address]   CULTURE, BLOOD (ROUTINE X 2) w Reflex to ID Panel     Status: None (Preliminary result)   Collection Time: 11/13/20  1:01 PM   Specimen: BLOOD LEFT HAND  Result Value Ref Range Status   Specimen Description BLOOD LEFT HAND   Final  Special Requests   Final    BOTTLES DRAWN AEROBIC AND ANAEROBIC Blood Culture results may not be optimal due to an inadequate volume of blood received in culture bottles   Culture   Final    NO GROWTH 2 DAYS Performed at [facility], [address] [place], [place] [address]    Report Status PENDING  Incomplete  CULTURE, BLOOD (ROUTINE X 2) w Reflex to ID Panel     Status: None (Preliminary result)   Collection Time: 11/13/20  1:03 PM   Specimen: BLOOD  Result Value Ref Range Status   Specimen Description BLOOD BLOOD LEFT WRIST  Final   Special Requests   Final    BOTTLES DRAWN AEROBIC ONLY Blood Culture results may not be optimal due to an inadequate volume of blood received in culture bottles   Culture   Final    NO GROWTH 2 DAYS Performed at [facility], [address], [place], [place] [address]    Report Status PENDING  Incomplete    Procedures and diagnostic studies:  No results found.             LOS: 4 days   Cashlyn Huguley  [facility]   Pager on [email or web address]. If 7PM-7AM, please contact night-coverage at [email or web address]     11/15/2020, 12:49 PM

## 2020-11-15 NOTE — Care Management Important Message (Signed)
Important Message  Patient Details  Name: Tina Pacheco MRN: [id number] Date of Birth: [birth year]   Medicare Important Message Given:  Yes     Dannette Barbara 11/15/2020, 12:22 PM

## 2020-11-16 ENCOUNTER — Encounter: Payer: Self-pay | Admitting: Internal Medicine

## 2020-11-16 ENCOUNTER — Inpatient Hospital Stay: Payer: Medicare Other | Admitting: Anesthesiology

## 2020-11-16 ENCOUNTER — Encounter
Admission: EM | Disposition: A | Discharge status: Skilled Nursing Facility | Payer: Self-pay | Source: Home / Self Care | Attending: Internal Medicine

## 2020-11-16 ENCOUNTER — Inpatient Hospital Stay: Payer: Medicare Other

## 2020-11-16 ENCOUNTER — Telehealth: Payer: Self-pay | Admitting: *Deleted

## 2020-11-16 DIAGNOSIS — M75121 Complete rotator cuff tear or rupture of right shoulder, not specified as traumatic: Secondary | ICD-10-CM | POA: Diagnosis not present

## 2020-11-16 DIAGNOSIS — S43004A Unspecified dislocation of right shoulder joint, initial encounter: Secondary | ICD-10-CM | POA: Diagnosis not present

## 2020-11-16 DIAGNOSIS — M24411 Recurrent dislocation, right shoulder: Secondary | ICD-10-CM | POA: Diagnosis not present

## 2020-11-16 HISTORY — PX: REVERSE SHOULDER ARTHROPLASTY: SHX5054

## 2020-11-16 SURGERY — ARTHROPLASTY, SHOULDER, TOTAL, REVERSE
Anesthesia: General | Site: Shoulder | Laterality: Right

## 2020-11-16 MED ORDER — SODIUM CHLORIDE 0.9 % IR SOLN
Status: DC | PRN
  Administered 2020-11-16: 3000 mL

## 2020-11-16 MED ORDER — PROPOFOL 10 MG/ML IV BOLUS
INTRAVENOUS | Status: AC
  Filled 2020-11-16: qty 20

## 2020-11-16 MED ORDER — MAGNESIUM HYDROXIDE 400 MG/5ML PO SUSP: 30.0000 mL | Freq: Every day | ORAL | Status: DC | PRN

## 2020-11-16 MED ORDER — FENTANYL CITRATE (PF) 100 MCG/2ML IJ SOLN
25.0000 ug | INTRAMUSCULAR | Status: DC | PRN
  Administered 2020-11-16 (×4): 25 ug via INTRAVENOUS

## 2020-11-16 MED ORDER — SODIUM CHLORIDE 0.9 % IV SOLN
INTRAVENOUS | Status: DC | PRN
  Administered 2020-11-16: 30 mL

## 2020-11-16 MED ORDER — MIDAZOLAM HCL 2 MG/2ML IJ SOLN
INTRAMUSCULAR | Status: AC
  Filled 2020-11-16: qty 2

## 2020-11-16 MED ORDER — APIXABAN 5 MG PO TABS
5.0000 mg | ORAL_TABLET | Freq: Two times a day (BID) | ORAL | Status: DC
  Administered 2020-11-17 – 2020-11-18 (×3): 5 mg via ORAL
  Filled 2020-11-16 (×3): qty 1

## 2020-11-16 MED ORDER — EPHEDRINE SULFATE 50 MG/ML IJ SOLN
INTRAMUSCULAR | Status: DC | PRN
  Administered 2020-11-16: 10 mg via INTRAVENOUS
  Administered 2020-11-16: 5 mg via INTRAVENOUS
  Administered 2020-11-16: 10 mg via INTRAVENOUS

## 2020-11-16 MED ORDER — SUCCINYLCHOLINE CHLORIDE 200 MG/10ML IV SOSY
PREFILLED_SYRINGE | INTRAVENOUS | Status: DC | PRN
  Administered 2020-11-16: 100 mg via INTRAVENOUS

## 2020-11-16 MED ORDER — DEXAMETHASONE SODIUM PHOSPHATE 10 MG/ML IJ SOLN
INTRAMUSCULAR | Status: DC | PRN
  Administered 2020-11-16: 5 mg via INTRAVENOUS

## 2020-11-16 MED ORDER — ONDANSETRON HCL 4 MG/2ML IJ SOLN: 4.0000 mg | Freq: Once | INTRAMUSCULAR | Status: DC | PRN

## 2020-11-16 MED ORDER — ONDANSETRON HCL 4 MG/2ML IJ SOLN
INTRAMUSCULAR | Status: AC
  Filled 2020-11-16: qty 2

## 2020-11-16 MED ORDER — BUPIVACAINE-EPINEPHRINE (PF) 0.5% -1:200000 IJ SOLN
INTRAMUSCULAR | Status: DC | PRN
  Administered 2020-11-16: 30 mL via PERINEURAL

## 2020-11-16 MED ORDER — DOCUSATE SODIUM 100 MG PO CAPS
100.0000 mg | ORAL_CAPSULE | Freq: Two times a day (BID) | ORAL | Status: DC
  Administered 2020-11-16 – 2020-11-17 (×2): 100 mg via ORAL
  Filled 2020-11-16 (×2): qty 1

## 2020-11-16 MED ORDER — LIDOCAINE HCL (CARDIAC) PF 100 MG/5ML IV SOSY
PREFILLED_SYRINGE | INTRAVENOUS | Status: DC | PRN
  Administered 2020-11-16: 60 mg via INTRAVENOUS

## 2020-11-16 MED ORDER — FENTANYL CITRATE (PF) 100 MCG/2ML IJ SOLN
INTRAMUSCULAR | Status: AC
  Filled 2020-11-16: qty 2

## 2020-11-16 MED ORDER — ONDANSETRON HCL 4 MG/2ML IJ SOLN: 4.0000 mg | Freq: Four times a day (QID) | INTRAMUSCULAR | Status: DC | PRN

## 2020-11-16 MED ORDER — ONDANSETRON HCL 4 MG PO TABS: 4.0000 mg | ORAL_TABLET | Freq: Four times a day (QID) | ORAL | Status: DC | PRN

## 2020-11-16 MED ORDER — DIPHENHYDRAMINE HCL 12.5 MG/5ML PO ELIX: 12.5000 mg | ORAL_SOLUTION | ORAL | Status: DC | PRN

## 2020-11-16 MED ORDER — FENTANYL CITRATE (PF) 100 MCG/2ML IJ SOLN
INTRAMUSCULAR | Status: AC
  Administered 2020-11-16: 25 ug via INTRAVENOUS
  Filled 2020-11-16: qty 2

## 2020-11-16 MED ORDER — METOCLOPRAMIDE HCL 10 MG PO TABS: 5.0000 mg | ORAL_TABLET | Freq: Three times a day (TID) | ORAL | Status: DC | PRN

## 2020-11-16 MED ORDER — AMOXICILLIN-POT CLAVULANATE 875-125 MG PO TABS: 1.0000 | ORAL_TABLET | Freq: Two times a day (BID) | ORAL | Status: DC

## 2020-11-16 MED ORDER — CEFAZOLIN IN SODIUM CHLORIDE 3-0.9 GM/100ML-% IV SOLN
3.0000 g | Freq: Four times a day (QID) | INTRAVENOUS | Status: AC
  Administered 2020-11-16 – 2020-11-17 (×3): 3 g via INTRAVENOUS
  Filled 2020-11-16 (×3): qty 100

## 2020-11-16 MED ORDER — KETOROLAC TROMETHAMINE 15 MG/ML IJ SOLN: 15.0000 mg | Freq: Once | INTRAMUSCULAR | Status: DC

## 2020-11-16 MED ORDER — KETOROLAC TROMETHAMINE 15 MG/ML IJ SOLN
7.5000 mg | Freq: Four times a day (QID) | INTRAMUSCULAR | Status: AC
  Administered 2020-11-16 – 2020-11-17 (×4): 7.5 mg via INTRAVENOUS
  Filled 2020-11-16 (×4): qty 1

## 2020-11-16 MED ORDER — ONDANSETRON HCL 4 MG/2ML IJ SOLN
INTRAMUSCULAR | Status: DC | PRN
  Administered 2020-11-16: 4 mg via INTRAVENOUS

## 2020-11-16 MED ORDER — SODIUM CHLORIDE 0.9 % IV SOLN: INTRAVENOUS | Status: DC

## 2020-11-16 MED ORDER — BISACODYL 10 MG RE SUPP
10.0000 mg | Freq: Every day | RECTAL | Status: DC | PRN
  Administered 2020-11-18: 10 mg via RECTAL
  Filled 2020-11-16: qty 1

## 2020-11-16 MED ORDER — TRANEXAMIC ACID 1000 MG/10ML IV SOLN
INTRAVENOUS | Status: DC | PRN
  Administered 2020-11-16: 1000 mg via INTRAVENOUS

## 2020-11-16 MED ORDER — ACETAMINOPHEN 10 MG/ML IV SOLN
INTRAVENOUS | Status: AC
  Filled 2020-11-16: qty 100

## 2020-11-16 MED ORDER — FENTANYL CITRATE (PF) 100 MCG/2ML IJ SOLN
INTRAMUSCULAR | Status: DC | PRN
  Administered 2020-11-16 (×2): 25 ug via INTRAVENOUS
  Administered 2020-11-16 (×3): 50 ug via INTRAVENOUS

## 2020-11-16 MED ORDER — GLYCOPYRROLATE 0.2 MG/ML IJ SOLN
INTRAMUSCULAR | Status: DC | PRN
  Administered 2020-11-16: .2 mg via INTRAVENOUS

## 2020-11-16 MED ORDER — FLEET ENEMA 7-19 GM/118ML RE ENEM
1.0000 | ENEMA | Freq: Once | RECTAL | Status: AC | PRN
  Administered 2020-11-18: 1 via RECTAL

## 2020-11-16 MED ORDER — METOCLOPRAMIDE HCL 5 MG/ML IJ SOLN: 5.0000 mg | Freq: Three times a day (TID) | INTRAMUSCULAR | Status: DC | PRN

## 2020-11-16 MED ORDER — TRANEXAMIC ACID 1000 MG/10ML IV SOLN
INTRAVENOUS | Status: AC
  Filled 2020-11-16: qty 10

## 2020-11-16 MED ORDER — LIDOCAINE HCL (PF) 2 % IJ SOLN
INTRAMUSCULAR | Status: AC
  Filled 2020-11-16: qty 5

## 2020-11-16 MED ORDER — DEXAMETHASONE SODIUM PHOSPHATE 10 MG/ML IJ SOLN
INTRAMUSCULAR | Status: AC
  Filled 2020-11-16: qty 1

## 2020-11-16 MED ORDER — PHENYLEPHRINE HCL (PRESSORS) 10 MG/ML IV SOLN
INTRAVENOUS | Status: DC | PRN
  Administered 2020-11-16: 100 ug via INTRAVENOUS

## 2020-11-16 MED ORDER — ROCURONIUM BROMIDE 10 MG/ML (PF) SYRINGE
PREFILLED_SYRINGE | INTRAVENOUS | Status: AC
  Filled 2020-11-16: qty 10

## 2020-11-16 MED ORDER — ACETAMINOPHEN 325 MG PO TABS: 325.0000 mg | ORAL_TABLET | Freq: Four times a day (QID) | ORAL | Status: DC | PRN

## 2020-11-16 MED ORDER — ACETAMINOPHEN 500 MG PO TABS
500.0000 mg | ORAL_TABLET | Freq: Four times a day (QID) | ORAL | Status: AC
Start: 2020-11-16 — End: 2020-11-17
  Administered 2020-11-16 (×2): 500 mg via ORAL
  Filled 2020-11-16 (×4): qty 1

## 2020-11-16 MED ORDER — SUCCINYLCHOLINE CHLORIDE 200 MG/10ML IV SOSY
PREFILLED_SYRINGE | INTRAVENOUS | Status: AC
  Filled 2020-11-16: qty 10

## 2020-11-16 MED ORDER — 0.9 % SODIUM CHLORIDE (POUR BTL) OPTIME
TOPICAL | Status: DC | PRN
  Administered 2020-11-16: 1000 mL

## 2020-11-16 MED ORDER — PROPOFOL 10 MG/ML IV BOLUS
INTRAVENOUS | Status: DC | PRN
  Administered 2020-11-16: 80 mg via INTRAVENOUS

## 2020-11-16 MED ORDER — ROCURONIUM BROMIDE 100 MG/10ML IV SOLN
INTRAVENOUS | Status: DC | PRN
  Administered 2020-11-16: 20 mg via INTRAVENOUS
  Administered 2020-11-16: 50 mg via INTRAVENOUS
  Administered 2020-11-16: 20 mg via INTRAVENOUS

## 2020-11-16 MED ORDER — EPHEDRINE 5 MG/ML INJ
INTRAVENOUS | Status: AC
  Filled 2020-11-16: qty 10

## 2020-11-16 MED ORDER — LACTATED RINGERS IV SOLN: INTRAVENOUS | Status: DC | PRN

## 2020-11-16 MED ORDER — SUGAMMADEX SODIUM 500 MG/5ML IV SOLN
INTRAVENOUS | Status: DC | PRN
  Administered 2020-11-16: 500 mg via INTRAVENOUS

## 2020-11-16 MED ORDER — AMOXICILLIN-POT CLAVULANATE 875-125 MG PO TABS
1.0000 | ORAL_TABLET | Freq: Two times a day (BID) | ORAL | Status: DC
  Administered 2020-11-17 – 2020-11-18 (×2): 1 via ORAL
  Filled 2020-11-16 (×2): qty 1

## 2020-11-16 SURGICAL SUPPLY — 72 items
APL PRP STRL LF DISP 70% ISPRP (MISCELLANEOUS) ×2
BASEPLATE BOSS DRILL (MISCELLANEOUS) ×2 IMPLANT
BIT DRILL 2.5 (BIT) ×2
BIT DRILL 2.5X4.5XSCR (BIT) ×1 IMPLANT
BIT DRILL BASEPLATE CENTRAL S (BIT) ×2 IMPLANT
BIT DRL 2.5X4.5XSCR (BIT) ×1
BLADE SAW SAG 25X90X1.19 (BLADE) ×2 IMPLANT
BNDG COHESIVE 4X5 TAN ST LF (GAUZE/BANDAGES/DRESSINGS) ×2 IMPLANT
BSPLAT GLND THK2 22.8X27.3 (Plate) ×1 IMPLANT
CHLORAPREP W/TINT 26 (MISCELLANEOUS) ×4 IMPLANT
COOLER POLAR GLACIER W/PUMP (MISCELLANEOUS) ×2 IMPLANT
COVER BACK TABLE REUSABLE LG (DRAPES) ×2 IMPLANT
DRAPE 3/4 80X56 (DRAPES) ×4 IMPLANT
DRAPE IMP U-DRAPE 54X76 (DRAPES) ×4 IMPLANT
DRAPE INCISE IOBAN 66X45 STRL (DRAPES) ×4 IMPLANT
DRSG OPSITE POSTOP 4X8 (GAUZE/BANDAGES/DRESSINGS) ×2 IMPLANT
ELECT BLADE 6.5 EXT (BLADE) ×2 IMPLANT
ELECT CAUTERY BLADE 6.4 (BLADE) ×2 IMPLANT
ELECT REM PT RETURN 9FT ADLT (ELECTROSURGICAL) ×2
ELECTRODE REM PT RTRN 9FT ADLT (ELECTROSURGICAL) ×1 IMPLANT
GAUZE XEROFORM 1X8 LF (GAUZE/BANDAGES/DRESSINGS) ×2 IMPLANT
GLENOSPHERE RSS 2 CONCENTRIC (Shoulder) ×2 IMPLANT
GLOVE SRG 8 PF TXTR STRL LF DI (GLOVE) ×2 IMPLANT
GLOVE SURG ENC MOIS LTX SZ7.5 (GLOVE) ×8 IMPLANT
GLOVE SURG ENC MOIS LTX SZ8 (GLOVE) ×8 IMPLANT
GLOVE SURG PR MICRO ENCORE 7.5 (GLOVE) ×2 IMPLANT
GLOVE SURG UNDER LTX SZ8 (GLOVE) ×2 IMPLANT
GLOVE SURG UNDER POLY LF SZ8 (GLOVE) ×4
GOWN STRL REUS W/ TWL LRG LVL3 (GOWN DISPOSABLE) ×1 IMPLANT
GOWN STRL REUS W/ TWL XL LVL3 (GOWN DISPOSABLE) ×1 IMPLANT
GOWN STRL REUS W/TWL LRG LVL3 (GOWN DISPOSABLE) ×2
GOWN STRL REUS W/TWL XL LVL3 (GOWN DISPOSABLE) ×2
GUIDE PIN 2.0 X 150 (WIRE) ×2 IMPLANT
ILLUMINATOR WAVEGUIDE N/F (MISCELLANEOUS) IMPLANT
IV NS IRRIG 3000ML ARTHROMATIC (IV SOLUTION) ×2 IMPLANT
KIT STABILIZATION SHOULDER (MISCELLANEOUS) ×2 IMPLANT
KIT TURNOVER KIT A (KITS) ×2 IMPLANT
MANIFOLD NEPTUNE II (INSTRUMENTS) ×2 IMPLANT
MASK FACE SPIDER DISP (MASK) ×2 IMPLANT
MAT ABSORB  FLUID 56X50 GRAY (MISCELLANEOUS) ×2
MAT ABSORB FLUID 56X50 GRAY (MISCELLANEOUS) ×1 IMPLANT
NDL SAFETY ECLIPSE 18X1.5 (NEEDLE) ×1 IMPLANT
NEEDLE HYPO 18GX1.5 SHARP (NEEDLE) ×2
NEEDLE HYPO 22GX1.5 SAFETY (NEEDLE) ×2 IMPLANT
NEEDLE SPNL 20GX3.5 QUINCKE YW (NEEDLE) ×2 IMPLANT
NS IRRIG 500ML POUR BTL (IV SOLUTION) ×2 IMPLANT
PACK ARTHROSCOPY SHOULDER (MISCELLANEOUS) ×2 IMPLANT
PAD ARMBOARD 7.5X6 YLW CONV (MISCELLANEOUS) ×16 IMPLANT
PAD WRAPON POLAR SHDR UNIV (MISCELLANEOUS) ×1 IMPLANT
PENCIL SMOKE EVACUATOR (MISCELLANEOUS) ×2 IMPLANT
PLATE BASE REVERSE RSS S (Plate) ×2 IMPLANT
PULSAVAC PLUS IRRIG FAN TIP (DISPOSABLE) ×2
SCREW 4.5X15 RSS W CAP (Screw) ×4 IMPLANT
SCREW 4.5X20 RSS W CAP (Screw) ×4 IMPLANT
SCREW 4.5X35 RSS W CAP (Screw) ×2 IMPLANT
SCREW BODY REVERSE STD (Screw) ×2 IMPLANT
SLING ULTRA II M (MISCELLANEOUS) ×2 IMPLANT
SPONGE T-LAP 18X18 ~~LOC~~+RFID (SPONGE) ×6 IMPLANT
STAPLER SKIN PROX 35W (STAPLE) ×2 IMPLANT
STEM PRESS FIT SZ 10 TSS (Stem) ×2 IMPLANT
SUT ETHIBOND 0 MO6 C/R (SUTURE) ×2 IMPLANT
SUT FIBERWIRE #2 38 BLUE 1/2 (SUTURE) ×6
SUT VIC AB 0 CT1 36 (SUTURE) ×2 IMPLANT
SUT VIC AB 2-0 CT1 27 (SUTURE) ×4
SUT VIC AB 2-0 CT1 TAPERPNT 27 (SUTURE) ×2 IMPLANT
SUTURE FIBERWR #2 38 BLUE 1/2 (SUTURE) ×3 IMPLANT
SYR 10ML LL (SYRINGE) ×2 IMPLANT
SYR 30ML LL (SYRINGE) ×2 IMPLANT
TIP FAN IRRIG PULSAVAC PLUS (DISPOSABLE) ×1 IMPLANT
TOWEL OR 17X26 4PK STRL BLUE (TOWEL DISPOSABLE) ×2 IMPLANT
WATER STERILE IRR 500ML POUR (IV SOLUTION) ×2 IMPLANT
WRAPON POLAR PAD SHDR UNIV (MISCELLANEOUS) ×2

## 2020-11-16 NOTE — Op Note (Signed)
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  Post-Op Diagnosis:   Same.  Procedure:   Reverse right total shoulder arthroplasty.  Surgeon:   Pascal Lux, MD  Assistant:   Cameron Proud, PA-C; Rennis Chris, PA-S  Anesthesia:   GET  Findings:   As above.  Complications:   None  EBL:   175 cc  Fluids:   600 cc crystalloid  UOP:   None  TT:   None  Drains:   None  Closure:   Staples  Implants:   All press-fit Integra system with a 10 mm stem, a standard metaphyseal body, a +3 mm humeral platform, a mini baseplate, and a 38 mm concentric +2 mm laterally offset glenosphere.  Brief Clinical Note:   The patient is a 70 year old female with a history of recurrent anterior dislocations with minimal trauma. The shoulder underwent a closed reduction under anesthesia 5 days ago. Several days later, she again started complaining of pain in the shoulder. A repeat x-ray demonstrated that the shoulder again was dislocated despite being in a shoulder immobilizer. The patient presents at this time for a reverse right total shoulder arthroplasty.  Procedure:   The patient was brought into the operating room and lain in the supine position. The patient underwent general endotracheal intubation and anesthesia before she was repositioned in the beach chair position using the beach chair positioner. The right shoulder and upper extremity were prepped with ChloraPrep solution before being draped sterilely. Preoperative antibiotics were administered.   A timeout was performed to verify the appropriate surgical site before a standard anterior approach to the shoulder was made through an approximately 5-6 inch incision. The incision was carried down through the subcutaneous tissues to expose the deltopectoral fascia. The interval between the deltoid and pectoralis  muscles was identified and this plane developed, retracting the cephalic vein laterally with the deltoid muscle. The conjoined tendon was identified. Its lateral margin was dissected and the Kolbel self-retraining retractor inserted. The "three sisters" were identified and cauterized. Bursal tissues were removed to improve visualization. The subscapularis tendon was released from its attachment to the lesser tuberosity 1 cm proximal to its insertion and several tagging sutures placed. The inferior capsule was released with care after identifying and protecting the axillary nerve. The proximal humeral cut was made at approximately 25 of retroversion using the extra-medullary guide.   Attention was redirected to the glenoid. The labrum was debrided circumferentially before the center of the glenoid was identified. The guidewire was drilled into the glenoid neck using the appropriate guide. After verifying its position, it was overreamed with the mini-baseplate reamer to create a flat surface before the stem reamer was utilized. The superior and inferior peg sites were reamed using the appropriate guide to complete the glenoid preparation. The permanent mini-baseplate was impacted into place. It was stabilized with a 15 x 4.5 mm central screw and four peripheral screws. Locking caps were placed over the superior and inferior screws. The permanent 38 mm concentric glenosphere with +2 mm of lateral offset was then impacted into place and its Morse taper locking mechanism verified using manual distraction.  Attention was directed to the humeral side. The humeral canal was prepared utilizing the tapered stem reamers sequentially beginning with the 6 mm stem and progressing to a 10 mm stem. This demonstrated a good tight fit. The metaphyseal region  was then prepared using the appropriate planar device. The trial stem and standard metaphyseal body were put together on the back table and a trial reduction performed using  the +0 mm and +3 mm inserts. With the +3 mm insert, the arm demonstrated excellent range of motion as the hand could be brought across the chest to the opposite shoulder and brought to the top of the patient's head and to the patient's ear. The shoulder appeared stable throughout this range of motion. The joint was dislocated and the trial components removed. The permanent 10 mm stem with the standard body was impacted into place with care taken to maintain the appropriate version. A repeat trial reduction with the +3 mm insert again demonstrated excellent stability with the findings as described above. Therefore, the shoulder was re-dislocated and, after inserting the locking screw to secure the body to the stem, the permanent +3 mm insert impacted into place. After verifying its locking mechanism, the shoulder was relocated using two finger pressure and again placed through a range of motion with the findings as described above.  The wound was copiously irrigated with sterile saline solution using the jet lavage system before a total of 20 cc of Exparel diluted out to 60 cc with normal saline and 30 cc of 0.5% Sensorcaine with epinephrine was injected into the pericapsular and peri-incisional tissues to help with postoperative analgesia. The subscapularis tendon was reapproximated using #2 FiberWire interrupted sutures. The deltopectoral interval was closed using #0 Vicryl interrupted sutures before the subcutaneous tissues were closed using 2-0 Vicryl interrupted sutures. The skin was closed using staples. Prior to closing the skin, 1 g of transexemic acid in 10 cc of normal saline was injected intra-articularly to help with postoperative bleeding. A sterile occlusive dressing was applied to the wound before the arm was placed into a shoulder immobilizer with an abduction pillow. A Polar Care system also was applied to the shoulder. The patient was then transferred back to a hospital bed before being awakened,  extubated, and returned to the recovery room in satisfactory condition after tolerating the procedure well.

## 2020-11-16 NOTE — Chronic Care Management (AMB) (Signed)
  Care Management   Note  11/16/2020 Name: Tina Pacheco MRN: [id number] DOB: September 26, 1950  Tina Pacheco is a 70 y.o. year old female who is a primary care patient of Venia Carbon, MD and is actively engaged with the care management team. I reached out to [name] by phone today to assist with re-scheduling a follow up visit with the RN Case Manager  Follow up plan: Unsuccessful telephone outreach attempt made. A HIPAA compliant phone message was left for the patient providing contact information and requesting a return call.  Pt LM that she was admitted and wanted to cancel 11/19/2020 phone appt - LM to reschedule   Julian Hy, [place] Management  Direct Dial: [phone]

## 2020-11-16 NOTE — Anesthesia Preprocedure Evaluation (Signed)
Anesthesia Evaluation  Patient identified by MRN, date of birth, ID band Patient awake    Reviewed: Allergy & Precautions, H&P , NPO status , Patient's Chart, lab work & pertinent test results, reviewed documented beta blocker date and time   Airway Mallampati: III  TM Distance: >3 FB Neck ROM: full    Dental  (+) Teeth Intact   Pulmonary asthma , sleep apnea , COPD,  COPD inhaler,    Pulmonary exam normal        Cardiovascular Exercise Tolerance: Poor hypertension, On Medications + angina with exertion + CAD  Normal cardiovascular exam Rhythm:regular Rate:Normal     Neuro/Psych  Headaches, PSYCHIATRIC DISORDERS Anxiety  Neuromuscular disease    GI/Hepatic negative GI ROS, Neg liver ROS,   Endo/Other  negative endocrine ROS  Renal/GU Renal disease  negative genitourinary   Musculoskeletal   Abdominal   Peds  Hematology negative hematology ROS (+)   Anesthesia Other Findings Past Medical History: No date: Arthritis     Comment:  RA No date: Asthma No date: Basal cell carcinoma 2018: Cataract     Comment:  bilateral eyes; corrected with surgery No date: Claustrophobia No date: Collagen vascular disease ([place])     Comment:  RA No date: COPD (chronic obstructive pulmonary disease) (HCC) No date: Diastolic dysfunction     Comment:  a. 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 cardiac cath 05/24/2010 - nonobstructive CAD No date: History of gout 2003: Hyperplastic colonic polyp No date: Hypertension No date: Hypokalemia No date: Morbid obesity ([place]) No date: PAF (paroxysmal atrial fibrillation) (HCC)     Comment:  a. on Pradaxa; b. CHADSVASc at least 2 (HTN & female) No date: Rheumatoid arthritis(714.0) No date:  Sleep apnea     Comment:  a. not compliant with CPAP Past Surgical History: No date: ABDOMINAL HYSTERECTOMY 08/28/6376: APPLICATION OF WOUND VAC; Right     Comment:  Procedure: APPLICATION OF WOUND VAC;  Surgeon: Albertine Patricia, DPM;  Location: [facility];  Service: Podiatry;                Laterality: Right; 05/24/2010: CARDIAC CATHETERIZATION     Comment:  nonobstructive CAD 06/12/2016: CATARACT EXTRACTION W/ INTRAOCULAR LENS IMPLANT; Right     Comment:  Dr. Darleen Crocker 06/26/2016: CATARACT EXTRACTION W/ INTRAOCULAR LENS IMPLANT; Left     Comment:  Dr. Darleen Crocker No date: CESAREAN SECTION No date: CHOLECYSTECTOMY 06/12/2011: COLONOSCOPY     Comment:  Procedure: COLONOSCOPY;  Surgeon: Juanita Craver, MD;                Location: [facility];  Service: Endoscopy;  Laterality:              N/A; 03/17/2013: COLONOSCOPY; N/A     Comment:  Procedure: COLONOSCOPY;  Surgeon: Juanita Craver, MD;                Location: [facility];  Service: Endoscopy;  Laterality:              N/A; No date: EYE SURGERY 07/13/2017: FOOT ARTHRODESIS; Right     Comment:  Procedure:  ARTHRODESIS FOOT-MULTI.FUSIONS (6 JOINTS);                Surgeon: Albertine Patricia, DPM;  Location: [facility];                Service: Podiatry;  Laterality: Right; 08/09/2017: IRRIGATION AND DEBRIDEMENT FOOT; Right     Comment:  Procedure: IRRIGATION AND DEBRIDEMENT FOOT;  Surgeon:               Albertine Patricia, DPM;  Location: [facility];  Service:               Podiatry;  Laterality: Right; No date: KNEE ARTHROSCOPY     Comment:  bilateral 06/04/2020: LEFT HEART CATH AND CORONARY ANGIOGRAPHY; N/A     Comment:  Procedure: LEFT HEART CATH AND CORONARY ANGIOGRAPHY;                Surgeon: Wellington Hampshire, MD;  Location: [place]               CV LAB;  Service: Cardiovascular;  Laterality: N/A; 11/11/2020: SHOULDER CLOSED REDUCTION; Right     Comment:  Procedure: CLOSED REDUCTION SHOULDER;  Surgeon: Corky Mull, MD;  Location: [facility];  Service: Orthopedics;                Laterality: Right; 11/11/2020: SHOULDER INJECTION; Right     Comment:  Procedure: SHOULDER INJECTION;  Surgeon: Corky Mull,               MD;  Location: [facility];  Service: Orthopedics;                Laterality: Right; No date: SINUSOTOMY 12/2016: TOOTH EXTRACTION No date: VAGINAL HYSTERECTOMY BMI    Body Mass Index: 51.35 kg/m     Reproductive/Obstetrics negative OB ROS                             Anesthesia Physical Anesthesia Plan  ASA: 4  Anesthesia Plan: General ETT   Post-op Pain Management:    Induction:   PONV Risk Score and Plan: 4 or greater  Airway Management Planned:   Additional Equipment:   Intra-op Plan:   Post-operative Plan:   Informed Consent: I have reviewed the patients History and Physical, chart, labs and discussed the procedure including the risks, benefits and alternatives for the proposed anesthesia with the patient or authorized representative who has indicated his/her understanding and acceptance.     Dental Advisory Given  Plan Discussed with: CRNA  Anesthesia Plan Comments: (Pt is high risk for above with baseline pulmonary status requiring supplemental oxygen.  I have concerns regarding isnb and phrenic nerve involvement and worsening post op vent. Status.  Pt is  High risk for requiring post op ventilation at baseline. This was discussed with the patient. ja)        Anesthesia Quick Evaluation

## 2020-11-16 NOTE — Transfer of Care (Signed)
Immediate Anesthesia Transfer of Care Note  Patient: Tina Pacheco  Procedure(s) Performed: REVERSE SHOULDER ARTHROPLASTY (Right: Shoulder)  Patient Location: PACU  Anesthesia Type:General  Level of Consciousness: awake, alert  and oriented  Airway & Oxygen Therapy: Patient Spontanous Breathing and Patient connected to face mask oxygen  Post-op Assessment: Report given to RN and Post -op Vital signs reviewed and stable  Post vital signs: Reviewed and stable  Last Vitals:  Vitals Value Taken Time  BP    Temp    Pulse    Resp    SpO2      Last Pain:  Vitals:   11/16/20 1147  TempSrc: Oral  PainSc: 8       Patients Stated Pain Goal: 3 (27/61/47 0929)  Complications: No notable events documented.

## 2020-11-16 NOTE — Plan of Care (Signed)

## 2020-11-16 NOTE — Progress Notes (Signed)
OT Cancellation Note  Patient Details Name: KELLAN BOEHLKE MRN: [id number] DOB: [birth year]   Cancelled Treatment:    Reason Eval/Treat Not Completed: Other (comment). Per chart review, pt scheduled for reverse total R shoulder replacement today. OT to hold for surgical intervention.  Darleen Crocker, [place], OTR/L , CBIS ascom [phone]  11/16/20, 8:05 AM

## 2020-11-16 NOTE — Anesthesia Postprocedure Evaluation (Signed)
Anesthesia Post Note  Patient: Tina Pacheco  Procedure(s) Performed: REVERSE SHOULDER ARTHROPLASTY (Right: Shoulder)  Patient location during evaluation: PACU Anesthesia Type: General Level of consciousness: awake and alert Pain management: pain level controlled Vital Signs Assessment: post-procedure vital signs reviewed and stable Respiratory status: spontaneous breathing, nonlabored ventilation, respiratory function stable and patient connected to nasal cannula oxygen Cardiovascular status: blood pressure returned to baseline and stable Postop Assessment: no apparent nausea or vomiting Anesthetic complications: no   No notable events documented.   Last Vitals:  Vitals:   11/16/20 1645 11/16/20 1646  BP: (!) 145/65   Pulse: 69   Resp: 15   Temp:  (!) 36.4 C  SpO2: (!) 87%     Last Pain:  Vitals:   11/16/20 1748  TempSrc:   PainSc: [place]

## 2020-11-16 NOTE — Progress Notes (Signed)
Progress Note    Tina Pacheco  QMG:[id number] DOB: 1950/09/28  DOA: 11/10/2020 PCP: Venia Carbon, MD      Brief Narrative:    Medical records reviewed and are as summarized below:  Tina Pacheco is a 70 y.o. female with medical history significant for Rheumatoid arthritis on Arava, CAD, paroxysmal A. fib on Eliquis, chronic diastolic heart failure, HTN, CKD stage IIIa, COPD, sleep apnea, morbid obesity with BMI of 50 who presents to the ED on referral from [facility], for management of right shoulder dislocation.  She underwent reduction of right shoulder dislocation the operative on 11/11/2020.  However, she continued to have right shoulder pain so she was taken back to the operating room on 11/16/2020 for reverse right total shoulder arthroplasty.  She complained of persistent cough.  Chest x-ray was concerning for suspected left midlung pneumonia.  She was started on Augmentin for this.    Assessment/Plan:   Principal Problem:   Closed dislocation of right shoulder Active Problems:   Long term current use of anticoagulant therapy   Morbid obesity with BMI of 50.0-59.9, adult (HCC)   HTN (hypertension)   CAD (coronary artery disease)   Rheumatoid arthritis (HCC)   Paroxysmal A-fib (HCC)   Sleep apnea   COPD (chronic obstructive pulmonary disease) (HCC)   Decubitus ulcer of sacral region, stage 2 (HCC)    Body mass index is 51.35 kg/m.  (Morbid obesity)   Nontraumatic close dislocation of right shoulder, fracture of right posterior superior humeral head, severe subacromial/subdeltoid bursitis: S/p reduction of right shoulder dislocation in the OR on 11/11/2020.  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  Continue analgesics as needed for pain.  Follow-up with orthopedic surgeon.  Cough, suspected left midlung pneumonia: Chest x-ray done on 11/13/2020 showed infiltrate in the left midlung.  Lactic acid and procalcitonin levels were normal.   No growth on cultures thus far.  Plan to treat with Augmentin for total of 5 days.  Paroxysmal atrial fibrillation: Continue Eliquis, metoprolol and amiodarone  Chronic diastolic CHF: Compensated.  Continue diuretics  Rheumatoid arthritis: Continue Arava  Other comorbidities include hypertension, CAD, OSA, CKD stage IIIa  Awaiting placement to SNF   Diet Order             Diet NPO time specified Except for: Sips with Meds  Diet effective midnight                      Consultants: Orthopedic surgeon  Procedures: S/p reduction of shoulder right shoulder dislocation in the OR on 11/11/2020 S/p reverse right total shoulder arthroplasty on 11/16/2020   Medications:    amiodarone  200 mg Oral Daily   amoxicillin-clavulanate  1 tablet Oral Q12H   apixaban  5 mg Oral BID   atorvastatin  20 mg Oral PC supper   benzonatate  100 mg Oral TID   docusate sodium  100 mg Oral BID   gabapentin  300 mg Oral QID   leflunomide  20 mg Oral Daily   metoprolol succinate  50 mg Oral Daily   montelukast  10 mg Oral QHS   oxyCODONE  20 mg Oral BID   potassium chloride SA  20 mEq Oral Q breakfast   spironolactone  25 mg Oral Daily   terazosin  10 mg Oral QHS   torsemide  40 mg Oral Daily   Continuous Infusions:   ceFAZolin (ANCEF) IV  Anti-infectives (From admission, onward)    Start     Dose/Rate Route Frequency Ordered Stop   11/16/20 2200  amoxicillin-clavulanate (AUGMENTIN) 875-125 MG per tablet 1 tablet        1 tablet Oral Every 12 hours 11/16/20 1042     11/16/20 1130  amoxicillin-clavulanate (AUGMENTIN) 875-125 MG per tablet 1 tablet  Status:  Discontinued        1 tablet Oral Every 12 hours 11/16/20 1037 11/16/20 1042   11/16/20 0600  ceFAZolin (ANCEF) IVPB 3g/100 mL premix        3 g 200 mL/hr over 30 Minutes Intravenous 30 min pre-op 11/15/20 1530     11/11/20 1203  ceFAZolin (ANCEF) 2-4 GM/100ML-% IVPB       Note to Pharmacy: Lyman Bishop   : cabinet  override      11/11/20 1203 11/12/20 0014   11/11/20 0800  ceFAZolin (ANCEF) IVPB 2g/100 mL premix  Status:  Discontinued        2 g 200 mL/hr over 30 Minutes Intravenous 30 min pre-op 11/10/20 2016 11/11/20 1703              Family Communication/Anticipated D/C date and plan/Code Status   DVT prophylaxis: Foot Pump / plexipulse Start: 11/11/20 1704 apixaban (ELIQUIS) tablet 5 mg     Code Status: Full Code  Family Communication: None Disposition Plan:    Status is: Inpatient  Remains inpatient appropriate because:Unsafe d/c plan and Inpatient level of care appropriate due to severity of illness  Dispo: The patient is from: Home              Anticipated d/c is to: Home              Patient currently is not medically stable to d/c.   Difficult to place patient No                 Subjective:   C/o right shoulder pain.  She still has a cough but no shortness of breath or chest pain.  She was seen this morning before surgery today.  Objective:    Vitals:   11/15/20 1559 11/15/20 2155 11/16/20 0626 11/16/20 0926  BP: (!) 159/86 (!) 134/59 (!) 110/46 (!) 138/52  Pulse: (!) 54 (!) 49 (!) 49 (!) 55  Resp: 14 16  15   Temp: 97.9 F (36.6 C) 98.1 F (36.7 C) 98.4 F (36.9 C) 97.8 F (36.6 C)  TempSrc:      SpO2: 98% 92% 94% 94%  Weight:      Height:       No data found.   Intake/Output Summary (Last 24 hours) at 11/16/2020 1042 Last data filed at 11/16/2020 0925 Gross per 24 hour  Intake 240 ml  Output 1400 ml  Net -1160 ml   Filed Weights   11/10/20 1700 11/11/20 1153  Weight: 131.5 kg 131.5 kg    Exam:  GEN: NAD SKIN: Warm and dry.  Stage II sacral decubitus ulcer.  Erythematous areas on the lower abdomen. EYES: No pallor or icterus ENT: MMM CV: RRR PULM: CTA B ABD: soft, obese, NT, +BS CNS: AAO x 3, non focal EXT: Right shoulder tenderness.  Right arm is immobilized in a sling.            Data Reviewed:   I have  personally reviewed following labs and imaging studies:  Labs: Labs show the following:   Basic Metabolic Panel: Recent Labs  Lab 11/10/20 2012 11/14/20 6314  NA 138 137  K 3.9 3.7  CL 103 99  CO2 25 31  GLUCOSE 88 117*  BUN 28* 37*  CREATININE 1.19* 1.24*  CALCIUM 10.2 9.7   GFR Estimated Creatinine Clearance: 56 mL/min (A) (by C-G formula based on SCr of 1.24 mg/dL (H)). Liver Function Tests: No results for input(s): AST, ALT, ALKPHOS, BILITOT, PROT, ALBUMIN in the last 168 hours. No results for input(s): LIPASE, AMYLASE in the last 168 hours. No results for input(s): AMMONIA in the last 168 hours. Coagulation profile No results for input(s): INR, PROTIME in the last 168 hours.  CBC: Recent Labs  Lab 11/10/20 2012 11/13/20 1258  WBC 7.0 7.3  NEUTROABS 4.2 5.1  HGB 12.7 12.5  HCT 39.4 38.4  MCV 94.3 93.2  PLT 165 186   Cardiac Enzymes: No results for input(s): CKTOTAL, CKMB, CKMBINDEX, TROPONINI in the last 168 hours. BNP (last 3 results) No results for input(s): PROBNP in the last 8760 hours. CBG: No results for input(s): GLUCAP in the last 168 hours. D-Dimer: No results for input(s): DDIMER in the last 72 hours. Hgb A1c: No results for input(s): HGBA1C in the last 72 hours. Lipid Profile: No results for input(s): CHOL, HDL, LDLCALC, TRIG, CHOLHDL, LDLDIRECT in the last 72 hours. Thyroid function studies: No results for input(s): TSH, T4TOTAL, T3FREE, THYROIDAB in the last 72 hours.  Invalid input(s): FREET3 Anemia work up: No results for input(s): VITAMINB12, FOLATE, FERRITIN, TIBC, IRON, RETICCTPCT in the last 72 hours. Sepsis Labs: Recent Labs  Lab 11/10/20 2012 11/13/20 1258 11/14/20 0613 11/15/20 0626  PROCALCITON  --  <0.10 <0.10 <0.10  WBC 7.0 7.3  --   --   LATICACIDVEN  --  1.9  --   --     Microbiology Recent Results (from the past 240 hour(s))  Resp Panel by RT-PCR (Flu A&B, Covid) Nasopharyngeal Swab     Status: None   Collection  Time: 11/10/20  7:49 PM   Specimen: Nasopharyngeal Swab; Nasopharyngeal(NP) swabs in vial transport medium  Result Value Ref Range Status   SARS Coronavirus 2 by RT PCR NEGATIVE NEGATIVE Final    Comment: (NOTE) SARS-CoV-2 target nucleic acids are NOT DETECTED.  The SARS-CoV-2 RNA is generally detectable in upper respiratory specimens during the acute phase of infection. The lowest concentration of SARS-CoV-2 viral copies this assay can detect is 138 copies/mL. A negative result does not preclude SARS-Cov-2 infection and should not be used as the sole basis for treatment or other patient management decisions. A negative result may occur with  improper specimen collection/handling, submission of specimen other than nasopharyngeal swab, presence of viral mutation(s) within the areas targeted by this assay, and inadequate number of viral copies(<138 copies/mL). A negative result must be combined with clinical observations, patient history, and epidemiological information. The expected result is Negative.  Fact Sheet for Patients:  EntrepreneurPulse.com.au  Fact Sheet for Healthcare Providers:  IncredibleEmployment.be  This test is no t yet approved or cleared by the [place] FDA and  has been authorized for detection and/or diagnosis of SARS-CoV-2 by FDA under an Emergency Use Authorization (EUA). This EUA will remain  in effect (meaning this test can be used) for the duration of the COVID-19 declaration under Section 564(b)(1) of the Act, 21 U.S.C.section 360bbb-3(b)(1), unless the authorization is terminated  or revoked sooner.       Influenza A by PCR NEGATIVE NEGATIVE Final   Influenza B by PCR NEGATIVE NEGATIVE Final    Comment: (NOTE) The  Xpert Xpress SARS-CoV-2/FLU/RSV plus assay is intended as an aid in the diagnosis of influenza from Nasopharyngeal swab specimens and should not be used as a sole basis for treatment. Nasal washings  and aspirates are unacceptable for Xpert Xpress SARS-CoV-2/FLU/RSV testing.  Fact Sheet for Patients: EntrepreneurPulse.com.au  Fact Sheet for Healthcare Providers: IncredibleEmployment.be  This test is not yet approved or cleared by the [place] FDA and has been authorized for detection and/or diagnosis of SARS-CoV-2 by FDA under an Emergency Use Authorization (EUA). This EUA will remain in effect (meaning this test can be used) for the duration of the COVID-19 declaration under Section 564(b)(1) of the Act, 21 U.S.C. section 360bbb-3(b)(1), unless the authorization is terminated or revoked.  Performed at [facility], [address] [place], [place] [address]   CULTURE, BLOOD (ROUTINE X 2) w Reflex to ID Panel     Status: None (Preliminary result)   Collection Time: 11/13/20  1:01 PM   Specimen: BLOOD LEFT HAND  Result Value Ref Range Status   Specimen Description BLOOD LEFT HAND  Final   Special Requests   Final    BOTTLES DRAWN AEROBIC AND ANAEROBIC Blood Culture results may not be optimal due to an inadequate volume of blood received in culture bottles   Culture   Final    NO GROWTH 3 DAYS Performed at [facility], [address], [place], [place] [address]    Report Status PENDING  Incomplete  CULTURE, BLOOD (ROUTINE X 2) w Reflex to ID Panel     Status: None (Preliminary result)   Collection Time: 11/13/20  1:03 PM   Specimen: BLOOD  Result Value Ref Range Status   Specimen Description BLOOD BLOOD LEFT WRIST  Final   Special Requests   Final    BOTTLES DRAWN AEROBIC ONLY Blood Culture results may not be optimal due to an inadequate volume of blood received in culture bottles   Culture   Final    NO GROWTH 3 DAYS Performed at [facility], [address], [place], [place] [address]    Report Status PENDING  Incomplete    Procedures and diagnostic studies:  No results  found.             LOS: 5 days   Jazier Mcglamery  [facility]   Pager on [email or web address]. If 7PM-7AM, please contact night-coverage at [email or web address]     11/16/2020, 10:42 AM

## 2020-11-16 NOTE — Anesthesia Procedure Notes (Signed)
Procedure Name: Intubation Date/Time: 11/16/2020 1:17 PM Performed by: Aline Brochure, CRNA Pre-anesthesia Checklist: Patient identified, Patient being monitored, Timeout performed, Emergency Drugs available and Suction available Patient Re-evaluated:Patient Re-evaluated prior to induction Oxygen Delivery Method: Circle system utilized Preoxygenation: Pre-oxygenation with 100% oxygen Induction Type: IV induction Ventilation: Mask ventilation without difficulty and Two handed mask ventilation required Laryngoscope Size: 3 and McGraph Grade View: Grade I Tube type: Oral Tube size: 7.0 mm Number of attempts: 1 Airway Equipment and Method: Stylet and Video-laryngoscopy Placement Confirmation: ETT inserted through vocal cords under direct vision, positive ETCO2 and breath sounds checked- equal and bilateral Secured at: 21 cm Tube secured with: Tape Dental Injury: Teeth and Oropharynx as per pre-operative assessment

## 2020-11-16 NOTE — Progress Notes (Signed)
PT Cancellation Note  Patient Details Name: Tina Pacheco MRN: [id number] DOB: 12-Mar-1950   Cancelled Treatment:    Reason Eval/Treat Not Completed: Patient at procedure or test/unavailable Pt out of room having reverse total shoulder replacement.  PT held this date.  Kreg Shropshire, DPT 11/16/2020, 12:42 PM

## 2020-11-16 NOTE — Progress Notes (Signed)
  Chaplain On-Call received a call from [facility] to visit the patient. The patient wishes to complete the Advance Directives documents which this Chaplain had provided to her last week.  Chaplain assisted the patient in understanding the intent of the documents.  The patient identified her daughter to be her 15, and also wants to do her Living Will.  Chaplain was unable to complete the process due to two Volunteers not being available to serve as Witnesses.  Chaplain asked the Afternoon Chaplain to follow-up.  Chaplain Pollyann Samples M.Div., [facility]

## 2020-11-17 ENCOUNTER — Encounter: Payer: Self-pay | Admitting: Surgery

## 2020-11-17 DIAGNOSIS — S43004D Unspecified dislocation of right shoulder joint, subsequent encounter: Secondary | ICD-10-CM

## 2020-11-17 LAB — CBC
HCT: 35.2 % — ABNORMAL LOW (ref 36.0–46.0)
Hemoglobin: 11.5 g/dL — ABNORMAL LOW (ref 12.0–15.0)
MCH: 30.3 pg (ref 26.0–34.0)
MCHC: 32.7 g/dL (ref 30.0–36.0)
MCV: 92.6 fL (ref 80.0–100.0)
Platelets: 163 10*3/uL (ref 150–400)
RBC: 3.8 MIL/uL — ABNORMAL LOW (ref 3.87–5.11)
RDW: 16.3 % — ABNORMAL HIGH (ref 11.5–15.5)
WBC: 11 10*3/uL — ABNORMAL HIGH (ref 4.0–10.5)
nRBC: 0 % (ref 0.0–0.2)

## 2020-11-17 LAB — RESP PANEL BY RT-PCR (FLU A&B, COVID) ARPGX2
Influenza A by PCR: NEGATIVE
Influenza B by PCR: NEGATIVE
SARS Coronavirus 2 by RT PCR: NEGATIVE

## 2020-11-17 LAB — BASIC METABOLIC PANEL
Anion gap: 9 (ref 5–15)
BUN: 33 mg/dL — ABNORMAL HIGH (ref 8–23)
CO2: 31 mmol/L (ref 22–32)
Calcium: 9.8 mg/dL (ref 8.9–10.3)
Chloride: 96 mmol/L — ABNORMAL LOW (ref 98–111)
Creatinine, Ser: 1.46 mg/dL — ABNORMAL HIGH (ref 0.44–1.00)
GFR, Estimated: 38 mL/min — ABNORMAL LOW (ref >60)
Glucose, Bld: 119 mg/dL — ABNORMAL HIGH (ref 70–99)
Potassium: 4.6 mmol/L (ref 3.5–5.1)
Sodium: 136 mmol/L (ref 135–145)

## 2020-11-17 MED ORDER — HYDROCODONE-ACETAMINOPHEN 5-325 MG PO TABS: 1.0000 | ORAL_TABLET | ORAL | 0 refills | Status: DC | PRN

## 2020-11-17 MED ORDER — POLYETHYLENE GLYCOL 3350 17 G PO PACK
17.0000 g | PACK | Freq: Every day | ORAL | Status: DC
  Administered 2020-11-18: 17 g via ORAL
  Filled 2020-11-17: qty 1

## 2020-11-17 MED ORDER — SENNOSIDES-DOCUSATE SODIUM 8.6-50 MG PO TABS
1.0000 | ORAL_TABLET | Freq: Two times a day (BID) | ORAL | Status: DC
  Administered 2020-11-17 – 2020-11-18 (×2): 1 via ORAL
  Filled 2020-11-17 (×2): qty 1

## 2020-11-17 MED ORDER — ONDANSETRON HCL 4 MG PO TABS: 4.0000 mg | ORAL_TABLET | Freq: Four times a day (QID) | ORAL | 0 refills | Status: DC | PRN

## 2020-11-17 NOTE — Progress Notes (Signed)
Subjective: 1 Day Post-Op Procedure(s) (LRB): REVERSE SHOULDER ARTHROPLASTY (Right) Patient reports pain as 7 on 0-10 scale.   Patient is well, and has had no acute complaints or problems Plan is to go Skilled nursing facility after hospital stay. Negative for chest pain and shortness of breath Fever: no Gastrointestinal:Negative for nausea and vomiting  Objective: Vital signs in last 24 hours: Temp:  [97.5 F (36.4 C)-98.3 F (36.8 C)] 97.9 F (36.6 C) (09/21 0352) Pulse Rate:  [48-82] 55 (09/21 0352) Resp:  [14-21] 18 (09/21 0352) BP: (135-161)/(50-75) 156/66 (09/21 0352) SpO2:  [87 %-95 %] 93 % (09/21 0352) Weight:  [131.5 kg] 131.5 kg (09/20 1147)  Intake/Output from previous day:  Intake/Output Summary (Last 24 hours) at 11/17/2020 0756 Last data filed at 11/17/2020 0351 Gross per 24 hour  Intake 1320 ml  Output 1425 ml  Net -105 ml    Intake/Output this shift: No intake/output data recorded.  Labs: Recent Labs    11/17/20 0328  HGB 11.5*   Recent Labs    11/17/20 0328  WBC 11.0*  RBC 3.80*  HCT 35.2*  PLT 163   Recent Labs    11/17/20 0328  NA 136  K 4.6  CL 96*  CO2 31  BUN 33*  CREATININE 1.46*  GLUCOSE 119*  CALCIUM 9.8   No results for input(s): LABPT, INR in the last 72 hours.   EXAM General - Patient is Alert, Appropriate, and Oriented Extremity - ABD soft Sensation intact distally Incision: dressing C/D/I No cellulitis present Intact to light touch to the right arm this morning. Able to flex and extend wrist without pain. Dressing/Incision - clean, dry, no drainage Motor Function - intact, moving foot and toes well on exam.  Abdomen soft to palpation, intact bowel sounds.  Past Medical History:  Diagnosis Date   Arthritis    RA   Asthma    Basal cell carcinoma    Cataract 2018   bilateral eyes; corrected with surgery   Claustrophobia    Collagen vascular disease ([place])    RA   COPD (chronic obstructive pulmonary  disease) (HCC)    Diastolic dysfunction    a. echo 07/2014: EF 55-60%, no RWMA, GR2DD, mild MR, LA moderately dilated, PASP 38 mm Hg   Dyslipidemia    Headache    migraines   Hemihypertrophy    History of cardiac cath    a. cardiac cath 05/24/2010 - nonobstructive CAD   History of gout    Hyperplastic colonic polyp 2003   Hypertension    Hypokalemia    Morbid obesity ([place])    PAF (paroxysmal atrial fibrillation) ([place])    a. on Pradaxa; b. CHADSVASc at least 2 (HTN & female)   Rheumatoid arthritis(714.0)    Sleep apnea    a. not compliant with CPAP   Assessment/Plan: 1 Day Post-Op Procedure(s) (LRB): REVERSE SHOULDER ARTHROPLASTY (Right) Principal Problem:   Closed dislocation of right shoulder Active Problems:   Long term current use of anticoagulant therapy   Morbid obesity with BMI of 50.0-59.9, adult (HCC)   HTN (hypertension)   CAD (coronary artery disease)   Rheumatoid arthritis (HCC)   Paroxysmal A-fib (HCC)   Sleep apnea   COPD (chronic obstructive pulmonary disease) (HCC)   Decubitus ulcer of sacral region, stage 2 (HCC)  Estimated body mass index is 51.35 kg/m as calculated from the following:   Height as of this encounter: 5\' 3"  (1.6 m).   Weight as of this encounter: 131.5  kg. Advance diet Up with therapy  Labs reviewed, Hg 11.5, WBC 11.0 Sling and polar care intact to the right shoulder. Patient is passing gas this morning without pain. Up with therapy today.  Plan for discharge to SNF this afternoon.  Continue Eliquis for DVT prophylaxis. Follow-up with [place] in 10-14 days for staple removal.  DVT Prophylaxis - Foot Pumps, TED hose, and Eliquis Non-weightbearing to the right arm.  Raquel Lithzy Bernard, PA-C [facility] Orthopaedic Surgery 11/17/2020, 7:56 AM

## 2020-11-17 NOTE — Evaluation (Signed)
Occupational Therapy Re-Evaluation Patient Details Name: Tina Pacheco MRN: [id number] DOB: 1951-02-22 Today's Date: 11/17/2020   History of Present Illness 70 y.o. female with medical history significant for Rheumatoid arthritis on Arava, CAD, paroxysmal A. fib on Eliquis, chronic diastolic heart failure, HTN, COPD, sleep apnea, morbid obesity with BMI of 50 who presents to the ED on referral from [facility], for management of right shoulder dislocation, relocated but ultimately needed Total shoulder replacement 9/20.   Clinical Impression   Tina Pacheco was seen for OT re-evaluation on this date s/p TSA. Upon arrival to room pt reclined in bed, shoulder sling straps doffed. Pt requires SUPERVISION + bed real use exit R side of bed. MAX A don/doff sling seated EOB. MAX A for LBD seated EOB. CGA + HW for ADL t/f and limited in room mobility. SETUP seated grooming tasks using non-dominant LUE. Left with PT in room end of session. Pt making good progress toward goals. Pt continues to benefit from skilled OT services to maximize return to PLOF and minimize risk of future falls, injury, caregiver burden, and readmission. Will continue to follow POC. Discharge recommendation remains appropriate.        Recommendations for follow up therapy are one component of a multi-disciplinary discharge planning process, led by the attending physician.  Recommendations may be updated based on patient status, additional functional criteria and insurance authorization.   Follow Up Recommendations  SNF;Supervision/Assistance - 24 hour    Equipment Recommendations  Other (comment)    Recommendations for Other Services       Precautions / Restrictions Precautions Precautions: Fall;Shoulder Shoulder Interventions: Shoulder sling/immobilizer;Shoulder abduction pillow;Off for dressing/bathing/exercises Required Braces or Orthoses: Sling Restrictions Weight Bearing Restrictions: Yes RUE Weight Bearing: Non  weight bearing      Mobility Bed Mobility Overal bed mobility: Needs Assistance Bed Mobility: Supine to Sit     Supine to sit: Supervision;HOB elevated     General bed mobility comments: heavy rail use    Transfers Overall transfer level: Needs assistance Equipment used: Hemi-walker Transfers: Sit to/from Stand Sit to Stand: Supervision Stand pivot transfers: Supervision       General transfer comment: Pt with some anxiety (specifically reports knees quickly get weak and could buckle) getting to standing, but did manage to do so w/o phyiscal assist.    Balance Overall balance assessment: Needs assistance Sitting-balance support: Single extremity supported;Feet supported Sitting balance-Leahy Scale: Good     Standing balance support: Single extremity supported;During functional activity Standing balance-Leahy Scale: Fair Standing balance comment: No overt LOBs but reliant on hemiwalker and with general hesitancy and guarding                           ADL either performed or assessed with clinical judgement   ADL Overall ADL's : Needs assistance/impaired                                       General ADL Comments: MAX A don/doff sling seated EOB. MAX A for LBD seated EOB. CGA + HW for ADL t/f and limited in room mobility. SETUP seated grooming tasks using non-dominant LUE      Pertinent Vitals/Pain Pain Assessment: Faces Faces Pain Scale: Hurts little more Pain Location: R shoulder Pain Descriptors / Indicators: Aching;Discomfort;Grimacing Pain Intervention(s): Limited activity within patient's tolerance;Repositioned     Hand Dominance Right  Extremity/Trunk Assessment Upper Extremity Assessment Upper Extremity Assessment: RUE deficits/detail RUE Deficits / Details: R UE in immob/sling RUE: Unable to fully assess due to immobilization   Lower Extremity Assessment Lower Extremity Assessment: Generalized weakness        Communication Communication Communication: No difficulties   Cognition Arousal/Alertness: Awake/alert Behavior During Therapy: WFL for tasks assessed/performed;Anxious Overall Cognitive Status: Within Functional Limits for tasks assessed                                     General Comments       Exercises Exercises: Other exercises Other Exercises Other Exercises: Pt educated re: OT role, DME recs, d/c recs, falls prevention, sling mgmt, polar care mgmt Other Exercises: LBD, sup>sit, sit<>stand, sitting/standing balance/tolerance, ~8 ft mobility   Shoulder Instructions      Home Living Family/patient expects to be discharged to:: Skilled nursing facility Living Arrangements: Spouse/significant other Available Help at Discharge: Family Type of Home: House Home Access: Ramped entrance     Home Layout: One level     Bathroom Shower/Tub: Tub/shower unit         Home Equipment: Environmental consultant - 4 wheels;Cane - single point;Bedside commode   Additional Comments: uses rollator at baseline, is able to ambulate ~69ft. does not use AD in bathroom as it will not fit. Pt holds onto sink counter, grab bars and tub for stability.      Prior Functioning/Environment Level of Independence: Needs assistance  Gait / Transfers Assistance Needed: Mod I ADL's / Homemaking Assistance Needed: Assist with showering, full body dressing (pt can partially dress herself), cooking, cleaning.                     OT Goals(Current goals can be found in the care plan section) Acute Rehab OT Goals Patient Stated Goal: to go home OT Goal Formulation: With patient Time For Goal Achievement: 12/01/20 Potential to Achieve Goals: Good ADL Goals Pt Will Perform Grooming: with supervision;standing Pt Will Perform Upper Body Dressing: with min assist;sitting Pt Will Perform Lower Body Dressing: with min assist;sit to/from stand Pt Will Transfer to Toilet: with modified  independence;ambulating;regular height toilet Pt Will Perform Toileting - Clothing Manipulation and hygiene: with supervision;sit to/from stand  OT Frequency: Min 2X/week    AM-PAC OT "6 Clicks" Daily Activity     Outcome Measure Help from another person eating meals?: A Little Help from another person taking care of personal grooming?: A Little Help from another person toileting, which includes using toliet, bedpan, or urinal?: A Lot Help from another person bathing (including washing, rinsing, drying)?: A Lot Help from another person to put on and taking off regular upper body clothing?: A Lot Help from another person to put on and taking off regular lower body clothing?: A Lot 6 Click Score: 14   End of Session Nurse Communication: Mobility status  Activity Tolerance: Patient tolerated treatment well Patient left: in chair;with call bell/phone within reach;with chair alarm set  OT Visit Diagnosis: Unsteadiness on feet (R26.81);Muscle weakness (generalized) (M62.81)                Time: 4650-3546 OT Time Calculation (min): 30 min Charges:  OT General Charges $OT Visit: 1 Visit OT Evaluation $OT Re-eval: 1 Re-eval OT Treatments $Self Care/Home Management : 8-22 mins  Dessie Coma, M.S. OTR/L  11/17/20, 1:01 PM  ascom [phone]

## 2020-11-17 NOTE — Progress Notes (Signed)
PROGRESS NOTE    Tina Pacheco  OBS:[id number] DOB: June 05, 1950 DOA: 11/10/2020 PCP: Venia Carbon, MD    Brief Narrative:   70 y.o. female with medical history significant for Rheumatoid arthritis on Arava, CAD, paroxysmal A. fib on Eliquis, chronic diastolic heart failure, HTN, CKD stage IIIa, COPD, sleep apnea, morbid obesity with BMI of 50 who presents to the ED on referral from [facility], for management of right shoulder dislocation.   She underwent reduction of right shoulder dislocation the operative on 11/11/2020.  However, she continued to have right shoulder pain so she was taken back to the operating room on 11/16/2020 for reverse right total shoulder arthroplasty.   She complained of persistent cough.  Chest x-ray was concerning for suspected left midlung pneumonia.  She was started on Augmentin for this   Assessment & Plan:   Principal Problem:   Closed dislocation of right shoulder Active Problems:   Long term current use of anticoagulant therapy   Morbid obesity with BMI of 50.0-59.9, adult (HCC)   HTN (hypertension)   CAD (coronary artery disease)   Rheumatoid arthritis (HCC)   Paroxysmal A-fib (HCC)   Sleep apnea   COPD (chronic obstructive pulmonary disease) (HCC)   Decubitus ulcer of sacral region, stage 2 (HCC)  Nontraumatic close dislocation of right shoulder fracture of right posterior superior humeral head  severe subacromial/subdeltoid bursitis S/p reduction of right shoulder dislocation in the OR on 11/11/2020.   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   Plan: As needed pain control Continue therapy evaluations Anticipate medical readiness for discharge 9/22   Cough, suspected left midlung pneumonia  Chest x-ray done on 11/13/2020 showed infiltrate in the left midlung.   Lactic acid and procalcitonin levels were normal.   No growth on cultures thus far.   Plan: Continue Augmentin.  Limit course of treatment to 5 days    Paroxysmal atrial fibrillation  Continue Eliquis, metoprolol and amiodarone  Chronic diastolic CHF  Compensated.  Continue diuretics  Rheumatoid arthritis Continue Arava  Other comorbidities include hypertension, CAD, OSA, CKD stage IIIa   Awaiting placement to SNF   DVT prophylaxis: Eliquis Code Status: Full Family Communication: None today Disposition Plan: Status is: Inpatient  Remains inpatient appropriate because:Inpatient level of care appropriate due to severity of illness  Dispo: The patient is from: Home              Anticipated d/c is to: SNF              Patient currently is not medically stable to d/c.   Difficult to place patient No  We will continue to optimize pain regimen.  Anticipate medical readiness for discharge on 9/22     Level of care: Med-Surg  Consultants:  Orthopedics  Procedures:  reduction of right shoulder dislocation in the OR on 11/11/2020.    reverse right total shoulder arthroplasty on 11/16/2020.    Antimicrobials:  Augmentin   Subjective: Seen and examined.  Sitting up in chair.  Endorses some mild unsteadiness on the feet and pain in right shoulder.  Objective: Vitals:   11/16/20 2349 11/17/20 0352 11/17/20 0841 11/17/20 1251  BP: (!) 135/58 (!) 156/66 (!) 163/74 (!) 126/54  Pulse: 60 (!) 55 (!) 56 60  Resp: 18 18 16 18   Temp: 98.1 F (36.7 C) 97.9 F (36.6 C) 98 F (36.7 C) 97.8 F (36.6 C)  TempSrc: Oral Oral Oral   SpO2: 95% 93% 93% 96%  Weight:  Height:        Intake/Output Summary (Last 24 hours) at 11/17/2020 1336 Last data filed at 11/17/2020 0351 Gross per 24 hour  Intake 1220 ml  Output 1175 ml  Net 45 ml   Filed Weights   11/10/20 1700 11/11/20 1153 11/16/20 1147  Weight: 131.5 kg 131.5 kg 131.5 kg    Examination:  General exam: Appears calm and comfortable  Respiratory system: Clear to auscultation. Respiratory effort normal. Cardiovascular system: S1 & S2 heard, RRR. No JVD, murmurs,  rubs, gallops or clicks. No pedal edema. Gastrointestinal system: Abdomen is nondistended, soft and nontender. No organomegaly or masses felt. Normal bowel sounds heard. Central nervous system: Alert and oriented. No focal neurological deficits. Extremities: Right shoulder tenderness.  Right arm in sling Skin: Stage II sacral decubitus ulcer.  Erythematous lower abdomen Psychiatry: Judgement and insight appear normal. Mood & affect appropriate.     Data Reviewed: I have personally reviewed following labs and imaging studies  CBC: Recent Labs  Lab 11/10/20 2012 11/13/20 1258 11/17/20 0328  WBC 7.0 7.3 11.0*  NEUTROABS 4.2 5.1  --   HGB 12.7 12.5 11.5*  HCT 39.4 38.4 35.2*  MCV 94.3 93.2 92.6  PLT 165 186 329   Basic Metabolic Panel: Recent Labs  Lab 11/10/20 2012 11/14/20 0613 11/17/20 0328  NA 138 137 136  K 3.9 3.7 4.6  CL 103 99 96*  CO2 25 31 31   GLUCOSE 88 117* 119*  BUN 28* 37* 33*  CREATININE 1.19* 1.24* 1.46*  CALCIUM 10.2 9.7 9.8   GFR: Estimated Creatinine Clearance: 47.5 mL/min (A) (by C-G formula based on SCr of 1.46 mg/dL (H)). Liver Function Tests: No results for input(s): AST, ALT, ALKPHOS, BILITOT, PROT, ALBUMIN in the last 168 hours. No results for input(s): LIPASE, AMYLASE in the last 168 hours. No results for input(s): AMMONIA in the last 168 hours. Coagulation Profile: No results for input(s): INR, PROTIME in the last 168 hours. Cardiac Enzymes: No results for input(s): CKTOTAL, CKMB, CKMBINDEX, TROPONINI in the last 168 hours. BNP (last 3 results) No results for input(s): PROBNP in the last 8760 hours. HbA1C: No results for input(s): HGBA1C in the last 72 hours. CBG: No results for input(s): GLUCAP in the last 168 hours. Lipid Profile: No results for input(s): CHOL, HDL, LDLCALC, TRIG, CHOLHDL, LDLDIRECT in the last 72 hours. Thyroid Function Tests: No results for input(s): TSH, T4TOTAL, FREET4, T3FREE, THYROIDAB in the last 72  hours. Anemia Panel: No results for input(s): VITAMINB12, FOLATE, FERRITIN, TIBC, IRON, RETICCTPCT in the last 72 hours. Sepsis Labs: Recent Labs  Lab 11/13/20 1258 11/14/20 0613 11/15/20 0626  PROCALCITON <0.10 <0.10 <0.10  LATICACIDVEN 1.9  --   --     Recent Results (from the past 240 hour(s))  Resp Panel by RT-PCR (Flu A&B, Covid) Nasopharyngeal Swab     Status: None   Collection Time: 11/10/20  7:49 PM   Specimen: Nasopharyngeal Swab; Nasopharyngeal(NP) swabs in vial transport medium  Result Value Ref Range Status   SARS Coronavirus 2 by RT PCR NEGATIVE NEGATIVE Final    Comment: (NOTE) SARS-CoV-2 target nucleic acids are NOT DETECTED.  The SARS-CoV-2 RNA is generally detectable in upper respiratory specimens during the acute phase of infection. The lowest concentration of SARS-CoV-2 viral copies this assay can detect is 138 copies/mL. A negative result does not preclude SARS-Cov-2 infection and should not be used as the sole basis for treatment or other patient management decisions. A negative result may occur  with  improper specimen collection/handling, submission of specimen other than nasopharyngeal swab, presence of viral mutation(s) within the areas targeted by this assay, and inadequate number of viral copies(<138 copies/mL). A negative result must be combined with clinical observations, patient history, and epidemiological information. The expected result is Negative.  Fact Sheet for Patients:  EntrepreneurPulse.com.au  Fact Sheet for Healthcare Providers:  IncredibleEmployment.be  This test is no t yet approved or cleared by the [place] FDA and  has been authorized for detection and/or diagnosis of SARS-CoV-2 by FDA under an Emergency Use Authorization (EUA). This EUA will remain  in effect (meaning this test can be used) for the duration of the COVID-19 declaration under Section 564(b)(1) of the Act,  21 U.S.C.section 360bbb-3(b)(1), unless the authorization is terminated  or revoked sooner.       Influenza A by PCR NEGATIVE NEGATIVE Final   Influenza B by PCR NEGATIVE NEGATIVE Final    Comment: (NOTE) The Xpert Xpress SARS-CoV-2/FLU/RSV plus assay is intended as an aid in the diagnosis of influenza from Nasopharyngeal swab specimens and should not be used as a sole basis for treatment. Nasal washings and aspirates are unacceptable for Xpert Xpress SARS-CoV-2/FLU/RSV testing.  Fact Sheet for Patients: EntrepreneurPulse.com.au  Fact Sheet for Healthcare Providers: IncredibleEmployment.be  This test is not yet approved or cleared by the [place] FDA and has been authorized for detection and/or diagnosis of SARS-CoV-2 by FDA under an Emergency Use Authorization (EUA). This EUA will remain in effect (meaning this test can be used) for the duration of the COVID-19 declaration under Section 564(b)(1) of the Act, 21 U.S.C. section 360bbb-3(b)(1), unless the authorization is terminated or revoked.  Performed at [facility], [address] [place], [place] [address]   CULTURE, BLOOD (ROUTINE X 2) w Reflex to ID Panel     Status: None (Preliminary result)   Collection Time: 11/13/20  1:01 PM   Specimen: BLOOD LEFT HAND  Result Value Ref Range Status   Specimen Description BLOOD LEFT HAND  Final   Special Requests   Final    BOTTLES DRAWN AEROBIC AND ANAEROBIC Blood Culture results may not be optimal due to an inadequate volume of blood received in culture bottles   Culture   Final    NO GROWTH 4 DAYS Performed at [facility], [address] [place], [place] [address]    Report Status PENDING  Incomplete  CULTURE, BLOOD (ROUTINE X 2) w Reflex to ID Panel     Status: None (Preliminary result)   Collection Time: 11/13/20  1:03 PM   Specimen: BLOOD  Result Value Ref Range Status   Specimen Description BLOOD BLOOD  LEFT WRIST  Final   Special Requests   Final    BOTTLES DRAWN AEROBIC ONLY Blood Culture results may not be optimal due to an inadequate volume of blood received in culture bottles   Culture   Final    NO GROWTH 4 DAYS Performed at [facility], [address] [place], [place] [address]    Report Status PENDING  Incomplete         Radiology Studies: DG Shoulder Right Port  Result Date: 11/16/2020 CLINICAL DATA:  Status post reverse arthroplasty. EXAM: PORTABLE RIGHT SHOULDER COMPARISON:  Radiograph 11/10/2020 FINDINGS: Reverse right shoulder arthroplasty in expected alignment. No periprosthetic lucency or fracture. Recent postsurgical change includes soft tissue edema and skin staples in place. IMPRESSION: Reverse right shoulder arthroplasty without immediate postoperative complication. Electronically Signed   By:  Keith Rake M.D.   On: 11/16/2020 17:34        Scheduled Meds:  acetaminophen  500 mg Oral Q6H   amiodarone  200 mg Oral Daily   amoxicillin-clavulanate  1 tablet Oral Q12H   apixaban  5 mg Oral BID   atorvastatin  20 mg Oral PC supper   benzonatate  100 mg Oral TID   gabapentin  300 mg Oral QID   leflunomide  20 mg Oral Daily   metoprolol succinate  50 mg Oral Daily   montelukast  10 mg Oral QHS   oxyCODONE  20 mg Oral BID   polyethylene glycol  17 g Oral Daily   potassium chloride SA  20 mEq Oral Q breakfast   senna-docusate  1 tablet Oral BID   spironolactone  25 mg Oral Daily   terazosin  10 mg Oral QHS   torsemide  40 mg Oral Daily   Continuous Infusions:   LOS: 6 days    Time spent: 25 minutes    Sidney Ace, MD [facility] Pager [phone]  If 7PM-7AM, please contact night-coverage 11/17/2020, 1:36 PM

## 2020-11-17 NOTE — Progress Notes (Signed)
Attempted to get volunteers for notary witnesses for Advanced Directive document, none available at the moment. Will try again before shift ends.

## 2020-11-17 NOTE — Progress Notes (Signed)
  Chaplain On-Call visited with the patient as follow up to conversations of yesterday about the patient's desire for completion of Advance Directives documents.  Chaplain celebrated the patient's news of successful surgery, and possible discharge later today.  Chaplain will ask the Afternoon Chaplain to see if Volunteers can be available to serve as Witnesses for the documents, as well as the availability of a hospital employee Notary.  Chaplain Pollyann Samples M.Div., [facility]

## 2020-11-17 NOTE — Evaluation (Signed)
Physical Therapy ReEvaluation Patient Details Name: Tina Pacheco MRN: [id number] DOB: 01/07/1951 Today's Date: 11/17/2020  History of Present Illness  70 y.o. female with medical history significant for Rheumatoid arthritis on Arava, CAD, paroxysmal A. fib on Eliquis, chronic diastolic heart failure, HTN, COPD, sleep apnea, morbid obesity with BMI of 50 who presents to the ED on referral from [facility], for management of right shoulder dislocation, relocated but ultimately needed Total shoulder replacement 9/20.  Clinical Impression  Pt did relatively well with limited mobility but showed considerable anxiousness about knee buckling/getting weak.  She was reliant on the hemiwalker/L UE and though she did not have any LOBs or buckling showed poor activity tolerance and agrees that she is not in a position to safely go home with husband assist.  Agree that rehab is the only safe d/c option at this point.       Recommendations for follow up therapy are one component of a multi-disciplinary discharge planning process, led by the attending physician.  Recommendations may be updated based on patient status, additional functional criteria and insurance authorization.  Follow Up Recommendations SNF    Equipment Recommendations  None recommended by PT    Recommendations for Other Services       Precautions / Restrictions Precautions Precautions: Fall Restrictions RUE Weight Bearing: Non weight bearing      Mobility  Bed Mobility               General bed mobility comments: not tested, per OT pt was reliant on rails and showed some struggle but did not need heavy assist to attain sitting EOB    Transfers Overall transfer level: Needs assistance Equipment used: Hemi-walker Transfers: Sit to/from Stand Sit to Stand: Supervision Stand pivot transfers: Supervision       General transfer comment: Pt with some anxiety (specifically reports knees quickly get weak and could  buckle) getting to standing, but did manage to do so w/o phyiscal assist.  Ambulation/Gait Ambulation/Gait assistance: Min guard Gait Distance (Feet): 10 Feet Assistive device: Hemi-walker       General Gait Details: Pt with slow, guarded gait with no overt buckling though she increasingly reported more and more fatigue and relativley quick need to sit.  Encouraged to take a few more steps and get to at least 10 ft which she did manage, but w/o confidence and needing a lot of encouragement.  Stairs            Wheelchair Mobility    Modified Rankin (Stroke Patients Only)       Balance Overall balance assessment: Needs assistance Sitting-balance support: Single extremity supported;Feet supported Sitting balance-Leahy Scale: Good     Standing balance support: Single extremity supported;During functional activity Standing balance-Leahy Scale: Fair Standing balance comment: No overt LOBs but reliant on hemiwalker and with general hesitancy and guarding                             Pertinent Vitals/Pain Pain Assessment:  (chronic back pain, L hip/knee pain, unrated with general low grade c/o pain with most acts)    Home Living Family/patient expects to be discharged to:: Skilled nursing facility Living Arrangements: Spouse/significant other Available Help at Discharge: Family Type of Home: House Home Access: Ramped entrance     Home Layout: One level Home Equipment: Environmental consultant - 4 wheels;Cane - single point;Bedside commode Additional Comments: uses rollator at baseline, is able to ambulate ~89ft. does not use  AD in bathroom as it will not fit. Pt holds onto sink counter, grab bars and tub for stability.    Prior Function Level of Independence: Needs assistance   Gait / Transfers Assistance Needed: Mod I  ADL's / Homemaking Assistance Needed: Assist with showering, full body dressing (pt can partially dress herself), cooking, cleaning.        Hand  Dominance   Dominant Hand: Right    Extremity/Trunk Assessment   Upper Extremity Assessment Upper Extremity Assessment: Defer to OT evaluation RUE Deficits / Details: R UE in immob/sling    Lower Extremity Assessment Lower Extremity Assessment: Generalized weakness;Overall WFL for tasks assessed (R LE grossly 4/5, L grossly 3+/5)       Communication   Communication: No difficulties  Cognition Arousal/Alertness: Awake/alert Behavior During Therapy: WFL for tasks assessed/performed Overall Cognitive Status: Within Functional Limits for tasks assessed                                        General Comments      Exercises     Assessment/Plan    PT Assessment Patient needs continued PT services  PT Problem List Decreased strength;Decreased range of motion;Obesity;Decreased activity tolerance;Decreased safety awareness;Decreased balance;Pain;Decreased mobility       PT Treatment Interventions Balance training;Gait training;DME instruction;Neuromuscular re-education;Functional mobility training;Therapeutic activities;Therapeutic exercise;Patient/family education;Wheelchair mobility training    PT Goals (Current goals can be found in the Care Plan section)  Acute Rehab PT Goals Patient Stated Goal: to go home PT Goal Formulation: With patient Time For Goal Achievement: 12/01/20 Potential to Achieve Goals: Fair    Frequency Min 2X/week   Barriers to discharge Decreased caregiver support      Co-evaluation               AM-PAC PT "6 Clicks" Mobility  Outcome Measure Help needed turning from your back to your side while in a flat bed without using bedrails?: A Little Help needed moving from lying on your back to sitting on the side of a flat bed without using bedrails?: A Little Help needed moving to and from a bed to a chair (including a wheelchair)?: A Little Help needed standing up from a chair using your arms (e.g., wheelchair or bedside  chair)?: A Little Help needed to walk in hospital room?: A Lot Help needed climbing 3-5 steps with a railing? : Total 6 Click Score: 15    End of Session   Activity Tolerance: Patient tolerated treatment well Patient left: with call bell/phone within reach;with chair alarm set Nurse Communication: Mobility status PT Visit Diagnosis: Unsteadiness on feet (R26.81);Other abnormalities of gait and mobility (R26.89);History of falling (Z91.81);Muscle weakness (generalized) (M62.81);Pain Pain - Right/Left: Right Pain - part of body: Shoulder    Time: 1941-7408 PT Time Calculation (min) (ACUTE ONLY): 17 min   Charges:   PT Evaluation $PT Re-evaluation: 1 Re-eval          Kreg Shropshire, DPT 11/17/2020, 10:45 AM

## 2020-11-17 NOTE — Discharge Instructions (Signed)
Diet: As you were doing prior to hospitalization   Shower:  May shower but keep the wounds dry, use an occlusive plastic wrap, NO SOAKING IN TUB.  If the bandage gets wet, change with a clean dry gauze.  Dressing:  Continue sling to the right arm.  Activity:  Increase activity slowly as tolerated, but follow the weight bearing instructions below.  No lifting or driving for 6 weeks.  Weight Bearing:   Non-weightbearing to the right arm.  To prevent constipation: you may use a stool softener such as -  Colace (over the counter) 100 mg by mouth twice a day  Drink plenty of fluids (prune juice may be helpful) and high fiber foods Miralax (over the counter) for constipation as needed.    Itching:  If you experience itching with your medications, try taking only a single pain pill, or even half a pain pill at a time.  You may take up to 10 pain pills per day, and you can also use benadryl over the counter for itching or also to help with sleep.   Precautions:  If you experience chest pain or shortness of breath - call 911 immediately for transfer to the hospital emergency department!!  If you develop a fever greater that 101 F, purulent drainage from wound, increased redness or drainage from wound, or calf pain-Call [place]                                              Follow- Up Appointment:  Please call for an appointment to be seen in 2 weeks at [facility]

## 2020-11-18 DIAGNOSIS — R1312 Dysphagia, oropharyngeal phase: Secondary | ICD-10-CM | POA: Diagnosis not present

## 2020-11-18 DIAGNOSIS — I25119 Atherosclerotic heart disease of native coronary artery with unspecified angina pectoris: Secondary | ICD-10-CM | POA: Diagnosis not present

## 2020-11-18 DIAGNOSIS — Z4789 Encounter for other orthopedic aftercare: Secondary | ICD-10-CM | POA: Diagnosis not present

## 2020-11-18 DIAGNOSIS — M069 Rheumatoid arthritis, unspecified: Secondary | ICD-10-CM | POA: Diagnosis not present

## 2020-11-18 DIAGNOSIS — X500XXA Overexertion from strenuous movement or load, initial encounter: Secondary | ICD-10-CM | POA: Diagnosis not present

## 2020-11-18 DIAGNOSIS — J449 Chronic obstructive pulmonary disease, unspecified: Secondary | ICD-10-CM | POA: Diagnosis not present

## 2020-11-18 DIAGNOSIS — E782 Mixed hyperlipidemia: Secondary | ICD-10-CM | POA: Diagnosis not present

## 2020-11-18 DIAGNOSIS — J189 Pneumonia, unspecified organism: Secondary | ICD-10-CM | POA: Diagnosis not present

## 2020-11-18 DIAGNOSIS — R2681 Unsteadiness on feet: Secondary | ICD-10-CM | POA: Diagnosis not present

## 2020-11-18 DIAGNOSIS — G894 Chronic pain syndrome: Secondary | ICD-10-CM | POA: Diagnosis not present

## 2020-11-18 DIAGNOSIS — Z6841 Body Mass Index (BMI) 40.0 and over, adult: Secondary | ICD-10-CM | POA: Diagnosis not present

## 2020-11-18 DIAGNOSIS — Z23 Encounter for immunization: Secondary | ICD-10-CM | POA: Diagnosis not present

## 2020-11-18 DIAGNOSIS — N1832 Chronic kidney disease, stage 3b: Secondary | ICD-10-CM | POA: Diagnosis not present

## 2020-11-18 DIAGNOSIS — I10 Essential (primary) hypertension: Secondary | ICD-10-CM | POA: Diagnosis not present

## 2020-11-18 DIAGNOSIS — G2581 Restless legs syndrome: Secondary | ICD-10-CM | POA: Diagnosis not present

## 2020-11-18 DIAGNOSIS — M6281 Muscle weakness (generalized): Secondary | ICD-10-CM | POA: Diagnosis not present

## 2020-11-18 DIAGNOSIS — I5032 Chronic diastolic (congestive) heart failure: Secondary | ICD-10-CM | POA: Diagnosis not present

## 2020-11-18 DIAGNOSIS — S43004D Unspecified dislocation of right shoulder joint, subsequent encounter: Secondary | ICD-10-CM | POA: Diagnosis not present

## 2020-11-18 DIAGNOSIS — B372 Candidiasis of skin and nail: Secondary | ICD-10-CM | POA: Diagnosis not present

## 2020-11-18 DIAGNOSIS — Z7901 Long term (current) use of anticoagulants: Secondary | ICD-10-CM | POA: Diagnosis not present

## 2020-11-18 DIAGNOSIS — I480 Paroxysmal atrial fibrillation: Secondary | ICD-10-CM | POA: Diagnosis not present

## 2020-11-18 DIAGNOSIS — S43004S Unspecified dislocation of right shoulder joint, sequela: Secondary | ICD-10-CM | POA: Diagnosis not present

## 2020-11-18 LAB — CULTURE, BLOOD (ROUTINE X 2)
Culture: NO GROWTH
Culture: NO GROWTH

## 2020-11-18 LAB — CBC
HCT: 30.8 % — ABNORMAL LOW (ref 36.0–46.0)
Hemoglobin: 10.1 g/dL — ABNORMAL LOW (ref 12.0–15.0)
MCH: 30.7 pg (ref 26.0–34.0)
MCHC: 32.8 g/dL (ref 30.0–36.0)
MCV: 93.6 fL (ref 80.0–100.0)
Platelets: 144 10*3/uL — ABNORMAL LOW (ref 150–400)
RBC: 3.29 MIL/uL — ABNORMAL LOW (ref 3.87–5.11)
RDW: 16.4 % — ABNORMAL HIGH (ref 11.5–15.5)
WBC: 8.4 10*3/uL (ref 4.0–10.5)
nRBC: 0 % (ref 0.0–0.2)

## 2020-11-18 LAB — BASIC METABOLIC PANEL
Anion gap: 8 (ref 5–15)
BUN: 35 mg/dL — ABNORMAL HIGH (ref 8–23)
CO2: 29 mmol/L (ref 22–32)
Calcium: 9.9 mg/dL (ref 8.9–10.3)
Chloride: 96 mmol/L — ABNORMAL LOW (ref 98–111)
Creatinine, Ser: 1.24 mg/dL — ABNORMAL HIGH (ref 0.44–1.00)
GFR, Estimated: 47 mL/min — ABNORMAL LOW (ref >60)
Glucose, Bld: 123 mg/dL — ABNORMAL HIGH (ref 70–99)
Potassium: 3.7 mmol/L (ref 3.5–5.1)
Sodium: 133 mmol/L — ABNORMAL LOW (ref 135–145)

## 2020-11-18 LAB — GLUCOSE, CAPILLARY: Glucose-Capillary: 135 mg/dL — ABNORMAL HIGH (ref 70–99)

## 2020-11-18 LAB — SURGICAL PATHOLOGY

## 2020-11-18 MED ORDER — DM-GUAIFENESIN ER 30-600 MG PO TB12
1.0000 | ORAL_TABLET | Freq: Two times a day (BID) | ORAL | Status: DC
  Administered 2020-11-18: 1 via ORAL
  Filled 2020-11-18 (×3): qty 1

## 2020-11-18 MED ORDER — POTASSIUM CHLORIDE ER 20 MEQ PO TBCR: 20.0000 meq | EXTENDED_RELEASE_TABLET | Freq: Every day | ORAL | 3 refills | Status: DC

## 2020-11-18 NOTE — Progress Notes (Addendum)
   11/18/20 0920  Clinical Encounter Type  Visited With Patient and family together  Visit Type Follow-up  Referral From Nurse  Consult/Referral To Chaplain  Spiritual Encounters  Spiritual Needs Other (Comment)  Chaplain Oni Dietzman completed one AD for Mrs. Tina Pacheco in room 1A-159A. Chaplain also provided emotional and spiritual support.   Pt's Health Care Agent : Tina Pacheco. Olive Bass Ph# [phone]

## 2020-11-18 NOTE — TOC Progression Note (Signed)
Transition of Care [facility]) - Progression Note    Patient Details  Name: Tina Pacheco MRN: [id number] Date of Birth: 1951-01-06  Transition of Care [facility]) CM/SW [place], RN Phone Number: 11/18/2020, 1:22 PM  Clinical Narrative:    The patient is going to room 124 at [facility], Her Family will transport the bed side nurse to call report to [phone], she wants an enema or suppository prior to discharge, the nurse is aware    Expected Discharge Plan: [place] Barriers to Discharge: Continued Medical Work up  Expected Discharge Plan and Services Expected Discharge Plan: [place]   Discharge Planning Services: CM Consult Post Acute Care Choice: [place] arrangements for the past 2 months: Single Family Home Expected Discharge Date: 11/18/20               DME Arranged: N/A DME Agency: NA                   Social Determinants of Health (SDOH) Interventions    Readmission Risk Interventions Readmission Risk Prevention Plan 11/14/2020 06/01/2020  Transportation Screening Complete Complete  PCP or Specialist Appt within 3-5 Days - Complete  HRI or [place] - Complete  Social Work Consult for [place] Planning/Counseling - Complete  Palliative Care Screening - Not Applicable  Medication Review Press photographer) Complete (No Data)  PCP or Specialist appointment within 3-5 days of discharge Complete -  HRI or [place] (No Data) -  SW Recovery Care/Counseling Consult Complete -  Palliative Care Screening Not Applicable -  Skilled Nursing Facility Complete -  Some recent data might be hidden

## 2020-11-18 NOTE — Care Management Important Message (Signed)
Important Message  Patient Details  Name: Tina Pacheco MRN: [id number] Date of Birth: 02/15/1951   Medicare Important Message Given:  Yes     [name] [name] 11/18/2020, 11:10 AM

## 2020-11-18 NOTE — Progress Notes (Signed)
Subjective: 2 Days Post-Op Procedure(s) (LRB): REVERSE SHOULDER ARTHROPLASTY (Right) Patient reports pain as mild in the right shoulder this morning. Patient is well, and has had no acute complaints or problems Plan is to go Skilled nursing facility after hospital stay. Negative for chest pain and shortness of breath Fever: no Gastrointestinal:Negative for nausea and vomiting She has not had a BM yet.  Objective: Vital signs in last 24 hours: Temp:  [97.8 F (36.6 C)-98.2 F (36.8 C)] 98.2 F (36.8 C) (09/22 0805) Pulse Rate:  [55-60] 59 (09/22 0805) Resp:  [15-20] 15 (09/22 0805) BP: (126-158)/(54-71) 150/60 (09/22 0805) SpO2:  [92 %-97 %] 96 % (09/22 0805)  Intake/Output from previous day:  Intake/Output Summary (Last 24 hours) at 11/18/2020 1040 Last data filed at 11/18/2020 0830 Gross per 24 hour  Intake --  Output 1050 ml  Net -1050 ml    Intake/Output this shift: Total I/O In: -  Out: 350 [Urine:350]  Labs: Recent Labs    11/17/20 0328 11/18/20 0458  HGB 11.5* 10.1*   Recent Labs    11/17/20 0328 11/18/20 0458  WBC 11.0* 8.4  RBC 3.80* 3.29*  HCT 35.2* 30.8*  PLT 163 144*   Recent Labs    11/17/20 0328 11/18/20 0458  NA 136 133*  K 4.6 3.7  CL 96* 96*  CO2 31 29  BUN 33* 35*  CREATININE 1.46* 1.24*  GLUCOSE 119* 123*  CALCIUM 9.8 9.9   No results for input(s): LABPT, INR in the last 72 hours.   EXAM General - Patient is Alert, Appropriate, and Oriented Extremity - ABD soft Sensation intact distally No cellulitis present Intact to light touch to the right arm this morning. Able to flex and extend wrist without pain. Dressing/Incision - At most minimal drainage noted to the right shoulder honeycomb dressing. Motor Function - intact, moving foot and toes well on exam.  Abdomen soft to palpation, intact bowel sounds.  Past Medical History:  Diagnosis Date   Arthritis    RA   Asthma    Basal cell carcinoma    Cataract 2018    bilateral eyes; corrected with surgery   Claustrophobia    Collagen vascular disease ([place])    RA   COPD (chronic obstructive pulmonary disease) (HCC)    Diastolic dysfunction    a. echo 07/2014: EF 55-60%, no RWMA, GR2DD, mild MR, LA moderately dilated, PASP 38 mm Hg   Dyslipidemia    Headache    migraines   Hemihypertrophy    History of cardiac cath    a. cardiac cath 05/24/2010 - nonobstructive CAD   History of gout    Hyperplastic colonic polyp 2003   Hypertension    Hypokalemia    Morbid obesity ([place])    PAF (paroxysmal atrial fibrillation) ([place])    a. on Pradaxa; b. CHADSVASc at least 2 (HTN & female)   Rheumatoid arthritis(714.0)    Sleep apnea    a. not compliant with CPAP   Assessment/Plan: 2 Days Post-Op Procedure(s) (LRB): REVERSE SHOULDER ARTHROPLASTY (Right) Principal Problem:   Closed dislocation of right shoulder Active Problems:   Long term current use of anticoagulant therapy   Morbid obesity with BMI of 50.0-59.9, adult (HCC)   HTN (hypertension)   CAD (coronary artery disease)   Rheumatoid arthritis (HCC)   Paroxysmal A-fib (HCC)   Sleep apnea   COPD (chronic obstructive pulmonary disease) (HCC)   Decubitus ulcer of sacral region, stage 2 (HCC)  Estimated body mass index is  51.35 kg/m as calculated from the following:   Height as of this encounter: 5\' 3"  (1.6 m).   Weight as of this encounter: 131.5 kg. Advance diet Up with therapy  Labs reviewed, Hg 10.1, WBC down to 8.4 Sling and polar care intact to the right shoulder.  Honeycomb is intact to the right shoulder. Patient is passing gas this morning without pain.  Patient has received milk of magnesia.  Move to enema today for bowel movement. Patient reports a history of could and takes medication at home for congestion routinely.  Added Muccinex to medication regimen. Up with therapy today.  Plan for discharge to SNF this afternoon.  Continue Eliquis for DVT prophylaxis. Follow-up with [place] in 10-14 days for staple removal.  DVT Prophylaxis - Foot Pumps, TED hose, and Eliquis Non-weightbearing to the right arm.  Raquel Jamiesha Victoria, PA-C [name] 11/18/2020, 10:40 AM

## 2020-11-18 NOTE — Discharge Summary (Signed)
Physician Discharge Summary  Tina Pacheco IRS:[id number] DOB: 04-04-1950 DOA: 11/10/2020  PCP: Venia Carbon, MD  Admit date: 11/10/2020 Discharge date: 11/18/2020  Admitted From: Home Disposition: SNF  Recommendations for Outpatient Follow-up:  Follow up with PCP in 1-2 weeks Follow-up with orthopedics in 2 weeks  Home Health: No Equipment/Devices: Right arm sling  Discharge Condition: Stable CODE STATUS: Full Diet recommendation: Heart Healthy / Carb Modified  Brief/Interim Summary: 70 y.o. female with medical history significant for Rheumatoid arthritis on Arava, CAD, paroxysmal A. fib on Eliquis, chronic diastolic heart failure, HTN, CKD stage IIIa, COPD, sleep apnea, morbid obesity with BMI of 50 who presents to the ED on referral from [facility], for management of right shoulder dislocation.   She underwent reduction of right shoulder dislocation the operative on 11/11/2020.  However, she continued to have right shoulder pain so she was taken back to the operating room on 11/16/2020 for reverse right total shoulder arthroplasty.   She complained of persistent cough.  Chest x-ray was concerning for suspected left midlung pneumonia.  She was started on Augmentin for this  Pleated course of Augmentin in house.  Pain control improved.  Stable for discharge to skilled nursing facility.  Follow-up PCP.  Follow-up orthopedics in 2 weeks   Discharge Diagnoses:  Principal Problem:   Closed dislocation of right shoulder Active Problems:   Long term current use of anticoagulant therapy   Morbid obesity with BMI of 50.0-59.9, adult (HCC)   HTN (hypertension)   CAD (coronary artery disease)   Rheumatoid arthritis (HCC)   Paroxysmal A-fib (HCC)   Sleep apnea   COPD (chronic obstructive pulmonary disease) (HCC)   Decubitus ulcer of sacral region, stage 2 (HCC)  Nontraumatic close dislocation of right shoulder fracture of right posterior superior humeral head  severe  subacromial/subdeltoid bursitis S/p reduction of right shoulder dislocation in the OR on 11/11/2020.   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   Plan: Discharge to skilled nursing facility.  Continue aggressive therapy.  Pain control provided at time of discharge   Cough, suspected left midlung pneumonia  Chest x-ray done on 11/13/2020 showed infiltrate in the left midlung.   Lactic acid and procalcitonin levels were normal.   No growth on cultures thus far.   Plan: Completed 5-day course of Augmentin in house   Paroxysmal atrial fibrillation  Continue Eliquis, metoprolol and amiodarone  Chronic diastolic CHF  Compensated.  Continue diuretics  Rheumatoid arthritis Continue Arava  Other comorbidities include hypertension, CAD, OSA, CKD stage IIIa  Discharge Instructions  Discharge Instructions     Diet - low sodium heart healthy   Complete by: As directed    Increase activity slowly   Complete by: As directed    No wound care   Complete by: As directed       Allergies as of 11/18/2020       Reactions   Fluoxetine Other (See Comments)   Headache, shaking, sleep issues Headache, shaking, sleep issues   Codeine    Nausea and vomiting/only when taking too much        Medication List     STOP taking these medications    Eucrisa 2 % Oint Generic drug: Crisaborole   fluconazole 200 MG tablet Commonly known as: DIFLUCAN   predniSONE 10 MG tablet Commonly known as: DELTASONE   predniSONE 5 MG tablet Commonly known as: DELTASONE       TAKE these medications    amiodarone 200 MG tablet  Commonly known as: PACERONE Take 1 tablet (200 mg total) by mouth daily. Take one tab daily.   apixaban 5 MG Tabs tablet Commonly known as: Eliquis TAKE 1 TABLET(5 MG) BY MOUTH TWICE DAILY   atorvastatin 20 MG tablet Commonly known as: LIPITOR TAKE 1 TABLET BY MOUTH DAILY AT 6 PM   CIMZIA [place] Inject into the skin every 30 (thirty) days.    gabapentin 300 MG capsule Commonly known as: NEURONTIN Take 1 capsule (300 mg total) by mouth 4 (four) times daily.   HYDROcodone-acetaminophen 5-325 MG tablet Commonly known as: NORCO/VICODIN Take 1-2 tablets by mouth every 4 (four) hours as needed for moderate pain.   ketoconazole 2 % cream Commonly known as: NIZORAL APPLY A SMALL AMOUNT TO AFFECTED AREA ONCE A DAY TO RASH ON BUTTOCKS, GROIN   leflunomide 20 MG tablet Commonly known as: ARAVA Take 20 mg by mouth daily.   meclizine 25 MG tablet Commonly known as: ANTIVERT TAKE 1 TABLET(25 MG) BY MOUTH THREE TIMES DAILY AS NEEDED FOR DIZZINESS   metolazone 2.5 MG tablet Commonly known as: ZAROXOLYN Take 2.5 mg by mouth daily as needed.   metoprolol succinate 50 MG 24 hr tablet Commonly known as: TOPROL-XL TAKE 1 TABLET BY MOUTH EVERY DAY WITH OR IMMEDIATELY FOLLOWING A MEAL   montelukast 10 MG tablet Commonly known as: SINGULAIR TAKE 1 TABLET BY MOUTH AT BEDTIME   mupirocin ointment 2 % Commonly known as: BACTROBAN Apply 1 application topically 2 (two) times daily. Qd to burn wounds   nitroGLYCERIN 0.4 MG SL tablet Commonly known as: NITROSTAT Place 1 tablet (0.4 mg total) under the tongue every 5 (five) minutes as needed for chest pain.   nystatin powder Commonly known as: MYCOSTATIN/NYSTOP Apply 1 application topically 3 (three) times daily as needed.   ondansetron 4 MG tablet Commonly known as: ZOFRAN Take 1 tablet (4 mg total) by mouth every 6 (six) hours as needed for nausea.   Oxycodone HCl 20 MG Tabs Take 1 tablet (20 mg total) by mouth in the morning and at bedtime.   polyethylene glycol 17 g packet Commonly known as: MIRALAX / GLYCOLAX Take 17 g by mouth daily as needed for mild constipation.   Potassium Chloride ER 20 MEQ Tbcr Take 20 mEq by mouth daily at 12 noon. What changed: when to take this   SF 5000 Plus 1.1 % Crea dental cream Generic drug: sodium fluoride Place 1 application onto teeth  2 (two) times daily.   spironolactone 25 MG tablet Commonly known as: ALDACTONE TAKE 1 TABLET(25 MG) BY MOUTH DAILY   terazosin 10 MG capsule Commonly known as: HYTRIN TAKE 1 CAPSULE(10 MG) BY MOUTH AT BEDTIME   torsemide 20 MG tablet Commonly known as: DEMADEX Take 40 mg by mouth daily. Takes an extra tablet when feeling SOB or has noticeable edema        Contact information for follow-up providers     Lattie Corns, PA-C Follow up in 14 day(s).   Specialty: Physician Assistant Why: Electa Sniff information: [place] [address] [phone]              Contact information for after-discharge care     Destination     [facility] Preferred SNF .   Service: Skilled Nursing Contact information: [address] [place] [phone]  Allergies  Allergen Reactions   Fluoxetine Other (See Comments)    Headache, shaking, sleep issues Headache, shaking, sleep issues   Codeine     Nausea and vomiting/only when taking too much    Consultations: Orthopedics   Procedures/Studies: DG Shoulder Right  Result Date: 11/10/2020 CLINICAL DATA:  dislocation EXAM: RIGHT SHOULDER - 2+ VIEW COMPARISON:  Shoulder x-ray dated September 23, 2018; chest x-ray dated May 28, 2020 FINDINGS: Right anterior shoulder dislocation. No definite fracture. Mild degenerative changes of the acromioclavicular joint. Mild interstitial opacities of the visualized lung, similar to prior chest x-ray. IMPRESSION: Right anterior shoulder dislocation. No definite fracture, although evaluation is limited due to overlying soft tissues. Correlate with shoulder CT. Electronically Signed   By: Yetta Glassman M.D.   On: 11/10/2020 19:55   CT Shoulder Right Wo Contrast  Result Date: 11/10/2020 CLINICAL DATA:  Right shoulder dislocation. EXAM: CT OF THE UPPER RIGHT  EXTREMITY WITHOUT CONTRAST TECHNIQUE: Multidetector CT imaging of the upper right extremity was performed according to the standard protocol. COMPARISON:  Right shoulder x-rays from same day. FINDINGS: Bones/Joint/Cartilage Anterior dislocation of the humeral head with respect to the glenoid. Impaction fracture of the posterosuperior humeral head (series 6, image 46). The glenoid appears intact. Moderate acromioclavicular osteoarthritis. No joint effusion. Large amount of fluid in the subacromial/subdeltoid bursa. Ligaments Ligaments are suboptimally evaluated by CT. Muscles and Tendons Grossly intact.  No muscle atrophy. Soft tissue No fluid collection or hematoma.  No soft tissue mass. IMPRESSION: 1. Anterior shoulder dislocation with impaction fracture of the posterosuperior humeral head. 2. Severe subacromial/subdeltoid bursitis. Electronically Signed   By: Titus Dubin M.D.   On: 11/10/2020 20:18   DG Chest Port 1 View  Result Date: 11/13/2020 CLINICAL DATA:  Cough. EXAM: PORTABLE CHEST 1 VIEW COMPARISON:  May 28, 2020 FINDINGS: Mild opacity in the left mid lung suspected. The heart, hila, mediastinum, and pleura are normal. No other acute abnormalities. IMPRESSION: Suspected mild opacity in left mid lung may represent subtle pneumonia. Recommend short-term follow-up imaging after treatment. Electronically Signed   By: Dorise Bullion III M.D.   On: 11/13/2020 11:31   DG Shoulder Right Port  Result Date: 11/16/2020 CLINICAL DATA:  Status post reverse arthroplasty. EXAM: PORTABLE RIGHT SHOULDER COMPARISON:  Radiograph 11/10/2020 FINDINGS: Reverse right shoulder arthroplasty in expected alignment. No periprosthetic lucency or fracture. Recent postsurgical change includes soft tissue edema and skin staples in place. IMPRESSION: Reverse right shoulder arthroplasty without immediate postoperative complication. Electronically Signed   By: Keith Rake M.D.   On: 11/16/2020 17:34   DG Humerus  Right  Result Date: 11/11/2020 CLINICAL DATA:  surgery EXAM: DG C-ARM 1-60 MIN; RIGHT HUMERUS - 2+ VIEW FLUOROSCOPY TIME:  Fluoroscopy Time:  33 seconds. Radiation Exposure Index (if provided by the fluoroscopic device): 5.65 mGy. Number of Acquired Spot Images: 2 COMPARISON:  11/10/2020. FINDINGS: Two C-arm fluoroscopic images were obtained intraoperatively and submitted for post operative interpretation. Alignment appears improved when compared to 11/10/2020 radiographs; however, limited assessment of alignment due to single provided projection. Please see the performing provider's procedural report for further detail. IMPRESSION: 1. Intraoperative fluoroscopy during close reduction of right shoulder dislocation. Alignment appears improved when compared to 11/10/2020 radiographs; however, limited assessment of alignment due to single provided projection. Dedicated shoulder radiographs could confirm reduction if clinically indicated. 2. Please see recent CT of the shoulder for characterization of impaction fracture. Electronically Signed   By: Margaretha Sheffield M.D.   On: 11/11/2020 14:46  DG C-Arm 1-60 Min  Result Date: 11/11/2020 CLINICAL DATA:  surgery EXAM: DG C-ARM 1-60 MIN; RIGHT HUMERUS - 2+ VIEW FLUOROSCOPY TIME:  Fluoroscopy Time:  33 seconds. Radiation Exposure Index (if provided by the fluoroscopic device): 5.65 mGy. Number of Acquired Spot Images: 2 COMPARISON:  11/10/2020. FINDINGS: Two C-arm fluoroscopic images were obtained intraoperatively and submitted for post operative interpretation. Alignment appears improved when compared to 11/10/2020 radiographs; however, limited assessment of alignment due to single provided projection. Please see the performing provider's procedural report for further detail. IMPRESSION: 1. Intraoperative fluoroscopy during close reduction of right shoulder dislocation. Alignment appears improved when compared to 11/10/2020 radiographs; however, limited assessment  of alignment due to single provided projection. Dedicated shoulder radiographs could confirm reduction if clinically indicated. 2. Please see recent CT of the shoulder for characterization of impaction fracture. Electronically Signed   By: Margaretha Sheffield M.D.   On: 11/11/2020 14:46   (Echo, Carotid, EGD, Colonoscopy, ERCP)    Subjective: Seen and examined the day of discharge.  Stable no distress.  Right arm in sling.  Pain control improved.  Stable for discharge to skilled nursing facility.  Discharge Exam: Vitals:   11/18/20 0427 11/18/20 0805  BP: (!) 145/60 (!) 150/60  Pulse: (!) 55 (!) 59  Resp: 20 15  Temp: 98.1 F (36.7 C) 98.2 F (36.8 C)  SpO2: 92% 96%   Vitals:   11/17/20 2058 11/17/20 2342 11/18/20 0427 11/18/20 0805  BP: (!) 150/59 (!) 158/69 (!) 145/60 (!) 150/60  Pulse: (!) 57 (!) 55 (!) 55 (!) 59  Resp: 20 20 20 15   Temp: 98.1 F (36.7 C) 97.9 F (36.6 C) 98.1 F (36.7 C) 98.2 F (36.8 C)  TempSrc: Oral Oral Oral   SpO2: 95% 95% 92% 96%  Weight:      Height:        General: Pt is alert, awake, not in acute distress Cardiovascular: RRR, S1/S2 +, no rubs, no gallops Respiratory: CTA bilaterally, no wheezing, no rhonchi Abdominal: Soft, NT, ND, bowel sounds + Extremities: Right upper extremity in sling.  Limited range of motion    The results of significant diagnostics from this hospitalization (including imaging, microbiology, ancillary and laboratory) are listed below for reference.     Microbiology: Recent Results (from the past 240 hour(s))  Resp Panel by RT-PCR (Flu A&B, Covid) Nasopharyngeal Swab     Status: None   Collection Time: 11/10/20  7:49 PM   Specimen: Nasopharyngeal Swab; Nasopharyngeal(NP) swabs in vial transport medium  Result Value Ref Range Status   SARS Coronavirus 2 by RT PCR NEGATIVE NEGATIVE Final    Comment: (NOTE) SARS-CoV-2 target nucleic acids are NOT DETECTED.  The SARS-CoV-2 RNA is generally detectable in upper  respiratory specimens during the acute phase of infection. The lowest concentration of SARS-CoV-2 viral copies this assay can detect is 138 copies/mL. A negative result does not preclude SARS-Cov-2 infection and should not be used as the sole basis for treatment or other patient management decisions. A negative result may occur with  improper specimen collection/handling, submission of specimen other than nasopharyngeal swab, presence of viral mutation(s) within the areas targeted by this assay, and inadequate number of viral copies(<138 copies/mL). A negative result must be combined with clinical observations, patient history, and epidemiological information. The expected result is Negative.  Fact Sheet for Patients:  EntrepreneurPulse.com.au  Fact Sheet for Healthcare Providers:  IncredibleEmployment.be  This test is no t yet approved or cleared by the [place]  FDA and  has been authorized for detection and/or diagnosis of SARS-CoV-2 by FDA under an Emergency Use Authorization (EUA). This EUA will remain  in effect (meaning this test can be used) for the duration of the COVID-19 declaration under Section 564(b)(1) of the Act, 21 U.S.C.section 360bbb-3(b)(1), unless the authorization is terminated  or revoked sooner.       Influenza A by PCR NEGATIVE NEGATIVE Final   Influenza B by PCR NEGATIVE NEGATIVE Final    Comment: (NOTE) The Xpert Xpress SARS-CoV-2/FLU/RSV plus assay is intended as an aid in the diagnosis of influenza from Nasopharyngeal swab specimens and should not be used as a sole basis for treatment. Nasal washings and aspirates are unacceptable for Xpert Xpress SARS-CoV-2/FLU/RSV testing.  Fact Sheet for Patients: EntrepreneurPulse.com.au  Fact Sheet for Healthcare Providers: IncredibleEmployment.be  This test is not yet approved or cleared by the [place] FDA and has been  authorized for detection and/or diagnosis of SARS-CoV-2 by FDA under an Emergency Use Authorization (EUA). This EUA will remain in effect (meaning this test can be used) for the duration of the COVID-19 declaration under Section 564(b)(1) of the Act, 21 U.S.C. section 360bbb-3(b)(1), unless the authorization is terminated or revoked.  Performed at [facility], [address] [place], [place] [address]   CULTURE, BLOOD (ROUTINE X 2) w Reflex to ID Panel     Status: None   Collection Time: 11/13/20  1:01 PM   Specimen: BLOOD LEFT HAND  Result Value Ref Range Status   Specimen Description BLOOD LEFT HAND  Final   Special Requests   Final    BOTTLES DRAWN AEROBIC AND ANAEROBIC Blood Culture results may not be optimal due to an inadequate volume of blood received in culture bottles   Culture   Final    NO GROWTH 5 DAYS Performed at [facility], [address] [place], [place] [address]    Report Status 11/18/2020 FINAL  Final  CULTURE, BLOOD (ROUTINE X 2) w Reflex to ID Panel     Status: None   Collection Time: 11/13/20  1:03 PM   Specimen: BLOOD  Result Value Ref Range Status   Specimen Description BLOOD BLOOD LEFT WRIST  Final   Special Requests   Final    BOTTLES DRAWN AEROBIC ONLY Blood Culture results may not be optimal due to an inadequate volume of blood received in culture bottles   Culture   Final    NO GROWTH 5 DAYS Performed at [facility], [address] [place], [place] [address]    Report Status 11/18/2020 FINAL  Final  Resp Panel by RT-PCR (Flu A&B, Covid) Nasopharyngeal Swab     Status: None   Collection Time: 11/17/20  1:08 PM   Specimen: Nasopharyngeal Swab; Nasopharyngeal(NP) swabs in vial transport medium  Result Value Ref Range Status   SARS Coronavirus 2 by RT PCR NEGATIVE NEGATIVE Final    Comment: (NOTE) SARS-CoV-2 target nucleic acids are NOT DETECTED.  The SARS-CoV-2 RNA is generally detectable in upper  respiratory specimens during the acute phase of infection. The lowest concentration of SARS-CoV-2 viral copies this assay can detect is 138 copies/mL. A negative result does not preclude SARS-Cov-2 infection and should not be used as the sole basis for treatment or other patient management decisions. A negative result may occur with  improper specimen collection/handling, submission of specimen other than nasopharyngeal swab, presence of viral mutation(s) within the areas targeted by this assay, and inadequate number of viral  copies(<138 copies/mL). A negative result must be combined with clinical observations, patient history, and epidemiological information. The expected result is Negative.  Fact Sheet for Patients:  EntrepreneurPulse.com.au  Fact Sheet for Healthcare Providers:  IncredibleEmployment.be  This test is no t yet approved or cleared by the [place] FDA and  has been authorized for detection and/or diagnosis of SARS-CoV-2 by FDA under an Emergency Use Authorization (EUA). This EUA will remain  in effect (meaning this test can be used) for the duration of the COVID-19 declaration under Section 564(b)(1) of the Act, 21 U.S.C.section 360bbb-3(b)(1), unless the authorization is terminated  or revoked sooner.       Influenza A by PCR NEGATIVE NEGATIVE Final   Influenza B by PCR NEGATIVE NEGATIVE Final    Comment: (NOTE) The Xpert Xpress SARS-CoV-2/FLU/RSV plus assay is intended as an aid in the diagnosis of influenza from Nasopharyngeal swab specimens and should not be used as a sole basis for treatment. Nasal washings and aspirates are unacceptable for Xpert Xpress SARS-CoV-2/FLU/RSV testing.  Fact Sheet for Patients: EntrepreneurPulse.com.au  Fact Sheet for Healthcare Providers: IncredibleEmployment.be  This test is not yet approved or cleared by the [place] FDA and has been  authorized for detection and/or diagnosis of SARS-CoV-2 by FDA under an Emergency Use Authorization (EUA). This EUA will remain in effect (meaning this test can be used) for the duration of the COVID-19 declaration under Section 564(b)(1) of the Act, 21 U.S.C. section 360bbb-3(b)(1), unless the authorization is terminated or revoked.  Performed at [facility], [address] [place], [place] [address]      Labs: BNP (last 3 results) Recent Labs    05/28/20 2031 06/23/20 1236 11/13/20 1258  BNP 57.6 89.0 664.4*   Basic Metabolic Panel: Recent Labs  Lab 11/14/20 0613 11/17/20 0328 11/18/20 0458  NA 137 136 133*  K 3.7 4.6 3.7  CL 99 96* 96*  CO2 31 31 29   GLUCOSE 117* 119* 123*  BUN 37* 33* 35*  CREATININE 1.24* 1.46* 1.24*  CALCIUM 9.7 9.8 9.9   Liver Function Tests: No results for input(s): AST, ALT, ALKPHOS, BILITOT, PROT, ALBUMIN in the last 168 hours. No results for input(s): LIPASE, AMYLASE in the last 168 hours. No results for input(s): AMMONIA in the last 168 hours. CBC: Recent Labs  Lab 11/13/20 1258 11/17/20 0328 11/18/20 0458  WBC 7.3 11.0* 8.4  NEUTROABS 5.1  --   --   HGB 12.5 11.5* 10.1*  HCT 38.4 35.2* 30.8*  MCV 93.2 92.6 93.6  PLT 186 163 144*   Cardiac Enzymes: No results for input(s): CKTOTAL, CKMB, CKMBINDEX, TROPONINI in the last 168 hours. BNP: Invalid input(s): POCBNP CBG: No results for input(s): GLUCAP in the last 168 hours. D-Dimer No results for input(s): DDIMER in the last 72 hours. Hgb A1c No results for input(s): HGBA1C in the last 72 hours. Lipid Profile No results for input(s): CHOL, HDL, LDLCALC, TRIG, CHOLHDL, LDLDIRECT in the last 72 hours. Thyroid function studies No results for input(s): TSH, T4TOTAL, T3FREE, THYROIDAB in the last 72 hours.  Invalid input(s): FREET3 Anemia work up No results for input(s): VITAMINB12, FOLATE, FERRITIN, TIBC, IRON, RETICCTPCT in the last 72 hours. Urinalysis     Component Value Date/Time   COLORURINE YELLOW (A) 01/28/2020 1256   APPEARANCEUR HAZY (A) 01/28/2020 1256   APPEARANCEUR Cloudy 03/28/2014 1033   LABSPEC 1.016 01/28/2020 1256   LABSPEC 1.018 03/28/2014 1033   PHURINE 5.0 01/28/2020 1256   GLUCOSEU NEGATIVE 01/28/2020  [place] Negative 03/28/2014 1033   [place] 01/28/2020 [place] 01/28/2020 1256   BILIRUBINUR Negative 03/28/2014 [place] 01/28/2020 1256   PROTEINUR NEGATIVE 01/28/2020 1256   NITRITE NEGATIVE 01/28/2020 1256   LEUKOCYTESUR NEGATIVE 01/28/2020 1256   LEUKOCYTESUR 3+ 03/28/2014 1033   Sepsis Labs Invalid input(s): PROCALCITONIN,  WBC,  LACTICIDVEN Microbiology Recent Results (from the past 240 hour(s))  Resp Panel by RT-PCR (Flu A&B, Covid) Nasopharyngeal Swab     Status: None   Collection Time: 11/10/20  7:49 PM   Specimen: Nasopharyngeal Swab; Nasopharyngeal(NP) swabs in vial transport medium  Result Value Ref Range Status   SARS Coronavirus 2 by RT PCR NEGATIVE NEGATIVE Final    Comment: (NOTE) SARS-CoV-2 target nucleic acids are NOT DETECTED.  The SARS-CoV-2 RNA is generally detectable in upper respiratory specimens during the acute phase of infection. The lowest concentration of SARS-CoV-2 viral copies this assay can detect is 138 copies/mL. A negative result does not preclude SARS-Cov-2 infection and should not be used as the sole basis for treatment or other patient management decisions. A negative result may occur with  improper specimen collection/handling, submission of specimen other than nasopharyngeal swab, presence of viral mutation(s) within the areas targeted by this assay, and inadequate number of viral copies(<138 copies/mL). A negative result must be combined with clinical observations, patient history, and epidemiological information. The expected result is Negative.  Fact Sheet for Patients:   EntrepreneurPulse.com.au  Fact Sheet for Healthcare Providers:  IncredibleEmployment.be  This test is no t yet approved or cleared by the [place] FDA and  has been authorized for detection and/or diagnosis of SARS-CoV-2 by FDA under an Emergency Use Authorization (EUA). This EUA will remain  in effect (meaning this test can be used) for the duration of the COVID-19 declaration under Section 564(b)(1) of the Act, 21 U.S.C.section 360bbb-3(b)(1), unless the authorization is terminated  or revoked sooner.       Influenza A by PCR NEGATIVE NEGATIVE Final   Influenza B by PCR NEGATIVE NEGATIVE Final    Comment: (NOTE) The Xpert Xpress SARS-CoV-2/FLU/RSV plus assay is intended as an aid in the diagnosis of influenza from Nasopharyngeal swab specimens and should not be used as a sole basis for treatment. Nasal washings and aspirates are unacceptable for Xpert Xpress SARS-CoV-2/FLU/RSV testing.  Fact Sheet for Patients: EntrepreneurPulse.com.au  Fact Sheet for Healthcare Providers: IncredibleEmployment.be  This test is not yet approved or cleared by the [place] FDA and has been authorized for detection and/or diagnosis of SARS-CoV-2 by FDA under an Emergency Use Authorization (EUA). This EUA will remain in effect (meaning this test can be used) for the duration of the COVID-19 declaration under Section 564(b)(1) of the Act, 21 U.S.C. section 360bbb-3(b)(1), unless the authorization is terminated or revoked.  Performed at [facility] [facility], [address] [place], [place] [address]   CULTURE, BLOOD (ROUTINE X 2) w Reflex to ID Panel     Status: None   Collection Time: 11/13/20  1:01 PM   Specimen: BLOOD LEFT HAND  Result Value Ref Range Status   Specimen Description BLOOD LEFT HAND  Final   Special Requests   Final    BOTTLES DRAWN AEROBIC AND ANAEROBIC Blood Culture results may not be  optimal due to an inadequate volume of blood received in culture bottles   Culture   Final    NO GROWTH 5 DAYS Performed at [facility], [address] [place], [place] [address]    Report Status 11/18/2020 FINAL  Final  CULTURE, BLOOD (ROUTINE X 2) w Reflex to ID Panel     Status: None   Collection Time: 11/13/20  1:03 PM   Specimen: BLOOD  Result Value Ref Range Status   Specimen Description BLOOD BLOOD LEFT WRIST  Final   Special Requests   Final    BOTTLES DRAWN AEROBIC ONLY Blood Culture results may not be optimal due to an inadequate volume of blood received in culture bottles   Culture   Final    NO GROWTH 5 DAYS Performed at [facility], [address] [place], [place] [address]    Report Status 11/18/2020 FINAL  Final  Resp Panel by RT-PCR (Flu A&B, Covid) Nasopharyngeal Swab     Status: None   Collection Time: 11/17/20  1:08 PM   Specimen: Nasopharyngeal Swab; Nasopharyngeal(NP) swabs in vial transport medium  Result Value Ref Range Status   SARS Coronavirus 2 by RT PCR NEGATIVE NEGATIVE Final    Comment: (NOTE) SARS-CoV-2 target nucleic acids are NOT DETECTED.  The SARS-CoV-2 RNA is generally detectable in upper respiratory specimens during the acute phase of infection. The lowest concentration of SARS-CoV-2 viral copies this assay can detect is 138 copies/mL. A negative result does not preclude SARS-Cov-2 infection and should not be used as the sole basis for treatment or other patient management decisions. A negative result may occur with  improper specimen collection/handling, submission of specimen other than nasopharyngeal swab, presence of viral mutation(s) within the areas targeted by this assay, and inadequate number of viral copies(<138 copies/mL). A negative result must be combined with clinical observations, patient history, and epidemiological information. The expected result is Negative.  Fact Sheet for Patients:   EntrepreneurPulse.com.au  Fact Sheet for Healthcare Providers:  IncredibleEmployment.be  This test is no t yet approved or cleared by the [place] FDA and  has been authorized for detection and/or diagnosis of SARS-CoV-2 by FDA under an Emergency Use Authorization (EUA). This EUA will remain  in effect (meaning this test can be used) for the duration of the COVID-19 declaration under Section 564(b)(1) of the Act, 21 U.S.C.section 360bbb-3(b)(1), unless the authorization is terminated  or revoked sooner.       Influenza A by PCR NEGATIVE NEGATIVE Final   Influenza B by PCR NEGATIVE NEGATIVE Final    Comment: (NOTE) The Xpert Xpress SARS-CoV-2/FLU/RSV plus assay is intended as an aid in the diagnosis of influenza from Nasopharyngeal swab specimens and should not be used as a sole basis for treatment. Nasal washings and aspirates are unacceptable for Xpert Xpress SARS-CoV-2/FLU/RSV testing.  Fact Sheet for Patients: EntrepreneurPulse.com.au  Fact Sheet for Healthcare Providers: IncredibleEmployment.be  This test is not yet approved or cleared by the [place] FDA and has been authorized for detection and/or diagnosis of SARS-CoV-2 by FDA under an Emergency Use Authorization (EUA). This EUA will remain in effect (meaning this test can be used) for the duration of the COVID-19 declaration under Section 564(b)(1) of the Act, 21 U.S.C. section 360bbb-3(b)(1), unless the authorization is terminated or revoked.  Performed at [facility], [address], [place], [place] [address]      Time coordinating discharge: Over 30 minutes  SIGNED:   Sidney Ace, MD  [facility] 11/18/2020, 11:17 AM Pager   If 7PM-7AM, please contact night-coverage

## 2020-11-18 NOTE — Progress Notes (Signed)
Pt discharged to facility with husband via private vehicle. All belongings sent with pt. Report called to [place], LPN at [facility]. All questions and concerns addressed.

## 2020-11-19 ENCOUNTER — Encounter: Payer: Self-pay | Admitting: Adult Health

## 2020-11-19 ENCOUNTER — Non-Acute Institutional Stay (SKILLED_NURSING_FACILITY): Payer: Medicare Other | Admitting: Adult Health

## 2020-11-19 ENCOUNTER — Telehealth: Payer: Medicare Other

## 2020-11-19 DIAGNOSIS — G894 Chronic pain syndrome: Secondary | ICD-10-CM | POA: Diagnosis not present

## 2020-11-19 DIAGNOSIS — S43004S Unspecified dislocation of right shoulder joint, sequela: Secondary | ICD-10-CM | POA: Diagnosis not present

## 2020-11-19 DIAGNOSIS — I480 Paroxysmal atrial fibrillation: Secondary | ICD-10-CM | POA: Diagnosis not present

## 2020-11-19 DIAGNOSIS — M75121 Complete rotator cuff tear or rupture of right shoulder, not specified as traumatic: Secondary | ICD-10-CM | POA: Insufficient documentation

## 2020-11-19 DIAGNOSIS — Z96611 Presence of right artificial shoulder joint: Secondary | ICD-10-CM | POA: Insufficient documentation

## 2020-11-19 DIAGNOSIS — M069 Rheumatoid arthritis, unspecified: Secondary | ICD-10-CM | POA: Diagnosis not present

## 2020-11-19 DIAGNOSIS — J189 Pneumonia, unspecified organism: Secondary | ICD-10-CM

## 2020-11-19 DIAGNOSIS — I25119 Atherosclerotic heart disease of native coronary artery with unspecified angina pectoris: Secondary | ICD-10-CM | POA: Diagnosis not present

## 2020-11-19 DIAGNOSIS — I5032 Chronic diastolic (congestive) heart failure: Secondary | ICD-10-CM | POA: Diagnosis not present

## 2020-11-19 LAB — COMPREHENSIVE METABOLIC PANEL: Calcium: 10.1 (ref 8.7–10.7)

## 2020-11-19 LAB — BASIC METABOLIC PANEL
BUN: 26 — AB (ref 4–21)
CO2: 31 — AB (ref 13–22)
Chloride: 97 — AB (ref 99–108)
Creatinine: 1.1 (ref 0.5–1.1)
Glucose: 100
Potassium: 3.2 — AB (ref 3.4–5.3)
Sodium: 137 (ref 137–147)

## 2020-11-19 LAB — CBC: RBC: 3.8 — AB (ref 3.87–5.11)

## 2020-11-19 LAB — CBC AND DIFFERENTIAL
HCT: 34 — AB (ref 36–46)
Hemoglobin: 11.3 — AB (ref 12.0–16.0)
Platelets: 160 (ref 150–399)
WBC: 6.8

## 2020-11-19 NOTE — Progress Notes (Signed)
Location:  [facility] Room Number: 124-A Place of Service:  SNF (31) Provider:  Durenda Age, DNP, FNP-BC  Patient Care Team: Venia Carbon, MD as PCP - General (Internal Medicine) Minna Merritts, MD as PCP - Cardiology (Cardiology) Tanda Rockers, MD as Consulting Physician (Pulmonary Disease) Minna Merritts, MD as Consulting Physician (Cardiology) Dannielle Karvonen, RN as Case Manager  Extended Emergency Contact Information Primary Emergency Contact: Finis Bud Address: [address]          [place], [place] [address] Johnnette [place] of [place] Phone: [phone] Mobile Phone: [phone] Relation: Spouse Secondary Emergency Contact: Neely,Christina  [place] Phone: [phone] Mobile Phone: [phone] Relation: Daughter  Code Status:  FULL CODE  Goals of care: Advanced Directive information Advanced Directives 11/19/2020  Does Patient Have a Medical Advance Directive? No  Type of Advance Directive -  Does patient want to make changes to medical advance directive? No - Patient declined  Would patient like information on creating a medical advance directive? -  Pre-existing out of facility DNR order (yellow form or pink MOST form) -     Chief Complaint  Patient presents with   Hospitalization Follow-up    Hospital Follow Up.    HPI:  Pt is a 70 y.o. female who was admitted to [facility] on 11/18/20 post hospital admission 11/10/2020 to 11/18/2020.  She has a medical history significant for rheumatoid arthritis on Arava, CAD, paroxysmal atrial fibrillation on Eliquis, chronic diastolic heart failure, hypertension, chronic kidney disease is stage IIIa, COPD, sleep apnea and morbid obesity with BMI of 50.  She underwent reduction of right shoulder dislocation on 11/11/2020.  However, she continued to have right shoulder pain so she was taken back to the operating room on 11/16/2020  for reverse right total shoulder arthroplasty.  Hospitalization was complicated by persistent cough.  Chest x-ray was concerning for suspected left midlung pneumonia.  She was given Augmentin X 5 days. Pain control improved.  She was seen in her room today with daughter at bedside. Daughter stated that Cimzia is injected into bilateral shoulders and needs to be stopped for now due to S/P surgery of right shoulder.  Past Medical History:  Diagnosis Date   Arthritis    RA   Asthma    Basal cell carcinoma    Cataract 2018   bilateral eyes; corrected with surgery   Claustrophobia    Collagen vascular disease ([place])    RA   COPD (chronic obstructive pulmonary disease) (HCC)    Diastolic dysfunction    a. echo 07/2014: EF 55-60%, no RWMA, GR2DD, mild MR, LA moderately dilated, PASP 38 mm Hg   Dyslipidemia    Headache    migraines   Hemihypertrophy    History of cardiac cath    a. cardiac cath 05/24/2010 - nonobstructive CAD   History of gout    Hyperplastic colonic polyp 2003   Hypertension    Hypokalemia    Morbid obesity ([name])    PAF (paroxysmal atrial fibrillation) ([place])    a. on Pradaxa; b. CHADSVASc at least 2 (HTN & female)   Rheumatoid arthritis(714.0)    Sleep apnea    a. not compliant with CPAP   Past Surgical History:  Procedure Laterality Date   ABDOMINAL HYSTERECTOMY     APPLICATION OF WOUND VAC Right 08/09/2017   Procedure: APPLICATION OF WOUND VAC;  Surgeon: Albertine Patricia, DPM;  Location: [facility];  Service:  Podiatry;  Laterality: Right;   CARDIAC CATHETERIZATION  05/24/2010   nonobstructive CAD   CATARACT EXTRACTION W/ INTRAOCULAR LENS IMPLANT Right 06/12/2016   Dr. Darleen Crocker   CATARACT EXTRACTION W/ INTRAOCULAR LENS IMPLANT Left 06/26/2016   Dr. Darleen Crocker   CESAREAN SECTION     CHOLECYSTECTOMY     COLONOSCOPY  06/12/2011   Procedure: COLONOSCOPY;  Surgeon: Juanita Craver, MD;  Location: [facility];  Service: Endoscopy;  Laterality: N/A;   COLONOSCOPY  N/A 03/17/2013   Procedure: COLONOSCOPY;  Surgeon: Juanita Craver, MD;  Location: [facility];  Service: Endoscopy;  Laterality: N/A;   EYE SURGERY     FOOT ARTHRODESIS Right 07/13/2017   Procedure: ARTHRODESIS FOOT-MULTI.FUSIONS (6 JOINTS);  Surgeon: Albertine Patricia, DPM;  Location: [facility];  Service: Podiatry;  Laterality: Right;   IRRIGATION AND DEBRIDEMENT FOOT Right 08/09/2017   Procedure: IRRIGATION AND DEBRIDEMENT FOOT;  Surgeon: Albertine Patricia, DPM;  Location: [facility];  Service: Podiatry;  Laterality: Right;   KNEE ARTHROSCOPY     bilateral   LEFT HEART CATH AND CORONARY ANGIOGRAPHY N/A 06/04/2020   Procedure: LEFT HEART CATH AND CORONARY ANGIOGRAPHY;  Surgeon: Wellington Hampshire, MD;  Location: [facility];  Service: Cardiovascular;  Laterality: N/A;   REVERSE SHOULDER ARTHROPLASTY Right 11/16/2020   Procedure: REVERSE SHOULDER ARTHROPLASTY;  Surgeon: Corky Mull, MD;  Location: [facility];  Service: Orthopedics;  Laterality: Right;   SHOULDER CLOSED REDUCTION Right 11/11/2020   Procedure: CLOSED REDUCTION SHOULDER;  Surgeon: Corky Mull, MD;  Location: [facility];  Service: Orthopedics;  Laterality: Right;   SHOULDER INJECTION Right 11/11/2020   Procedure: SHOULDER INJECTION;  Surgeon: Corky Mull, MD;  Location: [facility];  Service: Orthopedics;  Laterality: Right;   SINUSOTOMY     TOOTH EXTRACTION  12/2016   VAGINAL HYSTERECTOMY      Allergies  Allergen Reactions   Fluoxetine Other (See Comments)    Headache, shaking, sleep issues Headache, shaking, sleep issues   Codeine     Nausea and vomiting/only when taking too much    Outpatient Encounter Medications as of 11/19/2020  Medication Sig   amiodarone (PACERONE) 200 MG tablet Take 1 tablet (200 mg total) by mouth daily. Take one tab daily.   apixaban (ELIQUIS) 5 MG TABS tablet TAKE 1 TABLET(5 MG) BY MOUTH TWICE DAILY   atorvastatin (LIPITOR) 20 MG tablet TAKE 1 TABLET BY MOUTH DAILY AT 6 PM   bisacodyl (DULCOLAX) 10  MG suppository Place 10 mg rectally as needed for moderate constipation.   Certolizumab Pegol (CIMZIA [place]) Inject into the skin every 30 (thirty) days.   gabapentin (NEURONTIN) 300 MG capsule Take 1 capsule (300 mg total) by mouth 4 (four) times daily.   HYDROcodone-acetaminophen (NORCO/VICODIN) 5-325 MG tablet Take 1-2 tablets by mouth every 4 (four) hours as needed for moderate pain.   ketoconazole (NIZORAL) 2 % cream APPLY A SMALL AMOUNT TO AFFECTED AREA ONCE A DAY TO RASH ON BUTTOCKS, GROIN   leflunomide (ARAVA) 20 MG tablet Take 20 mg by mouth daily.   Magnesium Hydroxide (MILK OF MAGNESIA PO) Take by mouth as needed.   meclizine (ANTIVERT) 25 MG tablet TAKE 1 TABLET(25 MG) BY MOUTH THREE TIMES DAILY AS NEEDED FOR DIZZINESS   metolazone (ZAROXOLYN) 2.5 MG tablet Take 2.5 mg by mouth daily as needed.   metoprolol succinate (TOPROL-XL) 50 MG 24 hr tablet TAKE 1 TABLET BY MOUTH EVERY DAY WITH OR IMMEDIATELY FOLLOWING A MEAL   montelukast (SINGULAIR) 10  MG tablet TAKE 1 TABLET BY MOUTH AT BEDTIME   nitroGLYCERIN (NITROSTAT) 0.4 MG SL tablet Place 1 tablet (0.4 mg total) under the tongue every 5 (five) minutes as needed for chest pain.   nystatin (MYCOSTATIN/NYSTOP) powder Apply 1 application topically 3 (three) times daily as needed.   ondansetron (ZOFRAN) 4 MG tablet Take 1 tablet (4 mg total) by mouth every 6 (six) hours as needed for nausea.   Oxycodone HCl 20 MG TABS Take 1 tablet (20 mg total) by mouth in the morning and at bedtime.   polyethylene glycol (MIRALAX / GLYCOLAX) packet Take 17 g by mouth daily as needed for mild constipation.   Potassium Chloride ER 20 MEQ TBCR Take 20 mEq by mouth daily at 12 noon.   SF 5000 PLUS 1.1 % CREA dental cream Place 1 application onto teeth 2 (two) times daily.   Sodium Phosphates (RA SALINE ENEMA RE) Place rectally as needed.   spironolactone (ALDACTONE) 25 MG tablet TAKE 1 TABLET(25 MG) BY MOUTH DAILY   terazosin (HYTRIN) 10 MG capsule TAKE 1  CAPSULE(10 MG) BY MOUTH AT BEDTIME   torsemide (DEMADEX) 20 MG tablet Take 40 mg by mouth daily. Takes an extra tablet when feeling SOB or has noticeable edema   [DISCONTINUED] mupirocin ointment (BACTROBAN) 2 % Apply 1 application topically 2 (two) times daily. Qd to burn wounds   No facility-administered encounter medications on file as of 11/19/2020.    Review of Systems  GENERAL: No change in appetite, no fatigue, no weight changes, no fever, chills  MOUTH and THROAT: Denies oral discomfort, gingival pain or bleeding, RESPIRATORY: no cough, SOB, DOE, wheezing, hemoptysis CARDIAC: No chest pain, edema or palpitations GI: No abdominal pain, diarrhea, constipation, heart burn, nausea or vomiting GU: Denies dysuria, frequency, hematuria, incontinence, or discharge NEUROLOGICAL: Denies dizziness, syncope, numbness, or headache PSYCHIATRIC: Denies feelings of depression or anxiety. No report of hallucinations, insomnia, paranoia, or agitation   Immunization History  Administered Date(s) Administered   Fluad Quad(high Dose 65+) 11/28/2018, 12/19/2019, 11/13/2020   Influenza Split 11/28/2014   Influenza,inj,Quad PF,6+ Mos 11/20/2012, 12/14/2015, 11/16/2016, 11/26/2017   Influenza-Unspecified 12/11/2013   Moderna Sars-Covid-2 Vaccination 04/07/2019, 05/05/2019, 08/06/2020   Pneumococcal Conjugate-13 06/15/2015   Pneumococcal Polysaccharide-23 01/05/2014   Zoster, Live 05/09/2011   Pertinent  Health Maintenance Due  Topic Date Due   MAMMOGRAM  03/11/2021   COLONOSCOPY (Pts 45-32yr Insurance coverage will need to be confirmed)  03/18/2023   INFLUENZA VACCINE  Completed   DEXA SCAN  Completed   Fall Risk  07/06/2020 03/10/2020 03/10/2020 01/19/2020 01/08/2020  Falls in the past year? '1 1 1 1 1  ' Comment - - - - -  Number falls in past yr: '1 1 1 1 1  ' Injury with Fall? 0 0 0 0 0  Risk Factor Category  - - - - -  Risk for fall due to : History of fall(s) Impaired mobility;Impaired  balance/gait;History of fall(s) Impaired mobility;Impaired balance/gait;History of fall(s) History of fall(s);Impaired balance/gait Impaired mobility;Impaired balance/gait;History of fall(s)  Risk for fall due to: Comment - - - - -  Follow up Falls prevention discussed Falls prevention discussed;Education provided;Falls evaluation completed Falls prevention discussed;Education provided;Falls evaluation completed Falls evaluation completed Falls prevention discussed;Education provided;Falls evaluation completed     Vitals:   11/19/20 0841  BP: (!) 146/76  Pulse: 88  Resp: 20  Temp: (!) 97.2 F (36.2 C)  Weight: (!) 350 lb (158.8 kg)  Height: '5\' 3"'  (1.6 m)  Body mass index is 62 kg/m.  Physical Exam  GENERAL APPEARANCE: Well nourished. In no acute distress. Morbidly obese. SKIN:  Right shoulder surgical wound with honeycomb dressing, dry  MOUTH and THROAT: Lips are without lesions. Oral mucosa is moist and without lesions.  RESPIRATORY: Breathing is even & unlabored, BS CTAB CARDIAC: RRR, no murmur,no extra heart sounds, no edema GI: Abdomen soft, normal BS, no masses, no tenderness, EXTREMITIES:  Has right shoulder immobilizer. NEUROLOGICAL: There is no tremor. Speech is clear. Alert and oriented X 3.  PSYCHIATRIC: Affect and behavior are appropriate  Labs reviewed: Recent Labs    02/03/20 1042 05/28/20 2031 05/29/20 0243 06/02/20 0401 06/23/20 1236 10/12/20 1157 11/10/20 2012 11/14/20 0613 11/17/20 0328 11/18/20 0458  NA 138 135   < > 134*   < > 138   < > 137 136 133*  K 3.8 3.4*   < > 3.8   < > 3.8   < > 3.7 4.6 3.7  CL 98 94*   < > 92*   < > 99   < > 99 96* 96*  CO2 28 27   < > 31   < > 31   < > '31 31 29  ' GLUCOSE 99 127*   < > 100*   < > 87   < > 117* 119* 123*  BUN 26* 22   < > 30*   < > 19   < > 37* 33* 35*  CREATININE 1.05 1.10*   < > 1.10*   < > 1.17   < > 1.24* 1.46* 1.24*  CALCIUM 11.2* 10.1   < > 10.1   < > 10.7*   < > 9.7 9.8 9.9  MG  --  1.7  --  1.9   --   --   --   --   --   --   PHOS 3.1  --   --   --   --  2.5  --   --   --   --    < > = values in this interval not displayed.   Recent Labs    06/01/20 0402 06/02/20 0401 06/29/20 1028 10/12/20 1157  AST '25 24 27  ' --   ALT '17 18 17  ' --   ALKPHOS 93 83 107  --   BILITOT 1.2 1.4* 1.1  --   PROT 6.7 6.3* 6.3  --   ALBUMIN 3.6 3.4* 4.0 3.9   Recent Labs    05/28/20 2031 05/31/20 0342 11/10/20 2012 11/13/20 1258 11/17/20 0328 11/18/20 0458  WBC 9.6   < > 7.0 7.3 11.0* 8.4  NEUTROABS 5.4  --  4.2 5.1  --   --   HGB 13.6   < > 12.7 12.5 11.5* 10.1*  HCT 42.7   < > 39.4 38.4 35.2* 30.8*  MCV 90.9   < > 94.3 93.2 92.6 93.6  PLT 253   < > 165 186 163 144*   < > = values in this interval not displayed.   Lab Results  Component Value Date   TSH 4.149 12/28/2017   Lab Results  Component Value Date   HGBA1C (H) 05/24/2010    6.3 (NOTE)  According to the ADA Clinical Practice Recommendations for 2011, when HbA1c is used as a screening test:   >=6.5%   Diagnostic of Diabetes Mellitus           (if abnormal result  is confirmed)  5.7-6.4%   Increased risk of developing Diabetes Mellitus  References:Diagnosis and Classification of Diabetes Mellitus,Diabetes XJOI,3254,98(YMEBR 1):S62-S69 and Standards of Medical Care in         Diabetes - 2011,Diabetes AXEN,4076,80  (Suppl 1):S11-S61.   Lab Results  Component Value Date   CHOL 113 08/27/2019   HDL 43.70 08/27/2019   LDLCALC 56 08/27/2019   TRIG 70.0 08/27/2019   CHOLHDL 3 08/27/2019    Significant Diagnostic Results in last 30 days:  DG Shoulder Right  Result Date: 11/10/2020 CLINICAL DATA:  dislocation EXAM: RIGHT SHOULDER - 2+ VIEW COMPARISON:  Shoulder x-ray dated September 23, 2018; chest x-ray dated May 28, 2020 FINDINGS: Right anterior shoulder dislocation. No definite fracture. Mild degenerative changes of the acromioclavicular joint. Mild interstitial  opacities of the visualized lung, similar to prior chest x-ray. IMPRESSION: Right anterior shoulder dislocation. No definite fracture, although evaluation is limited due to overlying soft tissues. Correlate with shoulder CT. Electronically Signed   By: Yetta Glassman M.D.   On: 11/10/2020 19:55   CT Shoulder Right Wo Contrast  Result Date: 11/10/2020 CLINICAL DATA:  Right shoulder dislocation. EXAM: CT OF THE UPPER RIGHT EXTREMITY WITHOUT CONTRAST TECHNIQUE: Multidetector CT imaging of the upper right extremity was performed according to the standard protocol. COMPARISON:  Right shoulder x-rays from same day. FINDINGS: Bones/Joint/Cartilage Anterior dislocation of the humeral head with respect to the glenoid. Impaction fracture of the posterosuperior humeral head (series 6, image 46). The glenoid appears intact. Moderate acromioclavicular osteoarthritis. No joint effusion. Large amount of fluid in the subacromial/subdeltoid bursa. Ligaments Ligaments are suboptimally evaluated by CT. Muscles and Tendons Grossly intact.  No muscle atrophy. Soft tissue No fluid collection or hematoma.  No soft tissue mass. IMPRESSION: 1. Anterior shoulder dislocation with impaction fracture of the posterosuperior humeral head. 2. Severe subacromial/subdeltoid bursitis. Electronically Signed   By: Titus Dubin M.D.   On: 11/10/2020 20:18   DG Chest Port 1 View  Result Date: 11/13/2020 CLINICAL DATA:  Cough. EXAM: PORTABLE CHEST 1 VIEW COMPARISON:  May 28, 2020 FINDINGS: Mild opacity in the left mid lung suspected. The heart, hila, mediastinum, and pleura are normal. No other acute abnormalities. IMPRESSION: Suspected mild opacity in left mid lung may represent subtle pneumonia. Recommend short-term follow-up imaging after treatment. Electronically Signed   By: Dorise Bullion III M.D.   On: 11/13/2020 11:31   DG Shoulder Right Port  Result Date: 11/16/2020 CLINICAL DATA:  Status post reverse arthroplasty. EXAM:  PORTABLE RIGHT SHOULDER COMPARISON:  Radiograph 11/10/2020 FINDINGS: Reverse right shoulder arthroplasty in expected alignment. No periprosthetic lucency or fracture. Recent postsurgical change includes soft tissue edema and skin staples in place. IMPRESSION: Reverse right shoulder arthroplasty without immediate postoperative complication. Electronically Signed   By: Keith Rake M.D.   On: 11/16/2020 17:34   DG Humerus Right  Result Date: 11/11/2020 CLINICAL DATA:  surgery EXAM: DG C-ARM 1-60 MIN; RIGHT HUMERUS - 2+ VIEW FLUOROSCOPY TIME:  Fluoroscopy Time:  33 seconds. Radiation Exposure Index (if provided by the fluoroscopic device): 5.65 mGy. Number of Acquired Spot Images: 2 COMPARISON:  11/10/2020. FINDINGS: Two C-arm fluoroscopic images were obtained intraoperatively and submitted for post operative interpretation. Alignment appears improved when compared to 11/10/2020 radiographs; however, limited assessment  of alignment due to single provided projection. Please see the performing provider's procedural report for further detail. IMPRESSION: 1. Intraoperative fluoroscopy during close reduction of right shoulder dislocation. Alignment appears improved when compared to 11/10/2020 radiographs; however, limited assessment of alignment due to single provided projection. Dedicated shoulder radiographs could confirm reduction if clinically indicated. 2. Please see recent CT of the shoulder for characterization of impaction fracture. Electronically Signed   By: Margaretha Sheffield M.D.   On: 11/11/2020 14:46   DG C-Arm 1-60 Min  Result Date: 11/11/2020 CLINICAL DATA:  surgery EXAM: DG C-ARM 1-60 MIN; RIGHT HUMERUS - 2+ VIEW FLUOROSCOPY TIME:  Fluoroscopy Time:  33 seconds. Radiation Exposure Index (if provided by the fluoroscopic device): 5.65 mGy. Number of Acquired Spot Images: 2 COMPARISON:  11/10/2020. FINDINGS: Two C-arm fluoroscopic images were obtained intraoperatively and submitted for post  operative interpretation. Alignment appears improved when compared to 11/10/2020 radiographs; however, limited assessment of alignment due to single provided projection. Please see the performing provider's procedural report for further detail. IMPRESSION: 1. Intraoperative fluoroscopy during close reduction of right shoulder dislocation. Alignment appears improved when compared to 11/10/2020 radiographs; however, limited assessment of alignment due to single provided projection. Dedicated shoulder radiographs could confirm reduction if clinically indicated. 2. Please see recent CT of the shoulder for characterization of impaction fracture. Electronically Signed   By: Margaretha Sheffield M.D.   On: 11/11/2020 14:46    Assessment/Plan  1. Closed dislocation of right shoulder, sequela -  S/P reduction of right shoulder dislocation on 11/11/2020 -   S/P reverse right total shoulder arthroplasty on 11/16/2020 -  follow up with orthopedics in 14 days for staple removal  2. Pneumonia of left lung due to infectious organism, unspecified part of lung -  was given Augmentin X 5 days  3. Paroxysmal A-fib (HCC) -  continue Eliquis for anticoagulation and rate control with metoprolol and amiodarone  4. Rheumatoid arthritis flare ([place]) -   Continue Arava  5. Chronic diastolic heart failure (HCC) -   Continue spironolactone, PRN Torsemide and PRN metolazone  6. Coronary artery disease involving native coronary artery of native heart with angina pectoris (HCC) -  stable, continue NTG PRN for chest pain, on Eliquis  7. Chronic pain syndrome -  continue Norco PRN will be decreased from every 4 hours to every 6 hours and Oxycodone Hcl 20 mg twice a day -  decrease Neurontin 300 mg every 4 hours to nightly    Family/ staff Communication:   Discussed plan of care with resident, daughter and charge nurse.  Labs/tests ordered: CBC with differential and BMP and EGFR  Goals of care:   Short-term  care   Durenda Age, DNP, MSN, FNP-BC [facility] [phone] (Monday-Friday 8:00 a.m. - 5:00 p.m.) [phone] (after hours)

## 2020-11-20 ENCOUNTER — Other Ambulatory Visit: Payer: Self-pay | Admitting: Adult Health

## 2020-11-20 MED ORDER — OXYCODONE HCL 20 MG PO TABS: 1.0000 | ORAL_TABLET | Freq: Two times a day (BID) | ORAL | 0 refills | Status: DC

## 2020-11-23 ENCOUNTER — Non-Acute Institutional Stay (SKILLED_NURSING_FACILITY): Payer: Medicare Other | Admitting: Family Medicine

## 2020-11-23 ENCOUNTER — Encounter: Payer: Self-pay | Admitting: Family Medicine

## 2020-11-23 DIAGNOSIS — I5032 Chronic diastolic (congestive) heart failure: Secondary | ICD-10-CM | POA: Diagnosis not present

## 2020-11-23 DIAGNOSIS — Z6841 Body Mass Index (BMI) 40.0 and over, adult: Secondary | ICD-10-CM | POA: Diagnosis not present

## 2020-11-23 DIAGNOSIS — J449 Chronic obstructive pulmonary disease, unspecified: Secondary | ICD-10-CM | POA: Diagnosis not present

## 2020-11-23 DIAGNOSIS — Z7901 Long term (current) use of anticoagulants: Secondary | ICD-10-CM

## 2020-11-23 DIAGNOSIS — I480 Paroxysmal atrial fibrillation: Secondary | ICD-10-CM

## 2020-11-23 DIAGNOSIS — I25119 Atherosclerotic heart disease of native coronary artery with unspecified angina pectoris: Secondary | ICD-10-CM | POA: Diagnosis not present

## 2020-11-23 DIAGNOSIS — M069 Rheumatoid arthritis, unspecified: Secondary | ICD-10-CM

## 2020-11-23 LAB — BASIC METABOLIC PANEL
BUN: 31 — AB (ref 4–21)
CO2: 28 — AB (ref 13–22)
Chloride: 97 — AB (ref 99–108)
Creatinine: 1.4 — AB (ref 0.5–1.1)
Glucose: 100
Potassium: 4.1 (ref 3.4–5.3)
Sodium: 139 (ref 137–147)

## 2020-11-23 LAB — COMPREHENSIVE METABOLIC PANEL
Calcium: 11 — AB (ref 8.7–10.7)
GFR calc Af Amer: 43.53
GFR calc non Af Amer: 37.56

## 2020-11-23 NOTE — Progress Notes (Signed)
Provider:  Alain Honey, MD Location:  [facility] Room Number: 124 A Place of Service:  SNF (31)  PCP: Venia Carbon, MD Patient Care Team: Venia Carbon, MD as PCP - General (Internal Medicine) Minna Merritts, MD as PCP - Cardiology (Cardiology) Tanda Rockers, MD as Consulting Physician (Pulmonary Disease) Minna Merritts, MD as Consulting Physician (Cardiology) Dannielle Karvonen, RN as Case Manager  Extended Emergency Contact Information Primary Emergency Contact: Finis Bud Address: [address]          [place], [place] [address] Johnnette Litter of [place] Phone: [phone] Mobile Phone: [phone] Relation: Spouse Secondary Emergency Contact: Neely,Christina  [place] Phone: [phone] Mobile Phone: [phone] Relation: Daughter  Code Status:  Goals of Care: Advanced Directive information Advanced Directives 11/23/2020  Does Patient Have a Medical Advance Directive? No  Type of Advance Directive -  Does patient want to make changes to medical advance directive? -  Would patient like information on creating a medical advance directive? -  Pre-existing out of facility DNR order (yellow form or pink MOST form) -      Chief Complaint  Patient presents with   New Admit To SNF    New admission    HPI: Patient is a 70 y.o. female seen today for admission to [facility]..  This patient was admitted on 922 after being discharged from the hospital.  [facility] to 11/18/2020.  She was admitted to the [facility] from [facility] for management of right shoulder dislocation.  History of was that she had reached upward about 2 weeks prior to admission and felt pain in her right shoulder.  It she continued to do her housework but pain worsened over the next several days and she presented to urgent care where she was found to have a right shoulder  dislocation.  She was referred to the emergency room where she underwent reduction of the right shoulder on 915.  There was some initial improvement but then right shoulder pain worsened and she was taken to the operating room on 11/16/2020 for reverse right total shoulder arthroplasty.  During her hospitalization she had cough and chest x-ray showed possible left midlung pneumonia although white count although white count and other labs such as procalcitonin and lactic acid were normal.  She completed a 5-day course of Augmentin.  She is referred to skilled nursing for rehab of her shoulder.  Other medical problems include rheumatoid arthritis, coronary artery disease, paroxysmal atrial fibrillation, chronic diastolic heart failure, hypertension, stage IIIa chronic kidney disease, COPD, sleep apnea, and morbid obesity with BMI of 50.  Past Medical History:  Diagnosis Date   Arthritis    RA   Asthma    Basal cell carcinoma    Cataract 2018   bilateral eyes; corrected with surgery   Claustrophobia    Collagen vascular disease ([place])    RA   COPD (chronic obstructive pulmonary disease) (HCC)    Diastolic dysfunction    a. echo 07/2014: EF 55-60%, no RWMA, GR2DD, mild MR, LA moderately dilated, PASP 38 mm Hg   Dyslipidemia    Headache    migraines   Hemihypertrophy    History of cardiac cath    a. cardiac cath 05/24/2010 - nonobstructive CAD   History of gout    Hyperplastic colonic polyp 2003   Hypertension    Hypokalemia    Morbid obesity ([place])  PAF (paroxysmal atrial fibrillation) (HCC)    a. on Pradaxa; b. CHADSVASc at least 2 (HTN & female)   Rheumatoid arthritis(714.0)    Sleep apnea    a. not compliant with CPAP   Past Surgical History:  Procedure Laterality Date   ABDOMINAL HYSTERECTOMY     APPLICATION OF WOUND VAC Right 08/09/2017   Procedure: APPLICATION OF WOUND VAC;  Surgeon: Albertine Patricia, DPM;  Location: [facility];  Service: Podiatry;  Laterality: Right;   CARDIAC  CATHETERIZATION  05/24/2010   nonobstructive CAD   CATARACT EXTRACTION W/ INTRAOCULAR LENS IMPLANT Right 06/12/2016   Dr. Darleen Crocker   CATARACT EXTRACTION W/ INTRAOCULAR LENS IMPLANT Left 06/26/2016   Dr. Darleen Crocker   CESAREAN SECTION     CHOLECYSTECTOMY     COLONOSCOPY  06/12/2011   Procedure: COLONOSCOPY;  Surgeon: Juanita Craver, MD;  Location: [facility];  Service: Endoscopy;  Laterality: N/A;   COLONOSCOPY N/A 03/17/2013   Procedure: COLONOSCOPY;  Surgeon: Juanita Craver, MD;  Location: [facility];  Service: Endoscopy;  Laterality: N/A;   EYE SURGERY     FOOT ARTHRODESIS Right 07/13/2017   Procedure: ARTHRODESIS FOOT-MULTI.FUSIONS (6 JOINTS);  Surgeon: Albertine Patricia, DPM;  Location: [facility];  Service: Podiatry;  Laterality: Right;   IRRIGATION AND DEBRIDEMENT FOOT Right 08/09/2017   Procedure: IRRIGATION AND DEBRIDEMENT FOOT;  Surgeon: Albertine Patricia, DPM;  Location: [facility];  Service: Podiatry;  Laterality: Right;   KNEE ARTHROSCOPY     bilateral   LEFT HEART CATH AND CORONARY ANGIOGRAPHY N/A 06/04/2020   Procedure: LEFT HEART CATH AND CORONARY ANGIOGRAPHY;  Surgeon: Wellington Hampshire, MD;  Location: [facility];  Service: Cardiovascular;  Laterality: N/A;   REVERSE SHOULDER ARTHROPLASTY Right 11/16/2020   Procedure: REVERSE SHOULDER ARTHROPLASTY;  Surgeon: Corky Mull, MD;  Location: [facility];  Service: Orthopedics;  Laterality: Right;   SHOULDER CLOSED REDUCTION Right 11/11/2020   Procedure: CLOSED REDUCTION SHOULDER;  Surgeon: Corky Mull, MD;  Location: [facility];  Service: Orthopedics;  Laterality: Right;   SHOULDER INJECTION Right 11/11/2020   Procedure: SHOULDER INJECTION;  Surgeon: Corky Mull, MD;  Location: [facility];  Service: Orthopedics;  Laterality: Right;   SINUSOTOMY     TOOTH EXTRACTION  12/2016   VAGINAL HYSTERECTOMY      reports that she has never smoked. She has never used smokeless tobacco. She reports that she does not drink alcohol and does  not use drugs. Social History   Socioeconomic History   Marital status: Married    Spouse name: Not on file   Number of children: 1   Years of education: Not on file   Highest education level: Not on file  Occupational History   Occupation: Taxpayer service    Employer: [employer]    Comment: Retired 2007  Tobacco Use   Smoking status: Never   Smokeless tobacco: Never  Vaping Use   Vaping Use: Never used  Substance and Sexual Activity   Alcohol use: No   Drug use: No   Sexual activity: Not Currently  Other Topics Concern   Not on file  Social History Narrative   No living will   Husband, then daughter should be decision maker   Would accept resuscitation attempts   Not sure about tube feeds   Social Determinants of Health   Financial Resource Strain: Not on file  Food Insecurity: No Food Insecurity   Worried About [place] in the Last Year: Never true  Ran Out of Food in the Last Year: Never true  Transportation Needs: No Transportation Needs   Lack of Transportation (Medical): No   Lack of Transportation (Non-Medical): No  Physical Activity: Not on file  Stress: Not on file  Social Connections: Not on file  Intimate Partner Violence: Not on file    Functional Status Survey:    Family History  Problem Relation Age of Onset   Emphysema Father        smoked   Arthritis Father    Tuberculosis Mother    Parkinsonism Mother    Cancer Sister    Diabetes type II Sister    Breast cancer Sister    Breast cancer Maternal Aunt    Tuberculosis Sister     Health Maintenance  Topic Date Due   Zoster Vaccines- Shingrix (1 of 2) Never done   TETANUS/TDAP  06/17/2019   COVID-19 Vaccine (4 - Booster for Moderna series) 10/29/2020   MAMMOGRAM  03/11/2021   COLONOSCOPY (Pts 45-43yrs Insurance coverage will need to be confirmed)  03/18/2023   INFLUENZA VACCINE  Completed   DEXA SCAN  Completed   Hepatitis C Screening  Completed   HPV VACCINES  Aged Out     Allergies  Allergen Reactions   Fluoxetine Other (See Comments)    Headache, shaking, sleep issues Headache, shaking, sleep issues   Codeine     Nausea and vomiting/only when taking too much    Allergies as of 11/23/2020       Reactions   Fluoxetine Other (See Comments)   Headache, shaking, sleep issues Headache, shaking, sleep issues   Codeine    Nausea and vomiting/only when taking too much        Medication List        Accurate as of November 23, 2020  4:22 PM. If you have any questions, ask your nurse or doctor.          STOP taking these medications    CIMZIA Shaw Stopped by: Alain Honey, MD       TAKE these medications    amiodarone 200 MG tablet Commonly known as: PACERONE Take 1 tablet (200 mg total) by mouth daily. Take one tab daily.   apixaban 5 MG Tabs tablet Commonly known as: Eliquis TAKE 1 TABLET(5 MG) BY MOUTH TWICE DAILY   atorvastatin 40 MG tablet Commonly known as: LIPITOR Take 40 mg by mouth daily. What changed: Another medication with the same name was removed. Continue taking this medication, and follow the directions you see here. Changed by: Alain Honey, MD   bisacodyl 10 MG suppository Commonly known as: DULCOLAX Place 10 mg rectally as needed for moderate constipation.   gabapentin 300 MG capsule Commonly known as: NEURONTIN Take 1 capsule (300 mg total) by mouth 4 (four) times daily.   HYDROcodone-acetaminophen 5-325 MG tablet Commonly known as: NORCO/VICODIN Take 1-2 tablets by mouth every 4 (four) hours as needed for moderate pain.   ketoconazole 2 % cream Commonly known as: NIZORAL APPLY A SMALL AMOUNT TO AFFECTED AREA ONCE A DAY TO RASH ON BUTTOCKS, GROIN   leflunomide 20 MG tablet Commonly known as: ARAVA Take 20 mg by mouth daily.   meclizine 25 MG tablet Commonly known as: ANTIVERT TAKE 1 TABLET(25 MG) BY MOUTH THREE TIMES DAILY AS NEEDED FOR DIZZINESS   metolazone 2.5 MG tablet Commonly known  as: ZAROXOLYN Take 2.5 mg by mouth daily as needed.   metoprolol succinate 50 MG 24 hr tablet Commonly  known as: TOPROL-XL TAKE 1 TABLET BY MOUTH EVERY DAY WITH OR IMMEDIATELY FOLLOWING A MEAL   MILK OF MAGNESIA PO Take by mouth as needed.   montelukast 10 MG tablet Commonly known as: SINGULAIR TAKE 1 TABLET BY MOUTH AT BEDTIME   nitroGLYCERIN 0.4 MG SL tablet Commonly known as: NITROSTAT Place 1 tablet (0.4 mg total) under the tongue every 5 (five) minutes as needed for chest pain.   nystatin powder Commonly known as: MYCOSTATIN/NYSTOP Apply 1 application topically 3 (three) times daily as needed.   ondansetron 4 MG tablet Commonly known as: ZOFRAN Take 1 tablet (4 mg total) by mouth every 6 (six) hours as needed for nausea.   Oxycodone HCl 20 MG Tabs Take 1 tablet (20 mg total) by mouth in the morning and at bedtime.   polyethylene glycol 17 g packet Commonly known as: MIRALAX / GLYCOLAX Take 17 g by mouth daily as needed for mild constipation.   Potassium Chloride ER 20 MEQ Tbcr Take 20 mEq by mouth daily at 12 noon.   PRO-STAT PO Take 30 mLs by mouth.   RA SALINE ENEMA RE Place rectally as needed.   SF 5000 Plus 1.1 % Crea dental cream Generic drug: sodium fluoride Place 1 application onto teeth 2 (two) times daily.   spironolactone 25 MG tablet Commonly known as: ALDACTONE TAKE 1 TABLET(25 MG) BY MOUTH DAILY   terazosin 10 MG capsule Commonly known as: HYTRIN TAKE 1 CAPSULE(10 MG) BY MOUTH AT BEDTIME   torsemide 20 MG tablet Commonly known as: DEMADEX Take 40 mg by mouth daily. Takes an extra tablet when feeling SOB or has noticeable edema        Review of Systems  Constitutional:  Positive for activity change.  HENT:  Positive for congestion.   Respiratory:  Positive for cough.   Cardiovascular:  Positive for palpitations.  Musculoskeletal:  Positive for arthralgias and gait problem.  Skin:        Decubitus ulcer on sacral region stage II    Vitals:   11/23/20 1458  BP: 125/61  Pulse: (!) 56  Resp: (!) 21  Temp: (!) 97.5 F (36.4 C)  Weight: 293 lb 8 oz (133.1 kg)  Height: 5\' 3"  (1.6 m)   Body mass index is 51.99 kg/m. Physical Exam Vitals and nursing note reviewed.  Constitutional:      Appearance: She is obese.  HENT:     Head: Normocephalic.     Right Ear: Tympanic membrane normal.     Left Ear: Tympanic membrane normal.  Cardiovascular:     Rate and Rhythm: Normal rate and regular rhythm.  Pulmonary:     Effort: Pulmonary effort is normal.     Breath sounds: Normal breath sounds.  Musculoskeletal:     Cervical back: Normal range of motion.     Comments: Right shoulder contained in a sling and immobilizer  Skin:    General: Skin is warm.     Findings: Lesion present.     Comments: Sacral ulcer stage II  Neurological:     General: No focal deficit present.     Mental Status: She is alert and oriented to person, place, and time.  Psychiatric:        Mood and Affect: Mood normal.        Thought Content: Thought content normal.    Labs reviewed: Basic Metabolic Panel: Recent Labs    02/03/20 1042 05/28/20 2031 05/29/20 0243 06/02/20 0401 06/23/20 1236 10/12/20 1157 11/10/20  2012 11/14/20 0613 11/17/20 0328 11/18/20 0458  NA 138 135   < > 134*   < > 138   < > 137 136 133*  K 3.8 3.4*   < > 3.8   < > 3.8   < > 3.7 4.6 3.7  CL 98 94*   < > 92*   < > 99   < > 99 96* 96*  CO2 28 27   < > 31   < > 31   < > 31 31 29   GLUCOSE 99 127*   < > 100*   < > 87   < > 117* 119* 123*  BUN 26* 22   < > 30*   < > 19   < > 37* 33* 35*  CREATININE 1.05 1.10*   < > 1.10*   < > 1.17   < > 1.24* 1.46* 1.24*  CALCIUM 11.2* 10.1   < > 10.1   < > 10.7*   < > 9.7 9.8 9.9  MG  --  1.7  --  1.9  --   --   --   --   --   --   PHOS 3.1  --   --   --   --  2.5  --   --   --   --    < > = values in this interval not displayed.   Liver Function Tests: Recent Labs    06/01/20 0402 06/02/20 0401 06/29/20 1028  10/12/20 1157  AST 25 24 27   --   ALT 17 18 17   --   ALKPHOS 93 83 107  --   BILITOT 1.2 1.4* 1.1  --   PROT 6.7 6.3* 6.3  --   ALBUMIN 3.6 3.4* 4.0 3.9   No results for input(s): LIPASE, AMYLASE in the last 8760 hours. No results for input(s): AMMONIA in the last 8760 hours. CBC: Recent Labs    05/28/20 2031 05/31/20 0342 11/10/20 2012 11/13/20 1258 11/17/20 0328 11/18/20 0458  WBC 9.6   < > 7.0 7.3 11.0* 8.4  NEUTROABS 5.4  --  4.2 5.1  --   --   HGB 13.6   < > 12.7 12.5 11.5* 10.1*  HCT 42.7   < > 39.4 38.4 35.2* 30.8*  MCV 90.9   < > 94.3 93.2 92.6 93.6  PLT 253   < > 165 186 163 144*   < > = values in this interval not displayed.   Cardiac Enzymes: No results for input(s): CKTOTAL, CKMB, CKMBINDEX, TROPONINI in the last 8760 hours. BNP: Invalid input(s): POCBNP Lab Results  Component Value Date   HGBA1C (H) 05/24/2010    6.3 (NOTE)                                                                       According to the ADA Clinical Practice Recommendations for 2011, when HbA1c is used as a screening test:   >=6.5%   Diagnostic of Diabetes Mellitus           (if abnormal result  is confirmed)  5.7-6.4%   Increased risk of developing Diabetes Mellitus  References:Diagnosis and Classification of Diabetes Mellitus,Diabetes IWOE,3212,24(MGNOI 1):S62-S69 and Standards of Medical  Care in         Diabetes - 2011,Diabetes Care,2011,34  (Suppl 1):S11-S61.   Lab Results  Component Value Date   TSH 4.149 12/28/2017   Lab Results  Component Value Date   [id number] 250 09/08/2014   No results found for: FOLATE Lab Results  Component Value Date   IRON 43 08/19/2012   FERRITIN 43.6 09/08/2014    Imaging and Procedures obtained prior to SNF admission: DG Shoulder Right  Result Date: 11/10/2020 CLINICAL DATA:  dislocation EXAM: RIGHT SHOULDER - 2+ VIEW COMPARISON:  Shoulder x-ray dated September 23, 2018; chest x-ray dated May 28, 2020 FINDINGS: Right anterior shoulder  dislocation. No definite fracture. Mild degenerative changes of the acromioclavicular joint. Mild interstitial opacities of the visualized lung, similar to prior chest x-ray. IMPRESSION: Right anterior shoulder dislocation. No definite fracture, although evaluation is limited due to overlying soft tissues. Correlate with shoulder CT. Electronically Signed   By: Yetta Glassman M.D.   On: 11/10/2020 19:55   CT Shoulder Right Wo Contrast  Result Date: 11/10/2020 CLINICAL DATA:  Right shoulder dislocation. EXAM: CT OF THE UPPER RIGHT EXTREMITY WITHOUT CONTRAST TECHNIQUE: Multidetector CT imaging of the upper right extremity was performed according to the standard protocol. COMPARISON:  Right shoulder x-rays from same day. FINDINGS: Bones/Joint/Cartilage Anterior dislocation of the humeral head with respect to the glenoid. Impaction fracture of the posterosuperior humeral head (series 6, image 46). The glenoid appears intact. Moderate acromioclavicular osteoarthritis. No joint effusion. Large amount of fluid in the subacromial/subdeltoid bursa. Ligaments Ligaments are suboptimally evaluated by CT. Muscles and Tendons Grossly intact.  No muscle atrophy. Soft tissue No fluid collection or hematoma.  No soft tissue mass. IMPRESSION: 1. Anterior shoulder dislocation with impaction fracture of the posterosuperior humeral head. 2. Severe subacromial/subdeltoid bursitis. Electronically Signed   By: Titus Dubin M.D.   On: 11/10/2020 20:18   DG Humerus Right  Result Date: 11/11/2020 CLINICAL DATA:  surgery EXAM: DG C-ARM 1-60 MIN; RIGHT HUMERUS - 2+ VIEW FLUOROSCOPY TIME:  Fluoroscopy Time:  33 seconds. Radiation Exposure Index (if provided by the fluoroscopic device): 5.65 mGy. Number of Acquired Spot Images: 2 COMPARISON:  11/10/2020. FINDINGS: Two C-arm fluoroscopic images were obtained intraoperatively and submitted for post operative interpretation. Alignment appears improved when compared to 11/10/2020  radiographs; however, limited assessment of alignment due to single provided projection. Please see the performing provider's procedural report for further detail. IMPRESSION: 1. Intraoperative fluoroscopy during close reduction of right shoulder dislocation. Alignment appears improved when compared to 11/10/2020 radiographs; however, limited assessment of alignment due to single provided projection. Dedicated shoulder radiographs could confirm reduction if clinically indicated. 2. Please see recent CT of the shoulder for characterization of impaction fracture. Electronically Signed   By: Margaretha Sheffield M.D.   On: 11/11/2020 14:46   DG C-Arm 1-60 Min  Result Date: 11/11/2020 CLINICAL DATA:  surgery EXAM: DG C-ARM 1-60 MIN; RIGHT HUMERUS - 2+ VIEW FLUOROSCOPY TIME:  Fluoroscopy Time:  33 seconds. Radiation Exposure Index (if provided by the fluoroscopic device): 5.65 mGy. Number of Acquired Spot Images: 2 COMPARISON:  11/10/2020. FINDINGS: Two C-arm fluoroscopic images were obtained intraoperatively and submitted for post operative interpretation. Alignment appears improved when compared to 11/10/2020 radiographs; however, limited assessment of alignment due to single provided projection. Please see the performing provider's procedural report for further detail. IMPRESSION: 1. Intraoperative fluoroscopy during close reduction of right shoulder dislocation. Alignment appears improved when compared to 11/10/2020 radiographs; however, limited assessment of alignment due  to single provided projection. Dedicated shoulder radiographs could confirm reduction if clinically indicated. 2. Please see recent CT of the shoulder for characterization of impaction fracture. Electronically Signed   By: Margaretha Sheffield M.D.   On: 11/11/2020 14:46    Assessment/Plan 1. Atherosclerosis of native coronary artery of native heart with angina pectoris [facility]) Patient underwent cardiac cath in May of this year which showed  nonobstructive CAD.  Continue with metoprolol atorvastatin and nitroglycerin  2. Chronic diastolic heart failure (HCC) Continue with diuretic therapy to include torsemide, spironolactone and metoprolol    4. Long term current use of anticoagulant therapy On Eliquis with atrial fibrillation.  Eliquis was held while in the hospital and replaced with Lovenox  5. Morbid obesity with BMI of 50.0-59.9, adult (HCC) BMI consistent with morbid obesity but has multiple other risk factors including coronary disease heart failure sleep apnea  6. Paroxysmal A-fib (HCC) Rate controlled on amiodarone and metoprolol.  Continue Eliquis  7. Rheumatoid arthritis involving multiple sites, unspecified whether rheumatoid factor present [facility]) Patient is on [place].  We will continue     Family/ staff Communication: Spoke with husband and her room.  He is interesed in as much therapy as she can get

## 2020-11-24 NOTE — Chronic Care Management (AMB) (Signed)
  Care Management   Note  11/24/2020 Name: Tina Pacheco MRN: [id number] DOB: 1950/03/24  Tina Pacheco is a 70 y.o. year old female who is a primary care patient of Venia Carbon, MD and is actively engaged with the care management team. I reached out to [name] by phone today to assist with re-scheduling a follow up visit with the RN Case Manager  Follow up plan: [name] is currently at rehabilitation center and will not be home until middle or end of October, will f/u at that time   Julian Hy, [place], [place]: [phone]

## 2020-12-03 ENCOUNTER — Non-Acute Institutional Stay (SKILLED_NURSING_FACILITY): Payer: Medicare Other | Admitting: Adult Health

## 2020-12-03 ENCOUNTER — Encounter: Payer: Self-pay | Admitting: Adult Health

## 2020-12-03 DIAGNOSIS — M069 Rheumatoid arthritis, unspecified: Secondary | ICD-10-CM

## 2020-12-03 DIAGNOSIS — S43004S Unspecified dislocation of right shoulder joint, sequela: Secondary | ICD-10-CM

## 2020-12-03 DIAGNOSIS — I480 Paroxysmal atrial fibrillation: Secondary | ICD-10-CM | POA: Diagnosis not present

## 2020-12-03 DIAGNOSIS — I5032 Chronic diastolic (congestive) heart failure: Secondary | ICD-10-CM | POA: Diagnosis not present

## 2020-12-03 NOTE — Progress Notes (Signed)
Location:  [facility] Room Number: 124-A Place of Service:  SNF (31) Provider:  Durenda Age, DNP, FNP-BC  Patient Care Team: Venia Carbon, MD as PCP - General (Internal Medicine) Minna Merritts, MD as PCP - Cardiology (Cardiology) Tanda Rockers, MD as Consulting Physician (Pulmonary Disease) Minna Merritts, MD as Consulting Physician (Cardiology) Dannielle Karvonen, RN as Case Manager  Extended Emergency Contact Information Primary Emergency Contact: Finis Bud Address: [address]          [place], [place] [address] Johnnette Litter of [place] Phone: [phone] Mobile Phone: [phone] Relation: Spouse Secondary Emergency Contact: Neely,Christina  [place] Phone: [phone] Mobile Phone: [phone] Relation: Daughter  Code Status: Full code   Goals of care: Advanced Directive information Advanced Directives 12/03/2020  Does Patient Have a Medical Advance Directive? No  Type of Advance Directive -  Does patient want to make changes to medical advance directive? No - Patient declined  Would patient like information on creating a medical advance directive? -  Pre-existing out of facility DNR order (yellow form or pink MOST form) -     Chief Complaint  Patient presents with   Acute Visit    Short-term care visit    HPI:  Pt is a 70 y.o. female seen today for medical management of chronic diseases.  She has a PMH of rheumatoid arthritis on Arava, CAD, paroxysmal atrial fibrillation on Eliquis, chronic diastolic heart failure, hypertension, chronic kidney disease stage IIIa, COPD, sleep apnea and morbid obesity. No reported SOB. She takes metolazone 2.5 mg daily PRN, torsemide 40 mg daily PRN and spironolactone 25 mg daily for chronic diastolic heart failure. HRs less than 100/min.  She takes metoprolol succinate ER 50 mg daily, amiodarone 200 mg at bedtime for paroxysmal atrial fibrillation. Right  shoulder surgical incision staples were removed already.  Past Medical History:  Diagnosis Date   Arthritis    RA   Asthma    Basal cell carcinoma    Cataract 2018   bilateral eyes; corrected with surgery   Claustrophobia    Collagen vascular disease ([place])    RA   COPD (chronic obstructive pulmonary disease) (HCC)    Diastolic dysfunction    a. echo 07/2014: EF 55-60%, no RWMA, GR2DD, mild MR, LA moderately dilated, PASP 38 mm Hg   Dyslipidemia    Headache    migraines   Hemihypertrophy    History of cardiac cath    a. cardiac cath 05/24/2010 - nonobstructive CAD   History of gout    Hyperplastic colonic polyp 2003   Hypertension    Hypokalemia    Morbid obesity ([place])    PAF (paroxysmal atrial fibrillation) ([place])    a. on Pradaxa; b. CHADSVASc at least 2 (HTN & female)   Rheumatoid arthritis(714.0)    Sleep apnea    a. not compliant with CPAP   Past Surgical History:  Procedure Laterality Date   ABDOMINAL HYSTERECTOMY     APPLICATION OF WOUND VAC Right 08/09/2017   Procedure: APPLICATION OF WOUND VAC;  Surgeon: Albertine Patricia, DPM;  Location: [facility];  Service: Podiatry;  Laterality: Right;   CARDIAC CATHETERIZATION  05/24/2010   nonobstructive CAD   CATARACT EXTRACTION W/ INTRAOCULAR LENS IMPLANT Right 06/12/2016   Dr. Darleen Crocker   CATARACT EXTRACTION W/ INTRAOCULAR LENS IMPLANT Left 06/26/2016   Dr. Darleen Crocker   CESAREAN SECTION     CHOLECYSTECTOMY     COLONOSCOPY  06/12/2011   Procedure: COLONOSCOPY;  Surgeon: Juanita Craver, MD;  Location: [facility];  Service: Endoscopy;  Laterality: N/A;   COLONOSCOPY N/A 03/17/2013   Procedure: COLONOSCOPY;  Surgeon: Juanita Craver, MD;  Location: [facility];  Service: Endoscopy;  Laterality: N/A;   EYE SURGERY     FOOT ARTHRODESIS Right 07/13/2017   Procedure: ARTHRODESIS FOOT-MULTI.FUSIONS (6 JOINTS);  Surgeon: Albertine Patricia, DPM;  Location: [facility];  Service: Podiatry;  Laterality: Right;   IRRIGATION AND DEBRIDEMENT  FOOT Right 08/09/2017   Procedure: IRRIGATION AND DEBRIDEMENT FOOT;  Surgeon: Albertine Patricia, DPM;  Location: [facility];  Service: Podiatry;  Laterality: Right;   KNEE ARTHROSCOPY     bilateral   LEFT HEART CATH AND CORONARY ANGIOGRAPHY N/A 06/04/2020   Procedure: LEFT HEART CATH AND CORONARY ANGIOGRAPHY;  Surgeon: Wellington Hampshire, MD;  Location: [facility];  Service: Cardiovascular;  Laterality: N/A;   REVERSE SHOULDER ARTHROPLASTY Right 11/16/2020   Procedure: REVERSE SHOULDER ARTHROPLASTY;  Surgeon: Corky Mull, MD;  Location: [facility];  Service: Orthopedics;  Laterality: Right;   SHOULDER CLOSED REDUCTION Right 11/11/2020   Procedure: CLOSED REDUCTION SHOULDER;  Surgeon: Corky Mull, MD;  Location: [facility];  Service: Orthopedics;  Laterality: Right;   SHOULDER INJECTION Right 11/11/2020   Procedure: SHOULDER INJECTION;  Surgeon: Corky Mull, MD;  Location: [facility];  Service: Orthopedics;  Laterality: Right;   SINUSOTOMY     TOOTH EXTRACTION  12/2016   VAGINAL HYSTERECTOMY      Allergies  Allergen Reactions   Fluoxetine Other (See Comments)    Headache, shaking, sleep issues Headache, shaking, sleep issues   Codeine     Nausea and vomiting/only when taking too much    Outpatient Encounter Medications as of 12/03/2020  Medication Sig   Amino Acids-Protein Hydrolys (PRO-STAT PO) Take 30 mLs by mouth.   amiodarone (PACERONE) 200 MG tablet Take 1 tablet (200 mg total) by mouth daily. Take one tab daily. (Patient taking differently: Take 200 mg by mouth daily. GIVE ONE TABLET BY AT BEDTIME PER NP AND RESIDENT REQUEST)   apixaban (ELIQUIS) 5 MG TABS tablet TAKE 1 TABLET(5 MG) BY MOUTH TWICE DAILY   atorvastatin (LIPITOR) 40 MG tablet Take 40 mg by mouth daily.   bisacodyl (DULCOLAX) 10 MG suppository Place 10 mg rectally as needed for moderate constipation.   gabapentin (NEURONTIN) 300 MG capsule Take 1 capsule (300 mg total) by mouth 4 (four) times daily.    HYDROcodone-acetaminophen (NORCO/VICODIN) 5-325 MG tablet Take 1 tablet by mouth every 6 (six) hours as needed for moderate pain.   ketoconazole (NIZORAL) 2 % cream APPLY A SMALL AMOUNT TO AFFECTED AREA ONCE A DAY TO RASH ON BUTTOCKS, GROIN   leflunomide (ARAVA) 20 MG tablet Take 20 mg by mouth daily.   Magnesium Hydroxide (MILK OF MAGNESIA PO) Take by mouth as needed.   meclizine (ANTIVERT) 25 MG tablet TAKE 1 TABLET(25 MG) BY MOUTH THREE TIMES DAILY AS NEEDED FOR DIZZINESS   metolazone (ZAROXOLYN) 2.5 MG tablet Take 2.5 mg by mouth daily as needed. Give 1 tablet PRN, daily by mouth for weight gain of 5lbs in a week.   metoprolol succinate (TOPROL-XL) 50 MG 24 hr tablet TAKE 1 TABLET BY MOUTH EVERY DAY WITH OR IMMEDIATELY FOLLOWING A MEAL   montelukast (SINGULAIR) 10 MG tablet TAKE 1 TABLET BY MOUTH AT BEDTIME   nitroGLYCERIN (NITROSTAT) 0.4 MG SL tablet Place 1 tablet (0.4 mg total) under the tongue every 5 (five)  minutes as needed for chest pain.   NON FORMULARY Diet:Heart healthy   nystatin (MYCOSTATIN/NYSTOP) powder Apply 1 application topically 3 (three) times daily as needed.   ondansetron (ZOFRAN) 4 MG tablet Take 1 tablet (4 mg total) by mouth every 6 (six) hours as needed for nausea.   Oxycodone HCl 20 MG TABS Take 1 tablet (20 mg total) by mouth in the morning and at bedtime.   polyethylene glycol (MIRALAX / GLYCOLAX) packet Take 17 g by mouth daily as needed for mild constipation.   Potassium Chloride ER 20 MEQ TBCR Take 20 mEq by mouth daily at 12 noon.   SF 5000 PLUS 1.1 % CREA dental cream Place 1 application onto teeth 2 (two) times daily.   Sodium Phosphates (RA SALINE ENEMA RE) Place rectally as needed.   spironolactone (ALDACTONE) 25 MG tablet TAKE 1 TABLET(25 MG) BY MOUTH DAILY   terazosin (HYTRIN) 10 MG capsule TAKE 1 CAPSULE(10 MG) BY MOUTH AT BEDTIME   torsemide (DEMADEX) 20 MG tablet Take 40 mg by mouth daily. Give two tablets (40 mg) by mouth daily for CHF    [DISCONTINUED] HYDROcodone-acetaminophen (NORCO/VICODIN) 5-325 MG tablet Take 1-2 tablets by mouth every 4 (four) hours as needed for moderate pain. (Patient taking differently: Take 1 tablet by mouth every 6 (six) hours as needed for moderate pain.)   No facility-administered encounter medications on file as of 12/03/2020.    Review of Systems  GENERAL: No change in appetite, no fatigue, no weight changes, no fever, chills or weakness MOUTH and THROAT: Denies oral discomfort, gingival pain or bleeding RESPIRATORY: no cough, SOB, DOE, wheezing, hemoptysis CARDIAC: No chest pain, edema or palpitations GI: No abdominal pain, diarrhea, constipation, heart burn, nausea or vomiting GU: Denies dysuria, frequency, hematuria, incontinence, or discharge NEUROLOGICAL: Denies dizziness, syncope, numbness, or headache PSYCHIATRIC: Denies feelings of depression or anxiety. No report of hallucinations, insomnia, paranoia, or agitation   Immunization History  Administered Date(s) Administered   Fluad Quad(high Dose 65+) 11/28/2018, 12/19/2019, 11/13/2020   Influenza Split 11/28/2014   Influenza,inj,Quad PF,6+ Mos 11/20/2012, 12/14/2015, 11/16/2016, 11/26/2017   Influenza-Unspecified 12/11/2013   Moderna Sars-Covid-2 Vaccination 04/07/2019, 05/05/2019, 08/06/2020   Pneumococcal Conjugate-13 06/15/2015   Pneumococcal Polysaccharide-23 01/05/2014   Zoster, Live 05/09/2011   Pertinent  Health Maintenance Due  Topic Date Due   MAMMOGRAM  03/11/2021   COLONOSCOPY (Pts 45-77yrs Insurance coverage will need to be confirmed)  03/18/2023   INFLUENZA VACCINE  Completed   DEXA SCAN  Completed   Fall Risk  07/06/2020 03/10/2020 03/10/2020 01/19/2020 01/08/2020  Falls in the past year? 1 1 1 1 1   Comment - - - - -  Number falls in past yr: 1 1 1 1 1   Injury with Fall? 0 0 0 0 0  Risk Factor Category  - - - - -  Risk for fall due to : History of fall(s) Impaired mobility;Impaired balance/gait;History of  fall(s) Impaired mobility;Impaired balance/gait;History of fall(s) History of fall(s);Impaired balance/gait Impaired mobility;Impaired balance/gait;History of fall(s)  Risk for fall due to: Comment - - - - -  Follow up Falls prevention discussed Falls prevention discussed;Education provided;Falls evaluation completed Falls prevention discussed;Education provided;Falls evaluation completed Falls evaluation completed Falls prevention discussed;Education provided;Falls evaluation completed     Vitals:   12/03/20 1120  BP: 138/64  Pulse: (!) 57  Resp: 18  Temp: (!) 97.3 F (36.3 C)  Weight: 292 lb (132.5 kg)  Height: 5\' 3"  (1.6 m)   Body mass index is 51.73  kg/m.  Physical Exam  GENERAL APPEARANCE: Well nourished. In no acute distress.  Morbidly obese  SKIN: Right shoulder surgical incision is dry, no erythema MOUTH and THROAT: Lips are without lesions. Oral mucosa is moist and without lesions.  RESPIRATORY: Breathing is even & unlabored, BS CTAB CARDIAC: RRR, no murmur,no extra heart sounds, no edema GI: Abdomen soft, normal BS, no masses, no tenderness NEUROLOGICAL: There is no tremor. Speech is clear. Alert and oriented X 3. PSYCHIATRIC:  Affect and behavior are appropriate  Labs reviewed: Recent Labs    02/03/20 1042 05/28/20 2031 05/29/20 0243 06/02/20 0401 06/23/20 1236 10/12/20 1157 11/10/20 2012 11/14/20 0613 11/17/20 0328 11/18/20 0458 11/19/20 0000 11/23/20 0000  NA 138 135   < > 134*   < > 138   < > 137 136 133* 137 139  K 3.8 3.4*   < > 3.8   < > 3.8   < > 3.7 4.6 3.7 3.2* 4.1  CL 98 94*   < > 92*   < > 99   < > 99 96* 96* 97* 97*  CO2 28 27   < > 31   < > 31   < > 31 31 29  31* 28*  GLUCOSE 99 127*   < > 100*   < > 87   < > 117* 119* 123*  --   --   BUN 26* 22   < > 30*   < > 19   < > 37* 33* 35* 26* 31*  CREATININE 1.05 1.10*   < > 1.10*   < > 1.17   < > 1.24* 1.46* 1.24* 1.1 1.4*  CALCIUM 11.2* 10.1   < > 10.1   < > 10.7*   < > 9.7 9.8 9.9 10.1 11.0*  MG   --  1.7  --  1.9  --   --   --   --   --   --   --   --   PHOS 3.1  --   --   --   --  2.5  --   --   --   --   --   --    < > = values in this interval not displayed.   Recent Labs    06/01/20 0402 06/02/20 0401 06/29/20 1028 10/12/20 1157  AST 25 24 27   --   ALT 17 18 17   --   ALKPHOS 93 83 107  --   BILITOT 1.2 1.4* 1.1  --   PROT 6.7 6.3* 6.3  --   ALBUMIN 3.6 3.4* 4.0 3.9   Recent Labs    05/28/20 2031 05/31/20 0342 11/10/20 2012 11/13/20 1258 11/17/20 0328 11/18/20 0458 11/19/20 0000  WBC 9.6   < > 7.0 7.3 11.0* 8.4 6.8  NEUTROABS 5.4  --  4.2 5.1  --   --   --   HGB 13.6   < > 12.7 12.5 11.5* 10.1* 11.3*  HCT 42.7   < > 39.4 38.4 35.2* 30.8* 34*  MCV 90.9   < > 94.3 93.2 92.6 93.6  --   PLT 253   < > 165 186 163 144* 160   < > = values in this interval not displayed.   Lab Results  Component Value Date   TSH 4.149 12/28/2017   Lab Results  Component Value Date   HGBA1C (H) 05/24/2010    6.3 (NOTE)  According to the ADA Clinical Practice Recommendations for 2011, when HbA1c is used as a screening test:   >=6.5%   Diagnostic of Diabetes Mellitus           (if abnormal result  is confirmed)  5.7-6.4%   Increased risk of developing Diabetes Mellitus  References:Diagnosis and Classification of Diabetes Mellitus,Diabetes IZTI,4580,99(IPJAS 1):S62-S69 and Standards of Medical Care in         Diabetes - 2011,Diabetes NKNL,9767,34  (Suppl 1):S11-S61.   Lab Results  Component Value Date   CHOL 113 08/27/2019   HDL 43.70 08/27/2019   LDLCALC 56 08/27/2019   TRIG 70.0 08/27/2019   CHOLHDL 3 08/27/2019    Significant Diagnostic Results in last 30 days:  DG Chest Port 1 View  Result Date: 11/13/2020 CLINICAL DATA:  Cough. EXAM: PORTABLE CHEST 1 VIEW COMPARISON:  May 28, 2020 FINDINGS: Mild opacity in the left mid lung suspected. The heart, hila, mediastinum, and pleura are normal. No other acute  abnormalities. IMPRESSION: Suspected mild opacity in left mid lung may represent subtle pneumonia. Recommend short-term follow-up imaging after treatment. Electronically Signed   By: Dorise Bullion III M.D.   On: 11/13/2020 11:31   DG Shoulder Right Port  Result Date: 11/16/2020 CLINICAL DATA:  Status post reverse arthroplasty. EXAM: PORTABLE RIGHT SHOULDER COMPARISON:  Radiograph 11/10/2020 FINDINGS: Reverse right shoulder arthroplasty in expected alignment. No periprosthetic lucency or fracture. Recent postsurgical change includes soft tissue edema and skin staples in place. IMPRESSION: Reverse right shoulder arthroplasty without immediate postoperative complication. Electronically Signed   By: Keith Rake M.D.   On: 11/16/2020 17:34    Assessment/Plan  1. Chronic diastolic heart failure (HCC) -   no SOB, continue torsemide, torsemide  PRN, metolazone PRN and spironolactone  2. Paroxysmal A-fib (HCC) -  rate-controlled, continue amiodarone and metoprolol succinate ER  3. Closed dislocation of right shoulder, sequela -   S/P reduction of right shoulder dislocation on 11/11/2020 -   S/P reverse right total shoulder arthroplasty on 11/16/2020 -   Continue PT and OT, for therapeutic strengthening exercises -    Continue Norco PRN, oxycodone ER and Neurontin for pain -    staples were removed  4. Rheumatoid arthritis involving multiple sites, unspecified whether rheumatoid factor present ([place]) -  stable, continue Pharmacist, hospital Communication: Discussed plan of care with resident, husband (who is at bedside) and charge nurse.  Labs/tests ordered:   None  Goals of care:   Short-term care   Durenda Age, DNP, MSN, FNP-BC [facility] [phone] (Monday-Friday 8:00 a.m. - 5:00 p.m.) [phone] (after hours)

## 2020-12-14 ENCOUNTER — Encounter (HOSPITAL_COMMUNITY): Payer: Self-pay | Admitting: Radiology

## 2020-12-15 ENCOUNTER — Other Ambulatory Visit: Payer: Self-pay | Admitting: Adult Health

## 2020-12-15 MED ORDER — OXYCODONE HCL 20 MG PO TABS: 1.0000 | ORAL_TABLET | Freq: Two times a day (BID) | ORAL | 0 refills | Status: DC

## 2020-12-16 ENCOUNTER — Non-Acute Institutional Stay (SKILLED_NURSING_FACILITY): Payer: Medicare Other | Admitting: Adult Health

## 2020-12-16 ENCOUNTER — Encounter: Payer: Self-pay | Admitting: Adult Health

## 2020-12-16 DIAGNOSIS — I5032 Chronic diastolic (congestive) heart failure: Secondary | ICD-10-CM

## 2020-12-16 DIAGNOSIS — S43004S Unspecified dislocation of right shoulder joint, sequela: Secondary | ICD-10-CM

## 2020-12-16 DIAGNOSIS — G894 Chronic pain syndrome: Secondary | ICD-10-CM | POA: Diagnosis not present

## 2020-12-16 DIAGNOSIS — I480 Paroxysmal atrial fibrillation: Secondary | ICD-10-CM | POA: Diagnosis not present

## 2020-12-16 DIAGNOSIS — M069 Rheumatoid arthritis, unspecified: Secondary | ICD-10-CM | POA: Diagnosis not present

## 2020-12-16 DIAGNOSIS — E782 Mixed hyperlipidemia: Secondary | ICD-10-CM | POA: Diagnosis not present

## 2020-12-16 DIAGNOSIS — J189 Pneumonia, unspecified organism: Secondary | ICD-10-CM

## 2020-12-16 DIAGNOSIS — I10 Essential (primary) hypertension: Secondary | ICD-10-CM | POA: Diagnosis not present

## 2020-12-16 DIAGNOSIS — B372 Candidiasis of skin and nail: Secondary | ICD-10-CM

## 2020-12-16 DIAGNOSIS — G2581 Restless legs syndrome: Secondary | ICD-10-CM

## 2020-12-16 NOTE — Progress Notes (Signed)
Location:  [place] Room Number: 124 A Place of Service:  SNF (31) Provider:  Durenda Age, DNP, FNP-BC  Patient Care Team: Venia Carbon, MD as PCP - General (Internal Medicine) Minna Merritts, MD as PCP - Cardiology (Cardiology) Tanda Rockers, MD as Consulting Physician (Pulmonary Disease) Minna Merritts, MD as Consulting Physician (Cardiology) Dannielle Karvonen, RN as Case Manager  Extended Emergency Contact Information Primary Emergency Contact: Finis Bud Address: [address]          [place], [place] [address] Johnnette Litter of [place] Phone: [phone] Mobile Phone: [phone] Relation: Spouse Secondary Emergency Contact: Neely,Christina  [place] Phone: [phone] Mobile Phone: [phone] Relation: Daughter  Code Status:   Full Code  Goals of care: Advanced Directive information Advanced Directives 12/03/2020  Does Patient Have a Medical Advance Directive? No  Type of Advance Directive -  Does patient want to make changes to medical advance directive? No - Patient declined  Would patient like information on creating a medical advance directive? -  Pre-existing out of facility DNR order (yellow form or pink MOST form) -     Chief Complaint  Patient presents with   Discharge Note    For discharge home with Home health PT, OT and Nursing    HPI:  Pt is a 70 y.o. female who is for discharge home on 12/18/2020 with home health PT OT, OT and Nursing.  She was admitted to [facility] on 11/19/2016 post hospital 11/10/2020 to 11/18/20.  She has a medical history significant for rheumatoid arthritis on Arava, CAD, atrial fibrillation on Eliquis, chronic diastolic heart failure, hypertension with chronic kidney disease stage IIIa, COPD, sleep apnea and morbid obesity with BMI of 50.  She had reduction of right shoulder dislocation on 11/11/2020.  However, she continued to  have right shoulder pain so she was taken back to the operating room on 11/16/2020 for reverse right total shoulder arthroplasty.  Hospitalization was complicated by persistent cough.  Chest x-ray was concerning for suspected left midlung pneumonia.  She was given Augmentin X5 days.  Patient was admitted to this facility for short-term rehabilitation after the patient's recent hospitalization.  Patient has completed SNF rehabilitation and therapy has cleared the patient for discharge.   Past Medical History:  Diagnosis Date   Arthritis    RA   Asthma    Basal cell carcinoma    Cataract 2018   bilateral eyes; corrected with surgery   Claustrophobia    Collagen vascular disease ([place])    RA   COPD (chronic obstructive pulmonary disease) (HCC)    Diastolic dysfunction    a. echo 07/2014: EF 55-60%, no RWMA, GR2DD, mild MR, LA moderately dilated, PASP 38 mm Hg   Dyslipidemia    Headache    migraines   Hemihypertrophy    History of cardiac cath    a. cardiac cath 05/24/2010 - nonobstructive CAD   History of gout    Hyperplastic colonic polyp 2003   Hypertension    Hypokalemia    Morbid obesity ([place])    PAF (paroxysmal atrial fibrillation) ([place])    a. on Pradaxa; b. CHADSVASc at least 2 (HTN & female)   Rheumatoid arthritis(714.0)    Sleep apnea    a. not compliant with CPAP   Past Surgical History:  Procedure Laterality Date   ABDOMINAL HYSTERECTOMY     APPLICATION OF WOUND VAC Right 08/09/2017   Procedure: APPLICATION  OF WOUND VAC;  Surgeon: Albertine Patricia, DPM;  Location: [facility];  Service: Podiatry;  Laterality: Right;   CARDIAC CATHETERIZATION  05/24/2010   nonobstructive CAD   CATARACT EXTRACTION W/ INTRAOCULAR LENS IMPLANT Right 06/12/2016   Dr. Darleen Crocker   CATARACT EXTRACTION W/ INTRAOCULAR LENS IMPLANT Left 06/26/2016   Dr. Darleen Crocker   CESAREAN SECTION     CHOLECYSTECTOMY     COLONOSCOPY  06/12/2011   Procedure: COLONOSCOPY;  Surgeon: Juanita Craver, MD;   Location: [facility];  Service: Endoscopy;  Laterality: N/A;   COLONOSCOPY N/A 03/17/2013   Procedure: COLONOSCOPY;  Surgeon: Juanita Craver, MD;  Location: [facility];  Service: Endoscopy;  Laterality: N/A;   EYE SURGERY     FOOT ARTHRODESIS Right 07/13/2017   Procedure: ARTHRODESIS FOOT-MULTI.FUSIONS (6 JOINTS);  Surgeon: Albertine Patricia, DPM;  Location: [facility];  Service: Podiatry;  Laterality: Right;   IRRIGATION AND DEBRIDEMENT FOOT Right 08/09/2017   Procedure: IRRIGATION AND DEBRIDEMENT FOOT;  Surgeon: Albertine Patricia, DPM;  Location: [facility];  Service: Podiatry;  Laterality: Right;   KNEE ARTHROSCOPY     bilateral   LEFT HEART CATH AND CORONARY ANGIOGRAPHY N/A 06/04/2020   Procedure: LEFT HEART CATH AND CORONARY ANGIOGRAPHY;  Surgeon: Wellington Hampshire, MD;  Location: [place] [facility];  Service: Cardiovascular;  Laterality: N/A;   REVERSE SHOULDER ARTHROPLASTY Right 11/16/2020   Procedure: REVERSE SHOULDER ARTHROPLASTY;  Surgeon: Corky Mull, MD;  Location: [facility];  Service: Orthopedics;  Laterality: Right;   SHOULDER CLOSED REDUCTION Right 11/11/2020   Procedure: CLOSED REDUCTION SHOULDER;  Surgeon: Corky Mull, MD;  Location: [facility];  Service: Orthopedics;  Laterality: Right;   SHOULDER INJECTION Right 11/11/2020   Procedure: SHOULDER INJECTION;  Surgeon: Corky Mull, MD;  Location: [facility];  Service: Orthopedics;  Laterality: Right;   SINUSOTOMY     TOOTH EXTRACTION  12/2016   VAGINAL HYSTERECTOMY      Allergies  Allergen Reactions   Fluoxetine Other (See Comments)    Headache, shaking, sleep issues Headache, shaking, sleep issues   Codeine     Nausea and vomiting/only when taking too much    Outpatient Encounter Medications as of 12/16/2020  Medication Sig   Amino Acids-Protein Hydrolys (PRO-STAT PO) Take 30 mLs by mouth.   amiodarone (PACERONE) 200 MG tablet Take 1 tablet (200 mg total) by mouth daily. Take one tab daily. (Patient taking differently: Take  200 mg by mouth daily. GIVE ONE TABLET BY AT BEDTIME PER NP AND RESIDENT REQUEST)   apixaban (ELIQUIS) 5 MG TABS tablet TAKE 1 TABLET(5 MG) BY MOUTH TWICE DAILY   atorvastatin (LIPITOR) 40 MG tablet Take 40 mg by mouth daily.   bisacodyl (DULCOLAX) 10 MG suppository Place 10 mg rectally as needed for moderate constipation.   gabapentin (NEURONTIN) 300 MG capsule Take 1 capsule (300 mg total) by mouth 4 (four) times daily.   HYDROcodone-acetaminophen (NORCO/VICODIN) 5-325 MG tablet Take 1 tablet by mouth every 6 (six) hours as needed for moderate pain.   ketoconazole (NIZORAL) 2 % cream APPLY A SMALL AMOUNT TO AFFECTED AREA ONCE A DAY TO RASH ON BUTTOCKS, GROIN   leflunomide (ARAVA) 20 MG tablet Take 20 mg by mouth daily.   Magnesium Hydroxide (MILK OF MAGNESIA PO) Take by mouth as needed.   meclizine (ANTIVERT) 25 MG tablet TAKE 1 TABLET(25 MG) BY MOUTH THREE TIMES DAILY AS NEEDED FOR DIZZINESS   metolazone (ZAROXOLYN) 2.5 MG tablet Take 2.5 mg by mouth daily as  needed. Give 1 tablet PRN, daily by mouth for weight gain of 5lbs in a week.   metoprolol succinate (TOPROL-XL) 50 MG 24 hr tablet TAKE 1 TABLET BY MOUTH EVERY DAY WITH OR IMMEDIATELY FOLLOWING A MEAL   montelukast (SINGULAIR) 10 MG tablet TAKE 1 TABLET BY MOUTH AT BEDTIME   nitroGLYCERIN (NITROSTAT) 0.4 MG SL tablet Place 1 tablet (0.4 mg total) under the tongue every 5 (five) minutes as needed for chest pain.   NON FORMULARY Diet:Heart healthy   nystatin (MYCOSTATIN/NYSTOP) powder Apply 1 application topically 3 (three) times daily as needed.   ondansetron (ZOFRAN) 4 MG tablet Take 1 tablet (4 mg total) by mouth every 6 (six) hours as needed for nausea.   Oxycodone HCl 20 MG TABS Take 1 tablet (20 mg total) by mouth in the morning and at bedtime.   polyethylene glycol (MIRALAX / GLYCOLAX) packet Take 17 g by mouth daily as needed for mild constipation.   Potassium Chloride ER 20 MEQ TBCR Take 20 mEq by mouth daily at 12 noon.   SF 5000  PLUS 1.1 % CREA dental cream Place 1 application onto teeth 2 (two) times daily.   Sodium Phosphates (RA SALINE ENEMA RE) Place rectally as needed.   spironolactone (ALDACTONE) 25 MG tablet TAKE 1 TABLET(25 MG) BY MOUTH DAILY   terazosin (HYTRIN) 10 MG capsule TAKE 1 CAPSULE(10 MG) BY MOUTH AT BEDTIME   torsemide (DEMADEX) 20 MG tablet Take 40 mg by mouth daily. Give two tablets (40 mg) by mouth daily for CHF   No facility-administered encounter medications on file as of 12/16/2020.    Review of Systems  GENERAL: No change in appetite, no fatigue, no weight changes, no fever or chills  MOUTH and THROAT: Denies oral discomfort, gingival pain or bleeding RESPIRATORY: no cough, SOB, DOE, wheezing, hemoptysis CARDIAC: No chest pain, edema or palpitations GI: No abdominal pain, diarrhea, constipation, heart burn, nausea or vomiting GU: Denies dysuria, frequency, hematuria, incontinence, or discharge NEUROLOGICAL: Denies dizziness, syncope, numbness, or headache PSYCHIATRIC: Denies feelings of depression or anxiety. No report of hallucinations, insomnia, paranoia, or agitation   Immunization History  Administered Date(s) Administered   Fluad Quad(high Dose 65+) 11/28/2018, 12/19/2019, 11/13/2020   Influenza Split 11/28/2014   Influenza,inj,Quad PF,6+ Mos 11/20/2012, 12/14/2015, 11/16/2016, 11/26/2017   Influenza-Unspecified 12/11/2013   Moderna Sars-Covid-2 Vaccination 04/07/2019, 05/05/2019, 08/06/2020   Pneumococcal Conjugate-13 06/15/2015   Pneumococcal Polysaccharide-23 01/05/2014   Zoster, Live 05/09/2011   Pertinent  Health Maintenance Due  Topic Date Due   MAMMOGRAM  03/11/2021   COLONOSCOPY (Pts 45-57yrs Insurance coverage will need to be confirmed)  03/18/2023   INFLUENZA VACCINE  Completed   DEXA SCAN  Completed   Fall Risk  07/06/2020 03/10/2020 03/10/2020 01/19/2020 01/08/2020  Falls in the past year? 1 1 1 1 1   Comment - - - - -  Number falls in past yr: 1 1 1 1 1    Injury with Fall? 0 0 0 0 0  Risk Factor Category  - - - - -  Risk for fall due to : History of fall(s) Impaired mobility;Impaired balance/gait;History of fall(s) Impaired mobility;Impaired balance/gait;History of fall(s) History of fall(s);Impaired balance/gait Impaired mobility;Impaired balance/gait;History of fall(s)  Risk for fall due to: Comment - - - - -  Follow up Falls prevention discussed Falls prevention discussed;Education provided;Falls evaluation completed Falls prevention discussed;Education provided;Falls evaluation completed Falls evaluation completed Falls prevention discussed;Education provided;Falls evaluation completed     Vitals:   12/16/20 1000  BP:  138/68  Pulse: (!) 54  Resp: (!) 21  Temp: (!) 97.3 F (36.3 C)  Weight: 293 lb 8 oz (133.1 kg)  Height: 5\' 3"  (1.6 m)   Body mass index is 51.99 kg/m.  Physical Exam  GENERAL APPEARANCE: Well nourished. In no acute distress. Morbidly obese. SKIN:  right shoulder surgical wound is dry, left groin erythematous rashes MOUTH and THROAT: Lips are without lesions. Oral mucosa is moist and without lesions.   RESPIRATORY: Breathing is even & unlabored, BS CTAB CARDIAC: RRR, no murmur,no extra heart sounds, no edema GI: Abdomen soft, normal BS, no masses, no tenderness NEUROLOGICAL: There is no tremor. Speech is clear. Alert and oriented X 3. PSYCHIATRIC:  Affect and behavior are appropriate  Labs reviewed: Recent Labs    02/03/20 1042 05/28/20 2031 05/29/20 0243 06/02/20 0401 06/23/20 1236 10/12/20 1157 11/10/20 2012 11/14/20 0613 11/17/20 0328 11/18/20 0458 11/19/20 0000 11/23/20 0000  NA 138 135   < > 134*   < > 138   < > 137 136 133* 137 139  K 3.8 3.4*   < > 3.8   < > 3.8   < > 3.7 4.6 3.7 3.2* 4.1  CL 98 94*   < > 92*   < > 99   < > 99 96* 96* 97* 97*  CO2 28 27   < > 31   < > 31   < > 31 31 29  31* 28*  GLUCOSE 99 127*   < > 100*   < > 87   < > 117* 119* 123*  --   --   BUN 26* 22   < > 30*   < >  19   < > 37* 33* 35* 26* 31*  CREATININE 1.05 1.10*   < > 1.10*   < > 1.17   < > 1.24* 1.46* 1.24* 1.1 1.4*  CALCIUM 11.2* 10.1   < > 10.1   < > 10.7*   < > 9.7 9.8 9.9 10.1 11.0*  MG  --  1.7  --  1.9  --   --   --   --   --   --   --   --   PHOS 3.1  --   --   --   --  2.5  --   --   --   --   --   --    < > = values in this interval not displayed.   Recent Labs    06/01/20 0402 06/02/20 0401 06/29/20 1028 10/12/20 1157  AST 25 24 27   --   ALT 17 18 17   --   ALKPHOS 93 83 107  --   BILITOT 1.2 1.4* 1.1  --   PROT 6.7 6.3* 6.3  --   ALBUMIN 3.6 3.4* 4.0 3.9   Recent Labs    05/28/20 2031 05/31/20 0342 11/10/20 2012 11/13/20 1258 11/17/20 0328 11/18/20 0458 11/19/20 0000  WBC 9.6   < > 7.0 7.3 11.0* 8.4 6.8  NEUTROABS 5.4  --  4.2 5.1  --   --   --   HGB 13.6   < > 12.7 12.5 11.5* 10.1* 11.3*  HCT 42.7   < > 39.4 38.4 35.2* 30.8* 34*  MCV 90.9   < > 94.3 93.2 92.6 93.6  --   PLT 253   < > 165 186 163 144* 160   < > = values in this interval not displayed.  Lab Results  Component Value Date   TSH 4.149 12/28/2017   Lab Results  Component Value Date   HGBA1C (H) 05/24/2010    6.3 (NOTE)                                                                       According to the ADA Clinical Practice Recommendations for 2011, when HbA1c is used as a screening test:   >=6.5%   Diagnostic of Diabetes Mellitus           (if abnormal result  is confirmed)  5.7-6.4%   Increased risk of developing Diabetes Mellitus  References:Diagnosis and Classification of Diabetes Mellitus,Diabetes [id number],23(RAQTM 1):S62-S69 and Standards of Medical Care in         Diabetes - 2011,Diabetes [id number],56  (Suppl 1):S11-S61.   Lab Results  Component Value Date   CHOL 113 08/27/2019   HDL 43.70 08/27/2019   LDLCALC 56 08/27/2019   TRIG 70.0 08/27/2019   CHOLHDL 3 08/27/2019    Significant Diagnostic Results in last 30 days:  No results found.  Assessment/Plan  1. Closed dislocation  of right shoulder, sequela -   S/P reduction of right shoulder dislocation on 11/11/2020 -    S/P reverse right total shoulder arthroplasty on 11/16/2020 -   continue sling to right shoulder -   Follow-up with orthopedics  2. Pneumonia of left lung due to infectious organism, unspecified part of lung -  completed Augmentin X 5 days -  resolved  3. Rheumatoid arthritis involving multiple sites, unspecified whether rheumatoid factor present (HCC) - leflunomide (ARAVA) 20 MG tablet; Take 1 tablet (20 mg total) by mouth daily.  Dispense: 30 tablet; Refill: 0  4. Chronic diastolic heart failure (HCC) - metolazone (ZAROXOLYN) 2.5 MG tablet; Take 1 tablet (2.5 mg total) by mouth daily as needed. Give 1 tablet PRN, daily by mouth for weight gain of 5lbs in a week.  Dispense: 30 tablet; Refill: 0 - torsemide (DEMADEX) 20 MG tablet; Take 2 tablets (40 mg total) by mouth daily. Give two tablets (40 mg) by mouth daily for CHF  Dispense: 60 tablet; Refill: 0 - torsemide (DEMADEX) 20 MG tablet; Take 1 tablet (20 mg total) by mouth daily as needed. for shortness of breath  Dispense: 15 tablet; Refill: 0 - spironolactone (ALDACTONE) 25 MG tablet; TAKE 1 TABLET(25 MG) BY MOUTH DAILY  Dispense: 30 tablet; Refill: 0 - Potassium Chloride ER 20 MEQ TBCR; Take 20 mEq by mouth daily at 12 noon.  Dispense: 30 tablet; Refill: 0  5. Chronic pain syndrome - Oxycodone HCl 20 MG TABS; Take 1 tablet (20 mg total) by mouth in the morning and at bedtime.  Dispense: 60 tablet; Refill: 0  6. Paroxysmal A-fib (HCC) - metoprolol succinate (TOPROL-XL) 50 MG 24 hr tablet; TAKE 1 TABLET BY MOUTH EVERY DAY WITH OR IMMEDIATELY FOLLOWING A MEAL  Dispense: 30 tablet; Refill: 0 - amiodarone (PACERONE) 200 MG tablet; Take 1 tablet (200 mg total) by mouth at bedtime. GIVE ONE TABLET BY AT BEDTIME  Dispense: 30 tablet; Refill: 0 - apixaban (ELIQUIS) 5 MG TABS tablet; TAKE 1 TABLET(5 MG) BY MOUTH TWICE DAILY  Dispense: 60 tablet; Refill:  0  7. Candidal skin infection - nystatin (MYCOSTATIN/NYSTOP)  powder; Apply 1 application topically 3 (three) times daily as needed.  Dispense: 60 g; Refill: 0 - ketoconazole (NIZORAL) 2 % cream; APPLY A SMALL AMOUNT TO AFFECTED AREA ONCE A DAY TO RASH ON BUTTOCKS, GROIN  Dispense: 60 g; Refill: 1  8. Essential hypertension - terazosin (HYTRIN) 10 MG capsule; TAKE 1 CAPSULE(10 MG) BY MOUTH AT BEDTIME  Dispense: 30 capsule; Refill: 0  9. Mixed hyperlipidemia - atorvastatin (LIPITOR) 40 MG tablet; Take 1 tablet (40 mg total) by mouth daily.  Dispense: 30 tablet; Refill: 0  10. RLS (restless legs syndrome) - gabapentin (NEURONTIN) 300 MG capsule; Take 1 capsule (300 mg total) by mouth at bedtime.  Dispense: 30 capsule; Refill: 0    I have filled out patient's discharge paperwork and e-prescribed medications.  Patient will have home health PT and OT.  DME provided:   [facility]  Total discharge time: Greater than 30 minutes Greater than 50% was spent in counseling and coordination of care.   Discharge time involved coordination of the discharge process with social worker, nursing staff and therapy department. Medical justification for home health services/DME verified.   Durenda Age, DNP, MSN, FNP-BC [facility] [phone] (Monday-Friday 8:00 a.m. - 5:00 p.m.) [phone] (after hours)

## 2020-12-18 ENCOUNTER — Other Ambulatory Visit: Payer: Self-pay | Admitting: Adult Health

## 2020-12-18 MED ORDER — POTASSIUM CHLORIDE ER 20 MEQ PO TBCR: 20.0000 meq | EXTENDED_RELEASE_TABLET | Freq: Every day | ORAL | 0 refills | Status: DC

## 2020-12-18 MED ORDER — METOPROLOL SUCCINATE ER 50 MG PO TB24: ORAL_TABLET | ORAL | 0 refills | Status: DC

## 2020-12-18 MED ORDER — GABAPENTIN 300 MG PO CAPS: 300.0000 mg | ORAL_CAPSULE | Freq: Every day | ORAL | 0 refills | Status: DC

## 2020-12-18 MED ORDER — TERAZOSIN HCL 10 MG PO CAPS: ORAL_CAPSULE | ORAL | 0 refills | Status: DC

## 2020-12-18 MED ORDER — APIXABAN 5 MG PO TABS: ORAL_TABLET | ORAL | 0 refills | Status: DC

## 2020-12-18 MED ORDER — MONTELUKAST SODIUM 10 MG PO TABS: 10.0000 mg | ORAL_TABLET | Freq: Every day | ORAL | 0 refills | Status: DC

## 2020-12-18 MED ORDER — METOLAZONE 2.5 MG PO TABS: 2.5000 mg | ORAL_TABLET | Freq: Every day | ORAL | 0 refills | Status: DC | PRN

## 2020-12-18 MED ORDER — TORSEMIDE 20 MG PO TABS: 40.0000 mg | ORAL_TABLET | Freq: Every day | ORAL | 0 refills | Status: DC

## 2020-12-18 MED ORDER — OXYCODONE HCL 20 MG PO TABS: 1.0000 | ORAL_TABLET | Freq: Two times a day (BID) | ORAL | 0 refills | Status: DC

## 2020-12-18 MED ORDER — TORSEMIDE 20 MG PO TABS: 20.0000 mg | ORAL_TABLET | Freq: Every day | ORAL | 0 refills | Status: DC | PRN

## 2020-12-18 MED ORDER — KETOCONAZOLE 2 % EX CREA: TOPICAL_CREAM | CUTANEOUS | 1 refills | Status: DC

## 2020-12-18 MED ORDER — NYSTATIN 100000 UNIT/GM EX POWD: 1.0000 "application " | Freq: Three times a day (TID) | CUTANEOUS | 0 refills | Status: DC | PRN

## 2020-12-18 MED ORDER — LEFLUNOMIDE 20 MG PO TABS: 20.0000 mg | ORAL_TABLET | Freq: Every day | ORAL | 0 refills | Status: DC

## 2020-12-18 MED ORDER — AMIODARONE HCL 200 MG PO TABS: 200.0000 mg | ORAL_TABLET | Freq: Every day | ORAL | 0 refills | Status: DC

## 2020-12-18 MED ORDER — SPIRONOLACTONE 25 MG PO TABS: ORAL_TABLET | ORAL | 0 refills | Status: DC

## 2020-12-18 MED ORDER — ATORVASTATIN CALCIUM 40 MG PO TABS: 40.0000 mg | ORAL_TABLET | Freq: Every day | ORAL | 0 refills | Status: DC

## 2020-12-19 DIAGNOSIS — E785 Hyperlipidemia, unspecified: Secondary | ICD-10-CM | POA: Diagnosis not present

## 2020-12-19 DIAGNOSIS — I25119 Atherosclerotic heart disease of native coronary artery with unspecified angina pectoris: Secondary | ICD-10-CM | POA: Diagnosis not present

## 2020-12-19 DIAGNOSIS — E876 Hypokalemia: Secondary | ICD-10-CM | POA: Diagnosis not present

## 2020-12-19 DIAGNOSIS — G4730 Sleep apnea, unspecified: Secondary | ICD-10-CM | POA: Diagnosis not present

## 2020-12-19 DIAGNOSIS — J45909 Unspecified asthma, uncomplicated: Secondary | ICD-10-CM | POA: Diagnosis not present

## 2020-12-19 DIAGNOSIS — J440 Chronic obstructive pulmonary disease with acute lower respiratory infection: Secondary | ICD-10-CM | POA: Diagnosis not present

## 2020-12-19 DIAGNOSIS — M069 Rheumatoid arthritis, unspecified: Secondary | ICD-10-CM | POA: Diagnosis not present

## 2020-12-19 DIAGNOSIS — J189 Pneumonia, unspecified organism: Secondary | ICD-10-CM | POA: Diagnosis not present

## 2020-12-19 DIAGNOSIS — Z6841 Body Mass Index (BMI) 40.0 and over, adult: Secondary | ICD-10-CM | POA: Diagnosis not present

## 2020-12-19 DIAGNOSIS — I480 Paroxysmal atrial fibrillation: Secondary | ICD-10-CM | POA: Diagnosis not present

## 2020-12-19 DIAGNOSIS — G894 Chronic pain syndrome: Secondary | ICD-10-CM | POA: Diagnosis not present

## 2020-12-19 DIAGNOSIS — Z471 Aftercare following joint replacement surgery: Secondary | ICD-10-CM | POA: Diagnosis not present

## 2020-12-19 DIAGNOSIS — Z96611 Presence of right artificial shoulder joint: Secondary | ICD-10-CM | POA: Diagnosis not present

## 2020-12-19 DIAGNOSIS — N1831 Chronic kidney disease, stage 3a: Secondary | ICD-10-CM | POA: Diagnosis not present

## 2020-12-19 DIAGNOSIS — Z7952 Long term (current) use of systemic steroids: Secondary | ICD-10-CM | POA: Diagnosis not present

## 2020-12-19 DIAGNOSIS — Z79899 Other long term (current) drug therapy: Secondary | ICD-10-CM | POA: Diagnosis not present

## 2020-12-19 DIAGNOSIS — I5032 Chronic diastolic (congestive) heart failure: Secondary | ICD-10-CM | POA: Diagnosis not present

## 2020-12-19 DIAGNOSIS — G43909 Migraine, unspecified, not intractable, without status migrainosus: Secondary | ICD-10-CM | POA: Diagnosis not present

## 2020-12-19 DIAGNOSIS — I130 Hypertensive heart and chronic kidney disease with heart failure and stage 1 through stage 4 chronic kidney disease, or unspecified chronic kidney disease: Secondary | ICD-10-CM | POA: Diagnosis not present

## 2020-12-19 DIAGNOSIS — Z7901 Long term (current) use of anticoagulants: Secondary | ICD-10-CM | POA: Diagnosis not present

## 2020-12-20 ENCOUNTER — Telehealth: Payer: Self-pay | Admitting: *Deleted

## 2020-12-20 NOTE — Progress Notes (Signed)
Opened in error

## 2020-12-20 NOTE — Chronic Care Management (AMB) (Signed)
  Care Management   Note  12/20/2020 Name: Tina Pacheco MRN: [id number] DOB: [birth year]  Tina Pacheco is a 70 y.o. year old female who is a primary care patient of Venia Carbon, MD and is actively engaged with the care management team. I reached out to [name] by phone today to assist with re-scheduling a follow up visit with the RN Case Manager  Follow up plan: Telephone appointment with care management team member scheduled for: 02/08/2021  Julian Hy, [place], [place] Management  Direct Dial: [phone]

## 2020-12-22 DIAGNOSIS — I5032 Chronic diastolic (congestive) heart failure: Secondary | ICD-10-CM | POA: Diagnosis not present

## 2020-12-22 DIAGNOSIS — Z471 Aftercare following joint replacement surgery: Secondary | ICD-10-CM | POA: Diagnosis not present

## 2020-12-22 DIAGNOSIS — Z96611 Presence of right artificial shoulder joint: Secondary | ICD-10-CM | POA: Diagnosis not present

## 2020-12-22 DIAGNOSIS — M069 Rheumatoid arthritis, unspecified: Secondary | ICD-10-CM | POA: Diagnosis not present

## 2020-12-22 DIAGNOSIS — N1831 Chronic kidney disease, stage 3a: Secondary | ICD-10-CM | POA: Diagnosis not present

## 2020-12-22 DIAGNOSIS — I130 Hypertensive heart and chronic kidney disease with heart failure and stage 1 through stage 4 chronic kidney disease, or unspecified chronic kidney disease: Secondary | ICD-10-CM | POA: Diagnosis not present

## 2020-12-23 ENCOUNTER — Telehealth: Payer: Self-pay | Admitting: Internal Medicine

## 2020-12-23 NOTE — Telephone Encounter (Signed)
Spoke to [place]. Gave her the parameters listed. Aslo to advise [place] if her HR is below 40.

## 2020-12-23 NOTE — Telephone Encounter (Signed)
Home Health verbal orders [place] Name: [facility] number: [phone]  Requesting OT/PT/Skilled nursing/Social Work/Speech:  Reason:Verbal Orders for Orthopedic Surgeon Dr Roland Rack. Lucendia Herrlich for Heart Rate and Blood Pressure  Frequency:OT 1 wk 1 2 wk 7 1 wk 1  Please forward to [facility] pool or providers CMA

## 2020-12-24 ENCOUNTER — Other Ambulatory Visit: Payer: Self-pay | Admitting: Adult Health

## 2020-12-24 DIAGNOSIS — N1831 Chronic kidney disease, stage 3a: Secondary | ICD-10-CM | POA: Diagnosis not present

## 2020-12-24 DIAGNOSIS — M069 Rheumatoid arthritis, unspecified: Secondary | ICD-10-CM | POA: Diagnosis not present

## 2020-12-24 DIAGNOSIS — Z79899 Other long term (current) drug therapy: Secondary | ICD-10-CM | POA: Diagnosis not present

## 2020-12-24 DIAGNOSIS — M1A00X0 Idiopathic chronic gout, unspecified site, without tophus (tophi): Secondary | ICD-10-CM | POA: Diagnosis not present

## 2020-12-24 DIAGNOSIS — Z96611 Presence of right artificial shoulder joint: Secondary | ICD-10-CM | POA: Diagnosis not present

## 2020-12-24 DIAGNOSIS — I130 Hypertensive heart and chronic kidney disease with heart failure and stage 1 through stage 4 chronic kidney disease, or unspecified chronic kidney disease: Secondary | ICD-10-CM | POA: Diagnosis not present

## 2020-12-24 DIAGNOSIS — I5032 Chronic diastolic (congestive) heart failure: Secondary | ICD-10-CM

## 2020-12-24 DIAGNOSIS — Z471 Aftercare following joint replacement surgery: Secondary | ICD-10-CM | POA: Diagnosis not present

## 2020-12-24 DIAGNOSIS — M0579 Rheumatoid arthritis with rheumatoid factor of multiple sites without organ or systems involvement: Secondary | ICD-10-CM | POA: Diagnosis not present

## 2020-12-27 ENCOUNTER — Other Ambulatory Visit: Payer: Self-pay | Admitting: Internal Medicine

## 2020-12-27 DIAGNOSIS — I480 Paroxysmal atrial fibrillation: Secondary | ICD-10-CM

## 2020-12-27 DIAGNOSIS — I10 Essential (primary) hypertension: Secondary | ICD-10-CM

## 2020-12-29 DIAGNOSIS — N1831 Chronic kidney disease, stage 3a: Secondary | ICD-10-CM | POA: Diagnosis not present

## 2020-12-29 DIAGNOSIS — Z96611 Presence of right artificial shoulder joint: Secondary | ICD-10-CM | POA: Diagnosis not present

## 2020-12-29 DIAGNOSIS — I130 Hypertensive heart and chronic kidney disease with heart failure and stage 1 through stage 4 chronic kidney disease, or unspecified chronic kidney disease: Secondary | ICD-10-CM | POA: Diagnosis not present

## 2020-12-29 DIAGNOSIS — I5032 Chronic diastolic (congestive) heart failure: Secondary | ICD-10-CM | POA: Diagnosis not present

## 2020-12-29 DIAGNOSIS — M069 Rheumatoid arthritis, unspecified: Secondary | ICD-10-CM | POA: Diagnosis not present

## 2020-12-29 DIAGNOSIS — Z471 Aftercare following joint replacement surgery: Secondary | ICD-10-CM | POA: Diagnosis not present

## 2020-12-30 DIAGNOSIS — I5032 Chronic diastolic (congestive) heart failure: Secondary | ICD-10-CM | POA: Diagnosis not present

## 2020-12-30 DIAGNOSIS — Z471 Aftercare following joint replacement surgery: Secondary | ICD-10-CM | POA: Diagnosis not present

## 2020-12-30 DIAGNOSIS — M069 Rheumatoid arthritis, unspecified: Secondary | ICD-10-CM | POA: Diagnosis not present

## 2020-12-30 DIAGNOSIS — Z96611 Presence of right artificial shoulder joint: Secondary | ICD-10-CM | POA: Diagnosis not present

## 2020-12-30 DIAGNOSIS — I130 Hypertensive heart and chronic kidney disease with heart failure and stage 1 through stage 4 chronic kidney disease, or unspecified chronic kidney disease: Secondary | ICD-10-CM | POA: Diagnosis not present

## 2020-12-30 DIAGNOSIS — N1831 Chronic kidney disease, stage 3a: Secondary | ICD-10-CM | POA: Diagnosis not present

## 2021-01-03 ENCOUNTER — Telehealth: Payer: Self-pay | Admitting: Internal Medicine

## 2021-01-03 DIAGNOSIS — M0579 Rheumatoid arthritis with rheumatoid factor of multiple sites without organ or systems involvement: Secondary | ICD-10-CM | POA: Diagnosis not present

## 2021-01-03 DIAGNOSIS — Z96611 Presence of right artificial shoulder joint: Secondary | ICD-10-CM | POA: Diagnosis not present

## 2021-01-03 NOTE — Telephone Encounter (Signed)
Pt called stating she would like to talk to [facility] regarding her hydrocodone

## 2021-01-03 NOTE — Telephone Encounter (Signed)
Pt called to let us know that the np in the rehab facility called in #[phone] of oxycodone 12-18-20

## 2021-01-04 DIAGNOSIS — N1831 Chronic kidney disease, stage 3a: Secondary | ICD-10-CM | POA: Diagnosis not present

## 2021-01-04 DIAGNOSIS — I130 Hypertensive heart and chronic kidney disease with heart failure and stage 1 through stage 4 chronic kidney disease, or unspecified chronic kidney disease: Secondary | ICD-10-CM | POA: Diagnosis not present

## 2021-01-04 DIAGNOSIS — Z96611 Presence of right artificial shoulder joint: Secondary | ICD-10-CM | POA: Diagnosis not present

## 2021-01-04 DIAGNOSIS — Z471 Aftercare following joint replacement surgery: Secondary | ICD-10-CM | POA: Diagnosis not present

## 2021-01-04 DIAGNOSIS — M069 Rheumatoid arthritis, unspecified: Secondary | ICD-10-CM | POA: Diagnosis not present

## 2021-01-04 DIAGNOSIS — I5032 Chronic diastolic (congestive) heart failure: Secondary | ICD-10-CM | POA: Diagnosis not present

## 2021-01-05 ENCOUNTER — Telehealth: Payer: Self-pay | Admitting: Internal Medicine

## 2021-01-05 DIAGNOSIS — M069 Rheumatoid arthritis, unspecified: Secondary | ICD-10-CM | POA: Diagnosis not present

## 2021-01-05 DIAGNOSIS — I130 Hypertensive heart and chronic kidney disease with heart failure and stage 1 through stage 4 chronic kidney disease, or unspecified chronic kidney disease: Secondary | ICD-10-CM | POA: Diagnosis not present

## 2021-01-05 DIAGNOSIS — Z96611 Presence of right artificial shoulder joint: Secondary | ICD-10-CM | POA: Diagnosis not present

## 2021-01-05 DIAGNOSIS — N1831 Chronic kidney disease, stage 3a: Secondary | ICD-10-CM | POA: Diagnosis not present

## 2021-01-05 DIAGNOSIS — Z471 Aftercare following joint replacement surgery: Secondary | ICD-10-CM | POA: Diagnosis not present

## 2021-01-05 DIAGNOSIS — I5032 Chronic diastolic (congestive) heart failure: Secondary | ICD-10-CM | POA: Diagnosis not present

## 2021-01-05 NOTE — Telephone Encounter (Signed)
Spoke to April with Dr Alla German message.

## 2021-01-05 NOTE — Telephone Encounter (Signed)
Tina Pacheco from [facility] stated that pt pulse rate is 43 and she is asymptomatic and do the provider have any recommendations

## 2021-01-06 DIAGNOSIS — Z96611 Presence of right artificial shoulder joint: Secondary | ICD-10-CM | POA: Diagnosis not present

## 2021-01-06 DIAGNOSIS — M069 Rheumatoid arthritis, unspecified: Secondary | ICD-10-CM | POA: Diagnosis not present

## 2021-01-06 DIAGNOSIS — Z471 Aftercare following joint replacement surgery: Secondary | ICD-10-CM | POA: Diagnosis not present

## 2021-01-06 DIAGNOSIS — I130 Hypertensive heart and chronic kidney disease with heart failure and stage 1 through stage 4 chronic kidney disease, or unspecified chronic kidney disease: Secondary | ICD-10-CM | POA: Diagnosis not present

## 2021-01-06 DIAGNOSIS — I5032 Chronic diastolic (congestive) heart failure: Secondary | ICD-10-CM | POA: Diagnosis not present

## 2021-01-06 DIAGNOSIS — N1831 Chronic kidney disease, stage 3a: Secondary | ICD-10-CM | POA: Diagnosis not present

## 2021-01-10 ENCOUNTER — Ambulatory Visit (INDEPENDENT_AMBULATORY_CARE_PROVIDER_SITE_OTHER): Payer: Medicare Other

## 2021-01-10 DIAGNOSIS — I480 Paroxysmal atrial fibrillation: Secondary | ICD-10-CM

## 2021-01-10 DIAGNOSIS — I130 Hypertensive heart and chronic kidney disease with heart failure and stage 1 through stage 4 chronic kidney disease, or unspecified chronic kidney disease: Secondary | ICD-10-CM | POA: Diagnosis not present

## 2021-01-10 DIAGNOSIS — M069 Rheumatoid arthritis, unspecified: Secondary | ICD-10-CM | POA: Diagnosis not present

## 2021-01-10 DIAGNOSIS — Z96611 Presence of right artificial shoulder joint: Secondary | ICD-10-CM | POA: Diagnosis not present

## 2021-01-10 DIAGNOSIS — Z471 Aftercare following joint replacement surgery: Secondary | ICD-10-CM | POA: Diagnosis not present

## 2021-01-10 DIAGNOSIS — Z9181 History of falling: Secondary | ICD-10-CM

## 2021-01-10 DIAGNOSIS — I890 Lymphedema, not elsewhere classified: Secondary | ICD-10-CM

## 2021-01-10 DIAGNOSIS — I5032 Chronic diastolic (congestive) heart failure: Secondary | ICD-10-CM

## 2021-01-10 DIAGNOSIS — N1831 Chronic kidney disease, stage 3a: Secondary | ICD-10-CM | POA: Diagnosis not present

## 2021-01-10 NOTE — Chronic Care Management (AMB) (Signed)
Chronic Care Management   CCM RN Visit Note  01/12/2021 Name: Tina Pacheco MRN: [id number] DOB: [birth year]  Subjective: Tina Pacheco is a 70 y.o. year old female who is a primary care patient of Tina Carbon, MD. The care management team was consulted for assistance with disease management and care coordination needs.    Engaged with patient by telephone for follow up visit in response to provider referral for case management and/or care coordination services.   Consent to Services:  The patient was given information about Chronic Care Management services, agreed to services, and gave verbal consent prior to initiation of services.  Please see initial visit note for detailed documentation.   Patient agreed to services and verbal consent obtained.   Assessment: Review of patient past medical history, allergies, medications, health status, including review of consultants reports, laboratory and other test data, was performed as part of comprehensive evaluation and provision of chronic care management services.   SDOH (Social Determinants of Health) assessments and interventions performed:    CCM Care Plan  Allergies  Allergen Reactions   Fluoxetine Other (See Comments)    Headache, shaking, sleep issues Headache, shaking, sleep issues   Codeine     Nausea and vomiting/only when taking too much    Outpatient Encounter Medications as of 01/10/2021  Medication Sig Note   amiodarone (PACERONE) 200 MG tablet Take 1 tablet (200 mg total) by mouth at bedtime. GIVE ONE TABLET BY AT BEDTIME    apixaban (ELIQUIS) 5 MG TABS tablet TAKE 1 TABLET(5 MG) BY MOUTH TWICE DAILY    atorvastatin (LIPITOR) 40 MG tablet Take 1 tablet (40 mg total) by mouth daily.    bisacodyl (DULCOLAX) 10 MG suppository Place 10 mg rectally as needed for moderate constipation.    ketoconazole (NIZORAL) 2 % cream APPLY A SMALL AMOUNT TO AFFECTED AREA ONCE A DAY TO RASH ON BUTTOCKS, GROIN    leflunomide  (ARAVA) 20 MG tablet Take 1 tablet (20 mg total) by mouth daily.    Magnesium Hydroxide (MILK OF MAGNESIA PO) Take by mouth as needed.    metolazone (ZAROXOLYN) 2.5 MG tablet Take 1 tablet (2.5 mg total) by mouth daily as needed. Give 1 tablet PRN, daily by mouth for weight gain of 5lbs in a week.    metoprolol succinate (TOPROL-XL) 50 MG 24 hr tablet TAKE 1 TABLET BY MOUTH EVERY DAY WITH OR IMMEDIATELY FOLLOWING A MEAL    montelukast (SINGULAIR) 10 MG tablet Take 1 tablet (10 mg total) by mouth at bedtime.    nitroGLYCERIN (NITROSTAT) 0.4 MG SL tablet Place 1 tablet (0.4 mg total) under the tongue every 5 (five) minutes as needed for chest pain.    nystatin (MYCOSTATIN/NYSTOP) powder Apply 1 application topically 3 (three) times daily as needed.    Oxycodone HCl 20 MG TABS Take 1 tablet (20 mg total) by mouth in the morning and at bedtime.    polyethylene glycol (MIRALAX / GLYCOLAX) packet Take 17 g by mouth daily as needed for mild constipation.    Potassium Chloride ER 20 MEQ TBCR Take 20 mEq by mouth daily at 12 noon.    SF 5000 PLUS 1.1 % CREA dental cream Place 1 application onto teeth 2 (two) times daily.    spironolactone (ALDACTONE) 25 MG tablet TAKE 1 TABLET(25 MG) BY MOUTH DAILY    terazosin (HYTRIN) 10 MG capsule TAKE 1 CAPSULE(10 MG) BY MOUTH AT BEDTIME    torsemide (DEMADEX) 20 MG tablet Take 2  tablets (40 mg total) by mouth daily. Give two tablets (40 mg) by mouth daily for CHF 01/10/2021: Patient states she sometimes takes 3 per advisement of her doctor if weight increases over 3 lbs overnight, 5 lbs in a week.    [DISCONTINUED] gabapentin (NEURONTIN) 300 MG capsule Take 1 capsule (300 mg total) by mouth at bedtime.    [DISCONTINUED] meclizine (ANTIVERT) 25 MG tablet TAKE 1 TABLET(25 MG) BY MOUTH THREE TIMES DAILY AS NEEDED FOR DIZZINESS    [DISCONTINUED] ondansetron (ZOFRAN) 4 MG tablet Take 1 tablet (4 mg total) by mouth every 6 (six) hours as needed for nausea.    NON FORMULARY  Diet:Heart healthy    Sodium Phosphates (RA SALINE ENEMA RE) Place rectally as needed.    [DISCONTINUED] Amino Acids-Protein Hydrolys (PRO-STAT PO) Take 30 mLs by mouth. (Patient not taking: Reported on 01/10/2021)    [DISCONTINUED] HYDROcodone-acetaminophen (NORCO/VICODIN) 5-325 MG tablet Take 1 tablet by mouth every 6 (six) hours as needed for moderate pain.    [DISCONTINUED] torsemide (DEMADEX) 20 MG tablet Take 1 tablet (20 mg total) by mouth daily as needed. for shortness of breath    No facility-administered encounter medications on file as of 01/10/2021.    Patient Active Problem List   Diagnosis Date Noted   Decubitus ulcer of sacral region, stage 2 ([place]) 11/12/2020   Closed dislocation of right shoulder 11/10/2020   Stage 3b chronic kidney disease ([place]) 07/07/2020   Abnormal nuclear stress test    Demand ischemia ([place])    Polymyalgia rheumatica ([place]) 02/03/2020   Debility 01/28/2020   Chronic diastolic heart failure ([place])    Pressure injury of skin 03/31/2019   COPD (chronic obstructive pulmonary disease) ([place]) 03/30/2019   Urge incontinence of urine 12/23/2018   Left leg pain 09/18/2018   Chronic gouty arthropathy without tophi 04/01/2018   Preventative health care 03/20/2018   Mood disorder ([place]) 03/20/2018   Advance directive discussed with patient 03/20/2018   Lymphedema 11/26/2017   Narcotic dependence ([place]) 03/09/2017   Vertigo 06/28/2016   Bronchiectasis without acute exacerbation ([place]) 06/15/2015   DDD (degenerative disc disease), lumbosacral 03/30/2015   Sleep apnea    Atherosclerotic heart disease of native coronary artery with angina pectoris ([place]) 08/25/2014   Paroxysmal A-fib ([place]) 08/25/2014   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   RLS (restless legs syndrome) 07/06/2014   Gout 05/26/2014   Chronic pain syndrome 05/26/2014   Migraine 04/08/2014   Acquired deformity of arm 01/05/2014   Back pain 07/22/2013   Metatarsalgia of right foot  07/22/2013   Insomnia 07/15/2013   OA (osteoarthritis) 11/20/2012   DJD (degenerative joint disease) of cervical spine 09/10/2012   Allergic rhinitis 07/10/2011   Rheumatoid arthritis ([place]) 03/06/2011   Morbid obesity with BMI of 50.0-59.9, adult ([place]) 06/22/2010   HLD (hyperlipidemia) 06/22/2010   HTN (hypertension) 06/22/2010   CAD (coronary artery disease) 06/22/2010   Long term current use of anticoagulant therapy 05/27/2010    Conditions to be addressed/monitored:Atrial Fibrillation, CHF, and Lymphedema  Care Plan : [facility] Plan of care  Updates made by Dannielle Karvonen, RN since 01/12/2021 12:00 AM     Problem: Chronic Disesease management, education and care coordination needs ( HF, A-fib, Lymphedema)   Priority: High     Long-Range Goal: Development of plan of care to address chronic disease management and care coordination needs. (HF, A-fib, Lymphedema)   Start Date: 01/10/2021  Expected End Date: 04/26/2021  Priority: High  Note:  Current Barriers:  Knowledge Deficits related to plan of care for management of Atrial Fibrillation, CHF, and Lymphedema  Chronic Disease Management support and education needs related to Atrial Fibrillation, CHF, and Lymphedema Patient reports blood pressures have ranged from 120/60 to 130's/ 80.  Pulse has ranged 50-60's.  Patient states she had an episode over the last month where her pulse was 43.  She states this was called to her primary care provider by her Home health nurse.  She is to continue to monitor at this time.  Patient reports right shoulder surgery on 11/11/2020 followed by rehab facility stay x 4 week, currently home being followed by home health for PT/ OT services with [facility]. She reports having post surgery follow up with surgeon and healing well. .  Patient states her current weight is 288 lbs. She reports weighing daily and recording.  She reports occasional swelling in feet / ankles / legs.  She states she does not  wear her ted hose as often because they cause her pain.   RNCM Clinical Goal(s):  Patient will verbalize basic understanding of Atrial Fibrillation, CHF, and Lymphedema  disease process and self health management plan as evidenced by the following  through collaboration with RN Care manager, provider, and care team:   -verbalize understanding of plan for management of Atrial Fibrillation, CHF, and Lymphedema -take all medications exactly as prescribed and will call provider for medication related questions -attend all scheduled medical appointments:  -continue to work with RN Care Manager to address care management and care coordination needs related to  Atrial Fibrillation, CHF, and Lymphedema Interventions: 1:1 collaboration with primary care provider regarding development and update of comprehensive plan of care as evidenced by provider attestation and co-signature Inter-disciplinary care team collaboration (see longitudinal plan of care) Evaluation of current treatment plan related to  self management and patient's adherence to plan as established by provider  Post procedure transition of care: (Short term goal) Goal on track:  Yes  Evaluation of current treatment plan related to  post procedure ( right shoulder surgery) , self-management and patient's adherence to plan as established by provider. Discussed plans with patient for ongoing care management follow up and provided patient with direct contact information for care management team Reviewed medications with patient and discussed  ; Advised patient to continue to follow treatment plan of Home health services   Advised patient to call her doctor for any new, ongoing or worsening concerns.   Lymphedema Interventions:  Goal on track:  Yes Evaluation of current treatment plan related to Atrial Fibrillation, CHF, and Lymphedema , self-management and patient's adherence to plan as established by provider. Discussed plans with patient for  ongoing care management follow up and provided patient with direct contact information for care management team Evaluation of current treatment plan related to Lymphedema and patient's adherence to plan as established by provider; Reviewed medications with patient and discussed  ; Reviewed scheduled/upcoming provider appointments including; Discussed plans with patient for ongoing care management follow up and provided patient with direct contact information for care management team; Fall risk strategies discussed Patient advised to elevate legs when sitting,   Heart Failure Interventions: Goal on track: yes Provided education on low sodium diet; Advised patient to weigh each morning after emptying bladder; Discussed importance of daily weight and advised patient to weigh and record daily; Discussed the importance of keeping all appointments with provider; Heart failure action plan reviewed  AFIB Interventions: Goal on track: Yes Reviewed  importance of adherence to anticoagulant exactly as prescribed; Counseled on bleeding risk associated with anticoagulants and importance of self-monitoring for signs/symptoms of bleeding; Counseled on avoidance of NSAIDs due to increased bleeding risk with anticoagulants;  Reviewed atrial fibrillation symptoms.  Advised to notify doctor for mild/ moderate symptoms and call 911 for severe symptoms.  Patient Goals/Self-Care Activities: Patient will self administer medications as prescribed as evidenced by self report/primary caregiver report  Patient will attend all scheduled provider appointments as evidenced by clinician review of documented attendance to scheduled appointments and patient/caregiver report Patient will call pharmacy for medication refills as evidenced by patient report and review of pharmacy fill history as appropriate weigh myself daily - check pulse (heart) rate once a day Call your doctor for new, ongoing or worsening symptoms.   Continue to follow treatment plan from home health services.  Monitor blood pressure at least 1-2 times per week.  Notify provider for blood pressure concerns.  Follow a low salt diet Elevate legs when sitting.  Follow fall prevention strategies:  Use your assistive device ( cane or walker) if advised by your doctor, Make surer walkways are clear of clutter, cords, and throw rugs,  Make sure there is good lighting throughout your home.         Plan:The patient has been provided with contact information for the care management team and has been advised to call with any health related questions or concerns.  The care management team will reach out to the patient again over the next 1-2 months. Quinn Plowman RN,BSN,CCM RN Case Manager [place]  [phone]

## 2021-01-10 NOTE — Patient Instructions (Addendum)
Visit Information: Thank you for taking the time to speak with me today.   Patient Goals/Self-Care Activities: Patient will self administer medications as prescribed as evidenced by self report/primary caregiver report  Patient will attend all scheduled provider appointments as evidenced by clinician review of documented attendance to scheduled appointments and patient/caregiver report Patient will call pharmacy for medication refills as evidenced by patient report and review of pharmacy fill history as appropriate weigh myself daily - check pulse (heart) rate once a day Call your doctor for new, ongoing or worsening symptoms.  Continue to follow treatment plan from home health services.  Monitor blood pressure at least 1-2 times per week.  Notify provider for blood pressure concerns.  Follow a low salt diet Elevate legs when sitting.  Follow fall prevention strategies:  Use your assistive device ( cane or walker) if advised by your doctor, Make surer walkways are clear of clutter, cords, and throw rugs,  Make sure there is good lighting throughout your home.   Patient verbalizes understanding of instructions provided today and agrees to view in [place].   The patient has been provided with contact information for the care management team and has been advised to call with any health related questions or concerns.  The care management team will reach out to the patient  on February 07, 2021 at 10:00 am.    Quinn Plowman RN,BSN,CCM RN Case Manager [place]  [phone]

## 2021-01-11 ENCOUNTER — Encounter: Payer: Self-pay | Admitting: Internal Medicine

## 2021-01-11 ENCOUNTER — Ambulatory Visit (INDEPENDENT_AMBULATORY_CARE_PROVIDER_SITE_OTHER): Payer: Medicare Other | Admitting: Internal Medicine

## 2021-01-11 ENCOUNTER — Other Ambulatory Visit: Payer: Self-pay

## 2021-01-11 DIAGNOSIS — I5032 Chronic diastolic (congestive) heart failure: Secondary | ICD-10-CM

## 2021-01-11 DIAGNOSIS — I480 Paroxysmal atrial fibrillation: Secondary | ICD-10-CM

## 2021-01-11 DIAGNOSIS — G2581 Restless legs syndrome: Secondary | ICD-10-CM | POA: Diagnosis not present

## 2021-01-11 DIAGNOSIS — G894 Chronic pain syndrome: Secondary | ICD-10-CM

## 2021-01-11 DIAGNOSIS — F1120 Opioid dependence, uncomplicated: Secondary | ICD-10-CM

## 2021-01-11 DIAGNOSIS — J449 Chronic obstructive pulmonary disease, unspecified: Secondary | ICD-10-CM

## 2021-01-11 DIAGNOSIS — I2510 Atherosclerotic heart disease of native coronary artery without angina pectoris: Secondary | ICD-10-CM | POA: Diagnosis not present

## 2021-01-11 MED ORDER — MECLIZINE HCL 25 MG PO TABS: ORAL_TABLET | ORAL | 5 refills | Status: DC

## 2021-01-11 MED ORDER — GABAPENTIN 300 MG PO CAPS: 300.0000 mg | ORAL_CAPSULE | Freq: Every day | ORAL | 3 refills | Status: DC

## 2021-01-11 NOTE — Assessment & Plan Note (Signed)
Weight down 20#---seems to be largely fluid as her legs are much better Continue the torsemide

## 2021-01-11 NOTE — Progress Notes (Signed)
Subjective:    Patient ID: Tina Pacheco, female    DOB: August 17, 1950, 70 y.o.   MRN: [id number]  HPI Here with husband for hospital follow up This visit occurred during the SARS-CoV-2 public health emergency.  Safety protocols were in place, including screening questions prior to the visit, additional usage of staff PPE, and extensive cleaning of exam room while observing appropriate contact time as indicated for disinfecting solutions.   Went into the hospital for dislocated shoulder--right Wound up having shoulder replacement Went to [place] for rehab Home about 3 weeks ago  Walks with hemiwalker---needs help with any steps or ramp (husband with gait belt) Needs help with showering (aide) Dresses herself and uses bathroom PT and OT at home---mostly still passive ROM (and some active work) Pain control okay Back pain is stable  Bradycardia noted by RN last week---43 She was at rest---no symptoms No dizziness No chest pain or SOB No cough or wheezing  Last GFR 43  Current Outpatient Medications on File Prior to Visit  Medication Sig Dispense Refill   amiodarone (PACERONE) 200 MG tablet Take 1 tablet (200 mg total) by mouth at bedtime. GIVE ONE TABLET BY AT BEDTIME 30 tablet 0   apixaban (ELIQUIS) 5 MG TABS tablet TAKE 1 TABLET(5 MG) BY MOUTH TWICE DAILY 60 tablet 0   atorvastatin (LIPITOR) 40 MG tablet Take 1 tablet (40 mg total) by mouth daily. 30 tablet 0   bisacodyl (DULCOLAX) 10 MG suppository Place 10 mg rectally as needed for moderate constipation.     gabapentin (NEURONTIN) 300 MG capsule Take 1 capsule (300 mg total) by mouth at bedtime. 30 capsule 0   ketoconazole (NIZORAL) 2 % cream APPLY A SMALL AMOUNT TO AFFECTED AREA ONCE A DAY TO RASH ON BUTTOCKS, GROIN 60 g 1   leflunomide (ARAVA) 20 MG tablet Take 1 tablet (20 mg total) by mouth daily. 30 tablet 0   Magnesium Hydroxide (MILK OF MAGNESIA PO) Take by mouth as needed.     meclizine (ANTIVERT) 25 MG tablet TAKE  1 TABLET(25 MG) BY MOUTH THREE TIMES DAILY AS NEEDED FOR DIZZINESS 90 tablet 5   metolazone (ZAROXOLYN) 2.5 MG tablet Take 1 tablet (2.5 mg total) by mouth daily as needed. Give 1 tablet PRN, daily by mouth for weight gain of 5lbs in a week. 30 tablet 0   metoprolol succinate (TOPROL-XL) 50 MG 24 hr tablet TAKE 1 TABLET BY MOUTH EVERY DAY WITH OR IMMEDIATELY FOLLOWING A MEAL 30 tablet 0   montelukast (SINGULAIR) 10 MG tablet Take 1 tablet (10 mg total) by mouth at bedtime. 30 tablet 0   nitroGLYCERIN (NITROSTAT) 0.4 MG SL tablet Place 1 tablet (0.4 mg total) under the tongue every 5 (five) minutes as needed for chest pain. 20 tablet 12   NON FORMULARY Diet:Heart healthy     nystatin (MYCOSTATIN/NYSTOP) powder Apply 1 application topically 3 (three) times daily as needed. 60 g 0   Oxycodone HCl 20 MG TABS Take 1 tablet (20 mg total) by mouth in the morning and at bedtime. 60 tablet 0   polyethylene glycol (MIRALAX / GLYCOLAX) packet Take 17 g by mouth daily as needed for mild constipation. 14 each 0   Potassium Chloride ER 20 MEQ TBCR Take 20 mEq by mouth daily at 12 noon. 30 tablet 0   SF 5000 PLUS 1.1 % CREA dental cream Place 1 application onto teeth 2 (two) times daily.  1   Sodium Phosphates (RA SALINE ENEMA RE) Place  rectally as needed.     spironolactone (ALDACTONE) 25 MG tablet TAKE 1 TABLET(25 MG) BY MOUTH DAILY 30 tablet 0   terazosin (HYTRIN) 10 MG capsule TAKE 1 CAPSULE(10 MG) BY MOUTH AT BEDTIME 30 capsule 0   torsemide (DEMADEX) 20 MG tablet Take 2 tablets (40 mg total) by mouth daily. Give two tablets (40 mg) by mouth daily for CHF 60 tablet 0   No current facility-administered medications on file prior to visit.    Allergies  Allergen Reactions   Fluoxetine Other (See Comments)    Headache, shaking, sleep issues Headache, shaking, sleep issues   Codeine     Nausea and vomiting/only when taking too much    Past Medical History:  Diagnosis Date   Arthritis    RA   Asthma     Basal cell carcinoma    Cataract 2018   bilateral eyes; corrected with surgery   Claustrophobia    Collagen vascular disease (HCC)    RA   COPD (chronic obstructive pulmonary disease) ([place])    Diastolic dysfunction    a. echo 07/2014: EF 55-60%, no RWMA, GR2DD, mild MR, LA moderately dilated, PASP 38 mm Hg   Dyslipidemia    Headache    migraines   Hemihypertrophy    History of cardiac cath    a. cardiac cath 05/24/2010 - nonobstructive CAD   History of gout    Hyperplastic colonic polyp 2003   Hypertension    Hypokalemia    Morbid obesity ([place])    PAF (paroxysmal atrial fibrillation) ([place])    a. on Pradaxa; b. CHADSVASc at least 2 (HTN & female)   Rheumatoid arthritis(714.0)    Sleep apnea    a. not compliant with CPAP    Past Surgical History:  Procedure Laterality Date   ABDOMINAL HYSTERECTOMY     APPLICATION OF WOUND VAC Right 08/09/2017   Procedure: APPLICATION OF WOUND VAC;  Surgeon: Albertine Patricia, DPM;  Location: [facility];  Service: Podiatry;  Laterality: Right;   CARDIAC CATHETERIZATION  05/24/2010   nonobstructive CAD   CATARACT EXTRACTION W/ INTRAOCULAR LENS IMPLANT Right 06/12/2016   Dr. Darleen Crocker   CATARACT EXTRACTION W/ INTRAOCULAR LENS IMPLANT Left 06/26/2016   Dr. Darleen Crocker   CESAREAN SECTION     CHOLECYSTECTOMY     COLONOSCOPY  06/12/2011   Procedure: COLONOSCOPY;  Surgeon: Juanita Craver, MD;  Location: [facility];  Service: Endoscopy;  Laterality: N/A;   COLONOSCOPY N/A 03/17/2013   Procedure: COLONOSCOPY;  Surgeon: Juanita Craver, MD;  Location: [facility];  Service: Endoscopy;  Laterality: N/A;   EYE SURGERY     FOOT ARTHRODESIS Right 07/13/2017   Procedure: ARTHRODESIS FOOT-MULTI.FUSIONS (6 JOINTS);  Surgeon: Albertine Patricia, DPM;  Location: [facility];  Service: Podiatry;  Laterality: Right;   IRRIGATION AND DEBRIDEMENT FOOT Right 08/09/2017   Procedure: IRRIGATION AND DEBRIDEMENT FOOT;  Surgeon: Albertine Patricia, DPM;  Location: [facility];   Service: Podiatry;  Laterality: Right;   KNEE ARTHROSCOPY     bilateral   LEFT HEART CATH AND CORONARY ANGIOGRAPHY N/A 06/04/2020   Procedure: LEFT HEART CATH AND CORONARY ANGIOGRAPHY;  Surgeon: Wellington Hampshire, MD;  Location: [facility];  Service: Cardiovascular;  Laterality: N/A;   REVERSE SHOULDER ARTHROPLASTY Right 11/16/2020   Procedure: REVERSE SHOULDER ARTHROPLASTY;  Surgeon: Corky Mull, MD;  Location: [facility];  Service: Orthopedics;  Laterality: Right;   SHOULDER CLOSED REDUCTION Right 11/11/2020   Procedure: CLOSED REDUCTION SHOULDER;  Surgeon: Milagros Evener  J, MD;  Location: [facility];  Service: Orthopedics;  Laterality: Right;   SHOULDER INJECTION Right 11/11/2020   Procedure: SHOULDER INJECTION;  Surgeon: Corky Mull, MD;  Location: [facility];  Service: Orthopedics;  Laterality: Right;   SINUSOTOMY     TOOTH EXTRACTION  12/2016   VAGINAL HYSTERECTOMY      Family History  Problem Relation Age of Onset   Emphysema Father        smoked   Arthritis Father    Tuberculosis Mother    Parkinsonism Mother    Cancer Sister    Diabetes type II Sister    Breast cancer Sister    Breast cancer Maternal Aunt    Tuberculosis Sister     Social History   Socioeconomic History   Marital status: Married    Spouse name: Not on file   Number of children: 1   Years of education: Not on file   Highest education level: Not on file  Occupational History   Occupation: Furniture conservator/restorer: [employer]    Comment: Retired 2007  Tobacco Use   Smoking status: Never   Smokeless tobacco: Never  Vaping Use   Vaping Use: Never used  Substance and Sexual Activity   Alcohol use: No   Drug use: No   Sexual activity: Not Currently  Other Topics Concern   Not on file  Social History Narrative   No living will   Husband, then daughter should be decision maker   Would accept resuscitation attempts   Not sure about tube feeds   Social Determinants of Health   Financial  Resource Strain: Not on file  Food Insecurity: No Food Insecurity   Worried About Charity fundraiser in the Last Year: Never true   [place] in the Last Year: Never true  Transportation Needs: No Transportation Needs   Lack of Transportation (Medical): No   Lack of Transportation (Non-Medical): No  Physical Activity: Not on file  Stress: Not on file  Social Connections: Not on file  Intimate Partner Violence: Not on file   Review of Systems Trying to eat better---has lost 20# since my last visit. A lot of this may be fluid Not sleeping that well in past 2 days--in recliner mostly    Objective:   Physical Exam Constitutional:      Appearance: Normal appearance.  Cardiovascular:     Rate and Rhythm: Normal rate and regular rhythm.     Heart sounds: No murmur heard.   No gallop.  Pulmonary:     Effort: Pulmonary effort is normal.     Breath sounds: Normal breath sounds. No wheezing or rales.  Abdominal:     Palpations: Abdomen is soft.     Tenderness: There is no abdominal tenderness.  Musculoskeletal:     Cervical back: Neck supple.     Comments: Calves are big but not tense or with clear edema  Lymphadenopathy:     Cervical: No cervical adenopathy.  Neurological:     Mental Status: She is alert.           Assessment & Plan:

## 2021-01-11 NOTE — Assessment & Plan Note (Signed)
Ongoing with her back Shoulder is better since arthroplasty Will continue the oxycodone at same dose

## 2021-01-11 NOTE — Assessment & Plan Note (Signed)
Stable respiratory status 

## 2021-01-11 NOTE — Assessment & Plan Note (Signed)
Some asymptomatic bradycardia noted on the metoprolol Today HR 60 and regular Continues on eliquis

## 2021-01-11 NOTE — Assessment & Plan Note (Signed)
PDMP reviewed No concerns 

## 2021-01-12 DIAGNOSIS — N1831 Chronic kidney disease, stage 3a: Secondary | ICD-10-CM | POA: Diagnosis not present

## 2021-01-12 DIAGNOSIS — I130 Hypertensive heart and chronic kidney disease with heart failure and stage 1 through stage 4 chronic kidney disease, or unspecified chronic kidney disease: Secondary | ICD-10-CM | POA: Diagnosis not present

## 2021-01-12 DIAGNOSIS — Z96611 Presence of right artificial shoulder joint: Secondary | ICD-10-CM | POA: Diagnosis not present

## 2021-01-12 DIAGNOSIS — Z471 Aftercare following joint replacement surgery: Secondary | ICD-10-CM | POA: Diagnosis not present

## 2021-01-12 DIAGNOSIS — M069 Rheumatoid arthritis, unspecified: Secondary | ICD-10-CM | POA: Diagnosis not present

## 2021-01-12 DIAGNOSIS — I5032 Chronic diastolic (congestive) heart failure: Secondary | ICD-10-CM | POA: Diagnosis not present

## 2021-01-13 ENCOUNTER — Ambulatory Visit (INDEPENDENT_AMBULATORY_CARE_PROVIDER_SITE_OTHER): Payer: Medicare Other | Admitting: Nurse Practitioner

## 2021-01-13 DIAGNOSIS — Z471 Aftercare following joint replacement surgery: Secondary | ICD-10-CM | POA: Diagnosis not present

## 2021-01-13 DIAGNOSIS — M069 Rheumatoid arthritis, unspecified: Secondary | ICD-10-CM | POA: Diagnosis not present

## 2021-01-13 DIAGNOSIS — Z96611 Presence of right artificial shoulder joint: Secondary | ICD-10-CM | POA: Diagnosis not present

## 2021-01-13 DIAGNOSIS — I130 Hypertensive heart and chronic kidney disease with heart failure and stage 1 through stage 4 chronic kidney disease, or unspecified chronic kidney disease: Secondary | ICD-10-CM | POA: Diagnosis not present

## 2021-01-13 DIAGNOSIS — I5032 Chronic diastolic (congestive) heart failure: Secondary | ICD-10-CM | POA: Diagnosis not present

## 2021-01-13 DIAGNOSIS — N1831 Chronic kidney disease, stage 3a: Secondary | ICD-10-CM | POA: Diagnosis not present

## 2021-01-14 ENCOUNTER — Ambulatory Visit: Payer: Medicare Other | Admitting: Internal Medicine

## 2021-01-17 DIAGNOSIS — M069 Rheumatoid arthritis, unspecified: Secondary | ICD-10-CM | POA: Diagnosis not present

## 2021-01-17 DIAGNOSIS — Z96611 Presence of right artificial shoulder joint: Secondary | ICD-10-CM | POA: Diagnosis not present

## 2021-01-17 DIAGNOSIS — N1831 Chronic kidney disease, stage 3a: Secondary | ICD-10-CM | POA: Diagnosis not present

## 2021-01-17 DIAGNOSIS — I5032 Chronic diastolic (congestive) heart failure: Secondary | ICD-10-CM | POA: Diagnosis not present

## 2021-01-17 DIAGNOSIS — Z471 Aftercare following joint replacement surgery: Secondary | ICD-10-CM | POA: Diagnosis not present

## 2021-01-17 DIAGNOSIS — I130 Hypertensive heart and chronic kidney disease with heart failure and stage 1 through stage 4 chronic kidney disease, or unspecified chronic kidney disease: Secondary | ICD-10-CM | POA: Diagnosis not present

## 2021-01-18 DIAGNOSIS — Z6841 Body Mass Index (BMI) 40.0 and over, adult: Secondary | ICD-10-CM | POA: Diagnosis not present

## 2021-01-18 DIAGNOSIS — G4730 Sleep apnea, unspecified: Secondary | ICD-10-CM | POA: Diagnosis not present

## 2021-01-18 DIAGNOSIS — M069 Rheumatoid arthritis, unspecified: Secondary | ICD-10-CM | POA: Diagnosis not present

## 2021-01-18 DIAGNOSIS — Z7901 Long term (current) use of anticoagulants: Secondary | ICD-10-CM | POA: Diagnosis not present

## 2021-01-18 DIAGNOSIS — I480 Paroxysmal atrial fibrillation: Secondary | ICD-10-CM | POA: Diagnosis not present

## 2021-01-18 DIAGNOSIS — E785 Hyperlipidemia, unspecified: Secondary | ICD-10-CM | POA: Diagnosis not present

## 2021-01-18 DIAGNOSIS — E876 Hypokalemia: Secondary | ICD-10-CM | POA: Diagnosis not present

## 2021-01-18 DIAGNOSIS — Z7952 Long term (current) use of systemic steroids: Secondary | ICD-10-CM | POA: Diagnosis not present

## 2021-01-18 DIAGNOSIS — G43909 Migraine, unspecified, not intractable, without status migrainosus: Secondary | ICD-10-CM | POA: Diagnosis not present

## 2021-01-18 DIAGNOSIS — J440 Chronic obstructive pulmonary disease with acute lower respiratory infection: Secondary | ICD-10-CM | POA: Diagnosis not present

## 2021-01-18 DIAGNOSIS — J45909 Unspecified asthma, uncomplicated: Secondary | ICD-10-CM | POA: Diagnosis not present

## 2021-01-18 DIAGNOSIS — J189 Pneumonia, unspecified organism: Secondary | ICD-10-CM | POA: Diagnosis not present

## 2021-01-18 DIAGNOSIS — G894 Chronic pain syndrome: Secondary | ICD-10-CM | POA: Diagnosis not present

## 2021-01-18 DIAGNOSIS — I130 Hypertensive heart and chronic kidney disease with heart failure and stage 1 through stage 4 chronic kidney disease, or unspecified chronic kidney disease: Secondary | ICD-10-CM | POA: Diagnosis not present

## 2021-01-18 DIAGNOSIS — N1831 Chronic kidney disease, stage 3a: Secondary | ICD-10-CM | POA: Diagnosis not present

## 2021-01-18 DIAGNOSIS — I25119 Atherosclerotic heart disease of native coronary artery with unspecified angina pectoris: Secondary | ICD-10-CM | POA: Diagnosis not present

## 2021-01-18 DIAGNOSIS — I5032 Chronic diastolic (congestive) heart failure: Secondary | ICD-10-CM | POA: Diagnosis not present

## 2021-01-18 DIAGNOSIS — Z79899 Other long term (current) drug therapy: Secondary | ICD-10-CM | POA: Diagnosis not present

## 2021-01-18 DIAGNOSIS — Z96611 Presence of right artificial shoulder joint: Secondary | ICD-10-CM | POA: Diagnosis not present

## 2021-01-18 DIAGNOSIS — Z471 Aftercare following joint replacement surgery: Secondary | ICD-10-CM | POA: Diagnosis not present

## 2021-01-19 DIAGNOSIS — M069 Rheumatoid arthritis, unspecified: Secondary | ICD-10-CM | POA: Diagnosis not present

## 2021-01-19 DIAGNOSIS — Z471 Aftercare following joint replacement surgery: Secondary | ICD-10-CM | POA: Diagnosis not present

## 2021-01-19 DIAGNOSIS — I5032 Chronic diastolic (congestive) heart failure: Secondary | ICD-10-CM | POA: Diagnosis not present

## 2021-01-19 DIAGNOSIS — I130 Hypertensive heart and chronic kidney disease with heart failure and stage 1 through stage 4 chronic kidney disease, or unspecified chronic kidney disease: Secondary | ICD-10-CM | POA: Diagnosis not present

## 2021-01-19 DIAGNOSIS — N1831 Chronic kidney disease, stage 3a: Secondary | ICD-10-CM | POA: Diagnosis not present

## 2021-01-19 DIAGNOSIS — Z96611 Presence of right artificial shoulder joint: Secondary | ICD-10-CM | POA: Diagnosis not present

## 2021-01-24 ENCOUNTER — Telehealth: Payer: Self-pay | Admitting: Internal Medicine

## 2021-01-24 DIAGNOSIS — Z96611 Presence of right artificial shoulder joint: Secondary | ICD-10-CM | POA: Diagnosis not present

## 2021-01-24 DIAGNOSIS — I130 Hypertensive heart and chronic kidney disease with heart failure and stage 1 through stage 4 chronic kidney disease, or unspecified chronic kidney disease: Secondary | ICD-10-CM | POA: Diagnosis not present

## 2021-01-24 DIAGNOSIS — G894 Chronic pain syndrome: Secondary | ICD-10-CM

## 2021-01-24 DIAGNOSIS — N1831 Chronic kidney disease, stage 3a: Secondary | ICD-10-CM | POA: Diagnosis not present

## 2021-01-24 DIAGNOSIS — I5032 Chronic diastolic (congestive) heart failure: Secondary | ICD-10-CM | POA: Diagnosis not present

## 2021-01-24 DIAGNOSIS — M069 Rheumatoid arthritis, unspecified: Secondary | ICD-10-CM | POA: Diagnosis not present

## 2021-01-24 DIAGNOSIS — Z471 Aftercare following joint replacement surgery: Secondary | ICD-10-CM | POA: Diagnosis not present

## 2021-01-24 MED ORDER — OXYCODONE HCL 20 MG PO TABS: 1.0000 | ORAL_TABLET | Freq: Two times a day (BID) | ORAL | 0 refills | Status: DC

## 2021-01-24 NOTE — Telephone Encounter (Signed)
  Encourage patient to contact the pharmacy for refills or they can request refills through [place]:  Please schedule appointment if longer than 1 year  NEXT APPOINTMENT DATE:04/13/21  MEDICATION:Oxycodone HCl  Is the patient out of medication? yes  [address] [place]  Let patient know to contact pharmacy at the end of the day to make sure medication is ready.  Please notify patient to allow 48-72 hours to process  CLINICAL FILLS OUT ALL BELOW:   LAST REFILL:  QTY:  REFILL DATE:    OTHER COMMENTS:    Okay for refill?  Please advise

## 2021-01-24 NOTE — Telephone Encounter (Signed)
Refill sent.

## 2021-01-24 NOTE — Telephone Encounter (Signed)
Last Fill or Written Date and Quantity: 12/18/20 #60 Last Office Visit and Type: 01/11/21 Next Office Visit and Type: 02/15/263

## 2021-01-25 DIAGNOSIS — Z471 Aftercare following joint replacement surgery: Secondary | ICD-10-CM | POA: Diagnosis not present

## 2021-01-25 DIAGNOSIS — N1831 Chronic kidney disease, stage 3a: Secondary | ICD-10-CM | POA: Diagnosis not present

## 2021-01-25 DIAGNOSIS — M069 Rheumatoid arthritis, unspecified: Secondary | ICD-10-CM | POA: Diagnosis not present

## 2021-01-25 DIAGNOSIS — Z96611 Presence of right artificial shoulder joint: Secondary | ICD-10-CM | POA: Diagnosis not present

## 2021-01-25 DIAGNOSIS — I5032 Chronic diastolic (congestive) heart failure: Secondary | ICD-10-CM | POA: Diagnosis not present

## 2021-01-25 DIAGNOSIS — I130 Hypertensive heart and chronic kidney disease with heart failure and stage 1 through stage 4 chronic kidney disease, or unspecified chronic kidney disease: Secondary | ICD-10-CM | POA: Diagnosis not present

## 2021-01-26 ENCOUNTER — Other Ambulatory Visit: Payer: Self-pay | Admitting: Adult Health

## 2021-01-26 DIAGNOSIS — I10 Essential (primary) hypertension: Secondary | ICD-10-CM

## 2021-01-26 DIAGNOSIS — I4891 Unspecified atrial fibrillation: Secondary | ICD-10-CM

## 2021-01-26 DIAGNOSIS — M069 Rheumatoid arthritis, unspecified: Secondary | ICD-10-CM | POA: Diagnosis not present

## 2021-01-26 DIAGNOSIS — I509 Heart failure, unspecified: Secondary | ICD-10-CM | POA: Diagnosis not present

## 2021-01-26 DIAGNOSIS — I480 Paroxysmal atrial fibrillation: Secondary | ICD-10-CM

## 2021-01-26 DIAGNOSIS — Z96611 Presence of right artificial shoulder joint: Secondary | ICD-10-CM | POA: Diagnosis not present

## 2021-01-26 DIAGNOSIS — N1831 Chronic kidney disease, stage 3a: Secondary | ICD-10-CM | POA: Diagnosis not present

## 2021-01-26 DIAGNOSIS — Z7722 Contact with and (suspected) exposure to environmental tobacco smoke (acute) (chronic): Secondary | ICD-10-CM | POA: Diagnosis not present

## 2021-01-26 DIAGNOSIS — Z471 Aftercare following joint replacement surgery: Secondary | ICD-10-CM | POA: Diagnosis not present

## 2021-01-26 DIAGNOSIS — I130 Hypertensive heart and chronic kidney disease with heart failure and stage 1 through stage 4 chronic kidney disease, or unspecified chronic kidney disease: Secondary | ICD-10-CM | POA: Diagnosis not present

## 2021-01-26 DIAGNOSIS — I5032 Chronic diastolic (congestive) heart failure: Secondary | ICD-10-CM | POA: Diagnosis not present

## 2021-01-27 DIAGNOSIS — Z471 Aftercare following joint replacement surgery: Secondary | ICD-10-CM | POA: Diagnosis not present

## 2021-01-27 DIAGNOSIS — Z96611 Presence of right artificial shoulder joint: Secondary | ICD-10-CM | POA: Diagnosis not present

## 2021-01-27 DIAGNOSIS — I5032 Chronic diastolic (congestive) heart failure: Secondary | ICD-10-CM | POA: Diagnosis not present

## 2021-01-27 DIAGNOSIS — N1831 Chronic kidney disease, stage 3a: Secondary | ICD-10-CM | POA: Diagnosis not present

## 2021-01-27 DIAGNOSIS — M069 Rheumatoid arthritis, unspecified: Secondary | ICD-10-CM | POA: Diagnosis not present

## 2021-01-27 DIAGNOSIS — I130 Hypertensive heart and chronic kidney disease with heart failure and stage 1 through stage 4 chronic kidney disease, or unspecified chronic kidney disease: Secondary | ICD-10-CM | POA: Diagnosis not present

## 2021-02-01 DIAGNOSIS — M069 Rheumatoid arthritis, unspecified: Secondary | ICD-10-CM | POA: Diagnosis not present

## 2021-02-01 DIAGNOSIS — N1831 Chronic kidney disease, stage 3a: Secondary | ICD-10-CM | POA: Diagnosis not present

## 2021-02-01 DIAGNOSIS — I130 Hypertensive heart and chronic kidney disease with heart failure and stage 1 through stage 4 chronic kidney disease, or unspecified chronic kidney disease: Secondary | ICD-10-CM | POA: Diagnosis not present

## 2021-02-01 DIAGNOSIS — Z471 Aftercare following joint replacement surgery: Secondary | ICD-10-CM | POA: Diagnosis not present

## 2021-02-01 DIAGNOSIS — I5032 Chronic diastolic (congestive) heart failure: Secondary | ICD-10-CM | POA: Diagnosis not present

## 2021-02-01 DIAGNOSIS — Z96611 Presence of right artificial shoulder joint: Secondary | ICD-10-CM | POA: Diagnosis not present

## 2021-02-02 ENCOUNTER — Other Ambulatory Visit: Payer: Self-pay | Admitting: Adult Health

## 2021-02-02 DIAGNOSIS — M0579 Rheumatoid arthritis with rheumatoid factor of multiple sites without organ or systems involvement: Secondary | ICD-10-CM | POA: Diagnosis not present

## 2021-02-02 DIAGNOSIS — I5032 Chronic diastolic (congestive) heart failure: Secondary | ICD-10-CM

## 2021-02-02 DIAGNOSIS — I480 Paroxysmal atrial fibrillation: Secondary | ICD-10-CM

## 2021-02-02 DIAGNOSIS — E782 Mixed hyperlipidemia: Secondary | ICD-10-CM

## 2021-02-03 ENCOUNTER — Other Ambulatory Visit: Payer: Self-pay | Admitting: Adult Health

## 2021-02-03 DIAGNOSIS — N1831 Chronic kidney disease, stage 3a: Secondary | ICD-10-CM | POA: Diagnosis not present

## 2021-02-03 DIAGNOSIS — Z96611 Presence of right artificial shoulder joint: Secondary | ICD-10-CM | POA: Diagnosis not present

## 2021-02-03 DIAGNOSIS — I130 Hypertensive heart and chronic kidney disease with heart failure and stage 1 through stage 4 chronic kidney disease, or unspecified chronic kidney disease: Secondary | ICD-10-CM | POA: Diagnosis not present

## 2021-02-03 DIAGNOSIS — Z471 Aftercare following joint replacement surgery: Secondary | ICD-10-CM | POA: Diagnosis not present

## 2021-02-03 DIAGNOSIS — I5032 Chronic diastolic (congestive) heart failure: Secondary | ICD-10-CM | POA: Diagnosis not present

## 2021-02-03 DIAGNOSIS — M069 Rheumatoid arthritis, unspecified: Secondary | ICD-10-CM | POA: Diagnosis not present

## 2021-02-03 DIAGNOSIS — I480 Paroxysmal atrial fibrillation: Secondary | ICD-10-CM

## 2021-02-04 ENCOUNTER — Other Ambulatory Visit: Payer: Self-pay | Admitting: Internal Medicine

## 2021-02-04 DIAGNOSIS — I130 Hypertensive heart and chronic kidney disease with heart failure and stage 1 through stage 4 chronic kidney disease, or unspecified chronic kidney disease: Secondary | ICD-10-CM | POA: Diagnosis not present

## 2021-02-04 DIAGNOSIS — Z471 Aftercare following joint replacement surgery: Secondary | ICD-10-CM | POA: Diagnosis not present

## 2021-02-04 DIAGNOSIS — M069 Rheumatoid arthritis, unspecified: Secondary | ICD-10-CM | POA: Diagnosis not present

## 2021-02-04 DIAGNOSIS — Z96611 Presence of right artificial shoulder joint: Secondary | ICD-10-CM | POA: Diagnosis not present

## 2021-02-04 DIAGNOSIS — I5032 Chronic diastolic (congestive) heart failure: Secondary | ICD-10-CM | POA: Diagnosis not present

## 2021-02-04 DIAGNOSIS — N1831 Chronic kidney disease, stage 3a: Secondary | ICD-10-CM | POA: Diagnosis not present

## 2021-02-04 DIAGNOSIS — I480 Paroxysmal atrial fibrillation: Secondary | ICD-10-CM

## 2021-02-07 ENCOUNTER — Telehealth: Payer: Medicare Other

## 2021-02-07 ENCOUNTER — Other Ambulatory Visit: Payer: Self-pay | Admitting: Internal Medicine

## 2021-02-07 DIAGNOSIS — M069 Rheumatoid arthritis, unspecified: Secondary | ICD-10-CM | POA: Diagnosis not present

## 2021-02-07 DIAGNOSIS — I5032 Chronic diastolic (congestive) heart failure: Secondary | ICD-10-CM | POA: Diagnosis not present

## 2021-02-07 DIAGNOSIS — I130 Hypertensive heart and chronic kidney disease with heart failure and stage 1 through stage 4 chronic kidney disease, or unspecified chronic kidney disease: Secondary | ICD-10-CM | POA: Diagnosis not present

## 2021-02-07 DIAGNOSIS — Z471 Aftercare following joint replacement surgery: Secondary | ICD-10-CM | POA: Diagnosis not present

## 2021-02-07 DIAGNOSIS — N1831 Chronic kidney disease, stage 3a: Secondary | ICD-10-CM | POA: Diagnosis not present

## 2021-02-07 DIAGNOSIS — Z96611 Presence of right artificial shoulder joint: Secondary | ICD-10-CM | POA: Diagnosis not present

## 2021-02-07 DIAGNOSIS — I10 Essential (primary) hypertension: Secondary | ICD-10-CM

## 2021-02-08 ENCOUNTER — Telehealth: Payer: Medicare Other

## 2021-02-11 ENCOUNTER — Other Ambulatory Visit: Payer: Self-pay | Admitting: Internal Medicine

## 2021-02-11 DIAGNOSIS — I5032 Chronic diastolic (congestive) heart failure: Secondary | ICD-10-CM | POA: Diagnosis not present

## 2021-02-11 DIAGNOSIS — I130 Hypertensive heart and chronic kidney disease with heart failure and stage 1 through stage 4 chronic kidney disease, or unspecified chronic kidney disease: Secondary | ICD-10-CM | POA: Diagnosis not present

## 2021-02-11 DIAGNOSIS — M069 Rheumatoid arthritis, unspecified: Secondary | ICD-10-CM | POA: Diagnosis not present

## 2021-02-11 DIAGNOSIS — Z96611 Presence of right artificial shoulder joint: Secondary | ICD-10-CM | POA: Diagnosis not present

## 2021-02-11 DIAGNOSIS — Z471 Aftercare following joint replacement surgery: Secondary | ICD-10-CM | POA: Diagnosis not present

## 2021-02-11 DIAGNOSIS — N1831 Chronic kidney disease, stage 3a: Secondary | ICD-10-CM | POA: Diagnosis not present

## 2021-02-11 NOTE — Telephone Encounter (Signed)
Can you close it please

## 2021-02-14 DIAGNOSIS — N1831 Chronic kidney disease, stage 3a: Secondary | ICD-10-CM | POA: Diagnosis not present

## 2021-02-14 DIAGNOSIS — M069 Rheumatoid arthritis, unspecified: Secondary | ICD-10-CM | POA: Diagnosis not present

## 2021-02-14 DIAGNOSIS — Z471 Aftercare following joint replacement surgery: Secondary | ICD-10-CM | POA: Diagnosis not present

## 2021-02-14 DIAGNOSIS — I130 Hypertensive heart and chronic kidney disease with heart failure and stage 1 through stage 4 chronic kidney disease, or unspecified chronic kidney disease: Secondary | ICD-10-CM | POA: Diagnosis not present

## 2021-02-14 DIAGNOSIS — I5032 Chronic diastolic (congestive) heart failure: Secondary | ICD-10-CM | POA: Diagnosis not present

## 2021-02-14 DIAGNOSIS — Z96611 Presence of right artificial shoulder joint: Secondary | ICD-10-CM | POA: Diagnosis not present

## 2021-02-17 DIAGNOSIS — I130 Hypertensive heart and chronic kidney disease with heart failure and stage 1 through stage 4 chronic kidney disease, or unspecified chronic kidney disease: Secondary | ICD-10-CM | POA: Diagnosis not present

## 2021-02-17 DIAGNOSIS — J189 Pneumonia, unspecified organism: Secondary | ICD-10-CM | POA: Diagnosis not present

## 2021-02-17 DIAGNOSIS — E785 Hyperlipidemia, unspecified: Secondary | ICD-10-CM | POA: Diagnosis not present

## 2021-02-17 DIAGNOSIS — Z79899 Other long term (current) drug therapy: Secondary | ICD-10-CM | POA: Diagnosis not present

## 2021-02-17 DIAGNOSIS — Z471 Aftercare following joint replacement surgery: Secondary | ICD-10-CM | POA: Diagnosis not present

## 2021-02-17 DIAGNOSIS — Z6841 Body Mass Index (BMI) 40.0 and over, adult: Secondary | ICD-10-CM | POA: Diagnosis not present

## 2021-02-17 DIAGNOSIS — Z7901 Long term (current) use of anticoagulants: Secondary | ICD-10-CM | POA: Diagnosis not present

## 2021-02-17 DIAGNOSIS — I25119 Atherosclerotic heart disease of native coronary artery with unspecified angina pectoris: Secondary | ICD-10-CM | POA: Diagnosis not present

## 2021-02-17 DIAGNOSIS — G43909 Migraine, unspecified, not intractable, without status migrainosus: Secondary | ICD-10-CM | POA: Diagnosis not present

## 2021-02-17 DIAGNOSIS — I480 Paroxysmal atrial fibrillation: Secondary | ICD-10-CM | POA: Diagnosis not present

## 2021-02-17 DIAGNOSIS — E876 Hypokalemia: Secondary | ICD-10-CM | POA: Diagnosis not present

## 2021-02-17 DIAGNOSIS — I5032 Chronic diastolic (congestive) heart failure: Secondary | ICD-10-CM | POA: Diagnosis not present

## 2021-02-17 DIAGNOSIS — Z7952 Long term (current) use of systemic steroids: Secondary | ICD-10-CM | POA: Diagnosis not present

## 2021-02-17 DIAGNOSIS — G4730 Sleep apnea, unspecified: Secondary | ICD-10-CM | POA: Diagnosis not present

## 2021-02-17 DIAGNOSIS — M069 Rheumatoid arthritis, unspecified: Secondary | ICD-10-CM | POA: Diagnosis not present

## 2021-02-17 DIAGNOSIS — N1831 Chronic kidney disease, stage 3a: Secondary | ICD-10-CM | POA: Diagnosis not present

## 2021-02-17 DIAGNOSIS — J440 Chronic obstructive pulmonary disease with acute lower respiratory infection: Secondary | ICD-10-CM | POA: Diagnosis not present

## 2021-02-17 DIAGNOSIS — J45909 Unspecified asthma, uncomplicated: Secondary | ICD-10-CM | POA: Diagnosis not present

## 2021-02-17 DIAGNOSIS — G894 Chronic pain syndrome: Secondary | ICD-10-CM | POA: Diagnosis not present

## 2021-02-17 DIAGNOSIS — Z96611 Presence of right artificial shoulder joint: Secondary | ICD-10-CM | POA: Diagnosis not present

## 2021-02-18 DIAGNOSIS — Z96611 Presence of right artificial shoulder joint: Secondary | ICD-10-CM | POA: Diagnosis not present

## 2021-02-18 DIAGNOSIS — Z471 Aftercare following joint replacement surgery: Secondary | ICD-10-CM | POA: Diagnosis not present

## 2021-02-18 DIAGNOSIS — I130 Hypertensive heart and chronic kidney disease with heart failure and stage 1 through stage 4 chronic kidney disease, or unspecified chronic kidney disease: Secondary | ICD-10-CM | POA: Diagnosis not present

## 2021-02-18 DIAGNOSIS — I5032 Chronic diastolic (congestive) heart failure: Secondary | ICD-10-CM | POA: Diagnosis not present

## 2021-02-18 DIAGNOSIS — M069 Rheumatoid arthritis, unspecified: Secondary | ICD-10-CM | POA: Diagnosis not present

## 2021-02-18 DIAGNOSIS — N1831 Chronic kidney disease, stage 3a: Secondary | ICD-10-CM | POA: Diagnosis not present

## 2021-02-21 DIAGNOSIS — Z471 Aftercare following joint replacement surgery: Secondary | ICD-10-CM | POA: Diagnosis not present

## 2021-02-21 DIAGNOSIS — N1831 Chronic kidney disease, stage 3a: Secondary | ICD-10-CM | POA: Diagnosis not present

## 2021-02-21 DIAGNOSIS — I130 Hypertensive heart and chronic kidney disease with heart failure and stage 1 through stage 4 chronic kidney disease, or unspecified chronic kidney disease: Secondary | ICD-10-CM | POA: Diagnosis not present

## 2021-02-21 DIAGNOSIS — Z96611 Presence of right artificial shoulder joint: Secondary | ICD-10-CM | POA: Diagnosis not present

## 2021-02-21 DIAGNOSIS — I5032 Chronic diastolic (congestive) heart failure: Secondary | ICD-10-CM | POA: Diagnosis not present

## 2021-02-21 DIAGNOSIS — M069 Rheumatoid arthritis, unspecified: Secondary | ICD-10-CM | POA: Diagnosis not present

## 2021-02-22 ENCOUNTER — Other Ambulatory Visit: Payer: Self-pay | Admitting: Internal Medicine

## 2021-02-22 DIAGNOSIS — G894 Chronic pain syndrome: Secondary | ICD-10-CM

## 2021-02-22 MED ORDER — OXYCODONE HCL 20 MG PO TABS: 1.0000 | ORAL_TABLET | Freq: Two times a day (BID) | ORAL | 0 refills | Status: DC

## 2021-02-22 NOTE — Telephone Encounter (Signed)
Last OV -01/11/2021 Next OV -N/A Last Filled - 01/24/2021

## 2021-02-22 NOTE — Telephone Encounter (Signed)
°  Encourage patient to contact the pharmacy for refills or they can request refills through [place]:  Please schedule appointment if longer than 1 year  NEXT APPOINTMENT DATE:04/07/21  MEDICATION:Oxycodone HCl 20 MG TABS  Is the patient out of medication? yes  PHARMACY:[id number] - [place], [place]  Let patient know to contact pharmacy at the end of the day to make sure medication is ready.  Please notify patient to allow 48-72 hours to process  CLINICAL FILLS OUT ALL BELOW:   LAST REFILL:  QTY:  REFILL DATE:    OTHER COMMENTS:    Okay for refill?  Please advise

## 2021-02-25 DIAGNOSIS — N1831 Chronic kidney disease, stage 3a: Secondary | ICD-10-CM | POA: Diagnosis not present

## 2021-02-25 DIAGNOSIS — I130 Hypertensive heart and chronic kidney disease with heart failure and stage 1 through stage 4 chronic kidney disease, or unspecified chronic kidney disease: Secondary | ICD-10-CM | POA: Diagnosis not present

## 2021-02-25 DIAGNOSIS — Z96611 Presence of right artificial shoulder joint: Secondary | ICD-10-CM | POA: Diagnosis not present

## 2021-02-25 DIAGNOSIS — M069 Rheumatoid arthritis, unspecified: Secondary | ICD-10-CM | POA: Diagnosis not present

## 2021-02-25 DIAGNOSIS — I5032 Chronic diastolic (congestive) heart failure: Secondary | ICD-10-CM | POA: Diagnosis not present

## 2021-02-25 DIAGNOSIS — Z471 Aftercare following joint replacement surgery: Secondary | ICD-10-CM | POA: Diagnosis not present

## 2021-02-28 DIAGNOSIS — N1831 Chronic kidney disease, stage 3a: Secondary | ICD-10-CM | POA: Diagnosis not present

## 2021-02-28 DIAGNOSIS — Z471 Aftercare following joint replacement surgery: Secondary | ICD-10-CM | POA: Diagnosis not present

## 2021-02-28 DIAGNOSIS — I5032 Chronic diastolic (congestive) heart failure: Secondary | ICD-10-CM | POA: Diagnosis not present

## 2021-02-28 DIAGNOSIS — M069 Rheumatoid arthritis, unspecified: Secondary | ICD-10-CM | POA: Diagnosis not present

## 2021-02-28 DIAGNOSIS — Z96611 Presence of right artificial shoulder joint: Secondary | ICD-10-CM | POA: Diagnosis not present

## 2021-02-28 DIAGNOSIS — I130 Hypertensive heart and chronic kidney disease with heart failure and stage 1 through stage 4 chronic kidney disease, or unspecified chronic kidney disease: Secondary | ICD-10-CM | POA: Diagnosis not present

## 2021-03-03 DIAGNOSIS — Z471 Aftercare following joint replacement surgery: Secondary | ICD-10-CM | POA: Diagnosis not present

## 2021-03-03 DIAGNOSIS — Z96611 Presence of right artificial shoulder joint: Secondary | ICD-10-CM | POA: Diagnosis not present

## 2021-03-03 DIAGNOSIS — I5032 Chronic diastolic (congestive) heart failure: Secondary | ICD-10-CM | POA: Diagnosis not present

## 2021-03-03 DIAGNOSIS — M069 Rheumatoid arthritis, unspecified: Secondary | ICD-10-CM | POA: Diagnosis not present

## 2021-03-03 DIAGNOSIS — I130 Hypertensive heart and chronic kidney disease with heart failure and stage 1 through stage 4 chronic kidney disease, or unspecified chronic kidney disease: Secondary | ICD-10-CM | POA: Diagnosis not present

## 2021-03-03 DIAGNOSIS — N1831 Chronic kidney disease, stage 3a: Secondary | ICD-10-CM | POA: Diagnosis not present

## 2021-03-07 DIAGNOSIS — I5032 Chronic diastolic (congestive) heart failure: Secondary | ICD-10-CM | POA: Diagnosis not present

## 2021-03-07 DIAGNOSIS — M069 Rheumatoid arthritis, unspecified: Secondary | ICD-10-CM | POA: Diagnosis not present

## 2021-03-07 DIAGNOSIS — Z471 Aftercare following joint replacement surgery: Secondary | ICD-10-CM | POA: Diagnosis not present

## 2021-03-07 DIAGNOSIS — Z96611 Presence of right artificial shoulder joint: Secondary | ICD-10-CM | POA: Diagnosis not present

## 2021-03-07 DIAGNOSIS — I130 Hypertensive heart and chronic kidney disease with heart failure and stage 1 through stage 4 chronic kidney disease, or unspecified chronic kidney disease: Secondary | ICD-10-CM | POA: Diagnosis not present

## 2021-03-07 DIAGNOSIS — N1831 Chronic kidney disease, stage 3a: Secondary | ICD-10-CM | POA: Diagnosis not present

## 2021-03-14 DIAGNOSIS — M069 Rheumatoid arthritis, unspecified: Secondary | ICD-10-CM | POA: Diagnosis not present

## 2021-03-14 DIAGNOSIS — N1831 Chronic kidney disease, stage 3a: Secondary | ICD-10-CM | POA: Diagnosis not present

## 2021-03-14 DIAGNOSIS — Z96611 Presence of right artificial shoulder joint: Secondary | ICD-10-CM | POA: Diagnosis not present

## 2021-03-14 DIAGNOSIS — Z471 Aftercare following joint replacement surgery: Secondary | ICD-10-CM | POA: Diagnosis not present

## 2021-03-14 DIAGNOSIS — I5032 Chronic diastolic (congestive) heart failure: Secondary | ICD-10-CM | POA: Diagnosis not present

## 2021-03-14 DIAGNOSIS — I130 Hypertensive heart and chronic kidney disease with heart failure and stage 1 through stage 4 chronic kidney disease, or unspecified chronic kidney disease: Secondary | ICD-10-CM | POA: Diagnosis not present

## 2021-03-19 DIAGNOSIS — J440 Chronic obstructive pulmonary disease with acute lower respiratory infection: Secondary | ICD-10-CM | POA: Diagnosis not present

## 2021-03-19 DIAGNOSIS — Z6841 Body Mass Index (BMI) 40.0 and over, adult: Secondary | ICD-10-CM | POA: Diagnosis not present

## 2021-03-19 DIAGNOSIS — I480 Paroxysmal atrial fibrillation: Secondary | ICD-10-CM | POA: Diagnosis not present

## 2021-03-19 DIAGNOSIS — G4730 Sleep apnea, unspecified: Secondary | ICD-10-CM | POA: Diagnosis not present

## 2021-03-19 DIAGNOSIS — J189 Pneumonia, unspecified organism: Secondary | ICD-10-CM | POA: Diagnosis not present

## 2021-03-19 DIAGNOSIS — E785 Hyperlipidemia, unspecified: Secondary | ICD-10-CM | POA: Diagnosis not present

## 2021-03-19 DIAGNOSIS — G894 Chronic pain syndrome: Secondary | ICD-10-CM | POA: Diagnosis not present

## 2021-03-19 DIAGNOSIS — Z79899 Other long term (current) drug therapy: Secondary | ICD-10-CM | POA: Diagnosis not present

## 2021-03-19 DIAGNOSIS — G43909 Migraine, unspecified, not intractable, without status migrainosus: Secondary | ICD-10-CM | POA: Diagnosis not present

## 2021-03-19 DIAGNOSIS — N1831 Chronic kidney disease, stage 3a: Secondary | ICD-10-CM | POA: Diagnosis not present

## 2021-03-19 DIAGNOSIS — Z96611 Presence of right artificial shoulder joint: Secondary | ICD-10-CM | POA: Diagnosis not present

## 2021-03-19 DIAGNOSIS — I130 Hypertensive heart and chronic kidney disease with heart failure and stage 1 through stage 4 chronic kidney disease, or unspecified chronic kidney disease: Secondary | ICD-10-CM | POA: Diagnosis not present

## 2021-03-19 DIAGNOSIS — Z471 Aftercare following joint replacement surgery: Secondary | ICD-10-CM | POA: Diagnosis not present

## 2021-03-19 DIAGNOSIS — Z7901 Long term (current) use of anticoagulants: Secondary | ICD-10-CM | POA: Diagnosis not present

## 2021-03-19 DIAGNOSIS — Z7952 Long term (current) use of systemic steroids: Secondary | ICD-10-CM | POA: Diagnosis not present

## 2021-03-19 DIAGNOSIS — I5032 Chronic diastolic (congestive) heart failure: Secondary | ICD-10-CM | POA: Diagnosis not present

## 2021-03-19 DIAGNOSIS — I25119 Atherosclerotic heart disease of native coronary artery with unspecified angina pectoris: Secondary | ICD-10-CM | POA: Diagnosis not present

## 2021-03-19 DIAGNOSIS — E876 Hypokalemia: Secondary | ICD-10-CM | POA: Diagnosis not present

## 2021-03-19 DIAGNOSIS — J45909 Unspecified asthma, uncomplicated: Secondary | ICD-10-CM | POA: Diagnosis not present

## 2021-03-19 DIAGNOSIS — M069 Rheumatoid arthritis, unspecified: Secondary | ICD-10-CM | POA: Diagnosis not present

## 2021-03-21 DIAGNOSIS — I5032 Chronic diastolic (congestive) heart failure: Secondary | ICD-10-CM | POA: Diagnosis not present

## 2021-03-21 DIAGNOSIS — Z471 Aftercare following joint replacement surgery: Secondary | ICD-10-CM | POA: Diagnosis not present

## 2021-03-21 DIAGNOSIS — N1831 Chronic kidney disease, stage 3a: Secondary | ICD-10-CM | POA: Diagnosis not present

## 2021-03-21 DIAGNOSIS — I130 Hypertensive heart and chronic kidney disease with heart failure and stage 1 through stage 4 chronic kidney disease, or unspecified chronic kidney disease: Secondary | ICD-10-CM | POA: Diagnosis not present

## 2021-03-21 DIAGNOSIS — Z96611 Presence of right artificial shoulder joint: Secondary | ICD-10-CM | POA: Diagnosis not present

## 2021-03-21 DIAGNOSIS — M069 Rheumatoid arthritis, unspecified: Secondary | ICD-10-CM | POA: Diagnosis not present

## 2021-03-22 ENCOUNTER — Other Ambulatory Visit: Payer: Self-pay | Admitting: Internal Medicine

## 2021-03-22 DIAGNOSIS — G894 Chronic pain syndrome: Secondary | ICD-10-CM

## 2021-03-22 MED ORDER — OXYCODONE HCL 20 MG PO TABS: 1.0000 | ORAL_TABLET | Freq: Two times a day (BID) | ORAL | 0 refills | Status: DC

## 2021-03-22 NOTE — Telephone Encounter (Signed)
Name of Medication: Oxycodone Name of Pharmacy: [facility] [address] or Written Date and Quantity: 02-22-21 #60 Last Office Visit and Type: 01-11-21 Next Office Visit and Type: 03-23-21 Last Controlled Substance Agreement Date: 10-12-20 Last UDS: 10-12-20

## 2021-03-22 NOTE — Telephone Encounter (Signed)
Patient called requesting the following, please:  1.Medication Requested: Oxycodone HCI 20 mg # 60  2. Pharmacy (Name, Street, [place]): [id number] - [place], [place] - [address]  [address] [place], [place] [address]   3. On Med List: Yes  4. Last Visit with PCP:  01/11/21  5. Next visit date with PCP:  03/23/21   Agent: Please be advised that RX refills may take up to 3 business days. We ask that you follow-up with your pharmacy.

## 2021-03-23 ENCOUNTER — Other Ambulatory Visit: Payer: Self-pay

## 2021-03-23 ENCOUNTER — Encounter: Payer: Self-pay | Admitting: Internal Medicine

## 2021-03-23 ENCOUNTER — Ambulatory Visit (INDEPENDENT_AMBULATORY_CARE_PROVIDER_SITE_OTHER): Payer: Medicare Other | Admitting: Internal Medicine

## 2021-03-23 DIAGNOSIS — R110 Nausea: Secondary | ICD-10-CM

## 2021-03-23 DIAGNOSIS — R251 Tremor, unspecified: Secondary | ICD-10-CM | POA: Diagnosis not present

## 2021-03-23 DIAGNOSIS — G479 Sleep disorder, unspecified: Secondary | ICD-10-CM | POA: Insufficient documentation

## 2021-03-23 DIAGNOSIS — F1120 Opioid dependence, uncomplicated: Secondary | ICD-10-CM | POA: Diagnosis not present

## 2021-03-23 NOTE — Assessment & Plan Note (Signed)
Discussed my concern with her polypharmacy Will have her change gabapentin to 600mg  at bedtime--or even 900mg  (and not take during the day) No new meds

## 2021-03-23 NOTE — Assessment & Plan Note (Signed)
Vague lingering symptoms with nausea, anorexia No worrisome symptoms except the weight loss Now able to eat yesterday May be self limited GI bug----observe only for now

## 2021-03-23 NOTE — Progress Notes (Signed)
Subjective:    Patient ID: Tina Pacheco, female    DOB: 1951-02-20, 71 y.o.   MRN: [id number]  HPI Here with husband due to illness  Started about 2 weeks ago---trouble sleeping Then lost appetite  Drinking okay Fairly constant nausea--but no vomiting ("just foam" with retching) Still having sleep problems Did eat some soup yesterday--then chicken and rice Feels shaky  Feels "things moving around" in stomach Not overtly painful No cough or SOB  Had bad loose stool--black-- 4 days ago No fever Husband not sick  Current Outpatient Medications on File Prior to Visit  Medication Sig Dispense Refill   amiodarone (PACERONE) 200 MG tablet Take 1 tablet (200 mg total) by mouth at bedtime. GIVE ONE TABLET BY AT BEDTIME 30 tablet 0   apixaban (ELIQUIS) 5 MG TABS tablet TAKE 1 TABLET(5 MG) BY MOUTH TWICE DAILY 60 tablet 0   atorvastatin (LIPITOR) 40 MG tablet Take 1 tablet (40 mg total) by mouth daily. 30 tablet 0   bisacodyl (DULCOLAX) 10 MG suppository Place 10 mg rectally as needed for moderate constipation.     gabapentin (NEURONTIN) 300 MG capsule Take 1 capsule (300 mg total) by mouth at bedtime. 90 capsule 3   ketoconazole (NIZORAL) 2 % cream APPLY A SMALL AMOUNT TO AFFECTED AREA ONCE A DAY TO RASH ON BUTTOCKS, GROIN 60 g 1   leflunomide (ARAVA) 20 MG tablet Take 1 tablet (20 mg total) by mouth daily. 30 tablet 0   Magnesium Hydroxide (MILK OF MAGNESIA PO) Take by mouth as needed.     meclizine (ANTIVERT) 25 MG tablet TAKE 1 TABLET(25 MG) BY MOUTH THREE TIMES DAILY AS NEEDED FOR DIZZINESS 90 tablet 5   metolazone (ZAROXOLYN) 2.5 MG tablet Take 1 tablet (2.5 mg total) by mouth daily as needed. Give 1 tablet PRN, daily by mouth for weight gain of 5lbs in a week. 30 tablet 0   metoprolol succinate (TOPROL-XL) 50 MG 24 hr tablet TAKE 1 TABLET BY MOUTH EVERY DAY WITH OR IMMEDIATELY FOLLOWING A MEAL 90 tablet 3   montelukast (SINGULAIR) 10 MG tablet TAKE 1 TABLET BY MOUTH AT BEDTIME 90  tablet 3   nitroGLYCERIN (NITROSTAT) 0.4 MG SL tablet Place 1 tablet (0.4 mg total) under the tongue every 5 (five) minutes as needed for chest pain. 20 tablet 12   NON FORMULARY Diet:Heart healthy     nystatin (MYCOSTATIN/NYSTOP) powder Apply 1 application topically 3 (three) times daily as needed. 60 g 0   Oxycodone HCl 20 MG TABS Take 1 tablet (20 mg total) by mouth in the morning and at bedtime. 60 tablet 0   polyethylene glycol (MIRALAX / GLYCOLAX) packet Take 17 g by mouth daily as needed for mild constipation. 14 each 0   Potassium Chloride ER 20 MEQ TBCR Take 20 mEq by mouth daily at 12 noon. 30 tablet 0   predniSONE (DELTASONE) 5 MG tablet Take 5 mg by mouth daily with breakfast.     SF 5000 PLUS 1.1 % CREA dental cream Place 1 application onto teeth 2 (two) times daily.  1   Sodium Phosphates (RA SALINE ENEMA RE) Place rectally as needed.     spironolactone (ALDACTONE) 25 MG tablet TAKE 1 TABLET(25 MG) BY MOUTH DAILY 30 tablet 0   terazosin (HYTRIN) 10 MG capsule TAKE 1 CAPSULE(10 MG) BY MOUTH AT BEDTIME 90 capsule 3   torsemide (DEMADEX) 20 MG tablet Take 2 tablets (40 mg total) by mouth daily. Give two tablets (40 mg)  by mouth daily for CHF 60 tablet 0   No current facility-administered medications on file prior to visit.    Allergies  Allergen Reactions   Fluoxetine Other (See Comments)    Headache, shaking, sleep issues Headache, shaking, sleep issues   Codeine     Nausea and vomiting/only when taking too much    Past Medical History:  Diagnosis Date   Arthritis    RA   Asthma    Basal cell carcinoma    Cataract 2018   bilateral eyes; corrected with surgery   Claustrophobia    Collagen vascular disease (HCC)    RA   COPD (chronic obstructive pulmonary disease) ([place])    Diastolic dysfunction    a. echo 07/2014: EF 55-60%, no RWMA, GR2DD, mild MR, LA moderately dilated, PASP 38 mm Hg   Dyslipidemia    Headache    migraines   Hemihypertrophy    History of cardiac  cath    a. cardiac cath 05/24/2010 - nonobstructive CAD   History of gout    Hyperplastic colonic polyp 2003   Hypertension    Hypokalemia    Morbid obesity ([place])    PAF (paroxysmal atrial fibrillation) ([place])    a. on Pradaxa; b. CHADSVASc at least 2 (HTN & female)   Rheumatoid arthritis(714.0)    Sleep apnea    a. not compliant with CPAP    Past Surgical History:  Procedure Laterality Date   ABDOMINAL HYSTERECTOMY     APPLICATION OF WOUND VAC Right 08/09/2017   Procedure: APPLICATION OF WOUND VAC;  Surgeon: Albertine Patricia, DPM;  Location: [facility];  Service: Podiatry;  Laterality: Right;   CARDIAC CATHETERIZATION  05/24/2010   nonobstructive CAD   CATARACT EXTRACTION W/ INTRAOCULAR LENS IMPLANT Right 06/12/2016   Dr. Darleen Crocker   CATARACT EXTRACTION W/ INTRAOCULAR LENS IMPLANT Left 06/26/2016   Dr. Darleen Crocker   CESAREAN SECTION     CHOLECYSTECTOMY     COLONOSCOPY  06/12/2011   Procedure: COLONOSCOPY;  Surgeon: Juanita Craver, MD;  Location: [facility];  Service: Endoscopy;  Laterality: N/A;   COLONOSCOPY N/A 03/17/2013   Procedure: COLONOSCOPY;  Surgeon: Juanita Craver, MD;  Location: [facility];  Service: Endoscopy;  Laterality: N/A;   EYE SURGERY     FOOT ARTHRODESIS Right 07/13/2017   Procedure: ARTHRODESIS FOOT-MULTI.FUSIONS (6 JOINTS);  Surgeon: Albertine Patricia, DPM;  Location: [facility];  Service: Podiatry;  Laterality: Right;   IRRIGATION AND DEBRIDEMENT FOOT Right 08/09/2017   Procedure: IRRIGATION AND DEBRIDEMENT FOOT;  Surgeon: Albertine Patricia, DPM;  Location: [facility];  Service: Podiatry;  Laterality: Right;   KNEE ARTHROSCOPY     bilateral   LEFT HEART CATH AND CORONARY ANGIOGRAPHY N/A 06/04/2020   Procedure: LEFT HEART CATH AND CORONARY ANGIOGRAPHY;  Surgeon: Wellington Hampshire, MD;  Location: [facility];  Service: Cardiovascular;  Laterality: N/A;   REVERSE SHOULDER ARTHROPLASTY Right 11/16/2020   Procedure: REVERSE SHOULDER ARTHROPLASTY;  Surgeon: Corky Mull, MD;  Location: [facility];  Service: Orthopedics;  Laterality: Right;   SHOULDER CLOSED REDUCTION Right 11/11/2020   Procedure: CLOSED REDUCTION SHOULDER;  Surgeon: Corky Mull, MD;  Location: [facility];  Service: Orthopedics;  Laterality: Right;   SHOULDER INJECTION Right 11/11/2020   Procedure: SHOULDER INJECTION;  Surgeon: Corky Mull, MD;  Location: [facility];  Service: Orthopedics;  Laterality: Right;   SINUSOTOMY     TOOTH EXTRACTION  12/2016   VAGINAL HYSTERECTOMY      Family History  Problem Relation Age of Onset   Emphysema Father        smoked   Arthritis Father    Tuberculosis Mother    Parkinsonism Mother    Cancer Sister    Diabetes type II Sister    Breast cancer Sister    Breast cancer Maternal Aunt    Tuberculosis Sister     Social History   Socioeconomic History   Marital status: Married    Spouse name: Not on file   Number of children: 1   Years of education: Not on file   Highest education level: Not on file  Occupational History   Occupation: Taxpayer service    Employer: [employer]    Comment: Retired 2007  Tobacco Use   Smoking status: Never   Smokeless tobacco: Never  Vaping Use   Vaping Use: Never used  Substance and Sexual Activity   Alcohol use: No   Drug use: No   Sexual activity: Not Currently  Other Topics Concern   Not on file  Social History Narrative   No living will   Husband, then daughter should be decision maker   Would accept resuscitation attempts   Not sure about tube feeds   Social Determinants of Health   Financial Resource Strain: Not on file  Food Insecurity: No Food Insecurity   Worried About Charity fundraiser in the Last Year: Never true   [place] in the Last Year: Never true  Transportation Needs: No Transportation Needs   Lack of Transportation (Medical): No   Lack of Transportation (Non-Medical): No  Physical Activity: Not on file  Stress: Not on file  Social Connections: Not on file  Intimate  Partner Violence: Not on file   Review of Systems No change in meds Has lost a few pounds in the past week Has some shaking in right hand--worried due to mom's Parkinsons    Objective:   Physical Exam Constitutional:      Appearance: Normal appearance.  Cardiovascular:     Rate and Rhythm: Normal rate and regular rhythm.     Heart sounds: No murmur heard.   No gallop.  Pulmonary:     Effort: Pulmonary effort is normal.     Breath sounds: Normal breath sounds. No wheezing or rales.  Abdominal:     General: Bowel sounds are normal.     Palpations: Abdomen is soft.     Tenderness: There is no abdominal tenderness. There is no guarding or rebound.  Musculoskeletal:     Cervical back: Neck supple.  Lymphadenopathy:     Cervical: No cervical adenopathy.  Neurological:     Mental Status: She is alert.           Assessment & Plan:

## 2021-03-23 NOTE — Assessment & Plan Note (Signed)
Isolated to right arm at this point No clear stiffness or bradykinesia Doesn't walk much Discussed--likely not Parkinson's  Will revisit this at upcoming visits

## 2021-03-23 NOTE — Patient Instructions (Signed)
Please try taking 2 of the 300mg  gabapentin at bedtime (or 1 hour before). You can even increase it to 3 capsules (900mg ) at bedtime. Don't take any during the day.

## 2021-03-23 NOTE — Assessment & Plan Note (Signed)
PDMP reviewed No concerns 

## 2021-03-25 ENCOUNTER — Telehealth: Payer: Self-pay | Admitting: Internal Medicine

## 2021-03-25 MED ORDER — ONDANSETRON 4 MG PO TBDP: 4.0000 mg | ORAL_TABLET | Freq: Three times a day (TID) | ORAL | 1 refills | Status: DC | PRN

## 2021-03-25 NOTE — Telephone Encounter (Signed)
Please let her know that I sent a prescription for [facility] to her [facility]

## 2021-03-25 NOTE — Telephone Encounter (Signed)
Spoke to pt. She said the pharmacy told her they would have it ready by 3pm.

## 2021-03-25 NOTE — Telephone Encounter (Signed)
Pt called stating that she had an OV on 03/23/21. Pt states that she is still having the nausea and is not feeling any better. Pt would like something called in. Please advise.

## 2021-03-26 ENCOUNTER — Other Ambulatory Visit: Payer: Self-pay | Admitting: Adult Health

## 2021-03-26 DIAGNOSIS — I5032 Chronic diastolic (congestive) heart failure: Secondary | ICD-10-CM

## 2021-03-28 ENCOUNTER — Other Ambulatory Visit: Payer: Self-pay | Admitting: Internal Medicine

## 2021-03-28 DIAGNOSIS — Z471 Aftercare following joint replacement surgery: Secondary | ICD-10-CM | POA: Diagnosis not present

## 2021-03-28 DIAGNOSIS — N1831 Chronic kidney disease, stage 3a: Secondary | ICD-10-CM | POA: Diagnosis not present

## 2021-03-28 DIAGNOSIS — I5032 Chronic diastolic (congestive) heart failure: Secondary | ICD-10-CM

## 2021-03-28 DIAGNOSIS — M069 Rheumatoid arthritis, unspecified: Secondary | ICD-10-CM | POA: Diagnosis not present

## 2021-03-28 DIAGNOSIS — I130 Hypertensive heart and chronic kidney disease with heart failure and stage 1 through stage 4 chronic kidney disease, or unspecified chronic kidney disease: Secondary | ICD-10-CM | POA: Diagnosis not present

## 2021-03-28 DIAGNOSIS — Z96611 Presence of right artificial shoulder joint: Secondary | ICD-10-CM | POA: Diagnosis not present

## 2021-03-29 NOTE — Telephone Encounter (Signed)
Will send to Dr Silvio Pate as to how much she needs.

## 2021-04-01 ENCOUNTER — Telehealth: Payer: Self-pay | Admitting: Internal Medicine

## 2021-04-01 NOTE — Telephone Encounter (Signed)
Mrs. Bridwell called in and wanted to know about the referral for gastroenterology and didn't know if anyone called to schedule.

## 2021-04-04 ENCOUNTER — Telehealth: Payer: Self-pay

## 2021-04-04 DIAGNOSIS — R110 Nausea: Secondary | ICD-10-CM

## 2021-04-04 NOTE — Telephone Encounter (Signed)
Patient called in wanted to follow up on her referral she has requested to GI. States that her abdominal pain and symptoms have been going on for a few weeks now and she has not had any improvement. Wanted to now when she could get appointment to GI. Do not see were referral was started.

## 2021-04-04 NOTE — Addendum Note (Signed)
Addended by: Viviana Simpler I on: 04/04/2021 04:06 PM   Modules accepted: Orders

## 2021-04-04 NOTE — Telephone Encounter (Signed)
Spoke to pt. She will wait to hear from them.

## 2021-04-05 ENCOUNTER — Encounter: Payer: Self-pay | Admitting: *Deleted

## 2021-04-06 DIAGNOSIS — M069 Rheumatoid arthritis, unspecified: Secondary | ICD-10-CM | POA: Diagnosis not present

## 2021-04-06 DIAGNOSIS — I5032 Chronic diastolic (congestive) heart failure: Secondary | ICD-10-CM | POA: Diagnosis not present

## 2021-04-06 DIAGNOSIS — Z96611 Presence of right artificial shoulder joint: Secondary | ICD-10-CM | POA: Diagnosis not present

## 2021-04-06 DIAGNOSIS — Z471 Aftercare following joint replacement surgery: Secondary | ICD-10-CM | POA: Diagnosis not present

## 2021-04-06 DIAGNOSIS — I130 Hypertensive heart and chronic kidney disease with heart failure and stage 1 through stage 4 chronic kidney disease, or unspecified chronic kidney disease: Secondary | ICD-10-CM | POA: Diagnosis not present

## 2021-04-06 DIAGNOSIS — N1831 Chronic kidney disease, stage 3a: Secondary | ICD-10-CM | POA: Diagnosis not present

## 2021-04-07 ENCOUNTER — Telehealth: Payer: Medicare Other

## 2021-04-07 NOTE — Telephone Encounter (Signed)
Per referral [facility] has been trying to reach the patient to schedule.   Referral Notes. Type Date User Summary Attachment  General 04/04/2021  8:18 PM Webb Laws D - -  Note   Mychart message sent to patient to call back to schedule

## 2021-04-07 NOTE — Telephone Encounter (Signed)
Spoke with patient, she is aware that she needs to call [facility] to schedule her appt.

## 2021-04-08 ENCOUNTER — Other Ambulatory Visit: Payer: Self-pay | Admitting: Internal Medicine

## 2021-04-11 ENCOUNTER — Ambulatory Visit (INDEPENDENT_AMBULATORY_CARE_PROVIDER_SITE_OTHER): Payer: Medicare Other | Admitting: Gastroenterology

## 2021-04-11 ENCOUNTER — Encounter: Payer: Self-pay | Admitting: Gastroenterology

## 2021-04-11 ENCOUNTER — Other Ambulatory Visit (INDEPENDENT_AMBULATORY_CARE_PROVIDER_SITE_OTHER): Payer: Medicare Other

## 2021-04-11 VITALS — BP 112/78 | HR 58 | Ht 63.0 in | Wt 299.0 lb

## 2021-04-11 DIAGNOSIS — R1310 Dysphagia, unspecified: Secondary | ICD-10-CM

## 2021-04-11 DIAGNOSIS — K5900 Constipation, unspecified: Secondary | ICD-10-CM

## 2021-04-11 DIAGNOSIS — R110 Nausea: Secondary | ICD-10-CM

## 2021-04-11 DIAGNOSIS — I4891 Unspecified atrial fibrillation: Secondary | ICD-10-CM

## 2021-04-11 DIAGNOSIS — R630 Anorexia: Secondary | ICD-10-CM

## 2021-04-11 LAB — CBC WITH DIFFERENTIAL/PLATELET
Basophils Absolute: 0.1 10*3/uL (ref 0.0–0.1)
Basophils Relative: 0.8 % (ref 0.0–3.0)
Eosinophils Absolute: 0.1 10*3/uL (ref 0.0–0.7)
Eosinophils Relative: 1.1 % (ref 0.0–5.0)
HCT: 36.3 % (ref 36.0–46.0)
Hemoglobin: 11.9 g/dL — ABNORMAL LOW (ref 12.0–15.0)
Lymphocytes Relative: 15.5 % (ref 12.0–46.0)
Lymphs Abs: 1.2 10*3/uL (ref 0.7–4.0)
MCHC: 32.8 g/dL (ref 30.0–36.0)
MCV: 89.4 fl (ref 78.0–100.0)
Monocytes Absolute: 0.5 10*3/uL (ref 0.1–1.0)
Monocytes Relative: 6.1 % (ref 3.0–12.0)
Neutro Abs: 6 10*3/uL (ref 1.4–7.7)
Neutrophils Relative %: 76.5 % (ref 43.0–77.0)
Platelets: 238 10*3/uL (ref 150.0–400.0)
RBC: 4.06 Mil/uL (ref 3.87–5.11)
RDW: 15.6 % — ABNORMAL HIGH (ref 11.5–15.5)
WBC: 7.9 10*3/uL (ref 4.0–10.5)

## 2021-04-11 LAB — H. PYLORI ANTIBODY, IGG: H Pylori IgG: POSITIVE — AB

## 2021-04-11 MED ORDER — PROMETHAZINE HCL 12.5 MG PO TABS: 12.5000 mg | ORAL_TABLET | Freq: Four times a day (QID) | ORAL | 1 refills | Status: DC | PRN

## 2021-04-11 MED ORDER — OMEPRAZOLE 40 MG PO CPDR: 40.0000 mg | DELAYED_RELEASE_CAPSULE | Freq: Every day | ORAL | 2 refills | Status: DC

## 2021-04-11 NOTE — Progress Notes (Signed)
HPI :  71 year old female with a history of diastolic heart failure, COPD, morbid obesity with BMI 52, A-fib on Eliquis, immobility, referred by Dr. Viviana Simpler for symptoms of nausea, weight loss, bowel changes.  She states this past month, January 2023, she felt that her appetite has been poor.  She has no appetite.  She frequently feels nauseated but does not really vomit.  She has tried some Zofran which does not provide much help, she is nauseated despite this.  She feels that its not really worse when she eats.  She does not have any clear triggers for this.  She has occasional heartburn that bothers her but not all the time.  She denies any early satiety.  She does endorse some dysphagia in her throat to both solids and liquids that has been ongoing for a few years now but more frequent lately.  She also has sense of frequent throat clearing.  She has not been on any PPI.  She does note poor dentition in her teeth can be loose.  She thinks over the past months she lost 20 pounds, but then states that it could have been water weight as she has had changed her diuretics and has since regained the 20 pounds.  She has been on torsemide and Aldactone for fluid retention.  She denies any new medications around the time her symptoms started.  She does take low-dose prednisone for chronic arthritic pain.  She is also on oxycodone 20 mg once to twice daily.  She states she is been trying to take less of this lately.  She is on this for chronic back pain.  Denies any NSAID use.  Uses Tylenol as needed.  She has some occasional pains in her upper abdomen and lower abdomen that can come and go.  She states she had a change in bowel habits over the past month, she thinks she is only had 3 bowel movements total.  She does not have much urge to go but having a hard time evacuate stool when she does need to go.  No blood in her stool.  She last had a colonoscopy with Dr. Collene Mares in January 2015.  Report obtained  after she left the office.  The prep was adequate.  Her colon was normal without any polyps appreciated, small internal hemorrhoids noted.  She was told to repeat a colonoscopy in 10 years.  She has not been taking much for her bowels on a routine basis.  She does have chronic dyspnea.  Uses a walker for ambulation and to stand up.  She states she is also on amiodarone for her A-fib which feels fairly well controlled.  She is on Eliquis.  She has not been seen by cardiology since June of last year.  Of note she had a prolonged hospital stay in September for right shoulder dislocation and right shoulder arthroplasty.  Her hemoglobin at the end of that stay was 10.5 with an MCV of 93.  She has not had that followed up yet.   Nuclear stress test 06/01/20 - Abnormal pharmacologic myocardial perfusion stress test. There is a large in size, moderate in severity, completely reversible defect involving the mid anterior, anterolateral, and inferolateral segments, as well as the apical anterior and lateral segments. Defect is concerning for ischemia, though artifact cannot be excluded (shifting breast attenuation). Left ventricular systolic function is normal (LVEF 58%). Attenuation correction CT shows coronary artery calcification, aortic atherosclerosis, and dense mitral annular calcification. This is an  intermediate risk study.  Cardiac cath 06/04/20: Dist LAD lesion is 30% stenosed. Ost LAD to Mid LAD lesion is 40% stenosed.   1.  No evidence of obstructive coronary artery disease.  Relatively stable mild to moderate proximal LAD disease. 2.  Left ventricular angiography was not performed.  EF is normal by echo. 3.  Mildly elevated left ventricular end-diastolic pressure.    CT angio abdomen / pelvis 01/16/2018: IMPRESSION: No evidence of aortic aneurysm or dissection. Mild atherosclerotic disease in the distal descending thoracic aorta and throughout the abdominal aorta and iliac vessels. Mild  airway thickening and lower lobe bronchiectasis. Early peripheral fibrosis. Appearance of the lungs is stable dating back to 2017. No acute findings in the abdomen or pelvis. Moderate stool burden throughout the colon.  Colonoscopy -03-17-2013 -Dr. Collene Mares -adequate prep, normal exam, internal hemorrhoids   Past Medical History:  Diagnosis Date   Arthritis    RA   Asthma    Basal cell carcinoma    Cataract 2018   bilateral eyes; corrected with surgery   Claustrophobia    Collagen vascular disease ([place])    RA   COPD (chronic obstructive pulmonary disease) ([place])    Diastolic dysfunction    a. echo 07/2014: EF 55-60%, no RWMA, GR2DD, mild MR, LA moderately dilated, PASP 38 mm Hg   Dyslipidemia    Headache    migraines   Hemihypertrophy    History of cardiac cath    a. cardiac cath 05/24/2010 - nonobstructive CAD   History of gout    Hyperplastic colonic polyp 2003   Hypertension    Hypokalemia    Morbid obesity ([place])    PAF (paroxysmal atrial fibrillation) ([place])    a. on Pradaxa; b. CHADSVASc at least 2 (HTN & female)   Rheumatoid arthritis(714.0)    Sleep apnea    a. not compliant with CPAP     Past Surgical History:  Procedure Laterality Date   ABDOMINAL HYSTERECTOMY     APPLICATION OF WOUND VAC Right 08/09/2017   Procedure: APPLICATION OF WOUND VAC;  Surgeon: Albertine Patricia, DPM;  Location: [facility];  Service: Podiatry;  Laterality: Right;   CARDIAC CATHETERIZATION  05/24/2010   nonobstructive CAD   CATARACT EXTRACTION W/ INTRAOCULAR LENS IMPLANT Right 06/12/2016   Dr. Darleen Crocker   CATARACT EXTRACTION W/ INTRAOCULAR LENS IMPLANT Left 06/26/2016   Dr. Darleen Crocker   CESAREAN SECTION     CHOLECYSTECTOMY     COLONOSCOPY  06/12/2011   Procedure: COLONOSCOPY;  Surgeon: Juanita Craver, MD;  Location: [facility];  Service: Endoscopy;  Laterality: N/A;   COLONOSCOPY N/A 03/17/2013   Procedure: COLONOSCOPY;  Surgeon: Juanita Craver, MD;  Location: [facility];  Service:  Endoscopy;  Laterality: N/A;   EYE SURGERY     FOOT ARTHRODESIS Right 07/13/2017   Procedure: ARTHRODESIS FOOT-MULTI.FUSIONS (6 JOINTS);  Surgeon: Albertine Patricia, DPM;  Location: [facility];  Service: Podiatry;  Laterality: Right;   IRRIGATION AND DEBRIDEMENT FOOT Right 08/09/2017   Procedure: IRRIGATION AND DEBRIDEMENT FOOT;  Surgeon: Albertine Patricia, DPM;  Location: [facility];  Service: Podiatry;  Laterality: Right;   KNEE ARTHROSCOPY     bilateral   LEFT HEART CATH AND CORONARY ANGIOGRAPHY N/A 06/04/2020   Procedure: LEFT HEART CATH AND CORONARY ANGIOGRAPHY;  Surgeon: Wellington Hampshire, MD;  Location: [facility];  Service: Cardiovascular;  Laterality: N/A;   REVERSE SHOULDER ARTHROPLASTY Right 11/16/2020   Procedure: REVERSE SHOULDER ARTHROPLASTY;  Surgeon: Corky Mull, MD;  Location:  [facility];  Service: Orthopedics;  Laterality: Right;   SHOULDER CLOSED REDUCTION Right 11/11/2020   Procedure: CLOSED REDUCTION SHOULDER;  Surgeon: Corky Mull, MD;  Location: [facility];  Service: Orthopedics;  Laterality: Right;   SHOULDER INJECTION Right 11/11/2020   Procedure: SHOULDER INJECTION;  Surgeon: Corky Mull, MD;  Location: [facility];  Service: Orthopedics;  Laterality: Right;   SINUSOTOMY     TOOTH EXTRACTION  12/2016   VAGINAL HYSTERECTOMY     Family History  Problem Relation Age of Onset   Tuberculosis Mother    Parkinsonism Mother    Emphysema Father        smoked   Arthritis Father    Cancer Sister    Diabetes type II Sister    Breast cancer Sister    Tuberculosis Sister    Breast cancer Maternal Aunt    Colon cancer Neg Hx    Esophageal cancer Neg Hx    Pancreatic cancer Neg Hx    Stomach cancer Neg Hx    Social History   Tobacco Use   Smoking status: Never    Passive exposure: Current   Smokeless tobacco: Never  Vaping Use   Vaping Use: Never used  Substance Use Topics   Alcohol use: No   Drug use: No   Current Outpatient Medications  Medication Sig  Dispense Refill   amiodarone (PACERONE) 200 MG tablet Take 1 tablet (200 mg total) by mouth at bedtime. GIVE ONE TABLET BY AT BEDTIME 30 tablet 0   apixaban (ELIQUIS) 5 MG TABS tablet TAKE 1 TABLET(5 MG) BY MOUTH TWICE DAILY 60 tablet 0   atorvastatin (LIPITOR) 40 MG tablet Take 1 tablet (40 mg total) by mouth daily. 30 tablet 0   bisacodyl (DULCOLAX) 10 MG suppository Place 10 mg rectally as needed for moderate constipation.     gabapentin (NEURONTIN) 300 MG capsule Take 1 capsule (300 mg total) by mouth at bedtime. 90 capsule 3   ketoconazole (NIZORAL) 2 % cream APPLY A SMALL AMOUNT TO AFFECTED AREA ONCE A DAY TO RASH ON BUTTOCKS, GROIN 60 g 1   leflunomide (ARAVA) 20 MG tablet Take 1 tablet (20 mg total) by mouth daily. 30 tablet 0   meclizine (ANTIVERT) 25 MG tablet TAKE 1 TABLET(25 MG) BY MOUTH THREE TIMES DAILY AS NEEDED FOR DIZZINESS 90 tablet 5   metolazone (ZAROXOLYN) 2.5 MG tablet TAKE 1 TABLET BY MOUTH DAILY AS NEEDED MAY GIVE 1 TABLET AS NEEDED FOR WEIGHT GAIN OF 5 LBS IN 1 WEEK 30 tablet 11   metoprolol succinate (TOPROL-XL) 50 MG 24 hr tablet TAKE 1 TABLET BY MOUTH EVERY DAY WITH OR IMMEDIATELY FOLLOWING A MEAL 90 tablet 3   montelukast (SINGULAIR) 10 MG tablet TAKE 1 TABLET BY MOUTH AT BEDTIME 90 tablet 3   nitroGLYCERIN (NITROSTAT) 0.4 MG SL tablet Place 1 tablet (0.4 mg total) under the tongue every 5 (five) minutes as needed for chest pain. 20 tablet 12   NON FORMULARY Diet:Heart healthy     nystatin (MYCOSTATIN/NYSTOP) powder Apply 1 application topically 3 (three) times daily as needed. 60 g 0   ondansetron (ZOFRAN-ODT) 4 MG disintegrating tablet Take 1 tablet (4 mg total) by mouth every 8 (eight) hours as needed for nausea or vomiting. 30 tablet 1   Oxycodone HCl 20 MG TABS Take 1 tablet (20 mg total) by mouth in the morning and at bedtime. 60 tablet 0   polyethylene glycol (MIRALAX / GLYCOLAX) packet Take 17 g by mouth  daily as needed for mild constipation. 14 each 0    Potassium Chloride ER 20 MEQ TBCR Take 20 mEq by mouth daily at 12 noon. 30 tablet 0   predniSONE (DELTASONE) 5 MG tablet TAKE 3-4 TABLETS BY MOUTH DAILY WITH BREAKFAST. WEAN AS DIRECTED 120 tablet 2   SF 5000 PLUS 1.1 % CREA dental cream Place 1 application onto teeth 2 (two) times daily.  1   Sodium Phosphates (RA SALINE ENEMA RE) Place rectally as needed.     spironolactone (ALDACTONE) 25 MG tablet TAKE 1 TABLET(25 MG) BY MOUTH DAILY 30 tablet 0   terazosin (HYTRIN) 10 MG capsule TAKE 1 CAPSULE(10 MG) BY MOUTH AT BEDTIME 90 capsule 3   torsemide (DEMADEX) 20 MG tablet TAKE 2 TABLETS BY MOUTH DAILY FOR CONGESTIVE HEART FAILURE 60 tablet 11   No current facility-administered medications for this visit.   Allergies  Allergen Reactions   Fluoxetine Other (See Comments)    Headache, shaking, sleep issues Headache, shaking, sleep issues   Codeine     Nausea and vomiting/only when taking too much     Review of Systems: All systems reviewed and negative except where noted in HPI.    Lab Results  Component Value Date   CREATININE 1.4 (A) 11/23/2020   BUN 31 (A) 11/23/2020   NA 139 11/23/2020   K 4.1 11/23/2020   CL 97 (A) 11/23/2020   CO2 28 (A) 11/23/2020   Lab Results  Component Value Date   ALT 17 06/29/2020   AST 27 06/29/2020   ALKPHOS 107 06/29/2020   BILITOT 1.1 06/29/2020    Physical Exam: BP 112/78    Pulse (!) 58    Ht 5\' 3"  (1.6 m)    Wt 299 lb (135.6 kg) Comment: patient reported, unable to stand on scales per patient   BMI 52.97 kg/m  Constitutional: Pleasant, female in no acute distress. HEENT: Normocephalic and atraumatic. Conjunctivae are normal. No scleral icterus. Neck supple.  Cardiovascular: Normal rate, regular rhythm.  Pulmonary/chest: Effort normal and breath sounds normal.  Abdominal: Soft, protuberant / obese abdomen, nontender . There are no masses palpable.  DRE - [place] standby - stool in vault but soft, no impaction or mass  lesions Extremities: no edema Lymphadenopathy: No cervical adenopathy noted. Neurological: Alert and oriented to person place and time. Skin: Skin is warm and dry. No rashes noted. Psychiatric: Normal mood and affect. Behavior is normal.   ASSESSMENT AND PLAN: 70 year old female here for new patient assessment of the following:  Nausea Poor appetite Dysphagia Constipation Atrial fibrillation on anticoagulation  Ongoing poor appetite with persistent nausea over the past month.  Also with some occasional dysphagia as outlined above.  She also has some occasional reflux symptoms.  Initially had endorsed some weight loss of 20 pounds but it sounds like she has since regained that, perhaps due to fluid/water weight.  Discussed differential diagnosis for her symptoms.  Under normal circumstances would proceed with upper endoscopy initially for the symptoms, however she is higher than average risk for anesthesia with her medical problems and BMI greater than 50.  Is like she has had some fluctuations in her volume status and sensitive to diuretics, she should see her cardiologist before pursuing anesthesia for EGD.  They will work on that.  Otherwise we discussed other ways we could try to help her feel better in the interim.  We will start her empirically on omeprazole 40 mg daily for the next 30 days,  add Phenergan 12.5 mg every 8 hours as needed.  Ultimately I think she needs to stop narcotic use which is not a good medication to be on given the symptoms and her history of falls in the past.  We will try to wean off this and talk about it with her primary care.  I will recheck a CBC to make sure stable and also screen for H. pylori with an IgG serology.  We discussed options to evaluate her symptoms of dysphagia and upper tract symptoms while we await possible EGD.  She is agreeable to an upper GI series to initially evaluate.  Otherwise she has had constipation for the past month.  DRE done today shows  no impaction, she has soft stool in the vault.  Recommend she use MiraLAX twice daily and titrate up as needed.  Her colonoscopy was done about 8 years ago, no alarm symptoms otherwise.  If MiraLAX is not helping she should let us know, again, stopping narcotics will help the situation.  Plan: - start omeprazole 40mg  / day for 30 days - trial of adding phenergan 12.5mg  every 8 hours PRN  - stop narcotics - lab for CBC, and IgG - schedule upper GI series - Miralax BID and titrate up as needed - she will follow up with her cardiologist - if she does need an EGD for persistent symptoms, pending barium study, will  need to be done at the hospital for anesthesia support.   Patient and daughter in agreement with the plan as outlined.   Jolly Mango, MD [facility]  CC: Venia Carbon, MD

## 2021-04-11 NOTE — Patient Instructions (Addendum)
If you are age 72 or older, your body mass index should be between 23-30. Your Body mass index is 52.97 kg/m. If this is out of the aforementioned range listed, please consider follow up with your Primary Care Provider.  If you are age 64 or younger, your body mass index should be between 19-25. Your Body mass index is 52.97 kg/m. If this is out of the aformentioned range listed, please consider follow up with your Primary Care Provider.   ________________________________________________________  The [facility] providers would like to encourage you to use [facility] to communicate with providers for non-urgent requests or questions.  Due to long hold times on the telephone, sending your provider a message by [facility] may be a faster and more efficient way to get a response.  Please allow 48 business hours for a response.  Please remember that this is for non-urgent requests.  _______________________________________________________   Please go to the lab in the basement of our building to have lab work done as you leave today. Hit "B" for basement when you get on the elevator.  When the doors open the lab is on your left.  We will call you with the results. Thank you.   You have been scheduled for an Upper GI Series at [facility]. Your appointment is on Thursday, 04-28-21 at 9:30 am. Please arrive 30 minutes prior to your test for registration. Make certain not to have anything to eat or drink after midnight on the night before your test. If you need to reschedule, please contact radiology at [phone]. --------------------------------------------------------------------------------------------------------------- An upper GI series uses x rays to help diagnose problems of the upper GI tract, which includes the esophagus, stomach, and duodenum. The duodenum is the first part of the small intestine. An upper GI series is conducted by a radiology technologist or a radiologist--a doctor who specializes in  x-ray imaging--at a hospital or outpatient center. While sitting or standing in front of an x-ray machine, the patient drinks barium liquid, which is often white and has a chalky consistency and taste. The barium liquid coats the lining of the upper GI tract and makes signs of disease show up more clearly on x rays. X-ray video, called fluoroscopy, is used to view the barium liquid moving through the esophagus, stomach, and duodenum. Additional x rays and fluoroscopy are performed while the patient lies on an x-ray table. To fully coat the upper GI tract with barium liquid, the technologist or radiologist may press on the abdomen or ask the patient to change position. Patients hold still in various positions, allowing the technologist or radiologist to take x rays of the upper GI tract at different angles. If a technologist conducts the upper GI series, a radiologist will later examine the images to look for problems.  This test typically takes around 1 hour to complete.  Important : Drink plenty of water (8-10 cups/day) for a few days following the procedure to avoid constipation and blockage. The barium will make your stools white for a few days. ----------------------------------------------------------------------------------------------------------------------   We have sent the following medications to your pharmacy for you to pick up at your convenience: Omeprazole 40 mg: Take once daily Phenergan 12.5 IR:SWNI every 8 hours as needed  Please purchase the following medications over the counter and take as directed: Miralax: Take twice daily.  Minimize oxycodone use.  Please follow with Cardiology as soon as possible.  We will request your colonoscopy records from Dr. Lorie Apley office.  Thank you for entrusting me  with your care and for choosing [facility], Dr. Chamois Cellar

## 2021-04-12 ENCOUNTER — Other Ambulatory Visit: Payer: Self-pay

## 2021-04-12 DIAGNOSIS — A048 Other specified bacterial intestinal infections: Secondary | ICD-10-CM

## 2021-04-12 MED ORDER — AMOXICILLIN 500 MG PO TABS: 1000.0000 mg | ORAL_TABLET | Freq: Two times a day (BID) | ORAL | 0 refills | Status: AC

## 2021-04-12 MED ORDER — METRONIDAZOLE 500 MG PO TABS: 500.0000 mg | ORAL_TABLET | Freq: Two times a day (BID) | ORAL | 0 refills | Status: AC

## 2021-04-12 MED ORDER — CLARITHROMYCIN 500 MG PO TABS: 500.0000 mg | ORAL_TABLET | Freq: Two times a day (BID) | ORAL | 0 refills | Status: AC

## 2021-04-13 ENCOUNTER — Ambulatory Visit: Payer: Medicare Other | Admitting: Internal Medicine

## 2021-04-18 ENCOUNTER — Emergency Department
Admission: EM | Admit: 2021-04-18 | Discharge: 2021-04-18 | Disposition: A | Discharge status: Home / Self Care | Payer: Medicare Other | Source: Home / Self Care | Attending: Emergency Medicine | Admitting: Emergency Medicine

## 2021-04-18 ENCOUNTER — Other Ambulatory Visit: Payer: Self-pay

## 2021-04-18 ENCOUNTER — Emergency Department: Payer: Medicare Other

## 2021-04-18 DIAGNOSIS — I480 Paroxysmal atrial fibrillation: Secondary | ICD-10-CM | POA: Diagnosis not present

## 2021-04-18 DIAGNOSIS — G9340 Encephalopathy, unspecified: Secondary | ICD-10-CM | POA: Diagnosis not present

## 2021-04-18 DIAGNOSIS — T402X5A Adverse effect of other opioids, initial encounter: Secondary | ICD-10-CM | POA: Diagnosis present

## 2021-04-18 DIAGNOSIS — R531 Weakness: Secondary | ICD-10-CM | POA: Diagnosis not present

## 2021-04-18 DIAGNOSIS — R4182 Altered mental status, unspecified: Secondary | ICD-10-CM | POA: Diagnosis not present

## 2021-04-18 DIAGNOSIS — R609 Edema, unspecified: Secondary | ICD-10-CM

## 2021-04-18 DIAGNOSIS — Z20822 Contact with and (suspected) exposure to covid-19: Secondary | ICD-10-CM | POA: Diagnosis present

## 2021-04-18 DIAGNOSIS — Y92002 Bathroom of unspecified non-institutional (private) residence single-family (private) house as the place of occurrence of the external cause: Secondary | ICD-10-CM | POA: Insufficient documentation

## 2021-04-18 DIAGNOSIS — G9341 Metabolic encephalopathy: Secondary | ICD-10-CM | POA: Diagnosis not present

## 2021-04-18 DIAGNOSIS — R600 Localized edema: Secondary | ICD-10-CM | POA: Insufficient documentation

## 2021-04-18 DIAGNOSIS — I10 Essential (primary) hypertension: Secondary | ICD-10-CM | POA: Diagnosis not present

## 2021-04-18 DIAGNOSIS — I130 Hypertensive heart and chronic kidney disease with heart failure and stage 1 through stage 4 chronic kidney disease, or unspecified chronic kidney disease: Secondary | ICD-10-CM | POA: Diagnosis present

## 2021-04-18 DIAGNOSIS — R110 Nausea: Secondary | ICD-10-CM | POA: Diagnosis present

## 2021-04-18 DIAGNOSIS — D631 Anemia in chronic kidney disease: Secondary | ICD-10-CM | POA: Diagnosis present

## 2021-04-18 DIAGNOSIS — S0990XA Unspecified injury of head, initial encounter: Secondary | ICD-10-CM | POA: Diagnosis not present

## 2021-04-18 DIAGNOSIS — I890 Lymphedema, not elsewhere classified: Secondary | ICD-10-CM | POA: Diagnosis present

## 2021-04-18 DIAGNOSIS — W19XXXA Unspecified fall, initial encounter: Secondary | ICD-10-CM | POA: Diagnosis not present

## 2021-04-18 DIAGNOSIS — E860 Dehydration: Secondary | ICD-10-CM | POA: Diagnosis not present

## 2021-04-18 DIAGNOSIS — R296 Repeated falls: Secondary | ICD-10-CM | POA: Diagnosis not present

## 2021-04-18 DIAGNOSIS — W01198A Fall on same level from slipping, tripping and stumbling with subsequent striking against other object, initial encounter: Secondary | ICD-10-CM | POA: Insufficient documentation

## 2021-04-18 DIAGNOSIS — M47812 Spondylosis without myelopathy or radiculopathy, cervical region: Secondary | ICD-10-CM | POA: Diagnosis not present

## 2021-04-18 DIAGNOSIS — M069 Rheumatoid arthritis, unspecified: Secondary | ICD-10-CM | POA: Diagnosis present

## 2021-04-18 DIAGNOSIS — R7989 Other specified abnormal findings of blood chemistry: Secondary | ICD-10-CM | POA: Insufficient documentation

## 2021-04-18 DIAGNOSIS — Z7901 Long term (current) use of anticoagulants: Secondary | ICD-10-CM | POA: Insufficient documentation

## 2021-04-18 DIAGNOSIS — Z6841 Body Mass Index (BMI) 40.0 and over, adult: Secondary | ICD-10-CM | POA: Diagnosis not present

## 2021-04-18 DIAGNOSIS — D72829 Elevated white blood cell count, unspecified: Secondary | ICD-10-CM | POA: Diagnosis not present

## 2021-04-18 DIAGNOSIS — I509 Heart failure, unspecified: Secondary | ICD-10-CM | POA: Insufficient documentation

## 2021-04-18 DIAGNOSIS — R410 Disorientation, unspecified: Secondary | ICD-10-CM | POA: Diagnosis not present

## 2021-04-18 DIAGNOSIS — E785 Hyperlipidemia, unspecified: Secondary | ICD-10-CM | POA: Diagnosis present

## 2021-04-18 DIAGNOSIS — F40240 Claustrophobia: Secondary | ICD-10-CM | POA: Diagnosis present

## 2021-04-18 DIAGNOSIS — G4700 Insomnia, unspecified: Secondary | ICD-10-CM | POA: Diagnosis present

## 2021-04-18 DIAGNOSIS — R42 Dizziness and giddiness: Secondary | ICD-10-CM | POA: Diagnosis not present

## 2021-04-18 DIAGNOSIS — R1111 Vomiting without nausea: Secondary | ICD-10-CM | POA: Diagnosis not present

## 2021-04-18 DIAGNOSIS — N1831 Chronic kidney disease, stage 3a: Secondary | ICD-10-CM | POA: Diagnosis not present

## 2021-04-18 DIAGNOSIS — E782 Mixed hyperlipidemia: Secondary | ICD-10-CM | POA: Diagnosis not present

## 2021-04-18 DIAGNOSIS — L89151 Pressure ulcer of sacral region, stage 1: Secondary | ICD-10-CM | POA: Diagnosis present

## 2021-04-18 DIAGNOSIS — J449 Chronic obstructive pulmonary disease, unspecified: Secondary | ICD-10-CM | POA: Insufficient documentation

## 2021-04-18 DIAGNOSIS — I5032 Chronic diastolic (congestive) heart failure: Secondary | ICD-10-CM | POA: Diagnosis not present

## 2021-04-18 DIAGNOSIS — R001 Bradycardia, unspecified: Secondary | ICD-10-CM | POA: Diagnosis not present

## 2021-04-18 DIAGNOSIS — Z79899 Other long term (current) drug therapy: Secondary | ICD-10-CM | POA: Diagnosis not present

## 2021-04-18 DIAGNOSIS — G894 Chronic pain syndrome: Secondary | ICD-10-CM | POA: Diagnosis present

## 2021-04-18 DIAGNOSIS — N179 Acute kidney failure, unspecified: Secondary | ICD-10-CM | POA: Diagnosis not present

## 2021-04-18 DIAGNOSIS — I517 Cardiomegaly: Secondary | ICD-10-CM | POA: Diagnosis not present

## 2021-04-18 LAB — URINALYSIS, ROUTINE W REFLEX MICROSCOPIC
Bacteria, UA: NONE SEEN
Bilirubin Urine: NEGATIVE
Glucose, UA: NEGATIVE mg/dL
Hgb urine dipstick: NEGATIVE
Ketones, ur: NEGATIVE mg/dL
Nitrite: NEGATIVE
Protein, ur: NEGATIVE mg/dL
Specific Gravity, Urine: 1.006 (ref 1.005–1.030)
pH: 7 (ref 5.0–8.0)

## 2021-04-18 LAB — BASIC METABOLIC PANEL
Anion gap: 12 (ref 5–15)
BUN: 36 mg/dL — ABNORMAL HIGH (ref 8–23)
CO2: 33 mmol/L — ABNORMAL HIGH (ref 22–32)
Calcium: 8.5 mg/dL — ABNORMAL LOW (ref 8.9–10.3)
Chloride: 91 mmol/L — ABNORMAL LOW (ref 98–111)
Creatinine, Ser: 1.56 mg/dL — ABNORMAL HIGH (ref 0.44–1.00)
GFR, Estimated: 35 mL/min — ABNORMAL LOW (ref >60)
Glucose, Bld: 152 mg/dL — ABNORMAL HIGH (ref 70–99)
Potassium: 3 mmol/L — ABNORMAL LOW (ref 3.5–5.1)
Sodium: 136 mmol/L (ref 135–145)

## 2021-04-18 LAB — CBC
HCT: 34.6 % — ABNORMAL LOW (ref 36.0–46.0)
Hemoglobin: 11.2 g/dL — ABNORMAL LOW (ref 12.0–15.0)
MCH: 29.8 pg (ref 26.0–34.0)
MCHC: 32.4 g/dL (ref 30.0–36.0)
MCV: 92 fL (ref 80.0–100.0)
Platelets: 218 10*3/uL (ref 150–400)
RBC: 3.76 MIL/uL — ABNORMAL LOW (ref 3.87–5.11)
RDW: 15.4 % (ref 11.5–15.5)
WBC: 7 10*3/uL (ref 4.0–10.5)
nRBC: 0 % (ref 0.0–0.2)

## 2021-04-18 LAB — TROPONIN I (HIGH SENSITIVITY): Troponin I (High Sensitivity): 17 ng/L (ref <18)

## 2021-04-18 LAB — BRAIN NATRIURETIC PEPTIDE: B Natriuretic Peptide: 253.9 pg/mL — ABNORMAL HIGH (ref 0.0–100.0)

## 2021-04-18 MED ORDER — ONDANSETRON HCL 4 MG/2ML IJ SOLN
4.0000 mg | Freq: Once | INTRAMUSCULAR | Status: AC
Start: 2021-04-18 — End: 2021-04-18
  Administered 2021-04-18: 4 mg via INTRAVENOUS
  Filled 2021-04-18: qty 2

## 2021-04-18 MED ORDER — POTASSIUM CHLORIDE CRYS ER 20 MEQ PO TBCR
40.0000 meq | EXTENDED_RELEASE_TABLET | Freq: Once | ORAL | Status: AC
  Administered 2021-04-18: 40 meq via ORAL
  Filled 2021-04-18: qty 2

## 2021-04-18 MED ORDER — FUROSEMIDE 10 MG/ML IJ SOLN
40.0000 mg | Freq: Once | INTRAMUSCULAR | Status: AC
Start: 2021-04-18 — End: 2021-04-18
  Administered 2021-04-18: 40 mg via INTRAVENOUS
  Filled 2021-04-18: qty 4

## 2021-04-18 NOTE — ED Triage Notes (Signed)
Pt comes via EMS with c/o fall  pt states she got dizzy and fell outside of bathroom and hit head on wall. Pt denies any LOC. Pt is on Eliquis. Pt states nausea prior to falling and dizziness.  Pt assisted up by Fire by time EMS got there   Pt arrives vomiting, A*OX4. Pt states no pain just the nausea.   VSS

## 2021-04-18 NOTE — ED Notes (Signed)
RN at bedside to administer zofran. IV infiltrated. Unable to obtain additional IV access.

## 2021-04-18 NOTE — ED Provider Notes (Signed)
----------------------------------------- °  7:00 PM on 04/18/2021 -----------------------------------------  I took over care of this patient from Dr. Archie Balboa.  The patient was planned for discharge but was waiting to diurese in the ED with the pure wick on after getting Lasix.  She states she feels better, and that she is ready to go home.  She is stable for discharge at this time.  I reiterated the results of the work-up, plan of care, and return precautions and she expressed understanding.   Arta Silence, MD 04/18/21 1901

## 2021-04-18 NOTE — ED Provider Notes (Signed)
[facility] Provider Note    Event Date/Time   First MD Initiated Contact with Patient 04/18/21 1320     (approximate)   History   Fall   HPI  Tina Pacheco is a 71 y.o. female  who, per outpatient clinic note dated 04/11/2021 has history of heart failure, COPD, atrial fibrillation on Eliquis, immobility, presents to the emergency department today after a fall and hitting her head.  She does state that she has had long history of balance issues.  Today when she was trying to walk she fell backwards and hit the back of her head against a wooden wall.  She does have some headache at this time but no severe pain.  No other significant injuries.  The patient has noticed increased swelling to her legs.  She denies any recent fevers.    Physical Exam   Triage Vital Signs: ED Triage Vitals  Enc Vitals Group     BP 04/18/21 1225 (!) 156/75     Pulse Rate 04/18/21 1225 62     Resp 04/18/21 1225 20     Temp 04/18/21 1225 98 F (36.7 C)     Temp Source 04/18/21 1225 Oral     SpO2 04/18/21 1225 96 %     Weight --      Height --      Head Circumference --      Peak Flow --      Pain Score 04/18/21 1229 0     Pain Loc --      Pain Edu? --      Excl. in [place]? --     Most recent vital signs: Vitals:   04/18/21 1225  BP: (!) 156/75  Pulse: 62  Resp: 20  Temp: 98 F (36.7 C)  SpO2: 96%    General: Awake, no distress.  CV:  Good peripheral perfusion.  Resp:  Normal effort.  Abd:  No distention.  MSK:  No cervical spine tenderness.  Bilateral lower extremity edema.    ED Results / Procedures / Treatments   Labs (all labs ordered are listed, but only abnormal results are displayed) Labs Reviewed  BASIC METABOLIC PANEL - Abnormal; Notable for the following components:      Result Value   Potassium 3.0 (*)    Chloride 91 (*)    CO2 33 (*)    Glucose, Bld 152 (*)    BUN 36 (*)    Creatinine, Ser 1.56 (*)    Calcium 8.5 (*)    GFR, Estimated 35  (*)    All other components within normal limits  CBC - Abnormal; Notable for the following components:   RBC 3.76 (*)    Hemoglobin 11.2 (*)    HCT 34.6 (*)    All other components within normal limits  URINALYSIS, ROUTINE W REFLEX MICROSCOPIC  CBG MONITORING, ED     EKG  I, Nance Pear, attending physician, personally viewed and interpreted this EKG  EKG Time: 1229 Rate: 59 Rhythm: sinus bradycardia Axis: normal Intervals: qtc 500 QRS: narrow ST changes: no st elevation Impression: abnormal ekg  RADIOLOGY CT head/cervical spine I independently interpreted and visualized the ct scans. My interpretation: No intracranial bleed or acute osseous abnormality Radiology interpretation:  IMPRESSION:  CT head:     1. No acute intracranial abnormality.  2. Chronic microvascular ischemic change and mild diffuse cerebral  volume loss.  3. Air-fluid level within the left sphenoid sinus, which may  represent acute sinusitis.     CT cervical spine:     1. No acute fracture or subluxation of the cervical spine.  2. Multilevel cervical spondylosis.  3. Areas of ground-glass attenuation within the bilateral lung  apices, which may represent an infectious or inflammatory process.  Dedicated chest radiographs recommended for further assessment.    CXR I independently interpreted and visualized the CXR. My interpretation: No pneumonia or pneumothorax Radiology interpretation:     IMPRESSION:  1. No acute findings. No evidence of pneumonia or pulmonary edema.  2. Stable cardiomegaly.  3. Chronic bronchitic changes and probable chronic interstitial lung  disease.     PROCEDURES:  Critical Care performed: No  Procedures   MEDICATIONS ORDERED IN ED: Medications  ondansetron (ZOFRAN) injection 4 mg (has no administration in time range)     IMPRESSION / MDM / ASSESSMENT AND PLAN / ED COURSE  I reviewed the triage vital signs and the nursing notes.                               Differential diagnosis includes, but is not limited to, concussion, intracranial bleed, fracture, anemia, electrolyte abnormality.  Patient presented to the emergency department today after a fall which resulted in a head injury.  Patient is on blood thinner.  CT scan was obtained which did not show any intracranial bleed.  Additionally no acute osseous injury to the head or neck.  Patient also describes however increased falls.  Family member does state that it seems to be when she has fluid overload.  She certainly does have bilateral lower extremity edema.  Blood work shows a slight elevation of BNP.  Because of this patient was given IV Lasix here in the emergency department.  Did start to have good diuresis.  Patient was given potassium.  I did discuss with patient portance of increasing her potassium over the next few days given that it was low here.  Additionally encourage patient to have her potassium level rechecked in the next few days. Will continue to observe patient during diuresis and if patient feels improved do think it would be reasonable to discharge home, and that she would not necessarily require admission at this time.  FINAL CLINICAL IMPRESSION(S) / ED DIAGNOSES   Final diagnoses:  Fall, initial encounter  Injury of head, initial encounter  Peripheral edema     Note:  This document was prepared using Dragon voice recognition software and may include unintentional dictation errors.    Nance Pear, MD 04/18/21 [phone]

## 2021-04-18 NOTE — ED Notes (Signed)
Pt transported to CT ?

## 2021-04-18 NOTE — Discharge Instructions (Addendum)
As we discussed please take your home potassium dose today and double it for the next few days. Talk to your doctor about having your potassium level rechecked later this week.  Please seek medical attention for any high fevers, chest pain, shortness of breath, change in behavior, persistent vomiting, bloody stool or any other new or concerning symptoms.

## 2021-04-18 NOTE — ED Notes (Signed)
Pt back from CT. CT tech stating unable to perform exam due to pt vomiting. MD aware.

## 2021-04-19 ENCOUNTER — Ambulatory Visit: Payer: Medicare Other | Admitting: Medical

## 2021-04-19 ENCOUNTER — Other Ambulatory Visit: Payer: Self-pay | Admitting: Internal Medicine

## 2021-04-20 ENCOUNTER — Emergency Department: Payer: Medicare Other

## 2021-04-20 ENCOUNTER — Inpatient Hospital Stay
Admission: EM | Admit: 2021-04-20 | Discharge: 2021-04-23 | DRG: 682 | Disposition: A | Discharge status: Home Health | Payer: Medicare Other | Attending: Internal Medicine | Admitting: Internal Medicine

## 2021-04-20 ENCOUNTER — Encounter: Payer: Self-pay | Admitting: Emergency Medicine

## 2021-04-20 ENCOUNTER — Other Ambulatory Visit: Payer: Self-pay

## 2021-04-20 ENCOUNTER — Telehealth: Payer: Self-pay | Admitting: Gastroenterology

## 2021-04-20 DIAGNOSIS — Z6841 Body Mass Index (BMI) 40.0 and over, adult: Secondary | ICD-10-CM | POA: Diagnosis not present

## 2021-04-20 DIAGNOSIS — Z20822 Contact with and (suspected) exposure to covid-19: Secondary | ICD-10-CM | POA: Diagnosis present

## 2021-04-20 DIAGNOSIS — M353 Polymyalgia rheumatica: Secondary | ICD-10-CM | POA: Diagnosis present

## 2021-04-20 DIAGNOSIS — I2510 Atherosclerotic heart disease of native coronary artery without angina pectoris: Secondary | ICD-10-CM | POA: Diagnosis present

## 2021-04-20 DIAGNOSIS — I5032 Chronic diastolic (congestive) heart failure: Secondary | ICD-10-CM | POA: Diagnosis present

## 2021-04-20 DIAGNOSIS — E782 Mixed hyperlipidemia: Secondary | ICD-10-CM

## 2021-04-20 DIAGNOSIS — S0990XA Unspecified injury of head, initial encounter: Secondary | ICD-10-CM | POA: Diagnosis present

## 2021-04-20 DIAGNOSIS — G9340 Encephalopathy, unspecified: Principal | ICD-10-CM

## 2021-04-20 DIAGNOSIS — L89151 Pressure ulcer of sacral region, stage 1: Secondary | ICD-10-CM | POA: Diagnosis present

## 2021-04-20 DIAGNOSIS — I130 Hypertensive heart and chronic kidney disease with heart failure and stage 1 through stage 4 chronic kidney disease, or unspecified chronic kidney disease: Secondary | ICD-10-CM | POA: Diagnosis present

## 2021-04-20 DIAGNOSIS — F40240 Claustrophobia: Secondary | ICD-10-CM | POA: Diagnosis present

## 2021-04-20 DIAGNOSIS — I480 Paroxysmal atrial fibrillation: Secondary | ICD-10-CM | POA: Diagnosis present

## 2021-04-20 DIAGNOSIS — E785 Hyperlipidemia, unspecified: Secondary | ICD-10-CM | POA: Diagnosis present

## 2021-04-20 DIAGNOSIS — D631 Anemia in chronic kidney disease: Secondary | ICD-10-CM | POA: Diagnosis present

## 2021-04-20 DIAGNOSIS — Z79899 Other long term (current) drug therapy: Secondary | ICD-10-CM

## 2021-04-20 DIAGNOSIS — I890 Lymphedema, not elsewhere classified: Secondary | ICD-10-CM

## 2021-04-20 DIAGNOSIS — R110 Nausea: Secondary | ICD-10-CM | POA: Diagnosis present

## 2021-04-20 DIAGNOSIS — E860 Dehydration: Secondary | ICD-10-CM | POA: Diagnosis present

## 2021-04-20 DIAGNOSIS — R32 Unspecified urinary incontinence: Secondary | ICD-10-CM | POA: Diagnosis present

## 2021-04-20 DIAGNOSIS — N1831 Chronic kidney disease, stage 3a: Secondary | ICD-10-CM | POA: Diagnosis present

## 2021-04-20 DIAGNOSIS — G4700 Insomnia, unspecified: Secondary | ICD-10-CM | POA: Diagnosis present

## 2021-04-20 DIAGNOSIS — I10 Essential (primary) hypertension: Secondary | ICD-10-CM | POA: Diagnosis present

## 2021-04-20 DIAGNOSIS — M25511 Pain in right shoulder: Secondary | ICD-10-CM

## 2021-04-20 DIAGNOSIS — B9681 Helicobacter pylori [H. pylori] as the cause of diseases classified elsewhere: Secondary | ICD-10-CM | POA: Diagnosis present

## 2021-04-20 DIAGNOSIS — R42 Dizziness and giddiness: Secondary | ICD-10-CM | POA: Diagnosis not present

## 2021-04-20 DIAGNOSIS — T402X5A Adverse effect of other opioids, initial encounter: Secondary | ICD-10-CM | POA: Diagnosis present

## 2021-04-20 DIAGNOSIS — W19XXXA Unspecified fall, initial encounter: Secondary | ICD-10-CM | POA: Diagnosis present

## 2021-04-20 DIAGNOSIS — R296 Repeated falls: Secondary | ICD-10-CM | POA: Diagnosis present

## 2021-04-20 DIAGNOSIS — G9341 Metabolic encephalopathy: Secondary | ICD-10-CM | POA: Diagnosis present

## 2021-04-20 DIAGNOSIS — L304 Erythema intertrigo: Secondary | ICD-10-CM | POA: Diagnosis present

## 2021-04-20 DIAGNOSIS — Z803 Family history of malignant neoplasm of breast: Secondary | ICD-10-CM

## 2021-04-20 DIAGNOSIS — M069 Rheumatoid arthritis, unspecified: Secondary | ICD-10-CM | POA: Diagnosis present

## 2021-04-20 DIAGNOSIS — M47812 Spondylosis without myelopathy or radiculopathy, cervical region: Secondary | ICD-10-CM | POA: Diagnosis present

## 2021-04-20 DIAGNOSIS — Z85828 Personal history of other malignant neoplasm of skin: Secondary | ICD-10-CM

## 2021-04-20 DIAGNOSIS — L8990 Pressure ulcer of unspecified site, unspecified stage: Secondary | ICD-10-CM | POA: Diagnosis present

## 2021-04-20 DIAGNOSIS — Z96611 Presence of right artificial shoulder joint: Secondary | ICD-10-CM | POA: Diagnosis present

## 2021-04-20 DIAGNOSIS — G4730 Sleep apnea, unspecified: Secondary | ICD-10-CM | POA: Diagnosis present

## 2021-04-20 DIAGNOSIS — G894 Chronic pain syndrome: Secondary | ICD-10-CM | POA: Diagnosis present

## 2021-04-20 DIAGNOSIS — M62838 Other muscle spasm: Secondary | ICD-10-CM | POA: Diagnosis present

## 2021-04-20 DIAGNOSIS — Z833 Family history of diabetes mellitus: Secondary | ICD-10-CM

## 2021-04-20 DIAGNOSIS — Z825 Family history of asthma and other chronic lower respiratory diseases: Secondary | ICD-10-CM

## 2021-04-20 DIAGNOSIS — Z888 Allergy status to other drugs, medicaments and biological substances status: Secondary | ICD-10-CM

## 2021-04-20 DIAGNOSIS — R4182 Altered mental status, unspecified: Secondary | ICD-10-CM | POA: Diagnosis not present

## 2021-04-20 DIAGNOSIS — R410 Disorientation, unspecified: Secondary | ICD-10-CM | POA: Diagnosis not present

## 2021-04-20 DIAGNOSIS — J449 Chronic obstructive pulmonary disease, unspecified: Secondary | ICD-10-CM | POA: Diagnosis present

## 2021-04-20 DIAGNOSIS — Z820 Family history of epilepsy and other diseases of the nervous system: Secondary | ICD-10-CM

## 2021-04-20 DIAGNOSIS — Z7901 Long term (current) use of anticoagulants: Secondary | ICD-10-CM

## 2021-04-20 DIAGNOSIS — D72829 Elevated white blood cell count, unspecified: Secondary | ICD-10-CM | POA: Diagnosis present

## 2021-04-20 DIAGNOSIS — N189 Chronic kidney disease, unspecified: Secondary | ICD-10-CM | POA: Diagnosis present

## 2021-04-20 DIAGNOSIS — Z79891 Long term (current) use of opiate analgesic: Secondary | ICD-10-CM

## 2021-04-20 DIAGNOSIS — Z885 Allergy status to narcotic agent status: Secondary | ICD-10-CM

## 2021-04-20 DIAGNOSIS — T50995A Adverse effect of other drugs, medicaments and biological substances, initial encounter: Secondary | ICD-10-CM | POA: Diagnosis present

## 2021-04-20 DIAGNOSIS — M5137 Other intervertebral disc degeneration, lumbosacral region: Secondary | ICD-10-CM | POA: Diagnosis present

## 2021-04-20 DIAGNOSIS — R5383 Other fatigue: Secondary | ICD-10-CM | POA: Diagnosis present

## 2021-04-20 DIAGNOSIS — R531 Weakness: Secondary | ICD-10-CM | POA: Diagnosis not present

## 2021-04-20 DIAGNOSIS — Z8719 Personal history of other diseases of the digestive system: Secondary | ICD-10-CM

## 2021-04-20 DIAGNOSIS — Z831 Family history of other infectious and parasitic diseases: Secondary | ICD-10-CM

## 2021-04-20 DIAGNOSIS — G2581 Restless legs syndrome: Secondary | ICD-10-CM

## 2021-04-20 DIAGNOSIS — N179 Acute kidney failure, unspecified: Principal | ICD-10-CM | POA: Diagnosis present

## 2021-04-20 DIAGNOSIS — Z8261 Family history of arthritis: Secondary | ICD-10-CM

## 2021-04-20 DIAGNOSIS — R609 Edema, unspecified: Secondary | ICD-10-CM | POA: Diagnosis not present

## 2021-04-20 DIAGNOSIS — Z91199 Patient's noncompliance with other medical treatment and regimen due to unspecified reason: Secondary | ICD-10-CM

## 2021-04-20 LAB — URINALYSIS, ROUTINE W REFLEX MICROSCOPIC
Bacteria, UA: NONE SEEN
Bilirubin Urine: NEGATIVE
Glucose, UA: NEGATIVE mg/dL
Hgb urine dipstick: NEGATIVE
Ketones, ur: NEGATIVE mg/dL
Nitrite: NEGATIVE
Protein, ur: NEGATIVE mg/dL
Specific Gravity, Urine: 1.006 (ref 1.005–1.030)
pH: 7 (ref 5.0–8.0)

## 2021-04-20 LAB — BASIC METABOLIC PANEL
Anion gap: 13 (ref 5–15)
BUN: 42 mg/dL — ABNORMAL HIGH (ref 8–23)
CO2: 31 mmol/L (ref 22–32)
Calcium: 9.6 mg/dL (ref 8.9–10.3)
Chloride: 89 mmol/L — ABNORMAL LOW (ref 98–111)
Creatinine, Ser: 1.88 mg/dL — ABNORMAL HIGH (ref 0.44–1.00)
GFR, Estimated: 28 mL/min — ABNORMAL LOW (ref >60)
Glucose, Bld: 130 mg/dL — ABNORMAL HIGH (ref 70–99)
Potassium: 3.8 mmol/L (ref 3.5–5.1)
Sodium: 133 mmol/L — ABNORMAL LOW (ref 135–145)

## 2021-04-20 LAB — HEPATIC FUNCTION PANEL
ALT: 23 U/L (ref 0–44)
AST: 34 U/L (ref 15–41)
Albumin: 3.6 g/dL (ref 3.5–5.0)
Alkaline Phosphatase: 79 U/L (ref 38–126)
Bilirubin, Direct: 0.5 mg/dL — ABNORMAL HIGH (ref 0.0–0.2)
Indirect Bilirubin: 0.4 mg/dL (ref 0.3–0.9)
Total Bilirubin: 0.9 mg/dL (ref 0.3–1.2)
Total Protein: 7 g/dL (ref 6.5–8.1)

## 2021-04-20 LAB — CBC
HCT: 35.9 % — ABNORMAL LOW (ref 36.0–46.0)
Hemoglobin: 11.5 g/dL — ABNORMAL LOW (ref 12.0–15.0)
MCH: 29.7 pg (ref 26.0–34.0)
MCHC: 32 g/dL (ref 30.0–36.0)
MCV: 92.8 fL (ref 80.0–100.0)
Platelets: 209 10*3/uL (ref 150–400)
RBC: 3.87 MIL/uL (ref 3.87–5.11)
RDW: 15.3 % (ref 11.5–15.5)
WBC: 10.9 10*3/uL — ABNORMAL HIGH (ref 4.0–10.5)
nRBC: 0 % (ref 0.0–0.2)

## 2021-04-20 LAB — TROPONIN I (HIGH SENSITIVITY)
Troponin I (High Sensitivity): 16 ng/L (ref <18)
Troponin I (High Sensitivity): 16 ng/L (ref <18)

## 2021-04-20 LAB — BRAIN NATRIURETIC PEPTIDE: B Natriuretic Peptide: 133.2 pg/mL — ABNORMAL HIGH (ref 0.0–100.0)

## 2021-04-20 MED ORDER — SODIUM CHLORIDE 0.9 % IV SOLN: Freq: Once | INTRAVENOUS | Status: AC

## 2021-04-20 MED ORDER — ACETAMINOPHEN 325 MG PO TABS
650.0000 mg | ORAL_TABLET | Freq: Four times a day (QID) | ORAL | Status: DC | PRN
  Administered 2021-04-21: 22:00:00 650 mg via ORAL
  Filled 2021-04-20: qty 2

## 2021-04-20 MED ORDER — ACETAMINOPHEN 650 MG RE SUPP: 650.0000 mg | Freq: Four times a day (QID) | RECTAL | Status: DC | PRN

## 2021-04-20 NOTE — ED Notes (Signed)
Pt cleaned of urine, linens changed, pericare performed, purewick replaced. Patient repositioned for comfort.

## 2021-04-20 NOTE — Telephone Encounter (Signed)
Called and spoke to patient's daughter.  She indicates that her mom has fallen 10 times since seeing [place] last week.  Note that She started on treatment for H Pylori on Tuesday, has not been taking narcotics and has been "sleeping a lot". She reports she got worse on Friday of last week, losing her balance a lot. By Monday, 2-20 they took her to the ER after a fall when she hit her head.  Patient has history of heart failure, COPD, atrial fibrillation on Eliquis. Patient was evaluated and released after receiving IV lasix and potassium . Daughter reports she is worse and still falling, and sleeping a lot. She is drinking, eating and urinating. She wants to know if she can stop the medications Dr. Havery Moros prescribed. She has an appointment with PCP on Monday. Patient's daughter understands that she should be monitored closely and phenergan should only be taken as needed and at night since it can cause drowsiness. She will take her back to the ER or call PCP for direction if her condition worsens

## 2021-04-20 NOTE — ED Notes (Signed)
Patient transported to CT via stretcher with CT tech, returned to ED room.

## 2021-04-20 NOTE — Telephone Encounter (Signed)
Patients daughter called to speak with someone about her mothers medications. Said the patient has been getting dizzy and she is trying to eliminate the ones she can.

## 2021-04-20 NOTE — ED Provider Notes (Signed)
[facility] Provider Note    Event Date/Time   First MD Initiated Contact with Patient 04/20/21 2102     (approximate)   History   Altered Mental Status and Weakness   HPI  Tina Pacheco is a 71 y.o. female with extensive past medical history including hypertension, hyperlipidemia, chronic pain, COPD, polymyalgia rheumatica, here with confusion and falls.  Patient reportedly was started on Phenergan for nausea approximately week and a half ago.  Since then, she has had increasing confusion, falls, and has began hallucinating.  She was actually seen here recently for fall, had unremarkable work-up and was sent home.  She has fallen multiple times every day since then.  She has been hallucinating.  Has been falling asleep.  She currently denies any specific complaints though easily falls asleep and is somewhat confused exam.  Patient also reportedly takes gabapentin as well as oxycodone.  Most of her confusion, however, corresponds with taking the Phenergan for nausea.  She is being worked up by her GI physician for this.  Denies any urinary symptoms.  No other complaints.     Physical Exam   Triage Vital Signs: ED Triage Vitals  Enc Vitals Group     BP 04/20/21 1742 (!) 127/94     Pulse Rate 04/20/21 1742 61     Resp 04/20/21 1742 16     Temp 04/20/21 1742 98.1 F (36.7 C)     Temp Source 04/20/21 1742 Oral     SpO2 04/20/21 1742 95 %     Weight 04/20/21 1740 299 lb (135.6 kg)     Height 04/20/21 1740 5\' 3"  (1.6 m)     Head Circumference --      Peak Flow --      Pain Score 04/20/21 1739 5     Pain Loc --      Pain Edu? --      Excl. in [place]? --     Most recent vital signs: Vitals:   04/20/21 2100 04/20/21 2215  BP: (!) 164/149 102/88  Pulse: 68 (!) 59  Resp: 17 14  Temp:    SpO2: 93% 95%     General: Awake, no distress.  Falls asleep easily. CV:  Good peripheral perfusion.  Regular rate and rhythm. Resp:  Normal effort.  Lungs clear to  auscultation bilaterally. Abd:  No distention.  No tenderness. Other:  Cranial nerves II through XII intact.  Strength out of 5 bilateral upper and lower extremities.  Easily falls asleep, confused.   ED Results / Procedures / Treatments   Labs (all labs ordered are listed, but only abnormal results are displayed) Labs Reviewed  BASIC METABOLIC PANEL - Abnormal; Notable for the following components:      Result Value   Sodium 133 (*)    Chloride 89 (*)    Glucose, Bld 130 (*)    BUN 42 (*)    Creatinine, Ser 1.88 (*)    GFR, Estimated 28 (*)    All other components within normal limits  CBC - Abnormal; Notable for the following components:   WBC 10.9 (*)    Hemoglobin 11.5 (*)    HCT 35.9 (*)    All other components within normal limits  URINALYSIS, ROUTINE W REFLEX MICROSCOPIC - Abnormal; Notable for the following components:   Color, Urine YELLOW (*)    APPearance CLEAR (*)    Leukocytes,Ua TRACE (*)    All other components within normal limits  HEPATIC FUNCTION PANEL - Abnormal; Notable for the following components:   Bilirubin, Direct 0.5 (*)    All other components within normal limits  BRAIN NATRIURETIC PEPTIDE - Abnormal; Notable for the following components:   B Natriuretic Peptide 133.2 (*)    All other components within normal limits  MAGNESIUM - Abnormal; Notable for the following components:   Magnesium 1.5 (*)    All other components within normal limits  RESP PANEL BY RT-PCR (FLU A&B, COVID) ARPGX2  MAGNESIUM  COMPREHENSIVE METABOLIC PANEL  CBC WITH DIFFERENTIAL/PLATELET  TROPONIN I (HIGH SENSITIVITY)  TROPONIN I (HIGH SENSITIVITY)     EKG Normal sinus rhythm, ventricular rate 61.  PR 162, QRS 88, QTc 463.  No acute ST elevations or depressions.  No EKG evidence of acute ischemia or infarct.   RADIOLOGY CT abdomen/pelvis: No acute intracranial abnormality CT head/C-spine: Negative Chest x-ray: Negative  I also independently reviewed and agree wit  radiologist interpretations.   PROCEDURES:  Critical Care performed: No  .1-3 Lead EKG Interpretation Performed by: Duffy Bruce, MD Authorized by: Duffy Bruce, MD     Interpretation: normal     ECG rate:  50-70   ECG rate assessment: bradycardic     Rhythm: sinus rhythm     Ectopy: none   Comments:     Indication: Weakness, confusion    MEDICATIONS ORDERED IN ED: Medications  acetaminophen (TYLENOL) tablet 650 mg (has no administration in time range)    Or  acetaminophen (TYLENOL) suppository 650 mg (has no administration in time range)  0.9 %  sodium chloride infusion ( Intravenous New Bag/Given 04/20/21 2324)     IMPRESSION / MDM / ASSESSMENT AND PLAN / ED COURSE  I reviewed the triage vital signs and the nursing notes.                               The patient is on the cardiac monitor to evaluate for evidence of arrhythmia and/or significant heart rate changes.  MDM:  71 yo F with PMHx HTN, HLD, CAD, chronic pain, RA, here with AMS. Suspect delirium in setting of phenergan use, polypharmacy. DDx includes occult infection, metabolic encephalopathy, CVA. UA negative, CXR clear, no signs of occult infection. BMP with mild AoCKD likely related to dehydration, with elevated BUN/Cr ratio. Mild leukocytosis noted. LFTs normal. Trop negative, EKG nonischemic, no signs of ischemia. CT head shows NAICA. CT A/P shows no apparent source for her nausea, abdominal pain.   Given reassuring labs, no focal deficits, suspect most of her delirium is 2/2 polypharmacy. Rx'ed for possible nausea related to PUD/H. Pylori per review of GI notes. Will admit for management. Pt, daughter updated and in agreement.   MEDICATIONS GIVEN IN ED: Medications  acetaminophen (TYLENOL) tablet 650 mg (has no administration in time range)    Or  acetaminophen (TYLENOL) suppository 650 mg (has no administration in time range)  0.9 %  sodium chloride infusion ( Intravenous New Bag/Given 04/20/21  2324)     Consults:  Hospitalist for admission   EMR reviewed  Dr. Havery Moros GI notes from 03/2021 ED visit 04/18/21     FINAL CLINICAL IMPRESSION(S) / ED DIAGNOSES   Final diagnoses:  Acute encephalopathy  Polypharmacy     Rx / DC Orders   ED Discharge Orders     None        Note:  This document was prepared using Dragon voice recognition software and  may include unintentional dictation errors.   Duffy Bruce, MD 04/21/21 [phone]

## 2021-04-20 NOTE — ED Triage Notes (Signed)
Pt comes into the ED via [facility] from home c/o weakness with multiple falls and AMS.  Pt this week the patient has had increased swelling in her legs.  Pt denies the same amount of oral intake as normal.  Pt also c/o right shoulder pain which is chronic and has been ongoing since September.  Per the family, the patient is getting more confused than her normal baseline.  Family reported dark urine as well.

## 2021-04-21 ENCOUNTER — Telehealth: Payer: Self-pay

## 2021-04-21 ENCOUNTER — Encounter: Payer: Self-pay | Admitting: Internal Medicine

## 2021-04-21 DIAGNOSIS — D72829 Elevated white blood cell count, unspecified: Secondary | ICD-10-CM | POA: Diagnosis present

## 2021-04-21 DIAGNOSIS — E860 Dehydration: Secondary | ICD-10-CM | POA: Diagnosis present

## 2021-04-21 DIAGNOSIS — L8990 Pressure ulcer of unspecified site, unspecified stage: Secondary | ICD-10-CM | POA: Diagnosis present

## 2021-04-21 DIAGNOSIS — R296 Repeated falls: Secondary | ICD-10-CM

## 2021-04-21 DIAGNOSIS — N1831 Chronic kidney disease, stage 3a: Secondary | ICD-10-CM | POA: Diagnosis present

## 2021-04-21 DIAGNOSIS — G9341 Metabolic encephalopathy: Secondary | ICD-10-CM | POA: Diagnosis not present

## 2021-04-21 DIAGNOSIS — N179 Acute kidney failure, unspecified: Secondary | ICD-10-CM | POA: Diagnosis present

## 2021-04-21 DIAGNOSIS — N189 Chronic kidney disease, unspecified: Secondary | ICD-10-CM | POA: Diagnosis present

## 2021-04-21 LAB — CBC WITH DIFFERENTIAL/PLATELET
Abs Immature Granulocytes: 0.06 10*3/uL (ref 0.00–0.07)
Basophils Absolute: 0.1 10*3/uL (ref 0.0–0.1)
Basophils Relative: 1 %
Eosinophils Absolute: 0.3 10*3/uL (ref 0.0–0.5)
Eosinophils Relative: 4 %
HCT: 35.2 % — ABNORMAL LOW (ref 36.0–46.0)
Hemoglobin: 11.1 g/dL — ABNORMAL LOW (ref 12.0–15.0)
Immature Granulocytes: 1 %
Lymphocytes Relative: 18 %
Lymphs Abs: 1.8 10*3/uL (ref 0.7–4.0)
MCH: 29.4 pg (ref 26.0–34.0)
MCHC: 31.5 g/dL (ref 30.0–36.0)
MCV: 93.1 fL (ref 80.0–100.0)
Monocytes Absolute: 0.7 10*3/uL (ref 0.1–1.0)
Monocytes Relative: 7 %
Neutro Abs: 6.7 10*3/uL (ref 1.7–7.7)
Neutrophils Relative %: 69 %
Platelets: 195 10*3/uL (ref 150–400)
RBC: 3.78 MIL/uL — ABNORMAL LOW (ref 3.87–5.11)
RDW: 15.4 % (ref 11.5–15.5)
WBC: 9.7 10*3/uL (ref 4.0–10.5)
nRBC: 0 % (ref 0.0–0.2)

## 2021-04-21 LAB — BLOOD GAS, VENOUS
Acid-Base Excess: 13.7 mmol/L — ABNORMAL HIGH (ref 0.0–2.0)
Bicarbonate: 39 mmol/L — ABNORMAL HIGH (ref 20.0–28.0)
O2 Saturation: 79.3 %
Patient temperature: 37
pCO2, Ven: 50 mmHg (ref 44–60)
pH, Ven: 7.5 — ABNORMAL HIGH (ref 7.25–7.43)
pO2, Ven: 48 mmHg — ABNORMAL HIGH (ref 32–45)

## 2021-04-21 LAB — URINE DRUG SCREEN, QUALITATIVE (ARMC ONLY)
Amphetamines, Ur Screen: NOT DETECTED
Barbiturates, Ur Screen: NOT DETECTED
Benzodiazepine, Ur Scrn: NOT DETECTED
Cannabinoid 50 Ng, Ur ~~LOC~~: NOT DETECTED
Cocaine Metabolite,Ur ~~LOC~~: NOT DETECTED
MDMA (Ecstasy)Ur Screen: NOT DETECTED
Methadone Scn, Ur: NOT DETECTED
Opiate, Ur Screen: POSITIVE — AB
Phencyclidine (PCP) Ur S: NOT DETECTED
Tricyclic, Ur Screen: POSITIVE — AB

## 2021-04-21 LAB — COMPREHENSIVE METABOLIC PANEL
ALT: 22 U/L (ref 0–44)
AST: 44 U/L — ABNORMAL HIGH (ref 15–41)
Albumin: 3.4 g/dL — ABNORMAL LOW (ref 3.5–5.0)
Alkaline Phosphatase: 66 U/L (ref 38–126)
Anion gap: 12 (ref 5–15)
BUN: 42 mg/dL — ABNORMAL HIGH (ref 8–23)
CO2: 32 mmol/L (ref 22–32)
Calcium: 8.6 mg/dL — ABNORMAL LOW (ref 8.9–10.3)
Chloride: 91 mmol/L — ABNORMAL LOW (ref 98–111)
Creatinine, Ser: 1.7 mg/dL — ABNORMAL HIGH (ref 0.44–1.00)
GFR, Estimated: 32 mL/min — ABNORMAL LOW (ref >60)
Glucose, Bld: 133 mg/dL — ABNORMAL HIGH (ref 70–99)
Potassium: 3.3 mmol/L — ABNORMAL LOW (ref 3.5–5.1)
Sodium: 135 mmol/L (ref 135–145)
Total Bilirubin: 1.1 mg/dL (ref 0.3–1.2)
Total Protein: 6.4 g/dL — ABNORMAL LOW (ref 6.5–8.1)

## 2021-04-21 LAB — RESP PANEL BY RT-PCR (FLU A&B, COVID) ARPGX2
Influenza A by PCR: NEGATIVE
Influenza B by PCR: NEGATIVE
SARS Coronavirus 2 by RT PCR: NEGATIVE

## 2021-04-21 LAB — TSH: TSH: 2.859 u[IU]/mL (ref 0.350–4.500)

## 2021-04-21 LAB — MAGNESIUM
Magnesium: 1.5 mg/dL — ABNORMAL LOW (ref 1.7–2.4)
Magnesium: 2.1 mg/dL (ref 1.7–2.4)

## 2021-04-21 LAB — AMMONIA: Ammonia: 12 umol/L (ref 9–35)

## 2021-04-21 LAB — CREATININE, URINE, RANDOM: Creatinine, Urine: 24 mg/dL

## 2021-04-21 LAB — CK: Total CK: 942 U/L — ABNORMAL HIGH (ref 38–234)

## 2021-04-21 LAB — SODIUM, URINE, RANDOM: Sodium, Ur: 90 mmol/L

## 2021-04-21 MED ORDER — POLYETHYLENE GLYCOL 3350 17 G PO PACK: 17.0000 g | PACK | Freq: Every day | ORAL | Status: DC | PRN

## 2021-04-21 MED ORDER — AMOXICILLIN 500 MG PO CAPS
1000.0000 mg | ORAL_CAPSULE | Freq: Two times a day (BID) | ORAL | Status: DC
  Administered 2021-04-21 – 2021-04-23 (×5): 1000 mg via ORAL
  Filled 2021-04-21 (×6): qty 2

## 2021-04-21 MED ORDER — LACTATED RINGERS IV SOLN: INTRAVENOUS | Status: AC

## 2021-04-21 MED ORDER — METOPROLOL SUCCINATE ER 50 MG PO TB24
50.0000 mg | ORAL_TABLET | Freq: Every day | ORAL | Status: DC
Start: 2021-04-21 — End: 2021-04-23
  Administered 2021-04-22 – 2021-04-23 (×2): 50 mg via ORAL
  Filled 2021-04-21 (×2): qty 1

## 2021-04-21 MED ORDER — MONTELUKAST SODIUM 10 MG PO TABS
10.0000 mg | ORAL_TABLET | Freq: Every day | ORAL | Status: DC
  Administered 2021-04-21 – 2021-04-22 (×2): 10 mg via ORAL
  Filled 2021-04-21 (×2): qty 1

## 2021-04-21 MED ORDER — CLARITHROMYCIN 500 MG PO TABS
500.0000 mg | ORAL_TABLET | Freq: Two times a day (BID) | ORAL | Status: DC
  Administered 2021-04-21 – 2021-04-23 (×5): 500 mg via ORAL
  Filled 2021-04-21 (×6): qty 1

## 2021-04-21 MED ORDER — MAGNESIUM SULFATE 2 GM/50ML IV SOLN
2.0000 g | Freq: Once | INTRAVENOUS | Status: AC
  Administered 2021-04-21: 2 g via INTRAVENOUS
  Filled 2021-04-21: qty 50

## 2021-04-21 MED ORDER — LIDOCAINE 5 % EX PTCH
2.0000 | MEDICATED_PATCH | CUTANEOUS | Status: DC
  Administered 2021-04-21 – 2021-04-22 (×2): 2 via TRANSDERMAL
  Filled 2021-04-21 (×2): qty 2

## 2021-04-21 MED ORDER — SODIUM CHLORIDE 0.9 % IV SOLN: INTRAVENOUS | Status: DC

## 2021-04-21 MED ORDER — POTASSIUM CHLORIDE CRYS ER 10 MEQ PO TBCR
40.0000 meq | EXTENDED_RELEASE_TABLET | Freq: Two times a day (BID) | ORAL | Status: DC
  Administered 2021-04-21: 40 meq via ORAL
  Filled 2021-04-21 (×2): qty 4

## 2021-04-21 MED ORDER — APIXABAN 5 MG PO TABS
5.0000 mg | ORAL_TABLET | Freq: Two times a day (BID) | ORAL | Status: DC
  Administered 2021-04-21 – 2021-04-23 (×5): 5 mg via ORAL
  Filled 2021-04-21 (×5): qty 1

## 2021-04-21 MED ORDER — PANTOPRAZOLE SODIUM 40 MG PO TBEC
40.0000 mg | DELAYED_RELEASE_TABLET | Freq: Every day | ORAL | Status: DC
  Administered 2021-04-21: 40 mg via ORAL
  Filled 2021-04-21 (×3): qty 1

## 2021-04-21 MED ORDER — METRONIDAZOLE 500 MG PO TABS
500.0000 mg | ORAL_TABLET | Freq: Two times a day (BID) | ORAL | Status: DC
  Administered 2021-04-21 – 2021-04-23 (×5): 500 mg via ORAL
  Filled 2021-04-21 (×5): qty 1

## 2021-04-21 MED ORDER — AMIODARONE HCL 200 MG PO TABS
200.0000 mg | ORAL_TABLET | Freq: Every day | ORAL | Status: DC
  Administered 2021-04-21 – 2021-04-22 (×2): 200 mg via ORAL
  Filled 2021-04-21 (×2): qty 1

## 2021-04-21 MED ORDER — SODIUM CHLORIDE 0.9 % IV BOLUS
500.0000 mL | Freq: Once | INTRAVENOUS | Status: AC
  Administered 2021-04-21: 500 mL via INTRAVENOUS

## 2021-04-21 MED ORDER — PREDNISONE 10 MG PO TABS
5.0000 mg | ORAL_TABLET | Freq: Every day | ORAL | Status: DC
Start: 2021-04-21 — End: 2021-04-23
  Administered 2021-04-21 – 2021-04-22 (×2): 5 mg via ORAL
  Filled 2021-04-21 (×2): qty 1

## 2021-04-21 MED ORDER — ATORVASTATIN CALCIUM 20 MG PO TABS
40.0000 mg | ORAL_TABLET | Freq: Every day | ORAL | Status: DC
  Administered 2021-04-21 – 2021-04-23 (×3): 40 mg via ORAL
  Filled 2021-04-21 (×3): qty 2

## 2021-04-21 MED ORDER — BISACODYL 10 MG RE SUPP: 10.0000 mg | RECTAL | Status: DC | PRN

## 2021-04-21 MED ORDER — ONDANSETRON HCL 4 MG/2ML IJ SOLN
4.0000 mg | Freq: Four times a day (QID) | INTRAMUSCULAR | Status: DC | PRN
  Administered 2021-04-21: 4 mg via INTRAVENOUS
  Filled 2021-04-21: qty 2

## 2021-04-21 MED ORDER — LEFLUNOMIDE 20 MG PO TABS
20.0000 mg | ORAL_TABLET | Freq: Every day | ORAL | Status: DC
  Administered 2021-04-21 – 2021-04-23 (×3): 20 mg via ORAL
  Filled 2021-04-21 (×3): qty 1

## 2021-04-21 NOTE — Progress Notes (Signed)
°   04/21/21 2000  Clinical Encounter Type  Visited With Patient  Visit Type Initial  Referral From Nurse  Consult/Referral To Chaplain   Chaplain responded to nurse consult. Upon Chaplain arrival nurses were tending to patient needs. Patient asked Chaplain to come back at a later time. Chaplain will follow up.

## 2021-04-21 NOTE — Assessment & Plan Note (Addendum)
On IV fluids as outlined.  Due to poor p.o. intake in the setting of encephalopathy.  Diuretics held, resumed at d/c.

## 2021-04-21 NOTE — Assessment & Plan Note (Addendum)
Heart rate controlled.  Continue Eliquis, amiodarone and metoprolol.  Telemetry.

## 2021-04-21 NOTE — Progress Notes (Addendum)
Progress Note   Patient: Tina Pacheco FBP:[id number] DOB: 09/25/1950 DOA: 04/20/2021     1 DOS: the patient was seen and examined on 04/21/2021   Brief hospital course: 71 y.o. female with PMH of chronic diastolic heart failure, hyperlipidemia, essential hypertension, paroxysmal atrial fibrillation chronically anticoagulated on Eliquis, stage IIIa chronic kidney disease with baseline creatinine 1.1-1.4, chronic pain on opioid therapy, recently diagnosed H pylori infection still completing triple antibiotic course per GI.  She presented to the ED on 04/20/2021 with worsening altered mental status for about one week including confusion and somnolence/lethargy with poor PO intake.  By history family provided and chart review, onset of patient's symptoms seemed to coincide with being prescribed phenergan for persistent nausea while also continuing oxycodone and gabapentin in the setting of AKI.  Assessment and Plan: * Acute metabolic encephalopathy- (present on admission) Present on admission, most likely due to polypharmacy with oxycodone, scheduled gabapentin, recent addition of Phenergan for nausea in addition to worsening renal function and poor clearance.  No evidence of infection were found.  Ammonia level was normal.  Head CT was negative for any acute findings.  VBG without hypercapnia. -- Hold oxycodone, Flexeril, gabapentin, Phenergan -- Manage AKI as below -- Delirium and fall precautions  Dehydration- (present on admission) On IV fluids as outlined.  Due to poor p.o. intake in the setting of encephalopathy.  Diuretics on hold.  Acute renal failure superimposed on stage 3a chronic kidney disease ([place])- (present on admission) Baseline creatinine 1.1-1.4.  AKI present on admission with creatinine 1.88 in the setting of poor oral intake with encephalopathy and signs of dehydration. -- Hold home spironolactone, torsemide -- Hold oxycodone and gabapentin -- Continue IV fluids -- Monitor  volume status closely -- Avoid nephrotoxic agents -- Monitor BMP  Frequent falls Possibly related to polypharmacy in addition to chronic pain and body habitus. -- Fall precautions -- PT OT when mental status improves  Leukocytosis- (present on admission) Present on admission with WBC 10.9, improved this morning to 9.7.  No evidence of infection.  Monitor CBC and clinically for signs and symptoms of infection.  Hypomagnesemia- (present on admission) Magnesium 1.5 this morning, replacing with IV.  Monitor and replace as needed.  Chronic diastolic CHF (congestive heart failure) ([place])- (present on admission) Volume status is difficult to assess in the setting of morbid obesity and lymphedema.  Diuretics are on hold in the setting of AKI, patient is getting IV fluids. Last echo in April 2022 EF 60 to 82%, grade 1 diastolic dysfunction, moderately dilated left atrium, mild LVH. -- Strict I/O's and daily weights -- Hold diuretics, resume when renal function improves --Continue metoprolol  Paroxysmal A-fib ([place])- (present on admission) Heart rate controlled.  Continue Eliquis, amiodarone and metoprolol.  Telemetry.  HTN (hypertension)- (present on admission) Patient had low blood pressures this morning, given 500 cc bolus with improvement. Continue metoprolol.  Diuretics are held due to AKI and dehydration.  HLD (hyperlipidemia)- (present on admission) Continue statin  Pressure injury of skin- (present on admission) POA.  I agree with the wound description as outlined. -- Frequent repositioning -- Diligent hygiene -- Local wound care -- Monitor for signs of infection Pressure Injury 04/21/21 Sacrum Stage 1 -  Intact skin with non-blanchable redness of a localized area usually over a bony prominence. (Active)  04/21/21 1546  Location: Sacrum  Location Orientation:   Staging: Stage 1 -  Intact skin with non-blanchable redness of a localized area usually over a bony prominence.  Wound  Description (Comments):   Present on Admission: Yes    COPD (chronic obstructive pulmonary disease) ([place])- (present on admission) No wheezing on exam to suggest exacerbation.  Respiratory status appears stable.  Does not appear to be on any inhalers at home.  Monitor.  Lymphedema Diuretics are on hold due to AKI and dehydration.  Monitor volume status closely.  Daily weights and strict I/O's.  Nausea- (present on admission) Being followed recently by GI and diagnosed with H. pylori.  She is still on triple antibiotic therapy.  Prescribed Phenergan for persistent nausea which seems to have contributed to her encephalopathy. --Hold Phenergan -- Zofran as needed -- Continue triple antibiotics to complete course -- Continue PPI  Vertigo Hold meclizine unless needed given AMS  Chronic pain syndrome- (present on admission) Patient takes oxycodone, gabapentin and as needed Flexeril at home.  Patient has rheumatoid arthritis, OA in addition to lumbosacral DDD, cervical DJD. -- Hold oxycodone and gabapentin until mental status improves  Rheumatoid arthritis ([place])- (present on admission) Continue home leflunomide and prednisone  Morbid obesity with BMI of 50.0-59.9, adult ([place]) Body mass index is 52.97 kg/m.  Significantly contributes to patient's chronic pain, falls. Complicates overall care and prognosis.  Recommend lifestyle modifications including physical activity and diet for weight loss and overall long-term health.         Subjective: Patient seen in the ED holding for bed.  Husband at bedside.  Patient wakes up and makes conversation although not everything she says makes sense.  She intermittently drifts back to sleep.  Still reporting nausea.  Husband confirms patient's mental status nowhere near her normal baseline.  Physical Exam: Vitals:   04/21/21 1000 04/21/21 1200 04/21/21 1344 04/21/21 1457  BP: (!) 74/47 (!) 111/55 116/65 96/68  Pulse: 79 62 65 71  Resp: 17 13  14 20   Temp: 98.7 F (37.1 C) 98.6 F (37 C) 98.7 F (37.1 C) 98.4 F (36.9 C)  TempSrc: Oral Oral Oral Oral  SpO2: 94% 93% 92% 93%  Weight:      Height:       General exam: Drowsy but able to engage appropriately, no acute distress, morbidly obese HEENT: atraumatic, clear conjunctiva, anicteric sclera, moist mucus membranes, hearing grossly normal  Respiratory system: CTAB but diminished generally due to body habitus, no wheezes, rales or rhonchi, normal respiratory effort. Cardiovascular system: normal S1/S2, RRR, unable to assess JVD, pitting edema bilateral lower extremities Gastrointestinal system: soft, NT, ND, no HSM felt, +bowel sounds. Central nervous system: Lethargic, oriented to self and place no gross focal neurologic deficits, normal speech Extremities: Bilateral lower extremity lymphedema Skin: dry, intact, normal temperature Psychiatry: Mood and affect appear normal on exam limited by patient's encephalopathy   Data Reviewed:  Labs reviewed and notable for VBG with pH 7.5, PCO2 normal at 50, PO2 48.  CMP notable for K3.3, chloride 91, glucose 133, BUN 42, creatinine 1.7, calcium 8.6, magnesium 2.1 improved from 1.5, albumin 3.4, AST 44, ammonia normal 12, CK elevated 942.  CBC notable for hemoglobin 11.1.  Negative influenza A B and COVID.  Urinalysis without evidence of infection.  UDS positive for opioids and tricyclics  Family Communication: Husband at bedside on rounds  Disposition: Status is: Inpatient Remains inpatient appropriate because: Severity of illness with persistent encephalopathy and AKI on IV fluids      Planned Discharge Destination: Home     Time spent: 40 minutes  Author: Ezekiel Slocumb, DO 04/21/2021 5:06 PM  For on  call review [email or web address].

## 2021-04-21 NOTE — Assessment & Plan Note (Signed)
Hold meclizine unless needed given AMS

## 2021-04-21 NOTE — Assessment & Plan Note (Addendum)
Baseline creatinine 1.1-1.4.  AKI present on admission with creatinine 1.88 in the setting of poor oral intake with encephalopathy and signs of dehydration. -- Treated with IV fluids and improved 2/24: Cr improved to 1.55  2/25: Cr 1.20 -- Held home spironolactone, torsemide - resume at discharge, monitor BP -- Resumed oxycodone and gabapentin once Cr improved, tolerating well -- Monitor BMP

## 2021-04-21 NOTE — Assessment & Plan Note (Addendum)
Present on admission, most likely due to polypharmacy with oxycodone, scheduled gabapentin, recent addition of Phenergan for nausea in addition to worsening renal function and poor clearance.  No evidence of infection were found.  Ammonia level was normal.  Head CT was negative for any acute findings.  VBG without hypercapnia. -- Held oxycodone, Flexeril, gabapentin, Phenergan on admission -- Manage AKI as below -- Delirium and fall precautions  2/24: Mental status is returned to baseline.  Slowly reintroducing held medications starting with oxycodone at lower dose.  2/25: mental status stable on home meds resumed yesterday. Tolerating pain med and gabapentin well with renal function recovered.

## 2021-04-21 NOTE — Telephone Encounter (Signed)
Spoke to husband She is still not back to normal---awake but still not talking right  I will follow her progress in the hospital

## 2021-04-21 NOTE — ED Notes (Signed)
Pt moved to Cpod placed on monitor, daughter at bedside.

## 2021-04-21 NOTE — Assessment & Plan Note (Addendum)
Volume status is difficult to assess in the setting of morbid obesity and lymphedema.  Diuretics are on hold in the setting of AKI, patient is getting IV fluids. Last echo in April 2022 EF 60 to 05%, grade 1 diastolic dysfunction, moderately dilated left atrium, mild LVH. -- Strict I/O's and daily weights -- Held diuretics, resume at d/c, Cr recovered --Continue metoprolol

## 2021-04-21 NOTE — Telephone Encounter (Signed)
Jan sorry to hear this. Looks like she got admitted to the hospital last night. She should stop the phenergan, not sure how much she was taking of this but that can make her drowsi. She should also stop the narcotics as I previously discussed with them. If she is not tolerating antibiotics she can stop them but this is now being managed by the hospital team and defer management to them while she is hospitalized.

## 2021-04-21 NOTE — Assessment & Plan Note (Signed)
Continue statin. 

## 2021-04-21 NOTE — Telephone Encounter (Signed)
I had called her home prior to seeing this message. Her husband advised me she was in the hospital.

## 2021-04-21 NOTE — Assessment & Plan Note (Addendum)
Patient takes oxycodone, gabapentin and as needed Flexeril at home.  Patient has rheumatoid arthritis, OA in addition to lumbosacral DDD, cervical DJD.  2/24: Resume oxycodone at reduced dose --Continue holding gabapentin and reassess if still needed, consider discontinuing altogether.  Patient reports gabapentin helps her sleep, unclear if she has much nerve pain  2/25: seems tolerating resumed oxycodone and gabapentin, mental status stable

## 2021-04-21 NOTE — Assessment & Plan Note (Addendum)
Patient had low blood pressures morning of 2/23, given 500 cc bolus with improvement. --Continue metoprolol.   --Diuretics are held due to AKI and dehydration. --As needed hydralazine if BP uncontrolled

## 2021-04-21 NOTE — Hospital Course (Addendum)
71 y.o. female with PMH of chronic diastolic heart failure, hyperlipidemia, essential hypertension, paroxysmal atrial fibrillation chronically anticoagulated on Eliquis, stage IIIa chronic kidney disease with baseline creatinine 1.1-1.4, chronic pain on opioid therapy, recently diagnosed H pylori infection still completing triple antibiotic course per GI.  She presented to the ED on 04/20/2021 with worsening altered mental status for about one week including confusion and somnolence/lethargy with poor PO intake.  By history family provided and chart review, onset of patient's symptoms seemed to coincide with being prescribed phenergan for persistent nausea while also continuing oxycodone and gabapentin in the setting of AKI.

## 2021-04-21 NOTE — Telephone Encounter (Signed)
[facility] - Client TELEPHONE ADVICE RECORD [facility] Patient Name: [facility] Tina Pacheco Gender: Female DOB: 23-Jan-1951 Age: 71 Y 2 M 14 D Return Phone Number: [phone] (Primary), [phone] (Secondary) Address[place]  [address] Client [facility] - Client Client Site [facility] - Day Provider [name] Contact Type Call Who Is Calling Patient / Member / Family / Caregiver Call Type Triage / Clinical Caller Name [name] Relationship To Patient Daughter Return Phone Number [phone] (Primary) Chief Complaint HEAD INJURY - and not acting right. Change in behaviour after hitting head. Reason for Call Symptomatic / Request for [place] states her mother fell and hit her head last week and is having memory issues, balance issues, dizziness. She has been incoherent and forgetful, has been difficult to wake up. She is not sure if she needs to take her to the hospital. Translation No Nurse Assessment Nurse: Hassell Done, RN, Melanie Date/Time [name]: 04/20/2021 4:18:32 PM Confirm and document reason for call. If symptomatic, describe symptoms. ---Caller states her mom fell last week and she has had a hard time waking up and started phenergan the 14th for nausea and has not seemed right since then . Daughter thinks she has had a hard time waking up after the fall. Went to Dr last Monday and seemed fine Does the patient have any new or worsening symptoms? ---Yes Tina a triage be completed? ---Yes Related visit to physician within the last 2 weeks? ---Yes Does the PT have any chronic conditions? (i.e. diabetes, asthma, this includes High risk factors for pregnancy, etc.) ---Yes List chronic conditions. ---asthma copd RA Is this a behavioral health or substance abuse call? ---No Guidelines Guideline Title Affirmed Question  Affirmed Notes Nurse Date/Time [name] Head Injury [1] ACUTE NEURO SYMPTOM AND [2] present now (DEFINITION: Hassell Done RN, Melanie 04/20/2021 4:22:05 PM PLEASE NOTE: All timestamps contained within this report are represented as [place] Standard Time. CONFIDENTIALTY NOTICE: This fax transmission is intended only for the addressee. It contains information that is legally privileged, confidential or otherwise protected from use or disclosure. If you are not the intended recipient, you are strictly prohibited from reviewing, disclosing, copying using or disseminating any of this information or taking any action in reliance on or regarding this information. If you have received this fax in error, please notify us immediately by telephone so that we can arrange for its return to [place]. Phone: [phone], Toll-Free: [phone], Fax: [phone] Page: 2 of 2 Call Id: [id number] Guidelines Guideline Title Affirmed Question Affirmed Notes Nurse Date/Time [name] difficult to awaken OR confused thinking and talking OR slurred speech OR weakness of arms OR unsteady walking) Disp. Time [name] Disposition Final User 04/20/2021 4:15:45 PM Send to Urgent Queue Franz Dell 04/20/2021 4:24:19 PM Call EMS 911 Now Yes Hassell Done, RN, Donnajean Lopes Disagree/Comply Comply Caller Understands Yes PreDisposition Home Care Care Advice Given Per Guideline CALL EMS 911 NOW: * Immediate medical attention is needed. You need to hang up and call 911 (or an ambulance). CARE ADVICE given per Head Injury (Adult) guideline. Referrals [place]

## 2021-04-21 NOTE — Assessment & Plan Note (Addendum)
Present on admission with WBC 10.9, resolved.  No evidence of infection.  Monitor CBC and clinically for signs and symptoms of infection.

## 2021-04-21 NOTE — Assessment & Plan Note (Signed)
No wheezing on exam to suggest exacerbation.  Respiratory status appears stable.  Does not appear to be on any inhalers at home.  Monitor.

## 2021-04-21 NOTE — Assessment & Plan Note (Addendum)
Magnesium replaced on 2/23 for Mg 1.5.  Monitor Mg and replace as needed.

## 2021-04-21 NOTE — Assessment & Plan Note (Signed)
Continue home leflunomide and prednisone

## 2021-04-21 NOTE — Assessment & Plan Note (Signed)
Body mass index is 52.97 kg/m.  Significantly contributes to patient's chronic pain, falls. Complicates overall care and prognosis.  Recommend lifestyle modifications including physical activity and diet for weight loss and overall long-term health.

## 2021-04-21 NOTE — Assessment & Plan Note (Signed)
POA.  I agree with the wound description as outlined. -- Frequent repositioning -- Diligent hygiene -- Local wound care -- Monitor for signs of infection Pressure Injury 04/21/21 Sacrum Stage 1 -  Intact skin with non-blanchable redness of a localized area usually over a bony prominence. (Active)  04/21/21 1546  Location: Sacrum  Location Orientation:   Staging: Stage 1 -  Intact skin with non-blanchable redness of a localized area usually over a bony prominence.  Wound Description (Comments):   Present on Admission: Yes

## 2021-04-21 NOTE — H&P (Signed)
History and Physical    PLEASE NOTE THAT DRAGON DICTATION SOFTWARE WAS USED IN THE CONSTRUCTION OF THIS NOTE.   Tina Pacheco IZT:[id number] DOB: Jan 14, 1951 DOA: 04/20/2021  PCP: Venia Carbon, MD  Patient coming from: home   I have personally briefly reviewed patient's old medical records in [place]  Chief Complaint: Altered mental status  HPI: Tina Pacheco is a 71 y.o. female with medical history significant for chronic diastolic heart failure, hyperlipidemia, essential hypertension, paroxysmal atrial fibrillation chronically anticoagulated on Eliquis, stage IIIa chronic kidney disease with baseline creatinine 1.1-1.4, who is [facility]21 with acute encephalopathy after presenting from home to [facility] ED for evaluation of altered mental status.  In the setting of the patient's presenting altered mental status, the following history is obtained via my discussions with the patient's daughter, who is present at bedside, in addition to my discussions with the EDP and via chart review.  Daughter reports that the patient has exhibited evidence of progressive confusion and somnolence over the last 1 week following initiation of phenegan 10 days ago.  Daughter also reports that the patient appears to be experiencing some visual hallucinations over the last few days, which is also new for her.  Over the last 4 to 5 days, she has experienced multiple ground-level falls without associated loss of consciousness.  Daughter does not believe that the patient has hit her head as a component of these falls.  In the setting of paroxysmal atrial fibrillation, she is chronically anticoagulated on Eliquis.  Patient was recently diagnosed with H. pylori, and was subsequently started on triple antibiotic therapy with amoxicillin, clarithromycin, and Flagyl, with a 2-week course of these 3 antibiotics starting on 04/12/2021, with final doses to occur on 04/26/2021.  She is also on daily  omeprazole, and was concomitantly started on prn Phenergan by her prescribing gastroenterologist.  Within a few days of starting these new medications, including the prn Phenergan , daughter reports noticing the patient to exhibit progressive evidence of confusion and somnolence relative to her baseline mental status and level of alertness.  She also has a history of chronic pain syndrome, for which she is on scheduled oxycodone on a twice daily basis.  Outpatient medications are also notable for scheduled gabapentin.  In the setting of the patient's progressive confusion and somnolence over the course of the last week, daughter reports that the patient has had a significant decline in her oral intake of both food and water over that timeframe.  Medical history notable for stage IIIa chronic kidney disease with baseline creatinine 1.1-1.4, with most recent prior serum creatinine data point noted to be 1.4 on 11/23/2020.      ED Course:  Vital signs in the ED were notable for the following: Afebrile; heart rate 59-82; blood pressure 127/94; respiratory rate 14-20, oxygen saturation 94 to 95% on room air.  Labs were notable for the following: CMP was notable for the following: Sodium 133, chloride 89, bicarbonate 31, BUN 42, creatinine 1.88, BUN/creatinine ratio 22.3, glucose 130, calcium corrected for mild hypoalbuminemia 10.0, albumin 3.6, otherwise, liver enzymes are found to be within normal limits.  Serum magnesium level 1.5.  CBC notable for will cell count 10,900.  Urinalysis notable for no white blood cells, no bacteria, no red blood cells, and was positive for hyaline cast.  BNP 133 compared to 253 on 04/18/2021, high-sensitivity troponin I x2 were both found to be 16.  COVID-19/influenza PCR negative.  Imaging and  additional notable ED work-up: EKG shows sinus rhythm with heart rate 61, normal intervals, no evidence of T wave or ST changes, including no evidence of ST elevation.  Chest x-ray  shows no evidence of acute cardiopulmonary process.  CT head shows no evidence of acute intracranial process, including no evidence of intracranial hemorrhage.  CT cervical spine shows no evidence of acute fracture or subluxation of the cervical spines.  CT abdomen/pelvis without contrast shows no evidence of acute intra-abdominal or intrapelvic process.  Subsequently, the patient was admitted for further evaluation and management of acute encephalopathy complicated by acute kidney injury superimposed on stage IIIa chronic kidney disease in the setting of perceived dehydration, as well as hypomagnesemia.    Review of Systems: As per HPI otherwise 10 point review of systems negative.   Past Medical History:  Diagnosis Date   Arthritis    RA   Asthma    Basal cell carcinoma    Cataract 2018   bilateral eyes; corrected with surgery   Claustrophobia    Collagen vascular disease ([place])    RA   COPD (chronic obstructive pulmonary disease) (HCC)    Diastolic dysfunction    a. echo 07/2014: EF 55-60%, no RWMA, GR2DD, mild MR, LA moderately dilated, PASP 38 mm Hg   Dyslipidemia    Headache    migraines   Hemihypertrophy    History of cardiac cath    a. cardiac cath 05/24/2010 - nonobstructive CAD   History of gout    Hyperplastic colonic polyp 2003   Hypertension    Hypokalemia    Morbid obesity ([place])    PAF (paroxysmal atrial fibrillation) ([place])    a. on Pradaxa; b. CHADSVASc at least 2 (HTN & female)   Rheumatoid arthritis(714.0)    Sleep apnea    a. not compliant with CPAP    Past Surgical History:  Procedure Laterality Date   ABDOMINAL HYSTERECTOMY     APPLICATION OF WOUND VAC Right 08/09/2017   Procedure: APPLICATION OF WOUND VAC;  Surgeon: Albertine Patricia, DPM;  Location: [facility] ORS;  Service: Podiatry;  Laterality: Right;   CARDIAC CATHETERIZATION  05/24/2010   nonobstructive CAD   CATARACT EXTRACTION W/ INTRAOCULAR LENS IMPLANT Right 06/12/2016   Dr. Darleen Crocker   CATARACT  EXTRACTION W/ INTRAOCULAR LENS IMPLANT Left 06/26/2016   Dr. Darleen Crocker   CESAREAN SECTION     CHOLECYSTECTOMY     COLONOSCOPY  06/12/2011   Procedure: COLONOSCOPY;  Surgeon: Juanita Craver, MD;  Location: [facility];  Service: Endoscopy;  Laterality: N/A;   COLONOSCOPY N/A 03/17/2013   Procedure: COLONOSCOPY;  Surgeon: Juanita Craver, MD;  Location: [facility];  Service: Endoscopy;  Laterality: N/A;   EYE SURGERY     FOOT ARTHRODESIS Right 07/13/2017   Procedure: ARTHRODESIS FOOT-MULTI.FUSIONS (6 JOINTS);  Surgeon: Albertine Patricia, DPM;  Location: [facility] OR[facility];  Service: Podiatry;  Laterality: Right;   IRRIGATION AND DEBRIDEMENT FOOT Right 08/09/2017   Procedure: IRRIGATION AND DEBRIDEMENT FOOT;  Surgeon: Albertine Patricia, DPM;  Location: [facility];  Service: Podiatry;  Laterality: Right;   KNEE ARTHROSCOPY     bilateral   LEFT HEART CATH AND CORONARY ANGIOGRAPHY N/A 06/04/2020   Procedure: LEFT HEART CATH AND CORONARY ANGIOGRAPHY;  Surgeon: Wellington Hampshire, MD;  Location: [facility];  Service: Cardiovascular;  Laterality: N/A;   REVERSE SHOULDER ARTHROPLASTY Right 11/16/2020   Procedure: REVERSE SHOULDER ARTHROPLASTY;  Surgeon: Corky Mull, MD;  Location: [facility];  Service: Orthopedics;  Laterality: Right;  SHOULDER CLOSED REDUCTION Right 11/11/2020   Procedure: CLOSED REDUCTION SHOULDER;  Surgeon: Corky Mull, MD;  Location: [facility];  Service: Orthopedics;  Laterality: Right;   SHOULDER INJECTION Right 11/11/2020   Procedure: SHOULDER INJECTION;  Surgeon: Corky Mull, MD;  Location: [facility];  Service: Orthopedics;  Laterality: Right;   SINUSOTOMY     TOOTH EXTRACTION  12/2016   VAGINAL HYSTERECTOMY      Social History:  reports that she has never smoked. She has been exposed to tobacco smoke. She has never used smokeless tobacco. She reports that she does not drink alcohol and does not use drugs.   Allergies  Allergen Reactions   Fluoxetine Other (See Comments)     Headache, shaking, sleep issues Headache, shaking, sleep issues   Codeine     Nausea and vomiting/only when taking too much    Family History  Problem Relation Age of Onset   Tuberculosis Mother    Parkinsonism Mother    Emphysema Father        smoked   Arthritis Father    Cancer Sister    Diabetes type II Sister    Breast cancer Sister    Tuberculosis Sister    Breast cancer Maternal Aunt    Colon cancer Neg Hx    Esophageal cancer Neg Hx    Pancreatic cancer Neg Hx    Stomach cancer Neg Hx     Family history reviewed and not pertinent    Prior to Admission medications   Medication Sig Start Date End Date Taking? Authorizing Provider  amiodarone (PACERONE) 200 MG tablet Take 1 tablet (200 mg total) by mouth at bedtime. GIVE ONE TABLET BY AT BEDTIME 12/18/20   Medina-Vargas, Monina C, NP  amoxicillin (AMOXIL) 500 MG tablet Take 2 tablets (1,000 mg total) by mouth 2 (two) times daily for 14 days. 04/12/21 04/26/21  Armbruster, Carlota Raspberry, MD  apixaban (ELIQUIS) 5 MG TABS tablet TAKE 1 TABLET(5 MG) BY MOUTH TWICE DAILY 12/18/20   Medina-Vargas, Monina C, NP  atorvastatin (LIPITOR) 40 MG tablet Take 1 tablet (40 mg total) by mouth daily. 12/18/20   Medina-Vargas, Monina C, NP  bisacodyl (DULCOLAX) 10 MG suppository Place 10 mg rectally as needed for moderate constipation.    [provider]  clarithromycin (BIAXIN) 500 MG tablet Take 1 tablet (500 mg total) by mouth 2 (two) times daily for 14 days. 04/12/21 04/26/21  Armbruster, Carlota Raspberry, MD  gabapentin (NEURONTIN) 300 MG capsule Take 1 capsule (300 mg total) by mouth at bedtime. 01/11/21   Venia Carbon, MD  ketoconazole (NIZORAL) 2 % cream APPLY A SMALL AMOUNT TO AFFECTED AREA ONCE A DAY TO RASH ON BUTTOCKS, GROIN 12/18/20   Medina-Vargas, Monina C, NP  leflunomide (ARAVA) 20 MG tablet Take 1 tablet (20 mg total) by mouth daily. 12/18/20   Medina-Vargas, Monina C, NP  meclizine (ANTIVERT) 25 MG tablet TAKE 1 TABLET(25 MG)  BY MOUTH THREE TIMES DAILY AS NEEDED FOR DIZZINESS 01/11/21   Viviana Simpler I, MD  metolazone (ZAROXOLYN) 2.5 MG tablet TAKE 1 TABLET BY MOUTH DAILY AS NEEDED MAY GIVE 1 TABLET AS NEEDED FOR WEIGHT GAIN OF 5 LBS IN 1 WEEK 03/29/21   Viviana Simpler I, MD  metoprolol succinate (TOPROL-XL) 50 MG 24 hr tablet TAKE 1 TABLET BY MOUTH EVERY DAY WITH OR IMMEDIATELY FOLLOWING A MEAL 02/04/21   Venia Carbon, MD  metroNIDAZOLE (FLAGYL) 500 MG tablet Take 1 tablet (500 mg total) by mouth 2 (  two) times daily for 14 days. 04/12/21 04/26/21  Armbruster, Carlota Raspberry, MD  montelukast (SINGULAIR) 10 MG tablet TAKE 1 TABLET BY MOUTH AT BEDTIME 02/14/21   Venia Carbon, MD  nitroGLYCERIN (NITROSTAT) 0.4 MG SL tablet Place 1 tablet (0.4 mg total) under the tongue every 5 (five) minutes as needed for chest pain. 06/05/20   Fritzi Mandes, MD  NON FORMULARY Diet:Heart healthy    [provider]  nystatin (MYCOSTATIN/NYSTOP) powder Apply 1 application topically 3 (three) times daily as needed. 12/18/20   Medina-Vargas, Monina C, NP  omeprazole (PRILOSEC) 40 MG capsule Take 1 capsule (40 mg total) by mouth daily. 04/11/21   Armbruster, Carlota Raspberry, MD  ondansetron (ZOFRAN-ODT) 4 MG disintegrating tablet Take 1 tablet (4 mg total) by mouth every 8 (eight) hours as needed for nausea or vomiting. 03/25/21   Venia Carbon, MD  Oxycodone HCl 20 MG TABS Take 1 tablet (20 mg total) by mouth in the morning and at bedtime. 03/22/21   Venia Carbon, MD  polyethylene glycol (MIRALAX / Floria Raveling) packet Take 17 g by mouth daily as needed for mild constipation. 07/16/17   Henreitta Leber, MD  Potassium Chloride ER 20 MEQ TBCR Take 20 mEq by mouth daily at 12 noon. 12/18/20   Medina-Vargas, Monina C, NP  predniSONE (DELTASONE) 5 MG tablet TAKE 3-4 TABLETS BY MOUTH DAILY WITH BREAKFAST. WEAN AS DIRECTED 04/08/21   Venia Carbon, MD  promethazine (PHENERGAN) 12.5 MG tablet Take 1 tablet (12.5 mg total) by mouth every 6 (six)  hours as needed for nausea or vomiting. 04/11/21   Armbruster, Carlota Raspberry, MD  SF 5000 PLUS 1.1 % CREA dental cream Place 1 application onto teeth 2 (two) times daily. 06/01/17   [provider]  Sodium Phosphates (RA SALINE ENEMA RE) Place rectally as needed.    [provider]  spironolactone (ALDACTONE) 25 MG tablet TAKE 1 TABLET(25 MG) BY MOUTH DAILY 12/18/20   Medina-Vargas, Monina C, NP  terazosin (HYTRIN) 10 MG capsule TAKE 1 CAPSULE(10 MG) BY MOUTH AT BEDTIME 02/07/21   Venia Carbon, MD  torsemide (DEMADEX) 20 MG tablet TAKE 2 TABLETS BY MOUTH DAILY FOR CONGESTIVE HEART FAILURE 03/29/21   Venia Carbon, MD     Objective    Physical Exam: Vitals:   04/20/21 1742 04/20/21 2045 04/20/21 2100 04/20/21 2215  BP: (!) 127/94 (!) 150/118 (!) 164/149 102/88  Pulse: 61 61 68 (!) 59  Resp: 16 20 17 14   Temp: 98.1 F (36.7 C)     TempSrc: Oral     SpO2: 95% 94% 93% 95%  Weight:      Height:        General: appears to be stated age; somnolent, will briefly open eyes to verbal stimuli, before falling back asleep; unable to follow directions at this time Skin: warm, dry, no rash Head:  AT/[place] Mouth:  Oral mucosa membranes appear dry, normal dentition Neck: supple; trachea midline Heart:  RRR; did not appreciate any M/R/G Lungs: CTAB, did not appreciate any wheezes, rales, or rhonchi Abdomen: + BS; soft, ND, NT Vascular: 2+ pedal pulses b/l; 2+ radial pulses b/l Extremities: no peripheral edema, no muscle wasting Neuro: In the setting of the patient's current mental status and associated inability to follow instructions, unable to perform full neurologic exam at this time.  As such, assessment of strength, sensation, and cranial nerves is limited at this time. Patient noted to spontaneously move all 4 extremities. No  tremors.      Labs on Admission: I have personally reviewed following labs and imaging studies  CBC: Recent Labs  Lab 04/18/21 1224  04/20/21 1742  WBC 7.0 10.9*  HGB 11.2* 11.5*  HCT 34.6* 35.9*  MCV 92.0 92.8  PLT 218 154   Basic Metabolic Panel: Recent Labs  Lab 04/18/21 1224 04/20/21 1742 04/20/21 2316  NA 136 133*  --   K 3.0* 3.8  --   CL 91* 89*  --   CO2 33* 31  --   GLUCOSE 152* 130*  --   BUN 36* 42*  --   CREATININE 1.56* 1.88*  --   CALCIUM 8.5* 9.6  --   MG  --   --  1.5*   GFR: Estimated Creatinine Clearance: 37.1 mL/min (A) (by C-G formula based on SCr of 1.88 mg/dL (H)). Liver Function Tests: Recent Labs  Lab 04/20/21 1742  AST 34  ALT 23  ALKPHOS 79  BILITOT 0.9  PROT 7.0  ALBUMIN 3.6   No results for input(s): LIPASE, AMYLASE in the last 168 hours. No results for input(s): AMMONIA in the last 168 hours. Coagulation Profile: No results for input(s): INR, PROTIME in the last 168 hours. Cardiac Enzymes: No results for input(s): CKTOTAL, CKMB, CKMBINDEX, TROPONINI in the last 168 hours. BNP (last 3 results) No results for input(s): PROBNP in the last 8760 hours. HbA1C: No results for input(s): HGBA1C in the last 72 hours. CBG: No results for input(s): GLUCAP in the last 168 hours. Lipid Profile: No results for input(s): CHOL, HDL, LDLCALC, TRIG, CHOLHDL, LDLDIRECT in the last 72 hours. Thyroid Function Tests: No results for input(s): TSH, T4TOTAL, FREET4, T3FREE, THYROIDAB in the last 72 hours. Anemia Panel: No results for input(s): VITAMINB12, FOLATE, FERRITIN, TIBC, IRON, RETICCTPCT in the last 72 hours. Urine analysis:    Component Value Date/Time   COLORURINE YELLOW (A) 04/20/2021 2214   APPEARANCEUR CLEAR (A) 04/20/2021 2214   APPEARANCEUR Cloudy 03/28/2014 1033   LABSPEC 1.006 04/20/2021 2214   LABSPEC 1.018 03/28/2014 1033   PHURINE 7.0 04/20/2021 2214   GLUCOSEU NEGATIVE 04/20/2021 2214   GLUCOSEU Negative 03/28/2014 1033   HGBUR NEGATIVE 04/20/2021 2214   BILIRUBINUR NEGATIVE 04/20/2021 2214   BILIRUBINUR Negative 03/28/2014 [place]  04/20/2021 2214   PROTEINUR NEGATIVE 04/20/2021 2214   NITRITE NEGATIVE 04/20/2021 2214   LEUKOCYTESUR TRACE (A) 04/20/2021 2214   LEUKOCYTESUR 3+ 03/28/2014 1033    Radiological Exams on Admission: CT ABDOMEN PELVIS WO CONTRAST  Result Date: 04/20/2021 CLINICAL DATA:  Weakness and multiple falls. EXAM: CT ABDOMEN AND PELVIS WITHOUT CONTRAST TECHNIQUE: Multidetector CT imaging of the abdomen and pelvis was performed following the standard protocol without IV contrast. RADIATION DOSE REDUCTION: This exam was performed according to the departmental dose-optimization program which includes automated exposure control, adjustment of the mA and/or kV according to patient size and/or use of iterative reconstruction technique. COMPARISON:  January 16, 2018 FINDINGS: Lower chest: No acute abnormality. Hepatobiliary: No focal liver abnormality is seen. Status post cholecystectomy. No biliary dilatation. Pancreas: Unremarkable. No pancreatic ductal dilatation or surrounding inflammatory changes. Spleen: Normal in size without focal abnormality. Adrenals/Urinary Tract: Adrenal glands are unremarkable. Kidneys are normal in size, without renal calculi or hydronephrosis. A subcentimeter exophytic cyst is seen along the posterior aspect of the mid left kidney. Bladder is unremarkable. Stomach/Bowel: Stomach is within normal limits. Appendix appears normal. No evidence of bowel wall thickening, distention, or inflammatory changes. Vascular/Lymphatic: Aortic atherosclerosis.  No enlarged abdominal or pelvic lymph nodes. Reproductive: Status post hysterectomy. No adnexal masses. Other: No abdominal wall hernia or abnormality. No abdominopelvic ascites. Musculoskeletal: Multilevel marked severity degenerative changes seen throughout the lumbar spine. IMPRESSION: 1. Evidence of prior cholecystectomy and hysterectomy. 2. Multilevel marked severity degenerative changes throughout the lumbar spine. 3. Aortic atherosclerosis.  Aortic Atherosclerosis (ICD10-I70.0). Electronically Signed   By: Virgina Norfolk M.D.   On: 04/20/2021 21:50   CT HEAD WO CONTRAST (5MM)  Result Date: 04/20/2021 CLINICAL DATA:  Altered mental status and weakness with multiple falls. EXAM: CT HEAD WITHOUT CONTRAST TECHNIQUE: Contiguous axial images were obtained from the base of the skull through the vertex without intravenous contrast. RADIATION DOSE REDUCTION: This exam was performed according to the departmental dose-optimization program which includes automated exposure control, adjustment of the mA and/or kV according to patient size and/or use of iterative reconstruction technique. COMPARISON:  April 18, 2021 FINDINGS: Brain: There is mild cerebral atrophy with widening of the extra-axial spaces and ventricular dilatation. There are areas of decreased attenuation within the white matter tracts of the supratentorial brain, consistent with microvascular disease changes. Vascular: No hyperdense vessel or unexpected calcification. Skull: Normal. Negative for fracture or focal lesion. Sinuses/Orbits: No acute finding. Other: None. IMPRESSION: 1. No acute intracranial abnormality. 2. Generalized cerebral atrophy and chronic small vessel white matter ischemic changes. Electronically Signed   By: Virgina Norfolk M.D.   On: 04/20/2021 21:55   CT Cervical Spine Wo Contrast  Result Date: 04/20/2021 CLINICAL DATA:  Weakness and multiple falls with altered mental status. EXAM: CT CERVICAL SPINE WITHOUT CONTRAST TECHNIQUE: Multidetector CT imaging of the cervical spine was performed without intravenous contrast. Multiplanar CT image reconstructions were also generated. RADIATION DOSE REDUCTION: This exam was performed according to the departmental dose-optimization program which includes automated exposure control, adjustment of the mA and/or kV according to patient size and/or use of iterative reconstruction technique. COMPARISON:  April 18, 2021  FINDINGS: Alignment: Normal. Skull base and vertebrae: No acute fracture. Degenerative changes seen along the tip of the dens. No primary bone lesion or focal pathologic process. Soft tissues and spinal canal: No prevertebral fluid or swelling. No visible canal hematoma. Disc levels: Mild multilevel endplate sclerosis is seen throughout the cervical spine with mild anterior osteophyte formation seen at the levels of C2-C3 and C6-C7. There is marked severity narrowing of the anterior atlantoaxial articulation. Mild multilevel intervertebral disc space narrowing is seen at the levels of C2-C3, C3-C4 and C4-C5. Mild to moderate severity intervertebral disc space narrowing is seen at C5-C6 and C6-C7. Bilateral marked severity multilevel facet joint hypertrophy is noted. Upper chest: Negative. Other: None. IMPRESSION: 1. No acute fracture or subluxation of the cervical spine. 2. Multilevel degenerative changes, as described above. Electronically Signed   By: Virgina Norfolk M.D.   On: 04/20/2021 21:53   DG Chest Portable 1 View  Result Date: 04/20/2021 CLINICAL DATA:  Weakness and multiple falls. EXAM: PORTABLE CHEST 1 VIEW COMPARISON:  April 18, 2021 FINDINGS: Limited study secondary to patient rotation. The cardiac silhouette is mildly enlarged and unchanged in size. Both lungs are clear. An intact right shoulder replacement is noted. The visualized skeletal structures are otherwise unremarkable. IMPRESSION: Stable exam without acute cardiopulmonary disease. Electronically Signed   By: Virgina Norfolk M.D.   On: 04/20/2021 21:46     EKG: Independently reviewed, with result as described above.    Assessment/Plan    Principal Problem:   Acute encephalopathy Active Problems:  HLD (hyperlipidemia)   HTN (hypertension)   Paroxysmal A-fib (HCC)   Chronic diastolic CHF (congestive heart failure) (HCC)   Acute renal failure superimposed on stage 3a chronic kidney disease (HCC)   Dehydration    Hypomagnesemia   Leukocytosis   Frequent falls     #) Acute encephalopathy: Progressive confusion/somnolence over the course of the last week, with suspected pharmacologic contributions given proximity to recent initiation of Phenergan , with additional pharmacologic contributions suspected on the basis of scheduled use of outpatient gabapentin and oxycodone, the effects of which have been amplified in the setting of acute kidney injury superimposed on stage IIIa CKD given the exclusive renal clearance of gabapentin as well as relative increase in the toxic metabolites associated with oxycodone in the setting of this decline in renal function.  No evidence of underlying infectious process at this time, including urinalysis that was inconsistent with UTI, while chest x-ray shows no evidence of acute cardiopulmonary process and COVID-19/influenza PCR were negative.  There is also a suspected contribution from mild dehydration as a consequence of the patient's decline in oral intake following the onset of aforementioned somnolence.  No overt acute focal neurologic deficits to suggest a contribution from an underlying acute CVA while presenting CT head shows no evidence of acute intracranial process. Seizures are also felt to be less likely. Will check VBG to evaluate for any contribution from hypercapnic encephalopathy.    Plan: fall precautions. Repeat CMP/CBC in the AM.  Check VBG, TSH, MMA, ionized calcium level, CPK, urinary drug screen.  Hold outpatient central acting medications for now, including oxycodone, gabapentin,Phenergan further evaluation management of AKI, as further detailed below.       #) Acute kidney injury superimposed on stage IIIa chronic kidney disease: In the setting of a history of CKD 3A, baseline creatinine 1.1-1.4, presenting serum creatinine found to be 1.88, which appears consistent with prerenal influences, with suspected contribution from dehydration in the setting of  clinical evidence of such, including dry oral mucous membranes, acute prerenal azotemia, while in the setting of report of recent decline in oral intake following onset of confusion/somnolence, as further detailed above.  Potential secondary pharmacologic exacerbation due to outpatient oxycodone, due to buildup of renally toxic metabolites as creatinine clearance worsened due to primary dehydration.  Potential further exacerbation from multiple outpatient diuretic medications.  Urinalysis notable for the presence of hyaline cast, consistent with suspected underlying dehydration.  Plan: Hold home spironolactone, torsemide, oxycodone, gabapentin.  Gentle IV fluids in the form of lactated Ringer's at 50 cc an hour x8 hours.  Monitor strict I's and O's and daily weights.  Tempt avoid nephrotoxic agents.  Add on random urine sodium as well as random urine creatinine.  Repeat BMP in the morning.  Check CPK.       #) Hypomagnesemia: Presenting serum magnesium level found to be 1.5.   Plan: Magnesium sulfate 2 g IV every 2 hours x1 dose now.  Repeat serum magnesium level in the morning.       #) Leukocytosis: Mildly elevated white blood cell count of 10,900.  No evidence of underlying infectious process, as further detailed above.  Rather, suspect mildly elevated white blood cell count status from contributions from the chemo concentration as consequence of presenting dehydration, as well as inflammatory contribution in the setting of multiple recent ground-level falls.  Plan: Gentle IV fluids, as above.  Monitor strict I's and O's and daily weights.  Repeat CBC and CMP in the morning.       #)  Frequent falls: Multiple ground-level falls over the last few days, this appears to be as a consequence of progressive somnolence/confusion, largely pharmacologic in nature, as further detailed above.  Does not appear to have been associate with any loss of consciousness, nor any hitting of the head, and  CT head performed earlier this evening was found to demonstrate no evidence of acute intracranial process, including no evidence of intracranial bleed, which is notable given that the patient is chronically anticoagulated on Eliquis.  No evidence of underlying infectious process, including urinalysis that is not suggestive of UTI.  Plan: Fall precautions ordered.  Further evaluation management of acute encephalopathy, as above, including holding of home central acting medications.  Repeat CMP and CBC in the morning.            #) Chronic diastolic heart failure: documented history of such, with most recent echocardiogram performed will in April 2022, which was notable for LVEF 66 5%, no focal wall motion abnormalities, mild LVH, and grade 1 diastolic dysfunction. No clinical evidence to suggest acutely decompensated heart failure at this time.  Rather, the patient appears mildly clinically dehydrated, as further detailed above home diuretic regimen reportedly consists of the following: Torsemide, spironolactone, as well as prn metolazone.    Plan: monitor strict I's & O's and daily weights. Repeat BMP in AM.  Hold home diuretic regimen.  Gentle IV fluids, as above.          #) Hyperlipidemia: documented h/o such. On high intensity atorvastatin as outpatient.    Plan: continue home statin.          #) Essential Hypertension: documented h/o such, with outpatient antihypertensive regimen including multiple diuretic medications as outlined above, metoprolol succinate.  SBP's in the ED today: Normotensive.  In the setting of clinical evidence of dehydration, will hold home diuretic medications for now.  Plan: Close monitoring of subsequent BP via routine VS. continue outpatient beta-blocker, will holding home diuretic medications, as further detailed above.         #) Paroxysmal atrial fibrillation: Documented history of such. In setting of CHA2DS2-VASc score of 5, there  is an indication for chronic anticoagulation for thromboembolic prophylaxis. Consistent with this, patient is chronically anticoagulated on Eliquis. Home AV nodal blocking regimen: Metoprolol succinate.  Most recent echocardiogram was performed in April 2022, with results as further detailed above. Presenting EKG demonstrates sinus rhythm without evidence of acute ischemic changes.  She is also on amiodarone as an outpatient.    Plan: monitor strict I's & O's and daily weights. Repeat BMP/CBC in AM.  Repeat serum mag level in the morning. Continue home AV nodal blocking regimen.  Continue home Eliquis and amiodarone.      DVT prophylaxis: SCD's + outpatient Eliquis Code Status: Full code Family Communication: The patient's case was discussed with her daughter, who is present at bedside Disposition Plan: Per Rounding Team Consults called: none;  Admission status: Inpatient; med telemetry   PLEASE NOTE THAT DRAGON DICTATION SOFTWARE WAS USED IN THE CONSTRUCTION OF THIS NOTE.   Byron DO [facility] [place]   04/21/2021, 2:05 AM

## 2021-04-21 NOTE — Telephone Encounter (Signed)
Per chart review tab pt was seen at [facility] ED and admitted to [facility]. Sending note to Dr Silvio Pate and Larene Beach CMA.

## 2021-04-21 NOTE — Assessment & Plan Note (Addendum)
Being followed recently by GI and diagnosed with H. pylori.  She is still on triple antibiotic therapy.  Prescribed Phenergan for persistent nausea which seems to have contributed to her encephalopathy. --Hold Phenergan -- Zofran as needed -- Continue triple antibiotics to complete 14-day course (end date 2/28) -- Continue PPI

## 2021-04-21 NOTE — Assessment & Plan Note (Signed)
Diuretics are on hold due to AKI and dehydration.  Monitor volume status closely.  Daily weights and strict I/O's.

## 2021-04-21 NOTE — Assessment & Plan Note (Addendum)
Due to polypharmacy and acute renal failure -- Fall precautions -- PT OT evaluations - HH recommended -- Minimize sedating medications -- Right shoulder x-ray today negative for any hardware failure fracture or dislocation

## 2021-04-22 ENCOUNTER — Inpatient Hospital Stay: Payer: Medicare Other

## 2021-04-22 DIAGNOSIS — G9341 Metabolic encephalopathy: Secondary | ICD-10-CM | POA: Diagnosis not present

## 2021-04-22 LAB — COMPREHENSIVE METABOLIC PANEL
ALT: 19 U/L (ref 0–44)
AST: 34 U/L (ref 15–41)
Albumin: 2.9 g/dL — ABNORMAL LOW (ref 3.5–5.0)
Alkaline Phosphatase: 61 U/L (ref 38–126)
Anion gap: 11 (ref 5–15)
BUN: 36 mg/dL — ABNORMAL HIGH (ref 8–23)
CO2: 31 mmol/L (ref 22–32)
Calcium: 9.2 mg/dL (ref 8.9–10.3)
Chloride: 94 mmol/L — ABNORMAL LOW (ref 98–111)
Creatinine, Ser: 1.55 mg/dL — ABNORMAL HIGH (ref 0.44–1.00)
GFR, Estimated: 36 mL/min — ABNORMAL LOW (ref >60)
Glucose, Bld: 121 mg/dL — ABNORMAL HIGH (ref 70–99)
Potassium: 4.1 mmol/L (ref 3.5–5.1)
Sodium: 136 mmol/L (ref 135–145)
Total Bilirubin: 1.3 mg/dL — ABNORMAL HIGH (ref 0.3–1.2)
Total Protein: 5.6 g/dL — ABNORMAL LOW (ref 6.5–8.1)

## 2021-04-22 LAB — CK: Total CK: 316 U/L — ABNORMAL HIGH (ref 38–234)

## 2021-04-22 LAB — CBC
HCT: 30.4 % — ABNORMAL LOW (ref 36.0–46.0)
Hemoglobin: 9.9 g/dL — ABNORMAL LOW (ref 12.0–15.0)
MCH: 29.5 pg (ref 26.0–34.0)
MCHC: 32.6 g/dL (ref 30.0–36.0)
MCV: 90.5 fL (ref 80.0–100.0)
Platelets: 193 10*3/uL (ref 150–400)
RBC: 3.36 MIL/uL — ABNORMAL LOW (ref 3.87–5.11)
RDW: 15.6 % — ABNORMAL HIGH (ref 11.5–15.5)
WBC: 8.3 10*3/uL (ref 4.0–10.5)
nRBC: 0 % (ref 0.0–0.2)

## 2021-04-22 LAB — MAGNESIUM: Magnesium: 1.9 mg/dL (ref 1.7–2.4)

## 2021-04-22 MED ORDER — HYDRALAZINE HCL 20 MG/ML IJ SOLN
10.0000 mg | Freq: Four times a day (QID) | INTRAMUSCULAR | Status: DC | PRN
Start: 2021-04-22 — End: 2021-04-23
  Filled 2021-04-22: qty 1

## 2021-04-22 MED ORDER — OXYCODONE HCL 5 MG PO TABS
5.0000 mg | ORAL_TABLET | Freq: Four times a day (QID) | ORAL | Status: DC | PRN
  Administered 2021-04-22: 18:00:00 10 mg via ORAL
  Administered 2021-04-22: 5 mg via ORAL
  Administered 2021-04-23 (×2): 10 mg via ORAL
  Filled 2021-04-22: qty 2
  Filled 2021-04-22: qty 1
  Filled 2021-04-22 (×2): qty 2

## 2021-04-22 MED ORDER — TRAZODONE HCL 50 MG PO TABS
50.0000 mg | ORAL_TABLET | Freq: Every evening | ORAL | Status: DC | PRN
  Administered 2021-04-23: 01:00:00 50 mg via ORAL
  Filled 2021-04-22: qty 1

## 2021-04-22 NOTE — Evaluation (Signed)
Physical Therapy Evaluation Patient Details Name: Tina Pacheco MRN: [id number] DOB: 05-03-1950 Today's Date: 04/22/2021  History of Present Illness  Pt is a 71 y/o F admitted on 04/20/21 with c/c of AMS & pt being treated for acute encephalopathy. PMH: chronic diastolic heart failure, HLD, essential HTN, paroxysmal a-fib on eliquis, stage 3A CKD, chronic pain syndrome, RA, claustrophobia HA, morbid obesity, vertigo, recent hx of R reverse total shoulder arthroplasty  Clinical Impression  Pt seen for PT evaluation with pt agreeable to tx. Pt reports prior to admission she ambulated short distances in the home with hemiwalker but has been falling. Pt also notes she sleeps in a recliner & feels as though she injured R shoulder around Thanksgiving when she had LOB when attempting to negotiation steps into family member's home. On this date, pt is able to come to sitting EOB with supervision with use of bed rails & HOB almost fully elevated.  Pt is able to complete transfer & ambulate very short distance in room with hemiwalker with CGA<>min assist. Pt is limited by chronic back pain but appears to be close to her baseline level of function. Pt reports her husband assisted her prior to admission & can assist at d/c. Will continue to follow pt acutely to address balance, endurance, & gait with LRAD.      Recommendations for follow up therapy are one component of a multi-disciplinary discharge planning process, led by the attending physician.  Recommendations may be updated based on patient status, additional functional criteria and insurance authorization.  Follow Up Recommendations Home health PT    Assistance Recommended at Discharge Intermittent Supervision/Assistance  Patient can return home with the following  A little help with walking and/or transfers;A little help with bathing/dressing/bathroom;Assistance with cooking/housework;Assist for transportation;Help with stairs or ramp for entrance     Equipment Recommendations None recommended by PT  Recommendations for Other Services       Functional Status Assessment Patient has had a recent decline in their functional status and demonstrates the ability to make significant improvements in function in a reasonable and predictable amount of time.     Precautions / Restrictions Precautions Precautions: Fall Restrictions Weight Bearing Restrictions: No      Mobility  Bed Mobility Overal bed mobility: Needs Assistance Bed Mobility: Supine to Sit     Supine to sit: HOB elevated, Supervision     General bed mobility comments: extra time to complete movement, use of bed rails & HOB fully elevated    Transfers Overall transfer level: Needs assistance Equipment used: Hemi-walker Transfers: Sit to/from Stand Sit to Stand: Min guard, Supervision           General transfer comment: close supervision<>CGA for sit>stand from EOB    Ambulation/Gait Ambulation/Gait assistance: Min guard, Min assist Gait Distance (Feet): 5 Feet Assistive device: Hemi-walker Gait Pattern/deviations: Decreased step length - right, Decreased step length - left, Decreased stride length Gait velocity: decreased     General Gait Details: Pt takes a few steps forward with hemiwalker & close supervision<>CGA then reports increased back pain & requests to sit down, upon turning pt with mild LOB with pt reaching for windowsill to stabilize with 1UE and use hemiwalker with other with pt requiring min assist to safely get to recliner.  Stairs            Wheelchair Mobility    Modified Rankin (Stroke Patients Only)       Balance Overall balance assessment: History of Falls, Needs assistance  Sitting-balance support: Feet supported, Bilateral upper extremity supported Sitting balance-Leahy Scale: Fair Sitting balance - Comments: supervision static sitting   Standing balance support: Single extremity supported, During functional  activity Standing balance-Leahy Scale: Poor                               Pertinent Vitals/Pain Pain Assessment Pain Assessment: 0-10 Pain Score: 9  Pain Location: chronic low back pain Pain Descriptors / Indicators: Discomfort Pain Intervention(s): Repositioned (utilized heating pad upon PT arrival)    Home Living Family/patient expects to be discharged to:: Private residence Living Arrangements: Spouse/significant other Available Help at Discharge: Family Type of Home: House Home Access: Ramped entrance       Home Layout: One level Home Equipment: Rollator (4 wheels);BSC/3in1;Hospital bed (hemiwalker)      Prior Function Prior Level of Function : History of Falls (last six months);Needs assist             Mobility Comments: Pt ambulates short household distances with hemiwalker but endorses 2 falls in the past 6 months (chart alludes to more falls in the recnet past), endorses that pt's husband does have a gait belt he can assist her with       Hand Dominance   Dominant Hand: Right    Extremity/Trunk Assessment   Upper Extremity Assessment Upper Extremity Assessment: RUE deficits/detail RUE Deficits / Details: limited use of RUE, reports she hasn't been able to use R arm since Thanksgiving, where she lost her balance & it's been hurting her since    Lower Extremity Assessment Lower Extremity Assessment: Generalized weakness (pt with scars to R foot/ankle 2/2 hx of sx)       Communication   Communication: No difficulties  Cognition Arousal/Alertness: Awake/alert Behavior During Therapy: WFL for tasks assessed/performed Overall Cognitive Status: Within Functional Limits for tasks assessed                                          General Comments      Exercises     Assessment/Plan    PT Assessment Patient needs continued PT services  PT Problem List Decreased strength;Decreased mobility;Decreased activity  tolerance;Pain;Decreased balance       PT Treatment Interventions Therapeutic exercise;DME instruction;Gait training;Balance training;Stair training;Neuromuscular re-education;Functional mobility training;Therapeutic activities;Patient/family education    PT Goals (Current goals can be found in the Care Plan section)  Acute Rehab PT Goals Patient Stated Goal: go home PT Goal Formulation: With patient Time For Goal Achievement: 04/29/21 Potential to Achieve Goals: Fair    Frequency Min 2X/week     Co-evaluation               AM-PAC PT "6 Clicks" Mobility  Outcome Measure Help needed turning from your back to your side while in a flat bed without using bedrails?: A Lot Help needed moving from lying on your back to sitting on the side of a flat bed without using bedrails?: A Lot Help needed moving to and from a bed to a chair (including a wheelchair)?: A Little Help needed standing up from a chair using your arms (e.g., wheelchair or bedside chair)?: A Little Help needed to walk in hospital room?: A Little Help needed climbing 3-5 steps with a railing? : A Lot 6 Click Score: 15    End of Session  Activity Tolerance: Patient limited by pain Patient left: in chair;with chair alarm set;with call bell/phone within reach Nurse Communication: Mobility status PT Visit Diagnosis: Unsteadiness on feet (R26.81);Muscle weakness (generalized) (M62.81);History of falling (Z91.81)    Time: 8166-1969 PT Time Calculation (min) (ACUTE ONLY): 18 min   Charges:   PT Evaluation $PT Eval Low Complexity: [place], PT, DPT 04/22/21, 2:53 PM   [name] 04/22/2021, 2:51 PM

## 2021-04-22 NOTE — Progress Notes (Signed)
°   04/22/21 0900  Clinical Encounter Type  Visited With Patient and family together  Visit Type Follow-up  Referral From Nurse  Consult/Referral To [place] followed up from nurse consult request for AD. Chaplain met with patient and daughter. Chaplain gave AD paperwork. Chaplain provided support to patient upon her waking up. Patient and daughter appreciated [place] visit.

## 2021-04-22 NOTE — Plan of Care (Signed)
°  Problem: Education: Goal: Knowledge of General Education information will improve Description: Including pain rating scale, medication(s)/side effects and non-pharmacologic comfort measures Outcome: Progressing   Problem: Health Behavior/Discharge Planning: Goal: Ability to manage health-related needs will improve Outcome: Not Progressing   Problem: Clinical Measurements: Goal: Ability to maintain clinical measurements within normal limits will improve Outcome: Progressing Goal: Will remain free from infection Outcome: Progressing Goal: Diagnostic test results will improve Outcome: Progressing Goal: Respiratory complications will improve Outcome: Progressing Goal: Cardiovascular complication will be avoided Outcome: Progressing   Problem: Nutrition: Goal: Adequate nutrition will be maintained Outcome: Progressing   Problem: Coping: Goal: Level of anxiety will decrease Outcome: Progressing   Problem: Elimination: Goal: Will not experience complications related to bowel motility Outcome: Progressing Goal: Will not experience complications related to urinary retention Outcome: Progressing   Problem: Pain Managment: Goal: General experience of comfort will improve Outcome: Progressing   Problem: Safety: Goal: Ability to remain free from injury will improve Outcome: Progressing   Problem: Skin Integrity: Goal: Risk for impaired skin integrity will decrease Outcome: Progressing

## 2021-04-22 NOTE — Evaluation (Signed)
Occupational Therapy Evaluation Patient Details Name: Tina Pacheco MRN: [id number] DOB: 10-Nov-1950 Today's Date: 04/22/2021   History of Present Illness Pt is a 71 y/o F admitted on 04/20/21 with c/c of AMS & pt being treated for acute encephalopathy. PMH: chronic diastolic heart failure, HLD, essential HTN, paroxysmal a-fib on eliquis, stage 3A CKD, chronic pain syndrome, RA, claustrophobia HA, morbid obesity, vertigo, recent hx of R reverse total shoulder arthroplasty   Clinical Impression   Patient presenting with decreased Ind in self care, balance, functional mobility/transfers, endurance, and safety awareness. Patient reports performing self care tasks without assistance and use of hemi walker for short distance ambulation at baseline. Pt has had several falls at home in the last 6 months and has not been able to functionally utilize R shoulder in several months since fx. Patient currently functioning at min A overall and is not far from baseline.  Patient will benefit from acute OT to increase overall independence in the areas of ADLs, functional mobility, and safety awareness in order to safely discharge home with family.     Recommendations for follow up therapy are one component of a multi-disciplinary discharge planning process, led by the attending physician.  Recommendations may be updated based on patient status, additional functional criteria and insurance authorization.   Follow Up Recommendations  Home health OT    Assistance Recommended at Discharge Frequent or constant Supervision/Assistance  Patient can return home with the following A little help with walking and/or transfers;A little help with bathing/dressing/bathroom;Help with stairs or ramp for entrance;Assist for transportation;Assistance with cooking/housework    Functional Status Assessment  Patient has had a recent decline in their functional status and demonstrates the ability to make significant improvements in  function in a reasonable and predictable amount of time.  Equipment Recommendations  None recommended by OT       Precautions / Restrictions Precautions Precautions: Fall Restrictions Weight Bearing Restrictions: No      Mobility Bed Mobility Overal bed mobility: Needs Assistance Bed Mobility: Supine to Sit, Sit to Supine     Supine to sit: HOB elevated, Supervision Sit to supine: Min assist        Transfers Overall transfer level: Needs assistance Equipment used: 1 person hand held assist Transfers: Sit to/from Stand Sit to Stand: Min assist                  Balance Overall balance assessment: History of Falls, Needs assistance Sitting-balance support: Feet supported, Bilateral upper extremity supported Sitting balance-Leahy Scale: Fair Sitting balance - Comments: supervision static sitting   Standing balance support: Single extremity supported, During functional activity Standing balance-Leahy Scale: Poor                             ADL either performed or assessed with clinical judgement   ADL Overall ADL's : Needs assistance/impaired     Grooming: Wash/dry hands;Wash/dry face;Sitting;Set up;Supervision/safety                                       Vision Patient Visual Report: No change from baseline              Pertinent Vitals/Pain Pain Assessment Pain Assessment: 0-10 Pain Score: 8  Pain Location: chronic low back pain Pain Descriptors / Indicators: Discomfort Pain Intervention(s): Repositioned, Premedicated before session, Monitored during session, Limited  activity within patient's tolerance     Hand Dominance Right   Extremity/Trunk Assessment Upper Extremity Assessment Upper Extremity Assessment: RUE deficits/detail RUE Deficits / Details: limited use of RUE, reports she hasn't been able to use R arm since Thanksgiving, where she lost her balance & it's been hurting her since   Lower Extremity  Assessment Lower Extremity Assessment: Generalized weakness       Communication Communication Communication: No difficulties   Cognition Arousal/Alertness: Awake/alert Behavior During Therapy: WFL for tasks assessed/performed Overall Cognitive Status: Within Functional Limits for tasks assessed                                                  Home Living Family/patient expects to be discharged to:: Private residence Living Arrangements: Spouse/significant other Available Help at Discharge: Family Type of Home: House Home Access: Ramped entrance     Home Layout: One level     Bathroom Shower/Tub: Tub/shower unit         Home Equipment: Rollator (4 wheels);BSC/3in1;Hospital bed   Additional Comments: uses rollator at baseline, is able to ambulate ~25ft. does not use AD in bathroom as it will not fit. Pt holds onto sink counter, grab bars and tub for stability.      Prior Functioning/Environment Prior Level of Function : History of Falls (last six months);Needs assist             Mobility Comments: Pt ambulates short household distances with hemiwalker but endorses 2 falls in the past 6 months (chart alludes to more falls in the recnet past), endorses that pt's husband does have a gait belt he can assist her with ADLs Comments: pt reports performing self care tasks without assistance. Husband performs IADL tasks        OT Problem List: Decreased strength;Pain;Decreased activity tolerance;Decreased safety awareness;Impaired balance (sitting and/or standing);Decreased knowledge of use of DME or AE;Decreased knowledge of precautions      OT Treatment/Interventions: Self-care/ADL training;Therapeutic exercise;Therapeutic activities;Energy conservation;DME and/or AE instruction;Manual therapy;Balance training;Patient/family education    OT Goals(Current goals can be found in the care plan section) Acute Rehab OT Goals Patient Stated Goal: to go  home OT Goal Formulation: With patient Time For Goal Achievement: 05/06/21 Potential to Achieve Goals: Fair ADL Goals Pt Will Perform Grooming: with supervision;standing Pt Will Perform Lower Body Dressing: with supervision;sit to/from stand Pt Will Transfer to Toilet: with supervision;ambulating Pt Will Perform Toileting - Clothing Manipulation and hygiene: with supervision;sit to/from stand  OT Frequency: Min 2X/week       AM-PAC OT "6 Clicks" Daily Activity     Outcome Measure Help from another person eating meals?: None Help from another person taking care of personal grooming?: A Little Help from another person toileting, which includes using toliet, bedpan, or urinal?: A Little Help from another person bathing (including washing, rinsing, drying)?: A Little Help from another person to put on and taking off regular upper body clothing?: A Little Help from another person to put on and taking off regular lower body clothing?: A Little 6 Click Score: 19   End of Session Nurse Communication: Mobility status  Activity Tolerance: Patient limited by pain Patient left: in bed;with call bell/phone within reach;with bed alarm set  OT Visit Diagnosis: Unsteadiness on feet (R26.81);Muscle weakness (generalized) (M62.81);Repeated falls (R29.6)  Time: 7425-9563 OT Time Calculation (min): 35 min Charges:  OT General Charges $OT Visit: 1 Visit OT Evaluation $OT Eval Moderate Complexity: 1 Mod OT Treatments $Self Care/Home Management : 8-22 mins $Therapeutic Activity: 8-22 mins  Darleen Crocker, MS, OTR/L , CBIS ascom [phone]  04/22/21, 4:33 PM

## 2021-04-22 NOTE — Progress Notes (Signed)
PT Cancellation Note  Patient Details Name: Tina Pacheco MRN: [id number] DOB: 28-Nov-1950   Cancelled Treatment:    Reason Eval/Treat Not Completed: Patient at procedure or test/unavailable Attempted to see pt at 11:45 AM for PT evaluation but pt out of room upon PT attempt.  Lavone Nian, PT, DPT 04/22/21, 1:30 PM   [name] 04/22/2021, 1:30 PM

## 2021-04-22 NOTE — Care Management Important Message (Signed)
Important Message  Patient Details  Name: Tina Pacheco MRN: [id number] Date of Birth: 04-03-1950   Medicare Important Message Given:  Yes     [name] 04/22/2021, 11:25 AM

## 2021-04-22 NOTE — Progress Notes (Signed)
Progress Note   Patient: HETHER Pacheco Pacheco:[id number] DOB: 15-Apr-1950 DOA: 04/20/2021     2 DOS: the patient was seen and examined on 04/22/2021   Brief hospital course: 71 y.o. female with PMH of chronic diastolic heart failure, hyperlipidemia, essential hypertension, paroxysmal atrial fibrillation chronically anticoagulated on Eliquis, stage IIIa chronic kidney disease with baseline creatinine 1.1-1.4, chronic pain on opioid therapy, recently diagnosed H pylori infection still completing triple antibiotic course per GI.  She presented to the ED on 04/20/2021 with worsening altered mental status for about one week including confusion and somnolence/lethargy with poor PO intake.  By history family provided and chart review, onset of patient's symptoms seemed to coincide with being prescribed phenergan for persistent nausea while also continuing oxycodone and gabapentin in the setting of AKI.  2/24: Patient's mental status back to baseline  Assessment and Plan: * Acute metabolic encephalopathy- (present on admission) Present on admission, most likely due to polypharmacy with oxycodone, scheduled gabapentin, recent addition of Phenergan for nausea in addition to worsening renal function and poor clearance.  No evidence of infection were found.  Ammonia level was normal.  Head CT was negative for any acute findings.  VBG without hypercapnia. -- Held oxycodone, Flexeril, gabapentin, Phenergan on admission -- Manage AKI as below -- Delirium and fall precautions  2/24: Mental status is returned to baseline.  Slowly reintroducing held medications starting with oxycodone at lower dose.  Dehydration- (present on admission) On IV fluids as outlined.  Due to poor p.o. intake in the setting of encephalopathy.  Diuretics on hold.  Acute renal failure superimposed on stage 3a chronic kidney disease ([place])- (present on admission) Baseline creatinine 1.1-1.4.  AKI present on admission with creatinine 1.88 in  the setting of poor oral intake with encephalopathy and signs of dehydration. 2/24: Creatinine improved to 1.55 today -- Hold home spironolactone, torsemide -- Hold oxycodone and gabapentin -- Continue gentle IV fluids -- Monitor volume status closely -- Avoid nephrotoxic agents -- Monitor BMP  Frequent falls Possibly related to polypharmacy in addition to chronic pain and body habitus. -- Fall precautions -- PT OT evaluations --Minimize sedating medications -- Right shoulder x-ray today negative for any hardware failure fracture or dislocation  Leukocytosis- (present on admission) Present on admission with WBC 10.9, resolved.  No evidence of infection.  Monitor CBC and clinically for signs and symptoms of infection.  Hypomagnesemia- (present on admission) Magnesium replaced on 2/23 for Mg 1.5.  Monitor Mg and replace as needed.  Chronic diastolic CHF (congestive heart failure) ([place])- (present on admission) Volume status is difficult to assess in the setting of morbid obesity and lymphedema.  Diuretics are on hold in the setting of AKI, patient is getting IV fluids. Last echo in April 2022 EF 60 to 63%, grade 1 diastolic dysfunction, moderately dilated left atrium, mild LVH. -- Strict I/O's and daily weights -- Hold diuretics, resume when renal function improves --Continue metoprolol  Paroxysmal A-fib ([place])- (present on admission) Heart rate controlled.  Continue Eliquis, amiodarone and metoprolol.  Telemetry.  HTN (hypertension)- (present on admission) Patient had low blood pressures morning of 2/23, given 500 cc bolus with improvement. --Continue metoprolol.   --Diuretics are held due to AKI and dehydration. --As needed hydralazine if BP uncontrolled  HLD (hyperlipidemia)- (present on admission) Continue statin  Pressure injury of skin- (present on admission) POA.  I agree with the wound description as outlined. -- Frequent repositioning -- Diligent hygiene -- Local  wound care -- Monitor for signs of  infection Pressure Injury 04/21/21 Sacrum Stage 1 -  Intact skin with non-blanchable redness of a localized area usually over a bony prominence. (Active)  04/21/21 1546  Location: Sacrum  Location Orientation:   Staging: Stage 1 -  Intact skin with non-blanchable redness of a localized area usually over a bony prominence.  Wound Description (Comments):   Present on Admission: Yes    COPD (chronic obstructive pulmonary disease) ([place])- (present on admission) No wheezing on exam to suggest exacerbation.  Respiratory status appears stable.  Does not appear to be on any inhalers at home.  Monitor.  Lymphedema Diuretics are on hold due to AKI and dehydration.  Monitor volume status closely.  Daily weights and strict I/O's.  Nausea- (present on admission) Being followed recently by GI and diagnosed with H. pylori.  She is still on triple antibiotic therapy.  Prescribed Phenergan for persistent nausea which seems to have contributed to her encephalopathy. --Hold Phenergan -- Zofran as needed -- Continue triple antibiotics to complete 14-day course (end date 2/28) -- Continue PPI  Vertigo Hold meclizine unless needed given AMS  Chronic pain syndrome- (present on admission) Patient takes oxycodone, gabapentin and as needed Flexeril at home.  Patient has rheumatoid arthritis, OA in addition to lumbosacral DDD, cervical DJD.  2/24: Resume oxycodone at reduced dose --Continue holding gabapentin and reassess if still needed, consider discontinuing altogether.  Patient reports gabapentin helps her sleep, unclear if she has much nerve pain  Rheumatoid arthritis ([place])- (present on admission) Continue home leflunomide and prednisone  Morbid obesity with BMI of 50.0-59.9, adult ([place]) Body mass index is 52.97 kg/m.  Significantly contributes to patient's chronic pain, falls. Complicates overall care and prognosis.  Recommend lifestyle modifications including  physical activity and diet for weight loss and overall long-term health.         Subjective: Patient awake sitting up in bed, daughter at bedside when seen this morning.  Patient's mental status is back to baseline.  Patient reports inability to sleep overnight.  Today having increased back and right shoulder pain.  She had shoulder replacement in the past and feels she may have injured the shoulder one of her falls at home.  She denies fevers chills.  Reports jerking movements have resolved.  States she has recently been taking half tablets of oxycodone (10 mg instead of 20) at home.  She and daughter report that she mainly uses gabapentin to help her sleep.  Unclear how much actual nerve pain she has.  Physical Exam: Vitals:   04/22/21 0609 04/22/21 0818 04/22/21 0900 04/22/21 1212  BP: (!) 164/66 (!) 166/69 (!) 159/62 115/70  Pulse: 72 68 70 66  Resp: 18 18  18   Temp: 98.8 F (37.1 C) 97.9 F (36.6 C)  98.2 F (36.8 C)  TempSrc:    Oral  SpO2: 92% 94%  92%  Weight:      Height:       General exam: awake, alert, no acute distress, morbidly obese HEENT: moist mucus membranes, hearing grossly normal  Respiratory system: CTAB but diminished due to body habitus, no wheezes, rales or rhonchi, normal respiratory effort. Cardiovascular system: normal S1/S2, RRR, unable to assess for JVD, lower extremity pitting edema appears stable.   Gastrointestinal system: soft, NT, ND, no HSM felt, +bowel sounds. Central nervous system: A&O x4. no gross focal neurologic deficits, normal speech Extremities: Stable lower extremity edema, right anterior shoulder surgical scar well-healed without any visible swelling or erythema Skin: dry, intact, normal temperature Psychiatry:  normal mood, congruent affect, judgement and insight appear normal   Data Reviewed:  Labs reviewed and notable for chloride 94, normal potassium 4.1, glucose 121, BUN 36 down from 42, creatinine 1.55 down from 1.70, albumin  2.9, CK down to 316 from 942, hemoglobin 9.9 from 11.1.  Right shoulder x-ray without signs of fracture dislocation or hardware failure  Family Communication: Daughter at bedside on rounds today  Disposition: Status is: Inpatient Remains inpatient appropriate because: Severity of illness remaining on IV fluids for AKI          Planned Discharge Destination: Home     Time spent: 35 minutes  Author: Ezekiel Slocumb, DO 04/22/2021 2:40 PM  For on call review [email or web address].

## 2021-04-22 NOTE — Consult Note (Addendum)
[facility] Nurse Consult Note: Reason for Consult: Consult requested for buttocks and skin folds.  Sacrum was previously reported to have a Stage 1 pressure injury, but this has resolved when skin is assessed.  Pt has red moist macerated skin to buttocks, inner perineum, abd and breast folds bilat.  Appearance is consistent with moisture associated skin damage, not pressure.  There is a dark purple bruise to the upper buttocks, this is not a pressure injury.  Left outer abd fold with partial thickness red moist fissure; 3X.2X.2cm. Pt is frequently incontinent of urine prior to admission and currently has a Purewick in place to attempt to contain urine and protect the skin.  ICD-10 CM Codes for Irritant Dermatitis IL24A2 - Due to fecal, urinary or dual incontinence L30.4  - Erythema intertrigo; chafing of the skin, dermatitis due to sweating and friction, friction dermatitis, friction eczema, and genital/thigh intertrigo.   Dressing procedure/placement/frequency: Topical treatment orders provided for bedside nurses to perform as follows to promote healing and protect from further injury: Foam dressing to fissure in left abd fold; change Q 3 days or PRN soiling.  Please re-consult if further assistance is needed.  Thank-you,  Julien Girt MSN, [place], [place], [place], [place]

## 2021-04-22 NOTE — TOC Initial Note (Signed)
Transition of Care [facility]) - Initial/Assessment Note    Patient Details  Name: Tina Pacheco MRN: [id number] Date of Birth: Sep 02, 1950  [facility] CM/SW Contact:    Pete Pelt, RN Phone Number: 04/22/2021, 4:10 PM  Clinical Narrative:    Patient lives at home with spouse.  She states she had a privately paid personal care aide, but had home health "a while ago".  She states her husband assists with laundry and household chores and daughter is able to assist sometimes as well.  Patient has no concerns with transportation and reports that she is up to date with all appointments at present.  She is able to obtain her medications without concern.  Patient states she is here due to "taking too many different medications that cause drowsiness" and she fell.  She denies having medication concerns, but she states her daughter believes she needs assistance with medication management.   Therapy recommends HomeHealth.  Corene Cornea with [facility] notified for RN, PT, OT.  TOC to monitor for needs, TOC contact information provided.               Expected Discharge Plan: [place] Barriers to Discharge: Continued Medical Work up   Patient Goals and CMS Choice Patient states their goals for this hospitalization and ongoing recovery are:: To go home with home health      Expected Discharge Plan and Services Expected Discharge Plan: [place] arrangements for the past 2 months: Single Family Home                           HH Arranged: RN, PT, OT [facility] Agency: [place] ([place]) Date HH Agency Contacted: 04/22/21 Time [place]: [place] Representative spoke with at [place]: [place] Arrangements/Services Living arrangements for the past 2 months: [place] with:: Self, Spouse Patient language and need for interpreter reviewed:: Yes (No  interpreter required) Do you feel safe going back to the place where you live?: Yes (with home health)      Need for Family Participation in Patient Care: Yes (Comment) Care giver support system in place?: Yes (comment)   Criminal Activity/Legal Involvement Pertinent to Current Situation/Hospitalization: No - Comment as needed  Activities of Daily Living Home Assistive Devices/Equipment: Walker (specify type), Other (Comment) (modified tub) ADL Screening (condition at time of admission) Patient's cognitive ability adequate to safely complete daily activities?: Yes Is the patient deaf or have difficulty hearing?: No Does the patient have difficulty seeing, even when wearing glasses/contacts?: No Does the patient have difficulty concentrating, remembering, or making decisions?: No Patient able to express need for assistance with ADLs?: Yes Does the patient have difficulty dressing or bathing?: No Independently performs ADLs?: Yes (appropriate for developmental age) Does the patient have difficulty walking or climbing stairs?: Yes Weakness of Legs: Both Weakness of Arms/Hands: None  Permission Sought/Granted Permission sought to share information with : Case Manager Permission granted to share information with : Yes, Verbal Permission Granted     Permission granted to share info w AGENCY: Home Health agency-[facility]        Emotional Assessment Appearance:: Appears stated age Attitude/Demeanor/Rapport: Gracious, Engaged Affect (typically observed): Pleasant, Appropriate Orientation: : Oriented to Self, Oriented to Place, Oriented to  Time, Oriented to Situation Alcohol / Substance Use: Not Applicable Psych Involvement: No (  comment)  Admission diagnosis:  Polypharmacy [Z79.899] Acute encephalopathy [G93.40] Patient Active Problem List   Diagnosis Date Noted   Acute renal failure superimposed on stage 3a chronic kidney disease ([place]) 04/21/2021   Dehydration 04/21/2021    Hypomagnesemia 04/21/2021   Leukocytosis 04/21/2021   Frequent falls 04/21/2021   Pressure injury of skin 84/16/6063   Acute metabolic encephalopathy 01/60/1093   Tremor 03/23/2021   Sleep disturbance 03/23/2021   Closed dislocation of right shoulder 11/10/2020   Stage 3b chronic kidney disease ([place]) 07/07/2020   Demand ischemia ([place])    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/03/2020   Chronic diastolic CHF (congestive heart failure) (H[facility])    COPD (chronic obstructive pulmonary disease) ([place]) 03/30/2019   Urge incontinence of urine 12/23/2018   Left leg pain 09/18/2018   Chronic gouty arthropathy without tophi 04/01/2018   Preventative health care 03/20/2018   Mood disorder ([place]) 03/20/2018   Advance directive discussed with patient 03/20/2018   Lymphedema 11/26/2017   Nausea 05/08/2017   Narcotic dependence ([place]) 03/09/2017   Vertigo 06/28/2016   Bronchiectasis without acute exacerbation ([place]) 06/15/2015   DDD (degenerative disc disease), lumbosacral 03/30/2015   Sleep apnea    Atherosclerotic heart disease of native coronary artery with angina pectoris ([place]) 08/25/2014   Paroxysmal A-fib ([place]) 08/25/2014   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   RLS (restless legs syndrome) 07/06/2014   Gout 05/26/2014   Chronic pain syndrome 05/26/2014   Migraine 04/08/2014   Back pain 07/22/2013   Metatarsalgia of right foot 07/22/2013   Insomnia 07/15/2013   OA (osteoarthritis) 11/20/2012   DJD (degenerative joint disease) of cervical spine 09/10/2012   Allergic rhinitis 07/10/2011   Rheumatoid arthritis ([place]) 03/06/2011   Morbid obesity with BMI of 50.0-59.9, adult ([place]) 06/22/2010   HLD (hyperlipidemia) 06/22/2010   HTN (hypertension) 06/22/2010   CAD (coronary artery disease) 06/22/2010   Long term current use of anticoagulant therapy 05/27/2010   PCP:  Venia Carbon, MD Pharmacy:   [facility] [place], [place] - [address] [place] [address] Phone: [phone] Fax: [phone]  [facility] [place] [place] [address] [place] [address] Phone: [phone] Fax: [place], [place] - [address] [address] Phone: [phone] Fax: [phone]     Social Determinants of Health (SDOH) Interventions    Readmission Risk Interventions Readmission Risk Prevention Plan 04/22/2021 11/14/2020 06/01/2020  Transportation Screening Complete Complete Complete  PCP or Specialist Appt within 3-5 Days - - Complete  HRI or [place] - - Complete  Social Work Consult for [place] Planning/Counseling - - Complete  Palliative Care Screening - - Not Applicable  Medication Review Press photographer) Complete Complete (No Data)  PCP or Specialist appointment within 3-5 days of discharge Complete Complete -  HRI or Home Care Consult Complete (No Data) -  SW Recovery Care/Counseling Consult Not Complete Complete -  SW Consult Not Complete Comments RNCM assigned to patient - -  Palliative Care Screening Not Applicable Not Applicable -  [place] Not Applicable Complete -  Some recent data might be hidden

## 2021-04-22 NOTE — Progress Notes (Signed)
The patient is injury-free, afebrile, alert, and oriented X 3. Vital signs were within the baseline during this shift. She complained of chronic back and shoulder pain, which improved with tylenol and lidocaine patch. . Pt denies chest pain, SOB, dizziness, signs or symptoms of bleeding, or acute changes during this shift. We will continue to monitor and work toward achieving the care plan goals

## 2021-04-23 DIAGNOSIS — G9341 Metabolic encephalopathy: Secondary | ICD-10-CM | POA: Diagnosis not present

## 2021-04-23 LAB — CBC
HCT: 33.3 % — ABNORMAL LOW (ref 36.0–46.0)
Hemoglobin: 10.5 g/dL — ABNORMAL LOW (ref 12.0–15.0)
MCH: 29.2 pg (ref 26.0–34.0)
MCHC: 31.5 g/dL (ref 30.0–36.0)
MCV: 92.5 fL (ref 80.0–100.0)
Platelets: 218 10*3/uL (ref 150–400)
RBC: 3.6 MIL/uL — ABNORMAL LOW (ref 3.87–5.11)
RDW: 15.9 % — ABNORMAL HIGH (ref 11.5–15.5)
WBC: 6.2 10*3/uL (ref 4.0–10.5)
nRBC: 0 % (ref 0.0–0.2)

## 2021-04-23 LAB — CK: Total CK: 125 U/L (ref 38–234)

## 2021-04-23 LAB — BASIC METABOLIC PANEL
Anion gap: 9 (ref 5–15)
BUN: 25 mg/dL — ABNORMAL HIGH (ref 8–23)
CO2: 26 mmol/L (ref 22–32)
Calcium: 8.7 mg/dL — ABNORMAL LOW (ref 8.9–10.3)
Chloride: 99 mmol/L (ref 98–111)
Creatinine, Ser: 1.2 mg/dL — ABNORMAL HIGH (ref 0.44–1.00)
GFR, Estimated: 48 mL/min — ABNORMAL LOW (ref >60)
Glucose, Bld: 129 mg/dL — ABNORMAL HIGH (ref 70–99)
Potassium: 4.4 mmol/L (ref 3.5–5.1)
Sodium: 134 mmol/L — ABNORMAL LOW (ref 135–145)

## 2021-04-23 LAB — MAGNESIUM: Magnesium: 1.8 mg/dL (ref 1.7–2.4)

## 2021-04-23 LAB — CALCIUM, IONIZED: Calcium, Ionized, Serum: 5.6 mg/dL (ref 4.5–5.6)

## 2021-04-23 MED ORDER — TERAZOSIN HCL 5 MG PO CAPS
10.0000 mg | ORAL_CAPSULE | Freq: Every day | ORAL | Status: DC
  Filled 2021-04-23: qty 2

## 2021-04-23 MED ORDER — TORSEMIDE 20 MG PO TABS
40.0000 mg | ORAL_TABLET | Freq: Every day | ORAL | Status: DC
Start: 2021-04-23 — End: 2021-04-23
  Administered 2021-04-23: 40 mg via ORAL
  Filled 2021-04-23: qty 2

## 2021-04-23 MED ORDER — SPIRONOLACTONE 25 MG PO TABS
25.0000 mg | ORAL_TABLET | Freq: Every day | ORAL | Status: DC
Start: 2021-04-23 — End: 2021-04-23
  Administered 2021-04-23: 25 mg via ORAL
  Filled 2021-04-23: qty 1

## 2021-04-23 MED ORDER — GABAPENTIN 300 MG PO CAPS: 300.0000 mg | ORAL_CAPSULE | Freq: Every evening | ORAL | 3 refills | Status: DC | PRN

## 2021-04-23 MED ORDER — CYCLOBENZAPRINE HCL 10 MG PO TABS: 10.0000 mg | ORAL_TABLET | Freq: Three times a day (TID) | ORAL | 0 refills | Status: DC | PRN

## 2021-04-23 NOTE — Progress Notes (Signed)
Patient has been discharged home.  Discharge papers given and explained to patient. She verbalized understanding.  Meds and f/u appointments reviewed.

## 2021-04-23 NOTE — Discharge Summary (Signed)
Physician Discharge Summary   Patient: Tina Pacheco MRN: [id number] DOB: 09/19/1950  Admit date:     04/20/2021  Discharge date: 04/28/21  Discharge Physician: Ezekiel Slocumb   PCP: Venia Carbon, MD   Recommendations at discharge:    Follow up with PCP in 1-2 weeks Recheck BMP/Mg/CBC in 1-2 weeks Follow up with GI as scheduled   Discharge Diagnoses: Principal Problem:   Acute metabolic encephalopathy Active Problems:   Morbid obesity with BMI of 50.0-59.9, adult (HCC)   HLD (hyperlipidemia)   HTN (hypertension)   Rheumatoid arthritis (HCC)   Chronic pain syndrome   Paroxysmal A-fib (HCC)   Vertigo   Nausea   Lymphedema   COPD (chronic obstructive pulmonary disease) (HCC)   Chronic diastolic CHF (congestive heart failure) (HCC)   Acute renal failure superimposed on stage 3a chronic kidney disease (HCC)   Dehydration   Hypomagnesemia   Leukocytosis   Frequent falls   Pressure injury of skin    Hospital Course: 71 y.o. female with PMH of chronic diastolic heart failure, hyperlipidemia, essential hypertension, paroxysmal atrial fibrillation chronically anticoagulated on Eliquis, stage IIIa chronic kidney disease with baseline creatinine 1.1-1.4, chronic pain on opioid therapy, recently diagnosed H pylori infection still completing triple antibiotic course per GI.  She presented to the ED on 04/20/2021 with worsening altered mental status for about one week including confusion and somnolence/lethargy with poor PO intake.  By history family provided and chart review, onset of patient's symptoms seemed to coincide with being prescribed phenergan for persistent nausea while also continuing oxycodone and gabapentin in the setting of AKI.   Resolved: acute metabolic encephalopathy, AKI  Assessment and Plan: * Acute metabolic encephalopathy Present on admission, most likely due to polypharmacy with oxycodone, scheduled gabapentin, recent addition of Phenergan for nausea  in addition to worsening renal function and poor clearance.  No evidence of infection were found.  Ammonia level was normal.  Head CT was negative for any acute findings.  VBG without hypercapnia. -- Held oxycodone, Flexeril, gabapentin, Phenergan on admission -- Manage AKI as below -- Delirium and fall precautions  2/24: Mental status is returned to baseline.  Slowly reintroducing held medications starting with oxycodone at lower dose.  2/25: mental status stable on home meds resumed yesterday. Tolerating pain med and gabapentin well with renal function recovered.  Pressure injury of skin POA.  I agree with the wound description as outlined. -- Frequent repositioning -- Diligent hygiene -- Local wound care -- Monitor for signs of infection Pressure Injury 04/21/21 Sacrum Stage 1 -  Intact skin with non-blanchable redness of a localized area usually over a bony prominence. (Active)  04/21/21 1546  Location: Sacrum  Location Orientation:   Staging: Stage 1 -  Intact skin with non-blanchable redness of a localized area usually over a bony prominence.  Wound Description (Comments):   Present on Admission: Yes    Frequent falls Due to polypharmacy and acute renal failure -- Fall precautions -- PT OT evaluations - HH recommended -- Minimize sedating medications -- Right shoulder x-ray today negative for any hardware failure fracture or dislocation  Leukocytosis Present on admission with WBC 10.9, resolved.  No evidence of infection.  Monitor CBC and clinically for signs and symptoms of infection.  Hypomagnesemia Magnesium replaced on 2/23 for Mg 1.5.  Monitor Mg and replace as needed.  Dehydration On IV fluids as outlined.  Due to poor p.o. intake in the setting of encephalopathy.  Diuretics held, resumed at d/c.  Acute renal failure superimposed on stage 3a chronic kidney disease (HCC) Baseline creatinine 1.1-1.4.  AKI present on admission with creatinine 1.88 in the setting of  poor oral intake with encephalopathy and signs of dehydration. -- Treated with IV fluids and improved 2/24: Cr improved to 1.55  2/25: Cr 1.20 -- Held home spironolactone, torsemide - resume at discharge, monitor BP -- Resumed oxycodone and gabapentin once Cr improved, tolerating well -- Monitor BMP  Chronic diastolic CHF (congestive heart failure) (HCC) Volume status is difficult to assess in the setting of morbid obesity and lymphedema.  Diuretics are on hold in the setting of AKI, patient is getting IV fluids. Last echo in April 2022 EF 60 to 50%, grade 1 diastolic dysfunction, moderately dilated left atrium, mild LVH. -- Strict I/O's and daily weights -- Held diuretics, resume at d/c, Cr recovered --Continue metoprolol  COPD (chronic obstructive pulmonary disease) (HCC) No wheezing on exam to suggest exacerbation.  Respiratory status appears stable.  Does not appear to be on any inhalers at home.  Monitor.  Lymphedema Diuretics are on hold due to AKI and dehydration.  Monitor volume status closely.  Daily weights and strict I/O's.  Nausea Being followed recently by GI and diagnosed with H. pylori.  She is still on triple antibiotic therapy.  Prescribed Phenergan for persistent nausea which seems to have contributed to her encephalopathy. --Hold Phenergan -- Zofran as needed -- Continue triple antibiotics to complete 14-day course (end date 2/28) -- Continue PPI  Vertigo Hold meclizine unless needed given AMS  Paroxysmal A-fib (HCC) Heart rate controlled.  Continue Eliquis, amiodarone and metoprolol.  Telemetry.  Chronic pain syndrome Patient takes oxycodone, gabapentin and as needed Flexeril at home.  Patient has rheumatoid arthritis, OA in addition to lumbosacral DDD, cervical DJD.  2/24: Resume oxycodone at reduced dose --Continue holding gabapentin and reassess if still needed, consider discontinuing altogether.  Patient reports gabapentin helps her sleep, unclear if she  has much nerve pain  2/25: seems tolerating resumed oxycodone and gabapentin, mental status stable  Rheumatoid arthritis ([place]) Continue home leflunomide and prednisone  HTN (hypertension) Patient had low blood pressures morning of 2/23, given 500 cc bolus with improvement. --Continue metoprolol.   --Diuretics are held due to AKI and dehydration. --As needed hydralazine if BP uncontrolled  HLD (hyperlipidemia) Continue statin  Morbid obesity with BMI of 50.0-59.9, adult (HCC) Body mass index is 52.97 kg/m.  Significantly contributes to patient's chronic pain, falls. Complicates overall care and prognosis.  Recommend lifestyle modifications including physical activity and diet for weight loss and overall long-term health.            Consultants: none Procedures performed: none  Disposition: Home health Diet recommendation:  Discharge Diet Orders (From admission, onward)     Start     Ordered   04/23/21 0000  Diet - low sodium heart healthy        04/23/21 0925           Cardiac diet  DISCHARGE MEDICATION: Allergies as of 04/23/2021       Reactions   Fluoxetine Other (See Comments)   Headache, shaking, sleep issues Headache, shaking, sleep issues   Codeine    Nausea and vomiting/only when taking too much        Medication List     STOP taking these medications    promethazine 12.5 MG tablet Commonly known as: PHENERGAN       TAKE these medications    amiodarone 200 MG  tablet Commonly known as: PACERONE Take 1 tablet (200 mg total) by mouth at bedtime. GIVE ONE TABLET BY AT BEDTIME   apixaban 5 MG Tabs tablet Commonly known as: Eliquis TAKE 1 TABLET(5 MG) BY MOUTH TWICE DAILY   atorvastatin 40 MG tablet Commonly known as: LIPITOR Take 1 tablet (40 mg total) by mouth daily.   bisacodyl 10 MG suppository Commonly known as: DULCOLAX Place 10 mg rectally as needed for moderate constipation.   cyclobenzaprine 10 MG tablet Commonly known  as: FLEXERIL Take 1 tablet (10 mg total) by mouth 3 (three) times daily as needed for muscle spasms. What changed:  when to take this reasons to take this   gabapentin 300 MG capsule Commonly known as: NEURONTIN Take 1 capsule (300 mg total) by mouth at bedtime as needed. What changed:  when to take this reasons to take this   ketoconazole 2 % cream Commonly known as: NIZORAL APPLY A SMALL AMOUNT TO AFFECTED AREA ONCE A DAY TO RASH ON BUTTOCKS, GROIN   leflunomide 20 MG tablet Commonly known as: ARAVA Take 1 tablet (20 mg total) by mouth daily.   meclizine 25 MG tablet Commonly known as: ANTIVERT TAKE 1 TABLET(25 MG) BY MOUTH THREE TIMES DAILY AS NEEDED FOR DIZZINESS   metolazone 2.5 MG tablet Commonly known as: ZAROXOLYN TAKE 1 TABLET BY MOUTH DAILY AS NEEDED MAY GIVE 1 TABLET AS NEEDED FOR WEIGHT GAIN OF 5 LBS IN 1 WEEK   metoprolol succinate 50 MG 24 hr tablet Commonly known as: TOPROL-XL TAKE 1 TABLET BY MOUTH EVERY DAY WITH OR IMMEDIATELY FOLLOWING A MEAL   montelukast 10 MG tablet Commonly known as: SINGULAIR TAKE 1 TABLET BY MOUTH AT BEDTIME   nitroGLYCERIN 0.4 MG SL tablet Commonly known as: NITROSTAT Place 1 tablet (0.4 mg total) under the tongue every 5 (five) minutes as needed for chest pain.   NON FORMULARY Diet:Heart healthy   nystatin powder Commonly known as: MYCOSTATIN/NYSTOP Apply 1 application topically 3 (three) times daily as needed.   omeprazole 40 MG capsule Commonly known as: PRILOSEC Take 1 capsule (40 mg total) by mouth daily.   ondansetron 4 MG disintegrating tablet Commonly known as: ZOFRAN-ODT Take 1 tablet (4 mg total) by mouth every 8 (eight) hours as needed for nausea or vomiting.   Oxycodone HCl 20 MG Tabs Take 1 tablet (20 mg total) by mouth in the morning and at bedtime.   polyethylene glycol 17 g packet Commonly known as: MIRALAX / GLYCOLAX Take 17 g by mouth daily as needed for mild constipation.   Potassium Chloride  ER 20 MEQ Tbcr Take 20 mEq by mouth daily at 12 noon. What changed:  how much to take when to take this   predniSONE 5 MG tablet Commonly known as: DELTASONE TAKE 3-4 TABLETS BY MOUTH DAILY WITH BREAKFAST. WEAN AS DIRECTED   RA SALINE ENEMA RE Place rectally as needed.   SF 5000 Plus 1.1 % Crea dental cream Generic drug: sodium fluoride Place 1 application onto teeth 2 (two) times daily.   spironolactone 25 MG tablet Commonly known as: ALDACTONE TAKE 1 TABLET(25 MG) BY MOUTH DAILY   terazosin 10 MG capsule Commonly known as: HYTRIN TAKE 1 CAPSULE(10 MG) BY MOUTH AT BEDTIME   torsemide 20 MG tablet Commonly known as: DEMADEX TAKE 2 TABLETS BY MOUTH DAILY FOR CONGESTIVE HEART FAILURE       ASK your doctor about these medications    amoxicillin 500 MG tablet Commonly known as: AMOXIL  Take 2 tablets (1,000 mg total) by mouth 2 (two) times daily for 14 days. Ask about: Should I take this medication?   clarithromycin 500 MG tablet Commonly known as: BIAXIN Take 1 tablet (500 mg total) by mouth 2 (two) times daily for 14 days. Ask about: Should I take this medication?   metroNIDAZOLE 500 MG tablet Commonly known as: FLAGYL Take 1 tablet (500 mg total) by mouth 2 (two) times daily for 14 days. Ask about: Should I take this medication?         Discharge Exam: Filed Weights   04/20/21 1740 04/23/21 0448  Weight: 135.6 kg (!) 136.5 kg   General exam: awake, alert, no acute distress, obese HEENT: atraumatic, clear conjunctiva, anicteric sclera, moist mucus membranes, hearing grossly normal  Respiratory system: normal respiratory effort. Cardiovascular system: normal S1/S2, RRR, b/l pitting edema stable to slightly progressed   Gastrointestinal system: soft, NT, ND, no HSM felt, +bowel sounds. Central nervous system: A&O x4. no gross focal neurologic deficits, normal speech Extremities: moves all , no cyanosis, normal tone Skin: dry, intact, normal  temperature Psychiatry: normal mood, congruent affect, judgement and insight appear normal   Condition at discharge: stable  The results of significant diagnostics from this hospitalization (including imaging, microbiology, ancillary and laboratory) are listed below for reference.   Imaging Studies: CT ABDOMEN PELVIS WO CONTRAST  Result Date: 04/20/2021 CLINICAL DATA:  Weakness and multiple falls. EXAM: CT ABDOMEN AND PELVIS WITHOUT CONTRAST TECHNIQUE: Multidetector CT imaging of the abdomen and pelvis was performed following the standard protocol without IV contrast. RADIATION DOSE REDUCTION: This exam was performed according to the departmental dose-optimization program which includes automated exposure control, adjustment of the mA and/or kV according to patient size and/or use of iterative reconstruction technique. COMPARISON:  January 16, 2018 FINDINGS: Lower chest: No acute abnormality. Hepatobiliary: No focal liver abnormality is seen. Status post cholecystectomy. No biliary dilatation. Pancreas: Unremarkable. No pancreatic ductal dilatation or surrounding inflammatory changes. Spleen: Normal in size without focal abnormality. Adrenals/Urinary Tract: Adrenal glands are unremarkable. Kidneys are normal in size, without renal calculi or hydronephrosis. A subcentimeter exophytic cyst is seen along the posterior aspect of the mid left kidney. Bladder is unremarkable. Stomach/Bowel: Stomach is within normal limits. Appendix appears normal. No evidence of bowel wall thickening, distention, or inflammatory changes. Vascular/Lymphatic: Aortic atherosclerosis. No enlarged abdominal or pelvic lymph nodes. Reproductive: Status post hysterectomy. No adnexal masses. Other: No abdominal wall hernia or abnormality. No abdominopelvic ascites. Musculoskeletal: Multilevel marked severity degenerative changes seen throughout the lumbar spine. IMPRESSION: 1. Evidence of prior cholecystectomy and hysterectomy. 2.  Multilevel marked severity degenerative changes throughout the lumbar spine. 3. Aortic atherosclerosis. Aortic Atherosclerosis (ICD10-I70.0). Electronically Signed   By: Virgina Norfolk M.D.   On: 04/20/2021 21:50   CT HEAD WO CONTRAST (5MM)  Result Date: 04/20/2021 CLINICAL DATA:  Altered mental status and weakness with multiple falls. EXAM: CT HEAD WITHOUT CONTRAST TECHNIQUE: Contiguous axial images were obtained from the base of the skull through the vertex without intravenous contrast. RADIATION DOSE REDUCTION: This exam was performed according to the departmental dose-optimization program which includes automated exposure control, adjustment of the mA and/or kV according to patient size and/or use of iterative reconstruction technique. COMPARISON:  April 18, 2021 FINDINGS: Brain: There is mild cerebral atrophy with widening of the extra-axial spaces and ventricular dilatation. There are areas of decreased attenuation within the white matter tracts of the supratentorial brain, consistent with microvascular disease changes. Vascular: No hyperdense vessel or  unexpected calcification. Skull: Normal. Negative for fracture or focal lesion. Sinuses/Orbits: No acute finding. Other: None. IMPRESSION: 1. No acute intracranial abnormality. 2. Generalized cerebral atrophy and chronic small vessel white matter ischemic changes. Electronically Signed   By: Virgina Norfolk M.D.   On: 04/20/2021 21:55   CT HEAD WO CONTRAST (5MM)  Result Date: 04/18/2021 CLINICAL DATA:  Fall. Dizziness. Head trauma, minor (Age >= 65y); Neck trauma (Age >= 65y) EXAM: CT HEAD WITHOUT CONTRAST CT CERVICAL SPINE WITHOUT CONTRAST TECHNIQUE: Multidetector CT imaging of the head and cervical spine was performed following the standard protocol without intravenous contrast. Multiplanar CT image reconstructions of the cervical spine were also generated. RADIATION DOSE REDUCTION: This exam was performed according to the departmental  dose-optimization program which includes automated exposure control, adjustment of the mA and/or kV according to patient size and/or use of iterative reconstruction technique. COMPARISON:  12/09/2019, CT shoulder 11/10/2020 FINDINGS: CT HEAD FINDINGS Brain: No evidence of acute infarction, hemorrhage, hydrocephalus, extra-axial collection or mass lesion/mass effect. Extensive low-density changes within the periventricular and subcortical white matter compatible with chronic microvascular ischemic change, similar to prior. Mild diffuse cerebral volume loss. Vascular: Atherosclerotic calcifications involving the large vessels of the skull base. No unexpected hyperdense vessel. Skull: Normal. Negative for fracture or focal lesion. Sinuses/Orbits: Mild mucosal thickening of the paranasal sinuses. Air-fluid level within the left sphenoid sinus. Mastoid air cells are clear. Other: None. CT CERVICAL SPINE FINDINGS Alignment: Facet joints are aligned without dislocation or traumatic listhesis. Dens and lateral masses are aligned. Skull base and vertebrae: No evidence of acute fracture. Subtle superior endplate depression at T2 appears unchanged compared to prior CT of 11/10/2020. Soft tissues and spinal canal: No prevertebral fluid or swelling. No visible canal hematoma. Disc levels:  Multilevel facet-predominant cervical spondylosis. Upper chest: Areas of ground-glass attenuation within the bilateral lung apices. Other: None. IMPRESSION: CT head: 1. No acute intracranial abnormality. 2. Chronic microvascular ischemic change and mild diffuse cerebral volume loss. 3. Air-fluid level within the left sphenoid sinus, which may represent acute sinusitis. CT cervical spine: 1. No acute fracture or subluxation of the cervical spine. 2. Multilevel cervical spondylosis. 3. Areas of ground-glass attenuation within the bilateral lung apices, which may represent an infectious or inflammatory process. Dedicated chest radiographs  recommended for further assessment. Electronically Signed   By: Davina Poke D.O.   On: 04/18/2021 14:22   CT Cervical Spine Wo Contrast  Result Date: 04/20/2021 CLINICAL DATA:  Weakness and multiple falls with altered mental status. EXAM: CT CERVICAL SPINE WITHOUT CONTRAST TECHNIQUE: Multidetector CT imaging of the cervical spine was performed without intravenous contrast. Multiplanar CT image reconstructions were also generated. RADIATION DOSE REDUCTION: This exam was performed according to the departmental dose-optimization program which includes automated exposure control, adjustment of the mA and/or kV according to patient size and/or use of iterative reconstruction technique. COMPARISON:  April 18, 2021 FINDINGS: Alignment: Normal. Skull base and vertebrae: No acute fracture. Degenerative changes seen along the tip of the dens. No primary bone lesion or focal pathologic process. Soft tissues and spinal canal: No prevertebral fluid or swelling. No visible canal hematoma. Disc levels: Mild multilevel endplate sclerosis is seen throughout the cervical spine with mild anterior osteophyte formation seen at the levels of C2-C3 and C6-C7. There is marked severity narrowing of the anterior atlantoaxial articulation. Mild multilevel intervertebral disc space narrowing is seen at the levels of C2-C3, C3-C4 and C4-C5. Mild to moderate severity intervertebral disc space narrowing is seen at C5-C6  and C6-C7. Bilateral marked severity multilevel facet joint hypertrophy is noted. Upper chest: Negative. Other: None. IMPRESSION: 1. No acute fracture or subluxation of the cervical spine. 2. Multilevel degenerative changes, as described above. Electronically Signed   By: Virgina Norfolk M.D.   On: 04/20/2021 21:53   CT Cervical Spine Wo Contrast  Result Date: 04/18/2021 CLINICAL DATA:  Fall. Dizziness. Head trauma, minor (Age >= 65y); Neck trauma (Age >= 65y) EXAM: CT HEAD WITHOUT CONTRAST CT CERVICAL SPINE  WITHOUT CONTRAST TECHNIQUE: Multidetector CT imaging of the head and cervical spine was performed following the standard protocol without intravenous contrast. Multiplanar CT image reconstructions of the cervical spine were also generated. RADIATION DOSE REDUCTION: This exam was performed according to the departmental dose-optimization program which includes automated exposure control, adjustment of the mA and/or kV according to patient size and/or use of iterative reconstruction technique. COMPARISON:  12/09/2019, CT shoulder 11/10/2020 FINDINGS: CT HEAD FINDINGS Brain: No evidence of acute infarction, hemorrhage, hydrocephalus, extra-axial collection or mass lesion/mass effect. Extensive low-density changes within the periventricular and subcortical white matter compatible with chronic microvascular ischemic change, similar to prior. Mild diffuse cerebral volume loss. Vascular: Atherosclerotic calcifications involving the large vessels of the skull base. No unexpected hyperdense vessel. Skull: Normal. Negative for fracture or focal lesion. Sinuses/Orbits: Mild mucosal thickening of the paranasal sinuses. Air-fluid level within the left sphenoid sinus. Mastoid air cells are clear. Other: None. CT CERVICAL SPINE FINDINGS Alignment: Facet joints are aligned without dislocation or traumatic listhesis. Dens and lateral masses are aligned. Skull base and vertebrae: No evidence of acute fracture. Subtle superior endplate depression at T2 appears unchanged compared to prior CT of 11/10/2020. Soft tissues and spinal canal: No prevertebral fluid or swelling. No visible canal hematoma. Disc levels:  Multilevel facet-predominant cervical spondylosis. Upper chest: Areas of ground-glass attenuation within the bilateral lung apices. Other: None. IMPRESSION: CT head: 1. No acute intracranial abnormality. 2. Chronic microvascular ischemic change and mild diffuse cerebral volume loss. 3. Air-fluid level within the left sphenoid  sinus, which may represent acute sinusitis. CT cervical spine: 1. No acute fracture or subluxation of the cervical spine. 2. Multilevel cervical spondylosis. 3. Areas of ground-glass attenuation within the bilateral lung apices, which may represent an infectious or inflammatory process. Dedicated chest radiographs recommended for further assessment. Electronically Signed   By: Davina Poke D.O.   On: 04/18/2021 14:22   DG Chest Portable 1 View  Result Date: 04/20/2021 CLINICAL DATA:  Weakness and multiple falls. EXAM: PORTABLE CHEST 1 VIEW COMPARISON:  April 18, 2021 FINDINGS: Limited study secondary to patient rotation. The cardiac silhouette is mildly enlarged and unchanged in size. Both lungs are clear. An intact right shoulder replacement is noted. The visualized skeletal structures are otherwise unremarkable. IMPRESSION: Stable exam without acute cardiopulmonary disease. Electronically Signed   By: Virgina Norfolk M.D.   On: 04/20/2021 21:46   DG Chest Portable 1 View  Result Date: 04/18/2021 CLINICAL DATA:  Weakness, fall. EXAM: PORTABLE CHEST 1 VIEW COMPARISON:  Chest x-ray dated 11/13/2020 FINDINGS: Stable cardiomegaly. Coarse lung markings bilaterally. Chronic bronchitic changes noted centrally. No new lung findings to suggest a developing pneumonia or pulmonary edema. No pleural effusion or pneumothorax is seen. No acute-appearing osseous abnormality. RIGHT shoulder arthroplasty hardware in place. IMPRESSION: 1. No acute findings. No evidence of pneumonia or pulmonary edema. 2. Stable cardiomegaly. 3. Chronic bronchitic changes and probable chronic interstitial lung disease. Electronically Signed   By: Franki Cabot M.D.   On: 04/18/2021 14:47  DG Shoulder Right Port  Result Date: 04/22/2021 CLINICAL DATA:  History of right shoulder arthroplasty. EXAM: RIGHT SHOULDER - 1 VIEW COMPARISON:  11/16/2020 FINDINGS: Postsurgical changes are again seen of reverse total right shoulder  arthroplasty. Interval removal of surgical skin staples. Normal alignment. No acute fracture or dislocation. No perihardware lucency. Normal alignment of the acromioclavicular joint. IMPRESSION: Status post reverse total right shoulder arthroplasty without evidence of hardware failure. Electronically Signed   By: Yvonne Kendall M.D.   On: 04/22/2021 12:06    Microbiology: Results for orders placed or performed during the hospital encounter of 04/20/21  Resp Panel by RT-PCR (Flu A&B, Covid) Nasopharyngeal Swab     Status: None   Collection Time: 04/20/21 11:26 PM   Specimen: Nasopharyngeal Swab; Nasopharyngeal(NP) swabs in vial transport medium  Result Value Ref Range Status   SARS Coronavirus 2 by RT PCR NEGATIVE NEGATIVE Final    Comment: (NOTE) SARS-CoV-2 target nucleic acids are NOT DETECTED.  The SARS-CoV-2 RNA is generally detectable in upper respiratory specimens during the acute phase of infection. The lowest concentration of SARS-CoV-2 viral copies this assay can detect is 138 copies/mL. A negative result does not preclude SARS-Cov-2 infection and should not be used as the sole basis for treatment or other patient management decisions. A negative result may occur with  improper specimen collection/handling, submission of specimen other than nasopharyngeal swab, presence of viral mutation(s) within the areas targeted by this assay, and inadequate number of viral copies(<138 copies/mL). A negative result must be combined with clinical observations, patient history, and epidemiological information. The expected result is Negative.  Fact Sheet for Patients:  EntrepreneurPulse.com.au  Fact Sheet for Healthcare Providers:  IncredibleEmployment.be  This test is no t yet approved or cleared by the [place] FDA and  has been authorized for detection and/or diagnosis of SARS-CoV-2 by FDA under an Emergency Use Authorization (EUA). This EUA will  remain  in effect (meaning this test can be used) for the duration of the COVID-19 declaration under Section 564(b)(1) of the Act, 21 U.S.C.section 360bbb-3(b)(1), unless the authorization is terminated  or revoked sooner.       Influenza A by PCR NEGATIVE NEGATIVE Final   Influenza B by PCR NEGATIVE NEGATIVE Final    Comment: (NOTE) The Xpert Xpress SARS-CoV-2/FLU/RSV plus assay is intended as an aid in the diagnosis of influenza from Nasopharyngeal swab specimens and should not be used as a sole basis for treatment. Nasal washings and aspirates are unacceptable for Xpert Xpress SARS-CoV-2/FLU/RSV testing.  Fact Sheet for Patients: EntrepreneurPulse.com.au  Fact Sheet for Healthcare Providers: IncredibleEmployment.be  This test is not yet approved or cleared by the [place] FDA and has been authorized for detection and/or diagnosis of SARS-CoV-2 by FDA under an Emergency Use Authorization (EUA). This EUA will remain in effect (meaning this test can be used) for the duration of the COVID-19 declaration under Section 564(b)(1) of the Act, 21 U.S.C. section 360bbb-3(b)(1), unless the authorization is terminated or revoked.  Performed at [facility], [address] [place], [place] [address]     Labs: CBC: Recent Labs  Lab 04/22/21 0523 04/23/21 0519  WBC 8.3 6.2  HGB 9.9* 10.5*  HCT 30.4* 33.3*  MCV 90.5 92.5  PLT 193 008   Basic Metabolic Panel: Recent Labs  Lab 04/22/21 0523 04/23/21 0519  NA 136 134*  K 4.1 4.4  CL 94* 99  CO2 31 26  GLUCOSE 121* 129*  BUN 36* 25*  CREATININE 1.55*  1.20*  CALCIUM 9.2 8.7*  MG 1.9 1.8   Liver Function Tests: Recent Labs  Lab 04/22/21 0523  AST 34  ALT 19  ALKPHOS 61  BILITOT 1.3*  PROT 5.6*  ALBUMIN 2.9*   CBG: No results for input(s): GLUCAP in the last 168 hours.  Discharge time spent: greater than 30 minutes.  Signed: Ezekiel Slocumb, DO [facility] 04/28/2021

## 2021-04-25 ENCOUNTER — Inpatient Hospital Stay: Payer: Medicare Other | Admitting: Internal Medicine

## 2021-04-25 ENCOUNTER — Telehealth: Payer: Self-pay

## 2021-04-25 NOTE — Telephone Encounter (Signed)
Transition Care Management Follow-up Telephone Call Date of discharge and from where: TCM DC [facility] 04-23-21 Dx: acute metabolic encephalopathy How have you been since you were released from the hospital? [facility] but still a little sick on my stomach  Any questions or concerns? No  Items Reviewed: Did the pt receive and understand the discharge instructions provided? Yes  Medications obtained and verified? Yes  Other? No  Any new allergies since your discharge? No  Dietary orders reviewed? Yes Do you have support at home? Yes   Home Care and Equipment/Supplies: Were home health services ordered? no If so, what is the name of the agency? na  Has the agency set up a time to come to the patient's home? not applicable Were any new equipment or medical supplies ordered?  No What is the name of the medical supply agency? na Were you able to get the supplies/equipment? not applicable Do you have any questions related to the use of the equipment or supplies? No  Functional Questionnaire: (I = Independent and D = Dependent) ADLs: I  Bathing/Dressing- I  Meal Prep- I  Eating- I  Maintaining continence- I  Transferring/Ambulation- I  Managing Meds- I  Follow up appointments reviewed:  PCP Hospital f/u appt confirmed? Yes  Scheduled to see Alma Friendly NP on 05-04-21 @ 1220pm. [facility] f/u appt confirmed? No  . Are transportation arrangements needed? No  If their condition worsens, is the pt aware to call PCP or go to the Emergency Dept.? Yes Was the patient provided with contact information for the PCP's office or ED? Yes Was to pt encouraged to call back with questions or concerns? Yes

## 2021-04-28 ENCOUNTER — Other Ambulatory Visit: Payer: Self-pay

## 2021-04-28 ENCOUNTER — Ambulatory Visit (HOSPITAL_COMMUNITY): Payer: Medicare Other

## 2021-04-28 DIAGNOSIS — G894 Chronic pain syndrome: Secondary | ICD-10-CM

## 2021-04-28 MED ORDER — OXYCODONE HCL 20 MG PO TABS: 1.0000 | ORAL_TABLET | Freq: Two times a day (BID) | ORAL | 0 refills | Status: DC

## 2021-04-28 NOTE — Telephone Encounter (Signed)
Name of Medication: oxycodone 20 mg ?Name of Pharmacy: [facility] [address] ?Last Fill or Written Date and Quantity: # 60 on 03/22/21 ?Last Office Visit and Type: 03/23/21 acute ?Next Office Visit and Type: 05/04/21 HFU ?Last Controlled Substance Agreement Date: 11/04/2020 ?Last UDS:04/20/21 ? ? ?

## 2021-04-28 NOTE — Telephone Encounter (Signed)
Patient is requesting refill on oxycodone. [place]. Please advise

## 2021-04-29 ENCOUNTER — Ambulatory Visit (INDEPENDENT_AMBULATORY_CARE_PROVIDER_SITE_OTHER): Payer: Medicare Other

## 2021-04-29 DIAGNOSIS — I890 Lymphedema, not elsewhere classified: Secondary | ICD-10-CM

## 2021-04-29 DIAGNOSIS — Z9181 History of falling: Secondary | ICD-10-CM

## 2021-04-29 DIAGNOSIS — I5032 Chronic diastolic (congestive) heart failure: Secondary | ICD-10-CM

## 2021-04-29 DIAGNOSIS — I480 Paroxysmal atrial fibrillation: Secondary | ICD-10-CM

## 2021-04-29 NOTE — Patient Instructions (Signed)
Visit Information ? ?Thank you for taking time to visit with me today. Please don't hesitate to contact me if I can be of assistance to you before our next scheduled telephone appointment. ? ?Following are the goals we discussed today:  ?Patient will self administer medications as prescribed as evidenced by self report/primary caregiver report  ?Patient will attend all scheduled provider appointments as evidenced by clinician review of documented attendance to scheduled appointments and patient/caregiver report ?Patient will call pharmacy for medication refills as evidenced by patient report and review of pharmacy fill history as appropriate ?weigh myself daily ?- check pulse (heart) rate once a day and record ?Call your doctor for new, ongoing or worsening symptoms.  ?Continue to weigh daily.  Notify provider for weight gain of 3 lbs overnight and 5 lbs in a week.  ?Monitor blood pressure at least 1-2 times per week.  Notify provider for blood pressure concerns.  ?Continue to follow a low salt diet ?Elevate legs when sitting.  ?Follow fall prevention strategies:  Use your assistive device ( cane or walker) if advised by your doctor, Make surer walkways are clear of clutter, cords, and throw rugs,  Make sure there is good lighting throughout your home.  ? ?Our next appointment is by telephone on 06/03/2021 at 1:00 pm ? ?Please call the care guide team at [phone] if you need to cancel or reschedule your appointment.  ? ?If you are experiencing a Mental Health or [place] or need someone to talk to, please call the Suicide and Crisis Lifeline: 988 ?call 1-800-273-TALK (toll free, 24 hour hotline)  ? ?Patient verbalizes understanding of instructions and care plan provided today and agrees to view in [place]. Active MyChart status confirmed with patient.   ? ?Quinn Plowman RN,BSN,CCM ?RN Case Manager ?[place]  ?[phone] ? ?

## 2021-04-29 NOTE — Chronic Care Management (AMB) (Signed)
Chronic Care Management   CCM RN Visit Note  04/29/2021 Name: Tina Pacheco MRN: [id number] DOB: 30-May-1950  Subjective: Tina Pacheco is a 71 y.o. year old female who is a primary care patient of Venia Carbon, MD. The care management team was consulted for assistance with disease management and care coordination needs.    Engaged with patient by telephone for follow up visit in response to provider referral for case management and/or care coordination services.   Consent to Services:  The patient was given information about Chronic Care Management services, agreed to services, and gave verbal consent prior to initiation of services.  Please see initial visit note for detailed documentation.   Patient agreed to services and verbal consent obtained.   Assessment: Review of patient past medical history, allergies, medications, health status, including review of consultants reports, laboratory and other test data, was performed as part of comprehensive evaluation and provision of chronic care management services.   SDOH (Social Determinants of Health) assessments and interventions performed:    CCM Care Plan  Allergies  Allergen Reactions   Fluoxetine Other (See Comments)    Headache, shaking, sleep issues Headache, shaking, sleep issues   Codeine     Nausea and vomiting/only when taking too much    Outpatient Encounter Medications as of 04/29/2021  Medication Sig Note   amiodarone (PACERONE) 200 MG tablet Take 1 tablet (200 mg total) by mouth at bedtime. GIVE ONE TABLET BY AT BEDTIME    apixaban (ELIQUIS) 5 MG TABS tablet TAKE 1 TABLET(5 MG) BY MOUTH TWICE DAILY    atorvastatin (LIPITOR) 40 MG tablet Take 1 tablet (40 mg total) by mouth daily.    bisacodyl (DULCOLAX) 10 MG suppository Place 10 mg rectally as needed for moderate constipation.    ketoconazole (NIZORAL) 2 % cream APPLY A SMALL AMOUNT TO AFFECTED AREA ONCE A DAY TO RASH ON BUTTOCKS, GROIN    leflunomide (ARAVA)  20 MG tablet Take 1 tablet (20 mg total) by mouth daily.    meclizine (ANTIVERT) 25 MG tablet TAKE 1 TABLET(25 MG) BY MOUTH THREE TIMES DAILY AS NEEDED FOR DIZZINESS    metolazone (ZAROXOLYN) 2.5 MG tablet TAKE 1 TABLET BY MOUTH DAILY AS NEEDED MAY GIVE 1 TABLET AS NEEDED FOR WEIGHT GAIN OF 5 LBS IN 1 WEEK    metoprolol succinate (TOPROL-XL) 50 MG 24 hr tablet TAKE 1 TABLET BY MOUTH EVERY DAY WITH OR IMMEDIATELY FOLLOWING A MEAL    montelukast (SINGULAIR) 10 MG tablet TAKE 1 TABLET BY MOUTH AT BEDTIME    nitroGLYCERIN (NITROSTAT) 0.4 MG SL tablet Place 1 tablet (0.4 mg total) under the tongue every 5 (five) minutes as needed for chest pain.    nystatin (MYCOSTATIN/NYSTOP) powder Apply 1 application topically 3 (three) times daily as needed.    omeprazole (PRILOSEC) 40 MG capsule Take 1 capsule (40 mg total) by mouth daily.    ondansetron (ZOFRAN-ODT) 4 MG disintegrating tablet Take 1 tablet (4 mg total) by mouth every 8 (eight) hours as needed for nausea or vomiting.    Oxycodone HCl 20 MG TABS Take 1 tablet (20 mg total) by mouth in the morning and at bedtime.    polyethylene glycol (MIRALAX / GLYCOLAX) packet Take 17 g by mouth daily as needed for mild constipation.    Potassium Chloride ER 20 MEQ TBCR Take 20 mEq by mouth daily at 12 noon. (Patient taking differently: Take 40 mEq by mouth 2 (two) times daily.)    predniSONE (  DELTASONE) 5 MG tablet TAKE 3-4 TABLETS BY MOUTH DAILY WITH BREAKFAST. WEAN AS DIRECTED 04/21/2021: Patient takes 5 mg at bedtime   SF 5000 PLUS 1.1 % CREA dental cream Place 1 application onto teeth 2 (two) times daily.    Sodium Phosphates (RA SALINE ENEMA RE) Place rectally as needed.    spironolactone (ALDACTONE) 25 MG tablet TAKE 1 TABLET(25 MG) BY MOUTH DAILY    terazosin (HYTRIN) 10 MG capsule TAKE 1 CAPSULE(10 MG) BY MOUTH AT BEDTIME    torsemide (DEMADEX) 20 MG tablet TAKE 2 TABLETS BY MOUTH DAILY FOR CONGESTIVE HEART FAILURE    cyclobenzaprine (FLEXERIL) 10 MG  tablet Take 1 tablet (10 mg total) by mouth 3 (three) times daily as needed for muscle spasms. (Patient not taking: Reported on 04/25/2021)    gabapentin (NEURONTIN) 300 MG capsule Take 1 capsule (300 mg total) by mouth at bedtime as needed. (Patient not taking: Reported on 04/25/2021)    NON FORMULARY Diet:Heart healthy    No facility-administered encounter medications on file as of 04/29/2021.    Patient Active Problem List   Diagnosis Date Noted   Acute renal failure superimposed on stage 3a chronic kidney disease ([place]) 04/21/2021   Dehydration 04/21/2021   Hypomagnesemia 04/21/2021   Leukocytosis 04/21/2021   Frequent falls 04/21/2021   Pressure injury of skin 65/78/4696   Acute metabolic encephalopathy 29/52/8413   Tremor 03/23/2021   Sleep disturbance 03/23/2021   Closed dislocation of right shoulder 11/10/2020   Stage 3b chronic kidney disease ([place]) 07/07/2020   Demand ischemia ([place])    Polymyalgia rheumatica ([place]) 02/03/2020   Chronic diastolic CHF (congestive heart failure) (HCC)    COPD (chronic obstructive pulmonary disease) ([place]) 03/30/2019   Urge incontinence of urine 12/23/2018   Left leg pain 09/18/2018   Chronic gouty arthropathy without tophi 04/01/2018   Preventative health care 03/20/2018   Mood disorder ([place]) 03/20/2018   Advance directive discussed with patient 03/20/2018   Lymphedema 11/26/2017   Nausea 05/08/2017   Narcotic dependence ([place]) 03/09/2017   Vertigo 06/28/2016   Bronchiectasis without acute exacerbation ([place]) 06/15/2015   DDD (degenerative disc disease), lumbosacral 03/30/2015   Sleep apnea    Atherosclerotic heart disease of native coronary artery with angina pectoris ([place]) 08/25/2014   Paroxysmal A-fib ([place]) 08/25/2014   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   RLS (restless legs syndrome) 07/06/2014   Gout 05/26/2014   Chronic pain syndrome 05/26/2014   Migraine 04/08/2014   Back pain 07/22/2013   Metatarsalgia of right foot  07/22/2013   Insomnia 07/15/2013   OA (osteoarthritis) 11/20/2012   DJD (degenerative joint disease) of cervical spine 09/10/2012   Allergic rhinitis 07/10/2011   Rheumatoid arthritis ([place]) 03/06/2011   Morbid obesity with BMI of 50.0-59.9, adult ([place]) 06/22/2010   HLD (hyperlipidemia) 06/22/2010   HTN (hypertension) 06/22/2010   CAD (coronary artery disease) 06/22/2010   Long term current use of anticoagulant therapy 05/27/2010    Conditions to be addressed/monitored:Atrial Fibrillation, CHF, and Falls, Lymphedema  Care Plan : [facility] Plan of care  Updates made by Dannielle Karvonen, RN since 04/29/2021 12:00 AM     Problem: Chronic Disesease management, education and care coordination needs ( HF, A-fib, Lymphedema)   Priority: High     Long-Range Goal: Development of plan of care to address chronic disease management and care coordination needs. (HF, A-fib, Lymphedema)   Start Date: 01/10/2021  Expected End Date: 07/27/2021  Priority: High  Note:   Current Barriers:  Knowledge Deficits related to plan of care for management of Atrial Fibrillation, CHF, and Lymphedema  Chronic Disease Management support and education needs related to Atrial Fibrillation, CHF, and Lymphedema  Per chart review patient had ED visit on 04/18/21 due to fall; and hospitalization on 04/20/21 for altered mental status.   Patient states she was advised not to take the gabapentin and phenergan due to possible cause of altered mental status.  Patient reports recently purchasing a lift to assist if she falls.  She reports her nausea symptoms are slightly improved today.  Patient states she had to cancel her appointment with her gastroenterologist appointment and testing due to ongoing nauseas.  Patient reports blood pressures have ranged in the 120/70's, weight is currently 300 lbs, and pulse ranges in the 60's. Patient states she continues to elevate her legs when sitting. Denies any increase in swelling of lower  extremities   RNCM Clinical Goal(s):  Patient will verbalize basic understanding of Atrial Fibrillation, CHF, and Lymphedema  disease process and self health management plan as evidenced by the following  through collaboration with RN Care manager, provider, and care team:   -verbalize understanding of plan for management of Atrial Fibrillation, CHF, and Lymphedema -take all medications exactly as prescribed and will call provider for medication related questions -attend all scheduled medical appointments:  -continue to work with RN Care Manager to address care management and care coordination needs related to  Atrial Fibrillation, CHF, and Lymphedema Interventions: 1:1 collaboration with primary care provider regarding development and update of comprehensive plan of care as evidenced by provider attestation and co-signature Inter-disciplinary care team collaboration (see longitudinal plan of care) Evaluation of current treatment plan related to  self management and patient's adherence to plan as established by provider  Post procedure transition of care: (Short term goal- resolved) Goal on track:  Yes  Evaluation of current treatment plan related to  post procedure ( right shoulder surgery) , self-management and patient's adherence to plan as established by provider. Discussed plans with patient for ongoing care management follow up and provided patient with direct contact information for care management team Reviewed medications with patient and discussed  ; Advised patient to continue to follow treatment plan of Home health services   Advised patient to call her doctor for any new, ongoing or worsening concerns.   Lymphedema Interventions:  Goal on track:  Yes Evaluation of current treatment plan related to Atrial Fibrillation, CHF, and Lymphedema , self-management and patient's adherence to plan as established by provider. Discussed plans with patient for ongoing care management follow up  and provided patient with direct contact information for care management team Evaluation of current treatment plan related to Lymphedema and patient's adherence to plan as established by provider; Reviewed medications with patient and discussed  Reviewed scheduled/upcoming provider appointments  Discussed plans with patient for ongoing care management follow up and provided patient with direct contact information for care management team; Fall risk strategies discussed Patient advised to elevate legs when sitting,   Heart Failure Interventions: Goal on track: yes Provided education on low sodium diet; Advised patient to weigh each morning after emptying bladder; Discussed importance of daily weight and advised patient to weigh and record daily; Discussed the importance of keeping all appointments with provider;  AFIB Interventions: Goal on track: Yes Reviewed importance of adherence to anticoagulant exactly as prescribed Counseled on avoidance of NSAIDs due to increased bleeding risk with anticoagulants;  Reviewed atrial fibrillation symptoms.  Advised to notify doctor  for mild/ moderate symptoms and call 911 for severe symptoms.  Patient Goals/Self-Care Activities: Patient will self administer medications as prescribed as evidenced by self report/primary caregiver report  Patient will attend all scheduled provider appointments as evidenced by clinician review of documented attendance to scheduled appointments and patient/caregiver report Patient will call pharmacy for medication refills as evidenced by patient report and review of pharmacy fill history as appropriate weigh myself daily - check pulse (heart) rate once a day and record Call your doctor for new, ongoing or worsening symptoms.  Continue to weigh daily.  Notify provider for weight gain of 3 lbs overnight and 5 lbs in a week.  Monitor blood pressure at least 1-2 times per week.  Notify provider for blood pressure concerns.   Continue to follow a low salt diet Elevate legs when sitting.  Follow fall prevention strategies:  Use your assistive device ( cane or walker) if advised by your doctor, Make surer walkways are clear of clutter, cords, and throw rugs,  Make sure there is good lighting throughout your home.         Plan:The patient has been provided with contact information for the care management team and has been advised to call with any health related questions or concerns.  The care management team will reach out to the patient again over the next 45 days.  Quinn Plowman RN,BSN,CCM RN Case Manager [place]  [phone]

## 2021-05-04 ENCOUNTER — Other Ambulatory Visit: Payer: Self-pay

## 2021-05-04 ENCOUNTER — Ambulatory Visit (INDEPENDENT_AMBULATORY_CARE_PROVIDER_SITE_OTHER): Payer: Medicare Other | Admitting: Primary Care

## 2021-05-04 VITALS — BP 140/78 | HR 77 | Temp 98.6°F | Ht 63.0 in | Wt 310.0 lb

## 2021-05-04 DIAGNOSIS — N179 Acute kidney failure, unspecified: Secondary | ICD-10-CM

## 2021-05-04 DIAGNOSIS — N1831 Chronic kidney disease, stage 3a: Secondary | ICD-10-CM | POA: Diagnosis not present

## 2021-05-04 DIAGNOSIS — G9341 Metabolic encephalopathy: Secondary | ICD-10-CM

## 2021-05-04 DIAGNOSIS — R296 Repeated falls: Secondary | ICD-10-CM

## 2021-05-04 LAB — BASIC METABOLIC PANEL
BUN: 22 mg/dL (ref 6–23)
CO2: 29 mEq/L (ref 19–32)
Calcium: 10.7 mg/dL — ABNORMAL HIGH (ref 8.4–10.5)
Chloride: 100 mEq/L (ref 96–112)
Creatinine, Ser: 1.42 mg/dL — ABNORMAL HIGH (ref 0.40–1.20)
GFR: 37.28 mL/min — ABNORMAL LOW (ref >60.00)
Glucose, Bld: 110 mg/dL — ABNORMAL HIGH (ref 70–99)
Potassium: 4.5 mEq/L (ref 3.5–5.1)
Sodium: 139 mEq/L (ref 135–145)

## 2021-05-04 LAB — CBC
HCT: 35.6 % — ABNORMAL LOW (ref 36.0–46.0)
Hemoglobin: 11.5 g/dL — ABNORMAL LOW (ref 12.0–15.0)
MCHC: 32.5 g/dL (ref 30.0–36.0)
MCV: 92.4 fl (ref 78.0–100.0)
Platelets: 188 10*3/uL (ref 150.0–400.0)
RBC: 3.85 Mil/uL — ABNORMAL LOW (ref 3.87–5.11)
RDW: 16.5 % — ABNORMAL HIGH (ref 11.5–15.5)
WBC: 4.9 10*3/uL (ref 4.0–10.5)

## 2021-05-04 LAB — MAGNESIUM: Magnesium: 1.5 mg/dL (ref 1.5–2.5)

## 2021-05-04 LAB — METHYLMALONIC ACID, SERUM: Methylmalonic Acid, Quantitative: 507 nmol/L — ABNORMAL HIGH (ref 0–378)

## 2021-05-04 NOTE — Assessment & Plan Note (Signed)
Repeat BMP pending. ? ?Encouraged hydration with water. ?

## 2021-05-04 NOTE — Assessment & Plan Note (Signed)
Recent hospitalization for acute metabolic encephalopathy in the setting of polypharmacy and dehydration. ? ?Today she appears well, is improving.  We discussed the importance of hydration.  We also discussed to use gabapentin, cyclobenzaprine, meclizine as needed only. ? ?Do not see a referral placed for home health PT/OT, this was placed today.  Repeat labs pending including magnesium, BMP, CBC. ? ?She will follow-up with GI as recommended. ? ?Hospital notes, labs, imaging reviewed. ?

## 2021-05-04 NOTE — Assessment & Plan Note (Signed)
Repeat magnesium level pending. ?

## 2021-05-04 NOTE — Progress Notes (Signed)
Subjective:    Patient ID: Tina Pacheco, female    DOB: November 06, 1950, 71 y.o.   MRN: [id number]  HPI  HAJRA PORT is a very pleasant 71 y.o. female patient of Dr. Silvio Pate with a history of hypertension, CAD, paroxysmal A-fib, CHF, sleep apnea, COPD, rheumatoid arthritis, CKD, hyperlipidemia, recurrent falls, morbid obesity who presents today for TCM hospital follow-up.  Her daughter joins [place] today.  She presented to [facility] ED on 04/18/2021 for a fall striking her head.  She underwent CT head and cervical spine, no bleeding or fractures noted.  She underwent chest x-ray which was without acute findings.  Labs revealed elevated BNP, IV Lasix was provided and she diuresed appropriately.  She was discharged home later that evening.  She returned to [facility] ED on 04/20/2021 for altered mental status and weakness, hallucinations, several falls.  She was suspected to be experiencing delirium in the setting of Phenergan use, multiple sedating substances ,and polypharmacy. Her daughter mentioned that she was treated for H. pylori on triple antibiotic therapy and omeprazole beginning 04/12/2021.  Also a significant decline in oral intake including food and water.  Given this information, her mental status, and recurrent falls she was admitted for acute encephalopathy complicated by AKI in the setting of dehydration.  During her hospital stay she was rehydrated with IV fluids.  Medications such as oxycodone, gabapentin, Phenergan, spironolactone, torsemide, gabapentin were held.  Repeat CT head was negative for acute findings.  Venous blood gas negative for hypercapnia.  Symptoms began to improve so her medications were slowly reintroduced.  She was able to tolerate oxycodone and gabapentin, AKI improved.  She was discharged home on 04/23/2021 with recommendations for PCP follow-up, repeat BMP/magnesium/CBC, follow-up with GI, home health PT/OT.  Since her discharge home she has improved. She has not taken  gabapentin, Meclizine, cyclobenzaprine, gabapentin. She has these medications, but her family is giving these "as needed", and she's not requested any of these medication. Her family threw Phenergan away. She is taking her oxycodone twice daily, is trying reduce her dose.   She has not fallen since her hospitalization. She has yet to be contacted by home health PT or OT for which was recommended at discharge. She has yet to follow up with GI. She completed all antibiotics from H. Pylori infection, continues to take omeprazole.  She is trying to stay hydrated, family is providing her with Ensure drinks.  She denies abdominal pain, diarrhea.  Her family denies acute confusion.  Last bowel movement was yesterday, she is not taking Miralax as recommended.  She has several assistive devices at home including a cane, rollator walker, wheelchair.  She uses her walker at home.   Review of Systems  Constitutional:  Negative for fever.  Respiratory:  Negative for cough.   Gastrointestinal:  Positive for nausea. Negative for anal bleeding, constipation, diarrhea and vomiting.  Psychiatric/Behavioral:  Negative for confusion.         Past Medical History:  Diagnosis Date   Arthritis    RA   Asthma    Basal cell carcinoma    Cataract 2018   bilateral eyes; corrected with surgery   Claustrophobia    Collagen vascular disease ([place])    RA   COPD (chronic obstructive pulmonary disease) (HCC)    Diastolic dysfunction    a. echo 07/2014: EF 55-60%, no RWMA, GR2DD, mild MR, LA moderately dilated, PASP 38 mm Hg   Dyslipidemia    Headache    migraines  Hemihypertrophy    History of cardiac cath    a. cardiac cath 05/24/2010 - nonobstructive CAD   History of gout    Hyperplastic colonic polyp 2003   Hypertension    Hypokalemia    Morbid obesity (HCC)    PAF (paroxysmal atrial fibrillation) (HCC)    a. on Pradaxa; b. CHADSVASc at least 2 (HTN & female)   Rheumatoid arthritis(714.0)    Sleep apnea     a. not compliant with CPAP    Social History   Socioeconomic History   Marital status: Married    Spouse name: Not on file   Number of children: 1   Years of education: Not on file   Highest education level: Not on file  Occupational History   Occupation: Taxpayer service    Employer: [employer]    Comment: Retired 2007  Tobacco Use   Smoking status: Never    Passive exposure: Current   Smokeless tobacco: Never  Vaping Use   Vaping Use: Never used  Substance and Sexual Activity   Alcohol use: No   Drug use: No   Sexual activity: Not Currently  Other Topics Concern   Not on file  Social History Narrative   No living will   Husband, then daughter should be decision maker   Would accept resuscitation attempts   Not sure about tube feeds   Social Determinants of Health   Financial Resource Strain: Not on file  Food Insecurity: No Food Insecurity   Worried About Charity fundraiser in the Last Year: Never true   [place] in the Last Year: Never true  Transportation Needs: No Transportation Needs   Lack of Transportation (Medical): No   Lack of Transportation (Non-Medical): No  Physical Activity: Not on file  Stress: Not on file  Social Connections: Not on file  Intimate Partner Violence: Not on file    Past Surgical History:  Procedure Laterality Date   ABDOMINAL HYSTERECTOMY     APPLICATION OF WOUND VAC Right 08/09/2017   Procedure: APPLICATION OF WOUND VAC;  Surgeon: Albertine Patricia, DPM;  Location: [facility] ORS;  Service: Podiatry;  Laterality: Right;   CARDIAC CATHETERIZATION  05/24/2010   nonobstructive CAD   CATARACT EXTRACTION W/ INTRAOCULAR LENS IMPLANT Right 06/12/2016   Dr. Darleen Crocker   CATARACT EXTRACTION W/ INTRAOCULAR LENS IMPLANT Left 06/26/2016   Dr. Darleen Crocker   CESAREAN SECTION     CHOLECYSTECTOMY     COLONOSCOPY  06/12/2011   Procedure: COLONOSCOPY;  Surgeon: Juanita Craver, MD;  Location: [facility];  Service: Endoscopy;  Laterality: N/A;    COLONOSCOPY N/A 03/17/2013   Procedure: COLONOSCOPY;  Surgeon: Juanita Craver, MD;  Location: [facility];  Service: Endoscopy;  Laterality: N/A;   EYE SURGERY     FOOT ARTHRODESIS Right 07/13/2017   Procedure: ARTHRODESIS FOOT-MULTI.FUSIONS (6 JOINTS);  Surgeon: Albertine Patricia, DPM;  Location: [facility];  Service: Podiatry;  Laterality: Right;   IRRIGATION AND DEBRIDEMENT FOOT Right 08/09/2017   Procedure: IRRIGATION AND DEBRIDEMENT FOOT;  Surgeon: Albertine Patricia, DPM;  Location: [facility];  Service: Podiatry;  Laterality: Right;   KNEE ARTHROSCOPY     bilateral   LEFT HEART CATH AND CORONARY ANGIOGRAPHY N/A 06/04/2020   Procedure: LEFT HEART CATH AND CORONARY ANGIOGRAPHY;  Surgeon: Wellington Hampshire, MD;  Location: [facility];  Service: Cardiovascular;  Laterality: N/A;   REVERSE SHOULDER ARTHROPLASTY Right 11/16/2020   Procedure: REVERSE SHOULDER ARTHROPLASTY;  Surgeon: Corky Mull, MD;  Location: [facility];  Service: Orthopedics;  Laterality: Right;   SHOULDER CLOSED REDUCTION Right 11/11/2020   Procedure: CLOSED REDUCTION SHOULDER;  Surgeon: Corky Mull, MD;  Location: [facility];  Service: Orthopedics;  Laterality: Right;   SHOULDER INJECTION Right 11/11/2020   Procedure: SHOULDER INJECTION;  Surgeon: Corky Mull, MD;  Location: [facility];  Service: Orthopedics;  Laterality: Right;   SINUSOTOMY     TOOTH EXTRACTION  12/2016   VAGINAL HYSTERECTOMY      Family History  Problem Relation Age of Onset   Tuberculosis Mother    Parkinsonism Mother    Emphysema Father        smoked   Arthritis Father    Cancer Sister    Diabetes type II Sister    Breast cancer Sister    Tuberculosis Sister    Breast cancer Maternal Aunt    Colon cancer Neg Hx    Esophageal cancer Neg Hx    Pancreatic cancer Neg Hx    Stomach cancer Neg Hx     Allergies  Allergen Reactions   Fluoxetine Other (See Comments)    Headache, shaking, sleep issues Headache, shaking, sleep issues   Codeine      Nausea and vomiting/only when taking too much    Current Outpatient Medications on File Prior to Visit  Medication Sig Dispense Refill   amiodarone (PACERONE) 200 MG tablet Take 1 tablet (200 mg total) by mouth at bedtime. GIVE ONE TABLET BY AT BEDTIME 30 tablet 0   apixaban (ELIQUIS) 5 MG TABS tablet TAKE 1 TABLET(5 MG) BY MOUTH TWICE DAILY 60 tablet 0   atorvastatin (LIPITOR) 40 MG tablet Take 1 tablet (40 mg total) by mouth daily. 30 tablet 0   bisacodyl (DULCOLAX) 10 MG suppository Place 10 mg rectally as needed for moderate constipation.     ketoconazole (NIZORAL) 2 % cream APPLY A SMALL AMOUNT TO AFFECTED AREA ONCE A DAY TO RASH ON BUTTOCKS, GROIN 60 g 1   leflunomide (ARAVA) 20 MG tablet Take 1 tablet (20 mg total) by mouth daily. 30 tablet 0   meclizine (ANTIVERT) 25 MG tablet TAKE 1 TABLET(25 MG) BY MOUTH THREE TIMES DAILY AS NEEDED FOR DIZZINESS 90 tablet 5   metolazone (ZAROXOLYN) 2.5 MG tablet TAKE 1 TABLET BY MOUTH DAILY AS NEEDED MAY GIVE 1 TABLET AS NEEDED FOR WEIGHT GAIN OF 5 LBS IN 1 WEEK 30 tablet 11   metoprolol succinate (TOPROL-XL) 50 MG 24 hr tablet TAKE 1 TABLET BY MOUTH EVERY DAY WITH OR IMMEDIATELY FOLLOWING A MEAL 90 tablet 3   montelukast (SINGULAIR) 10 MG tablet TAKE 1 TABLET BY MOUTH AT BEDTIME 90 tablet 3   nitroGLYCERIN (NITROSTAT) 0.4 MG SL tablet Place 1 tablet (0.4 mg total) under the tongue every 5 (five) minutes as needed for chest pain. 20 tablet 12   NON FORMULARY Diet:Heart healthy     nystatin (MYCOSTATIN/NYSTOP) powder Apply 1 application topically 3 (three) times daily as needed. 60 g 0   omeprazole (PRILOSEC) 40 MG capsule Take 1 capsule (40 mg total) by mouth daily. 30 capsule 2   Oxycodone HCl 20 MG TABS Take 1 tablet (20 mg total) by mouth in the morning and at bedtime. 60 tablet 0   polyethylene glycol (MIRALAX / GLYCOLAX) packet Take 17 g by mouth daily as needed for mild constipation. 14 each 0   Potassium Chloride ER 20 MEQ TBCR Take 20 mEq  by mouth daily at 12 noon. (Patient taking differently: Take  40 mEq by mouth 2 (two) times daily.) 30 tablet 0   predniSONE (DELTASONE) 5 MG tablet TAKE 3-4 TABLETS BY MOUTH DAILY WITH BREAKFAST. WEAN AS DIRECTED 120 tablet 2   SF 5000 PLUS 1.1 % CREA dental cream Place 1 application onto teeth 2 (two) times daily.  1   Sodium Phosphates (RA SALINE ENEMA RE) Place rectally as needed.     spironolactone (ALDACTONE) 25 MG tablet TAKE 1 TABLET(25 MG) BY MOUTH DAILY 30 tablet 0   terazosin (HYTRIN) 10 MG capsule TAKE 1 CAPSULE(10 MG) BY MOUTH AT BEDTIME 90 capsule 3   torsemide (DEMADEX) 20 MG tablet TAKE 2 TABLETS BY MOUTH DAILY FOR CONGESTIVE HEART FAILURE 60 tablet 11   No current facility-administered medications on file prior to visit.    BP 140/78    Pulse 77    Temp 98.6 F (37 C) (Temporal)    Ht '5\' 3"'$  (1.6 m)    Wt (!) 310 lb (140.6 kg) Comment: per pt   SpO2 97%    BMI 54.91 kg/m  Objective:   Physical Exam Constitutional:      General: She is not in acute distress. Cardiovascular:     Rate and Rhythm: Normal rate and regular rhythm.  Pulmonary:     Effort: Pulmonary effort is normal.     Breath sounds: Normal breath sounds.  Abdominal:     General: Bowel sounds are normal.     Palpations: Abdomen is soft.     Tenderness: There is no abdominal tenderness.  Musculoskeletal:     Cervical back: Neck supple.  Skin:    General: Skin is warm and dry.  Neurological:     Mental Status: She is alert and oriented to person, place, and time.          Assessment & Plan:      This visit occurred during the SARS-CoV-2 public health emergency.  Safety protocols were in place, including screening questions prior to the visit, additional usage of staff PPE, and extensive cleaning of exam room while observing appropriate contact time as indicated for disinfecting solutions.

## 2021-05-04 NOTE — Patient Instructions (Signed)
You will be contacted regarding your referral to home health PT and OT.  Please let us know if you have not been contacted within a few days. ? ?Stop by the lab prior to leaving today. I will notify you of your results once received.  ? ?Follow up with GI as discussed.  ? ?It was a pleasure meeting you! ?  ? ?

## 2021-05-04 NOTE — Assessment & Plan Note (Signed)
Recent hospitalization. ? ?Referral placed for home health PT/OT for improvement in balance and strength and falls prevention. ? ?Encouraged use of walker/cane in the home. ? ?Fortunately she has not required use of her as needed medications that potentially cause confusion/drowsiness/falls. ? ?Hospital notes, labs, imaging reviewed ?

## 2021-05-11 ENCOUNTER — Telehealth: Payer: Self-pay

## 2021-05-11 NOTE — Telephone Encounter (Signed)
Lm on vm for Tina Pacheco to return call. ?

## 2021-05-11 NOTE — Telephone Encounter (Signed)
If she thinks she has completed it, let's recheck with the stool sample if she has been able to be off PPI for 2 weeks. thanks ?

## 2021-05-11 NOTE — Telephone Encounter (Signed)
-----   Message from Yevette Edwards, RN sent at 04/12/2021 11:49 AM EST ----- ?Regarding: PPI Hold ?Hold Omeprazole.  ?H. Pylori stool test due on or after 3/29. ? ?

## 2021-05-11 NOTE — Telephone Encounter (Signed)
Called and spoke with patient. She states that she thinks she completed all of the H. Pylori treatment but she is not 379% certain because she has been in and out of the hospital for falls. I told pt that I will check with you and see how you want to proceed about retesting. Pt asked that I call [name], her daughter, with your recommendations.  ?

## 2021-05-13 NOTE — Telephone Encounter (Signed)
Called and spoke with [place]. She states that pt completed the antibiotics for H. Pylori. She is aware that pt will need to begin holding her Omeprazole and can submit her stool study after 3/29. [name] is aware that she can come pick up the stool kit for the pt, I provided [name] with the lab location and hours. She knows that once pt collects the stool sample she can resume her Omeprazole. [name] mentioned that pt had no completed the barium swallow study because she was in the hospital. I gave [name] the phone number to radiology scheduling so that they can schedule barium swallow study at their convenience. Christina verbalized understanding of all information and had no concerns at the end of the call. ?

## 2021-05-13 NOTE — Telephone Encounter (Signed)
Can you check on this for me?  

## 2021-05-17 ENCOUNTER — Other Ambulatory Visit: Payer: Self-pay | Admitting: Internal Medicine

## 2021-05-22 ENCOUNTER — Other Ambulatory Visit: Payer: Self-pay | Admitting: Internal Medicine

## 2021-05-22 DIAGNOSIS — E782 Mixed hyperlipidemia: Secondary | ICD-10-CM

## 2021-05-23 ENCOUNTER — Telehealth: Payer: Self-pay | Admitting: Cardiovascular Disease

## 2021-05-23 ENCOUNTER — Ambulatory Visit (INDEPENDENT_AMBULATORY_CARE_PROVIDER_SITE_OTHER): Payer: Medicare Other | Admitting: Physician Assistant

## 2021-05-23 ENCOUNTER — Other Ambulatory Visit: Payer: Self-pay

## 2021-05-23 ENCOUNTER — Encounter: Payer: Self-pay | Admitting: Physician Assistant

## 2021-05-23 VITALS — BP 154/86 | HR 77 | Ht 63.0 in | Wt 302.0 lb

## 2021-05-23 DIAGNOSIS — I890 Lymphedema, not elsewhere classified: Secondary | ICD-10-CM

## 2021-05-23 DIAGNOSIS — Z6841 Body Mass Index (BMI) 40.0 and over, adult: Secondary | ICD-10-CM

## 2021-05-23 DIAGNOSIS — D649 Anemia, unspecified: Secondary | ICD-10-CM | POA: Diagnosis not present

## 2021-05-23 DIAGNOSIS — I5032 Chronic diastolic (congestive) heart failure: Secondary | ICD-10-CM | POA: Diagnosis not present

## 2021-05-23 DIAGNOSIS — E782 Mixed hyperlipidemia: Secondary | ICD-10-CM | POA: Diagnosis not present

## 2021-05-23 DIAGNOSIS — N183 Chronic kidney disease, stage 3 unspecified: Secondary | ICD-10-CM | POA: Diagnosis not present

## 2021-05-23 DIAGNOSIS — I480 Paroxysmal atrial fibrillation: Secondary | ICD-10-CM | POA: Diagnosis not present

## 2021-05-23 DIAGNOSIS — R5383 Other fatigue: Secondary | ICD-10-CM

## 2021-05-23 DIAGNOSIS — Z01810 Encounter for preprocedural cardiovascular examination: Secondary | ICD-10-CM

## 2021-05-23 DIAGNOSIS — I10 Essential (primary) hypertension: Secondary | ICD-10-CM | POA: Diagnosis not present

## 2021-05-23 DIAGNOSIS — I2510 Atherosclerotic heart disease of native coronary artery without angina pectoris: Secondary | ICD-10-CM | POA: Diagnosis not present

## 2021-05-23 NOTE — Patient Instructions (Addendum)
Medication Instructions:  ?Your physician has recommended you make the following change in your medication:  ? ?When you get home this afternoon take ?- Metolazone 2.5 mg ?- Torsemide 40 mg ?- Potassium 20 meq ? ?Starting tomorrow ?- INCREASE Torsemide to 40 mg twice a day for 3 days. ?- INCREASE Potassium to 20 meq twice a day for 3 days. ? ?On day for make sure to weigh your self and make note of your weight. Then  ?- REDUCE Torsemide to 40 mg daily. Take an additional 40 mg daily for weight gain >3lbs ? ? ?*If you need a refill on your cardiac medications before your next appointment, please call your pharmacy* ? ? ?Lab Work: ?None ordered ?If you have labs (blood work) drawn today and your tests are completely normal, you will receive your results only by: ?MyChart Message (if you have MyChart) OR ?A paper copy in the mail ?If you have any lab test that is abnormal or we need to change your treatment, we will call you to review the results. ? ? ?Testing/Procedures: ?None ordered ? ? ?Follow-Up: ?At [facility], you and your health needs are our priority.  As part of our continuing mission to provide you with exceptional heart care, we have created designated Provider Care Teams.  These Care Teams include your primary Cardiologist (physician) and Advanced Practice Providers (APPs -  Physician Assistants and Nurse Practitioners) who all work together to provide you with the care you need, when you need it. ? ?We recommend signing up for the patient portal called "MyChart".  Sign up information is provided on this After Visit Summary.  MyChart is used to connect with patients for Virtual Visits (Telemedicine).  Patients are able to view lab/test results, encounter notes, upcoming appointments, etc.  Non-urgent messages can be sent to your provider as well.   ?To learn more about what you can do with MyChart, go to [email or web address].   ? ?Your next appointment:   ?Next available  ? ?The format for your next  appointment:   ?In Person ? ?Provider:   ?Ida Rogue, MD{ ? ? ? ?Other Instructions ?You have been referred to  [facility]. Their office will call you to schedule the appointment. ? ?If an EGD is needed it is ok to hold Eliquis 2 days prior to your EGD. ? ?

## 2021-05-23 NOTE — Telephone Encounter (Signed)
? ?  Patient Name: Tina Pacheco  ?DOB: 1950/07/26 ?MRN: [id number] ? ?Primary Cardiologist: Ida Rogue, MD ? ?Chart reviewed as part of pre-operative protocol coverage.  ? ?Patient is on [name]'s schedule for overdue follow-up and to discuss this clearance today at 1:55pm. ? ?She is on apixaban - I will route to pharm team so that this information is available to [place], [place], at time of OV today. Pharm team - please route response to [employer]. He will be awaiting your recs with gratitude, thank you! ? ?Per our discussion, Thurmond Butts will plan to address this clearance. In the event that the patient does not show for his visit today he will route back to the preop pool. ? ? ?Charlie Pitter, PA-C ?05/23/2021, 11:41 AM ? ? ?

## 2021-05-23 NOTE — Telephone Encounter (Signed)
? ?  Pre-operative Risk Assessment  ?  ?Patient Name: Tina Pacheco  ?DOB: [birth year] ?MRN: [id number]  ? ?  ? ?Request for Surgical Clearance   ? ?Procedure:   Endoscopy  ? ?Date of Surgery:  Clearance TBD                              ?   ?Surgeon:  Havery Moros ?Surgeon's Group or Practice Name:  [facility]  ?Phone number:  [phone] ?Fax number:  [phone] ?  ?Type of Clearance Requested:   ?- Medical  ?- Pharmacy:  Hold unknown  unknown  ?  ?Type of Anesthesia:   unknown ?  ?Additional requests/questions:   Patient and daughter calling to discuss potential plan for endoscopy and needing ok to receive anesthesia ? ?REQUEST FOR CLEARANCE FROM PATIENT  ? ?Signed, ?Clarisse Gouge   ?05/23/2021, 9:46 AM  ? ?

## 2021-05-23 NOTE — Progress Notes (Signed)
? ?Cardiology Office Note   ? ?Date:  05/23/2021  ? ?ID:  Tina Pacheco, DOB 08-28-1950, MRN [id number] ? ?PCP:  Venia Carbon, MD  ?Cardiologist:  Ida Rogue, MD  ?Electrophysiologist:  None  ? ?Chief Complaint: Preoperative cardiac risk stratification ? ?History of Present Illness:  ? ?Tina Pacheco is a 71 y.o. female with history of nonobstructive CAD by LHC in 05/2020, HFpEF, PAF on Eliquis, CKD stage III, HTN, HLD, anemia, COPD, rheumatoid arthritis, fibromyalgia, falls, morbid obesity, H. pylori, and OSA who presents for preoperative cardiac evaluation for planned EGD. ? ?LHC in 2012 showed nonobstructive CAD.  She was diagnosed with A. fib in 2012.  Echo in 2016 showed an EF of 55 to 60%, no regional wall motion normalities, grade 2 diastolic dysfunction, and mild mitral regurgitation.  She was admitted in 02/2019 with weakness and multiple falls and acute on chronic diastolic CHF.  Echo showed an EF of 55 to 60%, mild LVH, grade 1 diastolic dysfunction, no regional wall motion abnormalities, mildly to moderately dilated left atrium, moderate mitral valve calcification without regurgitation or stenosis.  She was admitted in 12/2019 with volume overload and weight gain.  She was IV diuresed.  Echo showed an EF of 60 to 65%, no regional wall motion abnormalities, mild LVH, normal LV diastolic function parameters, normal RV systolic function and ventricular cavity size, trivial mitral regurgitation, and trivial aortic valve insufficiency.  She was admitted in 05/2020 with  acute hypoxic respiratory failure due to pulmonary edema in the setting of acute on chronic diastolic CHF and A. fib with RVR.  She converted to sinus rhythm with amiodarone.  Echo demonstrated an EF of 60 to 65%, no regional wall motion abnormalities, mild LVH, grade 1 diastolic dysfunction, normal RV systolic function and ventricular cavity size, and a moderately dilated left atrium.  Lexiscan MPI was abnormal with a large in size,  moderate in severity, completely reversible defect involving the mid anterior, anterolateral, and inferolateral segments as well as the apical anterior and lateral segments.  The defect was concerning for ischemia, though artifact could not be excluded.  LVEF was normal.  Attenuation corrected CT imaging showed coronary artery calcification, aortic atherosclerosis, and dense mitral annular calcification.  Overall, this was an intermediate restudy.  Given abnormal MPI, she underwent LHC on 06/04/2020 which showed no evidence of obstructive CAD with ostial to mid LAD 40% stenosis and distal LAD 30% stenosis. ? ?She was last seen in the office in 07/2020 with dramatic documented weight loss trending from 353 in 06/2020 trending to 301 in 07/2020.  Recommendation to continue torsemide 40 mg daily with an extra 40 mg as needed and hold metolazone unless weight was greater than 310 pounds.  Phone note after that visit showed to the patient was woken up on 08/03/2020 with chest pain and shortness of breath lasting approximately 45 minutes.  Patient indicated EMS came out with patient reporting EKG showed A-fib.  Note indicates she refused transport to the ED. ? ?She was admitted to the hospital in 03/2021 with acute metabolic encephalopathy felt to likely be related to polypharmacy with oxycodone, gabapentin, and recent addition of Phenergan in the context of acute on CKD.  She had fallen numerous times prior to her admission.  Head CT was negative for acute process.  Ammonia normal.  High-sensitivity troponin normal x3.  BNP 133.  EKG showed sinus rhythm without acute ST-T changes. ? ?Prior to the above admission, she has been evaluated by  GI with poor appetite and persistent nausea.  She was diagnosed with H. pylori and underwent therapy.  GI is planning for EGD.  She presents for cardiac risk stratification. ? ?She comes in today noting fatigue and progressive swelling and tightness involving her bilateral lower extremities.   No symptoms of angina or respiratory decompensation.  She reports that she was able to stand on her home scale recently with a weight of 302.  Despite this a stable weight, she feels like her lower extremities are significantly swollen.  At baseline, she sleeps in a recliner.  She does not add salt to food, though does not monitor salt content of prepared foods.  She does drink a little over 2 L of fluid daily.  She has been taking torsemide 40 mg daily with an occasional extra 20 mg.  She also reports the idea of lymphedema pumps have been discussed with her previously. ? ?Revised Cardiac Risk Index: Moderate risk for noncardiac procedure  ? ?D[facility] Activity Status Index: Unable to assess secondary to limited functional status ? ? ?Labs independently reviewed: ?04/2021 - magnesium 1.5, Hgb 11.5, PLT 188, potassium 4.5, BUN 22, serum creatinine 1.42 ?03/2021 - albumin 2.9, AST/ALT normal, TSH normal ?07/2019 - TC 113, TG 70, HDL 43, LDL 56 ? ?Past Medical History:  ?Diagnosis Date  ? Arthritis   ? RA  ? Asthma   ? Basal cell carcinoma   ? Cataract 2018  ? bilateral eyes; corrected with surgery  ? Claustrophobia   ? Collagen vascular disease ([place])   ? RA  ? COPD (chronic obstructive pulmonary disease) ([place])   ? Diastolic dysfunction   ? a. echo 07/2014: EF 55-60%, no RWMA, GR2DD, mild MR, LA moderately dilated, PASP 38 mm Hg  ? Dyslipidemia   ? Headache   ? migraines  ? Hemihypertrophy   ? History of cardiac cath   ? 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 History of gout   ? Hyperplastic colonic polyp 2003  ? Hypertension   ? Hypokalemia   ? Morbid obesity ([place])   ? PAF (paroxysmal atrial fibrillation) ([place])   ? a. on Pradaxa; b. CHADSVASc at least 2 (HTN & female)  ? Rheumatoid arthritis(714.0)   ? Sleep apnea   ? a. not compliant with CPAP  ? ? ?Past Surgical History:  ?Procedure Laterality Date  ? ABDOMINAL HYSTERECTOMY    ? APPLICATION OF WOUND VAC Right 08/09/2017  ? Procedure: APPLICATION OF WOUND VAC;   Surgeon: Albertine Patricia, DPM;  Location: [facility];  Service: Podiatry;  Laterality: Right;  ? CARDIAC CATHETERIZATION  05/24/2010  ? nonobstructive CAD  ? CATARACT EXTRACTION W/ INTRAOCULAR LENS IMPLANT Right 06/12/2016  ? Dr. Darleen Crocker  ? CATARACT EXTRACTION W/ INTRAOCULAR LENS IMPLANT Left 06/26/2016  ? Dr. Darleen Crocker  ? CESAREAN SECTION    ? CHOLECYSTECTOMY    ? COLONOSCOPY  06/12/2011  ? Procedure: COLONOSCOPY;  Surgeon: Juanita Craver, MD;  Location: [facility];  Service: Endoscopy;  Laterality: N/A;  ? COLONOSCOPY N/A 03/17/2013  ? Procedure: COLONOSCOPY;  Surgeon: Juanita Craver, MD;  Location: [facility];  Service: Endoscopy;  Laterality: N/A;  ? EYE SURGERY    ? FOOT ARTHRODESIS Right 07/13/2017  ? Procedure: ARTHRODESIS FOOT-MULTI.FUSIONS (6 JOINTS);  Surgeon: Albertine Patricia, DPM;  Location: [facility];  Service: Podiatry;  Laterality: Right;  ? IRRIGATION AND DEBRIDEMENT FOOT Right 08/09/2017  ? Procedure: IRRIGATION AND DEBRIDEMENT FOOT;  Surgeon: Albertine Patricia, DPM;  Location: [facility];  Service: Podiatry;  Laterality: Right;  ? KNEE ARTHROSCOPY    ? bilateral  ? LEFT HEART CATH AND CORONARY ANGIOGRAPHY N/A 06/04/2020  ? Procedure: LEFT HEART CATH AND CORONARY ANGIOGRAPHY;  Surgeon: Wellington Hampshire, MD;  Location: [place] [facility];  Service: Cardiovascular;  Laterality: N/A;  ? REVERSE SHOULDER ARTHROPLASTY Right 11/16/2020  ? Procedure: REVERSE SHOULDER ARTHROPLASTY;  Surgeon: Corky Mull, MD;  Location: [facility];  Service: Orthopedics;  Laterality: Right;  ? SHOULDER CLOSED REDUCTION Right 11/11/2020  ? Procedure: CLOSED REDUCTION SHOULDER;  Surgeon: Corky Mull, MD;  Location: [facility];  Service: Orthopedics;  Laterality: Right;  ? SHOULDER INJECTION Right 11/11/2020  ? Procedure: SHOULDER INJECTION;  Surgeon: Corky Mull, MD;  Location: [facility];  Service: Orthopedics;  Laterality: Right;  ? SINUSOTOMY    ? TOOTH EXTRACTION  12/2016  ? VAGINAL HYSTERECTOMY    ? ? ?Current  Medications: ?Current Meds  ?Medication Sig  ? amiodarone (PACERONE) 200 MG tablet Take 1 tablet (200 mg total) by mouth at bedtime. GIVE ONE TABLET BY AT BEDTIME  ? apixaban (ELIQUIS) 5 MG TABS tablet TAKE 1 TABLET(5 MG

## 2021-05-23 NOTE — Telephone Encounter (Signed)
Patient with diagnosis of atrial fibrillation on Eliquis for anticoagulation.   ? ?Procedure: endoscopy ?Date of procedure: TBD ? ? ?CHA2DS2-VASc Score = 5  ? This indicates a 7.2% annual risk of stroke. ?The patient's score is based upon: ?CHF History: 1 ?HTN History: 1 ?Diabetes History: 0 ?Stroke History: 0 ?Vascular Disease History: 1 ?Age Score: 1 ?Gender Score: 1 ?  ? ?CrCl 50 ?Platelet count 188 ? ?Per office protocol, patient can hold Elliquis for 2 days prior to procedure.   ?Patient will not need bridging with Lovenox (enoxaparin) around procedure. ? ? ? ?

## 2021-05-24 NOTE — Telephone Encounter (Signed)
Dr Silvio Pate please address m,multiple referral requests.  ? ?I am still working on [facility] placement ?

## 2021-05-25 ENCOUNTER — Ambulatory Visit
Admission: RE | Admit: 2021-05-25 | Discharge: 2021-05-25 | Disposition: A | Discharge status: Home / Self Care | Payer: Medicare Other | Source: Ambulatory Visit | Attending: Gastroenterology | Admitting: Gastroenterology

## 2021-05-25 ENCOUNTER — Other Ambulatory Visit: Payer: Self-pay

## 2021-05-25 ENCOUNTER — Telehealth: Payer: Self-pay

## 2021-05-25 ENCOUNTER — Other Ambulatory Visit: Payer: Self-pay | Admitting: Gastroenterology

## 2021-05-25 DIAGNOSIS — R1310 Dysphagia, unspecified: Secondary | ICD-10-CM | POA: Insufficient documentation

## 2021-05-25 DIAGNOSIS — K5900 Constipation, unspecified: Secondary | ICD-10-CM | POA: Diagnosis not present

## 2021-05-25 DIAGNOSIS — R630 Anorexia: Secondary | ICD-10-CM | POA: Diagnosis not present

## 2021-05-25 DIAGNOSIS — R110 Nausea: Secondary | ICD-10-CM

## 2021-05-25 DIAGNOSIS — I4891 Unspecified atrial fibrillation: Secondary | ICD-10-CM | POA: Insufficient documentation

## 2021-05-25 DIAGNOSIS — K224 Dyskinesia of esophagus: Secondary | ICD-10-CM | POA: Diagnosis not present

## 2021-05-25 DIAGNOSIS — K219 Gastro-esophageal reflux disease without esophagitis: Secondary | ICD-10-CM | POA: Diagnosis not present

## 2021-05-25 NOTE — Telephone Encounter (Signed)
-----   Message from Yevette Edwards, RN sent at 04/12/2021 11:49 AM EST ----- ?Regarding: H. pylori ?H. Pylori stool antigen, order is in epic. ? ?

## 2021-05-25 NOTE — Telephone Encounter (Signed)
Pt has been accepted by [facility]) ?Start of Care is scheduled for 05/27/21 ? ?Pt has been notified ?

## 2021-05-25 NOTE — Telephone Encounter (Signed)
My chart message sent to patient with lab reminder. 

## 2021-05-25 NOTE — Telephone Encounter (Signed)
Pt has reviewed lab reminder via my chart. ?

## 2021-05-26 DIAGNOSIS — Z20822 Contact with and (suspected) exposure to covid-19: Secondary | ICD-10-CM | POA: Diagnosis not present

## 2021-05-27 DIAGNOSIS — I130 Hypertensive heart and chronic kidney disease with heart failure and stage 1 through stage 4 chronic kidney disease, or unspecified chronic kidney disease: Secondary | ICD-10-CM | POA: Diagnosis not present

## 2021-05-27 DIAGNOSIS — I5032 Chronic diastolic (congestive) heart failure: Secondary | ICD-10-CM | POA: Diagnosis not present

## 2021-05-27 DIAGNOSIS — J449 Chronic obstructive pulmonary disease, unspecified: Secondary | ICD-10-CM | POA: Diagnosis not present

## 2021-05-27 DIAGNOSIS — Z7901 Long term (current) use of anticoagulants: Secondary | ICD-10-CM | POA: Diagnosis not present

## 2021-05-27 DIAGNOSIS — I480 Paroxysmal atrial fibrillation: Secondary | ICD-10-CM

## 2021-05-27 DIAGNOSIS — N1831 Chronic kidney disease, stage 3a: Secondary | ICD-10-CM | POA: Diagnosis not present

## 2021-05-27 DIAGNOSIS — N179 Acute kidney failure, unspecified: Secondary | ICD-10-CM | POA: Diagnosis not present

## 2021-05-27 DIAGNOSIS — M069 Rheumatoid arthritis, unspecified: Secondary | ICD-10-CM | POA: Diagnosis not present

## 2021-05-27 DIAGNOSIS — Z79891 Long term (current) use of opiate analgesic: Secondary | ICD-10-CM | POA: Diagnosis not present

## 2021-05-27 DIAGNOSIS — Z6841 Body Mass Index (BMI) 40.0 and over, adult: Secondary | ICD-10-CM | POA: Diagnosis not present

## 2021-05-27 DIAGNOSIS — Z9181 History of falling: Secondary | ICD-10-CM | POA: Diagnosis not present

## 2021-05-27 DIAGNOSIS — G9341 Metabolic encephalopathy: Secondary | ICD-10-CM | POA: Diagnosis not present

## 2021-05-27 DIAGNOSIS — Z7722 Contact with and (suspected) exposure to environmental tobacco smoke (acute) (chronic): Secondary | ICD-10-CM | POA: Diagnosis not present

## 2021-05-27 DIAGNOSIS — G2581 Restless legs syndrome: Secondary | ICD-10-CM | POA: Diagnosis not present

## 2021-05-27 DIAGNOSIS — I2510 Atherosclerotic heart disease of native coronary artery without angina pectoris: Secondary | ICD-10-CM | POA: Diagnosis not present

## 2021-05-27 DIAGNOSIS — L89892 Pressure ulcer of other site, stage 2: Secondary | ICD-10-CM | POA: Diagnosis not present

## 2021-05-28 ENCOUNTER — Other Ambulatory Visit: Payer: Self-pay | Admitting: Adult Health

## 2021-05-28 DIAGNOSIS — N1831 Chronic kidney disease, stage 3a: Secondary | ICD-10-CM | POA: Diagnosis not present

## 2021-05-28 DIAGNOSIS — I5032 Chronic diastolic (congestive) heart failure: Secondary | ICD-10-CM | POA: Diagnosis not present

## 2021-05-28 DIAGNOSIS — G9341 Metabolic encephalopathy: Secondary | ICD-10-CM | POA: Diagnosis not present

## 2021-05-28 DIAGNOSIS — I130 Hypertensive heart and chronic kidney disease with heart failure and stage 1 through stage 4 chronic kidney disease, or unspecified chronic kidney disease: Secondary | ICD-10-CM | POA: Diagnosis not present

## 2021-05-28 DIAGNOSIS — M069 Rheumatoid arthritis, unspecified: Secondary | ICD-10-CM

## 2021-05-28 DIAGNOSIS — N179 Acute kidney failure, unspecified: Secondary | ICD-10-CM | POA: Diagnosis not present

## 2021-05-28 DIAGNOSIS — L89892 Pressure ulcer of other site, stage 2: Secondary | ICD-10-CM | POA: Diagnosis not present

## 2021-05-30 ENCOUNTER — Telehealth: Payer: Self-pay | Admitting: *Deleted

## 2021-05-30 ENCOUNTER — Telehealth: Payer: Self-pay | Admitting: Internal Medicine

## 2021-05-30 NOTE — Telephone Encounter (Signed)
Tina Pacheco, [place] ?[place] ? ?Nursing has been added to plan of care ? ?Has a stage 2 pressure ulcer on right thigh ? ?Boarded foam dressing and change twice weekly ? ?Not bad, but needs protection ? ? ?

## 2021-05-30 NOTE — Telephone Encounter (Signed)
Gave orders to [place] ?

## 2021-05-30 NOTE — Telephone Encounter (Signed)
Herbert Pun, OT with [facility] requesting VO on a few things. ? ?1: needs VO for nursing Eval, and PT eval ?2. Needs VO for OT 1x week for 6 weeks to work on [employer], range of motions, exercise, and balance ?3. When reviewing pt's med list with pt they found a level 2 interaction between 2 meds. It's pt's potassium and pt's spironolactone they wanted to see if PCP wants to change meds or is he okay with pt taking both meds ? ?Herbert Pun CB# [phone]  ?

## 2021-05-31 NOTE — Telephone Encounter (Signed)
Gave verbal orders to [employer]. ?

## 2021-06-01 ENCOUNTER — Telehealth: Payer: Self-pay | Admitting: *Deleted

## 2021-06-01 DIAGNOSIS — N1831 Chronic kidney disease, stage 3a: Secondary | ICD-10-CM | POA: Diagnosis not present

## 2021-06-01 DIAGNOSIS — I5032 Chronic diastolic (congestive) heart failure: Secondary | ICD-10-CM | POA: Diagnosis not present

## 2021-06-01 DIAGNOSIS — G9341 Metabolic encephalopathy: Secondary | ICD-10-CM | POA: Diagnosis not present

## 2021-06-01 DIAGNOSIS — L89892 Pressure ulcer of other site, stage 2: Secondary | ICD-10-CM | POA: Diagnosis not present

## 2021-06-01 DIAGNOSIS — I130 Hypertensive heart and chronic kidney disease with heart failure and stage 1 through stage 4 chronic kidney disease, or unspecified chronic kidney disease: Secondary | ICD-10-CM | POA: Diagnosis not present

## 2021-06-01 DIAGNOSIS — N179 Acute kidney failure, unspecified: Secondary | ICD-10-CM | POA: Diagnosis not present

## 2021-06-01 NOTE — Chronic Care Management (AMB) (Signed)
?  Care Management  ? ?Note ? ?06/01/2021 ?Name: Tina Pacheco MRN: [id number] DOB: 04/10/1950 ? ?Tina Pacheco is a 71 y.o. year old female who is a primary care patient of Venia Carbon, MD and is actively engaged with the care management team. I reached out to [name] by phone today to assist with re-scheduling a follow up visit with the RN Case Manager ? ?Follow up plan: ?Telephone appointment with care management team member scheduled for: 06/09/2021 ? ?Eliabeth Shoff, CCMA ?Care Guide, Embedded Care Coordination ?[place]  Care Management  ?Direct Dial: [phone] ? ? ?

## 2021-06-01 NOTE — Chronic Care Management (AMB) (Signed)
?  Care Management  ? ?Note ? ?06/01/2021 ?Name: Tina Pacheco MRN: [id number] DOB: 06/08/1950 ? ?Tina Pacheco is a 71 y.o. year old female who is a primary care patient of Venia Carbon, MD and is actively engaged with the care management team. I reached out to [name] by phone today to assist with re-scheduling a follow up visit with the RN Case Manager ? ?Follow up plan: ?Unsuccessful telephone outreach attempt made. A HIPAA compliant phone message was left for the patient providing contact information and requesting a return call.  ? ?Justyce Yeater, CCMA ?Care Guide, Embedded Care Coordination ?[place]  Care Management  ?Direct Dial: [phone] ? ? ?

## 2021-06-02 ENCOUNTER — Telehealth: Payer: Self-pay | Admitting: Internal Medicine

## 2021-06-02 DIAGNOSIS — G9341 Metabolic encephalopathy: Secondary | ICD-10-CM | POA: Diagnosis not present

## 2021-06-02 DIAGNOSIS — N179 Acute kidney failure, unspecified: Secondary | ICD-10-CM | POA: Diagnosis not present

## 2021-06-02 DIAGNOSIS — I5032 Chronic diastolic (congestive) heart failure: Secondary | ICD-10-CM | POA: Diagnosis not present

## 2021-06-02 DIAGNOSIS — L89892 Pressure ulcer of other site, stage 2: Secondary | ICD-10-CM | POA: Diagnosis not present

## 2021-06-02 DIAGNOSIS — N1831 Chronic kidney disease, stage 3a: Secondary | ICD-10-CM | POA: Diagnosis not present

## 2021-06-02 DIAGNOSIS — I130 Hypertensive heart and chronic kidney disease with heart failure and stage 1 through stage 4 chronic kidney disease, or unspecified chronic kidney disease: Secondary | ICD-10-CM | POA: Diagnosis not present

## 2021-06-02 NOTE — Telephone Encounter (Signed)
Home Health verbal orders ?Caller Name:Brianne ?Agency Name: [facility] ? ?Callback number: [phone] ? ?Requesting OT/PT/Skilled nursing/Social Work/Speech: ? ?Reason:PT ? ?Frequency:1 wk for 6 wks ? ?Please forward to [facility] pool or providers CMA  ?

## 2021-06-02 NOTE — Telephone Encounter (Signed)
Spoke to [employer]. Gave verbal orders. ?

## 2021-06-03 ENCOUNTER — Telehealth: Payer: Medicare Other

## 2021-06-03 DIAGNOSIS — G9341 Metabolic encephalopathy: Secondary | ICD-10-CM | POA: Diagnosis not present

## 2021-06-03 DIAGNOSIS — I130 Hypertensive heart and chronic kidney disease with heart failure and stage 1 through stage 4 chronic kidney disease, or unspecified chronic kidney disease: Secondary | ICD-10-CM | POA: Diagnosis not present

## 2021-06-03 DIAGNOSIS — I5032 Chronic diastolic (congestive) heart failure: Secondary | ICD-10-CM | POA: Diagnosis not present

## 2021-06-03 DIAGNOSIS — L89892 Pressure ulcer of other site, stage 2: Secondary | ICD-10-CM | POA: Diagnosis not present

## 2021-06-03 DIAGNOSIS — N1831 Chronic kidney disease, stage 3a: Secondary | ICD-10-CM | POA: Diagnosis not present

## 2021-06-03 DIAGNOSIS — N179 Acute kidney failure, unspecified: Secondary | ICD-10-CM | POA: Diagnosis not present

## 2021-06-06 ENCOUNTER — Other Ambulatory Visit: Payer: Self-pay | Admitting: Internal Medicine

## 2021-06-06 DIAGNOSIS — I5032 Chronic diastolic (congestive) heart failure: Secondary | ICD-10-CM

## 2021-06-07 DIAGNOSIS — I5032 Chronic diastolic (congestive) heart failure: Secondary | ICD-10-CM | POA: Diagnosis not present

## 2021-06-07 DIAGNOSIS — N179 Acute kidney failure, unspecified: Secondary | ICD-10-CM | POA: Diagnosis not present

## 2021-06-07 DIAGNOSIS — G9341 Metabolic encephalopathy: Secondary | ICD-10-CM | POA: Diagnosis not present

## 2021-06-07 DIAGNOSIS — I130 Hypertensive heart and chronic kidney disease with heart failure and stage 1 through stage 4 chronic kidney disease, or unspecified chronic kidney disease: Secondary | ICD-10-CM | POA: Diagnosis not present

## 2021-06-07 DIAGNOSIS — L89892 Pressure ulcer of other site, stage 2: Secondary | ICD-10-CM | POA: Diagnosis not present

## 2021-06-07 DIAGNOSIS — N1831 Chronic kidney disease, stage 3a: Secondary | ICD-10-CM | POA: Diagnosis not present

## 2021-06-09 ENCOUNTER — Telehealth: Payer: Self-pay | Admitting: Internal Medicine

## 2021-06-09 ENCOUNTER — Ambulatory Visit (INDEPENDENT_AMBULATORY_CARE_PROVIDER_SITE_OTHER): Payer: Medicare Other

## 2021-06-09 DIAGNOSIS — R296 Repeated falls: Secondary | ICD-10-CM

## 2021-06-09 DIAGNOSIS — I890 Lymphedema, not elsewhere classified: Secondary | ICD-10-CM

## 2021-06-09 DIAGNOSIS — L89892 Pressure ulcer of other site, stage 2: Secondary | ICD-10-CM | POA: Diagnosis not present

## 2021-06-09 DIAGNOSIS — G9341 Metabolic encephalopathy: Secondary | ICD-10-CM | POA: Diagnosis not present

## 2021-06-09 DIAGNOSIS — I130 Hypertensive heart and chronic kidney disease with heart failure and stage 1 through stage 4 chronic kidney disease, or unspecified chronic kidney disease: Secondary | ICD-10-CM | POA: Diagnosis not present

## 2021-06-09 DIAGNOSIS — N179 Acute kidney failure, unspecified: Secondary | ICD-10-CM | POA: Diagnosis not present

## 2021-06-09 DIAGNOSIS — I480 Paroxysmal atrial fibrillation: Secondary | ICD-10-CM

## 2021-06-09 DIAGNOSIS — I5032 Chronic diastolic (congestive) heart failure: Secondary | ICD-10-CM

## 2021-06-09 DIAGNOSIS — N1831 Chronic kidney disease, stage 3a: Secondary | ICD-10-CM | POA: Diagnosis not present

## 2021-06-09 NOTE — Telephone Encounter (Signed)
Tina Pacheco scheduled pt for 06/10/21 ?

## 2021-06-09 NOTE — Telephone Encounter (Signed)
Anissa with [facility] called stating that pt legs is now red and warm to the touch. Please advise. ?

## 2021-06-09 NOTE — Patient Instructions (Signed)
Visit Information ? ?Thank you for taking time to visit with me today. Please don't hesitate to contact me if I can be of assistance to you before our next scheduled telephone appointment. ? ?Following are the goals we discussed today:  ?Continue to take medications as prescribed and refill timely ?Attend scheduled provider appointments  ?Continue to weight daily and record. Notify provider for weight gain of 3 lbs overnight and 5 lbs in a week.  ?- check pul ? se (heart) rate once a day and record ?Call your doctor for new, ongoing or worsening symptoms.  ?Monitor blood pressure at least 1-2 times per week.  Notify provider for blood pressure concerns.  ?Continue to follow a low salt diet ?Elevate legs when sitting.  ?Attend 06/10/21 at 9:15am primary care provider appointment.  ?Follow fall prevention strategies:  Use your assistive device ( cane or walker) if advised by your doctor, Make surer walkways are clear of clutter, cords, and throw rugs,  Make sure there is good lighting throughout your home.  ? ?Our next appointment is by telephone on 06/10/21 at 9:15 am ? ?Please call the care guide team at [phone] if you need to cancel or reschedule your appointment.  ? ?If you are experiencing a Mental Health or [place] or need someone to talk to, please call the Suicide and Crisis Lifeline: 988 ?call 1-800-273-TALK (toll free, 24 hour hotline)  ? ?Patient verbalizes understanding of instructions and care plan provided today and agrees to view in [place]. Active MyChart status confirmed with patient.   ? ?Quinn Plowman RN,BSN,CCM ?RN Case Manager ?[place]  ?[phone] ? ?

## 2021-06-09 NOTE — Chronic Care Management (AMB) (Addendum)
?Chronic Care Management  ? ?CCM RN Visit Note ? ?06/09/2021 ?Name: Tina Pacheco MRN: [id number] DOB: 11-14-1950 ? ?Subjective: ?Tina Pacheco is a 71 y.o. year old female who is a primary care patient of Venia Carbon, MD. The care management team was consulted for assistance with disease management and care coordination needs.   ? ?Engaged with patient by telephone for follow up visit in response to provider referral for case management and/or care coordination services.  ? ?Consent to Services:  ?The patient was given information about Chronic Care Management services, agreed to services, and gave verbal consent prior to initiation of services.  Please see initial visit note for detailed documentation.  ? ?Patient agreed to services and verbal consent obtained.  ? ?Assessment: Review of patient past medical history, allergies, medications, health status, including review of consultants reports, laboratory and other test data, was performed as part of comprehensive evaluation and provision of chronic care management services.  ? ?SDOH (Social Determinants of Health) assessments and interventions performed:   ? ?CCM Care Plan ? ?Allergies  ?Allergen Reactions  ? Fluoxetine Other (See Comments)  ?  Headache, shaking, sleep issues ?Headache, shaking, sleep issues  ? Codeine   ?  Nausea and vomiting/only when taking too much  ? ? ?Outpatient Encounter Medications as of 06/09/2021  ?Medication Sig Note  ? amiodarone (PACERONE) 200 MG tablet Take 1 tablet (200 mg total) by mouth at bedtime. GIVE ONE TABLET BY AT BEDTIME   ? apixaban (ELIQUIS) 5 MG TABS tablet TAKE 1 TABLET(5 MG) BY MOUTH TWICE DAILY   ? atorvastatin (LIPITOR) 40 MG tablet TAKE 1 TABLET(40 MG) BY MOUTH DAILY   ? ketoconazole (NIZORAL) 2 % cream APPLY A SMALL AMOUNT TO AFFECTED AREA ONCE A DAY TO RASH ON BUTTOCKS, GROIN   ? leflunomide (ARAVA) 20 MG tablet Take 1 tablet (20 mg total) by mouth daily.   ? meclizine (ANTIVERT) 25 MG tablet TAKE 1  TABLET(25 MG) BY MOUTH THREE TIMES DAILY AS NEEDED FOR DIZZINESS   ? metolazone (ZAROXOLYN) 2.5 MG tablet TAKE 1 TABLET BY MOUTH DAILY AS NEEDED MAY GIVE 1 TABLET AS NEEDED FOR WEIGHT GAIN OF 5 LBS IN 1 WEEK   ? metoprolol succinate (TOPROL-XL) 50 MG 24 hr tablet TAKE 1 TABLET BY MOUTH EVERY DAY WITH OR IMMEDIATELY FOLLOWING A MEAL   ? montelukast (SINGULAIR) 10 MG tablet TAKE 1 TABLET BY MOUTH AT BEDTIME   ? nitroGLYCERIN (NITROSTAT) 0.4 MG SL tablet Place 1 tablet (0.4 mg total) under the tongue every 5 (five) minutes as needed for chest pain.   ? nystatin (MYCOSTATIN/NYSTOP) powder Apply 1 application topically 3 (three) times daily as needed.   ? Oxycodone HCl 20 MG TABS Take 1 tablet (20 mg total) by mouth in the morning and at bedtime.   ? polyethylene glycol (MIRALAX / GLYCOLAX) packet Take 17 g by mouth daily as needed for mild constipation.   ? Potassium Chloride ER 20 MEQ TBCR Take 20 mEq by mouth daily at 12 noon. (Patient taking differently: Take 40 mEq by mouth 2 (two) times daily.) 06/09/2021: Patient takes one per day except on days she takes metolazone and torsemide she takes two tablets.  ? SF 5000 PLUS 1.1 % CREA dental cream Place 1 application onto teeth 2 (two) times daily.   ? Sodium Phosphates (RA SALINE ENEMA RE) Place rectally as needed.   ? spironolactone (ALDACTONE) 25 MG tablet TAKE 1 TABLET(25 MG) BY MOUTH DAILY   ?  terazosin (HYTRIN) 10 MG capsule TAKE 1 CAPSULE(10 MG) BY MOUTH AT BEDTIME   ? torsemide (DEMADEX) 20 MG tablet TAKE 2 TABLETS BY MOUTH DAILY FOR CONGESTIVE HEART FAILURE   ? bisacodyl (DULCOLAX) 10 MG suppository Place 10 mg rectally as needed for moderate constipation. (Patient not taking: Reported on 06/09/2021)   ? NON FORMULARY Diet:Heart healthy   ? omeprazole (PRILOSEC) 40 MG capsule Take 1 capsule (40 mg total) by mouth daily. (Patient not taking: Reported on 05/23/2021)   ? predniSONE (DELTASONE) 5 MG tablet TAKE 3-4 TABLETS BY MOUTH DAILY WITH BREAKFAST. WEAN AS  DIRECTED (Patient not taking: Reported on 05/23/2021) 04/21/2021: Patient takes 5 mg at bedtime  ? ?No facility-administered encounter medications on file as of 06/09/2021.  ? ? ?Patient Active Problem List  ? Diagnosis Date Noted  ? Acute renal failure superimposed on stage 3a chronic kidney disease ([place]) 04/21/2021  ? Dehydration 04/21/2021  ? Hypomagnesemia 04/21/2021  ? Leukocytosis 04/21/2021  ? Recurrent falls 04/21/2021  ? Pressure injury of skin 04/21/2021  ? Acute metabolic encephalopathy 03/50/0938  ? Tremor 03/23/2021  ? Sleep disturbance 03/23/2021  ? Closed dislocation of right shoulder 11/10/2020  ? Stage 3b chronic kidney disease ([place]) 07/07/2020  ? Demand ischemia ([place])   ? Polymyalgia rheumatica ([place]) 02/03/2020  ? Chronic diastolic CHF (congestive heart failure) ([place])   ? COPD (chronic obstructive pulmonary disease) ([place]) 03/30/2019  ? Urge incontinence of urine 12/23/2018  ? Left leg pain 09/18/2018  ? Chronic gouty arthropathy without tophi 04/01/2018  ? Preventative health care 03/20/2018  ? Mood disorder ([place]) 03/20/2018  ? Advance directive discussed with patient 03/20/2018  ? Lymphedema 11/26/2017  ? Nausea 05/08/2017  ? Narcotic dependence ([place]) 03/09/2017  ? Vertigo 06/28/2016  ? Bronchiectasis without acute exacerbation ([place]) 06/15/2015  ? DDD (degenerative disc disease), lumbosacral 03/30/2015  ? Sleep apnea   ? Atherosclerotic heart disease of native coronary artery with angina pectoris ([place]) 08/25/2014  ? Paroxysmal A-fib ([place]) 08/25/2014  ? 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  ? RLS (restless legs syndrome) 07/06/2014  ? Gout 05/26/2014  ? Chronic pain syndrome 05/26/2014  ? Migraine 04/08/2014  ? Back pain 07/22/2013  ? Metatarsalgia of right foot 07/22/2013  ? Insomnia 07/15/2013  ? OA (osteoarthritis) 11/20/2012  ? DJD (degenerative joint disease) of cervical spine 09/10/2012  ? Allergic rhinitis 07/10/2011  ? Rheumatoid arthritis ([place]) 03/06/2011  ? Morbid obesity with  BMI of 50.0-59.9, adult ([place]) 06/22/2010  ? HLD (hyperlipidemia) 06/22/2010  ? HTN (hypertension) 06/22/2010  ? CAD (coronary artery disease) 06/22/2010  ? Long term current use of anticoagulant therapy 05/27/2010  ? ? ?Conditions to be addressed/monitored:Atrial Fibrillation, CHF, and Lymphedema ? ?Care Plan : RNCM Plan of care  ?Updates made by Dannielle Karvonen, RN since 06/09/2021 12:00 AM  ?  ? ?Problem: Chronic Disesease management, education and care coordination needs ( HF, A-fib, Lymphedema)   ?Priority: High  ?  ? ?Long-Range Goal: Development of plan of care to address chronic disease management and care coordination needs. (HF, A-fib, Lymphedema)   ?Start Date: 01/10/2021  ?Expected End Date: 07/27/2021  ?Priority: High  ?Note:   ?Current Barriers:  ?Knowledge Deficits related to plan of care for management of Atrial Fibrillation, CHF, and Lymphedema  ?Chronic Disease Management support and education needs related to Atrial Fibrillation, CHF, and Lymphedema ? Patient states home health services for PT/ OT/ Nursing started 1 week ago.  Patient report her legs are swollen and oozing  with blisters.   She states she was seen by the home health nurse who contacted her doctor to report her leg symptoms.  Patient states the home health nurse wrapped her legs.  Patient states she is waiting to hear back from home health nurse or primary care office regarding any further instructions.  RNCM called primary care office and spoke with [place] to  schedule patient for follow up visit for 06/10/21 at 9:15 am.  Returned called to patient to notify her of this appointment date and time with provider.   Patient voiced appreciation for the assistance.  Patient states she is still eating some salt. She reports her weight has ranged from 300 to 305 lbs this week.  Patients next follow up appointment with cardiovascular provider is 06/28/21.  ?RNCM Clinical Goal(s):  ?Patient will verbalize basic understanding of Atrial  Fibrillation, CHF, and Lymphedema  disease process and self health management plan as evidenced by the following  through collaboration with RN Care manager, provider, and care team:   ?-verbalize understanding of plan fo

## 2021-06-09 NOTE — Telephone Encounter (Signed)
Anissa with [facility] called stating that she put on Unna Boots for pt because pt legs was weeping with open areas. Anissa would like a call back if you don't want pt to have the boots.Please advise. ?

## 2021-06-10 ENCOUNTER — Encounter: Payer: Self-pay | Admitting: Internal Medicine

## 2021-06-10 ENCOUNTER — Ambulatory Visit (INDEPENDENT_AMBULATORY_CARE_PROVIDER_SITE_OTHER): Payer: Medicare Other | Admitting: Internal Medicine

## 2021-06-10 DIAGNOSIS — G894 Chronic pain syndrome: Secondary | ICD-10-CM

## 2021-06-10 DIAGNOSIS — I2510 Atherosclerotic heart disease of native coronary artery without angina pectoris: Secondary | ICD-10-CM | POA: Diagnosis not present

## 2021-06-10 DIAGNOSIS — L03115 Cellulitis of right lower limb: Secondary | ICD-10-CM | POA: Diagnosis not present

## 2021-06-10 DIAGNOSIS — L97929 Non-pressure chronic ulcer of unspecified part of left lower leg with unspecified severity: Secondary | ICD-10-CM | POA: Insufficient documentation

## 2021-06-10 DIAGNOSIS — N179 Acute kidney failure, unspecified: Secondary | ICD-10-CM | POA: Diagnosis not present

## 2021-06-10 DIAGNOSIS — N1831 Chronic kidney disease, stage 3a: Secondary | ICD-10-CM | POA: Diagnosis not present

## 2021-06-10 DIAGNOSIS — G9341 Metabolic encephalopathy: Secondary | ICD-10-CM | POA: Diagnosis not present

## 2021-06-10 DIAGNOSIS — I130 Hypertensive heart and chronic kidney disease with heart failure and stage 1 through stage 4 chronic kidney disease, or unspecified chronic kidney disease: Secondary | ICD-10-CM | POA: Diagnosis not present

## 2021-06-10 DIAGNOSIS — L97919 Non-pressure chronic ulcer of unspecified part of right lower leg with unspecified severity: Secondary | ICD-10-CM

## 2021-06-10 DIAGNOSIS — L89892 Pressure ulcer of other site, stage 2: Secondary | ICD-10-CM | POA: Diagnosis not present

## 2021-06-10 DIAGNOSIS — I5032 Chronic diastolic (congestive) heart failure: Secondary | ICD-10-CM | POA: Diagnosis not present

## 2021-06-10 MED ORDER — AMOXICILLIN-POT CLAVULANATE 875-125 MG PO TABS: 1.0000 | ORAL_TABLET | Freq: Two times a day (BID) | ORAL | 0 refills | Status: DC

## 2021-06-10 MED ORDER — OXYCODONE HCL 20 MG PO TABS: 1.0000 | ORAL_TABLET | Freq: Two times a day (BID) | ORAL | 0 refills | Status: DC

## 2021-06-10 NOTE — Assessment & Plan Note (Signed)
Hard to tell if stasis changes or infection ?Will give augmentin 875 bid for 7 days --just in case ?

## 2021-06-10 NOTE — Assessment & Plan Note (Signed)
Small and related to the lymphedema and trauma ?Dressed here again with gauze/cling ?

## 2021-06-10 NOTE — Progress Notes (Signed)
? ?Subjective:  ? ? Patient ID: Tina Pacheco, female    DOB: [birth year], 71 y.o.   MRN: [id number] ? ?HPI ?Here with husband due to concerns about possible infection in legs ? ?Ongoing chronic edema ?Torsemide '40mg'$ /spironolactone 25 daily ?Takes prn metolazone if weight up ? ?Has been worse for several weeks ?Pain in legs and top of calves were rubbing when she walked ?Weeping now and some blisters---skin abrades easily ?Some bleeding ?Just wrapped yesterday by nurse ? ?Not much pain ?But hard to lift due to swelling ? ?Current Outpatient Medications on File Prior to Visit  ?Medication Sig Dispense Refill  ? amiodarone (PACERONE) 200 MG tablet Take 1 tablet (200 mg total) by mouth at bedtime. GIVE ONE TABLET BY AT BEDTIME 30 tablet 0  ? apixaban (ELIQUIS) 5 MG TABS tablet TAKE 1 TABLET(5 MG) BY MOUTH TWICE DAILY 60 tablet 0  ? atorvastatin (LIPITOR) 40 MG tablet TAKE 1 TABLET(40 MG) BY MOUTH DAILY 90 tablet 3  ? bisacodyl (DULCOLAX) 10 MG suppository Place 10 mg rectally as needed for moderate constipation.    ? ketoconazole (NIZORAL) 2 % cream APPLY A SMALL AMOUNT TO AFFECTED AREA ONCE A DAY TO RASH ON BUTTOCKS, GROIN 60 g 1  ? leflunomide (ARAVA) 20 MG tablet Take 1 tablet (20 mg total) by mouth daily. 30 tablet 0  ? meclizine (ANTIVERT) 25 MG tablet TAKE 1 TABLET(25 MG) BY MOUTH THREE TIMES DAILY AS NEEDED FOR DIZZINESS 90 tablet 5  ? metolazone (ZAROXOLYN) 2.5 MG tablet TAKE 1 TABLET BY MOUTH DAILY AS NEEDED MAY GIVE 1 TABLET AS NEEDED FOR WEIGHT GAIN OF 5 LBS IN 1 WEEK 30 tablet 11  ? metoprolol succinate (TOPROL-XL) 50 MG 24 hr tablet TAKE 1 TABLET BY MOUTH EVERY DAY WITH OR IMMEDIATELY FOLLOWING A MEAL 90 tablet 3  ? montelukast (SINGULAIR) 10 MG tablet TAKE 1 TABLET BY MOUTH AT BEDTIME 90 tablet 3  ? nitroGLYCERIN (NITROSTAT) 0.4 MG SL tablet Place 1 tablet (0.4 mg total) under the tongue every 5 (five) minutes as needed for chest pain. 20 tablet 12  ? NON FORMULARY Diet:Heart healthy    ? nystatin  (MYCOSTATIN/NYSTOP) powder Apply 1 application topically 3 (three) times daily as needed. 60 g 0  ? omeprazole (PRILOSEC) 40 MG capsule Take 1 capsule (40 mg total) by mouth daily. 30 capsule 2  ? Oxycodone HCl 20 MG TABS Take 1 tablet (20 mg total) by mouth in the morning and at bedtime. 60 tablet 0  ? polyethylene glycol (MIRALAX / GLYCOLAX) packet Take 17 g by mouth daily as needed for mild constipation. 14 each 0  ? Potassium Chloride ER 20 MEQ TBCR Take 20 mEq by mouth daily at 12 noon. (Patient taking differently: Take 40 mEq by mouth 2 (two) times daily.) 30 tablet 0  ? predniSONE (DELTASONE) 5 MG tablet TAKE 3-4 TABLETS BY MOUTH DAILY WITH BREAKFAST. WEAN AS DIRECTED 120 tablet 2  ? SF 5000 PLUS 1.1 % CREA dental cream Place 1 application onto teeth 2 (two) times daily.  1  ? Sodium Phosphates (RA SALINE ENEMA RE) Place rectally as needed.    ? spironolactone (ALDACTONE) 25 MG tablet TAKE 1 TABLET(25 MG) BY MOUTH DAILY 30 tablet 11  ? terazosin (HYTRIN) 10 MG capsule TAKE 1 CAPSULE(10 MG) BY MOUTH AT BEDTIME 90 capsule 3  ? torsemide (DEMADEX) 20 MG tablet TAKE 2 TABLETS BY MOUTH DAILY FOR CONGESTIVE HEART FAILURE 60 tablet 11  ? ?No current facility-administered medications  on file prior to visit.  ? ? ?Allergies  ?Allergen Reactions  ? Fluoxetine Other (See Comments)  ?  Headache, shaking, sleep issues ?Headache, shaking, sleep issues  ? Codeine   ?  Nausea and vomiting/only when taking too much  ? ? ?Past Medical History:  ?Diagnosis Date  ? Arthritis   ? RA  ? Asthma   ? Basal cell carcinoma   ? Cataract 2018  ? bilateral eyes; corrected with surgery  ? Claustrophobia   ? Collagen vascular disease ([place])   ? RA  ? COPD (chronic obstructive pulmonary disease) ([place])   ? Diastolic dysfunction   ? a. echo 07/2014: EF 55-60%, no RWMA, GR2DD, mild MR, LA moderately dilated, PASP 38 mm Hg  ? Dyslipidemia   ? Headache   ? migraines  ? Hemihypertrophy   ? History of cardiac cath   ? a. cardiac cath 05/24/2010 -  nonobstructive CAD  ? History of gout   ? Hyperplastic colonic polyp 2003  ? Hypertension   ? Hypokalemia   ? Morbid obesity ([place])   ? PAF (paroxysmal atrial fibrillation) ([place])   ? a. on Pradaxa; b. CHADSVASc at least 2 (HTN & female)  ? Rheumatoid arthritis(714.0)   ? Sleep apnea   ? a. not compliant with CPAP  ? ? ?Past Surgical History:  ?Procedure Laterality Date  ? ABDOMINAL HYSTERECTOMY    ? APPLICATION OF WOUND VAC Right 08/09/2017  ? Procedure: APPLICATION OF WOUND VAC;  Surgeon: Albertine Patricia, DPM;  Location: [facility];  Service: Podiatry;  Laterality: Right;  ? CARDIAC CATHETERIZATION  05/24/2010  ? nonobstructive CAD  ? CATARACT EXTRACTION W/ INTRAOCULAR LENS IMPLANT Right 06/12/2016  ? Dr. Darleen Crocker  ? CATARACT EXTRACTION W/ INTRAOCULAR LENS IMPLANT Left 06/26/2016  ? Dr. Darleen Crocker  ? CESAREAN SECTION    ? CHOLECYSTECTOMY    ? COLONOSCOPY  06/12/2011  ? Procedure: COLONOSCOPY;  Surgeon: Juanita Craver, MD;  Location: [facility];  Service: Endoscopy;  Laterality: N/A;  ? COLONOSCOPY N/A 03/17/2013  ? Procedure: COLONOSCOPY;  Surgeon: Juanita Craver, MD;  Location: [facility];  Service: Endoscopy;  Laterality: N/A;  ? EYE SURGERY    ? FOOT ARTHRODESIS Right 07/13/2017  ? Procedure: ARTHRODESIS FOOT-MULTI.FUSIONS (6 JOINTS);  Surgeon: Albertine Patricia, DPM;  Location: [facility];  Service: Podiatry;  Laterality: Right;  ? IRRIGATION AND DEBRIDEMENT FOOT Right 08/09/2017  ? Procedure: IRRIGATION AND DEBRIDEMENT FOOT;  Surgeon: Albertine Patricia, DPM;  Location: [facility];  Service: Podiatry;  Laterality: Right;  ? KNEE ARTHROSCOPY    ? bilateral  ? LEFT HEART CATH AND CORONARY ANGIOGRAPHY N/A 06/04/2020  ? Procedure: LEFT HEART CATH AND CORONARY ANGIOGRAPHY;  Surgeon: Wellington Hampshire, MD;  Location: [facility];  Service: Cardiovascular;  Laterality: N/A;  ? REVERSE SHOULDER ARTHROPLASTY Right 11/16/2020  ? Procedure: REVERSE SHOULDER ARTHROPLASTY;  Surgeon: Corky Mull, MD;  Location: [facility];   Service: Orthopedics;  Laterality: Right;  ? SHOULDER CLOSED REDUCTION Right 11/11/2020  ? Procedure: CLOSED REDUCTION SHOULDER;  Surgeon: Corky Mull, MD;  Location: [facility];  Service: Orthopedics;  Laterality: Right;  ? SHOULDER INJECTION Right 11/11/2020  ? Procedure: SHOULDER INJECTION;  Surgeon: Corky Mull, MD;  Location: [facility];  Service: Orthopedics;  Laterality: Right;  ? SINUSOTOMY    ? TOOTH EXTRACTION  12/2016  ? VAGINAL HYSTERECTOMY    ? ? ?Family History  ?Problem Relation Age of Onset  ? Tuberculosis Mother   ? Parkinsonism  Mother   ? Emphysema Father   ?     smoked  ? Arthritis Father   ? Cancer Sister   ? Diabetes type II Sister   ? Breast cancer Sister   ? Tuberculosis Sister   ? Breast cancer Maternal Aunt   ? Colon cancer Neg Hx   ? Esophageal cancer Neg Hx   ? Pancreatic cancer Neg Hx   ? Stomach cancer Neg Hx   ? ? ?Social History  ? ?Socioeconomic History  ? Marital status: Married  ?  Spouse name: Not on file  ? Number of children: 1  ? Years of education: Not on file  ? Highest education level: Not on file  ?Occupational History  ? Occupation: Taxpayer service  ?  Employer: [employer]  ?  Comment: Retired 2007  ?Tobacco Use  ? Smoking status: Never  ?  Passive exposure: Current  ? Smokeless tobacco: Never  ?Vaping Use  ? Vaping Use: Never used  ?Substance and Sexual Activity  ? Alcohol use: No  ? Drug use: No  ? Sexual activity: Not Currently  ?Other Topics Concern  ? Not on file  ?Social History Narrative  ? No living will  ? Husband, then daughter should be decision maker  ? Would accept resuscitation attempts  ? Not sure about tube feeds  ? ?Social Determinants of Health  ? ?Financial Resource Strain: Not on file  ?Food Insecurity: No Food Insecurity  ? Worried About Charity fundraiser in the Last Year: Never true  ? Ran Out of Food in the Last Year: Never true  ?Transportation Needs: No Transportation Needs  ? Lack of Transportation (Medical): No  ? Lack of Transportation (Non-Medical):  No  ?Physical Activity: Not on file  ?Stress: Not on file  ?Social Connections: Not on file  ?Intimate Partner Violence: Not on file  ? ?Review of Systems ?No fever ?No N/V ?Some SOB if she is lying down ? ?   ?Obj

## 2021-06-13 DIAGNOSIS — M75121 Complete rotator cuff tear or rupture of right shoulder, not specified as traumatic: Secondary | ICD-10-CM | POA: Diagnosis not present

## 2021-06-13 DIAGNOSIS — M24411 Recurrent dislocation, right shoulder: Secondary | ICD-10-CM | POA: Diagnosis not present

## 2021-06-13 DIAGNOSIS — Z96611 Presence of right artificial shoulder joint: Secondary | ICD-10-CM | POA: Diagnosis not present

## 2021-06-14 ENCOUNTER — Other Ambulatory Visit: Payer: Self-pay

## 2021-06-14 ENCOUNTER — Telehealth: Payer: Self-pay | Admitting: Internal Medicine

## 2021-06-14 DIAGNOSIS — I130 Hypertensive heart and chronic kidney disease with heart failure and stage 1 through stage 4 chronic kidney disease, or unspecified chronic kidney disease: Secondary | ICD-10-CM | POA: Diagnosis not present

## 2021-06-14 DIAGNOSIS — N179 Acute kidney failure, unspecified: Secondary | ICD-10-CM | POA: Diagnosis not present

## 2021-06-14 DIAGNOSIS — L89892 Pressure ulcer of other site, stage 2: Secondary | ICD-10-CM | POA: Diagnosis not present

## 2021-06-14 DIAGNOSIS — N1831 Chronic kidney disease, stage 3a: Secondary | ICD-10-CM | POA: Diagnosis not present

## 2021-06-14 DIAGNOSIS — G9341 Metabolic encephalopathy: Secondary | ICD-10-CM | POA: Diagnosis not present

## 2021-06-14 DIAGNOSIS — I5032 Chronic diastolic (congestive) heart failure: Secondary | ICD-10-CM | POA: Diagnosis not present

## 2021-06-14 MED ORDER — SILVER SULFADIAZINE 1 % EX CREA: 1.0000 "application " | TOPICAL_CREAM | Freq: Every day | CUTANEOUS | 0 refills | Status: DC

## 2021-06-14 NOTE — Telephone Encounter (Signed)
Sent in rx to pharmacy.

## 2021-06-14 NOTE — Progress Notes (Signed)
Sent rx for silvadene cream per dr Silvio Pate ?

## 2021-06-14 NOTE — Telephone Encounter (Signed)
Called and gave verbal orders to [facility] regarding treatment for patient ?

## 2021-06-14 NOTE — Telephone Encounter (Signed)
Tina Pacheco with [facility] called stating they need to know what the treatment orders for pt legs. Please advise. ?

## 2021-06-14 NOTE — Telephone Encounter (Signed)
Please advise 

## 2021-06-15 ENCOUNTER — Telehealth: Payer: Self-pay | Admitting: Cardiovascular Disease

## 2021-06-15 DIAGNOSIS — I5032 Chronic diastolic (congestive) heart failure: Secondary | ICD-10-CM | POA: Diagnosis not present

## 2021-06-15 DIAGNOSIS — N1831 Chronic kidney disease, stage 3a: Secondary | ICD-10-CM | POA: Diagnosis not present

## 2021-06-15 DIAGNOSIS — I130 Hypertensive heart and chronic kidney disease with heart failure and stage 1 through stage 4 chronic kidney disease, or unspecified chronic kidney disease: Secondary | ICD-10-CM | POA: Diagnosis not present

## 2021-06-15 DIAGNOSIS — N179 Acute kidney failure, unspecified: Secondary | ICD-10-CM | POA: Diagnosis not present

## 2021-06-15 DIAGNOSIS — G9341 Metabolic encephalopathy: Secondary | ICD-10-CM | POA: Diagnosis not present

## 2021-06-15 DIAGNOSIS — L89892 Pressure ulcer of other site, stage 2: Secondary | ICD-10-CM | POA: Diagnosis not present

## 2021-06-15 NOTE — Telephone Encounter (Signed)
Reviewing chart to follow up on testing for this patient. Referral was placed to [facility] last year on 06/29/20 and then again 05/23/21. Patient had still not been scheduled for appointment. Called over to their office to inquire about referral and they will call and set that up with her. Reviewed with my Engineer, building services and was advised to send to [facility].  ?

## 2021-06-15 NOTE — Telephone Encounter (Signed)
Patient did have appointment with [facility] last year on 07/05/20 but was referred again back in March of this year 2023.  ?

## 2021-06-16 DIAGNOSIS — M1A00X0 Idiopathic chronic gout, unspecified site, without tophus (tophi): Secondary | ICD-10-CM | POA: Diagnosis not present

## 2021-06-16 DIAGNOSIS — Z79899 Other long term (current) drug therapy: Secondary | ICD-10-CM | POA: Diagnosis not present

## 2021-06-16 DIAGNOSIS — M159 Polyosteoarthritis, unspecified: Secondary | ICD-10-CM | POA: Diagnosis not present

## 2021-06-16 DIAGNOSIS — M0579 Rheumatoid arthritis with rheumatoid factor of multiple sites without organ or systems involvement: Secondary | ICD-10-CM | POA: Diagnosis not present

## 2021-06-16 DIAGNOSIS — R21 Rash and other nonspecific skin eruption: Secondary | ICD-10-CM | POA: Diagnosis not present

## 2021-06-17 DIAGNOSIS — I5032 Chronic diastolic (congestive) heart failure: Secondary | ICD-10-CM | POA: Diagnosis not present

## 2021-06-17 DIAGNOSIS — L89892 Pressure ulcer of other site, stage 2: Secondary | ICD-10-CM | POA: Diagnosis not present

## 2021-06-17 DIAGNOSIS — N179 Acute kidney failure, unspecified: Secondary | ICD-10-CM | POA: Diagnosis not present

## 2021-06-17 DIAGNOSIS — G9341 Metabolic encephalopathy: Secondary | ICD-10-CM | POA: Diagnosis not present

## 2021-06-17 DIAGNOSIS — I130 Hypertensive heart and chronic kidney disease with heart failure and stage 1 through stage 4 chronic kidney disease, or unspecified chronic kidney disease: Secondary | ICD-10-CM | POA: Diagnosis not present

## 2021-06-17 DIAGNOSIS — N1831 Chronic kidney disease, stage 3a: Secondary | ICD-10-CM | POA: Diagnosis not present

## 2021-06-20 DIAGNOSIS — G9341 Metabolic encephalopathy: Secondary | ICD-10-CM | POA: Diagnosis not present

## 2021-06-20 DIAGNOSIS — N1831 Chronic kidney disease, stage 3a: Secondary | ICD-10-CM | POA: Diagnosis not present

## 2021-06-20 DIAGNOSIS — N179 Acute kidney failure, unspecified: Secondary | ICD-10-CM | POA: Diagnosis not present

## 2021-06-20 DIAGNOSIS — I130 Hypertensive heart and chronic kidney disease with heart failure and stage 1 through stage 4 chronic kidney disease, or unspecified chronic kidney disease: Secondary | ICD-10-CM | POA: Diagnosis not present

## 2021-06-20 DIAGNOSIS — L89892 Pressure ulcer of other site, stage 2: Secondary | ICD-10-CM | POA: Diagnosis not present

## 2021-06-20 DIAGNOSIS — I5032 Chronic diastolic (congestive) heart failure: Secondary | ICD-10-CM | POA: Diagnosis not present

## 2021-06-21 ENCOUNTER — Encounter: Payer: Self-pay | Admitting: Internal Medicine

## 2021-06-21 ENCOUNTER — Ambulatory Visit (INDEPENDENT_AMBULATORY_CARE_PROVIDER_SITE_OTHER): Payer: Medicare Other | Admitting: Internal Medicine

## 2021-06-21 DIAGNOSIS — G894 Chronic pain syndrome: Secondary | ICD-10-CM | POA: Diagnosis not present

## 2021-06-21 DIAGNOSIS — I2510 Atherosclerotic heart disease of native coronary artery without angina pectoris: Secondary | ICD-10-CM

## 2021-06-21 DIAGNOSIS — F1120 Opioid dependence, uncomplicated: Secondary | ICD-10-CM

## 2021-06-21 DIAGNOSIS — I5032 Chronic diastolic (congestive) heart failure: Secondary | ICD-10-CM

## 2021-06-21 MED ORDER — TORSEMIDE 20 MG PO TABS: 40.0000 mg | ORAL_TABLET | Freq: Two times a day (BID) | ORAL | 3 refills | Status: DC

## 2021-06-21 NOTE — Assessment & Plan Note (Signed)
PDMP reviewed No concerns 

## 2021-06-21 NOTE — Progress Notes (Signed)
? ?Subjective:  ? ? Patient ID: Tina Pacheco, female    DOB: [birth year], 71 y.o.   MRN: [id number] ? ?HPI ?Here with husband for follow up of chronic pain and narcotic dependence ? ?Her legs seem worse ?Is going to dermatologist tomorrow ?Silvadene didn't help ?Hasn't been able to tolerate dressings---they pull off any granulation ? ?Chronic back and shoulder pain persists ?Still on the oxycodone---cuts in half and takes '10mg'$  2-3 times a day  ? ?Current Outpatient Medications on File Prior to Visit  ?Medication Sig Dispense Refill  ? amiodarone (PACERONE) 200 MG tablet Take 1 tablet (200 mg total) by mouth at bedtime. GIVE ONE TABLET BY AT BEDTIME 30 tablet 0  ? apixaban (ELIQUIS) 5 MG TABS tablet TAKE 1 TABLET(5 MG) BY MOUTH TWICE DAILY 60 tablet 0  ? atorvastatin (LIPITOR) 40 MG tablet TAKE 1 TABLET(40 MG) BY MOUTH DAILY 90 tablet 3  ? ketoconazole (NIZORAL) 2 % cream APPLY A SMALL AMOUNT TO AFFECTED AREA ONCE A DAY TO RASH ON BUTTOCKS, GROIN 60 g 1  ? leflunomide (ARAVA) 20 MG tablet Take 1 tablet (20 mg total) by mouth daily. 30 tablet 0  ? metolazone (ZAROXOLYN) 2.5 MG tablet TAKE 1 TABLET BY MOUTH DAILY AS NEEDED MAY GIVE 1 TABLET AS NEEDED FOR WEIGHT GAIN OF 5 LBS IN 1 WEEK 30 tablet 11  ? metoprolol succinate (TOPROL-XL) 50 MG 24 hr tablet TAKE 1 TABLET BY MOUTH EVERY DAY WITH OR IMMEDIATELY FOLLOWING A MEAL 90 tablet 3  ? montelukast (SINGULAIR) 10 MG tablet TAKE 1 TABLET BY MOUTH AT BEDTIME 90 tablet 3  ? nitroGLYCERIN (NITROSTAT) 0.4 MG SL tablet Place 1 tablet (0.4 mg total) under the tongue every 5 (five) minutes as needed for chest pain. 20 tablet 12  ? nystatin (MYCOSTATIN/NYSTOP) powder Apply 1 application topically 3 (three) times daily as needed. 60 g 0  ? Oxycodone HCl 20 MG TABS Take 1 tablet (20 mg total) by mouth in the morning and at bedtime. 60 tablet 0  ? polyethylene glycol (MIRALAX / GLYCOLAX) packet Take 17 g by mouth daily as needed for mild constipation. 14 each 0  ? Potassium  Chloride ER 20 MEQ TBCR Take 20 mEq by mouth daily at 12 noon. (Patient taking differently: Take 40 mEq by mouth 2 (two) times daily.) 30 tablet 0  ? silver sulfADIAZINE (SILVADENE) 1 % cream Apply 1 application. topically daily. Apply 1 gram to infected area once a day 50 g 0  ? spironolactone (ALDACTONE) 25 MG tablet TAKE 1 TABLET(25 MG) BY MOUTH DAILY 30 tablet 11  ? terazosin (HYTRIN) 10 MG capsule TAKE 1 CAPSULE(10 MG) BY MOUTH AT BEDTIME 90 capsule 3  ? torsemide (DEMADEX) 20 MG tablet TAKE 2 TABLETS BY MOUTH DAILY FOR CONGESTIVE HEART FAILURE 60 tablet 11  ? ?No current facility-administered medications on file prior to visit.  ? ? ?Allergies  ?Allergen Reactions  ? Fluoxetine Other (See Comments)  ?  Headache, shaking, sleep issues ?Headache, shaking, sleep issues  ? Codeine   ?  Nausea and vomiting/only when taking too much  ? ? ?Past Medical History:  ?Diagnosis Date  ? Arthritis   ? RA  ? Asthma   ? Basal cell carcinoma   ? Cataract 2018  ? bilateral eyes; corrected with surgery  ? Claustrophobia   ? Collagen vascular disease ([place])   ? RA  ? COPD (chronic obstructive pulmonary disease) ([place])   ? Diastolic dysfunction   ? a. echo  07/2014: EF 55-60%, no RWMA, GR2DD, mild MR, LA moderately dilated, PASP 38 mm Hg  ? Dyslipidemia   ? Headache   ? migraines  ? Hemihypertrophy   ? History of cardiac cath   ? 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 History of gout   ? Hyperplastic colonic polyp 2003  ? Hypertension   ? Hypokalemia   ? Morbid obesity ([place])   ? PAF (paroxysmal atrial fibrillation) ([place])   ? a. on Pradaxa; b. CHADSVASc at least 2 (HTN & female)  ? Rheumatoid arthritis(714.0)   ? Sleep apnea   ? a. not compliant with CPAP  ? ? ?Past Surgical History:  ?Procedure Laterality Date  ? ABDOMINAL HYSTERECTOMY    ? APPLICATION OF WOUND VAC Right 08/09/2017  ? Procedure: APPLICATION OF WOUND VAC;  Surgeon: Albertine Patricia, DPM;  Location: [facility];  Service: Podiatry;  Laterality: Right;  ? CARDIAC  CATHETERIZATION  05/24/2010  ? nonobstructive CAD  ? CATARACT EXTRACTION W/ INTRAOCULAR LENS IMPLANT Right 06/12/2016  ? Dr. Darleen Crocker  ? CATARACT EXTRACTION W/ INTRAOCULAR LENS IMPLANT Left 06/26/2016  ? Dr. Darleen Crocker  ? CESAREAN SECTION    ? CHOLECYSTECTOMY    ? COLONOSCOPY  06/12/2011  ? Procedure: COLONOSCOPY;  Surgeon: Juanita Craver, MD;  [facility];  Service: Endoscopy;  Laterality: N/A;  ? COLONOSCOPY N/A 03/17/2013  ? Procedure: COLONOSCOPY;  Surgeon: Juanita Craver, MD;  [facility]: [facility];  Service: Endoscopy;  Laterality: N/A;  ? EYE SURGERY    ? FOOT ARTHRODESIS Right 07/13/2017  ? Procedure: ARTHRODESIS FOOT-MULTI.FUSIONS (6 JOINTS);  Surgeon: Albertine Patricia, DPM;  Location: [facility];  Service: Podiatry;  Laterality: Right;  ? IRRIGATION AND DEBRIDEMENT FOOT Right 08/09/2017  ? Procedure: IRRIGATION AND DEBRIDEMENT FOOT;  Surgeon: Albertine Patricia, DPM;  Location: [facility];  Service: Podiatry;  Laterality: Right;  ? KNEE ARTHROSCOPY    ? bilateral  ? LEFT HEART CATH AND CORONARY ANGIOGRAPHY N/A 06/04/2020  ? Procedure: LEFT HEART CATH AND CORONARY ANGIOGRAPHY;  Surgeon: Wellington Hampshire, MD;  Location: [facility];  Service: Cardiovascular;  Laterality: N/A;  ? REVERSE SHOULDER ARTHROPLASTY Right 11/16/2020  ? Procedure: REVERSE SHOULDER ARTHROPLASTY;  Surgeon: Corky Mull, MD;  Location: [facility];  Service: Orthopedics;  Laterality: Right;  ? SHOULDER CLOSED REDUCTION Right 11/11/2020  ? Procedure: CLOSED REDUCTION SHOULDER;  Surgeon: Corky Mull, MD;  Location: [facility];  Service: Orthopedics;  Laterality: Right;  ? SHOULDER INJECTION Right 11/11/2020  ? Procedure: SHOULDER INJECTION;  Surgeon: Corky Mull, MD;  Location: [facility];  Service: Orthopedics;  Laterality: Right;  ? SINUSOTOMY    ? TOOTH EXTRACTION  12/2016  ? VAGINAL HYSTERECTOMY    ? ? ?Family History  ?Problem Relation Age of Onset  ? Tuberculosis Mother   ? Parkinsonism Mother   ? Emphysema Father   ?      smoked  ? Arthritis Father   ? Cancer Sister   ? Diabetes type II Sister   ? Breast cancer Sister   ? Tuberculosis Sister   ? Breast cancer Maternal Aunt   ? Colon cancer Neg Hx   ? Esophageal cancer Neg Hx   ? Pancreatic cancer Neg Hx   ? Stomach cancer Neg Hx   ? ? ?Social History  ? ?Socioeconomic History  ? Marital status: Married  ?  Spouse name: Not on file  ? Number of children: 1  ? Years of education: Not on  file  ? Highest education level: Not on file  ?Occupational History  ? Occupation: Taxpayer service  ?  Employer: [employer]  ?  Comment: Retired 2007  ?Tobacco Use  ? Smoking status: Never  ?  Passive exposure: Current  ? Smokeless tobacco: Never  ?Vaping Use  ? Vaping Use: Never used  ?Substance and Sexual Activity  ? Alcohol use: No  ? Drug use: No  ? Sexual activity: Not Currently  ?Other Topics Concern  ? Not on file  ?Social History Narrative  ? No living will  ? Husband, then daughter should be decision maker  ? Would accept resuscitation attempts  ? Not sure about tube feeds  ? ?Social Determinants of Health  ? ?Financial Resource Strain: Not on file  ?Food Insecurity: No Food Insecurity  ? Worried About Charity fundraiser in the Last Year: Never true  ? Ran Out of Food in the Last Year: Never true  ?Transportation Needs: No Transportation Needs  ? Lack of Transportation (Medical): No  ? Lack of Transportation (Non-Medical): No  ?Physical Activity: Not on file  ?Stress: Not on file  ?Social Connections: Not on file  ?Intimate Partner Violence: Not on file  ? ?Review of Systems ?Weight is up 4# today---will take the metolazone ?Still taking torsemide 40 daily ?Also on spironolactone ?Sleeps in recliner---if she slumps down, she will get heavy sensation ?   ?Objective:  ? Physical Exam ?Constitutional:   ?   Appearance: Normal appearance.  ?Musculoskeletal:  ?   Comments: 3+ edema in calves ?Small open areas on right, slight crusted spots bilaterally ?Stasis changes (red) but no infection   ?Neurological:  ?   Mental Status: She is alert.  ?  ? ? ? ? ?   ?Assessment & Plan:  ? ?

## 2021-06-21 NOTE — Assessment & Plan Note (Signed)
Continues on the oxycodone--takes '10mg'$  up to 4 times a day ?Tries to limit ?

## 2021-06-21 NOTE — Assessment & Plan Note (Signed)
Gets slight orthopnea ?Edema worse ?Will increase the torsemide to 40 bid ?Continue spironolactone '25mg'$  daily ?Uses the metolazone prn ?

## 2021-06-22 ENCOUNTER — Ambulatory Visit (INDEPENDENT_AMBULATORY_CARE_PROVIDER_SITE_OTHER): Payer: Medicare Other | Admitting: Dermatology

## 2021-06-22 ENCOUNTER — Ambulatory Visit: Payer: Medicare Other | Admitting: Cardiovascular Disease

## 2021-06-22 DIAGNOSIS — I2510 Atherosclerotic heart disease of native coronary artery without angina pectoris: Secondary | ICD-10-CM | POA: Diagnosis not present

## 2021-06-22 DIAGNOSIS — I872 Venous insufficiency (chronic) (peripheral): Secondary | ICD-10-CM | POA: Diagnosis not present

## 2021-06-22 MED ORDER — DOXYCYCLINE MONOHYDRATE 100 MG PO CAPS: 100.0000 mg | ORAL_CAPSULE | Freq: Two times a day (BID) | ORAL | 0 refills | Status: AC

## 2021-06-22 MED ORDER — TRIAMCINOLONE ACETONIDE 0.1 % EX OINT: 1.0000 "application " | TOPICAL_OINTMENT | Freq: Two times a day (BID) | CUTANEOUS | 0 refills | Status: DC | PRN

## 2021-06-22 MED ORDER — TRIAMCINOLONE ACETONIDE 0.1 % EX OINT: TOPICAL_OINTMENT | CUTANEOUS | 0 refills | Status: DC

## 2021-06-22 NOTE — Progress Notes (Signed)
? ?Follow-Up Visit ?  ?Subjective  ?Tina Pacheco is a 71 y.o. female who presents for the following: Rash (Patient here today for rash at legs. Patient saw PCP yesterday and they think it could be cellulitis. Patient was put on augmentin 875 bid for 7 days 06/10/21. About 1 week ago she was given silvadene to apply to affected areas. ). Her PCP advised her to double her torsemide dose. ? ?Patient accompanied by husband, [name], who contributes to history.  ? ?Patient was in hospital in February and she said that legs have become like this in the last 4-6 weeks.  ?Patient has tried Haematologist with no improvement. She is unable to wear compression hose.  She is scheduled to be evaluated for lymphedema pumps.  ? ?The following portions of the chart were reviewed this encounter and updated as appropriate:  ?  ?  ? ?Review of Systems:  No other skin or systemic complaints except as noted in HPI or Assessment and Plan. ? ?Objective  ?Well appearing patient in no apparent distress; mood and affect are within normal limits. ? ?A focused examination was performed including lower legs. Relevant physical exam findings are noted in the Assessment and Plan. ? ?lower legs bilateral ?Prominent tense edema Bl lower legs with erythema and peau d'orange skin textural change, focal vesiculation/oozing and multiple crusted excoriations ? ? ? ?Assessment & Plan  ?Venous stasis dermatitis of both lower extremities ?lower legs bilateral ? ?With superficial infection.  Chronic and persistent condition with duration or expected duration over one year. Condition is bothersome/symptomatic for patient. Currently flared. Discussed rash is caused by extensive and persistent leg swelling. ? ?Stasis in the legs causes chronic leg swelling, which may result in itchy or painful rashes, skin discoloration, skin texture changes, and sometimes ulceration.  Recommend daily graduated compression hose/stockings- easiest to put on first thing in morning,  remove at bedtime.  Elevate legs as much as possible. Avoid salt/sodium rich foods.  ? ?Start doxycycline 100 mg twice daily with food x 14 days #28 0RF ?Start warm compress on legs twice daily, then apply TMC 0.1% ointment to affected areas at legs twice daily. Avoid applying to face, groin, and axilla. Use as directed. Long-term use can cause thinning of the skin. ? ?Recommend mupirocin ointment or an over the counter antibiotic ointment to any open sore and cover with band aid.  ? ?Patient advised to wait until superficial infection (ooze/crust) has resolved before restarting her Cimzia. Increase torsemide (doubling dose) as prescribed by PCP.  Also keep appointment with vascular surgery to get evaluated for lymphedema pumps. Pt states she tried [employer] and they did not work. ? ?Doxycycline should be taken with food to prevent nausea. Do not lay down for 30 minutes after taking. Be cautious with sun exposure and use good sun protection while on this medication. Pregnant women should not take this medication.  ? ?Topical steroids (such as triamcinolone, fluocinolone, fluocinonide, mometasone, clobetasol, halobetasol, betamethasone, hydrocortisone) can cause thinning and lightening of the skin if they are used for too long in the same area. Your physician has selected the right strength medicine for your problem and area affected on the body. Please use your medication only as directed by your physician to prevent side effects.  ? ? ?doxycycline (MONODOX) 100 MG capsule - lower legs bilateral ?Take 1 capsule (100 mg total) by mouth 2 (two) times daily for 14 days. Take with food ? ?triamcinolone ointment (KENALOG) 0.1 % - lower  legs bilateral ?Apply twice daily to affected areas of rash at legs. Avoid applying to face, groin, and axilla. Use as directed. Long-term use can cause thinning of the skin. ? ? ?Return in about 2 weeks (around 07/06/2021) for Rash. ? ?Graciella Belton, RMA, am acting as scribe for Brendolyn Patty, MD . ? ?Documentation: I have reviewed the above documentation for accuracy and completeness, and I agree with the above. ? ?Brendolyn Patty MD  ? ?

## 2021-06-22 NOTE — Patient Instructions (Addendum)
Start doxycycline 100 mg twice daily with food x 14 days #28 0RF ? ?Start TMC 0.1% ointment to affected areas at legs twice daily. Avoid applying to face, groin, and axilla. Use as directed. Long-term use can cause thinning of the skin. ? ?Recommend mupirocin or an over the counter antibiotic ointment to any open sore and cover with band aid.  ? ?Patient advised to wait until infection is resolved before restarting her Cimzia. Increase torsemide as prescribed by PCP. ? ?Doxycycline should be taken with food to prevent nausea. Do not lay down for 30 minutes after taking. Be cautious with sun exposure and use good sun protection while on this medication. Pregnant women should not take this medication.  ? ?Topical steroids (such as triamcinolone, fluocinolone, fluocinonide, mometasone, clobetasol, halobetasol, betamethasone, hydrocortisone) can cause thinning and lightening of the skin if they are used for too long in the same area. Your physician has selected the right strength medicine for your problem and area affected on the body. Please use your medication only as directed by your physician to prevent side effects.  ? ?If You Need Anything After Your Visit ? ?If you have any questions or concerns for your doctor, please call our main line at [phone] and press option 4 to reach your doctor's medical assistant. If no one answers, please leave a voicemail as directed and we will return your call as soon as possible. Messages left after 4 pm will be answered the following business day.  ? ?You may also send [place] a message via MyChart. We typically respond to MyChart messages within 1-2 business days. ? ?For prescription refills, please ask your pharmacy to contact our office. Our fax number is [phone]. ? ?If you have an urgent issue when the clinic is closed that cannot wait until the next business day, you can page your doctor at the number below.   ? ?Please note that while we do our best to be available for  urgent issues outside of office hours, we are not available 24/7.  ? ?If you have an urgent issue and are unable to reach [place], you may choose to seek medical care at your doctor's office, retail clinic, urgent care center, or emergency room. ? ?If you have a medical emergency, please immediately call 911 or go to the emergency department. ? ?Pager Numbers ? ?- Dr. Nehemiah Massed: [phone] ? ?- Dr. Laurence Ferrari: [phone] ? ?- Dr. Nicole Kindred: [phone] ? ?In the event of inclement weather, please call our main line at [phone] for an update on the status of any delays or closures. ? ?Dermatology Medication Tips: ?Please keep the boxes that topical medications come in in order to help keep track of the instructions about where and how to use these. Pharmacies typically print the medication instructions only on the boxes and not directly on the medication tubes.  ? ?If your medication is too expensive, please contact our office at [phone] option 4 or send [place] a message through [place].  ? ?We are unable to tell what your co-pay for medications will be in advance as this is different depending on your insurance coverage. However, we may be able to find a substitute medication at lower cost or fill out paperwork to get insurance to cover a needed medication.  ? ?If a prior authorization is required to get your medication covered by your insurance company, please allow [place] 1-2 business days to complete this process. ? ?Drug prices often vary depending on where the prescription is filled  and some pharmacies may offer cheaper prices. ? ?The website www.goodrx.com contains coupons for medications through different pharmacies. The prices here do not account for what the cost may be with help from insurance (it may be cheaper with your insurance), but the website can give you the price if you did not use any insurance.  ?- You can print the associated coupon and take it with your prescription to the pharmacy.  ?- You may also  stop by our office during regular business hours and pick up a GoodRx coupon card.  ?- If you need your prescription sent electronically to a different pharmacy, notify our office through [facility] or by phone at [phone] option 4. ? ? ? ? ?Si Usted Necesita Algo Despu?s de Su Visita ? ?Tambi?n puede enviarnos un mensaje a trav?s de MyChart. Por lo general respondemos a los mensajes de MyChart en el transcurso de 1 a 2 d?as h?biles. ? ?Para renovar recetas, por favor pida a su farmacia que se ponga en contacto con nuestra oficina. Nuestro n?mero de fax es el [phone]. ? ?Si tiene un asunto urgente cuando la cl?nica est? cerrada y que no puede esperar hasta el siguiente d?a h?bil, puede llamar/localizar a su doctor(a) al n?mero que aparece a continuaci?n.  ? ?Por favor, tenga en cuenta que aunque hacemos todo lo posible para estar disponibles para asuntos urgentes fuera del horario de oficina, no estamos disponibles las 24 horas del d?a, los 7 d?as de la semana.  ? ?Si tiene un problema urgente y no puede comunicarse con nosotros, puede optar por buscar atenci?n m?dica  en el consultorio de su doctor(a), en una cl?nica privada, en un centro de atenci?n urgente o en una sala de emergencias. ? ?Si tiene Engineer, maintenance (IT) m?dica, por favor llame inmediatamente al 911 o vaya a la sala de emergencias. ? ?N?meros de b?per ? ?- Dr. Nehemiah Massed: [phone] ? ?- Dra. Moye: [phone] ? ?- Dra. Nicole Kindred: [phone] ? ?En caso de inclemencias del tiempo, por favor llame a nuestra l?nea principal al [phone] para una actualizaci?n sobre el estado de cualquier retraso o cierre. ? ?Consejos para la medicaci?n en dermatolog?a: ?Por favor, guarde las cajas en las que vienen los medicamentos de uso t?pico para ayudarle a seguir las instrucciones sobre d?nde y c?mo usarlos. Las farmacias generalmente imprimen las instrucciones del medicamento s?lo en las cajas y no directamente en los tubos del [place].  ? ?Si  su medicamento es muy caro, por favor, p?ngase en contacto con Zigmund Daniel llamando al [phone] y presione la opci?n 4 o env?enos un mensaje a trav?s de MyChart.  ? ?No podemos decirle cu?l ser? su copago por los medicamentos por adelantado ya que esto es diferente dependiendo de la cobertura de su seguro. Sin embargo, es posible que podamos encontrar un medicamento sustituto a Electrical engineer un formulario para que el seguro cubra el medicamento que se considera necesario.  ? ?Si se requiere Ardelia Mems autorizaci?n previa para que su compa??a de seguros [place] su medicamento, por favor perm?tanos de 1 a 2 d?as h?biles para completar este proceso. ? ?Los precios de los medicamentos var?an con frecuencia dependiendo del Environmental consultant de d?nde se surte la receta y alguna farmacias pueden ofrecer precios m?s baratos. ? ?El sitio web www.goodrx.com tiene cupones para medicamentos de Airline pilot. Los precios aqu? no tienen en cuenta lo que podr?a costar con la ayuda del seguro (puede ser m?s barato con su seguro), pero el sitio web puede darle el  precio si no utiliz? ning?n seguro.  ?- Puede imprimir el cup?n correspondiente y llevarlo con su receta a la farmacia.  ?- Tambi?n puede pasar por nuestra oficina durante el horario de atenci?n regular y recoger una tarjeta de cupones de GoodRx.  ?- Si necesita que su receta se env?e electr?nicamente a Chiropodist, informe a nuestra oficina a trav?s de MyChart de [place] o por tel?fono llamando al [phone] y presione la opci?n 4. ? ? ?

## 2021-06-24 DIAGNOSIS — I130 Hypertensive heart and chronic kidney disease with heart failure and stage 1 through stage 4 chronic kidney disease, or unspecified chronic kidney disease: Secondary | ICD-10-CM | POA: Diagnosis not present

## 2021-06-24 DIAGNOSIS — N1831 Chronic kidney disease, stage 3a: Secondary | ICD-10-CM | POA: Diagnosis not present

## 2021-06-24 DIAGNOSIS — I5032 Chronic diastolic (congestive) heart failure: Secondary | ICD-10-CM | POA: Diagnosis not present

## 2021-06-24 DIAGNOSIS — G9341 Metabolic encephalopathy: Secondary | ICD-10-CM | POA: Diagnosis not present

## 2021-06-24 DIAGNOSIS — N179 Acute kidney failure, unspecified: Secondary | ICD-10-CM | POA: Diagnosis not present

## 2021-06-24 DIAGNOSIS — L89892 Pressure ulcer of other site, stage 2: Secondary | ICD-10-CM | POA: Diagnosis not present

## 2021-06-26 DIAGNOSIS — J449 Chronic obstructive pulmonary disease, unspecified: Secondary | ICD-10-CM | POA: Diagnosis not present

## 2021-06-26 DIAGNOSIS — Z7901 Long term (current) use of anticoagulants: Secondary | ICD-10-CM | POA: Diagnosis not present

## 2021-06-26 DIAGNOSIS — L89892 Pressure ulcer of other site, stage 2: Secondary | ICD-10-CM | POA: Diagnosis not present

## 2021-06-26 DIAGNOSIS — N179 Acute kidney failure, unspecified: Secondary | ICD-10-CM | POA: Diagnosis not present

## 2021-06-26 DIAGNOSIS — I5032 Chronic diastolic (congestive) heart failure: Secondary | ICD-10-CM | POA: Diagnosis not present

## 2021-06-26 DIAGNOSIS — Z79891 Long term (current) use of opiate analgesic: Secondary | ICD-10-CM | POA: Diagnosis not present

## 2021-06-26 DIAGNOSIS — N1831 Chronic kidney disease, stage 3a: Secondary | ICD-10-CM | POA: Diagnosis not present

## 2021-06-26 DIAGNOSIS — Z9181 History of falling: Secondary | ICD-10-CM | POA: Diagnosis not present

## 2021-06-26 DIAGNOSIS — M069 Rheumatoid arthritis, unspecified: Secondary | ICD-10-CM | POA: Diagnosis not present

## 2021-06-26 DIAGNOSIS — G9341 Metabolic encephalopathy: Secondary | ICD-10-CM | POA: Diagnosis not present

## 2021-06-26 DIAGNOSIS — Z6841 Body Mass Index (BMI) 40.0 and over, adult: Secondary | ICD-10-CM | POA: Diagnosis not present

## 2021-06-26 DIAGNOSIS — G2581 Restless legs syndrome: Secondary | ICD-10-CM | POA: Diagnosis not present

## 2021-06-26 DIAGNOSIS — Z7722 Contact with and (suspected) exposure to environmental tobacco smoke (acute) (chronic): Secondary | ICD-10-CM | POA: Diagnosis not present

## 2021-06-26 DIAGNOSIS — I480 Paroxysmal atrial fibrillation: Secondary | ICD-10-CM

## 2021-06-26 DIAGNOSIS — I130 Hypertensive heart and chronic kidney disease with heart failure and stage 1 through stage 4 chronic kidney disease, or unspecified chronic kidney disease: Secondary | ICD-10-CM | POA: Diagnosis not present

## 2021-06-26 DIAGNOSIS — I2510 Atherosclerotic heart disease of native coronary artery without angina pectoris: Secondary | ICD-10-CM | POA: Diagnosis not present

## 2021-06-27 ENCOUNTER — Ambulatory Visit (INDEPENDENT_AMBULATORY_CARE_PROVIDER_SITE_OTHER): Payer: Medicare Other

## 2021-06-27 DIAGNOSIS — I480 Paroxysmal atrial fibrillation: Secondary | ICD-10-CM

## 2021-06-27 DIAGNOSIS — I890 Lymphedema, not elsewhere classified: Secondary | ICD-10-CM

## 2021-06-27 DIAGNOSIS — I5032 Chronic diastolic (congestive) heart failure: Secondary | ICD-10-CM

## 2021-06-27 NOTE — Patient Instructions (Signed)
Visit Information ? ?Thank you for taking time to visit with me today. Please don't hesitate to contact me if I can be of assistance to you before our next scheduled telephone appointment. ? ?Following are the goals we discussed today:  ?Continue to take medications as prescribed and refill timely ?Attend scheduled provider appointments  ?Continue to weight daily and record. Notify provider for weight gain of 3 lbs overnight and 5 lbs in a week.  ?Check  (heart) rate once a day and record ?Call your doctor for new, ongoing or worsening symptoms.  ?Monitor blood pressure at least 1-2 times per week.  Notify provider for blood pressure concerns.  ?Continue to follow a low salt diet ?Elevate legs when sitting.  ?Continue to follow fall prevention strategies:  Use your assistive device ( cane or walker) if advised by your doctor, Make surer walkways are clear of clutter, cords, and throw rugs,  Make sure there is good lighting throughout your home.  ? ?Our next appointment is by telephone on 07/28/21 at 1:00 pm ? ?Please call the care guide team at [phone] if you need to cancel or reschedule your appointment.  ? ?If you are experiencing a Mental Health or [place] or need someone to talk to, please call the Suicide and Crisis Lifeline: 988 ?call 1-800-273-TALK (toll free, 24 hour hotline)  ? ?Patient verbalizes understanding of instructions and care plan provided today and agrees to view in [place]. Active MyChart status confirmed with patient.   ? ?Quinn Plowman RN,BSN,CCM ?RN Case Manager ?[place]  ?[phone] ? ?

## 2021-06-27 NOTE — Chronic Care Management (AMB) (Signed)
Chronic Care Management   CCM RN Visit Note  06/27/2021 Name: Tina Pacheco MRN: [id number] DOB: 1951-01-31  Subjective: Tina Tina Pacheco is a 71 y.o. year old female who is a primary care patient of Karie Schwalbe, MD. The care management team was consulted for assistance with disease management and care coordination needs.    Engaged with patient by telephone for follow up visit in response to provider referral for case management and/or care coordination services.   Consent to Services:  The patient was given information about Chronic Care Management services, agreed to services, and gave verbal consent prior to initiation of services.  Please see initial visit note for detailed documentation.   Patient agreed to services and verbal consent obtained.   Assessment: Review of patient past medical history, allergies, medications, health status, including review of consultants reports, laboratory and other test data, was performed as part of comprehensive evaluation and provision of chronic care management services.   SDOH (Social Determinants of Health) assessments and interventions performed:    CCM Care Plan  Allergies  Allergen Reactions   Fluoxetine Other (See Comments)    Headache, shaking, sleep issues Headache, shaking, sleep issues   Codeine     Nausea and vomiting/only when taking too much    Outpatient Encounter Medications as of 06/27/2021  Medication Sig Note   amiodarone (PACERONE) 200 MG tablet Take 1 tablet (200 mg total) by mouth at bedtime. GIVE ONE TABLET BY AT BEDTIME    apixaban (ELIQUIS) 5 MG TABS tablet TAKE 1 TABLET(5 MG) BY MOUTH TWICE DAILY    atorvastatin (LIPITOR) 40 MG tablet TAKE 1 TABLET(40 MG) BY MOUTH DAILY    doxycycline (MONODOX) 100 MG capsule Take 1 capsule (100 mg total) by mouth 2 (two) times daily for 14 days. Take with food    ketoconazole (NIZORAL) 2 % cream APPLY A SMALL AMOUNT TO AFFECTED AREA ONCE A DAY TO RASH ON BUTTOCKS, GROIN     leflunomide (ARAVA) 20 MG tablet Take 1 tablet (20 mg total) by mouth daily.    metolazone (ZAROXOLYN) 2.5 MG tablet TAKE 1 TABLET BY MOUTH DAILY AS NEEDED MAY GIVE 1 TABLET AS NEEDED FOR WEIGHT GAIN OF 5 LBS IN 1 WEEK    metoprolol succinate (TOPROL-XL) 50 MG 24 hr tablet TAKE 1 TABLET BY MOUTH EVERY DAY WITH OR IMMEDIATELY FOLLOWING A MEAL    montelukast (SINGULAIR) 10 MG tablet TAKE 1 TABLET BY MOUTH AT BEDTIME    nitroGLYCERIN (NITROSTAT) 0.4 MG SL tablet Place 1 tablet (0.4 mg total) under the tongue every 5 (five) minutes as needed for chest pain.    nystatin (MYCOSTATIN/NYSTOP) powder Apply 1 application topically 3 (three) times daily as needed.    Oxycodone HCl 20 MG TABS Take 1 tablet (20 mg total) by mouth in the morning and at bedtime.    polyethylene glycol (MIRALAX / GLYCOLAX) packet Take 17 g by mouth daily as needed for mild constipation.    Potassium Chloride ER 20 MEQ TBCR Take 20 mEq by mouth daily at 12 noon. (Patient taking differently: Take 40 mEq by mouth 2 (two) times daily.) 06/09/2021: Patient takes one per day except on days she takes metolazone and torsemide she takes two tablets.   silver sulfADIAZINE (SILVADENE) 1 % cream Apply 1 application. topically daily. Apply 1 gram to infected area once a day    spironolactone (ALDACTONE) 25 MG tablet TAKE 1 TABLET(25 MG) BY MOUTH DAILY    terazosin (HYTRIN) 10 MG  capsule TAKE 1 CAPSULE(10 MG) BY MOUTH AT BEDTIME    torsemide (DEMADEX) 20 MG tablet Take 2 tablets (40 mg total) by mouth 2 (two) times daily.    triamcinolone ointment (KENALOG) 0.1 % Apply twice daily to affected areas of rash at legs. Avoid applying to face, groin, and axilla. Use as directed. Long-term use can cause thinning of the skin.    No facility-administered encounter medications on file as of 06/27/2021.    Patient Active Problem List   Diagnosis Date Noted   Cellulitis of right leg 06/10/2021   Ulcers of both lower legs (HCC) 06/10/2021   Acute renal  failure superimposed on stage 3a chronic kidney disease (HCC) 04/21/2021   Dehydration 04/21/2021   Hypomagnesemia 04/21/2021   Leukocytosis 04/21/2021   Recurrent falls 04/21/2021   Pressure injury of skin 04/21/2021   Acute metabolic encephalopathy 04/20/2021   Tremor 03/23/2021   Sleep disturbance 03/23/2021   Closed dislocation of right shoulder 11/10/2020   Stage 3b chronic kidney disease (HCC) 07/07/2020   Demand ischemia (HCC)    Polymyalgia rheumatica (HCC) 02/03/2020   Chronic diastolic CHF (congestive heart failure) (HCC)    COPD (chronic obstructive pulmonary disease) (HCC) 03/30/2019   Urge incontinence of urine 12/23/2018   Left leg pain 09/18/2018   Chronic gouty arthropathy without tophi 04/01/2018   Preventative health care 03/20/2018   Mood disorder (HCC) 03/20/2018   Advance directive discussed with patient 03/20/2018   Lymphedema 11/26/2017   Nausea 05/08/2017   Narcotic dependence (HCC) 03/09/2017   Vertigo 06/28/2016   Bronchiectasis without acute exacerbation (HCC) 06/15/2015   DDD (degenerative disc disease), lumbosacral 03/30/2015   Sleep apnea    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) 08/25/2014   Paroxysmal A-fib (HCC) 08/25/2014   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   RLS (restless legs syndrome) 07/06/2014   Gout 05/26/2014   Chronic pain syndrome 05/26/2014   Migraine 04/08/2014   Back pain 07/22/2013   Metatarsalgia of right foot 07/22/2013   Insomnia 07/15/2013   OA (osteoarthritis) 11/20/2012   DJD (degenerative joint disease) of cervical spine 09/10/2012   Allergic rhinitis 07/10/2011   Rheumatoid arthritis (HCC) 03/06/2011   Morbid obesity with BMI of 50.0-59.9, adult (HCC) 06/22/2010   HLD (hyperlipidemia) 06/22/2010   HTN (hypertension) 06/22/2010   CAD (coronary artery disease) 06/22/2010   Long term current use of anticoagulant therapy 05/27/2010    Conditions to be  addressed/monitored:Atrial Fibrillation, CHF, and Lymphedema  Care Plan : [facility] Plan of care  Updates made by Otho Ket, RN since 06/27/2021 12:00 AM     Problem: Chronic Disesease management, education and care coordination needs ( HF, A-fib, Lymphedema)   Priority: High     Long-Range Goal: Development of plan of care to address chronic disease management and care coordination needs. (HF, A-fib, Lymphedema)   Start Date: 01/10/2021  Expected End Date: 08/26/2021  Priority: High  Note:   Current Barriers:  Knowledge Deficits related to plan of care for management of Atrial Fibrillation, CHF, and Lymphedema  Chronic Disease Management support and education needs related to Atrial Fibrillation, CHF, and Lymphedema  Patient reports having follow up with dermatologist on 06/22/21.  She states she is taking an antibiotic and using prescribed creams to her legs due to dermatitis/ infection.   Patient reports having follow up visit with primary care provider on 06/21/21. She states her torsemide was increased and she has been advised to take her  metolazone as needed. She reports having an increase fluid weight gain of 13-15 lbs over the last 2 weeks. Patient states today's weight is 313.8.  She states she is taking her metolazone daily due to the weight increase.   Patient reports having follow up visit with rheumatologist on 06/16/21.  She states her Cimzia infusion is on hold until she completes her antibiotic treatment for her legs.  Patient states she continues to received home health services.   RNCM Clinical Goal(s):  Patient will verbalize basic understanding of Atrial Fibrillation, CHF, and Lymphedema  disease process and self health management plan as evidenced by the following  through collaboration with RN Care manager, provider, and care team:   -verbalize understanding of plan for management of Atrial Fibrillation, CHF, and Lymphedema -take all medications exactly as prescribed and will  call provider for medication related questions -attend all scheduled medical appointments:  -continue to work with RN Care Manager to address care management and care coordination needs related to  Atrial Fibrillation, CHF, and Lymphedema Interventions: 1:1 collaboration with primary care provider regarding development and update of comprehensive plan of care as evidenced by provider attestation and co-signature Inter-disciplinary care team collaboration (see longitudinal plan of care) Evaluation of current treatment plan related to  self management and patient's adherence to plan as established by provider   Lymphedema Interventions:  Goal on track:  Yes.  Long term Evaluation of current treatment plan related to Lymphedema and patient's adherence to plan as established by provider; Reviewed medications with patient and discussed  Reviewed scheduled/upcoming provider appointments  Discussed plans with patient for ongoing care management follow up and provided patient with direct contact information for care management team; Assessed for falls Patient advised to elevate legs when sitting,   Heart Failure Interventions: Goal on track: yes Long term Provided education on low sodium diet; Discussed importance of daily weight and advised patient to weigh and record daily; Discussed the importance of keeping all appointments with provider; Encouraged patient to decrease salt intake  AFIB Interventions: Goal on track: Yes.  Long Term Reviewed importance of adherence to anticoagulant exactly as prescribed Counseled on avoidance of NSAIDs due to increased bleeding risk with anticoagulants;  Assessed for atrial fibrillation symptoms.  Advised to notify doctor for mild/ moderate symptoms and call 911 for severe symptoms.  Patient Goals/Self-Care Activities: Continue to take medications as prescribed and refill timely Attend scheduled provider appointments  Continue to weight daily and record.  Notify provider for weight gain of 3 lbs overnight and 5 lbs in a week.  Check  (heart) rate once a day and record Call your doctor for new, ongoing or worsening symptoms.  Monitor blood pressure at least 1-2 times per week.  Notify provider for blood pressure concerns.  Continue to follow a low salt diet Elevate legs when sitting.  Continue to follow fall prevention strategies:  Use your assistive device ( cane or walker) if advised by your doctor, Make surer walkways are clear of clutter, cords, and throw rugs,  Make sure there is good lighting throughout your home.       Plan:The patient has been provided with contact information for the care management team and has been advised to call with any health related questions or concerns.  The care management team will reach out to the patient again over the next 45 days. George Ina RN,BSN,CCM RN Case Manager [place] [place]  [phone]

## 2021-06-27 NOTE — Progress Notes (Signed)
?  ? ? ? ?Date:  06/28/2021  ? ?ID:  Tina Pacheco, DOB [birth year], MRN [id number] ? ?Patient Location:  ?[place] ?[place] [address]  ? ?Provider location:   ?[place], [employer] office ? ?PCP:  Tina Carbon, MD  ?Cardiologist:  Tina Rogue, MD  ? ?Chief Complaint  ?Patient presents with  ? Leg Swelling  ?  Patient c/o chest pain, shortness of breath and LE edema. Medications reviewed by the patient verbally.   ? ? ?History of Present Illness:   ? ?Tina Pacheco is a 71 y.o. female  past medical history of: ?morbid obesity ,  fibromyalgia  ?May 23, 2010 paroxysmal atrial fibrillation,  ?echocardiogram showing normal LV function with diastolic dysfunction,  ?hyperlipidemia,  ?hypertension,  ?cardiac catheterization May 24 2010 mild nonobstructive coronary artery disease,   ?asthma/COPD,  ?suspected sleep apnea,  ?atrial fibrillation March and July 2012, October 2013.(no symptoms) ?started on diltiazem infusion and converted to normal sinus rhythm  ?rheumatoid arthritis  ?who presents for routine followup of her atrial fibrillation, chronic diastolic CHF. ? ?Last seen in clinic June 2022 ? ?Recent records reviewed ?Cardiac catheterization performed April 2022 ?Nonobstructive disease noted ?Dist LAD lesion is 30% stenosed. ?Ost LAD to Mid LAD lesion is 40% stenosed. ? No evidence of obstructive coronary artery disease.  Relatively stable mild to moderate proximal LAD disease. ? ?Long discussion concerning weight, diet, high salt Intake, ?Continues to have difficulty with chronic leg swelling ?When sitting has her legs elevated ?Unable to wear compression hose, ace wraps ?Working with vascular to see if she will qualify for lymphedema compression pumps ? ?Recent climb in her weight from 290 pounds was up to 310 or more ?Had worsening leg swelling, family has cut back on her sodas, soup, high salt foods ?Weight slowly trending down, some improvement in her leg edema ,still not at  baseline ? ?Sedentary, sits most of the day ? ?Continues on torsemide 40 twice daily, takes metolazone when weight running high ? ?EKG personally reviewed by myself on todays visit ?NSR rate 61 bpm diffuse T wave abnormality inferior leads, no significant change ? ? ? ?Past Medical History:  ?Diagnosis Date  ? Arthritis   ? RA  ? Asthma   ? Basal cell carcinoma   ? Cataract 2018  ? bilateral eyes; corrected with surgery  ? Claustrophobia   ? Collagen vascular disease ([place])   ? RA  ? COPD (chronic obstructive pulmonary disease) ([place])   ? Diastolic dysfunction   ? a. echo 07/2014: EF 55-60%, no RWMA, GR2DD, mild MR, LA moderately dilated, PASP 38 mm Hg  ? Dyslipidemia   ? Headache   ? migraines  ? Hemihypertrophy   ? History of cardiac cath   ? 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 History of gout   ? Hyperplastic colonic polyp 2003  ? Hypertension   ? Hypokalemia   ? Morbid obesity ([place])   ? PAF (paroxysmal atrial fibrillation) ([place])   ? a. on Pradaxa; b. CHADSVASc at least 2 (HTN & female)  ? Rheumatoid arthritis(714.0)   ? Sleep apnea   ? a. not compliant with CPAP  ? ?Past Surgical History:  ?Procedure Laterality Date  ? ABDOMINAL HYSTERECTOMY    ? APPLICATION OF WOUND VAC Right 08/09/2017  ? Procedure: APPLICATION OF WOUND VAC;  Surgeon: Albertine Patricia, DPM;  Location: [facility];  Service: Podiatry;  Laterality: Right;  ? CARDIAC CATHETERIZATION  05/24/2010  ? nonobstructive  CAD  ? CATARACT EXTRACTION W/ INTRAOCULAR LENS IMPLANT Right 06/12/2016  ? Dr. Darleen Crocker  ? CATARACT EXTRACTION W/ INTRAOCULAR LENS IMPLANT Left 06/26/2016  ? Dr. Darleen Crocker  ? CESAREAN SECTION    ? CHOLECYSTECTOMY    ? COLONOSCOPY  06/12/2011  ? Procedure: COLONOSCOPY;  Surgeon: Juanita Craver, MD;  Location: [facility];  Service: Endoscopy;  Laterality: N/A;  ? COLONOSCOPY N/A 03/17/2013  ? Procedure: COLONOSCOPY;  Surgeon: Juanita Craver, MD;  Location: [facility];  Service: Endoscopy;  Laterality: N/A;  ? EYE SURGERY    ?  FOOT ARTHRODESIS Right 07/13/2017  ? Procedure: ARTHRODESIS FOOT-MULTI.FUSIONS (6 JOINTS);  Surgeon: Albertine Patricia, DPM;  Location: [facility];  Service: Podiatry;  Laterality: Right;  ? IRRIGATION AND DEBRIDEMENT FOOT Right 08/09/2017  ? Procedure: IRRIGATION AND DEBRIDEMENT FOOT;  Surgeon: Albertine Patricia, DPM;  Location: [facility];  Service: Podiatry;  Laterality: Right;  ? KNEE ARTHROSCOPY    ? bilateral  ? LEFT HEART CATH AND CORONARY ANGIOGRAPHY N/A 06/04/2020  ? Procedure: LEFT HEART CATH AND CORONARY ANGIOGRAPHY;  Surgeon: Wellington Hampshire, MD;  [facility];  Service: Cardiovascular;  Laterality: N/A;  ? REVERSE SHOULDER ARTHROPLASTY Right 11/16/2020  ? Procedure: REVERSE SHOULDER ARTHROPLASTY;  Surgeon: Corky Mull, MD;  Location: [facility];  Service: Orthopedics;  Laterality: Right;  ? SHOULDER CLOSED REDUCTION Right 11/11/2020  ? Procedure: CLOSED REDUCTION SHOULDER;  Surgeon: Corky Mull, MD;  Location: [facility];  Service: Orthopedics;  Laterality: Right;  ? SHOULDER INJECTION Right 11/11/2020  ? Procedure: SHOULDER INJECTION;  Surgeon: Corky Mull, MD;  Location: [facility];  Service: Orthopedics;  Laterality: Right;  ? SINUSOTOMY    ? TOOTH EXTRACTION  12/2016  ? VAGINAL HYSTERECTOMY    ?  ? ?Current Outpatient Medications on File Prior to Visit  ?Medication Sig Dispense Refill  ? amiodarone (PACERONE) 200 MG tablet Take 1 tablet (200 mg total) by mouth at bedtime. GIVE ONE TABLET BY AT BEDTIME 30 tablet 0  ? apixaban (ELIQUIS) 5 MG TABS tablet TAKE 1 TABLET(5 MG) BY MOUTH TWICE DAILY 60 tablet 0  ? atorvastatin (LIPITOR) 40 MG tablet TAKE 1 TABLET(40 MG) BY MOUTH DAILY 90 tablet 3  ? doxycycline (MONODOX) 100 MG capsule Take 1 capsule (100 mg total) by mouth 2 (two) times daily for 14 days. Take with food 28 capsule 0  ? ketoconazole (NIZORAL) 2 % cream APPLY A SMALL AMOUNT TO AFFECTED AREA ONCE A DAY TO RASH ON BUTTOCKS, GROIN 60 g 1  ? leflunomide (ARAVA) 20 MG tablet Take 1 tablet  (20 mg total) by mouth daily. 30 tablet 0  ? leflunomide (ARAVA) 20 MG tablet Take 1 tablet by mouth daily.    ? metolazone (ZAROXOLYN) 2.5 MG tablet TAKE 1 TABLET BY MOUTH DAILY AS NEEDED MAY GIVE 1 TABLET AS NEEDED FOR WEIGHT GAIN OF 5 LBS IN 1 WEEK 30 tablet 11  ? metoprolol succinate (TOPROL-XL) 50 MG 24 hr tablet TAKE 1 TABLET BY MOUTH EVERY DAY WITH OR IMMEDIATELY FOLLOWING A MEAL 90 tablet 3  ? montelukast (SINGULAIR) 10 MG tablet TAKE 1 TABLET BY MOUTH AT BEDTIME 90 tablet 3  ? nitroGLYCERIN (NITROSTAT) 0.4 MG SL tablet Place 1 tablet (0.4 mg total) under the tongue every 5 (five) minutes as needed for chest pain. 20 tablet 12  ? nystatin (MYCOSTATIN/NYSTOP) powder Apply 1 application topically 3 (three) times daily as needed. 60 g 0  ? Oxycodone HCl 20 MG TABS  Take 1 tablet (20 mg total) by mouth in the morning and at bedtime. 60 tablet 0  ? polyethylene glycol (MIRALAX / GLYCOLAX) packet Take 17 g by mouth daily as needed for mild constipation. 14 each 0  ? Potassium Chloride ER 20 MEQ TBCR Take 20 mEq by mouth daily at 12 noon. (Patient taking differently: Take 40 mEq by mouth 2 (two) times daily.) 30 tablet 0  ? silver sulfADIAZINE (SILVADENE) 1 % cream Apply 1 application. topically daily. Apply 1 gram to infected area once a day 50 g 0  ? spironolactone (ALDACTONE) 25 MG tablet TAKE 1 TABLET(25 MG) BY MOUTH DAILY 30 tablet 11  ? terazosin (HYTRIN) 10 MG capsule TAKE 1 CAPSULE(10 MG) BY MOUTH AT BEDTIME 90 capsule 3  ? torsemide (DEMADEX) 20 MG tablet Take 2 tablets (40 mg total) by mouth 2 (two) times daily. 360 tablet 3  ? triamcinolone ointment (KENALOG) 0.1 % Apply twice daily to affected areas of rash at legs. Avoid applying to face, groin, and axilla. Use as directed. Long-term use can cause thinning of the skin. 454 g 0  ? ?No current facility-administered medications on file prior to visit.  ? ? ?Allergies:   Fluoxetine and Codeine  ? ?Social History  ? ?Tobacco Use  ? Smoking status: Never  ?   Passive exposure: Current  ? Smokeless tobacco: Never  ?Vaping Use  ? Vaping Use: Never used  ?Substance Use Topics  ? Alcohol use: No  ? Drug use: No  ?  ? ?Family Hx: ?The patient's family history includes

## 2021-06-28 ENCOUNTER — Ambulatory Visit (INDEPENDENT_AMBULATORY_CARE_PROVIDER_SITE_OTHER): Payer: Medicare Other | Admitting: Cardiovascular Disease

## 2021-06-28 VITALS — BP 110/58 | HR 61 | Ht 63.0 in | Wt 309.0 lb

## 2021-06-28 DIAGNOSIS — I5032 Chronic diastolic (congestive) heart failure: Secondary | ICD-10-CM

## 2021-06-28 DIAGNOSIS — I10 Essential (primary) hypertension: Secondary | ICD-10-CM | POA: Diagnosis not present

## 2021-06-28 DIAGNOSIS — I890 Lymphedema, not elsewhere classified: Secondary | ICD-10-CM

## 2021-06-28 DIAGNOSIS — I480 Paroxysmal atrial fibrillation: Secondary | ICD-10-CM | POA: Diagnosis not present

## 2021-06-28 DIAGNOSIS — E782 Mixed hyperlipidemia: Secondary | ICD-10-CM | POA: Diagnosis not present

## 2021-06-28 DIAGNOSIS — N179 Acute kidney failure, unspecified: Secondary | ICD-10-CM | POA: Diagnosis not present

## 2021-06-28 DIAGNOSIS — I2510 Atherosclerotic heart disease of native coronary artery without angina pectoris: Secondary | ICD-10-CM | POA: Diagnosis not present

## 2021-06-28 DIAGNOSIS — Z6841 Body Mass Index (BMI) 40.0 and over, adult: Secondary | ICD-10-CM | POA: Diagnosis not present

## 2021-06-28 DIAGNOSIS — L89892 Pressure ulcer of other site, stage 2: Secondary | ICD-10-CM | POA: Diagnosis not present

## 2021-06-28 DIAGNOSIS — I130 Hypertensive heart and chronic kidney disease with heart failure and stage 1 through stage 4 chronic kidney disease, or unspecified chronic kidney disease: Secondary | ICD-10-CM | POA: Diagnosis not present

## 2021-06-28 DIAGNOSIS — N183 Chronic kidney disease, stage 3 unspecified: Secondary | ICD-10-CM

## 2021-06-28 DIAGNOSIS — D649 Anemia, unspecified: Secondary | ICD-10-CM | POA: Diagnosis not present

## 2021-06-28 DIAGNOSIS — G9341 Metabolic encephalopathy: Secondary | ICD-10-CM | POA: Diagnosis not present

## 2021-06-28 DIAGNOSIS — N1831 Chronic kidney disease, stage 3a: Secondary | ICD-10-CM | POA: Diagnosis not present

## 2021-06-28 NOTE — Patient Instructions (Addendum)
Medication Instructions:  ?No changes ? ?If you need a refill on your cardiac medications before your next appointment, please call your pharmacy.  ? ?Lab work: ?BMP in 1 to 2 weeks ? ?Please go to the [place] to have this drawn.  ? ?Nature conservation officer at [facility] ?1st desk on the right to check in, past the screening table ?Lab hours: Monday- Friday (7:30 am- 5:30 pm)  ? ?Testing/Procedures: ?No new testing needed ? ?Follow-Up: ?At [facility], you and your health needs are our priority.  As part of our continuing mission to provide you with exceptional heart care, we have created designated Provider Care Teams.  These Care Teams include your primary Cardiologist (physician) and Advanced Practice Providers (APPs -  Physician Assistants and Nurse Practitioners) who all work together to provide you with the care you need, when you need it. ? ?You will need a follow up appointment in 6 months, APP ok ? ?Providers on your designated Care Team:   ?Murray Hodgkins, NP ?Christell Faith, PA-C ?Cadence Kathlen Mody, PA-C ? ?COVID-19 Vaccine Information can be found at: [email or web address] For questions related to vaccine distribution or appointments, please email [email or web address] or call [phone].  ? ?

## 2021-06-29 ENCOUNTER — Telehealth: Payer: Self-pay | Admitting: Internal Medicine

## 2021-06-29 NOTE — Telephone Encounter (Signed)
Home Health Verbal Orders ?Caller Name:  Carlynn Spry ?Agency Name: [facility] ? ?Callback number: [phone] ? ?Requesting: PT ?Reason:Increase her time to meet her care plan goals.  ? ?Frequency: Once a week for 7 weeks ? ?Please forward to [facility] pool or providers CMA  ?

## 2021-06-30 NOTE — Telephone Encounter (Signed)
Left verbal orders on VM. 

## 2021-06-30 NOTE — Addendum Note (Signed)
Addended by: Anselm Pancoast on: 06/30/2021 01:18 PM ? ? Modules accepted: Orders ? ?

## 2021-07-01 ENCOUNTER — Telehealth: Payer: Self-pay

## 2021-07-01 DIAGNOSIS — I5032 Chronic diastolic (congestive) heart failure: Secondary | ICD-10-CM | POA: Diagnosis not present

## 2021-07-01 DIAGNOSIS — I130 Hypertensive heart and chronic kidney disease with heart failure and stage 1 through stage 4 chronic kidney disease, or unspecified chronic kidney disease: Secondary | ICD-10-CM | POA: Diagnosis not present

## 2021-07-01 DIAGNOSIS — L89892 Pressure ulcer of other site, stage 2: Secondary | ICD-10-CM | POA: Diagnosis not present

## 2021-07-01 DIAGNOSIS — Z20822 Contact with and (suspected) exposure to covid-19: Secondary | ICD-10-CM | POA: Diagnosis not present

## 2021-07-01 DIAGNOSIS — N1831 Chronic kidney disease, stage 3a: Secondary | ICD-10-CM | POA: Diagnosis not present

## 2021-07-01 DIAGNOSIS — G9341 Metabolic encephalopathy: Secondary | ICD-10-CM | POA: Diagnosis not present

## 2021-07-01 DIAGNOSIS — N179 Acute kidney failure, unspecified: Secondary | ICD-10-CM | POA: Diagnosis not present

## 2021-07-01 NOTE — Telephone Encounter (Signed)
[facility] - Client ?TELEPHONE ADVICE RECORD ?[facility]? ?Patient ?Name: ?Tina WILL ?Pacheco ?Gender: Female ?DOB: Dec 26, 1950 ?Age: 71 Y 44 M 27 D ?Return ?Phone ?Number: ?[phone] ?(Primary), ?[phone] ?(Secondary) ?Address: ?City/ ?State/ ?Zip: [place] ? [address] ?Client [facility] - Client ?Client Site [place] [facility] ?Provider [name] ?Contact Type Call ?Who Is Calling Patient / Member / Family / Caregiver ?Call Type Triage / Clinical ?Relationship To Patient Self ?Return Phone Number [phone] (Secondary) ?Chief Complaint Vomiting ?Reason for Call Symptomatic / Request for Health Information ?Initial Comment Caller states she has been vomiting for a few days. ?Vomitting on and off mucus through out the day. ?Translation No ?Nurse Assessment ?Nurse: Glean Salvo, RN, Magda Paganini Date/Time [name]: 07/01/2021 12:03:57 PM ?Confirm and document reason for call. If ?symptomatic, describe symptoms. ?---Caller states that she is having some intermittent ?vomiting for the past few days. No fever, no diarrhea, ?no abdominal pain. This started in January when she ?was very sick for a long time. ?Does the patient have any new or worsening ?symptoms? ---Yes ?Will a triage be completed? ---Yes ?Related visit to physician within the last 2 weeks? ---No ?Does the PT have any chronic conditions? (i.e. ?diabetes, asthma, this includes High risk factors for ?pregnancy, etc.) ?---Yes ?List chronic conditions. ---rheumatoid arthritis, CHF ?Is this a behavioral health or substance abuse call? ---No ?Guidelines ?Guideline Title Affirmed Question Affirmed Notes Nurse Date/Time (Eastern ?Time) ?Vomiting Vomiting is a chronic ?symptom (recurrent ?or ongoing AND ?present > 4 weeks) ?Glean Salvo, RN, Magda Paganini 07/01/2021 12:07:23 ?PM ?Disp. Time (Eastern ?Time) Disposition Final User ?07/01/2021 12:13:22 PM See PCP within 2 Weeks Yes Glean Salvo, RN,  Magda Paganini ?PLEASE NOTE: All timestamps contained within this report are represented as [place] Standard Time. ?CONFIDENTIALTY NOTICE: This fax transmission is intended only for the addressee. It contains information that is legally privileged, confidential or ?otherwise protected from use or disclosure. If you are not the intended recipient, you are strictly prohibited from reviewing, disclosing, copying using ?or disseminating any of this information or taking any action in reliance on or regarding this information. If you have received this fax in error, please ?notify us immediately by telephone so that we can arrange for its return to [place]. Phone: [phone], Toll-Free: [phone], Fax: [phone] ?Page: 2 of 2 ?Call Id: [id number] ?Caller Disagree/Comply Comply ?Caller Understands Yes ?PreDisposition Call Doctor ?Care Advice Given Per Guideline ?SEE PCP WITHIN 2 WEEKS: * You need to be seen for this ongoing problem within the next 2 weeks. * PCP VISIT: Call your ?doctor (or NP/PA) during regular office hours and make an appointment. VOMITING DIARY: * Keep a diary of your vomiting: ?Include the date, time, place, and what you ate in the previous 2 hours. CARE ADVICE per Vomiting (Adult) guideline. * You ?become worse CALL BACK IF: * Reason: Try to find some of the triggers ?

## 2021-07-01 NOTE — Telephone Encounter (Signed)
Okay ?It doesn't sound like any action is needed now ?

## 2021-07-01 NOTE — Telephone Encounter (Signed)
I spoke with pt; pt said since mid Jan - med Feb pt had a lot of nausea and some vomiting; pt said for the last wk she has vomited on and off (x3) and the last time vomited (white frothy vomitus) was this morning when the [facility] nurse was there and pt thinks [place] panicked and called PCP. Pt said usually if she eats about every 2 hrs she does not have vomiting but if she gets busy or forgets to eat then she may have vomiting. No blood seen. No abd pain and no diarrhea or constipation; no dry mouth. Pt does not have fever. Pt has eaten since she vomited earlier today and she feels OK right now. Offered to schedule pt an appt and pt said she has appt to see Dr Silvio Pate in July and will wait until then unless condition changes or worsens and then pt would call for appt. UC & ED precautions given and pt voiced understanding. Nothing further needed at this time. Sending note to Dr Silvio Pate and Larene Beach CMA. ?

## 2021-07-05 ENCOUNTER — Ambulatory Visit (INDEPENDENT_AMBULATORY_CARE_PROVIDER_SITE_OTHER): Payer: Medicare Other | Admitting: Dermatology

## 2021-07-05 DIAGNOSIS — I2510 Atherosclerotic heart disease of native coronary artery without angina pectoris: Secondary | ICD-10-CM | POA: Diagnosis not present

## 2021-07-05 DIAGNOSIS — I872 Venous insufficiency (chronic) (peripheral): Secondary | ICD-10-CM | POA: Diagnosis not present

## 2021-07-05 DIAGNOSIS — N179 Acute kidney failure, unspecified: Secondary | ICD-10-CM | POA: Diagnosis not present

## 2021-07-05 DIAGNOSIS — L89892 Pressure ulcer of other site, stage 2: Secondary | ICD-10-CM | POA: Diagnosis not present

## 2021-07-05 DIAGNOSIS — N1831 Chronic kidney disease, stage 3a: Secondary | ICD-10-CM | POA: Diagnosis not present

## 2021-07-05 DIAGNOSIS — G9341 Metabolic encephalopathy: Secondary | ICD-10-CM | POA: Diagnosis not present

## 2021-07-05 DIAGNOSIS — I130 Hypertensive heart and chronic kidney disease with heart failure and stage 1 through stage 4 chronic kidney disease, or unspecified chronic kidney disease: Secondary | ICD-10-CM | POA: Diagnosis not present

## 2021-07-05 DIAGNOSIS — I5032 Chronic diastolic (congestive) heart failure: Secondary | ICD-10-CM | POA: Diagnosis not present

## 2021-07-05 MED ORDER — TACROLIMUS 0.1 % EX OINT: TOPICAL_OINTMENT | Freq: Two times a day (BID) | CUTANEOUS | 3 refills | Status: DC

## 2021-07-05 NOTE — Progress Notes (Signed)
? ?Follow-Up Visit ?  ?Subjective  ?Tina Pacheco is a 71 y.o. female who presents for the following: venous stasis dermatitis of both lower extremities (2 week follow up, patient reports improved but still a few scabs at lower legs. Prescribed doxycycline 100 mg bid 2 week course completed, tmc ointment still currently using twice daily for flares. Using neosporin for any open wounds. ).  Has appointment with vein specialist to see if she qualifies for lymphedema pumps. ? ?Patient reports some improvement ? ?The following portions of the chart were reviewed this encounter and updated as appropriate:   ?  ? ?Review of Systems: No other skin or systemic complaints except as noted in HPI or Assessment and Plan. ? ? ?Objective  ?Well appearing patient in no apparent distress; mood and affect are within normal limits. ? ?A focused examination was performed including b/l lower legs. Relevant physical exam findings are noted in the Assessment and Plan. ? ?b/l lower extremities ?woody edema with erythema, peau d'orange textural change bilateral lower legs,  crusted erosions x 2 on left pretibia  ? ? ?Assessment & Plan  ?Venous stasis dermatitis of both lower extremities ?b/l lower extremities ? ?Venous stasis dermatitis of both lower extremities ?lower legs bilateral ?  ?Chronic and persistent condition with duration or expected duration over one year. Condition is symptomatic/ bothersome to patient. Improving but not currently at goal. Discussed rash is caused by extensive and persistent leg swelling.  Will be difficult to clear up completely with persistent leg swelling present.  Pt unable to wear compression hose, needs to keep legs elevated, minimize salt intake. ?  ?Stasis in the legs causes chronic leg swelling, which may result in itchy or painful rashes, skin discoloration, skin texture changes, and sometimes ulceration.  Recommend daily graduated compression hose/stockings- easiest to put on first thing in  morning, remove at bedtime.  Elevate legs as much as possible. Avoid salt/sodium rich foods.  ?  ?Continue TMC 0.1 % apply to aa's using once daily for another 2 weeks, then prn flares. Avoid applying to face, groin, and axilla. Use as directed. Long-term use can cause thinning of the skin.  When no longer itchy or flared, then switch to moisturizer to areas daily  ? ?Start tacrolimus 0.1% ointment qd/bid after finishes 2 more weeks of TMC ointment. ? ?Continue  mupirocin ointment or an over the counter antibiotic ointment to any open sore and cover with band aid.  ?  ?Patient may restart her Cimzia since no evidence of active infection. Continue torsemide (doubled dose) as prescribed by PCP.  Also keep appointment with vascular surgery to get evaluated for lymphedema pumps. Pt states she tried [employer] and they did not work.  ?  ?Topical steroids (such as triamcinolone, fluocinolone, fluocinonide, mometasone, clobetasol, halobetasol, betamethasone, hydrocortisone) can cause thinning and lightening of the skin if they are used for too long in the same area. Your physician has selected the right strength medicine for your problem and area affected on the body. Please use your medication only as directed by your physician to prevent side effects.  ? ?tacrolimus (PROTOPIC) 0.1 % ointment - b/l lower extremities ?Apply topically 2 (two) times daily. Apply to aa's lower legs as needed for rash ? ?Related Medications ?doxycycline (MONODOX) 100 MG capsule ?Take 1 capsule (100 mg total) by mouth 2 (two) times daily for 14 days. Take with food ? ?triamcinolone ointment (KENALOG) 0.1 % ?Apply twice daily to affected areas of rash at legs.  Avoid applying to face, groin, and axilla. Use as directed. Long-term use can cause thinning of the skin. ? ? ?Return if symptoms worsen or fail to improve. ?I, Ruthell Rummage, CMA, am acting as scribe for Brendolyn Patty, MD. ? ?Documentation: I have reviewed the above documentation for  accuracy and completeness, and I agree with the above. ? ?Brendolyn Patty MD  ? ?

## 2021-07-05 NOTE — Patient Instructions (Addendum)
Continue TMC ointment - apply to affected areas of legs once daily for another 2 weeks. After , if no longer flared, start using moisturizer at locations daily. Avoid face, groin, under arms.  ?Topical steroids (such as triamcinolone, fluocinolone, fluocinonide, mometasone, clobetasol, halobetasol, betamethasone, hydrocortisone) can cause thinning and lightening of the skin if they are used for too long in the same area. Your physician has selected the right strength medicine for your problem and area affected on the body. Please use your medication only as directed by your physician to prevent side effects.  ? ?Start tacrolimus - apply to affected areas of lower legs twice daily for rash after 2 weeks of using triamcinolone ointment  ? ?Cut down on salt intake, continue to keep legs elevated.  ? ?Stasis in the legs causes chronic leg swelling, which may result in itchy or painful rashes, skin discoloration, skin texture changes, and sometimes ulceration.  Recommend daily graduated compression hose/stockings- easiest to put on first thing in morning, remove at bedtime.  Elevate legs as much as possible. Avoid salt/sodium rich foods. ? ? ? ? ? ? ?Gentle Skin Care Guide ? ?1. Bathe no more than once a day. ? ?2. Avoid bathing in hot water ? ?3. Use a mild soap like Dove, Vanicream, Cetaphil, CeraVe. Can use Lever 2000 or Cetaphil antibacterial soap ? ?4. Use soap only where you need it. On most days, use it under your arms, between your legs, and on your feet. Let the water rinse other areas unless visibly dirty. ? ?5. When you get out of the bath/shower, use a towel to gently blot your skin dry, don't rub it. ? ?6. While your skin is still a little damp, apply a moisturizing cream such as Vanicream, CeraVe, Cetaphil, Eucerin, Sarna lotion or plain Vaseline Jelly. For hands apply Neutrogena [place] Hand Cream or Excipial Hand Cream. ? ?7. Reapply moisturizer any time you start to itch or feel dry. ? ?8. Sometimes  using free and clear laundry detergents can be helpful. Fabric softener sheets should be avoided. Downy Free & Gentle liquid, or any liquid fabric softener that is free of dyes and perfumes, it acceptable to use ? ?9. If your doctor has given you prescription creams you may apply moisturizers over them  ? ? ? ? ? ? ? ?If You Need Anything After Your Visit ? ?If you have any questions or concerns for your doctor, please call our main line at [phone] and press option 4 to reach your doctor's medical assistant. If no one answers, please leave a voicemail as directed and we will return your call as soon as possible. Messages left after 4 pm will be answered the following business day.  ? ?You may also send [place] a message via MyChart. We typically respond to MyChart messages within 1-2 business days. ? ?For prescription refills, please ask your pharmacy to contact our office. Our fax number is [phone]. ? ?If you have an urgent issue when the clinic is closed that cannot wait until the next business day, you can page your doctor at the number below.   ? ?Please note that while we do our best to be available for urgent issues outside of office hours, we are not available 24/7.  ? ?If you have an urgent issue and are unable to reach [place], you may choose to seek medical care at your doctor's office, retail clinic, urgent care center, or emergency room. ? ?If you have a medical emergency, please immediately  call 911 or go to the emergency department. ? ?Pager Numbers ? ?- Dr. Nehemiah Massed: [phone] ? ?- Dr. Laurence Ferrari: [phone] ? ?- Dr. Nicole Kindred: [phone] ? ?In the event of inclement weather, please call our main line at [phone] for an update on the status of any delays or closures. ? ?Dermatology Medication Tips: ?Please keep the boxes that topical medications come in in order to help keep track of the instructions about where and how to use these. Pharmacies typically print the medication instructions only on  the boxes and not directly on the medication tubes.  ? ?If your medication is too expensive, please contact our office at [phone] option 4 or send [place] a message through [place].  ? ?We are unable to tell what your co-pay for medications will be in advance as this is different depending on your insurance coverage. However, we may be able to find a substitute medication at lower cost or fill out paperwork to get insurance to cover a needed medication.  ? ?If a prior authorization is required to get your medication covered by your insurance company, please allow [place] 1-2 business days to complete this process. ? ?Drug prices often vary depending on where the prescription is filled and some pharmacies may offer cheaper prices. ? ?The website www.goodrx.com contains coupons for medications through different pharmacies. The prices here do not account for what the cost may be with help from insurance (it may be cheaper with your insurance), but the website can give you the price if you did not use any insurance.  ?- You can print the associated coupon and take it with your prescription to the pharmacy.  ?- You may also stop by our office during regular business hours and pick up a GoodRx coupon card.  ?- If you need your prescription sent electronically to a different pharmacy, notify our office through [facility] or by phone at [phone] option 4. ? ? ? ? ?Si Usted Necesita Algo Despu?s de Su Visita ? ?Tambi?n puede enviarnos un mensaje a trav?s de MyChart. Por lo general respondemos a los mensajes de MyChart en el transcurso de 1 a 2 d?as h?biles. ? ?Para renovar recetas, por favor pida a su farmacia que se ponga en contacto con nuestra oficina. Nuestro n?mero de fax es el [phone]. ? ?Si tiene un asunto urgente cuando la cl?nica est? cerrada y que no puede esperar hasta el siguiente d?a h?bil, puede llamar/localizar a su doctor(a) al n?mero que aparece a continuaci?n.  ? ?Por favor, tenga en cuenta que  aunque hacemos todo lo posible para estar disponibles para asuntos urgentes fuera del horario de oficina, no estamos disponibles las 24 horas del d?a, los 7 d?as de la semana.  ? ?Si tiene un problema urgente y no puede comunicarse con nosotros, puede optar por buscar atenci?n m?dica  en el consultorio de su doctor(a), en una cl?nica privada, en un centro de atenci?n urgente o en una sala de emergencias. ? ?Si tiene Engineer, maintenance (IT) m?dica, por favor llame inmediatamente al 911 o vaya a la sala de emergencias. ? ?N?meros de b?per ? ?- Dr. Nehemiah Massed: [phone] ? ?- Dra. Moye: [phone] ? ?- Dra. Nicole Kindred: [phone] ? ?En caso de inclemencias del tiempo, por favor llame a nuestra l?nea principal al [phone] para una actualizaci?n sobre el estado de cualquier retraso o cierre. ? ?Consejos para la medicaci?n en dermatolog?a: ?Por favor, guarde las cajas en las que vienen los medicamentos de uso t?pico para ayudarle a seguir las instrucciones  sobre d?nde y c?mo usarlos. Las farmacias generalmente imprimen las instrucciones del medicamento s?lo en las cajas y no directamente en los tubos del [place].  ? ?Si su medicamento es muy caro, por favor, p?ngase en contacto con Zigmund Daniel llamando al [phone] y presione la opci?n 4 o env?enos un mensaje a trav?s de MyChart.  ? ?No podemos decirle cu?l ser? su copago por los medicamentos por adelantado ya que esto es diferente dependiendo de la cobertura de su seguro. Sin embargo, es posible que podamos encontrar un medicamento sustituto a Electrical engineer un formulario para que el seguro cubra el medicamento que se considera necesario.  ? ?Si se requiere Ardelia Mems autorizaci?n previa para que su compa??a de seguros [place] su medicamento, por favor perm?tanos de 1 a 2 d?as h?biles para completar este proceso. ? ?Los precios de los medicamentos var?an con frecuencia dependiendo del Environmental consultant de d?nde se surte la receta y alguna farmacias pueden ofrecer precios m?s  baratos. ? ?El sitio web www.goodrx.com tiene cupones para medicamentos de Airline pilot. Los precios aqu? no tienen en cuenta lo que podr?a costar con la ayuda del seguro (puede ser m?s barato con s

## 2021-07-07 DIAGNOSIS — L89892 Pressure ulcer of other site, stage 2: Secondary | ICD-10-CM | POA: Diagnosis not present

## 2021-07-07 DIAGNOSIS — N1831 Chronic kidney disease, stage 3a: Secondary | ICD-10-CM | POA: Diagnosis not present

## 2021-07-07 DIAGNOSIS — N179 Acute kidney failure, unspecified: Secondary | ICD-10-CM | POA: Diagnosis not present

## 2021-07-07 DIAGNOSIS — I130 Hypertensive heart and chronic kidney disease with heart failure and stage 1 through stage 4 chronic kidney disease, or unspecified chronic kidney disease: Secondary | ICD-10-CM | POA: Diagnosis not present

## 2021-07-07 DIAGNOSIS — G9341 Metabolic encephalopathy: Secondary | ICD-10-CM | POA: Diagnosis not present

## 2021-07-07 DIAGNOSIS — I5032 Chronic diastolic (congestive) heart failure: Secondary | ICD-10-CM | POA: Diagnosis not present

## 2021-07-08 ENCOUNTER — Encounter (INDEPENDENT_AMBULATORY_CARE_PROVIDER_SITE_OTHER): Payer: Self-pay | Admitting: Vascular Surgery

## 2021-07-08 ENCOUNTER — Ambulatory Visit (INDEPENDENT_AMBULATORY_CARE_PROVIDER_SITE_OTHER): Payer: Medicare Other | Admitting: Vascular Surgery

## 2021-07-08 VITALS — BP 120/81 | HR 66 | Resp 16 | Ht 63.0 in | Wt 284.0 lb

## 2021-07-08 DIAGNOSIS — Z1211 Encounter for screening for malignant neoplasm of colon: Secondary | ICD-10-CM | POA: Insufficient documentation

## 2021-07-08 DIAGNOSIS — I890 Lymphedema, not elsewhere classified: Secondary | ICD-10-CM | POA: Diagnosis not present

## 2021-07-08 DIAGNOSIS — I130 Hypertensive heart and chronic kidney disease with heart failure and stage 1 through stage 4 chronic kidney disease, or unspecified chronic kidney disease: Secondary | ICD-10-CM | POA: Diagnosis not present

## 2021-07-08 DIAGNOSIS — N1832 Chronic kidney disease, stage 3b: Secondary | ICD-10-CM

## 2021-07-08 DIAGNOSIS — Z6841 Body Mass Index (BMI) 40.0 and over, adult: Secondary | ICD-10-CM | POA: Diagnosis not present

## 2021-07-08 DIAGNOSIS — R635 Abnormal weight gain: Secondary | ICD-10-CM | POA: Insufficient documentation

## 2021-07-08 DIAGNOSIS — R1032 Left lower quadrant pain: Secondary | ICD-10-CM | POA: Insufficient documentation

## 2021-07-08 DIAGNOSIS — E782 Mixed hyperlipidemia: Secondary | ICD-10-CM | POA: Diagnosis not present

## 2021-07-08 DIAGNOSIS — I2510 Atherosclerotic heart disease of native coronary artery without angina pectoris: Secondary | ICD-10-CM

## 2021-07-08 DIAGNOSIS — R21 Rash and other nonspecific skin eruption: Secondary | ICD-10-CM | POA: Insufficient documentation

## 2021-07-08 DIAGNOSIS — N1831 Chronic kidney disease, stage 3a: Secondary | ICD-10-CM | POA: Diagnosis not present

## 2021-07-08 DIAGNOSIS — R1111 Vomiting without nausea: Secondary | ICD-10-CM | POA: Insufficient documentation

## 2021-07-08 DIAGNOSIS — N179 Acute kidney failure, unspecified: Secondary | ICD-10-CM | POA: Diagnosis not present

## 2021-07-08 DIAGNOSIS — L89892 Pressure ulcer of other site, stage 2: Secondary | ICD-10-CM | POA: Diagnosis not present

## 2021-07-08 DIAGNOSIS — G9341 Metabolic encephalopathy: Secondary | ICD-10-CM | POA: Diagnosis not present

## 2021-07-08 DIAGNOSIS — I5032 Chronic diastolic (congestive) heart failure: Secondary | ICD-10-CM | POA: Diagnosis not present

## 2021-07-08 DIAGNOSIS — I10 Essential (primary) hypertension: Secondary | ICD-10-CM

## 2021-07-08 DIAGNOSIS — K5900 Constipation, unspecified: Secondary | ICD-10-CM | POA: Insufficient documentation

## 2021-07-08 DIAGNOSIS — K625 Hemorrhage of anus and rectum: Secondary | ICD-10-CM | POA: Insufficient documentation

## 2021-07-08 DIAGNOSIS — R194 Change in bowel habit: Secondary | ICD-10-CM | POA: Insufficient documentation

## 2021-07-08 NOTE — Assessment & Plan Note (Signed)
blood pressure control important in reducing the progression of atherosclerotic disease. On appropriate oral medications.  

## 2021-07-08 NOTE — Assessment & Plan Note (Signed)
lipid control important in reducing the progression of atherosclerotic disease. Continue statin therapy  

## 2021-07-08 NOTE — Assessment & Plan Note (Signed)
Worsens LE swelling 

## 2021-07-08 NOTE — Assessment & Plan Note (Signed)
Cardiac function is a major player in her lower extremity swelling. ?

## 2021-07-08 NOTE — Assessment & Plan Note (Signed)
Weight loss would be of significant benefit for her leg swelling ?

## 2021-07-08 NOTE — Progress Notes (Signed)
? ? ?MRN : [id number] ? ?Tina Pacheco is a 71 y.o. (17-Feb-1951) female who presents with chief complaint of  ?Chief Complaint  ?Patient presents with  ? New Patient (Initial Visit)  ?  lymphedema  ?. ? ?History of Present Illness: Patient returns today in follow up of her lymphedema and leg swelling.  She is still bothered by significant amount of swelling in both legs with intermittent weight loss and gain from her renal insufficiency and cardiac issues.  Her legs are currently about as good as they get, but she still has 1-2+ swelling with peau d'orange changes to her skin but no open ulcerations.  Her legs are heavy and her ambulation is very poor at this point.  She and her husband had a very difficult time getting compression socks on and her daughter asks about the Velcro compression system which I think would work very well for her. ? ?Current Outpatient Medications  ?Medication Sig Dispense Refill  ? amiodarone (PACERONE) 200 MG tablet Take 1 tablet (200 mg total) by mouth at bedtime. GIVE ONE TABLET BY AT BEDTIME 30 tablet 0  ? apixaban (ELIQUIS) 5 MG TABS tablet TAKE 1 TABLET(5 MG) BY MOUTH TWICE DAILY 60 tablet 0  ? atorvastatin (LIPITOR) 40 MG tablet TAKE 1 TABLET(40 MG) BY MOUTH DAILY 90 tablet 3  ? ketoconazole (NIZORAL) 2 % cream APPLY A SMALL AMOUNT TO AFFECTED AREA ONCE A DAY TO RASH ON BUTTOCKS, GROIN 60 g 1  ? leflunomide (ARAVA) 20 MG tablet Take 1 tablet (20 mg total) by mouth daily. 30 tablet 0  ? metolazone (ZAROXOLYN) 2.5 MG tablet TAKE 1 TABLET BY MOUTH DAILY AS NEEDED MAY GIVE 1 TABLET AS NEEDED FOR WEIGHT GAIN OF 5 LBS IN 1 WEEK 30 tablet 11  ? metoprolol succinate (TOPROL-XL) 50 MG 24 hr tablet TAKE 1 TABLET BY MOUTH EVERY DAY WITH OR IMMEDIATELY FOLLOWING A MEAL 90 tablet 3  ? montelukast (SINGULAIR) 10 MG tablet TAKE 1 TABLET BY MOUTH AT BEDTIME 90 tablet 3  ? nitroGLYCERIN (NITROSTAT) 0.4 MG SL tablet Place 1 tablet (0.4 mg total) under the tongue every 5 (five) minutes as needed for  chest pain. 20 tablet 12  ? nystatin (MYCOSTATIN/NYSTOP) powder Apply 1 application topically 3 (three) times daily as needed. 60 g 0  ? Oxycodone HCl 20 MG TABS Take 1 tablet (20 mg total) by mouth in the morning and at bedtime. 60 tablet 0  ? polyethylene glycol (MIRALAX / GLYCOLAX) packet Take 17 g by mouth daily as needed for mild constipation. 14 each 0  ? Potassium Chloride ER 20 MEQ TBCR Take 20 mEq by mouth daily at 12 noon. (Patient taking differently: Take 40 mEq by mouth 2 (two) times daily.) 30 tablet 0  ? spironolactone (ALDACTONE) 25 MG tablet TAKE 1 TABLET(25 MG) BY MOUTH DAILY 30 tablet 11  ? tacrolimus (PROTOPIC) 0.1 % ointment Apply topically 2 (two) times daily. Apply to aa's lower legs as needed for rash 100 g 3  ? terazosin (HYTRIN) 10 MG capsule TAKE 1 CAPSULE(10 MG) BY MOUTH AT BEDTIME 90 capsule 3  ? torsemide (DEMADEX) 20 MG tablet Take 2 tablets (40 mg total) by mouth 2 (two) times daily. 360 tablet 3  ? triamcinolone ointment (KENALOG) 0.1 % Apply twice daily to affected areas of rash at legs. Avoid applying to face, groin, and axilla. Use as directed. Long-term use can cause thinning of the skin. 454 g 0  ? leflunomide (ARAVA) 20 MG  tablet Take 1 tablet by mouth daily. (Patient not taking: Reported on 07/08/2021)    ? silver sulfADIAZINE (SILVADENE) 1 % cream Apply 1 application. topically daily. Apply 1 gram to infected area once a day (Patient not taking: Reported on 07/08/2021) 50 g 0  ? ?No current facility-administered medications for this visit.  ? ? ?Past Medical History:  ?Diagnosis Date  ? Anemia   ? Arthritis   ? RA  ? Asthma   ? Basal cell carcinoma   ? Cataract 2018  ? bilateral eyes; corrected with surgery  ? Chronic diastolic CHF (congestive heart failure) ([place])   ? Claustrophobia   ? Collagen vascular disease ([place])   ? RA  ? COPD (chronic obstructive pulmonary disease) ([place])   ? Diastolic dysfunction   ? a. echo 07/2014: EF 55-60%, no RWMA, GR2DD, mild MR, LA moderately  dilated, PASP 38 mm Hg  ? Dyslipidemia   ? Headache   ? migraines  ? Hemihypertrophy   ? History of cardiac cath   ? 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 History of gout   ? Hyperplastic colonic polyp 2003  ? Hypertension   ? Hypokalemia   ? Morbid obesity ([place])   ? PAF (paroxysmal atrial fibrillation) ([place])   ? a. on Pradaxa; b. CHADSVASc at least 2 (HTN & female)  ? Rheumatoid arthritis(714.0)   ? Sleep apnea   ? a. not compliant with CPAP  ? Stage 3 chronic kidney disease ([place])   ? ? ?Past Surgical History:  ?Procedure Laterality Date  ? ABDOMINAL HYSTERECTOMY    ? APPLICATION OF WOUND VAC Right 08/09/2017  ? Procedure: APPLICATION OF WOUND VAC;  Surgeon: Albertine Patricia, DPM;  Location: [facility];  Service: Podiatry;  Laterality: Right;  ? CARDIAC CATHETERIZATION  05/24/2010  ? nonobstructive CAD  ? CATARACT EXTRACTION W/ INTRAOCULAR LENS IMPLANT Right 06/12/2016  ? Dr. Darleen Crocker  ? CATARACT EXTRACTION W/ INTRAOCULAR LENS IMPLANT Left 06/26/2016  ? Dr. Darleen Crocker  ? CESAREAN SECTION    ? CHOLECYSTECTOMY    ? COLONOSCOPY  06/12/2011  ? Procedure: COLONOSCOPY;  Surgeon: Juanita Craver, MD;  Location: [facility];  Service: Endoscopy;  Laterality: N/A;  ? COLONOSCOPY N/A 03/17/2013  ? Procedure: COLONOSCOPY;  Surgeon: Juanita Craver, MD;  Location: [facility];  Service: Endoscopy;  Laterality: N/A;  ? EYE SURGERY    ? FOOT ARTHRODESIS Right 07/13/2017  ? Procedure: ARTHRODESIS FOOT-MULTI.FUSIONS (6 JOINTS);  Surgeon: Albertine Patricia, DPM;  Location: [facility];  Service: Podiatry;  Laterality: Right;  ? IRRIGATION AND DEBRIDEMENT FOOT Right 08/09/2017  ? Procedure: IRRIGATION AND DEBRIDEMENT FOOT;  Surgeon: Albertine Patricia, DPM;  Location: [facility];  Service: Podiatry;  Laterality: Right;  ? KNEE ARTHROSCOPY    ? bilateral  ? LEFT HEART CATH AND CORONARY ANGIOGRAPHY N/A 06/04/2020  ? Procedure: LEFT HEART CATH AND CORONARY ANGIOGRAPHY;  Surgeon: Wellington Hampshire, MD;  Location: [facility];   Service: Cardiovascular;  Laterality: N/A;  ? REVERSE SHOULDER ARTHROPLASTY Right 11/16/2020  ? Procedure: REVERSE SHOULDER ARTHROPLASTY;  Surgeon: Corky Mull, MD;  Location: [facility];  Service: Orthopedics;  Laterality: Right;  ? SHOULDER CLOSED REDUCTION Right 11/11/2020  ? Procedure: CLOSED REDUCTION SHOULDER;  Surgeon: Corky Mull, MD;  Location: [facility];  Service: Orthopedics;  Laterality: Right;  ? SHOULDER INJECTION Right 11/11/2020  ? Procedure: SHOULDER INJECTION;  Surgeon: Corky Mull, MD;  Location: [facility];  Service: Orthopedics;  Laterality: Right;  ?  SINUSOTOMY    ? TOOTH EXTRACTION  12/2016  ? VAGINAL HYSTERECTOMY    ? ? ? ?Social History  ? ?Tobacco Use  ? Smoking status: Never  ?  Passive exposure: Current  ? Smokeless tobacco: Never  ?Vaping Use  ? Vaping Use: Never used  ?Substance Use Topics  ? Alcohol use: No  ? Drug use: No  ? ?  ? ? ?Family History  ?Problem Relation Age of Onset  ? Tuberculosis Mother   ? Parkinsonism Mother   ? Emphysema Father   ?     smoked  ? Arthritis Father   ? Cancer Sister   ? Diabetes type II Sister   ? Breast cancer Sister   ? Tuberculosis Sister   ? Breast cancer Maternal Aunt   ? Colon cancer Neg Hx   ? Esophageal cancer Neg Hx   ? Pancreatic cancer Neg Hx   ? Stomach cancer Neg Hx   ? ? ? ?Allergies  ?Allergen Reactions  ? Fluoxetine Other (See Comments)  ?  Headache, shaking, sleep issues ?Headache, shaking, sleep issues  ? Codeine   ?  Nausea and vomiting/only when taking too much  ? ? ? ?REVIEW OF SYSTEMS (Negative unless checked) ? ?Constitutional: '[]'$ Weight loss  '[]'$ Fever  '[]'$ Chills ?Cardiac: '[]'$ Chest pain   '[]'$ Chest pressure   '[]'$ Palpitations   '[]'$ Shortness of breath when laying flat   '[]'$ Shortness of breath at rest   '[x]'$ Shortness of breath with exertion. ?Vascular:  '[]'$ Pain in legs with walking   '[]'$ Pain in legs at rest   '[]'$ Pain in legs when laying flat   '[]'$ Claudication   '[]'$ Pain in feet when walking  '[]'$ Pain in feet at rest  '[]'$ Pain in feet when laying flat    '[]'$ History of DVT   '[]'$ Phlebitis   '[x]'$ Swelling in legs   '[]'$ Varicose veins   '[]'$ Non-healing ulcers ?Pulmonary:   '[]'$ Uses home oxygen   '[]'$ Productive cough   '[]'$ Hemoptysis   '[]'$ Wheeze  '[]'$ COPD   '[]'$ Asthma ?Neurologic:  '[]'$ D

## 2021-07-08 NOTE — Assessment & Plan Note (Signed)
The patient has stage II lymphedema with swelling refractory to compression and elevation.  I have written a prescription for the juxta lite Velcro compression system as I think this would help her more than compression socks.  The compression socks have helped some.  She would clearly benefit from a lymphedema pump and we will try to get that approved today as well.  She should continue to elevate her legs, lose weight, avoid salt, and be as active as possible.  Follow-up in 6 months. ?

## 2021-07-11 ENCOUNTER — Telehealth: Payer: Self-pay

## 2021-07-11 NOTE — Telephone Encounter (Signed)
Pt has a follow up appt scheduled for tomorrow. Note has been added to pt's appt that she needs labs. ?

## 2021-07-11 NOTE — Telephone Encounter (Signed)
-----   Message from Yevette Edwards, RN sent at 04/12/2021 11:47 AM EST ----- ?Regarding: Labs ?CBC, need to enter order ? ?

## 2021-07-12 ENCOUNTER — Ambulatory Visit (INDEPENDENT_AMBULATORY_CARE_PROVIDER_SITE_OTHER): Payer: Medicare Other | Admitting: Gastroenterology

## 2021-07-12 ENCOUNTER — Encounter: Payer: Self-pay | Admitting: Gastroenterology

## 2021-07-12 ENCOUNTER — Other Ambulatory Visit: Payer: Medicare Other

## 2021-07-12 VITALS — BP 128/70 | HR 50 | Ht 63.0 in | Wt 279.2 lb

## 2021-07-12 DIAGNOSIS — N179 Acute kidney failure, unspecified: Secondary | ICD-10-CM | POA: Diagnosis not present

## 2021-07-12 DIAGNOSIS — I2510 Atherosclerotic heart disease of native coronary artery without angina pectoris: Secondary | ICD-10-CM | POA: Diagnosis not present

## 2021-07-12 DIAGNOSIS — R1310 Dysphagia, unspecified: Secondary | ICD-10-CM | POA: Diagnosis not present

## 2021-07-12 DIAGNOSIS — Z8619 Personal history of other infectious and parasitic diseases: Secondary | ICD-10-CM | POA: Diagnosis not present

## 2021-07-12 DIAGNOSIS — F1190 Opioid use, unspecified, uncomplicated: Secondary | ICD-10-CM

## 2021-07-12 DIAGNOSIS — R110 Nausea: Secondary | ICD-10-CM

## 2021-07-12 DIAGNOSIS — N1831 Chronic kidney disease, stage 3a: Secondary | ICD-10-CM | POA: Diagnosis not present

## 2021-07-12 DIAGNOSIS — G9341 Metabolic encephalopathy: Secondary | ICD-10-CM | POA: Diagnosis not present

## 2021-07-12 DIAGNOSIS — L89892 Pressure ulcer of other site, stage 2: Secondary | ICD-10-CM | POA: Diagnosis not present

## 2021-07-12 DIAGNOSIS — I130 Hypertensive heart and chronic kidney disease with heart failure and stage 1 through stage 4 chronic kidney disease, or unspecified chronic kidney disease: Secondary | ICD-10-CM | POA: Diagnosis not present

## 2021-07-12 DIAGNOSIS — I5032 Chronic diastolic (congestive) heart failure: Secondary | ICD-10-CM | POA: Diagnosis not present

## 2021-07-12 MED ORDER — FDGARD 25-20.75 MG PO CAPS: ORAL_CAPSULE | ORAL | 0 refills | Status: DC

## 2021-07-12 MED ORDER — ONDANSETRON 4 MG PO TBDP: 4.0000 mg | ORAL_TABLET | Freq: Three times a day (TID) | ORAL | 1 refills | Status: DC | PRN

## 2021-07-12 NOTE — Patient Instructions (Addendum)
If you are age 72 or older, your body mass index should be between 23-30. Your Body mass index is 49.46 kg/m?Marland Kitchen If this is out of the aforementioned range listed, please consider follow up with your Primary Care Provider. ? ?If you are age 71 or younger, your body mass index should be between 19-25. Your Body mass index is 49.46 kg/m?Marland Kitchen If this is out of the aformentioned range listed, please consider follow up with your Primary Care Provider.  ? ?________________________________________________________ ? ?The [place] [facility] providers would like to encourage you to use [facility] to communicate with providers for non-urgent requests or questions.  Due to long hold times on the telephone, sending your provider a message by [facility] may be a faster and more efficient way to get a response.  Please allow 48 business hours for a response.  Please remember that this is for non-urgent requests.  ?_______________________________________________________ ? ?Please go to the lab at [facility] to have lab work done.  We will call you with the results. Thank you. ? ?We have given you samples of the following medication to take: ?FDGard - Take as directed ? ?We have sent the following medications to your pharmacy for you to pick up at your convenience: ?Zofran 4 mg ODT ? ?Thank you for entrusting me with your care and for choosing [employer], ?Dr. [place] Cellar ? ? ? ? ? ?

## 2021-07-12 NOTE — Progress Notes (Signed)
? ?HPI :  ?71 year old female with multiple medical problems to include a history of diastolic heart failure, COPD, morbid obesity with BMI 52, A-fib on Eliquis, immobility, here for a follow up visit. ? ?Please see intake visit from February 13 for full details.  Recall she had had problems with frequent nausea, poor appetite, dysphagia intermittently, this occurring in the setting of chronic narcotic use for chronic back pain.  I had recommended that she start omeprazole 40 mg a day empirically, we added Phenergan for her nausea as she did not think Zofran helped too much, I had recommended ultimately coming down and stopping narcotics.  We performed blood work and did an H. pylori IgG, and coordinated an upper GI series. ? ?Unfortunately when Phenergan was added to her regimen, after a few weeks, she was admitted to the hospital with encephalopathy which was thought to be due to polypharmacy in light of numerous medication she was on, Phenergan likely contributing to this.  This was stopped as well as her gabapentin and meclizine and her mental status has been back to normal and doing okay. ? ?She was found to have an H. pylori IgG serology that was positive.  She was treated with an antibiotic regimen including amoxicillin, clarithromycin, and Flagyl, as well as twice daily omeprazole.  Daughter states that she felt better after having been treated with the regimen.  She had an upper GI series performed in March which showed evidence of dysmotility but no focal stenosis or stricture.  Reflux noted but no other concerning pathology.  We decided to hold off on endoscopy and treat medically. ? ?Generally since the last time of seeing her her symptoms of nausea and swallowing are much less than it had been previously.  She still gets nauseated from time to time, it can come and go.  Not always related to eating.  She does not usually vomit, can have some regurgitation with this.  She states her dysphagia is mild at  this time and not too bothersome, compared to the way it was before.  She has been able to titrate down her narcotics dosing, still on oxycodone but dosing is less, anywhere from 5 to 10 mg every 12 hours, previously on 20 mg every 12 hours.  She has not yet been able to submit a stool test for H. pylori eradication testing.  We discussed if she wanted to have an endoscopy at some point time to evaluate the symptoms, how aggressive she wanted to be.  She had a CT scan in February of her abdomen and pelvis which did not show any concerning pathology. ? ? ?Nuclear stress test 06/01/20 - Abnormal pharmacologic myocardial perfusion stress test. ?There is a large in size, moderate in severity, completely reversible defect involving the mid anterior, anterolateral, and inferolateral segments, as well as the apical anterior and lateral segments. Defect is concerning for ischemia, though artifact cannot be excluded (shifting breast attenuation). ?Left ventricular systolic function is normal (LVEF 58%). ?Attenuation correction CT shows coronary artery calcification, aortic atherosclerosis, and dense mitral annular calcification. ?This is an intermediate risk study. ?  ?Cardiac cath 06/04/20: ?Dist LAD lesion is 30% stenosed. ?Ost LAD to Mid LAD lesion is 40% stenosed. ?  ?1.  No evidence of obstructive coronary artery disease.  Relatively stable mild to moderate proximal LAD disease. ?2.  Left ventricular angiography was not performed.  EF is normal by echo. ?3.  Mildly elevated left ventricular end-diastolic pressure. ?  ?  ?  ?  CT angio abdomen / pelvis 01/16/2018: ?IMPRESSION: ?No evidence of aortic aneurysm or dissection. Mild atherosclerotic ?disease in the distal descending thoracic aorta and throughout the ?abdominal aorta and iliac vessels. ?Mild airway thickening and lower lobe bronchiectasis. Early ?peripheral fibrosis. Appearance of the lungs is stable dating back ?to 2017. ?No acute findings in the abdomen or  pelvis. ?Moderate stool burden throughout the colon. ?  ?Colonoscopy -03-17-2013 -Dr. Collene Mares -adequate prep, normal exam, internal hemorrhoids ?  ?CT scan abdomen / pelvis with contrast 04/20/21: ?IMPRESSION: ?1. Evidence of prior cholecystectomy and hysterectomy. ?2. Multilevel marked severity degenerative changes throughout the ?lumbar spine. ?3. Aortic atherosclerosis. ? ?06/13/21 - Hgb 11.1, MCV 95.7 ? ? ?Barium swallow 05/25/21: ?IMPRESSION: ?1. No esophageal stricture. ?2. Esophageal dysmotility with associated tertiary contractions seen ?throughout the cervical and thoracic esophagus as can be seen with ?spasm versus presbyesophagus. ?3. Minimal amount of spontaneous gastroesophageal reflux. ?4. No evidence of hiatal hernia. ?5. Otherwise normal upper GI. ? ? ?Past Medical History:  ?Diagnosis Date  ? Anemia   ? Arthritis   ? RA  ? Asthma   ? Basal cell carcinoma   ? Cataract 2018  ? bilateral eyes; corrected with surgery  ? Chronic diastolic CHF (congestive heart failure) ([place])   ? Claustrophobia   ? Collagen vascular disease ([place])   ? RA  ? COPD (chronic obstructive pulmonary disease) ([place])   ? Diastolic dysfunction   ? a. echo 07/2014: EF 55-60%, no RWMA, GR2DD, mild MR, LA moderately dilated, PASP 38 mm Hg  ? Dyslipidemia   ? Headache   ? migraines  ? Hemihypertrophy   ? History of cardiac cath   ? 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 History of gout   ? Hyperplastic colonic polyp 2003  ? Hypertension   ? Hypokalemia   ? Morbid obesity ([place])   ? PAF (paroxysmal atrial fibrillation) ([place])   ? a. on Pradaxa; b. CHADSVASc at least 2 (HTN & female)  ? Rheumatoid arthritis(714.0)   ? Sleep apnea   ? a. not compliant with CPAP  ? Stage 3 chronic kidney disease ([place])   ? ? ? ?Past Surgical History:  ?Procedure Laterality Date  ? ABDOMINAL HYSTERECTOMY    ? APPLICATION OF WOUND VAC Right 08/09/2017  ? Procedure: APPLICATION OF WOUND VAC;  Surgeon: Albertine Patricia, DPM;  Location: [facility];  Service:  Podiatry;  Laterality: Right;  ? CARDIAC CATHETERIZATION  05/24/2010  ? nonobstructive CAD  ? CATARACT EXTRACTION W/ INTRAOCULAR LENS IMPLANT Right 06/12/2016  ? Dr. Darleen Crocker  ? CATARACT EXTRACTION W/ INTRAOCULAR LENS IMPLANT Left 06/26/2016  ? Dr. Darleen Crocker  ? CESAREAN SECTION    ? CHOLECYSTECTOMY    ? COLONOSCOPY  06/12/2011  ? Procedure: COLONOSCOPY;  Surgeon: Juanita Craver, MD;  Location: [facility];  Service: Endoscopy;  Laterality: N/A;  ? COLONOSCOPY N/A 03/17/2013  ? Procedure: COLONOSCOPY;  Surgeon: Juanita Craver, MD;  Location: [facility];  Service: Endoscopy;  Laterality: N/A;  ? EYE SURGERY    ? FOOT ARTHRODESIS Right 07/13/2017  ? Procedure: ARTHRODESIS FOOT-MULTI.FUSIONS (6 JOINTS);  Surgeon: Albertine Patricia, DPM;  Location: [facility];  Service: Podiatry;  Laterality: Right;  ? IRRIGATION AND DEBRIDEMENT FOOT Right 08/09/2017  ? Procedure: IRRIGATION AND DEBRIDEMENT FOOT;  Surgeon: Albertine Patricia, DPM;  Location: [facility];  Service: Podiatry;  Laterality: Right;  ? KNEE ARTHROSCOPY    ? bilateral  ? LEFT HEART CATH AND CORONARY ANGIOGRAPHY N/A 06/04/2020  ? Procedure:  LEFT HEART CATH AND CORONARY ANGIOGRAPHY;  Surgeon: Wellington Hampshire, MD;  Location: [facility];  Service: Cardiovascular;  Laterality: N/A;  ? REVERSE SHOULDER ARTHROPLASTY Right 11/16/2020  ? Procedure: REVERSE SHOULDER ARTHROPLASTY;  Surgeon: Corky Mull, MD;  Location: [facility];  Service: Orthopedics;  Laterality: Right;  ? SHOULDER CLOSED REDUCTION Right 11/11/2020  ? Procedure: CLOSED REDUCTION SHOULDER;  Surgeon: Corky Mull, MD;  Location: [facility];  Service: Orthopedics;  Laterality: Right;  ? SHOULDER INJECTION Right 11/11/2020  ? Procedure: SHOULDER INJECTION;  Surgeon: Corky Mull, MD;  Location: [facility];  Service: Orthopedics;  Laterality: Right;  ? SINUSOTOMY    ? TOOTH EXTRACTION  12/2016  ? VAGINAL HYSTERECTOMY    ? ?Family History  ?Problem Relation Age of Onset  ? Tuberculosis Mother   ? Parkinsonism  Mother   ? Emphysema Father   ?     smoked  ? Arthritis Father   ? Cancer Sister   ? Diabetes type II Sister   ? Breast cancer Sister   ? Tuberculosis Sister   ? Breast cancer Maternal Aunt   ? Colon cancer Neg Hx

## 2021-07-13 ENCOUNTER — Telehealth: Payer: Self-pay

## 2021-07-13 DIAGNOSIS — Z96611 Presence of right artificial shoulder joint: Secondary | ICD-10-CM | POA: Diagnosis not present

## 2021-07-13 DIAGNOSIS — M75121 Complete rotator cuff tear or rupture of right shoulder, not specified as traumatic: Secondary | ICD-10-CM | POA: Diagnosis not present

## 2021-07-13 DIAGNOSIS — M24411 Recurrent dislocation, right shoulder: Secondary | ICD-10-CM | POA: Diagnosis not present

## 2021-07-13 NOTE — Telephone Encounter (Signed)
Pt's daughter has spoke to Gerarda Gunther at [employer] in [place] about getting her a 75 in manual wheelchair. They need an order from [place] faxed to [phone]. Call pt once it is faxed. ?

## 2021-07-14 DIAGNOSIS — G9341 Metabolic encephalopathy: Secondary | ICD-10-CM | POA: Diagnosis not present

## 2021-07-14 DIAGNOSIS — N179 Acute kidney failure, unspecified: Secondary | ICD-10-CM | POA: Diagnosis not present

## 2021-07-14 DIAGNOSIS — N1831 Chronic kidney disease, stage 3a: Secondary | ICD-10-CM | POA: Diagnosis not present

## 2021-07-14 DIAGNOSIS — I130 Hypertensive heart and chronic kidney disease with heart failure and stage 1 through stage 4 chronic kidney disease, or unspecified chronic kidney disease: Secondary | ICD-10-CM | POA: Diagnosis not present

## 2021-07-14 DIAGNOSIS — L89892 Pressure ulcer of other site, stage 2: Secondary | ICD-10-CM | POA: Diagnosis not present

## 2021-07-14 DIAGNOSIS — I5032 Chronic diastolic (congestive) heart failure: Secondary | ICD-10-CM | POA: Diagnosis not present

## 2021-07-14 NOTE — Telephone Encounter (Signed)
Spoke to pt. Advised her I was faxing the rx to [facility] now.

## 2021-07-19 ENCOUNTER — Other Ambulatory Visit
Admission: RE | Admit: 2021-07-19 | Discharge: 2021-07-19 | Disposition: A | Discharge status: Home / Self Care | Payer: Medicare Other | Source: Ambulatory Visit | Attending: Gastroenterology | Admitting: Gastroenterology

## 2021-07-19 DIAGNOSIS — R110 Nausea: Secondary | ICD-10-CM | POA: Insufficient documentation

## 2021-07-19 DIAGNOSIS — R1310 Dysphagia, unspecified: Secondary | ICD-10-CM | POA: Diagnosis present

## 2021-07-19 DIAGNOSIS — F1190 Opioid use, unspecified, uncomplicated: Secondary | ICD-10-CM | POA: Insufficient documentation

## 2021-07-20 DIAGNOSIS — N1831 Chronic kidney disease, stage 3a: Secondary | ICD-10-CM

## 2021-07-20 DIAGNOSIS — I5032 Chronic diastolic (congestive) heart failure: Secondary | ICD-10-CM

## 2021-07-20 DIAGNOSIS — I2510 Atherosclerotic heart disease of native coronary artery without angina pectoris: Secondary | ICD-10-CM

## 2021-07-20 DIAGNOSIS — Z7901 Long term (current) use of anticoagulants: Secondary | ICD-10-CM

## 2021-07-20 DIAGNOSIS — Z7722 Contact with and (suspected) exposure to environmental tobacco smoke (acute) (chronic): Secondary | ICD-10-CM

## 2021-07-20 DIAGNOSIS — I130 Hypertensive heart and chronic kidney disease with heart failure and stage 1 through stage 4 chronic kidney disease, or unspecified chronic kidney disease: Secondary | ICD-10-CM

## 2021-07-20 DIAGNOSIS — N179 Acute kidney failure, unspecified: Secondary | ICD-10-CM

## 2021-07-20 DIAGNOSIS — G9341 Metabolic encephalopathy: Secondary | ICD-10-CM

## 2021-07-20 DIAGNOSIS — I480 Paroxysmal atrial fibrillation: Secondary | ICD-10-CM

## 2021-07-20 DIAGNOSIS — G2581 Restless legs syndrome: Secondary | ICD-10-CM

## 2021-07-20 DIAGNOSIS — Z79891 Long term (current) use of opiate analgesic: Secondary | ICD-10-CM

## 2021-07-20 DIAGNOSIS — J449 Chronic obstructive pulmonary disease, unspecified: Secondary | ICD-10-CM

## 2021-07-20 DIAGNOSIS — Z6841 Body Mass Index (BMI) 40.0 and over, adult: Secondary | ICD-10-CM

## 2021-07-20 DIAGNOSIS — L89892 Pressure ulcer of other site, stage 2: Secondary | ICD-10-CM

## 2021-07-20 DIAGNOSIS — M059 Rheumatoid arthritis with rheumatoid factor, unspecified: Secondary | ICD-10-CM

## 2021-07-20 DIAGNOSIS — Z9181 History of falling: Secondary | ICD-10-CM

## 2021-07-21 DIAGNOSIS — G9341 Metabolic encephalopathy: Secondary | ICD-10-CM | POA: Diagnosis not present

## 2021-07-21 DIAGNOSIS — N1831 Chronic kidney disease, stage 3a: Secondary | ICD-10-CM | POA: Diagnosis not present

## 2021-07-21 DIAGNOSIS — L89892 Pressure ulcer of other site, stage 2: Secondary | ICD-10-CM | POA: Diagnosis not present

## 2021-07-21 DIAGNOSIS — N179 Acute kidney failure, unspecified: Secondary | ICD-10-CM | POA: Diagnosis not present

## 2021-07-21 DIAGNOSIS — I5032 Chronic diastolic (congestive) heart failure: Secondary | ICD-10-CM | POA: Diagnosis not present

## 2021-07-21 DIAGNOSIS — I130 Hypertensive heart and chronic kidney disease with heart failure and stage 1 through stage 4 chronic kidney disease, or unspecified chronic kidney disease: Secondary | ICD-10-CM | POA: Diagnosis not present

## 2021-07-21 LAB — H. PYLORI ANTIGEN, STOOL: H. Pylori Stool Ag, Eia: NEGATIVE

## 2021-07-26 ENCOUNTER — Other Ambulatory Visit: Payer: Self-pay | Admitting: Internal Medicine

## 2021-07-26 DIAGNOSIS — G894 Chronic pain syndrome: Secondary | ICD-10-CM

## 2021-07-26 MED ORDER — OXYCODONE HCL 20 MG PO TABS: 1.0000 | ORAL_TABLET | Freq: Two times a day (BID) | ORAL | 0 refills | Status: DC

## 2021-07-26 NOTE — Telephone Encounter (Signed)
Name of Medication: oxycodone 20 mg Name of Pharmacy: [facility] [address] Last Fill or Written Date and Quantity: # 32 on 06-10-21 Last Office Visit and Type: 06-21-21 Next Office Visit and Type: 09-26-21 Last Controlled Substance Agreement Date: 11/04/2020 Last UDS:04/20/21

## 2021-07-26 NOTE — Telephone Encounter (Signed)
Encourage patient to contact the pharmacy for refills or they can request refills through [facility]  Did the patient contact the pharmacy: No  LAST APPOINTMENT DATE: 06/21/2021  MEDICATION:  Oxycodone HCl 20 MG TABS  Is the patient out of medication? Yes  If not, how much is left? N/A  Is this a 90 day supply: No  PHARMACY:  [id number] - [place], [address] Phone Number: [phone]  Let patient know to contact pharmacy at the end of the day to make sure medication is ready.  Please notify patient to allow 48-72 hours to process

## 2021-07-27 DIAGNOSIS — I4891 Unspecified atrial fibrillation: Secondary | ICD-10-CM

## 2021-07-27 DIAGNOSIS — I509 Heart failure, unspecified: Secondary | ICD-10-CM | POA: Diagnosis not present

## 2021-07-27 DIAGNOSIS — F17200 Nicotine dependence, unspecified, uncomplicated: Secondary | ICD-10-CM

## 2021-07-28 ENCOUNTER — Telehealth: Payer: Medicare Other

## 2021-07-28 ENCOUNTER — Ambulatory Visit (INDEPENDENT_AMBULATORY_CARE_PROVIDER_SITE_OTHER): Payer: Medicare Other

## 2021-07-28 DIAGNOSIS — R296 Repeated falls: Secondary | ICD-10-CM

## 2021-07-28 DIAGNOSIS — I480 Paroxysmal atrial fibrillation: Secondary | ICD-10-CM

## 2021-07-28 DIAGNOSIS — I5032 Chronic diastolic (congestive) heart failure: Secondary | ICD-10-CM

## 2021-07-28 DIAGNOSIS — I890 Lymphedema, not elsewhere classified: Secondary | ICD-10-CM

## 2021-07-28 NOTE — Patient Instructions (Signed)
Visit Information  Thank you for taking time to visit with me today. Please don't hesitate to contact me if I can be of assistance to you before our next scheduled telephone appointment.  Following are the goals we discussed today:  Continue to take medications as prescribed and refill timely Attend scheduled provider appointments  Continue to weight daily and record. Notify provider for weight gain of 3 lbs overnight and 5 lbs in a week.  Check  (heart) rate once a day and record Call your doctor for new, ongoing or worsening symptoms.  Monitor blood pressure at least 1-2 times per week.  Notify provider for blood pressure concerns.  Continue to follow a low salt diet Elevate legs when sitting Contact your vascular doctor if you do not hear back regarding your Lymphedema pumps Continue to follow fall prevention strategies:  Use your assistive device ( cane or walker) if advised by your doctor, Make surer walkways are clear of clutter, cords, and throw rugs,  Make sure there is good lighting throughout your home.   Our next appointment is by telephone on 09/29/21 at 2:00 pm  Please call the care guide team at [phone] if you need to cancel or reschedule your appointment.   If you are experiencing a Mental Health or [place] or need someone to talk to, please call the Suicide and Crisis Lifeline: 988 call 1-800-273-TALK (toll free, 24 hour hotline)   Patient verbalizes understanding of instructions and care plan provided today and agrees to view in [place]. Active MyChart status and patient understanding of how to access instructions and care plan via MyChart confirmed with patient.     Quinn Plowman RN,BSN,CCM RN Case Manager [place]  [phone]

## 2021-07-28 NOTE — Chronic Care Management (AMB) (Signed)
Chronic Care Management   CCM RN Visit Note  07/28/2021 Name: Tina Pacheco MRN: [id number] DOB: 1950-09-13  Subjective: Tina Pacheco is a 71 y.o. year old female who is a primary care patient of Venia Carbon, MD. The care management team was consulted for assistance with disease management and care coordination needs.    Engaged with patient by telephone for follow up visit in response to provider referral for case management and/or care coordination services.   Consent to Services:  The patient was given information about Chronic Care Management services, agreed to services, and gave verbal consent prior to initiation of services.  Please see initial visit note for detailed documentation.   Patient agreed to services and verbal consent obtained.   Assessment: Review of patient past medical history, allergies, medications, health status, including review of consultants reports, laboratory and other test data, was performed as part of comprehensive evaluation and provision of chronic care management services.   SDOH (Social Determinants of Health) assessments and interventions performed:    CCM Care Plan  Allergies  Allergen Reactions   Fluoxetine Other (See Comments)    Headache, shaking, sleep issues Headache, shaking, sleep issues   Codeine     Nausea and vomiting/only when taking too much    Outpatient Encounter Medications as of 07/28/2021  Medication Sig Note   amiodarone (PACERONE) 200 MG tablet Take 1 tablet (200 mg total) by mouth at bedtime. GIVE ONE TABLET BY AT BEDTIME    apixaban (ELIQUIS) 5 MG TABS tablet TAKE 1 TABLET(5 MG) BY MOUTH TWICE DAILY    atorvastatin (LIPITOR) 40 MG tablet TAKE 1 TABLET(40 MG) BY MOUTH DAILY    [place] Oil-Levomenthol (FDGARD) 25-20.75 MG CAPS Take as directed . Exp:12-2022    ketoconazole (NIZORAL) 2 % cream APPLY A SMALL AMOUNT TO AFFECTED AREA ONCE A DAY TO RASH ON BUTTOCKS, GROIN    leflunomide (ARAVA) 20 MG tablet Take 1  tablet (20 mg total) by mouth daily.    metolazone (ZAROXOLYN) 2.5 MG tablet TAKE 1 TABLET BY MOUTH DAILY AS NEEDED MAY GIVE 1 TABLET AS NEEDED FOR WEIGHT GAIN OF 5 LBS IN 1 WEEK    metoprolol succinate (TOPROL-XL) 50 MG 24 hr tablet TAKE 1 TABLET BY MOUTH EVERY DAY WITH OR IMMEDIATELY FOLLOWING A MEAL    montelukast (SINGULAIR) 10 MG tablet TAKE 1 TABLET BY MOUTH AT BEDTIME    nitroGLYCERIN (NITROSTAT) 0.4 MG SL tablet Place 1 tablet (0.4 mg total) under the tongue every 5 (five) minutes as needed for chest pain.    nystatin (MYCOSTATIN/NYSTOP) powder Apply 1 application topically 3 (three) times daily as needed.    ondansetron (ZOFRAN-ODT) 4 MG disintegrating tablet Take 1 tablet (4 mg total) by mouth every 8 (eight) hours as needed for nausea or vomiting.    Oxycodone HCl 20 MG TABS Take 1 tablet (20 mg total) by mouth in the morning and at bedtime.    polyethylene glycol (MIRALAX / GLYCOLAX) packet Take 17 g by mouth daily as needed for mild constipation.    Potassium Chloride ER 20 MEQ TBCR Take 20 mEq by mouth daily at 12 noon. (Patient taking differently: Take 40 mEq by mouth 2 (two) times daily.) 06/09/2021: Patient takes one per day except on days she takes metolazone and torsemide she takes two tablets.   silver sulfADIAZINE (SILVADENE) 1 % cream Apply 1 application. topically daily. Apply 1 gram to infected area once a day    spironolactone (ALDACTONE) 25 MG  tablet TAKE 1 TABLET(25 MG) BY MOUTH DAILY    tacrolimus (PROTOPIC) 0.1 % ointment Apply topically 2 (two) times daily. Apply to aa's lower legs as needed for rash    terazosin (HYTRIN) 10 MG capsule TAKE 1 CAPSULE(10 MG) BY MOUTH AT BEDTIME    torsemide (DEMADEX) 20 MG tablet Take 2 tablets (40 mg total) by mouth 2 (two) times daily.    triamcinolone ointment (KENALOG) 0.1 % Apply twice daily to affected areas of rash at legs. Avoid applying to face, groin, and axilla. Use as directed. Long-term use can cause thinning of the skin.     No facility-administered encounter medications on file as of 07/28/2021.    Patient Active Problem List   Diagnosis Date Noted   Abnormal weight gain 07/08/2021   Left lower quadrant pain 07/08/2021   Morbid obesity ([place]) 07/08/2021   Rectal bleeding 07/08/2021   Change in bowel habit 07/08/2021   Constipation 07/08/2021   Vomiting without nausea 07/08/2021   Skin rash 07/08/2021   Colon cancer screening 07/08/2021   Cellulitis of right leg 06/10/2021   Ulcers of both lower legs ([place]) 06/10/2021   Acute renal failure superimposed on stage 3a chronic kidney disease ([place]) 04/21/2021   Dehydration 04/21/2021   Hypomagnesemia 04/21/2021   Leukocytosis 04/21/2021   Recurrent falls 04/21/2021   Pressure injury of skin 67/89/3810   Acute metabolic encephalopathy 17/51/0258   Tremor 03/23/2021   Sleep disturbance 03/23/2021   Nontraumatic complete tear of right rotator cuff 11/19/2020   Status post reverse total shoulder replacement, 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   Closed dislocation of right shoulder 11/10/2020   Dislocation of shoulder joint 11/10/2020   Stage 3b chronic kidney disease ([place]) 07/07/2020   Demand ischemia ([place])    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   Acute on chronic diastolic (congestive) heart failure ([place]) 04/04/2019   Mixed hyperlipidemia 04/04/2019   Rheumatoid arthritis, unspecified ([place]) 04/04/2019   Chronic diastolic CHF (congestive heart failure) ([facility])    COPD (chronic obstructive pulmonary disease) ([place]) 03/30/2019   History of falling 03/30/2019   Hypertensive heart disease with heart failure ([place]) 03/30/2019   Morbid (severe) obesity due to excess calories ([place]) 02/28/2019   Sleep apnea, unspecified 02/28/2019   Paroxysmal atrial fibrillation ([place]) 02/28/2019   Athscl heart disease of native coronary artery w/o ang pctrs 02/28/2019   Chronic obstructive pulmonary disease ([place]) 02/28/2019   Gout, unspecified 02/28/2019   Urge incontinence of urine 12/23/2018    Left leg pain 09/18/2018   Chronic gouty arthropathy without tophi 04/01/2018   Preventative health care 03/20/2018   Mood disorder ([place]) 03/20/2018   Advance directive discussed with patient 03/20/2018   Lymphedema 11/26/2017   Nausea 05/08/2017   Narcotic dependence ([place]) 03/09/2017   Vertigo 06/28/2016   Bronchiectasis without acute exacerbation ([place]) 06/15/2015   DDD (degenerative disc disease), lumbosacral 03/30/2015   Sleep apnea    Atherosclerotic heart disease of native coronary artery with angina pectoris ([place]) 08/25/2014   Paroxysmal A-fib ([place]) 08/25/2014   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   RLS (restless legs syndrome) 07/06/2014   Gout 05/26/2014   Chronic pain syndrome 05/26/2014   Migraine 04/08/2014   Back pain 07/22/2013   Metatarsalgia of right foot 07/22/2013   Insomnia 07/15/2013   OA (osteoarthritis) 11/20/2012   DJD (degenerative joint disease) of cervical spine 09/10/2012   Allergic rhinitis 07/10/2011   Rheumatoid arthritis ([place]) 03/06/2011   Morbid obesity with BMI of 50.0-59.9, adult ([place]) 06/22/2010   HLD (  hyperlipidemia) 06/22/2010   HTN (hypertension) 06/22/2010   CAD (coronary artery disease) 06/22/2010   Long term current use of anticoagulant therapy 05/27/2010    Conditions to be addressed/monitored:Atrial Fibrillation, CHF, and Lymphedema  Care Plan : [facility] Plan of care  Updates made by Dannielle Karvonen, RN since 07/28/2021 12:00 AM     Problem: Chronic Disesease management, education and care coordination needs ( HF, A-fib, Lymphedema)   Priority: High     Long-Range Goal: Development of plan of care to address chronic disease management and care coordination needs. (HF, A-fib, Lymphedema)   Start Date: 01/10/2021  Expected End Date: 09/26/2021  Priority: High  Note:   Current Barriers:  Knowledge Deficits related to plan of care for management of Atrial Fibrillation, CHF, and Lymphedema  Chronic Disease Management  support and education needs related to Atrial Fibrillation, CHF, and Lymphedema  Patient reports having follow up with dermatologist on 07/05/21.  She reports completing her antibiotic for the venous stasis dermatitis. She states she continues to use the ointment as as recommended. Patients reports having follow up visit with her vascular doctor on 07/08/21.  She reports the velcro compression system was recommended and awaiting prior authorization for lymphedema pump for legs.  Patient states she decided not to get the velcro compression hose but will wait for the Lymphedema pump.  P[place]et reports having follow up visit with gastroenterologist on 07/12/21.  Patient states she was prescribed zofran.  She reports medication is helping her, "a little."  Patient reports having follow up visit with orthopedic doctor on 5/176/23. Patient denies changes to treatment plan. She states she will continue to work on home exercises to increase range of motion to shoulders.  Patient states is up 6 lbs in weight from yesterday. She reports implementing her heart failure action plan by taking an extra fluid pill. She denies any increase shortness of breath, swelling, or fatigue.  Patient verbalized awareness to contact her doctor if no improvement.    Per chart review patient next follow up appointment with her primary care provider is on 09/26/21.  Marland Kitchen   RNCM Clinical Goal(s):  Patient will verbalize basic understanding of Atrial Fibrillation, CHF, and Lymphedema  disease process and self health management plan as evidenced by the following  through collaboration with RN Care manager, provider, and care team:   -verbalize understanding of plan for management of Atrial Fibrillation, CHF, and Lymphedema -take all medications exactly as prescribed and will call provider for medication related questions -attend all scheduled medical appointments:  -continue to work with RN Care Manager to address care management and care  coordination needs related to  Atrial Fibrillation, CHF, and Lymphedema Interventions: 1:1 collaboration with primary care provider regarding development and update of comprehensive plan of care as evidenced by provider attestation and co-signature Inter-disciplinary care team collaboration (see longitudinal plan of care) Evaluation of current treatment plan related to  self management and patient's adherence to plan as established by provider   Lymphedema Interventions:  Goal on track:  Yes.  Long term Evaluation of current treatment plan related to Lymphedema and patient's adherence to plan as established by provider; Reviewed medications with patient and discussed  Reviewed scheduled/upcoming provider appointments  Discussed plans with patient for ongoing care management follow up and provided patient with direct contact information for care management team; Assessed for falls Patient advised to elevate legs when sitting, Advised to avoid salt and be as active as possible.  Heart Failure Interventions: Goal on track:  yes Long term Provided education on low sodium diet; Discussed importance of daily weight and advised patient to weigh and record daily; Discussed the importance of keeping all appointments with provider; Encouraged patient to decrease salt intake Patient advised to contact her doctor if no improvement in weight/ heart failure symptoms after taking extra fluid pill.   AFIB Interventions: Goal on track: Yes.  Long Term Reviewed importance of adherence to anticoagulant exactly as prescribed Assessed for atrial fibrillation symptoms.  Advised to notify doctor for mild/ moderate symptoms and call 911 for severe symptoms.  Patient Goals/Self-Care Activities: Continue to take medications as prescribed and refill timely Attend scheduled provider appointments  Continue to weight daily and record. Notify provider for weight gain of 3 lbs overnight and 5 lbs in a week.  Check   (heart) rate once a day and record Call your doctor for new, ongoing or worsening symptoms.  Monitor blood pressure at least 1-2 times per week.  Notify provider for blood pressure concerns.  Continue to follow a low salt diet Elevate legs when sitting Contact your vascular doctor if you do not hear back regarding your Lymphedema pumps Continue to follow fall prevention strategies:  Use your assistive device ( cane or walker) if advised by your doctor, Make surer walkways are clear of clutter, cords, and throw rugs,  Make sure there is good lighting throughout your home.       Plan:The patient has been provided with contact information for the care management team and has been advised to call with any health related questions or concerns.  The care management team will reach out to the patient again over the next 2 months . Quinn Plowman RN,BSN,CCM RN Case Manager [place]  [phone]

## 2021-08-03 DIAGNOSIS — M0579 Rheumatoid arthritis with rheumatoid factor of multiple sites without organ or systems involvement: Secondary | ICD-10-CM | POA: Diagnosis not present

## 2021-08-11 ENCOUNTER — Telehealth (INDEPENDENT_AMBULATORY_CARE_PROVIDER_SITE_OTHER): Payer: Self-pay | Admitting: Vascular Surgery

## 2021-08-11 NOTE — Telephone Encounter (Signed)
Patient called and stated she was supposed to get a lymp pump and it's been over a month now and she have not received it yet.  She would like for someone to call her daughter, [name] at [phone].

## 2021-08-11 NOTE — Telephone Encounter (Signed)
Called patient's daughter and a message was left letting her know the pump had been ordered and [employer] would be contacting them from that point.

## 2021-08-12 ENCOUNTER — Encounter (INDEPENDENT_AMBULATORY_CARE_PROVIDER_SITE_OTHER): Payer: Self-pay | Admitting: Vascular Surgery

## 2021-08-26 ENCOUNTER — Telehealth: Payer: Self-pay

## 2021-08-26 DIAGNOSIS — I4891 Unspecified atrial fibrillation: Secondary | ICD-10-CM | POA: Diagnosis not present

## 2021-08-26 DIAGNOSIS — I509 Heart failure, unspecified: Secondary | ICD-10-CM | POA: Diagnosis not present

## 2021-08-26 DIAGNOSIS — G894 Chronic pain syndrome: Secondary | ICD-10-CM

## 2021-08-26 NOTE — Telephone Encounter (Signed)
MEDICATION: Oxycodone HCl 20 MG TABS  PHARMACY: [id number] - [place], [place]  Comments: Patient will be out by weekend.   **Let patient know to contact pharmacy at the end of the day to make sure medication is ready. **  ** Please notify patient to allow 48-72 hours to process**  **Encourage patient to contact the pharmacy for refills or they can request refills through [facility]**

## 2021-08-26 NOTE — Telephone Encounter (Signed)
Name of Medication: oxycodone 20 mg Name of Pharmacy: [facility] [address] Last Fill or Written Date and Quantity: # 70 on 07-26-21 Last Office Visit and Type: 06-21-21 Next Office Visit and Type: 09-26-21 Last Controlled Substance Agreement Date: 11/04/2020 Last UDS:04/20/21

## 2021-08-28 ENCOUNTER — Other Ambulatory Visit: Payer: Self-pay | Admitting: Cardiovascular Disease

## 2021-08-28 DIAGNOSIS — I480 Paroxysmal atrial fibrillation: Secondary | ICD-10-CM

## 2021-08-28 MED ORDER — OXYCODONE HCL 20 MG PO TABS: 1.0000 | ORAL_TABLET | Freq: Two times a day (BID) | ORAL | 0 refills | Status: DC

## 2021-08-28 NOTE — Addendum Note (Signed)
Addended by: Viviana Simpler I on: 08/28/2021 11:06 AM   Modules accepted: Orders

## 2021-08-29 NOTE — Telephone Encounter (Signed)
Please advise if ok to refill Amiodarone 200 mg qd. Last filled by  [facility] Ordering/Authorizing: Nickola Major, NP

## 2021-08-31 DIAGNOSIS — M0579 Rheumatoid arthritis with rheumatoid factor of multiple sites without organ or systems involvement: Secondary | ICD-10-CM | POA: Diagnosis not present

## 2021-08-31 NOTE — Telephone Encounter (Signed)
Rx request sent to pharmacy.  

## 2021-09-01 ENCOUNTER — Emergency Department: Payer: Medicare Other

## 2021-09-01 ENCOUNTER — Observation Stay: Payer: Medicare Other

## 2021-09-01 ENCOUNTER — Inpatient Hospital Stay
Admission: EM | Admit: 2021-09-01 | Discharge: 2021-09-04 | DRG: 641 | Disposition: A | Discharge status: Home Health | Payer: Medicare Other | Attending: Internal Medicine | Admitting: Internal Medicine

## 2021-09-01 ENCOUNTER — Other Ambulatory Visit: Payer: Self-pay

## 2021-09-01 DIAGNOSIS — G894 Chronic pain syndrome: Secondary | ICD-10-CM | POA: Diagnosis present

## 2021-09-01 DIAGNOSIS — Z79891 Long term (current) use of opiate analgesic: Secondary | ICD-10-CM

## 2021-09-01 DIAGNOSIS — M6282 Rhabdomyolysis: Secondary | ICD-10-CM | POA: Diagnosis present

## 2021-09-01 DIAGNOSIS — I130 Hypertensive heart and chronic kidney disease with heart failure and stage 1 through stage 4 chronic kidney disease, or unspecified chronic kidney disease: Secondary | ICD-10-CM | POA: Diagnosis present

## 2021-09-01 DIAGNOSIS — Z85828 Personal history of other malignant neoplasm of skin: Secondary | ICD-10-CM

## 2021-09-01 DIAGNOSIS — I2510 Atherosclerotic heart disease of native coronary artery without angina pectoris: Secondary | ICD-10-CM | POA: Diagnosis present

## 2021-09-01 DIAGNOSIS — Z7901 Long term (current) use of anticoagulants: Secondary | ICD-10-CM

## 2021-09-01 DIAGNOSIS — Z90710 Acquired absence of both cervix and uterus: Secondary | ICD-10-CM | POA: Diagnosis not present

## 2021-09-01 DIAGNOSIS — Z7722 Contact with and (suspected) exposure to environmental tobacco smoke (acute) (chronic): Secondary | ICD-10-CM | POA: Diagnosis present

## 2021-09-01 DIAGNOSIS — Z981 Arthrodesis status: Secondary | ICD-10-CM

## 2021-09-01 DIAGNOSIS — Z961 Presence of intraocular lens: Secondary | ICD-10-CM | POA: Diagnosis present

## 2021-09-01 DIAGNOSIS — R0902 Hypoxemia: Secondary | ICD-10-CM | POA: Diagnosis not present

## 2021-09-01 DIAGNOSIS — Z6841 Body Mass Index (BMI) 40.0 and over, adult: Secondary | ICD-10-CM | POA: Diagnosis not present

## 2021-09-01 DIAGNOSIS — E785 Hyperlipidemia, unspecified: Secondary | ICD-10-CM | POA: Diagnosis present

## 2021-09-01 DIAGNOSIS — N189 Chronic kidney disease, unspecified: Secondary | ICD-10-CM | POA: Diagnosis present

## 2021-09-01 DIAGNOSIS — N179 Acute kidney failure, unspecified: Secondary | ICD-10-CM | POA: Diagnosis present

## 2021-09-01 DIAGNOSIS — M5030 Other cervical disc degeneration, unspecified cervical region: Secondary | ICD-10-CM | POA: Diagnosis not present

## 2021-09-01 DIAGNOSIS — M069 Rheumatoid arthritis, unspecified: Secondary | ICD-10-CM | POA: Diagnosis present

## 2021-09-01 DIAGNOSIS — G4730 Sleep apnea, unspecified: Secondary | ICD-10-CM | POA: Diagnosis present

## 2021-09-01 DIAGNOSIS — Z96611 Presence of right artificial shoulder joint: Secondary | ICD-10-CM | POA: Diagnosis present

## 2021-09-01 DIAGNOSIS — Z885 Allergy status to narcotic agent status: Secondary | ICD-10-CM

## 2021-09-01 DIAGNOSIS — R531 Weakness: Secondary | ICD-10-CM | POA: Diagnosis not present

## 2021-09-01 DIAGNOSIS — Z9049 Acquired absence of other specified parts of digestive tract: Secondary | ICD-10-CM

## 2021-09-01 DIAGNOSIS — Z825 Family history of asthma and other chronic lower respiratory diseases: Secondary | ICD-10-CM

## 2021-09-01 DIAGNOSIS — J439 Emphysema, unspecified: Secondary | ICD-10-CM | POA: Diagnosis not present

## 2021-09-01 DIAGNOSIS — M7989 Other specified soft tissue disorders: Secondary | ICD-10-CM | POA: Diagnosis not present

## 2021-09-01 DIAGNOSIS — E876 Hypokalemia: Secondary | ICD-10-CM | POA: Diagnosis not present

## 2021-09-01 DIAGNOSIS — I10 Essential (primary) hypertension: Secondary | ICD-10-CM | POA: Diagnosis not present

## 2021-09-01 DIAGNOSIS — R600 Localized edema: Secondary | ICD-10-CM | POA: Diagnosis not present

## 2021-09-01 DIAGNOSIS — I5032 Chronic diastolic (congestive) heart failure: Secondary | ICD-10-CM | POA: Diagnosis present

## 2021-09-01 DIAGNOSIS — N1832 Chronic kidney disease, stage 3b: Secondary | ICD-10-CM | POA: Diagnosis present

## 2021-09-01 DIAGNOSIS — L89302 Pressure ulcer of unspecified buttock, stage 2: Secondary | ICD-10-CM | POA: Diagnosis present

## 2021-09-01 DIAGNOSIS — I480 Paroxysmal atrial fibrillation: Secondary | ICD-10-CM | POA: Diagnosis present

## 2021-09-01 DIAGNOSIS — R4182 Altered mental status, unspecified: Secondary | ICD-10-CM | POA: Diagnosis not present

## 2021-09-01 DIAGNOSIS — R001 Bradycardia, unspecified: Secondary | ICD-10-CM | POA: Diagnosis present

## 2021-09-01 DIAGNOSIS — M1712 Unilateral primary osteoarthritis, left knee: Secondary | ICD-10-CM | POA: Diagnosis not present

## 2021-09-01 DIAGNOSIS — L8990 Pressure ulcer of unspecified site, unspecified stage: Secondary | ICD-10-CM | POA: Diagnosis present

## 2021-09-01 DIAGNOSIS — Z79899 Other long term (current) drug therapy: Secondary | ICD-10-CM

## 2021-09-01 DIAGNOSIS — Z8261 Family history of arthritis: Secondary | ICD-10-CM

## 2021-09-01 DIAGNOSIS — T502X5A Adverse effect of carbonic-anhydrase inhibitors, benzothiadiazides and other diuretics, initial encounter: Secondary | ICD-10-CM | POA: Diagnosis present

## 2021-09-01 DIAGNOSIS — Z888 Allergy status to other drugs, medicaments and biological substances status: Secondary | ICD-10-CM

## 2021-09-01 DIAGNOSIS — M7122 Synovial cyst of popliteal space [Baker], left knee: Secondary | ICD-10-CM | POA: Diagnosis not present

## 2021-09-01 DIAGNOSIS — J449 Chronic obstructive pulmonary disease, unspecified: Secondary | ICD-10-CM | POA: Diagnosis present

## 2021-09-01 DIAGNOSIS — M6281 Muscle weakness (generalized): Secondary | ICD-10-CM | POA: Diagnosis not present

## 2021-09-01 LAB — URINALYSIS, ROUTINE W REFLEX MICROSCOPIC
Bilirubin Urine: NEGATIVE
Glucose, UA: NEGATIVE mg/dL
Hgb urine dipstick: NEGATIVE
Ketones, ur: NEGATIVE mg/dL
Leukocytes,Ua: NEGATIVE
Nitrite: NEGATIVE
Protein, ur: NEGATIVE mg/dL
Specific Gravity, Urine: 1.01 (ref 1.005–1.030)
pH: 5 (ref 5.0–8.0)

## 2021-09-01 LAB — TROPONIN I (HIGH SENSITIVITY): Troponin I (High Sensitivity): 21 ng/L — ABNORMAL HIGH (ref <18)

## 2021-09-01 LAB — BASIC METABOLIC PANEL
Anion gap: 15 (ref 5–15)
BUN: 68 mg/dL — ABNORMAL HIGH (ref 8–23)
CO2: 29 mmol/L (ref 22–32)
Calcium: 9.5 mg/dL (ref 8.9–10.3)
Chloride: 90 mmol/L — ABNORMAL LOW (ref 98–111)
Creatinine, Ser: 2.37 mg/dL — ABNORMAL HIGH (ref 0.44–1.00)
GFR, Estimated: 21 mL/min — ABNORMAL LOW (ref >60)
Glucose, Bld: 150 mg/dL — ABNORMAL HIGH (ref 70–99)
Potassium: 2.9 mmol/L — ABNORMAL LOW (ref 3.5–5.1)
Sodium: 134 mmol/L — ABNORMAL LOW (ref 135–145)

## 2021-09-01 LAB — CBC
HCT: 33.7 % — ABNORMAL LOW (ref 36.0–46.0)
Hemoglobin: 10.9 g/dL — ABNORMAL LOW (ref 12.0–15.0)
MCH: 28.7 pg (ref 26.0–34.0)
MCHC: 32.3 g/dL (ref 30.0–36.0)
MCV: 88.7 fL (ref 80.0–100.0)
Platelets: 217 10*3/uL (ref 150–400)
RBC: 3.8 MIL/uL — ABNORMAL LOW (ref 3.87–5.11)
RDW: 16.3 % — ABNORMAL HIGH (ref 11.5–15.5)
WBC: 10.1 10*3/uL (ref 4.0–10.5)
nRBC: 0 % (ref 0.0–0.2)

## 2021-09-01 LAB — TSH: TSH: 1.111 u[IU]/mL (ref 0.350–4.500)

## 2021-09-01 LAB — MAGNESIUM: Magnesium: 1.7 mg/dL (ref 1.7–2.4)

## 2021-09-01 LAB — CK: Total CK: 306 U/L — ABNORMAL HIGH (ref 38–234)

## 2021-09-01 LAB — BRAIN NATRIURETIC PEPTIDE: B Natriuretic Peptide: 65.2 pg/mL (ref 0.0–100.0)

## 2021-09-01 MED ORDER — NITROGLYCERIN 0.4 MG SL SUBL: 0.4000 mg | SUBLINGUAL_TABLET | SUBLINGUAL | Status: DC | PRN

## 2021-09-01 MED ORDER — POTASSIUM CHLORIDE 10 MEQ/100ML IV SOLN
10.0000 meq | INTRAVENOUS | Status: AC
  Administered 2021-09-01 (×2): 10 meq via INTRAVENOUS
  Filled 2021-09-01 (×2): qty 100

## 2021-09-01 MED ORDER — APIXABAN 5 MG PO TABS
5.0000 mg | ORAL_TABLET | Freq: Two times a day (BID) | ORAL | Status: DC
  Administered 2021-09-01 – 2021-09-04 (×6): 5 mg via ORAL
  Filled 2021-09-01 (×6): qty 1

## 2021-09-01 MED ORDER — ONDANSETRON HCL 4 MG PO TABS: 4.0000 mg | ORAL_TABLET | Freq: Four times a day (QID) | ORAL | Status: DC | PRN

## 2021-09-01 MED ORDER — METOPROLOL SUCCINATE ER 50 MG PO TB24
50.0000 mg | ORAL_TABLET | Freq: Every day | ORAL | Status: DC
Start: 2021-09-01 — End: 2021-09-02

## 2021-09-01 MED ORDER — LEFLUNOMIDE 20 MG PO TABS: 20.0000 mg | ORAL_TABLET | Freq: Every day | ORAL | Status: DC

## 2021-09-01 MED ORDER — SODIUM CHLORIDE 0.9 % IV SOLN: INTRAVENOUS | Status: DC

## 2021-09-01 MED ORDER — ACETAMINOPHEN 325 MG PO TABS: 650.0000 mg | ORAL_TABLET | Freq: Four times a day (QID) | ORAL | Status: DC | PRN

## 2021-09-01 MED ORDER — PANTOPRAZOLE SODIUM 40 MG IV SOLR
40.0000 mg | Freq: Two times a day (BID) | INTRAVENOUS | Status: DC
  Administered 2021-09-01 – 2021-09-02 (×2): 40 mg via INTRAVENOUS
  Filled 2021-09-01 (×2): qty 10

## 2021-09-01 MED ORDER — ACETAMINOPHEN 650 MG RE SUPP: 650.0000 mg | Freq: Four times a day (QID) | RECTAL | Status: DC | PRN

## 2021-09-01 MED ORDER — TERAZOSIN HCL 5 MG PO CAPS
10.0000 mg | ORAL_CAPSULE | Freq: Every day | ORAL | Status: DC
  Administered 2021-09-01 – 2021-09-03 (×2): 10 mg via ORAL
  Filled 2021-09-01 (×4): qty 2

## 2021-09-01 MED ORDER — LACTATED RINGERS IV BOLUS
1000.0000 mL | Freq: Once | INTRAVENOUS | Status: AC
  Administered 2021-09-01: 1000 mL via INTRAVENOUS

## 2021-09-01 MED ORDER — SODIUM CHLORIDE 0.9% FLUSH
3.0000 mL | Freq: Two times a day (BID) | INTRAVENOUS | Status: DC
  Administered 2021-09-01 – 2021-09-04 (×5): 3 mL via INTRAVENOUS

## 2021-09-01 MED ORDER — MONTELUKAST SODIUM 10 MG PO TABS
10.0000 mg | ORAL_TABLET | Freq: Every day | ORAL | Status: DC
  Administered 2021-09-01 – 2021-09-03 (×3): 10 mg via ORAL
  Filled 2021-09-01 (×3): qty 1

## 2021-09-01 MED ORDER — ATORVASTATIN CALCIUM 20 MG PO TABS: 40.0000 mg | ORAL_TABLET | Freq: Every day | ORAL | Status: DC

## 2021-09-01 MED ORDER — ONDANSETRON HCL 4 MG/2ML IJ SOLN
4.0000 mg | Freq: Four times a day (QID) | INTRAMUSCULAR | Status: DC | PRN
  Administered 2021-09-04: 4 mg via INTRAVENOUS
  Filled 2021-09-01: qty 2

## 2021-09-01 MED ORDER — DIAZEPAM 5 MG/ML IJ SOLN
5.0000 mg | Freq: Once | INTRAMUSCULAR | Status: AC
  Administered 2021-09-01: 5 mg via INTRAVENOUS
  Filled 2021-09-01: qty 2

## 2021-09-01 MED ORDER — HYDRALAZINE HCL 20 MG/ML IJ SOLN
5.0000 mg | INTRAMUSCULAR | Status: DC | PRN
  Administered 2021-09-04: 5 mg via INTRAVENOUS
  Filled 2021-09-01: qty 1

## 2021-09-01 MED ORDER — AMIODARONE HCL 200 MG PO TABS
200.0000 mg | ORAL_TABLET | Freq: Every day | ORAL | Status: DC
Start: 2021-09-01 — End: 2021-09-02

## 2021-09-01 MED ORDER — HYDROCODONE-ACETAMINOPHEN 5-325 MG PO TABS
1.0000 | ORAL_TABLET | ORAL | Status: DC | PRN
  Administered 2021-09-01: 1 via ORAL
  Filled 2021-09-01: qty 1

## 2021-09-01 NOTE — H&P (Signed)
History and Physical    Patient: Tina Pacheco OJJ:[id number] DOB: 18-Jul-1950 DOA: 09/01/2021 DOS: the patient was seen and examined on 09/02/2021 PCP: Venia Carbon, MD  Patient coming from: Home  Chief Complaint:  Chief Complaint  Patient presents with   Weakness   HPI: Tina Pacheco is a 71 y.o. female with medical history significant of   Pt is alert and oriented and states she woke this morning and she woke up and was starting at EMS, pt she woke up at 1 pm and it was few minutes.  She had pain in her legs and arms and could not get up out of chair.  Arms and legs didn't work, they were weak and painful . She took her gabapentin and oxycodone but not the day before.    Review of Systems  Constitutional:  Positive for malaise/fatigue.  Neurological:  Positive for weakness.  All other systems reviewed and are negative.  Past Medical History:  Diagnosis Date   Anemia    Arthritis    RA   Asthma    Basal cell carcinoma    Cataract 2018   bilateral eyes; corrected with surgery   Chronic diastolic CHF (congestive heart failure) (HCC)    Claustrophobia    Collagen vascular disease (HCC)    RA   COPD (chronic obstructive pulmonary disease) (HCC)    Diastolic dysfunction    a. echo 07/2014: EF 55-60%, no RWMA, GR2DD, mild MR, LA moderately dilated, PASP 38 mm Hg   Dyslipidemia    Headache    migraines   Hemihypertrophy    History of cardiac cath    a. cardiac cath 05/24/2010 - nonobstructive CAD   History of gout    Hyperplastic colonic polyp 2003   Hypertension    Hypokalemia    Morbid obesity ([place])    PAF (paroxysmal atrial fibrillation) ([place])    a. on Pradaxa; b. CHADSVASc at least 2 (HTN & female)   Rheumatoid arthritis(714.0)    Sleep apnea    a. not compliant with CPAP   Stage 3 chronic kidney disease ([place])    Past Surgical History:  Procedure Laterality Date   ABDOMINAL HYSTERECTOMY     APPLICATION OF WOUND VAC Right 08/09/2017   Procedure:  APPLICATION OF WOUND VAC;  Surgeon: Albertine Patricia, DPM;  Location: [facility];  Service: Podiatry;  Laterality: Right;   CARDIAC CATHETERIZATION  05/24/2010   nonobstructive CAD   CATARACT EXTRACTION W/ INTRAOCULAR LENS IMPLANT Right 06/12/2016   Dr. Darleen Crocker   CATARACT EXTRACTION W/ INTRAOCULAR LENS IMPLANT Left 06/26/2016   Dr. Darleen Crocker   CESAREAN SECTION     CHOLECYSTECTOMY     COLONOSCOPY  06/12/2011   Procedure: COLONOSCOPY;  Surgeon: Juanita Craver, MD;  Location: [facility];  Service: Endoscopy;  Laterality: N/A;   COLONOSCOPY N/A 03/17/2013   Procedure: COLONOSCOPY;  Surgeon: Juanita Craver, MD;  Location: [facility];  Service: Endoscopy;  Laterality: N/A;   EYE SURGERY     FOOT ARTHRODESIS Right 07/13/2017   Procedure: ARTHRODESIS FOOT-MULTI.FUSIONS (6 JOINTS);  Surgeon: Albertine Patricia, DPM;  Location: [facility];  Service: Podiatry;  Laterality: Right;   IRRIGATION AND DEBRIDEMENT FOOT Right 08/09/2017   Procedure: IRRIGATION AND DEBRIDEMENT FOOT;  Surgeon: Albertine Patricia, DPM;  Location: [facility];  Service: Podiatry;  Laterality: Right;   KNEE ARTHROSCOPY     bilateral   LEFT HEART CATH AND CORONARY ANGIOGRAPHY N/A 06/04/2020   Procedure: LEFT HEART CATH AND CORONARY ANGIOGRAPHY;  Surgeon: Wellington Hampshire, MD;  Location: [facility];  Service: Cardiovascular;  Laterality: N/A;   REVERSE SHOULDER ARTHROPLASTY Right 11/16/2020   Procedure: REVERSE SHOULDER ARTHROPLASTY;  Surgeon: Corky Mull, MD;  Location: [facility];  Service: Orthopedics;  Laterality: Right;   SHOULDER CLOSED REDUCTION Right 11/11/2020   Procedure: CLOSED REDUCTION SHOULDER;  Surgeon: Corky Mull, MD;  Location: [facility];  Service: Orthopedics;  Laterality: Right;   SHOULDER INJECTION Right 11/11/2020   Procedure: SHOULDER INJECTION;  Surgeon: Corky Mull, MD;  Location: [facility];  Service: Orthopedics;  Laterality: Right;   SINUSOTOMY     TOOTH EXTRACTION  12/2016   VAGINAL HYSTERECTOMY      Social History:  reports that she has never smoked. She has been exposed to tobacco smoke. She has never used smokeless tobacco. She reports that she does not drink alcohol and does not use drugs.  Allergies  Allergen Reactions   Fluoxetine Other (See Comments)    Headache, shaking, sleep issues Headache, shaking, sleep issues   Codeine     Nausea and vomiting/only when taking too much    Family History  Problem Relation Age of Onset   Tuberculosis Mother    Parkinsonism Mother    Emphysema Father        smoked   Arthritis Father    Cancer Sister    Diabetes type II Sister    Breast cancer Sister    Tuberculosis Sister    Breast cancer Maternal Aunt    Colon cancer Neg Hx    Esophageal cancer Neg Hx    Pancreatic cancer Neg Hx    Stomach cancer Neg Hx     Prior to Admission medications   Medication Sig Start Date End Date Taking? Authorizing Provider  amiodarone (PACERONE) 200 MG tablet TAKE 1 TABLET(200 MG) BY MOUTH DAILY 08/31/21   Gollan, Kathlene November, MD  apixaban (ELIQUIS) 5 MG TABS tablet TAKE 1 TABLET(5 MG) BY MOUTH TWICE DAILY 12/18/20   Medina-Vargas, Monina C, NP  atorvastatin (LIPITOR) 40 MG tablet TAKE 1 TABLET(40 MG) BY MOUTH DAILY 05/23/21   Venia Carbon, MD  Caraway Oil-Levomenthol (FDGARD) 25-20.75 MG CAPS Take as directed . Exp:12-2022 07/12/21   Armbruster, Carlota Raspberry, MD  ketoconazole (NIZORAL) 2 % cream APPLY A SMALL AMOUNT TO AFFECTED AREA ONCE A DAY TO RASH ON BUTTOCKS, GROIN 12/18/20   Medina-Vargas, Monina C, NP  leflunomide (ARAVA) 20 MG tablet Take 1 tablet (20 mg total) by mouth daily. 12/18/20   Medina-Vargas, Monina C, NP  metolazone (ZAROXOLYN) 2.5 MG tablet TAKE 1 TABLET BY MOUTH DAILY AS NEEDED MAY GIVE 1 TABLET AS NEEDED FOR WEIGHT GAIN OF 5 LBS IN 1 WEEK 03/29/21   Viviana Simpler I, MD  metoprolol succinate (TOPROL-XL) 50 MG 24 hr tablet TAKE 1 TABLET BY MOUTH EVERY DAY WITH OR IMMEDIATELY FOLLOWING A MEAL 02/04/21   Viviana Simpler I, MD   montelukast (SINGULAIR) 10 MG tablet TAKE 1 TABLET BY MOUTH AT BEDTIME 02/14/21   Venia Carbon, MD  nitroGLYCERIN (NITROSTAT) 0.4 MG SL tablet Place 1 tablet (0.4 mg total) under the tongue every 5 (five) minutes as needed for chest pain. 06/05/20   Fritzi Mandes, MD  nystatin (MYCOSTATIN/NYSTOP) powder Apply 1 application topically 3 (three) times daily as needed. 12/18/20   Medina-Vargas, Monina C, NP  ondansetron (ZOFRAN-ODT) 4 MG disintegrating tablet Take 1 tablet (4 mg total) by mouth every 8 (eight) hours as needed for nausea or  vomiting. 07/12/21   Armbruster, Carlota Raspberry, MD  Oxycodone HCl 20 MG TABS Take 1 tablet (20 mg total) by mouth in the morning and at bedtime. 08/28/21   Venia Carbon, MD  polyethylene glycol (MIRALAX / Floria Raveling) packet Take 17 g by mouth daily as needed for mild constipation. 07/16/17   Henreitta Leber, MD  Potassium Chloride ER 20 MEQ TBCR Take 20 mEq by mouth daily at 12 noon. Patient taking differently: Take 40 mEq by mouth 2 (two) times daily. 12/18/20   Medina-Vargas, Monina C, NP  silver sulfADIAZINE (SILVADENE) 1 % cream Apply 1 application. topically daily. Apply 1 gram to infected area once a day 06/14/21   Viviana Simpler I, MD  spironolactone (ALDACTONE) 25 MG tablet TAKE 1 TABLET(25 MG) BY MOUTH DAILY 06/06/21   Viviana Simpler I, MD  tacrolimus (PROTOPIC) 0.1 % ointment Apply topically 2 (two) times daily. Apply to aa's lower legs as needed for rash 07/05/21   Brendolyn Patty, MD  terazosin (HYTRIN) 10 MG capsule TAKE 1 CAPSULE(10 MG) BY MOUTH AT BEDTIME 02/07/21   Venia Carbon, MD  torsemide (DEMADEX) 20 MG tablet Take 2 tablets (40 mg total) by mouth 2 (two) times daily. 06/21/21   Venia Carbon, MD  triamcinolone ointment (KENALOG) 0.1 % Apply twice daily to affected areas of rash at legs. Avoid applying to face, groin, and axilla. Use as directed. Long-term use can cause thinning of the skin. 06/22/21   Brendolyn Patty, MD    Physical Exam: Vitals:    09/01/21 1945 09/01/21 2030 09/01/21 2109 09/01/21 2157  BP:   (!) 144/56 (!) 146/61  Pulse:   (!) 50 (!) 49  Resp: '15 13 19 15  '$ Temp:   98 F (36.7 C) 97.7 F (36.5 C)  TempSrc:   Oral   SpO2:   98% 95%   Physical Exam Vitals and nursing note reviewed.  Constitutional:      General: She is not in acute distress.    Appearance: Normal appearance. She is not ill-appearing, toxic-appearing or diaphoretic.  HENT:     Head: Normocephalic and atraumatic.     Right Ear: Hearing and external ear normal.     Left Ear: Hearing and external ear normal.     Nose: Nose normal. No nasal deformity.     Mouth/Throat:     Lips: Pink.     Mouth: Mucous membranes are moist.     Tongue: No lesions.     Pharynx: Oropharynx is clear.  Eyes:     Extraocular Movements: Extraocular movements intact.     Pupils: Pupils are equal, round, and reactive to light.  Neck:     Vascular: No carotid bruit.  Cardiovascular:     Rate and Rhythm: Normal rate and regular rhythm.     Pulses: Normal pulses.     Heart sounds: Normal heart sounds.  Pulmonary:     Effort: Pulmonary effort is normal.     Breath sounds: Normal breath sounds.  Abdominal:     General: Bowel sounds are normal. There is no distension.     Palpations: Abdomen is soft. There is no mass.     Tenderness: There is no abdominal tenderness. There is no guarding.     Hernia: No hernia is present.  Musculoskeletal:     Right lower leg: No edema.     Left lower leg: No edema.  Skin:    General: Skin is warm.  Neurological:  General: No focal deficit present.     Mental Status: She is alert and oriented to person, place, and time.     Cranial Nerves: Cranial nerves 2-12 are intact.     Motor: Motor function is intact.  Psychiatric:        Attention and Perception: Attention normal.        Mood and Affect: Mood normal.        Speech: Speech normal.        Behavior: Behavior normal. Behavior is cooperative.        Cognition and  Memory: Cognition normal.     Data Reviewed: Results for orders placed or performed during the hospital encounter of 09/01/21 (from the past 24 hour(s))  Basic metabolic panel     Status: Abnormal   Collection Time: 09/01/21  1:37 PM  Result Value Ref Range   Sodium 134 (L) 135 - 145 mmol/L   Potassium 2.9 (L) 3.5 - 5.1 mmol/L   Chloride 90 (L) 98 - 111 mmol/L   CO2 29 22 - 32 mmol/L   Glucose, Bld 150 (H) 70 - 99 mg/dL   BUN 68 (H) 8 - 23 mg/dL   Creatinine, Ser 2.37 (H) 0.44 - 1.00 mg/dL   Calcium 9.5 8.9 - 10.3 mg/dL   GFR, Estimated 21 (L) >60 mL/min   Anion gap 15 5 - 15  CBC     Status: Abnormal   Collection Time: 09/01/21  1:37 PM  Result Value Ref Range   WBC 10.1 4.0 - 10.5 K/uL   RBC 3.80 (L) 3.87 - 5.11 MIL/uL   Hemoglobin 10.9 (L) 12.0 - 15.0 g/dL   HCT 33.7 (L) 36.0 - 46.0 %   MCV 88.7 80.0 - 100.0 fL   MCH 28.7 26.0 - 34.0 pg   MCHC 32.3 30.0 - 36.0 g/dL   RDW 16.3 (H) 11.5 - 15.5 %   Platelets 217 150 - 400 K/uL   nRBC 0.0 0.0 - 0.2 %  Urinalysis, Routine w reflex microscopic Urine, Clean Catch     Status: Abnormal   Collection Time: 09/01/21  1:37 PM  Result Value Ref Range   Color, Urine YELLOW (A) YELLOW   APPearance CLEAR (A) CLEAR   Specific Gravity, Urine 1.010 1.005 - 1.030   pH 5.0 5.0 - 8.0   Glucose, UA NEGATIVE NEGATIVE mg/dL   Hgb urine dipstick NEGATIVE NEGATIVE   Bilirubin Urine NEGATIVE NEGATIVE   Ketones, ur NEGATIVE NEGATIVE mg/dL   Protein, ur NEGATIVE NEGATIVE mg/dL   Nitrite NEGATIVE NEGATIVE   Leukocytes,Ua NEGATIVE NEGATIVE  Brain natriuretic peptide     Status: None   Collection Time: 09/01/21  5:11 PM  Result Value Ref Range   B Natriuretic Peptide 65.2 0.0 - 100.0 pg/mL  Troponin I (High Sensitivity)     Status: Abnormal   Collection Time: 09/01/21  5:11 PM  Result Value Ref Range   Troponin I (High Sensitivity) 21 (H) <18 ng/L  Magnesium     Status: None   Collection Time: 09/01/21  5:11 PM  Result Value Ref Range    Magnesium 1.7 1.7 - 2.4 mg/dL     Assessment and Plan: * Generalized weakness Generalized weakness and altered mental status attribute to patient's electrolyte abnormality. HPI is very clear otherwise patient unable to describe the event. MRI of the brain negative. Differentials abate in the situation anything from dysrhythmia continue TIA could have contributed to patient's transient weakness and altered mental status.  Sinus bradycardia We will continue her current amiodarone and metoprolol.   Hypokalemia Replace and follow levels.    Rhabdomyolysis Nontraumatic. MIVF.   AMS (altered mental status) MRI is negative.  We will follow and admit pt for eval of AMS. D/d include Dysrhythmia/ VTE/ CVA. We will continue with comprehensive labs.    Acute-on-chronic kidney injury [facility]) Lab Results  Component Value Date   CREATININE 2.37 (H) 09/01/2021   CREATININE 1.42 (H) 05/04/2021   CREATININE 1.20 (H) 04/23/2021  we will cont with gentle MIVF hydration. Avoid nephrotoxic meds and studies. Renally  Dose all needed meds.  Hold diuretic therapy.    COPD (chronic obstructive pulmonary disease) (HCC) Stable. PRN albuterol.   Paroxysmal A-fib (HCC) -We will continue patient on her amiodarone/ metoprolol and eliquis.  Eliquis patient's heart rate is in sinus rhythm on auscultation.       CAD (coronary artery disease) Patient is currently chest pain-free. Patient continued on Eliquis, Lipitor, metoprolol.  Nitroglycerin.  HTN (hypertension) Blood pressure (!) 146/61, pulse (!) 49, temperature 97.7 F (36.5 C), resp. rate 15, SpO2 95 %.  We will continue patient on her metoprolol and as needed hydralazine. We will keep goal blood pressures in 140s tonight. We will resume patient's Aldactone tomorrow.      Advance Care Planning:    Code Status: Full Code   Consults:  None   Family Communication:  Neely,Christina (Daughter)  [phone]  (Mobile  Severity of Illness: The appropriate patient status for this patient is OBSERVATION. Observation status is judged to be reasonable and necessary in order to provide the required intensity of service to ensure the patient's safety. The patient's presenting symptoms, physical exam findings, and initial radiographic and laboratory data in the context of their medical condition is felt to place them at decreased risk for further clinical deterioration. Furthermore, it is anticipated that the patient will be medically stable for discharge from the hospital within 2 midnights of admission.   Author: Para Skeans, MD 09/02/2021 1:41 AM  For on call review [email or web address].

## 2021-09-01 NOTE — ED Notes (Signed)
Pt 86%RA, pt placed on 2L [place], pt is alert and answering questions appropriately but has noted slurred , falls asleep easily during triage.

## 2021-09-01 NOTE — ED Provider Notes (Signed)
[facility] Provider Note    Event Date/Time   First MD Initiated Contact with Patient 09/01/21 1624     (approximate)   History   Chief Complaint Weakness   HPI  Tina Pacheco is a 71 y.o. female with past medical history of hypertension, hyperlipidemia, atrial fibrillation on Eliquis, CKD, diastolic CHF, and chronic pain syndrome who presents to the ED complaining of weakness.  Patient reports that she received an injection for her rheumatoid arthritis yesterday at her doctor's office.  After returning home, she states that she began dealing with acute on chronic pain in her left knee along with weakness in both of her legs.  She took her usual oxycodone along with a gabapentin.  Patient's husband called her daughter this morning and stated that she was more difficult to arouse than usual.  EMS was called and found patient's somnolent but arousable to voice.  She was noted to have oxygen saturations of 86% in triage and placed on 2 L nasal cannula.  She is now awake and alert, answering questions appropriately and daughter states that she seems improved.     Physical Exam   Triage Vital Signs: ED Triage Vitals [09/01/21 1328]  Enc Vitals Group     BP 124/69     Pulse Rate (!) 53     Resp 16     Temp 98.5 F (36.9 C)     Temp Source Oral     SpO2 (!) 86 %     Weight      Height      Head Circumference      Peak Flow      Pain Score      Pain Loc      Pain Edu?      Excl. in [place]?     Most recent vital signs: Vitals:   09/01/21 1614 09/01/21 1739  BP: 120/78 122/71  Pulse: (!) 59 (!) 55  Resp: 14 16  Temp: 98.5 F (36.9 C) 98.8 F (37.1 C)  SpO2: 96% 97%    Constitutional: Alert and oriented. Eyes: Conjunctivae are normal. Head: Atraumatic. Nose: No congestion/rhinnorhea. Mouth/Throat: Mucous membranes are moist.  Neck: No midline cervical spine tenderness to palpation. Cardiovascular: Normal rate, regular rhythm. Grossly normal  heart sounds.  2+ radial and DP pulses bilaterally. Respiratory: Normal respiratory effort.  No retractions. Lungs CTAB. Gastrointestinal: Soft and nontender. No distention. Musculoskeletal: Diffuse tenderness to palpation at left knee with no obvious deformity.  No lower extremity edema noted. Neurologic:  Normal speech and language. No gross focal neurologic deficits are appreciated.    ED Results / Procedures / Treatments   Labs (all labs ordered are listed, but only abnormal results are displayed) Labs Reviewed  BASIC METABOLIC PANEL - Abnormal; Notable for the following components:      Result Value   Sodium 134 (*)    Potassium 2.9 (*)    Chloride 90 (*)    Glucose, Bld 150 (*)    BUN 68 (*)    Creatinine, Ser 2.37 (*)    GFR, Estimated 21 (*)    All other components within normal limits  CBC - Abnormal; Notable for the following components:   RBC 3.80 (*)    Hemoglobin 10.9 (*)    HCT 33.7 (*)    RDW 16.3 (*)    All other components within normal limits  URINALYSIS, ROUTINE W REFLEX MICROSCOPIC - Abnormal; Notable for the following components:  Color, Urine YELLOW (*)    APPearance CLEAR (*)    All other components within normal limits  TROPONIN I (HIGH SENSITIVITY) - Abnormal; Notable for the following components:   Troponin I (High Sensitivity) 21 (*)    All other components within normal limits  BRAIN NATRIURETIC PEPTIDE  MAGNESIUM  CBG MONITORING, ED     EKG  ED ECG REPORT I, Blake Divine, the attending physician, personally viewed and interpreted this ECG.   Date: 09/01/2021  EKG Time: 13:44  Rate: 55  Rhythm: sinus bradycardia  Axis: Normal  Intervals:none  ST&T Change: None  RADIOLOGY CT head reviewed and interpreted by me with no hemorrhage or midline shift.  Chest x-ray reviewed and interpreted by me with no infiltrate, edema, or effusion.  PROCEDURES:  Critical Care performed: No  Procedures   MEDICATIONS ORDERED IN ED: Medications   lactated ringers bolus 1,000 mL (1,000 mLs Intravenous New Bag/Given 09/01/21 1739)     IMPRESSION / MDM / ASSESSMENT AND PLAN / ED COURSE  I reviewed the triage vital signs and the nursing notes.                              71 y.o. female with past medical history of hypertension, hyperlipidemia, atrial fibrillation on Eliquis, diastolic CHF, CKD, and chronic pain syndrome who presents to the ED complaining of altered mental status and increased somnolence with lower extremity weakness since yesterday.  Patient's presentation is most consistent with acute presentation with potential threat to life or bodily function.  Differential diagnosis includes, but is not limited to, stroke, TIA, UTI, pneumonia, CHF exacerbation, polypharmacy, AKI, electrolyte abnormality.  Patient nontoxic-appearing and in no acute distress, vital signs are unremarkable and she has no focal neurologic deficits on exam.  Given her altered mental status and reported weakness, we will check CT head.  Patient denies any trauma to her leg but given her somnolence last night, may not remember having fallen.  We will check x-ray of her left knee, no signs or symptoms to suggest DVT or infection and she is neurovascularly intact distally.  Labs thus far are remarkable for AKI with hypokalemia, will add on magnesium level and replete potassium.  Patient initially hypoxic and placed on 2 L nasal cannula, now currently maintaining oxygen saturations on room air.  We will hydrate with IV fluids and reassess following additional imaging.  CT head is negative for acute process, chest x-ray is also unremarkable.  It appears patient has been taking significant doses of torsemide and metolazone which may be contributing to her AKI as well.  Case discussed with hospitalist for admission.      FINAL CLINICAL IMPRESSION(S) / ED DIAGNOSES   Final diagnoses:  AKI (acute kidney injury) ([place])  Altered mental status, unspecified altered  mental status type  Polypharmacy     Rx / DC Orders   ED Discharge Orders     None        Note:  This document was prepared using Dragon voice recognition software and may include unintentional dictation errors.   Blake Divine, MD 09/01/21 1840

## 2021-09-01 NOTE — Hospital Course (Addendum)
Past medical history of HLD, HTN, CKD, HFpEF, chronic pain syndrome, arthritis, chronic anticoagulation.  Presents to the hospital with complaints of an unresponsive event at home after taking gabapentin. Reportedly the patient actually has been taking her Zaroxolyn on a daily basis with the concerned that if she will not take that she will gain weight.  She has not had another renal function checked recently.  She went to her dermatologist and had a steroid injection shot after which she started having more pain on her right knee and therefore decided to take 1 dose of gabapentin.  She takes gabapentin only occasionally and does not tolerate it well.  After this the patient was sleepy and drowsy and lethargic and was not able to wake up for almost 1 day and because of this family called EMS.

## 2021-09-01 NOTE — ED Triage Notes (Signed)
Pt comes into the ED via EMS from home with c/o generalized weakness in lower extremities, fatigue. Pt is alert on arrival. States the husband had a hard time waking her up. Pt is prescribed pain medication for chronic back pain.   HR58 146/58 95%RA, 90-92 on RA [id number]

## 2021-09-02 ENCOUNTER — Observation Stay: Admit: 2021-09-02 | Payer: Medicare Other

## 2021-09-02 ENCOUNTER — Observation Stay: Payer: Medicare Other

## 2021-09-02 ENCOUNTER — Inpatient Hospital Stay (HOSPITAL_COMMUNITY)
Admit: 2021-09-02 | Discharge: 2021-09-02 | Disposition: A | Discharge status: Home / Self Care | Payer: Medicare Other | Attending: Neurology | Admitting: Neurology

## 2021-09-02 DIAGNOSIS — N179 Acute kidney failure, unspecified: Secondary | ICD-10-CM | POA: Diagnosis present

## 2021-09-02 DIAGNOSIS — I480 Paroxysmal atrial fibrillation: Secondary | ICD-10-CM | POA: Diagnosis present

## 2021-09-02 DIAGNOSIS — Z981 Arthrodesis status: Secondary | ICD-10-CM | POA: Diagnosis not present

## 2021-09-02 DIAGNOSIS — I130 Hypertensive heart and chronic kidney disease with heart failure and stage 1 through stage 4 chronic kidney disease, or unspecified chronic kidney disease: Secondary | ICD-10-CM | POA: Diagnosis present

## 2021-09-02 DIAGNOSIS — G894 Chronic pain syndrome: Secondary | ICD-10-CM | POA: Diagnosis present

## 2021-09-02 DIAGNOSIS — N1832 Chronic kidney disease, stage 3b: Secondary | ICD-10-CM | POA: Diagnosis present

## 2021-09-02 DIAGNOSIS — E785 Hyperlipidemia, unspecified: Secondary | ICD-10-CM | POA: Diagnosis present

## 2021-09-02 DIAGNOSIS — E876 Hypokalemia: Secondary | ICD-10-CM | POA: Diagnosis present

## 2021-09-02 DIAGNOSIS — Z961 Presence of intraocular lens: Secondary | ICD-10-CM | POA: Diagnosis present

## 2021-09-02 DIAGNOSIS — Z90710 Acquired absence of both cervix and uterus: Secondary | ICD-10-CM | POA: Diagnosis not present

## 2021-09-02 DIAGNOSIS — G4730 Sleep apnea, unspecified: Secondary | ICD-10-CM | POA: Diagnosis present

## 2021-09-02 DIAGNOSIS — M7989 Other specified soft tissue disorders: Secondary | ICD-10-CM | POA: Diagnosis not present

## 2021-09-02 DIAGNOSIS — R001 Bradycardia, unspecified: Secondary | ICD-10-CM | POA: Diagnosis present

## 2021-09-02 DIAGNOSIS — R531 Weakness: Secondary | ICD-10-CM | POA: Diagnosis not present

## 2021-09-02 DIAGNOSIS — I2510 Atherosclerotic heart disease of native coronary artery without angina pectoris: Secondary | ICD-10-CM | POA: Diagnosis present

## 2021-09-02 DIAGNOSIS — I5032 Chronic diastolic (congestive) heart failure: Secondary | ICD-10-CM | POA: Diagnosis present

## 2021-09-02 DIAGNOSIS — M069 Rheumatoid arthritis, unspecified: Secondary | ICD-10-CM | POA: Diagnosis present

## 2021-09-02 DIAGNOSIS — Z7901 Long term (current) use of anticoagulants: Secondary | ICD-10-CM | POA: Diagnosis not present

## 2021-09-02 DIAGNOSIS — M6282 Rhabdomyolysis: Secondary | ICD-10-CM | POA: Diagnosis present

## 2021-09-02 DIAGNOSIS — J439 Emphysema, unspecified: Secondary | ICD-10-CM | POA: Diagnosis present

## 2021-09-02 DIAGNOSIS — L89302 Pressure ulcer of unspecified buttock, stage 2: Secondary | ICD-10-CM | POA: Diagnosis present

## 2021-09-02 DIAGNOSIS — Z96611 Presence of right artificial shoulder joint: Secondary | ICD-10-CM | POA: Diagnosis present

## 2021-09-02 DIAGNOSIS — Z85828 Personal history of other malignant neoplasm of skin: Secondary | ICD-10-CM | POA: Diagnosis not present

## 2021-09-02 DIAGNOSIS — Z9049 Acquired absence of other specified parts of digestive tract: Secondary | ICD-10-CM | POA: Diagnosis not present

## 2021-09-02 DIAGNOSIS — Z6841 Body Mass Index (BMI) 40.0 and over, adult: Secondary | ICD-10-CM | POA: Diagnosis not present

## 2021-09-02 DIAGNOSIS — R4182 Altered mental status, unspecified: Secondary | ICD-10-CM | POA: Diagnosis present

## 2021-09-02 DIAGNOSIS — M7122 Synovial cyst of popliteal space [Baker], left knee: Secondary | ICD-10-CM | POA: Diagnosis not present

## 2021-09-02 LAB — CBC
HCT: 32.4 % — ABNORMAL LOW (ref 36.0–46.0)
Hemoglobin: 10.3 g/dL — ABNORMAL LOW (ref 12.0–15.0)
MCH: 27.8 pg (ref 26.0–34.0)
MCHC: 31.8 g/dL (ref 30.0–36.0)
MCV: 87.6 fL (ref 80.0–100.0)
Platelets: 211 10*3/uL (ref 150–400)
RBC: 3.7 MIL/uL — ABNORMAL LOW (ref 3.87–5.11)
RDW: 16.5 % — ABNORMAL HIGH (ref 11.5–15.5)
WBC: 9.1 10*3/uL (ref 4.0–10.5)
nRBC: 0 % (ref 0.0–0.2)

## 2021-09-02 LAB — MAGNESIUM: Magnesium: 2 mg/dL (ref 1.7–2.4)

## 2021-09-02 LAB — HEMOGLOBIN A1C
Hgb A1c MFr Bld: 5.7 % — ABNORMAL HIGH (ref 4.8–5.6)
Mean Plasma Glucose: 116.89 mg/dL

## 2021-09-02 LAB — COMPREHENSIVE METABOLIC PANEL
ALT: 28 U/L (ref 0–44)
AST: 36 U/L (ref 15–41)
Albumin: 3.2 g/dL — ABNORMAL LOW (ref 3.5–5.0)
Alkaline Phosphatase: 111 U/L (ref 38–126)
Anion gap: 9 (ref 5–15)
BUN: 63 mg/dL — ABNORMAL HIGH (ref 8–23)
CO2: 31 mmol/L (ref 22–32)
Calcium: 9.1 mg/dL (ref 8.9–10.3)
Chloride: 96 mmol/L — ABNORMAL LOW (ref 98–111)
Creatinine, Ser: 1.98 mg/dL — ABNORMAL HIGH (ref 0.44–1.00)
GFR, Estimated: 27 mL/min — ABNORMAL LOW (ref >60)
Glucose, Bld: 129 mg/dL — ABNORMAL HIGH (ref 70–99)
Potassium: 2.8 mmol/L — ABNORMAL LOW (ref 3.5–5.1)
Sodium: 136 mmol/L (ref 135–145)
Total Bilirubin: 1.7 mg/dL — ABNORMAL HIGH (ref 0.3–1.2)
Total Protein: 6.6 g/dL (ref 6.5–8.1)

## 2021-09-02 LAB — BASIC METABOLIC PANEL
Anion gap: 11 (ref 5–15)
BUN: 57 mg/dL — ABNORMAL HIGH (ref 8–23)
CO2: 29 mmol/L (ref 22–32)
Calcium: 9.6 mg/dL (ref 8.9–10.3)
Chloride: 96 mmol/L — ABNORMAL LOW (ref 98–111)
Creatinine, Ser: 1.74 mg/dL — ABNORMAL HIGH (ref 0.44–1.00)
GFR, Estimated: 31 mL/min — ABNORMAL LOW (ref >60)
Glucose, Bld: 112 mg/dL — ABNORMAL HIGH (ref 70–99)
Potassium: 3.2 mmol/L — ABNORMAL LOW (ref 3.5–5.1)
Sodium: 136 mmol/L (ref 135–145)

## 2021-09-02 MED ORDER — LEFLUNOMIDE 20 MG PO TABS
20.0000 mg | ORAL_TABLET | Freq: Every day | ORAL | Status: DC
  Administered 2021-09-02 – 2021-09-04 (×3): 20 mg via ORAL
  Filled 2021-09-02 (×3): qty 1

## 2021-09-02 MED ORDER — ATORVASTATIN CALCIUM 20 MG PO TABS
40.0000 mg | ORAL_TABLET | Freq: Every day | ORAL | Status: DC
  Administered 2021-09-02: 40 mg via ORAL
  Filled 2021-09-02: qty 2

## 2021-09-02 MED ORDER — OXYCODONE HCL 5 MG PO TABS
5.0000 mg | ORAL_TABLET | Freq: Four times a day (QID) | ORAL | Status: DC | PRN
  Administered 2021-09-02 – 2021-09-04 (×5): 5 mg via ORAL
  Filled 2021-09-02 (×5): qty 1

## 2021-09-02 MED ORDER — PREDNISONE 10 MG PO TABS
5.0000 mg | ORAL_TABLET | Freq: Every day | ORAL | Status: DC
  Administered 2021-09-02 – 2021-09-04 (×3): 5 mg via ORAL
  Filled 2021-09-02 (×3): qty 1

## 2021-09-02 MED ORDER — METOPROLOL SUCCINATE ER 50 MG PO TB24: 50.0000 mg | ORAL_TABLET | Freq: Every day | ORAL | Status: DC

## 2021-09-02 MED ORDER — POTASSIUM CHLORIDE 10 MEQ/100ML IV SOLN
10.0000 meq | INTRAVENOUS | Status: AC
  Administered 2021-09-02 (×2): 10 meq via INTRAVENOUS
  Filled 2021-09-02 (×2): qty 100

## 2021-09-02 MED ORDER — POTASSIUM CHLORIDE CRYS ER 20 MEQ PO TBCR
40.0000 meq | EXTENDED_RELEASE_TABLET | Freq: Once | ORAL | Status: AC
  Administered 2021-09-02: 40 meq via ORAL
  Filled 2021-09-02: qty 2

## 2021-09-02 MED ORDER — ACETAMINOPHEN 650 MG RE SUPP: 650.0000 mg | Freq: Four times a day (QID) | RECTAL | Status: DC | PRN

## 2021-09-02 MED ORDER — ACETAMINOPHEN 325 MG PO TABS
650.0000 mg | ORAL_TABLET | Freq: Four times a day (QID) | ORAL | Status: DC | PRN
  Filled 2021-09-02: qty 2

## 2021-09-02 MED ORDER — LACTATED RINGERS IV SOLN
INTRAVENOUS | Status: DC
Start: 2021-09-02 — End: 2021-09-02

## 2021-09-02 MED ORDER — AMIODARONE HCL 200 MG PO TABS
200.0000 mg | ORAL_TABLET | Freq: Every day | ORAL | Status: DC
  Administered 2021-09-02 – 2021-09-03 (×2): 200 mg via ORAL
  Filled 2021-09-02 (×3): qty 1

## 2021-09-02 NOTE — Assessment & Plan Note (Signed)
Blood pressure (!) 146/61, pulse (!) 49, temperature 97.7 F (36.5 C), resp. rate 15, SpO2 95 %.  We will continue patient on her metoprolol and as needed hydralazine. We will keep goal blood pressures in 140s tonight. We will resume patient's Aldactone tomorrow.

## 2021-09-02 NOTE — Assessment & Plan Note (Signed)
Nontraumatic. MIVF.

## 2021-09-02 NOTE — Evaluation (Signed)
Occupational Therapy Evaluation Patient Details Name: Tina Pacheco MRN: [id number] DOB: 1950-03-09 Today's Date: 09/02/2021   History of Present Illness Pt is a 71 year old female presenting to the ED c/o weakness, AMS with electrolyte abnormality, PMH significant for past medical history of hypertension, hyperlipidemia, atrial fibrillation on Eliquis, CKD, diastolic CHF, and chronic pain syndrome   Clinical Impression   Chart reviewed, RN cleared pt for participation in OT evaluation. Pt greeted in room in care of PT, co tx completed. Pt daughter present throughout. PTA pt was SPT to mwc for household mobility, amb short household distances with hemi walker. Assist was required for IADLs, and toileting/bathing. Pt sleeps in an Optician, dispensing. At this time pt presents with deficits in strength, endurance, activity tolerance all affecting safe and optimal ADL completion. Recommend discharge home with with HHOT to address functional deficits and to maximize functional performance, trial additional AD for increased independent ADL completion. Pt is left in bedside chair, NAD, all needs met. OT will follow acutely.      Recommendations for follow up therapy are one component of a multi-disciplinary discharge planning process, led by the attending physician.  Recommendations may be updated based on patient status, additional functional criteria and insurance authorization.   Follow Up Recommendations  Home health OT    Assistance Recommended at Discharge Frequent or constant Supervision/Assistance  Patient can return home with the following A little help with walking and/or transfers;A little help with bathing/dressing/bathroom    Functional Status Assessment  Patient has had a recent decline in their functional status and demonstrates the ability to make significant improvements in function in a reasonable and predictable amount of time.  Equipment Recommendations  Other (comment) (pt has  recommended equipment)    Recommendations for Other Services       Precautions / Restrictions Precautions Precautions: Fall Restrictions Weight Bearing Restrictions: No      Mobility Bed Mobility Overal bed mobility: Needs Assistance Bed Mobility: Supine to Sit     Supine to sit: Supervision, HOB elevated     General bed mobility comments: vcs for use of bed rails    Transfers Overall transfer level: Needs assistance Equipment used: 2 person hand held assist Transfers: Sit to/from Stand, Bed to chair/wheelchair/BSC Sit to Stand: Min guard Stand pivot transfers: Min guard, Min assist, +2 safety/equipment                Balance Overall balance assessment: Needs assistance Sitting-balance support: Feet supported Sitting balance-Leahy Scale: Good     Standing balance support: No upper extremity supported Standing balance-Leahy Scale: Fair                             ADL either performed or assessed with clinical judgement   ADL Overall ADL's : Needs assistance/impaired Eating/Feeding: Set up;Sitting   Grooming: Sitting;Set up           Upper Body Dressing : Minimal assistance;Sitting       Toilet Transfer: Minimal assistance;Stand-pivot;BSC/3in1   Toileting- Clothing Manipulation and Hygiene: Min guard Toileting - Clothing Manipulation Details (indicate cue type and reason): after urinating on [facility]             Vision Patient Visual Report: No change from baseline       Perception     Praxis      Pertinent Vitals/Pain Pain Assessment Pain Assessment: Faces Faces Pain Scale: Hurts little more Pain Location: LUE  Pain Descriptors / Indicators: Discomfort Pain Intervention(s): Monitored during session, Limited activity within patient's tolerance     Hand Dominance Right   Extremity/Trunk Assessment Upper Extremity Assessment Upper Extremity Assessment: RUE deficits/detail RUE Deficits / Details: baseline RUE deficits with  pt able to flex shoulder approx 40 degrees; elbow and hand appear [facility]   Lower Extremity Assessment Lower Extremity Assessment: Generalized weakness       Communication Communication Communication: No difficulties   Cognition Arousal/Alertness: Awake/alert Behavior During Therapy: WFL for tasks assessed/performed Overall Cognitive Status: Within Functional Limits for tasks assessed                                 General Comments: pt is alert and oriented x4, however ?safety awareness     General Comments  BP in supine 135/61  HR53, WNL with position changes, no c/o dizziness    Exercises Other Exercises Other Exercises: edu re: importance of oob mobility, ADL participation, use of AE for toileting   Shoulder Instructions      Home Living Family/patient expects to be discharged to:: Private residence Living Arrangements: Spouse/significant other Available Help at Discharge: Family (husband, daugther comes in to house about 1x per week to assist) Type of Home: House Home Access: Ramped entrance     Home Layout: One level     Bathroom Shower/Tub: Tub/shower unit         Home Equipment: Rollator (4 wheels);BSC/3in1;Hospital bed          Prior Functioning/Environment               Mobility Comments: pt amb short household distances with hemi walker, has gait belt, most recently has been SPT to manual wheelchair for household mobility ADLs Comments: assistance for toileting after BM, LB dressing; pt able to perform UB dressing, grooming tasks with SET UP; husabnd/daugther assist with IADL        OT Problem List: Decreased strength;Decreased activity tolerance      OT Treatment/Interventions: Self-care/ADL training;Patient/family education;Therapeutic exercise;Energy conservation;DME and/or AE instruction    OT Goals(Current goals can be found in the care plan section) Acute Rehab OT Goals Patient Stated Goal: go home OT Goal Formulation:  With patient Time For Goal Achievement: 09/16/21 Potential to Achieve Goals: Good ADL Goals Pt Will Perform Grooming: with set-up;sitting Pt Will Perform Upper Body Dressing: with set-up;sitting Pt Will Perform Lower Body Dressing: with set-up;sitting/lateral leans;sit to/from stand Pt Will Transfer to Toilet: with min guard assist Pt Will Perform Toileting - Clothing Manipulation and hygiene: with min guard assist  OT Frequency: Min 2X/week    Co-evaluation PT/OT/SLP Co-Evaluation/Treatment: Yes Reason for Co-Treatment: To address functional/ADL transfers;For patient/therapist safety   OT goals addressed during session: ADL's and self-care      AM-PAC OT "6 Clicks" Daily Activity     Outcome Measure Help from another person eating meals?: None Help from another person taking care of personal grooming?: None Help from another person toileting, which includes using toliet, bedpan, or urinal?: A Little Help from another person bathing (including washing, rinsing, drying)?: A Lot Help from another person to put on and taking off regular upper body clothing?: A Little   6 Click Score: 16   End of Session Equipment Utilized During Treatment: Gait belt Nurse Communication: Mobility status  Activity Tolerance: Patient tolerated treatment well Patient left: in chair;with call bell/phone within reach;with chair alarm set;with family/visitor present  OT Visit  Diagnosis: Unsteadiness on feet (R26.81);Muscle weakness (generalized) (M62.81)                Time: 2951-8841 OT Time Calculation (min): 33 min Charges:  OT General Charges $OT Visit: 1 Visit OT Evaluation $OT Eval Moderate Complexity: 1 Mod Shanon Payor, OTD OTR/L  09/02/21, 1:52 PM

## 2021-09-02 NOTE — Progress Notes (Signed)
PT Cancellation Note  Patient Details Name: Tina Pacheco MRN: [id number] DOB: [birth year]   Cancelled Treatment:    Reason Eval/Treat Not Completed: Medical issues which prohibited therapy (Consult received and chart reviewed. Patient noted with order for LE dopplers to rule out DVT.  Will hold until results received and patient cleared for activity.)   [name] H. Owens Shark, PT, DPT, NCS 09/02/21, 9:55 AM [phone]

## 2021-09-02 NOTE — Assessment & Plan Note (Addendum)
Lab Results  Component Value Date   CREATININE 2.37 (H) 09/01/2021   CREATININE 1.42 (H) 05/04/2021   CREATININE 1.20 (H) 04/23/2021  we will cont with gentle MIVF hydration. Avoid nephrotoxic meds and studies. Renally  Dose all needed meds.  Hold diuretic therapy.

## 2021-09-02 NOTE — Consult Note (Signed)
[facility] Nurse Consult Note: Reason for Consult:Consult for Stage 2 pressure injury to buttocks. Stage 2 pressure injuries are to be treated using the standing skin care order set for Nursing. I will implement those here today.  Wound type:Pressure Pressure Injury POA: Yes Measurement:To be obtained and documented on the Nursing Flow Sheet today by Bedside RN Wound TSV:XBLT, moist Periwound:intact Dressing procedure/placement/frequency: Patient is to be turned and repositioned and time in the supine position minimized. A silicone foam bordered dressing is to be placed over the wound after soap and water cleanse and gently pat dry. This dressing can be changed daily and PRN soiling. Heels are to be floated. A pressure redistribution chair cushion is provided and this is for use when patient is OOB in the chair. Additionally, she is to be discharged with this cushion for home use.   [facility] nursing team will not follow, but will remain available to this patient, the nursing and medical teams.  Please re-consult if needed.  Thank you for inviting [place] to participate in this patient's Plan of Care.  Maudie Flakes, MSN, RN, CNS, [place], Serita Grammes, [employer], [employer] phone:  [phone]

## 2021-09-02 NOTE — Assessment & Plan Note (Signed)
Patient is currently chest pain-free. Patient continued on Eliquis, Lipitor, metoprolol.  Nitroglycerin.

## 2021-09-02 NOTE — Assessment & Plan Note (Signed)
Generalized weakness and altered mental status attribute to patient's electrolyte abnormality. HPI is very clear otherwise patient unable to describe the event. MRI of the brain negative. Differentials abate in the situation anything from dysrhythmia continue TIA could have contributed to patient's transient weakness and altered mental status.

## 2021-09-02 NOTE — Assessment & Plan Note (Signed)
-  We will continue patient on her amiodarone/ metoprolol and eliquis.  Eliquis patient's heart rate is in sinus rhythm on auscultation.

## 2021-09-02 NOTE — Assessment & Plan Note (Signed)
Stable. PRN albuterol.

## 2021-09-02 NOTE — Progress Notes (Signed)
Progress Note Patient: Tina Pacheco DOB: [birth year] DOA: 09/01/2021  DOS: the patient was seen and examined on 09/02/2021  Brief hospital course: Past medical history of HLD, HTN, CKD, HFpEF, chronic pain syndrome, arthritis, chronic anticoagulation.  Presents to the hospital with complaints of an unresponsive event at home after taking gabapentin. Reportedly the patient actually has been taking her Zaroxolyn on a daily basis with the concerned that if she will not take that she will gain weight.  She has not had another renal function checked recently.  She went to her dermatologist and had a steroid injection shot after which she started having more pain on her right knee and therefore decided to take 1 dose of gabapentin.  She takes gabapentin only occasionally and does not tolerate it well.  After this the patient was sleepy and drowsy and lethargic and was not able to wake up for almost 1 day and because of this family called EMS.  Assessment and Plan: Generalized weakness and deconditioning. Most likely secondary to severe hypokalemia. Potassium level 2.8. We will replace aggressively with IV therapy. We will also recheck potassium level and treat accordingly. Treat magnesium level as well.  Acute kidney injury on chronic kidney disease stage IIIb. Baseline serum creatinine appears to be around 1.2.  On presentation serum creatinine was 2.37.  Currently trending down to 1.7. Monitor for now. Patient was receiving IV fluid.  I will currently hold it. Suspect AKI is secondary to overdiuresis.  TIA ruled out.  CVA ruled out.  Monitor.  Mild elevation of CK. Not available for rhabdomyolysis. Patient was treated with IV fluids.  We will recheck CK level.  COPD. Does not appear to have any exacerbation. Continue inhalers.  Paroxysmal A-fib. Currently not in RVR. Amiodarone metoprolol and Eliquis at home.  Currently on [place] continuation of. Monitor.  CAD. Does not  appear to have any acute chest pain. Now not on any antiplatelet medication.  Monitor.  HTN. Blood pressure stable. Monitor.  Morbid obesity. Arthritis. Body mass index is 48.8 kg/m.  Placing the patient at high risk of poor outcome.  Subjective: No nausea no vomiting no fever no chills.  No chest pain abnormally.  Mentation improving and close to baseline per daughter.  Physical Exam: Vitals:   09/02/21 0444 09/02/21 0836 09/02/21 1200 09/02/21 1551  BP:  (!) 144/52  (!) 151/52  Pulse:  (!) 49  (!) 56  Resp:  15  18  Temp:  (!) 97.4 F (36.3 C)  98.2 F (36.8 C)  TempSrc:      SpO2:  96%  95%  Weight: 126.4 kg  125 kg    General: Appear in mild distress; no visible Abnormal Neck Mass Or lumps, Conjunctiva normal Cardiovascular: S1 and S2 Present, no Murmur, Respiratory: good respiratory effort, Bilateral Air entry present and CTA, no Crackles, no wheezes Abdomen: Bowel Sound present, Non tender  Extremities: no Pedal edema Neurology: alert and oriented to time, place, and person  Gait not checked due to patient safety concerns   Data Reviewed: I have Reviewed nursing notes, Vitals, and Lab results since pt's last encounter. Pertinent lab results CBC and BMP I have ordered test including CBC and BMP    Family Communication: Daughter at bedside  Disposition: Status is: Inpatient Remains inpatient appropriate because: Need for close observation for renal function while being off of IV fluid and also aggressive replacement for potassium and magnesium level. Author: Berle Mull, MD 09/02/2021 7:16 PM  Please  look on [email or web address] to find out who is on call.

## 2021-09-02 NOTE — Progress Notes (Signed)
EEG complete - results pending 

## 2021-09-02 NOTE — Evaluation (Signed)
Physical Therapy Evaluation Patient Details Name: SHAKEDRA BEAM MRN: [id number] DOB: 05-15-1950 Today's Date: 09/02/2021  History of Present Illness  Pt is a 71 year old female presenting to the ED c/o weakness, AMS with electrolyte abnormality, PMH significant for past medical history of hypertension, hyperlipidemia, atrial fibrillation on Eliquis, CKD, diastolic CHF, and chronic pain syndrome  Clinical Impression  Patient resting in bed upon arrival to room; alert and oriented, follows commands and agreeable to participation with session.  Daughter at bedside, pleasant and encouraging to patient throughout session. Patient notes marked improvement in generalized weakness and pain since admission; does endorse mild residual pain to bilat knees with movement.  Baseline deficits to R shoulder elevation, all planes (previous reverse TSA); otherwise, strength and ROM grossly symmetrical and WFL for basic transfers and gait.  Able to complete bed mobility with close sup (increased time/effort); sit/stand, basic transfers (bed/chair, bed/BSC) without assist device, cga/min assist.  Does require heavy use of momentum for forward weight shift and lift off, and definite need for UE support with standing balance tasks.  Patient feels performance is near baseline for her; will continue to optimize throughout remaining hospitalization. Vitals stable and WFL with all movement, position change; no subjective/objective symptoms reported. Would benefit from skilled PT to address above deficits and promote optimal return to PLOF.; Recommend transition to HHPT upon discharge from acute hospitalization.        Recommendations for follow up therapy are one component of a multi-disciplinary discharge planning process, led by the attending physician.  Recommendations may be updated based on patient status, additional functional criteria and insurance authorization.  Follow Up Recommendations Home health PT       Assistance Recommended at Discharge Intermittent Supervision/Assistance  Patient can return home with the following  A little help with walking and/or transfers;A little help with bathing/dressing/bathroom    Equipment Recommendations    Recommendations for Other Services       Functional Status Assessment Patient has had a recent decline in their functional status and demonstrates the ability to make significant improvements in function in a reasonable and predictable amount of time.     Precautions / Restrictions Precautions Precautions: Fall Restrictions Weight Bearing Restrictions: No      Mobility  Bed Mobility Overal bed mobility: Needs Assistance Bed Mobility: Supine to Sit     Supine to sit: Supervision, HOB elevated     General bed mobility comments: increased time/effort required    Transfers Overall transfer level: Needs assistance   Transfers: Sit to/from Stand, Bed to chair/wheelchair/BSC Sit to Stand: Min guard Stand pivot transfers: Min guard         General transfer comment: heavy use of momentum for forward weight shift/anteiror weight translation with lift off; definite need of UE support to lift off and stabilize once upright    Ambulation/Gait               General Gait Details: deferred this date  Stairs            Wheelchair Mobility    Modified Rankin (Stroke Patients Only)       Balance Overall balance assessment: Needs assistance Sitting-balance support: No upper extremity supported, Feet supported Sitting balance-Leahy Scale: Good     Standing balance support: Bilateral upper extremity supported, No upper extremity supported Standing balance-Leahy Scale: Fair  Pertinent Vitals/Pain Pain Assessment Pain Assessment: Faces Faces Pain Scale: Hurts little more Pain Location: LUE Pain Descriptors / Indicators: Discomfort Pain Intervention(s): Limited activity within  patient's tolerance, Monitored during session, Repositioned    Home Living Family/patient expects to be discharged to:: Private residence Living Arrangements: Spouse/significant other Available Help at Discharge: Family Type of Home: House Home Access: Ramped entrance       Home Layout: One level Home Equipment: Rollator (4 wheels);BSC/3in1;Hospital bed Additional Comments: uses rollator at baseline, is able to ambulate ~73f. does not use AD in bathroom as it will not fit. Pt holds onto sink counter, grab bars and tub for stability.    Prior Function Prior Level of Function : History of Falls (last six months);Needs assist             Mobility Comments: pt amb short household distances with hemi walker, has gait belt, most recently has been SPT to manual wheelchair for household mobility ADLs Comments: assistance for toileting after BM, LB dressing; pt able to perform UB dressing, grooming tasks with SET UP; husabnd/daugther assist with IADL     Hand Dominance   Dominant Hand: Right    Extremity/Trunk Assessment   Upper Extremity Assessment Upper Extremity Assessment: RUE deficits/detail RUE Deficits / Details: baseline RUE deficits with pt able to flex shoulder approx 40 degrees; elbow and hand appear [facility]   Lower Extremity Assessment Lower Extremity Assessment: Generalized weakness (grossly 4-/5 throughout; no focal weakness appreciated)       Communication   Communication: No difficulties  Cognition Arousal/Alertness: Awake/alert Behavior During Therapy: WFL for tasks assessed/performed Overall Cognitive Status: Within Functional Limits for tasks assessed                                 General Comments: pt is alert and oriented x4, however ?safety awareness        General Comments General comments (skin integrity, edema, etc.): BP in supine 135/61  HR53, WNL with position changes, no c/o dizziness    Exercises Other Exercises Other  Exercises: Toilet transfer, [facility] cga/min assist; sit/[facility] device, cga/min assist Other Exercises: Bed/recliner, SPT wihtout assist device, cga/close sup; does require UE support at all times to safely complete   Assessment/Plan    PT Assessment Patient needs continued PT services  PT Problem List Decreased strength;Decreased range of motion;Decreased activity tolerance;Decreased balance;Decreased mobility;Decreased knowledge of use of DME;Decreased knowledge of precautions;Obesity       PT Treatment Interventions DME instruction;Gait training;Functional mobility training;Therapeutic activities;Therapeutic exercise;Balance training;Patient/family education    PT Goals (Current goals can be found in the Care Plan section)  Acute Rehab PT Goals Patient Stated Goal: to return home PT Goal Formulation: With patient/family Time For Goal Achievement: 09/16/21 Potential to Achieve Goals: Good    Frequency Min 2X/week     Co-evaluation PT/OT/SLP Co-Evaluation/Treatment: Yes Reason for Co-Treatment: Complexity of the patient's impairments (multi-system involvement);For patient/therapist safety;To address functional/ADL transfers PT goals addressed during session: Mobility/safety with mobility OT goals addressed during session: ADL's and self-care       AM-PAC PT "6 Clicks" Mobility  Outcome Measure Help needed turning from your back to your side while in a flat bed without using bedrails?: None Help needed moving from lying on your back to sitting on the side of a flat bed without using bedrails?: A Little Help needed moving to and from a bed  to a chair (including a wheelchair)?: A Little Help needed standing up from a chair using your arms (e.g., wheelchair or bedside chair)?: A Little Help needed to walk in hospital room?: A Lot Help needed climbing 3-5 steps with a railing? : A Lot 6 Click Score: 17    End of Session Equipment Utilized During  Treatment: Gait belt Activity Tolerance: Patient tolerated treatment well Patient left: in chair;with call bell/phone within reach;with chair alarm set;with family/visitor present Nurse Communication: Mobility status PT Visit Diagnosis: Muscle weakness (generalized) (M62.81);Difficulty in walking, not elsewhere classified (R26.2)    Time: 8341-9622 PT Time Calculation (min) (ACUTE ONLY): 47 min   Charges:   PT Evaluation $PT Eval Moderate Complexity: 1 Mod          Orman Matsumura H. Owens Shark, PT, DPT, NCS 09/02/21, 4:16 PM [phone]

## 2021-09-02 NOTE — Assessment & Plan Note (Signed)
We will continue her current amiodarone and metoprolol.

## 2021-09-02 NOTE — Assessment & Plan Note (Signed)
Replete and recheck 

## 2021-09-02 NOTE — Assessment & Plan Note (Signed)
MRI is negative.  We will follow and admit pt for eval of AMS. D/d include Dysrhythmia/ VTE/ CVA. We will continue with comprehensive labs.

## 2021-09-03 DIAGNOSIS — R4182 Altered mental status, unspecified: Secondary | ICD-10-CM

## 2021-09-03 DIAGNOSIS — R531 Weakness: Secondary | ICD-10-CM | POA: Diagnosis not present

## 2021-09-03 LAB — CBC
HCT: 31 % — ABNORMAL LOW (ref 36.0–46.0)
Hemoglobin: 10 g/dL — ABNORMAL LOW (ref 12.0–15.0)
MCH: 28.3 pg (ref 26.0–34.0)
MCHC: 32.3 g/dL (ref 30.0–36.0)
MCV: 87.8 fL (ref 80.0–100.0)
Platelets: 215 10*3/uL (ref 150–400)
RBC: 3.53 MIL/uL — ABNORMAL LOW (ref 3.87–5.11)
RDW: 16.8 % — ABNORMAL HIGH (ref 11.5–15.5)
WBC: 6.1 10*3/uL (ref 4.0–10.5)
nRBC: 0 % (ref 0.0–0.2)

## 2021-09-03 LAB — COMPREHENSIVE METABOLIC PANEL
ALT: 24 U/L (ref 0–44)
AST: 32 U/L (ref 15–41)
Albumin: 2.9 g/dL — ABNORMAL LOW (ref 3.5–5.0)
Alkaline Phosphatase: 97 U/L (ref 38–126)
Anion gap: 8 (ref 5–15)
BUN: 44 mg/dL — ABNORMAL HIGH (ref 8–23)
CO2: 28 mmol/L (ref 22–32)
Calcium: 9.8 mg/dL (ref 8.9–10.3)
Chloride: 101 mmol/L (ref 98–111)
Creatinine, Ser: 1.42 mg/dL — ABNORMAL HIGH (ref 0.44–1.00)
GFR, Estimated: 40 mL/min — ABNORMAL LOW (ref >60)
Glucose, Bld: 96 mg/dL (ref 70–99)
Potassium: 3.7 mmol/L (ref 3.5–5.1)
Sodium: 137 mmol/L (ref 135–145)
Total Bilirubin: 1 mg/dL (ref 0.3–1.2)
Total Protein: 6.7 g/dL (ref 6.5–8.1)

## 2021-09-03 LAB — MAGNESIUM: Magnesium: 2.1 mg/dL (ref 1.7–2.4)

## 2021-09-03 MED ORDER — TORSEMIDE 20 MG PO TABS
40.0000 mg | ORAL_TABLET | Freq: Every day | ORAL | Status: DC
  Administered 2021-09-03 – 2021-09-04 (×2): 40 mg via ORAL
  Filled 2021-09-03 (×2): qty 2

## 2021-09-03 MED ORDER — SPIRONOLACTONE 25 MG PO TABS
25.0000 mg | ORAL_TABLET | Freq: Every day | ORAL | Status: DC
  Administered 2021-09-03 – 2021-09-04 (×2): 25 mg via ORAL
  Filled 2021-09-03 (×2): qty 1

## 2021-09-03 MED ORDER — POTASSIUM CHLORIDE CRYS ER 20 MEQ PO TBCR
40.0000 meq | EXTENDED_RELEASE_TABLET | Freq: Two times a day (BID) | ORAL | Status: DC
  Administered 2021-09-03 (×2): 40 meq via ORAL
  Filled 2021-09-03 (×3): qty 2

## 2021-09-03 MED ORDER — ZOLPIDEM TARTRATE 5 MG PO TABS
5.0000 mg | ORAL_TABLET | Freq: Every evening | ORAL | Status: DC | PRN
Start: 2021-09-03 — End: 2021-09-04
  Administered 2021-09-03: 5 mg via ORAL
  Filled 2021-09-03: qty 1

## 2021-09-03 MED ORDER — POLYETHYLENE GLYCOL 3350 17 G PO PACK: 17.0000 g | PACK | Freq: Every day | ORAL | Status: DC | PRN

## 2021-09-03 NOTE — TOC Progression Note (Addendum)
Transition of Care [facility]) - Progression Note    Patient Details  Name: CLAUDE SWENDSEN MRN: [id number] Date of Birth: 1950/06/04  Transition of Care [facility]) CM/SW Contact  Izola Price, RN Phone Number: 09/03/2021, 10:59 AM  Clinical Narrative: 7/8: Spoke with patient regarding [place] recommendations and WOC consult notes in relation to discharge planning. Declines HH PT/PT need but is agreeable to RN for wound care. [facility] accepted and patient agreeable. Requested [facility] RN order from provider. Patient states she has all DME needs. Wound care provided seat cushion should go home with patient per [facility] consult note. Unit RN updated. Simmie Davies RN CM          Expected Discharge Plan and Services                                                 Social Determinants of Health (SDOH) Interventions    Readmission Risk Interventions    04/22/2021    4:08 PM 11/14/2020    3:54 PM 06/01/2020   12:44 PM  Readmission Risk Prevention Plan  Transportation Screening Complete Complete Complete  PCP or Specialist Appt within 3-5 Days   Complete  HRI or [place]   Complete  Social Work Consult for [place] Planning/Counseling   Complete  Palliative Care Screening   Not Applicable  Medication Review Press photographer) Complete Complete   PCP or Specialist appointment within 3-5 days of discharge Complete Complete   HRI or Home Care Consult Complete    SW Recovery Care/Counseling Consult Not Complete Complete   SW Consult Not Complete Comments RNCM assigned to patient    Palliative Care Screening Not Applicable Not Applicable   [name] Not Applicable Complete

## 2021-09-03 NOTE — Progress Notes (Signed)
Progress Note Patient: Tina Pacheco BJS:[id number] DOB: [birth year] DOA: 09/01/2021  DOS: the patient was seen and examined on 09/03/2021  Brief hospital course: Past medical history of HLD, HTN, CKD, HFpEF, chronic pain syndrome, arthritis, chronic anticoagulation.  Presents to the hospital with complaints of an unresponsive event at home after taking gabapentin. Reportedly the patient actually has been taking her Zaroxolyn on a daily basis with the concerned that if she will not take that she will gain weight.  She has not had another renal function checked recently.  She went to her dermatologist and had a steroid injection shot after which she started having more pain on her right knee and therefore decided to take 1 dose of gabapentin.  She takes gabapentin only occasionally and does not tolerate it well.  After this the patient was sleepy and drowsy and lethargic and was not able to wake up for almost 1 day and because of this family called EMS.  Assessment and Plan: Generalized weakness and deconditioning. Most likely secondary to severe hypokalemia. Potassium level is 2.5 despite aggressive therapy.  Therefore patient was given IV potassium. Currently potassium level is 3.7. Magnesium level is 2.1. Will initiate diuretic therapy and also initiate scheduled potassium and Aldactone and monitor.  Acute kidney injury on chronic kidney disease stage IIIb. Baseline serum creatinine appears to be around 1.2.  On presentation serum creatinine was 2.37.  Currently trending down to 1.7. Monitor for now. Patient was receiving IV fluid.  Which was discontinued. AKI is most likely secondary to overdiuresis with the use of Zaroxolyn. I will resume torsemide at a lower dose and monitor renal function.  TIA ruled out.  CVA ruled out.  Monitor.  Mild elevation of CK. Not available for rhabdomyolysis. Patient was treated with IV fluids.  Currently on hold.  COPD. Does not appear to have any  exacerbation. Continue inhalers.  Paroxysmal A-fib. Currently not in RVR. Amiodarone metoprolol and Eliquis at home.  Currently on [place] continuation of. Monitor.  CAD. Does not appear to have any acute chest pain. Now not on any antiplatelet medication.  Monitor.  HTN. Blood pressure stable. Monitor.  Morbid obesity. Arthritis. Body mass index is 48.82 kg/m.  Placing the patient at high risk of poor outcome.  Subjective: Denies any acute complaint.  No nausea no vomiting.  No chest pain abdominal pain.  Oral intake adequate.  No diarrhea.  Physical Exam: Vitals:   09/03/21 0320 09/03/21 0500 09/03/21 0813 09/03/21 1604  BP: 136/63  (!) 160/64 (!) 160/65  Pulse: (!) 57  (!) 57 61  Resp: '18  18 18  '$ Temp: 97.8 F (36.6 C)  97.6 F (36.4 C) 97.7 F (36.5 C)  TempSrc:      SpO2: 96%  96% 98%  Weight:  125 kg    Height:      General: Appear in mild distress, no Rash; Oral Mucosa Clear, moist. no Abnormal Neck Mass Or lumps, Conjunctiva normal  Cardiovascular: S1 and S2 Present, no Murmur, Respiratory: good respiratory effort, Bilateral Air entry present and faint basal crackles, no wheezes Abdomen: Bowel Sound present, Soft and no tenderness Extremities: bilateral  Pedal edema Neurology: alert and oriented to time, place, and person affect appropriate. no new focal deficit Gait not checked due to patient safety concerns   Data Reviewed: I have Reviewed nursing notes, Vitals, and Lab results since pt's last encounter. Pertinent lab results CBC and BMP and magnesium I have ordered test including CBC and BMP and  magnesium     Family Communication: Husband at bedside  Disposition: Status is: Inpatient Remains inpatient appropriate because: initiate diuresis and monitor renal function as well as electrolytes.  Author: Berle Mull, MD 09/03/2021 6:57 PM  Please look on [email or web address] to find out who is on call.

## 2021-09-03 NOTE — Procedures (Signed)
Routine EEG Report  Tina Pacheco is a 71 y.o. female with a history of encephalopathy who is undergoing an EEG to evaluate for seizures.  Report: This EEG was acquired with electrodes placed according to the International 10-20 electrode system (including Fp1, Fp2, F3, F4, C3, C4, P3, P4, O1, O2, T3, T4, T5, T6, A1, A2, Fz, Cz, Pz). The following electrodes were missing or displaced: none.  The occipital dominant rhythm was 7-8 Hz. This activity is reactive to stimulation. Drowsiness was manifested by background fragmentation; deeper stages of sleep were not identified. There was no focal slowing. There were no interictal epileptiform discharges. There were no electrographic seizures identified. Photic stimulation and hyperventilation were not performed.  Impression and clinical correlation: This EEG was obtained while awake and drowsy and is abnormal due to mild diffuse slowing indicative of global cerebral dysfunction. Epileptiform abnormalities were not seen during this recording.  Su Monks, MD [facility] [phone]  If 7pm- 7am, please page neurology on call as listed in [place].

## 2021-09-04 ENCOUNTER — Other Ambulatory Visit: Payer: Self-pay | Admitting: Internal Medicine

## 2021-09-04 DIAGNOSIS — R531 Weakness: Secondary | ICD-10-CM | POA: Diagnosis not present

## 2021-09-04 DIAGNOSIS — N179 Acute kidney failure, unspecified: Secondary | ICD-10-CM

## 2021-09-04 LAB — COMPREHENSIVE METABOLIC PANEL
ALT: 23 U/L (ref 0–44)
AST: 30 U/L (ref 15–41)
Albumin: 2.9 g/dL — ABNORMAL LOW (ref 3.5–5.0)
Alkaline Phosphatase: 90 U/L (ref 38–126)
Anion gap: 9 (ref 5–15)
BUN: 37 mg/dL — ABNORMAL HIGH (ref 8–23)
CO2: 27 mmol/L (ref 22–32)
Calcium: 9.9 mg/dL (ref 8.9–10.3)
Chloride: 102 mmol/L (ref 98–111)
Creatinine, Ser: 1.48 mg/dL — ABNORMAL HIGH (ref 0.44–1.00)
GFR, Estimated: 38 mL/min — ABNORMAL LOW (ref >60)
Glucose, Bld: 87 mg/dL (ref 70–99)
Potassium: 4.3 mmol/L (ref 3.5–5.1)
Sodium: 138 mmol/L (ref 135–145)
Total Bilirubin: 1.2 mg/dL (ref 0.3–1.2)
Total Protein: 6.5 g/dL (ref 6.5–8.1)

## 2021-09-04 LAB — CBC
HCT: 33.7 % — ABNORMAL LOW (ref 36.0–46.0)
Hemoglobin: 10.7 g/dL — ABNORMAL LOW (ref 12.0–15.0)
MCH: 28.2 pg (ref 26.0–34.0)
MCHC: 31.8 g/dL (ref 30.0–36.0)
MCV: 88.9 fL (ref 80.0–100.0)
Platelets: 235 10*3/uL (ref 150–400)
RBC: 3.79 MIL/uL — ABNORMAL LOW (ref 3.87–5.11)
RDW: 17 % — ABNORMAL HIGH (ref 11.5–15.5)
WBC: 7.4 10*3/uL (ref 4.0–10.5)
nRBC: 0 % (ref 0.0–0.2)

## 2021-09-04 LAB — MAGNESIUM: Magnesium: 1.9 mg/dL (ref 1.7–2.4)

## 2021-09-04 MED ORDER — METOLAZONE 2.5 MG PO TABS: ORAL_TABLET | ORAL | 0 refills | Status: DC

## 2021-09-04 MED ORDER — ONDANSETRON 4 MG PO TBDP: 4.0000 mg | ORAL_TABLET | Freq: Three times a day (TID) | ORAL | 1 refills | Status: DC | PRN

## 2021-09-04 MED ORDER — TORSEMIDE 20 MG PO TABS: ORAL_TABLET | ORAL | 0 refills | Status: DC

## 2021-09-04 MED ORDER — DOCUSATE SODIUM 100 MG PO CAPS: 100.0000 mg | ORAL_CAPSULE | Freq: Two times a day (BID) | ORAL | 2 refills | Status: AC

## 2021-09-04 NOTE — TOC Transition Note (Signed)
Transition of Care [facility]) - CM/SW Discharge Note   Patient Details  Name: Tina Pacheco MRN: [id number] Date of Birth: [birth year]  Transition of Care [facility]) CM/SW Contact:  Izola Price, RN Phone Number: 09/04/2021, 2:23 PM   Clinical Narrative:  7/9: Patient discharged today instead of 7/8. Notified [facility] of discharge for [facility] RN set up 7/8. Has needed DME at home. Simmie Davies RN CM      Final next level of care: [place] Barriers to Discharge: Barriers Resolved   Patient Goals and CMS Choice        Discharge Placement                       Discharge Plan and Services                DME Arranged: N/A (Patient has needed DME) DME Agency: NA       HH Arranged: RN [place] Agency: [place] ([place]) Date [place]: 09/02/21 Time [place]: 1050 Representative spoke with at [place]: [place] (SDOH) Interventions     Readmission Risk Interventions    04/22/2021    4:08 PM 11/14/2020    3:54 PM 06/01/2020   12:44 PM  Readmission Risk Prevention Plan  Transportation Screening Complete Complete Complete  PCP or Specialist Appt within 3-5 Days   Complete  HRI or [place]   Complete  Social Work Consult for [place] Planning/Counseling   Complete  Palliative Care Screening   Not Applicable  Medication Review Press photographer) Complete Complete   PCP or Specialist appointment within 3-5 days of discharge Complete Complete   HRI or [place] Complete    SW Recovery Care/Counseling Consult Not Complete Complete   SW Consult Not Complete Comments RNCM assigned to patient    Palliative Care Screening Not Applicable Not Applicable   [place] Not Applicable Complete

## 2021-09-04 NOTE — Plan of Care (Signed)
VSS, IV removed, discharge instructions reviewed and patient discharged to home

## 2021-09-04 NOTE — TOC Transition Note (Addendum)
Transition of Care [facility]) - CM/SW Discharge Note   Patient Details  Name: Tina Pacheco MRN: [id number] Date of Birth: 1950/10/22  Transition of Care [facility]) CM/SW Contact:  Izola Price, RN Phone Number: 09/04/2021, 3:58 PM   Clinical Narrative: Requested [place] orders were not placed prior to discharge. Will attempt to contact provider. Simmie Davies RN CM  UPDATE: [place] orders placed post discharge. [facility] notified. Simmie Davies RN CM 417 pm.    Final next level of care: [place] Barriers to Discharge: Barriers Resolved   Patient Goals and CMS Choice        Discharge Placement                       Discharge Plan and Services                DME Arranged: N/A (Patient has needed DME) DME Agency: NA       HH Arranged: RN [place] Agency: [place] ([place]) Date [place]: 09/02/21 Time [place]: 1050 Representative spoke with at [place]: [place] (SDOH) Interventions     Readmission Risk Interventions    04/22/2021    4:08 PM 11/14/2020    3:54 PM 06/01/2020   12:44 PM  Readmission Risk Prevention Plan  Transportation Screening Complete Complete Complete  PCP or Specialist Appt within 3-5 Days   Complete  HRI or [place]   Complete  Social Work Consult for [place] Planning/Counseling   Complete  Palliative Care Screening   Not Applicable  Medication Review Press photographer) Complete Complete   PCP or Specialist appointment within 3-5 days of discharge Complete Complete   HRI or [place] Complete    SW Recovery Care/Counseling Consult Not Complete Complete   SW Consult Not Complete Comments RNCM assigned to patient    Palliative Care Screening Not Applicable Not Applicable   [place] Not Applicable Complete

## 2021-09-05 ENCOUNTER — Telehealth: Payer: Self-pay

## 2021-09-05 NOTE — Telephone Encounter (Signed)
Spoke to [employer]

## 2021-09-05 NOTE — Telephone Encounter (Signed)
Noted  

## 2021-09-05 NOTE — Discharge Summary (Addendum)
Physician Discharge Summary   Patient: Tina Pacheco MRN: [id number] DOB: 04-04-1950  Admit date:     09/01/2021  Discharge date: 09/04/2021  Discharge Physician: Berle Mull  PCP: Venia Carbon, MD  Recommendations at discharge:  Follow up with PCP in 1 week  Discharge Diagnoses: Principal Problem:   Generalized weakness Active Problems:   HTN (hypertension)   CAD (coronary artery disease)   Paroxysmal A-fib (HCC)   COPD (chronic obstructive pulmonary disease) (HCC)   Acute-on-chronic kidney injury (HCC)   Pressure injury of skin   AMS (altered mental status)   Rhabdomyolysis   Hypokalemia   Sinus bradycardia  Hospital Course: Past medical history of HLD, HTN, CKD, HFpEF, chronic pain syndrome, arthritis, chronic anticoagulation.  Presents to the hospital with complaints of an unresponsive event at home after taking gabapentin. Reportedly the patient actually has been taking her Zaroxolyn on a daily basis with the concerned that if she will not take that she will gain weight.  She has not had another renal function checked recently.  She went to her dermatologist and had a steroid injection shot after which she started having more pain on her right knee and therefore decided to take 1 dose of gabapentin.  She takes gabapentin only occasionally and does not tolerate it well.  After this the patient was sleepy and drowsy and lethargic and was not able to wake up for almost 1 day and because of this family called EMS.  Assessment and Plan: Generalized weakness and deconditioning. Secondary to severe hypokalemia. Potassium level is 2.5 despite aggressive therapy. Therefore patient was given IV potassium. Currently potassium level is normal. Magnesium level is 2.1. Continue diuretic therapy and also initiate scheduled potassium and Aldactone and monitor.  Acute kidney injury on chronic kidney disease stage IIIb. Baseline serum creatinine appears to be around 1.2.  On  presentation serum creatinine was 2.37.  Currently trending down to 1.7. Patient was receiving IV fluid.  Which was discontinued. AKI is most likely secondary to overdiuresis with the use of Zaroxolyn. I will resume torsemide at a lower dose and monitor renal function.  TIA ruled out.  CVA ruled out.  Monitor.  Mild elevation of CK. No rhabdomyolysis. Patient was treated with IV fluids.  Currently on hold.  COPD. Does not appear to have any exacerbation. Continue inhalers.  Paroxysmal A-fib. Currently not in RVR. Amiodarone metoprolol and Eliquis at home.  Currently on [place] continuation of. Monitor.   CAD Does not appear to have any acute chest pain. Now not on any antiplatelet medication.  HTN. Blood pressure stable.  Morbid obesity. Arthritis. Body mass index is 48.82 kg/m.  Placing the patient at high risk of poor outcome.  Stage 2 pressure injury to buttocks present on admission  Dressing changes as recommended   Pressure Injury 09/01/21 Mid Stage 2 -  Partial thickness loss of dermis presenting as a shallow open injury with a red, pink wound bed without slough. (Active)  09/01/21 2216  Location:   Location Orientation: Mid  Staging: Stage 2 -  Partial thickness loss of dermis presenting as a shallow open injury with a red, pink wound bed without slough.  Wound Description (Comments):   Present on Admission: Yes  Dressing Type Foam - Lift dressing to assess site every shift 09/04/21 0715      PT recommended home health, family wants to go home.  Consultants: none Procedures performed:  none DISCHARGE MEDICATION: Allergies as of 09/04/2021  Reactions   Fluoxetine Other (See Comments)   Headache, shaking, sleep issues Headache, shaking, sleep issues   Phenergan [promethazine] Other (See Comments)   Sleepiness   Codeine    Nausea and vomiting/only when taking too much        Medication List     TAKE these medications    amiodarone 200 MG  tablet Commonly known as: PACERONE TAKE 1 TABLET(200 MG) BY MOUTH DAILY   apixaban 5 MG Tabs tablet Commonly known as: Eliquis TAKE 1 TABLET(5 MG) BY MOUTH TWICE DAILY   atorvastatin 40 MG tablet Commonly known as: LIPITOR TAKE 1 TABLET(40 MG) BY MOUTH DAILY   docusate sodium 100 MG capsule Commonly known as: Colace Take 1 capsule (100 mg total) by mouth 2 (two) times daily.   FDgard 25-20.75 MG Caps Generic drug: [place] Oil-Levomenthol Take as directed . Exp:12-2022   ketoconazole 2 % cream Commonly known as: NIZORAL APPLY A SMALL AMOUNT TO AFFECTED AREA ONCE A DAY TO RASH ON BUTTOCKS, GROIN   leflunomide 20 MG tablet Commonly known as: ARAVA Take 1 tablet (20 mg total) by mouth daily.   metolazone 2.5 MG tablet Commonly known as: ZAROXOLYN Take 1 tablet only for weight gain of 3 lbs in 1 day or 5 lbs in 1 week, when you take this medicine take it 30 minutes before torsemide. What changed: See the new instructions.   metoprolol succinate 50 MG 24 hr tablet Commonly known as: TOPROL-XL TAKE 1 TABLET BY MOUTH EVERY DAY WITH OR IMMEDIATELY FOLLOWING A MEAL   montelukast 10 MG tablet Commonly known as: SINGULAIR TAKE 1 TABLET BY MOUTH AT BEDTIME   nitroGLYCERIN 0.4 MG SL tablet Commonly known as: NITROSTAT Place 1 tablet (0.4 mg total) under the tongue every 5 (five) minutes as needed for chest pain.   nystatin powder Commonly known as: MYCOSTATIN/NYSTOP Apply 1 application topically 3 (three) times daily as needed.   ondansetron 4 MG disintegrating tablet Commonly known as: ZOFRAN-ODT Take 1 tablet (4 mg total) by mouth every 8 (eight) hours as needed for nausea or vomiting.   polyethylene glycol 17 g packet Commonly known as: MIRALAX / GLYCOLAX Take 17 g by mouth daily as needed for mild constipation.   predniSONE 5 MG tablet Commonly known as: DELTASONE Take 5 mg by mouth daily with breakfast.   silver sulfADIAZINE 1 % cream Commonly known as:  Silvadene Apply 1 application. topically daily. Apply 1 gram to infected area once a day   spironolactone 25 MG tablet Commonly known as: ALDACTONE TAKE 1 TABLET(25 MG) BY MOUTH DAILY   tacrolimus 0.1 % ointment Commonly known as: PROTOPIC Apply topically 2 (two) times daily. Apply to aa's lower legs as needed for rash   terazosin 10 MG capsule Commonly known as: HYTRIN TAKE 1 CAPSULE(10 MG) BY MOUTH AT BEDTIME   torsemide 20 MG tablet Commonly known as: DEMADEX Take 2 tablets (40 mg total) by mouth daily for 3 days, THEN 2 tablets (40 mg total) 2 (two) times daily. Start taking on: September 04, 2021 What changed: See the new instructions.   triamcinolone ointment 0.1 % Commonly known as: KENALOG Apply twice daily to affected areas of rash at legs. Avoid applying to face, groin, and axilla. Use as directed. Long-term use can cause thinning of the skin.               Discharge Care Instructions  (From admission, onward)           Start  Ordered   09/04/21 0000  Discharge wound care:       Comments: Cleanse  with soap and water, rinse and dry. Cover with silicone foam. Change daily and PRN soiling. Position off of affected area.   09/04/21 1022            Follow-up Information     Venia Carbon, MD. Schedule an appointment as soon as possible for a visit in 2 week(s).   Specialties: Internal Medicine, Pediatrics Why: BMP in 1 week Contact information: [place] [address] [phone]                Disposition: Home Diet recommendation: Cardiac diet  Discharge Exam: Vitals:   Oct 02, 2021 2331 Oct 02, 2021 2331 09/04/21 0525 09/04/21 0715  BP: (!) 160/70 (!) 160/70 (!) 167/71 (!) 150/67  Pulse:  66 68 73  Resp:   20 19  Temp:   97.9 F (36.6 C) 97.9 F (36.6 C)  TempSrc:   Oral   SpO2:   95% 96%  Weight:   125.2 kg   Height:       General: Appear in mild distress; no visible Abnormal Neck Mass Or lumps, Conjunctiva  normal Cardiovascular: S1 and S2 Present, no Murmur, Respiratory: good respiratory effort, Bilateral Air entry present and CTA, no Crackles, no wheezes Abdomen: Bowel Sound present, Non tender  Extremities: trace Pedal edema Neurology: alert and oriented to time, place, and person  Gait not checked due to patient safety concerns Filed Weights   09/02/21 1200 02-Oct-2021 0500 09/04/21 0525  Weight: 125 kg 125 kg 125.2 kg   Condition at discharge: stable  The results of significant diagnostics from this hospitalization (including imaging, microbiology, ancillary and laboratory) are listed below for reference.   Imaging Studies: EEG adult  Result Date: 10/02/21 Derek Jack, MD     10/02/21  8:31 AM Routine EEG Report JENETTA WEASE is a 71 y.o. female with a history of encephalopathy who is undergoing an EEG to evaluate for seizures. Report: This EEG was acquired with electrodes placed according to the International 10-20 electrode system (including Fp1, Fp2, F3, F4, C3, C4, P3, P4, O1, O2, T3, T4, T5, T6, A1, A2, Fz, Cz, Pz). The following electrodes were missing or displaced: none. The occipital dominant rhythm was 7-8 Hz. This activity is reactive to stimulation. Drowsiness was manifested by background fragmentation; deeper stages of sleep were not identified. There was no focal slowing. There were no interictal epileptiform discharges. There were no electrographic seizures identified. Photic stimulation and hyperventilation were not performed. Impression and clinical correlation: This EEG was obtained while awake and drowsy and is abnormal due to mild diffuse slowing indicative of global cerebral dysfunction. Epileptiform abnormalities were not seen during this recording. Su Monks, MD [facility] [phone] If 7pm- 7am, please page neurology on call as listed in [place].   US Venous Img Lower Bilateral (DVT)  Result Date: 09/02/2021 CLINICAL DATA:  Lower extremity swelling  EXAM: BILATERAL LOWER EXTREMITY VENOUS DOPPLER ULTRASOUND TECHNIQUE: Gray-scale sonography with graded compression, as well as color Doppler and duplex ultrasound were performed to evaluate the lower extremity deep venous systems from the level of the common femoral vein and including the common femoral, femoral, profunda femoral, popliteal and calf veins including the posterior tibial, peroneal and gastrocnemius veins when visible. The superficial great saphenous vein was also interrogated. Spectral Doppler was utilized to evaluate flow at rest and with distal augmentation maneuvers in the common  femoral, femoral and popliteal veins. COMPARISON:  None Available. FINDINGS: RIGHT LOWER EXTREMITY Common Femoral Vein: No evidence of thrombus. Normal compressibility, respiratory phasicity and response to augmentation. Saphenofemoral Junction: No evidence of thrombus. Normal compressibility and flow on color Doppler imaging. Profunda Femoral Vein: No evidence of thrombus. Normal compressibility and flow on color Doppler imaging. Femoral Vein: No evidence of thrombus. Normal compressibility, respiratory phasicity and response to augmentation. Popliteal Vein: No evidence of thrombus. Normal compressibility, respiratory phasicity and response to augmentation. Calf Veins: No evidence of thrombus. Normal compressibility and flow on color Doppler imaging. Superficial Great Saphenous Vein: No evidence of thrombus. Normal compressibility. Venous Reflux:  None. Other Findings:  None. LEFT LOWER EXTREMITY Common Femoral Vein: No evidence of thrombus. Normal compressibility, respiratory phasicity and response to augmentation. Saphenofemoral Junction: No evidence of thrombus. Normal compressibility and flow on color Doppler imaging. Profunda Femoral Vein: No evidence of thrombus. Normal compressibility and flow on color Doppler imaging. Femoral Vein: No evidence of thrombus. Normal compressibility, respiratory phasicity and response  to augmentation. Popliteal Vein: No evidence of thrombus. Normal compressibility, respiratory phasicity and response to augmentation. Calf Veins: No evidence of thrombus. Normal compressibility and flow on color Doppler imaging. Superficial Great Saphenous Vein: No evidence of thrombus. Normal compressibility. Venous Reflux:  None. Other Findings: Fluid collection in the popliteal fossa measures 4.8 x 3.2 x 1.1 cm. Findings are most consistent with that of a Baker's cyst. IMPRESSION: No evidence of deep venous thrombosis in either lower extremity. Left-sided Baker's cyst measuring 4.8 x 3.2 x 1.1 cm. Electronically Signed   By: Jacqulynn Cadet M.D.   On: 09/02/2021 10:06   MR BRAIN WO CONTRAST  Result Date: 09/01/2021 CLINICAL DATA:  Altered mental status EXAM: MRI HEAD WITHOUT CONTRAST MRI CERVICAL SPINE WITHOUT CONTRAST TECHNIQUE: Multiplanar, multiecho pulse sequences of the brain and surrounding structures, and cervical spine, to include the craniocervical junction and cervicothoracic junction, were obtained without intravenous contrast. COMPARISON:  None Available. FINDINGS: MRI HEAD FINDINGS Brain: No acute infarct, mass effect or extra-axial collection. No acute or chronic hemorrhage. There is confluent hyperintense T2-weighted signal within the white matter. Generalized volume loss. The midline structures are normal. Vascular: Major flow voids are preserved. Skull and upper cervical spine: Normal calvarium and skull base. Visualized upper cervical spine and soft tissues are normal. Sinuses/Orbits:No paranasal sinus fluid levels or advanced mucosal thickening. No mastoid or middle ear effusion. Normal orbits. MRI CERVICAL SPINE FINDINGS Alignment: Physiologic. Vertebrae: No fracture, evidence of discitis, or bone lesion. Cord: Normal signal and morphology. Posterior Fossa, vertebral arteries, paraspinal tissues: Negative. Disc levels: C1-2: Unremarkable. C2-3: Normal disc space and facet joints. There is  no spinal canal stenosis. No neural foraminal stenosis. C3-4: Normal disc space and facet joints. There is no spinal canal stenosis. No neural foraminal stenosis. C4-5: Right-greater-than-left facet hypertrophy. No disc herniation. There is no spinal canal stenosis. No neural foraminal stenosis. C5-6: Right-greater-than-left facet hypertrophy. No disc herniation. There is no spinal canal stenosis. No neural foraminal stenosis. C6-7: Normal disc space and facet joints. There is no spinal canal stenosis. No neural foraminal stenosis. C7-T1: Normal disc space and facet joints. There is no spinal canal stenosis. No neural foraminal stenosis. IMPRESSION: 1. No acute intracranial abnormality. 2. Confluent chronic ischemic white matter changes. 3. Mild cervical degenerative disc disease without spinal canal or neural foraminal stenosis. Electronically Signed   By: Ulyses Jarred M.D.   On: 09/01/2021 23:07   MR CERVICAL SPINE WO CONTRAST  Result Date:  09/01/2021 CLINICAL DATA:  Altered mental status EXAM: MRI HEAD WITHOUT CONTRAST MRI CERVICAL SPINE WITHOUT CONTRAST TECHNIQUE: Multiplanar, multiecho pulse sequences of the brain and surrounding structures, and cervical spine, to include the craniocervical junction and cervicothoracic junction, were obtained without intravenous contrast. COMPARISON:  None Available. FINDINGS: MRI HEAD FINDINGS Brain: No acute infarct, mass effect or extra-axial collection. No acute or chronic hemorrhage. There is confluent hyperintense T2-weighted signal within the white matter. Generalized volume loss. The midline structures are normal. Vascular: Major flow voids are preserved. Skull and upper cervical spine: Normal calvarium and skull base. Visualized upper cervical spine and soft tissues are normal. Sinuses/Orbits:No paranasal sinus fluid levels or advanced mucosal thickening. No mastoid or middle ear effusion. Normal orbits. MRI CERVICAL SPINE FINDINGS Alignment: Physiologic.  Vertebrae: No fracture, evidence of discitis, or bone lesion. Cord: Normal signal and morphology. Posterior Fossa, vertebral arteries, paraspinal tissues: Negative. Disc levels: C1-2: Unremarkable. C2-3: Normal disc space and facet joints. There is no spinal canal stenosis. No neural foraminal stenosis. C3-4: Normal disc space and facet joints. There is no spinal canal stenosis. No neural foraminal stenosis. C4-5: Right-greater-than-left facet hypertrophy. No disc herniation. There is no spinal canal stenosis. No neural foraminal stenosis. C5-6: Right-greater-than-left facet hypertrophy. No disc herniation. There is no spinal canal stenosis. No neural foraminal stenosis. C6-7: Normal disc space and facet joints. There is no spinal canal stenosis. No neural foraminal stenosis. C7-T1: Normal disc space and facet joints. There is no spinal canal stenosis. No neural foraminal stenosis. IMPRESSION: 1. No acute intracranial abnormality. 2. Confluent chronic ischemic white matter changes. 3. Mild cervical degenerative disc disease without spinal canal or neural foraminal stenosis. Electronically Signed   By: Ulyses Jarred M.D.   On: 09/01/2021 23:07   DG Knee 2 Views Left  Result Date: 09/01/2021 CLINICAL DATA:  Generalized weakness, pain EXAM: LEFT KNEE - 1-2 VIEW COMPARISON:  None Available. FINDINGS: Frontal and cross-table lateral views of the left knee are obtained. There is severe 3 compartmental osteoarthritis. No fracture, subluxation, or dislocation. No joint effusion. Diffuse subcutaneous edema. IMPRESSION: 1. Three compartmental osteoarthritis.  No acute fracture. Electronically Signed   By: Randa Ngo M.D.   On: 09/01/2021 17:36   DG Chest 2 View  Result Date: 09/01/2021 CLINICAL DATA:  Generalized weakness, hypoxia EXAM: CHEST - 2 VIEW COMPARISON:  04/20/2021 FINDINGS: Frontal and lateral views of the chest demonstrate an unremarkable cardiac silhouette. Chronic interstitial scarring compatible with  emphysema. No airspace disease, effusion, or pneumothorax. Stable right shoulder arthroplasty. IMPRESSION: 1. Stable emphysema, no acute process. Electronically Signed   By: Randa Ngo M.D.   On: 09/01/2021 17:35   CT Head Wo Contrast  Result Date: 09/01/2021 CLINICAL DATA:  Mental status change, unknown cause. Generalized weakness and fatigue. EXAM: CT HEAD WITHOUT CONTRAST TECHNIQUE: Contiguous axial images were obtained from the base of the skull through the vertex without intravenous contrast. RADIATION DOSE REDUCTION: This exam was performed according to the departmental dose-optimization program which includes automated exposure control, adjustment of the mA and/or kV according to patient size and/or use of iterative reconstruction technique. COMPARISON:  Head CT 04/20/2021 FINDINGS: Brain: There is no evidence of an acute infarct, intracranial hemorrhage, mass, midline shift, or extra-axial fluid collection. Mild cerebral atrophy is within normal limits for age. Confluent hypodensities in the cerebral white matter bilaterally are unchanged and nonspecific but compatible with extensive chronic small vessel ischemic disease. Vascular: Calcified atherosclerosis at the skull base. No hyperdense vessel. Skull: No fracture  or suspicious osseous lesion. Sinuses/Orbits: Persistent fluid in the left sphenoid sinus. Mild mucosal thickening in the maxillary sinuses. Clear mastoid air cells. Bilateral cataract extraction. Other: None. IMPRESSION: 1. No evidence of acute intracranial abnormality. 2. Extensive chronic small vessel ischemic disease. Electronically Signed   By: Logan Bores M.D.   On: 09/01/2021 17:17    Microbiology: Results for orders placed or performed during the hospital encounter of 04/20/21  Resp Panel by RT-PCR (Flu A&B, Covid) Nasopharyngeal Swab     Status: None   Collection Time: 04/20/21 11:26 PM   Specimen: Nasopharyngeal Swab; Nasopharyngeal(NP) swabs in vial transport medium   Result Value Ref Range Status   SARS Coronavirus 2 by RT PCR NEGATIVE NEGATIVE Final    Comment: (NOTE) SARS-CoV-2 target nucleic acids are NOT DETECTED.  The SARS-CoV-2 RNA is generally detectable in upper respiratory specimens during the acute phase of infection. The lowest concentration of SARS-CoV-2 viral copies this assay can detect is 138 copies/mL. A negative result does not preclude SARS-Cov-2 infection and should not be used as the sole basis for treatment or other patient management decisions. A negative result may occur with  improper specimen collection/handling, submission of specimen other than nasopharyngeal swab, presence of viral mutation(s) within the areas targeted by this assay, and inadequate number of viral copies(<138 copies/mL). A negative result must be combined with clinical observations, patient history, and epidemiological information. The expected result is Negative.  Fact Sheet for Patients:  EntrepreneurPulse.com.au  Fact Sheet for Healthcare Providers:  IncredibleEmployment.be  This test is no t yet approved or cleared by the [place] FDA and  has been authorized for detection and/or diagnosis of SARS-CoV-2 by FDA under an Emergency Use Authorization (EUA). This EUA will remain  in effect (meaning this test can be used) for the duration of the COVID-19 declaration under Section 564(b)(1) of the Act, 21 U.S.C.section 360bbb-3(b)(1), unless the authorization is terminated  or revoked sooner.       Influenza A by PCR NEGATIVE NEGATIVE Final   Influenza B by PCR NEGATIVE NEGATIVE Final    Comment: (NOTE) The Xpert Xpress SARS-CoV-2/FLU/RSV plus assay is intended as an aid in the diagnosis of influenza from Nasopharyngeal swab specimens and should not be used as a sole basis for treatment. Nasal washings and aspirates are unacceptable for Xpert Xpress SARS-CoV-2/FLU/RSV testing.  Fact Sheet for  Patients: EntrepreneurPulse.com.au  Fact Sheet for Healthcare Providers: IncredibleEmployment.be  This test is not yet approved or cleared by the [place] FDA and has been authorized for detection and/or diagnosis of SARS-CoV-2 by FDA under an Emergency Use Authorization (EUA). This EUA will remain in effect (meaning this test can be used) for the duration of the COVID-19 declaration under Section 564(b)(1) of the Act, 21 U.S.C. section 360bbb-3(b)(1), unless the authorization is terminated or revoked.  Performed at [facility], [address] [place], [place] [address]     Labs: CBC: Recent Labs  Lab 09/01/21 1337 09/02/21 0344 09/03/21 0631 09/04/21 0406  WBC 10.1 9.1 6.1 7.4  HGB 10.9* 10.3* 10.0* 10.7*  HCT 33.7* 32.4* 31.0* 33.7*  MCV 88.7 87.6 87.8 88.9  PLT 217 211 215 938   Basic Metabolic Panel: Recent Labs  Lab 09/01/21 1337 09/01/21 1711 09/02/21 0344 09/02/21 1524 09/03/21 0631 09/04/21 0406  NA 134*  --  136 136 137 138  K 2.9*  --  2.8* 3.2* 3.7 4.3  CL 90*  --  96* 96* 101 102  CO2 29  --  $'31 29 28 27  'N$ GLUCOSE 150*  --  129* 112* 96 87  BUN 68*  --  63* 57* 44* 37*  CREATININE 2.37*  --  1.98* 1.74* 1.42* 1.48*  CALCIUM 9.5  --  9.1 9.6 9.8 9.9  MG  --  1.7  --  2.0 2.1 1.9   Liver Function Tests: Recent Labs  Lab 09/02/21 0344 09/03/21 0631 09/04/21 0406  AST 36 32 30  ALT '28 24 23  '$ ALKPHOS 111 97 90  BILITOT 1.7* 1.0 1.2  PROT 6.6 6.7 6.5  ALBUMIN 3.2* 2.9* 2.9*   CBG: No results for input(s): "GLUCAP" in the last 168 hours.  Discharge time spent: greater than 30 minutes.  Signed: Berle Mull, MD [facility] 09/04/2021

## 2021-09-05 NOTE — Telephone Encounter (Signed)
Tiffany with [facility] called and said the orders were put in for Nursing, PT and OT. She said the patient declined the PT and OT and that [facility] be able to get out to patients home to do evaluation for nursing until Wednesday 09/07/21. Tiffany said she needs a verbal call back for it at [phone] Option 2.

## 2021-09-05 NOTE — Telephone Encounter (Signed)
.  Transition Care Management Follow-up Telephone Call Date of discharge and from where: D/c from [facility] on 7/9 for AKI and altered mental status How have you been since you were released from the hospital? Doing well but has declined with PT Any questions or concerns? No  Items Reviewed: Did the pt receive and understand the discharge instructions provided?  Daughter helps and has all the d/c paperwork Medications obtained and verified? No , pt reports her daughter takes care of meds to assure she is taking correctly Other? No  Any new allergies since your discharge? No  Dietary orders reviewed? No Do you have support at home? Yes   Home Care and Equipment/Supplies: Were home health services ordered? yes If so, what is the name of the agency? [facility]  Has the agency set up a time to come to the patient's home? NO, pt advised if not contacted today or tomorrow to contact office so they could f/u with the company. Were any new equipment or medical supplies ordered?  No What is the name of the medical supply agency? N/a Were you able to get the supplies/equipment? not applicable Do you have any questions related to the use of the equipment or supplies? No  Functional Questionnaire: (I = Independent and D = Dependent) ADLs: D, weak currently but reports she used to  Bathing/Dressing- D, weak currently but reports she used to  Meal Prep- D  Eating- I  Maintaining continence- I  Transferring/Ambulation- D  Managing Meds- D  Follow up appointments reviewed:  PCP Hospital f/u appt confirmed? Yes  Scheduled to see Dr. Silvio Pate on 7/31 @ 3:30 for wellness exam. Pt unable to make hospital f/u apt currently because she needs to talk to her husband and daughter about transportation. Pt given number to clinic to make hospital f/u.  [facility] f/u appt confirmed? No  Scheduled to see n/a on n/a @ n/a. Are transportation arrangements needed? No , has husband and daughter for  transportation If their condition worsens, is the pt aware to call PCP or go to the Emergency Dept.? Yes Was the patient provided with contact information for the PCP's office or ED? Yes Was to pt encouraged to call back with questions or concerns? Yes

## 2021-09-06 ENCOUNTER — Telehealth: Payer: Self-pay

## 2021-09-06 NOTE — Telephone Encounter (Signed)
Per appt notes pt has already been scheduled HFU with Dr Silvio Pate on 09/09/21 at 11 AM. Sending note to [facility] CMA.

## 2021-09-06 NOTE — Telephone Encounter (Signed)
[facility] Night - Client Nonclinical Telephone Record  [facility] Client [facility] Night - Client Client Site [place] Provider [name] Contact Type Call Who Is Calling Patient / Member / Family / Caregiver Caller Name Tyro Phone Number [phone] Patient Name Tina Pacheco Patient DOB [birth year] Call Type Message Only Information Provided Reason for Call Request to Schedule Office Appointment Initial Comment Caller states she would like to make an appointment. Patient request to speak to RN No Additional Comment Office hours provided Disp. Time Disposition Final User 09/06/2021 7:43:54 AM General Information Provided Yes Achilles Dunk Call Closed By: Achilles Dunk Transaction Date/Time: 09/06/2021 7:41:30 AM (ET

## 2021-09-08 ENCOUNTER — Telehealth: Payer: Self-pay | Admitting: Internal Medicine

## 2021-09-08 DIAGNOSIS — N179 Acute kidney failure, unspecified: Secondary | ICD-10-CM | POA: Diagnosis not present

## 2021-09-08 DIAGNOSIS — Z6841 Body Mass Index (BMI) 40.0 and over, adult: Secondary | ICD-10-CM | POA: Diagnosis not present

## 2021-09-08 DIAGNOSIS — D631 Anemia in chronic kidney disease: Secondary | ICD-10-CM | POA: Diagnosis not present

## 2021-09-08 DIAGNOSIS — Z4800 Encounter for change or removal of nonsurgical wound dressing: Secondary | ICD-10-CM | POA: Diagnosis not present

## 2021-09-08 DIAGNOSIS — Z79891 Long term (current) use of opiate analgesic: Secondary | ICD-10-CM | POA: Diagnosis not present

## 2021-09-08 DIAGNOSIS — L89892 Pressure ulcer of other site, stage 2: Secondary | ICD-10-CM | POA: Diagnosis not present

## 2021-09-08 DIAGNOSIS — I2510 Atherosclerotic heart disease of native coronary artery without angina pectoris: Secondary | ICD-10-CM | POA: Diagnosis not present

## 2021-09-08 DIAGNOSIS — Z7952 Long term (current) use of systemic steroids: Secondary | ICD-10-CM | POA: Diagnosis not present

## 2021-09-08 DIAGNOSIS — M1990 Unspecified osteoarthritis, unspecified site: Secondary | ICD-10-CM | POA: Diagnosis not present

## 2021-09-08 DIAGNOSIS — Z7901 Long term (current) use of anticoagulants: Secondary | ICD-10-CM | POA: Diagnosis not present

## 2021-09-08 DIAGNOSIS — J449 Chronic obstructive pulmonary disease, unspecified: Secondary | ICD-10-CM | POA: Diagnosis not present

## 2021-09-08 DIAGNOSIS — N1832 Chronic kidney disease, stage 3b: Secondary | ICD-10-CM | POA: Diagnosis not present

## 2021-09-08 DIAGNOSIS — M1030 Gout due to renal impairment, unspecified site: Secondary | ICD-10-CM | POA: Diagnosis not present

## 2021-09-08 DIAGNOSIS — G4730 Sleep apnea, unspecified: Secondary | ICD-10-CM | POA: Diagnosis not present

## 2021-09-08 DIAGNOSIS — G8929 Other chronic pain: Secondary | ICD-10-CM | POA: Diagnosis not present

## 2021-09-08 DIAGNOSIS — E785 Hyperlipidemia, unspecified: Secondary | ICD-10-CM | POA: Diagnosis not present

## 2021-09-08 DIAGNOSIS — I130 Hypertensive heart and chronic kidney disease with heart failure and stage 1 through stage 4 chronic kidney disease, or unspecified chronic kidney disease: Secondary | ICD-10-CM | POA: Diagnosis not present

## 2021-09-08 DIAGNOSIS — G43909 Migraine, unspecified, not intractable, without status migrainosus: Secondary | ICD-10-CM | POA: Diagnosis not present

## 2021-09-08 DIAGNOSIS — M069 Rheumatoid arthritis, unspecified: Secondary | ICD-10-CM | POA: Diagnosis not present

## 2021-09-08 DIAGNOSIS — I5032 Chronic diastolic (congestive) heart failure: Secondary | ICD-10-CM | POA: Diagnosis not present

## 2021-09-08 DIAGNOSIS — F40240 Claustrophobia: Secondary | ICD-10-CM | POA: Diagnosis not present

## 2021-09-08 DIAGNOSIS — Z91199 Patient's noncompliance with other medical treatment and regimen due to unspecified reason: Secondary | ICD-10-CM | POA: Diagnosis not present

## 2021-09-08 DIAGNOSIS — Z9181 History of falling: Secondary | ICD-10-CM | POA: Diagnosis not present

## 2021-09-08 DIAGNOSIS — I480 Paroxysmal atrial fibrillation: Secondary | ICD-10-CM | POA: Diagnosis not present

## 2021-09-08 NOTE — Telephone Encounter (Signed)
[place] Name: [facility] Agency Name: Jobie Quaker Phone #: [phone]) Service Requested: Skilled Nursing (examples: OT/PT/Skilled Nursing/Social Work/Speech Therapy/Wound Care) Frequency of Visits: 1 week 9 for wound care to right heel and education on congestive heart failure

## 2021-09-09 ENCOUNTER — Encounter: Payer: Self-pay | Admitting: Internal Medicine

## 2021-09-09 ENCOUNTER — Ambulatory Visit (INDEPENDENT_AMBULATORY_CARE_PROVIDER_SITE_OTHER): Payer: Medicare Other | Admitting: Internal Medicine

## 2021-09-09 ENCOUNTER — Telehealth: Payer: Self-pay | Admitting: Internal Medicine

## 2021-09-09 VITALS — BP 122/82 | HR 50 | Temp 97.9°F | Ht 63.0 in | Wt 261.0 lb

## 2021-09-09 DIAGNOSIS — I2510 Atherosclerotic heart disease of native coronary artery without angina pectoris: Secondary | ICD-10-CM | POA: Diagnosis not present

## 2021-09-09 DIAGNOSIS — I5032 Chronic diastolic (congestive) heart failure: Secondary | ICD-10-CM

## 2021-09-09 DIAGNOSIS — E876 Hypokalemia: Secondary | ICD-10-CM

## 2021-09-09 DIAGNOSIS — N1832 Chronic kidney disease, stage 3b: Secondary | ICD-10-CM

## 2021-09-09 DIAGNOSIS — G894 Chronic pain syndrome: Secondary | ICD-10-CM

## 2021-09-09 DIAGNOSIS — I480 Paroxysmal atrial fibrillation: Secondary | ICD-10-CM | POA: Diagnosis not present

## 2021-09-09 DIAGNOSIS — F1120 Opioid dependence, uncomplicated: Secondary | ICD-10-CM

## 2021-09-09 LAB — RENAL FUNCTION PANEL
Albumin: 3.9 g/dL (ref 3.5–5.2)
BUN: 32 mg/dL — ABNORMAL HIGH (ref 6–23)
CO2: 27 mEq/L (ref 19–32)
Calcium: 10.9 mg/dL — ABNORMAL HIGH (ref 8.4–10.5)
Chloride: 100 mEq/L (ref 96–112)
Creatinine, Ser: 1.54 mg/dL — ABNORMAL HIGH (ref 0.40–1.20)
GFR: 33.74 mL/min — ABNORMAL LOW (ref >60.00)
Glucose, Bld: 102 mg/dL — ABNORMAL HIGH (ref 70–99)
Phosphorus: 2.4 mg/dL (ref 2.3–4.6)
Potassium: 3.2 mEq/L — ABNORMAL LOW (ref 3.5–5.1)
Sodium: 137 mEq/L (ref 135–145)

## 2021-09-09 LAB — MAGNESIUM: Magnesium: 1.6 mg/dL (ref 1.5–2.5)

## 2021-09-09 MED ORDER — TORSEMIDE 20 MG PO TABS: 40.0000 mg | ORAL_TABLET | Freq: Every day | ORAL | 0 refills | Status: DC

## 2021-09-09 MED ORDER — METOLAZONE 2.5 MG PO TABS: 2.5000 mg | ORAL_TABLET | Freq: Every day | ORAL | 0 refills | Status: DC | PRN

## 2021-09-09 MED ORDER — TORSEMIDE 20 MG PO TABS: ORAL_TABLET | ORAL | 0 refills | Status: DC

## 2021-09-09 NOTE — Progress Notes (Signed)
Subjective:    Patient ID: Tina Pacheco, female    DOB: 1950/11/17, 71 y.o.   MRN: [id number]  HPI Here with daughter for hospital follow up Husband had stroke---will need inpatient rehab Daughter staying with her---will be getting aide soon  Husband and daughter had found her  Couldn't wake up May have taken extra metolazone and got drowsy with gabapentin  AKI and potassium 2.5 Needed IV replacement Low magnesium also GFR back into the 30's  Home health Agreed to nurse---to check leg wounds Declined therapy though  No SOB--stable DOE when she does something Uses wheelchair and walker to get around house Commode at night Edema is better No palpitations  Current Outpatient Medications on File Prior to Visit  Medication Sig Dispense Refill   amiodarone (PACERONE) 200 MG tablet TAKE 1 TABLET(200 MG) BY MOUTH DAILY 30 tablet 0   apixaban (ELIQUIS) 5 MG TABS tablet TAKE 1 TABLET(5 MG) BY MOUTH TWICE DAILY 60 tablet 0   atorvastatin (LIPITOR) 40 MG tablet TAKE 1 TABLET(40 MG) BY MOUTH DAILY 90 tablet 3   docusate sodium (COLACE) 100 MG capsule Take 1 capsule (100 mg total) by mouth 2 (two) times daily. 60 capsule 2   ketoconazole (NIZORAL) 2 % cream APPLY A SMALL AMOUNT TO AFFECTED AREA ONCE A DAY TO RASH ON BUTTOCKS, GROIN 60 g 1   leflunomide (ARAVA) 20 MG tablet Take 1 tablet (20 mg total) by mouth daily. 30 tablet 0   metolazone (ZAROXOLYN) 2.5 MG tablet Take 1 tablet only for weight gain of 3 lbs in 1 day or 5 lbs in 1 week, when you take this medicine take it 30 minutes before torsemide. 30 tablet 0   metoprolol succinate (TOPROL-XL) 50 MG 24 hr tablet TAKE 1 TABLET BY MOUTH EVERY DAY WITH OR IMMEDIATELY FOLLOWING A MEAL 90 tablet 3   montelukast (SINGULAIR) 10 MG tablet TAKE 1 TABLET BY MOUTH AT BEDTIME 90 tablet 3   nitroGLYCERIN (NITROSTAT) 0.4 MG SL tablet Place 1 tablet (0.4 mg total) under the tongue every 5 (five) minutes as needed for chest pain. 20 tablet 12    nystatin (MYCOSTATIN/NYSTOP) powder Apply 1 application topically 3 (three) times daily as needed. 60 g 0   ondansetron (ZOFRAN-ODT) 4 MG disintegrating tablet Take 1 tablet (4 mg total) by mouth every 8 (eight) hours as needed for nausea or vomiting. 30 tablet 1   polyethylene glycol (MIRALAX / GLYCOLAX) packet Take 17 g by mouth daily as needed for mild constipation. 14 each 0   potassium chloride SA (KLOR-CON M) 20 MEQ tablet Take 20 mEq by mouth daily.     predniSONE (DELTASONE) 5 MG tablet Take 5 mg by mouth daily with breakfast.     silver sulfADIAZINE (SILVADENE) 1 % cream Apply 1 application. topically daily. Apply 1 gram to infected area once a day 50 g 0   spironolactone (ALDACTONE) 25 MG tablet TAKE 1 TABLET(25 MG) BY MOUTH DAILY 30 tablet 11   tacrolimus (PROTOPIC) 0.1 % ointment Apply topically 2 (two) times daily. Apply to aa's lower legs as needed for rash 100 g 3   terazosin (HYTRIN) 10 MG capsule TAKE 1 CAPSULE(10 MG) BY MOUTH AT BEDTIME 90 capsule 3   triamcinolone ointment (KENALOG) 0.1 % Apply twice daily to affected areas of rash at legs. Avoid applying to face, groin, and axilla. Use as directed. Long-term use can cause thinning of the skin. 454 g 0   No current facility-administered medications on file  prior to visit.    Allergies  Allergen Reactions   Fluoxetine Other (See Comments)    Headache, shaking, sleep issues Headache, shaking, sleep issues   Phenergan [Promethazine] Other (See Comments)    Sleepiness   Codeine     Nausea and vomiting/only when taking too much    Past Medical History:  Diagnosis Date   Anemia    Arthritis    RA   Asthma    Basal cell carcinoma    Cataract 2018   bilateral eyes; corrected with surgery   Chronic diastolic CHF (congestive heart failure) (HCC)    Claustrophobia    Collagen vascular disease (HCC)    RA   COPD (chronic obstructive pulmonary disease) (HCC)    Diastolic dysfunction    a. echo 07/2014: EF 55-60%, no RWMA,  GR2DD, mild MR, LA moderately dilated, PASP 38 mm Hg   Dyslipidemia    Headache    migraines   Hemihypertrophy    History of cardiac cath    a. 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 on Pradaxa; b. CHADSVASc at least 2 (HTN & female)   Rheumatoid arthritis(714.0)    Sleep apnea    a. not compliant with CPAP   Stage 3 chronic kidney disease ([place])     Past Surgical History:  Procedure Laterality Date   ABDOMINAL HYSTERECTOMY     APPLICATION OF WOUND VAC Right 08/09/2017   Procedure: APPLICATION OF WOUND VAC;  Surgeon: Albertine Patricia, DPM;  Location: [facility];  Service: Podiatry;  Laterality: Right;   CARDIAC CATHETERIZATION  05/24/2010   nonobstructive CAD   CATARACT EXTRACTION W/ INTRAOCULAR LENS IMPLANT Right 06/12/2016   Dr. Darleen Crocker   CATARACT EXTRACTION W/ INTRAOCULAR LENS IMPLANT Left 06/26/2016   Dr. Darleen Crocker   CESAREAN SECTION     CHOLECYSTECTOMY     COLONOSCOPY  06/12/2011   Procedure: COLONOSCOPY;  Surgeon: Juanita Craver, MD;  Location: [facility];  Service: Endoscopy;  Laterality: N/A;   COLONOSCOPY N/A 03/17/2013   Procedure: COLONOSCOPY;  Surgeon: Juanita Craver, MD;  Location: [facility];  Service: Endoscopy;  Laterality: N/A;   EYE SURGERY     FOOT ARTHRODESIS Right 07/13/2017   Procedure: ARTHRODESIS FOOT-MULTI.FUSIONS (6 JOINTS);  Surgeon: Albertine Patricia, DPM;  Location: [facility];  Service: Podiatry;  Laterality: Right;   IRRIGATION AND DEBRIDEMENT FOOT Right 08/09/2017   Procedure: IRRIGATION AND DEBRIDEMENT FOOT;  Surgeon: Albertine Patricia, DPM;  Location: [facility];  Service: Podiatry;  Laterality: Right;   KNEE ARTHROSCOPY     bilateral   LEFT HEART CATH AND CORONARY ANGIOGRAPHY N/A 06/04/2020   Procedure: LEFT HEART CATH AND CORONARY ANGIOGRAPHY;  Surgeon: Wellington Hampshire, MD;   Location: [facility];  Service: Cardiovascular;  Laterality: N/A;   REVERSE SHOULDER ARTHROPLASTY Right 11/16/2020   Procedure: REVERSE SHOULDER ARTHROPLASTY;  Surgeon: Corky Mull, MD;  Location: [facility];  Service: Orthopedics;  Laterality: Right;   SHOULDER CLOSED REDUCTION Right 11/11/2020   Procedure: CLOSED REDUCTION SHOULDER;  Surgeon: Corky Mull, MD;  Location: [facility];  Service: Orthopedics;  Laterality: Right;   SHOULDER INJECTION Right 11/11/2020   Procedure: SHOULDER INJECTION;  Surgeon: Corky Mull, MD;  Location: [facility];  Service: Orthopedics;  Laterality: Right;   SINUSOTOMY  TOOTH EXTRACTION  12/2016   VAGINAL HYSTERECTOMY      Family History  Problem Relation Age of Onset   Tuberculosis Mother    Parkinsonism Mother    Emphysema Father        smoked   Arthritis Father    Cancer Sister    Diabetes type II Sister    Breast cancer Sister    Tuberculosis Sister    Breast cancer Maternal Aunt    Colon cancer Neg Hx    Esophageal cancer Neg Hx    Pancreatic cancer Neg Hx    Stomach cancer Neg Hx     Social History   Socioeconomic History   Marital status: Married    Spouse name: Not on file   Number of children: 1   Years of education: Not on file   Highest education level: Not on file  Occupational History   Occupation: Furniture conservator/restorer: [employer]    Comment: Retired 2007  Tobacco Use   Smoking status: Never    Passive exposure: Current   Smokeless tobacco: Never  Vaping Use   Vaping Use: Never used  Substance and Sexual Activity   Alcohol use: No   Drug use: No   Sexual activity: Not Currently  Other Topics Concern   Not on file  Social History Narrative   No living will   Husband, then daughter should be decision maker   Would accept resuscitation attempts   Not sure about tube feeds   Social Determinants of Health   Financial Resource Strain: Not on file  Food Insecurity: No Food Insecurity (07/08/2020)   Hunger  Vital Sign    Worried About Running Out of Food in the Last Year: Never true    Ran Out of Food in the Last Year: Never true  Transportation Needs: No Transportation Needs (07/08/2020)   PRAPARE - Hydrologist (Medical): No    Lack of Transportation (Non-Medical): No  Physical Activity: Not on file  Stress: Not on file  Social Connections: Not on file  Intimate Partner Violence: Not on file   Review of Systems Appetite is off Pain control is acceptable--taking 1/4 of the oxycodone every 6 hours     Objective:   Physical Exam Constitutional:      Appearance: Normal appearance.  Cardiovascular:     Rate and Rhythm: Normal rate and regular rhythm.     Heart sounds: No murmur heard.    No gallop.     Comments: Rate ~60 Feet warm without palpable pulses Musculoskeletal:     Cervical back: Neck supple.     Comments: No edema now  Lymphadenopathy:     Cervical: No cervical adenopathy.  Skin:    Comments: Right foot blister now open and covered with dressing from home care RN  Neurological:     Mental Status: She is alert.  Psychiatric:        Mood and Affect: Mood normal.        Behavior: Behavior normal.            Assessment & Plan:

## 2021-09-09 NOTE — Assessment & Plan Note (Signed)
On amiodarone 200 Metoprolol 50 eliquis 5 bid

## 2021-09-09 NOTE — Assessment & Plan Note (Signed)
AKI reversed with IV fluids Continue decreased diuretics

## 2021-09-09 NOTE — Assessment & Plan Note (Signed)
Weight way down now Torsemide decreased to '40mg'$  daily Change metolazone to only if weight over 280#

## 2021-09-09 NOTE — Assessment & Plan Note (Signed)
Doing okay with oxycodone '5mg'$  4x/day generally Can change next prescription back to smaller size if she likes

## 2021-09-09 NOTE — Telephone Encounter (Signed)
Verbal orders given to [facility].

## 2021-09-09 NOTE — Telephone Encounter (Signed)
You can use any appt that has 30 min slot. He does not have a limit on the amount of CPEs and MCWs in a day. I looked at his schedule and there are multiple 30 min slots starting 12-12-21. Let me know if you need any help.

## 2021-09-09 NOTE — Telephone Encounter (Signed)
Dr Silvio Pate, Tina Pacheco was asked to schedule a wellness  visit with you in 3 months at the end of October. You dont have any openings until December 1,2023. Could you please let me know when to schedule her so I can inform her.

## 2021-09-09 NOTE — Assessment & Plan Note (Signed)
PDMP reviewed No concerns 

## 2021-09-09 NOTE — Assessment & Plan Note (Addendum)
Due to overdiuresis Will recheck labs Diuretics decreased Spironolactone and KCl as well

## 2021-09-09 NOTE — Assessment & Plan Note (Signed)
Also from diuresis Will recheck levels

## 2021-09-10 ENCOUNTER — Other Ambulatory Visit: Payer: Self-pay | Admitting: Adult Health

## 2021-09-10 ENCOUNTER — Other Ambulatory Visit: Payer: Self-pay | Admitting: Cardiovascular Disease

## 2021-09-10 DIAGNOSIS — I480 Paroxysmal atrial fibrillation: Secondary | ICD-10-CM

## 2021-09-10 DIAGNOSIS — I5032 Chronic diastolic (congestive) heart failure: Secondary | ICD-10-CM

## 2021-09-12 NOTE — Telephone Encounter (Signed)
Refill request

## 2021-09-12 NOTE — Telephone Encounter (Signed)
Prescription refill request for Eliquis received. Indication:Afib Last office visit:5/23 Scr:1.4 Age: 71 Weight:118.4 kg  Prescription refilled

## 2021-09-13 ENCOUNTER — Telehealth: Payer: Self-pay | Admitting: Internal Medicine

## 2021-09-13 MED ORDER — POTASSIUM CHLORIDE CRYS ER 20 MEQ PO TBCR: 20.0000 meq | EXTENDED_RELEASE_TABLET | Freq: Every day | ORAL | 3 refills | Status: DC

## 2021-09-13 NOTE — Telephone Encounter (Signed)
Joya Tina Pacheco called [facility] stated that patient declined physical therapy services. Call back number [phone]

## 2021-09-13 NOTE — Telephone Encounter (Signed)
  Encourage patient to contact the pharmacy for refills or they can request refills through [place]:  Please schedule appointment if longer than 1 year  NEXT APPOINTMENT DATE:  MEDICATION:potassium chloride SA (KLOR-CON M) 20 MEQ tablet  Is the patient out of medication?   PHARMACY:[facility] [place], [place] -  Let patient know to contact pharmacy at the end of the day to make sure medication is ready.  Please notify patient to allow 48-72 hours to process  CLINICAL FILLS OUT ALL BELOW:   LAST REFILL:  QTY:  REFILL DATE:    OTHER COMMENTS:    Okay for refill?  Please advise

## 2021-09-13 NOTE — Telephone Encounter (Signed)
Rx sent electronically.  

## 2021-09-14 NOTE — Telephone Encounter (Signed)
Noted  

## 2021-09-15 DIAGNOSIS — I5032 Chronic diastolic (congestive) heart failure: Secondary | ICD-10-CM | POA: Diagnosis not present

## 2021-09-15 DIAGNOSIS — M1030 Gout due to renal impairment, unspecified site: Secondary | ICD-10-CM | POA: Diagnosis not present

## 2021-09-15 DIAGNOSIS — N1832 Chronic kidney disease, stage 3b: Secondary | ICD-10-CM | POA: Diagnosis not present

## 2021-09-15 DIAGNOSIS — D631 Anemia in chronic kidney disease: Secondary | ICD-10-CM | POA: Diagnosis not present

## 2021-09-15 DIAGNOSIS — N179 Acute kidney failure, unspecified: Secondary | ICD-10-CM | POA: Diagnosis not present

## 2021-09-15 DIAGNOSIS — I130 Hypertensive heart and chronic kidney disease with heart failure and stage 1 through stage 4 chronic kidney disease, or unspecified chronic kidney disease: Secondary | ICD-10-CM | POA: Diagnosis not present

## 2021-09-19 ENCOUNTER — Encounter: Payer: Self-pay | Admitting: Internal Medicine

## 2021-09-20 NOTE — Telephone Encounter (Signed)
Form placed in Dr Alla German inbox.

## 2021-09-22 DIAGNOSIS — M1030 Gout due to renal impairment, unspecified site: Secondary | ICD-10-CM | POA: Diagnosis not present

## 2021-09-22 DIAGNOSIS — I130 Hypertensive heart and chronic kidney disease with heart failure and stage 1 through stage 4 chronic kidney disease, or unspecified chronic kidney disease: Secondary | ICD-10-CM | POA: Diagnosis not present

## 2021-09-22 DIAGNOSIS — N179 Acute kidney failure, unspecified: Secondary | ICD-10-CM | POA: Diagnosis not present

## 2021-09-22 DIAGNOSIS — D631 Anemia in chronic kidney disease: Secondary | ICD-10-CM | POA: Diagnosis not present

## 2021-09-22 DIAGNOSIS — I5032 Chronic diastolic (congestive) heart failure: Secondary | ICD-10-CM | POA: Diagnosis not present

## 2021-09-22 DIAGNOSIS — N1832 Chronic kidney disease, stage 3b: Secondary | ICD-10-CM | POA: Diagnosis not present

## 2021-09-26 ENCOUNTER — Ambulatory Visit: Payer: Medicare Other | Admitting: Internal Medicine

## 2021-09-29 ENCOUNTER — Telehealth: Payer: Medicare Other

## 2021-09-29 DIAGNOSIS — D631 Anemia in chronic kidney disease: Secondary | ICD-10-CM | POA: Diagnosis not present

## 2021-09-29 DIAGNOSIS — N179 Acute kidney failure, unspecified: Secondary | ICD-10-CM | POA: Diagnosis not present

## 2021-09-29 DIAGNOSIS — I5032 Chronic diastolic (congestive) heart failure: Secondary | ICD-10-CM | POA: Diagnosis not present

## 2021-09-29 DIAGNOSIS — M0579 Rheumatoid arthritis with rheumatoid factor of multiple sites without organ or systems involvement: Secondary | ICD-10-CM | POA: Diagnosis not present

## 2021-09-29 DIAGNOSIS — N1832 Chronic kidney disease, stage 3b: Secondary | ICD-10-CM | POA: Diagnosis not present

## 2021-09-29 DIAGNOSIS — M1030 Gout due to renal impairment, unspecified site: Secondary | ICD-10-CM | POA: Diagnosis not present

## 2021-09-29 DIAGNOSIS — I130 Hypertensive heart and chronic kidney disease with heart failure and stage 1 through stage 4 chronic kidney disease, or unspecified chronic kidney disease: Secondary | ICD-10-CM | POA: Diagnosis not present

## 2021-09-30 ENCOUNTER — Other Ambulatory Visit: Payer: Self-pay | Admitting: Internal Medicine

## 2021-09-30 MED ORDER — OXYCODONE HCL 20 MG PO TABS: 1.0000 | ORAL_TABLET | Freq: Two times a day (BID) | ORAL | 0 refills | Status: DC | PRN

## 2021-09-30 NOTE — Telephone Encounter (Signed)
  Encourage patient to contact the pharmacy for refills or they can request refills through [facility]  Did the patient contact the pharmacy: No   LAST APPOINTMENT DATE: 09/09/21  NEXT APPOINTMENT DATE: 12/20/21  MEDICATION: Oxycodone '20mg'$   Is the patient out of medication? No  If not, how much is left? 2 pills   PHARMACY: [id number] - [place], [place]  Let patient know to contact pharmacy at the end of the day to make sure medication is ready.  Please notify patient to allow 48-72 hours to process

## 2021-09-30 NOTE — Telephone Encounter (Addendum)
Name of Medication: oxycodone 20 mg Name of Pharmacy: [facility] [address] Last Fill or Written Date and Quantity: # 58 on 08-28-21 Last Office Visit and Type: 09-09-21 Next Office Visit and Type: 12-20-21 Last Controlled Substance Agreement Date: 11/04/2020 Last UDS:04/20/21

## 2021-10-06 DIAGNOSIS — N1832 Chronic kidney disease, stage 3b: Secondary | ICD-10-CM | POA: Diagnosis not present

## 2021-10-06 DIAGNOSIS — I5032 Chronic diastolic (congestive) heart failure: Secondary | ICD-10-CM | POA: Diagnosis not present

## 2021-10-06 DIAGNOSIS — M1030 Gout due to renal impairment, unspecified site: Secondary | ICD-10-CM | POA: Diagnosis not present

## 2021-10-06 DIAGNOSIS — I130 Hypertensive heart and chronic kidney disease with heart failure and stage 1 through stage 4 chronic kidney disease, or unspecified chronic kidney disease: Secondary | ICD-10-CM | POA: Diagnosis not present

## 2021-10-06 DIAGNOSIS — D631 Anemia in chronic kidney disease: Secondary | ICD-10-CM | POA: Diagnosis not present

## 2021-10-06 DIAGNOSIS — N179 Acute kidney failure, unspecified: Secondary | ICD-10-CM | POA: Diagnosis not present

## 2021-10-07 ENCOUNTER — Telehealth: Payer: Self-pay | Admitting: Internal Medicine

## 2021-10-07 NOTE — Telephone Encounter (Signed)
Spoke to pt. She stated he had a stroke and Speech therapist came in today. He is having issues with swallowing. She states she gets phlegm in her throat and causes issues with swallowing, also. Asking if she can get a referral to [facility] for speech, also.

## 2021-10-07 NOTE — Telephone Encounter (Signed)
Spoke to pt. She will see when she can make an appt.

## 2021-10-07 NOTE — Telephone Encounter (Signed)
Patient is calling in to request a [facility] referral, same as husband as she is having difficulty with swallowing, same as her husband  Wants to speak to [facility] about this

## 2021-10-08 ENCOUNTER — Other Ambulatory Visit: Payer: Self-pay | Admitting: Cardiovascular Disease

## 2021-10-08 DIAGNOSIS — I5032 Chronic diastolic (congestive) heart failure: Secondary | ICD-10-CM | POA: Diagnosis not present

## 2021-10-08 DIAGNOSIS — I480 Paroxysmal atrial fibrillation: Secondary | ICD-10-CM

## 2021-10-08 DIAGNOSIS — E785 Hyperlipidemia, unspecified: Secondary | ICD-10-CM | POA: Diagnosis not present

## 2021-10-08 DIAGNOSIS — M1030 Gout due to renal impairment, unspecified site: Secondary | ICD-10-CM | POA: Diagnosis not present

## 2021-10-08 DIAGNOSIS — Z9181 History of falling: Secondary | ICD-10-CM | POA: Diagnosis not present

## 2021-10-08 DIAGNOSIS — L89892 Pressure ulcer of other site, stage 2: Secondary | ICD-10-CM | POA: Diagnosis not present

## 2021-10-08 DIAGNOSIS — M069 Rheumatoid arthritis, unspecified: Secondary | ICD-10-CM | POA: Diagnosis not present

## 2021-10-08 DIAGNOSIS — I130 Hypertensive heart and chronic kidney disease with heart failure and stage 1 through stage 4 chronic kidney disease, or unspecified chronic kidney disease: Secondary | ICD-10-CM | POA: Diagnosis not present

## 2021-10-08 DIAGNOSIS — D631 Anemia in chronic kidney disease: Secondary | ICD-10-CM | POA: Diagnosis not present

## 2021-10-08 DIAGNOSIS — N179 Acute kidney failure, unspecified: Secondary | ICD-10-CM | POA: Diagnosis not present

## 2021-10-08 DIAGNOSIS — M1990 Unspecified osteoarthritis, unspecified site: Secondary | ICD-10-CM | POA: Diagnosis not present

## 2021-10-08 DIAGNOSIS — Z91199 Patient's noncompliance with other medical treatment and regimen due to unspecified reason: Secondary | ICD-10-CM | POA: Diagnosis not present

## 2021-10-08 DIAGNOSIS — F40240 Claustrophobia: Secondary | ICD-10-CM | POA: Diagnosis not present

## 2021-10-08 DIAGNOSIS — N1832 Chronic kidney disease, stage 3b: Secondary | ICD-10-CM | POA: Diagnosis not present

## 2021-10-08 DIAGNOSIS — Z7901 Long term (current) use of anticoagulants: Secondary | ICD-10-CM | POA: Diagnosis not present

## 2021-10-08 DIAGNOSIS — Z79891 Long term (current) use of opiate analgesic: Secondary | ICD-10-CM | POA: Diagnosis not present

## 2021-10-08 DIAGNOSIS — G4730 Sleep apnea, unspecified: Secondary | ICD-10-CM | POA: Diagnosis not present

## 2021-10-08 DIAGNOSIS — Z4800 Encounter for change or removal of nonsurgical wound dressing: Secondary | ICD-10-CM | POA: Diagnosis not present

## 2021-10-08 DIAGNOSIS — G43909 Migraine, unspecified, not intractable, without status migrainosus: Secondary | ICD-10-CM | POA: Diagnosis not present

## 2021-10-08 DIAGNOSIS — G8929 Other chronic pain: Secondary | ICD-10-CM | POA: Diagnosis not present

## 2021-10-08 DIAGNOSIS — J449 Chronic obstructive pulmonary disease, unspecified: Secondary | ICD-10-CM | POA: Diagnosis not present

## 2021-10-08 DIAGNOSIS — Z6841 Body Mass Index (BMI) 40.0 and over, adult: Secondary | ICD-10-CM | POA: Diagnosis not present

## 2021-10-08 DIAGNOSIS — I2510 Atherosclerotic heart disease of native coronary artery without angina pectoris: Secondary | ICD-10-CM | POA: Diagnosis not present

## 2021-10-08 DIAGNOSIS — Z7952 Long term (current) use of systemic steroids: Secondary | ICD-10-CM | POA: Diagnosis not present

## 2021-10-10 ENCOUNTER — Other Ambulatory Visit: Payer: Self-pay | Admitting: *Deleted

## 2021-10-10 DIAGNOSIS — I480 Paroxysmal atrial fibrillation: Secondary | ICD-10-CM

## 2021-10-10 MED ORDER — AMIODARONE HCL 200 MG PO TABS: ORAL_TABLET | ORAL | 3 refills | Status: DC

## 2021-10-13 DIAGNOSIS — I5032 Chronic diastolic (congestive) heart failure: Secondary | ICD-10-CM | POA: Diagnosis not present

## 2021-10-13 DIAGNOSIS — M1030 Gout due to renal impairment, unspecified site: Secondary | ICD-10-CM | POA: Diagnosis not present

## 2021-10-13 DIAGNOSIS — N1832 Chronic kidney disease, stage 3b: Secondary | ICD-10-CM | POA: Diagnosis not present

## 2021-10-13 DIAGNOSIS — N179 Acute kidney failure, unspecified: Secondary | ICD-10-CM | POA: Diagnosis not present

## 2021-10-13 DIAGNOSIS — D631 Anemia in chronic kidney disease: Secondary | ICD-10-CM | POA: Diagnosis not present

## 2021-10-13 DIAGNOSIS — I130 Hypertensive heart and chronic kidney disease with heart failure and stage 1 through stage 4 chronic kidney disease, or unspecified chronic kidney disease: Secondary | ICD-10-CM | POA: Diagnosis not present

## 2021-10-14 ENCOUNTER — Ambulatory Visit: Payer: Self-pay

## 2021-10-14 NOTE — Patient Instructions (Signed)
Visit Information  Thank you for allowing me to share the care management and care coordination services that are available to you as part of your health plan and services through your primary care provider and medical home. Please reach out to your provider or  me at [phone] if the care management/care coordination team may be of assistance to you in the future.   Quinn Plowman RN,BSN,CCM RN Care Manager Coordinator [phone]

## 2021-10-14 NOTE — Chronic Care Management (AMB) (Signed)
Care Management    RN Visit Note  10/14/2021 Name: Tina Pacheco MRN: [id number] DOB: 10/19/1950  Subjective: Tina Pacheco is a 71 y.o. year old female who is a primary care patient of Venia Carbon, MD. The care management team was consulted for assistance with disease management and care coordination needs.    Engaged with patient by telephone for follow up visit in response to provider referral for case management and/or care coordination services.   Consent to Services:   [name] was given information about Care Management services today including:  Care Management services includes personalized support from designated clinical staff supervised by her physician, including individualized plan of care and coordination with other care providers 24/7 contact phone numbers for assistance for urgent and routine care needs. The patient may stop case management services at any time by phone call to the office staff.  Patient agreed to services and consent obtained.   Assessment: Review of patient past medical history, allergies, medications, health status, including review of consultants reports, laboratory and other test data, was performed as part of comprehensive evaluation and provision of chronic care management services.   SDOH (Social Determinants of Health) assessments and interventions performed:    Care Plan  Allergies  Allergen Reactions   Fluoxetine Other (See Comments)    Headache, shaking, sleep issues Headache, shaking, sleep issues   Phenergan [Promethazine] Other (See Comments)    Sleepiness   Codeine     Nausea and vomiting/only when taking too much    Outpatient Encounter Medications as of 10/14/2021  Medication Sig Note   amiodarone (PACERONE) 200 MG tablet TAKE 1 TABLET(200 MG) BY MOUTH DAILY 10/14/2021: Patient states she takes as needed.    atorvastatin (LIPITOR) 40 MG tablet TAKE 1 TABLET(40 MG) BY MOUTH DAILY    Certolizumab Pegol (CIMZIA [place])  Inject into the skin. Patient states taking 1 x per month    docusate sodium (COLACE) 100 MG capsule Take 1 capsule (100 mg total) by mouth 2 (two) times daily. 10/14/2021: Patient states is taking every other day.    ELIQUIS 5 MG TABS tablet TAKE 1 TABLET TWICE A DAY    ketoconazole (NIZORAL) 2 % cream APPLY A SMALL AMOUNT TO AFFECTED AREA ONCE A DAY TO RASH ON BUTTOCKS, GROIN    leflunomide (ARAVA) 20 MG tablet Take 1 tablet (20 mg total) by mouth daily.    metolazone (ZAROXOLYN) 2.5 MG tablet Take 1 tablet (2.5 mg total) by mouth daily as needed. If home weight is over 280#    metoprolol succinate (TOPROL-XL) 50 MG 24 hr tablet TAKE 1 TABLET BY MOUTH EVERY DAY WITH OR IMMEDIATELY FOLLOWING A MEAL    montelukast (SINGULAIR) 10 MG tablet TAKE 1 TABLET BY MOUTH AT BEDTIME    nitroGLYCERIN (NITROSTAT) 0.4 MG SL tablet Place 1 tablet (0.4 mg total) under the tongue every 5 (five) minutes as needed for chest pain.    nystatin (MYCOSTATIN/NYSTOP) powder Apply 1 application topically 3 (three) times daily as needed.    ondansetron (ZOFRAN-ODT) 4 MG disintegrating tablet Take 1 tablet (4 mg total) by mouth every 8 (eight) hours as needed for nausea or vomiting.    Oxycodone HCl 20 MG TABS Take 1 tablet (20 mg total) by mouth 2 (two) times daily as needed.    polyethylene glycol (MIRALAX / GLYCOLAX) packet Take 17 g by mouth daily as needed for mild constipation.    potassium chloride SA (KLOR-CON M) 20 MEQ tablet  Take 1 tablet (20 mEq total) by mouth daily.    predniSONE (DELTASONE) 5 MG tablet Take 5 mg by mouth daily with breakfast.    spironolactone (ALDACTONE) 25 MG tablet TAKE 1 TABLET(25 MG) BY MOUTH DAILY    tacrolimus (PROTOPIC) 0.1 % ointment Apply topically 2 (two) times daily. Apply to aa's lower legs as needed for rash    terazosin (HYTRIN) 10 MG capsule TAKE 1 CAPSULE(10 MG) BY MOUTH AT BEDTIME    torsemide (DEMADEX) 20 MG tablet Take 2 tablets (40 mg total) by mouth daily.    triamcinolone  ointment (KENALOG) 0.1 % Apply twice daily to affected areas of rash at legs. Avoid applying to face, groin, and axilla. Use as directed. Long-term use can cause thinning of the skin.    amiodarone (PACERONE) 200 MG tablet TAKE 1 TABLET(200 MG) BY MOUTH DAILY    silver sulfADIAZINE (SILVADENE) 1 % cream Apply 1 application. topically daily. Apply 1 gram to infected area once a day (Patient not taking: Reported on 10/14/2021)    No facility-administered encounter medications on file as of 10/14/2021.    Patient Active Problem List   Diagnosis Date Noted   Rhabdomyolysis 09/02/2021   Hypokalemia 09/02/2021   Sinus bradycardia 09/02/2021   Morbid obesity ([place]) 07/08/2021   Rectal bleeding 07/08/2021   Constipation 07/08/2021   Vomiting without nausea 07/08/2021   Skin rash 07/08/2021   Ulcers of both lower legs ([place]) 06/10/2021   Acute-on-chronic kidney injury ([place]) 04/21/2021   Dehydration 04/21/2021   Hypomagnesemia 04/21/2021   Recurrent falls 55/73/2202   Acute metabolic encephalopathy 54/27/0623   Tremor 03/23/2021   Sleep disturbance 03/23/2021   Nontraumatic complete tear of right rotator cuff 11/19/2020   Status post reverse total shoulder replacement, 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   Closed dislocation of right shoulder 11/10/2020   Dislocation of shoulder joint 11/10/2020   Stage 3b chronic kidney disease ([place]) 07/07/2020   Demand ischemia ([place])    Polymyalgia rheumatica ([place]) 02/03/2020   Mixed hyperlipidemia 04/04/2019   Rheumatoid arthritis, unspecified ([place]) 04/04/2019   Chronic diastolic CHF (congestive heart failure) (HCC)    COPD (chronic obstructive pulmonary disease) ([place]) 03/30/2019   Hypertensive heart disease with heart failure ([place]) 03/30/2019   Morbid (severe) obesity due to excess calories ([place]) 02/28/2019   Sleep apnea, unspecified 02/28/2019   Athscl heart disease of native coronary artery w/o ang pctrs 02/28/2019   Chronic obstructive pulmonary disease ([place])  02/28/2019   Gout, unspecified 02/28/2019   Urge incontinence of urine 12/23/2018   Left leg pain 09/18/2018   Chronic gouty arthropathy without tophi 04/01/2018   Preventative health care 03/20/2018   Mood disorder ([place]) 03/20/2018   Advance directive discussed with patient 03/20/2018   Lymphedema 11/26/2017   Nausea 05/08/2017   Narcotic dependence ([place]) 03/09/2017   Vertigo 06/28/2016   Bronchiectasis without acute exacerbation ([place]) 06/15/2015   DDD (degenerative disc disease), lumbosacral 03/30/2015   Sleep apnea    Atherosclerotic heart disease of native coronary artery with angina pectoris ([place]) 08/25/2014   Paroxysmal A-fib ([place]) 08/25/2014   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   RLS (restless legs syndrome) 07/06/2014   Gout 05/26/2014   Chronic pain syndrome 05/26/2014   Migraine 04/08/2014   Back pain 07/22/2013   Metatarsalgia of right foot 07/22/2013   Insomnia 07/15/2013   OA (osteoarthritis) 11/20/2012   DJD (degenerative joint disease) of cervical spine 09/10/2012   Allergic rhinitis 07/10/2011   Rheumatoid arthritis ([place]) 03/06/2011   Morbid  obesity with BMI of 50.0-59.9, adult ([place]) 06/22/2010   HLD (hyperlipidemia) 06/22/2010   HTN (hypertension) 06/22/2010   CAD (coronary artery disease) 06/22/2010   Long term current use of anticoagulant therapy 05/27/2010    Conditions to be addressed/monitored: Atrial Fibrillation, CHF, and Lymphedema  Care Plan : [facility] Plan of care  Updates made by Dannielle Karvonen, RN since 10/14/2021 12:00 AM     Problem: Chronic Disesease management, education and care coordination needs ( HF, A-fib, Lymphedema)   Priority: High     Long-Range Goal: Development of plan of care to address chronic disease management and care coordination needs. (HF, A-fib, Lymphedema)   Start Date: 01/10/2021  Expected End Date: 09/26/2021  Priority: High  Note:   Goals met. Case closed. Current Barriers:  Knowledge Deficits  related to plan of care for management of Atrial Fibrillation, CHF, and Lymphedema  Chronic Disease Management support and education needs related to Atrial Fibrillation, CHF, and Lymphedema  Patient reports her husband had a recent stroke but doing ok. She states she has family help for him.  Patient reports her leg wound is healing well. She states she continues to have a nurse with [employer] 1x per week for wound management.  Patient reports current weight is 261 lbs down from 313 lbs in May 2023. She states she is very happy about this.  Patient states she has occasional swelling with her lymphedema but overall doing much better.  Denies any shortness of breath or chest pain.  Patient states her daughter continues to fill her pill box weekly.    RNCM Clinical Goal(s):  Patient will verbalize basic understanding of Atrial Fibrillation, CHF, and Lymphedema  disease process and self health management plan as evidenced by the following  through collaboration with RN Care manager, provider, and care team:   -verbalize understanding of plan for management of Atrial Fibrillation, CHF, and Lymphedema -take all medications exactly as prescribed and will call provider for medication related questions -attend all scheduled medical appointments:  -continue to work with RN Care Manager to address care management and care coordination needs related to  Atrial Fibrillation, CHF, and Lymphedema Interventions: 1:1 collaboration with primary care provider regarding development and update of comprehensive plan of care as evidenced by provider attestation and co-signature Inter-disciplinary care team collaboration (see longitudinal plan of care) Evaluation of current treatment plan related to  self management and patient's adherence to plan as established by provider   Lymphedema Interventions:  Goal met Evaluation of current treatment plan related to Lymphedema and patient's adherence to plan as established by  provider; Reviewed medications with patient and discussed  Reviewed scheduled/upcoming provider appointments  Discussed plans with patient for ongoing care management follow up and provided patient with direct contact information for care management team; Assessed for falls Patient advised to elevate legs when sitting, Advised to avoid salt and be as active as possible.  Heart Failure Interventions: Goal met Provided education on low sodium diet; Discussed importance of daily weight and advised patient to weigh and record daily; Discussed the importance of keeping all appointments with provider; Encouraged patient to decrease salt intake Patient advised to contact her doctor if no improvement in weight/ heart failure symptoms after taking extra fluid pill.   AFIB Interventions:Goal met Reviewed importance of adherence to anticoagulant exactly as prescribed Assessed for atrial fibrillation symptoms.  Advised to notify doctor for mild/ moderate symptoms and call 911 for severe symptoms.  Patient Goals/Self-Care Activities: Continue to take medications as  prescribed and refill timely Attend scheduled provider appointments  Continue to weight daily and record. Notify provider for weight gain of 3 lbs overnight and 5 lbs in a week.  Check  (heart) rate once a day and record Call your doctor for new, ongoing or worsening symptoms.  Monitor blood pressure at least 1-2 times per week.  Notify provider for blood pressure concerns.  Continue to follow a low salt diet Elevate legs when sitting Contact your vascular doctor if you do not hear back regarding your Lymphedema pumps Continue to follow fall prevention strategies:  Use your assistive device ( cane or walker) if advised by your doctor, Make surer walkways are clear of clutter, cords, and throw rugs,  Make sure there is good lighting throughout your home.       Plan: No further follow up required: Goals met. Case closed  Quinn Plowman  RN,BSN,CCM [place] Coordinator [phone]

## 2021-10-21 DIAGNOSIS — I130 Hypertensive heart and chronic kidney disease with heart failure and stage 1 through stage 4 chronic kidney disease, or unspecified chronic kidney disease: Secondary | ICD-10-CM | POA: Diagnosis not present

## 2021-10-21 DIAGNOSIS — I5032 Chronic diastolic (congestive) heart failure: Secondary | ICD-10-CM | POA: Diagnosis not present

## 2021-10-21 DIAGNOSIS — M1030 Gout due to renal impairment, unspecified site: Secondary | ICD-10-CM | POA: Diagnosis not present

## 2021-10-21 DIAGNOSIS — D631 Anemia in chronic kidney disease: Secondary | ICD-10-CM | POA: Diagnosis not present

## 2021-10-21 DIAGNOSIS — N1832 Chronic kidney disease, stage 3b: Secondary | ICD-10-CM | POA: Diagnosis not present

## 2021-10-21 DIAGNOSIS — N179 Acute kidney failure, unspecified: Secondary | ICD-10-CM | POA: Diagnosis not present

## 2021-10-27 ENCOUNTER — Other Ambulatory Visit: Payer: Self-pay | Admitting: Internal Medicine

## 2021-10-27 DIAGNOSIS — M0579 Rheumatoid arthritis with rheumatoid factor of multiple sites without organ or systems involvement: Secondary | ICD-10-CM | POA: Diagnosis not present

## 2021-10-27 MED ORDER — OXYCODONE HCL 20 MG PO TABS: 1.0000 | ORAL_TABLET | Freq: Two times a day (BID) | ORAL | 0 refills | Status: DC | PRN

## 2021-10-27 NOTE — Telephone Encounter (Signed)
Caller Name: [name] Call back phone #: [phone]  MEDICATION(S): Oxycodone HCl 20 MG TABS  Days of Med Remaining: 1  Has the patient contacted their pharmacy (YES/NO)? NO What did pharmacy advise?   Preferred Pharmacy:  [place]   ~~~Please advise patient/caregiver to allow 2-3 business days to process RX refills.

## 2021-10-27 NOTE — Telephone Encounter (Signed)
Name of Medication: oxycodone 20 mg Name of Pharmacy: [facility] [address] Last Fill or Written Date and Quantity: # 35 on 09-30-21 Last Office Visit and Type: 09-09-21 Next Office Visit and Type: 12-20-21 Last Controlled Substance Agreement Date: 11/04/2020 Last UDS:04/20/21

## 2021-10-28 DIAGNOSIS — M1030 Gout due to renal impairment, unspecified site: Secondary | ICD-10-CM | POA: Diagnosis not present

## 2021-10-28 DIAGNOSIS — D631 Anemia in chronic kidney disease: Secondary | ICD-10-CM | POA: Diagnosis not present

## 2021-10-28 DIAGNOSIS — N179 Acute kidney failure, unspecified: Secondary | ICD-10-CM | POA: Diagnosis not present

## 2021-10-28 DIAGNOSIS — I130 Hypertensive heart and chronic kidney disease with heart failure and stage 1 through stage 4 chronic kidney disease, or unspecified chronic kidney disease: Secondary | ICD-10-CM | POA: Diagnosis not present

## 2021-10-28 DIAGNOSIS — I5032 Chronic diastolic (congestive) heart failure: Secondary | ICD-10-CM | POA: Diagnosis not present

## 2021-10-28 DIAGNOSIS — N1832 Chronic kidney disease, stage 3b: Secondary | ICD-10-CM | POA: Diagnosis not present

## 2021-11-04 DIAGNOSIS — I5032 Chronic diastolic (congestive) heart failure: Secondary | ICD-10-CM | POA: Diagnosis not present

## 2021-11-04 DIAGNOSIS — N179 Acute kidney failure, unspecified: Secondary | ICD-10-CM | POA: Diagnosis not present

## 2021-11-04 DIAGNOSIS — D631 Anemia in chronic kidney disease: Secondary | ICD-10-CM | POA: Diagnosis not present

## 2021-11-04 DIAGNOSIS — I130 Hypertensive heart and chronic kidney disease with heart failure and stage 1 through stage 4 chronic kidney disease, or unspecified chronic kidney disease: Secondary | ICD-10-CM | POA: Diagnosis not present

## 2021-11-04 DIAGNOSIS — M1030 Gout due to renal impairment, unspecified site: Secondary | ICD-10-CM | POA: Diagnosis not present

## 2021-11-04 DIAGNOSIS — N1832 Chronic kidney disease, stage 3b: Secondary | ICD-10-CM | POA: Diagnosis not present

## 2021-11-07 ENCOUNTER — Encounter: Payer: Self-pay | Admitting: Internal Medicine

## 2021-11-24 ENCOUNTER — Other Ambulatory Visit: Payer: Self-pay | Admitting: Internal Medicine

## 2021-11-24 MED ORDER — OXYCODONE HCL 20 MG PO TABS: 1.0000 | ORAL_TABLET | Freq: Two times a day (BID) | ORAL | 0 refills | Status: DC | PRN

## 2021-11-24 NOTE — Telephone Encounter (Signed)
Name of Medication: oxycodone 20 mg Name of Pharmacy: [facility] [address] Last Fill or Written Date and Quantity: 10-27-21 #60 Last Office Visit and Type: 09-09-21 Next Office Visit and Type: 12-20-21 Last Controlled Substance Agreement Date: 11/04/2020 Last UDS:04/20/21

## 2021-11-24 NOTE — Telephone Encounter (Signed)
Caller Name: mellie buccellato  Call back phone #: [phone]  MEDICATION(S):  Oxycodone HCl 20 MG TABS  Days of Med Remaining: 1  Has the patient contacted their pharmacy (YES/NO)? NO What did pharmacy advise?   Preferred Pharmacy:  [facility], [address]   ~~~Please advise patient/caregiver to allow 2-3 business days to process RX refills.

## 2021-12-07 DIAGNOSIS — M0579 Rheumatoid arthritis with rheumatoid factor of multiple sites without organ or systems involvement: Secondary | ICD-10-CM | POA: Diagnosis not present

## 2021-12-11 ENCOUNTER — Other Ambulatory Visit: Payer: Self-pay | Admitting: Internal Medicine

## 2021-12-20 ENCOUNTER — Ambulatory Visit: Payer: Medicare Other | Admitting: Internal Medicine

## 2021-12-23 ENCOUNTER — Other Ambulatory Visit: Payer: Self-pay | Admitting: Internal Medicine

## 2021-12-23 MED ORDER — OXYCODONE HCL 20 MG PO TABS: 1.0000 | ORAL_TABLET | Freq: Two times a day (BID) | ORAL | 0 refills | Status: DC | PRN

## 2021-12-23 NOTE — Telephone Encounter (Signed)
Name of Medication: oxycodone 20 mg Name of Pharmacy: [facility] [address] Last Fill or Written Date and Quantity: 9/28-23 #60 Last Office Visit and Type: 09-09-21 Next Office Visit and Type: 12-27-21 Last Controlled Substance Agreement Date: 11/04/2020 Last UDS:04/20/21

## 2021-12-23 NOTE — Telephone Encounter (Signed)
  Encourage patient to contact the pharmacy for refills or they can request refills through [facility]  Did the patient contact the pharmacy:  NO   LAST APPOINTMENT DATE:  09/09/21  NEXT APPOINTMENT DATE:12/27/21  MEDICATION:Oxycodone HCl 20 MG TABS  Is the patient out of medication? NO  If not, how much is left?2 PILLS LEFT  Is this a 90 day supply: [place], [place] Phone:  [phone]  Fax:  [phone]    :  Let patient know to contact pharmacy at the end of the day to make sure medication is ready.  Please notify patient to allow 48-72 hours to process

## 2021-12-26 ENCOUNTER — Encounter (INDEPENDENT_AMBULATORY_CARE_PROVIDER_SITE_OTHER): Payer: Self-pay

## 2021-12-27 ENCOUNTER — Encounter: Payer: Self-pay | Admitting: Internal Medicine

## 2021-12-27 ENCOUNTER — Telehealth: Payer: Self-pay | Admitting: Internal Medicine

## 2021-12-27 ENCOUNTER — Ambulatory Visit (INDEPENDENT_AMBULATORY_CARE_PROVIDER_SITE_OTHER): Payer: Medicare Other | Admitting: Internal Medicine

## 2021-12-27 VITALS — BP 122/70 | HR 55 | Temp 98.0°F | Ht 63.0 in | Wt 252.0 lb

## 2021-12-27 DIAGNOSIS — I25119 Atherosclerotic heart disease of native coronary artery with unspecified angina pectoris: Secondary | ICD-10-CM | POA: Diagnosis not present

## 2021-12-27 DIAGNOSIS — I480 Paroxysmal atrial fibrillation: Secondary | ICD-10-CM

## 2021-12-27 DIAGNOSIS — I2510 Atherosclerotic heart disease of native coronary artery without angina pectoris: Secondary | ICD-10-CM

## 2021-12-27 DIAGNOSIS — M069 Rheumatoid arthritis, unspecified: Secondary | ICD-10-CM

## 2021-12-27 DIAGNOSIS — N2581 Secondary hyperparathyroidism of renal origin: Secondary | ICD-10-CM | POA: Diagnosis not present

## 2021-12-27 DIAGNOSIS — N1832 Chronic kidney disease, stage 3b: Secondary | ICD-10-CM | POA: Diagnosis not present

## 2021-12-27 DIAGNOSIS — J449 Chronic obstructive pulmonary disease, unspecified: Secondary | ICD-10-CM

## 2021-12-27 DIAGNOSIS — Z0000 Encounter for general adult medical examination without abnormal findings: Secondary | ICD-10-CM

## 2021-12-27 DIAGNOSIS — Z23 Encounter for immunization: Secondary | ICD-10-CM | POA: Diagnosis not present

## 2021-12-27 DIAGNOSIS — F39 Unspecified mood [affective] disorder: Secondary | ICD-10-CM

## 2021-12-27 DIAGNOSIS — G894 Chronic pain syndrome: Secondary | ICD-10-CM | POA: Diagnosis not present

## 2021-12-27 DIAGNOSIS — M353 Polymyalgia rheumatica: Secondary | ICD-10-CM | POA: Diagnosis not present

## 2021-12-27 LAB — RENAL FUNCTION PANEL
Albumin: 3.9 g/dL (ref 3.5–5.2)
BUN: 33 mg/dL — ABNORMAL HIGH (ref 6–23)
CO2: 31 mEq/L (ref 19–32)
Calcium: 10.6 mg/dL — ABNORMAL HIGH (ref 8.4–10.5)
Chloride: 99 mEq/L (ref 96–112)
Creatinine, Ser: 1.33 mg/dL — ABNORMAL HIGH (ref 0.40–1.20)
GFR: 40.14 mL/min — ABNORMAL LOW (ref >60.00)
Glucose, Bld: 100 mg/dL — ABNORMAL HIGH (ref 70–99)
Phosphorus: 2.4 mg/dL (ref 2.3–4.6)
Potassium: 3.1 mEq/L — ABNORMAL LOW (ref 3.5–5.1)
Sodium: 138 mEq/L (ref 135–145)

## 2021-12-27 LAB — CBC
HCT: 35.5 % — ABNORMAL LOW (ref 36.0–46.0)
Hemoglobin: 11.6 g/dL — ABNORMAL LOW (ref 12.0–15.0)
MCHC: 32.6 g/dL (ref 30.0–36.0)
MCV: 91.9 fl (ref 78.0–100.0)
Platelets: 179 10*3/uL (ref 150.0–400.0)
RBC: 3.86 Mil/uL — ABNORMAL LOW (ref 3.87–5.11)
RDW: 17.4 % — ABNORMAL HIGH (ref 11.5–15.5)
WBC: 6.7 10*3/uL (ref 4.0–10.5)

## 2021-12-27 LAB — HEPATIC FUNCTION PANEL
ALT: 24 U/L (ref 0–35)
AST: 32 U/L (ref 0–37)
Albumin: 3.9 g/dL (ref 3.5–5.2)
Alkaline Phosphatase: 103 U/L (ref 39–117)
Bilirubin, Direct: 0.2 mg/dL (ref 0.0–0.3)
Total Bilirubin: 0.8 mg/dL (ref 0.2–1.2)
Total Protein: 6.6 g/dL (ref 6.0–8.3)

## 2021-12-27 LAB — T4, FREE: Free T4: 1 ng/dL (ref 0.60–1.60)

## 2021-12-27 LAB — LIPID PANEL
Cholesterol: 149 mg/dL (ref 0–200)
HDL: 46.6 mg/dL (ref >39.00)
LDL Cholesterol: 71 mg/dL (ref 0–99)
NonHDL: 102.6
Total CHOL/HDL Ratio: 3
Triglycerides: 160 mg/dL — ABNORMAL HIGH (ref 0.0–149.0)
VLDL: 32 mg/dL (ref 0.0–40.0)

## 2021-12-27 LAB — VITAMIN D 25 HYDROXY (VIT D DEFICIENCY, FRACTURES): VITD: 21.3 ng/mL — ABNORMAL LOW (ref 30.00–100.00)

## 2021-12-27 LAB — TSH: TSH: 2.48 u[IU]/mL (ref 0.35–5.50)

## 2021-12-27 NOTE — Patient Instructions (Signed)
Please add senna-s 2 tabs daily along with the miralax. You may only need the miralax once a day then.

## 2021-12-27 NOTE — Assessment & Plan Note (Signed)
Limited exercise tolerance No Rx

## 2021-12-27 NOTE — Assessment & Plan Note (Signed)
Mostly reactive depression

## 2021-12-27 NOTE — Assessment & Plan Note (Signed)
Will recheck labs 

## 2021-12-27 NOTE — Assessment & Plan Note (Signed)
I have personally reviewed the Medicare Annual Wellness questionnaire and have noted 1. The patient's medical and social history 2. Their use of alcohol, tobacco or illicit drugs 3. Their current medications and supplements 4. The patient's functional ability including ADL's, fall risks, home safety risks and hearing or visual             impairment. 5. Diet and physical activities 6. Evidence for depression or mood disorders  The patients weight, height, BMI and visual acuity have been recorded in the chart I have made referrals, counseling and provided education to the patient based review of the above and I have provided the pt with a written personalized care plan for preventive services.  I have provided you with a copy of your personalized plan for preventive services. Please take the time to review along with your updated medication list.  Colon due 2025 Needs to set up screening mammogram No pap due to age Flu vaccine today Prefers no more COVID vaccines Td at pharmacy Consider shingrix at pharmacy

## 2021-12-27 NOTE — Progress Notes (Signed)
Vision Screening   Right eye Left eye Both eyes  Without correction '20/20 20/25 20/15 '$  With correction     Hearing Screening - Comments:: Passed whisper test

## 2021-12-27 NOTE — Assessment & Plan Note (Signed)
On prednisone 5

## 2021-12-27 NOTE — Assessment & Plan Note (Addendum)
Quiet on the arava and low dose prednisone and cimzia

## 2021-12-27 NOTE — Assessment & Plan Note (Signed)
Easy DOE still Metoprolol 50, atorvastatin 40 daily On eliquis

## 2021-12-27 NOTE — Progress Notes (Signed)
Subjective:    Patient ID: Tina Pacheco, female    DOB: October 26, 1950, 71 y.o.   MRN: [id number]  HPI Here with daughter for Medicare wellness visit and follow up of chronic health conditions Reviewed advanced directives Reviewed other doctors---Dr Rexene Alberts, Dr Patel--rheumatology, Dr Dew--vascular, Dr Jasper Riling, Dr Raynelle Highland, Dr Anthonette Legato, Dr Gollan--cardiology Hospitalized for encephalopathy in February, then AKI in July No surgery Vision is okay Mild hearing loss No alcohol No tobacco Fell once--no injury Chronic mood issues Doesn't drive. No shopping. Only does some light housework--laundry, dishwasher, light cooking No concerning memory issues  Legs are weaker Trouble getting into daughter's SUV Walks with walker--but very limited Can't do stair Pain control is reasonable Did have PT after hospital stays Has not been good with home exercise program No muscle pain to suggest PMR is active  Husband had stroke He is now having rehab Stress in home due to this--burden on daughter  RA controlled with arava Some shaking--may be related to low potassium  No chest pain Some SOB upon awakening--has to focus on breathing Daughter notes apnea--and he head falls forward in recliner where she sleeps No palpitations No dizziness or syncope  Mood not great Daily mild depression  Last GFR 38 BMI much better but still 44  Current Outpatient Medications on File Prior to Visit  Medication Sig Dispense Refill   amiodarone (PACERONE) 200 MG tablet TAKE 1 TABLET(200 MG) BY MOUTH DAILY 30 tablet 3   atorvastatin (LIPITOR) 40 MG tablet TAKE 1 TABLET(40 MG) BY MOUTH DAILY 90 tablet 3   Certolizumab Pegol (CIMZIA [place]) Inject into the skin. Patient states taking 1 x per month     docusate sodium (COLACE) 100 MG capsule Take 1 capsule (100 mg total) by mouth 2 (two) times daily. 60 capsule 2   ELIQUIS 5 MG TABS tablet TAKE 1 TABLET TWICE A DAY 180 tablet 1    ketoconazole (NIZORAL) 2 % cream APPLY A SMALL AMOUNT TO AFFECTED AREA ONCE A DAY TO RASH ON BUTTOCKS, GROIN 60 g 1   leflunomide (ARAVA) 20 MG tablet Take 1 tablet (20 mg total) by mouth daily. 30 tablet 0   metolazone (ZAROXOLYN) 2.5 MG tablet Take 1 tablet (2.5 mg total) by mouth daily as needed. If home weight is over 280# 1 tablet 0   metoprolol succinate (TOPROL-XL) 50 MG 24 hr tablet TAKE 1 TABLET BY MOUTH EVERY DAY WITH OR IMMEDIATELY FOLLOWING A MEAL 90 tablet 3   montelukast (SINGULAIR) 10 MG tablet TAKE 1 TABLET BY MOUTH AT BEDTIME 90 tablet 3   nitroGLYCERIN (NITROSTAT) 0.4 MG SL tablet Place 1 tablet (0.4 mg total) under the tongue every 5 (five) minutes as needed for chest pain. 20 tablet 12   nystatin (MYCOSTATIN/NYSTOP) powder Apply 1 application topically 3 (three) times daily as needed. 60 g 0   ondansetron (ZOFRAN-ODT) 4 MG disintegrating tablet Take 1 tablet (4 mg total) by mouth every 8 (eight) hours as needed for nausea or vomiting. 30 tablet 1   Oxycodone HCl 20 MG TABS Take 1 tablet (20 mg total) by mouth 2 (two) times daily as needed. 60 tablet 0   polyethylene glycol (MIRALAX / GLYCOLAX) packet Take 17 g by mouth daily as needed for mild constipation. 14 each 0   potassium chloride SA (KLOR-CON M) 20 MEQ tablet Take 1 tablet (20 mEq total) by mouth daily. 90 tablet 3   predniSONE (DELTASONE) 5 MG tablet Take 5 mg by mouth daily with breakfast.  silver sulfADIAZINE (SILVADENE) 1 % cream Apply 1 application. topically daily. Apply 1 gram to infected area once a day 50 g 0   spironolactone (ALDACTONE) 25 MG tablet TAKE 1 TABLET(25 MG) BY MOUTH DAILY 30 tablet 11   tacrolimus (PROTOPIC) 0.1 % ointment Apply topically 2 (two) times daily. Apply to aa's lower legs as needed for rash 100 g 3   terazosin (HYTRIN) 10 MG capsule TAKE 1 CAPSULE(10 MG) BY MOUTH AT BEDTIME 90 capsule 3   torsemide (DEMADEX) 20 MG tablet Take 2 tablets (40 mg total) by mouth daily. 1 tablet 0    triamcinolone ointment (KENALOG) 0.1 % Apply twice daily to affected areas of rash at legs. Avoid applying to face, groin, and axilla. Use as directed. Long-term use can cause thinning of the skin. 454 g 0   No current facility-administered medications on file prior to visit.    Allergies  Allergen Reactions   Fluoxetine Other (See Comments)    Headache, shaking, sleep issues Headache, shaking, sleep issues   Phenergan [Promethazine] Other (See Comments)    Sleepiness   Codeine     Nausea and vomiting/only when taking too much    Past Medical History:  Diagnosis Date   Anemia    Arthritis    RA   Asthma    Basal cell carcinoma    Cataract 2018   bilateral eyes; corrected with surgery   Chronic diastolic CHF (congestive heart failure) (HCC)    Claustrophobia    Collagen vascular disease (HCC)    RA   COPD (chronic obstructive pulmonary disease) (HCC)    Diastolic dysfunction    a. echo 07/2014: EF 55-60%, no RWMA, GR2DD, mild MR, LA moderately dilated, PASP 38 mm Hg   Dyslipidemia    Headache    migraines   Hemihypertrophy    History of cardiac cath    a. 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 on Pradaxa; b. CHADSVASc at least 2 (HTN & female)   Rheumatoid arthritis(714.0)    Sleep apnea    a. not compliant with CPAP   Stage 3 chronic kidney disease ([place])     Past Surgical History:  Procedure Laterality Date   ABDOMINAL HYSTERECTOMY     APPLICATION OF WOUND VAC Right 08/09/2017   Procedure: APPLICATION OF WOUND VAC;  Surgeon: Albertine Patricia, DPM;  Location: [facility];  Service: Podiatry;  Laterality: Right;   CARDIAC CATHETERIZATION  05/24/2010   nonobstructive CAD   CATARACT EXTRACTION W/ INTRAOCULAR LENS IMPLANT Right 06/12/2016   Dr. Darleen Crocker   CATARACT EXTRACTION W/ INTRAOCULAR LENS IMPLANT  Left 06/26/2016   Dr. Darleen Crocker   CESAREAN SECTION     CHOLECYSTECTOMY     COLONOSCOPY  06/12/2011   Procedure: COLONOSCOPY;  Surgeon: Juanita Craver, MD;  Location: [facility];  Service: Endoscopy;  Laterality: N/A;   COLONOSCOPY N/A 03/17/2013   Procedure: COLONOSCOPY;  Surgeon: Juanita Craver, MD;  Location: [facility];  Service: Endoscopy;  Laterality: N/A;   EYE SURGERY     FOOT ARTHRODESIS Right 07/13/2017   Procedure: ARTHRODESIS FOOT-MULTI.FUSIONS (6 JOINTS);  Surgeon: Albertine Patricia, DPM;  Location: [facility];  Service: Podiatry;  Laterality: Right;   IRRIGATION AND DEBRIDEMENT FOOT Right 08/09/2017   Procedure: IRRIGATION AND DEBRIDEMENT FOOT;  Surgeon:  Albertine Patricia, DPM;  Location: [facility];  Service: Podiatry;  Laterality: Right;   KNEE ARTHROSCOPY     bilateral   LEFT HEART CATH AND CORONARY ANGIOGRAPHY N/A 06/04/2020   Procedure: LEFT HEART CATH AND CORONARY ANGIOGRAPHY;  Surgeon: Wellington Hampshire, MD;  Location: [facility];  Service: Cardiovascular;  Laterality: N/A;   REVERSE SHOULDER ARTHROPLASTY Right 11/16/2020   Procedure: REVERSE SHOULDER ARTHROPLASTY;  Surgeon: Corky Mull, MD;  Location: [facility];  Service: Orthopedics;  Laterality: Right;   SHOULDER CLOSED REDUCTION Right 11/11/2020   Procedure: CLOSED REDUCTION SHOULDER;  Surgeon: Corky Mull, MD;  Location: [facility];  Service: Orthopedics;  Laterality: Right;   SHOULDER INJECTION Right 11/11/2020   Procedure: SHOULDER INJECTION;  Surgeon: Corky Mull, MD;  Location: [facility];  Service: Orthopedics;  Laterality: Right;   SINUSOTOMY     TOOTH EXTRACTION  12/2016   VAGINAL HYSTERECTOMY      Family History  Problem Relation Age of Onset   Tuberculosis Mother    Parkinsonism Mother    Emphysema Father        smoked   Arthritis Father    Cancer Sister    Diabetes type II Sister    Breast cancer Sister    Tuberculosis Sister    Breast cancer Maternal Aunt    Colon cancer Neg Hx    Esophageal  cancer Neg Hx    Pancreatic cancer Neg Hx    Stomach cancer Neg Hx     Social History   Socioeconomic History   Marital status: Married    Spouse name: Not on file   Number of children: 1   Years of education: Not on file   Highest education level: Not on file  Occupational History   Occupation: Furniture conservator/restorer: [employer]    Comment: Retired 2007  Tobacco Use   Smoking status: Never    Passive exposure: Current   Smokeless tobacco: Never  Vaping Use   Vaping Use: Never used  Substance and Sexual Activity   Alcohol use: No   Drug use: No   Sexual activity: Not Currently  Other Topics Concern   Not on file  Social History Narrative   No living will   Daughter should be decision maker   Would accept resuscitation attempts--but no prolonged ventilator or tube feeds   Social Determinants of Health   Financial Resource Strain: Not on file  Food Insecurity: No Food Insecurity (07/08/2020)   Hunger Vital Sign    Worried About Running Out of Food in the Last Year: Never true    Ran Out of Food in the Last Year: Never true  Transportation Needs: No Transportation Needs (07/08/2020)   PRAPARE - Hydrologist (Medical): No    Lack of Transportation (Non-Medical): No  Physical Activity: Not on file  Stress: Not on file  Social Connections: Not on file  Intimate Partner Violence: Not on file   Review of Systems Appetite is okay Has lost tremendous amount of weight--now stabilizing Not sleeping well Needs dental work---needs new dentist Easy bruising--from eliquis No suspicious skin lesions Some heartburn--using pepto bismol. Some constipation--using miralax with small stools  SOme incontinence when loose Voids okay  Objective:   Physical Exam Constitutional:      Appearance: Normal appearance.     Comments: In wheelchair Doesn't get on table  HENT:     Mouth/Throat:     Comments: No lesions  Eyes:     Conjunctiva/sclera:  Conjunctivae normal.     Pupils: Pupils are equal, round, and reactive to light.  Cardiovascular:     Rate and Rhythm: Regular rhythm. Bradycardia present.     Heart sounds: No murmur heard.    No gallop.     Comments: Feet warm but no palpable pulses Pulmonary:     Effort: Pulmonary effort is normal.     Breath sounds: No wheezing or rales.     Comments: Decreased breath sounds but clear Abdominal:     Palpations: Abdomen is soft.     Tenderness: There is no abdominal tenderness.  Musculoskeletal:     Cervical back: Neck supple.     Right lower leg: No edema.     Left lower leg: No edema.  Lymphadenopathy:     Cervical: No cervical adenopathy.  Skin:    Findings: No lesion or rash.  Neurological:     General: No focal deficit present.     Mental Status: She is alert and oriented to person, place, and time.     Comments: Mini-cog normal  Psychiatric:        Mood and Affect: Mood normal.        Behavior: Behavior normal.            Assessment & Plan:

## 2021-12-27 NOTE — Assessment & Plan Note (Signed)
Regular and slow on amiodarone 200 eliquis 5 bid

## 2021-12-27 NOTE — Telephone Encounter (Signed)
Abigail Butts from [employer] need some more information on certification dates. She is needing this information by today or the patient claim will be denied. Patient policy number is [id number]. Thank you!

## 2021-12-27 NOTE — Assessment & Plan Note (Signed)
Continues on the oxycodone Needs to work on home strengthening

## 2021-12-27 NOTE — Telephone Encounter (Signed)
Spoke to [place]. Found [place] certification order in media. Provided her with those dates.

## 2021-12-28 DIAGNOSIS — N2581 Secondary hyperparathyroidism of renal origin: Secondary | ICD-10-CM | POA: Insufficient documentation

## 2021-12-28 LAB — PARATHYROID HORMONE, INTACT (NO CA): PTH: 278 pg/mL — ABNORMAL HIGH (ref 16–77)

## 2021-12-28 NOTE — Assessment & Plan Note (Signed)
Will start vitamin D daily

## 2021-12-30 ENCOUNTER — Telehealth: Payer: Self-pay

## 2021-12-30 MED ORDER — POTASSIUM CHLORIDE 40 MEQ/15ML (20%) PO SOLN: 7.5000 mL | Freq: Every day | ORAL | 11 refills | Status: DC

## 2021-12-30 NOTE — Telephone Encounter (Signed)
Pharmacy said to send rx for potassium chloride 28mq/15ml take 7.533mdaily. I have sent that in. Not sure if it is covered by insurance.

## 2021-12-30 NOTE — Telephone Encounter (Signed)
Left message to call office

## 2021-12-30 NOTE — Telephone Encounter (Signed)
Per Dr Silvio Pate: The potassium is still low. Tina Pacheco will call about getting a different potassium that you can swallow easier. Your vitamin D is low (and that causes the PTH to be high). Please add over the counter vitamin D 1000 units daily. Cholesterol levels are good Kidney function (BUN, creatinine and GFR) are actually a little better Blood count (hemoglobin and HCT) have also improved Everything else is pretty much normal (calcium and glucose not an issue)

## 2022-01-04 DIAGNOSIS — M0579 Rheumatoid arthritis with rheumatoid factor of multiple sites without organ or systems involvement: Secondary | ICD-10-CM | POA: Diagnosis not present

## 2022-01-17 ENCOUNTER — Telehealth: Payer: Self-pay

## 2022-01-17 NOTE — Telephone Encounter (Signed)
Blanch Media from [facility] screening called to ck to verify orders faxed to Dr Silvio Pate for primary immunodeficiency lab orders had been received. I printed off [place]. I asked Blanch Media if this request was generated by [facility] or did the pt request the labs to be done. Blanch Media said pt requested labs to be done if Dr Silvio Pate would approve order. I spoke with [name] pts daughter (DPR signed) and she spoke with pt and pt said someone called from [facility] and said that Dr Silvio Pate had requested primary immunodeficiency labs and they would request signature for orders from Dr Silvio Pate.  [name] said this is a scam that pt did not request these lab test to be done. Sending note to Dr Silvio Pate as Juluis Rainier and gave printed form to [facility] CMA.

## 2022-01-23 ENCOUNTER — Other Ambulatory Visit: Payer: Self-pay | Admitting: Internal Medicine

## 2022-01-23 MED ORDER — OXYCODONE HCL 20 MG PO TABS: 1.0000 | ORAL_TABLET | Freq: Two times a day (BID) | ORAL | 0 refills | Status: DC | PRN

## 2022-01-23 NOTE — Telephone Encounter (Signed)
Name of Medication: oxycodone 20 mg Name of Pharmacy: [facility] [address] Last Fill or Written Date and Quantity: 12-17-21 #60 Last Office Visit and Type: 12-27-21 Next Office Visit and Type: 03-30-22 Last Controlled Substance Agreement Date: 11/04/2020 Last UDS:04/20/21

## 2022-01-23 NOTE — Telephone Encounter (Signed)
Please see note below access nurse note. Sending note to Dr Silvio Pate.   [place] Night - Client Nonclinical Telephone Record  AccessNurse Client [place] Night - Client Client Site [place] Provider [name] Contact Type Call Who Is Calling Patient / Member / Family / Caregiver Caller Name [place] Phone Number [phone] Patient Name Tina Pacheco Patient DOB 05/12/1950 Call Type Message Only Information Provided Reason for Call Medication Question / Request Initial Comment Caller states she needs a refill on her oxycodone. Additional Comment Provide office hours. Disp. Time Disposition Final User 01/20/2022 9:14:38 AM General Information Provided Yes Lelon Mast Call Closed By: Lelon Mast Transaction Date/Time: 01/20/2022 9:11:05 AM

## 2022-01-23 NOTE — Telephone Encounter (Signed)
  Encourage patient to contact the pharmacy for refills or they can request refills through [place]:  Please schedule appointment if longer than 1 year  NEXT APPOINTMENT DATE:  MEDICATION:Oxycodone HCl 20 MG TABS   Is the patient out of medication?   PHARMACY: [facility] [phone] -     Let patient know to contact pharmacy at the end of the day to make sure medication is ready.  Please notify patient to allow 48-72 hours to process  CLINICAL FILLS OUT ALL BELOW:   LAST REFILL:  QTY:  REFILL DATE:    OTHER COMMENTS:    Okay for refill?  Please advise

## 2022-01-25 NOTE — Telephone Encounter (Signed)
Blanch Media called again re: lab orders. Advised her that the Provider has not ordered this lab. Blanch Media stated that the patient requested the lab.  Roderic Scarce that the patient would need to speak with her PCP about this lab as we have our own lab services here

## 2022-01-29 ENCOUNTER — Other Ambulatory Visit: Payer: Self-pay | Admitting: Internal Medicine

## 2022-02-01 DIAGNOSIS — Z79899 Other long term (current) drug therapy: Secondary | ICD-10-CM | POA: Diagnosis not present

## 2022-02-01 DIAGNOSIS — M1A00X0 Idiopathic chronic gout, unspecified site, without tophus (tophi): Secondary | ICD-10-CM | POA: Diagnosis not present

## 2022-02-01 DIAGNOSIS — M159 Polyosteoarthritis, unspecified: Secondary | ICD-10-CM | POA: Diagnosis not present

## 2022-02-01 DIAGNOSIS — M0579 Rheumatoid arthritis with rheumatoid factor of multiple sites without organ or systems involvement: Secondary | ICD-10-CM | POA: Diagnosis not present

## 2022-02-01 DIAGNOSIS — L600 Ingrowing nail: Secondary | ICD-10-CM | POA: Diagnosis not present

## 2022-02-01 DIAGNOSIS — B351 Tinea unguium: Secondary | ICD-10-CM | POA: Diagnosis not present

## 2022-02-01 DIAGNOSIS — M79675 Pain in left toe(s): Secondary | ICD-10-CM | POA: Diagnosis not present

## 2022-02-01 DIAGNOSIS — M79674 Pain in right toe(s): Secondary | ICD-10-CM | POA: Diagnosis not present

## 2022-02-16 DIAGNOSIS — S46012A Strain of muscle(s) and tendon(s) of the rotator cuff of left shoulder, initial encounter: Secondary | ICD-10-CM | POA: Diagnosis not present

## 2022-02-16 DIAGNOSIS — M25512 Pain in left shoulder: Secondary | ICD-10-CM | POA: Diagnosis not present

## 2022-02-16 DIAGNOSIS — L89152 Pressure ulcer of sacral region, stage 2: Secondary | ICD-10-CM | POA: Diagnosis not present

## 2022-02-17 ENCOUNTER — Other Ambulatory Visit: Payer: Self-pay | Admitting: Orthopedic Surgery

## 2022-02-17 DIAGNOSIS — S46012A Strain of muscle(s) and tendon(s) of the rotator cuff of left shoulder, initial encounter: Secondary | ICD-10-CM

## 2022-02-21 ENCOUNTER — Other Ambulatory Visit: Payer: Self-pay | Admitting: Internal Medicine

## 2022-02-21 MED ORDER — OXYCODONE HCL 20 MG PO TABS: 1.0000 | ORAL_TABLET | Freq: Two times a day (BID) | ORAL | 0 refills | Status: DC | PRN

## 2022-02-21 NOTE — Telephone Encounter (Signed)
Name of Medication: oxycodone 20 mg Name of Pharmacy: [facility] [address] Last Fill or Written Date and Quantity: 01-23-22 #60 Last Office Visit and Type: 12-27-21 Next Office Visit and Type: 03-30-22 Last Controlled Substance Agreement Date: 11/04/2020 Last UDS:04/20/21

## 2022-02-21 NOTE — Telephone Encounter (Signed)
  Encourage patient to contact the pharmacy for refills or they can request refills through [facility]  Did the patient contact the pharmacy: No  LAST APPOINTMENT DATE: 12/27/2021  NEXT APPOINTMENT DATE: 03/30/2022  MEDICATION: Oxycodone HCl 20 MG TABS   Is the patient out of medication? Yes  PHARMACY:  [id number] - [place]    Let patient know to contact pharmacy at the end of the day to make sure medication is ready.  Please notify patient to allow 48-72 hours to process

## 2022-02-28 ENCOUNTER — Encounter: Payer: Self-pay | Admitting: Physician Assistant

## 2022-02-28 ENCOUNTER — Telehealth: Payer: Self-pay

## 2022-02-28 ENCOUNTER — Ambulatory Visit: Payer: Medicare Other | Attending: Physician Assistant | Admitting: Physician Assistant

## 2022-02-28 ENCOUNTER — Other Ambulatory Visit
Admission: RE | Admit: 2022-02-28 | Discharge: 2022-02-28 | Disposition: A | Discharge status: Home / Self Care | Payer: Medicare Other | Attending: Physician Assistant | Admitting: Physician Assistant

## 2022-02-28 ENCOUNTER — Telehealth: Payer: Self-pay | Admitting: *Deleted

## 2022-02-28 VITALS — BP 122/72 | HR 51 | Ht 63.0 in | Wt 250.0 lb

## 2022-02-28 DIAGNOSIS — E876 Hypokalemia: Secondary | ICD-10-CM | POA: Diagnosis not present

## 2022-02-28 DIAGNOSIS — I2510 Atherosclerotic heart disease of native coronary artery without angina pectoris: Secondary | ICD-10-CM

## 2022-02-28 DIAGNOSIS — I480 Paroxysmal atrial fibrillation: Secondary | ICD-10-CM | POA: Insufficient documentation

## 2022-02-28 DIAGNOSIS — I5032 Chronic diastolic (congestive) heart failure: Secondary | ICD-10-CM | POA: Diagnosis not present

## 2022-02-28 DIAGNOSIS — I890 Lymphedema, not elsewhere classified: Secondary | ICD-10-CM | POA: Insufficient documentation

## 2022-02-28 DIAGNOSIS — Z6841 Body Mass Index (BMI) 40.0 and over, adult: Secondary | ICD-10-CM | POA: Diagnosis not present

## 2022-02-28 DIAGNOSIS — N183 Chronic kidney disease, stage 3 unspecified: Secondary | ICD-10-CM | POA: Insufficient documentation

## 2022-02-28 DIAGNOSIS — I10 Essential (primary) hypertension: Secondary | ICD-10-CM | POA: Diagnosis not present

## 2022-02-28 DIAGNOSIS — E782 Mixed hyperlipidemia: Secondary | ICD-10-CM | POA: Insufficient documentation

## 2022-02-28 LAB — CBC
HCT: 35.5 % — ABNORMAL LOW (ref 36.0–46.0)
Hemoglobin: 11.1 g/dL — ABNORMAL LOW (ref 12.0–15.0)
MCH: 29.6 pg (ref 26.0–34.0)
MCHC: 31.3 g/dL (ref 30.0–36.0)
MCV: 94.7 fL (ref 80.0–100.0)
Platelets: 194 10*3/uL (ref 150–400)
RBC: 3.75 MIL/uL — ABNORMAL LOW (ref 3.87–5.11)
RDW: 14.5 % (ref 11.5–15.5)
WBC: 6.6 10*3/uL (ref 4.0–10.5)
nRBC: 0 % (ref 0.0–0.2)

## 2022-02-28 LAB — BASIC METABOLIC PANEL
Anion gap: 14 (ref 5–15)
BUN: 47 mg/dL — ABNORMAL HIGH (ref 8–23)
CO2: 28 mmol/L (ref 22–32)
Calcium: 9.9 mg/dL (ref 8.9–10.3)
Chloride: 94 mmol/L — ABNORMAL LOW (ref 98–111)
Creatinine, Ser: 1.77 mg/dL — ABNORMAL HIGH (ref 0.44–1.00)
GFR, Estimated: 30 mL/min — ABNORMAL LOW (ref >60)
Glucose, Bld: 94 mg/dL (ref 70–99)
Potassium: 2.9 mmol/L — ABNORMAL LOW (ref 3.5–5.1)
Sodium: 136 mmol/L (ref 135–145)

## 2022-02-28 MED ORDER — TORSEMIDE 20 MG PO TABS: 20.0000 mg | ORAL_TABLET | Freq: Every day | ORAL | 0 refills | Status: DC

## 2022-02-28 NOTE — Progress Notes (Signed)
Cardiology Office Note    Date:  02/28/2022   ID:  Tina, Pacheco 02/21/1951, MRN [id number]  PCP:  Tina Carbon, MD  Cardiologist:  Ida Rogue, MD  Electrophysiologist:  None   Chief Complaint: Follow-up  History of Present Illness:   Tina Pacheco is a 72 y.o. female with history of nonobstructive CAD by LHC in 05/2020, HFpEF, PAF on Eliquis, CKD stage III, HTN, HLD, anemia, COPD, rheumatoid arthritis, fibromyalgia, falls, morbid obesity, H. pylori, and OSA who presents for follow-up of HFpEF and A-fib.   LHC in 2012 showed nonobstructive CAD.  She was diagnosed with A. fib in 2012.  Echo in 2016 showed an EF of 55 to 60%, no regional wall motion normalities, grade 2 diastolic dysfunction, and mild mitral regurgitation.  She was admitted in 02/2019 with weakness and multiple falls and acute on chronic diastolic CHF.  Echo showed an EF of 55 to 60%, mild LVH, grade 1 diastolic dysfunction, no regional wall motion abnormalities, mildly to moderately dilated left atrium, moderate mitral valve calcification without regurgitation or stenosis.  She was admitted in 12/2019 with volume overload and weight gain.  She was IV diuresed.  Echo showed an EF of 60 to 65%, no regional wall motion abnormalities, mild LVH, normal LV diastolic function parameters, normal RV systolic function and ventricular cavity size, trivial mitral regurgitation, and trivial aortic valve insufficiency.  She was admitted in 05/2020 with  acute hypoxic respiratory failure due to pulmonary edema in the setting of acute on chronic diastolic CHF and A. fib with RVR.  She converted to sinus rhythm with amiodarone.  Echo demonstrated an EF of 60 to 65%, no regional wall motion abnormalities, mild LVH, grade 1 diastolic dysfunction, normal RV systolic function and ventricular cavity size, and a moderately dilated left atrium.  Lexiscan MPI was abnormal with a large in size, moderate in severity, completely reversible defect  involving the mid anterior, anterolateral, and inferolateral segments as well as the apical anterior and lateral segments.  The defect was concerning for ischemia, though artifact could not be excluded.  LVEF was normal.  Attenuation corrected CT imaging showed coronary artery calcification, aortic atherosclerosis, and dense mitral annular calcification.  Overall, this was an intermediate restudy.  Given abnormal MPI, she underwent LHC on 06/04/2020 which showed no evidence of obstructive CAD with ostial to mid LAD 40% stenosis and distal LAD 30% stenosis.   She was seen in the office in 07/2020 with dramatic documented weight loss trending from 353 in 06/2020 trending to 301 in 07/2020.  Recommendation to continue torsemide 40 mg daily with an extra 40 mg as needed and hold metolazone unless weight was greater than 310 pounds.  Phone note after that visit showed to the patient was woken up on 08/03/2020 with chest pain and shortness of breath lasting approximately 45 minutes.  Patient indicated EMS came out with patient reporting EKG showed A-fib.  Note indicates she refused transport to the ED.   She was admitted to the hospital in 03/2021 with acute metabolic encephalopathy felt to likely be related to polypharmacy with oxycodone, gabapentin, and recent addition of Phenergan in the context of acute on CKD.  She had fallen numerous times prior to her admission.  Head CT was negative for acute process.  Ammonia normal.  High-sensitivity troponin normal x3.  BNP 133.  EKG showed sinus rhythm without acute ST-T changes.  She was last seen in our office in 06/2020 with recommendation to  take torsemide 40 mg twice daily and metolazone for "leg edema."  She was admitted to the hospital in 08/2021 with weakness and acute on CKD stage IIIb with severe hypokalemia with a potassium of 2.5.  She had been taking metolazone on a daily basis.  Admission serum creatinine was 2.37 with a baseline around 1.2.  Follow-up labs in the  patient setting have showed continued improvement in renal function.  She has been maintained on torsemide 40 mg daily and metolazone 2.5 mg as needed for weight gain greater than 280 pounds.  She comes in doing reasonably well from a cardiac perspective and is without symptoms of angina or decompensation.  No significant dyspnea, palpitations, or syncope.  Her weight is down greater than 50 pounds when compared to her last visit in our office in 06/2021.  However, she does continue to note significant fluctuations with intermittent rapid weight gain.  Caretaker present with her in the office today indicates this is somewhat in the setting of increased sodium intake at times.  She is taking torsemide 40 mg daily and at times will take an additional 20 mg torsemide.  She has also intermittently taken metolazone with weight less than 280 pounds.  Her lower extremity swelling is significantly improved.  She also notes easy bruising.  No falls or symptoms concerning for bleeding otherwise.  Overall, she feels like she is doing well from a cardiac perspective.   Labs independently reviewed: 11/2021 - TSH normal, Hgb 11.6, PLT 179, TC 149, TG 160, HDL 46, LDL 71, potassium 3.1, BUN 33, serum creatinine 1.33, albumin 3.9, AST/ALT normal  Past Medical History:  Diagnosis Date   Anemia    Arthritis    RA   Asthma    Basal cell carcinoma    Cataract 2018   bilateral eyes; corrected with surgery   Chronic diastolic CHF (congestive heart failure) ([place])    Claustrophobia    Collagen vascular disease ([place])    RA   COPD (chronic obstructive pulmonary disease) (HCC)    Diastolic dysfunction    a. echo 07/2014: EF 55-60%, no RWMA, GR2DD, mild MR, LA moderately dilated, PASP 38 mm Hg   Dyslipidemia    Headache    migraines   Hemihypertrophy    History of cardiac cath    a. cardiac cath 05/24/2010 - nonobstructive CAD   History of gout    Hyperplastic colonic polyp 2003   Hypertension    Hypokalemia     Morbid obesity (HCC)    PAF (paroxysmal atrial fibrillation) ([place])    a. on Pradaxa; b. CHADSVASc at least 2 (HTN & female)   Rheumatoid arthritis(714.0)    Sleep apnea    a. not compliant with CPAP   Stage 3 chronic kidney disease ([place])     Past Surgical History:  Procedure Laterality Date   ABDOMINAL HYSTERECTOMY     APPLICATION OF WOUND VAC Right 08/09/2017   Procedure: APPLICATION OF WOUND VAC;  Surgeon: Albertine Patricia, DPM;  Location: [facility];  Service: Podiatry;  Laterality: Right;   CARDIAC CATHETERIZATION  05/24/2010   nonobstructive CAD   CATARACT EXTRACTION W/ INTRAOCULAR LENS IMPLANT Right 06/12/2016   Dr. Darleen Crocker   CATARACT EXTRACTION W/ INTRAOCULAR LENS IMPLANT Left 06/26/2016   Dr. Darleen Crocker   CESAREAN SECTION     CHOLECYSTECTOMY     COLONOSCOPY  06/12/2011   Procedure: COLONOSCOPY;  Surgeon: Juanita Craver, MD;  Location: [facility];  Service: Endoscopy;  Laterality: N/A;  COLONOSCOPY N/A 03/17/2013   Procedure: COLONOSCOPY;  Surgeon: Juanita Craver, MD;  Location: [facility];  Service: Endoscopy;  Laterality: N/A;   EYE SURGERY     FOOT ARTHRODESIS Right 07/13/2017   Procedure: ARTHRODESIS FOOT-MULTI.FUSIONS (6 JOINTS);  Surgeon: Albertine Patricia, DPM;  Location: [facility];  Service: Podiatry;  Laterality: Right;   IRRIGATION AND DEBRIDEMENT FOOT Right 08/09/2017   Procedure: IRRIGATION AND DEBRIDEMENT FOOT;  Surgeon: Albertine Patricia, DPM;  Location: [facility];  Service: Podiatry;  Laterality: Right;   KNEE ARTHROSCOPY     bilateral   LEFT HEART CATH AND CORONARY ANGIOGRAPHY N/A 06/04/2020   Procedure: LEFT HEART CATH AND CORONARY ANGIOGRAPHY;  Surgeon: Wellington Hampshire, MD;  Location: [facility];  Service: Cardiovascular;  Laterality: N/A;   REVERSE SHOULDER ARTHROPLASTY Right 11/16/2020   Procedure: REVERSE SHOULDER ARTHROPLASTY;  Surgeon: Corky Mull, MD;  Location: [facility];  Service: Orthopedics;  Laterality: Right;   SHOULDER CLOSED REDUCTION  Right 11/11/2020   Procedure: CLOSED REDUCTION SHOULDER;  Surgeon: Corky Mull, MD;  Location: [facility];  Service: Orthopedics;  Laterality: Right;   SHOULDER INJECTION Right 11/11/2020   Procedure: SHOULDER INJECTION;  Surgeon: Corky Mull, MD;  Location: [facility];  Service: Orthopedics;  Laterality: Right;   SINUSOTOMY     TOOTH EXTRACTION  12/2016   VAGINAL HYSTERECTOMY      Current Medications: Current Meds  Medication Sig   amiodarone (PACERONE) 200 MG tablet TAKE 1 TABLET(200 MG) BY MOUTH DAILY   atorvastatin (LIPITOR) 40 MG tablet TAKE 1 TABLET(40 MG) BY MOUTH DAILY   Certolizumab Pegol (CIMZIA [place]) Inject into the skin. Patient states taking 1 x per month   docusate sodium (COLACE) 100 MG capsule Take 1 capsule (100 mg total) by mouth 2 (two) times daily.   ELIQUIS 5 MG TABS tablet TAKE 1 TABLET TWICE A DAY   gabapentin (NEURONTIN) 300 MG capsule Take 300 mg by mouth 2 (two) times daily as needed.   ketoconazole (NIZORAL) 2 % cream APPLY A SMALL AMOUNT TO AFFECTED AREA ONCE A DAY TO RASH ON BUTTOCKS, GROIN   metolazone (ZAROXOLYN) 2.5 MG tablet Take 1 tablet (2.5 mg total) by mouth daily as needed. If home weight is over 280#   metoprolol succinate (TOPROL-XL) 50 MG 24 hr tablet TAKE 1 TABLET BY MOUTH EVERY DAY WITH OR IMMEDIATELY FOLLOWING A MEAL   montelukast (SINGULAIR) 10 MG tablet TAKE 1 TABLET BY MOUTH AT BEDTIME   nitroGLYCERIN (NITROSTAT) 0.4 MG SL tablet Place 1 tablet (0.4 mg total) under the tongue every 5 (five) minutes as needed for chest pain.   nystatin (MYCOSTATIN/NYSTOP) powder Apply 1 application topically 3 (three) times daily as needed.   ondansetron (ZOFRAN-ODT) 4 MG disintegrating tablet DISSOLVE 1 TABLET(4 MG) ON THE TONGUE EVERY 8 HOURS AS NEEDED FOR NAUSEA OR VOMITING   Oxycodone HCl 20 MG TABS Take 1 tablet (20 mg total) by mouth 2 (two) times daily as needed.   polyethylene glycol (MIRALAX / GLYCOLAX) packet Take 17 g by mouth daily as needed for mild  constipation.   Potassium Chloride 40 MEQ/15ML (20%) SOLN Take 7.5 mLs by mouth daily.   predniSONE (DELTASONE) 5 MG tablet Take 5 mg by mouth daily with breakfast.   spironolactone (ALDACTONE) 25 MG tablet TAKE 1 TABLET(25 MG) BY MOUTH DAILY   terazosin (HYTRIN) 10 MG capsule TAKE 1 CAPSULE(10 MG) BY MOUTH AT BEDTIME   torsemide (DEMADEX) 20 MG tablet Take 2 tablets (40 mg  total) by mouth daily.   triamcinolone ointment (KENALOG) 0.1 % Apply twice daily to affected areas of rash at legs. Avoid applying to face, groin, and axilla. Use as directed. Long-term use can cause thinning of the skin.    Allergies:   Fluoxetine, Phenergan [promethazine], and Codeine   Social History   Socioeconomic History   Marital status: Married    Spouse name: Not on file   Number of children: 1   Years of education: Not on file   Highest education level: Not on file  Occupational History   Occupation: [place] service    Employer: [employer]    Comment: Retired 2007  Tobacco Use   Smoking status: Never    Passive exposure: Current   Smokeless tobacco: Never  Vaping Use   Vaping Use: Never used  Substance and Sexual Activity   Alcohol use: No   Drug use: No   Sexual activity: Not Currently  Other Topics Concern   Not on file  Social History Narrative   No living will   Daughter should be decision maker   Would accept resuscitation attempts--but no prolonged ventilator or tube feeds   Social Determinants of Health   Financial Resource Strain: Not on file  Food Insecurity: No Food Insecurity (07/08/2020)   Hunger Vital Sign    Worried About Running Out of Food in the Last Year: Never true    Ran Out of Food in the Last Year: Never true  Transportation Needs: No Transportation Needs (07/08/2020)   PRAPARE - Hydrologist (Medical): No    Lack of Transportation (Non-Medical): No  Physical Activity: Not on file  Stress: Not on file  Social Connections: Not on file      Family History:  The patient's family history includes Arthritis in her father; Breast cancer in her maternal aunt and sister; Cancer in her sister; Diabetes type II in her sister; Emphysema in her father; Parkinsonism in her mother; Tuberculosis in her mother and sister. There is no history of Colon cancer, Esophageal cancer, Pancreatic cancer, or Stomach cancer.  ROS:   12-point review of systems is negative unless otherwise noted in the HPI.   EKGs/Labs/Other Studies Reviewed:    Studies reviewed were summarized above. The additional studies were reviewed today:  LHC 06/04/2020: Dist LAD lesion is 30% stenosed. Ost LAD to Mid LAD lesion is 40% stenosed.   1.  No evidence of obstructive coronary artery disease.  Relatively stable mild to moderate proximal LAD disease. 2.  Left ventricular angiography was not performed.  EF is normal by echo. 3.  Mildly elevated left ventricular end-diastolic pressure.   Recommendations: Continue medical therapy. Eliquis can be resumed tomorrow morning. __________   Carlton Adam MPI 05/31/2020: Abnormal pharmacologic myocardial perfusion stress test. There is a large in size, moderate in severity, completely reversible defect involving the mid anterior, anterolateral, and inferolateral segments, as well as the apical anterior and lateral segments. Defect is concerning for ischemia, though artifact cannot be excluded (shifting breast attenuation). Left ventricular systolic function is normal (LVEF 58%). Attenuation correction CT shows coronary artery calcification, aortic atherosclerosis, and dense mitral annular calcification. This is an intermediate risk study. Sensitivity and specificity of the study are degraded by the patient's morbid obesity. __________   2D echo 05/29/2020: 1. Left ventricular ejection fraction, by estimation, is 60 to 65%. The  left ventricle has normal function. The left ventricle has no regional  wall motion abnormalities.  There is mild  left ventricular hypertrophy.  Left ventricular diastolic parameters  are consistent with Grade I diastolic dysfunction (impaired relaxation).   2. Right ventricular systolic function is normal. The right ventricular  size is normal. Tricuspid regurgitation signal is inadequate for assessing  PA pressure.   3. Left atrial size was moderately dilated.    EKG:  EKG is ordered today.  The EKG ordered today demonstrates sinus bradycardia, 51 bpm, low voltage QRS, nonspecific ST-T changes  Recent Labs: 09/01/2021: B Natriuretic Peptide 65.2 09/09/2021: Magnesium 1.6 12/27/2021: ALT 24; BUN 33; Creatinine, Ser 1.33; Hemoglobin 11.6; Platelets 179.0; Potassium 3.1; Sodium 138; TSH 2.48  Recent Lipid Panel    Component Value Date/Time   CHOL 149 12/27/2021 1202   TRIG 160.0 (H) 12/27/2021 1202   HDL 46.60 12/27/2021 1202   CHOLHDL 3 12/27/2021 1202   VLDL 32.0 12/27/2021 1202   LDLCALC 71 12/27/2021 1202    PHYSICAL EXAM:    VS:  BP 122/72 (BP Location: Left Arm, Patient Position: Sitting, Cuff Size: Normal)   Pulse (!) 51   Ht '5\' 3"'$  (1.6 m)   Wt 250 lb (113.4 kg)   SpO2 94%   BMI 44.29 kg/m   BMI: Body mass index is 44.29 kg/m.  Physical Exam Vitals reviewed.  Constitutional:      Appearance: She is well-developed.  HENT:     Head: Normocephalic and atraumatic.  Eyes:     General:        Right eye: No discharge.        Left eye: No discharge.  Neck:     Comments: JVD difficult to assess secondary to body habitus. Cardiovascular:     Rate and Rhythm: Regular rhythm. Bradycardia present.     Heart sounds: Normal heart sounds, S1 normal and S2 normal. Heart sounds not distant. No midsystolic click and no opening snap. No murmur heard.    No friction rub.  Pulmonary:     Effort: Pulmonary effort is normal. No respiratory distress.     Breath sounds: Normal breath sounds. No decreased breath sounds, wheezing or rales.  Chest:     Chest wall: No tenderness.   Abdominal:     General: There is no distension.  Musculoskeletal:     Cervical back: Normal range of motion.     Comments: Trivial bilateral pretibial nonpitting swelling.  Skin:    General: Skin is warm and dry.     Nails: There is no clubbing.  Neurological:     Mental Status: She is alert and oriented to person, place, and time.  Psychiatric:        Speech: Speech normal.        Behavior: Behavior normal.        Thought Content: Thought content normal.        Judgment: Judgment normal.     Wt Readings from Last 3 Encounters:  02/28/22 250 lb (113.4 kg)  12/27/21 252 lb (114.3 kg)  09/09/21 261 lb (118.4 kg)     ASSESSMENT & PLAN:   Nonobstructive CAD: No symptoms suggestive of angina.  She remains on apixaban, in place of aspirin to minimize bleeding risk given underlying A-fib.  Continue aggressive risk factor modification and primary prevention with atorvastatin and metoprolol.  No indication for ischemic testing at this time.  PAF: Maintaining sinus rhythm with a mildly bradycardic rate, asymptomatic.  She remains on metoprolol and amiodarone.  TSH and AST/ALT normal on recent check.  CHA2DS2-VASc at least 5 (CHF, HTN,  age x 1, vascular disease, sex category).  She remains on apixaban 5 mg twice daily and does not meet reduced dosing criteria.  She does note some bruising.  Check CBC and BMP.  HFpEF: She appears euvolemic and well compensated.  She does continue to note significant fluctuations in her weight, though it appears these are largely driven by increases in sodium intake.  Currently, she remains on torsemide 40 mg daily, though she does take an additional 20 mg as needed.  She also remains on metolazone 2.5 mg as needed for weight greater than 280, though she has taken metolazone with weights less than 280 due to fluctuations in weight, which again are largely exacerbated by sodium intake.  Eats frozen meals and takeout food often.  Recommend minimization of sodium  intake.  Lymphedema: Much improved.  Continue leg elevation.  Follow-up with vascular surgery as directed.  HTN: Blood pressure is well-controlled in the office today.  No changes in medical therapy.  HLD: LDL 71 and normal AST/ALT in 11/2021.  She remains on atorvastatin 40 mg.  CKD stage III: Improved on most recent check, close to baseline.  Check BMP.  Anemia: Stable.  Morbid obesity: Dramatic weight loss of greater than 50 pounds since her last in person visit with our office in 06/2021.  Weight loss is encouraged through regular exercise and heart healthy diet.  Fatigue: Stable.  Likely multifactorial including, though not limited to, physical deconditioning, morbid obesity, lymphedema, anemia, HFpEF, and chronic pain with fibromyalgia.  Hypokalemia: On spironolactone and KCl.  Check BMP.   Disposition: F/u with Dr. Rockey Situ or an APP in 6 months.   Medication Adjustments/Labs and Tests Ordered: Current medicines are reviewed at length with the patient today.  Concerns regarding medicines are outlined above. Medication changes, Labs and Tests ordered today are summarized above and listed in the Patient Instructions accessible in Encounters.   Signed, Christell Faith, PA-C 02/28/2022 10:53 AM     [facility] [address] [place] [address] [place], [place] [address] [phone]

## 2022-02-28 NOTE — Telephone Encounter (Signed)
-----   Message from Rise Mu, PA-C sent at 02/28/2022 11:44 AM EST ----- Blood count is mildly low, though stable. Platelet count normal. Potassium low at 2.9. Renal function has trended back up and is slightly worse.  Recommendations: -Decrease torsemide to 20 mg daily  -Only take metolazone for weight greater than 280 pounds -Increase KCl liquid solution to 15 mL (40 mEq) bid x 2 days then 15 mL (40 mEq) daily thereafter -Repeat BMET in 1 week to ensure renal function has trended back to baseline and that potassium is improved

## 2022-02-28 NOTE — Telephone Encounter (Signed)
Reviewed results and recommendations with patient and she verbalized understanding and repeated all information back. Sent all information via My Chart and recommended that she review this again. She had no further questions at this time.

## 2022-02-28 NOTE — Telephone Encounter (Signed)
*  STAT* If patient is at the pharmacy, call can be transferred to refill team.   1. Which medications need to be refilled? (please list name of each medication and dose if known) Eliquis  2. Which pharmacy/location (including street and city if local pharmacy) is medication to be sent to? [facility]  3. Do they need a 30 day or 90 day supply? [name]

## 2022-02-28 NOTE — Patient Instructions (Signed)
Medication Instructions:  Your physician recommends that you continue on your current medications as directed. Please refer to the Current Medication list given to you today.  *If you need a refill on your cardiac medications before your next appointment, please call your pharmacy*  Lab Work: BMP and CBC - Please go to the [facility]. You will check in at the front desk to the right as you walk into the atrium. Valet Parking is offered if needed. - No appointment needed. You may go any day between 7 am and 6 pm.  If you have labs (blood work) drawn today and your tests are completely normal, you will receive your results only by: [place] (if you have MyChart) OR A paper copy in the mail If you have any lab test that is abnormal or we need to change your treatment, we will call you to review the results.  Testing/Procedures: No testing ordered  Follow-Up: At [facility], you and your health needs are our priority.  As part of our continuing mission to provide you with exceptional heart care, we have created designated Provider Care Teams.  These Care Teams include your primary Cardiologist (physician) and Advanced Practice Providers (APPs -  Physician Assistants and Nurse Practitioners) who all work together to provide you with the care you need, when you need it.  We recommend signing up for the patient portal called "MyChart".  Sign up information is provided on this After Visit Summary.  MyChart is used to connect with patients for Virtual Visits (Telemedicine).  Patients are able to view lab/test results, encounter notes, upcoming appointments, etc.  Non-urgent messages can be sent to your provider as well.   To learn more about what you can do with MyChart, go to [email or web address].    Your next appointment:   6 month(s)  The format for your next appointment:   In Person  Provider:   Ida Rogue, MD   Important Information About Sugar

## 2022-03-08 ENCOUNTER — Telehealth: Payer: Self-pay | Admitting: Physician Assistant

## 2022-03-08 DIAGNOSIS — I480 Paroxysmal atrial fibrillation: Secondary | ICD-10-CM

## 2022-03-08 MED ORDER — APIXABAN 5 MG PO TABS: 5.0000 mg | ORAL_TABLET | Freq: Two times a day (BID) | ORAL | 1 refills | Status: DC

## 2022-03-08 NOTE — Telephone Encounter (Signed)
Prescription refill request for Eliquis received. Indication: PAF Last office visit: 02/28/22  R Dunn PA-C Scr: 1.77 on 02/28/22 Age:  72 Weight: 113.4 kg  Based on above findings Eliquis '5mg'$  twice daily is the appropriate dose.  Refill approved.

## 2022-03-08 NOTE — Telephone Encounter (Signed)
*  STAT* If patient is at the pharmacy, call can be transferred to refill team.   1. Which medications need to be refilled? (please list name of each medication and dose if known) Eliquis  2. Which pharmacy/location (including street and city if local pharmacy) is medication to be sent to? [facility]  3. Do they need a 30 day or 90 day supply? 90 days and refills- need this called in today please, been out of it

## 2022-03-08 NOTE — Addendum Note (Signed)
Addended by: Rollen Sox on: 03/08/2022 02:40 PM   Modules accepted: Orders

## 2022-03-08 NOTE — Telephone Encounter (Signed)
Please advise 

## 2022-03-08 NOTE — Telephone Encounter (Signed)
Please review

## 2022-03-08 NOTE — Telephone Encounter (Signed)
[facility]

## 2022-03-08 NOTE — Telephone Encounter (Signed)
Patient called back stating her prescription for apixaban (ELIQUIS) 5 MG TABS tablet was sent to [facility] in [place] and she needs the prescription to go to [facility] in [place], [place].

## 2022-03-09 ENCOUNTER — Ambulatory Visit
Admission: RE | Admit: 2022-03-09 | Discharge: 2022-03-09 | Disposition: A | Discharge status: Home / Self Care | Payer: Medicare Other | Source: Ambulatory Visit | Attending: Orthopedic Surgery | Admitting: Orthopedic Surgery

## 2022-03-09 DIAGNOSIS — S46012A Strain of muscle(s) and tendon(s) of the rotator cuff of left shoulder, initial encounter: Secondary | ICD-10-CM

## 2022-03-09 NOTE — Telephone Encounter (Signed)
Yes, patient can get medication at [facility]

## 2022-03-09 NOTE — Telephone Encounter (Signed)
Spoke with patients daughter per release form. She states patient has been out of eliquis because pharmacy will not fill since mail order pharmacy is processing current order. Reviewed patient assistance application for use when she meets out of pocket expenses and offered some samples to last until she receives her mail order medication. She was appreciative and will come over to pick that up here shortly.   Medication Samples have been provided to the patient.  Drug name: Eliquis        Strength: 5 mg         Qty: 4 boxes   LOT: [id number]   Exp.Date: July 2025

## 2022-03-09 NOTE — Telephone Encounter (Signed)
Daughter stated patient has been out of this medication for a week.  Daughter states prescription from [facility] is in transit with arrival date 1/18-19.  Daughter stated there is a prescription at [facility] but they will need to get override permission to give the patient medication to tide her over until her full prescription arrives.

## 2022-03-09 NOTE — Telephone Encounter (Signed)
Daughter came in to pick up samples and reviewed patient assistance application and requirements in order to be eligible. She verbalized understanding and was appreciative for the samples. No further needs at this time.

## 2022-03-16 ENCOUNTER — Other Ambulatory Visit
Admission: RE | Admit: 2022-03-16 | Discharge: 2022-03-16 | Disposition: A | Discharge status: Home / Self Care | Payer: Medicare Other | Source: Ambulatory Visit | Attending: Physician Assistant | Admitting: Physician Assistant

## 2022-03-16 DIAGNOSIS — N183 Chronic kidney disease, stage 3 unspecified: Secondary | ICD-10-CM

## 2022-03-16 DIAGNOSIS — I2510 Atherosclerotic heart disease of native coronary artery without angina pectoris: Secondary | ICD-10-CM | POA: Diagnosis not present

## 2022-03-16 DIAGNOSIS — E876 Hypokalemia: Secondary | ICD-10-CM | POA: Diagnosis not present

## 2022-03-16 DIAGNOSIS — I5032 Chronic diastolic (congestive) heart failure: Secondary | ICD-10-CM | POA: Insufficient documentation

## 2022-03-16 DIAGNOSIS — I480 Paroxysmal atrial fibrillation: Secondary | ICD-10-CM

## 2022-03-16 LAB — BASIC METABOLIC PANEL
Anion gap: 11 (ref 5–15)
BUN: 26 mg/dL — ABNORMAL HIGH (ref 8–23)
CO2: 27 mmol/L (ref 22–32)
Calcium: 9.8 mg/dL (ref 8.9–10.3)
Chloride: 99 mmol/L (ref 98–111)
Creatinine, Ser: 1.7 mg/dL — ABNORMAL HIGH (ref 0.44–1.00)
GFR, Estimated: 32 mL/min — ABNORMAL LOW (ref >60)
Glucose, Bld: 161 mg/dL — ABNORMAL HIGH (ref 70–99)
Potassium: 3.7 mmol/L (ref 3.5–5.1)
Sodium: 137 mmol/L (ref 135–145)

## 2022-03-20 DIAGNOSIS — M0579 Rheumatoid arthritis with rheumatoid factor of multiple sites without organ or systems involvement: Secondary | ICD-10-CM | POA: Diagnosis not present

## 2022-03-21 ENCOUNTER — Other Ambulatory Visit: Payer: Self-pay | Admitting: Internal Medicine

## 2022-03-21 MED ORDER — OXYCODONE HCL 20 MG PO TABS: 1.0000 | ORAL_TABLET | Freq: Two times a day (BID) | ORAL | 0 refills | Status: DC | PRN

## 2022-03-21 NOTE — Telephone Encounter (Signed)
Prescription Request  03/21/2022  Is this a "Controlled Substance" medicine? Yes  LOV: 12/27/2021  What is the name of the medication or equipment? Oxycodone HCl 20 MG TABS    Have you contacted your pharmacy to request a refill? No   Which pharmacy would you like this sent to?  [id number] - [place], [place] - [address] [place] [address] Phone: [phone] Fax: [phone]    Patient notified that their request is being sent to the clinical staff for review and that they should receive a response within 2 business days.   Please advise at Mobile [phone] (mobile)

## 2022-03-21 NOTE — Telephone Encounter (Signed)
Name of Medication: oxycodone 20 mg Name of [facility]: [facility] [address] Last Fill or Written Date and Quantity: 02-21-22 #60 Last Office Visit and Type: 12-27-21 Next Office Visit and Type: 03-30-22 Last Controlled Substance Agreement Date: 11/04/2020 Last UDS:04/20/21

## 2022-03-30 ENCOUNTER — Encounter: Payer: Self-pay | Admitting: Internal Medicine

## 2022-03-30 ENCOUNTER — Ambulatory Visit (INDEPENDENT_AMBULATORY_CARE_PROVIDER_SITE_OTHER): Payer: Medicare Other | Admitting: Internal Medicine

## 2022-03-30 VITALS — BP 138/72 | HR 51 | Temp 97.6°F | Ht 63.0 in | Wt 249.0 lb

## 2022-03-30 DIAGNOSIS — G894 Chronic pain syndrome: Secondary | ICD-10-CM | POA: Diagnosis not present

## 2022-03-30 DIAGNOSIS — I2510 Atherosclerotic heart disease of native coronary artery without angina pectoris: Secondary | ICD-10-CM | POA: Diagnosis not present

## 2022-03-30 DIAGNOSIS — F1120 Opioid dependence, uncomplicated: Secondary | ICD-10-CM | POA: Diagnosis not present

## 2022-03-30 NOTE — Assessment & Plan Note (Signed)
Continues to maintain function on the oxycodone More trouble with shoulders--is going back to Dr Roland Rack

## 2022-03-30 NOTE — Assessment & Plan Note (Signed)
PDMP reviewed  No concerns Will check UDS (she may have problems providing)

## 2022-03-30 NOTE — Progress Notes (Signed)
Subjective:    Patient ID: Tina Pacheco, female    DOB: [birth year], 72 y.o.   MRN: [id number]  HPI Here for follow up of chronic pain and narcotic dependence (as well as other chronic issues) Daughter is with her  Continues on the oxycodone 20-40 mg a day---she cuts it into 5-'10mg'$  sizes Back is fair--but having trouble with both shoulders Seeing Dr Roland Rack about left shoulder soon Hard to walk because she now can't hold on (moving around with walker or rolling in wheelchair) Home is not handicapped accessible--so has to use hemiwalker Has walk in tub---can do this. Some trouble washing hair due to shoulders Does have ADLs Cooked/prepared foods  Chronic sleep problems Will sleep in day at times--but other times doesn't sleep at all Sleeps in recliner--in living room No set time for awakening  Current Outpatient Medications on File Prior to Visit  Medication Sig Dispense Refill   amiodarone (PACERONE) 200 MG tablet TAKE 1 TABLET(200 MG) BY MOUTH DAILY 30 tablet 3   apixaban (ELIQUIS) 5 MG TABS tablet Take 1 tablet (5 mg total) by mouth 2 (two) times daily. 180 tablet 1   atorvastatin (LIPITOR) 40 MG tablet TAKE 1 TABLET(40 MG) BY MOUTH DAILY 90 tablet 3   Certolizumab Pegol (CIMZIA [place]) Inject into the skin. Patient states taking 1 x per month     docusate sodium (COLACE) 100 MG capsule Take 1 capsule (100 mg total) by mouth 2 (two) times daily. 60 capsule 2   gabapentin (NEURONTIN) 300 MG capsule Take 300 mg by mouth 2 (two) times daily as needed.     ketoconazole (NIZORAL) 2 % cream APPLY A SMALL AMOUNT TO AFFECTED AREA ONCE A DAY TO RASH ON BUTTOCKS, GROIN 60 g 1   leflunomide (ARAVA) 20 MG tablet Take 1 tablet (20 mg total) by mouth daily. 30 tablet 0   metolazone (ZAROXOLYN) 2.5 MG tablet Take 1 tablet (2.5 mg total) by mouth daily as needed. If home weight is over 280# 1 tablet 0   metoprolol succinate (TOPROL-XL) 50 MG 24 hr tablet TAKE 1 TABLET BY MOUTH EVERY DAY WITH OR  IMMEDIATELY FOLLOWING A MEAL 90 tablet 3   montelukast (SINGULAIR) 10 MG tablet TAKE 1 TABLET BY MOUTH AT BEDTIME 90 tablet 3   nitroGLYCERIN (NITROSTAT) 0.4 MG SL tablet Place 1 tablet (0.4 mg total) under the tongue every 5 (five) minutes as needed for chest pain. 20 tablet 12   nystatin (MYCOSTATIN/NYSTOP) powder Apply 1 application topically 3 (three) times daily as needed. 60 g 0   ondansetron (ZOFRAN-ODT) 4 MG disintegrating tablet DISSOLVE 1 TABLET(4 MG) ON THE TONGUE EVERY 8 HOURS AS NEEDED FOR NAUSEA OR VOMITING 30 tablet 1   Oxycodone HCl 20 MG TABS Take 1 tablet (20 mg total) by mouth 2 (two) times daily as needed. 60 tablet 0   polyethylene glycol (MIRALAX / GLYCOLAX) packet Take 17 g by mouth daily as needed for mild constipation. 14 each 0   Potassium Chloride 40 MEQ/15ML (20%) SOLN Take 7.5 mLs by mouth daily. 2250 mL 11   predniSONE (DELTASONE) 5 MG tablet Take 5 mg by mouth daily with breakfast.     silver sulfADIAZINE (SILVADENE) 1 % cream Apply 1 application. topically daily. Apply 1 gram to infected area once a day 50 g 0   spironolactone (ALDACTONE) 25 MG tablet TAKE 1 TABLET(25 MG) BY MOUTH DAILY 30 tablet 11   tacrolimus (PROTOPIC) 0.1 % ointment Apply topically 2 (two) times daily.  Apply to aa's lower legs as needed for rash 100 g 3   terazosin (HYTRIN) 10 MG capsule TAKE 1 CAPSULE(10 MG) BY MOUTH AT BEDTIME 90 capsule 3   torsemide (DEMADEX) 20 MG tablet Take 1 tablet (20 mg total) by mouth daily. 1 tablet 0   triamcinolone ointment (KENALOG) 0.1 % Apply twice daily to affected areas of rash at legs. Avoid applying to face, groin, and axilla. Use as directed. Long-term use can cause thinning of the skin. 454 g 0   No current facility-administered medications on file prior to visit.    Allergies  Allergen Reactions   Fluoxetine Other (See Comments)    Headache, shaking, sleep issues Headache, shaking, sleep issues   Phenergan [Promethazine] Other (See Comments)     Sleepiness   Codeine     Nausea and vomiting/only when taking too much    Past Medical History:  Diagnosis Date   Anemia    Arthritis    RA   Asthma    Basal cell carcinoma    Cataract 2018   bilateral eyes; corrected with surgery   Chronic diastolic CHF (congestive heart failure) (HCC)    Claustrophobia    Collagen vascular disease (HCC)    RA   COPD (chronic obstructive pulmonary disease) (HCC)    Diastolic dysfunction    a. echo 07/2014: EF 55-60%, no RWMA, GR2DD, mild MR, LA moderately dilated, PASP 38 mm Hg   Dyslipidemia    Headache    migraines   Hemihypertrophy    History of cardiac cath    a. 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 on Pradaxa; b. CHADSVASc at least 2 (HTN & female)   Rheumatoid arthritis(714.0)    Sleep apnea    a. not compliant with CPAP   Stage 3 chronic kidney disease ([place])     Past Surgical History:  Procedure Laterality Date   ABDOMINAL HYSTERECTOMY     APPLICATION OF WOUND VAC Right 08/09/2017   Procedure: APPLICATION OF WOUND VAC;  Surgeon: Albertine Patricia, DPM;  Location: [facility];  Service: Podiatry;  Laterality: Right;   CARDIAC CATHETERIZATION  05/24/2010   nonobstructive CAD   CATARACT EXTRACTION W/ INTRAOCULAR LENS IMPLANT Right 06/12/2016   Dr. Darleen Crocker   CATARACT EXTRACTION W/ INTRAOCULAR LENS IMPLANT Left 06/26/2016   Dr. Darleen Crocker   CESAREAN SECTION     CHOLECYSTECTOMY     COLONOSCOPY  06/12/2011   Procedure: COLONOSCOPY;  Surgeon: Juanita Craver, MD;  Location: [facility];  Service: Endoscopy;  Laterality: N/A;   COLONOSCOPY N/A 03/17/2013   Procedure: COLONOSCOPY;  Surgeon: Juanita Craver, MD;  Location: [facility];  Service: Endoscopy;  Laterality: N/A;   EYE SURGERY     FOOT ARTHRODESIS Right 07/13/2017   Procedure: ARTHRODESIS FOOT-MULTI.FUSIONS (6  JOINTS);  Surgeon: Albertine Patricia, DPM;  Location: [facility];  Service: Podiatry;  Laterality: Right;   IRRIGATION AND DEBRIDEMENT FOOT Right 08/09/2017   Procedure: IRRIGATION AND DEBRIDEMENT FOOT;  Surgeon: Albertine Patricia, DPM;  Location: [facility];  Service: Podiatry;  Laterality: Right;   KNEE ARTHROSCOPY     bilateral   LEFT HEART CATH AND CORONARY ANGIOGRAPHY N/A 06/04/2020   Procedure: LEFT HEART CATH AND CORONARY ANGIOGRAPHY;  Surgeon: Wellington Hampshire, MD;  Location: [facility];  Service: Cardiovascular;  Laterality: N/A;   REVERSE SHOULDER ARTHROPLASTY Right 11/16/2020   Procedure: REVERSE SHOULDER ARTHROPLASTY;  Surgeon: Corky Mull, MD;  Location: [facility];  Service: Orthopedics;  Laterality: Right;   SHOULDER CLOSED REDUCTION Right 11/11/2020   Procedure: CLOSED REDUCTION SHOULDER;  Surgeon: Corky Mull, MD;  Location: [facility];  Service: Orthopedics;  Laterality: Right;   SHOULDER INJECTION Right 11/11/2020   Procedure: SHOULDER INJECTION;  Surgeon: Corky Mull, MD;  Location: [facility];  Service: Orthopedics;  Laterality: Right;   SINUSOTOMY     TOOTH EXTRACTION  12/2016   VAGINAL HYSTERECTOMY      Family History  Problem Relation Age of Onset   Tuberculosis Mother    Parkinsonism Mother    Emphysema Father        smoked   Arthritis Father    Cancer Sister    Diabetes type II Sister    Breast cancer Sister    Tuberculosis Sister    Breast cancer Maternal Aunt    Colon cancer Neg Hx    Esophageal cancer Neg Hx    Pancreatic cancer Neg Hx    Stomach cancer Neg Hx     Social History   Socioeconomic History   Marital status: Married    Spouse name: Not on file   Number of children: 1   Years of education: Not on file   Highest education level: Not on file  Occupational History   Occupation: Furniture conservator/restorer: [employer]    Comment: Retired 2007  Tobacco Use   Smoking status: Never    Passive exposure: Current   Smokeless tobacco: Never   Vaping Use   Vaping Use: Never used  Substance and Sexual Activity   Alcohol use: No   Drug use: No   Sexual activity: Not Currently  Other Topics Concern   Not on file  Social History Narrative   No living will   Daughter should be decision maker   Would accept resuscitation attempts--but no prolonged ventilator or tube feeds   Social Determinants of Health   Financial Resource Strain: Not on file  Food Insecurity: No Food Insecurity (07/08/2020)   Hunger Vital Sign    Worried About Running Out of Food in the Last Year: Never true    Ran Out of Food in the Last Year: Never true  Transportation Needs: No Transportation Needs (07/08/2020)   PRAPARE - Hydrologist (Medical): No    Lack of Transportation (Non-Medical): No  Physical Activity: Not on file  Stress: Not on file  Social Connections: Not on file  Intimate Partner Violence: Not on file   Review of Systems Appetite is so-so----would rather drink (tea, water, ginger ale) Some nausea      Objective:   Physical Exam Constitutional:      Appearance: Normal appearance.  Neurological:     Mental Status: She is alert.  Psychiatric:        Mood and Affect: Mood normal.        Behavior: Behavior normal.            Assessment & Plan:

## 2022-04-01 LAB — DRUG MONITORING, PANEL 8 WITH CONFIRMATION, URINE
6 Acetylmorphine: NEGATIVE ng/mL (ref <10)
Alcohol Metabolites: NEGATIVE ng/mL (ref <500)
Amphetamines: NEGATIVE ng/mL (ref <500)
Benzodiazepines: NEGATIVE ng/mL (ref <100)
Buprenorphine, Urine: NEGATIVE ng/mL (ref <5)
Cocaine Metabolite: NEGATIVE ng/mL (ref <150)
Codeine: NEGATIVE ng/mL (ref <50)
Creatinine: 79.4 mg/dL (ref >=20.0)
Hydrocodone: NEGATIVE ng/mL (ref <50)
Hydromorphone: NEGATIVE ng/mL (ref <50)
MDMA: NEGATIVE ng/mL (ref <500)
Marijuana Metabolite: NEGATIVE ng/mL (ref <20)
Morphine: NEGATIVE ng/mL (ref <50)
Norhydrocodone: NEGATIVE ng/mL (ref <50)
Noroxycodone: 1229 ng/mL — ABNORMAL HIGH (ref <50)
Opiates: NEGATIVE ng/mL (ref <100)
Oxidant: NEGATIVE ug/mL (ref <200)
Oxycodone: 687 ng/mL — ABNORMAL HIGH (ref <50)
Oxycodone: POSITIVE ng/mL — AB (ref <100)
Oxymorphone: 3661 ng/mL — ABNORMAL HIGH (ref <50)
pH: 7.1 (ref 4.5–9.0)

## 2022-04-01 LAB — DM TEMPLATE

## 2022-04-07 DIAGNOSIS — L89152 Pressure ulcer of sacral region, stage 2: Secondary | ICD-10-CM | POA: Diagnosis not present

## 2022-04-07 DIAGNOSIS — M12812 Other specific arthropathies, not elsewhere classified, left shoulder: Secondary | ICD-10-CM | POA: Diagnosis not present

## 2022-04-07 DIAGNOSIS — M75122 Complete rotator cuff tear or rupture of left shoulder, not specified as traumatic: Secondary | ICD-10-CM | POA: Diagnosis not present

## 2022-04-07 DIAGNOSIS — S46102A Unspecified injury of muscle, fascia and tendon of long head of biceps, left arm, initial encounter: Secondary | ICD-10-CM | POA: Diagnosis not present

## 2022-04-12 ENCOUNTER — Other Ambulatory Visit: Payer: Self-pay | Admitting: Internal Medicine

## 2022-04-12 DIAGNOSIS — I480 Paroxysmal atrial fibrillation: Secondary | ICD-10-CM

## 2022-04-12 DIAGNOSIS — I10 Essential (primary) hypertension: Secondary | ICD-10-CM

## 2022-04-14 ENCOUNTER — Encounter: Payer: Self-pay | Admitting: Internal Medicine

## 2022-04-14 ENCOUNTER — Ambulatory Visit (INDEPENDENT_AMBULATORY_CARE_PROVIDER_SITE_OTHER): Payer: Medicare Other | Admitting: Internal Medicine

## 2022-04-14 VITALS — BP 120/78 | HR 47 | Temp 97.4°F | Ht 63.0 in | Wt 254.0 lb

## 2022-04-14 DIAGNOSIS — R1013 Epigastric pain: Secondary | ICD-10-CM

## 2022-04-14 DIAGNOSIS — I2510 Atherosclerotic heart disease of native coronary artery without angina pectoris: Secondary | ICD-10-CM

## 2022-04-14 DIAGNOSIS — H5789 Other specified disorders of eye and adnexa: Secondary | ICD-10-CM | POA: Insufficient documentation

## 2022-04-14 MED ORDER — OMEPRAZOLE 40 MG PO CPDR: 40.0000 mg | DELAYED_RELEASE_CAPSULE | Freq: Every day | ORAL | 3 refills | Status: DC

## 2022-04-14 NOTE — Assessment & Plan Note (Signed)
Might have just been tear duct related Better with warm compresses No infection No action for now

## 2022-04-14 NOTE — Assessment & Plan Note (Signed)
Had Rx for H pylori a year ago Similar symptoms EGD was deferred then I am concerned about empiric antibiotics now---will just try the omeprazole 3m daily at bedtime Back to Dr AHavery Morosif it persists

## 2022-04-14 NOTE — Progress Notes (Signed)
Subjective:    Patient ID: Tina Pacheco, female    DOB: 1950/07/13, 72 y.o.   MRN: [id number]  HPI Here with daughter with several concerns  Having some swelling in the inside of the right eye Feels a lump there Has been using warm compresses Not really red--just swollen No vision changes  Headache and some nausea Not really new Feels "like butterflies jumping"--not related to eating Keeps her up at times---needs to go to bathroom (nocturia)  Gets sense of heaviness in chest  Current Outpatient Medications on File Prior to Visit  Medication Sig Dispense Refill   amiodarone (PACERONE) 200 MG tablet TAKE 1 TABLET(200 MG) BY MOUTH DAILY 30 tablet 3   apixaban (ELIQUIS) 5 MG TABS tablet Take 1 tablet (5 mg total) by mouth 2 (two) times daily. 180 tablet 1   atorvastatin (LIPITOR) 40 MG tablet TAKE 1 TABLET(40 MG) BY MOUTH DAILY 90 tablet 3   Certolizumab Pegol (CIMZIA [place]) Inject into the skin. Patient states taking 1 x per month     ketoconazole (NIZORAL) 2 % cream APPLY A SMALL AMOUNT TO AFFECTED AREA ONCE A DAY TO RASH ON BUTTOCKS, GROIN 60 g 1   metolazone (ZAROXOLYN) 2.5 MG tablet Take 1 tablet (2.5 mg total) by mouth daily as needed. If home weight is over 280# 1 tablet 0   metoprolol succinate (TOPROL-XL) 50 MG 24 hr tablet TAKE 1 TABLET BY MOUTH EVERY DAY WITH OR IMMEDIATELY FOLLOWING A MEAL 90 tablet 3   montelukast (SINGULAIR) 10 MG tablet TAKE 1 TABLET BY MOUTH AT BEDTIME 90 tablet 3   nitroGLYCERIN (NITROSTAT) 0.4 MG SL tablet Place 1 tablet (0.4 mg total) under the tongue every 5 (five) minutes as needed for chest pain. 20 tablet 12   nystatin (MYCOSTATIN/NYSTOP) powder Apply 1 application topically 3 (three) times daily as needed. 60 g 0   ondansetron (ZOFRAN-ODT) 4 MG disintegrating tablet DISSOLVE 1 TABLET(4 MG) ON THE TONGUE EVERY 8 HOURS AS NEEDED FOR NAUSEA OR VOMITING 30 tablet 1   Oxycodone HCl 20 MG TABS Take 1 tablet (20 mg total) by mouth 2 (two) times daily  as needed. 60 tablet 0   polyethylene glycol (MIRALAX / GLYCOLAX) packet Take 17 g by mouth daily as needed for mild constipation. 14 each 0   Potassium Chloride 40 MEQ/15ML (20%) SOLN Take 7.5 mLs by mouth daily. 2250 mL 11   predniSONE (DELTASONE) 5 MG tablet Take 5 mg by mouth daily with breakfast.     spironolactone (ALDACTONE) 25 MG tablet TAKE 1 TABLET(25 MG) BY MOUTH DAILY 30 tablet 11   terazosin (HYTRIN) 10 MG capsule TAKE 1 CAPSULE(10 MG) BY MOUTH AT BEDTIME 90 capsule 3   torsemide (DEMADEX) 20 MG tablet Take 1 tablet (20 mg total) by mouth daily. 1 tablet 0   docusate sodium (COLACE) 100 MG capsule Take 1 capsule (100 mg total) by mouth 2 (two) times daily. (Patient not taking: Reported on 04/14/2022) 60 capsule 2   No current facility-administered medications on file prior to visit.    Allergies  Allergen Reactions   Fluoxetine Other (See Comments)    Headache, shaking, sleep issues Headache, shaking, sleep issues   Phenergan [Promethazine] Other (See Comments)    Sleepiness   Codeine     Nausea and vomiting/only when taking too much    Past Medical History:  Diagnosis Date   Anemia    Arthritis    RA   Asthma    Basal cell  carcinoma    Cataract 2018   bilateral eyes; corrected with surgery   Chronic diastolic CHF (congestive heart failure) (HCC)    Claustrophobia    Collagen vascular disease (HCC)    RA   COPD (chronic obstructive pulmonary disease) (HCC)    Diastolic dysfunction    a. echo 07/2014: EF 55-60%, no RWMA, GR2DD, mild MR, LA moderately dilated, PASP 38 mm Hg   Dyslipidemia    Headache    migraines   Hemihypertrophy    History of cardiac cath    a. 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 on Pradaxa; b. CHADSVASc at least 2 (HTN & female)   Rheumatoid arthritis(714.0)    Sleep  apnea    a. not compliant with CPAP   Stage 3 chronic kidney disease ([place])     Past Surgical History:  Procedure Laterality Date   ABDOMINAL HYSTERECTOMY     APPLICATION OF WOUND VAC Right 08/09/2017   Procedure: APPLICATION OF WOUND VAC;  Surgeon: Albertine Patricia, DPM;  Location: [facility];  Service: Podiatry;  Laterality: Right;   CARDIAC CATHETERIZATION  05/24/2010   nonobstructive CAD   CATARACT EXTRACTION W/ INTRAOCULAR LENS IMPLANT Right 06/12/2016   Dr. Darleen Crocker   CATARACT EXTRACTION W/ INTRAOCULAR LENS IMPLANT Left 06/26/2016   Dr. Darleen Crocker   CESAREAN SECTION     CHOLECYSTECTOMY     COLONOSCOPY  06/12/2011   Procedure: COLONOSCOPY;  Surgeon: Juanita Craver, MD;  [facility];  Service: Endoscopy;  Laterality: N/A;   COLONOSCOPY N/A 03/17/2013   Procedure: COLONOSCOPY;  Surgeon: Juanita Craver, MD;  [facility];  Service: Endoscopy;  Laterality: N/A;   EYE SURGERY     FOOT ARTHRODESIS Right 07/13/2017   Procedure: ARTHRODESIS FOOT-MULTI.FUSIONS (6 JOINTS);  Surgeon: Albertine Patricia, DPM;  Location: [facility];  Service: Podiatry;  Laterality: Right;   IRRIGATION AND DEBRIDEMENT FOOT Right 08/09/2017   Procedure: IRRIGATION AND DEBRIDEMENT FOOT;  Surgeon: Albertine Patricia, DPM;  Location: [facility];  Service: Podiatry;  Laterality: Right;   KNEE ARTHROSCOPY     bilateral   LEFT HEART CATH AND CORONARY ANGIOGRAPHY N/A 06/04/2020   Procedure: LEFT HEART CATH AND CORONARY ANGIOGRAPHY;  Surgeon: Wellington Hampshire, MD;  Location: [facility];  Service: Cardiovascular;  Laterality: N/A;   REVERSE SHOULDER ARTHROPLASTY Right 11/16/2020   Procedure: REVERSE SHOULDER ARTHROPLASTY;  Surgeon: Corky Mull, MD;  Location: [facility];  Service: Orthopedics;  Laterality: Right;   SHOULDER CLOSED REDUCTION Right 11/11/2020   Procedure: CLOSED REDUCTION SHOULDER;  Surgeon: Corky Mull, MD;  Location: [facility];  Service: Orthopedics;  Laterality: Right;   SHOULDER  INJECTION Right 11/11/2020   Procedure: SHOULDER INJECTION;  Surgeon: Corky Mull, MD;  Location: [facility];  Service: Orthopedics;  Laterality: Right;   SINUSOTOMY     TOOTH EXTRACTION  12/2016   VAGINAL HYSTERECTOMY      Family History  Problem Relation Age of Onset   Tuberculosis Mother    Parkinsonism Mother    Emphysema Father        smoked   Arthritis Father    Cancer Sister    Diabetes type II Sister    Breast cancer Sister    Tuberculosis Sister    Breast cancer Maternal Aunt  Colon cancer Neg Hx    Esophageal cancer Neg Hx    Pancreatic cancer Neg Hx    Stomach cancer Neg Hx     Social History   Socioeconomic History   Marital status: Married    Spouse name: Not on file   Number of children: 1   Years of education: Not on file   Highest education level: Not on file  Occupational History   Occupation: Taxpayer service    Employer: [employer]    Comment: Retired 2007  Tobacco Use   Smoking status: Never    Passive exposure: Current   Smokeless tobacco: Never  Vaping Use   Vaping Use: Never used  Substance and Sexual Activity   Alcohol use: No   Drug use: No   Sexual activity: Not Currently  Other Topics Concern   Not on file  Social History Narrative   No living will   Daughter should be decision maker   Would accept resuscitation attempts--but no prolonged ventilator or tube feeds   Social Determinants of Health   Financial Resource Strain: Not on file  Food Insecurity: No Food Insecurity (07/08/2020)   Hunger Vital Sign    Worried About Running Out of Food in the Last Year: Never true    Ran Out of Food in the Last Year: Never true  Transportation Needs: No Transportation Needs (07/08/2020)   PRAPARE - Hydrologist (Medical): No    Lack of Transportation (Non-Medical): No  Physical Activity: Not on file  Stress: Not on file  Social Connections: Not on file  Intimate Partner Violence: Not on file   Review of  Systems Some constipation--miralax does help but occasionally too much (1-2 doses per day of 1/4 capful) No fever No sig cough    Objective:   Physical Exam Constitutional:      Appearance: Normal appearance.  Eyes:     Conjunctiva/sclera: Conjunctivae normal.     Pupils: Pupils are equal, round, and reactive to light.  Cardiovascular:     Rate and Rhythm: Normal rate and regular rhythm.     Heart sounds: No murmur heard.    No gallop.  Pulmonary:     Effort: Pulmonary effort is normal.     Breath sounds: Normal breath sounds. No wheezing or rales.  Abdominal:     Palpations: Abdomen is soft.     Tenderness: There is no guarding.     Comments: Mild epigastric tenderness  Musculoskeletal:     Cervical back: Neck supple.     Right lower leg: No edema.     Left lower leg: No edema.  Lymphadenopathy:     Cervical: No cervical adenopathy.  Neurological:     Mental Status: She is alert.            Assessment & Plan:

## 2022-04-18 ENCOUNTER — Other Ambulatory Visit: Payer: Self-pay | Admitting: Internal Medicine

## 2022-04-18 NOTE — Telephone Encounter (Signed)
Prescription Request  04/18/2022  Is this a "Controlled Substance" medicine? No  LOV: 04/14/2022  What is the name of the medication or equipment? Oxycodone HCl 20 MG TABS   Have you contacted your pharmacy to request a refill? No   Which pharmacy would you like this sent to?  [id number] - [place], [place] - [address] [place] [address] Phone: [phone] Fax: [phone]     Patient notified that their request is being sent to the clinical staff for review and that they should receive a response within 2 business days.   Please advise at Mobile [phone] (mobile)

## 2022-04-19 MED ORDER — OXYCODONE HCL 20 MG PO TABS: 1.0000 | ORAL_TABLET | Freq: Two times a day (BID) | ORAL | 0 refills | Status: DC | PRN

## 2022-04-19 NOTE — Addendum Note (Signed)
Addended by: Pilar Grammes on: 04/19/2022 09:28 AM   Modules accepted: Orders

## 2022-04-19 NOTE — Telephone Encounter (Signed)
Name of Medication: oxycodone 20 mg Name of [facility]acy: [facility] [address] Last Fill or Written Date and Quantity: 03-21-22 #60 Last Office Visit and Type: 04-14-22 Next Office Visit and Type: 06-27-22 Last Controlled Substance Agreement Date: 11/04/2020 Last UDS:04/20/21

## 2022-04-19 NOTE — Addendum Note (Signed)
Addended by: Viviana Simpler I on: 04/19/2022 10:12 AM   Modules accepted: Orders

## 2022-05-02 DIAGNOSIS — M0579 Rheumatoid arthritis with rheumatoid factor of multiple sites without organ or systems involvement: Secondary | ICD-10-CM | POA: Diagnosis not present

## 2022-05-05 IMAGING — XA DG C-ARM 1-60 MIN
1 series · 2 of 2 positions shown · non-contrast
Comparison: 11/10/2020.

CLINICAL DATA: surgery

EXAM:
DG C-ARM 1-60 MIN; RIGHT HUMERUS - 2+ VIEW
FLUOROSCOPY TIME:  Fluoroscopy Time:  33 seconds.
Radiation Exposure Index (if provided by the fluoroscopic device):
5.65 mGy.
Number of Acquired Spot Images: 2

[Series 1: dg no report - auto finalize · 0.20mm/px · 2 of 2 slices shown]
[im 1/2]
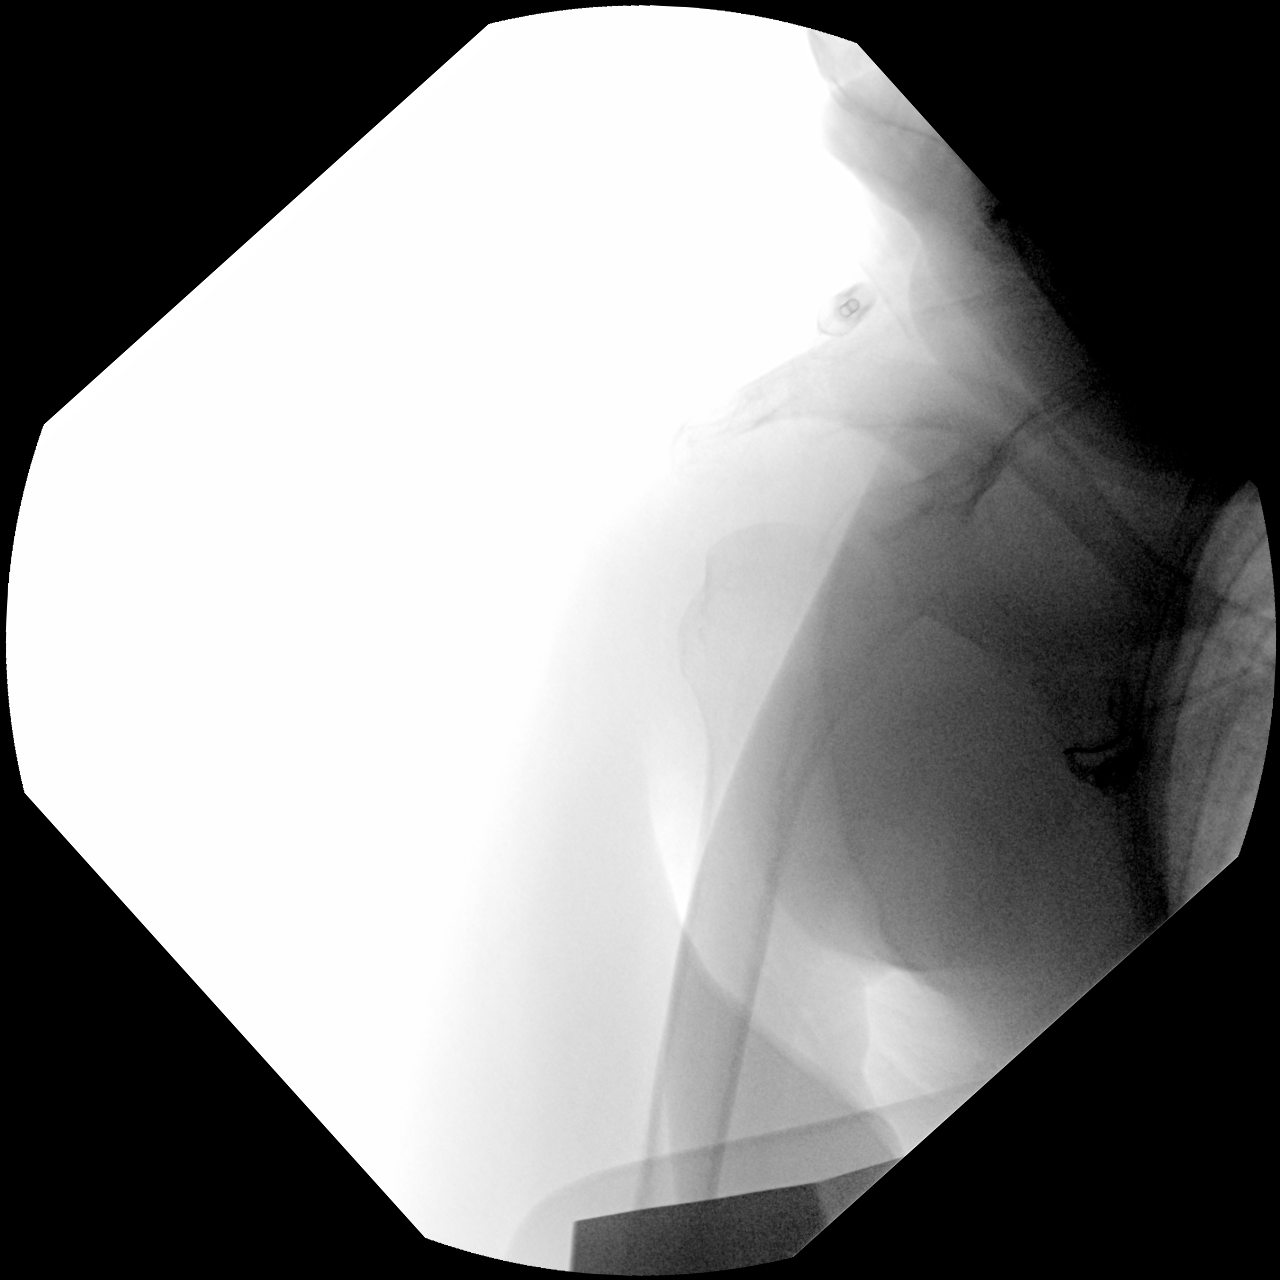
[im 2/2]
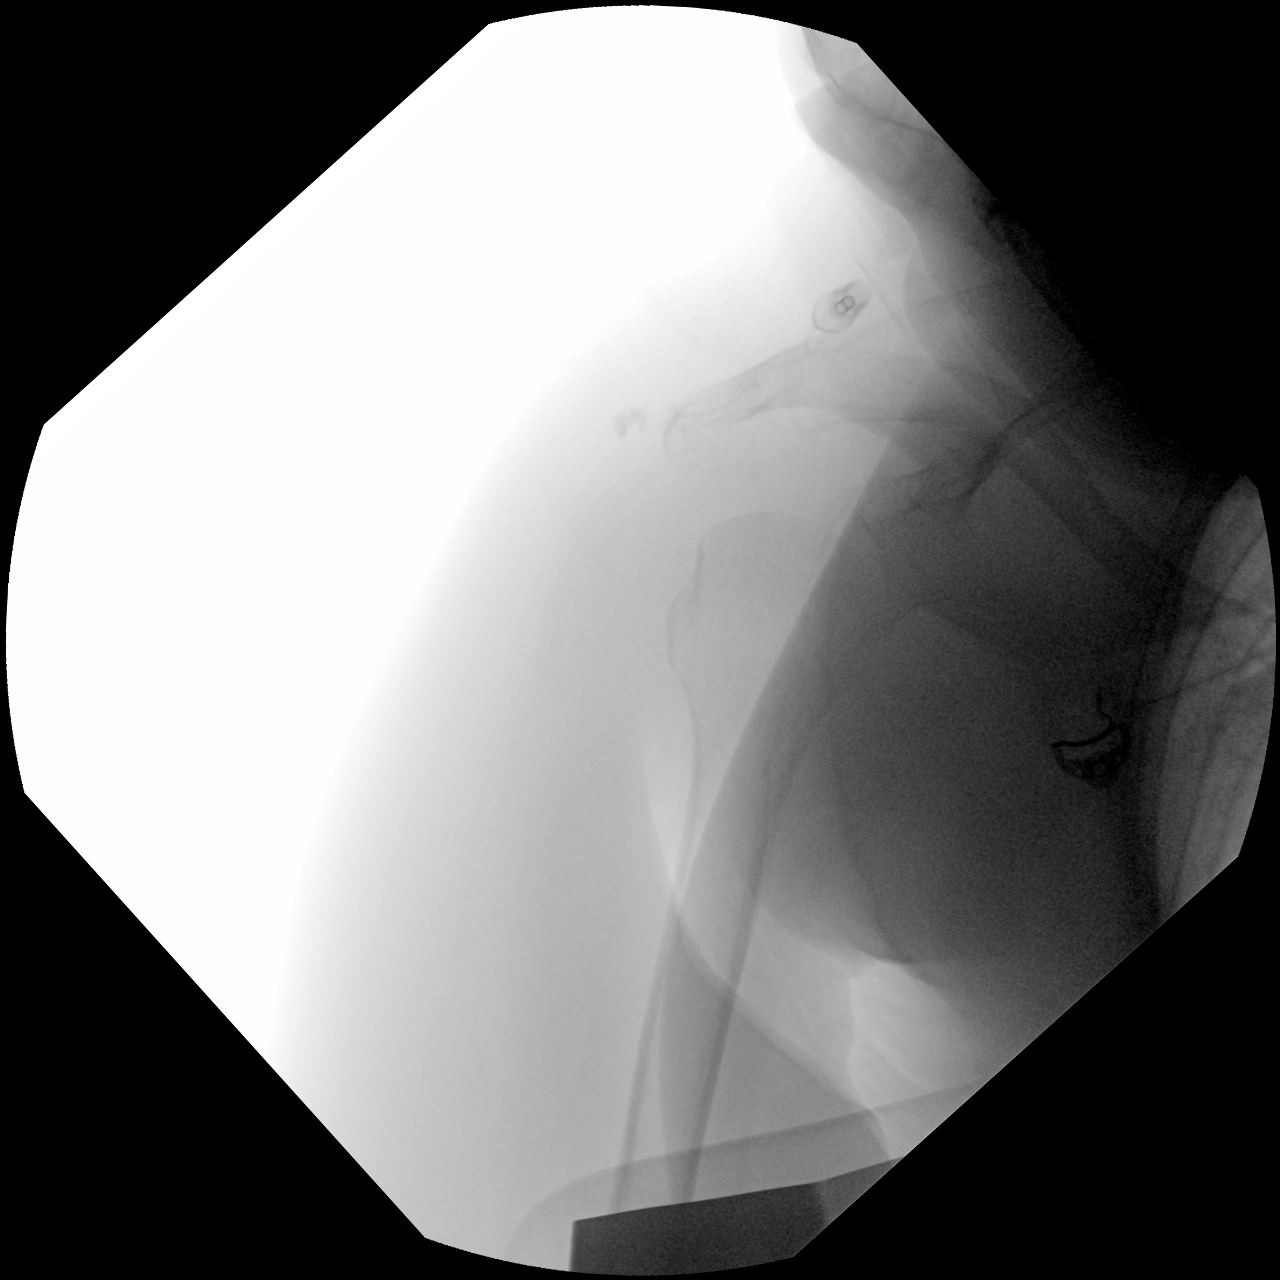

[2 of 2 positions shown; findings below may reference images not displayed]

FINDINGS: Two C-arm fluoroscopic images were obtained intraoperatively and
submitted for post operative interpretation. Alignment appears
improved when compared to 11/10/2020 radiographs; however, limited
assessment of alignment due to single provided projection. Please
see the performing provider's procedural report for further detail.
IMPRESSION: 1. Intraoperative fluoroscopy during close reduction of right
shoulder dislocation. Alignment appears improved when compared to
11/10/2020 radiographs; however, limited assessment of alignment due
to single provided projection. Dedicated shoulder radiographs could
confirm reduction if clinically indicated.
2. Please see recent CT of the shoulder for characterization of
impaction fracture.

## 2022-05-05 IMAGING — XA DG HUMERUS 2V *R*
1 series · 2 of 2 positions shown · non-contrast
Comparison: 11/10/2020.

CLINICAL DATA: surgery

EXAM:
DG C-ARM 1-60 MIN; RIGHT HUMERUS - 2+ VIEW
FLUOROSCOPY TIME:  Fluoroscopy Time:  33 seconds.
Radiation Exposure Index (if provided by the fluoroscopic device):
5.65 mGy.
Number of Acquired Spot Images: 2

[Series 1: dg no report - auto finalize · 0.20mm/px · 2 of 2 slices shown]
[im 1/2]
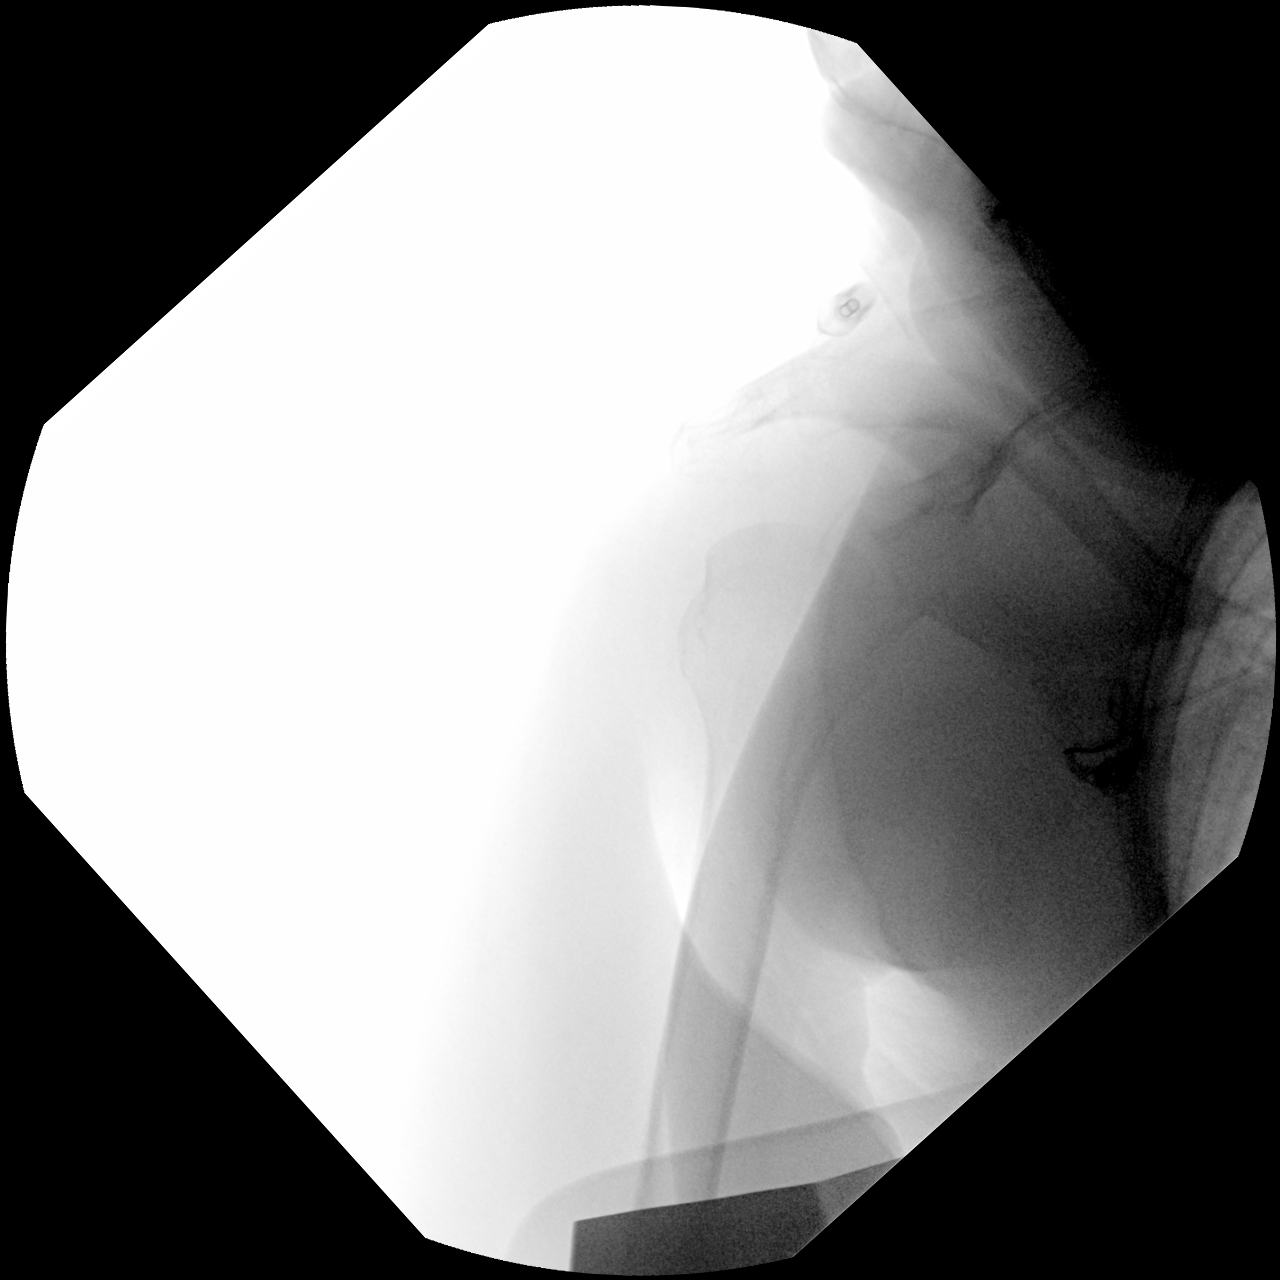
[im 2/2]
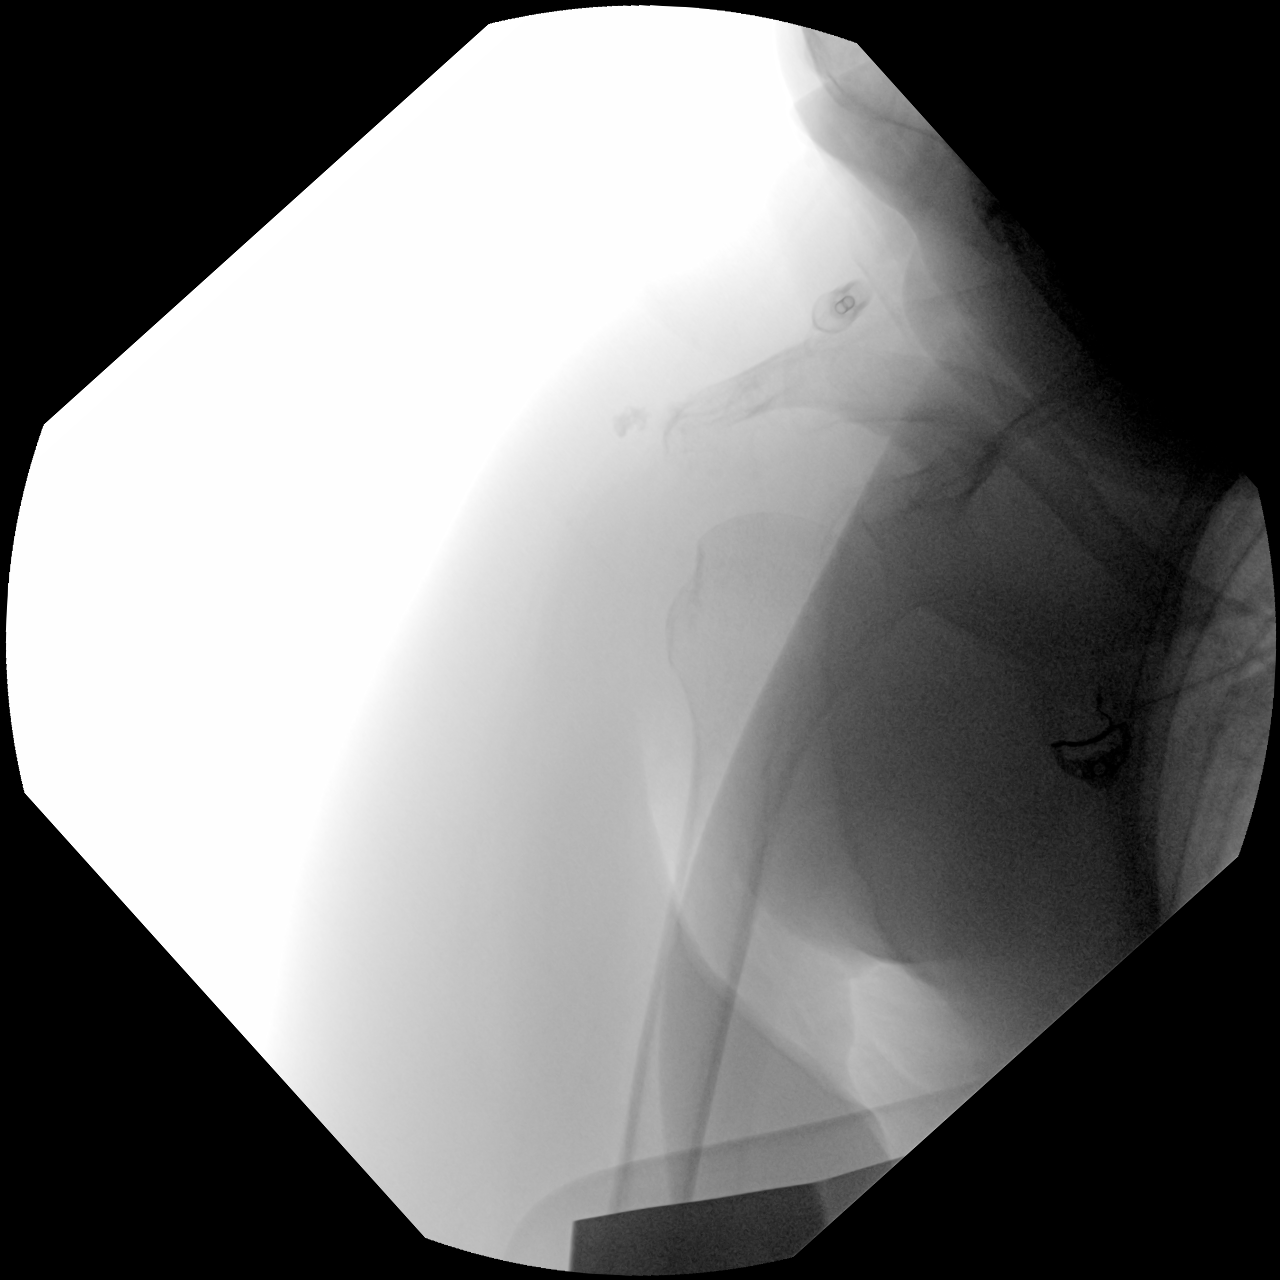

[2 of 2 positions shown; findings below may reference images not displayed]

FINDINGS: Two C-arm fluoroscopic images were obtained intraoperatively and
submitted for post operative interpretation. Alignment appears
improved when compared to 11/10/2020 radiographs; however, limited
assessment of alignment due to single provided projection. Please
see the performing provider's procedural report for further detail.
IMPRESSION: 1. Intraoperative fluoroscopy during close reduction of right
shoulder dislocation. Alignment appears improved when compared to
11/10/2020 radiographs; however, limited assessment of alignment due
to single provided projection. Dedicated shoulder radiographs could
confirm reduction if clinically indicated.
2. Please see recent CT of the shoulder for characterization of
impaction fracture.

## 2022-05-07 IMAGING — DX DG CHEST 1V PORT
1 series · 1 of 1 positions shown · non-contrast
Comparison: May 28, 2020

CLINICAL DATA: Cough.

EXAM:
PORTABLE CHEST 1 VIEW

[chest ap]
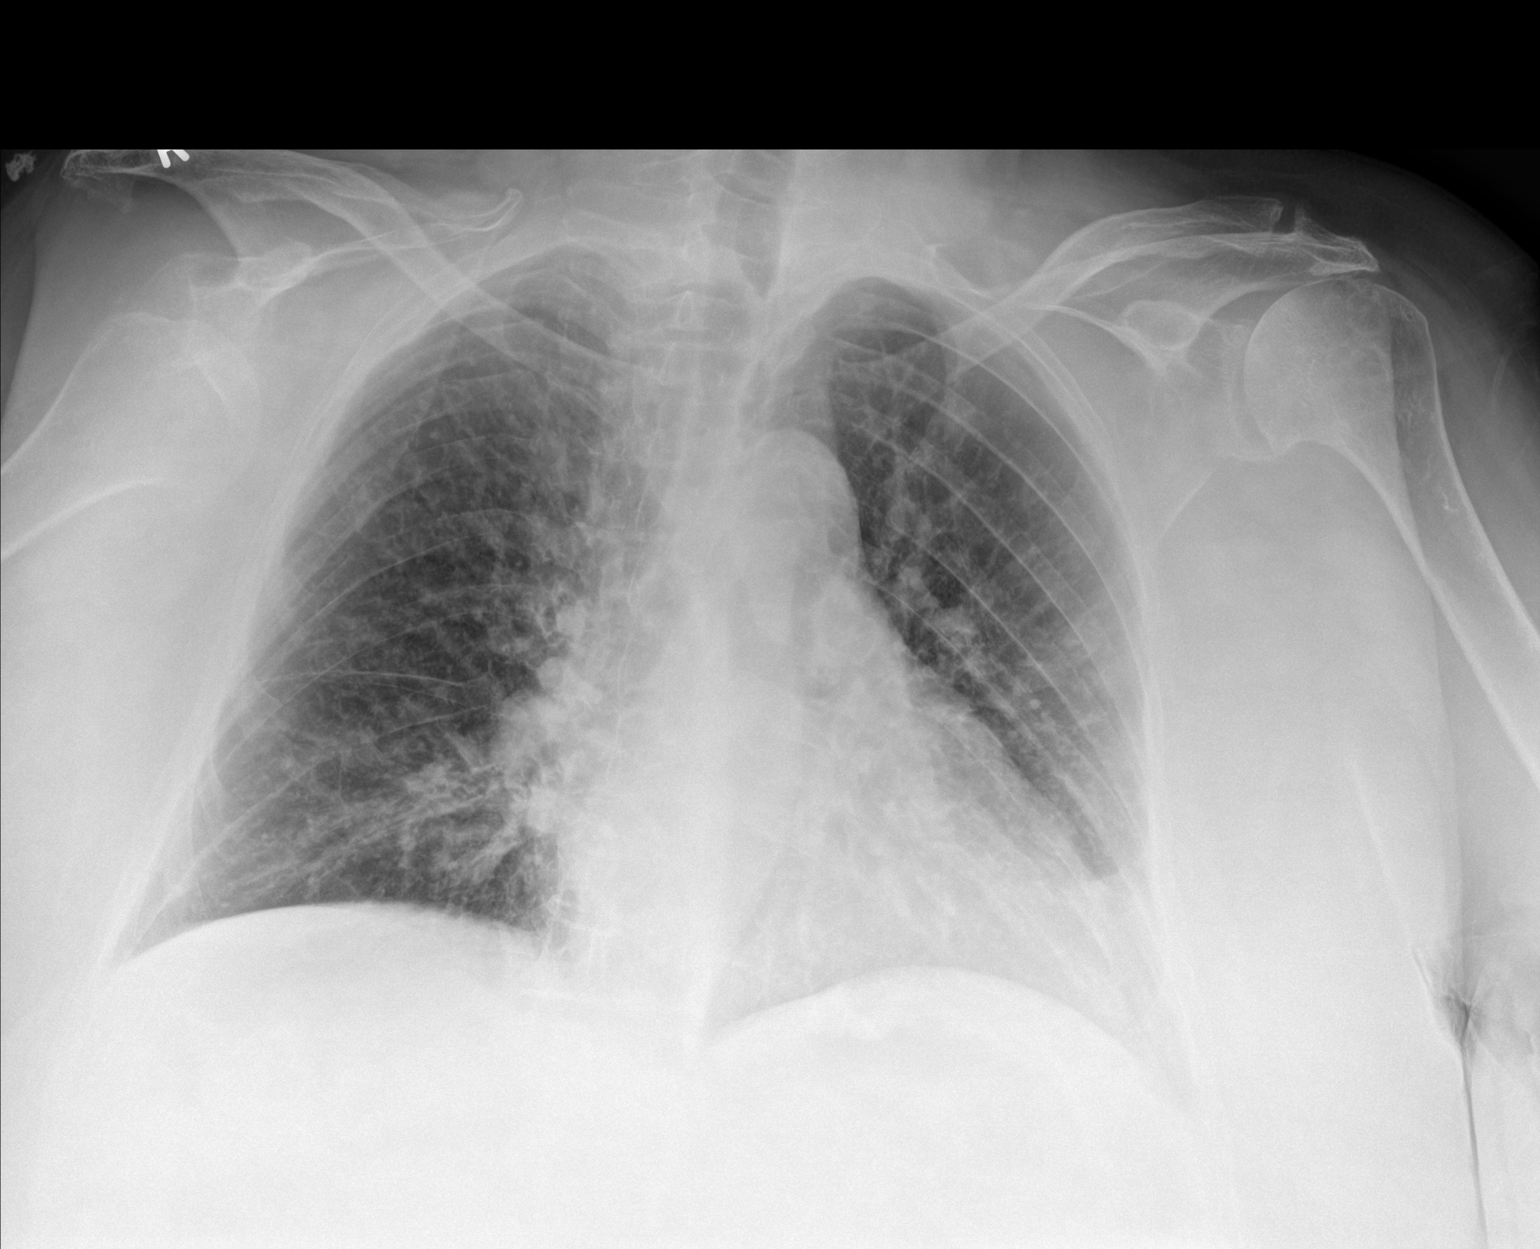

[1 of 1 positions shown; findings below may reference images not displayed]

FINDINGS: Mild opacity in the left mid lung suspected. The heart, hila,
mediastinum, and pleura are normal. No other acute abnormalities.
IMPRESSION: Suspected mild opacity in left mid lung may represent subtle
pneumonia. Recommend short-term follow-up imaging after treatment.

## 2022-05-15 ENCOUNTER — Telehealth: Payer: Self-pay | Admitting: Internal Medicine

## 2022-05-15 NOTE — Telephone Encounter (Signed)
Patient was seen on 04/14/2022 and prescribed   omeprazole (PRILOSEC) 40 MG capsule,she called in today stating that this medication is not helping her,she is still vomiting and having nausea.

## 2022-05-15 NOTE — Telephone Encounter (Signed)
Called and spoke to patient.  Scheduled her for an appointment with J Zehr this Wed, 3-20 as Dr. Doyne Keel next opening is 4-25. She will check with her daughter who will need to drive her and will call back if she can't keep that appointment.

## 2022-05-15 NOTE — Telephone Encounter (Signed)
Sure thing, sorry to hear she is not doing well. Jan can you help book her for a follow up with me in the office? Thanks

## 2022-05-17 ENCOUNTER — Encounter: Payer: Self-pay | Admitting: Gastroenterology

## 2022-05-17 ENCOUNTER — Ambulatory Visit (INDEPENDENT_AMBULATORY_CARE_PROVIDER_SITE_OTHER): Payer: Medicare Other | Admitting: Gastroenterology

## 2022-05-17 VITALS — BP 124/70 | HR 66 | Ht 63.5 in | Wt 234.0 lb

## 2022-05-17 DIAGNOSIS — R112 Nausea with vomiting, unspecified: Secondary | ICD-10-CM

## 2022-05-17 MED ORDER — PROMETHAZINE HCL 12.5 MG PO TABS: 12.5000 mg | ORAL_TABLET | Freq: Four times a day (QID) | ORAL | 1 refills | Status: DC | PRN

## 2022-05-17 NOTE — Progress Notes (Signed)
05/17/2022 Tina Pacheco [id number] 07-02-1950   HISTORY OF PRESENT ILLNESS: This is a 72 year old female with multiple medical problems including history of diastolic heart failure, COPD, morbid obesity, atrial fibrillation on Eliquis, and immobility who is a patient of Dr. Doyne Keel.  She was last seen here on 07/12/2021.  Please see that office note for further details.  She has had recurrent issues with nausea.  When she was seen on 07/12/2021 things seemed to be improving.  She has had extensive workup including CT scan, labs, H. pylori testing, upper GI series, etc. that have been unremarkable previously.  Apparently from that last visit until just a week ago she was doing well with only occasional symptoms.  Now for the past week has had persistent symptoms of nausea and retching.  Says that this happens whether she eats or not.  She says that she just brings up foam/phlegm.  Says that she has had a headache and cannot sleep.  Her daughter reports that she has a very nervous stomach with a lot of stuff going on.  She is supposed to have shoulder surgery, etc.  Says that once she gets into one of the cycle is very hard for her to break it and get out of it.  Patient states she is not able to eat, but then yesterday she said that she did eat a chicken leg and kept it down.  Zofran is not helping.  They are asking for something else for nausea, she would like to try phenergan again now that she is off of meclizine, gabapentin, and is only on very low-dose of oxycodone (had been hospitalized last year with altered mental status thought to be due to polypharmacy with meclizine, gabapentin, oxycodone, and Phenergan).  Past Medical History:  Diagnosis Date   Anemia    Arthritis    RA   Asthma    Basal cell carcinoma    Cataract 2018   bilateral eyes; corrected with surgery   Chronic diastolic CHF (congestive heart failure) (HCC)    Claustrophobia    Collagen vascular disease (HCC)    RA    COPD (chronic obstructive pulmonary disease) (HCC)    Diastolic dysfunction    a. echo 07/2014: EF 55-60%, no RWMA, GR2DD, mild MR, LA moderately dilated, PASP 38 mm Hg   Dyslipidemia    Headache    migraines   Hemihypertrophy    History of cardiac cath    a. cardiac cath 05/24/2010 - nonobstructive CAD   History of gout    Hyperplastic colonic polyp 2003   Hypertension    Hypokalemia    Morbid obesity ([place])    PAF (paroxysmal atrial fibrillation) ([place])    a. on Pradaxa; b. CHADSVASc at least 2 (HTN & female)   Rheumatoid arthritis(714.0)    Sleep apnea    a. not compliant with CPAP   Stage 3 chronic kidney disease ([place])    Past Surgical History:  Procedure Laterality Date   ABDOMINAL HYSTERECTOMY     APPLICATION OF WOUND VAC Right 08/09/2017   Procedure: APPLICATION OF WOUND VAC;  Surgeon: Albertine Patricia, DPM;  Location: [facility];  Service: Podiatry;  Laterality: Right;   CARDIAC CATHETERIZATION  05/24/2010   nonobstructive CAD   CATARACT EXTRACTION W/ INTRAOCULAR LENS IMPLANT Right 06/12/2016   Dr. Darleen Crocker   CATARACT EXTRACTION W/ INTRAOCULAR LENS IMPLANT Left 06/26/2016   Dr. Darleen Crocker   CESAREAN SECTION     CHOLECYSTECTOMY  COLONOSCOPY  06/12/2011   Procedure: COLONOSCOPY;  Surgeon: Juanita Craver, MD;  Location: [facility];  Service: Endoscopy;  Laterality: N/A;   COLONOSCOPY N/A 03/17/2013   Procedure: COLONOSCOPY;  Surgeon: Juanita Craver, MD;  Location: [facility];  Service: Endoscopy;  Laterality: N/A;   EYE SURGERY     FOOT ARTHRODESIS Right 07/13/2017   Procedure: ARTHRODESIS FOOT-MULTI.FUSIONS (6 JOINTS);  Surgeon: Albertine Patricia, DPM;  Location: [facility];  Service: Podiatry;  Laterality: Right;   IRRIGATION AND DEBRIDEMENT FOOT Right 08/09/2017   Procedure: IRRIGATION AND DEBRIDEMENT FOOT;  Surgeon: Albertine Patricia, DPM;  Location: [facility];  Service: Podiatry;  Laterality: Right;   KNEE ARTHROSCOPY     bilateral   LEFT HEART CATH AND CORONARY  ANGIOGRAPHY N/A 06/04/2020   Procedure: LEFT HEART CATH AND CORONARY ANGIOGRAPHY;  Surgeon: Wellington Hampshire, MD;  Location: [facility];  Service: Cardiovascular;  Laterality: N/A;   REVERSE SHOULDER ARTHROPLASTY Right 11/16/2020   Procedure: REVERSE SHOULDER ARTHROPLASTY;  Surgeon: Corky Mull, MD;  Location: [facility];  Service: Orthopedics;  Laterality: Right;   SHOULDER CLOSED REDUCTION Right 11/11/2020   Procedure: CLOSED REDUCTION SHOULDER;  Surgeon: Corky Mull, MD;  Location: [facility];  Service: Orthopedics;  Laterality: Right;   SHOULDER INJECTION Right 11/11/2020   Procedure: SHOULDER INJECTION;  Surgeon: Corky Mull, MD;  Location: [facility];  Service: Orthopedics;  Laterality: Right;   SINUSOTOMY     TOOTH EXTRACTION  12/2016   VAGINAL HYSTERECTOMY      reports that she has never smoked. She has been exposed to tobacco smoke. She has never used smokeless tobacco. She reports that she does not drink alcohol and does not use drugs. family history includes Arthritis in her father; Breast cancer in her maternal aunt and sister; Cancer in her sister; Diabetes type II in her sister; Emphysema in her father; Parkinsonism in her mother; Tuberculosis in her mother and sister. Allergies  Allergen Reactions   Fluoxetine Other (See Comments)    Headache, shaking, sleep issues Headache, shaking, sleep issues   Phenergan [Promethazine] Other (See Comments)    Sleepiness   Codeine     Nausea and vomiting/only when taking too much      Outpatient Encounter Medications as of 05/17/2022  Medication Sig   amiodarone (PACERONE) 200 MG tablet TAKE 1 TABLET(200 MG) BY MOUTH DAILY   apixaban (ELIQUIS) 5 MG TABS tablet Take 1 tablet (5 mg total) by mouth 2 (two) times daily.   atorvastatin (LIPITOR) 40 MG tablet TAKE 1 TABLET(40 MG) BY MOUTH DAILY   Certolizumab Pegol (CIMZIA [place]) Inject into the skin. Patient states taking 1 x per month   docusate sodium (COLACE) 100 MG capsule Take 1  capsule (100 mg total) by mouth 2 (two) times daily.   ketoconazole (NIZORAL) 2 % cream APPLY A SMALL AMOUNT TO AFFECTED AREA ONCE A DAY TO RASH ON BUTTOCKS, GROIN   metolazone (ZAROXOLYN) 2.5 MG tablet Take 1 tablet (2.5 mg total) by mouth daily as needed. If home weight is over 280#   metoprolol succinate (TOPROL-XL) 50 MG 24 hr tablet TAKE 1 TABLET BY MOUTH EVERY DAY WITH OR IMMEDIATELY FOLLOWING A MEAL   montelukast (SINGULAIR) 10 MG tablet TAKE 1 TABLET BY MOUTH AT BEDTIME   nitroGLYCERIN (NITROSTAT) 0.4 MG SL tablet Place 1 tablet (0.4 mg total) under the tongue every 5 (five) minutes as needed for chest pain.   nystatin (MYCOSTATIN/NYSTOP) powder Apply 1 application topically 3 (three) times  daily as needed.   omeprazole (PRILOSEC) 40 MG capsule Take 1 capsule (40 mg total) by mouth at bedtime.   ondansetron (ZOFRAN-ODT) 4 MG disintegrating tablet DISSOLVE 1 TABLET(4 MG) ON THE TONGUE EVERY 8 HOURS AS NEEDED FOR NAUSEA OR VOMITING   Oxycodone HCl 20 MG TABS Take 1 tablet (20 mg total) by mouth 2 (two) times daily as needed.   polyethylene glycol (MIRALAX / GLYCOLAX) packet Take 17 g by mouth daily as needed for mild constipation.   Potassium Chloride 40 MEQ/15ML (20%) SOLN Take 7.5 mLs by mouth daily.   predniSONE (DELTASONE) 5 MG tablet Take 5 mg by mouth daily with breakfast.   promethazine (PHENERGAN) 12.5 MG tablet Take 1 tablet (12.5 mg total) by mouth every 6 (six) hours as needed for nausea or vomiting.   spironolactone (ALDACTONE) 25 MG tablet TAKE 1 TABLET(25 MG) BY MOUTH DAILY   terazosin (HYTRIN) 10 MG capsule TAKE 1 CAPSULE(10 MG) BY MOUTH AT BEDTIME   torsemide (DEMADEX) 20 MG tablet Take 1 tablet (20 mg total) by mouth daily.   No facility-administered encounter medications on file as of 05/17/2022.     REVIEW OF SYSTEMS  : All other systems reviewed and negative except where noted in the History of Present Illness.   PHYSICAL EXAM: BP 124/70   Pulse 66   Ht 5' 3.5"  (1.613 m)   Wt 234 lb (106.1 kg)   BMI 40.80 kg/m  General:  Elderly white female in no acute distress; in wheelchair Head: Normocephalic and atraumatic Eyes:  Sclerae anicteric, conjunctiva pink. Ears: Normal auditory acuity Lungs: Clear throughout to auscultation; no W/R/R. Heart: Regular rate and rhythm; no M/R/G. Musculoskeletal: Symmetrical with no gross deformities  Skin: No lesions on visible extremities Extremities: No edema  Neurological: Alert oriented x 4, grossly non-focal Psychological:  Alert and cooperative. Normal mood and affect  ASSESSMENT AND PLAN: *Nausea and vomiting: This is a recurrent issue.  Was last seen in May 2023 at which point she was improving.  Since then until about a week or 2 ago she had just had intermittent symptoms.  Now ongoing symptoms for the past week.  Daughter reports very nervous stomach, so likely psychogenic component.  She reports she has a lot going on, supposed to have shoulder surgery, etc.  She says that typically when she gets into one of these episodes it is hard to get her out of it.  Zofran is not helping.  They are asking if she can try Phenergan again.  That was taken out of her medications along with meclizine and gabapentin for altered mental status last year.  She is no longer on the meclizine or gabapentin and is only on very low doses of oxycodone so they are both asking for her to try the Phenergan again and are comfortable with that.  Will try 12.5 mg every 6-8 hours as needed.  Prescription sent to pharmacy.  I have asked her daughter to send [place] an update or call us with an update on Friday.  She thinks that if she can get her stomach calmed down and get out of the cycle then everything will be okay.  Previous workup for similar symptoms were unremarkable.  We again discussed endoscopy, but she wants to hold off for now.  CC:  Venia Carbon, MD

## 2022-05-17 NOTE — Patient Instructions (Signed)
We have sent the following medications to your pharmacy for you to pick up at your convenience: Phenergan  Call with an update on Friday  _______________________________________________________  If your blood pressure at your visit was 140/90 or greater, please contact your primary care physician to follow up on this.  _______________________________________________________  If you are age 73 or older, your body mass index should be between 23-30. Your Body mass index is 40.8 kg/m. If this is out of the aforementioned range listed, please consider follow up with your Primary Care Provider.  If you are age 42 or younger, your body mass index should be between 19-25. Your Body mass index is 40.8 kg/m. If this is out of the aformentioned range listed, please consider follow up with your Primary Care Provider.   ________________________________________________________  The [facility] providers would like to encourage you to use [facility] to communicate with providers for non-urgent requests or questions.  Due to long hold times on the telephone, sending your provider a message by [facility] may be a faster and more efficient way to get a response.  Please allow 48 business hours for a response.  Please remember that this is for non-urgent requests.  _______________________________________________________   I appreciate the  opportunity to care for you  Thank You   Janett Billow Zehr,PA-C

## 2022-05-17 NOTE — Progress Notes (Signed)
Agree with assessment and plan as outlined. Thanks for seeing her Jess. Agree with your plan. If does not improve can consider endoscopy however she given her comorbidities I think conservative measures first is reasonable.

## 2022-05-18 ENCOUNTER — Other Ambulatory Visit (HOSPITAL_COMMUNITY): Payer: Self-pay

## 2022-05-18 ENCOUNTER — Telehealth: Payer: Self-pay | Admitting: Pharmacy Technician

## 2022-05-18 NOTE — Telephone Encounter (Signed)
Patient Advocate Encounter  Received notification from [facility] that prior authorization for PROMETHAZINE 12.5MG  is required.   PA submitted on 3.21.24 [facility] Status is pending

## 2022-05-18 NOTE — Telephone Encounter (Signed)
Patient insurance does not cover Phenergan. Please advise.

## 2022-05-18 NOTE — Telephone Encounter (Signed)
Pharmacy Patient Advocate Encounter  Received notification from [facility] that the request for prior authorization for PROMETHAZINE has been denied due to .    Please be advised we currently do not have a Pharmacist to review denials, therefore you will need to process appeals accordingly as needed. Thanks for your support at this time.   You may call [phone] or fax [phone], to appeal.

## 2022-05-19 ENCOUNTER — Telehealth: Payer: Self-pay | Admitting: Gastroenterology

## 2022-05-19 NOTE — Telephone Encounter (Signed)
Inbound call from  patient want to speak with a nurse regarding medication Phenergan.Please advise

## 2022-05-19 NOTE — Telephone Encounter (Signed)
Patient states she already pick up Phenergan from pharmacy. She had to pay a little more than normal but was able to get it.

## 2022-05-19 NOTE — Telephone Encounter (Addendum)
Spoke with the patient. She said she has phenergan and is taking a half of a tablet every 6 hours or so. She has been able to eat. She has been able to take her potassium. She feels "a little better." Encouraged patient to keep [place] up to date on her progress.

## 2022-05-22 ENCOUNTER — Other Ambulatory Visit: Payer: Self-pay | Admitting: Internal Medicine

## 2022-05-22 MED ORDER — OXYCODONE HCL 20 MG PO TABS: 1.0000 | ORAL_TABLET | Freq: Two times a day (BID) | ORAL | 0 refills | Status: DC | PRN

## 2022-05-22 NOTE — Telephone Encounter (Signed)
Prescription Request  05/22/2022  LOV: 04/14/2022  What is the name of the medication or equipment?  Oxycodone HCl 20 MG TABS   Have you contacted your pharmacy to request a refill? No   Which pharmacy would you like this sent to?  [id number] - [place], [place] - [address] [place] [address] Phone: [phone] Fax: [phone]     Patient notified that their request is being sent to the clinical staff for review and that they should receive a response within 2 business days.   Please advise at [facility] [phone]

## 2022-05-22 NOTE — Telephone Encounter (Signed)
Name of Medication: oxycodone 20 mg Name of Pharmacy: [facility] [address] Last Fill or Written Date and Quantity: 04-19-22 #60 Last Office Visit and Type: 04-14-22 Next Office Visit and Type: 06-27-22 Last Controlled Substance Agreement Date: 03/30/22 Last UDS: 03/30/22

## 2022-05-30 DIAGNOSIS — M0579 Rheumatoid arthritis with rheumatoid factor of multiple sites without organ or systems involvement: Secondary | ICD-10-CM | POA: Diagnosis not present

## 2022-06-04 ENCOUNTER — Other Ambulatory Visit: Payer: Self-pay | Admitting: Cardiovascular Disease

## 2022-06-04 ENCOUNTER — Other Ambulatory Visit: Payer: Self-pay | Admitting: Internal Medicine

## 2022-06-04 DIAGNOSIS — E782 Mixed hyperlipidemia: Secondary | ICD-10-CM

## 2022-06-04 DIAGNOSIS — I480 Paroxysmal atrial fibrillation: Secondary | ICD-10-CM

## 2022-06-08 DIAGNOSIS — Z79899 Other long term (current) drug therapy: Secondary | ICD-10-CM | POA: Diagnosis not present

## 2022-06-08 DIAGNOSIS — M159 Polyosteoarthritis, unspecified: Secondary | ICD-10-CM | POA: Diagnosis not present

## 2022-06-08 DIAGNOSIS — M0579 Rheumatoid arthritis with rheumatoid factor of multiple sites without organ or systems involvement: Secondary | ICD-10-CM | POA: Diagnosis not present

## 2022-06-08 DIAGNOSIS — M1A09X0 Idiopathic chronic gout, multiple sites, without tophus (tophi): Secondary | ICD-10-CM | POA: Diagnosis not present

## 2022-06-14 ENCOUNTER — Other Ambulatory Visit: Payer: Self-pay | Admitting: Surgery

## 2022-06-19 ENCOUNTER — Encounter
Admission: RE | Admit: 2022-06-19 | Discharge: 2022-06-19 | Disposition: A | Discharge status: Home / Self Care | Payer: Medicare Other | Source: Ambulatory Visit | Attending: Surgery | Admitting: Surgery

## 2022-06-19 ENCOUNTER — Ambulatory Visit (INDEPENDENT_AMBULATORY_CARE_PROVIDER_SITE_OTHER): Payer: Medicare Other | Admitting: Internal Medicine

## 2022-06-19 ENCOUNTER — Telehealth: Payer: Self-pay | Admitting: *Deleted

## 2022-06-19 VITALS — BP 122/70 | HR 54 | Temp 97.1°F | Ht 63.5 in | Wt 254.0 lb

## 2022-06-19 DIAGNOSIS — F1120 Opioid dependence, uncomplicated: Secondary | ICD-10-CM | POA: Diagnosis not present

## 2022-06-19 DIAGNOSIS — R9431 Abnormal electrocardiogram [ECG] [EKG]: Secondary | ICD-10-CM | POA: Insufficient documentation

## 2022-06-19 DIAGNOSIS — Z01818 Encounter for other preprocedural examination: Secondary | ICD-10-CM | POA: Diagnosis not present

## 2022-06-19 DIAGNOSIS — G894 Chronic pain syndrome: Secondary | ICD-10-CM

## 2022-06-19 DIAGNOSIS — M353 Polymyalgia rheumatica: Secondary | ICD-10-CM

## 2022-06-19 DIAGNOSIS — Z01812 Encounter for preprocedural laboratory examination: Secondary | ICD-10-CM

## 2022-06-19 DIAGNOSIS — Z01810 Encounter for preprocedural cardiovascular examination: Secondary | ICD-10-CM | POA: Diagnosis not present

## 2022-06-19 HISTORY — DX: Migraine, unspecified, not intractable, without status migrainosus: G43.909

## 2022-06-19 HISTORY — DX: Other intervertebral disc degeneration, lumbosacral region without mention of lumbar back pain or lower extremity pain: M51.379

## 2022-06-19 HISTORY — DX: Gastro-esophageal reflux disease without esophagitis: K21.9

## 2022-06-19 HISTORY — DX: Atherosclerotic heart disease of native coronary artery without angina pectoris: I25.10

## 2022-06-19 HISTORY — DX: Other long term (current) drug therapy: Z79.899

## 2022-06-19 HISTORY — DX: Pneumonia, unspecified organism: J18.9

## 2022-06-19 HISTORY — DX: Unspecified osteoarthritis, unspecified site: M19.90

## 2022-06-19 HISTORY — DX: Long term (current) use of unspecified immunomodulators and immunosuppressants: Z79.60

## 2022-06-19 HISTORY — DX: Atherosclerosis of aorta: I70.0

## 2022-06-19 HISTORY — DX: Gout, unspecified: M10.9

## 2022-06-19 HISTORY — DX: Obstructive sleep apnea (adult) (pediatric): G47.33

## 2022-06-19 HISTORY — DX: Rheumatoid arthritis, unspecified: M06.9

## 2022-06-19 HISTORY — DX: Long term (current) use of anticoagulants: Z79.01

## 2022-06-19 HISTORY — DX: Other intervertebral disc degeneration, lumbosacral region: M51.37

## 2022-06-19 LAB — CBC WITH DIFFERENTIAL/PLATELET
Abs Immature Granulocytes: 0.02 10*3/uL (ref 0.00–0.07)
Basophils Absolute: 0 10*3/uL (ref 0.0–0.1)
Basophils Relative: 1 %
Eosinophils Absolute: 0.2 10*3/uL (ref 0.0–0.5)
Eosinophils Relative: 3 %
HCT: 36.3 % (ref 36.0–46.0)
Hemoglobin: 11.4 g/dL — ABNORMAL LOW (ref 12.0–15.0)
Immature Granulocytes: 0 %
Lymphocytes Relative: 25 %
Lymphs Abs: 1.6 10*3/uL (ref 0.7–4.0)
MCH: 29.1 pg (ref 26.0–34.0)
MCHC: 31.4 g/dL (ref 30.0–36.0)
MCV: 92.6 fL (ref 80.0–100.0)
Monocytes Absolute: 0.6 10*3/uL (ref 0.1–1.0)
Monocytes Relative: 9 %
Neutro Abs: 3.9 10*3/uL (ref 1.7–7.7)
Neutrophils Relative %: 62 %
Platelets: 204 10*3/uL (ref 150–400)
RBC: 3.92 MIL/uL (ref 3.87–5.11)
RDW: 14.4 % (ref 11.5–15.5)
WBC: 6.3 10*3/uL (ref 4.0–10.5)
nRBC: 0 % (ref 0.0–0.2)

## 2022-06-19 LAB — URINALYSIS, ROUTINE W REFLEX MICROSCOPIC
Bilirubin Urine: NEGATIVE
Glucose, UA: NEGATIVE mg/dL
Hgb urine dipstick: NEGATIVE
Ketones, ur: NEGATIVE mg/dL
Leukocytes,Ua: NEGATIVE
Nitrite: NEGATIVE
Protein, ur: NEGATIVE mg/dL
Specific Gravity, Urine: 1.013 (ref 1.005–1.030)
pH: 7 (ref 5.0–8.0)

## 2022-06-19 LAB — COMPREHENSIVE METABOLIC PANEL
ALT: 10 U/L (ref 0–44)
AST: 20 U/L (ref 15–41)
Albumin: 3.4 g/dL — ABNORMAL LOW (ref 3.5–5.0)
Alkaline Phosphatase: 112 U/L (ref 38–126)
Anion gap: 11 (ref 5–15)
BUN: 27 mg/dL — ABNORMAL HIGH (ref 8–23)
CO2: 27 mmol/L (ref 22–32)
Calcium: 9 mg/dL (ref 8.9–10.3)
Chloride: 101 mmol/L (ref 98–111)
Creatinine, Ser: 1.29 mg/dL — ABNORMAL HIGH (ref 0.44–1.00)
GFR, Estimated: 44 mL/min — ABNORMAL LOW (ref >60)
Glucose, Bld: 78 mg/dL (ref 70–99)
Potassium: 3.5 mmol/L (ref 3.5–5.1)
Sodium: 139 mmol/L (ref 135–145)
Total Bilirubin: 0.9 mg/dL (ref 0.3–1.2)
Total Protein: 6.7 g/dL (ref 6.5–8.1)

## 2022-06-19 LAB — TYPE AND SCREEN
ABO/RH(D): O POS
Antibody Screen: NEGATIVE

## 2022-06-19 LAB — SURGICAL PCR SCREEN
MRSA, PCR: NEGATIVE
Staphylococcus aureus: POSITIVE — AB

## 2022-06-19 MED ORDER — OXYCODONE HCL 20 MG PO TABS: 1.0000 | ORAL_TABLET | Freq: Two times a day (BID) | ORAL | 0 refills | Status: DC | PRN

## 2022-06-19 MED ORDER — PREDNISONE 5 MG PO TABS: 5.0000 mg | ORAL_TABLET | Freq: Every day | ORAL | 11 refills | Status: DC

## 2022-06-19 NOTE — Assessment & Plan Note (Signed)
Continues on low dose prednisone

## 2022-06-19 NOTE — Telephone Encounter (Signed)
I confirmed thru secure chat with Quentin Mulling, FNP that the surgery date is planned for 06/27/22, not as indicated on clearance form 06/17/22.

## 2022-06-19 NOTE — Assessment & Plan Note (Signed)
PDMP reviewed No concerns 

## 2022-06-19 NOTE — Assessment & Plan Note (Signed)
Has multiple issues including RA (sees rheum), OA, now with left shoulder surgery next week Also fibromyalgia Continues on the oxycodone up to   daily

## 2022-06-19 NOTE — Telephone Encounter (Signed)
Primary Cardiologist:Timothy Mariah Milling, MD   Preoperative team, please contact this patient and set up a phone call appointment for further preoperative risk assessment. Please obtain consent and complete medication review. Thank you for your help.   I confirm that guidance regarding antiplatelet and oral anticoagulation therapy has been completed and, if necessary, noted below.   Levi Aland, NP-C  06/19/2022, 12:14 PM [address] Office [phone] Fax [phone]

## 2022-06-19 NOTE — Progress Notes (Signed)
Subjective:    Patient ID: Tina Pacheco, female    DOB: [birth year], 72 y.o.   MRN: [id number]  HPI Here with daughter for follow up of chronic pain  Is scheduled for reverse total left shoulder now Still with problems with right--done in 2022 Will need therapy from both Hopes to go to inpatient rehab  Continues to use the oxycodone--she still cuts them and spreads the  dose over the day/night Still does ADLs---step in shower, dressing/bathroom independently Food is prepared--so mostly just microwaves Not doing any housework---even trouble loading dishwasher due to left hand issues. Had aide 3 times a week for housework Daughter with husband and child will be moving in with her---to provide care  Current Outpatient Medications on File Prior to Visit  Medication Sig Dispense Refill   amiodarone (PACERONE) 200 MG tablet TAKE 1 TABLET(200 MG) BY MOUTH DAILY 90 tablet 0   apixaban (ELIQUIS) 5 MG TABS tablet Take 1 tablet (5 mg total) by mouth 2 (two) times daily. 180 tablet 1   atorvastatin (LIPITOR) 40 MG tablet TAKE 1 TABLET(40 MG) BY MOUTH DAILY 90 tablet 3   Certolizumab Pegol (CIMZIA [place]) Inject into the skin. Patient states taking 1 x per month     docusate sodium (COLACE) 100 MG capsule Take 1 capsule (100 mg total) by mouth 2 (two) times daily. 60 capsule 2   metolazone (ZAROXOLYN) 2.5 MG tablet Take 1 tablet (2.5 mg total) by mouth daily as needed. If home weight is over 280# 1 tablet 0   metoprolol succinate (TOPROL-XL) 50 MG 24 hr tablet TAKE 1 TABLET BY MOUTH EVERY DAY WITH OR IMMEDIATELY FOLLOWING A MEAL 90 tablet 3   montelukast (SINGULAIR) 10 MG tablet TAKE 1 TABLET BY MOUTH AT BEDTIME 90 tablet 3   nitroGLYCERIN (NITROSTAT) 0.4 MG SL tablet Place 1 tablet (0.4 mg total) under the tongue every 5 (five) minutes as needed for chest pain. 20 tablet 12   nystatin (MYCOSTATIN/NYSTOP) powder Apply 1 application topically 3 (three) times daily as needed. 60 g 0   omeprazole  (PRILOSEC) 40 MG capsule Take 1 capsule (40 mg total) by mouth at bedtime. 90 capsule 3   ondansetron (ZOFRAN-ODT) 4 MG disintegrating tablet DISSOLVE 1 TABLET(4 MG) ON THE TONGUE EVERY 8 HOURS AS NEEDED FOR NAUSEA OR VOMITING 30 tablet 1   Oxycodone HCl 20 MG TABS Take 1 tablet (20 mg total) by mouth 2 (two) times daily as needed. 60 tablet 0   polyethylene glycol (MIRALAX / GLYCOLAX) packet Take 17 g by mouth daily as needed for mild constipation. 14 each 0   Potassium Chloride 40 MEQ/15ML (20%) SOLN Take 7.5 mLs by mouth daily. 2250 mL 11   predniSONE (DELTASONE) 5 MG tablet Take 5 mg by mouth daily with breakfast.     promethazine (PHENERGAN) 12.5 MG tablet Take 1 tablet (12.5 mg total) by mouth every 6 (six) hours as needed for nausea or vomiting. 30 tablet 1   spironolactone (ALDACTONE) 25 MG tablet TAKE 1 TABLET(25 MG) BY MOUTH DAILY 30 tablet 11   terazosin (HYTRIN) 10 MG capsule TAKE 1 CAPSULE(10 MG) BY MOUTH AT BEDTIME 90 capsule 3   torsemide (DEMADEX) 20 MG tablet Take 1 tablet (20 mg total) by mouth daily. 1 tablet 0   ketoconazole (NIZORAL) 2 % cream APPLY A SMALL AMOUNT TO AFFECTED AREA ONCE A DAY TO RASH ON BUTTOCKS, GROIN (Patient not taking: Reported on 06/19/2022) 60 g 1   No current facility-administered medications  on file prior to visit.    Allergies  Allergen Reactions   Fluoxetine Other (See Comments)    Headache, shaking, sleep issues Headache, shaking, sleep issues   Phenergan [Promethazine] Other (See Comments)    Sleepiness   Codeine     Nausea and vomiting/only when taking too much    Past Medical History:  Diagnosis Date   Anemia    Aortic atherosclerosis    Asthma    Basal cell carcinoma    CAD (coronary artery disease)    a.) LHC 05/24/2010 - nonobstructive CAD - med mgmt; b.) LHC 06/04/2020: 30% dLAD, 40% o-mLAD - med mgmt   Chronic diastolic CHF (congestive heart failure)    Claustrophobia    COPD (chronic obstructive pulmonary disease)     Diastolic dysfunction    a. echo 07/2014: EF 55-60%, no RWMA, GR2DD, mild MR, LA moderately dilated, PASP 38 mm Hg   Dyslipidemia    Hemihypertrophy    History of bilateral cataract extraction 2018   History of gout    Hyperplastic colonic polyp 2003   Hypertension    Hypokalemia    Long term current use of amiodarone    Long term current use of immunosuppressive drug    a.) certolizumab pegol  + predisone for RA diagnosis   Long-term current use of anticonvulsant    a.) apixaban   Migraines    Morbid obesity    OSA (obstructive sleep apnea)    a.) non-complaint with prescribed nocturnal PAP therapy   PAF (paroxysmal atrial fibrillation)    a.) CHA2DS2-VASc = 5 (age, sex, CHF, HTN, vascular disease history); b.) rate and rhythm maintained on oral amiodarone + metoprolol succinate; chronically anticoagulated with standard dose apixaban   Rheumatoid arthritis    Stage 3 chronic kidney disease     Past Surgical History:  Procedure Laterality Date   ABDOMINAL HYSTERECTOMY     APPLICATION OF WOUND VAC Right 08/09/2017   Procedure: APPLICATION OF WOUND VAC;  Surgeon: Recardo Evangelist, DPM;  Location: [facility];  Service: Podiatry;  Laterality: Right;   CARDIAC CATHETERIZATION  05/24/2010   nonobstructive CAD   CATARACT EXTRACTION W/ INTRAOCULAR LENS IMPLANT Right 06/12/2016   Dr. Mia Creek   CATARACT EXTRACTION W/ INTRAOCULAR LENS IMPLANT Left 06/26/2016   Dr. Mia Creek   CESAREAN SECTION     CHOLECYSTECTOMY     COLONOSCOPY  06/12/2011   Procedure: COLONOSCOPY;  Surgeon: Charna Elizabeth, MD;  Location: [facility];  Service: Endoscopy;  Laterality: N/A;   COLONOSCOPY N/A 03/17/2013   Procedure: COLONOSCOPY;  Surgeon: Charna Elizabeth, MD;  Location: [facility];  Service: Endoscopy;  Laterality: N/A;   EYE SURGERY     FOOT ARTHRODESIS Right 07/13/2017   Procedure: ARTHRODESIS FOOT-MULTI.FUSIONS (6 JOINTS);  Surgeon: Recardo Evangelist, DPM;  Location: [facility];  Service: Podiatry;   Laterality: Right;   IRRIGATION AND DEBRIDEMENT FOOT Right 08/09/2017   Procedure: IRRIGATION AND DEBRIDEMENT FOOT;  Surgeon: Recardo Evangelist, DPM;  Location: [facility];  Service: Podiatry;  Laterality: Right;   KNEE ARTHROSCOPY     bilateral   LEFT HEART CATH AND CORONARY ANGIOGRAPHY N/A 06/04/2020   Procedure: LEFT HEART CATH AND CORONARY ANGIOGRAPHY;  Surgeon: Iran Ouch, MD;  Location: [facility];  Service: Cardiovascular;  Laterality: N/A;   REVERSE SHOULDER ARTHROPLASTY Right 11/16/2020   Procedure: REVERSE SHOULDER ARTHROPLASTY;  Surgeon: Christena Flake, MD;  Location: [facility];  Service: Orthopedics;  Laterality: Right;   SHOULDER CLOSED REDUCTION Right  11/11/2020   Procedure: CLOSED REDUCTION SHOULDER;  Surgeon: Christena Flake, MD;  Location: [facility];  Service: Orthopedics;  Laterality: Right;   SHOULDER INJECTION Right 11/11/2020   Procedure: SHOULDER INJECTION;  Surgeon: Christena Flake, MD;  Location: [facility];  Service: Orthopedics;  Laterality: Right;   SINUSOTOMY     TOOTH EXTRACTION  12/2016   VAGINAL HYSTERECTOMY      Family History  Problem Relation Age of Onset   Tuberculosis Mother    Parkinsonism Mother    Emphysema Father        smoked   Arthritis Father    Cancer Sister    Diabetes type II Sister    Breast cancer Sister    Tuberculosis Sister    Breast cancer Maternal Aunt    Colon cancer Neg Hx    Esophageal cancer Neg Hx    Pancreatic cancer Neg Hx    Stomach cancer Neg Hx     Social History   Socioeconomic History   Marital status: Married    Spouse name: Not on file   Number of children: 1   Years of education: Not on file   Highest education level: Bachelor's degree (e.g., BA, AB, BS)  Occupational History   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  Tobacco Use   Smoking status: Never    Passive exposure: Past   Smokeless tobacco: Never  Vaping Use   Vaping Use: Never used  Substance and Sexual  Activity   Alcohol use: No   Drug use: No   Sexual activity: Not Currently  Other Topics Concern   Not on file  Social History Narrative   No living will   Daughter should be decision maker   Would accept resuscitation attempts--but no prolonged ventilator or tube feeds   Social Determinants of Health   Financial Resource Strain: Low Risk  (06/19/2022)   Overall Financial Resource Strain (CARDIA)    Difficulty of Paying Living Expenses: Not hard at all  Food Insecurity: No Food Insecurity (06/19/2022)   Hunger Vital Sign    Worried About Running Out of Food in the Last Year: Never true    Ran Out of Food in the Last Year: Never true  Transportation Needs: No Transportation Needs (06/19/2022)   PRAPARE - Administrator, Civil Service (Medical): No    Lack of Transportation (Non-Medical): No  Physical Activity: Unknown (06/19/2022)   Exercise Vital Sign    Days of Exercise per Week: 0 days    Minutes of Exercise per Session: Not on file  Stress: No Stress Concern Present (06/19/2022)   [employer] of Occupational Health - Occupational Stress Questionnaire    Feeling of Stress : Only a little  Social Connections: Moderately Isolated (06/19/2022)   Social Connection and Isolation Panel [NHANES]    Frequency of Communication with Friends and Family: More than three times a week    Frequency of Social Gatherings with Friends and Family: Three times a week    Attends Religious Services: Never    Active Member of Clubs or Organizations: No    Attends Engineer, structural: Not on file    Marital Status: Married  Catering manager Violence: Not on file   Review of Systems Husband is self sufficient--he doesn't do housework Sleep is not great--usually naps in the evening, then up for a while, then back to sleep Nocturia does disrupt her sleep at times  Objective:   Physical Exam         Assessment & Plan:

## 2022-06-19 NOTE — Patient Instructions (Addendum)
Your procedure is scheduled on: Tuesday, April 30 Report to the Registration Desk on the 1st floor of the [facility] To find out your arrival time, please call [phone] between 1PM - 3PM on: Monday, April 29 If your arrival time is 6:00 am, do not arrive before that time as the [facility] entrance doors do not open until 6:00 am.  REMEMBER: Instructions that are not followed completely may result in serious medical risk, up to and including death; or upon the discretion of your surgeon and anesthesiologist your surgery may need to be rescheduled.  Do not eat food after midnight the night before surgery.  No gum chewing or hard candies.  You may however, drink CLEAR liquids up to 2 hours before you are scheduled to arrive for your surgery. Do not drink anything within 2 hours of your scheduled arrival time.  Clear liquids include: - water  - apple juice without pulp - gatorade (not RED colors) - black coffee or tea (Do NOT add milk or creamers to the coffee or tea) Do NOT drink anything that is not on this list.  In addition, your doctor has ordered for you to drink the provided:  Ensure Pre-Surgery Clear Carbohydrate Drink  Drinking this carbohydrate drink up to two hours before surgery helps to reduce insulin resistance and improve patient outcomes. Please complete drinking 2 hours before scheduled arrival time.  One week prior to surgery: starting April 23 Stop Anti-inflammatories (NSAIDS) such as Advil, Aleve, Ibuprofen, Motrin, Naproxen, Naprosyn and Aspirin based products such as Excedrin, Goody's Powder, BC Powder. Stop ANY OVER THE COUNTER supplements until after surgery. You may however, continue to take Tylenol if needed for pain up until the day of surgery.  Continue taking all prescribed medications with the exception of the following:  Eliquis hold for 3 days before surgery. Last day to TAKE is Friday, April 26. Resume AFTER surgery per surgeon  instruction.  TAKE ONLY THESE MEDICATIONS THE MORNING OF SURGERY WITH A SIP OF WATER:  Atorvastatin (Lipitor) Omeprazole (Prilosec) - (take one the night before and one on the morning of surgery - helps to prevent nausea after surgery.) Oxycodone if needed for pain  No Alcohol for 24 hours before or after surgery.  No Smoking including e-cigarettes for 24 hours before surgery.  No chewable tobacco products for at least 6 hours before surgery.  No nicotine patches on the day of surgery.  Do not use any "recreational" drugs for at least a week (preferably 2 weeks) before your surgery.  Please be advised that the combination of cocaine and anesthesia may have negative outcomes, up to and including death. If you test positive for cocaine, your surgery will be cancelled.  On the morning of surgery brush your teeth with toothpaste and water, you may rinse your mouth with mouthwash if you wish. Do not swallow any toothpaste or mouthwash.  Use CHG Soap as directed on instruction sheet.  Do not wear jewelry, make-up, hairpins, clips or nail polish.  Do not wear lotions, powders, or perfumes. No deodorant.   Do not shave body hair from the neck down 48 hours before surgery.  Contact lenses, hearing aids and dentures may not be worn into surgery.  Do not bring valuables to the hospital. [facility] is not responsible for any missing/lost belongings or valuables.   Total Shoulder Arthroplasty:  use Benzoyl Peroxide 5% Gel as directed on instruction sheet.  Notify your doctor if there is any change in your  medical condition (cold, fever, infection).  Wear comfortable clothing (specific to your surgery type) to the hospital.  After surgery, you can help prevent lung complications by doing breathing exercises.  Take deep breaths and cough every 1-2 hours. Your doctor may order a device called an Incentive Spirometer to help you take deep breaths.  If you are being admitted to the hospital  overnight, leave your suitcase in the car. After surgery it may be brought to your room.  In case of increased patient census, it may be necessary for you, the patient, to continue your postoperative care in the Same Day Surgery department.  If you are being discharged the day of surgery, you will not be allowed to drive home. You will need a responsible individual to drive you home and stay with you for 24 hours after surgery.   If you are taking public transportation, you will need to have a responsible individual with you.  Please call the Pre-admissions Testing Dept. at [phone] if you have any questions about these instructions.  Surgery Visitation Policy:  Patients having surgery or a procedure may have two visitors.  Children under the age of 65 must have an adult with them who is not the patient.  Inpatient Visitation:    Visiting hours are 7 a.m. to 8 p.m. Up to four visitors are allowed at one time in a patient room. The visitors may rotate out with other people during the day.  One visitor age 25 or older may stay with the patient overnight and must be in the room by 8 p.m.  Preoperative Educational Videos for Total Hip, Knee and Shoulder Replacements  To better prepare for surgery, please view our videos that explain the physical activity and discharge planning required to have the best surgical recovery at [facility].  [email or web address]  Questions? Call [phone] or email [email or web address]

## 2022-06-19 NOTE — Telephone Encounter (Addendum)
Surgery date is listed as 06/17/22. I reviewed surgery date scheduled in epic shows 06/27/22. I will confirm this with requesting provider.    Request for pre-operative cardiac clearance Received: Chong Sicilian, Tina Huger, NP  P Cv Div Preop Callback Request for pre-operative cardiac clearance:   1. What type of surgery is being performed? REVERSE SHOULDER ARTHROPLASTY WITH BICEPS TENODESIS  2. When is this surgery scheduled? 06/27/2022   3. Type of clearance being requested (medical, pharmacy, both)? BOTH   4. Are there any medications that need to be held prior to surgery? APIXABAN  5. Practice name and name of physician performing surgery? Performing surgeon: Dr. Leron Croak, MD Requesting clearance: Quentin Mulling, FNP-C     6. Anesthesia type (none, local, MAC, general)? GENERAL  7. What is the office phone and fax number?   Phone: [phone] Fax: [phone]  ATTENTION: Unable to create telephone message as per your standard workflow. Directed by [facility] providers to send requests for cardiac clearance to this pool for appropriate distribution to provider covering pre-operative clearances.  Quentin Mulling, MSN, APRN, FNP-C, CEN [facility] Peri-operative Services Nurse Practitioner Phone: [phone] 06/18/22 9:12 AM

## 2022-06-19 NOTE — Telephone Encounter (Signed)
I s/w the pt's husband and left verbal message for the pt to call back to schedule a tele preop appt.

## 2022-06-19 NOTE — Telephone Encounter (Signed)
Patient with diagnosis of PAF on Eliquis for anticoagulation.    Procedure: REVERSE SHOULDER ARTHROPLASTY WITH BICEPS TENODESIS  Date of procedure: 06/27/2022   CHA2DS2-VASc Score = 5   This indicates a 7.2% annual risk of stroke. The patient's score is based upon: CHF History: 1 HTN History: 1 Diabetes History: 0 Stroke History: 0 Vascular Disease History: 1 Age Score: 1 Gender Score: 1     CrCl 45 mL/min Platelet count 194K    Per office protocol, patient can hold Eliquis for 3 days prior to procedure.   **This guidance is not considered finalized until pre-operative APP has relayed final recommendations.**

## 2022-06-20 DIAGNOSIS — S46012A Strain of muscle(s) and tendon(s) of the rotator cuff of left shoulder, initial encounter: Secondary | ICD-10-CM | POA: Diagnosis not present

## 2022-06-20 DIAGNOSIS — Z6841 Body Mass Index (BMI) 40.0 and over, adult: Secondary | ICD-10-CM | POA: Diagnosis not present

## 2022-06-20 NOTE — Telephone Encounter (Signed)
Tried to call the busy to set up tele appt though line is busy. I will send FYI to requesting office that pt needs a tele visit for pre op.

## 2022-06-21 ENCOUNTER — Encounter: Payer: Self-pay | Admitting: Cardiovascular Disease

## 2022-06-21 ENCOUNTER — Telehealth: Payer: Self-pay | Admitting: *Deleted

## 2022-06-21 ENCOUNTER — Ambulatory Visit: Payer: Medicare Other | Attending: Nurse Practitioner | Admitting: Nurse Practitioner

## 2022-06-21 DIAGNOSIS — Z01810 Encounter for preprocedural cardiovascular examination: Secondary | ICD-10-CM

## 2022-06-21 NOTE — Telephone Encounter (Signed)
Pt and her daughter called back and pt has been scheduled for tele pre op appt today. Pt would wanted to do her tele appt today at 1:40. Med rec and consent are done.

## 2022-06-21 NOTE — Telephone Encounter (Signed)
Pt and her daughter called back and pt has been scheduled for tele pre op appt today. Pt would wanted to do her tele appt today at 1:40. Med rec and consent are done.     Patient Consent for Virtual Visit        Tina Pacheco has provided verbal consent on 06/21/2022 for a virtual visit (video or telephone).   CONSENT FOR VIRTUAL VISIT FOR:  Tina Pacheco  By participating in this virtual visit I agree to the following:  I hereby voluntarily request, consent and authorize [facility] and its employed or contracted physicians, physician assistants, nurse practitioners or other licensed health care professionals (the Practitioner), to provide me with telemedicine health care services (the "Services") as deemed necessary by the treating Practitioner. I acknowledge and consent to receive the Services by the Practitioner via telemedicine. I understand that the telemedicine visit will involve communicating with the Practitioner through live audiovisual communication technology and the disclosure of certain medical information by electronic transmission. I acknowledge that I have been given the opportunity to request an in-person assessment or other available alternative prior to the telemedicine visit and am voluntarily participating in the telemedicine visit.  I understand that I have the right to withhold or withdraw my consent to the use of telemedicine in the course of my care at any time, without affecting my right to future care or treatment, and that the Practitioner or I may terminate the telemedicine visit at any time. I understand that I have the right to inspect all information obtained and/or recorded in the course of the telemedicine visit and may receive copies of available information for a reasonable fee.  I understand that some of the potential risks of receiving the Services via telemedicine include:  Delay or interruption in medical evaluation due to technological equipment  failure or disruption; Information transmitted may not be sufficient (e.g. poor resolution of images) to allow for appropriate medical decision making by the Practitioner; and/or  In rare instances, security protocols could fail, causing a breach of personal health information.  Furthermore, I acknowledge that it is my responsibility to provide information about my medical history, conditions and care that is complete and accurate to the best of my ability. I acknowledge that Practitioner's advice, recommendations, and/or decision may be based on factors not within their control, such as incomplete or inaccurate data provided by me or distortions of diagnostic images or specimens that may result from electronic transmissions. I understand that the practice of medicine is not an exact science and that Practitioner makes no warranties or guarantees regarding treatment outcomes. I acknowledge that a copy of this consent can be made available to me via my patient portal [facility] MyChart), or I can request a printed copy by calling the office of [facility].    I understand that my insurance will be billed for this visit.   I have read or had this consent read to me. I understand the contents of this consent, which adequately explains the benefits and risks of the Services being provided via telemedicine.  I have been provided ample opportunity to ask questions regarding this consent and the Services and have had my questions answered to my satisfaction. I give my informed consent for the services to be provided through the use of telemedicine in my medical care

## 2022-06-21 NOTE — Progress Notes (Signed)
Virtual Visit via Telephone Note   Because of [place] co-morbid illnesses, she is at least at moderate risk for complications without adequate follow up.  This format is felt to be most appropriate for this patient at this time.  The patient did not have access to video technology/had technical difficulties with video requiring transitioning to audio format only (telephone).  All issues noted in this document were discussed and addressed.  No physical exam could be performed with this format.  Please refer to the patient's chart for her consent to telehealth for [facility]  Evaluation Performed:  Preoperative cardiovascular risk assessment _____________   Date:  06/21/2022   Patient ID:  Tina Pacheco, Tina Pacheco 31-Jul-1950, MRN [id number] Patient Location:  Home Provider location:   Office  Primary Care Provider:  Karie Schwalbe, MD Primary Cardiologist:  Julien Nordmann, MD  Chief Complaint / Patient Profile   72 y.o. y/o female with a h/o nonobstructive CAD, chronic diastolic heart failure, paroxysmal atrial fibrillation, hypertension, hyperlipidemia, CKD stage III, COPD, anemia, rheumatoid arthritis, fibromyalgia, falls, morbid obesity, H. pylori, and OSA who is pending reverse shoulder arthroplasty with biceps tenodesis on 06/27/2022 with Dr. Leron Croak and presents today for telephonic preoperative cardiovascular risk assessment.  History of Present Illness    Tina Pacheco is a 72 y.o. female who presents via audio/video conferencing for a telehealth visit today.  Pt was last seen in cardiology clinic on 02/28/2022 by Eula Listen, PA.  At that time Tina Pacheco was doing well.  The patient is now pending procedure as outlined above. Since her last visit, she has done well from a cardiac standpoint.  Her activity is somewhat limited in the setting of gait instability, musculoskeletal pain.  She denies chest pain, palpitations, dyspnea, pnd, orthopnea, n, v,  dizziness, syncope, edema, weight gain, or early satiety. All other systems reviewed and are otherwise negative except as noted above.   Past Medical History    Past Medical History:  Diagnosis Date   Anemia    Aortic atherosclerosis    Asthma    Basal cell carcinoma    CAD (coronary artery disease)    a.) LHC 05/24/2010 - nonobstructive CAD - med mgmt; b.) LHC 06/04/2020: 30% dLAD, 40% o-mLAD - med mgmt   Chronic diastolic CHF (congestive heart failure)    Claustrophobia    COPD (chronic obstructive pulmonary disease)    DDD (degenerative disc disease), lumbosacral    Diastolic dysfunction    a. echo 07/2014: EF 55-60%, no RWMA, GR2DD, mild MR, LA moderately dilated, PASP 38 mm Hg   Dyslipidemia    GERD (gastroesophageal reflux disease)    Gouty arthropathy    Hemihypertrophy    History of bilateral cataract extraction 2018   History of gout    Hyperplastic colonic polyp 2003   Hypertension    Hypokalemia    Long term (current) use of anticoagulants    Long term current use of amiodarone    Long term current use of immunosuppressive drug    a.) certolizumab pegol  + predisone for RA diagnosis   Long-term current use of anticonvulsant    a.) apixaban   Migraines    Morbid obesity    OSA (obstructive sleep apnea)    a.) non-complaint with prescribed nocturnal PAP therapy   Osteoarthritis    PAF (paroxysmal atrial fibrillation)    a.) CHA2DS2-VASc = 5 (age, sex, CHF, HTN, vascular disease history); b.) rate and rhythm  maintained on oral amiodarone + metoprolol succinate; chronically anticoagulated with standard dose apixaban   Pneumonia    Rheumatoid arthritis    Stage 3 chronic kidney disease    Past Surgical History:  Procedure Laterality Date   ABDOMINAL HYSTERECTOMY     APPLICATION OF WOUND VAC Right 08/09/2017   Procedure: APPLICATION OF WOUND VAC;  Surgeon: Recardo Evangelist, DPM;  Location: [facility];  Service: Podiatry;  Laterality: Right;   CARDIAC CATHETERIZATION   05/24/2010   nonobstructive CAD   CATARACT EXTRACTION W/ INTRAOCULAR LENS IMPLANT Right 06/12/2016   Dr. Mia Creek   CATARACT EXTRACTION W/ INTRAOCULAR LENS IMPLANT Left 06/26/2016   Dr. Mia Creek   CESAREAN SECTION     CHOLECYSTECTOMY     COLONOSCOPY  06/12/2011   Procedure: COLONOSCOPY;  Surgeon: Charna Elizabeth, MD;  Location: [facility];  Service: Endoscopy;  Laterality: N/A;   COLONOSCOPY N/A 03/17/2013   Procedure: COLONOSCOPY;  Surgeon: Charna Elizabeth, MD;  Location: [facility];  Service: Endoscopy;  Laterality: N/A;   FOOT ARTHRODESIS Right 07/13/2017   Procedure: ARTHRODESIS FOOT-MULTI.FUSIONS (6 JOINTS);  Surgeon: Recardo Evangelist, DPM;  Location: [facility];  Service: Podiatry;  Laterality: Right;   IRRIGATION AND DEBRIDEMENT FOOT Right 08/09/2017   Procedure: IRRIGATION AND DEBRIDEMENT FOOT;  Surgeon: Recardo Evangelist, DPM;  Location: [facility];  Service: Podiatry;  Laterality: Right;   KNEE ARTHROSCOPY Bilateral    bilateral   LEFT HEART CATH AND CORONARY ANGIOGRAPHY N/A 06/04/2020   Procedure: LEFT HEART CATH AND CORONARY ANGIOGRAPHY;  Surgeon: Iran Ouch, MD;  Location: [facility];  Service: Cardiovascular;  Laterality: N/A;   REVERSE SHOULDER ARTHROPLASTY Right 11/16/2020   Procedure: REVERSE SHOULDER ARTHROPLASTY;  Surgeon: Christena Flake, MD;  Location: [facility];  Service: Orthopedics;  Laterality: Right;   SHOULDER CLOSED REDUCTION Right 11/11/2020   Procedure: CLOSED REDUCTION SHOULDER;  Surgeon: Christena Flake, MD;  Location: [facility];  Service: Orthopedics;  Laterality: Right;   SHOULDER INJECTION Right 11/11/2020   Procedure: SHOULDER INJECTION;  Surgeon: Christena Flake, MD;  Location: [facility];  Service: Orthopedics;  Laterality: Right;   SINUSOTOMY     TOOTH EXTRACTION  12/2016    Allergies  Allergies  Allergen Reactions   Fluoxetine Other (See Comments)    Headache, shaking, sleep issues Headache, shaking, sleep issues   Phenergan  [Promethazine] Other (See Comments)    Sleepiness   Codeine     Nausea and vomiting/only when taking too much    Home Medications    Prior to Admission medications   Medication Sig Start Date End Date Taking? Authorizing Provider  amiodarone (PACERONE) 200 MG tablet TAKE 1 TABLET(200 MG) BY MOUTH DAILY Patient taking differently: Take 200 mg by mouth at bedtime. TAKE 1 TABLET(200 MG) BY MOUTH DAILY 06/05/22   Antonieta Iba, MD  amoxicillin (AMOXIL) 500 MG capsule Take 2,000 mg by mouth once. Prior to dental procedures    [provider]  apixaban (ELIQUIS) 5 MG TABS tablet Take 1 tablet (5 mg total) by mouth 2 (two) times daily. 03/08/22   Dunn, Raymon Mutton, PA-C  atorvastatin (LIPITOR) 40 MG tablet TAKE 1 TABLET(40 MG) BY MOUTH DAILY 06/05/22   Karie Schwalbe, MD  Certolizumab Pegol [facility]) Inject into the skin. Patient states taking 1 x per month    [provider]  docusate sodium (COLACE) 100 MG capsule Take 1 capsule (100 mg total) by mouth 2 (two) times daily. 09/04/21 09/04/22  Allena Katz,  Zachery Conch, MD  metolazone (ZAROXOLYN) 2.5 MG tablet Take 1 tablet (2.5 mg total) by mouth daily as needed. If home weight is over 280# 09/09/21   Tillman Abide I, MD  metoprolol succinate (TOPROL-XL) 50 MG 24 hr tablet TAKE 1 TABLET BY MOUTH EVERY DAY WITH OR IMMEDIATELY FOLLOWING A MEAL Patient taking differently: Take 50 mg by mouth at bedtime. TAKE 1 TABLET BY MOUTH EVERY DAY WITH OR IMMEDIATELY FOLLOWING A MEAL 04/12/22   Tillman Abide I, MD  montelukast (SINGULAIR) 10 MG tablet TAKE 1 TABLET BY MOUTH AT BEDTIME 12/12/21   Karie Schwalbe, MD  nitroGLYCERIN (NITROSTAT) 0.4 MG SL tablet Place 1 tablet (0.4 mg total) under the tongue every 5 (five) minutes as needed for chest pain. 06/05/20   Enedina Finner, MD  nystatin (MYCOSTATIN/NYSTOP) powder Apply 1 application topically 3 (three) times daily as needed. 12/18/20   Medina-Vargas, Monina C, NP  omeprazole (PRILOSEC) 40 MG capsule Take 1  capsule (40 mg total) by mouth at bedtime. 04/14/22   Karie Schwalbe, MD  ondansetron (ZOFRAN-ODT) 4 MG disintegrating tablet DISSOLVE 1 TABLET(4 MG) ON THE TONGUE EVERY 8 HOURS AS NEEDED FOR NAUSEA OR VOMITING 01/30/22   Karie Schwalbe, MD  Oxycodone HCl 20 MG TABS Take 1 tablet (20 mg total) by mouth 2 (two) times daily as needed. 06/19/22   Karie Schwalbe, MD  polyethylene glycol (MIRALAX / Ethelene Hal) packet Take 17 g by mouth daily as needed for mild constipation. 07/16/17   Houston Siren, MD  Potassium Chloride 40 MEQ/15ML (20%) SOLN Take 7.5 mLs by mouth daily. 12/30/21   Karie Schwalbe, MD  predniSONE (DELTASONE) 5 MG tablet Take 1 tablet (5 mg total) by mouth daily with breakfast. Patient taking differently: Take 5 mg by mouth at bedtime. 06/19/22   Karie Schwalbe, MD  promethazine (PHENERGAN) 12.5 MG tablet Take 1 tablet (12.5 mg total) by mouth every 6 (six) hours as needed for nausea or vomiting. 05/17/22   Zehr, Princella Pellegrini, PA-C  spironolactone (ALDACTONE) 25 MG tablet TAKE 1 TABLET(25 MG) BY MOUTH DAILY 06/06/21   Tillman Abide I, MD  terazosin (HYTRIN) 10 MG capsule TAKE 1 CAPSULE(10 MG) BY MOUTH AT BEDTIME 04/12/22   Tillman Abide I, MD  torsemide (DEMADEX) 20 MG tablet Take 1 tablet (20 mg total) by mouth daily. Patient taking differently: Take 40 mg by mouth daily. 02/28/22   Sondra Barges, PA-C    Physical Exam    Vital Signs:  Humberto Leep does not have vital signs available for review today.  Given telephonic nature of communication, physical exam is limited. AAOx3. NAD. Normal affect.  Speech and respirations are unlabored.  Accessory Clinical Findings    None  Assessment & Plan    1.  Preoperative Cardiovascular Risk Assessment:  According to the Revised Cardiac Risk Index (RCRI), her Perioperative Risk of Major Cardiac Event is (%): 0.9. Her Functional Capacity in METs is: 4.36 according to the [facility] Activity Status Index (DASI). Therefore, based on  ACC/AHA guidelines, patient would be at acceptable risk for the planned procedure without further cardiovascular testing.  The patient was advised that if she develops new symptoms prior to surgery to contact our office to arrange for a follow-up visit, and she verbalized understanding.  Per office protocol, patient can hold Eliquis for 3 days prior to procedure. Please resume Eliquis as soon as possible postprocedure, at the discretion of the surgeon.    A copy of  this note will be routed to requesting surgeon.  Time:   Today, I have spent 5 minutes with the patient with telehealth technology discussing medical history, symptoms, and management plan.     Joylene Grapes, NP  06/21/2022, 1:47 PM

## 2022-06-21 NOTE — Telephone Encounter (Signed)
Appt has been made per note from the preop clearance team.

## 2022-06-21 NOTE — Telephone Encounter (Signed)
Patient's daughter is returning call. Transferred to [place], CMA.

## 2022-06-25 ENCOUNTER — Encounter: Payer: Self-pay | Admitting: Surgery

## 2022-06-25 NOTE — Progress Notes (Signed)
Perioperative / Anesthesia Services  Pre-Admission Testing Clinical Review / Preoperative Anesthesia Consult  Date: 06/25/22  Patient Demographics:  Name: Tina Pacheco DOB:   04-28-1950 MRN:   [id number]  Planned Surgical Procedure(s):    Case: [id number] Date/Time: 06/27/22 0715   Procedure: REVERSE SHOULDER ARTHROPLASTY WITH BICEPS TENODESIS. (Left: Shoulder)   Anesthesia type: Choice   Pre-op diagnosis:      Nontraumatic complete tear of left rotator cuff M75.122     Injury of tendon of long head of left biceps, initial encounter S46.102A     Rotator cuff arthropathy, left M12.812   Location: [facility] OR ROOM 03 / [facility]   Surgeons: Christena Flake, MD     NOTE: Available PAT nursing documentation and vital signs have been reviewed. Clinical nursing staff has updated patient's PMH/PSHx, current medication list, and drug allergies/intolerances to ensure comprehensive history available to assist in medical decision making as it pertains to the aforementioned surgical procedure and anticipated anesthetic course. Extensive review of available clinical information personally performed. [place] PMH and PSHx updated with any diagnoses/procedures that  may have been inadvertently omitted during her intake with the pre-admission testing department's nursing staff.  Clinical Discussion:  Tina Pacheco is a 72 y.o. female who is submitted for pre-surgical anesthesia review and clearance prior to her undergoing the above procedure. Patient has never been a smoker. Pertinent PMH includes: CAD, HFpEF, PAF, aortic atherosclerosis, HTN, HLD, prediabetes, CKD-III, asthma, COPD, OSAH (noncompliant with nocturnal PAP therapy), GERD (on daily PPI), anemia, OA, RA, fibromyalgia, frequent falls, lumbosacral DDD, depression, chronic opioid therapy.  Patient is followed by cardiology Mariah Milling, MD). She was last seen in the cardiology clinic on 02/28/2022; notes reviewed. At the time of  her clinic visit, patient doing "reasonably well" overall from a cardiovascular perspective. Breathing stable. Peripheral edema significantly improved. Weight down 50 pounds since previous visit in 06/2021. Patient denied any chest pain, shortness of breath, PND, orthopnea, palpitations, significant peripheral edema, weakness, fatigue, vertiginous symptoms, or presyncope/syncope. Patient with a past medical history significant for cardiovascular diagnoses. Documented physical exam was grossly benign, providing no evidence of acute exacerbation and/or decompensation of the patient's known cardiovascular conditions.  Patient underwent a diagnostic LEFT heart catheterization on 05/24/2010 that revealed no evidence of obstructive coronary artery disease. No intervention was required.  Repeat diagnostic LEFT heart catheterization was performed on 06/04/2020 revealing mild to moderate LAD disease; 40% o-mLAD and 30% dLAD. Again, given the non-obstructive nature of her disease, intervention was deferred opting for medical management.   Patient with a HFpEF diagnosis. Cardiac function serially monitored via echocardiogram. Most recent TTE was performed on 05/29/2020 revealing normal left ventricular systolic function with an EF of 60-65%. There were no regional wall motion abnormalities. There was mild LVH. Left atrium was moderately enlarged. No significant valvular regurgitation observed. All transvalvular gradients were noted to be normal providing no evidence suggestive of valvular stenosis.   Patient with an atrial fibrillation diagnosis; CHA2DS2-VASc Score = 5 (age, sex, HFpEF, HTN, vascular disease history). Her rate and rhythm are currently being maintained on oral amiodarone + metoprolol succinate. She is chronically anticoagulated using apixaban; reported to be compliant with therapy with no evidence or reports of GI bleeding.  Blood pressure well controlled at 122/72 mmHg on currently prescribed CCB  (amiodarone), diuretic (metolazone + spironolactone + torsemide), beta blocker (metoprolol succinate), and alpha blocker (terazosin) therapies. She is on a atorvastatin for her HLD diagnosis and further ASCVD prevention.  Patient has a prediabetes diagnosis; last Hgb A1c was 5.7% when checked on 09/01/2021.  She does have an OSAH diagnosis, however is reportedly noncompliant with prescribed nocturnal PAP therapy. Functional capacity, as defined by DASI, is documented as being >/= 4 METS. No changes were made to her medication regimen during her visit with cardiology.  Patient scheduled to follow-up with outpatient cardiology in 6 months or sooner if needed.  Tina Pacheco is scheduled for an elective REVERSE LEFT SHOULDER ARTHROPLASTY WITH BICEPS TENODESIS on 06/27/2022 with Dr. Leron Croak, MD.  Given patient's past medical history significant for cardiovascular diagnoses, presurgical cardiac clearance was sought by the PAT team.  Per cardiology, "according to the Revised Cardiac Risk Index (RCRI), her Perioperative Risk of Major Cardiac Event is (%): 0.9. Her Functional Capacity in METs is: 4.36 according to the [employer] Activity Status Index (DASI). Therefore, based on ACC/AHA guidelines, patient would be at acceptable risk for the planned procedure without further cardiovascular testing".  Again, this patient is on daily oral anticoagulation therapy.  She has been instructed on recommendations from her cardiologist for holding her apixaban dose for 3 days prior to her procedure with plans to restart since postoperatively respectively minimized by her primary attending surgeon.  The patient is aware that her last dose of apixaban should be on 06/23/2022.  Patient denies previous perioperative complications with anesthesia in the past. In review of the available records, it is noted that patient underwent a general anesthetic course here at [facility] ([facility]) in 10/2020 without  documented complications.      06/19/2022    1:58 PM 06/19/2022    9:43 AM 05/17/2022    1:57 PM  Vitals with BMI  Height 5\' 3"  5' 3.5" 5' 3.5"  Weight 254 lbs 254 lbs 234 lbs  BMI 45.01 44.28 40.8  Systolic 129 122 161  Diastolic 64 70 70  Pulse 55 54 66    Providers/Specialists:   NOTE: Primary physician provider listed below. Patient may have been seen by APP or partner within same practice.   PROVIDER ROLE / SPECIALTY LAST OV  Poggi, Excell Seltzer, MD Orthopedics (Surgeon) 06/20/2022  Karie Schwalbe, MD Primary Care Provider 06/19/2022  Julien Nordmann, MD Cardiology 02/28/2022  [name], MD Rheumatology 06/08/2022   Allergies:  Fluoxetine, Phenergan [promethazine], and Codeine  Current Home Medications:   No current facility-administered medications for this encounter.    amiodarone (PACERONE) 200 MG tablet   amoxicillin (AMOXIL) 500 MG capsule   apixaban (ELIQUIS) 5 MG TABS tablet   atorvastatin (LIPITOR) 40 MG tablet   Certolizumab Pegol (CIMZIA [place])   docusate sodium (COLACE) 100 MG capsule   metolazone (ZAROXOLYN) 2.5 MG tablet   metoprolol succinate (TOPROL-XL) 50 MG 24 hr tablet   montelukast (SINGULAIR) 10 MG tablet   nitroGLYCERIN (NITROSTAT) 0.4 MG SL tablet   nystatin (MYCOSTATIN/NYSTOP) powder   omeprazole (PRILOSEC) 40 MG capsule   ondansetron (ZOFRAN-ODT) 4 MG disintegrating tablet   Oxycodone HCl 20 MG TABS   polyethylene glycol (MIRALAX / GLYCOLAX) packet   Potassium Chloride 40 MEQ/15ML (20%) SOLN   predniSONE (DELTASONE) 5 MG tablet   promethazine (PHENERGAN) 12.5 MG tablet   spironolactone (ALDACTONE) 25 MG tablet   terazosin (HYTRIN) 10 MG capsule   torsemide (DEMADEX) 20 MG tablet   History:   Past Medical History:  Diagnosis Date   (HFpEF) heart failure with preserved ejection fraction (HCC)    a.) TTE 08/10/2014: EF 55-60%,  mild MAC, mild LAE, mild MR, PASP 38; b.) TTE 03/31/2019: EF 55-60%, mild LVH, mild-mod LAW, mod MAC, mild-mod  AoV sclerosis, triv PR, G1DD; c.) TTE 05/29/2020: EF 60-65%, mild LVH, mod LAE, G1DD   Anemia    Aortic atherosclerosis (HCC)    Asthma    Basal cell carcinoma    CAD (coronary artery disease)    a.) LHC 05/24/2010 - nonobstructive CAD - med mgmt; b.) LHC 06/04/2020: 30% dLAD, 40% o-mLAD - med mgmt   Chronic, continuous use of opioids    Claustrophobia    COPD (chronic obstructive pulmonary disease) (HCC)    DDD (degenerative disc disease), lumbosacral    Diuretic-induced hypokalemia    Dyslipidemia    Fibromyalgia    Frequent falls    GERD (gastroesophageal reflux disease)    Gouty arthropathy    Hemihypertrophy    History of bilateral cataract extraction 2018   Hyperplastic colonic polyp 2003   Hypertension    Long term current use of amiodarone    Long term current use of anticoagulant    a.) apixaban   Long term current use of immunosuppressive drug    a.) certolizumab pegol  + predisone for RA diagnosis   MDD (major depressive disorder)    Migraines    Morbid obesity (HCC)    Nontraumatic complete tear of rotator cuff, left    OSA (obstructive sleep apnea)    a.) non-complaint with prescribed nocturnal PAP therapy   Osteoarthritis    PAF (paroxysmal atrial fibrillation) (HCC)    a.) CHA2DS2-VASc = 5 (age, sex, CHF, HTN, vascular disease history); b.) rate and rhythm maintained on oral amiodarone + metoprolol succinate; chronically anticoagulated with standard dose apixaban   Pneumonia    Prediabetes    Rheumatoid arthritis (HCC)    a.) on certolizumab pegol + predisone   Stage 3 chronic kidney disease (HCC)    Past Surgical History:  Procedure Laterality Date   ABDOMINAL HYSTERECTOMY     APPLICATION OF WOUND VAC Right 08/09/2017   Procedure: APPLICATION OF WOUND VAC;  Surgeon: Recardo Evangelist, DPM;  Location: [facility];  Service: Podiatry;  Laterality: Right;   CARDIAC CATHETERIZATION  05/24/2010   nonobstructive CAD   CATARACT EXTRACTION W/ INTRAOCULAR LENS  IMPLANT Right 06/12/2016   Dr. Mia Creek   CATARACT EXTRACTION W/ INTRAOCULAR LENS IMPLANT Left 06/26/2016   Dr. Mia Creek   CESAREAN SECTION     CHOLECYSTECTOMY     COLONOSCOPY  06/12/2011   Procedure: COLONOSCOPY;  Surgeon: Charna Elizabeth, MD;  Location: [facility];  Service: Endoscopy;  Laterality: N/A;   COLONOSCOPY N/A 03/17/2013   Procedure: COLONOSCOPY;  Surgeon: Charna Elizabeth, MD;  Location: [facility];  Service: Endoscopy;  Laterality: N/A;   FOOT ARTHRODESIS Right 07/13/2017   Procedure: ARTHRODESIS FOOT-MULTI.FUSIONS (6 JOINTS);  Surgeon: Recardo Evangelist, DPM;  Location: [facility];  Service: Podiatry;  Laterality: Right;   IRRIGATION AND DEBRIDEMENT FOOT Right 08/09/2017   Procedure: IRRIGATION AND DEBRIDEMENT FOOT;  Surgeon: Recardo Evangelist, DPM;  Location: [facility];  Service: Podiatry;  Laterality: Right;   KNEE ARTHROSCOPY Bilateral    bilateral   LEFT HEART CATH AND CORONARY ANGIOGRAPHY N/A 06/04/2020   Procedure: LEFT HEART CATH AND CORONARY ANGIOGRAPHY;  Surgeon: Iran Ouch, MD;  Location: [facility];  Service: Cardiovascular;  Laterality: N/A;   REVERSE SHOULDER ARTHROPLASTY Right 11/16/2020   Procedure: REVERSE SHOULDER ARTHROPLASTY;  Surgeon: Christena Flake, MD;  Location: [facility];  Service: Orthopedics;  Laterality: Right;   SHOULDER CLOSED REDUCTION Right 11/11/2020   Procedure: CLOSED REDUCTION SHOULDER;  Surgeon: Christena Flake, MD;  Location: [facility];  Service: Orthopedics;  Laterality: Right;   SHOULDER INJECTION Right 11/11/2020   Procedure: SHOULDER INJECTION;  Surgeon: Christena Flake, MD;  Location: [facility];  Service: Orthopedics;  Laterality: Right;   SINUSOTOMY     TOOTH EXTRACTION  12/2016   Family History  Problem Relation Age of Onset   Tuberculosis Mother    Parkinsonism Mother    Emphysema Father        smoked   Arthritis Father    Cancer Sister    Diabetes type II Sister    Breast cancer Sister    Tuberculosis Sister     Breast cancer Maternal Aunt    Colon cancer Neg Hx    Esophageal cancer Neg Hx    Pancreatic cancer Neg Hx    Stomach cancer Neg Hx    Social History   Tobacco Use   Smoking status: Never    Passive exposure: Past   Smokeless tobacco: Never  Vaping Use   Vaping Use: Never used  Substance Use Topics   Alcohol use: No   Drug use: No    Pertinent Clinical Results:  LABS:   No visits with results within 3 Day(s) from this visit.  Latest known visit with results is:  Hospital Outpatient Visit on 06/19/2022  Component Date Value Ref Range Status   MRSA, PCR 06/19/2022 NEGATIVE  NEGATIVE Final   Staphylococcus aureus 06/19/2022 POSITIVE (A)  NEGATIVE Final   Comment: (NOTE) The Xpert SA Assay (FDA approved for NASAL specimens in patients 75 years of age and older), is one component of a comprehensive surveillance program. It is not intended to diagnose infection nor to guide or monitor treatment. Performed at [facility], [address] [place], [place] [address]    WBC 06/19/2022 6.3  4.0 - 10.5 K/uL Final   RBC 06/19/2022 3.92  3.87 - 5.11 MIL/uL Final   Hemoglobin 06/19/2022 11.4 (L)  12.0 - 15.0 g/dL Final   HCT 60/45/4098 36.3  36.0 - 46.0 % Final   MCV 06/19/2022 92.6  80.0 - 100.0 fL Final   MCH 06/19/2022 29.1  26.0 - 34.0 pg Final   MCHC 06/19/2022 31.4  30.0 - 36.0 g/dL Final   RDW 11/91/4782 14.4  11.5 - 15.5 % Final   Platelets 06/19/2022 204  150 - 400 K/uL Final   nRBC 06/19/2022 0.0  0.0 - 0.2 % Final   Neutrophils Relative % 06/19/2022 62  % Final   Neutro Abs 06/19/2022 3.9  1.7 - 7.7 K/uL Final   Lymphocytes Relative 06/19/2022 25  % Final   Lymphs Abs 06/19/2022 1.6  0.7 - 4.0 K/uL Final   Monocytes Relative 06/19/2022 9  % Final   Monocytes Absolute 06/19/2022 0.6  0.1 - 1.0 K/uL Final   Eosinophils Relative 06/19/2022 3  % Final   Eosinophils Absolute 06/19/2022 0.2  0.0 - 0.5 K/uL Final   Basophils Relative 06/19/2022 1  % Final    Basophils Absolute 06/19/2022 0.0  0.0 - 0.1 K/uL Final   Immature Granulocytes 06/19/2022 0  % Final   Abs Immature Granulocytes 06/19/2022 0.02  0.00 - 0.07 K/uL Final   Performed at [facility], [address] [place], [place] [address]   Sodium 06/19/2022 139  135 - 145 mmol/L Final   Potassium 06/19/2022 3.5  3.5 - 5.1  mmol/L Final   Chloride 06/19/2022 101  98 - 111 mmol/L Final   CO2 06/19/2022 27  22 - 32 mmol/L Final   Glucose, Bld 06/19/2022 78  70 - 99 mg/dL Final   Glucose reference range applies only to samples taken after fasting for at least 8 hours.   BUN 06/19/2022 27 (H)  8 - 23 mg/dL Final   Creatinine, Ser 06/19/2022 1.29 (H)  0.44 - 1.00 mg/dL Final   Calcium 29/56/2130 9.0  8.9 - 10.3 mg/dL Final   Total Protein 86/57/8469 6.7  6.5 - 8.1 g/dL Final   Albumin 62/95/2841 3.4 (L)  3.5 - 5.0 g/dL Final   AST 32/44/0102 20  15 - 41 U/L Final   ALT 06/19/2022 10  0 - 44 U/L Final   Alkaline Phosphatase 06/19/2022 112  38 - 126 U/L Final   Total Bilirubin 06/19/2022 0.9  0.3 - 1.2 mg/dL Final   GFR, Estimated 06/19/2022 44 (L)  >60 mL/min Final   Comment: (NOTE) Calculated using the CKD-EPI Creatinine Equation (2021)    Anion gap 06/19/2022 11  5 - 15 Final   Performed at [facility], [address] [place], [place] [address]   Color, Urine 06/19/2022 YELLOW (A)  YELLOW Final   APPearance 06/19/2022 CLEAR (A)  CLEAR Final   Specific Gravity, Urine 06/19/2022 1.013  1.005 - 1.030 Final   pH 06/19/2022 7.0  5.0 - 8.0 Final   Glucose, UA 06/19/2022 NEGATIVE  NEGATIVE mg/dL Final   Hgb urine dipstick 06/19/2022 NEGATIVE  NEGATIVE Final   Bilirubin Urine 06/19/2022 NEGATIVE  NEGATIVE Final   Ketones, ur 06/19/2022 NEGATIVE  NEGATIVE mg/dL Final   Protein, ur 64/40/3474 NEGATIVE  NEGATIVE mg/dL Final   Nitrite 25/95/6387 NEGATIVE  NEGATIVE Final   Leukocytes,Ua 06/19/2022 NEGATIVE  NEGATIVE Final   Performed at [facility], [address] [place], [place] [address]   ABO/RH(D) 06/19/2022 O POS   Final   Antibody Screen 06/19/2022 NEG   Final   Sample Expiration 06/19/2022 07/03/2022,2359   Final   Extend sample reason 06/19/2022    Final                   Value:NO TRANSFUSIONS OR PREGNANCY IN THE PAST 3 MONTHS Performed at [facility], [address] [place], [place] [address]     ECG: Date: 06/19/2022 Time ECG obtained: 1309 PM Rate: 53 bpm Rhythm: normal sinus Axis (leads I and aVF): Normal Intervals: QRS 82 ms. QTc 467 ms. ST segment and T wave changes: TWI in lead III Comparison: Previous tracing obtained on 02/28/2022 showed sinus bradycardia at a rate of 51 bpm.  Nonspecific inferior T wave changes present.   IMAGING / PROCEDURES: MR SHOULDER LEFT WO CONTRAST performed on 03/09/2022 Chronic full-thickness tear of the supraspinatus tendon with tendon retraction up to the acromioclavicular joint. Tendinosis with low-grade partial-thickness tear of the infraspinatus tendon about the footprint. Severe tendinosis and thinning of the subscapularis with partial-thickness tear about its insertion. Nonvisualization of the intra-articular long head of the biceps likely sequela of chronic tear. Severe osteoarthritis of the glenohumeral joint.  LEFT HEART CATHETERIZATION performed on 06/04/2020 Left ventricular angiography was not performed EF was normal by echocardiogram Mildly elevated LVEDP Mild to moderate LAD disease 30% distal LAD 40% ostial to mid LAD Recommendations: Continue medical therapy  MYOCARDIAL PERFUSION IMAGING STUDY (LEXISCAN) performed on 06/01/2020 Normal left ventricular systolic function with an EF of 58% Large in size,  moderate in severity, completely reversible defect involving the mid anterior, and lateral, and inferolateral segments, as well as the apical anterior and lateral segments.  Defect is concerning for ischemia, though artifact cannot be excluded due to shifting  breast attenuation. Attenuation correction CT imaging shows coronary artery calcifications, aortic atherosclerosis, and dense mitral annular calcification. Intermediate risk study  TRANSTHORACIC ECHOCARDIOGRAM performed on 05/29/2020 Left ventricular ejection fraction, by estimation, is 60 to 65%. The left ventricle has normal function. The left ventricle has no regional wall motion abnormalities. There is mild left ventricular hypertrophy. Left ventricular diastolic parameters are consistent with Grade I diastolic dysfunction (impaired relaxation).  Right ventricular systolic function is normal. The right ventricular size is normal. Tricuspid regurgitation signal is inadequate for assessing PA pressure.  Left atrial size was moderately dilated.   Impression and Plan:  Tina Pacheco has been referred for pre-anesthesia review and clearance prior to her undergoing the planned anesthetic and procedural courses. Available labs, pertinent testing, and imaging results were personally reviewed by me in preparation for upcoming operative/procedural course. [facility] medical record has been updated following extensive record review and patient interview with PAT staff.   This patient has been appropriately cleared by cardiology with an overall ACCEPTABLE risk of significant perioperative cardiovascular complications. Based on clinical review performed today (06/25/22), barring any significant acute changes in the patient's overall condition, it is anticipated that she will be able to proceed with the planned surgical intervention. Any acute changes in clinical condition may necessitate her procedure being postponed and/or cancelled. Patient will meet with anesthesia team (MD and/or CRNA) on the day of her procedure for preoperative evaluation/assessment. Questions regarding anesthetic course will be fielded at that time.   Pre-surgical instructions were reviewed with the patient during her PAT appointment,  and questions were fielded to satisfaction by PAT clinical staff. She has been instructed on which medications that she will need to hold prior to surgery, as well as the ones that have been deemed safe/appropriate to take of the day of her procedure. As part of the general education provided by PAT, patient made aware both verbally and in writing, that she would need to abstain from the use of any illegal substances during her perioperative course.  She was advised that failure to follow the provided instructions could necessitate case cancellation or result serious perioperative complications up to and including death. Patient encouraged to contact PAT and/or her surgeon's office to discuss any questions or concerns that may arise prior to surgery; verbalized understanding.   Tina Mulling, MSN, APRN, FNP-C, CEN [facility]  Peri-operative Services Nurse Practitioner Phone: [phone] Fax: [phone] 06/25/22 9:37 PM  NOTE: This note has been prepared using Dragon dictation software. Despite my best ability to proofread, there is always the potential that unintentional transcriptional errors may still occur from this process.

## 2022-06-27 ENCOUNTER — Ambulatory Visit: Payer: Medicare Other

## 2022-06-27 ENCOUNTER — Ambulatory Visit: Payer: Medicare Other | Admitting: Urgent Care

## 2022-06-27 ENCOUNTER — Encounter
Admission: RE | Disposition: A | Discharge status: Home / Self Care | Payer: Self-pay | Source: Home / Self Care | Attending: Surgery

## 2022-06-27 ENCOUNTER — Other Ambulatory Visit: Payer: Self-pay

## 2022-06-27 ENCOUNTER — Ambulatory Visit: Payer: Medicare Other | Admitting: Internal Medicine

## 2022-06-27 ENCOUNTER — Ambulatory Visit
Admission: RE | Admit: 2022-06-27 | Discharge: 2022-06-28 | Disposition: A | Discharge status: Home / Self Care | Payer: Medicare Other | Attending: Surgery | Admitting: Surgery

## 2022-06-27 ENCOUNTER — Encounter: Payer: Self-pay | Admitting: Surgery

## 2022-06-27 DIAGNOSIS — Z471 Aftercare following joint replacement surgery: Secondary | ICD-10-CM | POA: Diagnosis not present

## 2022-06-27 DIAGNOSIS — Y792 Prosthetic and other implants, materials and accessory orthopedic devices associated with adverse incidents: Secondary | ICD-10-CM | POA: Insufficient documentation

## 2022-06-27 DIAGNOSIS — Z7901 Long term (current) use of anticoagulants: Secondary | ICD-10-CM | POA: Insufficient documentation

## 2022-06-27 DIAGNOSIS — I2510 Atherosclerotic heart disease of native coronary artery without angina pectoris: Secondary | ICD-10-CM | POA: Diagnosis not present

## 2022-06-27 DIAGNOSIS — T84028A Dislocation of other internal joint prosthesis, initial encounter: Secondary | ICD-10-CM | POA: Insufficient documentation

## 2022-06-27 DIAGNOSIS — M069 Rheumatoid arthritis, unspecified: Secondary | ICD-10-CM | POA: Diagnosis not present

## 2022-06-27 DIAGNOSIS — Z96611 Presence of right artificial shoulder joint: Secondary | ICD-10-CM | POA: Diagnosis not present

## 2022-06-27 DIAGNOSIS — E785 Hyperlipidemia, unspecified: Secondary | ICD-10-CM | POA: Diagnosis not present

## 2022-06-27 DIAGNOSIS — M12812 Other specific arthropathies, not elsewhere classified, left shoulder: Secondary | ICD-10-CM | POA: Diagnosis not present

## 2022-06-27 DIAGNOSIS — D759 Disease of blood and blood-forming organs, unspecified: Secondary | ICD-10-CM | POA: Insufficient documentation

## 2022-06-27 DIAGNOSIS — J449 Chronic obstructive pulmonary disease, unspecified: Secondary | ICD-10-CM | POA: Diagnosis not present

## 2022-06-27 DIAGNOSIS — R7303 Prediabetes: Secondary | ICD-10-CM | POA: Insufficient documentation

## 2022-06-27 DIAGNOSIS — M797 Fibromyalgia: Secondary | ICD-10-CM | POA: Insufficient documentation

## 2022-06-27 DIAGNOSIS — N183 Chronic kidney disease, stage 3 unspecified: Secondary | ICD-10-CM | POA: Insufficient documentation

## 2022-06-27 DIAGNOSIS — I5032 Chronic diastolic (congestive) heart failure: Secondary | ICD-10-CM | POA: Insufficient documentation

## 2022-06-27 DIAGNOSIS — M1990 Unspecified osteoarthritis, unspecified site: Secondary | ICD-10-CM | POA: Insufficient documentation

## 2022-06-27 DIAGNOSIS — I130 Hypertensive heart and chronic kidney disease with heart failure and stage 1 through stage 4 chronic kidney disease, or unspecified chronic kidney disease: Secondary | ICD-10-CM | POA: Diagnosis not present

## 2022-06-27 DIAGNOSIS — G4733 Obstructive sleep apnea (adult) (pediatric): Secondary | ICD-10-CM | POA: Diagnosis not present

## 2022-06-27 DIAGNOSIS — D649 Anemia, unspecified: Secondary | ICD-10-CM | POA: Diagnosis not present

## 2022-06-27 DIAGNOSIS — Z6841 Body Mass Index (BMI) 40.0 and over, adult: Secondary | ICD-10-CM | POA: Insufficient documentation

## 2022-06-27 DIAGNOSIS — Z79891 Long term (current) use of opiate analgesic: Secondary | ICD-10-CM | POA: Insufficient documentation

## 2022-06-27 DIAGNOSIS — I480 Paroxysmal atrial fibrillation: Secondary | ICD-10-CM | POA: Diagnosis not present

## 2022-06-27 DIAGNOSIS — K219 Gastro-esophageal reflux disease without esophagitis: Secondary | ICD-10-CM | POA: Diagnosis not present

## 2022-06-27 DIAGNOSIS — Z01812 Encounter for preprocedural laboratory examination: Secondary | ICD-10-CM

## 2022-06-27 DIAGNOSIS — S42121A Displaced fracture of acromial process, right shoulder, initial encounter for closed fracture: Secondary | ICD-10-CM | POA: Diagnosis not present

## 2022-06-27 DIAGNOSIS — S43024A Posterior dislocation of right humerus, initial encounter: Secondary | ICD-10-CM | POA: Diagnosis not present

## 2022-06-27 HISTORY — DX: Long term (current) use of anticoagulants: Z79.01

## 2022-06-27 HISTORY — DX: Fibromyalgia: M79.7

## 2022-06-27 HISTORY — DX: Unspecified diastolic (congestive) heart failure: I50.30

## 2022-06-27 HISTORY — DX: Hypokalemia: E87.6

## 2022-06-27 HISTORY — PX: TOTAL SHOULDER REVISION: SHX6130

## 2022-06-27 HISTORY — DX: Complete rotator cuff tear or rupture of left shoulder, not specified as traumatic: M75.122

## 2022-06-27 HISTORY — DX: Hypokalemia: T50.2X5A

## 2022-06-27 HISTORY — DX: Repeated falls: R29.6

## 2022-06-27 HISTORY — DX: Major depressive disorder, single episode, unspecified: F32.9

## 2022-06-27 HISTORY — DX: Opioid use, unspecified, uncomplicated: F11.90

## 2022-06-27 HISTORY — DX: Prediabetes: R73.03

## 2022-06-27 LAB — AEROBIC/ANAEROBIC CULTURE W GRAM STAIN (SURGICAL/DEEP WOUND)

## 2022-06-27 SURGERY — REVISION, TOTAL ARTHROPLASTY, SHOULDER
Anesthesia: General | Site: Shoulder | Laterality: Right

## 2022-06-27 MED ORDER — ONDANSETRON HCL 4 MG/2ML IJ SOLN
INTRAMUSCULAR | Status: DC | PRN
  Administered 2022-06-27: 4 mg via INTRAVENOUS

## 2022-06-27 MED ORDER — DEXAMETHASONE SODIUM PHOSPHATE 10 MG/ML IJ SOLN
INTRAMUSCULAR | Status: DC | PRN
  Administered 2022-06-27: 10 mg via INTRAVENOUS

## 2022-06-27 MED ORDER — ACETAMINOPHEN 10 MG/ML IV SOLN
INTRAVENOUS | Status: DC | PRN
  Administered 2022-06-27: 1000 mg via INTRAVENOUS

## 2022-06-27 MED ORDER — AMIODARONE HCL 200 MG PO TABS
200.0000 mg | ORAL_TABLET | Freq: Every day | ORAL | Status: DC
  Administered 2022-06-27: 200 mg via ORAL

## 2022-06-27 MED ORDER — KETAMINE HCL 50 MG/5ML IJ SOSY
PREFILLED_SYRINGE | INTRAMUSCULAR | Status: AC
  Filled 2022-06-27: qty 5

## 2022-06-27 MED ORDER — CEFAZOLIN SODIUM-DEXTROSE 2-4 GM/100ML-% IV SOLN
2.0000 g | Freq: Four times a day (QID) | INTRAVENOUS | Status: AC
  Administered 2022-06-27 – 2022-06-28 (×3): 2 g via INTRAVENOUS

## 2022-06-27 MED ORDER — APIXABAN 2.5 MG PO TABS
5.0000 mg | ORAL_TABLET | Freq: Two times a day (BID) | ORAL | Status: DC
  Administered 2022-06-28: 5 mg via ORAL

## 2022-06-27 MED ORDER — OXYCODONE HCL 5 MG PO TABS
ORAL_TABLET | ORAL | Status: AC
  Filled 2022-06-27: qty 2

## 2022-06-27 MED ORDER — LIDOCAINE HCL (CARDIAC) PF 100 MG/5ML IV SOSY
PREFILLED_SYRINGE | INTRAVENOUS | Status: DC | PRN
  Administered 2022-06-27: 100 mg via INTRAVENOUS

## 2022-06-27 MED ORDER — CEFAZOLIN SODIUM-DEXTROSE 2-4 GM/100ML-% IV SOLN
INTRAVENOUS | Status: AC
  Filled 2022-06-27: qty 100

## 2022-06-27 MED ORDER — EPINEPHRINE PF 1 MG/ML IJ SOLN
INTRAMUSCULAR | Status: AC
  Filled 2022-06-27: qty 1

## 2022-06-27 MED ORDER — SODIUM CHLORIDE 0.9 % IV SOLN: INTRAVENOUS | Status: DC

## 2022-06-27 MED ORDER — ACETAMINOPHEN 325 MG PO TABS: 325.0000 mg | ORAL_TABLET | Freq: Four times a day (QID) | ORAL | Status: DC | PRN

## 2022-06-27 MED ORDER — SUGAMMADEX SODIUM 200 MG/2ML IV SOLN
INTRAVENOUS | Status: DC | PRN
  Administered 2022-06-27: 200 mg via INTRAVENOUS

## 2022-06-27 MED ORDER — PROPOFOL 1000 MG/100ML IV EMUL
INTRAVENOUS | Status: AC
  Filled 2022-06-27: qty 100

## 2022-06-27 MED ORDER — TERAZOSIN HCL 5 MG PO CAPS
10.0000 mg | ORAL_CAPSULE | Freq: Every day | ORAL | Status: DC
  Administered 2022-06-27: 10 mg via ORAL
  Filled 2022-06-27: qty 2

## 2022-06-27 MED ORDER — CHLORHEXIDINE GLUCONATE 0.12 % MT SOLN
15.0000 mL | Freq: Once | OROMUCOSAL | Status: AC
  Administered 2022-06-27: 15 mL via OROMUCOSAL

## 2022-06-27 MED ORDER — MIDAZOLAM HCL 2 MG/2ML IJ SOLN
INTRAMUSCULAR | Status: AC
  Filled 2022-06-27: qty 2

## 2022-06-27 MED ORDER — HYDROMORPHONE HCL 1 MG/ML IJ SOLN
INTRAMUSCULAR | Status: AC
  Filled 2022-06-27: qty 1

## 2022-06-27 MED ORDER — ATORVASTATIN CALCIUM 20 MG PO TABS
40.0000 mg | ORAL_TABLET | Freq: Every day | ORAL | Status: DC
  Administered 2022-06-28: 40 mg via ORAL

## 2022-06-27 MED ORDER — ACETAMINOPHEN 500 MG PO TABS
ORAL_TABLET | ORAL | Status: AC
  Filled 2022-06-27: qty 2

## 2022-06-27 MED ORDER — OXYCODONE HCL 5 MG/5ML PO SOLN: 5.0000 mg | Freq: Once | ORAL | Status: AC | PRN

## 2022-06-27 MED ORDER — OXYCODONE HCL 5 MG PO TABS
ORAL_TABLET | ORAL | Status: AC
  Filled 2022-06-27: qty 1

## 2022-06-27 MED ORDER — TRIAMCINOLONE ACETONIDE 40 MG/ML IJ SUSP
INTRAMUSCULAR | Status: DC | PRN
  Administered 2022-06-27: 80 mg

## 2022-06-27 MED ORDER — OXYCODONE HCL 5 MG PO TABS
5.0000 mg | ORAL_TABLET | ORAL | Status: DC | PRN
  Administered 2022-06-27 – 2022-06-28 (×3): 10 mg via ORAL

## 2022-06-27 MED ORDER — SODIUM CHLORIDE 0.9 % IV SOLN
INTRAVENOUS | Status: DC | PRN
  Administered 2022-06-27: 20 mL

## 2022-06-27 MED ORDER — LACTATED RINGERS IV SOLN: INTRAVENOUS | Status: DC

## 2022-06-27 MED ORDER — MIDAZOLAM HCL 2 MG/2ML IJ SOLN
INTRAMUSCULAR | Status: DC | PRN
  Administered 2022-06-27 (×2): 1 mg via INTRAVENOUS

## 2022-06-27 MED ORDER — METOCLOPRAMIDE HCL 10 MG PO TABS: 5.0000 mg | ORAL_TABLET | Freq: Three times a day (TID) | ORAL | Status: DC | PRN

## 2022-06-27 MED ORDER — POLYETHYLENE GLYCOL 3350 17 G PO PACK: 17.0000 g | PACK | Freq: Every day | ORAL | Status: DC | PRN

## 2022-06-27 MED ORDER — GLYCOPYRROLATE 0.2 MG/ML IJ SOLN
INTRAMUSCULAR | Status: DC | PRN
  Administered 2022-06-27: .2 mg via INTRAVENOUS

## 2022-06-27 MED ORDER — DIPHENHYDRAMINE HCL 12.5 MG/5ML PO ELIX: 12.5000 mg | ORAL_SOLUTION | ORAL | Status: DC | PRN

## 2022-06-27 MED ORDER — FENTANYL CITRATE (PF) 100 MCG/2ML IJ SOLN
INTRAMUSCULAR | Status: AC
  Filled 2022-06-27: qty 2

## 2022-06-27 MED ORDER — TRANEXAMIC ACID 1000 MG/10ML IV SOLN
INTRAVENOUS | Status: AC
  Filled 2022-06-27: qty 10

## 2022-06-27 MED ORDER — KETOROLAC TROMETHAMINE 30 MG/ML IJ SOLN
INTRAMUSCULAR | Status: DC | PRN
  Administered 2022-06-27: 30 mg via INTRAMUSCULAR

## 2022-06-27 MED ORDER — METOPROLOL SUCCINATE ER 25 MG PO TB24
50.0000 mg | ORAL_TABLET | Freq: Every day | ORAL | Status: DC
  Administered 2022-06-27: 50 mg via ORAL
  Filled 2022-06-27: qty 2

## 2022-06-27 MED ORDER — TRIAMCINOLONE ACETONIDE 40 MG/ML IJ SUSP
INTRAMUSCULAR | Status: AC
  Filled 2022-06-27: qty 2

## 2022-06-27 MED ORDER — BUPIVACAINE HCL (PF) 0.5 % IJ SOLN
INTRAMUSCULAR | Status: AC
  Filled 2022-06-27: qty 30

## 2022-06-27 MED ORDER — METOCLOPRAMIDE HCL 5 MG/ML IJ SOLN: 5.0000 mg | Freq: Three times a day (TID) | INTRAMUSCULAR | Status: DC | PRN

## 2022-06-27 MED ORDER — OXYCODONE HCL 5 MG PO TABS
5.0000 mg | ORAL_TABLET | Freq: Once | ORAL | Status: AC | PRN
  Administered 2022-06-27: 5 mg via ORAL

## 2022-06-27 MED ORDER — ONDANSETRON HCL 4 MG/2ML IJ SOLN: 4.0000 mg | Freq: Four times a day (QID) | INTRAMUSCULAR | Status: DC | PRN

## 2022-06-27 MED ORDER — AMIODARONE HCL 200 MG PO TABS
ORAL_TABLET | ORAL | Status: AC
  Filled 2022-06-27: qty 1

## 2022-06-27 MED ORDER — PANTOPRAZOLE SODIUM 40 MG PO TBEC: 40.0000 mg | DELAYED_RELEASE_TABLET | Freq: Every day | ORAL | Status: DC

## 2022-06-27 MED ORDER — BUPIVACAINE LIPOSOME 1.3 % IJ SUSP
INTRAMUSCULAR | Status: AC
  Filled 2022-06-27: qty 10

## 2022-06-27 MED ORDER — SODIUM CHLORIDE 0.9 % IR SOLN
Status: DC | PRN
  Administered 2022-06-27: 1500 mL

## 2022-06-27 MED ORDER — KETAMINE HCL 10 MG/ML IJ SOLN
INTRAMUSCULAR | Status: DC | PRN
  Administered 2022-06-27: 50 mg via INTRAVENOUS

## 2022-06-27 MED ORDER — BISACODYL 10 MG RE SUPP: 10.0000 mg | Freq: Every day | RECTAL | Status: DC | PRN

## 2022-06-27 MED ORDER — CEFAZOLIN SODIUM-DEXTROSE 2-4 GM/100ML-% IV SOLN
2.0000 g | INTRAVENOUS | Status: AC
  Administered 2022-06-27: 2 g via INTRAVENOUS

## 2022-06-27 MED ORDER — ORAL CARE MOUTH RINSE: 15.0000 mL | Freq: Once | OROMUCOSAL | Status: AC

## 2022-06-27 MED ORDER — DOCUSATE SODIUM 100 MG PO CAPS
100.0000 mg | ORAL_CAPSULE | Freq: Two times a day (BID) | ORAL | Status: DC
  Administered 2022-06-27 – 2022-06-28 (×2): 100 mg via ORAL

## 2022-06-27 MED ORDER — DOCUSATE SODIUM 100 MG PO CAPS
100.0000 mg | ORAL_CAPSULE | Freq: Two times a day (BID) | ORAL | Status: DC
Start: 2022-06-27 — End: 2022-06-27

## 2022-06-27 MED ORDER — HYDROMORPHONE HCL 1 MG/ML IJ SOLN
INTRAMUSCULAR | Status: DC | PRN
  Administered 2022-06-27: 1 mg via INTRAVENOUS

## 2022-06-27 MED ORDER — ROCURONIUM BROMIDE 100 MG/10ML IV SOLN
INTRAVENOUS | Status: DC | PRN
  Administered 2022-06-27 (×2): 20 mg via INTRAVENOUS
  Administered 2022-06-27: 30 mg via INTRAVENOUS

## 2022-06-27 MED ORDER — BUPIVACAINE-EPINEPHRINE (PF) 0.5% -1:200000 IJ SOLN
INTRAMUSCULAR | Status: DC | PRN
  Administered 2022-06-27: 30 mL

## 2022-06-27 MED ORDER — DOCUSATE SODIUM 100 MG PO CAPS
ORAL_CAPSULE | ORAL | Status: AC
  Filled 2022-06-27: qty 1

## 2022-06-27 MED ORDER — PROPOFOL 10 MG/ML IV BOLUS
INTRAVENOUS | Status: DC | PRN
  Administered 2022-06-27: 30 mg via INTRAVENOUS
  Administered 2022-06-27: 100 mg via INTRAVENOUS

## 2022-06-27 MED ORDER — FENTANYL CITRATE (PF) 100 MCG/2ML IJ SOLN
INTRAMUSCULAR | Status: DC | PRN
  Administered 2022-06-27 (×2): 50 ug via INTRAVENOUS

## 2022-06-27 MED ORDER — SUCCINYLCHOLINE CHLORIDE 200 MG/10ML IV SOSY
PREFILLED_SYRINGE | INTRAVENOUS | Status: DC | PRN
  Administered 2022-06-27: 120 mg via INTRAVENOUS

## 2022-06-27 MED ORDER — ACETAMINOPHEN 10 MG/ML IV SOLN
INTRAVENOUS | Status: AC
  Filled 2022-06-27: qty 100

## 2022-06-27 MED ORDER — TORSEMIDE 20 MG PO TABS
40.0000 mg | ORAL_TABLET | Freq: Every day | ORAL | Status: DC
  Filled 2022-06-27 (×2): qty 2

## 2022-06-27 MED ORDER — NYSTATIN 100000 UNIT/GM EX POWD
1.0000 | Freq: Three times a day (TID) | CUTANEOUS | Status: DC
  Filled 2022-06-27: qty 15

## 2022-06-27 MED ORDER — SPIRONOLACTONE 25 MG PO TABS
25.0000 mg | ORAL_TABLET | Freq: Every day | ORAL | Status: DC
  Filled 2022-06-27 (×2): qty 1

## 2022-06-27 MED ORDER — ONDANSETRON HCL 4 MG PO TABS: 4.0000 mg | ORAL_TABLET | Freq: Four times a day (QID) | ORAL | Status: DC | PRN

## 2022-06-27 MED ORDER — MAGNESIUM HYDROXIDE 400 MG/5ML PO SUSP: 30.0000 mL | Freq: Every day | ORAL | Status: DC | PRN

## 2022-06-27 MED ORDER — ACETAMINOPHEN 500 MG PO TABS
1000.0000 mg | ORAL_TABLET | Freq: Four times a day (QID) | ORAL | Status: AC
  Administered 2022-06-27 – 2022-06-28 (×4): 1000 mg via ORAL

## 2022-06-27 MED ORDER — EPHEDRINE SULFATE (PRESSORS) 50 MG/ML IJ SOLN
INTRAMUSCULAR | Status: DC | PRN
  Administered 2022-06-27: 5 mg via INTRAVENOUS
  Administered 2022-06-27 (×2): 10 mg via INTRAVENOUS

## 2022-06-27 MED ORDER — MONTELUKAST SODIUM 10 MG PO TABS
10.0000 mg | ORAL_TABLET | Freq: Every day | ORAL | Status: DC
  Administered 2022-06-27: 10 mg via ORAL
  Filled 2022-06-27: qty 1

## 2022-06-27 MED ORDER — NOREPINEPHRINE 4 MG/250ML-% IV SOLN
INTRAVENOUS | Status: AC
  Filled 2022-06-27: qty 250

## 2022-06-27 MED ORDER — PREDNISONE 5 MG PO TABS
5.0000 mg | ORAL_TABLET | Freq: Every day | ORAL | Status: DC
  Administered 2022-06-27: 5 mg via ORAL
  Filled 2022-06-27: qty 1

## 2022-06-27 MED ORDER — 0.9 % SODIUM CHLORIDE (POUR BTL) OPTIME
TOPICAL | Status: DC | PRN
  Administered 2022-06-27: 500 mL

## 2022-06-27 MED ORDER — POTASSIUM CHLORIDE CRYS ER 20 MEQ PO TBCR: 20.0000 meq | EXTENDED_RELEASE_TABLET | Freq: Every day | ORAL | Status: DC

## 2022-06-27 MED ORDER — FENTANYL CITRATE (PF) 100 MCG/2ML IJ SOLN: 25.0000 ug | INTRAMUSCULAR | Status: DC | PRN

## 2022-06-27 MED ORDER — FLEET ENEMA 7-19 GM/118ML RE ENEM: 1.0000 | ENEMA | Freq: Once | RECTAL | Status: DC | PRN

## 2022-06-27 MED ORDER — CHLORHEXIDINE GLUCONATE 0.12 % MT SOLN
OROMUCOSAL | Status: AC
  Filled 2022-06-27: qty 15

## 2022-06-27 SURGICAL SUPPLY — 62 items
APL PRP STRL LF DISP 70% ISPRP (MISCELLANEOUS) ×4
BLADE SAW SAG 25X90X1.19 (BLADE) ×2 IMPLANT
CHLORAPREP W/TINT 26 (MISCELLANEOUS) ×3 IMPLANT
CNTNR URN SCR LID CUP LEK RST (MISCELLANEOUS) ×2 IMPLANT
CONT SPEC 4OZ STRL OR WHT (MISCELLANEOUS) ×4
COOLER POLAR GLACIER W/PUMP (MISCELLANEOUS) ×2 IMPLANT
COVER BACK TABLE REUSABLE LG (DRAPES) ×2 IMPLANT
DRAPE 3/4 80X56 (DRAPES) ×2 IMPLANT
DRAPE INCISE IOBAN 66X45 STRL (DRAPES) ×2 IMPLANT
DRSG OPSITE POSTOP 4X8 (GAUZE/BANDAGES/DRESSINGS) ×2 IMPLANT
ELECT BLADE 6.5 EXT (BLADE) ×1 IMPLANT
ELECT CAUTERY BLADE 6.4 (BLADE) ×2 IMPLANT
ELECT REM PT RETURN 9FT ADLT (ELECTROSURGICAL) ×2
ELECTRODE REM PT RTRN 9FT ADLT (ELECTROSURGICAL) ×2 IMPLANT
GAUZE XEROFORM 1X8 LF (GAUZE/BANDAGES/DRESSINGS) ×2 IMPLANT
GLENOSPHERE RSS CONCENTRIC-S 5 (Shoulder) ×1 IMPLANT
GLOVE BIO SURGEON STRL SZ7.5 (GLOVE) ×8 IMPLANT
GLOVE BIO SURGEON STRL SZ8 (GLOVE) ×8 IMPLANT
GLOVE BIOGEL PI IND STRL 8 (GLOVE) ×4 IMPLANT
GLOVE INDICATOR 8.0 STRL GRN (GLOVE) ×2 IMPLANT
GLOVE SURG SYN 6.5 ES PF (GLOVE) ×4 IMPLANT
GLOVE SURG SYN 6.5 PF PI (GLOVE) IMPLANT
GOWN STRL REUS W/ TWL LRG LVL3 (GOWN DISPOSABLE) ×2 IMPLANT
GOWN STRL REUS W/ TWL XL LVL3 (GOWN DISPOSABLE) ×2 IMPLANT
GOWN STRL REUS W/TWL LRG LVL3 (GOWN DISPOSABLE) ×2
GOWN STRL REUS W/TWL XL LVL3 (GOWN DISPOSABLE) ×2
HOOD PEEL AWAY T7 (MISCELLANEOUS) ×6 IMPLANT
IV NS IRRIG 3000ML ARTHROMATIC (IV SOLUTION) ×2 IMPLANT
KIT STABILIZATION SHOULDER (MISCELLANEOUS) ×2 IMPLANT
KIT TURNOVER KIT A (KITS) ×2 IMPLANT
LINER STD 38X20X1.25 +3R (Liner) ×1 IMPLANT
MANIFOLD NEPTUNE II (INSTRUMENTS) ×2 IMPLANT
MASK FACE SPIDER DISP (MASK) ×2 IMPLANT
MAT ABSORB  FLUID 56X50 GRAY (MISCELLANEOUS) ×2
MAT ABSORB FLUID 56X50 GRAY (MISCELLANEOUS) ×2 IMPLANT
NDL MAYO CATGUT SZ1 (NEEDLE) IMPLANT
NDL SAFETY ECLIP 18X1.5 (MISCELLANEOUS) ×2 IMPLANT
NDL SPNL 20GX3.5 QUINCKE YW (NEEDLE) ×1 IMPLANT
NEEDLE MAYO CATGUT SZ1 (NEEDLE) IMPLANT
NEEDLE SPNL 20GX3.5 QUINCKE YW (NEEDLE) ×2 IMPLANT
NS IRRIG 500ML POUR BTL (IV SOLUTION) ×2 IMPLANT
PACK ARTHROSCOPY SHOULDER (MISCELLANEOUS) ×2 IMPLANT
PAD ARMBOARD 7.5X6 YLW CONV (MISCELLANEOUS) ×2 IMPLANT
PAD WRAPON POLAR SHDR UNIV (MISCELLANEOUS) ×2 IMPLANT
PULSAVAC PLUS IRRIG FAN TIP (DISPOSABLE) ×2
SLING ULTRA II M (MISCELLANEOUS) ×1 IMPLANT
SPONGE T-LAP 18X18 ~~LOC~~+RFID (SPONGE) ×4 IMPLANT
STAPLER SKIN PROX 35W (STAPLE) ×2 IMPLANT
SUT ETHIBOND 0 MO6 C/R (SUTURE) ×2 IMPLANT
SUT FIBERWIRE #2 38 BLUE 1/2 (SUTURE) ×8
SUT VIC AB 0 CT1 36 (SUTURE) ×2 IMPLANT
SUT VIC AB 2-0 CT1 27 (SUTURE) ×4
SUT VIC AB 2-0 CT1 TAPERPNT 27 (SUTURE) ×4 IMPLANT
SUTURE FIBERWR #2 38 BLUE 1/2 (SUTURE) ×8 IMPLANT
SWAB CULTURE AMIES ANAERIB BLU (MISCELLANEOUS) ×1 IMPLANT
SYR 10ML LL (SYRINGE) ×2 IMPLANT
SYR 30ML LL (SYRINGE) ×2 IMPLANT
SYR TOOMEY 50ML (SYRINGE) ×2 IMPLANT
TIP FAN IRRIG PULSAVAC PLUS (DISPOSABLE) ×2 IMPLANT
TRAP FLUID SMOKE EVACUATOR (MISCELLANEOUS) ×2 IMPLANT
WATER STERILE IRR 500ML POUR (IV SOLUTION) ×2 IMPLANT
WRAPON POLAR PAD SHDR UNIV (MISCELLANEOUS) ×2

## 2022-06-27 NOTE — Transfer of Care (Signed)
Immediate Anesthesia Transfer of Care Note  Patient: Tina Pacheco  Procedure(s) Performed: REVERSE SHOULDER ARTHROPLASTY WITH BICEPS TENODESIS. (Left: Shoulder) CLOSED REDUCTION SHOULDER (Right: Shoulder)  Patient Location: PACU  Anesthesia Type:General  Level of Consciousness: awake, alert , and oriented  Airway & Oxygen Therapy: Patient Spontanous Breathing  Post-op Assessment: Report given to RN and Post -op Vital signs reviewed and stable  Post vital signs: stable  Last Vitals:  Vitals Value Taken Time  BP 128/55 06/27/22 1046  Temp 36.2 C 06/27/22 1046  Pulse 60 06/27/22 1051  Resp 16 06/27/22 1051  SpO2 96 % 06/27/22 1051  Vitals shown include unvalidated device data.  Last Pain:  Vitals:   06/27/22 0630  TempSrc: Tympanic  PainSc: 9       Patients Stated Pain Goal: 3 (06/27/22 0630)  Complications: No notable events documented.

## 2022-06-27 NOTE — Anesthesia Preprocedure Evaluation (Addendum)
Anesthesia Evaluation  Patient identified by MRN, date of birth, ID band Patient awake    Reviewed: Allergy & Precautions, NPO status , Patient's Chart, lab work & pertinent test results  History of Anesthesia Complications Negative for: history of anesthetic complications  Airway Mallampati: IV  TM Distance: >3 FB Neck ROM: full    Dental  (+) Poor Dentition   Pulmonary shortness of breath, with exertion, at rest and lying, asthma , sleep apnea , COPD   Pulmonary exam normal        Cardiovascular hypertension, On Medications and On Home Beta Blockers (-) angina + CAD and +CHF  Normal cardiovascular exam+ dysrhythmias Atrial Fibrillation  Rhythm:Regular Rate:Normal  Most recent TTE was performed on 05/29/2020 revealing normal left ventricular systolic function with an EF of 60-65%. There were no regional wall motion abnormalities. There was mild LVH. Left atrium was moderately enlarged. No significant valvular regurgitation observed. All transvalvular gradients were noted to be normal providing no evidence suggestive of valvular stenosis.    Neuro/Psych  Headaches PSYCHIATRIC DISORDERS Anxiety Depression     Neuromuscular disease    GI/Hepatic Neg liver ROS,GERD  Medicated,,  Endo/Other    Morbid obesity  Renal/GU Renal disease     Musculoskeletal  (+) Arthritis , Osteoarthritis and Rheumatoid disorders,  Fibromyalgia -, narcotic dependent  Abdominal   Peds  Hematology  (+) Blood dyscrasia, anemia On eliquis    Anesthesia Other Findings Past Medical History: No date: (HFpEF) heart failure with preserved ejection fraction (HCC)     Comment:  a.) TTE 08/10/2014: EF 55-60%, mild MAC, mild LAE, mild               MR, PASP 38; b.) TTE 03/31/2019: EF 55-60%, mild LVH,               mild-mod LAW, mod MAC, mild-mod AoV sclerosis, triv PR,               G1DD; c.) TTE 05/29/2020: EF 60-65%, mild LVH, mod LAE,                G1DD No date: Anemia No date: Aortic atherosclerosis (HCC) No date: Asthma No date: Basal cell carcinoma No date: CAD (coronary artery disease)     Comment:  a.) LHC 05/24/2010 - nonobstructive CAD - med mgmt; b.)               LHC 06/04/2020: 30% dLAD, 40% o-mLAD - med mgmt No date: Chronic, continuous use of opioids No date: Claustrophobia No date: COPD (chronic obstructive pulmonary disease) (HCC) No date: DDD (degenerative disc disease), lumbosacral No date: Diuretic-induced hypokalemia No date: Dyslipidemia No date: Fibromyalgia No date: Frequent falls No date: GERD (gastroesophageal reflux disease) No date: 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: History of bilateral cataract extraction 2003: Hyperplastic colonic polyp No date: Hypertension No date: Long term current use of amiodarone No date: Long term current use of anticoagulant     Comment:  a.) apixaban No date: Long term current use of immunosuppressive drug     Comment:  a.) certolizumab pegol  + predisone for RA diagnosis No date: MDD (major depressive disorder) No date: Migraines No date: Morbid obesity (HCC) No date: Nontraumatic complete tear of rotator cuff, left No date: OSA (obstructive sleep apnea)     Comment:  a.) non-complaint with prescribed nocturnal PAP therapy No date: Osteoarthritis No date: PAF (paroxysmal atrial fibrillation) (HCC)     Comment:  a.) CHA2DS2-VASc = 5 (age,  sex, CHF, HTN, vascular               disease history); b.) rate and rhythm maintained on oral               amiodarone + metoprolol succinate; chronically               anticoagulated with standard dose apixaban No date: Pneumonia No date: Prediabetes No date: Rheumatoid arthritis (HCC)     Comment:  a.) on certolizumab pegol + predisone No date: Stage 3 chronic kidney disease (HCC)  Past Surgical History: No date: ABDOMINAL HYSTERECTOMY 08/09/2017: APPLICATION OF WOUND VAC; Right     Comment:  Procedure:  APPLICATION OF WOUND VAC;  Surgeon: Recardo Evangelist, DPM;  Location: [facility];  Service: Podiatry;                Laterality: Right; 05/24/2010: CARDIAC CATHETERIZATION     Comment:  nonobstructive CAD 06/12/2016: CATARACT EXTRACTION W/ INTRAOCULAR LENS IMPLANT; Right     Comment:  Dr. Mia Creek 06/26/2016: CATARACT EXTRACTION W/ INTRAOCULAR LENS IMPLANT; Left     Comment:  Dr. Mia Creek No date: CESAREAN SECTION No date: CHOLECYSTECTOMY 06/12/2011: COLONOSCOPY     Comment:  Procedure: COLONOSCOPY;  Surgeon: Charna Elizabeth, MD;                Location: [facility];  Service: Endoscopy;  Laterality:              N/A; 03/17/2013: COLONOSCOPY; N/A     Comment:  Procedure: COLONOSCOPY;  Surgeon: Charna Elizabeth, MD;                Location: [facility];  Service: Endoscopy;  Laterality:              N/A; 07/13/2017: FOOT ARTHRODESIS; Right     Comment:  Procedure: ARTHRODESIS FOOT-MULTI.FUSIONS (6 JOINTS);                Surgeon: Recardo Evangelist, DPM;  Location: [facility];                Service: Podiatry;  Laterality: Right; 08/09/2017: IRRIGATION AND DEBRIDEMENT FOOT; Right     Comment:  Procedure: IRRIGATION AND DEBRIDEMENT FOOT;  Surgeon:               Recardo Evangelist, DPM;  Location: [facility];  Service:               Podiatry;  Laterality: Right; No date: KNEE ARTHROSCOPY; Bilateral     Comment:  bilateral 06/04/2020: LEFT HEART CATH AND CORONARY ANGIOGRAPHY; N/A     Comment:  Procedure: LEFT HEART CATH AND CORONARY ANGIOGRAPHY;                Surgeon: Iran Ouch, MD;  Location: [facility]               [facility];  Service: Cardiovascular;  Laterality: N/A; 11/16/2020: REVERSE SHOULDER ARTHROPLASTY; Right     Comment:  Procedure: REVERSE SHOULDER ARTHROPLASTY;  Surgeon:               Christena Flake, MD;  Location: [facility];  Service:               Orthopedics;  Laterality: Right; 11/11/2020: SHOULDER CLOSED REDUCTION; Right     Comment:  Procedure:  CLOSED REDUCTION SHOULDER;  Surgeon: Joice Lofts,  Excell Seltzer, MD;  Location: [facility];  Service: Orthopedics;                Laterality: Right; 11/11/2020: SHOULDER INJECTION; Right     Comment:  Procedure: SHOULDER INJECTION;  Surgeon: Christena Flake,               MD;  Location: [facility];  Service: Orthopedics;                Laterality: Right; No date: SINUSOTOMY 12/2016: TOOTH EXTRACTION  BMI    Body Mass Index: 44.99 kg/m      Reproductive/Obstetrics negative OB ROS                             Anesthesia Physical Anesthesia Plan  ASA: 4  Anesthesia Plan: General ETT   Post-op Pain Management: Ofirmev IV (intra-op)* and Ketamine IV*   Induction: Intravenous  PONV Risk Score and Plan: 3 and Ondansetron, Dexamethasone and Treatment may vary due to age or medical condition  Airway Management Planned: Oral ETT  Additional Equipment:   Intra-op Plan:   Post-operative Plan: Extubation in OR  Informed Consent: I have reviewed the patients History and Physical, chart, labs and discussed the procedure including the risks, benefits and alternatives for the proposed anesthesia with the patient or authorized representative who has indicated his/her understanding and acceptance.     Dental Advisory Given  Plan Discussed with: Anesthesiologist, CRNA and Surgeon  Anesthesia Plan Comments: (Patient consented for risks of anesthesia including but not limited to:  - adverse reactions to medications - damage to eyes, teeth, lips or other oral mucosa - nerve damage due to positioning  - sore throat or hoarseness - Damage to heart, brain, nerves, lungs, other parts of body or loss of life  Patient voiced understanding.)       Anesthesia Quick Evaluation

## 2022-06-27 NOTE — Evaluation (Signed)
Physical Therapy Evaluation Patient Details Name: Tina Pacheco MRN: [id number] DOB: 02-Nov-1950 Today's Date: 06/27/2022  History of Present Illness  Pt is a 72 y/o F admitted on 06/27/22 for scheduled L shoulder surgery but upon arrival reports she felt a pop in her R shoulder resulting in ongoing pain a couple days prior. R shoulder x-ray revealed posterior dislocation of reverse R TSA. L shoulder sx was canceled & pt instead underwent revision of glenosphere & humeral insert s/p prior reverse R TSA. PMH: asthma, a-fib, claustrophobia, collagen vascular disease, COPD, dyslipidemia, gout, hemihypertrophy, HLD, HTN, neuropathy, OA, RA, sleep apnea  Clinical Impression  Pt seen for PT evaluation with pt agreeable to tx. Pt reports prior to admission she only ambulated to bathroom, without AD, otherwise she used w/c for mobility. On this date, pt is able to complete supine>sit with mod I with hospital bed features, STS with CGA, and ambulate room<>bathroom with hemiwalker & CGA. Pt presents with slow, steady gait, no overt LOB & able to toilet with CGA. Recommend ongoing PT services to address balance, strengthening & safety with mobility.   Recommendations for follow up therapy are one component of a multi-disciplinary discharge planning process, led by the attending physician.  Recommendations may be updated based on patient status, additional functional criteria and insurance authorization.  Follow Up Recommendations       Assistance Recommended at Discharge Intermittent Supervision/Assistance  Patient can return home with the following  A little help with walking and/or transfers;Assistance with cooking/housework;A little help with bathing/dressing/bathroom;Help with stairs or ramp for entrance;Assist for transportation    Equipment Recommendations None recommended by PT  Recommendations for Other Services       Functional Status Assessment Patient has had a recent decline in their  functional status and demonstrates the ability to make significant improvements in function in a reasonable and predictable amount of time.     Precautions / Restrictions Precautions Precautions: Fall;Shoulder Shoulder Interventions: Shoulder sling/immobilizer;At all times Restrictions Weight Bearing Restrictions: Yes RUE Weight Bearing: Non weight bearing      Mobility  Bed Mobility Overal bed mobility: Modified Independent, Needs Assistance Bed Mobility: Supine to Sit     Supine to sit: Modified independent (Device/Increase time), HOB elevated     General bed mobility comments: use of bed rails, HOB elevated, pt exited R side of bed    Transfers Overall transfer level: Needs assistance Equipment used: Hemi-walker (grab bar in bathroom) Transfers: Sit to/from Stand Sit to Stand: Min guard, Supervision                Ambulation/Gait Ambulation/Gait assistance: Min guard Gait Distance (Feet): 25 Feet (+ 25) Assistive device: Hemi-walker Gait Pattern/deviations: Decreased step length - right, Decreased step length - left, Decreased stride length Gait velocity: decreased        Stairs            Wheelchair Mobility    Modified Rankin (Stroke Patients Only)       Balance Overall balance assessment: Needs assistance Sitting-balance support: Feet supported Sitting balance-Leahy Scale: Good Sitting balance - Comments: Pt performs peri hygiene on toilet without LOB   Standing balance support: Single extremity supported, No upper extremity supported Standing balance-Leahy Scale: Fair                               Pertinent Vitals/Pain Pain Assessment Pain Assessment: Faces Faces Pain Scale: Hurts little more Pain Location:  back>R shoulder Pain Descriptors / Indicators: Discomfort Pain Intervention(s): Monitored during session    Home Living Family/patient expects to be discharged to:: Private residence Living Arrangements:  Spouse/significant other Available Help at Discharge: Family Type of Home: House Home Access: Ramped entrance       Home Layout: One level Home Equipment: Rollator (4 wheels);BSC/3in1;Hospital bed;Rolling Walker (2 wheels) (hemiwalker) Additional Comments: Pt reports she lives with her spouse but her daughter & family are moving in with her this weekend & family can assist her.    Prior Function               Mobility Comments: Pt reports she ambulated short household distances to walker & back, originially with hemiwalker but now without AD, instead holding to furniture. Pt notes that's the longest distance she walks, otherwise she uses her w/c.       Hand Dominance   Dominant Hand: Right    Extremity/Trunk Assessment   Upper Extremity Assessment Upper Extremity Assessment: RUE deficits/detail RUE Deficits / Details: RUE in immobilizer s/p sx    Lower Extremity Assessment Lower Extremity Assessment: Generalized weakness       Communication   Communication: No difficulties  Cognition Arousal/Alertness: Awake/alert Behavior During Therapy: WFL for tasks assessed/performed Overall Cognitive Status: Within Functional Limits for tasks assessed                                          General Comments General comments (skin integrity, edema, etc.): Pt received on supplemental O2, weaned to room air & left on room air. SpO2 >90% on room air.    Exercises Other Exercises Other Exercises: Pt reviewed NWB RUE with immobilizer at all times.   Assessment/Plan    PT Assessment Patient needs continued PT services  PT Problem List Decreased strength;Decreased mobility;Decreased range of motion;Decreased activity tolerance;Decreased balance;Decreased knowledge of use of DME;Decreased coordination;Decreased knowledge of precautions;Cardiopulmonary status limiting activity;Impaired sensation;Pain       PT Treatment Interventions DME instruction;Therapeutic  activities;Gait training;Patient/family education;Modalities;Therapeutic exercise;Stair training;Balance training;Functional mobility training;Neuromuscular re-education;Manual techniques    PT Goals (Current goals can be found in the Care Plan section)  Acute Rehab PT Goals Patient Stated Goal: go home PT Goal Formulation: With patient Time For Goal Achievement: 07/11/22 Potential to Achieve Goals: Good    Frequency Min 2X/week     Co-evaluation               AM-PAC PT "6 Clicks" Mobility  Outcome Measure Help needed turning from your back to your side while in a flat bed without using bedrails?: None Help needed moving from lying on your back to sitting on the side of a flat bed without using bedrails?: A Little Help needed moving to and from a bed to a chair (including a wheelchair)?: A Little Help needed standing up from a chair using your arms (e.g., wheelchair or bedside chair)?: A Little Help needed to walk in hospital room?: A Little Help needed climbing 3-5 steps with a railing? : A Lot 6 Click Score: 18    End of Session   Activity Tolerance: Patient tolerated treatment well Patient left: in chair;with call bell/phone within reach Nurse Communication: Mobility status PT Visit Diagnosis: Unsteadiness on feet (R26.81);Muscle weakness (generalized) (M62.81);Other abnormalities of gait and mobility (R26.89);Pain Pain - Right/Left: Right Pain - part of body: Shoulder (back)    Time: 4098-1191 PT  Time Calculation (min) (ACUTE ONLY): 21 min   Charges:   PT Evaluation $PT Eval Low Complexity: 1 Low          Aleda Grana, PT, DPT 06/27/22, 2:16 PM   Sandi Mariscal 06/27/2022, 2:14 PM

## 2022-06-27 NOTE — Anesthesia Procedure Notes (Signed)
Procedure Name: Intubation Date/Time: 06/27/2022 8:57 AM  Performed by: Maryla Morrow., CRNAPre-anesthesia Checklist: Patient identified, Patient being monitored, Timeout performed, Emergency Drugs available and Suction available Patient Re-evaluated:Patient Re-evaluated prior to induction Oxygen Delivery Method: Circle system utilized Preoxygenation: Pre-oxygenation with 100% oxygen Induction Type: IV induction Ventilation: Mask ventilation without difficulty Laryngoscope Size: 3 and McGraph Grade View: Grade I Tube type: Oral Tube size: 7.0 mm Number of attempts: 1 Airway Equipment and Method: Stylet Placement Confirmation: ETT inserted through vocal cords under direct vision, positive ETCO2 and breath sounds checked- equal and bilateral Secured at: 19 cm Tube secured with: Tape Dental Injury: Teeth and Oropharynx as per pre-operative assessment

## 2022-06-27 NOTE — Op Note (Signed)
06/27/2022  10:58 AM  Patient:   Tina Pacheco  Pre-Op Diagnosis:   Posterior dislocation status post reverse right total shoulder arthroplasty.  Post-Op Diagnosis:   Same.  Procedure:   Revision of glenosphere and humeral insert status post prior reverse right total shoulder arthroplasty.  Surgeon:   Maryagnes Amos, MD  Assistant:   Waymon Amato, RNFA  Anesthesia:   GET  Findings:   As above.  Complications:   None  EBL:   50 cc  Fluids:   600 cc crystalloid  UOP:   None  TT:   None  Drains:   None  Closure:   Staples  Implants:   A +3 mm constrained humeral insert and a 38 mm concentric +5 mm laterally offset glenosphere.  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 The patient has done well and came in today in preparation for a reverse left total shoulder arthroplasty. In preop this morning, she complained of increased pain in her right shoulder following an event 2 days ago. X-rays in preop showed a posterior dislocation of her shoulder without any apparent fracture. The patient presents at this time for a closed, possibly open reduction and possible revision of glenosphere and humeral platform status post prior reverse right total shoulder arthroplasty. Given the patient's daughter stating that she feels that the shoulder has been giving her problems and "looked funny" for a more extended period of time, most likely we will proceed with an open reduction with revision of the glenosphere and humeral insert.  Procedure:   The patient was brought into the operating room and lain in the supine position. The patient then underwent general endotracheal intubation and anesthesia before the patient was repositioned in the beach chair position using the beach chair positioner. The right shoulder and upper extremity were  prepped with ChloraPrep solution before being draped sterilely. Preoperative antibiotics were administered.   A timeout was performed to verify the appropriate surgical site before a standard anterior approach to the shoulder was made utilizing the previous surgical incision. The incision was carried down through the subcutaneous tissues to expose the deltopectoral fascia. The interval between the deltoid and pectoralis muscles was identified and this plane developed, retracting the cephalic vein laterally with the deltoid muscle. The subdeltoid plane was developed as was the subconjoined tendon plane. While developing this plane, it appeared as though the coracoid process was unusually mobile, raising concern for a fracture.  The anterior soft tissues/subscapularis tendon remnant was released leaving a small amount of soft tissue attached laterally for later reapproximation. A culture was obtained of the joint fluid encountered. In addition, several soft tissue samples were sent for further culture and sensitivity. The glenoid was readily identified and removed using the appropriate device. The baseplate was inspected and found to be intact. The anterior screw however was loose so this was removed. Each of the remaining screws/caps was tested and found to be tight.  A fibrinous rind was identified between the glenosphere and the baseplate.  This was retrieved and sent off to pathology looking for signs of acute inflammation.  The return response from pathology was that there were only 1-2 cells per high-powered field and that there were no organisms identified.  Attention was directed to the humeral side. The insert was removed using the appropriate osteotome.  Again, the humeral  stem appears to be intact and secured to the humeral shaft.    The concentric +5 glenosphere was impacted into place and its locking mechanism verified. Again, the glenoid appeared to have some excess mobility, concerning for a  glenoid neck fracture although the baseplate appeared to be securely fixed. The +3 mm constrained humeral insert then was impacted into place. The shoulder was reduced and the arm placed through range of motion. It was clear that there was excessive motion of the glenosphere construct, raising concern for some sort of scapular/glenoid neck fracture. However, we were not prepared to address this at this time.  The wound was copiously irrigated with sterile saline solution using the jet lavage system before a total of 20 cc of Exparel and 30 cc of 0.5% Sensorcaine with epinephrine was injected into the pericapsular and peri-incisional tissues to help with postoperative analgesia. The subscapularis tendon/anterior capsular tissue lab was reapproximated using #2 FiberWire interrupted sutures. The deltopectoral interval was closed using #0 Vicryl interrupted sutures before the subcutaneous tissues were closed using 2-0 Vicryl interrupted sutures. The skin was closed using staples. A sterile occlusive dressing was applied to the wound before the arm was placed into a shoulder immobilizer with an abduction pillow. A Polar Care system also was applied to the shoulder. The patient was then transferred back to a hospital bed before being awakened, extubated, and returned to the recovery room in satisfactory condition after tolerating the procedure well.

## 2022-06-27 NOTE — H&P (Signed)
History of Present Illness: Tina Pacheco is a 72 y.o. who presents today for a history and physical. She is to undergo a reverse left total shoulder arthroplasty on 06/27/2022. Since her last visit here to clinic there is been no improvement in her condition. Patient expresses her desire to proceed with surgery. Patient is status post right reverse total shoulder arthroplasty performed in 2022.  The patient's symptoms began several months ago and developed without any specific cause or injury. The patient saw Dr. Rosita Kea who sent her for an MRI scan and then referred her to me for further evaluation and treatment. The patient describes the symptoms as moderate (patient is active but has had to make modifications or give up activities) and have the quality of being aching, intermittent, nagging, stabbing, tender, and throbbing. The pain is localized to the lateral arm/shoulder and localized to the anterior shoulder. These symptoms are aggravated with normal daily activities, with sleeping, carrying heavy objects, at higher levels of activity, with overhead activity, and reaching behind the back. She has tried non-steroidal anti-inflammatories (Advil) and narcotics with temporary partial relief. She has tried rest , ice , and heat with limited benefit. She has not tried any steroid injections or physical therapy for these symptoms. The patient denies any neck pain, nor does she note any numbness or paresthesias down her arm to her hand. She is right-hand dominant.   Past Medical History: Anemia  Asthma without status asthmaticus (HHS-HCC)  Atrial fibrillation (CMS/HHS-HCC)  Basal cell carcinoma  Cataract  corrected with surgery; bilateral  Claustrophobia  Collagen vascular disease (CMS/HHS-HCC)  COPD (chronic obstructive pulmonary disease) (CMS/HHS-HCC)  Dyslipidemia  Dysrhythmia  Gout, joint  Heart disease  Hemihypertrophy (HHS-HCC)  Hyperlipidemia  Hyperplastic colonic polyp  Hypertension   Neuropathy  Osteoarthritis  Rheumatoid arthritis(714.0) (CMS/HHS-HCC)  Seasonal allergies  Sleep apnea   Past Surgical History: Foot surgery Right 07/13/2017  Closed reduction of anterior dislocation and steroid injection, right shoulder Right 11/11/2020 (Dr.Hendrixx Severin)  Reverse right total shoulder arthroplasty. Right 11/16/2020 (Dr. Joice Lofts)  basal cell carcinoma  CESAREAN SECTION  CHOLECYSTECTOMY  COLONOSCOPY  EYE SURGERY (cataract extraction)  HEART CATHERIZATION  HYSTERECTOMY  KNEE ARTHROSCOPY  TUBAL LIGATION   Past Family History: Diabetes type II Mother  High blood pressure (Hypertension) Mother  Tuberculosis Mother  Parkinsonism Mother  Emphysema Father  smoked  Diabetes type II Sister  Breast cancer Sister  Tuberculosis Sister  Breast cancer Maternal Aunt   Medications: albuterol (PROVENTIL) 2.5 mg /3 mL (0.083 %) nebulizer solution Take 2.5 mg by nebulization once as needed  AMIOdarone (PACERONE) 200 MG tablet Take 1 tablet (200 mg total) by mouth once daily  amoxicillin (AMOXIL) 500 MG capsule Take 500 mg by mouth once Prior to dental procedures  apixaban (ELIQUIS) 5 mg tablet Take 1 tablet (5 mg total) by mouth 2 (two) times daily  atorvastatin (LIPITOR) 40 MG tablet Take 1 tablet (40 mg total) by mouth once daily  bisacodyL (DULCOLAX) 10 mg suppository Place 1 suppository (10 mg total) rectally once daily as needed  fluoride, sodium, 1.1 % Place onto teeth  metOLazone (ZAROXOLYN) 2.5 MG tablet Take 1 tablet (2.5 mg total) by mouth as needed  metoprolol succinate (TOPROL-XL) 50 MG XL tablet Take 1 tablet (50 mg total) by mouth once daily  montelukast (SINGULAIR) 10 mg tablet Take 1 tablet (10 mg total) by mouth once daily  nitroGLYcerin (NITROSTAT) 0.4 MG SL tablet Place 1 tablet (0.4 mg total) under the tongue every 5 (five)  minutes as needed  NYSTATIN TOPICAL Apply topically 3 (three) times daily I billion unit, prn  omeprazole (PRILOSEC) 40 MG DR capsule Take 40  mg by mouth once daily  ondansetron (ZOFRAN-ODT) 4 MG disintegrating tablet Take 4 mg by mouth every 8 (eight) hours as needed  oxyCODONE (OXYIR) 20 mg immediate release tablet Take 20 mg by mouth 2 (two) times daily as needed  polyethylene glycol (MIRALAX) packet Take 1 packet (17 g total) by mouth once daily Mix in 4-8ounces of fluid prior to taking.  potassium chloride (K-TAB) 20 mEq TbER ER tablet Take 1 tablet (20 mEq total) by mouth once daily  predniSONE (DELTASONE) 5 MG tablet Take 5 mg by mouth once daily  sodium phosphate,mono-dibasic (ENEMA RECTAL) Place rectally Saline Enema x 1 dose/24 hours as needed  spironolactone (ALDACTONE) 25 MG tablet Take 1 tablet (25 mg total) by mouth once daily  terazosin (HYTRIN) 10 MG capsule Take 1 capsule (10 mg total) by mouth once daily  TORsemide (DEMADEX) 20 MG tablet Take 2 tablets (40 mg total) by mouth once daily  ketoconazole (NIZORAL) 2 % cream ketoconazole 2 % topical cream APPLY A SMALL AMOUNT TO AFFECTED AREA ONCE A DAY TO RASH ON BUTTOCKS (Patient not taking: Reported on 06/20/2022)  ondansetron (ZOFRAN) 4 MG tablet Take by mouth  oxyCODONE (DAZIDOX) 20 mg immediate release tablet Take 1 tablet (20 mg total) by mouth in the morning and at bedtime (Patient not taking: Reported on 06/20/2022)   Allergies: Fluoxetine Other (Headache, shaking, sleep issues)  Promethazine Other (Sleepiness)  Codeine Other (Nausea and vomiting/only when taking too much)   Review of Systems A comprehensive 14 point ROS was performed, reviewed, and the pertinent orthopaedic findings are documented in the HPI.  Physical Exam: BP 130/70 (BP Location: Left upper arm, Patient Position: Sitting, BP Cuff Size: Large Adult)  Ht 157.5 cm (5\' 2" )  Wt (!) 115.2 kg (254 lb)  BMI 46.46 kg/m   General: Well-developed well-nourished female seen in no acute distress.  She appears to be alert and oriented x 3.  HEENT: Atraumatic,normocephalic. Pupils are equal and  reactive to light. Oropharynx is clear with moist mucosa  Lungs: Clear to auscultation bilaterally   Cardiovascular: Irregular rate and rhythm. Normal S1, S2. No murmurs. No appreciable gallops or rubs. Peripheral pulses are palpable.  Abdomen: Soft, non-tender, nondistended. Bowel sounds present  Extremity: Left shoulder exam: SKIN: normal SWELLING: none WARMTH: none LYMPH NODES: no adenopathy palpable CREPITUS: Mild glenohumeral crepitance TENDERNESS: Moderate tenderness over anterolateral shoulder ROM (active):  Forward flexion: 95 degrees Abduction: 85 degrees Internal rotation: Left buttock ROM (passive):  Forward flexion: 150 degrees Abduction: 145 degrees  ER/IR at 90 abd: 90 degrees / 50 degrees  She experiences mild-moderate pain at the extremes of all motions.  STRENGTH: Forward flexion: 4/5 Abduction: 3/5 External rotation: 4/5 Internal rotation: 4-4+/5 Pain with RC testing: Moderate pain with resisted abduction more so than with resisted forward flexion and external rotation  STABILITY: Normal  SPECIAL TESTS: Juanetta Gosling' test: positive, mild Speed's test: Not evaluated Capsulitis - pain w/ passive ER: no Crossed arm test: Mildly positive Crank: Not evaluated Anterior apprehension: Negative Posterior apprehension: Not evaluated  Grossly, she is neurovascularly intact to the left upper extremity and hand.  Right shoulder exam: Skin inspection of the right shoulder demonstrates her surgical incision to be well-healed without evidence for infection.  No swelling, erythema, ecchymosis, abrasions, or other skin abnormalities are identified.  There does appear to be some  mild deformity of the shoulder.  She has moderate tenderness palpation along the posterior scapular spine, and mild tenderness laterally more so than anteriorly.  Actively, she is able to forward flex to 30 degrees and abduct to 15 degrees.  Passively, she can tolerate forward flexion only to about  45 degrees and abduction to 30 degrees.  She has pain with any attempted active or passive motion of the shoulder.  She exhibits 3+/5 strength with attempted resisted internal and external rotation, as well as with attempted resisted abduction with her arm at her side.  Grossly, she is neurovascularly intact to the right upper extremity and hand.  Imaging: Left Shoulder Imaging, MRI: MRI Shoulder Cartilage: Full thickness humeral head cartilage loss. Full thickness glenoid cartilage loss. MRI Shoulder Rotator Cuff: Full thickness tear of the supraspinatus. Retracted to the glenohumeral joint. Also substantial partial thickness tearing of the supraspinatus and subscapularis tendons. MRI Shoulder Labrum / Biceps: Chronically torn and retracted long head of biceps tendon MRI Shoulder Bone: Normal bone.  Right shoulder x-rays: Portable x-rays of the right shoulder taken today in preop demonstrate a posteriorly dislocated reverse right total shoulder arthroplasty.  No obvious fractures or loosening of the components are identified.  Impression: 1. Posterior dislocation of reverse right total shoulder arthroplasty. 2. Nontraumatic complete tear of left rotator cuff 3. Rotator cuff arthropathy left shoulder  Plan:  The treatment options have been discussed with the patient and her daughter, who is at the bedside.  Given the findings on the right shoulder x-rays taken this morning in preop, I feel that it would be in the patient's best interest to cancel the left shoulder surgery and instead focus on her right shoulder.  The plan for a possible closed versus possible open reduction and revision of the glenosphere and liner of her reverse right total shoulder arthroplasty have been discussed in detail, as have the potential risks (including bleeding, infection, nerve and/or blood vessel injury, persistent or recurrent pain, loosening of and/or failure of the components, dislocation, need for further  surgery, blood clots, strokes, heart attacks and/or arrhythmias, etc.) and benefits.  The patient and her daughter state their understanding and wish to proceed.  A formal written consent has been obtained by the nursing staff.   Addendum: H&P reviewed and patient re-examined.    The patient presents this morning complaining of worse pain in her right shoulder.  She states that 2 days ago, while throwing a spoon up onto the counter, she felt a "pop" in her right shoulder.  Since then, she has had increased pain in her shoulder with the inability to raise her arm even a little.  However, according to her daughter, the patient has been having difficulty using this arm for quite some time.  Furthermore, the daughter feels that the shoulder "looks funny", and has looked funny for quite some time.  Apparently the patient uses her arms quite frequently to push up from a chair and when using her walker.  Although the patient denies any prior prior injury to the right shoulder since her reverse right TSA surgery, she has not been having difficulty using it and admits that she never followed through with the therapy as recommended.  Based on the patient's history, examination findings, and x-rays this morning, clearly, the patient has a dislocated reverse right total shoulder arthroplasty that will require immediate attention.  Therefore, we will cancel the planned reverse left total shoulder arthroplasty and focus on her right shoulder.  The plan  is as described above.  This plan has been discussed with the patient and her daughter who are in agreement.

## 2022-06-27 NOTE — Progress Notes (Signed)
Pt arrived via wheelchair to [facility] from home, pt upon arrival c/o severe pain in the right shoulder. Pt stated that she felt a pop a few days ago. Dr Joice Lofts notified of pt's pain.

## 2022-06-28 ENCOUNTER — Encounter: Payer: Self-pay | Admitting: Surgery

## 2022-06-28 DIAGNOSIS — Z96611 Presence of right artificial shoulder joint: Secondary | ICD-10-CM | POA: Diagnosis not present

## 2022-06-28 DIAGNOSIS — I130 Hypertensive heart and chronic kidney disease with heart failure and stage 1 through stage 4 chronic kidney disease, or unspecified chronic kidney disease: Secondary | ICD-10-CM | POA: Diagnosis not present

## 2022-06-28 DIAGNOSIS — I2510 Atherosclerotic heart disease of native coronary artery without angina pectoris: Secondary | ICD-10-CM | POA: Diagnosis not present

## 2022-06-28 DIAGNOSIS — N183 Chronic kidney disease, stage 3 unspecified: Secondary | ICD-10-CM | POA: Diagnosis not present

## 2022-06-28 DIAGNOSIS — T84028A Dislocation of other internal joint prosthesis, initial encounter: Secondary | ICD-10-CM | POA: Diagnosis not present

## 2022-06-28 DIAGNOSIS — I5032 Chronic diastolic (congestive) heart failure: Secondary | ICD-10-CM | POA: Diagnosis not present

## 2022-06-28 LAB — CBC
HCT: 36.9 % (ref 36.0–46.0)
Hemoglobin: 11.7 g/dL — ABNORMAL LOW (ref 12.0–15.0)
MCH: 29 pg (ref 26.0–34.0)
MCHC: 31.7 g/dL (ref 30.0–36.0)
MCV: 91.6 fL (ref 80.0–100.0)
Platelets: 207 10*3/uL (ref 150–400)
RBC: 4.03 MIL/uL (ref 3.87–5.11)
RDW: 13.6 % (ref 11.5–15.5)
WBC: 10.1 10*3/uL (ref 4.0–10.5)
nRBC: 0 % (ref 0.0–0.2)

## 2022-06-28 LAB — BASIC METABOLIC PANEL
Anion gap: 11 (ref 5–15)
BUN: 40 mg/dL — ABNORMAL HIGH (ref 8–23)
CO2: 31 mmol/L (ref 22–32)
Calcium: 9.6 mg/dL (ref 8.9–10.3)
Chloride: 95 mmol/L — ABNORMAL LOW (ref 98–111)
Creatinine, Ser: 1.46 mg/dL — ABNORMAL HIGH (ref 0.44–1.00)
GFR, Estimated: 38 mL/min — ABNORMAL LOW (ref >60)
Glucose, Bld: 137 mg/dL — ABNORMAL HIGH (ref 70–99)
Potassium: 3.5 mmol/L (ref 3.5–5.1)
Sodium: 137 mmol/L (ref 135–145)

## 2022-06-28 LAB — SURGICAL PATHOLOGY

## 2022-06-28 LAB — AEROBIC/ANAEROBIC CULTURE W GRAM STAIN (SURGICAL/DEEP WOUND): Gram Stain: NONE SEEN

## 2022-06-28 MED ORDER — PANTOPRAZOLE SODIUM 40 MG PO TBEC
DELAYED_RELEASE_TABLET | ORAL | Status: AC
  Filled 2022-06-28: qty 1

## 2022-06-28 MED ORDER — OXYCODONE HCL 5 MG PO TABS
ORAL_TABLET | ORAL | Status: AC
  Filled 2022-06-28: qty 2

## 2022-06-28 MED ORDER — CEFAZOLIN SODIUM-DEXTROSE 2-4 GM/100ML-% IV SOLN
INTRAVENOUS | Status: AC
  Filled 2022-06-28: qty 100

## 2022-06-28 MED ORDER — OXYCODONE HCL 5 MG PO TABS: 5.0000 mg | ORAL_TABLET | ORAL | 0 refills | Status: DC | PRN

## 2022-06-28 MED ORDER — ACETAMINOPHEN 500 MG PO TABS
ORAL_TABLET | ORAL | Status: AC
  Filled 2022-06-28: qty 2

## 2022-06-28 MED ORDER — ATORVASTATIN CALCIUM 20 MG PO TABS
ORAL_TABLET | ORAL | Status: AC
  Filled 2022-06-28: qty 1

## 2022-06-28 MED ORDER — POTASSIUM CHLORIDE CRYS ER 20 MEQ PO TBCR
EXTENDED_RELEASE_TABLET | ORAL | Status: AC
  Filled 2022-06-28: qty 1

## 2022-06-28 MED ORDER — DOCUSATE SODIUM 100 MG PO CAPS
ORAL_CAPSULE | ORAL | Status: AC
  Filled 2022-06-28: qty 1

## 2022-06-28 MED ORDER — APIXABAN 2.5 MG PO TABS
ORAL_TABLET | ORAL | Status: AC
  Filled 2022-06-28: qty 2

## 2022-06-28 NOTE — Discharge Summary (Signed)
Physician Discharge Summary  Subjective: 1 Day Post-Op Procedure(s) (LRB): TOTAL SHOULDER REVISION (Right) Patient reports pain as mild.   Patient seen in rounds with Dr. Joice Lofts. Patient is well, and has had no acute complaints or problems Patient is ready to go home with outpatient physical therapy  Physician Discharge Summary  Patient ID: Tina Pacheco MRN: [id number] DOB/AGE: [birth year] 72 y.o.  Admit date: 06/27/2022 Discharge date: 06/28/2022  Admission Diagnoses:  Discharge Diagnoses:  Principal Problem:   Status post reverse arthroplasty of shoulder, right   Discharged Condition: fair  Hospital Course: The patient is postop day 1 from a right reverse arthroplasty revision.  She is doing well since surgery.  She is in the shoulder immobilizer.  She has gotten up to the bathroom with mild assistance.  She will do physical therapy this morning and then will go home after physical therapy completion.  Treatments: surgery:   Revision of glenosphere and humeral insert status post prior reverse right total shoulder arthroplasty.   Surgeon:   Maryagnes Amos, MD   Assistant:   Waymon Amato, RNFA   Anesthesia:   GET   Findings:   As above.   Complications:   None   EBL:   50 cc   Fluids:   600 cc crystalloid   UOP:   None   TT:   None   Drains:   None   Closure:   Staples   Implants:   A +3 mm constrained humeral insert and a 38 mm concentric +5 mm laterally offset glenosphere.    Discharge Exam: Blood pressure (!) 157/56, pulse (!) 53, temperature (!) 97 F (36.1 C), temperature source Temporal, resp. rate 16, height 5\' 3"  (1.6 m), weight 115.2 kg, SpO2 93 %.   Disposition: Discharge disposition: 01-Home or Self Care        Allergies as of 06/28/2022       Reactions   Fluoxetine Other (See Comments)   Headache, shaking, sleep issues Headache, shaking, sleep issues   Codeine    Nausea and vomiting/only when taking too much         Medication List     TAKE these medications    amiodarone 200 MG tablet Commonly known as: PACERONE TAKE 1 TABLET(200 MG) BY MOUTH DAILY What changed: See the new instructions.   amoxicillin 500 MG capsule Commonly known as: AMOXIL Take 2,000 mg by mouth once. Prior to dental procedures   apixaban 5 MG Tabs tablet Commonly known as: Eliquis Take 1 tablet (5 mg total) by mouth 2 (two) times daily.   atorvastatin 40 MG tablet Commonly known as: LIPITOR TAKE 1 TABLET(40 MG) BY MOUTH DAILY   CIMZIA  Inject into the skin. Patient states taking 1 x per month   docusate sodium 100 MG capsule Commonly known as: Colace Take 1 capsule (100 mg total) by mouth 2 (two) times daily.   metolazone 2.5 MG tablet Commonly known as: ZAROXOLYN Take 1 tablet (2.5 mg total) by mouth daily as needed. If home weight is over 280#   metoprolol succinate 50 MG 24 hr tablet Commonly known as: TOPROL-XL TAKE 1 TABLET BY MOUTH EVERY DAY WITH OR IMMEDIATELY FOLLOWING A MEAL What changed: See the new instructions.   montelukast 10 MG tablet Commonly known as: SINGULAIR TAKE 1 TABLET BY MOUTH AT BEDTIME   nitroGLYCERIN 0.4 MG SL tablet Commonly known as: NITROSTAT Place 1 tablet (0.4 mg total) under the tongue every 5 (five) minutes as needed for  chest pain.   nystatin powder Commonly known as: MYCOSTATIN/NYSTOP Apply 1 application topically 3 (three) times daily as needed.   omeprazole 40 MG capsule Commonly known as: PRILOSEC Take 1 capsule (40 mg total) by mouth at bedtime.   ondansetron 4 MG disintegrating tablet Commonly known as: ZOFRAN-ODT DISSOLVE 1 TABLET(4 MG) ON THE TONGUE EVERY 8 HOURS AS NEEDED FOR NAUSEA OR VOMITING   Oxycodone HCl 20 MG Tabs Take 1 tablet (20 mg total) by mouth 2 (two) times daily as needed. What changed: Another medication with the same name was added. Make sure you understand how and when to take each.   oxyCODONE 5 MG immediate release  tablet Commonly known as: Oxy IR/ROXICODONE Take 1-2 tablets (5-10 mg total) by mouth every 4 (four) hours as needed for moderate pain (pain score 4-6). What changed: You were already taking a medication with the same name, and this prescription was added. Make sure you understand how and when to take each.   polyethylene glycol 17 g packet Commonly known as: MIRALAX / GLYCOLAX Take 17 g by mouth daily as needed for mild constipation.   Potassium Chloride 40 MEQ/15ML (20%) Soln Take 7.5 mLs by mouth daily.   predniSONE 5 MG tablet Commonly known as: DELTASONE Take 1 tablet (5 mg total) by mouth daily with breakfast. What changed: when to take this   promethazine 12.5 MG tablet Commonly known as: PHENERGAN Take 1 tablet (12.5 mg total) by mouth every 6 (six) hours as needed for nausea or vomiting.   spironolactone 25 MG tablet Commonly known as: ALDACTONE TAKE 1 TABLET(25 MG) BY MOUTH DAILY   terazosin 10 MG capsule Commonly known as: HYTRIN TAKE 1 CAPSULE(10 MG) BY MOUTH AT BEDTIME   torsemide 20 MG tablet Commonly known as: DEMADEX Take 1 tablet (20 mg total) by mouth daily. What changed: how much to take        Follow-up Information     Anson [place], PA-C Follow up in 2 week(s).   Specialty: Physician Assistant Why: For staple removal and x-rays of the right shoulder Contact information: [address] [place] [address] [phone]                 Signed: Lenard Forth, Adonus Uselman 06/28/2022, 7:25 AM   Objective: Vital signs in last 24 hours: Temp:  [97 F (36.1 C)-97.9 F (36.6 C)] 97 F (36.1 C) (05/01 0432) Pulse Rate:  [53-64] 53 (05/01 0432) Resp:  [8-19] 16 (05/01 0432) BP: (128-168)/(52-78) 157/56 (05/01 0432) SpO2:  [92 %-98 %] 93 % (05/01 0432)  Intake/Output from previous day:  Intake/Output Summary (Last 24 hours) at 06/28/2022 0725 Last data filed at 06/27/2022 1600 Gross per 24 hour  Intake 1453.75 ml  Output 0 ml  Net  1453.75 ml    Intake/Output this shift: No intake/output data recorded.  Labs: Recent Labs    06/28/22 0634  HGB 11.7*   Recent Labs    06/28/22 0634  WBC 10.1  RBC 4.03  HCT 36.9  PLT 207   Recent Labs    06/28/22 0634  NA 137  K 3.5  CL 95*  CO2 31  BUN 40*  CREATININE 1.46*  GLUCOSE 137*  CALCIUM 9.6   No results for input(s): "LABPT", "INR" in the last 72 hours.  EXAM: General - Patient is Alert and Oriented Extremity - Neurovascular intact Sensation intact distally Compartment soft Incision - clean, dry, no drainage with the shoulder immobilizer in place Motor Function -  grip strength intact.  Dorsiflexion and volar flexion of the fingers intact.  Assessment/Plan: 1 Day Post-Op Procedure(s) (LRB): TOTAL SHOULDER REVISION (Right) Procedure(s) (LRB): TOTAL SHOULDER REVISION (Right) Past Medical History:  Diagnosis Date   (HFpEF) heart failure with preserved ejection fraction (HCC)    a.) TTE 08/10/2014: EF 55-60%, mild MAC, mild LAE, mild MR, PASP 38; b.) TTE 03/31/2019: EF 55-60%, mild LVH, mild-mod LAW, mod MAC, mild-mod AoV sclerosis, triv PR, G1DD; c.) TTE 05/29/2020: EF 60-65%, mild LVH, mod LAE, G1DD   Anemia    Aortic atherosclerosis (HCC)    Asthma    Basal cell carcinoma    CAD (coronary artery disease)    a.) LHC 05/24/2010 - nonobstructive CAD - med mgmt; b.) LHC 06/04/2020: 30% dLAD, 40% o-mLAD - med mgmt   Chronic, continuous use of opioids    Claustrophobia    COPD (chronic obstructive pulmonary disease) (HCC)    DDD (degenerative disc disease), lumbosacral    Diuretic-induced hypokalemia    Dyslipidemia    Fibromyalgia    Frequent falls    GERD (gastroesophageal reflux disease)    Gouty arthropathy    Hemihypertrophy    History of bilateral cataract extraction 2018   Hyperplastic colonic polyp 2003   Hypertension    Long term current use of amiodarone    Long term current use of anticoagulant    a.) apixaban   Long term current  use of immunosuppressive drug    a.) certolizumab pegol  + predisone for RA diagnosis   MDD (major depressive disorder)    Migraines    Morbid obesity (HCC)    Nontraumatic complete tear of rotator cuff, left    OSA (obstructive sleep apnea)    a.) non-complaint with prescribed nocturnal PAP therapy   Osteoarthritis    PAF (paroxysmal atrial fibrillation) (HCC)    a.) CHA2DS2-VASc = 5 (age, sex, CHF, HTN, vascular disease history); b.) rate and rhythm maintained on oral amiodarone + metoprolol succinate; chronically anticoagulated with standard dose apixaban   Pneumonia    Prediabetes    Rheumatoid arthritis (HCC)    a.) on certolizumab pegol + predisone   Stage 3 chronic kidney disease (HCC)    Principal Problem:   Status post reverse arthroplasty of shoulder, right  Estimated body mass index is 44.99 kg/m as calculated from the following:   Height as of this encounter: 5\' 3"  (1.6 m).   Weight as of this encounter: 115.2 kg. Advance diet Up with therapy D/C IV fluids Diet - Regular diet Follow up - in 2 weeks Activity -shoulder immobilizer on at all times except for bathing and physical therapy Disposition - Home Condition Upon Discharge - Stable DVT Prophylaxis -  Eliquis  Dedra Skeens, PA-C Orthopaedic Surgery 06/28/2022, 7:25 AM

## 2022-06-28 NOTE — Progress Notes (Signed)
DISCHARGE NOTE:  Pt and daughter Trula Ore given discharge instructions and scripts. Both verbalized understanding. Shoulder immobilizer on, polar care, incentive spirometer and wheelchair sent with pt. Pt wheeled to car by staff. Daughter providing transportation.

## 2022-06-28 NOTE — Anesthesia Postprocedure Evaluation (Signed)
Anesthesia Post Note  Patient: Tina Pacheco  Procedure(s) Performed: TOTAL SHOULDER REVISION (Right: Shoulder)  Patient location during evaluation: PACU Anesthesia Type: General Level of consciousness: awake and alert Pain management: pain level controlled Vital Signs Assessment: post-procedure vital signs reviewed and stable Respiratory status: spontaneous breathing, nonlabored ventilation, respiratory function stable and patient connected to nasal cannula oxygen Cardiovascular status: blood pressure returned to baseline and stable Postop Assessment: no apparent nausea or vomiting Anesthetic complications: no   There were no known notable events for this encounter.   Last Vitals:  Vitals:   06/28/22 0731 06/28/22 1103  BP: (!) 166/63 (!) 152/56  Pulse: (!) 51 (!) 58  Resp: 17 18  Temp: 36.6 C   SpO2: 93% 93%    Last Pain:  Vitals:   06/28/22 1020  TempSrc:   PainSc: 3                  Louie Boston

## 2022-06-28 NOTE — Progress Notes (Signed)
Physical Therapy Treatment Patient Details Name: Tina Pacheco MRN: [id number] DOB: Jul 01, 1950 Today's Date: 06/28/2022   History of Present Illness Pt is a 72 y/o F admitted on 06/27/22 for scheduled L shoulder surgery but upon arrival reports she felt a pop in her R shoulder resulting in ongoing pain a couple days prior. R shoulder x-ray revealed posterior dislocation of reverse R TSA. L shoulder sx was canceled & pt instead underwent revision of glenosphere & humeral insert s/p prior reverse R TSA. PMH: asthma, a-fib, claustrophobia, collagen vascular disease, COPD, dyslipidemia, gout, hemihypertrophy, HLD, HTN, neuropathy, OA, RA, sleep apnea    PT Comments    Pt received upright in bed agreeable to PT. Pt has R shoulder sling donned appropriately with polar care. Pt exits R side of bed mod-I maintaining NWB precautions standing to hemi walker at supervision level ambulating to bathroom at minguard. Pt relies on mod VC's for correct hemi walker sequencing with her RLE for reduced falls risk. Pt requires x2 standing rest breaks due to baseline limited mobility and onset of lower back pain. Pt performs seated and standing pericare at supervision level transferring to w/c to return to room. Daughter present  upon return to room. PT educated pt and daughter on don/doff sling, UB/LB dressing, sleeping posture, and elbow/wrist AROM exercises to prevent elbow contracture. Daughter familiar with sling and dressing as pt has had previous sling and shoulder precautions prior to recent admission (see above for history of present illness). Reviewed with pt and daughter on safe mobility and home management (I.e. using w/c PRN and BSC due to frequent urination due to baseline diuretics) to reduce falls risk. Pt safe to d/c following surgeon's recommendations for f/u care.    Recommendations for follow up therapy are one component of a multi-disciplinary discharge planning process, led by the attending physician.   Recommendations may be updated based on patient status, additional functional criteria and insurance authorization.     Assistance Recommended at Discharge Intermittent Supervision/Assistance  Patient can return home with the following A little help with walking and/or transfers;Assistance with cooking/housework;A little help with bathing/dressing/bathroom;Help with stairs or ramp for entrance;Assist for transportation   Equipment Recommendations  None recommended by PT    Recommendations for Other Services       Precautions / Restrictions Precautions Precautions: Fall;Shoulder Shoulder Interventions: Shoulder sling/immobilizer;At all times Precaution Booklet Issued: Yes (comment) Restrictions Weight Bearing Restrictions: Yes RUE Weight Bearing: Non weight bearing     Mobility  Bed Mobility Overal bed mobility: Modified Independent Bed Mobility: Supine to Sit     Supine to sit: HOB elevated     General bed mobility comments: R side of bed Patient Response: Cooperative  Transfers Overall transfer level: Needs assistance Equipment used: Hemi-walker Transfers: Sit to/from Stand Sit to Stand: Supervision           General transfer comment: safe hand placement    Ambulation/Gait Ambulation/Gait assistance: Min guard Gait Distance (Feet): 25 Feet Assistive device: Hemi-walker Gait Pattern/deviations: Decreased step length - right, Decreased step length - left, Decreased stride length, Step-to pattern       General Gait Details: VC's for sequencing hemi walker with correct LE's. Fair carryover.   Stairs             Wheelchair Mobility    Modified Rankin (Stroke Patients Only)       Balance Overall balance assessment: Needs assistance Sitting-balance support: Feet supported Sitting balance-Leahy Scale: Good Sitting balance - Comments: Pt performs  peri hygiene on toilet without LOB   Standing balance support: Single extremity supported, No upper  extremity supported Standing balance-Leahy Scale: Fair Standing balance comment: further pericare performed in standing                            Cognition Arousal/Alertness: Awake/alert Behavior During Therapy: WFL for tasks assessed/performed Overall Cognitive Status: Within Functional Limits for tasks assessed                                          Exercises General Exercises - Upper Extremity Elbow Flexion: AROM, Seated, Right, 10 reps Elbow Extension: AROM, Right, 10 reps, Seated Wrist Flexion: AROM, Seated, Right, 10 reps Wrist Extension: AROM, Seated, Right, 10 reps Other Exercises Other Exercises: Reviewed with daughter UB and LB dressing, donning/doffing sling, elbow and wrist exercises, NWB precautions, sleeping posture    General Comments        Pertinent Vitals/Pain Pain Assessment Pain Assessment: Faces Faces Pain Scale: Hurts little more Pain Location: back>R shoulder Pain Descriptors / Indicators: Discomfort Pain Intervention(s): Monitored during session, Repositioned    Home Living                          Prior Function            PT Goals (current goals can now be found in the care plan section) Acute Rehab PT Goals Patient Stated Goal: go home PT Goal Formulation: With patient Potential to Achieve Goals: Good Progress towards PT goals: Progressing toward goals    Frequency    Min 2X/week      PT Plan Current plan remains appropriate    Co-evaluation              AM-PAC PT "6 Clicks" Mobility   Outcome Measure  Help needed turning from your back to your side while in a flat bed without using bedrails?: None Help needed moving from lying on your back to sitting on the side of a flat bed without using bedrails?: A Little Help needed moving to and from a bed to a chair (including a wheelchair)?: A Little Help needed standing up from a chair using your arms (e.g., wheelchair or bedside  chair)?: A Little Help needed to walk in hospital room?: A Little Help needed climbing 3-5 steps with a railing? : A Lot 6 Click Score: 18    End of Session Equipment Utilized During Treatment: Gait belt Activity Tolerance: Patient tolerated treatment well Patient left: in chair;with call bell/phone within reach;with family/visitor present Nurse Communication: Mobility status PT Visit Diagnosis: Unsteadiness on feet (R26.81);Muscle weakness (generalized) (M62.81);Other abnormalities of gait and mobility (R26.89);Pain Pain - Right/Left: Right Pain - part of body: Shoulder (lower back)     Time: 1610-9604 PT Time Calculation (min) (ACUTE ONLY): 38 min  Charges:  $Gait Training: 8-22 mins $Therapeutic Exercise: 8-22 mins $Self Care/Home Management: 8-22                     Delphia Grates. Fairly IV, PT, DPT Physical Therapist- [place]  [facility]  06/28/2022, 10:09 AM

## 2022-06-28 NOTE — Progress Notes (Signed)
Subjective: 1 Day Post-Op Procedure(s) (LRB): TOTAL SHOULDER REVISION (Right) Patient reports pain as mild.   Patient is well, and has had no acute complaints or problems Plan is to go Home after hospital stay. Negative for chest pain and shortness of breath Fever: no Gastrointestinal: Negative for nausea and vomiting  Objective: Vital signs in last 24 hours: Temp:  [97 F (36.1 C)-97.9 F (36.6 C)] 97 F (36.1 C) (05/01 0432) Pulse Rate:  [53-64] 53 (05/01 0432) Resp:  [8-19] 16 (05/01 0432) BP: (128-168)/(52-78) 157/56 (05/01 0432) SpO2:  [92 %-98 %] 93 % (05/01 0432)  Intake/Output from previous day:  Intake/Output Summary (Last 24 hours) at 06/28/2022 0719 Last data filed at 06/27/2022 1600 Gross per 24 hour  Intake 1453.75 ml  Output 0 ml  Net 1453.75 ml    Intake/Output this shift: No intake/output data recorded.  Labs: Recent Labs    06/28/22 0634  HGB 11.7*   Recent Labs    06/28/22 0634  WBC 10.1  RBC 4.03  HCT 36.9  PLT 207   Recent Labs    06/28/22 0634  NA 137  K 3.5  CL 95*  CO2 31  BUN 40*  CREATININE 1.46*  GLUCOSE 137*  CALCIUM 9.6   No results for input(s): "LABPT", "INR" in the last 72 hours.   EXAM General - Patient is Alert and Oriented Extremity - Neurovascular intact Sensation intact distally Dorsiflexion/Plantar flexion intact Compartment soft Dressing/Incision - clean, dry, no drainage Motor Function - intact, moving fingers and wrist well on exam.   Past Medical History:  Diagnosis Date   (HFpEF) heart failure with preserved ejection fraction (HCC)    a.) TTE 08/10/2014: EF 55-60%, mild MAC, mild LAE, mild MR, PASP 38; b.) TTE 03/31/2019: EF 55-60%, mild LVH, mild-mod LAW, mod MAC, mild-mod AoV sclerosis, triv PR, G1DD; c.) TTE 05/29/2020: EF 60-65%, mild LVH, mod LAE, G1DD   Anemia    Aortic atherosclerosis (HCC)    Asthma    Basal cell carcinoma    CAD (coronary artery disease)    a.) LHC 05/24/2010 -  nonobstructive CAD - med mgmt; b.) LHC 06/04/2020: 30% dLAD, 40% o-mLAD - med mgmt   Chronic, continuous use of opioids    Claustrophobia    COPD (chronic obstructive pulmonary disease) (HCC)    DDD (degenerative disc disease), lumbosacral    Diuretic-induced hypokalemia    Dyslipidemia    Fibromyalgia    Frequent falls    GERD (gastroesophageal reflux disease)    Gouty arthropathy    Hemihypertrophy    History of bilateral cataract extraction 2018   Hyperplastic colonic polyp 2003   Hypertension    Long term current use of amiodarone    Long term current use of anticoagulant    a.) apixaban   Long term current use of immunosuppressive drug    a.) certolizumab pegol  + predisone for RA diagnosis   MDD (major depressive disorder)    Migraines    Morbid obesity (HCC)    Nontraumatic complete tear of rotator cuff, left    OSA (obstructive sleep apnea)    a.) non-complaint with prescribed nocturnal PAP therapy   Osteoarthritis    PAF (paroxysmal atrial fibrillation) (HCC)    a.) CHA2DS2-VASc = 5 (age, sex, CHF, HTN, vascular disease history); b.) rate and rhythm maintained on oral amiodarone + metoprolol succinate; chronically anticoagulated with standard dose apixaban   Pneumonia    Prediabetes    Rheumatoid arthritis (HCC)  a.) on certolizumab pegol + predisone   Stage 3 chronic kidney disease (HCC)     Assessment/Plan: 1 Day Post-Op Procedure(s) (LRB): TOTAL SHOULDER REVISION (Right) Principal Problem:   Status post reverse arthroplasty of shoulder, right  Estimated body mass index is 44.99 kg/m as calculated from the following:   Height as of this encounter: 5\' 3"  (1.6 m).   Weight as of this encounter: 115.2 kg. Advance diet Up with therapy D/C IV fluids  Discharge planning: Discharge home after completing physical therapy goals today.  Patient will follow-up in [place] clinic orthopedics in 2 weeks for staple removal.  Outpatient physical therapy.  DVT  Prophylaxis -  Eliquis Shoulder immobilizer to right upper extremity at all times except for bathing and physical therapy  Dedra Skeens, PA-C Orthopaedic Surgery 06/28/2022, 7:19 AM

## 2022-06-28 NOTE — Discharge Instructions (Signed)
INSTRUCTIONS AFTER Surgery  Remove items at home which could result in a fall. This includes throw rugs or furniture in walking pathways ICE to the affected joint every three hours while awake for 30 minutes at a time, for at least the first 3-5 days, and then as needed for pain and swelling.  Continue to use ice for pain and swelling. You may notice swelling that will progress down to the foot and ankle.  This is normal after surgery.  Elevate your leg when you are not up walking on it.   Continue to use the breathing machine you got in the hospital (incentive spirometer) which will help keep your temperature down.  It is common for your temperature to cycle up and down following surgery, especially at night when you are not up moving around and exerting yourself.  The breathing machine keeps your lungs expanded and your temperature down.   DIET:  As you were doing prior to hospitalization, we recommend a well-balanced diet.  DRESSING / WOUND CARE / SHOWERING  Dressing change as needed.  Staples will be removed in 2 weeks at [facility] orthopedics.  No showering.  ACTIVITY  Increase activity slowly as tolerated, but follow the weight bearing instructions below.   No driving for 6 weeks or until further direction given by your physician.  You cannot drive while taking narcotics.  No lifting or carrying greater than 10 lbs. until further directed by your surgeon. Avoid periods of inactivity such as sitting longer than an hour when not asleep. This helps prevent blood clots.  You may return to work once you are authorized by your doctor.     WEIGHT BEARING  Weightbearing as tolerated on the right arm.  Continue the shoulder immobilizer at all times, except for bathing and physical therapy.   EXERCISES Pendulum range of motion exercises with physical therapy.  CONSTIPATION  Constipation is defined medically as fewer than three stools per week and severe constipation as less than one  stool per week.  Even if you have a regular bowel pattern at home, your normal regimen is likely to be disrupted due to multiple reasons following surgery.  Combination of anesthesia, postoperative narcotics, change in appetite and fluid intake all can affect your bowels.   YOU MUST use at least one of the following options; they are listed in order of increasing strength to get the job done.  They are all available over the counter, and you may need to use some, POSSIBLY even all of these options:    Drink plenty of fluids (prune juice may be helpful) and high fiber foods Colace 100 mg by mouth twice a day  Senokot for constipation as directed and as needed Dulcolax (bisacodyl), take with full glass of water  Miralax (polyethylene glycol) once or twice a day as needed.  If you have tried all these things and are unable to have a bowel movement in the first 3-4 days after surgery call either your surgeon or your primary doctor.    If you experience loose stools or diarrhea, hold the medications until you stool forms back up.  If your symptoms do not get better within 1 week or if they get worse, check with your doctor.  If you experience "the worst abdominal pain ever" or develop nausea or vomiting, please contact the office immediately for further recommendations for treatment.   ITCHING:  If you experience itching with your medications, try taking only a single pain pill, or even  half a pain pill at a time.  You can also use Benadryl over the counter for itching or also to help with sleep.   TED HOSE STOCKINGS:  Use stockings on both legs until for at least 2 weeks or as directed by physician office. They may be removed at night for sleeping.  MEDICATIONS:  See your medication summary on the "After Visit Summary" that nursing will review with you.  You may have some home medications which will be placed on hold until you complete the course of blood thinner medication.  It is important for you to  complete the blood thinner medication as prescribed.  PRECAUTIONS:  If you experience chest pain or shortness of breath - call 911 immediately for transfer to the hospital emergency department.   If you develop a fever greater that 101 F, purulent drainage from wound, increased redness or drainage from wound, foul odor from the wound/dressing, or calf pain - CONTACT YOUR SURGEON.                                                   FOLLOW-UP APPOINTMENTS:  If you do not already have a post-op appointment, please call the office for an appointment to be seen by your surgeon.  Guidelines for how soon to be seen are listed in your "After Visit Summary", but are typically between 1-4 weeks after surgery.  OTHER INSTRUCTIONS:     MAKE SURE YOU:  Understand these instructions.  Get help right away if you are not doing well or get worse.    Thank you for letting us be a part of your medical care team.  It is a privilege we respect greatly.  We hope these instructions will help you stay on track for a fast and full recovery!

## 2022-06-28 NOTE — Plan of Care (Signed)
  Problem: Activity: Goal: Ability to tolerate increased activity will improve Outcome: Progressing   Problem: Pain Management: Goal: Pain level will decrease with appropriate interventions Outcome: Progressing   Problem: Pain Managment: Goal: General experience of comfort will improve Outcome: Progressing   Problem: Safety: Goal: Ability to remain free from injury will improve Outcome: Progressing

## 2022-06-29 DIAGNOSIS — Z85828 Personal history of other malignant neoplasm of skin: Secondary | ICD-10-CM | POA: Diagnosis not present

## 2022-06-29 DIAGNOSIS — Z471 Aftercare following joint replacement surgery: Secondary | ICD-10-CM | POA: Diagnosis not present

## 2022-06-29 DIAGNOSIS — G4733 Obstructive sleep apnea (adult) (pediatric): Secondary | ICD-10-CM | POA: Diagnosis not present

## 2022-06-29 DIAGNOSIS — M5136 Other intervertebral disc degeneration, lumbar region: Secondary | ICD-10-CM | POA: Diagnosis not present

## 2022-06-29 DIAGNOSIS — Z6831 Body mass index (BMI) 31.0-31.9, adult: Secondary | ICD-10-CM | POA: Diagnosis not present

## 2022-06-29 DIAGNOSIS — Z7952 Long term (current) use of systemic steroids: Secondary | ICD-10-CM | POA: Diagnosis not present

## 2022-06-29 DIAGNOSIS — H269 Unspecified cataract: Secondary | ICD-10-CM | POA: Diagnosis not present

## 2022-06-29 DIAGNOSIS — E785 Hyperlipidemia, unspecified: Secondary | ICD-10-CM | POA: Diagnosis not present

## 2022-06-29 DIAGNOSIS — M1990 Unspecified osteoarthritis, unspecified site: Secondary | ICD-10-CM | POA: Diagnosis not present

## 2022-06-29 DIAGNOSIS — Z792 Long term (current) use of antibiotics: Secondary | ICD-10-CM | POA: Diagnosis not present

## 2022-06-29 DIAGNOSIS — Z96611 Presence of right artificial shoulder joint: Secondary | ICD-10-CM | POA: Diagnosis not present

## 2022-06-29 DIAGNOSIS — I480 Paroxysmal atrial fibrillation: Secondary | ICD-10-CM | POA: Diagnosis not present

## 2022-06-29 DIAGNOSIS — M109 Gout, unspecified: Secondary | ICD-10-CM | POA: Diagnosis not present

## 2022-06-29 DIAGNOSIS — I5030 Unspecified diastolic (congestive) heart failure: Secondary | ICD-10-CM | POA: Diagnosis not present

## 2022-06-29 DIAGNOSIS — G629 Polyneuropathy, unspecified: Secondary | ICD-10-CM | POA: Diagnosis not present

## 2022-06-29 DIAGNOSIS — J4489 Other specified chronic obstructive pulmonary disease: Secondary | ICD-10-CM | POA: Diagnosis not present

## 2022-06-29 DIAGNOSIS — I350 Nonrheumatic aortic (valve) stenosis: Secondary | ICD-10-CM | POA: Diagnosis not present

## 2022-06-29 DIAGNOSIS — M797 Fibromyalgia: Secondary | ICD-10-CM | POA: Diagnosis not present

## 2022-06-29 DIAGNOSIS — Z86010 Personal history of colonic polyps: Secondary | ICD-10-CM | POA: Diagnosis not present

## 2022-06-29 DIAGNOSIS — G43909 Migraine, unspecified, not intractable, without status migrainosus: Secondary | ICD-10-CM | POA: Diagnosis not present

## 2022-06-29 DIAGNOSIS — I110 Hypertensive heart disease with heart failure: Secondary | ICD-10-CM | POA: Diagnosis not present

## 2022-06-29 DIAGNOSIS — D649 Anemia, unspecified: Secondary | ICD-10-CM | POA: Diagnosis not present

## 2022-06-29 DIAGNOSIS — M069 Rheumatoid arthritis, unspecified: Secondary | ICD-10-CM | POA: Diagnosis not present

## 2022-06-30 DIAGNOSIS — Z471 Aftercare following joint replacement surgery: Secondary | ICD-10-CM | POA: Diagnosis not present

## 2022-06-30 LAB — AEROBIC/ANAEROBIC CULTURE W GRAM STAIN (SURGICAL/DEEP WOUND)

## 2022-07-01 LAB — AEROBIC/ANAEROBIC CULTURE W GRAM STAIN (SURGICAL/DEEP WOUND)

## 2022-07-02 LAB — AEROBIC/ANAEROBIC CULTURE W GRAM STAIN (SURGICAL/DEEP WOUND)
Culture: NO GROWTH
Gram Stain: NONE SEEN

## 2022-07-04 ENCOUNTER — Encounter: Payer: Self-pay | Admitting: Internal Medicine

## 2022-07-05 DIAGNOSIS — Z471 Aftercare following joint replacement surgery: Secondary | ICD-10-CM | POA: Diagnosis not present

## 2022-07-05 DIAGNOSIS — I110 Hypertensive heart disease with heart failure: Secondary | ICD-10-CM | POA: Diagnosis not present

## 2022-07-05 DIAGNOSIS — I480 Paroxysmal atrial fibrillation: Secondary | ICD-10-CM | POA: Diagnosis not present

## 2022-07-05 DIAGNOSIS — I5030 Unspecified diastolic (congestive) heart failure: Secondary | ICD-10-CM | POA: Diagnosis not present

## 2022-07-05 DIAGNOSIS — Z96611 Presence of right artificial shoulder joint: Secondary | ICD-10-CM | POA: Diagnosis not present

## 2022-07-05 DIAGNOSIS — J4489 Other specified chronic obstructive pulmonary disease: Secondary | ICD-10-CM | POA: Diagnosis not present

## 2022-07-11 ENCOUNTER — Encounter: Payer: Self-pay | Admitting: Internal Medicine

## 2022-07-11 NOTE — Telephone Encounter (Signed)
Form printed out and placed in provider box.

## 2022-07-12 DIAGNOSIS — I5030 Unspecified diastolic (congestive) heart failure: Secondary | ICD-10-CM | POA: Diagnosis not present

## 2022-07-12 DIAGNOSIS — I110 Hypertensive heart disease with heart failure: Secondary | ICD-10-CM | POA: Diagnosis not present

## 2022-07-12 DIAGNOSIS — I480 Paroxysmal atrial fibrillation: Secondary | ICD-10-CM | POA: Diagnosis not present

## 2022-07-12 DIAGNOSIS — Z96611 Presence of right artificial shoulder joint: Secondary | ICD-10-CM | POA: Diagnosis not present

## 2022-07-12 DIAGNOSIS — Z471 Aftercare following joint replacement surgery: Secondary | ICD-10-CM | POA: Diagnosis not present

## 2022-07-12 DIAGNOSIS — J4489 Other specified chronic obstructive pulmonary disease: Secondary | ICD-10-CM | POA: Diagnosis not present

## 2022-07-14 ENCOUNTER — Other Ambulatory Visit: Payer: Self-pay | Admitting: Internal Medicine

## 2022-07-14 NOTE — Telephone Encounter (Signed)
Prescription Request  07/14/2022  LOV: 06/19/2022  What is the name of the medication or equipment? Oxycodone HCl 20 MG TABS   Have you contacted your pharmacy to request a refill? No   Which pharmacy would you like this sent to?  [id number] - [place], [place] - [address] AT [facility] [address] [facility] [address] [place] [address] Phone: [phone] Fax: [phone]  Patient notified that their request is being sent to the clinical staff for review and that they should receive a response within 2 business days.   Please advise at [facility] [phone]

## 2022-07-17 DIAGNOSIS — Z96611 Presence of right artificial shoulder joint: Secondary | ICD-10-CM | POA: Diagnosis not present

## 2022-07-17 MED ORDER — OXYCODONE HCL 20 MG PO TABS: 1.0000 | ORAL_TABLET | Freq: Two times a day (BID) | ORAL | 0 refills | Status: DC | PRN

## 2022-07-17 NOTE — Telephone Encounter (Signed)
Patient called in to follow up on this refill. Thank you!

## 2022-07-17 NOTE — Telephone Encounter (Signed)
Name of Medication: Oxycodone 20mg  Name of Pharmacy: [facility]. [place] and [address] Last [place] or Written Date and Quantity: 06-19-22 #60 Last Office Visit and Type: 06-19-22 Next Office Visit and Type: 09-18-22 Last Controlled Substance Agreement Date: 03-30-22 Last UDS: 03-30-22

## 2022-07-18 DIAGNOSIS — Z96611 Presence of right artificial shoulder joint: Secondary | ICD-10-CM | POA: Diagnosis not present

## 2022-07-18 DIAGNOSIS — I480 Paroxysmal atrial fibrillation: Secondary | ICD-10-CM | POA: Diagnosis not present

## 2022-07-18 DIAGNOSIS — I110 Hypertensive heart disease with heart failure: Secondary | ICD-10-CM | POA: Diagnosis not present

## 2022-07-18 DIAGNOSIS — I5030 Unspecified diastolic (congestive) heart failure: Secondary | ICD-10-CM | POA: Diagnosis not present

## 2022-07-18 DIAGNOSIS — J4489 Other specified chronic obstructive pulmonary disease: Secondary | ICD-10-CM | POA: Diagnosis not present

## 2022-07-18 DIAGNOSIS — Z471 Aftercare following joint replacement surgery: Secondary | ICD-10-CM | POA: Diagnosis not present

## 2022-07-25 DIAGNOSIS — I5030 Unspecified diastolic (congestive) heart failure: Secondary | ICD-10-CM | POA: Diagnosis not present

## 2022-07-25 DIAGNOSIS — Z96611 Presence of right artificial shoulder joint: Secondary | ICD-10-CM | POA: Diagnosis not present

## 2022-07-25 DIAGNOSIS — J4489 Other specified chronic obstructive pulmonary disease: Secondary | ICD-10-CM | POA: Diagnosis not present

## 2022-07-25 DIAGNOSIS — Z471 Aftercare following joint replacement surgery: Secondary | ICD-10-CM | POA: Diagnosis not present

## 2022-07-25 DIAGNOSIS — I480 Paroxysmal atrial fibrillation: Secondary | ICD-10-CM | POA: Diagnosis not present

## 2022-07-25 DIAGNOSIS — I110 Hypertensive heart disease with heart failure: Secondary | ICD-10-CM | POA: Diagnosis not present

## 2022-07-27 ENCOUNTER — Other Ambulatory Visit: Payer: Self-pay

## 2022-07-28 DIAGNOSIS — S41001A Unspecified open wound of right shoulder, initial encounter: Secondary | ICD-10-CM | POA: Diagnosis not present

## 2022-07-29 DIAGNOSIS — M797 Fibromyalgia: Secondary | ICD-10-CM | POA: Diagnosis not present

## 2022-07-29 DIAGNOSIS — D649 Anemia, unspecified: Secondary | ICD-10-CM | POA: Diagnosis not present

## 2022-07-29 DIAGNOSIS — Z6831 Body mass index (BMI) 31.0-31.9, adult: Secondary | ICD-10-CM | POA: Diagnosis not present

## 2022-07-29 DIAGNOSIS — M5136 Other intervertebral disc degeneration, lumbar region: Secondary | ICD-10-CM | POA: Diagnosis not present

## 2022-07-29 DIAGNOSIS — G4733 Obstructive sleep apnea (adult) (pediatric): Secondary | ICD-10-CM | POA: Diagnosis not present

## 2022-07-29 DIAGNOSIS — E785 Hyperlipidemia, unspecified: Secondary | ICD-10-CM | POA: Diagnosis not present

## 2022-07-29 DIAGNOSIS — M1990 Unspecified osteoarthritis, unspecified site: Secondary | ICD-10-CM | POA: Diagnosis not present

## 2022-07-29 DIAGNOSIS — G629 Polyneuropathy, unspecified: Secondary | ICD-10-CM | POA: Diagnosis not present

## 2022-07-29 DIAGNOSIS — Z7952 Long term (current) use of systemic steroids: Secondary | ICD-10-CM | POA: Diagnosis not present

## 2022-07-29 DIAGNOSIS — I480 Paroxysmal atrial fibrillation: Secondary | ICD-10-CM | POA: Diagnosis not present

## 2022-07-29 DIAGNOSIS — Z85828 Personal history of other malignant neoplasm of skin: Secondary | ICD-10-CM | POA: Diagnosis not present

## 2022-07-29 DIAGNOSIS — Z792 Long term (current) use of antibiotics: Secondary | ICD-10-CM | POA: Diagnosis not present

## 2022-07-29 DIAGNOSIS — G43909 Migraine, unspecified, not intractable, without status migrainosus: Secondary | ICD-10-CM | POA: Diagnosis not present

## 2022-07-29 DIAGNOSIS — M109 Gout, unspecified: Secondary | ICD-10-CM | POA: Diagnosis not present

## 2022-07-29 DIAGNOSIS — I5030 Unspecified diastolic (congestive) heart failure: Secondary | ICD-10-CM | POA: Diagnosis not present

## 2022-07-29 DIAGNOSIS — M069 Rheumatoid arthritis, unspecified: Secondary | ICD-10-CM | POA: Diagnosis not present

## 2022-07-29 DIAGNOSIS — I110 Hypertensive heart disease with heart failure: Secondary | ICD-10-CM | POA: Diagnosis not present

## 2022-07-29 DIAGNOSIS — I350 Nonrheumatic aortic (valve) stenosis: Secondary | ICD-10-CM | POA: Diagnosis not present

## 2022-07-29 DIAGNOSIS — Z96611 Presence of right artificial shoulder joint: Secondary | ICD-10-CM | POA: Diagnosis not present

## 2022-07-29 DIAGNOSIS — H269 Unspecified cataract: Secondary | ICD-10-CM | POA: Diagnosis not present

## 2022-07-29 DIAGNOSIS — Z86010 Personal history of colonic polyps: Secondary | ICD-10-CM | POA: Diagnosis not present

## 2022-07-29 DIAGNOSIS — Z471 Aftercare following joint replacement surgery: Secondary | ICD-10-CM | POA: Diagnosis not present

## 2022-07-29 DIAGNOSIS — J4489 Other specified chronic obstructive pulmonary disease: Secondary | ICD-10-CM | POA: Diagnosis not present

## 2022-07-31 DIAGNOSIS — I480 Paroxysmal atrial fibrillation: Secondary | ICD-10-CM | POA: Diagnosis not present

## 2022-07-31 DIAGNOSIS — I110 Hypertensive heart disease with heart failure: Secondary | ICD-10-CM | POA: Diagnosis not present

## 2022-07-31 DIAGNOSIS — Z471 Aftercare following joint replacement surgery: Secondary | ICD-10-CM | POA: Diagnosis not present

## 2022-07-31 DIAGNOSIS — I5030 Unspecified diastolic (congestive) heart failure: Secondary | ICD-10-CM | POA: Diagnosis not present

## 2022-07-31 DIAGNOSIS — J4489 Other specified chronic obstructive pulmonary disease: Secondary | ICD-10-CM | POA: Diagnosis not present

## 2022-07-31 DIAGNOSIS — Z96611 Presence of right artificial shoulder joint: Secondary | ICD-10-CM | POA: Diagnosis not present

## 2022-08-02 ENCOUNTER — Telehealth: Payer: Self-pay | Admitting: Cardiovascular Disease

## 2022-08-02 ENCOUNTER — Other Ambulatory Visit: Payer: Self-pay | Admitting: Internal Medicine

## 2022-08-02 DIAGNOSIS — I5032 Chronic diastolic (congestive) heart failure: Secondary | ICD-10-CM

## 2022-08-02 NOTE — Telephone Encounter (Signed)
Spoke with patient's daughter and she stated that the patient has gained approximately 15 lbs in about a week. She hasn't gotten on a scaled but stated that she looks to be gaining about 2-3 lbs daily and is uncomfortable and having trouble walking. Instructed patient's daughter that she needs to present to the nearest ED so that she can be assessed and evaluated further for possible IV treatment. Patient's daughter understood with read back.

## 2022-08-02 NOTE — Telephone Encounter (Signed)
Pt c/o swelling: STAT is pt has developed SOB within 24 hours  If swelling, where is the swelling located? Both legs, unable to walk  How much weight have you gained and in what time span? 15lbs in past 3days  Have you gained 3 pounds in a day or 5 pounds in a week? yes  Do you have a log of your daily weights (if so, list)? 270lbs  Are you currently taking a fluid pill? yes  Are you currently SOB? no  Have you traveled recently? no

## 2022-08-07 DIAGNOSIS — B351 Tinea unguium: Secondary | ICD-10-CM | POA: Diagnosis not present

## 2022-08-07 DIAGNOSIS — M79674 Pain in right toe(s): Secondary | ICD-10-CM | POA: Diagnosis not present

## 2022-08-07 DIAGNOSIS — M79675 Pain in left toe(s): Secondary | ICD-10-CM | POA: Diagnosis not present

## 2022-08-07 DIAGNOSIS — L600 Ingrowing nail: Secondary | ICD-10-CM | POA: Diagnosis not present

## 2022-08-07 DIAGNOSIS — S9032XA Contusion of left foot, initial encounter: Secondary | ICD-10-CM | POA: Diagnosis not present

## 2022-08-07 DIAGNOSIS — S90122A Contusion of left lesser toe(s) without damage to nail, initial encounter: Secondary | ICD-10-CM | POA: Diagnosis not present

## 2022-08-07 DIAGNOSIS — Z96611 Presence of right artificial shoulder joint: Secondary | ICD-10-CM | POA: Diagnosis not present

## 2022-08-07 DIAGNOSIS — M79672 Pain in left foot: Secondary | ICD-10-CM | POA: Diagnosis not present

## 2022-08-08 DIAGNOSIS — Z471 Aftercare following joint replacement surgery: Secondary | ICD-10-CM | POA: Diagnosis not present

## 2022-08-08 DIAGNOSIS — I110 Hypertensive heart disease with heart failure: Secondary | ICD-10-CM | POA: Diagnosis not present

## 2022-08-08 DIAGNOSIS — I5030 Unspecified diastolic (congestive) heart failure: Secondary | ICD-10-CM | POA: Diagnosis not present

## 2022-08-08 DIAGNOSIS — Z96611 Presence of right artificial shoulder joint: Secondary | ICD-10-CM | POA: Diagnosis not present

## 2022-08-08 DIAGNOSIS — I480 Paroxysmal atrial fibrillation: Secondary | ICD-10-CM | POA: Diagnosis not present

## 2022-08-08 DIAGNOSIS — J4489 Other specified chronic obstructive pulmonary disease: Secondary | ICD-10-CM | POA: Diagnosis not present

## 2022-08-14 ENCOUNTER — Other Ambulatory Visit: Payer: Self-pay | Admitting: Internal Medicine

## 2022-08-14 MED ORDER — OXYCODONE HCL 20 MG PO TABS: 1.0000 | ORAL_TABLET | Freq: Two times a day (BID) | ORAL | 0 refills | Status: DC | PRN

## 2022-08-14 NOTE — Telephone Encounter (Signed)
Name of Medication: Oxycodone 20mg  Name of Pharmacy: [facility]. [place] and [address] Last [place] or Written Date and Quantity: 07-17-22 #60 Last Office Visit and Type: 06-19-22 Next Office Visit and Type: 09-18-22 Last Controlled Substance Agreement Date: 03-30-22 Last UDS: 03-30-22

## 2022-08-14 NOTE — Telephone Encounter (Signed)
Prescription Request  08/14/2022  LOV: 06/19/2022  What is the name of the medication or equipment? Oxycodone HCl 20 MG TABS   Have you contacted your pharmacy to request a refill? No   Which pharmacy would you like this sent to?  [id number] - [place], [place] - [address] AT [facility] [address] [facility] [address] [place] [address] Phone: [phone] Fax: [phone]   Patient notified that their request is being sent to the clinical staff for review and that they should receive a response within 2 business days.   Please advise at [facility] [name] [phone]

## 2022-08-16 DIAGNOSIS — I5030 Unspecified diastolic (congestive) heart failure: Secondary | ICD-10-CM | POA: Diagnosis not present

## 2022-08-16 DIAGNOSIS — Z471 Aftercare following joint replacement surgery: Secondary | ICD-10-CM | POA: Diagnosis not present

## 2022-08-16 DIAGNOSIS — I110 Hypertensive heart disease with heart failure: Secondary | ICD-10-CM | POA: Diagnosis not present

## 2022-08-16 DIAGNOSIS — Z96611 Presence of right artificial shoulder joint: Secondary | ICD-10-CM | POA: Diagnosis not present

## 2022-08-16 DIAGNOSIS — I480 Paroxysmal atrial fibrillation: Secondary | ICD-10-CM | POA: Diagnosis not present

## 2022-08-16 DIAGNOSIS — J4489 Other specified chronic obstructive pulmonary disease: Secondary | ICD-10-CM | POA: Diagnosis not present

## 2022-08-23 DIAGNOSIS — J4489 Other specified chronic obstructive pulmonary disease: Secondary | ICD-10-CM | POA: Diagnosis not present

## 2022-08-23 DIAGNOSIS — I5030 Unspecified diastolic (congestive) heart failure: Secondary | ICD-10-CM | POA: Diagnosis not present

## 2022-08-23 DIAGNOSIS — Z96611 Presence of right artificial shoulder joint: Secondary | ICD-10-CM | POA: Diagnosis not present

## 2022-08-23 DIAGNOSIS — I480 Paroxysmal atrial fibrillation: Secondary | ICD-10-CM | POA: Diagnosis not present

## 2022-08-23 DIAGNOSIS — I110 Hypertensive heart disease with heart failure: Secondary | ICD-10-CM | POA: Diagnosis not present

## 2022-08-23 DIAGNOSIS — Z471 Aftercare following joint replacement surgery: Secondary | ICD-10-CM | POA: Diagnosis not present

## 2022-08-23 NOTE — Progress Notes (Signed)
Cardiology Office Note    Date:  08/25/2022   ID:  Kiasia, Pelky 07-14-1950, MRN [id number]  PCP:  Karie Schwalbe, MD  Cardiologist:  Julien Nordmann, MD  Electrophysiologist:  None   Chief Complaint: Follow up  History of Present Illness:   Tina Pacheco is a 72 y.o. female with history of nonobstructive CAD by LHC in 05/2020, HFpEF, PAF on Eliquis, CKD stage III, HTN, HLD, anemia, COPD, rheumatoid arthritis, fibromyalgia, falls, morbid obesity, H. pylori, and OSA who presents for follow-up of HFpEF and A-fib.   LHC in 2012 showed nonobstructive CAD.  She was diagnosed with A. fib in 2012.  Echo in 2016 showed an EF of 55 to 60%, no regional wall motion abnormalities, grade 2 diastolic dysfunction, and mild mitral regurgitation.  She was admitted in 02/2019 with weakness and multiple falls and acute on chronic diastolic CHF.  Echo showed an EF of 55 to 60%, mild LVH, grade 1 diastolic dysfunction, no regional wall motion abnormalities, mildly to moderately dilated left atrium, moderate mitral valve calcification without regurgitation or stenosis.  She was admitted in 12/2019 with volume overload and weight gain.  She was IV diuresed.  Echo showed an EF of 60 to 65%, no regional wall motion abnormalities, mild LVH, normal LV diastolic function parameters, normal RV systolic function and ventricular cavity size, trivial mitral regurgitation, and trivial aortic valve insufficiency.  She was admitted in 05/2020 with  acute hypoxic respiratory failure due to pulmonary edema in the setting of acute on chronic diastolic CHF and A. fib with RVR.  She converted to sinus rhythm with amiodarone.  Echo demonstrated an EF of 60 to 65%, no regional wall motion abnormalities, mild LVH, grade 1 diastolic dysfunction, normal RV systolic function and ventricular cavity size, and a moderately dilated left atrium.  Lexiscan MPI was abnormal with a large in size, moderate in severity, completely reversible  defect involving the mid anterior, anterolateral, and inferolateral segments as well as the apical anterior and lateral segments.  The defect was concerning for ischemia, though artifact could not be excluded.  LVEF was normal.  Attenuation corrected CT imaging showed coronary artery calcification, aortic atherosclerosis, and dense mitral annular calcification.  Overall, this was an intermediate restudy.  Given abnormal MPI, she underwent LHC on 06/04/2020 which showed no evidence of obstructive CAD with ostial to mid LAD 40% stenosis and distal LAD 30% stenosis.   She was seen in the office in 07/2020 with dramatic documented weight loss trending from 353 in 06/2020 trending to 301 in 07/2020.  Recommendation to continue torsemide 40 mg daily with an extra 40 mg as needed and hold metolazone unless weight was greater than 310 pounds.  Phone note after that visit showed to the patient was woken up on 08/03/2020 with chest pain and shortness of breath lasting approximately 45 minutes.  Patient indicated EMS came out with patient reporting EKG showed A-fib.  Note indicates she refused transport to the ED.   She was admitted to the hospital in 03/2021 with acute metabolic encephalopathy felt to likely be related to polypharmacy with oxycodone, gabapentin, and recent addition of Phenergan in the context of acute on CKD.  She had fallen numerous times prior to her admission.  Head CT was negative for acute process.  Ammonia normal.  High-sensitivity troponin normal x3.  BNP 133.  EKG showed sinus rhythm without acute ST-T changes.   She was admitted to the hospital in 08/2021 with weakness  and acute on CKD stage IIIb with severe hypokalemia with a potassium of 2.5.  She had been taking metolazone on a daily basis.  Admission serum creatinine was 2.37 with a baseline around 1.2.  Follow-up labs in the outpatient setting have showed continued improvement in renal function.  She had been maintained on torsemide 40 mg daily and  metolazone 2.5 mg as needed for weight gain greater than 280 pounds.  She was last seen in the office in 02/2022 and was without symptoms of angina or cardiac decompensation.  Her weight was down greater than 50 pounds compared to revisit in 06/2021, however she continued to note fluctuations with intermittent rapid weight gain.  Caretaker indicated this was in the setting of increased sodium intake at times.  She was taking torsemide 40 mg daily with an additional 20 mg intermittently.  She was also intermittently taking metolazone for a weight greater than 280 pounds.  Lower extremity swelling was significantly improved.  She was maintaining sinus rhythm.  Our office was notified earlier this month with the patient noticing increased lower extremity swelling and a possible approximate 15 pound weight gain (had not gotten on a scale).  ER evaluation was recommended.  I do not see that she was evaluated at that time.  She comes in today with her caretaker.  She is without symptoms of fever and or cardiac decompensation.  She has been dealing with postoperative recovery from revision of right shoulder replacement complicated by delayed wound healing and infection requiring wound VAC.  She has also noted fluctuations in weight, most recently earlier this month where she took metolazone daily for 5 days with regularly scheduled torsemide.  During this timeframe she was without dyspnea, though noted increase in lower extremity swelling.  Over this past week she has been been a euvolemic with minimal lower extremity swelling.  No progressive orthopnea or early satiety.  She is eating smaller amounts of sodium.  She did have one fall since she was last seen, though did not hit her head or suffer LOC.  This was in the setting of going to the restroom, falling on her buttocks.  No hematochezia or melena.  Her weight is down 9 pounds today when compared to her visit in 02/2022.  No dizziness, presyncope, or  syncope.   Labs independently reviewed: 06/2022 - potassium 3.5, BUN 40, serum creatinine 1.46, Hgb 11.7, PLT 207 05/2022 - albumin 3.4, AST/ALT normal 11/2021 - TSH normal, TC 149, TG 160, HDL 46, LDL 71 08/2021 - magnesium 1.6  Past Medical History:  Diagnosis Date   (HFpEF) heart failure with preserved ejection fraction (HCC)    a.) TTE 08/10/2014: EF 55-60%, mild MAC, mild LAE, mild MR, PASP 38; b.) TTE 03/31/2019: EF 55-60%, mild LVH, mild-mod LAW, mod MAC, mild-mod AoV sclerosis, triv PR, G1DD; c.) TTE 05/29/2020: EF 60-65%, mild LVH, mod LAE, G1DD   Anemia    Aortic atherosclerosis (HCC)    Asthma    Basal cell carcinoma    CAD (coronary artery disease)    a.) LHC 05/24/2010 - nonobstructive CAD - med mgmt; b.) LHC 06/04/2020: 30% dLAD, 40% o-mLAD - med mgmt   Chronic, continuous use of opioids    Claustrophobia    COPD (chronic obstructive pulmonary disease) (HCC)    DDD (degenerative disc disease), lumbosacral    Diuretic-induced hypokalemia    Dyslipidemia    Fibromyalgia    Frequent falls    GERD (gastroesophageal reflux disease)    Gouty  arthropathy    Hemihypertrophy    History of bilateral cataract extraction 2018   Hyperplastic colonic polyp 2003   Hypertension    Long term current use of amiodarone    Long term current use of anticoagulant    a.) apixaban   Long term current use of immunosuppressive drug    a.) certolizumab pegol  + predisone for RA diagnosis   MDD (major depressive disorder)    Migraines    Morbid obesity (HCC)    Nontraumatic complete tear of rotator cuff, left    OSA (obstructive sleep apnea)    a.) non-complaint with prescribed nocturnal PAP therapy   Osteoarthritis    PAF (paroxysmal atrial fibrillation) (HCC)    a.) CHA2DS2-VASc = 5 (age, sex, CHF, HTN, vascular disease history); b.) rate and rhythm maintained on oral amiodarone + metoprolol succinate; chronically anticoagulated with standard dose apixaban   Pneumonia    Prediabetes     Rheumatoid arthritis (HCC)    a.) on certolizumab pegol + predisone   Stage 3 chronic kidney disease (HCC)     Past Surgical History:  Procedure Laterality Date   ABDOMINAL HYSTERECTOMY     APPLICATION OF WOUND VAC Right 08/09/2017   Procedure: APPLICATION OF WOUND VAC;  Surgeon: Recardo Evangelist, DPM;  Location: [facility];  Service: Podiatry;  Laterality: Right;   CARDIAC CATHETERIZATION  05/24/2010   nonobstructive CAD   CATARACT EXTRACTION W/ INTRAOCULAR LENS IMPLANT Right 06/12/2016   Dr. Mia Creek   CATARACT EXTRACTION W/ INTRAOCULAR LENS IMPLANT Left 06/26/2016   Dr. Mia [place]   CESAREAN SECTION     CHOLECYSTECTOMY     COLONOSCOPY  06/12/2011   Procedure: COLONOSCOPY;  Surgeon: Charna Elizabeth, MD;  Location: [facility];  Service: Endoscopy;  Laterality: N/A;   COLONOSCOPY N/A 03/17/2013   Procedure: COLONOSCOPY;  Surgeon: Charna Elizabeth, MD;  Location: [facility];  Service: Endoscopy;  Laterality: N/A;   FOOT ARTHRODESIS Right 07/13/2017   Procedure: ARTHRODESIS FOOT-MULTI.FUSIONS (6 JOINTS);  Surgeon: Recardo Evangelist, DPM;  Location: [facility];  Service: Podiatry;  Laterality: Right;   IRRIGATION AND DEBRIDEMENT FOOT Right 08/09/2017   Procedure: IRRIGATION AND DEBRIDEMENT FOOT;  Surgeon: Recardo Evangelist, DPM;  Location: [facility];  Service: Podiatry;  Laterality: Right;   KNEE ARTHROSCOPY Bilateral    bilateral   LEFT HEART CATH AND CORONARY ANGIOGRAPHY N/A 06/04/2020   Procedure: LEFT HEART CATH AND CORONARY ANGIOGRAPHY;  Surgeon: Iran Ouch, MD;  Location: [facility];  Service: Cardiovascular;  Laterality: N/A;   REVERSE SHOULDER ARTHROPLASTY Right 11/16/2020   Procedure: REVERSE SHOULDER ARTHROPLASTY;  Surgeon: Christena Flake, MD;  Location: [facility];  Service: Orthopedics;  Laterality: Right;   SHOULDER CLOSED REDUCTION Right 11/11/2020   Procedure: CLOSED REDUCTION SHOULDER;  Surgeon: Christena Flake, MD;  Location: [facility];  Service: Orthopedics;   Laterality: Right;   SHOULDER INJECTION Right 11/11/2020   Procedure: SHOULDER INJECTION;  Surgeon: Christena Flake, MD;  Location: [facility];  Service: Orthopedics;  Laterality: Right;   SINUSOTOMY     TOOTH EXTRACTION  12/2016   TOTAL SHOULDER REVISION Right 06/27/2022   Procedure: TOTAL SHOULDER REVISION;  Surgeon: Christena Flake, MD;  Location: [facility];  Service: Orthopedics;  Laterality: Right;    Current Medications: Current Meds  Medication Sig   amiodarone (PACERONE) 200 MG tablet TAKE 1 TABLET(200 MG) BY MOUTH DAILY (Patient taking differently: Take 200 mg by mouth at bedtime. TAKE 1 TABLET(200 MG) BY MOUTH DAILY)  apixaban (ELIQUIS) 5 MG TABS tablet Take 1 tablet (5 mg total) by mouth 2 (two) times daily.   atorvastatin (LIPITOR) 40 MG tablet TAKE 1 TABLET(40 MG) BY MOUTH DAILY   ciprofloxacin (CIPRO) 500 MG tablet Take 500 mg by mouth 2 (two) times daily.   docusate sodium (COLACE) 100 MG capsule Take 1 capsule (100 mg total) by mouth 2 (two) times daily.   ketoconazole (NIZORAL) 2 % cream Apply 1 Application topically as needed.   metolazone (ZAROXOLYN) 2.5 MG tablet TAKE 1 TABLET BY MOUTH DAILY AS NEEDED. MAY GIVE 1 TABLET AS NEEDED FOR WEIGHT GAIN OF 5 LB IN 1 WEEK   metoprolol succinate (TOPROL-XL) 50 MG 24 hr tablet TAKE 1 TABLET BY MOUTH EVERY DAY WITH OR IMMEDIATELY FOLLOWING A MEAL (Patient taking differently: Take 50 mg by mouth at bedtime. TAKE 1 TABLET BY MOUTH EVERY DAY WITH OR IMMEDIATELY FOLLOWING A MEAL)   montelukast (SINGULAIR) 10 MG tablet TAKE 1 TABLET BY MOUTH AT BEDTIME   nitroGLYCERIN (NITROSTAT) 0.4 MG SL tablet Place 1 tablet (0.4 mg total) under the tongue every 5 (five) minutes as needed for chest pain.   nystatin (MYCOSTATIN/NYSTOP) powder Apply 1 application topically 3 (three) times daily as needed.   omeprazole (PRILOSEC) 40 MG capsule Take 1 capsule (40 mg total) by mouth at bedtime.   ondansetron (ZOFRAN-ODT) 4 MG disintegrating tablet DISSOLVE 1  TABLET(4 MG) ON THE TONGUE EVERY 8 HOURS AS NEEDED FOR NAUSEA OR VOMITING   Oxycodone HCl 20 MG TABS Take 1 tablet (20 mg total) by mouth 2 (two) times daily as needed.   polyethylene glycol (MIRALAX / GLYCOLAX) packet Take 17 g by mouth daily as needed for mild constipation.   Potassium Chloride 40 MEQ/15ML (20%) SOLN Take 7.5 mLs by mouth daily.   predniSONE (DELTASONE) 5 MG tablet Take 1 tablet (5 mg total) by mouth daily with breakfast. (Patient taking differently: Take 5 mg by mouth at bedtime.)   promethazine (PHENERGAN) 12.5 MG tablet Take 1 tablet (12.5 mg total) by mouth every 6 (six) hours as needed for nausea or vomiting.   spironolactone (ALDACTONE) 25 MG tablet TAKE 1 TABLET(25 MG) BY MOUTH DAILY   terazosin (HYTRIN) 10 MG capsule TAKE 1 CAPSULE(10 MG) BY MOUTH AT BEDTIME   torsemide (DEMADEX) 20 MG tablet Take 1 tablet (20 mg total) by mouth daily. (Patient taking differently: Take 40 mg by mouth daily.)    Allergies:   Fluoxetine and Codeine   Social History   Socioeconomic History   Marital status: Married    Spouse name: Rexford Maus   Number of children: 1   Years of education: Not on file   Highest education level: Bachelor's degree (e.g., BA, AB, BS)  Occupational History   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  Tobacco Use   Smoking status: Never    Passive exposure: Past   Smokeless tobacco: Never  Vaping Use   Vaping Use: Never used  Substance and Sexual Activity   Alcohol use: No   Drug use: No   Sexual activity: Not Currently  Other Topics Concern   Not on file  Social History Narrative   No living will   Daughter should be decision maker   Would accept resuscitation attempts--but no prolonged ventilator or tube feeds   Social Determinants of Health   Financial Resource Strain: Low Risk  (06/19/2022)   Overall Financial Resource Strain (CARDIA)    Difficulty of  Paying Living Expenses: Not hard at all  Food Insecurity: No  Food Insecurity (06/27/2022)   Hunger Vital Sign    Worried About Running Out of Food in the Last Year: Never true    Ran Out of Food in the Last Year: Never true  Transportation Needs: No Transportation Needs (06/27/2022)   PRAPARE - Administrator, Civil Service (Medical): No    Lack of Transportation (Non-Medical): No  Physical Activity: Unknown (06/19/2022)   Exercise Vital Sign    Days of Exercise per Week: 0 days    Minutes of Exercise per Session: Not on file  Stress: No Stress Concern Present (06/19/2022)   [employer] of Occupational Health - Occupational Stress Questionnaire    Feeling of Stress : Only a little  Social Connections: Moderately Isolated (06/19/2022)   Social Connection and Isolation Panel [NHANES]    Frequency of Communication with Friends and Family: More than three times a week    Frequency of Social Gatherings with Friends and Family: Three times a week    Attends Religious Services: Never    Active Member of Clubs or Organizations: No    Attends Engineer, structural: Not on file    Marital Status: Married     Family History:  The patient's family history includes Arthritis in her father; Breast cancer in her maternal aunt and sister; Cancer in her sister; Diabetes type II in her sister; Emphysema in her father; Parkinsonism in her mother; Tuberculosis in her mother and sister. There is no history of Colon cancer, Esophageal cancer, Pancreatic cancer, or Stomach cancer.  ROS:   12-point review of systems is negative unless otherwise noted in the HPI.   EKGs/Labs/Other Studies Reviewed:    Studies reviewed were summarized above. The additional studies were reviewed today:  LHC 06/04/2020: Dist LAD lesion is 30% stenosed. Ost LAD to Mid LAD lesion is 40% stenosed.   1.  No evidence of obstructive coronary artery disease.  Relatively stable mild to moderate proximal LAD disease. 2.  Left ventricular angiography was not performed.   EF is normal by echo. 3.  Mildly elevated left ventricular end-diastolic pressure.   Recommendations: Continue medical therapy. Eliquis can be resumed tomorrow morning. __________   Eugenie Birks MPI 05/31/2020: Abnormal pharmacologic myocardial perfusion stress test. There is a large in size, moderate in severity, completely reversible defect involving the mid anterior, anterolateral, and inferolateral segments, as well as the apical anterior and lateral segments. Defect is concerning for ischemia, though artifact cannot be excluded (shifting breast attenuation). Left ventricular systolic function is normal (LVEF 58%). Attenuation correction CT shows coronary artery calcification, aortic atherosclerosis, and dense mitral annular calcification. This is an intermediate risk study. Sensitivity and specificity of the study are degraded by the patient's morbid obesity. __________   2D echo 05/29/2020: 1. Left ventricular ejection fraction, by estimation, is 60 to 65%. The  left ventricle has normal function. The left ventricle has no regional  wall motion abnormalities. There is mild left ventricular hypertrophy.  Left ventricular diastolic parameters  are consistent with Grade I diastolic dysfunction (impaired relaxation).   2. Right ventricular systolic function is normal. The right ventricular  size is normal. Tricuspid regurgitation signal is inadequate for assessing  PA pressure.   3. Left atrial size was moderately dilated.   EKG:  EKG is ordered today.  The EKG ordered today demonstrates, NSR, 60 bpm, normal QRS, nonspecific inferior st/t changes  Recent Labs: 09/01/2021: B Natriuretic Peptide 65.2  09/09/2021: Magnesium 1.6 12/27/2021: TSH 2.48 06/19/2022: ALT 10 08/25/2022: BUN 23; Creatinine, Ser 1.79; Hemoglobin 10.3; Platelets 232; Potassium 4.1; Sodium 133  Recent Lipid Panel    Component Value Date/Time   CHOL 149 12/27/2021 1202   TRIG 160.0 (H) 12/27/2021 1202   HDL 46.60  12/27/2021 1202   CHOLHDL 3 12/27/2021 1202   VLDL 32.0 12/27/2021 1202   LDLCALC 71 12/27/2021 1202    PHYSICAL EXAM:    VS:  BP 110/66 (BP Location: Left Arm, Patient Position: Sitting, Cuff Size: Normal) Comment (BP Location): left forearm  Pulse 60   Ht 5\' 3"  (1.6 m)   Wt 241 lb (109.3 kg)   SpO2 94%   BMI 42.69 kg/m   BMI: Body mass index is 42.69 kg/m.  Physical Exam Vitals reviewed.  Constitutional:      Appearance: She is well-developed.  HENT:     Head: Normocephalic and atraumatic.  Eyes:     General:        Right eye: No discharge.        Left eye: No discharge.  Neck:     Comments: JVD difficult to assess secondary to body habitus. Cardiovascular:     Rate and Rhythm: Normal rate and regular rhythm.     Heart sounds: Normal heart sounds, S1 normal and S2 normal. Heart sounds not distant. No midsystolic click and no opening snap. No murmur heard.    No friction rub.  Pulmonary:     Effort: Pulmonary effort is normal. No respiratory distress.     Breath sounds: Normal breath sounds. No decreased breath sounds, wheezing or rales.  Chest:     Chest wall: No tenderness.  Abdominal:     General: There is no distension.     Palpations: Abdomen is soft.     Tenderness: There is no abdominal tenderness.  Musculoskeletal:     Cervical back: Normal range of motion.     Comments: Mild bilateral pedal and ankle edema. Right upper extremity in sling. Clean, dry, and intact bandage noted along the anterior aspect of the right shoulder with mild surrounding erythema.  Skin:    General: Skin is warm and dry.     Nails: There is no clubbing.  Neurological:     Mental Status: She is alert and oriented to person, place, and time.  Psychiatric:        Speech: Speech normal.        Behavior: Behavior normal.        Thought Content: Thought content normal.        Judgment: Judgment normal.     Wt Readings from Last 3 Encounters:  08/25/22 241 lb (109.3 kg)  06/27/22  254 lb (115.2 kg)  06/19/22 254 lb (115.2 kg)     ASSESSMENT & PLAN:   Nonobstructive CAD: No symptoms suggestive of angina or cardiac decompensation.  Remains on apixaban in place of aspirin to minimize bleeding risk given underlying A-fib.  Continue aggressive risk factor modification and primary prevention including atorvastatin and metoprolol.  No indication for ischemic testing at this time.  PAF: Maintaining sinus rhythm with Toprol-XL 50 mg and amiodarone 200 mg.  CHA2DS2-VASc at least 5 (CHF, HTN, age x 1, vascular disease, sex category).  She remains on apixaban 5 mg twice daily and does not meet reduced dosing criteria.  LFT and TSH normal.  Check CBC.  HFpEF: Euvolemic and well compensated, to possibly volume depleted.  She does continue to note intermittent  fluctuations in her weight.  Today, her weight is down 9 pounds when compared to her visit in 02/2022.  She recently took metolazone daily for 5 days this past week with regularly scheduled torsemide.  With this, she noted improvement in lower extremity swelling.  Recommend leg elevation with continued torsemide 20 mg daily.  Would try to minimize metolazone use.  Check BMP.  Lymphedema: Much improved.  Continue leg elevation.  Follow-up with vascular surgery as directed.  HTN: Blood pressure is well-controlled in the office today.  She remains on metoprolol succinate and spironolactone.  HLD: LDL 71 with normal AST/ALT in 11/2021.  She remains on atorvastatin 40 mg.  Morbid obesity: Her weight is down another 9 pounds by our scale today when compared to her last visit in our office in 02/2022, at which time her weight was down 59 pounds when compared to her visit in 06/2021.  Fatigue: Stable.  Likely multifactorial, though not limited to, physical deconditioning, obesity, lymphedema, anemia, HFpEF, and chronic pain with fibromyalgia.  Hypokalemia: Potassium low normal last month.  Check BMP.  CKD stage III: Stable on last check.   Check BMP given 5 days of metolazone use this last week.  Normocytic anemia: Hgb stable to slightly improved on check last month.  Check CBC.   Disposition: F/u with Dr. Mariah Milling or an APP in 6 months.   Medication Adjustments/Labs and Tests Ordered: Current medicines are reviewed at length with the patient today.  Concerns regarding medicines are outlined above. Medication changes, Labs and Tests ordered today are summarized above and listed in the Patient Instructions accessible in Encounters.   Signed, Eula Listen, PA-C 08/25/2022 12:11 PM     [facility] [place] [address] [place], [place] [address] [phone]

## 2022-08-25 ENCOUNTER — Ambulatory Visit: Payer: Medicare Other | Attending: Physician Assistant | Admitting: Physician Assistant

## 2022-08-25 ENCOUNTER — Other Ambulatory Visit
Admission: RE | Admit: 2022-08-25 | Discharge: 2022-08-25 | Disposition: A | Discharge status: Home / Self Care | Payer: Medicare Other | Attending: Physician Assistant | Admitting: Physician Assistant

## 2022-08-25 ENCOUNTER — Encounter: Payer: Self-pay | Admitting: Physician Assistant

## 2022-08-25 VITALS — BP 110/66 | HR 60 | Ht 63.0 in | Wt 241.0 lb

## 2022-08-25 DIAGNOSIS — I890 Lymphedema, not elsewhere classified: Secondary | ICD-10-CM

## 2022-08-25 DIAGNOSIS — I10 Essential (primary) hypertension: Secondary | ICD-10-CM

## 2022-08-25 DIAGNOSIS — E876 Hypokalemia: Secondary | ICD-10-CM | POA: Diagnosis not present

## 2022-08-25 DIAGNOSIS — I2510 Atherosclerotic heart disease of native coronary artery without angina pectoris: Secondary | ICD-10-CM

## 2022-08-25 DIAGNOSIS — I5032 Chronic diastolic (congestive) heart failure: Secondary | ICD-10-CM

## 2022-08-25 DIAGNOSIS — D649 Anemia, unspecified: Secondary | ICD-10-CM

## 2022-08-25 DIAGNOSIS — Z6841 Body Mass Index (BMI) 40.0 and over, adult: Secondary | ICD-10-CM | POA: Insufficient documentation

## 2022-08-25 DIAGNOSIS — E782 Mixed hyperlipidemia: Secondary | ICD-10-CM | POA: Insufficient documentation

## 2022-08-25 DIAGNOSIS — R5383 Other fatigue: Secondary | ICD-10-CM

## 2022-08-25 DIAGNOSIS — N183 Chronic kidney disease, stage 3 unspecified: Secondary | ICD-10-CM | POA: Diagnosis not present

## 2022-08-25 DIAGNOSIS — E66813 Obesity, class 3: Secondary | ICD-10-CM

## 2022-08-25 DIAGNOSIS — I480 Paroxysmal atrial fibrillation: Secondary | ICD-10-CM

## 2022-08-25 LAB — BASIC METABOLIC PANEL
Anion gap: 10 (ref 5–15)
BUN: 23 mg/dL (ref 8–23)
CO2: 20 mmol/L — ABNORMAL LOW (ref 22–32)
Calcium: 9.7 mg/dL (ref 8.9–10.3)
Chloride: 103 mmol/L (ref 98–111)
Creatinine, Ser: 1.79 mg/dL — ABNORMAL HIGH (ref 0.44–1.00)
GFR, Estimated: 30 mL/min — ABNORMAL LOW (ref >60)
Glucose, Bld: 113 mg/dL — ABNORMAL HIGH (ref 70–99)
Potassium: 4.1 mmol/L (ref 3.5–5.1)
Sodium: 133 mmol/L — ABNORMAL LOW (ref 135–145)

## 2022-08-25 LAB — CBC
HCT: 33.5 % — ABNORMAL LOW (ref 36.0–46.0)
Hemoglobin: 10.3 g/dL — ABNORMAL LOW (ref 12.0–15.0)
MCH: 28.4 pg (ref 26.0–34.0)
MCHC: 30.7 g/dL (ref 30.0–36.0)
MCV: 92.3 fL (ref 80.0–100.0)
Platelets: 232 10*3/uL (ref 150–400)
RBC: 3.63 MIL/uL — ABNORMAL LOW (ref 3.87–5.11)
RDW: 15.3 % (ref 11.5–15.5)
WBC: 9.3 10*3/uL (ref 4.0–10.5)
nRBC: 0 % (ref 0.0–0.2)

## 2022-08-25 NOTE — Patient Instructions (Signed)
Medication Instructions:  No changes at this time.   *If you need a refill on your cardiac medications before your next appointment, please call your pharmacy*   Lab Work: BMET & CBC to be done today over at the [facility]. Stop at registration desk to check in.   If you have labs (blood work) drawn today and your tests are completely normal, you will receive your results only by: MyChart Message (if you have MyChart) OR A paper copy in the mail If you have any lab test that is abnormal or we need to change your treatment, we will call you to review the results.   Testing/Procedures: None   Follow-Up: At [facility], you and your health needs are our priority.  As part of our continuing mission to provide you with exceptional heart care, we have created designated Provider Care Teams.  These Care Teams include your primary Cardiologist (physician) and Advanced Practice Providers (APPs -  Physician Assistants and Nurse Practitioners) who all work together to provide you with the care you need, when you need it.   Your next appointment:   6 month(s)  Provider:   Julien Nordmann, MD or Eula Listen, PA-C

## 2022-08-28 DIAGNOSIS — G629 Polyneuropathy, unspecified: Secondary | ICD-10-CM | POA: Diagnosis not present

## 2022-08-28 DIAGNOSIS — H269 Unspecified cataract: Secondary | ICD-10-CM | POA: Diagnosis not present

## 2022-08-28 DIAGNOSIS — J4489 Other specified chronic obstructive pulmonary disease: Secondary | ICD-10-CM | POA: Diagnosis not present

## 2022-08-28 DIAGNOSIS — D649 Anemia, unspecified: Secondary | ICD-10-CM | POA: Diagnosis not present

## 2022-08-28 DIAGNOSIS — M109 Gout, unspecified: Secondary | ICD-10-CM | POA: Diagnosis not present

## 2022-08-28 DIAGNOSIS — M797 Fibromyalgia: Secondary | ICD-10-CM | POA: Diagnosis not present

## 2022-08-28 DIAGNOSIS — M5136 Other intervertebral disc degeneration, lumbar region: Secondary | ICD-10-CM | POA: Diagnosis not present

## 2022-08-28 DIAGNOSIS — I110 Hypertensive heart disease with heart failure: Secondary | ICD-10-CM | POA: Diagnosis not present

## 2022-08-28 DIAGNOSIS — G4733 Obstructive sleep apnea (adult) (pediatric): Secondary | ICD-10-CM | POA: Diagnosis not present

## 2022-08-28 DIAGNOSIS — M1990 Unspecified osteoarthritis, unspecified site: Secondary | ICD-10-CM | POA: Diagnosis not present

## 2022-08-28 DIAGNOSIS — M069 Rheumatoid arthritis, unspecified: Secondary | ICD-10-CM | POA: Diagnosis not present

## 2022-08-28 DIAGNOSIS — I5030 Unspecified diastolic (congestive) heart failure: Secondary | ICD-10-CM | POA: Diagnosis not present

## 2022-08-28 DIAGNOSIS — I480 Paroxysmal atrial fibrillation: Secondary | ICD-10-CM | POA: Diagnosis not present

## 2022-08-28 DIAGNOSIS — Z9181 History of falling: Secondary | ICD-10-CM | POA: Diagnosis not present

## 2022-08-28 DIAGNOSIS — Z7952 Long term (current) use of systemic steroids: Secondary | ICD-10-CM | POA: Diagnosis not present

## 2022-08-28 DIAGNOSIS — E785 Hyperlipidemia, unspecified: Secondary | ICD-10-CM | POA: Diagnosis not present

## 2022-08-28 DIAGNOSIS — G43909 Migraine, unspecified, not intractable, without status migrainosus: Secondary | ICD-10-CM | POA: Diagnosis not present

## 2022-08-28 DIAGNOSIS — Z85828 Personal history of other malignant neoplasm of skin: Secondary | ICD-10-CM | POA: Diagnosis not present

## 2022-08-28 DIAGNOSIS — Z86010 Personal history of colonic polyps: Secondary | ICD-10-CM | POA: Diagnosis not present

## 2022-08-28 DIAGNOSIS — I350 Nonrheumatic aortic (valve) stenosis: Secondary | ICD-10-CM | POA: Diagnosis not present

## 2022-08-28 DIAGNOSIS — Z471 Aftercare following joint replacement surgery: Secondary | ICD-10-CM | POA: Diagnosis not present

## 2022-08-28 DIAGNOSIS — Z96611 Presence of right artificial shoulder joint: Secondary | ICD-10-CM | POA: Diagnosis not present

## 2022-09-01 DIAGNOSIS — Z471 Aftercare following joint replacement surgery: Secondary | ICD-10-CM | POA: Diagnosis not present

## 2022-09-01 DIAGNOSIS — I110 Hypertensive heart disease with heart failure: Secondary | ICD-10-CM | POA: Diagnosis not present

## 2022-09-01 DIAGNOSIS — I480 Paroxysmal atrial fibrillation: Secondary | ICD-10-CM | POA: Diagnosis not present

## 2022-09-01 DIAGNOSIS — D649 Anemia, unspecified: Secondary | ICD-10-CM | POA: Diagnosis not present

## 2022-09-01 DIAGNOSIS — I5030 Unspecified diastolic (congestive) heart failure: Secondary | ICD-10-CM | POA: Diagnosis not present

## 2022-09-01 DIAGNOSIS — J4489 Other specified chronic obstructive pulmonary disease: Secondary | ICD-10-CM | POA: Diagnosis not present

## 2022-09-07 DIAGNOSIS — D649 Anemia, unspecified: Secondary | ICD-10-CM | POA: Diagnosis not present

## 2022-09-07 DIAGNOSIS — I480 Paroxysmal atrial fibrillation: Secondary | ICD-10-CM | POA: Diagnosis not present

## 2022-09-07 DIAGNOSIS — Z471 Aftercare following joint replacement surgery: Secondary | ICD-10-CM | POA: Diagnosis not present

## 2022-09-07 DIAGNOSIS — J4489 Other specified chronic obstructive pulmonary disease: Secondary | ICD-10-CM | POA: Diagnosis not present

## 2022-09-07 DIAGNOSIS — I5030 Unspecified diastolic (congestive) heart failure: Secondary | ICD-10-CM | POA: Diagnosis not present

## 2022-09-07 DIAGNOSIS — I110 Hypertensive heart disease with heart failure: Secondary | ICD-10-CM | POA: Diagnosis not present

## 2022-09-10 ENCOUNTER — Other Ambulatory Visit: Payer: Self-pay | Admitting: Cardiovascular Disease

## 2022-09-10 DIAGNOSIS — I480 Paroxysmal atrial fibrillation: Secondary | ICD-10-CM

## 2022-09-11 ENCOUNTER — Other Ambulatory Visit: Payer: Self-pay | Admitting: Internal Medicine

## 2022-09-11 MED ORDER — OXYCODONE HCL 20 MG PO TABS: 1.0000 | ORAL_TABLET | Freq: Two times a day (BID) | ORAL | 0 refills | Status: DC | PRN

## 2022-09-11 NOTE — Telephone Encounter (Signed)
Name of Medication: Oxycodone 20mg  Name of Pharmacy: [facility]. [place] and [address] Last [place] or Written Date and Quantity: 08-14-22 #60 Last Office Visit and Type: 06-19-22 Next Office Visit and Type: 09-18-22 Last Controlled Substance Agreement Date: 03-30-22 Last UDS: 03-30-22

## 2022-09-11 NOTE — Telephone Encounter (Signed)
Prescription Request  09/11/2022  LOV: 06/19/2022  What is the name of the medication or equipment? Oxycodone HCl 20 MG TABS   Have you contacted your pharmacy to request a refill? No   Which pharmacy would you like this sent to?  [id number] - [place], [place] - [address] AT [facility] [address] [facility] [address] [place] [address] Phone: [phone] Fax: [phone]     Patient notified that their request is being sent to the clinical staff for review and that they should receive a response within 2 business days.   Please advise at [facility] [phone]

## 2022-09-13 DIAGNOSIS — D649 Anemia, unspecified: Secondary | ICD-10-CM | POA: Diagnosis not present

## 2022-09-13 DIAGNOSIS — J4489 Other specified chronic obstructive pulmonary disease: Secondary | ICD-10-CM | POA: Diagnosis not present

## 2022-09-13 DIAGNOSIS — I480 Paroxysmal atrial fibrillation: Secondary | ICD-10-CM | POA: Diagnosis not present

## 2022-09-13 DIAGNOSIS — I5030 Unspecified diastolic (congestive) heart failure: Secondary | ICD-10-CM | POA: Diagnosis not present

## 2022-09-13 DIAGNOSIS — Z471 Aftercare following joint replacement surgery: Secondary | ICD-10-CM | POA: Diagnosis not present

## 2022-09-13 DIAGNOSIS — I110 Hypertensive heart disease with heart failure: Secondary | ICD-10-CM | POA: Diagnosis not present

## 2022-09-18 ENCOUNTER — Encounter: Payer: Self-pay | Admitting: Internal Medicine

## 2022-09-18 ENCOUNTER — Ambulatory Visit (INDEPENDENT_AMBULATORY_CARE_PROVIDER_SITE_OTHER): Payer: Medicare Other | Admitting: Internal Medicine

## 2022-09-18 VITALS — BP 122/84 | HR 74 | Temp 97.6°F | Ht 63.0 in | Wt 245.0 lb

## 2022-09-18 DIAGNOSIS — G894 Chronic pain syndrome: Secondary | ICD-10-CM

## 2022-09-18 DIAGNOSIS — F1120 Opioid dependence, uncomplicated: Secondary | ICD-10-CM | POA: Diagnosis not present

## 2022-09-18 DIAGNOSIS — F39 Unspecified mood [affective] disorder: Secondary | ICD-10-CM | POA: Diagnosis not present

## 2022-09-18 NOTE — Assessment & Plan Note (Signed)
Chronic dysthymia--pain/disability Prefers no meds

## 2022-09-18 NOTE — Assessment & Plan Note (Signed)
BMI still over 43 Not able to exercise

## 2022-09-18 NOTE — Assessment & Plan Note (Signed)
Multiple painful areas Takes the oxycodone 5-10mg  at a time--max 40mg  per day Ongoing disability

## 2022-09-18 NOTE — Assessment & Plan Note (Signed)
PDMP reviewed No concerns 

## 2022-09-18 NOTE — Progress Notes (Signed)
Subjective:    Patient ID: Tina Pacheco, female    DOB: 10/13/1950, 72 y.o.   MRN: [id number]  HPI Here for follow up of chronic pain and narcotic dependence  Still having pain in right shoulder since the surgery Hasn't been able to start PT due to post op problems--bleeding, etc  Ongoing pain issues Right shoulder now mostly stiff Left shoulder still painful--also needs surgery Back pain--has to hold on with both hands to walk (and that is hard with her shoulders) Left foot pain, right knee, etc also hurt  Splits up the 20mg  oxycodone---daughter trying to cut her back  Has own lift chair Had elevated toilet seat Still trouble transferring due to shoulders Daughter helps with showers. She can dress (loose clothes) [employer] food Aide 2 days a week--to cook ,etc  Daughter and family now living there  BMI still 43  Chronic mood issues/depression--mostly related to pain/disability  Current Outpatient Medications on File Prior to Visit  Medication Sig Dispense Refill   amiodarone (PACERONE) 200 MG tablet TAKE 1 TABLET(200 MG) BY MOUTH DAILY 90 tablet 0   amoxicillin (AMOXIL) 500 MG capsule Take 2,000 mg by mouth once. Prior to dental procedures     apixaban (ELIQUIS) 5 MG TABS tablet Take 1 tablet (5 mg total) by mouth 2 (two) times daily. 180 tablet 1   atorvastatin (LIPITOR) 40 MG tablet TAKE 1 TABLET(40 MG) BY MOUTH DAILY 90 tablet 3   Certolizumab Pegol (CIMZIA [place]) Inject into the skin. Patient states taking 1 x per month     ketoconazole (NIZORAL) 2 % cream Apply 1 Application topically as needed.     metolazone (ZAROXOLYN) 2.5 MG tablet TAKE 1 TABLET BY MOUTH DAILY AS NEEDED. MAY GIVE 1 TABLET AS NEEDED FOR WEIGHT GAIN OF 5 LB IN 1 WEEK 30 tablet 5   metoprolol succinate (TOPROL-XL) 50 MG 24 hr tablet TAKE 1 TABLET BY MOUTH EVERY DAY WITH OR IMMEDIATELY FOLLOWING A MEAL (Patient taking differently: Take 50 mg by mouth at bedtime. TAKE 1 TABLET BY MOUTH EVERY DAY WITH OR  IMMEDIATELY FOLLOWING A MEAL) 90 tablet 3   montelukast (SINGULAIR) 10 MG tablet TAKE 1 TABLET BY MOUTH AT BEDTIME 90 tablet 3   nitroGLYCERIN (NITROSTAT) 0.4 MG SL tablet Place 1 tablet (0.4 mg total) under the tongue every 5 (five) minutes as needed for chest pain. 20 tablet 12   nystatin (MYCOSTATIN/NYSTOP) powder Apply 1 application topically 3 (three) times daily as needed. 60 g 0   omeprazole (PRILOSEC) 40 MG capsule Take 1 capsule (40 mg total) by mouth at bedtime. 90 capsule 3   ondansetron (ZOFRAN-ODT) 4 MG disintegrating tablet DISSOLVE 1 TABLET(4 MG) ON THE TONGUE EVERY 8 HOURS AS NEEDED FOR NAUSEA OR VOMITING 30 tablet 1   Oxycodone HCl 20 MG TABS Take 1 tablet (20 mg total) by mouth 2 (two) times daily as needed. 60 tablet 0   polyethylene glycol (MIRALAX / GLYCOLAX) packet Take 17 g by mouth daily as needed for mild constipation. 14 each 0   Potassium Chloride 40 MEQ/15ML (20%) SOLN Take 7.5 mLs by mouth daily. 2250 mL 11   predniSONE (DELTASONE) 5 MG tablet Take 1 tablet (5 mg total) by mouth daily with breakfast. (Patient taking differently: Take 5 mg by mouth at bedtime.) 30 tablet 11   promethazine (PHENERGAN) 12.5 MG tablet Take 1 tablet (12.5 mg total) by mouth every 6 (six) hours as needed for nausea or vomiting. 30 tablet 1  spironolactone (ALDACTONE) 25 MG tablet TAKE 1 TABLET(25 MG) BY MOUTH DAILY 90 tablet 3   terazosin (HYTRIN) 10 MG capsule TAKE 1 CAPSULE(10 MG) BY MOUTH AT BEDTIME 90 capsule 3   torsemide (DEMADEX) 20 MG tablet Take 1 tablet (20 mg total) by mouth daily. (Patient taking differently: Take 40 mg by mouth daily.) 1 tablet 0   No current facility-administered medications on file prior to visit.    Allergies  Allergen Reactions   Fluoxetine Other (See Comments)    Headache, shaking, sleep issues Headache, shaking, sleep issues   Codeine     Nausea and vomiting/only when taking too much    Past Medical History:  Diagnosis Date   (HFpEF) heart  failure with preserved ejection fraction (HCC)    a.) TTE 08/10/2014: EF 55-60%, mild MAC, mild LAE, mild MR, PASP 38; b.) TTE 03/31/2019: EF 55-60%, mild LVH, mild-mod LAW, mod MAC, mild-mod AoV sclerosis, triv PR, G1DD; c.) TTE 05/29/2020: EF 60-65%, mild LVH, mod LAE, G1DD   Anemia    Aortic atherosclerosis (HCC)    Asthma    Basal cell carcinoma    CAD (coronary artery disease)    a.) LHC 05/24/2010 - nonobstructive CAD - med mgmt; b.) LHC 06/04/2020: 30% dLAD, 40% o-mLAD - med mgmt   Chronic, continuous use of opioids    Claustrophobia    COPD (chronic obstructive pulmonary disease) (HCC)    DDD (degenerative disc disease), lumbosacral    Diuretic-induced hypokalemia    Dyslipidemia    Fibromyalgia    Frequent falls    GERD (gastroesophageal reflux disease)    Gouty arthropathy    Hemihypertrophy    History of bilateral cataract extraction 2018   Hyperplastic colonic polyp 2003   Hypertension    Long term current use of amiodarone    Long term current use of anticoagulant    a.) apixaban   Long term current use of immunosuppressive drug    a.) certolizumab pegol  + predisone for RA diagnosis   MDD (major depressive disorder)    Migraines    Morbid obesity (HCC)    Nontraumatic complete tear of rotator cuff, left    OSA (obstructive sleep apnea)    a.) non-complaint with prescribed nocturnal PAP therapy   Osteoarthritis    PAF (paroxysmal atrial fibrillation) (HCC)    a.) CHA2DS2-VASc = 5 (age, sex, CHF, HTN, vascular disease history); b.) rate and rhythm maintained on oral amiodarone + metoprolol succinate; chronically anticoagulated with standard dose apixaban   Pneumonia    Prediabetes    Rheumatoid arthritis (HCC)    a.) on certolizumab pegol + predisone   Stage 3 chronic kidney disease (HCC)     Past Surgical History:  Procedure Laterality Date   ABDOMINAL HYSTERECTOMY     APPLICATION OF WOUND VAC Right 08/09/2017   Procedure: APPLICATION OF WOUND VAC;  Surgeon:  Recardo Evangelist, DPM;  Location: [facility];  Service: Podiatry;  Laterality: Right;   CARDIAC CATHETERIZATION  05/24/2010   nonobstructive CAD   CATARACT EXTRACTION W/ INTRAOCULAR LENS IMPLANT Right 06/12/2016   Dr. Mia Creek   CATARACT EXTRACTION W/ INTRAOCULAR LENS IMPLANT Left 06/26/2016   Dr. Mia Creek   CESAREAN SECTION     CHOLECYSTECTOMY     COLONOSCOPY  06/12/2011   Procedure: COLONOSCOPY;  Surgeon: Charna Elizabeth, MD;  Location: [facility];  Service: Endoscopy;  Laterality: N/A;   COLONOSCOPY N/A 03/17/2013   Procedure: COLONOSCOPY;  Surgeon: Charna Elizabeth, MD;  Location: [facility];  Service: Endoscopy;  Laterality: N/A;   FOOT ARTHRODESIS Right 07/13/2017   Procedure: ARTHRODESIS FOOT-MULTI.FUSIONS (6 JOINTS);  Surgeon: Recardo Evangelist, DPM;  Location: [facility];  Service: Podiatry;  Laterality: Right;   IRRIGATION AND DEBRIDEMENT FOOT Right 08/09/2017   Procedure: IRRIGATION AND DEBRIDEMENT FOOT;  Surgeon: Recardo Evangelist, DPM;  Location: [facility];  Service: Podiatry;  Laterality: Right;   KNEE ARTHROSCOPY Bilateral    bilateral   LEFT HEART CATH AND CORONARY ANGIOGRAPHY N/A 06/04/2020   Procedure: LEFT HEART CATH AND CORONARY ANGIOGRAPHY;  Surgeon: Iran Ouch, MD;  Location: [facility];  Service: Cardiovascular;  Laterality: N/A;   REVERSE SHOULDER ARTHROPLASTY Right 11/16/2020   Procedure: REVERSE SHOULDER ARTHROPLASTY;  Surgeon: Christena Flake, MD;  Location: [facility];  Service: Orthopedics;  Laterality: Right;   SHOULDER CLOSED REDUCTION Right 11/11/2020   Procedure: CLOSED REDUCTION SHOULDER;  Surgeon: Christena Flake, MD;  Location: [facility];  Service: Orthopedics;  Laterality: Right;   SHOULDER INJECTION Right 11/11/2020   Procedure: SHOULDER INJECTION;  Surgeon: Christena Flake, MD;  Location: [facility];  Service: Orthopedics;  Laterality: Right;   SINUSOTOMY     TOOTH EXTRACTION  12/2016   TOTAL SHOULDER REVISION Right 06/27/2022   Procedure: TOTAL  SHOULDER REVISION;  Surgeon: Christena Flake, MD;  Location: [facility];  Service: Orthopedics;  Laterality: Right;    Family History  Problem Relation Age of Onset   Tuberculosis Mother    Parkinsonism Mother    Emphysema Father        smoked   Arthritis Father    Cancer Sister    Diabetes type II Sister    Breast cancer Sister    Tuberculosis Sister    Breast cancer Maternal Aunt    Colon cancer Neg Hx    Esophageal cancer Neg Hx    Pancreatic cancer Neg Hx    Stomach cancer Neg Hx     Social History   Socioeconomic History   Marital status: Married    Spouse name: Rexford Maus   Number of children: 1   Years of education: Not on file   Highest education level: Bachelor's degree (e.g., BA, AB, BS)  Occupational History   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  Tobacco Use   Smoking status: Never    Passive exposure: Past   Smokeless tobacco: Never  Vaping Use   Vaping status: Never Used  Substance and Sexual Activity   Alcohol use: No   Drug use: No   Sexual activity: Not Currently  Other Topics Concern   Not on file  Social History Narrative   No living will   Daughter should be decision maker   Would accept resuscitation attempts--but no prolonged ventilator or tube feeds   Social Determinants of Health   Financial Resource Strain: Low Risk  (06/19/2022)   Overall Financial Resource Strain (CARDIA)    Difficulty of Paying Living Expenses: Not hard at all  Food Insecurity: No Food Insecurity (06/27/2022)   Hunger Vital Sign    Worried About Running Out of Food in the Last Year: Never true    Ran Out of Food in the Last Year: Never true  Transportation Needs: No Transportation Needs (06/27/2022)   PRAPARE - Administrator, Civil Service (Medical): No    Lack of Transportation (Non-Medical): No  Physical Activity: Unknown (06/19/2022)   Exercise Vital Sign    Days of  Exercise per Week: 0 days    Minutes of Exercise per  Session: Not on file  Stress: No Stress Concern Present (06/19/2022)   [employer] of Occupational Health - Occupational Stress Questionnaire    Feeling of Stress : Only a little  Social Connections: Moderately Isolated (06/19/2022)   Social Connection and Isolation Panel [NHANES]    Frequency of Communication with Friends and Family: More than three times a week    Frequency of Social Gatherings with Friends and Family: Three times a week    Attends Religious Services: Never    Active Member of Clubs or Organizations: No    Attends Banker Meetings: Not on file    Marital Status: Married  Catering manager Violence: Not At Risk (06/27/2022)   Humiliation, Afraid, Rape, and Kick questionnaire    Fear of Current or Ex-Partner: No    Emotionally Abused: No    Physically Abused: No    Sexually Abused: No   Review of Systems Not sleeping well--but naps in the day Eating is not great--prefers just to drink Teeth in bad shape---may need extractions/dentures    Objective:   Physical Exam Constitutional:      Appearance: Normal appearance.  Neurological:     Mental Status: She is alert.  Psychiatric:        Mood and Affect: Mood normal.        Behavior: Behavior normal.            Assessment & Plan:

## 2022-09-19 DIAGNOSIS — I480 Paroxysmal atrial fibrillation: Secondary | ICD-10-CM | POA: Diagnosis not present

## 2022-09-19 DIAGNOSIS — I110 Hypertensive heart disease with heart failure: Secondary | ICD-10-CM | POA: Diagnosis not present

## 2022-09-19 DIAGNOSIS — Z471 Aftercare following joint replacement surgery: Secondary | ICD-10-CM | POA: Diagnosis not present

## 2022-09-19 DIAGNOSIS — I5030 Unspecified diastolic (congestive) heart failure: Secondary | ICD-10-CM | POA: Diagnosis not present

## 2022-09-19 DIAGNOSIS — J4489 Other specified chronic obstructive pulmonary disease: Secondary | ICD-10-CM | POA: Diagnosis not present

## 2022-09-19 DIAGNOSIS — D649 Anemia, unspecified: Secondary | ICD-10-CM | POA: Diagnosis not present

## 2022-09-19 DIAGNOSIS — M0579 Rheumatoid arthritis with rheumatoid factor of multiple sites without organ or systems involvement: Secondary | ICD-10-CM | POA: Diagnosis not present

## 2022-09-22 DIAGNOSIS — S42121G Displaced fracture of acromial process, right shoulder, subsequent encounter for fracture with delayed healing: Secondary | ICD-10-CM | POA: Diagnosis not present

## 2022-09-22 DIAGNOSIS — Z96611 Presence of right artificial shoulder joint: Secondary | ICD-10-CM | POA: Diagnosis not present

## 2022-09-25 DIAGNOSIS — D649 Anemia, unspecified: Secondary | ICD-10-CM | POA: Diagnosis not present

## 2022-09-25 DIAGNOSIS — I110 Hypertensive heart disease with heart failure: Secondary | ICD-10-CM | POA: Diagnosis not present

## 2022-09-25 DIAGNOSIS — J4489 Other specified chronic obstructive pulmonary disease: Secondary | ICD-10-CM | POA: Diagnosis not present

## 2022-09-25 DIAGNOSIS — I480 Paroxysmal atrial fibrillation: Secondary | ICD-10-CM | POA: Diagnosis not present

## 2022-09-25 DIAGNOSIS — I5030 Unspecified diastolic (congestive) heart failure: Secondary | ICD-10-CM | POA: Diagnosis not present

## 2022-09-25 DIAGNOSIS — Z471 Aftercare following joint replacement surgery: Secondary | ICD-10-CM | POA: Diagnosis not present

## 2022-09-27 DIAGNOSIS — M1990 Unspecified osteoarthritis, unspecified site: Secondary | ICD-10-CM | POA: Diagnosis not present

## 2022-09-27 DIAGNOSIS — M109 Gout, unspecified: Secondary | ICD-10-CM | POA: Diagnosis not present

## 2022-09-27 DIAGNOSIS — Z86010 Personal history of colonic polyps: Secondary | ICD-10-CM | POA: Diagnosis not present

## 2022-09-27 DIAGNOSIS — E785 Hyperlipidemia, unspecified: Secondary | ICD-10-CM | POA: Diagnosis not present

## 2022-09-27 DIAGNOSIS — I480 Paroxysmal atrial fibrillation: Secondary | ICD-10-CM | POA: Diagnosis not present

## 2022-09-27 DIAGNOSIS — G4733 Obstructive sleep apnea (adult) (pediatric): Secondary | ICD-10-CM | POA: Diagnosis not present

## 2022-09-27 DIAGNOSIS — Z96611 Presence of right artificial shoulder joint: Secondary | ICD-10-CM | POA: Diagnosis not present

## 2022-09-27 DIAGNOSIS — Z9181 History of falling: Secondary | ICD-10-CM | POA: Diagnosis not present

## 2022-09-27 DIAGNOSIS — M5136 Other intervertebral disc degeneration, lumbar region: Secondary | ICD-10-CM | POA: Diagnosis not present

## 2022-09-27 DIAGNOSIS — M069 Rheumatoid arthritis, unspecified: Secondary | ICD-10-CM | POA: Diagnosis not present

## 2022-09-27 DIAGNOSIS — J4489 Other specified chronic obstructive pulmonary disease: Secondary | ICD-10-CM | POA: Diagnosis not present

## 2022-09-27 DIAGNOSIS — H269 Unspecified cataract: Secondary | ICD-10-CM | POA: Diagnosis not present

## 2022-09-27 DIAGNOSIS — M797 Fibromyalgia: Secondary | ICD-10-CM | POA: Diagnosis not present

## 2022-09-27 DIAGNOSIS — I350 Nonrheumatic aortic (valve) stenosis: Secondary | ICD-10-CM | POA: Diagnosis not present

## 2022-09-27 DIAGNOSIS — Z85828 Personal history of other malignant neoplasm of skin: Secondary | ICD-10-CM | POA: Diagnosis not present

## 2022-09-27 DIAGNOSIS — Z471 Aftercare following joint replacement surgery: Secondary | ICD-10-CM | POA: Diagnosis not present

## 2022-09-27 DIAGNOSIS — G43909 Migraine, unspecified, not intractable, without status migrainosus: Secondary | ICD-10-CM | POA: Diagnosis not present

## 2022-09-27 DIAGNOSIS — Z7952 Long term (current) use of systemic steroids: Secondary | ICD-10-CM | POA: Diagnosis not present

## 2022-09-27 DIAGNOSIS — I110 Hypertensive heart disease with heart failure: Secondary | ICD-10-CM | POA: Diagnosis not present

## 2022-09-27 DIAGNOSIS — G629 Polyneuropathy, unspecified: Secondary | ICD-10-CM | POA: Diagnosis not present

## 2022-09-27 DIAGNOSIS — D649 Anemia, unspecified: Secondary | ICD-10-CM | POA: Diagnosis not present

## 2022-09-27 DIAGNOSIS — I5030 Unspecified diastolic (congestive) heart failure: Secondary | ICD-10-CM | POA: Diagnosis not present

## 2022-09-28 DIAGNOSIS — J4489 Other specified chronic obstructive pulmonary disease: Secondary | ICD-10-CM | POA: Diagnosis not present

## 2022-09-28 DIAGNOSIS — I5030 Unspecified diastolic (congestive) heart failure: Secondary | ICD-10-CM | POA: Diagnosis not present

## 2022-09-28 DIAGNOSIS — Z471 Aftercare following joint replacement surgery: Secondary | ICD-10-CM | POA: Diagnosis not present

## 2022-09-28 DIAGNOSIS — I480 Paroxysmal atrial fibrillation: Secondary | ICD-10-CM | POA: Diagnosis not present

## 2022-09-28 DIAGNOSIS — I110 Hypertensive heart disease with heart failure: Secondary | ICD-10-CM | POA: Diagnosis not present

## 2022-09-28 DIAGNOSIS — D649 Anemia, unspecified: Secondary | ICD-10-CM | POA: Diagnosis not present

## 2022-10-02 ENCOUNTER — Encounter
Admission: EM | Disposition: A | Discharge status: Skilled Nursing Facility | Payer: Self-pay | Source: Home / Self Care | Attending: Internal Medicine

## 2022-10-02 ENCOUNTER — Emergency Department: Payer: Medicare Other | Admitting: Anesthesiology

## 2022-10-02 ENCOUNTER — Inpatient Hospital Stay
Admission: EM | Admit: 2022-10-02 | Discharge: 2022-10-06 | DRG: 483 | Disposition: A | Discharge status: Skilled Nursing Facility | Payer: Medicare Other | Attending: Internal Medicine | Admitting: Internal Medicine

## 2022-10-02 ENCOUNTER — Inpatient Hospital Stay: Payer: Medicare Other

## 2022-10-02 ENCOUNTER — Other Ambulatory Visit: Payer: Self-pay

## 2022-10-02 ENCOUNTER — Emergency Department: Payer: Medicare Other

## 2022-10-02 ENCOUNTER — Encounter: Payer: Self-pay | Admitting: Emergency Medicine

## 2022-10-02 DIAGNOSIS — G2581 Restless legs syndrome: Secondary | ICD-10-CM | POA: Diagnosis present

## 2022-10-02 DIAGNOSIS — Z7952 Long term (current) use of systemic steroids: Secondary | ICD-10-CM

## 2022-10-02 DIAGNOSIS — I2510 Atherosclerotic heart disease of native coronary artery without angina pectoris: Secondary | ICD-10-CM | POA: Diagnosis not present

## 2022-10-02 DIAGNOSIS — I5032 Chronic diastolic (congestive) heart failure: Secondary | ICD-10-CM | POA: Diagnosis not present

## 2022-10-02 DIAGNOSIS — E782 Mixed hyperlipidemia: Secondary | ICD-10-CM | POA: Diagnosis present

## 2022-10-02 DIAGNOSIS — Z885 Allergy status to narcotic agent status: Secondary | ICD-10-CM

## 2022-10-02 DIAGNOSIS — J4489 Other specified chronic obstructive pulmonary disease: Secondary | ICD-10-CM | POA: Diagnosis not present

## 2022-10-02 DIAGNOSIS — W010XXA Fall on same level from slipping, tripping and stumbling without subsequent striking against object, initial encounter: Secondary | ICD-10-CM | POA: Diagnosis present

## 2022-10-02 DIAGNOSIS — I480 Paroxysmal atrial fibrillation: Secondary | ICD-10-CM | POA: Diagnosis not present

## 2022-10-02 DIAGNOSIS — I10 Essential (primary) hypertension: Secondary | ICD-10-CM | POA: Diagnosis not present

## 2022-10-02 DIAGNOSIS — F40240 Claustrophobia: Secondary | ICD-10-CM | POA: Diagnosis present

## 2022-10-02 DIAGNOSIS — M069 Rheumatoid arthritis, unspecified: Secondary | ICD-10-CM | POA: Diagnosis present

## 2022-10-02 DIAGNOSIS — R296 Repeated falls: Secondary | ICD-10-CM | POA: Diagnosis not present

## 2022-10-02 DIAGNOSIS — M25512 Pain in left shoulder: Secondary | ICD-10-CM | POA: Diagnosis not present

## 2022-10-02 DIAGNOSIS — M797 Fibromyalgia: Secondary | ICD-10-CM | POA: Diagnosis present

## 2022-10-02 DIAGNOSIS — Z79899 Other long term (current) drug therapy: Secondary | ICD-10-CM

## 2022-10-02 DIAGNOSIS — Z6841 Body Mass Index (BMI) 40.0 and over, adult: Secondary | ICD-10-CM | POA: Diagnosis not present

## 2022-10-02 DIAGNOSIS — Z993 Dependence on wheelchair: Secondary | ICD-10-CM

## 2022-10-02 DIAGNOSIS — M25519 Pain in unspecified shoulder: Secondary | ICD-10-CM | POA: Diagnosis not present

## 2022-10-02 DIAGNOSIS — I130 Hypertensive heart and chronic kidney disease with heart failure and stage 1 through stage 4 chronic kidney disease, or unspecified chronic kidney disease: Secondary | ICD-10-CM | POA: Diagnosis present

## 2022-10-02 DIAGNOSIS — R7303 Prediabetes: Secondary | ICD-10-CM | POA: Diagnosis present

## 2022-10-02 DIAGNOSIS — N1832 Chronic kidney disease, stage 3b: Secondary | ICD-10-CM | POA: Diagnosis not present

## 2022-10-02 DIAGNOSIS — S43005A Unspecified dislocation of left shoulder joint, initial encounter: Secondary | ICD-10-CM | POA: Diagnosis not present

## 2022-10-02 DIAGNOSIS — G4700 Insomnia, unspecified: Secondary | ICD-10-CM | POA: Diagnosis present

## 2022-10-02 DIAGNOSIS — Z9841 Cataract extraction status, right eye: Secondary | ICD-10-CM

## 2022-10-02 DIAGNOSIS — M75122 Complete rotator cuff tear or rupture of left shoulder, not specified as traumatic: Secondary | ICD-10-CM | POA: Diagnosis not present

## 2022-10-02 DIAGNOSIS — Z9181 History of falling: Secondary | ICD-10-CM | POA: Diagnosis not present

## 2022-10-02 DIAGNOSIS — R41841 Cognitive communication deficit: Secondary | ICD-10-CM | POA: Diagnosis not present

## 2022-10-02 DIAGNOSIS — M549 Dorsalgia, unspecified: Secondary | ICD-10-CM | POA: Diagnosis not present

## 2022-10-02 DIAGNOSIS — J449 Chronic obstructive pulmonary disease, unspecified: Secondary | ICD-10-CM | POA: Diagnosis present

## 2022-10-02 DIAGNOSIS — M75102 Unspecified rotator cuff tear or rupture of left shoulder, not specified as traumatic: Secondary | ICD-10-CM | POA: Diagnosis present

## 2022-10-02 DIAGNOSIS — M25312 Other instability, left shoulder: Secondary | ICD-10-CM | POA: Diagnosis present

## 2022-10-02 DIAGNOSIS — Z85828 Personal history of other malignant neoplasm of skin: Secondary | ICD-10-CM

## 2022-10-02 DIAGNOSIS — I700 Atherosclerosis of aorta: Secondary | ICD-10-CM | POA: Diagnosis present

## 2022-10-02 DIAGNOSIS — W19XXXA Unspecified fall, initial encounter: Secondary | ICD-10-CM | POA: Diagnosis not present

## 2022-10-02 DIAGNOSIS — Z961 Presence of intraocular lens: Secondary | ICD-10-CM | POA: Diagnosis present

## 2022-10-02 DIAGNOSIS — Z7960 Long term (current) use of unspecified immunomodulators and immunosuppressants: Secondary | ICD-10-CM

## 2022-10-02 DIAGNOSIS — Z825 Family history of asthma and other chronic lower respiratory diseases: Secondary | ICD-10-CM

## 2022-10-02 DIAGNOSIS — M6259 Muscle wasting and atrophy, not elsewhere classified, multiple sites: Secondary | ICD-10-CM | POA: Diagnosis not present

## 2022-10-02 DIAGNOSIS — D84821 Immunodeficiency due to drugs: Secondary | ICD-10-CM | POA: Diagnosis present

## 2022-10-02 DIAGNOSIS — M12812 Other specific arthropathies, not elsewhere classified, left shoulder: Secondary | ICD-10-CM | POA: Diagnosis not present

## 2022-10-02 DIAGNOSIS — Z7901 Long term (current) use of anticoagulants: Secondary | ICD-10-CM

## 2022-10-02 DIAGNOSIS — Z743 Need for continuous supervision: Secondary | ICD-10-CM | POA: Diagnosis not present

## 2022-10-02 DIAGNOSIS — M6281 Muscle weakness (generalized): Secondary | ICD-10-CM | POA: Diagnosis not present

## 2022-10-02 DIAGNOSIS — S4292XA Fracture of left shoulder girdle, part unspecified, initial encounter for closed fracture: Secondary | ICD-10-CM | POA: Diagnosis present

## 2022-10-02 DIAGNOSIS — G4730 Sleep apnea, unspecified: Secondary | ICD-10-CM | POA: Diagnosis not present

## 2022-10-02 DIAGNOSIS — R2681 Unsteadiness on feet: Secondary | ICD-10-CM | POA: Diagnosis not present

## 2022-10-02 DIAGNOSIS — S43015A Anterior dislocation of left humerus, initial encounter: Secondary | ICD-10-CM | POA: Diagnosis present

## 2022-10-02 DIAGNOSIS — G894 Chronic pain syndrome: Secondary | ICD-10-CM | POA: Diagnosis not present

## 2022-10-02 DIAGNOSIS — Z79891 Long term (current) use of opiate analgesic: Secondary | ICD-10-CM

## 2022-10-02 DIAGNOSIS — Z833 Family history of diabetes mellitus: Secondary | ICD-10-CM

## 2022-10-02 DIAGNOSIS — R262 Difficulty in walking, not elsewhere classified: Secondary | ICD-10-CM | POA: Diagnosis not present

## 2022-10-02 DIAGNOSIS — M353 Polymyalgia rheumatica: Secondary | ICD-10-CM | POA: Diagnosis present

## 2022-10-02 DIAGNOSIS — R279 Unspecified lack of coordination: Secondary | ICD-10-CM | POA: Diagnosis not present

## 2022-10-02 DIAGNOSIS — Z8673 Personal history of transient ischemic attack (TIA), and cerebral infarction without residual deficits: Secondary | ICD-10-CM

## 2022-10-02 DIAGNOSIS — S43432A Superior glenoid labrum lesion of left shoulder, initial encounter: Secondary | ICD-10-CM | POA: Diagnosis present

## 2022-10-02 DIAGNOSIS — I25119 Atherosclerotic heart disease of native coronary artery with unspecified angina pectoris: Secondary | ICD-10-CM | POA: Diagnosis not present

## 2022-10-02 DIAGNOSIS — Z23 Encounter for immunization: Secondary | ICD-10-CM | POA: Diagnosis not present

## 2022-10-02 DIAGNOSIS — R1312 Dysphagia, oropharyngeal phase: Secondary | ICD-10-CM | POA: Diagnosis not present

## 2022-10-02 DIAGNOSIS — Z96612 Presence of left artificial shoulder joint: Secondary | ICD-10-CM | POA: Diagnosis not present

## 2022-10-02 DIAGNOSIS — M19012 Primary osteoarthritis, left shoulder: Secondary | ICD-10-CM | POA: Diagnosis not present

## 2022-10-02 DIAGNOSIS — M5137 Other intervertebral disc degeneration, lumbosacral region: Secondary | ICD-10-CM | POA: Diagnosis present

## 2022-10-02 DIAGNOSIS — Z96611 Presence of right artificial shoulder joint: Secondary | ICD-10-CM | POA: Diagnosis present

## 2022-10-02 DIAGNOSIS — Z888 Allergy status to other drugs, medicaments and biological substances status: Secondary | ICD-10-CM

## 2022-10-02 DIAGNOSIS — Z9842 Cataract extraction status, left eye: Secondary | ICD-10-CM

## 2022-10-02 DIAGNOSIS — M7582 Other shoulder lesions, left shoulder: Secondary | ICD-10-CM | POA: Diagnosis not present

## 2022-10-02 DIAGNOSIS — M25412 Effusion, left shoulder: Secondary | ICD-10-CM | POA: Diagnosis not present

## 2022-10-02 DIAGNOSIS — G4733 Obstructive sleep apnea (adult) (pediatric): Secondary | ICD-10-CM | POA: Diagnosis present

## 2022-10-02 DIAGNOSIS — K219 Gastro-esophageal reflux disease without esophagitis: Secondary | ICD-10-CM | POA: Diagnosis present

## 2022-10-02 DIAGNOSIS — Z90710 Acquired absence of both cervix and uterus: Secondary | ICD-10-CM

## 2022-10-02 DIAGNOSIS — R278 Other lack of coordination: Secondary | ICD-10-CM | POA: Diagnosis not present

## 2022-10-02 HISTORY — DX: Nausea with vomiting, unspecified: Z98.890

## 2022-10-02 HISTORY — PX: SHOULDER CLOSED REDUCTION: SHX1051

## 2022-10-02 LAB — BASIC METABOLIC PANEL
Anion gap: 10 (ref 5–15)
BUN: 35 mg/dL — ABNORMAL HIGH (ref 8–23)
CO2: 25 mmol/L (ref 22–32)
Calcium: 9.8 mg/dL (ref 8.9–10.3)
Chloride: 101 mmol/L (ref 98–111)
Creatinine, Ser: 1.76 mg/dL — ABNORMAL HIGH (ref 0.44–1.00)
GFR, Estimated: 30 mL/min — ABNORMAL LOW (ref >60)
Glucose, Bld: 93 mg/dL (ref 70–99)
Potassium: 4 mmol/L (ref 3.5–5.1)
Sodium: 136 mmol/L (ref 135–145)

## 2022-10-02 LAB — CBC WITH DIFFERENTIAL/PLATELET
Abs Immature Granulocytes: 0.02 10*3/uL (ref 0.00–0.07)
Basophils Absolute: 0 10*3/uL (ref 0.0–0.1)
Basophils Relative: 1 %
Eosinophils Absolute: 0.1 10*3/uL (ref 0.0–0.5)
Eosinophils Relative: 1 %
HCT: 32.8 % — ABNORMAL LOW (ref 36.0–46.0)
Hemoglobin: 10.2 g/dL — ABNORMAL LOW (ref 12.0–15.0)
Immature Granulocytes: 0 %
Lymphocytes Relative: 14 %
Lymphs Abs: 1.1 10*3/uL (ref 0.7–4.0)
MCH: 27.9 pg (ref 26.0–34.0)
MCHC: 31.1 g/dL (ref 30.0–36.0)
MCV: 89.6 fL (ref 80.0–100.0)
Monocytes Absolute: 0.6 10*3/uL (ref 0.1–1.0)
Monocytes Relative: 8 %
Neutro Abs: 5.9 10*3/uL (ref 1.7–7.7)
Neutrophils Relative %: 76 %
Platelets: 240 10*3/uL (ref 150–400)
RBC: 3.66 MIL/uL — ABNORMAL LOW (ref 3.87–5.11)
RDW: 16.6 % — ABNORMAL HIGH (ref 11.5–15.5)
WBC: 7.7 10*3/uL (ref 4.0–10.5)
nRBC: 0 % (ref 0.0–0.2)

## 2022-10-02 SURGERY — CLOSED REDUCTION, SHOULDER
Anesthesia: General | Site: Shoulder | Laterality: Left

## 2022-10-02 MED ORDER — FENTANYL CITRATE (PF) 100 MCG/2ML IJ SOLN
25.0000 ug | INTRAMUSCULAR | Status: DC | PRN
  Administered 2022-10-02 (×4): 25 ug via INTRAVENOUS

## 2022-10-02 MED ORDER — ACETAMINOPHEN 325 MG PO TABS: 325.0000 mg | ORAL_TABLET | Freq: Four times a day (QID) | ORAL | Status: DC | PRN

## 2022-10-02 MED ORDER — HYDROMORPHONE HCL 1 MG/ML IJ SOLN
INTRAMUSCULAR | Status: AC
  Filled 2022-10-02: qty 1

## 2022-10-02 MED ORDER — ONDANSETRON HCL 4 MG/2ML IJ SOLN: 4.0000 mg | Freq: Four times a day (QID) | INTRAMUSCULAR | Status: DC | PRN

## 2022-10-02 MED ORDER — ONDANSETRON HCL 4 MG/2ML IJ SOLN
INTRAMUSCULAR | Status: DC | PRN
  Administered 2022-10-02: 4 mg via INTRAVENOUS

## 2022-10-02 MED ORDER — ONDANSETRON HCL 4 MG/2ML IJ SOLN: 4.0000 mg | Freq: Once | INTRAMUSCULAR | Status: DC | PRN

## 2022-10-02 MED ORDER — OXYCODONE HCL 5 MG PO TABS
ORAL_TABLET | ORAL | Status: AC
  Filled 2022-10-02: qty 1

## 2022-10-02 MED ORDER — PROPOFOL 500 MG/50ML IV EMUL
INTRAVENOUS | Status: DC | PRN
  Administered 2022-10-02: 30 mg via INTRAVENOUS
  Administered 2022-10-02: 50 mg via INTRAVENOUS

## 2022-10-02 MED ORDER — KETOROLAC TROMETHAMINE 30 MG/ML IJ SOLN
INTRAMUSCULAR | Status: DC | PRN
  Administered 2022-10-02: 15 mg via INTRAVENOUS

## 2022-10-02 MED ORDER — ACETAMINOPHEN 10 MG/ML IV SOLN
INTRAVENOUS | Status: AC
  Filled 2022-10-02: qty 100

## 2022-10-02 MED ORDER — OXYCODONE-ACETAMINOPHEN 5-325 MG PO TABS
1.0000 | ORAL_TABLET | Freq: Four times a day (QID) | ORAL | Status: DC | PRN
  Filled 2022-10-02: qty 1

## 2022-10-02 MED ORDER — SENNOSIDES-DOCUSATE SODIUM 8.6-50 MG PO TABS
1.0000 | ORAL_TABLET | Freq: Every evening | ORAL | Status: DC | PRN
  Administered 2022-10-04: 1 via ORAL
  Filled 2022-10-02 (×3): qty 1

## 2022-10-02 MED ORDER — OXYCODONE HCL 5 MG PO TABS: 5.0000 mg | ORAL_TABLET | Freq: Four times a day (QID) | ORAL | Status: DC | PRN

## 2022-10-02 MED ORDER — MELATONIN 5 MG PO TABS: 5.0000 mg | ORAL_TABLET | Freq: Every evening | ORAL | Status: DC | PRN

## 2022-10-02 MED ORDER — SUCCINYLCHOLINE CHLORIDE 200 MG/10ML IV SOSY
PREFILLED_SYRINGE | INTRAVENOUS | Status: DC | PRN
  Administered 2022-10-02: 100 mg via INTRAVENOUS

## 2022-10-02 MED ORDER — ACETAMINOPHEN 650 MG RE SUPP: 650.0000 mg | Freq: Four times a day (QID) | RECTAL | Status: DC | PRN

## 2022-10-02 MED ORDER — OXYCODONE-ACETAMINOPHEN 5-325 MG PO TABS: 1.0000 | ORAL_TABLET | Freq: Four times a day (QID) | ORAL | Status: DC | PRN

## 2022-10-02 MED ORDER — OXYCODONE HCL 5 MG PO TABS
5.0000 mg | ORAL_TABLET | Freq: Once | ORAL | Status: AC | PRN
  Administered 2022-10-02: 5 mg via ORAL

## 2022-10-02 MED ORDER — KETAMINE HCL 50 MG/5ML IJ SOSY
1.0000 mg/kg | PREFILLED_SYRINGE | Freq: Once | INTRAMUSCULAR | Status: AC
  Administered 2022-10-02: 85 mg via INTRAVENOUS
  Filled 2022-10-02: qty 15

## 2022-10-02 MED ORDER — OXYCODONE HCL 5 MG/5ML PO SOLN: 5.0000 mg | Freq: Once | ORAL | Status: AC | PRN

## 2022-10-02 MED ORDER — PROPOFOL 10 MG/ML IV BOLUS
INTRAVENOUS | Status: AC
  Filled 2022-10-02: qty 40

## 2022-10-02 MED ORDER — HYDROMORPHONE HCL 1 MG/ML IJ SOLN
0.5000 mg | INTRAMUSCULAR | Status: AC | PRN
  Administered 2022-10-02 (×4): 0.5 mg via INTRAVENOUS

## 2022-10-02 MED ORDER — IPRATROPIUM-ALBUTEROL 0.5-2.5 (3) MG/3ML IN SOLN: 3.0000 mL | Freq: Four times a day (QID) | RESPIRATORY_TRACT | Status: AC | PRN

## 2022-10-02 MED ORDER — ONDANSETRON HCL 4 MG/2ML IJ SOLN
4.0000 mg | Freq: Once | INTRAMUSCULAR | Status: AC
  Administered 2022-10-02: 4 mg via INTRAVENOUS
  Filled 2022-10-02: qty 2

## 2022-10-02 MED ORDER — OXYCODONE HCL 5 MG PO TABS
5.0000 mg | ORAL_TABLET | Freq: Once | ORAL | Status: AC
  Administered 2022-10-02: 5 mg via ORAL

## 2022-10-02 MED ORDER — MORPHINE SULFATE (PF) 4 MG/ML IV SOLN
4.0000 mg | INTRAVENOUS | Status: DC | PRN
  Administered 2022-10-03: 4 mg via INTRAVENOUS
  Filled 2022-10-02: qty 1

## 2022-10-02 MED ORDER — MORPHINE SULFATE (PF) 2 MG/ML IV SOLN
0.5000 mg | INTRAVENOUS | Status: DC | PRN
  Administered 2022-10-03: 1 mg via INTRAVENOUS
  Filled 2022-10-02: qty 1

## 2022-10-02 MED ORDER — KETOROLAC TROMETHAMINE 30 MG/ML IJ SOLN
INTRAMUSCULAR | Status: AC
  Filled 2022-10-02: qty 1

## 2022-10-02 MED ORDER — ONDANSETRON HCL 4 MG PO TABS: 4.0000 mg | ORAL_TABLET | Freq: Four times a day (QID) | ORAL | Status: DC | PRN

## 2022-10-02 MED ORDER — SODIUM CHLORIDE 0.9 % IV SOLN
INTRAVENOUS | Status: DC | PRN
Start: 2022-10-02 — End: 2022-10-02

## 2022-10-02 MED ORDER — BENZONATATE 100 MG PO CAPS
100.0000 mg | ORAL_CAPSULE | Freq: Two times a day (BID) | ORAL | Status: DC | PRN
  Administered 2022-10-05: 100 mg via ORAL
  Filled 2022-10-02: qty 1

## 2022-10-02 MED ORDER — POTASSIUM CHLORIDE IN NACL 20-0.9 MEQ/L-% IV SOLN
INTRAVENOUS | Status: DC
  Filled 2022-10-02 (×6): qty 1000

## 2022-10-02 MED ORDER — ACETAMINOPHEN 10 MG/ML IV SOLN: 1000.0000 mg | Freq: Once | INTRAVENOUS | Status: DC | PRN

## 2022-10-02 MED ORDER — ONDANSETRON HCL 4 MG/2ML IJ SOLN
INTRAMUSCULAR | Status: AC
  Filled 2022-10-02: qty 2

## 2022-10-02 MED ORDER — METOCLOPRAMIDE HCL 5 MG/ML IJ SOLN: 5.0000 mg | Freq: Three times a day (TID) | INTRAMUSCULAR | Status: DC | PRN

## 2022-10-02 MED ORDER — MUPIROCIN 2 % EX OINT
1.0000 | TOPICAL_OINTMENT | Freq: Two times a day (BID) | CUTANEOUS | Status: DC
  Filled 2022-10-02: qty 22

## 2022-10-02 MED ORDER — PROPOFOL 10 MG/ML IV BOLUS
0.5000 mg/kg | Freq: Once | INTRAVENOUS | Status: AC
  Administered 2022-10-02: 120 mg via INTRAVENOUS
  Filled 2022-10-02: qty 20

## 2022-10-02 MED ORDER — FENTANYL CITRATE (PF) 100 MCG/2ML IJ SOLN
INTRAMUSCULAR | Status: DC | PRN
  Administered 2022-10-02 (×2): 25 ug via INTRAVENOUS
  Administered 2022-10-02: 50 ug via INTRAVENOUS

## 2022-10-02 MED ORDER — FENTANYL CITRATE (PF) 100 MCG/2ML IJ SOLN
INTRAMUSCULAR | Status: AC
  Filled 2022-10-02: qty 2

## 2022-10-02 MED ORDER — FENTANYL CITRATE PF 50 MCG/ML IJ SOSY
50.0000 ug | PREFILLED_SYRINGE | INTRAMUSCULAR | Status: AC | PRN
  Administered 2022-10-03: 50 ug via INTRAVENOUS
  Filled 2022-10-02: qty 1

## 2022-10-02 MED ORDER — LIDOCAINE HCL 1 % IJ SOLN
INTRAMUSCULAR | Status: AC
  Filled 2022-10-02: qty 10

## 2022-10-02 MED ORDER — LIDOCAINE HCL (PF) 1 % IJ SOLN: 10.0000 mL | Freq: Once | INTRAMUSCULAR | Status: DC

## 2022-10-02 MED ORDER — MORPHINE SULFATE (PF) 4 MG/ML IV SOLN
4.0000 mg | INTRAVENOUS | Status: DC | PRN
  Filled 2022-10-02: qty 1

## 2022-10-02 MED ORDER — HYDROCODONE-ACETAMINOPHEN 5-325 MG PO TABS
1.0000 | ORAL_TABLET | ORAL | Status: DC | PRN
  Administered 2022-10-04 – 2022-10-06 (×7): 2 via ORAL
  Filled 2022-10-02 (×7): qty 2

## 2022-10-02 MED ORDER — ACETAMINOPHEN 325 MG PO TABS: 650.0000 mg | ORAL_TABLET | Freq: Four times a day (QID) | ORAL | Status: DC | PRN

## 2022-10-02 MED ORDER — METOCLOPRAMIDE HCL 5 MG PO TABS: 5.0000 mg | ORAL_TABLET | Freq: Three times a day (TID) | ORAL | Status: DC | PRN

## 2022-10-02 MED ORDER — ENOXAPARIN SODIUM 40 MG/0.4ML IJ SOSY: 40.0000 mg | PREFILLED_SYRINGE | INTRAMUSCULAR | Status: DC

## 2022-10-02 MED ORDER — HYDROCODONE-ACETAMINOPHEN 7.5-325 MG PO TABS
1.0000 | ORAL_TABLET | ORAL | Status: DC | PRN
  Administered 2022-10-05: 1 via ORAL
  Filled 2022-10-02: qty 1
  Filled 2022-10-02 (×2): qty 2

## 2022-10-02 MED ORDER — CEFAZOLIN SODIUM-DEXTROSE 2-4 GM/100ML-% IV SOLN: 2.0000 g | INTRAVENOUS | Status: AC

## 2022-10-02 MED ORDER — ACETAMINOPHEN 10 MG/ML IV SOLN
INTRAVENOUS | Status: DC | PRN
  Administered 2022-10-02: 1000 mg via INTRAVENOUS

## 2022-10-02 MED ORDER — SODIUM CHLORIDE 0.9 % IV SOLN: INTRAVENOUS | Status: DC

## 2022-10-02 MED ORDER — HYDRALAZINE HCL 20 MG/ML IJ SOLN: 5.0000 mg | Freq: Four times a day (QID) | INTRAMUSCULAR | Status: DC | PRN

## 2022-10-02 SURGICAL SUPPLY — 2 items
IMMOBILIZER SHDR LG LX 900803 (SOFTGOODS) IMPLANT
IMMOBILIZER SHDR XL LX WHT (SOFTGOODS) IMPLANT

## 2022-10-02 NOTE — Sedation Documentation (Signed)
Xray called for stat portable

## 2022-10-02 NOTE — ED Triage Notes (Signed)
Pt here with left shoulder pain. Pt had a small fall on Sat morning. Pt states she is supposed to have surgery on that shoulder soon. Pt states the fall was small and she is unsure of if she messed it up.

## 2022-10-02 NOTE — Sedation Documentation (Addendum)
MD Roxan Hockey manipulating L shoulder @this  time. O2 has dropped to 85% on RA, placed on 6L [place]- O2 has improved to 100%.

## 2022-10-02 NOTE — Consult Note (Addendum)
ORTHOPAEDIC CONSULTATION  REQUESTING PHYSICIAN: No att. providers found  Chief Complaint:   Left shoulder injury  History of Present Illness: Tina Pacheco is a 72 y.o. female with a history of HTN, CAD, DDD, OA, and DJD who presented to the emergency room today after an injury to her left shoulder sustained 2 days ago when trying to transfer from her wheelchair.  She has a history of severe arthropathy of her left shoulder and is scheduled to undergo future surgical intervention on her left shoulder.  She is unable to use her left arm for the last 2 days due to severe pain and was brought to emergency by EMS today for evaluation.  Found to have an irreducible anterior shoulder dislocation after multiple times from the ER provider.  Patient is n.p.o. and neurovascular intact and denies any chest pain shortness of breath, fevers, chills, or other injuries.   Patient is right-hand dominant  Past Medical History:  Diagnosis Date   (HFpEF) heart failure with preserved ejection fraction (HCC)    a.) TTE 08/10/2014: EF 55-60%, mild MAC, mild LAE, mild MR, PASP 38; b.) TTE 03/31/2019: EF 55-60%, mild LVH, mild-mod LAW, mod MAC, mild-mod AoV sclerosis, triv PR, G1DD; c.) TTE 05/29/2020: EF 60-65%, mild LVH, mod LAE, G1DD   Anemia    Aortic atherosclerosis (HCC)    Asthma    Basal cell carcinoma    CAD (coronary artery disease)    a.) LHC 05/24/2010 - nonobstructive CAD - med mgmt; b.) LHC 06/04/2020: 30% dLAD, 40% o-mLAD - med mgmt   Chronic, continuous use of opioids    Claustrophobia    COPD (chronic obstructive pulmonary disease) (HCC)    DDD (degenerative disc disease), lumbosacral    Diuretic-induced hypokalemia    Dyslipidemia    Fibromyalgia    Frequent falls    GERD (gastroesophageal reflux disease)    Gouty arthropathy    Hemihypertrophy    History of bilateral cataract extraction 2018   Hyperplastic colonic polyp  2003   Hypertension    Long term current use of amiodarone    Long term current use of anticoagulant    a.) apixaban   Long term current use of immunosuppressive drug    a.) certolizumab pegol  + predisone for RA diagnosis   MDD (major depressive disorder)    Migraines    Morbid obesity (HCC)    Nontraumatic complete tear of rotator cuff, left    OSA (obstructive sleep apnea)    a.) non-complaint with prescribed nocturnal PAP therapy   Osteoarthritis    PAF (paroxysmal atrial fibrillation) (HCC)    a.) CHA2DS2-VASc = 5 (age, sex, CHF, HTN, vascular disease history); b.) rate and rhythm maintained on oral amiodarone + metoprolol succinate; chronically anticoagulated with standard dose apixaban   Pneumonia    Prediabetes    Rheumatoid arthritis (HCC)    a.) on certolizumab pegol + predisone   Stage 3 chronic kidney disease (HCC)    Past Surgical History:  Procedure Laterality Date   ABDOMINAL HYSTERECTOMY     APPLICATION OF WOUND VAC Right 08/09/2017   Procedure: APPLICATION OF WOUND VAC;  Surgeon: Recardo Evangelist, DPM;  Location: [facility];  Service: Podiatry;  Laterality: Right;   CARDIAC CATHETERIZATION  05/24/2010   nonobstructive CAD   CATARACT EXTRACTION W/ INTRAOCULAR LENS IMPLANT Right 06/12/2016   Dr. Mia Creek   CATARACT EXTRACTION W/ INTRAOCULAR LENS IMPLANT Left 06/26/2016   Dr. Mia Creek   CESAREAN SECTION  CHOLECYSTECTOMY     COLONOSCOPY  06/12/2011   Procedure: COLONOSCOPY;  Surgeon: Charna Elizabeth, MD;  Location: [facility];  Service: Endoscopy;  Laterality: N/A;   COLONOSCOPY N/A 03/17/2013   Procedure: COLONOSCOPY;  Surgeon: Charna Elizabeth, MD;  Location: [facility];  Service: Endoscopy;  Laterality: N/A;   FOOT ARTHRODESIS Right 07/13/2017   Procedure: ARTHRODESIS FOOT-MULTI.FUSIONS (6 JOINTS);  Surgeon: Recardo Evangelist, DPM;  Location: [facility];  Service: Podiatry;  Laterality: Right;   IRRIGATION AND DEBRIDEMENT FOOT Right 08/09/2017   Procedure:  IRRIGATION AND DEBRIDEMENT FOOT;  Surgeon: Recardo Evangelist, DPM;  Location: [facility];  Service: Podiatry;  Laterality: Right;   KNEE ARTHROSCOPY Bilateral    bilateral   LEFT HEART CATH AND CORONARY ANGIOGRAPHY N/A 06/04/2020   Procedure: LEFT HEART CATH AND CORONARY ANGIOGRAPHY;  Surgeon: Iran Ouch, MD;  Location: [facility];  Service: Cardiovascular;  Laterality: N/A;   REVERSE SHOULDER ARTHROPLASTY Right 11/16/2020   Procedure: REVERSE SHOULDER ARTHROPLASTY;  Surgeon: Christena Flake, MD;  Location: [facility];  Service: Orthopedics;  Laterality: Right;   SHOULDER CLOSED REDUCTION Right 11/11/2020   Procedure: CLOSED REDUCTION SHOULDER;  Surgeon: Christena Flake, MD;  Location: [facility];  Service: Orthopedics;  Laterality: Right;   SHOULDER INJECTION Right 11/11/2020   Procedure: SHOULDER INJECTION;  Surgeon: Christena Flake, MD;  Location: [facility];  Service: Orthopedics;  Laterality: Right;   SINUSOTOMY     TOOTH EXTRACTION  12/2016   TOTAL SHOULDER REVISION Right 06/27/2022   Procedure: TOTAL SHOULDER REVISION;  Surgeon: Christena Flake, MD;  Location: [facility];  Service: Orthopedics;  Laterality: Right;   Social History   Socioeconomic History   Marital status: Married    Spouse name: Rexford Maus   Number of children: 1   Years of education: Not on file   Highest education level: Bachelor's degree (e.g., BA, AB, BS)  Occupational History   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  Tobacco Use   Smoking status: Never    Passive exposure: Past   Smokeless tobacco: Never  Vaping Use   Vaping status: Never Used  Substance and Sexual Activity   Alcohol use: No   Drug use: No   Sexual activity: Not Currently  Other Topics Concern   Not on file  Social History Narrative   No living will   Daughter should be decision maker   Would accept resuscitation attempts--but no prolonged ventilator or tube feeds   Social Determinants of Health    Financial Resource Strain: Low Risk  (06/19/2022)   Overall Financial Resource Strain (CARDIA)    Difficulty of Paying Living Expenses: Not hard at all  Food Insecurity: No Food Insecurity (06/27/2022)   Hunger Vital Sign    Worried About Running Out of Food in the Last Year: Never true    Ran Out of Food in the Last Year: Never true  Transportation Needs: No Transportation Needs (06/27/2022)   PRAPARE - Administrator, Civil Service (Medical): No    Lack of Transportation (Non-Medical): No  Physical Activity: Unknown (06/19/2022)   Exercise Vital Sign    Days of Exercise per Week: 0 days    Minutes of Exercise per Session: Not on file  Stress: No Stress Concern Present (06/19/2022)   [employer] of Occupational Health - Occupational Stress Questionnaire    Feeling of Stress : Only a little  Social Connections: Moderately Isolated (06/19/2022)  Social Connection and Isolation Panel [NHANES]    Frequency of Communication with Friends and Family: More than three times a week    Frequency of Social Gatherings with Friends and Family: Three times a week    Attends Religious Services: Never    Active Member of Clubs or Organizations: No    Attends Engineer, structural: Not on file    Marital Status: Married   Family History  Problem Relation Age of Onset   Tuberculosis Mother    Parkinsonism Mother    Emphysema Father        smoked   Arthritis Father    Cancer Sister    Diabetes type II Sister    Breast cancer Sister    Tuberculosis Sister    Breast cancer Maternal Aunt    Colon cancer Neg Hx    Esophageal cancer Neg Hx    Pancreatic cancer Neg Hx    Stomach cancer Neg Hx    Allergies  Allergen Reactions   Fluoxetine Other (See Comments)    Headache, shaking, sleep issues Headache, shaking, sleep issues   Codeine     Nausea and vomiting/only when taking too much   Prior to Admission medications   Medication Sig Start Date End Date Taking?  Authorizing Provider  amiodarone (PACERONE) 200 MG tablet TAKE 1 TABLET(200 MG) BY MOUTH DAILY 09/11/22   Antonieta Iba, MD  amoxicillin (AMOXIL) 500 MG capsule Take 2,000 mg by mouth once. Prior to dental procedures    [provider]  apixaban (ELIQUIS) 5 MG TABS tablet Take 1 tablet (5 mg total) by mouth 2 (two) times daily. 03/08/22   Dunn, Raymon Mutton, PA-C  atorvastatin (LIPITOR) 40 MG tablet TAKE 1 TABLET(40 MG) BY MOUTH DAILY 06/05/22   Karie Schwalbe, MD  Certolizumab Pegol [facility]) Inject into the skin. Patient states taking 1 x per month    [provider]  ketoconazole (NIZORAL) 2 % cream Apply 1 Application topically as needed. 10/27/20   [provider]  metolazone (ZAROXOLYN) 2.5 MG tablet TAKE 1 TABLET BY MOUTH DAILY AS NEEDED. MAY GIVE 1 TABLET AS NEEDED FOR WEIGHT GAIN OF 5 LB IN 1 WEEK 08/02/22   Tillman Abide I, MD  metoprolol succinate (TOPROL-XL) 50 MG 24 hr tablet TAKE 1 TABLET BY MOUTH EVERY DAY WITH OR IMMEDIATELY FOLLOWING A MEAL Patient taking differently: Take 50 mg by mouth at bedtime. TAKE 1 TABLET BY MOUTH EVERY DAY WITH OR IMMEDIATELY FOLLOWING A MEAL 04/12/22   Tillman Abide I, MD  montelukast (SINGULAIR) 10 MG tablet TAKE 1 TABLET BY MOUTH AT BEDTIME 12/12/21   Karie Schwalbe, MD  nitroGLYCERIN (NITROSTAT) 0.4 MG SL tablet Place 1 tablet (0.4 mg total) under the tongue every 5 (five) minutes as needed for chest pain. 06/05/20   Enedina Finner, MD  nystatin (MYCOSTATIN/NYSTOP) powder Apply 1 application topically 3 (three) times daily as needed. 12/18/20   Medina-Vargas, Monina C, NP  omeprazole (PRILOSEC) 40 MG capsule Take 1 capsule (40 mg total) by mouth at bedtime. 04/14/22   Karie Schwalbe, MD  ondansetron (ZOFRAN-ODT) 4 MG disintegrating tablet DISSOLVE 1 TABLET(4 MG) ON THE TONGUE EVERY 8 HOURS AS NEEDED FOR NAUSEA OR VOMITING 01/30/22   Karie Schwalbe, MD  Oxycodone HCl 20 MG TABS Take 1 tablet (20 mg total) by mouth 2 (two) times  daily as needed. 09/11/22   Karie Schwalbe, MD  polyethylene glycol (MIRALAX / Ethelene Hal) packet Take 17 g  by mouth daily as needed for mild constipation. 07/16/17   Houston Siren, MD  Potassium Chloride 40 MEQ/15ML (20%) SOLN Take 7.5 mLs by mouth daily. 12/30/21   Karie Schwalbe, MD  predniSONE (DELTASONE) 5 MG tablet Take 1 tablet (5 mg total) by mouth daily with breakfast. Patient taking differently: Take 5 mg by mouth at bedtime. 06/19/22   Karie Schwalbe, MD  promethazine (PHENERGAN) 12.5 MG tablet Take 1 tablet (12.5 mg total) by mouth every 6 (six) hours as needed for nausea or vomiting. 05/17/22   Zehr, Princella Pellegrini, PA-C  spironolactone (ALDACTONE) 25 MG tablet TAKE 1 TABLET(25 MG) BY MOUTH DAILY 08/02/22   Tillman Abide I, MD  terazosin (HYTRIN) 10 MG capsule TAKE 1 CAPSULE(10 MG) BY MOUTH AT BEDTIME 04/12/22   Tillman Abide I, MD  torsemide (DEMADEX) 20 MG tablet Take 1 tablet (20 mg total) by mouth daily. Patient taking differently: Take 40 mg by mouth daily. 02/28/22   Sondra Barges, PA-C   DG Shoulder 1V Left  Result Date: 10/02/2022 CLINICAL DATA:  Dislocation of left shoulder, postreduction view EXAM: LEFT SHOULDER COMPARISON:  Study done earlier today FINDINGS: Single AP view of the left shoulder shows persistent anterior dislocation. IMPRESSION: Persistent anterior dislocation. Electronically Signed   By: Ernie Avena M.D.   On: 10/02/2022 16:12   DG Shoulder 1V Left  Result Date: 10/02/2022 CLINICAL DATA:  Pain EXAM: LEFT SHOULDER one-view COMPARISON:  Earlier 10/02/2022 FINDINGS: Anterior shoulder dislocation still present. Postreduction. Imaging obtained to aid in treatment IMPRESSION: Persistent anterior shoulder dislocation Electronically Signed   By: Karen Kays M.D.   On: 10/02/2022 16:07   DG Shoulder Left  Result Date: 10/02/2022 CLINICAL DATA:  Left shoulder pain after a fall 2 days ago EXAM: LEFT SHOULDER - 2+ VIEW COMPARISON:  Shoulder radiographs 09/23/2018,  shoulder MRI 03/09/2018 FINDINGS: The humeral head is anteriorly and inferiorly dislocated relative to the glenoid. Acromioclavicular alignment is maintained. There is no definite acute fracture. There is greater tuberosity surface irregularity consistent with rotator cuff pathology. The soft tissues are unremarkable. IMPRESSION: Anterior humeral head dislocation.  No definite fracture. Electronically Signed   By: Lesia Hausen M.D.   On: 10/02/2022 12:44    Positive ROS: All other systems have been reviewed and were otherwise negative with the exception of those mentioned in the HPI and as above.  Physical Exam: General:  Alert, no acute distress Psychiatric:  Patient is competent for consent with normal mood and affect   Cardiovascular:  No pedal edema Respiratory:  No wheezing, non-labored breathing GI:  Abdomen is soft and non-tender Skin:  No lesions in the area of chief complaint Neurologic:  Sensation intact distally Lymphatic:  No axillary or cervical lymphadenopathy  Orthopedic Exam:  Left upper extremity Pain with any motion of the arm localized to the shoulder with tenderness palpation over the shoulder No tenderness to palpation over the elbow wrist hand or forearm. Compartments all soft Skin intact Neurovascular intact +AIN/PIN/U/R/M  Secondary survey No tenderness to palpation over other bony prominences in the lower extremities or right upper extremities, healed incision over right shoulder No pain with logroll or simulated axial loading of the bilateral lower extremity All compartments soft No tenderness to palpation over the cervical or thoracic spine, no bony step-off Motor grossly intact throughout, no focal deficits Sensation grossly intact throughout, no focal deficits Good distal pulses and capillary refill on all extremities   X-rays:  X-rays performed today images reviewed  by myself which show a persistent left anterior shoulder dislocation with chronic changes  to the glenoid and femoral head and greater tuberosity is consistent with severe arthropathy.  No obvious fractures or dislocations noted agree with radiology interpretation.  Assessment: Left shoulder dislocation  Plan: I reviewed the clinical and radiographic findings with the patient discussed treatment options.  She has severe left rotator cuff arthropathy in the setting of a shoulder dislocation.  It appears that this dislocation could be at least a few days old at this time.  Patient had significant issues in the past with a irreducible right shoulder dislocation which led to her previous right shoulder reverse arthroplasty.  We discussed the risks and benefits of closed reduction attempt under anesthesia in the operating room.  Reviewed the risk of fracture, skin tear, inability to reduce the dislocation, damage to nerves, vessels, other structures in the area.  Under shared decision making model and after reviewing these risks and benefits the patient has agreed to undergo a closed left shoulder dislocation.  We discussed that the shoulder remains irreducible after this attempt that we will discuss with her primary surgeon possibly intervening surgically in the left shoulder sooner.  Given her significant medical comorbidities I have consulted medicine for admission given that her family is out of town and she has no help at home at this time.  All questions answered patient agrees with the above plan.    Reinaldo Berber MD  Beeper #:  [phone]  10/02/2022 5:01 PM

## 2022-10-02 NOTE — Assessment & Plan Note (Signed)
Does not appear to be in acute exacerbation DuoNeb every 6 hours as needed for wheezing and shortness of breath, 3 days ordered

## 2022-10-02 NOTE — Sedation Documentation (Signed)
MD manipulating shoulder @this  time, Midwife assisting

## 2022-10-02 NOTE — ED Provider Notes (Signed)
[facility] Emergency Department Provider Note     Event Date/Time   First MD Initiated Contact with Patient 10/02/22 1143     (approximate)   History   Shoulder Pain   HPI  Tina Pacheco is a 72 y.o. female with a history of HTN, CAD, DDD, OA, and DJD, presents to the ED for evaluation of shoulder pain and disability on the left side.  Patient with a history of total reverse shoulder replacement on the right, presents after she had a fall Saturday morning.  Since that time she has had increased pain to the shoulder.  Patient's daughter called to the Ortho clinic on her behalf for advice.  Patient was advised to report to the ED for evaluation.  Patient notes she is scheduled to have surgery on his left shoulder in the near future.   Physical Exam   Triage Vital Signs: ED Triage Vitals  Encounter Vitals Group     BP 10/02/22 1128 (!) 159/104     Systolic BP Percentile --      Diastolic BP Percentile --      Pulse Rate 10/02/22 1128 77     Resp 10/02/22 1128 (!) 22     Temp 10/02/22 1128 98 F (36.7 C)     Temp Source 10/02/22 1128 Oral     SpO2 10/02/22 1128 96 %     Weight 10/02/22 1143 244 lb 14.9 oz (111.1 kg)     Height 10/02/22 1143 5\' 3"  (1.6 m)     Head Circumference --      Peak Flow --      Pain Score 10/02/22 1129 10     Pain Loc --      Pain Education --      Exclude from Growth Chart --     Most recent vital signs: Vitals:   10/02/22 1536 10/02/22 1541  BP: (!) 142/123   Pulse: 98 62  Resp: 18 18  Temp:    SpO2: 100% 100%    General Awake, no distress. NAD HEENT NCAT. PERRL. EOMI. No rhinorrhea. Mucous membranes are moist.  CV:  Good peripheral perfusion. RRR RESP:  Normal effort. CTA ABD:  No distention.  MSK:  LUE Without obvious deformity or location.  Left arm held in flexion.  Normal composite fist distally   ED Results / Procedures / Treatments   Labs (all labs ordered are listed, but only abnormal results  are displayed) Labs Reviewed - No data to display   EKG   RADIOLOGY  I personally viewed and evaluated these images as part of my medical decision making, as well as reviewing the written report by the radiologist.  ED Provider Interpretation: acute anterior dislocation  DG Shoulder Left  Result Date: 10/02/2022 CLINICAL DATA:  Left shoulder pain after a fall 2 days ago EXAM: LEFT SHOULDER - 2+ VIEW COMPARISON:  Shoulder radiographs 09/23/2018, shoulder MRI 03/09/2018 FINDINGS: The humeral head is anteriorly and inferiorly dislocated relative to the glenoid. Acromioclavicular alignment is maintained. There is no definite acute fracture. There is greater tuberosity surface irregularity consistent with rotator cuff pathology. The soft tissues are unremarkable. IMPRESSION: Anterior humeral head dislocation.  No definite fracture. Electronically Signed   By: Lesia Hausen M.D.   On: 10/02/2022 12:44     PROCEDURES:  Critical Care performed: No  Procedures  See Separate Procedure Note Entry  MEDICATIONS ORDERED IN ED: Medications  lidocaine (PF) (XYLOCAINE) 1 % injection 10 mL (  0 mLs Intradermal Hold 10/02/22 1354)  morphine (PF) 4 MG/ML injection 4 mg (has no administration in time range)  propofol (DIPRIVAN) 10 mg/mL bolus/IV push 55.6 mg (has no administration in time range)  ketamine 50 mg in normal saline 5 mL (10 mg/mL) syringe (has no administration in time range)  ondansetron (ZOFRAN) injection 4 mg (4 mg Intravenous Given 10/02/22 1357)  lidocaine (XYLOCAINE) 1 % (with pres) injection (  Given by Other 10/02/22 1354)     IMPRESSION / MDM / ASSESSMENT AND PLAN / ED COURSE  I reviewed the triage vital signs and the nursing notes.                              Differential diagnosis includes, but is not limited to, fracture, shoulder dislocation, shoulder sprain, tendinitis  Patient's presentation is most consistent with acute complicated illness / injury requiring diagnostic  workup.  Patient's diagnosis is consistent with anterior shoulder dislocation. Patient care will be transferred to my attending at this time for attempted reduction under conscious sedation.   Patient is given ED precautions to return to the ED for any worsening or new symptoms.     FINAL CLINICAL IMPRESSION(S) / ED DIAGNOSES   Final diagnoses:  Shoulder dislocation, left, initial encounter  Acute pain of left shoulder     Rx / DC Orders   ED Discharge Orders     None        Note:  This document was prepared using Dragon voice recognition software and may include unintentional dictation errors.    Lissa Hoard, PA-C 10/02/22 1555    Willy Eddy, MD 10/02/22 1556

## 2022-10-02 NOTE — Assessment & Plan Note (Signed)
-   CPAP nightly ordered 

## 2022-10-02 NOTE — Assessment & Plan Note (Signed)
PDMP reviewed Oxycodone-acetaminophen 5-3 25 with oxycodone 5 mg every 6 hours as needed for moderate pain, 20 hours of coverage ordered; morphine 4 mg IV every 4 hours as needed for severe pain, 18 hours of coverage ordered; fentanyl 50 mcg IV every 4 hours as needed for severe pain not responsive to IV morphine, 20 hours of coverage ordered

## 2022-10-02 NOTE — Op Note (Signed)
10/02/2022  6:07 PM  Patient:   Tina Pacheco  Pre-Op Diagnosis:   Closed left shoulder dislocation  Post-Op Diagnosis:   Same.  Procedure:   Manipulation attempted closed reduction of the left shoulder under anesthesia  Surgeon:   Reinaldo Berber MD  Assistant:   None  Anesthesia:   Procedural sedation in the operating room  Findings:   As above.  Complications:   None  EBL:   None  TT:   None  Drains:   None  Closure:   None  Implants:   None  Brief Clinical Note:   The patient is a 72 year old female with a history of left shoulder rotator cuff arthropathy who has a subacute left shoulder dislocation that is at least 50 days old potentially longer.  After discussing the risks and benefits of attempted closed reduction anesthesia as outlined in my consult note the patient agreed to proceed with attempted closed reduction of the left shoulder.  The patient is brought to the operating room at this time for closed reduction under general anesthesia of this dislocation.  Procedure:   The patient was brought into the operating room and lain in the supine position.  After procedural sedation was given the patient was oxygenated, a timeout was performed to verify the appropriate surgical site as the left upper extremity.  Shoulder was taken through range of motion and found to be grossly unstable with anterior perched on the glenoid.  Multiple techniques and attempts were used to reduce the glenohumeral dislocation and the shoulder was felt to reduce and when held with downward pressure a Grashey x-ray showed nearly concentric reduction.  However as soon as the hand was taken off of the arm the shoulder was then dislocated anteriorly again.  Additional maneuvers including traction countertraction, Milch maneuver, and additional manipulations were performed all with palpable reductions which subsequently spontaneously redislocated.  The shoulder was put in the most able position we could  find and x-rays showed near reduction with perching on the anterior glenoid and the patient was placed in a shoulder immobilizer to hold the arm in this position.  X-ray showed no fracture of the humerus out of the glenoid but there were significant degenerative changes which existed preoperatively.  Neurovascular exam was intact after reduction attempt with good radial pulse and sensation to the hand. The patient was then awakened, , and returned to the recovery room in stable condition after tolerating the procedure well.   I discussed the case with Dr. Joice Lofts and the patient and there is a tentative plan for return to the operating room tomorrow 10/03/2022 for possible reverse shoulder arthroplasty.  Will obtain a CT scan tonight to fully evaluate the bony morphology and have the patient n.p.o. after midnight and hold anticoagulation for potential surgery tomorrow.

## 2022-10-02 NOTE — ED Provider Notes (Addendum)
I have attempted close reduction of the shoulder using multiple techniques under procedural sedation without success of maintaining reduction.  Reduction somewhat limited due to body habitus and loss of anatomic landmarks.  Patient has been having chronic pain in his left shoulder but states that it got worse 2 days ago from this fall.  Due to my unsuccessful attempts will consult Ortho for further recommendations   Spoke with Dr. Audelia Acton who will see if the OR is available for reduction under anesthesia.   [facility] Injury Treatment  Date/Time: 10/02/2022 3:57 PM  Performed by: Willy Eddy, MD Authorized by: Willy Eddy, MD   Consent:    Consent obtained:  Verbal and writtenInjury location: shoulder Location details: left shoulder Injury type: dislocation Dislocation type: anterior Pre-procedure distal perfusion: normal Pre-procedure neurological function: normal Pre-procedure range of motion: reduced Anesthesia: local infiltration  Anesthesia: Local anesthesia used: yes Local Anesthetic: lidocaine 1% without epinephrine Anesthetic total: 10 mL Manipulation performed: yes Reduction method: external rotation, Milch technique, scapular manipulation and traction and counter traction Reduction successful: no Immobilization: sling Post-procedure neurovascular assessment: post-procedure neurovascularly intact   .Sedation  Date/Time: 10/02/2022 3:58 PM  Performed by: Willy Eddy, MD Authorized by: Willy Eddy, MD   Consent:    Consent obtained:  Verbal   Consent given by:  Patient Universal protocol:    Immediately prior to procedure, a time out was called: yes   Indications:    Procedure performed:  Dislocation reduction Pre-sedation assessment:    Time since last food or drink:  +4   ASA classification: class 3 - patient with severe systemic disease     Mouth opening:  3 or more finger widths   Thyromental distance:  3 finger widths   Mallampati score:   II - soft palate, uvula, fauces visible   Neck mobility: normal     Pre-sedation assessments completed and reviewed: airway patency, cardiovascular function, hydration status, mental status, nausea/vomiting, pain level, respiratory function and temperature   Immediate pre-procedure details:    Reviewed: vital signs and NPO status     Verified: bag valve mask available, intubation equipment available, oxygen available and suction available   Procedure details (see MAR for exact dosages):    Preoxygenation:  Nasal cannula   Sedation:  Propofol and ketamine   Intended level of sedation: deep   Analgesia:  Morphine   Intra-procedure monitoring:  Blood pressure monitoring, frequent LOC assessments, frequent vital sign checks and cardiac monitor   Intra-procedure events: none     Total Provider sedation time (minutes):  30     Willy Eddy, MD 10/02/22 1612    Willy Eddy, MD 10/02/22 [phone]

## 2022-10-02 NOTE — Anesthesia Postprocedure Evaluation (Signed)
Anesthesia Post Note  Patient: Tina Pacheco  Procedure(s) Performed: CLOSED REDUCTION SHOULDER (Left: Shoulder)  Patient location during evaluation: PACU Anesthesia Type: General Level of consciousness: awake and alert Pain management: pain level controlled Vital Signs Assessment: post-procedure vital signs reviewed and stable Respiratory status: spontaneous breathing, nonlabored ventilation, respiratory function stable and patient connected to nasal cannula oxygen Cardiovascular status: blood pressure returned to baseline and stable Postop Assessment: no apparent nausea or vomiting Anesthetic complications: no   No notable events documented.   Last Vitals:  Vitals:   10/02/22 1830 10/02/22 1839  BP: (!) 170/69   Pulse: 70 68  Resp: (!) 24 20  Temp:    SpO2:      Last Pain:  Vitals:   10/02/22 1839  TempSrc:   PainSc: 8                  Corinda Gubler

## 2022-10-02 NOTE — Progress Notes (Signed)
  Attempted closed reduction of the left shoulder found that the patient has a grossly unstable left shoulder which continues to redislocate after reduction.   I discussed the case with Dr. Joice Lofts and the patient and there is a tentative plan for return to the operating room tomorrow 10/03/2022 for possible reverse shoulder arthroplasty.  Will obtain a CT scan tonight to fully evaluate the bony morphology and have the patient n.p.o. after midnight and hold anticoagulation for potential surgery tomorrow.  Reinaldo Berber
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
## 2022-10-02 NOTE — Assessment & Plan Note (Signed)
-   This complicates overall care and prognosis.  

## 2022-10-02 NOTE — Sedation Documentation (Signed)
Ketamine 40mg , 50mg  of propofol given by MD Roxan Hockey

## 2022-10-02 NOTE — Sedation Documentation (Signed)
MD finished manipulating

## 2022-10-02 NOTE — Assessment & Plan Note (Addendum)
Status post attempted closed reduction of the shoulder per EDP and was unsuccessful Status post OR closed reduction under anesthesia by orthopedic service, with unsuccessful Per orthopedic specialist, patient will be discussed with Dr. Joice Lofts, patient's orthopedic surgeon for possible reverse shoulder arthroplasty Symptomatic support: Oxycodone-acetaminophen 5-325 mg p.o. with oxycodone 5 mg every 6 hours as needed for moderate pain, 20 hours of coverage ordered; morphine 4 mg IV every 4 hours as needed for severe pain, 18 hours ordered; fentanyl 50 mcg IV every 4 hours as needed for severe pain not responsive to IV morphine, 20 hours of coverage ordered Keep patient n.p.o. after midnight

## 2022-10-02 NOTE — Sedation Documentation (Signed)
Xray bedside.

## 2022-10-02 NOTE — Assessment & Plan Note (Signed)
-   Melatonin 5 mg nightly as needed for sleep ordered 

## 2022-10-02 NOTE — ED Notes (Signed)
First Nurse Note: Patient to ED via [facility] from home. Patient had fall on Saturday now having left shoulder pain since fall. Hx of replacement of right shoulder.

## 2022-10-02 NOTE — Anesthesia Preprocedure Evaluation (Signed)
Anesthesia Evaluation  Patient identified by MRN, date of birth, ID band Patient awake  General Assessment Comment:NPO since yesterday morning (no appetite, shoulder pain). Denies nausea or vomiting.  Reviewed: Allergy & Precautions, NPO status , Patient's Chart, lab work & pertinent test results  History of Anesthesia Complications Negative for: history of anesthetic complications  Airway Mallampati: IV  TM Distance: >3 FB Neck ROM: full    Dental  (+) Poor Dentition, Chipped   Pulmonary shortness of breath, with exertion, at rest and lying, asthma , sleep apnea , COPD   Pulmonary exam normal        Cardiovascular Exercise Tolerance: Poor hypertension, On Medications and On Home Beta Blockers (-) angina + CAD and +CHF  (-) Past MI and (-) Cardiac Stents Normal cardiovascular exam+ dysrhythmias Atrial Fibrillation  Rhythm:Regular Rate:Normal  Most recent TTE was performed on 05/29/2020 revealing normal left ventricular systolic function with an EF of 60-65%. There were no regional wall motion abnormalities. There was mild LVH. Left atrium was moderately enlarged. No significant valvular regurgitation observed. All transvalvular gradients were noted to be normal providing no evidence suggestive of valvular stenosis.    Neuro/Psych  Headaches PSYCHIATRIC DISORDERS Anxiety Depression     Neuromuscular disease    GI/Hepatic Neg liver ROS,GERD  Medicated,,  Endo/Other    Morbid obesity  Renal/GU CRFRenal disease     Musculoskeletal  (+) Arthritis , Osteoarthritis and Rheumatoid disorders,  Fibromyalgia -, narcotic dependent  Abdominal  (+) + obese  Peds  Hematology  (+) Blood dyscrasia, anemia On eliquis    Anesthesia Other Findings Past Medical History: No date: (HFpEF) heart failure with preserved ejection fraction (HCC)     Comment:  a.) TTE 08/10/2014: EF 55-60%, mild MAC, mild LAE, mild               MR, PASP 38; b.)  TTE 03/31/2019: EF 55-60%, mild LVH,               mild-mod LAW, mod MAC, mild-mod AoV sclerosis, triv PR,               G1DD; c.) TTE 05/29/2020: EF 60-65%, mild LVH, mod LAE,               G1DD No date: Anemia No date: Aortic atherosclerosis (HCC) No date: Asthma No date: Basal cell carcinoma No date: CAD (coronary artery disease)     Comment:  a.) LHC 05/24/2010 - nonobstructive CAD - med mgmt; b.)               LHC 06/04/2020: 30% dLAD, 40% o-mLAD - med mgmt No date: Chronic, continuous use of opioids No date: Claustrophobia No date: COPD (chronic obstructive pulmonary disease) (HCC) No date: DDD (degenerative disc disease), lumbosacral No date: Diuretic-induced hypokalemia No date: Dyslipidemia No date: Fibromyalgia No date: Frequent falls No date: GERD (gastroesophageal reflux disease) No date: 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: History of bilateral cataract extraction 2003: Hyperplastic colonic polyp No date: Hypertension No date: Long term current use of amiodarone No date: Long term current use of anticoagulant     Comment:  a.) apixaban No date: Long term current use of immunosuppressive drug     Comment:  a.) certolizumab pegol  + predisone for RA diagnosis No date: MDD (major depressive disorder) No date: Migraines No date: Morbid obesity (HCC) No date: Nontraumatic complete tear of rotator cuff, left No date: OSA (obstructive sleep apnea)     Comment:  a.)  non-complaint with prescribed nocturnal PAP therapy No date: Osteoarthritis No date: PAF (paroxysmal atrial fibrillation) (HCC)     Comment:  a.) CHA2DS2-VASc = 5 (age, sex, CHF, HTN, vascular               disease history); b.) rate and rhythm maintained on oral               amiodarone + metoprolol succinate; chronically               anticoagulated with standard dose apixaban No date: Pneumonia No date: Prediabetes No date: Rheumatoid arthritis (HCC)     Comment:  a.) on certolizumab pegol +  predisone No date: Stage 3 chronic kidney disease (HCC)  Past Surgical History: No date: ABDOMINAL HYSTERECTOMY 08/09/2017: APPLICATION OF WOUND VAC; Right     Comment:  Procedure: APPLICATION OF WOUND VAC;  Surgeon: Recardo Evangelist, DPM;  Location: [facility];  Service: Podiatry;                Laterality: Right; 05/24/2010: CARDIAC CATHETERIZATION     Comment:  nonobstructive CAD 06/12/2016: CATARACT EXTRACTION W/ INTRAOCULAR LENS IMPLANT; Right     Comment:  Dr. Mia Creek 06/26/2016: CATARACT EXTRACTION W/ INTRAOCULAR LENS IMPLANT; Left     Comment:  Dr. Mia Creek No date: CESAREAN SECTION No date: CHOLECYSTECTOMY 06/12/2011: COLONOSCOPY     Comment:  Procedure: COLONOSCOPY;  Surgeon: Charna Elizabeth, MD;                Location: [facility];  Service: Endoscopy;  Laterality:              N/A; 03/17/2013: COLONOSCOPY; N/A     Comment:  Procedure: COLONOSCOPY;  Surgeon: Charna Elizabeth, MD;                Location: [facility];  Service: Endoscopy;  Laterality:              N/A; 07/13/2017: FOOT ARTHRODESIS; Right     Comment:  Procedure: ARTHRODESIS FOOT-MULTI.FUSIONS (6 JOINTS);                Surgeon: Recardo Evangelist, DPM;  Location: [facility];                Service: Podiatry;  Laterality: Right; 08/09/2017: IRRIGATION AND DEBRIDEMENT FOOT; Right     Comment:  Procedure: IRRIGATION AND DEBRIDEMENT FOOT;  Surgeon:               Recardo Evangelist, DPM;  Location: [facility];  Service:               Podiatry;  Laterality: Right; No date: KNEE ARTHROSCOPY; Bilateral     Comment:  bilateral 06/04/2020: LEFT HEART CATH AND CORONARY ANGIOGRAPHY; N/A     Comment:  Procedure: LEFT HEART CATH AND CORONARY ANGIOGRAPHY;                Surgeon: Iran Ouch, MD;  Location: [facility]               [facility];  Service: Cardiovascular;  Laterality: N/A; 11/16/2020: REVERSE SHOULDER ARTHROPLASTY; Right     Comment:  Procedure: REVERSE SHOULDER ARTHROPLASTY;  Surgeon:                Christena Flake, MD;  Location: [facility];  Service:  Orthopedics;  Laterality: Right; 11/11/2020: SHOULDER CLOSED REDUCTION; Right     Comment:  Procedure: CLOSED REDUCTION SHOULDER;  Surgeon: Christena Flake, MD;  Location: [facility];  Service: Orthopedics;                Laterality: Right; 11/11/2020: SHOULDER INJECTION; Right     Comment:  Procedure: SHOULDER INJECTION;  Surgeon: Christena Flake,               MD;  Location: [facility];  Service: Orthopedics;                Laterality: Right; No date: SINUSOTOMY 12/2016: TOOTH EXTRACTION  BMI    Body Mass Index: 44.99 kg/m      Reproductive/Obstetrics negative OB ROS                             Anesthesia Physical Anesthesia Plan  ASA: 3  Anesthesia Plan: General   Post-op Pain Management: Ofirmev IV (intra-op)*   Induction: Intravenous  PONV Risk Score and Plan: 2 and Propofol infusion, TIVA, Ondansetron and Treatment may vary due to age or medical condition  Airway Management Planned: Mask and Natural Airway  Additional Equipment: None  Intra-op Plan:   Post-operative Plan:   Informed Consent: I have reviewed the patients History and Physical, chart, labs and discussed the procedure including the risks, benefits and alternatives for the proposed anesthesia with the patient or authorized representative who has indicated his/her understanding and acceptance.     Dental advisory given  Plan Discussed with: CRNA and Surgeon  Anesthesia Plan Comments: (Discussed risks of anesthesia with patient, including possibility of difficulty with spontaneous ventilation under anesthesia necessitating airway intervention and its associated risks (sore throat, damage to face/oropharynx/eyes), PONV, and rare risks such as cardiac or respiratory or neurological events, and allergic reactions. Discussed the role of CRNA in patient's perioperative care. Patient  understands. Patient informed about increased incidence of above perioperative risk due to high BMI. Patient understands.  )       Anesthesia Quick Evaluation

## 2022-10-02 NOTE — H&P (View-Only) (Signed)
ORTHOPAEDIC CONSULTATION  REQUESTING PHYSICIAN: No att. providers found  Chief Complaint:   Left shoulder injury  History of Present Illness: Tina Pacheco is a 72 y.o. female with a history of HTN, CAD, DDD, OA, and DJD who presented to the emergency room today after an injury to her left shoulder sustained 2 days ago when trying to transfer from her wheelchair.  She has a history of severe arthropathy of her left shoulder and is scheduled to undergo future surgical intervention on her left shoulder.  She is unable to use her left arm for the last 2 days due to severe pain and was brought to emergency by EMS today for evaluation.  Found to have an irreducible anterior shoulder dislocation after multiple times from the ER provider.  Patient is n.p.o. and neurovascular intact and denies any chest pain shortness of breath, fevers, chills, or other injuries.   Patient is right-hand dominant  Past Medical History:  Diagnosis Date   (HFpEF) heart failure with preserved ejection fraction (HCC)    a.) TTE 08/10/2014: EF 55-60%, mild MAC, mild LAE, mild MR, PASP 38; b.) TTE 03/31/2019: EF 55-60%, mild LVH, mild-mod LAW, mod MAC, mild-mod AoV sclerosis, triv PR, G1DD; c.) TTE 05/29/2020: EF 60-65%, mild LVH, mod LAE, G1DD   Anemia    Aortic atherosclerosis (HCC)    Asthma    Basal cell carcinoma    CAD (coronary artery disease)    a.) LHC 05/24/2010 - nonobstructive CAD - med mgmt; b.) LHC 06/04/2020: 30% dLAD, 40% o-mLAD - med mgmt   Chronic, continuous use of opioids    Claustrophobia    COPD (chronic obstructive pulmonary disease) (HCC)    DDD (degenerative disc disease), lumbosacral    Diuretic-induced hypokalemia    Dyslipidemia    Fibromyalgia    Frequent falls    GERD (gastroesophageal reflux disease)    Gouty arthropathy    Hemihypertrophy    History of bilateral cataract extraction 2018   Hyperplastic colonic polyp  2003   Hypertension    Long term current use of amiodarone    Long term current use of anticoagulant    a.) apixaban   Long term current use of immunosuppressive drug    a.) certolizumab pegol  + predisone for RA diagnosis   MDD (major depressive disorder)    Migraines    Morbid obesity (HCC)    Nontraumatic complete tear of rotator cuff, left    OSA (obstructive sleep apnea)    a.) non-complaint with prescribed nocturnal PAP therapy   Osteoarthritis    PAF (paroxysmal atrial fibrillation) (HCC)    a.) CHA2DS2-VASc = 5 (age, sex, CHF, HTN, vascular disease history); b.) rate and rhythm maintained on oral amiodarone + metoprolol succinate; chronically anticoagulated with standard dose apixaban   Pneumonia    Prediabetes    Rheumatoid arthritis (HCC)    a.) on certolizumab pegol + predisone   Stage 3 chronic kidney disease (HCC)    Past Surgical History:  Procedure Laterality Date   ABDOMINAL HYSTERECTOMY     APPLICATION OF WOUND VAC Right 08/09/2017   Procedure: APPLICATION OF WOUND VAC;  Surgeon: Recardo Evangelist, DPM;  Location: [facility];  Service: Podiatry;  Laterality: Right;   CARDIAC CATHETERIZATION  05/24/2010   nonobstructive CAD   CATARACT EXTRACTION W/ INTRAOCULAR LENS IMPLANT Right 06/12/2016   Dr. Mia Creek   CATARACT EXTRACTION W/ INTRAOCULAR LENS IMPLANT Left 06/26/2016   Dr. Mia Creek   CESAREAN SECTION  CHOLECYSTECTOMY     COLONOSCOPY  06/12/2011   Procedure: COLONOSCOPY;  Surgeon: Charna Elizabeth, MD;  Location: [facility];  Service: Endoscopy;  Laterality: N/A;   COLONOSCOPY N/A 03/17/2013   Procedure: COLONOSCOPY;  Surgeon: Charna Elizabeth, MD;  Location: [facility];  Service: Endoscopy;  Laterality: N/A;   FOOT ARTHRODESIS Right 07/13/2017   Procedure: ARTHRODESIS FOOT-MULTI.FUSIONS (6 JOINTS);  Surgeon: Recardo Evangelist, DPM;  Location: [facility];  Service: Podiatry;  Laterality: Right;   IRRIGATION AND DEBRIDEMENT FOOT Right 08/09/2017   Procedure:  IRRIGATION AND DEBRIDEMENT FOOT;  Surgeon: Recardo Evangelist, DPM;  Location: [facility];  Service: Podiatry;  Laterality: Right;   KNEE ARTHROSCOPY Bilateral    bilateral   LEFT HEART CATH AND CORONARY ANGIOGRAPHY N/A 06/04/2020   Procedure: LEFT HEART CATH AND CORONARY ANGIOGRAPHY;  Surgeon: Iran Ouch, MD;  Location: [facility];  Service: Cardiovascular;  Laterality: N/A;   REVERSE SHOULDER ARTHROPLASTY Right 11/16/2020   Procedure: REVERSE SHOULDER ARTHROPLASTY;  Surgeon: Christena Flake, MD;  Location: [facility];  Service: Orthopedics;  Laterality: Right;   SHOULDER CLOSED REDUCTION Right 11/11/2020   Procedure: CLOSED REDUCTION SHOULDER;  Surgeon: Christena Flake, MD;  Location: [facility];  Service: Orthopedics;  Laterality: Right;   SHOULDER INJECTION Right 11/11/2020   Procedure: SHOULDER INJECTION;  Surgeon: Christena Flake, MD;  Location: [facility];  Service: Orthopedics;  Laterality: Right;   SINUSOTOMY     TOOTH EXTRACTION  12/2016   TOTAL SHOULDER REVISION Right 06/27/2022   Procedure: TOTAL SHOULDER REVISION;  Surgeon: Christena Flake, MD;  Location: [facility];  Service: Orthopedics;  Laterality: Right;   Social History   Socioeconomic History   Marital status: Married    Spouse name: Rexford Maus   Number of children: 1   Years of education: Not on file   Highest education level: Bachelor's degree (e.g., BA, AB, BS)  Occupational History   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  Tobacco Use   Smoking status: Never    Passive exposure: Past   Smokeless tobacco: Never  Vaping Use   Vaping status: Never Used  Substance and Sexual Activity   Alcohol use: No   Drug use: No   Sexual activity: Not Currently  Other Topics Concern   Not on file  Social History Narrative   No living will   Daughter should be decision maker   Would accept resuscitation attempts--but no prolonged ventilator or tube feeds   Social Determinants of Health    Financial Resource Strain: Low Risk  (06/19/2022)   Overall Financial Resource Strain (CARDIA)    Difficulty of Paying Living Expenses: Not hard at all  Food Insecurity: No Food Insecurity (06/27/2022)   Hunger Vital Sign    Worried About Running Out of Food in the Last Year: Never true    Ran Out of Food in the Last Year: Never true  Transportation Needs: No Transportation Needs (06/27/2022)   PRAPARE - Administrator, Civil Service (Medical): No    Lack of Transportation (Non-Medical): No  Physical Activity: Unknown (06/19/2022)   Exercise Vital Sign    Days of Exercise per Week: 0 days    Minutes of Exercise per Session: Not on file  Stress: No Stress Concern Present (06/19/2022)   [employer] of Occupational Health - Occupational Stress Questionnaire    Feeling of Stress : Only a little  Social Connections: Moderately Isolated (06/19/2022)  Social Connection and Isolation Panel [NHANES]    Frequency of Communication with Friends and Family: More than three times a week    Frequency of Social Gatherings with Friends and Family: Three times a week    Attends Religious Services: Never    Active Member of Clubs or Organizations: No    Attends Engineer, structural: Not on file    Marital Status: Married   Family History  Problem Relation Age of Onset   Tuberculosis Mother    Parkinsonism Mother    Emphysema Father        smoked   Arthritis Father    Cancer Sister    Diabetes type II Sister    Breast cancer Sister    Tuberculosis Sister    Breast cancer Maternal Aunt    Colon cancer Neg Hx    Esophageal cancer Neg Hx    Pancreatic cancer Neg Hx    Stomach cancer Neg Hx    Allergies  Allergen Reactions   Fluoxetine Other (See Comments)    Headache, shaking, sleep issues Headache, shaking, sleep issues   Codeine     Nausea and vomiting/only when taking too much   Prior to Admission medications   Medication Sig Start Date End Date Taking?  Authorizing Provider  amiodarone (PACERONE) 200 MG tablet TAKE 1 TABLET(200 MG) BY MOUTH DAILY 09/11/22   Antonieta Iba, MD  amoxicillin (AMOXIL) 500 MG capsule Take 2,000 mg by mouth once. Prior to dental procedures    [provider]  apixaban (ELIQUIS) 5 MG TABS tablet Take 1 tablet (5 mg total) by mouth 2 (two) times daily. 03/08/22   Dunn, Raymon Mutton, PA-C  atorvastatin (LIPITOR) 40 MG tablet TAKE 1 TABLET(40 MG) BY MOUTH DAILY 06/05/22   Karie Schwalbe, MD  Certolizumab Pegol [facility]) Inject into the skin. Patient states taking 1 x per month    [provider]  ketoconazole (NIZORAL) 2 % cream Apply 1 Application topically as needed. 10/27/20   [provider]  metolazone (ZAROXOLYN) 2.5 MG tablet TAKE 1 TABLET BY MOUTH DAILY AS NEEDED. MAY GIVE 1 TABLET AS NEEDED FOR WEIGHT GAIN OF 5 LB IN 1 WEEK 08/02/22   Tillman Abide I, MD  metoprolol succinate (TOPROL-XL) 50 MG 24 hr tablet TAKE 1 TABLET BY MOUTH EVERY DAY WITH OR IMMEDIATELY FOLLOWING A MEAL Patient taking differently: Take 50 mg by mouth at bedtime. TAKE 1 TABLET BY MOUTH EVERY DAY WITH OR IMMEDIATELY FOLLOWING A MEAL 04/12/22   Tillman Abide I, MD  montelukast (SINGULAIR) 10 MG tablet TAKE 1 TABLET BY MOUTH AT BEDTIME 12/12/21   Karie Schwalbe, MD  nitroGLYCERIN (NITROSTAT) 0.4 MG SL tablet Place 1 tablet (0.4 mg total) under the tongue every 5 (five) minutes as needed for chest pain. 06/05/20   Enedina Finner, MD  nystatin (MYCOSTATIN/NYSTOP) powder Apply 1 application topically 3 (three) times daily as needed. 12/18/20   Medina-Vargas, Monina C, NP  omeprazole (PRILOSEC) 40 MG capsule Take 1 capsule (40 mg total) by mouth at bedtime. 04/14/22   Karie Schwalbe, MD  ondansetron (ZOFRAN-ODT) 4 MG disintegrating tablet DISSOLVE 1 TABLET(4 MG) ON THE TONGUE EVERY 8 HOURS AS NEEDED FOR NAUSEA OR VOMITING 01/30/22   Karie Schwalbe, MD  Oxycodone HCl 20 MG TABS Take 1 tablet (20 mg total) by mouth 2 (two) times  daily as needed. 09/11/22   Karie Schwalbe, MD  polyethylene glycol (MIRALAX / Ethelene Hal) packet Take 17 g  by mouth daily as needed for mild constipation. 07/16/17   Houston Siren, MD  Potassium Chloride 40 MEQ/15ML (20%) SOLN Take 7.5 mLs by mouth daily. 12/30/21   Karie Schwalbe, MD  predniSONE (DELTASONE) 5 MG tablet Take 1 tablet (5 mg total) by mouth daily with breakfast. Patient taking differently: Take 5 mg by mouth at bedtime. 06/19/22   Karie Schwalbe, MD  promethazine (PHENERGAN) 12.5 MG tablet Take 1 tablet (12.5 mg total) by mouth every 6 (six) hours as needed for nausea or vomiting. 05/17/22   Zehr, Princella Pellegrini, PA-C  spironolactone (ALDACTONE) 25 MG tablet TAKE 1 TABLET(25 MG) BY MOUTH DAILY 08/02/22   Tillman Abide I, MD  terazosin (HYTRIN) 10 MG capsule TAKE 1 CAPSULE(10 MG) BY MOUTH AT BEDTIME 04/12/22   Tillman Abide I, MD  torsemide (DEMADEX) 20 MG tablet Take 1 tablet (20 mg total) by mouth daily. Patient taking differently: Take 40 mg by mouth daily. 02/28/22   Sondra Barges, PA-C   DG Shoulder 1V Left  Result Date: 10/02/2022 CLINICAL DATA:  Dislocation of left shoulder, postreduction view EXAM: LEFT SHOULDER COMPARISON:  Study done earlier today FINDINGS: Single AP view of the left shoulder shows persistent anterior dislocation. IMPRESSION: Persistent anterior dislocation. Electronically Signed   By: Ernie Avena M.D.   On: 10/02/2022 16:12   DG Shoulder 1V Left  Result Date: 10/02/2022 CLINICAL DATA:  Pain EXAM: LEFT SHOULDER one-view COMPARISON:  Earlier 10/02/2022 FINDINGS: Anterior shoulder dislocation still present. Postreduction. Imaging obtained to aid in treatment IMPRESSION: Persistent anterior shoulder dislocation Electronically Signed   By: Karen Kays M.D.   On: 10/02/2022 16:07   DG Shoulder Left  Result Date: 10/02/2022 CLINICAL DATA:  Left shoulder pain after a fall 2 days ago EXAM: LEFT SHOULDER - 2+ VIEW COMPARISON:  Shoulder radiographs 09/23/2018,  shoulder MRI 03/09/2018 FINDINGS: The humeral head is anteriorly and inferiorly dislocated relative to the glenoid. Acromioclavicular alignment is maintained. There is no definite acute fracture. There is greater tuberosity surface irregularity consistent with rotator cuff pathology. The soft tissues are unremarkable. IMPRESSION: Anterior humeral head dislocation.  No definite fracture. Electronically Signed   By: Lesia Hausen M.D.   On: 10/02/2022 12:44    Positive ROS: All other systems have been reviewed and were otherwise negative with the exception of those mentioned in the HPI and as above.  Physical Exam: General:  Alert, no acute distress Psychiatric:  Patient is competent for consent with normal mood and affect   Cardiovascular:  No pedal edema Respiratory:  No wheezing, non-labored breathing GI:  Abdomen is soft and non-tender Skin:  No lesions in the area of chief complaint Neurologic:  Sensation intact distally Lymphatic:  No axillary or cervical lymphadenopathy  Orthopedic Exam:  Left upper extremity Pain with any motion of the arm localized to the shoulder with tenderness palpation over the shoulder No tenderness to palpation over the elbow wrist hand or forearm. Compartments all soft Skin intact Neurovascular intact +AIN/PIN/U/R/M  Secondary survey No tenderness to palpation over other bony prominences in the lower extremities or right upper extremities, healed incision over right shoulder No pain with logroll or simulated axial loading of the bilateral lower extremity All compartments soft No tenderness to palpation over the cervical or thoracic spine, no bony step-off Motor grossly intact throughout, no focal deficits Sensation grossly intact throughout, no focal deficits Good distal pulses and capillary refill on all extremities   X-rays:  X-rays performed today images reviewed  by myself which show a persistent left anterior shoulder dislocation with chronic changes  to the glenoid and femoral head and greater tuberosity is consistent with severe arthropathy.  No obvious fractures or dislocations noted agree with radiology interpretation.  Assessment: Left shoulder dislocation  Plan: I reviewed the clinical and radiographic findings with the patient discussed treatment options.  She has severe left rotator cuff arthropathy in the setting of a shoulder dislocation.  It appears that this dislocation could be at least a few days old at this time.  Patient had significant issues in the past with a irreducible right shoulder dislocation which led to her previous right shoulder reverse arthroplasty.  We discussed the risks and benefits of closed reduction attempt under anesthesia in the operating room.  Reviewed the risk of fracture, skin tear, inability to reduce the dislocation, damage to nerves, vessels, other structures in the area.  Under shared decision making model and after reviewing these risks and benefits the patient has agreed to undergo a closed left shoulder dislocation.  We discussed that the shoulder remains irreducible after this attempt that we will discuss with her primary surgeon possibly intervening surgically in the left shoulder sooner.  Given her significant medical comorbidities I have consulted medicine for admission given that her family is out of town and she has no help at home at this time.  All questions answered patient agrees with the above plan.    Reinaldo Berber MD  Beeper #:  [phone]  10/02/2022 5:01 PM

## 2022-10-02 NOTE — Interval H&P Note (Signed)
Patient history and physical updated. Consent reviewed including risks, benefits, and alternatives to surgery. Patient agrees with above plan to proceed with left shoulder closed reduction under anesthesia.

## 2022-10-02 NOTE — Sedation Documentation (Addendum)
Ketamine administered 45mg , propofol 50mg 

## 2022-10-02 NOTE — ED Provider Notes (Signed)
I did not directly participate in care of this patient, but did receive report that the patient went to the operating room with orthopedics   Sharyn Creamer, MD 10/02/22 1920

## 2022-10-02 NOTE — Progress Notes (Signed)
Pt doesn't use cpap at home and doesn't want to use one of ours here. Says "she's never had one and doesn't need one".

## 2022-10-02 NOTE — Hospital Course (Signed)
[name] is a 72 year old femaleWith history of hyperlipidemia, GERD, rheumatoid arthritis, CAD, obstructive sleep apnea, morbid obesity, paroxysmal atrial fibrillation on anticoagulation, who presents to the emergency department for chief concerns of left shoulder pain after a fall.  Patient slid to the ground landing on her left side when she was changing from her wheelchair to the couch.  Patient was found to have anterior humeral head dislocation.  Patient had close reduction of the shoulder in the emergency department without success.  EDP consulted orthopedic service and patient was taken to the OR manipulation attempted closed reduction of the left shoulder under anesthesia.  Orthopedic service consulted [facility] for hospitalist admission due to patient's multiple medical comorbidity.  Per orthopedic service, closed reduction under anesthesia attempt was unsuccessful in the OR.  Orthopedic service will discuss with patient's orthopedic doctor, Dr. Joice Lofts for tentative plan to return to the OR tomorrow for possible reverse shoulder arthroplasty.  Vitals at the time of hospitalist consultation showed temperature 97.3, respiration rate of 16, heart rate of 77, blood pressure 181/77, SpO2 of 97% on room air.  Serum sodium 136, potassium 4.0, chloride 101, bicarb 25, BUN of 35, serum creatinine 1.76, EGFR 30, nonfasting blood glucose 93, WBC 7.7, hemoglobin 10.2, platelets of 240.  Left shoulder x-ray in the ED: Read as anterior humeral head dislocation.  No definite fracture.

## 2022-10-02 NOTE — Transfer of Care (Signed)
Immediate Anesthesia Transfer of Care Note  Patient: Tina Pacheco  Procedure(s) Performed: CLOSED REDUCTION SHOULDER (Left: Shoulder)  Patient Location: PACU  Anesthesia Type:General  Level of Consciousness: awake  Airway & Oxygen Therapy: Patient Spontanous Breathing and Patient connected to face mask oxygen  Post-op Assessment: Report given to RN and Post -op Vital signs reviewed and stable  Post vital signs: Reviewed and stable  Last Vitals:  Vitals Value Taken Time  BP 141/71 10/02/22 1807  Temp 36.4 C 10/02/22 1807  Pulse 71 10/02/22 1810  Resp 14 10/02/22 1810  SpO2 100 % 10/02/22 1807  Vitals shown include unfiled device data.  Last Pain:  Vitals:   10/02/22 1807  TempSrc:   PainSc: Asleep         Complications: No notable events documented.

## 2022-10-02 NOTE — Sedation Documentation (Signed)
20mg  of propofol given by MD Roxan Hockey

## 2022-10-03 ENCOUNTER — Encounter: Payer: Self-pay | Admitting: Orthopedic Surgery

## 2022-10-03 ENCOUNTER — Encounter
Admission: EM | Disposition: A | Discharge status: Skilled Nursing Facility | Payer: Self-pay | Source: Home / Self Care | Attending: Internal Medicine

## 2022-10-03 ENCOUNTER — Inpatient Hospital Stay: Payer: Medicare Other

## 2022-10-03 ENCOUNTER — Inpatient Hospital Stay: Payer: Medicare Other | Admitting: Certified Registered"

## 2022-10-03 DIAGNOSIS — S43005A Unspecified dislocation of left shoulder joint, initial encounter: Secondary | ICD-10-CM | POA: Diagnosis not present

## 2022-10-03 HISTORY — PX: REVERSE SHOULDER ARTHROPLASTY: SHX5054

## 2022-10-03 SURGERY — ARTHROPLASTY, SHOULDER, TOTAL, REVERSE
Anesthesia: General | Site: Shoulder | Laterality: Left

## 2022-10-03 MED ORDER — LACTATED RINGERS IV SOLN: INTRAVENOUS | Status: DC | PRN

## 2022-10-03 MED ORDER — CEFAZOLIN IN SODIUM CHLORIDE 3-0.9 GM/100ML-% IV SOLN
3.0000 g | Freq: Four times a day (QID) | INTRAVENOUS | Status: AC
  Administered 2022-10-03 – 2022-10-04 (×3): 3 g via INTRAVENOUS
  Filled 2022-10-03 (×3): qty 100

## 2022-10-03 MED ORDER — ROCURONIUM BROMIDE 100 MG/10ML IV SOLN
INTRAVENOUS | Status: DC | PRN
  Administered 2022-10-03: 50 mg via INTRAVENOUS

## 2022-10-03 MED ORDER — PHENYLEPHRINE 80 MCG/ML (10ML) SYRINGE FOR IV PUSH (FOR BLOOD PRESSURE SUPPORT)
PREFILLED_SYRINGE | INTRAVENOUS | Status: DC | PRN
  Administered 2022-10-03 (×2): 40 ug via INTRAVENOUS
  Administered 2022-10-03: 160 ug via INTRAVENOUS
  Administered 2022-10-03 (×2): 80 ug via INTRAVENOUS

## 2022-10-03 MED ORDER — MIDAZOLAM HCL 2 MG/2ML IJ SOLN
2.0000 mg | Freq: Once | INTRAMUSCULAR | Status: AC
  Administered 2022-10-03: 2 mg via INTRAVENOUS

## 2022-10-03 MED ORDER — PROPOFOL 10 MG/ML IV BOLUS
INTRAVENOUS | Status: DC | PRN
  Administered 2022-10-03: 120 mg via INTRAVENOUS

## 2022-10-03 MED ORDER — MIDAZOLAM HCL 2 MG/2ML IJ SOLN
INTRAMUSCULAR | Status: AC
  Filled 2022-10-03: qty 2

## 2022-10-03 MED ORDER — EPHEDRINE SULFATE (PRESSORS) 50 MG/ML IJ SOLN
INTRAMUSCULAR | Status: DC | PRN
  Administered 2022-10-03 (×3): 5 mg via INTRAVENOUS

## 2022-10-03 MED ORDER — BUPIVACAINE HCL (PF) 0.5 % IJ SOLN
INTRAMUSCULAR | Status: AC
  Filled 2022-10-03: qty 30

## 2022-10-03 MED ORDER — KETOROLAC TROMETHAMINE 15 MG/ML IJ SOLN
7.5000 mg | Freq: Four times a day (QID) | INTRAMUSCULAR | Status: AC
  Administered 2022-10-03 – 2022-10-04 (×4): 7.5 mg via INTRAVENOUS
  Filled 2022-10-03 (×4): qty 1

## 2022-10-03 MED ORDER — ACETAMINOPHEN 10 MG/ML IV SOLN
INTRAVENOUS | Status: AC
  Filled 2022-10-03: qty 100

## 2022-10-03 MED ORDER — OXYCODONE HCL 5 MG/5ML PO SOLN: 5.0000 mg | Freq: Once | ORAL | Status: DC | PRN

## 2022-10-03 MED ORDER — ONDANSETRON HCL 4 MG PO TABS: 4.0000 mg | ORAL_TABLET | Freq: Four times a day (QID) | ORAL | Status: DC | PRN

## 2022-10-03 MED ORDER — FENTANYL CITRATE (PF) 100 MCG/2ML IJ SOLN: 25.0000 ug | INTRAMUSCULAR | Status: DC | PRN

## 2022-10-03 MED ORDER — DOCUSATE SODIUM 100 MG PO CAPS
100.0000 mg | ORAL_CAPSULE | Freq: Two times a day (BID) | ORAL | Status: DC
  Administered 2022-10-03 – 2022-10-06 (×6): 100 mg via ORAL
  Filled 2022-10-03 (×6): qty 1

## 2022-10-03 MED ORDER — DIPHENHYDRAMINE HCL 12.5 MG/5ML PO ELIX: 12.5000 mg | ORAL_SOLUTION | ORAL | Status: DC | PRN

## 2022-10-03 MED ORDER — SODIUM CHLORIDE 0.9 % IV SOLN: INTRAVENOUS | Status: AC

## 2022-10-03 MED ORDER — PROMETHAZINE HCL 25 MG/ML IJ SOLN
INTRAMUSCULAR | Status: AC
  Filled 2022-10-03: qty 1

## 2022-10-03 MED ORDER — DEXAMETHASONE SODIUM PHOSPHATE 10 MG/ML IJ SOLN
INTRAMUSCULAR | Status: DC | PRN
  Administered 2022-10-03: 10 mg via INTRAVENOUS

## 2022-10-03 MED ORDER — CEFAZOLIN SODIUM-DEXTROSE 2-4 GM/100ML-% IV SOLN
INTRAVENOUS | Status: AC
  Filled 2022-10-03: qty 100

## 2022-10-03 MED ORDER — TRANEXAMIC ACID 1000 MG/10ML IV SOLN
INTRAVENOUS | Status: DC | PRN
  Administered 2022-10-03: 1000 mg via TOPICAL

## 2022-10-03 MED ORDER — FLEET ENEMA 7-19 GM/118ML RE ENEM: 1.0000 | ENEMA | Freq: Once | RECTAL | Status: DC | PRN

## 2022-10-03 MED ORDER — KETOROLAC TROMETHAMINE 15 MG/ML IJ SOLN
INTRAMUSCULAR | Status: AC
  Filled 2022-10-03: qty 1

## 2022-10-03 MED ORDER — SUGAMMADEX SODIUM 200 MG/2ML IV SOLN
INTRAVENOUS | Status: DC | PRN
  Administered 2022-10-03: 200 mg via INTRAVENOUS

## 2022-10-03 MED ORDER — ACETAMINOPHEN 500 MG PO TABS
1000.0000 mg | ORAL_TABLET | Freq: Four times a day (QID) | ORAL | Status: AC
  Administered 2022-10-03 – 2022-10-04 (×3): 1000 mg via ORAL
  Filled 2022-10-03 (×3): qty 2

## 2022-10-03 MED ORDER — ONDANSETRON HCL 4 MG/2ML IJ SOLN: 4.0000 mg | Freq: Four times a day (QID) | INTRAMUSCULAR | Status: DC | PRN

## 2022-10-03 MED ORDER — ACETAMINOPHEN 325 MG PO TABS: 325.0000 mg | ORAL_TABLET | Freq: Four times a day (QID) | ORAL | Status: DC | PRN

## 2022-10-03 MED ORDER — BISACODYL 10 MG RE SUPP: 10.0000 mg | Freq: Every day | RECTAL | Status: DC | PRN

## 2022-10-03 MED ORDER — 0.9 % SODIUM CHLORIDE (POUR BTL) OPTIME
TOPICAL | Status: DC | PRN
  Administered 2022-10-03: 500 mL

## 2022-10-03 MED ORDER — BUPIVACAINE HCL (PF) 0.5 % IJ SOLN
INTRAMUSCULAR | Status: AC
  Filled 2022-10-03: qty 10

## 2022-10-03 MED ORDER — PROPOFOL 10 MG/ML IV BOLUS
INTRAVENOUS | Status: AC
  Filled 2022-10-03: qty 20

## 2022-10-03 MED ORDER — SODIUM CHLORIDE 0.9 % IR SOLN
Status: DC | PRN
  Administered 2022-10-03: 3000 mL

## 2022-10-03 MED ORDER — FENTANYL CITRATE PF 50 MCG/ML IJ SOSY
100.0000 ug | PREFILLED_SYRINGE | Freq: Once | INTRAMUSCULAR | Status: AC
  Administered 2022-10-03: 50 ug via INTRAVENOUS

## 2022-10-03 MED ORDER — ENOXAPARIN SODIUM 40 MG/0.4ML IJ SOSY
40.0000 mg | PREFILLED_SYRINGE | INTRAMUSCULAR | Status: DC
  Administered 2022-10-04: 40 mg via SUBCUTANEOUS
  Filled 2022-10-03: qty 0.4

## 2022-10-03 MED ORDER — CEFAZOLIN SODIUM-DEXTROSE 2-3 GM-%(50ML) IV SOLR
INTRAVENOUS | Status: DC | PRN
  Administered 2022-10-03: 1 g via INTRAVENOUS
  Administered 2022-10-03: 2 g via INTRAVENOUS

## 2022-10-03 MED ORDER — MORPHINE SULFATE (PF) 2 MG/ML IV SOLN: 2.0000 mg | INTRAVENOUS | Status: DC | PRN

## 2022-10-03 MED ORDER — EPINEPHRINE PF 1 MG/ML IJ SOLN
INTRAMUSCULAR | Status: AC
  Filled 2022-10-03: qty 1

## 2022-10-03 MED ORDER — STERILE WATER FOR IRRIGATION IR SOLN
Status: DC | PRN
Start: 2022-10-03 — End: 2022-10-03
  Administered 2022-10-03: 1000 mL

## 2022-10-03 MED ORDER — FENTANYL CITRATE (PF) 100 MCG/2ML IJ SOLN
INTRAMUSCULAR | Status: AC
  Filled 2022-10-03: qty 2

## 2022-10-03 MED ORDER — FENTANYL CITRATE (PF) 100 MCG/2ML IJ SOLN
INTRAMUSCULAR | Status: DC | PRN
  Administered 2022-10-03 (×2): 50 ug via INTRAVENOUS

## 2022-10-03 MED ORDER — FENTANYL CITRATE PF 50 MCG/ML IJ SOSY
PREFILLED_SYRINGE | INTRAMUSCULAR | Status: AC
  Filled 2022-10-03: qty 2

## 2022-10-03 MED ORDER — KETOROLAC TROMETHAMINE 15 MG/ML IJ SOLN
15.0000 mg | Freq: Once | INTRAMUSCULAR | Status: AC
  Administered 2022-10-03: 15 mg via INTRAVENOUS

## 2022-10-03 MED ORDER — LIDOCAINE HCL (CARDIAC) PF 100 MG/5ML IV SOSY
PREFILLED_SYRINGE | INTRAVENOUS | Status: DC | PRN
  Administered 2022-10-03: 20 mg via INTRAVENOUS

## 2022-10-03 MED ORDER — PROMETHAZINE HCL 25 MG/ML IJ SOLN
6.2500 mg | Freq: Once | INTRAMUSCULAR | Status: AC
  Administered 2022-10-03: 6.25 mg via INTRAVENOUS

## 2022-10-03 MED ORDER — BUPIVACAINE LIPOSOME 1.3 % IJ SUSP
INTRAMUSCULAR | Status: AC
  Filled 2022-10-03: qty 20

## 2022-10-03 MED ORDER — PHENYLEPHRINE HCL (PRESSORS) 10 MG/ML IV SOLN
INTRAVENOUS | Status: AC
  Filled 2022-10-03: qty 1

## 2022-10-03 MED ORDER — ACETAMINOPHEN 10 MG/ML IV SOLN
INTRAVENOUS | Status: DC | PRN
  Administered 2022-10-03: 1000 mg via INTRAVENOUS

## 2022-10-03 MED ORDER — ONDANSETRON HCL 4 MG/2ML IJ SOLN
INTRAMUSCULAR | Status: DC | PRN
Start: 2022-10-03 — End: 2022-10-03
  Administered 2022-10-03 (×2): 4 mg via INTRAVENOUS

## 2022-10-03 MED ORDER — METOCLOPRAMIDE HCL 5 MG PO TABS: 5.0000 mg | ORAL_TABLET | Freq: Three times a day (TID) | ORAL | Status: DC | PRN

## 2022-10-03 MED ORDER — OXYCODONE HCL 5 MG PO TABS: 5.0000 mg | ORAL_TABLET | Freq: Once | ORAL | Status: DC | PRN

## 2022-10-03 MED ORDER — DEXMEDETOMIDINE HCL IN NACL 200 MCG/50ML IV SOLN
INTRAVENOUS | Status: DC | PRN
  Administered 2022-10-03 (×2): 4 ug via INTRAVENOUS

## 2022-10-03 MED ORDER — HYDROMORPHONE HCL 1 MG/ML IJ SOLN: 0.5000 mg | INTRAMUSCULAR | Status: DC | PRN

## 2022-10-03 MED ORDER — BUPIVACAINE-EPINEPHRINE (PF) 0.5% -1:200000 IJ SOLN
INTRAMUSCULAR | Status: DC | PRN
  Administered 2022-10-03: 30 mL

## 2022-10-03 MED ORDER — METOCLOPRAMIDE HCL 5 MG/ML IJ SOLN: 5.0000 mg | Freq: Three times a day (TID) | INTRAMUSCULAR | Status: DC | PRN

## 2022-10-03 MED ORDER — MAGNESIUM HYDROXIDE 400 MG/5ML PO SUSP
30.0000 mL | Freq: Every day | ORAL | Status: DC | PRN
  Administered 2022-10-04 – 2022-10-06 (×2): 30 mL via ORAL
  Filled 2022-10-03 (×3): qty 30

## 2022-10-03 MED ORDER — GLYCOPYRROLATE 0.2 MG/ML IJ SOLN
INTRAMUSCULAR | Status: DC | PRN
  Administered 2022-10-03: .2 mg via INTRAVENOUS

## 2022-10-03 SURGICAL SUPPLY — 76 items
APL PRP STRL LF DISP 70% ISPRP (MISCELLANEOUS) ×1
BASEPLATE AUG 24 20D (Plate) IMPLANT
BIT DRILL FLUTED 3.0 STRL (BIT) IMPLANT
BLADE SAW SAG 25X90X1.19 (BLADE) ×1 IMPLANT
BSPLAT GLND 24 20D FULL AUG (Plate) ×1 IMPLANT
CALIBRATOR GLENOID VIP 5-D (SYSTAGENIX WOUND MANAGEMENT) IMPLANT
CHLORAPREP W/TINT 26 (MISCELLANEOUS) ×1 IMPLANT
COOLER POLAR GLACIER W/PUMP (MISCELLANEOUS) ×1 IMPLANT
CUP SUT UNIV REV NEUTRAL 33 (Cup) IMPLANT
DRAPE 3/4 80X56 (DRAPES) ×1 IMPLANT
DRAPE INCISE IOBAN 66X45 STRL (DRAPES) ×1 IMPLANT
DRAPE TABLE BACK 80X90 (DRAPES) ×1 IMPLANT
DRSG OPSITE POSTOP 4X8 (GAUZE/BANDAGES/DRESSINGS) ×1 IMPLANT
ELECT BLADE 6.5 EXT (BLADE) IMPLANT
ELECT CAUTERY BLADE 6.4 (BLADE) ×1 IMPLANT
ELECT REM PT RETURN 9FT ADLT (ELECTROSURGICAL) ×1
ELECTRODE REM PT RTRN 9FT ADLT (ELECTROSURGICAL) ×1 IMPLANT
GAUZE XEROFORM 1X8 LF (GAUZE/BANDAGES/DRESSINGS) ×1 IMPLANT
GLENOSPHERE 33+4 LAT/24 UNI RV (Joint) IMPLANT
GLOVE BIO SURGEON STRL SZ7.5 (GLOVE) ×4 IMPLANT
GLOVE BIO SURGEON STRL SZ8 (GLOVE) ×4 IMPLANT
GLOVE BIOGEL PI IND STRL 8 (GLOVE) ×2 IMPLANT
GLOVE INDICATOR 8.0 STRL GRN (GLOVE) ×1 IMPLANT
GOWN STRL REUS W/ TWL LRG LVL3 (GOWN DISPOSABLE) ×1 IMPLANT
GOWN STRL REUS W/ TWL XL LVL3 (GOWN DISPOSABLE) ×1 IMPLANT
GOWN STRL REUS W/TWL LRG LVL3 (GOWN DISPOSABLE) ×1
GOWN STRL REUS W/TWL XL LVL3 (GOWN DISPOSABLE) ×1
HANDLE YANKAUER SUCT OPEN TIP (MISCELLANEOUS) ×1 IMPLANT
HOOD PEEL AWAY T7 (MISCELLANEOUS) ×3 IMPLANT
IV NS IRRIG 3000ML ARTHROMATIC (IV SOLUTION) ×1 IMPLANT
KIT STABILIZATION SHOULDER (MISCELLANEOUS) ×1 IMPLANT
KIT TURNOVER KIT A (KITS) ×1 IMPLANT
LINER HUM CONST SHLD 33 +6 (Liner) IMPLANT
MANIFOLD NEPTUNE II (INSTRUMENTS) ×1 IMPLANT
MASK FACE SPIDER DISP (MASK) ×1 IMPLANT
MAT ABSORB FLUID 56X50 GRAY (MISCELLANEOUS) ×1 IMPLANT
MOD POST FOR MGS BSPLATE 30 (Miscellaneous) ×1 IMPLANT
NDL MAYO CATGUT SZ1 (NEEDLE) IMPLANT
NDL SAFETY ECLIP 18X1.5 (MISCELLANEOUS) ×1 IMPLANT
NDL SPNL 20GX3.5 QUINCKE YW (NEEDLE) ×1 IMPLANT
NEEDLE MAYO CATGUT SZ1 (NEEDLE) IMPLANT
NEEDLE SPNL 20GX3.5 QUINCKE YW (NEEDLE) ×1 IMPLANT
NS IRRIG 500ML POUR BTL (IV SOLUTION) ×1 IMPLANT
PACK ARTHROSCOPY SHOULDER (MISCELLANEOUS) ×1 IMPLANT
PAD ARMBOARD 7.5X6 YLW CONV (MISCELLANEOUS) ×1 IMPLANT
PAD WRAPON POLAR SHDR UNIV (MISCELLANEOUS) ×1 IMPLANT
PAD WRAPON POLAR SHDR XLG (MISCELLANEOUS) IMPLANT
PIN NITINOL TARGETER 2.8 (PIN) IMPLANT
POST MODLR FOR MGS BSPLATE 30 (Miscellaneous) IMPLANT
PULSAVAC PLUS IRRIG FAN TIP (DISPOSABLE) ×1
REAMER ANGLED HEAD SMALL (DRILL) IMPLANT
SCREW PERI LOCK 5.5X24 (Screw) IMPLANT
SCREW PERIPHERAL 4.5X24 N/L (Screw) IMPLANT
SCREW PERIPHERAL 5.5X28 LOCK (Screw) IMPLANT
SCREW PERIPHERAL NL 4.5X36 (Screw) IMPLANT
SLING ULTRA II LG (MISCELLANEOUS) IMPLANT
SLING ULTRA II M (MISCELLANEOUS) IMPLANT
SPONGE T-LAP 18X18 ~~LOC~~+RFID (SPONGE) ×2 IMPLANT
STAPLER SKIN PROX 35W (STAPLE) ×1 IMPLANT
STEM HUMERAL UNI REVERS SZ6 (Stem) IMPLANT
SUT ETHIBOND 0 MO6 C/R (SUTURE) ×1 IMPLANT
SUT FIBERWIRE #2 38 BLUE 1/2 (SUTURE) ×2
SUT VIC AB 0 CT1 36 (SUTURE) ×1 IMPLANT
SUT VIC AB 2-0 CT1 27 (SUTURE) ×2
SUT VIC AB 2-0 CT1 TAPERPNT 27 (SUTURE) ×2 IMPLANT
SUT VIC AB 3-0 SH 27 (SUTURE) ×1
SUT VIC AB 3-0 SH 27X BRD (SUTURE) IMPLANT
SUTURE FIBERWR #2 38 BLUE 1/2 (SUTURE) ×4 IMPLANT
SYR 10ML LL (SYRINGE) ×1 IMPLANT
SYR 30ML LL (SYRINGE) ×1 IMPLANT
SYR TOOMEY 50ML (SYRINGE) ×1 IMPLANT
TIP FAN IRRIG PULSAVAC PLUS (DISPOSABLE) ×1 IMPLANT
TRAP FLUID SMOKE EVACUATOR (MISCELLANEOUS) ×1 IMPLANT
WATER STERILE IRR 500ML POUR (IV SOLUTION) ×1 IMPLANT
WRAPON POLAR PAD SHDR UNIV (MISCELLANEOUS)
WRAPON POLAR PAD SHDR XLG (MISCELLANEOUS) ×1

## 2022-10-03 NOTE — Anesthesia Procedure Notes (Signed)
Procedure Name: Intubation Date/Time: 10/03/2022 3:01 PM  Performed by: Mohammed Kindle, CRNAPre-anesthesia Checklist: Patient identified, Emergency Drugs available, Suction available and Patient being monitored Patient Re-evaluated:Patient Re-evaluated prior to induction Oxygen Delivery Method: Circle system utilized Preoxygenation: Pre-oxygenation with 100% oxygen Induction Type: IV induction Ventilation: Mask ventilation without difficulty Laryngoscope Size: McGraph and 3 Grade View: Grade I Tube type: Oral Tube size: 7.0 mm Number of attempts: 1 Airway Equipment and Method: Stylet and Oral airway Placement Confirmation: ETT inserted through vocal cords under direct vision, positive ETCO2, breath sounds checked- equal and bilateral and CO2 detector Secured at: 21 cm Tube secured with: Tape Dental Injury: Teeth and Oropharynx as per pre-operative assessment

## 2022-10-03 NOTE — Op Note (Addendum)
10/03/2022  5:34 PM  Patient:   Tina Pacheco  Pre-Op Diagnosis:   Irreducible anterior dislocation with underlying advanced cuff arthropathy secondary to massive rotator cuff tear, left shoulder.  Post-Op Diagnosis:   Same  Procedure:   Reverse left total shoulder arthroplasty with biceps tenolysis.  Surgeon:   Maryagnes Amos, MD  Assistant:   Horris Latino, PA-C  Anesthesia:   General endotracheal with an interscalene block using Exparel placed preoperatively by the anesthesiologist.  Findings:   As above.  Complications:   None  EBL:   125 cc  Fluids:   500 cc crystalloid  UOP:   None  TT:   None  Drains:   None  Closure:   Staples  Implants:   All press-fit Arthrex system with a #6 Univers Revers humeral stem, a 33 mm SutureCup with a +6 millimeter constrained insert, a 24 mm 20 degree augmented base plate with a 30 mm post, and a 33 mm +4 millimeter lateralized glenosphere.  Brief Clinical Note:   The patient is a 72 year old female with a long history of gradually worsening left shoulder pain, stiffness, and weakness secondary to progressive rotator cuff arthropathy secondary to a massive rotator cuff tear. The patient apparently fell several days ago at her home, further injuring her left shoulder. She finally presented to the emergency room yesterday where x-rays demonstrated an anterior dislocation of the shoulder. Despite numerous attempts both by the ER staff and the orthopedic surgeon on-call in the operating room, the shoulder could not be reduced. A post-reduction attempted CT scan confirmed the presence of a bony Bankart lesion with a massive rotator cuff tear. The patient presents at this time for a reverse left total shoulder arthroplasty.  Procedure:   The patient underwent placement of an interscalene block using Exparel by the anesthesiologist in the preoperative holding area before being brought into the operating room and lain in the supine position. The  patient then underwent general endotracheal intubation and anesthesia before the patient was repositioned in the beach chair position using the beach chair positioner. The left shoulder and upper extremity were prepped with ChloraPrep solution before being draped sterilely. Preoperative antibiotics were administered. A timeout was performed to verify the appropriate surgical site.    A standard anterior approach to the shoulder was made through an approximately 4-5 inch incision. The incision was carried down through the subcutaneous tissues to expose the deltopectoral fascia. The interval between the deltoid and pectoralis muscles was identified and this plane developed, retracting the cephalic vein laterally with the deltoid muscle. The conjoined tendon was identified. Its lateral margin was dissected and the Kolbel self-retraining retractor inserted. The "three sisters" were identified and cauterized. Bursal tissues were removed to improve visualization.   The biceps tendon was identified near the inferior aspect of the bicipital groove and released. The remnant of the subscapularis tendon was released from its attachment to the lesser tuberosity 1 cm proximal to its insertion and several tagging sutures placed. The inferior capsule was released with care after identifying and protecting the axillary nerve. The proximal humeral cut was made at approximately 30 of retroversion using the extra-medullary guide.   Attention was redirected to the glenoid. The labrum was debrided circumferentially before the center of the glenoid was marked with electrocautery. The guidewire was drilled into the glenoid neck using the appropriate guide. After verifying its position, it was overreamed with the degree augmented baseplate reamer to create a flat surface before the peripheral reamer  was introduced to clean off any peripheral soft tissues. The central hole was septation found to be 30 mm.  Therefore, the central 30  mm coring reamer was inserted to complete the glenoid preparation. The permanent 24 mm mini-baseplate with 20 degree augment and 30 mm post construct was put together on the back table, then impacted into place with care taken to maintain the appropriate rotation of the augmented baseplate.  The baseplate was then further secured using four peripheral screws. Two non-locking screws were placed superiorly and inferiorly while two locking screws were placed anteriorly and posteriorly. The permanent 33 mm +4 mm lateralized glenosphere was then impacted into place and its Morse taper locking mechanism verified using manual distraction. Finally, the glenosphere locking screw was inserted and tightened securely.  Attention was directed to the humeral side. The humeral canal was reamed with first the 5 mm and then the 6 mm reamer. The canal was broached beginning with a #5 broach and progressing to a #6 broach. This was left in place and the metaphyseal inset reamer was used to create the metaphyseal socket. A trial reduction performed using the 33 mm trial humeral platform with first the +3 mm insert and then the +6 mm insert. With the +6 mm insert, the arm demonstrated excellent range of motion as the hand could be brought across the chest to the opposite shoulder and brought to the top of the patient's head and to the patient's ear. The shoulder appeared stable throughout this range of motion. The joint was dislocated and the trial components removed. The permanent #6 Univers Revers stem was impacted into place with care taken to maintain the appropriate version. The permanent 33 mm SutureCup humeral platform was then impacted into place before the +6 mm constrained insert was snapped into place. The shoulder was relocated using two finger pressure and again placed through a range of motion with the findings as described above.  The wound was copiously irrigated with sterile saline solution using the jet lavage  system before a total of 30 cc of 0.5% Sensorcaine with epinephrine was injected into the pericapsular and peri-incisional tissues to help with postoperative analgesia. The subscapularis tendon was reapproximated to the SutureCup using #2 FiberWire interrupted sutures. The deltopectoral interval was closed using #0 Vicryl interrupted sutures before the subcutaneous tissues were closed using 2-0 Vicryl interrupted sutures. The skin was closed using staples. Prior to closing the skin, 1 g of transexemic acid in 10 cc of normal saline was injected intra-articularly to help with postoperative bleeding. A sterile occlusive dressing was applied to the wound before the arm was placed into a shoulder immobilizer with an abduction pillow. A Polar Care system also was applied to the shoulder. The patient was then transferred back to a hospital bed before being awakened, extubated, and returned to the recovery room in satisfactory condition after tolerating the procedure well.

## 2022-10-03 NOTE — Progress Notes (Signed)
OT Cancellation Note  Patient Details Name: Tina Pacheco MRN: [id number] DOB: November 26, 1950   Cancelled Treatment:    Reason Eval/Treat Not Completed: Other (comment).Per chart review, pt scheduled for reverse shoulder arthroplasty today. OT to complete orders and will await new orders after surgical intervention has been completed.   Jackquline Denmark, MS, OTR/L , CBIS ascom [phone]  10/03/22, 9:14 AM

## 2022-10-03 NOTE — Anesthesia Postprocedure Evaluation (Signed)
Anesthesia Post Note  Patient: Tina Pacheco  Procedure(s) Performed: REVERSE SHOULDER ARTHROPLASTY (Left: Shoulder)  Patient location during evaluation: PACU Anesthesia Type: General Level of consciousness: awake and alert Pain management: pain level controlled Vital Signs Assessment: post-procedure vital signs reviewed and stable Respiratory status: spontaneous breathing, nonlabored ventilation, respiratory function stable and patient connected to nasal cannula oxygen Cardiovascular status: blood pressure returned to baseline and stable Postop Assessment: no apparent nausea or vomiting Anesthetic complications: no   No notable events documented.   Last Vitals:  Vitals:   10/03/22 1746 10/03/22 1800  BP: (!) 141/70 100/88  Pulse: (!) 109 95  Resp: (!) 26 17  Temp: (!) 36.1 C   SpO2: 95% 91%    Last Pain:  Vitals:   10/03/22 1746  TempSrc:   PainSc: 0-No pain                 Yevette Edwards

## 2022-10-03 NOTE — Progress Notes (Signed)
   10/03/22 0900  Spiritual Encounters  Type of Visit Initial  Care provided to: Patient  Referral source Patient request  Reason for visit Advance directives  OnCall Visit No   Patient unsure if daughter is already HCPOA. Daughter to visit tomorrow and clarify and/or complete documentation.  ACP information packet left with patient for additional review.  Patient aware to notify nurse station when ready to proceed.

## 2022-10-03 NOTE — Progress Notes (Signed)
  PROGRESS NOTE    Tina Pacheco  PPI:[id number] DOB: 12/29/1950 DOA: 10/02/2022 PCP: Karie Schwalbe, MD  [id number]  LOS: 1 day   Brief hospital course:   Assessment & Plan: [name] is a 72 year old female with history of rheumatoid arthritis, CAD, obstructive sleep apnea, morbid obesity, paroxysmal atrial fibrillation on anticoagulation, who presented to the emergency department for chief concerns of worsening left shoulder pain after a fall.     * Shoulder dislocation, left, initial encounter --attempted closed reduction of the shoulder per EDP and was unsuccessful. Status post OR closed reduction under anesthesia by orthopedic service, also unsuccessful. --left REVERSE SHOULDER ARTHROPLASTY today.  Chronic obstructive pulmonary disease (HCC) Does not appear to be in acute exacerbation  At risk for falling Fall precautions, PT, OT Patient lives at home with her husband who had a stroke last year.  Their daughter and son-in-law moved in with them to help with the care of her husband. Daughter and son-in-law are stuck in [place] currently due to the storm and no one is home to help patient get around [facility] consulted  Sleep apnea, unspecified CPAP nightly ordered  Morbid obesity (HCC) This complicates overall care and prognosis.   Chronic pain syndrome on chronic opioids PDMP reviewed --cont home regimen --additional pain management per ortho  Insomnia Melatonin 5 mg nightly as needed for sleep ordered   DVT prophylaxis: Lovenox SQ Code Status: Full code  Family Communication:  Level of care: Telemetry Medical Dispo:   The patient is from: home Anticipated d/c is to: to be determined Anticipated d/c date is: 1-2 days medical ready Patient currently is not medically ready to d/c due to: just post-op, needs PT/OT   Subjective and Interval History:  Going for left REVERSE SHOULDER ARTHROPLASTY today.   Objective: Vitals:   10/03/22 1815 10/03/22  1830 10/03/22 1856 10/03/22 2019  BP: 121/63 (!) 129/54 122/63 (!) 134/59  Pulse: 96 81 91 79  Resp: 20 18 17 20   Temp:  (!) 97 F (36.1 C) 97.6 F (36.4 C) 97.7 F (36.5 C)  TempSrc:      SpO2: 91% 94% 93% 91%  Weight:      Height:        Intake/Output Summary (Last 24 hours) at 10/03/2022 2124 Last data filed at 10/03/2022 2044 Gross per 24 hour  Intake 1548.76 ml  Output 925 ml  Net 623.76 ml   Filed Weights   10/02/22 1522 10/02/22 1701 10/03/22 1326  Weight: 119.7 kg 119.7 kg 119.7 kg    Examination:   Constitutional: NAD, AAOx3 HEENT: conjunctivae and lids normal, EOMI CV: No cyanosis.   RESP: normal respiratory effort, on RA Neuro: II - XII grossly intact.   Psych: Normal mood and affect.  Appropriate judgement and reason   Data Reviewed: I have personally reviewed labs and imaging studies  Time spent: 35 minutes  Darlin Priestly, MD [facility] If 7PM-7AM, please contact night-coverage 10/03/2022, 9:24 PM

## 2022-10-03 NOTE — Plan of Care (Signed)
  Problem: Education: Goal: Knowledge of General Education information will improve Description: Including pain rating scale, medication(s)/side effects and non-pharmacologic comfort measures Outcome: Progressing   Problem: Health Behavior/Discharge Planning: Goal: Ability to manage health-related needs will improve Outcome: Progressing   Problem: Activity: Goal: Risk for activity intolerance will decrease Outcome: Progressing   

## 2022-10-03 NOTE — Consult Note (Signed)
ORTHOPAEDIC CONSULTATION  REQUESTING PHYSICIAN: Darlin Priestly, MD  Chief Complaint:   Left shoulder pain.  History of Present Illness: Tina Pacheco is a 72 y.o. female with multiple medical problems including COPD, congestive heart failure, coronary artery disease, fibromyalgia, gastroesophageal reflux disease, hypertension, morbid obesity, prediabetes, paroxysmal atrial fibrillation, and chronic kidney disease who lives independently with her daughter but is very immobile, usually confined to a wheelchair during the day.  The patient was in her usual state of health a few days ago when she apparently lost her balance and fell onto her left side, injuring her left shoulder.  Her daughter was away for the weekend, so the patient tried to "tough it out" until her daughter returned home, but could not.  She was brought to the emergency room by ambulance yesterday where x-rays of her left shoulder demonstrated an anterior dislocation.  Several attempts were made to review use of the shoulder by the ER provider which were unsuccessful.  Subsequently, the patient was brought to the operating room where several additional attempts were made by Dr. Audelia Acton.  Despite getting the shoulder reduced, it would not stay reduced, suggesting a further tearing of her chronic massive rotator cuff tear.  Therefore, the patient was admitted in preparation for more definitive management of this injury.  The patient denies any associated injuries resulting from her fall.  She did not strike her head or lose consciousness.  The patient also denies any lightheadedness, dizziness, chest pain, shortness of breath, or other symptoms which may have precipitated her fall.    A CT scan of the left shoulder last night demonstrated a small bony Bankart defect as well as significant degenerative changes which were already known.  In fact, the patient had been scheduled for a  reverse left total shoulder arthroplasty 3-4 months ago which was canceled because her right reverse shoulder arthroplasty was noted to be dislocated.  Therefore, she underwent a revision of the right reverse total shoulder arthroplasty from which she is still recuperating.  At the time of the surgery, she was noted to have subacute fractures of both the acromion and coracoid, rendering the shoulder quite potentially unstable.  Past Medical History:  Diagnosis Date   (HFpEF) heart failure with preserved ejection fraction (HCC)    a.) TTE 08/10/2014: EF 55-60%, mild MAC, mild LAE, mild MR, PASP 38; b.) TTE 03/31/2019: EF 55-60%, mild LVH, mild-mod LAW, mod MAC, mild-mod AoV sclerosis, triv PR, G1DD; c.) TTE 05/29/2020: EF 60-65%, mild LVH, mod LAE, G1DD   Anemia    Aortic atherosclerosis (HCC)    Asthma    Basal cell carcinoma    CAD (coronary artery disease)    a.) LHC 05/24/2010 - nonobstructive CAD - med mgmt; b.) LHC 06/04/2020: 30% dLAD, 40% o-mLAD - med mgmt   Chronic, continuous use of opioids    Claustrophobia    COPD (chronic obstructive pulmonary disease) (HCC)    DDD (degenerative disc disease), lumbosacral    Diuretic-induced hypokalemia    Dyslipidemia    Fibromyalgia    Frequent falls    GERD (gastroesophageal reflux disease)    Gouty arthropathy    Hemihypertrophy    History of bilateral cataract extraction 2018   Hyperplastic colonic polyp 2003   Hypertension    Long term current use of amiodarone    Long term current use of anticoagulant    a.) apixaban   Long term current use of immunosuppressive drug    a.) certolizumab pegol  +  predisone for RA diagnosis   MDD (major depressive disorder)    Migraines    Morbid obesity (HCC)    Nontraumatic complete tear of rotator cuff, left    OSA (obstructive sleep apnea)    a.) non-complaint with prescribed nocturnal PAP therapy   Osteoarthritis    PAF (paroxysmal atrial fibrillation) (HCC)    a.) CHA2DS2-VASc = 5 (age,  sex, CHF, HTN, vascular disease history); b.) rate and rhythm maintained on oral amiodarone + metoprolol succinate; chronically anticoagulated with standard dose apixaban   Pneumonia    Prediabetes    Rheumatoid arthritis (HCC)    a.) on certolizumab pegol + predisone   Stage 3 chronic kidney disease (HCC)    Past Surgical History:  Procedure Laterality Date   ABDOMINAL HYSTERECTOMY     APPLICATION OF WOUND VAC Right 08/09/2017   Procedure: APPLICATION OF WOUND VAC;  Surgeon: Recardo Evangelist, DPM;  Location: [facility];  Service: Podiatry;  Laterality: Right;   CARDIAC CATHETERIZATION  05/24/2010   nonobstructive CAD   CATARACT EXTRACTION W/ INTRAOCULAR LENS IMPLANT Right 06/12/2016   Dr. Mia Creek   CATARACT EXTRACTION W/ INTRAOCULAR LENS IMPLANT Left 06/26/2016   Dr. Mia Creek   CESAREAN SECTION     CHOLECYSTECTOMY     COLONOSCOPY  06/12/2011   Procedure: COLONOSCOPY;  Surgeon: Charna Elizabeth, MD;  Location: [facility];  Service: Endoscopy;  Laterality: N/A;   COLONOSCOPY N/A 03/17/2013   Procedure: COLONOSCOPY;  Surgeon: Charna Elizabeth, MD;  Location: [facility];  Service: Endoscopy;  Laterality: N/A;   FOOT ARTHRODESIS Right 07/13/2017   Procedure: ARTHRODESIS FOOT-MULTI.FUSIONS (6 JOINTS);  Surgeon: Recardo Evangelist, DPM;  Location: [facility];  Service: Podiatry;  Laterality: Right;   IRRIGATION AND DEBRIDEMENT FOOT Right 08/09/2017   Procedure: IRRIGATION AND DEBRIDEMENT FOOT;  Surgeon: Recardo Evangelist, DPM;  Location: [facility];  Service: Podiatry;  Laterality: Right;   KNEE ARTHROSCOPY Bilateral    bilateral   LEFT HEART CATH AND CORONARY ANGIOGRAPHY N/A 06/04/2020   Procedure: LEFT HEART CATH AND CORONARY ANGIOGRAPHY;  Surgeon: Iran Ouch, MD;  Location: [facility];  Service: Cardiovascular;  Laterality: N/A;   REVERSE SHOULDER ARTHROPLASTY Right 11/16/2020   Procedure: REVERSE SHOULDER ARTHROPLASTY;  Surgeon: Christena Flake, MD;  Location: [facility];   Service: Orthopedics;  Laterality: Right;   SHOULDER CLOSED REDUCTION Right 11/11/2020   Procedure: CLOSED REDUCTION SHOULDER;  Surgeon: Christena Flake, MD;  Location: [facility];  Service: Orthopedics;  Laterality: Right;   SHOULDER CLOSED REDUCTION Left 10/02/2022   Procedure: CLOSED REDUCTION SHOULDER;  Surgeon: Reinaldo Berber, MD;  Location: [facility];  Service: Orthopedics;  Laterality: Left;   SHOULDER INJECTION Right 11/11/2020   Procedure: SHOULDER INJECTION;  Surgeon: Christena Flake, MD;  Location: [facility];  Service: Orthopedics;  Laterality: Right;   SINUSOTOMY     TOOTH EXTRACTION  12/2016   TOTAL SHOULDER REVISION Right 06/27/2022   Procedure: TOTAL SHOULDER REVISION;  Surgeon: Christena Flake, MD;  Location: [facility];  Service: Orthopedics;  Laterality: Right;   Social History   Socioeconomic History   Marital status: Married    Spouse name: Rexford Maus   Number of children: 1   Years of education: Not on file   Highest education level: Bachelor's degree (e.g., BA, AB, BS)  Occupational History   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  Tobacco Use   Smoking status: Never    Passive exposure: Past  Smokeless tobacco: Never  Vaping Use   Vaping status: Never Used  Substance and Sexual Activity   Alcohol use: No   Drug use: No   Sexual activity: Not Currently  Other Topics Concern   Not on file  Social History Narrative   No living will   Daughter should be decision maker   Would accept resuscitation attempts--but no prolonged ventilator or tube feeds   Social Determinants of Health   Financial Resource Strain: Low Risk  (06/19/2022)   Overall Financial Resource Strain (CARDIA)    Difficulty of Paying Living Expenses: Not hard at all  Food Insecurity: No Food Insecurity (10/02/2022)   Hunger Vital Sign    Worried About Running Out of Food in the Last Year: Never true    Ran Out of Food in the Last Year: Never true  Transportation Needs: No  Transportation Needs (10/02/2022)   PRAPARE - Administrator, Civil Service (Medical): No    Lack of Transportation (Non-Medical): No  Physical Activity: Unknown (06/19/2022)   Exercise Vital Sign    Days of Exercise per Week: 0 days    Minutes of Exercise per Session: Not on file  Stress: No Stress Concern Present (06/19/2022)   [employer] of Occupational Health - Occupational Stress Questionnaire    Feeling of Stress : Only a little  Social Connections: Moderately Isolated (06/19/2022)   Social Connection and Isolation Panel [NHANES]    Frequency of Communication with Friends and Family: More than three times a week    Frequency of Social Gatherings with Friends and Family: Three times a week    Attends Religious Services: Never    Active Member of Clubs or Organizations: No    Attends Engineer, structural: Not on file    Marital Status: Married   Family History  Problem Relation Age of Onset   Tuberculosis Mother    Parkinsonism Mother    Emphysema Father        smoked   Arthritis Father    Cancer Sister    Diabetes type II Sister    Breast cancer Sister    Tuberculosis Sister    Breast cancer Maternal Aunt    Colon cancer Neg Hx    Esophageal cancer Neg Hx    Pancreatic cancer Neg Hx    Stomach cancer Neg Hx    Allergies  Allergen Reactions   Fluoxetine Other (See Comments)    Headache, shaking, sleep issues Headache, shaking, sleep issues   Codeine     Nausea and vomiting/only when taking too much   Prior to Admission medications   Medication Sig Start Date End Date Taking? Authorizing Provider  amiodarone (PACERONE) 200 MG tablet TAKE 1 TABLET(200 MG) BY MOUTH DAILY 09/11/22  Yes Gollan, Tollie Pizza, MD  apixaban (ELIQUIS) 5 MG TABS tablet Take 1 tablet (5 mg total) by mouth 2 (two) times daily. 03/08/22  Yes Dunn, Raymon Mutton, PA-C  atorvastatin (LIPITOR) 40 MG tablet TAKE 1 TABLET(40 MG) BY MOUTH DAILY 06/05/22  Yes Karie Schwalbe, MD   Certolizumab Pegol [facility]) Inject into the skin. Patient states taking 1 x per month   Yes [provider]  metolazone (ZAROXOLYN) 2.5 MG tablet TAKE 1 TABLET BY MOUTH DAILY AS NEEDED. MAY GIVE 1 TABLET AS NEEDED FOR WEIGHT GAIN OF 5 LB IN 1 WEEK 08/02/22  Yes Tillman Abide I, MD  metoprolol succinate (TOPROL-XL) 50 MG 24 hr tablet TAKE 1 TABLET BY MOUTH EVERY  DAY WITH OR IMMEDIATELY FOLLOWING A MEAL Patient taking differently: Take 50 mg by mouth at bedtime. TAKE 1 TABLET BY MOUTH EVERY DAY WITH OR IMMEDIATELY FOLLOWING A MEAL 04/12/22  Yes Tillman Abide I, MD  montelukast (SINGULAIR) 10 MG tablet TAKE 1 TABLET BY MOUTH AT BEDTIME 12/12/21  Yes Karie Schwalbe, MD  omeprazole (PRILOSEC) 40 MG capsule Take 1 capsule (40 mg total) by mouth at bedtime. 04/14/22  Yes Karie Schwalbe, MD  predniSONE (DELTASONE) 5 MG tablet Take 1 tablet (5 mg total) by mouth daily with breakfast. Patient taking differently: Take 5 mg by mouth at bedtime. 06/19/22  Yes Tillman Abide I, MD  spironolactone (ALDACTONE) 25 MG tablet TAKE 1 TABLET(25 MG) BY MOUTH DAILY 08/02/22  Yes Tillman Abide I, MD  terazosin (HYTRIN) 10 MG capsule TAKE 1 CAPSULE(10 MG) BY MOUTH AT BEDTIME 04/12/22  Yes Tillman Abide I, MD  torsemide (DEMADEX) 20 MG tablet Take 1 tablet (20 mg total) by mouth daily. Patient taking differently: Take 40 mg by mouth daily. 02/28/22  Yes Dunn, Raymon Mutton, PA-C  amoxicillin (AMOXIL) 500 MG capsule Take 2,000 mg by mouth once. Prior to dental procedures    [provider]  ketoconazole (NIZORAL) 2 % cream Apply 1 Application topically as needed. 10/27/20   [provider]  nitroGLYCERIN (NITROSTAT) 0.4 MG SL tablet Place 1 tablet (0.4 mg total) under the tongue every 5 (five) minutes as needed for chest pain. 06/05/20   Enedina Finner, MD  nystatin (MYCOSTATIN/NYSTOP) powder Apply 1 application topically 3 (three) times daily as needed. 12/18/20   Medina-Vargas, Monina C, NP   ondansetron (ZOFRAN-ODT) 4 MG disintegrating tablet DISSOLVE 1 TABLET(4 MG) ON THE TONGUE EVERY 8 HOURS AS NEEDED FOR NAUSEA OR VOMITING 01/30/22   Karie Schwalbe, MD  Oxycodone HCl 20 MG TABS Take 1 tablet (20 mg total) by mouth 2 (two) times daily as needed. 09/11/22   Karie Schwalbe, MD  polyethylene glycol (MIRALAX / Ethelene Hal) packet Take 17 g by mouth daily as needed for mild constipation. 07/16/17   Houston Siren, MD  Potassium Chloride 40 MEQ/15ML (20%) SOLN Take 7.5 mLs by mouth daily. 12/30/21   Karie Schwalbe, MD  promethazine (PHENERGAN) 12.5 MG tablet Take 1 tablet (12.5 mg total) by mouth every 6 (six) hours as needed for nausea or vomiting. 05/17/22   Zehr, Princella Pellegrini, PA-C   [place] OR NERVE BLOCK-IMAGE ONLY [facility])  Result Date: 10/03/2022 There is no interpretation for this exam.  This order is for images obtained during a surgical procedure.  Please See "Surgeries" Tab for more information regarding the procedure.   CT SHOULDER LEFT WO CONTRAST  Result Date: 10/02/2022 CLINICAL DATA:  Shoulder pain, chronic, instability suspected, xray done EXAM: CT OF THE UPPER LEFT EXTREMITY WITHOUT CONTRAST TECHNIQUE: Multidetector CT imaging of the upper left extremity was performed according to the standard protocol. RADIATION DOSE REDUCTION: This exam was performed according to the departmental dose-optimization program which includes automated exposure control, adjustment of the mA and/or kV according to patient size and/or use of iterative reconstruction technique. COMPARISON:  Shoulder x-ray earlier today FINDINGS: Bones/Joint/Cartilage Continued anterior dislocation of the left glenohumeral joint. There is a fracture fragment noted along the anterior aspect of the glenoid, likely Bankart fracture. Left shoulder joint effusion. Large fluid collection lateral to the humeral head and proximal shaft likely within overlying bursa. Ligaments Suboptimally assessed by CT. Muscles and Tendons Grossly  unremarkable. Soft tissues Negative IMPRESSION: Continued  anterior dislocation of the left glenohumeral joint with Bankart fracture. Associated moderate joint effusion. Fluid collection lateral to the humeral head and proximal shaft likely fluid within the overlying bursa. Electronically Signed   By: Charlett Nose M.D.   On: 10/02/2022 20:36   DG Shoulder Left  Result Date: 10/02/2022 CLINICAL DATA:  Elective surgery. Multiple attempts of closed reduction in the OR under anesthesia. EXAM: LEFT SHOULDER - 2+ VIEW COMPARISON:  Radiographs earlier today. FINDINGS: Multiple AP and axial views of the left shoulder obtained in the operating room during attempted reduction shoulder dislocation. Majority of these demonstrate persistent anterior shoulder dislocation. Final attempt demonstrates improved glenohumeral alignment, although humerus appears to be persistently anteriorly dislocated on the axial view. Suspect glenoid fracture, lower not well-defined. IMPRESSION: Persistent anterior shoulder dislocation. Final attempt demonstrates improved glenohumeral alignment, although humerus appears to be persistently anteriorly dislocated on the axial view. Electronically Signed   By: Narda Rutherford M.D.   On: 10/02/2022 18:17   DG Shoulder 1V Left  Result Date: 10/02/2022 CLINICAL DATA:  Dislocation of left shoulder, postreduction view EXAM: LEFT SHOULDER COMPARISON:  Study done earlier today FINDINGS: Single AP view of the left shoulder shows persistent anterior dislocation. IMPRESSION: Persistent anterior dislocation. Electronically Signed   By: Ernie Avena M.D.   On: 10/02/2022 16:12   DG Shoulder 1V Left  Result Date: 10/02/2022 CLINICAL DATA:  Pain EXAM: LEFT SHOULDER one-view COMPARISON:  Earlier 10/02/2022 FINDINGS: Anterior shoulder dislocation still present. Postreduction. Imaging obtained to aid in treatment IMPRESSION: Persistent anterior shoulder dislocation Electronically Signed   By: Karen Kays  M.D.   On: 10/02/2022 16:07   DG Shoulder Left  Result Date: 10/02/2022 CLINICAL DATA:  Left shoulder pain after a fall 2 days ago EXAM: LEFT SHOULDER - 2+ VIEW COMPARISON:  Shoulder radiographs 09/23/2018, shoulder MRI 03/09/2018 FINDINGS: The humeral head is anteriorly and inferiorly dislocated relative to the glenoid. Acromioclavicular alignment is maintained. There is no definite acute fracture. There is greater tuberosity surface irregularity consistent with rotator cuff pathology. The soft tissues are unremarkable. IMPRESSION: Anterior humeral head dislocation.  No definite fracture. Electronically Signed   By: Lesia Hausen M.D.   On: 10/02/2022 12:44    Positive ROS: All other systems have been reviewed and were otherwise negative with the exception of those mentioned in the HPI and as above.  Physical Exam: General:  Alert, no acute distress Psychiatric:  Patient is competent for consent with normal mood and affect   Cardiovascular:  No pedal edema Respiratory:  No wheezing, non-labored breathing GI:  Abdomen is soft and non-tender Skin:  No lesions in the area of chief complaint Neurologic:  Sensation intact distally Lymphatic:  No axillary or cervical lymphadenopathy  Orthopedic Exam:  Orthopedic examination is limited to left shoulder and upper extremity.  Skin inspection around the left shoulder is notable for some ecchymosis anteriorly as well as swelling, but otherwise unremarkable.  No erythema, abrasions, or other skin abnormalities are identified.  She has moderate tenderness to palpation over the anterior aspect of the shoulder.  She has more severe pain with any attempted active or passive motion of the shoulder.  She is grossly neurovascularly intact to the left upper extremity and hand.  X-rays:  A recent CT scan and plain radiographs of the left shoulder are available for review and have been reviewed by myself.  The findings are as described above.  Assessment: Chronic  rotator cuff arthropathy with new onset instability, left shoulder.  Plan: The treatment options, including both surgical and nonsurgical choices, have been discussed in detail with the patient.  The patient would like to proceed with surgical intervention to include a reverse left total shoulder arthroplasty with biceps tenodesis.  The risks (including bleeding, infection, nerve and/or blood vessel injury, persistent or recurrent pain, loosening or failure of the components, dislocation, need for further surgery, blood clots, strokes, heart attacks or arrhythmias, pneumonia, etc.) and benefits of the surgical procedure were discussed.  The patient states her understanding and agrees to proceed.  A formal written consent will be obtained by the nursing staff.  Thank you for asking me to participate in the care of this most pleasant yet unfortunate woman.  I will be happy to follow her with you.   Maryagnes Amos, MD  Beeper #:  [phone]  10/03/2022 2:32 PM

## 2022-10-03 NOTE — Plan of Care (Signed)

## 2022-10-03 NOTE — Progress Notes (Signed)
PT Cancellation Note  Patient Details Name: Tina Pacheco MRN: [id number] DOB: 1950/03/01   Cancelled Treatment:    Reason Eval/Treat Not Completed: Other (comment). Pt currently pending reverse shoulder Sx this date. Will complete current order and evaluation post op next date. Please re-order when medically appropriate.   , 10/03/2022, 9:15 AM Elizabeth Palau, PT, DPT, GCS [phone]

## 2022-10-03 NOTE — Transfer of Care (Signed)
Immediate Anesthesia Transfer of Care Note  Patient: Tina Pacheco  Procedure(s) Performed: REVERSE SHOULDER ARTHROPLASTY (Left: Shoulder)  Patient Location: PACU  Anesthesia Type:General  Level of Consciousness: drowsy  Airway & Oxygen Therapy: Patient connected to face mask oxygen  Post-op Assessment: Report given to RN and Post -op Vital signs reviewed and stable  Post vital signs: Reviewed and stable  Last Vitals:  Vitals Value Taken Time  BP 141/70 10/03/22 1746  Temp 36.1 C 10/03/22 1746  Pulse 100 10/03/22 1756  Resp 22 10/03/22 1756  SpO2 92 % 10/03/22 1756  Vitals shown include unfiled device data.  Last Pain:  Vitals:   10/03/22 1326  TempSrc: Tympanic  PainSc: 10-Worst pain ever      Patients Stated Pain Goal: 4 (10/02/22 1814)  Complications: No notable events documented.

## 2022-10-03 NOTE — Anesthesia Preprocedure Evaluation (Signed)
Anesthesia Evaluation  Patient identified by MRN, date of birth, ID band Patient awake  General Assessment Comment:NPO since yesterday morning (no appetite, shoulder pain). Denies nausea or vomiting.  Reviewed: Allergy & Precautions, NPO status , Patient's Chart, lab work & pertinent test results  History of Anesthesia Complications Negative for: history of anesthetic complications  Airway Mallampati: IV  TM Distance: >3 FB Neck ROM: full    Dental  (+) Poor Dentition, Chipped   Pulmonary shortness of breath, with exertion, at rest and lying, asthma , sleep apnea , COPD,  COPD inhaler   Pulmonary exam normal        Cardiovascular Exercise Tolerance: Poor hypertension, On Medications and On Home Beta Blockers (-) angina + CAD and +CHF  (-) Past MI and (-) Cardiac Stents Normal cardiovascular exam+ dysrhythmias Atrial Fibrillation  Rhythm:Regular Rate:Normal  Most recent TTE was performed on 05/29/2020 revealing normal left ventricular systolic function with an EF of 60-65%. There were no regional wall motion abnormalities. There was mild LVH. Left atrium was moderately enlarged. No significant valvular regurgitation observed. All transvalvular gradients were noted to be normal providing no evidence suggestive of valvular stenosis.    Neuro/Psych  Headaches PSYCHIATRIC DISORDERS Anxiety Depression     Neuromuscular disease    GI/Hepatic Neg liver ROS,GERD  Medicated,,  Endo/Other    Morbid obesity  Renal/GU      Musculoskeletal  (+)  narcotic dependent  Abdominal   Peds  Hematology  (+) Blood dyscrasia, anemia On eliquis    Anesthesia Other Findings Patient with PMH of COPD with multiple shoulder surgeries. Patient has never had a nerve block for her surgery in the past. Patient refusing nerve block today.   Past Medical History: No date: (HFpEF) heart failure with preserved ejection fraction (HCC)     Comment:  a.) TTE  08/10/2014: EF 55-60%, mild MAC, mild LAE, mild               MR, PASP 38; b.) TTE 03/31/2019: EF 55-60%, mild LVH,               mild-mod LAW, mod MAC, mild-mod AoV sclerosis, triv PR,               G1DD; c.) TTE 05/29/2020: EF 60-65%, mild LVH, mod LAE,               G1DD No date: Anemia No date: Aortic atherosclerosis (HCC) No date: Asthma No date: Basal cell carcinoma No date: CAD (coronary artery disease)     Comment:  a.) LHC 05/24/2010 - nonobstructive CAD - med mgmt; b.)               LHC 06/04/2020: 30% dLAD, 40% o-mLAD - med mgmt No date: Chronic, continuous use of opioids No date: Claustrophobia No date: COPD (chronic obstructive pulmonary disease) (HCC) No date: DDD (degenerative disc disease), lumbosacral No date: Diuretic-induced hypokalemia No date: Dyslipidemia No date: Fibromyalgia No date: Frequent falls No date: GERD (gastroesophageal reflux disease) No date: 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: History of bilateral cataract extraction 2003: Hyperplastic colonic polyp No date: Hypertension No date: Long term current use of amiodarone No date: Long term current use of anticoagulant     Comment:  a.) apixaban No date: Long term current use of immunosuppressive drug     Comment:  a.) certolizumab pegol  + predisone for RA diagnosis No date: MDD (major depressive disorder) No date: Migraines No date: Morbid obesity (HCC) No date:  Nontraumatic complete tear of rotator cuff, left No date: OSA (obstructive sleep apnea)     Comment:  a.) non-complaint with prescribed nocturnal PAP therapy No date: Osteoarthritis No date: PAF (paroxysmal atrial fibrillation) (HCC)     Comment:  a.) CHA2DS2-VASc = 5 (age, sex, CHF, HTN, vascular               disease history); b.) rate and rhythm maintained on oral               amiodarone + metoprolol succinate; chronically               anticoagulated with standard dose apixaban No date: Pneumonia No date:  Prediabetes No date: Rheumatoid arthritis (HCC)     Comment:  a.) on certolizumab pegol + predisone No date: Stage 3 chronic kidney disease (HCC)  Past Surgical History: No date: ABDOMINAL HYSTERECTOMY 08/09/2017: APPLICATION OF WOUND VAC; Right     Comment:  Procedure: APPLICATION OF WOUND VAC;  Surgeon: Recardo Evangelist, DPM;  Location: [facility];  Service: Podiatry;                Laterality: Right; 05/24/2010: CARDIAC CATHETERIZATION     Comment:  nonobstructive CAD 06/12/2016: CATARACT EXTRACTION W/ INTRAOCULAR LENS IMPLANT; Right     Comment:  Dr. Mia Creek 06/26/2016: CATARACT EXTRACTION W/ INTRAOCULAR LENS IMPLANT; Left     Comment:  Dr. Mia Creek No date: CESAREAN SECTION No date: CHOLECYSTECTOMY 06/12/2011: COLONOSCOPY     Comment:  Procedure: COLONOSCOPY;  Surgeon: Charna Elizabeth, MD;                Location: [facility];  Service: Endoscopy;  Laterality:              N/A; 03/17/2013: COLONOSCOPY; N/A     Comment:  Procedure: COLONOSCOPY;  Surgeon: Charna Elizabeth, MD;                Location: [facility];  Service: Endoscopy;  Laterality:              N/A; 07/13/2017: FOOT ARTHRODESIS; Right     Comment:  Procedure: ARTHRODESIS FOOT-MULTI.FUSIONS (6 JOINTS);                Surgeon: Recardo Evangelist, DPM;  Location: [facility];                Service: Podiatry;  Laterality: Right; 08/09/2017: IRRIGATION AND DEBRIDEMENT FOOT; Right     Comment:  Procedure: IRRIGATION AND DEBRIDEMENT FOOT;  Surgeon:               Recardo Evangelist, DPM;  Location: [facility];  Service:               Podiatry;  Laterality: Right; No date: KNEE ARTHROSCOPY; Bilateral     Comment:  bilateral 06/04/2020: LEFT HEART CATH AND CORONARY ANGIOGRAPHY; N/A     Comment:  Procedure: LEFT HEART CATH AND CORONARY ANGIOGRAPHY;                Surgeon: Iran Ouch, MD;  Location: [facility]               [facility];  Service: Cardiovascular;  Laterality: N/A; 11/16/2020: REVERSE  SHOULDER ARTHROPLASTY; Right     Comment:  Procedure: REVERSE SHOULDER ARTHROPLASTY;  Surgeon:               Leron Croak  J, MD;  Location: [facility];  Service:               Orthopedics;  Laterality: Right; 11/11/2020: SHOULDER CLOSED REDUCTION; Right     Comment:  Procedure: CLOSED REDUCTION SHOULDER;  Surgeon: Christena Flake, MD;  Location: [facility];  Service: Orthopedics;                Laterality: Right; 11/11/2020: SHOULDER INJECTION; Right     Comment:  Procedure: SHOULDER INJECTION;  Surgeon: Christena Flake,               MD;  Location: [facility];  Service: Orthopedics;                Laterality: Right; No date: SINUSOTOMY 12/2016: TOOTH EXTRACTION  BMI    Body Mass Index: 44.99 kg/m      Reproductive/Obstetrics negative OB ROS                             Anesthesia Physical Anesthesia Plan  ASA: 3  Anesthesia Plan: General ETT and General   Post-op Pain Management: Dilaudid IV   Induction: Intravenous  PONV Risk Score and Plan: 3 and Ondansetron, Dexamethasone and Midazolam  Airway Management Planned: Oral ETT  Additional Equipment:   Intra-op Plan:   Post-operative Plan: Extubation in OR  Informed Consent: I have reviewed the patients History and Physical, chart, labs and discussed the procedure including the risks, benefits and alternatives for the proposed anesthesia with the patient or authorized representative who has indicated his/her understanding and acceptance.     Dental Advisory Given  Plan Discussed with: Anesthesiologist, CRNA and Surgeon  Anesthesia Plan Comments: (Patient consented for risks of anesthesia including but not limited to:  - adverse reactions to medications - damage to eyes, teeth, lips or other oral mucosa - nerve damage due to positioning  - sore throat or hoarseness - Damage to heart, brain, nerves, lungs, other parts of body or loss of life  Patient voiced understanding.)        Anesthesia Quick Evaluation

## 2022-10-04 ENCOUNTER — Encounter: Payer: Self-pay | Admitting: Surgery

## 2022-10-04 DIAGNOSIS — I10 Essential (primary) hypertension: Secondary | ICD-10-CM

## 2022-10-04 DIAGNOSIS — I25119 Atherosclerotic heart disease of native coronary artery with unspecified angina pectoris: Secondary | ICD-10-CM

## 2022-10-04 DIAGNOSIS — S43005A Unspecified dislocation of left shoulder joint, initial encounter: Secondary | ICD-10-CM | POA: Diagnosis not present

## 2022-10-04 DIAGNOSIS — I480 Paroxysmal atrial fibrillation: Secondary | ICD-10-CM | POA: Diagnosis not present

## 2022-10-04 DIAGNOSIS — N1832 Chronic kidney disease, stage 3b: Secondary | ICD-10-CM | POA: Diagnosis not present

## 2022-10-04 DIAGNOSIS — I5032 Chronic diastolic (congestive) heart failure: Secondary | ICD-10-CM

## 2022-10-04 MED ORDER — SPIRONOLACTONE 25 MG PO TABS
25.0000 mg | ORAL_TABLET | Freq: Every day | ORAL | Status: DC
  Administered 2022-10-05 – 2022-10-06 (×2): 25 mg via ORAL
  Filled 2022-10-04 (×2): qty 1

## 2022-10-04 MED ORDER — MONTELUKAST SODIUM 10 MG PO TABS
10.0000 mg | ORAL_TABLET | Freq: Every day | ORAL | Status: DC
  Administered 2022-10-04 – 2022-10-05 (×2): 10 mg via ORAL
  Filled 2022-10-04 (×2): qty 1

## 2022-10-04 MED ORDER — APIXABAN 5 MG PO TABS
5.0000 mg | ORAL_TABLET | Freq: Two times a day (BID) | ORAL | Status: DC
  Administered 2022-10-04 – 2022-10-06 (×4): 5 mg via ORAL
  Filled 2022-10-04 (×4): qty 1

## 2022-10-04 MED ORDER — HYDROCODONE-ACETAMINOPHEN 5-325 MG PO TABS: 1.0000 | ORAL_TABLET | Freq: Four times a day (QID) | ORAL | 0 refills | Status: DC | PRN

## 2022-10-04 MED ORDER — TORSEMIDE 20 MG PO TABS
20.0000 mg | ORAL_TABLET | Freq: Every day | ORAL | Status: DC
  Administered 2022-10-04 – 2022-10-06 (×3): 20 mg via ORAL
  Filled 2022-10-04 (×3): qty 1

## 2022-10-04 MED ORDER — PANTOPRAZOLE SODIUM 40 MG PO TBEC
40.0000 mg | DELAYED_RELEASE_TABLET | Freq: Every day | ORAL | Status: DC
  Administered 2022-10-04 – 2022-10-06 (×3): 40 mg via ORAL
  Filled 2022-10-04 (×3): qty 1

## 2022-10-04 MED ORDER — METOPROLOL SUCCINATE ER 50 MG PO TB24
50.0000 mg | ORAL_TABLET | Freq: Every day | ORAL | Status: DC
  Administered 2022-10-04 – 2022-10-05 (×2): 50 mg via ORAL
  Filled 2022-10-04 (×2): qty 1

## 2022-10-04 MED ORDER — ENOXAPARIN SODIUM 40 MG/0.4ML IJ SOSY: 40.0000 mg | PREFILLED_SYRINGE | INTRAMUSCULAR | 0 refills | Status: DC

## 2022-10-04 NOTE — Plan of Care (Signed)

## 2022-10-04 NOTE — Plan of Care (Signed)
  Problem: Health Behavior/Discharge Planning: Goal: Ability to manage health-related needs will improve Outcome: Progressing   Problem: Clinical Measurements: Goal: Ability to maintain clinical measurements within normal limits will improve Outcome: Progressing   Problem: Activity: Goal: Risk for activity intolerance will decrease Outcome: Progressing   Problem: Nutrition: Goal: Adequate nutrition will be maintained Outcome: Progressing   Problem: Coping: Goal: Level of anxiety will decrease Outcome: Progressing   

## 2022-10-04 NOTE — Evaluation (Addendum)
Physical Therapy Evaluation Patient Details Name: Tina Pacheco MRN: [id number] DOB: Jul 14, 1950 Today's Date: 10/04/2022  History of Present Illness  Pt is a 72 y/o female admitted due to a fall resulting in L shoulder anterior dislocation. She is now s/p L reverse total shoulder arthroplasty as of 8/6. PMH includes R sh dislocation and reverse total shoulder revision (06/27/22), HLD, GERD, rheumatoid arthritis, CAD, OSA, obesity, Afib, COPD, chronic pain, heart failure, basal cell carcinoma, anemia, and falls.  Clinical Impression   Pt presents laying in bed, some pain in low back region. She currently lives at home with her spouse and daughter/family in a 1 story home with a ramp to enter. PTA she was  Primarily using a WC for mobility around house, but does not fit in bathroom. Pt able to ambulate from doorway>toilet with UE support from sink. Her daughter assists with bathing/dressing.   Pt required modA+2 for supine>sit and maxAx2 for sit>supine, both with HOB elevated and assistance for trunk control/LE guidance. Pt able to tolerate therex in sitting with no UE support. Pt performed sit<>stand with minA+2 for completion of movement. Pt mobility limited due to b/l NWB UE, fatigue in BLE. Pt would benefit from continued skilled therapy to maximize functional abilities.     If plan is discharge home, recommend the following: A lot of help with walking and/or transfers;A lot of help with bathing/dressing/bathroom;Direct supervision/assist for medications management;Help with stairs or ramp for entrance;Assist for transportation;Assistance with feeding;Assistance with cooking/housework;Direct supervision/assist for financial management   Can travel by private vehicle   No    Equipment Recommendations None recommended by PT  Recommendations for Other Services       Functional Status Assessment Patient has had a recent decline in their functional status and demonstrates the ability to make  significant improvements in function in a reasonable and predictable amount of time.     Precautions / Restrictions Precautions Precautions: Shoulder Shoulder Interventions: Shoulder sling/immobilizer Precaution Booklet Issued: No Restrictions Weight Bearing Restrictions: Yes RUE Weight Bearing: Non weight bearing LUE Weight Bearing: Non weight bearing      Mobility  Bed Mobility Overal bed mobility: Needs Assistance Bed Mobility: Supine to Sit, Sit to Supine     Supine to sit: Mod assist, +2 for physical assistance, HOB elevated Sit to supine: +2 for physical assistance, Max assist, HOB elevated   General bed mobility comments: unable to utilize b/l UE to maintain NWB precautions. Pt able to perform some scooting,  some abdominal strength present to assist with transitioning to sitting EOB    Transfers Overall transfer level: Needs assistance Equipment used: None Transfers: Sit to/from Stand Sit to Stand: +2 physical assistance, Min assist, From elevated surface           General transfer comment: minA for completion of movement    Ambulation/Gait                  Stairs            Wheelchair Mobility     Tilt Bed    Modified Rankin (Stroke Patients Only)       Balance Overall balance assessment: Needs assistance Sitting-balance support: No upper extremity supported, Feet supported Sitting balance-Leahy Scale: Good     Standing balance support: No upper extremity supported Standing balance-Leahy Scale: Fair  Pertinent Vitals/Pain Pain Assessment Pain Assessment: Faces Faces Pain Scale: Hurts a little bit Pain Location: back Pain Descriptors / Indicators: Discomfort, Grimacing Pain Intervention(s): Monitored during session    Home Living Family/patient expects to be discharged to:: Private residence Living Arrangements: Spouse/significant other;Children Available Help at Discharge:  Family;Available PRN/intermittently Type of Home: House Home Access: Ramped entrance       Home Layout: One level Home Equipment: Wheelchair - manual;Other (comment);Rollator (4 wheels);Lift chair (hemi walker) Additional Comments: Lives with spouse and daughter/family.    Prior Function Prior Level of Function : Needs assist;History of Falls (last six months)       Physical Assist : Mobility (physical);ADLs (physical) Mobility (physical): Bed mobility;Transfers;Gait;Stairs ADLs (physical): Bathing;Dressing;Toileting Mobility Comments: Primarily utilizes WC for mobility around house, but does not fit in bathroom. Pt able to ambulate from doorway>toilet with UE support from sink. Spends most of day in living room ADLs Comments: Daughter assists with bathing/dressing.Per pt, spouse provides limited assistance due to history of stroke.     Extremity/Trunk Assessment   Upper Extremity Assessment Upper Extremity Assessment: RUE deficits/detail;LUE deficits/detail RUE Deficits / Details: Limited UE use due to recent revision sx and limited PT intervention LUE Deficits / Details: Unable to perform ROM due to recent procedure    Lower Extremity Assessment Lower Extremity Assessment: Generalized weakness       Communication   Communication Communication: No apparent difficulties  Cognition Arousal: Alert Behavior During Therapy: WFL for tasks assessed/performed Overall Cognitive Status: Within Functional Limits for tasks assessed                                          General Comments      Exercises General Exercises - Lower Extremity Ankle Circles/Pumps: AROM, Both, 10 reps, Supine Quad Sets: Both, 10 reps, Supine, Strengthening Gluteal Sets: Strengthening, Both, 10 reps, Supine Long Arc Quad: Both, 10 reps, Seated, Strengthening Hip Flexion/Marching: Strengthening, Both, 10 reps, Seated Heel Raises: AROM, Both, 10 reps, Seated   Assessment/Plan     PT Assessment Patient needs continued PT services  PT Problem List Decreased strength;Decreased range of motion;Decreased activity tolerance;Decreased balance;Decreased mobility;Decreased knowledge of precautions;Decreased safety awareness;Decreased cognition       PT Treatment Interventions DME instruction;Gait training;Functional mobility training;Therapeutic activities;Therapeutic exercise;Patient/family education;Neuromuscular re-education;Balance training;Stair training    PT Goals (Current goals can be found in the Care Plan section)  Acute Rehab PT Goals Patient Stated Goal: return home PT Goal Formulation: With patient Time For Goal Achievement: 10/18/22 Potential to Achieve Goals: Fair    Frequency 7X/week     Co-evaluation               AM-PAC PT "6 Clicks" Mobility  Outcome Measure Help needed turning from your back to your side while in a flat bed without using bedrails?: A Lot Help needed moving from lying on your back to sitting on the side of a flat bed without using bedrails?: A Lot Help needed moving to and from a bed to a chair (including a wheelchair)?: A Lot Help needed standing up from a chair using your arms (e.g., wheelchair or bedside chair)?: A Lot Help needed to walk in hospital room?: A Lot Help needed climbing 3-5 steps with a railing? : Total 6 Click Score: 11    End of Session   Activity Tolerance: Patient tolerated treatment well Patient left: in bed;with  call bell/phone within reach;with bed alarm set   PT Visit Diagnosis: Other abnormalities of gait and mobility (R26.89);Repeated falls (R29.6);History of falling (Z91.81);Muscle weakness (generalized) (M62.81);Difficulty in walking, not elsewhere classified (R26.2)    Time: 1610-9604 PT Time Calculation (min) (ACUTE ONLY): 51 min   Charges:   PT Evaluation $PT Eval Low Complexity: 1 Low PT Treatments $Therapeutic Activity: 38-52 mins PT General Charges $$ ACUTE PT VISIT: 1  Visit         , PT, SPT 12:37 PM,10/04/22

## 2022-10-04 NOTE — Progress Notes (Signed)
Subjective: 1 Day Post-Op Procedure(s) (LRB): REVERSE SHOULDER ARTHROPLASTY (Left) Patient reports pain as mild.   Patient is well, and has had no acute complaints or problems Plan is to go Skilled nursing facility after hospital stay. PT and care management to assist with discharge planning. Negative for chest pain and shortness of breath Fever: no Gastrointestinal:Negative for nausea and vomiting  Objective: Vital signs in last 24 hours: Temp:  [97 F (36.1 C)-98.2 F (36.8 C)] 97.9 F (36.6 C) (08/07 0726) Pulse Rate:  [64-109] 73 (08/07 0726) Resp:  [14-26] 16 (08/07 0726) BP: (100-187)/(54-88) 117/56 (08/07 0726) SpO2:  [91 %-100 %] 100 % (08/07 0726) Weight:  [119.7 kg] 119.7 kg (08/06 1326)  Intake/Output from previous day:  Intake/Output Summary (Last 24 hours) at 10/04/2022 0753 Last data filed at 10/04/2022 0321 Gross per 24 hour  Intake 1605.01 ml  Output 825 ml  Net 780.01 ml    Intake/Output this shift: No intake/output data recorded.  Labs: Recent Labs    10/02/22 1648 10/03/22 0455 10/04/22 0423  HGB 10.2* 9.5* 9.3*   Recent Labs    10/03/22 0455 10/04/22 0423  WBC 7.3 6.6  RBC 3.44* 3.31*  HCT 30.7* 29.8*  PLT 219 192   Recent Labs    10/03/22 0455 10/04/22 0423  NA 139 139  K 4.2 4.9  CL 105 107  CO2 25 22  BUN 32* 28*  CREATININE 1.63* 1.61*  GLUCOSE 70 120*  CALCIUM 9.3 9.2   No results for input(s): "LABPT", "INR" in the last 72 hours.   EXAM General - Patient is Alert, Appropriate, and Oriented Extremity - ABD soft Intact pulses distally Incision: dressing C/D/I No cellulitis present Compartment soft Decreased sensation to the left shoulder from recent interscalene block. Dressing/Incision - clean, dry, no drainage noted to the left shoulder honeycomb dressing. Motor Function - intact, moving foot and toes well on exam.  Abdomen soft with intact bowel sounds this morning.  Past Medical History:  Diagnosis Date    (HFpEF) heart failure with preserved ejection fraction (HCC)    a.) TTE 08/10/2014: EF 55-60%, mild MAC, mild LAE, mild MR, PASP 38; b.) TTE 03/31/2019: EF 55-60%, mild LVH, mild-mod LAW, mod MAC, mild-mod AoV sclerosis, triv PR, G1DD; c.) TTE 05/29/2020: EF 60-65%, mild LVH, mod LAE, G1DD   Anemia    Aortic atherosclerosis (HCC)    Asthma    Basal cell carcinoma    CAD (coronary artery disease)    a.) LHC 05/24/2010 - nonobstructive CAD - med mgmt; b.) LHC 06/04/2020: 30% dLAD, 40% o-mLAD - med mgmt   Chronic, continuous use of opioids    Claustrophobia    COPD (chronic obstructive pulmonary disease) (HCC)    DDD (degenerative disc disease), lumbosacral    Diuretic-induced hypokalemia    Dyslipidemia    Fibromyalgia    Frequent falls    GERD (gastroesophageal reflux disease)    Gouty arthropathy    Hemihypertrophy    History of bilateral cataract extraction 2018   Hyperplastic colonic polyp 2003   Hypertension    Long term current use of amiodarone    Long term current use of anticoagulant    a.) apixaban   Long term current use of immunosuppressive drug    a.) certolizumab pegol  + predisone for RA diagnosis   MDD (major depressive disorder)    Migraines    Morbid obesity (HCC)    Nontraumatic complete tear of rotator cuff, left    OSA (obstructive  sleep apnea)    a.) non-complaint with prescribed nocturnal PAP therapy   Osteoarthritis    PAF (paroxysmal atrial fibrillation) (HCC)    a.) CHA2DS2-VASc = 5 (age, sex, CHF, HTN, vascular disease history); b.) rate and rhythm maintained on oral amiodarone + metoprolol succinate; chronically anticoagulated with standard dose apixaban   Pneumonia    PONV (postoperative nausea and vomiting)    Prediabetes    Rheumatoid arthritis (HCC)    a.) on certolizumab pegol + predisone   Stage 3 chronic kidney disease (HCC)     Assessment/Plan: 1 Day Post-Op Procedure(s) (LRB): REVERSE SHOULDER ARTHROPLASTY (Left) Principal Problem:    Shoulder dislocation, left, initial encounter Active Problems:   HTN (hypertension)   CAD (coronary artery disease)   Rheumatoid arthritis (HCC)   Insomnia   Chronic pain syndrome   RLS (restless legs syndrome)   Paroxysmal A-fib (HCC)   Chronic diastolic CHF (congestive heart failure) (HCC)   Polymyalgia rheumatica (HCC)   Stage 3b chronic kidney disease (HCC)   At risk for falling   Morbid obesity (HCC)   Sleep apnea, unspecified   Chronic obstructive pulmonary disease (HCC)   Mixed hyperlipidemia   Rheumatoid arthritis, unspecified (HCC)   Traumatic closed displaced fracture of left shoulder with anterior dislocation  Estimated body mass index is 46.75 kg/m as calculated from the following:   Height as of this encounter: 5\' 3"  (1.6 m).   Weight as of this encounter: 119.7 kg. Advance diet Up with therapy D/C IV fluids when tolerating po intake.  Labs and vitals reviewed this AM. Hg 9.3 this morning. Up with therapy today however patient is s/p right shoulder coracoid and acromion fracture in the setting of a right reverse total shoulder replacement.  Dont want much weight placed thru the right shoulder as well.  Primary work on bedside activities. She will need SNF following discharge.  DVT Prophylaxis - Lovenox and SCDs Non-weightbearing to bilateral upper extremities.  Valeria Batman, PA-C [facility] 10/04/2022, 7:53 AM

## 2022-10-04 NOTE — Evaluation (Signed)
Occupational Therapy Evaluation Patient Details Name: Tina Pacheco MRN: [id number] DOB: 1951/01/11 Today's Date: 10/04/2022   History of Present Illness Pt is a 72 y/o female admitted due to a fall resulting in L shoulder anterior dislocation. She is now s/p L reverse total shoulder arthroplasty as of 8/6. PMH includes R sh dislocation and reverse total shoulder revision (06/27/22), HLD, GERD, rheumatoid arthritis, CAD, OSA, obesity, Afib, COPD, chronic pain, heart failure, basal cell carcinoma, anemia, and falls.   Clinical Impression   Patient presenting with decreased Ind in self care,balance, functional mobility/transfers, endurance, and safety awareness. Patient reports living at home with husband, her daughter, and additional family. Pt does transfers self to wheelchair and propels to bathroom for toileting needs. Pt then stands and furniture walks toilet with family assistance. Her daughter assists her with ADLs at baseline. Patient is NWB on B UEs per chart but via secure chat with ortho she can utilize R UE for self care tasks with minimal weight bearing. Pt able to pick up drink from tray table with proper positioning to take sips. She is unable to feed self secondary to limited shoulder elevation in R UE to allow her to get food and bring to mouth without assistance. OT assisted her with holding phone to eat and ordering lunch. +2 assistance for bed mobility. Patient will benefit from acute OT to increase overall independence in the areas of ADLs, functional mobility, and safety awareness in order to safely discharge.      If plan is discharge home, recommend the following: Two people to help with walking and/or transfers;Two people to help with bathing/dressing/bathroom;Assistance with cooking/housework;Assist for transportation;Help with stairs or ramp for entrance    Functional Status Assessment  Patient has had a recent decline in their functional status and demonstrates the ability  to make significant improvements in function in a reasonable and predictable amount of time.  Equipment Recommendations  Other (comment) (defer to next venue of care)       Precautions / Restrictions Precautions Precautions: Shoulder Shoulder Interventions: Shoulder abduction pillow;Don joy ultra sling;At all times;Off for dressing/bathing/exercises Precaution Booklet Issued: No Restrictions Weight Bearing Restrictions: Yes RUE Weight Bearing: Non weight bearing LUE Weight Bearing: Non weight bearing Other Position/Activity Restrictions: Per secure chat with ortho, it is okay for pt to utilize R UE for self care tasks but minimize weight bearing.             ADL either performed or assessed with clinical judgement   ADL Overall ADL's : Needs assistance/impaired Eating/Feeding: Minimal assistance;Bed level;Set up                                     General ADL Comments: heavy +2 assistance for all self care and mobility secondary to B UE restrictions     Vision Patient Visual Report: No change from baseline           Extremity/Trunk Assessment Upper Extremity Assessment Upper Extremity Assessment: Right hand dominate RUE Deficits / Details: Limited UE use due to recent revision sx. limited shoulder elevation ~ 60 degrees LUE Deficits / Details: Unable to perform ROM due to recent procedure   Lower Extremity Assessment Lower Extremity Assessment: Generalized weakness       Communication Communication Communication: No apparent difficulties   Cognition Arousal: Alert Behavior During Therapy: WFL for tasks assessed/performed Overall Cognitive Status: Within Functional Limits for tasks assessed  Home Living Family/patient expects to be discharged to:: Private residence Living Arrangements: Spouse/significant other;Children Available Help at Discharge: Family;Available  PRN/intermittently Type of Home: House Home Access: Ramped entrance     Home Layout: One level     Bathroom Shower/Tub: Tub/shower unit         Home Equipment: Wheelchair - manual;Other (comment);Rollator (4 wheels);Lift chair   Additional Comments: Lives with spouse and daughter/family.      Prior Functioning/Environment Prior Level of Function : Needs assist;History of Falls (last six months)       Physical Assist : Mobility (physical);ADLs (physical) Mobility (physical): Bed mobility;Transfers;Gait;Stairs ADLs (physical): Bathing;Dressing;Toileting Mobility Comments: Primarily utilizes WC for mobility around house, but does not fit in bathroom. Pt able to ambulate from doorway>toilet with UE support from sink. Spends most of day in living room ADLs Comments: Daughter assists with bathing/dressing.Per pt, spouse provides limited assistance due to history of stroke. Pt uses wheelchair to get to bathroom door and furniture walks inside to toilet.        OT Problem List: Decreased strength;Decreased coordination;Pain;Decreased range of motion;Cardiopulmonary status limiting activity;Decreased activity tolerance;Decreased safety awareness;Impaired balance (sitting and/or standing);Decreased knowledge of use of DME or AE;Decreased knowledge of precautions;Impaired UE functional use      OT Treatment/Interventions: Self-care/ADL training;Therapeutic exercise;Therapeutic activities;Energy conservation;DME and/or AE instruction;Patient/family education;Balance training    OT Goals(Current goals can be found in the care plan section) Acute Rehab OT Goals Patient Stated Goal: to feel better and decrease pain OT Goal Formulation: With patient Time For Goal Achievement: 10/18/22 Potential to Achieve Goals: Fair ADL Goals Pt Will Perform Grooming: with min assist;sitting Pt Will Perform Upper Body Bathing: with min assist;sitting Pt Will Transfer to Toilet: with max assist;bedside  commode;squat pivot transfer  OT Frequency: Min 1X/week       AM-PAC OT "6 Clicks" Daily Activity     Outcome Measure Help from another person eating meals?: A Lot Help from another person taking care of personal grooming?: A Lot Help from another person toileting, which includes using toliet, bedpan, or urinal?: Total Help from another person bathing (including washing, rinsing, drying)?: Total Help from another person to put on and taking off regular upper body clothing?: Total Help from another person to put on and taking off regular lower body clothing?: Total 6 Click Score: 8   End of Session Nurse Communication: Mobility status  Activity Tolerance: Patient limited by pain Patient left: in bed  OT Visit Diagnosis: Unsteadiness on feet (R26.81);Repeated falls (R29.6);Muscle weakness (generalized) (M62.81)                Time: 6283-1517 OT Time Calculation (min): 25 min Charges:  OT General Charges $OT Visit: 1 Visit OT Evaluation $OT Eval Moderate Complexity: 1 Mod OT Treatments $Self Care/Home Management : 8-22 mins  Jackquline Denmark, MS, OTR/L , CBIS ascom [phone]  10/04/22, 2:12 PM

## 2022-10-04 NOTE — Progress Notes (Signed)
Progress Note   Patient: Tina Pacheco ZOX:[id number] DOB: 24-Sep-1950 DOA: 10/02/2022     2 DOS: the patient was seen and examined on 10/04/2022   Brief hospital course: Ms. Antoinette Finkler is a 72 year old female with history of rheumatoid arthritis, CAD, obstructive sleep apnea, dCHF, morbid obesity, paroxysmal atrial fibrillation on anticoagulation, who presented to the emergency department for chief concerns of worsening left shoulder pain after a fall.    Assessment and Plan: Shoulder dislocation, left, initial encounter Closed reduction of the shoulder per EDP and was unsuccessful. Status post OR closed reduction under anesthesia by orthopedic service, also unsuccessful. S/p Left Reverse shoulder arthroplasty 10/03/22. Ortho follow up appreciated. Tolerated procedure well. Continue pain control. PT/ OT follow up suggested SNF.  Chronic obstructive pulmonary disease (HCC) No exacerbation. Continue home inhalers. Supplemental oxygen as needed.  Chronic diastolic CHF Paroxysmal Afib- HR, BP stable.  Will resume her home diuretic regimen. Metoprolol home dose nightly started. Restarted Eliquis from tonight.  CKD stage 3b- Stable. Will resume home meds. Monitor daily renal function.   At risk for falling Fall precautions, PT, OT follow up. Patient lives at home with her husband who has stroke. Daughter and son in law helps her. She will need SNF. TOC on board.   Sleep apnea, unspecified CPAP nightly ordered   Morbid obesity (HCC) This complicates overall care and prognosis.  Dietician evaluation.   Chronic pain syndrome on chronic opioids Continue home regimen Pain management per ortho for acute pain. Hold pain meds if more lethargic and sleepy.   Insomnia Melatonin 5 mg nightly as needed for sleep.      Subjective: Patient is seen and examined today morning. She denies any pain at surgery site. Worked with PT. Eating fair, energy poor.  Physical Exam: Vitals:    10/03/22 2019 10/04/22 0056 10/04/22 0500 10/04/22 0726  BP: (!) 134/59 (!) 149/73 (!) 148/71 (!) 117/56  Pulse: 79 64 70 73  Resp: 20 14 15 16   Temp: 97.7 F (36.5 C) (!) 97.5 F (36.4 C) (!) 97.5 F (36.4 C) 97.9 F (36.6 C)  TempSrc:   Oral   SpO2: 91% 95% 96% 100%  Weight:      Height:       General - Elderly ill Caucasian female, no apparent distress HEENT - PERRLA, EOMI, atraumatic head, non tender sinuses. Lung - Clear, rales, rhonchi, wheezes. Heart - S1, S2 heard, no murmurs, rubs, trace pedal edema Neuro - Alert, awake and oriented x 3, non focal exam. Skin - Warm and dry. Left shoulder immobilizer, cold pack system noted. Data Reviewed:     Latest Ref Rng & Units 10/04/2022    4:23 AM 10/03/2022    4:55 AM 10/02/2022    4:48 PM  CBC  WBC 4.0 - 10.5 K/uL 6.6  7.3  7.7   Hemoglobin 12.0 - 15.0 g/dL 9.3  9.5  81.1   Hematocrit 36.0 - 46.0 % 29.8  30.7  32.8   Platelets 150 - 400 K/uL 192  219  240       Latest Ref Rng & Units 10/04/2022    4:23 AM 10/03/2022    4:55 AM 10/02/2022    4:48 PM  BMP  Glucose 70 - 99 mg/dL 914  70  93   BUN 8 - 23 mg/dL 28  32  35   Creatinine 0.44 - 1.00 mg/dL 7.82  9.56  2.13   Sodium 135 - 145 mmol/L 139  139  136  Potassium 3.5 - 5.1 mmol/L 4.9  4.2  4.0   Chloride 98 - 111 mmol/L 107  105  101   CO2 22 - 32 mmol/L 22  25  25    Calcium 8.9 - 10.3 mg/dL 9.2  9.3  9.8    DG Shoulder Left Port  Result Date: 10/03/2022 CLINICAL DATA:  Status post reverse total left shoulder arthroplasty. Postoperative. EXAM: LEFT SHOULDER COMPARISON:  Left shoulder radiograph 10/02/2022 (multiple studies including intraoperative attempts to relocated glenohumeral joint). FINDINGS: Interval reverse total left shoulder arthroplasty. Oblique frontal and transscapular Y-views were performed. The technologist notes the best possible images were performed within patient ability. The reversal left shoulder arthroplasty hardware appears normally aligned. Anterior  surgical skin staples are noted. No perihardware lucency is seen to indicate hardware failure or loosening. IMPRESSION: Interval reverse total left shoulder arthroplasty. No evidence of hardware failure or loosening. Electronically Signed   By: Neita Garnet M.D.   On: 10/03/2022 20:00   [place] OR NERVE BLOCK-IMAGE ONLY [facility])  Result Date: 10/03/2022 There is no interpretation for this exam.  This order is for images obtained during a surgical procedure.  Please See "Surgeries" Tab for more information regarding the procedure.   CT SHOULDER LEFT WO CONTRAST  Result Date: 10/02/2022 CLINICAL DATA:  Shoulder pain, chronic, instability suspected, xray done EXAM: CT OF THE UPPER LEFT EXTREMITY WITHOUT CONTRAST TECHNIQUE: Multidetector CT imaging of the upper left extremity was performed according to the standard protocol. RADIATION DOSE REDUCTION: This exam was performed according to the departmental dose-optimization program which includes automated exposure control, adjustment of the mA and/or kV according to patient size and/or use of iterative reconstruction technique. COMPARISON:  Shoulder x-ray earlier today FINDINGS: Bones/Joint/Cartilage Continued anterior dislocation of the left glenohumeral joint. There is a fracture fragment noted along the anterior aspect of the glenoid, likely Bankart fracture. Left shoulder joint effusion. Large fluid collection lateral to the humeral head and proximal shaft likely within overlying bursa. Ligaments Suboptimally assessed by CT. Muscles and Tendons Grossly unremarkable. Soft tissues Negative IMPRESSION: Continued anterior dislocation of the left glenohumeral joint with Bankart fracture. Associated moderate joint effusion. Fluid collection lateral to the humeral head and proximal shaft likely fluid within the overlying bursa. Electronically Signed   By: Charlett Nose M.D.   On: 10/02/2022 20:36   DG Shoulder Left  Result Date: 10/02/2022 CLINICAL DATA:  Elective surgery.  Multiple attempts of closed reduction in the OR under anesthesia. EXAM: LEFT SHOULDER - 2+ VIEW COMPARISON:  Radiographs earlier today. FINDINGS: Multiple AP and axial views of the left shoulder obtained in the operating room during attempted reduction shoulder dislocation. Majority of these demonstrate persistent anterior shoulder dislocation. Final attempt demonstrates improved glenohumeral alignment, although humerus appears to be persistently anteriorly dislocated on the axial view. Suspect glenoid fracture, lower not well-defined. IMPRESSION: Persistent anterior shoulder dislocation. Final attempt demonstrates improved glenohumeral alignment, although humerus appears to be persistently anteriorly dislocated on the axial view. Electronically Signed   By: Narda Rutherford M.D.   On: 10/02/2022 18:17   DG Shoulder 1V Left  Result Date: 10/02/2022 CLINICAL DATA:  Dislocation of left shoulder, postreduction view EXAM: LEFT SHOULDER COMPARISON:  Study done earlier today FINDINGS: Single AP view of the left shoulder shows persistent anterior dislocation. IMPRESSION: Persistent anterior dislocation. Electronically Signed   By: Ernie Avena M.D.   On: 10/02/2022 16:12   DG Shoulder 1V Left  Result Date: 10/02/2022 CLINICAL DATA:  Pain EXAM: LEFT SHOULDER one-view COMPARISON:  Earlier 10/02/2022 FINDINGS: Anterior shoulder dislocation still present. Postreduction. Imaging obtained to aid in treatment IMPRESSION: Persistent anterior shoulder dislocation Electronically Signed   By: Karen Kays M.D.   On: 10/02/2022 16:07     Family Communication: Discussed with daughter regarding her current care plan and discharge plan.  Disposition: Status is: Inpatient Remains inpatient appropriate because: Post left shoulder arthroplasty, need SNF placement.  Planned Discharge Destination: Skilled nursing facility    Time spent: 44 minutes  Author: Marcelino Duster, MD 10/04/2022 3:15 PM  For on call  review [email or web address].

## 2022-10-04 NOTE — Sedation Documentation (Signed)
MD removed BP cuff and cardiac leads before starting reduction- unable to monitor or chart per policy

## 2022-10-05 DIAGNOSIS — S43005A Unspecified dislocation of left shoulder joint, initial encounter: Secondary | ICD-10-CM | POA: Diagnosis not present

## 2022-10-05 DIAGNOSIS — N1832 Chronic kidney disease, stage 3b: Secondary | ICD-10-CM | POA: Diagnosis not present

## 2022-10-05 DIAGNOSIS — I480 Paroxysmal atrial fibrillation: Secondary | ICD-10-CM | POA: Diagnosis not present

## 2022-10-05 DIAGNOSIS — I25119 Atherosclerotic heart disease of native coronary artery with unspecified angina pectoris: Secondary | ICD-10-CM | POA: Diagnosis not present

## 2022-10-05 MED ORDER — BUPIVACAINE HCL (PF) 0.5 % IJ SOLN
INTRAMUSCULAR | Status: DC | PRN
  Administered 2022-10-03: 10 mL

## 2022-10-05 MED ORDER — AMIODARONE HCL 200 MG PO TABS
200.0000 mg | ORAL_TABLET | Freq: Every day | ORAL | Status: DC
  Administered 2022-10-05 – 2022-10-06 (×2): 200 mg via ORAL
  Filled 2022-10-05 (×2): qty 1

## 2022-10-05 MED ORDER — PREDNISONE 10 MG PO TABS
5.0000 mg | ORAL_TABLET | Freq: Every day | ORAL | Status: DC
  Administered 2022-10-05: 5 mg via ORAL
  Filled 2022-10-05: qty 1

## 2022-10-05 MED ORDER — TERAZOSIN HCL 5 MG PO CAPS
10.0000 mg | ORAL_CAPSULE | Freq: Every day | ORAL | Status: DC
  Administered 2022-10-05: 10 mg via ORAL
  Filled 2022-10-05: qty 2

## 2022-10-05 MED ORDER — BUPIVACAINE LIPOSOME 1.3 % IJ SUSP
INTRAMUSCULAR | Status: DC | PRN
  Administered 2022-10-03: 20 mL

## 2022-10-05 MED ORDER — LORATADINE 10 MG PO TABS
10.0000 mg | ORAL_TABLET | Freq: Every day | ORAL | Status: DC
  Administered 2022-10-05 – 2022-10-06 (×2): 10 mg via ORAL
  Filled 2022-10-05 (×2): qty 1

## 2022-10-05 NOTE — Addendum Note (Signed)
Addendum  created 10/05/22 1507 by Stephanie Coup, MD   Child order released for a procedure order, Clinical Note Signed, Intraprocedure Blocks edited, Intraprocedure Meds edited, SmartForm saved

## 2022-10-05 NOTE — Progress Notes (Signed)
Progress Note   Patient: Tina Pacheco NWG:[id number] DOB: 1951/01/31 DOA: 10/02/2022     3 DOS: the patient was seen and examined on 10/05/2022   Brief hospital course: Ms. Yensi Daughety is a 72 year old female with history of rheumatoid arthritis, CAD, obstructive sleep apnea, dCHF, morbid obesity, paroxysmal atrial fibrillation on anticoagulation, who presented to the emergency department for chief concerns of worsening left shoulder pain after a fall.  Patient had unsuccessful closed reduction and OR closed reduction. She had left reverse shoulder arthroplasty. Ortho on board. PT/ OT suggested SNF placement.  Assessment and Plan: Shoulder dislocation, left, initial encounter Closed reduction of the shoulder per EDP and was unsuccessful. Status post OR closed reduction under anesthesia by orthopedic service, also unsuccessful. S/p Left Reverse shoulder arthroplasty 10/03/22. Ortho follow up appreciated. Tolerated procedure well. Continue pain control. PT/ OT follow up. TOC working on SNF placement.  Chronic obstructive pulmonary disease (HCC) No exacerbation. Continue home inhalers. Supplemental oxygen as needed.  Chronic diastolic CHF Paroxysmal Afib- HR, BP stable.  Will resume her home diuretic regimen. Metoprolol home dose nightly started. Restarted Eliquis from tonight.  CKD stage 3b- Stable. Avoid nephrotoxic drugs.   At risk for falling Fall precautions, PT, OT follow up. Patient lives at home with her husband who has stroke. Daughter and son in law helps her. She will need SNF. TOC on board.   Sleep apnea, unspecified CPAP nightly ordered   Morbid obesity (HCC) This complicates overall care and prognosis.  Dietician evaluation.   Chronic pain syndrome on chronic opioids Continue home regimen Pain management per ortho for acute pain. Hold pain meds if more lethargic and sleepy.   Insomnia Melatonin 5 mg nightly as needed for sleep.      Subjective: Patient  is seen and examined today morning. She has some soreness at surgery site. Eating poor. Has stuffiness due to allergies, asking for claritin. Eating fair. Advised to work with PT, encouraged incentive spirometry.  Physical Exam: Vitals:   10/04/22 1544 10/04/22 2206 10/05/22 0928 10/05/22 0935  BP: (!) 144/75 (!) 161/79 (!) 172/83   Pulse: 77 72 65 61  Resp: 16 19 15    Temp: 98.3 F (36.8 C) 98.1 F (36.7 C) 98.3 F (36.8 C)   TempSrc:  Oral    SpO2: 95% 100% 100% 97%  Weight:      Height:       General - Elderly ill Caucasian female, no apparent distress HEENT - PERRLA, EOMI, atraumatic head, left eyelid swelling. Lung - Clear, rales, rhonchi, wheezes. Heart - S1, S2 heard, no murmurs, rubs, trace pedal edema Neuro - Alert, awake and oriented x 3, non focal exam. Skin - Warm and dry. Left shoulder immobilizer, cold pack system noted. Data Reviewed:     Latest Ref Rng & Units 10/04/2022    4:23 AM 10/03/2022    4:55 AM 10/02/2022    4:48 PM  CBC  WBC 4.0 - 10.5 K/uL 6.6  7.3  7.7   Hemoglobin 12.0 - 15.0 g/dL 9.3  9.5  57.8   Hematocrit 36.0 - 46.0 % 29.8  30.7  32.8   Platelets 150 - 400 K/uL 192  219  240       Latest Ref Rng & Units 10/05/2022    3:10 AM 10/04/2022    4:23 AM 10/03/2022    4:55 AM  BMP  Glucose 70 - 99 mg/dL 469  629  70   BUN 8 - 23 mg/dL 31  28  32   Creatinine 0.44 - 1.00 mg/dL 9.56  3.87  5.64   Sodium 135 - 145 mmol/L 137  139  139   Potassium 3.5 - 5.1 mmol/L 5.1  4.9  4.2   Chloride 98 - 111 mmol/L 102  107  105   CO2 22 - 32 mmol/L 26  22  25    Calcium 8.9 - 10.3 mg/dL 9.6  9.2  9.3    DG Shoulder Left Port  Result Date: 10/03/2022 CLINICAL DATA:  Status post reverse total left shoulder arthroplasty. Postoperative. EXAM: LEFT SHOULDER COMPARISON:  Left shoulder radiograph 10/02/2022 (multiple studies including intraoperative attempts to relocated glenohumeral joint). FINDINGS: Interval reverse total left shoulder arthroplasty. Oblique frontal and  transscapular Y-views were performed. The technologist notes the best possible images were performed within patient ability. The reversal left shoulder arthroplasty hardware appears normally aligned. Anterior surgical skin staples are noted. No perihardware lucency is seen to indicate hardware failure or loosening. IMPRESSION: Interval reverse total left shoulder arthroplasty. No evidence of hardware failure or loosening. Electronically Signed   By: Neita Garnet M.D.   On: 10/03/2022 20:00   [place] OR NERVE BLOCK-IMAGE ONLY [facility])  Result Date: 10/03/2022 There is no interpretation for this exam.  This order is for images obtained during a surgical procedure.  Please See "Surgeries" Tab for more information regarding the procedure.     Family Communication: Discussed with daughter yesterday regarding her current care plan and discharge plan.  Disposition: Status is: Inpatient Remains inpatient appropriate because: Post left shoulder arthroplasty, need SNF placement.  Planned Discharge Destination: Skilled nursing facility    Time spent: 44 minutes  Author: Marcelino Duster, MD 10/05/2022 12:16 PM  For on call review [email or web address].

## 2022-10-05 NOTE — Care Management Important Message (Signed)
Important Message  Patient Details  Name: Tina Pacheco MRN: [id number] Date of Birth: 1950/07/10   Medicare Important Message Given:  N/A - LOS <3 / Initial given by admissions     Olegario Messier A  10/05/2022, 8:27 AM

## 2022-10-05 NOTE — TOC Progression Note (Signed)
Transition of Care [facility]) - Progression Note    Patient Details  Name: Tina Pacheco MRN: [id number] Date of Birth: 1950-07-02  Transition of Care [facility]) CM/SW Contact  Darleene Cleaver, [place] Phone Number: 10/05/2022, 12:59 PM  Clinical Narrative:    Patient has been faxed out to SNFs awaiting bed offers.   Expected Discharge Plan: Skilled Nursing Facility Barriers to Discharge: Continued Medical Work up  Expected Discharge Plan and Services In-house Referral: Clinical Social Work   Post Acute Care Choice: Skilled Nursing Facility Living arrangements for the past 2 months: Single Family Home                                       Social Determinants of Health (SDOH) Interventions SDOH Screenings   Food Insecurity: No Food Insecurity (10/02/2022)  Housing: Low Risk  (10/02/2022)  Transportation Needs: No Transportation Needs (10/02/2022)  Utilities: Not At Risk (10/02/2022)  Depression (PHQ2-9): High Risk (04/14/2022)  Financial Resource Strain: Low Risk  (06/19/2022)  Physical Activity: Unknown (06/19/2022)  Social Connections: Moderately Isolated (06/19/2022)  Stress: No Stress Concern Present (06/19/2022)  Tobacco Use: Low Risk  (10/03/2022)    Readmission Risk Interventions    04/22/2021    4:08 PM 11/14/2020    3:54 PM 06/01/2020   12:44 PM  Readmission Risk Prevention Plan  Transportation Screening Complete Complete Complete  PCP or Specialist Appt within 3-5 Days   Complete  HRI or Home Care Consult   Complete  Social Work Consult for Recovery Care Planning/Counseling   Complete  Palliative Care Screening   Not Applicable  Medication Review Oceanographer) Complete Complete --  PCP or Specialist appointment within 3-5 days of discharge Complete Complete   HRI or Home Care Consult Complete --   SW Recovery Care/Counseling Consult Not Complete Complete   SW Consult Not Complete Comments RNCM assigned to patient    Palliative Care Screening Not Applicable Not  Applicable   Skilled Nursing Facility Not Applicable Complete

## 2022-10-05 NOTE — Plan of Care (Signed)

## 2022-10-05 NOTE — Progress Notes (Addendum)
Physical Therapy Treatment Patient Details Name: Tina Pacheco MRN: [id number] DOB: [birth year] Today's Date: 10/05/2022   History of Present Illness Pt is a 72 y/o female admitted due to a fall resulting in L shoulder anterior dislocation. She is now s/p L reverse total shoulder arthroplasty as of 8/6. PMH includes R sh dislocation and reverse total shoulder revision (06/27/22), HLD, GERD, rheumatoid arthritis, CAD, OSA, obesity, Afib, COPD, chronic pain, heart failure, basal cell carcinoma, anemia, and falls.    PT Comments  Pt presents sitting EOB with OT, some pain in b/l shoulders and low back. Pt able to perform sit<>standx3 with min-modAx2 for task completion and to remain standing. Pt performed 2-3 lateral steps to the R with modAx2, noting difficulty with lifting RLE to initiate stepping. Pt required maxAx2 for sit>supine for trunk control, LE guidance, and maintain UE precautions. Pt would benefit from continued skilled therapy to maximize functional abilities.     If plan is discharge home, recommend the following: Direct supervision/assist for medications management;Help with stairs or ramp for entrance;Assist for transportation;Assistance with feeding;Assistance with cooking/housework;Direct supervision/assist for financial management;Two people to help with walking and/or transfers;A lot of help with bathing/dressing/bathroom   Can travel by private vehicle     No  Equipment Recommendations  None recommended by PT    Recommendations for Other Services       Precautions / Restrictions Precautions Precautions: Shoulder;Fall Shoulder Interventions: Shoulder abduction pillow;Don joy ultra sling;At all times;Off for dressing/bathing/exercises Precaution Booklet Issued: No Restrictions Weight Bearing Restrictions: Yes RUE Weight Bearing: Non weight bearing LUE Weight Bearing: Non weight bearing Other Position/Activity Restrictions: Per secure chat with ortho, it is okay for pt to  utilize R UE for self care tasks but minimize weight bearing.     Mobility  Bed Mobility           Sit to supine: Max assist, +2 for physical assistance   General bed mobility comments: maxA for trunk control, LE guidance, and maintain pt precautions    Transfers Overall transfer level: Needs assistance Equipment used: None Transfers: Sit to/from Stand Sit to Stand: +2 physical assistance, From elevated surface, Min assist, Mod assist           General transfer comment: assist for completion of task and maintain standing    Ambulation/Gait                   Stairs             Wheelchair Mobility     Tilt Bed    Modified Rankin (Stroke Patients Only)       Balance Overall balance assessment: Needs assistance Sitting-balance support: No upper extremity supported, Feet supported Sitting balance-Leahy Scale: Good     Standing balance support: No upper extremity supported Standing balance-Leahy Scale: Poor Standing balance comment: Pt reliance on PT assist to maintain standing position                            Cognition Arousal: Alert Behavior During Therapy: WFL for tasks assessed/performed Overall Cognitive Status: Within Functional Limits for tasks assessed                                          Exercises Other Exercises Other Exercises: STS x3 with min-modAx2 from PT Other Exercises: 2 lateral steps to  R modAx2    General Comments        Pertinent Vitals/Pain Pain Assessment Pain Assessment: Faces Faces Pain Scale: Hurts little more Pain Location: Shoulders/ low back Pain Intervention(s): Monitored during session    Home Living                          Prior Function            PT Goals (current goals can now be found in the care plan section) Acute Rehab PT Goals Patient Stated Goal: return home PT Goal Formulation: With patient Time For Goal Achievement: 10/18/22 Potential  to Achieve Goals: Fair Progress towards PT goals: Progressing toward goals    Frequency    7X/week      PT Plan      Co-evaluation              AM-PAC PT "6 Clicks" Mobility   Outcome Measure  Help needed turning from your back to your side while in a flat bed without using bedrails?: A Lot Help needed moving from lying on your back to sitting on the side of a flat bed without using bedrails?: A Lot Help needed moving to and from a bed to a chair (including a wheelchair)?: A Lot Help needed standing up from a chair using your arms (e.g., wheelchair or bedside chair)?: A Lot Help needed to walk in hospital room?: A Lot Help needed climbing 3-5 steps with a railing? : Total 6 Click Score: 11    End of Session   Activity Tolerance: Patient tolerated treatment well Patient left: in bed;with call bell/phone within reach;with bed alarm set;with nursing/sitter in room   PT Visit Diagnosis: Other abnormalities of gait and mobility (R26.89);Repeated falls (R29.6);History of falling (Z91.81);Muscle weakness (generalized) (M62.81);Difficulty in walking, not elsewhere classified (R26.2)     Time: 1610-9604 PT Time Calculation (min) (ACUTE ONLY): 18 min  Charges:    $Therapeutic Activity: 8-22 mins PT General Charges $$ ACUTE PT VISIT: 1 Visit                      , PT, SPT 12:04 PM,10/05/22

## 2022-10-05 NOTE — Progress Notes (Signed)
Subjective: 2 Days Post-Op Procedure(s) (LRB): REVERSE SHOULDER ARTHROPLASTY (Left) Patient reports pain as mild.   Patient is well, and has had no acute complaints or problems Denies any CP, SOB, ABD pain. We will start therapy today.    Objective: Vital signs in last 24 hours: Temp:  [98.1 F (36.7 C)-98.3 F (36.8 C)] 98.1 F (36.7 C) (08/07 2206) Pulse Rate:  [72-77] 72 (08/07 2206) Resp:  [16-19] 19 (08/07 2206) BP: (144-161)/(75-79) 161/79 (08/07 2206) SpO2:  [95 %-100 %] 100 % (08/07 2206)  Intake/Output from previous day: 08/07 0701 - 08/08 0700 In: 0  Out: 1800 [Urine:1800] Intake/Output this shift: No intake/output data recorded.  Recent Labs    10/02/22 1648 10/03/22 0455 10/04/22 0423  HGB 10.2* 9.5* 9.3*   Recent Labs    10/03/22 0455 10/04/22 0423  WBC 7.3 6.6  RBC 3.44* 3.31*  HCT 30.7* 29.8*  PLT 219 192   Recent Labs    10/04/22 0423 10/05/22 0310  NA 139 137  K 4.9 5.1  CL 107 102  CO2 22 26  BUN 28* 31*  CREATININE 1.61* 1.50*  GLUCOSE 120* 106*  CALCIUM 9.2 9.6   No results for input(s): "LABPT", "INR" in the last 72 hours.  EXAM General - Patient is Alert, Appropriate, and Oriented Left Upper Extremity - Neurovascular intact Sensation intact distally Intact pulses distally Full composite fist LUE Sling intact Dressing - scant drainage   Past Medical History:  Diagnosis Date   (HFpEF) heart failure with preserved ejection fraction (HCC)    a.) TTE 08/10/2014: EF 55-60%, mild MAC, mild LAE, mild MR, PASP 38; b.) TTE 03/31/2019: EF 55-60%, mild LVH, mild-mod LAW, mod MAC, mild-mod AoV sclerosis, triv PR, G1DD; c.) TTE 05/29/2020: EF 60-65%, mild LVH, mod LAE, G1DD   Anemia    Aortic atherosclerosis (HCC)    Asthma    Basal cell carcinoma    CAD (coronary artery disease)    a.) LHC 05/24/2010 - nonobstructive CAD - med mgmt; b.) LHC 06/04/2020: 30% dLAD, 40% o-mLAD - med mgmt   Chronic, continuous use of opioids     Claustrophobia    COPD (chronic obstructive pulmonary disease) (HCC)    DDD (degenerative disc disease), lumbosacral    Diuretic-induced hypokalemia    Dyslipidemia    Fibromyalgia    Frequent falls    GERD (gastroesophageal reflux disease)    Gouty arthropathy    Hemihypertrophy    History of bilateral cataract extraction 2018   Hyperplastic colonic polyp 2003   Hypertension    Long term current use of amiodarone    Long term current use of anticoagulant    a.) apixaban   Long term current use of immunosuppressive drug    a.) certolizumab pegol  + predisone for RA diagnosis   MDD (major depressive disorder)    Migraines    Morbid obesity (HCC)    Nontraumatic complete tear of rotator cuff, left    OSA (obstructive sleep apnea)    a.) non-complaint with prescribed nocturnal PAP therapy   Osteoarthritis    PAF (paroxysmal atrial fibrillation) (HCC)    a.) CHA2DS2-VASc = 5 (age, sex, CHF, HTN, vascular disease history); b.) rate and rhythm maintained on oral amiodarone + metoprolol succinate; chronically anticoagulated with standard dose apixaban   Pneumonia    PONV (postoperative nausea and vomiting)    Prediabetes    Rheumatoid arthritis (HCC)    a.) on certolizumab pegol + predisone   Stage 3  chronic kidney disease (HCC)     Assessment/Plan:   2 Days Post-Op Procedure(s) (LRB): REVERSE SHOULDER ARTHROPLASTY (Left) Principal Problem:   Shoulder dislocation, left, initial encounter Active Problems:   HTN (hypertension)   CAD (coronary artery disease)   Rheumatoid arthritis (HCC)   Insomnia   Chronic pain syndrome   RLS (restless legs syndrome)   Paroxysmal A-fib (HCC)   Chronic diastolic CHF (congestive heart failure) (HCC)   Polymyalgia rheumatica (HCC)   Stage 3b chronic kidney disease (HCC)   At risk for falling   Morbid obesity (HCC)   Sleep apnea, unspecified   Chronic obstructive pulmonary disease (HCC)   Mixed hyperlipidemia   Rheumatoid arthritis,  unspecified (HCC)   Traumatic closed displaced fracture of left shoulder with anterior dislocation  Estimated body mass index is 46.75 kg/m as calculated from the following:   Height as of this encounter: 5\' 3"  (1.6 m).   Weight as of this encounter: 119.7 kg. Advance diet Up with therapy Pain controlled Labs and VSS CM to assist with discharge   DVT Prophylaxis - Eliquis    T. Cranston Neighbor, PA-C [facility] 10/05/2022, 7:40 AM

## 2022-10-05 NOTE — Progress Notes (Signed)
Occupational Therapy Treatment Patient Details Name: Tina Pacheco MRN: [id number] DOB: 10-20-1950 Today's Date: 10/05/2022   History of present illness Pt is a 72 y/o female admitted due to a fall resulting in L shoulder anterior dislocation. She is now s/p L reverse total shoulder arthroplasty as of 8/6. PMH includes R sh dislocation and reverse total shoulder revision (06/27/22), HLD, GERD, rheumatoid arthritis, CAD, OSA, obesity, Afib, COPD, chronic pain, heart failure, basal cell carcinoma, anemia, and falls.   OT comments  Pt seen for OT tx. Pt endorsing  having back pain and notes that her throat feels sore. RN notified. Pt required MOD-MAX A +2 for sup>sit EOB, limited in ability to assist due to NWBing BUE but able to use legs and trunk to help her scoot slowly to improve seated position EOB. Pt tolerated sitting EOB without support for approx for grooming tasks and drinking from cup using R hand while maintaining very limited R shoulder movement. SpO2 97% on 2L (despite temporarily displacing nasal cannula for blowing her nose several times) and HR in low 60's. Pt left seated EOB with PT for additional therapy. Pt continues to benefit from skilled OT Services.       If plan is discharge home, recommend the following:  Two people to help with walking and/or transfers;Two people to help with bathing/dressing/bathroom;Assistance with cooking/housework;Assist for transportation;Help with stairs or ramp for entrance   Equipment Recommendations  Other (comment) (defer to next venue)    Recommendations for Other Services      Precautions / Restrictions Precautions Precautions: Shoulder;Fall Shoulder Interventions: Shoulder abduction pillow;Don joy ultra sling;At all times;Off for dressing/bathing/exercises Precaution Booklet Issued: No Restrictions Weight Bearing Restrictions: Yes RUE Weight Bearing: Non weight bearing LUE Weight Bearing: Non weight bearing Other  Position/Activity Restrictions: Per secure chat with ortho, it is okay for pt to utilize R UE for self care tasks but minimize weight bearing.       Mobility Bed Mobility Overal bed mobility: Needs Assistance Bed Mobility: Supine to Sit     Supine to sit: Mod assist, Max assist, +2 for physical assistance, HOB elevated     General bed mobility comments: Pt assists with scooting attempts to help transition to EOB    Transfers                         Balance Overall balance assessment: Needs assistance Sitting-balance support: No upper extremity supported, Feet supported Sitting balance-Leahy Scale: Good                                     ADL either performed or assessed with clinical judgement   ADL Overall ADL's : Needs assistance/impaired     Grooming: Sitting;Wash/dry face;Set up Grooming Details (indicate cue type and reason): washed face with R hand and very limited R shoulder movement                                    Extremity/Trunk Assessment              Vision       Perception     Praxis      Cognition Arousal: Alert Behavior During Therapy: WFL for tasks assessed/performed Overall Cognitive Status: Within Functional Limits for tasks assessed  Exercises Other Exercises Other Exercises: Pt tolerated sitting EOB without support for approx for grooming tasks and drinking from cup using R hand while maintaining very limited R shoulder movement. SpO2 97% on 2L (despite temporarily displacing nasal cannula for blowing her nose several times) and HR in low 60's.    Shoulder Instructions       General Comments      Pertinent Vitals/ Pain       Pain Assessment Pain Assessment: 0-10 Pain Score: 8  Pain Location: R shoulder, L shoulder, and chronic back pain Pain Descriptors / Indicators: Discomfort, Grimacing, Aching Pain Intervention(s):  Limited activity within patient's tolerance, Monitored during session, Repositioned, Patient requesting pain meds-RN notified, Ice applied  Home Living                                          Prior Functioning/Environment              Frequency  Min 1X/week        Progress Toward Goals  OT Goals(current goals can now be found in the care plan section)  Progress towards OT goals: Progressing toward goals  Acute Rehab OT Goals Patient Stated Goal: to feel better and decrease pain OT Goal Formulation: With patient Time For Goal Achievement: 10/18/22 Potential to Achieve Goals: Fair  Plan      Co-evaluation                 AM-PAC OT "6 Clicks" Daily Activity     Outcome Measure   Help from another person eating meals?: A Lot Help from another person taking care of personal grooming?: A Lot Help from another person toileting, which includes using toliet, bedpan, or urinal?: Total Help from another person bathing (including washing, rinsing, drying)?: Total Help from another person to put on and taking off regular upper body clothing?: Total Help from another person to put on and taking off regular lower body clothing?: Total 6 Click Score: 8    End of Session Equipment Utilized During Treatment: Oxygen  OT Visit Diagnosis: Unsteadiness on feet (R26.81);Repeated falls (R29.6);Muscle weakness (generalized) (M62.81)   Activity Tolerance Patient tolerated treatment well   Patient Left in bed;with call bell/phone within reach;Other (comment) (seated EOB with PT, polar care applied)   Nurse Communication Mobility status;Patient requests pain meds        Time: 1308-6578 OT Time Calculation (min): 26 min  Charges: OT General Charges $OT Visit: 1 Visit OT Treatments $Self Care/Home Management : 23-37 mins  Arman Filter., MPH, MS, OTR/L ascom [phone] 10/05/22, 9:41 AM

## 2022-10-05 NOTE — TOC Initial Note (Signed)
Transition of Care [facility]) - Initial/Assessment Note    Patient Details  Name: Tina Pacheco MRN: [id number] Date of Birth: Sep 18, 1950  Transition of Care [facility]) CM/SW Contact:    Darleene Cleaver, LCSW Phone Number: 10/05/2022, 12:43 PM  Clinical Narrative:                  Patient is a 72 year old female who is alert and oriented x4.  Patient is from home with her husband, and plans to return after short term rehab.  CSW spoke to patient and asked if she has been to rehab before, she stated that she has been to [facility], [facility], and [facility] in the past.  CSW asked if she had a preference, she stated she did not, just close to [place].  Patient also asked for CSW to speak to her daughter Trula Ore to discuss options.  Per Trula Ore, she said just somewhere close to [place].  CSW was given permission to begin bed search in [place].  Expected Discharge Plan: Skilled Nursing Facility Barriers to Discharge: Continued Medical Work up   Patient Goals and CMS Choice Patient states their goals for this hospitalization and ongoing recovery are:: To go to SNF for short term rehab then return back home with husband. CMS Medicare.gov Compare Post Acute Care list provided to:: Patient Choice offered to / list presented to : Patient, Adult Children [place] ownership interest in [facility]provided to:: Adult Children    Expected Discharge Plan and Services In-house Referral: Clinical Social Work   Post Acute Care Choice: Skilled Nursing Facility Living arrangements for the past 2 months: Single Family Home                                      Prior Living Arrangements/Services Living arrangements for the past 2 months: Single Family Home Lives with:: Spouse Patient language and need for interpreter reviewed:: Yes Do you feel safe going back to the place where you live?: No   Patient feels like she needs short term rehab prior to  returning back home.  Need for Family Participation in Patient Care: No (Comment) Care giver support system in place?: No (comment)   Criminal Activity/Legal Involvement Pertinent to Current Situation/Hospitalization: No - Comment as needed  Activities of Daily Living Home Assistive Devices/Equipment: Wheelchair, Grab bars around toilet, Grab bars in shower, Raised toilet seat with rails, Walker (specify type) (Rolator, hemi-walker) ADL Screening (condition at time of admission) Patient's cognitive ability adequate to safely complete daily activities?: Yes Is the patient deaf or have difficulty hearing?: Yes Does the patient have difficulty seeing, even when wearing glasses/contacts?: No Does the patient have difficulty concentrating, remembering, or making decisions?: No Patient able to express need for assistance with ADLs?: Yes Does the patient have difficulty dressing or bathing?: Yes Independently performs ADLs?: No Communication: Independent Dressing (OT): Needs assistance Is this a change from baseline?: Pre-admission baseline Grooming: Needs assistance Is this a change from baseline?: Pre-admission baseline Feeding: Independent Bathing: Needs assistance Is this a change from baseline?: Pre-admission baseline Toileting: Needs assistance Is this a change from baseline?: Pre-admission baseline In/Out Bed: Needs assistance Is this a change from baseline?: Pre-admission baseline Walks in Home: Needs assistance Is this a change from baseline?: Pre-admission baseline Does the patient have difficulty walking or climbing stairs?: Yes Weakness of Legs: Both Weakness of Arms/Hands: Both  Permission Sought/Granted Permission sought to share information with : Case Manager, Magazine features editor, Family Supports Permission granted to share information with : Yes, Verbal Permission Granted, Yes, Release of Information Signed  Share Information with NAME: Neely,Christina  Daughter [phone]  [phone], Jannetta, [place] Spouse [phone]  [phone]  Permission granted to share info w AGENCY: SNF admissions        Emotional Assessment Appearance:: Appears stated age Attitude/Demeanor/Rapport: Other (comment) (Pleasant and motivated to get better.) Affect (typically observed): Accepting, Appropriate, Calm, Pleasant, Stable Orientation: : Oriented to Self, Oriented to Place, Oriented to  Time, Oriented to Situation Alcohol / Substance Use: Not Applicable Psych Involvement: No (comment)  Admission diagnosis:  Shoulder dislocation, left, initial encounter [S43.005A] Acute pain of left shoulder [M25.512] Traumatic closed displaced fracture of left shoulder with anterior dislocation [S42.92XA] Patient Active Problem List   Diagnosis Date Noted   Traumatic closed displaced fracture of left shoulder with anterior dislocation 10/02/2022   Status post reverse arthroplasty of shoulder, right 06/27/2022   Eye swelling, right 04/14/2022   Secondary renal hyperparathyroidism (HCC) 12/28/2021   Rhabdomyolysis 09/02/2021   Hypokalemia 09/02/2021   Sinus bradycardia 09/02/2021   Morbid obesity (HCC) 07/08/2021   Constipation 07/08/2021   Skin rash 07/08/2021   Recurrent falls 04/21/2021   Tremor 03/23/2021   Sleep disturbance 03/23/2021   Nontraumatic complete tear of right rotator cuff 11/19/2020   Status post reverse total shoulder replacement, 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   Shoulder dislocation, left, initial encounter 11/10/2020   Stage 3b chronic kidney disease (HCC) 07/07/2020   Demand ischemia    Polymyalgia rheumatica (HCC) 02/03/2020   Mixed hyperlipidemia 04/04/2019   Rheumatoid arthritis, unspecified (HCC) 04/04/2019   Chronic diastolic CHF (congestive heart failure) (HCC)    At risk for falling 03/30/2019   Hypertensive heart disease with heart failure (HCC) 03/30/2019   Sleep apnea, unspecified 02/28/2019   Athscl heart disease of native coronary  artery w/o ang pctrs 02/28/2019   Chronic obstructive pulmonary disease (HCC) 02/28/2019   Gout, unspecified 02/28/2019   Chronic gouty arthropathy without tophi 04/01/2018   Preventative health care 03/20/2018   Mood disorder (HCC) 03/20/2018   Advance directive discussed with patient 03/20/2018   Narcotic dependence (HCC) 03/09/2017   Bronchiectasis without acute exacerbation (HCC) 06/15/2015   DDD (degenerative disc disease), lumbosacral 03/30/2015   Sleep apnea    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) 08/25/2014   Paroxysmal A-fib (HCC) 08/25/2014   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   RLS (restless legs syndrome) 07/06/2014   Chronic pain syndrome 05/26/2014   Migraine 04/08/2014   Nausea and vomiting 04/08/2014   Back pain 07/22/2013   Insomnia 07/15/2013   OA (osteoarthritis) 11/20/2012   DJD (degenerative joint disease) of cervical spine 09/10/2012   Epigastric pain 11/28/2011   Allergic rhinitis 07/10/2011   Rheumatoid arthritis (HCC) 03/06/2011   HTN (hypertension) 06/22/2010   CAD (coronary artery disease) 06/22/2010   Long term current use of anticoagulant therapy 05/27/2010   PCP:  Karie Schwalbe, MD Pharmacy:   [id number] - [place], [place] - [address] AT [facility] [address] [place] [address] Phone: [phone] Fax: [phone]  [facility] - [place], [place] - [address] AT [facility] [address] [place] [address] Phone: [phone] Fax: [phone]  [facility] - [place], [place] - [address] [place] [address] Phone: [phone] Fax: [phone]     Social Determinants of Health (SDOH) Social History: SDOH Screenings   Food Insecurity: No Food Insecurity (10/02/2022)  Housing: Low  Risk  (10/02/2022)  Transportation Needs: No Transportation Needs (10/02/2022)  Utilities: Not At Risk (10/02/2022)  Depression (PHQ2-9): High Risk (04/14/2022)  Financial Resource Strain: Low Risk  (06/19/2022)  Physical Activity: Unknown (06/19/2022)  Social Connections: Moderately Isolated (06/19/2022)  Stress: No Stress Concern Present (06/19/2022)  Tobacco Use: Low Risk  (10/03/2022)   SDOH Interventions:     Readmission Risk Interventions    04/22/2021    4:08 PM 11/14/2020    3:54 PM 06/01/2020   12:44 PM  Readmission Risk Prevention Plan  Transportation Screening Complete Complete Complete  PCP or Specialist Appt within 3-5 Days   Complete  HRI or Home Care Consult   Complete  Social Work Consult for Recovery Care Planning/Counseling   Complete  Palliative Care Screening   Not Applicable  Medication Review Oceanographer) Complete Complete --  PCP or Specialist appointment within 3-5 days of discharge Complete Complete   HRI or Home Care Consult Complete --   SW Recovery Care/Counseling Consult Not Complete Complete   SW Consult Not Complete Comments RNCM assigned to patient    Palliative Care Screening Not Applicable Not Applicable   Skilled Nursing Facility Not Applicable Complete

## 2022-10-05 NOTE — Plan of Care (Signed)
  Problem: Education: Goal: Knowledge of General Education information will improve Description: Including pain rating scale, medication(s)/side effects and non-pharmacologic comfort measures Outcome: Progressing   Problem: Health Behavior/Discharge Planning: Goal: Ability to manage health-related needs will improve Outcome: Progressing   Problem: Clinical Measurements: Goal: Ability to maintain clinical measurements within normal limits will improve Outcome: Progressing   Problem: Nutrition: Goal: Adequate nutrition will be maintained Outcome: Progressing   Problem: Coping: Goal: Level of anxiety will decrease Outcome: Progressing   Problem: Safety: Goal: Ability to remain free from injury will improve Outcome: Progressing   

## 2022-10-05 NOTE — Anesthesia Procedure Notes (Signed)
Anesthesia Regional Block: Interscalene brachial plexus block   Pre-Anesthetic Checklist: , timeout performed,  Correct Patient, Correct Site, Correct Laterality,  Correct Procedure, Correct Position, site marked,  Risks and benefits discussed,  Surgical consent,  Pre-op evaluation,  At surgeon's request and post-op pain management  Laterality: Left  Prep: chloraprep       Needles:  Injection technique: Single-shot  Needle Type: Echogenic Needle     Needle Length: 4cm  Needle Gauge: 25     Additional Needles:   Procedures:,,,, ultrasound used (permanent image in chart),,    Narrative:  Start time: 10/03/2022 2:55 PM End time: 10/03/2022 2:57 PM Injection made incrementally with aspirations every 5 mL.  Performed by: Personally  Anesthesiologist: Stephanie Coup, MD  Additional Notes: Patient's chart reviewed and they were deemed appropriate candidate for procedure, at surgeon's request. Patient educated about risks, benefits, and alternatives of the block including but not limited to: temporary or permanent nerve damage, bleeding, infection, damage to surround tissues, pneumothorax, hemidiaphragmatic paralysis, unilateral Horner's syndrome, block failure, local anesthetic toxicity. Patient expressed understanding. A formal time-out was conducted consistent with institution rules.  Monitors were applied, and minimal sedation used (see nursing record). The site was prepped with skin prep and allowed to dry, and sterile gloves were used. A high frequency linear ultrasound probe with probe cover was utilized throughout. C5-7 nerve roots located and appeared anatomically normal, local anesthetic injected around them, and echogenic block needle trajectory was monitored throughout. Aspiration performed every 5ml. Lung and blood vessels were avoided. All injections were performed without resistance and free of blood and paresthesias. The patient tolerated the procedure well.  Injectate: 20ml  exparel + 10ml 0.5% bupivacaine

## 2022-10-05 NOTE — NC FL2 (Signed)
[place] MEDICAID FL2 LEVEL OF CARE FORM     IDENTIFICATION  Patient Name: Tina Pacheco Birthdate: 30-Aug-1950 Sex: female Admission Date (Current Location): 10/02/2022  [place] Number:  Chiropodist and Address:  [facility], [address], [place], [place] [address]      Provider Number: [id number]  Attending Physician Name and Address:  Marcelino Duster, MD  Relative Name and Phone Number:  Neely,Christina Daughter [phone]  [phone], Andre,Lucky J Spouse [phone]  [phone]    Current Level of Care: Hospital Recommended Level of Care: Skilled Nursing Facility Prior Approval Number:    Date Approved/Denied:   PASRR Number: [id number]  Discharge Plan: SNF    Current Diagnoses: Patient Active Problem List   Diagnosis Date Noted   Traumatic closed displaced fracture of left shoulder with anterior dislocation 10/02/2022   Status post reverse arthroplasty of shoulder, right 06/27/2022   Eye swelling, right 04/14/2022   Secondary renal hyperparathyroidism (HCC) 12/28/2021   Rhabdomyolysis 09/02/2021   Hypokalemia 09/02/2021   Sinus bradycardia 09/02/2021   Morbid obesity (HCC) 07/08/2021   Constipation 07/08/2021   Skin rash 07/08/2021   Recurrent falls 04/21/2021   Tremor 03/23/2021   Sleep disturbance 03/23/2021   Nontraumatic complete tear of right rotator cuff 11/19/2020   Status post reverse total shoulder replacement, 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   Shoulder dislocation, left, initial encounter 11/10/2020   Stage 3b chronic kidney disease (HCC) 07/07/2020   Demand ischemia    Polymyalgia rheumatica (HCC) 02/03/2020   Mixed hyperlipidemia 04/04/2019   Rheumatoid arthritis, unspecified (HCC) 04/04/2019   Chronic diastolic CHF (congestive heart failure) (HCC)    At risk for falling 03/30/2019   Hypertensive heart disease with heart failure (HCC) 03/30/2019   Sleep apnea, unspecified 02/28/2019    Athscl heart disease of native coronary artery w/o ang pctrs 02/28/2019   Chronic obstructive pulmonary disease (HCC) 02/28/2019   Gout, unspecified 02/28/2019   Chronic gouty arthropathy without tophi 04/01/2018   Preventative health care 03/20/2018   Mood disorder (HCC) 03/20/2018   Advance directive discussed with patient 03/20/2018   Narcotic dependence (HCC) 03/09/2017   Bronchiectasis without acute exacerbation (HCC) 06/15/2015   DDD (degenerative disc disease), lumbosacral 03/30/2015   Sleep apnea    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) 08/25/2014   Paroxysmal A-fib (HCC) 08/25/2014   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   RLS (restless legs syndrome) 07/06/2014   Chronic pain syndrome 05/26/2014   Migraine 04/08/2014   Nausea and vomiting 04/08/2014   Back pain 07/22/2013   Insomnia 07/15/2013   OA (osteoarthritis) 11/20/2012   DJD (degenerative joint disease) of cervical spine 09/10/2012   Epigastric pain 11/28/2011   Allergic rhinitis 07/10/2011   Rheumatoid arthritis (HCC) 03/06/2011   HTN (hypertension) 06/22/2010   CAD (coronary artery disease) 06/22/2010   Long term current use of anticoagulant therapy 05/27/2010    Orientation RESPIRATION BLADDER Height & Weight     Self, Time, Situation, Place  O2 (2L) Incontinent Weight: 263 lb 14.3 oz (119.7 kg) Height:  5\' 3"  (160 cm)  BEHAVIORAL SYMPTOMS/MOOD NEUROLOGICAL BOWEL NUTRITION STATUS      Continent Diet (Heart Healthy Carb modified)  AMBULATORY STATUS COMMUNICATION OF NEEDS Skin   Limited Assist Verbally PU Stage and Appropriate Care, Surgical wounds                       Personal Care Assistance Level of Assistance  Bathing, Feeding, Dressing Bathing Assistance: Limited assistance Feeding assistance: Independent Dressing Assistance: Limited assistance     Functional Limitations Info  Sight, Hearing, Speech Sight Info: Adequate Hearing  Info: Adequate Speech Info: Adequate    SPECIAL CARE FACTORS FREQUENCY  PT (By licensed PT), OT (By licensed OT)     PT Frequency: Minimum 5x a week OT Frequency: Minimum 5x a week            Contractures Contractures Info: Not present    Additional Factors Info  Code Status, Allergies Code Status Info: Full Code Allergies Info: Fluoxetine  Codeine           Current Medications (10/05/2022):  This is the current hospital active medication list Current Facility-Administered Medications  Medication Dose Route Frequency Provider Last Rate Last Admin   acetaminophen (TYLENOL) tablet 325-650 mg  325-650 mg Oral Q6H PRN Poggi, Excell Seltzer, MD       amiodarone (PACERONE) tablet 200 mg  200 mg Oral Daily Marcelino Duster, MD       apixaban (ELIQUIS) tablet 5 mg  5 mg Oral BID Marcelino Duster, MD   5 mg at 10/05/22 0947   benzonatate (TESSALON) capsule 100 mg  100 mg Oral BID PRN Poggi, Excell Seltzer, MD       bisacodyl (DULCOLAX) suppository 10 mg  10 mg Rectal Daily PRN Poggi, Excell Seltzer, MD       diphenhydrAMINE (BENADRYL) 12.5 MG/5ML elixir 12.5-25 mg  12.5-25 mg Oral Q4H PRN Poggi, Excell Seltzer, MD       docusate sodium (COLACE) capsule 100 mg  100 mg Oral BID Poggi, Excell Seltzer, MD   100 mg at 10/05/22 0947   hydrALAZINE (APRESOLINE) injection 5 mg  5 mg Intravenous Q6H PRN Poggi, Excell Seltzer, MD       HYDROcodone-acetaminophen (NORCO) 7.5-325 MG per tablet 1-2 tablet  1-2 tablet Oral Q4H PRN Poggi, Excell Seltzer, MD       HYDROcodone-acetaminophen (NORCO/VICODIN) 5-325 MG per tablet 1-2 tablet  1-2 tablet Oral Q4H PRN Poggi, Excell Seltzer, MD   2 tablet at 10/05/22 0947   ipratropium-albuterol (DUONEB) 0.5-2.5 (3) MG/3ML nebulizer solution 3 mL  3 mL Nebulization Q6H PRN Poggi, Excell Seltzer, MD       loratadine (CLARITIN) tablet 10 mg  10 mg Oral Daily Marcelino Duster, MD   10 mg at 10/05/22 1219   magnesium hydroxide (MILK OF MAGNESIA) suspension 30 mL  30 mL Oral Daily PRN Poggi, Excell Seltzer, MD   30 mL at 10/04/22  0519   melatonin tablet 5 mg  5 mg Oral QHS PRN Poggi, Excell Seltzer, MD       metoCLOPramide (REGLAN) tablet 5-10 mg  5-10 mg Oral Q8H PRN Poggi, Excell Seltzer, MD       Or   metoCLOPramide (REGLAN) injection 5-10 mg  5-10 mg Intravenous Q8H PRN Poggi, Excell Seltzer, MD       metoprolol succinate (TOPROL-XL) 24 hr tablet 50 mg  50 mg Oral QHS Marcelino Duster, MD   50 mg at 10/04/22 2220   montelukast (SINGULAIR) tablet 10 mg  10 mg Oral QHS Marcelino Duster, MD   10 mg at 10/04/22 2220   morphine (PF) 2 MG/ML injection 2-4 mg  2-4 mg Intravenous Q2H PRN Poggi, Excell Seltzer, MD       ondansetron [facility]) tablet 4 mg  4 mg Oral Q6H PRN Poggi, Excell Seltzer, MD       Or   ondansetron [facility]) injection  4 mg  4 mg Intravenous Q6H PRN Poggi, Excell Seltzer, MD       pantoprazole (PROTONIX) EC tablet 40 mg  40 mg Oral Daily Marcelino Duster, MD   40 mg at 10/05/22 0947   predniSONE (DELTASONE) tablet 5 mg  5 mg Oral QHS Sreeram, Narendranath, MD       senna-docusate (Senokot-S) tablet 1 tablet  1 tablet Oral QHS PRN Poggi, Excell Seltzer, MD   1 tablet at 10/04/22 0850   sodium phosphate (FLEET) 7-19 GM/118ML enema 1 enema  1 enema Rectal Once PRN Poggi, Excell Seltzer, MD       spironolactone (ALDACTONE) tablet 25 mg  25 mg Oral Daily Marcelino Duster, MD   25 mg at 10/05/22 0947   terazosin (HYTRIN) capsule 10 mg  10 mg Oral QHS Marcelino Duster, MD       torsemide (DEMADEX) tablet 20 mg  20 mg Oral Daily Marcelino Duster, MD   20 mg at 10/05/22 7829     Discharge Medications: Please see discharge summary for a list of discharge medications.  Relevant Imaging Results:  Relevant Lab Results:   Additional Information SSN [id number]  Darleene Cleaver, LCSW

## 2022-10-06 DIAGNOSIS — T84028S Dislocation of other internal joint prosthesis, sequela: Secondary | ICD-10-CM | POA: Diagnosis not present

## 2022-10-06 DIAGNOSIS — T8131XD Disruption of external operation (surgical) wound, not elsewhere classified, subsequent encounter: Secondary | ICD-10-CM | POA: Diagnosis not present

## 2022-10-06 DIAGNOSIS — Z23 Encounter for immunization: Secondary | ICD-10-CM | POA: Diagnosis not present

## 2022-10-06 DIAGNOSIS — L988 Other specified disorders of the skin and subcutaneous tissue: Secondary | ICD-10-CM | POA: Diagnosis not present

## 2022-10-06 DIAGNOSIS — R1312 Dysphagia, oropharyngeal phase: Secondary | ICD-10-CM | POA: Diagnosis not present

## 2022-10-06 DIAGNOSIS — S42121G Displaced fracture of acromial process, right shoulder, subsequent encounter for fracture with delayed healing: Secondary | ICD-10-CM | POA: Diagnosis not present

## 2022-10-06 DIAGNOSIS — Z743 Need for continuous supervision: Secondary | ICD-10-CM | POA: Diagnosis not present

## 2022-10-06 DIAGNOSIS — S43085A Other dislocation of left shoulder joint, initial encounter: Secondary | ICD-10-CM | POA: Diagnosis not present

## 2022-10-06 DIAGNOSIS — S4292XA Fracture of left shoulder girdle, part unspecified, initial encounter for closed fracture: Secondary | ICD-10-CM

## 2022-10-06 DIAGNOSIS — J441 Chronic obstructive pulmonary disease with (acute) exacerbation: Secondary | ICD-10-CM | POA: Diagnosis not present

## 2022-10-06 DIAGNOSIS — R278 Other lack of coordination: Secondary | ICD-10-CM | POA: Diagnosis not present

## 2022-10-06 DIAGNOSIS — R2681 Unsteadiness on feet: Secondary | ICD-10-CM | POA: Diagnosis not present

## 2022-10-06 DIAGNOSIS — Z9181 History of falling: Secondary | ICD-10-CM | POA: Diagnosis not present

## 2022-10-06 DIAGNOSIS — I480 Paroxysmal atrial fibrillation: Secondary | ICD-10-CM | POA: Diagnosis not present

## 2022-10-06 DIAGNOSIS — R296 Repeated falls: Secondary | ICD-10-CM | POA: Diagnosis not present

## 2022-10-06 DIAGNOSIS — S71102A Unspecified open wound, left thigh, initial encounter: Secondary | ICD-10-CM | POA: Diagnosis not present

## 2022-10-06 DIAGNOSIS — M069 Rheumatoid arthritis, unspecified: Secondary | ICD-10-CM

## 2022-10-06 DIAGNOSIS — Z4781 Encounter for orthopedic aftercare following surgical amputation: Secondary | ICD-10-CM | POA: Diagnosis not present

## 2022-10-06 DIAGNOSIS — S43005A Unspecified dislocation of left shoulder joint, initial encounter: Secondary | ICD-10-CM | POA: Diagnosis not present

## 2022-10-06 DIAGNOSIS — Z96612 Presence of left artificial shoulder joint: Secondary | ICD-10-CM | POA: Diagnosis not present

## 2022-10-06 DIAGNOSIS — N1832 Chronic kidney disease, stage 3b: Secondary | ICD-10-CM | POA: Diagnosis not present

## 2022-10-06 DIAGNOSIS — R1110 Vomiting, unspecified: Secondary | ICD-10-CM | POA: Diagnosis not present

## 2022-10-06 DIAGNOSIS — Z96611 Presence of right artificial shoulder joint: Secondary | ICD-10-CM | POA: Diagnosis not present

## 2022-10-06 DIAGNOSIS — I517 Cardiomegaly: Secondary | ICD-10-CM | POA: Diagnosis not present

## 2022-10-06 DIAGNOSIS — R279 Unspecified lack of coordination: Secondary | ICD-10-CM | POA: Diagnosis not present

## 2022-10-06 DIAGNOSIS — R41841 Cognitive communication deficit: Secondary | ICD-10-CM | POA: Diagnosis not present

## 2022-10-06 DIAGNOSIS — I130 Hypertensive heart and chronic kidney disease with heart failure and stage 1 through stage 4 chronic kidney disease, or unspecified chronic kidney disease: Secondary | ICD-10-CM | POA: Diagnosis not present

## 2022-10-06 DIAGNOSIS — R109 Unspecified abdominal pain: Secondary | ICD-10-CM | POA: Diagnosis not present

## 2022-10-06 DIAGNOSIS — M6281 Muscle weakness (generalized): Secondary | ICD-10-CM | POA: Diagnosis not present

## 2022-10-06 DIAGNOSIS — L24A2 Irritant contact dermatitis due to fecal, urinary or dual incontinence: Secondary | ICD-10-CM | POA: Diagnosis not present

## 2022-10-06 DIAGNOSIS — M6259 Muscle wasting and atrophy, not elsewhere classified, multiple sites: Secondary | ICD-10-CM | POA: Diagnosis not present

## 2022-10-06 DIAGNOSIS — D631 Anemia in chronic kidney disease: Secondary | ICD-10-CM | POA: Diagnosis not present

## 2022-10-06 DIAGNOSIS — I509 Heart failure, unspecified: Secondary | ICD-10-CM | POA: Diagnosis not present

## 2022-10-06 DIAGNOSIS — I5032 Chronic diastolic (congestive) heart failure: Secondary | ICD-10-CM | POA: Diagnosis not present

## 2022-10-06 DIAGNOSIS — I10 Essential (primary) hypertension: Secondary | ICD-10-CM | POA: Diagnosis not present

## 2022-10-06 DIAGNOSIS — S43015D Anterior dislocation of left humerus, subsequent encounter: Secondary | ICD-10-CM | POA: Diagnosis not present

## 2022-10-06 DIAGNOSIS — J449 Chronic obstructive pulmonary disease, unspecified: Secondary | ICD-10-CM | POA: Diagnosis not present

## 2022-10-06 DIAGNOSIS — Z515 Encounter for palliative care: Secondary | ICD-10-CM | POA: Diagnosis not present

## 2022-10-06 DIAGNOSIS — M0680 Other specified rheumatoid arthritis, unspecified site: Secondary | ICD-10-CM | POA: Diagnosis not present

## 2022-10-06 DIAGNOSIS — E876 Hypokalemia: Secondary | ICD-10-CM | POA: Diagnosis not present

## 2022-10-06 DIAGNOSIS — R262 Difficulty in walking, not elsewhere classified: Secondary | ICD-10-CM | POA: Diagnosis not present

## 2022-10-06 DIAGNOSIS — S31809A Unspecified open wound of unspecified buttock, initial encounter: Secondary | ICD-10-CM | POA: Diagnosis not present

## 2022-10-06 DIAGNOSIS — R059 Cough, unspecified: Secondary | ICD-10-CM | POA: Diagnosis not present

## 2022-10-06 MED ORDER — AMOXICILLIN-POT CLAVULANATE 875-125 MG PO TABS: 1.0000 | ORAL_TABLET | Freq: Two times a day (BID) | ORAL | 0 refills | Status: AC

## 2022-10-06 MED ORDER — SENNOSIDES-DOCUSATE SODIUM 8.6-50 MG PO TABS: 2.0000 | ORAL_TABLET | Freq: Every day | ORAL | Status: AC

## 2022-10-06 MED ORDER — BENZONATATE 100 MG PO CAPS: 100.0000 mg | ORAL_CAPSULE | Freq: Two times a day (BID) | ORAL | Status: DC | PRN

## 2022-10-06 MED ORDER — FLEET ENEMA 7-19 GM/118ML RE ENEM: 1.0000 | ENEMA | RECTAL | Status: DC

## 2022-10-06 MED ORDER — SENNOSIDES-DOCUSATE SODIUM 8.6-50 MG PO TABS
2.0000 | ORAL_TABLET | Freq: Once | ORAL | Status: AC
  Administered 2022-10-06: 2 via ORAL

## 2022-10-06 MED ORDER — BISACODYL 10 MG RE SUPP: 10.0000 mg | Freq: Every day | RECTAL | Status: DC | PRN

## 2022-10-06 MED ORDER — AMOXICILLIN-POT CLAVULANATE 875-125 MG PO TABS
1.0000 | ORAL_TABLET | Freq: Two times a day (BID) | ORAL | Status: DC
  Administered 2022-10-06: 1 via ORAL
  Filled 2022-10-06: qty 1

## 2022-10-06 NOTE — Care Management Important Message (Signed)
Important Message  Patient Details  Name: Tina Pacheco MRN: [id number] Date of Birth: [birth year]   Medicare Important Message Given:  Yes     Olegario Messier A  10/06/2022, 12:06 PM

## 2022-10-06 NOTE — Plan of Care (Signed)
Patient discharged per MD orders at this time.All dc instructions,education and medications reviewed with the patient.Pt expressed understanding and will comply with dc instructions.follow up appointments was also communicated to the Pt.no verbal c/o or any ssx of distress.Pt was discharged to the [facility] for STR/PT/OT services per order.report was called to staff nurse Ayo before transport.Pt was transported by 2 [facility] personnel on a stretcher.

## 2022-10-06 NOTE — Discharge Instructions (Signed)
[facility]  Phone: [phone]  Fax: [phone]   Discharge Instructions after Reverse Shoulder Replacement    1. Activity/Sling: You are to be non-weight bearing on operative extremity. A sling/shoulder immobilizer has been provided for you. Only remove the sling to perform elbow, wrist, and hand RoM exercises and hygiene/dressing. Active reaching and lifting are not permitted. You will be given further instructions on sling use at your first physical therapy visit and postoperative visit with Dr. Joice Lofts  2. Dressings: Dressing may be removed at 1st physical therapy visit (~3-4 days after surgery). Afterwards, you may either leave open to air (if no drainage) or cover with dry, sterile dressing. If you have steri-strips on your wound, please do not remove them. They will fall off on their own. You may shower 5 days after surgery. Please pat incision dry. Do not rub or place any shear forces across incision. If there is drainage or any opening of incision after 5 days, please notify our offices immediately.    3. Driving:  Plan on not driving for six weeks. Please note that you are advised NOT to drive while taking narcotic pain medications as you may be impaired and unsafe to drive.   4. Medications:  - You have been provided a prescription for narcotic pain medicine (usually oxycodone). After surgery, take 1-2 narcotic tablets every 4 hours if needed for severe pain. Please start this as soon as you begin to start having pain (if you received a nerve block, start taking as soon as this wears off).  - A prescription for anti-nausea medication will be provided in case the narcotic medicine causes nausea - take 1 tablet every 6 hours only if nauseated.  - Resume Eliquis postoperatively -Take tylenol 1000mg  (2 Extra strength or 3 regular strength tablets) every 8 hours for pain. This will reduce the amount of narcotic medication needed. May stop tylenol when you are having minimal pain. -  Take a stool softener (Colace, Dulcolax or Senakot) if you are using narcotic pain medications to help with constipation that is associated with narcotic use. - DO NOT take ANY nonsteroidal anti-inflammatory pain medications: Advil, Motrin, Ibuprofen, Aleve, Naproxen, or Naprosyn.   If you are taking prescription medication for anxiety, depression, insomnia, muscle spasm, chronic pain, or for attention deficit disorder you are advised that you are at a higher risk of adverse effects with use of narcotics post-op, including narcotic addiction/dependence, depressed breathing, death. If you use non-prescribed substances: alcohol, marijuana, cocaine, heroin, methamphetamines, etc., you are at a higher risk of adverse effects with use of narcotics post-op, including narcotic addiction/dependence, depressed breathing, death. You are advised that taking > 50 morphine milligram equivalents (MME) of narcotic pain medication per day results in twice the risk of overdose or death. For your prescription provided: oxycodone 5 mg - taking more than 6 tablets per day after the first few days of surgery.   5. Physical Therapy: 1-2 times per week for ~12 weeks. Therapy typically starts on post operative Day 3 or 4. You have been provided an order for physical therapy. The therapist will provide home exercises. Please contact our offices if this appointment has not been scheduled.    6. Work: May do light duty/desk job in approximately 2 weeks when off of narcotics, pain is well-controlled, and swelling has decreased if able to function with one arm in sling. Full work may take 6 weeks if light motions and function of both arms is required. Lifting jobs may require 12  weeks.   7. Post-Op Appointments: Your first post-op appointment will be with Dr. Allena Katz in approximately 2 weeks time.    If you find that they have not been scheduled please call the Orthopaedic Appointment front desk at  [phone].                               Rosealee Albee, MD [facility] Phone: [phone] Fax: [phone]   REVERSE SHOULDER ARTHROPLASTY REHAB GUIDELINES   These guidelines should be tailored to individual patients based on their rehab goals, age, precautions, quality of repair, etc.  Progression should be based on patient progress and approval by the referring physician.  PHASE 1 - Day 1 through Week 2  GENERAL GUIDELINES AND PRECAUTIONS Sling wear 24/7 except during grooming and home exercises (3 to 5 times daily) Avoid shoulder extension such that the arm is posterior the frontal plane.  When patients recline, a pillow should be placed behind the upper arm and sling should be on.  They should be advised to always be able to see the elbow Avoid combined IR/ADD/EXT, such as hand behind back to prevent dislocation Avoid combined IR and ADD such as reaching across the chest to prevent dislocation No AROM No submersion in pool/water for 4 weeks No weight bearing through operative arm (as in transfers, walker use, etc.)  GOALS Maintain integrity of joint replacement; protect soft tissue healing Increase PROM for elevation to 120 and ER to 30 (will remain the goal for first 6 weeks) Optimize distal UE circulation and muscle activity (elbow, wrist and hand) Instruct in use of sling for proper fit, polar care device for ice application after HEP, signs/symptoms of infection  EXERCISES Active elbow, wrist and hand Passive forward elevation in scapular plane to 90-120 max motion; ER in scapular plane to 30 Active scapular retraction with arms resting in neutral position  CRITERIA TO PROGRESS TO PHASE 2 Low pain (less than 3/10) with shoulder PROM Healing of incision without signs of infection Clearance by MD to advance after 2 week MD check up  PHASE 2 - 2 weeks - 6 weeks  GENERAL GUIDELINES AND PRECAUTIONS Sling may be removed while at  home; worn in community without abduction pillow May use arm for light activities of daily living (such as feeding, brushing teeth, dressing.) with elbow near  the side of the body  and arm in front of the body- no active lifting of the arm May submerge in water (tub, pool, [place], etc.) after 4 weeks Continue to avoid WBing through the operative arm Continue to avoid combined IR/EXT/ADD (hand behind the back) and IR/ADD  (reaching across chest) for dislocation precautions  GOALS  Achieve passive elevation to 120 and ER to 30  Low (less than 3/10) to no pain  Ability to fire all heads of the deltoid  EXERCISES May discontinue grip, and active elbow and wrist exercises since using the arm in ADL's  with sling removed around the home Continue passive elevation to 120 and ER to 30, both in scapular plane with arm supported on table top Add submaximal isometrics, pain free effort, for all functional heads of deltoid (anterior, posterior, middle)  Ensure that with posterior deltoid isometric the shoulder does not move into extension and the arm remains anterior the frontal plane At 4 weeks:  begin to place arm in balanced position of 90 deg elevation in supine; when patient able to hold this  position with ease, may begin reverse pendulums clockwise and counterclockwise  CRITERIA TO PROGRESS TO PHASE 3 Passive forward elevation in scapular plane to 120; passive ER in scapular plane to 30 Ability to fire isometrically all heads of the deltoid muscle without pain Ability to place and hold the arm in balanced position (90 deg elevation in supine)  PHASE 3 - 6 weeks to 3 months  GENERAL GUIDELINES AND PRECAUTIONS Discontinue use of sling Avoid forcing end range motion in any direction to prevent dislocation  May advance use of the arm actively in ADL's without being restricted to arm by the side of the body, however, avoid heavy lifting and sports (forever!) May initiate functional IR behind the  back gently NO UPPER BODY ERGOMETER   GOALS Optimize PROM for elevation and ER in scapular plane with realistic expectation that max  mobility for elevation is usually around 145-160 passively; ER 40 to 50 passively; functional IR to L1 Recover AROM to approach as close to PROM available as possible; may expect 135-150 deg active elevation; 30 deg active ER; active functional IR to L1 Establish dynamic stability of the shoulder with deltoid and periscapular muscle gradual strengthening  EXERCISES Forward elevation in scapular plane active progression: supine to incline, to vertical; short to long lever arm Balanced position long lever arm AROM Active ER/IR with arm at side Scapular retraction with light band resistance Functional IR with hand slide up back - very gentle and gradual NO UPPER BODY ERGOMETER     CRITERIA TO PROGRESS TO PHASE 4  AROM equals/approaches PROM with good mechanics for elevation   No pain  Higher level demand on shoulder than ADL functions   PHASE 4 12 months and beyond  GENERAL GUIDELINES AND PRECAUTIONS No heavy lifting and no overhead sports No heavy pushing activity Gradually increase strength of deltoid and scapular stabilizers; also the rotator cuff if present with weights not to exceed 5 lbs NO UPPER BODY ERGOMETER   GOALS  Optimize functional use of the operative UE to meet the desired demands  Gradual increase in deltoid, scapular muscle, and rotator cuff strength  Pain free functional activities   EXERCISES Add light hand weights for deltoid up to and not to exceed 3 lbs for anterior and posterior with long arm lift against gravity; elbow bent to 90 deg for abduction in scapular plane Theraband progression for extension to hip with scapular depression/retraction Theraband progression for serratus anterior punches in supine; avoid wall, incline or prone pressups for serratus anterior End range stretching gently without forceful overpressure in  all planes (elevation in scapular plane, ER in scapular plane, functional IR) with stretching done for life as part of a daily routine NO UPPER BODY ERGOMETER     CRITERIA FOR DISCHARGE FROM SKILLED PHYSICAL THERAPY  Pain free AROM for shoulder elevation (expect around 135-150)  Functional strength for all ADL's, work tasks, and hobbies approved by Careers adviser  Independence with home maintenance program   NOTES: 1. With proper exercise, motion, strength, and function continue to improve even after one year. 2. The complication rate after surgery is 5 - 8%. Complications include infection, fracture, heterotopic bone formation, nerve injury, instability, rotator cuff tear, and tuberosity nonunion. Please look for clinical signs, unusual symptoms, or lack of progress with therapy and report those to Dr. Joice Lofts. Prefer more communication than less.  3. The therapy plan above only serves as a guide. Please be aware of specific individualized patient instructions as written on the prescription  or through discussions with the surgeon. 4. Please call Dr. Allena Katz if you have any specific questions or concerns [phone]

## 2022-10-06 NOTE — Discharge Summary (Signed)
Physician Discharge Summary   Patient: Tina Pacheco MRN: [id number] DOB: May 21, 1950  Admit date:     10/02/2022  Discharge date: 10/06/22  Discharge Physician: Delfino Lovett   PCP: Karie Schwalbe, MD   Recommendations at discharge:   Follow-up with outpatient providers as requested  Discharge Diagnoses: Principal Problem:   Shoulder dislocation, left, initial encounter Active Problems:   At risk for falling   Chronic obstructive pulmonary disease (HCC)   HTN (hypertension)   CAD (coronary artery disease)   Rheumatoid arthritis (HCC)   Insomnia   Chronic pain syndrome   RLS (restless legs syndrome)   Paroxysmal A-fib (HCC)   Chronic diastolic CHF (congestive heart failure) (HCC)   Polymyalgia rheumatica (HCC)   Stage 3b chronic kidney disease (HCC)   Morbid obesity (HCC)   Sleep apnea, unspecified   Mixed hyperlipidemia   Rheumatoid arthritis, unspecified (HCC)   Traumatic closed displaced fracture of left shoulder with anterior dislocation  Hospital Course: [name] is a 72 year old female with history of rheumatoid arthritis, CAD, obstructive sleep apnea, dCHF, morbid obesity, paroxysmal atrial fibrillation on anticoagulation, who presented to the emergency department for chief concerns of worsening left shoulder pain after a fall.  Patient had unsuccessful closed reduction and OR closed reduction. She had left reverse shoulder arthroplasty. Ortho on board. PT/ OT suggested SNF placement.  Patient slid to the ground landing on her left side when she was changing from her wheelchair to the couch.  Patient was found to have anterior humeral head dislocation.  Patient had close reduction of the shoulder in the emergency department without success.  EDP consulted orthopedic service and patient was taken to the OR manipulation attempted closed reduction of the left shoulder under anesthesia.  Orthopedic service consulted [facility] hospitalist for hospitalist admission due  to patient's multiple medical comorbidity.  Per orthopedic service, closed reduction under anesthesia attempt was unsuccessful in the OR.  Orthopedic service will discuss with patient's orthopedic doctor, Dr. Joice Lofts for tentative plan to return to the OR tomorrow for reverse shoulder arthroplasty.  Assessment and Plan: Shoulder dislocation, left, initial encounter Closed reduction of the shoulder per EDP and was unsuccessful. Status post OR closed reduction under anesthesia by orthopedic service, also unsuccessful. S/p Left Reverse shoulder arthroplasty 10/03/22. Tolerated procedure well. Continue pain control with Norco PT/ OT recommends SNF placement..  She has a bed now available at [facility] where she is being discharged in stable condition   Chronic obstructive pulmonary disease (HCC) No exacerbation. Continue home inhalers.   Chronic diastolic CHF Paroxysmal Afib- HR, BP stable.  Well compensated at this time.  Rate well-controlled with metoprolol Restarted Eliquis from tonight.   CKD stage 3b- Stable.   At risk for falling Fall precautions, PT, OT follow up.   Sleep apnea, unspecified CPAP nightly ordered   Morbid obesity (HCC) This complicates overall care and prognosis.    Chronic pain syndrome on chronic opioids Continue home regimen Pain management as ordered Hold pain meds if more lethargic and sleepy.       Consultants: Orthopedics Procedures performed: Closed reduction of the shoulder per EDP and was unsuccessful. Status post OR closed reduction under anesthesia by orthopedic service, also unsuccessful. S/p Left Reverse shoulder arthroplasty 10/03/22  Disposition: Skilled nursing facility/palliative care to follow Diet recommendation:  Discharge Diet Orders (From admission, onward)     Start     Ordered   10/06/22 0000  Diet - low sodium heart healthy  10/06/22 1204           Carb modified diet DISCHARGE MEDICATION: Allergies as of  10/06/2022       Reactions   Fluoxetine Other (See Comments)   Headache, shaking, sleep issues Headache, shaking, sleep issues   Codeine    Nausea and vomiting/only when taking too much        Medication List     STOP taking these medications    amoxicillin 500 MG capsule Commonly known as: AMOXIL   Oxycodone HCl 20 MG Tabs   Potassium Chloride 40 MEQ/15ML (20%) Soln       TAKE these medications    amiodarone 200 MG tablet Commonly known as: PACERONE TAKE 1 TABLET(200 MG) BY MOUTH DAILY   amoxicillin-clavulanate 875-125 MG tablet Commonly known as: AUGMENTIN Take 1 tablet by mouth every 12 (twelve) hours for 5 days.   apixaban 5 MG Tabs tablet Commonly known as: Eliquis Take 1 tablet (5 mg total) by mouth 2 (two) times daily.   atorvastatin 40 MG tablet Commonly known as: LIPITOR TAKE 1 TABLET(40 MG) BY MOUTH DAILY   benzonatate 100 MG capsule Commonly known as: TESSALON Take 1 capsule (100 mg total) by mouth 2 (two) times daily as needed for cough.   bisacodyl 10 MG suppository Commonly known as: DULCOLAX Place 1 suppository (10 mg total) rectally daily as needed for moderate constipation.   CIMZIA [place] Inject into the skin. Patient states taking 1 x per month   HYDROcodone-acetaminophen 5-325 MG tablet Commonly known as: NORCO/VICODIN Take 1-2 tablets by mouth every 6 (six) hours as needed for severe pain.   ketoconazole 2 % cream Commonly known as: NIZORAL Apply 1 Application topically as needed.   metolazone 2.5 MG tablet Commonly known as: ZAROXOLYN TAKE 1 TABLET BY MOUTH DAILY AS NEEDED. MAY GIVE 1 TABLET AS NEEDED FOR WEIGHT GAIN OF 5 LB IN 1 WEEK   metoprolol succinate 50 MG 24 hr tablet Commonly known as: TOPROL-XL TAKE 1 TABLET BY MOUTH EVERY DAY WITH OR IMMEDIATELY FOLLOWING A MEAL What changed: See the new instructions.   montelukast 10 MG tablet Commonly known as: SINGULAIR TAKE 1 TABLET BY MOUTH AT BEDTIME   nitroGLYCERIN 0.4 MG  SL tablet Commonly known as: NITROSTAT Place 1 tablet (0.4 mg total) under the tongue every 5 (five) minutes as needed for chest pain.   nystatin powder Commonly known as: MYCOSTATIN/NYSTOP Apply 1 application topically 3 (three) times daily as needed.   omeprazole 40 MG capsule Commonly known as: PRILOSEC Take 1 capsule (40 mg total) by mouth at bedtime.   ondansetron 4 MG disintegrating tablet Commonly known as: ZOFRAN-ODT DISSOLVE 1 TABLET(4 MG) ON THE TONGUE EVERY 8 HOURS AS NEEDED FOR NAUSEA OR VOMITING   polyethylene glycol 17 g packet Commonly known as: MIRALAX / GLYCOLAX Take 17 g by mouth daily as needed for mild constipation.   predniSONE 5 MG tablet Commonly known as: DELTASONE Take 1 tablet (5 mg total) by mouth daily with breakfast. What changed: when to take this   promethazine 12.5 MG tablet Commonly known as: PHENERGAN Take 1 tablet (12.5 mg total) by mouth every 6 (six) hours as needed for nausea or vomiting.   senna-docusate 8.6-50 MG tablet Commonly known as: Senokot-S Take 2 tablets by mouth at bedtime.   spironolactone 25 MG tablet Commonly known as: ALDACTONE TAKE 1 TABLET(25 MG) BY MOUTH DAILY   terazosin 10 MG capsule Commonly known as: HYTRIN TAKE 1 CAPSULE(10 MG) BY MOUTH  AT BEDTIME   torsemide 20 MG tablet Commonly known as: DEMADEX Take 1 tablet (20 mg total) by mouth daily. What changed: how much to take               Discharge Care Instructions  (From admission, onward)           Start     Ordered   10/06/22 0000  Discharge wound care:       Comments: As above   10/06/22 1204            Contact information for follow-up providers     Anson Oregon, PA-C Follow up in 2 week(s).   Specialty: Physician Assistant Contact information: [address] [place] [address] [phone]         Karie Schwalbe, MD. Schedule an appointment as soon as possible for a visit in 1 week(s).    Specialties: Internal Medicine, Pediatrics Why: [facility] Discharge F/UP Contact information: [address] [place] [address] [phone]              Contact information for after-discharge care     Destination     [facility] Preferred SNF .   Service: Skilled Nursing Contact information: [address] [place] [address] [phone]                    Discharge Exam: Ceasar Mons Weights   10/02/22 1522 10/02/22 1701 10/03/22 1326  Weight: 119.7 kg 119.7 kg 119.7 kg   General - Elderly ill Caucasian female, no apparent distress HEENT - PERRLA, EOMI, atraumatic head, left eyelid swelling. Lung - Clear, rales, rhonchi, wheezes. Heart - S1, S2 heard, no murmurs, rubs, trace pedal edema Neuro - Alert, awake and oriented x 3, non focal exam. Skin - Warm and dry. Left shoulder immobilizer, cold pack system noted.  Condition at discharge: fair  The results of significant diagnostics from this hospitalization (including imaging, microbiology, ancillary and laboratory) are listed below for reference.   Imaging Studies: DG Shoulder Left Port  Result Date: 10/03/2022 CLINICAL DATA:  Status post reverse total left shoulder arthroplasty. Postoperative. EXAM: LEFT SHOULDER COMPARISON:  Left shoulder radiograph 10/02/2022 (multiple studies including intraoperative attempts to relocated glenohumeral joint). FINDINGS: Interval reverse total left shoulder arthroplasty. Oblique frontal and transscapular Y-views were performed. The technologist notes the best possible images were performed within patient ability. The reversal left shoulder arthroplasty hardware appears normally aligned. Anterior surgical skin staples are noted. No perihardware lucency is seen to indicate hardware failure or loosening. IMPRESSION: Interval reverse total left shoulder arthroplasty. No evidence of hardware failure or loosening.  Electronically Signed   By: Neita Garnet M.D.   On: 10/03/2022 20:00   [place] OR NERVE BLOCK-IMAGE ONLY [facility])  Result Date: 10/03/2022 There is no interpretation for this exam.  This order is for images obtained during a surgical procedure.  Please See "Surgeries" Tab for more information regarding the procedure.   CT SHOULDER LEFT WO CONTRAST  Result Date: 10/02/2022 CLINICAL DATA:  Shoulder pain, chronic, instability suspected, xray done EXAM: CT OF THE UPPER LEFT EXTREMITY WITHOUT CONTRAST TECHNIQUE: Multidetector CT imaging of the upper left extremity was performed according to the standard protocol. RADIATION DOSE REDUCTION: This exam was performed according to the departmental dose-optimization program which includes automated exposure control, adjustment of the mA and/or kV according to patient size and/or use of iterative reconstruction technique. COMPARISON:  Shoulder x-ray earlier  today FINDINGS: Bones/Joint/Cartilage Continued anterior dislocation of the left glenohumeral joint. There is a fracture fragment noted along the anterior aspect of the glenoid, likely Bankart fracture. Left shoulder joint effusion. Large fluid collection lateral to the humeral head and proximal shaft likely within overlying bursa. Ligaments Suboptimally assessed by CT. Muscles and Tendons Grossly unremarkable. Soft tissues Negative IMPRESSION: Continued anterior dislocation of the left glenohumeral joint with Bankart fracture. Associated moderate joint effusion. Fluid collection lateral to the humeral head and proximal shaft likely fluid within the overlying bursa. Electronically Signed   By: Charlett Nose M.D.   On: 10/02/2022 20:36   DG Shoulder Left  Result Date: 10/02/2022 CLINICAL DATA:  Elective surgery. Multiple attempts of closed reduction in the OR under anesthesia. EXAM: LEFT SHOULDER - 2+ VIEW COMPARISON:  Radiographs earlier today. FINDINGS: Multiple AP and axial views of the left shoulder obtained in the  operating room during attempted reduction shoulder dislocation. Majority of these demonstrate persistent anterior shoulder dislocation. Final attempt demonstrates improved glenohumeral alignment, although humerus appears to be persistently anteriorly dislocated on the axial view. Suspect glenoid fracture, lower not well-defined. IMPRESSION: Persistent anterior shoulder dislocation. Final attempt demonstrates improved glenohumeral alignment, although humerus appears to be persistently anteriorly dislocated on the axial view. Electronically Signed   By: Narda Rutherford M.D.   On: 10/02/2022 18:17   DG Shoulder 1V Left  Result Date: 10/02/2022 CLINICAL DATA:  Dislocation of left shoulder, postreduction view EXAM: LEFT SHOULDER COMPARISON:  Study done earlier today FINDINGS: Single AP view of the left shoulder shows persistent anterior dislocation. IMPRESSION: Persistent anterior dislocation. Electronically Signed   By: Ernie Avena M.D.   On: 10/02/2022 16:12   DG Shoulder 1V Left  Result Date: 10/02/2022 CLINICAL DATA:  Pain EXAM: LEFT SHOULDER one-view COMPARISON:  Earlier 10/02/2022 FINDINGS: Anterior shoulder dislocation still present. Postreduction. Imaging obtained to aid in treatment IMPRESSION: Persistent anterior shoulder dislocation Electronically Signed   By: Karen Kays M.D.   On: 10/02/2022 16:07   DG Shoulder Left  Result Date: 10/02/2022 CLINICAL DATA:  Left shoulder pain after a fall 2 days ago EXAM: LEFT SHOULDER - 2+ VIEW COMPARISON:  Shoulder radiographs 09/23/2018, shoulder MRI 03/09/2018 FINDINGS: The humeral head is anteriorly and inferiorly dislocated relative to the glenoid. Acromioclavicular alignment is maintained. There is no definite acute fracture. There is greater tuberosity surface irregularity consistent with rotator cuff pathology. The soft tissues are unremarkable. IMPRESSION: Anterior humeral head dislocation.  No definite fracture. Electronically Signed   By: Lesia Hausen M.D.   On: 10/02/2022 12:44    Microbiology: Results for orders placed or performed during the hospital encounter of 10/02/22  Surgical PCR screen     Status: None   Collection Time: 10/02/22 11:16 PM   Specimen: Nasal Mucosa; Nasal Swab  Result Value Ref Range Status   MRSA, PCR NEGATIVE NEGATIVE Final   Staphylococcus aureus NEGATIVE NEGATIVE Final    Comment: (NOTE) The Xpert SA Assay (FDA approved for NASAL specimens in patients 28 years of age and older), is one component of a comprehensive surveillance program. It is not intended to diagnose infection nor to guide or monitor treatment. Performed at [facility], [address] [place], [place] [address]    *Note: Due to a large number of results and/or encounters for the requested time period, some results have not been displayed. A complete set of results can be found in Results Review.    Labs: CBC: Recent Labs  Lab 10/02/22  1648 10/03/22 0455 10/04/22 0423 10/06/22 0555  WBC 7.7 7.3 6.6 5.8  NEUTROABS 5.9  --   --   --   HGB 10.2* 9.5* 9.3* 8.7*  HCT 32.8* 30.7* 29.8* 28.2*  MCV 89.6 89.2 90.0 89.2  PLT 240 219 192 205   Basic Metabolic Panel: Recent Labs  Lab 10/02/22 1648 10/03/22 0455 10/04/22 0423 10/05/22 0310 10/06/22 0555  NA 136 139 139 137 135  K 4.0 4.2 4.9 5.1 4.3  CL 101 105 107 102 100  CO2 25 25 22 26 26   GLUCOSE 93 70 120* 106* 91  BUN 35* 32* 28* 31* 35*  CREATININE 1.76* 1.63* 1.61* 1.50* 1.46*  CALCIUM 9.8 9.3 9.2 9.6 9.1  MG  --   --  1.8 1.9 1.8   Liver Function Tests: No results for input(s): "AST", "ALT", "ALKPHOS", "BILITOT", "PROT", "ALBUMIN" in the last 168 hours. CBG: No results for input(s): "GLUCAP" in the last 168 hours.  Discharge time spent: greater than 30 minutes.  Signed: Delfino Lovett, MD [facility] 10/06/2022

## 2022-10-06 NOTE — Progress Notes (Signed)
Subjective: 3 Days Post-Op Procedure(s) (LRB): REVERSE SHOULDER ARTHROPLASTY (Left) Patient reports older pain as mild.  Does complain of some back pain and being uncomfortable in the bed. Patient is well, and has had no acute complaints or problems Denies any CP, SOB, ABD pain. Continue with physical therapy/OT today..    Objective: Vital signs in last 24 hours: Temp:  [98.3 F (36.8 C)-98.8 F (37.1 C)] 98.3 F (36.8 C) (08/09 0808) Pulse Rate:  [61-79] 76 (08/09 0808) Resp:  [15-18] 18 (08/09 0808) BP: (148-172)/(62-83) 150/63 (08/09 0808) SpO2:  [93 %-100 %] 95 % (08/09 0808)  Intake/Output from previous day: 08/08 0701 - 08/09 0700 In: 600 [P.O.:600] Out: 1450 [Urine:1450] Intake/Output this shift: Total I/O In: -  Out: 500 [Urine:500]  Recent Labs    10/04/22 0423 10/06/22 0555  HGB 9.3* 8.7*   Recent Labs    10/04/22 0423 10/06/22 0555  WBC 6.6 5.8  RBC 3.31* 3.16*  HCT 29.8* 28.2*  PLT 192 205   Recent Labs    10/05/22 0310 10/06/22 0555  NA 137 135  K 5.1 4.3  CL 102 100  CO2 26 26  BUN 31* 35*  CREATININE 1.50* 1.46*  GLUCOSE 106* 91  CALCIUM 9.6 9.1   No results for input(s): "LABPT", "INR" in the last 72 hours.  EXAM General - Patient is Alert, Appropriate, and Oriented Left Upper Extremity - Neurovascular intact Sensation intact distally Intact pulses distally Full composite fist LUE Sling intact Dressing - scant drainage   Past Medical History:  Diagnosis Date   (HFpEF) heart failure with preserved ejection fraction (HCC)    a.) TTE 08/10/2014: EF 55-60%, mild MAC, mild LAE, mild MR, PASP 38; b.) TTE 03/31/2019: EF 55-60%, mild LVH, mild-mod LAW, mod MAC, mild-mod AoV sclerosis, triv PR, G1DD; c.) TTE 05/29/2020: EF 60-65%, mild LVH, mod LAE, G1DD   Anemia    Aortic atherosclerosis (HCC)    Asthma    Basal cell carcinoma    CAD (coronary artery disease)    a.) LHC 05/24/2010 - nonobstructive CAD - med mgmt; b.) LHC  06/04/2020: 30% dLAD, 40% o-mLAD - med mgmt   Chronic, continuous use of opioids    Claustrophobia    COPD (chronic obstructive pulmonary disease) (HCC)    DDD (degenerative disc disease), lumbosacral    Diuretic-induced hypokalemia    Dyslipidemia    Fibromyalgia    Frequent falls    GERD (gastroesophageal reflux disease)    Gouty arthropathy    Hemihypertrophy    History of bilateral cataract extraction 2018   Hyperplastic colonic polyp 2003   Hypertension    Long term current use of amiodarone    Long term current use of anticoagulant    a.) apixaban   Long term current use of immunosuppressive drug    a.) certolizumab pegol  + predisone for RA diagnosis   MDD (major depressive disorder)    Migraines    Morbid obesity (HCC)    Nontraumatic complete tear of rotator cuff, left    OSA (obstructive sleep apnea)    a.) non-complaint with prescribed nocturnal PAP therapy   Osteoarthritis    PAF (paroxysmal atrial fibrillation) (HCC)    a.) CHA2DS2-VASc = 5 (age, sex, CHF, HTN, vascular disease history); b.) rate and rhythm maintained on oral amiodarone + metoprolol succinate; chronically anticoagulated with standard dose apixaban   Pneumonia    PONV (postoperative nausea and vomiting)    Prediabetes    Rheumatoid arthritis (HCC)  a.) on certolizumab pegol + predisone   Stage 3 chronic kidney disease (HCC)     Assessment/Plan:   3 Days Post-Op Procedure(s) (LRB): REVERSE SHOULDER ARTHROPLASTY (Left) Principal Problem:   Shoulder dislocation, left, initial encounter Active Problems:   HTN (hypertension)   CAD (coronary artery disease)   Rheumatoid arthritis (HCC)   Insomnia   Chronic pain syndrome   RLS (restless legs syndrome)   Paroxysmal A-fib (HCC)   Chronic diastolic CHF (congestive heart failure) (HCC)   Polymyalgia rheumatica (HCC)   Stage 3b chronic kidney disease (HCC)   At risk for falling   Morbid obesity (HCC)   Sleep apnea, unspecified   Chronic  obstructive pulmonary disease (HCC)   Mixed hyperlipidemia   Rheumatoid arthritis, unspecified (HCC)   Traumatic closed displaced fracture of left shoulder with anterior dislocation  Estimated body mass index is 46.75 kg/m as calculated from the following:   Height as of this encounter: 5\' 3"  (1.6 m).   Weight as of this encounter: 119.7 kg. Advance diet Up with therapy Pain controlled Labs and VSS CM to assist with discharge to skilled nursing facility at discharge.  Patient will need 2-week follow-up with [facility] orthopedics   DVT Prophylaxis - Eliquis    T. Cranston Neighbor, PA-C [facility] 10/06/2022, 8:45 AM

## 2022-10-06 NOTE — Plan of Care (Signed)

## 2022-10-06 NOTE — Consult Note (Signed)
  [facility] Customer service manager [facility]) Accountable Care Organization (ACO) [facility] Hospital Liaison Note  10/06/2022  Tina Pacheco [birth year] [id number]  Location: [facility] RN Hospital Liaison screened the patient remotely at [facility].  Insurance:  Medicare   Tina Pacheco is a 72 y.o. female who is a Primary Care Patient of [place], Berneda Rose, MD ([facility] primary Care at [facility]). The patient was screened for readmission hospitalization with noted high risk score for unplanned readmission risk with 1 IP in 6 months.  The patient was assessed for potential [facility]) Care Management service needs for post hospital transition for care coordination. Review of patient's electronic medical record reveals patient was admitted Shoulder dislocation, left initial encounter.   PLAN: Pt will be discharged to SNF for ongoing rehab. Facility will continue to address and provide pt's needs.   [facility] Care Management/Population Health does not replace or interfere with any arrangements made by the Inpatient Transition of Care team.   For questions contact:   Elliot Cousin, RN, [facility] Liaison [place]   Population Health Office Hours MTWF  8:00 am-6:00 pm Off on Thursday [phone] mobile [phone] [Office toll free line] Office Hours are M-F 8:30 - 5 pm [email or web address]

## 2022-10-06 NOTE — TOC Transition Note (Signed)
Transition of Care [facility]) - CM/SW Discharge Note   Patient Details  Name: Tina Pacheco MRN: [id number] Date of Birth: 05/17/1950  Transition of Care [facility]) CM/SW Contact:  Garret Reddish, RN Phone Number: 10/06/2022, 2:03 PM   Clinical Narrative:   Chart reviewed.  Noted that patient has orders for discharge today.    I have reviewed bed offers with Mrs. Zeoli has chosen to go to [facility].  I have spoken with Eilen with [facility] and she is able to accept patient to the facility today.  I have sent [facility] patient's Discharge Summary, Discharge Orders, and SNF Transfer Report.  Marjean Donna reports that patient will be going to room 705 and please call report to [phone].  I have informed patient's daughter Trula Ore that patient will be going to [facility] today.  I have informed Trula Ore at [facility] will transport patient to [facility] today.    I have arranged EMS with [facility].    I have informed staff nurse of the above information.    Final next level of care: Skilled Nursing Facility Barriers to Discharge: No Barriers Identified   Patient Goals and CMS Choice CMS Medicare.gov Compare Post Acute Care list provided to:: Patient Choice offered to / list presented to : Patient  Discharge Placement                Patient chooses bed at: [facility] Patient to be transferred to facility by: [facility] Name of family member notified: Waylan Rocher Patient and family notified of of transfer: 10/06/22  Discharge Plan and Services Additional resources added to the After Visit Summary for   In-house Referral: Clinical Social Work   Post Acute Care Choice: Skilled Nursing Facility                               Social Determinants of Health (SDOH) Interventions SDOH Screenings   Food Insecurity: No Food Insecurity (10/02/2022)  Housing: Low Risk  (10/02/2022)  Transportation  Needs: No Transportation Needs (10/02/2022)  Utilities: Not At Risk (10/02/2022)  Depression (PHQ2-9): High Risk (04/14/2022)  Financial Resource Strain: Low Risk  (06/19/2022)  Physical Activity: Unknown (06/19/2022)  Social Connections: Moderately Isolated (06/19/2022)  Stress: No Stress Concern Present (06/19/2022)  Tobacco Use: Low Risk  (10/03/2022)     Readmission Risk Interventions    04/22/2021    4:08 PM 11/14/2020    3:54 PM 06/01/2020   12:44 PM  Readmission Risk Prevention Plan  Transportation Screening Complete Complete Complete  PCP or Specialist Appt within 3-5 Days   Complete  HRI or Home Care Consult   Complete  Social Work Consult for Recovery Care Planning/Counseling   Complete  Palliative Care Screening   Not Applicable  Medication Review [employer]) Complete Complete --  PCP or Specialist appointment within 3-5 days of discharge Complete Complete   HRI or Home Care Consult Complete --   SW Recovery Care/Counseling Consult Not Complete Complete   SW Consult Not Complete Comments RNCM assigned to patient    Palliative Care Screening Not Applicable Not Applicable   Skilled Nursing Facility Not Applicable Complete

## 2022-10-09 ENCOUNTER — Telehealth: Payer: Self-pay | Admitting: Internal Medicine

## 2022-10-09 ENCOUNTER — Encounter: Payer: Self-pay | Admitting: Internal Medicine

## 2022-10-09 DIAGNOSIS — M6281 Muscle weakness (generalized): Secondary | ICD-10-CM | POA: Diagnosis not present

## 2022-10-09 DIAGNOSIS — D631 Anemia in chronic kidney disease: Secondary | ICD-10-CM | POA: Diagnosis not present

## 2022-10-09 DIAGNOSIS — J441 Chronic obstructive pulmonary disease with (acute) exacerbation: Secondary | ICD-10-CM | POA: Diagnosis not present

## 2022-10-09 DIAGNOSIS — I130 Hypertensive heart and chronic kidney disease with heart failure and stage 1 through stage 4 chronic kidney disease, or unspecified chronic kidney disease: Secondary | ICD-10-CM | POA: Diagnosis not present

## 2022-10-09 DIAGNOSIS — R296 Repeated falls: Secondary | ICD-10-CM | POA: Diagnosis not present

## 2022-10-09 DIAGNOSIS — I10 Essential (primary) hypertension: Secondary | ICD-10-CM | POA: Diagnosis not present

## 2022-10-09 DIAGNOSIS — I480 Paroxysmal atrial fibrillation: Secondary | ICD-10-CM | POA: Diagnosis not present

## 2022-10-09 DIAGNOSIS — M0680 Other specified rheumatoid arthritis, unspecified site: Secondary | ICD-10-CM | POA: Diagnosis not present

## 2022-10-09 DIAGNOSIS — S43085A Other dislocation of left shoulder joint, initial encounter: Secondary | ICD-10-CM | POA: Diagnosis not present

## 2022-10-09 DIAGNOSIS — I5032 Chronic diastolic (congestive) heart failure: Secondary | ICD-10-CM | POA: Diagnosis not present

## 2022-10-09 DIAGNOSIS — N1832 Chronic kidney disease, stage 3b: Secondary | ICD-10-CM | POA: Diagnosis not present

## 2022-10-09 NOTE — Telephone Encounter (Signed)
Pt daughter Trula Ore called in requesting a call back stated pt had surgery on her shoulder and nee d refill on RX oxycodone . Its no longer on Med list . Please advise [phone]

## 2022-10-09 NOTE — Telephone Encounter (Signed)
October 09, 2022 Me  to VIANCA GARDENHIRE      10/09/22 10:47 AM Yes ma'am. That will be just fine. We will do whatever we need to.   This MyChart message has not been read. KIAIRA PAKKALA  to Me      10/09/22  9:59 AM Ok that is what I needed to know.  Where should we request the oxy 20mg .  I may need to refer them to Dr Alphonsus Sias for confirmation that she had a prescription since the hospital removed it from her current medical list.  Is that ok?  Me  to BYRDIE VOEGELE      10/09/22  9:57 AM Are they not providing her medications? Will they allow her to take outside medications? The problem with her being in rehab is Dr Alphonsus Sias does not have authority to change her medication orders through them.   Last read by Humberto Leep at 10:03 AM on 10/09/2022. Humberto Leep  to [facility] Clinical Pool (supporting Karie Schwalbe, MD)      10/09/22  9:53 AM Good Morning,  My mother was admitted to the hospital last week. She dislocated her shoulder and they had to do a replacement on it-done Monday 10/02/22. She is currently in rehab at [facility] and I need to get her OxyContin prescription refilled.    At the hospital they removed the oxy from her prescription list and switched it to hydrocodone.  That doesn't work for her at all.   Can a nurse pls give me a call?

## 2022-10-10 DIAGNOSIS — M0680 Other specified rheumatoid arthritis, unspecified site: Secondary | ICD-10-CM | POA: Diagnosis not present

## 2022-10-10 DIAGNOSIS — J441 Chronic obstructive pulmonary disease with (acute) exacerbation: Secondary | ICD-10-CM | POA: Diagnosis not present

## 2022-10-10 DIAGNOSIS — I480 Paroxysmal atrial fibrillation: Secondary | ICD-10-CM | POA: Diagnosis not present

## 2022-10-10 DIAGNOSIS — I5032 Chronic diastolic (congestive) heart failure: Secondary | ICD-10-CM | POA: Diagnosis not present

## 2022-10-10 DIAGNOSIS — I10 Essential (primary) hypertension: Secondary | ICD-10-CM | POA: Diagnosis not present

## 2022-10-10 DIAGNOSIS — I130 Hypertensive heart and chronic kidney disease with heart failure and stage 1 through stage 4 chronic kidney disease, or unspecified chronic kidney disease: Secondary | ICD-10-CM | POA: Diagnosis not present

## 2022-10-10 DIAGNOSIS — N1832 Chronic kidney disease, stage 3b: Secondary | ICD-10-CM | POA: Diagnosis not present

## 2022-10-10 DIAGNOSIS — S43085A Other dislocation of left shoulder joint, initial encounter: Secondary | ICD-10-CM | POA: Diagnosis not present

## 2022-10-10 DIAGNOSIS — M6281 Muscle weakness (generalized): Secondary | ICD-10-CM | POA: Diagnosis not present

## 2022-10-10 DIAGNOSIS — D631 Anemia in chronic kidney disease: Secondary | ICD-10-CM | POA: Diagnosis not present

## 2022-10-10 DIAGNOSIS — R296 Repeated falls: Secondary | ICD-10-CM | POA: Diagnosis not present

## 2022-10-11 DIAGNOSIS — R296 Repeated falls: Secondary | ICD-10-CM | POA: Diagnosis not present

## 2022-10-11 DIAGNOSIS — I509 Heart failure, unspecified: Secondary | ICD-10-CM | POA: Diagnosis not present

## 2022-10-11 DIAGNOSIS — R1110 Vomiting, unspecified: Secondary | ICD-10-CM | POA: Diagnosis not present

## 2022-10-11 DIAGNOSIS — D631 Anemia in chronic kidney disease: Secondary | ICD-10-CM | POA: Diagnosis not present

## 2022-10-11 DIAGNOSIS — Z9181 History of falling: Secondary | ICD-10-CM | POA: Diagnosis not present

## 2022-10-11 DIAGNOSIS — M0680 Other specified rheumatoid arthritis, unspecified site: Secondary | ICD-10-CM | POA: Diagnosis not present

## 2022-10-11 DIAGNOSIS — Z515 Encounter for palliative care: Secondary | ICD-10-CM | POA: Diagnosis not present

## 2022-10-11 DIAGNOSIS — Z4781 Encounter for orthopedic aftercare following surgical amputation: Secondary | ICD-10-CM | POA: Diagnosis not present

## 2022-10-11 DIAGNOSIS — I10 Essential (primary) hypertension: Secondary | ICD-10-CM | POA: Diagnosis not present

## 2022-10-11 DIAGNOSIS — I5032 Chronic diastolic (congestive) heart failure: Secondary | ICD-10-CM | POA: Diagnosis not present

## 2022-10-11 DIAGNOSIS — N1832 Chronic kidney disease, stage 3b: Secondary | ICD-10-CM | POA: Diagnosis not present

## 2022-10-11 DIAGNOSIS — S43015D Anterior dislocation of left humerus, subsequent encounter: Secondary | ICD-10-CM | POA: Diagnosis not present

## 2022-10-11 DIAGNOSIS — I480 Paroxysmal atrial fibrillation: Secondary | ICD-10-CM | POA: Diagnosis not present

## 2022-10-11 DIAGNOSIS — M6281 Muscle weakness (generalized): Secondary | ICD-10-CM | POA: Diagnosis not present

## 2022-10-11 DIAGNOSIS — R278 Other lack of coordination: Secondary | ICD-10-CM | POA: Diagnosis not present

## 2022-10-11 DIAGNOSIS — J441 Chronic obstructive pulmonary disease with (acute) exacerbation: Secondary | ICD-10-CM | POA: Diagnosis not present

## 2022-10-11 DIAGNOSIS — J449 Chronic obstructive pulmonary disease, unspecified: Secondary | ICD-10-CM | POA: Diagnosis not present

## 2022-10-11 DIAGNOSIS — S43085A Other dislocation of left shoulder joint, initial encounter: Secondary | ICD-10-CM | POA: Diagnosis not present

## 2022-10-11 DIAGNOSIS — I130 Hypertensive heart and chronic kidney disease with heart failure and stage 1 through stage 4 chronic kidney disease, or unspecified chronic kidney disease: Secondary | ICD-10-CM | POA: Diagnosis not present

## 2022-10-12 DIAGNOSIS — M6281 Muscle weakness (generalized): Secondary | ICD-10-CM | POA: Diagnosis not present

## 2022-10-12 DIAGNOSIS — M0680 Other specified rheumatoid arthritis, unspecified site: Secondary | ICD-10-CM | POA: Diagnosis not present

## 2022-10-12 DIAGNOSIS — E876 Hypokalemia: Secondary | ICD-10-CM | POA: Diagnosis not present

## 2022-10-12 DIAGNOSIS — R296 Repeated falls: Secondary | ICD-10-CM | POA: Diagnosis not present

## 2022-10-12 DIAGNOSIS — S43085A Other dislocation of left shoulder joint, initial encounter: Secondary | ICD-10-CM | POA: Diagnosis not present

## 2022-10-12 DIAGNOSIS — J441 Chronic obstructive pulmonary disease with (acute) exacerbation: Secondary | ICD-10-CM | POA: Diagnosis not present

## 2022-10-12 DIAGNOSIS — N1832 Chronic kidney disease, stage 3b: Secondary | ICD-10-CM | POA: Diagnosis not present

## 2022-10-12 DIAGNOSIS — I130 Hypertensive heart and chronic kidney disease with heart failure and stage 1 through stage 4 chronic kidney disease, or unspecified chronic kidney disease: Secondary | ICD-10-CM | POA: Diagnosis not present

## 2022-10-12 DIAGNOSIS — I10 Essential (primary) hypertension: Secondary | ICD-10-CM | POA: Diagnosis not present

## 2022-10-12 DIAGNOSIS — R1110 Vomiting, unspecified: Secondary | ICD-10-CM | POA: Diagnosis not present

## 2022-10-12 DIAGNOSIS — I480 Paroxysmal atrial fibrillation: Secondary | ICD-10-CM | POA: Diagnosis not present

## 2022-10-12 DIAGNOSIS — I5032 Chronic diastolic (congestive) heart failure: Secondary | ICD-10-CM | POA: Diagnosis not present

## 2022-10-16 DIAGNOSIS — I10 Essential (primary) hypertension: Secondary | ICD-10-CM | POA: Diagnosis not present

## 2022-10-16 DIAGNOSIS — I5032 Chronic diastolic (congestive) heart failure: Secondary | ICD-10-CM | POA: Diagnosis not present

## 2022-10-16 DIAGNOSIS — M6281 Muscle weakness (generalized): Secondary | ICD-10-CM | POA: Diagnosis not present

## 2022-10-16 DIAGNOSIS — D631 Anemia in chronic kidney disease: Secondary | ICD-10-CM | POA: Diagnosis not present

## 2022-10-16 DIAGNOSIS — N1832 Chronic kidney disease, stage 3b: Secondary | ICD-10-CM | POA: Diagnosis not present

## 2022-10-16 DIAGNOSIS — I130 Hypertensive heart and chronic kidney disease with heart failure and stage 1 through stage 4 chronic kidney disease, or unspecified chronic kidney disease: Secondary | ICD-10-CM | POA: Diagnosis not present

## 2022-10-16 DIAGNOSIS — R296 Repeated falls: Secondary | ICD-10-CM | POA: Diagnosis not present

## 2022-10-16 DIAGNOSIS — J441 Chronic obstructive pulmonary disease with (acute) exacerbation: Secondary | ICD-10-CM | POA: Diagnosis not present

## 2022-10-16 DIAGNOSIS — S43085A Other dislocation of left shoulder joint, initial encounter: Secondary | ICD-10-CM | POA: Diagnosis not present

## 2022-10-16 DIAGNOSIS — M0680 Other specified rheumatoid arthritis, unspecified site: Secondary | ICD-10-CM | POA: Diagnosis not present

## 2022-10-16 DIAGNOSIS — I480 Paroxysmal atrial fibrillation: Secondary | ICD-10-CM | POA: Diagnosis not present

## 2022-10-17 DIAGNOSIS — S43085A Other dislocation of left shoulder joint, initial encounter: Secondary | ICD-10-CM | POA: Diagnosis not present

## 2022-10-17 DIAGNOSIS — I130 Hypertensive heart and chronic kidney disease with heart failure and stage 1 through stage 4 chronic kidney disease, or unspecified chronic kidney disease: Secondary | ICD-10-CM | POA: Diagnosis not present

## 2022-10-17 DIAGNOSIS — Z9181 History of falling: Secondary | ICD-10-CM | POA: Diagnosis not present

## 2022-10-17 DIAGNOSIS — J449 Chronic obstructive pulmonary disease, unspecified: Secondary | ICD-10-CM | POA: Diagnosis not present

## 2022-10-17 DIAGNOSIS — E876 Hypokalemia: Secondary | ICD-10-CM | POA: Diagnosis not present

## 2022-10-17 DIAGNOSIS — I5032 Chronic diastolic (congestive) heart failure: Secondary | ICD-10-CM | POA: Diagnosis not present

## 2022-10-17 DIAGNOSIS — S43015D Anterior dislocation of left humerus, subsequent encounter: Secondary | ICD-10-CM | POA: Diagnosis not present

## 2022-10-17 DIAGNOSIS — I480 Paroxysmal atrial fibrillation: Secondary | ICD-10-CM | POA: Diagnosis not present

## 2022-10-17 DIAGNOSIS — N1832 Chronic kidney disease, stage 3b: Secondary | ICD-10-CM | POA: Diagnosis not present

## 2022-10-17 DIAGNOSIS — I10 Essential (primary) hypertension: Secondary | ICD-10-CM | POA: Diagnosis not present

## 2022-10-17 DIAGNOSIS — M6281 Muscle weakness (generalized): Secondary | ICD-10-CM | POA: Diagnosis not present

## 2022-10-17 DIAGNOSIS — I509 Heart failure, unspecified: Secondary | ICD-10-CM | POA: Diagnosis not present

## 2022-10-17 DIAGNOSIS — R296 Repeated falls: Secondary | ICD-10-CM | POA: Diagnosis not present

## 2022-10-17 DIAGNOSIS — S31809A Unspecified open wound of unspecified buttock, initial encounter: Secondary | ICD-10-CM | POA: Diagnosis not present

## 2022-10-17 DIAGNOSIS — R1110 Vomiting, unspecified: Secondary | ICD-10-CM | POA: Diagnosis not present

## 2022-10-17 DIAGNOSIS — J441 Chronic obstructive pulmonary disease with (acute) exacerbation: Secondary | ICD-10-CM | POA: Diagnosis not present

## 2022-10-17 DIAGNOSIS — L24A2 Irritant contact dermatitis due to fecal, urinary or dual incontinence: Secondary | ICD-10-CM | POA: Diagnosis not present

## 2022-10-17 DIAGNOSIS — M0680 Other specified rheumatoid arthritis, unspecified site: Secondary | ICD-10-CM | POA: Diagnosis not present

## 2022-10-17 DIAGNOSIS — Z4781 Encounter for orthopedic aftercare following surgical amputation: Secondary | ICD-10-CM | POA: Diagnosis not present

## 2022-10-17 DIAGNOSIS — R278 Other lack of coordination: Secondary | ICD-10-CM | POA: Diagnosis not present

## 2022-10-19 DIAGNOSIS — E876 Hypokalemia: Secondary | ICD-10-CM | POA: Diagnosis not present

## 2022-10-19 DIAGNOSIS — I130 Hypertensive heart and chronic kidney disease with heart failure and stage 1 through stage 4 chronic kidney disease, or unspecified chronic kidney disease: Secondary | ICD-10-CM | POA: Diagnosis not present

## 2022-10-19 DIAGNOSIS — N1832 Chronic kidney disease, stage 3b: Secondary | ICD-10-CM | POA: Diagnosis not present

## 2022-10-19 DIAGNOSIS — Z515 Encounter for palliative care: Secondary | ICD-10-CM | POA: Diagnosis not present

## 2022-10-19 DIAGNOSIS — J441 Chronic obstructive pulmonary disease with (acute) exacerbation: Secondary | ICD-10-CM | POA: Diagnosis not present

## 2022-10-19 DIAGNOSIS — I5032 Chronic diastolic (congestive) heart failure: Secondary | ICD-10-CM | POA: Diagnosis not present

## 2022-10-19 DIAGNOSIS — R296 Repeated falls: Secondary | ICD-10-CM | POA: Diagnosis not present

## 2022-10-19 DIAGNOSIS — D631 Anemia in chronic kidney disease: Secondary | ICD-10-CM | POA: Diagnosis not present

## 2022-10-19 DIAGNOSIS — M0680 Other specified rheumatoid arthritis, unspecified site: Secondary | ICD-10-CM | POA: Diagnosis not present

## 2022-10-19 DIAGNOSIS — M6281 Muscle weakness (generalized): Secondary | ICD-10-CM | POA: Diagnosis not present

## 2022-10-19 DIAGNOSIS — I10 Essential (primary) hypertension: Secondary | ICD-10-CM | POA: Diagnosis not present

## 2022-10-19 DIAGNOSIS — I480 Paroxysmal atrial fibrillation: Secondary | ICD-10-CM | POA: Diagnosis not present

## 2022-10-19 DIAGNOSIS — S43085A Other dislocation of left shoulder joint, initial encounter: Secondary | ICD-10-CM | POA: Diagnosis not present

## 2022-10-20 DIAGNOSIS — R278 Other lack of coordination: Secondary | ICD-10-CM | POA: Diagnosis not present

## 2022-10-20 DIAGNOSIS — Z4781 Encounter for orthopedic aftercare following surgical amputation: Secondary | ICD-10-CM | POA: Diagnosis not present

## 2022-10-20 DIAGNOSIS — Z9181 History of falling: Secondary | ICD-10-CM | POA: Diagnosis not present

## 2022-10-20 DIAGNOSIS — M6281 Muscle weakness (generalized): Secondary | ICD-10-CM | POA: Diagnosis not present

## 2022-10-20 DIAGNOSIS — I509 Heart failure, unspecified: Secondary | ICD-10-CM | POA: Diagnosis not present

## 2022-10-20 DIAGNOSIS — I480 Paroxysmal atrial fibrillation: Secondary | ICD-10-CM | POA: Diagnosis not present

## 2022-10-20 DIAGNOSIS — S43015D Anterior dislocation of left humerus, subsequent encounter: Secondary | ICD-10-CM | POA: Diagnosis not present

## 2022-10-20 DIAGNOSIS — J449 Chronic obstructive pulmonary disease, unspecified: Secondary | ICD-10-CM | POA: Diagnosis not present

## 2022-10-24 DIAGNOSIS — L24A2 Irritant contact dermatitis due to fecal, urinary or dual incontinence: Secondary | ICD-10-CM | POA: Diagnosis not present

## 2022-10-24 DIAGNOSIS — S71102A Unspecified open wound, left thigh, initial encounter: Secondary | ICD-10-CM | POA: Diagnosis not present

## 2022-10-24 DIAGNOSIS — S31809A Unspecified open wound of unspecified buttock, initial encounter: Secondary | ICD-10-CM | POA: Diagnosis not present

## 2022-10-27 DIAGNOSIS — I130 Hypertensive heart and chronic kidney disease with heart failure and stage 1 through stage 4 chronic kidney disease, or unspecified chronic kidney disease: Secondary | ICD-10-CM | POA: Diagnosis not present

## 2022-10-27 DIAGNOSIS — I10 Essential (primary) hypertension: Secondary | ICD-10-CM | POA: Diagnosis not present

## 2022-10-27 DIAGNOSIS — R296 Repeated falls: Secondary | ICD-10-CM | POA: Diagnosis not present

## 2022-10-27 DIAGNOSIS — J449 Chronic obstructive pulmonary disease, unspecified: Secondary | ICD-10-CM | POA: Diagnosis not present

## 2022-10-27 DIAGNOSIS — D631 Anemia in chronic kidney disease: Secondary | ICD-10-CM | POA: Diagnosis not present

## 2022-10-27 DIAGNOSIS — Z9181 History of falling: Secondary | ICD-10-CM | POA: Diagnosis not present

## 2022-10-27 DIAGNOSIS — I509 Heart failure, unspecified: Secondary | ICD-10-CM | POA: Diagnosis not present

## 2022-10-27 DIAGNOSIS — Z96612 Presence of left artificial shoulder joint: Secondary | ICD-10-CM | POA: Diagnosis not present

## 2022-10-27 DIAGNOSIS — S43015D Anterior dislocation of left humerus, subsequent encounter: Secondary | ICD-10-CM | POA: Diagnosis not present

## 2022-10-27 DIAGNOSIS — R278 Other lack of coordination: Secondary | ICD-10-CM | POA: Diagnosis not present

## 2022-10-27 DIAGNOSIS — N1832 Chronic kidney disease, stage 3b: Secondary | ICD-10-CM | POA: Diagnosis not present

## 2022-10-27 DIAGNOSIS — E876 Hypokalemia: Secondary | ICD-10-CM | POA: Diagnosis not present

## 2022-10-27 DIAGNOSIS — J441 Chronic obstructive pulmonary disease with (acute) exacerbation: Secondary | ICD-10-CM | POA: Diagnosis not present

## 2022-10-27 DIAGNOSIS — M6281 Muscle weakness (generalized): Secondary | ICD-10-CM | POA: Diagnosis not present

## 2022-10-27 DIAGNOSIS — Z96611 Presence of right artificial shoulder joint: Secondary | ICD-10-CM | POA: Diagnosis not present

## 2022-10-27 DIAGNOSIS — I480 Paroxysmal atrial fibrillation: Secondary | ICD-10-CM | POA: Diagnosis not present

## 2022-10-27 DIAGNOSIS — I5032 Chronic diastolic (congestive) heart failure: Secondary | ICD-10-CM | POA: Diagnosis not present

## 2022-10-27 DIAGNOSIS — M0680 Other specified rheumatoid arthritis, unspecified site: Secondary | ICD-10-CM | POA: Diagnosis not present

## 2022-10-27 DIAGNOSIS — S43085A Other dislocation of left shoulder joint, initial encounter: Secondary | ICD-10-CM | POA: Diagnosis not present

## 2022-10-27 DIAGNOSIS — Z4781 Encounter for orthopedic aftercare following surgical amputation: Secondary | ICD-10-CM | POA: Diagnosis not present

## 2022-10-31 DIAGNOSIS — I509 Heart failure, unspecified: Secondary | ICD-10-CM | POA: Diagnosis not present

## 2022-10-31 DIAGNOSIS — J449 Chronic obstructive pulmonary disease, unspecified: Secondary | ICD-10-CM | POA: Diagnosis not present

## 2022-10-31 DIAGNOSIS — Z9181 History of falling: Secondary | ICD-10-CM | POA: Diagnosis not present

## 2022-10-31 DIAGNOSIS — L24A2 Irritant contact dermatitis due to fecal, urinary or dual incontinence: Secondary | ICD-10-CM | POA: Diagnosis not present

## 2022-10-31 DIAGNOSIS — L988 Other specified disorders of the skin and subcutaneous tissue: Secondary | ICD-10-CM | POA: Diagnosis not present

## 2022-10-31 DIAGNOSIS — I480 Paroxysmal atrial fibrillation: Secondary | ICD-10-CM | POA: Diagnosis not present

## 2022-10-31 DIAGNOSIS — M6281 Muscle weakness (generalized): Secondary | ICD-10-CM | POA: Diagnosis not present

## 2022-10-31 DIAGNOSIS — R278 Other lack of coordination: Secondary | ICD-10-CM | POA: Diagnosis not present

## 2022-10-31 DIAGNOSIS — S71102A Unspecified open wound, left thigh, initial encounter: Secondary | ICD-10-CM | POA: Diagnosis not present

## 2022-10-31 DIAGNOSIS — Z4781 Encounter for orthopedic aftercare following surgical amputation: Secondary | ICD-10-CM | POA: Diagnosis not present

## 2022-10-31 DIAGNOSIS — S43015D Anterior dislocation of left humerus, subsequent encounter: Secondary | ICD-10-CM | POA: Diagnosis not present

## 2022-10-31 DIAGNOSIS — S31809A Unspecified open wound of unspecified buttock, initial encounter: Secondary | ICD-10-CM | POA: Diagnosis not present

## 2022-11-02 DIAGNOSIS — Z515 Encounter for palliative care: Secondary | ICD-10-CM | POA: Diagnosis not present

## 2022-11-03 DIAGNOSIS — S43015D Anterior dislocation of left humerus, subsequent encounter: Secondary | ICD-10-CM | POA: Diagnosis not present

## 2022-11-03 DIAGNOSIS — R278 Other lack of coordination: Secondary | ICD-10-CM | POA: Diagnosis not present

## 2022-11-03 DIAGNOSIS — Z9181 History of falling: Secondary | ICD-10-CM | POA: Diagnosis not present

## 2022-11-03 DIAGNOSIS — Z4781 Encounter for orthopedic aftercare following surgical amputation: Secondary | ICD-10-CM | POA: Diagnosis not present

## 2022-11-03 DIAGNOSIS — J449 Chronic obstructive pulmonary disease, unspecified: Secondary | ICD-10-CM | POA: Diagnosis not present

## 2022-11-03 DIAGNOSIS — I480 Paroxysmal atrial fibrillation: Secondary | ICD-10-CM | POA: Diagnosis not present

## 2022-11-03 DIAGNOSIS — M6281 Muscle weakness (generalized): Secondary | ICD-10-CM | POA: Diagnosis not present

## 2022-11-03 DIAGNOSIS — I509 Heart failure, unspecified: Secondary | ICD-10-CM | POA: Diagnosis not present

## 2022-11-07 DIAGNOSIS — I5032 Chronic diastolic (congestive) heart failure: Secondary | ICD-10-CM | POA: Diagnosis not present

## 2022-11-07 DIAGNOSIS — L24A2 Irritant contact dermatitis due to fecal, urinary or dual incontinence: Secondary | ICD-10-CM | POA: Diagnosis not present

## 2022-11-07 DIAGNOSIS — S31809A Unspecified open wound of unspecified buttock, initial encounter: Secondary | ICD-10-CM | POA: Diagnosis not present

## 2022-11-07 DIAGNOSIS — N1832 Chronic kidney disease, stage 3b: Secondary | ICD-10-CM | POA: Diagnosis not present

## 2022-11-07 DIAGNOSIS — I130 Hypertensive heart and chronic kidney disease with heart failure and stage 1 through stage 4 chronic kidney disease, or unspecified chronic kidney disease: Secondary | ICD-10-CM | POA: Diagnosis not present

## 2022-11-07 DIAGNOSIS — M0680 Other specified rheumatoid arthritis, unspecified site: Secondary | ICD-10-CM | POA: Diagnosis not present

## 2022-11-07 DIAGNOSIS — S71102A Unspecified open wound, left thigh, initial encounter: Secondary | ICD-10-CM | POA: Diagnosis not present

## 2022-11-07 DIAGNOSIS — I10 Essential (primary) hypertension: Secondary | ICD-10-CM | POA: Diagnosis not present

## 2022-11-07 DIAGNOSIS — I480 Paroxysmal atrial fibrillation: Secondary | ICD-10-CM | POA: Diagnosis not present

## 2022-11-07 DIAGNOSIS — J449 Chronic obstructive pulmonary disease, unspecified: Secondary | ICD-10-CM | POA: Diagnosis not present

## 2022-11-07 DIAGNOSIS — I509 Heart failure, unspecified: Secondary | ICD-10-CM | POA: Diagnosis not present

## 2022-11-07 DIAGNOSIS — M6281 Muscle weakness (generalized): Secondary | ICD-10-CM | POA: Diagnosis not present

## 2022-11-07 DIAGNOSIS — R278 Other lack of coordination: Secondary | ICD-10-CM | POA: Diagnosis not present

## 2022-11-07 DIAGNOSIS — R109 Unspecified abdominal pain: Secondary | ICD-10-CM | POA: Diagnosis not present

## 2022-11-07 DIAGNOSIS — Z9181 History of falling: Secondary | ICD-10-CM | POA: Diagnosis not present

## 2022-11-07 DIAGNOSIS — L988 Other specified disorders of the skin and subcutaneous tissue: Secondary | ICD-10-CM | POA: Diagnosis not present

## 2022-11-07 DIAGNOSIS — S43015D Anterior dislocation of left humerus, subsequent encounter: Secondary | ICD-10-CM | POA: Diagnosis not present

## 2022-11-07 DIAGNOSIS — Z4781 Encounter for orthopedic aftercare following surgical amputation: Secondary | ICD-10-CM | POA: Diagnosis not present

## 2022-11-07 DIAGNOSIS — S43085A Other dislocation of left shoulder joint, initial encounter: Secondary | ICD-10-CM | POA: Diagnosis not present

## 2022-11-10 DIAGNOSIS — J449 Chronic obstructive pulmonary disease, unspecified: Secondary | ICD-10-CM | POA: Diagnosis not present

## 2022-11-10 DIAGNOSIS — R278 Other lack of coordination: Secondary | ICD-10-CM | POA: Diagnosis not present

## 2022-11-10 DIAGNOSIS — Z9181 History of falling: Secondary | ICD-10-CM | POA: Diagnosis not present

## 2022-11-10 DIAGNOSIS — I509 Heart failure, unspecified: Secondary | ICD-10-CM | POA: Diagnosis not present

## 2022-11-10 DIAGNOSIS — M6281 Muscle weakness (generalized): Secondary | ICD-10-CM | POA: Diagnosis not present

## 2022-11-10 DIAGNOSIS — I480 Paroxysmal atrial fibrillation: Secondary | ICD-10-CM | POA: Diagnosis not present

## 2022-11-10 DIAGNOSIS — Z4781 Encounter for orthopedic aftercare following surgical amputation: Secondary | ICD-10-CM | POA: Diagnosis not present

## 2022-11-10 DIAGNOSIS — S43015D Anterior dislocation of left humerus, subsequent encounter: Secondary | ICD-10-CM | POA: Diagnosis not present

## 2022-11-14 DIAGNOSIS — I509 Heart failure, unspecified: Secondary | ICD-10-CM | POA: Diagnosis not present

## 2022-11-14 DIAGNOSIS — I480 Paroxysmal atrial fibrillation: Secondary | ICD-10-CM | POA: Diagnosis not present

## 2022-11-14 DIAGNOSIS — J449 Chronic obstructive pulmonary disease, unspecified: Secondary | ICD-10-CM | POA: Diagnosis not present

## 2022-11-14 DIAGNOSIS — Z4781 Encounter for orthopedic aftercare following surgical amputation: Secondary | ICD-10-CM | POA: Diagnosis not present

## 2022-11-14 DIAGNOSIS — M6281 Muscle weakness (generalized): Secondary | ICD-10-CM | POA: Diagnosis not present

## 2022-11-14 DIAGNOSIS — S43015D Anterior dislocation of left humerus, subsequent encounter: Secondary | ICD-10-CM | POA: Diagnosis not present

## 2022-11-14 DIAGNOSIS — Z9181 History of falling: Secondary | ICD-10-CM | POA: Diagnosis not present

## 2022-11-14 DIAGNOSIS — L24A2 Irritant contact dermatitis due to fecal, urinary or dual incontinence: Secondary | ICD-10-CM | POA: Diagnosis not present

## 2022-11-14 DIAGNOSIS — R278 Other lack of coordination: Secondary | ICD-10-CM | POA: Diagnosis not present

## 2022-11-14 DIAGNOSIS — S31809A Unspecified open wound of unspecified buttock, initial encounter: Secondary | ICD-10-CM | POA: Diagnosis not present

## 2022-11-14 DIAGNOSIS — L988 Other specified disorders of the skin and subcutaneous tissue: Secondary | ICD-10-CM | POA: Diagnosis not present

## 2022-11-14 DIAGNOSIS — S71102A Unspecified open wound, left thigh, initial encounter: Secondary | ICD-10-CM | POA: Diagnosis not present

## 2022-11-16 DIAGNOSIS — R296 Repeated falls: Secondary | ICD-10-CM | POA: Diagnosis not present

## 2022-11-16 DIAGNOSIS — M0680 Other specified rheumatoid arthritis, unspecified site: Secondary | ICD-10-CM | POA: Diagnosis not present

## 2022-11-16 DIAGNOSIS — I480 Paroxysmal atrial fibrillation: Secondary | ICD-10-CM | POA: Diagnosis not present

## 2022-11-16 DIAGNOSIS — I10 Essential (primary) hypertension: Secondary | ICD-10-CM | POA: Diagnosis not present

## 2022-11-16 DIAGNOSIS — M6281 Muscle weakness (generalized): Secondary | ICD-10-CM | POA: Diagnosis not present

## 2022-11-16 DIAGNOSIS — N1832 Chronic kidney disease, stage 3b: Secondary | ICD-10-CM | POA: Diagnosis not present

## 2022-11-16 DIAGNOSIS — S43085A Other dislocation of left shoulder joint, initial encounter: Secondary | ICD-10-CM | POA: Diagnosis not present

## 2022-11-16 DIAGNOSIS — I5032 Chronic diastolic (congestive) heart failure: Secondary | ICD-10-CM | POA: Diagnosis not present

## 2022-11-16 DIAGNOSIS — J449 Chronic obstructive pulmonary disease, unspecified: Secondary | ICD-10-CM | POA: Diagnosis not present

## 2022-11-21 DIAGNOSIS — L988 Other specified disorders of the skin and subcutaneous tissue: Secondary | ICD-10-CM | POA: Diagnosis not present

## 2022-11-21 DIAGNOSIS — S71102A Unspecified open wound, left thigh, initial encounter: Secondary | ICD-10-CM | POA: Diagnosis not present

## 2022-11-21 DIAGNOSIS — S31809A Unspecified open wound of unspecified buttock, initial encounter: Secondary | ICD-10-CM | POA: Diagnosis not present

## 2022-11-21 DIAGNOSIS — L24A2 Irritant contact dermatitis due to fecal, urinary or dual incontinence: Secondary | ICD-10-CM | POA: Diagnosis not present

## 2022-11-28 DIAGNOSIS — L988 Other specified disorders of the skin and subcutaneous tissue: Secondary | ICD-10-CM | POA: Diagnosis not present

## 2022-11-28 DIAGNOSIS — S31809A Unspecified open wound of unspecified buttock, initial encounter: Secondary | ICD-10-CM | POA: Diagnosis not present

## 2022-11-28 DIAGNOSIS — S71102A Unspecified open wound, left thigh, initial encounter: Secondary | ICD-10-CM | POA: Diagnosis not present

## 2022-11-28 DIAGNOSIS — L24A2 Irritant contact dermatitis due to fecal, urinary or dual incontinence: Secondary | ICD-10-CM | POA: Diagnosis not present

## 2022-11-28 DIAGNOSIS — Z23 Encounter for immunization: Secondary | ICD-10-CM | POA: Diagnosis not present

## 2022-11-30 DIAGNOSIS — J449 Chronic obstructive pulmonary disease, unspecified: Secondary | ICD-10-CM | POA: Diagnosis not present

## 2022-11-30 DIAGNOSIS — I480 Paroxysmal atrial fibrillation: Secondary | ICD-10-CM | POA: Diagnosis not present

## 2022-11-30 DIAGNOSIS — I5032 Chronic diastolic (congestive) heart failure: Secondary | ICD-10-CM | POA: Diagnosis not present

## 2022-11-30 DIAGNOSIS — M0680 Other specified rheumatoid arthritis, unspecified site: Secondary | ICD-10-CM | POA: Diagnosis not present

## 2022-11-30 DIAGNOSIS — S43085A Other dislocation of left shoulder joint, initial encounter: Secondary | ICD-10-CM | POA: Diagnosis not present

## 2022-11-30 DIAGNOSIS — M6281 Muscle weakness (generalized): Secondary | ICD-10-CM | POA: Diagnosis not present

## 2022-11-30 DIAGNOSIS — N1832 Chronic kidney disease, stage 3b: Secondary | ICD-10-CM | POA: Diagnosis not present

## 2022-12-01 DIAGNOSIS — Z96612 Presence of left artificial shoulder joint: Secondary | ICD-10-CM | POA: Diagnosis not present

## 2022-12-01 DIAGNOSIS — T84028S Dislocation of other internal joint prosthesis, sequela: Secondary | ICD-10-CM | POA: Diagnosis not present

## 2022-12-01 DIAGNOSIS — Z96611 Presence of right artificial shoulder joint: Secondary | ICD-10-CM | POA: Diagnosis not present

## 2022-12-01 DIAGNOSIS — S42121G Displaced fracture of acromial process, right shoulder, subsequent encounter for fracture with delayed healing: Secondary | ICD-10-CM | POA: Diagnosis not present

## 2022-12-01 DIAGNOSIS — T8131XD Disruption of external operation (surgical) wound, not elsewhere classified, subsequent encounter: Secondary | ICD-10-CM | POA: Diagnosis not present

## 2022-12-05 DIAGNOSIS — L24A2 Irritant contact dermatitis due to fecal, urinary or dual incontinence: Secondary | ICD-10-CM | POA: Diagnosis not present

## 2022-12-05 DIAGNOSIS — S31809A Unspecified open wound of unspecified buttock, initial encounter: Secondary | ICD-10-CM | POA: Diagnosis not present

## 2022-12-05 DIAGNOSIS — L988 Other specified disorders of the skin and subcutaneous tissue: Secondary | ICD-10-CM | POA: Diagnosis not present

## 2022-12-05 DIAGNOSIS — S71102A Unspecified open wound, left thigh, initial encounter: Secondary | ICD-10-CM | POA: Diagnosis not present

## 2022-12-08 DIAGNOSIS — N1832 Chronic kidney disease, stage 3b: Secondary | ICD-10-CM | POA: Diagnosis not present

## 2022-12-08 DIAGNOSIS — I5032 Chronic diastolic (congestive) heart failure: Secondary | ICD-10-CM | POA: Diagnosis not present

## 2022-12-08 DIAGNOSIS — J449 Chronic obstructive pulmonary disease, unspecified: Secondary | ICD-10-CM | POA: Diagnosis not present

## 2022-12-08 DIAGNOSIS — M0680 Other specified rheumatoid arthritis, unspecified site: Secondary | ICD-10-CM | POA: Diagnosis not present

## 2022-12-08 DIAGNOSIS — M6281 Muscle weakness (generalized): Secondary | ICD-10-CM | POA: Diagnosis not present

## 2022-12-08 DIAGNOSIS — I480 Paroxysmal atrial fibrillation: Secondary | ICD-10-CM | POA: Diagnosis not present

## 2022-12-08 DIAGNOSIS — S43085A Other dislocation of left shoulder joint, initial encounter: Secondary | ICD-10-CM | POA: Diagnosis not present

## 2022-12-08 DIAGNOSIS — R296 Repeated falls: Secondary | ICD-10-CM | POA: Diagnosis not present

## 2022-12-08 DIAGNOSIS — I10 Essential (primary) hypertension: Secondary | ICD-10-CM | POA: Diagnosis not present

## 2022-12-11 ENCOUNTER — Telehealth: Payer: Self-pay

## 2022-12-11 NOTE — Transitions of Care (Post Inpatient/ED Visit) (Unsigned)
12/11/2022  Name: Tina Pacheco MRN: [id number] DOB: 11-05-1950  Today's TOC FU Call Status: Today's TOC FU Call Status:: Unsuccessful Call (1st Attempt) Unsuccessful Call (1st Attempt) Date: 12/11/22  Attempted to reach the patient regarding the most recent Inpatient/ED visit.  Follow Up Plan: Additional outreach attempts will be made to reach the patient to complete the Transitions of Care (Post Inpatient/ED visit) call.   Signature Karena Addison, LPN [facility] Nurse Health Advisor Direct Dial [phone]

## 2022-12-12 DIAGNOSIS — K5900 Constipation, unspecified: Secondary | ICD-10-CM | POA: Diagnosis not present

## 2022-12-12 DIAGNOSIS — L89312 Pressure ulcer of right buttock, stage 2: Secondary | ICD-10-CM | POA: Diagnosis not present

## 2022-12-12 DIAGNOSIS — I2510 Atherosclerotic heart disease of native coronary artery without angina pectoris: Secondary | ICD-10-CM | POA: Diagnosis not present

## 2022-12-12 DIAGNOSIS — L89152 Pressure ulcer of sacral region, stage 2: Secondary | ICD-10-CM | POA: Diagnosis not present

## 2022-12-12 DIAGNOSIS — M353 Polymyalgia rheumatica: Secondary | ICD-10-CM | POA: Diagnosis not present

## 2022-12-12 DIAGNOSIS — I480 Paroxysmal atrial fibrillation: Secondary | ICD-10-CM | POA: Diagnosis not present

## 2022-12-12 DIAGNOSIS — I130 Hypertensive heart and chronic kidney disease with heart failure and stage 1 through stage 4 chronic kidney disease, or unspecified chronic kidney disease: Secondary | ICD-10-CM | POA: Diagnosis not present

## 2022-12-12 DIAGNOSIS — S43012D Anterior subluxation of left humerus, subsequent encounter: Secondary | ICD-10-CM | POA: Diagnosis not present

## 2022-12-12 DIAGNOSIS — G4733 Obstructive sleep apnea (adult) (pediatric): Secondary | ICD-10-CM | POA: Diagnosis not present

## 2022-12-12 DIAGNOSIS — M1990 Unspecified osteoarthritis, unspecified site: Secondary | ICD-10-CM | POA: Diagnosis not present

## 2022-12-12 DIAGNOSIS — M51379 Other intervertebral disc degeneration, lumbosacral region without mention of lumbar back pain or lower extremity pain: Secondary | ICD-10-CM | POA: Diagnosis not present

## 2022-12-12 DIAGNOSIS — J4489 Other specified chronic obstructive pulmonary disease: Secondary | ICD-10-CM | POA: Diagnosis not present

## 2022-12-12 DIAGNOSIS — G2581 Restless legs syndrome: Secondary | ICD-10-CM | POA: Diagnosis not present

## 2022-12-12 DIAGNOSIS — G894 Chronic pain syndrome: Secondary | ICD-10-CM | POA: Diagnosis not present

## 2022-12-12 DIAGNOSIS — K219 Gastro-esophageal reflux disease without esophagitis: Secondary | ICD-10-CM | POA: Diagnosis not present

## 2022-12-12 DIAGNOSIS — D631 Anemia in chronic kidney disease: Secondary | ICD-10-CM | POA: Diagnosis not present

## 2022-12-12 DIAGNOSIS — E1122 Type 2 diabetes mellitus with diabetic chronic kidney disease: Secondary | ICD-10-CM | POA: Diagnosis not present

## 2022-12-12 DIAGNOSIS — I5032 Chronic diastolic (congestive) heart failure: Secondary | ICD-10-CM | POA: Diagnosis not present

## 2022-12-12 DIAGNOSIS — L89322 Pressure ulcer of left buttock, stage 2: Secondary | ICD-10-CM | POA: Diagnosis not present

## 2022-12-12 DIAGNOSIS — G4700 Insomnia, unspecified: Secondary | ICD-10-CM | POA: Diagnosis not present

## 2022-12-12 DIAGNOSIS — N1832 Chronic kidney disease, stage 3b: Secondary | ICD-10-CM | POA: Diagnosis not present

## 2022-12-12 DIAGNOSIS — M797 Fibromyalgia: Secondary | ICD-10-CM | POA: Diagnosis not present

## 2022-12-12 DIAGNOSIS — M0680 Other specified rheumatoid arthritis, unspecified site: Secondary | ICD-10-CM | POA: Diagnosis not present

## 2022-12-12 DIAGNOSIS — G43909 Migraine, unspecified, not intractable, without status migrainosus: Secondary | ICD-10-CM | POA: Diagnosis not present

## 2022-12-12 DIAGNOSIS — E782 Mixed hyperlipidemia: Secondary | ICD-10-CM | POA: Diagnosis not present

## 2022-12-12 NOTE — Transitions of Care (Post Inpatient/ED Visit) (Signed)
12/12/2022  Name: Tina Pacheco MRN: [id number] DOB: 05/05/1950  Today's TOC FU Call Status: Today's TOC FU Call Status:: Successful TOC FU Call Completed Unsuccessful Call (1st Attempt) Date: 12/11/22 [facility] FU Call Complete Date: 12/12/22 Patient's Name and Date of Birth confirmed.  Transition Care Management Follow-up Telephone Call Date of Discharge: 12/10/22 Discharge Facility: Other ([facility] Facility) Name of Other ([facility]) [facility]charge Facility[facility] Type of Discharge: Inpatient Admission How have you been since you were released from the hospital?: Better Any questions or concerns?: No  Items Reviewed: Did you receive and understand the discharge instructions provided?: Yes Medications obtained,verified, and reconciled?: Yes (Medications Reviewed) Any new allergies since your discharge?: No Dietary orders reviewed?: Yes Do you have support at home?: Yes People in Home: child(ren), adult  Medications Reviewed Today: Medications Reviewed Today     Reviewed by Karena Addison, LPN (Licensed Practical Nurse) on 12/12/22 at 1246  Med List Status: <None>   Medication Order Taking? Sig Documenting Provider Last Dose Status Informant  amiodarone (PACERONE) 200 MG tablet [id number] No TAKE 1 TABLET(200 MG) BY MOUTH DAILY Gollan, Tollie Pizza, MD Taking Active   apixaban (ELIQUIS) 5 MG TABS tablet [id number] No Take 1 tablet (5 mg total) by mouth 2 (two) times daily. Sondra Barges, PA-C Taking Active   atorvastatin (LIPITOR) 40 MG tablet [id number] No TAKE 1 TABLET(40 MG) BY MOUTH DAILY Karie Schwalbe, MD Taking Active   benzonatate (TESSALON) 100 MG capsule [id number]  Take 1 capsule (100 mg total) by mouth 2 (two) times daily as needed for cough. Delfino Lovett, MD  Active   bisacodyl (DULCOLAX) 10 MG suppository [id number]  Place 1 suppository (10 mg total) rectally daily as needed for moderate constipation. Delfino Lovett, MD  Active   Certolizumab [facility]) [id number] No  Inject into the skin. Patient states taking 1 x per month [provider] 09/19/2022 Active Self  HYDROcodone-acetaminophen (NORCO/VICODIN) 5-325 MG tablet [id number]  Take 1-2 tablets by mouth every 6 (six) hours as needed for severe pain. Anson Oregon, PA-C  Active   ketoconazole (NIZORAL) 2 % cream [id number] No Apply 1 Application topically as needed. [provider] Taking Active   metolazone (ZAROXOLYN) 2.5 MG tablet [id number] No TAKE 1 TABLET BY MOUTH DAILY AS NEEDED. MAY GIVE 1 TABLET AS NEEDED FOR WEIGHT GAIN OF 5 LB IN 1 WEEK Letvak, Richard I, MD Taking Active   metoprolol succinate (TOPROL-XL) 50 MG 24 hr tablet [id number] No TAKE 1 TABLET BY MOUTH EVERY DAY WITH OR IMMEDIATELY FOLLOWING A MEAL  Patient taking differently: Take 50 mg by mouth at bedtime. TAKE 1 TABLET BY MOUTH EVERY DAY WITH OR IMMEDIATELY FOLLOWING A MEAL   Tillman Abide I, MD 10/01/2022 Active   montelukast (SINGULAIR) 10 MG tablet [id number] No TAKE 1 TABLET BY MOUTH AT BEDTIME Karie Schwalbe, MD Taking Active   nitroGLYCERIN (NITROSTAT) 0.4 MG SL tablet [id number] No Place 1 tablet (0.4 mg total) under the tongue every 5 (five) minutes as needed for chest pain. Enedina Finner, MD Taking Active Self           Med Note (REGISTER, KAREN A   Tue Jun 27, 2022  6:27 AM) Hasn't needed this med  nystatin (MYCOSTATIN/NYSTOP) powder [id number] No Apply 1 application topically 3 (three) times daily as needed. Medina-Vargas, Monina C, NP Taking Active Self  omeprazole (PRILOSEC) 40 MG capsule [id number] No Take 1 capsule (40 mg total) by mouth at bedtime. Karie Schwalbe, MD  Taking Active   ondansetron (ZOFRAN-ODT) 4 MG disintegrating tablet [id number] No DISSOLVE 1 TABLET(4 MG) ON THE TONGUE EVERY 8 HOURS AS NEEDED FOR NAUSEA OR VOMITING Karie Schwalbe, MD Taking Active   polyethylene glycol [facility] / GLYCOLAX) packet [id number] No Take 17 g by mouth daily as needed for mild constipation. Houston Siren, MD  Taking Active Self  predniSONE (DELTASONE) 5 MG tablet [id number] No Take 1 tablet (5 mg total) by mouth daily with breakfast.  Patient taking differently: Take 5 mg by mouth at bedtime.   Karie Schwalbe, MD Taking Active   promethazine (PHENERGAN) 12.5 MG tablet [id number] No Take 1 tablet (12.5 mg total) by mouth every 6 (six) hours as needed for nausea or vomiting. Zehr, Princella Pellegrini, PA-C Taking Active   senna-docusate (SENOKOT-S) 8.6-50 MG tablet [id number]  Take 2 tablets by mouth at bedtime. Delfino Lovett, MD  Active   spironolactone (ALDACTONE) 25 MG tablet [id number] No TAKE 1 TABLET(25 MG) BY MOUTH DAILY Karie Schwalbe, MD Taking Active   terazosin (HYTRIN) 10 MG capsule [id number] No TAKE 1 CAPSULE(10 MG) BY MOUTH AT BEDTIME Karie Schwalbe, MD Taking Active   torsemide (DEMADEX) 20 MG tablet [id number] No Take 1 tablet (20 mg total) by mouth daily.  Patient taking differently: Take 40 mg by mouth daily.   Sondra Barges, PA-C Taking Active   Med List Note Sharia Reeve, CPhT 01/05/20 2031):              Home Care and Equipment/Supplies: Were Home Health Services Ordered?: Yes Name of Home Health Agency:: [facility] Has Agency set up a time to come to your home?: Yes First Home Health Visit Date: 12/12/22 Any new equipment or medical supplies ordered?: Yes Name of Medical supply agency?: [facility] Were you able to get the equipment/medical supplies?: No Do you have any questions related to the use of the equipment/supplies?: No  Functional Questionnaire: Do you need assistance with bathing/showering or dressing?: Yes Do you need assistance with meal preparation?: Yes Do you need assistance with eating?: No Do you have difficulty maintaining continence: Yes Do you need assistance with getting out of bed/getting out of a chair/moving?: Yes Do you have difficulty managing or taking your medications?: Yes  Follow up appointments reviewed: PCP Follow-up appointment  confirmed?: NA Specialist Hospital Follow-up appointment confirmed?: Yes Date of Specialist follow-up appointment?: 12/13/22 Follow-Up Specialty Provider:: surgeon Do you need transportation to your follow-up appointment?: No Do you understand care options if your condition(s) worsen?: Yes-patient verbalized understanding    SIGNATURE Karena Addison, LPN [facility] Nurse Health Advisor Direct Dial [phone]

## 2022-12-13 ENCOUNTER — Telehealth: Payer: Self-pay | Admitting: Internal Medicine

## 2022-12-13 DIAGNOSIS — M0579 Rheumatoid arthritis with rheumatoid factor of multiple sites without organ or systems involvement: Secondary | ICD-10-CM | POA: Diagnosis not present

## 2022-12-13 NOTE — Telephone Encounter (Signed)
William from [facility] called in and wanted to know if Dr. Alphonsus Sias would follow and accept orders for home health.

## 2022-12-14 ENCOUNTER — Telehealth: Payer: Self-pay | Admitting: Internal Medicine

## 2022-12-14 ENCOUNTER — Other Ambulatory Visit: Payer: Self-pay | Admitting: Internal Medicine

## 2022-12-14 DIAGNOSIS — I130 Hypertensive heart and chronic kidney disease with heart failure and stage 1 through stage 4 chronic kidney disease, or unspecified chronic kidney disease: Secondary | ICD-10-CM | POA: Diagnosis not present

## 2022-12-14 DIAGNOSIS — I5032 Chronic diastolic (congestive) heart failure: Secondary | ICD-10-CM | POA: Diagnosis not present

## 2022-12-14 DIAGNOSIS — M0680 Other specified rheumatoid arthritis, unspecified site: Secondary | ICD-10-CM | POA: Diagnosis not present

## 2022-12-14 DIAGNOSIS — N1832 Chronic kidney disease, stage 3b: Secondary | ICD-10-CM | POA: Diagnosis not present

## 2022-12-14 DIAGNOSIS — S43012D Anterior subluxation of left humerus, subsequent encounter: Secondary | ICD-10-CM | POA: Diagnosis not present

## 2022-12-14 DIAGNOSIS — E1122 Type 2 diabetes mellitus with diabetic chronic kidney disease: Secondary | ICD-10-CM | POA: Diagnosis not present

## 2022-12-14 MED ORDER — OXYCODONE HCL 10 MG PO TABS: 10.0000 mg | ORAL_TABLET | Freq: Two times a day (BID) | ORAL | 0 refills | Status: DC

## 2022-12-14 NOTE — Telephone Encounter (Signed)
Spoke to [place].

## 2022-12-14 NOTE — Telephone Encounter (Signed)
Prescription Request  12/14/2022  LOV: 09/18/2022  What is the name of the medication or equipment?    Have you contacted your pharmacy to request a refill? No   Which pharmacy would you like this sent to?  [id number] - [place], [place] - [address] AT [facility] [address] [facility] [address] [place] [address] Phone: [phone] Fax: [phone]    Patient notified that their request is being sent to the clinical staff for review and that they should receive a response within 2 business days.   Please advise at Mobile [phone] (mobile)

## 2022-12-14 NOTE — Telephone Encounter (Signed)
Home Health verbal orders Caller Name:Kate, PT Agency Name: [facility]  Callback number: [phone]  Requesting OT/PT/Skilled nursing/Social Work/Speech: PT  Reason: Leaving skilled nursing facility, work on strength, transfers and ambulation   Frequency: 1wk 9  Please forward to [facility] pool or providers CMA

## 2022-12-14 NOTE — Telephone Encounter (Signed)
Name of Medication: Oxycodone 20mg  Name of Pharmacy: [facility] [address] Last [place] or Written Date and Quantity: #60 Last Office Visit and Type: 09-18-22 Next Office Visit and Type: 01-19-23 Last Controlled Substance Agreement Date: 03-30-22 Last UDS: 03-30-22  Spoke with pt. Nursing Home had her on hydrocodone. Now that she is back home, she wants to go back on Oxycodone 20mg  2 times daily

## 2022-12-15 NOTE — Telephone Encounter (Signed)
Verbal orders given to Kate.  

## 2022-12-18 DIAGNOSIS — M0680 Other specified rheumatoid arthritis, unspecified site: Secondary | ICD-10-CM | POA: Diagnosis not present

## 2022-12-18 DIAGNOSIS — I5032 Chronic diastolic (congestive) heart failure: Secondary | ICD-10-CM | POA: Diagnosis not present

## 2022-12-18 DIAGNOSIS — I130 Hypertensive heart and chronic kidney disease with heart failure and stage 1 through stage 4 chronic kidney disease, or unspecified chronic kidney disease: Secondary | ICD-10-CM | POA: Diagnosis not present

## 2022-12-18 DIAGNOSIS — N1832 Chronic kidney disease, stage 3b: Secondary | ICD-10-CM | POA: Diagnosis not present

## 2022-12-18 DIAGNOSIS — E1122 Type 2 diabetes mellitus with diabetic chronic kidney disease: Secondary | ICD-10-CM | POA: Diagnosis not present

## 2022-12-18 DIAGNOSIS — S43012D Anterior subluxation of left humerus, subsequent encounter: Secondary | ICD-10-CM | POA: Diagnosis not present

## 2022-12-20 DIAGNOSIS — M0680 Other specified rheumatoid arthritis, unspecified site: Secondary | ICD-10-CM | POA: Diagnosis not present

## 2022-12-20 DIAGNOSIS — I130 Hypertensive heart and chronic kidney disease with heart failure and stage 1 through stage 4 chronic kidney disease, or unspecified chronic kidney disease: Secondary | ICD-10-CM | POA: Diagnosis not present

## 2022-12-20 DIAGNOSIS — E1122 Type 2 diabetes mellitus with diabetic chronic kidney disease: Secondary | ICD-10-CM | POA: Diagnosis not present

## 2022-12-20 DIAGNOSIS — S43012D Anterior subluxation of left humerus, subsequent encounter: Secondary | ICD-10-CM | POA: Diagnosis not present

## 2022-12-20 DIAGNOSIS — I5032 Chronic diastolic (congestive) heart failure: Secondary | ICD-10-CM | POA: Diagnosis not present

## 2022-12-20 DIAGNOSIS — N1832 Chronic kidney disease, stage 3b: Secondary | ICD-10-CM | POA: Diagnosis not present

## 2022-12-21 DIAGNOSIS — M0680 Other specified rheumatoid arthritis, unspecified site: Secondary | ICD-10-CM | POA: Diagnosis not present

## 2022-12-21 DIAGNOSIS — E1122 Type 2 diabetes mellitus with diabetic chronic kidney disease: Secondary | ICD-10-CM | POA: Diagnosis not present

## 2022-12-21 DIAGNOSIS — I5032 Chronic diastolic (congestive) heart failure: Secondary | ICD-10-CM | POA: Diagnosis not present

## 2022-12-21 DIAGNOSIS — N1832 Chronic kidney disease, stage 3b: Secondary | ICD-10-CM | POA: Diagnosis not present

## 2022-12-21 DIAGNOSIS — S43012D Anterior subluxation of left humerus, subsequent encounter: Secondary | ICD-10-CM | POA: Diagnosis not present

## 2022-12-21 DIAGNOSIS — I130 Hypertensive heart and chronic kidney disease with heart failure and stage 1 through stage 4 chronic kidney disease, or unspecified chronic kidney disease: Secondary | ICD-10-CM | POA: Diagnosis not present

## 2022-12-22 DIAGNOSIS — S43012D Anterior subluxation of left humerus, subsequent encounter: Secondary | ICD-10-CM | POA: Diagnosis not present

## 2022-12-22 DIAGNOSIS — I5032 Chronic diastolic (congestive) heart failure: Secondary | ICD-10-CM | POA: Diagnosis not present

## 2022-12-22 DIAGNOSIS — N1832 Chronic kidney disease, stage 3b: Secondary | ICD-10-CM | POA: Diagnosis not present

## 2022-12-22 DIAGNOSIS — I130 Hypertensive heart and chronic kidney disease with heart failure and stage 1 through stage 4 chronic kidney disease, or unspecified chronic kidney disease: Secondary | ICD-10-CM | POA: Diagnosis not present

## 2022-12-22 DIAGNOSIS — M0680 Other specified rheumatoid arthritis, unspecified site: Secondary | ICD-10-CM | POA: Diagnosis not present

## 2022-12-22 DIAGNOSIS — E1122 Type 2 diabetes mellitus with diabetic chronic kidney disease: Secondary | ICD-10-CM | POA: Diagnosis not present

## 2022-12-25 ENCOUNTER — Telehealth: Payer: Self-pay | Admitting: Internal Medicine

## 2022-12-25 NOTE — Telephone Encounter (Signed)
Verbal orders have been given to [facility] as requested.

## 2022-12-25 NOTE — Telephone Encounter (Signed)
Home Health verbal orders Caller Name: Annabelle Harman Agency Name: [facility]  Callback number: [phone]  Requesting OT  Frequency: 2x for 3 weeks/ 1x for 4 weeks  Please forward to [facility] pool or providers CMA

## 2022-12-26 DIAGNOSIS — M0680 Other specified rheumatoid arthritis, unspecified site: Secondary | ICD-10-CM | POA: Diagnosis not present

## 2022-12-26 DIAGNOSIS — E1122 Type 2 diabetes mellitus with diabetic chronic kidney disease: Secondary | ICD-10-CM | POA: Diagnosis not present

## 2022-12-26 DIAGNOSIS — N1832 Chronic kidney disease, stage 3b: Secondary | ICD-10-CM | POA: Diagnosis not present

## 2022-12-26 DIAGNOSIS — I130 Hypertensive heart and chronic kidney disease with heart failure and stage 1 through stage 4 chronic kidney disease, or unspecified chronic kidney disease: Secondary | ICD-10-CM | POA: Diagnosis not present

## 2022-12-26 DIAGNOSIS — I5032 Chronic diastolic (congestive) heart failure: Secondary | ICD-10-CM | POA: Diagnosis not present

## 2022-12-26 DIAGNOSIS — S43012D Anterior subluxation of left humerus, subsequent encounter: Secondary | ICD-10-CM | POA: Diagnosis not present

## 2022-12-27 DIAGNOSIS — I130 Hypertensive heart and chronic kidney disease with heart failure and stage 1 through stage 4 chronic kidney disease, or unspecified chronic kidney disease: Secondary | ICD-10-CM | POA: Diagnosis not present

## 2022-12-27 DIAGNOSIS — M0680 Other specified rheumatoid arthritis, unspecified site: Secondary | ICD-10-CM | POA: Diagnosis not present

## 2022-12-27 DIAGNOSIS — E1122 Type 2 diabetes mellitus with diabetic chronic kidney disease: Secondary | ICD-10-CM | POA: Diagnosis not present

## 2022-12-27 DIAGNOSIS — I5032 Chronic diastolic (congestive) heart failure: Secondary | ICD-10-CM | POA: Diagnosis not present

## 2022-12-27 DIAGNOSIS — S43012D Anterior subluxation of left humerus, subsequent encounter: Secondary | ICD-10-CM | POA: Diagnosis not present

## 2022-12-27 DIAGNOSIS — N1832 Chronic kidney disease, stage 3b: Secondary | ICD-10-CM | POA: Diagnosis not present

## 2022-12-28 DIAGNOSIS — I5032 Chronic diastolic (congestive) heart failure: Secondary | ICD-10-CM | POA: Diagnosis not present

## 2022-12-28 DIAGNOSIS — I130 Hypertensive heart and chronic kidney disease with heart failure and stage 1 through stage 4 chronic kidney disease, or unspecified chronic kidney disease: Secondary | ICD-10-CM | POA: Diagnosis not present

## 2022-12-28 DIAGNOSIS — N1832 Chronic kidney disease, stage 3b: Secondary | ICD-10-CM | POA: Diagnosis not present

## 2022-12-28 DIAGNOSIS — E1122 Type 2 diabetes mellitus with diabetic chronic kidney disease: Secondary | ICD-10-CM | POA: Diagnosis not present

## 2022-12-28 DIAGNOSIS — M0680 Other specified rheumatoid arthritis, unspecified site: Secondary | ICD-10-CM | POA: Diagnosis not present

## 2022-12-28 DIAGNOSIS — S43012D Anterior subluxation of left humerus, subsequent encounter: Secondary | ICD-10-CM | POA: Diagnosis not present

## 2022-12-31 ENCOUNTER — Other Ambulatory Visit: Payer: Self-pay | Admitting: Internal Medicine

## 2023-01-02 DIAGNOSIS — S43012D Anterior subluxation of left humerus, subsequent encounter: Secondary | ICD-10-CM | POA: Diagnosis not present

## 2023-01-02 DIAGNOSIS — I5032 Chronic diastolic (congestive) heart failure: Secondary | ICD-10-CM | POA: Diagnosis not present

## 2023-01-02 DIAGNOSIS — E1122 Type 2 diabetes mellitus with diabetic chronic kidney disease: Secondary | ICD-10-CM | POA: Diagnosis not present

## 2023-01-02 DIAGNOSIS — M0680 Other specified rheumatoid arthritis, unspecified site: Secondary | ICD-10-CM | POA: Diagnosis not present

## 2023-01-02 DIAGNOSIS — I130 Hypertensive heart and chronic kidney disease with heart failure and stage 1 through stage 4 chronic kidney disease, or unspecified chronic kidney disease: Secondary | ICD-10-CM | POA: Diagnosis not present

## 2023-01-02 DIAGNOSIS — N1832 Chronic kidney disease, stage 3b: Secondary | ICD-10-CM | POA: Diagnosis not present

## 2023-01-04 DIAGNOSIS — M069 Rheumatoid arthritis, unspecified: Secondary | ICD-10-CM | POA: Diagnosis not present

## 2023-01-04 DIAGNOSIS — I5032 Chronic diastolic (congestive) heart failure: Secondary | ICD-10-CM | POA: Diagnosis not present

## 2023-01-04 DIAGNOSIS — S43012D Anterior subluxation of left humerus, subsequent encounter: Secondary | ICD-10-CM | POA: Diagnosis not present

## 2023-01-04 DIAGNOSIS — Z79899 Other long term (current) drug therapy: Secondary | ICD-10-CM | POA: Diagnosis not present

## 2023-01-04 DIAGNOSIS — I130 Hypertensive heart and chronic kidney disease with heart failure and stage 1 through stage 4 chronic kidney disease, or unspecified chronic kidney disease: Secondary | ICD-10-CM | POA: Diagnosis not present

## 2023-01-04 DIAGNOSIS — N1832 Chronic kidney disease, stage 3b: Secondary | ICD-10-CM | POA: Diagnosis not present

## 2023-01-04 DIAGNOSIS — M0579 Rheumatoid arthritis with rheumatoid factor of multiple sites without organ or systems involvement: Secondary | ICD-10-CM | POA: Diagnosis not present

## 2023-01-04 DIAGNOSIS — E1122 Type 2 diabetes mellitus with diabetic chronic kidney disease: Secondary | ICD-10-CM | POA: Diagnosis not present

## 2023-01-04 DIAGNOSIS — M150 Primary generalized (osteo)arthritis: Secondary | ICD-10-CM | POA: Diagnosis not present

## 2023-01-04 DIAGNOSIS — M0680 Other specified rheumatoid arthritis, unspecified site: Secondary | ICD-10-CM | POA: Diagnosis not present

## 2023-01-05 DIAGNOSIS — I5032 Chronic diastolic (congestive) heart failure: Secondary | ICD-10-CM | POA: Diagnosis not present

## 2023-01-05 DIAGNOSIS — S43012D Anterior subluxation of left humerus, subsequent encounter: Secondary | ICD-10-CM | POA: Diagnosis not present

## 2023-01-05 DIAGNOSIS — I130 Hypertensive heart and chronic kidney disease with heart failure and stage 1 through stage 4 chronic kidney disease, or unspecified chronic kidney disease: Secondary | ICD-10-CM | POA: Diagnosis not present

## 2023-01-05 DIAGNOSIS — N1832 Chronic kidney disease, stage 3b: Secondary | ICD-10-CM | POA: Diagnosis not present

## 2023-01-05 DIAGNOSIS — E1122 Type 2 diabetes mellitus with diabetic chronic kidney disease: Secondary | ICD-10-CM | POA: Diagnosis not present

## 2023-01-05 DIAGNOSIS — M0680 Other specified rheumatoid arthritis, unspecified site: Secondary | ICD-10-CM | POA: Diagnosis not present

## 2023-01-08 DIAGNOSIS — M0680 Other specified rheumatoid arthritis, unspecified site: Secondary | ICD-10-CM | POA: Diagnosis not present

## 2023-01-08 DIAGNOSIS — E1122 Type 2 diabetes mellitus with diabetic chronic kidney disease: Secondary | ICD-10-CM | POA: Diagnosis not present

## 2023-01-08 DIAGNOSIS — N1832 Chronic kidney disease, stage 3b: Secondary | ICD-10-CM | POA: Diagnosis not present

## 2023-01-08 DIAGNOSIS — I130 Hypertensive heart and chronic kidney disease with heart failure and stage 1 through stage 4 chronic kidney disease, or unspecified chronic kidney disease: Secondary | ICD-10-CM | POA: Diagnosis not present

## 2023-01-08 DIAGNOSIS — I5032 Chronic diastolic (congestive) heart failure: Secondary | ICD-10-CM | POA: Diagnosis not present

## 2023-01-08 DIAGNOSIS — S43012D Anterior subluxation of left humerus, subsequent encounter: Secondary | ICD-10-CM | POA: Diagnosis not present

## 2023-01-09 ENCOUNTER — Encounter: Payer: Self-pay | Admitting: Internal Medicine

## 2023-01-09 ENCOUNTER — Ambulatory Visit (INDEPENDENT_AMBULATORY_CARE_PROVIDER_SITE_OTHER): Payer: Medicare Other | Admitting: Internal Medicine

## 2023-01-09 VITALS — BP 116/76 | HR 50 | Temp 98.1°F | Ht 63.0 in | Wt 222.0 lb

## 2023-01-09 DIAGNOSIS — R2242 Localized swelling, mass and lump, left lower limb: Secondary | ICD-10-CM | POA: Insufficient documentation

## 2023-01-09 DIAGNOSIS — I5032 Chronic diastolic (congestive) heart failure: Secondary | ICD-10-CM

## 2023-01-09 DIAGNOSIS — N1832 Chronic kidney disease, stage 3b: Secondary | ICD-10-CM | POA: Diagnosis not present

## 2023-01-09 DIAGNOSIS — G894 Chronic pain syndrome: Secondary | ICD-10-CM

## 2023-01-09 DIAGNOSIS — F1120 Opioid dependence, uncomplicated: Secondary | ICD-10-CM

## 2023-01-09 DIAGNOSIS — M0680 Other specified rheumatoid arthritis, unspecified site: Secondary | ICD-10-CM | POA: Diagnosis not present

## 2023-01-09 DIAGNOSIS — E1122 Type 2 diabetes mellitus with diabetic chronic kidney disease: Secondary | ICD-10-CM | POA: Diagnosis not present

## 2023-01-09 DIAGNOSIS — I130 Hypertensive heart and chronic kidney disease with heart failure and stage 1 through stage 4 chronic kidney disease, or unspecified chronic kidney disease: Secondary | ICD-10-CM | POA: Diagnosis not present

## 2023-01-09 DIAGNOSIS — S43012D Anterior subluxation of left humerus, subsequent encounter: Secondary | ICD-10-CM | POA: Diagnosis not present

## 2023-01-09 MED ORDER — POTASSIUM CHLORIDE CRYS ER 20 MEQ PO TBCR: 20.0000 meq | EXTENDED_RELEASE_TABLET | Freq: Every day | ORAL | 3 refills | Status: AC

## 2023-01-09 MED ORDER — OXYCODONE HCL 10 MG PO TABS: 5.0000 mg | ORAL_TABLET | Freq: Three times a day (TID) | ORAL | 0 refills | Status: DC | PRN

## 2023-01-09 NOTE — Addendum Note (Signed)
Addended by: Tillman Abide I on: 01/09/2023 01:12 PM   Modules accepted: Orders

## 2023-01-09 NOTE — Assessment & Plan Note (Signed)
Seems mostly like a cyst---not vascular and not fixed anywhere Asked her to try heat on this

## 2023-01-09 NOTE — Assessment & Plan Note (Signed)
PDMP reviewed No concerns 

## 2023-01-09 NOTE — Assessment & Plan Note (Addendum)
Is on torsemide Will check potassium and renal function again--to decide if she needs potassium supplement Hasn't needed the [employer]

## 2023-01-09 NOTE — Assessment & Plan Note (Signed)
Will recheck

## 2023-01-09 NOTE — Progress Notes (Addendum)
Subjective:    Patient ID: Tina Pacheco, female    DOB: [birth year], 72 y.o.   MRN: [id number]  HPI Here for follow up of chronic pain--and to check a lump on her leg  Did have left reverse total shoulder in early August Went to [facility] then --just got home 3 weeks ago Also had to have revision on right side due to displace acromial fracture--that was in April  Daughter living with her Still moves around in wheelchair---still needs rehab for arms (and can't walk) Mostly in chair--can transfer to commode Daughter helps with dressing and has aide to help with shower  Husband is here and son in law there (works from home)--when daughter at work  Presenter, 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  Phineas Semen tried to change her to hydrocodone--didn't work Hospital doctor back to the oxycodone--has 10mg  that she takes 1/2 at a time Some depression ---so little she can do/pain all over Does like animal videos---cat gives her company  Has lump in left thigh Noticed it almost 2 months ago Some tenderness  Last GFR 38 at hospital--up to 48 last week  Current Outpatient Medications on File Prior to Visit  Medication Sig Dispense Refill   amiodarone (PACERONE) 200 MG tablet TAKE 1 TABLET(200 MG) BY MOUTH DAILY 90 tablet 0   apixaban (ELIQUIS) 5 MG TABS tablet Take 1 tablet (5 mg total) by mouth 2 (two) times daily. 180 tablet 1   atorvastatin (LIPITOR) 40 MG tablet TAKE 1 TABLET(40 MG) BY MOUTH DAILY 90 tablet 3   Certolizumab Pegol (CIMZIA Clint) Inject into the skin. Patient states taking 1 x per month     ketoconazole (NIZORAL) 2 % cream Apply 1 Application topically as needed.     leflunomide (ARAVA) 20 MG tablet Take 20 mg by mouth daily.     metolazone (ZAROXOLYN) 2.5 MG tablet TAKE 1 TABLET BY MOUTH DAILY AS NEEDED. MAY GIVE 1 TABLET AS NEEDED FOR WEIGHT GAIN OF 5 LB IN 1 WEEK 30 tablet 5   metoprolol succinate (TOPROL-XL) 50 MG 24 hr tablet TAKE 1 TABLET BY MOUTH EVERY DAY WITH OR IMMEDIATELY  FOLLOWING A MEAL (Patient taking differently: Take 50 mg by mouth at bedtime. TAKE 1 TABLET BY MOUTH EVERY DAY WITH OR IMMEDIATELY FOLLOWING A MEAL) 90 tablet 3   montelukast (SINGULAIR) 10 MG tablet TAKE 1 TABLET BY MOUTH AT BEDTIME 90 tablet 3   nitroGLYCERIN (NITROSTAT) 0.4 MG SL tablet Place 1 tablet (0.4 mg total) under the tongue every 5 (five) minutes as needed for chest pain. 20 tablet 12   nystatin (MYCOSTATIN/NYSTOP) powder Apply 1 application topically 3 (three) times daily as needed. 60 g 0   omeprazole (PRILOSEC) 40 MG capsule Take 1 capsule (40 mg total) by mouth at bedtime. 90 capsule 3   Oxycodone HCl 10 MG TABS Take 1 tablet (10 mg total) by mouth 2 (two) times daily. 60 tablet 0   polyethylene glycol (MIRALAX / GLYCOLAX) packet Take 17 g by mouth daily as needed for mild constipation. 14 each 0   predniSONE (DELTASONE) 10 MG tablet Take 10 mg by mouth. Take 4 tablets (40 mg total) by mouth once daily for 3 days, THEN 3 tablets (30 mg total) once daily for 3 days, THEN 2 tablets (20 mg total) once daily for 3 days, THEN 1 tablet (10 mg total) once daily for 3 days.     senna-docusate (SENOKOT-S) 8.6-50 MG tablet Take 2 tablets by mouth at bedtime.  spironolactone (ALDACTONE) 25 MG tablet TAKE 1 TABLET(25 MG) BY MOUTH DAILY 90 tablet 3   terazosin (HYTRIN) 10 MG capsule TAKE 1 CAPSULE(10 MG) BY MOUTH AT BEDTIME 90 capsule 3   torsemide (DEMADEX) 20 MG tablet Take 1 tablet (20 mg total) by mouth daily. (Patient taking differently: Take 40 mg by mouth daily.) 1 tablet 0   predniSONE (DELTASONE) 5 MG tablet Take 1 tablet (5 mg total) by mouth daily with breakfast. (Patient not taking: Reported on 01/09/2023) 30 tablet 11   No current facility-administered medications on file prior to visit.    Allergies  Allergen Reactions   Fluoxetine Other (See Comments)    Headache, shaking, sleep issues Headache, shaking, sleep issues   Codeine     Nausea and vomiting/only when taking too  much    Past Medical History:  Diagnosis Date   (HFpEF) heart failure with preserved ejection fraction (HCC)    a.) TTE 08/10/2014: EF 55-60%, mild MAC, mild LAE, mild MR, PASP 38; b.) TTE 03/31/2019: EF 55-60%, mild LVH, mild-mod LAW, mod MAC, mild-mod AoV sclerosis, triv PR, G1DD; c.) TTE 05/29/2020: EF 60-65%, mild LVH, mod LAE, G1DD   Anemia    Aortic atherosclerosis (HCC)    Asthma    Basal cell carcinoma    CAD (coronary artery disease)    a.) LHC 05/24/2010 - nonobstructive CAD - med mgmt; b.) LHC 06/04/2020: 30% dLAD, 40% o-mLAD - med mgmt   Chronic, continuous use of opioids    Claustrophobia    COPD (chronic obstructive pulmonary disease) (HCC)    DDD (degenerative disc disease), lumbosacral    Diuretic-induced hypokalemia    Dyslipidemia    Fibromyalgia    Frequent falls    GERD (gastroesophageal reflux disease)    Gouty arthropathy    Hemihypertrophy    History of bilateral cataract extraction 2018   Hyperplastic colonic polyp 2003   Hypertension    Long term current use of amiodarone    Long term current use of anticoagulant    a.) apixaban   Long term current use of immunosuppressive drug    a.) certolizumab pegol  + predisone for RA diagnosis   MDD (major depressive disorder)    Migraines    Morbid obesity (HCC)    Nontraumatic complete tear of rotator cuff, left    OSA (obstructive sleep apnea)    a.) non-complaint with prescribed nocturnal PAP therapy   Osteoarthritis    PAF (paroxysmal atrial fibrillation) (HCC)    a.) CHA2DS2-VASc = 5 (age, sex, CHF, HTN, vascular disease history); b.) rate and rhythm maintained on oral amiodarone + metoprolol succinate; chronically anticoagulated with standard dose apixaban   Pneumonia    PONV (postoperative nausea and vomiting)    Prediabetes    Rheumatoid arthritis (HCC)    a.) on certolizumab pegol + predisone   Stage 3 chronic kidney disease (HCC)     Past Surgical History:  Procedure Laterality Date    ABDOMINAL HYSTERECTOMY     APPLICATION OF WOUND VAC Right 08/09/2017   Procedure: APPLICATION OF WOUND VAC;  Surgeon: Recardo Evangelist, DPM;  Location: [facility];  Service: Podiatry;  Laterality: Right;   CARDIAC CATHETERIZATION  05/24/2010   nonobstructive CAD   CATARACT EXTRACTION W/ INTRAOCULAR LENS IMPLANT Right 06/12/2016   Dr. Mia Creek   CATARACT EXTRACTION W/ INTRAOCULAR LENS IMPLANT Left 06/26/2016   Dr. Mia Creek   CESAREAN SECTION     CHOLECYSTECTOMY     COLONOSCOPY  06/12/2011  Procedure: COLONOSCOPY;  Surgeon: Charna Elizabeth, MD;  Location: [facility];  Service: Endoscopy;  Laterality: N/A;   COLONOSCOPY N/A 03/17/2013   Procedure: COLONOSCOPY;  Surgeon: Charna Elizabeth, MD;  Location: [facility];  Service: Endoscopy;  Laterality: N/A;   FOOT ARTHRODESIS Right 07/13/2017   Procedure: ARTHRODESIS FOOT-MULTI.FUSIONS (6 JOINTS);  Surgeon: Recardo Evangelist, DPM;  Location: [facility];  Service: Podiatry;  Laterality: Right;   IRRIGATION AND DEBRIDEMENT FOOT Right 08/09/2017   Procedure: IRRIGATION AND DEBRIDEMENT FOOT;  Surgeon: Recardo Evangelist, DPM;  Location: [facility];  Service: Podiatry;  Laterality: Right;   KNEE ARTHROSCOPY Bilateral    bilateral   LEFT HEART CATH AND CORONARY ANGIOGRAPHY N/A 06/04/2020   Procedure: LEFT HEART CATH AND CORONARY ANGIOGRAPHY;  Surgeon: Iran Ouch, MD;  Location: [facility];  Service: Cardiovascular;  Laterality: N/A;   REVERSE SHOULDER ARTHROPLASTY Right 11/16/2020   Procedure: REVERSE SHOULDER ARTHROPLASTY;  Surgeon: Christena Flake, MD;  Location: [facility];  Service: Orthopedics;  Laterality: Right;   REVERSE SHOULDER ARTHROPLASTY Left 10/03/2022   Procedure: REVERSE SHOULDER ARTHROPLASTY;  Surgeon: Christena Flake, MD;  Location: [facility];  Service: Orthopedics;  Laterality: Left;   SHOULDER CLOSED REDUCTION Right 11/11/2020   Procedure: CLOSED REDUCTION SHOULDER;  Surgeon: Christena Flake, MD;  Location: [facility];  Service:  Orthopedics;  Laterality: Right;   SHOULDER CLOSED REDUCTION Left 10/02/2022   Procedure: CLOSED REDUCTION SHOULDER;  Surgeon: Reinaldo Berber, MD;  Location: [facility];  Service: Orthopedics;  Laterality: Left;   SHOULDER INJECTION Right 11/11/2020   Procedure: SHOULDER INJECTION;  Surgeon: Christena Flake, MD;  Location: [facility];  Service: Orthopedics;  Laterality: Right;   SINUSOTOMY     TOOTH EXTRACTION  12/2016   TOTAL SHOULDER REVISION Right 06/27/2022   Procedure: TOTAL SHOULDER REVISION;  Surgeon: Christena Flake, MD;  Location: [facility];  Service: Orthopedics;  Laterality: Right;    Family History  Problem Relation Age of Onset   Tuberculosis Mother    Parkinsonism Mother    Emphysema Father        smoked   Arthritis Father    Cancer Sister    Diabetes type II Sister    Breast cancer Sister    Tuberculosis Sister    Breast cancer Maternal Aunt    Colon cancer Neg Hx    Esophageal cancer Neg Hx    Pancreatic cancer Neg Hx    Stomach cancer Neg Hx     Social History   Socioeconomic History   Marital status: Married    Spouse name: Rexford Maus   Number of children: 1   Years of education: Not on file   Highest education level: Bachelor's degree (e.g., BA, AB, BS)  Occupational History   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  Tobacco Use   Smoking status: Never    Passive exposure: Past   Smokeless tobacco: Never  Vaping Use   Vaping status: Never Used  Substance and Sexual Activity   Alcohol use: No   Drug use: No   Sexual activity: Not Currently  Other Topics Concern   Not on file  Social History Narrative   No living will   Daughter should be decision maker   Would accept resuscitation attempts--but no prolonged ventilator or tube feeds   Social Determinants of Health   Financial Resource Strain: Low Risk  (06/19/2022)   Overall Financial Resource Strain (CARDIA)    Difficulty  of Paying Living Expenses: Not hard at all  Food  Insecurity: No Food Insecurity (10/02/2022)   Hunger Vital Sign    Worried About Running Out of Food in the Last Year: Never true    Ran Out of Food in the Last Year: Never true  Transportation Needs: No Transportation Needs (10/02/2022)   PRAPARE - Administrator, Civil Service (Medical): No    Lack of Transportation (Non-Medical): No  Physical Activity: Unknown (06/19/2022)   Exercise Vital Sign    Days of Exercise per Week: 0 days    Minutes of Exercise per Session: Not on file  Stress: No Stress Concern Present (06/19/2022)   [employer] of Occupational Health - Occupational Stress Questionnaire    Feeling of Stress : Only a little  Social Connections: Moderately Isolated (06/19/2022)   Social Connection and Isolation Panel [NHANES]    Frequency of Communication with Friends and Family: More than three times a week    Frequency of Social Gatherings with Friends and Family: Three times a week    Attends Religious Services: Never    Active Member of Clubs or Organizations: No    Attends Banker Meetings: Not on file    Marital Status: Married  Catering manager Violence: Not At Risk (10/02/2022)   Humiliation, Afraid, Rape, and Kick questionnaire    Fear of Current or Ex-Partner: No    Emotionally Abused: No    Physically Abused: No    Sexually Abused: No   Review of Systems Yeast infection on backside--had diaper----healed now Not sleeping well--gets 4 hours then up (but does nap in the day) Having trouble taking current potassium--burns going down (not on list and is on spironolactone)    Objective:   Physical Exam Skin:    Comments: Has irregular well circumscribed mass subcutaneous in mild lateral left thigh. Mild tenderness but not warm and no bleeding/redness  Psychiatric:        Mood and Affect: Mood normal.        Behavior: Behavior normal.            Assessment & Plan:

## 2023-01-09 NOTE — Assessment & Plan Note (Signed)
Ongoing issues especially in back--but now both shoulders have been operated on and ongoing pain Will increase the oxycodone 10mg  to #90 per month (had actually been getting 40mg  daily prior to rehab)

## 2023-01-10 ENCOUNTER — Encounter: Payer: Self-pay | Admitting: Internal Medicine

## 2023-01-10 ENCOUNTER — Telehealth: Payer: Self-pay | Admitting: Internal Medicine

## 2023-01-10 DIAGNOSIS — I5032 Chronic diastolic (congestive) heart failure: Secondary | ICD-10-CM | POA: Diagnosis not present

## 2023-01-10 DIAGNOSIS — S43012D Anterior subluxation of left humerus, subsequent encounter: Secondary | ICD-10-CM | POA: Diagnosis not present

## 2023-01-10 DIAGNOSIS — E1122 Type 2 diabetes mellitus with diabetic chronic kidney disease: Secondary | ICD-10-CM | POA: Diagnosis not present

## 2023-01-10 DIAGNOSIS — N1832 Chronic kidney disease, stage 3b: Secondary | ICD-10-CM | POA: Diagnosis not present

## 2023-01-10 DIAGNOSIS — M0680 Other specified rheumatoid arthritis, unspecified site: Secondary | ICD-10-CM | POA: Diagnosis not present

## 2023-01-10 DIAGNOSIS — M0579 Rheumatoid arthritis with rheumatoid factor of multiple sites without organ or systems involvement: Secondary | ICD-10-CM | POA: Diagnosis not present

## 2023-01-10 DIAGNOSIS — I130 Hypertensive heart and chronic kidney disease with heart failure and stage 1 through stage 4 chronic kidney disease, or unspecified chronic kidney disease: Secondary | ICD-10-CM | POA: Diagnosis not present

## 2023-01-10 NOTE — Telephone Encounter (Signed)
Mirica from [facility] called Dr. Alphonsus Sias know that she visited the pt on yesterday, 11/12. Mirica states the pt's wounds have healed & pt's nursing frequency will now be once a week. Mirica mentioned the pt's nursing will be discharged in 2 weeks, due to her not having any other nursing needs. Call back # [phone], secured

## 2023-01-10 NOTE — Telephone Encounter (Signed)
Noted.  Glad she is improving.

## 2023-01-11 DIAGNOSIS — N1832 Chronic kidney disease, stage 3b: Secondary | ICD-10-CM | POA: Diagnosis not present

## 2023-01-11 DIAGNOSIS — M1990 Unspecified osteoarthritis, unspecified site: Secondary | ICD-10-CM | POA: Diagnosis not present

## 2023-01-11 DIAGNOSIS — G4733 Obstructive sleep apnea (adult) (pediatric): Secondary | ICD-10-CM | POA: Diagnosis not present

## 2023-01-11 DIAGNOSIS — S43012D Anterior subluxation of left humerus, subsequent encounter: Secondary | ICD-10-CM | POA: Diagnosis not present

## 2023-01-11 DIAGNOSIS — I2510 Atherosclerotic heart disease of native coronary artery without angina pectoris: Secondary | ICD-10-CM | POA: Diagnosis not present

## 2023-01-11 DIAGNOSIS — M353 Polymyalgia rheumatica: Secondary | ICD-10-CM | POA: Diagnosis not present

## 2023-01-11 DIAGNOSIS — G43909 Migraine, unspecified, not intractable, without status migrainosus: Secondary | ICD-10-CM | POA: Diagnosis not present

## 2023-01-11 DIAGNOSIS — I5032 Chronic diastolic (congestive) heart failure: Secondary | ICD-10-CM | POA: Diagnosis not present

## 2023-01-11 DIAGNOSIS — I480 Paroxysmal atrial fibrillation: Secondary | ICD-10-CM | POA: Diagnosis not present

## 2023-01-11 DIAGNOSIS — K219 Gastro-esophageal reflux disease without esophagitis: Secondary | ICD-10-CM | POA: Diagnosis not present

## 2023-01-11 DIAGNOSIS — M0680 Other specified rheumatoid arthritis, unspecified site: Secondary | ICD-10-CM | POA: Diagnosis not present

## 2023-01-11 DIAGNOSIS — E1122 Type 2 diabetes mellitus with diabetic chronic kidney disease: Secondary | ICD-10-CM | POA: Diagnosis not present

## 2023-01-11 DIAGNOSIS — I130 Hypertensive heart and chronic kidney disease with heart failure and stage 1 through stage 4 chronic kidney disease, or unspecified chronic kidney disease: Secondary | ICD-10-CM | POA: Diagnosis not present

## 2023-01-11 DIAGNOSIS — L89322 Pressure ulcer of left buttock, stage 2: Secondary | ICD-10-CM | POA: Diagnosis not present

## 2023-01-11 DIAGNOSIS — E782 Mixed hyperlipidemia: Secondary | ICD-10-CM | POA: Diagnosis not present

## 2023-01-11 DIAGNOSIS — M797 Fibromyalgia: Secondary | ICD-10-CM | POA: Diagnosis not present

## 2023-01-11 DIAGNOSIS — L89152 Pressure ulcer of sacral region, stage 2: Secondary | ICD-10-CM | POA: Diagnosis not present

## 2023-01-11 DIAGNOSIS — L89312 Pressure ulcer of right buttock, stage 2: Secondary | ICD-10-CM | POA: Diagnosis not present

## 2023-01-11 DIAGNOSIS — K5900 Constipation, unspecified: Secondary | ICD-10-CM | POA: Diagnosis not present

## 2023-01-11 DIAGNOSIS — D631 Anemia in chronic kidney disease: Secondary | ICD-10-CM | POA: Diagnosis not present

## 2023-01-11 DIAGNOSIS — M51379 Other intervertebral disc degeneration, lumbosacral region without mention of lumbar back pain or lower extremity pain: Secondary | ICD-10-CM | POA: Diagnosis not present

## 2023-01-11 DIAGNOSIS — G2581 Restless legs syndrome: Secondary | ICD-10-CM | POA: Diagnosis not present

## 2023-01-11 DIAGNOSIS — J4489 Other specified chronic obstructive pulmonary disease: Secondary | ICD-10-CM | POA: Diagnosis not present

## 2023-01-11 DIAGNOSIS — G4700 Insomnia, unspecified: Secondary | ICD-10-CM | POA: Diagnosis not present

## 2023-01-11 DIAGNOSIS — G894 Chronic pain syndrome: Secondary | ICD-10-CM | POA: Diagnosis not present

## 2023-01-12 ENCOUNTER — Encounter: Payer: Self-pay | Admitting: Cardiovascular Disease

## 2023-01-15 DIAGNOSIS — S43012D Anterior subluxation of left humerus, subsequent encounter: Secondary | ICD-10-CM | POA: Diagnosis not present

## 2023-01-15 DIAGNOSIS — M0680 Other specified rheumatoid arthritis, unspecified site: Secondary | ICD-10-CM | POA: Diagnosis not present

## 2023-01-15 DIAGNOSIS — I130 Hypertensive heart and chronic kidney disease with heart failure and stage 1 through stage 4 chronic kidney disease, or unspecified chronic kidney disease: Secondary | ICD-10-CM | POA: Diagnosis not present

## 2023-01-15 DIAGNOSIS — I5032 Chronic diastolic (congestive) heart failure: Secondary | ICD-10-CM | POA: Diagnosis not present

## 2023-01-15 DIAGNOSIS — N1832 Chronic kidney disease, stage 3b: Secondary | ICD-10-CM | POA: Diagnosis not present

## 2023-01-15 DIAGNOSIS — E1122 Type 2 diabetes mellitus with diabetic chronic kidney disease: Secondary | ICD-10-CM | POA: Diagnosis not present

## 2023-01-16 DIAGNOSIS — I5032 Chronic diastolic (congestive) heart failure: Secondary | ICD-10-CM | POA: Diagnosis not present

## 2023-01-16 DIAGNOSIS — N1832 Chronic kidney disease, stage 3b: Secondary | ICD-10-CM | POA: Diagnosis not present

## 2023-01-16 DIAGNOSIS — I130 Hypertensive heart and chronic kidney disease with heart failure and stage 1 through stage 4 chronic kidney disease, or unspecified chronic kidney disease: Secondary | ICD-10-CM | POA: Diagnosis not present

## 2023-01-16 DIAGNOSIS — S43012D Anterior subluxation of left humerus, subsequent encounter: Secondary | ICD-10-CM | POA: Diagnosis not present

## 2023-01-16 DIAGNOSIS — E1122 Type 2 diabetes mellitus with diabetic chronic kidney disease: Secondary | ICD-10-CM | POA: Diagnosis not present

## 2023-01-16 DIAGNOSIS — M0680 Other specified rheumatoid arthritis, unspecified site: Secondary | ICD-10-CM | POA: Diagnosis not present

## 2023-01-17 ENCOUNTER — Encounter: Payer: Self-pay | Admitting: Internal Medicine

## 2023-01-17 DIAGNOSIS — E1122 Type 2 diabetes mellitus with diabetic chronic kidney disease: Secondary | ICD-10-CM | POA: Diagnosis not present

## 2023-01-17 DIAGNOSIS — S43012D Anterior subluxation of left humerus, subsequent encounter: Secondary | ICD-10-CM | POA: Diagnosis not present

## 2023-01-17 DIAGNOSIS — M0680 Other specified rheumatoid arthritis, unspecified site: Secondary | ICD-10-CM | POA: Diagnosis not present

## 2023-01-17 DIAGNOSIS — M353 Polymyalgia rheumatica: Secondary | ICD-10-CM | POA: Diagnosis not present

## 2023-01-17 DIAGNOSIS — I2510 Atherosclerotic heart disease of native coronary artery without angina pectoris: Secondary | ICD-10-CM | POA: Diagnosis not present

## 2023-01-17 DIAGNOSIS — I480 Paroxysmal atrial fibrillation: Secondary | ICD-10-CM | POA: Diagnosis not present

## 2023-01-17 DIAGNOSIS — I130 Hypertensive heart and chronic kidney disease with heart failure and stage 1 through stage 4 chronic kidney disease, or unspecified chronic kidney disease: Secondary | ICD-10-CM | POA: Diagnosis not present

## 2023-01-17 DIAGNOSIS — G2581 Restless legs syndrome: Secondary | ICD-10-CM | POA: Diagnosis not present

## 2023-01-17 DIAGNOSIS — N1832 Chronic kidney disease, stage 3b: Secondary | ICD-10-CM | POA: Diagnosis not present

## 2023-01-17 DIAGNOSIS — D631 Anemia in chronic kidney disease: Secondary | ICD-10-CM | POA: Diagnosis not present

## 2023-01-17 DIAGNOSIS — J4489 Other specified chronic obstructive pulmonary disease: Secondary | ICD-10-CM | POA: Diagnosis not present

## 2023-01-17 DIAGNOSIS — I5032 Chronic diastolic (congestive) heart failure: Secondary | ICD-10-CM | POA: Diagnosis not present

## 2023-01-17 NOTE — Telephone Encounter (Signed)
Forms printed and given to Dr Alphonsus Sias in his inbox on his desk.

## 2023-01-18 DIAGNOSIS — E1122 Type 2 diabetes mellitus with diabetic chronic kidney disease: Secondary | ICD-10-CM | POA: Diagnosis not present

## 2023-01-18 DIAGNOSIS — M0680 Other specified rheumatoid arthritis, unspecified site: Secondary | ICD-10-CM | POA: Diagnosis not present

## 2023-01-18 DIAGNOSIS — I5032 Chronic diastolic (congestive) heart failure: Secondary | ICD-10-CM | POA: Diagnosis not present

## 2023-01-18 DIAGNOSIS — S43012D Anterior subluxation of left humerus, subsequent encounter: Secondary | ICD-10-CM | POA: Diagnosis not present

## 2023-01-18 DIAGNOSIS — I130 Hypertensive heart and chronic kidney disease with heart failure and stage 1 through stage 4 chronic kidney disease, or unspecified chronic kidney disease: Secondary | ICD-10-CM | POA: Diagnosis not present

## 2023-01-18 DIAGNOSIS — N1832 Chronic kidney disease, stage 3b: Secondary | ICD-10-CM | POA: Diagnosis not present

## 2023-01-19 ENCOUNTER — Encounter: Payer: Medicare Other | Admitting: Internal Medicine

## 2023-01-22 NOTE — Telephone Encounter (Signed)
Called patient and notified her of the following from Dr. Mariah Milling.  Unclear what is going on, Unable to exclude a bladder issue or prolapse that could be contributing to urgency or trouble voiding.  Not a typical symptom of torsemide Need to make sure she is taking her potassium and spironolactone, last potassium was low  3.5 Renal function has improved off metolazone Certainly metolazone could be used for worsening leg swelling but not 5 days in a row as she did over the summer.  Would take metolazone sparingly, probably no more than twice a week Need to take extra potassium 20 for every metolazone Thx TGollan       Patient verbalizes understanding. Patient states that she is taking Spironolactone and potassium.

## 2023-01-23 DIAGNOSIS — I130 Hypertensive heart and chronic kidney disease with heart failure and stage 1 through stage 4 chronic kidney disease, or unspecified chronic kidney disease: Secondary | ICD-10-CM | POA: Diagnosis not present

## 2023-01-23 DIAGNOSIS — N1832 Chronic kidney disease, stage 3b: Secondary | ICD-10-CM | POA: Diagnosis not present

## 2023-01-23 DIAGNOSIS — M0680 Other specified rheumatoid arthritis, unspecified site: Secondary | ICD-10-CM | POA: Diagnosis not present

## 2023-01-23 DIAGNOSIS — E1122 Type 2 diabetes mellitus with diabetic chronic kidney disease: Secondary | ICD-10-CM | POA: Diagnosis not present

## 2023-01-23 DIAGNOSIS — S43012D Anterior subluxation of left humerus, subsequent encounter: Secondary | ICD-10-CM | POA: Diagnosis not present

## 2023-01-23 DIAGNOSIS — I5032 Chronic diastolic (congestive) heart failure: Secondary | ICD-10-CM | POA: Diagnosis not present

## 2023-01-24 DIAGNOSIS — Z96611 Presence of right artificial shoulder joint: Secondary | ICD-10-CM | POA: Diagnosis not present

## 2023-01-24 DIAGNOSIS — Z96612 Presence of left artificial shoulder joint: Secondary | ICD-10-CM | POA: Diagnosis not present

## 2023-01-24 DIAGNOSIS — S42121G Displaced fracture of acromial process, right shoulder, subsequent encounter for fracture with delayed healing: Secondary | ICD-10-CM | POA: Diagnosis not present

## 2023-01-24 DIAGNOSIS — G8929 Other chronic pain: Secondary | ICD-10-CM | POA: Diagnosis not present

## 2023-01-24 DIAGNOSIS — S43012D Anterior subluxation of left humerus, subsequent encounter: Secondary | ICD-10-CM | POA: Diagnosis not present

## 2023-01-24 DIAGNOSIS — S43015S Anterior dislocation of left humerus, sequela: Secondary | ICD-10-CM | POA: Diagnosis not present

## 2023-01-24 DIAGNOSIS — T84028S Dislocation of other internal joint prosthesis, sequela: Secondary | ICD-10-CM | POA: Diagnosis not present

## 2023-01-24 DIAGNOSIS — E1122 Type 2 diabetes mellitus with diabetic chronic kidney disease: Secondary | ICD-10-CM | POA: Diagnosis not present

## 2023-01-24 DIAGNOSIS — I5032 Chronic diastolic (congestive) heart failure: Secondary | ICD-10-CM | POA: Diagnosis not present

## 2023-01-24 DIAGNOSIS — M25511 Pain in right shoulder: Secondary | ICD-10-CM | POA: Diagnosis not present

## 2023-01-24 DIAGNOSIS — Z96619 Presence of unspecified artificial shoulder joint: Secondary | ICD-10-CM | POA: Diagnosis not present

## 2023-01-24 DIAGNOSIS — M0680 Other specified rheumatoid arthritis, unspecified site: Secondary | ICD-10-CM | POA: Diagnosis not present

## 2023-01-24 DIAGNOSIS — I130 Hypertensive heart and chronic kidney disease with heart failure and stage 1 through stage 4 chronic kidney disease, or unspecified chronic kidney disease: Secondary | ICD-10-CM | POA: Diagnosis not present

## 2023-01-24 DIAGNOSIS — N1832 Chronic kidney disease, stage 3b: Secondary | ICD-10-CM | POA: Diagnosis not present

## 2023-01-24 DIAGNOSIS — M25512 Pain in left shoulder: Secondary | ICD-10-CM | POA: Diagnosis not present

## 2023-01-30 ENCOUNTER — Other Ambulatory Visit: Payer: Self-pay

## 2023-01-30 DIAGNOSIS — I480 Paroxysmal atrial fibrillation: Secondary | ICD-10-CM

## 2023-01-30 MED ORDER — APIXABAN 5 MG PO TABS
5.0000 mg | ORAL_TABLET | Freq: Two times a day (BID) | ORAL | 1 refills | Status: AC
Start: 2023-01-30 — End: ?

## 2023-01-30 NOTE — Telephone Encounter (Signed)
Prescription refill request for Eliquis received. Indication:afib Last office visit:6/24 Scr:1.2  11/24 Age: 72 Weight:100.7  kg  Prescription refilled

## 2023-01-31 DIAGNOSIS — I5032 Chronic diastolic (congestive) heart failure: Secondary | ICD-10-CM | POA: Diagnosis not present

## 2023-01-31 DIAGNOSIS — M0680 Other specified rheumatoid arthritis, unspecified site: Secondary | ICD-10-CM | POA: Diagnosis not present

## 2023-01-31 DIAGNOSIS — S43012D Anterior subluxation of left humerus, subsequent encounter: Secondary | ICD-10-CM | POA: Diagnosis not present

## 2023-01-31 DIAGNOSIS — N1832 Chronic kidney disease, stage 3b: Secondary | ICD-10-CM | POA: Diagnosis not present

## 2023-01-31 DIAGNOSIS — E1122 Type 2 diabetes mellitus with diabetic chronic kidney disease: Secondary | ICD-10-CM | POA: Diagnosis not present

## 2023-01-31 DIAGNOSIS — I130 Hypertensive heart and chronic kidney disease with heart failure and stage 1 through stage 4 chronic kidney disease, or unspecified chronic kidney disease: Secondary | ICD-10-CM | POA: Diagnosis not present

## 2023-02-01 DIAGNOSIS — M0680 Other specified rheumatoid arthritis, unspecified site: Secondary | ICD-10-CM | POA: Diagnosis not present

## 2023-02-01 DIAGNOSIS — I5032 Chronic diastolic (congestive) heart failure: Secondary | ICD-10-CM | POA: Diagnosis not present

## 2023-02-01 DIAGNOSIS — N1832 Chronic kidney disease, stage 3b: Secondary | ICD-10-CM | POA: Diagnosis not present

## 2023-02-01 DIAGNOSIS — I130 Hypertensive heart and chronic kidney disease with heart failure and stage 1 through stage 4 chronic kidney disease, or unspecified chronic kidney disease: Secondary | ICD-10-CM | POA: Diagnosis not present

## 2023-02-01 DIAGNOSIS — S43012D Anterior subluxation of left humerus, subsequent encounter: Secondary | ICD-10-CM | POA: Diagnosis not present

## 2023-02-01 DIAGNOSIS — E1122 Type 2 diabetes mellitus with diabetic chronic kidney disease: Secondary | ICD-10-CM | POA: Diagnosis not present

## 2023-02-05 DIAGNOSIS — I130 Hypertensive heart and chronic kidney disease with heart failure and stage 1 through stage 4 chronic kidney disease, or unspecified chronic kidney disease: Secondary | ICD-10-CM | POA: Diagnosis not present

## 2023-02-05 DIAGNOSIS — N1832 Chronic kidney disease, stage 3b: Secondary | ICD-10-CM | POA: Diagnosis not present

## 2023-02-05 DIAGNOSIS — E1122 Type 2 diabetes mellitus with diabetic chronic kidney disease: Secondary | ICD-10-CM | POA: Diagnosis not present

## 2023-02-05 DIAGNOSIS — S43012D Anterior subluxation of left humerus, subsequent encounter: Secondary | ICD-10-CM | POA: Diagnosis not present

## 2023-02-05 DIAGNOSIS — I5032 Chronic diastolic (congestive) heart failure: Secondary | ICD-10-CM | POA: Diagnosis not present

## 2023-02-05 DIAGNOSIS — M0680 Other specified rheumatoid arthritis, unspecified site: Secondary | ICD-10-CM | POA: Diagnosis not present

## 2023-02-06 ENCOUNTER — Other Ambulatory Visit: Payer: Self-pay | Admitting: Internal Medicine

## 2023-02-06 MED ORDER — OXYCODONE HCL 10 MG PO TABS: 5.0000 mg | ORAL_TABLET | Freq: Three times a day (TID) | ORAL | 0 refills | Status: DC | PRN

## 2023-02-06 NOTE — Telephone Encounter (Signed)
Prescription Request  02/06/2023  LOV: 01/09/2023  What is the name of the medication or equipment? Oxycodone HCl 10 MG TABS   Have you contacted your pharmacy to request a refill? No   Which pharmacy would you like this sent to?  [id number] - [place], [place] - [address] AT [facility] OF [address] [facility] [address] [place] [address] Phone: [phone] Fax: [phone]    Patient notified that their request is being sent to the clinical staff for review and that they should receive a response within 2 business days.   Please advise at Mobile [phone] (mobile)

## 2023-02-06 NOTE — Telephone Encounter (Signed)
Name of Medication: Oxycodone 10 mg Name of Pharmacy: [facility] [address] or Written Date and Quantity: #90 Last Office Visit and Type: 01-09-23 Next Office Visit and Type: 04-17-23 Last Controlled Substance Agreement Date: 03-30-22 Last UDS: 03-30-22

## 2023-02-08 DIAGNOSIS — I5032 Chronic diastolic (congestive) heart failure: Secondary | ICD-10-CM | POA: Diagnosis not present

## 2023-02-08 DIAGNOSIS — M0680 Other specified rheumatoid arthritis, unspecified site: Secondary | ICD-10-CM | POA: Diagnosis not present

## 2023-02-08 DIAGNOSIS — I130 Hypertensive heart and chronic kidney disease with heart failure and stage 1 through stage 4 chronic kidney disease, or unspecified chronic kidney disease: Secondary | ICD-10-CM | POA: Diagnosis not present

## 2023-02-08 DIAGNOSIS — E1122 Type 2 diabetes mellitus with diabetic chronic kidney disease: Secondary | ICD-10-CM | POA: Diagnosis not present

## 2023-02-08 DIAGNOSIS — S43012D Anterior subluxation of left humerus, subsequent encounter: Secondary | ICD-10-CM | POA: Diagnosis not present

## 2023-02-08 DIAGNOSIS — N1832 Chronic kidney disease, stage 3b: Secondary | ICD-10-CM | POA: Diagnosis not present

## 2023-02-09 DIAGNOSIS — I5032 Chronic diastolic (congestive) heart failure: Secondary | ICD-10-CM | POA: Diagnosis not present

## 2023-02-09 DIAGNOSIS — S43012D Anterior subluxation of left humerus, subsequent encounter: Secondary | ICD-10-CM | POA: Diagnosis not present

## 2023-02-09 DIAGNOSIS — N1832 Chronic kidney disease, stage 3b: Secondary | ICD-10-CM | POA: Diagnosis not present

## 2023-02-09 DIAGNOSIS — I130 Hypertensive heart and chronic kidney disease with heart failure and stage 1 through stage 4 chronic kidney disease, or unspecified chronic kidney disease: Secondary | ICD-10-CM | POA: Diagnosis not present

## 2023-02-09 DIAGNOSIS — M0680 Other specified rheumatoid arthritis, unspecified site: Secondary | ICD-10-CM | POA: Diagnosis not present

## 2023-02-09 DIAGNOSIS — E1122 Type 2 diabetes mellitus with diabetic chronic kidney disease: Secondary | ICD-10-CM | POA: Diagnosis not present

## 2023-02-10 DIAGNOSIS — I130 Hypertensive heart and chronic kidney disease with heart failure and stage 1 through stage 4 chronic kidney disease, or unspecified chronic kidney disease: Secondary | ICD-10-CM | POA: Diagnosis not present

## 2023-02-10 DIAGNOSIS — J4489 Other specified chronic obstructive pulmonary disease: Secondary | ICD-10-CM | POA: Diagnosis not present

## 2023-02-10 DIAGNOSIS — G43909 Migraine, unspecified, not intractable, without status migrainosus: Secondary | ICD-10-CM | POA: Diagnosis not present

## 2023-02-10 DIAGNOSIS — K219 Gastro-esophageal reflux disease without esophagitis: Secondary | ICD-10-CM | POA: Diagnosis not present

## 2023-02-10 DIAGNOSIS — M0680 Other specified rheumatoid arthritis, unspecified site: Secondary | ICD-10-CM | POA: Diagnosis not present

## 2023-02-10 DIAGNOSIS — G4733 Obstructive sleep apnea (adult) (pediatric): Secondary | ICD-10-CM | POA: Diagnosis not present

## 2023-02-10 DIAGNOSIS — I480 Paroxysmal atrial fibrillation: Secondary | ICD-10-CM | POA: Diagnosis not present

## 2023-02-10 DIAGNOSIS — I5032 Chronic diastolic (congestive) heart failure: Secondary | ICD-10-CM | POA: Diagnosis not present

## 2023-02-10 DIAGNOSIS — E1122 Type 2 diabetes mellitus with diabetic chronic kidney disease: Secondary | ICD-10-CM | POA: Diagnosis not present

## 2023-02-10 DIAGNOSIS — D631 Anemia in chronic kidney disease: Secondary | ICD-10-CM | POA: Diagnosis not present

## 2023-02-10 DIAGNOSIS — M797 Fibromyalgia: Secondary | ICD-10-CM | POA: Diagnosis not present

## 2023-02-10 DIAGNOSIS — M1990 Unspecified osteoarthritis, unspecified site: Secondary | ICD-10-CM | POA: Diagnosis not present

## 2023-02-10 DIAGNOSIS — M353 Polymyalgia rheumatica: Secondary | ICD-10-CM | POA: Diagnosis not present

## 2023-02-10 DIAGNOSIS — K5900 Constipation, unspecified: Secondary | ICD-10-CM | POA: Diagnosis not present

## 2023-02-10 DIAGNOSIS — I2510 Atherosclerotic heart disease of native coronary artery without angina pectoris: Secondary | ICD-10-CM | POA: Diagnosis not present

## 2023-02-10 DIAGNOSIS — G4700 Insomnia, unspecified: Secondary | ICD-10-CM | POA: Diagnosis not present

## 2023-02-10 DIAGNOSIS — L89322 Pressure ulcer of left buttock, stage 2: Secondary | ICD-10-CM | POA: Diagnosis not present

## 2023-02-10 DIAGNOSIS — N1832 Chronic kidney disease, stage 3b: Secondary | ICD-10-CM | POA: Diagnosis not present

## 2023-02-10 DIAGNOSIS — S43012D Anterior subluxation of left humerus, subsequent encounter: Secondary | ICD-10-CM | POA: Diagnosis not present

## 2023-02-10 DIAGNOSIS — G2581 Restless legs syndrome: Secondary | ICD-10-CM | POA: Diagnosis not present

## 2023-02-10 DIAGNOSIS — E782 Mixed hyperlipidemia: Secondary | ICD-10-CM | POA: Diagnosis not present

## 2023-02-10 DIAGNOSIS — L89152 Pressure ulcer of sacral region, stage 2: Secondary | ICD-10-CM | POA: Diagnosis not present

## 2023-02-10 DIAGNOSIS — L89312 Pressure ulcer of right buttock, stage 2: Secondary | ICD-10-CM | POA: Diagnosis not present

## 2023-02-10 DIAGNOSIS — M51379 Other intervertebral disc degeneration, lumbosacral region without mention of lumbar back pain or lower extremity pain: Secondary | ICD-10-CM | POA: Diagnosis not present

## 2023-02-10 DIAGNOSIS — G894 Chronic pain syndrome: Secondary | ICD-10-CM | POA: Diagnosis not present

## 2023-02-13 DIAGNOSIS — M0579 Rheumatoid arthritis with rheumatoid factor of multiple sites without organ or systems involvement: Secondary | ICD-10-CM | POA: Diagnosis not present

## 2023-02-15 DIAGNOSIS — I130 Hypertensive heart and chronic kidney disease with heart failure and stage 1 through stage 4 chronic kidney disease, or unspecified chronic kidney disease: Secondary | ICD-10-CM | POA: Diagnosis not present

## 2023-02-15 DIAGNOSIS — I5032 Chronic diastolic (congestive) heart failure: Secondary | ICD-10-CM | POA: Diagnosis not present

## 2023-02-15 DIAGNOSIS — E1122 Type 2 diabetes mellitus with diabetic chronic kidney disease: Secondary | ICD-10-CM | POA: Diagnosis not present

## 2023-02-15 DIAGNOSIS — S43012D Anterior subluxation of left humerus, subsequent encounter: Secondary | ICD-10-CM | POA: Diagnosis not present

## 2023-02-15 DIAGNOSIS — M0680 Other specified rheumatoid arthritis, unspecified site: Secondary | ICD-10-CM | POA: Diagnosis not present

## 2023-02-15 DIAGNOSIS — N1832 Chronic kidney disease, stage 3b: Secondary | ICD-10-CM | POA: Diagnosis not present

## 2023-02-22 NOTE — Progress Notes (Deleted)
Cardiology Office Note    Date:  02/22/2023   ID:  Tina Pacheco, Tina Pacheco 03/18/1950, MRN [id number]  PCP:  Karie Schwalbe, MD  Cardiologist:  Julien Nordmann, MD  Electrophysiologist:  None   Chief Complaint: Follow up  History of Present Illness:   Tina Pacheco is a 72 y.o. female with history of nonobstructive CAD by LHC in 05/2020, HFpEF, PAF on Eliquis, CKD stage III, HTN, HLD, anemia, COPD, rheumatoid arthritis, fibromyalgia, falls, morbid obesity, H. pylori, and OSA who presents for follow-up of HFpEF and A-fib.   LHC in 2012 showed nonobstructive CAD.  She was diagnosed with A. fib in 2012.  Echo in 2016 showed an EF of 55 to 60%, no regional wall motion abnormalities, grade 2 diastolic dysfunction, and mild mitral regurgitation.  She was admitted in 02/2019 with weakness and multiple falls and acute on chronic diastolic CHF.  Echo showed an EF of 55 to 60%, mild LVH, grade 1 diastolic dysfunction, no regional wall motion abnormalities, mildly to moderately dilated left atrium, moderate mitral valve calcification without regurgitation or stenosis.  She was admitted in 12/2019 with volume overload and weight gain.  She was IV diuresed.  Echo showed an EF of 60 to 65%, no regional wall motion abnormalities, mild LVH, normal LV diastolic function parameters, normal RV systolic function and ventricular cavity size, trivial mitral regurgitation, and trivial aortic valve insufficiency.  She was admitted in 05/2020 with  acute hypoxic respiratory failure due to pulmonary edema in the setting of acute on chronic diastolic CHF and A. fib with RVR.  She converted to sinus rhythm with amiodarone.  Echo demonstrated an EF of 60 to 65%, no regional wall motion abnormalities, mild LVH, grade 1 diastolic dysfunction, normal RV systolic function and ventricular cavity size, and a moderately dilated left atrium.  Lexiscan MPI was abnormal with a large in size, moderate in severity, completely reversible  defect involving the mid anterior, anterolateral, and inferolateral segments as well as the apical anterior and lateral segments.  The defect was concerning for ischemia, though artifact could not be excluded.  LVEF was normal.  Attenuation corrected CT imaging showed coronary artery calcification, aortic atherosclerosis, and dense mitral annular calcification.  Overall, this was an intermediate restudy.  Given abnormal MPI, she underwent LHC on 06/04/2020 which showed no evidence of obstructive CAD with ostial to mid LAD 40% stenosis and distal LAD 30% stenosis.   She was seen in the office in 07/2020 with dramatic documented weight loss trending from 353 in 06/2020 trending to 301 in 07/2020.  Recommendation to continue torsemide 40 mg daily with an extra 40 mg as needed and hold metolazone unless weight was greater than 310 pounds.  Phone note after that visit showed to the patient was woken up on 08/03/2020 with chest pain and shortness of breath lasting approximately 45 minutes.  Patient indicated EMS came out with patient reporting EKG showed A-fib.  Note indicates she refused transport to the ED.   She was admitted to the hospital in 03/2021 with acute metabolic encephalopathy felt to likely be related to polypharmacy with oxycodone, gabapentin, and recent addition of Phenergan in the context of acute on CKD.  She had fallen numerous times prior to her admission.  Head CT was negative for acute process.  Ammonia normal.  High-sensitivity troponin normal x3.  BNP 133.  EKG showed sinus rhythm without acute ST-T changes.   She was admitted to the hospital in 08/2021 with weakness  and acute on CKD stage IIIb with severe hypokalemia with a potassium of 2.5.  She had been taking metolazone on a daily basis.  Admission serum creatinine was 2.37 with a baseline around 1.2.     She was seen in the office in 02/2022 with her weight noted to be down greater than 50 pounds compared to revisit in 06/2021, however she  continued to note fluctuations with intermittent rapid weight gain.  Caretaker indicated this was in the setting of increased sodium intake at times.  She was taking torsemide 40 mg daily with an additional 20 mg intermittently.  She was also intermittently taking metolazone for a weight greater than 280 pounds.  Lower extremity swelling was significantly improved.  She was maintaining sinus rhythm.  She was most recently seen in 07/2022 noting fluctuations in her weight earlier in the month.  She reported taking metolazone daily for 5 days with regularly scheduled to torsemide.  She was dealing with postoperative recovery from revision of right shoulder replacement complicated by delayed wound healing and infection requiring wound VAC.  She was without symptoms of angina, dyspnea, or cardiac decompensation.  Her weight was down 9 pounds when compared to revisit in 02/2022.  ***   Labs independently reviewed: 12/2022 - Hgb 10.6, PLT 322, potassium 3.5, BUN 25, serum creatinine 1.2, albumin 3.6, AST/ALT normal 09/2022 - magnesium 1.8 11/2021 - TSH normal, TC 149, TG 160, HDL 46, LDL 71   Past Medical History:  Diagnosis Date   (HFpEF) heart failure with preserved ejection fraction (HCC)    a.) TTE 08/10/2014: EF 55-60%, mild MAC, mild LAE, mild MR, PASP 38; b.) TTE 03/31/2019: EF 55-60%, mild LVH, mild-mod LAW, mod MAC, mild-mod AoV sclerosis, triv PR, G1DD; c.) TTE 05/29/2020: EF 60-65%, mild LVH, mod LAE, G1DD   Anemia    Aortic atherosclerosis (HCC)    Asthma    Basal cell carcinoma    CAD (coronary artery disease)    a.) LHC 05/24/2010 - nonobstructive CAD - med mgmt; b.) LHC 06/04/2020: 30% dLAD, 40% o-mLAD - med mgmt   Chronic, continuous use of opioids    Claustrophobia    COPD (chronic obstructive pulmonary disease) (HCC)    DDD (degenerative disc disease), lumbosacral    Diuretic-induced hypokalemia    Dyslipidemia    Fibromyalgia    Frequent falls    GERD (gastroesophageal reflux  disease)    Gouty arthropathy    Hemihypertrophy    History of bilateral cataract extraction 2018   Hyperplastic colonic polyp 2003   Hypertension    Long term current use of amiodarone    Long term current use of anticoagulant    a.) apixaban   Long term current use of immunosuppressive drug    a.) certolizumab pegol  + predisone for RA diagnosis   MDD (major depressive disorder)    Migraines    Morbid obesity (HCC)    Nontraumatic complete tear of rotator cuff, left    OSA (obstructive sleep apnea)    a.) non-complaint with prescribed nocturnal PAP therapy   Osteoarthritis    PAF (paroxysmal atrial fibrillation) (HCC)    a.) CHA2DS2-VASc = 5 (age, sex, CHF, HTN, vascular disease history); b.) rate and rhythm maintained on oral amiodarone + metoprolol succinate; chronically anticoagulated with standard dose apixaban   Pneumonia    PONV (postoperative nausea and vomiting)    Prediabetes    Rheumatoid arthritis (HCC)    a.) on certolizumab pegol + predisone   Stage  3 chronic kidney disease [facility])     Past Surgical History:  Procedure Laterality Date   ABDOMINAL HYSTERECTOMY     APPLICATION OF WOUND VAC Right 08/09/2017   Procedure: APPLICATION OF WOUND VAC;  Surgeon: Recardo Evangelist, DPM;  Location: [facility];  Service: Podiatry;  Laterality: Right;   CARDIAC CATHETERIZATION  05/24/2010   nonobstructive CAD   CATARACT EXTRACTION W/ INTRAOCULAR LENS IMPLANT Right 06/12/2016   Dr. Mia Creek   CATARACT EXTRACTION W/ INTRAOCULAR LENS IMPLANT Left 06/26/2016   Dr. Mia Creek   CESAREAN SECTION     CHOLECYSTECTOMY     COLONOSCOPY  06/12/2011   Procedure: COLONOSCOPY;  Surgeon: Charna Elizabeth, MD;  Location: [facility];  Service: Endoscopy;  Laterality: N/A;   COLONOSCOPY N/A 03/17/2013   Procedure: COLONOSCOPY;  Surgeon: Charna Elizabeth, MD;  Location: [facility];  Service: Endoscopy;  Laterality: N/A;   FOOT ARTHRODESIS Right 07/13/2017   Procedure: ARTHRODESIS  FOOT-MULTI.FUSIONS (6 JOINTS);  Surgeon: Recardo Evangelist, DPM;  Location: [facility];  Service: Podiatry;  Laterality: Right;   IRRIGATION AND DEBRIDEMENT FOOT Right 08/09/2017   Procedure: IRRIGATION AND DEBRIDEMENT FOOT;  Surgeon: Recardo Evangelist, DPM;  Location: [facility];  Service: Podiatry;  Laterality: Right;   KNEE ARTHROSCOPY Bilateral    bilateral   LEFT HEART CATH AND CORONARY ANGIOGRAPHY N/A 06/04/2020   Procedure: LEFT HEART CATH AND CORONARY ANGIOGRAPHY;  Surgeon: Iran Ouch, MD;  Location: [facility];  Service: Cardiovascular;  Laterality: N/A;   REVERSE SHOULDER ARTHROPLASTY Right 11/16/2020   Procedure: REVERSE SHOULDER ARTHROPLASTY;  Surgeon: Christena Flake, MD;  Location: [facility];  Service: Orthopedics;  Laterality: Right;   REVERSE SHOULDER ARTHROPLASTY Left 10/03/2022   Procedure: REVERSE SHOULDER ARTHROPLASTY;  Surgeon: Christena Flake, MD;  Location: [facility];  Service: Orthopedics;  Laterality: Left;   SHOULDER CLOSED REDUCTION Right 11/11/2020   Procedure: CLOSED REDUCTION SHOULDER;  Surgeon: Christena Flake, MD;  Location: [facility];  Service: Orthopedics;  Laterality: Right;   SHOULDER CLOSED REDUCTION Left 10/02/2022   Procedure: CLOSED REDUCTION SHOULDER;  Surgeon: Reinaldo Berber, MD;  Location: [facility];  Service: Orthopedics;  Laterality: Left;   SHOULDER INJECTION Right 11/11/2020   Procedure: SHOULDER INJECTION;  Surgeon: Christena Flake, MD;  Location: [facility];  Service: Orthopedics;  Laterality: Right;   SINUSOTOMY     TOOTH EXTRACTION  12/2016   TOTAL SHOULDER REVISION Right 06/27/2022   Procedure: TOTAL SHOULDER REVISION;  Surgeon: Christena Flake, MD;  Location: [facility];  Service: Orthopedics;  Laterality: Right;    Current Medications: No outpatient medications have been marked as taking for the 02/23/23 encounter (Appointment) with Sondra Barges, PA-C.    Allergies:   Fluoxetine and Codeine   Social History   Socioeconomic History   Marital  status: Married    Spouse name: Rexford Maus   Number of children: 1   Years of education: Not on file   Highest education level: Bachelor's degree (e.g., BA, AB, BS)  Occupational History   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  Tobacco Use   Smoking status: Never    Passive exposure: Past   Smokeless tobacco: Never  Vaping Use   Vaping status: Never Used  Substance and Sexual Activity   Alcohol use: No   Drug use: No   Sexual activity: Not Currently  Other Topics Concern   Not on file  Social History Narrative   No living  will   Daughter should be decision maker   Would accept resuscitation attempts--but no prolonged ventilator or tube feeds   Social Drivers of Health   Financial Resource Strain: Low Risk  (06/19/2022)   Overall Financial Resource Strain (CARDIA)    Difficulty of Paying Living Expenses: Not hard at all  Food Insecurity: No Food Insecurity (10/02/2022)   Hunger Vital Sign    Worried About Running Out of Food in the Last Year: Never true    Ran Out of Food in the Last Year: Never true  Transportation Needs: No Transportation Needs (10/02/2022)   PRAPARE - Administrator, Civil Service (Medical): No    Lack of Transportation (Non-Medical): No  Physical Activity: Unknown (06/19/2022)   Exercise Vital Sign    Days of Exercise per Week: 0 days    Minutes of Exercise per Session: Not on file  Stress: No Stress Concern Present (06/19/2022)   [employer] of Occupational Health - Occupational Stress Questionnaire    Feeling of Stress : Only a little  Social Connections: Moderately Isolated (06/19/2022)   Social Connection and Isolation Panel [NHANES]    Frequency of Communication with Friends and Family: More than three times a week    Frequency of Social Gatherings with Friends and Family: Three times a week    Attends Religious Services: Never    Active Member of Clubs or Organizations: No    Attends Museum/gallery exhibitions officer: Not on file    Marital Status: Married     Family History:  The patient's family history includes Arthritis in her father; Breast cancer in her maternal aunt and sister; Cancer in her sister; Diabetes type II in her sister; Emphysema in her father; Parkinsonism in her mother; Tuberculosis in her mother and sister. There is no history of Colon cancer, Esophageal cancer, Pancreatic cancer, or Stomach cancer.  ROS:   12-point review of systems is negative unless otherwise noted in the HPI.   EKGs/Labs/Other Studies Reviewed:    Studies reviewed were summarized above. The additional studies were reviewed today:  LHC 06/04/2020: Dist LAD lesion is 30% stenosed. Ost LAD to Mid LAD lesion is 40% stenosed.   1.  No evidence of obstructive coronary artery disease.  Relatively stable mild to moderate proximal LAD disease. 2.  Left ventricular angiography was not performed.  EF is normal by echo. 3.  Mildly elevated left ventricular end-diastolic pressure.   Recommendations: Continue medical therapy. Eliquis can be resumed tomorrow morning. __________   Eugenie Birks MPI 05/31/2020: Abnormal pharmacologic myocardial perfusion stress test. There is a large in size, moderate in severity, completely reversible defect involving the mid anterior, anterolateral, and inferolateral segments, as well as the apical anterior and lateral segments. Defect is concerning for ischemia, though artifact cannot be excluded (shifting breast attenuation). Left ventricular systolic function is normal (LVEF 58%). Attenuation correction CT shows coronary artery calcification, aortic atherosclerosis, and dense mitral annular calcification. This is an intermediate risk study. Sensitivity and specificity of the study are degraded by the patient's morbid obesity. __________   2D echo 05/29/2020: 1. Left ventricular ejection fraction, by estimation, is 60 to 65%. The  left ventricle has normal function.  The left ventricle has no regional  wall motion abnormalities. There is mild left ventricular hypertrophy.  Left ventricular diastolic parameters  are consistent with Grade I diastolic dysfunction (impaired relaxation).   2. Right ventricular systolic function is normal. The right ventricular  size is normal. Tricuspid regurgitation signal  is inadequate for assessing  PA pressure.   3. Left atrial size was moderately dilated.  EKG:  EKG is ordered today.  The EKG ordered today demonstrates ***  Recent Labs: 06/19/2022: ALT 10 10/06/2022: BUN 35; Creatinine, Ser 1.46; Hemoglobin 8.7; Magnesium 1.8; Platelets 205; Potassium 4.3; Sodium 135  Recent Lipid Panel    Component Value Date/Time   CHOL 149 12/27/2021 1202   TRIG 160.0 (H) 12/27/2021 1202   HDL 46.60 12/27/2021 1202   CHOLHDL 3 12/27/2021 1202   VLDL 32.0 12/27/2021 1202   LDLCALC 71 12/27/2021 1202    PHYSICAL EXAM:    VS:  There were no vitals taken for this visit.  BMI: There is no height or weight on file to calculate BMI.  Physical Exam  Wt Readings from Last 3 Encounters:  01/09/23 222 lb (100.7 kg)  10/03/22 263 lb 14.3 oz (119.7 kg)  09/18/22 245 lb (111.1 kg)     ASSESSMENT & PLAN:   Nonobstructive CAD:  HFpEF:  PAF: ***.  CHA2DS2-VASc at least 5 (CHF, HTN, age x 1, vascular disease, sex category).   Lymphedema:  HTN: Blood pressure  HLD: LDL 71 in 11/2021.   CKD stage III:  Normocytic anemia:   {Are you ordering a CV Procedure (e.g. stress test, cath, DCCV, TEE, etc)?   Press F2        :[id number]}     Disposition: F/u with Dr. Mariah Milling or an APP in ***.   Medication Adjustments/Labs and Tests Ordered: Current medicines are reviewed at length with the patient today.  Concerns regarding medicines are outlined above. Medication changes, Labs and Tests ordered today are summarized above and listed in the Patient Instructions accessible in Encounters.   Signed, Eula Listen, PA-C 02/22/2023 9:52  AM     [facility] [place] [address] [place], [place] [address] [phone]

## 2023-02-23 ENCOUNTER — Ambulatory Visit: Payer: Medicare Other | Admitting: Physician Assistant

## 2023-03-05 DIAGNOSIS — S43012D Anterior subluxation of left humerus, subsequent encounter: Secondary | ICD-10-CM | POA: Diagnosis not present

## 2023-03-05 DIAGNOSIS — I130 Hypertensive heart and chronic kidney disease with heart failure and stage 1 through stage 4 chronic kidney disease, or unspecified chronic kidney disease: Secondary | ICD-10-CM | POA: Diagnosis not present

## 2023-03-05 DIAGNOSIS — M0680 Other specified rheumatoid arthritis, unspecified site: Secondary | ICD-10-CM | POA: Diagnosis not present

## 2023-03-05 DIAGNOSIS — I5032 Chronic diastolic (congestive) heart failure: Secondary | ICD-10-CM | POA: Diagnosis not present

## 2023-03-05 DIAGNOSIS — E1122 Type 2 diabetes mellitus with diabetic chronic kidney disease: Secondary | ICD-10-CM | POA: Diagnosis not present

## 2023-03-05 DIAGNOSIS — N1832 Chronic kidney disease, stage 3b: Secondary | ICD-10-CM | POA: Diagnosis not present

## 2023-03-06 ENCOUNTER — Other Ambulatory Visit: Payer: Self-pay | Admitting: Internal Medicine

## 2023-03-06 MED ORDER — OXYCODONE HCL 10 MG PO TABS: 5.0000 mg | ORAL_TABLET | Freq: Three times a day (TID) | ORAL | 0 refills | Status: DC | PRN

## 2023-03-06 NOTE — Telephone Encounter (Signed)
 Copied from CRM [id number]. Topic: Clinical - Medication Refill >> Mar 06, 2023  9:42 AM Macario HERO wrote: Most Recent Primary Care Visit:  Provider: JIMMY ADE I  Department: [facility]  Visit Type: OFFICE VISIT  Date: 01/09/2023  Medication: Oxycodone  HCl 10 MG TABS [[id number]]  Has the patient contacted their pharmacy? Yes (Agent: If no, request that the patient contact the pharmacy for the refill. If patient does not wish to contact the pharmacy document the reason why and proceed with request.) (Agent: If yes, when and what did the pharmacy advise?)  Is this the correct pharmacy for this prescription? Yes If no, delete pharmacy and type the correct one.  This is the patient's preferred pharmacy:  [id number] - [place], [place] - [address] AT [facility] [address] [facility] [address] [place] [address] Phone: [phone] Fax: [phone]    Has the prescription been filled recently? Yes  Is the patient out of the medication? No, almost out.  Has the patient been seen for an appointment in the last year OR does the patient have an upcoming appointment? Yes  Can we respond through MyChart? Yes  Agent: Please be advised that Rx refills may take up to 3 business days. We ask that you follow-up with your pharmacy.

## 2023-03-07 ENCOUNTER — Other Ambulatory Visit: Payer: Self-pay | Admitting: Cardiovascular Disease

## 2023-03-07 DIAGNOSIS — I480 Paroxysmal atrial fibrillation: Secondary | ICD-10-CM

## 2023-03-08 DIAGNOSIS — I2510 Atherosclerotic heart disease of native coronary artery without angina pectoris: Secondary | ICD-10-CM | POA: Diagnosis not present

## 2023-03-08 DIAGNOSIS — G2581 Restless legs syndrome: Secondary | ICD-10-CM | POA: Diagnosis not present

## 2023-03-08 DIAGNOSIS — M353 Polymyalgia rheumatica: Secondary | ICD-10-CM | POA: Diagnosis not present

## 2023-03-08 DIAGNOSIS — I5032 Chronic diastolic (congestive) heart failure: Secondary | ICD-10-CM | POA: Diagnosis not present

## 2023-03-08 DIAGNOSIS — J4489 Other specified chronic obstructive pulmonary disease: Secondary | ICD-10-CM | POA: Diagnosis not present

## 2023-03-08 DIAGNOSIS — E1122 Type 2 diabetes mellitus with diabetic chronic kidney disease: Secondary | ICD-10-CM | POA: Diagnosis not present

## 2023-03-08 DIAGNOSIS — M0680 Other specified rheumatoid arthritis, unspecified site: Secondary | ICD-10-CM | POA: Diagnosis not present

## 2023-03-08 DIAGNOSIS — S43012D Anterior subluxation of left humerus, subsequent encounter: Secondary | ICD-10-CM | POA: Diagnosis not present

## 2023-03-08 DIAGNOSIS — N1832 Chronic kidney disease, stage 3b: Secondary | ICD-10-CM | POA: Diagnosis not present

## 2023-03-08 DIAGNOSIS — I480 Paroxysmal atrial fibrillation: Secondary | ICD-10-CM | POA: Diagnosis not present

## 2023-03-08 DIAGNOSIS — D631 Anemia in chronic kidney disease: Secondary | ICD-10-CM | POA: Diagnosis not present

## 2023-03-08 DIAGNOSIS — I130 Hypertensive heart and chronic kidney disease with heart failure and stage 1 through stage 4 chronic kidney disease, or unspecified chronic kidney disease: Secondary | ICD-10-CM | POA: Diagnosis not present

## 2023-03-12 DIAGNOSIS — G4700 Insomnia, unspecified: Secondary | ICD-10-CM | POA: Diagnosis not present

## 2023-03-12 DIAGNOSIS — L89312 Pressure ulcer of right buttock, stage 2: Secondary | ICD-10-CM | POA: Diagnosis not present

## 2023-03-12 DIAGNOSIS — I480 Paroxysmal atrial fibrillation: Secondary | ICD-10-CM | POA: Diagnosis not present

## 2023-03-12 DIAGNOSIS — M797 Fibromyalgia: Secondary | ICD-10-CM | POA: Diagnosis not present

## 2023-03-12 DIAGNOSIS — L89322 Pressure ulcer of left buttock, stage 2: Secondary | ICD-10-CM | POA: Diagnosis not present

## 2023-03-12 DIAGNOSIS — L89152 Pressure ulcer of sacral region, stage 2: Secondary | ICD-10-CM | POA: Diagnosis not present

## 2023-03-12 DIAGNOSIS — G2581 Restless legs syndrome: Secondary | ICD-10-CM | POA: Diagnosis not present

## 2023-03-12 DIAGNOSIS — E1122 Type 2 diabetes mellitus with diabetic chronic kidney disease: Secondary | ICD-10-CM | POA: Diagnosis not present

## 2023-03-12 DIAGNOSIS — G894 Chronic pain syndrome: Secondary | ICD-10-CM | POA: Diagnosis not present

## 2023-03-12 DIAGNOSIS — D631 Anemia in chronic kidney disease: Secondary | ICD-10-CM | POA: Diagnosis not present

## 2023-03-12 DIAGNOSIS — E782 Mixed hyperlipidemia: Secondary | ICD-10-CM | POA: Diagnosis not present

## 2023-03-12 DIAGNOSIS — J4489 Other specified chronic obstructive pulmonary disease: Secondary | ICD-10-CM | POA: Diagnosis not present

## 2023-03-12 DIAGNOSIS — K5900 Constipation, unspecified: Secondary | ICD-10-CM | POA: Diagnosis not present

## 2023-03-12 DIAGNOSIS — N1832 Chronic kidney disease, stage 3b: Secondary | ICD-10-CM | POA: Diagnosis not present

## 2023-03-12 DIAGNOSIS — G43909 Migraine, unspecified, not intractable, without status migrainosus: Secondary | ICD-10-CM | POA: Diagnosis not present

## 2023-03-12 DIAGNOSIS — I130 Hypertensive heart and chronic kidney disease with heart failure and stage 1 through stage 4 chronic kidney disease, or unspecified chronic kidney disease: Secondary | ICD-10-CM | POA: Diagnosis not present

## 2023-03-12 DIAGNOSIS — M353 Polymyalgia rheumatica: Secondary | ICD-10-CM | POA: Diagnosis not present

## 2023-03-12 DIAGNOSIS — M51379 Other intervertebral disc degeneration, lumbosacral region without mention of lumbar back pain or lower extremity pain: Secondary | ICD-10-CM | POA: Diagnosis not present

## 2023-03-12 DIAGNOSIS — S43012D Anterior subluxation of left humerus, subsequent encounter: Secondary | ICD-10-CM | POA: Diagnosis not present

## 2023-03-12 DIAGNOSIS — I2510 Atherosclerotic heart disease of native coronary artery without angina pectoris: Secondary | ICD-10-CM | POA: Diagnosis not present

## 2023-03-12 DIAGNOSIS — M0680 Other specified rheumatoid arthritis, unspecified site: Secondary | ICD-10-CM | POA: Diagnosis not present

## 2023-03-12 DIAGNOSIS — G4733 Obstructive sleep apnea (adult) (pediatric): Secondary | ICD-10-CM | POA: Diagnosis not present

## 2023-03-12 DIAGNOSIS — M1990 Unspecified osteoarthritis, unspecified site: Secondary | ICD-10-CM | POA: Diagnosis not present

## 2023-03-12 DIAGNOSIS — I5032 Chronic diastolic (congestive) heart failure: Secondary | ICD-10-CM | POA: Diagnosis not present

## 2023-03-12 DIAGNOSIS — K219 Gastro-esophageal reflux disease without esophagitis: Secondary | ICD-10-CM | POA: Diagnosis not present

## 2023-03-15 DIAGNOSIS — I5032 Chronic diastolic (congestive) heart failure: Secondary | ICD-10-CM | POA: Diagnosis not present

## 2023-03-15 DIAGNOSIS — E1122 Type 2 diabetes mellitus with diabetic chronic kidney disease: Secondary | ICD-10-CM | POA: Diagnosis not present

## 2023-03-15 DIAGNOSIS — I130 Hypertensive heart and chronic kidney disease with heart failure and stage 1 through stage 4 chronic kidney disease, or unspecified chronic kidney disease: Secondary | ICD-10-CM | POA: Diagnosis not present

## 2023-03-15 DIAGNOSIS — M0680 Other specified rheumatoid arthritis, unspecified site: Secondary | ICD-10-CM | POA: Diagnosis not present

## 2023-03-15 DIAGNOSIS — S43012D Anterior subluxation of left humerus, subsequent encounter: Secondary | ICD-10-CM | POA: Diagnosis not present

## 2023-03-15 DIAGNOSIS — N1832 Chronic kidney disease, stage 3b: Secondary | ICD-10-CM | POA: Diagnosis not present

## 2023-03-16 DIAGNOSIS — Z96611 Presence of right artificial shoulder joint: Secondary | ICD-10-CM | POA: Diagnosis not present

## 2023-03-16 DIAGNOSIS — Z96612 Presence of left artificial shoulder joint: Secondary | ICD-10-CM | POA: Diagnosis not present

## 2023-03-19 DIAGNOSIS — M150 Primary generalized (osteo)arthritis: Secondary | ICD-10-CM | POA: Diagnosis not present

## 2023-03-19 DIAGNOSIS — M0579 Rheumatoid arthritis with rheumatoid factor of multiple sites without organ or systems involvement: Secondary | ICD-10-CM | POA: Diagnosis not present

## 2023-03-19 DIAGNOSIS — Z79899 Other long term (current) drug therapy: Secondary | ICD-10-CM | POA: Diagnosis not present

## 2023-03-20 ENCOUNTER — Other Ambulatory Visit: Payer: Self-pay | Admitting: Internal Medicine

## 2023-03-20 DIAGNOSIS — I5032 Chronic diastolic (congestive) heart failure: Secondary | ICD-10-CM

## 2023-03-20 MED ORDER — TORSEMIDE 20 MG PO TABS: 40.0000 mg | ORAL_TABLET | Freq: Every day | ORAL | 5 refills | Status: DC

## 2023-03-20 NOTE — Telephone Encounter (Signed)
Copied from CRM [id number]. Topic: Clinical - Medication Refill >> Mar 20, 2023 11:11 AM Drema Balzarine wrote: Most Recent Primary Care Visit:  Provider: Tillman Abide I  Department: [facility]  Visit Type: OFFICE VISIT  Date: 01/09/2023  Medication: torsemide (DEMADEX) 20 MG tablet  Has the patient contacted their pharmacy? Yes (Agent: If no, request that the patient contact the pharmacy for the refill. If patient does not wish to contact the pharmacy document the reason why and proceed with request.) (Agent: If yes, when and what did the pharmacy advise?)  Is this the correct pharmacy for this prescription? Yes If no, delete pharmacy and type the correct one.  This is the patient's preferred pharmacy:   [id number] - [place], [place] - [address] AT [facility] [address] [facility] [address] [place] [address] Phone: [phone] Fax: [phone]    Has the prescription been filled recently? Yes  Is the patient out of the medication? No  Has the patient been seen for an appointment in the last year OR does the patient have an upcoming appointment? Yes  Can we respond through MyChart? No  Agent: Please be advised that Rx refills may take up to 3 business days. We ask that you follow-up with your pharmacy.

## 2023-03-22 DIAGNOSIS — M0680 Other specified rheumatoid arthritis, unspecified site: Secondary | ICD-10-CM | POA: Diagnosis not present

## 2023-03-22 DIAGNOSIS — S43012D Anterior subluxation of left humerus, subsequent encounter: Secondary | ICD-10-CM | POA: Diagnosis not present

## 2023-03-22 DIAGNOSIS — I130 Hypertensive heart and chronic kidney disease with heart failure and stage 1 through stage 4 chronic kidney disease, or unspecified chronic kidney disease: Secondary | ICD-10-CM | POA: Diagnosis not present

## 2023-03-22 DIAGNOSIS — E1122 Type 2 diabetes mellitus with diabetic chronic kidney disease: Secondary | ICD-10-CM | POA: Diagnosis not present

## 2023-03-22 DIAGNOSIS — I5032 Chronic diastolic (congestive) heart failure: Secondary | ICD-10-CM | POA: Diagnosis not present

## 2023-03-22 DIAGNOSIS — N1832 Chronic kidney disease, stage 3b: Secondary | ICD-10-CM | POA: Diagnosis not present

## 2023-03-27 ENCOUNTER — Encounter: Payer: Self-pay | Admitting: Physician Assistant

## 2023-03-27 ENCOUNTER — Ambulatory Visit: Payer: Medicare Other | Attending: Physician Assistant | Admitting: Physician Assistant

## 2023-03-27 VITALS — BP 124/80 | HR 54 | Ht 63.0 in | Wt 234.0 lb

## 2023-03-27 DIAGNOSIS — I5032 Chronic diastolic (congestive) heart failure: Secondary | ICD-10-CM | POA: Diagnosis not present

## 2023-03-27 DIAGNOSIS — I890 Lymphedema, not elsewhere classified: Secondary | ICD-10-CM | POA: Insufficient documentation

## 2023-03-27 DIAGNOSIS — E782 Mixed hyperlipidemia: Secondary | ICD-10-CM | POA: Insufficient documentation

## 2023-03-27 DIAGNOSIS — I2510 Atherosclerotic heart disease of native coronary artery without angina pectoris: Secondary | ICD-10-CM | POA: Diagnosis not present

## 2023-03-27 DIAGNOSIS — I480 Paroxysmal atrial fibrillation: Secondary | ICD-10-CM | POA: Diagnosis not present

## 2023-03-27 DIAGNOSIS — I10 Essential (primary) hypertension: Secondary | ICD-10-CM | POA: Diagnosis not present

## 2023-03-27 NOTE — Patient Instructions (Signed)
Medication Instructions:  Your physician recommends that you continue on your current medications as directed. Please refer to the Current Medication list given to you today.  *If you need a refill on your cardiac medications before your next appointment, please call your pharmacy*  Lab Work: None If you have labs (blood work) drawn today and your tests are completely normal, you will receive your results only by: MyChart Message (if you have MyChart) OR A paper copy in the mail If you have any lab test that is abnormal or we need to change your treatment, we will call you to review the results.   Follow-Up: At [facility], you and your health needs are our priority.  As part of our continuing mission to provide you with exceptional heart care, we have created designated Provider Care Teams.  These Care Teams include your primary Cardiologist (physician) and Advanced Practice Providers (APPs -  Physician Assistants and Nurse Practitioners) who all work together to provide you with the care you need, when you need it.   Your next appointment:   6 month(s)  Provider:   Julien Nordmann, MD

## 2023-03-27 NOTE — Progress Notes (Signed)
Cardiology Office Note    Date:  03/27/2023   ID:  Sacheen, Arrasmith 10-11-1950, MRN [id number]  PCP:  Karie Schwalbe, MD  Cardiologist:  Julien Nordmann, MD  Electrophysiologist:  None   Chief Complaint: Follow up  History of Present Illness:   LAURRIE TOPPIN is a 73 y.o. female with history of nonobstructive CAD by LHC in 05/2020, HFpEF, PAF on Eliquis, CKD stage III, HTN, HLD, anemia, COPD, rheumatoid arthritis, fibromyalgia, falls, morbid obesity, H. pylori, and OSA who presents for follow-up of HFpEF and A-fib.   LHC in 2012 showed nonobstructive CAD.  She was diagnosed with A. fib in 2012.  Echo in 2016 showed an EF of 55 to 60%, no regional wall motion abnormalities, grade 2 diastolic dysfunction, and mild mitral regurgitation.  She was admitted in 02/2019 with weakness and multiple falls and acute on chronic diastolic CHF.  Echo showed an EF of 55 to 60%, mild LVH, grade 1 diastolic dysfunction, no regional wall motion abnormalities, mildly to moderately dilated left atrium, moderate mitral valve calcification without regurgitation or stenosis.  She was admitted in 12/2019 with volume overload and weight gain.  She was IV diuresed.  Echo showed an EF of 60 to 65%, no regional wall motion abnormalities, mild LVH, normal LV diastolic function parameters, normal RV systolic function and ventricular cavity size, trivial mitral regurgitation, and trivial aortic valve insufficiency.  She was admitted in 05/2020 with  acute hypoxic respiratory failure due to pulmonary edema in the setting of acute on chronic diastolic CHF and A. fib with RVR.  She converted to sinus rhythm with amiodarone.  Echo demonstrated an EF of 60 to 65%, no regional wall motion abnormalities, mild LVH, grade 1 diastolic dysfunction, normal RV systolic function and ventricular cavity size, and a moderately dilated left atrium.  Lexiscan MPI was abnormal with a large in size, moderate in severity, completely reversible  defect involving the mid anterior, anterolateral, and inferolateral segments as well as the apical anterior and lateral segments.  The defect was concerning for ischemia, though artifact could not be excluded.  LVEF was normal.  Attenuation corrected CT imaging showed coronary artery calcification, aortic atherosclerosis, and dense mitral annular calcification.  Overall, this was an intermediate restudy.  Given abnormal MPI, she underwent LHC on 06/04/2020 which showed no evidence of obstructive CAD with ostial to mid LAD 40% stenosis and distal LAD 30% stenosis.   She was seen in the office in 07/2020 with dramatic documented weight loss trending from 353 in 06/2020 trending to 301 in 07/2020.  Recommendation to continue torsemide 40 mg daily with an extra 40 mg as needed and hold metolazone unless weight was greater than 310 pounds.  Phone note after that visit showed to the patient was woken up on 08/03/2020 with chest pain and shortness of breath lasting approximately 45 minutes.  Patient indicated EMS came out with patient reporting EKG showed A-fib.  Note indicates she refused transport to the ED.   She was admitted to the hospital in 03/2021 with acute metabolic encephalopathy felt to likely be related to polypharmacy with oxycodone, gabapentin, and recent addition of Phenergan in the context of acute on CKD.  She had fallen numerous times prior to her admission.  Head CT was negative for acute process.  Ammonia normal.  High-sensitivity troponin normal x3.  EKG showed sinus rhythm without acute ST-T changes.   She was admitted to the hospital in 08/2021 with weakness and acute on  CKD stage IIIb with severe hypokalemia with a potassium of 2.5.  She had been taking metolazone on a daily basis.  Admission serum creatinine was 2.37 with a baseline around 1.2.     She was seen in the office in 02/2022.  Her weight was down greater than 50 pounds compared to revisit in 06/2021, however she continued to note  fluctuations with intermittent rapid weight gain.  Caretaker indicated this was in the setting of increased sodium intake at times.  Lower extremity swelling was significantly improved.  She was maintaining sinus rhythm.   She was most recently seen in the office in 07/2022 reporting fluctuations in weight without significant dyspnea.  She was dealing with postoperative wound infection from right shoulder replacement requiring wound VAC.  By our scale, her weight was down 9 pounds when compared to revisit in 02/2022.  She comes in by her home health aide.  She is without symptoms of angina or cardiac decompensation.  No dizziness, presyncope, or syncope.  No progressive orthopnea.  She is concerned about nuisance bruising along her upper extremities.  She also reports her legs continue to be swollen.  Her weight is down 7 pounds today when compared to her visit in 07/2022.  Currently, her home health aide gives the patient torsemide 20 mg or 40 mg daily based on lower extremity swelling.  Since she was last seen she has given metolazone to 3 or 4 times in total.  She does elevate her legs when sitting.   Labs independently reviewed: 02/2023 - AST/ALT normal, albumin 3.9, Hgb 10.8, PLT 242, serum creatinine 1.2 12/2022 - BUN 25, potassium 3.5 09/2022 - magnesium 1.8 11/2021 - TSH normal, TC 149, TG 160, HDL 46, LDL 71  Past Medical History:  Diagnosis Date   (HFpEF) heart failure with preserved ejection fraction (HCC)    a.) TTE 08/10/2014: EF 55-60%, mild MAC, mild LAE, mild MR, PASP 38; b.) TTE 03/31/2019: EF 55-60%, mild LVH, mild-mod LAW, mod MAC, mild-mod AoV sclerosis, triv PR, G1DD; c.) TTE 05/29/2020: EF 60-65%, mild LVH, mod LAE, G1DD   Anemia    Aortic atherosclerosis (HCC)    Asthma    Basal cell carcinoma    CAD (coronary artery disease)    a.) LHC 05/24/2010 - nonobstructive CAD - med mgmt; b.) LHC 06/04/2020: 30% dLAD, 40% o-mLAD - med mgmt   Chronic, continuous use of opioids     Claustrophobia    COPD (chronic obstructive pulmonary disease) (HCC)    DDD (degenerative disc disease), lumbosacral    Diuretic-induced hypokalemia    Dyslipidemia    Fibromyalgia    Frequent falls    GERD (gastroesophageal reflux disease)    Gouty arthropathy    Hemihypertrophy    History of bilateral cataract extraction 2018   Hyperplastic colonic polyp 2003   Hypertension    Long term current use of amiodarone    Long term current use of anticoagulant    a.) apixaban   Long term current use of immunosuppressive drug    a.) certolizumab pegol  + predisone for RA diagnosis   MDD (major depressive disorder)    Migraines    Morbid obesity (HCC)    Nontraumatic complete tear of rotator cuff, left    OSA (obstructive sleep apnea)    a.) non-complaint with prescribed nocturnal PAP therapy   Osteoarthritis    PAF (paroxysmal atrial fibrillation) (HCC)    a.) CHA2DS2-VASc = 5 (age, sex, CHF, HTN, vascular disease history); b.)  rate and rhythm maintained on oral amiodarone + metoprolol succinate; chronically anticoagulated with standard dose apixaban   Pneumonia    PONV (postoperative nausea and vomiting)    Prediabetes    Rheumatoid arthritis (HCC)    a.) on certolizumab pegol + predisone   Stage 3 chronic kidney disease (HCC)     Past Surgical History:  Procedure Laterality Date   ABDOMINAL HYSTERECTOMY     APPLICATION OF WOUND VAC Right 08/09/2017   Procedure: APPLICATION OF WOUND VAC;  Surgeon: Recardo Evangelist, DPM;  Location: [facility];  Service: Podiatry;  Laterality: Right;   CARDIAC CATHETERIZATION  05/24/2010   nonobstructive CAD   CATARACT EXTRACTION W/ INTRAOCULAR LENS IMPLANT Right 06/12/2016   Dr. Mia Creek   CATARACT EXTRACTION W/ INTRAOCULAR LENS IMPLANT Left 06/26/2016   Dr. Mia Creek   CESAREAN SECTION     CHOLECYSTECTOMY     COLONOSCOPY  06/12/2011   Procedure: COLONOSCOPY;  Surgeon: Charna Elizabeth, MD;  Location: [facility];  Service: Endoscopy;   Laterality: N/A;   COLONOSCOPY N/A 03/17/2013   Procedure: COLONOSCOPY;  Surgeon: Charna Elizabeth, MD;  Location: [facility];  Service: Endoscopy;  Laterality: N/A;   FOOT ARTHRODESIS Right 07/13/2017   Procedure: ARTHRODESIS FOOT-MULTI.FUSIONS (6 JOINTS);  Surgeon: Recardo Evangelist, DPM;  Location: [facility];  Service: Podiatry;  Laterality: Right;   IRRIGATION AND DEBRIDEMENT FOOT Right 08/09/2017   Procedure: IRRIGATION AND DEBRIDEMENT FOOT;  Surgeon: Recardo Evangelist, DPM;  Location: [facility];  Service: Podiatry;  Laterality: Right;   KNEE ARTHROSCOPY Bilateral    bilateral   LEFT HEART CATH AND CORONARY ANGIOGRAPHY N/A 06/04/2020   Procedure: LEFT HEART CATH AND CORONARY ANGIOGRAPHY;  Surgeon: Iran Ouch, MD;  Location: [facility];  Service: Cardiovascular;  Laterality: N/A;   REVERSE SHOULDER ARTHROPLASTY Right 11/16/2020   Procedure: REVERSE SHOULDER ARTHROPLASTY;  Surgeon: Christena Flake, MD;  Location: [facility];  Service: Orthopedics;  Laterality: Right;   REVERSE SHOULDER ARTHROPLASTY Left 10/03/2022   Procedure: REVERSE SHOULDER ARTHROPLASTY;  Surgeon: Christena Flake, MD;  Location: [facility];  Service: Orthopedics;  Laterality: Left;   SHOULDER CLOSED REDUCTION Right 11/11/2020   Procedure: CLOSED REDUCTION SHOULDER;  Surgeon: Christena Flake, MD;  Location: [facility];  Service: Orthopedics;  Laterality: Right;   SHOULDER CLOSED REDUCTION Left 10/02/2022   Procedure: CLOSED REDUCTION SHOULDER;  Surgeon: Reinaldo Berber, MD;  Location: [facility];  Service: Orthopedics;  Laterality: Left;   SHOULDER INJECTION Right 11/11/2020   Procedure: SHOULDER INJECTION;  Surgeon: Christena Flake, MD;  Location: [facility];  Service: Orthopedics;  Laterality: Right;   SINUSOTOMY     TOOTH EXTRACTION  12/2016   TOTAL SHOULDER REVISION Right 06/27/2022   Procedure: TOTAL SHOULDER REVISION;  Surgeon: Christena Flake, MD;  Location: [facility];  Service: Orthopedics;  Laterality: Right;    Current  Medications: Current Meds  Medication Sig   amiodarone (PACERONE) 200 MG tablet TAKE 1 TABLET(200 MG) BY MOUTH DAILY   apixaban (ELIQUIS) 5 MG TABS tablet Take 1 tablet (5 mg total) by mouth 2 (two) times daily.   atorvastatin (LIPITOR) 40 MG tablet TAKE 1 TABLET(40 MG) BY MOUTH DAILY   Certolizumab Pegol (CIMZIA [place]) Inject into the skin. Patient states taking 1 x per month   leflunomide (ARAVA) 20 MG tablet Take 20 mg by mouth daily.   metolazone (ZAROXOLYN) 2.5 MG tablet TAKE 1 TABLET BY MOUTH DAILY AS NEEDED. MAY GIVE 1 TABLET AS NEEDED FOR WEIGHT GAIN  OF 5 LB IN 1 WEEK   metoprolol succinate (TOPROL-XL) 50 MG 24 hr tablet TAKE 1 TABLET BY MOUTH EVERY DAY WITH OR IMMEDIATELY FOLLOWING A MEAL (Patient taking differently: Take 50 mg by mouth at bedtime. TAKE 1 TABLET BY MOUTH EVERY DAY WITH OR IMMEDIATELY FOLLOWING A MEAL)   montelukast (SINGULAIR) 10 MG tablet TAKE 1 TABLET BY MOUTH AT BEDTIME   nystatin (MYCOSTATIN/NYSTOP) powder Apply 1 application topically 3 (three) times daily as needed.   omeprazole (PRILOSEC) 40 MG capsule Take 1 capsule (40 mg total) by mouth at bedtime.   Oxycodone HCl 10 MG TABS Take 0.5-1 tablets (5-10 mg total) by mouth 3 (three) times daily as needed.   polyethylene glycol (MIRALAX / GLYCOLAX) packet Take 17 g by mouth daily as needed for mild constipation.   potassium chloride SA (KLOR-CON M) 20 MEQ tablet Take 1 tablet (20 mEq total) by mouth daily.   predniSONE (DELTASONE) 10 MG tablet Take by mouth.   senna-docusate (SENOKOT-S) 8.6-50 MG tablet Take 2 tablets by mouth at bedtime.   spironolactone (ALDACTONE) 25 MG tablet TAKE 1 TABLET(25 MG) BY MOUTH DAILY   terazosin (HYTRIN) 10 MG capsule TAKE 1 CAPSULE(10 MG) BY MOUTH AT BEDTIME   torsemide (DEMADEX) 20 MG tablet Take 2 tablets (40 mg total) by mouth daily.   traMADol (ULTRAM) 50 MG tablet Take by mouth.    Allergies:   Fluoxetine and Codeine   Social History   Socioeconomic History   Marital  status: Married    Spouse name: Rexford Maus   Number of children: 1   Years of education: Not on file   Highest education level: Bachelor's degree (e.g., BA, AB, BS)  Occupational History   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  Tobacco Use   Smoking status: Never    Passive exposure: Past   Smokeless tobacco: Never  Vaping Use   Vaping status: Never Used  Substance and Sexual Activity   Alcohol use: No   Drug use: No   Sexual activity: Not Currently  Other Topics Concern   Not on file  Social History Narrative   No living will   Daughter should be decision maker   Would accept resuscitation attempts--but no prolonged ventilator or tube feeds   Social Drivers of Health   Financial Resource Strain: Low Risk  (06/19/2022)   Overall Financial Resource Strain (CARDIA)    Difficulty of Paying Living Expenses: Not hard at all  Food Insecurity: No Food Insecurity (10/02/2022)   Hunger Vital Sign    Worried About Running Out of Food in the Last Year: Never true    Ran Out of Food in the Last Year: Never true  Transportation Needs: No Transportation Needs (10/02/2022)   PRAPARE - Administrator, Civil Service (Medical): No    Lack of Transportation (Non-Medical): No  Physical Activity: Unknown (06/19/2022)   Exercise Vital Sign    Days of Exercise per Week: 0 days    Minutes of Exercise per Session: Not on file  Stress: No Stress Concern Present (06/19/2022)   [employer] of Occupational Health - Occupational Stress Questionnaire    Feeling of Stress : Only a little  Social Connections: Moderately Isolated (06/19/2022)   Social Connection and Isolation Panel [NHANES]    Frequency of Communication with Friends and Family: More than three times a week    Frequency of Social Gatherings with Friends and Family: Three times a  week    Attends Religious Services: Never    Active Member of Clubs or Organizations: No    Attends Museum/gallery exhibitions officer: Not on file    Marital Status: Married     Family History:  The patient's family history includes Arthritis in her father; Breast cancer in her maternal aunt and sister; Cancer in her sister; Diabetes type II in her sister; Emphysema in her father; Parkinsonism in her mother; Tuberculosis in her mother and sister. There is no history of Colon cancer, Esophageal cancer, Pancreatic cancer, or Stomach cancer.  ROS:   12-point review of systems is negative unless otherwise noted in the HPI.   EKGs/Labs/Other Studies Reviewed:    Studies reviewed were summarized above. The additional studies were reviewed today:  LHC 06/04/2020: Dist LAD lesion is 30% stenosed. Ost LAD to Mid LAD lesion is 40% stenosed.   1.  No evidence of obstructive coronary artery disease.  Relatively stable mild to moderate proximal LAD disease. 2.  Left ventricular angiography was not performed.  EF is normal by echo. 3.  Mildly elevated left ventricular end-diastolic pressure.   Recommendations: Continue medical therapy. Eliquis can be resumed tomorrow morning. __________   Eugenie Birks MPI 05/31/2020: Abnormal pharmacologic myocardial perfusion stress test. There is a large in size, moderate in severity, completely reversible defect involving the mid anterior, anterolateral, and inferolateral segments, as well as the apical anterior and lateral segments. Defect is concerning for ischemia, though artifact cannot be excluded (shifting breast attenuation). Left ventricular systolic function is normal (LVEF 58%). Attenuation correction CT shows coronary artery calcification, aortic atherosclerosis, and dense mitral annular calcification. This is an intermediate risk study. Sensitivity and specificity of the study are degraded by the patient's morbid obesity. __________   2D echo 05/29/2020: 1. Left ventricular ejection fraction, by estimation, is 60 to 65%. The  left ventricle has normal function.  The left ventricle has no regional  wall motion abnormalities. There is mild left ventricular hypertrophy.  Left ventricular diastolic parameters  are consistent with Grade I diastolic dysfunction (impaired relaxation).   2. Right ventricular systolic function is normal. The right ventricular  size is normal. Tricuspid regurgitation signal is inadequate for assessing  PA pressure.   3. Left atrial size was moderately dilated.   EKG:  EKG is ordered today.  The EKG ordered today demonstrates sinus bradycardia, 54 bpm, nonspecific ST/T changes  Recent Labs: 06/19/2022: ALT 10 10/06/2022: BUN 35; Creatinine, Ser 1.46; Hemoglobin 8.7; Magnesium 1.8; Platelets 205; Potassium 4.3; Sodium 135  Recent Lipid Panel    Component Value Date/Time   CHOL 149 12/27/2021 1202   TRIG 160.0 (H) 12/27/2021 1202   HDL 46.60 12/27/2021 1202   CHOLHDL 3 12/27/2021 1202   VLDL 32.0 12/27/2021 1202   LDLCALC 71 12/27/2021 1202    PHYSICAL EXAM:    VS:  BP 124/80 (BP Location: Left Arm, Patient Position: Sitting, Cuff Size: Normal)   Pulse (!) 54   Ht 5\' 3"  (1.6 m)   Wt 234 lb (106.1 kg)   SpO2 96%   BMI 41.45 kg/m   BMI: Body mass index is 41.45 kg/m.  Physical Exam Vitals reviewed.  Constitutional:      Appearance: She is well-developed.  HENT:     Head: Normocephalic and atraumatic.  Eyes:     General:        Right eye: No discharge.        Left eye: No discharge.  Neck:  Comments: JVD difficult to assess secondary to body habitus. Cardiovascular:     Rate and Rhythm: Normal rate and regular rhythm.     Heart sounds: Normal heart sounds, S1 normal and S2 normal. Heart sounds not distant. No midsystolic click and no opening snap. No murmur heard.    No friction rub.  Pulmonary:     Effort: Pulmonary effort is normal. No respiratory distress.     Breath sounds: Normal breath sounds. No decreased breath sounds, wheezing, rhonchi or rales.  Chest:     Chest wall: No tenderness.   Abdominal:     General: There is no distension.  Musculoskeletal:     Cervical back: Normal range of motion.     Right lower leg: No edema.     Left lower leg: No edema.     Comments: No lower extremity swelling bilaterally.  Skin:    General: Skin is warm and dry.     Nails: There is no clubbing.  Neurological:     Mental Status: She is alert and oriented to person, place, and time.  Psychiatric:        Speech: Speech normal.        Behavior: Behavior normal.        Thought Content: Thought content normal.        Judgment: Judgment normal.     Wt Readings from Last 3 Encounters:  03/27/23 234 lb (106.1 kg)  01/09/23 222 lb (100.7 kg)  10/03/22 263 lb 14.3 oz (119.7 kg)     ASSESSMENT & PLAN:   Nonobstructive CAD: She is without symptoms suggestive of angina or cardiac decompensation.  She remains on apixaban in place of aspirin to minimize bleeding risk given underlying A-fib.  Continue aggressive risk factor modification and primary prevention including atorvastatin and metoprolol.  No indication for ischemic testing at this time.  PAF: Maintaining sinus rhythm on Toprol-XL 50 mg and amiodarone 200 mg.  CHA2DS2-VASc at least 5.  She remains on apixaban 5 mg twice daily and does not meet reduced dosing criteria.  Minimal nuisance bruising noted.  Stable labs.  HFpEF: Appears euvolemic and well compensated to possibly volume depleted.  Continue torsemide 40 mg daily with metolazone as needed with KCl, uses sparingly.  Remains on spironolactone.  Not currently on SGLT2 inhibitor given concern for off target effect.  Lymphedema: Much improved.  Continue leg elevation.  Follow-up with vascular surgery as directed.  HTN: Blood pressure is well-controlled in the office today.  Continue current pharmacotherapy.  HLD: LDL 71.  She remains on atorvastatin 40 mg.    Disposition: F/u with Dr. Mariah Milling in 6 months.   Medication Adjustments/Labs and Tests Ordered: Current medicines  are reviewed at length with the patient today.  Concerns regarding medicines are outlined above. Medication changes, Labs and Tests ordered today are summarized above and listed in the Patient Instructions accessible in Encounters.   Signed, Eula Listen, PA-C 03/27/2023 4:22 PM     [facility] [place] [address] [place], [place] [address] [phone]

## 2023-04-02 ENCOUNTER — Other Ambulatory Visit: Payer: Self-pay | Admitting: Internal Medicine

## 2023-04-02 MED ORDER — OXYCODONE HCL 10 MG PO TABS: 5.0000 mg | ORAL_TABLET | Freq: Three times a day (TID) | ORAL | 0 refills | Status: DC | PRN

## 2023-04-02 NOTE — Telephone Encounter (Signed)
Last Fill: 03/06/23  Last OV: 01/09/23 Next OV: 04/17/23  Routing to provider for review/authorization.

## 2023-04-02 NOTE — Telephone Encounter (Signed)
Copied from CRM [id number]. Topic: Clinical - Medication Refill >> Apr 02, 2023  4:18 PM Drema Balzarine wrote: Most Recent Primary Care Visit:  Provider: Tillman Abide I  Department: [facility]  Visit Type: OFFICE VISIT  Date: 01/09/2023  Medication: Oxycodone  Has the patient contacted their pharmacy? Yes (Agent: If no, request that the patient contact the pharmacy for the refill. If patient does not wish to contact the pharmacy document the reason why and proceed with request.) (Agent: If yes, when and what did the pharmacy advise?)  Is this the correct pharmacy for this prescription? Yes If no, delete pharmacy and type the correct one.  This is the patient's preferred pharmacy:   [id number] - [place], [place] - [address] [facility] [address] [facility] [address] [place] [address] Phone: [phone] Fax: [phone]    Has the prescription been filled recently? Yes  Is the patient out of the medication? No, 1 left   Has the patient been seen for an appointment in the last year OR does the patient have an upcoming appointment? Yes  Can we respond through MyChart? No  Agent: Please be advised that Rx refills may take up to 3 business days. We ask that you follow-up with your pharmacy.

## 2023-04-03 DIAGNOSIS — I5032 Chronic diastolic (congestive) heart failure: Secondary | ICD-10-CM | POA: Diagnosis not present

## 2023-04-03 DIAGNOSIS — S43012D Anterior subluxation of left humerus, subsequent encounter: Secondary | ICD-10-CM | POA: Diagnosis not present

## 2023-04-03 DIAGNOSIS — M0680 Other specified rheumatoid arthritis, unspecified site: Secondary | ICD-10-CM | POA: Diagnosis not present

## 2023-04-03 DIAGNOSIS — I130 Hypertensive heart and chronic kidney disease with heart failure and stage 1 through stage 4 chronic kidney disease, or unspecified chronic kidney disease: Secondary | ICD-10-CM | POA: Diagnosis not present

## 2023-04-03 DIAGNOSIS — E1122 Type 2 diabetes mellitus with diabetic chronic kidney disease: Secondary | ICD-10-CM | POA: Diagnosis not present

## 2023-04-03 DIAGNOSIS — N1832 Chronic kidney disease, stage 3b: Secondary | ICD-10-CM | POA: Diagnosis not present

## 2023-04-09 DIAGNOSIS — I130 Hypertensive heart and chronic kidney disease with heart failure and stage 1 through stage 4 chronic kidney disease, or unspecified chronic kidney disease: Secondary | ICD-10-CM | POA: Diagnosis not present

## 2023-04-09 DIAGNOSIS — N1832 Chronic kidney disease, stage 3b: Secondary | ICD-10-CM | POA: Diagnosis not present

## 2023-04-09 DIAGNOSIS — I5032 Chronic diastolic (congestive) heart failure: Secondary | ICD-10-CM | POA: Diagnosis not present

## 2023-04-09 DIAGNOSIS — E1122 Type 2 diabetes mellitus with diabetic chronic kidney disease: Secondary | ICD-10-CM | POA: Diagnosis not present

## 2023-04-09 DIAGNOSIS — M0680 Other specified rheumatoid arthritis, unspecified site: Secondary | ICD-10-CM | POA: Diagnosis not present

## 2023-04-09 DIAGNOSIS — S43012D Anterior subluxation of left humerus, subsequent encounter: Secondary | ICD-10-CM | POA: Diagnosis not present

## 2023-04-13 DIAGNOSIS — S42122D Displaced fracture of acromial process, left shoulder, subsequent encounter for fracture with routine healing: Secondary | ICD-10-CM | POA: Diagnosis not present

## 2023-04-13 DIAGNOSIS — Z96612 Presence of left artificial shoulder joint: Secondary | ICD-10-CM | POA: Diagnosis not present

## 2023-04-13 DIAGNOSIS — S42121G Displaced fracture of acromial process, right shoulder, subsequent encounter for fracture with delayed healing: Secondary | ICD-10-CM | POA: Diagnosis not present

## 2023-04-17 ENCOUNTER — Other Ambulatory Visit: Payer: Self-pay | Admitting: Internal Medicine

## 2023-04-17 ENCOUNTER — Encounter: Payer: Medicare Other | Admitting: Internal Medicine

## 2023-04-17 DIAGNOSIS — M0579 Rheumatoid arthritis with rheumatoid factor of multiple sites without organ or systems involvement: Secondary | ICD-10-CM | POA: Diagnosis not present

## 2023-04-30 ENCOUNTER — Other Ambulatory Visit: Payer: Self-pay | Admitting: Internal Medicine

## 2023-04-30 MED ORDER — OXYCODONE HCL 10 MG PO TABS
5.0000 mg | ORAL_TABLET | Freq: Three times a day (TID) | ORAL | 0 refills | Status: DC | PRN
Start: 2023-04-30 — End: 2023-05-28

## 2023-04-30 NOTE — Telephone Encounter (Signed)
 Spoke to pt, pt states her daughter will call back to schedule.

## 2023-04-30 NOTE — Telephone Encounter (Signed)
 Name of Medication: Oxycodone 10mg  Name of Pharmacy: [facility] [address] [place] or Written Date and Quantity: 04-02-23 #90 Last Office Visit and Type: 01-09-23 Next Office Visit and Type: No Future OV Last Controlled Substance Agreement Date: 03-30-22 Last UDS: 03-30-22

## 2023-04-30 NOTE — Telephone Encounter (Signed)
 Copied from CRM [id number]. Topic: Clinical - Medication Refill >> Apr 30, 2023  8:07 AM [name] wrote: Most Recent Primary Care Visit:  Provider: Tillman Abide I  Department: [facility]  Visit Type: OFFICE VISIT  Date: 01/09/2023  Medication: Oxycodone HCl 10 MG  Has the patient contacted their pharmacy? No (Agent: If no, request that the patient contact the pharmacy for the refill. If patient does not wish to contact the pharmacy document the reason why and proceed with request.) (Agent: If yes, when and what did the pharmacy advise?)  Is this the correct pharmacy for this prescription? Yes If no, delete pharmacy and type the correct one.  This is the patient's preferred pharmacy:  [id number] - [place], [place] - [address] AT [facility] [address] [facility] [address] [place] [address] Phone: [phone] Fax: [phone]   Has the prescription been filled recently? No  Is the patient out of the medication? Yes  Has the patient been seen for an appointment in the last year OR does the patient have an upcoming appointment? Yes  Can we respond through MyChart? No  Agent: Please be advised that Rx refills may take up to 3 business days. We ask that you follow-up with your pharmacy.

## 2023-05-15 ENCOUNTER — Telehealth: Payer: Self-pay | Admitting: Cardiovascular Disease

## 2023-05-15 DIAGNOSIS — M0579 Rheumatoid arthritis with rheumatoid factor of multiple sites without organ or systems involvement: Secondary | ICD-10-CM | POA: Diagnosis not present

## 2023-05-15 NOTE — Telephone Encounter (Signed)
   Pre-operative Risk Assessment    Patient Name: Tina Pacheco  DOB: 1950/06/02 MRN: [id number]   Date of last office visit: unknown Date of next office visit: unknown  Request for Surgical Clearance    Procedure:  Dental Extraction - Amount of Teeth to be Pulled:  multi  Date of Surgery:  Clearance TBD                                Surgeon:  Dr. Theodis Shove Surgeon's Group or Practice Name:  [facility]  Phone number:  [phone] Fax number:  [phone]   Type of Clearance Requested:   - Medical    Type of Anesthesia:  General    Additional requests/questions:    SignedShawna Orleans   05/15/2023, 8:10 AM

## 2023-05-15 NOTE — Telephone Encounter (Signed)
 Preoperative team, unfortunately we are not able to provide blanket coverage for dental procedures.  Please contact requesting office and let them know that we will need to know specific details surrounding surgery in order to provide recommendations from a cardiac standpoint.  Thank you for your help.  Thomasene Ripple. Jalon Blackwelder NP-C     05/15/2023, 11:13 AM [facility] [address] Office [phone] Fax [phone]

## 2023-05-15 NOTE — Telephone Encounter (Signed)
 Called requesting office patient is getting surgical extractions on teeth numbers 4,6-14 and 20-28 a total of 19

## 2023-05-15 NOTE — Telephone Encounter (Signed)
 Patient with diagnosis of atrial fibrillation on Eliquis for anticoagulation.    Procedure:  Dental Extraction - Amount of Teeth to be Pulled:  multi   Date of Surgery:  Clearance TBD   CHA2DS2-VASc Score = 5   This indicates a 7.2% annual risk of stroke. The patient's score is based upon: CHF History: 1 HTN History: 1 Diabetes History: 0 Stroke History: 0 Vascular Disease History: 1 Age Score: 1 Gender Score: 1    CrCl 67 Platelet count 242  Patient does not require pre-op antibiotics for dental procedure.  Per office protocol, patient can hold Eliquis for 2 days prior to procedure.   Patient will not need bridging with Lovenox (enoxaparin) around procedure.  **This guidance is not considered finalized until pre-operative APP has relayed final recommendations.**

## 2023-05-15 NOTE — Telephone Encounter (Signed)
 Tina Pacheco, Tina Pacheco was recently seen by you in clinic on 03/27/2023.  She presented with her home health aide.  She denied cardiac complaints.  She did note some bruising on her upper extremities.  Her weight was down 7 pounds.  Follow-up in 6 months was planned.  Would you be able to comment on her risk for upcoming dental extraction?  Thank you for your help.  Please direct your response to CV DIV preop pool.  Thomasene Ripple. Tina Omara NP-C     05/15/2023, 2:41 PM [facility] [address] Office [phone] Fax [phone]

## 2023-05-15 NOTE — Telephone Encounter (Signed)
 This is a 73 year old female with history of nonobstructive CAD by LHC in 05/2020, HFpEF, PAF on Eliquis, CKD stage III, HTN, HLD, anemia, COPD, rheumatoid arthritis, fibromyalgia, falls, morbid obesity, H. pylori, and OSA.  She was most recently seen in the office on 03/27/2023 and without symptoms of angina or cardiac decompensation.  Her weight was down 7 pounds when compared to revisit in 07/2022.  She was euvolemic and well compensated.  From a cardiac perspective, patient would be low risk for multiple dental extractions and would not require further cardiac testing to mitigate risk at this time.  Hold apixaban as directed by pharmacy team.

## 2023-05-16 NOTE — Telephone Encounter (Signed)
 I will fax to requesting office.

## 2023-05-16 NOTE — Telephone Encounter (Signed)
     Primary Cardiologist: Julien Nordmann, MD  Chart reviewed as part of pre-operative protocol coverage. Given past medical history and time since last visit, based on ACC/AHA guidelines, Tina Pacheco would be at acceptable risk for the planned procedure without further cardiovascular testing.   She was recently seen in the cardiology clinic.  She is considered low risk for her upcoming procedure.  CHA2DS2-VASc Score = 5   This indicates a 7.2% annual risk of stroke. The patient's score is based upon: CHF History: 1 HTN History: 1 Diabetes History: 0 Stroke History: 0 Vascular Disease History: 1 Age Score: 1 Gender Score: 1     CrCl 67 Platelet count 242   Patient does not require pre-op antibiotics for dental procedure.   Per office protocol, patient can hold Eliquis for 2 days prior to procedure.   Patient will not need bridging with Lovenox (enoxaparin) around procedure.  I will route this recommendation to the requesting party via Epic fax function and remove from pre-op pool.  Please call with questions.  Thomasene Ripple. Kate Sweetman NP-C     05/16/2023, 7:09 AM [facility] [address] Office [phone] Fax [phone]

## 2023-05-18 ENCOUNTER — Ambulatory Visit (INDEPENDENT_AMBULATORY_CARE_PROVIDER_SITE_OTHER): Admitting: Internal Medicine

## 2023-05-18 ENCOUNTER — Other Ambulatory Visit: Payer: Self-pay

## 2023-05-18 ENCOUNTER — Ambulatory Visit (INDEPENDENT_AMBULATORY_CARE_PROVIDER_SITE_OTHER)
Admission: RE | Admit: 2023-05-18 | Discharge: 2023-05-18 | Disposition: A | Discharge status: Home / Self Care | Source: Ambulatory Visit | Attending: Internal Medicine | Admitting: Internal Medicine

## 2023-05-18 ENCOUNTER — Emergency Department
Admission: EM | Admit: 2023-05-18 | Discharge: 2023-05-18 | Disposition: A | Discharge status: Home / Self Care | Attending: Emergency Medicine | Admitting: Emergency Medicine

## 2023-05-18 VITALS — BP 122/80 | HR 57 | Temp 98.6°F | Ht 63.0 in | Wt 237.0 lb

## 2023-05-18 DIAGNOSIS — S4991XA Unspecified injury of right shoulder and upper arm, initial encounter: Secondary | ICD-10-CM | POA: Diagnosis present

## 2023-05-18 DIAGNOSIS — G894 Chronic pain syndrome: Secondary | ICD-10-CM

## 2023-05-18 DIAGNOSIS — F1120 Opioid dependence, uncomplicated: Secondary | ICD-10-CM

## 2023-05-18 DIAGNOSIS — S43004A Unspecified dislocation of right shoulder joint, initial encounter: Secondary | ICD-10-CM

## 2023-05-18 DIAGNOSIS — M25511 Pain in right shoulder: Secondary | ICD-10-CM

## 2023-05-18 DIAGNOSIS — T84028A Dislocation of other internal joint prosthesis, initial encounter: Secondary | ICD-10-CM | POA: Diagnosis not present

## 2023-05-18 DIAGNOSIS — X58XXXA Exposure to other specified factors, initial encounter: Secondary | ICD-10-CM | POA: Insufficient documentation

## 2023-05-18 DIAGNOSIS — Z96611 Presence of right artificial shoulder joint: Secondary | ICD-10-CM | POA: Diagnosis not present

## 2023-05-18 DIAGNOSIS — S43014A Anterior dislocation of right humerus, initial encounter: Secondary | ICD-10-CM | POA: Diagnosis not present

## 2023-05-18 DIAGNOSIS — G8929 Other chronic pain: Secondary | ICD-10-CM

## 2023-05-18 DIAGNOSIS — Z471 Aftercare following joint replacement surgery: Secondary | ICD-10-CM | POA: Diagnosis not present

## 2023-05-18 NOTE — Assessment & Plan Note (Signed)
 PDMP reviewed No concerns

## 2023-05-18 NOTE — Assessment & Plan Note (Signed)
 Has had surgery and past dislocations Feels dislocated again--will check x-ray

## 2023-05-18 NOTE — ED Provider Notes (Signed)
 [facility] Provider Note    Event Date/Time   First MD Initiated Contact with Patient 05/18/23 1336     (approximate)   History   Dislocation   HPI  Tina Pacheco is a 73 y.o. female with prior right shoulder arthroplasty who comes in with concerns for dislocation.  Patient states that it became dislocated 4 weeks ago.  She stated that she did not want to come to be seen for because sometimes it resolves but she has not been unable to have full range of motion of it since then has had pain.  Eventually she did go to her primary care doctor who got an x-ray that shows an inferior dislocation of the right shoulder arthroplasty.  She reports that they typically have a very hard time getting the shoulder back into place and that she is required admission for reduction. Didn't hit her head   I reviewed the note from 10/02/2022 where patient had a dislocation and they patient underwent propofol, ketamine and was unable to reduce the left side   Physical Exam   Triage Vital Signs: ED Triage Vitals  Encounter Vitals Group     BP 05/18/23 1325 (!) 159/64     Systolic BP Percentile --      Diastolic BP Percentile --      Pulse Rate 05/18/23 1325 63     Resp 05/18/23 1325 18     Temp 05/18/23 1325 98.3 F (36.8 C)     Temp Source 05/18/23 1325 Oral     SpO2 05/18/23 1325 97 %     Weight --      Height --      Head Circumference --      Peak Flow --      Pain Score 05/18/23 1323 10     Pain Loc --      Pain Education --      Exclude from Growth Chart --     Most recent vital signs: Vitals:   05/18/23 1325  BP: (!) 159/64  Pulse: 63  Resp: 18  Temp: 98.3 F (36.8 C)  SpO2: 97%     General: Awake, no distress.  CV:  Good peripheral perfusion.  Resp:  Normal effort.  Abd:  No distention.  Other:  Pain of the right shoulder.  Patient is neurovascularly intact able to move her hand. Cap refil normal. Nerves all intact    ED Results / Procedures  / Treatments   Labs (all labs ordered are listed, but only abnormal results are displayed) Labs Reviewed - No data to display     RADIOLOGY I have reviewed the xray personally and interpreted positive inferior shoulder dislocation   PROCEDURES:  Critical Care performed: No  Procedures   MEDICATIONS ORDERED IN ED: Medications - No data to display   IMPRESSION / MDM / ASSESSMENT AND PLAN / ED COURSE  I reviewed the triage vital signs and the nursing notes.   Patient's presentation is most consistent with acute, uncomplicated illness.   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  Patient is neurovascularly intact.  Discussed with Dr. Hyacinth Meeker but he is not on-call unassigned and not [facility] clinic.  He would recommend following up outpatient with [facility] clinic given it has been over a month will most likely need surgery to reduce this.  Given Dr. Ernest Pine I did page them-he stated that the should be done under OR and also do not recommend  trying to reduce your emergency room.  They will help coordinate getting patient seen early next week with Dr. Jamelle Rushing he or one of the Pas  .  Patient expressed understanding and daughters and understanding as well.  They feel comfortable with discharge home.  They already have a prescription for oxycodone 20 mg    FINAL CLINICAL IMPRESSION(S) / ED DIAGNOSES   Final diagnoses:  Dislocation of right shoulder joint, initial encounter     Rx / DC Orders   ED Discharge Orders     None        Note:  This document was prepared using Dragon voice recognition software and may include unintentional dictation errors.   Concha Se, MD 05/18/23 [phone]

## 2023-05-18 NOTE — Discharge Instructions (Signed)
 We have opted to not be able to reduce this in the emergency room given it has been out for over a month, hardware inside of it.  Call orthopedics today to make a follow-up appointment for early next week and return to the ER for worsening symptoms or any other concerns

## 2023-05-18 NOTE — ED Triage Notes (Addendum)
 Pt reports a dislocated right shoulder x4 weeks, shoulder was xrayed this morning. Pt has limited ROM, CMS intact. Pt has a history of ball and socket replacement x2 and history of dislocations. Pt AOX4, NAD noted. Xray done today at GP reading a dislocation of the right shoulder- see records.

## 2023-05-18 NOTE — Assessment & Plan Note (Signed)
 Chronic pain now out of control--mainly from right shoulder

## 2023-05-18 NOTE — Assessment & Plan Note (Signed)
 Causing her increased pain Daughter will bring her to ER to try to have the reduce the dislocation

## 2023-05-18 NOTE — Progress Notes (Signed)
 Subjective:    Patient ID: Tina Pacheco, female    DOB: 1950/11/19, 72 y.o.   MRN: [id number]  HPI Here with daughter for follow up of chronic pain and narcotic dependence  Having a lot of pain Back pain and shoulders Pain into head Can't use right arm at all Left arm limited--but not painful Oxycodone not really helping now---unless she takes 2 at a time (lasts for 1 hour--but doesn't feel well) All this goes back to the shoulder replacements Has been seeing Dr Lorella Nimrod internal fractures (scapular?) They will put her in sling--but then she can't walk due to lack of balance Does have swelling of the right shoulder  Tramadol doesn't help  Having fluid retention Despite the torsemide Will have SOB at night---she thinks it is from slumping forward Has to sleep in chair No chest pain  Current Outpatient Medications on File Prior to Visit  Medication Sig Dispense Refill   amiodarone (PACERONE) 200 MG tablet TAKE 1 TABLET(200 MG) BY MOUTH DAILY 90 tablet 0   apixaban (ELIQUIS) 5 MG TABS tablet Take 1 tablet (5 mg total) by mouth 2 (two) times daily. 180 tablet 1   atorvastatin (LIPITOR) 40 MG tablet TAKE 1 TABLET(40 MG) BY MOUTH DAILY 90 tablet 3   Certolizumab Pegol (CIMZIA Alderson) Inject into the skin. Patient states taking 1 x per month     ketoconazole (NIZORAL) 2 % cream Apply 1 Application topically as needed.     leflunomide (ARAVA) 20 MG tablet Take 20 mg by mouth daily.     metolazone (ZAROXOLYN) 2.5 MG tablet TAKE 1 TABLET BY MOUTH DAILY AS NEEDED. MAY GIVE 1 TABLET AS NEEDED FOR WEIGHT GAIN OF 5 LB IN 1 WEEK 30 tablet 5   metoprolol succinate (TOPROL-XL) 50 MG 24 hr tablet TAKE 1 TABLET BY MOUTH EVERY DAY WITH OR IMMEDIATELY FOLLOWING A MEAL (Patient taking differently: Take 50 mg by mouth at bedtime. TAKE 1 TABLET BY MOUTH EVERY DAY WITH OR IMMEDIATELY FOLLOWING A MEAL) 90 tablet 3   montelukast (SINGULAIR) 10 MG tablet TAKE 1 TABLET BY MOUTH AT BEDTIME 90 tablet 3    nitroGLYCERIN (NITROSTAT) 0.4 MG SL tablet Place 1 tablet (0.4 mg total) under the tongue every 5 (five) minutes as needed for chest pain. 20 tablet 12   nystatin (MYCOSTATIN/NYSTOP) powder Apply 1 application topically 3 (three) times daily as needed. 60 g 0   Oxycodone HCl 10 MG TABS Take 0.5-1 tablets (5-10 mg total) by mouth 3 (three) times daily as needed. 90 tablet 0   polyethylene glycol (MIRALAX / GLYCOLAX) packet Take 17 g by mouth daily as needed for mild constipation. 14 each 0   potassium chloride SA (KLOR-CON M) 20 MEQ tablet Take 1 tablet (20 mEq total) by mouth daily. 90 tablet 3   predniSONE (DELTASONE) 10 MG tablet Take 5 mg by mouth.     senna-docusate (SENOKOT-S) 8.6-50 MG tablet Take 2 tablets by mouth at bedtime.     spironolactone (ALDACTONE) 25 MG tablet TAKE 1 TABLET(25 MG) BY MOUTH DAILY 90 tablet 3   terazosin (HYTRIN) 10 MG capsule TAKE 1 CAPSULE(10 MG) BY MOUTH AT BEDTIME 90 capsule 3   torsemide (DEMADEX) 20 MG tablet Take 2 tablets (40 mg total) by mouth daily. 60 tablet 5   omeprazole (PRILOSEC) 40 MG capsule TAKE 1 CAPSULE(40 MG) BY MOUTH AT BEDTIME (Patient not taking: Reported on 05/18/2023) 90 capsule 2   No current facility-administered medications on file prior to visit.  Allergies  Allergen Reactions   Fluoxetine Other (See Comments)    Headache, shaking, sleep issues Headache, shaking, sleep issues   Codeine     Nausea and vomiting/only when taking too much    Past Medical History:  Diagnosis Date   (HFpEF) heart failure with preserved ejection fraction (HCC)    a.) TTE 08/10/2014: EF 55-60%, mild MAC, mild LAE, mild MR, PASP 38; b.) TTE 03/31/2019: EF 55-60%, mild LVH, mild-mod LAW, mod MAC, mild-mod AoV sclerosis, triv PR, G1DD; c.) TTE 05/29/2020: EF 60-65%, mild LVH, mod LAE, G1DD   Anemia    Aortic atherosclerosis (HCC)    Asthma    Basal cell carcinoma    CAD (coronary artery disease)    a.) LHC 05/24/2010 - nonobstructive CAD - med mgmt;  b.) LHC 06/04/2020: 30% dLAD, 40% o-mLAD - med mgmt   Chronic, continuous use of opioids    Claustrophobia    COPD (chronic obstructive pulmonary disease) (HCC)    DDD (degenerative disc disease), lumbosacral    Diuretic-induced hypokalemia    Dyslipidemia    Fibromyalgia    Frequent falls    GERD (gastroesophageal reflux disease)    Gouty arthropathy    Hemihypertrophy    History of bilateral cataract extraction 2018   Hyperplastic colonic polyp 2003   Hypertension    Long term current use of amiodarone    Long term current use of anticoagulant    a.) apixaban   Long term current use of immunosuppressive drug    a.) certolizumab pegol  + predisone for RA diagnosis   MDD (major depressive disorder)    Migraines    Morbid obesity (HCC)    Nontraumatic complete tear of rotator cuff, left    OSA (obstructive sleep apnea)    a.) non-complaint with prescribed nocturnal PAP therapy   Osteoarthritis    PAF (paroxysmal atrial fibrillation) (HCC)    a.) CHA2DS2-VASc = 5 (age, sex, CHF, HTN, vascular disease history); b.) rate and rhythm maintained on oral amiodarone + metoprolol succinate; chronically anticoagulated with standard dose apixaban   Pneumonia    PONV (postoperative nausea and vomiting)    Prediabetes    Rheumatoid arthritis (HCC)    a.) on certolizumab pegol + predisone   Stage 3 chronic kidney disease (HCC)     Past Surgical History:  Procedure Laterality Date   ABDOMINAL HYSTERECTOMY     APPLICATION OF WOUND VAC Right 08/09/2017   Procedure: APPLICATION OF WOUND VAC;  Surgeon: Recardo Evangelist, DPM;  Location: [facility];  Service: Podiatry;  Laterality: Right;   CARDIAC CATHETERIZATION  05/24/2010   nonobstructive CAD   CATARACT EXTRACTION W/ INTRAOCULAR LENS IMPLANT Right 06/12/2016   Dr. Mia Creek   CATARACT EXTRACTION W/ INTRAOCULAR LENS IMPLANT Left 06/26/2016   Dr. Mia Creek   CESAREAN SECTION     CHOLECYSTECTOMY     COLONOSCOPY  06/12/2011    Procedure: COLONOSCOPY;  Surgeon: Charna Elizabeth, MD;  Location: [facility];  Service: Endoscopy;  Laterality: N/A;   COLONOSCOPY N/A 03/17/2013   Procedure: COLONOSCOPY;  Surgeon: Charna Elizabeth, MD;  Location: [facility];  Service: Endoscopy;  Laterality: N/A;   FOOT ARTHRODESIS Right 07/13/2017   Procedure: ARTHRODESIS FOOT-MULTI.FUSIONS (6 JOINTS);  Surgeon: Recardo Evangelist, DPM;  Location: [facility];  Service: Podiatry;  Laterality: Right;   IRRIGATION AND DEBRIDEMENT FOOT Right 08/09/2017   Procedure: IRRIGATION AND DEBRIDEMENT FOOT;  Surgeon: Recardo Evangelist, DPM;  Location: [facility];  Service: Podiatry;  Laterality: Right;  KNEE ARTHROSCOPY Bilateral    bilateral   LEFT HEART CATH AND CORONARY ANGIOGRAPHY N/A 06/04/2020   Procedure: LEFT HEART CATH AND CORONARY ANGIOGRAPHY;  Surgeon: Iran Ouch, MD;  Location: [facility];  Service: Cardiovascular;  Laterality: N/A;   REVERSE SHOULDER ARTHROPLASTY Right 11/16/2020   Procedure: REVERSE SHOULDER ARTHROPLASTY;  Surgeon: Christena Flake, MD;  Location: [facility];  Service: Orthopedics;  Laterality: Right;   REVERSE SHOULDER ARTHROPLASTY Left 10/03/2022   Procedure: REVERSE SHOULDER ARTHROPLASTY;  Surgeon: Christena Flake, MD;  Location: [facility];  Service: Orthopedics;  Laterality: Left;   SHOULDER CLOSED REDUCTION Right 11/11/2020   Procedure: CLOSED REDUCTION SHOULDER;  Surgeon: Christena Flake, MD;  Location: [facility];  Service: Orthopedics;  Laterality: Right;   SHOULDER CLOSED REDUCTION Left 10/02/2022   Procedure: CLOSED REDUCTION SHOULDER;  Surgeon: Reinaldo Berber, MD;  Location: [facility];  Service: Orthopedics;  Laterality: Left;   SHOULDER INJECTION Right 11/11/2020   Procedure: SHOULDER INJECTION;  Surgeon: Christena Flake, MD;  Location: [facility];  Service: Orthopedics;  Laterality: Right;   SINUSOTOMY     TOOTH EXTRACTION  12/2016   TOTAL SHOULDER REVISION Right 06/27/2022   Procedure: TOTAL SHOULDER REVISION;  Surgeon:  Christena Flake, MD;  Location: [facility];  Service: Orthopedics;  Laterality: Right;    Family History  Problem Relation Age of Onset   Tuberculosis Mother    Parkinsonism Mother    Emphysema Father        smoked   Arthritis Father    Cancer Sister    Diabetes type II Sister    Breast cancer Sister    Tuberculosis Sister    Breast cancer Maternal Aunt    Colon cancer Neg Hx    Esophageal cancer Neg Hx    Pancreatic cancer Neg Hx    Stomach cancer Neg Hx     Social History   Socioeconomic History   Marital status: Married    Spouse name: Rexford Maus   Number of children: 1   Years of education: Not on file   Highest education level: Bachelor's degree (e.g., BA, AB, BS)  Occupational History   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  Tobacco Use   Smoking status: Never    Passive exposure: Past   Smokeless tobacco: Never  Vaping Use   Vaping status: Never Used  Substance and Sexual Activity   Alcohol use: No   Drug use: No   Sexual activity: Not Currently  Other Topics Concern   Not on file  Social History Narrative   No living will   Daughter should be decision maker   Would accept resuscitation attempts--but no prolonged ventilator or tube feeds   Social Drivers of Health   Financial Resource Strain: Low Risk  (05/18/2023)   Overall Financial Resource Strain (CARDIA)    Difficulty of Paying Living Expenses: Not hard at all  Food Insecurity: No Food Insecurity (05/18/2023)   Hunger Vital Sign    Worried About Running Out of Food in the Last Year: Never true    Ran Out of Food in the Last Year: Never true  Transportation Needs: No Transportation Needs (05/18/2023)   PRAPARE - Administrator, Civil Service (Medical): No    Lack of Transportation (Non-Medical): No  Physical Activity: Unknown (05/18/2023)   Exercise Vital Sign    Days of Exercise per Week: 0 days    Minutes of  Exercise per Session: Not on file  Stress: Stress  Concern Present (05/18/2023)   [employer] of Occupational Health - Occupational Stress Questionnaire    Feeling of Stress : Very much  Social Connections: Moderately Isolated (05/18/2023)   Social Connection and Isolation Panel [NHANES]    Frequency of Communication with Friends and Family: Twice a week    Frequency of Social Gatherings with Friends and Family: More than three times a week    Attends Religious Services: Never    Database administrator or Organizations: No    Attends Engineer, structural: Not on file    Marital Status: Married  Catering manager Violence: Not At Risk (10/02/2022)   Humiliation, Afraid, Rape, and Kick questionnaire    Fear of Current or Ex-Partner: No    Emotionally Abused: No    Physically Abused: No    Sexually Abused: No   Review of Systems     Objective:   Physical Exam Musculoskeletal:     Comments: Severe pain with any movement of right arm May have anterior dislocation at shoulder Will check x-ray            Assessment & Plan:

## 2023-05-21 ENCOUNTER — Encounter: Payer: Self-pay | Admitting: Internal Medicine

## 2023-05-21 DIAGNOSIS — S42121G Displaced fracture of acromial process, right shoulder, subsequent encounter for fracture with delayed healing: Secondary | ICD-10-CM | POA: Diagnosis not present

## 2023-05-21 DIAGNOSIS — T84028D Dislocation of other internal joint prosthesis, subsequent encounter: Secondary | ICD-10-CM | POA: Diagnosis not present

## 2023-05-21 DIAGNOSIS — Z96611 Presence of right artificial shoulder joint: Secondary | ICD-10-CM | POA: Diagnosis not present

## 2023-05-21 LAB — DRUG MONITORING, PANEL 8 WITH CONFIRMATION, URINE
6 Acetylmorphine: NEGATIVE ng/mL (ref <10)
Alcohol Metabolites: NEGATIVE ng/mL (ref <500)
Amphetamines: NEGATIVE ng/mL (ref <500)
Benzodiazepines: NEGATIVE ng/mL (ref <100)
Buprenorphine, Urine: NEGATIVE ng/mL (ref <5)
Cocaine Metabolite: NEGATIVE ng/mL (ref <150)
Codeine: NEGATIVE ng/mL (ref <50)
Creatinine: 65.1 mg/dL (ref >=20.0)
Hydrocodone: NEGATIVE ng/mL (ref <50)
Hydromorphone: NEGATIVE ng/mL (ref <50)
MDMA: NEGATIVE ng/mL (ref <500)
Marijuana Metabolite: NEGATIVE ng/mL (ref <20)
Morphine: NEGATIVE ng/mL (ref <50)
Norhydrocodone: NEGATIVE ng/mL (ref <50)
Noroxycodone: 500 ng/mL — ABNORMAL HIGH (ref <50)
Opiates: NEGATIVE ng/mL (ref <100)
Oxidant: NEGATIVE ug/mL (ref <200)
Oxycodone: 107 ng/mL — ABNORMAL HIGH (ref <50)
Oxycodone: POSITIVE ng/mL — AB (ref <100)
Oxymorphone: 974 ng/mL — ABNORMAL HIGH (ref <50)
pH: 7.8 (ref 4.5–9.0)

## 2023-05-21 LAB — DM TEMPLATE

## 2023-05-28 ENCOUNTER — Other Ambulatory Visit: Payer: Self-pay | Admitting: Internal Medicine

## 2023-05-28 NOTE — Telephone Encounter (Signed)
 Name of Medication: Oxycodone 10mg  Name of Pharmacy: [facility] [address] Last [place] or Written Date and Quantity: 04-30-23 #90 Last Office Visit and Type: 04-30-23 Next Office Visit and Type: 08-21-23 Last Controlled Substance Agreement Date: 05-18-23 Last UDS: 05-18-23

## 2023-05-28 NOTE — Telephone Encounter (Signed)
 Copied from CRM [id number]. Topic: Clinical - Medication Refill >> May 28, 2023 11:25 AM Pascal Lux wrote: Most Recent Primary Care Visit:  Provider: Tillman Abide I  Department: [facility]  Visit Type: OFFICE VISIT  Date: 05/18/2023  Medication: Oxycodone HCl 10 MG TABS [[id number]]  Has the patient contacted their pharmacy? No (Agent: If no, request that the patient contact the pharmacy for the refill. If patient does not wish to contact the pharmacy document the reason why and proceed with request.) (Agent: If yes, when and what did the pharmacy advise?)  Is this the correct pharmacy for this prescription? Yes If no, delete pharmacy and type the correct one.  This is the patient's preferred pharmacy:  [id number] - [place], [place] - [address] [facility] [address] [facility] [address] [place] [address] Phone: [phone] Fax: [phone]     Has the prescription been filled recently? Yes  Is the patient out of the medication? Yes  Has the patient been seen for an appointment in the last year OR does the patient have an upcoming appointment? Yes  Can we respond through MyChart? No  Agent: Please be advised that Rx refills may take up to 3 business days. We ask that you follow-up with your pharmacy.

## 2023-05-29 MED ORDER — OXYCODONE HCL 10 MG PO TABS
5.0000 mg | ORAL_TABLET | Freq: Three times a day (TID) | ORAL | 0 refills | Status: DC | PRN
Start: 2023-05-29 — End: 2023-06-25

## 2023-06-05 ENCOUNTER — Other Ambulatory Visit: Payer: Self-pay | Admitting: Cardiovascular Disease

## 2023-06-05 DIAGNOSIS — I480 Paroxysmal atrial fibrillation: Secondary | ICD-10-CM

## 2023-06-11 ENCOUNTER — Telehealth: Payer: Self-pay

## 2023-06-11 NOTE — Telephone Encounter (Signed)
 Faxed form back that was already sent to them and scanned in her chart, already.

## 2023-06-11 NOTE — Telephone Encounter (Signed)
 Copied from CRM [id number]. Topic: Medical Record Request - Other >> Jun 11, 2023 12:28 PM Isabell A wrote: Reason for CRM: Edwina Gram from [facility] requesting for clearance form from 3/17 to be faxed back.  Fax no: [phone]

## 2023-06-12 ENCOUNTER — Ambulatory Visit

## 2023-06-12 DIAGNOSIS — M0579 Rheumatoid arthritis with rheumatoid factor of multiple sites without organ or systems involvement: Secondary | ICD-10-CM | POA: Diagnosis not present

## 2023-06-13 ENCOUNTER — Telehealth: Payer: Self-pay | Admitting: *Deleted

## 2023-06-13 NOTE — Telephone Encounter (Signed)
 Copied from CRM [id number]. Topic: General - Other >> Jun 12, 2023 12:38 PM Whitney O wrote: Reason for CRM: we sent over a medical clearance for dr wert to sign thats the last thing we are waiting for to proceed surgery. Relayed information that I do see the form but they have not received anything back  [phone] fax number to send back the medical clearance form  Sent to our office in error.  This patient has never been seen in our office by any of our providers.  Closing encounter.

## 2023-06-21 ENCOUNTER — Telehealth: Payer: Self-pay

## 2023-06-21 NOTE — Telephone Encounter (Signed)
 Copied from CRM [id number]. Topic: Medical Record Request - Records Request >> Jun 20, 2023  2:49 PM Isabell A wrote: Reason for CRM: Edwina Gram from [facility] requesting for clearance form from 3/17 to be faxed back.  Fax no: [phone]   Advised it has been sent before, Edwina Gram requesting for notes from 2017 to be resent again.   Routing to [place] for surgical clearance.

## 2023-06-21 NOTE — Telephone Encounter (Signed)
 Called and spoke with Tina Pacheco at [facility] Phone: [phone]. I advised her that this is not a pulm pt and she has no planned appt here. We can not clear her for surgery. Tina Pacheco verbalized understanding. Nothing further needed.

## 2023-06-22 DIAGNOSIS — T84028A Dislocation of other internal joint prosthesis, initial encounter: Secondary | ICD-10-CM | POA: Diagnosis not present

## 2023-06-22 DIAGNOSIS — T84028D Dislocation of other internal joint prosthesis, subsequent encounter: Secondary | ICD-10-CM | POA: Diagnosis not present

## 2023-06-25 ENCOUNTER — Other Ambulatory Visit: Payer: Self-pay | Admitting: Internal Medicine

## 2023-06-25 NOTE — Telephone Encounter (Signed)
 Copied from CRM [id number]. Topic: Clinical - Medication Refill >> Jun 25, 2023  3:59 PM Howard Macho wrote: Most Recent Primary Care Visit:  Provider: Curt Dover I  Department: [facility]  Visit Type: OFFICE VISIT  Date: 05/18/2023  Medication: Oxycodone  HCl 10 MG TABS  Has the patient contacted their pharmacy? Yes (Agent: If no, request that the patient contact the pharmacy for the refill. If patient does not wish to contact the pharmacy document the reason why and proceed with request.) (Agent: If yes, when and what did the pharmacy advise?)  Is this the correct pharmacy for this prescription? Yes If no, delete pharmacy and type the correct one.  This is the patient's preferred pharmacy:  [id number] - [place], [place] - [address] AT [facility] [address] [place] [address] Phone: [phone] Fax: [phone]  Has the prescription been filled recently? No  Is the patient out of the medication? No  Has the patient been seen for an appointment in the last year OR does the patient have an upcoming appointment? Yes  Can we respond through MyChart? Yes  Agent: Please be advised that Rx refills may take up to 3 business days. We ask that you follow-up with your pharmacy.

## 2023-06-26 NOTE — Telephone Encounter (Signed)
 Name of Medication: Oxycodone  10mg  Name of Pharmacy: [facility] [address] [place] or Written Date and Quantity: 05-29-23 #90 Last Office Visit and Type: 04-30-23 Next Office Visit and Type: 08-21-23 Last Controlled Substance Agreement Date: 05-18-23 Last UDS: 05-18-23

## 2023-06-26 NOTE — Telephone Encounter (Signed)
 Patient is calling in regarding her medication for her norco medication  she would like a call back regarding this

## 2023-06-27 MED ORDER — OXYCODONE HCL 10 MG PO TABS: 5.0000 mg | ORAL_TABLET | Freq: Three times a day (TID) | ORAL | 0 refills | Status: DC | PRN

## 2023-06-29 ENCOUNTER — Other Ambulatory Visit: Payer: Self-pay | Admitting: Internal Medicine

## 2023-06-29 DIAGNOSIS — I10 Essential (primary) hypertension: Secondary | ICD-10-CM

## 2023-06-29 DIAGNOSIS — I480 Paroxysmal atrial fibrillation: Secondary | ICD-10-CM

## 2023-07-02 ENCOUNTER — Encounter: Payer: Self-pay | Admitting: Internal Medicine

## 2023-07-03 ENCOUNTER — Encounter: Payer: Self-pay | Admitting: Internal Medicine

## 2023-07-03 ENCOUNTER — Ambulatory Visit (INDEPENDENT_AMBULATORY_CARE_PROVIDER_SITE_OTHER): Admitting: Internal Medicine

## 2023-07-03 VITALS — BP 128/74 | HR 61 | Temp 98.9°F | Ht 63.0 in | Wt 243.0 lb

## 2023-07-03 DIAGNOSIS — J471 Bronchiectasis with (acute) exacerbation: Secondary | ICD-10-CM | POA: Diagnosis not present

## 2023-07-03 MED ORDER — BENZONATATE 200 MG PO CAPS
200.0000 mg | ORAL_CAPSULE | Freq: Three times a day (TID) | ORAL | 0 refills | Status: DC | PRN
Start: 2023-07-03 — End: 2023-07-17

## 2023-07-03 MED ORDER — DOXYCYCLINE HYCLATE 100 MG PO TABS: 100.0000 mg | ORAL_TABLET | Freq: Two times a day (BID) | ORAL | 0 refills | Status: DC

## 2023-07-03 NOTE — Progress Notes (Signed)
 Subjective:    Patient ID: Tina Pacheco, female    DOB: 1950/07/27, 73 y.o.   MRN: [id number]  HPI Here due to cough With daughter  Got sick from daughter about 3 weeks ago Still with bad cough--especially at night Lots of sputum--clear No fever Some SOB---not more than usual (other than with cough) Head stuffiness No ear pain  Sore throat at first--this is better  Coricidin---not helping too much  Current Outpatient Medications on File Prior to Visit  Medication Sig Dispense Refill   amiodarone  (PACERONE ) 200 MG tablet TAKE 1 TABLET(200 MG) BY MOUTH DAILY 90 tablet 0   apixaban  (ELIQUIS ) 5 MG TABS tablet Take 1 tablet (5 mg total) by mouth 2 (two) times daily. 180 tablet 1   atorvastatin  (LIPITOR) 40 MG tablet TAKE 1 TABLET(40 MG) BY MOUTH DAILY 90 tablet 3   Certolizumab Pegol  (CIMZIA  ) Inject into the skin. Patient states taking 1 x per month     ketoconazole  (NIZORAL ) 2 % cream Apply 1 Application topically as needed.     leflunomide  (ARAVA ) 20 MG tablet Take 20 mg by mouth daily.     metolazone  (ZAROXOLYN ) 2.5 MG tablet TAKE 1 TABLET BY MOUTH DAILY AS NEEDED. MAY GIVE 1 TABLET AS NEEDED FOR WEIGHT GAIN OF 5 LB IN 1 WEEK 30 tablet 5   metoprolol  succinate (TOPROL -XL) 50 MG 24 hr tablet TAKE 1 TABLET BY MOUTH EVERY DAY WITH OR IMMEDIATELY FOLLOWING A MEAL 90 tablet 3   montelukast  (SINGULAIR ) 10 MG tablet TAKE 1 TABLET BY MOUTH AT BEDTIME 90 tablet 3   nitroGLYCERIN  (NITROSTAT ) 0.4 MG SL tablet Place 1 tablet (0.4 mg total) under the tongue every 5 (five) minutes as needed for chest pain. 20 tablet 12   nystatin  (MYCOSTATIN /NYSTOP ) powder Apply 1 application topically 3 (three) times daily as needed. 60 g 0   omeprazole  (PRILOSEC) 40 MG capsule TAKE 1 CAPSULE(40 MG) BY MOUTH AT BEDTIME 90 capsule 2   Oxycodone  HCl 10 MG TABS Take 0.5-1 tablets (5-10 mg total) by mouth 3 (three) times daily as needed. 90 tablet 0   polyethylene glycol (MIRALAX  / GLYCOLAX ) packet Take 17 g  by mouth daily as needed for mild constipation. 14 each 0   potassium chloride  SA (KLOR-CON  M) 20 MEQ tablet Take 1 tablet (20 mEq total) by mouth daily. 90 tablet 3   predniSONE  (DELTASONE ) 10 MG tablet Take 5 mg by mouth.     senna-docusate (SENOKOT-S) 8.6-50 MG tablet Take 2 tablets by mouth at bedtime.     spironolactone  (ALDACTONE ) 25 MG tablet TAKE 1 TABLET(25 MG) BY MOUTH DAILY 90 tablet 3   terazosin  (HYTRIN ) 10 MG capsule TAKE 1 CAPSULE(10 MG) BY MOUTH AT BEDTIME 90 capsule 3   torsemide  (DEMADEX ) 20 MG tablet Take 2 tablets (40 mg total) by mouth daily. 60 tablet 5   No current facility-administered medications on file prior to visit.    Allergies  Allergen Reactions   Fluoxetine  Other (See Comments)    Headache, shaking, sleep issues Headache, shaking, sleep issues   Codeine     Nausea and vomiting/only when taking too much    Past Medical History:  Diagnosis Date   (HFpEF) heart failure with preserved ejection fraction (HCC)    a.) TTE 08/10/2014: EF 55-60%, mild MAC, mild LAE, mild MR, PASP 38; b.) TTE 03/31/2019: EF 55-60%, mild LVH, mild-mod LAW, mod MAC, mild-mod AoV sclerosis, triv PR, G1DD; c.) TTE 05/29/2020: EF 60-65%, mild LVH, mod LAE,  G1DD   Anemia    Aortic atherosclerosis (HCC)    Asthma    Basal cell carcinoma    CAD (coronary artery disease)    a.) LHC 05/24/2010 - nonobstructive CAD - med mgmt; b.) LHC 06/04/2020: 30% dLAD, 40% o-mLAD - med mgmt   Chronic, continuous use of opioids    Claustrophobia    COPD (chronic obstructive pulmonary disease) (HCC)    DDD (degenerative disc disease), lumbosacral    Diuretic-induced hypokalemia    Dyslipidemia    Fibromyalgia    Frequent falls    GERD (gastroesophageal reflux disease)    Gouty arthropathy    Hemihypertrophy    History of bilateral cataract extraction 2018   Hyperplastic colonic polyp 2003   Hypertension    Long term current use of amiodarone     Long term current use of anticoagulant    a.)  apixaban    Long term current use of immunosuppressive drug    a.) certolizumab pegol   + predisone for RA diagnosis   MDD (major depressive disorder)    Migraines    Morbid obesity (HCC)    Nontraumatic complete tear of rotator cuff, left    OSA (obstructive sleep apnea)    a.) non-complaint with prescribed nocturnal PAP therapy   Osteoarthritis    PAF (paroxysmal atrial fibrillation) (HCC)    a.) CHA2DS2-VASc = 5 (age, sex, CHF, HTN, vascular disease history); b.) rate and rhythm maintained on oral amiodarone  + metoprolol  succinate; chronically anticoagulated with standard dose apixaban    Pneumonia    PONV (postoperative nausea and vomiting)    Prediabetes    Rheumatoid arthritis (HCC)    a.) on certolizumab pegol  + predisone   Stage 3 chronic kidney disease (HCC)     Past Surgical History:  Procedure Laterality Date   ABDOMINAL HYSTERECTOMY     APPLICATION OF WOUND VAC Right 08/09/2017   Procedure: APPLICATION OF WOUND VAC;  Surgeon: Sharlyn Deaner, DPM;  Location: [facility];  Service: Podiatry;  Laterality: Right;   CARDIAC CATHETERIZATION  05/24/2010   nonobstructive CAD   CATARACT EXTRACTION W/ INTRAOCULAR LENS IMPLANT Right 06/12/2016   Dr. Ronni Colace   CATARACT EXTRACTION W/ INTRAOCULAR LENS IMPLANT Left 06/26/2016   Dr. Ronni Colace   CESAREAN SECTION     CHOLECYSTECTOMY     COLONOSCOPY  06/12/2011   Procedure: COLONOSCOPY;  Surgeon: Tami Falcon, MD;  Location: [facility];  Service: Endoscopy;  Laterality: N/A;   COLONOSCOPY N/A 03/17/2013   Procedure: COLONOSCOPY;  Surgeon: Tami Falcon, MD;  Location: [facility];  Service: Endoscopy;  Laterality: N/A;   FOOT ARTHRODESIS Right 07/13/2017   Procedure: ARTHRODESIS FOOT-MULTI.FUSIONS (6 JOINTS);  Surgeon: Sharlyn Deaner, DPM;  Location: [facility];  Service: Podiatry;  Laterality: Right;   IRRIGATION AND DEBRIDEMENT FOOT Right 08/09/2017   Procedure: IRRIGATION AND DEBRIDEMENT FOOT;  Surgeon: Sharlyn Deaner,  DPM;  Location: [facility];  Service: Podiatry;  Laterality: Right;   KNEE ARTHROSCOPY Bilateral    bilateral   LEFT HEART CATH AND CORONARY ANGIOGRAPHY N/A 06/04/2020   Procedure: LEFT HEART CATH AND CORONARY ANGIOGRAPHY;  Surgeon: Wenona Hamilton, MD;  Location: [facility];  Service: Cardiovascular;  Laterality: N/A;   REVERSE SHOULDER ARTHROPLASTY Right 11/16/2020   Procedure: REVERSE SHOULDER ARTHROPLASTY;  Surgeon: Elner Hahn, MD;  Location: [facility];  Service: Orthopedics;  Laterality: Right;   REVERSE SHOULDER ARTHROPLASTY Left 10/03/2022   Procedure: REVERSE SHOULDER ARTHROPLASTY;  Surgeon: Elner Hahn, MD;  Location: [facility];  Service: Orthopedics;  Laterality: Left;   SHOULDER CLOSED REDUCTION Right 11/11/2020   Procedure: CLOSED REDUCTION SHOULDER;  Surgeon: Elner Hahn, MD;  Location: [facility];  Service: Orthopedics;  Laterality: Right;   SHOULDER CLOSED REDUCTION Left 10/02/2022   Procedure: CLOSED REDUCTION SHOULDER;  Surgeon: Venus Ginsberg, MD;  Location: [facility];  Service: Orthopedics;  Laterality: Left;   SHOULDER INJECTION Right 11/11/2020   Procedure: SHOULDER INJECTION;  Surgeon: Elner Hahn, MD;  Location: [facility];  Service: Orthopedics;  Laterality: Right;   SINUSOTOMY     TOOTH EXTRACTION  12/2016   TOTAL SHOULDER REVISION Right 06/27/2022   Procedure: TOTAL SHOULDER REVISION;  Surgeon: Elner Hahn, MD;  Location: [facility];  Service: Orthopedics;  Laterality: Right;    Family History  Problem Relation Age of Onset   Tuberculosis Mother    Parkinsonism Mother    Emphysema Father        smoked   Arthritis Father    Cancer Sister    Diabetes type II Sister    Breast cancer Sister    Tuberculosis Sister    Breast cancer Maternal Aunt    Colon cancer Neg Hx    Esophageal cancer Neg Hx    Pancreatic cancer Neg Hx    Stomach cancer Neg Hx     Social History   Socioeconomic History   Marital status: Married    Spouse name: Geneva Kerbs    Number of children: 1   Years of education: Not on file   Highest education level: Bachelor's degree (e.g., BA, AB, BS)  Occupational History   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  Tobacco Use   Smoking status: Never    Passive exposure: Past   Smokeless tobacco: Never  Vaping Use   Vaping status: Never Used  Substance and Sexual Activity   Alcohol use: No   Drug use: No   Sexual activity: Not Currently  Other Topics Concern   Not on file  Social History Narrative   No living will   Daughter should be decision maker   Would accept resuscitation attempts--but no prolonged ventilator or tube feeds   Social Drivers of Health   Financial Resource Strain: Low Risk  (06/11/2023)   Received from [facility]   Overall Financial Resource Strain (CARDIA)    Difficulty of Paying Living Expenses: Not hard at all  Food Insecurity: No Food Insecurity (06/11/2023)   Received from [facility]   Hunger Vital Sign    Worried About Running Out of Food in the Last Year: Never true    Ran Out of Food in the Last Year: Never true  Transportation Needs: No Transportation Needs (06/11/2023)   Received from [facility]    In the past 12 months, has lack of transportation kept you from medical appointments or from getting medications?: No    Lack of Transportation (Non-Medical): No  Physical Activity: Unknown (05/18/2023)   Exercise Vital Sign    Days of Exercise per Week: 0 days    Minutes of Exercise per Session: Not on file  Stress: Stress Concern Present (05/18/2023)   [employer] of Occupational Health - Occupational Stress Questionnaire    Feeling of Stress : Very much  Social Connections: Moderately Isolated (05/18/2023)   Social Connection and Isolation Panel [NHANES]    Frequency of Communication with Friends and Family: Twice a week  Frequency of Social Gatherings  with Friends and Family: More than three times a week    Attends Religious Services: Never    Database administrator or Organizations: No    Attends Engineer, structural: Not on file    Marital Status: Married  Catering manager Violence: Not At Risk (10/02/2022)   Humiliation, Afraid, Rape, and Kick questionnaire    Fear of Current or Ex-Partner: No    Emotionally Abused: No    Physically Abused: No    Sexually Abused: No   Review of Systems Still working on shoulder dislocation---going to a specialist    Objective:   Physical Exam Constitutional:      Appearance: Normal appearance.  HENT:     Right Ear: Tympanic membrane and ear canal normal.     Left Ear: Tympanic membrane and ear canal normal.     Mouth/Throat:     Pharynx: No oropharyngeal exudate or posterior oropharyngeal erythema.  Cardiovascular:     Rate and Rhythm: Normal rate and regular rhythm.     Heart sounds: No murmur heard.    No gallop.  Pulmonary:     Effort: Pulmonary effort is normal.     Breath sounds: Normal breath sounds. No wheezing or rales.  Musculoskeletal:     Cervical back: Neck supple.  Lymphadenopathy:     Cervical: No cervical adenopathy.  Neurological:     Mental Status: She is alert.            Assessment & Plan:

## 2023-07-03 NOTE — Telephone Encounter (Signed)
 Called and spoke to pt's daughter per DPR. Advised that she needed to come in to be evaluated. Made OV for 1045 today.

## 2023-07-03 NOTE — Assessment & Plan Note (Signed)
 Started with apparent viral infection and now with persistent productive cough Nothing to suggest airspace disease Will treat with doxy 100  bid x 7 days Benzonatate  for cough

## 2023-07-04 DIAGNOSIS — G8929 Other chronic pain: Secondary | ICD-10-CM | POA: Diagnosis not present

## 2023-07-04 DIAGNOSIS — M25511 Pain in right shoulder: Secondary | ICD-10-CM | POA: Diagnosis not present

## 2023-07-11 ENCOUNTER — Other Ambulatory Visit: Payer: Self-pay | Admitting: Internal Medicine

## 2023-07-11 DIAGNOSIS — E782 Mixed hyperlipidemia: Secondary | ICD-10-CM

## 2023-07-12 DIAGNOSIS — Z0189 Encounter for other specified special examinations: Secondary | ICD-10-CM | POA: Diagnosis not present

## 2023-07-12 DIAGNOSIS — Z96611 Presence of right artificial shoulder joint: Secondary | ICD-10-CM | POA: Diagnosis not present

## 2023-07-17 ENCOUNTER — Ambulatory Visit (INDEPENDENT_AMBULATORY_CARE_PROVIDER_SITE_OTHER): Admitting: Internal Medicine

## 2023-07-17 ENCOUNTER — Encounter: Payer: Self-pay | Admitting: Internal Medicine

## 2023-07-17 VITALS — BP 120/74 | HR 65 | Temp 98.5°F | Ht 63.0 in | Wt 243.0 lb

## 2023-07-17 DIAGNOSIS — J471 Bronchiectasis with (acute) exacerbation: Secondary | ICD-10-CM | POA: Diagnosis not present

## 2023-07-17 DIAGNOSIS — M75121 Complete rotator cuff tear or rupture of right shoulder, not specified as traumatic: Secondary | ICD-10-CM

## 2023-07-17 LAB — CBC WITH DIFFERENTIAL/PLATELET
Basophils Absolute: 0.1 10*3/uL (ref 0.0–0.1)
Basophils Relative: 1.3 % (ref 0.0–3.0)
Eosinophils Absolute: 0.3 10*3/uL (ref 0.0–0.7)
Eosinophils Relative: 5.8 % — ABNORMAL HIGH (ref 0.0–5.0)
HCT: 32.6 % — ABNORMAL LOW (ref 36.0–46.0)
Hemoglobin: 10.6 g/dL — ABNORMAL LOW (ref 12.0–15.0)
Lymphocytes Relative: 24.9 % (ref 12.0–46.0)
Lymphs Abs: 1.3 10*3/uL (ref 0.7–4.0)
MCHC: 32.5 g/dL (ref 30.0–36.0)
MCV: 84.8 fl (ref 78.0–100.0)
Monocytes Absolute: 0.5 10*3/uL (ref 0.1–1.0)
Monocytes Relative: 9.2 % (ref 3.0–12.0)
Neutro Abs: 3.1 10*3/uL (ref 1.4–7.7)
Neutrophils Relative %: 58.8 % (ref 43.0–77.0)
Platelets: 265 10*3/uL (ref 150.0–400.0)
RBC: 3.85 Mil/uL — ABNORMAL LOW (ref 3.87–5.11)
RDW: 17.5 % — ABNORMAL HIGH (ref 11.5–15.5)
WBC: 5.2 10*3/uL (ref 4.0–10.5)

## 2023-07-17 LAB — RENAL FUNCTION PANEL
Albumin: 3.5 g/dL (ref 3.5–5.2)
BUN: 23 mg/dL (ref 6–23)
CO2: 26 meq/L (ref 19–32)
Calcium: 10.3 mg/dL (ref 8.4–10.5)
Chloride: 99 meq/L (ref 96–112)
Creatinine, Ser: 1.18 mg/dL (ref 0.40–1.20)
GFR: 45.84 mL/min — ABNORMAL LOW (ref >60.00)
Glucose, Bld: 92 mg/dL (ref 70–99)
Phosphorus: 3.4 mg/dL (ref 2.3–4.6)
Potassium: 4.5 meq/L (ref 3.5–5.1)
Sodium: 136 meq/L (ref 135–145)

## 2023-07-17 LAB — C-REACTIVE PROTEIN: CRP: 3.4 mg/dL (ref 0.5–20.0)

## 2023-07-17 MED ORDER — LEVOFLOXACIN 500 MG PO TABS: 500.0000 mg | ORAL_TABLET | Freq: Every day | ORAL | 0 refills | Status: DC

## 2023-07-17 MED ORDER — AMOXICILLIN-POT CLAVULANATE 875-125 MG PO TABS: 1.0000 | ORAL_TABLET | Freq: Two times a day (BID) | ORAL | 0 refills | Status: DC

## 2023-07-17 MED ORDER — BENZONATATE 200 MG PO CAPS: 200.0000 mg | ORAL_CAPSULE | Freq: Three times a day (TID) | ORAL | 0 refills | Status: DC | PRN

## 2023-07-17 NOTE — Progress Notes (Signed)
 Subjective:    Patient ID: Tina Pacheco, female    DOB: Jul 22, 1950, 73 y.o.   MRN: [id number]  HPI Here with daughter due to ongoing cough  Still coughing-not as much phlegm but "still collects" Hard getting it up--so swallows Hears noises --can't tell if it is in chest or head No fever No change in chronic SOB  Needs right shoulder surgery again Needs preop blood work for ortho--daughter can't get her to [facility]  Current Outpatient Medications on File Prior to Visit  Medication Sig Dispense Refill   amiodarone  (PACERONE ) 200 MG tablet TAKE 1 TABLET(200 MG) BY MOUTH DAILY 90 tablet 0   apixaban  (ELIQUIS ) 5 MG TABS tablet Take 1 tablet (5 mg total) by mouth 2 (two) times daily. 180 tablet 1   atorvastatin  (LIPITOR) 40 MG tablet TAKE 1 TABLET(40 MG) BY MOUTH DAILY 90 tablet 3   benzonatate  (TESSALON ) 200 MG capsule Take 1 capsule (200 mg total) by mouth 3 (three) times daily as needed for cough. 60 capsule 0   Certolizumab Pegol  (CIMZIA  [place]) Inject into the skin. Patient states taking 1 x per month     ketoconazole  (NIZORAL ) 2 % cream Apply 1 Application topically as needed.     leflunomide  (ARAVA ) 20 MG tablet Take 20 mg by mouth daily.     metolazone  (ZAROXOLYN ) 2.5 MG tablet TAKE 1 TABLET BY MOUTH DAILY AS NEEDED. MAY GIVE 1 TABLET AS NEEDED FOR WEIGHT GAIN OF 5 LB IN 1 WEEK 30 tablet 5   metoprolol  succinate (TOPROL -XL) 50 MG 24 hr tablet TAKE 1 TABLET BY MOUTH EVERY DAY WITH OR IMMEDIATELY FOLLOWING A MEAL 90 tablet 3   montelukast  (SINGULAIR ) 10 MG tablet TAKE 1 TABLET BY MOUTH AT BEDTIME 90 tablet 3   nitroGLYCERIN  (NITROSTAT ) 0.4 MG SL tablet Place 1 tablet (0.4 mg total) under the tongue every 5 (five) minutes as needed for chest pain. 20 tablet 12   nystatin  (MYCOSTATIN /NYSTOP ) powder Apply 1 application topically 3 (three) times daily as needed. 60 g 0   omeprazole  (PRILOSEC) 40 MG capsule TAKE 1 CAPSULE(40 MG) BY MOUTH AT BEDTIME 90 capsule 2   Oxycodone  HCl 10 MG TABS  Take 0.5-1 tablets (5-10 mg total) by mouth 3 (three) times daily as needed. 90 tablet 0   polyethylene glycol (MIRALAX  / GLYCOLAX ) packet Take 17 g by mouth daily as needed for mild constipation. 14 each 0   potassium chloride  SA (KLOR-CON  M) 20 MEQ tablet Take 1 tablet (20 mEq total) by mouth daily. 90 tablet 3   predniSONE  (DELTASONE ) 5 MG tablet Take 5 mg by mouth daily.     senna-docusate (SENOKOT-S) 8.6-50 MG tablet Take 2 tablets by mouth at bedtime.     spironolactone  (ALDACTONE ) 25 MG tablet TAKE 1 TABLET(25 MG) BY MOUTH DAILY 90 tablet 3   terazosin  (HYTRIN ) 10 MG capsule TAKE 1 CAPSULE(10 MG) BY MOUTH AT BEDTIME 90 capsule 3   torsemide  (DEMADEX ) 20 MG tablet Take 2 tablets (40 mg total) by mouth daily. 60 tablet 5   No current facility-administered medications on file prior to visit.    Allergies  Allergen Reactions   Fluoxetine  Other (See Comments)    Headache, shaking, sleep issues Headache, shaking, sleep issues   Codeine     Nausea and vomiting/only when taking too much    Past Medical History:  Diagnosis Date   (HFpEF) heart failure with preserved ejection fraction (HCC)    a.) TTE 08/10/2014: EF 55-60%, mild MAC, mild  LAE, mild MR, PASP 38; b.) TTE 03/31/2019: EF 55-60%, mild LVH, mild-mod LAW, mod MAC, mild-mod AoV sclerosis, triv PR, G1DD; c.) TTE 05/29/2020: EF 60-65%, mild LVH, mod LAE, G1DD   Anemia    Aortic atherosclerosis (HCC)    Asthma    Basal cell carcinoma    CAD (coronary artery disease)    a.) LHC 05/24/2010 - nonobstructive CAD - med mgmt; b.) LHC 06/04/2020: 30% dLAD, 40% o-mLAD - med mgmt   Chronic, continuous use of opioids    Claustrophobia    COPD (chronic obstructive pulmonary disease) (HCC)    DDD (degenerative disc disease), lumbosacral    Diuretic-induced hypokalemia    Dyslipidemia    Fibromyalgia    Frequent falls    GERD (gastroesophageal reflux disease)    Gouty arthropathy    Hemihypertrophy    History of bilateral cataract  extraction 2018   Hyperplastic colonic polyp 2003   Hypertension    Long term current use of amiodarone     Long term current use of anticoagulant    a.) apixaban    Long term current use of immunosuppressive drug    a.) certolizumab pegol   + predisone for RA diagnosis   MDD (major depressive disorder)    Migraines    Morbid obesity (HCC)    Nontraumatic complete tear of rotator cuff, left    OSA (obstructive sleep apnea)    a.) non-complaint with prescribed nocturnal PAP therapy   Osteoarthritis    PAF (paroxysmal atrial fibrillation) (HCC)    a.) CHA2DS2-VASc = 5 (age, sex, CHF, HTN, vascular disease history); b.) rate and rhythm maintained on oral amiodarone  + metoprolol  succinate; chronically anticoagulated with standard dose apixaban    Pneumonia    PONV (postoperative nausea and vomiting)    Prediabetes    Rheumatoid arthritis (HCC)    a.) on certolizumab pegol  + predisone   Stage 3 chronic kidney disease (HCC)     Past Surgical History:  Procedure Laterality Date   ABDOMINAL HYSTERECTOMY     APPLICATION OF WOUND VAC Right 08/09/2017   Procedure: APPLICATION OF WOUND VAC;  Surgeon: Sharlyn Deaner, DPM;  Location: [facility];  Service: Podiatry;  Laterality: Right;   CARDIAC CATHETERIZATION  05/24/2010   nonobstructive CAD   CATARACT EXTRACTION W/ INTRAOCULAR LENS IMPLANT Right 06/12/2016   Dr. Ronni Colace   CATARACT EXTRACTION W/ INTRAOCULAR LENS IMPLANT Left 06/26/2016   Dr. Ronni Colace   CESAREAN SECTION     CHOLECYSTECTOMY     COLONOSCOPY  06/12/2011   Procedure: COLONOSCOPY;  Surgeon: Tami Falcon, MD;  [facility]: [facility];  Service: Endoscopy;  Laterality: N/A;   COLONOSCOPY N/A 03/17/2013   Procedure: COLONOSCOPY;  Surgeon: Tami Falcon, MD;  Location: [facility];  Service: Endoscopy;  Laterality: N/A;   FOOT ARTHRODESIS Right 07/13/2017   Procedure: ARTHRODESIS FOOT-MULTI.FUSIONS (6 JOINTS);  Surgeon: Sharlyn Deaner, DPM;  Location: [facility];  Service:  Podiatry;  Laterality: Right;   IRRIGATION AND DEBRIDEMENT FOOT Right 08/09/2017   Procedure: IRRIGATION AND DEBRIDEMENT FOOT;  Surgeon: Sharlyn Deaner, DPM;  Location: [facility];  Service: Podiatry;  Laterality: Right;   KNEE ARTHROSCOPY Bilateral    bilateral   LEFT HEART CATH AND CORONARY ANGIOGRAPHY N/A 06/04/2020   Procedure: LEFT HEART CATH AND CORONARY ANGIOGRAPHY;  Surgeon: Wenona Hamilton, MD;  Location: [facility];  Service: Cardiovascular;  Laterality: N/A;   REVERSE SHOULDER ARTHROPLASTY Right 11/16/2020   Procedure: REVERSE SHOULDER ARTHROPLASTY;  Surgeon: Elner Hahn, MD;  Location:  [facility];  Service: Orthopedics;  Laterality: Right;   REVERSE SHOULDER ARTHROPLASTY Left 10/03/2022   Procedure: REVERSE SHOULDER ARTHROPLASTY;  Surgeon: Elner Hahn, MD;  Location: [facility];  Service: Orthopedics;  Laterality: Left;   SHOULDER CLOSED REDUCTION Right 11/11/2020   Procedure: CLOSED REDUCTION SHOULDER;  Surgeon: Elner Hahn, MD;  Location: [facility];  Service: Orthopedics;  Laterality: Right;   SHOULDER CLOSED REDUCTION Left 10/02/2022   Procedure: CLOSED REDUCTION SHOULDER;  Surgeon: Venus Ginsberg, MD;  Location: [facility];  Service: Orthopedics;  Laterality: Left;   SHOULDER INJECTION Right 11/11/2020   Procedure: SHOULDER INJECTION;  Surgeon: Elner Hahn, MD;  Location: [facility];  Service: Orthopedics;  Laterality: Right;   SINUSOTOMY     TOOTH EXTRACTION  12/2016   TOTAL SHOULDER REVISION Right 06/27/2022   Procedure: TOTAL SHOULDER REVISION;  Surgeon: Elner Hahn, MD;  Location: [facility];  Service: Orthopedics;  Laterality: Right;    Family History  Problem Relation Age of Onset   Tuberculosis Mother    Parkinsonism Mother    Emphysema Father        smoked   Arthritis Father    Cancer Sister    Diabetes type II Sister    Breast cancer Sister    Tuberculosis Sister    Breast cancer Maternal Aunt    Colon cancer Neg Hx    Esophageal cancer Neg Hx     Pancreatic cancer Neg Hx    Stomach cancer Neg Hx     Social History   Socioeconomic History   Marital status: Married    Spouse name: Geneva Kerbs   Number of children: 1   Years of education: Not on file   Highest education level: Bachelor's degree (e.g., BA, AB, BS)  Occupational History   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  Tobacco Use   Smoking status: Never    Passive exposure: Past   Smokeless tobacco: Never  Vaping Use   Vaping status: Never Used  Substance and Sexual Activity   Alcohol use: No   Drug use: No   Sexual activity: Not Currently  Other Topics Concern   Not on file  Social History Narrative   No living will   Daughter should be decision maker   Would accept resuscitation attempts--but no prolonged ventilator or tube feeds   Social Drivers of Health   Financial Resource Strain: Low Risk  (07/11/2023)   Received from [facility]   Overall Financial Resource Strain (CARDIA)    Difficulty of Paying Living Expenses: Not hard at all  Food Insecurity: No Food Insecurity (07/11/2023)   Received from [facility]   Hunger Vital Sign    Worried About Running Out of Food in the Last Year: Never true    Ran Out of Food in the Last Year: Never true  Transportation Needs: No Transportation Needs (07/11/2023)   Received from [facility]    In the past 12 months, has lack of transportation kept you from medical appointments or from getting medications?: No    Lack of Transportation (Non-Medical): No  Physical Activity: Unknown (05/18/2023)   Exercise Vital Sign    Days of Exercise per Week: 0 days    Minutes of Exercise per Session: Not on file  Stress: Stress Concern Present (05/18/2023)   [employer] of Occupational Health - Occupational Stress Questionnaire    Feeling  of Stress : Very much  Social Connections: Moderately Isolated (05/18/2023)    Social Connection and Isolation Panel [NHANES]    Frequency of Communication with Friends and Family: Twice a week    Frequency of Social Gatherings with Friends and Family: More than three times a week    Attends Religious Services: Never    Database administrator or Organizations: No    Attends Engineer, structural: Not on file    Marital Status: Married  Catering manager Violence: Not At Risk (10/02/2022)   Humiliation, Afraid, Rape, and Kick questionnaire    Fear of Current or Ex-Partner: No    Emotionally Abused: No    Physically Abused: No    Sexually Abused: No   Review of Systems No vomiting--mild nausea---relates to swallowed phlegm Appetite is off--mostly due to teeth issues    Objective:   Physical Exam Constitutional:      Appearance: Normal appearance.     Comments: Cough--loose  HENT:     Head:     Comments: No sinus tenderness    Mouth/Throat:     Pharynx: No oropharyngeal exudate or posterior oropharyngeal erythema.  Pulmonary:     Effort: Pulmonary effort is normal.     Breath sounds: Normal breath sounds. No wheezing or rales.  Musculoskeletal:     Cervical back: Neck supple.  Lymphadenopathy:     Cervical: No cervical adenopathy.  Neurological:     Mental Status: She is alert.            Assessment & Plan:

## 2023-07-17 NOTE — Assessment & Plan Note (Addendum)
 Partial response to doxy I am concerned about Pseudomonas--but levaquin not an option with the amiodarone  Will try augmentin  Refill the benzonatate 

## 2023-07-18 ENCOUNTER — Ambulatory Visit: Payer: Self-pay | Admitting: Internal Medicine

## 2023-07-24 DIAGNOSIS — M0579 Rheumatoid arthritis with rheumatoid factor of multiple sites without organ or systems involvement: Secondary | ICD-10-CM | POA: Diagnosis not present

## 2023-07-25 ENCOUNTER — Other Ambulatory Visit: Payer: Self-pay | Admitting: Internal Medicine

## 2023-07-25 MED ORDER — OXYCODONE HCL 10 MG PO TABS: 5.0000 mg | ORAL_TABLET | Freq: Three times a day (TID) | ORAL | 0 refills | Status: DC | PRN

## 2023-07-25 NOTE — Telephone Encounter (Signed)
 Name of Medication: Oxycodone  10mg  Name of Pharmacy: [facility] [address] Last [place] or Written Date and Quantity: 06-27-23 #90 Last Office Visit and Type: 07-17-23 Next Office Visit and Type: 08-21-23 Last Controlled Substance Agreement Date: 05-18-23 Last UDS: 05-18-23

## 2023-07-25 NOTE — Telephone Encounter (Signed)
 Copied from CRM [id number]. Topic: Clinical - Medication Refill >> Jul 25, 2023  2:26 PM Annelle Kiel wrote: Medication: Oxycodone  HCl 10 MG TABS [[id number]  Has the patient contacted their pharmacy? No (Agent: If no, request that the patient contact the pharmacy for the refill. If patient does not wish to contact the pharmacy document the reason why and proceed with request.) (Agent: If yes, when and what did the pharmacy advise?)  This is the patient's preferred pharmacy:  [id number] - [place], [place] - [address] AT [facility] OF [address] [facility] [address] [place] [address] Phone: [phone] Fax: [phone]   Is this the correct pharmacy for this prescription? Yes If no, delete pharmacy and type the correct one.   Has the prescription been filled recently? No  Is the patient out of the medication? Yes  Has the patient been seen for an appointment in the last year OR does the patient have an upcoming appointment? Yes  Can we respond through MyChart? Yes  Agent: Please be advised that Rx refills may take up to 3 business days. We ask that you follow-up with your pharmacy.

## 2023-08-01 DIAGNOSIS — Z96619 Presence of unspecified artificial shoulder joint: Secondary | ICD-10-CM | POA: Diagnosis not present

## 2023-08-01 DIAGNOSIS — L89152 Pressure ulcer of sacral region, stage 2: Secondary | ICD-10-CM | POA: Diagnosis not present

## 2023-08-01 DIAGNOSIS — Z96611 Presence of right artificial shoulder joint: Secondary | ICD-10-CM | POA: Diagnosis not present

## 2023-08-01 DIAGNOSIS — T84028S Dislocation of other internal joint prosthesis, sequela: Secondary | ICD-10-CM | POA: Diagnosis not present

## 2023-08-13 ENCOUNTER — Telehealth: Payer: Self-pay | Admitting: Internal Medicine

## 2023-08-13 ENCOUNTER — Encounter: Payer: Self-pay | Admitting: Cardiovascular Disease

## 2023-08-13 ENCOUNTER — Other Ambulatory Visit: Payer: Self-pay

## 2023-08-13 DIAGNOSIS — I480 Paroxysmal atrial fibrillation: Secondary | ICD-10-CM

## 2023-08-13 MED ORDER — APIXABAN 5 MG PO TABS: 5.0000 mg | ORAL_TABLET | Freq: Two times a day (BID) | ORAL | 3 refills | Status: DC

## 2023-08-13 MED ORDER — APIXABAN 5 MG PO TABS: 5.0000 mg | ORAL_TABLET | Freq: Two times a day (BID) | ORAL | Status: DC

## 2023-08-13 MED ORDER — APIXABAN 5 MG PO TABS: 5.0000 mg | ORAL_TABLET | Freq: Two times a day (BID) | ORAL | 3 refills | Status: AC

## 2023-08-13 NOTE — Telephone Encounter (Signed)
 According to someone at [facility]: we dont write for this as insurances do not pay, only pay some if they have medicaid.   This is the last thing one of the providers sent us  in a message about these :   FYI, pure wick is now available online without a prescription for patients.  It looks like the cost is about $400 for the device and battery, and the external catheters themselves are $200 for 30 pack.

## 2023-08-13 NOTE — Telephone Encounter (Signed)
 Copied from CRM [id number]. Topic: General - Other >> Aug 13, 2023 12:15 PM Kita Perish H wrote: Reason for CRM: Patient is calling to obtain the procedure code for the purewick urine collection system so that she can reach back out to Medicare to see if or what they will pay, or patient states Dr. Joelle Musca can reach out to the providers line at [facility] and find out if it's covered as well.  Adriana Hopping [phone]

## 2023-08-13 NOTE — Telephone Encounter (Signed)
 I am asking a coder if they know.

## 2023-08-14 ENCOUNTER — Telehealth: Payer: Self-pay | Admitting: Internal Medicine

## 2023-08-14 NOTE — Telephone Encounter (Signed)
 I was able to get in touch with one of our billing and coding specialist. She said it looks like there are 2 codes, E2001 for the device and A7002 for the tubing.

## 2023-08-14 NOTE — Telephone Encounter (Signed)
 Called and spoke to pt and gave her the information that I was able to get. She will check with her insurance.

## 2023-08-14 NOTE — Telephone Encounter (Signed)
 Left message to call office

## 2023-08-14 NOTE — Telephone Encounter (Signed)
 error

## 2023-08-17 DIAGNOSIS — I4891 Unspecified atrial fibrillation: Secondary | ICD-10-CM | POA: Diagnosis not present

## 2023-08-17 DIAGNOSIS — R296 Repeated falls: Secondary | ICD-10-CM | POA: Diagnosis not present

## 2023-08-17 DIAGNOSIS — G894 Chronic pain syndrome: Secondary | ICD-10-CM | POA: Diagnosis not present

## 2023-08-17 DIAGNOSIS — Z01818 Encounter for other preprocedural examination: Secondary | ICD-10-CM | POA: Diagnosis not present

## 2023-08-17 DIAGNOSIS — I5032 Chronic diastolic (congestive) heart failure: Secondary | ICD-10-CM | POA: Diagnosis not present

## 2023-08-17 DIAGNOSIS — J45909 Unspecified asthma, uncomplicated: Secondary | ICD-10-CM | POA: Diagnosis not present

## 2023-08-17 DIAGNOSIS — N1832 Chronic kidney disease, stage 3b: Secondary | ICD-10-CM | POA: Diagnosis not present

## 2023-08-17 DIAGNOSIS — Z131 Encounter for screening for diabetes mellitus: Secondary | ICD-10-CM | POA: Diagnosis not present

## 2023-08-17 DIAGNOSIS — G2581 Restless legs syndrome: Secondary | ICD-10-CM | POA: Diagnosis not present

## 2023-08-17 DIAGNOSIS — Z7901 Long term (current) use of anticoagulants: Secondary | ICD-10-CM | POA: Diagnosis not present

## 2023-08-17 DIAGNOSIS — I2583 Coronary atherosclerosis due to lipid rich plaque: Secondary | ICD-10-CM | POA: Diagnosis not present

## 2023-08-17 DIAGNOSIS — Z96619 Presence of unspecified artificial shoulder joint: Secondary | ICD-10-CM | POA: Diagnosis not present

## 2023-08-17 DIAGNOSIS — D649 Anemia, unspecified: Secondary | ICD-10-CM | POA: Diagnosis not present

## 2023-08-17 DIAGNOSIS — I10 Essential (primary) hypertension: Secondary | ICD-10-CM | POA: Diagnosis not present

## 2023-08-17 DIAGNOSIS — J449 Chronic obstructive pulmonary disease, unspecified: Secondary | ICD-10-CM | POA: Diagnosis not present

## 2023-08-17 DIAGNOSIS — T84028S Dislocation of other internal joint prosthesis, sequela: Secondary | ICD-10-CM | POA: Diagnosis not present

## 2023-08-17 DIAGNOSIS — M1A09X0 Idiopathic chronic gout, multiple sites, without tophus (tophi): Secondary | ICD-10-CM | POA: Diagnosis not present

## 2023-08-17 DIAGNOSIS — M0579 Rheumatoid arthritis with rheumatoid factor of multiple sites without organ or systems involvement: Secondary | ICD-10-CM | POA: Diagnosis not present

## 2023-08-17 DIAGNOSIS — R001 Bradycardia, unspecified: Secondary | ICD-10-CM | POA: Diagnosis not present

## 2023-08-21 ENCOUNTER — Ambulatory Visit: Admitting: Internal Medicine

## 2023-08-21 ENCOUNTER — Telehealth: Payer: Self-pay | Admitting: Cardiovascular Disease

## 2023-08-21 ENCOUNTER — Telehealth: Payer: Self-pay | Admitting: *Deleted

## 2023-08-21 NOTE — Telephone Encounter (Signed)
 Patient with diagnosis of afib on Eliquis  for anticoagulation.    Procedure: Revision of Total Shoulder Arthroplasty  Date of procedure: 09/04/23   CHA2DS2-VASc Score = 5   This indicates a 7.2% annual risk of stroke. The patient's score is based upon: CHF History: 1 HTN History: 1 Diabetes History: 0 Stroke History: 0 Vascular Disease History: 1 Age Score: 1 Gender Score: 1  CrCl 51 mL/min Platelet count 265 K   Per office protocol, patient can hold Eliquis  for 3 days prior to procedure.   Patient will not need bridging with Lovenox  (enoxaparin ) around procedure.  **This guidance is not considered finalized until pre-operative APP has relayed final recommendations.**

## 2023-08-21 NOTE — Telephone Encounter (Signed)
 Pt has been scheduled tele pt appt 08/29/23. Med rec and consent are done.

## 2023-08-21 NOTE — Telephone Encounter (Signed)
   Pre-operative Risk Assessment    Patient Name: Tina Pacheco  DOB: 07-Dec-1950 MRN: [id number]   Date of last office visit: 03/27/23 Date of next office visit: n/a   Request for Surgical Clearance    Procedure:  Revision of Total Shoulder Arthroplasty  Date of Surgery:  Clearance 09/04/23                                Surgeon:  Dr. Sandralee Surgeon's Group or Practice Name:  [facility] Phone number:  [phone] Fax number:  [phone]   Type of Clearance Requested:   - Medical    Type of Anesthesia:  Not Indicated   Additional requests/questions:    Bonney Othel VEAR Lavelle   08/21/2023, 2:53 PM

## 2023-08-21 NOTE — Telephone Encounter (Signed)
 Pt has been scheduled tele pt appt 08/29/23. Med rec and consent are done.     Patient Consent for Virtual Visit        Tina Pacheco has provided verbal consent on 08/21/2023 for a virtual visit (video or telephone).   CONSENT FOR VIRTUAL VISIT FOR:  Tina Pacheco  By participating in this virtual visit I agree to the following:  I hereby voluntarily request, consent and authorize [facility] and its employed or contracted physicians, physician assistants, nurse practitioners or other licensed health care professionals (the Practitioner), to provide me with telemedicine health care services (the "Services) as deemed necessary by the treating Practitioner. I acknowledge and consent to receive the Services by the Practitioner via telemedicine. I understand that the telemedicine visit will involve communicating with the Practitioner through live audiovisual communication technology and the disclosure of certain medical information by electronic transmission. I acknowledge that I have been given the opportunity to request an in-person assessment or other available alternative prior to the telemedicine visit and am voluntarily participating in the telemedicine visit.  I understand that I have the right to withhold or withdraw my consent to the use of telemedicine in the course of my care at any time, without affecting my right to future care or treatment, and that the Practitioner or I may terminate the telemedicine visit at any time. I understand that I have the right to inspect all information obtained and/or recorded in the course of the telemedicine visit and may receive copies of available information for a reasonable fee.  I understand that some of the potential risks of receiving the Services via telemedicine include:  Delay or interruption in medical evaluation due to technological equipment failure or disruption; Information transmitted may not be sufficient (e.g. poor resolution  of images) to allow for appropriate medical decision making by the Practitioner; and/or  In rare instances, security protocols could fail, causing a breach of personal health information.  Furthermore, I acknowledge that it is my responsibility to provide information about my medical history, conditions and care that is complete and accurate to the best of my ability. I acknowledge that Practitioner's advice, recommendations, and/or decision may be based on factors not within their control, such as incomplete or inaccurate data provided by me or distortions of diagnostic images or specimens that may result from electronic transmissions. I understand that the practice of medicine is not an exact science and that Practitioner makes no warranties or guarantees regarding treatment outcomes. I acknowledge that a copy of this consent can be made available to me via my patient portal [facility] MyChart), or I can request a printed copy by calling the office of [facility].    I understand that my insurance will be billed for this visit.   I have read or had this consent read to me. I understand the contents of this consent, which adequately explains the benefits and risks of the Services being provided via telemedicine.  I have been provided ample opportunity to ask questions regarding this consent and the Services and have had my questions answered to my satisfaction. I give my informed consent for the services to be provided through the use of telemedicine in my medical care

## 2023-08-21 NOTE — Telephone Encounter (Signed)
   Name: Tina Pacheco  DOB: 07/08/1950  MRN: [id number]  Primary Cardiologist: Evalene Lunger, MD   Preoperative team, please contact this patient and set up a phone call appointment for further preoperative risk assessment. Please obtain consent and complete medication review. Thank you for your help.  I confirm that guidance regarding antiplatelet and oral anticoagulation therapy has been completed and, if necessary, noted below.  Per office protocol, patient can hold Eliquis  for 3 days prior to procedure.   Patient will not need bridging with Lovenox  (enoxaparin ) around procedure.  I also confirmed the patient resides in the state of [place] . As per [facility] Medical Board telemedicine laws, the patient must reside in the state in which the provider is licensed.   Josefa CHRISTELLA Beauvais, NP 08/21/2023, 4:01 PM [facility]

## 2023-08-23 ENCOUNTER — Other Ambulatory Visit: Payer: Self-pay | Admitting: Internal Medicine

## 2023-08-23 MED ORDER — OXYCODONE HCL 10 MG PO TABS: 5.0000 mg | ORAL_TABLET | Freq: Three times a day (TID) | ORAL | 0 refills | Status: DC | PRN

## 2023-08-23 NOTE — Telephone Encounter (Signed)
 Name of Medication: Oxycodone  10mg  Name of Pharmacy: [facility] [address] [place] or Written Date and Quantity: 07-25-23 #90 Last Office Visit and Type: 07-17-23 Next Office Visit and Type: 08-28-23 Last Controlled Substance Agreement Date: 05-18-23 Last UDS: 05-18-23

## 2023-08-23 NOTE — Telephone Encounter (Signed)
 Copied from CRM [id number]. Topic: Clinical - Medication Refill >> Aug 23, 2023  8:47 AM Viola F wrote: Medication: 30 day supply, 90 pills -  Oxycodone  HCl 10 MG TABS [[id number]]  Has the patient contacted their pharmacy? Yes (Agent: If no, request that the patient contact the pharmacy for the refill. If patient does not wish to contact the pharmacy document the reason why and proceed with request.) (Agent: If yes, when and what did the pharmacy advise?)  This is the patient's preferred pharmacy:   [id number] - [place], [place] - [address] AT [facility] OF [address] [facility] [address] [place] [address] Phone: [phone] Fax: [phone]  Is this the correct pharmacy for this prescription? Yes If no, delete pharmacy and type the correct one.   Has the prescription been filled recently? Yes  Is the patient out of the medication? No  Has the patient been seen for an appointment in the last year OR does the patient have an upcoming appointment? Yes  Can we respond through MyChart? Yes  Agent: Please be advised that Rx refills may take up to 3 business days. We ask that you follow-up with your pharmacy.

## 2023-08-28 ENCOUNTER — Other Ambulatory Visit: Payer: Self-pay | Admitting: Internal Medicine

## 2023-08-28 ENCOUNTER — Encounter: Payer: Self-pay | Admitting: Internal Medicine

## 2023-08-28 ENCOUNTER — Ambulatory Visit: Admitting: Internal Medicine

## 2023-08-28 VITALS — BP 132/74 | HR 83 | Temp 98.3°F | Wt 237.0 lb

## 2023-08-28 DIAGNOSIS — F39 Unspecified mood [affective] disorder: Secondary | ICD-10-CM

## 2023-08-28 DIAGNOSIS — N1832 Chronic kidney disease, stage 3b: Secondary | ICD-10-CM | POA: Diagnosis not present

## 2023-08-28 DIAGNOSIS — I480 Paroxysmal atrial fibrillation: Secondary | ICD-10-CM | POA: Diagnosis not present

## 2023-08-28 DIAGNOSIS — I5032 Chronic diastolic (congestive) heart failure: Secondary | ICD-10-CM

## 2023-08-28 DIAGNOSIS — Z0000 Encounter for general adult medical examination without abnormal findings: Secondary | ICD-10-CM

## 2023-08-28 DIAGNOSIS — G894 Chronic pain syndrome: Secondary | ICD-10-CM | POA: Diagnosis not present

## 2023-08-28 DIAGNOSIS — Z1211 Encounter for screening for malignant neoplasm of colon: Secondary | ICD-10-CM

## 2023-08-28 DIAGNOSIS — M069 Rheumatoid arthritis, unspecified: Secondary | ICD-10-CM

## 2023-08-28 HISTORY — PX: SHOULDER SURGERY: SHX246

## 2023-08-28 MED ORDER — SPIRONOLACTONE 25 MG PO TABS: ORAL_TABLET | ORAL | 3 refills | Status: AC

## 2023-08-28 MED ORDER — TORSEMIDE 20 MG PO TABS: 20.0000 mg | ORAL_TABLET | Freq: Every day | ORAL | 5 refills | Status: AC

## 2023-08-28 NOTE — Progress Notes (Signed)
 Subjective:    Patient ID: Tina Pacheco, female    DOB: 1950-09-11, 73 y.o.   MRN: [id number]  HPI Here for Medicare wellness visit and follow up of chronic health conditions With daughter Reviewed advanced directives Reviewed other doctors---Dr Klifto--shoulder surgeon, Dr Gollan--cardiologist, Dr Patel--rheumatology, Dr Rollene Did have revision of right shoulder last August. No other hospitalizations Not able to exercise No tobacco or alcohol No falls Vision is okay--overdue for eye exam Hearing is poor---needs audiology evaluation Feels depressed all the time---due to disabilities. Does have thoughts of death, but no suicidal ideation. Gets some enjoyment from TV Able to move around in wheelchair. Can pull herself up to stand at sink--but needs her hands Can transfer on and off chair to commode Needs help with other ADLs. Daughter handles all the instrumental ADLs No sig memory issues  Dislocated her right shoulder in March Now will need revision of the total shoulder replacement--2nd revision there  Also dislocated left shoulder last year (but this is good now) Has started on pre-op iron  GFR generally in the 30's---but last up to 45  Ongoing treatment for the RA--had to stop cimzia  2 months ago (getting ready for surgery) Still on leflunomide  Out of prednisone   No palpitations No chest pain or SOB--but not able to do any activity Some edema--takes the demadex  daily No dizziness or syncope Cough is better since infection in spring No wheezing  BMI 43  Current Outpatient Medications on File Prior to Visit  Medication Sig Dispense Refill   amiodarone  (PACERONE ) 200 MG tablet TAKE 1 TABLET(200 MG) BY MOUTH DAILY 90 tablet 0   amoxicillin -clavulanate (AUGMENTIN ) 875-125 MG tablet Take 1 tablet by mouth 2 (two) times daily. (Patient taking differently: Take 1 tablet by mouth 2 (two) times daily. TAKES ONLY AS PRN AS FOR DENTAL PROCEDURES) 14 tablet 0    apixaban  (ELIQUIS ) 5 MG TABS tablet Take 1 tablet (5 mg total) by mouth 2 (two) times daily. 180 tablet 3   atorvastatin  (LIPITOR) 40 MG tablet TAKE 1 TABLET(40 MG) BY MOUTH DAILY 90 tablet 3   Certolizumab Pegol  (CIMZIA  [place]) Inject into the skin. Patient states taking 1 x per month     docusate sodium  (COLACE) 50 MG capsule Take 50 mg by mouth daily.     ketoconazole  (NIZORAL ) 2 % cream Apply 1 Application topically as needed.     leflunomide  (ARAVA ) 20 MG tablet Take 20 mg by mouth daily.     metolazone  (ZAROXOLYN ) 2.5 MG tablet TAKE 1 TABLET BY MOUTH DAILY AS NEEDED. MAY GIVE 1 TABLET AS NEEDED FOR WEIGHT GAIN OF 5 LB IN 1 WEEK (Patient taking differently: Take 2.5 mg by mouth daily as needed.) 30 tablet 5   metoprolol  succinate (TOPROL -XL) 50 MG 24 hr tablet TAKE 1 TABLET BY MOUTH EVERY DAY WITH OR IMMEDIATELY FOLLOWING A MEAL 90 tablet 3   montelukast  (SINGULAIR ) 10 MG tablet TAKE 1 TABLET BY MOUTH AT BEDTIME (Patient taking differently: Take 10 mg by mouth at bedtime.) 90 tablet 3   nitroGLYCERIN  (NITROSTAT ) 0.4 MG SL tablet Place 1 tablet (0.4 mg total) under the tongue every 5 (five) minutes as needed for chest pain. 20 tablet 12   nystatin  (MYCOSTATIN /NYSTOP ) powder Apply 1 application topically 3 (three) times daily as needed. 60 g 0   Oxycodone  HCl 10 MG TABS Take 0.5-1 tablets (5-10 mg total) by mouth 3 (three) times daily as needed. 90 tablet 0   polyethylene glycol (MIRALAX  / GLYCOLAX ) packet Take  17 g by mouth daily as needed for mild constipation. 14 each 0   potassium chloride  SA (KLOR-CON  M) 20 MEQ tablet Take 1 tablet (20 mEq total) by mouth daily. 90 tablet 3   predniSONE  (DELTASONE ) 5 MG tablet Take 5 mg by mouth daily.     senna-docusate (SENOKOT-S) 8.6-50 MG tablet Take 2 tablets by mouth at bedtime.     spironolactone  (ALDACTONE ) 25 MG tablet TAKE 1 TABLET(25 MG) BY MOUTH DAILY 90 tablet 3   terazosin  (HYTRIN ) 10 MG capsule TAKE 1 CAPSULE(10 MG) BY MOUTH AT BEDTIME 90 capsule  3   torsemide  (DEMADEX ) 20 MG tablet Take 2 tablets (40 mg total) by mouth daily. (Patient taking differently: Take 40 mg by mouth daily. TAKES 2 TABLETS AS NEEDED) 60 tablet 5   No current facility-administered medications on file prior to visit.    Allergies  Allergen Reactions   Fluoxetine  Other (See Comments)    Headache, shaking, sleep issues Headache, shaking, sleep issues   Codeine     Nausea and vomiting/only when taking too much    Past Medical History:  Diagnosis Date   (HFpEF) heart failure with preserved ejection fraction (HCC)    a.) TTE 08/10/2014: EF 55-60%, mild MAC, mild LAE, mild MR, PASP 38; b.) TTE 03/31/2019: EF 55-60%, mild LVH, mild-mod LAW, mod MAC, mild-mod AoV sclerosis, triv PR, G1DD; c.) TTE 05/29/2020: EF 60-65%, mild LVH, mod LAE, G1DD   Anemia    Aortic atherosclerosis (HCC)    Asthma    Basal cell carcinoma    CAD (coronary artery disease)    a.) LHC 05/24/2010 - nonobstructive CAD - med mgmt; b.) LHC 06/04/2020: 30% dLAD, 40% o-mLAD - med mgmt   Chronic, continuous use of opioids    Claustrophobia    COPD (chronic obstructive pulmonary disease) (HCC)    DDD (degenerative disc disease), lumbosacral    Diuretic-induced hypokalemia    Dyslipidemia    Fibromyalgia    Frequent falls    GERD (gastroesophageal reflux disease)    Gouty arthropathy    Hemihypertrophy    History of bilateral cataract extraction 2018   Hyperplastic colonic polyp 2003   Hypertension    Long term current use of amiodarone     Long term current use of anticoagulant    a.) apixaban    Long term current use of immunosuppressive drug    a.) certolizumab pegol   + predisone for RA diagnosis   MDD (major depressive disorder)    Migraines    Morbid obesity (HCC)    Nontraumatic complete tear of rotator cuff, left    OSA (obstructive sleep apnea)    a.) non-complaint with prescribed nocturnal PAP therapy   Osteoarthritis    PAF (paroxysmal atrial fibrillation) (HCC)    a.)  CHA2DS2-VASc = 5 (age, sex, CHF, HTN, vascular disease history); b.) rate and rhythm maintained on oral amiodarone  + metoprolol  succinate; chronically anticoagulated with standard dose apixaban    Pneumonia    PONV (postoperative nausea and vomiting)    Prediabetes    Rheumatoid arthritis (HCC)    a.) on certolizumab pegol  + predisone   Stage 3 chronic kidney disease (HCC)     Past Surgical History:  Procedure Laterality Date   ABDOMINAL HYSTERECTOMY     APPLICATION OF WOUND VAC Right 08/09/2017   Procedure: APPLICATION OF WOUND VAC;  Surgeon: Lilli Cough, DPM;  Location: [facility];  Service: Podiatry;  Laterality: Right;   CARDIAC CATHETERIZATION  05/24/2010   nonobstructive CAD  CATARACT EXTRACTION W/ INTRAOCULAR LENS IMPLANT Right 06/12/2016   Dr. Evalene Raw   CATARACT EXTRACTION W/ INTRAOCULAR LENS IMPLANT Left 06/26/2016   Dr. Evalene Raw   CESAREAN SECTION     CHOLECYSTECTOMY     COLONOSCOPY  06/12/2011   Procedure: COLONOSCOPY;  Surgeon: Renaye Sous, MD;  Location: [facility];  Service: Endoscopy;  Laterality: N/A;   COLONOSCOPY N/A 03/17/2013   Procedure: COLONOSCOPY;  Surgeon: Renaye Sous, MD;  Location: [facility];  Service: Endoscopy;  Laterality: N/A;   FOOT ARTHRODESIS Right 07/13/2017   Procedure: ARTHRODESIS FOOT-MULTI.FUSIONS (6 JOINTS);  Surgeon: Lilli Cough, DPM;  Location: [facility];  Service: Podiatry;  Laterality: Right;   IRRIGATION AND DEBRIDEMENT FOOT Right 08/09/2017   Procedure: IRRIGATION AND DEBRIDEMENT FOOT;  Surgeon: Lilli Cough, DPM;  Location: [facility];  Service: Podiatry;  Laterality: Right;   KNEE ARTHROSCOPY Bilateral    bilateral   LEFT HEART CATH AND CORONARY ANGIOGRAPHY N/A 06/04/2020   Procedure: LEFT HEART CATH AND CORONARY ANGIOGRAPHY;  Surgeon: Darron Deatrice LABOR, MD;  Location: [facility];  Service: Cardiovascular;  Laterality: N/A;   REVERSE SHOULDER ARTHROPLASTY Right 11/16/2020   Procedure: REVERSE  SHOULDER ARTHROPLASTY;  Surgeon: Edie Norleen PARAS, MD;  Location: [facility];  Service: Orthopedics;  Laterality: Right;   REVERSE SHOULDER ARTHROPLASTY Left 10/03/2022   Procedure: REVERSE SHOULDER ARTHROPLASTY;  Surgeon: Edie Norleen PARAS, MD;  Location: [facility];  Service: Orthopedics;  Laterality: Left;   SHOULDER CLOSED REDUCTION Right 11/11/2020   Procedure: CLOSED REDUCTION SHOULDER;  Surgeon: Edie Norleen PARAS, MD;  Location: [facility];  Service: Orthopedics;  Laterality: Right;   SHOULDER CLOSED REDUCTION Left 10/02/2022   Procedure: CLOSED REDUCTION SHOULDER;  Surgeon: Lorelle Hussar, MD;  Location: [facility];  Service: Orthopedics;  Laterality: Left;   SHOULDER INJECTION Right 11/11/2020   Procedure: SHOULDER INJECTION;  Surgeon: Edie Norleen PARAS, MD;  Location: [facility];  Service: Orthopedics;  Laterality: Right;   SINUSOTOMY     TOOTH EXTRACTION  12/2016   TOTAL SHOULDER REVISION Right 06/27/2022   Procedure: TOTAL SHOULDER REVISION;  Surgeon: Edie Norleen PARAS, MD;  Location: [facility];  Service: Orthopedics;  Laterality: Right;    Family History  Problem Relation Age of Onset   Tuberculosis Mother    Parkinsonism Mother    Emphysema Father        smoked   Arthritis Father    Cancer Sister    Diabetes type II Sister    Breast cancer Sister    Tuberculosis Sister    Breast cancer Maternal Aunt    Colon cancer Neg Hx    Esophageal cancer Neg Hx    Pancreatic cancer Neg Hx    Stomach cancer Neg Hx     Social History   Socioeconomic History   Marital status: Married    Spouse name: Eloise   Number of children: 1   Years of education: Not on file   Highest education level: Bachelor's degree (e.g., BA, AB, BS)  Occupational History   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  Tobacco Use   Smoking status: Never    Passive exposure: Past   Smokeless tobacco: Never  Vaping Use   Vaping status: Never Used  Substance and Sexual Activity   Alcohol use: No    Drug use: No   Sexual activity: Not Currently  Other Topics Concern   Not on file  Social History Narrative   No  living will   Daughter should be decision maker   Would accept resuscitation attempts--but no prolonged ventilator or tube feeds   Social Drivers of Health   Financial Resource Strain: Low Risk  (08/27/2023)   Overall Financial Resource Strain (CARDIA)    Difficulty of Paying Living Expenses: Not hard at all  Food Insecurity: No Food Insecurity (08/27/2023)   Hunger Vital Sign    Worried About Running Out of Food in the Last Year: Never true    Ran Out of Food in the Last Year: Never true  Transportation Needs: No Transportation Needs (08/27/2023)   PRAPARE - Administrator, Civil Service (Medical): No    Lack of Transportation (Non-Medical): No  Physical Activity: Inactive (08/27/2023)   Exercise Vital Sign    Days of Exercise per Week: 0 days    Minutes of Exercise per Session: Not on file  Stress: Stress Concern Present (08/27/2023)   [employer] of Occupational Health - Occupational Stress Questionnaire    Feeling of Stress: Rather much  Social Connections: Moderately Isolated (08/27/2023)   Social Connection and Isolation Panel    Frequency of Communication with Friends and Family: Twice a week    Frequency of Social Gatherings with Friends and Family: More than three times a week    Attends Religious Services: Never    Database administrator or Organizations: No    Attends Engineer, structural: Not on file    Marital Status: Married  Catering manager Violence: Not At Risk (10/02/2022)   Humiliation, Afraid, Rape, and Kick questionnaire    Fear of Current or Ex-Partner: No    Emotionally Abused: No    Physically Abused: No    Sexually Abused: No   Review of Systems Appetite is poor Weight reasonably stable Doesn't sleep well---due to shoulders and schedule issues (sits in chair all day so falls asleep at times then) Wears seat  belt Needs dental extractions---will need full dentures No heartburn or dysphagia Bowels are slow--colace helps. No blood No suspicious skin lesions---does bruise easily    Objective:   Physical Exam Constitutional:      Appearance: Normal appearance.  HENT:     Mouth/Throat:     Pharynx: No oropharyngeal exudate or posterior oropharyngeal erythema.   Eyes:     Conjunctiva/sclera: Conjunctivae normal.     Pupils: Pupils are equal, round, and reactive to light.    Cardiovascular:     Rate and Rhythm: Normal rate and regular rhythm.     Heart sounds: No murmur heard.    No gallop.     Comments: Faint pedal pulses Pulmonary:     Effort: Pulmonary effort is normal.     Breath sounds: Normal breath sounds. No wheezing or rales.  Abdominal:     Palpations: Abdomen is soft.     Tenderness: There is no abdominal tenderness.   Musculoskeletal:     Cervical back: Neck supple.     Comments: Very limited abduction of either shoulder 1+ edema right foot/trace on left  Lymphadenopathy:     Cervical: No cervical adenopathy.   Skin:    Findings: No rash.   Neurological:     General: No focal deficit present.     Mental Status: She is alert and oriented to person, place, and time.     Comments: Mini-cog--normal  Psychiatric:        Mood and Affect: Mood normal.        Behavior: Behavior normal.  Assessment & Plan:

## 2023-08-28 NOTE — Assessment & Plan Note (Signed)
 Compensated with spironolactone  25, torsemide  20-40 daily, metoprlolol

## 2023-08-28 NOTE — Patient Instructions (Signed)
 Please get a tetanus booster and RSV vaccine in the near future. You will be due for a flu and COVID vaccine update in the fall

## 2023-08-28 NOTE — Assessment & Plan Note (Signed)
 Last GFR actually improved

## 2023-08-28 NOTE — Assessment & Plan Note (Signed)
 Off cimzia  but still on leflunomide  Sees rheumatology

## 2023-08-28 NOTE — Assessment & Plan Note (Signed)
 Ongoing  Uses the oxycodone  10 tid in general

## 2023-08-28 NOTE — Assessment & Plan Note (Signed)
 I have personally reviewed the Medicare Annual Wellness questionnaire and have noted 1. The patient's medical and social history 2. Their use of alcohol, tobacco or illicit drugs 3. Their current medications and supplements 4. The patient's functional ability including ADL's, fall risks, home safety risks and hearing or visual             impairment. 5. Diet and physical activities 6. Evidence for depression or mood disorders  The patients weight, height, BMI and visual acuity have been recorded in the chart I have made referrals, counseling and provided education to the patient based review of the above and I have provided the pt with a written personalized care plan for preventive services.  I have provided you with a copy of your personalized plan for preventive services. Please take the time to review along with your updated medication list.  Not able to exercise Due for Td/RSV now Flu/COVID vaccines in the fall Will do cologuard Mammogram as soon as able--not able to lift arms now

## 2023-08-28 NOTE — Assessment & Plan Note (Signed)
 Chronic depression ---recurrent MDD vis mostly reactive Medications haven't really helped

## 2023-08-28 NOTE — Assessment & Plan Note (Signed)
 Regular today On eliquis  5 bid Metoprolol  50 daily and amiodarone 

## 2023-08-28 NOTE — Assessment & Plan Note (Signed)
 Weight stable Can't exercise Discussed care with eating

## 2023-08-29 ENCOUNTER — Telehealth: Payer: Self-pay | Admitting: Internal Medicine

## 2023-08-29 ENCOUNTER — Ambulatory Visit: Attending: Cardiology | Admitting: Emergency Medicine

## 2023-08-29 DIAGNOSIS — Z01810 Encounter for preprocedural cardiovascular examination: Secondary | ICD-10-CM | POA: Diagnosis not present

## 2023-08-29 NOTE — Telephone Encounter (Signed)
 Ok to schedule with me as I see her husband. Thanks.

## 2023-08-29 NOTE — Telephone Encounter (Signed)
 Pt asked if Dr. KANDICE can accept her as a new pt since Dr. Jimmy is retiring. Pt mentioned her husband, [name] is a current pt of Dr. KANDICE. Is this TOC okay? If TOC is okay, the visit with [facility] on 10/7 needs to be cancelled. Call back # [phone]

## 2023-08-29 NOTE — Progress Notes (Signed)
 Virtual Visit via Telephone Note   Because of Tina Pacheco co-morbid illnesses, she is at least at moderate risk for complications without adequate follow up.  This format is felt to be most appropriate for this patient at this time.  Due to technical limitations with video connection (technology), today's appointment will be conducted as an audio only telehealth visit, and Tina Pacheco verbally agreed to proceed in this manner.   All issues noted in this document were discussed and addressed.  No physical exam could be performed with this format.  Evaluation Performed:  Preoperative cardiovascular risk assessment _____________   Date:  08/29/2023   Patient ID:  Tina Pacheco, Tina Pacheco 07-04-1950, MRN [id number] Patient Location:  Home Provider location:   Office  Primary Care Provider:  Jimmy Charlie FERNS, MD Primary Cardiologist:  Evalene Lunger, MD  Chief Complaint / Patient Profile   73 y.o. y/o female with a h/o nonobstructive CAD, HFpEF, paroxysmal atrial fibrillation, CKD stage III, hypertension, hyperlipidemia, anemia, COPD, rheumatoid arthritis, fibromyalgia, falls, morbid obesity, H. pylori, and OSA who is pending revision of total shoulder arthroplasty on 09/04/2023 with [facility] hospital and presents today for telephonic preoperative cardiovascular risk assessment.  History of Present Illness    Tina Pacheco is a 73 y.o. female who presents via audio/video conferencing for a telehealth visit today.  Pt was last seen in cardiology clinic on 03/19/2023 by Bernardino Bring, PA.  At that time Tina Pacheco was doing well.  The patient is now pending procedure as outlined above. Since her last visit, she denies chest pain, shortness of breath, lower extremity edema, fatigue, palpitations, melena, hematuria, hemoptysis, diaphoresis, weakness, presyncope, syncope, orthopnea, and PND.  Today patient is doing well overall.  She has no acute cardiovascular concerns or complaint.   Reports continued weight loss with no weight gain.  Has stayed compliant with medication regimen.  Denies any anginal symptoms or chest pains.  Her activity is somewhat limited in the setting of gait instability and musculoskeletal pain.  She does use her wheelchair most of the time to get around the house however is still able to complete moderate activities.  Overall she is able to complete greater than 4 METS.  Past Medical History    Past Medical History:  Diagnosis Date   (HFpEF) heart failure with preserved ejection fraction (HCC)    a.) TTE 08/10/2014: EF 55-60%, mild MAC, mild LAE, mild MR, PASP 38; b.) TTE 03/31/2019: EF 55-60%, mild LVH, mild-mod LAW, mod MAC, mild-mod AoV sclerosis, triv PR, G1DD; c.) TTE 05/29/2020: EF 60-65%, mild LVH, mod LAE, G1DD   Anemia    Aortic atherosclerosis (HCC)    Asthma    Basal cell carcinoma    CAD (coronary artery disease)    a.) LHC 05/24/2010 - nonobstructive CAD - med mgmt; b.) LHC 06/04/2020: 30% dLAD, 40% o-mLAD - med mgmt   Chronic, continuous use of opioids    Claustrophobia    COPD (chronic obstructive pulmonary disease) (HCC)    DDD (degenerative disc disease), lumbosacral    Diuretic-induced hypokalemia    Dyslipidemia    Fibromyalgia    Frequent falls    GERD (gastroesophageal reflux disease)    Gouty arthropathy    Hemihypertrophy    History of bilateral cataract extraction 2018   Hyperplastic colonic polyp 2003   Hypertension    Long term current use of amiodarone     Long term current use of anticoagulant    a.) apixaban   Long term current use of immunosuppressive drug    a.) certolizumab pegol   + predisone for RA diagnosis   MDD (major depressive disorder)    Migraines    Morbid obesity (HCC)    Nontraumatic complete tear of rotator cuff, left    OSA (obstructive sleep apnea)    a.) non-complaint with prescribed nocturnal PAP therapy   Osteoarthritis    PAF (paroxysmal atrial fibrillation) (HCC)    a.) CHA2DS2-VASc =  5 (age, sex, CHF, HTN, vascular disease history); b.) rate and rhythm maintained on oral amiodarone  + metoprolol  succinate; chronically anticoagulated with standard dose apixaban    Pneumonia    PONV (postoperative nausea and vomiting)    Prediabetes    Rheumatoid arthritis (HCC)    a.) on certolizumab pegol  + predisone   Stage 3 chronic kidney disease (HCC)    Past Surgical History:  Procedure Laterality Date   ABDOMINAL HYSTERECTOMY     APPLICATION OF WOUND VAC Right 08/09/2017   Procedure: APPLICATION OF WOUND VAC;  Surgeon: Lilli Cough, DPM;  Location: [facility];  Service: Podiatry;  Laterality: Right;   CARDIAC CATHETERIZATION  05/24/2010   nonobstructive CAD   CATARACT EXTRACTION W/ INTRAOCULAR LENS IMPLANT Right 06/12/2016   Dr. Evalene Raw   CATARACT EXTRACTION W/ INTRAOCULAR LENS IMPLANT Left 06/26/2016   Dr. Evalene Raw   CESAREAN SECTION     CHOLECYSTECTOMY     COLONOSCOPY  06/12/2011   Procedure: COLONOSCOPY;  Surgeon: Renaye Sous, MD;  Location: [facility];  Service: Endoscopy;  Laterality: N/A;   COLONOSCOPY N/A 03/17/2013   Procedure: COLONOSCOPY;  Surgeon: Renaye Sous, MD;  Location: [facility];  Service: Endoscopy;  Laterality: N/A;   FOOT ARTHRODESIS Right 07/13/2017   Procedure: ARTHRODESIS FOOT-MULTI.FUSIONS (6 JOINTS);  Surgeon: Lilli Cough, DPM;  Location: [facility];  Service: Podiatry;  Laterality: Right;   IRRIGATION AND DEBRIDEMENT FOOT Right 08/09/2017   Procedure: IRRIGATION AND DEBRIDEMENT FOOT;  Surgeon: Lilli Cough, DPM;  Location: [facility];  Service: Podiatry;  Laterality: Right;   KNEE ARTHROSCOPY Bilateral    bilateral   LEFT HEART CATH AND CORONARY ANGIOGRAPHY N/A 06/04/2020   Procedure: LEFT HEART CATH AND CORONARY ANGIOGRAPHY;  Surgeon: Darron Deatrice LABOR, MD;  Location: [facility];  Service: Cardiovascular;  Laterality: N/A;   REVERSE SHOULDER ARTHROPLASTY Right 11/16/2020   Procedure: REVERSE SHOULDER ARTHROPLASTY;   Surgeon: Edie Norleen PARAS, MD;  Location: [facility];  Service: Orthopedics;  Laterality: Right;   REVERSE SHOULDER ARTHROPLASTY Left 10/03/2022   Procedure: REVERSE SHOULDER ARTHROPLASTY;  Surgeon: Edie Norleen PARAS, MD;  Location: [facility];  Service: Orthopedics;  Laterality: Left;   SHOULDER CLOSED REDUCTION Right 11/11/2020   Procedure: CLOSED REDUCTION SHOULDER;  Surgeon: Edie Norleen PARAS, MD;  Location: [facility];  Service: Orthopedics;  Laterality: Right;   SHOULDER CLOSED REDUCTION Left 10/02/2022   Procedure: CLOSED REDUCTION SHOULDER;  Surgeon: Lorelle Hussar, MD;  Location: [facility];  Service: Orthopedics;  Laterality: Left;   SHOULDER INJECTION Right 11/11/2020   Procedure: SHOULDER INJECTION;  Surgeon: Edie Norleen PARAS, MD;  Location: [facility];  Service: Orthopedics;  Laterality: Right;   SINUSOTOMY     TOOTH EXTRACTION  12/2016   TOTAL SHOULDER REVISION Right 06/27/2022   Procedure: TOTAL SHOULDER REVISION;  Surgeon: Edie Norleen PARAS, MD;  Location: [facility];  Service: Orthopedics;  Laterality: Right;    Allergies  Allergies  Allergen Reactions   Fluoxetine  Other (See Comments)    Headache, shaking, sleep issues Headache, shaking, sleep  issues   Codeine     Nausea and vomiting/only when taking too much    Home Medications    Prior to Admission medications   Medication Sig Start Date End Date Taking? Authorizing Provider  amiodarone  (PACERONE ) 200 MG tablet TAKE 1 TABLET(200 MG) BY MOUTH DAILY 06/06/23   Gollan, Timothy J, MD  amoxicillin -clavulanate (AUGMENTIN ) 875-125 MG tablet Take 1 tablet by mouth 2 (two) times daily. Patient taking differently: Take 1 tablet by mouth 2 (two) times daily. TAKES ONLY AS PRN AS FOR DENTAL PROCEDURES 07/17/23   Jimmy Charlie FERNS, MD  apixaban  (ELIQUIS ) 5 MG TABS tablet Take 1 tablet (5 mg total) by mouth 2 (two) times daily. 08/13/23 08/12/24  Abigail Bernardino HERO, PA-C  atorvastatin  (LIPITOR) 40 MG tablet TAKE 1 TABLET(40 MG) BY MOUTH DAILY 07/11/23   Jimmy Charlie FERNS, MD  Certolizumab Pegol  (CIMZIA  [place]) Inject into the skin. Patient states taking 1 x per month    [provider]  docusate sodium  (COLACE) 50 MG capsule Take 50 mg by mouth daily.    [provider]  ketoconazole  (NIZORAL ) 2 % cream Apply 1 Application topically as needed. 10/27/20   [provider]  leflunomide  (ARAVA ) 20 MG tablet Take 20 mg by mouth daily.    [provider]  metolazone  (ZAROXOLYN ) 2.5 MG tablet TAKE 1 TABLET BY MOUTH DAILY AS NEEDED. MAY GIVE 1 TABLET AS NEEDED FOR WEIGHT GAIN OF 5 LB IN 1 WEEK Patient taking differently: Take 2.5 mg by mouth daily as needed. 08/02/22   Letvak, Richard I, MD  metoprolol  succinate (TOPROL -XL) 50 MG 24 hr tablet TAKE 1 TABLET BY MOUTH EVERY DAY WITH OR IMMEDIATELY FOLLOWING A MEAL 06/29/23   Letvak, Richard I, MD  montelukast  (SINGULAIR ) 10 MG tablet TAKE 1 TABLET BY MOUTH AT BEDTIME Patient taking differently: Take 10 mg by mouth at bedtime. 01/01/23   Letvak, Richard I, MD  nitroGLYCERIN  (NITROSTAT ) 0.4 MG SL tablet Place 1 tablet (0.4 mg total) under the tongue every 5 (five) minutes as needed for chest pain. 06/05/20   Patel, Sona, MD  nystatin  (MYCOSTATIN /NYSTOP ) powder Apply 1 application topically 3 (three) times daily as needed. 12/18/20   Medina-Vargas, Monina C, NP  Oxycodone  HCl 10 MG TABS Take 0.5-1 tablets (5-10 mg total) by mouth 3 (three) times daily as needed. 08/23/23   Jimmy Charlie FERNS, MD  polyethylene glycol (MIRALAX  / GLYCOLAX ) packet Take 17 g by mouth daily as needed for mild constipation. 07/16/17   Sainani, Vivek J, MD  potassium chloride  SA (KLOR-CON  M) 20 MEQ tablet Take 1 tablet (20 mEq total) by mouth daily. 01/09/23   Jimmy Charlie FERNS, MD  predniSONE  (DELTASONE ) 5 MG tablet TAKE 1 TABLET(5 MG) BY MOUTH DAILY WITH BREAKFAST 08/29/23   Jimmy Charlie I, MD  senna-docusate (SENOKOT-S) 8.6-50 MG tablet Take 2 tablets by mouth at bedtime. 10/06/22   Maree Hue, MD  spironolactone  (ALDACTONE ) 25  MG tablet TAKE 1 TABLET(25 MG) BY MOUTH DAILY 08/28/23   Jimmy Charlie I, MD  terazosin  (HYTRIN ) 10 MG capsule TAKE 1 CAPSULE(10 MG) BY MOUTH AT BEDTIME 06/29/23   Jimmy Charlie FERNS, MD  torsemide  (DEMADEX ) 20 MG tablet Take 1-2 tablets (20-40 mg total) by mouth daily. TAKES 2 TABLETS AS NEEDED 08/28/23   Jimmy Charlie FERNS, MD    Physical Exam    Vital Signs:  Tina Pacheco does not have vital signs available for review today.  Given telephonic nature of communication, physical exam  is limited. AAOx3. NAD. Normal affect.  Speech and respirations are unlabored.  Accessory Clinical Findings    None  Assessment & Plan    1.  Preoperative Cardiovascular Risk Assessment: According to the Revised Cardiac Risk Index (RCRI), her Perioperative Risk of Major Cardiac Event is (%): 0.9. Her Functional Capacity in METs is: 4.06 according to the Duke Activity Status Index (DASI). Therefore, based on ACC/AHA guidelines, patient would be at acceptable risk for the planned procedure without further cardiovascular testing.   The patient was advised that if she develops new symptoms prior to surgery to contact our office to arrange for a follow-up visit, and she verbalized understanding.  Per office protocol, patient can hold Eliquis  for 3 days prior to procedure.   Patient will not need bridging with Lovenox  (enoxaparin ) around procedure.  A copy of this note will be routed to requesting surgeon.  Time:   Today, I have spent 7 minutes with the patient with telehealth technology discussing medical history, symptoms, and management plan.     Tina LITTIE Louis, NP  08/29/2023, 10:33 AM

## 2023-08-30 ENCOUNTER — Other Ambulatory Visit: Payer: Self-pay | Admitting: Cardiovascular Disease

## 2023-08-30 DIAGNOSIS — I480 Paroxysmal atrial fibrillation: Secondary | ICD-10-CM

## 2023-09-04 DIAGNOSIS — Z96611 Presence of right artificial shoulder joint: Secondary | ICD-10-CM | POA: Diagnosis not present

## 2023-09-04 DIAGNOSIS — T84028A Dislocation of other internal joint prosthesis, initial encounter: Secondary | ICD-10-CM | POA: Diagnosis not present

## 2023-09-04 DIAGNOSIS — Z96619 Presence of unspecified artificial shoulder joint: Secondary | ICD-10-CM | POA: Diagnosis not present

## 2023-09-04 DIAGNOSIS — G8918 Other acute postprocedural pain: Secondary | ICD-10-CM | POA: Diagnosis not present

## 2023-09-04 DIAGNOSIS — Z4689 Encounter for fitting and adjustment of other specified devices: Secondary | ICD-10-CM | POA: Diagnosis not present

## 2023-09-07 DIAGNOSIS — T84028A Dislocation of other internal joint prosthesis, initial encounter: Secondary | ICD-10-CM | POA: Diagnosis not present

## 2023-09-07 DIAGNOSIS — Z96619 Presence of unspecified artificial shoulder joint: Secondary | ICD-10-CM | POA: Diagnosis not present

## 2023-09-10 ENCOUNTER — Telehealth: Payer: Self-pay

## 2023-09-10 NOTE — Patient Instructions (Signed)
 Visit Information  Thank you for taking time to visit with me today. Please don't hesitate to contact me if I can be of assistance to you.  Patient instructions: Notify provider for signs of infection at surgical site: fever, increase redness, drainage, swelling, pain. Wear sling at all times unless dressing/ showering as stated in your discharge instructions. Keep follow up appointment with providers Call and schedule primary care provider hospital follow up appointment. Follow activity guidelines as stated in your discharge instructions. No driving until released by surgeon Use ice intermittently over the first 48 hours after surgery and as needed thereafter. Take medications as prescribed and take antibiotic until completed   Patient verbalizes understanding of instructions and care plan provided today and agrees to view in MyChart. Active MyChart status and patient understanding of how to access instructions and care plan via MyChart confirmed with patient.     The patient has been provided with contact information for the care management team and has been advised to call with any health related questions or concerns.   Please call the care guide team at [phone] if you need to cancel or reschedule your appointment.   Please call the Suicide and Crisis Lifeline: 988 call 1-800-273-TALK (toll free, 24 hour hotline) if you are experiencing a Mental Health or Behavioral Health Crisis or need someone to talk to.  Arvin Seip RN, BSN, CCM [employer], Population Health Case Manager Phone: [phone]

## 2023-09-10 NOTE — Transitions of Care (Post Inpatient/ED Visit) (Signed)
 09/10/2023  Name: Tina Pacheco MRN: [id number] DOB: 08-07-1950  Today's TOC FU Call Status: Today's TOC FU Call Status:: Successful TOC FU Call Completed TOC FU Call Complete Date: 09/10/23 Patient's Name and Date of Birth confirmed.  Transition Care Management Follow-up Telephone Call Date of Discharge: 09/07/23 Discharge Facility: Other [facility]) Name of Other (Non[facility]) Discharge Facility: [facility] Type of Discharge: Inpatient Admission Primary Inpatient Discharge Diagnosis:: Dislocation of prosthetic joint of shoulder How have you been since you were released from the hospital?:  (patient states she is able to move around more and sleeping better since she is back home and in her recliner.  She reports having a lift chair which helps and uses a wheelchair for ambulation.) Any questions or concerns?:  (patient denies any specific questions however voices that at times she is concerned she is doing something wrong related to movement and her right surgical shoulder.  Reviewed discharge instructions with patient regarding shoulder care and sling usage.)  Items Reviewed: Did you receive and understand the discharge instructions provided?: Yes Medications obtained,verified, and reconciled?: Yes (Medications Reviewed) Any new allergies since your discharge?: No Dietary orders reviewed?: Yes Type of Diet Ordered:: Low salt, heart healthy Do you have support at home?: Yes People in Home [RPT]: spouse, child(ren), adult Name of Support/Comfort Primary Source: Tawni Haver  Medications Reviewed Today: Medications Reviewed Today     Reviewed by Arlette Schaad E, RN (Registered Nurse) on 09/10/23 at 1047  Med List Status: <None>   Medication Order Taking? Sig Documenting Provider Last Dose Status Informant  amiodarone  (PACERONE ) 200 MG tablet [id number] Yes TAKE 1 TABLET(200 MG) BY MOUTH DAILY Gollan, Timothy J, MD  Active   amoxicillin -clavulanate (AUGMENTIN )  875-125 MG tablet [id number]  Take 1 tablet by mouth 2 (two) times daily.  Patient not taking: Reported on 09/10/2023   Letvak, Richard I, MD  Active   apixaban  (ELIQUIS ) 5 MG TABS tablet [id number] Yes Take 1 tablet (5 mg total) by mouth 2 (two) times daily. Abigail Bernardino HERO, PA-C  Active   atorvastatin  (LIPITOR) 40 MG tablet [id number] Yes TAKE 1 TABLET(40 MG) BY MOUTH DAILY Letvak, Richard I, MD  Active   Certolizumab Pegol  (CIMZIA  [place]) [id number]  Inject into the skin. Patient states taking 1 x per month  Patient not taking: Reported on 09/10/2023   [provider]  Active Self  docusate sodium  (COLACE) 50 MG capsule [id number] Yes Take 50 mg by mouth daily. [provider]  Active   ketoconazole  (NIZORAL ) 2 % cream [id number] Yes Apply 1 Application topically as needed. [provider]  Active   leflunomide  (ARAVA ) 20 MG tablet [id number] Yes Take 20 mg by mouth daily. [provider]  Active   metolazone  (ZAROXOLYN ) 2.5 MG tablet [id number] Yes TAKE 1 TABLET BY MOUTH DAILY AS NEEDED. MAY GIVE 1 TABLET AS NEEDED FOR WEIGHT GAIN OF 5 LB IN 1 WEEK Letvak, Richard I, MD  Active   metoprolol  succinate (TOPROL -XL) 50 MG 24 hr tablet [id number] Yes TAKE 1 TABLET BY MOUTH EVERY DAY WITH OR IMMEDIATELY FOLLOWING A MEAL Letvak, Richard I, MD  Active   montelukast  (SINGULAIR ) 10 MG tablet [id number] Yes TAKE 1 TABLET BY MOUTH AT BEDTIME Letvak, Richard I, MD  Active   nitroGLYCERIN  (NITROSTAT ) 0.4 MG SL tablet [id number] Yes Place 1 tablet (0.4 mg total) under the tongue every 5 (five) minutes as needed for chest pain. Patel, Sona, MD  Active Self  Med Note (REGISTER, KAREN A   Tue Jun 27, 2022  6:27 AM) Hasn't needed this med  nystatin  (MYCOSTATIN /NYSTOP ) powder [id number]  Apply 1 application topically 3 (three) times daily as needed.  Patient not taking: Reported on 09/10/2023   Medina-Vargas, Monina C, NP  Active Self  Oxycodone  HCl 10 MG TABS [id number] Yes Take 0.5-1  tablets (5-10 mg total) by mouth 3 (three) times daily as needed. Letvak, Richard I, MD  Active   polyethylene glycol (MIRALAX  / GLYCOLAX ) packet [id number] Yes Take 17 g by mouth daily as needed for mild constipation. Sainani, Vivek J, MD  Active Self  potassium chloride  SA (KLOR-CON  M) 20 MEQ tablet [id number] Yes Take 1 tablet (20 mEq total) by mouth daily. Jimmy Charlie FERNS, MD  Active   predniSONE  (DELTASONE ) 5 MG tablet [id number] Yes TAKE 1 TABLET(5 MG) BY MOUTH DAILY WITH BREAKFAST Letvak, Richard I, MD  Active   senna-docusate (SENOKOT-S) 8.6-50 MG tablet [id number] Yes Take 2 tablets by mouth at bedtime. Maree Hue, MD  Active   spironolactone  (ALDACTONE ) 25 MG tablet [id number] Yes TAKE 1 TABLET(25 MG) BY MOUTH DAILY Letvak, Richard I, MD  Active   terazosin  (HYTRIN ) 10 MG capsule [id number] Yes TAKE 1 CAPSULE(10 MG) BY MOUTH AT BEDTIME Letvak, Richard I, MD  Active   torsemide  (DEMADEX ) 20 MG tablet [id number] Yes Take 1-2 tablets (20-40 mg total) by mouth daily. TAKES 2 TABLETS AS NEEDED Jimmy Charlie FERNS, MD  Active   Med List Note Cathy Oval DEL, CPhT 01/05/20 2031):              Home Care and Equipment/Supplies: Were Home Health Services Ordered?: Yes Name of Home Health Agency:: [facility] Has Agency set up a time to come to your home?:  (patient confirmed having contact phone number to [facility].) [facility] Visit Date: 09/08/23 Any new equipment or medical supplies ordered?: No  Functional Questionnaire: Do you need assistance with bathing/showering or dressing?: Yes Do you need assistance with meal preparation?: Yes Do you need assistance with eating?: Yes Do you have difficulty maintaining continence: Yes Do you need assistance with getting out of bed/getting out of a chair/moving?: No Do you have difficulty managing or taking your medications?: Yes  Follow up appointments reviewed: PCP Follow-up appointment confirmed?:   (patient states her daughter manages her appointments. RN case manager offered to set up primary care hospital follow up.  Patient declined stating her daughter will set up the hospital follow up with her primary care provider.) Specialist Hospital Follow-up appointment confirmed?: Yes Date of Specialist follow-up appointment?: 09/18/23 Follow-Up Specialty Provider:: Dr. Christopher Klifto Do you need transportation to your follow-up appointment?: No Do you understand care options if your condition(s) worsen?: Yes-patient verbalized understanding   Medication interaction notified upon medication review for Doxocycline and ferrous sulfate.  Message regarding medication interaction sent to primary care provider to notify.  PHQ 2-9 completed with patient.  Depression screening results sent to primary care provider to update. Transition of care 30 day follow up offered to patient.  Patient declined.    Arvin Seip RN, BSN, CCM [employer], Population Health Case Manager Phone: [phone]

## 2023-09-18 DIAGNOSIS — Z96611 Presence of right artificial shoulder joint: Secondary | ICD-10-CM | POA: Diagnosis not present

## 2023-09-19 ENCOUNTER — Other Ambulatory Visit: Payer: Self-pay | Admitting: Internal Medicine

## 2023-09-19 MED ORDER — OXYCODONE HCL 10 MG PO TABS: 5.0000 mg | ORAL_TABLET | Freq: Three times a day (TID) | ORAL | 0 refills | Status: DC | PRN

## 2023-09-19 NOTE — Telephone Encounter (Signed)
 Copied from CRM [id number]. Topic: Clinical - Medication Refill >> Sep 19, 2023 10:22 AM Chasity T wrote: Medication: Oxycodone  HCl 10 MG TABS   Has the patient contacted their pharmacy? Yes   This is the patient's preferred pharmacy:  [id number] - [place], [place] - [address] AT [facility] [address] [facility] [address] [place] [address] Phone: [phone] Fax: [phone]    Is this the correct pharmacy for this prescription? Yes If no, delete pharmacy and type the correct one.   Has the prescription been filled recently? Yes  Is the patient out of the medication? Yes  Has the patient been seen for an appointment in the last year OR does the patient have an upcoming appointment? Yes  Can we respond through MyChart? Yes  Agent: Please be advised that Rx refills may take up to 3 business days. We ask that you follow-up with your pharmacy.

## 2023-09-19 NOTE — Telephone Encounter (Signed)
 Name of Medication: Oxycodone  10mg  Name of Pharmacy: [facility] [address] Last [place] or Written Date and Quantity: 08-23-23 #90 Last Office Visit and Type: 08-28-23 Next Office Visit and Type: 12-04-23 Last Controlled Substance Agreement Date: 05-18-23 Last UDS: 05-18-23

## 2023-10-15 ENCOUNTER — Encounter: Payer: Self-pay | Admitting: Internal Medicine

## 2023-10-17 ENCOUNTER — Other Ambulatory Visit: Payer: Self-pay | Admitting: Internal Medicine

## 2023-10-17 MED ORDER — OXYCODONE HCL 10 MG PO TABS: 5.0000 mg | ORAL_TABLET | Freq: Three times a day (TID) | ORAL | 0 refills | Status: DC | PRN

## 2023-10-17 NOTE — Telephone Encounter (Signed)
 Copied from CRM #[id number]. Topic: Clinical - Medication Refill >> Oct 17, 2023  9:37 AM Martinique E wrote: Medication: Oxycodone  HCl 10 MG TABS  Has the patient contacted their pharmacy? No (Agent: If no, request that the patient contact the pharmacy for the refill. If patient does not wish to contact the pharmacy document the reason why and proceed with request.) (Agent: If yes, when and what did the pharmacy advise?)  This is the patient's preferred pharmacy:   [id number] - [place], [place] - [address] AT [facility] OF [address] [facility] [address] [place] [address] Phone: [phone] Fax: [phone]  Is this the correct pharmacy for this prescription? Yes If no, delete pharmacy and type the correct one.   Has the prescription been filled recently? Yes  Is the patient out of the medication? Yes  Has the patient been seen for an appointment in the last year OR does the patient have an upcoming appointment? Yes  Can we respond through MyChart? Yes  Agent: Please be advised that Rx refills may take up to 3 business days. We ask that you follow-up with your pharmacy.

## 2023-10-22 DIAGNOSIS — Z96611 Presence of right artificial shoulder joint: Secondary | ICD-10-CM | POA: Diagnosis not present

## 2023-10-22 DIAGNOSIS — Z4889 Encounter for other specified surgical aftercare: Secondary | ICD-10-CM | POA: Diagnosis not present

## 2023-10-23 DIAGNOSIS — M0579 Rheumatoid arthritis with rheumatoid factor of multiple sites without organ or systems involvement: Secondary | ICD-10-CM | POA: Diagnosis not present

## 2023-11-14 ENCOUNTER — Other Ambulatory Visit: Payer: Self-pay | Admitting: Nurse Practitioner

## 2023-11-14 MED ORDER — OXYCODONE HCL 10 MG PO TABS: 5.0000 mg | ORAL_TABLET | Freq: Three times a day (TID) | ORAL | 0 refills | Status: DC | PRN

## 2023-11-14 NOTE — Telephone Encounter (Unsigned)
 Copied from CRM [id number]. Topic: Clinical - Medication Refill >> Nov 14, 2023 11:21 AM Berneda FALCON wrote: Medication: Oxycodone  HCl 10 MG TABS  Has the patient contacted their pharmacy? No, they tell her to call us  (Agent: If no, request that the patient contact the pharmacy for the refill. If patient does not wish to contact the pharmacy document the reason why and proceed with request.) (Agent: If yes, when and what did the pharmacy advise?)  This is the patient's preferred pharmacy:  [id number] - [place], [place] - [address] AT [facility] OF [address] [facility] [address] [place] [address] Phone: [phone] Fax: [phone]   Is this the correct pharmacy for this prescription? Yes If no, delete pharmacy and type the correct one.   Has the prescription been filled recently? No  Is the patient out of the medication? No  Has the patient been seen for an appointment in the last year OR does the patient have an upcoming appointment? Yes  Can we respond through MyChart? Yes  Agent: Please be advised that Rx refills may take up to 3 business days. We ask that you follow-up with your pharmacy.

## 2023-11-14 NOTE — Telephone Encounter (Signed)
 Name of Medication: Oxycodone  10mg  Name of Pharmacy: [facility] [address] Last [place] or Written Date and Quantity: 10-17-23 #90 Last Office Visit and Type: 08-28-23 Next Office Visit and Type: 12-04-23 Last Controlled Substance Agreement Date: 05-18-23 Last UDS: 05-18-23

## 2023-11-20 DIAGNOSIS — M0579 Rheumatoid arthritis with rheumatoid factor of multiple sites without organ or systems involvement: Secondary | ICD-10-CM | POA: Diagnosis not present

## 2023-12-03 ENCOUNTER — Encounter: Payer: Self-pay | Admitting: Nurse Practitioner

## 2023-12-04 ENCOUNTER — Encounter: Admitting: Nurse Practitioner

## 2023-12-12 ENCOUNTER — Other Ambulatory Visit: Payer: Self-pay | Admitting: Nurse Practitioner

## 2023-12-12 MED ORDER — OXYCODONE HCL 10 MG PO TABS: 5.0000 mg | ORAL_TABLET | Freq: Three times a day (TID) | ORAL | 0 refills | Status: DC | PRN

## 2023-12-12 NOTE — Telephone Encounter (Unsigned)
 Copied from CRM #[id number]. Topic: Clinical - Medication Refill >> Dec 12, 2023  8:31 AM Franky GRADE wrote: Medication: Oxycodone  HCl 10 MG TABS [[id number]]  Has the patient contacted their pharmacy? No (Agent: If no, request that the patient contact the pharmacy for the refill. If patient does not wish to contact the pharmacy document the reason why and proceed with request.) (Agent: If yes, when and what did the pharmacy advise?)  This is the patient's preferred pharmacy:  [id number] - [place], [place] - [address] AT [facility] OF [address] [facility] [address] [place] [address] Phone: [phone] Fax: [phone]   Is this the correct pharmacy for this prescription? Yes If no, delete pharmacy and type the correct one.   Has the prescription been filled recently? No  Is the patient out of the medication? Yes  Has the patient been seen for an appointment in the last year OR does the patient have an upcoming appointment? Yes  Can we respond through MyChart? Yes  Agent: Please be advised that Rx refills may take up to 3 business days. We ask that you follow-up with your pharmacy.

## 2023-12-13 NOTE — Telephone Encounter (Signed)
 Rx was refilled yesterday by Kenney Roys, NP

## 2023-12-13 NOTE — Telephone Encounter (Signed)
 Copied from CRM #[id number]. Topic: Clinical - Medication Refill >> Dec 12, 2023  8:31 AM Franky GRADE wrote: Medication: Oxycodone  HCl 10 MG TABS [[id number]]  Has the patient contacted their pharmacy? No (Agent: If no, request that the patient contact the pharmacy for the refill. If patient does not wish to contact the pharmacy document the reason why and proceed with request.) (Agent: If yes, when and what did the pharmacy advise?)  This is the patient's preferred pharmacy:  [id number] - [place], [place] - [address] AT [facility] OF [address] [facility] [address] & [address] [place] [address] Phone: [phone] Fax: [phone]   Is this the correct pharmacy for this prescription? Yes If no, delete pharmacy and type the correct one.   Has the prescription been filled recently? No  Is the patient out of the medication? Yes  Has the patient been seen for an appointment in the last year OR does the patient have an upcoming appointment? Yes  Can we respond through MyChart? Yes  Agent: Please be advised that Rx refills may take up to 3 business days. We ask that you follow-up with your pharmacy. >> Dec 12, 2023  5:00 PM Delon T wrote: Checking status of refill

## 2023-12-18 ENCOUNTER — Ambulatory Visit (INDEPENDENT_AMBULATORY_CARE_PROVIDER_SITE_OTHER): Admitting: Nurse Practitioner

## 2023-12-18 VITALS — BP 118/76 | HR 48 | Temp 98.3°F | Ht 63.0 in | Wt 225.0 lb

## 2023-12-18 DIAGNOSIS — N1832 Chronic kidney disease, stage 3b: Secondary | ICD-10-CM

## 2023-12-18 DIAGNOSIS — M0579 Rheumatoid arthritis with rheumatoid factor of multiple sites without organ or systems involvement: Secondary | ICD-10-CM | POA: Diagnosis not present

## 2023-12-18 DIAGNOSIS — B372 Candidiasis of skin and nail: Secondary | ICD-10-CM

## 2023-12-18 DIAGNOSIS — R7989 Other specified abnormal findings of blood chemistry: Secondary | ICD-10-CM

## 2023-12-18 DIAGNOSIS — R350 Frequency of micturition: Secondary | ICD-10-CM | POA: Diagnosis not present

## 2023-12-18 DIAGNOSIS — Z1231 Encounter for screening mammogram for malignant neoplasm of breast: Secondary | ICD-10-CM

## 2023-12-18 DIAGNOSIS — G894 Chronic pain syndrome: Secondary | ICD-10-CM

## 2023-12-18 DIAGNOSIS — I5032 Chronic diastolic (congestive) heart failure: Secondary | ICD-10-CM

## 2023-12-18 DIAGNOSIS — I480 Paroxysmal atrial fibrillation: Secondary | ICD-10-CM

## 2023-12-18 DIAGNOSIS — Z780 Asymptomatic menopausal state: Secondary | ICD-10-CM

## 2023-12-18 DIAGNOSIS — M069 Rheumatoid arthritis, unspecified: Secondary | ICD-10-CM

## 2023-12-18 DIAGNOSIS — I10 Essential (primary) hypertension: Secondary | ICD-10-CM

## 2023-12-18 DIAGNOSIS — F1120 Opioid dependence, uncomplicated: Secondary | ICD-10-CM

## 2023-12-18 LAB — CBC
HCT: 32.7 % — ABNORMAL LOW (ref 36.0–46.0)
Hemoglobin: 10.5 g/dL — ABNORMAL LOW (ref 12.0–15.0)
MCHC: 32 g/dL (ref 30.0–36.0)
MCV: 84.9 fl (ref 78.0–100.0)
Platelets: 190 K/uL (ref 150.0–400.0)
RBC: 3.85 Mil/uL — ABNORMAL LOW (ref 3.87–5.11)
RDW: 18.8 % — ABNORMAL HIGH (ref 11.5–15.5)
WBC: 5.1 K/uL (ref 4.0–10.5)

## 2023-12-18 LAB — IBC + FERRITIN
Ferritin: 59 ng/mL (ref 10.0–291.0)
Iron: 26 ug/dL — ABNORMAL LOW (ref 42–145)
Saturation Ratios: 9 % — ABNORMAL LOW (ref 20.0–50.0)
TIBC: 288.4 ug/dL (ref 250.0–450.0)
Transferrin: 206 mg/dL — ABNORMAL LOW (ref 212.0–360.0)

## 2023-12-18 LAB — COMPREHENSIVE METABOLIC PANEL WITH GFR
ALT: 11 U/L (ref 0–35)
AST: 14 U/L (ref 0–37)
Albumin: 3.8 g/dL (ref 3.5–5.2)
Alkaline Phosphatase: 92 U/L (ref 39–117)
BUN: 25 mg/dL — ABNORMAL HIGH (ref 6–23)
CO2: 28 meq/L (ref 19–32)
Calcium: 10.3 mg/dL (ref 8.4–10.5)
Chloride: 99 meq/L (ref 96–112)
Creatinine, Ser: 1.15 mg/dL (ref 0.40–1.20)
GFR: 47.14 mL/min — ABNORMAL LOW (ref >60.00)
Glucose, Bld: 94 mg/dL (ref 70–99)
Potassium: 3.9 meq/L (ref 3.5–5.1)
Sodium: 135 meq/L (ref 135–145)
Total Bilirubin: 0.9 mg/dL (ref 0.2–1.2)
Total Protein: 6.8 g/dL (ref 6.0–8.3)

## 2023-12-18 MED ORDER — NYSTATIN 100000 UNIT/GM EX POWD: 1.0000 | Freq: Three times a day (TID) | CUTANEOUS | 0 refills | Status: AC | PRN

## 2023-12-18 MED ORDER — MONTELUKAST SODIUM 10 MG PO TABS: 10.0000 mg | ORAL_TABLET | Freq: Every day | ORAL | 3 refills | Status: AC

## 2023-12-18 NOTE — Patient Instructions (Addendum)
 Nice to see you today  I will be in touch with the labs once I have them Follow up with me in 3 months, sooner If you need me    Call and schedule the Bone density scan and mammogram  [facility] [address] ( on hospital grounds) [place], [place]  [phone]

## 2023-12-18 NOTE — Progress Notes (Signed)
 Established Patient Office Visit  Subjective   Patient ID: Tina Pacheco, female    DOB: 1950/09/28  Age: 73 y.o. MRN: [id number]  Chief Complaint  Patient presents with   Transitions Of Care    HPI  A-fib: Currently maintained on Eliquis  5 mg twice daily, amiodarone  200 mg daily, metoprolol  50 mg daily patient is followed by cardiology  RA: Currently maintained on leflunomide  20 mg daily and Cimzia .  Patient is followed by rheumatology  Heart failure: Currently maintained on torsemide  20 to 40 mg daily as needed, spironolactone  25 mg daily, metoprolol  50 mg daily  Chronic pain: Patient currently maintained on oxycodone  5 to 10 mg 3 times daily as needed pain.  HTN: Patient currently on terazosin  2 mg nightly, spironolactone  25 mg daily, metoprolol  50 mg daily, torsemide  20 to 40 mg daily as needed   Hx of anemia: hx of the same she has tried the oral iron and hs not been able to tolerate it  States that she is having urgency and canno thold her urine. She is on diuretic. Stats that she does take it in the morning and will still need to get up and go to the bathroom almost every 2 hours at night   Tdap:over due get at pharmacy  Flu: needs to wait due to her cimzia  injection  Pneumonia: Prevnar 20 due COVID: Original with booster Shingles: Zostavax live  Colonoscopy:03/17/2013, repeat in 10 years.  Patient due 2025. Has cologuard at home they are trying  Mammogram:ordered to attempt. May not be able to do due to limited ROM of bilateral upper extremities Pap smear: Aged out/hysterectomy DEXA scan:?    Review of Systems  Constitutional:  Negative for chills and fever.  Respiratory:  Negative for shortness of breath.   Cardiovascular:  Negative for chest pain.  Gastrointestinal:  Negative for abdominal pain, constipation, diarrhea, nausea and vomiting.       Bm once a week. She does not have a good appetite  Musculoskeletal:  Positive for joint pain.  Neurological:   Negative for dizziness and headaches.  Psychiatric/Behavioral:  Negative for hallucinations and suicidal ideas.       Objective:     BP 118/76   Pulse (!) 48   Temp 98.3 F (36.8 C) (Oral)   Ht 5' 3 (1.6 m)   Wt 225 lb (102.1 kg)   SpO2 92%   BMI 39.86 kg/m  BP Readings from Last 3 Encounters:  12/18/23 118/76  08/28/23 132/74  07/17/23 120/74   Wt Readings from Last 3 Encounters:  12/18/23 225 lb (102.1 kg)  09/10/23 238 lb (108 kg)  08/28/23 237 lb (107.5 kg)   SpO2 Readings from Last 3 Encounters:  12/18/23 92%  08/28/23 95%  07/17/23 95%      Physical Exam Vitals and nursing note reviewed.  Constitutional:      Appearance: Normal appearance.  Cardiovascular:     Rate and Rhythm: Normal rate and regular rhythm.     Heart sounds: Normal heart sounds.  Pulmonary:     Effort: Pulmonary effort is normal.     Breath sounds: Normal breath sounds.  Abdominal:     General: Bowel sounds are normal.     Tenderness: There is no abdominal tenderness.  Musculoskeletal:     Right lower leg: Edema present.     Left lower leg: Edema present.     Comments: Severe limited ROM of BUE  Neurological:     Mental Status: She  is alert.      No results found for any visits on 12/18/23.    The ASCVD Risk score (Arnett DK, et al., 2019) failed to calculate for the following reasons:   Risk score cannot be calculated because patient has a medical history suggesting prior/existing ASCVD    Assessment & Plan:   Problem List Items Addressed This Visit       Cardiovascular and Mediastinum   HTN (hypertension)   Currently maintained on terazosin  10 mg nightly, spironolactone  25 mg daily, metoprolol  50 mg daily, torsemide  20 to 40 mg daily as needed.  Blood pressure controlled continue medication as prescribed      Paroxysmal A-fib (HCC)   History of the same.  Patient currently maintained on Eliquis  5 mg twice daily, amiodarone  200 mg daily, metoprolol  50 mg daily.  She  is followed by cardiology.  Heart regular in office today      Chronic diastolic CHF (congestive heart failure) (HCC)   Followed by cardiology currently maintained on metoprolol  50 mg daily, torsemide  20 to 40 mg daily as needed, spironolactone  25 mg daily        Musculoskeletal and Integument   Rheumatoid arthritis, unspecified (HCC)   History of the same.  Patient currently maintained on leflunomide  20 mg daily and Cimzia  infusion.  Patient is followed by rheumatology.  Continue taking medication as prescribed follow-up with rheumatology as recommended        Genitourinary   Stage 3b chronic kidney disease (HCC)   History of the same pending repeat labs today        Other   Chronic pain syndrome   Chronic pain syndrome and rheumatoid arthritis.  Patient currently maintained on oxycodone  5 to 10 mg 3 times daily as needed.  Continue      Narcotic dependence (HCC)   Currently maintained on oxycodone  5 to 10 mg 3 times daily as needed.  Stable no red flags in office continue medication as prescribed      Urinary frequency - Primary   Likely LAD.  Will check UA with reflex to culture.  Ambulatory referral to neurology for further workup and management      Relevant Orders   Ambulatory referral to Urology   Urinalysis w microscopic + reflex cultur   Abnormal CBC   History of the same patient was told to start iron supplementation.  She has tried oral iron supplementation and failed.  Repeat labs today consider hematology referral for iron infusions      Relevant Orders   CBC   IBC + Ferritin   Comprehensive metabolic panel with GFR   Other Visit Diagnoses       Candidal skin infection       Relevant Medications   nystatin  (MYCOSTATIN /NYSTOP ) powder     Post-menopausal       Relevant Orders   DG Bone Density     Screening mammogram for breast cancer       Relevant Orders   MM 3D SCREENING MAMMOGRAM BILATERAL BREAST       Return in about 3 months (around  03/19/2024) for Chronic pain , BP recheck.    Adina Crandall, NP

## 2023-12-18 NOTE — Assessment & Plan Note (Signed)
 Chronic pain syndrome and rheumatoid arthritis.  Patient currently maintained on oxycodone  5 to 10 mg 3 times daily as needed.  Continue

## 2023-12-18 NOTE — Assessment & Plan Note (Signed)
 History of the same patient was told to start iron supplementation.  She has tried oral iron supplementation and failed.  Repeat labs today consider hematology referral for iron infusions

## 2023-12-18 NOTE — Assessment & Plan Note (Signed)
 History of the same.  Patient currently maintained on leflunomide  20 mg daily and Cimzia  infusion.  Patient is followed by rheumatology.  Continue taking medication as prescribed follow-up with rheumatology as recommended

## 2023-12-18 NOTE — Assessment & Plan Note (Signed)
 Currently maintained on oxycodone  5 to 10 mg 3 times daily as needed.  Stable no red flags in office continue medication as prescribed

## 2023-12-18 NOTE — Assessment & Plan Note (Signed)
 History of the same.  Patient currently maintained on Eliquis  5 mg twice daily, amiodarone  200 mg daily, metoprolol  50 mg daily.  She is followed by cardiology.  Heart regular in office today

## 2023-12-18 NOTE — Assessment & Plan Note (Signed)
 Followed by cardiology currently maintained on metoprolol  50 mg daily, torsemide  20 to 40 mg daily as needed, spironolactone  25 mg daily

## 2023-12-18 NOTE — Assessment & Plan Note (Signed)
 Likely LAD.  Will check UA with reflex to culture.  Ambulatory referral to neurology for further workup and management

## 2023-12-18 NOTE — Assessment & Plan Note (Signed)
 History of the same pending repeat labs today

## 2023-12-18 NOTE — Assessment & Plan Note (Signed)
 Currently maintained on terazosin  10 mg nightly, spironolactone  25 mg daily, metoprolol  50 mg daily, torsemide  20 to 40 mg daily as needed.  Blood pressure controlled continue medication as prescribed

## 2023-12-19 ENCOUNTER — Ambulatory Visit: Payer: Self-pay | Admitting: Nurse Practitioner

## 2023-12-19 LAB — URINALYSIS W MICROSCOPIC + REFLEX CULTURE
Bacteria, UA: NONE SEEN /HPF
Bilirubin Urine: NEGATIVE
Glucose, UA: NEGATIVE
Hgb urine dipstick: NEGATIVE
Hyaline Cast: NONE SEEN /LPF
Ketones, ur: NEGATIVE
Leukocyte Esterase: NEGATIVE
Nitrites, Initial: NEGATIVE
RBC / HPF: NONE SEEN /HPF (ref 0–2)
Specific Gravity, Urine: 1.016 (ref 1.001–1.035)
WBC, UA: NONE SEEN /HPF (ref 0–5)
pH: 6.5 (ref 5.0–8.0)

## 2023-12-19 LAB — NO CULTURE INDICATED

## 2023-12-21 DIAGNOSIS — Z79899 Other long term (current) drug therapy: Secondary | ICD-10-CM | POA: Diagnosis not present

## 2023-12-21 DIAGNOSIS — M150 Primary generalized (osteo)arthritis: Secondary | ICD-10-CM | POA: Diagnosis not present

## 2023-12-21 DIAGNOSIS — M0579 Rheumatoid arthritis with rheumatoid factor of multiple sites without organ or systems involvement: Secondary | ICD-10-CM | POA: Diagnosis not present

## 2023-12-25 ENCOUNTER — Encounter: Payer: Self-pay | Admitting: Nurse Practitioner

## 2023-12-26 NOTE — Telephone Encounter (Signed)
 Script has been faxed to [phone]  Transmission log received.

## 2023-12-26 NOTE — Telephone Encounter (Signed)
 Hand written script for ramp given to Janae T, CMA. Can we fax to the information on the mychart message

## 2024-01-02 ENCOUNTER — Encounter: Payer: Self-pay | Admitting: Urology

## 2024-01-02 ENCOUNTER — Ambulatory Visit (INDEPENDENT_AMBULATORY_CARE_PROVIDER_SITE_OTHER): Admitting: Urology

## 2024-01-02 VITALS — BP 189/78 | HR 60 | Ht 63.0 in | Wt 228.0 lb

## 2024-01-02 DIAGNOSIS — R351 Nocturia: Secondary | ICD-10-CM

## 2024-01-02 DIAGNOSIS — N3941 Urge incontinence: Secondary | ICD-10-CM | POA: Diagnosis not present

## 2024-01-02 DIAGNOSIS — N3281 Overactive bladder: Secondary | ICD-10-CM

## 2024-01-02 DIAGNOSIS — R350 Frequency of micturition: Secondary | ICD-10-CM | POA: Diagnosis not present

## 2024-01-02 LAB — URINALYSIS, COMPLETE
Bilirubin, UA: NEGATIVE
Glucose, UA: NEGATIVE
Ketones, UA: NEGATIVE
Leukocytes,UA: NEGATIVE
Nitrite, UA: NEGATIVE
Protein,UA: NEGATIVE
RBC, UA: NEGATIVE
Specific Gravity, UA: 1.015 (ref 1.005–1.030)
Urobilinogen, Ur: 0.2 mg/dL (ref 0.2–1.0)
pH, UA: 6 (ref 5.0–7.5)

## 2024-01-02 LAB — MICROSCOPIC EXAMINATION: Epithelial Cells (non renal): >10 /HPF — AB (ref 0–10)

## 2024-01-02 LAB — BLADDER SCAN AMB NON-IMAGING: Scan Result: 0

## 2024-01-02 MED ORDER — GEMTESA 75 MG PO TABS: 75.0000 mg | ORAL_TABLET | Freq: Every day | ORAL | Status: DC

## 2024-01-02 NOTE — Progress Notes (Signed)
 01/02/2024 9:14 AM   Tina Pacheco [birth year] [id number]  Referring provider: Wendee Lynwood HERO, NP [address] [place],  [place] [address]  Chief Complaint  Patient presents with   Urinary Frequency    HPI: Tina Pacheco is a 73 y.o. female referred for evaluation of urinary frequency.  She presents today with her daughter.  ~10 year history of bothersome urinary symptoms including urinary frequency, urgency with urge incontinence.  She will also experience unaware incontinence No prior history of Parkinson's, MS.  History degenerative disc disease in the lumbosacral area No prior management of her lower urinary tract symptoms or prior urologic evaluation Current complaints are day and nighttime frequency estimated every 3 hours with episodes of urge incontinence.  Currently managed with pads and a PM diaper Denies recurrent UTI, gross hematuria Decreased mobility secondary to rheumatoid arthritis and degenerative joint disease and in a wheelchair No pelvic/abdominal pain On torsemide  which exacerbates her symptoms Has been diagnosed with OSA but not using CPAP device   PMH: Past Medical History:  Diagnosis Date   (HFpEF) heart failure with preserved ejection fraction (HCC)    a.) TTE 08/10/2014: EF 55-60%, mild MAC, mild LAE, mild MR, PASP 38; b.) TTE 03/31/2019: EF 55-60%, mild LVH, mild-mod LAW, mod MAC, mild-mod AoV sclerosis, triv PR, G1DD; c.) TTE 05/29/2020: EF 60-65%, mild LVH, mod LAE, G1DD   Anemia    Aortic atherosclerosis    Asthma    Basal cell carcinoma    CAD (coronary artery disease)    a.) LHC 05/24/2010 - nonobstructive CAD - med mgmt; b.) LHC 06/04/2020: 30% dLAD, 40% o-mLAD - med mgmt   Chronic, continuous use of opioids    Claustrophobia    COPD (chronic obstructive pulmonary disease) (HCC)    DDD (degenerative disc disease), lumbosacral    Diuretic-induced hypokalemia    Dyslipidemia    Fibromyalgia    Frequent falls    GERD  (gastroesophageal reflux disease)    Gouty arthropathy    Hemihypertrophy    History of bilateral cataract extraction 2018   Hyperplastic colonic polyp 2003   Hypertension    Long term current use of amiodarone     Long term current use of anticoagulant    a.) apixaban    Long term current use of immunosuppressive drug    a.) certolizumab pegol   + predisone for RA diagnosis   MDD (major depressive disorder)    Migraines    Morbid obesity (HCC)    Nontraumatic complete tear of rotator cuff, left    OSA (obstructive sleep apnea)    a.) non-complaint with prescribed nocturnal PAP therapy   Osteoarthritis    PAF (paroxysmal atrial fibrillation) (HCC)    a.) CHA2DS2-VASc = 5 (age, sex, CHF, HTN, vascular disease history); b.) rate and rhythm maintained on oral amiodarone  + metoprolol  succinate; chronically anticoagulated with standard dose apixaban    Pneumonia    PONV (postoperative nausea and vomiting)    Prediabetes    Rheumatoid arthritis (HCC)    a.) on certolizumab pegol  + predisone   Stage 3 chronic kidney disease (HCC)     Surgical History: Past Surgical History:  Procedure Laterality Date   ABDOMINAL HYSTERECTOMY     APPLICATION OF WOUND VAC Right 08/09/2017   Procedure: APPLICATION OF WOUND VAC;  Surgeon: Lilli Cough, DPM;  Location: [facility];  Service: Podiatry;  Laterality: Right;   CARDIAC CATHETERIZATION  05/24/2010   nonobstructive CAD   CATARACT EXTRACTION W/ INTRAOCULAR LENS IMPLANT  Right 06/12/2016   Dr. Evalene Raw   CATARACT EXTRACTION W/ INTRAOCULAR LENS IMPLANT Left 06/26/2016   Dr. Evalene Raw   CESAREAN SECTION     CHOLECYSTECTOMY     COLONOSCOPY  06/12/2011   Procedure: COLONOSCOPY;  Surgeon: Renaye Sous, MD;  Location: [facility];  Service: Endoscopy;  Laterality: N/A;   COLONOSCOPY N/A 03/17/2013   Procedure: COLONOSCOPY;  Surgeon: Renaye Sous, MD;  Location: [facility];  Service: Endoscopy;  Laterality: N/A;   FOOT ARTHRODESIS Right  07/13/2017   Procedure: ARTHRODESIS FOOT-MULTI.FUSIONS (6 JOINTS);  Surgeon: Lilli Cough, DPM;  Location: [facility];  Service: Podiatry;  Laterality: Right;   IRRIGATION AND DEBRIDEMENT FOOT Right 08/09/2017   Procedure: IRRIGATION AND DEBRIDEMENT FOOT;  Surgeon: Lilli Cough, DPM;  Location: [facility];  Service: Podiatry;  Laterality: Right;   KNEE ARTHROSCOPY Bilateral    bilateral   LEFT HEART CATH AND CORONARY ANGIOGRAPHY N/A 06/04/2020   Procedure: LEFT HEART CATH AND CORONARY ANGIOGRAPHY;  Surgeon: Darron Deatrice LABOR, MD;  Location: [facility];  Service: Cardiovascular;  Laterality: N/A;   REVERSE SHOULDER ARTHROPLASTY Right 11/16/2020   Procedure: REVERSE SHOULDER ARTHROPLASTY;  Surgeon: Edie Norleen PARAS, MD;  Location: [facility];  Service: Orthopedics;  Laterality: Right;   REVERSE SHOULDER ARTHROPLASTY Left 10/03/2022   Procedure: REVERSE SHOULDER ARTHROPLASTY;  Surgeon: Edie Norleen PARAS, MD;  Location: [facility];  Service: Orthopedics;  Laterality: Left;   SHOULDER CLOSED REDUCTION Right 11/11/2020   Procedure: CLOSED REDUCTION SHOULDER;  Surgeon: Edie Norleen PARAS, MD;  Location: [facility];  Service: Orthopedics;  Laterality: Right;   SHOULDER CLOSED REDUCTION Left 10/02/2022   Procedure: CLOSED REDUCTION SHOULDER;  Surgeon: Lorelle Hussar, MD;  Location: [facility];  Service: Orthopedics;  Laterality: Left;   SHOULDER INJECTION Right 11/11/2020   Procedure: SHOULDER INJECTION;  Surgeon: Edie Norleen PARAS, MD;  Location: [facility];  Service: Orthopedics;  Laterality: Right;   SHOULDER SURGERY Right 08/2023   SINUSOTOMY     TOOTH EXTRACTION  12/2016   TOTAL SHOULDER REVISION Right 06/27/2022   Procedure: TOTAL SHOULDER REVISION;  Surgeon: Edie Norleen PARAS, MD;  Location: [facility];  Service: Orthopedics;  Laterality: Right;    Home Medications:  Allergies as of 01/02/2024       Reactions   Fluoxetine  Other (See Comments)   Headache, shaking, sleep issues Headache, shaking, sleep  issues   Codeine    Nausea and vomiting/only when taking too much        Medication List        Accurate as of January 02, 2024  9:14 AM. If you have any questions, ask your nurse or doctor.          STOP taking these medications    amoxicillin -clavulanate 875-125 MG tablet Commonly known as: AUGMENTIN  Stopped by: Glendia JAYSON Barba       TAKE these medications    amiodarone  200 MG tablet Commonly known as: PACERONE  TAKE 1 TABLET(200 MG) BY MOUTH DAILY   apixaban  5 MG Tabs tablet Commonly known as: Eliquis  Take 1 tablet (5 mg total) by mouth 2 (two) times daily.   atorvastatin  40 MG tablet Commonly known as: LIPITOR TAKE 1 TABLET(40 MG) BY MOUTH DAILY   CIMZIA  [place] Inject into the skin. Patient states taking 1 x per month   docusate sodium  50 MG capsule Commonly known as: COLACE Take 50 mg by mouth daily.   Gemtesa 75 MG Tabs Generic drug: Vibegron Take 1 tablet (75 mg total) by  mouth daily. Started by: Glendia JAYSON Barba   leflunomide  20 MG tablet Commonly known as: ARAVA  Take 20 mg by mouth daily.   metolazone  2.5 MG tablet Commonly known as: ZAROXOLYN  TAKE 1 TABLET BY MOUTH DAILY AS NEEDED. MAY GIVE 1 TABLET AS NEEDED FOR WEIGHT GAIN OF 5 LB IN 1 WEEK   metoprolol  succinate 50 MG 24 hr tablet Commonly known as: TOPROL -XL TAKE 1 TABLET BY MOUTH EVERY DAY WITH OR IMMEDIATELY FOLLOWING A MEAL   montelukast  10 MG tablet Commonly known as: SINGULAIR  Take 1 tablet (10 mg total) by mouth at bedtime.   nystatin  powder Commonly known as: MYCOSTATIN /NYSTOP  Apply 1 Application topically 3 (three) times daily as needed.   Oxycodone  HCl 10 MG Tabs Take 0.5-1 tablets (5-10 mg total) by mouth 3 (three) times daily as needed.   polyethylene glycol 17 g packet Commonly known as: MIRALAX  / GLYCOLAX  Take 17 g by mouth daily as needed for mild constipation.   potassium chloride  SA 20 MEQ tablet Commonly known as: KLOR-CON  M Take 1 tablet (20 mEq total) by mouth  daily.   predniSONE  5 MG tablet Commonly known as: DELTASONE  TAKE 1 TABLET(5 MG) BY MOUTH DAILY WITH BREAKFAST   senna-docusate 8.6-50 MG tablet Commonly known as: Senokot-S Take 2 tablets by mouth at bedtime.   spironolactone  25 MG tablet Commonly known as: ALDACTONE  TAKE 1 TABLET(25 MG) BY MOUTH DAILY   terazosin  10 MG capsule Commonly known as: HYTRIN  TAKE 1 CAPSULE(10 MG) BY MOUTH AT BEDTIME   torsemide  20 MG tablet Commonly known as: DEMADEX  Take 1-2 tablets (20-40 mg total) by mouth daily. TAKES 2 TABLETS AS NEEDED        Allergies:  Allergies  Allergen Reactions   Fluoxetine  Other (See Comments)    Headache, shaking, sleep issues Headache, shaking, sleep issues   Codeine     Nausea and vomiting/only when taking too much    Family History: Family History  Problem Relation Age of Onset   Tuberculosis Mother    Parkinsonism Mother    Emphysema Father        smoked   Arthritis Father    Cancer Sister    Diabetes type II Sister    Breast cancer Sister    Tuberculosis Sister    Breast cancer Maternal Aunt    Colon cancer Neg Hx    Esophageal cancer Neg Hx    Pancreatic cancer Neg Hx    Stomach cancer Neg Hx     Social History:  reports that she has never smoked. She has been exposed to tobacco smoke. She has never used smokeless tobacco. She reports that she does not drink alcohol and does not use drugs.   Physical Exam: BP (!) 189/78   Pulse 60   Ht 5' 3 (1.6 m)   Wt 228 lb (103.4 kg)   BMI 40.39 kg/m   Constitutional:  Alert, No acute distress. HEENT: [place] AT Respiratory: Normal respiratory effort, no increased work of breathing. Psychiatric: Normal mood and affect.  Laboratory Data:  Urinalysis Dipstick negative Microscopy negative WBC/RBC; >10 epis   Pertinent Imaging: CT abdomen/pelvis 03/2021 showed no upper tract abnormalities.   Assessment & Plan:    1.  Overactive bladder with urge incontinence PVR today 0 mL Urinalysis  unremarkable Trial Gemtesa 75 mg daily-samples given.  If effective but cost prohibitive we will try an anticholinergic PA follow-up 1 month symptom recheck/PVR  2.  Nocturia We discussed sleep apnea as a common cause of  nocturia and if symptoms do not improve on mid recommend using CPAP   Glendia JAYSON Barba, MD  [facility] [address] [place], [place] [address] [phone]

## 2024-01-10 ENCOUNTER — Other Ambulatory Visit: Payer: Self-pay | Admitting: Nurse Practitioner

## 2024-01-10 MED ORDER — OXYCODONE HCL 10 MG PO TABS: 5.0000 mg | ORAL_TABLET | Freq: Three times a day (TID) | ORAL | 0 refills | Status: DC | PRN

## 2024-01-10 NOTE — Telephone Encounter (Signed)
 Copied from CRM [id number]. Topic: Clinical - Medication Refill >> Jan 10, 2024  8:48 AM Tina Pacheco wrote: Medication: Oxycodone  HCl 10 MG TABS [[id number]]  Has the patient contacted their pharmacy? No (Agent: If no, request that the patient contact the pharmacy for the refill. If patient does not wish to contact the pharmacy document the reason why and proceed with request.) (Agent: If yes, when and what did the pharmacy advise?)  This is the patient's preferred pharmacy:  [id number] - [place], [place] - [address] AT [facility] OF [address] [facility] [address] [place] [address] Phone: [phone] Fax: [phone]   Is this the correct pharmacy for this prescription? Yes If no, delete pharmacy and type the correct one.   Has the prescription been filled recently? No  Is the patient out of the medication? Yes  Has the patient been seen for an appointment in the last year OR does the patient have an upcoming appointment? Yes  Can we respond through MyChart? Yes  Agent: Please be advised that Rx refills may take up to 3 business days. We ask that you follow-up with your pharmacy.

## 2024-01-15 DIAGNOSIS — M0579 Rheumatoid arthritis with rheumatoid factor of multiple sites without organ or systems involvement: Secondary | ICD-10-CM | POA: Diagnosis not present

## 2024-01-15 DIAGNOSIS — B351 Tinea unguium: Secondary | ICD-10-CM | POA: Diagnosis not present

## 2024-01-29 DIAGNOSIS — M0579 Rheumatoid arthritis with rheumatoid factor of multiple sites without organ or systems involvement: Secondary | ICD-10-CM | POA: Diagnosis not present

## 2024-01-30 DIAGNOSIS — Z96611 Presence of right artificial shoulder joint: Secondary | ICD-10-CM | POA: Diagnosis not present

## 2024-01-30 DIAGNOSIS — Z4889 Encounter for other specified surgical aftercare: Secondary | ICD-10-CM | POA: Diagnosis not present

## 2024-02-01 ENCOUNTER — Ambulatory Visit: Admitting: Physician Assistant

## 2024-02-04 ENCOUNTER — Telehealth: Payer: Self-pay | Admitting: Physician Assistant

## 2024-02-04 NOTE — Telephone Encounter (Signed)
 Patient's daughter returned call to reschedule patient appointment for tomorrow due to inclement weather. Daughter would like to know if patient can get samples of Gemtesa  until her next scheduled appointment on Thursday 02/07/24. Please advise.

## 2024-02-05 ENCOUNTER — Ambulatory Visit: Admitting: Physician Assistant

## 2024-02-06 ENCOUNTER — Other Ambulatory Visit: Payer: Self-pay | Admitting: Nurse Practitioner

## 2024-02-06 MED ORDER — OXYCODONE HCL 10 MG PO TABS: 5.0000 mg | ORAL_TABLET | Freq: Three times a day (TID) | ORAL | 0 refills | Status: DC | PRN

## 2024-02-06 NOTE — Telephone Encounter (Signed)
 Copied from CRM #[id number]. Topic: Clinical - Medication Refill >> Feb 06, 2024  8:22 AM Tina Pacheco wrote: Medication: oxycodone   Has the patient contacted their pharmacy? Yes (Agent: If no, request that the patient contact the pharmacy for the refill. If patient does not wish to contact the pharmacy document the reason why and proceed with request.) (Agent: If yes, when and what did the pharmacy advise?)  This is the patient's preferred pharmacy:  [id number] - [place], [place] - [address] AT [facility] OF [address] [facility] [address] [place] [address] Phone: [phone] Fax: [phone]  Is this the correct pharmacy for this prescription? Yes If no, delete pharmacy and type the correct one.   Has the prescription been filled recently? Yes  Is the patient out of the medication? Yes  Has the patient been seen for an appointment in the last year OR does the patient have an upcoming appointment? Yes  Can we respond through MyChart? Yes  Agent: Please be advised that Rx refills may take up to 3 business days. We ask that you follow-up with your pharmacy.

## 2024-02-07 ENCOUNTER — Ambulatory Visit: Admitting: Physician Assistant

## 2024-02-07 ENCOUNTER — Encounter: Payer: Self-pay | Admitting: Physician Assistant

## 2024-02-07 VITALS — BP 137/92 | HR 75 | Ht 63.0 in | Wt 246.0 lb

## 2024-02-07 DIAGNOSIS — R351 Nocturia: Secondary | ICD-10-CM | POA: Diagnosis not present

## 2024-02-07 DIAGNOSIS — N3281 Overactive bladder: Secondary | ICD-10-CM | POA: Diagnosis not present

## 2024-02-07 LAB — BLADDER SCAN AMB NON-IMAGING

## 2024-02-07 NOTE — Progress Notes (Signed)
 02/07/2024 1:45 PM   Tina Pacheco [birth year] [id number]  CC: Chief Complaint  Patient presents with   Over Active Bladder   HPI: Tina Pacheco is a 73 y.o. female with PMH OAB wet, DDD, HFpEF on torsemide , and OSA not on CPAP who presents today for symptom recheck on Gemtesa .  She is accompanied today by her daughter, Tawni, who contributes to HPI.  Today she reports decreased frequency on Gemtesa , but still with urinary urgency and leakage, often without awareness.  Her nocturia is stable, and she remains off CPAP.  She takes Gemtesa  and torsemide  at the same time in the morning and notices significantly increased urinary frequency about an hour later.  She wonders if there is a pill I can give her to counteract this effect.  PVR 0 mL.  PMH: Past Medical History:  Diagnosis Date   (HFpEF) heart failure with preserved ejection fraction (HCC)    a.) TTE 08/10/2014: EF 55-60%, mild MAC, mild LAE, mild MR, PASP 38; b.) TTE 03/31/2019: EF 55-60%, mild LVH, mild-mod LAW, mod MAC, mild-mod AoV sclerosis, triv PR, G1DD; c.) TTE 05/29/2020: EF 60-65%, mild LVH, mod LAE, G1DD   Anemia    Aortic atherosclerosis    Asthma    Basal cell carcinoma    CAD (coronary artery disease)    a.) LHC 05/24/2010 - nonobstructive CAD - med mgmt; b.) LHC 06/04/2020: 30% dLAD, 40% o-mLAD - med mgmt   Chronic, continuous use of opioids    Claustrophobia    COPD (chronic obstructive pulmonary disease) (HCC)    DDD (degenerative disc disease), lumbosacral    Diuretic-induced hypokalemia    Dyslipidemia    Fibromyalgia    Frequent falls    GERD (gastroesophageal reflux disease)    Gouty arthropathy    Hemihypertrophy    History of bilateral cataract extraction 2018   Hyperplastic colonic polyp 2003   Hypertension    Long term current use of amiodarone     Long term current use of anticoagulant    a.) apixaban    Long term current use of immunosuppressive drug    a.) certolizumab pegol   +  predisone for RA diagnosis   MDD (major depressive disorder)    Migraines    Morbid obesity (HCC)    Nontraumatic complete tear of rotator cuff, left    OSA (obstructive sleep apnea)    a.) non-complaint with prescribed nocturnal PAP therapy   Osteoarthritis    PAF (paroxysmal atrial fibrillation) (HCC)    a.) CHA2DS2-VASc = 5 (age, sex, CHF, HTN, vascular disease history); b.) rate and rhythm maintained on oral amiodarone  + metoprolol  succinate; chronically anticoagulated with standard dose apixaban    Pneumonia    PONV (postoperative nausea and vomiting)    Prediabetes    Rheumatoid arthritis (HCC)    a.) on certolizumab pegol  + predisone   Stage 3 chronic kidney disease (HCC)     Surgical History: Past Surgical History:  Procedure Laterality Date   ABDOMINAL HYSTERECTOMY     APPLICATION OF WOUND VAC Right 08/09/2017   Procedure: APPLICATION OF WOUND VAC;  Surgeon: Lilli Cough, DPM;  Location: [facility];  Service: Podiatry;  Laterality: Right;   CARDIAC CATHETERIZATION  05/24/2010   nonobstructive CAD   CATARACT EXTRACTION W/ INTRAOCULAR LENS IMPLANT Right 06/12/2016   Dr. Evalene Raw   CATARACT EXTRACTION W/ INTRAOCULAR LENS IMPLANT Left 06/26/2016   Dr. Evalene Raw   CESAREAN SECTION     CHOLECYSTECTOMY     COLONOSCOPY  06/12/2011   Procedure: COLONOSCOPY;  Surgeon: Renaye Sous, MD;  Location: [facility];  Service: Endoscopy;  Laterality: N/A;   COLONOSCOPY N/A 03/17/2013   Procedure: COLONOSCOPY;  Surgeon: Renaye Sous, MD;  Location: [facility];  Service: Endoscopy;  Laterality: N/A;   FOOT ARTHRODESIS Right 07/13/2017   Procedure: ARTHRODESIS FOOT-MULTI.FUSIONS (6 JOINTS);  Surgeon: Lilli Cough, DPM;  Location: [facility];  Service: Podiatry;  Laterality: Right;   IRRIGATION AND DEBRIDEMENT FOOT Right 08/09/2017   Procedure: IRRIGATION AND DEBRIDEMENT FOOT;  Surgeon: Lilli Cough, DPM;  Location: [facility];  Service: Podiatry;  Laterality: Right;   KNEE  ARTHROSCOPY Bilateral    bilateral   LEFT HEART CATH AND CORONARY ANGIOGRAPHY N/A 06/04/2020   Procedure: LEFT HEART CATH AND CORONARY ANGIOGRAPHY;  Surgeon: Darron Deatrice LABOR, MD;  Location: [facility];  Service: Cardiovascular;  Laterality: N/A;   REVERSE SHOULDER ARTHROPLASTY Right 11/16/2020   Procedure: REVERSE SHOULDER ARTHROPLASTY;  Surgeon: Edie Norleen PARAS, MD;  Location: [facility];  Service: Orthopedics;  Laterality: Right;   REVERSE SHOULDER ARTHROPLASTY Left 10/03/2022   Procedure: REVERSE SHOULDER ARTHROPLASTY;  Surgeon: Edie Norleen PARAS, MD;  Location: [facility];  Service: Orthopedics;  Laterality: Left;   SHOULDER CLOSED REDUCTION Right 11/11/2020   Procedure: CLOSED REDUCTION SHOULDER;  Surgeon: Edie Norleen PARAS, MD;  Location: [facility];  Service: Orthopedics;  Laterality: Right;   SHOULDER CLOSED REDUCTION Left 10/02/2022   Procedure: CLOSED REDUCTION SHOULDER;  Surgeon: Lorelle Hussar, MD;  Location: [facility];  Service: Orthopedics;  Laterality: Left;   SHOULDER INJECTION Right 11/11/2020   Procedure: SHOULDER INJECTION;  Surgeon: Edie Norleen PARAS, MD;  Location: [facility];  Service: Orthopedics;  Laterality: Right;   SHOULDER SURGERY Right 08/2023   SINUSOTOMY     TOOTH EXTRACTION  12/2016   TOTAL SHOULDER REVISION Right 06/27/2022   Procedure: TOTAL SHOULDER REVISION;  Surgeon: Edie Norleen PARAS, MD;  Location: [facility];  Service: Orthopedics;  Laterality: Right;    Home Medications:  Allergies as of 02/07/2024       Reactions   Fluoxetine  Other (See Comments)   Headache, shaking, sleep issues Headache, shaking, sleep issues   Codeine    Nausea and vomiting/only when taking too much        Medication List        Accurate as of February 07, 2024  1:45 PM. If you have any questions, ask your nurse or doctor.          STOP taking these medications    Gemtesa  75 MG Tabs Generic drug: Vibegron  Stopped by: Lucie Hones       TAKE these medications     amiodarone  200 MG tablet Commonly known as: PACERONE  TAKE 1 TABLET(200 MG) BY MOUTH DAILY   apixaban  5 MG Tabs tablet Commonly known as: Eliquis  Take 1 tablet (5 mg total) by mouth 2 (two) times daily.   atorvastatin  40 MG tablet Commonly known as: LIPITOR TAKE 1 TABLET(40 MG) BY MOUTH DAILY   CIMZIA  [place] Inject into the skin. Patient states taking 1 x per month   docusate sodium  50 MG capsule Commonly known as: COLACE Take 50 mg by mouth daily.   leflunomide  20 MG tablet Commonly known as: ARAVA  Take 20 mg by mouth daily.   metolazone  2.5 MG tablet Commonly known as: ZAROXOLYN  TAKE 1 TABLET BY MOUTH DAILY AS NEEDED. MAY GIVE 1 TABLET AS NEEDED FOR WEIGHT GAIN OF 5 LB IN 1 WEEK   metoprolol  succinate 50 MG  24 hr tablet Commonly known as: TOPROL -XL TAKE 1 TABLET BY MOUTH EVERY DAY WITH OR IMMEDIATELY FOLLOWING A MEAL   mirabegron ER 50 MG Tb24 tablet Commonly known as: MYRBETRIQ Take 1 tablet (50 mg total) by mouth daily. Started by: Lucie Hones   montelukast  10 MG tablet Commonly known as: SINGULAIR  Take 1 tablet (10 mg total) by mouth at bedtime.   nystatin  powder Commonly known as: MYCOSTATIN /NYSTOP  Apply 1 Application topically 3 (three) times daily as needed.   Oxycodone  HCl 10 MG Tabs Take 0.5-1 tablets (5-10 mg total) by mouth 3 (three) times daily as needed.   polyethylene glycol 17 g packet Commonly known as: MIRALAX  / GLYCOLAX  Take 17 g by mouth daily as needed for mild constipation.   potassium chloride  SA 20 MEQ tablet Commonly known as: KLOR-CON  M Take 1 tablet (20 mEq total) by mouth daily.   predniSONE  5 MG tablet Commonly known as: DELTASONE  TAKE 1 TABLET(5 MG) BY MOUTH DAILY WITH BREAKFAST   senna-docusate 8.6-50 MG tablet Commonly known as: Senokot-S Take 2 tablets by mouth at bedtime.   spironolactone  25 MG tablet Commonly known as: ALDACTONE  TAKE 1 TABLET(25 MG) BY MOUTH DAILY   terazosin  10 MG capsule Commonly known  as: HYTRIN  TAKE 1 CAPSULE(10 MG) BY MOUTH AT BEDTIME   torsemide  20 MG tablet Commonly known as: DEMADEX  Take 1-2 tablets (20-40 mg total) by mouth daily. TAKES 2 TABLETS AS NEEDED        Allergies:  Allergies[1]  Family History: Family History  Problem Relation Age of Onset   Tuberculosis Mother    Parkinsonism Mother    Emphysema Father        smoked   Arthritis Father    Cancer Sister    Diabetes type II Sister    Breast cancer Sister    Tuberculosis Sister    Breast cancer Maternal Aunt    Colon cancer Neg Hx    Esophageal cancer Neg Hx    Pancreatic cancer Neg Hx    Stomach cancer Neg Hx     Social History:   reports that she has never smoked. She has been exposed to tobacco smoke. She has never used smokeless tobacco. She reports that she does not drink alcohol and does not use drugs.  Physical Exam: BP (!) 137/92   Pulse 75   Ht 5' 3 (1.6 m)   Wt 246 lb (111.6 kg)   BMI 43.58 kg/m   Constitutional:  Alert and oriented, no acute distress, nontoxic appearing HEENT: , AT Cardiovascular: No clubbing, cyanosis, or edema Respiratory: Normal respiratory effort, no increased work of breathing Skin: No rashes, bruises or suspicious lesions Neurologic: Grossly intact, no focal deficits, moving all 4 extremities Psychiatric: Normal mood and affect  Laboratory Data: Results for orders placed or performed in visit on 02/07/24  BLADDER SCAN AMB NON-IMAGING   Collection Time: 02/07/24  9:38 AM  Result Value Ref Range   Scan Result 0ml    *Note: Due to a large number of results and/or encounters for the requested time period, some results have not been displayed. A complete set of results can be found in Results Review.   Assessment & Plan:   1. Overactive bladder (Primary) Improved frequency on Gemtesa , though she is not yet at her treatment goal.  We need to have reasonable treatment goals.  We discussed that I do not have a pill to counteract the effect of  torsemide , and her increased urinary frequency means that the  torsemide  is achieving its therapeutic goal.  We discussed options including continuing Gemtesa , trial of Myrbetriq as an alternative, consideration of antimuscarinics, or nonpharmacologic treatment options.  She prefers to stick with pharmacotherapy for now and would like to try Myrbetriq. - BLADDER SCAN AMB NON-IMAGING - mirabegron ER (MYRBETRIQ) 50 MG TB24 tablet; Take 1 tablet (50 mg total) by mouth daily.  Dispense: 30 tablet; Refill: 11  2. Nocturia Will try Myrbetriq as above.  We discussed the role of unmanaged OSA and nocturia and I encouraged her to use her CPAP. - mirabegron ER (MYRBETRIQ) 50 MG TB24 tablet; Take 1 tablet (50 mg total) by mouth daily.  Dispense: 30 tablet; Refill: 11  Return in about 6 weeks (around 03/20/2024) for Symptom recheck with PVR.  Lucie Hones, PA-C  [facility] [place] [address] [place], [place] [address] [phone]     [1]  Allergies Allergen Reactions   Fluoxetine  Other (See Comments)    Headache, shaking, sleep issues Headache, shaking, sleep issues   Codeine     Nausea and vomiting/only when taking too much

## 2024-03-04 ENCOUNTER — Ambulatory Visit: Admitting: Nurse Practitioner

## 2024-03-04 ENCOUNTER — Other Ambulatory Visit: Payer: Self-pay | Admitting: Nurse Practitioner

## 2024-03-04 NOTE — Telephone Encounter (Unsigned)
 Copied from CRM [id number]. Topic: Clinical - Medication Refill >> Mar 04, 2024  8:50 AM Berneda FALCON wrote: Medication:  Oxycodone  HCl 10 MG TABS    Has the patient contacted their pharmacy? Yes (Agent: If no, request that the patient contact the pharmacy for the refill. If patient does not wish to contact the pharmacy document the reason why and proceed with request.) (Agent: If yes, when and what did the pharmacy advise?)  This is the patient's preferred pharmacy:  [id number] - [place], [place] - [address] AT [facility] OF [address] [facility] [address] [place] [address] Phone: [phone] Fax: [phone]  Is this the correct pharmacy for this prescription? Yes If no, delete pharmacy and type the correct one.   Has the prescription been filled recently? No  Is the patient out of the medication? No  Has the patient been seen for an appointment in the last year OR does the patient have an upcoming appointment? Yes  Can we respond through MyChart? Yes  Agent: Please be advised that Rx refills may take up to 3 business days. We ask that you follow-up with your pharmacy.

## 2024-03-05 ENCOUNTER — Other Ambulatory Visit: Payer: Self-pay | Admitting: Cardiovascular Disease

## 2024-03-05 DIAGNOSIS — I480 Paroxysmal atrial fibrillation: Secondary | ICD-10-CM

## 2024-03-05 MED ORDER — OXYCODONE HCL 10 MG PO TABS: 5.0000 mg | ORAL_TABLET | Freq: Three times a day (TID) | ORAL | 0 refills | Status: DC | PRN

## 2024-03-05 NOTE — Telephone Encounter (Signed)
 Copied from CRM [id number]. Topic: General - Other >> Mar 04, 2024  4:27 PM Jayma L wrote: Reason for CRM: patient called in asking for a update on refill for oxy , advised it was being worked on , that it takes up too 3 days and I was unable to rush the refill. Patient said her husband needs to be able to pick them all up at once

## 2024-03-05 NOTE — Telephone Encounter (Signed)
 Called patient let know was sent in today. She will go by and get picked up.

## 2024-03-05 NOTE — Telephone Encounter (Signed)
 Patient much need a follow-up appointment for further refills. Thank

## 2024-03-05 NOTE — Telephone Encounter (Signed)
 Copied from CRM [id number]. Topic: Clinical - Medication Question >> Mar 05, 2024 12:57 PM Fonda T wrote: Reason for CRM: Pt is calling to check status of medication refill request on Oxycodone  HCl 10 MG TABS.  Informed pt per med refill protocol, medication refills take up to 3 business days to process. Also advised request has been sent to provider.  Pt is requesting a follow up call once this has been completed.  Pt is requesting this be done soon, as she has to arrange for medication to be picked up.  Can be reached at [phone].  Pt is aware of same day follow up call.   Preferred pharmacy: [id number] [place], [place] - [address] AT [facility] OF [address] [facility] [address] [place] [address] Phone: [phone] Fax: [phone]

## 2024-03-21 ENCOUNTER — Ambulatory Visit: Admitting: Physician Assistant

## 2024-03-21 ENCOUNTER — Ambulatory Visit: Admitting: Nurse Practitioner

## 2024-03-21 VITALS — BP 142/88 | HR 54 | Temp 97.9°F | Ht 63.0 in | Wt 240.0 lb

## 2024-03-21 DIAGNOSIS — M25511 Pain in right shoulder: Secondary | ICD-10-CM

## 2024-03-21 DIAGNOSIS — F1120 Opioid dependence, uncomplicated: Secondary | ICD-10-CM | POA: Diagnosis not present

## 2024-03-21 DIAGNOSIS — M353 Polymyalgia rheumatica: Secondary | ICD-10-CM | POA: Diagnosis not present

## 2024-03-21 DIAGNOSIS — M6289 Other specified disorders of muscle: Secondary | ICD-10-CM

## 2024-03-21 DIAGNOSIS — M069 Rheumatoid arthritis, unspecified: Secondary | ICD-10-CM | POA: Diagnosis not present

## 2024-03-21 DIAGNOSIS — G8929 Other chronic pain: Secondary | ICD-10-CM

## 2024-03-21 MED ORDER — METHOCARBAMOL 500 MG PO TABS: 500.0000 mg | ORAL_TABLET | Freq: Two times a day (BID) | ORAL | 0 refills | Status: AC | PRN

## 2024-03-21 NOTE — Progress Notes (Signed)
 "  Established Patient Office Visit  Subjective   Patient ID: Tina Pacheco, female    DOB: [birth year]  Age: 74 y.o. MRN: [id number]  Chief Complaint  Patient presents with   Follow-up    Pt complains of chronic pain in right shoulder. States the pain is increasing and she would like medication.     HPI  Discussed the use of AI scribe software for clinical note transcription with the patient, who gave verbal consent to proceed.  History of Present Illness Tina Pacheco is a 74 year old female with rheumatoid arthritis who presents with worsening shoulder pain.  She has a history of shoulder surgeries, with the most recent evaluation in December showing satisfactory x-ray results. Over the past month, she has experienced increased pain in her shoulders, particularly in the shoulder that was not cemented during surgery. In early January, she was told by her surgeon that her shoulder might require another surgical intervention due to movement issues.  The pain is persistent and affects her ability to sleep, requiring her to sleep sitting in a chair. It is exacerbated by movement, particularly inversion movements, and she experiences a sensation of something in my back' with pain radiating from the shoulder to the elbow and back. She has been using ice and a sling for relief but finds it difficult to move with the sling.  Her current medication regimen includes prednisone  and an infusion therapy for rheumatoid arthritis, having switched from Cimzia  to Orencia . She cannot take ibuprofen due to kidney concerns and reports that cyclobenzaprine , a muscle relaxant, has not been effective. She is also on Colace for bowel regularity, which she takes daily.  She has lost significant weight over the past couple of years, approximately 130 pounds, and is managing fluid intake due to heart concerns. Her blood pressure has improved since November. No fever or chills, and her bowel movements are regular  with the use of Colace, although her intake varies with her appetite.       Review of Systems  Constitutional:  Negative for chills and fever.  Respiratory:  Negative for shortness of breath.   Cardiovascular:  Negative for chest pain.  Gastrointestinal:  Negative for abdominal pain and constipation.  Musculoskeletal:  Positive for joint pain and myalgias.  Psychiatric/Behavioral:  Negative for hallucinations and suicidal ideas.       Objective:     BP (!) 142/88   Pulse (!) 54   Temp 97.9 F (36.6 C) (Oral)   Ht 5' 3 (1.6 m)   Wt 240 lb (108.9 kg)   SpO2 96%   BMI 42.51 kg/m  BP Readings from Last 3 Encounters:  03/21/24 (!) 142/88  02/07/24 (!) 137/92  01/02/24 (!) 189/78   Wt Readings from Last 3 Encounters:  03/21/24 240 lb (108.9 kg)  02/07/24 246 lb (111.6 kg)  01/02/24 228 lb (103.4 kg)   SpO2 Readings from Last 3 Encounters:  03/21/24 96%  12/18/23 92%  08/28/23 95%      Physical Exam Vitals and nursing note reviewed.  Constitutional:      Appearance: Normal appearance. She is obese.     Comments: In a wheelchair  Cardiovascular:     Rate and Rhythm: Normal rate and regular rhythm.     Heart sounds: Normal heart sounds.  Pulmonary:     Effort: Pulmonary effort is normal.     Breath sounds: Normal breath sounds.  Neurological:     Mental Status: She is alert.  No results found for any visits on 03/21/24.    The ASCVD Risk score (Arnett DK, et al., 2019) failed to calculate for the following reasons:   Risk score cannot be calculated because patient has a medical history suggesting prior/existing ASCVD   * - Cholesterol units were assumed    Assessment & Plan:   Problem List Items Addressed This Visit       Musculoskeletal and Integument   Rheumatoid arthritis (HCC)   Relevant Medications   methocarbamol  (ROBAXIN ) 500 MG tablet     Other   Narcotic dependence (HCC)   Polymyalgia rheumatica   Other Visit Diagnoses        Muscle tightness    -  Primary   Relevant Medications   methocarbamol  (ROBAXIN ) 500 MG tablet     Chronic right shoulder pain       Relevant Medications   methocarbamol  (ROBAXIN ) 500 MG tablet      Assessment and Plan Assessment & Plan 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 Previous treatments with cyclobenzaprine  were ineffective. Baclofen considered due to its lower risk of drowsiness. - Prescribed methocarbamol  as a muscle relaxant.  Sedation precautions reviewed - Continue current pain management regimen. - Reassess in three months or sooner if adverse reactions occur.  Rheumatoid arthritis Managed with Orencia  infusion, replacing previous Cimzia  treatment. Continues to see rheumatologist for ongoing management. - Continue Orencia  infusion as per rheumatologist's recommendation.  Chronic pain syndrome with opioid dependence Chronic pain syndrome with opioid dependence. Pain exacerbated by shoulder issues and limited mobility. - Continue current pain management regimen with oxycodone  as needed.  Stage 3b chronic kidney disease Limits the use of certain medications, such as NSAIDs, due to potential renal impact. - Avoid NSAIDs due to kidney function concerns.  Hypertension Continue current antihypertensive regimen.   Return in about 3 months (around 06/19/2024) for Med recheck.    Adina Crandall, NP  "

## 2024-03-21 NOTE — Patient Instructions (Signed)
 Nice to see you today  I want to see you in 3 months, sooner if you need me  I have sent a muscle relaxer to the pharmacy. It may cause drowsiness, so use caution

## 2024-03-31 ENCOUNTER — Telehealth: Payer: Self-pay | Admitting: Nurse Practitioner

## 2024-03-31 MED ORDER — OXYCODONE HCL 10 MG PO TABS: 5.0000 mg | ORAL_TABLET | Freq: Three times a day (TID) | ORAL | 0 refills | Status: AC | PRN

## 2024-03-31 NOTE — Addendum Note (Signed)
 Addended by: WENDEE LYNWOOD HERO on: 03/31/2024 01:14 PM   Modules accepted: Orders

## 2024-06-20 ENCOUNTER — Ambulatory Visit: Admitting: Nurse Practitioner

## 2024-09-05 ENCOUNTER — Encounter: Admitting: Nurse Practitioner

## 2024-09-05 ENCOUNTER — Ambulatory Visit
